# Patient Record
Sex: Female | Born: 1980 | Hispanic: Yes | Marital: Single | Smoking: Never smoker
Health system: Southern US, Community
[De-identification: ages and names within clinical notes are randomized; demographics above are authoritative.]

## PROBLEM LIST (undated history)

## (undated) DIAGNOSIS — D849 Immunodeficiency, unspecified: Secondary | ICD-10-CM

## (undated) DIAGNOSIS — K529 Noninfective gastroenteritis and colitis, unspecified: Secondary | ICD-10-CM

## (undated) DIAGNOSIS — Z800 Family history of malignant neoplasm of digestive organs: Secondary | ICD-10-CM

## (undated) DIAGNOSIS — F419 Anxiety disorder, unspecified: Secondary | ICD-10-CM

## (undated) DIAGNOSIS — R42 Dizziness and giddiness: Secondary | ICD-10-CM

## (undated) DIAGNOSIS — C801 Malignant (primary) neoplasm, unspecified: Secondary | ICD-10-CM

## (undated) DIAGNOSIS — U071 COVID-19: Secondary | ICD-10-CM

## (undated) DIAGNOSIS — C50919 Malignant neoplasm of unspecified site of unspecified female breast: Secondary | ICD-10-CM

## (undated) HISTORY — DX: Immunodeficiency, unspecified: D84.9

## (undated) HISTORY — DX: Malignant neoplasm of unspecified site of unspecified female breast: C50.919

## (undated) HISTORY — DX: Anxiety disorder, unspecified: F41.9

## (undated) HISTORY — DX: COVID-19: U07.1

## (undated) HISTORY — DX: Family history of malignant neoplasm of digestive organs: Z80.0

## (undated) HISTORY — DX: Noninfective gastroenteritis and colitis, unspecified: K52.9

---

## 2004-05-24 ENCOUNTER — Observation Stay: Payer: Self-pay

## 2004-05-24 IMAGING — US US OB US >=[ID] SNGL FETUS
1 series · 14 of 26 positions shown · non-contrast
Comparison: none

REASON FOR EXAM: Sizes Less Than  dates  Call Report [PHONE_NUMBER]
COMMENTS:

[Series 1: us ob us >=(id) sngl fetus · 0.33mm/px · 14 of 26 slices shown]
[im 1/26]
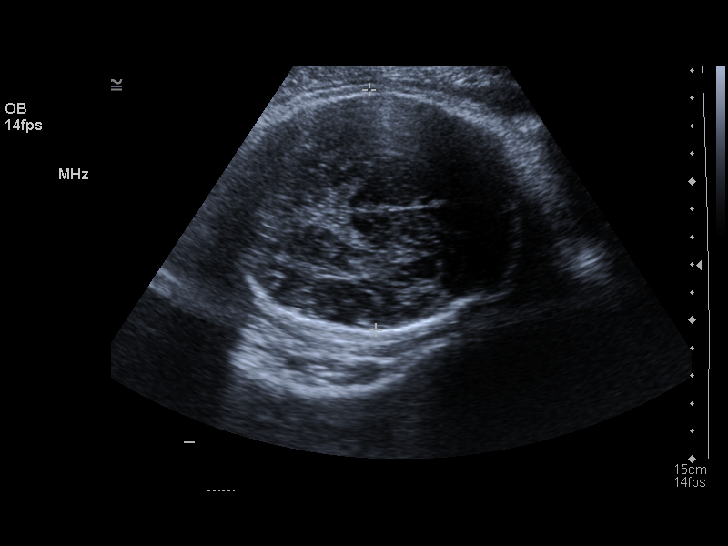
[im 3/26]
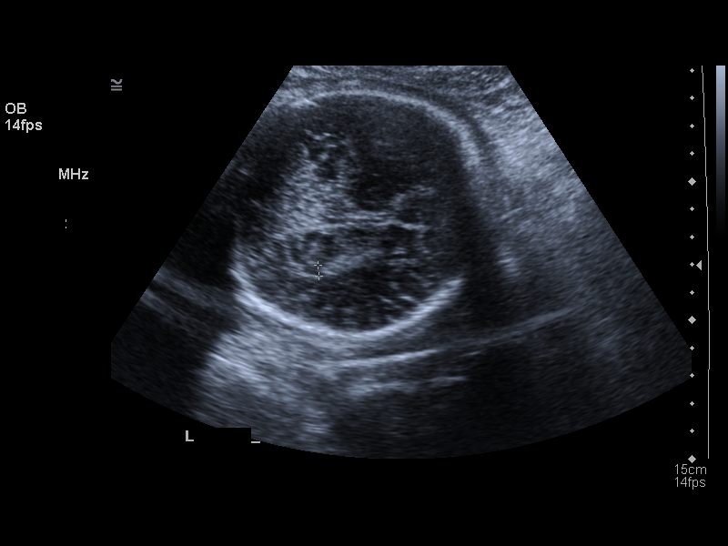
[im 5/26]
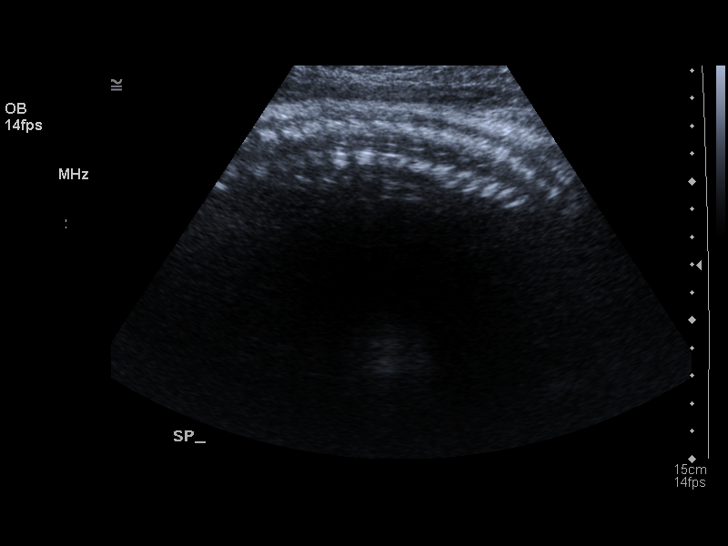
[im 7/26]
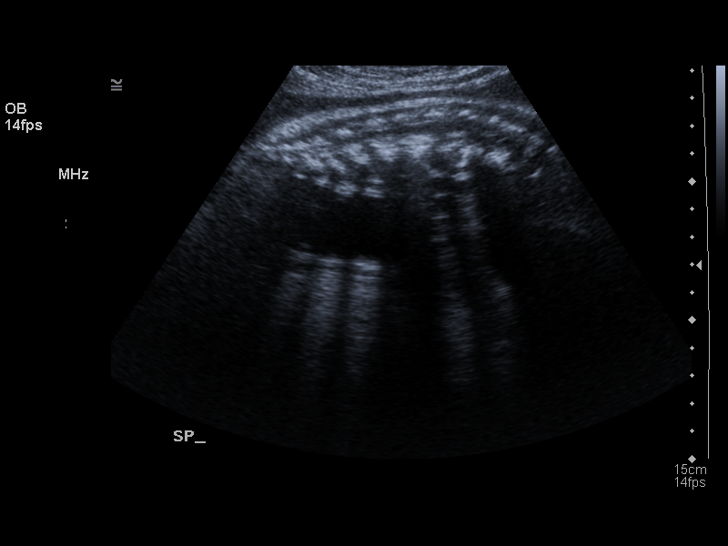
[im 9/26]
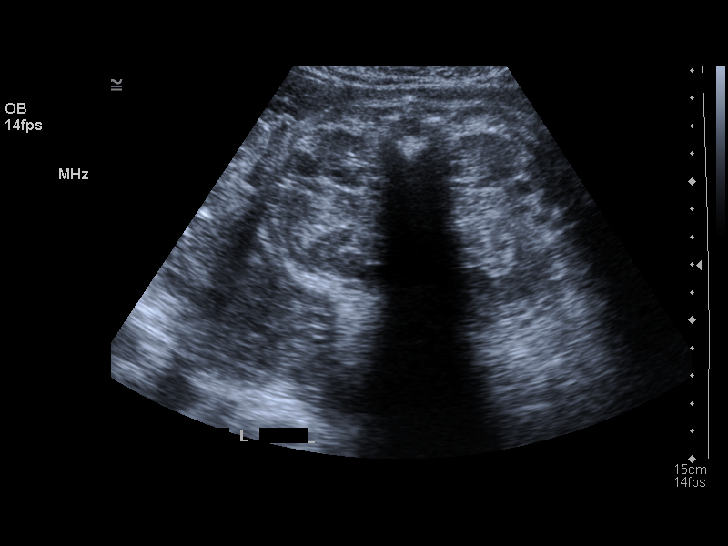
[im 11/26]
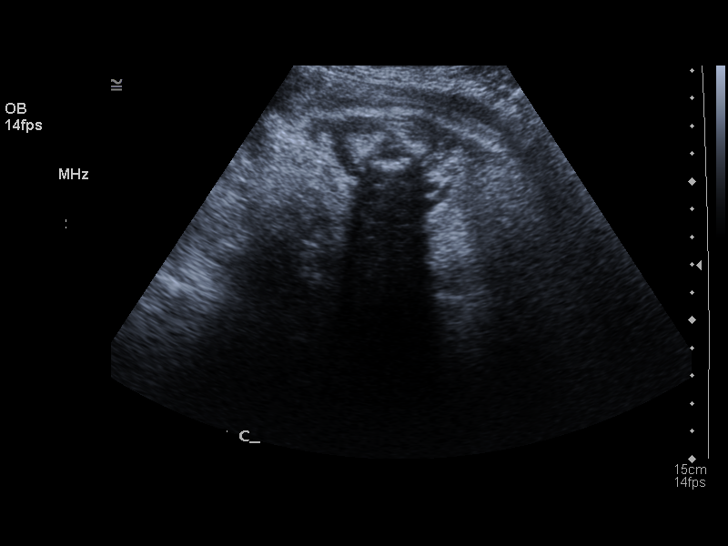
[im 13/26]
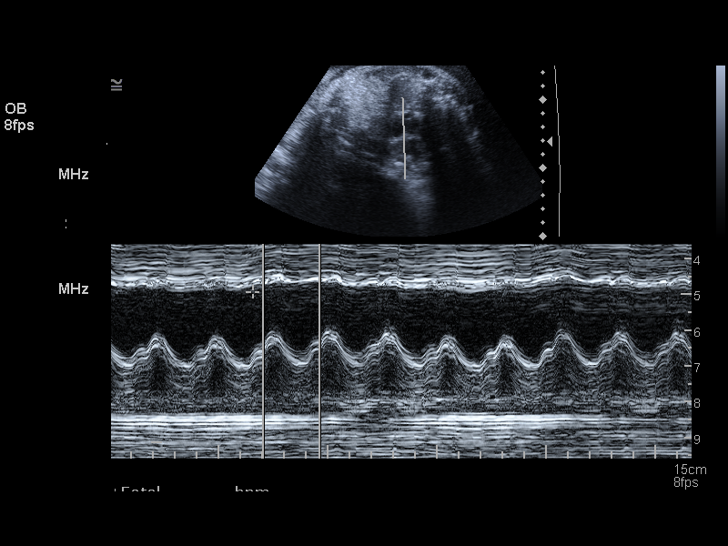
[im 14/26]
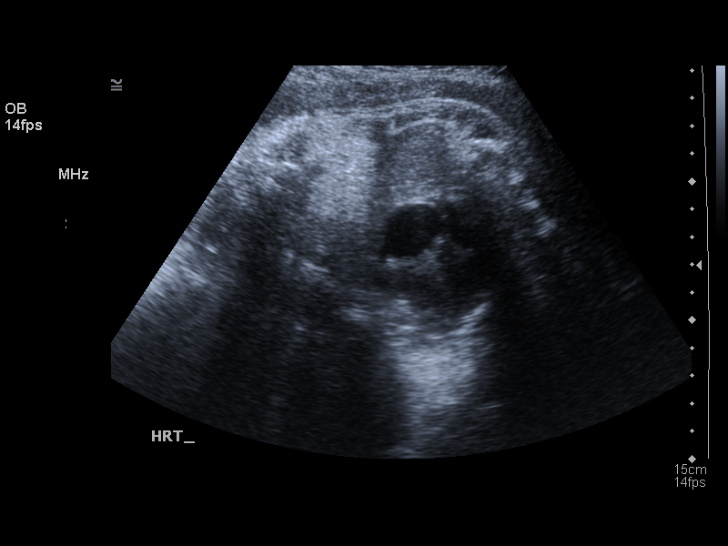
[im 16/26]
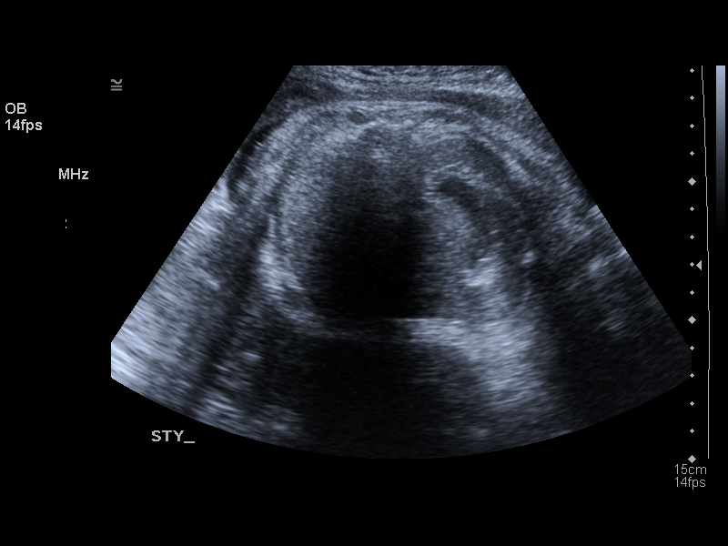
[im 18/26]
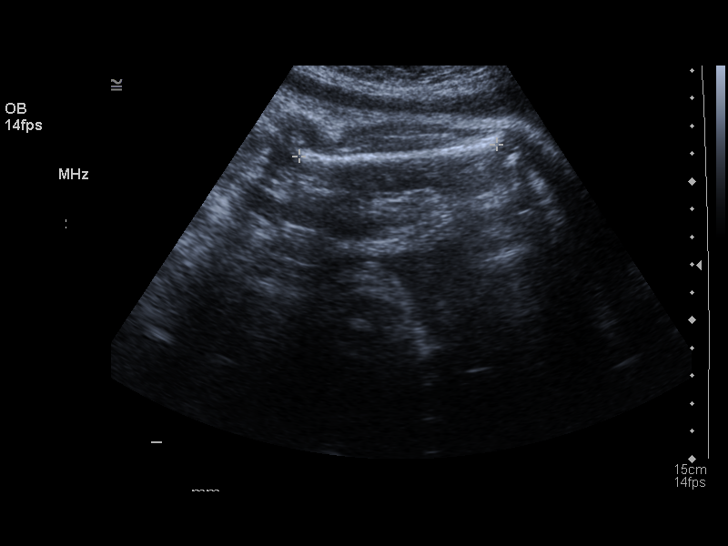
[im 20/26]
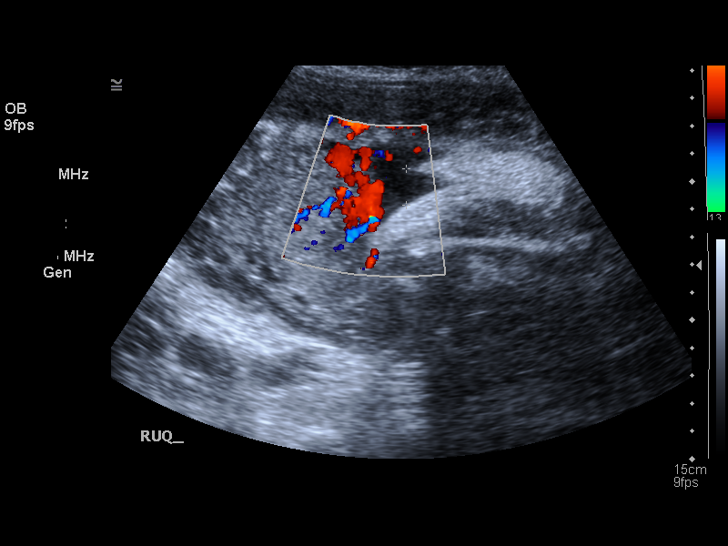
[im 22/26]
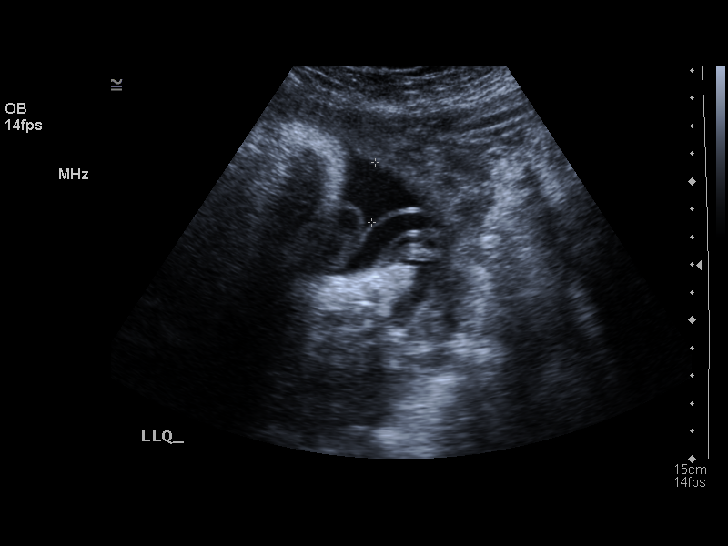
[im 24/26]
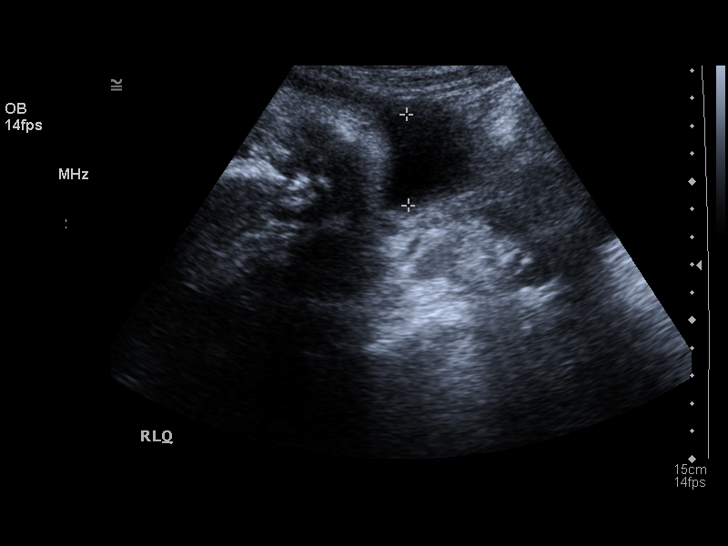
[im 26/26]
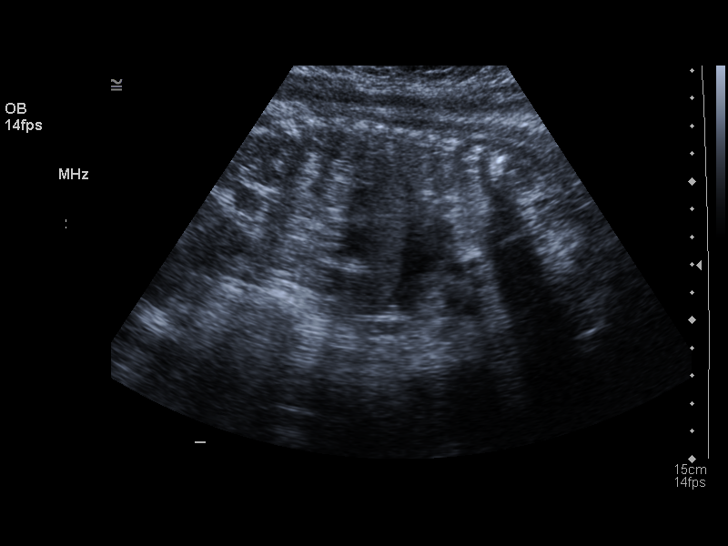

[14 of 26 positions shown; findings below may reference images not displayed]

PROCEDURE:     US  - US PREGNANCY / FETAL AGE  - [DATE]  [DATE]

RESULT:      Real-time imaging was obtained.   A single viable intrauterine
fetus in vertex presentation is identified.  Fetal cardiac motion is noted.
 Fetal heart rate of 116 beats per minute. Ultrasound age is 35 weeks 5
days.
BPD     86.7 mm     35 weeks
HC          309.5 mm     36 weeks
AC          312.6 mm     35 weeks 1 day
FL           71.3 mm     36 weeks 4 days

AFI is 10.6.  The fetal bladder, kidneys, stomach, four-chambered heart and
diaphragm are identified.  The placenta is located fundally and does not
extend to the lower uterine segment.
IMPRESSION: Single viable intrauterine fetus of 35 weeks 5 days.

## 2004-05-27 ENCOUNTER — Observation Stay: Payer: Self-pay | Admitting: Obstetrics and Gynecology

## 2004-05-28 ENCOUNTER — Inpatient Hospital Stay: Payer: Self-pay | Admitting: Obstetrics and Gynecology

## 2006-03-19 ENCOUNTER — Inpatient Hospital Stay: Payer: Self-pay | Admitting: Certified Nurse Midwife

## 2010-04-15 ENCOUNTER — Ambulatory Visit: Payer: Self-pay | Admitting: Advanced Practice Midwife

## 2010-04-15 IMAGING — US US OB US >=[ID] SNGL FETUS
1 series · 17 of 28 positions shown · non-contrast
Comparison: none

REASON FOR EXAM: dating
COMMENTS:

[Series 1: us ob us >=(id) sngl fetus · 17 of 61 slices shown]
[im 1/61]
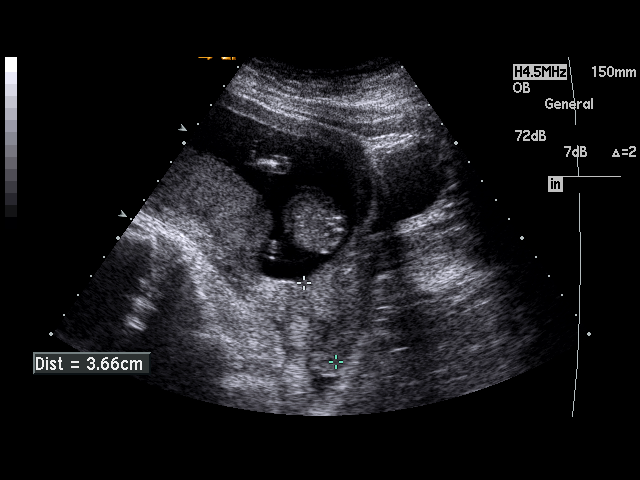
[im 5/61]
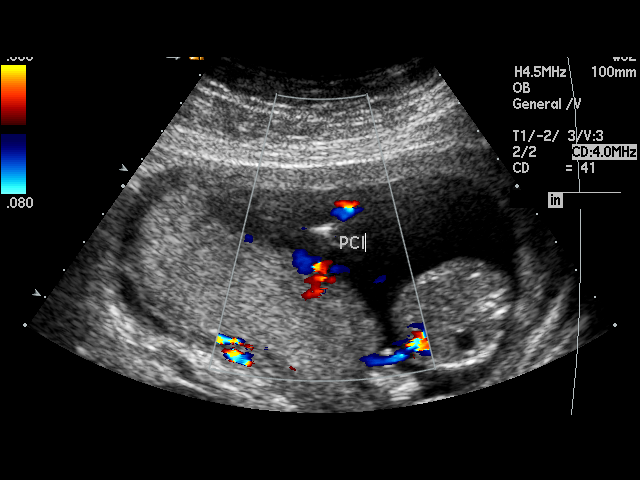
[im 9/61]
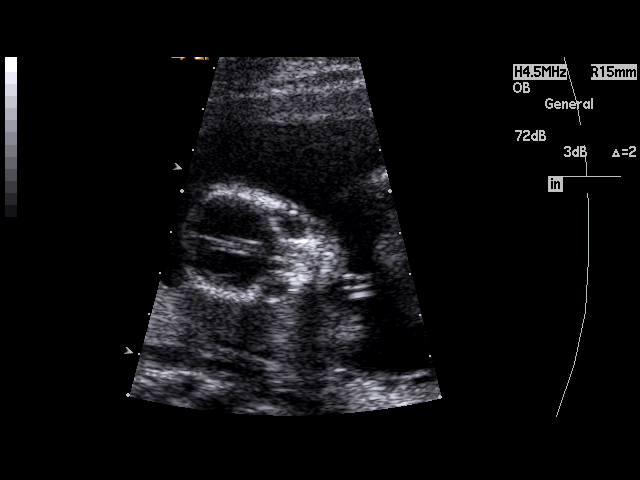
[im 12/61]
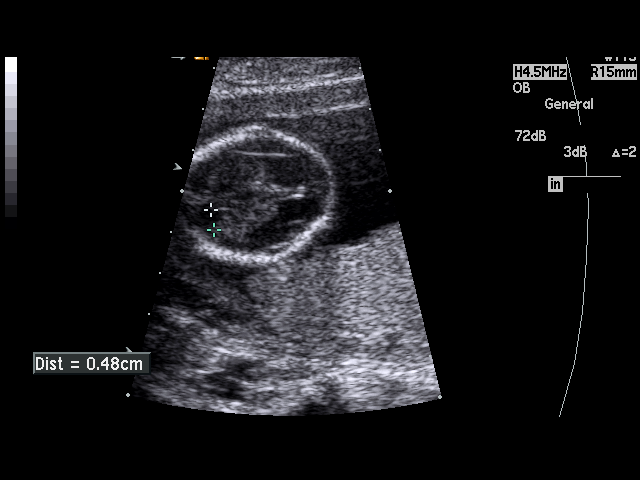
[im 16/61]
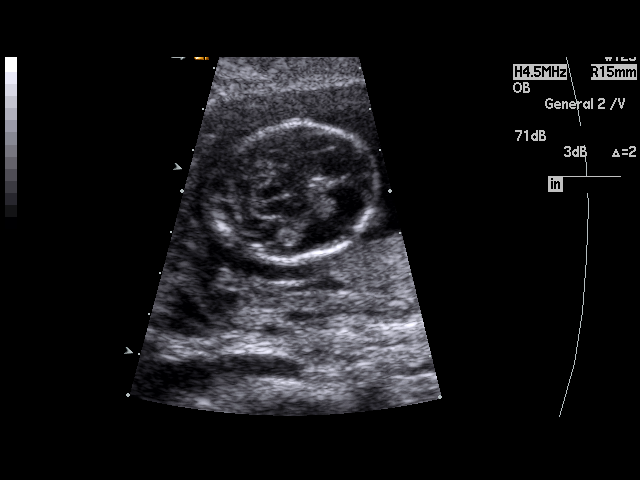
[im 21/61]
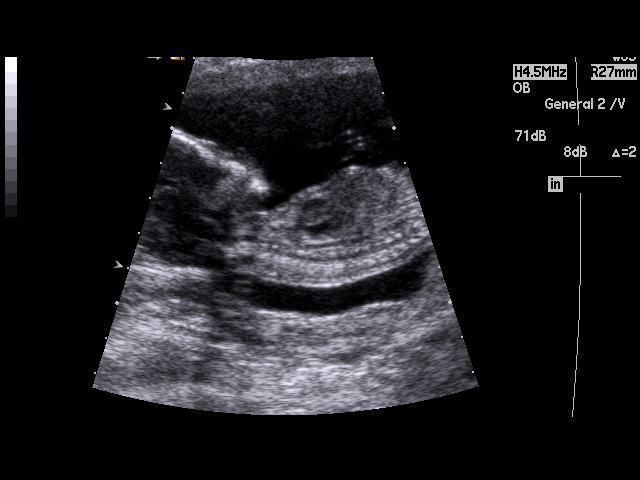
[im 23/61]
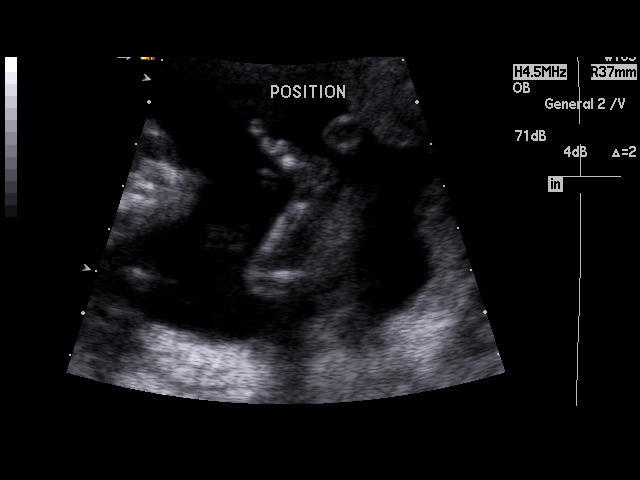
[im 27/61]
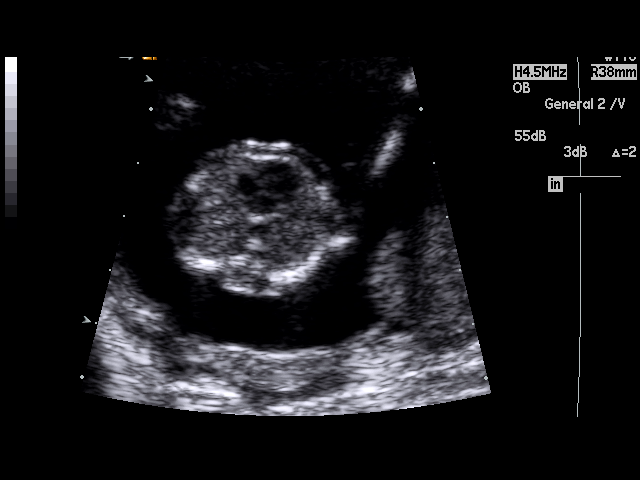
[im 32/61]
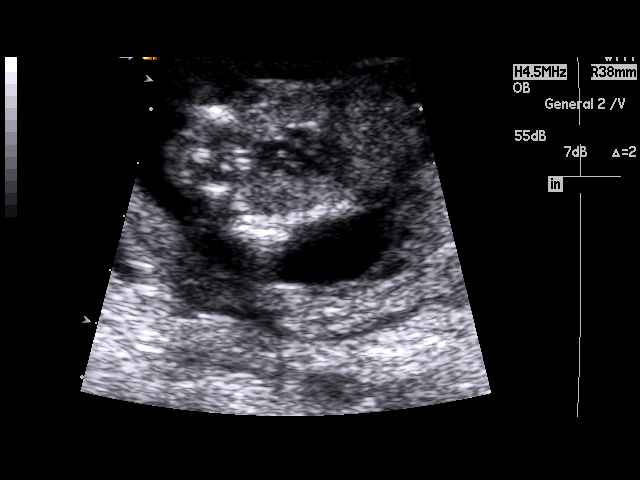
[im 34/61]
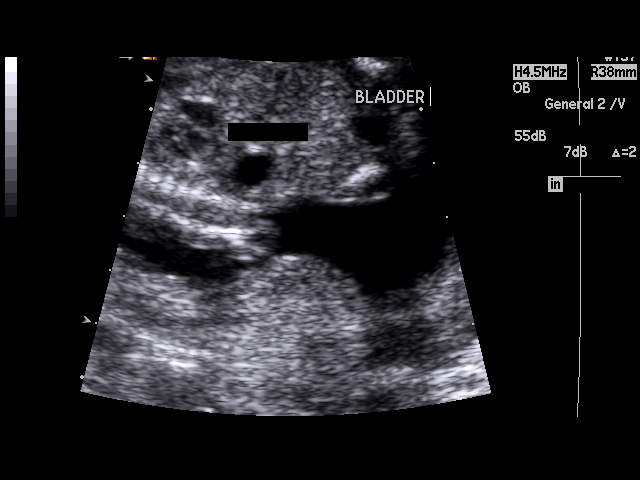
[im 38/61]
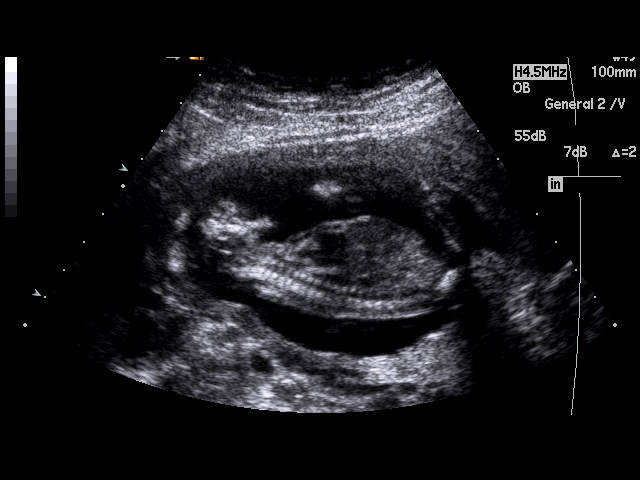
[im 41/61]
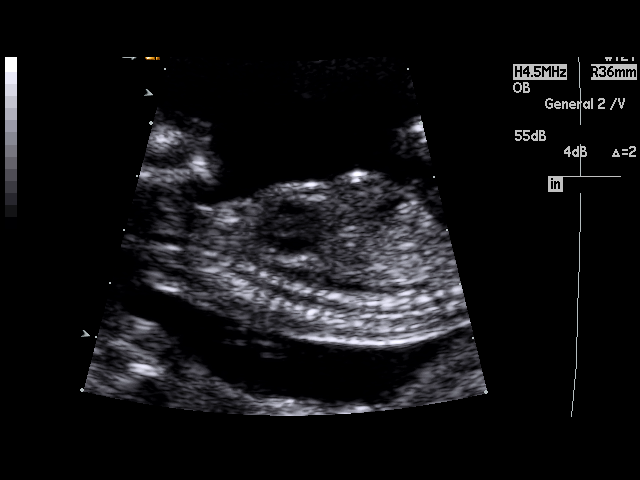
[im 45/61]
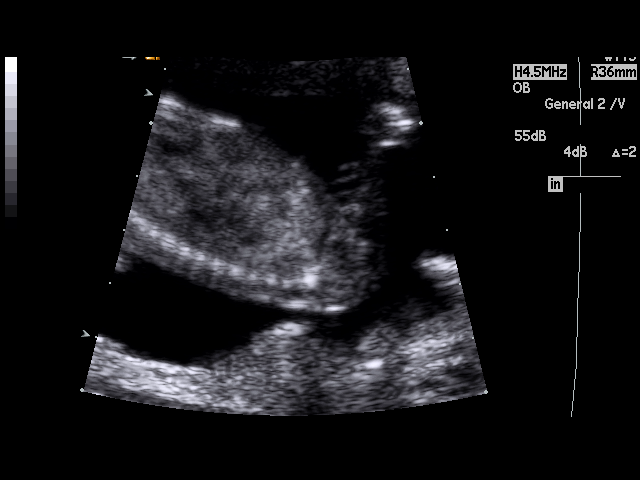
[im 49/61]
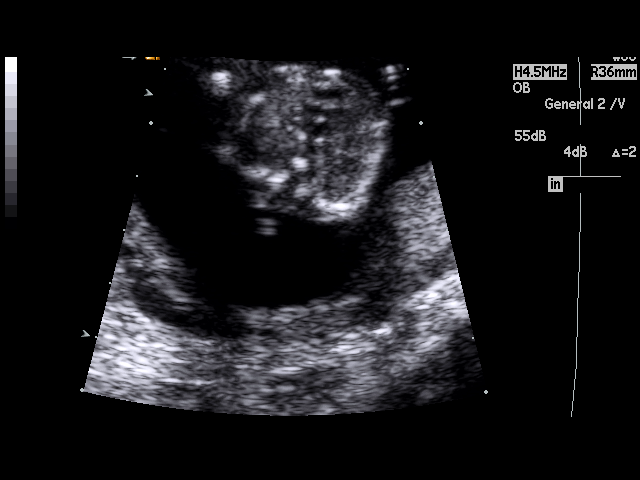
[im 52/61]
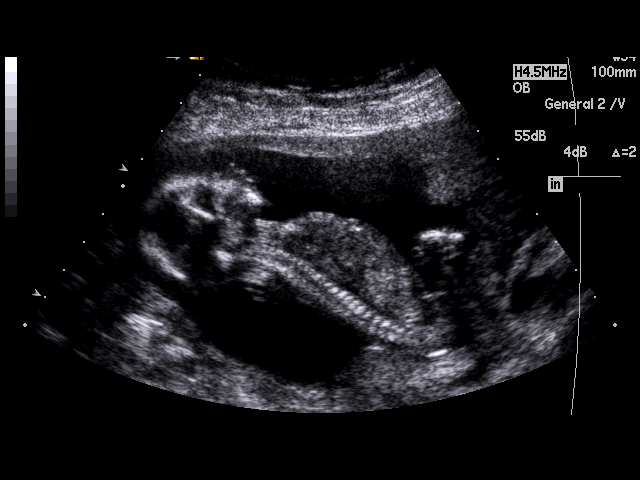
[im 56/61]
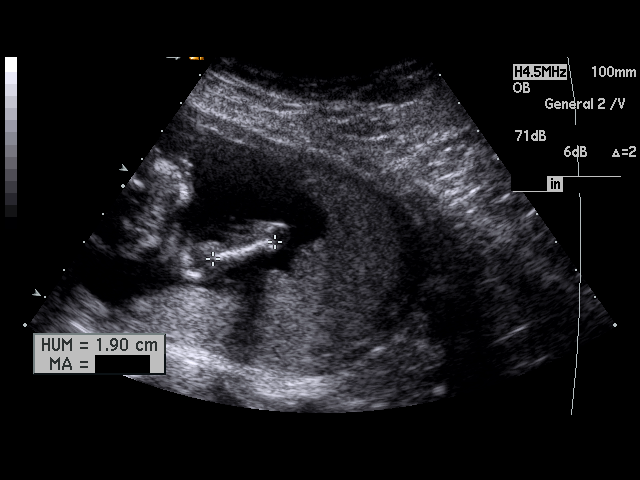
[im 61/61]
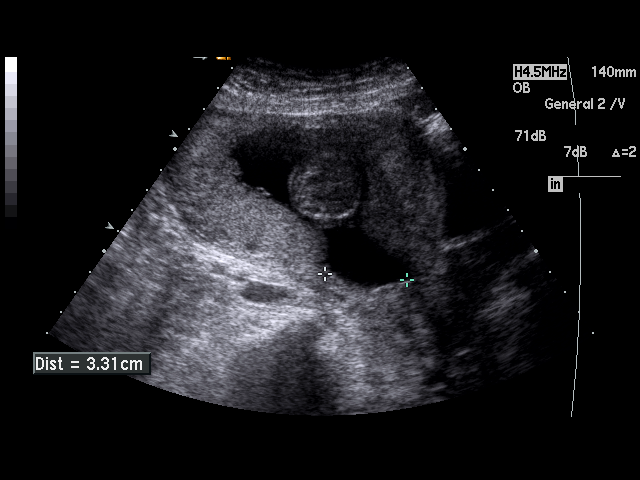

[17 of 28 positions shown; findings below may reference images not displayed]

PROCEDURE:     US  - US OB GREATER/OR EQUAL TO [RB]  - [DATE]  [DATE]

RESULT:     There is observed a single living intrauterine gestation.
Presentation is variable during the course of this exam. The placenta is
posterior. The inferior tip of the placenta is approximately 3.31 cm above
the cervix. Fetal cardiac activity was monitored at 147 beats per minute.
Amniotic fluid volume appears normal. The fetal heart, stomach and urinary
bladder are visualized. No hydrocephalus or hydronephrosis is seen. The
kidneys are too small to be evaluated at this time. Fetal measurements are
as follows:

BPD     3.24 cm      Corresponding to 16 weeks 1 day.
HC         12.34 cm      Corresponding to 16 weeks 1 day.
AC        10.17 cm      Corresponding to 16 weeks 1 day.
FL           1.8 cm      Corresponding to 15 weeks 2 days.
HL           1.9 cm      Corresponding to 15 weeks 4 days.

EFW is 257 grams + / - 21 grams. Average ultrasound age is 15 weeks 6 days.
Ultrasound EDD is [DATE].
IMPRESSION: Please see above.

## 2010-05-05 ENCOUNTER — Encounter: Payer: Self-pay | Admitting: Obstetrics & Gynecology

## 2010-05-05 IMAGING — US US OB DETAIL+14 WK - NRPT [facility]
1 series · 14 of 28 positions shown · non-contrast
Comparison: none

[Series 1: us ob detail+14 wk - nrpt [facility] · 14 of 91 slices shown]
[im 4/91]
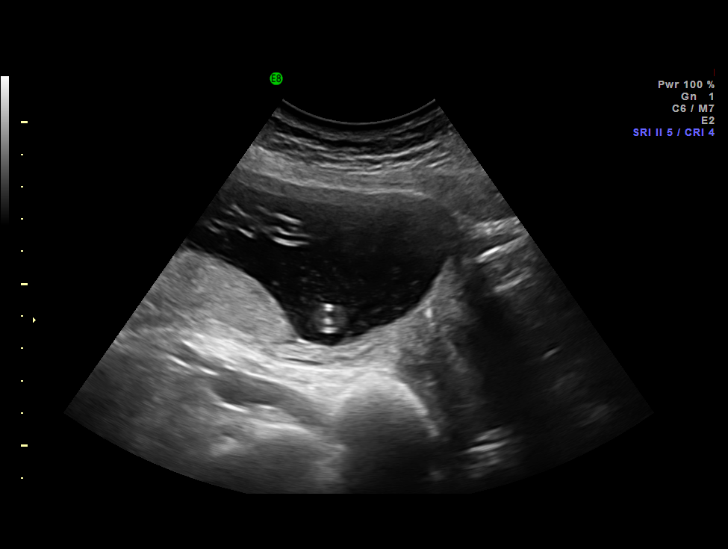
[im 11/91]
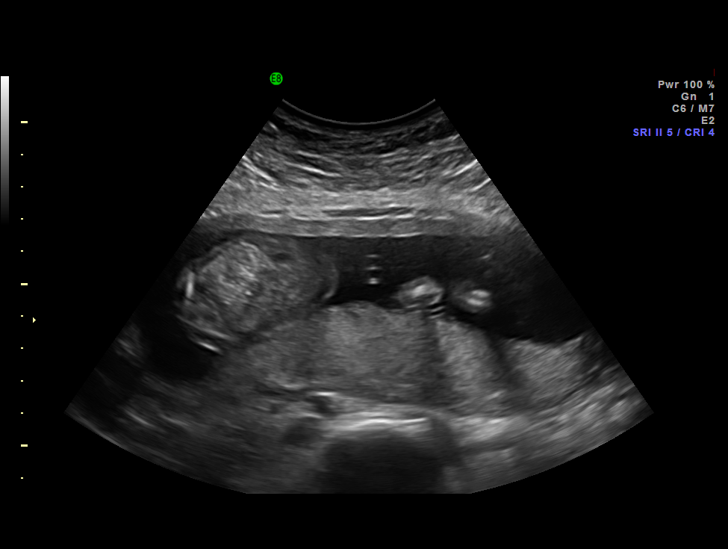
[im 17/91]
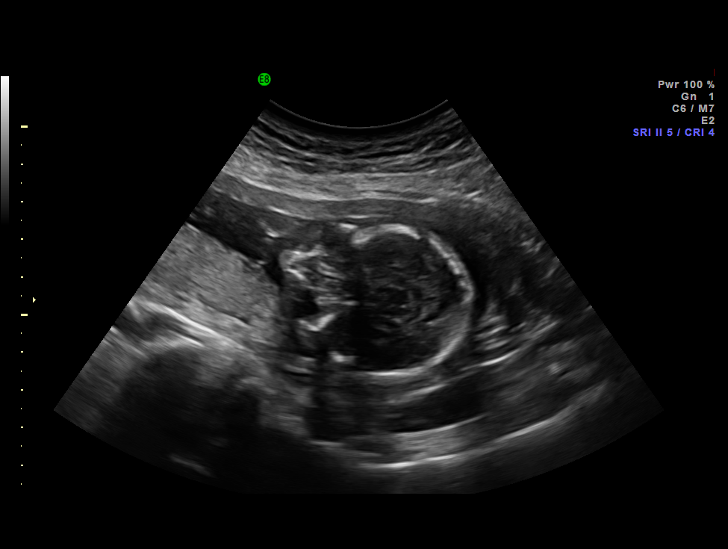
[im 24/91]
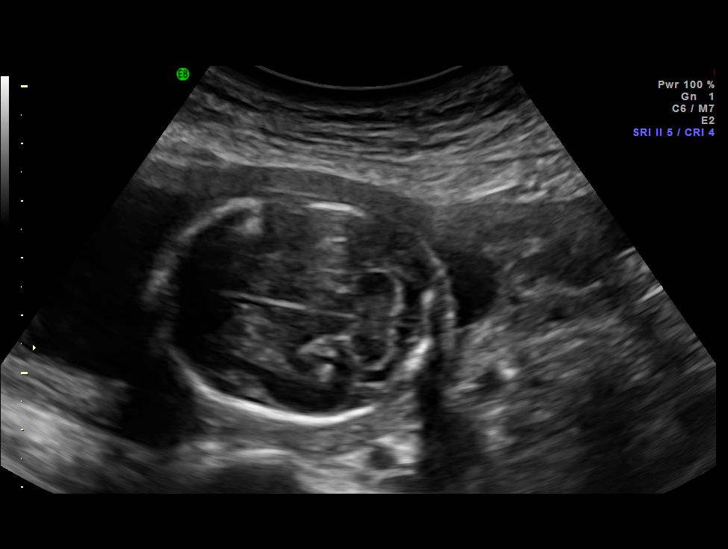
[im 31/91]
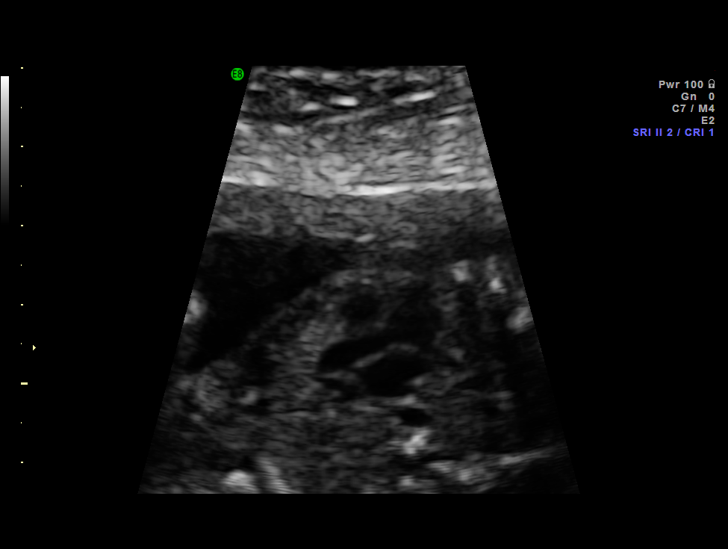
[im 37/91]
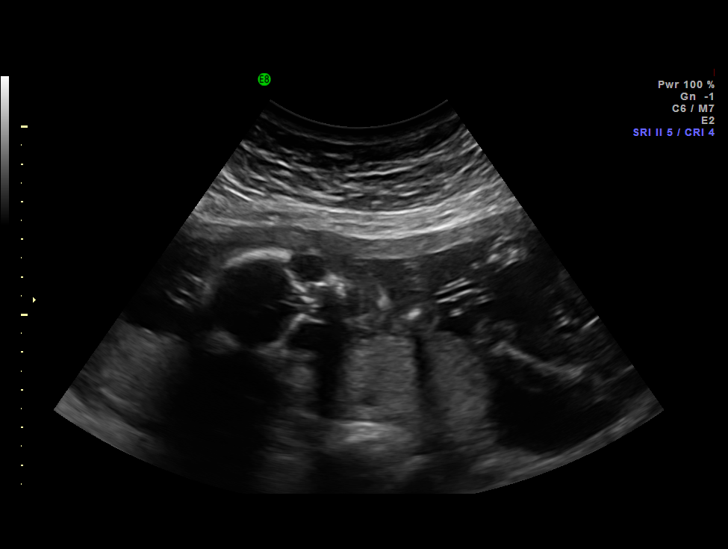
[im 44/91]
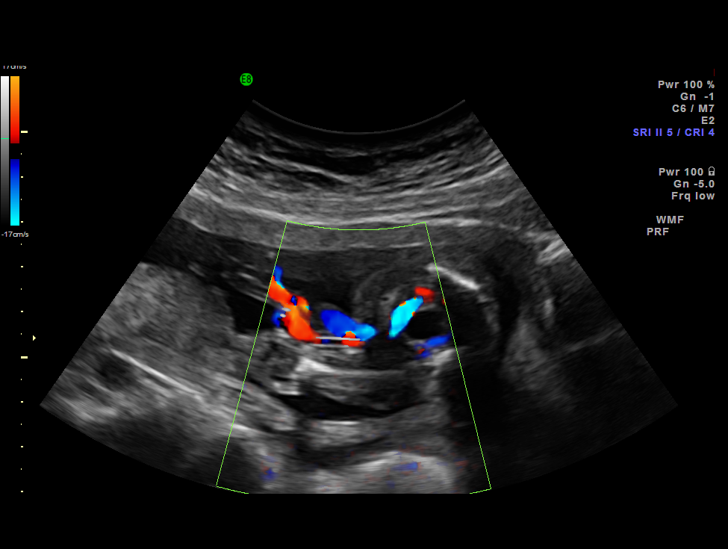
[im 51/91]
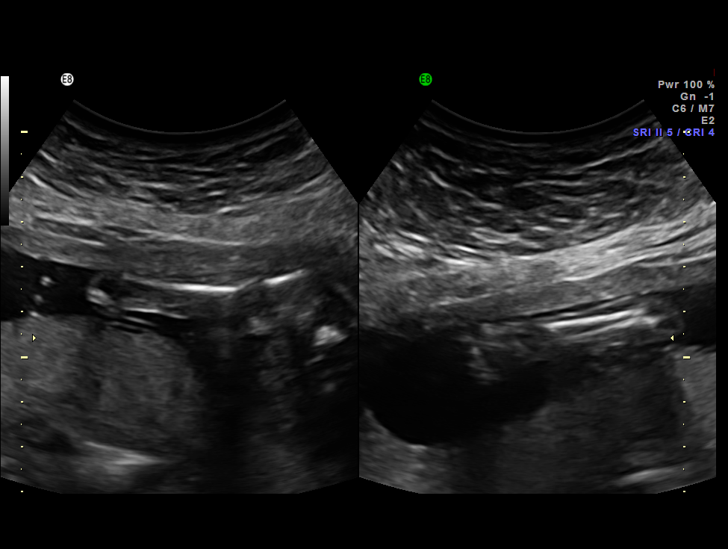
[im 57/91]
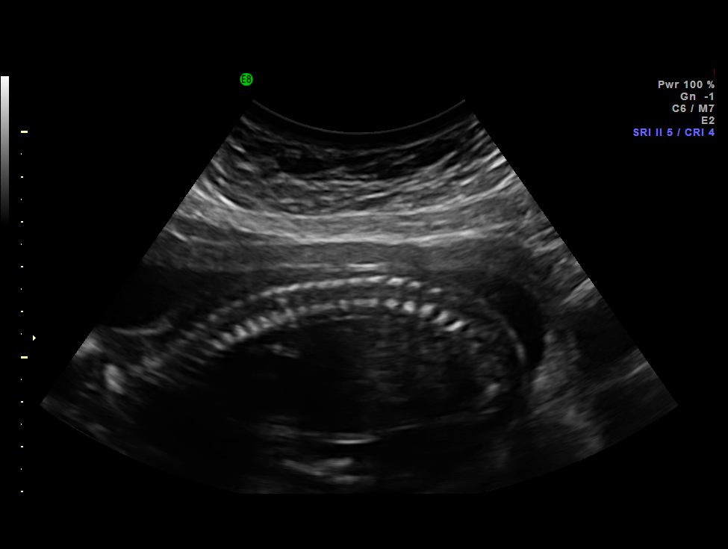
[im 64/91]
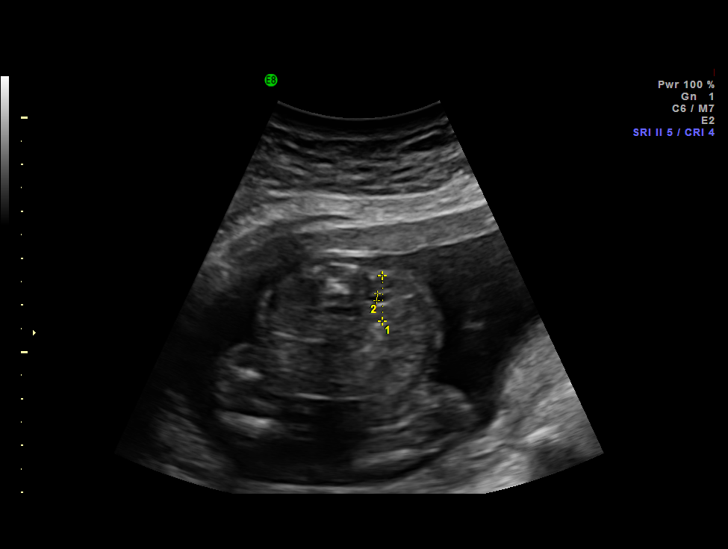
[im 71/91]
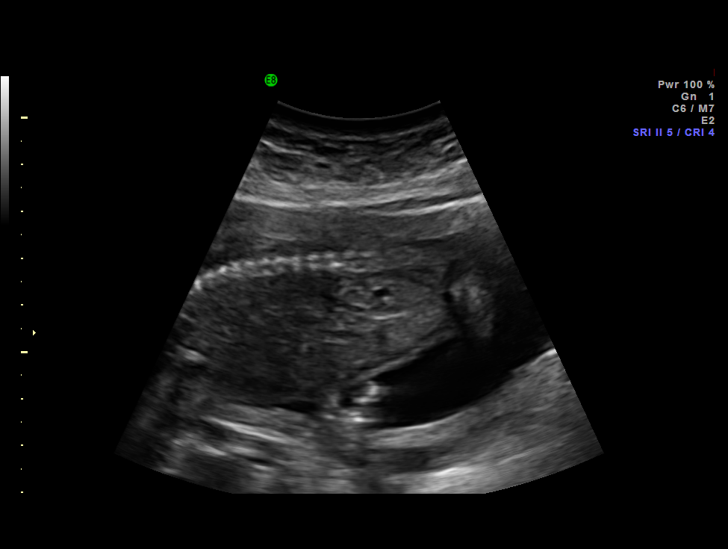
[im 77/91]
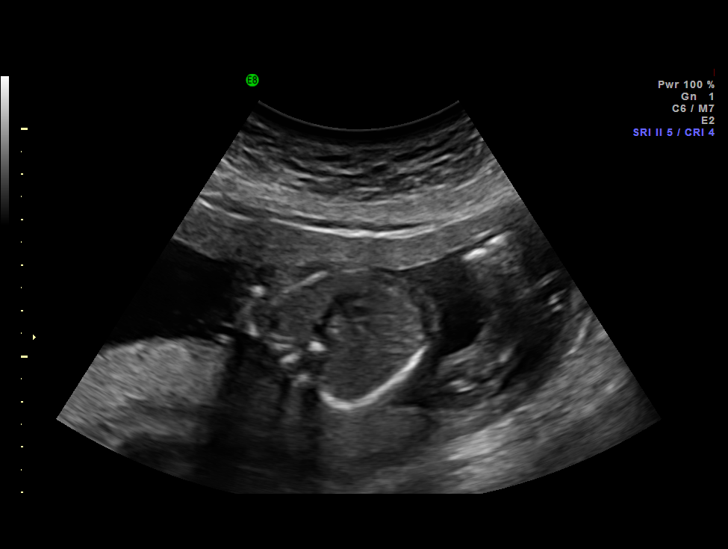
[im 84/91]
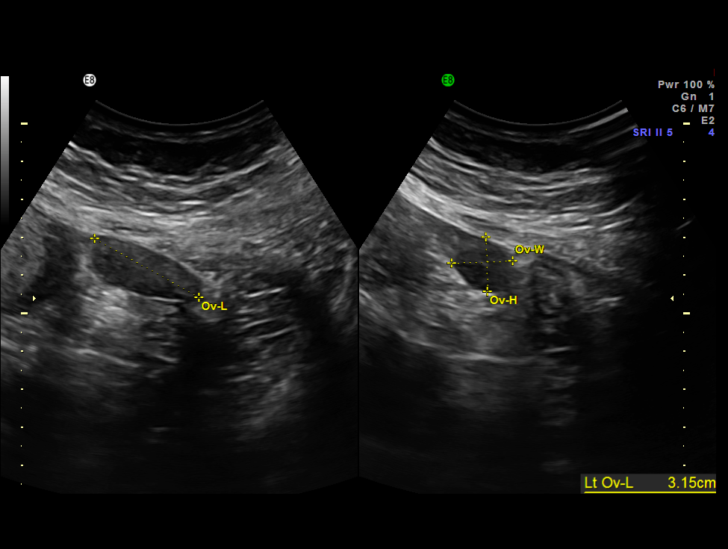
[im 91/91]
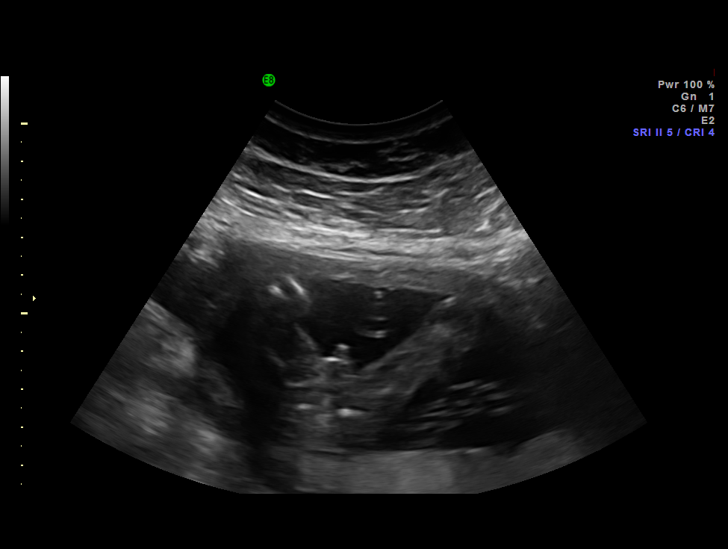

[14 of 28 positions shown; findings below may reference images not displayed]

IMAGES IMPORTED FROM THE [employer] WORKFLOW SYSTEM
NO DICTATION FOR STUDY

## 2010-09-14 ENCOUNTER — Observation Stay: Payer: Self-pay

## 2010-10-06 ENCOUNTER — Inpatient Hospital Stay: Payer: Self-pay | Admitting: Obstetrics and Gynecology

## 2015-01-04 ENCOUNTER — Other Ambulatory Visit: Payer: Self-pay

## 2015-01-04 ENCOUNTER — Emergency Department: Payer: Self-pay

## 2015-01-04 ENCOUNTER — Emergency Department
Admission: EM | Admit: 2015-01-04 | Discharge: 2015-01-04 | Disposition: A | Discharge status: Home / Self Care | Payer: Self-pay | Attending: Emergency Medicine | Admitting: Emergency Medicine

## 2015-01-04 DIAGNOSIS — K2970 Gastritis, unspecified, without bleeding: Secondary | ICD-10-CM | POA: Insufficient documentation

## 2015-01-04 DIAGNOSIS — R109 Unspecified abdominal pain: Secondary | ICD-10-CM

## 2015-01-04 DIAGNOSIS — Z3202 Encounter for pregnancy test, result negative: Secondary | ICD-10-CM | POA: Insufficient documentation

## 2015-01-04 LAB — CBC
HCT: 40.1 % (ref 35.0–47.0)
Hemoglobin: 13.7 g/dL (ref 12.0–16.0)
MCH: 30.4 pg (ref 26.0–34.0)
MCHC: 34.1 g/dL (ref 32.0–36.0)
MCV: 89.1 fL (ref 80.0–100.0)
Platelets: 288 10*3/uL (ref 150–440)
RBC: 4.5 MIL/uL (ref 3.80–5.20)
RDW: 12.9 % (ref 11.5–14.5)
WBC: 13.7 10*3/uL — ABNORMAL HIGH (ref 3.6–11.0)

## 2015-01-04 LAB — LIPASE, BLOOD: Lipase: 24 U/L (ref 22–51)

## 2015-01-04 LAB — COMPREHENSIVE METABOLIC PANEL
ALT: 18 U/L (ref 14–54)
AST: 22 U/L (ref 15–41)
Albumin: 4.1 g/dL (ref 3.5–5.0)
Alkaline Phosphatase: 74 U/L (ref 38–126)
Anion gap: 5 (ref 5–15)
BUN: 6 mg/dL (ref 6–20)
CO2: 28 mmol/L (ref 22–32)
Calcium: 9 mg/dL (ref 8.9–10.3)
Chloride: 106 mmol/L (ref 101–111)
Creatinine, Ser: 0.49 mg/dL (ref 0.44–1.00)
GFR calc Af Amer: >60 mL/min (ref >60)
GFR calc non Af Amer: >60 mL/min (ref >60)
Glucose, Bld: 116 mg/dL — ABNORMAL HIGH (ref 65–99)
Potassium: 3.6 mmol/L (ref 3.5–5.1)
Sodium: 139 mmol/L (ref 135–145)
Total Bilirubin: 0.9 mg/dL (ref 0.3–1.2)
Total Protein: 7.6 g/dL (ref 6.5–8.1)

## 2015-01-04 LAB — URINALYSIS COMPLETE WITH MICROSCOPIC (ARMC ONLY)
Bacteria, UA: NONE SEEN
Bilirubin Urine: NEGATIVE
Glucose, UA: NEGATIVE mg/dL
Hgb urine dipstick: NEGATIVE
Leukocytes, UA: NEGATIVE
Nitrite: NEGATIVE
Protein, ur: NEGATIVE mg/dL
RBC / HPF: NONE SEEN RBC/hpf (ref 0–5)
Specific Gravity, Urine: 1.012 (ref 1.005–1.030)
pH: 8 (ref 5.0–8.0)

## 2015-01-04 LAB — POCT PREGNANCY, URINE: Preg Test, Ur: NEGATIVE

## 2015-01-04 LAB — TROPONIN I: Troponin I: <0.03 ng/mL (ref <0.031)

## 2015-01-04 MED ORDER — ONDANSETRON HCL 4 MG/2ML IJ SOLN
INTRAMUSCULAR | Status: AC
  Administered 2015-01-04: 4 mg via INTRAVENOUS
  Filled 2015-01-04: qty 2

## 2015-01-04 MED ORDER — PANTOPRAZOLE SODIUM 20 MG PO TBEC: 20.0000 mg | DELAYED_RELEASE_TABLET | Freq: Every day | ORAL | Status: DC

## 2015-01-04 MED ORDER — GI COCKTAIL ~~LOC~~
30.0000 mL | Freq: Once | ORAL | Status: AC
  Administered 2015-01-04: 30 mL via ORAL
  Filled 2015-01-04: qty 30

## 2015-01-04 MED ORDER — SODIUM CHLORIDE 0.9 % IV BOLUS (SEPSIS)
1000.0000 mL | Freq: Once | INTRAVENOUS | Status: AC
  Administered 2015-01-04: 1000 mL via INTRAVENOUS

## 2015-01-04 MED ORDER — HYDROCODONE-ACETAMINOPHEN 5-325 MG PO TABS: 1.0000 | ORAL_TABLET | ORAL | Status: DC | PRN

## 2015-01-04 MED ORDER — ONDANSETRON HCL 4 MG/2ML IJ SOLN
4.0000 mg | Freq: Once | INTRAMUSCULAR | Status: AC | PRN
  Administered 2015-01-04: 4 mg via INTRAVENOUS

## 2015-01-04 NOTE — ED Provider Notes (Signed)
[facility] Emergency Department Provider Note  ____________________________________________  Time seen:  7 AM  I have reviewed the triage vital signs and the nursing notes.   HISTORY  Chief Complaint Abdominal Pain    HPI Sophia [name] is a 34 y.o. female who complains of epigastric discomfortwhich started yesterday and has been constant. She describes it as a burning cramping sensation. She does report some nausea and vomiting. She reports the pain as mild to moderate. She denies fevers chills. No diarrhea. No history of the same. No history of abdominal surgeries. No sick contacts. No history of stomach ulcers     No past medical history on file.  There are no active problems to display for this patient.   No past surgical history on file.  No current outpatient prescriptions on file.  Allergies Review of patient's allergies indicates no known allergies.  No family history on file.  Social History Social History  Substance Use Topics  . Smoking status: Not on file  . Smokeless tobacco: Not on file  . Alcohol Use: Not on file   denies alcohol use, denies smoking  Review of Systems  Constitutional: Negative for fever. Eyes: Negative for visual changes. ENT: Negative for sore throat Cardiovascular: Negative for chest pain. Respiratory: Negative for shortness of breath. Gastrointestinal: Positive for abdominal pain and nausea Genitourinary: Negative for dysuria. Musculoskeletal: Negative for back pain. Skin: Negative for rash. Neurological: Negative for headaches or focal weakness Psychiatric: No anxiety    ____________________________________________   PHYSICAL EXAM:  VITAL SIGNS: ED Triage Vitals  Enc Vitals Group     BP 01/04/15 0453 122/70 mmHg     Pulse Rate 01/04/15 0453 68     Resp 01/04/15 0453 20     Temp 01/04/15 0453 98.2 F (36.8 C)     Temp Source 01/04/15 0453 Oral     SpO2 01/04/15 0453 100 %   Weight 01/04/15 0453 125 lb (56.7 kg)     Height 01/04/15 0453 5\' 4"  (1.626 m)     Head Cir --      Peak Flow --      Pain Score 01/04/15 0454 10     Pain Loc --      Pain Edu? --      Excl. in [place]? --      Constitutional: Alert and oriented. Well appearing and in no distress. Eyes: Conjunctivae are normal.  ENT   Head: Normocephalic and atraumatic.   Mouth/Throat: Mucous membranes are moist. Cardiovascular: Normal rate, regular rhythm. Normal and symmetric distal pulses are present in all extremities. No murmurs, rubs, or gallops. Respiratory: Normal respiratory effort without tachypnea nor retractions. Breath sounds are clear and equal bilaterally.  Gastrointestinal: Mild tenderness to palpation over the epigastrium. No distention. There is no CVA tenderness. Genitourinary: deferred Musculoskeletal: Nontender with normal range of motion in all extremities. No lower extremity tenderness nor edema. Neurologic:  Normal speech and language. No gross focal neurologic deficits are appreciated. Skin:  Skin is warm, dry and intact. No rash noted. Psychiatric: Mood and affect are normal. Patient exhibits appropriate insight and judgment.  ____________________________________________    LABS (pertinent positives/negatives)  Labs Reviewed  COMPREHENSIVE METABOLIC PANEL - Abnormal; Notable for the following:    Glucose, Bld 116 (*)    All other components within normal limits  CBC - Abnormal; Notable for the following:    WBC 13.7 (*)    All other components within normal limits  URINALYSIS COMPLETEWITH MICROSCOPIC (  [facility] ONLY) - Abnormal; Notable for the following:    Color, Urine YELLOW (*)    APPearance CLEAR (*)    Ketones, ur TRACE (*)    Squamous Epithelial / LPF 0-5 (*)    All other components within normal limits  LIPASE, BLOOD  TROPONIN I  POC URINE PREG, ED  POCT PREGNANCY, URINE    ____________________________________________   EKG ED ECG REPORT I, Lavonia Drafts, the attending physician, personally viewed and interpreted this ECG.  Date: 01/04/2015 EKG Time: 7:44 AM Rate: 54 Rhythm: Sinus bradycardia QRS Axis: normal Intervals: normal ST/T Wave abnormalities: normal Conduction Disutrbances: none Narrative Interpretation: unremarkable   ____________________________________________    RADIOLOGY I have personally reviewed any xrays that were ordered on this patient: Ultrasound unremarkable  ____________________________________________   PROCEDURES  Procedure(s) performed: none  Critical Care performed: none  ____________________________________________   INITIAL IMPRESSION / ASSESSMENT AND PLAN / ED COURSE  Pertinent labs & imaging results that were available during my care of the patient were reviewed by me and considered in my medical decision making (see chart for details).  Patient with mild mid epigastric discomfort. Her ultrasound of the gallbladder is normal area she did improve significantly with the GI cocktail. I suspect gastritis or peptic ulcer disease. Her labs are unremarkable besides a mild nonspecific elevation in her white blood cell count. She has no tenderness to palpation of the abdomen on reexam . I discussed with her need for follow-up with her PCP or to return to the emergency department if her pain returns or worsens. I'll prescribe her a PPI.  ____________________________________________   FINAL CLINICAL IMPRESSION(S) / ED DIAGNOSES  Final diagnoses:  Abdominal pain     Lavonia Drafts, MD 01/04/15 [phone]

## 2015-01-04 NOTE — Discharge Instructions (Signed)
Dolor abdominal en adultos °(Abdominal Pain, Adult) °El dolor puede tener muchas causas. Normalmente la causa del dolor abdominal no es una enfermedad y mejorará sin tratamiento. Frecuentemente puede controlarse y tratarse en casa. Su médico le realizará un examen físico y posiblemente solicite análisis de sangre y radiografías para ayudar a determinar la gravedad de su dolor. Sin embargo, en muchos casos, debe transcurrir más tiempo antes de que se pueda encontrar una causa evidente del dolor. Antes de llegar a ese punto, es posible que su médico no sepa si necesita más pruebas o un tratamiento más profundo. °INSTRUCCIONES PARA EL CUIDADO EN EL HOGAR  °Esté atento al dolor para ver si hay cambios. Las siguientes indicaciones ayudarán a aliviar cualquier molestia que pueda sentir: °· Tome solo medicamentos de venta libre o recetados, según las indicaciones del médico. °· No tome laxantes a menos que se lo haya indicado su médico. °· Pruebe con una dieta líquida absoluta (caldo, té o agua) según se lo indique su médico. Introduzca gradualmente una dieta normal, según su tolerancia. °SOLICITE ATENCIÓN MÉDICA SI: °· Tiene dolor abdominal sin explicación. °· Tiene dolor abdominal relacionado con náuseas o diarrea. °· Tiene dolor cuando orina o defeca. °· Experimenta dolor abdominal que lo despierta de noche. °· Tiene dolor abdominal que empeora o mejora cuando come alimentos. °· Tiene dolor abdominal que empeora cuando come alimentos grasosos. °· Tiene fiebre. °SOLICITE ATENCIÓN MÉDICA DE INMEDIATO SI:  °· El dolor no desaparece en un plazo máximo de 2 horas. °· No deja de (vomitar). °· El dolor se siente solo en partes del abdomen, como el lado derecho o la parte inferior izquierda del abdomen. °· Evacúa materia fecal sanguinolenta o negra, de aspecto alquitranado. °ASEGÚRESE DE QUE: °· Comprende estas instrucciones. °· Controlará su afección. °· Recibirá ayuda de inmediato si no mejora o si empeora. °  °Esta  información no tiene como fin reemplazar el consejo del médico. Asegúrese de hacerle al médico cualquier pregunta que tenga. °  °Document Released: 03/06/2005 Document Revised: 03/27/2014 °Elsevier Interactive Patient Education ©2016 [employer] ° °

## 2015-01-04 NOTE — ED Notes (Signed)
Patient being discharged to home. Patient received discharge instructions and she has no questions or concerns at this time. She asks to walk out to car, will accompany her to exit.

## 2015-01-04 NOTE — ED Notes (Signed)
Patient reports mid abdominal pain (points just above umbilicus) since yesterday afternoon with nausea and vomiting (last approximately 4 hours pta).

## 2016-01-14 IMAGING — US US ABDOMEN LIMITED
1 series · 14 of 25 positions shown · non-contrast
Comparison: None.

CLINICAL DATA: Epigastric pain.

EXAM:
US ABDOMEN LIMITED - RIGHT UPPER QUADRANT

[Series 1: us abdomen limited · 0.19mm/px · 14 of 38 slices shown]
[im 1/38]
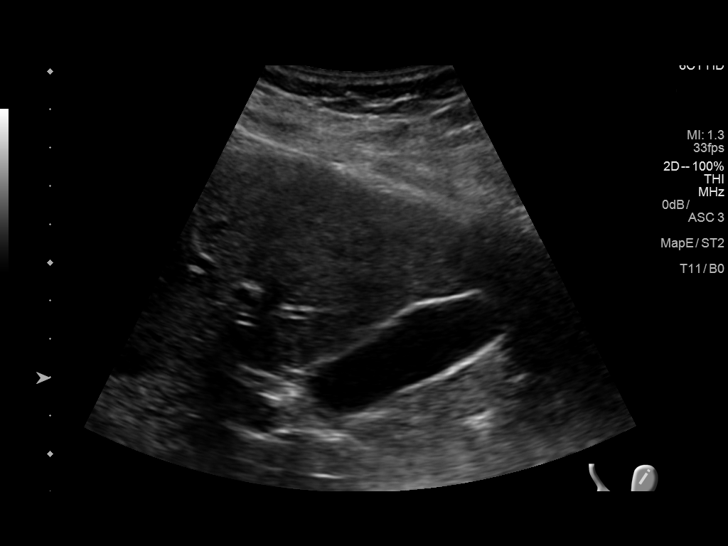
[im 4/38]
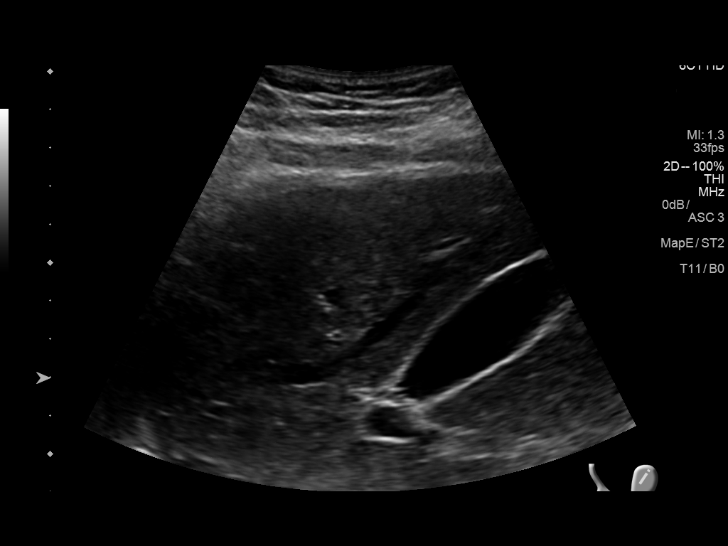
[im 7/38]
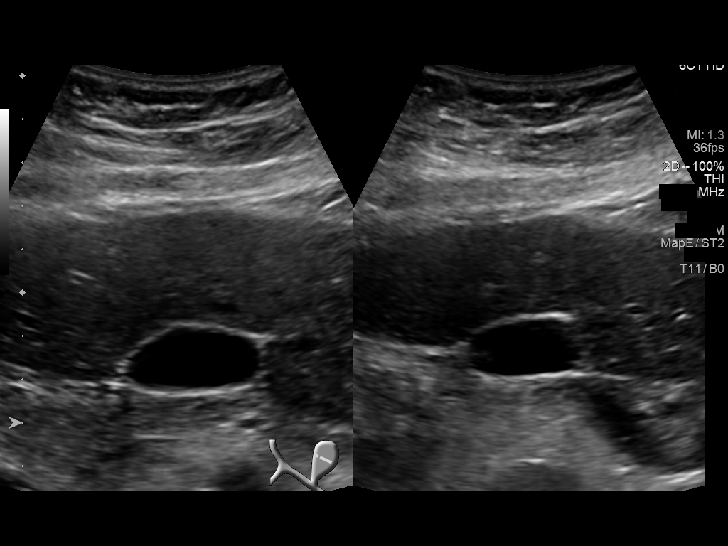
[im 10/38]
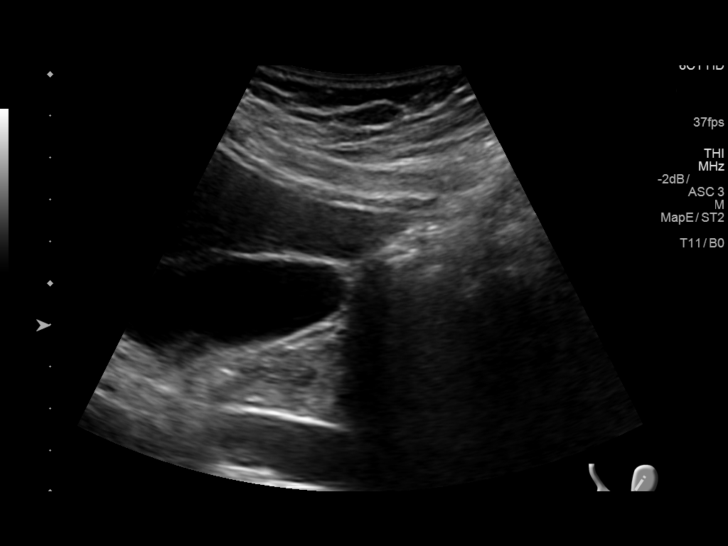
[im 13/38]
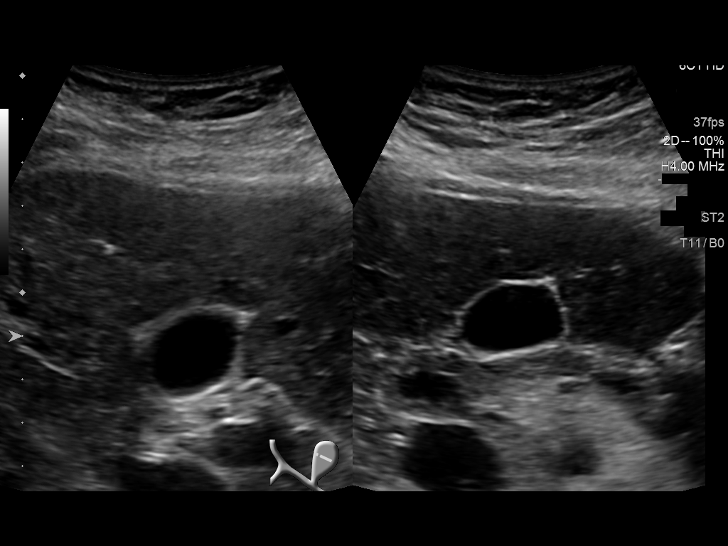
[im 14/38]
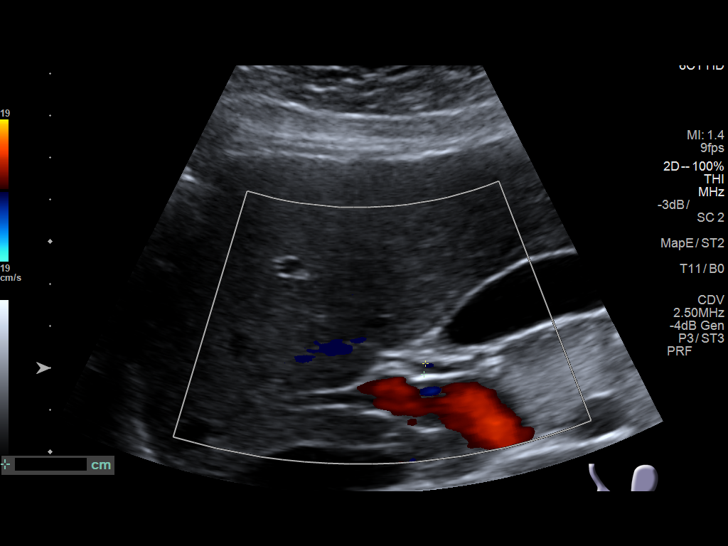
[im 17/38]
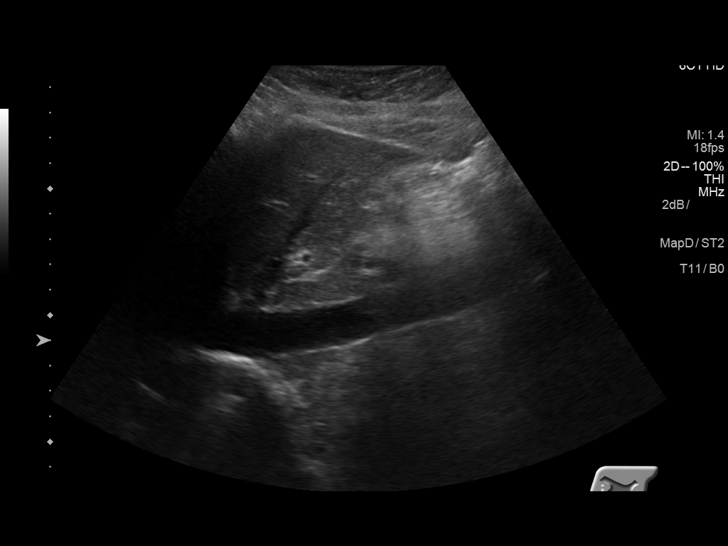
[im 21/38]
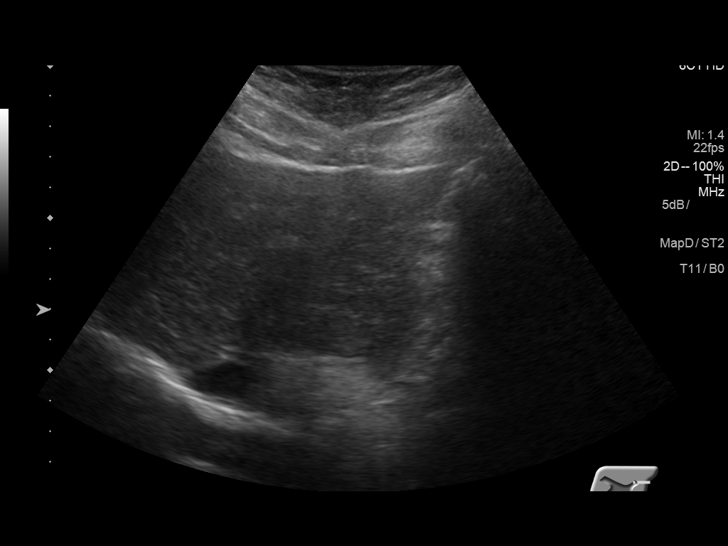
[im 24/38]
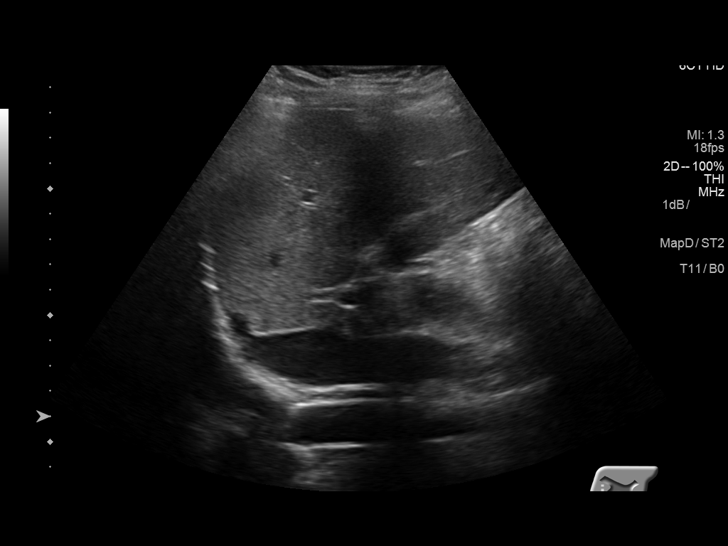
[im 25/38]
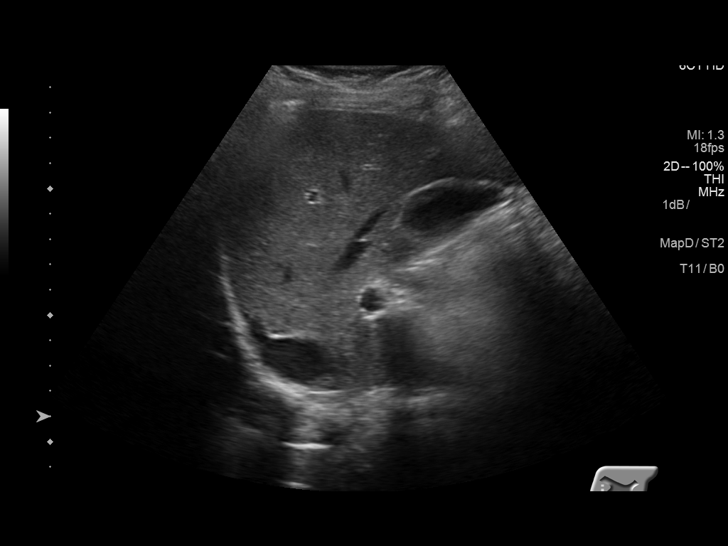
[im 28/38]
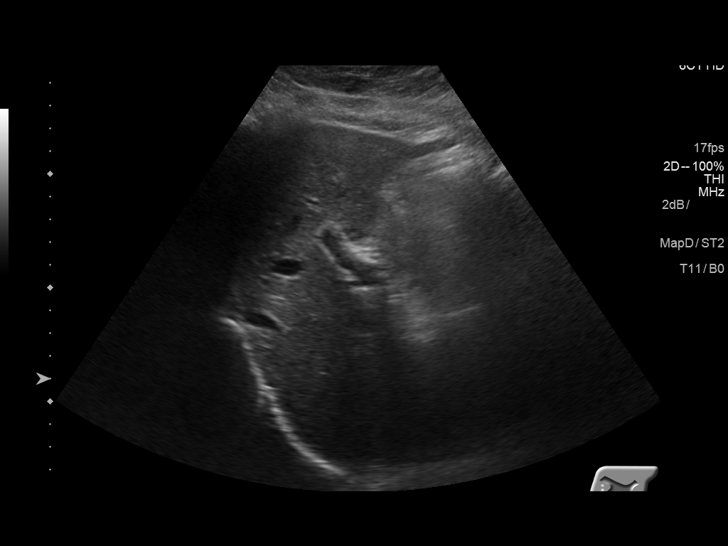
[im 31/38]
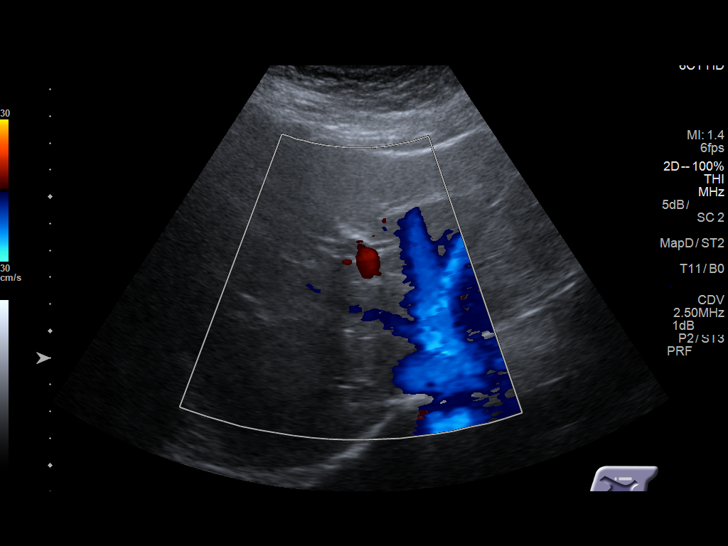
[im 34/38]
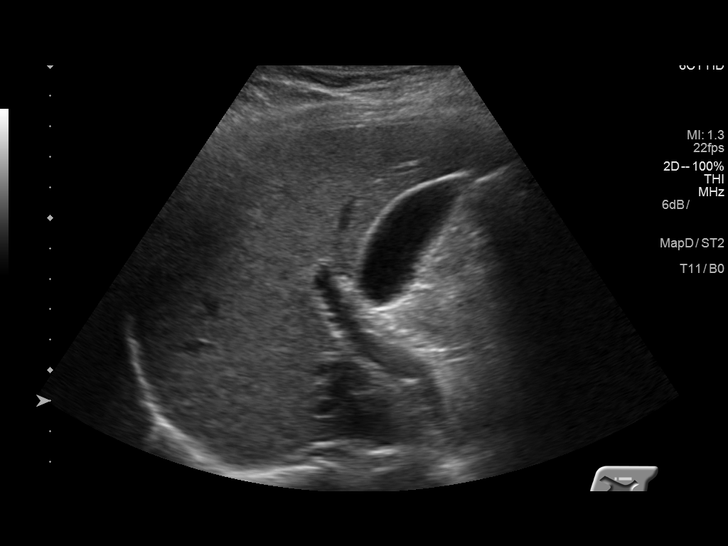
[im 38/38]
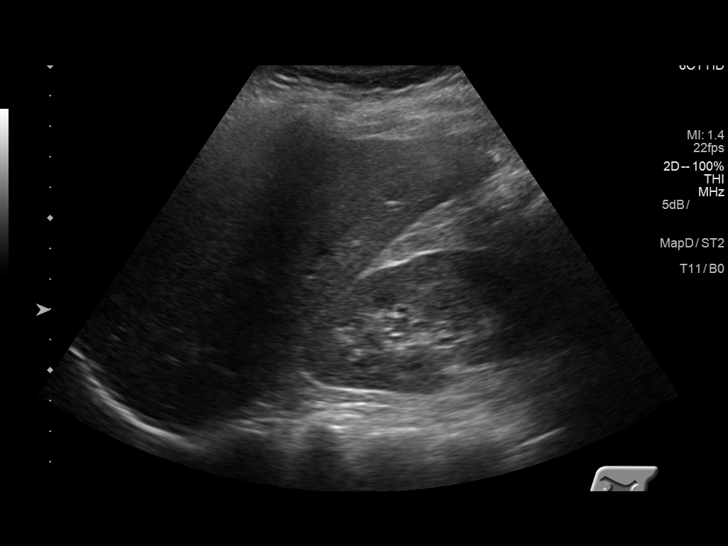

[14 of 25 positions shown; findings below may reference images not displayed]

FINDINGS: Gallbladder:

No gallstones or wall thickening visualized. No sonographic Murphy
sign noted.

Common bile duct:

Diameter: 3.0 mm

Liver:

No focal lesion identified. Within normal limits in parenchymal
echogenicity.
IMPRESSION: Negative exam.

## 2018-07-23 ENCOUNTER — Other Ambulatory Visit: Payer: Self-pay

## 2018-07-23 ENCOUNTER — Emergency Department
Admission: EM | Admit: 2018-07-23 | Discharge: 2018-07-23 | Disposition: A | Discharge status: Home / Self Care | Payer: Self-pay | Attending: Emergency Medicine | Admitting: Emergency Medicine

## 2018-07-23 ENCOUNTER — Emergency Department: Payer: Self-pay

## 2018-07-23 DIAGNOSIS — Y990 Civilian activity done for income or pay: Secondary | ICD-10-CM | POA: Insufficient documentation

## 2018-07-23 DIAGNOSIS — S62663A Nondisplaced fracture of distal phalanx of left middle finger, initial encounter for closed fracture: Secondary | ICD-10-CM | POA: Insufficient documentation

## 2018-07-23 DIAGNOSIS — Y929 Unspecified place or not applicable: Secondary | ICD-10-CM | POA: Insufficient documentation

## 2018-07-23 DIAGNOSIS — W228XXA Striking against or struck by other objects, initial encounter: Secondary | ICD-10-CM | POA: Insufficient documentation

## 2018-07-23 DIAGNOSIS — Y9389 Activity, other specified: Secondary | ICD-10-CM | POA: Insufficient documentation

## 2018-07-23 IMAGING — DX LEFT HAND - COMPLETE 3+ VIEW
3 series · 3 of 3 positions shown · non-contrast
Comparison: None.

CLINICAL DATA: Left hand injury by WARKEN. Swelling and bruising
to the long finger. Remote wrist injury.

EXAM:
LEFT HAND - COMPLETE 3+ VIEW

[hand ap]
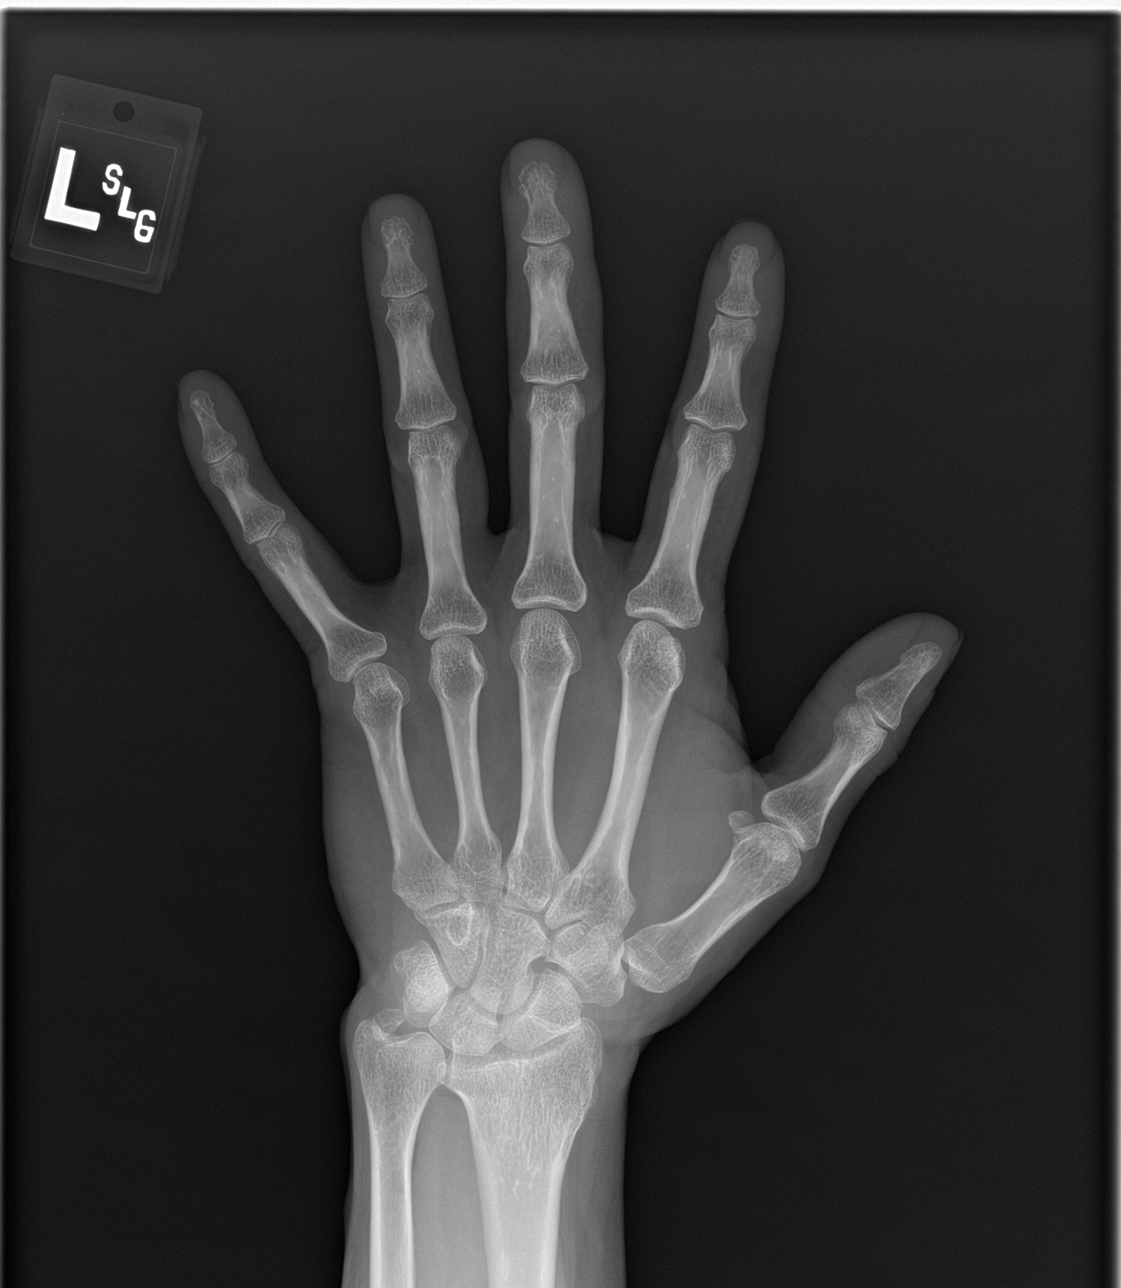

[hand obl]
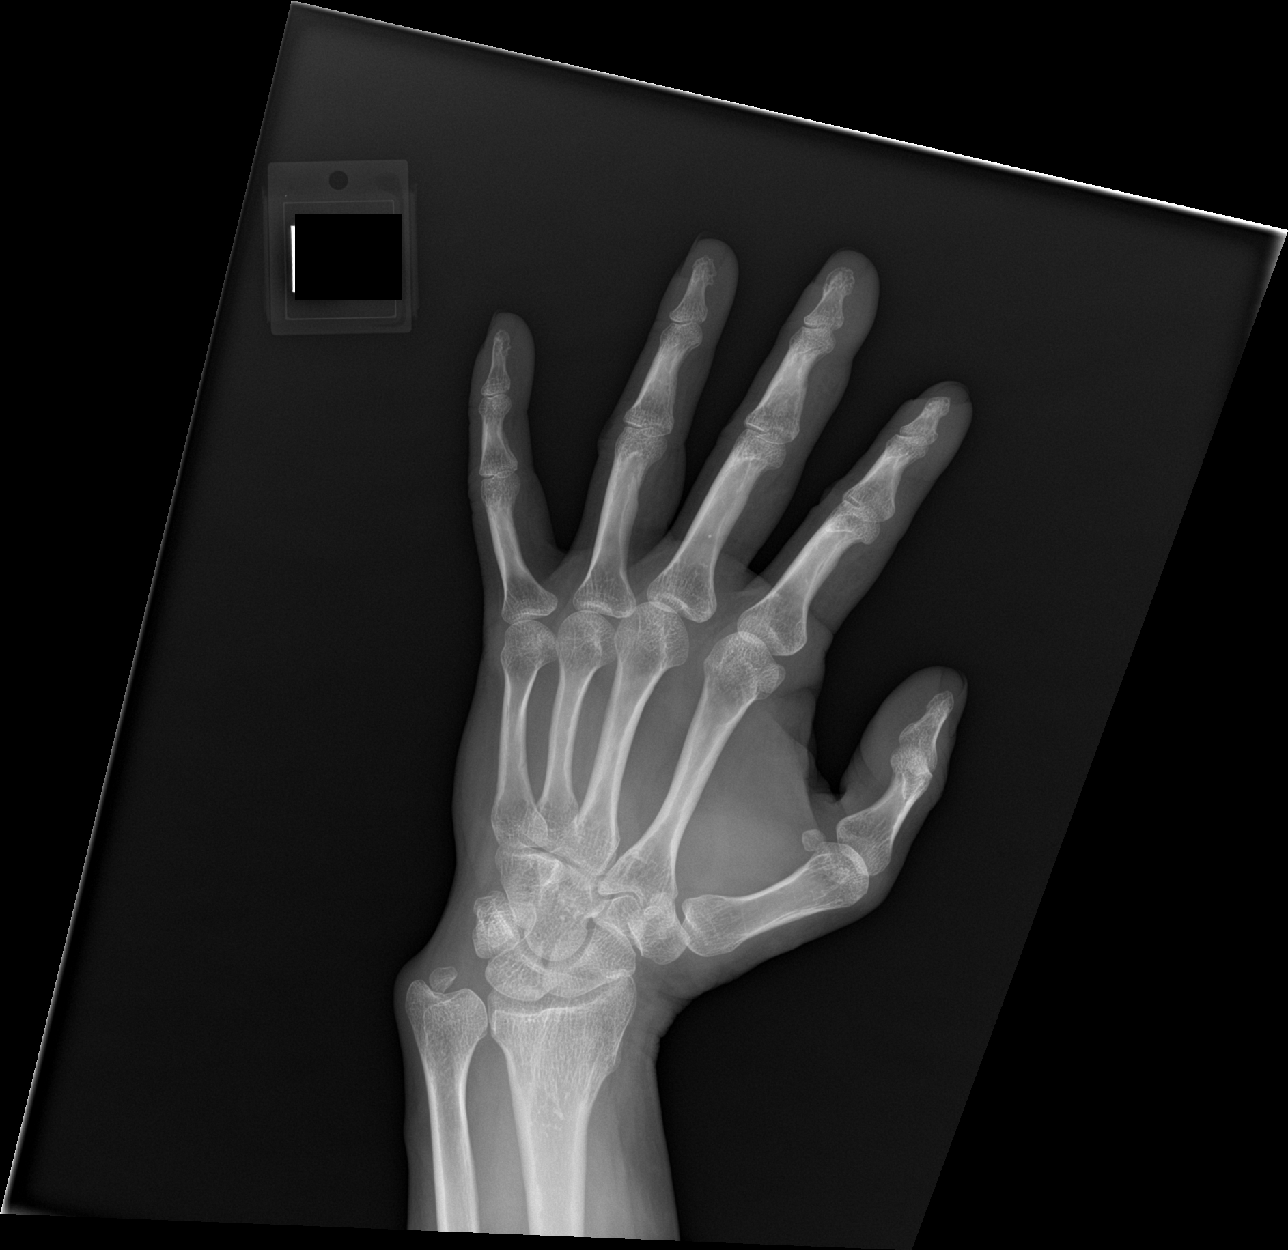

[hand lat]
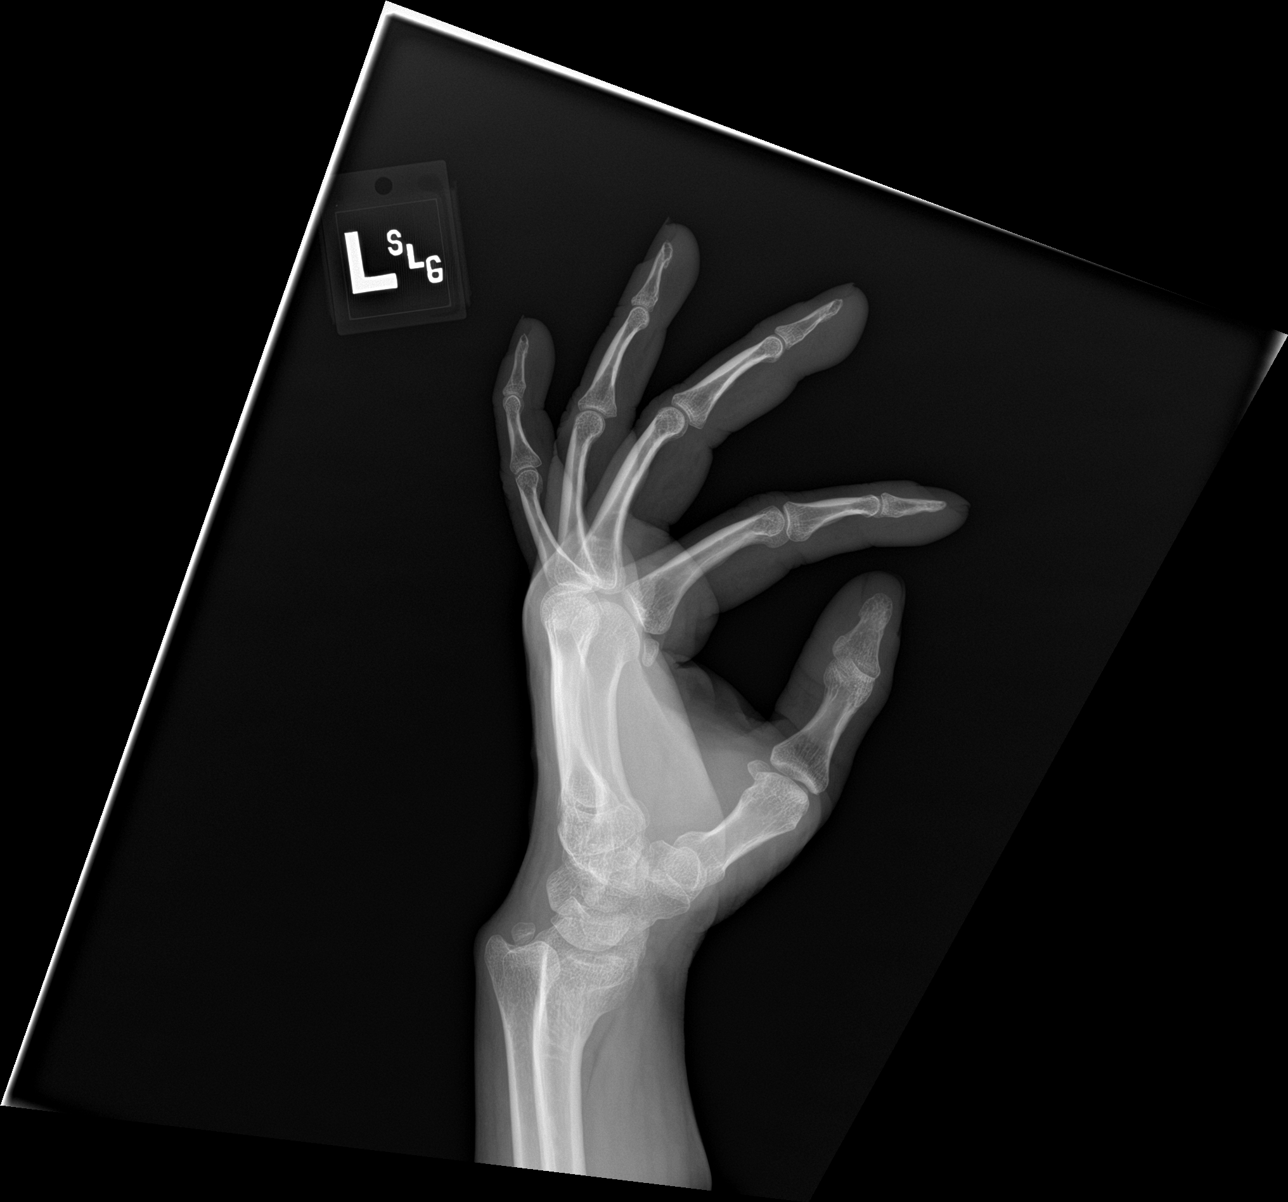

[3 of 3 positions shown; findings below may reference images not displayed]

FINDINGS: There is a fracture of the tuft of the distal phalanx of the long
finger with at most minimal displacement on the frontal projection.
There is no dislocation. There is a chronic deformity of the distal
radius as well as evidence of a remote ulnar styloid fracture. There
is positive ulnar variance, and the ulna may be dorsally subluxed or
dislocated.
IMPRESSION: 1. Acute distal tuft fracture of the long finger.
2. Remote distal radius and ulna injuries.

## 2018-07-23 MED ORDER — MELOXICAM 15 MG PO TABS: 15.0000 mg | ORAL_TABLET | Freq: Every day | ORAL | 0 refills | Status: DC

## 2018-07-23 MED ORDER — HYDROCODONE-ACETAMINOPHEN 5-325 MG PO TABS: 1.0000 | ORAL_TABLET | ORAL | 0 refills | Status: DC | PRN

## 2018-07-23 NOTE — ED Triage Notes (Signed)
Pt states she smashed her left middle finger with a hammer this morning . Pt has noted swelling and brusing

## 2018-07-23 NOTE — ED Provider Notes (Signed)
[facility] Emergency Department Provider Note  ____________________________________________  Time seen: Approximately 7:58 PM  I have reviewed the triage vital signs and the nursing notes.   HISTORY  Chief Complaint Finger Injury    HPI Sophia [name] is a 38 y.o. female who presents the emergency department complaining of pain, ecchymosis to the distal aspect of the middle finger left hand.  Patient reports that she was using a hammer at her job when she accidentally hit the end of her finger.  Patient reports pain, mild swelling, bruising to the distal aspect of the middle finger.  No other injury or complaint.  No medications prior to arrival         History reviewed. No pertinent past medical history.  There are no active problems to display for this patient.   History reviewed. No pertinent surgical history.  Prior to Admission medications   Medication Sig Start Date End Date Taking? Authorizing Provider  HYDROcodone-acetaminophen (NORCO/VICODIN) 5-325 MG tablet Take 1 tablet by mouth every 4 (four) hours as needed for moderate pain. 07/23/18   Timiko Offutt, Charline Bills, PA-C  meloxicam (MOBIC) 15 MG tablet Take 1 tablet (15 mg total) by mouth daily. 07/23/18   Assunta Pupo, Charline Bills, PA-C    Allergies Patient has no known allergies.  No family history on file.  Social History Social History   Tobacco Use  . Smoking status: Never Smoker  . Smokeless tobacco: Never Used  Substance Use Topics  . Alcohol use: Not Currently  . Drug use: Not Currently     Review of Systems  Constitutional: No fever/chills Eyes: No visual changes.  Cardiovascular: no chest pain. Respiratory: no cough. No SOB. Gastrointestinal: No abdominal pain.  No nausea, no vomiting.   Musculoskeletal: Positive for injury, pain, ecchymosis to the distal aspect of the third digit left hand Skin: Negative for rash, abrasions, lacerations, ecchymosis. Neurological:  Negative for headaches, focal weakness or numbness. 10-point ROS otherwise negative.  ____________________________________________   PHYSICAL EXAM:  VITAL SIGNS: ED Triage Vitals  Enc Vitals Group     BP 07/23/18 1842 121/73     Pulse Rate 07/23/18 1842 78     Resp 07/23/18 1842 16     Temp 07/23/18 1842 97.9 F (36.6 C)     Temp Source 07/23/18 1842 Oral     SpO2 07/23/18 1842 98 %     Weight 07/23/18 1844 120 lb (54.4 kg)     Height 07/23/18 1844 5\' 2"  (1.575 m)     Head Circumference --      Peak Flow --      Pain Score 07/23/18 1843 6     Pain Loc --      Pain Edu? --      Excl. in [place]? --      Constitutional: Alert and oriented. Well appearing and in no acute distress. Eyes: Conjunctivae are normal. PERRL. EOMI. Head: Atraumatic. Neck: No stridor.    Cardiovascular: Normal rate, regular rhythm. Normal S1 and S2.  Good peripheral circulation. Respiratory: Normal respiratory effort without tachypnea or retractions. Lungs CTAB. Good air entry to the bases with no decreased or absent breath sounds. Musculoskeletal: Full range of motion to all extremities. No gross deformities appreciated.  Examination of the left hand reveals ecchymosis to the finger pad of the third digit.  No open lacerations or abrasions.  Patient has good range of motion to the digit.  Very tender to palpation over the distal phalanx.  No  palpable abnormality.  Sensation and cap refill intact.  No subungual hematoma identified. Neurologic:  Normal speech and language. No gross focal neurologic deficits are appreciated.  Skin:  Skin is warm, dry and intact. No rash noted. Psychiatric: Mood and affect are normal. Speech and behavior are normal. Patient exhibits appropriate insight and judgement.   ____________________________________________   LABS (all labs ordered are listed, but only abnormal results are displayed)  Labs Reviewed - No data to  display ____________________________________________  EKG   ____________________________________________  RADIOLOGY I personally viewed and evaluated these images as part of my medical decision making, as well as reviewing the written report by the radiologist.  Dg Hand Complete Left  Result Date: 07/23/2018 CLINICAL DATA:  Left hand injury by a hammer. Swelling and bruising to the long finger. Remote wrist injury. EXAM: LEFT HAND - COMPLETE 3+ VIEW COMPARISON:  None. FINDINGS: There is a fracture of the tuft of the distal phalanx of the long finger with at most minimal displacement on the frontal projection. There is no dislocation. There is a chronic deformity of the distal radius as well as evidence of a remote ulnar styloid fracture. There is positive ulnar variance, and the ulna may be dorsally subluxed or dislocated. IMPRESSION: 1. Acute distal tuft fracture of the long finger. 2. Remote distal radius and ulna injuries. Electronically Signed   By: Logan Bores M.D.   On: 07/23/2018 19:37    ____________________________________________    PROCEDURES  Procedure(s) performed:    Procedures    Medications - No data to display   ____________________________________________   INITIAL IMPRESSION / ASSESSMENT AND PLAN / ED COURSE  Pertinent labs & imaging results that were available during my care of the patient were reviewed by me and considered in my medical decision making (see chart for details).  Review of the [place] CSRS was performed in accordance of the [place] prior to dispensing any controlled drugs.           Patient's diagnosis is consistent with distal phalanx fracture of the third digit left hand.  Patient presented to the emergency department after accidentally striking the middle finger with a hammer.  X-ray reveals distal tuft fracture.  Full range of motion intact.  Fingers are buddy taped together for stabilization.  Patient will prescribe limited amount of pain  medication as well as meloxicam.  Follow-up with orthopedics if needed..  Patient is given ED precautions to return to the ED for any worsening or new symptoms.     ____________________________________________  FINAL CLINICAL IMPRESSION(S) / ED DIAGNOSES  Final diagnoses:  Closed nondisplaced fracture of distal phalanx of left middle finger, initial encounter      NEW MEDICATIONS STARTED DURING THIS VISIT:  ED Discharge Orders         Ordered    HYDROcodone-acetaminophen (NORCO/VICODIN) 5-325 MG tablet  Every 4 hours PRN     07/23/18 2002    meloxicam (MOBIC) 15 MG tablet  Daily     07/23/18 2002              This chart was dictated using voice recognition software/Dragon. Despite best efforts to proofread, errors can occur which can change the meaning. Any change was purely unintentional.    Brynda Peon 07/23/18 Andreas Blower, MD 07/23/18 2213

## 2018-07-23 NOTE — ED Notes (Signed)
See triage note    Stats she hit her left middle finger with a hammer this am  Bruising and swelling noted

## 2019-04-09 ENCOUNTER — Ambulatory Visit
Admission: EM | Admit: 2019-04-09 | Discharge: 2019-04-09 | Disposition: A | Discharge status: Home / Self Care | Payer: Self-pay | Attending: Family Medicine | Admitting: Family Medicine

## 2019-04-09 ENCOUNTER — Other Ambulatory Visit: Payer: Self-pay

## 2019-04-09 DIAGNOSIS — R110 Nausea: Secondary | ICD-10-CM

## 2019-04-09 DIAGNOSIS — R6883 Chills (without fever): Secondary | ICD-10-CM

## 2019-04-09 DIAGNOSIS — R42 Dizziness and giddiness: Secondary | ICD-10-CM

## 2019-04-09 MED ORDER — MECLIZINE HCL 25 MG PO TABS: 25.0000 mg | ORAL_TABLET | Freq: Three times a day (TID) | ORAL | 0 refills | Status: DC | PRN

## 2019-04-09 NOTE — Discharge Instructions (Signed)
Medication as prescribed.  No need to worry at this time.  Take care  Dr. Lacinda Axon

## 2019-04-09 NOTE — ED Triage Notes (Signed)
Pt presents with c/o nighttime chills while sleeping, some dizziness when waking up in the mornings, some nausea and some neck pain. She denies any fever, cough or other symptoms. She also reports anytime she lays down and tried to get up she feels very dizzy. She denies any LOC, injury or any known medical conditions. She has not checked her blood pressure but does wonder if that may be causing her symptoms.

## 2019-04-09 NOTE — ED Provider Notes (Signed)
[facility]    CSN: [id number] Arrival date & time: 04/09/19  [id number]      History   Chief Complaint Chief Complaint  Patient presents with  . Dizziness    HPI  39 year old female presents with dizziness.  Patient reports that she has had ongoing dizziness for the past few days.  She reports that it is worse when she wakes up in the morning.  Associated nausea.  Worse with abrupt movements and worse when getting up from a lying down position.  She has had some times where she felt like the room was spinning.  Also reports that she has been experiencing chills particularly at night.  No documented fever.  No respiratory symptoms.  No known relieving factors.  No other complaints.  Home Medications    Prior to Admission medications   Medication Sig Start Date End Date Taking? Authorizing Provider  meclizine (ANTIVERT) 25 MG tablet Take 1 tablet (25 mg total) by mouth 3 (three) times daily as needed for dizziness. 04/09/19   Coral Spikes, DO   Social History Social History   Tobacco Use  . Smoking status: Never Smoker  . Smokeless tobacco: Never Used  Substance Use Topics  . Alcohol use: Not Currently  . Drug use: Not Currently    Allergies   Patient has no known allergies.   Review of Systems Review of Systems  Constitutional: Positive for chills. Negative for fever.  HENT: Negative.   Respiratory: Negative.   Gastrointestinal: Positive for nausea.  Neurological: Positive for dizziness.   Physical Exam Triage Vital Signs ED Triage Vitals  Enc Vitals Group     BP 04/09/19 0952 118/80     Pulse Rate 04/09/19 0952 (!) 58     Resp --      Temp 04/09/19 0952 98.2 F (36.8 C)     Temp Source 04/09/19 0952 Oral     SpO2 04/09/19 0952 100 %     Weight 04/09/19 0949 140 lb (63.5 kg)     Height 04/09/19 0949 4' 11.5" (1.511 m)     Head Circumference --      Peak Flow --      Pain Score 04/09/19 0949 7     Pain Loc --      Pain Edu? --      Excl. in  [place]? --    Updated Vital Signs BP 118/80 (BP Location: Left Arm)   Pulse (!) 58   Temp 98.2 F (36.8 C) (Oral)   Ht 4' 11.5" (1.511 m)   Wt 63.5 kg   LMP 03/12/2019   SpO2 100%   BMI 27.80 kg/m   Visual Acuity Right Eye Distance:   Left Eye Distance:   Bilateral Distance:    Right Eye Near:   Left Eye Near:    Bilateral Near:     Physical Exam Vitals and nursing note reviewed.  Constitutional:      General: She is not in acute distress.    Appearance: Normal appearance. She is not ill-appearing.  HENT:     Head: Normocephalic and atraumatic.  Eyes:     General:        Right eye: No discharge.        Left eye: No discharge.     Conjunctiva/sclera: Conjunctivae normal.     Pupils: Pupils are equal, round, and reactive to light.  Cardiovascular:     Rate and Rhythm: Regular rhythm. Bradycardia present.  Heart sounds: No murmur.  Pulmonary:     Effort: Pulmonary effort is normal.     Breath sounds: Normal breath sounds. No wheezing, rhonchi or rales.  Neurological:     Mental Status: She is alert.  Psychiatric:        Mood and Affect: Mood normal.        Behavior: Behavior normal.    UC Treatments / Results  Labs (all labs ordered are listed, but only abnormal results are displayed) Labs Reviewed - No data to display  EKG   Radiology No results found.  Procedures Procedures (including critical care time)  Medications Ordered in UC Medications - No data to display  Initial Impression / Assessment and Plan / UC Course  I have reviewed the triage vital signs and the nursing notes.  Pertinent labs & imaging results that were available during my care of the patient were reviewed by me and considered in my medical decision making (see chart for details).    39 year old female presents with dizziness.  Suspect benign paroxysmal positional vertigo.  Meclizine as directed.  Supportive care.  Final Clinical Impressions(s) / UC Diagnoses   Final  diagnoses:  Dizziness     Discharge Instructions     Medication as prescribed.  No need to worry at this time.  Take care  Dr. Lacinda Axon    ED Prescriptions    Medication Sig Dispense Auth. Provider   meclizine (ANTIVERT) 25 MG tablet Take 1 tablet (25 mg total) by mouth 3 (three) times daily as needed for dizziness. 30 tablet Coral Spikes, DO     PDMP not reviewed this encounter.   Coral Spikes, [place] 04/09/19 1035

## 2019-07-22 ENCOUNTER — Other Ambulatory Visit: Payer: Self-pay

## 2019-07-22 ENCOUNTER — Ambulatory Visit: Payer: Self-pay | Attending: Oncology | Admitting: *Deleted

## 2019-07-22 ENCOUNTER — Encounter: Payer: Self-pay | Admitting: Emergency Medicine

## 2019-07-22 ENCOUNTER — Emergency Department
Admission: EM | Admit: 2019-07-22 | Discharge: 2019-07-22 | Disposition: A | Discharge status: Home / Self Care | Payer: Self-pay | Attending: Emergency Medicine | Admitting: Emergency Medicine

## 2019-07-22 VITALS — BP 118/94 | HR 83 | Temp 97.0°F | Ht 61.0 in | Wt 138.0 lb

## 2019-07-22 DIAGNOSIS — M545 Low back pain, unspecified: Secondary | ICD-10-CM

## 2019-07-22 DIAGNOSIS — N63 Unspecified lump in unspecified breast: Secondary | ICD-10-CM

## 2019-07-22 DIAGNOSIS — Z79899 Other long term (current) drug therapy: Secondary | ICD-10-CM | POA: Insufficient documentation

## 2019-07-22 HISTORY — DX: Dizziness and giddiness: R42

## 2019-07-22 MED ORDER — KETOROLAC TROMETHAMINE 30 MG/ML IJ SOLN
30.0000 mg | Freq: Once | INTRAMUSCULAR | Status: AC
  Administered 2019-07-22: 21:00:00 30 mg via INTRAMUSCULAR
  Filled 2019-07-22: qty 1

## 2019-07-22 MED ORDER — CYCLOBENZAPRINE HCL 10 MG PO TABS: 10.0000 mg | ORAL_TABLET | Freq: Three times a day (TID) | ORAL | 0 refills | Status: DC | PRN

## 2019-07-22 MED ORDER — MELOXICAM 15 MG PO TABS: 15.0000 mg | ORAL_TABLET | Freq: Every day | ORAL | 2 refills | Status: DC

## 2019-07-22 MED ORDER — KETOROLAC TROMETHAMINE 30 MG/ML IJ SOLN
INTRAMUSCULAR | Status: AC
  Filled 2019-07-22: qty 1

## 2019-07-22 NOTE — Discharge Instructions (Signed)
Follow-up with your regular doctor.  Please call for an appointment.  Take medication as prescribed.  Apply ice to the lower back.

## 2019-07-22 NOTE — ED Triage Notes (Signed)
Pt to ED from home c/o lower back pain with radiating pain down right leg.  States pain is intermittent but worse with movement or bending.  States saw chiropractor Friday with xray done and told muscle spasms.

## 2019-07-22 NOTE — ED Notes (Addendum)
See triage note. Placed request for interpreter. Pt alert and sitting calmly in chair.

## 2019-07-22 NOTE — ED Provider Notes (Signed)
[facility] Emergency Department Provider Note  ____________________________________________   First MD Initiated Contact with Patient 07/22/19 2007     (approximate)  I have reviewed the triage vital signs and the nursing notes.   HISTORY  Chief Complaint Back Pain    HPI Sophia [name] is a 39 y.o. female presents emergency department complaining of low back pain with spasms.  States she has had back pain and was seen by chiropractor.  Chiropractor told her her spine was okay as he had x-rayed it.  She states she is having a lot of spasms.  She denies any numbness or tingling.  The pain does radiate to the upper thigh.  No known injury.  No dysuria or hematuria    Past Medical History:  Diagnosis Date  . Vertigo     There are no problems to display for this patient.   History reviewed. No pertinent surgical history.  Prior to Admission medications   Medication Sig Start Date End Date Taking? Authorizing Provider  cyclobenzaprine (FLEXERIL) 10 MG tablet Take 1 tablet (10 mg total) by mouth 3 (three) times daily as needed. 07/22/19   Fisher, Linden Dolin, PA-C  meclizine (ANTIVERT) 25 MG tablet Take 1 tablet (25 mg total) by mouth 3 (three) times daily as needed for dizziness. 04/09/19   Coral Spikes, DO  meloxicam (MOBIC) 15 MG tablet Take 1 tablet (15 mg total) by mouth daily. 07/22/19 07/21/20  Versie Starks, PA-C    Allergies Patient has no known allergies.  History reviewed. No pertinent family history.  Social History Social History   Tobacco Use  . Smoking status: Never Smoker  . Smokeless tobacco: Never Used  Substance Use Topics  . Alcohol use: Not Currently  . Drug use: Not Currently    Review of Systems  Constitutional: No fever/chills Eyes: No visual changes. ENT: No sore throat. Respiratory: Denies cough Cardiovascular: Denies chest pain Gastrointestinal: Denies abdominal pain Genitourinary: Negative for dysuria.  Musculoskeletal: Positive for back pain. Skin: Negative for rash. Psychiatric: no mood changes,     ____________________________________________   PHYSICAL EXAM:  VITAL SIGNS: ED Triage Vitals  Enc Vitals Group     BP 07/22/19 1950 (!) 125/49     Pulse Rate 07/22/19 1950 84     Resp 07/22/19 1950 16     Temp 07/22/19 1950 98.4 F (36.9 C)     Temp Source 07/22/19 1950 Oral     SpO2 07/22/19 1950 100 %     Weight 07/22/19 1951 140 lb (63.5 kg)     Height 07/22/19 1951 5\' 1"  (1.549 m)     Head Circumference --      Peak Flow --      Pain Score 07/22/19 1951 8     Pain Loc --      Pain Edu? --      Excl. in [place]? --     Constitutional: Alert and oriented. Well appearing and in no acute distress. Eyes: Conjunctivae are normal.  Head: Atraumatic. Nose: No congestion/rhinnorhea. Mouth/Throat: Mucous membranes are moist.   Neck:  supple no lymphadenopathy noted Cardiovascular: Normal rate, regular rhythm. Respiratory: Normal respiratory effort.  No retractions, GU: deferred Musculoskeletal: FROM all extremities, warm and well perfused decreased range of motion of the spine, patient has difficulty with forward flexion, no difficulty with hyperextension and twisting.  She is able to stand on her toes and walk on her heels.  Neurovascular appears to be intact Neurologic:  Normal speech and language.  Skin:  Skin is warm, dry and intact. No rash noted. Psychiatric: Mood and affect are normal. Speech and behavior are normal.  ____________________________________________   LABS (all labs ordered are listed, but only abnormal results are displayed)  Labs Reviewed - No data to display ____________________________________________   ____________________________________________  RADIOLOGY    ____________________________________________   PROCEDURES  Procedure(s) performed: Toradol 30 mg IM   Procedures    ____________________________________________   INITIAL  IMPRESSION / ASSESSMENT AND PLAN / ED COURSE  Pertinent labs & imaging results that were available during my care of the patient were reviewed by me and considered in my medical decision making (see chart for details).   The patient is a 39 year old female presents emergency department with low back pain.  She has been seen by chiropractor and had x-rays which were normal.  He told her she has muscle spasms.  Physical exam shows patient to appear well.  She has difficulty with rising from a sitting position.  Difficulty with forward flexion.  Remainder of exam is unremarkable  Did explain the findings to the patient.  She was given a prescription for meloxicam and Flexeril.  She was given Toradol 30 mg IM prior to discharge.  She is to follow-up with chiropractor or orthopedics.  Return emergency department worsening.  States she understands will comply.  She is discharged stable condition.    Sophia [name] was evaluated in Emergency Department on 07/22/2019 for the symptoms described in the history of present illness. She was evaluated in the context of the global COVID-19 pandemic, which necessitated consideration that the patient might be at risk for infection with the SARS-CoV-2 virus that causes COVID-19. Institutional protocols and algorithms that pertain to the evaluation of patients at risk for COVID-19 are in a state of rapid change based on information released by regulatory bodies including the CDC and federal and state organizations. These policies and algorithms were followed during the patient's care in the ED.   As part of my medical decision making, I reviewed the following data within the [facility] notes reviewed and incorporated, Old chart reviewed, Notes from prior ED visits and [place] Controlled Substance Database  ____________________________________________   FINAL CLINICAL IMPRESSION(S) / ED DIAGNOSES  Final diagnoses:  Acute midline low back  pain without sciatica      NEW MEDICATIONS STARTED DURING THIS VISIT:  New Prescriptions   CYCLOBENZAPRINE (FLEXERIL) 10 MG TABLET    Take 1 tablet (10 mg total) by mouth 3 (three) times daily as needed.   MELOXICAM (MOBIC) 15 MG TABLET    Take 1 tablet (15 mg total) by mouth daily.     Note:  This document was prepared using Dragon voice recognition software and may include unintentional dictation errors.    Versie Starks, PA-C 07/22/19 2050    Arta Silence, MD 07/22/19 [phone]

## 2019-07-22 NOTE — Progress Notes (Signed)
  Subjective:     Patient ID: Sophia [name], female   DOB: 20-Feb-1981, 39 y.o.   MRN: [id number]  HPI   Review of Systems     Objective:   Physical Exam Chest:          Assessment:     39 year old Hispanic female presents to [facility] with complaints of a left breast mass for approximately 3 weeks.  Loyda, the interpreter present during the interview and exam.  She also states having low back pain for about 6 weeks.  States she is going to the chiropractor for her back, and was told that it was "muscle spasms".  On clinical breast exam I can visualize an approximate dime sized red spot on the left breast at 12:00.  I can also visually see that there is fullness at the 12:00 position of the left breast.  On physical exam I can palpate an asymmetrical 3 cm thickening at 12:00 left breast.  Patient is in significant pain and had difficulty lying supine for her breast exam.  Since there is no imaging available for review of the patient's back, I am uncertain if there could be any correlation between her back pain and breast mass.  Taught self breast awareness.  Patient has been screened for eligibility.  She does not have any insurance, Medicare or Medicaid.  She also meets financial eligibility.   Risk Assessment    Risk Scores      07/22/2019   Last edited by: Theodore Demark, RN   5-year risk: 0.3 %   Lifetime risk: 6.4 %            Plan:      Since patient has had a previous mammogram at an outside facility, [facility] will not schedule the patient for her mammogram without those prior images.  Jeanella Anton called patient's pcp and they requested the priors be sent to [facility].  Patient is aware that she will be scheduled as soon as those images arrive.  She is to call me if she has not heard anything in the next week.  Number given to my office.  Will follow up per [facility] protocol.

## 2019-07-23 ENCOUNTER — Encounter: Payer: Self-pay | Admitting: *Deleted

## 2019-07-28 ENCOUNTER — Inpatient Hospital Stay
Admission: RE | Admit: 2019-07-28 | Discharge: 2019-07-28 | Disposition: A | Discharge status: Home / Self Care | Payer: Self-pay | Source: Ambulatory Visit | Attending: *Deleted | Admitting: *Deleted

## 2019-07-28 ENCOUNTER — Other Ambulatory Visit: Payer: Self-pay | Admitting: *Deleted

## 2019-07-28 DIAGNOSIS — Z1231 Encounter for screening mammogram for malignant neoplasm of breast: Secondary | ICD-10-CM

## 2019-07-30 ENCOUNTER — Other Ambulatory Visit: Payer: Self-pay | Admitting: *Deleted

## 2019-07-30 DIAGNOSIS — N63 Unspecified lump in unspecified breast: Secondary | ICD-10-CM

## 2019-08-07 ENCOUNTER — Ambulatory Visit
Admission: RE | Admit: 2019-08-07 | Discharge: 2019-08-07 | Disposition: A | Discharge status: Home / Self Care | Payer: Self-pay | Source: Ambulatory Visit | Attending: Oncology | Admitting: Oncology

## 2019-08-07 ENCOUNTER — Other Ambulatory Visit: Payer: Self-pay | Admitting: *Deleted

## 2019-08-07 DIAGNOSIS — R920 Mammographic microcalcification found on diagnostic imaging of breast: Secondary | ICD-10-CM

## 2019-08-07 DIAGNOSIS — N63 Unspecified lump in unspecified breast: Secondary | ICD-10-CM

## 2019-08-07 IMAGING — MG DIGITAL DIAGNOSTIC BILAT W/ TOMO W/ CAD
8 of 16 series · 8 of 40 positions shown · non-contrast
Comparison: Previous exam(s).

CLINICAL DATA: Patient presents for evaluation of palpable
abnormality within the left breast.

EXAM:
DIGITAL DIAGNOSTIC BILATERAL MAMMOGRAM WITH CAD AND TOMO
ULTRASOUND BILATERAL BREAST

[L ML]
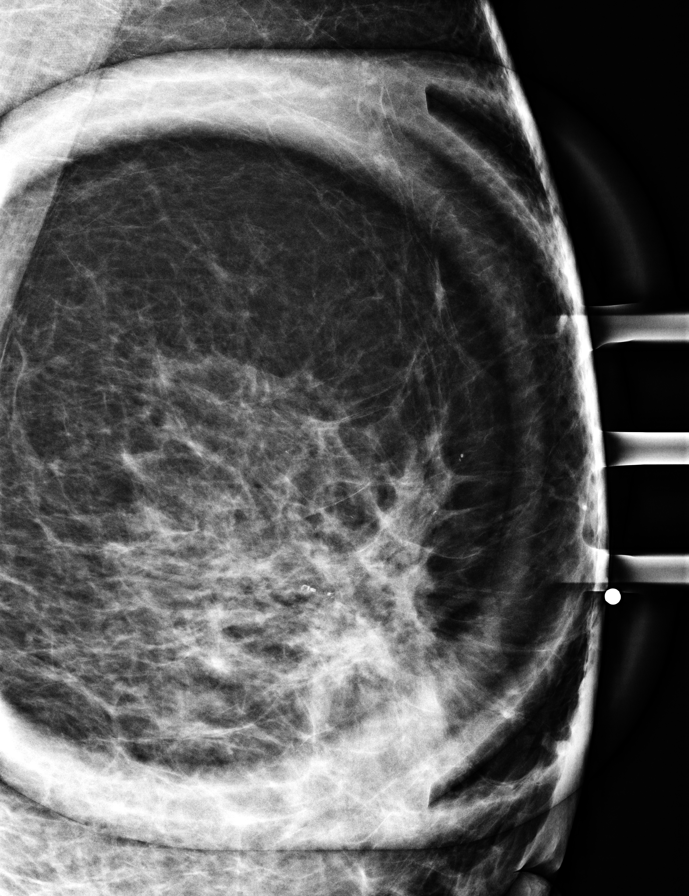

[L CC]
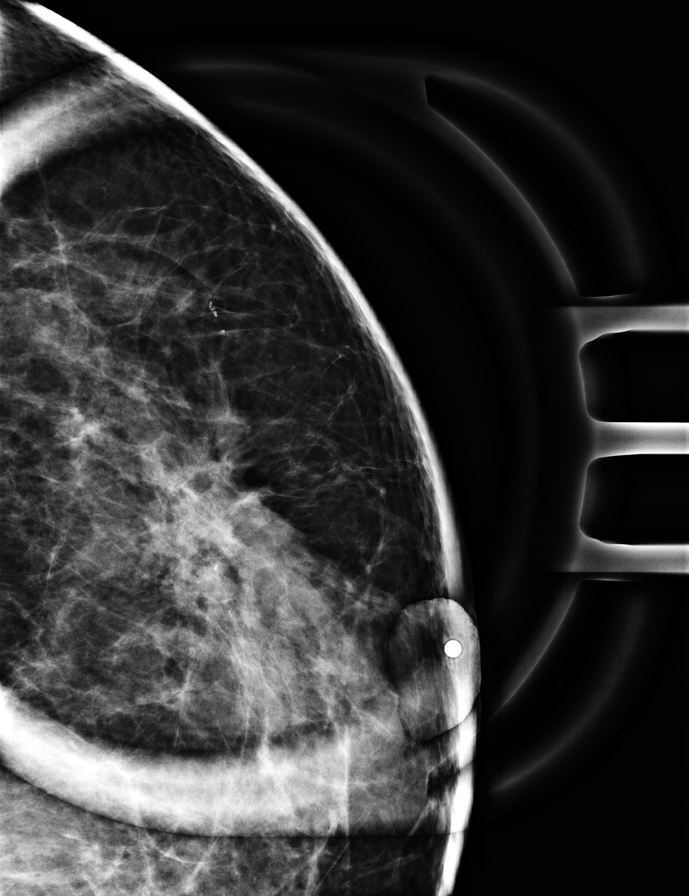

[L CC synth-2D (1 of 2)]
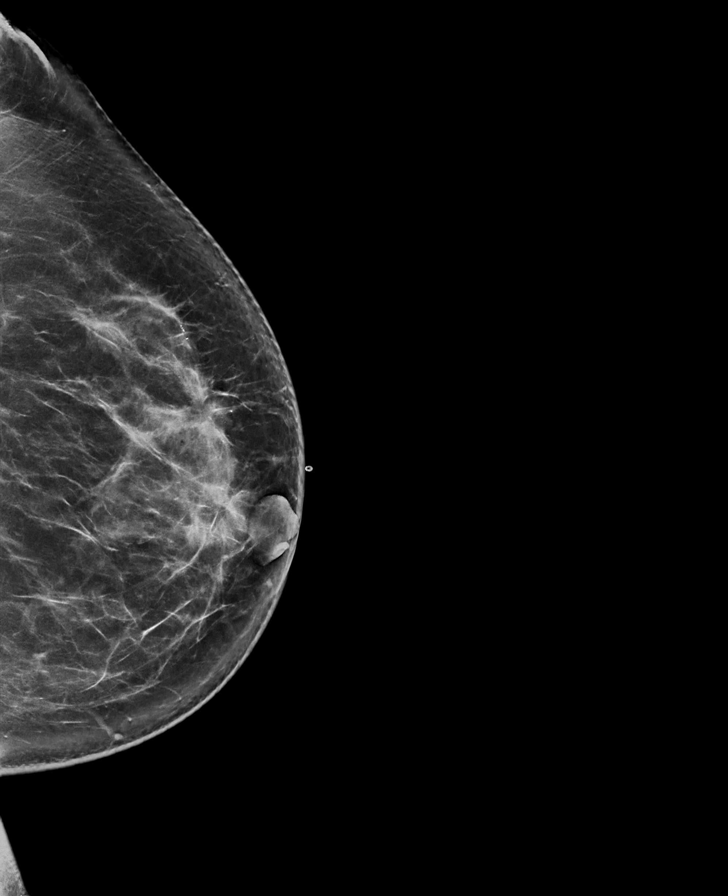

[R ML synth-2D]
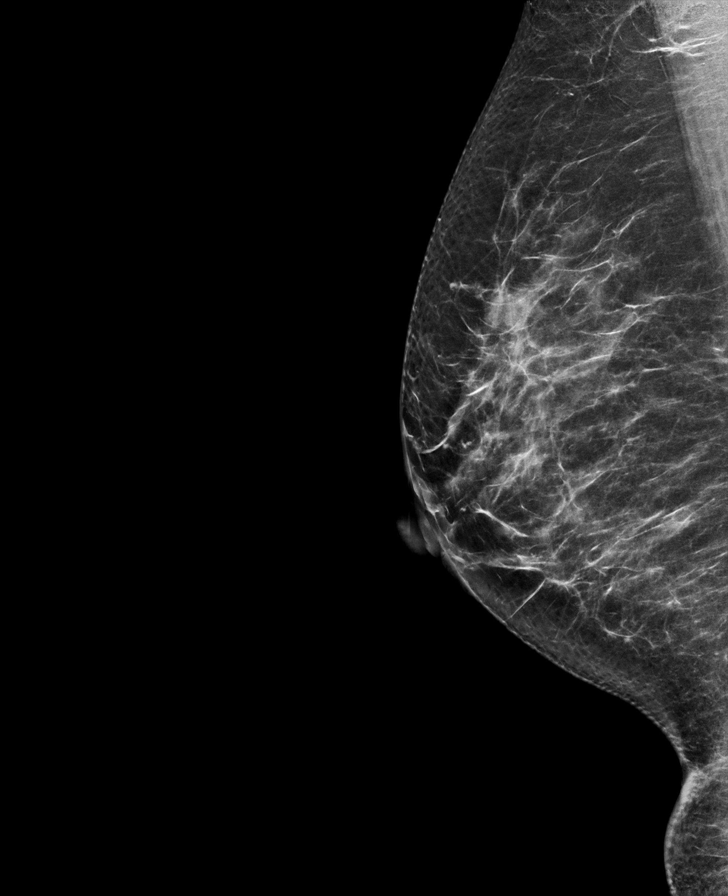

[L CC synth-2D (2 of 2)]
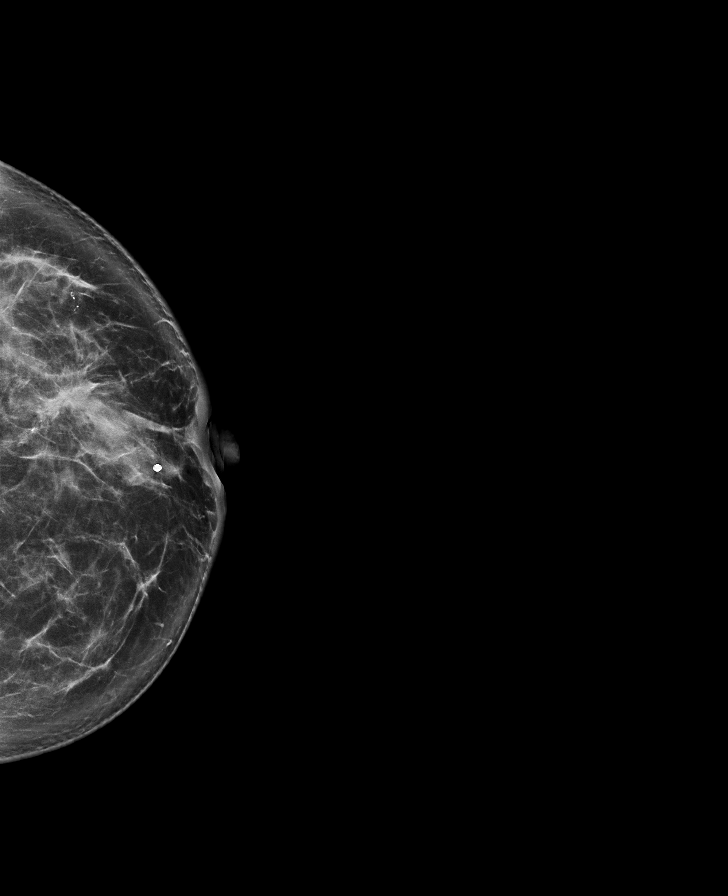

[L MLO synth-2D]
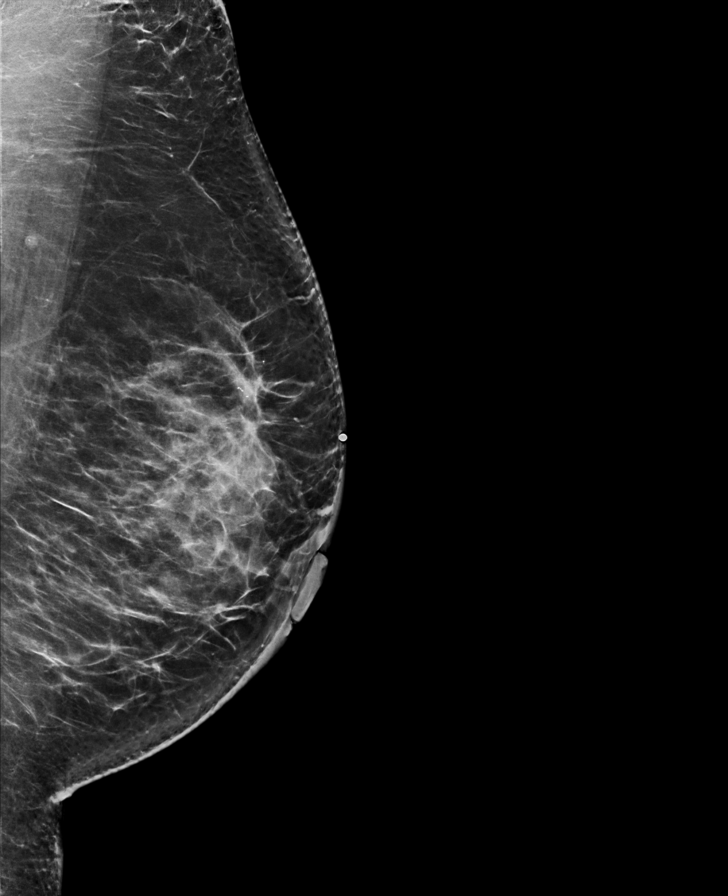

[L ML synth-2D]
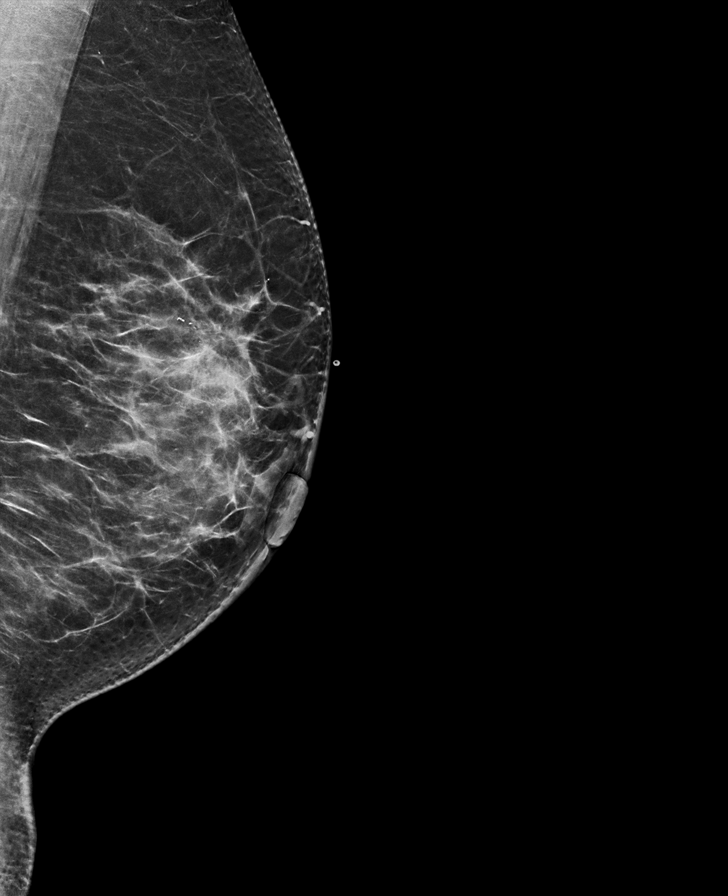

[R CC synth-2D]
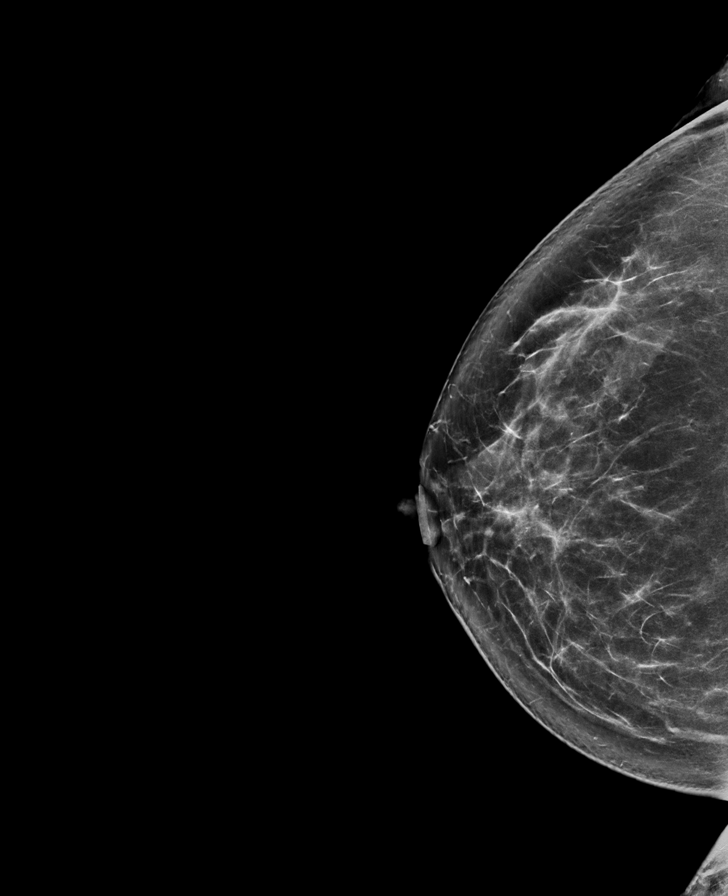

[8 of 40 positions shown; findings below may reference images not displayed]

ACR Breast Density Category c: The breast tissue is heterogeneously
dense, which may obscure small masses.
FINDINGS: Underlying the palpable marker within the upper outer left breast is
a focal area of masslike distortion. There are a few coarse
calcifications associated with this mass.

Within the lateral left breast anterior to middle depth there is a 5
mm group of coarse heterogeneous calcifications.

Questioned asymmetry within the superior right breast resolved with
additional imaging.

Mammographic images were processed with CAD.

On physical exam, there is a firm mass within the upper-outer left
breast.

Targeted ultrasound is performed, showing a 3.1 x 1.9 x 2.9 cm
lobular hypoechoic mass left breast 1 o'clock position 4 cm from
nipple corresponding with mass on mammogram.

At least 4 abnormal cortically thickened left axillary lymph nodes
are demonstrated measuring up to 5 mm.

Dense tissue is demonstrated within the superior right breast. No
suspicious mass is identified.
IMPRESSION: 1. Suspicious palpable left breast mass 1 o'clock position.
2. Multiple (at least 4) cortically thickened left axillary lymph
nodes.
3. Suspicious calcifications outer left breast middle depth.

RECOMMENDATION:
1. Ultrasound-guided core needle biopsy left breast mass 1 o'clock
position.
2. Ultrasound-guided core needle biopsy of 1 of the cortically
thickened left axillary lymph nodes.
3. Stereotactic guided core needle biopsy of the coarse
heterogeneous calcifications within the outer left breast.

I have discussed the findings and recommendations with the patient.
If applicable, a reminder letter will be sent to the patient
regarding the next appointment.

BI-RADS CATEGORY  5: Highly suggestive of malignancy.

## 2019-08-07 IMAGING — US US BREAST*L* LIMITED INC AXILLA
1 series · 13 of 14 positions shown · non-contrast
Comparison: Previous exam(s).

CLINICAL DATA: Patient presents for evaluation of palpable
abnormality within the left breast.

EXAM:
DIGITAL DIAGNOSTIC BILATERAL MAMMOGRAM WITH CAD AND TOMO
ULTRASOUND BILATERAL BREAST

[Series 1: us breast*left* limited inc axilla · 0.07mm/px · 13 of 14 slices shown]
[im 1/14]
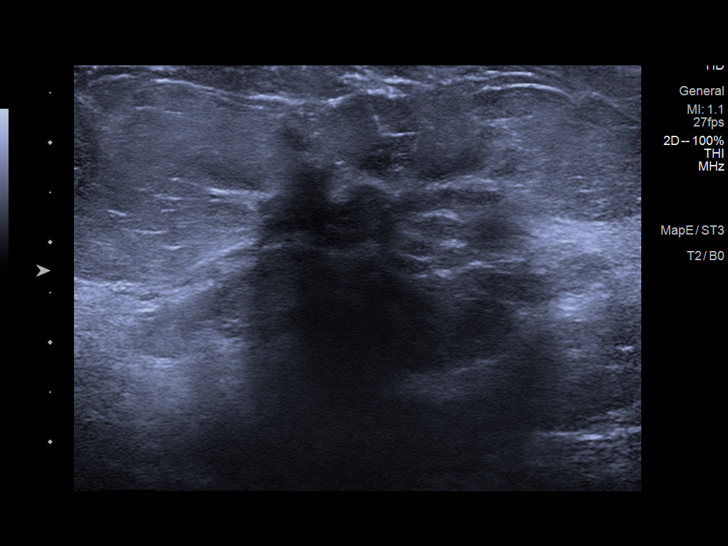
[im 2/14]
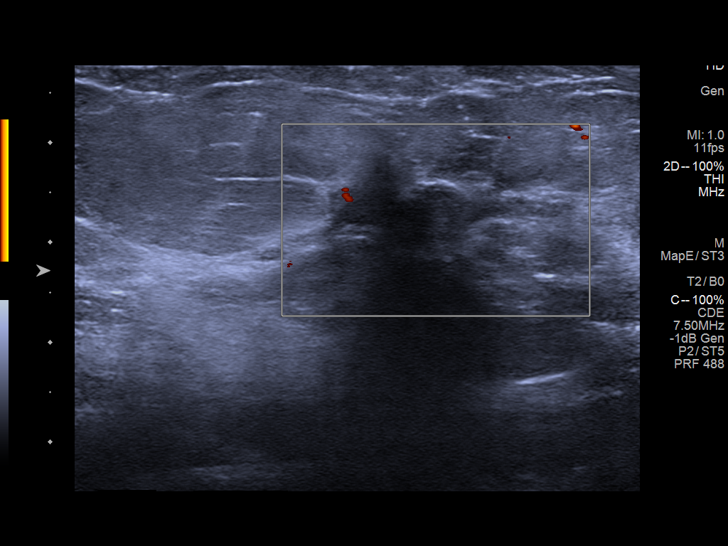
[im 3/14]
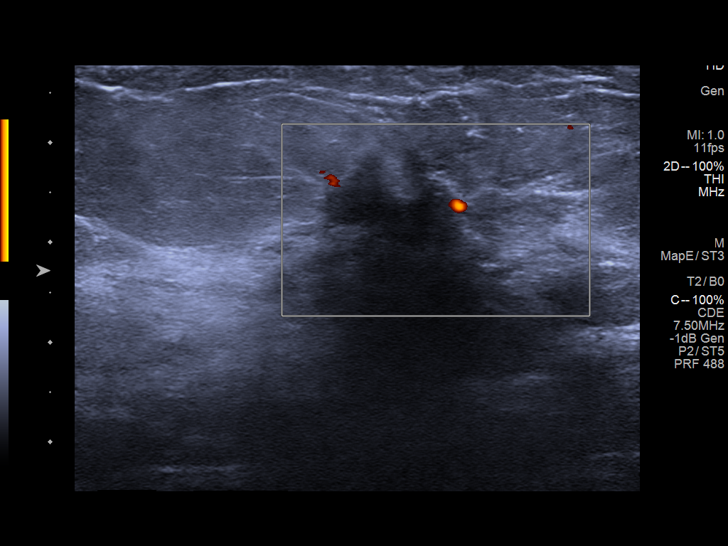
[im 4/14]
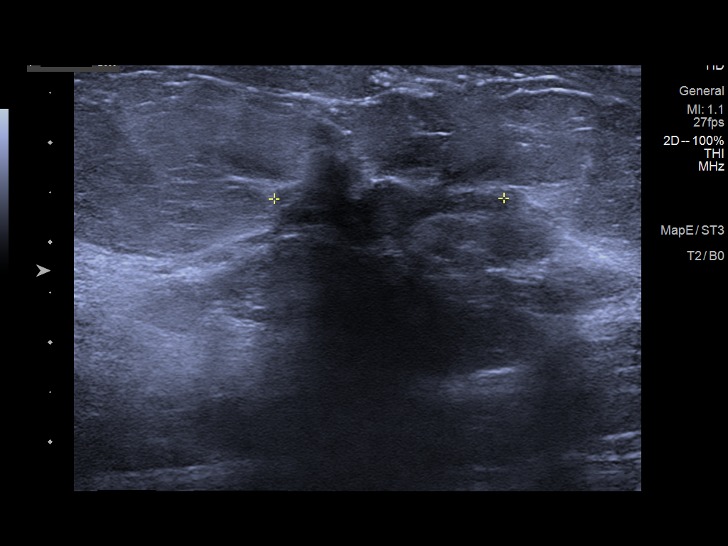
[im 5/14]
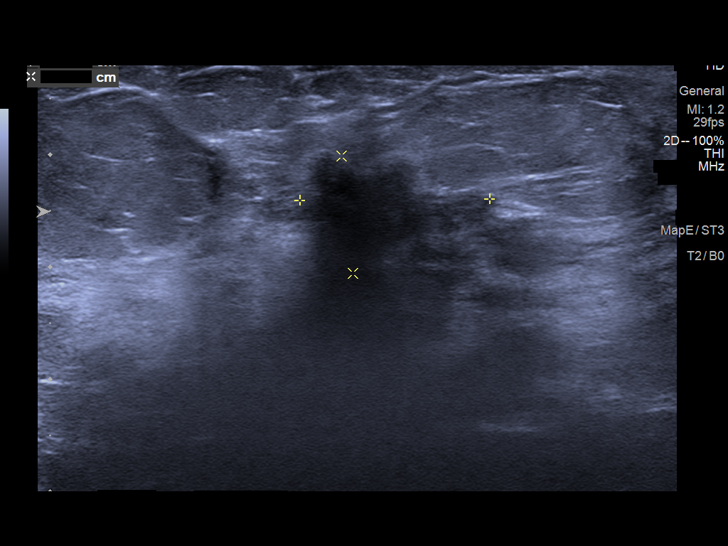
[im 6/14]
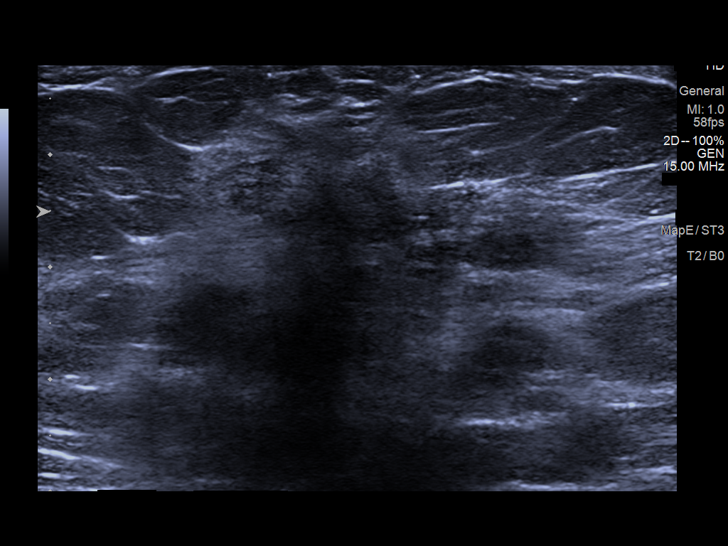
[im 8/14]
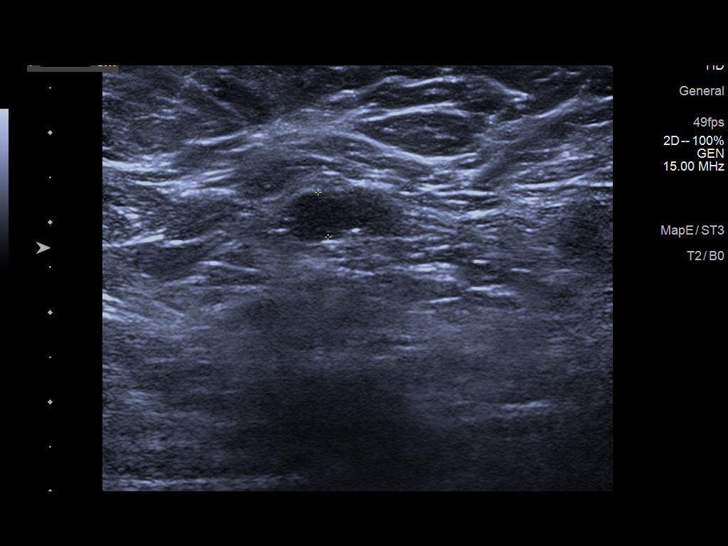
[im 9/14]
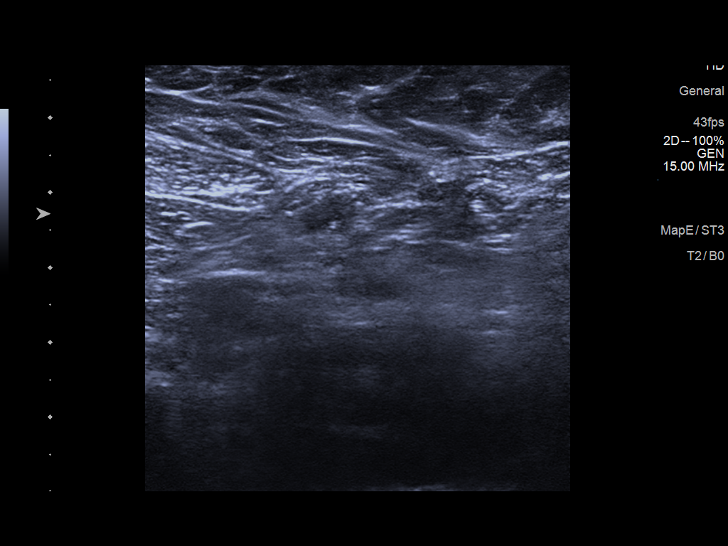
[im 10/14]
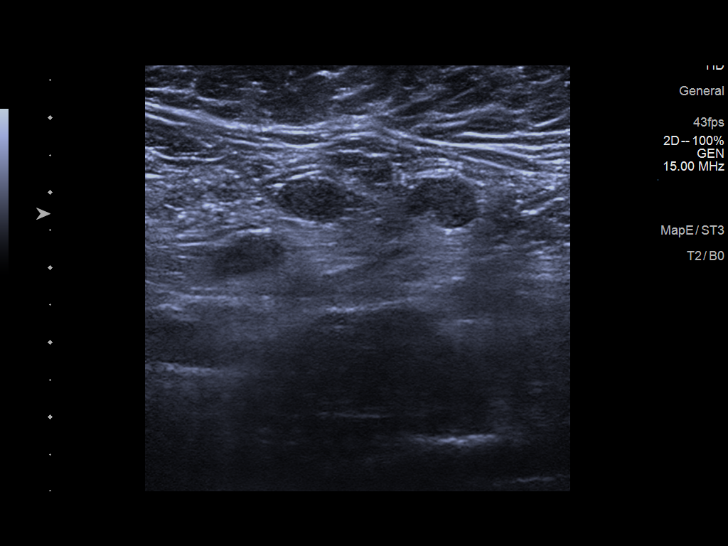
[im 11/14]
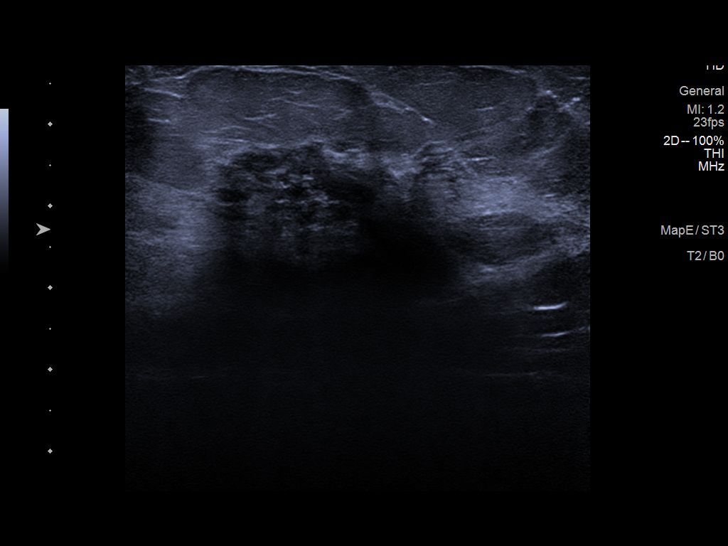
[im 12/14]
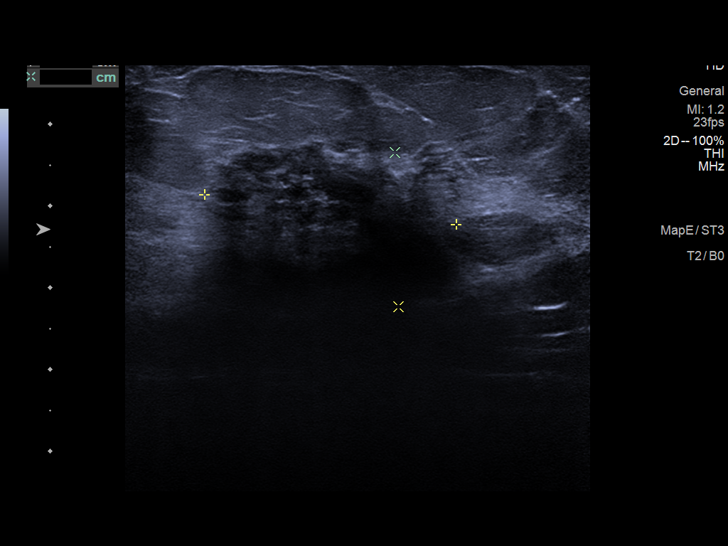
[im 13/14]
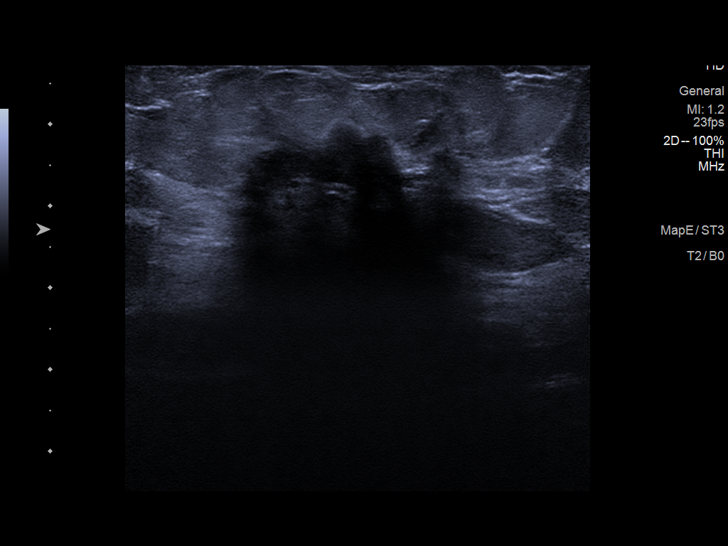
[im 14/14]
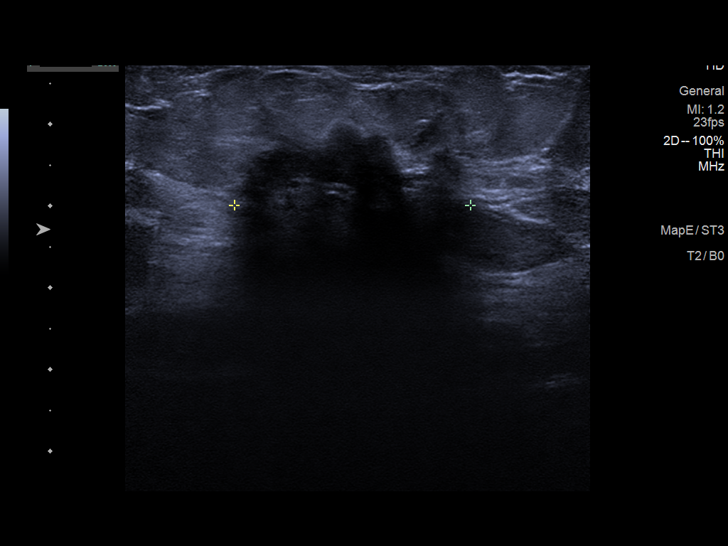

[13 of 14 positions shown; findings below may reference images not displayed]

ACR Breast Density Category c: The breast tissue is heterogeneously
dense, which may obscure small masses.
FINDINGS: Underlying the palpable marker within the upper outer left breast is
a focal area of masslike distortion. There are a few coarse
calcifications associated with this mass.

Within the lateral left breast anterior to middle depth there is a 5
mm group of coarse heterogeneous calcifications.

Questioned asymmetry within the superior right breast resolved with
additional imaging.

Mammographic images were processed with CAD.

On physical exam, there is a firm mass within the upper-outer left
breast.

Targeted ultrasound is performed, showing a 3.1 x 1.9 x 2.9 cm
lobular hypoechoic mass left breast 1 o'clock position 4 cm from
nipple corresponding with mass on mammogram.

At least 4 abnormal cortically thickened left axillary lymph nodes
are demonstrated measuring up to 5 mm.

Dense tissue is demonstrated within the superior right breast. No
suspicious mass is identified.
IMPRESSION: 1. Suspicious palpable left breast mass 1 o'clock position.
2. Multiple (at least 4) cortically thickened left axillary lymph
nodes.
3. Suspicious calcifications outer left breast middle depth.

RECOMMENDATION:
1. Ultrasound-guided core needle biopsy left breast mass 1 o'clock
position.
2. Ultrasound-guided core needle biopsy of 1 of the cortically
thickened left axillary lymph nodes.
3. Stereotactic guided core needle biopsy of the coarse
heterogeneous calcifications within the outer left breast.

I have discussed the findings and recommendations with the patient.
If applicable, a reminder letter will be sent to the patient
regarding the next appointment.

BI-RADS CATEGORY  5: Highly suggestive of malignancy.

## 2019-08-14 ENCOUNTER — Ambulatory Visit
Admission: RE | Admit: 2019-08-14 | Discharge: 2019-08-14 | Disposition: A | Discharge status: Home / Self Care | Payer: Self-pay | Source: Ambulatory Visit | Attending: Oncology | Admitting: Oncology

## 2019-08-14 DIAGNOSIS — N63 Unspecified lump in unspecified breast: Secondary | ICD-10-CM

## 2019-08-14 HISTORY — PX: BREAST BIOPSY: SHX20

## 2019-08-14 IMAGING — MG DIGITAL DIAGNOSTIC UNILAT LEFT W/ CAD
6 series · 7 of 18 positions shown · non-contrast
Comparison: Previous exam(s).
COMPARISON: Previous exam(s).

Addendum:
CLINICAL DATA: 38-year-old female presenting for biopsy of a left
breast mass, left breast calcifications and a left axillary lymph
node.

EXAM:
DIAGNOSTIC LEFT MAMMOGRAM POST ULTRASOUND AND STEREOTACTIC BIOPSIES

[L ML synth-2D]
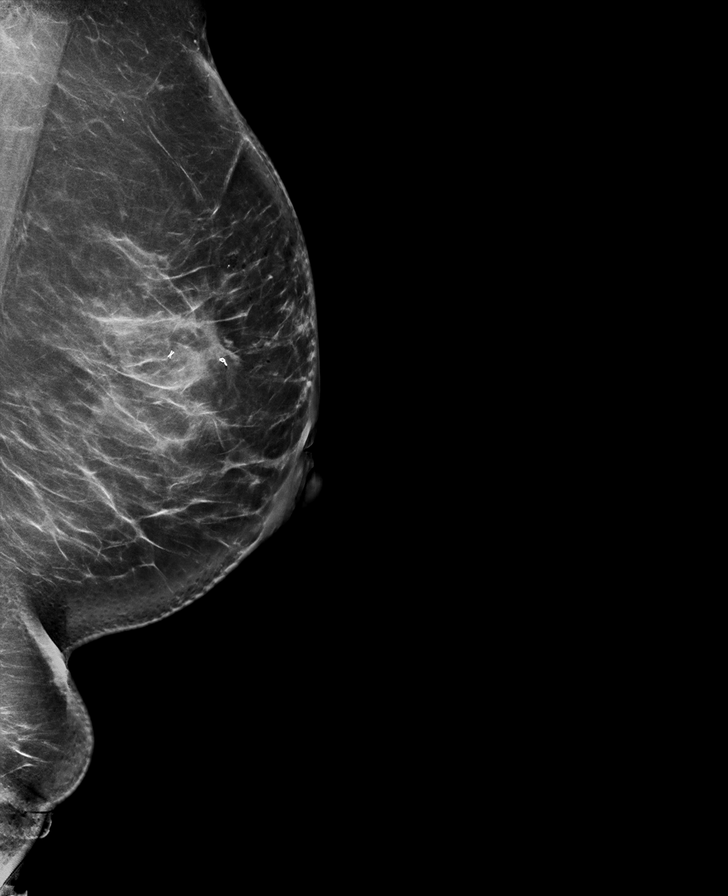

[L CC synth-2D]
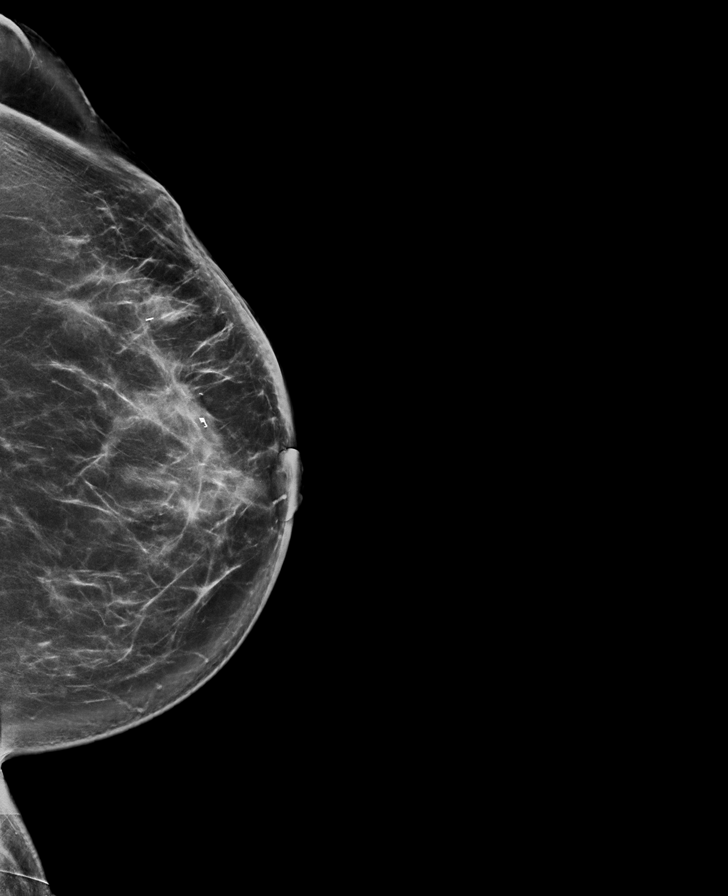

[L MLO synth-2D]
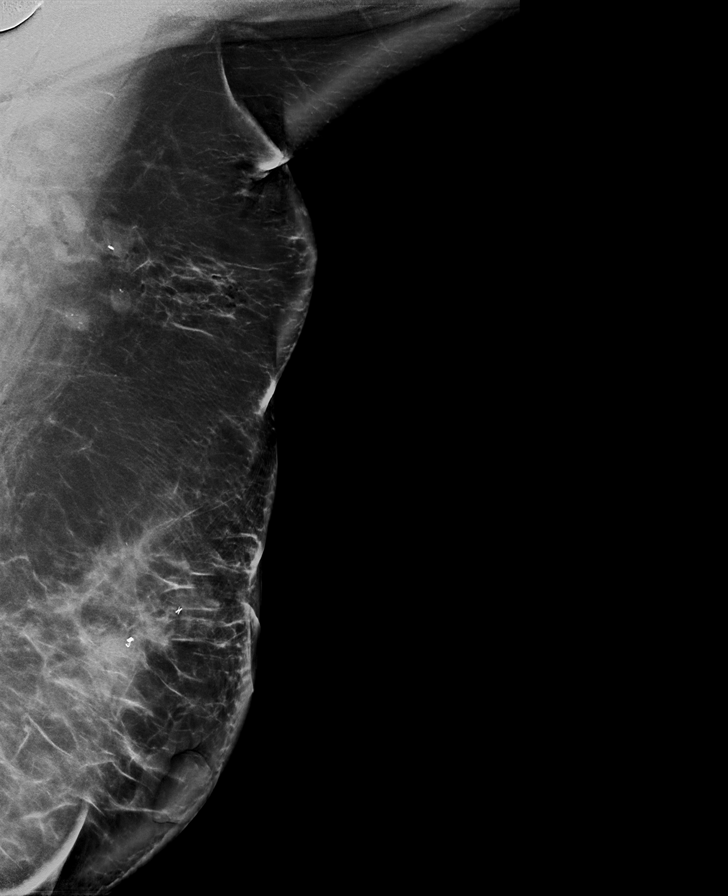

[L MLO tomo · 2 of 108 frames shown]
[frame 35/108]
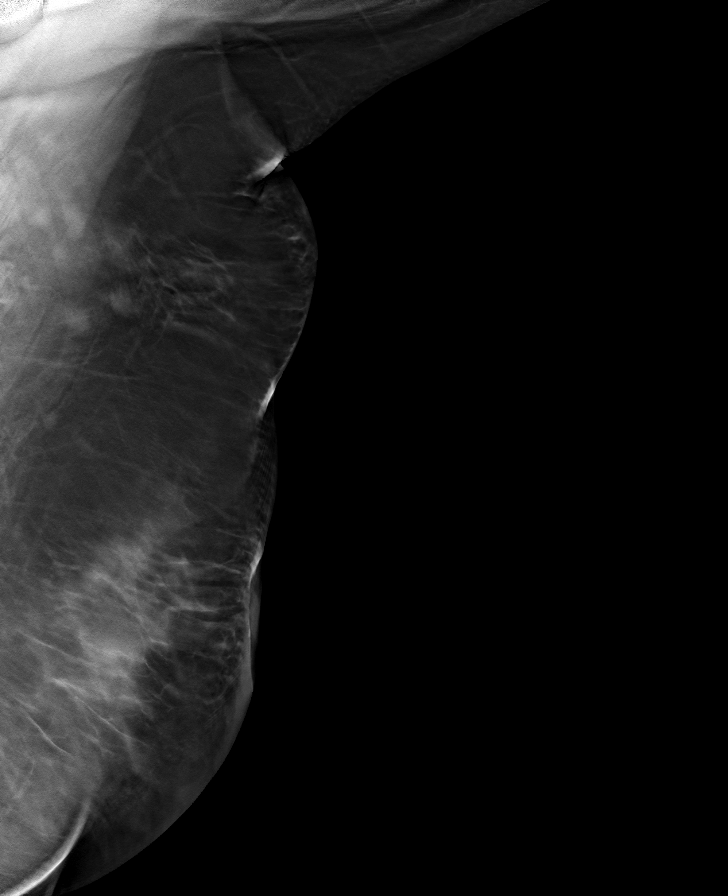
[frame 55/108]
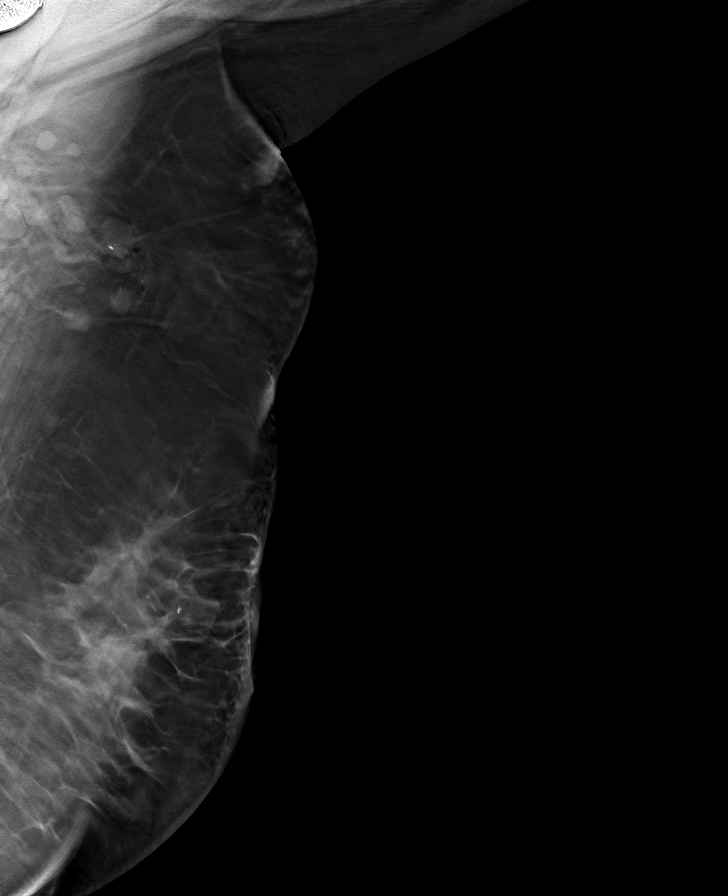

[L CC tomo · tomo slice 50/99.0]
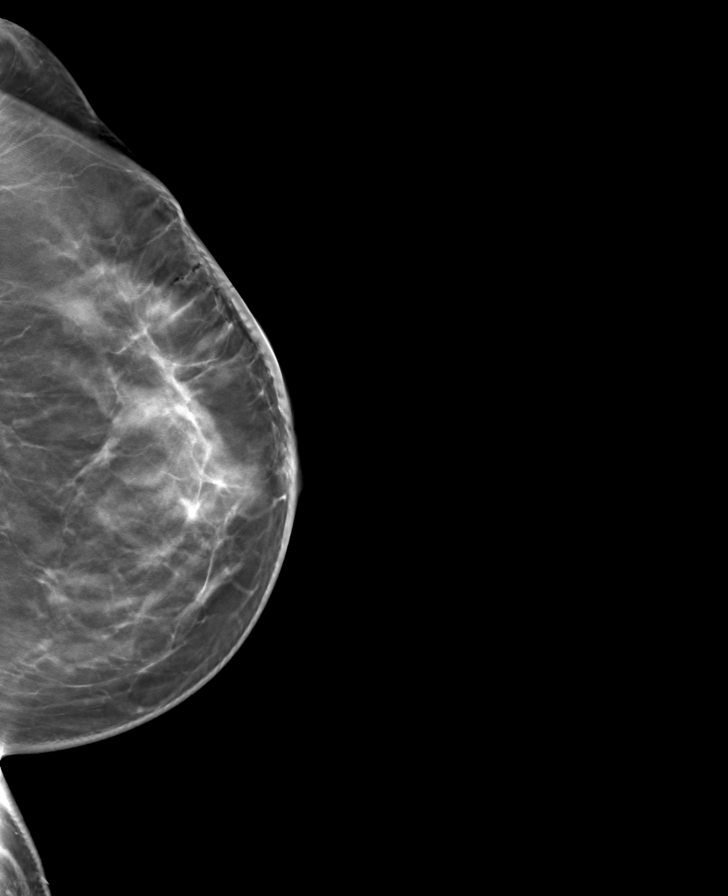

[L ML tomo · tomo slice 53/105.0]
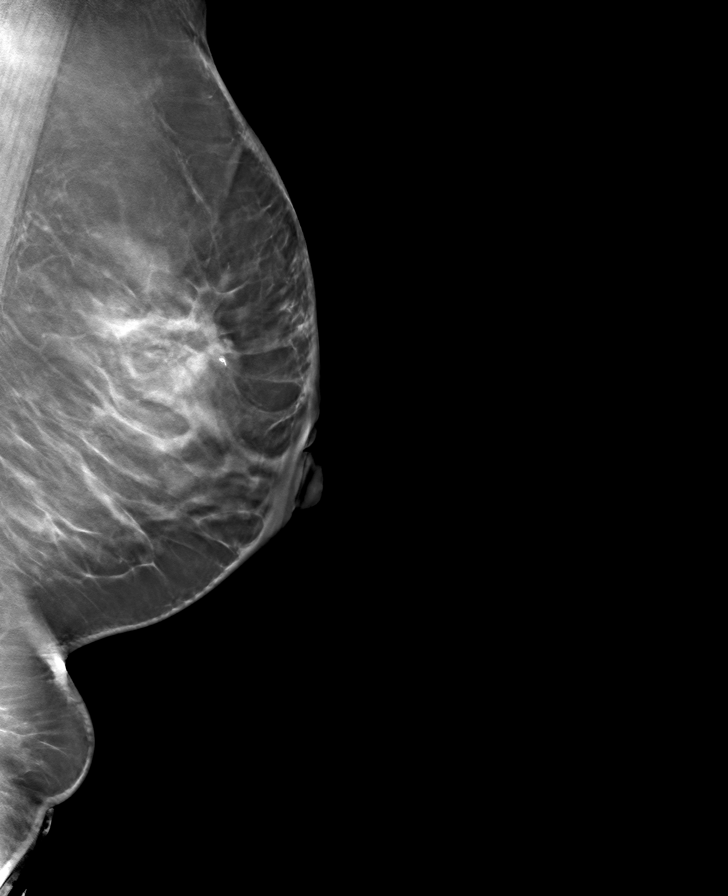

[7 of 18 positions shown; findings below may reference images not displayed]

FINDINGS: Mammographic images were obtained following ultrasound guided biopsy
of a left breast mass at 1 o'clock. The coil biopsy marking clip is
in expected position at the site of biopsy.

Mammographic images were obtained following ultrasound guided biopsy
of a left axillary lymph node. The HydroMARK biopsy marking clip is
in expected position at the site of biopsy.

Mammographic images were obtained following stereotactic guided
biopsy of calcifications. The X biopsy marking clip is in expected
position at the site of biopsy.
IMPRESSION: Appropriate positioning of the coil shaped biopsy marking clip at
the site of biopsy in the left breast 1 o'clock

Appropriate positioning of the X shaped biopsy marking clip at the
site of biopsy in the upper outer left breast.

Appropriate positioning of the HydroMARK shaped biopsy marking clip
at the site of biopsy in the left axilla.

Final Assessment: Post Procedure Mammograms for Marker Placement

ADDENDUM:
PATHOLOGY revealed:

A. BREAST, LEFT AT [DATE]; ULTRASOUND-GUIDED CORE NEEDLE BIOPSY: -
INVASIVE MAMMARY CARCINOMA, NO SPECIAL TYPE. At least 9 mm in this
sample. Grade 2. Ductal carcinoma in situ: Not identified.
Lymphovascular invasion: Not identified.

B. AXILLA, LEFT; ULTRASOUND-GUIDED CORE NEEDLE BIOPSY: - INVASIVE
MAMMARY CARCINOMA, NO SPECIAL TYPE.

Comment: Invasive carcinoma within this specimen measures
approximately 1 mm in

greatest extent, and is histologically similar to tumor identified
in part A. No definitive lymph node tissue is identified.

C. BREAST, LEFT UPPER OUTER QUADRANT; STEREOTACTIC CORE NEEDLE
BIOPSY: - INVASIVE MAMMARY CARCINOMA, NO SPECIAL TYPE. - DUCTAL
CARCINOMA IN SITU, HIGH GRADE WITH COMEDONECROSIS, WITH ASSOCIATED
CALCIFICATIONS.

Comment: The specimen consists predominantly of ductal carcinoma in
situ, present in 7 of 8 tissue blocks, and measuring up to 2 mm in
greatest linear extent. DCIS is associated with calcifications.
Invasive carcinoma is identified within a single block (C7),
measuring 1 mm in greatest extent, and is histologically similar to
tumor seen in parts A and B. Hormone receptor testing will be
limited to specimen A.

Pathology results are CONCORDANT with imaging findings, per Dr.
CALEV.

Breast MRI is recommended for extent of disease.

Multiple attempts to contact patient, using [employer],
were unsuccessful. Notified CALEV RN at [HOSPITAL]
[HOSPITAL] (who is patient's [REDACTED] coordinator) and she will
contact patient with biopsy results and arrange surgical referral.

Addendum by CALEV RN on [DATE].

*** End of Addendum ***
FINDINGS: Mammographic images were obtained following ultrasound guided biopsy
of a left breast mass at 1 o'clock. The coil biopsy marking clip is
in expected position at the site of biopsy.

Mammographic images were obtained following ultrasound guided biopsy
of a left axillary lymph node. The HydroMARK biopsy marking clip is
in expected position at the site of biopsy.

Mammographic images were obtained following stereotactic guided
biopsy of calcifications. The X biopsy marking clip is in expected
position at the site of biopsy.
IMPRESSION: Appropriate positioning of the coil shaped biopsy marking clip at
the site of biopsy in the left breast 1 o'clock

Appropriate positioning of the X shaped biopsy marking clip at the
site of biopsy in the upper outer left breast.

Appropriate positioning of the HydroMARK shaped biopsy marking clip
at the site of biopsy in the left axilla.

Final Assessment: Post Procedure Mammograms for Marker Placement

## 2019-08-14 IMAGING — MG US BREAST BX W LOC DEV 1ST LESION IMG BX SPEC US GUIDE*L*
1 series · 8 of 8 positions shown · non-contrast
Comparison: Previous exam(s).
COMPARISON: Previous exam(s).

Addendum:
CLINICAL DATA: 38-year-old female presenting for biopsy of a left
breast mass, left axillary lymph node and left breast
calcifications.

EXAM:
ULTRASOUND GUIDED LEFT BREAST CORE NEEDLE BIOPSY
US AXILLARY NODE CORE BIOPSY LEFT

[Series 1: MG view · 0.07mm/px · 8 of 15 slices shown]
[im 1/15]
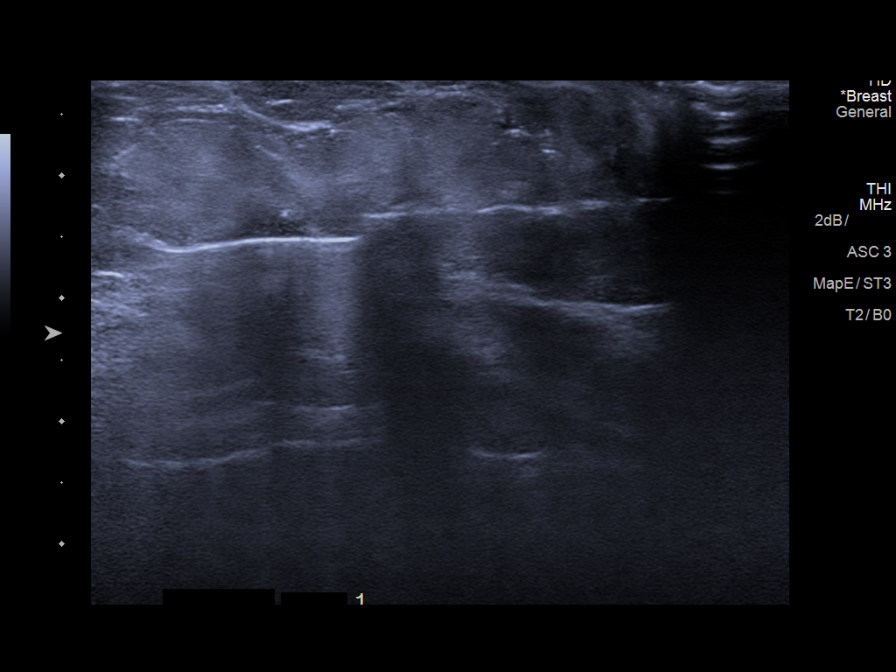
[im 3/15]
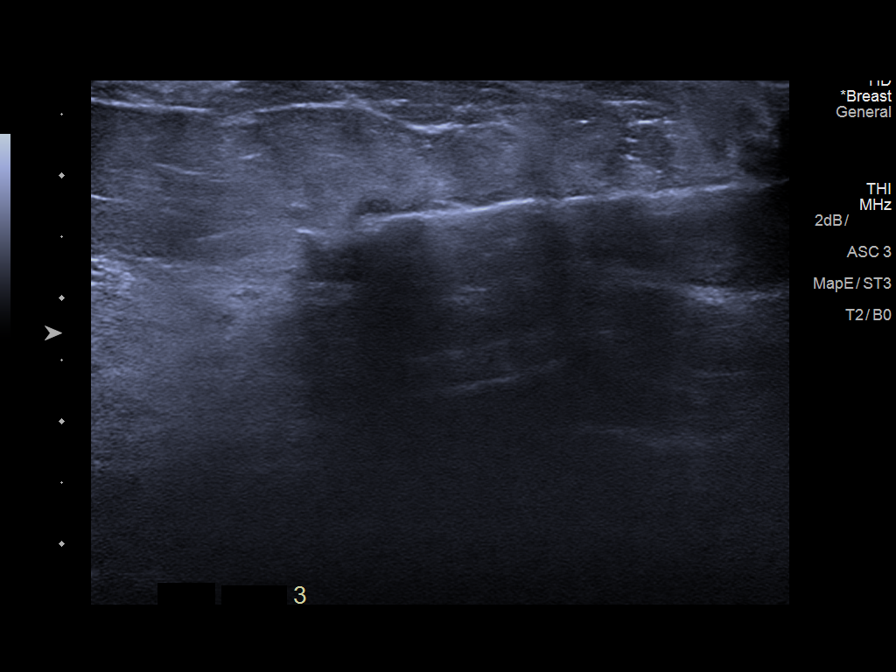
[im 5/15]
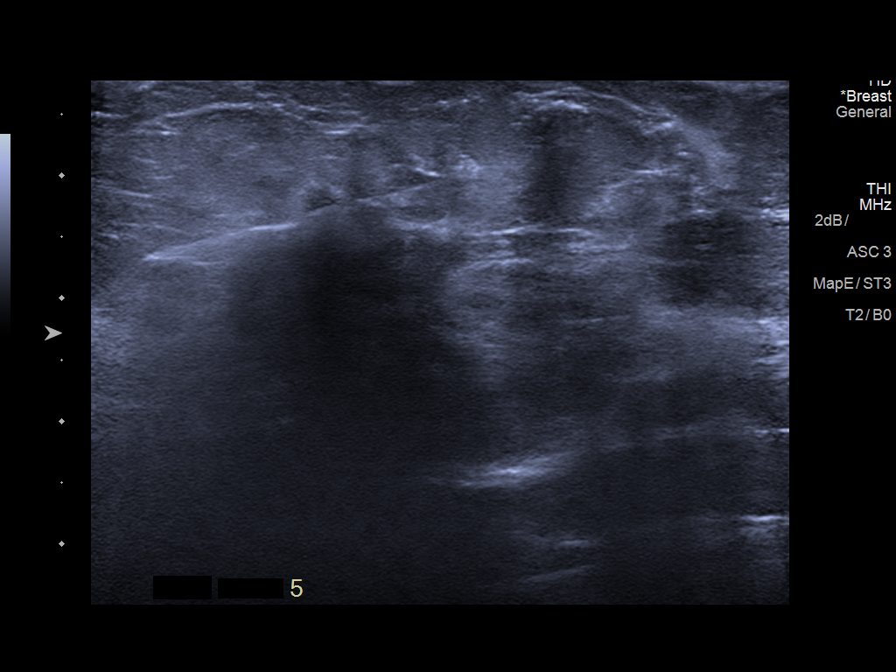
[im 7/15]
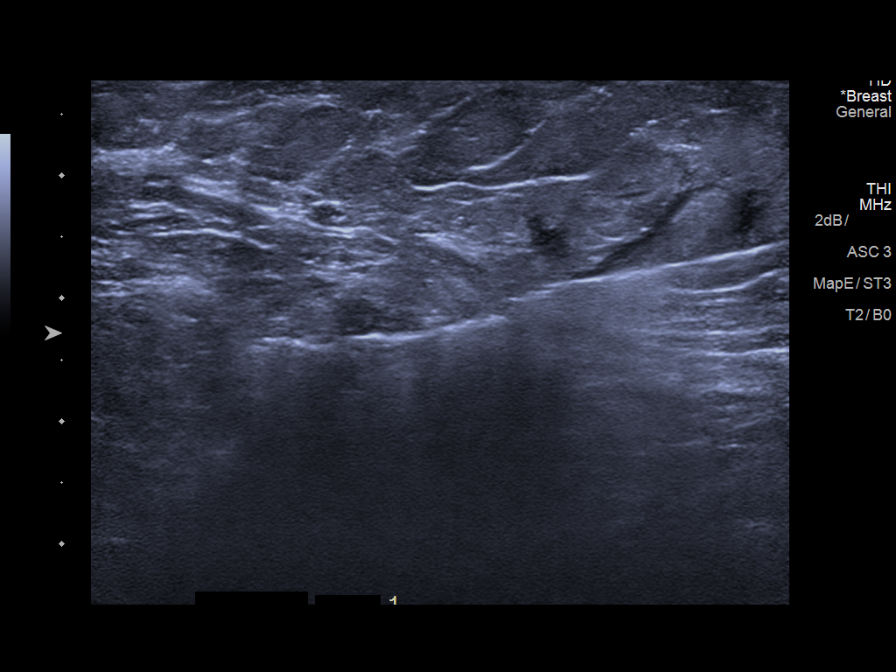
[im 9/15]
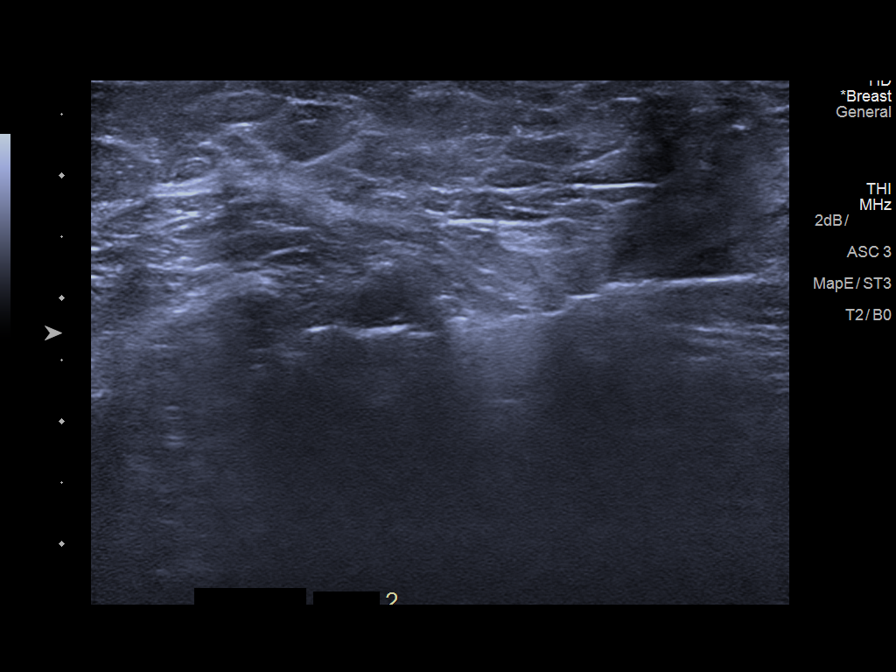
[im 11/15]
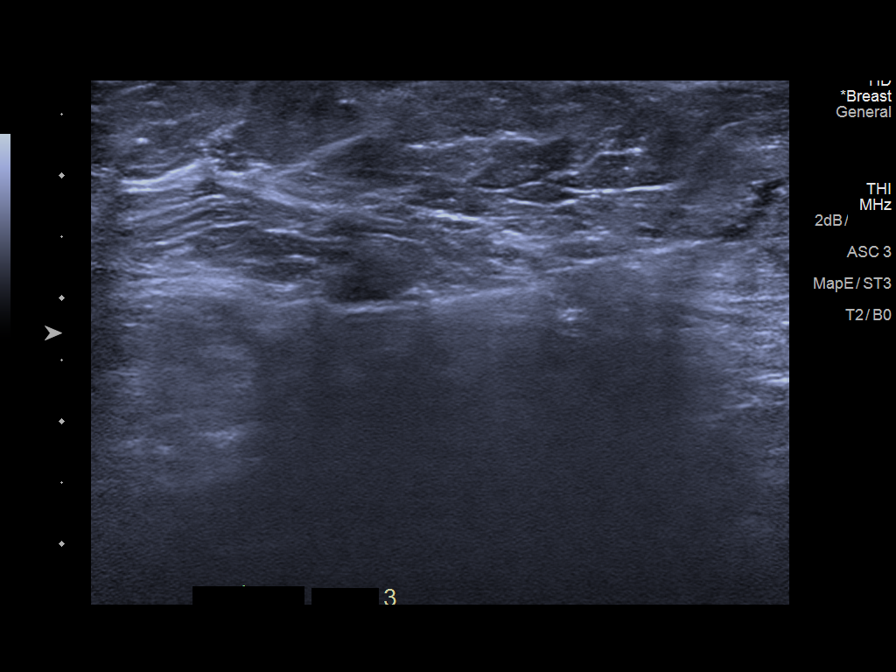
[im 13/15]
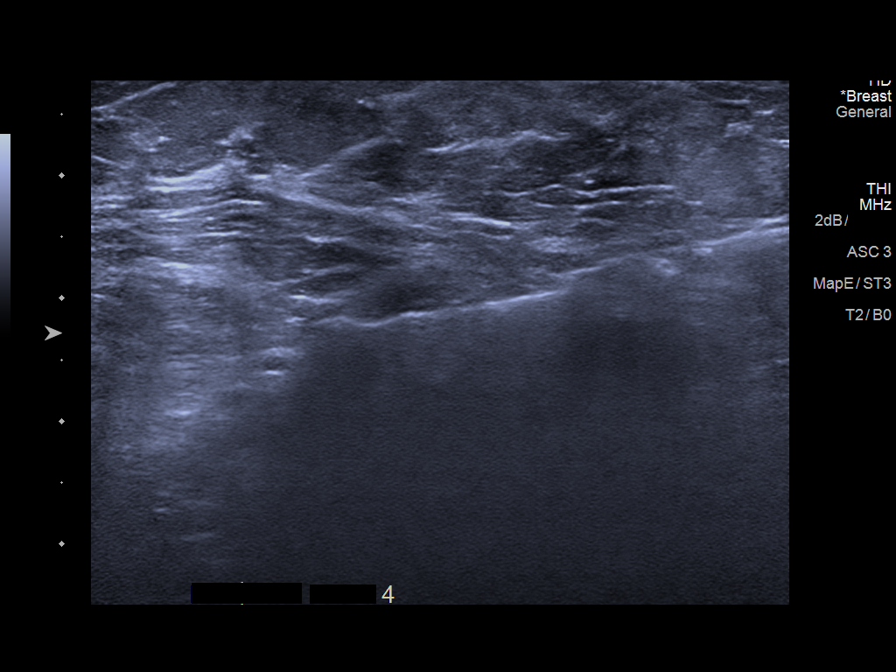
[im 15/15]
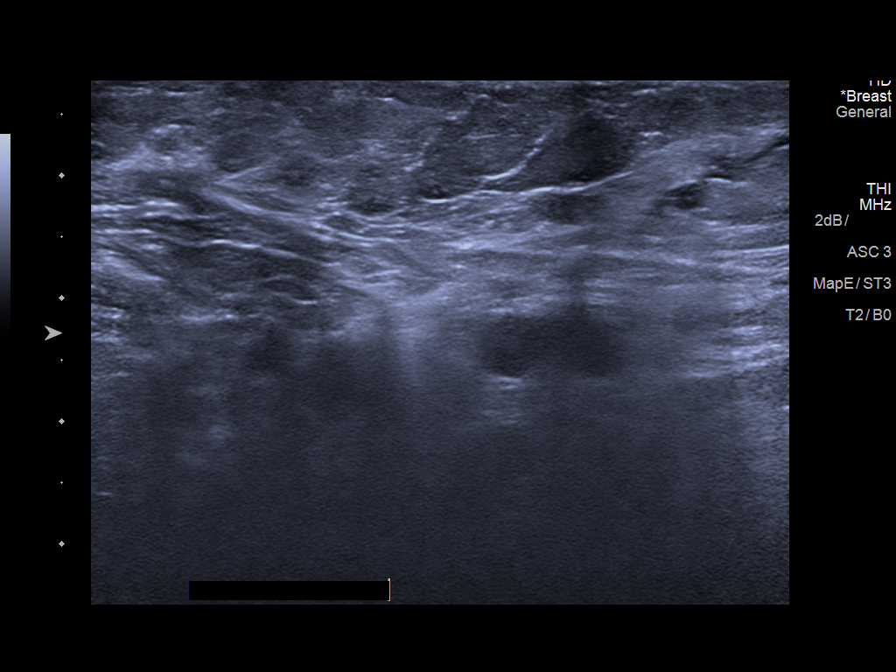

[8 of 8 positions shown; findings below may reference images not displayed]



1.  Lesion quadrant: Upper outer quadrant

Using sterile technique and 1% Lidocaine as local anesthetic, under
direct ultrasound visualization, a 12 gauge JOSELITO device was
used to perform biopsy of a mass in the left breast at 1 o'clock
using a lateral approach. At the conclusion of the procedure a coil
tissue marker clip was deployed into the biopsy cavity. Follow up 2
view mammogram was performed and dictated separately.

2.  Axilla

Using sterile technique and 1% Lidocaine as local anesthetic, under
direct ultrasound visualization, a 14 gauge JOSELITO device was
used to perform biopsy of a left axillary lymph node using a lateral
approach. At the conclusion of the procedure a HydroMARK tissue
marker clip was deployed into the biopsy cavity. Follow up 2 view
mammogram was performed and dictated separately.
IMPRESSION: Ultrasound guided biopsy of a left breast mass at 1 o'clock and a
left axillary lymph node. No apparent complications.

ADDENDUM:
PATHOLOGY revealed:

A. BREAST, LEFT AT [DATE]; ULTRASOUND-GUIDED CORE NEEDLE BIOPSY: -
INVASIVE MAMMARY CARCINOMA, NO SPECIAL TYPE. At least 9 mm in this
sample. Grade 2. Ductal carcinoma in situ: Not identified.
Lymphovascular invasion: Not identified.

B. AXILLA, LEFT; ULTRASOUND-GUIDED CORE NEEDLE BIOPSY: - INVASIVE
MAMMARY CARCINOMA, NO SPECIAL TYPE.

Comment: Invasive carcinoma within this specimen measures
approximately 1 mm in

greatest extent, and is histologically similar to tumor identified
in part A. No definitive lymph node tissue is identified.

C. BREAST, LEFT UPPER OUTER QUADRANT; STEREOTACTIC CORE NEEDLE
BIOPSY: - INVASIVE MAMMARY CARCINOMA, NO SPECIAL TYPE. - DUCTAL
CARCINOMA IN SITU, HIGH GRADE WITH COMEDONECROSIS, WITH ASSOCIATED
CALCIFICATIONS.

Comment: The specimen consists predominantly of ductal carcinoma in
situ, present in 7 of 8 tissue blocks, and measuring up to 2 mm in
greatest linear extent. DCIS is associated with calcifications.
Invasive carcinoma is identified within a single block (C7),
measuring 1 mm in greatest extent, and is histologically similar to
tumor seen in parts A and B. Hormone receptor testing will be
limited to specimen A.

Pathology results are CONCORDANT with imaging findings, per Dr.
JOSELITO.

Breast MRI is recommended for extent of disease.

Multiple attempts to contact patient, using [employer],
were unsuccessful. Notified JOSELITO RN at [HOSPITAL]
[HOSPITAL] (who is patient's [REDACTED] coordinator) and she will
contact patient with biopsy results and arrange surgical referral.

Addendum by JOSELITO RN on [DATE].



1.  Lesion quadrant: Upper outer quadrant

Using sterile technique and 1% Lidocaine as local anesthetic, under
direct ultrasound visualization, a 12 gauge JOSELITO device was
used to perform biopsy of a mass in the left breast at 1 o'clock
using a lateral approach. At the conclusion of the procedure a coil
tissue marker clip was deployed into the biopsy cavity. Follow up 2
view mammogram was performed and dictated separately.

2.  Axilla

Using sterile technique and 1% Lidocaine as local anesthetic, under
direct ultrasound visualization, a 14 gauge JOSELITO device was
used to perform biopsy of a left axillary lymph node using a lateral
approach. At the conclusion of the procedure a HydroMARK tissue
marker clip was deployed into the biopsy cavity. Follow up 2 view
mammogram was performed and dictated separately.
IMPRESSION: Ultrasound guided biopsy of a left breast mass at 1 o'clock and a
left axillary lymph node. No apparent complications.

## 2019-08-14 IMAGING — MG MM BREAST BX W LOC DEV 1ST LESION IMAGE BX SPEC STEREO GUIDE*L*
7 of 15 series · 7 of 23 positions shown · non-contrast
Comparison: Previous exams.
COMPARISON: Previous exams.

Addendum:
CLINICAL DATA: 38-year-old female presenting for biopsy of a left
breast mass, left axillary lymph node and left breast
calcifications.

EXAM:
LEFT BREAST STEREOTACTIC CORE NEEDLE BIOPSY

[L (1 of 7)]
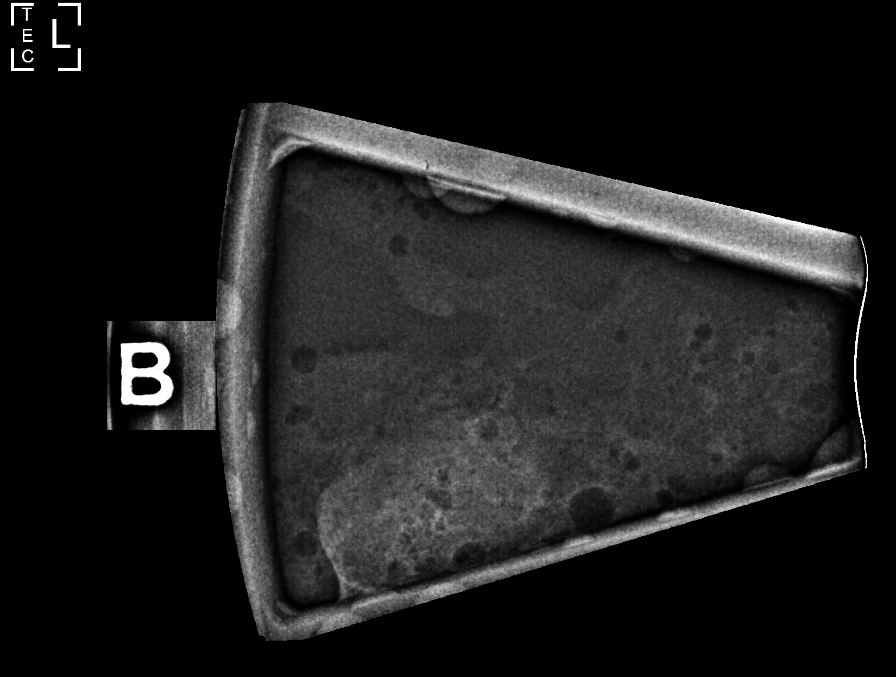

[L (2 of 7)]
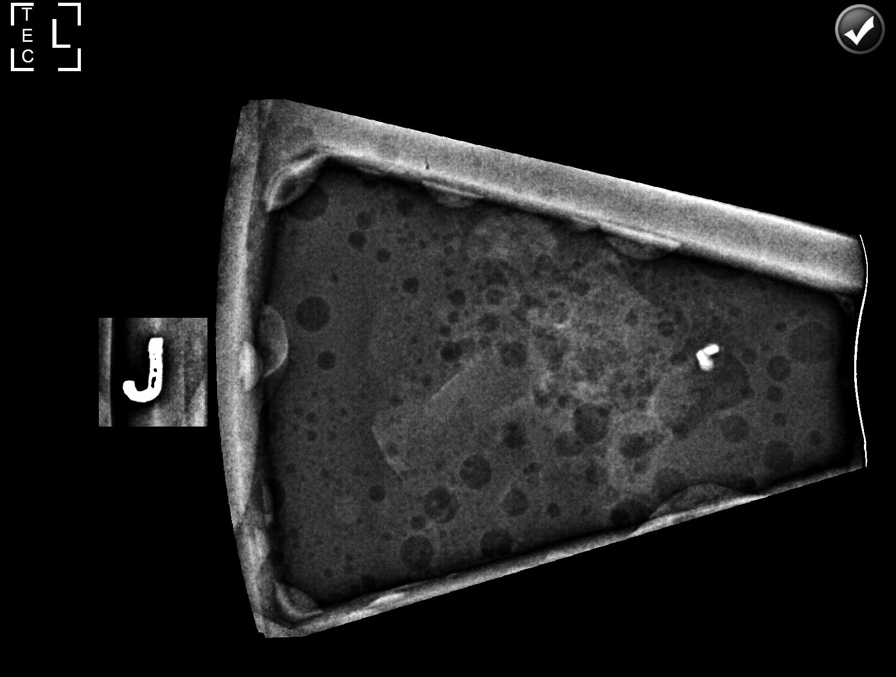

[L (3 of 7)]
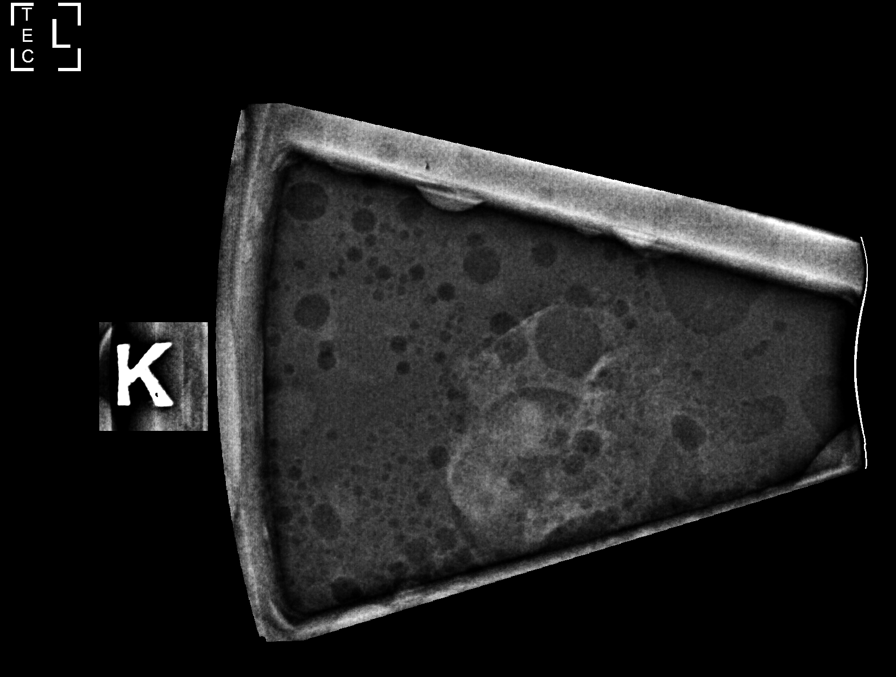

[L (4 of 7)]
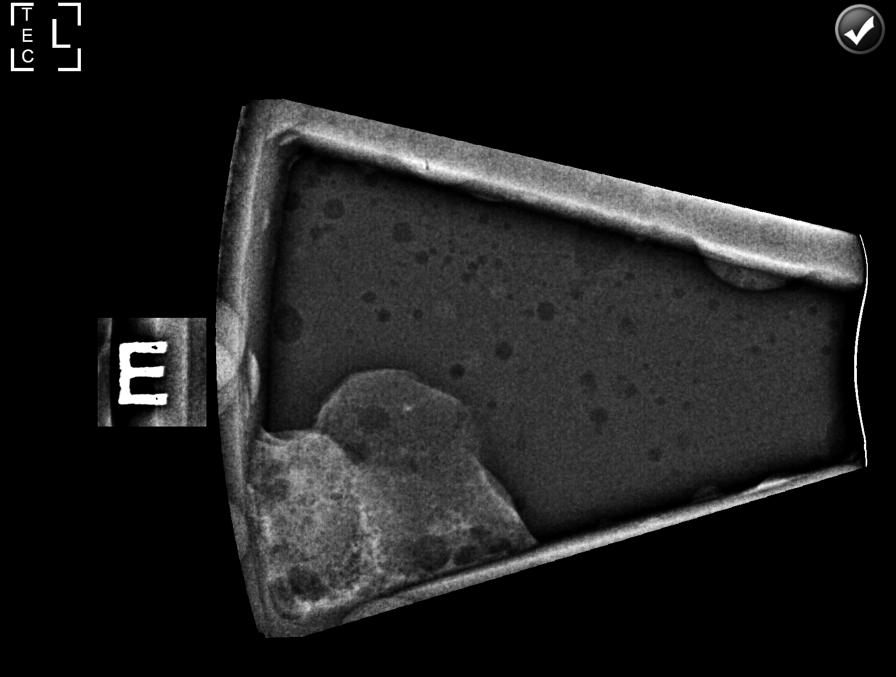

[L (5 of 7)]
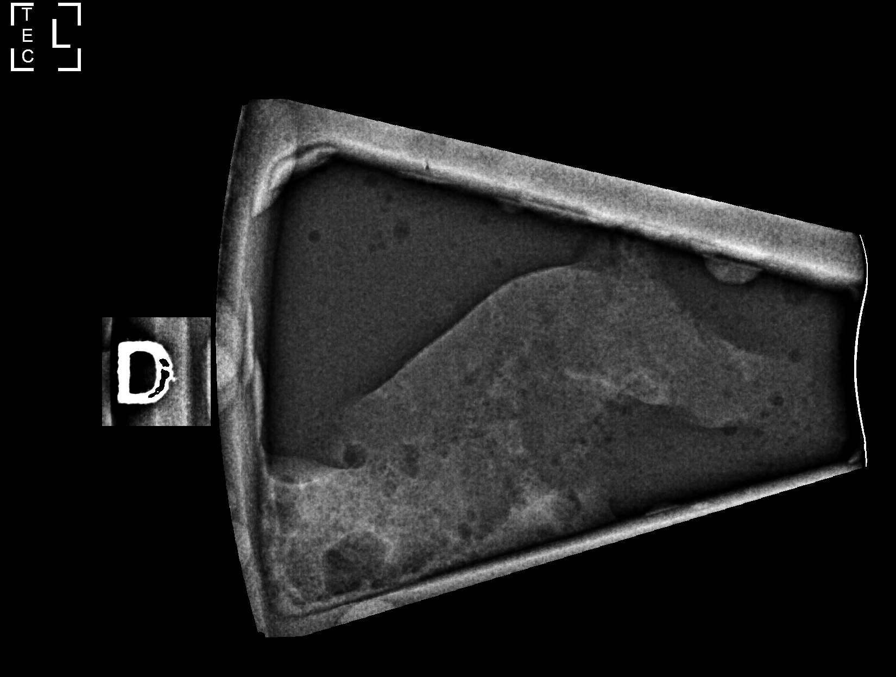

[L (6 of 7)]
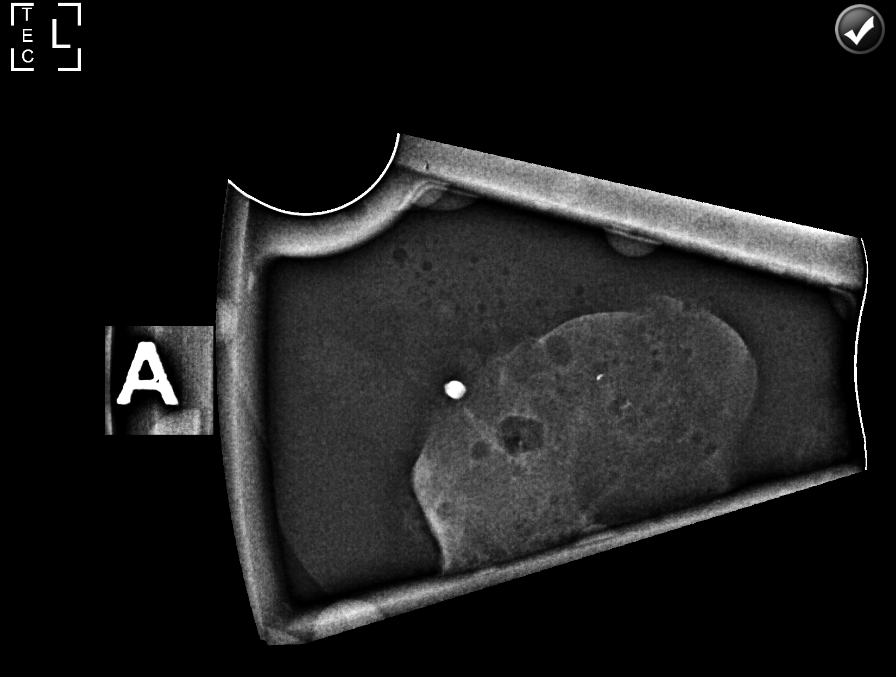

[L (7 of 7)]
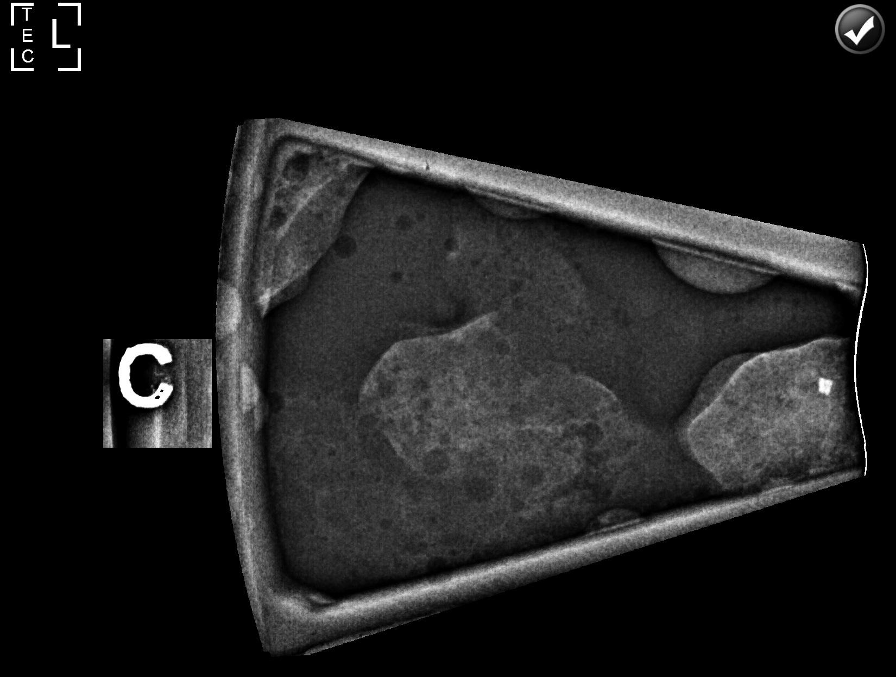

[7 of 23 positions shown; findings below may reference images not displayed]



Using sterile technique and 1% Lidocaine as local anesthetic, under
stereotactic guidance, a 9 gauge vacuum assisted device was used to
perform core needle biopsy of calcifications in the upper outer left
breast using a superior approach. Specimen radiograph was performed
showing at least 4 specimens with calcifications. Specimens with
calcifications are identified for pathology.

Lesion quadrant: Upper outer quadrant

At the conclusion of the procedure, an X tissue marker clip was
deployed into the biopsy cavity. Follow-up 2-view mammogram was
performed and dictated separately.
IMPRESSION: Stereotactic-guided biopsy of calcifications in the upper outer left
breast. No apparent complications.

ADDENDUM:
PATHOLOGY revealed:

A. BREAST, LEFT AT [DATE]; ULTRASOUND-GUIDED CORE NEEDLE BIOPSY: -
INVASIVE MAMMARY CARCINOMA, NO SPECIAL TYPE. At least 9 mm in this
sample. Grade 2. Ductal carcinoma in situ: Not identified.
Lymphovascular invasion: Not identified.

B. AXILLA, LEFT; ULTRASOUND-GUIDED CORE NEEDLE BIOPSY: - INVASIVE
MAMMARY CARCINOMA, NO SPECIAL TYPE.

Comment: Invasive carcinoma within this specimen measures
approximately 1 mm in

greatest extent, and is histologically similar to tumor identified
in part A. No definitive lymph node tissue is identified.

C. BREAST, LEFT UPPER OUTER QUADRANT; STEREOTACTIC CORE NEEDLE
BIOPSY: - INVASIVE MAMMARY CARCINOMA, NO SPECIAL TYPE. - DUCTAL
CARCINOMA IN SITU, HIGH GRADE WITH COMEDONECROSIS, WITH ASSOCIATED
CALCIFICATIONS.

Comment: The specimen consists predominantly of ductal carcinoma in
situ, present in 7 of 8 tissue blocks, and measuring up to 2 mm in
greatest linear extent. DCIS is associated with calcifications.
Invasive carcinoma is identified within a single block (C7),
measuring 1 mm in greatest extent, and is histologically similar to
tumor seen in parts A and B. Hormone receptor testing will be
limited to specimen A.

Pathology results are CONCORDANT with imaging findings, per Dr.
WAITS.

Breast MRI is recommended for extent of disease.

Multiple attempts to contact patient, using [employer],
were unsuccessful. Notified WAITS RN at [HOSPITAL]
[HOSPITAL] (who is patient's [REDACTED] coordinator) and she will
contact patient with biopsy results and arrange surgical referral.

Addendum by WAITS RN on [DATE].



Using sterile technique and 1% Lidocaine as local anesthetic, under
stereotactic guidance, a 9 gauge vacuum assisted device was used to
perform core needle biopsy of calcifications in the upper outer left
breast using a superior approach. Specimen radiograph was performed
showing at least 4 specimens with calcifications. Specimens with
calcifications are identified for pathology.

Lesion quadrant: Upper outer quadrant

At the conclusion of the procedure, an X tissue marker clip was
deployed into the biopsy cavity. Follow-up 2-view mammogram was
performed and dictated separately.
IMPRESSION: Stereotactic-guided biopsy of calcifications in the upper outer left
breast. No apparent complications.

## 2019-08-19 ENCOUNTER — Encounter: Payer: Self-pay | Admitting: *Deleted

## 2019-08-19 LAB — SURGICAL PATHOLOGY

## 2019-08-19 NOTE — Progress Notes (Signed)
Called patient today via [name], the interpreter.  Patient has a positive breast biopsy.  States she was given her results today at the [facility]  States she is waiting on a appointment at [facility].  She is not a [place] citizen or have permament residency, therefore I cannot apply for [employer] Medicaid for her.  States she has applied for charity care at [facility] when she got her MRI.  Offered her an appointment at [facility] for surgical and medical oncology consult.  Explained she could apply for charity care here also.  I can see an appointment at [facility] with Dr. Queen Slough on 6/18 and medical oncology on 09/18/19.  Patient states she is not aware of any appointments, but wants to be seen as soon as possible.  She asked that I call her PCP Dr. Georgette Dover at [facility] and asked her what she should do.  Patient states "I can't think".  I did call and speak to Dr. Fuller Song.  Dr. Fuller Song would like to get patient in as soon as possible, and is agreeable to [facility].  I tried to call patient back to go over my discussion with Dr. Georgette Dover and get her scheduled to see medical oncology here at [facility].  Left message for her to return my call.  I will try again tomorrow.

## 2019-08-20 ENCOUNTER — Encounter: Payer: Self-pay | Admitting: *Deleted

## 2019-08-20 NOTE — Progress Notes (Signed)
Spoke to patient today via [name], the interpreter.  Reviewed discussion with Dr. Georgette Dover and need to schedule her as soon as possible with medical oncology.  I have her scheduled to see Dr. Janese Banks on 08/26/19 at 1:30.  Patient is agreeable.  We discussed need to apply for patient financial assistance through [facility]  [name] is going to meet patient today to give her the application.  Will give educational material at that appointment.

## 2019-08-21 ENCOUNTER — Other Ambulatory Visit: Payer: Self-pay | Admitting: *Deleted

## 2019-08-21 ENCOUNTER — Encounter: Payer: Self-pay | Admitting: *Deleted

## 2019-08-21 DIAGNOSIS — R937 Abnormal findings on diagnostic imaging of other parts of musculoskeletal system: Secondary | ICD-10-CM

## 2019-08-21 DIAGNOSIS — C50912 Malignant neoplasm of unspecified site of left female breast: Secondary | ICD-10-CM

## 2019-08-21 NOTE — Progress Notes (Signed)
Discussed case with Dr. Janese Banks.  She would like to see patient tomorrow instead of Tuesday.  I have left her a message via Ronnald Collum, the interpreter to call me back.

## 2019-08-22 ENCOUNTER — Inpatient Hospital Stay: Payer: Self-pay

## 2019-08-22 ENCOUNTER — Encounter: Payer: Self-pay | Admitting: Oncology

## 2019-08-22 ENCOUNTER — Other Ambulatory Visit: Payer: Self-pay

## 2019-08-22 ENCOUNTER — Other Ambulatory Visit: Payer: Self-pay | Admitting: *Deleted

## 2019-08-22 ENCOUNTER — Inpatient Hospital Stay: Payer: Self-pay | Attending: Oncology | Admitting: Oncology

## 2019-08-22 VITALS — BP 112/54 | HR 138 | Temp 98.2°F | Resp 18 | Wt 139.2 lb

## 2019-08-22 DIAGNOSIS — Z7189 Other specified counseling: Secondary | ICD-10-CM

## 2019-08-22 DIAGNOSIS — Z791 Long term (current) use of non-steroidal anti-inflammatories (NSAID): Secondary | ICD-10-CM | POA: Insufficient documentation

## 2019-08-22 DIAGNOSIS — C50412 Malignant neoplasm of upper-outer quadrant of left female breast: Secondary | ICD-10-CM | POA: Insufficient documentation

## 2019-08-22 DIAGNOSIS — Z7952 Long term (current) use of systemic steroids: Secondary | ICD-10-CM | POA: Insufficient documentation

## 2019-08-22 DIAGNOSIS — C7951 Secondary malignant neoplasm of bone: Secondary | ICD-10-CM | POA: Insufficient documentation

## 2019-08-22 DIAGNOSIS — Z5111 Encounter for antineoplastic chemotherapy: Secondary | ICD-10-CM | POA: Insufficient documentation

## 2019-08-22 DIAGNOSIS — C50912 Malignant neoplasm of unspecified site of left female breast: Secondary | ICD-10-CM

## 2019-08-22 DIAGNOSIS — Z5112 Encounter for antineoplastic immunotherapy: Secondary | ICD-10-CM | POA: Insufficient documentation

## 2019-08-22 DIAGNOSIS — M549 Dorsalgia, unspecified: Secondary | ICD-10-CM | POA: Insufficient documentation

## 2019-08-22 DIAGNOSIS — Z79899 Other long term (current) drug therapy: Secondary | ICD-10-CM | POA: Insufficient documentation

## 2019-08-22 DIAGNOSIS — Z171 Estrogen receptor negative status [ER-]: Secondary | ICD-10-CM | POA: Insufficient documentation

## 2019-08-22 DIAGNOSIS — G893 Neoplasm related pain (acute) (chronic): Secondary | ICD-10-CM | POA: Insufficient documentation

## 2019-08-22 DIAGNOSIS — R945 Abnormal results of liver function studies: Secondary | ICD-10-CM | POA: Insufficient documentation

## 2019-08-22 DIAGNOSIS — R930 Abnormal findings on diagnostic imaging of skull and head, not elsewhere classified: Secondary | ICD-10-CM | POA: Insufficient documentation

## 2019-08-22 DIAGNOSIS — R937 Abnormal findings on diagnostic imaging of other parts of musculoskeletal system: Secondary | ICD-10-CM

## 2019-08-22 DIAGNOSIS — F419 Anxiety disorder, unspecified: Secondary | ICD-10-CM | POA: Insufficient documentation

## 2019-08-22 LAB — COMPREHENSIVE METABOLIC PANEL
ALT: 32 U/L (ref 0–44)
AST: 28 U/L (ref 15–41)
Albumin: 3.9 g/dL (ref 3.5–5.0)
Alkaline Phosphatase: 96 U/L (ref 38–126)
Anion gap: 9 (ref 5–15)
BUN: 13 mg/dL (ref 6–20)
CO2: 27 mmol/L (ref 22–32)
Calcium: 8.9 mg/dL (ref 8.9–10.3)
Chloride: 103 mmol/L (ref 98–111)
Creatinine, Ser: 0.78 mg/dL (ref 0.44–1.00)
GFR calc Af Amer: >60 mL/min (ref >60)
GFR calc non Af Amer: >60 mL/min (ref >60)
Glucose, Bld: 111 mg/dL — ABNORMAL HIGH (ref 70–99)
Potassium: 4.1 mmol/L (ref 3.5–5.1)
Sodium: 139 mmol/L (ref 135–145)
Total Bilirubin: 0.6 mg/dL (ref 0.3–1.2)
Total Protein: 7.6 g/dL (ref 6.5–8.1)

## 2019-08-22 LAB — CBC WITH DIFFERENTIAL/PLATELET
Abs Immature Granulocytes: 0.04 10*3/uL (ref 0.00–0.07)
Basophils Absolute: 0 10*3/uL (ref 0.0–0.1)
Basophils Relative: 0 %
Eosinophils Absolute: 0.1 10*3/uL (ref 0.0–0.5)
Eosinophils Relative: 1 %
HCT: 39.5 % (ref 36.0–46.0)
Hemoglobin: 13.3 g/dL (ref 12.0–15.0)
Immature Granulocytes: 1 %
Lymphocytes Relative: 12 %
Lymphs Abs: 1.1 10*3/uL (ref 0.7–4.0)
MCH: 29.6 pg (ref 26.0–34.0)
MCHC: 33.7 g/dL (ref 30.0–36.0)
MCV: 87.8 fL (ref 80.0–100.0)
Monocytes Absolute: 0.5 10*3/uL (ref 0.1–1.0)
Monocytes Relative: 6 %
Neutro Abs: 7 10*3/uL (ref 1.7–7.7)
Neutrophils Relative %: 80 %
Platelets: 305 10*3/uL (ref 150–400)
RBC: 4.5 MIL/uL (ref 3.87–5.11)
RDW: 12.9 % (ref 11.5–15.5)
WBC: 8.8 10*3/uL (ref 4.0–10.5)
nRBC: 0 % (ref 0.0–0.2)

## 2019-08-22 MED ORDER — DEXAMETHASONE 4 MG PO TABS
8.0000 mg | ORAL_TABLET | Freq: Two times a day (BID) | ORAL | 0 refills | Status: DC
Start: 2019-08-22 — End: 2019-09-04

## 2019-08-22 MED ORDER — LORAZEPAM 0.5 MG PO TABS
0.5000 mg | ORAL_TABLET | Freq: Four times a day (QID) | ORAL | 0 refills | Status: DC | PRN
Start: 2019-08-22 — End: 2019-09-11

## 2019-08-22 MED ORDER — LORAZEPAM 0.5 MG PO TABS
0.5000 mg | ORAL_TABLET | Freq: Once | ORAL | Status: AC
  Administered 2019-08-22: 0.5 mg via ORAL
  Filled 2019-08-22: qty 1

## 2019-08-22 MED ORDER — OXYCODONE HCL 5 MG PO TABS: 5.0000 mg | ORAL_TABLET | ORAL | 0 refills | Status: DC | PRN

## 2019-08-22 NOTE — Progress Notes (Signed)
Patient here for initial oncology appointment,very nervous. Complains of chest pain, shortness of breath, muscle spasms and weakness. MD aware.

## 2019-08-23 LAB — CANCER ANTIGEN 27.29: CA 27.29: 170.2 U/mL — ABNORMAL HIGH (ref 0.0–38.6)

## 2019-08-23 LAB — CANCER ANTIGEN 15-3: CA 15-3: 155 U/mL — ABNORMAL HIGH (ref 0.0–25.0)

## 2019-08-25 ENCOUNTER — Telehealth (INDEPENDENT_AMBULATORY_CARE_PROVIDER_SITE_OTHER): Payer: Self-pay

## 2019-08-25 ENCOUNTER — Ambulatory Visit
Admission: RE | Admit: 2019-08-25 | Discharge: 2019-08-25 | Disposition: A | Discharge status: Home / Self Care | Payer: Self-pay | Source: Ambulatory Visit | Attending: Oncology | Admitting: Oncology

## 2019-08-25 ENCOUNTER — Other Ambulatory Visit: Payer: Self-pay

## 2019-08-25 ENCOUNTER — Telehealth: Payer: Self-pay | Admitting: *Deleted

## 2019-08-25 DIAGNOSIS — C50912 Malignant neoplasm of unspecified site of left female breast: Secondary | ICD-10-CM

## 2019-08-25 DIAGNOSIS — R937 Abnormal findings on diagnostic imaging of other parts of musculoskeletal system: Secondary | ICD-10-CM

## 2019-08-25 IMAGING — MR MR CERVICAL SPINE WO/W CM
5 of 8 series · 26 of 48 positions shown · IV contrast (6ml Gadavist)
Comparison: No comparison studies are available at the time of
dictation

CLINICAL DATA: Back pain with bilateral lower extremity
radiculopathy. Breast cancer. Reported history of abnormal lumbar
spine MRI

EXAM:
MRI CERVICAL, THORACIC AND LUMBAR SPINE WITHOUT AND WITH CONTRAST
TECHNIQUE: Multiplanar and multiecho pulse sequences of the cervical spine, to
include the craniocervical junction and cervicothoracic junction,
and thoracic and lumbar spine, were obtained without and with
intravenous contrast.
CONTRAST:  6 mL Gadavist IV contrast

[Series 5: T2 · sagittal · 3.0mm · 0.62mm/px · 4 of 15 slices shown (1 of 2)]
[im 1/15]
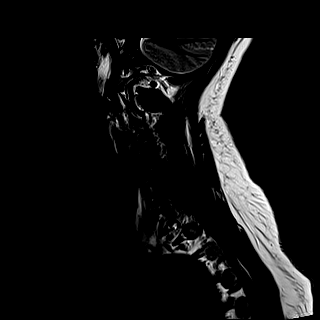
[im 5/15]
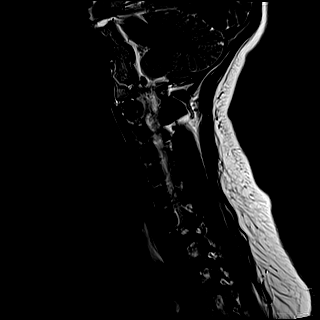
[im 10/15]
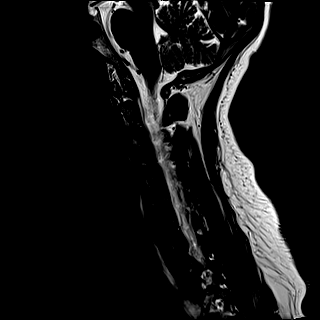
[im 15/15]
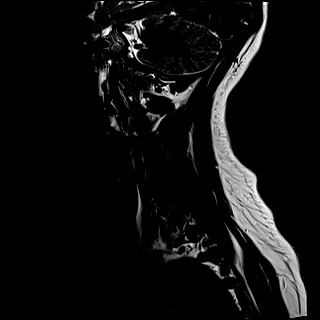

[Series 7: STIR · sagittal · 3.0mm · 0.62mm/px · 4 of 15 slices shown]
[im 1/15]
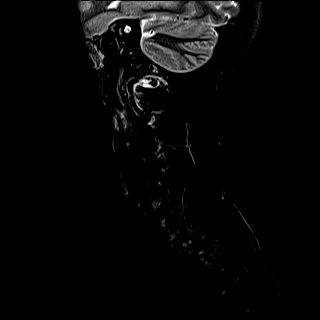
[im 5/15]
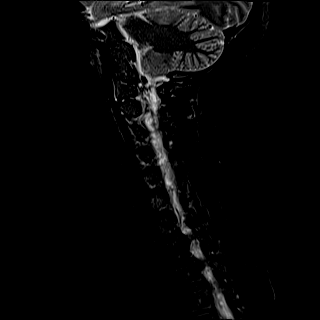
[im 10/15]
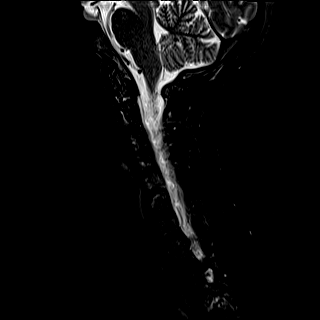
[im 15/15]
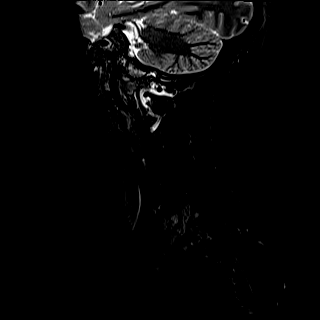

[Series 9: T2 · axial · 3.0mm · 0.70mm/px · z∈[-131,-27]mm · 8 of 31 slices shown (2 of 2)]
[im 1/31]
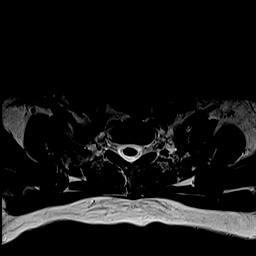
[im 5/31]
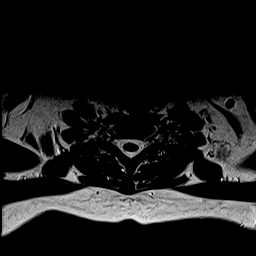
[im 9/31]
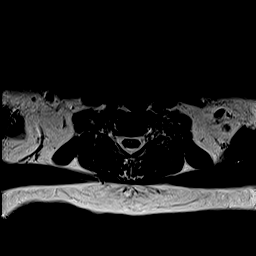
[im 13/31]
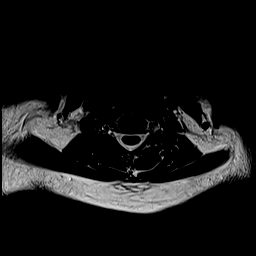
[im 18/31]
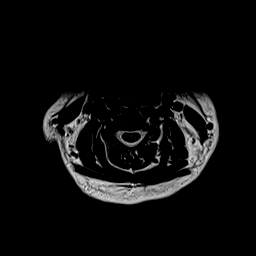
[im 22/31]
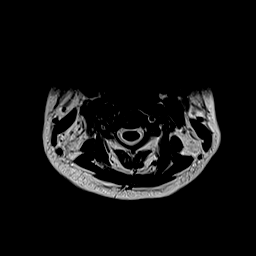
[im 26/31]
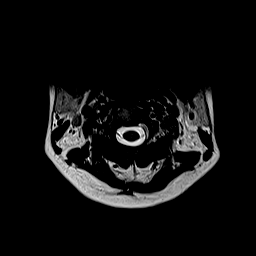
[im 31/31]
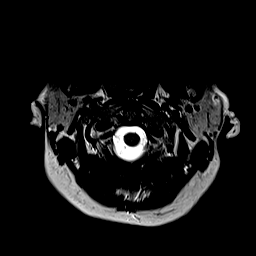

[Series 10: T1 · axial · non-contrast · 3.0mm · 0.35mm/px · z∈[-131,-27]mm · 8 of 31 slices shown]
[im 1/31]
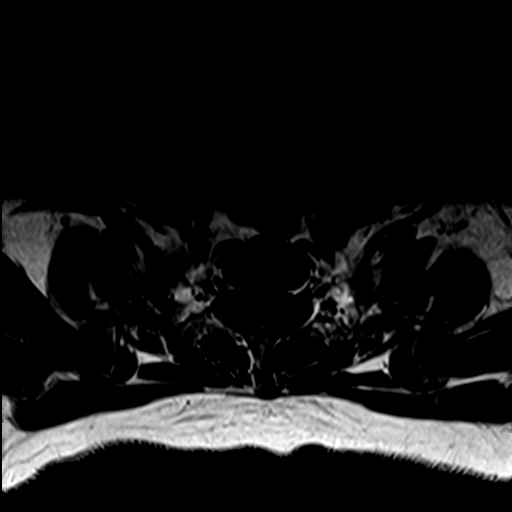
[im 5/31]
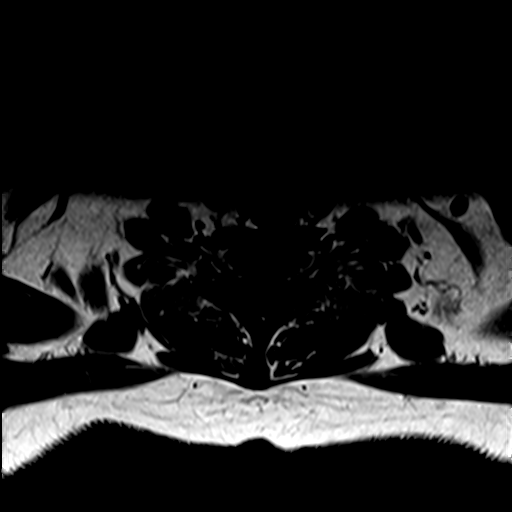
[im 9/31]
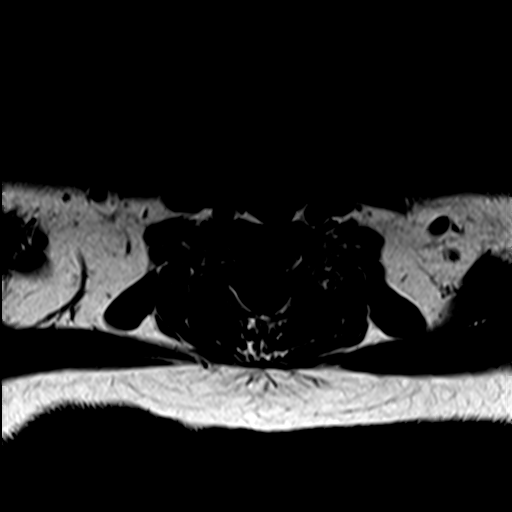
[im 13/31]
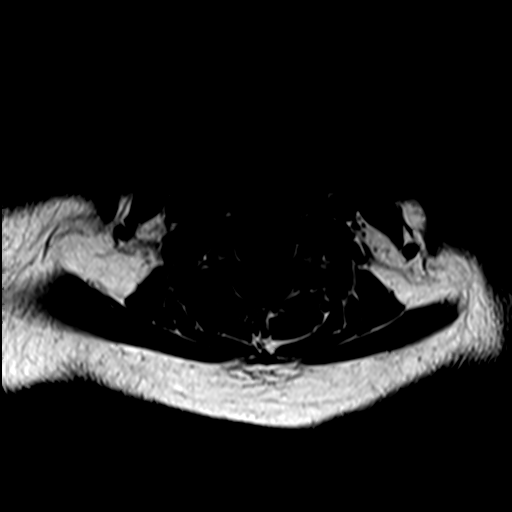
[im 18/31]
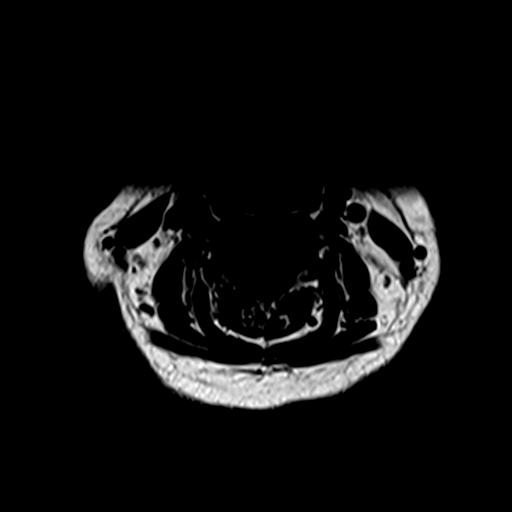
[im 22/31]
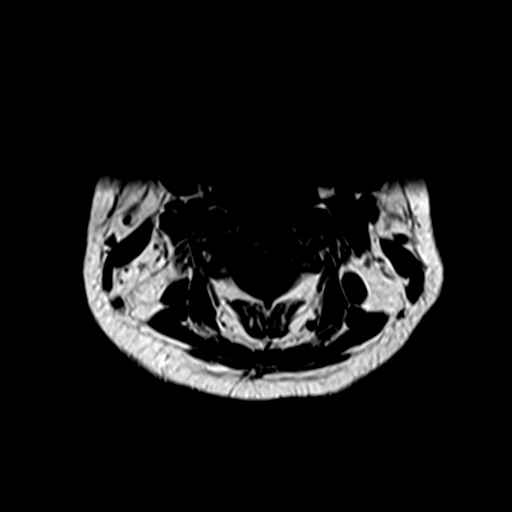
[im 26/31]
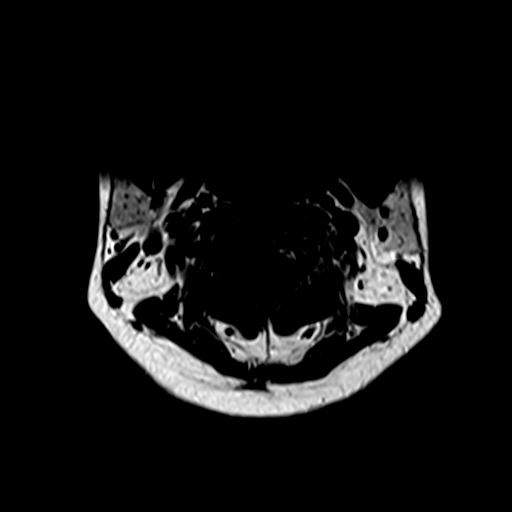
[im 31/31]
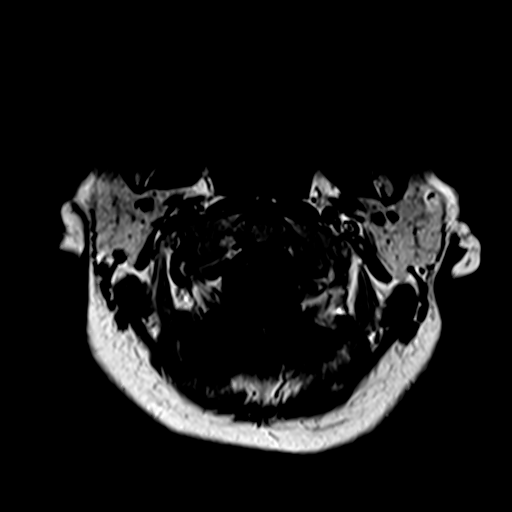

[Series 12: T1 post-contrast · axial · 3.0mm · 0.35mm/px · z∈[-131,-117]mm · 2 of 31 slices shown]
[im 1/31]
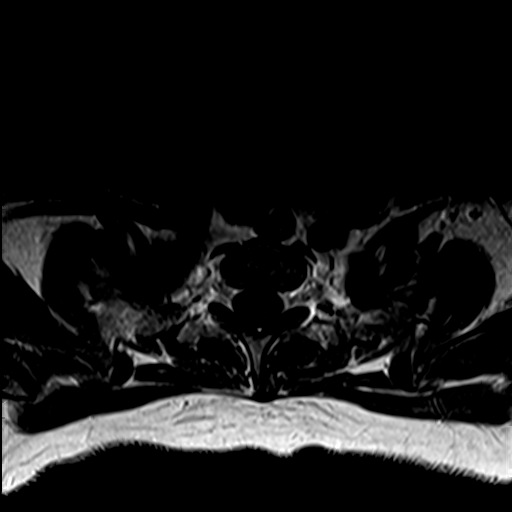
[im 5/31]
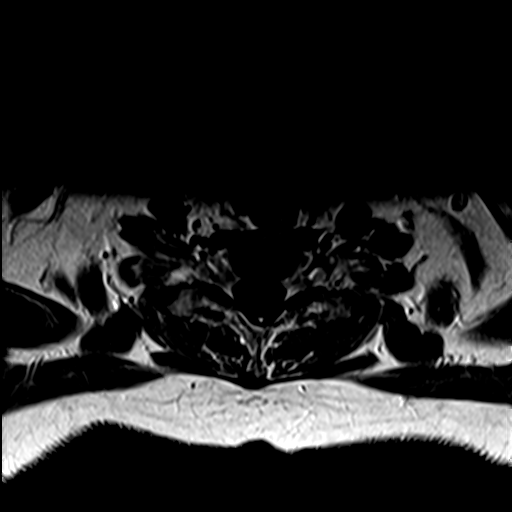

[26 of 48 positions shown; findings below may reference images not displayed]

FINDINGS: MRI CERVICAL SPINE FINDINGS

Alignment: No static listhesis. Straightening of the cervical
lordosis.

Vertebrae: Rounded enhancing 9 mm lesion within the mid C2 vertebral
body. Additional enhancing lesion centered within the left posterior
aspect of the C5 vertebral body measuring up to 10 mm. No enhancing
soft tissue mass. No evidence of epidural involvement or extension.

Cord: Normal signal and morphology. No cord expansion or focal cord
lesion.

Posterior Fossa, vertebral arteries, paraspinal tissues: Enhancing
marrow replacing lesion involving the left occipital condyle
measuring 1.8 cm (series 6, image 14). No abnormality within the
visualized posterior fossa. Paraspinal soft tissues are within
normal limits.

Disc levels: No significant disc protrusion, foraminal stenosis, or
canal stenosis at any level. Cervical intervertebral discs remain
well hydrated with preservation of disc height. Normal cervical
facet joints.

MRI THORACIC SPINE FINDINGS

Alignment: No static listhesis. 4 mm of bony retropulsion related to
pathologic fracture of the T6 vertebrae.

Vertebrae: Rounded 7 mm enhancing lesion within the anterior aspect
of the T4 vertebral body. Small 2-3 mm lesion within the T5
vertebral body. Diffuse metastatic involvement of the T6 level with
acute pathologic fracture where there is approximately 50% vertebral
body height loss and 4 mm of bony retropulsion. Small 2-3 mm
enhancing lesion near the right T8 pedicle. 9 mm enhancing lesion
near the left T9 pedicle. 7 mm enhancing metastatic lesion in the
anterior aspect of the T10 vertebral body. Multiple enhancing foci
within the T12 vertebral body where there is a chronic appearing
mild superior endplate compression fracture without residual marrow
edema. No enhancing soft tissue mass. No evidence of epidural
involvement or extension.

There is also a enhancing expansile rib lesion involving the
posterior right third rib (series 25, image 7. Rib lesion measures
approximately 2.9 x 1.3 cm. Suspect metastatic lesion of the
manubrium (series 26, image 12).

Cord: Normal signal and morphology. No cord expansion or focal cord
lesion.

Paraspinal and other soft tissues: No acute findings.

Disc levels:

Thoracic vertebral body heights are maintained with well-hydrated
discs. There is no degenerative disc or facet arthropathy of the
lumbar spine. No disc protrusion. Bony retropulsion at T6 contacts
the ventral cord without canal stenosis.

MRI LUMBAR SPINE FINDINGS

Segmentation:  Standard.

Alignment: No static listhesis. 4 mm of bony retropulsion related to
pathologic fracture of the L1 vertebral body.

Vertebrae: Diffuse metastatic lesion throughout the L1 vertebral
body with acute pathologic compression fracture with approximately
75% vertebral body height loss centrally and 4 mm of bony
retropulsion. Enhancing 9 mm lesion at the superior endplate and
right pedicle of L2. A few tiny enhancing foci are evident within
the L3 and L5 vertebral bodies. Large enhancing lesion throughout
the L4 vertebral body with acute pathologic fracture and
approximately 60% vertebral body height loss centrally.

Additional areas of enhancing metastatic disease are seen throughout
the sacrum, most predominantly involving the lateral right sacral
ala (series 6, image 3) rounded enhancing metastatic lesion within
the right ilium adjacent to the right SI joint (series 7, image 35).

Conus medullaris and cauda equina: Conus extends to the L1 level.
Conus and cauda equina appear unremarkable without abnormal
enhancement.

Paraspinal and other soft tissues: Negative.

Disc levels:

T12-L1: No significant disc protrusion, foraminal stenosis, or canal
stenosis.

Bony retropulsion of the L1 vertebral body results in mild canal
stenosis.

L1-L2: No significant disc protrusion, foraminal stenosis, or canal
stenosis.

L2-L3: No significant disc protrusion, foraminal stenosis, or canal
stenosis.

L3-L4: Minimal disc bulge without foraminal or canal stenosis.

L4-L5: Mild disc bulge, eccentric to the left without evidence of
foraminal or canal stenosis.

L5-S1: Diffuse disc bulge with a superimposed small central disc
protrusion contacting the ventral thecal sac without canal stenosis.
Bilateral foramina remain patent.
IMPRESSION: 1. Multiple enhancing metastatic lesions throughout the cervical,
thoracic, and lumbar spine. There is acute pathologic compression
fractures of T6, L1, and L4, as described above. Mild canal stenosis
related to retropulsion of the L1 vertebral body. There is no
high-grade foraminal or canal stenosis at any spinal level.
2. No evidence of extraosseous spinal soft tissue mass or epidural
involvement.
3. Expansile posterior right third rib metastatic lesion.
4. Additional metastatic lesions are seen within the left occipital
condyle, manubrium, sacrum, and posterior right ilium.

These results will be called to the ordering clinician or
representative by the Radiologist Assistant, and communication
documented in the PACS or [REDACTED].

## 2019-08-25 IMAGING — MR MR THORACIC SPINE WO/W CM
6 of 11 series · 26 of 48 positions shown · IV contrast (gadavist)
Comparison: No comparison studies are available at the time of
dictation

CLINICAL DATA: Back pain with bilateral lower extremity
radiculopathy. Breast cancer. Reported history of abnormal lumbar
spine MRI

EXAM:
MRI CERVICAL, THORACIC AND LUMBAR SPINE WITHOUT AND WITH CONTRAST
TECHNIQUE: Multiplanar and multiecho pulse sequences of the cervical spine, to
include the craniocervical junction and cervicothoracic junction,
and thoracic and lumbar spine, were obtained without and with
intravenous contrast.
CONTRAST:  6 mL Gadavist IV contrast

[Series 16: T1 · sagittal · 5.0mm · 1.88mm/px · 2 of 9 slices shown (1 of 3)]
[im 1/9]
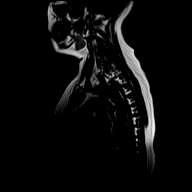
[im 9/9]
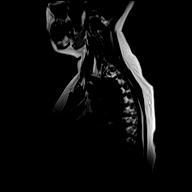

[Series 17: T2 · sagittal · 3.0mm · 1.06mm/px · 3 of 17 slices shown (1 of 3)]
[im 1/17]
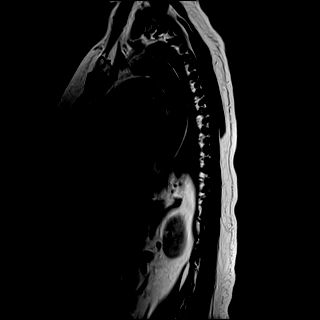
[im 9/17]
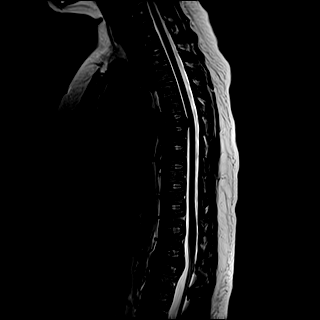
[im 17/17]
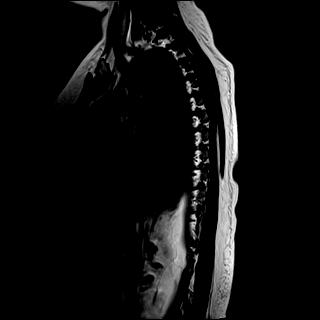

[Series 18: T1 · sagittal · 3.0mm · 1.06mm/px · 3 of 17 slices shown (2 of 3)]
[im 1/17]
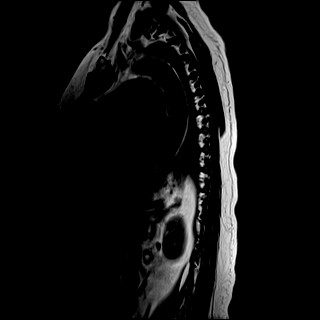
[im 9/17]
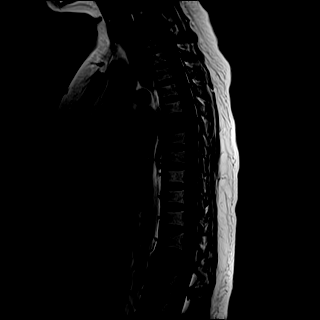
[im 17/17]
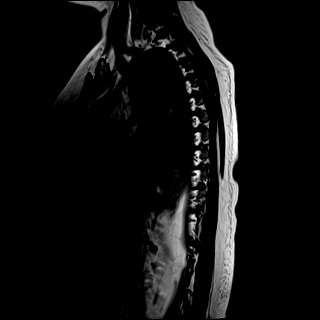

[Series 20: T2 · axial · 4.0mm · 0.59mm/px · z∈[-350,-138]mm · 6 of 42 slices shown (2 of 3)]
[im 1/42]
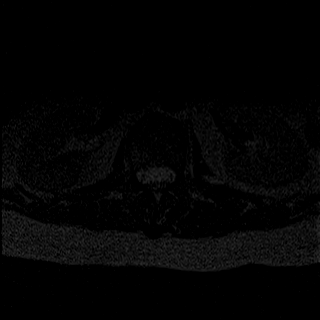
[im 9/42]
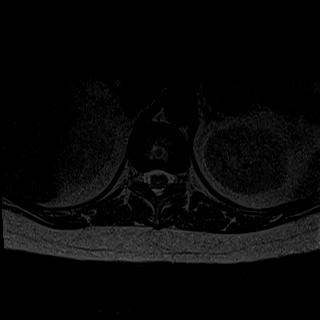
[im 17/42]
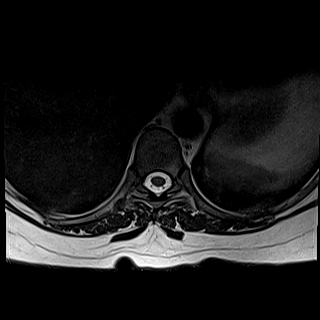
[im 25/42]
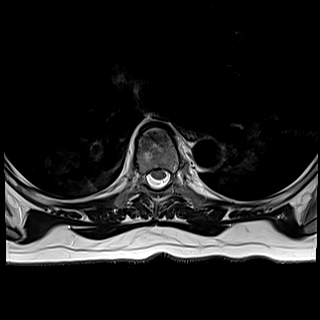
[im 33/42]
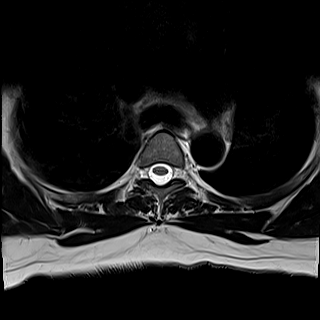
[im 42/42]
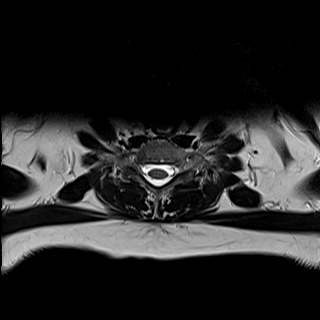

[Series 22: T1 · axial · non-contrast · 4.0mm · 0.31mm/px · z∈[-350,-138]mm · 6 of 42 slices shown (3 of 3)]
[im 1/42]
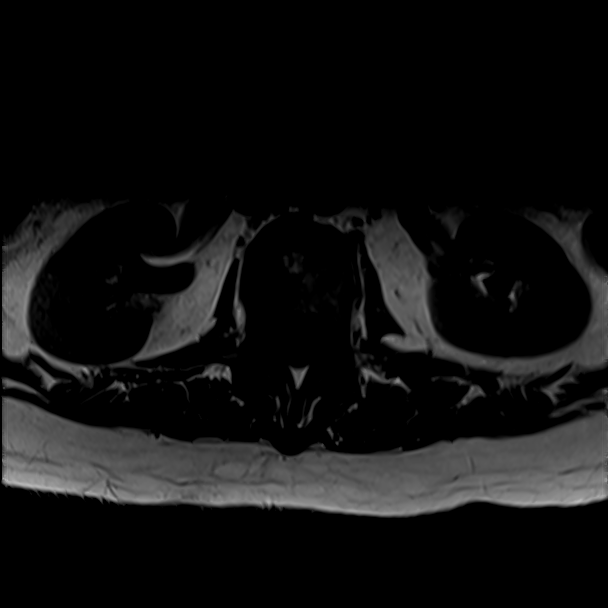
[im 9/42]
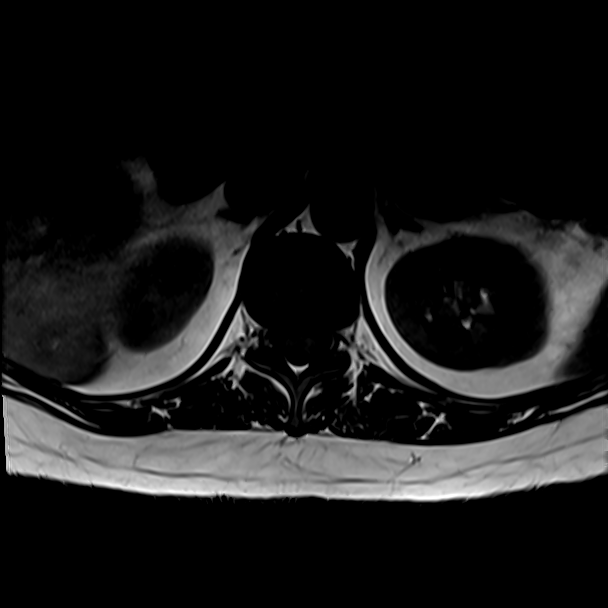
[im 17/42]
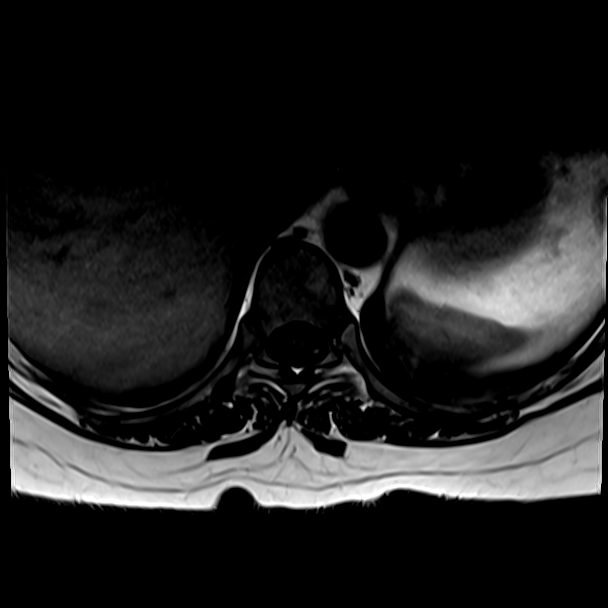
[im 25/42]
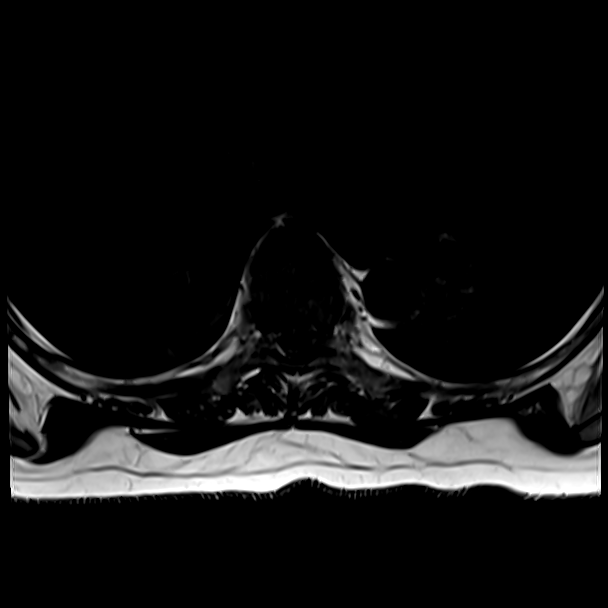
[im 33/42]
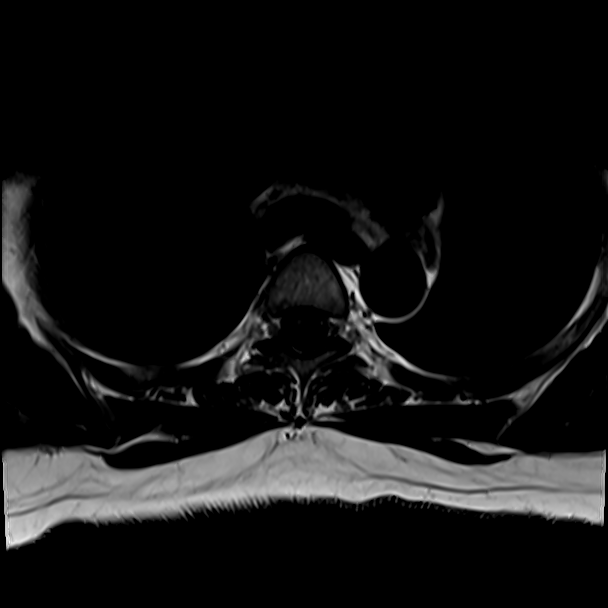
[im 42/42]
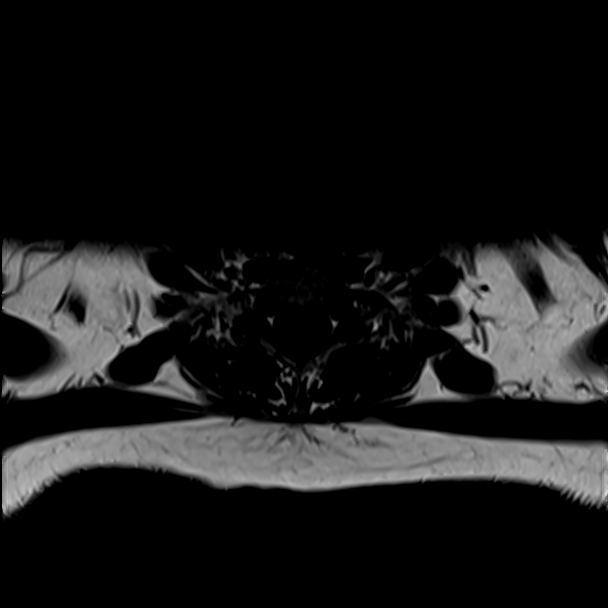

[Series 24: T2 · axial · 4.0mm · 0.59mm/px · z∈[-350,-135]mm · 6 of 42 slices shown (3 of 3)]
[im 1/42]
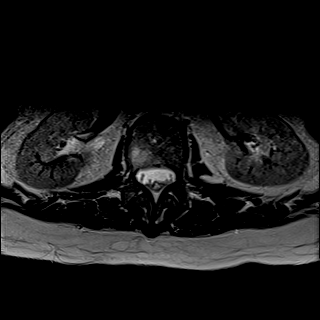
[im 9/42]
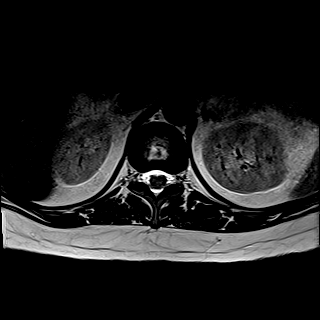
[im 17/42]
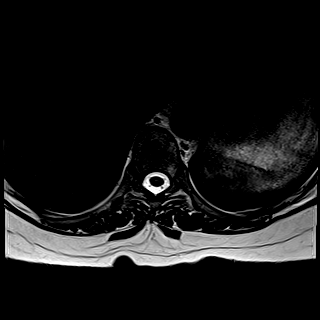
[im 25/42]
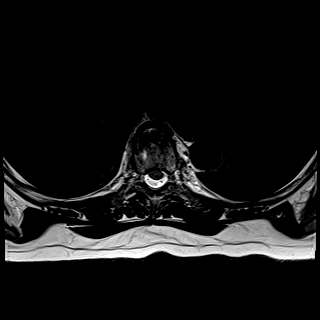
[im 33/42]
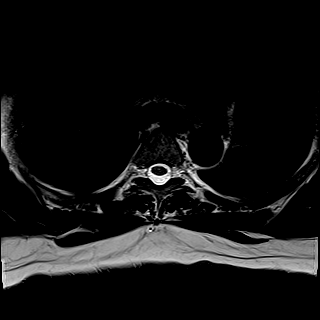
[im 42/42]
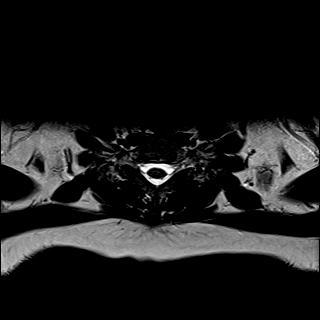

[26 of 48 positions shown; findings below may reference images not displayed]

FINDINGS: MRI CERVICAL SPINE FINDINGS

Alignment: No static listhesis. Straightening of the cervical
lordosis.

Vertebrae: Rounded enhancing 9 mm lesion within the mid C2 vertebral
body. Additional enhancing lesion centered within the left posterior
aspect of the C5 vertebral body measuring up to 10 mm. No enhancing
soft tissue mass. No evidence of epidural involvement or extension.

Cord: Normal signal and morphology. No cord expansion or focal cord
lesion.

Posterior Fossa, vertebral arteries, paraspinal tissues: Enhancing
marrow replacing lesion involving the left occipital condyle
measuring 1.8 cm (series 6, image 14). No abnormality within the
visualized posterior fossa. Paraspinal soft tissues are within
normal limits.

Disc levels: No significant disc protrusion, foraminal stenosis, or
canal stenosis at any level. Cervical intervertebral discs remain
well hydrated with preservation of disc height. Normal cervical
facet joints.

MRI THORACIC SPINE FINDINGS

Alignment: No static listhesis. 4 mm of bony retropulsion related to
pathologic fracture of the T6 vertebrae.

Vertebrae: Rounded 7 mm enhancing lesion within the anterior aspect
of the T4 vertebral body. Small 2-3 mm lesion within the T5
vertebral body. Diffuse metastatic involvement of the T6 level with
acute pathologic fracture where there is approximately 50% vertebral
body height loss and 4 mm of bony retropulsion. Small 2-3 mm
enhancing lesion near the right T8 pedicle. 9 mm enhancing lesion
near the left T9 pedicle. 7 mm enhancing metastatic lesion in the
anterior aspect of the T10 vertebral body. Multiple enhancing foci
within the T12 vertebral body where there is a chronic appearing
mild superior endplate compression fracture without residual marrow
edema. No enhancing soft tissue mass. No evidence of epidural
involvement or extension.

There is also a enhancing expansile rib lesion involving the
posterior right third rib (series 25, image 7. Rib lesion measures
approximately 2.9 x 1.3 cm. Suspect metastatic lesion of the
manubrium (series 26, image 12).

Cord: Normal signal and morphology. No cord expansion or focal cord
lesion.

Paraspinal and other soft tissues: No acute findings.

Disc levels:

Thoracic vertebral body heights are maintained with well-hydrated
discs. There is no degenerative disc or facet arthropathy of the
lumbar spine. No disc protrusion. Bony retropulsion at T6 contacts
the ventral cord without canal stenosis.

MRI LUMBAR SPINE FINDINGS

Segmentation:  Standard.

Alignment: No static listhesis. 4 mm of bony retropulsion related to
pathologic fracture of the L1 vertebral body.

Vertebrae: Diffuse metastatic lesion throughout the L1 vertebral
body with acute pathologic compression fracture with approximately
75% vertebral body height loss centrally and 4 mm of bony
retropulsion. Enhancing 9 mm lesion at the superior endplate and
right pedicle of L2. A few tiny enhancing foci are evident within
the L3 and L5 vertebral bodies. Large enhancing lesion throughout
the L4 vertebral body with acute pathologic fracture and
approximately 60% vertebral body height loss centrally.

Additional areas of enhancing metastatic disease are seen throughout
the sacrum, most predominantly involving the lateral right sacral
ala (series 6, image 3) rounded enhancing metastatic lesion within
the right ilium adjacent to the right SI joint (series 7, image 35).

Conus medullaris and cauda equina: Conus extends to the L1 level.
Conus and cauda equina appear unremarkable without abnormal
enhancement.

Paraspinal and other soft tissues: Negative.

Disc levels:

T12-L1: No significant disc protrusion, foraminal stenosis, or canal
stenosis.

Bony retropulsion of the L1 vertebral body results in mild canal
stenosis.

L1-L2: No significant disc protrusion, foraminal stenosis, or canal
stenosis.

L2-L3: No significant disc protrusion, foraminal stenosis, or canal
stenosis.

L3-L4: Minimal disc bulge without foraminal or canal stenosis.

L4-L5: Mild disc bulge, eccentric to the left without evidence of
foraminal or canal stenosis.

L5-S1: Diffuse disc bulge with a superimposed small central disc
protrusion contacting the ventral thecal sac without canal stenosis.
Bilateral foramina remain patent.
IMPRESSION: 1. Multiple enhancing metastatic lesions throughout the cervical,
thoracic, and lumbar spine. There is acute pathologic compression
fractures of T6, L1, and L4, as described above. Mild canal stenosis
related to retropulsion of the L1 vertebral body. There is no
high-grade foraminal or canal stenosis at any spinal level.
2. No evidence of extraosseous spinal soft tissue mass or epidural
involvement.
3. Expansile posterior right third rib metastatic lesion.
4. Additional metastatic lesions are seen within the left occipital
condyle, manubrium, sacrum, and posterior right ilium.

These results will be called to the ordering clinician or
representative by the Radiologist Assistant, and communication
documented in the PACS or [REDACTED].

## 2019-08-25 IMAGING — MR MR LUMBAR SPINE WO/W CM
5 of 7 series · 28 of 48 positions shown · IV contrast (gadavist)
Comparison: No comparison studies are available at the time of
dictation

CLINICAL DATA: Back pain with bilateral lower extremity
radiculopathy. Breast cancer. Reported history of abnormal lumbar
spine MRI

EXAM:
MRI CERVICAL, THORACIC AND LUMBAR SPINE WITHOUT AND WITH CONTRAST
TECHNIQUE: Multiplanar and multiecho pulse sequences of the cervical spine, to
include the craniocervical junction and cervicothoracic junction,
and thoracic and lumbar spine, were obtained without and with
intravenous contrast.
CONTRAST:  6 mL Gadavist IV contrast

[Series 1: T2 · sagittal · 4.0mm · 0.81mm/px · 4 of 17 slices shown (1 of 2)]
[im 1/17]
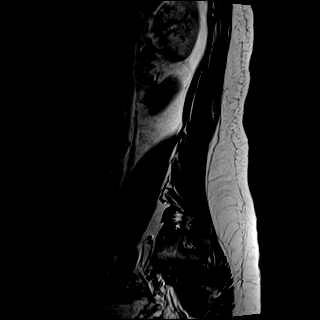
[im 6/17]
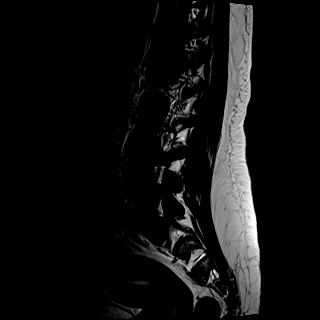
[im 11/17]
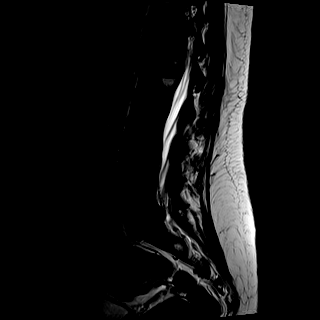
[im 17/17]
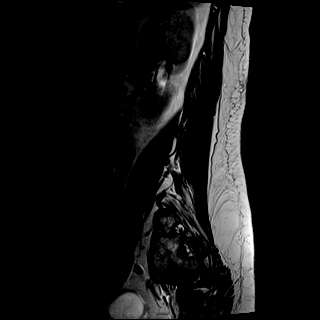

[Series 2: T1 · sagittal · 4.0mm · 0.81mm/px · 4 of 17 slices shown (1 of 2)]
[im 1/17]
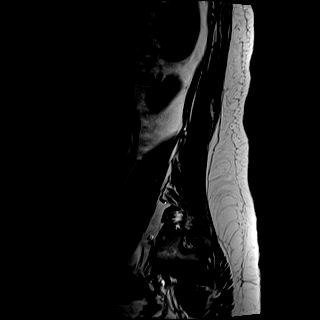
[im 6/17]
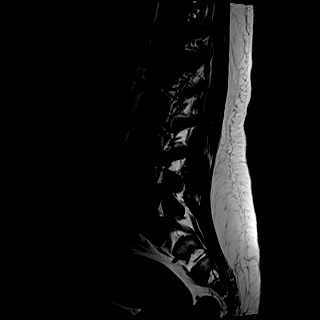
[im 11/17]
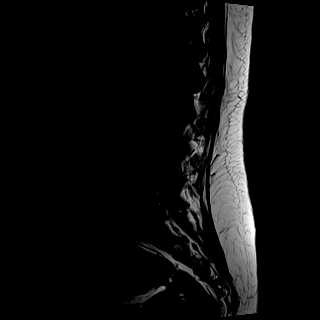
[im 17/17]
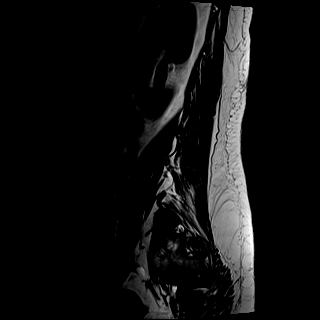

[Series 4: T2 · axial · 4.0mm · 0.78mm/px · z∈[-486,-289]mm · 8 of 35 slices shown (2 of 2)]
[im 1/35]
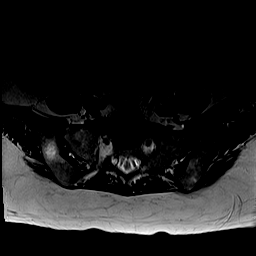
[im 4/35]
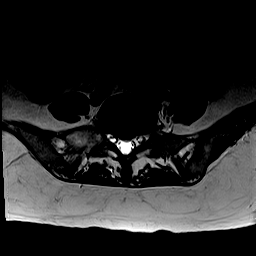
[im 12/35]
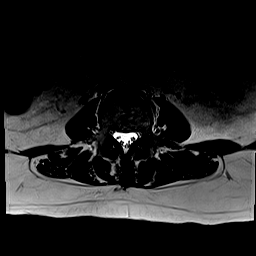
[im 16/35]
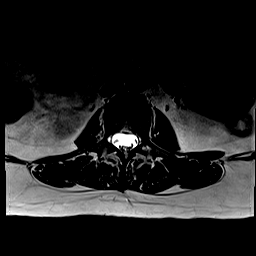
[im 19/35]
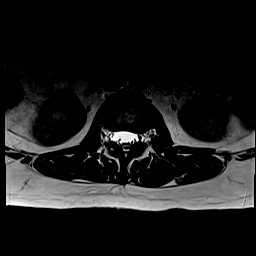
[im 23/35]
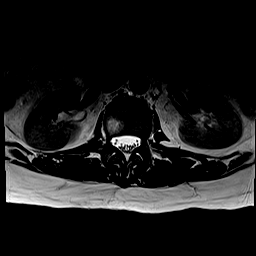
[im 31/35]
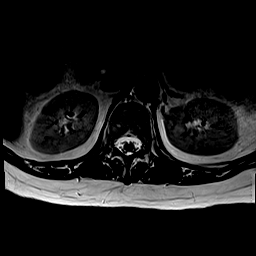
[im 35/35]
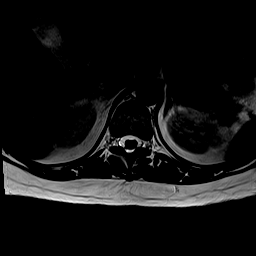

[Series 5: T1 · axial · 4.0mm · 0.39mm/px · z∈[-486,-289]mm · 8 of 35 slices shown (2 of 2)]
[im 1/35]
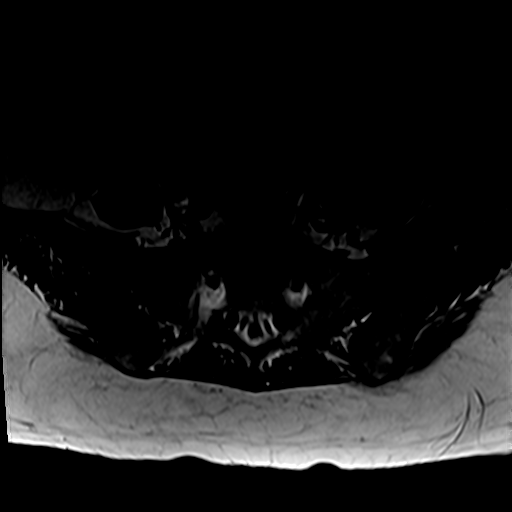
[im 4/35]
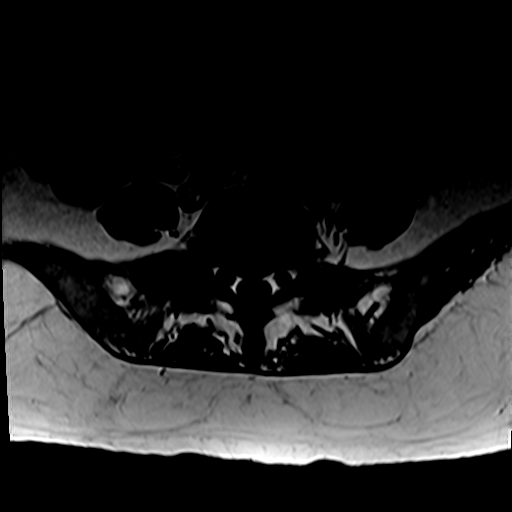
[im 12/35]
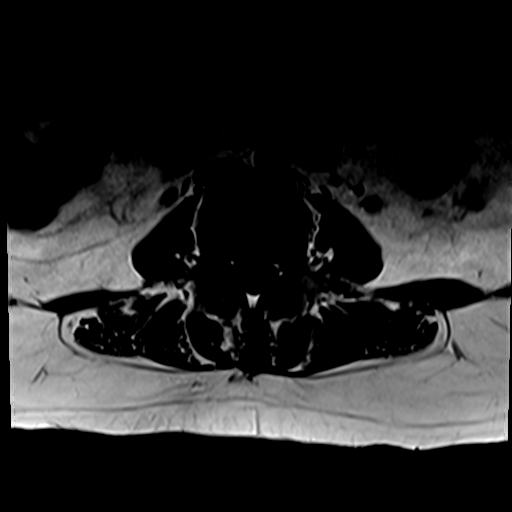
[im 16/35]
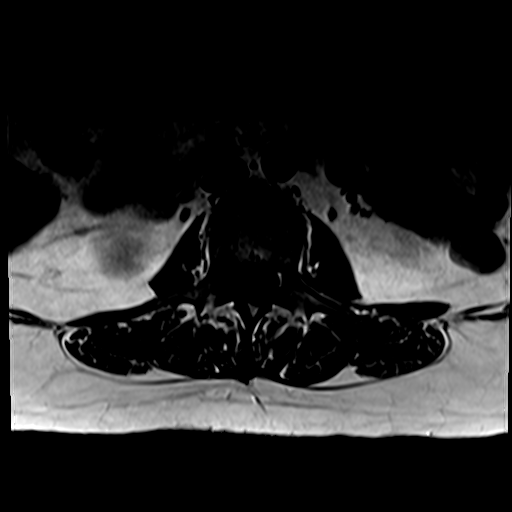
[im 19/35]
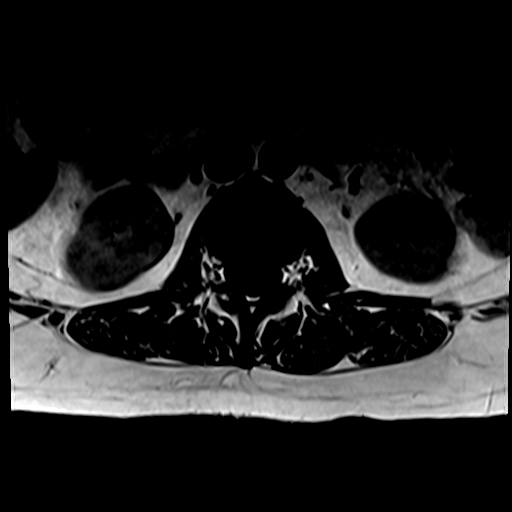
[im 23/35]
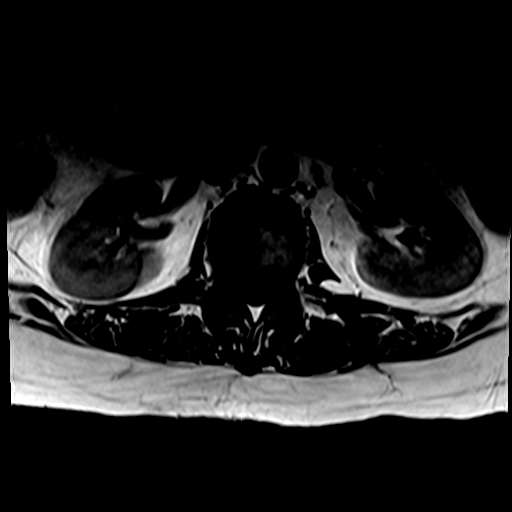
[im 31/35]
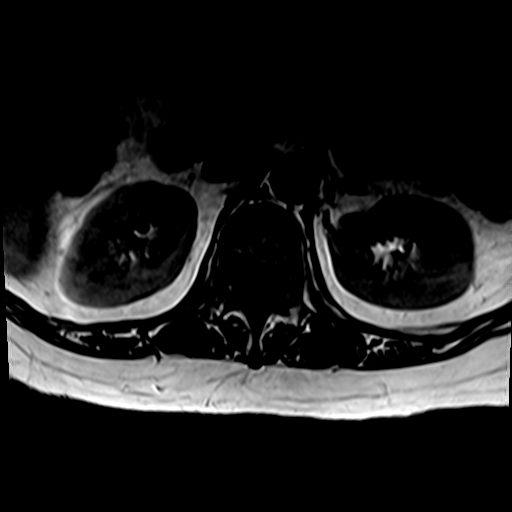
[im 35/35]
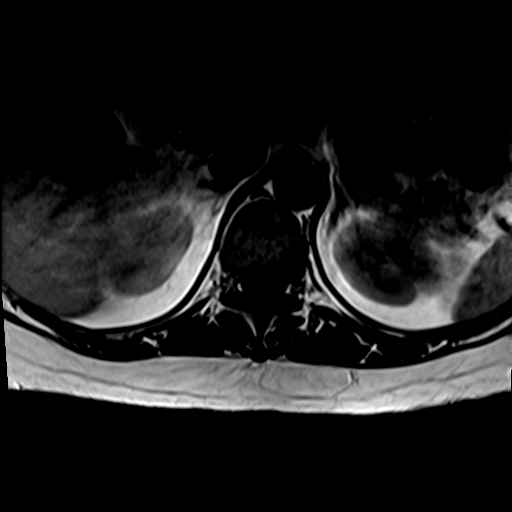

[Series 6: T1 fat-sat post-contrast · sagittal · 4.0mm · 0.81mm/px · 4 of 17 slices shown]
[im 1/17]
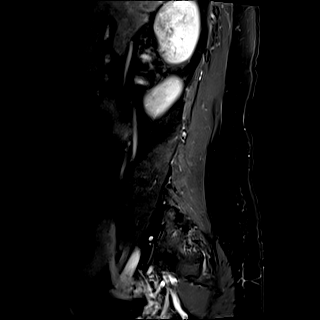
[im 5/17]
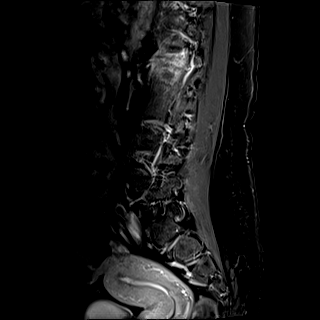
[im 9/17]
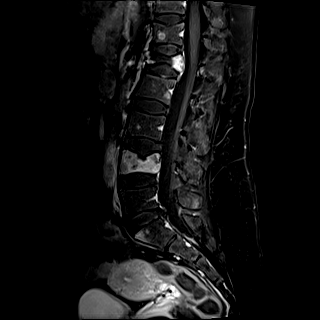
[im 13/17]
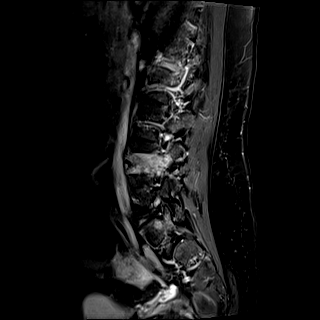

[28 of 48 positions shown; findings below may reference images not displayed]

FINDINGS: MRI CERVICAL SPINE FINDINGS

Alignment: No static listhesis. Straightening of the cervical
lordosis.

Vertebrae: Rounded enhancing 9 mm lesion within the mid C2 vertebral
body. Additional enhancing lesion centered within the left posterior
aspect of the C5 vertebral body measuring up to 10 mm. No enhancing
soft tissue mass. No evidence of epidural involvement or extension.

Cord: Normal signal and morphology. No cord expansion or focal cord
lesion.

Posterior Fossa, vertebral arteries, paraspinal tissues: Enhancing
marrow replacing lesion involving the left occipital condyle
measuring 1.8 cm (series 6, image 14). No abnormality within the
visualized posterior fossa. Paraspinal soft tissues are within
normal limits.

Disc levels: No significant disc protrusion, foraminal stenosis, or
canal stenosis at any level. Cervical intervertebral discs remain
well hydrated with preservation of disc height. Normal cervical
facet joints.

MRI THORACIC SPINE FINDINGS

Alignment: No static listhesis. 4 mm of bony retropulsion related to
pathologic fracture of the T6 vertebrae.

Vertebrae: Rounded 7 mm enhancing lesion within the anterior aspect
of the T4 vertebral body. Small 2-3 mm lesion within the T5
vertebral body. Diffuse metastatic involvement of the T6 level with
acute pathologic fracture where there is approximately 50% vertebral
body height loss and 4 mm of bony retropulsion. Small 2-3 mm
enhancing lesion near the right T8 pedicle. 9 mm enhancing lesion
near the left T9 pedicle. 7 mm enhancing metastatic lesion in the
anterior aspect of the T10 vertebral body. Multiple enhancing foci
within the T12 vertebral body where there is a chronic appearing
mild superior endplate compression fracture without residual marrow
edema. No enhancing soft tissue mass. No evidence of epidural
involvement or extension.

There is also a enhancing expansile rib lesion involving the
posterior right third rib (series 25, image 7. Rib lesion measures
approximately 2.9 x 1.3 cm. Suspect metastatic lesion of the
manubrium (series 26, image 12).

Cord: Normal signal and morphology. No cord expansion or focal cord
lesion.

Paraspinal and other soft tissues: No acute findings.

Disc levels:

Thoracic vertebral body heights are maintained with well-hydrated
discs. There is no degenerative disc or facet arthropathy of the
lumbar spine. No disc protrusion. Bony retropulsion at T6 contacts
the ventral cord without canal stenosis.

MRI LUMBAR SPINE FINDINGS

Segmentation:  Standard.

Alignment: No static listhesis. 4 mm of bony retropulsion related to
pathologic fracture of the L1 vertebral body.

Vertebrae: Diffuse metastatic lesion throughout the L1 vertebral
body with acute pathologic compression fracture with approximately
75% vertebral body height loss centrally and 4 mm of bony
retropulsion. Enhancing 9 mm lesion at the superior endplate and
right pedicle of L2. A few tiny enhancing foci are evident within
the L3 and L5 vertebral bodies. Large enhancing lesion throughout
the L4 vertebral body with acute pathologic fracture and
approximately 60% vertebral body height loss centrally.

Additional areas of enhancing metastatic disease are seen throughout
the sacrum, most predominantly involving the lateral right sacral
ala (series 6, image 3) rounded enhancing metastatic lesion within
the right ilium adjacent to the right SI joint (series 7, image 35).

Conus medullaris and cauda equina: Conus extends to the L1 level.
Conus and cauda equina appear unremarkable without abnormal
enhancement.

Paraspinal and other soft tissues: Negative.

Disc levels:

T12-L1: No significant disc protrusion, foraminal stenosis, or canal
stenosis.

Bony retropulsion of the L1 vertebral body results in mild canal
stenosis.

L1-L2: No significant disc protrusion, foraminal stenosis, or canal
stenosis.

L2-L3: No significant disc protrusion, foraminal stenosis, or canal
stenosis.

L3-L4: Minimal disc bulge without foraminal or canal stenosis.

L4-L5: Mild disc bulge, eccentric to the left without evidence of
foraminal or canal stenosis.

L5-S1: Diffuse disc bulge with a superimposed small central disc
protrusion contacting the ventral thecal sac without canal stenosis.
Bilateral foramina remain patent.
IMPRESSION: 1. Multiple enhancing metastatic lesions throughout the cervical,
thoracic, and lumbar spine. There is acute pathologic compression
fractures of T6, L1, and L4, as described above. Mild canal stenosis
related to retropulsion of the L1 vertebral body. There is no
high-grade foraminal or canal stenosis at any spinal level.
2. No evidence of extraosseous spinal soft tissue mass or epidural
involvement.
3. Expansile posterior right third rib metastatic lesion.
4. Additional metastatic lesions are seen within the left occipital
condyle, manubrium, sacrum, and posterior right ilium.

These results will be called to the ordering clinician or
representative by the Radiologist Assistant, and communication
documented in the PACS or [REDACTED].

## 2019-08-25 MED ORDER — GADOBUTROL 1 MMOL/ML IV SOLN
6.0000 mL | Freq: Once | INTRAVENOUS | Status: AC | PRN
  Administered 2019-08-25: 6 mL via INTRAVENOUS

## 2019-08-25 NOTE — Telephone Encounter (Signed)
Dr Janese Banks wanted me to call pt's friend on list to talk about that she has 3 fractures per MRI and dr ray has been speaking dr Doyle Askew about this and she is not a Psychologist, sport and exercise and will give dr Janese Banks and name and number to help in this matter with orthopredic surgeon. We did not get a call today so called the office of Doyle Askew and got rn voice mail and left message for a call back with any progress of an appt. For her . When reviewing care everywhere there is an appt for oncology and radiation MD at [facility].  I spoke to [place] about the results that we are trying to get ortho surgeon appt and that she has appts at [facility] and has been seen here. Just wanted to know if pt wants to stay at [facility] or would like to stay here. She would get chemo and radiation no matter where she goes and we are happy to be part of care but pt will need to tell us which place she wants to go. Izola Price thought that she would go to the place that got her the appt the quickest. She did not know about it at [facility] appt. She will talk to pt and let us know which way pt wants to go

## 2019-08-25 NOTE — Telephone Encounter (Signed)
I did get in touch with her emergency contact who speaks english and she was given all information and stated she would make sure the patient was given this information as well.

## 2019-08-25 NOTE — Telephone Encounter (Signed)
Sophia Cook, RN  Sophia [name], [place] ,  I have a young 39 year old pt with no insurance and new dx of breast cancer and probably mets to spine and she needs port by end of next week which is 6/11. She is hispanic and will need interpreter   Thanks  Sophia [name]        I have attempted through the interpreter services to contact this patient through her work, cell and home number to no avail. I am unable to leave a message for a return call due  to communicate difficulties. I have contacted Sophia [name] at the [facility] to let her know as well. Patient is scheduled with Dr. Lucky Cowboy for a port placement on 08/28/19 with a 9:00 am arrival time to the [facility]. Covid testing on 08/26/19 between 8-1 pm at the [place]. Pre-procedure instructions will be mailed to the patient.

## 2019-08-25 NOTE — Telephone Encounter (Signed)
Called report  IMPRESSION: 1. 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 There is acute pathologic compression fractures of T6, L1, and L4, as described above. Mild canal stenosis related to retropulsion of the L1 vertebral body. There is no high-grade foraminal or canal stenosis at any spinal level. 2. No evidence of extraosseous spinal soft tissue mass or epidural involvement. 3. Expansile posterior right third rib metastatic lesion. 4. Additional metastatic lesions are seen within the left occipital condyle, manubrium, sacrum, and posterior right ilium.  These results will be called to the ordering clinician or representative by the Radiologist Assistant, and communication documented in the PACS or [employer].   Electronically Signed   By: Davina Poke D.O.   On: 08/25/2019 15:25

## 2019-08-25 NOTE — Progress Notes (Signed)
Hematology/Oncology Consult note [facility] Telephone:([phone] [phone]  Patient Care Team: [facility], [place], NP as PCP - General Rico Junker, RN as Registered Nurse Theodore Demark, RN as Registered Nurse   Name of the patient: Sophia [name]  [id number]  08-Feb-1981    Reason for referral-new diagnosis of breast cancer Referring physician- bcep program   Date of visit: 08/25/19   History of presenting illness-patient is a 39 year old Hispanic female who is here with her friend.  History obtained with the help of an interpreter.Patient self palpated left breast mass which was followed by a diagnostic bilateral mammogram.  Mammogram showed 3.1 x 2.9 x 1.9 cm hypoechoic mass at the 1 o'clock position of the left breast.  For abnormal cortically thickened left axillary lymph nodes measuring up to 5 mm.  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  Patient was also having ongoing back pain and was seen by [facility] orthopedics Dr. Doyle Askew who ordered MRI lumbar spine without contrast which showed possible pathologic fractures of L1 and L4 vertebral bodies with greater than 50% height loss at L1 and abnormal signal involving L2-L3 S1 as well as right iliac bone concerning for metastatic disease.  Patient is a single mother of 3 adult children and is very anxious today.  She reports significant back pain which radiates to her bilateral thighs.  Denies any focal tingling numbness or weakness.  Denies any bowel bladder incontinence.  Pain has been uncontrolled despite taking Tylenol.  No prior history of abnormal breast biopsies.  No family history of breast cancer.  ECOG PS- 1  Pain scale- 3   Review of systems- Review of Systems  Constitutional: Negative for chills, fever, malaise/fatigue and weight loss.  HENT: Negative  for congestion, ear discharge and nosebleeds.   Eyes: Negative for blurred vision.  Respiratory: Negative for cough, hemoptysis, sputum production, shortness of breath and wheezing.   Cardiovascular: Negative for chest pain, palpitations, orthopnea and claudication.  Gastrointestinal: Negative for abdominal pain, blood in stool, constipation, diarrhea, heartburn, melena, nausea and vomiting.  Genitourinary: Negative for dysuria, flank pain, frequency, hematuria and urgency.  Musculoskeletal: Positive for back pain. Negative for joint pain and myalgias.  Skin: Negative for rash.  Neurological: Negative for dizziness, tingling, focal weakness, seizures, weakness and headaches.  Endo/Heme/Allergies: Does not bruise/bleed easily.  Psychiatric/Behavioral: Negative for depression and suicidal ideas. The patient is nervous/anxious. The patient does not have insomnia.     No Known Allergies  There are no problems to display for this patient.    Past Medical History:  Diagnosis Date  . Anxiety   . Vertigo      Past Surgical History:  Procedure Laterality Date  . BREAST BIOPSY Left 08/14/2019   [place] bx of mass, coil marker, path pending  . BREAST BIOPSY Left 08/14/2019   [place] bx of LN, hydromarker, path pending  . BREAST BIOPSY Left 08/14/2019   affirm bx of calcs, x marker, path pending    Social History   Socioeconomic History  . Marital status: Single    Spouse name: Not on file  . Number of children: Not on file  . Years of education: Not on file  . Highest education level: Not on file  Occupational History  . Not on file  Tobacco Use  . Smoking status: Never Smoker  .  Smokeless tobacco: Never Used  Substance and Sexual Activity  . Alcohol use: Yes    Comment: occassionaly  . Drug use: Not Currently  . Sexual activity: Not on file  Other Topics Concern  . Not on file  Social History Narrative  . Not on file   Social Determinants of Health   Financial Resource  Strain:   . Difficulty of Paying Living Expenses:   Food Insecurity:   . Worried About Charity fundraiser in the Last Year:   . Arboriculturist in the Last Year:   Transportation Needs:   . Film/video editor (Medical):   Marland Kitchen Lack of Transportation (Non-Medical):   Physical Activity:   . Days of Exercise per Week:   . Minutes of Exercise per Session:   Stress:   . Feeling of Stress :   Social Connections:   . Frequency of Communication with Friends and Family:   . Frequency of Social Gatherings with Friends and Family:   . Attends Religious Services:   . Active Member of Clubs or Organizations:   . Attends Archivist Meetings:   Marland Kitchen Marital Status:   Intimate Partner Violence:   . Fear of Current or Ex-Partner:   . Emotionally Abused:   Marland Kitchen Physically Abused:   . Sexually Abused:      Family History  Problem Relation Age of Onset  . Colon cancer Maternal Uncle      Current Outpatient Medications:  .  cyclobenzaprine (FLEXERIL) 10 MG tablet, Take 1 tablet (10 mg total) by mouth 3 (three) times daily as needed., Disp: 30 tablet, Rfl: 0 .  cyclobenzaprine (FLEXERIL) 10 MG tablet, Take by mouth., Disp: , Rfl:  .  naphazoline-pheniramine (NAPHCON-A) 0.025-0.3 % ophthalmic solution, 1 drop Four (4) times a day., Disp: , Rfl:  .  predniSONE (DELTASONE) 10 MG tablet, #48 tablets, take as directed, Disp: , Rfl:  .  dexamethasone (DECADRON) 4 MG tablet, Take 2 tablets (8 mg total) by mouth 2 (two) times daily with a meal., Disp: 40 tablet, Rfl: 0 .  LORazepam (ATIVAN) 0.5 MG tablet, Take 1 tablet (0.5 mg total) by mouth every 6 (six) hours as needed for anxiety., Disp: 30 tablet, Rfl: 0 .  meclizine (ANTIVERT) 25 MG tablet, Take 1 tablet (25 mg total) by mouth 3 (three) times daily as needed for dizziness. (Patient not taking: Reported on 08/22/2019), Disp: 30 tablet, Rfl: 0 .  meloxicam (MOBIC) 15 MG tablet, Take 1 tablet (15 mg total) by mouth daily. (Patient not taking:  Reported on 08/22/2019), Disp: 30 tablet, Rfl: 2 .  oxyCODONE (OXY IR/ROXICODONE) 5 MG immediate release tablet, Take 1 tablet (5 mg total) by mouth every 4 (four) hours as needed for severe pain., Disp: 84 tablet, Rfl: 0   Physical exam:  Vitals:   08/22/19 1422  BP: (!) 112/54  Pulse: (!) 138  Resp: 18  Temp: 98.2 F (36.8 C)  TempSrc: Tympanic  SpO2: 99%  Weight: 139 lb 3.2 oz (63.1 kg)   Physical Exam Constitutional:      Comments: Appears in mild distress from back pain.  Appears anxious  Cardiovascular:     Rate and Rhythm: Regular rhythm. Tachycardia present.     Heart sounds: Normal heart sounds.  Pulmonary:     Effort: Pulmonary effort is normal.     Breath sounds: Normal breath sounds.  Abdominal:     General: Bowel sounds are normal.     Palpations: Abdomen  is soft.  Skin:    General: Skin is warm and dry.  Neurological:     Mental Status: She is alert and oriented to person, place, and time.   Breast exam: Palpable breast mass about 3 cm in size in the left breast at the 1 o'clock position.  No palpable bilateral axillary adenopathy.  No other palpable breast masses.    CMP Latest Ref Rng & Units 08/22/2019  Glucose 70 - 99 mg/dL 111(H)  BUN 6 - 20 mg/dL 13  Creatinine 0.44 - 1.00 mg/dL 0.78  Sodium 135 - 145 mmol/L 139  Potassium 3.5 - 5.1 mmol/L 4.1  Chloride 98 - 111 mmol/L 103  CO2 22 - 32 mmol/L 27  Calcium 8.9 - 10.3 mg/dL 8.9  Total Protein 6.5 - 8.1 g/dL 7.6  Total Bilirubin 0.3 - 1.2 mg/dL 0.6  Alkaline Phos 38 - 126 U/L 96  AST 15 - 41 U/L 28  ALT 0 - 44 U/L 32   CBC Latest Ref Rng & Units 08/22/2019  WBC 4.0 - 10.5 K/uL 8.8  Hemoglobin 12.0 - 15.0 g/dL 13.3  Hematocrit 36.0 - 46.0 % 39.5  Platelets 150 - 400 K/uL 305    No images are attached to the encounter.  MR Cervical Spine W Wo Contrast  Result Date: 08/25/2019 CLINICAL DATA:  Back pain with bilateral lower extremity radiculopathy. Breast cancer. Reported history of abnormal lumbar  spine MRI EXAM: MRI CERVICAL, THORACIC AND LUMBAR SPINE WITHOUT AND WITH CONTRAST TECHNIQUE: Multiplanar and multiecho pulse sequences of the cervical spine, to include the craniocervical junction and cervicothoracic junction, and thoracic and lumbar spine, were obtained without and with intravenous contrast. CONTRAST:  6 mL Gadavist IV contrast COMPARISON:  No comparison studies are available at the time of dictation FINDINGS: MRI CERVICAL SPINE FINDINGS Alignment: No static listhesis. Straightening of the cervical lordosis. Vertebrae: Rounded enhancing 9 mm lesion within the mid C2 vertebral body. Additional enhancing lesion centered within the left posterior aspect of the C5 vertebral body measuring up to 10 mm. No enhancing soft tissue mass. No evidence of epidural involvement or extension. Cord: Normal signal and morphology. No cord expansion or focal cord lesion. Posterior Fossa, vertebral arteries, paraspinal tissues: Enhancing marrow replacing lesion involving the left occipital condyle measuring 1.8 cm (series 6, image 14). No abnormality within the visualized posterior fossa. Paraspinal soft tissues are within normal limits. Disc levels: No significant disc protrusion, foraminal stenosis, or canal stenosis at any level. Cervical intervertebral discs remain well hydrated with preservation of disc height. Normal cervical facet joints. MRI THORACIC SPINE FINDINGS Alignment: No static listhesis. 4 mm of bony retropulsion related to pathologic fracture of the T6 vertebrae. Vertebrae: Rounded 7 mm enhancing lesion within the anterior aspect of the T4 vertebral body. Small 2-3 mm lesion within the T5 vertebral body. Diffuse metastatic involvement of the T6 level with acute pathologic fracture where there is approximately 50% vertebral body height loss and 4 mm of bony retropulsion. Small 2-3 mm enhancing lesion near the right T8 pedicle. 9 mm enhancing lesion near the left T9 pedicle. 7 mm enhancing metastatic  lesion in the anterior aspect of the T10 vertebral body. Multiple enhancing foci within the T12 vertebral body where there is a chronic appearing mild superior endplate compression fracture without residual marrow edema. No enhancing soft tissue mass. No evidence of epidural involvement or extension. There is also a enhancing expansile rib lesion involving the posterior right third rib (series 25, image 7. Rib lesion  measures approximately 2.9 x 1.3 cm. Suspect metastatic lesion of the manubrium (series 26, image 12). Cord: Normal signal and morphology. No cord expansion or focal cord lesion. Paraspinal and other soft tissues: No acute findings. Disc levels: Thoracic vertebral body heights are maintained with well-hydrated discs. There is no degenerative disc or facet arthropathy of the lumbar spine. No disc protrusion. Bony retropulsion at T6 contacts the ventral cord without canal stenosis. MRI LUMBAR SPINE FINDINGS Segmentation:  Standard. Alignment: No static listhesis. 4 mm of bony retropulsion related to pathologic fracture of the L1 vertebral body. Vertebrae: Diffuse metastatic lesion throughout the L1 vertebral body with acute pathologic compression fracture with approximately 75% vertebral body height loss centrally and 4 mm of bony retropulsion. Enhancing 9 mm lesion at the superior endplate and right pedicle of L2. A few tiny enhancing foci are evident within the L3 and L5 vertebral bodies. Large enhancing lesion throughout the L4 vertebral body with acute pathologic fracture and approximately 60% vertebral body height loss centrally. Additional areas of enhancing metastatic disease are seen throughout the sacrum, most predominantly involving the lateral right sacral ala (series 6, image 3) rounded enhancing metastatic lesion within the right ilium adjacent to the right SI joint (series 7, image 35). Conus medullaris and cauda equina: Conus extends to the L1 level. Conus and cauda equina appear  unremarkable without abnormal enhancement. Paraspinal and other soft tissues: Negative. Disc levels: T12-L1: No significant disc protrusion, foraminal stenosis, or canal stenosis. Bony retropulsion of the L1 vertebral body results in mild canal stenosis. L1-L2: No significant disc protrusion, foraminal stenosis, or canal stenosis. L2-L3: No significant disc protrusion, foraminal stenosis, or canal stenosis. L3-L4: Minimal disc bulge without foraminal or canal stenosis. L4-L5: Mild disc bulge, eccentric to the left without evidence of foraminal or canal stenosis. L5-S1: Diffuse disc bulge with a superimposed small central disc protrusion contacting the ventral thecal sac without canal stenosis. Bilateral foramina remain patent. IMPRESSION: 1. 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 There is acute pathologic compression fractures of T6, L1, and L4, as described above. Mild canal stenosis related to retropulsion of the L1 vertebral body. There is no high-grade foraminal or canal stenosis at any spinal level. 2. No evidence of extraosseous spinal soft tissue mass or epidural involvement. 3. Expansile posterior right third rib metastatic lesion. 4. Additional metastatic lesions are seen within the left occipital condyle, manubrium, sacrum, and posterior right ilium. These results will be called to the ordering clinician or representative by the Radiologist Assistant, and communication documented in the PACS or [employer]. Electronically Signed   By: Davina Poke D.O.   On: 08/25/2019 15:25   MR Thoracic Spine W Wo Contrast  Result Date: 08/25/2019 CLINICAL DATA:  Back pain with bilateral lower extremity radiculopathy. Breast cancer. Reported history of abnormal lumbar spine MRI EXAM: MRI CERVICAL, THORACIC AND LUMBAR SPINE WITHOUT AND WITH CONTRAST TECHNIQUE: Multiplanar and multiecho pulse sequences of the cervical spine, to include the craniocervical  junction and cervicothoracic junction, and thoracic and lumbar spine, were obtained without and with intravenous contrast. CONTRAST:  6 mL Gadavist IV contrast COMPARISON:  No comparison studies are available at the time of dictation FINDINGS: MRI CERVICAL SPINE FINDINGS Alignment: No static listhesis. Straightening of the cervical lordosis. Vertebrae: Rounded enhancing 9 mm lesion within the mid C2 vertebral body. Additional enhancing lesion centered within the left posterior aspect of the C5 vertebral body measuring up to 10 mm. No enhancing soft tissue mass. No  evidence of epidural involvement or extension. Cord: Normal signal and morphology. No cord expansion or focal cord lesion. Posterior Fossa, vertebral arteries, paraspinal tissues: Enhancing marrow replacing lesion involving the left occipital condyle measuring 1.8 cm (series 6, image 14). No abnormality within the visualized posterior fossa. Paraspinal soft tissues are within normal limits. Disc levels: No significant disc protrusion, foraminal stenosis, or canal stenosis at any level. Cervical intervertebral discs remain well hydrated with preservation of disc height. Normal cervical facet joints. MRI THORACIC SPINE FINDINGS Alignment: No static listhesis. 4 mm of bony retropulsion related to pathologic fracture of the T6 vertebrae. Vertebrae: Rounded 7 mm enhancing lesion within the anterior aspect of the T4 vertebral body. Small 2-3 mm lesion within the T5 vertebral body. Diffuse metastatic involvement of the T6 level with acute pathologic fracture where there is approximately 50% vertebral body height loss and 4 mm of bony retropulsion. Small 2-3 mm enhancing lesion near the right T8 pedicle. 9 mm enhancing lesion near the left T9 pedicle. 7 mm enhancing metastatic lesion in the anterior aspect of the T10 vertebral body. Multiple enhancing foci within the T12 vertebral body where there is a chronic appearing mild superior endplate compression fracture  without residual marrow edema. No enhancing soft tissue mass. No evidence of epidural involvement or extension. There is also a enhancing expansile rib lesion involving the posterior right third rib (series 25, image 7. Rib lesion measures approximately 2.9 x 1.3 cm. Suspect metastatic lesion of the manubrium (series 26, image 12). Cord: Normal signal and morphology. No cord expansion or focal cord lesion. Paraspinal and other soft tissues: No acute findings. Disc levels: Thoracic vertebral body heights are maintained with well-hydrated discs. There is no degenerative disc or facet arthropathy of the lumbar spine. No disc protrusion. Bony retropulsion at T6 contacts the ventral cord without canal stenosis. MRI LUMBAR SPINE FINDINGS Segmentation:  Standard. Alignment: No static listhesis. 4 mm of bony retropulsion related to pathologic fracture of the L1 vertebral body. Vertebrae: Diffuse metastatic lesion throughout the L1 vertebral body with acute pathologic compression fracture with approximately 75% vertebral body height loss centrally and 4 mm of bony retropulsion. Enhancing 9 mm lesion at the superior endplate and right pedicle of L2. A few tiny enhancing foci are evident within the L3 and L5 vertebral bodies. Large enhancing lesion throughout the L4 vertebral body with acute pathologic fracture and approximately 60% vertebral body height loss centrally. Additional areas of enhancing metastatic disease are seen throughout the sacrum, most predominantly involving the lateral right sacral ala (series 6, image 3) rounded enhancing metastatic lesion within the right ilium adjacent to the right SI joint (series 7, image 35). Conus medullaris and cauda equina: Conus extends to the L1 level. Conus and cauda equina appear unremarkable without abnormal enhancement. Paraspinal and other soft tissues: Negative. Disc levels: T12-L1: No significant disc protrusion, foraminal stenosis, or canal stenosis. Bony retropulsion of  the L1 vertebral body results in mild canal stenosis. L1-L2: No significant disc protrusion, foraminal stenosis, or canal stenosis. L2-L3: No significant disc protrusion, foraminal stenosis, or canal stenosis. L3-L4: Minimal disc bulge without foraminal or canal stenosis. L4-L5: Mild disc bulge, eccentric to the left without evidence of foraminal or canal stenosis. L5-S1: Diffuse disc bulge with a superimposed small central disc protrusion contacting the ventral thecal sac without canal stenosis. Bilateral foramina remain patent. IMPRESSION: 1. 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 There is acute pathologic compression fractures of T6, L1, and L4, as  described above. Mild canal stenosis related to retropulsion of the L1 vertebral body. There is no high-grade foraminal or canal stenosis at any spinal level. 2. No evidence of extraosseous spinal soft tissue mass or epidural involvement. 3. Expansile posterior right third rib metastatic lesion. 4. Additional metastatic lesions are seen within the left occipital condyle, manubrium, sacrum, and posterior right ilium. These results will be called to the ordering clinician or representative by the Radiologist Assistant, and communication documented in the PACS or [employer]. Electronically Signed   By: Davina Poke D.O.   On: 08/25/2019 15:25   MR Lumbar Spine W Wo Contrast  Result Date: 08/25/2019 CLINICAL DATA:  Back pain with bilateral lower extremity radiculopathy. Breast cancer. Reported history of abnormal lumbar spine MRI EXAM: MRI CERVICAL, THORACIC AND LUMBAR SPINE WITHOUT AND WITH CONTRAST TECHNIQUE: Multiplanar and multiecho pulse sequences of the cervical spine, to include the craniocervical junction and cervicothoracic junction, and thoracic and lumbar spine, were obtained without and with intravenous contrast. CONTRAST:  6 mL Gadavist IV contrast COMPARISON:  No comparison studies are available  at the time of dictation FINDINGS: MRI CERVICAL SPINE FINDINGS Alignment: No static listhesis. Straightening of the cervical lordosis. Vertebrae: Rounded enhancing 9 mm lesion within the mid C2 vertebral body. Additional enhancing lesion centered within the left posterior aspect of the C5 vertebral body measuring up to 10 mm. No enhancing soft tissue mass. No evidence of epidural involvement or extension. Cord: Normal signal and morphology. No cord expansion or focal cord lesion. Posterior Fossa, vertebral arteries, paraspinal tissues: Enhancing marrow replacing lesion involving the left occipital condyle measuring 1.8 cm (series 6, image 14). No abnormality within the visualized posterior fossa. Paraspinal soft tissues are within normal limits. Disc levels: No significant disc protrusion, foraminal stenosis, or canal stenosis at any level. Cervical intervertebral discs remain well hydrated with preservation of disc height. Normal cervical facet joints. MRI THORACIC SPINE FINDINGS Alignment: No static listhesis. 4 mm of bony retropulsion related to pathologic fracture of the T6 vertebrae. Vertebrae: Rounded 7 mm enhancing lesion within the anterior aspect of the T4 vertebral body. Small 2-3 mm lesion within the T5 vertebral body. Diffuse metastatic involvement of the T6 level with acute pathologic fracture where there is approximately 50% vertebral body height loss and 4 mm of bony retropulsion. Small 2-3 mm enhancing lesion near the right T8 pedicle. 9 mm enhancing lesion near the left T9 pedicle. 7 mm enhancing metastatic lesion in the anterior aspect of the T10 vertebral body. Multiple enhancing foci within the T12 vertebral body where there is a chronic appearing mild superior endplate compression fracture without residual marrow edema. No enhancing soft tissue mass. No evidence of epidural involvement or extension. There is also a enhancing expansile rib lesion involving the posterior right third rib (series  25, image 7. Rib lesion measures approximately 2.9 x 1.3 cm. Suspect metastatic lesion of the manubrium (series 26, image 12). Cord: Normal signal and morphology. No cord expansion or focal cord lesion. Paraspinal and other soft tissues: No acute findings. Disc levels: Thoracic vertebral body heights are maintained with well-hydrated discs. There is no degenerative disc or facet arthropathy of the lumbar spine. No disc protrusion. Bony retropulsion at T6 contacts the ventral cord without canal stenosis. MRI LUMBAR SPINE FINDINGS Segmentation:  Standard. Alignment: No static listhesis. 4 mm of bony retropulsion related to pathologic fracture of the L1 vertebral body. Vertebrae: Diffuse metastatic lesion throughout the L1 vertebral body with acute pathologic compression fracture with approximately  75% vertebral body height loss centrally and 4 mm of bony retropulsion. Enhancing 9 mm lesion at the superior endplate and right pedicle of L2. A few tiny enhancing foci are evident within the L3 and L5 vertebral bodies. Large enhancing lesion throughout the L4 vertebral body with acute pathologic fracture and approximately 60% vertebral body height loss centrally. Additional areas of enhancing metastatic disease are seen throughout the sacrum, most predominantly involving the lateral right sacral ala (series 6, image 3) rounded enhancing metastatic lesion within the right ilium adjacent to the right SI joint (series 7, image 35). Conus medullaris and cauda equina: Conus extends to the L1 level. Conus and cauda equina appear unremarkable without abnormal enhancement. Paraspinal and other soft tissues: Negative. Disc levels: T12-L1: No significant disc protrusion, foraminal stenosis, or canal stenosis. Bony retropulsion of the L1 vertebral body results in mild canal stenosis. L1-L2: No significant disc protrusion, foraminal stenosis, or canal stenosis. L2-L3: No significant disc protrusion, foraminal stenosis, or canal  stenosis. L3-L4: Minimal disc bulge without foraminal or canal stenosis. L4-L5: Mild disc bulge, eccentric to the left without evidence of foraminal or canal stenosis. L5-S1: Diffuse disc bulge with a superimposed small central disc protrusion contacting the ventral thecal sac without canal stenosis. Bilateral foramina remain patent. IMPRESSION: 1. 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 There is acute pathologic compression fractures of T6, L1, and L4, as described above. Mild canal stenosis related to retropulsion of the L1 vertebral body. There is no high-grade foraminal or canal stenosis at any spinal level. 2. No evidence of extraosseous spinal soft tissue mass or epidural involvement. 3. Expansile posterior right third rib metastatic lesion. 4. Additional metastatic lesions are seen within the left occipital condyle, manubrium, sacrum, and posterior right ilium. These results will be called to the ordering clinician or representative by the Radiologist Assistant, and communication documented in the PACS or [employer]. Electronically Signed   By: Davina Poke D.O.   On: 08/25/2019 15:25   US BREAST LTD UNI LEFT INC AXILLA  Result Date: 08/07/2019 CLINICAL DATA:  Patient presents for evaluation of palpable abnormality within the left breast. EXAM: DIGITAL DIAGNOSTIC BILATERAL MAMMOGRAM WITH CAD AND TOMO ULTRASOUND BILATERAL BREAST COMPARISON:  Previous exam(s). ACR Breast Density Category c: The breast tissue is heterogeneously dense, which may obscure small masses. FINDINGS: Underlying the palpable marker within the upper outer left breast is a focal area of masslike distortion. There are a few coarse calcifications associated with this mass. Within the lateral left breast anterior to middle depth there is a 5 mm group of coarse heterogeneous calcifications. Questioned asymmetry within the superior right breast resolved with additional imaging.  Mammographic images were processed with CAD. On physical exam, there is a firm mass within the upper-outer left breast. Targeted ultrasound is performed, showing a 3.1 x 1.9 x 2.9 cm lobular hypoechoic mass left breast 1 o'clock position 4 cm from nipple corresponding with mass on mammogram. At least 4 abnormal cortically thickened left axillary lymph nodes are demonstrated measuring up to 5 mm. Dense tissue is demonstrated within the superior right breast. No suspicious mass is identified. IMPRESSION: 1. Suspicious palpable left breast mass 1 o'clock position. 2. Multiple (at least 4) cortically thickened left axillary lymph nodes. 3. Suspicious calcifications outer left breast middle depth. RECOMMENDATION: 1. Ultrasound-guided core needle biopsy left breast mass 1 o'clock position. 2. Ultrasound-guided core needle biopsy of 1 of the cortically thickened left axillary lymph nodes. 3. Stereotactic guided core  needle biopsy of the coarse heterogeneous calcifications within the outer left breast. I have discussed the findings and recommendations with the patient. If applicable, a reminder letter will be sent to the patient regarding the next appointment. BI-RADS CATEGORY  5: Highly suggestive of malignancy. Electronically Signed   By: Lovey Newcomer M.D.   On: 08/07/2019 12:06   US BREAST LTD UNI RIGHT INC AXILLA  Result Date: 08/07/2019 CLINICAL DATA:  Patient presents for evaluation of palpable abnormality within the left breast. EXAM: DIGITAL DIAGNOSTIC BILATERAL MAMMOGRAM WITH CAD AND TOMO ULTRASOUND BILATERAL BREAST COMPARISON:  Previous exam(s). ACR Breast Density Category c: The breast tissue is heterogeneously dense, which may obscure small masses. FINDINGS: Underlying the palpable marker within the upper outer left breast is a focal area of masslike distortion. There are a few coarse calcifications associated with this mass. Within the lateral left breast anterior to middle depth there is a 5 mm group of  coarse heterogeneous calcifications. Questioned asymmetry within the superior right breast resolved with additional imaging. Mammographic images were processed with CAD. On physical exam, there is a firm mass within the upper-outer left breast. Targeted ultrasound is performed, showing a 3.1 x 1.9 x 2.9 cm lobular hypoechoic mass left breast 1 o'clock position 4 cm from nipple corresponding with mass on mammogram. At least 4 abnormal cortically thickened left axillary lymph nodes are demonstrated measuring up to 5 mm. Dense tissue is demonstrated within the superior right breast. No suspicious mass is identified. IMPRESSION: 1. Suspicious palpable left breast mass 1 o'clock position. 2. Multiple (at least 4) cortically thickened left axillary lymph nodes. 3. Suspicious calcifications outer left breast middle depth. RECOMMENDATION: 1. Ultrasound-guided core needle biopsy left breast mass 1 o'clock position. 2. Ultrasound-guided core needle biopsy of 1 of the cortically thickened left axillary lymph nodes. 3. Stereotactic guided core needle biopsy of the coarse heterogeneous calcifications within the outer left breast. I have discussed the findings and recommendations with the patient. If applicable, a reminder letter will be sent to the patient regarding the next appointment. BI-RADS CATEGORY  5: Highly suggestive of malignancy. Electronically Signed   By: Lovey Newcomer M.D.   On: 08/07/2019 12:06   MS DIGITAL DIAG TOMO BILAT  Result Date: 08/07/2019 CLINICAL DATA:  Patient presents for evaluation of palpable abnormality within the left breast. EXAM: DIGITAL DIAGNOSTIC BILATERAL MAMMOGRAM WITH CAD AND TOMO ULTRASOUND BILATERAL BREAST COMPARISON:  Previous exam(s). ACR Breast Density Category c: The breast tissue is heterogeneously dense, which may obscure small masses. FINDINGS: Underlying the palpable marker within the upper outer left breast is a focal area of masslike distortion. There are a few coarse  calcifications associated with this mass. Within the lateral left breast anterior to middle depth there is a 5 mm group of coarse heterogeneous calcifications. Questioned asymmetry within the superior right breast resolved with additional imaging. Mammographic images were processed with CAD. On physical exam, there is a firm mass within the upper-outer left breast. Targeted ultrasound is performed, showing a 3.1 x 1.9 x 2.9 cm lobular hypoechoic mass left breast 1 o'clock position 4 cm from nipple corresponding with mass on mammogram. At least 4 abnormal cortically thickened left axillary lymph nodes are demonstrated measuring up to 5 mm. Dense tissue is demonstrated within the superior right breast. No suspicious mass is identified. IMPRESSION: 1. Suspicious palpable left breast mass 1 o'clock position. 2. Multiple (at least 4) cortically thickened left axillary lymph nodes. 3. Suspicious calcifications outer left breast middle depth. RECOMMENDATION:  1. Ultrasound-guided core needle biopsy left breast mass 1 o'clock position. 2. Ultrasound-guided core needle biopsy of 1 of the cortically thickened left axillary lymph nodes. 3. Stereotactic guided core needle biopsy of the coarse heterogeneous calcifications within the outer left breast. I have discussed the findings and recommendations with the patient. If applicable, a reminder letter will be sent to the patient regarding the next appointment. BI-RADS CATEGORY  5: Highly suggestive of malignancy. Electronically Signed   By: Lovey Newcomer M.D.   On: 08/07/2019 12:06   MS DIGITAL DIAG UNI LEFT  Addendum Date: 08/20/2019   ADDENDUM REPORT: 08/20/2019 09:06 ADDENDUM: PATHOLOGY revealed: A. BREAST, LEFT AT 1:00; ULTRASOUND-GUIDED CORE NEEDLE BIOPSY: - INVASIVE MAMMARY CARCINOMA, NO SPECIAL TYPE. At least 9 mm in this sample. Grade 2. Ductal carcinoma in situ: Not identified. Lymphovascular invasion: Not identified. B. AXILLA, LEFT; ULTRASOUND-GUIDED CORE NEEDLE  BIOPSY: - INVASIVE MAMMARY CARCINOMA, NO SPECIAL TYPE. Comment: Invasive carcinoma within this specimen measures approximately 1 mm in greatest extent, and is histologically similar to tumor identified in part A. No definitive lymph node tissue is identified. C. BREAST, LEFT UPPER OUTER QUADRANT; STEREOTACTIC CORE NEEDLE 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 CARCINOMA IN SITU, HIGH GRADE WITH COMEDONECROSIS, WITH ASSOCIATED CALCIFICATIONS. Comment: The specimen consists predominantly of ductal carcinoma in situ, present in 7 of 8 tissue blocks, and measuring up to 2 mm in greatest linear extent. DCIS is associated with calcifications. Invasive carcinoma is identified within a single block (C7), measuring 1 mm in greatest extent, and is histologically similar to tumor seen in parts A and B. Hormone receptor testing will be limited to specimen A. Pathology results are CONCORDANT with imaging findings, per Dr. Audie Pinto. Breast MRI is recommended for extent of disease. Multiple attempts to contact patient, using [employer], were unsuccessful. Notified Tanya Nones RN at [facility] (who is patient's Furniture conservator/restorer) and she will contact patient with biopsy results and arrange surgical referral. Addendum by Electa Sniff RN on 08/20/2019. Electronically Signed   By: Audie Pinto M.D.   On: 08/20/2019 09:06   Result Date: 08/20/2019 CLINICAL DATA:  39 year old female presenting for biopsy of a left breast mass, left breast calcifications and a left axillary lymph node. EXAM: DIAGNOSTIC LEFT MAMMOGRAM POST ULTRASOUND AND STEREOTACTIC BIOPSIES COMPARISON:  Previous exam(s). FINDINGS: Mammographic images were obtained following ultrasound guided biopsy of a left breast mass at 1 o'clock. The coil biopsy marking clip is in expected position at the site of biopsy. Mammographic images were obtained following ultrasound guided biopsy of a left axillary lymph  node. The [facility] biopsy marking clip is in expected position at the site of biopsy. Mammographic images were obtained following stereotactic guided biopsy of calcifications. The X biopsy marking clip is in expected position at the site of biopsy. IMPRESSION: Appropriate positioning of the coil shaped biopsy marking clip at the site of biopsy in the left breast 1 o'clock Appropriate positioning of the X shaped biopsy marking clip at the site of biopsy in the upper outer left breast. Appropriate positioning of the [facility] shaped biopsy marking clip at the site of biopsy in the left axilla. Final Assessment: Post Procedure Mammograms for Marker Placement Electronically Signed: By: Audie Pinto M.D. On: 08/14/2019 09:36   MM LT BREAST BX W LOC DEV 1ST LESION IMAGE BX SPEC STEREO GUIDE  Addendum Date: 08/20/2019   ADDENDUM REPORT: 08/20/2019 09:06 ADDENDUM: PATHOLOGY revealed: A. BREAST, LEFT AT 1:00; ULTRASOUND-GUIDED CORE NEEDLE  BIOPSY: - INVASIVE MAMMARY CARCINOMA, NO SPECIAL TYPE. At least 9 mm in this sample. Grade 2. Ductal carcinoma in situ: Not identified. Lymphovascular invasion: Not identified. B. AXILLA, LEFT; ULTRASOUND-GUIDED CORE NEEDLE BIOPSY: - INVASIVE MAMMARY CARCINOMA, NO SPECIAL TYPE. Comment: Invasive carcinoma within this specimen measures approximately 1 mm in greatest extent, and is histologically similar to tumor identified in part A. No definitive lymph node tissue is identified. C. BREAST, LEFT UPPER OUTER QUADRANT; STEREOTACTIC CORE NEEDLE 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 CARCINOMA IN SITU, HIGH GRADE WITH COMEDONECROSIS, WITH ASSOCIATED CALCIFICATIONS. Comment: The specimen consists predominantly of ductal carcinoma in situ, present in 7 of 8 tissue blocks, and measuring up to 2 mm in greatest linear extent. DCIS is associated with calcifications. Invasive carcinoma is identified within a single block (C7), measuring 1 mm in greatest extent, and is  histologically similar to tumor seen in parts A and B. Hormone receptor testing will be limited to specimen A. Pathology results are CONCORDANT with imaging findings, per Dr. Audie Pinto. Breast MRI is recommended for extent of disease. Multiple attempts to contact patient, using [employer], were unsuccessful. Notified Tanya Nones RN at [facility] (who is patient's Furniture conservator/restorer) and she will contact patient with biopsy results and arrange surgical referral. Addendum by Electa Sniff RN on 08/20/2019. Electronically Signed   By: Audie Pinto M.D.   On: 08/20/2019 09:06   Result Date: 08/20/2019 CLINICAL DATA:  39 year old female presenting for biopsy of a left breast mass, left axillary lymph node and left breast calcifications. EXAM: LEFT BREAST STEREOTACTIC CORE NEEDLE BIOPSY COMPARISON:  Previous exams. FINDINGS: The patient and I discussed the procedure of stereotactic-guided biopsy including benefits and alternatives. We discussed the high likelihood of a successful procedure. We discussed the risks of the procedure including infection, bleeding, tissue injury, clip migration, and inadequate sampling. Informed written consent was given. The usual time out protocol was performed immediately prior to the procedure. Using sterile technique and 1% Lidocaine as local anesthetic, under stereotactic guidance, a 9 gauge vacuum assisted device was used to perform core needle biopsy of calcifications in the upper outer left breast using a superior approach. Specimen radiograph was performed showing at least 4 specimens with calcifications. Specimens with calcifications are identified for pathology. Lesion quadrant: Upper outer quadrant At the conclusion of the procedure, an X tissue marker clip was deployed into the biopsy cavity. Follow-up 2-view mammogram was performed and dictated separately. IMPRESSION: Stereotactic-guided biopsy of calcifications in the upper outer left  breast. No apparent complications. Electronically Signed: By: Audie Pinto M.D. On: 08/14/2019 09:43   [place] LT BREAST BX W LOC DEV 1ST LESION IMG BX SPEC US GUIDE  Addendum Date: 08/20/2019   ADDENDUM REPORT: 08/20/2019 09:04 ADDENDUM: PATHOLOGY revealed: A. BREAST, LEFT AT 1:00; ULTRASOUND-GUIDED CORE NEEDLE BIOPSY: - INVASIVE MAMMARY CARCINOMA, NO SPECIAL TYPE. At least 9 mm in this sample. Grade 2. Ductal carcinoma in situ: Not identified. Lymphovascular invasion: Not identified. B. AXILLA, LEFT; ULTRASOUND-GUIDED CORE NEEDLE BIOPSY: - INVASIVE MAMMARY CARCINOMA, NO SPECIAL TYPE. Comment: Invasive carcinoma within this specimen measures approximately 1 mm in greatest extent, and is histologically similar to tumor identified in part A. No definitive lymph node tissue is identified. C. BREAST, LEFT UPPER OUTER QUADRANT; STEREOTACTIC CORE NEEDLE 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 CARCINOMA IN SITU, HIGH GRADE WITH COMEDONECROSIS, WITH ASSOCIATED CALCIFICATIONS. Comment: The specimen consists predominantly of ductal carcinoma in situ, present in 7  of 8 tissue blocks, and measuring up to 2 mm in greatest linear extent. DCIS is associated with calcifications. Invasive carcinoma is identified within a single block (C7), measuring 1 mm in greatest extent, and is histologically similar to tumor seen in parts A and B. Hormone receptor testing will be limited to specimen A. Pathology results are CONCORDANT with imaging findings, per Dr. Audie Pinto. Breast MRI is recommended for extent of disease. Multiple attempts to contact patient, using [employer], were unsuccessful. Notified Tanya Nones RN at [facility] (who is patient's Furniture conservator/restorer) and she will contact patient with biopsy results and arrange surgical referral. Addendum by Electa Sniff RN on 08/20/2019. Electronically Signed   By: Audie Pinto M.D.   On: 08/20/2019 09:04   Result Date:  08/20/2019 CLINICAL DATA:  39 year old female presenting for biopsy of a left breast mass, left axillary lymph node and left breast calcifications. EXAM: ULTRASOUND GUIDED LEFT BREAST CORE NEEDLE BIOPSY [place] AXILLARY NODE CORE BIOPSY LEFT COMPARISON:  Previous exam(s). PROCEDURE: I met with the patient and we discussed the procedure of ultrasound-guided biopsy, including benefits and alternatives. We discussed the high likelihood of a successful procedure. We discussed the risks of the procedure, including infection, bleeding, tissue injury, clip migration, and inadequate sampling. Informed written consent was given. The usual time-out protocol was performed immediately prior to the procedure. 1.  Lesion quadrant: Upper outer quadrant Using sterile technique and 1% Lidocaine as local anesthetic, under direct ultrasound visualization, a 12 gauge spring-loaded device was used to perform biopsy of a mass in the left breast at 1 o'clock using a lateral approach. At the conclusion of the procedure a coil tissue marker clip was deployed into the biopsy cavity. Follow up 2 view mammogram was performed and dictated separately. 2.  Axilla Using sterile technique and 1% Lidocaine as local anesthetic, under direct ultrasound visualization, a 14 gauge spring-loaded device was used to perform biopsy of a left axillary lymph node using a lateral approach. At the conclusion of the procedure a HydroMARK tissue marker clip was deployed into the biopsy cavity. Follow up 2 view mammogram was performed and dictated separately. IMPRESSION: Ultrasound guided biopsy of a left breast mass at 1 o'clock and a left axillary lymph node. No apparent complications. Electronically Signed: By: Audie Pinto M.D. On: 08/14/2019 09:42   [place] LT BREAST BX W LOC DEV EA ADD LESION IMG BX SPEC US GUIDE  Addendum Date: 08/20/2019   ADDENDUM REPORT: 08/20/2019 09:04 ADDENDUM: PATHOLOGY revealed: A. BREAST, LEFT AT 1:00; ULTRASOUND-GUIDED CORE NEEDLE  BIOPSY: - INVASIVE MAMMARY CARCINOMA, NO SPECIAL TYPE. At least 9 mm in this sample. Grade 2. Ductal carcinoma in situ: Not identified. Lymphovascular invasion: Not identified. B. AXILLA, LEFT; ULTRASOUND-GUIDED CORE NEEDLE BIOPSY: - INVASIVE MAMMARY CARCINOMA, NO SPECIAL TYPE. Comment: Invasive carcinoma within this specimen measures approximately 1 mm in greatest extent, and is histologically similar to tumor identified in part A. No definitive lymph node tissue is identified. C. BREAST, LEFT UPPER OUTER QUADRANT; STEREOTACTIC CORE NEEDLE 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 CARCINOMA IN SITU, HIGH GRADE WITH COMEDONECROSIS, WITH ASSOCIATED CALCIFICATIONS. Comment: The specimen consists predominantly of ductal carcinoma in situ, present in 7 of 8 tissue blocks, and measuring up to 2 mm in greatest linear extent. DCIS is associated with calcifications. Invasive carcinoma is identified within a single block (C7), measuring 1 mm in greatest extent, and is histologically similar to tumor seen in parts A and B. Hormone  receptor testing will be limited to specimen A. Pathology results are CONCORDANT with imaging findings, per Dr. Audie Pinto. Breast MRI is recommended for extent of disease. Multiple attempts to contact patient, using [employer], were unsuccessful. Notified Tanya Nones RN at [facility] (who is patient's Furniture conservator/restorer) and she will contact patient with biopsy results and arrange surgical referral. Addendum by Electa Sniff RN on 08/20/2019. Electronically Signed   By: Audie Pinto M.D.   On: 08/20/2019 09:04   Result Date: 08/20/2019 CLINICAL DATA:  39 year old female presenting for biopsy of a left breast mass, left axillary lymph node and left breast calcifications. EXAM: ULTRASOUND GUIDED LEFT BREAST CORE NEEDLE BIOPSY [place] AXILLARY NODE CORE BIOPSY LEFT COMPARISON:  Previous exam(s). PROCEDURE: I met with the patient and we discussed  the procedure of ultrasound-guided biopsy, including benefits and alternatives. We discussed the high likelihood of a successful procedure. We discussed the risks of the procedure, including infection, bleeding, tissue injury, clip migration, and inadequate sampling. Informed written consent was given. The usual time-out protocol was performed immediately prior to the procedure. 1.  Lesion quadrant: Upper outer quadrant Using sterile technique and 1% Lidocaine as local anesthetic, under direct ultrasound visualization, a 12 gauge spring-loaded device was used to perform biopsy of a mass in the left breast at 1 o'clock using a lateral approach. At the conclusion of the procedure a coil tissue marker clip was deployed into the biopsy cavity. Follow up 2 view mammogram was performed and dictated separately. 2.  Axilla Using sterile technique and 1% Lidocaine as local anesthetic, under direct ultrasound visualization, a 14 gauge spring-loaded device was used to perform biopsy of a left axillary lymph node using a lateral approach. At the conclusion of the procedure a HydroMARK tissue marker clip was deployed into the biopsy cavity. Follow up 2 view mammogram was performed and dictated separately. IMPRESSION: Ultrasound guided biopsy of a left breast mass at 1 o'clock and a left axillary lymph node. No apparent complications. Electronically Signed: By: Audie Pinto M.D. On: 08/14/2019 09:42   MM Outside Films Mammo  Result Date: 08/08/2019 This examination belongs to an outside facility and is stored here for comparison purposes only.  Contact the originating outside institution for any associated report or interpretation.   Assessment and plan- Patient is a 39 y.o. 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  I do not have  the MRI images to review today.  Discussed the results of pathology with the patient in detail as well.  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  At this time I would recommend MRI cervical thoracic and lumbar spine with and without contrast to take a complete look at her spine.  She will also need a surgical opinion to see if the compression fracture noted on MRI would be amenable for kyphoplasty given her pain.  I did speak to Dr. Doyle Askew from [facility] and she has agreed to get the patient an appointment with orthopedic oncology next week. If that doesn't work, I will get in touch with Dr. Cari Caraway since [place] [facility] sees patients in [place].  2.  PET CT scan and MRI brain to complete staging work-up 3.  Baseline labs today including CBC with differential, CMP, CA 15-3 and CA 27-29 4.  Neoplasm  related pain: I have started her on oxycodone 5 mg every 4 hours as needed.  I have also started her on Decadron 8 mg twice daily for 10 days 5.  Anxiety: I have started her on Ativan 0.5 mg every 6 as needed 6.  Given that she has ER negative HER-2 positive disease she will need HER-2 directed treatment depending on her staging.  If she has stage IV disease I would recommend weekly Taxol along with Herceptin and Perjeta every 3 weeks.  I will plan to get a port placed as well as echocardiogram in anticipation of start of chemotherapy 7.  She will need to be referred for genetic counseling and I will plan to send her tumor for NGS testing as well  Depending on the extent of her disease and the plan for kyphoplasty I would at least like to give her one cycle of Herceptin and Perjeta alone without chemotherapy to achieve stump stabilization of her disease while waiting for recovery from kyphoplasty and subsequent radiation treatment  Patient and her friend understand that with stage IV disease treatment would be palliative and not curative however long-term control can be achieved with Herceptin  based chemotherapy.   Total face to face encounter time for this patient visit was 45 min. . Time spent in reviewing outside records as well as discussing patients case with Dr. Doyle Askew    Thank you for this kind referral and the opportunity to participate in the care of this patient   Visit Diagnosis 1. Malignant neoplasm of left female breast, unspecified estrogen receptor status, unspecified site of breast ([place])   2. Abnormal MRI, lumbar spine   3. Abnormal MRI, spine   4. Neoplasm related pain   5. Goals of care, counseling/discussion     Dr. Randa Evens, MD, MPH [facility] at [facility] [phone] 08/25/2019 1:00 PM

## 2019-08-26 ENCOUNTER — Ambulatory Visit
Admission: RE | Admit: 2019-08-26 | Discharge: 2019-08-26 | Disposition: A | Discharge status: Home / Self Care | Payer: Self-pay | Source: Ambulatory Visit | Attending: Oncology | Admitting: Oncology

## 2019-08-26 ENCOUNTER — Encounter: Payer: Self-pay | Admitting: *Deleted

## 2019-08-26 ENCOUNTER — Other Ambulatory Visit
Admission: RE | Admit: 2019-08-26 | Discharge: 2019-08-26 | Disposition: A | Discharge status: Home / Self Care | Payer: Self-pay | Source: Ambulatory Visit | Attending: Vascular Surgery | Admitting: Vascular Surgery

## 2019-08-26 ENCOUNTER — Inpatient Hospital Stay: Payer: Self-pay | Admitting: Oncology

## 2019-08-26 ENCOUNTER — Other Ambulatory Visit: Payer: Self-pay | Admitting: *Deleted

## 2019-08-26 DIAGNOSIS — C50912 Malignant neoplasm of unspecified site of left female breast: Secondary | ICD-10-CM

## 2019-08-26 DIAGNOSIS — R42 Dizziness and giddiness: Secondary | ICD-10-CM | POA: Insufficient documentation

## 2019-08-26 DIAGNOSIS — Z01818 Encounter for other preprocedural examination: Secondary | ICD-10-CM | POA: Insufficient documentation

## 2019-08-26 DIAGNOSIS — C7951 Secondary malignant neoplasm of bone: Secondary | ICD-10-CM

## 2019-08-26 DIAGNOSIS — R937 Abnormal findings on diagnostic imaging of other parts of musculoskeletal system: Secondary | ICD-10-CM

## 2019-08-26 DIAGNOSIS — Z20822 Contact with and (suspected) exposure to covid-19: Secondary | ICD-10-CM | POA: Insufficient documentation

## 2019-08-26 NOTE — Progress Notes (Signed)
*  PRELIMINARY RESULTS* Echocardiogram 2D Echocardiogram has been performed.  Sophia [name] 08/26/2019, 12:31 PM

## 2019-08-26 NOTE — Progress Notes (Signed)
Called patient via [name], the interpreter.  Clarified again if she intends to keep her care at [facility] or go to [facility].  Patient wants to keep her care her.  Discussed that Dr. Janese Banks is working on getting her scheduled to see a orthopedic surgeon at [facility] for her back.  Patient is agreeable to the plan.  Appointment given to see Dr. Baruch Gouty on 09/10/19 @ 1:30.  Patient is a single mom of 3 ages 60,13 and 61, and has not been able to work.  Reviewed what the [employer] helps with and offered to assist with food, utilities and rent.  States she is fine right now that friends are helping, but may need assistance in the future.  One of her biggest concerns is what will happen to her children if she passes away.  She wants to make the appropriate arrangements for them.  States a friend may take them.  Recommended legal council for a will or what to do about her children.  I will also discuss with our social worker and get back with her.  She was encouraged to call with any questions or needs.

## 2019-08-27 ENCOUNTER — Institutional Professional Consult (permissible substitution): Payer: Self-pay | Admitting: Radiation Oncology

## 2019-08-27 ENCOUNTER — Other Ambulatory Visit: Payer: Self-pay | Admitting: *Deleted

## 2019-08-27 ENCOUNTER — Other Ambulatory Visit: Payer: Self-pay

## 2019-08-27 ENCOUNTER — Other Ambulatory Visit (INDEPENDENT_AMBULATORY_CARE_PROVIDER_SITE_OTHER): Payer: Self-pay | Admitting: Nurse Practitioner

## 2019-08-27 ENCOUNTER — Encounter
Admission: RE | Admit: 2019-08-27 | Discharge: 2019-08-27 | Disposition: A | Discharge status: Home / Self Care | Payer: Self-pay | Source: Ambulatory Visit | Attending: Oncology | Admitting: Oncology

## 2019-08-27 DIAGNOSIS — C50912 Malignant neoplasm of unspecified site of left female breast: Secondary | ICD-10-CM | POA: Insufficient documentation

## 2019-08-27 DIAGNOSIS — R937 Abnormal findings on diagnostic imaging of other parts of musculoskeletal system: Secondary | ICD-10-CM | POA: Insufficient documentation

## 2019-08-27 LAB — GLUCOSE, CAPILLARY: Glucose-Capillary: 78 mg/dL (ref 70–99)

## 2019-08-27 LAB — SARS CORONAVIRUS 2 (TAT 6-24 HRS): SARS Coronavirus 2: NEGATIVE

## 2019-08-27 IMAGING — CT NM PET TUM IMG INITIAL (PI) SKULL BASE T - THIGH
1 of 10 series · 1 of 25 positions shown · non-contrast
Comparison: Spinal evaluations of [DATE]
COMPARISON: Spinal evaluations of [DATE]

Addendum:
CLINICAL DATA: Initial treatment strategy for breast cancer.

EXAM:
NUCLEAR MEDICINE PET SKULL BASE TO THIGH
TECHNIQUE: 7.05 mCi F-18 FDG was injected intravenously. Full-ring PET imaging
was performed from the skull base to thigh after the radiotracer. CT
data was obtained and used for attenuation correction and anatomic
localization.
Fasting blood glucose: 78 mg/dl

[Series 4: ct wb 5.0 b30f · axial · 5.0mm · 0.98mm/px · 1 of 251 slices shown]
[im 251/251  brain]
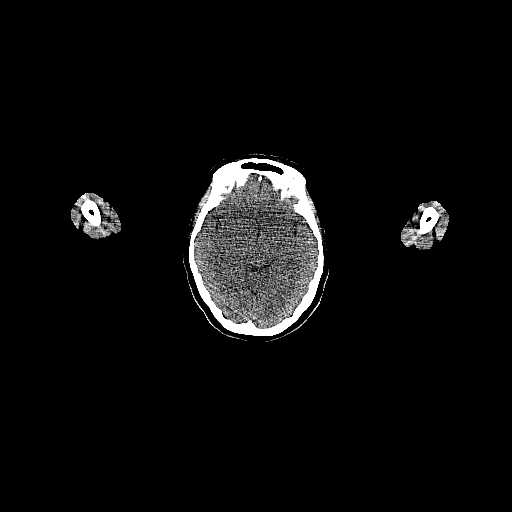

[1 of 25 positions shown; findings below may reference images not displayed]

FINDINGS: Mediastinal blood pool activity: SUV max

Liver activity: SUV max not applicable

NECK: Asymmetric tonsillar activity RIGHT palatine tonsils
approximately select approximately SUV 10.4, on the LEFT 4.5. No
hypermetabolic lymph nodes in the neck, see below for thoracic inlet
lymph nodes.

Incidental CT findings: none

CHEST: LEFT supraclavicular lymph node (image 55 of series 4)
(SUVmax =

LEFT axillary lymph node (image 60, series 4) 7 mm (SUVmax =
with similar size and metabolically active lymph nodes in the area
at least 7 additional lymph nodes. Smaller mildly hypermetabolic
lymph nodes tracking towards the LEFT supraclavicular region under
LEFT subpectoral musculature with a tiny lymph node with the maximum
SUV of 2.5 seen on image 57 of series 4 measuring 4 mm. See below
for rib lesions.) Asymmetric soft tissue in the LEFT breast and
marker for biopsy in an area with a maximum SUV of 7.9 that measures
approximately 2.7 cm maximal dimension (image 80, series 4)

Contralateral axillary lymph node (SUVmax = 2.6 (image 65, series 4)
6 mm.)

Incidental CT findings: Mild calcified atheromatous plaque in the
thoracic aorta. Heart size normal without pericardial effusion.
Central pulmonary vasculature is normal caliber. Signs of basilar
atelectasis question of background ground-glass but no
consolidation, no pleural effusion or suspicious pulmonary nodule.

ABDOMEN/PELVIS: No abnormal hypermetabolic activity within the
liver, pancreas, adrenal glands, or spleen. No hypermetabolic lymph
nodes in the abdomen or pelvis. Bilateral uptake in the adnexa along
the expected course of the LEFT and RIGHT ureter is slightly more
diffuse than expected for ureteral related activity but given
bilateral appearance this is suspected

Incidental CT findings: Cystic structures in the RIGHT adnexa
without FDG uptake likely ovarian cysts largest measuring
approximately 2.7 x 2.4 cm. RIGHT intrarenal calculus. Interpolar
RIGHT kidney 4 mm. No acute gastrointestinal process. The appendix
is normal.

SKELETON: Diffuse skeletal metastatic disease.

LEFT C1/skull base lesion with soft tissue at the foramen magnum
(image 17, series 4) (SUVmax = 9.2 soft tissue in this area
measuring approximately 12 x 12 mm and extending into the occipital
condyle on the LEFT likely and also involving C1.

C2 metastatic focus markedly hypermetabolic (SUVmax = 9.1 associated
with vertebral body destruction measuring approximately 9 x 6 mm.

Destructive changes at the C5 level also with marked hypermetabolic
activity.

Lesion associated with vertebral body destruction at T5-T6 showing
marked hypermetabolic activity better demonstrated on the thoracic
spine MRI.

Pathologic fracture also at the L1 level and with adjacent
destruction of L2 is associated with hypermetabolic activity as are
multiple other foci in the lumbar spine.

Lesion without CT correlate in the RIGHT proximal humerus.
Destructive lesion involving the RIGHT second rib (SUVmax =
signs of metastasis to the sternal body, LEFT anterior third rib,
RIGHT hemi sacrum, bilateral iliac bones, LEFT ischium and RIGHT
femoral neck with smaller focus in the LEFT femoral shaft.

Of particular concern are lesions with bone destruction at the first
sacral level within the sacral ala measuring 2.8 x 1.9 cm (SUVmax =
11.5 similar activity with some bone destruction along the
sacroiliac joint measuring 1.4 by 0.8 cm adjacent to the sacral
lesion. Another area of considerable concern is the cortical
destruction and soft tissue associated with RIGHT femoral neck
(image 216, series 4) (SUVmax = 12.9)

This is followed by the diffuse activity and cortical destruction
noted in the LEFT iliac bone which is associated with extensive
periosteal reaction extending from the ischium through the iliac and
associated with a small fracture in the nonweightbearing portion of
the iliac bone but just above the LEFT acetabulum. (SUVmax = 13.7.)
)

Incidental CT findings: none
IMPRESSION: 1. Widespread metastatic disease to the axial and appendicular
skeleton as described. Redemonstration of metastatic, pathologic
fractures associated with multiple sites of metastatic disease
throughout the spine.
2. Sites of concern for future pathologic fracture of the RIGHT
sacral ala and RIGHT proximal femur.
3. Pathologic fracture along the anterior aspect of the LEFT iliac
bone just above the acetabulum with diffuse iliac and ischial
4. Asymmetric tonsillar/base of tongue activity in the neck is of
uncertain significance. Direct clinical inspection or close
attention on follow-up is suggested.
5. LEFT breast activity and adenopathy as discussed.
6. Area of activity in the bilateral adnexa favored to represent
distal ureters. Attention on follow-up. Correlation could be made
with contrasted imaging of the chest, abdomen and pelvis.

ADDENDUM:
These results will be called to the ordering clinician or
representative by the Radiologist Assistant, and communication
documented in the PACS or [REDACTED].

*** End of Addendum ***
FINDINGS: Mediastinal blood pool activity: SUV max

Liver activity: SUV max not applicable

NECK: Asymmetric tonsillar activity RIGHT palatine tonsils
approximately select approximately SUV 10.4, on the LEFT 4.5. No
hypermetabolic lymph nodes in the neck, see below for thoracic inlet
lymph nodes.

Incidental CT findings: none

CHEST: LEFT supraclavicular lymph node (image 55 of series 4)
(SUVmax =

LEFT axillary lymph node (image 60, series 4) 7 mm (SUVmax =
with similar size and metabolically active lymph nodes in the area
at least 7 additional lymph nodes. Smaller mildly hypermetabolic
lymph nodes tracking towards the LEFT supraclavicular region under
LEFT subpectoral musculature with a tiny lymph node with the maximum
SUV of 2.5 seen on image 57 of series 4 measuring 4 mm. See below
for rib lesions.) Asymmetric soft tissue in the LEFT breast and
marker for biopsy in an area with a maximum SUV of 7.9 that measures
approximately 2.7 cm maximal dimension (image 80, series 4)

Contralateral axillary lymph node (SUVmax = 2.6 (image 65, series 4)
6 mm.)

Incidental CT findings: Mild calcified atheromatous plaque in the
thoracic aorta. Heart size normal without pericardial effusion.
Central pulmonary vasculature is normal caliber. Signs of basilar
atelectasis question of background ground-glass but no
consolidation, no pleural effusion or suspicious pulmonary nodule.

ABDOMEN/PELVIS: No abnormal hypermetabolic activity within the
liver, pancreas, adrenal glands, or spleen. No hypermetabolic lymph
nodes in the abdomen or pelvis. Bilateral uptake in the adnexa along
the expected course of the LEFT and RIGHT ureter is slightly more
diffuse than expected for ureteral related activity but given
bilateral appearance this is suspected

Incidental CT findings: Cystic structures in the RIGHT adnexa
without FDG uptake likely ovarian cysts largest measuring
approximately 2.7 x 2.4 cm. RIGHT intrarenal calculus. Interpolar
RIGHT kidney 4 mm. No acute gastrointestinal process. The appendix
is normal.

SKELETON: Diffuse skeletal metastatic disease.

LEFT C1/skull base lesion with soft tissue at the foramen magnum
(image 17, series 4) (SUVmax = 9.2 soft tissue in this area
measuring approximately 12 x 12 mm and extending into the occipital
condyle on the LEFT likely and also involving C1.

C2 metastatic focus markedly hypermetabolic (SUVmax = 9.1 associated
with vertebral body destruction measuring approximately 9 x 6 mm.

Destructive changes at the C5 level also with marked hypermetabolic
activity.

Lesion associated with vertebral body destruction at T5-T6 showing
marked hypermetabolic activity better demonstrated on the thoracic
spine MRI.

Pathologic fracture also at the L1 level and with adjacent
destruction of L2 is associated with hypermetabolic activity as are
multiple other foci in the lumbar spine.

Lesion without CT correlate in the RIGHT proximal humerus.
Destructive lesion involving the RIGHT second rib (SUVmax =
signs of metastasis to the sternal body, LEFT anterior third rib,
RIGHT hemi sacrum, bilateral iliac bones, LEFT ischium and RIGHT
femoral neck with smaller focus in the LEFT femoral shaft.

Of particular concern are lesions with bone destruction at the first
sacral level within the sacral ala measuring 2.8 x 1.9 cm (SUVmax =
11.5 similar activity with some bone destruction along the
sacroiliac joint measuring 1.4 by 0.8 cm adjacent to the sacral
lesion. Another area of considerable concern is the cortical
destruction and soft tissue associated with RIGHT femoral neck
(image 216, series 4) (SUVmax = 12.9)

This is followed by the diffuse activity and cortical destruction
noted in the LEFT iliac bone which is associated with extensive
periosteal reaction extending from the ischium through the iliac and
associated with a small fracture in the nonweightbearing portion of
the iliac bone but just above the LEFT acetabulum. (SUVmax = 13.7.)
)

Incidental CT findings: none
IMPRESSION: 1. Widespread metastatic disease to the axial and appendicular
skeleton as described. Redemonstration of metastatic, pathologic
fractures associated with multiple sites of metastatic disease
throughout the spine.
2. Sites of concern for future pathologic fracture of the RIGHT
sacral ala and RIGHT proximal femur.
3. Pathologic fracture along the anterior aspect of the LEFT iliac
bone just above the acetabulum with diffuse iliac and ischial
4. Asymmetric tonsillar/base of tongue activity in the neck is of
uncertain significance. Direct clinical inspection or close
attention on follow-up is suggested.
5. LEFT breast activity and adenopathy as discussed.
6. Area of activity in the bilateral adnexa favored to represent
distal ureters. Attention on follow-up. Correlation could be made
with contrasted imaging of the chest, abdomen and pelvis.

## 2019-08-27 MED ORDER — FLUDEOXYGLUCOSE F - 18 (FDG) INJECTION
6.9000 | Freq: Once | INTRAVENOUS | Status: AC | PRN
  Administered 2019-08-27: 7.05 via INTRAVENOUS

## 2019-08-28 ENCOUNTER — Emergency Department: Payer: Self-pay

## 2019-08-28 ENCOUNTER — Encounter
Admission: RE | Disposition: A | Discharge status: Home / Self Care | Payer: Self-pay | Source: Home / Self Care | Attending: Vascular Surgery

## 2019-08-28 ENCOUNTER — Encounter: Payer: Self-pay | Admitting: Emergency Medicine

## 2019-08-28 ENCOUNTER — Inpatient Hospital Stay
Admission: EM | Admit: 2019-08-28 | Discharge: 2019-09-04 | DRG: 478 | Disposition: A | Discharge status: Home Health | Payer: Self-pay | Attending: Internal Medicine | Admitting: Internal Medicine

## 2019-08-28 ENCOUNTER — Encounter: Payer: Self-pay | Admitting: Vascular Surgery

## 2019-08-28 ENCOUNTER — Ambulatory Visit
Admission: RE | Admit: 2019-08-28 | Discharge: 2019-08-28 | Disposition: A | Discharge status: Home / Self Care | Payer: Self-pay | Attending: Vascular Surgery | Admitting: Vascular Surgery

## 2019-08-28 ENCOUNTER — Other Ambulatory Visit: Payer: Self-pay

## 2019-08-28 ENCOUNTER — Telehealth: Payer: Self-pay | Admitting: *Deleted

## 2019-08-28 DIAGNOSIS — Z7289 Other problems related to lifestyle: Secondary | ICD-10-CM

## 2019-08-28 DIAGNOSIS — Z7189 Other specified counseling: Secondary | ICD-10-CM

## 2019-08-28 DIAGNOSIS — M4856XA Collapsed vertebra, not elsewhere classified, lumbar region, initial encounter for fracture: Secondary | ICD-10-CM | POA: Diagnosis present

## 2019-08-28 DIAGNOSIS — F419 Anxiety disorder, unspecified: Secondary | ICD-10-CM

## 2019-08-28 DIAGNOSIS — C50919 Malignant neoplasm of unspecified site of unspecified female breast: Secondary | ICD-10-CM | POA: Diagnosis present

## 2019-08-28 DIAGNOSIS — Z01818 Encounter for other preprocedural examination: Secondary | ICD-10-CM

## 2019-08-28 DIAGNOSIS — R262 Difficulty in walking, not elsewhere classified: Secondary | ICD-10-CM | POA: Diagnosis not present

## 2019-08-28 DIAGNOSIS — R7401 Elevation of levels of liver transaminase levels: Secondary | ICD-10-CM | POA: Diagnosis present

## 2019-08-28 DIAGNOSIS — Z419 Encounter for procedure for purposes other than remedying health state, unspecified: Secondary | ICD-10-CM

## 2019-08-28 DIAGNOSIS — C799 Secondary malignant neoplasm of unspecified site: Secondary | ICD-10-CM

## 2019-08-28 DIAGNOSIS — Z1501 Genetic susceptibility to malignant neoplasm of breast: Secondary | ICD-10-CM

## 2019-08-28 DIAGNOSIS — Z20822 Contact with and (suspected) exposure to covid-19: Secondary | ICD-10-CM | POA: Diagnosis present

## 2019-08-28 DIAGNOSIS — M8440XA Pathological fracture, unspecified site, initial encounter for fracture: Secondary | ICD-10-CM

## 2019-08-28 DIAGNOSIS — M4854XA Collapsed vertebra, not elsewhere classified, thoracic region, initial encounter for fracture: Secondary | ICD-10-CM | POA: Diagnosis present

## 2019-08-28 DIAGNOSIS — Z171 Estrogen receptor negative status [ER-]: Secondary | ICD-10-CM

## 2019-08-28 DIAGNOSIS — C7951 Secondary malignant neoplasm of bone: Principal | ICD-10-CM | POA: Diagnosis present

## 2019-08-28 DIAGNOSIS — Z5112 Encounter for antineoplastic immunotherapy: Secondary | ICD-10-CM

## 2019-08-28 DIAGNOSIS — C50912 Malignant neoplasm of unspecified site of left female breast: Secondary | ICD-10-CM | POA: Insufficient documentation

## 2019-08-28 DIAGNOSIS — T380X5A Adverse effect of glucocorticoids and synthetic analogues, initial encounter: Secondary | ICD-10-CM | POA: Diagnosis present

## 2019-08-28 DIAGNOSIS — D72829 Elevated white blood cell count, unspecified: Secondary | ICD-10-CM

## 2019-08-28 DIAGNOSIS — G893 Neoplasm related pain (acute) (chronic): Secondary | ICD-10-CM

## 2019-08-28 DIAGNOSIS — Z79899 Other long term (current) drug therapy: Secondary | ICD-10-CM

## 2019-08-28 HISTORY — PX: PORTA CATH INSERTION: CATH118285

## 2019-08-28 HISTORY — DX: Malignant (primary) neoplasm, unspecified: C80.1

## 2019-08-28 LAB — CBC WITH DIFFERENTIAL/PLATELET
Abs Immature Granulocytes: 0.37 10*3/uL — ABNORMAL HIGH (ref 0.00–0.07)
Basophils Absolute: 0.1 10*3/uL (ref 0.0–0.1)
Basophils Relative: 0 %
Eosinophils Absolute: 0 10*3/uL (ref 0.0–0.5)
Eosinophils Relative: 0 %
HCT: 41.2 % (ref 36.0–46.0)
Hemoglobin: 14.3 g/dL (ref 12.0–15.0)
Immature Granulocytes: 2 %
Lymphocytes Relative: 5 %
Lymphs Abs: 0.8 10*3/uL (ref 0.7–4.0)
MCH: 29.3 pg (ref 26.0–34.0)
MCHC: 34.7 g/dL (ref 30.0–36.0)
MCV: 84.4 fL (ref 80.0–100.0)
Monocytes Absolute: 0.5 10*3/uL (ref 0.1–1.0)
Monocytes Relative: 3 %
Neutro Abs: 15 10*3/uL — ABNORMAL HIGH (ref 1.7–7.7)
Neutrophils Relative %: 90 %
Platelets: 446 10*3/uL — ABNORMAL HIGH (ref 150–400)
RBC: 4.88 MIL/uL (ref 3.87–5.11)
RDW: 12.8 % (ref 11.5–15.5)
WBC: 16.7 10*3/uL — ABNORMAL HIGH (ref 4.0–10.5)
nRBC: 0 % (ref 0.0–0.2)

## 2019-08-28 LAB — COMPREHENSIVE METABOLIC PANEL
ALT: 68 U/L — ABNORMAL HIGH (ref 0–44)
AST: 46 U/L — ABNORMAL HIGH (ref 15–41)
Albumin: 3.4 g/dL — ABNORMAL LOW (ref 3.5–5.0)
Alkaline Phosphatase: 99 U/L (ref 38–126)
Anion gap: 8 (ref 5–15)
BUN: 21 mg/dL — ABNORMAL HIGH (ref 6–20)
CO2: 24 mmol/L (ref 22–32)
Calcium: 8.8 mg/dL — ABNORMAL LOW (ref 8.9–10.3)
Chloride: 101 mmol/L (ref 98–111)
Creatinine, Ser: 0.61 mg/dL (ref 0.44–1.00)
GFR calc Af Amer: >60 mL/min (ref >60)
GFR calc non Af Amer: >60 mL/min (ref >60)
Glucose, Bld: 130 mg/dL — ABNORMAL HIGH (ref 70–99)
Potassium: 3.8 mmol/L (ref 3.5–5.1)
Sodium: 133 mmol/L — ABNORMAL LOW (ref 135–145)
Total Bilirubin: 0.6 mg/dL (ref 0.3–1.2)
Total Protein: 7.1 g/dL (ref 6.5–8.1)

## 2019-08-28 LAB — URINALYSIS, COMPLETE (UACMP) WITH MICROSCOPIC
Bacteria, UA: NONE SEEN
Bilirubin Urine: NEGATIVE
Glucose, UA: NEGATIVE mg/dL
Hgb urine dipstick: NEGATIVE
Ketones, ur: NEGATIVE mg/dL
Nitrite: NEGATIVE
Protein, ur: NEGATIVE mg/dL
Specific Gravity, Urine: 1.008 (ref 1.005–1.030)
pH: 6 (ref 5.0–8.0)

## 2019-08-28 LAB — POCT PREGNANCY, URINE: Preg Test, Ur: NEGATIVE

## 2019-08-28 IMAGING — CR DG HIP (WITH OR WITHOUT PELVIS) 2-3V*R*
3 series · 3 of 3 positions shown · non-contrast
Comparison: None.

CLINICAL DATA: Right hip pain. History of metastatic breast cancer.

EXAM:
DG HIP (WITH OR WITHOUT PELVIS) 2-3V RIGHT

[pelvis ap]
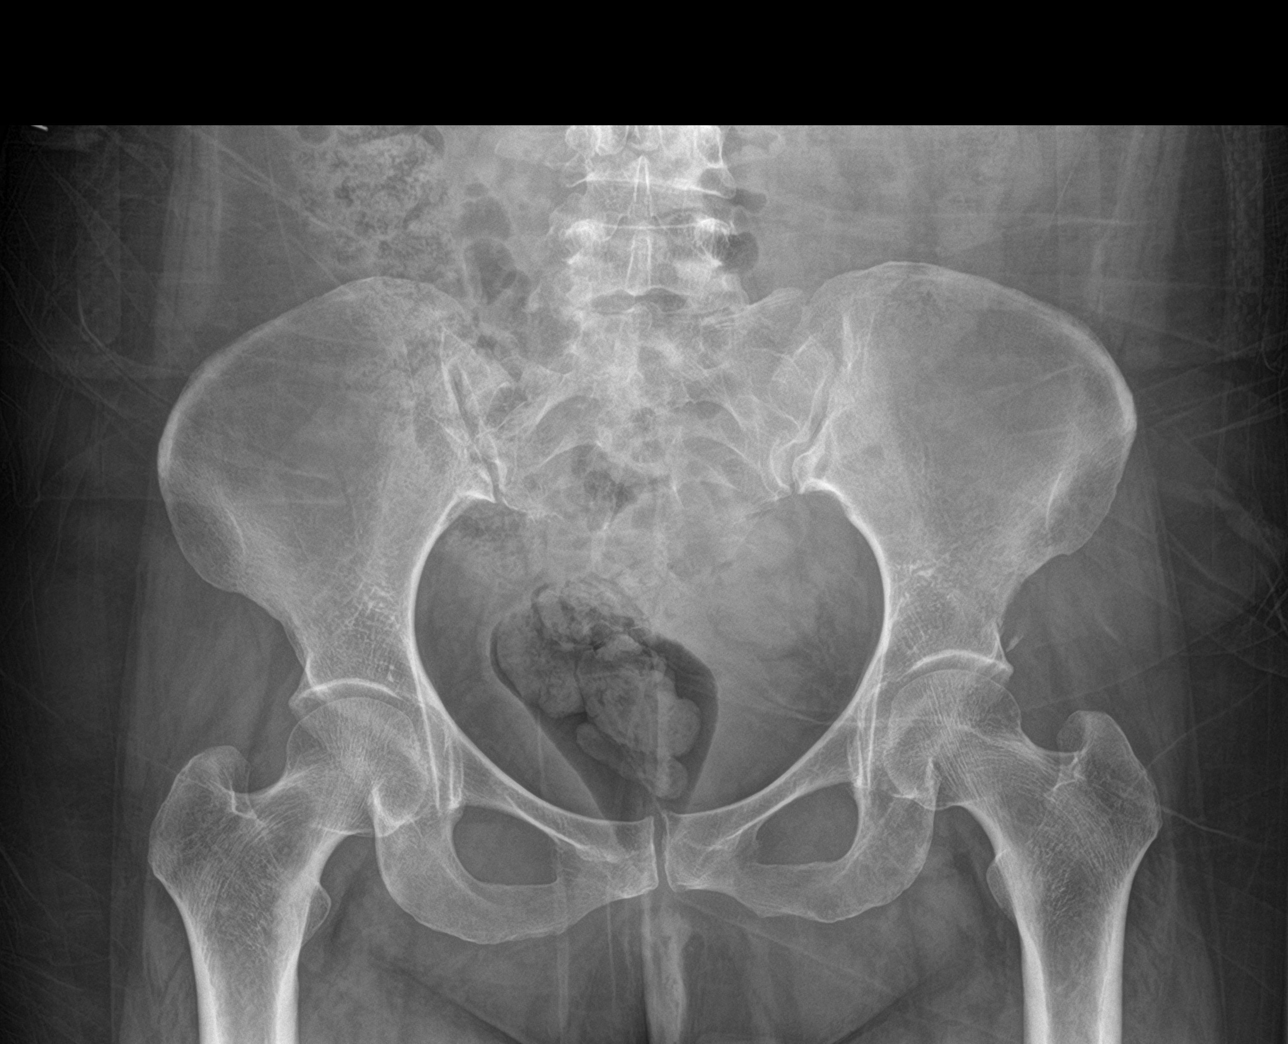

[hip ap]
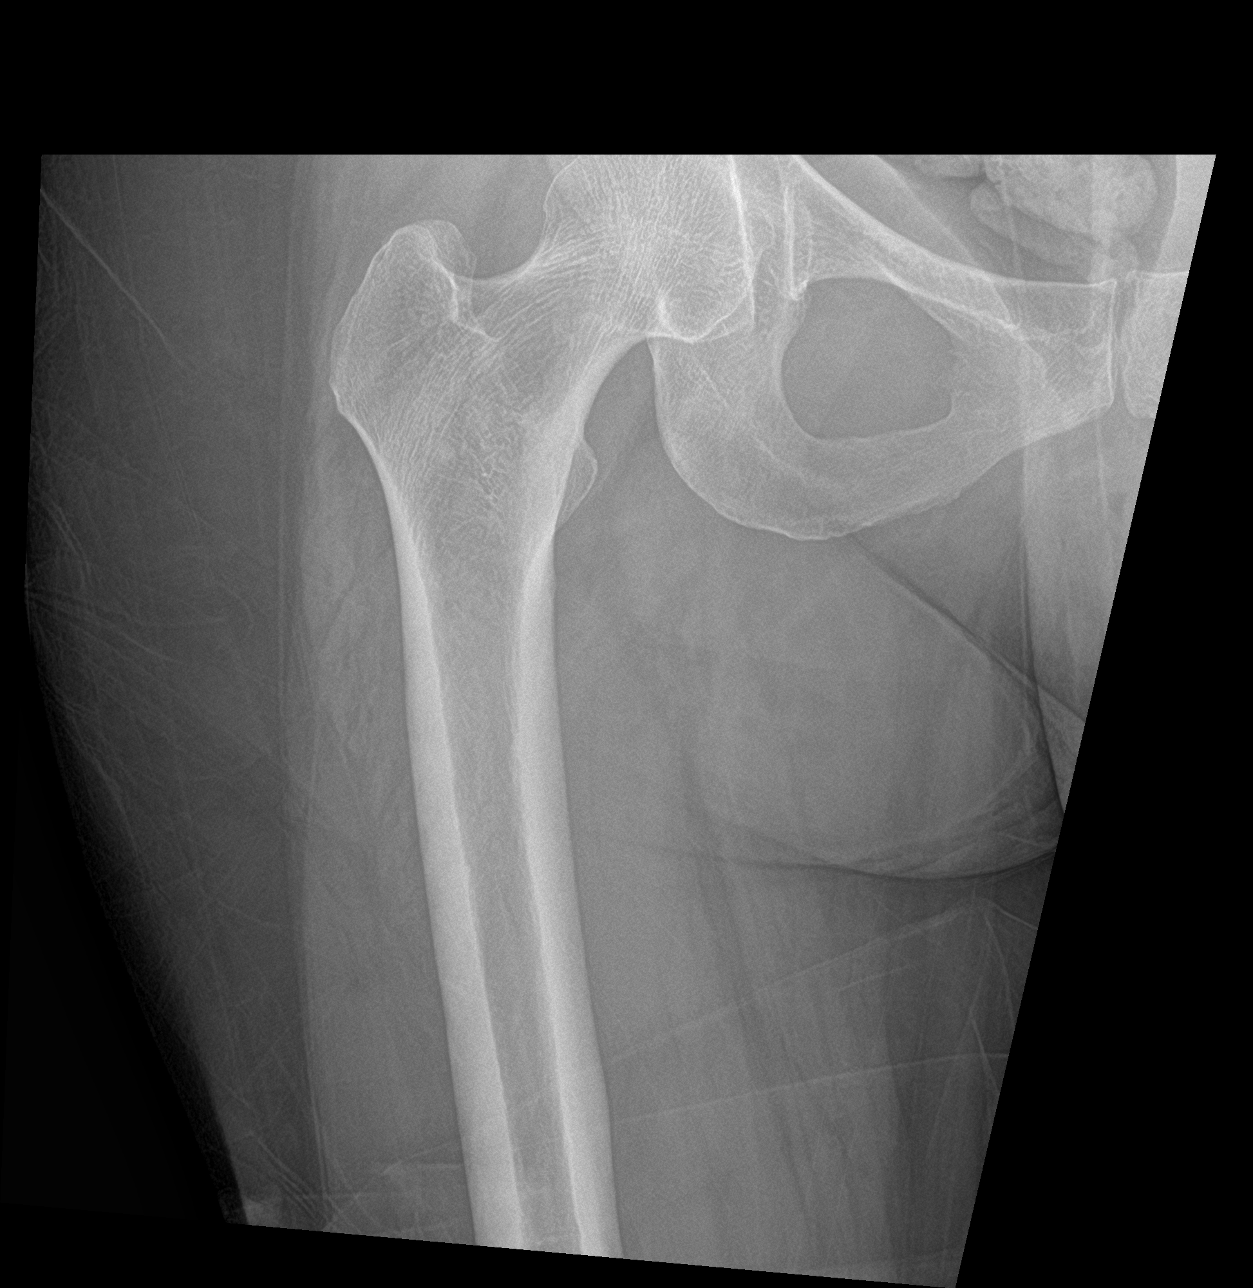

[hip lat]
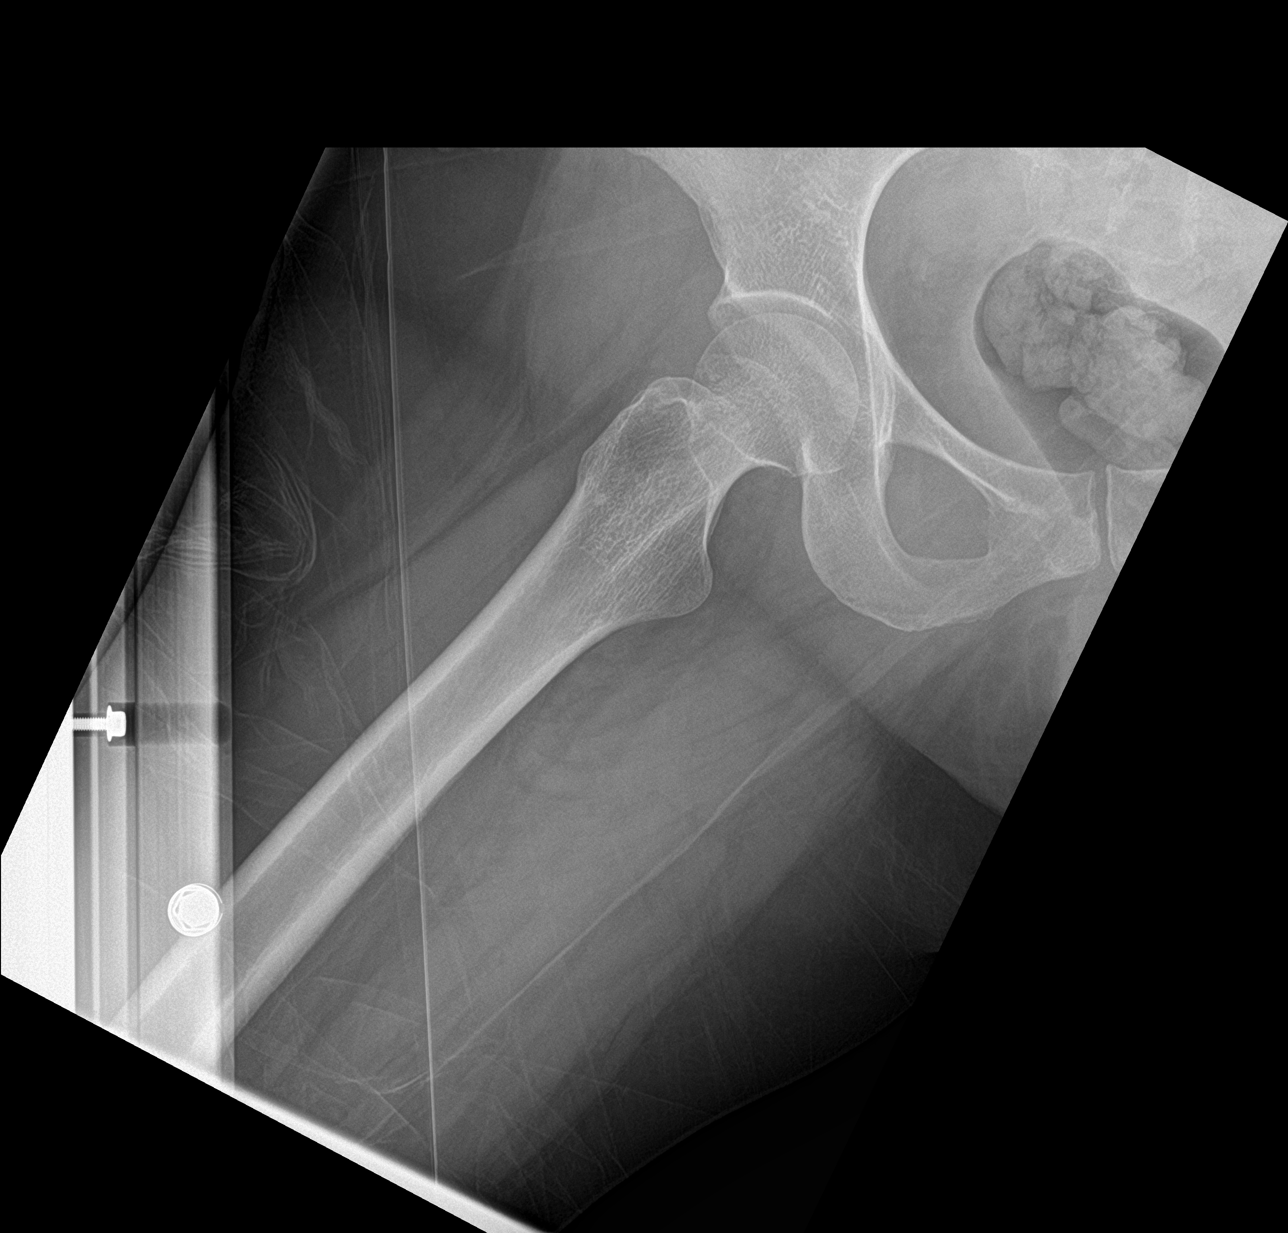

[3 of 3 positions shown; findings below may reference images not displayed]

FINDINGS: There is no evidence of hip fracture or dislocation. There is no
evidence of arthropathy. A 4 mm round sclerotic focus is seen within
the inter trochanteric region of the proximal right femur.
IMPRESSION: Small sclerotic focus within the proximal right femur which may
represent a small bone island. Given the patient's history of
metastatic breast cancer, a small metastatic focus cannot be
excluded.

## 2019-08-28 SURGERY — PORTA CATH INSERTION
Anesthesia: Moderate Sedation

## 2019-08-28 MED ORDER — LORAZEPAM 1 MG PO TABS
1.0000 mg | ORAL_TABLET | Freq: Once | ORAL | Status: AC
  Administered 2019-08-28: 1 mg via ORAL
  Filled 2019-08-28: qty 1

## 2019-08-28 MED ORDER — LORAZEPAM 2 MG/ML IJ SOLN: 0.5000 mg | Freq: Four times a day (QID) | INTRAMUSCULAR | Status: DC | PRN

## 2019-08-28 MED ORDER — MIDAZOLAM HCL 2 MG/ML PO SYRP: 8.0000 mg | ORAL_SOLUTION | Freq: Once | ORAL | Status: DC | PRN

## 2019-08-28 MED ORDER — CEFAZOLIN SODIUM-DEXTROSE 2-4 GM/100ML-% IV SOLN: 2.0000 g | Freq: Once | INTRAVENOUS | Status: AC

## 2019-08-28 MED ORDER — MIDAZOLAM HCL 2 MG/2ML IJ SOLN
INTRAMUSCULAR | Status: DC | PRN
  Administered 2019-08-28: 1 mg via INTRAVENOUS
  Administered 2019-08-28: 2 mg via INTRAVENOUS

## 2019-08-28 MED ORDER — MORPHINE SULFATE (PF) 2 MG/ML IV SOLN
2.0000 mg | INTRAVENOUS | Status: DC | PRN
  Administered 2019-08-28 – 2019-09-02 (×8): 2 mg via INTRAVENOUS
  Filled 2019-08-28 (×8): qty 1

## 2019-08-28 MED ORDER — MIDAZOLAM HCL 5 MG/5ML IJ SOLN
INTRAMUSCULAR | Status: AC
  Filled 2019-08-28: qty 5

## 2019-08-28 MED ORDER — KETOROLAC TROMETHAMINE 30 MG/ML IJ SOLN: 30.0000 mg | Freq: Four times a day (QID) | INTRAMUSCULAR | Status: DC | PRN

## 2019-08-28 MED ORDER — ONDANSETRON HCL 4 MG/2ML IJ SOLN: 4.0000 mg | Freq: Four times a day (QID) | INTRAMUSCULAR | Status: DC | PRN

## 2019-08-28 MED ORDER — SODIUM CHLORIDE 0.9 % IV SOLN: INTRAVENOUS | Status: DC

## 2019-08-28 MED ORDER — METHYLPREDNISOLONE SODIUM SUCC 125 MG IJ SOLR: 125.0000 mg | Freq: Once | INTRAMUSCULAR | Status: DC | PRN

## 2019-08-28 MED ORDER — SODIUM CHLORIDE 0.9 % IV SOLN
Freq: Once | INTRAVENOUS | Status: AC
  Filled 2019-08-28: qty 80

## 2019-08-28 MED ORDER — ONDANSETRON HCL 4 MG PO TABS: 4.0000 mg | ORAL_TABLET | Freq: Four times a day (QID) | ORAL | Status: DC | PRN

## 2019-08-28 MED ORDER — DEXAMETHASONE SODIUM PHOSPHATE 10 MG/ML IJ SOLN
8.0000 mg | Freq: Two times a day (BID) | INTRAMUSCULAR | Status: DC
  Administered 2019-08-28 – 2019-09-02 (×10): 8 mg via INTRAVENOUS
  Filled 2019-08-28 (×10): qty 1

## 2019-08-28 MED ORDER — FENTANYL CITRATE (PF) 100 MCG/2ML IJ SOLN
INTRAMUSCULAR | Status: DC | PRN
  Administered 2019-08-28: 50 ug via INTRAVENOUS
  Administered 2019-08-28: 25 ug via INTRAVENOUS

## 2019-08-28 MED ORDER — DIPHENHYDRAMINE HCL 50 MG/ML IJ SOLN: 50.0000 mg | Freq: Once | INTRAMUSCULAR | Status: DC | PRN

## 2019-08-28 MED ORDER — FAMOTIDINE 20 MG PO TABS: 40.0000 mg | ORAL_TABLET | Freq: Once | ORAL | Status: DC | PRN

## 2019-08-28 MED ORDER — FENTANYL CITRATE (PF) 100 MCG/2ML IJ SOLN
INTRAMUSCULAR | Status: AC
  Filled 2019-08-28: qty 2

## 2019-08-28 MED ORDER — CHLORHEXIDINE GLUCONATE CLOTH 2 % EX PADS
6.0000 | MEDICATED_PAD | Freq: Every day | CUTANEOUS | Status: DC
  Administered 2019-08-28: 6 via TOPICAL

## 2019-08-28 MED ORDER — HYDROMORPHONE HCL 1 MG/ML IJ SOLN: 1.0000 mg | Freq: Once | INTRAMUSCULAR | Status: DC | PRN

## 2019-08-28 MED ORDER — CEFAZOLIN SODIUM-DEXTROSE 2-4 GM/100ML-% IV SOLN
INTRAVENOUS | Status: AC
  Administered 2019-08-28: 2 g via INTRAVENOUS
  Filled 2019-08-28: qty 100

## 2019-08-28 SURGICAL SUPPLY — 13 items
DERMABOND ADVANCED (GAUZE/BANDAGES/DRESSINGS) ×1
DERMABOND ADVANCED .7 DNX12 (GAUZE/BANDAGES/DRESSINGS) ×1 IMPLANT
ELECT REM PT RETURN 9FT ADLT (ELECTROSURGICAL) ×2
ELECTRODE REM PT RTRN 9FT ADLT (ELECTROSURGICAL) ×1 IMPLANT
KIT PORT POWER 8FR ISP CVUE (Port) ×2 IMPLANT
PACK ANGIOGRAPHY (CUSTOM PROCEDURE TRAY) ×2 IMPLANT
PENCIL ELECTRO HAND CTR (MISCELLANEOUS) ×2 IMPLANT
SUT MNCRL 4-0 (SUTURE) ×1
SUT MNCRL 4-0 27XMFL (SUTURE) ×1
SUT PROLENE 0 CT 1 30 (SUTURE) ×2 IMPLANT
SUT VIC AB 3-0 CT1 27 (SUTURE) ×1
SUT VIC AB 3-0 CT1 TAPERPNT 27 (SUTURE) ×1 IMPLANT
SUTURE MNCRL 4-0 27XMF (SUTURE) ×1 IMPLANT

## 2019-08-28 NOTE — ED Provider Notes (Signed)
ER Provider Note       Time seen: 7:50 PM    I have reviewed the vital signs and the nursing notes.  HISTORY   Chief Complaint Back Pain    HPI Carlisle Torgeson is a 39 y.o. female with a history of anxiety, widespread breast cancer, vertigo who presents today for concerns about outpatient test that was done today.  She had a Port-A-Cath placed today, was called by oncology to return to the ER regarding her outpatient MRI and PET scan.  Patient states her right leg gives out on occasion, denies loss of bowel or bladder function.  Past Medical History:  Diagnosis Date  . Anxiety   . Cancer ([place])   . Vertigo     Past Surgical History:  Procedure Laterality Date  . BREAST BIOPSY Left 08/14/2019   [place] bx of mass, coil marker, path pending  . BREAST BIOPSY Left 08/14/2019   [place] bx of LN, hydromarker, path pending  . BREAST BIOPSY Left 08/14/2019   affirm bx of calcs, x marker, path pending  . PORTA CATH INSERTION N/A 08/28/2019   Procedure: PORTA CATH INSERTION;  Surgeon: Algernon Huxley, MD;  Location: [facility];  Service: Cardiovascular;  Laterality: N/A;    Allergies Patient has no known allergies.  Review of Systems Constitutional: Negative for fever. Cardiovascular: Negative for chest pain. Respiratory: Negative for shortness of breath. Gastrointestinal: Negative for abdominal pain, vomiting and diarrhea. Musculoskeletal: Positive for back pain Skin: Negative for rash. Neurological: Positive for right leg weakness  All systems negative/normal/unremarkable except as stated in the HPI  ____________________________________________   PHYSICAL EXAM:  VITAL SIGNS: Vitals:   08/28/19 1936  BP: (!) 140/92  Pulse: 91  Resp: 16  Temp: 98.5 F (36.9 C)  SpO2: 99%    Constitutional: Alert and oriented.  No acute distress Eyes: Conjunctivae are normal. Normal extraocular movements. Cardiovascular: Normal rate, regular rhythm. No murmurs, rubs, or  gallops. Respiratory: Normal respiratory effort without tachypnea nor retractions. Breath sounds are clear and equal bilaterally. No wheezes/rales/rhonchi. Gastrointestinal: Soft and nontender. Normal bowel sounds Musculoskeletal: Limited range of motion of the right hip, right leg weakness is noted.. Neurologic:  Normal speech and language. No gross focal neurologic deficits are appreciated.  Right leg weakness is noted, difficulty walking due to weakness in the right leg Skin:  Skin is warm, dry and intact. No rash noted. Psychiatric: Speech and behavior are normal.  ____________________________________________   LABS (pertinent positives/negatives)  Labs Reviewed - No data to display  RADIOLOGY  Images were viewed by me PET scan IMPRESSION: 1. 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 Redemonstration of metastatic, pathologic fractures associated with multiple sites of metastatic disease throughout the spine. 2. Sites of concern for future pathologic fracture of the RIGHT sacral ala and RIGHT proximal femur. 3. Pathologic fracture along the anterior aspect of the LEFT iliac bone just above the acetabulum with diffuse iliac and ischial 4. Asymmetric tonsillar/base of tongue activity in the neck is of uncertain significance. Direct clinical inspection or close attention on follow-up is suggested. 5. LEFT breast activity and adenopathy as discussed. 6. Area of activity in the bilateral adnexa favored to represent distal ureters. Attention on follow-up. Correlation could be made with contrasted imaging of the chest, abdomen and pelvis.  DIFFERENTIAL DIAGNOSIS  Metastatic cancer, cauda equina syndrome, compression fracture  ASSESSMENT AND PLAN  Metastatic cancer   Plan: The patient had presented for metastatic cancer  and worsening pain.  The concern is that she needs kyphoplasty and potential fixation of her right femur.  There have  been ongoing discussions between oncology, orthopedics and neurosurgery about this.  There is also been a request to get MRI brain with and without contrast to expedite her work-up.  Lenise Arena MD    Note: This note was generated in part or whole with voice recognition software. Voice recognition is usually quite accurate but there are transcription errors that can and very often do occur. I apologize for any typographical errors that were not detected and corrected.     Earleen Newport, MD 08/28/19 2023

## 2019-08-28 NOTE — H&P (Signed)
History and Physical    Sophia [name] ZOX:[id number] DOB: 09-30-1980 DOA: 08/28/2019  PCP: [facility], [place], NP   Patient coming from: Home  I have personally briefly reviewed patient's old medical records in [place]  Chief Complaint: Pathological fractures, sent in by oncology  HPI: Sophia [name] is a 39 y.o. 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  Patient was seen by Dr. Janese Banks on 6 4:20 AM was placed on oxycodone and dexamethasone.  Patient had port placement by Dr. Lucky Cowboy early today.  The emergency room provider, Dr. Jimmye Norman spoke with oncologist, Dr. Janese Banks, neurosurgeon Dr. Cari Caraway and orthopedist Dr. Rudene Christians with decision for kyphoplasty.  Work-up in the ER otherwise notable for WBC of 16,000,. hospitalist consulted for admission.  Review of Systems: As per HPI otherwise 10 point review of systems negative.    Past Medical History:  Diagnosis Date  . Anxiety   . Cancer ([place])   . Vertigo     Past Surgical History:  Procedure Laterality Date  . BREAST BIOPSY Left 08/14/2019   [place] bx of mass, coil marker, path pending  . BREAST BIOPSY Left 08/14/2019   [place] bx of LN, hydromarker, path pending  . BREAST BIOPSY Left 08/14/2019   affirm bx of calcs, x marker, path pending  . PORTA CATH INSERTION N/A 08/28/2019   Procedure: PORTA CATH INSERTION;  Surgeon: Algernon Huxley, MD;  Location: [facility];  Service: Cardiovascular;  Laterality: N/A;     reports that she has never smoked. She has never used smokeless tobacco. She reports current alcohol use. She reports previous drug use.  No Known  Allergies  Family History  Problem Relation Age of Onset  . Colon cancer Maternal Uncle      Prior to Admission medications   Medication Sig Start Date End Date Taking? Authorizing Provider  cyclobenzaprine (FLEXERIL) 10 MG tablet Take 1 tablet (10 mg total) by mouth 3 (three) times daily as needed. 07/22/19   Fisher, Linden Dolin, PA-C  cyclobenzaprine (FLEXERIL) 10 MG tablet Take by mouth. 08/06/19   [provider]  dexamethasone (DECADRON) 4 MG tablet Take 2 tablets (8 mg total) by mouth 2 (two) times daily with a meal. 08/22/19   Sindy Guadeloupe, MD  LORazepam (ATIVAN) 0.5 MG tablet Take 1 tablet (0.5 mg total) by mouth every 6 (six) hours as needed for anxiety. 08/22/19   Sindy Guadeloupe, MD  meclizine (ANTIVERT) 25 MG tablet Take 1 tablet (25 mg total) by mouth 3 (three) times daily as needed for dizziness. Patient not taking: Reported on 08/22/2019 04/09/19   Coral Spikes, DO  meloxicam (MOBIC) 15 MG tablet Take 1 tablet (15 mg total) by mouth daily. Patient not taking: Reported on 08/22/2019 07/22/19 07/21/20  Caryn Section Linden Dolin, PA-C  naphazoline-pheniramine (NAPHCON-A) 0.025-0.3 % ophthalmic solution 1 drop Four (4) times a day.    [provider]  oxyCODONE (OXY IR/ROXICODONE) 5 MG immediate release tablet Take 1 tablet (5 mg total) by mouth every 4 (four) hours as needed for severe pain. 08/22/19   Sindy Guadeloupe, MD  predniSONE (DELTASONE) 10 MG tablet #48 tablets, take as directed Patient not taking: Reported  on 08/28/2019 07/31/19   [provider]    Physical Exam: Vitals:   08/28/19 1936 08/28/19 1938  BP: (!) 140/92   Pulse: 91   Resp: 16   Temp: 98.5 F (36.9 C)   TempSrc: Oral   SpO2: 99%   Weight:  60.8 kg  Height:  5\' 1"  (1.549 m)     Vitals:   08/28/19 1936 08/28/19 1938  BP: (!) 140/92   Pulse: 91   Resp: 16   Temp: 98.5 F (36.9 C)   TempSrc: Oral   SpO2: 99%   Weight:  60.8 kg  Height:  5\' 1"  (1.549 m)      Constitutional: Alert and  oriented x 3 . Not in any apparent distress HEENT:      Head: Normocephalic and atraumatic.         Eyes: PERLA, EOMI, Conjunctivae are normal. Sclera is non-icteric.       Mouth/Throat: Mucous membranes are moist.       Neck: Supple with no signs of meningismus. Cardiovascular: Regular rate and rhythm. No murmurs, gallops, or rubs. 2+ symmetrical distal pulses are present . No JVD. No LE edema Respiratory: Respiratory effort normal .Lungs sounds clear bilaterally. No wheezes, crackles, or rhonchi.  Gastrointestinal: Soft, non tender, and non distended with positive bowel sounds. No rebound or guarding. Genitourinary: No CVA tenderness. Musculoskeletal:  No edema, cyanosis, or erythema of extremities.  Range of motion not examined due to known pathologic fractures Neurologic: Normal speech and language. Face is symmetric. Moving all extremities. No gross focal neurologic deficits . Skin: Skin is warm, dry.  No rash or ulcers Psychiatric: Mood and affect are normal Speech and behavior are normal   Labs on Admission: I have personally reviewed following labs and imaging studies  CBC: Recent Labs  Lab 08/22/19 1603 08/28/19 2044  WBC 8.8 16.7*  NEUTROABS 7.0 15.0*  HGB 13.3 14.3  HCT 39.5 41.2  MCV 87.8 84.4  PLT 305 546*   Basic Metabolic Panel: Recent Labs  Lab 08/22/19 1603 08/28/19 2044  NA 139 133*  K 4.1 3.8  CL 103 101  CO2 27 24  GLUCOSE 111* 130*  BUN 13 21*  CREATININE 0.78 0.61  CALCIUM 8.9 8.8*   GFR: Estimated Creatinine Clearance: 78.2 mL/min (by C-G formula based on SCr of 0.61 mg/dL). Liver Function Tests: Recent Labs  Lab 08/22/19 1603 08/28/19 2044  AST 28 46*  ALT 32 68*  ALKPHOS 96 99  BILITOT 0.6 0.6  PROT 7.6 7.1  ALBUMIN 3.9 3.4*   No results for input(s): LIPASE, AMYLASE in the last 168 hours. No results for input(s): AMMONIA in the last 168 hours. Coagulation Profile: No results for input(s): INR, PROTIME in the last 168  hours. Cardiac Enzymes: No results for input(s): CKTOTAL, CKMB, CKMBINDEX, TROPONINI in the last 168 hours. BNP (last 3 results) No results for input(s): PROBNP in the last 8760 hours. HbA1C: No results for input(s): HGBA1C in the last 72 hours. CBG: Recent Labs  Lab 08/27/19 1224  GLUCAP 78   Lipid Profile: No results for input(s): CHOL, HDL, LDLCALC, TRIG, CHOLHDL, LDLDIRECT in the last 72 hours. Thyroid Function Tests: No results for input(s): TSH, T4TOTAL, FREET4, T3FREE, THYROIDAB in the last 72 hours. Anemia Panel: No results for input(s): VITAMINB12, FOLATE, FERRITIN, TIBC, IRON, RETICCTPCT in the last 72 hours. Urine analysis:    Component Value Date/Time   COLORURINE STRAW (A) 08/28/2019 2044   APPEARANCEUR CLEAR (A) 08/28/2019 2044  LABSPEC 1.008 08/28/2019 2044   PHURINE 6.0 08/28/2019 2044   GLUCOSEU NEGATIVE 08/28/2019 2044   HGBUR NEGATIVE 08/28/2019 2044   BILIRUBINUR NEGATIVE 08/28/2019 2044   KETONESUR NEGATIVE 08/28/2019 2044   PROTEINUR NEGATIVE 08/28/2019 2044   NITRITE NEGATIVE 08/28/2019 2044   LEUKOCYTESUR TRACE (A) 08/28/2019 2044    Radiological Exams on Admission: PERIPHERAL VASCULAR CATHETERIZATION  Result Date: 08/28/2019 See op note  NM PET Image Initial (PI) Skull Base To Thigh  Addendum Date: 08/27/2019   ADDENDUM REPORT: 08/27/2019 17:27 ADDENDUM: These results will be called to the ordering clinician or representative by the Radiologist Assistant, and communication documented in the PACS or [employer]. Electronically Signed   By: Zetta Bills M.D.   On: 08/27/2019 17:27   Result Date: 08/27/2019 CLINICAL DATA:  Initial treatment strategy for breast cancer. EXAM: NUCLEAR MEDICINE PET SKULL BASE TO THIGH TECHNIQUE: 7.05 mCi F-18 FDG was injected intravenously. Full-ring PET imaging was performed from the skull base to thigh after the radiotracer. CT data was obtained and used for attenuation correction and anatomic localization.  Fasting blood glucose: 78 mg/dl COMPARISON:  Spinal evaluations of 08/25/2019 FINDINGS: Mediastinal blood pool activity: SUV max 2.72 Liver activity: SUV max not applicable NECK: Asymmetric tonsillar activity RIGHT palatine tonsils approximately select approximately SUV 10.4, on the LEFT 4.5. No hypermetabolic lymph nodes in the neck, see below for thoracic inlet lymph nodes. Incidental CT findings: none CHEST: LEFT supraclavicular lymph node (image 55 of series 4) (SUVmax = 3.7 LEFT axillary lymph node (image 60, series 4) 7 mm (SUVmax = 5.1 with similar size and metabolically active lymph nodes in the area at least 7 additional lymph nodes. Smaller mildly hypermetabolic lymph nodes tracking towards the LEFT supraclavicular region under LEFT subpectoral musculature with a tiny lymph node with the maximum SUV of 2.5 seen on image 57 of series 4 measuring 4 mm. See below for rib lesions.) Asymmetric soft tissue in the LEFT breast and marker for biopsy in an area with a maximum SUV of 7.9 that measures approximately 2.7 cm maximal dimension (image 80, series 4) Contralateral axillary lymph node (SUVmax = 2.6 (image 65, series 4) 6 mm.) Incidental CT findings: Mild calcified atheromatous plaque in the thoracic aorta. Heart size normal without pericardial effusion. Central pulmonary vasculature is normal caliber. Signs of basilar atelectasis question of background ground-glass but no consolidation, no pleural effusion or suspicious pulmonary nodule. ABDOMEN/PELVIS: No abnormal hypermetabolic activity within the liver, pancreas, adrenal glands, or spleen. No hypermetabolic lymph nodes in the abdomen or pelvis. Bilateral uptake in the adnexa along the expected course of the LEFT and RIGHT ureter is slightly more diffuse than expected for ureteral related activity but given bilateral appearance this is suspected Incidental CT findings: Cystic structures in the RIGHT adnexa without FDG uptake likely ovarian cysts largest  measuring approximately 2.7 x 2.4 cm. RIGHT intrarenal calculus. Interpolar RIGHT kidney 4 mm. No acute gastrointestinal process. The appendix is normal. SKELETON: Diffuse skeletal metastatic disease. LEFT C1/skull base lesion with soft tissue at the foramen magnum (image 17, series 4) (SUVmax = 9.2 soft tissue in this area measuring approximately 12 x 12 mm and extending into the occipital condyle on the LEFT likely and also involving C1. C2 metastatic focus markedly hypermetabolic (SUVmax = 9.1 associated with vertebral body destruction measuring approximately 9 x 6 mm. Destructive changes at the C5 level also with marked hypermetabolic activity. Lesion associated with vertebral body destruction at T5-T6 showing marked hypermetabolic activity better demonstrated on  the thoracic spine MRI. Pathologic fracture also at the L1 level and with adjacent destruction of L2 is associated with hypermetabolic activity as are multiple other foci in the lumbar spine. Lesion without CT correlate in the RIGHT proximal humerus. Destructive lesion involving the RIGHT second rib (SUVmax = 13.6 signs of metastasis to the sternal body, LEFT anterior third rib, RIGHT hemi sacrum, bilateral iliac bones, LEFT ischium and RIGHT femoral neck with smaller focus in the LEFT femoral shaft. Of particular concern are lesions with bone destruction at the first sacral level within the sacral ala measuring 2.8 x 1.9 cm (SUVmax = 11.5 similar activity with some bone destruction along the sacroiliac joint measuring 1.4 by 0.8 cm adjacent to the sacral lesion. Another area of considerable concern is the cortical destruction and soft tissue associated with RIGHT femoral neck (image 216, series 4) (SUVmax = 12.9) This is followed by the diffuse activity and cortical destruction noted in the LEFT iliac bone which is associated with extensive periosteal reaction extending from the ischium through the iliac and associated with a small fracture in the  nonweightbearing portion of the iliac bone but just above the LEFT acetabulum. (SUVmax = 13.7.) ) Incidental CT findings: none IMPRESSION: 1. 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 Redemonstration of metastatic, pathologic fractures associated with multiple sites of metastatic disease throughout the spine. 2. Sites of concern for future pathologic fracture of the RIGHT sacral ala and RIGHT proximal femur. 3. Pathologic fracture along the anterior aspect of the LEFT iliac bone just above the acetabulum with diffuse iliac and ischial 4. Asymmetric tonsillar/base of tongue activity in the neck is of uncertain significance. Direct clinical inspection or close attention on follow-up is suggested. 5. LEFT breast activity and adenopathy as discussed. 6. Area of activity in the bilateral adnexa favored to represent distal ureters. Attention on follow-up. Correlation could be made with contrasted imaging of the chest, abdomen and pelvis. Electronically Signed: By: Zetta Bills M.D. On: 08/27/2019 17:23   DG Hip Unilat W or Wo Pelvis 2-3 Views Right  Result Date: 08/28/2019 CLINICAL DATA:  Right hip pain. History of metastatic breast cancer. EXAM: DG HIP (WITH OR WITHOUT PELVIS) 2-3V RIGHT COMPARISON:  None. FINDINGS: There is no evidence of hip fracture or dislocation. There is no evidence of arthropathy. A 4 mm round sclerotic focus is seen within the inter trochanteric region of the proximal right femur. IMPRESSION: Small sclerotic focus within the proximal right femur which may represent a small bone island. Given the patient's history of metastatic breast cancer, a small metastatic focus cannot be excluded. Electronically Signed   By: Virgina Norfolk M.D.   On: 08/28/2019 21:01    EKG: Independently reviewed.   Assessment/Plan Principal Problem:   Multiple pathological fractures   Primary cancer of left breast with metastasis to other site [facility]) -Recently  diagnosed with metastatic breast cancer after initially presenting to [facility] on 07/31/2019 with a 1 month history of low back and bilateral leg pain -PET scan with widespread metastatic disease to the axial and appendicular skeleton with pathologic fractures of multiple sites of metastatic disease throughout the spine with concern for impending fracture of the right sacral ala and right proximal femur -Keep n.p.o. for kyphoplasty in the a.m. -Pain control with as needed morphine -Per Dr. Janese Banks on 08/22/2019: Plan to give 1 cycle of Herceptin and Perjeta without chemotherapy to achieve stump stabilization while awaiting recovery from kyphoplasty and subsequent radiation treatment -The Dr.  Brownell: Decadron 8 mg twice daily for 10 days from 6/4 to 6/14 and Ativan for anxiety -As needed morphine for pain control, as needed IV Ativan for anxiety, IV Decadron to replace home oral Decadron while n.p.o. -Dr. Algis Downs consulted for kyphoplasty per prior conversation with ER provider -Consult oncology in the a.m. for continued coordination of care    Preoperative clearance -No contraindications.  Patient with no prior medical history and benefits  outweigh risks    Anxiety -Xanax as needed  Leukocytosis -Likely steroid-induced.  Continue to monitor    DVT prophylaxis: SCDs only for procedure Code Status: full code  Family Communication:  none  Disposition Plan: Back to previous home environment Consults called: dr. Rudene Christians  Status:obs    Athena Masse MD [facility]     08/28/2019, 10:04 PM

## 2019-08-28 NOTE — Progress Notes (Signed)
MEDICATION RELATED CONSULT NOTE -   Pharmacy Consult for Decadron PO >> IV Indication: plan for surgery, pt NPO  No Known Allergies  Patient Measurements: Height: 5\' 1"  (154.9 cm) Weight: 60.8 kg (134 lb) IBW/kg (Calculated) : 47.8  Labs: Recent Labs    08/28/19 2044  WBC 16.7*  HGB 14.3  HCT 41.2  PLT 446*  CREATININE 0.61  ALBUMIN 3.4*  PROT 7.1  AST 46*  ALT 68*  ALKPHOS 99  BILITOT 0.6   Medications:  (Not in a hospital admission)  Plan:  Change Decadron to 8mg  IV q12hrs (1:1 conversion) F/U post op to switch back to PO as appropriate  Hart Robinsons A 08/28/2019,10:37 PM

## 2019-08-28 NOTE — ED Triage Notes (Signed)
Pt to triage via w/c with no distress noted; pt reports had portacath placed today; was called by Dr Janese Banks to return to ED regarding MRI results

## 2019-08-28 NOTE — ED Notes (Signed)
Pt given warm blanket at this time 

## 2019-08-28 NOTE — ED Notes (Signed)
Pt given meal tray and drink at this time 

## 2019-08-28 NOTE — Discharge Instructions (Signed)
Moderate Conscious Sedation, Adult, Care After These instructions provide you with information about caring for yourself after your procedure. Your health care provider may also give you more specific instructions. Your treatment has been planned according to current medical practices, but problems sometimes occur. Call your health care provider if you have any problems or questions after your procedure. What can I expect after the procedure? After your procedure, it is common:  To feel sleepy for several hours.  To feel clumsy and have poor balance for several hours.  To have poor judgment for several hours.  To vomit if you eat too soon. Follow these instructions at home: For at least 24 hours after the procedure:   Do not: ? Participate in activities where you could fall or become injured. ? Drive. ? Use heavy machinery. ? Drink alcohol. ? Take sleeping pills or medicines that cause drowsiness. ? Make important decisions or sign legal documents. ? Take care of children on your own.  Rest. Eating and drinking  Follow the diet recommended by your health care provider.  If you vomit: ? Drink water, juice, or soup when you can drink without vomiting. ? Make sure you have little or no nausea before eating solid foods. General instructions  Have a responsible adult stay with you until you are awake and alert.  Take over-the-counter and prescription medicines only as told by your health care provider.  If you smoke, do not smoke without supervision.  Keep all follow-up visits as told by your health care provider. This is important. Contact a health care provider if:  You keep feeling nauseous or you keep vomiting.  You feel light-headed.  You develop a rash.  You have a fever. Get help right away if:  You have trouble breathing. This information is not intended to replace advice given to you by your health care provider. Make sure you discuss any questions you have  with your health care provider. Document Revised: 02/16/2017 Document Reviewed: 06/26/2015 Elsevier Patient Education  2020 [place] dispositivo de perfusin implantable, cuidados posteriores Implanted Sadorus Insertion, Care After [employer] brinda informacin sobre cmo cuidarse despus del procedimiento. El mdico tambin podr darle indicaciones ms especficas. Comunquese con el mdico si tiene problemas o preguntas. Qu puedo esperar despus del procedimiento? Despus del procedimiento, es comn tener los siguientes sntomas:  Chemical engineer de la insercin del dispositivo.  Moretones en la piel alrededor del dispositivo. Esto debera mejorar luego de 3 o 4 das. Siga estas indicaciones en su casa: Cuidado del dispositivo  Luego de que le coloquen el dispositivo, le darn una tarjeta de informacin del fabricante. La tarjeta contiene informacin acerca del dispositivo. Llvela siempre con usted.  Cuide el dispositivo como se lo haya indicado el mdico. Pregntele al mdico si usted o un familiar puede recibir capacitacin para cuidar del dispositivo en casa. Un enfermero a domicilio tambin puede cuidar del dispositivo.  Asegrese de recordar el tipo de dispositivo que tiene. Cuidados de las incisiones      Siga las indicaciones del mdico acerca de los cuidados del lugar de la insercin del dispositivo. Asegrese de hacer lo siguiente: ? Lvese las manos con agua y [place] antes y despus de cambiar la venda (vendaje). Use desinfectante para manos si no dispone de [place] y [place]. ? Cambie el vendaje como se lo haya indicado el mdico. ? No retire los puntos (suturas), la goma para cerrar la piel o las tiras [place]. Es posible  que estos cierres cutneos Animal nutritionist en la piel durante 2semanas o ms tiempo. Si los bordes de las tiras adhesivas empiezan a despegarse y Therapist, sports, puede recortar los que estn sueltos. No retire las tiras [facility] por  completo a menos que el mdico se lo indique.  Controle TEFL teacher de insercin del dispositivo todos los das para detectar signos de infeccin. Est atento a los siguientes signos: ? Enrojecimiento, hinchazn o dolor. ? Lquido o sangre. ? Calor. ? Pus o mal olor. Actividad  Retome sus actividades normales como se lo haya indicado el mdico. Pregntele al mdico qu actividades son seguras para usted.  No levante ningn objeto que pese ms de 10libras (4.5kg) o que supere el lmite de peso que le hayan indicado, hasta que el mdico le diga que puede [place]. Indicaciones generales  [employer] de venta libre y los recetados solamente como se lo haya indicado el mdico.  No tome baos de inmersin, no nade ni use el jacuzzi hasta que el mdico lo autorice. Pregntele al mdico si puede ducharse. Thurston Pounds solo le permitan darse baos de [place].  No conduzca durante 24horas si le administraron un sedante durante el procedimiento.  Use un brazalete de alerta mdico en caso de emergencia. Esto permitir que cualquier mdico que lo atienda sepa que tiene un dispositivo.  Concurra a todas las visitas de seguimiento como se lo haya indicado el mdico. Esto es importante. Comunquese con un mdico si:  No puede purgar el dispositivo con solucin salina como se le indic, o no puede extraer sangre del dispositivo.  Tiene fiebre o escalofros.  Tiene enrojecimiento, hinchazn o dolor alrededor del lugar de la insercin del dispositivo.  Le sale lquido o sangre del Environmental consultant de la insercin del dispositivo.  El lugar de la insercin del dispositivo est caliente al tacto.  Tiene pus o percibe mal olor que proviene del lugar de la insercin del dispositivo. Solicite ayuda inmediatamente si:  Siente falta de aire o dolor en el pecho.  Tiene hemorragia proveniente del lugar donde tiene el dispositivo y no puede controlarla. Resumen  Cuide el dispositivo como se lo haya indicado  el mdico. Tenga la tarjeta informacin del fabricante con usted en todo momento.  Cambie el vendaje como se lo haya indicado el mdico.  Comunquese con un mdico si tiene fiebre o escalofros o si tiene enrojecimiento, hinchazn o dolor alrededor del Environmental consultant de insercin del dispositivo.  Concurra a todas las visitas de seguimiento como se lo haya indicado el mdico. Esta informacin no tiene Marine scientist el consejo del mdico. Asegrese de hacerle al mdico cualquier pregunta que tenga. Document Revised: 11/08/2017 Document Reviewed: 11/08/2017 Elsevier Patient Education  2020 [employer].

## 2019-08-28 NOTE — H&P (Signed)
[facility] History & Physical Update  The patient was interviewed and re-examined.  The patient's previous History and Physical has been reviewed and is unchanged.  There is no change in the plan of care. We plan to proceed with the scheduled procedure.  Leotis Pain, MD  08/28/2019, 9:10 AM

## 2019-08-28 NOTE — Op Note (Signed)
      [facility]       Operative Note  Date: 08/28/2019  Preoperative diagnosis:  1. Breast cancer  Postoperative diagnosis:  Same as above  Procedures: #1. Ultrasound guidance for vascular access to the right internal jugular vein. #2. Fluoroscopic guidance for placement of catheter. #3. Placement of CT compatible Port-A-Cath, right internal jugular vein.  Surgeon: Leotis Pain, MD.   Anesthesia: Local with moderate conscious sedation for approximately 15  minutes using 3 mg of Versed and 75 mcg of Fentanyl  Fluoroscopy time: less than 1 minute  Contrast used: 0  Estimated blood loss: 3 cc  Indication for the procedure:  The patient is a 39 y.o.female with left breast cancer.  The patient needs a Port-A-Cath for durable venous access, chemotherapy, lab draws, and CT scans. We are asked to place this. Risks and benefits were discussed and informed consent was obtained.  Description of procedure: The patient was brought to the vascular and interventional radiology suite.  Moderate conscious sedation was administered throughout the procedure during a face to face encounter with the patient with my supervision of the RN administering medicines and monitoring the patient's vital signs, pulse oximetry, telemetry and mental status throughout from the start of the procedure until the patient was taken to the recovery room. The right neck chest and shoulder were sterilely prepped and draped, and a sterile surgical field was created. Ultrasound was used to help visualize a patent right internal jugular vein. This was then accessed under direct ultrasound guidance without difficulty with the Seldinger needle and a permanent image was recorded. A J-wire was placed. After skin nick and dilatation, the peel-away sheath was then placed over the wire. I then anesthetized an area under the clavicle approximately 1-2 fingerbreadths. A transverse incision was created and an inferior  pocket was created with electrocautery and blunt dissection. The port was then brought onto the field, placed into the pocket and secured to the chest wall with 2 Prolene sutures. The catheter was connected to the port and tunneled from the subclavicular incision to the access site. Fluoroscopic guidance was then used to cut the catheter to an appropriate length. The catheter was then placed through the peel-away sheath and the peel-away sheath was removed. The catheter tip was parked in excellent location under fluorocoscopic guidance in the cavoatrial junction. The pocket was then irrigated with antibiotic impregnated saline and the wound was closed with a running 3-0 Vicryl and a 4-0 Monocryl. The access incision was closed with a single 4-0 Monocryl. The Huber needle was used to withdraw blood and flush the port with heparinized saline. Dermabond was then placed as a dressing. The patient tolerated the procedure well and was taken to the recovery room in stable condition.   Leotis Pain 08/28/2019 10:56 AM   This note was created with Dragon Medical transcription system. Any errors in dictation are purely unintentional.

## 2019-08-28 NOTE — Telephone Encounter (Signed)
Called Addended report   IMPRESSION: 1. 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 Redemonstration of metastatic, pathologic fractures associated with multiple sites of metastatic disease throughout the spine. 2. Sites of concern for future pathologic fracture of the RIGHT sacral ala and RIGHT proximal femur. 3. Pathologic fracture along the anterior aspect of the LEFT iliac bone just above the acetabulum with diffuse iliac and ischial 4. Asymmetric tonsillar/base of tongue activity in the neck is of uncertain significance. Direct clinical inspection or close attention on follow-up is suggested. 5. LEFT breast activity and adenopathy as discussed. 6. Area of activity in the bilateral adnexa favored to represent distal ureters. Attention on follow-up. Correlation could be made with contrasted imaging of the chest, abdomen and pelvis.  Electronically Signed: By: Zetta Bills M.D. On: 08/27/2019 17:23

## 2019-08-28 NOTE — ED Notes (Signed)
First nurse note, pt is a pt of Dr Janese Banks. Advised to come for admit. MRI done on 6/7, pt has fractures and masses to her back. Cancer hx.

## 2019-08-29 ENCOUNTER — Encounter
Admission: EM | Disposition: A | Discharge status: Home Health | Payer: Self-pay | Source: Home / Self Care | Attending: Internal Medicine

## 2019-08-29 ENCOUNTER — Observation Stay: Payer: Self-pay

## 2019-08-29 ENCOUNTER — Observation Stay: Payer: Self-pay | Admitting: Anesthesiology

## 2019-08-29 ENCOUNTER — Encounter: Payer: Self-pay | Admitting: Internal Medicine

## 2019-08-29 ENCOUNTER — Inpatient Hospital Stay: Payer: Self-pay | Admitting: Oncology

## 2019-08-29 DIAGNOSIS — R7401 Elevation of levels of liver transaminase levels: Secondary | ICD-10-CM

## 2019-08-29 DIAGNOSIS — C50919 Malignant neoplasm of unspecified site of unspecified female breast: Secondary | ICD-10-CM | POA: Diagnosis present

## 2019-08-29 DIAGNOSIS — C50912 Malignant neoplasm of unspecified site of left female breast: Secondary | ICD-10-CM

## 2019-08-29 HISTORY — PX: KYPHOPLASTY: SHX5884

## 2019-08-29 LAB — CBC WITH DIFFERENTIAL/PLATELET
Abs Immature Granulocytes: 1.06 10*3/uL — ABNORMAL HIGH (ref 0.00–0.07)
Basophils Absolute: 0.1 10*3/uL (ref 0.0–0.1)
Basophils Relative: 0 %
Eosinophils Absolute: 0 10*3/uL (ref 0.0–0.5)
Eosinophils Relative: 0 %
HCT: 42.7 % (ref 36.0–46.0)
Hemoglobin: 14.6 g/dL (ref 12.0–15.0)
Immature Granulocytes: 4 %
Lymphocytes Relative: 5 %
Lymphs Abs: 1.4 10*3/uL (ref 0.7–4.0)
MCH: 30.1 pg (ref 26.0–34.0)
MCHC: 34.2 g/dL (ref 30.0–36.0)
MCV: 88 fL (ref 80.0–100.0)
Monocytes Absolute: 1.5 10*3/uL — ABNORMAL HIGH (ref 0.1–1.0)
Monocytes Relative: 5 %
Neutro Abs: 23.6 10*3/uL — ABNORMAL HIGH (ref 1.7–7.7)
Neutrophils Relative %: 86 %
Platelets: 420 10*3/uL — ABNORMAL HIGH (ref 150–400)
RBC: 4.85 MIL/uL (ref 3.87–5.11)
RDW: 12.7 % (ref 11.5–15.5)
Smear Review: NORMAL
WBC: 27.6 10*3/uL — ABNORMAL HIGH (ref 4.0–10.5)
nRBC: 0 % (ref 0.0–0.2)

## 2019-08-29 LAB — HIV ANTIBODY (ROUTINE TESTING W REFLEX): HIV Screen 4th Generation wRfx: NONREACTIVE

## 2019-08-29 LAB — SARS CORONAVIRUS 2 BY RT PCR (HOSPITAL ORDER, PERFORMED IN ~~LOC~~ HOSPITAL LAB): SARS Coronavirus 2: NEGATIVE

## 2019-08-29 IMAGING — XA DG C-ARM 1-60 MIN
4 series · 6 of 6 positions shown · non-contrast
Comparison: MRI [DATE]

CLINICAL DATA: Vertebral augmentation.

EXAM:
THORACIC SPINE 2 VIEWS; DG C-ARM 1-60 MIN

[Series 1: dg x-ray · 0.14mm/px · 3 of 3 slices shown]
[im 1/3]
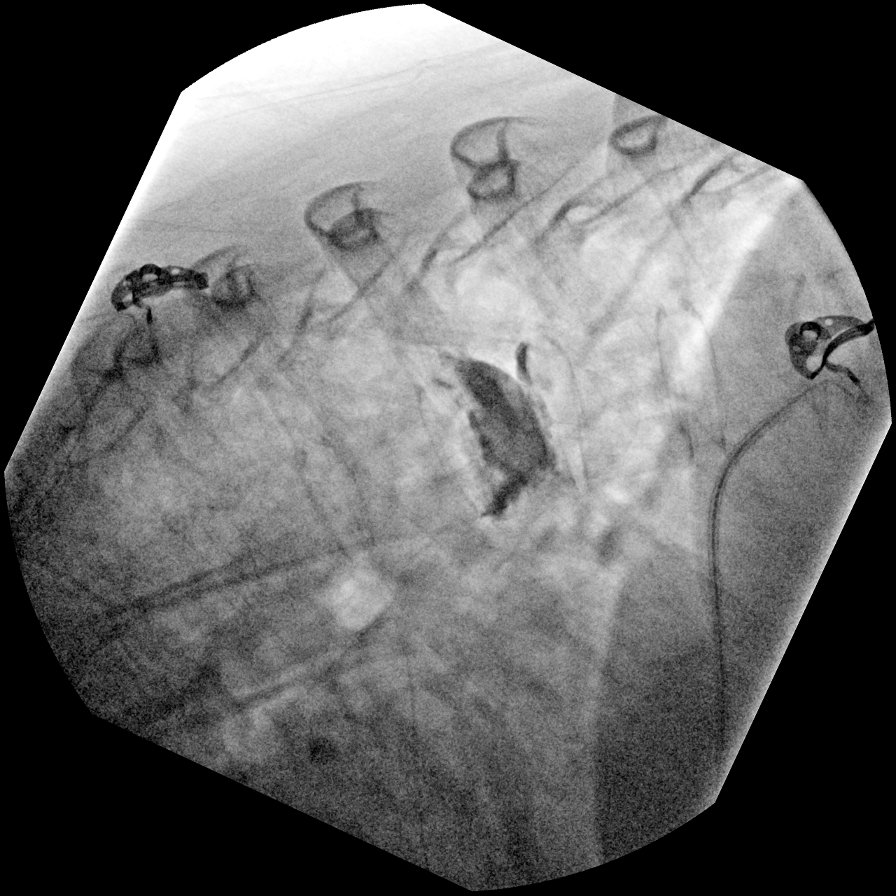
[im 2/3]
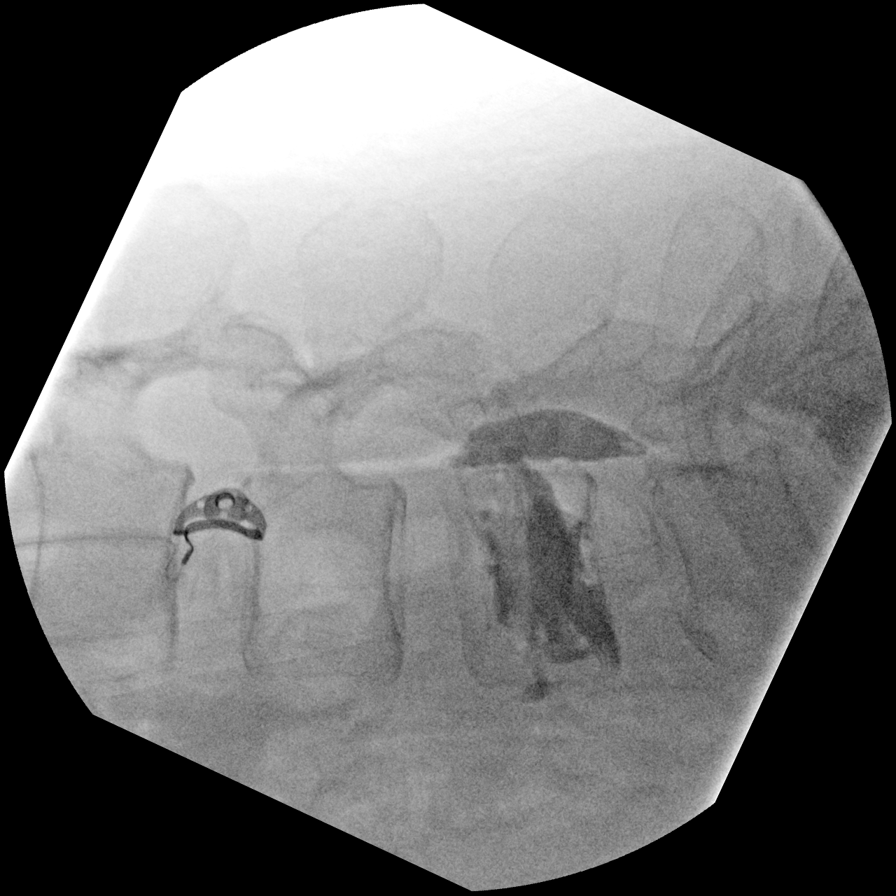
[im 3/3]
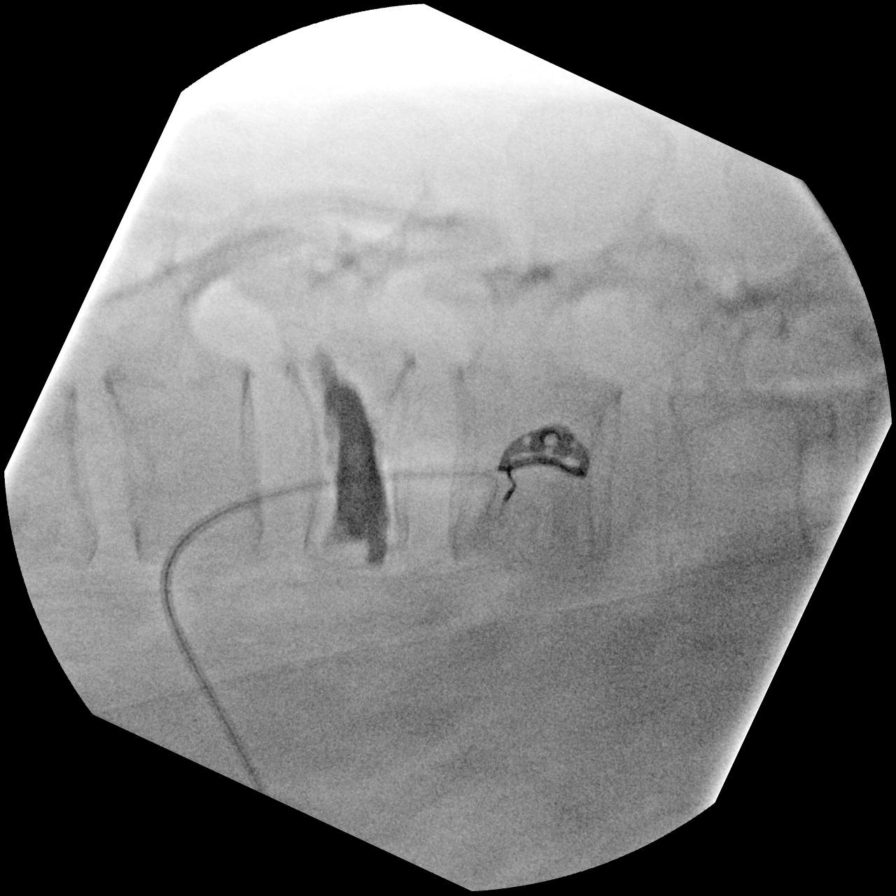

[Series 1: ortho standard · 1 of 1 slices shown (1 of 3)]
[im 1/1]
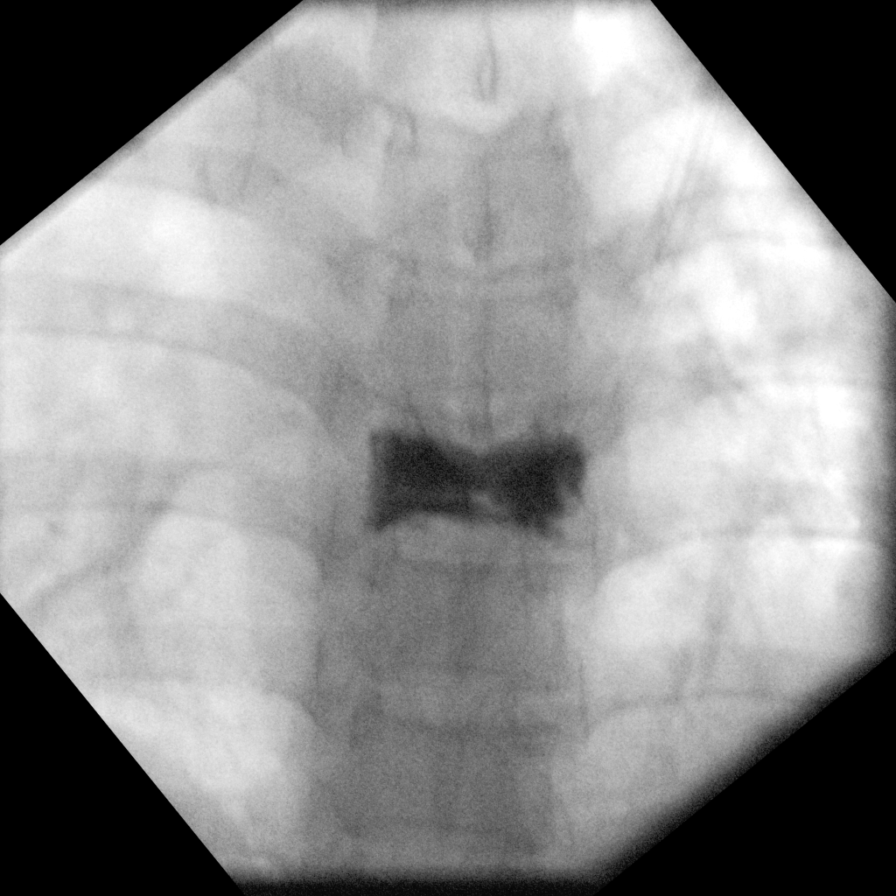

[Series 2: ortho standard · 1 of 1 slices shown (2 of 3)]
[im 1/1]
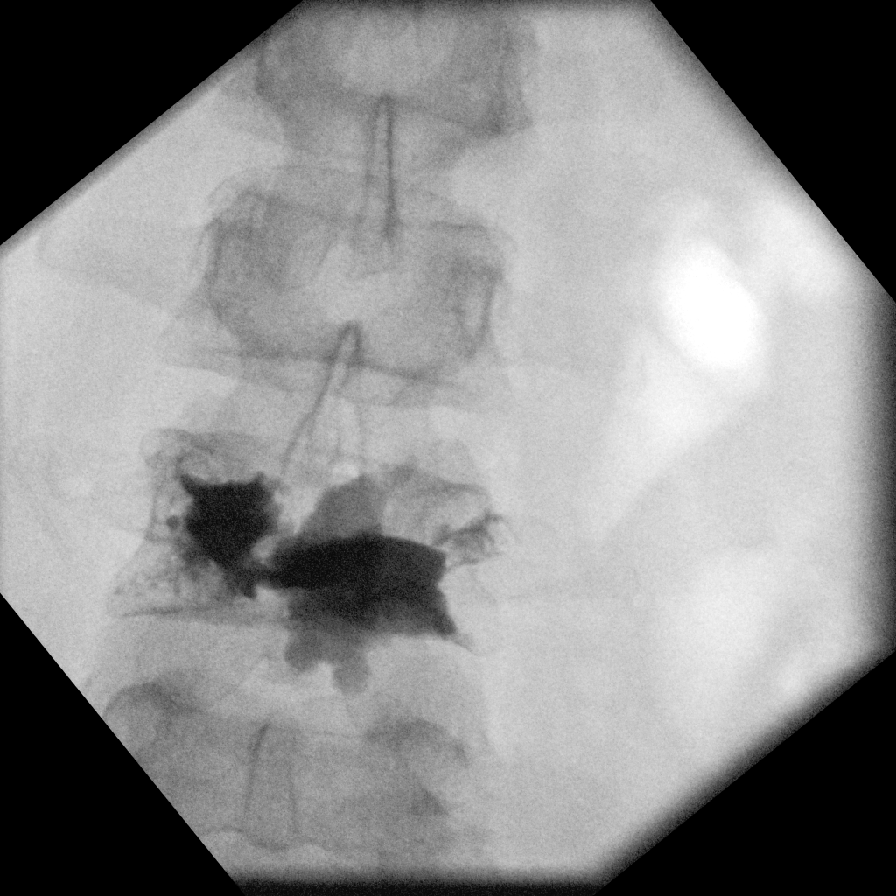

[Series 3: ortho standard · 1 of 1 slices shown (3 of 3)]
[im 1/1]
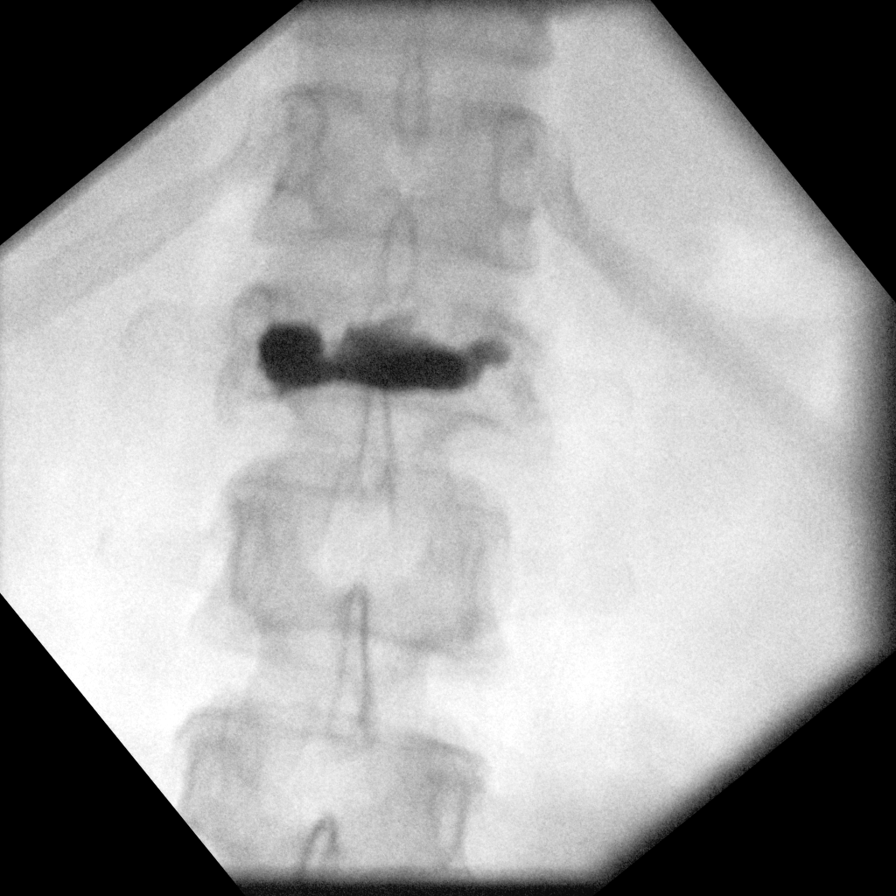

[6 of 6 positions shown; findings below may reference images not displayed]

FINDINGS: Multiple C-arm images are provided. In the lumbar region, vertebral
augmentation at L1 has a good appearance. Vertebral augmentation at
the L4 level shows methylmethacrylate throughout the vertebral body,
and also apparently based on this 1 projection a large amount within
the spinal canal, measuring up to 9 mm in thickness and extending
from the superior aspect of L4 down to the superior aspect of L5.
This could result in compressive stenosis. Thoracic region imaging
shows methylmethacrylate well distributed within the T6 vertebral
body. There is probably some extension into the disc space.
IMPRESSION: Vertebral augmentation at T6, L1 and L4. At L4, there appears to be
ventral epidural cement measuring up to 9 mm in thickness. See
above.

## 2019-08-29 IMAGING — RF DG THORACIC SPINE 2V
4 series · 6 of 6 positions shown · non-contrast
Comparison: MRI [DATE]

CLINICAL DATA: Vertebral augmentation.

EXAM:
THORACIC SPINE 2 VIEWS; DG C-ARM 1-60 MIN

[Series 1: ortho standard · 1 of 1 slices shown (1 of 3)]
[im 1/1]
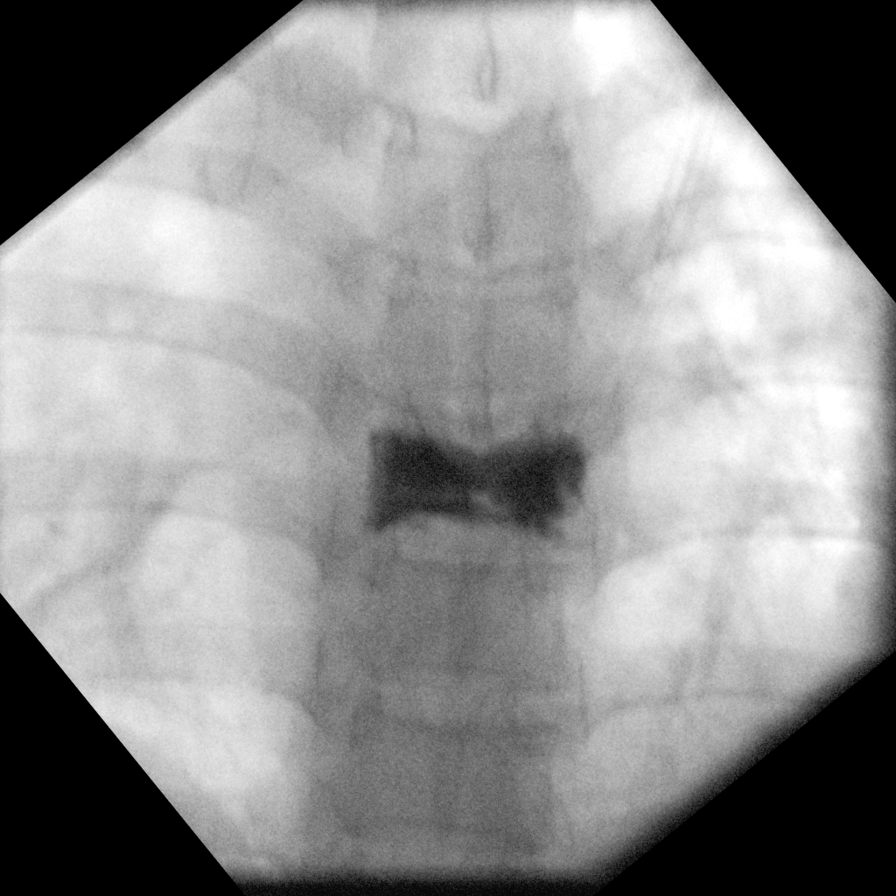

[Series 1: dg x-ray · 0.14mm/px · 3 of 3 slices shown]
[im 1/3]
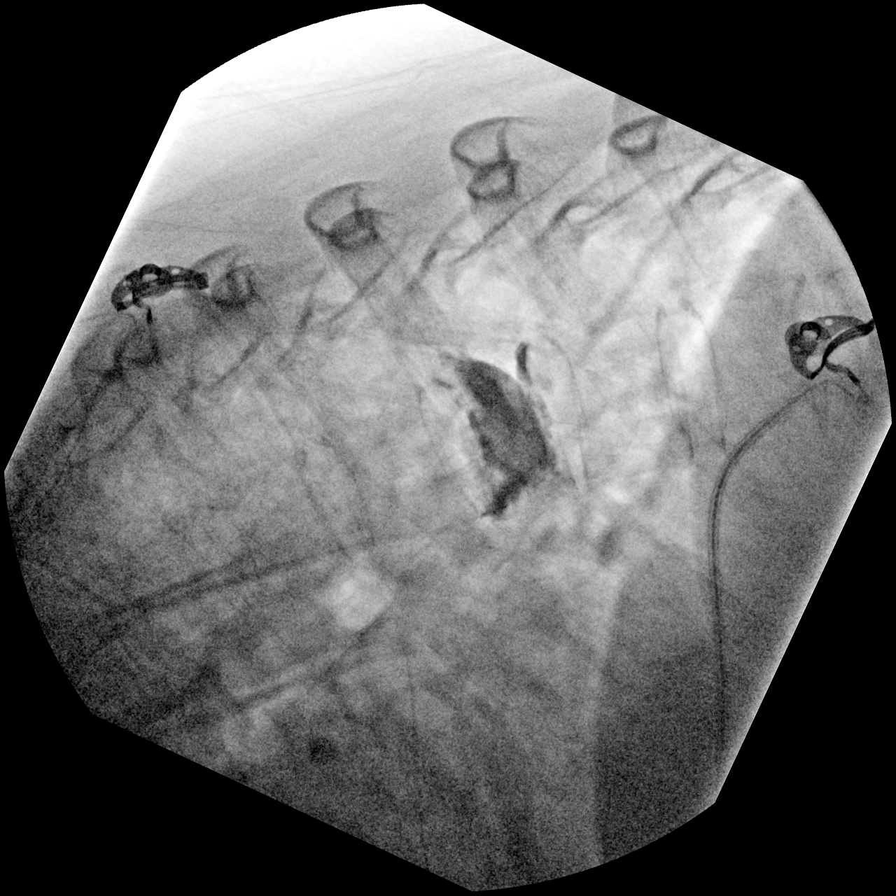
[im 2/3]
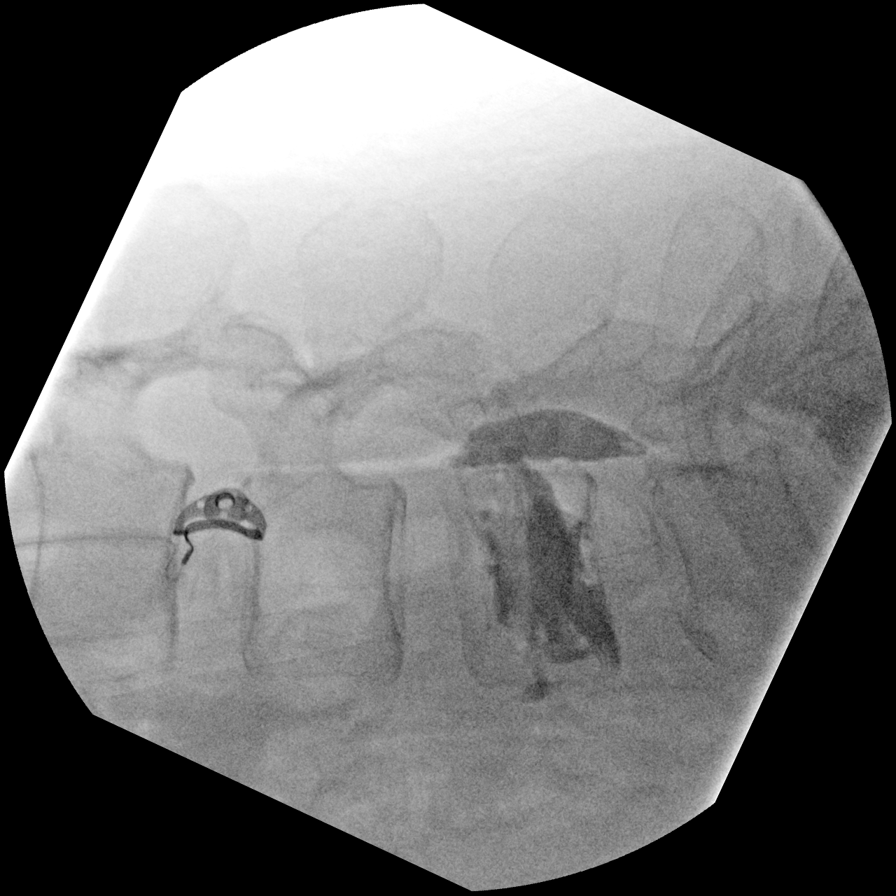
[im 3/3]
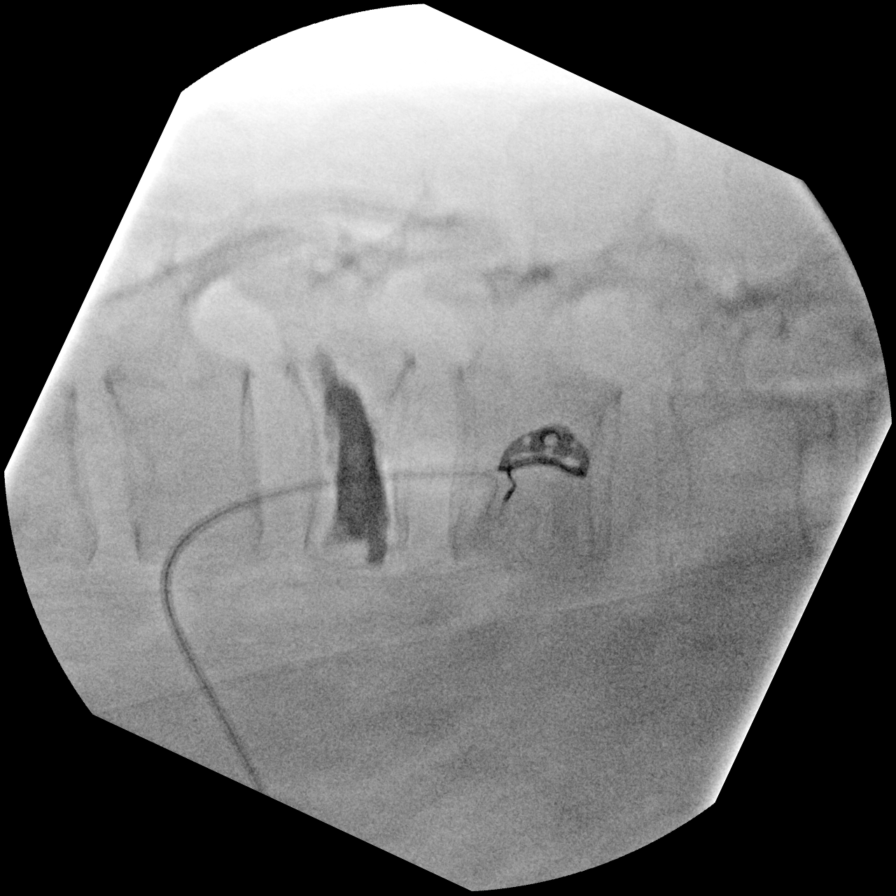

[Series 2: ortho standard · 1 of 1 slices shown (2 of 3)]
[im 1/1]
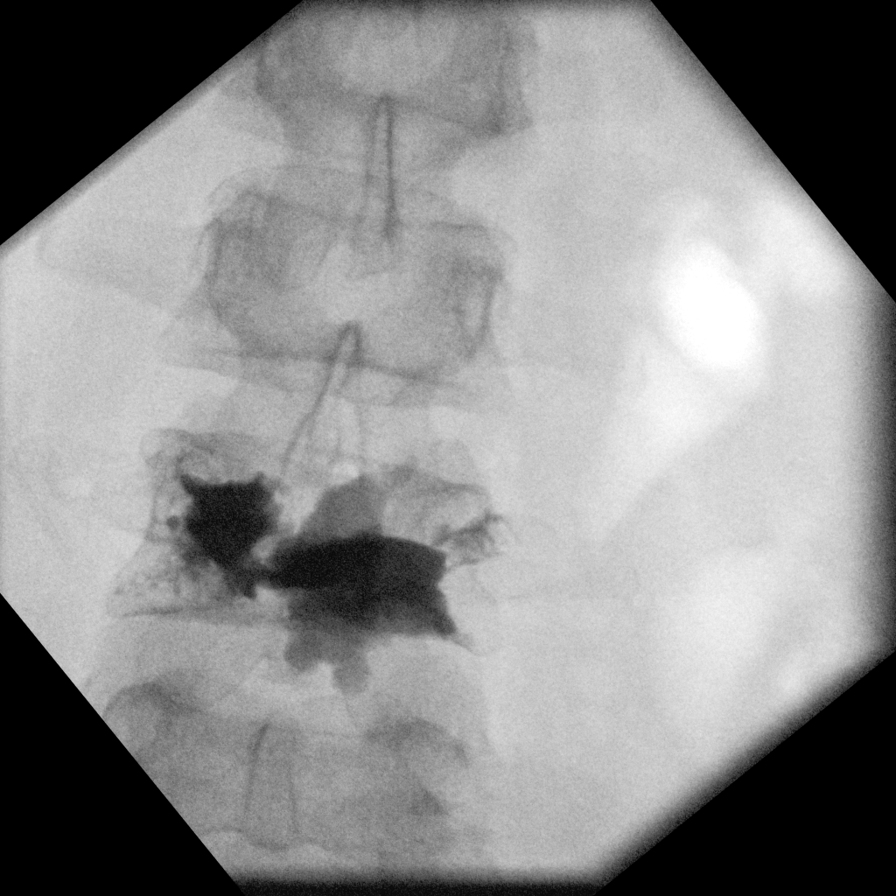

[Series 3: ortho standard · 1 of 1 slices shown (3 of 3)]
[im 1/1]
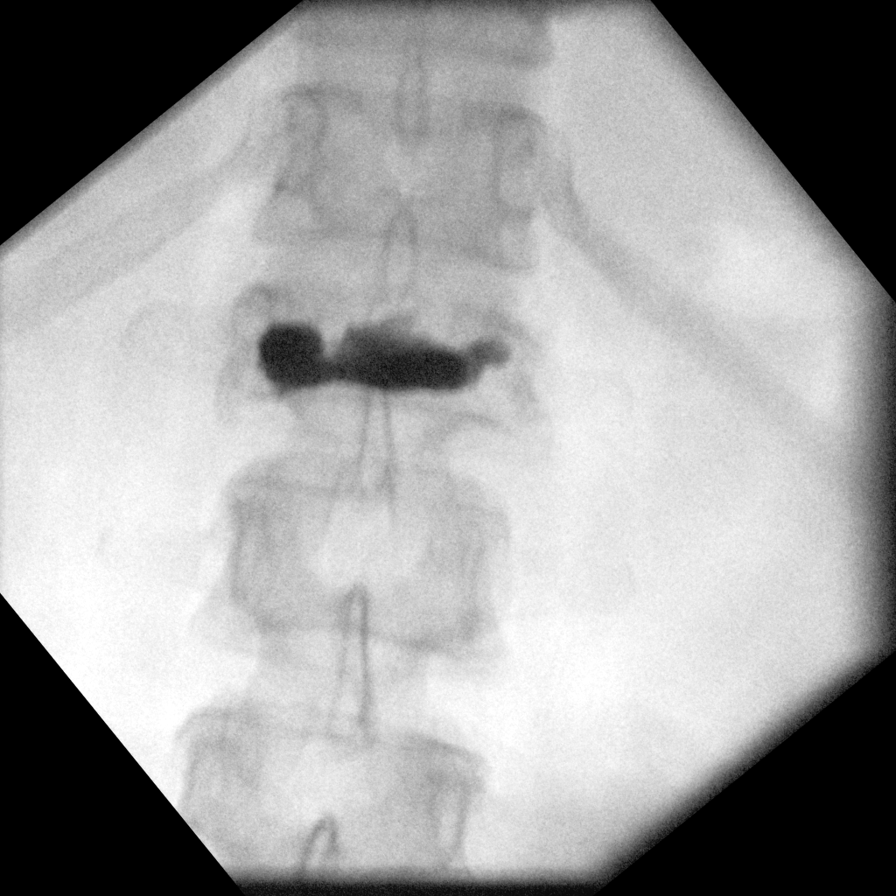

[6 of 6 positions shown; findings below may reference images not displayed]

FINDINGS: Multiple C-arm images are provided. In the lumbar region, vertebral
augmentation at L1 has a good appearance. Vertebral augmentation at
the L4 level shows methylmethacrylate throughout the vertebral body,
and also apparently based on this 1 projection a large amount within
the spinal canal, measuring up to 9 mm in thickness and extending
from the superior aspect of L4 down to the superior aspect of L5.
This could result in compressive stenosis. Thoracic region imaging
shows methylmethacrylate well distributed within the T6 vertebral
body. There is probably some extension into the disc space.
IMPRESSION: Vertebral augmentation at T6, L1 and L4. At L4, there appears to be
ventral epidural cement measuring up to 9 mm in thickness. See
above.

## 2019-08-29 IMAGING — MR MR HEAD WO/W CM
15 series · 46 of 48 positions shown · IV contrast (gadavist)
Comparison: None.

CLINICAL DATA: Metastatic breast cancer.

EXAM:
MRI HEAD WITHOUT AND WITH CONTRAST
TECHNIQUE: Multiplanar, multiecho pulse sequences of the brain and surrounding
structures were obtained without and with intravenous contrast.
CONTRAST:  6mL GADAVIST GADOBUTROL 1 MMOL/ML IV SOLN

[Series 5: ax dwi_tracew · axial · 3.0mm · 0.60mm/px · z∈[-98,+55]mm · 6 of 96 slices shown]
[im 1/96]
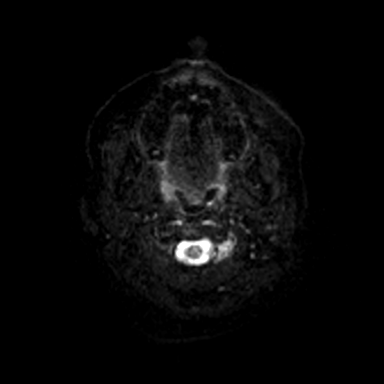
[im 20/96]
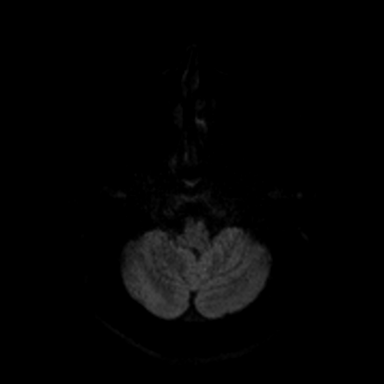
[im 39/96]
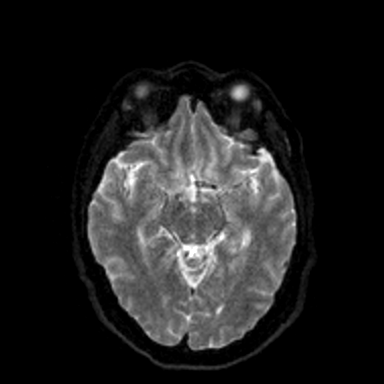
[im 58/96]
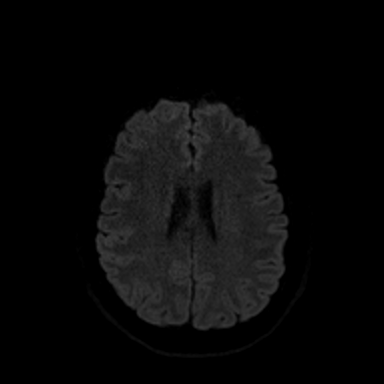
[im 77/96]
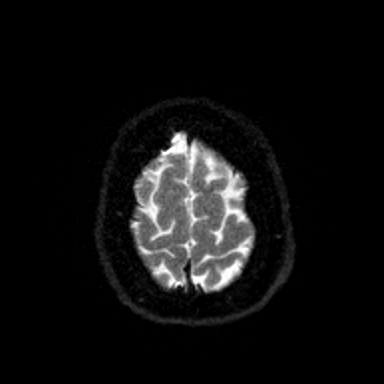
[im 96/96]
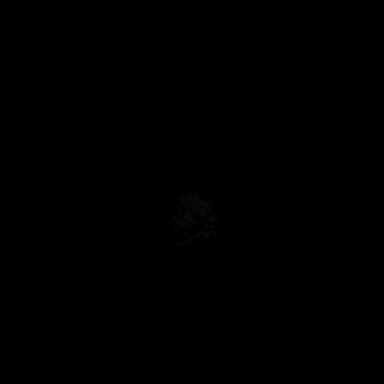

[Series 6: ax dwi_adc · axial · 3.0mm · 0.60mm/px · z∈[-98,+55]mm · 3 of 48 slices shown]
[im 1/48]
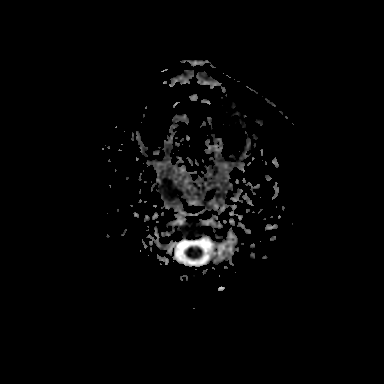
[im 24/48]
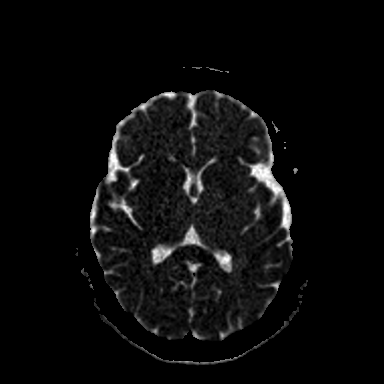
[im 48/48]
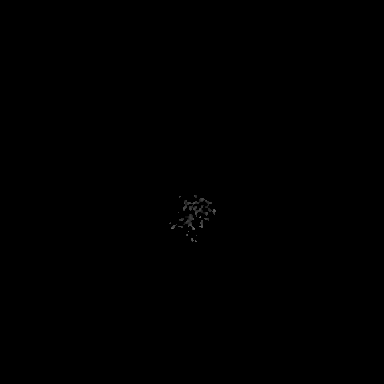

[Series 7: cor dwi_tracew · coronal · 5.0mm · 0.60mm/px · 4 of 80 slices shown]
[im 1/80]
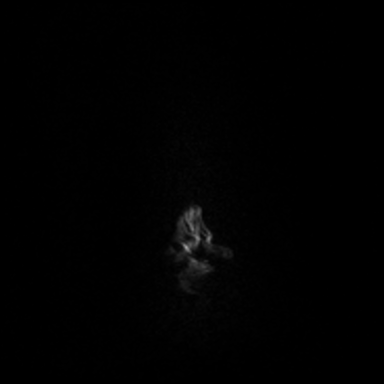
[im 27/80]
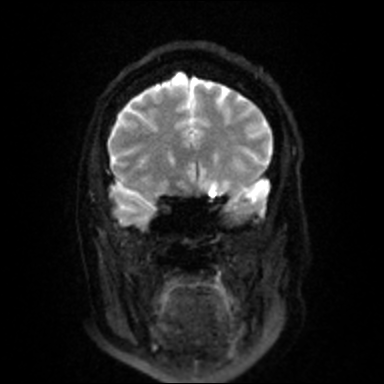
[im 53/80]
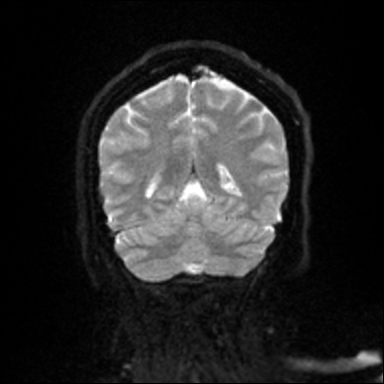
[im 80/80]
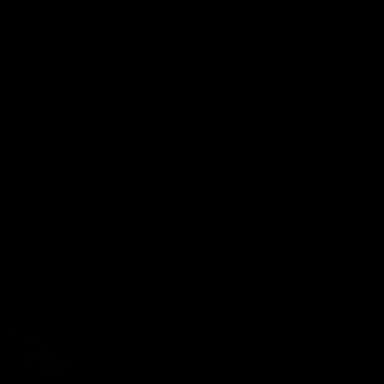

[Series 8: cor dwi_adc · coronal · 5.0mm · 0.60mm/px · 2 of 35 slices shown]
[im 1/35]
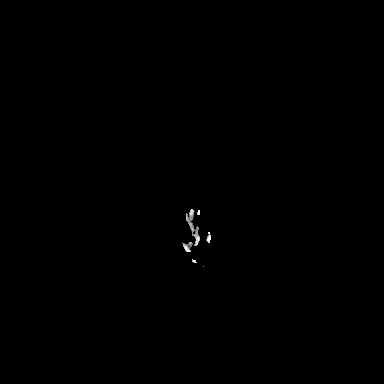
[im 35/35]
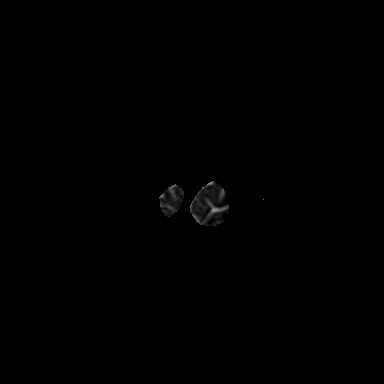

[Series 9: T1 · sagittal · 5.0mm · 0.62mm/px · 1 of 23 slices shown (1 of 2)]
[im 1/23]
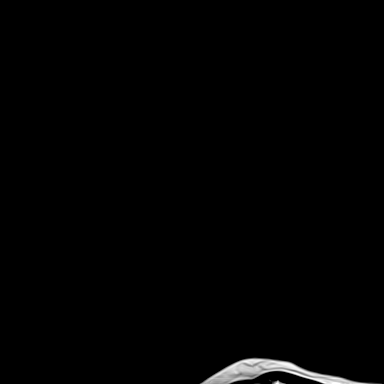

[Series 10: T2 · axial · 5.0mm · 0.53mm/px · 1 of 25 slices shown]
[im 1/25]
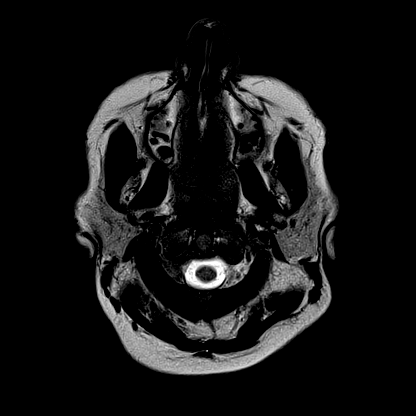

[Series 12: pha_images · axial · 3.0mm · 0.90mm/px · z∈[-106,+67]mm · 3 of 59 slices shown]
[im 1/59]
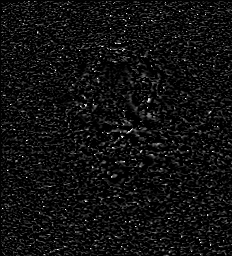
[im 30/59]
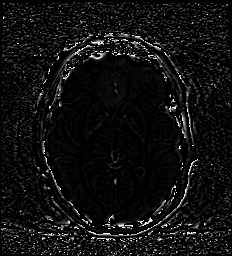
[im 59/59]
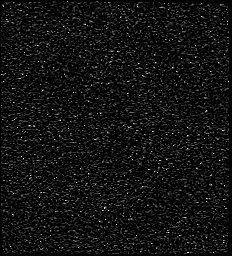

[Series 13: swi_images · axial · 3.0mm · 0.90mm/px · z∈[-106,-21]mm · 2 of 60 slices shown]
[im 1/60]
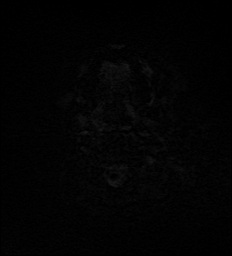
[im 30/60]
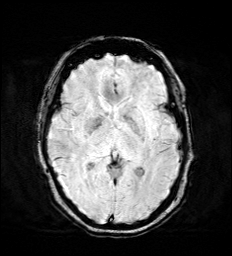

[Series 15: FLAIR · axial · 3.0mm · 0.53mm/px · z∈[-98,+60]mm · 3 of 55 slices shown]
[im 1/55]
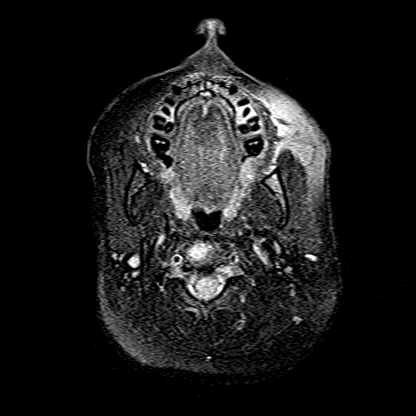
[im 28/55]
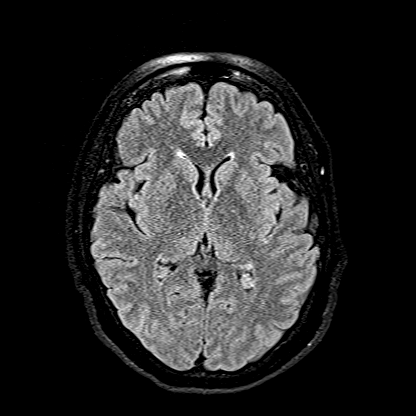
[im 55/55]
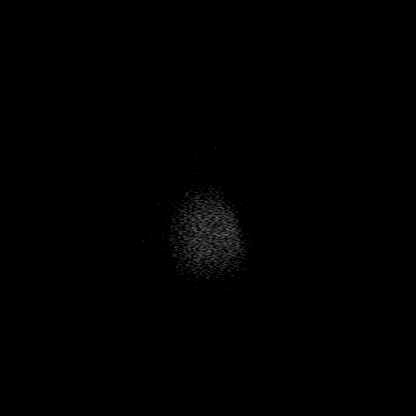

[Series 16: T1 · axial · 1.0mm · 0.98mm/px · z∈[-102,+69]mm · 8 of 176 slices shown (2 of 2)]
[im 1/176]
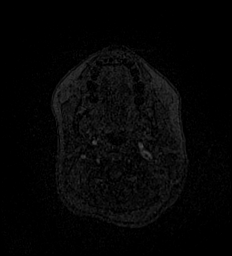
[im 22/176]
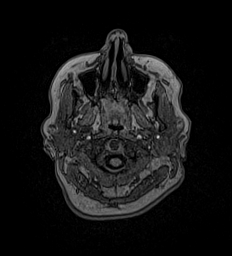
[im 44/176]
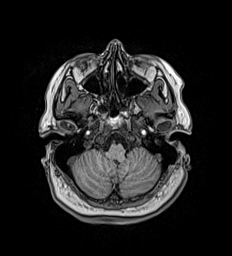
[im 66/176]
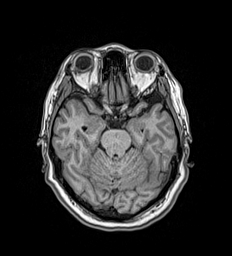
[im 110/176]
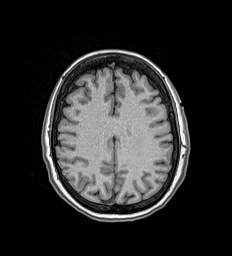
[im 132/176]
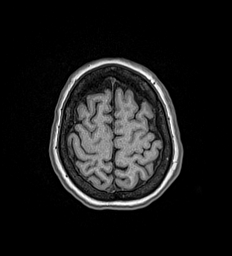
[im 154/176]
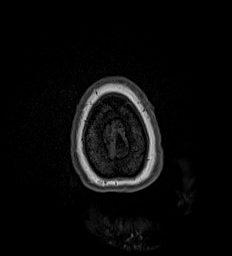
[im 176/176]
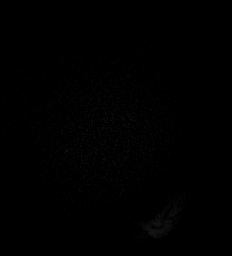

[Series 17: T2 post-contrast · coronal · 5.0mm · 0.57mm/px · 1 of 29 slices shown (1 of 2)]
[im 1/29]
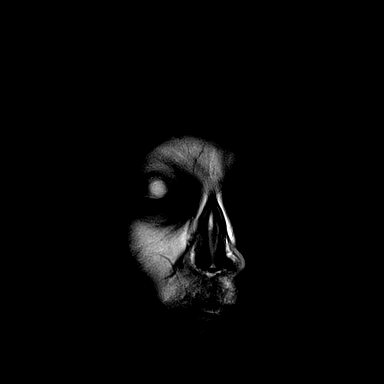

[Series 18: T1 post-contrast · axial · 1.0mm · 0.98mm/px · z∈[-102,+69]mm · 9 of 176 slices shown (1 of 3)]
[im 1/176]
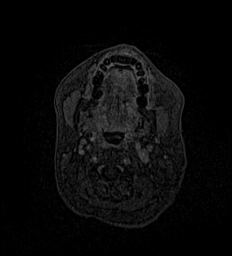
[im 22/176]
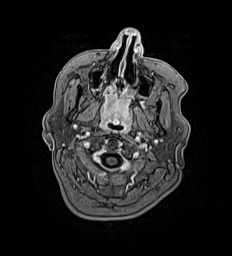
[im 44/176]
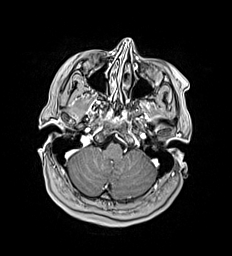
[im 66/176]
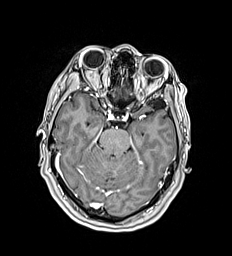
[im 88/176]
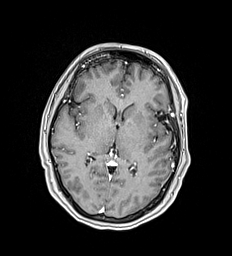
[im 110/176]
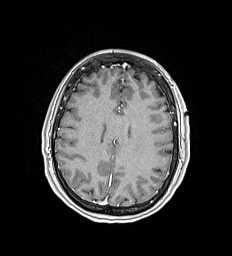
[im 132/176]
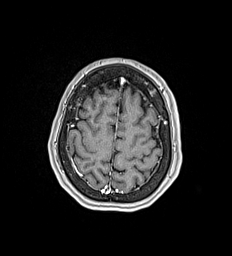
[im 154/176]
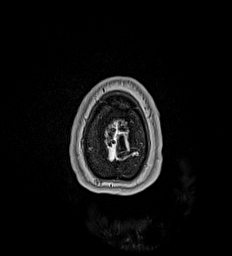
[im 176/176]
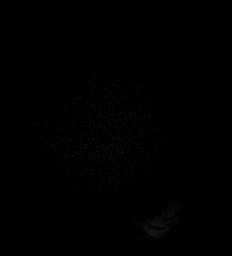

[Series 19: T1 post-contrast · coronal · 5.0mm · 0.57mm/px · 1 of 29 slices shown (2 of 3)]
[im 1/29]
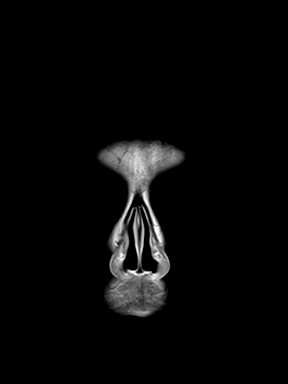

[Series 20: T1 post-contrast · sagittal · 5.0mm · 0.62mm/px · 1 of 23 slices shown (3 of 3)]
[im 1/23]
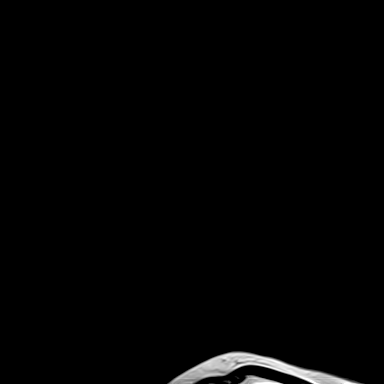

[Series 21: T2 post-contrast · coronal · 5.0mm · 0.57mm/px · 1 of 29 slices shown (2 of 2)]
[im 1/29]
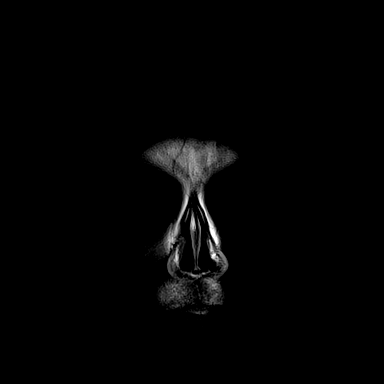

[46 of 48 positions shown; findings below may reference images not displayed]

FINDINGS: Brain: There is no acute infarct, acute hemorrhage or extra-axial
collection. Normal white matter signal. CSF spaces are normal. No
contrast-enhancing lesions. No chronic microhemorrhage. Midline
structures are normal.

Vascular: Normal flow voids

Skull and upper cervical spine: Normal bone marrow signal.

Sinuses/Orbits: Paranasal sinuses and mastoids are clear. Normal
orbits.

Other: None
IMPRESSION: Normal brain MRI. No evidence of intracranial metastatic disease.

## 2019-08-29 IMAGING — XA DG C-ARM 1-60 MIN
4 series · 6 of 6 positions shown · non-contrast
Comparison: MRI [DATE]

CLINICAL DATA: Vertebral augmentation.

EXAM:
THORACIC SPINE 2 VIEWS; DG C-ARM 1-60 MIN

[Series 1: ortho standard · 1 of 1 slices shown (1 of 3)]
[im 1/1]
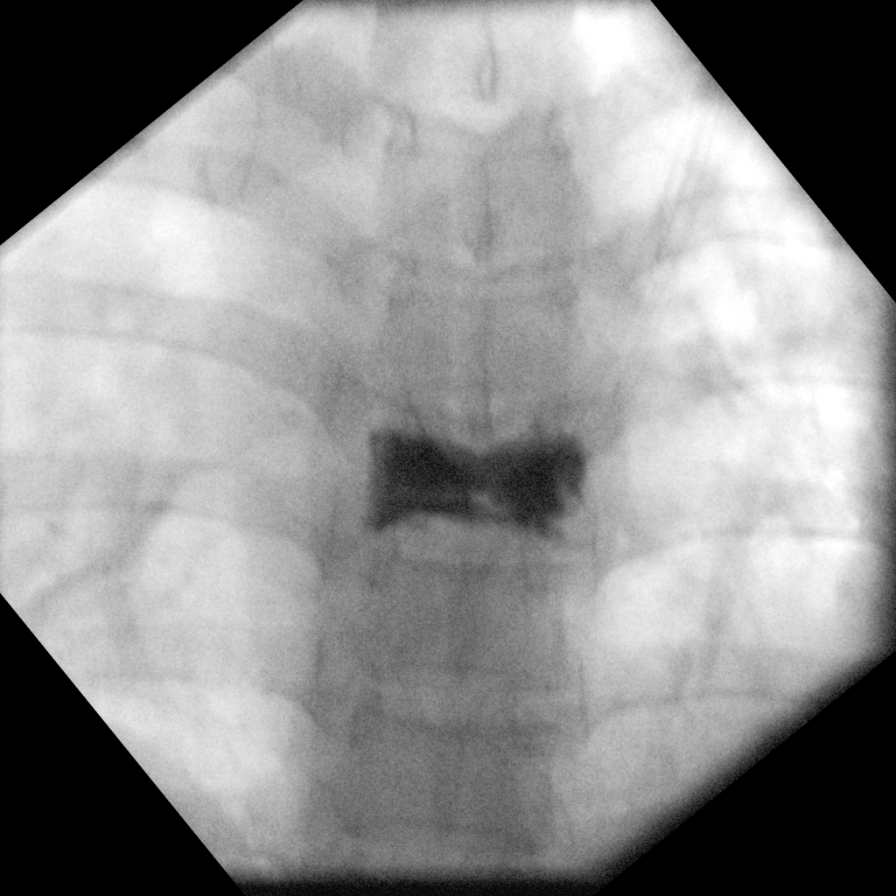

[Series 1: dg x-ray · 0.14mm/px · 3 of 3 slices shown]
[im 1/3]
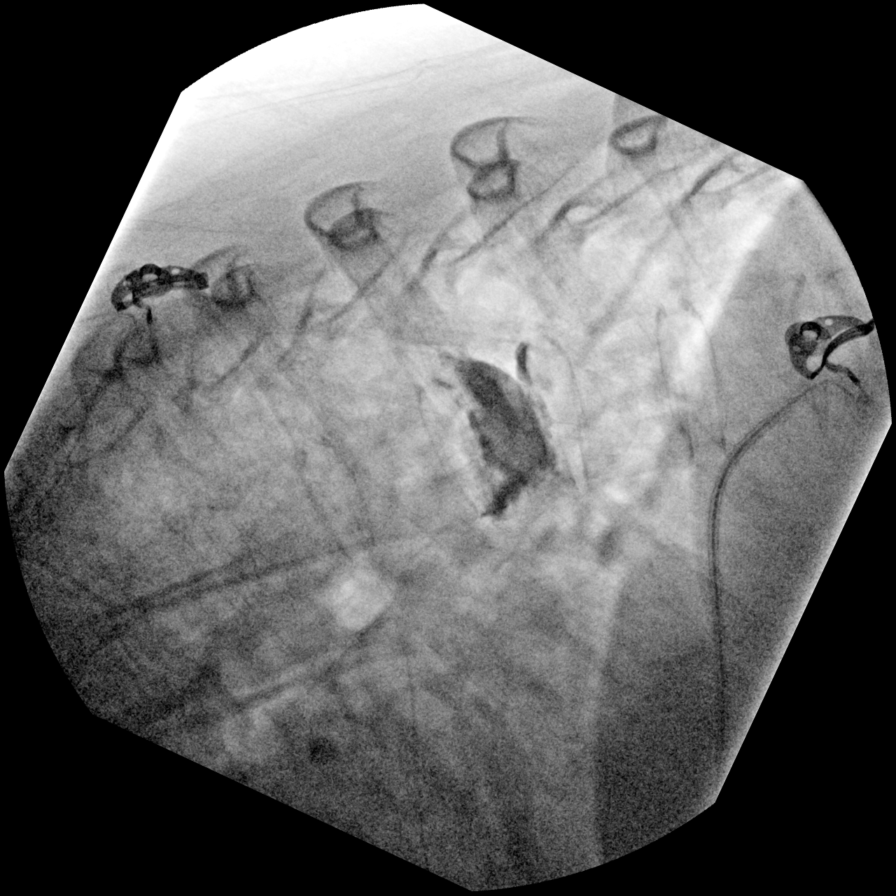
[im 2/3]
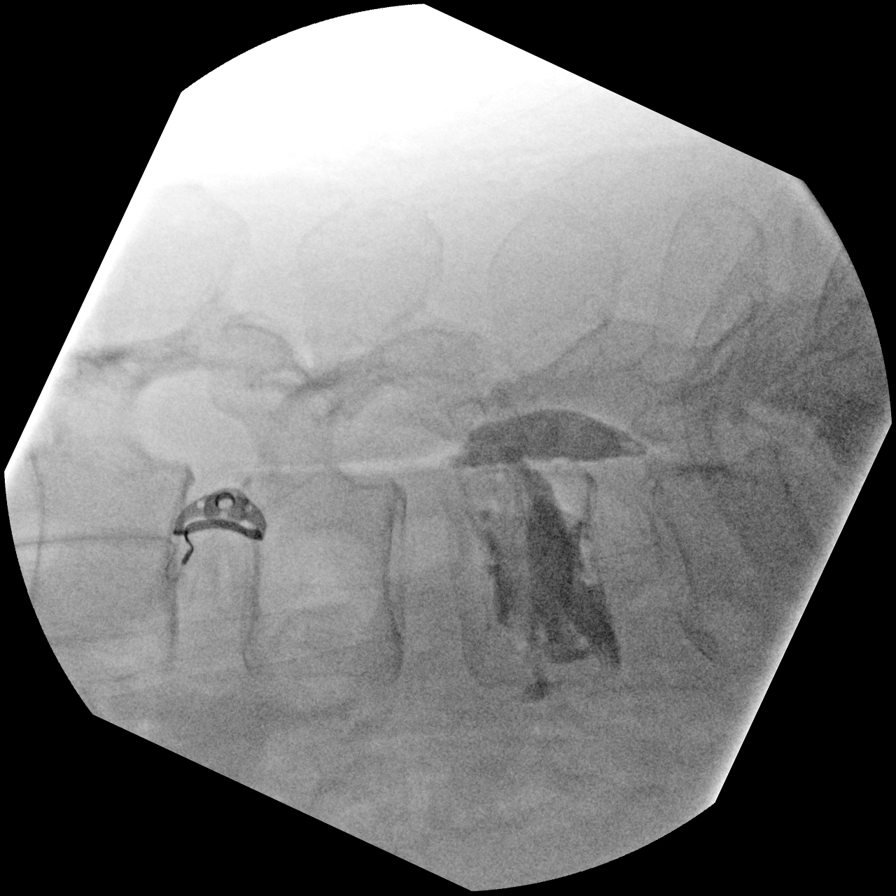
[im 3/3]
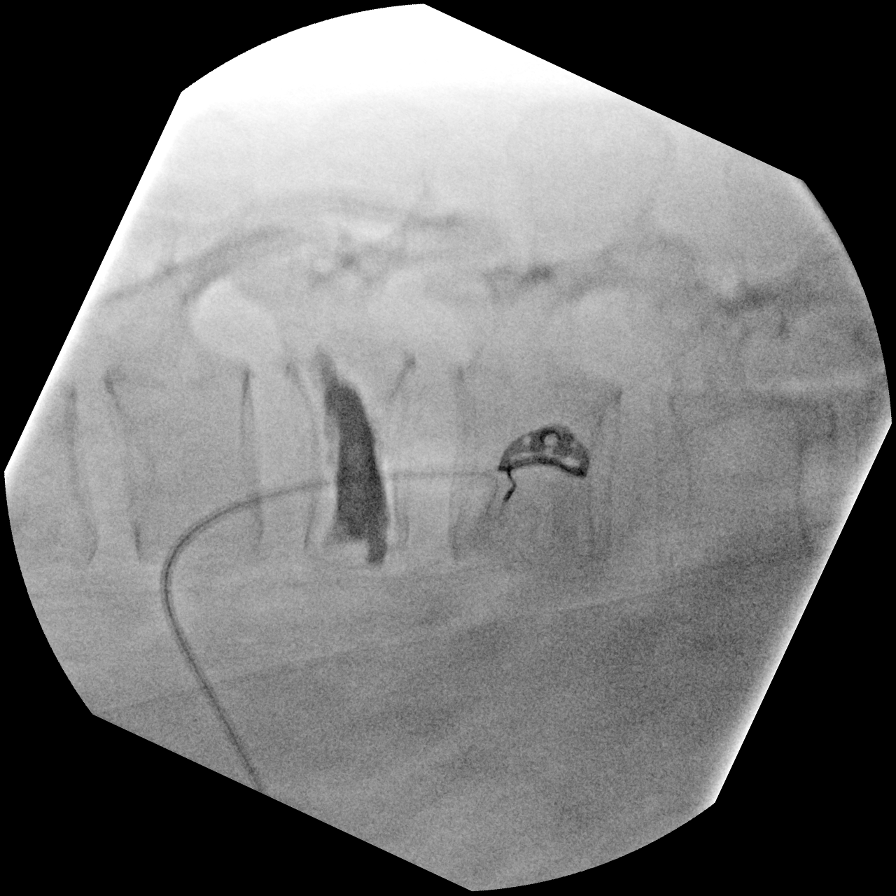

[Series 2: ortho standard · 1 of 1 slices shown (2 of 3)]
[im 1/1]
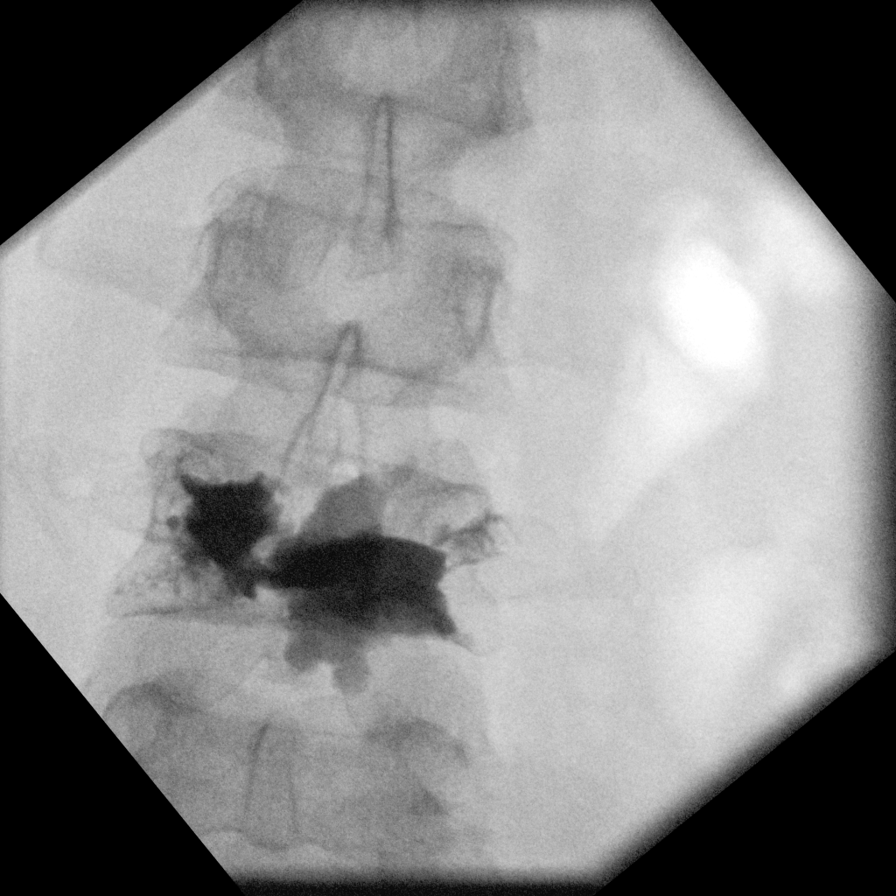

[Series 3: ortho standard · 1 of 1 slices shown (3 of 3)]
[im 1/1]
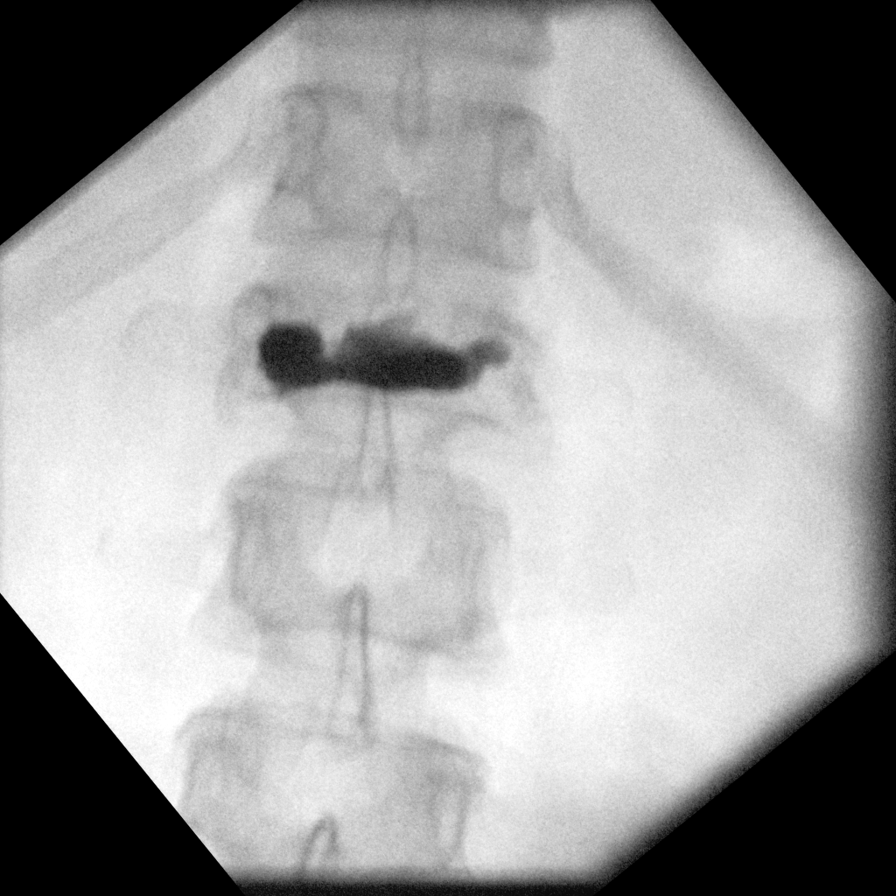

[6 of 6 positions shown; findings below may reference images not displayed]

FINDINGS: Multiple C-arm images are provided. In the lumbar region, vertebral
augmentation at L1 has a good appearance. Vertebral augmentation at
the L4 level shows methylmethacrylate throughout the vertebral body,
and also apparently based on this 1 projection a large amount within
the spinal canal, measuring up to 9 mm in thickness and extending
from the superior aspect of L4 down to the superior aspect of L5.
This could result in compressive stenosis. Thoracic region imaging
shows methylmethacrylate well distributed within the T6 vertebral
body. There is probably some extension into the disc space.
IMPRESSION: Vertebral augmentation at T6, L1 and L4. At L4, there appears to be
ventral epidural cement measuring up to 9 mm in thickness. See
above.

## 2019-08-29 SURGERY — KYPHOPLASTY
Anesthesia: General

## 2019-08-29 MED ORDER — NAPHAZOLINE-PHENIRAMINE 0.025-0.3 % OP SOLN
1.0000 [drp] | Freq: Four times a day (QID) | OPHTHALMIC | Status: DC | PRN
  Filled 2019-08-29: qty 5

## 2019-08-29 MED ORDER — OXYCODONE HCL 5 MG PO TABS: 5.0000 mg | ORAL_TABLET | Freq: Once | ORAL | Status: DC | PRN

## 2019-08-29 MED ORDER — LIDOCAINE HCL 1 % IJ SOLN
INTRAMUSCULAR | Status: DC | PRN
  Administered 2019-08-29: 65 mL

## 2019-08-29 MED ORDER — DIPHENHYDRAMINE HCL 50 MG/ML IJ SOLN
25.0000 mg | Freq: Four times a day (QID) | INTRAMUSCULAR | Status: DC | PRN
  Administered 2019-08-29: 25 mg via INTRAVENOUS

## 2019-08-29 MED ORDER — MIDAZOLAM HCL 2 MG/2ML IJ SOLN
INTRAMUSCULAR | Status: DC | PRN
  Administered 2019-08-29: 2 mg via INTRAVENOUS

## 2019-08-29 MED ORDER — FENTANYL CITRATE (PF) 100 MCG/2ML IJ SOLN
INTRAMUSCULAR | Status: AC
  Filled 2019-08-29: qty 2

## 2019-08-29 MED ORDER — LIDOCAINE HCL URETHRAL/MUCOSAL 2 % EX GEL
CUTANEOUS | Status: AC
  Filled 2019-08-29: qty 5

## 2019-08-29 MED ORDER — PROPOFOL 500 MG/50ML IV EMUL
INTRAVENOUS | Status: AC
  Filled 2019-08-29: qty 50

## 2019-08-29 MED ORDER — LORAZEPAM 0.5 MG PO TABS
0.5000 mg | ORAL_TABLET | Freq: Four times a day (QID) | ORAL | Status: DC | PRN
  Administered 2019-09-03: 22:00:00 0.5 mg via ORAL
  Filled 2019-08-29: qty 1

## 2019-08-29 MED ORDER — OXYCODONE HCL 5 MG/5ML PO SOLN: 5.0000 mg | Freq: Once | ORAL | Status: DC | PRN

## 2019-08-29 MED ORDER — FENTANYL CITRATE (PF) 100 MCG/2ML IJ SOLN
INTRAMUSCULAR | Status: AC
  Administered 2019-08-29: 25 ug via INTRAVENOUS
  Filled 2019-08-29: qty 2

## 2019-08-29 MED ORDER — MIDAZOLAM HCL 2 MG/2ML IJ SOLN
INTRAMUSCULAR | Status: AC
  Filled 2019-08-29: qty 2

## 2019-08-29 MED ORDER — IOHEXOL 180 MG/ML  SOLN
INTRAMUSCULAR | Status: DC | PRN
  Administered 2019-08-29: 80 mL

## 2019-08-29 MED ORDER — PROPOFOL 10 MG/ML IV BOLUS
INTRAVENOUS | Status: DC | PRN
  Administered 2019-08-29: 50 mg via INTRAVENOUS

## 2019-08-29 MED ORDER — LIDOCAINE HCL (PF) 2 % IJ SOLN
INTRAMUSCULAR | Status: AC
  Filled 2019-08-29: qty 5

## 2019-08-29 MED ORDER — GADOBUTROL 1 MMOL/ML IV SOLN
6.0000 mL | Freq: Once | INTRAVENOUS | Status: AC | PRN
  Administered 2019-08-29: 6 mL via INTRAVENOUS

## 2019-08-29 MED ORDER — CEFAZOLIN SODIUM-DEXTROSE 1-4 GM/50ML-% IV SOLN
INTRAVENOUS | Status: AC
  Filled 2019-08-29: qty 50

## 2019-08-29 MED ORDER — LIDOCAINE HCL (PF) 1 % IJ SOLN
INTRAMUSCULAR | Status: AC
  Filled 2019-08-29: qty 30

## 2019-08-29 MED ORDER — PROPOFOL 10 MG/ML IV BOLUS
INTRAVENOUS | Status: AC
  Filled 2019-08-29: qty 20

## 2019-08-29 MED ORDER — ONDANSETRON HCL 4 MG/2ML IJ SOLN
INTRAMUSCULAR | Status: DC | PRN
  Administered 2019-08-29: 4 mg via INTRAVENOUS

## 2019-08-29 MED ORDER — DIPHENHYDRAMINE HCL 50 MG/ML IJ SOLN
INTRAMUSCULAR | Status: AC
  Filled 2019-08-29: qty 1

## 2019-08-29 MED ORDER — PROPOFOL 500 MG/50ML IV EMUL
INTRAVENOUS | Status: DC | PRN
  Administered 2019-08-29: 100 ug/kg/min via INTRAVENOUS

## 2019-08-29 MED ORDER — DEXMEDETOMIDINE HCL 200 MCG/2ML IV SOLN
INTRAVENOUS | Status: DC | PRN
  Administered 2019-08-29 (×5): 4 ug via INTRAVENOUS

## 2019-08-29 MED ORDER — CHLORHEXIDINE GLUCONATE CLOTH 2 % EX PADS
6.0000 | MEDICATED_PAD | Freq: Every day | CUTANEOUS | Status: DC
  Administered 2019-08-29 – 2019-09-03 (×5): 6 via TOPICAL

## 2019-08-29 MED ORDER — FENTANYL CITRATE (PF) 100 MCG/2ML IJ SOLN
25.0000 ug | INTRAMUSCULAR | Status: AC | PRN
  Administered 2019-08-29 (×4): 25 ug via INTRAVENOUS

## 2019-08-29 MED ORDER — LIDOCAINE HCL (CARDIAC) PF 100 MG/5ML IV SOSY
PREFILLED_SYRINGE | INTRAVENOUS | Status: DC | PRN
  Administered 2019-08-29: 50 mg via INTRATRACHEAL

## 2019-08-29 MED ORDER — DEXMEDETOMIDINE HCL IN NACL 80 MCG/20ML IV SOLN
INTRAVENOUS | Status: AC
  Filled 2019-08-29: qty 20

## 2019-08-29 MED ORDER — BUPIVACAINE-EPINEPHRINE (PF) 0.5% -1:200000 IJ SOLN
INTRAMUSCULAR | Status: AC
  Filled 2019-08-29: qty 30

## 2019-08-29 MED ORDER — BUPIVACAINE HCL (PF) 0.5 % IJ SOLN
INTRAMUSCULAR | Status: AC
  Filled 2019-08-29: qty 30

## 2019-08-29 MED ORDER — OXYCODONE HCL 5 MG PO TABS
5.0000 mg | ORAL_TABLET | ORAL | Status: DC | PRN
  Administered 2019-08-29 – 2019-09-02 (×6): 5 mg via ORAL
  Filled 2019-08-29 (×7): qty 1

## 2019-08-29 MED ORDER — CEFAZOLIN SODIUM-DEXTROSE 1-4 GM/50ML-% IV SOLN
1.0000 g | Freq: Once | INTRAVENOUS | Status: AC
  Administered 2019-08-29: 1 g via INTRAVENOUS
  Filled 2019-08-29: qty 50

## 2019-08-29 MED ORDER — BUPIVACAINE-EPINEPHRINE (PF) 0.5% -1:200000 IJ SOLN
INTRAMUSCULAR | Status: DC | PRN
  Administered 2019-08-29: 55 mL

## 2019-08-29 MED ORDER — CYCLOBENZAPRINE HCL 10 MG PO TABS
10.0000 mg | ORAL_TABLET | Freq: Three times a day (TID) | ORAL | Status: DC | PRN
  Administered 2019-08-29 (×2): 10 mg via ORAL
  Filled 2019-08-29 (×3): qty 1

## 2019-08-29 MED ORDER — LIDOCAINE HCL (PF) 1 % IJ SOLN
INTRAMUSCULAR | Status: AC
  Filled 2019-08-29: qty 60

## 2019-08-29 MED ORDER — FENTANYL CITRATE (PF) 100 MCG/2ML IJ SOLN
INTRAMUSCULAR | Status: DC | PRN
  Administered 2019-08-29 (×4): 25 ug via INTRAVENOUS

## 2019-08-29 MED ORDER — MECLIZINE HCL 25 MG PO TABS
25.0000 mg | ORAL_TABLET | Freq: Three times a day (TID) | ORAL | Status: DC | PRN
  Filled 2019-08-29: qty 1

## 2019-08-29 MED ORDER — ENOXAPARIN SODIUM 40 MG/0.4ML ~~LOC~~ SOLN
40.0000 mg | SUBCUTANEOUS | Status: DC
  Administered 2019-08-30 – 2019-08-31 (×2): 40 mg via SUBCUTANEOUS
  Filled 2019-08-29 (×2): qty 0.4

## 2019-08-29 MED ORDER — ONDANSETRON HCL 4 MG/2ML IJ SOLN
INTRAMUSCULAR | Status: AC
  Filled 2019-08-29: qty 2

## 2019-08-29 SURGICAL SUPPLY — 27 items
CEMENT KYPHON CX01A KIT/MIXER (Cement) ×4 IMPLANT
COVER WAND RF STERILE (DRAPES) ×2 IMPLANT
DERMABOND ADVANCED (GAUZE/BANDAGES/DRESSINGS) ×1
DERMABOND ADVANCED .7 DNX12 (GAUZE/BANDAGES/DRESSINGS) ×1 IMPLANT
DEVICE BIOPSY BONE KYPHX (INSTRUMENTS) ×2 IMPLANT
DRAPE C-ARM XRAY 36X54 (DRAPES) ×2 IMPLANT
DURAPREP 26ML APPLICATOR (WOUND CARE) ×2 IMPLANT
GLOVE SURG SYN 9.0  PF PI (GLOVE) ×1
GLOVE SURG SYN 9.0 PF PI (GLOVE) ×1 IMPLANT
GOWN SRG 2XL LVL 4 RGLN SLV (GOWNS) ×1 IMPLANT
GOWN STRL NON-REIN 2XL LVL4 (GOWNS) ×1
GOWN STRL REUS W/ TWL LRG LVL3 (GOWN DISPOSABLE) ×1 IMPLANT
GOWN STRL REUS W/TWL LRG LVL3 (GOWN DISPOSABLE) ×1
KIT OSTEOCOOL BONE ACCESS 10G (MISCELLANEOUS) ×4 IMPLANT
KIT OSTEOCOOL BONE ACCESS 8G (MISCELLANEOUS) ×8 IMPLANT
KIT OSTEOCOOL DUAL PROBE 10MM (MISCELLANEOUS) ×2 IMPLANT
KIT OSTEOCOOL DUAL PROBE 15 (MISCELLANEOUS) ×2 IMPLANT
PACK KYPHOPLASTY (MISCELLANEOUS) ×2 IMPLANT
RENTAL RFA GENERATOR (MISCELLANEOUS) ×2 IMPLANT
STRAP SAFETY 5IN WIDE (MISCELLANEOUS) ×2 IMPLANT
SWABSTK COMLB BENZOIN TINCTURE (MISCELLANEOUS) ×2 IMPLANT
SYS CARTRIDGE BONE CEMENT 8ML (SYSTAGENIX WOUND MANAGEMENT) ×2
SYSTEM CARTRIDG BONE CEMNT 8ML (SYSTAGENIX WOUND MANAGEMENT) ×1 IMPLANT
SYSTEM GUN BONE FILLER SZ2 (MISCELLANEOUS) ×2 IMPLANT
TRAY KYPHOPAK 15/2 EXPRESS (KITS) ×2 IMPLANT
TRAY KYPHOPAK 15/3 EXPRESS 1ST (MISCELLANEOUS) IMPLANT
TRAY KYPHOPAK 20/3 EXPRESS 1ST (MISCELLANEOUS) ×2 IMPLANT

## 2019-08-29 NOTE — Progress Notes (Signed)
I am unable to see her today as she is still in PACU Will see her tomorrow I will arrange for rad onc consult as an outpatient She will likely receive herceptin/perjeta next week as an outpatient Pain control/OOB/PT/OT discharge planning D  Dr. Randa Evens, MD, MPH [facility] [phone] 08/29/2019 4:37 PM

## 2019-08-29 NOTE — Progress Notes (Signed)
PROGRESS NOTE    Sophia [name]  YIF:[id number] DOB: 01/12/1981 DOA: 08/28/2019 PCP: [facility], NP    Chief Complaint  Patient presents with  . Back Pain    Brief Narrative:  Chief Complaint: Pathological fractures, sent in by oncology  HPI: Sophia [name] is a 39 y.o. 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  Patient was seen by Dr. Janese Banks on 6 4:20 AM was placed on oxycodone and dexamethasone.  Patient had port placement by Dr. Lucky Cowboy early today.  The emergency room provider, Dr. Jimmye Norman spoke with oncologist, Dr. Janese Banks, neurosurgeon Dr. Cari Caraway and orthopedist Dr. Rudene Christians with decision for kyphoplasty.  Work-up in the ER otherwise notable for WBC of 16,000,. hospitalist consulted for admission.   Subjective:  C/o hip pain,  friends at bedside help translating   Assessment & Plan:   Principal Problem:   Multiple pathological fractures Active Problems:   Primary cancer of left breast with metastasis to other site [facility])   Anxiety   Preoperative clearance   Leukocytosis   Metastatic breast cancer [facility])  Multiple pathologic compression fractures -Neurosurgery consulted, recommend kyphoplasty with ablation as she is not a good candidate for instrumented fusion -Decadron 8 mg twice daily for 10 days from 6/4 to 6/14 and Ativan for anxiety -Plan for radiofrequency ablation of tumor with kyphoplasty at the current fractured levels of T6 L1 and L4. -Pain control  Right proximal femur lesion, may need to be treated with radiofrequency ablation with prophylactic fixation, will follow Ortho recommendation  Right  sacrum and ilium tumor, Ortho recommend radiofrequency ablation with cement augmentation  Left acetabular lesion , does not appear symptomatic  Leukocytosis, steroid-induced  Transaminitis; unclear etiology , PET scan no abnormal hypermetabolic activity within the  Liver Check hepatitis panel   DVT prophylaxis: SCDs for now, Lovenox if no more procedure planned Code Status: Full Family Communication: Patient is a single mother with 3 children no immediate family in the [place], friends updated at bedside with permission Disposition:   Status is: Inpatient     Dispo: The patient is from: Home              Anticipated d/c is to: Home              Anticipated d/c date is: TBD, Pending on pain control, Ortho and oncology clearance                Consultants:  Orthopedic Oncology Neurosurgery  Procedures:   Radiofrequency ablation with kyphoplasty  Antimicrobials:   none    Objective: Vitals:   08/29/19 1535 08/29/19 1550 08/29/19 1605 08/29/19 1620  BP: 120/76 123/81 118/82 127/89  Pulse: 72 65 73 76  Resp: 12 14 14 11   Temp:    98.5 F (36.9 C)  TempSrc:      SpO2: 98% 96% 94% 97%  Weight:      Height:        Intake/Output Summary (Last 24 hours) at 08/29/2019 1733 Last data filed at 08/29/2019 1632 Gross per 24 hour  Intake 1626.5 ml  Output --  Net 1626.5 ml   Filed Weights   08/28/19 1938  Weight: 60.8 kg  Examination:  General exam: calm, NAD Respiratory system: Clear to auscultation. Respiratory effort normal. Cardiovascular system: S1 & S2 heard, RRR. No JVD, no murmur, No pedal edema. Gastrointestinal system: Abdomen is nondistended, soft and nontender. No organomegaly or masses felt. Normal bowel sounds heard. Central nervous system: Alert and oriented. No focal neurological deficits. Extremities: Symmetric 5 x 5 power. Skin: No rashes, lesions or ulcers Psychiatry: Judgement and insight appear normal. Mood & affect appropriate.     Data  Reviewed: I have personally reviewed following labs and imaging studies  CBC: Recent Labs  Lab 08/28/19 2044  WBC 16.7*  NEUTROABS 15.0*  HGB 14.3  HCT 41.2  MCV 84.4  PLT 446*    Basic Metabolic Panel: Recent Labs  Lab 08/28/19 2044  NA 133*  K 3.8  CL 101  CO2 24  GLUCOSE 130*  BUN 21*  CREATININE 0.61  CALCIUM 8.8*    GFR: Estimated Creatinine Clearance: 78.2 mL/min (by C-G formula based on SCr of 0.61 mg/dL).  Liver Function Tests: Recent Labs  Lab 08/28/19 2044  AST 46*  ALT 68*  ALKPHOS 99  BILITOT 0.6  PROT 7.1  ALBUMIN 3.4*    CBG: Recent Labs  Lab 08/27/19 1224  GLUCAP 78     Recent Results (from the past 240 hour(s))  SARS CORONAVIRUS 2 (TAT 6-24 HRS) Nasopharyngeal Nasopharyngeal Swab     Status: None   Collection Time: 08/26/19  1:27 PM   Specimen: Nasopharyngeal Swab  Result Value Ref Range Status   SARS Coronavirus 2 NEGATIVE NEGATIVE Final    Comment: (NOTE) SARS-CoV-2 target nucleic acids are NOT DETECTED. The SARS-CoV-2 RNA is generally detectable in upper and lower respiratory specimens during the acute phase of infection. Negative results do not preclude SARS-CoV-2 infection, do not rule out co-infections with other pathogens, and should not be used as the sole basis for treatment or other patient management decisions. Negative results must be combined with clinical observations, patient history, and epidemiological information. The expected result is Negative. Fact Sheet for Patients: SugarRoll.be Fact Sheet for Healthcare Providers: https://www.woods-mathews.com/ This test is not yet approved or cleared by the [place] FDA and  has been authorized for detection and/or diagnosis of SARS-CoV-2 by FDA under an Emergency Use Authorization (EUA). This EUA will remain  in effect (meaning this test can be used) for the duration of the COVID-19 declaration under Section 56 4(b)(1) of  the Act, 21 U.S.C. section 360bbb-3(b)(1), unless the authorization is terminated or revoked sooner. Performed at [facility], [place] [address] [place], [place] [address]   SARS Coronavirus 2 by RT PCR (hospital order, performed in [facility] hospital lab) Nasopharyngeal Nasopharyngeal Swab     Status: None   Collection Time: 08/29/19  4:12 AM   Specimen: Nasopharyngeal Swab  Result Value Ref Range Status   SARS Coronavirus 2 NEGATIVE NEGATIVE Final    Comment: (NOTE) SARS-CoV-2 target nucleic acids are NOT DETECTED.  The SARS-CoV-2 RNA is generally detectable in upper and lower respiratory specimens during the acute phase of infection. The lowest concentration of SARS-CoV-2 viral copies this assay can detect is 250 copies / mL. A negative result does not preclude SARS-CoV-2 infection and should not be used as the sole basis for treatment or other patient management decisions.  A negative result may occur with improper specimen collection / handling, submission of specimen other than nasopharyngeal swab, presence of viral mutation(s) within the areas targeted by this assay, and inadequate number of  viral copies (<250 copies / mL). A negative result must be combined with clinical observations, patient history, and epidemiological information.  Fact Sheet for Patients:   StrictlyIdeas.no  Fact Sheet for Healthcare Providers: BankingDealers.co.za  This test is not yet approved or  cleared by the [place] FDA and has been authorized for detection and/or diagnosis of SARS-CoV-2 by FDA under an Emergency Use Authorization (EUA).  This EUA will remain in effect (meaning this test can be used) for the duration of the COVID-19 declaration under Section 564(b)(1) of the Act, 21 U.S.C. section 360bbb-3(b)(1), unless the authorization is terminated or revoked sooner.  Performed at [facility], [address]  [place], [place] [address]          Radiology Studies: DG Thoracic Spine 2 View  Result Date: 08/29/2019 CLINICAL DATA:  Vertebral augmentation. EXAM: THORACIC SPINE 2 VIEWS; DG C-ARM 1-60 MIN COMPARISON:  MRI 08/25/2019 FINDINGS: Multiple C-arm images are provided. In the lumbar region, vertebral augmentation at L1 has a good appearance. Vertebral augmentation at the L4 level shows methylmethacrylate throughout the vertebral body, and also apparently based on this 1 projection a large amount within the spinal canal, measuring up to 9 mm in thickness and extending from the superior aspect of L4 down to the superior aspect of L5. This could result in compressive stenosis. Thoracic region imaging shows methylmethacrylate well distributed within the T6 vertebral body. There is probably some extension into the disc space. IMPRESSION: Vertebral augmentation at T6, L1 and L4. At L4, there appears to be ventral epidural cement measuring up to 9 mm in thickness. See above. Electronically Signed   By: Nelson Chimes M.D.   On: 08/29/2019 15:08   MR Brain W and Wo Contrast  Result Date: 08/29/2019 CLINICAL DATA:  Metastatic breast cancer. EXAM: MRI HEAD WITHOUT AND WITH CONTRAST TECHNIQUE: Multiplanar, multiecho pulse sequences of the brain and surrounding structures were obtained without and with intravenous contrast. CONTRAST:  21mL GADAVIST GADOBUTROL 1 MMOL/ML IV SOLN COMPARISON:  None. FINDINGS: Brain: There is no acute infarct, acute hemorrhage or extra-axial collection. Normal white matter signal. CSF spaces are normal. No contrast-enhancing lesions. No chronic microhemorrhage. Midline structures are normal. Vascular: Normal flow voids Skull and upper cervical spine: Normal bone marrow signal. Sinuses/Orbits: Paranasal sinuses and mastoids are clear. Normal orbits. Other: None IMPRESSION: Normal brain MRI. No evidence of intracranial metastatic disease. Electronically Signed   By: Ulyses Jarred M.D.   On:  08/29/2019 02:07   PERIPHERAL VASCULAR CATHETERIZATION  Result Date: 08/28/2019 See op note  DG C-Arm 1-60 Min  Result Date: 08/29/2019 CLINICAL DATA:  Vertebral augmentation. EXAM: THORACIC SPINE 2 VIEWS; DG C-ARM 1-60 MIN COMPARISON:  MRI 08/25/2019 FINDINGS: Multiple C-arm images are provided. In the lumbar region, vertebral augmentation at L1 has a good appearance. Vertebral augmentation at the L4 level shows methylmethacrylate throughout the vertebral body, and also apparently based on this 1 projection a large amount within the spinal canal, measuring up to 9 mm in thickness and extending from the superior aspect of L4 down to the superior aspect of L5. This could result in compressive stenosis. Thoracic region imaging shows methylmethacrylate well distributed within the T6 vertebral body. There is probably some extension into the disc space. IMPRESSION: Vertebral augmentation at T6, L1 and L4. At L4, there appears to be ventral epidural cement measuring up to 9 mm in thickness. See above. Electronically Signed   By: Nelson Chimes M.D.   On: 08/29/2019 15:08  DG C-Arm 1-60 Min  Result Date: 08/29/2019 CLINICAL DATA:  Vertebral augmentation. EXAM: THORACIC SPINE 2 VIEWS; DG C-ARM 1-60 MIN COMPARISON:  MRI 08/25/2019 FINDINGS: Multiple C-arm images are provided. In the lumbar region, vertebral augmentation at L1 has a good appearance. Vertebral augmentation at the L4 level shows methylmethacrylate throughout the vertebral body, and also apparently based on this 1 projection a large amount within the spinal canal, measuring up to 9 mm in thickness and extending from the superior aspect of L4 down to the superior aspect of L5. This could result in compressive stenosis. Thoracic region imaging shows methylmethacrylate well distributed within the T6 vertebral body. There is probably some extension into the disc space. IMPRESSION: Vertebral augmentation at T6, L1 and L4. At L4, there appears to be ventral  epidural cement measuring up to 9 mm in thickness. See above. Electronically Signed   By: Nelson Chimes M.D.   On: 08/29/2019 15:08   DG Hip Unilat W or Wo Pelvis 2-3 Views Right  Result Date: 08/28/2019 CLINICAL DATA:  Right hip pain. History of metastatic breast cancer. EXAM: DG HIP (WITH OR WITHOUT PELVIS) 2-3V RIGHT COMPARISON:  None. FINDINGS: There is no evidence of hip fracture or dislocation. There is no evidence of arthropathy. A 4 mm round sclerotic focus is seen within the inter trochanteric region of the proximal right femur. IMPRESSION: Small sclerotic focus within the proximal right femur which may represent a small bone island. Given the patient's history of metastatic breast cancer, a small metastatic focus cannot be excluded. Electronically Signed   By: Virgina Norfolk M.D.   On: 08/28/2019 21:01        Scheduled Meds: . Chlorhexidine Gluconate Cloth  6 each Topical Daily  . dexamethasone (DECADRON) injection  8 mg Intravenous Q12H  . diphenhydrAMINE      . enoxaparin (LOVENOX) injection  40 mg Subcutaneous Q24H   Continuous Infusions: . sodium chloride 75 mL/hr at 08/29/19 1220     LOS: 0 days     Time spent: 57mins I have personally reviewed and interpreted on  08/29/2019 daily labs, tele strips, imagings as discussed above under date review session and assessment and plans.  I reviewed all nursing notes, pharmacy notes, consultant notes,  vitals, pertinent old records  I have discussed plan of care as described above with RN , patient and friends on 08/29/2019  Voice Recognition /Dragon dictation system was used to create this note, attempts have been made to correct errors. Please contact the author with questions and/or clarifications.   Florencia Reasons, MD PhD FACP [facility]  Available via Epic secure chat 7am-7pm for nonurgent issues Please page for urgent issues To page the attending provider between 7A-7P or the covering provider during after hours  7P-7A, please log into the web site [email or web address] and access using universal [place] password for that web site. If you do not have the password, please call the hospital operator.    08/29/2019, 5:33 PM

## 2019-08-29 NOTE — Consult Note (Signed)
Patient discussed with Drs.Jimmye Norman and Rudene Christians.  Patient unfortunately has been diagnosed with metastatic breast cancer with involvement of 3 different vertebral levels - T6, L1, L4.  She is not a good candidate for instrumented fusion, but may be a good candidate for kyphoplasty with ablation.  Dr. Rudene Christians is able to perform this service, so as agreed to see [name] to discuss treatment.    She will need XRT and chemotherapy after this treatment per her oncologist Dr. Janese Banks.  Dr. Rudene Christians has agreed to see Ms. [name] on 6/11.

## 2019-08-29 NOTE — Consult Note (Signed)
Reason for Consult: Multiple pathologic compression fractures thoracic and lumbar spine, multiple bone lesions Referring Physician: Dr. Aldona Bar [name] is an 39 y.o. female.  HPI: Patient is a 39 year old who suffers from metastatic breast cancer recently diagnosed.  It is diffusely metastatic on her recent PET scan she has multiple compression fractures present that are pathologic as well as lesions in her right hip sacrum and ilium as well as left posterior acetabular area.  There are other diffuse lesions in the spine cervical thoracic and lumbar that do not have current fractures.  She has pain in her back at the levels of the fractures as well as in the right hip.  Atraumatic injuries causing the pathologic fractures.  She has been able to walk with pain.  Past Medical History:  Diagnosis Date  . Anxiety   . Cancer ([place])   . Vertigo     Past Surgical History:  Procedure Laterality Date  . BREAST BIOPSY Left 08/14/2019   [place] bx of mass, coil marker, path pending  . BREAST BIOPSY Left 08/14/2019   [place] bx of LN, hydromarker, path pending  . BREAST BIOPSY Left 08/14/2019   affirm bx of calcs, x marker, path pending  . PORTA CATH INSERTION N/A 08/28/2019   Procedure: PORTA CATH INSERTION;  Surgeon: Algernon Huxley, MD;  Location: [facility];  Service: Cardiovascular;  Laterality: N/A;    Family History  Problem Relation Age of Onset  . Colon cancer Maternal Uncle     Social History:  reports that she has never smoked. She has never used smokeless tobacco. She reports current alcohol use. She reports previous drug use.  Allergies: No Known Allergies  Medications: I have reviewed the patient's current medications.  Results for orders placed or performed during the hospital encounter of 08/28/19 (from the past 48 hour(s))  Pregnancy, urine POC     Status: None   Collection Time: 08/28/19  8:34 PM  Result Value Ref Range   Preg Test, Ur NEGATIVE NEGATIVE     Comment:        THE SENSITIVITY OF THIS METHODOLOGY IS >24 mIU/mL   CBC with Differential     Status: Abnormal   Collection Time: 08/28/19  8:44 PM  Result Value Ref Range   WBC 16.7 (H) 4.0 - 10.5 K/uL   RBC 4.88 3.87 - 5.11 MIL/uL   Hemoglobin 14.3 12.0 - 15.0 g/dL   HCT 41.2 36 - 46 %   MCV 84.4 80.0 - 100.0 fL   MCH 29.3 26.0 - 34.0 pg   MCHC 34.7 30.0 - 36.0 g/dL   RDW 12.8 11.5 - 15.5 %   Platelets 446 (H) 150 - 400 K/uL   nRBC 0.0 0.0 - 0.2 %   Neutrophils Relative % 90 %   Neutro Abs 15.0 (H) 1.7 - 7.7 K/uL   Lymphocytes Relative 5 %   Lymphs Abs 0.8 0.7 - 4.0 K/uL   Monocytes Relative 3 %   Monocytes Absolute 0.5 0 - 1 K/uL   Eosinophils Relative 0 %   Eosinophils Absolute 0.0 0 - 0 K/uL   Basophils Relative 0 %   Basophils Absolute 0.1 0 - 0 K/uL   Immature Granulocytes 2 %   Abs Immature Granulocytes 0.37 (H) 0.00 - 0.07 K/uL    Comment: Performed at [facility], [address], [place], [place] [address]  Comprehensive metabolic panel     Status: Abnormal   Collection Time: 08/28/19  8:44 PM  Result Value Ref Range   Sodium 133 (L) 135 - 145 mmol/L   Potassium 3.8 3.5 - 5.1 mmol/L   Chloride 101 98 - 111 mmol/L   CO2 24 22 - 32 mmol/L   Glucose, Bld 130 (H) 70 - 99 mg/dL    Comment: Glucose reference range applies only to samples taken after fasting for at least 8 hours.   BUN 21 (H) 6 - 20 mg/dL   Creatinine, Ser 0.61 0.44 - 1.00 mg/dL   Calcium 8.8 (L) 8.9 - 10.3 mg/dL   Total Protein 7.1 6.5 - 8.1 g/dL   Albumin 3.4 (L) 3.5 - 5.0 g/dL   AST 46 (H) 15 - 41 U/L   ALT 68 (H) 0 - 44 U/L   Alkaline Phosphatase 99 38 - 126 U/L   Total Bilirubin 0.6 0.3 - 1.2 mg/dL   GFR calc non Af Amer >60 >60 mL/min   GFR calc Af Amer >60 >60 mL/min   Anion gap 8 5 - 15    Comment: Performed at [facility], [address] [place], [place] [address]  Urinalysis, Complete w Microscopic     Status: Abnormal   Collection Time: 08/28/19  8:44 PM   Result Value Ref Range   Color, Urine STRAW (A) YELLOW   APPearance CLEAR (A) CLEAR   Specific Gravity, Urine 1.008 1.005 - 1.030   pH 6.0 5.0 - 8.0   Glucose, UA NEGATIVE NEGATIVE mg/dL   Hgb urine dipstick NEGATIVE NEGATIVE   Bilirubin Urine NEGATIVE NEGATIVE   Ketones, ur NEGATIVE NEGATIVE mg/dL   Protein, ur NEGATIVE NEGATIVE mg/dL   Nitrite NEGATIVE NEGATIVE   Leukocytes,Ua TRACE (A) NEGATIVE   RBC / HPF 0-5 0 - 5 RBC/hpf   WBC, UA 0-5 0 - 5 WBC/hpf   Bacteria, UA NONE SEEN NONE SEEN   Squamous Epithelial / LPF 0-5 0 - 5    Comment: Performed at [facility], [address] [place], [place] [address]  SARS Coronavirus 2 by RT PCR (hospital order, performed in [place] hospital lab) Nasopharyngeal Nasopharyngeal Swab     Status: None   Collection Time: 08/29/19  4:12 AM   Specimen: Nasopharyngeal Swab  Result Value Ref Range   SARS Coronavirus 2 NEGATIVE NEGATIVE    Comment: (NOTE) SARS-CoV-2 target nucleic acids are NOT DETECTED.  The SARS-CoV-2 RNA is generally detectable in upper and lower respiratory specimens during the acute phase of infection. The lowest concentration of SARS-CoV-2 viral copies this assay can detect is 250 copies / mL. A negative result does not preclude SARS-CoV-2 infection and should not be used as the sole basis for treatment or other patient management decisions.  A negative result may occur with improper specimen collection / handling, submission of specimen other than nasopharyngeal swab, presence of viral mutation(s) within the areas targeted by this assay, and inadequate number of viral copies (<250 copies / mL). A negative result must be combined with clinical observations, patient history, and epidemiological information.  Fact Sheet for Patients:   StrictlyIdeas.no  Fact Sheet for Healthcare Providers: BankingDealers.co.za  This test is not yet approved or  cleared by the  [place] FDA and has been authorized for detection and/or diagnosis of SARS-CoV-2 by FDA under an Emergency Use Authorization (EUA).  This EUA will remain in effect (meaning this test can be used) for the duration of the COVID-19 declaration under Section 564(b)(1) of the Act, 21 U.S.C. section 360bbb-3(b)(1), unless the  authorization is terminated or revoked sooner.  Performed at [facility], [address], [place], [place] [address]     MR Brain W and [place] Contrast  Result Date: 08/29/2019 CLINICAL DATA:  Metastatic breast cancer. EXAM: MRI HEAD WITHOUT AND WITH CONTRAST TECHNIQUE: Multiplanar, multiecho pulse sequences of the brain and surrounding structures were obtained without and with intravenous contrast. CONTRAST:  22mL GADAVIST GADOBUTROL 1 MMOL/ML IV SOLN COMPARISON:  None. FINDINGS: Brain: There is no acute infarct, acute hemorrhage or extra-axial collection. Normal white matter signal. CSF spaces are normal. No contrast-enhancing lesions. No chronic microhemorrhage. Midline structures are normal. Vascular: Normal flow voids Skull and upper cervical spine: Normal bone marrow signal. Sinuses/Orbits: Paranasal sinuses and mastoids are clear. Normal orbits. Other: None IMPRESSION: Normal brain MRI. No evidence of intracranial metastatic disease. Electronically Signed   By: Ulyses Jarred M.D.   On: 08/29/2019 02:07   PERIPHERAL VASCULAR CATHETERIZATION  Result Date: 08/28/2019 See op note  NM PET Image Initial (PI) Skull Base To Thigh  Addendum Date: 08/27/2019   ADDENDUM REPORT: 08/27/2019 17:27 ADDENDUM: These results will be called to the ordering clinician or representative by the Radiologist Assistant, and communication documented in the PACS or [employer]. Electronically Signed   By: Zetta Bills M.D.   On: 08/27/2019 17:27   Result Date: 08/27/2019 CLINICAL DATA:  Initial treatment strategy for breast cancer. EXAM: NUCLEAR MEDICINE PET SKULL BASE TO THIGH  TECHNIQUE: 7.05 mCi F-18 FDG was injected intravenously. Full-ring PET imaging was performed from the skull base to thigh after the radiotracer. CT data was obtained and used for attenuation correction and anatomic localization. Fasting blood glucose: 78 mg/dl COMPARISON:  Spinal evaluations of 08/25/2019 FINDINGS: Mediastinal blood pool activity: SUV max 2.72 Liver activity: SUV max not applicable NECK: Asymmetric tonsillar activity RIGHT palatine tonsils approximately select approximately SUV 10.4, on the LEFT 4.5. No hypermetabolic lymph nodes in the neck, see below for thoracic inlet lymph nodes. Incidental CT findings: none CHEST: LEFT supraclavicular lymph node (image 55 of series 4) (SUVmax = 3.7 LEFT axillary lymph node (image 60, series 4) 7 mm (SUVmax = 5.1 with similar size and metabolically active lymph nodes in the area at least 7 additional lymph nodes. Smaller mildly hypermetabolic lymph nodes tracking towards the LEFT supraclavicular region under LEFT subpectoral musculature with a tiny lymph node with the maximum SUV of 2.5 seen on image 57 of series 4 measuring 4 mm. See below for rib lesions.) Asymmetric soft tissue in the LEFT breast and marker for biopsy in an area with a maximum SUV of 7.9 that measures approximately 2.7 cm maximal dimension (image 80, series 4) Contralateral axillary lymph node (SUVmax = 2.6 (image 65, series 4) 6 mm.) Incidental CT findings: Mild calcified atheromatous plaque in the thoracic aorta. Heart size normal without pericardial effusion. Central pulmonary vasculature is normal caliber. Signs of basilar atelectasis question of background ground-glass but no consolidation, no pleural effusion or suspicious pulmonary nodule. ABDOMEN/PELVIS: No abnormal hypermetabolic activity within the liver, pancreas, adrenal glands, or spleen. No hypermetabolic lymph nodes in the abdomen or pelvis. Bilateral uptake in the adnexa along the expected course of the LEFT and RIGHT ureter  is slightly more diffuse than expected for ureteral related activity but given bilateral appearance this is suspected Incidental CT findings: Cystic structures in the RIGHT adnexa without FDG uptake likely ovarian cysts largest measuring approximately 2.7 x 2.4 cm. RIGHT intrarenal calculus. Interpolar RIGHT kidney 4 mm. No acute gastrointestinal process. The  appendix is normal. SKELETON: Diffuse skeletal metastatic disease. LEFT C1/skull base lesion with soft tissue at the foramen magnum (image 17, series 4) (SUVmax = 9.2 soft tissue in this area measuring approximately 12 x 12 mm and extending into the occipital condyle on the LEFT likely and also involving C1. C2 metastatic focus markedly hypermetabolic (SUVmax = 9.1 associated with vertebral body destruction measuring approximately 9 x 6 mm. Destructive changes at the C5 level also with marked hypermetabolic activity. Lesion associated with vertebral body destruction at T5-T6 showing marked hypermetabolic activity better demonstrated on the thoracic spine MRI. Pathologic fracture also at the L1 level and with adjacent destruction of L2 is associated with hypermetabolic activity as are multiple other foci in the lumbar spine. Lesion without CT correlate in the RIGHT proximal humerus. Destructive lesion involving the RIGHT second rib (SUVmax = 13.6 signs of metastasis to the sternal body, LEFT anterior third rib, RIGHT hemi sacrum, bilateral iliac bones, LEFT ischium and RIGHT femoral neck with smaller focus in the LEFT femoral shaft. Of particular concern are lesions with bone destruction at the first sacral level within the sacral ala measuring 2.8 x 1.9 cm (SUVmax = 11.5 similar activity with some bone destruction along the sacroiliac joint measuring 1.4 by 0.8 cm adjacent to the sacral lesion. Another area of considerable concern is the cortical destruction and soft tissue associated with RIGHT femoral neck (image 216, series 4) (SUVmax = 12.9) This is  followed by the diffuse activity and cortical destruction noted in the LEFT iliac bone which is associated with extensive periosteal reaction extending from the ischium through the iliac and associated with a small fracture in the nonweightbearing portion of the iliac bone but just above the LEFT acetabulum. (SUVmax = 13.7.) ) Incidental CT findings: none IMPRESSION: 1. 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 Redemonstration of metastatic, pathologic fractures associated with multiple sites of metastatic disease throughout the spine. 2. Sites of concern for future pathologic fracture of the RIGHT sacral ala and RIGHT proximal femur. 3. Pathologic fracture along the anterior aspect of the LEFT iliac bone just above the acetabulum with diffuse iliac and ischial 4. Asymmetric tonsillar/base of tongue activity in the neck is of uncertain significance. Direct clinical inspection or close attention on follow-up is suggested. 5. LEFT breast activity and adenopathy as discussed. 6. Area of activity in the bilateral adnexa favored to represent distal ureters. Attention on follow-up. Correlation could be made with contrasted imaging of the chest, abdomen and pelvis. Electronically Signed: By: Zetta Bills M.D. On: 08/27/2019 17:23   DG Hip Unilat W or Wo Pelvis 2-3 Views Right  Result Date: 08/28/2019 CLINICAL DATA:  Right hip pain. History of metastatic breast cancer. EXAM: DG HIP (WITH OR WITHOUT PELVIS) 2-3V RIGHT COMPARISON:  None. FINDINGS: There is no evidence of hip fracture or dislocation. There is no evidence of arthropathy. A 4 mm round sclerotic focus is seen within the inter trochanteric region of the proximal right femur. IMPRESSION: Small sclerotic focus within the proximal right femur which may represent a small bone island. Given the patient's history of metastatic breast cancer, a small metastatic focus cannot be excluded. Electronically Signed   By: Virgina Norfolk M.D.   On: 08/28/2019 21:01    Review of Systems Blood pressure 121/85, pulse 83, temperature 97.9 F (36.6 C), temperature source Oral, resp. rate 19, height 5\' 1"  (1.549 m), weight 60.8 kg, last menstrual period 08/13/2019, SpO2 98 %. Physical Exam She  is able to flex extend the toes hips and knees.  Sensation is intact in the lower extremities with no clonus present.  She has tenderness to percussion in the area of T6 L1 and L4 with kyphotic deformity at T6.  She has some pain with logrolling of the right hip and is tender at the right SI area. Radiology review reveals the prior noted compression fractures as well as on PET scan and MRI of the lumbar spine showing the pelvis involvement of the right sacrum and ilium.  There is also posterior acetabular lesion on the left.  This is minimally symptomatic.  AP pelvis shows lesion in the femoral neck that is very concerning for potential impending pathologic fracture.  Cystic  Assessment/Plan: 1.  Multiple compression fractures, pathologic.  These are symptomatic and quite painful.  Plan on radiofrequency ablation of tumor with kyphoplasty at the current fractured levels of T6 L1 and L4. 2.  Right proximal femur lesion.  This could potentially be treated with radiofrequency ablation with prophylactic fixation. 3.  Right sacrum and ilium tumor.  This could be treated potentially with radiofrequency ablation and cement augmentation. 4. Left acetabular lesion is currently not very symptomatic and may not require treatment at this time, other adjunct of treatment may be of benefit.  Juluis Pitch 08/29/2019, 7:52 AM

## 2019-08-29 NOTE — Anesthesia Preprocedure Evaluation (Signed)
Anesthesia Evaluation  Patient identified by MRN, date of birth, ID band Patient awake    Reviewed: Allergy & Precautions, H&P , NPO status , Patient's Chart, lab work & pertinent test results  History of Anesthesia Complications Negative for: history of anesthetic complications  Airway Mallampati: III  TM Distance: >3 FB Neck ROM: full    Dental  (+) Chipped   Pulmonary neg pulmonary ROS, neg shortness of breath,    Pulmonary exam normal        Cardiovascular Exercise Tolerance: Good negative cardio ROS Normal cardiovascular exam     Neuro/Psych negative neurological ROS  negative psych ROS   GI/Hepatic negative GI ROS, Neg liver ROS, neg GERD  ,  Endo/Other  negative endocrine ROS  Renal/GU negative Renal ROS  negative genitourinary   Musculoskeletal   Abdominal   Peds  Hematology negative hematology ROS (+)   Anesthesia Other Findings Past Medical History: No date: Anxiety No date: Cancer [facility]) No date: Vertigo  Past Surgical History: 08/14/2019: BREAST BIOPSY; Left     Comment:  [place] bx of mass, coil marker, path pending 08/14/2019: BREAST BIOPSY; Left     Comment:  [place] bx of LN, hydromarker, path pending 08/14/2019: BREAST BIOPSY; Left     Comment:  affirm bx of calcs, x marker, path pending 08/28/2019: PORTA CATH INSERTION; N/A     Comment:  Procedure: PORTA CATH INSERTION;  Surgeon: Algernon Huxley,              MD;  Location: [place] [facility];  Service:               Cardiovascular;  Laterality: N/A;  BMI    Body Mass Index: 25.32 kg/m      Reproductive/Obstetrics negative OB ROS                             Anesthesia Physical Anesthesia Plan  ASA: II  Anesthesia Plan: General   Post-op Pain Management:    Induction: Intravenous  PONV Risk Score and Plan: Propofol infusion and TIVA  Airway Management Planned: Natural Airway and Nasal Cannula  Additional  Equipment:   Intra-op Plan:   Post-operative Plan:   Informed Consent: I have reviewed the patients History and Physical, chart, labs and discussed the procedure including the risks, benefits and alternatives for the proposed anesthesia with the patient or authorized representative who has indicated his/her understanding and acceptance.     Dental Advisory Given  Plan Discussed with: Anesthesiologist, CRNA and Surgeon  Anesthesia Plan Comments: (Consent via interpreter   Patient consented for risks of anesthesia including but not limited to:  - adverse reactions to medications - risk of intubation if required - damage to eyes, teeth, lips or other oral mucosa - nerve damage due to positioning  - sore throat or hoarseness - Damage to heart, brain, nerves, lungs, other parts of body or loss of life  Patient voiced understanding.)        Anesthesia Quick Evaluation

## 2019-08-29 NOTE — Op Note (Signed)
Date 08/29/19  Time 14:32   PATIENT: Sophia [name]   PRE-OPERATIVE DIAGNOSIS:  closed wedge compression fracture of T6, L1,L4 , pathologic secondary to metastatic breast CA   POST-OPERATIVE DIAGNOSIS:   Same PROCEDURE:  Procedure(s): KYPHOPLASTY T6 L1 and L4 with radiofrequency ablation at each level  SURGEON: Laurene Footman, MD   ASSISTANTS: None   ANESTHESIA:   local and MAC   EBL:  No intake/output data recorded.   BLOOD ADMINISTERED:none   DRAINS: none    LOCAL MEDICATIONS USED:  MARCAINE    and XYLOCAINE    SPECIMEN:  NONE   DISPOSITION OF SPECIMEN: N/A   COUNTS:  YES   TOURNIQUET:  * No tourniquets in log *   IMPLANTS: Bone cement   DICTATION: .Dragon Dictation  patient was brought to the operating room and after adequate anesthesia was obtained the patient was placed prone.  C arm was brought in in good visualization of the affected level obtained on both AP and lateral projections.  After patient identification and timeout procedures were completed, local anesthetic was infiltrated with 10 cc 1% Xylocaine infiltrated subcutaneously.  This was done the area on the each side of the planned approaches.  The back was then prepped and draped in the usual sterile manner and repeat timeout procedure carried out.  A spinal needle was brought down to the pedicle on the each side of  T6 L1 and L4 and a 50-50 mix of 1% Xylocaine half percent Sensorcaine with epinephrine total of 20 cc injected on each side.  After allowing this to set a small incision was made and the trocar was advanced into the vertebral body in an extrapedicular fashion.  Biopsy was attempted but not obtained in a level.  Drilling was carried out first at T6 with a 10 mm RFA probe placed on each side radiofrequency ablation carried out followed by placing balloons about 2 cc in each antibiotic 4 cc of cement into the body without extravasation.  Drilling was then done at L1 after radiofrequency ablation at  that level with a 15 mm probes balloons inflated with partial correction of the deformity with about 4 and half cc of balloon inflation on total. Drilling was carried out at L4 again with 15 mm probes inserted radius lobation performed with balloon inserted with inflation to  2 cc on the right and 2 cc on the left.  When the cement was appropriate consistency about 4-1/2 cc total and 2L1 and then going down to L4 approximately 5 cc unfortunately with some extravasation into the posterior epidural space, good fill superior to inferior endplates and from right to left sides along the inferior endplate.  After the cement had set the trochar was removed and permanent C-arm views obtained.  The wounds closed with Dermabond followed by Band-Aids   PLAN OF CARE:  Continue as inpatient   PATIENT DISPOSITION:  PACU - hemodynamically stable.

## 2019-08-29 NOTE — Transfer of Care (Signed)
Immediate Anesthesia Transfer of Care Note  Patient: Sophia [name]  Procedure(s) Performed: KYPHOPLASTY T6, L1,L4 ,  RADIOFREQUENCY ABLATION (N/A )  Patient Location: PACU  Anesthesia Type:General  Level of Consciousness: drowsy and patient cooperative  Airway & Oxygen Therapy: Patient Spontanous Breathing  Post-op Assessment: Report given to RN and Post -op Vital signs reviewed and stable  Post vital signs: Reviewed and stable  Last Vitals:  Vitals Value Taken Time  BP 118/69 08/29/19 1437  Temp    Pulse 80 08/29/19 1438  Resp 15 08/29/19 1438  SpO2 98 % 08/29/19 1438  Vitals shown include unvalidated device data.  Last Pain:  Vitals:   08/29/19 1127  TempSrc: Temporal  PainSc: 0-No pain      Patients Stated Pain Goal: 0 (83/35/82 5189)  Complications: No complications documented.

## 2019-08-30 DIAGNOSIS — G893 Neoplasm related pain (acute) (chronic): Secondary | ICD-10-CM

## 2019-08-30 DIAGNOSIS — Z7189 Other specified counseling: Secondary | ICD-10-CM

## 2019-08-30 DIAGNOSIS — M8440XA Pathological fracture, unspecified site, initial encounter for fracture: Secondary | ICD-10-CM

## 2019-08-30 DIAGNOSIS — C50919 Malignant neoplasm of unspecified site of unspecified female breast: Secondary | ICD-10-CM

## 2019-08-30 LAB — COMPREHENSIVE METABOLIC PANEL
ALT: 39 U/L (ref 0–44)
AST: 21 U/L (ref 15–41)
Albumin: 3 g/dL — ABNORMAL LOW (ref 3.5–5.0)
Alkaline Phosphatase: 100 U/L (ref 38–126)
Anion gap: 10 (ref 5–15)
BUN: 12 mg/dL (ref 6–20)
CO2: 24 mmol/L (ref 22–32)
Calcium: 8.6 mg/dL — ABNORMAL LOW (ref 8.9–10.3)
Chloride: 102 mmol/L (ref 98–111)
Creatinine, Ser: 0.53 mg/dL (ref 0.44–1.00)
GFR calc Af Amer: >60 mL/min (ref >60)
GFR calc non Af Amer: >60 mL/min (ref >60)
Glucose, Bld: 123 mg/dL — ABNORMAL HIGH (ref 70–99)
Potassium: 4 mmol/L (ref 3.5–5.1)
Sodium: 136 mmol/L (ref 135–145)
Total Bilirubin: 0.5 mg/dL (ref 0.3–1.2)
Total Protein: 6.1 g/dL — ABNORMAL LOW (ref 6.5–8.1)

## 2019-08-30 LAB — HEPATITIS PANEL, ACUTE
HCV Ab: NONREACTIVE
Hep A IgM: NONREACTIVE
Hep B C IgM: NONREACTIVE
Hepatitis B Surface Ag: NONREACTIVE

## 2019-08-30 MED ORDER — GABAPENTIN 300 MG PO CAPS
300.0000 mg | ORAL_CAPSULE | Freq: Two times a day (BID) | ORAL | Status: DC
  Administered 2019-08-30 – 2019-09-04 (×9): 300 mg via ORAL
  Filled 2019-08-30 (×9): qty 1

## 2019-08-30 MED ORDER — SENNOSIDES-DOCUSATE SODIUM 8.6-50 MG PO TABS
1.0000 | ORAL_TABLET | Freq: Two times a day (BID) | ORAL | Status: DC
  Administered 2019-08-30 – 2019-09-04 (×10): 1 via ORAL
  Filled 2019-08-30 (×10): qty 1

## 2019-08-30 NOTE — Progress Notes (Signed)
Physical Therapy Treatment Patient Details Name: Sophia [name] MRN: [id number] DOB: 1980-04-29 Today's Date: 08/30/2019    History of Present Illness 39 yo female with onset of severe back pain was admitted, found mult pathological spinal fractures after recent dx with primary breast CA.  Had kyphoplasty on 08/29/19, for T6, L1 and L4.  Pain not relieved by procedure, circumferential on abdomen and back.  Had leukocytosis, anxiety and not also R femoral site of mets.      PT Comments    Pt was seen for mobility with instruction of the back precautions in both written and verbal format.  Review of exercises with pt and her family member in attendance.  Follow up with mobiltiy tomorrow with review of precautions as needed.   Follow Up Recommendations  SNF;Home health PT;Supervision/Assistance - 24 hour;Supervision for mobility/OOB     Equipment Recommendations  Rolling walker with 5" wheels    Recommendations for Other Services       Precautions / Restrictions Precautions Precautions: Fall;Back Precaution Booklet Issued: Yes (comment) Precaution Comments: reviewed back precautions with pt Restrictions Weight Bearing Restrictions: No    Mobility  Bed Mobility Overal bed mobility: Needs Assistance             General bed mobility comments: pt was up when PT arrived but nsg reports assisting out due to pain.  Transfers Overall transfer level: Needs assistance               General transfer comment: declined to stand up  Ambulation/Gait                 Stairs             Wheelchair Mobility    Modified Rankin (Stroke Patients Only)       Balance Overall balance assessment: Needs assistance                                          Cognition Arousal/Alertness: Awake/alert Behavior During Therapy: WFL for tasks assessed/performed Overall Cognitive Status: Within Functional Limits for tasks assessed                                  General Comments: pt was able to give history of her prior level      Exercises General Exercises - Lower Extremity Ankle Circles/Pumps: AROM;5 reps Quad Sets: AROM;10 reps Gluteal Sets: AROM;10 reps Hip ABduction/ADduction: AROM;10 reps    General Comments General comments (skin integrity, edema, etc.): reviewed exercises and precautions for spine, voiced understanding      Pertinent Vitals/Pain Pain Assessment: Faces Faces Pain Scale: Hurts even more Pain Location: back to abdomen circumferentially Pain Descriptors / Indicators: Grimacing;Guarding    Home Living                      Prior Function            PT Goals (current goals can now be found in the care plan section) Acute Rehab PT Goals Patient Stated Goal: to hurt less PT Goal Formulation: With patient Time For Goal Achievement: 09/13/19 Potential to Achieve Goals: Good    Frequency    7X/week      PT Plan      Co-evaluation  AM-PAC PT "6 Clicks" Mobility   Outcome Measure  Help needed turning from your back to your side while in a flat bed without using bedrails?: A Little Help needed moving from lying on your back to sitting on the side of a flat bed without using bedrails?: A Lot Help needed moving to and from a bed to a chair (including a wheelchair)?: A Lot Help needed standing up from a chair using your arms (e.g., wheelchair or bedside chair)?: A Lot Help needed to walk in hospital room?: A Little Help needed climbing 3-5 steps with a railing? : Total 6 Click Score: 13    End of Session   Activity Tolerance: Patient limited by pain;Treatment limited secondary to medical complications (Comment) Patient left: in chair;with call bell/phone within reach;with nursing/sitter in room Nurse Communication: Mobility status;Other (comment) (plan for rehab request) PT Visit Diagnosis: Unsteadiness on feet (R26.81);Pain;Muscle weakness  (generalized) (M62.81);Other abnormalities of gait and mobility (R26.89);Difficulty in walking, not elsewhere classified (R26.2) Pain - Right/Left:  (abdomen and spine) Pain - part of body:  (back and abdomen)     Time: 8478-4128 PT Time Calculation (min) (ACUTE ONLY): 17 min  Charges:  $Therapeutic Exercise: 8-22 mins                    Ramond Dial 08/30/2019, 8:17 PM  Mee Hives, PT MS Acute Rehab Dept. Number: [place] [facility] [place]

## 2019-08-30 NOTE — Progress Notes (Signed)
PROGRESS NOTE    Sophia [name]  ZJQ:[id number] DOB: Nov 05, 1980 DOA: 08/28/2019 PCP: [facility], NP    Chief Complaint  Patient presents with  . Back Pain    Brief Narrative:  Chief Complaint: Pathological fractures, sent in by oncology  HPI: Sophia [name] is a 39 y.o. 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  Patient was seen by Dr. Janese Banks on 6 4:20 AM was placed on oxycodone and dexamethasone.  Patient had port placement by Dr. Lucky Cowboy early today.  The emergency room provider, Dr. Jimmye Norman spoke with oncologist, Dr. Janese Banks, neurosurgeon Dr. Cari Caraway and orthopedist Dr. Rudene Christians with decision for kyphoplasty.  Work-up in the ER otherwise notable for WBC of 16,000,. hospitalist consulted for admission.   Subjective:  C/o hip pain, pelvic pain ,bandlike , worse with activity Denies back pain at rest  Assessment & Plan:   Principal Problem:   Multiple pathological fractures Active Problems:   Primary cancer of left breast with metastasis to other site [facility])   Anxiety   Preoperative clearance   Leukocytosis   Metastatic breast cancer [facility])  Multiple pathologic compression fractures -Neurosurgery consulted, recommend kyphoplasty with ablation as she is not a good candidate for instrumented fusion -Decadron 8 mg twice daily for 10 days from 6/4 to 6/14 and Ativan for anxiety -Plan for radiofrequency ablation of tumor with kyphoplasty at the current fractured levels of T6 L1 and L4. -Pain control  Right proximal femur lesion, may need to be treated with radiofrequency ablation with prophylactic fixation, will follow  Ortho recommendation  Right sacrum and ilium tumor, Ortho recommend radiofrequency ablation with cement augmentation  Left acetabular lesion , does not appear symptomatic  Leukocytosis, steroid-induced  Transaminitis; unclear etiology , PET scan no abnormal hypermetabolic activity within the  Liver Repeat lft back down to normal range  hepatitis panel in process  Tele strips personally reviewed unremarkable, will DC telemetry  DVT prophylaxis: Lovenox  Code Status: Full Family Communication: Patient is a single mother with 3 children no immediate family in the [place], friends updated at bedside with permission on 6/11 Disposition:   Status is: Inpatient    Dispo: The patient is from: Home              Anticipated d/c is to: Home              Anticipated d/c date is: TBD, Pending on pain control, Ortho and oncology clearance                Consultants:  Orthopedic Oncology Neurosurgery  Procedures:   Radiofrequency ablation with kyphoplasty  Antimicrobials:   none    Objective: Vitals:   08/29/19 1620 08/29/19 1709 08/29/19 1940 08/29/19 2338  BP: 127/89 129/79 125/66 (!) 87/36  Pulse: 76 70 84 80  Resp: 11 18 20 20   Temp: 98.5 F (36.9 C) 98.6 F (37 C) 99 F (37.2 C) 98.7 F (37.1 C)  TempSrc:  Oral Axillary Oral  SpO2: 97% 97% 99% 98%  Weight:      Height:        Intake/Output Summary (Last 24 hours) at 08/30/2019 0810 Last data filed at 08/29/2019 2105 Gross per 24 hour  Intake 1150 ml  Output 350 ml  Net 800 ml   Filed Weights   08/28/19 1938  Weight: 60.8 kg    Examination:  General exam: calm, NAD Respiratory system: Clear to auscultation. Respiratory effort normal. Cardiovascular system: S1 & S2 heard, RRR. No JVD, no murmur, No pedal edema. Gastrointestinal system: Abdomen is nondistended, soft and nontender. No organomegaly or masses felt. Normal bowel sounds heard. Central nervous system: Alert and oriented. No focal neurological  deficits. Extremities: Symmetric 5 x 5 power. Skin: No rashes, lesions or ulcers Psychiatry: Judgement and insight appear normal. Mood & affect appropriate.     Data Reviewed: I have personally reviewed following labs and imaging studies  CBC: Recent Labs  Lab 08/28/19 2044 08/29/19 1857  WBC 16.7* 27.6*  NEUTROABS 15.0* 23.6*  HGB 14.3 14.6  HCT 41.2 42.7  MCV 84.4 88.0  PLT 446* 420*    Basic Metabolic Panel: Recent Labs  Lab 08/28/19 2044 08/30/19 0614  NA 133* 136  K 3.8 4.0  CL 101 102  CO2 24 24  GLUCOSE 130* 123*  BUN 21* 12  CREATININE 0.61 0.53  CALCIUM 8.8* 8.6*    GFR: Estimated Creatinine Clearance: 78.2 mL/min (by C-G formula based on SCr of 0.53 mg/dL).  Liver Function Tests: Recent Labs  Lab 08/28/19 2044 08/30/19 0614  AST 46* 21  ALT 68* 39  ALKPHOS 99 100  BILITOT 0.6 0.5  PROT 7.1 6.1*  ALBUMIN 3.4* 3.0*    CBG: Recent Labs  Lab 08/27/19 1224  GLUCAP 78     Recent Results (from the past 240 hour(s))  SARS CORONAVIRUS 2 (TAT 6-24 HRS) Nasopharyngeal Nasopharyngeal Swab     Status: None   Collection Time: 08/26/19  1:27 PM   Specimen: Nasopharyngeal Swab  Result Value Ref Range Status   SARS Coronavirus 2 NEGATIVE NEGATIVE Final    Comment: (NOTE) SARS-CoV-2 target nucleic acids are NOT DETECTED. The SARS-CoV-2 RNA is generally detectable in upper and lower respiratory specimens during the acute phase of infection. Negative results do not preclude SARS-CoV-2 infection, do not rule out co-infections with other pathogens, and should not be used as the sole basis for treatment or other patient management decisions. Negative results must be combined with clinical observations, patient history, and epidemiological information. The expected result is Negative. Fact Sheet for Patients: SugarRoll.be Fact Sheet for Healthcare Providers: https://www.woods-mathews.com/ This test is not yet  approved or cleared by the [place] FDA and  has been authorized for detection and/or diagnosis of SARS-CoV-2 by FDA under an Emergency Use Authorization (EUA). This EUA will remain  in effect (meaning this test can be used) for the duration of the COVID-19 declaration under Section 56 4(b)(1) of the Act, 21 U.S.C. section 360bbb-3(b)(1), unless the authorization is terminated or revoked sooner. Performed at [facility], [place] [address], [place], [place] [address]   SARS Coronavirus 2 by RT PCR (hospital order, performed in [facility] hospital lab) Nasopharyngeal Nasopharyngeal Swab     Status: None   Collection Time: 08/29/19  4:12 AM   Specimen: Nasopharyngeal Swab  Result Value Ref Range Status   SARS Coronavirus 2 NEGATIVE NEGATIVE Final    Comment: (NOTE) SARS-CoV-2 target nucleic acids are NOT DETECTED.  The SARS-CoV-2 RNA is generally detectable in upper and lower respiratory specimens during the acute phase of infection. The lowest concentration of SARS-CoV-2 viral copies this assay can detect is 250 copies / mL. A negative result does not preclude  SARS-CoV-2 infection and should not be used as the sole basis for treatment or other patient management decisions.  A negative result may occur with improper specimen collection / handling, submission of specimen other than nasopharyngeal swab, presence of viral mutation(s) within the areas targeted by this assay, and inadequate number of viral copies (<250 copies / mL). A negative result must be combined with clinical observations, patient history, and epidemiological information.  Fact Sheet for Patients:   StrictlyIdeas.no  Fact Sheet for Healthcare Providers: BankingDealers.co.za  This test is not yet approved or  cleared by the [place] FDA and has been authorized for detection and/or diagnosis of SARS-CoV-2 by FDA under an Emergency Use Authorization (EUA).   This EUA will remain in effect (meaning this test can be used) for the duration of the COVID-19 declaration under Section 564(b)(1) of the Act, 21 U.S.C. section 360bbb-3(b)(1), unless the authorization is terminated or revoked sooner.  Performed at [facility], [address] [place], [place] [address]          Radiology Studies: DG Thoracic Spine 2 View  Result Date: 08/29/2019 CLINICAL DATA:  Vertebral augmentation. EXAM: THORACIC SPINE 2 VIEWS; DG C-ARM 1-60 MIN COMPARISON:  MRI 08/25/2019 FINDINGS: Multiple C-arm images are provided. In the lumbar region, vertebral augmentation at L1 has a good appearance. Vertebral augmentation at the L4 level shows methylmethacrylate throughout the vertebral body, and also apparently based on this 1 projection a large amount within the spinal canal, measuring up to 9 mm in thickness and extending from the superior aspect of L4 down to the superior aspect of L5. This could result in compressive stenosis. Thoracic region imaging shows methylmethacrylate well distributed within the T6 vertebral body. There is probably some extension into the disc space. IMPRESSION: Vertebral augmentation at T6, L1 and L4. At L4, there appears to be ventral epidural cement measuring up to 9 mm in thickness. See above. Electronically Signed   By: Nelson Chimes M.D.   On: 08/29/2019 15:08   MR Brain W and Wo Contrast  Result Date: 08/29/2019 CLINICAL DATA:  Metastatic breast cancer. EXAM: MRI HEAD WITHOUT AND WITH CONTRAST TECHNIQUE: Multiplanar, multiecho pulse sequences of the brain and surrounding structures were obtained without and with intravenous contrast. CONTRAST:  55mL GADAVIST GADOBUTROL 1 MMOL/ML IV SOLN COMPARISON:  None. FINDINGS: Brain: There is no acute infarct, acute hemorrhage or extra-axial collection. Normal white matter signal. CSF spaces are normal. No contrast-enhancing lesions. No chronic microhemorrhage. Midline structures are normal.  Vascular: Normal flow voids Skull and upper cervical spine: Normal bone marrow signal. Sinuses/Orbits: Paranasal sinuses and mastoids are clear. Normal orbits. Other: None IMPRESSION: Normal brain MRI. No evidence of intracranial metastatic disease. Electronically Signed   By: Ulyses Jarred M.D.   On: 08/29/2019 02:07   PERIPHERAL VASCULAR CATHETERIZATION  Result Date: 08/28/2019 See op note  DG C-Arm 1-60 Min  Result Date: 08/29/2019 CLINICAL DATA:  Vertebral augmentation. EXAM: THORACIC SPINE 2 VIEWS; DG C-ARM 1-60 MIN COMPARISON:  MRI 08/25/2019 FINDINGS: Multiple C-arm images are provided. In the lumbar region, vertebral augmentation at L1 has a good appearance. Vertebral augmentation at the L4 level shows methylmethacrylate throughout the vertebral body, and also apparently based on this 1 projection a large amount within the spinal canal, measuring up to 9 mm in thickness and extending from the superior aspect of L4 down to the superior aspect of L5. This could result in compressive stenosis. Thoracic region imaging shows methylmethacrylate well distributed within the T6  vertebral body. There is probably some extension into the disc space. IMPRESSION: Vertebral augmentation at T6, L1 and L4. At L4, there appears to be ventral epidural cement measuring up to 9 mm in thickness. See above. Electronically Signed   By: Nelson Chimes M.D.   On: 08/29/2019 15:08   DG C-Arm 1-60 Min  Result Date: 08/29/2019 CLINICAL DATA:  Vertebral augmentation. EXAM: THORACIC SPINE 2 VIEWS; DG C-ARM 1-60 MIN COMPARISON:  MRI 08/25/2019 FINDINGS: Multiple C-arm images are provided. In the lumbar region, vertebral augmentation at L1 has a good appearance. Vertebral augmentation at the L4 level shows methylmethacrylate throughout the vertebral body, and also apparently based on this 1 projection a large amount within the spinal canal, measuring up to 9 mm in thickness and extending from the superior aspect of L4 down to the  superior aspect of L5. This could result in compressive stenosis. Thoracic region imaging shows methylmethacrylate well distributed within the T6 vertebral body. There is probably some extension into the disc space. IMPRESSION: Vertebral augmentation at T6, L1 and L4. At L4, there appears to be ventral epidural cement measuring up to 9 mm in thickness. See above. Electronically Signed   By: Nelson Chimes M.D.   On: 08/29/2019 15:08   DG Hip Unilat W or Wo Pelvis 2-3 Views Right  Result Date: 08/28/2019 CLINICAL DATA:  Right hip pain. History of metastatic breast cancer. EXAM: DG HIP (WITH OR WITHOUT PELVIS) 2-3V RIGHT COMPARISON:  None. FINDINGS: There is no evidence of hip fracture or dislocation. There is no evidence of arthropathy. A 4 mm round sclerotic focus is seen within the inter trochanteric region of the proximal right femur. IMPRESSION: Small sclerotic focus within the proximal right femur which may represent a small bone island. Given the patient's history of metastatic breast cancer, a small metastatic focus cannot be excluded. Electronically Signed   By: Virgina Norfolk M.D.   On: 08/28/2019 21:01        Scheduled Meds: . Chlorhexidine Gluconate Cloth  6 each Topical Daily  . dexamethasone (DECADRON) injection  8 mg Intravenous Q12H  . enoxaparin (LOVENOX) injection  40 mg Subcutaneous Q24H  . senna-docusate  1 tablet Oral BID   Continuous Infusions: . sodium chloride 75 mL/hr at 08/29/19 1220     LOS: 1 day     Time spent: 77mins I have personally reviewed and interpreted on  08/30/2019 daily labs, tele strips, imagings as discussed above under date review session and assessment and plans.  I reviewed all nursing notes, pharmacy notes, consultant notes,  vitals, pertinent old records  I have discussed plan of care as described above with RN , patient on 08/30/2019  Voice Recognition /Dragon dictation system was used to create this note, attempts have been made to  correct errors. Please contact the author with questions and/or clarifications.   Florencia Reasons, MD PhD FACP [facility]  Available via Epic secure chat 7am-7pm for nonurgent issues Please page for urgent issues To page the attending provider between 7A-7P or the covering provider during after hours 7P-7A, please log into the web site [email or web address] and access using universal [place] password for that web site. If you do not have the password, please call the hospital operator.    08/30/2019, 8:10 AM

## 2019-08-30 NOTE — Consult Note (Signed)
Hematology/Oncology Consult note [facility] [phone] [phone]  Patient Care Team: [place], [place], NP as PCP - General Rico Junker, RN as Registered Nurse Theodore Demark, RN as Registered Nurse   Name of the patient: Sophia [name]  [id number]  Feb 14, 1981    Reason for consult: Newly diagnosed metastatic breast cancer   Requesting physician Dr. 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  She was undergoing outpatient work-up and underwent MRI cervical thoracic and lumbar spine as well as PET CT scan.  Scans showed 3 areas of pathologic fracture of her spine as well as widespread bony metastatic disease and concern for impending fracture of the right hip.  Given her worsening pain she was asked to come to the ER.  She has been evaluated by Dr. Rudene Christians from orthopedic surgery and underwent kyphoplasty at 3 different levels. T6 L1 and L4 along with radiofrequency ablation.  There is a plan for potential prophylactic fixation of the right hip as well as RFA of the sacral region tomorrow.  Patient was seen at the bedside today and endorses significant pain especially on ambulation.  However she does not wish to make any changes to her pain medicines yet.  She had a bowel movement yesterday.   Pain scale- 7   Review of systems- Review of Systems  Constitutional: Positive for malaise/fatigue. Negative for chills, fever and weight loss.  HENT: Negative for congestion, ear discharge and nosebleeds.   Eyes: Negative for blurred vision.  Respiratory: Negative for cough, hemoptysis, sputum production, shortness of breath and wheezing.   Cardiovascular: Negative for chest pain, palpitations, orthopnea and claudication.  Gastrointestinal: Negative for abdominal  pain, blood in stool, constipation, diarrhea, heartburn, melena, nausea and vomiting.  Genitourinary: Negative for dysuria, flank pain, frequency, hematuria and urgency.  Musculoskeletal: Positive for back pain. Negative for joint pain and myalgias.  Skin: Negative for rash.  Neurological: Negative for dizziness, tingling, focal weakness, seizures, weakness and headaches.  Endo/Heme/Allergies: Does not bruise/bleed easily.  Psychiatric/Behavioral: Negative for depression and suicidal ideas. The patient does not have insomnia.     No Known Allergies  Patient Active Problem List   Diagnosis Date Noted  . Metastatic breast cancer ([employer]) 08/29/2019  . Primary cancer of left breast with metastasis to other site ([place]) 08/28/2019  . Multiple pathological fractures 08/28/2019  . Anxiety 08/28/2019  . Preoperative clearance 08/28/2019  . Leukocytosis 08/28/2019     Past Medical History:  Diagnosis Date  . Anxiety   . Cancer ([place])   . Vertigo      Past Surgical History:  Procedure Laterality Date  . BREAST BIOPSY Left 08/14/2019   [place] bx of mass, coil marker, path pending  . BREAST BIOPSY Left 08/14/2019   [place] bx of LN, hydromarker, path pending  . BREAST BIOPSY Left 08/14/2019   affirm bx of calcs, x marker, path pending  . PORTA CATH INSERTION N/A 08/28/2019   Procedure: PORTA CATH INSERTION;  Surgeon: Algernon Huxley, MD;  Location: [facility];  Service: Cardiovascular;  Laterality: N/A;    Social History   Socioeconomic History  . Marital status: Single    Spouse name: Not on file  . Number of children: Not on file  . Years of education: Not on file  . Highest education level: Not  on file  Occupational History  . Not on file  Tobacco Use  . Smoking status: Never Smoker  . Smokeless tobacco: Never Used  Vaping Use  . Vaping Use: Never used  Substance and Sexual Activity  . Alcohol use: Yes    Comment: occassionaly  . Drug use: Not Currently  . Sexual activity:  Not Currently    Birth control/protection: None  Other Topics Concern  . Not on file  Social History Narrative   Lives at home with children   Social Determinants of Health   Financial Resource Strain:   . Difficulty of Paying Living Expenses:   Food Insecurity:   . Worried About Charity fundraiser in the Last Year:   . Arboriculturist in the Last Year:   Transportation Needs:   . Film/video editor (Medical):   Marland Kitchen Lack of Transportation (Non-Medical):   Physical Activity:   . Days of Exercise per Week:   . Minutes of Exercise per Session:   Stress:   . Feeling of Stress :   Social Connections:   . Frequency of Communication with Friends and Family:   . Frequency of Social Gatherings with Friends and Family:   . Attends Religious Services:   . Active Member of Clubs or Organizations:   . Attends Archivist Meetings:   Marland Kitchen Marital Status:   Intimate Partner Violence:   . Fear of Current or Ex-Partner:   . Emotionally Abused:   Marland Kitchen Physically Abused:   . Sexually Abused:      Family History  Problem Relation Age of Onset  . Colon cancer Maternal Uncle      Current Facility-Administered Medications:  .  0.9 %  sodium chloride infusion, , Intravenous, Continuous, Hessie Knows, MD, Last Rate: 75 mL/hr at 08/29/19 1220, New Bag at 08/29/19 1358 .  Chlorhexidine Gluconate Cloth 2 % PADS 6 each, 6 each, Topical, Daily, Hessie Knows, MD, 6 each at 08/30/19 1504 .  cyclobenzaprine (FLEXERIL) tablet 10 mg, 10 mg, Oral, TID PRN, Hessie Knows, MD, 10 mg at 08/29/19 2218 .  dexamethasone (DECADRON) injection 8 mg, 8 mg, Intravenous, Q12H, Florencia Reasons, MD, 8 mg at 08/30/19 0912 .  enoxaparin (LOVENOX) injection 40 mg, 40 mg, Subcutaneous, Q24H, Hessie Knows, MD, 40 mg at 08/30/19 0912 .  gabapentin (NEURONTIN) capsule 300 mg, 300 mg, Oral, BID, Hessie Knows, MD .  LORazepam (ATIVAN) injection 0.5 mg, 0.5 mg, Intravenous, Q6H PRN, Hessie Knows, MD .  LORazepam (ATIVAN)  tablet 0.5 mg, 0.5 mg, Oral, Q6H PRN, Hessie Knows, MD .  meclizine (ANTIVERT) tablet 25 mg, 25 mg, Oral, TID PRN, Hessie Knows, MD .  morphine 2 MG/ML injection 2 mg, 2 mg, Intravenous, Q2H PRN, Hessie Knows, MD, 2 mg at 08/30/19 0925 .  naphazoline-pheniramine (NAPHCON-A) 0.025-0.3 % ophthalmic solution 1 drop, 1 drop, Both Eyes, QID PRN, Hessie Knows, MD .  ondansetron (ZOFRAN) tablet 4 mg, 4 mg, Oral, Q6H PRN **OR** ondansetron (ZOFRAN) injection 4 mg, 4 mg, Intravenous, Q6H PRN, Hessie Knows, MD .  oxyCODONE (Oxy IR/ROXICODONE) immediate release tablet 5 mg, 5 mg, Oral, Q4H PRN, Hessie Knows, MD, 5 mg at 08/30/19 1455 .  senna-docusate (Senokot-S) tablet 1 tablet, 1 tablet, Oral, BID, Florencia Reasons, MD, 1 tablet at 08/30/19 1000   Physical exam:  Vitals:   08/29/19 1709 08/29/19 1940 08/29/19 2338 08/30/19 0820  BP: 129/79 125/66 (!) 87/36 124/89  Pulse: 70 84 80 75  Resp: 18 20 20  18  Temp: 98.6 F (37 C) 99 F (37.2 C) 98.7 F (37.1 C) 97.9 F (36.6 C)  TempSrc: Oral Axillary Oral Oral  SpO2: 97% 99% 98% 97%  Weight:      Height:       Physical Exam Constitutional:      Comments: Patient is lying on a recliner and appears in distress due to ongoing back pain  Cardiovascular:     Rate and Rhythm: Normal rate and regular rhythm.     Heart sounds: Normal heart sounds.  Pulmonary:     Effort: Pulmonary effort is normal.     Breath sounds: Normal breath sounds.  Abdominal:     General: Bowel sounds are normal.     Palpations: Abdomen is soft.  Skin:    General: Skin is warm and dry.  Neurological:     Mental Status: She is alert and oriented to person, place, and time.        CMP Latest Ref Rng & Units 08/30/2019  Glucose 70 - 99 mg/dL 123(H)  BUN 6 - 20 mg/dL 12  Creatinine 0.44 - 1.00 mg/dL 0.53  Sodium 135 - 145 mmol/L 136  Potassium 3.5 - 5.1 mmol/L 4.0  Chloride 98 - 111 mmol/L 102  CO2 22 - 32 mmol/L 24  Calcium 8.9 - 10.3 mg/dL 8.6(L)  Total Protein 6.5  - 8.1 g/dL 6.1(L)  Total Bilirubin 0.3 - 1.2 mg/dL 0.5  Alkaline Phos 38 - 126 U/L 100  AST 15 - 41 U/L 21  ALT 0 - 44 U/L 39   CBC Latest Ref Rng & Units 08/29/2019  WBC 4.0 - 10.5 K/uL 27.6(H)  Hemoglobin 12.0 - 15.0 g/dL 14.6  Hematocrit 36 - 46 % 42.7  Platelets 150 - 400 K/uL 420(H)    '@IMAGES'$ @  DG Thoracic Spine 2 View  Result Date: 08/29/2019 CLINICAL DATA:  Vertebral augmentation. EXAM: THORACIC SPINE 2 VIEWS; DG C-ARM 1-60 MIN COMPARISON:  MRI 08/25/2019 FINDINGS: Multiple C-arm images are provided. In the lumbar region, vertebral augmentation at L1 has a good appearance. Vertebral augmentation at the L4 level shows methylmethacrylate throughout the vertebral body, and also apparently based on this 1 projection a large amount within the spinal canal, measuring up to 9 mm in thickness and extending from the superior aspect of L4 down to the superior aspect of L5. This could result in compressive stenosis. Thoracic region imaging shows methylmethacrylate well distributed within the T6 vertebral body. There is probably some extension into the disc space. IMPRESSION: Vertebral augmentation at T6, L1 and L4. At L4, there appears to be ventral epidural cement measuring up to 9 mm in thickness. See above. Electronically Signed   By: Nelson Chimes M.D.   On: 08/29/2019 15:08   MR Brain W and Wo Contrast  Result Date: 08/29/2019 CLINICAL DATA:  Metastatic breast cancer. EXAM: MRI HEAD WITHOUT AND WITH CONTRAST TECHNIQUE: Multiplanar, multiecho pulse sequences of the brain and surrounding structures were obtained without and with intravenous contrast. CONTRAST:  39m GADAVIST GADOBUTROL 1 MMOL/ML IV SOLN COMPARISON:  None. FINDINGS: Brain: There is no acute infarct, acute hemorrhage or extra-axial collection. Normal white matter signal. CSF spaces are normal. No contrast-enhancing lesions. No chronic microhemorrhage. Midline structures are normal. Vascular: Normal flow voids Skull and upper cervical  spine: Normal bone marrow signal. Sinuses/Orbits: Paranasal sinuses and mastoids are clear. Normal orbits. Other: None IMPRESSION: Normal brain MRI. No evidence of intracranial metastatic disease. Electronically Signed   By: KUlyses Jarred  M.D.   On: 08/29/2019 02:07   MR Cervical Spine W Wo Contrast  Result Date: 08/25/2019 CLINICAL DATA:  Back pain with bilateral lower extremity radiculopathy. Breast cancer. Reported history of abnormal lumbar spine MRI EXAM: MRI CERVICAL, THORACIC AND LUMBAR SPINE WITHOUT AND WITH CONTRAST TECHNIQUE: Multiplanar and multiecho pulse sequences of the cervical spine, to include the craniocervical junction and cervicothoracic junction, and thoracic and lumbar spine, were obtained without and with intravenous contrast. CONTRAST:  6 mL Gadavist IV contrast COMPARISON:  No comparison studies are available at the time of dictation FINDINGS: MRI CERVICAL SPINE FINDINGS Alignment: No static listhesis. Straightening of the cervical lordosis. Vertebrae: Rounded enhancing 9 mm lesion within the mid C2 vertebral body. Additional enhancing lesion centered within the left posterior aspect of the C5 vertebral body measuring up to 10 mm. No enhancing soft tissue mass. No evidence of epidural involvement or extension. Cord: Normal signal and morphology. No cord expansion or focal cord lesion. Posterior Fossa, vertebral arteries, paraspinal tissues: Enhancing marrow replacing lesion involving the left occipital condyle measuring 1.8 cm (series 6, image 14). No abnormality within the visualized posterior fossa. Paraspinal soft tissues are within normal limits. Disc levels: No significant disc protrusion, foraminal stenosis, or canal stenosis at any level. Cervical intervertebral discs remain well hydrated with preservation of disc height. Normal cervical facet joints. MRI THORACIC SPINE FINDINGS Alignment: No static listhesis. 4 mm of bony retropulsion related to pathologic fracture of the T6  vertebrae. Vertebrae: Rounded 7 mm enhancing lesion within the anterior aspect of the T4 vertebral body. Small 2-3 mm lesion within the T5 vertebral body. Diffuse metastatic involvement of the T6 level with acute pathologic fracture where there is approximately 50% vertebral body height loss and 4 mm of bony retropulsion. Small 2-3 mm enhancing lesion near the right T8 pedicle. 9 mm enhancing lesion near the left T9 pedicle. 7 mm enhancing metastatic lesion in the anterior aspect of the T10 vertebral body. Multiple enhancing foci within the T12 vertebral body where there is a chronic appearing mild superior endplate compression fracture without residual marrow edema. No enhancing soft tissue mass. No evidence of epidural involvement or extension. There is also a enhancing expansile rib lesion involving the posterior right third rib (series 25, image 7. Rib lesion measures approximately 2.9 x 1.3 cm. Suspect metastatic lesion of the manubrium (series 26, image 12). Cord: Normal signal and morphology. No cord expansion or focal cord lesion. Paraspinal and other soft tissues: No acute findings. Disc levels: Thoracic vertebral body heights are maintained with well-hydrated discs. There is no degenerative disc or facet arthropathy of the lumbar spine. No disc protrusion. Bony retropulsion at T6 contacts the ventral cord without canal stenosis. MRI LUMBAR SPINE FINDINGS Segmentation:  Standard. Alignment: No static listhesis. 4 mm of bony retropulsion related to pathologic fracture of the L1 vertebral body. Vertebrae: Diffuse metastatic lesion throughout the L1 vertebral body with acute pathologic compression fracture with approximately 75% vertebral body height loss centrally and 4 mm of bony retropulsion. Enhancing 9 mm lesion at the superior endplate and right pedicle of L2. A few tiny enhancing foci are evident within the L3 and L5 vertebral bodies. Large enhancing lesion throughout the L4 vertebral body with acute  pathologic fracture and approximately 60% vertebral body height loss centrally. Additional areas of enhancing metastatic disease are seen throughout the sacrum, most predominantly involving the lateral right sacral ala (series 6, image 3) rounded enhancing metastatic lesion within the right ilium adjacent to the right  SI joint (series 7, image 35). Conus medullaris and cauda equina: Conus extends to the L1 level. Conus and cauda equina appear unremarkable without abnormal enhancement. Paraspinal and other soft tissues: Negative. Disc levels: T12-L1: No significant disc protrusion, foraminal stenosis, or canal stenosis. Bony retropulsion of the L1 vertebral body results in mild canal stenosis. L1-L2: No significant disc protrusion, foraminal stenosis, or canal stenosis. L2-L3: No significant disc protrusion, foraminal stenosis, or canal stenosis. L3-L4: Minimal disc bulge without foraminal or canal stenosis. L4-L5: Mild disc bulge, eccentric to the left without evidence of foraminal or canal stenosis. L5-S1: Diffuse disc bulge with a superimposed small central disc protrusion contacting the ventral thecal sac without canal stenosis. Bilateral foramina remain patent. IMPRESSION: 1. 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 There is acute pathologic compression fractures of T6, L1, and L4, as described above. Mild canal stenosis related to retropulsion of the L1 vertebral body. There is no high-grade foraminal or canal stenosis at any spinal level. 2. No evidence of extraosseous spinal soft tissue mass or epidural involvement. 3. Expansile posterior right third rib metastatic lesion. 4. Additional metastatic lesions are seen within the left occipital condyle, manubrium, sacrum, and posterior right ilium. These results will be called to the ordering clinician or representative by the Radiologist Assistant, and communication documented in the PACS or [employer].  Electronically Signed   By: Davina Poke D.O.   On: 08/25/2019 15:25   MR Thoracic Spine W Wo Contrast  Result Date: 08/25/2019 CLINICAL DATA:  Back pain with bilateral lower extremity radiculopathy. Breast cancer. Reported history of abnormal lumbar spine MRI EXAM: MRI CERVICAL, THORACIC AND LUMBAR SPINE WITHOUT AND WITH CONTRAST TECHNIQUE: Multiplanar and multiecho pulse sequences of the cervical spine, to include the craniocervical junction and cervicothoracic junction, and thoracic and lumbar spine, were obtained without and with intravenous contrast. CONTRAST:  6 mL Gadavist IV contrast COMPARISON:  No comparison studies are available at the time of dictation FINDINGS: MRI CERVICAL SPINE FINDINGS Alignment: No static listhesis. Straightening of the cervical lordosis. Vertebrae: Rounded enhancing 9 mm lesion within the mid C2 vertebral body. Additional enhancing lesion centered within the left posterior aspect of the C5 vertebral body measuring up to 10 mm. No enhancing soft tissue mass. No evidence of epidural involvement or extension. Cord: Normal signal and morphology. No cord expansion or focal cord lesion. Posterior Fossa, vertebral arteries, paraspinal tissues: Enhancing marrow replacing lesion involving the left occipital condyle measuring 1.8 cm (series 6, image 14). No abnormality within the visualized posterior fossa. Paraspinal soft tissues are within normal limits. Disc levels: No significant disc protrusion, foraminal stenosis, or canal stenosis at any level. Cervical intervertebral discs remain well hydrated with preservation of disc height. Normal cervical facet joints. MRI THORACIC SPINE FINDINGS Alignment: No static listhesis. 4 mm of bony retropulsion related to pathologic fracture of the T6 vertebrae. Vertebrae: Rounded 7 mm enhancing lesion within the anterior aspect of the T4 vertebral body. Small 2-3 mm lesion within the T5 vertebral body. Diffuse metastatic involvement of the T6  level with acute pathologic fracture where there is approximately 50% vertebral body height loss and 4 mm of bony retropulsion. Small 2-3 mm enhancing lesion near the right T8 pedicle. 9 mm enhancing lesion near the left T9 pedicle. 7 mm enhancing metastatic lesion in the anterior aspect of the T10 vertebral body. Multiple enhancing foci within the T12 vertebral body where there is a chronic appearing mild superior endplate compression fracture without residual  marrow edema. No enhancing soft tissue mass. No evidence of epidural involvement or extension. There is also a enhancing expansile rib lesion involving the posterior right third rib (series 25, image 7. Rib lesion measures approximately 2.9 x 1.3 cm. Suspect metastatic lesion of the manubrium (series 26, image 12). Cord: Normal signal and morphology. No cord expansion or focal cord lesion. Paraspinal and other soft tissues: No acute findings. Disc levels: Thoracic vertebral body heights are maintained with well-hydrated discs. There is no degenerative disc or facet arthropathy of the lumbar spine. No disc protrusion. Bony retropulsion at T6 contacts the ventral cord without canal stenosis. MRI LUMBAR SPINE FINDINGS Segmentation:  Standard. Alignment: No static listhesis. 4 mm of bony retropulsion related to pathologic fracture of the L1 vertebral body. Vertebrae: Diffuse metastatic lesion throughout the L1 vertebral body with acute pathologic compression fracture with approximately 75% vertebral body height loss centrally and 4 mm of bony retropulsion. Enhancing 9 mm lesion at the superior endplate and right pedicle of L2. A few tiny enhancing foci are evident within the L3 and L5 vertebral bodies. Large enhancing lesion throughout the L4 vertebral body with acute pathologic fracture and approximately 60% vertebral body height loss centrally. Additional areas of enhancing metastatic disease are seen throughout the sacrum, most predominantly involving the  lateral right sacral ala (series 6, image 3) rounded enhancing metastatic lesion within the right ilium adjacent to the right SI joint (series 7, image 35). Conus medullaris and cauda equina: Conus extends to the L1 level. Conus and cauda equina appear unremarkable without abnormal enhancement. Paraspinal and other soft tissues: Negative. Disc levels: T12-L1: No significant disc protrusion, foraminal stenosis, or canal stenosis. Bony retropulsion of the L1 vertebral body results in mild canal stenosis. L1-L2: No significant disc protrusion, foraminal stenosis, or canal stenosis. L2-L3: No significant disc protrusion, foraminal stenosis, or canal stenosis. L3-L4: Minimal disc bulge without foraminal or canal stenosis. L4-L5: Mild disc bulge, eccentric to the left without evidence of foraminal or canal stenosis. L5-S1: Diffuse disc bulge with a superimposed small central disc protrusion contacting the ventral thecal sac without canal stenosis. Bilateral foramina remain patent. IMPRESSION: 1. 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 There is acute pathologic compression fractures of T6, L1, and L4, as described above. Mild canal stenosis related to retropulsion of the L1 vertebral body. There is no high-grade foraminal or canal stenosis at any spinal level. 2. No evidence of extraosseous spinal soft tissue mass or epidural involvement. 3. Expansile posterior right third rib metastatic lesion. 4. Additional metastatic lesions are seen within the left occipital condyle, manubrium, sacrum, and posterior right ilium. These results will be called to the ordering clinician or representative by the Radiologist Assistant, and communication documented in the PACS or [employer]. Electronically Signed   By: Duanne Guess D.O.   On: 08/25/2019 15:25   MR Lumbar Spine W Wo Contrast  Result Date: 08/25/2019 CLINICAL DATA:  Back pain with bilateral lower extremity  radiculopathy. Breast cancer. Reported history of abnormal lumbar spine MRI EXAM: MRI CERVICAL, THORACIC AND LUMBAR SPINE WITHOUT AND WITH CONTRAST TECHNIQUE: Multiplanar and multiecho pulse sequences of the cervical spine, to include the craniocervical junction and cervicothoracic junction, and thoracic and lumbar spine, were obtained without and with intravenous contrast. CONTRAST:  6 mL Gadavist IV contrast COMPARISON:  No comparison studies are available at the time of dictation FINDINGS: MRI CERVICAL SPINE FINDINGS Alignment: No static listhesis. Straightening of the cervical lordosis. Vertebrae: Rounded enhancing  9 mm lesion within the mid C2 vertebral body. Additional enhancing lesion centered within the left posterior aspect of the C5 vertebral body measuring up to 10 mm. No enhancing soft tissue mass. No evidence of epidural involvement or extension. Cord: Normal signal and morphology. No cord expansion or focal cord lesion. Posterior Fossa, vertebral arteries, paraspinal tissues: Enhancing marrow replacing lesion involving the left occipital condyle measuring 1.8 cm (series 6, image 14). No abnormality within the visualized posterior fossa. Paraspinal soft tissues are within normal limits. Disc levels: No significant disc protrusion, foraminal stenosis, or canal stenosis at any level. Cervical intervertebral discs remain well hydrated with preservation of disc height. Normal cervical facet joints. MRI THORACIC SPINE FINDINGS Alignment: No static listhesis. 4 mm of bony retropulsion related to pathologic fracture of the T6 vertebrae. Vertebrae: Rounded 7 mm enhancing lesion within the anterior aspect of the T4 vertebral body. Small 2-3 mm lesion within the T5 vertebral body. Diffuse metastatic involvement of the T6 level with acute pathologic fracture where there is approximately 50% vertebral body height loss and 4 mm of bony retropulsion. Small 2-3 mm enhancing lesion near the right T8 pedicle. 9 mm  enhancing lesion near the left T9 pedicle. 7 mm enhancing metastatic lesion in the anterior aspect of the T10 vertebral body. Multiple enhancing foci within the T12 vertebral body where there is a chronic appearing mild superior endplate compression fracture without residual marrow edema. No enhancing soft tissue mass. No evidence of epidural involvement or extension. There is also a enhancing expansile rib lesion involving the posterior right third rib (series 25, image 7. Rib lesion measures approximately 2.9 x 1.3 cm. Suspect metastatic lesion of the manubrium (series 26, image 12). Cord: Normal signal and morphology. No cord expansion or focal cord lesion. Paraspinal and other soft tissues: No acute findings. Disc levels: Thoracic vertebral body heights are maintained with well-hydrated discs. There is no degenerative disc or facet arthropathy of the lumbar spine. No disc protrusion. Bony retropulsion at T6 contacts the ventral cord without canal stenosis. MRI LUMBAR SPINE FINDINGS Segmentation:  Standard. Alignment: No static listhesis. 4 mm of bony retropulsion related to pathologic fracture of the L1 vertebral body. Vertebrae: Diffuse metastatic lesion throughout the L1 vertebral body with acute pathologic compression fracture with approximately 75% vertebral body height loss centrally and 4 mm of bony retropulsion. Enhancing 9 mm lesion at the superior endplate and right pedicle of L2. A few tiny enhancing foci are evident within the L3 and L5 vertebral bodies. Large enhancing lesion throughout the L4 vertebral body with acute pathologic fracture and approximately 60% vertebral body height loss centrally. Additional areas of enhancing metastatic disease are seen throughout the sacrum, most predominantly involving the lateral right sacral ala (series 6, image 3) rounded enhancing metastatic lesion within the right ilium adjacent to the right SI joint (series 7, image 35). Conus medullaris and cauda equina:  Conus extends to the L1 level. Conus and cauda equina appear unremarkable without abnormal enhancement. Paraspinal and other soft tissues: Negative. Disc levels: T12-L1: No significant disc protrusion, foraminal stenosis, or canal stenosis. Bony retropulsion of the L1 vertebral body results in mild canal stenosis. L1-L2: No significant disc protrusion, foraminal stenosis, or canal stenosis. L2-L3: No significant disc protrusion, foraminal stenosis, or canal stenosis. L3-L4: Minimal disc bulge without foraminal or canal stenosis. L4-L5: Mild disc bulge, eccentric to the left without evidence of foraminal or canal stenosis. L5-S1: Diffuse disc bulge with a superimposed small central disc protrusion contacting the ventral  thecal sac without canal stenosis. Bilateral foramina remain patent. IMPRESSION: 1. 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 There is acute pathologic compression fractures of T6, L1, and L4, as described above. Mild canal stenosis related to retropulsion of the L1 vertebral body. There is no high-grade foraminal or canal stenosis at any spinal level. 2. No evidence of extraosseous spinal soft tissue mass or epidural involvement. 3. Expansile posterior right third rib metastatic lesion. 4. Additional metastatic lesions are seen within the left occipital condyle, manubrium, sacrum, and posterior right ilium. These results will be called to the ordering clinician or representative by the Radiologist Assistant, and communication documented in the PACS or [employer]. Electronically Signed   By: Davina Poke D.O.   On: 08/25/2019 15:25   PERIPHERAL VASCULAR CATHETERIZATION  Result Date: 08/28/2019 See op note  NM PET Image Initial (PI) Skull Base To Thigh  Addendum Date: 08/27/2019   ADDENDUM REPORT: 08/27/2019 17:27 ADDENDUM: These results will be called to the ordering clinician or representative by the Radiologist Assistant, and  communication documented in the PACS or [employer]. Electronically Signed   By: Zetta Bills M.D.   On: 08/27/2019 17:27   Result Date: 08/27/2019 CLINICAL DATA:  Initial treatment strategy for breast cancer. EXAM: NUCLEAR MEDICINE PET SKULL BASE TO THIGH TECHNIQUE: 7.05 mCi F-18 FDG was injected intravenously. Full-ring PET imaging was performed from the skull base to thigh after the radiotracer. CT data was obtained and used for attenuation correction and anatomic localization. Fasting blood glucose: 78 mg/dl COMPARISON:  Spinal evaluations of 08/25/2019 FINDINGS: Mediastinal blood pool activity: SUV max 2.72 Liver activity: SUV max not applicable NECK: Asymmetric tonsillar activity RIGHT palatine tonsils approximately select approximately SUV 10.4, on the LEFT 4.5. No hypermetabolic lymph nodes in the neck, see below for thoracic inlet lymph nodes. Incidental CT findings: none CHEST: LEFT supraclavicular lymph node (image 55 of series 4) (SUVmax = 3.7 LEFT axillary lymph node (image 60, series 4) 7 mm (SUVmax = 5.1 with similar size and metabolically active lymph nodes in the area at least 7 additional lymph nodes. Smaller mildly hypermetabolic lymph nodes tracking towards the LEFT supraclavicular region under LEFT subpectoral musculature with a tiny lymph node with the maximum SUV of 2.5 seen on image 57 of series 4 measuring 4 mm. See below for rib lesions.) Asymmetric soft tissue in the LEFT breast and marker for biopsy in an area with a maximum SUV of 7.9 that measures approximately 2.7 cm maximal dimension (image 80, series 4) Contralateral axillary lymph node (SUVmax = 2.6 (image 65, series 4) 6 mm.) Incidental CT findings: Mild calcified atheromatous plaque in the thoracic aorta. Heart size normal without pericardial effusion. Central pulmonary vasculature is normal caliber. Signs of basilar atelectasis question of background ground-glass but no consolidation, no pleural effusion or suspicious  pulmonary nodule. ABDOMEN/PELVIS: No abnormal hypermetabolic activity within the liver, pancreas, adrenal glands, or spleen. No hypermetabolic lymph nodes in the abdomen or pelvis. Bilateral uptake in the adnexa along the expected course of the LEFT and RIGHT ureter is slightly more diffuse than expected for ureteral related activity but given bilateral appearance this is suspected Incidental CT findings: Cystic structures in the RIGHT adnexa without FDG uptake likely ovarian cysts largest measuring approximately 2.7 x 2.4 cm. RIGHT intrarenal calculus. Interpolar RIGHT kidney 4 mm. No acute gastrointestinal process. The appendix is normal. SKELETON: Diffuse skeletal metastatic disease. LEFT C1/skull base lesion with soft tissue at the foramen magnum (image  17, series 4) (SUVmax = 9.2 soft tissue in this area measuring approximately 12 x 12 mm and extending into the occipital condyle on the LEFT likely and also involving C1. C2 metastatic focus markedly hypermetabolic (SUVmax = 9.1 associated with vertebral body destruction measuring approximately 9 x 6 mm. Destructive changes at the C5 level also with marked hypermetabolic activity. Lesion associated with vertebral body destruction at T5-T6 showing marked hypermetabolic activity better demonstrated on the thoracic spine MRI. Pathologic fracture also at the L1 level and with adjacent destruction of L2 is associated with hypermetabolic activity as are multiple other foci in the lumbar spine. Lesion without CT correlate in the RIGHT proximal humerus. Destructive lesion involving the RIGHT second rib (SUVmax = 13.6 signs of metastasis to the sternal body, LEFT anterior third rib, RIGHT hemi sacrum, bilateral iliac bones, LEFT ischium and RIGHT femoral neck with smaller focus in the LEFT femoral shaft. Of particular concern are lesions with bone destruction at the first sacral level within the sacral ala measuring 2.8 x 1.9 cm (SUVmax = 11.5 similar activity with some  bone destruction along the sacroiliac joint measuring 1.4 by 0.8 cm adjacent to the sacral lesion. Another area of considerable concern is the cortical destruction and soft tissue associated with RIGHT femoral neck (image 216, series 4) (SUVmax = 12.9) This is followed by the diffuse activity and cortical destruction noted in the LEFT iliac bone which is associated with extensive periosteal reaction extending from the ischium through the iliac and associated with a small fracture in the nonweightbearing portion of the iliac bone but just above the LEFT acetabulum. (SUVmax = 13.7.) ) Incidental CT findings: none IMPRESSION: 1. 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 Redemonstration of metastatic, pathologic fractures associated with multiple sites of metastatic disease throughout the spine. 2. Sites of concern for future pathologic fracture of the RIGHT sacral ala and RIGHT proximal femur. 3. Pathologic fracture along the anterior aspect of the LEFT iliac bone just above the acetabulum with diffuse iliac and ischial 4. Asymmetric tonsillar/base of tongue activity in the neck is of uncertain significance. Direct clinical inspection or close attention on follow-up is suggested. 5. LEFT breast activity and adenopathy as discussed. 6. Area of activity in the bilateral adnexa favored to represent distal ureters. Attention on follow-up. Correlation could be made with contrasted imaging of the chest, abdomen and pelvis. Electronically Signed: By: Zetta Bills M.D. On: 08/27/2019 17:23   DG C-Arm 1-60 Min  Result Date: 08/29/2019 CLINICAL DATA:  Vertebral augmentation. EXAM: THORACIC SPINE 2 VIEWS; DG C-ARM 1-60 MIN COMPARISON:  MRI 08/25/2019 FINDINGS: Multiple C-arm images are provided. In the lumbar region, vertebral augmentation at L1 has a good appearance. Vertebral augmentation at the L4 level shows methylmethacrylate throughout the vertebral body, and also apparently  based on this 1 projection a large amount within the spinal canal, measuring up to 9 mm in thickness and extending from the superior aspect of L4 down to the superior aspect of L5. This could result in compressive stenosis. Thoracic region imaging shows methylmethacrylate well distributed within the T6 vertebral body. There is probably some extension into the disc space. IMPRESSION: Vertebral augmentation at T6, L1 and L4. At L4, there appears to be ventral epidural cement measuring up to 9 mm in thickness. See above. Electronically Signed   By: Nelson Chimes M.D.   On: 08/29/2019 15:08   DG C-Arm 1-60 Min  Result Date: 08/29/2019 CLINICAL DATA:  Vertebral augmentation. EXAM:  THORACIC SPINE 2 VIEWS; DG C-ARM 1-60 MIN COMPARISON:  MRI 08/25/2019 FINDINGS: Multiple C-arm images are provided. In the lumbar region, vertebral augmentation at L1 has a good appearance. Vertebral augmentation at the L4 level shows methylmethacrylate throughout the vertebral body, and also apparently based on this 1 projection a large amount within the spinal canal, measuring up to 9 mm in thickness and extending from the superior aspect of L4 down to the superior aspect of L5. This could result in compressive stenosis. Thoracic region imaging shows methylmethacrylate well distributed within the T6 vertebral body. There is probably some extension into the disc space. IMPRESSION: Vertebral augmentation at T6, L1 and L4. At L4, there appears to be ventral epidural cement measuring up to 9 mm in thickness. See above. Electronically Signed   By: Nelson Chimes M.D.   On: 08/29/2019 15:08   US BREAST LTD UNI LEFT INC AXILLA  Result Date: 08/07/2019 CLINICAL DATA:  Patient presents for evaluation of palpable abnormality within the left breast. EXAM: DIGITAL DIAGNOSTIC BILATERAL MAMMOGRAM WITH CAD AND TOMO ULTRASOUND BILATERAL BREAST COMPARISON:  Previous exam(s). ACR Breast Density Category c: The breast tissue is heterogeneously dense, which  may obscure small masses. FINDINGS: Underlying the palpable marker within the upper outer left breast is a focal area of masslike distortion. There are a few coarse calcifications associated with this mass. Within the lateral left breast anterior to middle depth there is a 5 mm group of coarse heterogeneous calcifications. Questioned asymmetry within the superior right breast resolved with additional imaging. Mammographic images were processed with CAD. On physical exam, there is a firm mass within the upper-outer left breast. Targeted ultrasound is performed, showing a 3.1 x 1.9 x 2.9 cm lobular hypoechoic mass left breast 1 o'clock position 4 cm from nipple corresponding with mass on mammogram. At least 4 abnormal cortically thickened left axillary lymph nodes are demonstrated measuring up to 5 mm. Dense tissue is demonstrated within the superior right breast. No suspicious mass is identified. IMPRESSION: 1. Suspicious palpable left breast mass 1 o'clock position. 2. Multiple (at least 4) cortically thickened left axillary lymph nodes. 3. Suspicious calcifications outer left breast middle depth. RECOMMENDATION: 1. Ultrasound-guided core needle biopsy left breast mass 1 o'clock position. 2. Ultrasound-guided core needle biopsy of 1 of the cortically thickened left axillary lymph nodes. 3. Stereotactic guided core needle biopsy of the coarse heterogeneous calcifications within the outer left breast. I have discussed the findings and recommendations with the patient. If applicable, a reminder letter will be sent to the patient regarding the next appointment. BI-RADS CATEGORY  5: Highly suggestive of malignancy. Electronically Signed   By: Lovey Newcomer M.D.   On: 08/07/2019 12:06   US BREAST LTD UNI RIGHT INC AXILLA  Result Date: 08/07/2019 CLINICAL DATA:  Patient presents for evaluation of palpable abnormality within the left breast. EXAM: DIGITAL DIAGNOSTIC BILATERAL MAMMOGRAM WITH CAD AND TOMO ULTRASOUND  BILATERAL BREAST COMPARISON:  Previous exam(s). ACR Breast Density Category c: The breast tissue is heterogeneously dense, which may obscure small masses. FINDINGS: Underlying the palpable marker within the upper outer left breast is a focal area of masslike distortion. There are a few coarse calcifications associated with this mass. Within the lateral left breast anterior to middle depth there is a 5 mm group of coarse heterogeneous calcifications. Questioned asymmetry within the superior right breast resolved with additional imaging. Mammographic images were processed with CAD. On physical exam, there is a firm mass within the upper-outer left breast. Targeted  ultrasound is performed, showing a 3.1 x 1.9 x 2.9 cm lobular hypoechoic mass left breast 1 o'clock position 4 cm from nipple corresponding with mass on mammogram. At least 4 abnormal cortically thickened left axillary lymph nodes are demonstrated measuring up to 5 mm. Dense tissue is demonstrated within the superior right breast. No suspicious mass is identified. IMPRESSION: 1. Suspicious palpable left breast mass 1 o'clock position. 2. Multiple (at least 4) cortically thickened left axillary lymph nodes. 3. Suspicious calcifications outer left breast middle depth. RECOMMENDATION: 1. Ultrasound-guided core needle biopsy left breast mass 1 o'clock position. 2. Ultrasound-guided core needle biopsy of 1 of the cortically thickened left axillary lymph nodes. 3. Stereotactic guided core needle biopsy of the coarse heterogeneous calcifications within the outer left breast. I have discussed the findings and recommendations with the patient. If applicable, a reminder letter will be sent to the patient regarding the next appointment. BI-RADS CATEGORY  5: Highly suggestive of malignancy. Electronically Signed   By: Annia Belt M.D.   On: 08/07/2019 12:06   ECHOCARDIOGRAM COMPLETE  Result Date: 08/26/2019    ECHOCARDIOGRAM REPORT   Patient Name:   Sophia [name]  [facility] Date of Exam: 08/26/2019 Medical Rec #:  [id number]             Height:       61.0 in Accession #:    [id number]            Weight:       139.2 lb Date of Birth:  July 06, 1980             BSA:          1.619 m Patient Age:    38 years              BP:           112/54 mmHg Patient Gender: F                     HR:           94 bpm. Exam Location:  [facility] Procedure: 2D Echo, Color Doppler and Cardiac Doppler Indications:     v58.11 Chemotherapy evaluation  History:         Patient has no prior history of Echocardiogram examinations.                  Vertigo.  Sonographer:     Humphrey Rolls RDCS (AE) Referring Phys:  [id number] Pete Glatter Fouad Taul Diagnosing Phys: Cristal Deer End MD  Sonographer Comments: Suboptimal subcostal window. IMPRESSIONS  1. Left ventricular ejection fraction, by estimation, is 65 to 70%. The left ventricle has normal function. The left ventricle has no regional wall motion abnormalities. Left ventricular diastolic parameters are indeterminate.  2. Right ventricular systolic function is normal. The right ventricular size is normal.  3. The mitral valve is normal in structure. No evidence of mitral valve regurgitation. No evidence of mitral stenosis.  4. The aortic valve is grossly normal. Aortic valve regurgitation is not visualized. No aortic stenosis is present.  5. The inferior vena cava is normal in size with greater than 50% respiratory variability, suggesting right atrial pressure of 3 mmHg. FINDINGS  Left Ventricle: Left ventricular ejection fraction, by estimation, is 65 to 70%. The left ventricle has normal function. The left ventricle has no regional wall motion abnormalities. The left ventricular internal cavity size was normal in size. There is  no left ventricular hypertrophy. Left ventricular diastolic parameters are  indeterminate. Right Ventricle: The right ventricular size is normal. No increase in right ventricular wall thickness. Right ventricular systolic function is normal. Left  Atrium: Left atrial size was normal in size. Right Atrium: Right atrial size was not well visualized. Pericardium: The pericardium was not well visualized. Mitral Valve: The mitral valve is normal in structure. No evidence of mitral valve regurgitation. No evidence of mitral valve stenosis. MV peak gradient, 3.2 mmHg. The mean mitral valve gradient is 1.0 mmHg. Tricuspid Valve: The tricuspid valve is not well visualized. Tricuspid valve regurgitation is not demonstrated. Aortic Valve: The aortic valve is grossly normal. Aortic valve regurgitation is not visualized. No aortic stenosis is present. Aortic valve mean gradient measures 3.0 mmHg. Aortic valve peak gradient measures 5.3 mmHg. Aortic valve area, by VTI measures 1.60 cm. Pulmonic Valve: The pulmonic valve was not well visualized. Pulmonic valve regurgitation is not visualized. No evidence of pulmonic stenosis. Aorta: The aortic root is normal in size and structure. Pulmonary Artery: The pulmonary artery is of normal size. Venous: The inferior vena cava is normal in size with greater than 50% respiratory variability, suggesting right atrial pressure of 3 mmHg. IAS/Shunts: The interatrial septum was not well visualized.  LEFT VENTRICLE PLAX 2D LVIDd:         4.21 cm  Diastology LVIDs:         2.58 cm  LV e' lateral:   9.25 cm/s LV PW:         0.82 cm  LV E/e' lateral: 6.7 LV IVS:        0.69 cm  LV e' medial:    7.51 cm/s LVOT diam:     1.60 cm  LV E/e' medial:  8.2 LV SV:         29 LV SV Index:   18 LVOT Area:     2.01 cm  LEFT ATRIUM             Index LA diam:        2.60 cm 1.61 cm/m LA Vol (A2C):   20.5 ml 12.66 ml/m LA Vol (A4C):   29.3 ml 18.10 ml/m LA Biplane Vol: 26.1 ml 16.12 ml/m  AORTIC VALVE                   PULMONIC VALVE AV Area (Vmax):    1.74 cm    PV Vmax:       1.08 m/s AV Area (Vmean):   1.69 cm    PV Vmean:      76.800 cm/s AV Area (VTI):     1.60 cm    PV VTI:        0.202 m AV Vmax:           115.00 cm/s PV Peak grad:  4.7 mmHg  AV Vmean:          75.100 cm/s PV Mean grad:  3.0 mmHg AV VTI:            0.179 m AV Peak Grad:      5.3 mmHg AV Mean Grad:      3.0 mmHg LVOT Vmax:         99.80 cm/s LVOT Vmean:        63.000 cm/s LVOT VTI:          0.142 m LVOT/AV VTI ratio: 0.79  AORTA Ao Root diam: 2.60 cm MITRAL VALVE MV Area (PHT): 6.17 cm    SHUNTS MV Peak grad:  3.2 mmHg  Systemic VTI:  0.14 m MV Mean grad:  1.0 mmHg    Systemic Diam: 1.60 cm MV Vmax:       0.89 m/s MV Vmean:      58.1 cm/s MV Decel Time: 123 msec MV E velocity: 61.60 cm/s MV A velocity: 77.60 cm/s MV E/A ratio:  0.79 Harrell Gave End MD Electronically signed by Nelva Bush MD Signature Date/Time: 08/26/2019/1:28:27 PM    Final    MS DIGITAL DIAG TOMO BILAT  Result Date: 08/07/2019 CLINICAL DATA:  Patient presents for evaluation of palpable abnormality within the left breast. EXAM: DIGITAL DIAGNOSTIC BILATERAL MAMMOGRAM WITH CAD AND TOMO ULTRASOUND BILATERAL BREAST COMPARISON:  Previous exam(s). ACR Breast Density Category c: The breast tissue is heterogeneously dense, which may obscure small masses. FINDINGS: Underlying the palpable marker within the upper outer left breast is a focal area of masslike distortion. There are a few coarse calcifications associated with this mass. Within the lateral left breast anterior to middle depth there is a 5 mm group of coarse heterogeneous calcifications. Questioned asymmetry within the superior right breast resolved with additional imaging. Mammographic images were processed with CAD. On physical exam, there is a firm mass within the upper-outer left breast. Targeted ultrasound is performed, showing a 3.1 x 1.9 x 2.9 cm lobular hypoechoic mass left breast 1 o'clock position 4 cm from nipple corresponding with mass on mammogram. At least 4 abnormal cortically thickened left axillary lymph nodes are demonstrated measuring up to 5 mm. Dense tissue is demonstrated within the superior right breast. No suspicious mass is identified.  IMPRESSION: 1. Suspicious palpable left breast mass 1 o'clock position. 2. Multiple (at least 4) cortically thickened left axillary lymph nodes. 3. Suspicious calcifications outer left breast middle depth. RECOMMENDATION: 1. Ultrasound-guided core needle biopsy left breast mass 1 o'clock position. 2. Ultrasound-guided core needle biopsy of 1 of the cortically thickened left axillary lymph nodes. 3. Stereotactic guided core needle biopsy of the coarse heterogeneous calcifications within the outer left breast. I have discussed the findings and recommendations with the patient. If applicable, a reminder letter will be sent to the patient regarding the next appointment. BI-RADS CATEGORY  5: Highly suggestive of malignancy. Electronically Signed   By: Lovey Newcomer M.D.   On: 08/07/2019 12:06   MS DIGITAL DIAG UNI LEFT  Addendum Date: 08/20/2019   ADDENDUM REPORT: 08/20/2019 09:06 ADDENDUM: PATHOLOGY revealed: A. BREAST, LEFT AT 1:00; ULTRASOUND-GUIDED CORE NEEDLE BIOPSY: - INVASIVE MAMMARY CARCINOMA, NO SPECIAL TYPE. At least 9 mm in this sample. Grade 2. Ductal carcinoma in situ: Not identified. Lymphovascular invasion: Not identified. B. AXILLA, LEFT; ULTRASOUND-GUIDED CORE NEEDLE BIOPSY: - INVASIVE MAMMARY CARCINOMA, NO SPECIAL TYPE. Comment: Invasive carcinoma within this specimen measures approximately 1 mm in greatest extent, and is histologically similar to tumor identified in part A. No definitive lymph node tissue is identified. C. BREAST, LEFT UPPER OUTER QUADRANT; STEREOTACTIC CORE NEEDLE 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 CARCINOMA IN SITU, HIGH GRADE WITH COMEDONECROSIS, WITH ASSOCIATED CALCIFICATIONS. Comment: The specimen consists predominantly of ductal carcinoma in situ, present in 7 of 8 tissue blocks, and measuring up to 2 mm in greatest linear extent. DCIS is associated with calcifications. Invasive carcinoma is identified within a single block (C7), measuring 1 mm in  greatest extent, and is histologically similar to tumor seen in parts A and B. Hormone receptor testing will be limited to specimen A. Pathology results are CONCORDANT with imaging findings, per Dr. Audie Pinto. Breast MRI is  recommended for extent of disease. Multiple attempts to contact patient, using [employer], were unsuccessful. Notified Tanya Nones RN at [facility] (who is patient's Furniture conservator/restorer) and she will contact patient with biopsy results and arrange surgical referral. Addendum by Electa Sniff RN on 08/20/2019. Electronically Signed   By: Audie Pinto M.D.   On: 08/20/2019 09:06   Result Date: 08/20/2019 CLINICAL DATA:  39 year old female presenting for biopsy of a left breast mass, left breast calcifications and a left axillary lymph node. EXAM: DIAGNOSTIC LEFT MAMMOGRAM POST ULTRASOUND AND STEREOTACTIC BIOPSIES COMPARISON:  Previous exam(s). FINDINGS: Mammographic images were obtained following ultrasound guided biopsy of a left breast mass at 1 o'clock. The coil biopsy marking clip is in expected position at the site of biopsy. Mammographic images were obtained following ultrasound guided biopsy of a left axillary lymph node. The [facility] biopsy marking clip is in expected position at the site of biopsy. Mammographic images were obtained following stereotactic guided biopsy of calcifications. The X biopsy marking clip is in expected position at the site of biopsy. IMPRESSION: Appropriate positioning of the coil shaped biopsy marking clip at the site of biopsy in the left breast 1 o'clock Appropriate positioning of the X shaped biopsy marking clip at the site of biopsy in the upper outer left breast. Appropriate positioning of the [facility] shaped biopsy marking clip at the site of biopsy in the left axilla. Final Assessment: Post Procedure Mammograms for Marker Placement Electronically Signed: By: Audie Pinto M.D. On: 08/14/2019 09:36   DG Hip  Unilat W or Wo Pelvis 2-3 Views Right  Result Date: 08/28/2019 CLINICAL DATA:  Right hip pain. History of metastatic breast cancer. EXAM: DG HIP (WITH OR WITHOUT PELVIS) 2-3V RIGHT COMPARISON:  None. FINDINGS: There is no evidence of hip fracture or dislocation. There is no evidence of arthropathy. A 4 mm round sclerotic focus is seen within the inter trochanteric region of the proximal right femur. IMPRESSION: Small sclerotic focus within the proximal right femur which may represent a small bone island. Given the patient's history of metastatic breast cancer, a small metastatic focus cannot be excluded. Electronically Signed   By: Virgina Norfolk M.D.   On: 08/28/2019 21:01   MM LT BREAST BX W LOC DEV 1ST LESION IMAGE BX SPEC STEREO GUIDE  Addendum Date: 08/20/2019   ADDENDUM REPORT: 08/20/2019 09:06 ADDENDUM: PATHOLOGY revealed: A. BREAST, LEFT AT 1:00; ULTRASOUND-GUIDED CORE NEEDLE BIOPSY: - INVASIVE MAMMARY CARCINOMA, NO SPECIAL TYPE. At least 9 mm in this sample. Grade 2. Ductal carcinoma in situ: Not identified. Lymphovascular invasion: Not identified. B. AXILLA, LEFT; ULTRASOUND-GUIDED CORE NEEDLE BIOPSY: - INVASIVE MAMMARY CARCINOMA, NO SPECIAL TYPE. Comment: Invasive carcinoma within this specimen measures approximately 1 mm in greatest extent, and is histologically similar to tumor identified in part A. No definitive lymph node tissue is identified. C. BREAST, LEFT UPPER OUTER QUADRANT; STEREOTACTIC CORE NEEDLE 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 CARCINOMA IN SITU, HIGH GRADE WITH COMEDONECROSIS, WITH ASSOCIATED CALCIFICATIONS. Comment: The specimen consists predominantly of ductal carcinoma in situ, present in 7 of 8 tissue blocks, and measuring up to 2 mm in greatest linear extent. DCIS is associated with calcifications. Invasive carcinoma is identified within a single block (C7), measuring 1 mm in greatest extent, and is histologically similar to tumor seen in parts A  and B. Hormone receptor testing will be limited to specimen A. Pathology results are CONCORDANT with imaging findings, per Dr. Audie Pinto. Breast MRI  is recommended for extent of disease. Multiple attempts to contact patient, using [employer], were unsuccessful. Notified Tanya Nones RN at [facility] (who is patient's Furniture conservator/restorer) and she will contact patient with biopsy results and arrange surgical referral. Addendum by Electa Sniff RN on 08/20/2019. Electronically Signed   By: Audie Pinto M.D.   On: 08/20/2019 09:06   Result Date: 08/20/2019 CLINICAL DATA:  38 year old female presenting for biopsy of a left breast mass, left axillary lymph node and left breast calcifications. EXAM: LEFT BREAST STEREOTACTIC CORE NEEDLE BIOPSY COMPARISON:  Previous exams. FINDINGS: The patient and I discussed the procedure of stereotactic-guided biopsy including benefits and alternatives. We discussed the high likelihood of a successful procedure. We discussed the risks of the procedure including infection, bleeding, tissue injury, clip migration, and inadequate sampling. Informed written consent was given. The usual time out protocol was performed immediately prior to the procedure. Using sterile technique and 1% Lidocaine as local anesthetic, under stereotactic guidance, a 9 gauge vacuum assisted device was used to perform core needle biopsy of calcifications in the upper outer left breast using a superior approach. Specimen radiograph was performed showing at least 4 specimens with calcifications. Specimens with calcifications are identified for pathology. Lesion quadrant: Upper outer quadrant At the conclusion of the procedure, an X tissue marker clip was deployed into the biopsy cavity. Follow-up 2-view mammogram was performed and dictated separately. IMPRESSION: Stereotactic-guided biopsy of calcifications in the upper outer left breast. No apparent complications. Electronically  Signed: By: Audie Pinto M.D. On: 08/14/2019 09:43   [place] LT BREAST BX W LOC DEV 1ST LESION IMG BX SPEC US GUIDE  Addendum Date: 08/20/2019   ADDENDUM REPORT: 08/20/2019 09:04 ADDENDUM: PATHOLOGY revealed: A. BREAST, LEFT AT 1:00; ULTRASOUND-GUIDED CORE NEEDLE BIOPSY: - INVASIVE MAMMARY CARCINOMA, NO SPECIAL TYPE. At least 9 mm in this sample. Grade 2. Ductal carcinoma in situ: Not identified. Lymphovascular invasion: Not identified. B. AXILLA, LEFT; ULTRASOUND-GUIDED CORE NEEDLE BIOPSY: - INVASIVE MAMMARY CARCINOMA, NO SPECIAL TYPE. Comment: Invasive carcinoma within this specimen measures approximately 1 mm in greatest extent, and is histologically similar to tumor identified in part A. No definitive lymph node tissue is identified. C. BREAST, LEFT UPPER OUTER QUADRANT; STEREOTACTIC CORE NEEDLE 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 CARCINOMA IN SITU, HIGH GRADE WITH COMEDONECROSIS, WITH ASSOCIATED CALCIFICATIONS. Comment: The specimen consists predominantly of ductal carcinoma in situ, present in 7 of 8 tissue blocks, and measuring up to 2 mm in greatest linear extent. DCIS is associated with calcifications. Invasive carcinoma is identified within a single block (C7), measuring 1 mm in greatest extent, and is histologically similar to tumor seen in parts A and B. Hormone receptor testing will be limited to specimen A. Pathology results are CONCORDANT with imaging findings, per Dr. Audie Pinto. Breast MRI is recommended for extent of disease. Multiple attempts to contact patient, using [employer], were unsuccessful. Notified Tanya Nones RN at [facility] (who is patient's Furniture conservator/restorer) and she will contact patient with biopsy results and arrange surgical referral. Addendum by Electa Sniff RN on 08/20/2019. Electronically Signed   By: Audie Pinto M.D.   On: 08/20/2019 09:04   Result Date: 08/20/2019 CLINICAL DATA:  39 year old female  presenting for biopsy of a left breast mass, left axillary lymph node and left breast calcifications. EXAM: ULTRASOUND GUIDED LEFT BREAST CORE NEEDLE BIOPSY [place] AXILLARY NODE CORE BIOPSY LEFT COMPARISON:  Previous exam(s). PROCEDURE: I met with the patient  and we discussed the procedure of ultrasound-guided biopsy, including benefits and alternatives. We discussed the high likelihood of a successful procedure. We discussed the risks of the procedure, including infection, bleeding, tissue injury, clip migration, and inadequate sampling. Informed written consent was given. The usual time-out protocol was performed immediately prior to the procedure. 1.  Lesion quadrant: Upper outer quadrant Using sterile technique and 1% Lidocaine as local anesthetic, under direct ultrasound visualization, a 12 gauge spring-loaded device was used to perform biopsy of a mass in the left breast at 1 o'clock using a lateral approach. At the conclusion of the procedure a coil tissue marker clip was deployed into the biopsy cavity. Follow up 2 view mammogram was performed and dictated separately. 2.  Axilla Using sterile technique and 1% Lidocaine as local anesthetic, under direct ultrasound visualization, a 14 gauge spring-loaded device was used to perform biopsy of a left axillary lymph node using a lateral approach. At the conclusion of the procedure a HydroMARK tissue marker clip was deployed into the biopsy cavity. Follow up 2 view mammogram was performed and dictated separately. IMPRESSION: Ultrasound guided biopsy of a left breast mass at 1 o'clock and a left axillary lymph node. No apparent complications. Electronically Signed: By: Audie Pinto M.D. On: 08/14/2019 09:42   [place] LT BREAST BX W LOC DEV EA ADD LESION IMG BX SPEC US GUIDE  Addendum Date: 08/20/2019   ADDENDUM REPORT: 08/20/2019 09:04 ADDENDUM: PATHOLOGY revealed: A. BREAST, LEFT AT 1:00; ULTRASOUND-GUIDED CORE NEEDLE BIOPSY: - INVASIVE MAMMARY CARCINOMA, NO SPECIAL  TYPE. At least 9 mm in this sample. Grade 2. Ductal carcinoma in situ: Not identified. Lymphovascular invasion: Not identified. B. AXILLA, LEFT; ULTRASOUND-GUIDED CORE NEEDLE BIOPSY: - INVASIVE MAMMARY CARCINOMA, NO SPECIAL TYPE. Comment: Invasive carcinoma within this specimen measures approximately 1 mm in greatest extent, and is histologically similar to tumor identified in part A. No definitive lymph node tissue is identified. C. BREAST, LEFT UPPER OUTER QUADRANT; STEREOTACTIC CORE NEEDLE 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 CARCINOMA IN SITU, HIGH GRADE WITH COMEDONECROSIS, WITH ASSOCIATED CALCIFICATIONS. Comment: The specimen consists predominantly of ductal carcinoma in situ, present in 7 of 8 tissue blocks, and measuring up to 2 mm in greatest linear extent. DCIS is associated with calcifications. Invasive carcinoma is identified within a single block (C7), measuring 1 mm in greatest extent, and is histologically similar to tumor seen in parts A and B. Hormone receptor testing will be limited to specimen A. Pathology results are CONCORDANT with imaging findings, per Dr. Audie Pinto. Breast MRI is recommended for extent of disease. Multiple attempts to contact patient, using [employer], were unsuccessful. Notified Tanya Nones RN at [facility] (who is patient's Furniture conservator/restorer) and she will contact patient with biopsy results and arrange surgical referral. Addendum by Electa Sniff RN on 08/20/2019. Electronically Signed   By: Audie Pinto M.D.   On: 08/20/2019 09:04   Result Date: 08/20/2019 CLINICAL DATA:  39 year old female presenting for biopsy of a left breast mass, left axillary lymph node and left breast calcifications. EXAM: ULTRASOUND GUIDED LEFT BREAST CORE NEEDLE BIOPSY [place] AXILLARY NODE CORE BIOPSY LEFT COMPARISON:  Previous exam(s). PROCEDURE: I met with the patient and we discussed the procedure of ultrasound-guided biopsy,  including benefits and alternatives. We discussed the high likelihood of a successful procedure. We discussed the risks of the procedure, including infection, bleeding, tissue injury, clip migration, and inadequate sampling. Informed written consent was given. The usual time-out protocol  was performed immediately prior to the procedure. 1.  Lesion quadrant: Upper outer quadrant Using sterile technique and 1% Lidocaine as local anesthetic, under direct ultrasound visualization, a 12 gauge spring-loaded device was used to perform biopsy of a mass in the left breast at 1 o'clock using a lateral approach. At the conclusion of the procedure a coil tissue marker clip was deployed into the biopsy cavity. Follow up 2 view mammogram was performed and dictated separately. 2.  Axilla Using sterile technique and 1% Lidocaine as local anesthetic, under direct ultrasound visualization, a 14 gauge spring-loaded device was used to perform biopsy of a left axillary lymph node using a lateral approach. At the conclusion of the procedure a HydroMARK tissue marker clip was deployed into the biopsy cavity. Follow up 2 view mammogram was performed and dictated separately. IMPRESSION: Ultrasound guided biopsy of a left breast mass at 1 o'clock and a left axillary lymph node. No apparent complications. Electronically Signed: By: Audie Pinto M.D. On: 08/14/2019 09:42   MM Outside Films Mammo  Result Date: 08/08/2019 This examination belongs to an outside facility and is stored here for comparison purposes only.  Contact the originating outside institution for any associated report or interpretation.   Assessment and plan- Patient is a 39 y.o. female with newly diagnosed metastatic breast cancer ER/PR negative HER-2 positive with widespread bone metastases  1.  Patient is s/p kyphoplasty at T6 L1 and L4 level.  Appreciate Dr. Rudene Christians seeing her and getting the kyphoplasty done in a timely fashion.  Hopefully this will help her  with her pain and improve ambulation.  Plan is for possible prophylactic fixation of the right hip as well as RFA to the sacral lesion tomorrow.  Continue Decadron 8 mg every 12.  She is also on gabapentin and oxycodone 5 mg every 4 as needed with the backup of as needed IV fentanyl.  Patient does not wish to make any changes to her pain medication yet.  2.  From a breast cancer standpoint patient already has a port in place and I will tentatively plan to give her first cycle of Herceptin and Perjeta in 5 days time either as an inpatient or outpatient.  I will hold off on giving her chemotherapy for another 2 to 3 weeks while she heals from her kyphoplasty.  I have explained to the patient the results of the MRI as well as PET scan and MRI brain.  She comprehends it well and is agreeable to start Herceptin and Perjeta next week  3.  I will get in touch with Dr. Donella Stade as she would also benefit from palliative radiation treatment as an outpatient.  4.  Disposition: Out of bed, PT OT and hopefully home with home physical therapy when medically stable We will continue to follow    Visit Diagnosis 1.  Metastatic breast cancer 2.  Goals of care/counseling discussion 3.  Neoplasm related pain  Dr. Randa Evens, MD, MPH [facility] [phone] 08/30/2019 3:50 PM

## 2019-08-30 NOTE — Progress Notes (Signed)
Subjective: Patient reporting pain around the front of her abdomen probably L1 distribution.  She had a great deal of difficulty walking and wants to lean backwards.   Objective: Vital signs in last 24 hours: Temp:  [97.9 F (36.6 C)-99 F (37.2 C)] 97.9 F (36.6 C) (06/12 0820) Pulse Rate:  [65-84] 75 (06/12 0820) Resp:  [11-23] 18 (06/12 0820) BP: (87-129)/(36-89) 124/89 (06/12 0820) SpO2:  [94 %-99 %] 97 % (06/12 0820)  Intake/Output from previous day: 06/11 0701 - 06/12 0700 In: 1150 [I.V.:1100; IV Piggyback:50] Out: 350 [Urine:350] Intake/Output this shift: No intake/output data recorded.  Recent Labs    08/28/19 2044 08/29/19 1857  HGB 14.3 14.6   Recent Labs    08/28/19 2044 08/29/19 1857  WBC 16.7* 27.6*  RBC 4.88 4.85  HCT 41.2 42.7  PLT 446* 420*   Recent Labs    08/28/19 2044 08/30/19 0614  NA 133* 136  K 3.8 4.0  CL 101 102  CO2 24 24  BUN 21* 12  CREATININE 0.61 0.53  GLUCOSE 130* 123*  CALCIUM 8.8* 8.6*   No results for input(s): LABPT, INR in the last 72 hours.  Neurologically intact ABD soft She is tender at the right SI area on exam today   Assessment/Plan: Status post multiple level RFA and kyphoplasty's T6, L1 and L4, with residual pain.  Will recheck tomorrow and if she still is having a great deal of difficulty walking would plan on sacral and iliac bone lesion radiofrequency ablation and cement augmentation As well as prophylactic fixation of right hip with RFA of lesion.  Hessie Knows 08/30/2019, 1:22 PM

## 2019-08-30 NOTE — Evaluation (Addendum)
Physical Therapy Evaluation Patient Details Name: Sophia Sophia [name] MRN: [id number] DOB: 1980-07-19 Today's Date: 08/30/2019   History of Present Illness  39 yo female with onset of severe back pain was admitted, found mult pathological spinal fractures after recent dx with primary breast CA.  Had kyphoplasty on 08/29/19, for T6, L1 and L4.  Pain not relieved by procedure, circumferential on abdomen and back.  Had leukocytosis, anxiety and not also R femoral site of mets.      Clinical Impression  Pt was seen for mobility on RW and noted her struggle with upright posture.  Her tendency is to list backward, and cannot sit in a chair due to pain with flexion.  Her chair posture required 4 pillows to support in mainly reclined position.  Follow up with her to work on LE strength and increase control of standing balance to lend support to gait.  Rehab is requested but given younger age of pt, Sophia [name] work toward dc to home.    Follow Up Recommendations SNF;Home health PT;Supervision/Assistance - 24 hour;Supervision for mobility/OOB    Equipment Recommendations  Rolling walker with 5" wheels    Recommendations for Other Services       Precautions / Restrictions Precautions Precautions: Fall;Back Precaution Booklet Issued: No Precaution Comments: verbal instructions Restrictions Weight Bearing Restrictions: No      Mobility  Bed Mobility Overal bed mobility: Needs Assistance             General bed mobility comments: pt was up when PT arrived but nsg reports assisting out due to pain.  Transfers Overall transfer level: Needs assistance Equipment used: Rolling walker (2 wheeled);1 person hand held assist Transfers: Sit to/from Stand Sit to Stand: Mod assist         General transfer comment: mod to control descent but hips are weak  Ambulation/Gait Ambulation/Gait assistance: Min assist Gait Distance (Feet): 30 Feet Assistive device: Rolling walker (2 wheeled);1 person  hand held assist Gait Pattern/deviations: Step-through pattern;Wide base of support;Shuffle;Decreased stride length;Decreased stance time - right Gait velocity: reduced   General Gait Details: pt is unsteady and leaning posteriorly in standing  Stairs Stairs:  (deferred)          Wheelchair Mobility    Modified Rankin (Stroke Patients Only)       Balance Overall balance assessment: Needs assistance Sitting-balance support: Feet supported;Single extremity supported Sitting balance-Leahy Scale: Fair     Standing balance support: Bilateral upper extremity supported;During functional activity Standing balance-Leahy Scale: Poor Standing balance comment: poor with assist to avoid listing backward on walker                             Pertinent Vitals/Pain Pain Assessment: Faces Faces Pain Scale: Hurts whole lot Pain Location: back to abdomen circumferentially Pain Descriptors / Indicators: Grimacing;Guarding Pain Intervention(s): Limited activity within patient's tolerance;Monitored during session;Repositioned;RN gave pain meds during session    [place] expects to be discharged to:: Private residence Living Arrangements: Spouse/significant other Available Help at Discharge: Family;Friend(s);Available 24 hours/day Type of Home: House Home Access: Level entry     Home Layout: One level Home Equipment: None Additional Comments: fully independent and drove before pain started    Prior Function Level of Independence: Independent               Hand Dominance   Dominant Hand: Right    Extremity/Trunk Assessment   Upper Extremity Assessment Upper Extremity Assessment:  Overall WFL for tasks assessed    Lower Extremity Assessment Lower Extremity Assessment: Generalized weakness    Cervical / Trunk Assessment Cervical / Trunk Assessment: Other exceptions (new kyphoplastiesT6, L1, L4)  Communication   Communication: Prefers  language other than [place];No difficulties (pt spoke English to PT but translate medical information)  Cognition Arousal/Alertness: Awake/alert Behavior During Therapy: WFL for tasks assessed/performed Overall Cognitive Status: Within Functional Limits for tasks assessed                                 General Comments: pt was able to give history of her prior level      General Comments General comments (skin integrity, edema, etc.): pt is in pain and struggling to manage with standing and walking.  Her efforts are assisted by PT and nursing but pt is not good with UE support to control balance.      Exercises     Assessment/Plan    PT Assessment Patient needs continued PT services  PT Problem List Decreased strength;Decreased range of motion;Decreased activity tolerance;Decreased balance;Decreased mobility;Decreased coordination;Decreased knowledge of use of DME;Pain       PT Treatment Interventions DME instruction;Gait training;Functional mobility training;Therapeutic activities;Balance training;Therapeutic exercise;Neuromuscular re-education;Patient/family education    PT Goals (Current goals can be found in the Care Plan section)  Acute Rehab PT Goals Patient Stated Goal: to hurt less PT Goal Formulation: With patient Time For Goal Achievement: 09/13/19 Potential to Achieve Goals: Good    Frequency 7X/week   Barriers to discharge   Sophia [name] need assistance 24/7 to manage at home    Co-evaluation               AM-PAC PT "6 Clicks" Mobility  Outcome Measure Help needed turning from your back to your side while in a flat bed without using bedrails?: A Little Help needed moving from lying on your back to sitting on the side of a flat bed without using bedrails?: A Lot Help needed moving to and from a bed to a chair (including a wheelchair)?: A Lot Help needed standing up from a chair using your arms (e.g., wheelchair or bedside chair)?: A Lot Help needed  to walk in hospital room?: A Little Help needed climbing 3-5 steps with a railing? : Total 6 Click Score: 13    End of Session   Activity Tolerance: Patient limited by pain;Treatment limited secondary to medical complications (Comment) Patient left: in chair;with call bell/phone within reach;with nursing/sitter in room Nurse Communication: Mobility status;Other (comment) (plan for rehab request) PT Visit Diagnosis: Unsteadiness on feet (R26.81);Pain;Muscle weakness (generalized) (M62.81);Other abnormalities of gait and mobility (R26.89);Difficulty in walking, not elsewhere classified (R26.2) Pain - Right/Left:  (abdomen and spine) Pain - part of body:  (back and abdomen)    Time: 3810-1751 PT Time Calculation (min) (ACUTE ONLY): 16 min   Charges:   PT Evaluation $PT Eval Moderate Complexity: 1 Mod         Ramond Dial 08/30/2019, 11:16 AM  Mee Hives, PT MS Acute Rehab Dept. Number: [place] and [place]

## 2019-08-31 LAB — BASIC METABOLIC PANEL
Anion gap: 8 (ref 5–15)
BUN: 11 mg/dL (ref 6–20)
CO2: 25 mmol/L (ref 22–32)
Calcium: 8.3 mg/dL — ABNORMAL LOW (ref 8.9–10.3)
Chloride: 103 mmol/L (ref 98–111)
Creatinine, Ser: 0.4 mg/dL — ABNORMAL LOW (ref 0.44–1.00)
GFR calc Af Amer: >60 mL/min (ref >60)
GFR calc non Af Amer: >60 mL/min (ref >60)
Glucose, Bld: 92 mg/dL (ref 70–99)
Potassium: 3.5 mmol/L (ref 3.5–5.1)
Sodium: 136 mmol/L (ref 135–145)

## 2019-08-31 LAB — CBC
HCT: 29.5 % — ABNORMAL LOW (ref 36.0–46.0)
Hemoglobin: 9.2 g/dL — ABNORMAL LOW (ref 12.0–15.0)
MCH: 28.6 pg (ref 26.0–34.0)
MCHC: 31.2 g/dL (ref 30.0–36.0)
MCV: 91.6 fL (ref 80.0–100.0)
Platelets: 175 10*3/uL (ref 150–400)
RBC: 3.22 MIL/uL — ABNORMAL LOW (ref 3.87–5.11)
RDW: 17.2 % — ABNORMAL HIGH (ref 11.5–15.5)
WBC: 10.3 10*3/uL (ref 4.0–10.5)
nRBC: 0 % (ref 0.0–0.2)

## 2019-08-31 LAB — TSH: TSH: 0.085 u[IU]/mL — ABNORMAL LOW (ref 0.350–4.500)

## 2019-08-31 LAB — MAGNESIUM: Magnesium: 2 mg/dL (ref 1.7–2.4)

## 2019-08-31 MED ORDER — POLYETHYLENE GLYCOL 3350 17 G PO PACK
17.0000 g | PACK | Freq: Every day | ORAL | Status: AC
  Administered 2019-08-31 – 2019-09-02 (×2): 17 g via ORAL
  Filled 2019-08-31 (×2): qty 1

## 2019-08-31 MED ORDER — CEFAZOLIN SODIUM-DEXTROSE 1-4 GM/50ML-% IV SOLN
1.0000 g | Freq: Once | INTRAVENOUS | Status: AC
  Administered 2019-09-01: 2 g via INTRAVENOUS
  Filled 2019-08-31: qty 50

## 2019-08-31 NOTE — Progress Notes (Signed)
Physical Therapy Treatment Patient Details Name: Sophia [name] MRN: [id number] DOB: 11-10-1980 Today's Date: 08/31/2019    History of Present Illness 38 yo female with onset of severe back pain was admitted, found mult pathological spinal fractures from mets after recent dx with primary breast CA.  Had kyphoplasty on 08/29/19, for T6, L1 and L4.  Pain now relieved by procedure, but residual pain on R hip from mets.  Resolved leukocytosis, but ongoing issues of weakness and discomfort from mets in R hip and leg.    PT Comments    Pt continues to have R hip and minor spinal pain, with walking distances improving.  Her plan is not to consider HHPT currently but to have a rehab stay, given her frequent rests during walking, the need for assistance with all mobility and ongoing discomfort.  Her attitude is excellent and should make good progress in rehab to get home with safer gait.  Pt is struggling with mobility to manage clothing and bathroom trips, will benefit from OT referral either in SNF or in hosp to assist with dressing tasks.  Continue to work on strength and balance as tolerated and as her bone discomfort permits.  Follow Up Recommendations  SNF     Equipment Recommendations  Rolling walker with 5" wheels    Recommendations for Other Services       Precautions / Restrictions Precautions Precautions: Fall;Back Precaution Booklet Issued: Yes (comment) Precaution Comments: reviewed back precautions with pt Restrictions Weight Bearing Restrictions: No    Mobility  Bed Mobility Overal bed mobility: Needs Assistance Bed Mobility: Sidelying to Sit;Rolling Rolling: Min assist Sidelying to sit: Min assist       General bed mobility comments: min assist to control her trunk and lift up  Transfers Overall transfer level: Needs assistance Equipment used: Rolling walker (2 wheeled);1 person hand held assist Transfers: Sit to/from Stand Sit to Stand: Min assist          General transfer comment: min to power up and control balance  Ambulation/Gait Ambulation/Gait assistance: Min guard Gait Distance (Feet): 140 Feet Assistive device: Rolling walker (2 wheeled);1 person hand held assist Gait Pattern/deviations: Step-through pattern;Wide base of support;Decreased weight shift to right Gait velocity: reduced Gait velocity interpretation: <1.31 ft/sec, indicative of household ambulator General Gait Details: better able to control balance, has reduced tolerance for RLE WB due to hip feeling weak but is able to walk without LOB   Stairs             Wheelchair Mobility    Modified Rankin (Stroke Patients Only)       Balance     Sitting balance-Leahy Scale: Fair     Standing balance support: Bilateral upper extremity supported;During functional activity Standing balance-Leahy Scale: Poor Standing balance comment: no backward listing today but definitely feels better with spine extended                            Cognition Arousal/Alertness: Awake/alert Behavior During Therapy: WFL for tasks assessed/performed Overall Cognitive Status: Within Functional Limits for tasks assessed                                        Exercises      General Comments General comments (skin integrity, edema, etc.): postural support reviewed, pt is understanding how to prop in sitting to sit  without pain      Pertinent Vitals/Pain Pain Assessment: Faces Faces Pain Scale: Hurts a little bit Pain Location: minor soreness in back and R hip Pain Descriptors / Indicators: Tender Pain Intervention(s): Monitored during session;Repositioned    Home Living                      Prior Function            PT Goals (current goals can now be found in the care plan section) Acute Rehab PT Goals Patient Stated Goal: to hurt less Progress towards PT goals: Progressing toward goals    Frequency    7X/week      PT  Plan Current plan remains appropriate    Co-evaluation              AM-PAC PT "6 Clicks" Mobility   Outcome Measure  Help needed turning from your back to your side while in a flat bed without using bedrails?: A Little Help needed moving from lying on your back to sitting on the side of a flat bed without using bedrails?: A Little Help needed moving to and from a bed to a chair (including a wheelchair)?: A Little Help needed standing up from a chair using your arms (e.g., wheelchair or bedside chair)?: A Little Help needed to walk in hospital room?: A Little Help needed climbing 3-5 steps with a railing? : A Lot 6 Click Score: 17    End of Session Equipment Utilized During Treatment: Gait belt Activity Tolerance: Patient limited by fatigue;Treatment limited secondary to medical complications (Comment) Patient left: in chair;with call bell/phone within reach;with chair alarm set Nurse Communication: Mobility status PT Visit Diagnosis: Unsteadiness on feet (R26.81);Pain;Muscle weakness (generalized) (M62.81);Other abnormalities of gait and mobility (R26.89);Difficulty in walking, not elsewhere classified (R26.2)     Time: 1055-1140 PT Time Calculation (min) (ACUTE ONLY): 45 min  Charges:  $Gait Training: 8-22 mins $Therapeutic Activity: 23-37 mins               Ramond Dial 08/31/2019, 2:16 PM  Mee Hives, PT MS Acute Rehab Dept. Number: [place]

## 2019-08-31 NOTE — Progress Notes (Signed)
PROGRESS NOTE    Sophia [name]  BHA:[id number] DOB: Oct 29, 1980 DOA: 08/28/2019 PCP: [facility] Pat[facility], NP    Chief Complaint  Patient presents with  . Back Pain    Brief Narrative:  Chief Complaint: Pathological fractures, sent in by oncology  HPI: Sophia [name] is a 39 y.o. 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  Patient was seen by Dr. Janese Banks on 6 4:20 AM was placed on oxycodone and dexamethasone.  Patient had port placement by Dr. Lucky Cowboy early today.  The emergency room provider, Dr. Jimmye Norman spoke with oncologist, Dr. Janese Banks, neurosurgeon Dr. Cari Caraway and orthopedist Dr. Rudene Christians with decision for kyphoplasty.  Work-up in the ER otherwise notable for WBC of 16,000,. hospitalist consulted for admission.   Subjective:  Sitting up in chair, reports pain is fairly controlled currently C/o hip pain, pelvic pain ,bandlike , worse with activity, but she did walk with PT this morning Denies back pain at rest  Report good appetite  Assessment & Plan:   Principal Problem:   Multiple pathological fractures Active Problems:   Primary cancer of left breast with metastasis to other site [facility])   Anxiety   Preoperative clearance   Leukocytosis   Metastatic breast cancer (HCC)   Goals of care, counseling/discussion   Neoplasm related pain  Multiple pathologic compression fractures -Neurosurgery consulted, recommend kyphoplasty with ablation as she is not a good candidate for instrumented fusion -Decadron 8 mg twice daily for 10 days from 6/4 to 6/14 and Ativan for anxiety -Plan for radiofrequency ablation of tumor with  kyphoplasty at the current fractured levels of T6 L1 and L4. -Pain control  Right proximal femur lesion, may need to be treated with radiofrequency ablation with prophylactic fixation, will follow Ortho recommendation  Right sacrum and ilium tumor, Ortho recommend radiofrequency ablation with cement augmentation  Left acetabular lesion , does not appear symptomatic  Leukocytosis, steroid-induced  Transaminitis; unclear etiology , PET scan no abnormal hypermetabolic activity within the  Liver Repeat lft back down to normal range  hepatitis panel negative    DVT prophylaxis: Lovenox  Code Status: Full Family Communication: Patient is a single mother with 3 children no immediate family in the [place], friends updated at bedside with permission on 6/11 Disposition:   Status is: Inpatient    Dispo: The patient is from: Home              Anticipated d/c is to: Home vs SNF              Anticipated d/c date is: TBD, Pending on pain control, Ortho and oncology clearance                Consultants:  Orthopedic Oncology Neurosurgery  Procedures:   Radiofrequency ablation with kyphoplasty  Antimicrobials:   none    Objective: Vitals:   08/30/19 1941 08/30/19 2327 08/31/19 0827 08/31/19 1143  BP: 122/89 123/74 (!) 132/91 128/84  Pulse: 97 64 65 95  Resp: 20 19 18 20   Temp: 98.1 F (36.7 C) 98.1 F (36.7 C) 98 F (36.7 C) 98.2 F (36.8 C)  TempSrc:   Oral Oral  SpO2: 99% 97% 99% 99%  Weight:  Height:       No intake or output data in the 24 hours ending 08/31/19 1201 Filed Weights   08/28/19 1938  Weight: 60.8 kg    Examination:  General exam: calm, NAD Respiratory system: Clear to auscultation. Respiratory effort normal. Cardiovascular system: S1 & S2 heard, RRR. No JVD, no murmur, No pedal edema. Gastrointestinal system: Abdomen is nondistended, soft and nontender. No organomegaly or masses felt. Normal bowel sounds heard. Central nervous system: Alert and  oriented. No focal neurological deficits. Extremities: Symmetric 5 x 5 power. Skin: No rashes, lesions or ulcers Psychiatry: Judgement and insight appear normal. Mood & affect appropriate.     Data Reviewed: I have personally reviewed following labs and imaging studies  CBC: Recent Labs  Lab 08/28/19 2044 08/29/19 1857 08/31/19 0443  WBC 16.7* 27.6* 10.3  NEUTROABS 15.0* 23.6*  --   HGB 14.3 14.6 9.2*  HCT 41.2 42.7 29.5*  MCV 84.4 88.0 91.6  PLT 446* 420* 229    Basic Metabolic Panel: Recent Labs  Lab 08/28/19 2044 08/30/19 0614 08/31/19 0730  NA 133* 136 136  K 3.8 4.0 3.5  CL 101 102 103  CO2 24 24 25   GLUCOSE 130* 123* 92  BUN 21* 12 11  CREATININE 0.61 0.53 0.40*  CALCIUM 8.8* 8.6* 8.3*  MG  --   --  2.0    GFR: Estimated Creatinine Clearance: 78.2 mL/min (A) (by C-G formula based on SCr of 0.4 mg/dL (L)).  Liver Function Tests: Recent Labs  Lab 08/28/19 2044 08/30/19 0614  AST 46* 21  ALT 68* 39  ALKPHOS 99 100  BILITOT 0.6 0.5  PROT 7.1 6.1*  ALBUMIN 3.4* 3.0*    CBG: Recent Labs  Lab 08/27/19 1224  GLUCAP 78     Recent Results (from the past 240 hour(s))  SARS CORONAVIRUS 2 (TAT 6-24 HRS) Nasopharyngeal Nasopharyngeal Swab     Status: None   Collection Time: 08/26/19  1:27 PM   Specimen: Nasopharyngeal Swab  Result Value Ref Range Status   SARS Coronavirus 2 NEGATIVE NEGATIVE Final    Comment: (NOTE) SARS-CoV-2 target nucleic acids are NOT DETECTED. The SARS-CoV-2 RNA is generally detectable in upper and lower respiratory specimens during the acute phase of infection. Negative results do not preclude SARS-CoV-2 infection, do not rule out co-infections with other pathogens, and should not be used as the sole basis for treatment or other patient management decisions. Negative results must be combined with clinical observations, patient history, and epidemiological information. The expected result is Negative. Fact Sheet for  Patients: SugarRoll.be Fact Sheet for Healthcare Providers: https://www.woods-mathews.com/ This test is not yet approved or cleared by the [place] FDA and  has been authorized for detection and/or diagnosis of SARS-CoV-2 by FDA under an Emergency Use Authorization (EUA). This EUA will remain  in effect (meaning this test can be used) for the duration of the COVID-19 declaration under Section 56 4(b)(1) of the Act, 21 U.S.C. section 360bbb-3(b)(1), unless the authorization is terminated or revoked sooner. Performed at [facility], [place] [address] [place], [place] [address]   SARS Coronavirus 2 by RT PCR (hospital order, performed in [facility] hospital lab) Nasopharyngeal Nasopharyngeal Swab     Status: None   Collection Time: 08/29/19  4:12 AM   Specimen: Nasopharyngeal Swab  Result Value Ref Range Status   SARS Coronavirus 2 NEGATIVE NEGATIVE Final    Comment: (NOTE) SARS-CoV-2 target nucleic acids are NOT DETECTED.  The SARS-CoV-2 RNA  is generally detectable in upper and lower respiratory specimens during the acute phase of infection. The lowest concentration of SARS-CoV-2 viral copies this assay can detect is 250 copies / mL. A negative result does not preclude SARS-CoV-2 infection and should not be used as the sole basis for treatment or other patient management decisions.  A negative result may occur with improper specimen collection / handling, submission of specimen other than nasopharyngeal swab, presence of viral mutation(s) within the areas targeted by this assay, and inadequate number of viral copies (<250 copies / mL). A negative result must be combined with clinical observations, patient history, and epidemiological information.  Fact Sheet for Patients:   StrictlyIdeas.no  Fact Sheet for Healthcare Providers: BankingDealers.co.za  This test is not yet approved or   cleared by the [place] FDA and has been authorized for detection and/or diagnosis of SARS-CoV-2 by FDA under an Emergency Use Authorization (EUA).  This EUA will remain in effect (meaning this test can be used) for the duration of the COVID-19 declaration under Section 564(b)(1) of the Act, 21 U.S.C. section 360bbb-3(b)(1), unless the authorization is terminated or revoked sooner.  Performed at [facility], [address], [place], [place] [address]          Radiology Studies: DG Thoracic Spine 2 View  Result Date: 08/29/2019 CLINICAL DATA:  Vertebral augmentation. EXAM: THORACIC SPINE 2 VIEWS; DG C-ARM 1-60 MIN COMPARISON:  MRI 08/25/2019 FINDINGS: Multiple C-arm images are provided. In the lumbar region, vertebral augmentation at L1 has a good appearance. Vertebral augmentation at the L4 level shows methylmethacrylate throughout the vertebral body, and also apparently based on this 1 projection a large amount within the spinal canal, measuring up to 9 mm in thickness and extending from the superior aspect of L4 down to the superior aspect of L5. This could result in compressive stenosis. Thoracic region imaging shows methylmethacrylate well distributed within the T6 vertebral body. There is probably some extension into the disc space. IMPRESSION: Vertebral augmentation at T6, L1 and L4. At L4, there appears to be ventral epidural cement measuring up to 9 mm in thickness. See above. Electronically Signed   By: Nelson Chimes M.D.   On: 08/29/2019 15:08   DG C-Arm 1-60 Min  Result Date: 08/29/2019 CLINICAL DATA:  Vertebral augmentation. EXAM: THORACIC SPINE 2 VIEWS; DG C-ARM 1-60 MIN COMPARISON:  MRI 08/25/2019 FINDINGS: Multiple C-arm images are provided. In the lumbar region, vertebral augmentation at L1 has a good appearance. Vertebral augmentation at the L4 level shows methylmethacrylate throughout the vertebral body, and also apparently based on this 1 projection a large  amount within the spinal canal, measuring up to 9 mm in thickness and extending from the superior aspect of L4 down to the superior aspect of L5. This could result in compressive stenosis. Thoracic region imaging shows methylmethacrylate well distributed within the T6 vertebral body. There is probably some extension into the disc space. IMPRESSION: Vertebral augmentation at T6, L1 and L4. At L4, there appears to be ventral epidural cement measuring up to 9 mm in thickness. See above. Electronically Signed   By: Nelson Chimes M.D.   On: 08/29/2019 15:08   DG C-Arm 1-60 Min  Result Date: 08/29/2019 CLINICAL DATA:  Vertebral augmentation. EXAM: THORACIC SPINE 2 VIEWS; DG C-ARM 1-60 MIN COMPARISON:  MRI 08/25/2019 FINDINGS: Multiple C-arm images are provided. In the lumbar region, vertebral augmentation at L1 has a good appearance. Vertebral augmentation at the L4 level shows methylmethacrylate throughout the  vertebral body, and also apparently based on this 1 projection a large amount within the spinal canal, measuring up to 9 mm in thickness and extending from the superior aspect of L4 down to the superior aspect of L5. This could result in compressive stenosis. Thoracic region imaging shows methylmethacrylate well distributed within the T6 vertebral body. There is probably some extension into the disc space. IMPRESSION: Vertebral augmentation at T6, L1 and L4. At L4, there appears to be ventral epidural cement measuring up to 9 mm in thickness. See above. Electronically Signed   By: Nelson Chimes M.D.   On: 08/29/2019 15:08        Scheduled Meds: . Chlorhexidine Gluconate Cloth  6 each Topical Daily  . dexamethasone (DECADRON) injection  8 mg Intravenous Q12H  . enoxaparin (LOVENOX) injection  40 mg Subcutaneous Q24H  . gabapentin  300 mg Oral BID  . polyethylene glycol  17 g Oral Daily  . senna-docusate  1 tablet Oral BID   Continuous Infusions: . sodium chloride 75 mL/hr at 08/31/19 0929      LOS: 2 days     Time spent: 32mins I have personally reviewed and interpreted on  08/31/2019 daily labs, tele strips, imagings as discussed above under date review session and assessment and plans.  I reviewed all nursing notes, pharmacy notes, consultant notes,  vitals, pertinent old records  I have discussed plan of care as described above with RN , patient on 08/31/2019  Voice Recognition /Dragon dictation system was used to create this note, attempts have been made to correct errors. Please contact the author with questions and/or clarifications.   Florencia Reasons, MD PhD FACP [facility]  Available via Epic secure chat 7am-7pm for nonurgent issues Please page for urgent issues To page the attending provider between 7A-7P or the covering provider during after hours 7P-7A, please log into the web site [email or web address] and access using universal [facility] password for that web site. If you do not have the password, please call the hospital operator.    08/31/2019, 12:01 PM

## 2019-08-31 NOTE — Progress Notes (Signed)
Subjective: Continues to have pain in the right buttock area consistent with her sacral iliac lesions.  Additionally she has the proximal femur lesion that is worrisome for impending fracture.  She was able to move somewhat better today with physical therapy.   Objective: Vital signs in last 24 hours: Temp:  [98 F (36.7 C)-98.2 F (36.8 C)] 98.2 F (36.8 C) (06/13 1143) Pulse Rate:  [64-97] 95 (06/13 1143) Resp:  [18-20] 20 (06/13 1143) BP: (122-132)/(74-91) 128/84 (06/13 1143) SpO2:  [97 %-99 %] 99 % (06/13 1143)  Intake/Output from previous day: No intake/output data recorded. Intake/Output this shift: No intake/output data recorded.  Recent Labs    08/28/19 2044 08/29/19 1857 08/31/19 0443  HGB 14.3 14.6 9.2*   Recent Labs    08/29/19 1857 08/31/19 0443  WBC 27.6* 10.3  RBC 4.85 3.22*  HCT 42.7 29.5*  PLT 420* 175   Recent Labs    08/30/19 0614 08/31/19 0730  NA 136 136  K 4.0 3.5  CL 102 103  CO2 24 25  BUN 12 11  CREATININE 0.53 0.40*  GLUCOSE 123* 92  CALCIUM 8.6* 8.3*   No results for input(s): LABPT, INR in the last 72 hours.  Neurologically intact She has slight pain with hip rotation on the right.Tender in the right SI area  Assessment/Plan: Multiple site metastatic disease.  Discussed treatment options and with her back pain I think it is reasonable to proceed with radiofrequency ablation and cement augmentation of sacrum and iliac crest lesions.  Additionally would fix right hip with prophylactic rodding with radiofrequency ablation to the tumor prior to placement of metal rod and screws. Plan on doing that tomorrow.   Hessie Knows 08/31/2019, 12:36 PM

## 2019-09-01 ENCOUNTER — Inpatient Hospital Stay: Payer: Self-pay

## 2019-09-01 ENCOUNTER — Encounter: Payer: Self-pay | Admitting: Orthopedic Surgery

## 2019-09-01 ENCOUNTER — Inpatient Hospital Stay: Payer: Self-pay | Admitting: Registered Nurse

## 2019-09-01 ENCOUNTER — Encounter
Admission: EM | Disposition: A | Discharge status: Home Health | Payer: Self-pay | Source: Home / Self Care | Attending: Internal Medicine

## 2019-09-01 ENCOUNTER — Ambulatory Visit: Admission: RE | Admit: 2019-09-01 | Payer: Self-pay | Source: Ambulatory Visit

## 2019-09-01 DIAGNOSIS — D72829 Elevated white blood cell count, unspecified: Secondary | ICD-10-CM

## 2019-09-01 DIAGNOSIS — F419 Anxiety disorder, unspecified: Secondary | ICD-10-CM

## 2019-09-01 HISTORY — PX: INTRAMEDULLARY (IM) NAIL INTERTROCHANTERIC: SHX5875

## 2019-09-01 HISTORY — PX: KYPHOPLASTY: SHX5884

## 2019-09-01 LAB — CBC
HCT: 39.9 % (ref 36.0–46.0)
Hemoglobin: 13.4 g/dL (ref 12.0–15.0)
MCH: 29.5 pg (ref 26.0–34.0)
MCHC: 33.6 g/dL (ref 30.0–36.0)
MCV: 87.7 fL (ref 80.0–100.0)
Platelets: 341 10*3/uL (ref 150–400)
RBC: 4.55 MIL/uL (ref 3.87–5.11)
RDW: 13.2 % (ref 11.5–15.5)
WBC: 37.6 10*3/uL — ABNORMAL HIGH (ref 4.0–10.5)
nRBC: 0 % (ref 0.0–0.2)

## 2019-09-01 LAB — CBC WITH DIFFERENTIAL/PLATELET
Abs Immature Granulocytes: 1.58 10*3/uL — ABNORMAL HIGH (ref 0.00–0.07)
Basophils Absolute: 0.1 10*3/uL (ref 0.0–0.1)
Basophils Relative: 1 %
Eosinophils Absolute: 0 10*3/uL (ref 0.0–0.5)
Eosinophils Relative: 0 %
HCT: 39.2 % (ref 36.0–46.0)
Hemoglobin: 13.4 g/dL (ref 12.0–15.0)
Immature Granulocytes: 7 %
Lymphocytes Relative: 10 %
Lymphs Abs: 2.1 10*3/uL (ref 0.7–4.0)
MCH: 29.6 pg (ref 26.0–34.0)
MCHC: 34.2 g/dL (ref 30.0–36.0)
MCV: 86.7 fL (ref 80.0–100.0)
Monocytes Absolute: 1.1 10*3/uL — ABNORMAL HIGH (ref 0.1–1.0)
Monocytes Relative: 5 %
Neutro Abs: 16.4 10*3/uL — ABNORMAL HIGH (ref 1.7–7.7)
Neutrophils Relative %: 77 %
Platelets: 389 10*3/uL (ref 150–400)
RBC: 4.52 MIL/uL (ref 3.87–5.11)
RDW: 13.1 % (ref 11.5–15.5)
Smear Review: NORMAL
WBC: 21.3 10*3/uL — ABNORMAL HIGH (ref 4.0–10.5)
nRBC: 0 % (ref 0.0–0.2)

## 2019-09-01 LAB — BASIC METABOLIC PANEL
Anion gap: 8 (ref 5–15)
BUN: 12 mg/dL (ref 6–20)
CO2: 26 mmol/L (ref 22–32)
Calcium: 8.6 mg/dL — ABNORMAL LOW (ref 8.9–10.3)
Chloride: 103 mmol/L (ref 98–111)
Creatinine, Ser: 0.38 mg/dL — ABNORMAL LOW (ref 0.44–1.00)
GFR calc Af Amer: >60 mL/min (ref >60)
GFR calc non Af Amer: >60 mL/min (ref >60)
Glucose, Bld: 101 mg/dL — ABNORMAL HIGH (ref 70–99)
Potassium: 3.8 mmol/L (ref 3.5–5.1)
Sodium: 137 mmol/L (ref 135–145)

## 2019-09-01 LAB — CREATININE, SERUM
Creatinine, Ser: 0.71 mg/dL (ref 0.44–1.00)
GFR calc Af Amer: >60 mL/min (ref >60)
GFR calc non Af Amer: >60 mL/min (ref >60)

## 2019-09-01 IMAGING — XA DG FEMUR 2+V*R*
15 of 24 series · 15 of 24 positions shown · non-contrast
Comparison: CT dated [DATE]

CLINICAL DATA: Intramedullary nail insertion right femur secondary
to metastatic disease and pinning fracture.

EXAM:
DG C-ARM 1-60 MIN; RIGHT FEMUR 2 VIEWS
FLUOROSCOPY TIME:  Fluoroscopy Time:  3 minutes 45 seconds

[Series 1: cont. · 1 of 1 slices shown (1 of 15)]
[im 1/1]
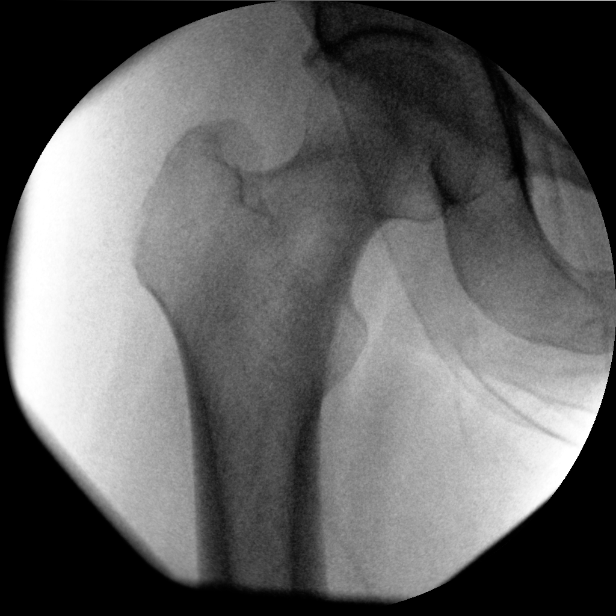

[Series 3: cont. · 1 of 1 slices shown (2 of 15)]
[im 1/1]
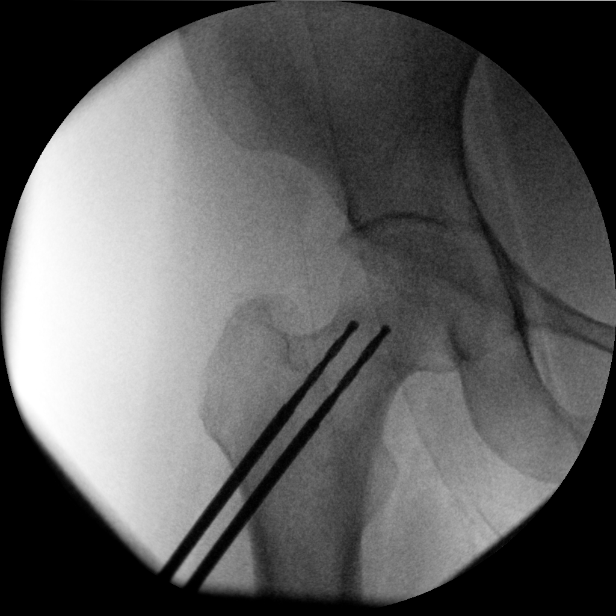

[Series 5: cont. · 1 of 1 slices shown (3 of 15)]
[im 1/1]
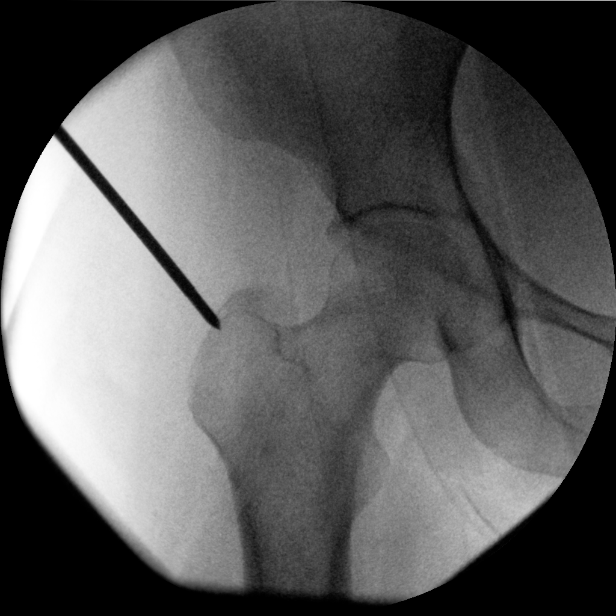

[Series 6: cont. · 1 of 1 slices shown (4 of 15)]
[im 1/1]
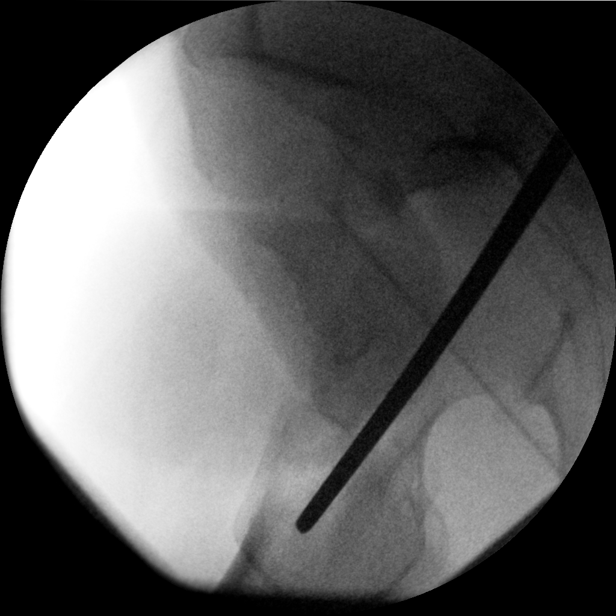

[Series 8: cont. · 1 of 1 slices shown (5 of 15)]
[im 1/1]
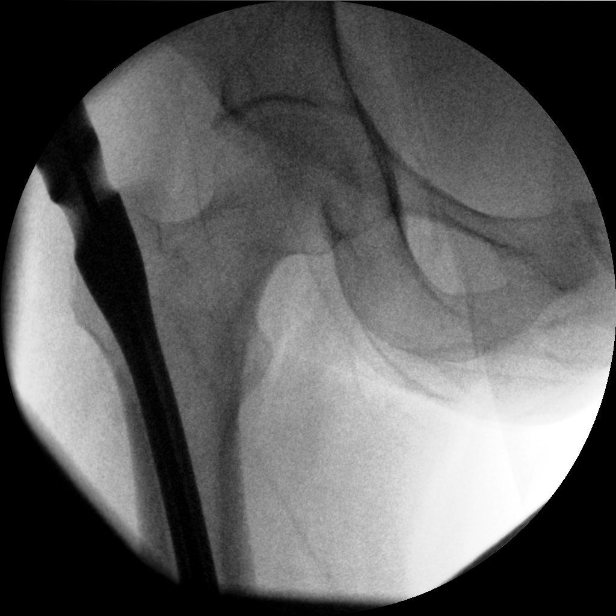

[Series 9: cont. · 1 of 1 slices shown (6 of 15)]
[im 1/1]
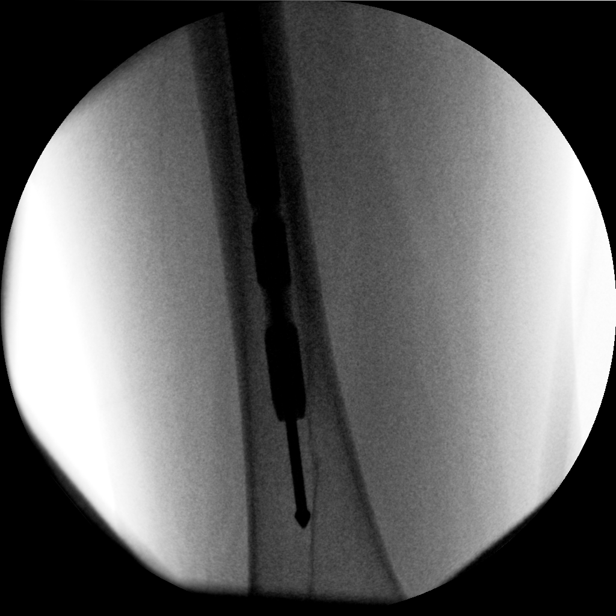

[Series 11: cont. · 1 of 1 slices shown (7 of 15)]
[im 1/1]
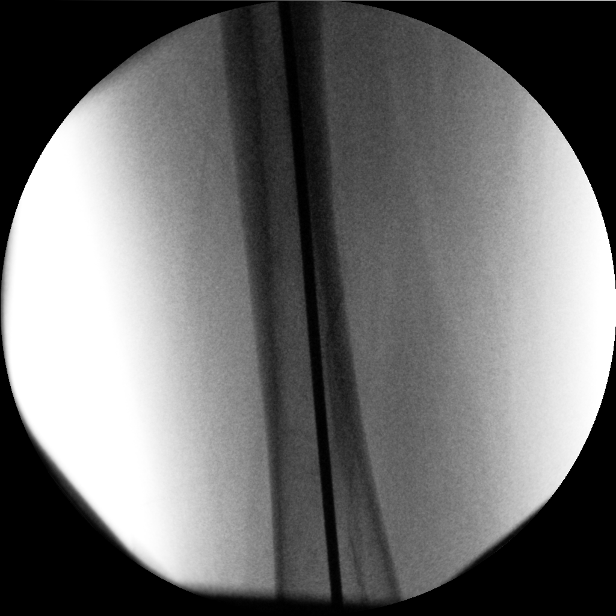

[Series 13: cont. · 1 of 1 slices shown (8 of 15)]
[im 1/1]
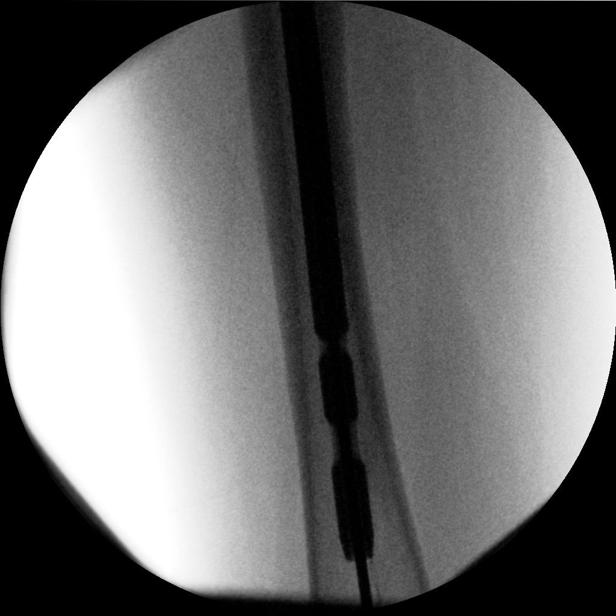

[Series 14: cont. · 1 of 1 slices shown (9 of 15)]
[im 1/1]
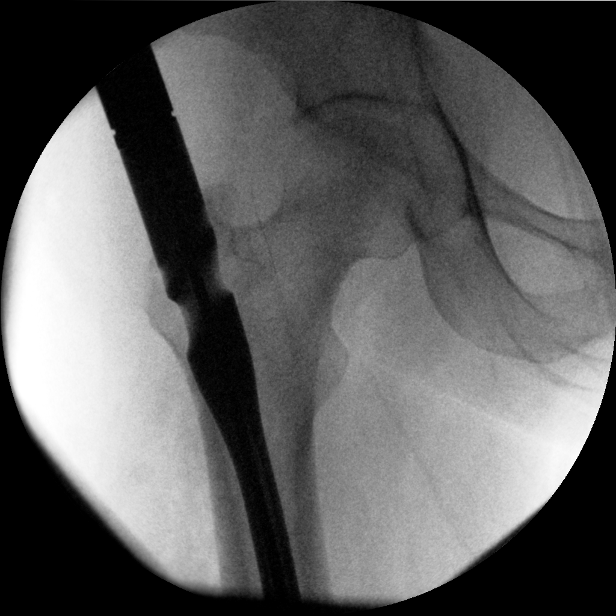

[Series 16: cont. · 1 of 1 slices shown (10 of 15)]
[im 1/1]
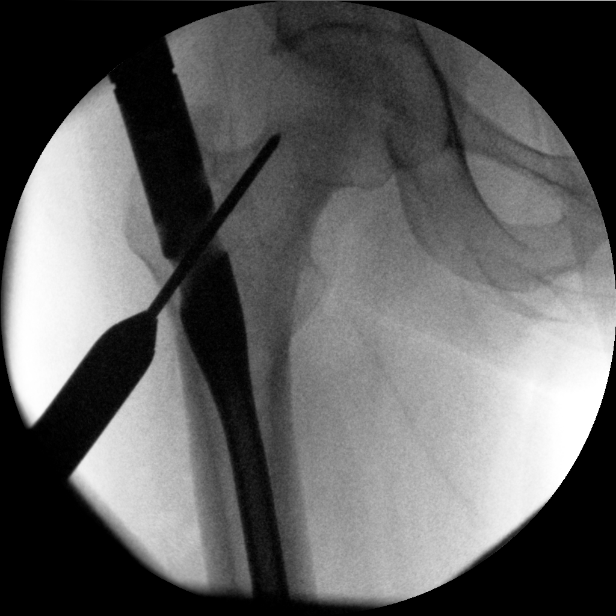

[Series 17: cont. · 1 of 1 slices shown (11 of 15)]
[im 1/1]
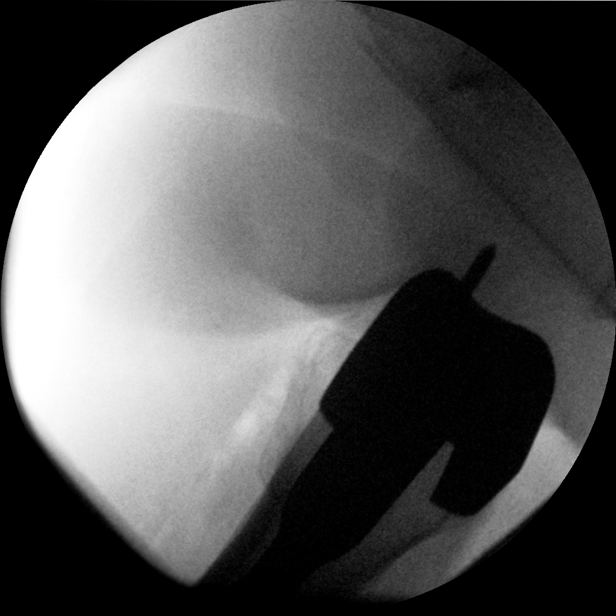

[Series 19: cont. · 1 of 1 slices shown (12 of 15)]
[im 1/1]
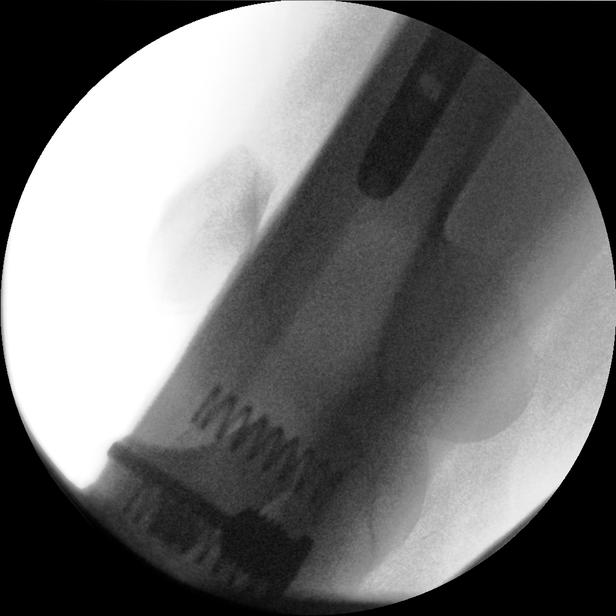

[Series 21: cont. · 1 of 1 slices shown (13 of 15)]
[im 1/1]
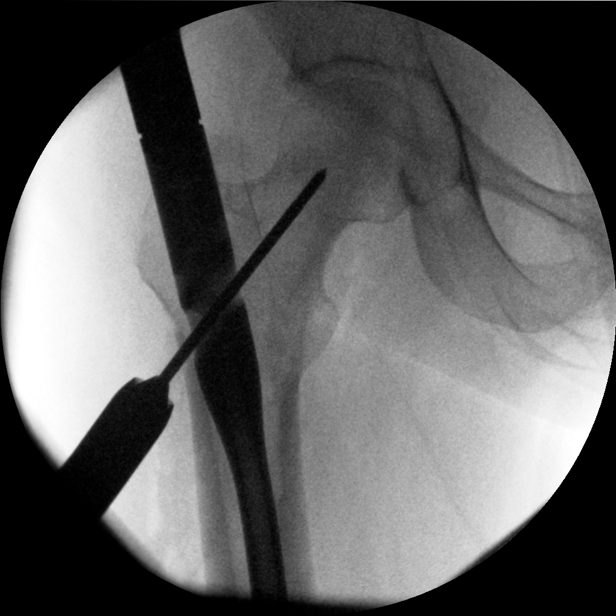

[Series 22: cont. · 1 of 1 slices shown (14 of 15)]
[im 1/1]
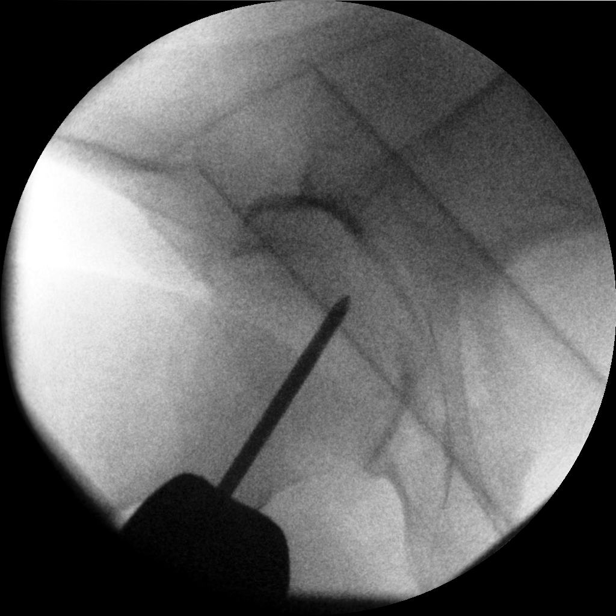

[Series 24: cont. · 1 of 1 slices shown (15 of 15)]
[im 1/1]
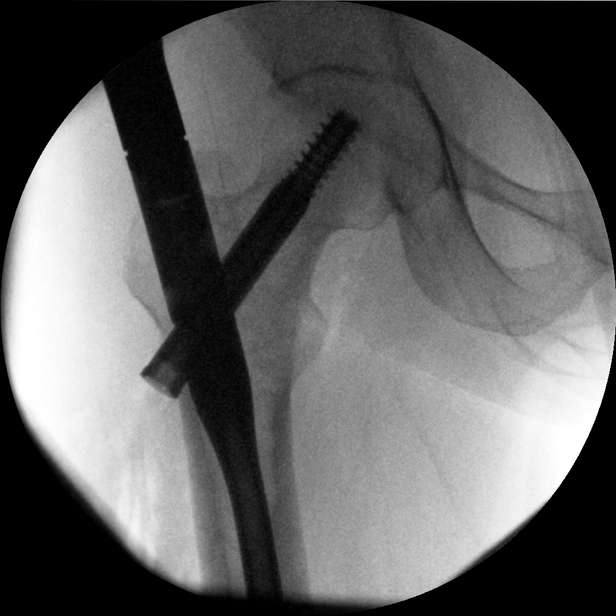

[15 of 24 positions shown; findings below may reference images not displayed]

FINDINGS: C-arm images demonstrate insertion of intramedullary nail and
proximal and distal screws in the right femur. The hardware appears
in good position. Fracture.
IMPRESSION: Satisfactory appearance of the right femur after intramedullary nail
insertion.

C-arm fluoroscopic images were obtained intraoperatively and
submitted for post operative interpretation.

## 2019-09-01 IMAGING — XA DG C-ARM 1-60 MIN
4 series · 4 of 4 positions shown · non-contrast
Comparison: CT dated [DATE]

CLINICAL DATA: Intramedullary nail insertion right femur secondary
to metastatic disease and pinning fracture.

EXAM:
DG C-ARM 1-60 MIN; RIGHT FEMUR 2 VIEWS
FLUOROSCOPY TIME:  Fluoroscopy Time:  3 minutes 45 seconds

[Series 18: cont. · 1 of 1 slices shown (1 of 4)]
[im 1/1]
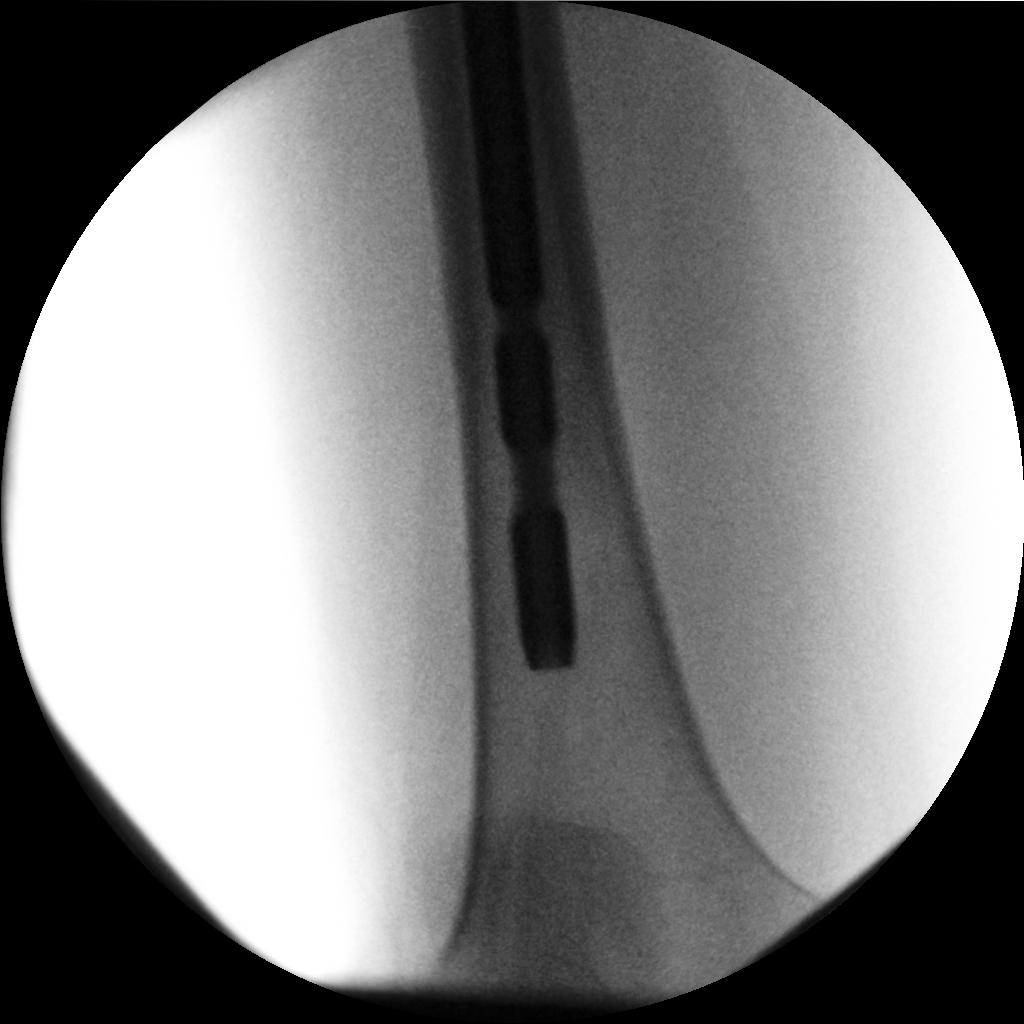

[Series 24: cont. · 1 of 1 slices shown (2 of 4)]
[im 1/1]
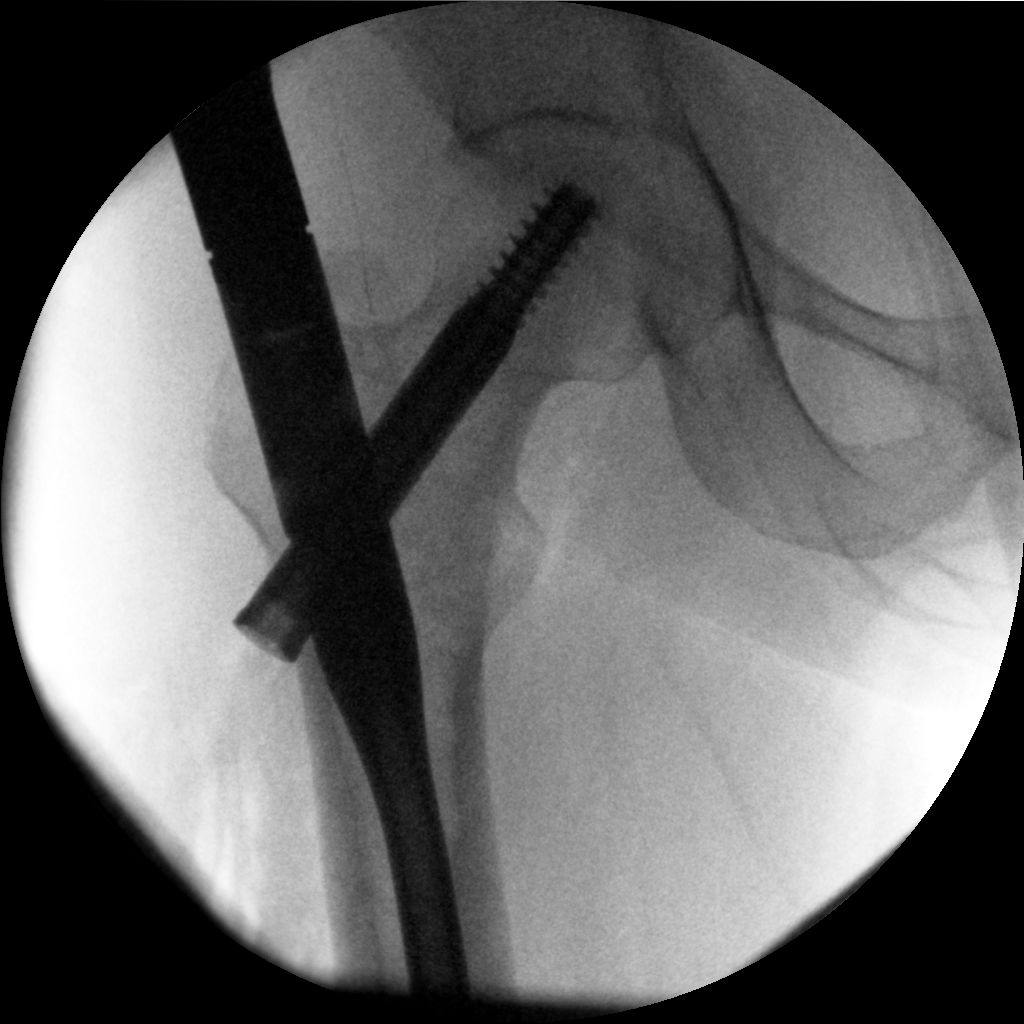

[Series 25: cont. · 1 of 1 slices shown (3 of 4)]
[im 1/1]
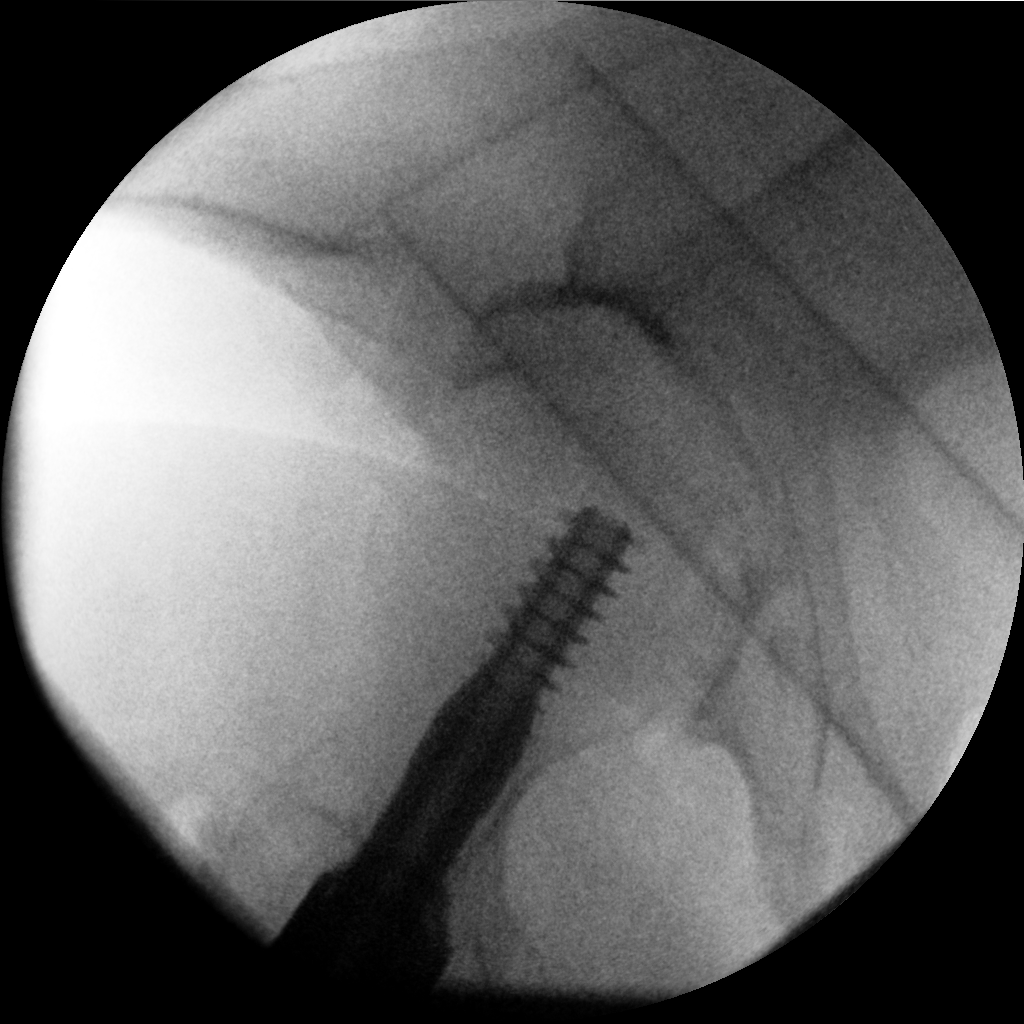

[Series 26: cont. · 1 of 1 slices shown (4 of 4)]
[im 1/1]
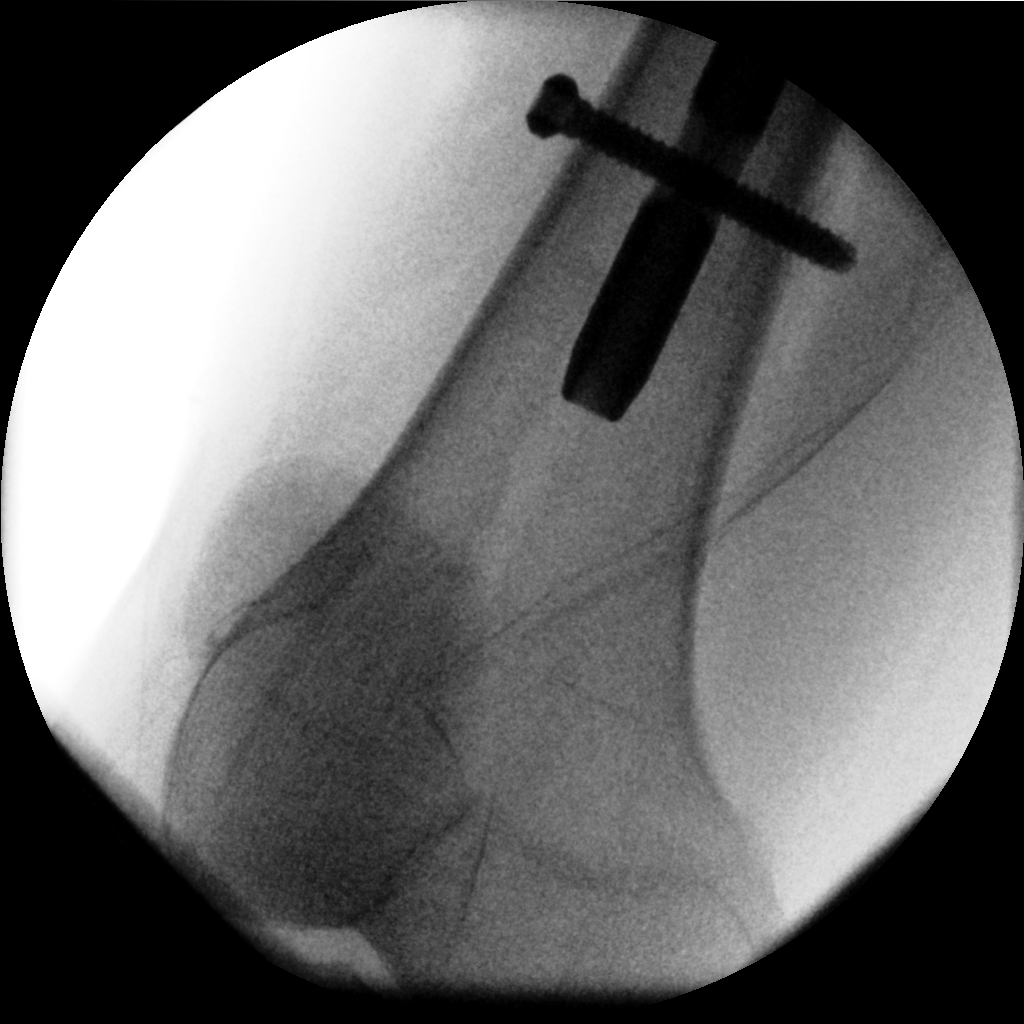

[4 of 4 positions shown; findings below may reference images not displayed]

FINDINGS: C-arm images demonstrate insertion of intramedullary nail and
proximal and distal screws in the right femur. The hardware appears
in good position. Fracture.
IMPRESSION: Satisfactory appearance of the right femur after intramedullary nail
insertion.

C-arm fluoroscopic images were obtained intraoperatively and
submitted for post operative interpretation.

## 2019-09-01 IMAGING — XA DG C-ARM 1-60 MIN
2 series · 2 of 2 positions shown · non-contrast
Comparison: PET-CT dated [DATE]

CLINICAL DATA: Metastatic disease to the sacrum and pelvic bones.

EXAM:
DG C-ARM 1-60 MIN; SACRUM AND COCCYX - 2+ VIEW
FLUOROSCOPY TIME:  Fluoroscopy Time:  3 minutes 45 seconds

[Series 1: cont. · 1 of 1 slices shown]
[im 1/1]
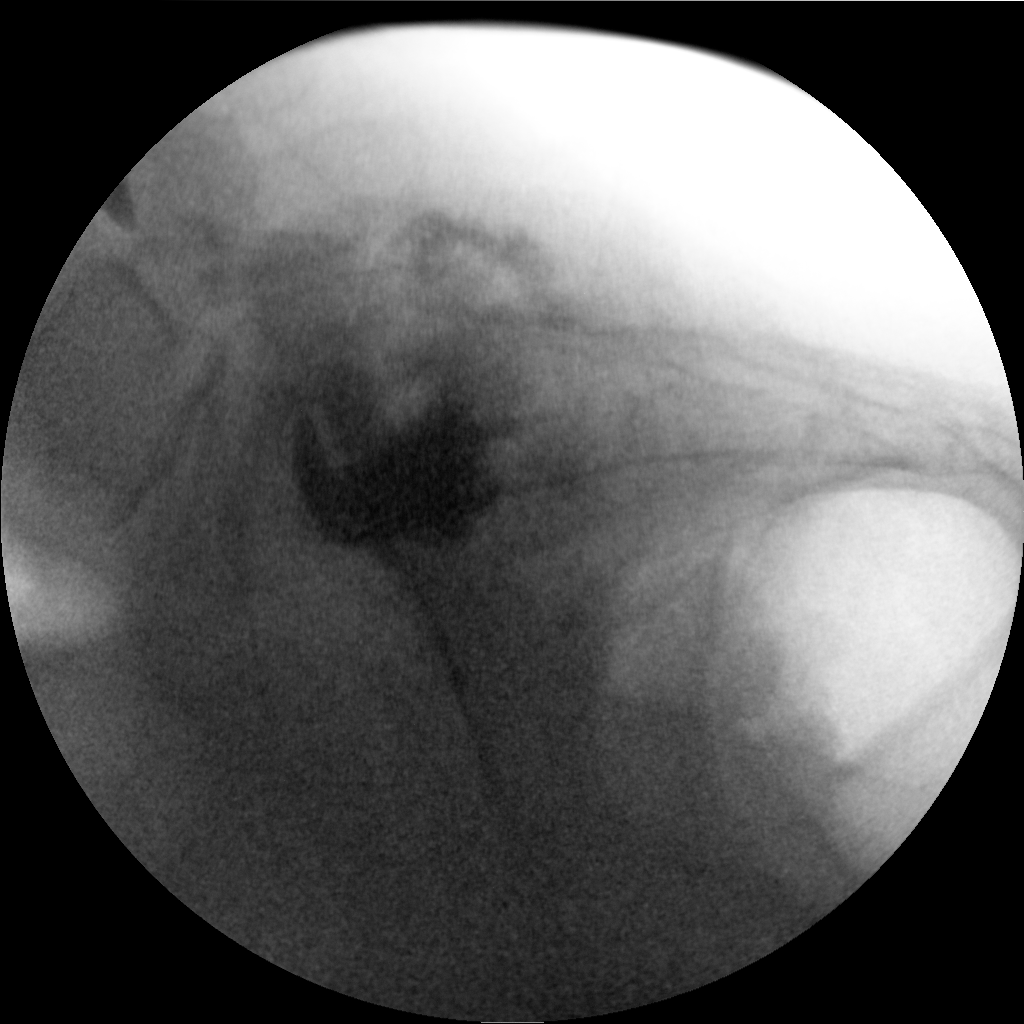

[Series 2: ortho standard · 1 of 1 slices shown]
[im 1/1]
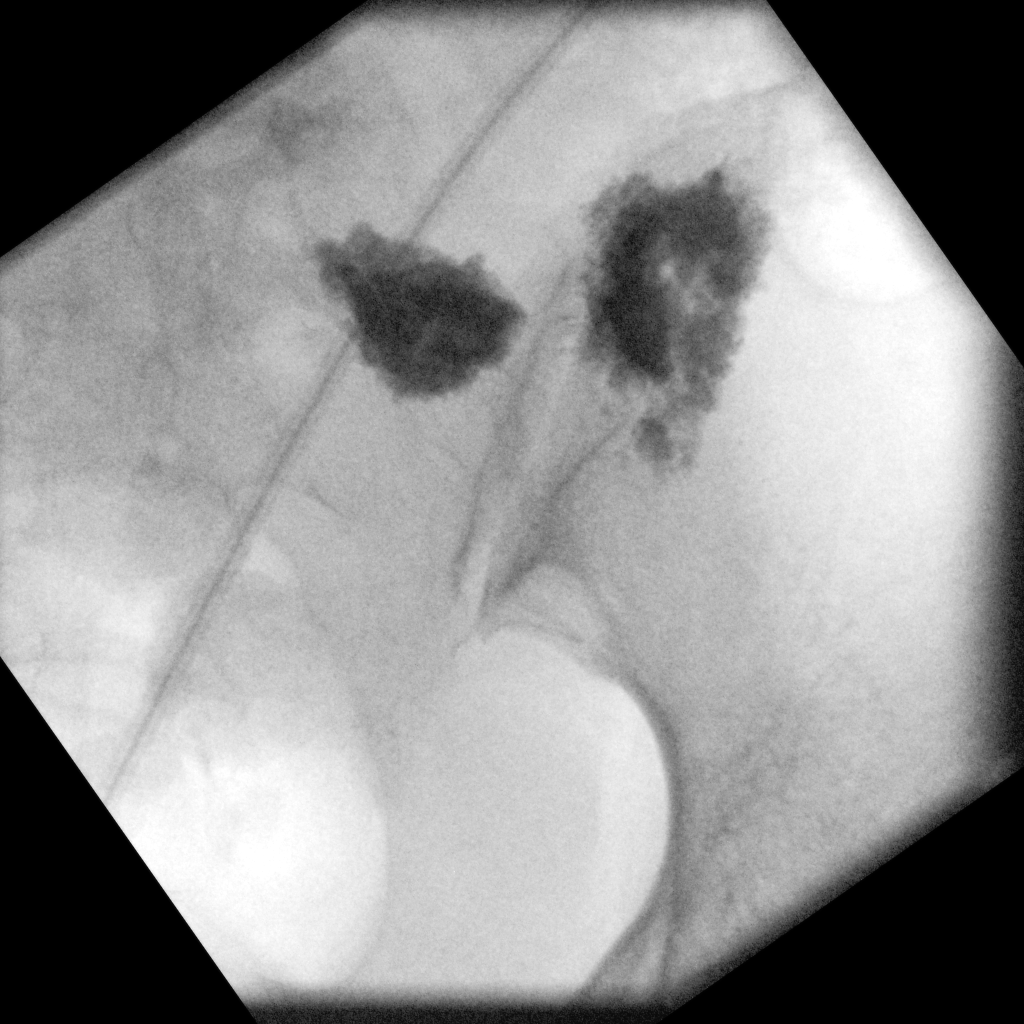

[2 of 2 positions shown; findings below may reference images not displayed]

FINDINGS: AP and lateral C-arm images demonstrate contrast in the right sacral
ala and adjacent right iliac bone which correlates with metastatic
lesions visible on PET-CT.
IMPRESSION: Cement augmentation of the right ilium and right sacral ala after
radiofrequency ablation of metastatic disease.

C-arm fluoroscopic images were obtained intraoperatively and
submitted for post operative interpretation.

## 2019-09-01 IMAGING — XA DG SACRUM/COCCYX 2+V
2 series · 2 of 2 positions shown · non-contrast
Comparison: PET-CT dated [DATE]

CLINICAL DATA: Metastatic disease to the sacrum and pelvic bones.

EXAM:
DG C-ARM 1-60 MIN; SACRUM AND COCCYX - 2+ VIEW
FLUOROSCOPY TIME:  Fluoroscopy Time:  3 minutes 45 seconds

[Series 1: cont. · 1 of 1 slices shown]
[im 1/1]
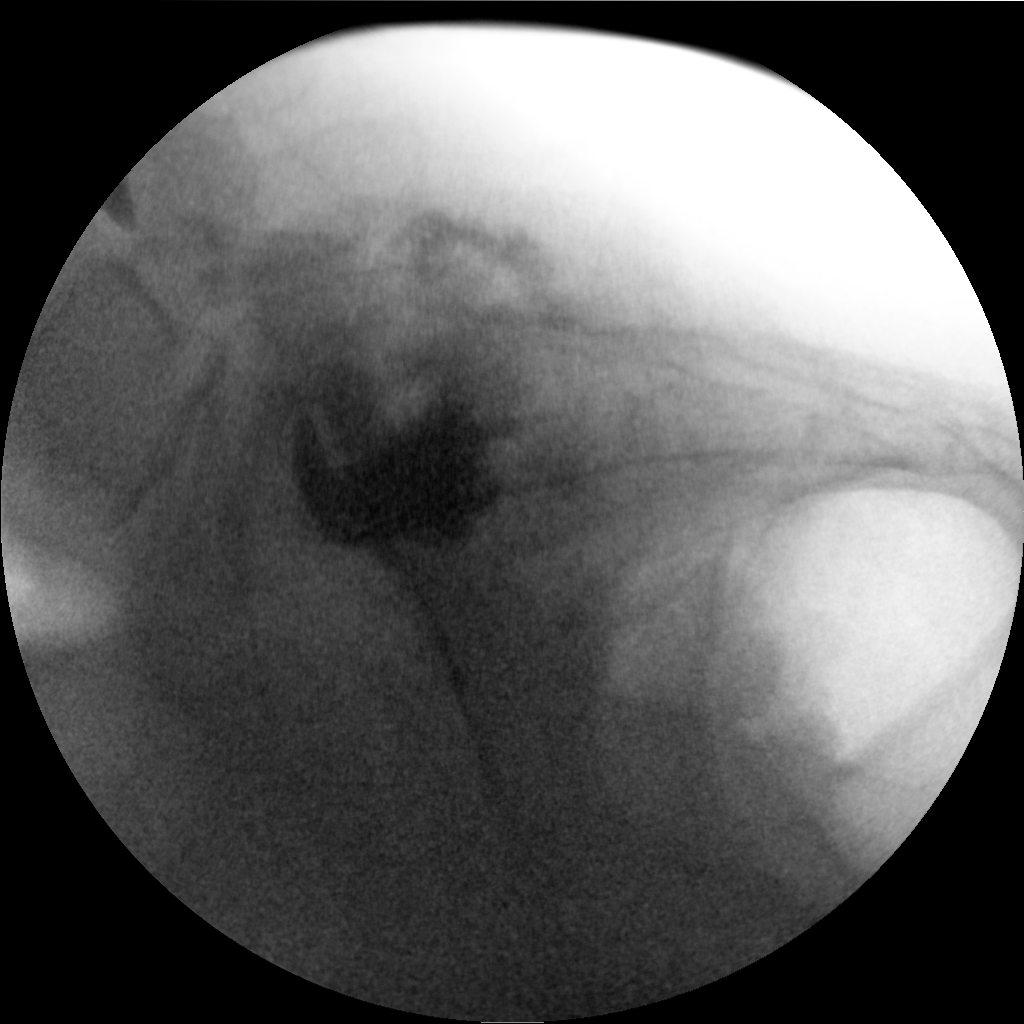

[Series 2: ortho standard · 1 of 1 slices shown]
[im 1/1]
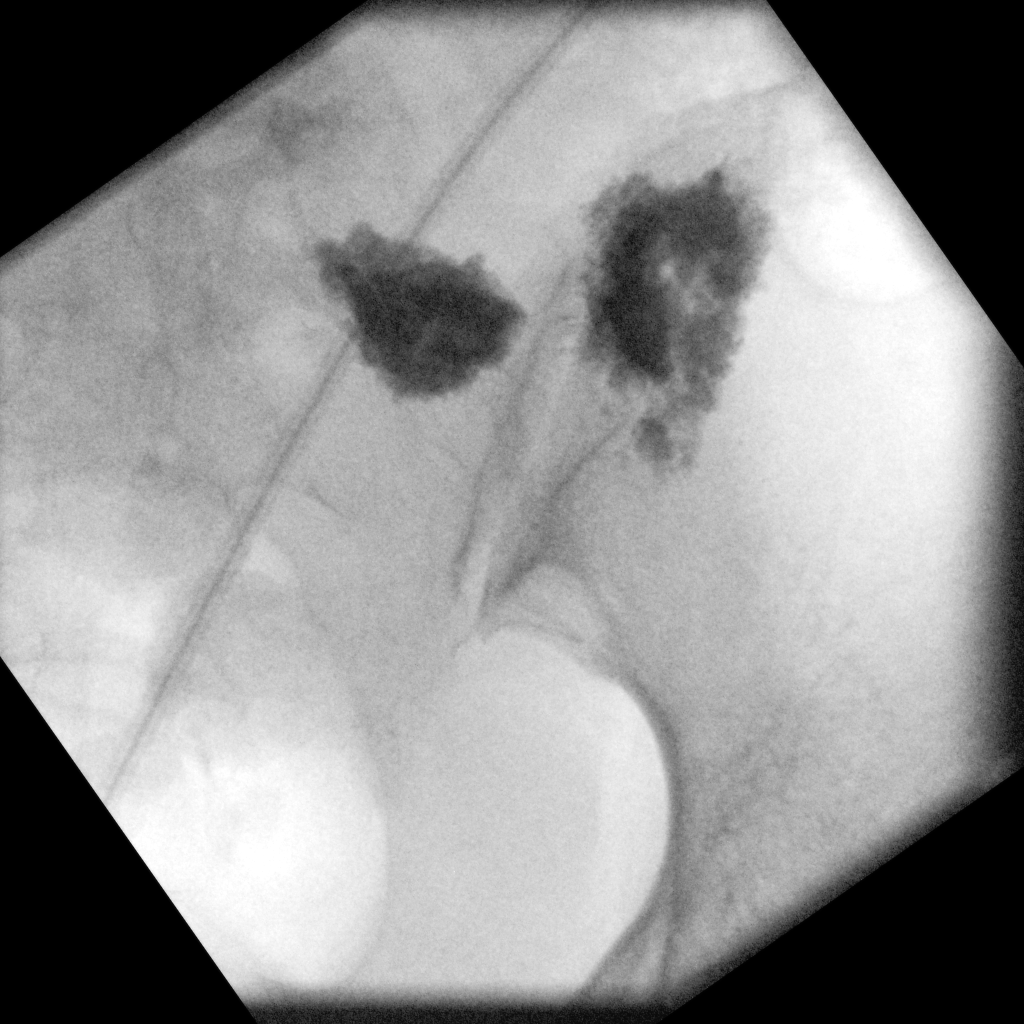

[2 of 2 positions shown; findings below may reference images not displayed]

FINDINGS: AP and lateral C-arm images demonstrate contrast in the right sacral
ala and adjacent right iliac bone which correlates with metastatic
lesions visible on PET-CT.
IMPRESSION: Cement augmentation of the right ilium and right sacral ala after
radiofrequency ablation of metastatic disease.

C-arm fluoroscopic images were obtained intraoperatively and
submitted for post operative interpretation.

## 2019-09-01 SURGERY — KYPHOPLASTY
Anesthesia: General | Site: Leg Upper | Laterality: Right

## 2019-09-01 MED ORDER — ONDANSETRON HCL 4 MG PO TABS: 4.0000 mg | ORAL_TABLET | Freq: Four times a day (QID) | ORAL | Status: DC | PRN

## 2019-09-01 MED ORDER — OXYCODONE HCL 5 MG/5ML PO SOLN: 5.0000 mg | Freq: Once | ORAL | Status: AC | PRN

## 2019-09-01 MED ORDER — PHENOL 1.4 % MT LIQD
1.0000 | OROMUCOSAL | Status: DC | PRN
  Filled 2019-09-01 (×2): qty 177

## 2019-09-01 MED ORDER — OXYCODONE HCL 5 MG PO TABS: 5.0000 mg | ORAL_TABLET | Freq: Once | ORAL | Status: AC | PRN

## 2019-09-01 MED ORDER — FAMOTIDINE 20 MG PO TABS: 20.0000 mg | ORAL_TABLET | Freq: Once | ORAL | Status: AC

## 2019-09-01 MED ORDER — FAMOTIDINE 20 MG PO TABS
ORAL_TABLET | ORAL | Status: AC
  Administered 2019-09-01: 20 mg via ORAL
  Filled 2019-09-01: qty 1

## 2019-09-01 MED ORDER — LIDOCAINE HCL (PF) 1 % IJ SOLN
INTRAMUSCULAR | Status: AC
  Filled 2019-09-01: qty 60

## 2019-09-01 MED ORDER — MAGNESIUM CITRATE PO SOLN
1.0000 | Freq: Once | ORAL | Status: DC | PRN
  Filled 2019-09-01: qty 296

## 2019-09-01 MED ORDER — ONDANSETRON HCL 4 MG/2ML IJ SOLN: 4.0000 mg | Freq: Four times a day (QID) | INTRAMUSCULAR | Status: DC | PRN

## 2019-09-01 MED ORDER — BUPIVACAINE-EPINEPHRINE (PF) 0.5% -1:200000 IJ SOLN
INTRAMUSCULAR | Status: DC | PRN
  Administered 2019-09-01: 10 mL
  Administered 2019-09-01: 20 mL

## 2019-09-01 MED ORDER — MIDAZOLAM HCL 2 MG/2ML IJ SOLN
INTRAMUSCULAR | Status: AC
  Filled 2019-09-01: qty 2

## 2019-09-01 MED ORDER — FENTANYL CITRATE (PF) 100 MCG/2ML IJ SOLN
INTRAMUSCULAR | Status: DC | PRN
  Administered 2019-09-01: 100 ug via INTRAVENOUS
  Administered 2019-09-01: 25 ug via INTRAVENOUS
  Administered 2019-09-01 (×2): 50 ug via INTRAVENOUS
  Administered 2019-09-01: 25 ug via INTRAVENOUS

## 2019-09-01 MED ORDER — NEOMYCIN-POLYMYXIN B GU 40-200000 IR SOLN
Status: DC | PRN
  Administered 2019-09-01: 2 mL

## 2019-09-01 MED ORDER — PROPOFOL 10 MG/ML IV BOLUS
INTRAVENOUS | Status: DC | PRN
  Administered 2019-09-01: 120 mg via INTRAVENOUS

## 2019-09-01 MED ORDER — PROPOFOL 10 MG/ML IV BOLUS
INTRAVENOUS | Status: AC
  Filled 2019-09-01: qty 20

## 2019-09-01 MED ORDER — WHITE PETROLATUM EX OINT
TOPICAL_OINTMENT | CUTANEOUS | Status: AC
  Filled 2019-09-01: qty 5

## 2019-09-01 MED ORDER — FENTANYL CITRATE (PF) 100 MCG/2ML IJ SOLN
INTRAMUSCULAR | Status: AC
  Filled 2019-09-01: qty 2

## 2019-09-01 MED ORDER — NEOMYCIN-POLYMYXIN B GU 40-200000 IR SOLN
Status: AC
  Filled 2019-09-01: qty 2

## 2019-09-01 MED ORDER — ACETAMINOPHEN 10 MG/ML IV SOLN
INTRAVENOUS | Status: AC
  Filled 2019-09-01: qty 100

## 2019-09-01 MED ORDER — ACETAMINOPHEN 10 MG/ML IV SOLN
INTRAVENOUS | Status: DC | PRN
  Administered 2019-09-01: 1000 mg via INTRAVENOUS

## 2019-09-01 MED ORDER — ONDANSETRON HCL 4 MG/2ML IJ SOLN
INTRAMUSCULAR | Status: DC | PRN
  Administered 2019-09-01: 4 mg via INTRAVENOUS

## 2019-09-01 MED ORDER — ENOXAPARIN SODIUM 40 MG/0.4ML ~~LOC~~ SOLN
40.0000 mg | SUBCUTANEOUS | Status: DC
  Administered 2019-09-02 – 2019-09-04 (×3): 40 mg via SUBCUTANEOUS
  Filled 2019-09-01 (×3): qty 0.4

## 2019-09-01 MED ORDER — BUPIVACAINE HCL (PF) 0.5 % IJ SOLN
INTRAMUSCULAR | Status: AC
  Filled 2019-09-01: qty 30

## 2019-09-01 MED ORDER — OXYCODONE HCL 5 MG PO TABS
ORAL_TABLET | ORAL | Status: AC
  Administered 2019-09-01: 5 mg via ORAL
  Filled 2019-09-01: qty 1

## 2019-09-01 MED ORDER — LIDOCAINE HCL (PF) 2 % IJ SOLN
INTRAMUSCULAR | Status: AC
  Filled 2019-09-01: qty 5

## 2019-09-01 MED ORDER — DOCUSATE SODIUM 100 MG PO CAPS
100.0000 mg | ORAL_CAPSULE | Freq: Two times a day (BID) | ORAL | Status: DC
  Administered 2019-09-01 – 2019-09-04 (×6): 100 mg via ORAL
  Filled 2019-09-01 (×6): qty 1

## 2019-09-01 MED ORDER — PHENYLEPHRINE HCL (PRESSORS) 10 MG/ML IV SOLN
INTRAVENOUS | Status: AC
  Filled 2019-09-01: qty 1

## 2019-09-01 MED ORDER — MENTHOL 3 MG MT LOZG
1.0000 | LOZENGE | OROMUCOSAL | Status: DC | PRN
  Filled 2019-09-01: qty 9

## 2019-09-01 MED ORDER — CHLORHEXIDINE GLUCONATE 0.12 % MT SOLN: 15.0000 mL | Freq: Once | OROMUCOSAL | Status: AC

## 2019-09-01 MED ORDER — ROCURONIUM BROMIDE 100 MG/10ML IV SOLN
INTRAVENOUS | Status: DC | PRN
  Administered 2019-09-01: 10 mg via INTRAVENOUS
  Administered 2019-09-01: 40 mg via INTRAVENOUS
  Administered 2019-09-01 (×2): 10 mg via INTRAVENOUS

## 2019-09-01 MED ORDER — DEXAMETHASONE SODIUM PHOSPHATE 10 MG/ML IJ SOLN
INTRAMUSCULAR | Status: AC
  Filled 2019-09-01: qty 1

## 2019-09-01 MED ORDER — FENTANYL CITRATE (PF) 100 MCG/2ML IJ SOLN
INTRAMUSCULAR | Status: AC
  Administered 2019-09-01: 50 ug via INTRAVENOUS
  Filled 2019-09-01: qty 2

## 2019-09-01 MED ORDER — METOCLOPRAMIDE HCL 5 MG PO TABS: 5.0000 mg | ORAL_TABLET | Freq: Three times a day (TID) | ORAL | Status: DC | PRN

## 2019-09-01 MED ORDER — METOCLOPRAMIDE HCL 5 MG/ML IJ SOLN: 5.0000 mg | Freq: Three times a day (TID) | INTRAMUSCULAR | Status: DC | PRN

## 2019-09-01 MED ORDER — LIDOCAINE HCL (CARDIAC) PF 100 MG/5ML IV SOSY
PREFILLED_SYRINGE | INTRAVENOUS | Status: DC | PRN
  Administered 2019-09-01: 40 mg via INTRAVENOUS

## 2019-09-01 MED ORDER — SODIUM CHLORIDE 0.9 % IV SOLN: INTRAVENOUS | Status: DC

## 2019-09-01 MED ORDER — BISACODYL 10 MG RE SUPP: 10.0000 mg | Freq: Every day | RECTAL | Status: DC | PRN

## 2019-09-01 MED ORDER — DEXAMETHASONE SODIUM PHOSPHATE 10 MG/ML IJ SOLN
INTRAMUSCULAR | Status: DC | PRN
  Administered 2019-09-01: 5 mg via INTRAVENOUS

## 2019-09-01 MED ORDER — CHLORHEXIDINE GLUCONATE 0.12 % MT SOLN
OROMUCOSAL | Status: AC
  Administered 2019-09-01: 15 mL via OROMUCOSAL
  Filled 2019-09-01: qty 15

## 2019-09-01 MED ORDER — LIDOCAINE HCL 1 % IJ SOLN
INTRAMUSCULAR | Status: DC | PRN
  Administered 2019-09-01: 10 mL
  Administered 2019-09-01: 20 mL

## 2019-09-01 MED ORDER — BUPIVACAINE-EPINEPHRINE (PF) 0.5% -1:200000 IJ SOLN
INTRAMUSCULAR | Status: AC
  Filled 2019-09-01: qty 30

## 2019-09-01 MED ORDER — EPINEPHRINE PF 1 MG/ML IJ SOLN
INTRAMUSCULAR | Status: AC
  Filled 2019-09-01: qty 1

## 2019-09-01 MED ORDER — SUGAMMADEX SODIUM 200 MG/2ML IV SOLN
INTRAVENOUS | Status: DC | PRN
  Administered 2019-09-01: 120 mg via INTRAVENOUS

## 2019-09-01 MED ORDER — MAGNESIUM HYDROXIDE 400 MG/5ML PO SUSP
30.0000 mL | Freq: Every day | ORAL | Status: DC | PRN
  Filled 2019-09-01: qty 30

## 2019-09-01 MED ORDER — ROCURONIUM BROMIDE 10 MG/ML (PF) SYRINGE
PREFILLED_SYRINGE | INTRAVENOUS | Status: AC
  Filled 2019-09-01: qty 10

## 2019-09-01 MED ORDER — ALUM & MAG HYDROXIDE-SIMETH 200-200-20 MG/5ML PO SUSP: 30.0000 mL | ORAL | Status: DC | PRN

## 2019-09-01 MED ORDER — ONDANSETRON HCL 4 MG/2ML IJ SOLN: 4.0000 mg | Freq: Once | INTRAMUSCULAR | Status: DC | PRN

## 2019-09-01 MED ORDER — LACTATED RINGERS IV SOLN: INTRAVENOUS | Status: DC

## 2019-09-01 MED ORDER — FENTANYL CITRATE (PF) 100 MCG/2ML IJ SOLN
25.0000 ug | INTRAMUSCULAR | Status: DC | PRN
  Administered 2019-09-01: 50 ug via INTRAVENOUS

## 2019-09-01 MED ORDER — CEFAZOLIN SODIUM-DEXTROSE 2-4 GM/100ML-% IV SOLN
2.0000 g | Freq: Four times a day (QID) | INTRAVENOUS | Status: AC
  Administered 2019-09-01 – 2019-09-02 (×3): 2 g via INTRAVENOUS
  Filled 2019-09-01 (×3): qty 100

## 2019-09-01 MED ORDER — PHENYLEPHRINE HCL (PRESSORS) 10 MG/ML IV SOLN
INTRAVENOUS | Status: DC | PRN
  Administered 2019-09-01: 100 ug via INTRAVENOUS

## 2019-09-01 MED ORDER — MIDAZOLAM HCL 2 MG/2ML IJ SOLN
INTRAMUSCULAR | Status: DC | PRN
  Administered 2019-09-01: 2 mg via INTRAVENOUS

## 2019-09-01 MED ORDER — CEFAZOLIN SODIUM 1 G IJ SOLR
INTRAMUSCULAR | Status: AC
  Filled 2019-09-01: qty 20

## 2019-09-01 MED ORDER — ONDANSETRON HCL 4 MG/2ML IJ SOLN
INTRAMUSCULAR | Status: AC
  Filled 2019-09-01: qty 2

## 2019-09-01 SURGICAL SUPPLY — 53 items
BIT DRILL 4.3MMS DISTAL GRDTED (BIT) ×2 IMPLANT
CANISTER SUCT 1200ML W/VALVE (MISCELLANEOUS) ×3 IMPLANT
CEMENT KYPHON CX01A KIT/MIXER (Cement) ×3 IMPLANT
CHLORAPREP W/TINT 26 (MISCELLANEOUS) ×3 IMPLANT
COVER WAND RF STERILE (DRAPES) ×6 IMPLANT
DERMABOND ADVANCED (GAUZE/BANDAGES/DRESSINGS) ×1
DERMABOND ADVANCED .7 DNX12 (GAUZE/BANDAGES/DRESSINGS) ×2 IMPLANT
DEVICE BIOPSY BONE KYPHX (INSTRUMENTS) ×3 IMPLANT
DRAPE 3/4 80X56 (DRAPES) ×3 IMPLANT
DRAPE C-ARM XRAY 36X54 (DRAPES) ×3 IMPLANT
DRAPE U-SHAPE 47X51 STRL (DRAPES) ×3 IMPLANT
DRILL 4.3MMS DISTAL GRADUATED (BIT) ×3
DRSG OPSITE POSTOP 3X4 (GAUZE/BANDAGES/DRESSINGS) ×12 IMPLANT
DURAPREP 26ML APPLICATOR (WOUND CARE) ×3 IMPLANT
GAUZE SPONGE 4X4 12PLY STRL (GAUZE/BANDAGES/DRESSINGS) ×3 IMPLANT
GLOVE BIOGEL PI IND STRL 9 (GLOVE) ×2 IMPLANT
GLOVE BIOGEL PI INDICATOR 9 (GLOVE) ×1
GLOVE SURG SYN 9.0  PF PI (GLOVE) ×2
GLOVE SURG SYN 9.0 PF PI (GLOVE) ×4 IMPLANT
GOWN SRG 2XL LVL 4 RGLN SLV (GOWNS) ×4 IMPLANT
GOWN STRL NON-REIN 2XL LVL4 (GOWNS) ×2
GOWN STRL REUS W/ TWL LRG LVL3 (GOWN DISPOSABLE) ×4 IMPLANT
GOWN STRL REUS W/TWL LRG LVL3 (GOWN DISPOSABLE) ×2
GUIDEPIN VERSANAIL DSP 3.2X444 (ORTHOPEDIC DISPOSABLE SUPPLIES) ×3 IMPLANT
GUIDEWIRE BALL NOSE 80CM (WIRE) ×3 IMPLANT
HFN LAG SCREW 10.5MM X 85MM (Screw) ×3 IMPLANT
HFN RH 130 DEG 9MM X 320MM (Nail) ×3 IMPLANT
KIT OSTEOCOOL BONE ACCESS 10G (MISCELLANEOUS) ×6 IMPLANT
KIT OSTEOCOOL DUAL PROBE 20MM (MISCELLANEOUS) ×3 IMPLANT
KIT TURNOVER KIT A (KITS) ×3 IMPLANT
MAT ABSORB  FLUID 56X50 GRAY (MISCELLANEOUS) ×1
MAT ABSORB FLUID 56X50 GRAY (MISCELLANEOUS) ×2 IMPLANT
NEEDLE FILTER BLUNT 18X 1/2SAF (NEEDLE) ×1
NEEDLE FILTER BLUNT 18X1 1/2 (NEEDLE) ×2 IMPLANT
NEEDLE SPNL 18GX3.5 QUINCKE PK (NEEDLE) ×3 IMPLANT
NS IRRIG 500ML POUR BTL (IV SOLUTION) ×3 IMPLANT
PACK HIP COMPR (MISCELLANEOUS) ×3 IMPLANT
PACK KYPHOPLASTY (MISCELLANEOUS) ×3 IMPLANT
PAD ABD DERMACEA PRESS 5X9 (GAUZE/BANDAGES/DRESSINGS) ×6 IMPLANT
RENTAL RFA GENERATOR (MISCELLANEOUS) ×3 IMPLANT
SCALPEL PROTECTED #15 DISP (BLADE) ×6 IMPLANT
SCREW BONE CORTICAL 5.0X40 (Screw) ×3 IMPLANT
STAPLER SKIN PROX 35W (STAPLE) ×3 IMPLANT
STRAP SAFETY 5IN WIDE (MISCELLANEOUS) ×3 IMPLANT
SUT VIC AB 1 CT1 36 (SUTURE) ×3 IMPLANT
SUT VIC AB 2-0 CT1 (SUTURE) ×3 IMPLANT
SWABSTK COMLB BENZOIN TINCTURE (MISCELLANEOUS) ×3 IMPLANT
SYR 10ML LL (SYRINGE) ×3 IMPLANT
SYS CARTRIDGE BONE CEMENT 8ML (SYSTAGENIX WOUND MANAGEMENT) ×3
SYSTEM CARTRIDG BONE CEMNT 8ML (SYSTAGENIX WOUND MANAGEMENT) ×2 IMPLANT
SYSTEM GUN BONE FILLER SZ2 (MISCELLANEOUS) ×3 IMPLANT
TRAY KYPHOPAK 15/3 EXPRESS 1ST (MISCELLANEOUS) IMPLANT
TRAY KYPHOPAK 20/3 EXPRESS 1ST (MISCELLANEOUS) IMPLANT

## 2019-09-01 NOTE — Op Note (Signed)
08/28/2019 - 09/01/2019  3:47 PM  PATIENT:  Sophia [name]  39 y.o. female  PRE-OPERATIVE DIAGNOSIS:  Right Sacral metastatic disease, iliac tumor and impending proximal femur fracture secondary to metastatic cancer  POST-OPERATIVE DIAGNOSIS: Same  PROCEDURE:  Procedure(s): Right Sacral Radiofrequency Ablation (Right) INTRAMEDULLARY (IM) NAIL INTERTROCHANTRIC (Right) right ilium radiofrequency ablation with cement augmentation ilium and sacrum on the right  SURGEON: Laurene Footman, MD  ASSISTANTS: None  ANESTHESIA:   general  EBL:  No intake/output data recorded.  BLOOD ADMINISTERED:none  DRAINS: none   LOCAL MEDICATIONS USED:  MARCAINE    and LIDOCAINE   SPECIMEN:  Source of Specimen:  Right ilium and reamings from proximal femur  DISPOSITION OF SPECIMEN:  PATHOLOGY  COUNTS:  YES  TOURNIQUET:  * No tourniquets in log *  IMPLANTS: Right long affixes 9 x 320 with 85 mm leg screw, 40 mm distal interlocking screw, bone cement into the ilium and sacrum  DICTATION: .Dragon Dictation patient was brought to the operating room and after adequate general anesthesia was obtained she was placed prone on a radiolucent table.  C arm was brought in and good visualization of the right SI joint was obtained with tumor visible in the right ilium.  After prepping and draping in the usual sterile fashion and infiltration of 10 cc 1% Xylocaine, additional half percent Sensorcaine with lidocaine was infiltrated down to the bone both the insertion plan insertion sites.  Trochars were advanced for radiofrequency ablation into the S1 on the right and proximal ileum AP and lateral imaging showing good position of the trochars then drilling biopsy obtained on the right ilium and then placement of the radiofrequency ablation for approximately 15 minutes after this was complete cement augmentation to both sites was performed with good fill around S1 and in the ileum of the area of the tumor based on  PET scan.  When the cemented set trochars removed and Dermabond applied.  When this was set a Band-Aid was applied and the patient was transferred to the fracture table.  C arm was brought in and good visualization of AP and lateral of the right proximal femur with lesion in the femoral neck.  After prepping and draping using a barrier drape method small incision was made laterally and drilling was made with 2 holes made with trocar was placed into the lesion with radiofrequency ablation between the 2 probes to get to [facility] in the femoral neck.  Following this a guidewire was inserted down the canal after proximal reaming subsequent additionally was required to get the rod down the long guidewire was placed measurement made off of this and reaming to 12 mm was required to get the rod down as well as proximal reamed to 16 mm.  Once the rod was placed in the appropriate position a guidewire was inset inserted up into the center of the head drilling carried out and a 5 mm leg screw placed with a proximal set screw tightened with a quarter turn loosening for compression if needed if pathologic fracture occurs.  Distal interlocking screw was then placed using standard technique drilling measuring and placing the 5.0 screw through the slotted hole.  This point the insertion handle been removed and permanent AP and lateral images obtained.  The wounds were irrigated and closed with #1 Vicryl deep 2-0 Vicryl subcutaneously and skin staples followed by Xeroform 4 x 4's ABD and tape at the proximal 2 incisions and honeycomb dressing distally.  PLAN OF CARE: Continue  as inpatient  PATIENT DISPOSITION:  PACU - hemodynamically stable.

## 2019-09-01 NOTE — Anesthesia Preprocedure Evaluation (Signed)
Anesthesia Evaluation  Patient identified by MRN, date of birth, ID band Patient awake    Reviewed: Allergy & Precautions, H&P , NPO status , Patient's Chart, lab work & pertinent test results  History of Anesthesia Complications Negative for: history of anesthetic complications  Airway Mallampati: II  TM Distance: >3 FB Neck ROM: full    Dental no notable dental hx. (+) Teeth Intact   Pulmonary neg pulmonary ROS, neg shortness of breath, neg sleep apnea, neg COPD, Patient abstained from smoking.Not current smoker,    Pulmonary exam normal breath sounds clear to auscultation       Cardiovascular Exercise Tolerance: Good METS(-) hypertension(-) CAD and (-) Past MI negative cardio ROS Normal cardiovascular exam(-) dysrhythmias  Rhythm:Regular Rate:Normal - Systolic murmurs    Neuro/Psych PSYCHIATRIC DISORDERS Anxiety negative neurological ROS     GI/Hepatic negative GI ROS, Neg liver ROS, neg GERD  ,  Endo/Other  negative endocrine ROSneg diabetes  Renal/GU negative Renal ROS  negative genitourinary   Musculoskeletal Pathologic spine fractures, metastatic breast cancer to  bone   Abdominal   Peds  Hematology negative hematology ROS (+)   Anesthesia Other Findings Past Medical History: No date: Anxiety No date: Cancer [facility]) No date: Vertigo  Past Surgical History: 08/14/2019: BREAST BIOPSY; Left     Comment:  [place] bx of mass, coil marker, path pending 08/14/2019: BREAST BIOPSY; Left     Comment:  [place] bx of LN, hydromarker, path pending 08/14/2019: BREAST BIOPSY; Left     Comment:  affirm bx of calcs, x marker, path pending 08/28/2019: PORTA CATH INSERTION; N/A     Comment:  Procedure: PORTA CATH INSERTION;  Surgeon: Algernon Huxley,              MD;  Location: [facility];  Service:               Cardiovascular;  Laterality: N/A;  BMI    Body Mass Index: 25.32 kg/m       Reproductive/Obstetrics negative OB ROS                             Anesthesia Physical  Anesthesia Plan  ASA: III  Anesthesia Plan: General   Post-op Pain Management:    Induction: Intravenous  PONV Risk Score and Plan: 4 or greater and Midazolam, Ondansetron and Dexamethasone  Airway Management Planned: Oral ETT  Additional Equipment: None  Intra-op Plan:   Post-operative Plan: Extubation in OR  Informed Consent: I have reviewed the patients History and Physical, chart, labs and discussed the procedure including the risks, benefits and alternatives for the proposed anesthesia with the patient or authorized representative who has indicated his/her understanding and acceptance.     Dental Advisory Given  Plan Discussed with: CRNA and Surgeon  Anesthesia Plan Comments: (Discussed risks of anesthesia with patient, including PONV, sore throat, lip/dental damage. Rare risks discussed as well, such as cardiorespiratory and neurological sequelae. Patient understands.)        Anesthesia Quick Evaluation

## 2019-09-01 NOTE — Anesthesia Procedure Notes (Signed)
Procedure Name: Intubation Date/Time: 09/01/2019 1:00 PM Performed by: Arita Miss, MD Pre-anesthesia Checklist: Patient identified, Patient being monitored, Timeout performed, Emergency Drugs available and Suction available Patient Re-evaluated:Patient Re-evaluated prior to induction Oxygen Delivery Method: Circle system utilized Preoxygenation: Pre-oxygenation with 100% oxygen Induction Type: IV induction Ventilation: Mask ventilation without difficulty Laryngoscope Size: 3 and McGraph Grade View: Grade I Tube type: Oral Tube size: 7.0 mm Number of attempts: 1 Airway Equipment and Method: Stylet and LTA kit utilized Placement Confirmation: ETT inserted through vocal cords under direct vision,  positive ETCO2 and breath sounds checked- equal and bilateral Secured at: 20 cm Tube secured with: Tape Dental Injury: Teeth and Oropharynx as per pre-operative assessment

## 2019-09-01 NOTE — Progress Notes (Addendum)
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  Patient also had port placement by Dr.Dew earlier that day.  ED Course: Afebrile, WBC 16K Hospital course: Patient admitted to [facility] for further evaluation and management. Kyphoplasty with ablation scheduled after EDP discussed with oncologist, Dr. Janese Banks, neurosurgeon Dr. Cari Caraway and orthopedist Dr. Rudene Christians as patient not felt to be a good candidate for instrumented fusion.   Patient also planned for prophylactic fixation of the right hip as well as radiofrequency ablation of the sacral metastatic region as inpatient.   Subjective:  Patient was in the OR most of the day.  Seen in the room this evening.  She is reporting pain along the right hip at 8/10.  Requesting eyedrops for dry eyes.  Friend at bedside.  Objective: Vitals:   09/01/19 1610 09/01/19 1616 09/01/19 1629 09/01/19 1709  BP:  (!) 131/94  (!) 144/90  Pulse: 81 65  65  Resp: '17 10  16  '$ Temp:   97.7 F (36.5 C) 97.9 F (36.6 C)  TempSrc:    Oral  SpO2: 97% 92%  94%  Weight:      Height:       No intake or output data in the 24 hours ending 09/01/19 1813 Filed Weights   08/28/19 1938  Weight: 60.8 kg    Physical Examination:  General exam: Appears calm and no acute distress.   Respiratory system: Clear to auscultation. Respiratory effort normal. Cardiovascular system: S1 & S2 heard, RRR. No JVD, murmurs, rubs, gallops or clicks. No pedal edema. Gastrointestinal system: Abdomen  is nondistended, soft and nontender. Normal bowel sounds heard. Central nervous system: Alert and oriented. No new focal neurological deficits. Extremities: Postop dressing along right hip/thigh.  Sutures along Port-A-Cath entry site/neck Skin: Healing surgical scar from port placement along right subclavicular area. Psychiatry: Judgement and insight appear normal. Mood & affect appropriate.   Data Reviewed: I have personally reviewed following labs and imaging studies  CBC: Recent Labs  Lab 08/28/19 2044 08/29/19 1857 08/31/19 0443 09/01/19 0653  WBC 16.7* 27.6* 10.3 21.3*  NEUTROABS 15.0* 23.6*  --  16.4*  HGB 14.3 14.6 9.2* 13.4  HCT 41.2 42.7 29.5* 39.2  MCV 84.4 88.0 91.6 86.7  PLT 446* 420* 175 956   Basic Metabolic Panel: Recent Labs  Lab 08/28/19 2044 08/30/19 0614 08/31/19 0730 09/01/19 0653  NA 133* 136 136 137  K 3.8 4.0 3.5 3.8  CL 101 102 103 103  CO2 '24 24 25 26  '$ GLUCOSE 130* 123* 92 101*  BUN 21* '12 11 12  '$ CREATININE 0.61 0.53 0.40* 0.38*  CALCIUM 8.8* 8.6* 8.3* 8.6*  MG  --   --  2.0  --    GFR: Estimated Creatinine Clearance: 78.2 mL/min (A) (by C-G formula based on SCr of 0.38 mg/dL (L)). Liver Function Tests: Recent Labs  Lab 08/28/19 2044 08/30/19 0614  AST 46* 21  ALT 68* 39  ALKPHOS 99 100  BILITOT 0.6 0.5  PROT 7.1 6.1*  ALBUMIN 3.4* 3.0*   No results for  input(s): LIPASE, AMYLASE in the last 168 hours. No results for input(s): AMMONIA in the last 168 hours. Coagulation Profile: No results for input(s): INR, PROTIME in the last 168 hours. Cardiac Enzymes: No results for input(s): CKTOTAL, CKMB, CKMBINDEX, TROPONINI in the last 168 hours. BNP (last 3 results) No results for input(s): PROBNP in the last 8760 hours. HbA1C: No results for input(s): HGBA1C in the last 72 hours. CBG: Recent Labs  Lab 08/27/19 1224  GLUCAP 78   Lipid Profile: No results for input(s): CHOL, HDL, LDLCALC, TRIG, CHOLHDL, LDLDIRECT in the last 72  hours. Thyroid Function Tests: Recent Labs    08/31/19 0730  TSH 0.085*   Anemia Panel: No results for input(s): VITAMINB12, FOLATE, FERRITIN, TIBC, IRON, RETICCTPCT in the last 72 hours. Sepsis Labs: No results for input(s): PROCALCITON, LATICACIDVEN in the last 168 hours.  Recent Results (from the past 240 hour(s))  SARS CORONAVIRUS 2 (TAT 6-24 HRS) Nasopharyngeal Nasopharyngeal Swab     Status: None   Collection Time: 08/26/19  1:27 PM   Specimen: Nasopharyngeal Swab  Result Value Ref Range Status   SARS Coronavirus 2 NEGATIVE NEGATIVE Final    Comment: (NOTE) SARS-CoV-2 target nucleic acids are NOT DETECTED. The SARS-CoV-2 RNA is generally detectable in upper and lower respiratory specimens during the acute phase of infection. Negative results do not preclude SARS-CoV-2 infection, do not rule out co-infections with other pathogens, and should not be used as the sole basis for treatment or other patient management decisions. Negative results must be combined with clinical observations, patient history, and epidemiological information. The expected result is Negative. Fact Sheet for Patients: SugarRoll.be Fact Sheet for Healthcare Providers: https://www.woods-mathews.com/ This test is not yet approved or cleared by the [place] FDA and  has been authorized for detection and/or diagnosis of SARS-CoV-2 by FDA under an Emergency Use Authorization (EUA). This EUA will remain  in effect (meaning this test can be used) for the duration of the COVID-19 declaration under Section 56 4(b)(1) of the Act, 21 U.S.C. section 360bbb-3(b)(1), unless the authorization is terminated or revoked sooner. Performed at [facility], [address] [place], [place] [address]   SARS Coronavirus 2 by RT PCR (hospital order, performed in [facility] hospital lab) Nasopharyngeal Nasopharyngeal Swab     Status: None   Collection Time: 08/29/19  4:12  AM   Specimen: Nasopharyngeal Swab  Result Value Ref Range Status   SARS Coronavirus 2 NEGATIVE NEGATIVE Final    Comment: (NOTE) SARS-CoV-2 target nucleic acids are NOT DETECTED.  The SARS-CoV-2 RNA is generally detectable in upper and lower respiratory specimens during the acute phase of infection. The lowest concentration of SARS-CoV-2 viral copies this assay can detect is 250 copies / mL. A negative result does not preclude SARS-CoV-2 infection and should not be used as the sole basis for treatment or other patient management decisions.  A negative result may occur with improper specimen collection / handling, submission of specimen other than nasopharyngeal swab, presence of viral mutation(s) within the areas targeted by this assay, and inadequate number of viral copies (<250 copies / mL). A negative result must be combined with clinical observations, patient history, and epidemiological information.  Fact Sheet for Patients:   StrictlyIdeas.no  Fact Sheet for Healthcare Providers: BankingDealers.co.za  This test is not yet approved or  cleared by the [place] FDA and has been authorized for detection and/or diagnosis of SARS-CoV-2 by FDA under an Emergency Use Authorization (EUA).  This EUA  will remain in effect (meaning this test can be used) for the duration of the COVID-19 declaration under Section 564(b)(1) of the Act, 21 U.S.C. section 360bbb-3(b)(1), unless the authorization is terminated or revoked sooner.  Performed at [facility], [address] [place], [place] [address]       Radiology Studies: DG Sacrum/Coccyx  Result Date: 09/01/2019 CLINICAL DATA:  Metastatic disease to the sacrum and pelvic bones. EXAM: DG C-ARM 1-60 MIN; SACRUM AND COCCYX - 2+ VIEW FLUOROSCOPY TIME:  Fluoroscopy Time:  3 minutes 45 seconds COMPARISON:  PET-CT dated 08/27/2019 FINDINGS: AP and lateral C-arm images demonstrate  contrast in the right sacral ala and adjacent right iliac bone which correlates with metastatic lesions visible on PET-CT. IMPRESSION: Cement augmentation of the right ilium and right sacral ala after radiofrequency ablation of metastatic disease. C-arm fluoroscopic images were obtained intraoperatively and submitted for post operative interpretation. Electronically Signed   By: Francene Boyers M.D.   On: 09/01/2019 16:02   DG C-Arm 1-60 Min  Result Date: 09/01/2019 CLINICAL DATA:  Intramedullary nail insertion right femur secondary to metastatic disease and pinning fracture. EXAM: DG C-ARM 1-60 MIN; RIGHT FEMUR 2 VIEWS FLUOROSCOPY TIME:  Fluoroscopy Time:  3 minutes 45 seconds COMPARISON:  CT dated 08/27/2019 FINDINGS: C-arm images demonstrate insertion of intramedullary nail and proximal and distal screws in the right femur. The hardware appears in good position. Fracture. IMPRESSION: Satisfactory appearance of the right femur after intramedullary nail insertion. C-arm fluoroscopic images were obtained intraoperatively and submitted for post operative interpretation. Electronically Signed   By: Francene Boyers M.D.   On: 09/01/2019 16:05   DG C-Arm 1-60 Min  Result Date: 09/01/2019 CLINICAL DATA:  Metastatic disease to the sacrum and pelvic bones. EXAM: DG C-ARM 1-60 MIN; SACRUM AND COCCYX - 2+ VIEW FLUOROSCOPY TIME:  Fluoroscopy Time:  3 minutes 45 seconds COMPARISON:  PET-CT dated 08/27/2019 FINDINGS: AP and lateral C-arm images demonstrate contrast in the right sacral ala and adjacent right iliac bone which correlates with metastatic lesions visible on PET-CT. IMPRESSION: Cement augmentation of the right ilium and right sacral ala after radiofrequency ablation of metastatic disease. C-arm fluoroscopic images were obtained intraoperatively and submitted for post operative interpretation. Electronically Signed   By: Francene Boyers M.D.   On: 09/01/2019 16:02   DG FEMUR, MIN 2 VIEWS RIGHT  Result Date:  09/01/2019 CLINICAL DATA:  Intramedullary nail insertion right femur secondary to metastatic disease and pinning fracture. EXAM: DG C-ARM 1-60 MIN; RIGHT FEMUR 2 VIEWS FLUOROSCOPY TIME:  Fluoroscopy Time:  3 minutes 45 seconds COMPARISON:  CT dated 08/27/2019 FINDINGS: C-arm images demonstrate insertion of intramedullary nail and proximal and distal screws in the right femur. The hardware appears in good position. Fracture. IMPRESSION: Satisfactory appearance of the right femur after intramedullary nail insertion. C-arm fluoroscopic images were obtained intraoperatively and submitted for post operative interpretation. Electronically Signed   By: Francene Boyers M.D.   On: 09/01/2019 16:05        Scheduled Meds: . Chlorhexidine Gluconate Cloth  6 each Topical Daily  . dexamethasone (DECADRON) injection  8 mg Intravenous Q12H  . docusate sodium  100 mg Oral BID  . [START ON 09/02/2019] enoxaparin (LOVENOX) injection  40 mg Subcutaneous Q24H  . gabapentin  300 mg Oral BID  . polyethylene glycol  17 g Oral Daily  . senna-docusate  1 tablet Oral BID  . white petrolatum       Continuous Infusions: . sodium chloride 75 mL/hr  at 09/01/19 1749  .  ceFAZolin (ANCEF) IV    . lactated ringers 800 mL/hr at 09/01/19 1443     Assessment/Plan:  Back pain/pathological vertebral compression fractures:  Underwent radiofrequency ablation of tumor with kyphoplasty at fractured levels of T6, L1 and L4. Recieveing supportive management with pain meds, decadron (8 mg twice daily for 10 days from 6/4 to 6/14), analgesics (IV morphine/oxycodone) and anxiolytics.  On IV fluids at 75 mL/h.  Right sacrum and ilium tumor:Seen by Orthopedics and went to OR today for radiofrequency ablation and cement augmentation of sacrum/ iliac crest lesions.   Right proximal femur lesion : Underwent surgery on the right hip with prophylactic rodding with radiofrequency ablation to the tumor prior to placement of metal rod and  screws  Metastatic breast cancer:ER/PR negative HER-2 positive.  Appreciate oncology input and follow-up.  May benefit from palliative care consult for symptom management  Left acetabular lesion , does not appear symptomatic  Leukocytosis, steroid-induced  Transaminitis : Unclear etiology. now resolved, hepatitis screen negative. Liver okay on PET scan      DVT prophylaxis: lovenox Code Status: Full code Family / Patient Communication: Patient is a single mother with 3 children no immediate family in the [place], friends updated at bedside with permission on 6/14 Disposition Plan:   Status is: Inpatient  Remains inpatient appropriate because:Ongoing active pain requiring inpatient pain management   Dispo: The patient is from: Home  Anticipated d/c is to: Home vs SNF rehab  Anticipated d/c date is: 2 days  Patient currently is not medically stable to d/c.   LOS: 3 days    Time spent: 25 minutes    Guilford Shi, MD [facility] Pager in [place]  If 7PM-7AM, please contact night-coverage [email or web address] 09/01/2019, 6:13 PM

## 2019-09-01 NOTE — Progress Notes (Signed)
Surgery scheduled for noon today.  CBC pending.  Site marked anterior hip and lower abdomen for sacrum and ilium

## 2019-09-01 NOTE — Progress Notes (Signed)
PT Cancellation Note  Patient Details Name: Sophia [name] MRN: [id number] DOB: January 07, 1981   Cancelled Treatment:    Reason Eval/Treat Not Completed: Other (comment)   Pt awaiting surgery today.  Will hold session and continue as appropriate.     Chesley Noon 09/01/2019, 11:11 AM

## 2019-09-01 NOTE — Anesthesia Postprocedure Evaluation (Signed)
Anesthesia Post Note  Patient: Sophia [name]  Procedure(s) Performed: Right Sacral Radiofrequency Ablation and Cement Augmentation, Right sacrum and iliac crest (Right Back) INTRAMEDULLARY (IM) NAIL INTERTROCHANTRIC AND RADIOFREQUENCY ABLATION (Right Leg Upper)  Patient location during evaluation: PACU Anesthesia Type: General Level of consciousness: awake and alert Pain management: pain level controlled Vital Signs Assessment: post-procedure vital signs reviewed and stable Respiratory status: spontaneous breathing, nonlabored ventilation, respiratory function stable and patient connected to nasal cannula oxygen Cardiovascular status: blood pressure returned to baseline and stable Postop Assessment: no apparent nausea or vomiting Anesthetic complications: no   No complications documented.   Last Vitals:  Vitals:   09/01/19 1616 09/01/19 1629  BP: (!) 131/94   Pulse: 65   Resp: 10   Temp:  36.5 C  SpO2: 92%     Last Pain:  Vitals:   09/01/19 1616  TempSrc:   PainSc: 4                  Arita Miss

## 2019-09-01 NOTE — Transfer of Care (Signed)
Immediate Anesthesia Transfer of Care Note  Patient: Sophia [name]  Procedure(s) Performed: Right Sacral Radiofrequency Ablation (Right Back) INTRAMEDULLARY (IM) NAIL INTERTROCHANTRIC (Right Leg Upper)  Patient Location: PACU  Anesthesia Type:General  Level of Consciousness: sedated  Airway & Oxygen Therapy: Patient connected to face mask oxygen  Post-op Assessment: Post -op Vital signs reviewed and stable  Post vital signs: stable  Last Vitals:  Vitals Value Taken Time  BP 123/89 09/01/19 1543  Temp    Pulse 66 09/01/19 1544  Resp 15 09/01/19 1544  SpO2 100 % 09/01/19 1544  Vitals shown include unvalidated device data.  Last Pain:  Vitals:   09/01/19 1140  TempSrc: Tympanic  PainSc: 3       Patients Stated Pain Goal: 0 (15/94/58 5929)  Complications: No complications documented.

## 2019-09-02 ENCOUNTER — Encounter: Payer: Self-pay | Admitting: Orthopedic Surgery

## 2019-09-02 LAB — BASIC METABOLIC PANEL
Anion gap: 8 (ref 5–15)
BUN: 13 mg/dL (ref 6–20)
CO2: 25 mmol/L (ref 22–32)
Calcium: 8 mg/dL — ABNORMAL LOW (ref 8.9–10.3)
Chloride: 103 mmol/L (ref 98–111)
Creatinine, Ser: 0.47 mg/dL (ref 0.44–1.00)
GFR calc Af Amer: >60 mL/min (ref >60)
GFR calc non Af Amer: >60 mL/min (ref >60)
Glucose, Bld: 141 mg/dL — ABNORMAL HIGH (ref 70–99)
Potassium: 3.9 mmol/L (ref 3.5–5.1)
Sodium: 136 mmol/L (ref 135–145)

## 2019-09-02 LAB — CBC
HCT: 33.4 % — ABNORMAL LOW (ref 36.0–46.0)
Hemoglobin: 11.4 g/dL — ABNORMAL LOW (ref 12.0–15.0)
MCH: 30 pg (ref 26.0–34.0)
MCHC: 34.1 g/dL (ref 30.0–36.0)
MCV: 87.9 fL (ref 80.0–100.0)
Platelets: 290 10*3/uL (ref 150–400)
RBC: 3.8 MIL/uL — ABNORMAL LOW (ref 3.87–5.11)
RDW: 13.5 % (ref 11.5–15.5)
WBC: 23.5 10*3/uL — ABNORMAL HIGH (ref 4.0–10.5)
nRBC: 0 % (ref 0.0–0.2)

## 2019-09-02 MED ORDER — FENTANYL 12 MCG/HR TD PT72
1.0000 | MEDICATED_PATCH | TRANSDERMAL | Status: DC
  Administered 2019-09-02: 1 via TRANSDERMAL
  Filled 2019-09-02: qty 1

## 2019-09-02 MED ORDER — MORPHINE SULFATE (PF) 2 MG/ML IV SOLN
2.0000 mg | INTRAVENOUS | Status: DC | PRN
  Administered 2019-09-02 – 2019-09-03 (×3): 2 mg via INTRAVENOUS
  Filled 2019-09-02 (×3): qty 1

## 2019-09-02 MED ORDER — OXYCODONE HCL 5 MG PO TABS
5.0000 mg | ORAL_TABLET | ORAL | Status: DC | PRN
  Administered 2019-09-02 – 2019-09-04 (×4): 10 mg via ORAL
  Filled 2019-09-02 (×4): qty 2

## 2019-09-02 MED ORDER — LIDOCAINE-PRILOCAINE 2.5-2.5 % EX CREA
TOPICAL_CREAM | Freq: Once | CUTANEOUS | Status: AC
  Filled 2019-09-02: qty 5

## 2019-09-02 NOTE — TOC Initial Note (Addendum)
Transition of Care [facility]) - Initial/Assessment Note    Patient Details  Name: Sophia [name] MRN: [id number] Date of Birth: [birth year]  Transition of Care [facility]) CM/SW Contact:    Elease Hashimoto, LCSW Phone Number: 09/02/2019, 1:27 PM  Clinical Narrative:  Met with pt to discuss discharge needs. She plans to go home with her three son's-15,13 & 41 yo. Friend-muriel is assisting with them while pt is in the hospital and will assist once discharged home. She can not be there 24 hr per day but can assist with appointments and transportation. Pt feels she is doing better each day and moving better now has had surgery on her back. She has a PCP-Charles [employer] and was driving prior to admission. She is aware of her numerous appointments once discharged to follow up with her cancer. She hopes to fight and see what happens, she is still trying to process all that has happened in the last month. Will work on discharge needs and will get her a rw for home since she is using this in her room. Referral made to [employer]. Will await any other needs and provide support while here.               3:11 PM Made referral to [facility] for HHPT and have added SW.     Expected Discharge Plan: [place] Barriers to Discharge: Inadequate or no insurance, Continued Medical Work up   Patient Goals and CMS Choice Patient states their goals for this hospitalization and ongoing recovery are:: I plan to go home I am getting better with my walking now I've had surgery      Expected Discharge Plan and Services Expected Discharge Plan: [place] In-house Referral: Clinical Social Work   Post Acute Care Choice: Home Health, Durable Medical Equipment Living arrangements for the past 2 months: Mobile Home Expected Discharge Date: 09/01/19               DME Arranged: Gilford Rile rolling DME Agency: [employer] Date DME Agency Contacted: 09/02/19 Time DME Agency Contacted:  79 Representative spoke with at DME Agency: Thedore Mins            Prior Living Arrangements/Services Living arrangements for the past 2 months: Mobile Home Lives with:: Minor Children (15,13 & 49 yo son's) Patient language and need for interpreter reviewed:: Yes (spanish for medical information) Do you feel safe going back to the place where you live?: Yes      Need for Family Participation in Patient Care: No (Comment) Care giver support system in place?: No (comment) (friend intermittently)   Criminal Activity/Legal Involvement Pertinent to Current Situation/Hospitalization: No - Comment as needed  Activities of Daily Living Home Assistive Devices/Equipment: None ADL Screening (condition at time of admission) Patient's cognitive ability adequate to safely complete daily activities?: Yes Is the patient deaf or have difficulty hearing?: No Does the patient have difficulty seeing, even when wearing glasses/contacts?: No Does the patient have difficulty concentrating, remembering, or making decisions?: No Patient able to express need for assistance with ADLs?: Yes Does the patient have difficulty dressing or bathing?: No Independently performs ADLs?: Yes (appropriate for developmental age) Does the patient have difficulty walking or climbing stairs?: Yes Weakness of Legs: Right Weakness of Arms/Hands: None  Permission Sought/Granted Permission sought to share information with : Other (comment) (friend) Permission granted to share information with : Yes, Verbal Permission Granted  Share Information with NAME: Izola Price  Permission granted to share info w Relationship: friend     Emotional Assessment Appearance:: Appears stated age Attitude/Demeanor/Rapport: Engaged, Gracious Affect (typically observed): Adaptable, Accepting Orientation: : Oriented to Self, Oriented to Place, Oriented to  Time, Oriented to Situation   Psych Involvement: No (comment)  Admission diagnosis:   Multiple pathological fractures [M84.40XA] Metastatic malignant neoplasm, unspecified site [facility]) [C79.9] Metastatic breast cancer ([place]) [C50.919] Patient Active Problem List   Diagnosis Date Noted  . Goals of care, counseling/discussion   . Neoplasm related pain   . Metastatic breast cancer ([place]) 08/29/2019  . 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 Multiple pathological fractures 08/28/2019  . Anxiety 08/28/2019  . Preoperative clearance 08/28/2019  . Leukocytosis 08/28/2019   PCP:  Center, Rise Patience, NP Pharmacy:   [id number] [place] [address] [address]Phone: [phone]: [phone] [place]Phone: [phone]: [phone]    Social Determinants of Health (SDOH) Interventions    Readmission Risk Interventions No flowsheet data found.

## 2019-09-02 NOTE — Progress Notes (Signed)
VAST consulted to obtain IV access for fluids and meds. Pt has a newly placed port (08/28/19) which has not been accessed. Assessed pt's right chest port site which has healed nicely although surgical glue still on skin. Explained procedure to patient and she was apprehensive. Provided education regarding use of port for fluids & meds, as well as lab draws. Pt verbalized permission to use port instead of multiple sticks for IV and lab draw. Unit RN, Caryl Pina to order and place numbing cream on port area for 1 hour before VAST RN will return to access.

## 2019-09-02 NOTE — Patient Instructions (Signed)
Trastuzumab injection for infusion Qu es este medicamento? El TRASTUZUMAB es un anticuerpo monoclonal. Genene Churn para tratar el cncer de mama y de [place]. Este medicamento puede ser utilizado para otros usos; si tiene alguna pregunta consulte con su proveedor de atencin mdica o con su farmacutico. MARCAS COMUNES: Herceptin, Belenda Cruise, Og[place]ri, Ontruzant, Trazimera Qu le debo informar a mi profesional de la salud antes de tomar este medicamento? Necesitan saber si usted presenta alguno de los siguientes problemas o situaciones: enfermedad cardiaca insuficiencia cardiaca enfermedad pulmonar o respiratoria, como asma una reaccin alrgica o inusual al trastuzumab, al alcohol benclico, a otros medicamentos, alimentos, colorantes o conservantes si est embarazada o buscando quedar embarazada si est amamantando a un beb Cmo debo utilizar este medicamento? Este medicamento se administra mediante infusin por va intravenosa. Lo administra un profesional de la salud calificado en un hospital o en un entorno clnico. Hable con su pediatra para informarse acerca del uso de este medicamento en nios. Este medicamento no est aprobado para uso en nios. Sobredosis: Pngase en contacto inmediatamente con un centro toxicolgico o una sala de urgencia si usted cree que haya tomado demasiado medicamento. ATENCIN: [employer] es solo para usted. No comparta este medicamento con nadie. Qu sucede si me olvido de una dosis? Es importante no olvidar ninguna dosis. Informe a su mdico o a su profesional de la salud si no puede asistir a Photographer. Qu puede interactuar con este medicamento? Este medicamento podra interactuar con los siguientes frmacos: ciertos tipos de quimioterapia, tales como daunorubicina, [place], [place], e idarubicina Puede ser que esta lista no menciona todas las posibles interacciones. Informe a su profesional de KB Home	[place] de [employer] productos a base  de hierbas, medicamentos de [place] o suplementos nutritivos que est tomando. Si usted fuma, consume bebidas alcohlicas o si utiliza drogas ilegales, indqueselo tambin a su profesional de KB Home	[place]. Algunas sustancias pueden interactuar con su medicamento. A qu debo estar atento al usar Coca[employer]Cola? Visite a su mdico para chequear su evolucin peridicamente. Si presenta algn efecto secundario, infrmelo. Sin embargo, contine con el tratamiento aun si se siente enfermo, a menos que su mdico le indique que lo suspenda. Consulte a su mdico o a su profesional de la salud si tiene fiebre, escalofros, dolor de garganta, o cualquier otro sntoma de resfro o gripe. No se trate usted mismo. Trate de no acercarse a personas que estn enfermas. Es posible que tenga [place], escalofros y temblores durante su primera infusin. Estos efectos generalmente son leves y pueden tratarse con otros medicamentos. Informe cualquier efecto secundario durante la infusin a su profesional de [employer] Home	[place]. La fiebre y los escalofros por lo general no suceden con las infusiones posteriores. No debe quedar embarazada mientras est tomando este medicamento o por 7 meses despus de dejar de usarlo. Las mujeres deben informar a su mdico si estn buscando quedar embarazadas o si creen que estn embarazadas. Las mujeres con la posibilidad de Best boy nios deben tener una prueba de embarazo negativa antes de Art gallery manager a tomar este medicamento. Existe la posibilidad de que ocurran efectos secundarios graves a un beb sin nacer. Para ms informacin hable con su profesional de la salud o su farmacutico. No debe amamantar a un beb mientras est tomando este medicamento o por 7 meses despus de dejar de usarlo. Las [employer] deben usar un mtodo anticonceptivo eficaz con este medicamento. Qu efectos secundarios puedo tener al [employer] este medicamento? Efectos secundarios que debe informar a su mdico o  a su profesional de la  salud tan pronto como sea posible: Chief of Staff, como erupcin cutnea, comezn/picazn o urticaria, e hinchazn de la cara, los labios o la lengua dolor en el pecho o palpitaciones tos mareos sensacin de Youth worker o aturdimiento, cadas fiebre sensacin general de estar enfermo o sntomas gripales signos de empeoramiento de la insuficiencia cardiaca, tales como problemas respiratorios; hinchazn en las piernas y los pies cansancio o debilidad inusual Efectos secundarios que generalmente no requieren atencin mdica (infrmelos a su mdico o a Barrister's clerk de la salud si persisten o si son molestos): dolor de Psychiatric nurse sentido del gusto diarrea dolor en las articulaciones nuseas, vmito prdida de peso Puede ser que esta lista no menciona todos los posibles efectos secundarios. Comunquese a su mdico por asesoramiento mdico [employer] Usted puede informar los efectos secundarios a la FDA por telfono al 1-800-FDA-1088. Dnde debo guardar mi medicina? Este medicamento se administra en hospitales o clnicas y no necesitar guardarlo en su domicilio. ATENCIN: Este folleto es un resumen. Puede ser que no cubra toda la posible informacin. Si usted tiene preguntas acerca de esta medicina, consulte con su mdico, su farmacutico o su profesional de Technical sales engineer.  2020 Elsevier/Gold Standard (2016-05-18 00:00:00) Pertuzumab injection Qu es este medicamento? El PERTUZUMAB es un anticuerpo monoclonal. Genene Churn para tratar el cncer de mama. Este medicamento puede ser utilizado para otros usos; si tiene alguna pregunta consulte con su proveedor de atencin mdica o con su farmacutico. MARCAS COMUNES: PERJETA Qu le debo informar a mi profesional de la salud antes de tomar este medicamento? Necesita saber si usted presenta alguno de los siguientes problemas o situaciones: enfermedad cardiaca insuficiencia cardiaca alta presin sangunea antecedentes de pulso  cardiaca irregular radioterapia reciente o continuada una reaccin alrgica o inusual al pertuzumab, a otros medicamentos, alimentos, colorantes o conservantes si est embarazada o buscando quedar embarazada si est amamantando a un beb Cmo debo utilizar este medicamento? Este medicamento se administra mediante infusin por va intravenosa. Lo administra un profesional de Technical sales engineer en un hospital o en un entorno clnico. Hable con su pediatra para informarse acerca del uso de este medicamento en nios. Puede requerir atencin especial. Sobredosis: Pngase en contacto inmediatamente con un centro toxicolgico o una sala de urgencia si usted cree que haya tomado demasiado medicamento. ATENCIN: [employer] es solo para usted. No comparta este medicamento con nadie. Qu sucede si me olvido de una dosis? Es importante no olvidar ninguna dosis. Informe a su mdico o a su profesional de la salud si no puede asistir a Photographer. Qu puede interactuar con este medicamento? No se esperan interacciones. Puede ser que esta lista no menciona todas las posibles interacciones. Informe a su profesional de KB Home	[place] de [employer] productos a base de hierbas, medicamentos de [place] o suplementos nutritivos que est tomando. Si usted fuma, consume bebidas alcohlicas o si utiliza drogas ilegales, indqueselo tambin a su profesional de KB Home	[place]. Algunas sustancias pueden interactuar con su medicamento. A qu debo estar atento al usar Coca-Cola? Se supervisar su estado de salud atentamente mientras reciba este medicamento. Si presenta algn efecto secundario, infrmelo. Sin embargo, contine con el tratamiento aun si se siente enfermo, a menos que su mdico le indique que lo suspenda. No debe quedar embarazada mientras est tomando este medicamento o por 7 meses despus de dejar de usarlo. Las mujeres deben informar a su mdico si estn buscando quedar embarazadas o si creen que  estn embarazadas. Las  mujeres con la posibilidad de Best boy nios deben tener una prueba de embarazo negativa antes de Art gallery manager a tomar este medicamento. Existe la posibilidad de que ocurran efectos secundarios graves a un beb sin nacer. Para ms informacin hable con su profesional de la salud o su farmacutico. No debe amamantar a un beb mientras est tomando este medicamento o por 7 meses despus de dejar de usarlo. Las [employer] deben usar un mtodo anticonceptivo eficaz con este medicamento. Consulte a su mdico o a su profesional de la salud si tiene fiebre, escalofros, dolor de garganta, o cualquier otro sntoma de resfro o gripe. No se trate usted mismo. Trate de no acercarse a personas que estn enfermas. Es posible que tenga [place], escalofros y dolor de cabeza durante la infusin. Informe cualquier efecto secundario durante la infusin a su profesional de KB Home	[place]. Qu efectos secundarios puedo tener al [employer] este medicamento? Efectos secundarios que debe informar a su mdico o a Barrister's clerk de la salud tan pronto como sea posible: Arboriculturist o palpitaciones mareos sensacin de Youth worker o aturdimiento fiebre o escalofros erupcin cutnea, picazn o urticaria dolor de garganta hinchazn de la cara, labios o lengua hinchazn de piernas o tobillos cansancio o debilidad inusual Efectos secundarios que generalmente no requieren atencin mdica (infrmelos a su mdico o a su profesional de la salud si persisten o si son molestos): diarrea cada del cabello nuseas, vmito cansancio Puede ser que esta lista no menciona todos los posibles efectos secundarios. Comunquese a su mdico por asesoramiento mdico [employer] Usted puede informar los efectos secundarios a la FDA por telfono al 1-800-FDA-1088. Dnde debo guardar mi medicina? Este medicamento se administra en hospitales o clnicas y no necesitar guardarlo en su domicilio. ATENCIN: Este folleto es un  resumen. Puede ser que no cubra toda la posible informacin. Si usted tiene preguntas acerca de esta medicina, consulte con su mdico, su farmacutico o su profesional de Technical sales engineer.  2020 Elsevier/Gold Standard (2016-04-06 00:00:00) Paclitaxel injection Qu es este medicamento? El PACLITAXEL es un agente quimioteraputico. Este medicamento acta sobre las clulas que se dividen rpidamente, como las clulas cancerosas, y finalmente provoca la muerte de estas clulas. Se utiliza en el tratamiento del cncer de ovario, mama, pulmn, sarcoma de [place] y otros tipos de cncer. Este medicamento puede ser utilizado para otros usos; si tiene alguna pregunta consulte con su proveedor de atencin mdica o con su farmacutico. MARCAS COMUNES: Onxol, Taxol Qu le debo informar a mi profesional de la salud antes de tomar este medicamento? Necesitan saber si usted presenta alguno de los siguientes problemas o situaciones: antecedentes de ritmo cardiaco irregular enfermedad heptica recuentos sanguneos bajos, como baja cantidad de glbulos blancos, plaquetas o glbulos rojos enfermedad pulmonar o respiratoria, como asma hormigueo en las manos o los pies, u otro trastorno del sistema nervioso una reaccin alrgica o inusual al paclitaxel, al alcohol, al aceite de ricino polioxietilado, a otros medicamentos quimioteraputicos, a otros medicamentos, alimentos, colorantes o conservantes si est embarazada o buscando quedar embarazada si est amamantando a un beb Cmo debo [employer]? Este medicamento se administra como infusin en una vena. Un profesional de la salud especialmente capacitado lo administra en un hospital o clnica. Hable con su pediatra para informarse acerca del uso de este medicamento en nios. Puede requerir atencin especial. Sobredosis: Pngase en contacto inmediatamente con un centro toxicolgico o una sala de urgencia si usted cree que haya tomado demasiado  medicamento. ATENCIN: [employer] es solo para usted. No comparta este medicamento con nadie. Qu sucede si me olvido de una dosis? Es importante no olvidar ninguna dosis. Informe a su mdico o a su profesional de la salud si no puede asistir a Photographer. Qu puede interactuar con este medicamento? No tome este medicamento con ninguno de los siguientes frmacos: disulfiram metronidazol Esta medicina tambin puede interactuar con los siguientes medicamentos: medicamentos antivirales para la hepatitis, VIH o SIDA ciertos antibiticos, tales como eritromicina y Ambulance person ciertos medicamentos para infecciones micticas, tales como itraconazol y ketoconazol ciertos medicamentos para convulsiones, tales como [place], fenobarbital y fenitona gemfibrozil nefazodona rifampicina hierba de [place] Puede ser que esta lista no menciona todas las posibles interacciones. Informe a su profesional de KB Home	[place] de [employer] productos a base de hierbas, medicamentos de [place] o suplementos nutritivos que est tomando. Si usted fuma, consume bebidas alcohlicas o si utiliza drogas ilegales, indqueselo tambin a su profesional de KB Home	[place]. Algunas sustancias pueden interactuar con su medicamento. A qu debo estar atento al usar Coca[employer]Cola? Se supervisar su estado de salud atentamente mientras reciba este medicamento. Tendr que hacerse anlisis de sangre importantes mientras est tomando este medicamento. Este medicamento puede causar Chief of Staff graves. Para reducir su riesgo, necesitar tomar otro(s) medicamento(s) antes del tratamiento con este medicamento. Si tiene [employer] erupcin cutnea, comezn/picazn o urticaria, hinchazn del rostro, los labios, o la [place], informe de inmediato a su mdico o profesional de Technical sales engineer. En algunos casos, podra recibir [employer] para ayudarlo con los efectos secundarios. Siga todas las instrucciones para  usarlos. Este medicamento podra hacerle sentir un Nurse, mental health. Esto es normal ya que la quimioterapia afecta tanto a las clulas sanas como a las clulas cancerosas. Si presenta algn efecto secundario, infrmelo. Sin embargo, contine con el tratamiento aun si se siente enfermo, a menos que su mdico le indique que lo suspenda. Consulte a su mdico o a su profesional de la salud si tiene fiebre, escalofros o dolor de garganta, o cualquier otro sntoma de resfro o gripe. No se trate usted mismo. Este medicamento reduce la capacidad del cuerpo para combatir infecciones. Trate de no acercarse a personas que estn enfermas. Este medicamento podra aumentar el riesgo de moretones o sangrado. Consulte a su mdico o a su profesional de la salud si observa sangrados inusuales. Proceda con cuidado al cepillar sus dientes, usar hilo dental o Risk manager palillos para los dientes, ya que podra contraer una infeccin o Therapist, art con mayor facilidad. Si se somete a algn tratamiento dental, informe a su dentista que est [employer]. Evite tomar productos que contienen aspirina, acetaminofeno, ibuprofeno, naproxeno o ketoprofeno, a menos que as lo indique su mdico. Estos productos pueden ocultar la fiebre. No debe quedar embarazada mientras reciba este medicamento. Las mujeres deben informar a su mdico si estn buscando quedar embarazadas o si creen que estn embarazadas. Existe la posibilidad de efectos secundarios graves en un beb sin nacer. Para obtener ms informacin, hable con su profesional de la salud o su farmacutico. No debe amamantar a un beb mientras est tomando este medicamento. Para los hombres, se desaconseja tener nios mientras reciben Coca[employer]Cola. Este producto podra contener alcohol. Pregunte a Midwife o a su proveedor de atencin de la salud si este medicamento contiene alcohol. Asegrese de decirles a todos los proveedores de atencin de la salud que usted est  tomando [place]. Ciertos medicamentos, como metronidazol y disulfiram, pueden causar  una reaccin desagradable cuando se toman con alcohol. Esta reaccin incluye enrojecimiento, dolor de cabeza, nuseas, vmitos, sudoracin y aumento de la sed. La reaccin puede durar de 30 minutos a varias horas. Qu efectos secundarios puedo tener al [employer] este medicamento? Efectos secundarios que debe informar a su mdico o a Barrister's clerk de la salud tan pronto como sea posible: Chief of Staff, como erupcin cutnea, comezn/picazn o urticaria, e hinchazn de la cara, los labios o la lengua problemas respiratorios cambios en la visin ritmo cardiaco rpido, irregular presin sangunea alta o baja llagas en la boca dolor, hormigueo o entumecimiento de las manos o los pies signos de disminucin en la cantidad de plaquetas o sangrado: moretones, puntos rojos en la piel, heces de color negro y aspecto alquitranado, sangre en la orina signos de disminucin en la cantidad de glbulos rojos: cansancio o debilidad inusual, sensacin de desmayo o aturdimiento, cadas signos de infeccin: fiebre o escalofros, tos, dolor de garganta, dolor o dificultad para orinar signos y sntomas de lesin al hgado, como orina amarilla oscura o [place]; sensacin general de estar enfermo o sntomas gripales; heces claras; prdida del apetito; nuseas; dolor en la regin abdominal superior derecha; cansancio o debilidad inusual; color amarillento de los ojos o la piel hinchazn de tobillos, pies, manos ritmo cardiaco inusualmente lento Efectos secundarios que generalmente no requieren atencin mdica (infrmelos a su mdico o a su profesional de la salud si persisten o si son molestos): diarrea cada del cabello prdida del apetito dolores musculares o articulares nuseas, vmito dolor, enrojecimiento o Actor de la inyeccin cansancio Puede ser que esta lista no menciona todos los posibles efectos secundarios.  Comunquese a su mdico por asesoramiento mdico [employer] Usted puede informar los efectos secundarios a la FDA por telfono al 1-800-FDA-1088. Dnde debo guardar mi medicina? Este medicamento se administra en hospitales o clnicas y no necesitar guardarlo en su domicilio. ATENCIN: Este folleto es un resumen. Puede ser que no cubra toda la posible informacin. Si usted tiene preguntas acerca de esta medicina, consulte con su mdico, su farmacutico o su profesional de Technical sales engineer.  2020 Elsevier/Gold Standard (2016-12-28 00:00:00) Trastuzumab injection for infusion What is this medicine? TRASTUZUMAB (tras TOO zoo mab) is a monoclonal antibody. It is used to treat breast cancer and stomach cancer. This medicine may be used for other purposes; ask your health care provider or pharmacist if you have questions. COMMON BRAND NAME(S): Herceptin, Galvin Proffer, Trazimera What should I tell my health care provider before I take this medicine? They need to know if you have any of these conditions:  heart disease  heart failure  lung or breathing disease, like asthma  an unusual or allergic reaction to trastuzumab, benzyl alcohol, or other medications, foods, dyes, or preservatives  pregnant or trying to get pregnant  breast-feeding How should I use this medicine? This drug is given as an infusion into a vein. It is administered in a hospital or clinic by a specially trained health care professional. Talk to your pediatrician regarding the use of this medicine in children. This medicine is not approved for use in children. Overdosage: If you think you have taken too much of this medicine contact a poison control center or emergency room at once. NOTE: This medicine is only for you. Do not share this medicine with others. What if I miss a dose? It is important not to miss a dose. Call your doctor or health care professional if  you are unable to keep an  appointment. What may interact with this medicine? This medicine may interact with the following medications:  certain types of chemotherapy, such as daunorubicin, doxorubicin, epirubicin, and idarubicin This list may not describe all possible interactions. Give your health care provider a list of all the medicines, herbs, non-prescription drugs, or dietary supplements you use. Also tell them if you smoke, drink alcohol, or use illegal drugs. Some items may interact with your medicine. What should I watch for while using this medicine? Visit your doctor for checks on your progress. Report any side effects. Continue your course of treatment even though you feel ill unless your doctor tells you to stop. Call your doctor or health care professional for advice if you get a fever, chills or sore throat, or other symptoms of a cold or flu. Do not treat yourself. Try to avoid being around people who are sick. You may experience fever, chills and shaking during your first infusion. These effects are usually mild and can be treated with other medicines. Report any side effects during the infusion to your health care professional. Fever and chills usually do not happen with later infusions. Do not become pregnant while taking this medicine or for 7 months after stopping it. Women should inform their doctor if they wish to become pregnant or think they might be pregnant. Women of child-bearing potential will need to have a negative pregnancy test before starting this medicine. There is a potential for serious side effects to an unborn child. Talk to your health care professional or pharmacist for more information. Do not breast-feed an infant while taking this medicine or for 7 months after stopping it. Women must use effective birth control with this medicine. What side effects may I notice from receiving this medicine? Side effects that you should report to your doctor or health care professional as soon as  possible:  allergic reactions like skin rash, itching or hives, swelling of the face, lips, or tongue  chest pain or palpitations  cough  dizziness  feeling faint or lightheaded, falls  fever  general ill feeling or flu-like symptoms  signs of worsening heart failure like breathing problems; swelling in your legs and feet  unusually weak or tired Side effects that usually do not require medical attention (report to your doctor or health care professional if they continue or are bothersome):  bone pain  changes in taste  diarrhea  joint pain  nausea/vomiting  weight loss This list may not describe all possible side effects. Call your doctor for medical advice about side effects. You may report side effects to FDA at 1-800-FDA-1088. Where should I keep my medicine? This drug is given in a hospital or clinic and will not be stored at home. NOTE: This sheet is a summary. It may not cover all possible information. If you have questions about this medicine, talk to your doctor, pharmacist, or health care provider.  2020 Elsevier/Gold Standard (2016-02-29 14:37:52) Pertuzumab injection What is this medicine? PERTUZUMAB (per TOOZ ue mab) is a monoclonal antibody. It is used to treat breast cancer. This medicine may be used for other purposes; ask your health care provider or pharmacist if you have questions. COMMON BRAND NAME(S): PERJETA What should I tell my health care provider before I take this medicine? They need to know if you have any of these conditions:  heart disease  heart failure  high blood pressure  history of irregular heart beat  recent or ongoing radiation  therapy  an unusual or allergic reaction to pertuzumab, other medicines, foods, dyes, or preservatives  pregnant or trying to get pregnant  breast-feeding How should I use this medicine? This medicine is for infusion into a vein. It is given by a health care professional in a hospital or clinic  setting. Talk to your pediatrician regarding the use of this medicine in children. Special care may be needed. Overdosage: If you think you have taken too much of this medicine contact a poison control center or emergency room at once. NOTE: This medicine is only for you. Do not share this medicine with others. What if I miss a dose? It is important not to miss your dose. Call your doctor or health care professional if you are unable to keep an appointment. What may interact with this medicine? Interactions are not expected. Give your health care provider a list of all the medicines, herbs, non-prescription drugs, or dietary supplements you use. Also tell them if you smoke, drink alcohol, or use illegal drugs. Some items may interact with your medicine. This list may not describe all possible interactions. Give your health care provider a list of all the medicines, herbs, non-prescription drugs, or dietary supplements you use. Also tell them if you smoke, drink alcohol, or use illegal drugs. Some items may interact with your medicine. What should I watch for while using this medicine? Your condition will be monitored carefully while you are receiving this medicine. Report any side effects. Continue your course of treatment even though you feel ill unless your doctor tells you to stop. Do not become pregnant while taking this medicine or for 7 months after stopping it. Women should inform their doctor if they wish to become pregnant or think they might be pregnant. Women of child-bearing potential will need to have a negative pregnancy test before starting this medicine. There is a potential for serious side effects to an unborn child. Talk to your health care professional or pharmacist for more information. Do not breast-feed an infant while taking this medicine or for 7 months after stopping it. Women must use effective birth control with this medicine. Call your doctor or health care professional for  advice if you get a fever, chills or sore throat, or other symptoms of a cold or flu. Do not treat yourself. Try to avoid being around people who are sick. You may experience fever, chills, and headache during the infusion. Report any side effects during the infusion to your health care professional. What side effects may I notice from receiving this medicine? Side effects that you should report to your doctor or health care professional as soon as possible:  breathing problems  chest pain or palpitations  dizziness  feeling faint or lightheaded  fever or chills  skin rash, itching or hives  sore throat  swelling of the face, lips, or tongue  swelling of the legs or ankles  unusually weak or tired Side effects that usually do not require medical attention (report to your doctor or health care professional if they continue or are bothersome):  diarrhea  hair loss  nausea, vomiting  tiredness This list may not describe all possible side effects. Call your doctor for medical advice about side effects. You may report side effects to FDA at 1-800-FDA-1088. Where should I keep my medicine? This drug is given in a hospital or clinic and will not be stored at home. NOTE: This sheet is a summary. It may not cover all possible information. If  you have questions about this medicine, talk to your doctor, pharmacist, or health care provider.  2020 Elsevier/Gold Standard (2015-04-08 12:08:50) Paclitaxel injection What is this medicine? PACLITAXEL (PAK li TAX el) is a chemotherapy drug. It targets fast dividing cells, like cancer cells, and causes these cells to die. This medicine is used to treat ovarian cancer, breast cancer, lung cancer, Kaposi's sarcoma, and other cancers. This medicine may be used for other purposes; ask your health care provider or pharmacist if you have questions. COMMON BRAND NAME(S): Onxol, Taxol What should I tell my health care provider before I take this  medicine? They need to know if you have any of these conditions:  history of irregular heartbeat  liver disease  low blood counts, like low white cell, platelet, or red cell counts  lung or breathing disease, like asthma  tingling of the fingers or toes, or other nerve disorder  an unusual or allergic reaction to paclitaxel, alcohol, polyoxyethylated castor oil, other chemotherapy, other medicines, foods, dyes, or preservatives  pregnant or trying to get pregnant  breast-feeding How should I use this medicine? This drug is given as an infusion into a vein. It is administered in a hospital or clinic by a specially trained health care professional. Talk to your pediatrician regarding the use of this medicine in children. Special care may be needed. Overdosage: If you think you have taken too much of this medicine contact a poison control center or emergency room at once. NOTE: This medicine is only for you. Do not share this medicine with others. What if I miss a dose? It is important not to miss your dose. Call your doctor or health care professional if you are unable to keep an appointment. What may interact with this medicine? Do not take this medicine with any of the following medications:  disulfiram  metronidazole This medicine may also interact with the following medications:  antiviral medicines for hepatitis, HIV or AIDS  certain antibiotics like erythromycin and clarithromycin  certain medicines for fungal infections like ketoconazole and itraconazole  certain medicines for seizures like carbamazepine, phenobarbital, phenytoin  gemfibrozil  nefazodone  rifampin  St. John's wort This list may not describe all possible interactions. Give your health care provider a list of all the medicines, herbs, non-prescription drugs, or dietary supplements you use. Also tell them if you smoke, drink alcohol, or use illegal drugs. Some items may interact with your  medicine. What should I watch for while using this medicine? Your condition will be monitored carefully while you are receiving this medicine. You will need important blood work done while you are taking this medicine. This medicine can cause serious allergic reactions. To reduce your risk you will need to take other medicine(s) before treatment with this medicine. If you experience allergic reactions like skin rash, itching or hives, swelling of the face, lips, or tongue, tell your doctor or health care professional right away. In some cases, you may be given additional medicines to help with side effects. Follow all directions for their use. This drug may make you feel generally unwell. This is not uncommon, as chemotherapy can affect healthy cells as well as cancer cells. Report any side effects. Continue your course of treatment even though you feel ill unless your doctor tells you to stop. Call your doctor or health care professional for advice if you get a fever, chills or sore throat, or other symptoms of a cold or flu. Do not treat yourself. This drug decreases your  body's ability to fight infections. Try to avoid being around people who are sick. This medicine may increase your risk to bruise or bleed. Call your doctor or health care professional if you notice any unusual bleeding. Be careful brushing and flossing your teeth or using a toothpick because you may get an infection or bleed more easily. If you have any dental work done, tell your dentist you are receiving this medicine. Avoid taking products that contain aspirin, acetaminophen, ibuprofen, naproxen, or ketoprofen unless instructed by your doctor. These medicines may hide a fever. Do not become pregnant while taking this medicine. Women should inform their doctor if they wish to become pregnant or think they might be pregnant. There is a potential for serious side effects to an unborn child. Talk to your health care professional or  pharmacist for more information. Do not breast-feed an infant while taking this medicine. Men are advised not to father a child while receiving this medicine. This product may contain alcohol. Ask your pharmacist or healthcare provider if this medicine contains alcohol. Be sure to tell all healthcare providers you are taking this medicine. Certain medicines, like metronidazole and disulfiram, can cause an unpleasant reaction when taken with alcohol. The reaction includes flushing, headache, nausea, vomiting, sweating, and increased thirst. The reaction can last from 30 minutes to several hours. What side effects may I notice from receiving this medicine? Side effects that you should report to your doctor or health care professional as soon as possible:  allergic reactions like skin rash, itching or hives, swelling of the face, lips, or tongue  breathing problems  changes in vision  fast, irregular heartbeat  high or low blood pressure  mouth sores  pain, tingling, numbness in the hands or feet  signs of decreased platelets or bleeding - bruising, pinpoint red spots on the skin, black, tarry stools, blood in the urine  signs of decreased red blood cells - unusually weak or tired, feeling faint or lightheaded, falls  signs of infection - fever or chills, cough, sore throat, pain or difficulty passing urine  signs and symptoms of liver injury like dark yellow or brown urine; general ill feeling or flu-like symptoms; light-colored stools; loss of appetite; nausea; right upper belly pain; unusually weak or tired; yellowing of the eyes or skin  swelling of the ankles, feet, hands  unusually slow heartbeat Side effects that usually do not require medical attention (report to your doctor or health care professional if they continue or are bothersome):  diarrhea  hair loss  loss of appetite  muscle or joint pain  nausea, vomiting  pain, redness, or irritation at site where  injected  tiredness This list may not describe all possible side effects. Call your doctor for medical advice about side effects. You may report side effects to FDA at 1-800-FDA-1088. Where should I keep my medicine? This drug is given in a hospital or clinic and will not be stored at home. NOTE: This sheet is a summary. It may not cover all possible information. If you have questions about this medicine, talk to your doctor, pharmacist, or health care provider.  2020 Elsevier/Gold Standard (2016-11-07 13:14:55)

## 2019-09-02 NOTE — Evaluation (Signed)
Physical Therapy Re-Evaluation Patient Details Name: Sophia [name] MRN: [id number] DOB: Mar 05, 1981 Today's Date: 09/02/2019   History of Present Illness  39 yo female with onset of severe back pain was admitted, found mult pathological spinal fractures from mets after recent dx with primary breast CA.  Had kyphoplasty on 08/29/19, for T6, L1 and L4.  Pain now relieved by procedure, but residual pain on R hip from mets.  Resolved leukocytosis, but ongoing issues of weakness and discomfort from mets in R hip and leg.  She is now also s/p R sacroplasty and R hip ORIF IM nail 6/14.  Clinical Impression  Pt shows great effort with exercises and with ambulation despite some pain and significant weakness.  She was able to ambulate ~60 ft with slow, but every improving cadence.  Pt with significant weakness in R LE along with some numbness and associated tibial area pain.      Follow Up Recommendations Home health PT    Equipment Recommendations  Rolling walker with 5" wheels;3in1 (PT)    Recommendations for Other Services       Precautions / Restrictions Precautions Precautions: Fall;Back Restrictions Weight Bearing Restrictions: Yes RLE Weight Bearing: Weight bearing as tolerated      Mobility  Bed Mobility Overal bed mobility: Needs Assistance Bed Mobility: Supine to Sit Rolling: Min assist Sidelying to sit: Min assist       General bed mobility comments: Pt showed good effort in trying to get herself to EOB, but ultimately needed direct assist  Transfers Overall transfer level: Needs assistance Equipment used: Rolling walker (2 wheeled);1 person hand held assist Transfers: Sit to/from Stand Sit to Stand: Min assist         General transfer comment: Pt needed heavy UE use, but was able to rise w/o direct assist  Ambulation/Gait Ambulation/Gait assistance: Min guard Gait Distance (Feet): 65 Feet Assistive device: Rolling walker (2 wheeled)       General Gait  Details: Pt initially with L LE trailing behind and considerable hesitancy with R WBing, but with cuing and increased practice she was able to utilize UEs/walker well and showed increased WBing tolerance on R and therefore step through on L.    Stairs            Wheelchair Mobility    Modified Rankin (Stroke Patients Only)       Balance Overall balance assessment: Needs assistance Sitting-balance support: Feet supported;Single extremity supported Sitting balance-Leahy Scale: Fair Sitting balance - Comments: pt leaning away from R hip while sitting at EOB, uses UEs to assist with unweighting   Standing balance support: Bilateral upper extremity supported Standing balance-Leahy Scale: Fair Standing balance comment: With walker pt had no LOBs, though she was highly reliant on it especially with R WBing                             Pertinent Vitals/Pain Pain Assessment: 0-10 Pain Score: 3  Pain Location: Pt reports relatively low pain (did time session post pain meds) but with sitting on R hip pt clearly leaning away secondary to pain    Home Living Family/patient expects to be discharged to:: Private residence Living Arrangements: Spouse/significant other Available Help at Discharge: Family;Friend(s);Available 24 hours/day (sisters have been helping out during the day) Type of Home: House Home Access: Level entry     Home Layout: One level Home Equipment: None Additional Comments: fully independent and drove before pain started  Prior Function Level of Independence: Independent               Hand Dominance        Extremity/Trunk Assessment   Upper Extremity Assessment Upper Extremity Assessment: Generalized weakness    Lower Extremity Assessment Lower Extremity Assessment: RLE deficits/detail RLE Deficits / Details: No AROM SLRs, 3/5 Abd, grossly 3+/5 hip flex, Ad, 4/5 at knee and ankle RLE Sensation:  (numb/pain along tib anterior) LLE  Deficits / Details: WFL t/o, does report tib anterior numbness on L       Communication   Communication: Prefers language other than English (speaks English well, does not request interpreter)  Cognition Arousal/Alertness: Awake/alert Behavior During Therapy: WFL for tasks assessed/performed Overall Cognitive Status: Within Functional Limits for tasks assessed                                        General Comments      Exercises General Exercises - Lower Extremity Ankle Circles/Pumps: AROM;10 reps Quad Sets: Strengthening;10 reps Short Arc Quad: AROM;Strengthening;10 reps Heel Slides: AROM;10 reps;Strengthening (resisted leg extensions) Hip ABduction/ADduction: AROM;Strengthening;10 reps Straight Leg Raises: PROM;10 reps   Assessment/Plan    PT Assessment Patient needs continued PT services  PT Problem List Decreased strength;Decreased range of motion;Decreased activity tolerance;Decreased balance;Decreased mobility;Decreased coordination;Decreased knowledge of use of DME;Pain       PT Treatment Interventions DME instruction;Gait training;Functional mobility training;Therapeutic activities;Balance training;Therapeutic exercise;Neuromuscular re-education;Patient/family education    PT Goals (Current goals can be found in the Care Plan section)  Acute Rehab PT Goals Patient Stated Goal: to get back to walking well, go home PT Goal Formulation: With patient Time For Goal Achievement: 09/16/19 Potential to Achieve Goals: Good    Frequency 7X/week   Barriers to discharge        Co-evaluation               AM-PAC PT "6 Clicks" Mobility  Outcome Measure Help needed turning from your back to your side while in a flat bed without using bedrails?: A Little Help needed moving from lying on your back to sitting on the side of a flat bed without using bedrails?: A Little Help needed moving to and from a bed to a chair (including a wheelchair)?: A  Little Help needed standing up from a chair using your arms (e.g., wheelchair or bedside chair)?: A Little Help needed to walk in hospital room?: A Little Help needed climbing 3-5 steps with a railing? : A Lot 6 Click Score: 17    End of Session Equipment Utilized During Treatment: Gait belt Activity Tolerance: Patient limited by fatigue Patient left: with call bell/phone within reach;with chair alarm set Nurse Communication: Mobility status PT Visit Diagnosis: Unsteadiness on feet (R26.81);Pain;Muscle weakness (generalized) (M62.81);Other abnormalities of gait and mobility (R26.89);Difficulty in walking, not elsewhere classified (R26.2) Pain - Right/Left: Right Pain - part of body: Hip    Time: 1100-1145 PT Time Calculation (min) (ACUTE ONLY): 45 min   Charges:   PT Evaluation $PT Re-evaluation: 1 Re-eval PT Treatments $Gait Training: 8-22 mins $Therapeutic Exercise: 8-22 mins        Kreg Shropshire, DPT 09/02/2019, 1:36 PM

## 2019-09-02 NOTE — Progress Notes (Signed)
   Subjective: 1 Day Post-Op Procedure(s) (LRB): Right Sacral Radiofrequency Ablation and Cement Augmentation, Right sacrum and iliac crest (Right) INTRAMEDULLARY (IM) NAIL INTERTROCHANTRIC AND RADIOFREQUENCY ABLATION (Right) Patient reports pain as mild.   Patient is well, and has had no acute complaints or problems Denies any CP, SOB, ABD pain. We will continue therapy today.   Objective: Vital signs in last 24 hours: Temp:  [97.3 F (36.3 C)-98.3 F (36.8 C)] 98.3 F (36.8 C) (06/15 0412) Pulse Rate:  [64-99] 78 (06/15 0412) Resp:  [10-20] 18 (06/15 0412) BP: (121-144)/(78-94) 121/81 (06/15 0412) SpO2:  [92 %-100 %] 98 % (06/15 0412)  Intake/Output from previous day: 06/14 0701 - 06/15 0700 In: 1854.2 [I.V.:1638.9; IV Piggyback:215.3] Out: 800 [Urine:800] Intake/Output this shift: No intake/output data recorded.  Recent Labs    08/31/19 0443 09/01/19 0653 09/01/19 1754 09/02/19 0556  HGB 9.2* 13.4 13.4 11.4*   Recent Labs    09/01/19 1754 09/02/19 0556  WBC 37.6* 23.5*  RBC 4.55 3.80*  HCT 39.9 33.4*  PLT 341 290   Recent Labs    09/01/19 0653 09/01/19 0653 09/01/19 1754 09/02/19 0556  NA 137  --   --  136  K 3.8  --   --  3.9  CL 103  --   --  103  CO2 26  --   --  25  BUN 12  --   --  13  CREATININE 0.38*   < > 0.71 0.47  GLUCOSE 101*  --   --  141*  CALCIUM 8.6*  --   --  8.0*   < > = values in this interval not displayed.   No results for input(s): LABPT, INR in the last 72 hours.  EXAM General - Patient is Alert, Appropriate and Oriented Extremity - Neurovascular intact Sensation intact distally Intact pulses distally Dorsiflexion/Plantar flexion intact No cellulitis present Compartment soft Dressing - dressing C/D/I and no drainage Motor Function - intact, moving foot and toes well on exam.   Past Medical History:  Diagnosis Date  . Anxiety   . Cancer ([place])   . 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   Weight as of this encounter: 60.8 kg. Advance diet Up with therapy  Labs and VSS Pain well controlled   Patient will need follow up with [place] in 2 weeks Keep incision site clean and dry Lovenox 40 mg sq daily x 14 days at discharge TED hose BLE x 6 weeks   DVT Prophylaxis - Lovenox, Foot Pumps and TED hose Weight-Bearing as tolerated to right leg   T. Rachelle Hora, PA-C [place] 09/02/2019, 8:19 AM

## 2019-09-02 NOTE — Progress Notes (Signed)
Physical Therapy Treatment Patient Details Name: Sophia [name] MRN: [id number] DOB: 1981-02-01 Today's Date: 09/02/2019    History of Present Illness 39 yo female with onset of severe back pain was admitted, found mult pathological spinal fractures from mets after recent dx with primary breast CA.  Had kyphoplasty on 08/29/19, for T6, L1 and L4.  Pain now relieved by procedure, but residual pain on R hip from mets.  Resolved leukocytosis, but ongoing issues of weakness and discomfort from mets in R hip and leg.  She is now also s/p R sacroplasty and R hip ORIF IM nail 6/14.    PT Comments    Pt continues to be very motivated and was eager to try and walk 100 ft this afternoon, which she did with great effort and a good bit of fatigue.  O2 remained in the high 90s t/o the effort on room air but HR did reach ~120 with sweating and considerable subjective fatigue.  She has very weak R hip flexion and is unable to actively do a lot with this (both with exercises and more functionally limiting during ambulation leading to short, labored cadence).  Overall pt is highly motivated and given multiple sx and overall diagnosis is doing quite well.  Pt with medial thigh and leg pain that is not necessarily typical with this surgery but again highly motivated and eager to work with PT as much as she can.    Follow Up Recommendations  Home health PT     Equipment Recommendations  Rolling walker with 5" wheels;3in1 (PT)    Recommendations for Other Services       Precautions / Restrictions Precautions Precautions: Fall;Back Restrictions Weight Bearing Restrictions: Yes RLE Weight Bearing: Weight bearing as tolerated    Mobility  Bed Mobility Overal bed mobility: Needs Assistance Bed Mobility: Supine to Sit Rolling: Min assist Sidelying to sit: Min assist       General bed mobility comments: in recliner pre and post session  Transfers Overall transfer level: Needs  assistance Equipment used: Rolling walker (2 wheeled);1 person hand held assist Transfers: Sit to/from Stand Sit to Stand: Min assist;Min guard         General transfer comment: Pt unable to get to standing independently with UEs pushing from arm rest, able to "appropriately" get b/l UEs pushing straight down on walker and rising with slow and labored effort w/o direct assist  Ambulation/Gait Ambulation/Gait assistance: Min guard Gait Distance (Feet): 100 Feet Assistive device: Rolling walker (2 wheeled)       General Gait Details: Pt did well with ambulation, but does continues to have difficulty lifting R LE to confidently take more than a small, shuffling step.  She showed great motivation and overall effort but was inconsistent with reciprocal vs step-to on L.  Pt increasingly more fatigued in UEs with increased distance.  O2 in the high 90s t/o the effort, HR increases quickly to ~100 and then gradually increases to nearly 120 and she has considerably subjective fatigue/sweating.   Stairs             Wheelchair Mobility    Modified Rankin (Stroke Patients Only)       Balance Overall balance assessment: Needs assistance Sitting-balance support: Feet supported;Single extremity supported Sitting balance-Leahy Scale: Fair Sitting balance - Comments: Pt continues to lean away from R in sacral WBing - able to balance per ability to tolerate pain   Standing balance support: Bilateral upper extremity supported Standing balance-Leahy Scale: [place] Standing  balance comment: With walker pt had no LOBs, though she was highly reliant on it especially with R WBing                            Cognition Arousal/Alertness: Awake/alert Behavior During Therapy: WFL for tasks assessed/performed Overall Cognitive Status: Within Functional Limits for tasks assessed                                        Exercises General Exercises - Lower Extremity Ankle  Circles/Pumps: AROM;10 reps Quad Sets: Strengthening;10 reps Short Arc Quad: Strengthening;10 reps Heel Slides: AROM;10 reps;Strengthening (resisted leg extensions) Hip ABduction/ADduction: AROM;Strengthening;10 reps Straight Leg Raises: PROM;10 reps    General Comments        Pertinent Vitals/Pain Pain Assessment: 0-10 Pain Score: 5  Pain Location: reports more pain t/o the thigh and even more so in medial thigh and leg    Home Living Family/patient expects to be discharged to:: Private residence Living Arrangements: Spouse/significant other Available Help at Discharge: Family;Friend(s);Available 24 hours/day (sisters have been helping out during the day) Type of Home: House Home Access: Level entry   Home Layout: One level Home Equipment: None Additional Comments: fully independent and drove before pain started    Prior Function Level of Independence: Independent          PT Goals (current goals can now be found in the care plan section) Acute Rehab PT Goals Patient Stated Goal: to get back to walking well, go home PT Goal Formulation: With patient Time For Goal Achievement: 09/16/19 Potential to Achieve Goals: Good Progress towards PT goals: Progressing toward goals    Frequency    7X/week      PT Plan Current plan remains appropriate    Co-evaluation              AM-PAC PT "6 Clicks" Mobility   Outcome Measure  Help needed turning from your back to your side while in a flat bed without using bedrails?: A Little Help needed moving from lying on your back to sitting on the side of a flat bed without using bedrails?: A Little Help needed moving to and from a bed to a chair (including a wheelchair)?: A Little Help needed standing up from a chair using your arms (e.g., wheelchair or bedside chair)?: A Little Help needed to walk in hospital room?: A Little Help needed climbing 3-5 steps with a railing? : A Lot 6 Click Score: 17    End of Session  Equipment Utilized During Treatment: Gait belt Activity Tolerance: Patient limited by fatigue Patient left: with call bell/phone within reach;with chair alarm set Nurse Communication: Mobility status PT Visit Diagnosis: Unsteadiness on feet (R26.81);Pain;Muscle weakness (generalized) (M62.81);Other abnormalities of gait and mobility (R26.89);Difficulty in walking, not elsewhere classified (R26.2) Pain - Right/Left: Right Pain - part of body: Hip     Time: 8469-6295 PT Time Calculation (min) (ACUTE ONLY): 32 min  Charges:  $Gait Training: 8-22 mins $Therapeutic Exercise: 8-22 mins                     Kreg Shropshire, DPT 09/02/2019, 3:55 PM

## 2019-09-02 NOTE — Progress Notes (Signed)
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  Patient also had port placement by Dr.Dew earlier that day.  ED Course: Afebrile, WBC 16K Hospital course: Patient admitted to [facility] for further evaluation and management. Kyphoplasty with ablation scheduled after EDP discussed with oncologist, Dr. Janese Banks, neurosurgeon Dr. Cari Caraway and orthopedist Dr. Rudene Christians as patient not felt to be a good candidate for instrumented fusion.   Patient also planned for prophylactic fixation of the right hip as well as radiofrequency ablation of the sacral metastatic region as inpatient.   Subjective:  Patient out of bed to chair when seen in rounds this afternoon, states pain controlled with p.o./IV medications but lost her IV access earlier today.  Eating well..  She is reporting pain along the right hip at 5/10.   Objective: Vitals:   09/01/19 2354 09/02/19 0412 09/02/19 1219 09/02/19 1539  BP: 123/83 121/81 138/82 123/86  Pulse: 79 78 98 95  Resp: '18 18 20 18  '$ Temp: 98.2 F (36.8 C) 98.3 F (36.8 C) 98.3 F (36.8 C) 99 F (37.2 C)  TempSrc: Oral Oral Oral Oral  SpO2: 98% 98% 97% 98%  Weight:      Height:        Intake/Output Summary (Last 24 hours) at 09/02/2019 1722 Last data filed at 09/02/2019 1330 Gross per 24 hour  Intake 2094.19 ml  Output 800 ml  Net 1294.19 ml   Filed Weights   08/28/19 1938  Weight: 60.8 kg    Physical Examination:  General exam: Appears calm and no acute distress.     Respiratory system: Clear to auscultation. Respiratory effort normal. Cardiovascular system: S1 & S2 heard, RRR. No JVD, murmurs, rubs, gallops or clicks. No pedal edema. Gastrointestinal system: Abdomen is nondistended, soft and nontender. Normal bowel sounds heard. Central nervous system: Alert and oriented. No new focal neurological deficits. Extremities: Postop dressing along right hip/thigh.  Sutures along Port-A-Cath entry site/neck Skin: Healing surgical scar from port placement along right subclavicular area. Psychiatry: Judgement and insight appear normal. Mood & affect appropriate.   Data Reviewed: I have personally reviewed following labs and imaging studies  CBC: Recent Labs  Lab 08/28/19 2044 08/28/19 2044 08/29/19 1857 08/31/19 0443 09/01/19 0653 09/01/19 1754 09/02/19 0556  WBC 16.7*   < > 27.6* 10.3 21.3* 37.6* 23.5*  NEUTROABS 15.0*  --  23.6*  --  16.4*  --   --   HGB 14.3   < > 14.6 9.2* 13.4 13.4 11.4*  HCT 41.2   < > 42.7 29.5* 39.2 39.9 33.4*  MCV 84.4   < > 88.0 91.6 86.7 87.7 87.9  PLT 446*   < > 420* 175 389 341 290   < > = values in this interval not displayed.   Basic Metabolic Panel: Recent Labs  Lab 08/28/19 2044 08/28/19 2044 08/30/19 0614 08/31/19 0730 09/01/19 0653 09/01/19 1754 09/02/19 0556  NA 133*  --  136 136 137  --  136  K 3.8  --  4.0 3.5 3.8  --  3.9  CL  101  --  102 103 103  --  103  CO2 24  --  '24 25 26  '$ --  25  GLUCOSE 130*  --  123* 92 101*  --  141*  BUN 21*  --  '12 11 12  '$ --  13  CREATININE 0.61   < > 0.53 0.40* 0.38* 0.71 0.47  CALCIUM 8.8*  --  8.6* 8.3* 8.6*  --  8.0*  MG  --   --   --  2.0  --   --   --    < > = values in this interval not displayed.   GFR: Estimated Creatinine Clearance: 78.2 mL/min (by C-G formula based on SCr of 0.47 mg/dL). Liver Function Tests: Recent Labs  Lab 08/28/19 2044 08/30/19 0614  AST 46* 21  ALT 68* 39  ALKPHOS 99 100  BILITOT 0.6 0.5  PROT 7.1 6.1*  ALBUMIN 3.4* 3.0*    No results for input(s): LIPASE, AMYLASE in the last 168 hours. No results for input(s): AMMONIA in the last 168 hours. Coagulation Profile: No results for input(s): INR, PROTIME in the last 168 hours. Cardiac Enzymes: No results for input(s): CKTOTAL, CKMB, CKMBINDEX, TROPONINI in the last 168 hours. BNP (last 3 results) No results for input(s): PROBNP in the last 8760 hours. HbA1C: No results for input(s): HGBA1C in the last 72 hours. CBG: Recent Labs  Lab 08/27/19 1224  GLUCAP 78   Lipid Profile: No results for input(s): CHOL, HDL, LDLCALC, TRIG, CHOLHDL, LDLDIRECT in the last 72 hours. Thyroid Function Tests: Recent Labs    08/31/19 0730  TSH 0.085*   Anemia Panel: No results for input(s): VITAMINB12, FOLATE, FERRITIN, TIBC, IRON, RETICCTPCT in the last 72 hours. Sepsis Labs: No results for input(s): PROCALCITON, LATICACIDVEN in the last 168 hours.  Recent Results (from the past 240 hour(s))  SARS CORONAVIRUS 2 (TAT 6-24 HRS) Nasopharyngeal Nasopharyngeal Swab     Status: None   Collection Time: 08/26/19  1:27 PM   Specimen: Nasopharyngeal Swab  Result Value Ref Range Status   SARS Coronavirus 2 NEGATIVE NEGATIVE Final    Comment: (NOTE) SARS-CoV-2 target nucleic acids are NOT DETECTED. The SARS-CoV-2 RNA is generally detectable in upper and lower respiratory specimens during the acute phase of infection. Negative results do not preclude SARS-CoV-2 infection, do not rule out co-infections with other pathogens, and should not be used as the sole basis for treatment or other patient management decisions. Negative results must be combined with clinical observations, patient history, and epidemiological information. The expected result is Negative. Fact Sheet for Patients: SugarRoll.be Fact Sheet for Healthcare Providers: https://www.woods-mathews.com/ This test is not yet approved or cleared by the [place] FDA and   has been authorized for detection and/or diagnosis of SARS-CoV-2 by FDA under an Emergency Use Authorization (EUA). This EUA will remain  in effect (meaning this test can be used) for the duration of the COVID-19 declaration under Section 56 4(b)(1) of the Act, 21 U.S.C. section 360bbb-3(b)(1), unless the authorization is terminated or revoked sooner. Performed at [facility], [place] [address] [place], [place] [address]   SARS Coronavirus 2 by RT PCR (hospital order, performed in [facility] hospital lab) Nasopharyngeal Nasopharyngeal Swab     Status: None   Collection Time: 08/29/19  4:12 AM   Specimen: Nasopharyngeal Swab  Result Value Ref Range Status   SARS Coronavirus 2 NEGATIVE NEGATIVE Final    Comment: (NOTE) SARS-CoV-2 target nucleic acids are NOT DETECTED.  The SARS-CoV-2 RNA is generally detectable in upper and lower respiratory specimens during the acute phase of infection. The lowest concentration of SARS-CoV-2 viral copies this assay can detect is 250 copies / mL. A negative result does not preclude SARS-CoV-2 infection and should not be used as the sole basis for treatment or other patient management decisions.  A negative result may occur with improper specimen collection / handling, submission of specimen other than nasopharyngeal swab, presence of viral mutation(s) within the areas targeted by this assay, and inadequate number of viral copies (<250 copies / mL). A negative result must be combined with clinical observations, patient history, and epidemiological information.  Fact Sheet for Patients:   StrictlyIdeas.no  Fact Sheet for Healthcare Providers: BankingDealers.co.za  This test is not yet approved or  cleared by the [place] FDA and has been authorized for detection and/or diagnosis of SARS-CoV-2 by FDA under an Emergency Use Authorization (EUA).  This EUA will remain in effect (meaning this  test can be used) for the duration of the COVID-19 declaration under Section 564(b)(1) of the Act, 21 U.S.C. section 360bbb-3(b)(1), unless the authorization is terminated or revoked sooner.  Performed at [facility], [address], [place], [place] [address]       Radiology Studies: DG Sacrum/Coccyx  Result Date: 09/01/2019 CLINICAL DATA:  Metastatic disease to the sacrum and pelvic bones. EXAM: DG C-ARM 1-60 MIN; SACRUM AND COCCYX - 2+ VIEW FLUOROSCOPY TIME:  Fluoroscopy Time:  3 minutes 45 seconds COMPARISON:  PET-CT dated 08/27/2019 FINDINGS: AP and lateral C-arm images demonstrate contrast in the right sacral ala and adjacent right iliac bone which correlates with metastatic lesions visible on PET-CT. IMPRESSION: Cement augmentation of the right ilium and right sacral ala after radiofrequency ablation of metastatic disease. C-arm fluoroscopic images were obtained intraoperatively and submitted for post operative interpretation. Electronically Signed   By: Lorriane Shire M.D.   On: 09/01/2019 16:02   DG C-Arm 1-60 Min  Result Date: 09/01/2019 CLINICAL DATA:  Intramedullary nail insertion right femur secondary to metastatic disease and pinning fracture. EXAM: DG C-ARM 1-60 MIN; RIGHT FEMUR 2 VIEWS FLUOROSCOPY TIME:  Fluoroscopy Time:  3 minutes 45 seconds COMPARISON:  CT dated 08/27/2019 FINDINGS: C-arm images demonstrate insertion of intramedullary nail and proximal and distal screws in the right femur. The hardware appears in good position. Fracture. IMPRESSION: Satisfactory appearance of the right femur after intramedullary nail insertion. C-arm fluoroscopic images were obtained intraoperatively and submitted for post operative interpretation. Electronically Signed   By: Lorriane Shire M.D.   On: 09/01/2019 16:05   DG C-Arm 1-60 Min  Result Date: 09/01/2019 CLINICAL DATA:  Metastatic disease to the sacrum and pelvic bones. EXAM: DG C-ARM 1-60 MIN; SACRUM AND COCCYX - 2+ VIEW  FLUOROSCOPY TIME:  Fluoroscopy Time:  3 minutes 45 seconds COMPARISON:  PET-CT dated 08/27/2019 FINDINGS: AP and lateral C-arm images demonstrate contrast in the right sacral ala and adjacent right iliac bone which correlates with metastatic lesions visible on PET-CT. IMPRESSION: Cement augmentation of the right ilium and right sacral ala after radiofrequency ablation of metastatic disease. C-arm fluoroscopic images were obtained intraoperatively and submitted for post operative interpretation. Electronically Signed   By: Lorriane Shire M.D.   On: 09/01/2019 16:02   DG FEMUR, MIN 2 VIEWS RIGHT  Result Date: 09/01/2019 CLINICAL DATA:  Intramedullary nail insertion right femur secondary to metastatic disease and pinning fracture. EXAM: DG C-ARM 1-60 MIN; RIGHT FEMUR 2 VIEWS FLUOROSCOPY TIME:  Fluoroscopy Time:  3 minutes 45 seconds COMPARISON:  CT dated 08/27/2019 FINDINGS: C-arm images demonstrate insertion of intramedullary nail and proximal and distal screws in the right femur. The hardware appears in good position. Fracture. IMPRESSION: Satisfactory appearance of the right femur after intramedullary nail insertion. C-arm fluoroscopic images were obtained intraoperatively and submitted for post operative interpretation. Electronically Signed   By: Lorriane Shire M.D.   On: 09/01/2019 16:05        Scheduled Meds: . Chlorhexidine Gluconate Cloth  6 each Topical Daily  . dexamethasone (DECADRON) injection  8 mg Intravenous Q12H  . docusate sodium  100 mg Oral BID  . enoxaparin (LOVENOX) injection  40 mg Subcutaneous Q24H  . gabapentin  300 mg Oral BID  . polyethylene glycol  17 g Oral Daily  . senna-docusate  1 tablet Oral BID   Continuous Infusions: . lactated ringers 800 mL/hr at 09/01/19 1443     Assessment/Plan:  Back pain/pathological vertebral compression fractures:  Underwent radiofrequency ablation of tumor with kyphoplasty at fractured levels of T6, L1 and L4. Recieveing supportive  management with pain meds, decadron (8 mg twice daily for 10 days from 6/4 to 6/14-discontinued today, analgesics (IV morphine/oxycodone) and anxiolytics.  DC IV fluids as eating well.  Patient encouraged to utilize oral pain medications to determine home regimen.  Right sacrum and ilium tumor:Seen by Orthopedics and went to OR 6/14 for radiofrequency ablation and cement augmentation of sacrum/ iliac crest lesions.  Seen by PT and was able to walk with Rollator walker.  Right proximal femur lesion : Underwent surgery on the right hip with prophylactic rodding with radiofrequency ablation to the tumor prior to placement of metal rod and screws  Metastatic breast cancer:ER/PR negative HER-2 positive.  Appreciate oncology input and follow-up.  They plan to see in their clinic on Thursday -Dr. Janese Banks will adjust pain medications for symptom management  Left acetabular lesion , does not appear symptomatic  Leukocytosis, steroid-induced.  DC steroids today  Transaminitis : Unclear etiology. now resolved, hepatitis screen negative. Liver okay on PET scan      DVT prophylaxis: lovenox Code Status: Full code Family / Patient Communication: Patient is a single mother with 3 children no immediate family in the [place], friend updated at bedside with permission on 6/14 Disposition Plan:   Status is: Inpatient  Remains inpatient appropriate because:Ongoing active pain requiring inpatient pain management   Dispo: The patient is from: Home  Anticipated d/c is to: Home  with home PT/DME               Anticipated d/c date is: 1 days if does not require IV pain medications.  Patient currently is not medically stable to d/c.   LOS: 4 days    Time spent: 25 minutes    Guilford Shi, MD [facility] Pager in [place]  If 7PM-7AM, please contact night-coverage [email or web address] 09/02/2019, 5:22 PM

## 2019-09-02 NOTE — Evaluation (Signed)
Occupational Therapy Evaluation Patient Details Name: Sophia [name] MRN: [id number] DOB: 08/20/1980 Today's Date: 09/02/2019    History of Present Illness 39 yo female with onset of severe back pain was admitted, found multiple pathological spinal fractures from mets after recent dx with primary breast CA.  Had kyphoplasty on 08/29/19, for T6, L1 and L4.  Pain now relieved by procedure, but residual pain on R hip from mets.  Resolved leukocytosis, but ongoing issues of weakness and discomfort from mets in R hip and leg.  She is now also s/p R sacroplasty and R hip ORIF IM nail 6/14.   Clinical Impression   Pt was seen for OT evaluation this date. Prior to hospital admission, pt was Indep with mobility, ADLs and IADLs. Pt lives with spouse and son in [facility] with level entry. Currently pt demonstrates impairments as described below (See OT problem list) which functionally limit her ability to perform ADL/self-care tasks. Pt currently requires setup for UB ADLs, MOD A for LB ADLs and cannot tolerate transfers at time of OT assessment 2/2 pain (requested pain meds from RN who presented with them during session).  Pt would benefit from skilled OT to address noted impairments and functional limitations (see below for any additional details) in order to maximize safety and independence while minimizing falls risk and caregiver burden. Upon hospital discharge, recommend HHOT to maximize pt safety and return to functional independence during meaningful occupations of daily life.     Follow Up Recommendations  Home health OT    Equipment Recommendations  3 in 1 bedside commode;Tub/shower seat    Recommendations for Other Services       Precautions / Restrictions Precautions Precautions: Fall;Back Restrictions Weight Bearing Restrictions: Yes RLE Weight Bearing: Weight bearing as tolerated      Mobility Bed Mobility               General bed mobility comments: cannot tolerate at this  time d/t pain. in recliner pre/post session  Transfers  Sit to Stand: Min assist;Min guard per PT eval         General transfer comment: NT on OT assessment d/t pain    Balance  Sitting balance - Comments: NT   Standing balance comment: NT                           ADL either performed or assessed with clinical judgement   ADL                                         General ADL Comments: able to perform UB ADLs with setup including washign hands (cannot walk to obtain items herself at this time d/t increase R LE pain). Requires MOD/MAX for seated LB ADLs.     Vision Patient Visual Report: No change from baseline       Perception     Praxis      Pertinent Vitals/Pain Pain Assessment: 0-10 Pain Score: 9  Pain Location: reports severe pain to R thigh, up to 9/10 with attempts to mobilize, states she has asked for pain medication Pain Descriptors / Indicators: Aching;Grimacing;Guarding;Tender Pain Intervention(s): Limited activity within patient's tolerance;Monitored during session;Patient requesting pain meds-RN notified;RN gave pain meds during session     Hand Dominance Right   Extremity/Trunk Assessment Upper Extremity Assessment Upper Extremity Assessment: Generalized weakness  Lower Extremity Assessment Lower Extremity Assessment: Defer to PT evaluation;RLE deficits/detail RLE Deficits / Details: unable to tolerate lowering R LE for assessment (seated up in chair with feet elevated), unable to complete Indep SLR LLE Deficits / Details: [facility]       Communication Communication Communication: Prefers language other than English (speaks english well, asked if she would like interpreter, and pt declines.)   Cognition Arousal/Alertness: Awake/alert Behavior During Therapy: WFL for tasks assessed/performed Overall Cognitive Status: Within Functional Limits for tasks assessed                                      General Comments       Exercises Other Exercises Other Exercises: OT educates re: role of OT in acute setting as well as considerations for home. Pt with good reception   Shoulder Instructions      Home Living Family/patient expects to be discharged to:: Private residence Living Arrangements: Spouse/significant other Available Help at Discharge: Family;Friend(s);Available 24 hours/day Type of Home: House Home Access: Level entry     Home Layout: One level         Bathroom Toilet: Standard     Home Equipment: None   Additional Comments: fully independent and drove before pain started      Prior Functioning/Environment Level of Independence: Independent                 OT Problem List: Decreased strength;Decreased range of motion;Decreased activity tolerance;Impaired balance (sitting and/or standing);Pain      OT Treatment/Interventions: Self-care/ADL training;Therapeutic exercise;Energy conservation;DME and/or AE instruction;Therapeutic activities    OT Goals(Current goals can be found in the care plan section) Acute Rehab OT Goals Patient Stated Goal: to get back to walking well, go home OT Goal Formulation: With patient Time For Goal Achievement: 09/16/19 Potential to Achieve Goals: Good  OT Frequency: Min 2X/week   Barriers to D/C:            Co-evaluation              AM-PAC OT "6 Clicks" Daily Activity     Outcome Measure Help from another person eating meals?: None Help from another person taking care of personal grooming?: A Little Help from another person toileting, which includes using toliet, bedpan, or urinal?: A Lot Help from another person bathing (including washing, rinsing, drying)?: A Lot Help from another person to put on and taking off regular upper body clothing?: A Little Help from another person to put on and taking off regular lower body clothing?: A Lot 6 Click Score: 16   End of Session Nurse Communication: Mobility  status;Patient requests pain meds  Activity Tolerance: Patient limited by pain Patient left: in chair;with call bell/phone within reach  OT Visit Diagnosis: Muscle weakness (generalized) (M62.81);Pain Pain - Right/Left: Right Pain - part of body: Leg                Time: 5697-9480 OT Time Calculation (min): 23 min Charges:  OT General Charges $OT Visit: 1 Visit OT Evaluation $OT Eval Moderate Complexity: 1 Mod OT Treatments $Self Care/Home Management : 8-22 mins  Gerrianne Scale, MS, OTR/L ascom [phone] 09/02/19, 5:05 PM

## 2019-09-03 ENCOUNTER — Inpatient Hospital Stay: Payer: Self-pay

## 2019-09-03 ENCOUNTER — Other Ambulatory Visit: Payer: Self-pay

## 2019-09-03 MED ORDER — OXYCODONE HCL ER 10 MG PO T12A
10.0000 mg | EXTENDED_RELEASE_TABLET | Freq: Two times a day (BID) | ORAL | Status: DC
  Administered 2019-09-03 (×2): 10 mg via ORAL
  Filled 2019-09-03 (×2): qty 1

## 2019-09-03 NOTE — Progress Notes (Signed)
   Subjective: 2 Days Post-Op Procedure(s) (LRB): Right Sacral Radiofrequency Ablation and Cement Augmentation, Right sacrum and iliac crest (Right) INTRAMEDULLARY (IM) NAIL INTERTROCHANTRIC AND RADIOFREQUENCY ABLATION (Right) Patient reports pain as mild. improving Patient is well, and has had no acute complaints or problems Denies any CP, SOB, ABD pain. We will continue therapy today.   Objective: Vital signs in last 24 hours: Temp:  [97.9 F (36.6 C)-99 F (37.2 C)] 98.5 F (36.9 C) (06/16 0309) Pulse Rate:  [75-98] 78 (06/16 0309) Resp:  [14-20] 16 (06/16 0309) BP: (110-141)/(69-99) 110/69 (06/16 0309) SpO2:  [97 %-98 %] 97 % (06/16 0309)  Intake/Output from previous day: 06/15 0701 - 06/16 0700 In: 1440 [P.O.:1440] Out: -  Intake/Output this shift: No intake/output data recorded.  Recent Labs    09/01/19 0653 09/01/19 1754 09/02/19 0556  HGB 13.4 13.4 11.4*   Recent Labs    09/01/19 1754 09/02/19 0556  WBC 37.6* 23.5*  RBC 4.55 3.80*  HCT 39.9 33.4*  PLT 341 290   Recent Labs    09/01/19 0653 09/01/19 0653 09/01/19 1754 09/02/19 0556  NA 137  --   --  136  K 3.8  --   --  3.9  CL 103  --   --  103  CO2 26  --   --  25  BUN 12  --   --  13  CREATININE 0.38*   < > 0.71 0.47  GLUCOSE 101*  --   --  141*  CALCIUM 8.6*  --   --  8.0*   < > = values in this interval not displayed.   No results for input(s): LABPT, INR in the last 72 hours.  EXAM General - Patient is Alert, Appropriate and Oriented Extremity - Neurovascular intact Sensation intact distally Intact pulses distally Dorsiflexion/Plantar flexion intact No cellulitis present Compartment soft Dressing - dressing C/D/I and no drainage Motor Function - intact, moving foot and toes well on exam.   Past Medical History:  Diagnosis Date  . Anxiety   . Cancer ([place])   . Vertigo     Assessment/Plan:   2 Days Post-Op Procedure(s) (LRB): Right Sacral Radiofrequency Ablation and Cement  Augmentation, Right sacrum and iliac crest (Right) INTRAMEDULLARY (IM) NAIL INTERTROCHANTRIC AND RADIOFREQUENCY ABLATION (Right) Principal Problem:   Multiple pathological fractures Active Problems:   Primary cancer of left breast with metastasis to other site [facility])   Anxiety   Preoperative clearance   Leukocytosis   Metastatic breast cancer (HCC)   Goals of care, counseling/discussion   Neoplasm related pain  Estimated body mass index is 25.32 kg/m as calculated from the following:   Height as of this encounter: 5\' 1"  (1.549 m).   Weight as of this encounter: 60.8 kg. Advance diet Up with therapy  VSS Pain well controlled   Patient will need follow up with [place] in 2 weeks Keep incision site clean and dry Lovenox 40 mg sq daily x 14 days at discharge TED hose BLE x 6 weeks   DVT Prophylaxis - Lovenox, Foot Pumps and TED hose Weight-Bearing as tolerated to right leg   T. Rachelle Hora, PA-C [place] 09/03/2019, 8:22 AM

## 2019-09-03 NOTE — Progress Notes (Addendum)
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  Patient also had port placement by Dr.Dew earlier that day.  ED Course: Afebrile, WBC 16K Hospital course: Patient admitted to [facility] for further evaluation and management. Kyphoplasty with ablation scheduled after EDP discussed with oncologist, Dr. Janese Banks, neurosurgeon Dr. Cari Caraway and orthopedist Dr. Rudene Christians as patient not felt to be a good candidate for instrumented fusion.   Patient also planned for prophylactic fixation of the right hip as well as radiofrequency ablation of the sacral metastatic region as inpatient.   Subjective:  Patient reports better pain control but has felt lightheaded since she she received IV morphine through Port today Objective: Vitals:   09/02/19 2331 09/03/19 0309 09/03/19 0838 09/03/19 1136  BP: 112/79 110/69 111/69 105/61  Pulse: 75 78 81 89  Resp: _0 Temp: 98.2 F (36.8 C) 98.5 F (36.9 C) 98.6 F (37 C) 98.9 F (37.2 C)  TempSrc: Oral Oral Oral Oral  SpO2: 97% 97% 99% 97%  Weight:      Height:        Intake/Output Summary (Last 24 hours) at 09/03/2019 1351 Last data filed at 09/02/2019 1915 Gross per 24 hour  Intake 1200 ml  Output --  Net 1200 ml   Filed Weights   08/28/19 1938  Weight: 60.8 kg    Physical Examination:  General exam: Appears calm and no acute distress.   Respiratory system: Clear to auscultation. Respiratory effort normal. Cardiovascular system: S1 & S2 heard, RRR. No  JVD, murmurs, rubs, gallops or clicks. No pedal edema. Gastrointestinal system: Abdomen is nondistended, soft and nontender. Normal bowel sounds heard. Central nervous system: Alert and oriented. No new focal neurological deficits. Extremities: Postop dressing along right hip/thigh.  Sutures along Port-A-Cath entry site/neck Skin: Healing surgical scar from port placement along right subclavicular area. Psychiatry: Judgement and insight appear normal. Mood & affect appropriate.   Data Reviewed: I have personally reviewed following labs and imaging studies  CBC: Recent Labs  Lab 08/28/19 2044 08/28/19 2044 08/29/19 1857 08/31/19 0443 09/01/19 0653 09/01/19 1754 09/02/19 0556  WBC 16.7*   < > 27.6* 10.3 21.3* 37.6* 23.5*  NEUTROABS 15.0*  --  23.6*  --  16.4*  --   --   HGB 14.3   < > 14.6 9.2* 13.4 13.4 11.4*  HCT 41.2   < > 42.7 29.5* 39.2 39.9 33.4*  MCV 84.4   < > 88.0 91.6 86.7 87.7 87.9  PLT 446*   < > 420* 175 389 341 290   < > = values in this interval not displayed.   Basic Metabolic Panel: Recent Labs  Lab 08/28/19 2044 08/28/19 2044 08/30/19 0614 08/31/19 0730 09/01/19 0653 09/01/19 1754 09/02/19 0556  NA 133*  --  136 136 137  --  136  K 3.8  --  4.0 3.5 3.8  --  3.9  CL 101  --  102 103 103  --  103  CO2 24  --  _1 --  25  GLUCOSE 130*  --  123* 92 101*  --  141*  BUN 21*  --  _0 --  13  CREATININE 0.61   < > 0.53 0.40* 0.38* 0.71 0.47  CALCIUM 8.8*  --  8.6* 8.3* 8.6*  --  8.0*  MG  --   --   --  2.0  --   --   --    < > = values in this interval not displayed.   GFR: Estimated Creatinine Clearance: 78.2 mL/min (by C-G formula based on SCr of 0.47 mg/dL). Liver Function Tests: Recent Labs  Lab 08/28/19 2044 08/30/19 0614  AST 46* 21  ALT 68* 39  ALKPHOS 99 100  BILITOT 0.6 0.5  PROT 7.1 6.1*  ALBUMIN 3.4* 3.0*   No results for input(s): LIPASE, AMYLASE in the last 168 hours. No results for input(s): AMMONIA in the last 168  hours. Coagulation Profile: No results for input(s): INR, PROTIME in the last 168 hours. Cardiac Enzymes: No results for input(s): CKTOTAL, CKMB, CKMBINDEX, TROPONINI in the last 168 hours. BNP (last 3 results) No results for input(s): PROBNP in the last 8760 hours. HbA1C: No results for input(s): HGBA1C in the last 72 hours. CBG: No results for input(s): GLUCAP in the last 168 hours. Lipid Profile: No results for input(s): CHOL, HDL, LDLCALC, TRIG, CHOLHDL, LDLDIRECT in the last 72 hours. Thyroid Function Tests: No results for input(s): TSH, T4TOTAL, FREET4, T3FREE, THYROIDAB in the last 72 hours. Anemia Panel: No results for input(s): VITAMINB12, FOLATE, FERRITIN, TIBC, IRON, RETICCTPCT in the last 72 hours. Sepsis Labs: No results for input(s): PROCALCITON, LATICACIDVEN in the last 168 hours.  Recent Results (from the past 240 hour(s))  SARS CORONAVIRUS 2 (TAT 6-24 HRS) Nasopharyngeal Nasopharyngeal Swab     Status: None   Collection Time: 08/26/19  1:27 PM   Specimen: Nasopharyngeal Swab  Result Value Ref Range Status   SARS Coronavirus 2 NEGATIVE NEGATIVE Final    Comment: (NOTE) SARS-CoV-2 target nucleic acids are NOT DETECTED. The SARS-CoV-2 RNA is generally detectable in upper and lower respiratory specimens during the acute phase of infection. Negative results do not preclude SARS-CoV-2 infection, do not rule out co-infections with other pathogens, and should not be used as the sole basis for treatment or other patient management decisions. Negative results must be combined with clinical observations, patient history, and epidemiological information. The expected result is Negative. Fact Sheet for Patients: SugarRoll.be Fact Sheet for Healthcare Providers: https://www.woods-mathews.com/ This test is not yet approved or cleared by the [place] FDA and  has been authorized for detection and/or diagnosis of SARS-CoV-2 by FDA  under an Emergency Use Authorization (EUA). This EUA will remain  in effect (meaning this test can be used) for the duration of the COVID-19 declaration under Section 56 4(b)(1) of the Act, 21 U.S.C. section 360bbb-3(b)(1), unless the authorization is terminated or revoked sooner. Performed at [facility], [place] [address] [place],  [address]   SARS Coronavirus 2 by RT PCR (hospital order, performed in [facility] hospital lab) Nasopharyngeal Nasopharyngeal Swab     Status: None   Collection Time: 08/29/19  4:12 AM   Specimen: Nasopharyngeal Swab  Result Value Ref Range Status   SARS Coronavirus 2 NEGATIVE NEGATIVE Final    Comment: (NOTE) SARS-CoV-2 target nucleic acids are NOT DETECTED.  The SARS-CoV-2 RNA is generally detectable in upper and lower respiratory specimens during the acute phase of infection. The lowest concentration of SARS-CoV-2  viral copies this assay can detect is 250 copies / mL. A negative result does not preclude SARS-CoV-2 infection and should not be used as the sole basis for treatment or other patient management decisions.  A negative result may occur with improper specimen collection / handling, submission of specimen other than nasopharyngeal swab, presence of viral mutation(s) within the areas targeted by this assay, and inadequate number of viral copies (<250 copies / mL). A negative result must be combined with clinical observations, patient history, and epidemiological information.  Fact Sheet for Patients:   StrictlyIdeas.no  Fact Sheet for Healthcare Providers: BankingDealers.co.za  This test is not yet approved or  cleared by the [place] FDA and has been authorized for detection and/or diagnosis of SARS-CoV-2 by FDA under an Emergency Use Authorization (EUA).  This EUA will remain in effect (meaning this test can be used) for the duration of the COVID-19 declaration under Section  564(b)(1) of the Act, 21 U.S.C. section 360bbb-3(b)(1), unless the authorization is terminated or revoked sooner.  Performed at [facility], [address] [place], [place] [address]       Radiology Studies: DG Sacrum/Coccyx  Result Date: 09/01/2019 CLINICAL DATA:  Metastatic disease to the sacrum and pelvic bones. EXAM: DG C-ARM 1-60 MIN; SACRUM AND COCCYX - 2+ VIEW FLUOROSCOPY TIME:  Fluoroscopy Time:  3 minutes 45 seconds COMPARISON:  PET-CT dated 08/27/2019 FINDINGS: AP and lateral C-arm images demonstrate contrast in the right sacral ala and adjacent right iliac bone which correlates with metastatic lesions visible on PET-CT. IMPRESSION: Cement augmentation of the right ilium and right sacral ala after radiofrequency ablation of metastatic disease. C-arm fluoroscopic images were obtained intraoperatively and submitted for post operative interpretation. Electronically Signed   By: Lorriane Shire M.D.   On: 09/01/2019 16:02   DG C-Arm 1-60 Min  Result Date: 09/01/2019 CLINICAL DATA:  Intramedullary nail insertion right femur secondary to metastatic disease and pinning fracture. EXAM: DG C-ARM 1-60 MIN; RIGHT FEMUR 2 VIEWS FLUOROSCOPY TIME:  Fluoroscopy Time:  3 minutes 45 seconds COMPARISON:  CT dated 08/27/2019 FINDINGS: C-arm images demonstrate insertion of intramedullary nail and proximal and distal screws in the right femur. The hardware appears in good position. Fracture. IMPRESSION: Satisfactory appearance of the right femur after intramedullary nail insertion. C-arm fluoroscopic images were obtained intraoperatively and submitted for post operative interpretation. Electronically Signed   By: Lorriane Shire M.D.   On: 09/01/2019 16:05   DG C-Arm 1-60 Min  Result Date: 09/01/2019 CLINICAL DATA:  Metastatic disease to the sacrum and pelvic bones. EXAM: DG C-ARM 1-60 MIN; SACRUM AND COCCYX - 2+ VIEW FLUOROSCOPY TIME:  Fluoroscopy Time:  3 minutes 45 seconds COMPARISON:  PET-CT  dated 08/27/2019 FINDINGS: AP and lateral C-arm images demonstrate contrast in the right sacral ala and adjacent right iliac bone which correlates with metastatic lesions visible on PET-CT. IMPRESSION: Cement augmentation of the right ilium and right sacral ala after radiofrequency ablation of metastatic disease. C-arm fluoroscopic images were obtained intraoperatively and submitted for post operative interpretation. Electronically Signed   By: Lorriane Shire M.D.   On: 09/01/2019 16:02   DG FEMUR, MIN 2 VIEWS RIGHT  Result Date: 09/01/2019 CLINICAL DATA:  Intramedullary nail insertion right femur secondary to metastatic disease and pinning fracture. EXAM: DG C-ARM 1-60 MIN; RIGHT FEMUR 2 VIEWS FLUOROSCOPY TIME:  Fluoroscopy Time:  3 minutes 45 seconds COMPARISON:  CT dated 08/27/2019 FINDINGS: C-arm images demonstrate insertion of intramedullary nail and proximal and distal screws  in the right femur. The hardware appears in good position. Fracture. IMPRESSION: Satisfactory appearance of the right femur after intramedullary nail insertion. C-arm fluoroscopic images were obtained intraoperatively and submitted for post operative interpretation. Electronically Signed   By: Lorriane Shire M.D.   On: 09/01/2019 16:05        Scheduled Meds: . Chlorhexidine Gluconate Cloth  6 each Topical Daily  . docusate sodium  100 mg Oral BID  . enoxaparin (LOVENOX) injection  40 mg Subcutaneous Q24H  . fentaNYL  1 patch Transdermal Q72H  . gabapentin  300 mg Oral BID  . senna-docusate  1 tablet Oral BID   Continuous Infusions:    Assessment/Plan:  Back pain/pathological vertebral compression fractures:  Underwent radiofrequency ablation of tumor with kyphoplasty at fractured levels of T6, L1 and L4. Recieveing supportive management with pain meds, decadron (8 mg twice daily for 10 days from 6/4 to 6/14-discontinued today, analgesics (IV morphine/oxycodone) and anxiolytics.  Off IV fluids as eating well.   Patient encouraged to utilize oral pain medications to determine home regimen. Low dose fentanyl patch ordered yesterday --not sure if causing lightheadedness. D/W nurse who feels patients symptoms started after IV morphine. Will minimize morphine use today, check orthostatics. Will d/c fentanyl patch and add long acting Oxycontin for better pain control. Also on oxycodone PRN.  Lovenox for DVT prophylaxis recommended by ortho for atleast 2 weeks--requested nurse to educate/train patient regarding self administration.  Right sacrum and ilium tumor:Seen by Orthopedics and went to OR 6/14 for radiofrequency ablation and cement augmentation of sacrum/ iliac crest lesions.  Seen by PT and was able to walk with Rollator walker.  Right proximal femur lesion : Underwent surgery on the right hip with prophylactic rodding with radiofrequency ablation to the tumor prior to placement of metal rod and screws  Metastatic breast cancer:ER/PR negative HER-2 positive.  Appreciate oncology input and follow-up.  They plan to see patient tomorrow and give chemotherapy before discharge-Dr. Janese Banks will adjust pain medications for symptom management upon discharge  Left acetabular lesion , does not appear symptomatic  Leukocytosis, steroid-induced.  DCed steroids 6/15 per Oncology  Transaminitis : Unclear etiology. now resolved, hepatitis screen negative. Liver okay on PET scan      DVT prophylaxis: lovenox Code Status: Full code Family / Patient Communication: Patient is a single mother with 3 children no immediate family in the [place], friend updated at bedside with permission on 6/14.  Disposition Plan:   Status is: Inpatient  Remains inpatient appropriate because:Ongoing active pain requiring inpatient pain management   Dispo: The patient is from: Home  Anticipated d/c is to: Home  with home PT/DME-youth rollator/3 in1 ordered               Anticipated d/c date is: 1 days if  does not require IV pain medications.  Patient currently is not medically stable to d/c.   LOS: 5 days    Time spent: 25 minutes    Guilford Shi, MD [facility] Pager in [place]  If 7PM-7AM, please contact night-coverage [email or web address] 09/03/2019, 1:51 PM

## 2019-09-03 NOTE — Progress Notes (Signed)
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  Had kyphoplasty on 08/29/19, for T6, L1 and L4.  Pain now relieved by procedure, but residual pain on R hip from mets.  Resolved leukocytosis, but ongoing issues of weakness and discomfort from mets in R hip and leg.  She is now also s/p R sacroplasty and R hip ORIF IM nail 6/14.    PT Comments    Pt noted dizziness upon arrival for treatment today.  Orthostatic BP was taken with supine BP 107/73 and sitting 121/82. Pt motivated for therapy this session but ultimately was limited by increased pain and nausea.   Log roll training was implemented for today's session for back precaution compliance and pt educated on importance to adhere to precautions for safety reasons.  Pt did have difficulty with hand placement initially, however was able to navigate through with proper cuing.  Upon standing, pt communicated that she would like to walk despite above symptoms but again was limited with a max amb distance of 15 feet.  Nursing notified of pt's nausea.  Pt was given cold washcloth to apply to forehead upon leaving room.  Pt will benefit from HHPT services upon discharge to safely address deficits listed in patient problem list for decreased caregiver assistance and eventual return to PLOF.     Follow Up Recommendations  Home health PT     Equipment Recommendations  Rolling walker with 5" wheels;3in1 (PT)    Recommendations for Other Services       Precautions / Restrictions Precautions Precautions: Fall;Back Precaution Comments: per chart review, Dr. Rudene Christians, does want pt to abide by back precautions at this time (no BLT) Restrictions Weight Bearing Restrictions: Yes RLE Weight Bearing:  Weight bearing as tolerated    Mobility  Bed Mobility Overal bed mobility: Needs Assistance Bed Mobility: Rolling;Sidelying to Sit;Sit to Sidelying Rolling: Min assist Sidelying to sit: Min assist     Sit to sidelying: Min assist General bed mobility comments: Log roll training provided during session, assist for BLE, and trunk control.  Transfers Overall transfer level: Needs assistance Equipment used: Rolling walker (2 wheeled) Transfers: Sit to/from Stand Sit to Stand: Min assist         General transfer comment: Pt required verbal cuing for back precaution compliance.  Ambulation/Gait Ambulation/Gait assistance: Min guard Gait Distance (Feet): 15 Feet Assistive device: Rolling walker (2 wheeled) Gait Pattern/deviations: Step-to pattern;Antalgic;Decreased step length - right;Decreased step length - left     General Gait Details: Pt required min cuing necessary for turning in room with RW.   Stairs             Wheelchair Mobility    Modified Rankin (Stroke Patients Only)       Balance Overall balance assessment: Needs assistance Sitting-balance support: Feet supported Sitting balance-Leahy Scale: Good     Standing balance support: Bilateral upper extremity supported Standing balance-Leahy Scale: Fair                              Cognition Arousal/Alertness: Awake/alert Behavior During Therapy: WFL for tasks assessed/performed Overall Cognitive Status: Within Functional Limits for tasks assessed  Exercises Other Exercises Other Exercises: Log roll training provided Other Exercises: Training provided with transfers and gait for back precaution compliance Other Exercises: Orthostatic BP taken secondary to mild c/o dizziness at onset of session    General Comments        Pertinent Vitals/Pain Pain Assessment: 0-10 Pain Score: 8  Pain Location: R thigh Pain Descriptors /  Indicators: Aching;Guarding Pain Intervention(s): Limited activity within patient's tolerance;RN gave pain meds during session;Patient requesting pain meds-RN notified;Repositioned    Home Living                      Prior Function            PT Goals (current goals can now be found in the care plan section) Progress towards PT goals: Progressing toward goals    Frequency    7X/week      PT Plan Current plan remains appropriate    Co-evaluation              AM-PAC PT "6 Clicks" Mobility   Outcome Measure  Help needed turning from your back to your side while in a flat bed without using bedrails?: A Little Help needed moving from lying on your back to sitting on the side of a flat bed without using bedrails?: A Little Help needed moving to and from a bed to a chair (including a wheelchair)?: A Little Help needed standing up from a chair using your arms (e.g., wheelchair or bedside chair)?: A Little Help needed to walk in hospital room?: A Little Help needed climbing 3-5 steps with a railing? : A Lot 6 Click Score: 17    End of Session Equipment Utilized During Treatment: Gait belt Activity Tolerance: Patient limited by fatigue;Patient limited by pain Patient left: with call bell/phone within reach;in bed;with bed alarm set;with SCD's reapplied Nurse Communication: Mobility status PT Visit Diagnosis: Unsteadiness on feet (R26.81);Pain;Muscle weakness (generalized) (M62.81);Other abnormalities of gait and mobility (R26.89);Difficulty in walking, not elsewhere classified (R26.2) Pain - Right/Left: Right Pain - part of body: Hip;Leg     Time: 0630-1601 PT Time Calculation (min) (ACUTE ONLY): 38 min  Charges:  $Therapeutic Activity: 23-37 mins                     D. Scott Pennelope Basque PT, DPT 09/03/19, 5:45 PM

## 2019-09-03 NOTE — Progress Notes (Signed)
Occupational Therapy Treatment Patient Details Name: Sophia [name] MRN: [id number] DOB: 08-Mar-1981 Today's Date: 09/03/2019    History of present illness 39 yo female with onset of severe back pain was admitted, found multiple pathological spinal fractures from mets after recent dx with primary breast CA.  Had kyphoplasty on 08/29/19, for T6, L1 and L4.  Pain now relieved by procedure, but residual pain on R hip from mets.  Resolved leukocytosis, but ongoing issues of weakness and discomfort from mets in R hip and leg.  She is now also s/p R sacroplasty and R hip ORIF IM nail 6/14.   OT comments  Pt seen for OT tx this date to f/u re: safety with self care ADLs and fxl mobility. Pt with improved fxl activity tolerance this date (recived pain medication just prior to session per RN). Pt performs fxl mobility ~49ft on unit to simulate fxl home distances (to/from kitchen and to/from restroom). Pt requires CGA with RW and 2 standing rest breaks during fxl mobility. During sit to stand, requires CGA and one verbal cue for safe hand placement relative to RW. Pt with good reception. OT facilitates education re: introduces topic of AE for LB ADLs to reduce need for assist at this time. Pt with good reception and will require f/u with demo and use of equipment to optimize carryover. Anticipate that HHOT is still most reasonable d/c recommendation at this time.    Follow Up Recommendations  Home health OT    Equipment Recommendations  3 in 1 bedside commode;Tub/shower seat    Recommendations for Other Services      Precautions / Restrictions Precautions Precautions: Fall;Back Precaution Comments: per verbal discussion with Dr. Rudene Christians, does want pt to abide by back precautions at this time (no BLT) Restrictions Weight Bearing Restrictions: Yes RLE Weight Bearing: Weight bearing as tolerated       Mobility Bed Mobility               General bed mobility comments: pt up to restroom  before session and to recliner post session  Transfers Overall transfer level: Needs assistance Equipment used: Rolling walker (2 wheeled) Transfers: Sit to/from Stand Sit to Stand: Min assist;Min guard              Balance Overall balance assessment: Needs assistance Sitting-balance support: Feet supported Sitting balance-Leahy Scale: Good     Standing balance support: Bilateral upper extremity supported Standing balance-Leahy Scale: Fair                             ADL either performed or assessed with clinical judgement   ADL                                         General ADL Comments: stands to wash hands sink side with RW with CGA, requires MOD/MAX A with seated LB ADLs d/t adherance to back precautions and pain, requires AE training.     Vision Patient Visual Report: No change from baseline     Perception     Praxis      Cognition Arousal/Alertness: Awake/alert Behavior During Therapy: WFL for tasks assessed/performed Overall Cognitive Status: Within Functional Limits for tasks assessed  Exercises Other Exercises Other Exercises: OT facilitates education re: AE for LB ADLs (topin introduced and equipment discussed with good reception, but needs demo and teach back method for best carryover)   Shoulder Instructions       General Comments      Pertinent Vitals/ Pain       Pain Assessment: 0-10 Pain Score: 4  Pain Location: R thigh Pain Descriptors / Indicators: Aching;Guarding Pain Intervention(s): Limited activity within patient's tolerance;Monitored during session;Premedicated before session  Home Living                                          Prior Functioning/Environment              Frequency  Min 2X/week        Progress Toward Goals  OT Goals(current goals can now be found in the care plan section)  Progress towards OT  goals: Progressing toward goals  Acute Rehab OT Goals Patient Stated Goal: to get back to walking well, go home OT Goal Formulation: With patient Time For Goal Achievement: 09/16/19 Potential to Achieve Goals: Good  Plan Discharge plan remains appropriate    Co-evaluation                 AM-PAC OT "6 Clicks" Daily Activity     Outcome Measure   Help from another person eating meals?: None Help from another person taking care of personal grooming?: A Little Help from another person toileting, which includes using toliet, bedpan, or urinal?: A Little Help from another person bathing (including washing, rinsing, drying)?: A Lot Help from another person to put on and taking off regular upper body clothing?: A Little Help from another person to put on and taking off regular lower body clothing?: A Lot 6 Click Score: 17    End of Session Equipment Utilized During Treatment: Gait belt;Rolling walker  OT Visit Diagnosis: Muscle weakness (generalized) (M62.81);Pain Pain - Right/Left: Right Pain - part of body: Leg   Activity Tolerance Patient tolerated treatment well   Patient Left in chair;with call bell/phone within reach   Nurse Communication Mobility status        Time: 1941-7408 OT Time Calculation (min): 23 min  Charges: OT General Charges $OT Visit: 1 Visit OT Treatments $Self Care/Home Management : 8-22 mins $Therapeutic Activity: 8-22 mins  Gerrianne Scale, [place], OTR/L ascom [phone] 09/03/19, 12:31 PM

## 2019-09-04 ENCOUNTER — Inpatient Hospital Stay: Payer: Self-pay | Admitting: Oncology

## 2019-09-04 ENCOUNTER — Other Ambulatory Visit: Payer: Self-pay | Admitting: Oncology

## 2019-09-04 ENCOUNTER — Other Ambulatory Visit: Payer: Self-pay | Admitting: *Deleted

## 2019-09-04 ENCOUNTER — Inpatient Hospital Stay: Payer: Self-pay

## 2019-09-04 VITALS — BP 120/74 | HR 99 | Resp 18

## 2019-09-04 DIAGNOSIS — C50919 Malignant neoplasm of unspecified site of unspecified female breast: Secondary | ICD-10-CM

## 2019-09-04 DIAGNOSIS — Z5112 Encounter for antineoplastic immunotherapy: Secondary | ICD-10-CM

## 2019-09-04 DIAGNOSIS — M8440XA Pathological fracture, unspecified site, initial encounter for fracture: Secondary | ICD-10-CM

## 2019-09-04 DIAGNOSIS — Z01818 Encounter for other preprocedural examination: Secondary | ICD-10-CM

## 2019-09-04 LAB — GLUCOSE, CAPILLARY: Glucose-Capillary: 83 mg/dL (ref 70–99)

## 2019-09-04 MED ORDER — ALUM & MAG HYDROXIDE-SIMETH 200-200-20 MG/5ML PO SUSP: 30.0000 mL | ORAL | 0 refills | Status: DC | PRN

## 2019-09-04 MED ORDER — DIPHENHYDRAMINE HCL 50 MG/ML IJ SOLN
50.0000 mg | Freq: Once | INTRAMUSCULAR | Status: AC
  Administered 2019-09-04: 50 mg via INTRAVENOUS
  Filled 2019-09-04: qty 1

## 2019-09-04 MED ORDER — SODIUM CHLORIDE 0.9 % IV SOLN
Freq: Once | INTRAVENOUS | Status: AC
  Filled 2019-09-04: qty 250

## 2019-09-04 MED ORDER — ACETAMINOPHEN 325 MG PO TABS
650.0000 mg | ORAL_TABLET | Freq: Once | ORAL | Status: AC
  Administered 2019-09-04: 650 mg via ORAL
  Filled 2019-09-04: qty 2

## 2019-09-04 MED ORDER — ENOXAPARIN SODIUM 40 MG/0.4ML ~~LOC~~ SOLN: 40.0000 mg | SUBCUTANEOUS | 0 refills | Status: DC

## 2019-09-04 MED ORDER — HEPARIN SOD (PORK) LOCK FLUSH 100 UNIT/ML IV SOLN
500.0000 [IU] | Freq: Once | INTRAVENOUS | Status: AC | PRN
  Administered 2019-09-04: 500 [IU]
  Filled 2019-09-04: qty 5

## 2019-09-04 MED ORDER — HYDROXYZINE HCL 10 MG PO TABS
10.0000 mg | ORAL_TABLET | Freq: Three times a day (TID) | ORAL | 0 refills | Status: DC | PRN
Start: 2019-09-04 — End: 2019-12-19

## 2019-09-04 MED ORDER — FAMOTIDINE IN NACL 20-0.9 MG/50ML-% IV SOLN: 20.0000 mg | Freq: Once | INTRAVENOUS | Status: DC

## 2019-09-04 MED ORDER — HEPARIN SOD (PORK) LOCK FLUSH 100 UNIT/ML IV SOLN
INTRAVENOUS | Status: AC
  Filled 2019-09-04: qty 5

## 2019-09-04 MED ORDER — SODIUM CHLORIDE 0.9 % IV SOLN
840.0000 mg | Freq: Once | INTRAVENOUS | Status: AC
  Administered 2019-09-04: 840 mg via INTRAVENOUS
  Filled 2019-09-04: qty 28

## 2019-09-04 MED ORDER — OXYCODONE HCL 5 MG PO TABS: 5.0000 mg | ORAL_TABLET | ORAL | 0 refills | Status: DC | PRN

## 2019-09-04 MED ORDER — ONDANSETRON HCL 4 MG PO TABS: 4.0000 mg | ORAL_TABLET | Freq: Four times a day (QID) | ORAL | 0 refills | Status: DC | PRN

## 2019-09-04 MED ORDER — DOCUSATE SODIUM 100 MG PO CAPS: 100.0000 mg | ORAL_CAPSULE | Freq: Two times a day (BID) | ORAL | 0 refills | Status: DC

## 2019-09-04 MED ORDER — HEPARIN SOD (PORK) LOCK FLUSH 100 UNIT/ML IV SOLN
INTRAVENOUS | Status: AC
  Filled 2019-09-04: qty 10

## 2019-09-04 MED ORDER — SODIUM CHLORIDE 0.9 % IV SOLN: 20.0000 mg | Freq: Once | INTRAVENOUS | Status: DC

## 2019-09-04 MED ORDER — BISACODYL 10 MG RE SUPP: 10.0000 mg | Freq: Every day | RECTAL | 0 refills | Status: DC | PRN

## 2019-09-04 MED ORDER — GABAPENTIN 300 MG PO CAPS: 300.0000 mg | ORAL_CAPSULE | Freq: Two times a day (BID) | ORAL | 1 refills | Status: DC

## 2019-09-04 MED ORDER — FAMOTIDINE IN NACL 20-0.9 MG/50ML-% IV SOLN
20.0000 mg | Freq: Once | INTRAVENOUS | Status: AC | PRN
  Administered 2019-09-04: 20 mg via INTRAVENOUS

## 2019-09-04 MED ORDER — TRASTUZUMAB-DKST CHEMO 150 MG IV SOLR: 8.0000 mg/kg | Freq: Once | INTRAVENOUS | Status: DC

## 2019-09-04 MED ORDER — MEPERIDINE HCL 25 MG/ML IJ SOLN
25.0000 mg | Freq: Once | INTRAMUSCULAR | Status: AC
  Administered 2019-09-04: 25 mg via INTRAVENOUS

## 2019-09-04 MED ORDER — TRASTUZUMAB CHEMO 150 MG IV SOLR
450.0000 mg | Freq: Once | INTRAVENOUS | Status: AC
  Administered 2019-09-04: 450 mg via INTRAVENOUS
  Filled 2019-09-04: qty 21.43

## 2019-09-04 MED ORDER — SODIUM CHLORIDE 0.9 % IV SOLN
Freq: Once | INTRAVENOUS | Status: DC | PRN
  Filled 2019-09-04: qty 250

## 2019-09-04 MED ORDER — METHYLPREDNISOLONE SODIUM SUCC 125 MG IJ SOLR
125.0000 mg | Freq: Once | INTRAMUSCULAR | Status: AC | PRN
  Administered 2019-09-04: 125 mg via INTRAVENOUS

## 2019-09-04 MED ORDER — SENNOSIDES-DOCUSATE SODIUM 8.6-50 MG PO TABS: 1.0000 | ORAL_TABLET | Freq: Two times a day (BID) | ORAL | 1 refills | Status: DC

## 2019-09-04 MED ORDER — DIPHENHYDRAMINE HCL 50 MG/ML IJ SOLN
25.0000 mg | Freq: Once | INTRAMUSCULAR | Status: AC
  Administered 2019-09-04: 25 mg via INTRAVENOUS

## 2019-09-04 NOTE — Discharge Instructions (Signed)
Diet: As you were doing prior to hospitalization   Dressing:  Keep dressing clean and dry at all times  Activity:  Increase activity slowly as tolerated, but follow the weight bearing instructions below.  No lifting or driving for 6 weeks.  Weight Bearing:   Weight bearing as tolerated to right lower extremity  To prevent constipation: you may use a stool softener such as -  Colace (over the counter) 100 mg by mouth twice a day  Drink plenty of fluids (prune juice may be helpful) and high fiber foods Miralax (over the counter) for constipation as needed.    Itching:  If you experience itching with your medications, try taking only a single pain pill, or even half a pain pill at a time.  You may take up to 10 pain pills per day, and you can also use benadryl over the counter for itching or also to help with sleep.   Precautions:  If you experience chest pain or shortness of breath - call 911 immediately for transfer to the hospital emergency department!!  If you develop a fever greater that 101 F, purulent drainage from wound, increased redness or drainage from wound, or calf pain-Call [place]                                              Follow- Up Appointment:  Please call for an appointment to be seen in 2 weeks at [facility]

## 2019-09-04 NOTE — Progress Notes (Signed)
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 

## 2019-09-04 NOTE — Progress Notes (Unsigned)
Per Dr. Janese Banks and Debbora Lacrosse in pharmacy, ok to give Perjta prior to Herceptin today (dose change and need additional med coming from [place]).  1346 Patient is receiving Herceptin and complains of feeling cold, rigors noted, BP and HR elevated (see VS flowsheet).  Herceptin stopped, Rulon Abide, NP notified.  NS at 932ml/hr started  1348  Benadryl 25 mg IV and Solu-Medrol 125 mg IV given.   Flippin, NP at chairside, Demerol 25 mg IV given.  1352 Pepcid 20 mg IV given.  1405 Rigors have resolved, pt reports feeling better, "just sleepy."  1420 Herceptin restarted per Dr. Janese Banks  1500 Herceptin complete, pt tolerated well.  1505 Report called to [facility] on 1C, pt to be transported back to room 110.

## 2019-09-04 NOTE — TOC Transition Note (Signed)
Transition of Care [facility]) - CM/SW Discharge Note   Patient Details  Name: Sophia [name] MRN: [id number] Date of Birth: [birth year]  Transition of [facility]) CM/SW Contact:  Shelbie Hutching, RN Phone Number: 09/04/2019, 3:16 PM   Clinical Narrative:     Discharge order has been placed.  Patient is currently in the Cancer hospital for her 1st chemotherapy treatment.  Plan is for discharge this afternoon.  Patient will have home health services with [facility] for PT and OT, orders are in.  3 in 1 and RW have been delivered to the patient's room.  This RNCM provided patient with information on [employer] and [facility].  Patient's friends will provide her transportation home.    Final next level of care: Home w Home Health Services Barriers to Discharge: Barriers Resolved   Patient Goals and CMS Choice Patient states their goals for this hospitalization and ongoing recovery are:: I plan to go home I am getting better with my walking now I've had surgery      Discharge Placement                       Discharge Plan and Services In-house Referral: Clinical Social Work   Post Acute Care Choice: Home Health, Durable Medical Equipment          DME Arranged: 3-N-1, [place] rolling DME Agency: [employer] Date DME Agency Contacted: 09/04/19 Time DME Agency Contacted: [phone] Representative spoke with at DME Agency: Thedore Mins HH Arranged: PT, OT [place] Agency: [place] Date [place]: 09/04/19 Time [place]: [place] Representative spoke with at [place]: [place] ([place]) Interventions     Readmission Risk Interventions No flowsheet data found.

## 2019-09-04 NOTE — Anesthesia Postprocedure Evaluation (Signed)
Anesthesia Post Note  Patient: Chrishawna Farina  Procedure(s) Performed: KYPHOPLASTY T6, L1,L4 ,  RADIOFREQUENCY ABLATION (N/A )  Patient location during evaluation: PACU Anesthesia Type: General Level of consciousness: awake and alert Pain management: pain level controlled Vital Signs Assessment: post-procedure vital signs reviewed and stable Respiratory status: spontaneous breathing, nonlabored ventilation, respiratory function stable and patient connected to nasal cannula oxygen Cardiovascular status: blood pressure returned to baseline and stable Postop Assessment: no apparent nausea or vomiting Anesthetic complications: no   No complications documented.   Last Vitals:  Vitals:   09/04/19 0514 09/04/19 0748  BP: 120/68 118/75  Pulse: 88 (!) 109  Resp: 14 18  Temp: 37.1 C 37.2 C  SpO2:  95%    Last Pain:  Vitals:   09/04/19 0748  TempSrc: Oral  PainSc:                  Precious Haws Jerrika Ledlow

## 2019-09-04 NOTE — Progress Notes (Signed)
Hematology/Oncology Consult note [facility]  Telephone:([phone] [phone]  Patient Care Team: [place], [place], NP as PCP - General Rico Junker, RN as Registered Nurse Theodore Demark, RN as Registered Nurse   Name of the patient: Sophia [name]  [id number]  Nov 01, 1980   Date of visit: 09/04/2019  Interval history-reports back pain still persists but tolerable with present pain medications.  Plan is to discharge her home today with home physical therapy.  Last bowel movement was day before yesterday  ECOG PS- 2 Pain scale- 4 Opioid associated constipation- no  Review of systems- Review of Systems  Constitutional: Positive for malaise/fatigue. Negative for chills, fever and weight loss.  HENT: Negative for congestion, ear discharge and nosebleeds.   Eyes: Negative for blurred vision.  Respiratory: Negative for cough, hemoptysis, sputum production, shortness of breath and wheezing.   Cardiovascular: Negative for chest pain, palpitations, orthopnea and claudication.  Gastrointestinal: Negative for abdominal pain, blood in stool, constipation, diarrhea, heartburn, melena, nausea and vomiting.  Genitourinary: Negative for dysuria, flank pain, frequency, hematuria and urgency.  Musculoskeletal: Positive for back pain. Negative for joint pain and myalgias.  Skin: Negative for rash.  Neurological: Negative for dizziness, tingling, focal weakness, seizures, weakness and headaches.  Endo/Heme/Allergies: Does not bruise/bleed easily.  Psychiatric/Behavioral: Negative for depression and suicidal ideas. The patient does not have insomnia.      No Known Allergies   Past Medical History:  Diagnosis Date  . Anxiety   . Cancer ([place])   . Vertigo      Past Surgical History:  Procedure Laterality Date  . BREAST BIOPSY Left 08/14/2019   [place] bx of mass, coil marker, path pending  . BREAST BIOPSY Left 08/14/2019   [place] bx of  LN, hydromarker, path pending  . BREAST BIOPSY Left 08/14/2019   affirm bx of calcs, x marker, path pending  . INTRAMEDULLARY (IM) NAIL INTERTROCHANTERIC Right 09/01/2019   Procedure: INTRAMEDULLARY (IM) NAIL INTERTROCHANTRIC AND RADIOFREQUENCY ABLATION;  Surgeon: Hessie Knows, MD;  Location: [facility];  Service: Orthopedics;  Laterality: Right;  . KYPHOPLASTY N/A 08/29/2019   Procedure: KYPHOPLASTY T6, L1,L4 ,  RADIOFREQUENCY ABLATION;  Surgeon: Hessie Knows, MD;  Location: [facility];  Service: Orthopedics;  Laterality: N/A;  . KYPHOPLASTY Right 09/01/2019   Procedure: Right Sacral Radiofrequency Ablation and Cement Augmentation, Right sacrum and iliac crest;  Surgeon: Hessie Knows, MD;  Location: [facility];  Service: Orthopedics;  Laterality: Right;  . PORTA CATH INSERTION N/A 08/28/2019   Procedure: PORTA CATH INSERTION;  Surgeon: Algernon Huxley, MD;  Location: [facility];  Service: Cardiovascular;  Laterality: N/A;    Social History   Socioeconomic History  . Marital status: Single    Spouse name: Not on file  . Number of children: Not on file  . Years of education: Not on file  . Highest education level: Not on file  Occupational History  . Not on file  Tobacco Use  . Smoking status: Never Smoker  . Smokeless tobacco: Never Used  Vaping Use  . Vaping Use: Never used  Substance and Sexual Activity  . Alcohol use: Yes    Comment: occassionaly  . Drug use: Not Currently  . Sexual activity: Not Currently    Birth control/protection: None  Other Topics Concern  . Not on file  Social History Narrative   Lives at home with children   Social Determinants of Health   Financial Resource Strain:   .  Difficulty of Paying Living Expenses:   Food Insecurity:   . Worried About Charity fundraiser in the Last Year:   . Arboriculturist in the Last Year:   Transportation Needs:   . Film/video editor (Medical):   Marland Kitchen Lack of Transportation (Non-Medical):   Physical Activity:    . Days of Exercise per Week:   . Minutes of Exercise per Session:   Stress:   . Feeling of Stress :   Social Connections:   . Frequency of Communication with Friends and Family:   . Frequency of Social Gatherings with Friends and Family:   . Attends Religious Services:   . Active Member of Clubs or Organizations:   . Attends Archivist Meetings:   Marland Kitchen Marital Status:   Intimate Partner Violence:   . Fear of Current or Ex-Partner:   . Emotionally Abused:   Marland Kitchen Physically Abused:   . Sexually Abused:     Family History  Problem Relation Age of Onset  . Colon cancer Maternal Uncle      Current Facility-Administered Medications:  .  alum & mag hydroxide-simeth (MAALOX/MYLANTA) 200-200-20 MG/5ML suspension 30 mL, 30 mL, Oral, Q4H PRN, Hessie Knows, MD .  bisacodyl (DULCOLAX) suppository 10 mg, 10 mg, Rectal, Daily PRN, Hessie Knows, MD .  Chlorhexidine Gluconate Cloth 2 % PADS 6 each, 6 each, Topical, Daily, Hessie Knows, MD, 6 each at 09/03/19 0930 .  cyclobenzaprine (FLEXERIL) tablet 10 mg, 10 mg, Oral, TID PRN, Hessie Knows, MD, 10 mg at 08/29/19 2218 .  docusate sodium (COLACE) capsule 100 mg, 100 mg, Oral, BID, Hessie Knows, MD, 100 mg at 09/04/19 0847 .  enoxaparin (LOVENOX) injection 40 mg, 40 mg, Subcutaneous, Q24H, Hessie Knows, MD, 40 mg at 09/04/19 0847 .  gabapentin (NEURONTIN) capsule 300 mg, 300 mg, Oral, BID, Hessie Knows, MD, 300 mg at 09/04/19 0848 .  LORazepam (ATIVAN) injection 0.5 mg, 0.5 mg, Intravenous, Q6H PRN, Hessie Knows, MD .  LORazepam (ATIVAN) tablet 0.5 mg, 0.5 mg, Oral, Q6H PRN, Hessie Knows, MD, 0.5 mg at 09/03/19 2212 .  magnesium citrate solution 1 Bottle, 1 Bottle, Oral, Once PRN, Hessie Knows, MD .  magnesium hydroxide (MILK OF MAGNESIA) suspension 30 mL, 30 mL, Oral, Daily PRN, Hessie Knows, MD .  meclizine (ANTIVERT) tablet 25 mg, 25 mg, Oral, TID PRN, Hessie Knows, MD .  menthol-cetylpyridinium (CEPACOL) lozenge 3 mg, 1  lozenge, Oral, PRN **OR** phenol (CHLORASEPTIC) mouth spray 1 spray, 1 spray, Mouth/Throat, PRN, Hessie Knows, MD .  metoCLOPramide (REGLAN) tablet 5-10 mg, 5-10 mg, Oral, Q8H PRN **OR** metoCLOPramide (REGLAN) injection 5-10 mg, 5-10 mg, Intravenous, Q8H PRN, Hessie Knows, MD .  naphazoline-pheniramine (NAPHCON-A) 0.025-0.3 % ophthalmic solution 1 drop, 1 drop, Both Eyes, QID PRN, Hessie Knows, MD .  ondansetron (ZOFRAN) tablet 4 mg, 4 mg, Oral, Q6H PRN **OR** ondansetron (ZOFRAN) injection 4 mg, 4 mg, Intravenous, Q6H PRN, Hessie Knows, MD .  oxyCODONE (Oxy IR/ROXICODONE) immediate release tablet 5-10 mg, 5-10 mg, Oral, Q4H PRN, Guilford Shi, MD, 10 mg at 09/04/19 0848 .  oxyCODONE (OXYCONTIN) 12 hr tablet 10 mg, 10 mg, Oral, Q12H, Kamineni, Neelima, MD, 10 mg at 09/03/19 2212 .  senna-docusate (Senokot-S) tablet 1 tablet, 1 tablet, Oral, BID, Hessie Knows, MD, 1 tablet at 09/04/19 0848  Facility-Administered Medications Ordered in Other Encounters:  .  0.9 %  sodium chloride infusion, , Intravenous, Once PRN, Sindy Guadeloupe, MD, Stopped at 09/04/19 1425  Physical exam:  Vitals:   09/03/19 2007 09/04/19 0042 09/04/19 0514 09/04/19 0748  BP: 109/68 117/70 120/68 118/75  Pulse: (!) 108 (!) 102 88 (!) 109  Resp: '14 14 14 18  '$ Temp: 99 F (37.2 C) 98.8 F (37.1 C) 98.7 F (37.1 C) 99 F (37.2 C)  TempSrc: Oral  Oral Oral  SpO2: 98% 96%  95%  Weight:      Height:       Physical Exam Constitutional:      General: She is not in acute distress. Cardiovascular:     Rate and Rhythm: Regular rhythm. Tachycardia present.     Heart sounds: Normal heart sounds.  Pulmonary:     Effort: Pulmonary effort is normal.     Breath sounds: Normal breath sounds.  Abdominal:     General: Bowel sounds are normal.     Palpations: Abdomen is soft.  Skin:    General: Skin is warm and dry.  Neurological:     Mental Status: She is alert and oriented to person, place, and time.      CMP  Latest Ref Rng & Units 09/02/2019  Glucose 70 - 99 mg/dL 141(H)  BUN 6 - 20 mg/dL 13  Creatinine 0.44 - 1.00 mg/dL 0.47  Sodium 135 - 145 mmol/L 136  Potassium 3.5 - 5.1 mmol/L 3.9  Chloride 98 - 111 mmol/L 103  CO2 22 - 32 mmol/L 25  Calcium 8.9 - 10.3 mg/dL 8.0(L)  Total Protein 6.5 - 8.1 g/dL -  Total Bilirubin 0.3 - 1.2 mg/dL -  Alkaline Phos 38 - 126 U/L -  AST 15 - 41 U/L -  ALT 0 - 44 U/L -   CBC Latest Ref Rng & Units 09/02/2019  WBC 4.0 - 10.5 K/uL 23.5(H)  Hemoglobin 12.0 - 15.0 g/dL 11.4(L)  Hematocrit 36 - 46 % 33.4(L)  Platelets 150 - 400 K/uL 290    '@IMAGES'$ @  DG Thoracic Spine 2 View  Result Date: 08/29/2019 CLINICAL DATA:  Vertebral augmentation. EXAM: THORACIC SPINE 2 VIEWS; DG C-ARM 1-60 MIN COMPARISON:  MRI 08/25/2019 FINDINGS: Multiple C-arm images are provided. In the lumbar region, vertebral augmentation at L1 has a good appearance. Vertebral augmentation at the L4 level shows methylmethacrylate throughout the vertebral body, and also apparently based on this 1 projection a large amount within the spinal canal, measuring up to 9 mm in thickness and extending from the superior aspect of L4 down to the superior aspect of L5. This could result in compressive stenosis. Thoracic region imaging shows methylmethacrylate well distributed within the T6 vertebral body. There is probably some extension into the disc space. IMPRESSION: Vertebral augmentation at T6, L1 and L4. At L4, there appears to be ventral epidural cement measuring up to 9 mm in thickness. See above. Electronically Signed   By: Nelson Chimes M.D.   On: 08/29/2019 15:08   DG Sacrum/Coccyx  Result Date: 09/01/2019 CLINICAL DATA:  Metastatic disease to the sacrum and pelvic bones. EXAM: DG C-ARM 1-60 MIN; SACRUM AND COCCYX - 2+ VIEW FLUOROSCOPY TIME:  Fluoroscopy Time:  3 minutes 45 seconds COMPARISON:  PET-CT dated 08/27/2019 FINDINGS: AP and lateral C-arm images demonstrate contrast in the right sacral ala and  adjacent right iliac bone which correlates with metastatic lesions visible on PET-CT. IMPRESSION: Cement augmentation of the right ilium and right sacral ala after radiofrequency ablation of metastatic disease. C-arm fluoroscopic images were obtained intraoperatively and submitted for post operative interpretation. Electronically Signed   By: Lorriane Shire M.D.  On: 09/01/2019 16:02   MR Brain W and Wo Contrast  Result Date: 08/29/2019 CLINICAL DATA:  Metastatic breast cancer. EXAM: MRI HEAD WITHOUT AND WITH CONTRAST TECHNIQUE: Multiplanar, multiecho pulse sequences of the brain and surrounding structures were obtained without and with intravenous contrast. CONTRAST:  76m GADAVIST GADOBUTROL 1 MMOL/ML IV SOLN COMPARISON:  None. FINDINGS: Brain: There is no acute infarct, acute hemorrhage or extra-axial collection. Normal white matter signal. CSF spaces are normal. No contrast-enhancing lesions. No chronic microhemorrhage. Midline structures are normal. Vascular: Normal flow voids Skull and upper cervical spine: Normal bone marrow signal. Sinuses/Orbits: Paranasal sinuses and mastoids are clear. Normal orbits. Other: None IMPRESSION: Normal brain MRI. No evidence of intracranial metastatic disease. Electronically Signed   By: KUlyses JarredM.D.   On: 08/29/2019 02:07   MR Cervical Spine W Wo Contrast  Result Date: 08/25/2019 CLINICAL DATA:  Back pain with bilateral lower extremity radiculopathy. Breast cancer. Reported history of abnormal lumbar spine MRI EXAM: MRI CERVICAL, THORACIC AND LUMBAR SPINE WITHOUT AND WITH CONTRAST TECHNIQUE: Multiplanar and multiecho pulse sequences of the cervical spine, to include the craniocervical junction and cervicothoracic junction, and thoracic and lumbar spine, were obtained without and with intravenous contrast. CONTRAST:  6 mL Gadavist IV contrast COMPARISON:  No comparison studies are available at the time of dictation FINDINGS: MRI CERVICAL SPINE FINDINGS Alignment:  No static listhesis. Straightening of the cervical lordosis. Vertebrae: Rounded enhancing 9 mm lesion within the mid C2 vertebral body. Additional enhancing lesion centered within the left posterior aspect of the C5 vertebral body measuring up to 10 mm. No enhancing soft tissue mass. No evidence of epidural involvement or extension. Cord: Normal signal and morphology. No cord expansion or focal cord lesion. Posterior Fossa, vertebral arteries, paraspinal tissues: Enhancing marrow replacing lesion involving the left occipital condyle measuring 1.8 cm (series 6, image 14). No abnormality within the visualized posterior fossa. Paraspinal soft tissues are within normal limits. Disc levels: No significant disc protrusion, foraminal stenosis, or canal stenosis at any level. Cervical intervertebral discs remain well hydrated with preservation of disc height. Normal cervical facet joints. MRI THORACIC SPINE FINDINGS Alignment: No static listhesis. 4 mm of bony retropulsion related to pathologic fracture of the T6 vertebrae. Vertebrae: Rounded 7 mm enhancing lesion within the anterior aspect of the T4 vertebral body. Small 2-3 mm lesion within the T5 vertebral body. Diffuse metastatic involvement of the T6 level with acute pathologic fracture where there is approximately 50% vertebral body height loss and 4 mm of bony retropulsion. Small 2-3 mm enhancing lesion near the right T8 pedicle. 9 mm enhancing lesion near the left T9 pedicle. 7 mm enhancing metastatic lesion in the anterior aspect of the T10 vertebral body. Multiple enhancing foci within the T12 vertebral body where there is a chronic appearing mild superior endplate compression fracture without residual marrow edema. No enhancing soft tissue mass. No evidence of epidural involvement or extension. There is also a enhancing expansile rib lesion involving the posterior right third rib (series 25, image 7. Rib lesion measures approximately 2.9 x 1.3 cm. Suspect  metastatic lesion of the manubrium (series 26, image 12). Cord: Normal signal and morphology. No cord expansion or focal cord lesion. Paraspinal and other soft tissues: No acute findings. Disc levels: Thoracic vertebral body heights are maintained with well-hydrated discs. There is no degenerative disc or facet arthropathy of the lumbar spine. No disc protrusion. Bony retropulsion at T6 contacts the ventral cord without canal stenosis. MRI LUMBAR SPINE FINDINGS Segmentation:  Standard. Alignment: No static listhesis. 4 mm of bony retropulsion related to pathologic fracture of the L1 vertebral body. Vertebrae: Diffuse metastatic lesion throughout the L1 vertebral body with acute pathologic compression fracture with approximately 75% vertebral body height loss centrally and 4 mm of bony retropulsion. Enhancing 9 mm lesion at the superior endplate and right pedicle of L2. A few tiny enhancing foci are evident within the L3 and L5 vertebral bodies. Large enhancing lesion throughout the L4 vertebral body with acute pathologic fracture and approximately 60% vertebral body height loss centrally. Additional areas of enhancing metastatic disease are seen throughout the sacrum, most predominantly involving the lateral right sacral ala (series 6, image 3) rounded enhancing metastatic lesion within the right ilium adjacent to the right SI joint (series 7, image 35). Conus medullaris and cauda equina: Conus extends to the L1 level. Conus and cauda equina appear unremarkable without abnormal enhancement. Paraspinal and other soft tissues: Negative. Disc levels: T12-L1: No significant disc protrusion, foraminal stenosis, or canal stenosis. Bony retropulsion of the L1 vertebral body results in mild canal stenosis. L1-L2: No significant disc protrusion, foraminal stenosis, or canal stenosis. L2-L3: No significant disc protrusion, foraminal stenosis, or canal stenosis. L3-L4: Minimal disc bulge without foraminal or canal stenosis.  L4-L5: Mild disc bulge, eccentric to the left without evidence of foraminal or canal stenosis. L5-S1: Diffuse disc bulge with a superimposed small central disc protrusion contacting the ventral thecal sac without canal stenosis. Bilateral foramina remain patent. IMPRESSION: 1. 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 There is acute pathologic compression fractures of T6, L1, and L4, as described above. Mild canal stenosis related to retropulsion of the L1 vertebral body. There is no high-grade foraminal or canal stenosis at any spinal level. 2. No evidence of extraosseous spinal soft tissue mass or epidural involvement. 3. Expansile posterior right third rib metastatic lesion. 4. Additional metastatic lesions are seen within the left occipital condyle, manubrium, sacrum, and posterior right ilium. These results will be called to the ordering clinician or representative by the Radiologist Assistant, and communication documented in the PACS or [employer]. Electronically Signed   By: Davina Poke D.O.   On: 08/25/2019 15:25   MR Thoracic Spine W Wo Contrast  Result Date: 08/25/2019 CLINICAL DATA:  Back pain with bilateral lower extremity radiculopathy. Breast cancer. Reported history of abnormal lumbar spine MRI EXAM: MRI CERVICAL, THORACIC AND LUMBAR SPINE WITHOUT AND WITH CONTRAST TECHNIQUE: Multiplanar and multiecho pulse sequences of the cervical spine, to include the craniocervical junction and cervicothoracic junction, and thoracic and lumbar spine, were obtained without and with intravenous contrast. CONTRAST:  6 mL Gadavist IV contrast COMPARISON:  No comparison studies are available at the time of dictation FINDINGS: MRI CERVICAL SPINE FINDINGS Alignment: No static listhesis. Straightening of the cervical lordosis. Vertebrae: Rounded enhancing 9 mm lesion within the mid C2 vertebral body. Additional enhancing lesion centered within the left  posterior aspect of the C5 vertebral body measuring up to 10 mm. No enhancing soft tissue mass. No evidence of epidural involvement or extension. Cord: Normal signal and morphology. No cord expansion or focal cord lesion. Posterior Fossa, vertebral arteries, paraspinal tissues: Enhancing marrow replacing lesion involving the left occipital condyle measuring 1.8 cm (series 6, image 14). No abnormality within the visualized posterior fossa. Paraspinal soft tissues are within normal limits. Disc levels: No significant disc protrusion, foraminal stenosis, or canal stenosis at any level. Cervical intervertebral discs remain well hydrated with preservation of disc height.  Normal cervical facet joints. MRI THORACIC SPINE FINDINGS Alignment: No static listhesis. 4 mm of bony retropulsion related to pathologic fracture of the T6 vertebrae. Vertebrae: Rounded 7 mm enhancing lesion within the anterior aspect of the T4 vertebral body. Small 2-3 mm lesion within the T5 vertebral body. Diffuse metastatic involvement of the T6 level with acute pathologic fracture where there is approximately 50% vertebral body height loss and 4 mm of bony retropulsion. Small 2-3 mm enhancing lesion near the right T8 pedicle. 9 mm enhancing lesion near the left T9 pedicle. 7 mm enhancing metastatic lesion in the anterior aspect of the T10 vertebral body. Multiple enhancing foci within the T12 vertebral body where there is a chronic appearing mild superior endplate compression fracture without residual marrow edema. No enhancing soft tissue mass. No evidence of epidural involvement or extension. There is also a enhancing expansile rib lesion involving the posterior right third rib (series 25, image 7. Rib lesion measures approximately 2.9 x 1.3 cm. Suspect metastatic lesion of the manubrium (series 26, image 12). Cord: Normal signal and morphology. No cord expansion or focal cord lesion. Paraspinal and other soft tissues: No acute findings. Disc  levels: Thoracic vertebral body heights are maintained with well-hydrated discs. There is no degenerative disc or facet arthropathy of the lumbar spine. No disc protrusion. Bony retropulsion at T6 contacts the ventral cord without canal stenosis. MRI LUMBAR SPINE FINDINGS Segmentation:  Standard. Alignment: No static listhesis. 4 mm of bony retropulsion related to pathologic fracture of the L1 vertebral body. Vertebrae: Diffuse metastatic lesion throughout the L1 vertebral body with acute pathologic compression fracture with approximately 75% vertebral body height loss centrally and 4 mm of bony retropulsion. Enhancing 9 mm lesion at the superior endplate and right pedicle of L2. A few tiny enhancing foci are evident within the L3 and L5 vertebral bodies. Large enhancing lesion throughout the L4 vertebral body with acute pathologic fracture and approximately 60% vertebral body height loss centrally. Additional areas of enhancing metastatic disease are seen throughout the sacrum, most predominantly involving the lateral right sacral ala (series 6, image 3) rounded enhancing metastatic lesion within the right ilium adjacent to the right SI joint (series 7, image 35). Conus medullaris and cauda equina: Conus extends to the L1 level. Conus and cauda equina appear unremarkable without abnormal enhancement. Paraspinal and other soft tissues: Negative. Disc levels: T12-L1: No significant disc protrusion, foraminal stenosis, or canal stenosis. Bony retropulsion of the L1 vertebral body results in mild canal stenosis. L1-L2: No significant disc protrusion, foraminal stenosis, or canal stenosis. L2-L3: No significant disc protrusion, foraminal stenosis, or canal stenosis. L3-L4: Minimal disc bulge without foraminal or canal stenosis. L4-L5: Mild disc bulge, eccentric to the left without evidence of foraminal or canal stenosis. L5-S1: Diffuse disc bulge with a superimposed small central disc protrusion contacting the ventral  thecal sac without canal stenosis. Bilateral foramina remain patent. IMPRESSION: 1. 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 There is acute pathologic compression fractures of T6, L1, and L4, as described above. Mild canal stenosis related to retropulsion of the L1 vertebral body. There is no high-grade foraminal or canal stenosis at any spinal level. 2. No evidence of extraosseous spinal soft tissue mass or epidural involvement. 3. Expansile posterior right third rib metastatic lesion. 4. Additional metastatic lesions are seen within the left occipital condyle, manubrium, sacrum, and posterior right ilium. These results will be called to the ordering clinician or representative by the Radiologist Assistant, and communication  documented in the PACS or [employer]. Electronically Signed   By: Duanne Guess D.O.   On: 08/25/2019 15:25   MR Lumbar Spine W Wo Contrast  Result Date: 08/25/2019 CLINICAL DATA:  Back pain with bilateral lower extremity radiculopathy. Breast cancer. Reported history of abnormal lumbar spine MRI EXAM: MRI CERVICAL, THORACIC AND LUMBAR SPINE WITHOUT AND WITH CONTRAST TECHNIQUE: Multiplanar and multiecho pulse sequences of the cervical spine, to include the craniocervical junction and cervicothoracic junction, and thoracic and lumbar spine, were obtained without and with intravenous contrast. CONTRAST:  6 mL Gadavist IV contrast COMPARISON:  No comparison studies are available at the time of dictation FINDINGS: MRI CERVICAL SPINE FINDINGS Alignment: No static listhesis. Straightening of the cervical lordosis. Vertebrae: Rounded enhancing 9 mm lesion within the mid C2 vertebral body. Additional enhancing lesion centered within the left posterior aspect of the C5 vertebral body measuring up to 10 mm. No enhancing soft tissue mass. No evidence of epidural involvement or extension. Cord: Normal signal and morphology. No cord expansion or  focal cord lesion. Posterior Fossa, vertebral arteries, paraspinal tissues: Enhancing marrow replacing lesion involving the left occipital condyle measuring 1.8 cm (series 6, image 14). No abnormality within the visualized posterior fossa. Paraspinal soft tissues are within normal limits. Disc levels: No significant disc protrusion, foraminal stenosis, or canal stenosis at any level. Cervical intervertebral discs remain well hydrated with preservation of disc height. Normal cervical facet joints. MRI THORACIC SPINE FINDINGS Alignment: No static listhesis. 4 mm of bony retropulsion related to pathologic fracture of the T6 vertebrae. Vertebrae: Rounded 7 mm enhancing lesion within the anterior aspect of the T4 vertebral body. Small 2-3 mm lesion within the T5 vertebral body. Diffuse metastatic involvement of the T6 level with acute pathologic fracture where there is approximately 50% vertebral body height loss and 4 mm of bony retropulsion. Small 2-3 mm enhancing lesion near the right T8 pedicle. 9 mm enhancing lesion near the left T9 pedicle. 7 mm enhancing metastatic lesion in the anterior aspect of the T10 vertebral body. Multiple enhancing foci within the T12 vertebral body where there is a chronic appearing mild superior endplate compression fracture without residual marrow edema. No enhancing soft tissue mass. No evidence of epidural involvement or extension. There is also a enhancing expansile rib lesion involving the posterior right third rib (series 25, image 7. Rib lesion measures approximately 2.9 x 1.3 cm. Suspect metastatic lesion of the manubrium (series 26, image 12). Cord: Normal signal and morphology. No cord expansion or focal cord lesion. Paraspinal and other soft tissues: No acute findings. Disc levels: Thoracic vertebral body heights are maintained with well-hydrated discs. There is no degenerative disc or facet arthropathy of the lumbar spine. No disc protrusion. Bony retropulsion at T6 contacts  the ventral cord without canal stenosis. MRI LUMBAR SPINE FINDINGS Segmentation:  Standard. Alignment: No static listhesis. 4 mm of bony retropulsion related to pathologic fracture of the L1 vertebral body. Vertebrae: Diffuse metastatic lesion throughout the L1 vertebral body with acute pathologic compression fracture with approximately 75% vertebral body height loss centrally and 4 mm of bony retropulsion. Enhancing 9 mm lesion at the superior endplate and right pedicle of L2. A few tiny enhancing foci are evident within the L3 and L5 vertebral bodies. Large enhancing lesion throughout the L4 vertebral body with acute pathologic fracture and approximately 60% vertebral body height loss centrally. Additional areas of enhancing metastatic disease are seen throughout the sacrum, most predominantly involving the lateral right sacral ala (series  6, image 3) rounded enhancing metastatic lesion within the right ilium adjacent to the right SI joint (series 7, image 35). Conus medullaris and cauda equina: Conus extends to the L1 level. Conus and cauda equina appear unremarkable without abnormal enhancement. Paraspinal and other soft tissues: Negative. Disc levels: T12-L1: No significant disc protrusion, foraminal stenosis, or canal stenosis. Bony retropulsion of the L1 vertebral body results in mild canal stenosis. L1-L2: No significant disc protrusion, foraminal stenosis, or canal stenosis. L2-L3: No significant disc protrusion, foraminal stenosis, or canal stenosis. L3-L4: Minimal disc bulge without foraminal or canal stenosis. L4-L5: Mild disc bulge, eccentric to the left without evidence of foraminal or canal stenosis. L5-S1: Diffuse disc bulge with a superimposed small central disc protrusion contacting the ventral thecal sac without canal stenosis. Bilateral foramina remain patent. IMPRESSION: 1. 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 There is acute pathologic  compression fractures of T6, L1, and L4, as described above. Mild canal stenosis related to retropulsion of the L1 vertebral body. There is no high-grade foraminal or canal stenosis at any spinal level. 2. No evidence of extraosseous spinal soft tissue mass or epidural involvement. 3. Expansile posterior right third rib metastatic lesion. 4. Additional metastatic lesions are seen within the left occipital condyle, manubrium, sacrum, and posterior right ilium. These results will be called to the ordering clinician or representative by the Radiologist Assistant, and communication documented in the PACS or [employer]. Electronically Signed   By: Davina Poke D.O.   On: 08/25/2019 15:25   PERIPHERAL VASCULAR CATHETERIZATION  Result Date: 08/28/2019 See op note  NM PET Image Initial (PI) Skull Base To Thigh  Addendum Date: 08/27/2019   ADDENDUM REPORT: 08/27/2019 17:27 ADDENDUM: These results will be called to the ordering clinician or representative by the Radiologist Assistant, and communication documented in the PACS or [employer]. Electronically Signed   By: Zetta Bills M.D.   On: 08/27/2019 17:27   Result Date: 08/27/2019 CLINICAL DATA:  Initial treatment strategy for breast cancer. EXAM: NUCLEAR MEDICINE PET SKULL BASE TO THIGH TECHNIQUE: 7.05 mCi F-18 FDG was injected intravenously. Full-ring PET imaging was performed from the skull base to thigh after the radiotracer. CT data was obtained and used for attenuation correction and anatomic localization. Fasting blood glucose: 78 mg/dl COMPARISON:  Spinal evaluations of 08/25/2019 FINDINGS: Mediastinal blood pool activity: SUV max 2.72 Liver activity: SUV max not applicable NECK: Asymmetric tonsillar activity RIGHT palatine tonsils approximately select approximately SUV 10.4, on the LEFT 4.5. No hypermetabolic lymph nodes in the neck, see below for thoracic inlet lymph nodes. Incidental CT findings: none CHEST: LEFT supraclavicular lymph  node (image 55 of series 4) (SUVmax = 3.7 LEFT axillary lymph node (image 60, series 4) 7 mm (SUVmax = 5.1 with similar size and metabolically active lymph nodes in the area at least 7 additional lymph nodes. Smaller mildly hypermetabolic lymph nodes tracking towards the LEFT supraclavicular region under LEFT subpectoral musculature with a tiny lymph node with the maximum SUV of 2.5 seen on image 57 of series 4 measuring 4 mm. See below for rib lesions.) Asymmetric soft tissue in the LEFT breast and marker for biopsy in an area with a maximum SUV of 7.9 that measures approximately 2.7 cm maximal dimension (image 80, series 4) Contralateral axillary lymph node (SUVmax = 2.6 (image 65, series 4) 6 mm.) Incidental CT findings: Mild calcified atheromatous plaque in the thoracic aorta. Heart size normal without pericardial effusion. Central pulmonary vasculature is  normal caliber. Signs of basilar atelectasis question of background ground-glass but no consolidation, no pleural effusion or suspicious pulmonary nodule. ABDOMEN/PELVIS: No abnormal hypermetabolic activity within the liver, pancreas, adrenal glands, or spleen. No hypermetabolic lymph nodes in the abdomen or pelvis. Bilateral uptake in the adnexa along the expected course of the LEFT and RIGHT ureter is slightly more diffuse than expected for ureteral related activity but given bilateral appearance this is suspected Incidental CT findings: Cystic structures in the RIGHT adnexa without FDG uptake likely ovarian cysts largest measuring approximately 2.7 x 2.4 cm. RIGHT intrarenal calculus. Interpolar RIGHT kidney 4 mm. No acute gastrointestinal process. The appendix is normal. SKELETON: Diffuse skeletal metastatic disease. LEFT C1/skull base lesion with soft tissue at the foramen magnum (image 17, series 4) (SUVmax = 9.2 soft tissue in this area measuring approximately 12 x 12 mm and extending into the occipital condyle on the LEFT likely and also involving C1.  C2 metastatic focus markedly hypermetabolic (SUVmax = 9.1 associated with vertebral body destruction measuring approximately 9 x 6 mm. Destructive changes at the C5 level also with marked hypermetabolic activity. Lesion associated with vertebral body destruction at T5-T6 showing marked hypermetabolic activity better demonstrated on the thoracic spine MRI. Pathologic fracture also at the L1 level and with adjacent destruction of L2 is associated with hypermetabolic activity as are multiple other foci in the lumbar spine. Lesion without CT correlate in the RIGHT proximal humerus. Destructive lesion involving the RIGHT second rib (SUVmax = 13.6 signs of metastasis to the sternal body, LEFT anterior third rib, RIGHT hemi sacrum, bilateral iliac bones, LEFT ischium and RIGHT femoral neck with smaller focus in the LEFT femoral shaft. Of particular concern are lesions with bone destruction at the first sacral level within the sacral ala measuring 2.8 x 1.9 cm (SUVmax = 11.5 similar activity with some bone destruction along the sacroiliac joint measuring 1.4 by 0.8 cm adjacent to the sacral lesion. Another area of considerable concern is the cortical destruction and soft tissue associated with RIGHT femoral neck (image 216, series 4) (SUVmax = 12.9) This is followed by the diffuse activity and cortical destruction noted in the LEFT iliac bone which is associated with extensive periosteal reaction extending from the ischium through the iliac and associated with a small fracture in the nonweightbearing portion of the iliac bone but just above the LEFT acetabulum. (SUVmax = 13.7.) ) Incidental CT findings: none IMPRESSION: 1. 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 Redemonstration of metastatic, pathologic fractures associated with multiple sites of metastatic disease throughout the spine. 2. Sites of concern for future pathologic fracture of the RIGHT sacral ala and RIGHT proximal  femur. 3. Pathologic fracture along the anterior aspect of the LEFT iliac bone just above the acetabulum with diffuse iliac and ischial 4. Asymmetric tonsillar/base of tongue activity in the neck is of uncertain significance. Direct clinical inspection or close attention on follow-up is suggested. 5. LEFT breast activity and adenopathy as discussed. 6. Area of activity in the bilateral adnexa favored to represent distal ureters. Attention on follow-up. Correlation could be made with contrasted imaging of the chest, abdomen and pelvis. Electronically Signed: By: Zetta Bills M.D. On: 08/27/2019 17:23   DG C-Arm 1-60 Min  Result Date: 09/01/2019 CLINICAL DATA:  Intramedullary nail insertion right femur secondary to metastatic disease and pinning fracture. EXAM: DG C-ARM 1-60 MIN; RIGHT FEMUR 2 VIEWS FLUOROSCOPY TIME:  Fluoroscopy Time:  3 minutes 45 seconds COMPARISON:  CT dated 08/27/2019  FINDINGS: C-arm images demonstrate insertion of intramedullary nail and proximal and distal screws in the right femur. The hardware appears in good position. Fracture. IMPRESSION: Satisfactory appearance of the right femur after intramedullary nail insertion. C-arm fluoroscopic images were obtained intraoperatively and submitted for post operative interpretation. Electronically Signed   By: Lorriane Shire M.D.   On: 09/01/2019 16:05   DG C-Arm 1-60 Min  Result Date: 09/01/2019 CLINICAL DATA:  Metastatic disease to the sacrum and pelvic bones. EXAM: DG C-ARM 1-60 MIN; SACRUM AND COCCYX - 2+ VIEW FLUOROSCOPY TIME:  Fluoroscopy Time:  3 minutes 45 seconds COMPARISON:  PET-CT dated 08/27/2019 FINDINGS: AP and lateral C-arm images demonstrate contrast in the right sacral ala and adjacent right iliac bone which correlates with metastatic lesions visible on PET-CT. IMPRESSION: Cement augmentation of the right ilium and right sacral ala after radiofrequency ablation of metastatic disease. C-arm fluoroscopic images were obtained  intraoperatively and submitted for post operative interpretation. Electronically Signed   By: Lorriane Shire M.D.   On: 09/01/2019 16:02   DG C-Arm 1-60 Min  Result Date: 08/29/2019 CLINICAL DATA:  Vertebral augmentation. EXAM: THORACIC SPINE 2 VIEWS; DG C-ARM 1-60 MIN COMPARISON:  MRI 08/25/2019 FINDINGS: Multiple C-arm images are provided. In the lumbar region, vertebral augmentation at L1 has a good appearance. Vertebral augmentation at the L4 level shows methylmethacrylate throughout the vertebral body, and also apparently based on this 1 projection a large amount within the spinal canal, measuring up to 9 mm in thickness and extending from the superior aspect of L4 down to the superior aspect of L5. This could result in compressive stenosis. Thoracic region imaging shows methylmethacrylate well distributed within the T6 vertebral body. There is probably some extension into the disc space. IMPRESSION: Vertebral augmentation at T6, L1 and L4. At L4, there appears to be ventral epidural cement measuring up to 9 mm in thickness. See above. Electronically Signed   By: Nelson Chimes M.D.   On: 08/29/2019 15:08   DG C-Arm 1-60 Min  Result Date: 08/29/2019 CLINICAL DATA:  Vertebral augmentation. EXAM: THORACIC SPINE 2 VIEWS; DG C-ARM 1-60 MIN COMPARISON:  MRI 08/25/2019 FINDINGS: Multiple C-arm images are provided. In the lumbar region, vertebral augmentation at L1 has a good appearance. Vertebral augmentation at the L4 level shows methylmethacrylate throughout the vertebral body, and also apparently based on this 1 projection a large amount within the spinal canal, measuring up to 9 mm in thickness and extending from the superior aspect of L4 down to the superior aspect of L5. This could result in compressive stenosis. Thoracic region imaging shows methylmethacrylate well distributed within the T6 vertebral body. There is probably some extension into the disc space. IMPRESSION: Vertebral augmentation at T6, L1  and L4. At L4, there appears to be ventral epidural cement measuring up to 9 mm in thickness. See above. Electronically Signed   By: Nelson Chimes M.D.   On: 08/29/2019 15:08   US BREAST LTD UNI LEFT INC AXILLA  Result Date: 08/07/2019 CLINICAL DATA:  Patient presents for evaluation of palpable abnormality within the left breast. EXAM: DIGITAL DIAGNOSTIC BILATERAL MAMMOGRAM WITH CAD AND TOMO ULTRASOUND BILATERAL BREAST COMPARISON:  Previous exam(s). ACR Breast Density Category c: The breast tissue is heterogeneously dense, which may obscure small masses. FINDINGS: Underlying the palpable marker within the upper outer left breast is a focal area of masslike distortion. There are a few coarse calcifications associated with this mass. Within the lateral left breast anterior to middle depth there  is a 5 mm group of coarse heterogeneous calcifications. Questioned asymmetry within the superior right breast resolved with additional imaging. Mammographic images were processed with CAD. On physical exam, there is a firm mass within the upper-outer left breast. Targeted ultrasound is performed, showing a 3.1 x 1.9 x 2.9 cm lobular hypoechoic mass left breast 1 o'clock position 4 cm from nipple corresponding with mass on mammogram. At least 4 abnormal cortically thickened left axillary lymph nodes are demonstrated measuring up to 5 mm. Dense tissue is demonstrated within the superior right breast. No suspicious mass is identified. IMPRESSION: 1. Suspicious palpable left breast mass 1 o'clock position. 2. Multiple (at least 4) cortically thickened left axillary lymph nodes. 3. Suspicious calcifications outer left breast middle depth. RECOMMENDATION: 1. Ultrasound-guided core needle biopsy left breast mass 1 o'clock position. 2. Ultrasound-guided core needle biopsy of 1 of the cortically thickened left axillary lymph nodes. 3. Stereotactic guided core needle biopsy of the coarse heterogeneous calcifications within the outer  left breast. I have discussed the findings and recommendations with the patient. If applicable, a reminder letter will be sent to the patient regarding the next appointment. BI-RADS CATEGORY  5: Highly suggestive of malignancy. Electronically Signed   By: Lovey Newcomer M.D.   On: 08/07/2019 12:06   US BREAST LTD UNI RIGHT INC AXILLA  Result Date: 08/07/2019 CLINICAL DATA:  Patient presents for evaluation of palpable abnormality within the left breast. EXAM: DIGITAL DIAGNOSTIC BILATERAL MAMMOGRAM WITH CAD AND TOMO ULTRASOUND BILATERAL BREAST COMPARISON:  Previous exam(s). ACR Breast Density Category c: The breast tissue is heterogeneously dense, which may obscure small masses. FINDINGS: Underlying the palpable marker within the upper outer left breast is a focal area of masslike distortion. There are a few coarse calcifications associated with this mass. Within the lateral left breast anterior to middle depth there is a 5 mm group of coarse heterogeneous calcifications. Questioned asymmetry within the superior right breast resolved with additional imaging. Mammographic images were processed with CAD. On physical exam, there is a firm mass within the upper-outer left breast. Targeted ultrasound is performed, showing a 3.1 x 1.9 x 2.9 cm lobular hypoechoic mass left breast 1 o'clock position 4 cm from nipple corresponding with mass on mammogram. At least 4 abnormal cortically thickened left axillary lymph nodes are demonstrated measuring up to 5 mm. Dense tissue is demonstrated within the superior right breast. No suspicious mass is identified. IMPRESSION: 1. Suspicious palpable left breast mass 1 o'clock position. 2. Multiple (at least 4) cortically thickened left axillary lymph nodes. 3. Suspicious calcifications outer left breast middle depth. RECOMMENDATION: 1. Ultrasound-guided core needle biopsy left breast mass 1 o'clock position. 2. Ultrasound-guided core needle biopsy of 1 of the cortically thickened left  axillary lymph nodes. 3. Stereotactic guided core needle biopsy of the coarse heterogeneous calcifications within the outer left breast. I have discussed the findings and recommendations with the patient. If applicable, a reminder letter will be sent to the patient regarding the next appointment. BI-RADS CATEGORY  5: Highly suggestive of malignancy. Electronically Signed   By: Lovey Newcomer M.D.   On: 08/07/2019 12:06   ECHOCARDIOGRAM COMPLETE  Result Date: 08/26/2019    ECHOCARDIOGRAM REPORT   Patient Name:   ZENAB GRONEWOLD [facility] Date of Exam: 08/26/2019 Medical Rec #:  [id number]             Height:       61.0 in Accession #:    [id number]  Weight:       139.2 lb Date of Birth:  1980-08-13             BSA:          1.619 m Patient Age:    38 years              BP:           112/54 mmHg Patient Gender: F                     HR:           94 bpm. Exam Location:  [facility] Procedure: 2D Echo, Color Doppler and Cardiac Doppler Indications:     v58.11 Chemotherapy evaluation  History:         Patient has no prior history of Echocardiogram examinations.                  Vertigo.  Sonographer:     Charmayne Sheer RDCS (AE) Referring Phys:  [id number] Weston Anna Maeghan Canny Diagnosing Phys: Harrell Gave End MD  Sonographer Comments: Suboptimal subcostal window. IMPRESSIONS  1. Left ventricular ejection fraction, by estimation, is 65 to 70%. The left ventricle has normal function. The left ventricle has no regional wall motion abnormalities. Left ventricular diastolic parameters are indeterminate.  2. Right ventricular systolic function is normal. The right ventricular size is normal.  3. The mitral valve is normal in structure. No evidence of mitral valve regurgitation. No evidence of mitral stenosis.  4. The aortic valve is grossly normal. Aortic valve regurgitation is not visualized. No aortic stenosis is present.  5. The inferior vena cava is normal in size with greater than 50% respiratory variability, suggesting right atrial  pressure of 3 mmHg. FINDINGS  Left Ventricle: Left ventricular ejection fraction, by estimation, is 65 to 70%. The left ventricle has normal function. The left ventricle has no regional wall motion abnormalities. The left ventricular internal cavity size was normal in size. There is  no left ventricular hypertrophy. Left ventricular diastolic parameters are indeterminate. Right Ventricle: The right ventricular size is normal. No increase in right ventricular wall thickness. Right ventricular systolic function is normal. Left Atrium: Left atrial size was normal in size. Right Atrium: Right atrial size was not well visualized. Pericardium: The pericardium was not well visualized. Mitral Valve: The mitral valve is normal in structure. No evidence of mitral valve regurgitation. No evidence of mitral valve stenosis. MV peak gradient, 3.2 mmHg. The mean mitral valve gradient is 1.0 mmHg. Tricuspid Valve: The tricuspid valve is not well visualized. Tricuspid valve regurgitation is not demonstrated. Aortic Valve: The aortic valve is grossly normal. Aortic valve regurgitation is not visualized. No aortic stenosis is present. Aortic valve mean gradient measures 3.0 mmHg. Aortic valve peak gradient measures 5.3 mmHg. Aortic valve area, by VTI measures 1.60 cm. Pulmonic Valve: The pulmonic valve was not well visualized. Pulmonic valve regurgitation is not visualized. No evidence of pulmonic stenosis. Aorta: The aortic root is normal in size and structure. Pulmonary Artery: The pulmonary artery is of normal size. Venous: The inferior vena cava is normal in size with greater than 50% respiratory variability, suggesting right atrial pressure of 3 mmHg. IAS/Shunts: The interatrial septum was not well visualized.  LEFT VENTRICLE PLAX 2D LVIDd:         4.21 cm  Diastology LVIDs:         2.58 cm  LV e' lateral:   9.25 cm/s LV PW:  0.82 cm  LV E/e' lateral: 6.7 LV IVS:        0.69 cm  LV e' medial:    7.51 cm/s LVOT diam:      1.60 cm  LV E/e' medial:  8.2 LV SV:         29 LV SV Index:   18 LVOT Area:     2.01 cm  LEFT ATRIUM             Index LA diam:        2.60 cm 1.61 cm/m LA Vol (A2C):   20.5 ml 12.66 ml/m LA Vol (A4C):   29.3 ml 18.10 ml/m LA Biplane Vol: 26.1 ml 16.12 ml/m  AORTIC VALVE                   PULMONIC VALVE AV Area (Vmax):    1.74 cm    PV Vmax:       1.08 m/s AV Area (Vmean):   1.69 cm    PV Vmean:      76.800 cm/s AV Area (VTI):     1.60 cm    PV VTI:        0.202 m AV Vmax:           115.00 cm/s PV Peak grad:  4.7 mmHg AV Vmean:          75.100 cm/s PV Mean grad:  3.0 mmHg AV VTI:            0.179 m AV Peak Grad:      5.3 mmHg AV Mean Grad:      3.0 mmHg LVOT Vmax:         99.80 cm/s LVOT Vmean:        63.000 cm/s LVOT VTI:          0.142 m LVOT/AV VTI ratio: 0.79  AORTA Ao Root diam: 2.60 cm MITRAL VALVE MV Area (PHT): 6.17 cm    SHUNTS MV Peak grad:  3.2 mmHg    Systemic VTI:  0.14 m MV Mean grad:  1.0 mmHg    Systemic Diam: 1.60 cm MV Vmax:       0.89 m/s MV Vmean:      58.1 cm/s MV Decel Time: 123 msec MV E velocity: 61.60 cm/s MV A velocity: 77.60 cm/s MV E/A ratio:  0.79 Harrell Gave End MD Electronically signed by Nelva Bush MD Signature Date/Time: 08/26/2019/1:28:27 PM    Final    MS DIGITAL DIAG TOMO BILAT  Result Date: 08/07/2019 CLINICAL DATA:  Patient presents for evaluation of palpable abnormality within the left breast. EXAM: DIGITAL DIAGNOSTIC BILATERAL MAMMOGRAM WITH CAD AND TOMO ULTRASOUND BILATERAL BREAST COMPARISON:  Previous exam(s). ACR Breast Density Category c: The breast tissue is heterogeneously dense, which may obscure small masses. FINDINGS: Underlying the palpable marker within the upper outer left breast is a focal area of masslike distortion. There are a few coarse calcifications associated with this mass. Within the lateral left breast anterior to middle depth there is a 5 mm group of coarse heterogeneous calcifications. Questioned asymmetry within the superior right  breast resolved with additional imaging. Mammographic images were processed with CAD. On physical exam, there is a firm mass within the upper-outer left breast. Targeted ultrasound is performed, showing a 3.1 x 1.9 x 2.9 cm lobular hypoechoic mass left breast 1 o'clock position 4 cm from nipple corresponding with mass on mammogram. At least 4 abnormal cortically thickened left axillary lymph nodes are demonstrated measuring  up to 5 mm. Dense tissue is demonstrated within the superior right breast. No suspicious mass is identified. IMPRESSION: 1. Suspicious palpable left breast mass 1 o'clock position. 2. Multiple (at least 4) cortically thickened left axillary lymph nodes. 3. Suspicious calcifications outer left breast middle depth. RECOMMENDATION: 1. Ultrasound-guided core needle biopsy left breast mass 1 o'clock position. 2. Ultrasound-guided core needle biopsy of 1 of the cortically thickened left axillary lymph nodes. 3. Stereotactic guided core needle biopsy of the coarse heterogeneous calcifications within the outer left breast. I have discussed the findings and recommendations with the patient. If applicable, a reminder letter will be sent to the patient regarding the next appointment. BI-RADS CATEGORY  5: Highly suggestive of malignancy. Electronically Signed   By: Lovey Newcomer M.D.   On: 08/07/2019 12:06   MS DIGITAL DIAG UNI LEFT  Addendum Date: 08/20/2019   ADDENDUM REPORT: 08/20/2019 09:06 ADDENDUM: PATHOLOGY revealed: A. BREAST, LEFT AT 1:00; ULTRASOUND-GUIDED CORE NEEDLE BIOPSY: - INVASIVE MAMMARY CARCINOMA, NO SPECIAL TYPE. At least 9 mm in this sample. Grade 2. Ductal carcinoma in situ: Not identified. Lymphovascular invasion: Not identified. B. AXILLA, LEFT; ULTRASOUND-GUIDED CORE NEEDLE BIOPSY: - INVASIVE MAMMARY CARCINOMA, NO SPECIAL TYPE. Comment: Invasive carcinoma within this specimen measures approximately 1 mm in greatest extent, and is histologically similar to tumor identified in part A.  No definitive lymph node tissue is identified. C. BREAST, LEFT UPPER OUTER QUADRANT; STEREOTACTIC CORE NEEDLE 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 CARCINOMA IN SITU, HIGH GRADE WITH COMEDONECROSIS, WITH ASSOCIATED CALCIFICATIONS. Comment: The specimen consists predominantly of ductal carcinoma in situ, present in 7 of 8 tissue blocks, and measuring up to 2 mm in greatest linear extent. DCIS is associated with calcifications. Invasive carcinoma is identified within a single block (C7), measuring 1 mm in greatest extent, and is histologically similar to tumor seen in parts A and B. Hormone receptor testing will be limited to specimen A. Pathology results are CONCORDANT with imaging findings, per Dr. Audie Pinto. Breast MRI is recommended for extent of disease. Multiple attempts to contact patient, using [employer], were unsuccessful. Notified Tanya Nones RN at [facility] (who is patient's Furniture conservator/restorer) and she will contact patient with biopsy results and arrange surgical referral. Addendum by Electa Sniff RN on 08/20/2019. Electronically Signed   By: Audie Pinto M.D.   On: 08/20/2019 09:06   Result Date: 08/20/2019 CLINICAL DATA:  39 year old female presenting for biopsy of a left breast mass, left breast calcifications and a left axillary lymph node. EXAM: DIAGNOSTIC LEFT MAMMOGRAM POST ULTRASOUND AND STEREOTACTIC BIOPSIES COMPARISON:  Previous exam(s). FINDINGS: Mammographic images were obtained following ultrasound guided biopsy of a left breast mass at 1 o'clock. The coil biopsy marking clip is in expected position at the site of biopsy. Mammographic images were obtained following ultrasound guided biopsy of a left axillary lymph node. The [facility] biopsy marking clip is in expected position at the site of biopsy. Mammographic images were obtained following stereotactic guided biopsy of calcifications. The X biopsy marking clip is in  expected position at the site of biopsy. IMPRESSION: Appropriate positioning of the coil shaped biopsy marking clip at the site of biopsy in the left breast 1 o'clock Appropriate positioning of the X shaped biopsy marking clip at the site of biopsy in the upper outer left breast. Appropriate positioning of the [facility] shaped biopsy marking clip at the site of biopsy in the left axilla. Final Assessment: Post Procedure Mammograms for Marker  Placement Electronically Signed: By: Audie Pinto M.D. On: 08/14/2019 09:36   DG Hip Unilat W or Wo Pelvis 2-3 Views Right  Result Date: 08/28/2019 CLINICAL DATA:  Right hip pain. History of metastatic breast cancer. EXAM: DG HIP (WITH OR WITHOUT PELVIS) 2-3V RIGHT COMPARISON:  None. FINDINGS: There is no evidence of hip fracture or dislocation. There is no evidence of arthropathy. A 4 mm round sclerotic focus is seen within the inter trochanteric region of the proximal right femur. IMPRESSION: Small sclerotic focus within the proximal right femur which may represent a small bone island. Given the patient's history of metastatic breast cancer, a small metastatic focus cannot be excluded. Electronically Signed   By: Virgina Norfolk M.D.   On: 08/28/2019 21:01   DG FEMUR, MIN 2 VIEWS RIGHT  Result Date: 09/01/2019 CLINICAL DATA:  Intramedullary nail insertion right femur secondary to metastatic disease and pinning fracture. EXAM: DG C-ARM 1-60 MIN; RIGHT FEMUR 2 VIEWS FLUOROSCOPY TIME:  Fluoroscopy Time:  3 minutes 45 seconds COMPARISON:  CT dated 08/27/2019 FINDINGS: C-arm images demonstrate insertion of intramedullary nail and proximal and distal screws in the right femur. The hardware appears in good position. Fracture. IMPRESSION: Satisfactory appearance of the right femur after intramedullary nail insertion. C-arm fluoroscopic images were obtained intraoperatively and submitted for post operative interpretation. Electronically Signed   By: Lorriane Shire M.D.    On: 09/01/2019 16:05   MM LT BREAST BX W LOC DEV 1ST LESION IMAGE BX SPEC STEREO GUIDE  Addendum Date: 08/20/2019   ADDENDUM REPORT: 08/20/2019 09:06 ADDENDUM: PATHOLOGY revealed: A. BREAST, LEFT AT 1:00; ULTRASOUND-GUIDED CORE NEEDLE BIOPSY: - INVASIVE MAMMARY CARCINOMA, NO SPECIAL TYPE. At least 9 mm in this sample. Grade 2. Ductal carcinoma in situ: Not identified. Lymphovascular invasion: Not identified. B. AXILLA, LEFT; ULTRASOUND-GUIDED CORE NEEDLE BIOPSY: - INVASIVE MAMMARY CARCINOMA, NO SPECIAL TYPE. Comment: Invasive carcinoma within this specimen measures approximately 1 mm in greatest extent, and is histologically similar to tumor identified in part A. No definitive lymph node tissue is identified. C. BREAST, LEFT UPPER OUTER QUADRANT; STEREOTACTIC CORE NEEDLE 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 CARCINOMA IN SITU, HIGH GRADE WITH COMEDONECROSIS, WITH ASSOCIATED CALCIFICATIONS. Comment: The specimen consists predominantly of ductal carcinoma in situ, present in 7 of 8 tissue blocks, and measuring up to 2 mm in greatest linear extent. DCIS is associated with calcifications. Invasive carcinoma is identified within a single block (C7), measuring 1 mm in greatest extent, and is histologically similar to tumor seen in parts A and B. Hormone receptor testing will be limited to specimen A. Pathology results are CONCORDANT with imaging findings, per Dr. Audie Pinto. Breast MRI is recommended for extent of disease. Multiple attempts to contact patient, using [employer], were unsuccessful. Notified Tanya Nones RN at [facility] (who is patient's Furniture conservator/restorer) and she will contact patient with biopsy results and arrange surgical referral. Addendum by Electa Sniff RN on 08/20/2019. Electronically Signed   By: Audie Pinto M.D.   On: 08/20/2019 09:06   Result Date: 08/20/2019 CLINICAL DATA:  39 year old female presenting for biopsy of a left  breast mass, left axillary lymph node and left breast calcifications. EXAM: LEFT BREAST STEREOTACTIC CORE NEEDLE BIOPSY COMPARISON:  Previous exams. FINDINGS: The patient and I discussed the procedure of stereotactic-guided biopsy including benefits and alternatives. We discussed the high likelihood of a successful procedure. We discussed the risks of the procedure including infection, bleeding, tissue injury, clip migration, and  inadequate sampling. Informed written consent was given. The usual time out protocol was performed immediately prior to the procedure. Using sterile technique and 1% Lidocaine as local anesthetic, under stereotactic guidance, a 9 gauge vacuum assisted device was used to perform core needle biopsy of calcifications in the upper outer left breast using a superior approach. Specimen radiograph was performed showing at least 4 specimens with calcifications. Specimens with calcifications are identified for pathology. Lesion quadrant: Upper outer quadrant At the conclusion of the procedure, an X tissue marker clip was deployed into the biopsy cavity. Follow-up 2-view mammogram was performed and dictated separately. IMPRESSION: Stereotactic-guided biopsy of calcifications in the upper outer left breast. No apparent complications. Electronically Signed: By: Audie Pinto M.D. On: 08/14/2019 09:43   [place] LT BREAST BX W LOC DEV 1ST LESION IMG BX SPEC US GUIDE  Addendum Date: 08/20/2019   ADDENDUM REPORT: 08/20/2019 09:04 ADDENDUM: PATHOLOGY revealed: A. BREAST, LEFT AT 1:00; ULTRASOUND-GUIDED CORE NEEDLE BIOPSY: - INVASIVE MAMMARY CARCINOMA, NO SPECIAL TYPE. At least 9 mm in this sample. Grade 2. Ductal carcinoma in situ: Not identified. Lymphovascular invasion: Not identified. B. AXILLA, LEFT; ULTRASOUND-GUIDED CORE NEEDLE BIOPSY: - INVASIVE MAMMARY CARCINOMA, NO SPECIAL TYPE. Comment: Invasive carcinoma within this specimen measures approximately 1 mm in greatest extent, and is histologically  similar to tumor identified in part A. No definitive lymph node tissue is identified. C. BREAST, LEFT UPPER OUTER QUADRANT; STEREOTACTIC CORE NEEDLE 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 CARCINOMA IN SITU, HIGH GRADE WITH COMEDONECROSIS, WITH ASSOCIATED CALCIFICATIONS. Comment: The specimen consists predominantly of ductal carcinoma in situ, present in 7 of 8 tissue blocks, and measuring up to 2 mm in greatest linear extent. DCIS is associated with calcifications. Invasive carcinoma is identified within a single block (C7), measuring 1 mm in greatest extent, and is histologically similar to tumor seen in parts A and B. Hormone receptor testing will be limited to specimen A. Pathology results are CONCORDANT with imaging findings, per Dr. Audie Pinto. Breast MRI is recommended for extent of disease. Multiple attempts to contact patient, using [employer], were unsuccessful. Notified Tanya Nones RN at [facility] (who is patient's Furniture conservator/restorer) and she will contact patient with biopsy results and arrange surgical referral. Addendum by Electa Sniff RN on 08/20/2019. Electronically Signed   By: Audie Pinto M.D.   On: 08/20/2019 09:04   Result Date: 08/20/2019 CLINICAL DATA:  39 year old female presenting for biopsy of a left breast mass, left axillary lymph node and left breast calcifications. EXAM: ULTRASOUND GUIDED LEFT BREAST CORE NEEDLE BIOPSY [place] AXILLARY NODE CORE BIOPSY LEFT COMPARISON:  Previous exam(s). PROCEDURE: I met with the patient and we discussed the procedure of ultrasound-guided biopsy, including benefits and alternatives. We discussed the high likelihood of a successful procedure. We discussed the risks of the procedure, including infection, bleeding, tissue injury, clip migration, and inadequate sampling. Informed written consent was given. The usual time-out protocol was performed immediately prior to the procedure. 1.  Lesion  quadrant: Upper outer quadrant Using sterile technique and 1% Lidocaine as local anesthetic, under direct ultrasound visualization, a 12 gauge spring-loaded device was used to perform biopsy of a mass in the left breast at 1 o'clock using a lateral approach. At the conclusion of the procedure a coil tissue marker clip was deployed into the biopsy cavity. Follow up 2 view mammogram was performed and dictated separately. 2.  Axilla Using sterile technique and 1% Lidocaine as local anesthetic, under direct  ultrasound visualization, a 14 gauge spring-loaded device was used to perform biopsy of a left axillary lymph node using a lateral approach. At the conclusion of the procedure a HydroMARK tissue marker clip was deployed into the biopsy cavity. Follow up 2 view mammogram was performed and dictated separately. IMPRESSION: Ultrasound guided biopsy of a left breast mass at 1 o'clock and a left axillary lymph node. No apparent complications. Electronically Signed: By: Audie Pinto M.D. On: 08/14/2019 09:42   [place] LT BREAST BX W LOC DEV EA ADD LESION IMG BX SPEC US GUIDE  Addendum Date: 08/20/2019   ADDENDUM REPORT: 08/20/2019 09:04 ADDENDUM: PATHOLOGY revealed: A. BREAST, LEFT AT 1:00; ULTRASOUND-GUIDED CORE NEEDLE BIOPSY: - INVASIVE MAMMARY CARCINOMA, NO SPECIAL TYPE. At least 9 mm in this sample. Grade 2. Ductal carcinoma in situ: Not identified. Lymphovascular invasion: Not identified. B. AXILLA, LEFT; ULTRASOUND-GUIDED CORE NEEDLE BIOPSY: - INVASIVE MAMMARY CARCINOMA, NO SPECIAL TYPE. Comment: Invasive carcinoma within this specimen measures approximately 1 mm in greatest extent, and is histologically similar to tumor identified in part A. No definitive lymph node tissue is identified. C. BREAST, LEFT UPPER OUTER QUADRANT; STEREOTACTIC CORE NEEDLE 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 CARCINOMA IN SITU, HIGH GRADE WITH COMEDONECROSIS, WITH ASSOCIATED CALCIFICATIONS. Comment: The  specimen consists predominantly of ductal carcinoma in situ, present in 7 of 8 tissue blocks, and measuring up to 2 mm in greatest linear extent. DCIS is associated with calcifications. Invasive carcinoma is identified within a single block (C7), measuring 1 mm in greatest extent, and is histologically similar to tumor seen in parts A and B. Hormone receptor testing will be limited to specimen A. Pathology results are CONCORDANT with imaging findings, per Dr. Audie Pinto. Breast MRI is recommended for extent of disease. Multiple attempts to contact patient, using [employer], were unsuccessful. Notified Tanya Nones RN at [facility] (who is patient's Furniture conservator/restorer) and she will contact patient with biopsy results and arrange surgical referral. Addendum by Electa Sniff RN on 08/20/2019. Electronically Signed   By: Audie Pinto M.D.   On: 08/20/2019 09:04   Result Date: 08/20/2019 CLINICAL DATA:  39 year old female presenting for biopsy of a left breast mass, left axillary lymph node and left breast calcifications. EXAM: ULTRASOUND GUIDED LEFT BREAST CORE NEEDLE BIOPSY [place] AXILLARY NODE CORE BIOPSY LEFT COMPARISON:  Previous exam(s). PROCEDURE: I met with the patient and we discussed the procedure of ultrasound-guided biopsy, including benefits and alternatives. We discussed the high likelihood of a successful procedure. We discussed the risks of the procedure, including infection, bleeding, tissue injury, clip migration, and inadequate sampling. Informed written consent was given. The usual time-out protocol was performed immediately prior to the procedure. 1.  Lesion quadrant: Upper outer quadrant Using sterile technique and 1% Lidocaine as local anesthetic, under direct ultrasound visualization, a 12 gauge spring-loaded device was used to perform biopsy of a mass in the left breast at 1 o'clock using a lateral approach. At the conclusion of the procedure a coil tissue  marker clip was deployed into the biopsy cavity. Follow up 2 view mammogram was performed and dictated separately. 2.  Axilla Using sterile technique and 1% Lidocaine as local anesthetic, under direct ultrasound visualization, a 14 gauge spring-loaded device was used to perform biopsy of a left axillary lymph node using a lateral approach. At the conclusion of the procedure a HydroMARK tissue marker clip was deployed into the biopsy cavity. Follow up 2 view mammogram was performed and  dictated separately. IMPRESSION: Ultrasound guided biopsy of a left breast mass at 1 o'clock and a left axillary lymph node. No apparent complications. Electronically Signed: By: Audie Pinto M.D. On: 08/14/2019 09:42   MM Outside Films Mammo  Result Date: 08/08/2019 This examination belongs to an outside facility and is stored here for comparison purposes only.  Contact the originating outside institution for any associated report or interpretation.    Assessment and plan- Patient is a 39 y.o. 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  Bone metastases: S/p kyphoplasty at 3 different spinal levels and prophylactic fixation of right hip.  She is going to be seeing radiation oncology as an outpatient.  Please discontinue Decadron and discharge her on oxycodone and OxyContin for pain control and I will continue to adjust her pain medications as an outpatient.  She has an appointment with me as an outpatient next week.  We will also consider starting her on bisphosphonates at that time  2.  Metastatic breast cancer: She will get first dose of Herceptin and Perjeta today and she will continue to get that every 3 weeks.  She will be receiving Taxol weekly but I will plan to hold chemotherapy during radiation   Visit Diagnosis 1. Metastatic malignant neoplasm, unspecified site [facility])   2. Surgery, elective   3. Multiple pathological fractures      Dr.  Randa Evens, MD, MPH [facility] [phone] 09/04/2019 4:12 PM

## 2019-09-04 NOTE — Discharge Summary (Signed)
Physician Discharge Summary  Sophia [name] VPX:[id number] DOB: 06-05-1980 DOA: 08/28/2019  PCP: [facility], [place], NP  Admit date: 08/28/2019 Discharge date: 09/04/2019 Consultations: Orthopedics, Oncology Admitted From: home Disposition: home  Discharge Diagnoses:  Principal Problem:   Multiple pathological fractures Active Problems:   Primary cancer of left breast with metastasis to other site [facility])   Anxiety   Preoperative clearance   Leukocytosis   Metastatic breast cancer ([place])   Goals of care, counseling/discussion   Neoplasm related pain   Hospital Course Summary: Admit date:08/28/2019 39 y.o.femalerecently diagnosed with 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  Patientalsohad port placement by Dr.Dewearlier that day. ED Course: Afebrile, WBC 16K Hospital course: [facility] and [facility]. Kyphoplasty with ablation scheduled after EDP discussed withoncologist, Dr. Janese Banks, neurosurgeon Dr. Cari Caraway and orthopedist Dr. Hardie Pulley patient not felt to be agood candidate for instrumented fusion.  Patient also planned for prophylactic fixation of the right hip as well as radiofrequency ablation of the sacral metastatic region as inpatient.  Back pain/pathological vertebral compression fractures: Underwent radiofrequency ablation of tumor with kyphoplasty at fractured levels of T6,L1 and L4. Patient recieved supportive management with IV fluids, pain medications, decadron and anxiolytics. Patient encouraged to utilize oral pain medications to determine home regimen-she did not tolerate fentanyl  patch well and had dizziness.  Transition now to OxyContin twice daily with oxycodone IR for as needed use for breakthrough pain.  She has not required IV morphine since yesterday morning.  Seen by orthopedics who recommended TED hose and Lovenox for DVT prophylaxis  for atleast 2 weeks--requested nurse to educate/train patient regarding self administration.  Right sacrum and ilium tumor:Seen by Orthopedics and went to OR 6/14 for radiofrequency ablation and cement augmentation of sacrum/ iliac crest lesions.  Seen by PT and was able to walk with Rollator walker.  Right proximal femur lesion : Underwent surgery on the right hip with prophylactic rodding with radiofrequency ablation to the tumor prior to placement of metal rod and screws.  Weightbearing as tolerated to right leg advised, patient seen by PT and home health, DME including rolling walker, 3 in 1 ordered.  Pain medication regimen as discussed above.  Surgical incision site to be kept clean and dry as advised by orthopedics.  Metastatic breast cancer:ER/PR negative HER-2 positive.  Appreciate oncology input and follow-up.  They plan to give chemotherapy before discharge-Dr. Janese Banks will adjust pain medications for symptom management upon discharge  Left acetabular lesion, does not appear symptomatic  Leukocytosis, steroid-induced.  DCed steroids 6/15 per Oncology and white count downtrending  Transaminitis: Unclear etiology. now resolved, hepatitis screen negative. Liver okay on PET scan   Discharge Exam:   Vitals:   09/03/19 2007 09/04/19 0042 09/04/19 0514 09/04/19 0748  BP: 109/68 117/70 120/68 118/75  Pulse: (!) 108 (!) 102 88 (!) 109  Resp: '14 14 14 18  '$ Temp: 99 F (37.2 C) 98.8 F (37.1 C) 98.7 F (37.1 C) 99 F (37.2 C)  TempSrc: Oral  Oral Oral  SpO2: 98% 96%  95%  Weight:      Height:        General: Pt is alert, awake, not in  acute distress Cardiovascular: RRR, S1/S2 +, no rubs, no gallops Respiratory: CTA  bilaterally, no wheezing, no rhonchi Abdominal: Soft, NT, ND, bowel sounds + Extremities: no edema, no cyanosis  Discharge Condition:Stable CODE STATUS: Diet recommendation: Recommendations for Outpatient Follow-up:  1. Follow up with PCP:  2. Follow up with consultants:  3. Please obtain follow up labs including:   Home Health services upon discharge:  Equipment/Devices upon discharge:   Discharge Instructions:  Discharge Instructions    Call MD for:  difficulty breathing, headache or visual disturbances   Complete by: As directed    Call MD for:  extreme fatigue   Complete by: As directed    Call MD for:  persistant dizziness or light-headedness   Complete by: As directed    Call MD for:  persistant nausea and vomiting   Complete by: As directed    Call MD for:  severe uncontrolled pain   Complete by: As directed    Call MD for:  temperature >100.4   Complete by: As directed    Diet - low sodium heart healthy   Complete by: As directed    Discharge wound care:   Complete by: As directed    Reinforce dressing   Increase activity slowly   Complete by: As directed    Leave dressing on - Keep it clean, dry, and intact until clinic visit   Complete by: As directed      Allergies as of 09/04/2019   No Known Allergies     Medication List    STOP taking these medications   dexamethasone 4 MG tablet Commonly known as: DECADRON     TAKE these medications   alum & mag hydroxide-simeth 200-200-20 MG/5ML suspension Commonly known as: MAALOX/MYLANTA Take 30 mLs by mouth every 4 (four) hours as needed for indigestion.   bisacodyl 10 MG suppository Commonly known as: DULCOLAX Place 1 suppository (10 mg total) rectally daily as needed for moderate constipation.   cyclobenzaprine 10 MG tablet Commonly known as: FLEXERIL Take 1 tablet (10 mg total) by mouth 3 (three) times daily as needed.   docusate sodium 100 MG capsule Commonly known as: COLACE Take 1 capsule (100  mg total) by mouth 2 (two) times daily.   enoxaparin 40 MG/0.4ML injection Commonly known as: LOVENOX Inject 0.4 mLs (40 mg total) into the skin daily for 14 days. Start taking on: September 05, 2019   gabapentin 300 MG capsule Commonly known as: NEURONTIN Take 1 capsule (300 mg total) by mouth 2 (two) times daily.   hydrOXYzine 10 MG tablet Commonly known as: ATARAX/VISTARIL Take 1 tablet (10 mg total) by mouth 3 (three) times daily as needed for anxiety.   LORazepam 0.5 MG tablet Commonly known as: ATIVAN Take 1 tablet (0.5 mg total) by mouth every 6 (six) hours as needed for anxiety.   meclizine 25 MG tablet Commonly known as: ANTIVERT Take 1 tablet (25 mg total) by mouth 3 (three) times daily as needed for dizziness.   naphazoline-pheniramine 0.025-0.3 % ophthalmic solution Commonly known as: NAPHCON-A 1 drop Four (4) times a day.   ondansetron 4 MG tablet Commonly known as: Zofran Take 1 tablet (4 mg total) by mouth every 6 (six) hours as needed for nausea or vomiting.   oxyCODONE 5 MG immediate release tablet Commonly known as: Oxy IR/ROXICODONE Take 1-2 tablets (5-10 mg total) by mouth every 4 (four) hours as needed for moderate pain. What changed:   how much to take  reasons to  take this   senna-docusate 8.6-50 MG tablet Commonly known as: Senokot-S Take 1 tablet by mouth 2 (two) times daily.            Durable Medical Equipment  (From admission, onward)         Start     Ordered   09/03/19 1213  For home use only DME 3 n 1  Once        09/03/19 1212   09/02/19 1414  For home use only DME Walker youth  Once       Question:  Patient needs a walker to treat with the following condition  Answer:  Hip fracture ([place])   09/02/19 1413           Discharge Care Instructions  (From admission, onward)         Start     Ordered   09/04/19 0000  Discharge wound care:       Comments: Reinforce dressing   09/04/19 1231   09/04/19 0000  Leave dressing on -  Keep it clean, dry, and intact until clinic visit        09/04/19 [place], [place], NP Follow up.   Contact information: [place] [address] [phone]        Duanne Guess, PA-C Follow up in 2 week(s).   Specialties: Orthopedic Surgery, Emergency Medicine Contact information: [place] [address] [phone]              No Known Allergies    The results of significant diagnostics from this hospitalization (including imaging, microbiology, ancillary and laboratory) are listed below for reference.    Labs: BNP (last 3 results) No results for input(s): BNP in the last 8760 hours. Basic Metabolic Panel: Recent Labs  Lab 08/28/19 2044 08/28/19 2044 08/30/19 0614 08/31/19 0730 09/01/19 0653 09/01/19 1754 09/02/19 0556  NA 133*  --  136 136 137  --  136  K 3.8  --  4.0 3.5 3.8  --  3.9  CL 101  --  102 103 103  --  103  CO2 24  --  '24 25 26  '$ --  25  GLUCOSE 130*  --  123* 92 101*  --  141*  BUN 21*  --  '12 11 12  '$ --  13  CREATININE 0.61   < > 0.53 0.40* 0.38* 0.71 0.47  CALCIUM 8.8*  --  8.6* 8.3* 8.6*  --  8.0*  MG  --   --   --  2.0  --   --   --    < > = values in this interval not displayed.   Liver Function Tests: Recent Labs  Lab 08/28/19 2044 08/30/19 0614  AST 46* 21  ALT 68* 39  ALKPHOS 99 100  BILITOT 0.6 0.5  PROT 7.1 6.1*  ALBUMIN 3.4* 3.0*   No results for input(s): LIPASE, AMYLASE in the last 168 hours. No results for input(s): AMMONIA in the last 168 hours. CBC: Recent Labs  Lab 08/28/19 2044 08/28/19 2044 08/29/19 1857 08/31/19 0443 09/01/19 0653 09/01/19 1754 09/02/19 0556  WBC 16.7*   < > 27.6* 10.3 21.3* 37.6* 23.5*  NEUTROABS 15.0*  --  23.6*  --  16.4*  --   --   HGB 14.3   < > 14.6 9.2* 13.4 13.4 11.4*  HCT 41.2   < >  42.7 29.5* 39.2 39.9 33.4*  MCV 84.4   < > 88.0 91.6 86.7 87.7 87.9  PLT 446*   < > 420* 175 389  341 290   < > = values in this interval not displayed.   Cardiac Enzymes: No results for input(s): CKTOTAL, CKMB, CKMBINDEX, TROPONINI in the last 168 hours. BNP: Invalid input(s): POCBNP CBG: Recent Labs  Lab 09/04/19 0750  GLUCAP 83   D-Dimer No results for input(s): DDIMER in the last 72 hours. Hgb A1c No results for input(s): HGBA1C in the last 72 hours. Lipid Profile No results for input(s): CHOL, HDL, LDLCALC, TRIG, CHOLHDL, LDLDIRECT in the last 72 hours. Thyroid function studies No results for input(s): TSH, T4TOTAL, T3FREE, THYROIDAB in the last 72 hours.  Invalid input(s): FREET3 Anemia work up No results for input(s): VITAMINB12, FOLATE, FERRITIN, TIBC, IRON, RETICCTPCT in the last 72 hours. Urinalysis    Component Value Date/Time   COLORURINE STRAW (A) 08/28/2019 2044   APPEARANCEUR CLEAR (A) 08/28/2019 2044   LABSPEC 1.008 08/28/2019 2044   PHURINE 6.0 08/28/2019 2044   GLUCOSEU NEGATIVE 08/28/2019 2044   HGBUR NEGATIVE 08/28/2019 2044   BILIRUBINUR NEGATIVE 08/28/2019 2044   KETONESUR NEGATIVE 08/28/2019 2044   PROTEINUR NEGATIVE 08/28/2019 2044   NITRITE NEGATIVE 08/28/2019 2044   LEUKOCYTESUR TRACE (A) 08/28/2019 2044   Sepsis Labs Invalid input(s): PROCALCITONIN,  WBC,  LACTICIDVEN Microbiology Recent Results (from the past 240 hour(s))  SARS CORONAVIRUS 2 (TAT 6-24 HRS) Nasopharyngeal Nasopharyngeal Swab     Status: None   Collection Time: 08/26/19  1:27 PM   Specimen: Nasopharyngeal Swab  Result Value Ref Range Status   SARS Coronavirus 2 NEGATIVE NEGATIVE Final    Comment: (NOTE) SARS-CoV-2 target nucleic acids are NOT DETECTED. The SARS-CoV-2 RNA is generally detectable in upper and lower respiratory specimens during the acute phase of infection. Negative results do not preclude SARS-CoV-2 infection, do not rule out co-infections with other pathogens, and should not be used as the sole basis for treatment or other patient management  decisions. Negative results must be combined with clinical observations, patient history, and epidemiological information. The expected result is Negative. Fact Sheet for Patients: SugarRoll.be Fact Sheet for Healthcare Providers: https://www.woods-mathews.com/ This test is not yet approved or cleared by the [place] FDA and  has been authorized for detection and/or diagnosis of SARS-CoV-2 by FDA under an Emergency Use Authorization (EUA). This EUA will remain  in effect (meaning this test can be used) for the duration of the COVID-19 declaration under Section 56 4(b)(1) of the Act, 21 U.S.C. section 360bbb-3(b)(1), unless the authorization is terminated or revoked sooner. Performed at [facility], [place] [address] [place], [place] [address]   SARS Coronavirus 2 by RT PCR (hospital order, performed in [facility] hospital lab) Nasopharyngeal Nasopharyngeal Swab     Status: None   Collection Time: 08/29/19  4:12 AM   Specimen: Nasopharyngeal Swab  Result Value Ref Range Status   SARS Coronavirus 2 NEGATIVE NEGATIVE Final    Comment: (NOTE) SARS-CoV-2 target nucleic acids are NOT DETECTED.  The SARS-CoV-2 RNA is generally detectable in upper and lower respiratory specimens during the acute phase of infection. The lowest concentration of SARS-CoV-2 viral copies this assay can detect is 250 copies / mL. A negative result does not preclude SARS-CoV-2 infection and should not be used as the sole basis for treatment or other patient management decisions.  A negative result may occur with improper specimen collection / handling,  submission of specimen other than nasopharyngeal swab, presence of viral mutation(s) within the areas targeted by this assay, and inadequate number of viral copies (<250 copies / mL). A negative result must be combined with clinical observations, patient history, and epidemiological information.  Fact Sheet for  Patients:   StrictlyIdeas.no  Fact Sheet for Healthcare Providers: BankingDealers.co.za  This test is not yet approved or  cleared by the [place] FDA and has been authorized for detection and/or diagnosis of SARS-CoV-2 by FDA under an Emergency Use Authorization (EUA).  This EUA will remain in effect (meaning this test can be used) for the duration of the COVID-19 declaration under Section 564(b)(1) of the Act, 21 U.S.C. section 360bbb-3(b)(1), unless the authorization is terminated or revoked sooner.  Performed at [facility], [address] [place], [place] [address]     Procedures/Studies: DG Thoracic Spine 2 View  Result Date: 08/29/2019 CLINICAL DATA:  Vertebral augmentation. EXAM: THORACIC SPINE 2 VIEWS; DG C-ARM 1-60 MIN COMPARISON:  MRI 08/25/2019 FINDINGS: Multiple C-arm images are provided. In the lumbar region, vertebral augmentation at L1 has a good appearance. Vertebral augmentation at the L4 level shows methylmethacrylate throughout the vertebral body, and also apparently based on this 1 projection a large amount within the spinal canal, measuring up to 9 mm in thickness and extending from the superior aspect of L4 down to the superior aspect of L5. This could result in compressive stenosis. Thoracic region imaging shows methylmethacrylate well distributed within the T6 vertebral body. There is probably some extension into the disc space. IMPRESSION: Vertebral augmentation at T6, L1 and L4. At L4, there appears to be ventral epidural cement measuring up to 9 mm in thickness. See above. Electronically Signed   By: Nelson Chimes M.D.   On: 08/29/2019 15:08   DG Sacrum/Coccyx  Result Date: 09/01/2019 CLINICAL DATA:  Metastatic disease to the sacrum and pelvic bones. EXAM: DG C-ARM 1-60 MIN; SACRUM AND COCCYX - 2+ VIEW FLUOROSCOPY TIME:  Fluoroscopy Time:  3 minutes 45 seconds COMPARISON:  PET-CT dated 08/27/2019  FINDINGS: AP and lateral C-arm images demonstrate contrast in the right sacral ala and adjacent right iliac bone which correlates with metastatic lesions visible on PET-CT. IMPRESSION: Cement augmentation of the right ilium and right sacral ala after radiofrequency ablation of metastatic disease. C-arm fluoroscopic images were obtained intraoperatively and submitted for post operative interpretation. Electronically Signed   By: Lorriane Shire M.D.   On: 09/01/2019 16:02   MR Brain W and Wo Contrast  Result Date: 08/29/2019 CLINICAL DATA:  Metastatic breast cancer. EXAM: MRI HEAD WITHOUT AND WITH CONTRAST TECHNIQUE: Multiplanar, multiecho pulse sequences of the brain and surrounding structures were obtained without and with intravenous contrast. CONTRAST:  16m GADAVIST GADOBUTROL 1 MMOL/ML IV SOLN COMPARISON:  None. FINDINGS: Brain: There is no acute infarct, acute hemorrhage or extra-axial collection. Normal white matter signal. CSF spaces are normal. No contrast-enhancing lesions. No chronic microhemorrhage. Midline structures are normal. Vascular: Normal flow voids Skull and upper cervical spine: Normal bone marrow signal. Sinuses/Orbits: Paranasal sinuses and mastoids are clear. Normal orbits. Other: None IMPRESSION: Normal brain MRI. No evidence of intracranial metastatic disease. Electronically Signed   By: KUlyses JarredM.D.   On: 08/29/2019 02:07   MR Cervical Spine W Wo Contrast  Result Date: 08/25/2019 CLINICAL DATA:  Back pain with bilateral lower extremity radiculopathy. Breast cancer. Reported history of abnormal lumbar spine MRI EXAM: MRI CERVICAL, THORACIC AND LUMBAR SPINE WITHOUT AND WITH CONTRAST TECHNIQUE: Multiplanar  and multiecho pulse sequences of the cervical spine, to include the craniocervical junction and cervicothoracic junction, and thoracic and lumbar spine, were obtained without and with intravenous contrast. CONTRAST:  6 mL Gadavist IV contrast COMPARISON:  No comparison studies  are available at the time of dictation FINDINGS: MRI CERVICAL SPINE FINDINGS Alignment: No static listhesis. Straightening of the cervical lordosis. Vertebrae: Rounded enhancing 9 mm lesion within the mid C2 vertebral body. Additional enhancing lesion centered within the left posterior aspect of the C5 vertebral body measuring up to 10 mm. No enhancing soft tissue mass. No evidence of epidural involvement or extension. Cord: Normal signal and morphology. No cord expansion or focal cord lesion. Posterior Fossa, vertebral arteries, paraspinal tissues: Enhancing marrow replacing lesion involving the left occipital condyle measuring 1.8 cm (series 6, image 14). No abnormality within the visualized posterior fossa. Paraspinal soft tissues are within normal limits. Disc levels: No significant disc protrusion, foraminal stenosis, or canal stenosis at any level. Cervical intervertebral discs remain well hydrated with preservation of disc height. Normal cervical facet joints. MRI THORACIC SPINE FINDINGS Alignment: No static listhesis. 4 mm of bony retropulsion related to pathologic fracture of the T6 vertebrae. Vertebrae: Rounded 7 mm enhancing lesion within the anterior aspect of the T4 vertebral body. Small 2-3 mm lesion within the T5 vertebral body. Diffuse metastatic involvement of the T6 level with acute pathologic fracture where there is approximately 50% vertebral body height loss and 4 mm of bony retropulsion. Small 2-3 mm enhancing lesion near the right T8 pedicle. 9 mm enhancing lesion near the left T9 pedicle. 7 mm enhancing metastatic lesion in the anterior aspect of the T10 vertebral body. Multiple enhancing foci within the T12 vertebral body where there is a chronic appearing mild superior endplate compression fracture without residual marrow edema. No enhancing soft tissue mass. No evidence of epidural involvement or extension. There is also a enhancing expansile rib lesion involving the posterior right third  rib (series 25, image 7. Rib lesion measures approximately 2.9 x 1.3 cm. Suspect metastatic lesion of the manubrium (series 26, image 12). Cord: Normal signal and morphology. No cord expansion or focal cord lesion. Paraspinal and other soft tissues: No acute findings. Disc levels: Thoracic vertebral body heights are maintained with well-hydrated discs. There is no degenerative disc or facet arthropathy of the lumbar spine. No disc protrusion. Bony retropulsion at T6 contacts the ventral cord without canal stenosis. MRI LUMBAR SPINE FINDINGS Segmentation:  Standard. Alignment: No static listhesis. 4 mm of bony retropulsion related to pathologic fracture of the L1 vertebral body. Vertebrae: Diffuse metastatic lesion throughout the L1 vertebral body with acute pathologic compression fracture with approximately 75% vertebral body height loss centrally and 4 mm of bony retropulsion. Enhancing 9 mm lesion at the superior endplate and right pedicle of L2. A few tiny enhancing foci are evident within the L3 and L5 vertebral bodies. Large enhancing lesion throughout the L4 vertebral body with acute pathologic fracture and approximately 60% vertebral body height loss centrally. Additional areas of enhancing metastatic disease are seen throughout the sacrum, most predominantly involving the lateral right sacral ala (series 6, image 3) rounded enhancing metastatic lesion within the right ilium adjacent to the right SI joint (series 7, image 35). Conus medullaris and cauda equina: Conus extends to the L1 level. Conus and cauda equina appear unremarkable without abnormal enhancement. Paraspinal and other soft tissues: Negative. Disc levels: T12-L1: No significant disc protrusion, foraminal stenosis, or canal stenosis. Bony retropulsion of the L1 vertebral  body results in mild canal stenosis. L1-L2: No significant disc protrusion, foraminal stenosis, or canal stenosis. L2-L3: No significant disc protrusion, foraminal stenosis, or  canal stenosis. L3-L4: Minimal disc bulge without foraminal or canal stenosis. L4-L5: Mild disc bulge, eccentric to the left without evidence of foraminal or canal stenosis. L5-S1: Diffuse disc bulge with a superimposed small central disc protrusion contacting the ventral thecal sac without canal stenosis. Bilateral foramina remain patent. IMPRESSION: 1. 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 There is acute pathologic compression fractures of T6, L1, and L4, as described above. Mild canal stenosis related to retropulsion of the L1 vertebral body. There is no high-grade foraminal or canal stenosis at any spinal level. 2. No evidence of extraosseous spinal soft tissue mass or epidural involvement. 3. Expansile posterior right third rib metastatic lesion. 4. Additional metastatic lesions are seen within the left occipital condyle, manubrium, sacrum, and posterior right ilium. These results will be called to the ordering clinician or representative by the Radiologist Assistant, and communication documented in the PACS or [employer]. Electronically Signed   By: Davina Poke D.O.   On: 08/25/2019 15:25   MR Thoracic Spine W Wo Contrast  Result Date: 08/25/2019 CLINICAL DATA:  Back pain with bilateral lower extremity radiculopathy. Breast cancer. Reported history of abnormal lumbar spine MRI EXAM: MRI CERVICAL, THORACIC AND LUMBAR SPINE WITHOUT AND WITH CONTRAST TECHNIQUE: Multiplanar and multiecho pulse sequences of the cervical spine, to include the craniocervical junction and cervicothoracic junction, and thoracic and lumbar spine, were obtained without and with intravenous contrast. CONTRAST:  6 mL Gadavist IV contrast COMPARISON:  No comparison studies are available at the time of dictation FINDINGS: MRI CERVICAL SPINE FINDINGS Alignment: No static listhesis. Straightening of the cervical lordosis. Vertebrae: Rounded enhancing 9 mm lesion within the mid  C2 vertebral body. Additional enhancing lesion centered within the left posterior aspect of the C5 vertebral body measuring up to 10 mm. No enhancing soft tissue mass. No evidence of epidural involvement or extension. Cord: Normal signal and morphology. No cord expansion or focal cord lesion. Posterior Fossa, vertebral arteries, paraspinal tissues: Enhancing marrow replacing lesion involving the left occipital condyle measuring 1.8 cm (series 6, image 14). No abnormality within the visualized posterior fossa. Paraspinal soft tissues are within normal limits. Disc levels: No significant disc protrusion, foraminal stenosis, or canal stenosis at any level. Cervical intervertebral discs remain well hydrated with preservation of disc height. Normal cervical facet joints. MRI THORACIC SPINE FINDINGS Alignment: No static listhesis. 4 mm of bony retropulsion related to pathologic fracture of the T6 vertebrae. Vertebrae: Rounded 7 mm enhancing lesion within the anterior aspect of the T4 vertebral body. Small 2-3 mm lesion within the T5 vertebral body. Diffuse metastatic involvement of the T6 level with acute pathologic fracture where there is approximately 50% vertebral body height loss and 4 mm of bony retropulsion. Small 2-3 mm enhancing lesion near the right T8 pedicle. 9 mm enhancing lesion near the left T9 pedicle. 7 mm enhancing metastatic lesion in the anterior aspect of the T10 vertebral body. Multiple enhancing foci within the T12 vertebral body where there is a chronic appearing mild superior endplate compression fracture without residual marrow edema. No enhancing soft tissue mass. No evidence of epidural involvement or extension. There is also a enhancing expansile rib lesion involving the posterior right third rib (series 25, image 7. Rib lesion measures approximately 2.9 x 1.3 cm. Suspect metastatic lesion of the manubrium (series 26, image 12).  Cord: Normal signal and morphology. No cord expansion or focal  cord lesion. Paraspinal and other soft tissues: No acute findings. Disc levels: Thoracic vertebral body heights are maintained with well-hydrated discs. There is no degenerative disc or facet arthropathy of the lumbar spine. No disc protrusion. Bony retropulsion at T6 contacts the ventral cord without canal stenosis. MRI LUMBAR SPINE FINDINGS Segmentation:  Standard. Alignment: No static listhesis. 4 mm of bony retropulsion related to pathologic fracture of the L1 vertebral body. Vertebrae: Diffuse metastatic lesion throughout the L1 vertebral body with acute pathologic compression fracture with approximately 75% vertebral body height loss centrally and 4 mm of bony retropulsion. Enhancing 9 mm lesion at the superior endplate and right pedicle of L2. A few tiny enhancing foci are evident within the L3 and L5 vertebral bodies. Large enhancing lesion throughout the L4 vertebral body with acute pathologic fracture and approximately 60% vertebral body height loss centrally. Additional areas of enhancing metastatic disease are seen throughout the sacrum, most predominantly involving the lateral right sacral ala (series 6, image 3) rounded enhancing metastatic lesion within the right ilium adjacent to the right SI joint (series 7, image 35). Conus medullaris and cauda equina: Conus extends to the L1 level. Conus and cauda equina appear unremarkable without abnormal enhancement. Paraspinal and other soft tissues: Negative. Disc levels: T12-L1: No significant disc protrusion, foraminal stenosis, or canal stenosis. Bony retropulsion of the L1 vertebral body results in mild canal stenosis. L1-L2: No significant disc protrusion, foraminal stenosis, or canal stenosis. L2-L3: No significant disc protrusion, foraminal stenosis, or canal stenosis. L3-L4: Minimal disc bulge without foraminal or canal stenosis. L4-L5: Mild disc bulge, eccentric to the left without evidence of foraminal or canal stenosis. L5-S1: Diffuse disc bulge  with a superimposed small central disc protrusion contacting the ventral thecal sac without canal stenosis. Bilateral foramina remain patent. IMPRESSION: 1. 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 There is acute pathologic compression fractures of T6, L1, and L4, as described above. Mild canal stenosis related to retropulsion of the L1 vertebral body. There is no high-grade foraminal or canal stenosis at any spinal level. 2. No evidence of extraosseous spinal soft tissue mass or epidural involvement. 3. Expansile posterior right third rib metastatic lesion. 4. Additional metastatic lesions are seen within the left occipital condyle, manubrium, sacrum, and posterior right ilium. These results will be called to the ordering clinician or representative by the Radiologist Assistant, and communication documented in the PACS or [employer]. Electronically Signed   By: Davina Poke D.O.   On: 08/25/2019 15:25   MR Lumbar Spine W Wo Contrast  Result Date: 08/25/2019 CLINICAL DATA:  Back pain with bilateral lower extremity radiculopathy. Breast cancer. Reported history of abnormal lumbar spine MRI EXAM: MRI CERVICAL, THORACIC AND LUMBAR SPINE WITHOUT AND WITH CONTRAST TECHNIQUE: Multiplanar and multiecho pulse sequences of the cervical spine, to include the craniocervical junction and cervicothoracic junction, and thoracic and lumbar spine, were obtained without and with intravenous contrast. CONTRAST:  6 mL Gadavist IV contrast COMPARISON:  No comparison studies are available at the time of dictation FINDINGS: MRI CERVICAL SPINE FINDINGS Alignment: No static listhesis. Straightening of the cervical lordosis. Vertebrae: Rounded enhancing 9 mm lesion within the mid C2 vertebral body. Additional enhancing lesion centered within the left posterior aspect of the C5 vertebral body measuring up to 10 mm. No enhancing soft tissue mass. No evidence of epidural  involvement or extension. Cord: Normal signal and morphology. No cord expansion or  focal cord lesion. Posterior Fossa, vertebral arteries, paraspinal tissues: Enhancing marrow replacing lesion involving the left occipital condyle measuring 1.8 cm (series 6, image 14). No abnormality within the visualized posterior fossa. Paraspinal soft tissues are within normal limits. Disc levels: No significant disc protrusion, foraminal stenosis, or canal stenosis at any level. Cervical intervertebral discs remain well hydrated with preservation of disc height. Normal cervical facet joints. MRI THORACIC SPINE FINDINGS Alignment: No static listhesis. 4 mm of bony retropulsion related to pathologic fracture of the T6 vertebrae. Vertebrae: Rounded 7 mm enhancing lesion within the anterior aspect of the T4 vertebral body. Small 2-3 mm lesion within the T5 vertebral body. Diffuse metastatic involvement of the T6 level with acute pathologic fracture where there is approximately 50% vertebral body height loss and 4 mm of bony retropulsion. Small 2-3 mm enhancing lesion near the right T8 pedicle. 9 mm enhancing lesion near the left T9 pedicle. 7 mm enhancing metastatic lesion in the anterior aspect of the T10 vertebral body. Multiple enhancing foci within the T12 vertebral body where there is a chronic appearing mild superior endplate compression fracture without residual marrow edema. No enhancing soft tissue mass. No evidence of epidural involvement or extension. There is also a enhancing expansile rib lesion involving the posterior right third rib (series 25, image 7. Rib lesion measures approximately 2.9 x 1.3 cm. Suspect metastatic lesion of the manubrium (series 26, image 12). Cord: Normal signal and morphology. No cord expansion or focal cord lesion. Paraspinal and other soft tissues: No acute findings. Disc levels: Thoracic vertebral body heights are maintained with well-hydrated discs. There is no degenerative disc or facet  arthropathy of the lumbar spine. No disc protrusion. Bony retropulsion at T6 contacts the ventral cord without canal stenosis. MRI LUMBAR SPINE FINDINGS Segmentation:  Standard. Alignment: No static listhesis. 4 mm of bony retropulsion related to pathologic fracture of the L1 vertebral body. Vertebrae: Diffuse metastatic lesion throughout the L1 vertebral body with acute pathologic compression fracture with approximately 75% vertebral body height loss centrally and 4 mm of bony retropulsion. Enhancing 9 mm lesion at the superior endplate and right pedicle of L2. A few tiny enhancing foci are evident within the L3 and L5 vertebral bodies. Large enhancing lesion throughout the L4 vertebral body with acute pathologic fracture and approximately 60% vertebral body height loss centrally. Additional areas of enhancing metastatic disease are seen throughout the sacrum, most predominantly involving the lateral right sacral ala (series 6, image 3) rounded enhancing metastatic lesion within the right ilium adjacent to the right SI joint (series 7, image 35). Conus medullaris and cauda equina: Conus extends to the L1 level. Conus and cauda equina appear unremarkable without abnormal enhancement. Paraspinal and other soft tissues: Negative. Disc levels: T12-L1: No significant disc protrusion, foraminal stenosis, or canal stenosis. Bony retropulsion of the L1 vertebral body results in mild canal stenosis. L1-L2: No significant disc protrusion, foraminal stenosis, or canal stenosis. L2-L3: No significant disc protrusion, foraminal stenosis, or canal stenosis. L3-L4: Minimal disc bulge without foraminal or canal stenosis. L4-L5: Mild disc bulge, eccentric to the left without evidence of foraminal or canal stenosis. L5-S1: Diffuse disc bulge with a superimposed small central disc protrusion contacting the ventral thecal sac without canal stenosis. Bilateral foramina remain patent. IMPRESSION: 1. Multiple enhancing metastatic lesions  throughout the cervical, thoracic, and lumbar spine. There is acute pathologic compression fractures of T6, L1, and L4, as described above. Mild canal stenosis related to retropulsion of the L1 vertebral body. There is  no high-grade foraminal or canal stenosis at any spinal level. 2. No evidence of extraosseous spinal soft tissue mass or epidural involvement. 3. Expansile posterior right third rib metastatic lesion. 4. Additional metastatic lesions are seen within the left occipital condyle, manubrium, sacrum, and posterior right ilium. These results will be called to the ordering clinician or representative by the Radiologist Assistant, and communication documented in the PACS or [employer]. Electronically Signed   By: Duanne Guess D.O.   On: 08/25/2019 15:25   PERIPHERAL VASCULAR CATHETERIZATION  Result Date: 08/28/2019 See op note  NM PET Image Initial (PI) Skull Base To Thigh  Addendum Date: 08/27/2019   ADDENDUM REPORT: 08/27/2019 17:27 ADDENDUM: These results will be called to the ordering clinician or representative by the Radiologist Assistant, and communication documented in the PACS or [employer]. Electronically Signed   By: Donzetta Kohut M.D.   On: 08/27/2019 17:27   Result Date: 08/27/2019 CLINICAL DATA:  Initial treatment strategy for breast cancer. EXAM: NUCLEAR MEDICINE PET SKULL BASE TO THIGH TECHNIQUE: 7.05 mCi F-18 FDG was injected intravenously. Full-ring PET imaging was performed from the skull base to thigh after the radiotracer. CT data was obtained and used for attenuation correction and anatomic localization. Fasting blood glucose: 78 mg/dl COMPARISON:  Spinal evaluations of 08/25/2019 FINDINGS: Mediastinal blood pool activity: SUV max 2.72 Liver activity: SUV max not applicable NECK: Asymmetric tonsillar activity RIGHT palatine tonsils approximately select approximately SUV 10.4, on the LEFT 4.5. No hypermetabolic lymph nodes in the neck, see below for thoracic inlet  lymph nodes. Incidental CT findings: none CHEST: LEFT supraclavicular lymph node (image 55 of series 4) (SUVmax = 3.7 LEFT axillary lymph node (image 60, series 4) 7 mm (SUVmax = 5.1 with similar size and metabolically active lymph nodes in the area at least 7 additional lymph nodes. Smaller mildly hypermetabolic lymph nodes tracking towards the LEFT supraclavicular region under LEFT subpectoral musculature with a tiny lymph node with the maximum SUV of 2.5 seen on image 57 of series 4 measuring 4 mm. See below for rib lesions.) Asymmetric soft tissue in the LEFT breast and marker for biopsy in an area with a maximum SUV of 7.9 that measures approximately 2.7 cm maximal dimension (image 80, series 4) Contralateral axillary lymph node (SUVmax = 2.6 (image 65, series 4) 6 mm.) Incidental CT findings: Mild calcified atheromatous plaque in the thoracic aorta. Heart size normal without pericardial effusion. Central pulmonary vasculature is normal caliber. Signs of basilar atelectasis question of background ground-glass but no consolidation, no pleural effusion or suspicious pulmonary nodule. ABDOMEN/PELVIS: No abnormal hypermetabolic activity within the liver, pancreas, adrenal glands, or spleen. No hypermetabolic lymph nodes in the abdomen or pelvis. Bilateral uptake in the adnexa along the expected course of the LEFT and RIGHT ureter is slightly more diffuse than expected for ureteral related activity but given bilateral appearance this is suspected Incidental CT findings: Cystic structures in the RIGHT adnexa without FDG uptake likely ovarian cysts largest measuring approximately 2.7 x 2.4 cm. RIGHT intrarenal calculus. Interpolar RIGHT kidney 4 mm. No acute gastrointestinal process. The appendix is normal. SKELETON: Diffuse skeletal metastatic disease. LEFT C1/skull base lesion with soft tissue at the foramen magnum (image 17, series 4) (SUVmax = 9.2 soft tissue in this area measuring approximately 12 x 12 mm and  extending into the occipital condyle on the LEFT likely and also involving C1. C2 metastatic focus markedly hypermetabolic (SUVmax = 9.1 associated with vertebral body destruction measuring approximately 9 x 6  mm. Destructive changes at the C5 level also with marked hypermetabolic activity. Lesion associated with vertebral body destruction at T5-T6 showing marked hypermetabolic activity better demonstrated on the thoracic spine MRI. Pathologic fracture also at the L1 level and with adjacent destruction of L2 is associated with hypermetabolic activity as are multiple other foci in the lumbar spine. Lesion without CT correlate in the RIGHT proximal humerus. Destructive lesion involving the RIGHT second rib (SUVmax = 13.6 signs of metastasis to the sternal body, LEFT anterior third rib, RIGHT hemi sacrum, bilateral iliac bones, LEFT ischium and RIGHT femoral neck with smaller focus in the LEFT femoral shaft. Of particular concern are lesions with bone destruction at the first sacral level within the sacral ala measuring 2.8 x 1.9 cm (SUVmax = 11.5 similar activity with some bone destruction along the sacroiliac joint measuring 1.4 by 0.8 cm adjacent to the sacral lesion. Another area of considerable concern is the cortical destruction and soft tissue associated with RIGHT femoral neck (image 216, series 4) (SUVmax = 12.9) This is followed by the diffuse activity and cortical destruction noted in the LEFT iliac bone which is associated with extensive periosteal reaction extending from the ischium through the iliac and associated with a small fracture in the nonweightbearing portion of the iliac bone but just above the LEFT acetabulum. (SUVmax = 13.7.) ) Incidental CT findings: none IMPRESSION: 1. 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 Redemonstration of metastatic, pathologic fractures associated with multiple sites of metastatic disease throughout the spine. 2. Sites of  concern for future pathologic fracture of the RIGHT sacral ala and RIGHT proximal femur. 3. Pathologic fracture along the anterior aspect of the LEFT iliac bone just above the acetabulum with diffuse iliac and ischial 4. Asymmetric tonsillar/base of tongue activity in the neck is of uncertain significance. Direct clinical inspection or close attention on follow-up is suggested. 5. LEFT breast activity and adenopathy as discussed. 6. Area of activity in the bilateral adnexa favored to represent distal ureters. Attention on follow-up. Correlation could be made with contrasted imaging of the chest, abdomen and pelvis. Electronically Signed: By: Zetta Bills M.D. On: 08/27/2019 17:23   DG C-Arm 1-60 Min  Result Date: 09/01/2019 CLINICAL DATA:  Intramedullary nail insertion right femur secondary to metastatic disease and pinning fracture. EXAM: DG C-ARM 1-60 MIN; RIGHT FEMUR 2 VIEWS FLUOROSCOPY TIME:  Fluoroscopy Time:  3 minutes 45 seconds COMPARISON:  CT dated 08/27/2019 FINDINGS: C-arm images demonstrate insertion of intramedullary nail and proximal and distal screws in the right femur. The hardware appears in good position. Fracture. IMPRESSION: Satisfactory appearance of the right femur after intramedullary nail insertion. C-arm fluoroscopic images were obtained intraoperatively and submitted for post operative interpretation. Electronically Signed   By: Lorriane Shire M.D.   On: 09/01/2019 16:05   DG C-Arm 1-60 Min  Result Date: 09/01/2019 CLINICAL DATA:  Metastatic disease to the sacrum and pelvic bones. EXAM: DG C-ARM 1-60 MIN; SACRUM AND COCCYX - 2+ VIEW FLUOROSCOPY TIME:  Fluoroscopy Time:  3 minutes 45 seconds COMPARISON:  PET-CT dated 08/27/2019 FINDINGS: AP and lateral C-arm images demonstrate contrast in the right sacral ala and adjacent right iliac bone which correlates with metastatic lesions visible on PET-CT. IMPRESSION: Cement augmentation of the right ilium and right sacral ala after  radiofrequency ablation of metastatic disease. C-arm fluoroscopic images were obtained intraoperatively and submitted for post operative interpretation. Electronically Signed   By: Lorriane Shire M.D.   On: 09/01/2019 16:02  DG C-Arm 1-60 Min  Result Date: 08/29/2019 CLINICAL DATA:  Vertebral augmentation. EXAM: THORACIC SPINE 2 VIEWS; DG C-ARM 1-60 MIN COMPARISON:  MRI 08/25/2019 FINDINGS: Multiple C-arm images are provided. In the lumbar region, vertebral augmentation at L1 has a good appearance. Vertebral augmentation at the L4 level shows methylmethacrylate throughout the vertebral body, and also apparently based on this 1 projection a large amount within the spinal canal, measuring up to 9 mm in thickness and extending from the superior aspect of L4 down to the superior aspect of L5. This could result in compressive stenosis. Thoracic region imaging shows methylmethacrylate well distributed within the T6 vertebral body. There is probably some extension into the disc space. IMPRESSION: Vertebral augmentation at T6, L1 and L4. At L4, there appears to be ventral epidural cement measuring up to 9 mm in thickness. See above. Electronically Signed   By: Nelson Chimes M.D.   On: 08/29/2019 15:08   DG C-Arm 1-60 Min  Result Date: 08/29/2019 CLINICAL DATA:  Vertebral augmentation. EXAM: THORACIC SPINE 2 VIEWS; DG C-ARM 1-60 MIN COMPARISON:  MRI 08/25/2019 FINDINGS: Multiple C-arm images are provided. In the lumbar region, vertebral augmentation at L1 has a good appearance. Vertebral augmentation at the L4 level shows methylmethacrylate throughout the vertebral body, and also apparently based on this 1 projection a large amount within the spinal canal, measuring up to 9 mm in thickness and extending from the superior aspect of L4 down to the superior aspect of L5. This could result in compressive stenosis. Thoracic region imaging shows methylmethacrylate well distributed within the T6 vertebral body. There is  probably some extension into the disc space. IMPRESSION: Vertebral augmentation at T6, L1 and L4. At L4, there appears to be ventral epidural cement measuring up to 9 mm in thickness. See above. Electronically Signed   By: Nelson Chimes M.D.   On: 08/29/2019 15:08   US BREAST LTD UNI LEFT INC AXILLA  Result Date: 08/07/2019 CLINICAL DATA:  Patient presents for evaluation of palpable abnormality within the left breast. EXAM: DIGITAL DIAGNOSTIC BILATERAL MAMMOGRAM WITH CAD AND TOMO ULTRASOUND BILATERAL BREAST COMPARISON:  Previous exam(s). ACR Breast Density Category c: The breast tissue is heterogeneously dense, which may obscure small masses. FINDINGS: Underlying the palpable marker within the upper outer left breast is a focal area of masslike distortion. There are a few coarse calcifications associated with this mass. Within the lateral left breast anterior to middle depth there is a 5 mm group of coarse heterogeneous calcifications. Questioned asymmetry within the superior right breast resolved with additional imaging. Mammographic images were processed with CAD. On physical exam, there is a firm mass within the upper-outer left breast. Targeted ultrasound is performed, showing a 3.1 x 1.9 x 2.9 cm lobular hypoechoic mass left breast 1 o'clock position 4 cm from nipple corresponding with mass on mammogram. At least 4 abnormal cortically thickened left axillary lymph nodes are demonstrated measuring up to 5 mm. Dense tissue is demonstrated within the superior right breast. No suspicious mass is identified. IMPRESSION: 1. Suspicious palpable left breast mass 1 o'clock position. 2. Multiple (at least 4) cortically thickened left axillary lymph nodes. 3. Suspicious calcifications outer left breast middle depth. RECOMMENDATION: 1. Ultrasound-guided core needle biopsy left breast mass 1 o'clock position. 2. Ultrasound-guided core needle biopsy of 1 of the cortically thickened left axillary lymph nodes. 3.  Stereotactic guided core needle biopsy of the coarse heterogeneous calcifications within the outer left breast. I have discussed the findings and recommendations with  the patient. If applicable, a reminder letter will be sent to the patient regarding the next appointment. BI-RADS CATEGORY  5: Highly suggestive of malignancy. Electronically Signed   By: Lovey Newcomer M.D.   On: 08/07/2019 12:06   US BREAST LTD UNI RIGHT INC AXILLA  Result Date: 08/07/2019 CLINICAL DATA:  Patient presents for evaluation of palpable abnormality within the left breast. EXAM: DIGITAL DIAGNOSTIC BILATERAL MAMMOGRAM WITH CAD AND TOMO ULTRASOUND BILATERAL BREAST COMPARISON:  Previous exam(s). ACR Breast Density Category c: The breast tissue is heterogeneously dense, which may obscure small masses. FINDINGS: Underlying the palpable marker within the upper outer left breast is a focal area of masslike distortion. There are a few coarse calcifications associated with this mass. Within the lateral left breast anterior to middle depth there is a 5 mm group of coarse heterogeneous calcifications. Questioned asymmetry within the superior right breast resolved with additional imaging. Mammographic images were processed with CAD. On physical exam, there is a firm mass within the upper-outer left breast. Targeted ultrasound is performed, showing a 3.1 x 1.9 x 2.9 cm lobular hypoechoic mass left breast 1 o'clock position 4 cm from nipple corresponding with mass on mammogram. At least 4 abnormal cortically thickened left axillary lymph nodes are demonstrated measuring up to 5 mm. Dense tissue is demonstrated within the superior right breast. No suspicious mass is identified. IMPRESSION: 1. Suspicious palpable left breast mass 1 o'clock position. 2. Multiple (at least 4) cortically thickened left axillary lymph nodes. 3. Suspicious calcifications outer left breast middle depth. RECOMMENDATION: 1. Ultrasound-guided core needle biopsy left breast mass 1  o'clock position. 2. Ultrasound-guided core needle biopsy of 1 of the cortically thickened left axillary lymph nodes. 3. Stereotactic guided core needle biopsy of the coarse heterogeneous calcifications within the outer left breast. I have discussed the findings and recommendations with the patient. If applicable, a reminder letter will be sent to the patient regarding the next appointment. BI-RADS CATEGORY  5: Highly suggestive of malignancy. Electronically Signed   By: Lovey Newcomer M.D.   On: 08/07/2019 12:06   ECHOCARDIOGRAM COMPLETE  Result Date: 08/26/2019    ECHOCARDIOGRAM REPORT   Patient Name:   AVEAH CASTELL [facility] Date of Exam: 08/26/2019 Medical Rec #:  [id number]             Height:       61.0 in Accession #:    [id number]            Weight:       139.2 lb Date of Birth:  Jul 31, 1980             BSA:          1.619 m Patient Age:    55 years              BP:           112/54 mmHg Patient Gender: F                     HR:           94 bpm. Exam Location:  [facility] Procedure: 2D Echo, Color Doppler and Cardiac Doppler Indications:     v58.11 Chemotherapy evaluation  History:         Patient has no prior history of Echocardiogram examinations.                  Vertigo.  Sonographer:     Charmayne Sheer RDCS (AE) Referring Phys:  [id number] [place] C RAO Diagnosing Phys: Harrell Gave End MD  Sonographer Comments: Suboptimal subcostal window. IMPRESSIONS  1. Left ventricular ejection fraction, by estimation, is 65 to 70%. The left ventricle has normal function. The left ventricle has no regional wall motion abnormalities. Left ventricular diastolic parameters are indeterminate.  2. Right ventricular systolic function is normal. The right ventricular size is normal.  3. The mitral valve is normal in structure. No evidence of mitral valve regurgitation. No evidence of mitral stenosis.  4. The aortic valve is grossly normal. Aortic valve regurgitation is not visualized. No aortic stenosis is present.  5. The inferior vena  cava is normal in size with greater than 50% respiratory variability, suggesting right atrial pressure of 3 mmHg. FINDINGS  Left Ventricle: Left ventricular ejection fraction, by estimation, is 65 to 70%. The left ventricle has normal function. The left ventricle has no regional wall motion abnormalities. The left ventricular internal cavity size was normal in size. There is  no left ventricular hypertrophy. Left ventricular diastolic parameters are indeterminate. Right Ventricle: The right ventricular size is normal. No increase in right ventricular wall thickness. Right ventricular systolic function is normal. Left Atrium: Left atrial size was normal in size. Right Atrium: Right atrial size was not well visualized. Pericardium: The pericardium was not well visualized. Mitral Valve: The mitral valve is normal in structure. No evidence of mitral valve regurgitation. No evidence of mitral valve stenosis. MV peak gradient, 3.2 mmHg. The mean mitral valve gradient is 1.0 mmHg. Tricuspid Valve: The tricuspid valve is not well visualized. Tricuspid valve regurgitation is not demonstrated. Aortic Valve: The aortic valve is grossly normal. Aortic valve regurgitation is not visualized. No aortic stenosis is present. Aortic valve mean gradient measures 3.0 mmHg. Aortic valve peak gradient measures 5.3 mmHg. Aortic valve area, by VTI measures 1.60 cm. Pulmonic Valve: The pulmonic valve was not well visualized. Pulmonic valve regurgitation is not visualized. No evidence of pulmonic stenosis. Aorta: The aortic root is normal in size and structure. Pulmonary Artery: The pulmonary artery is of normal size. Venous: The inferior vena cava is normal in size with greater than 50% respiratory variability, suggesting right atrial pressure of 3 mmHg. IAS/Shunts: The interatrial septum was not well visualized.  LEFT VENTRICLE PLAX 2D LVIDd:         4.21 cm  Diastology LVIDs:         2.58 cm  LV e' lateral:   9.25 cm/s LV PW:          0.82 cm  LV E/e' lateral: 6.7 LV IVS:        0.69 cm  LV e' medial:    7.51 cm/s LVOT diam:     1.60 cm  LV E/e' medial:  8.2 LV SV:         29 LV SV Index:   18 LVOT Area:     2.01 cm  LEFT ATRIUM             Index LA diam:        2.60 cm 1.61 cm/m LA Vol (A2C):   20.5 ml 12.66 ml/m LA Vol (A4C):   29.3 ml 18.10 ml/m LA Biplane Vol: 26.1 ml 16.12 ml/m  AORTIC VALVE                   PULMONIC VALVE AV Area (Vmax):    1.74 cm    PV Vmax:       1.08 m/s AV Area (Vmean):  1.69 cm    PV Vmean:      76.800 cm/s AV Area (VTI):     1.60 cm    PV VTI:        0.202 m AV Vmax:           115.00 cm/s PV Peak grad:  4.7 mmHg AV Vmean:          75.100 cm/s PV Mean grad:  3.0 mmHg AV VTI:            0.179 m AV Peak Grad:      5.3 mmHg AV Mean Grad:      3.0 mmHg LVOT Vmax:         99.80 cm/s LVOT Vmean:        63.000 cm/s LVOT VTI:          0.142 m LVOT/AV VTI ratio: 0.79  AORTA Ao Root diam: 2.60 cm MITRAL VALVE MV Area (PHT): 6.17 cm    SHUNTS MV Peak grad:  3.2 mmHg    Systemic VTI:  0.14 m MV Mean grad:  1.0 mmHg    Systemic Diam: 1.60 cm MV Vmax:       0.89 m/s MV Vmean:      58.1 cm/s MV Decel Time: 123 msec MV E velocity: 61.60 cm/s MV A velocity: 77.60 cm/s MV E/A ratio:  0.79 Harrell Gave End MD Electronically signed by Nelva Bush MD Signature Date/Time: 08/26/2019/1:28:27 PM    Final    MS DIGITAL DIAG TOMO BILAT  Result Date: 08/07/2019 CLINICAL DATA:  Patient presents for evaluation of palpable abnormality within the left breast. EXAM: DIGITAL DIAGNOSTIC BILATERAL MAMMOGRAM WITH CAD AND TOMO ULTRASOUND BILATERAL BREAST COMPARISON:  Previous exam(s). ACR Breast Density Category c: The breast tissue is heterogeneously dense, which may obscure small masses. FINDINGS: Underlying the palpable marker within the upper outer left breast is a focal area of masslike distortion. There are a few coarse calcifications associated with this mass. Within the lateral left breast anterior to middle depth there is a 5 mm  group of coarse heterogeneous calcifications. Questioned asymmetry within the superior right breast resolved with additional imaging. Mammographic images were processed with CAD. On physical exam, there is a firm mass within the upper-outer left breast. Targeted ultrasound is performed, showing a 3.1 x 1.9 x 2.9 cm lobular hypoechoic mass left breast 1 o'clock position 4 cm from nipple corresponding with mass on mammogram. At least 4 abnormal cortically thickened left axillary lymph nodes are demonstrated measuring up to 5 mm. Dense tissue is demonstrated within the superior right breast. No suspicious mass is identified. IMPRESSION: 1. Suspicious palpable left breast mass 1 o'clock position. 2. Multiple (at least 4) cortically thickened left axillary lymph nodes. 3. Suspicious calcifications outer left breast middle depth. RECOMMENDATION: 1. Ultrasound-guided core needle biopsy left breast mass 1 o'clock position. 2. Ultrasound-guided core needle biopsy of 1 of the cortically thickened left axillary lymph nodes. 3. Stereotactic guided core needle biopsy of the coarse heterogeneous calcifications within the outer left breast. I have discussed the findings and recommendations with the patient. If applicable, a reminder letter will be sent to the patient regarding the next appointment. BI-RADS CATEGORY  5: Highly suggestive of malignancy. Electronically Signed   By: Lovey Newcomer M.D.   On: 08/07/2019 12:06   MS DIGITAL DIAG UNI LEFT  Addendum Date: 08/20/2019   ADDENDUM REPORT: 08/20/2019 09:06 ADDENDUM: PATHOLOGY revealed: A. BREAST, LEFT AT 1:00; ULTRASOUND-GUIDED CORE NEEDLE BIOPSY: - INVASIVE MAMMARY CARCINOMA,  NO SPECIAL TYPE. At least 9 mm in this sample. Grade 2. Ductal carcinoma in situ: Not identified. Lymphovascular invasion: Not identified. B. AXILLA, LEFT; ULTRASOUND-GUIDED CORE NEEDLE BIOPSY: - INVASIVE MAMMARY CARCINOMA, NO SPECIAL TYPE. Comment: Invasive carcinoma within this specimen measures  approximately 1 mm in greatest extent, and is histologically similar to tumor identified in part A. No definitive lymph node tissue is identified. C. BREAST, LEFT UPPER OUTER QUADRANT; STEREOTACTIC CORE NEEDLE 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 CARCINOMA IN SITU, HIGH GRADE WITH COMEDONECROSIS, WITH ASSOCIATED CALCIFICATIONS. Comment: The specimen consists predominantly of ductal carcinoma in situ, present in 7 of 8 tissue blocks, and measuring up to 2 mm in greatest linear extent. DCIS is associated with calcifications. Invasive carcinoma is identified within a single block (C7), measuring 1 mm in greatest extent, and is histologically similar to tumor seen in parts A and B. Hormone receptor testing will be limited to specimen A. Pathology results are CONCORDANT with imaging findings, per Dr. Audie Pinto. Breast MRI is recommended for extent of disease. Multiple attempts to contact patient, using [employer], were unsuccessful. Notified Tanya Nones RN at [facility] (who is patient's Furniture conservator/restorer) and she will contact patient with biopsy results and arrange surgical referral. Addendum by Electa Sniff RN on 08/20/2019. Electronically Signed   By: Audie Pinto M.D.   On: 08/20/2019 09:06   Result Date: 08/20/2019 CLINICAL DATA:  39 year old female presenting for biopsy of a left breast mass, left breast calcifications and a left axillary lymph node. EXAM: DIAGNOSTIC LEFT MAMMOGRAM POST ULTRASOUND AND STEREOTACTIC BIOPSIES COMPARISON:  Previous exam(s). FINDINGS: Mammographic images were obtained following ultrasound guided biopsy of a left breast mass at 1 o'clock. The coil biopsy marking clip is in expected position at the site of biopsy. Mammographic images were obtained following ultrasound guided biopsy of a left axillary lymph node. The [facility] biopsy marking clip is in expected position at the site of biopsy. Mammographic images were  obtained following stereotactic guided biopsy of calcifications. The X biopsy marking clip is in expected position at the site of biopsy. IMPRESSION: Appropriate positioning of the coil shaped biopsy marking clip at the site of biopsy in the left breast 1 o'clock Appropriate positioning of the X shaped biopsy marking clip at the site of biopsy in the upper outer left breast. Appropriate positioning of the [facility] shaped biopsy marking clip at the site of biopsy in the left axilla. Final Assessment: Post Procedure Mammograms for Marker Placement Electronically Signed: By: Audie Pinto M.D. On: 08/14/2019 09:36   DG Hip Unilat W or Wo Pelvis 2-3 Views Right  Result Date: 08/28/2019 CLINICAL DATA:  Right hip pain. History of metastatic breast cancer. EXAM: DG HIP (WITH OR WITHOUT PELVIS) 2-3V RIGHT COMPARISON:  None. FINDINGS: There is no evidence of hip fracture or dislocation. There is no evidence of arthropathy. A 4 mm round sclerotic focus is seen within the inter trochanteric region of the proximal right femur. IMPRESSION: Small sclerotic focus within the proximal right femur which may represent a small bone island. Given the patient's history of metastatic breast cancer, a small metastatic focus cannot be excluded. Electronically Signed   By: Virgina Norfolk M.D.   On: 08/28/2019 21:01   DG FEMUR, MIN 2 VIEWS RIGHT  Result Date: 09/01/2019 CLINICAL DATA:  Intramedullary nail insertion right femur secondary to metastatic disease and pinning fracture. EXAM: DG C-ARM 1-60 MIN; RIGHT FEMUR 2 VIEWS FLUOROSCOPY TIME:  Fluoroscopy Time:  3 minutes 45 seconds COMPARISON:  CT dated 08/27/2019 FINDINGS: C-arm images demonstrate insertion of intramedullary nail and proximal and distal screws in the right femur. The hardware appears in good position. Fracture. IMPRESSION: Satisfactory appearance of the right femur after intramedullary nail insertion. C-arm fluoroscopic images were obtained intraoperatively  and submitted for post operative interpretation. Electronically Signed   By: Lorriane Shire M.D.   On: 09/01/2019 16:05   MM LT BREAST BX W LOC DEV 1ST LESION IMAGE BX SPEC STEREO GUIDE  Addendum Date: 08/20/2019   ADDENDUM REPORT: 08/20/2019 09:06 ADDENDUM: PATHOLOGY revealed: A. BREAST, LEFT AT 1:00; ULTRASOUND-GUIDED CORE NEEDLE BIOPSY: - INVASIVE MAMMARY CARCINOMA, NO SPECIAL TYPE. At least 9 mm in this sample. Grade 2. Ductal carcinoma in situ: Not identified. Lymphovascular invasion: Not identified. B. AXILLA, LEFT; ULTRASOUND-GUIDED CORE NEEDLE BIOPSY: - INVASIVE MAMMARY CARCINOMA, NO SPECIAL TYPE. Comment: Invasive carcinoma within this specimen measures approximately 1 mm in greatest extent, and is histologically similar to tumor identified in part A. No definitive lymph node tissue is identified. C. BREAST, LEFT UPPER OUTER QUADRANT; STEREOTACTIC CORE NEEDLE 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 CARCINOMA IN SITU, HIGH GRADE WITH COMEDONECROSIS, WITH ASSOCIATED CALCIFICATIONS. Comment: The specimen consists predominantly of ductal carcinoma in situ, present in 7 of 8 tissue blocks, and measuring up to 2 mm in greatest linear extent. DCIS is associated with calcifications. Invasive carcinoma is identified within a single block (C7), measuring 1 mm in greatest extent, and is histologically similar to tumor seen in parts A and B. Hormone receptor testing will be limited to specimen A. Pathology results are CONCORDANT with imaging findings, per Dr. Audie Pinto. Breast MRI is recommended for extent of disease. Multiple attempts to contact patient, using [employer], were unsuccessful. Notified Tanya Nones RN at [facility] (who is patient's Furniture conservator/restorer) and she will contact patient with biopsy results and arrange surgical referral. Addendum by Electa Sniff RN on 08/20/2019. Electronically Signed   By: Audie Pinto M.D.   On: 08/20/2019  09:06   Result Date: 08/20/2019 CLINICAL DATA:  39 year old female presenting for biopsy of a left breast mass, left axillary lymph node and left breast calcifications. EXAM: LEFT BREAST STEREOTACTIC CORE NEEDLE BIOPSY COMPARISON:  Previous exams. FINDINGS: The patient and I discussed the procedure of stereotactic-guided biopsy including benefits and alternatives. We discussed the high likelihood of a successful procedure. We discussed the risks of the procedure including infection, bleeding, tissue injury, clip migration, and inadequate sampling. Informed written consent was given. The usual time out protocol was performed immediately prior to the procedure. Using sterile technique and 1% Lidocaine as local anesthetic, under stereotactic guidance, a 9 gauge vacuum assisted device was used to perform core needle biopsy of calcifications in the upper outer left breast using a superior approach. Specimen radiograph was performed showing at least 4 specimens with calcifications. Specimens with calcifications are identified for pathology. Lesion quadrant: Upper outer quadrant At the conclusion of the procedure, an X tissue marker clip was deployed into the biopsy cavity. Follow-up 2-view mammogram was performed and dictated separately. IMPRESSION: Stereotactic-guided biopsy of calcifications in the upper outer left breast. No apparent complications. Electronically Signed: By: Audie Pinto M.D. On: 08/14/2019 09:43   [place] LT BREAST BX W LOC DEV 1ST LESION IMG BX SPEC US GUIDE  Addendum Date: 08/20/2019   ADDENDUM REPORT: 08/20/2019 09:04 ADDENDUM: PATHOLOGY revealed: A. BREAST, LEFT AT 1:00; ULTRASOUND-GUIDED CORE NEEDLE BIOPSY: - INVASIVE MAMMARY CARCINOMA, NO  SPECIAL TYPE. At least 9 mm in this sample. Grade 2. Ductal carcinoma in situ: Not identified. Lymphovascular invasion: Not identified. B. AXILLA, LEFT; ULTRASOUND-GUIDED CORE NEEDLE BIOPSY: - INVASIVE MAMMARY CARCINOMA, NO SPECIAL TYPE. Comment: Invasive  carcinoma within this specimen measures approximately 1 mm in greatest extent, and is histologically similar to tumor identified in part A. No definitive lymph node tissue is identified. C. BREAST, LEFT UPPER OUTER QUADRANT; STEREOTACTIC CORE NEEDLE 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 CARCINOMA IN SITU, HIGH GRADE WITH COMEDONECROSIS, WITH ASSOCIATED CALCIFICATIONS. Comment: The specimen consists predominantly of ductal carcinoma in situ, present in 7 of 8 tissue blocks, and measuring up to 2 mm in greatest linear extent. DCIS is associated with calcifications. Invasive carcinoma is identified within a single block (C7), measuring 1 mm in greatest extent, and is histologically similar to tumor seen in parts A and B. Hormone receptor testing will be limited to specimen A. Pathology results are CONCORDANT with imaging findings, per Dr. Audie Pinto. Breast MRI is recommended for extent of disease. Multiple attempts to contact patient, using [employer], were unsuccessful. Notified Tanya Nones RN at [facility] (who is patient's Furniture conservator/restorer) and she will contact patient with biopsy results and arrange surgical referral. Addendum by Electa Sniff RN on 08/20/2019. Electronically Signed   By: Audie Pinto M.D.   On: 08/20/2019 09:04   Result Date: 08/20/2019 CLINICAL DATA:  39 year old female presenting for biopsy of a left breast mass, left axillary lymph node and left breast calcifications. EXAM: ULTRASOUND GUIDED LEFT BREAST CORE NEEDLE BIOPSY [place] AXILLARY NODE CORE BIOPSY LEFT COMPARISON:  Previous exam(s). PROCEDURE: I met with the patient and we discussed the procedure of ultrasound-guided biopsy, including benefits and alternatives. We discussed the high likelihood of a successful procedure. We discussed the risks of the procedure, including infection, bleeding, tissue injury, clip migration, and inadequate sampling. Informed written consent was  given. The usual time-out protocol was performed immediately prior to the procedure. 1.  Lesion quadrant: Upper outer quadrant Using sterile technique and 1% Lidocaine as local anesthetic, under direct ultrasound visualization, a 12 gauge spring-loaded device was used to perform biopsy of a mass in the left breast at 1 o'clock using a lateral approach. At the conclusion of the procedure a coil tissue marker clip was deployed into the biopsy cavity. Follow up 2 view mammogram was performed and dictated separately. 2.  Axilla Using sterile technique and 1% Lidocaine as local anesthetic, under direct ultrasound visualization, a 14 gauge spring-loaded device was used to perform biopsy of a left axillary lymph node using a lateral approach. At the conclusion of the procedure a HydroMARK tissue marker clip was deployed into the biopsy cavity. Follow up 2 view mammogram was performed and dictated separately. IMPRESSION: Ultrasound guided biopsy of a left breast mass at 1 o'clock and a left axillary lymph node. No apparent complications. Electronically Signed: By: Audie Pinto M.D. On: 08/14/2019 09:42   [place] LT BREAST BX W LOC DEV EA ADD LESION IMG BX SPEC US GUIDE  Addendum Date: 08/20/2019   ADDENDUM REPORT: 08/20/2019 09:04 ADDENDUM: PATHOLOGY revealed: A. BREAST, LEFT AT 1:00; ULTRASOUND-GUIDED CORE NEEDLE BIOPSY: - INVASIVE MAMMARY CARCINOMA, NO SPECIAL TYPE. At least 9 mm in this sample. Grade 2. Ductal carcinoma in situ: Not identified. Lymphovascular invasion: Not identified. B. AXILLA, LEFT; ULTRASOUND-GUIDED CORE NEEDLE BIOPSY: - INVASIVE MAMMARY CARCINOMA, NO SPECIAL TYPE. Comment: Invasive carcinoma within this specimen measures approximately 1 mm in greatest  extent, and is histologically similar to tumor identified in part A. No definitive lymph node tissue is identified. C. BREAST, LEFT UPPER OUTER QUADRANT; STEREOTACTIC CORE NEEDLE 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  CARCINOMA IN SITU, HIGH GRADE WITH COMEDONECROSIS, WITH ASSOCIATED CALCIFICATIONS. Comment: The specimen consists predominantly of ductal carcinoma in situ, present in 7 of 8 tissue blocks, and measuring up to 2 mm in greatest linear extent. DCIS is associated with calcifications. Invasive carcinoma is identified within a single block (C7), measuring 1 mm in greatest extent, and is histologically similar to tumor seen in parts A and B. Hormone receptor testing will be limited to specimen A. Pathology results are CONCORDANT with imaging findings, per Dr. Audie Pinto. Breast MRI is recommended for extent of disease. Multiple attempts to contact patient, using [employer], were unsuccessful. Notified Tanya Nones RN at [facility] (who is patient's Furniture conservator/restorer) and she will contact patient with biopsy results and arrange surgical referral. Addendum by Electa Sniff RN on 08/20/2019. Electronically Signed   By: Audie Pinto M.D.   On: 08/20/2019 09:04   Result Date: 08/20/2019 CLINICAL DATA:  39 year old female presenting for biopsy of a left breast mass, left axillary lymph node and left breast calcifications. EXAM: ULTRASOUND GUIDED LEFT BREAST CORE NEEDLE BIOPSY [place] AXILLARY NODE CORE BIOPSY LEFT COMPARISON:  Previous exam(s). PROCEDURE: I met with the patient and we discussed the procedure of ultrasound-guided biopsy, including benefits and alternatives. We discussed the high likelihood of a successful procedure. We discussed the risks of the procedure, including infection, bleeding, tissue injury, clip migration, and inadequate sampling. Informed written consent was given. The usual time-out protocol was performed immediately prior to the procedure. 1.  Lesion quadrant: Upper outer quadrant Using sterile technique and 1% Lidocaine as local anesthetic, under direct ultrasound visualization, a 12 gauge spring-loaded device was used to perform biopsy of a mass in the left  breast at 1 o'clock using a lateral approach. At the conclusion of the procedure a coil tissue marker clip was deployed into the biopsy cavity. Follow up 2 view mammogram was performed and dictated separately. 2.  Axilla Using sterile technique and 1% Lidocaine as local anesthetic, under direct ultrasound visualization, a 14 gauge spring-loaded device was used to perform biopsy of a left axillary lymph node using a lateral approach. At the conclusion of the procedure a HydroMARK tissue marker clip was deployed into the biopsy cavity. Follow up 2 view mammogram was performed and dictated separately. IMPRESSION: Ultrasound guided biopsy of a left breast mass at 1 o'clock and a left axillary lymph node. No apparent complications. Electronically Signed: By: Audie Pinto M.D. On: 08/14/2019 09:42   MM Outside Films Mammo  Result Date: 08/08/2019 This examination belongs to an outside facility and is stored here for comparison purposes only.  Contact the originating outside institution for any associated report or interpretation.   Time coordinating discharge: Over 30 minutes  SIGNED:   Guilford Shi, MD  [facility] 09/04/2019, 12:31 PM

## 2019-09-04 NOTE — Progress Notes (Unsigned)
Re: Infusion Reaction  Received phone call from infusion that patient having reaction to new Herceptin.  Symptoms included feeling cold, rigors blood pressure and heart rate elevation (140's).   She was given IV Benadryl, Solu-Medrol and Pepcid.  She was also given IV Demerol for rigors.  Symptoms improved less than 1 hour later.  Restarted Herceptin.  Patient tolerated well.  Faythe Casa, NP 09/04/2019 3:52 PM

## 2019-09-04 NOTE — Progress Notes (Addendum)
OT Cancellation Note  Patient Details Name: Sophia [name] MRN: [id number] DOB: [birth year]   Cancelled Treatment:    Reason Eval/Treat Not Completed: Patient at procedure or test/ unavailable  Pt leaving floor to radiation department at this time. Will f/u as able for OT treatment. Thank you.   Gerrianne Scale, [place], OTR/L ascom [phone] 09/04/19, 9:00 AM

## 2019-09-04 NOTE — Progress Notes (Signed)
OT Cancellation Note  Patient Details Name: Sophia [name] MRN: [id number] DOB: [birth year]   Cancelled Treatment:    Reason Eval/Treat Not Completed: Patient at procedure or test/ unavailable  OT presents second time and pt still off floor for chemo at this time. OT goes over use of AE for LB ADLs and provides handout to pt's two friends that are present and awaiting her return. Demonstration provided and both parties confirm understanding. Will attempt to f/u for treatment with pt if/when she becomes available after treatment/prior to discharge. Thank you.  Gerrianne Scale, [place], OTR/L ascom [phone] 09/04/19, 2:48 PM

## 2019-09-08 ENCOUNTER — Telehealth: Payer: Self-pay | Admitting: *Deleted

## 2019-09-08 ENCOUNTER — Telehealth: Payer: Self-pay

## 2019-09-08 NOTE — Telephone Encounter (Signed)
Can she OTC imodium 2 mg with every episode of diarrhea. Not to exceed 16 mg a day. If it doesn't help, call us

## 2019-09-08 NOTE — Telephone Encounter (Signed)
Call from patient requesting an interpreter, I called and got [name] on phone and conferenced patient into call with interpreter. Patient reports that she has had diarrhea since Saturday and when it hits, she has a stomach pain and then it is with urgency and she has not made it to the bathroom on several occasions. She denies n/v, chills, fever, or anorexia. She has not tried anything over the counter for it either. Please advise

## 2019-09-08 NOTE — Telephone Encounter (Signed)
T/C to pt for follow up after receiving first infusion using our interpreter services [place].  Pt states infusion went well except of "pins pricking" in her leg during the infusion.   Pt apparently had a reaction and was treated accordingly.   Pt states already talk to someone today about her diarrhea and it is improving.  Encourage pt to call for any questions or concerns.

## 2019-09-08 NOTE — Telephone Encounter (Signed)
Message conved to patient using interpreter services, Adin Hector and patient questions answered

## 2019-09-09 ENCOUNTER — Encounter: Payer: Self-pay | Admitting: *Deleted

## 2019-09-09 ENCOUNTER — Telehealth: Payer: Self-pay | Admitting: *Deleted

## 2019-09-09 ENCOUNTER — Encounter: Payer: Self-pay | Admitting: Pharmacy Technician

## 2019-09-09 LAB — SURGICAL PATHOLOGY

## 2019-09-09 NOTE — Telephone Encounter (Signed)
Called pt's phone and friend Izola Price. Both got voicemail and left message that next treatment is 6/24 arrive 8 am to have labs , see md and then treatment. I had told pt when she was in infusion last week that we would make a new appt for this week. I was not sure if staff member brought it back there to her while in office so this was reminder about the next appt

## 2019-09-09 NOTE — Progress Notes (Signed)
Patient has been approved for drug assistance by [place] for Herceptin and Perjeta. The enrollment period is from 09/04/19-indefinitely based on self pay. First DOS covered is 09/04/19.

## 2019-09-10 ENCOUNTER — Encounter: Payer: Self-pay | Admitting: *Deleted

## 2019-09-10 ENCOUNTER — Other Ambulatory Visit: Payer: Self-pay

## 2019-09-10 ENCOUNTER — Encounter: Payer: Self-pay | Admitting: Radiation Oncology

## 2019-09-10 ENCOUNTER — Other Ambulatory Visit: Payer: Self-pay | Admitting: *Deleted

## 2019-09-10 ENCOUNTER — Ambulatory Visit
Admission: RE | Admit: 2019-09-10 | Discharge: 2019-09-10 | Disposition: A | Discharge status: Home / Self Care | Payer: Self-pay | Source: Ambulatory Visit | Attending: Radiation Oncology | Admitting: Radiation Oncology

## 2019-09-10 VITALS — BP 117/84 | HR 107 | Temp 98.0°F | Resp 16

## 2019-09-10 DIAGNOSIS — C50919 Malignant neoplasm of unspecified site of unspecified female breast: Secondary | ICD-10-CM

## 2019-09-10 DIAGNOSIS — Z171 Estrogen receptor negative status [ER-]: Secondary | ICD-10-CM | POA: Insufficient documentation

## 2019-09-10 DIAGNOSIS — Z800 Family history of malignant neoplasm of digestive organs: Secondary | ICD-10-CM | POA: Insufficient documentation

## 2019-09-10 DIAGNOSIS — Z79899 Other long term (current) drug therapy: Secondary | ICD-10-CM | POA: Insufficient documentation

## 2019-09-10 DIAGNOSIS — C50911 Malignant neoplasm of unspecified site of right female breast: Secondary | ICD-10-CM | POA: Insufficient documentation

## 2019-09-10 DIAGNOSIS — C7951 Secondary malignant neoplasm of bone: Secondary | ICD-10-CM | POA: Insufficient documentation

## 2019-09-10 DIAGNOSIS — F419 Anxiety disorder, unspecified: Secondary | ICD-10-CM | POA: Insufficient documentation

## 2019-09-10 MED ORDER — LIDOCAINE-PRILOCAINE 2.5-2.5 % EX CREA
1.0000 | TOPICAL_CREAM | CUTANEOUS | 1 refills | Status: DC | PRN
Start: 2019-09-10 — End: 2020-02-20

## 2019-09-10 NOTE — Consult Note (Signed)
NEW PATIENT EVALUATION  Name: Sophia [name]  MRN: [id number]  Date:   09/10/2019     DOB: 1981/01/21   This 39 y.o. female patient presents to the clinic for initial evaluation of widespread metastatic disease from known stage IV breast cancer.  REFERRING PHYSICIAN: [facility]  CHIEF COMPLAINT:  Chief Complaint  Patient presents with  . Cancer    Initial consultation    DIAGNOSIS: The encounter diagnosis was Bone metastasis ([place]).   PREVIOUS INVESTIGATIONS:  PET CT scan reviewed Pathology report reviewed Clinical notes reviewed  HPI: Patient is a 39 year old Spanish-speaking female with known widespread metastatic breast cancer.  She was noted on PET CT scan to have widespread metastatic disease of the axilla and appendical skeleton.  She had pathologic fractures of multiple vertebral sites underwent instrumentation and kyphoplasty with radiofrequency ablation at T6 L1 and L4.  She also had radiofrequency ablation and cement augmentation of right sacral iliac crest lesion.  She had prophylactic rodding and radiofrequency ablation of her right proximal femur.  Her breast cancer is ER/PR negative HER-2/neu positive.  She is currently on narcotic analgesics for pain most especially in the right hip radiating down to the right lower extremity.  She is scheduled to start Herceptin perse plus Perjeta.  She also will be receiving weekly weekly Taxol.  She is seen today accompanied by interpreter.  Her main pain as described above is right hip right lower extremity.   PLANNED TREATMENT REGIMEN: Palliative radiation therapy to right sacrum right acetabular region  PAST MEDICAL HISTORY:  has a past medical history of Anxiety, Cancer ([place]), and Vertigo.    PAST SURGICAL HISTORY:  Past Surgical History:  Procedure Laterality Date  . BREAST BIOPSY Left 08/14/2019   [place] bx of mass, coil marker, path pending  . BREAST BIOPSY Left 08/14/2019   [place] bx of LN, hydromarker, path  pending  . BREAST BIOPSY Left 08/14/2019   affirm bx of calcs, x marker, path pending  . INTRAMEDULLARY (IM) NAIL INTERTROCHANTERIC Right 09/01/2019   Procedure: INTRAMEDULLARY (IM) NAIL INTERTROCHANTRIC AND RADIOFREQUENCY ABLATION;  Surgeon: Hessie Knows, MD;  Location: [facility];  Service: Orthopedics;  Laterality: Right;  . KYPHOPLASTY N/A 08/29/2019   Procedure: KYPHOPLASTY T6, L1,L4 ,  RADIOFREQUENCY ABLATION;  Surgeon: Hessie Knows, MD;  Location: [facility];  Service: Orthopedics;  Laterality: N/A;  . KYPHOPLASTY Right 09/01/2019   Procedure: Right Sacral Radiofrequency Ablation and Cement Augmentation, Right sacrum and iliac crest;  Surgeon: Hessie Knows, MD;  Location: [facility];  Service: Orthopedics;  Laterality: Right;  . PORTA CATH INSERTION N/A 08/28/2019   Procedure: PORTA CATH INSERTION;  Surgeon: Algernon Huxley, MD;  Location: [facility];  Service: Cardiovascular;  Laterality: N/A;    FAMILY HISTORY: family history includes Colon cancer in her maternal uncle.  SOCIAL HISTORY:  reports that she has never smoked. She has never used smokeless tobacco. She reports current alcohol use. She reports previous drug use.  ALLERGIES: Patient has no known allergies.  MEDICATIONS:  Current Outpatient Medications  Medication Sig Dispense Refill  . enoxaparin (LOVENOX) 40 MG/0.4ML injection Inject 0.4 mLs (40 mg total) into the skin daily for 14 days. 5.6 mL 0  . gabapentin (NEURONTIN) 300 MG capsule Take 1 capsule (300 mg total) by mouth 2 (two) times daily. 60 capsule 1  . hydrOXYzine (ATARAX/VISTARIL) 10 MG tablet Take 1 tablet (10 mg total) by mouth 3 (three) times daily as needed for anxiety. 30 tablet 0  .  LORazepam (ATIVAN) 0.5 MG tablet Take 1 tablet (0.5 mg total) by mouth every 6 (six) hours as needed for anxiety. 30 tablet 0  . oxyCODONE (OXY IR/ROXICODONE) 5 MG immediate release tablet Take 1-2 tablets (5-10 mg total) by mouth every 4 (four) hours as needed for moderate  pain. 30 tablet 0  . ondansetron (ZOFRAN) 4 MG tablet Take 1 tablet (4 mg total) by mouth every 6 (six) hours as needed for nausea or vomiting. (Patient not taking: Reported on 09/10/2019) 60 tablet 0  . senna-docusate (SENOKOT-S) 8.6-50 MG tablet Take 1 tablet by mouth 2 (two) times daily. (Patient not taking: Reported on 09/10/2019) 30 tablet 1   No current facility-administered medications for this encounter.   Facility-Administered Medications Ordered in Other Encounters  Medication Dose Route Frequency Provider Last Rate Last Admin  . 0.9 %  sodium chloride infusion   Intravenous Once PRN Sindy Guadeloupe, MD   Stopped at 09/04/19 1425    ECOG PERFORMANCE STATUS:  2 - Symptomatic, <50% confined to bed  REVIEW OF SYSTEMS: Patient denies any weight loss, fatigue, weakness, fever, chills or night sweats. Patient denies any loss of vision, blurred vision. Patient denies any ringing  of the ears or hearing loss. No irregular heartbeat. Patient denies heart murmur or history of fainting. Patient denies any chest pain or pain radiating to her upper extremities. Patient denies any shortness of breath, difficulty breathing at night, cough or hemoptysis. Patient denies any swelling in the lower legs. Patient denies any nausea vomiting, vomiting of blood, or coffee ground material in the vomitus. Patient denies any stomach pain. Patient states has had normal bowel movements no significant constipation or diarrhea. Patient denies any dysuria, hematuria or significant nocturia. Patient denies any problems walking, swelling in the joints or loss of balance. Patient denies any skin changes, loss of hair or loss of weight. Patient denies any excessive worrying or anxiety or significant depression. Patient denies any problems with insomnia. Patient denies excessive thirst, polyuria, polydipsia. Patient denies any swollen glands, patient denies easy bruising or easy bleeding. Patient denies any recent infections,  allergies or URI. Patient "s visual fields have not changed significantly in recent time.   PHYSICAL EXAM: BP 117/84   Pulse (!) 107   Temp 98 F (36.7 C) (Tympanic)   Resp 16   LMP 08/13/2019 (Approximate)  Motor and sensory levels are equal and symmetric in the lower extremities.  Pain is elicited on range of motion of her right lower extremity.  No pain is elicited on deep palpation of her spine.  Well-developed well-nourished patient in NAD. HEENT reveals PERLA, EOMI, discs not visualized.  Oral cavity is clear. No oral mucosal lesions are identified. Neck is clear without evidence of cervical or supraclavicular adenopathy. Lungs are clear to A&P. Cardiac examination is essentially unremarkable with regular rate and rhythm without murmur rub or thrill. Abdomen is benign with no organomegaly or masses noted. Motor sensory and DTR levels are equal and symmetric in the upper and lower extremities. Cranial nerves II through XII are grossly intact. Proprioception is intact. No peripheral adenopathy or edema is identified. No motor or sensory levels are noted. Crude visual fields are within normal range.  LABORATORY DATA: Pathology report reviewed    RADIOLOGY RESULTS: PET scan in MRI scans all reviewed compatible with above-stated findings   IMPRESSION: Widespread metastatic breast cancer in 39 year old female ER/PR negative HER-2/neu positive to receive chemotherapy plus palliative radiation therapy and patient already receiving kyphoplasty and  multiple spinal levels as well as radiofrequency ablation.  PLAN: At this time we will try encompasses much tumor as possible in the right side including her right sacral lesion her right acetabulum and proximal femur.  Risks and benefits of treatment including possible skin reaction possible diarrhea possible increased lower urinary tract symptoms all were described in detail to the patient.  She seems to comprehend my treatment plan well.  Interpreter was  present throughout my examination.  I personally set up and ordered CT simulation.  Patient comprehends my treatment plan well.  I would like to take this opportunity to thank you for allowing me to participate in the care of your patient.Noreene Filbert, MD

## 2019-09-11 ENCOUNTER — Encounter: Payer: Self-pay | Admitting: *Deleted

## 2019-09-11 ENCOUNTER — Other Ambulatory Visit: Payer: Self-pay | Admitting: *Deleted

## 2019-09-11 ENCOUNTER — Inpatient Hospital Stay: Payer: Self-pay

## 2019-09-11 ENCOUNTER — Inpatient Hospital Stay (HOSPITAL_BASED_OUTPATIENT_CLINIC_OR_DEPARTMENT_OTHER): Payer: Self-pay | Admitting: Oncology

## 2019-09-11 ENCOUNTER — Encounter: Payer: Self-pay | Admitting: Oncology

## 2019-09-11 VITALS — BP 108/58 | HR 102 | Temp 99.3°F | Resp 18 | Wt 139.0 lb

## 2019-09-11 VITALS — BP 107/73 | HR 94 | Temp 97.5°F | Resp 18

## 2019-09-11 DIAGNOSIS — M8440XA Pathological fracture, unspecified site, initial encounter for fracture: Secondary | ICD-10-CM

## 2019-09-11 DIAGNOSIS — R945 Abnormal results of liver function studies: Secondary | ICD-10-CM

## 2019-09-11 DIAGNOSIS — C50919 Malignant neoplasm of unspecified site of unspecified female breast: Secondary | ICD-10-CM

## 2019-09-11 DIAGNOSIS — C7951 Secondary malignant neoplasm of bone: Secondary | ICD-10-CM

## 2019-09-11 DIAGNOSIS — C50912 Malignant neoplasm of unspecified site of left female breast: Secondary | ICD-10-CM

## 2019-09-11 DIAGNOSIS — R7989 Other specified abnormal findings of blood chemistry: Secondary | ICD-10-CM

## 2019-09-11 DIAGNOSIS — Z7189 Other specified counseling: Secondary | ICD-10-CM

## 2019-09-11 DIAGNOSIS — G893 Neoplasm related pain (acute) (chronic): Secondary | ICD-10-CM

## 2019-09-11 DIAGNOSIS — Z5111 Encounter for antineoplastic chemotherapy: Secondary | ICD-10-CM

## 2019-09-11 LAB — CBC WITH DIFFERENTIAL/PLATELET
Abs Immature Granulocytes: 0.26 10*3/uL — ABNORMAL HIGH (ref 0.00–0.07)
Basophils Absolute: 0 10*3/uL (ref 0.0–0.1)
Basophils Relative: 1 %
Eosinophils Absolute: 0.1 10*3/uL (ref 0.0–0.5)
Eosinophils Relative: 2 %
HCT: 30.4 % — ABNORMAL LOW (ref 36.0–46.0)
Hemoglobin: 9.9 g/dL — ABNORMAL LOW (ref 12.0–15.0)
Immature Granulocytes: 4 %
Lymphocytes Relative: 25 %
Lymphs Abs: 1.7 10*3/uL (ref 0.7–4.0)
MCH: 29.1 pg (ref 26.0–34.0)
MCHC: 32.6 g/dL (ref 30.0–36.0)
MCV: 89.4 fL (ref 80.0–100.0)
Monocytes Absolute: 0.4 10*3/uL (ref 0.1–1.0)
Monocytes Relative: 6 %
Neutro Abs: 4.5 10*3/uL (ref 1.7–7.7)
Neutrophils Relative %: 62 %
Platelets: 305 10*3/uL (ref 150–400)
RBC: 3.4 MIL/uL — ABNORMAL LOW (ref 3.87–5.11)
RDW: 14.2 % (ref 11.5–15.5)
WBC: 7 10*3/uL (ref 4.0–10.5)
nRBC: 0 % (ref 0.0–0.2)

## 2019-09-11 LAB — COMPREHENSIVE METABOLIC PANEL
ALT: 88 U/L — ABNORMAL HIGH (ref 0–44)
AST: 42 U/L — ABNORMAL HIGH (ref 15–41)
Albumin: 2.8 g/dL — ABNORMAL LOW (ref 3.5–5.0)
Alkaline Phosphatase: 171 U/L — ABNORMAL HIGH (ref 38–126)
Anion gap: 9 (ref 5–15)
BUN: 8 mg/dL (ref 6–20)
CO2: 25 mmol/L (ref 22–32)
Calcium: 8.3 mg/dL — ABNORMAL LOW (ref 8.9–10.3)
Chloride: 102 mmol/L (ref 98–111)
Creatinine, Ser: 0.45 mg/dL (ref 0.44–1.00)
GFR calc Af Amer: >60 mL/min (ref >60)
GFR calc non Af Amer: >60 mL/min (ref >60)
Glucose, Bld: 97 mg/dL (ref 70–99)
Potassium: 3.5 mmol/L (ref 3.5–5.1)
Sodium: 136 mmol/L (ref 135–145)
Total Bilirubin: 0.8 mg/dL (ref 0.3–1.2)
Total Protein: 6.5 g/dL (ref 6.5–8.1)

## 2019-09-11 MED ORDER — LORAZEPAM 0.5 MG PO TABS: 0.5000 mg | ORAL_TABLET | Freq: Four times a day (QID) | ORAL | 0 refills | Status: DC | PRN

## 2019-09-11 MED ORDER — SODIUM CHLORIDE 0.9 % IV SOLN
80.0000 mg/m2 | Freq: Once | INTRAVENOUS | Status: AC
  Administered 2019-09-11: 132 mg via INTRAVENOUS
  Filled 2019-09-11: qty 22

## 2019-09-11 MED ORDER — SODIUM CHLORIDE 0.9 % IV SOLN
Freq: Once | INTRAVENOUS | Status: AC
  Filled 2019-09-11: qty 250

## 2019-09-11 MED ORDER — SODIUM CHLORIDE 0.9 % IV SOLN
20.0000 mg | Freq: Once | INTRAVENOUS | Status: AC
  Administered 2019-09-11: 20 mg via INTRAVENOUS
  Filled 2019-09-11: qty 20

## 2019-09-11 MED ORDER — HEPARIN SOD (PORK) LOCK FLUSH 100 UNIT/ML IV SOLN
500.0000 [IU] | Freq: Once | INTRAVENOUS | Status: AC
  Administered 2019-09-11: 500 [IU] via INTRAVENOUS
  Filled 2019-09-11: qty 5

## 2019-09-11 MED ORDER — OXYCODONE HCL ER 10 MG PO T12A: 10.0000 mg | EXTENDED_RELEASE_TABLET | Freq: Two times a day (BID) | ORAL | 0 refills | Status: DC

## 2019-09-11 MED ORDER — OXYCODONE HCL 5 MG PO TABS: 5.0000 mg | ORAL_TABLET | ORAL | 0 refills | Status: DC | PRN

## 2019-09-11 MED ORDER — HEPARIN SOD (PORK) LOCK FLUSH 100 UNIT/ML IV SOLN
INTRAVENOUS | Status: AC
  Filled 2019-09-11: qty 5

## 2019-09-11 MED ORDER — FAMOTIDINE IN NACL 20-0.9 MG/50ML-% IV SOLN
20.0000 mg | Freq: Once | INTRAVENOUS | Status: AC
  Administered 2019-09-11: 20 mg via INTRAVENOUS
  Filled 2019-09-11: qty 50

## 2019-09-11 MED ORDER — DIPHENHYDRAMINE HCL 50 MG/ML IJ SOLN
50.0000 mg | Freq: Once | INTRAMUSCULAR | Status: AC
  Administered 2019-09-11: 50 mg via INTRAVENOUS
  Filled 2019-09-11: qty 1

## 2019-09-11 MED ORDER — SODIUM CHLORIDE 0.9% FLUSH
10.0000 mL | Freq: Once | INTRAVENOUS | Status: AC
  Administered 2019-09-11: 10 mL via INTRAVENOUS
  Filled 2019-09-11: qty 10

## 2019-09-11 NOTE — Progress Notes (Signed)
Talked to patient via [name], the interpreter.  Offered assistance through the [employer].  Patient tearful.  Offered support.

## 2019-09-11 NOTE — Progress Notes (Signed)
Met patient today with Sophia [name], the interpreter and Sophia Patricia, RN.  Patient with many psychosocial needs.  Offered support.  Discussed need to inform her children of her diagnosis.  Rosa trying to get counseling through [place].  Some bills to be paid through [employer].

## 2019-09-11 NOTE — Progress Notes (Signed)
Met patient today with [name], the interpreter.  We now have the patient on the [facility] for transportation.  Patient signed consent for assistance through the [employer] for possible catering, housekeeping and lawn care assistance.

## 2019-09-11 NOTE — Progress Notes (Signed)
ALT: 88. MD, Dr. Janese Banks, notified and aware. Per MD order: proceed with scheduled Taxol treatment today.

## 2019-09-13 NOTE — Progress Notes (Signed)
Hematology/Oncology Consult note [facility]  [phone] [phone]  Patient Care Team: [facility], [place], NP as PCP - General Rico Junker, RN as Registered Nurse Theodore Demark, RN as Registered Nurse   Name of the patient: Sophia [name]  [id number]  [birth year]   Date of visit: 09/13/19  Diagnosis-stage IV metastatic breast cancer ER/PR negative HER-2/neu positive with bone metastases  Chief complaint/ Reason for visit-on treatment assessment prior to cycle 1 day 1 of Taxol  Heme/Onc history: patient is a 39 year old Hispanic female who is here with her friend.  History obtained with the help of an interpreter.Patient self palpated left breast mass which was followed by a diagnostic bilateral mammogram.  Mammogram showed 3.1 x 2.9 x 1.9 cm hypoechoic mass at the 1 o'clock position of the left breast.  For abnormal cortically thickened left axillary lymph nodes measuring up to 5 mm.  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  Patient was also having ongoing back pain and was seen by [facility] orthopedics Dr. Doyle Askew who ordered MRI lumbar spine without contrast which showed possible pathologic fractures of L1 and L4 vertebral bodies with greater than 50% height loss at L1 and abnormal signal involving L2-L3 S1 as well as right iliac bone concerning for metastatic disease.  Patient is a single mother of 3 adult children and is very anxious today.  She reports significant back pain which radiates to her bilateral thighs.  Denies any focal tingling numbness or weakness.  Denies any bowel bladder incontinence.  Pain has been uncontrolled despite taking Tylenol.  No prior history of abnormal breast biopsies.  No family history of breast cancer  PET and MRI showed 3 areas of pathologic fracture of her spine as well as  widespread bony metastatic disease and concern for impending fracture of the right hip.  Given her worsening pain she was asked to come to the ER.  She has been evaluated by Dr. Rudene Christians from orthopedic surgery and underwent kyphoplasty at 3 different levels. T6 L1 and L4 along with radiofrequency ablation.    She also underwent prophylactic fixation of the right hip and not affected the sacral region.  Patient received first dose of Herceptin and Perjeta on 09/04/2019.  Baseline echocardiogram normal.  She will be starting palliative radiation as well.  Interval history-patient reports still having ongoing back pain and hip pain which is better since she went to the hospital and after surgery.  She is using oxycodone 1-2 times at night but reports pain is not still well controlled.Bowel movements have been regular.  ECOG PS- 1 Pain scale- 6 Opioid associated constipation- no  Review of systems- Review of Systems  Constitutional: Positive for malaise/fatigue. Negative for chills, fever and weight loss.  HENT: Negative for congestion, ear discharge and nosebleeds.   Eyes: Negative for blurred vision.  Respiratory: Negative for cough, hemoptysis, sputum production, shortness of breath and wheezing.   Cardiovascular: Negative for chest pain, palpitations, orthopnea and claudication.  Gastrointestinal: Negative for abdominal pain, blood in stool, constipation, diarrhea, heartburn, melena, nausea and vomiting.  Genitourinary: Negative for dysuria, flank pain, frequency, hematuria and urgency.  Musculoskeletal: Positive for back pain and joint pain. Negative for myalgias.  Skin: Negative for rash.  Neurological: Negative for dizziness, tingling, focal weakness, seizures, weakness and headaches.  Endo/Heme/Allergies: Does not bruise/bleed easily.  Psychiatric/Behavioral: Negative for depression and suicidal ideas. The patient is nervous/anxious. The patient does not have insomnia.     No Known  Allergies   Past Medical History:  Diagnosis Date  . Anxiety   . Cancer ([place])   . Vertigo      Past Surgical History:  Procedure Laterality Date  . BREAST BIOPSY Left 08/14/2019   [place] bx of mass, coil marker, path pending  . BREAST BIOPSY Left 08/14/2019   [place] bx of LN, hydromarker, path pending  . BREAST BIOPSY Left 08/14/2019   affirm bx of calcs, x marker, path pending  . INTRAMEDULLARY (IM) NAIL INTERTROCHANTERIC Right 09/01/2019   Procedure: INTRAMEDULLARY (IM) NAIL INTERTROCHANTRIC AND RADIOFREQUENCY ABLATION;  Surgeon: Hessie Knows, MD;  Location: [facility] ORS;  Service: Orthopedics;  Laterality: Right;  . KYPHOPLASTY N/A 08/29/2019   Procedure: KYPHOPLASTY T6, L1,L4 ,  RADIOFREQUENCY ABLATION;  Surgeon: Hessie Knows, MD;  Location: [facility] ORS;  Service: Orthopedics;  Laterality: N/A;  . KYPHOPLASTY Right 09/01/2019   Procedure: Right Sacral Radiofrequency Ablation and Cement Augmentation, Right sacrum and iliac crest;  Surgeon: Hessie Knows, MD;  Location: [facility] ORS;  Service: Orthopedics;  Laterality: Right;  . PORTA CATH INSERTION N/A 08/28/2019   Procedure: PORTA CATH INSERTION;  Surgeon: Algernon Huxley, MD;  Location: [facility];  Service: Cardiovascular;  Laterality: N/A;    Social History   Socioeconomic History  . Marital status: Single    Spouse name: Not on file  . Number of children: Not on file  . Years of education: Not on file  . Highest education level: Not on file  Occupational History  . Not on file  Tobacco Use  . Smoking status: Never Smoker  . Smokeless tobacco: Never Used  Vaping Use  . Vaping Use: Never used  Substance and Sexual Activity  . Alcohol use: Yes    Comment: occassionaly  . Drug use: Not Currently  . Sexual activity: Not Currently    Birth control/protection: None  Other Topics Concern  . Not on file  Social History Narrative   Lives at home with children   Social Determinants of Health   Financial Resource Strain:   .  Difficulty of Paying Living Expenses:   Food Insecurity:   . Worried About Charity fundraiser in the Last Year:   . Arboriculturist in the Last Year:   Transportation Needs:   . Film/video editor (Medical):   Marland Kitchen Lack of Transportation (Non-Medical):   Physical Activity:   . Days of Exercise per Week:   . Minutes of Exercise per Session:   Stress:   . Feeling of Stress :   Social Connections:   . Frequency of Communication with Friends and Family:   . Frequency of Social Gatherings with Friends and Family:   . Attends Religious Services:   . Active Member of Clubs or Organizations:   . Attends Archivist Meetings:   Marland Kitchen Marital Status:   Intimate Partner Violence:   . Fear of Current or Ex-Partner:   . Emotionally Abused:   Marland Kitchen Physically Abused:   . Sexually Abused:     Family History  Problem Relation Age of Onset  . Colon cancer Maternal Uncle      Current Outpatient Medications:  .  enoxaparin (LOVENOX) 40 MG/0.4ML injection, Inject 0.4 mLs (40 mg total) into the skin daily for 14 days., Disp: 5.6 mL, Rfl: 0 .  gabapentin (NEURONTIN) 300 MG capsule,  Take 1 capsule (300 mg total) by mouth 2 (two) times daily., Disp: 60 capsule, Rfl: 1 .  hydrOXYzine (ATARAX/VISTARIL) 10 MG tablet, Take 1 tablet (10 mg total) by mouth 3 (three) times daily as needed for anxiety., Disp: 30 tablet, Rfl: 0 .  lidocaine-prilocaine (EMLA) cream, Apply 1 application topically as needed. Apply small amount to port site at least 1 hour prior to it being accessed, cover with plastic wrap, Disp: 30 g, Rfl: 1 .  LORazepam (ATIVAN) 0.5 MG tablet, Take 1 tablet (0.5 mg total) by mouth every 6 (six) hours as needed for anxiety., Disp: 30 tablet, Rfl: 0 .  ondansetron (ZOFRAN) 4 MG tablet, Take 1 tablet (4 mg total) by mouth every 6 (six) hours as needed for nausea or vomiting. (Patient not taking: Reported on 09/10/2019), Disp: 60 tablet, Rfl: 0 .  oxyCODONE (OXY IR/ROXICODONE) 5 MG immediate  release tablet, Take 1-2 tablets (5-10 mg total) by mouth every 4 (four) hours as needed for moderate pain., Disp: 120 tablet, Rfl: 0 .  oxyCODONE (OXYCONTIN) 10 mg 12 hr tablet, Take 1 tablet (10 mg total) by mouth every 12 (twelve) hours., Disp: 60 tablet, Rfl: 0 .  senna-docusate (SENOKOT-S) 8.6-50 MG tablet, Take 1 tablet by mouth 2 (two) times daily. (Patient not taking: Reported on 09/10/2019), Disp: 30 tablet, Rfl: 1 No current facility-administered medications for this visit.  Facility-Administered Medications Ordered in Other Visits:  .  0.9 %  sodium chloride infusion, , Intravenous, Once PRN, Sindy Guadeloupe, MD, Stopped at 09/04/19 1425  Physical exam:  Vitals:   09/11/19 0835  BP: (!) 108/58  Pulse: (!) 102  Resp: 18  Temp: 99.3 F (37.4 C)  TempSrc: Tympanic  SpO2: 99%  Weight: 139 lb (63 kg)   Physical Exam Constitutional:      Comments: Sitting in a wheelchair appears in mild distress and nervous  Cardiovascular:     Rate and Rhythm: Regular rhythm. Tachycardia present.     Heart sounds: Normal heart sounds.  Pulmonary:     Effort: Pulmonary effort is normal.     Breath sounds: Normal breath sounds.  Abdominal:     General: Bowel sounds are normal.     Palpations: Abdomen is soft.  Skin:    General: Skin is warm and dry.  Neurological:     Mental Status: She is alert and oriented to person, place, and time.      CMP Latest Ref Rng & Units 09/11/2019  Glucose 70 - 99 mg/dL 97  BUN 6 - 20 mg/dL 8  Creatinine 0.44 - 1.00 mg/dL 0.45  Sodium 135 - 145 mmol/L 136  Potassium 3.5 - 5.1 mmol/L 3.5  Chloride 98 - 111 mmol/L 102  CO2 22 - 32 mmol/L 25  Calcium 8.9 - 10.3 mg/dL 8.3(L)  Total Protein 6.5 - 8.1 g/dL 6.5  Total Bilirubin 0.3 - 1.2 mg/dL 0.8  Alkaline Phos 38 - 126 U/L 171(H)  AST 15 - 41 U/L 42(H)  ALT 0 - 44 U/L 88(H)   CBC Latest Ref Rng & Units 09/11/2019  WBC 4.0 - 10.5 K/uL 7.0  Hemoglobin 12.0 - 15.0 g/dL 9.9(L)  Hematocrit 36 - 46 %  30.4(L)  Platelets 150 - 400 K/uL 305    No images are attached to the encounter.  DG Thoracic Spine 2 View  Result Date: 08/29/2019 CLINICAL DATA:  Vertebral augmentation. EXAM: THORACIC SPINE 2 VIEWS; DG C-ARM 1-60 MIN COMPARISON:  MRI 08/25/2019 FINDINGS: Multiple C-arm images  are provided. In the lumbar region, vertebral augmentation at L1 has a good appearance. Vertebral augmentation at the L4 level shows methylmethacrylate throughout the vertebral body, and also apparently based on this 1 projection a large amount within the spinal canal, measuring up to 9 mm in thickness and extending from the superior aspect of L4 down to the superior aspect of L5. This could result in compressive stenosis. Thoracic region imaging shows methylmethacrylate well distributed within the T6 vertebral body. There is probably some extension into the disc space. IMPRESSION: Vertebral augmentation at T6, L1 and L4. At L4, there appears to be ventral epidural cement measuring up to 9 mm in thickness. See above. Electronically Signed   By: Paulina Fusi M.D.   On: 08/29/2019 15:08   DG Sacrum/Coccyx  Result Date: 09/01/2019 CLINICAL DATA:  Metastatic disease to the sacrum and pelvic bones. EXAM: DG C-ARM 1-60 MIN; SACRUM AND COCCYX - 2+ VIEW FLUOROSCOPY TIME:  Fluoroscopy Time:  3 minutes 45 seconds COMPARISON:  PET-CT dated 08/27/2019 FINDINGS: AP and lateral C-arm images demonstrate contrast in the right sacral ala and adjacent right iliac bone which correlates with metastatic lesions visible on PET-CT. IMPRESSION: Cement augmentation of the right ilium and right sacral ala after radiofrequency ablation of metastatic disease. C-arm fluoroscopic images were obtained intraoperatively and submitted for post operative interpretation. Electronically Signed   By: Francene Boyers M.D.   On: 09/01/2019 16:02   MR Brain W and Wo Contrast  Result Date: 08/29/2019 CLINICAL DATA:  Metastatic breast cancer. EXAM: MRI HEAD WITHOUT  AND WITH CONTRAST TECHNIQUE: Multiplanar, multiecho pulse sequences of the brain and surrounding structures were obtained without and with intravenous contrast. CONTRAST:  71mL GADAVIST GADOBUTROL 1 MMOL/ML IV SOLN COMPARISON:  None. FINDINGS: Brain: There is no acute infarct, acute hemorrhage or extra-axial collection. Normal white matter signal. CSF spaces are normal. No contrast-enhancing lesions. No chronic microhemorrhage. Midline structures are normal. Vascular: Normal flow voids Skull and upper cervical spine: Normal bone marrow signal. Sinuses/Orbits: Paranasal sinuses and mastoids are clear. Normal orbits. Other: None IMPRESSION: Normal brain MRI. No evidence of intracranial metastatic disease. Electronically Signed   By: Deatra Robinson M.D.   On: 08/29/2019 02:07   MR Cervical Spine W Wo Contrast  Result Date: 08/25/2019 CLINICAL DATA:  Back pain with bilateral lower extremity radiculopathy. Breast cancer. Reported history of abnormal lumbar spine MRI EXAM: MRI CERVICAL, THORACIC AND LUMBAR SPINE WITHOUT AND WITH CONTRAST TECHNIQUE: Multiplanar and multiecho pulse sequences of the cervical spine, to include the craniocervical junction and cervicothoracic junction, and thoracic and lumbar spine, were obtained without and with intravenous contrast. CONTRAST:  6 mL Gadavist IV contrast COMPARISON:  No comparison studies are available at the time of dictation FINDINGS: MRI CERVICAL SPINE FINDINGS Alignment: No static listhesis. Straightening of the cervical lordosis. Vertebrae: Rounded enhancing 9 mm lesion within the mid C2 vertebral body. Additional enhancing lesion centered within the left posterior aspect of the C5 vertebral body measuring up to 10 mm. No enhancing soft tissue mass. No evidence of epidural involvement or extension. Cord: Normal signal and morphology. No cord expansion or focal cord lesion. Posterior Fossa, vertebral arteries, paraspinal tissues: Enhancing marrow replacing lesion involving  the left occipital condyle measuring 1.8 cm (series 6, image 14). No abnormality within the visualized posterior fossa. Paraspinal soft tissues are within normal limits. Disc levels: No significant disc protrusion, foraminal stenosis, or canal stenosis at any level. Cervical intervertebral discs remain well hydrated with preservation of disc height.  Normal cervical facet joints. MRI THORACIC SPINE FINDINGS Alignment: No static listhesis. 4 mm of bony retropulsion related to pathologic fracture of the T6 vertebrae. Vertebrae: Rounded 7 mm enhancing lesion within the anterior aspect of the T4 vertebral body. Small 2-3 mm lesion within the T5 vertebral body. Diffuse metastatic involvement of the T6 level with acute pathologic fracture where there is approximately 50% vertebral body height loss and 4 mm of bony retropulsion. Small 2-3 mm enhancing lesion near the right T8 pedicle. 9 mm enhancing lesion near the left T9 pedicle. 7 mm enhancing metastatic lesion in the anterior aspect of the T10 vertebral body. Multiple enhancing foci within the T12 vertebral body where there is a chronic appearing mild superior endplate compression fracture without residual marrow edema. No enhancing soft tissue mass. No evidence of epidural involvement or extension. There is also a enhancing expansile rib lesion involving the posterior right third rib (series 25, image 7. Rib lesion measures approximately 2.9 x 1.3 cm. Suspect metastatic lesion of the manubrium (series 26, image 12). Cord: Normal signal and morphology. No cord expansion or focal cord lesion. Paraspinal and other soft tissues: No acute findings. Disc levels: Thoracic vertebral body heights are maintained with well-hydrated discs. There is no degenerative disc or facet arthropathy of the lumbar spine. No disc protrusion. Bony retropulsion at T6 contacts the ventral cord without canal stenosis. MRI LUMBAR SPINE FINDINGS Segmentation:  Standard. Alignment: No static  listhesis. 4 mm of bony retropulsion related to pathologic fracture of the L1 vertebral body. Vertebrae: Diffuse metastatic lesion throughout the L1 vertebral body with acute pathologic compression fracture with approximately 75% vertebral body height loss centrally and 4 mm of bony retropulsion. Enhancing 9 mm lesion at the superior endplate and right pedicle of L2. A few tiny enhancing foci are evident within the L3 and L5 vertebral bodies. Large enhancing lesion throughout the L4 vertebral body with acute pathologic fracture and approximately 60% vertebral body height loss centrally. Additional areas of enhancing metastatic disease are seen throughout the sacrum, most predominantly involving the lateral right sacral ala (series 6, image 3) rounded enhancing metastatic lesion within the right ilium adjacent to the right SI joint (series 7, image 35). Conus medullaris and cauda equina: Conus extends to the L1 level. Conus and cauda equina appear unremarkable without abnormal enhancement. Paraspinal and other soft tissues: Negative. Disc levels: T12-L1: No significant disc protrusion, foraminal stenosis, or canal stenosis. Bony retropulsion of the L1 vertebral body results in mild canal stenosis. L1-L2: No significant disc protrusion, foraminal stenosis, or canal stenosis. L2-L3: No significant disc protrusion, foraminal stenosis, or canal stenosis. L3-L4: Minimal disc bulge without foraminal or canal stenosis. L4-L5: Mild disc bulge, eccentric to the left without evidence of foraminal or canal stenosis. L5-S1: Diffuse disc bulge with a superimposed small central disc protrusion contacting the ventral thecal sac without canal stenosis. Bilateral foramina remain patent. IMPRESSION: 1. 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 There is acute pathologic compression fractures of T6, L1, and L4, as described above. Mild canal stenosis related to retropulsion of the L1  vertebral body. There is no high-grade foraminal or canal stenosis at any spinal level. 2. No evidence of extraosseous spinal soft tissue mass or epidural involvement. 3. Expansile posterior right third rib metastatic lesion. 4. Additional metastatic lesions are seen within the left occipital condyle, manubrium, sacrum, and posterior right ilium. These results will be called to the ordering clinician or representative by the Radiologist Assistant, and communication  documented in the PACS or [employer]. Electronically Signed   By: Davina Poke D.O.   On: 08/25/2019 15:25   MR Thoracic Spine W Wo Contrast  Result Date: 08/25/2019 CLINICAL DATA:  Back pain with bilateral lower extremity radiculopathy. Breast cancer. Reported history of abnormal lumbar spine MRI EXAM: MRI CERVICAL, THORACIC AND LUMBAR SPINE WITHOUT AND WITH CONTRAST TECHNIQUE: Multiplanar and multiecho pulse sequences of the cervical spine, to include the craniocervical junction and cervicothoracic junction, and thoracic and lumbar spine, were obtained without and with intravenous contrast. CONTRAST:  6 mL Gadavist IV contrast COMPARISON:  No comparison studies are available at the time of dictation FINDINGS: MRI CERVICAL SPINE FINDINGS Alignment: No static listhesis. Straightening of the cervical lordosis. Vertebrae: Rounded enhancing 9 mm lesion within the mid C2 vertebral body. Additional enhancing lesion centered within the left posterior aspect of the C5 vertebral body measuring up to 10 mm. No enhancing soft tissue mass. No evidence of epidural involvement or extension. Cord: Normal signal and morphology. No cord expansion or focal cord lesion. Posterior Fossa, vertebral arteries, paraspinal tissues: Enhancing marrow replacing lesion involving the left occipital condyle measuring 1.8 cm (series 6, image 14). No abnormality within the visualized posterior fossa. Paraspinal soft tissues are within normal limits. Disc levels: No  significant disc protrusion, foraminal stenosis, or canal stenosis at any level. Cervical intervertebral discs remain well hydrated with preservation of disc height. Normal cervical facet joints. MRI THORACIC SPINE FINDINGS Alignment: No static listhesis. 4 mm of bony retropulsion related to pathologic fracture of the T6 vertebrae. Vertebrae: Rounded 7 mm enhancing lesion within the anterior aspect of the T4 vertebral body. Small 2-3 mm lesion within the T5 vertebral body. Diffuse metastatic involvement of the T6 level with acute pathologic fracture where there is approximately 50% vertebral body height loss and 4 mm of bony retropulsion. Small 2-3 mm enhancing lesion near the right T8 pedicle. 9 mm enhancing lesion near the left T9 pedicle. 7 mm enhancing metastatic lesion in the anterior aspect of the T10 vertebral body. Multiple enhancing foci within the T12 vertebral body where there is a chronic appearing mild superior endplate compression fracture without residual marrow edema. No enhancing soft tissue mass. No evidence of epidural involvement or extension. There is also a enhancing expansile rib lesion involving the posterior right third rib (series 25, image 7. Rib lesion measures approximately 2.9 x 1.3 cm. Suspect metastatic lesion of the manubrium (series 26, image 12). Cord: Normal signal and morphology. No cord expansion or focal cord lesion. Paraspinal and other soft tissues: No acute findings. Disc levels: Thoracic vertebral body heights are maintained with well-hydrated discs. There is no degenerative disc or facet arthropathy of the lumbar spine. No disc protrusion. Bony retropulsion at T6 contacts the ventral cord without canal stenosis. MRI LUMBAR SPINE FINDINGS Segmentation:  Standard. Alignment: No static listhesis. 4 mm of bony retropulsion related to pathologic fracture of the L1 vertebral body. Vertebrae: Diffuse metastatic lesion throughout the L1 vertebral body with acute pathologic  compression fracture with approximately 75% vertebral body height loss centrally and 4 mm of bony retropulsion. Enhancing 9 mm lesion at the superior endplate and right pedicle of L2. A few tiny enhancing foci are evident within the L3 and L5 vertebral bodies. Large enhancing lesion throughout the L4 vertebral body with acute pathologic fracture and approximately 60% vertebral body height loss centrally. Additional areas of enhancing metastatic disease are seen throughout the sacrum, most predominantly involving the lateral right sacral ala (series  6, image 3) rounded enhancing metastatic lesion within the right ilium adjacent to the right SI joint (series 7, image 35). Conus medullaris and cauda equina: Conus extends to the L1 level. Conus and cauda equina appear unremarkable without abnormal enhancement. Paraspinal and other soft tissues: Negative. Disc levels: T12-L1: No significant disc protrusion, foraminal stenosis, or canal stenosis. Bony retropulsion of the L1 vertebral body results in mild canal stenosis. L1-L2: No significant disc protrusion, foraminal stenosis, or canal stenosis. L2-L3: No significant disc protrusion, foraminal stenosis, or canal stenosis. L3-L4: Minimal disc bulge without foraminal or canal stenosis. L4-L5: Mild disc bulge, eccentric to the left without evidence of foraminal or canal stenosis. L5-S1: Diffuse disc bulge with a superimposed small central disc protrusion contacting the ventral thecal sac without canal stenosis. Bilateral foramina remain patent. IMPRESSION: 1. 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 There is acute pathologic compression fractures of T6, L1, and L4, as described above. Mild canal stenosis related to retropulsion of the L1 vertebral body. There is no high-grade foraminal or canal stenosis at any spinal level. 2. No evidence of extraosseous spinal soft tissue mass or epidural involvement. 3. Expansile posterior  right third rib metastatic lesion. 4. Additional metastatic lesions are seen within the left occipital condyle, manubrium, sacrum, and posterior right ilium. These results will be called to the ordering clinician or representative by the Radiologist Assistant, and communication documented in the PACS or [employer]. Electronically Signed   By: Davina Poke D.O.   On: 08/25/2019 15:25   MR Lumbar Spine W Wo Contrast  Result Date: 08/25/2019 CLINICAL DATA:  Back pain with bilateral lower extremity radiculopathy. Breast cancer. Reported history of abnormal lumbar spine MRI EXAM: MRI CERVICAL, THORACIC AND LUMBAR SPINE WITHOUT AND WITH CONTRAST TECHNIQUE: Multiplanar and multiecho pulse sequences of the cervical spine, to include the craniocervical junction and cervicothoracic junction, and thoracic and lumbar spine, were obtained without and with intravenous contrast. CONTRAST:  6 mL Gadavist IV contrast COMPARISON:  No comparison studies are available at the time of dictation FINDINGS: MRI CERVICAL SPINE FINDINGS Alignment: No static listhesis. Straightening of the cervical lordosis. Vertebrae: Rounded enhancing 9 mm lesion within the mid C2 vertebral body. Additional enhancing lesion centered within the left posterior aspect of the C5 vertebral body measuring up to 10 mm. No enhancing soft tissue mass. No evidence of epidural involvement or extension. Cord: Normal signal and morphology. No cord expansion or focal cord lesion. Posterior Fossa, vertebral arteries, paraspinal tissues: Enhancing marrow replacing lesion involving the left occipital condyle measuring 1.8 cm (series 6, image 14). No abnormality within the visualized posterior fossa. Paraspinal soft tissues are within normal limits. Disc levels: No significant disc protrusion, foraminal stenosis, or canal stenosis at any level. Cervical intervertebral discs remain well hydrated with preservation of disc height. Normal cervical facet joints. MRI  THORACIC SPINE FINDINGS Alignment: No static listhesis. 4 mm of bony retropulsion related to pathologic fracture of the T6 vertebrae. Vertebrae: Rounded 7 mm enhancing lesion within the anterior aspect of the T4 vertebral body. Small 2-3 mm lesion within the T5 vertebral body. Diffuse metastatic involvement of the T6 level with acute pathologic fracture where there is approximately 50% vertebral body height loss and 4 mm of bony retropulsion. Small 2-3 mm enhancing lesion near the right T8 pedicle. 9 mm enhancing lesion near the left T9 pedicle. 7 mm enhancing metastatic lesion in the anterior aspect of the T10 vertebral body. Multiple enhancing foci within the T12  vertebral body where there is a chronic appearing mild superior endplate compression fracture without residual marrow edema. No enhancing soft tissue mass. No evidence of epidural involvement or extension. There is also a enhancing expansile rib lesion involving the posterior right third rib (series 25, image 7. Rib lesion measures approximately 2.9 x 1.3 cm. Suspect metastatic lesion of the manubrium (series 26, image 12). Cord: Normal signal and morphology. No cord expansion or focal cord lesion. Paraspinal and other soft tissues: No acute findings. Disc levels: Thoracic vertebral body heights are maintained with well-hydrated discs. There is no degenerative disc or facet arthropathy of the lumbar spine. No disc protrusion. Bony retropulsion at T6 contacts the ventral cord without canal stenosis. MRI LUMBAR SPINE FINDINGS Segmentation:  Standard. Alignment: No static listhesis. 4 mm of bony retropulsion related to pathologic fracture of the L1 vertebral body. Vertebrae: Diffuse metastatic lesion throughout the L1 vertebral body with acute pathologic compression fracture with approximately 75% vertebral body height loss centrally and 4 mm of bony retropulsion. Enhancing 9 mm lesion at the superior endplate and right pedicle of L2. A few tiny enhancing  foci are evident within the L3 and L5 vertebral bodies. Large enhancing lesion throughout the L4 vertebral body with acute pathologic fracture and approximately 60% vertebral body height loss centrally. Additional areas of enhancing metastatic disease are seen throughout the sacrum, most predominantly involving the lateral right sacral ala (series 6, image 3) rounded enhancing metastatic lesion within the right ilium adjacent to the right SI joint (series 7, image 35). Conus medullaris and cauda equina: Conus extends to the L1 level. Conus and cauda equina appear unremarkable without abnormal enhancement. Paraspinal and other soft tissues: Negative. Disc levels: T12-L1: No significant disc protrusion, foraminal stenosis, or canal stenosis. Bony retropulsion of the L1 vertebral body results in mild canal stenosis. L1-L2: No significant disc protrusion, foraminal stenosis, or canal stenosis. L2-L3: No significant disc protrusion, foraminal stenosis, or canal stenosis. L3-L4: Minimal disc bulge without foraminal or canal stenosis. L4-L5: Mild disc bulge, eccentric to the left without evidence of foraminal or canal stenosis. L5-S1: Diffuse disc bulge with a superimposed small central disc protrusion contacting the ventral thecal sac without canal stenosis. Bilateral foramina remain patent. IMPRESSION: 1. 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 There is acute pathologic compression fractures of T6, L1, and L4, as described above. Mild canal stenosis related to retropulsion of the L1 vertebral body. There is no high-grade foraminal or canal stenosis at any spinal level. 2. No evidence of extraosseous spinal soft tissue mass or epidural involvement. 3. Expansile posterior right third rib metastatic lesion. 4. Additional metastatic lesions are seen within the left occipital condyle, manubrium, sacrum, and posterior right ilium. These results will be called to the ordering  clinician or representative by the Radiologist Assistant, and communication documented in the PACS or [employer]. Electronically Signed   By: Davina Poke D.O.   On: 08/25/2019 15:25   PERIPHERAL VASCULAR CATHETERIZATION  Result Date: 08/28/2019 See op note  NM PET Image Initial (PI) Skull Base To Thigh  Addendum Date: 08/27/2019   ADDENDUM REPORT: 08/27/2019 17:27 ADDENDUM: These results will be called to the ordering clinician or representative by the Radiologist Assistant, and communication documented in the PACS or [employer]. Electronically Signed   By: Zetta Bills M.D.   On: 08/27/2019 17:27   Result Date: 08/27/2019 CLINICAL DATA:  Initial treatment strategy for breast cancer. EXAM: NUCLEAR MEDICINE PET SKULL BASE TO THIGH  TECHNIQUE: 7.05 mCi F-18 FDG was injected intravenously. Full-ring PET imaging was performed from the skull base to thigh after the radiotracer. CT data was obtained and used for attenuation correction and anatomic localization. Fasting blood glucose: 78 mg/dl COMPARISON:  Spinal evaluations of 08/25/2019 FINDINGS: Mediastinal blood pool activity: SUV max 2.72 Liver activity: SUV max not applicable NECK: Asymmetric tonsillar activity RIGHT palatine tonsils approximately select approximately SUV 10.4, on the LEFT 4.5. No hypermetabolic lymph nodes in the neck, see below for thoracic inlet lymph nodes. Incidental CT findings: none CHEST: LEFT supraclavicular lymph node (image 55 of series 4) (SUVmax = 3.7 LEFT axillary lymph node (image 60, series 4) 7 mm (SUVmax = 5.1 with similar size and metabolically active lymph nodes in the area at least 7 additional lymph nodes. Smaller mildly hypermetabolic lymph nodes tracking towards the LEFT supraclavicular region under LEFT subpectoral musculature with a tiny lymph node with the maximum SUV of 2.5 seen on image 57 of series 4 measuring 4 mm. See below for rib lesions.) Asymmetric soft tissue in the LEFT breast and marker  for biopsy in an area with a maximum SUV of 7.9 that measures approximately 2.7 cm maximal dimension (image 80, series 4) Contralateral axillary lymph node (SUVmax = 2.6 (image 65, series 4) 6 mm.) Incidental CT findings: Mild calcified atheromatous plaque in the thoracic aorta. Heart size normal without pericardial effusion. Central pulmonary vasculature is normal caliber. Signs of basilar atelectasis question of background ground-glass but no consolidation, no pleural effusion or suspicious pulmonary nodule. ABDOMEN/PELVIS: No abnormal hypermetabolic activity within the liver, pancreas, adrenal glands, or spleen. No hypermetabolic lymph nodes in the abdomen or pelvis. Bilateral uptake in the adnexa along the expected course of the LEFT and RIGHT ureter is slightly more diffuse than expected for ureteral related activity but given bilateral appearance this is suspected Incidental CT findings: Cystic structures in the RIGHT adnexa without FDG uptake likely ovarian cysts largest measuring approximately 2.7 x 2.4 cm. RIGHT intrarenal calculus. Interpolar RIGHT kidney 4 mm. No acute gastrointestinal process. The appendix is normal. SKELETON: Diffuse skeletal metastatic disease. LEFT C1/skull base lesion with soft tissue at the foramen magnum (image 17, series 4) (SUVmax = 9.2 soft tissue in this area measuring approximately 12 x 12 mm and extending into the occipital condyle on the LEFT likely and also involving C1. C2 metastatic focus markedly hypermetabolic (SUVmax = 9.1 associated with vertebral body destruction measuring approximately 9 x 6 mm. Destructive changes at the C5 level also with marked hypermetabolic activity. Lesion associated with vertebral body destruction at T5-T6 showing marked hypermetabolic activity better demonstrated on the thoracic spine MRI. Pathologic fracture also at the L1 level and with adjacent destruction of L2 is associated with hypermetabolic activity as are multiple other foci in the  lumbar spine. Lesion without CT correlate in the RIGHT proximal humerus. Destructive lesion involving the RIGHT second rib (SUVmax = 13.6 signs of metastasis to the sternal body, LEFT anterior third rib, RIGHT hemi sacrum, bilateral iliac bones, LEFT ischium and RIGHT femoral neck with smaller focus in the LEFT femoral shaft. Of particular concern are lesions with bone destruction at the first sacral level within the sacral ala measuring 2.8 x 1.9 cm (SUVmax = 11.5 similar activity with some bone destruction along the sacroiliac joint measuring 1.4 by 0.8 cm adjacent to the sacral lesion. Another area of considerable concern is the cortical destruction and soft tissue associated with RIGHT femoral neck (image 216, series 4) (SUVmax = 12.9) This  is followed by the diffuse activity and cortical destruction noted in the LEFT iliac bone which is associated with extensive periosteal reaction extending from the ischium through the iliac and associated with a small fracture in the nonweightbearing portion of the iliac bone but just above the LEFT acetabulum. (SUVmax = 13.7.) ) Incidental CT findings: none IMPRESSION: 1. 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 Redemonstration of metastatic, pathologic fractures associated with multiple sites of metastatic disease throughout the spine. 2. Sites of concern for future pathologic fracture of the RIGHT sacral ala and RIGHT proximal femur. 3. Pathologic fracture along the anterior aspect of the LEFT iliac bone just above the acetabulum with diffuse iliac and ischial 4. Asymmetric tonsillar/base of tongue activity in the neck is of uncertain significance. Direct clinical inspection or close attention on follow-up is suggested. 5. LEFT breast activity and adenopathy as discussed. 6. Area of activity in the bilateral adnexa favored to represent distal ureters. Attention on follow-up. Correlation could be made with contrasted imaging of the  chest, abdomen and pelvis. Electronically Signed: By: Zetta Bills M.D. On: 08/27/2019 17:23   DG C-Arm 1-60 Min  Result Date: 09/01/2019 CLINICAL DATA:  Intramedullary nail insertion right femur secondary to metastatic disease and pinning fracture. EXAM: DG C-ARM 1-60 MIN; RIGHT FEMUR 2 VIEWS FLUOROSCOPY TIME:  Fluoroscopy Time:  3 minutes 45 seconds COMPARISON:  CT dated 08/27/2019 FINDINGS: C-arm images demonstrate insertion of intramedullary nail and proximal and distal screws in the right femur. The hardware appears in good position. Fracture. IMPRESSION: Satisfactory appearance of the right femur after intramedullary nail insertion. C-arm fluoroscopic images were obtained intraoperatively and submitted for post operative interpretation. Electronically Signed   By: Lorriane Shire M.D.   On: 09/01/2019 16:05   DG C-Arm 1-60 Min  Result Date: 09/01/2019 CLINICAL DATA:  Metastatic disease to the sacrum and pelvic bones. EXAM: DG C-ARM 1-60 MIN; SACRUM AND COCCYX - 2+ VIEW FLUOROSCOPY TIME:  Fluoroscopy Time:  3 minutes 45 seconds COMPARISON:  PET-CT dated 08/27/2019 FINDINGS: AP and lateral C-arm images demonstrate contrast in the right sacral ala and adjacent right iliac bone which correlates with metastatic lesions visible on PET-CT. IMPRESSION: Cement augmentation of the right ilium and right sacral ala after radiofrequency ablation of metastatic disease. C-arm fluoroscopic images were obtained intraoperatively and submitted for post operative interpretation. Electronically Signed   By: Lorriane Shire M.D.   On: 09/01/2019 16:02   DG C-Arm 1-60 Min  Result Date: 08/29/2019 CLINICAL DATA:  Vertebral augmentation. EXAM: THORACIC SPINE 2 VIEWS; DG C-ARM 1-60 MIN COMPARISON:  MRI 08/25/2019 FINDINGS: Multiple C-arm images are provided. In the lumbar region, vertebral augmentation at L1 has a good appearance. Vertebral augmentation at the L4 level shows methylmethacrylate throughout the vertebral body,  and also apparently based on this 1 projection a large amount within the spinal canal, measuring up to 9 mm in thickness and extending from the superior aspect of L4 down to the superior aspect of L5. This could result in compressive stenosis. Thoracic region imaging shows methylmethacrylate well distributed within the T6 vertebral body. There is probably some extension into the disc space. IMPRESSION: Vertebral augmentation at T6, L1 and L4. At L4, there appears to be ventral epidural cement measuring up to 9 mm in thickness. See above. Electronically Signed   By: Nelson Chimes M.D.   On: 08/29/2019 15:08   DG C-Arm 1-60 Min  Result Date: 08/29/2019 CLINICAL DATA:  Vertebral augmentation. EXAM: THORACIC  SPINE 2 VIEWS; DG C-ARM 1-60 MIN COMPARISON:  MRI 08/25/2019 FINDINGS: Multiple C-arm images are provided. In the lumbar region, vertebral augmentation at L1 has a good appearance. Vertebral augmentation at the L4 level shows methylmethacrylate throughout the vertebral body, and also apparently based on this 1 projection a large amount within the spinal canal, measuring up to 9 mm in thickness and extending from the superior aspect of L4 down to the superior aspect of L5. This could result in compressive stenosis. Thoracic region imaging shows methylmethacrylate well distributed within the T6 vertebral body. There is probably some extension into the disc space. IMPRESSION: Vertebral augmentation at T6, L1 and L4. At L4, there appears to be ventral epidural cement measuring up to 9 mm in thickness. See above. Electronically Signed   By: Nelson Chimes M.D.   On: 08/29/2019 15:08   ECHOCARDIOGRAM COMPLETE  Result Date: 08/26/2019    ECHOCARDIOGRAM REPORT   Patient Name:   Sophia [name] [facility] Date of Exam: 08/26/2019 Medical Rec #:  [id number]             Height:       61.0 in Accession #:    [id number]            Weight:       139.2 lb Date of Birth:  Oct 02, 1980             BSA:          1.619 m Patient Age:    69  years              BP:           112/54 mmHg Patient Gender: F                     HR:           94 bpm. Exam Location:  [facility] Procedure: 2D Echo, Color Doppler and Cardiac Doppler Indications:     v58.11 Chemotherapy evaluation  History:         Patient has no prior history of Echocardiogram examinations.                  Vertigo.  Sonographer:     Charmayne Sheer RDCS (AE) Referring Phys:  [id number] Weston Anna Breniyah Romm Diagnosing Phys: Harrell Gave End MD  Sonographer Comments: Suboptimal subcostal window. IMPRESSIONS  1. Left ventricular ejection fraction, by estimation, is 65 to 70%. The left ventricle has normal function. The left ventricle has no regional wall motion abnormalities. Left ventricular diastolic parameters are indeterminate.  2. Right ventricular systolic function is normal. The right ventricular size is normal.  3. The mitral valve is normal in structure. No evidence of mitral valve regurgitation. No evidence of mitral stenosis.  4. The aortic valve is grossly normal. Aortic valve regurgitation is not visualized. No aortic stenosis is present.  5. The inferior vena cava is normal in size with greater than 50% respiratory variability, suggesting right atrial pressure of 3 mmHg. FINDINGS  Left Ventricle: Left ventricular ejection fraction, by estimation, is 65 to 70%. The left ventricle has normal function. The left ventricle has no regional wall motion abnormalities. The left ventricular internal cavity size was normal in size. There is  no left ventricular hypertrophy. Left ventricular diastolic parameters are indeterminate. Right Ventricle: The right ventricular size is normal. No increase in right ventricular wall thickness. Right ventricular systolic function is normal. Left Atrium: Left atrial size was normal in size. Right Atrium:  Right atrial size was not well visualized. Pericardium: The pericardium was not well visualized. Mitral Valve: The mitral valve is normal in structure. No evidence of mitral  valve regurgitation. No evidence of mitral valve stenosis. MV peak gradient, 3.2 mmHg. The mean mitral valve gradient is 1.0 mmHg. Tricuspid Valve: The tricuspid valve is not well visualized. Tricuspid valve regurgitation is not demonstrated. Aortic Valve: The aortic valve is grossly normal. Aortic valve regurgitation is not visualized. No aortic stenosis is present. Aortic valve mean gradient measures 3.0 mmHg. Aortic valve peak gradient measures 5.3 mmHg. Aortic valve area, by VTI measures 1.60 cm. Pulmonic Valve: The pulmonic valve was not well visualized. Pulmonic valve regurgitation is not visualized. No evidence of pulmonic stenosis. Aorta: The aortic root is normal in size and structure. Pulmonary Artery: The pulmonary artery is of normal size. Venous: The inferior vena cava is normal in size with greater than 50% respiratory variability, suggesting right atrial pressure of 3 mmHg. IAS/Shunts: The interatrial septum was not well visualized.  LEFT VENTRICLE PLAX 2D LVIDd:         4.21 cm  Diastology LVIDs:         2.58 cm  LV e' lateral:   9.25 cm/s LV PW:         0.82 cm  LV E/e' lateral: 6.7 LV IVS:        0.69 cm  LV e' medial:    7.51 cm/s LVOT diam:     1.60 cm  LV E/e' medial:  8.2 LV SV:         29 LV SV Index:   18 LVOT Area:     2.01 cm  LEFT ATRIUM             Index LA diam:        2.60 cm 1.61 cm/m LA Vol (A2C):   20.5 ml 12.66 ml/m LA Vol (A4C):   29.3 ml 18.10 ml/m LA Biplane Vol: 26.1 ml 16.12 ml/m  AORTIC VALVE                   PULMONIC VALVE AV Area (Vmax):    1.74 cm    PV Vmax:       1.08 m/s AV Area (Vmean):   1.69 cm    PV Vmean:      76.800 cm/s AV Area (VTI):     1.60 cm    PV VTI:        0.202 m AV Vmax:           115.00 cm/s PV Peak grad:  4.7 mmHg AV Vmean:          75.100 cm/s PV Mean grad:  3.0 mmHg AV VTI:            0.179 m AV Peak Grad:      5.3 mmHg AV Mean Grad:      3.0 mmHg LVOT Vmax:         99.80 cm/s LVOT Vmean:        63.000 cm/s LVOT VTI:          0.142 m  LVOT/AV VTI ratio: 0.79  AORTA Ao Root diam: 2.60 cm MITRAL VALVE MV Area (PHT): 6.17 cm    SHUNTS MV Peak grad:  3.2 mmHg    Systemic VTI:  0.14 m MV Mean grad:  1.0 mmHg    Systemic Diam: 1.60 cm MV Vmax:       0.89 m/s MV Vmean:  58.1 cm/s MV Decel Time: 123 msec MV E velocity: 61.60 cm/s MV A velocity: 77.60 cm/s MV E/A ratio:  0.79 Nelva Bush MD Electronically signed by Nelva Bush MD Signature Date/Time: 08/26/2019/1:28:27 PM    Final    MS DIGITAL DIAG UNI LEFT  Addendum Date: 08/20/2019   ADDENDUM REPORT: 08/20/2019 09:06 ADDENDUM: PATHOLOGY revealed: A. BREAST, LEFT AT 1:00; ULTRASOUND-GUIDED CORE NEEDLE BIOPSY: - INVASIVE MAMMARY CARCINOMA, NO SPECIAL TYPE. At least 9 mm in this sample. Grade 2. Ductal carcinoma in situ: Not identified. Lymphovascular invasion: Not identified. B. AXILLA, LEFT; ULTRASOUND-GUIDED CORE NEEDLE BIOPSY: - INVASIVE MAMMARY CARCINOMA, NO SPECIAL TYPE. Comment: Invasive carcinoma within this specimen measures approximately 1 mm in greatest extent, and is histologically similar to tumor identified in part A. No definitive lymph node tissue is identified. C. BREAST, LEFT UPPER OUTER QUADRANT; STEREOTACTIC CORE NEEDLE 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 CARCINOMA IN SITU, HIGH GRADE WITH COMEDONECROSIS, WITH ASSOCIATED CALCIFICATIONS. Comment: The specimen consists predominantly of ductal carcinoma in situ, present in 7 of 8 tissue blocks, and measuring up to 2 mm in greatest linear extent. DCIS is associated with calcifications. Invasive carcinoma is identified within a single block (C7), measuring 1 mm in greatest extent, and is histologically similar to tumor seen in parts A and B. Hormone receptor testing will be limited to specimen A. Pathology results are CONCORDANT with imaging findings, per Dr. Audie Pinto. Breast MRI is recommended for extent of disease. Multiple attempts to contact patient, using [employer], were  unsuccessful. Notified Tanya Nones RN at [facility] (who is patient's Furniture conservator/restorer) and she will contact patient with biopsy results and arrange surgical referral. Addendum by Electa Sniff RN on 08/20/2019. Electronically Signed   By: Audie Pinto M.D.   On: 08/20/2019 09:06   Result Date: 08/20/2019 CLINICAL DATA:  39 year old female presenting for biopsy of a left breast mass, left breast calcifications and a left axillary lymph node. EXAM: DIAGNOSTIC LEFT MAMMOGRAM POST ULTRASOUND AND STEREOTACTIC BIOPSIES COMPARISON:  Previous exam(s). FINDINGS: Mammographic images were obtained following ultrasound guided biopsy of a left breast mass at 1 o'clock. The coil biopsy marking clip is in expected position at the site of biopsy. Mammographic images were obtained following ultrasound guided biopsy of a left axillary lymph node. The [facility] biopsy marking clip is in expected position at the site of biopsy. Mammographic images were obtained following stereotactic guided biopsy of calcifications. The X biopsy marking clip is in expected position at the site of biopsy. IMPRESSION: Appropriate positioning of the coil shaped biopsy marking clip at the site of biopsy in the left breast 1 o'clock Appropriate positioning of the X shaped biopsy marking clip at the site of biopsy in the upper outer left breast. Appropriate positioning of the [facility] shaped biopsy marking clip at the site of biopsy in the left axilla. Final Assessment: Post Procedure Mammograms for Marker Placement Electronically Signed: By: Audie Pinto M.D. On: 08/14/2019 09:36   DG Hip Unilat W or Wo Pelvis 2-3 Views Right  Result Date: 08/28/2019 CLINICAL DATA:  Right hip pain. History of metastatic breast cancer. EXAM: DG HIP (WITH OR WITHOUT PELVIS) 2-3V RIGHT COMPARISON:  None. FINDINGS: There is no evidence of hip fracture or dislocation. There is no evidence of arthropathy. A 4 mm round sclerotic focus is seen  within the inter trochanteric region of the proximal right femur. IMPRESSION: Small sclerotic focus within the proximal right femur which may represent a  small bone island. Given the patient's history of metastatic breast cancer, a small metastatic focus cannot be excluded. Electronically Signed   By: Virgina Norfolk M.D.   On: 08/28/2019 21:01   DG FEMUR, MIN 2 VIEWS RIGHT  Result Date: 09/01/2019 CLINICAL DATA:  Intramedullary nail insertion right femur secondary to metastatic disease and pinning fracture. EXAM: DG C-ARM 1-60 MIN; RIGHT FEMUR 2 VIEWS FLUOROSCOPY TIME:  Fluoroscopy Time:  3 minutes 45 seconds COMPARISON:  CT dated 08/27/2019 FINDINGS: C-arm images demonstrate insertion of intramedullary nail and proximal and distal screws in the right femur. The hardware appears in good position. Fracture. IMPRESSION: Satisfactory appearance of the right femur after intramedullary nail insertion. C-arm fluoroscopic images were obtained intraoperatively and submitted for post operative interpretation. Electronically Signed   By: Lorriane Shire M.D.   On: 09/01/2019 16:05   MM LT BREAST BX W LOC DEV 1ST LESION IMAGE BX SPEC STEREO GUIDE  Addendum Date: 08/20/2019   ADDENDUM REPORT: 08/20/2019 09:06 ADDENDUM: PATHOLOGY revealed: A. BREAST, LEFT AT 1:00; ULTRASOUND-GUIDED CORE NEEDLE BIOPSY: - INVASIVE MAMMARY CARCINOMA, NO SPECIAL TYPE. At least 9 mm in this sample. Grade 2. Ductal carcinoma in situ: Not identified. Lymphovascular invasion: Not identified. B. AXILLA, LEFT; ULTRASOUND-GUIDED CORE NEEDLE BIOPSY: - INVASIVE MAMMARY CARCINOMA, NO SPECIAL TYPE. Comment: Invasive carcinoma within this specimen measures approximately 1 mm in greatest extent, and is histologically similar to tumor identified in part A. No definitive lymph node tissue is identified. C. BREAST, LEFT UPPER OUTER QUADRANT; STEREOTACTIC CORE NEEDLE 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 CARCINOMA IN SITU, HIGH  GRADE WITH COMEDONECROSIS, WITH ASSOCIATED CALCIFICATIONS. Comment: The specimen consists predominantly of ductal carcinoma in situ, present in 7 of 8 tissue blocks, and measuring up to 2 mm in greatest linear extent. DCIS is associated with calcifications. Invasive carcinoma is identified within a single block (C7), measuring 1 mm in greatest extent, and is histologically similar to tumor seen in parts A and B. Hormone receptor testing will be limited to specimen A. Pathology results are CONCORDANT with imaging findings, per Dr. Audie Pinto. Breast MRI is recommended for extent of disease. Multiple attempts to contact patient, using [employer], were unsuccessful. Notified Tanya Nones RN at [facility] (who is patient's Furniture conservator/restorer) and she will contact patient with biopsy results and arrange surgical referral. Addendum by Electa Sniff RN on 08/20/2019. Electronically Signed   By: Audie Pinto M.D.   On: 08/20/2019 09:06   Result Date: 08/20/2019 CLINICAL DATA:  39 year old female presenting for biopsy of a left breast mass, left axillary lymph node and left breast calcifications. EXAM: LEFT BREAST STEREOTACTIC CORE NEEDLE BIOPSY COMPARISON:  Previous exams. FINDINGS: The patient and I discussed the procedure of stereotactic-guided biopsy including benefits and alternatives. We discussed the high likelihood of a successful procedure. We discussed the risks of the procedure including infection, bleeding, tissue injury, clip migration, and inadequate sampling. Informed written consent was given. The usual time out protocol was performed immediately prior to the procedure. Using sterile technique and 1% Lidocaine as local anesthetic, under stereotactic guidance, a 9 gauge vacuum assisted device was used to perform core needle biopsy of calcifications in the upper outer left breast using a superior approach. Specimen radiograph was performed showing at least 4 specimens with  calcifications. Specimens with calcifications are identified for pathology. Lesion quadrant: Upper outer quadrant At the conclusion of the procedure, an X tissue marker clip was deployed into the biopsy cavity. Follow-up  2-view mammogram was performed and dictated separately. IMPRESSION: Stereotactic-guided biopsy of calcifications in the upper outer left breast. No apparent complications. Electronically Signed: By: Audie Pinto M.D. On: 08/14/2019 09:43   [place] LT BREAST BX W LOC DEV 1ST LESION IMG BX SPEC US GUIDE  Addendum Date: 08/20/2019   ADDENDUM REPORT: 08/20/2019 09:04 ADDENDUM: PATHOLOGY revealed: A. BREAST, LEFT AT 1:00; ULTRASOUND-GUIDED CORE NEEDLE BIOPSY: - INVASIVE MAMMARY CARCINOMA, NO SPECIAL TYPE. At least 9 mm in this sample. Grade 2. Ductal carcinoma in situ: Not identified. Lymphovascular invasion: Not identified. B. AXILLA, LEFT; ULTRASOUND-GUIDED CORE NEEDLE BIOPSY: - INVASIVE MAMMARY CARCINOMA, NO SPECIAL TYPE. Comment: Invasive carcinoma within this specimen measures approximately 1 mm in greatest extent, and is histologically similar to tumor identified in part A. No definitive lymph node tissue is identified. C. BREAST, LEFT UPPER OUTER QUADRANT; STEREOTACTIC CORE NEEDLE 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 CARCINOMA IN SITU, HIGH GRADE WITH COMEDONECROSIS, WITH ASSOCIATED CALCIFICATIONS. Comment: The specimen consists predominantly of ductal carcinoma in situ, present in 7 of 8 tissue blocks, and measuring up to 2 mm in greatest linear extent. DCIS is associated with calcifications. Invasive carcinoma is identified within a single block (C7), measuring 1 mm in greatest extent, and is histologically similar to tumor seen in parts A and B. Hormone receptor testing will be limited to specimen A. Pathology results are CONCORDANT with imaging findings, per Dr. Audie Pinto. Breast MRI is recommended for extent of disease. Multiple attempts to contact  patient, using [employer], were unsuccessful. Notified Tanya Nones RN at [facility] (who is patient's Furniture conservator/restorer) and she will contact patient with biopsy results and arrange surgical referral. Addendum by Electa Sniff RN on 08/20/2019. Electronically Signed   By: Audie Pinto M.D.   On: 08/20/2019 09:04   Result Date: 08/20/2019 CLINICAL DATA:  39 year old female presenting for biopsy of a left breast mass, left axillary lymph node and left breast calcifications. EXAM: ULTRASOUND GUIDED LEFT BREAST CORE NEEDLE BIOPSY [place] AXILLARY NODE CORE BIOPSY LEFT COMPARISON:  Previous exam(s). PROCEDURE: I met with the patient and we discussed the procedure of ultrasound-guided biopsy, including benefits and alternatives. We discussed the high likelihood of a successful procedure. We discussed the risks of the procedure, including infection, bleeding, tissue injury, clip migration, and inadequate sampling. Informed written consent was given. The usual time-out protocol was performed immediately prior to the procedure. 1.  Lesion quadrant: Upper outer quadrant Using sterile technique and 1% Lidocaine as local anesthetic, under direct ultrasound visualization, a 12 gauge spring-loaded device was used to perform biopsy of a mass in the left breast at 1 o'clock using a lateral approach. At the conclusion of the procedure a coil tissue marker clip was deployed into the biopsy cavity. Follow up 2 view mammogram was performed and dictated separately. 2.  Axilla Using sterile technique and 1% Lidocaine as local anesthetic, under direct ultrasound visualization, a 14 gauge spring-loaded device was used to perform biopsy of a left axillary lymph node using a lateral approach. At the conclusion of the procedure a HydroMARK tissue marker clip was deployed into the biopsy cavity. Follow up 2 view mammogram was performed and dictated separately. IMPRESSION: Ultrasound guided biopsy of a left breast  mass at 1 o'clock and a left axillary lymph node. No apparent complications. Electronically Signed: By: Audie Pinto M.D. On: 08/14/2019 09:42   [place] LT BREAST BX W LOC DEV EA ADD LESION IMG BX SPEC US GUIDE  Addendum Date: 08/20/2019   ADDENDUM REPORT: 08/20/2019 09:04 ADDENDUM: PATHOLOGY revealed: A. BREAST, LEFT AT 1:00; ULTRASOUND-GUIDED CORE NEEDLE BIOPSY: - INVASIVE MAMMARY CARCINOMA, NO SPECIAL TYPE. At least 9 mm in this sample. Grade 2. Ductal carcinoma in situ: Not identified. Lymphovascular invasion: Not identified. B. AXILLA, LEFT; ULTRASOUND-GUIDED CORE NEEDLE BIOPSY: - INVASIVE MAMMARY CARCINOMA, NO SPECIAL TYPE. Comment: Invasive carcinoma within this specimen measures approximately 1 mm in greatest extent, and is histologically similar to tumor identified in part A. No definitive lymph node tissue is identified. C. BREAST, LEFT UPPER OUTER QUADRANT; STEREOTACTIC CORE NEEDLE 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 CARCINOMA IN SITU, HIGH GRADE WITH COMEDONECROSIS, WITH ASSOCIATED CALCIFICATIONS. Comment: The specimen consists predominantly of ductal carcinoma in situ, present in 7 of 8 tissue blocks, and measuring up to 2 mm in greatest linear extent. DCIS is associated with calcifications. Invasive carcinoma is identified within a single block (C7), measuring 1 mm in greatest extent, and is histologically similar to tumor seen in parts A and B. Hormone receptor testing will be limited to specimen A. Pathology results are CONCORDANT with imaging findings, per Dr. Audie Pinto. Breast MRI is recommended for extent of disease. Multiple attempts to contact patient, using [employer], were unsuccessful. Notified Tanya Nones RN at [facility] (who is patient's Furniture conservator/restorer) and she will contact patient with biopsy results and arrange surgical referral. Addendum by Electa Sniff RN on 08/20/2019. Electronically Signed   By: Audie Pinto  M.D.   On: 08/20/2019 09:04   Result Date: 08/20/2019 CLINICAL DATA:  39 year old female presenting for biopsy of a left breast mass, left axillary lymph node and left breast calcifications. EXAM: ULTRASOUND GUIDED LEFT BREAST CORE NEEDLE BIOPSY [place] AXILLARY NODE CORE BIOPSY LEFT COMPARISON:  Previous exam(s). PROCEDURE: I met with the patient and we discussed the procedure of ultrasound-guided biopsy, including benefits and alternatives. We discussed the high likelihood of a successful procedure. We discussed the risks of the procedure, including infection, bleeding, tissue injury, clip migration, and inadequate sampling. Informed written consent was given. The usual time-out protocol was performed immediately prior to the procedure. 1.  Lesion quadrant: Upper outer quadrant Using sterile technique and 1% Lidocaine as local anesthetic, under direct ultrasound visualization, a 12 gauge spring-loaded device was used to perform biopsy of a mass in the left breast at 1 o'clock using a lateral approach. At the conclusion of the procedure a coil tissue marker clip was deployed into the biopsy cavity. Follow up 2 view mammogram was performed and dictated separately. 2.  Axilla Using sterile technique and 1% Lidocaine as local anesthetic, under direct ultrasound visualization, a 14 gauge spring-loaded device was used to perform biopsy of a left axillary lymph node using a lateral approach. At the conclusion of the procedure a HydroMARK tissue marker clip was deployed into the biopsy cavity. Follow up 2 view mammogram was performed and dictated separately. IMPRESSION: Ultrasound guided biopsy of a left breast mass at 1 o'clock and a left axillary lymph node. No apparent complications. Electronically Signed: By: Audie Pinto M.D. On: 08/14/2019 09:42     Assessment and plan- Patient is a 39 y.o. 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  She is here for on  treatment assessment prior to cycle 1 of Taxol  Patient received cycle 1 of Herceptin and Perjeta on 09/04/2019.  This was given to her as an inpatient.  She will receive her  first dose of weekly Taxol today.  She has mildly abnormal  LFTs which we will continue to monitor. Also noted to have mild anemia.  Continue to monitor suspect this is secondary to recent surgery continue to give her treatments during her palliative radiation if her counts permit.  She is young and has aggressive disease with widespread bone metastases.  Neoplasm related pain: I will add OxyContin 10 mg twice a day and continue oxycodone 5 mg 1 to 2 tablets every 4 hours as needed  Anxiety: Continue as needed Ativan  Bone metastases: S/p kyphoplasty and prophylactic fixation of the right hip.  She would also be undergoing palliative radiation treatment I will discuss bisphosphonates with her after completion of radiation  I will see her next week for dose 2 of Taxol.  She will be receiving Herceptin and Perjeta every 3 weeks until progression or toxicity  NGS testing has also been sent out and is currently pending   Visit Diagnosis 1. Pain from bone metastases (HCC)   2. Encounter for antineoplastic chemotherapy   3. Abnormal LFTs   4. Neoplasm related pain   5. Goals of care, counseling/discussion   6. Metastatic breast cancer [facility])      Dr. Randa Evens, MD, MPH [facility] at [facility] [phone] 09/13/2019 7:19 AM

## 2019-09-15 ENCOUNTER — Ambulatory Visit
Admission: RE | Admit: 2019-09-15 | Discharge: 2019-09-15 | Disposition: A | Discharge status: Home / Self Care | Payer: Self-pay | Source: Ambulatory Visit | Attending: Radiation Oncology | Admitting: Radiation Oncology

## 2019-09-15 DIAGNOSIS — C7951 Secondary malignant neoplasm of bone: Secondary | ICD-10-CM | POA: Insufficient documentation

## 2019-09-15 DIAGNOSIS — C50919 Malignant neoplasm of unspecified site of unspecified female breast: Secondary | ICD-10-CM | POA: Insufficient documentation

## 2019-09-15 DIAGNOSIS — Z171 Estrogen receptor negative status [ER-]: Secondary | ICD-10-CM | POA: Insufficient documentation

## 2019-09-17 ENCOUNTER — Encounter: Payer: Self-pay | Admitting: *Deleted

## 2019-09-17 ENCOUNTER — Ambulatory Visit
Admission: RE | Admit: 2019-09-17 | Discharge: 2019-09-17 | Disposition: A | Discharge status: Home / Self Care | Payer: Self-pay | Source: Ambulatory Visit | Attending: Radiation Oncology | Admitting: Radiation Oncology

## 2019-09-17 ENCOUNTER — Inpatient Hospital Stay: Payer: Self-pay

## 2019-09-18 ENCOUNTER — Encounter: Payer: Self-pay | Admitting: *Deleted

## 2019-09-18 ENCOUNTER — Other Ambulatory Visit: Payer: Self-pay

## 2019-09-18 ENCOUNTER — Inpatient Hospital Stay: Payer: Self-pay

## 2019-09-18 ENCOUNTER — Ambulatory Visit
Admission: RE | Admit: 2019-09-18 | Discharge: 2019-09-18 | Disposition: A | Discharge status: Home / Self Care | Payer: Self-pay | Source: Ambulatory Visit | Attending: Radiation Oncology | Admitting: Radiation Oncology

## 2019-09-18 ENCOUNTER — Ambulatory Visit: Payer: Self-pay

## 2019-09-18 ENCOUNTER — Ambulatory Visit: Payer: Self-pay | Admitting: Oncology

## 2019-09-18 DIAGNOSIS — G893 Neoplasm related pain (acute) (chronic): Secondary | ICD-10-CM | POA: Insufficient documentation

## 2019-09-18 DIAGNOSIS — Z171 Estrogen receptor negative status [ER-]: Secondary | ICD-10-CM | POA: Insufficient documentation

## 2019-09-18 DIAGNOSIS — R945 Abnormal results of liver function studies: Secondary | ICD-10-CM | POA: Insufficient documentation

## 2019-09-18 DIAGNOSIS — F419 Anxiety disorder, unspecified: Secondary | ICD-10-CM | POA: Insufficient documentation

## 2019-09-18 DIAGNOSIS — C7951 Secondary malignant neoplasm of bone: Secondary | ICD-10-CM | POA: Insufficient documentation

## 2019-09-18 DIAGNOSIS — K529 Noninfective gastroenteritis and colitis, unspecified: Secondary | ICD-10-CM | POA: Insufficient documentation

## 2019-09-18 DIAGNOSIS — C50912 Malignant neoplasm of unspecified site of left female breast: Secondary | ICD-10-CM | POA: Insufficient documentation

## 2019-09-18 DIAGNOSIS — Z7901 Long term (current) use of anticoagulants: Secondary | ICD-10-CM | POA: Insufficient documentation

## 2019-09-18 DIAGNOSIS — Z5112 Encounter for antineoplastic immunotherapy: Secondary | ICD-10-CM | POA: Insufficient documentation

## 2019-09-18 DIAGNOSIS — C50919 Malignant neoplasm of unspecified site of unspecified female breast: Secondary | ICD-10-CM | POA: Insufficient documentation

## 2019-09-18 DIAGNOSIS — R5381 Other malaise: Secondary | ICD-10-CM | POA: Insufficient documentation

## 2019-09-18 DIAGNOSIS — E876 Hypokalemia: Secondary | ICD-10-CM | POA: Insufficient documentation

## 2019-09-18 DIAGNOSIS — Z5111 Encounter for antineoplastic chemotherapy: Secondary | ICD-10-CM | POA: Insufficient documentation

## 2019-09-18 DIAGNOSIS — R5383 Other fatigue: Secondary | ICD-10-CM | POA: Insufficient documentation

## 2019-09-18 DIAGNOSIS — T451X5A Adverse effect of antineoplastic and immunosuppressive drugs, initial encounter: Secondary | ICD-10-CM | POA: Insufficient documentation

## 2019-09-18 DIAGNOSIS — Z79899 Other long term (current) drug therapy: Secondary | ICD-10-CM | POA: Insufficient documentation

## 2019-09-18 DIAGNOSIS — D6481 Anemia due to antineoplastic chemotherapy: Secondary | ICD-10-CM | POA: Insufficient documentation

## 2019-09-18 DIAGNOSIS — R42 Dizziness and giddiness: Secondary | ICD-10-CM | POA: Insufficient documentation

## 2019-09-18 DIAGNOSIS — M549 Dorsalgia, unspecified: Secondary | ICD-10-CM | POA: Insufficient documentation

## 2019-09-19 ENCOUNTER — Inpatient Hospital Stay: Payer: Self-pay

## 2019-09-19 ENCOUNTER — Other Ambulatory Visit: Payer: Self-pay

## 2019-09-19 ENCOUNTER — Ambulatory Visit
Admission: RE | Admit: 2019-09-19 | Discharge: 2019-09-19 | Disposition: A | Discharge status: Home / Self Care | Payer: Self-pay | Source: Ambulatory Visit | Attending: Radiation Oncology | Admitting: Radiation Oncology

## 2019-09-19 ENCOUNTER — Inpatient Hospital Stay (HOSPITAL_BASED_OUTPATIENT_CLINIC_OR_DEPARTMENT_OTHER): Payer: Self-pay | Admitting: Oncology

## 2019-09-19 ENCOUNTER — Encounter: Payer: Self-pay | Admitting: Oncology

## 2019-09-19 VITALS — BP 109/72 | HR 98 | Resp 16

## 2019-09-19 VITALS — BP 104/76 | HR 100 | Temp 98.8°F | Resp 16 | Ht 61.0 in | Wt 137.0 lb

## 2019-09-19 DIAGNOSIS — Z5111 Encounter for antineoplastic chemotherapy: Secondary | ICD-10-CM

## 2019-09-19 DIAGNOSIS — R945 Abnormal results of liver function studies: Secondary | ICD-10-CM

## 2019-09-19 DIAGNOSIS — M8440XA Pathological fracture, unspecified site, initial encounter for fracture: Secondary | ICD-10-CM

## 2019-09-19 DIAGNOSIS — C50919 Malignant neoplasm of unspecified site of unspecified female breast: Secondary | ICD-10-CM

## 2019-09-19 DIAGNOSIS — C7951 Secondary malignant neoplasm of bone: Secondary | ICD-10-CM

## 2019-09-19 DIAGNOSIS — G893 Neoplasm related pain (acute) (chronic): Secondary | ICD-10-CM

## 2019-09-19 DIAGNOSIS — R7989 Other specified abnormal findings of blood chemistry: Secondary | ICD-10-CM

## 2019-09-19 DIAGNOSIS — C50912 Malignant neoplasm of unspecified site of left female breast: Secondary | ICD-10-CM

## 2019-09-19 LAB — CBC WITH DIFFERENTIAL/PLATELET
Abs Immature Granulocytes: 0.2 10*3/uL — ABNORMAL HIGH (ref 0.00–0.07)
Basophils Absolute: 0 10*3/uL (ref 0.0–0.1)
Basophils Relative: 1 %
Eosinophils Absolute: 0.1 10*3/uL (ref 0.0–0.5)
Eosinophils Relative: 2 %
HCT: 30.2 % — ABNORMAL LOW (ref 36.0–46.0)
Hemoglobin: 9.8 g/dL — ABNORMAL LOW (ref 12.0–15.0)
Immature Granulocytes: 5 %
Lymphocytes Relative: 26 %
Lymphs Abs: 1.1 10*3/uL (ref 0.7–4.0)
MCH: 29 pg (ref 26.0–34.0)
MCHC: 32.5 g/dL (ref 30.0–36.0)
MCV: 89.3 fL (ref 80.0–100.0)
Monocytes Absolute: 0.4 10*3/uL (ref 0.1–1.0)
Monocytes Relative: 9 %
Neutro Abs: 2.4 10*3/uL (ref 1.7–7.7)
Neutrophils Relative %: 57 %
Platelets: 469 10*3/uL — ABNORMAL HIGH (ref 150–400)
RBC: 3.38 MIL/uL — ABNORMAL LOW (ref 3.87–5.11)
RDW: 14.5 % (ref 11.5–15.5)
WBC: 4.1 10*3/uL (ref 4.0–10.5)
nRBC: 0 % (ref 0.0–0.2)

## 2019-09-19 LAB — COMPREHENSIVE METABOLIC PANEL
ALT: 85 U/L — ABNORMAL HIGH (ref 0–44)
AST: 46 U/L — ABNORMAL HIGH (ref 15–41)
Albumin: 3.1 g/dL — ABNORMAL LOW (ref 3.5–5.0)
Alkaline Phosphatase: 291 U/L — ABNORMAL HIGH (ref 38–126)
Anion gap: 8 (ref 5–15)
BUN: 6 mg/dL (ref 6–20)
CO2: 26 mmol/L (ref 22–32)
Calcium: 8.5 mg/dL — ABNORMAL LOW (ref 8.9–10.3)
Chloride: 102 mmol/L (ref 98–111)
Creatinine, Ser: 0.46 mg/dL (ref 0.44–1.00)
GFR calc Af Amer: >60 mL/min (ref >60)
GFR calc non Af Amer: >60 mL/min (ref >60)
Glucose, Bld: 106 mg/dL — ABNORMAL HIGH (ref 70–99)
Potassium: 3.5 mmol/L (ref 3.5–5.1)
Sodium: 136 mmol/L (ref 135–145)
Total Bilirubin: 0.7 mg/dL (ref 0.3–1.2)
Total Protein: 7.1 g/dL (ref 6.5–8.1)

## 2019-09-19 MED ORDER — SODIUM CHLORIDE 0.9 % IV SOLN: INTRAVENOUS | Status: DC

## 2019-09-19 MED ORDER — FAMOTIDINE IN NACL 20-0.9 MG/50ML-% IV SOLN
20.0000 mg | Freq: Once | INTRAVENOUS | Status: AC
  Administered 2019-09-19: 20 mg via INTRAVENOUS
  Filled 2019-09-19: qty 50

## 2019-09-19 MED ORDER — HEPARIN SOD (PORK) LOCK FLUSH 100 UNIT/ML IV SOLN
INTRAVENOUS | Status: AC
  Filled 2019-09-19: qty 5

## 2019-09-19 MED ORDER — SODIUM CHLORIDE 0.9% FLUSH
10.0000 mL | Freq: Once | INTRAVENOUS | Status: AC
  Administered 2019-09-19: 10 mL via INTRAVENOUS
  Filled 2019-09-19: qty 10

## 2019-09-19 MED ORDER — SODIUM CHLORIDE 0.9 % IV SOLN
80.0000 mg/m2 | Freq: Once | INTRAVENOUS | Status: AC
  Administered 2019-09-19: 132 mg via INTRAVENOUS
  Filled 2019-09-19: qty 22

## 2019-09-19 MED ORDER — HEPARIN SOD (PORK) LOCK FLUSH 100 UNIT/ML IV SOLN
500.0000 [IU] | Freq: Once | INTRAVENOUS | Status: AC
  Administered 2019-09-19: 500 [IU] via INTRAVENOUS
  Filled 2019-09-19: qty 5

## 2019-09-19 MED ORDER — SODIUM CHLORIDE 0.9 % IV SOLN
Freq: Once | INTRAVENOUS | Status: AC
  Filled 2019-09-19: qty 250

## 2019-09-19 MED ORDER — SODIUM CHLORIDE 0.9 % IV SOLN
20.0000 mg | Freq: Once | INTRAVENOUS | Status: AC
  Administered 2019-09-19: 20 mg via INTRAVENOUS
  Filled 2019-09-19: qty 20

## 2019-09-19 MED ORDER — DIPHENHYDRAMINE HCL 50 MG/ML IJ SOLN
50.0000 mg | Freq: Once | INTRAMUSCULAR | Status: AC
  Administered 2019-09-19: 50 mg via INTRAVENOUS
  Filled 2019-09-19: qty 1

## 2019-09-19 NOTE — Progress Notes (Signed)
Hematology/Oncology Consult note [facility]  [phone] [phone]  Patient Care Team: [place], [place], NP as PCP - General Rico Junker, RN as Registered Nurse Theodore Demark, RN as Registered Nurse   Name of the patient: Sophia [name]  [id number]  04-02-1980   Date of visit: 09/19/19  Diagnosis- stage IV metastatic breast cancer ER/PR negative HER-2/neu positive with bone metastases   Chief complaint/ Reason for visit-on treatment assessment prior to dose 2 of Taxol  Heme/Onc history: patient is a 39 year old Hispanic female who is here with her friend. History obtained with the help of an interpreter.Patient self palpated left breast mass which was followed by a diagnostic bilateral mammogram. Mammogram showed 3.1 x 2.9 x 1.9 cm hypoechoic mass at the 1 o'clock position of the left breast. For abnormal cortically thickened left axillary lymph nodes measuring up to 5 mm. 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 Patient was also having ongoing back pain and was seen by [facility] orthopedics Dr. Doyle Askew who ordered MRI lumbar spine without contrast which showed possible pathologic fractures of L1 and L4 vertebral bodies with greater than 50% height loss at L1 and abnormal signal involving L2-L3 S1 as well as right iliac bone concerning for metastatic disease.Patient is a single mother of 3 adult children and is very anxious today.She reports significant back pain which radiates to her bilateral thighs. Denies any focal tingling numbness or weakness. Denies any bowel bladder incontinence. Pain has been uncontrolled despite taking Tylenol. No prior history of abnormal breast biopsies. No family history of breast cancer  PET and MRI showed 3 areas of pathologic fracture of her spine as well as  widespread bony metastatic disease and concern for impending fracture of the right hip. Given her worsening pain she was asked to come to the ER. She has been evaluated by Dr. Rudene Christians from orthopedic surgery and underwent kyphoplasty at 3 different levels.T6 L1 and L4 along with radiofrequency ablation.   She also underwent prophylactic fixation of the right hip and not affected the sacral region.  Patient received first dose of Herceptin and Perjeta on 09/04/2019.  Baseline echocardiogram normal.   Interval history-history obtained with the help of interpreter.  Reports that she still has back pain but it is relatively well controlled with OxyContin and oxycodone.  She is also on gabapentin 300 mg twice a day.  She started her palliative radiation this week and will continue through next week.  She is able to carry on her ADLs with some assistance.  She was living with her friend for 2 weeks and now is back to living in her own house with her 3 children.  Does not report constipation.  In fact has intermittent diarrhea for which she takes as needed Imodium  ECOG PS- 2 Pain scale- 0 Opioid associated constipation- no  Review of systems- Review of Systems  Constitutional: Positive for malaise/fatigue. Negative for chills, fever and weight loss.  HENT: Negative for congestion, ear discharge and nosebleeds.   Eyes: Negative for blurred vision.  Respiratory: Negative for cough, hemoptysis, sputum production, shortness of breath and wheezing.   Cardiovascular: Negative for chest pain, palpitations, orthopnea and claudication.  Gastrointestinal: Negative for abdominal pain, blood in stool, constipation, diarrhea, heartburn, melena, nausea and vomiting.  Genitourinary: Negative for dysuria, flank pain, frequency, hematuria and urgency.  Musculoskeletal: Positive for back pain. Negative for joint pain and myalgias.  Skin: Negative for rash.  Neurological: Negative for dizziness, tingling, focal  weakness, seizures, weakness and headaches.  Endo/Heme/Allergies: Does not bruise/bleed easily.  Psychiatric/Behavioral: Negative for depression and suicidal ideas. The patient does not have insomnia.       No Known Allergies   Past Medical History:  Diagnosis Date  . Anxiety   . Cancer ([place])   . Vertigo      Past Surgical History:  Procedure Laterality Date  . BREAST BIOPSY Left 08/14/2019   [place] bx of mass, coil marker, path pending  . BREAST BIOPSY Left 08/14/2019   [place] bx of LN, hydromarker, path pending  . BREAST BIOPSY Left 08/14/2019   affirm bx of calcs, x marker, path pending  . INTRAMEDULLARY (IM) NAIL INTERTROCHANTERIC Right 09/01/2019   Procedure: INTRAMEDULLARY (IM) NAIL INTERTROCHANTRIC AND RADIOFREQUENCY ABLATION;  Surgeon: Hessie Knows, MD;  Location: [facility] ORS;  Service: Orthopedics;  Laterality: Right;  . KYPHOPLASTY N/A 08/29/2019   Procedure: KYPHOPLASTY T6, L1,L4 ,  RADIOFREQUENCY ABLATION;  Surgeon: Hessie Knows, MD;  Location: [facility] ORS;  Service: Orthopedics;  Laterality: N/A;  . KYPHOPLASTY Right 09/01/2019   Procedure: Right Sacral Radiofrequency Ablation and Cement Augmentation, Right sacrum and iliac crest;  Surgeon: Hessie Knows, MD;  Location: [facility] ORS;  Service: Orthopedics;  Laterality: Right;  . PORTA CATH INSERTION N/A 08/28/2019   Procedure: PORTA CATH INSERTION;  Surgeon: Algernon Huxley, MD;  Location: [place] [facility];  Service: Cardiovascular;  Laterality: N/A;    Social History   Socioeconomic History  . Marital status: Single    Spouse name: Not on file  . Number of children: Not on file  . Years of education: Not on file  . Highest education level: Not on file  Occupational History  . Not on file  Tobacco Use  . Smoking status: Never Smoker  . Smokeless tobacco: Never Used  Vaping Use  . Vaping Use: Never used  Substance and Sexual Activity  . Alcohol use: Yes    Comment: occassionaly  . Drug use: Not Currently  . Sexual  activity: Not Currently    Birth control/protection: None  Other Topics Concern  . Not on file  Social History Narrative   Lives at home with children   Social Determinants of Health   Financial Resource Strain:   . Difficulty of Paying Living Expenses:   Food Insecurity:   . Worried About Charity fundraiser in the Last Year:   . Arboriculturist in the Last Year:   Transportation Needs:   . Film/video editor (Medical):   Marland Kitchen Lack of Transportation (Non-Medical):   Physical Activity:   . Days of Exercise per Week:   . Minutes of Exercise per Session:   Stress:   . Feeling of Stress :   Social Connections:   . Frequency of Communication with Friends and Family:   . Frequency of Social Gatherings with Friends and Family:   . Attends Religious Services:   . Active Member of Clubs or Organizations:   . Attends Archivist Meetings:   Marland Kitchen Marital Status:   Intimate Partner Violence:   . Fear of Current or Ex-Partner:   . Emotionally Abused:   Marland Kitchen Physically Abused:   . Sexually Abused:     Family History  Problem Relation Age of Onset  . Colon cancer Maternal Uncle      Current Outpatient Medications:  .  enoxaparin (LOVENOX) 40 MG/0.4ML injection, Inject 0.4 mLs (40 mg total) into the skin daily for 14 days., Disp: 5.6 mL, Rfl: 0 .  gabapentin (NEURONTIN) 300 MG capsule, Take 1 capsule (300 mg total) by mouth 2 (two) times daily., Disp: 60 capsule, Rfl: 1 .  hydrOXYzine (ATARAX/VISTARIL) 10 MG tablet, Take 1 tablet (10 mg total) by mouth 3 (three) times daily as needed for anxiety., Disp: 30 tablet, Rfl: 0 .  lidocaine-prilocaine (EMLA) cream, Apply 1 application topically as needed. Apply small amount to port site at least 1 hour prior to it being accessed, cover with plastic wrap, Disp: 30 g, Rfl: 1 .  LORazepam (ATIVAN) 0.5 MG tablet, Take 1 tablet (0.5 mg total) by mouth every 6 (six) hours as needed for anxiety., Disp: 30 tablet, Rfl: 0 .  ondansetron (ZOFRAN)  4 MG tablet, Take 1 tablet (4 mg total) by mouth every 6 (six) hours as needed for nausea or vomiting. (Patient not taking: Reported on 09/10/2019), Disp: 60 tablet, Rfl: 0 .  oxyCODONE (OXY IR/ROXICODONE) 5 MG immediate release tablet, Take 1-2 tablets (5-10 mg total) by mouth every 4 (four) hours as needed for moderate pain., Disp: 120 tablet, Rfl: 0 .  oxyCODONE (OXYCONTIN) 10 mg 12 hr tablet, Take 1 tablet (10 mg total) by mouth every 12 (twelve) hours., Disp: 60 tablet, Rfl: 0 .  senna-docusate (SENOKOT-S) 8.6-50 MG tablet, Take 1 tablet by mouth 2 (two) times daily. (Patient not taking: Reported on 09/10/2019), Disp: 30 tablet, Rfl: 1 No current facility-administered medications for this visit.  Facility-Administered Medications Ordered in Other Visits:  .  0.9 %  sodium chloride infusion, , Intravenous, Once PRN, Sindy Guadeloupe, MD, Stopped at 09/04/19 1425  Physical exam:  Vitals:   09/19/19 0917  BP: 104/76  Pulse: 100  Resp: 16  Temp: 98.8 F (37.1 C)  TempSrc: Oral  SpO2: 99%  Weight: 137 lb (62.1 kg)  Height: '5\' 1"'$  (1.549 m)   Physical Exam Cardiovascular:     Rate and Rhythm: Normal rate and regular rhythm.     Heart sounds: Normal heart sounds.  Pulmonary:     Effort: Pulmonary effort is normal.     Breath sounds: Normal breath sounds.  Abdominal:     General: Bowel sounds are normal.     Palpations: Abdomen is soft.  Skin:    General: Skin is warm and dry.  Neurological:     Mental Status: She is alert and oriented to person, place, and time.      CMP Latest Ref Rng & Units 09/19/2019  Glucose 70 - 99 mg/dL 106(H)  BUN 6 - 20 mg/dL 6  Creatinine 0.44 - 1.00 mg/dL 0.46  Sodium 135 - 145 mmol/L 136  Potassium 3.5 - 5.1 mmol/L 3.5  Chloride 98 - 111 mmol/L 102  CO2 22 - 32 mmol/L 26  Calcium 8.9 - 10.3 mg/dL 8.5(L)  Total Protein 6.5 - 8.1 g/dL 7.1  Total Bilirubin 0.3 - 1.2 mg/dL 0.7  Alkaline Phos 38 - 126 U/L 291(H)  AST 15 - 41 U/L 46(H)  ALT 0 - 44 U/L  85(H)   CBC Latest Ref Rng & Units 09/19/2019  WBC 4.0 - 10.5 K/uL 4.1  Hemoglobin 12.0 - 15.0 g/dL 9.8(L)  Hematocrit 36 - 46 % 30.2(L)  Platelets 150 - 400 K/uL 469(H)    No images are attached to the encounter.  DG Thoracic Spine 2 View  Result Date: 08/29/2019 CLINICAL DATA:  Vertebral augmentation.  EXAM: THORACIC SPINE 2 VIEWS; DG C-ARM 1-60 MIN COMPARISON:  MRI 08/25/2019 FINDINGS: Multiple C-arm images are provided. In the lumbar region, vertebral augmentation at L1 has a good appearance. Vertebral augmentation at the L4 level shows methylmethacrylate throughout the vertebral body, and also apparently based on this 1 projection a large amount within the spinal canal, measuring up to 9 mm in thickness and extending from the superior aspect of L4 down to the superior aspect of L5. This could result in compressive stenosis. Thoracic region imaging shows methylmethacrylate well distributed within the T6 vertebral body. There is probably some extension into the disc space. IMPRESSION: Vertebral augmentation at T6, L1 and L4. At L4, there appears to be ventral epidural cement measuring up to 9 mm in thickness. See above. Electronically Signed   By: Nelson Chimes M.D.   On: 08/29/2019 15:08   DG Sacrum/Coccyx  Result Date: 09/01/2019 CLINICAL DATA:  Metastatic disease to the sacrum and pelvic bones. EXAM: DG C-ARM 1-60 MIN; SACRUM AND COCCYX - 2+ VIEW FLUOROSCOPY TIME:  Fluoroscopy Time:  3 minutes 45 seconds COMPARISON:  PET-CT dated 08/27/2019 FINDINGS: AP and lateral C-arm images demonstrate contrast in the right sacral ala and adjacent right iliac bone which correlates with metastatic lesions visible on PET-CT. IMPRESSION: Cement augmentation of the right ilium and right sacral ala after radiofrequency ablation of metastatic disease. C-arm fluoroscopic images were obtained intraoperatively and submitted for post operative interpretation. Electronically Signed   By: Lorriane Shire M.D.   On:  09/01/2019 16:02   MR Brain W and Wo Contrast  Result Date: 08/29/2019 CLINICAL DATA:  Metastatic breast cancer. EXAM: MRI HEAD WITHOUT AND WITH CONTRAST TECHNIQUE: Multiplanar, multiecho pulse sequences of the brain and surrounding structures were obtained without and with intravenous contrast. CONTRAST:  49m GADAVIST GADOBUTROL 1 MMOL/ML IV SOLN COMPARISON:  None. FINDINGS: Brain: There is no acute infarct, acute hemorrhage or extra-axial collection. Normal white matter signal. CSF spaces are normal. No contrast-enhancing lesions. No chronic microhemorrhage. Midline structures are normal. Vascular: Normal flow voids Skull and upper cervical spine: Normal bone marrow signal. Sinuses/Orbits: Paranasal sinuses and mastoids are clear. Normal orbits. Other: None IMPRESSION: Normal brain MRI. No evidence of intracranial metastatic disease. Electronically Signed   By: KUlyses JarredM.D.   On: 08/29/2019 02:07   MR Cervical Spine W Wo Contrast  Result Date: 08/25/2019 CLINICAL DATA:  Back pain with bilateral lower extremity radiculopathy. Breast cancer. Reported history of abnormal lumbar spine MRI EXAM: MRI CERVICAL, THORACIC AND LUMBAR SPINE WITHOUT AND WITH CONTRAST TECHNIQUE: Multiplanar and multiecho pulse sequences of the cervical spine, to include the craniocervical junction and cervicothoracic junction, and thoracic and lumbar spine, were obtained without and with intravenous contrast. CONTRAST:  6 mL Gadavist IV contrast COMPARISON:  No comparison studies are available at the time of dictation FINDINGS: MRI CERVICAL SPINE FINDINGS Alignment: No static listhesis. Straightening of the cervical lordosis. Vertebrae: Rounded enhancing 9 mm lesion within the mid C2 vertebral body. Additional enhancing lesion centered within the left posterior aspect of the C5 vertebral body measuring up to 10 mm. No enhancing soft tissue mass. No evidence of epidural involvement or extension. Cord: Normal signal and morphology.  No cord expansion or focal cord lesion. Posterior Fossa, vertebral arteries, paraspinal tissues: Enhancing marrow replacing lesion involving the left occipital condyle measuring 1.8 cm (series 6, image 14). No abnormality within the visualized posterior fossa. Paraspinal soft tissues are within normal limits. Disc levels: No significant disc protrusion, foraminal stenosis,  or canal stenosis at any level. Cervical intervertebral discs remain well hydrated with preservation of disc height. Normal cervical facet joints. MRI THORACIC SPINE FINDINGS Alignment: No static listhesis. 4 mm of bony retropulsion related to pathologic fracture of the T6 vertebrae. Vertebrae: Rounded 7 mm enhancing lesion within the anterior aspect of the T4 vertebral body. Small 2-3 mm lesion within the T5 vertebral body. Diffuse metastatic involvement of the T6 level with acute pathologic fracture where there is approximately 50% vertebral body height loss and 4 mm of bony retropulsion. Small 2-3 mm enhancing lesion near the right T8 pedicle. 9 mm enhancing lesion near the left T9 pedicle. 7 mm enhancing metastatic lesion in the anterior aspect of the T10 vertebral body. Multiple enhancing foci within the T12 vertebral body where there is a chronic appearing mild superior endplate compression fracture without residual marrow edema. No enhancing soft tissue mass. No evidence of epidural involvement or extension. There is also a enhancing expansile rib lesion involving the posterior right third rib (series 25, image 7. Rib lesion measures approximately 2.9 x 1.3 cm. Suspect metastatic lesion of the manubrium (series 26, image 12). Cord: Normal signal and morphology. No cord expansion or focal cord lesion. Paraspinal and other soft tissues: No acute findings. Disc levels: Thoracic vertebral body heights are maintained with well-hydrated discs. There is no degenerative disc or facet arthropathy of the lumbar spine. No disc protrusion. Bony  retropulsion at T6 contacts the ventral cord without canal stenosis. MRI LUMBAR SPINE FINDINGS Segmentation:  Standard. Alignment: No static listhesis. 4 mm of bony retropulsion related to pathologic fracture of the L1 vertebral body. Vertebrae: Diffuse metastatic lesion throughout the L1 vertebral body with acute pathologic compression fracture with approximately 75% vertebral body height loss centrally and 4 mm of bony retropulsion. Enhancing 9 mm lesion at the superior endplate and right pedicle of L2. A few tiny enhancing foci are evident within the L3 and L5 vertebral bodies. Large enhancing lesion throughout the L4 vertebral body with acute pathologic fracture and approximately 60% vertebral body height loss centrally. Additional areas of enhancing metastatic disease are seen throughout the sacrum, most predominantly involving the lateral right sacral ala (series 6, image 3) rounded enhancing metastatic lesion within the right ilium adjacent to the right SI joint (series 7, image 35). Conus medullaris and cauda equina: Conus extends to the L1 level. Conus and cauda equina appear unremarkable without abnormal enhancement. Paraspinal and other soft tissues: Negative. Disc levels: T12-L1: No significant disc protrusion, foraminal stenosis, or canal stenosis. Bony retropulsion of the L1 vertebral body results in mild canal stenosis. L1-L2: No significant disc protrusion, foraminal stenosis, or canal stenosis. L2-L3: No significant disc protrusion, foraminal stenosis, or canal stenosis. L3-L4: Minimal disc bulge without foraminal or canal stenosis. L4-L5: Mild disc bulge, eccentric to the left without evidence of foraminal or canal stenosis. L5-S1: Diffuse disc bulge with a superimposed small central disc protrusion contacting the ventral thecal sac without canal stenosis. Bilateral foramina remain patent. IMPRESSION: 1. 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  There is acute pathologic compression fractures of T6, L1, and L4, as described above. Mild canal stenosis related to retropulsion of the L1 vertebral body. There is no high-grade foraminal or canal stenosis at any spinal level. 2. No evidence of extraosseous spinal soft tissue mass or epidural involvement. 3. Expansile posterior right third rib metastatic lesion. 4. Additional metastatic lesions are seen within the left occipital condyle, manubrium, sacrum, and posterior right ilium.  These results will be called to the ordering clinician or representative by the Radiologist Assistant, and communication documented in the PACS or [employer]. Electronically Signed   By: Davina Poke D.O.   On: 08/25/2019 15:25   MR Thoracic Spine W Wo Contrast  Result Date: 08/25/2019 CLINICAL DATA:  Back pain with bilateral lower extremity radiculopathy. Breast cancer. Reported history of abnormal lumbar spine MRI EXAM: MRI CERVICAL, THORACIC AND LUMBAR SPINE WITHOUT AND WITH CONTRAST TECHNIQUE: Multiplanar and multiecho pulse sequences of the cervical spine, to include the craniocervical junction and cervicothoracic junction, and thoracic and lumbar spine, were obtained without and with intravenous contrast. CONTRAST:  6 mL Gadavist IV contrast COMPARISON:  No comparison studies are available at the time of dictation FINDINGS: MRI CERVICAL SPINE FINDINGS Alignment: No static listhesis. Straightening of the cervical lordosis. Vertebrae: Rounded enhancing 9 mm lesion within the mid C2 vertebral body. Additional enhancing lesion centered within the left posterior aspect of the C5 vertebral body measuring up to 10 mm. No enhancing soft tissue mass. No evidence of epidural involvement or extension. Cord: Normal signal and morphology. No cord expansion or focal cord lesion. Posterior Fossa, vertebral arteries, paraspinal tissues: Enhancing marrow replacing lesion involving the left occipital condyle measuring 1.8 cm (series  6, image 14). No abnormality within the visualized posterior fossa. Paraspinal soft tissues are within normal limits. Disc levels: No significant disc protrusion, foraminal stenosis, or canal stenosis at any level. Cervical intervertebral discs remain well hydrated with preservation of disc height. Normal cervical facet joints. MRI THORACIC SPINE FINDINGS Alignment: No static listhesis. 4 mm of bony retropulsion related to pathologic fracture of the T6 vertebrae. Vertebrae: Rounded 7 mm enhancing lesion within the anterior aspect of the T4 vertebral body. Small 2-3 mm lesion within the T5 vertebral body. Diffuse metastatic involvement of the T6 level with acute pathologic fracture where there is approximately 50% vertebral body height loss and 4 mm of bony retropulsion. Small 2-3 mm enhancing lesion near the right T8 pedicle. 9 mm enhancing lesion near the left T9 pedicle. 7 mm enhancing metastatic lesion in the anterior aspect of the T10 vertebral body. Multiple enhancing foci within the T12 vertebral body where there is a chronic appearing mild superior endplate compression fracture without residual marrow edema. No enhancing soft tissue mass. No evidence of epidural involvement or extension. There is also a enhancing expansile rib lesion involving the posterior right third rib (series 25, image 7. Rib lesion measures approximately 2.9 x 1.3 cm. Suspect metastatic lesion of the manubrium (series 26, image 12). Cord: Normal signal and morphology. No cord expansion or focal cord lesion. Paraspinal and other soft tissues: No acute findings. Disc levels: Thoracic vertebral body heights are maintained with well-hydrated discs. There is no degenerative disc or facet arthropathy of the lumbar spine. No disc protrusion. Bony retropulsion at T6 contacts the ventral cord without canal stenosis. MRI LUMBAR SPINE FINDINGS Segmentation:  Standard. Alignment: No static listhesis. 4 mm of bony retropulsion related to pathologic  fracture of the L1 vertebral body. Vertebrae: Diffuse metastatic lesion throughout the L1 vertebral body with acute pathologic compression fracture with approximately 75% vertebral body height loss centrally and 4 mm of bony retropulsion. Enhancing 9 mm lesion at the superior endplate and right pedicle of L2. A few tiny enhancing foci are evident within the L3 and L5 vertebral bodies. Large enhancing lesion throughout the L4 vertebral body with acute pathologic fracture and approximately 60% vertebral body height loss centrally. Additional areas of  enhancing metastatic disease are seen throughout the sacrum, most predominantly involving the lateral right sacral ala (series 6, image 3) rounded enhancing metastatic lesion within the right ilium adjacent to the right SI joint (series 7, image 35). Conus medullaris and cauda equina: Conus extends to the L1 level. Conus and cauda equina appear unremarkable without abnormal enhancement. Paraspinal and other soft tissues: Negative. Disc levels: T12-L1: No significant disc protrusion, foraminal stenosis, or canal stenosis. Bony retropulsion of the L1 vertebral body results in mild canal stenosis. L1-L2: No significant disc protrusion, foraminal stenosis, or canal stenosis. L2-L3: No significant disc protrusion, foraminal stenosis, or canal stenosis. L3-L4: Minimal disc bulge without foraminal or canal stenosis. L4-L5: Mild disc bulge, eccentric to the left without evidence of foraminal or canal stenosis. L5-S1: Diffuse disc bulge with a superimposed small central disc protrusion contacting the ventral thecal sac without canal stenosis. Bilateral foramina remain patent. IMPRESSION: 1. 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 There is acute pathologic compression fractures of T6, L1, and L4, as described above. Mild canal stenosis related to retropulsion of the L1 vertebral body. There is no high-grade foraminal or canal  stenosis at any spinal level. 2. No evidence of extraosseous spinal soft tissue mass or epidural involvement. 3. Expansile posterior right third rib metastatic lesion. 4. Additional metastatic lesions are seen within the left occipital condyle, manubrium, sacrum, and posterior right ilium. These results will be called to the ordering clinician or representative by the Radiologist Assistant, and communication documented in the PACS or [employer]. Electronically Signed   By: Davina Poke D.O.   On: 08/25/2019 15:25   MR Lumbar Spine W Wo Contrast  Result Date: 08/25/2019 CLINICAL DATA:  Back pain with bilateral lower extremity radiculopathy. Breast cancer. Reported history of abnormal lumbar spine MRI EXAM: MRI CERVICAL, THORACIC AND LUMBAR SPINE WITHOUT AND WITH CONTRAST TECHNIQUE: Multiplanar and multiecho pulse sequences of the cervical spine, to include the craniocervical junction and cervicothoracic junction, and thoracic and lumbar spine, were obtained without and with intravenous contrast. CONTRAST:  6 mL Gadavist IV contrast COMPARISON:  No comparison studies are available at the time of dictation FINDINGS: MRI CERVICAL SPINE FINDINGS Alignment: No static listhesis. Straightening of the cervical lordosis. Vertebrae: Rounded enhancing 9 mm lesion within the mid C2 vertebral body. Additional enhancing lesion centered within the left posterior aspect of the C5 vertebral body measuring up to 10 mm. No enhancing soft tissue mass. No evidence of epidural involvement or extension. Cord: Normal signal and morphology. No cord expansion or focal cord lesion. Posterior Fossa, vertebral arteries, paraspinal tissues: Enhancing marrow replacing lesion involving the left occipital condyle measuring 1.8 cm (series 6, image 14). No abnormality within the visualized posterior fossa. Paraspinal soft tissues are within normal limits. Disc levels: No significant disc protrusion, foraminal stenosis, or canal stenosis  at any level. Cervical intervertebral discs remain well hydrated with preservation of disc height. Normal cervical facet joints. MRI THORACIC SPINE FINDINGS Alignment: No static listhesis. 4 mm of bony retropulsion related to pathologic fracture of the T6 vertebrae. Vertebrae: Rounded 7 mm enhancing lesion within the anterior aspect of the T4 vertebral body. Small 2-3 mm lesion within the T5 vertebral body. Diffuse metastatic involvement of the T6 level with acute pathologic fracture where there is approximately 50% vertebral body height loss and 4 mm of bony retropulsion. Small 2-3 mm enhancing lesion near the right T8 pedicle. 9 mm enhancing lesion near the left T9 pedicle. 7 mm enhancing  metastatic lesion in the anterior aspect of the T10 vertebral body. Multiple enhancing foci within the T12 vertebral body where there is a chronic appearing mild superior endplate compression fracture without residual marrow edema. No enhancing soft tissue mass. No evidence of epidural involvement or extension. There is also a enhancing expansile rib lesion involving the posterior right third rib (series 25, image 7. Rib lesion measures approximately 2.9 x 1.3 cm. Suspect metastatic lesion of the manubrium (series 26, image 12). Cord: Normal signal and morphology. No cord expansion or focal cord lesion. Paraspinal and other soft tissues: No acute findings. Disc levels: Thoracic vertebral body heights are maintained with well-hydrated discs. There is no degenerative disc or facet arthropathy of the lumbar spine. No disc protrusion. Bony retropulsion at T6 contacts the ventral cord without canal stenosis. MRI LUMBAR SPINE FINDINGS Segmentation:  Standard. Alignment: No static listhesis. 4 mm of bony retropulsion related to pathologic fracture of the L1 vertebral body. Vertebrae: Diffuse metastatic lesion throughout the L1 vertebral body with acute pathologic compression fracture with approximately 75% vertebral body height loss  centrally and 4 mm of bony retropulsion. Enhancing 9 mm lesion at the superior endplate and right pedicle of L2. A few tiny enhancing foci are evident within the L3 and L5 vertebral bodies. Large enhancing lesion throughout the L4 vertebral body with acute pathologic fracture and approximately 60% vertebral body height loss centrally. Additional areas of enhancing metastatic disease are seen throughout the sacrum, most predominantly involving the lateral right sacral ala (series 6, image 3) rounded enhancing metastatic lesion within the right ilium adjacent to the right SI joint (series 7, image 35). Conus medullaris and cauda equina: Conus extends to the L1 level. Conus and cauda equina appear unremarkable without abnormal enhancement. Paraspinal and other soft tissues: Negative. Disc levels: T12-L1: No significant disc protrusion, foraminal stenosis, or canal stenosis. Bony retropulsion of the L1 vertebral body results in mild canal stenosis. L1-L2: No significant disc protrusion, foraminal stenosis, or canal stenosis. L2-L3: No significant disc protrusion, foraminal stenosis, or canal stenosis. L3-L4: Minimal disc bulge without foraminal or canal stenosis. L4-L5: Mild disc bulge, eccentric to the left without evidence of foraminal or canal stenosis. L5-S1: Diffuse disc bulge with a superimposed small central disc protrusion contacting the ventral thecal sac without canal stenosis. Bilateral foramina remain patent. IMPRESSION: 1. 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 There is acute pathologic compression fractures of T6, L1, and L4, as described above. Mild canal stenosis related to retropulsion of the L1 vertebral body. There is no high-grade foraminal or canal stenosis at any spinal level. 2. No evidence of extraosseous spinal soft tissue mass or epidural involvement. 3. Expansile posterior right third rib metastatic lesion. 4. Additional metastatic lesions  are seen within the left occipital condyle, manubrium, sacrum, and posterior right ilium. These results will be called to the ordering clinician or representative by the Radiologist Assistant, and communication documented in the PACS or [employer]. Electronically Signed   By: Davina Poke D.O.   On: 08/25/2019 15:25   PERIPHERAL VASCULAR CATHETERIZATION  Result Date: 08/28/2019 See op note  NM PET Image Initial (PI) Skull Base To Thigh  Addendum Date: 08/27/2019   ADDENDUM REPORT: 08/27/2019 17:27 ADDENDUM: These results will be called to the ordering clinician or representative by the Radiologist Assistant, and communication documented in the PACS or [employer]. Electronically Signed   By: Zetta Bills M.D.   On: 08/27/2019 17:27   Result Date: 08/27/2019  CLINICAL DATA:  Initial treatment strategy for breast cancer. EXAM: NUCLEAR MEDICINE PET SKULL BASE TO THIGH TECHNIQUE: 7.05 mCi F-18 FDG was injected intravenously. Full-ring PET imaging was performed from the skull base to thigh after the radiotracer. CT data was obtained and used for attenuation correction and anatomic localization. Fasting blood glucose: 78 mg/dl COMPARISON:  Spinal evaluations of 08/25/2019 FINDINGS: Mediastinal blood pool activity: SUV max 2.72 Liver activity: SUV max not applicable NECK: Asymmetric tonsillar activity RIGHT palatine tonsils approximately select approximately SUV 10.4, on the LEFT 4.5. No hypermetabolic lymph nodes in the neck, see below for thoracic inlet lymph nodes. Incidental CT findings: none CHEST: LEFT supraclavicular lymph node (image 55 of series 4) (SUVmax = 3.7 LEFT axillary lymph node (image 60, series 4) 7 mm (SUVmax = 5.1 with similar size and metabolically active lymph nodes in the area at least 7 additional lymph nodes. Smaller mildly hypermetabolic lymph nodes tracking towards the LEFT supraclavicular region under LEFT subpectoral musculature with a tiny lymph node with the maximum  SUV of 2.5 seen on image 57 of series 4 measuring 4 mm. See below for rib lesions.) Asymmetric soft tissue in the LEFT breast and marker for biopsy in an area with a maximum SUV of 7.9 that measures approximately 2.7 cm maximal dimension (image 80, series 4) Contralateral axillary lymph node (SUVmax = 2.6 (image 65, series 4) 6 mm.) Incidental CT findings: Mild calcified atheromatous plaque in the thoracic aorta. Heart size normal without pericardial effusion. Central pulmonary vasculature is normal caliber. Signs of basilar atelectasis question of background ground-glass but no consolidation, no pleural effusion or suspicious pulmonary nodule. ABDOMEN/PELVIS: No abnormal hypermetabolic activity within the liver, pancreas, adrenal glands, or spleen. No hypermetabolic lymph nodes in the abdomen or pelvis. Bilateral uptake in the adnexa along the expected course of the LEFT and RIGHT ureter is slightly more diffuse than expected for ureteral related activity but given bilateral appearance this is suspected Incidental CT findings: Cystic structures in the RIGHT adnexa without FDG uptake likely ovarian cysts largest measuring approximately 2.7 x 2.4 cm. RIGHT intrarenal calculus. Interpolar RIGHT kidney 4 mm. No acute gastrointestinal process. The appendix is normal. SKELETON: Diffuse skeletal metastatic disease. LEFT C1/skull base lesion with soft tissue at the foramen magnum (image 17, series 4) (SUVmax = 9.2 soft tissue in this area measuring approximately 12 x 12 mm and extending into the occipital condyle on the LEFT likely and also involving C1. C2 metastatic focus markedly hypermetabolic (SUVmax = 9.1 associated with vertebral body destruction measuring approximately 9 x 6 mm. Destructive changes at the C5 level also with marked hypermetabolic activity. Lesion associated with vertebral body destruction at T5-T6 showing marked hypermetabolic activity better demonstrated on the thoracic spine MRI. Pathologic  fracture also at the L1 level and with adjacent destruction of L2 is associated with hypermetabolic activity as are multiple other foci in the lumbar spine. Lesion without CT correlate in the RIGHT proximal humerus. Destructive lesion involving the RIGHT second rib (SUVmax = 13.6 signs of metastasis to the sternal body, LEFT anterior third rib, RIGHT hemi sacrum, bilateral iliac bones, LEFT ischium and RIGHT femoral neck with smaller focus in the LEFT femoral shaft. Of particular concern are lesions with bone destruction at the first sacral level within the sacral ala measuring 2.8 x 1.9 cm (SUVmax = 11.5 similar activity with some bone destruction along the sacroiliac joint measuring 1.4 by 0.8 cm adjacent to the sacral lesion. Another area of considerable concern is the cortical  destruction and soft tissue associated with RIGHT femoral neck (image 216, series 4) (SUVmax = 12.9) This is followed by the diffuse activity and cortical destruction noted in the LEFT iliac bone which is associated with extensive periosteal reaction extending from the ischium through the iliac and associated with a small fracture in the nonweightbearing portion of the iliac bone but just above the LEFT acetabulum. (SUVmax = 13.7.) ) Incidental CT findings: none IMPRESSION: 1. 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 Redemonstration of metastatic, pathologic fractures associated with multiple sites of metastatic disease throughout the spine. 2. Sites of concern for future pathologic fracture of the RIGHT sacral ala and RIGHT proximal femur. 3. Pathologic fracture along the anterior aspect of the LEFT iliac bone just above the acetabulum with diffuse iliac and ischial 4. Asymmetric tonsillar/base of tongue activity in the neck is of uncertain significance. Direct clinical inspection or close attention on follow-up is suggested. 5. LEFT breast activity and adenopathy as discussed. 6. Area of activity  in the bilateral adnexa favored to represent distal ureters. Attention on follow-up. Correlation could be made with contrasted imaging of the chest, abdomen and pelvis. Electronically Signed: By: Donzetta Kohut M.D. On: 08/27/2019 17:23   DG C-Arm 1-60 Min  Result Date: 09/01/2019 CLINICAL DATA:  Intramedullary nail insertion right femur secondary to metastatic disease and pinning fracture. EXAM: DG C-ARM 1-60 MIN; RIGHT FEMUR 2 VIEWS FLUOROSCOPY TIME:  Fluoroscopy Time:  3 minutes 45 seconds COMPARISON:  CT dated 08/27/2019 FINDINGS: C-arm images demonstrate insertion of intramedullary nail and proximal and distal screws in the right femur. The hardware appears in good position. Fracture. IMPRESSION: Satisfactory appearance of the right femur after intramedullary nail insertion. C-arm fluoroscopic images were obtained intraoperatively and submitted for post operative interpretation. Electronically Signed   By: Francene Boyers M.D.   On: 09/01/2019 16:05   DG C-Arm 1-60 Min  Result Date: 09/01/2019 CLINICAL DATA:  Metastatic disease to the sacrum and pelvic bones. EXAM: DG C-ARM 1-60 MIN; SACRUM AND COCCYX - 2+ VIEW FLUOROSCOPY TIME:  Fluoroscopy Time:  3 minutes 45 seconds COMPARISON:  PET-CT dated 08/27/2019 FINDINGS: AP and lateral C-arm images demonstrate contrast in the right sacral ala and adjacent right iliac bone which correlates with metastatic lesions visible on PET-CT. IMPRESSION: Cement augmentation of the right ilium and right sacral ala after radiofrequency ablation of metastatic disease. C-arm fluoroscopic images were obtained intraoperatively and submitted for post operative interpretation. Electronically Signed   By: Francene Boyers M.D.   On: 09/01/2019 16:02   DG C-Arm 1-60 Min  Result Date: 08/29/2019 CLINICAL DATA:  Vertebral augmentation. EXAM: THORACIC SPINE 2 VIEWS; DG C-ARM 1-60 MIN COMPARISON:  MRI 08/25/2019 FINDINGS: Multiple C-arm images are provided. In the lumbar region,  vertebral augmentation at L1 has a good appearance. Vertebral augmentation at the L4 level shows methylmethacrylate throughout the vertebral body, and also apparently based on this 1 projection a large amount within the spinal canal, measuring up to 9 mm in thickness and extending from the superior aspect of L4 down to the superior aspect of L5. This could result in compressive stenosis. Thoracic region imaging shows methylmethacrylate well distributed within the T6 vertebral body. There is probably some extension into the disc space. IMPRESSION: Vertebral augmentation at T6, L1 and L4. At L4, there appears to be ventral epidural cement measuring up to 9 mm in thickness. See above. Electronically Signed   By: Paulina Fusi M.D.   On: 08/29/2019 15:08  DG C-Arm 1-60 Min  Result Date: 08/29/2019 CLINICAL DATA:  Vertebral augmentation. EXAM: THORACIC SPINE 2 VIEWS; DG C-ARM 1-60 MIN COMPARISON:  MRI 08/25/2019 FINDINGS: Multiple C-arm images are provided. In the lumbar region, vertebral augmentation at L1 has a good appearance. Vertebral augmentation at the L4 level shows methylmethacrylate throughout the vertebral body, and also apparently based on this 1 projection a large amount within the spinal canal, measuring up to 9 mm in thickness and extending from the superior aspect of L4 down to the superior aspect of L5. This could result in compressive stenosis. Thoracic region imaging shows methylmethacrylate well distributed within the T6 vertebral body. There is probably some extension into the disc space. IMPRESSION: Vertebral augmentation at T6, L1 and L4. At L4, there appears to be ventral epidural cement measuring up to 9 mm in thickness. See above. Electronically Signed   By: Nelson Chimes M.D.   On: 08/29/2019 15:08   ECHOCARDIOGRAM COMPLETE  Result Date: 08/26/2019    ECHOCARDIOGRAM REPORT   Patient Name:   AYRA HODGDON [facility] Date of Exam: 08/26/2019 Medical Rec #:  [id number]             Height:        61.0 in Accession #:    [id number]            Weight:       139.2 lb Date of Birth:  17-Nov-1980             BSA:          1.619 m Patient Age:    8 years              BP:           112/54 mmHg Patient Gender: F                     HR:           94 bpm. Exam Location:  [facility] Procedure: 2D Echo, Color Doppler and Cardiac Doppler Indications:     v58.11 Chemotherapy evaluation  History:         Patient has no prior history of Echocardiogram examinations.                  Vertigo.  Sonographer:     Charmayne Sheer RDCS (AE) Referring Phys:  [id number] Weston Anna Angele Wiemann Diagnosing Phys: Harrell Gave End MD  Sonographer Comments: Suboptimal subcostal window. IMPRESSIONS  1. Left ventricular ejection fraction, by estimation, is 65 to 70%. The left ventricle has normal function. The left ventricle has no regional wall motion abnormalities. Left ventricular diastolic parameters are indeterminate.  2. Right ventricular systolic function is normal. The right ventricular size is normal.  3. The mitral valve is normal in structure. No evidence of mitral valve regurgitation. No evidence of mitral stenosis.  4. The aortic valve is grossly normal. Aortic valve regurgitation is not visualized. No aortic stenosis is present.  5. The inferior vena cava is normal in size with greater than 50% respiratory variability, suggesting right atrial pressure of 3 mmHg. FINDINGS  Left Ventricle: Left ventricular ejection fraction, by estimation, is 65 to 70%. The left ventricle has normal function. The left ventricle has no regional wall motion abnormalities. The left ventricular internal cavity size was normal in size. There is  no left ventricular hypertrophy. Left ventricular diastolic parameters are indeterminate. Right Ventricle: The right ventricular size is normal. No increase in right ventricular wall thickness. Right ventricular  systolic function is normal. Left Atrium: Left atrial size was normal in size. Right Atrium: Right atrial size was not  well visualized. Pericardium: The pericardium was not well visualized. Mitral Valve: The mitral valve is normal in structure. No evidence of mitral valve regurgitation. No evidence of mitral valve stenosis. MV peak gradient, 3.2 mmHg. The mean mitral valve gradient is 1.0 mmHg. Tricuspid Valve: The tricuspid valve is not well visualized. Tricuspid valve regurgitation is not demonstrated. Aortic Valve: The aortic valve is grossly normal. Aortic valve regurgitation is not visualized. No aortic stenosis is present. Aortic valve mean gradient measures 3.0 mmHg. Aortic valve peak gradient measures 5.3 mmHg. Aortic valve area, by VTI measures 1.60 cm. Pulmonic Valve: The pulmonic valve was not well visualized. Pulmonic valve regurgitation is not visualized. No evidence of pulmonic stenosis. Aorta: The aortic root is normal in size and structure. Pulmonary Artery: The pulmonary artery is of normal size. Venous: The inferior vena cava is normal in size with greater than 50% respiratory variability, suggesting right atrial pressure of 3 mmHg. IAS/Shunts: The interatrial septum was not well visualized.  LEFT VENTRICLE PLAX 2D LVIDd:         4.21 cm  Diastology LVIDs:         2.58 cm  LV e' lateral:   9.25 cm/s LV PW:         0.82 cm  LV E/e' lateral: 6.7 LV IVS:        0.69 cm  LV e' medial:    7.51 cm/s LVOT diam:     1.60 cm  LV E/e' medial:  8.2 LV SV:         29 LV SV Index:   18 LVOT Area:     2.01 cm  LEFT ATRIUM             Index LA diam:        2.60 cm 1.61 cm/m LA Vol (A2C):   20.5 ml 12.66 ml/m LA Vol (A4C):   29.3 ml 18.10 ml/m LA Biplane Vol: 26.1 ml 16.12 ml/m  AORTIC VALVE                   PULMONIC VALVE AV Area (Vmax):    1.74 cm    PV Vmax:       1.08 m/s AV Area (Vmean):   1.69 cm    PV Vmean:      76.800 cm/s AV Area (VTI):     1.60 cm    PV VTI:        0.202 m AV Vmax:           115.00 cm/s PV Peak grad:  4.7 mmHg AV Vmean:          75.100 cm/s PV Mean grad:  3.0 mmHg AV VTI:            0.179 m AV  Peak Grad:      5.3 mmHg AV Mean Grad:      3.0 mmHg LVOT Vmax:         99.80 cm/s LVOT Vmean:        63.000 cm/s LVOT VTI:          0.142 m LVOT/AV VTI ratio: 0.79  AORTA Ao Root diam: 2.60 cm MITRAL VALVE MV Area (PHT): 6.17 cm    SHUNTS MV Peak grad:  3.2 mmHg    Systemic VTI:  0.14 m MV Mean grad:  1.0 mmHg    Systemic Diam:  1.60 cm MV Vmax:       0.89 m/s MV Vmean:      58.1 cm/s MV Decel Time: 123 msec MV E velocity: 61.60 cm/s MV A velocity: 77.60 cm/s MV E/A ratio:  0.79 Nelva Bush MD Electronically signed by Nelva Bush MD Signature Date/Time: 08/26/2019/1:28:27 PM    Final    DG Hip Unilat W or Wo Pelvis 2-3 Views Right  Result Date: 08/28/2019 CLINICAL DATA:  Right hip pain. History of metastatic breast cancer. EXAM: DG HIP (WITH OR WITHOUT PELVIS) 2-3V RIGHT COMPARISON:  None. FINDINGS: There is no evidence of hip fracture or dislocation. There is no evidence of arthropathy. A 4 mm round sclerotic focus is seen within the inter trochanteric region of the proximal right femur. IMPRESSION: Small sclerotic focus within the proximal right femur which may represent a small bone island. Given the patient's history of metastatic breast cancer, a small metastatic focus cannot be excluded. Electronically Signed   By: Virgina Norfolk M.D.   On: 08/28/2019 21:01   DG FEMUR, MIN 2 VIEWS RIGHT  Result Date: 09/01/2019 CLINICAL DATA:  Intramedullary nail insertion right femur secondary to metastatic disease and pinning fracture. EXAM: DG C-ARM 1-60 MIN; RIGHT FEMUR 2 VIEWS FLUOROSCOPY TIME:  Fluoroscopy Time:  3 minutes 45 seconds COMPARISON:  CT dated 08/27/2019 FINDINGS: C-arm images demonstrate insertion of intramedullary nail and proximal and distal screws in the right femur. The hardware appears in good position. Fracture. IMPRESSION: Satisfactory appearance of the right femur after intramedullary nail insertion. C-arm fluoroscopic images were obtained intraoperatively and submitted for post  operative interpretation. Electronically Signed   By: Lorriane Shire M.D.   On: 09/01/2019 16:05     Assessment and plan- Patient is a 39 y.o. 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    Patient is here for dose 2 of Taxol  Counts okay to proceed with dose 2 of Taxol today.  I will see her back in 1 week with CBC with differential, CMP and tumor marker CA 27-29 and 15 3 for cycle 2-day 1 of Herceptin Perjeta and Taxol  Neoplasm related pain: Continue OxyContin and as needed oxycodone.  She is also undergoing palliative radiation treatment.  Pain is currently stable  Bone metastases: Upon completion of palliative radiation treatment I will discuss starting bisphosphonates with her  Abnormal LFTs: They are still abnormal but overall stable.  Continue to monitor  Anemia: Possibly secondary to recent surgeries.  Continue to monitor.  We will add iron studies B12 and folate with next set of labs   Visit Diagnosis 1. Encounter for antineoplastic chemotherapy   2. Neoplasm related pain   3. Abnormal LFTs   4. Bone metastases ([place])   5. Primary cancer of left breast with metastasis to other site [facility])      Dr. Randa Evens, MD, MPH [facility] at [facility] [phone] 09/19/2019 9:57 AM

## 2019-09-23 ENCOUNTER — Ambulatory Visit
Admission: RE | Admit: 2019-09-23 | Discharge: 2019-09-23 | Disposition: A | Discharge status: Home / Self Care | Payer: Self-pay | Source: Ambulatory Visit | Attending: Radiation Oncology | Admitting: Radiation Oncology

## 2019-09-23 ENCOUNTER — Inpatient Hospital Stay: Payer: Self-pay

## 2019-09-24 ENCOUNTER — Inpatient Hospital Stay: Payer: Self-pay

## 2019-09-24 ENCOUNTER — Ambulatory Visit
Admission: RE | Admit: 2019-09-24 | Discharge: 2019-09-24 | Disposition: A | Discharge status: Home / Self Care | Payer: Self-pay | Source: Ambulatory Visit | Attending: Radiation Oncology | Admitting: Radiation Oncology

## 2019-09-25 ENCOUNTER — Inpatient Hospital Stay: Payer: Self-pay

## 2019-09-25 ENCOUNTER — Ambulatory Visit
Admission: RE | Admit: 2019-09-25 | Discharge: 2019-09-25 | Disposition: A | Discharge status: Home / Self Care | Payer: Self-pay | Source: Ambulatory Visit | Attending: Radiation Oncology | Admitting: Radiation Oncology

## 2019-09-26 ENCOUNTER — Inpatient Hospital Stay: Payer: Self-pay

## 2019-09-26 ENCOUNTER — Inpatient Hospital Stay (HOSPITAL_BASED_OUTPATIENT_CLINIC_OR_DEPARTMENT_OTHER): Payer: Self-pay | Admitting: Oncology

## 2019-09-26 ENCOUNTER — Ambulatory Visit
Admission: RE | Admit: 2019-09-26 | Discharge: 2019-09-26 | Disposition: A | Discharge status: Home / Self Care | Payer: Self-pay | Source: Ambulatory Visit | Attending: Radiation Oncology | Admitting: Radiation Oncology

## 2019-09-26 ENCOUNTER — Other Ambulatory Visit: Payer: Self-pay

## 2019-09-26 ENCOUNTER — Encounter: Payer: Self-pay | Admitting: Oncology

## 2019-09-26 VITALS — BP 102/67 | HR 91 | Resp 16

## 2019-09-26 VITALS — BP 114/73 | HR 97 | Temp 96.8°F | Resp 16 | Wt 134.6 lb

## 2019-09-26 DIAGNOSIS — R945 Abnormal results of liver function studies: Secondary | ICD-10-CM

## 2019-09-26 DIAGNOSIS — C50912 Malignant neoplasm of unspecified site of left female breast: Secondary | ICD-10-CM

## 2019-09-26 DIAGNOSIS — Z5111 Encounter for antineoplastic chemotherapy: Secondary | ICD-10-CM

## 2019-09-26 DIAGNOSIS — G893 Neoplasm related pain (acute) (chronic): Secondary | ICD-10-CM

## 2019-09-26 DIAGNOSIS — C7951 Secondary malignant neoplasm of bone: Secondary | ICD-10-CM

## 2019-09-26 DIAGNOSIS — C50919 Malignant neoplasm of unspecified site of unspecified female breast: Secondary | ICD-10-CM

## 2019-09-26 DIAGNOSIS — R7989 Other specified abnormal findings of blood chemistry: Secondary | ICD-10-CM

## 2019-09-26 DIAGNOSIS — M8440XA Pathological fracture, unspecified site, initial encounter for fracture: Secondary | ICD-10-CM

## 2019-09-26 LAB — COMPREHENSIVE METABOLIC PANEL
ALT: 45 U/L — ABNORMAL HIGH (ref 0–44)
AST: 31 U/L (ref 15–41)
Albumin: 3.2 g/dL — ABNORMAL LOW (ref 3.5–5.0)
Alkaline Phosphatase: 216 U/L — ABNORMAL HIGH (ref 38–126)
Anion gap: 9 (ref 5–15)
BUN: 7 mg/dL (ref 6–20)
CO2: 25 mmol/L (ref 22–32)
Calcium: 8.8 mg/dL — ABNORMAL LOW (ref 8.9–10.3)
Chloride: 104 mmol/L (ref 98–111)
Creatinine, Ser: 0.46 mg/dL (ref 0.44–1.00)
GFR calc Af Amer: >60 mL/min (ref >60)
GFR calc non Af Amer: >60 mL/min (ref >60)
Glucose, Bld: 104 mg/dL — ABNORMAL HIGH (ref 70–99)
Potassium: 3.5 mmol/L (ref 3.5–5.1)
Sodium: 138 mmol/L (ref 135–145)
Total Bilirubin: 0.5 mg/dL (ref 0.3–1.2)
Total Protein: 6.9 g/dL (ref 6.5–8.1)

## 2019-09-26 LAB — CBC WITH DIFFERENTIAL/PLATELET
Abs Immature Granulocytes: 0.25 10*3/uL — ABNORMAL HIGH (ref 0.00–0.07)
Basophils Absolute: 0 10*3/uL (ref 0.0–0.1)
Basophils Relative: 1 %
Eosinophils Absolute: 0.1 10*3/uL (ref 0.0–0.5)
Eosinophils Relative: 2 %
HCT: 31.5 % — ABNORMAL LOW (ref 36.0–46.0)
Hemoglobin: 10.5 g/dL — ABNORMAL LOW (ref 12.0–15.0)
Immature Granulocytes: 6 %
Lymphocytes Relative: 30 %
Lymphs Abs: 1.2 10*3/uL (ref 0.7–4.0)
MCH: 29.6 pg (ref 26.0–34.0)
MCHC: 33.3 g/dL (ref 30.0–36.0)
MCV: 88.7 fL (ref 80.0–100.0)
Monocytes Absolute: 0.4 10*3/uL (ref 0.1–1.0)
Monocytes Relative: 10 %
Neutro Abs: 2.1 10*3/uL (ref 1.7–7.7)
Neutrophils Relative %: 51 %
Platelets: 527 10*3/uL — ABNORMAL HIGH (ref 150–400)
RBC: 3.55 MIL/uL — ABNORMAL LOW (ref 3.87–5.11)
RDW: 15.3 % (ref 11.5–15.5)
Smear Review: INCREASED
WBC: 4.2 10*3/uL (ref 4.0–10.5)
nRBC: 0 % (ref 0.0–0.2)

## 2019-09-26 LAB — IRON AND TIBC
Iron: 31 ug/dL (ref 28–170)
Saturation Ratios: 10 % — ABNORMAL LOW (ref 10.4–31.8)
TIBC: 328 ug/dL (ref 250–450)
UIBC: 297 ug/dL

## 2019-09-26 LAB — VITAMIN B12: Vitamin B-12: 427 pg/mL (ref 180–914)

## 2019-09-26 LAB — FERRITIN: Ferritin: 100 ng/mL (ref 11–307)

## 2019-09-26 LAB — FOLATE: Folate: 14.3 ng/mL (ref >5.9)

## 2019-09-26 MED ORDER — FAMOTIDINE IN NACL 20-0.9 MG/50ML-% IV SOLN
20.0000 mg | Freq: Once | INTRAVENOUS | Status: AC
  Administered 2019-09-26: 20 mg via INTRAVENOUS
  Filled 2019-09-26: qty 50

## 2019-09-26 MED ORDER — SODIUM CHLORIDE 0.9 % IV SOLN
80.0000 mg/m2 | Freq: Once | INTRAVENOUS | Status: AC
  Administered 2019-09-26: 132 mg via INTRAVENOUS
  Filled 2019-09-26: qty 22

## 2019-09-26 MED ORDER — SODIUM CHLORIDE 0.9 % IV SOLN
420.0000 mg | Freq: Once | INTRAVENOUS | Status: AC
  Administered 2019-09-26: 420 mg via INTRAVENOUS
  Filled 2019-09-26: qty 14

## 2019-09-26 MED ORDER — DIPHENHYDRAMINE HCL 50 MG/ML IJ SOLN
50.0000 mg | Freq: Once | INTRAMUSCULAR | Status: AC
  Administered 2019-09-26: 50 mg via INTRAVENOUS
  Filled 2019-09-26: qty 1

## 2019-09-26 MED ORDER — SODIUM CHLORIDE 0.9 % IV SOLN
20.0000 mg | Freq: Once | INTRAVENOUS | Status: AC
  Administered 2019-09-26: 20 mg via INTRAVENOUS
  Filled 2019-09-26: qty 20

## 2019-09-26 MED ORDER — GABAPENTIN 300 MG PO CAPS: 300.0000 mg | ORAL_CAPSULE | Freq: Two times a day (BID) | ORAL | 1 refills | Status: DC

## 2019-09-26 MED ORDER — ACETAMINOPHEN 325 MG PO TABS
650.0000 mg | ORAL_TABLET | Freq: Once | ORAL | Status: AC
  Administered 2019-09-26: 650 mg via ORAL
  Filled 2019-09-26: qty 2

## 2019-09-26 MED ORDER — SODIUM CHLORIDE 0.9 % IV SOLN
Freq: Once | INTRAVENOUS | Status: AC
  Filled 2019-09-26: qty 250

## 2019-09-26 MED ORDER — LORAZEPAM 0.5 MG PO TABS: 0.5000 mg | ORAL_TABLET | Freq: Four times a day (QID) | ORAL | 0 refills | Status: DC | PRN

## 2019-09-26 MED ORDER — SODIUM CHLORIDE 0.9% FLUSH
10.0000 mL | INTRAVENOUS | Status: DC | PRN
  Administered 2019-09-26: 10 mL via INTRAVENOUS
  Filled 2019-09-26: qty 10

## 2019-09-26 MED ORDER — TRASTUZUMAB CHEMO 150 MG IV SOLR
6.0000 mg/kg | Freq: Once | INTRAVENOUS | Status: AC
  Administered 2019-09-26: 357 mg via INTRAVENOUS
  Filled 2019-09-26: qty 17

## 2019-09-26 MED ORDER — HEPARIN SOD (PORK) LOCK FLUSH 100 UNIT/ML IV SOLN
INTRAVENOUS | Status: AC
  Filled 2019-09-26: qty 5

## 2019-09-26 MED ORDER — HEPARIN SOD (PORK) LOCK FLUSH 100 UNIT/ML IV SOLN
500.0000 [IU] | Freq: Once | INTRAVENOUS | Status: AC
  Administered 2019-09-26: 500 [IU] via INTRAVENOUS
  Filled 2019-09-26: qty 5

## 2019-09-26 NOTE — Progress Notes (Signed)
Patient here for oncology follow-up appointment, expresses no complaints or concerns of small blood when blowing nose and weird smell from fingers.

## 2019-09-26 NOTE — Progress Notes (Signed)
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 History obtained with the help of an interpreter.Patient self palpated left breast mass which was followed by a diagnostic bilateral mammogram. Mammogram showed 3.1 x 2.9 x 1.9 cm hypoechoic mass at the 1 o'clock position of the left breast. For abnormal cortically thickened left axillary lymph nodes measuring up to 5 mm. 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 Patient was also having ongoing back pain and was seen by [facility] orthopedics Dr. Doyle Askew who ordered MRI lumbar spine without contrast which showed possible pathologic fractures of L1 and L4 vertebral bodies with greater than 50% height loss at L1 and abnormal signal involving L2-L3 S1 as well as right iliac bone concerning for metastatic disease.Patient is a single mother of 3 adult children and is very anxious today.She reports significant back pain which radiates to her bilateral thighs. Denies any focal tingling numbness or weakness. Denies any bowel bladder incontinence. Pain has been uncontrolled despite taking Tylenol. No prior history of abnormal breast biopsies. No family history of breast cancer  PET and MRIshowed 3 areas of pathologic  fracture of her spine as well as widespread bony metastatic disease and concern for impending fracture of the right hip. Given her worsening pain she was asked to come to the ER. She has been evaluated by Dr. Rudene Christians from orthopedic surgery and underwent kyphoplasty at 3 different levels.T6 L1 and L4 along with radiofrequency ablation.She also underwent prophylactic fixation of the right hip and not affected the sacral region.  Patient received first dose of Herceptin and Perjeta on 09/04/2019. Baseline echocardiogram normal.  Interval history-history obtained with the help of Spanish interpreter.  No significant changes since last visit.  Back pain is slowly getting better.  She is taking OxyContin every 12 hours and uses about 3 oxycodones during the day.  Bowel movements are regular.  Reports occasional nosebleeds.  Denies any tingling numbness in her hands or feet.  ECOG PS- 1 Pain scale- 4 Opioid associated constipation- no  Review of systems- Review of Systems  Constitutional: Positive for malaise/fatigue. Negative for chills, fever and weight loss.  HENT: Negative for congestion, ear discharge and nosebleeds.   Eyes: Negative for blurred vision.  Respiratory: Negative for cough, hemoptysis, sputum production, shortness of breath and wheezing.   Cardiovascular: Negative for chest pain, palpitations, orthopnea and claudication.  Gastrointestinal: Negative for abdominal pain, blood in stool, constipation, diarrhea, heartburn, melena, nausea and vomiting.  Genitourinary: Negative for dysuria, flank pain, frequency, hematuria and urgency.  Musculoskeletal: Positive for back pain. Negative for joint pain and myalgias.  Skin: Negative for rash.  Neurological: Negative for dizziness, tingling, focal weakness, seizures, weakness and headaches.  Endo/Heme/Allergies: Does not bruise/bleed easily.  Psychiatric/Behavioral: Negative for depression and suicidal ideas. The patient is  nervous/anxious.  The patient does not have insomnia.       No Known Allergies   Past Medical History:  Diagnosis Date  . Anxiety   . Cancer ([place])   . Vertigo      Past Surgical History:  Procedure Laterality Date  . BREAST BIOPSY Left 08/14/2019   [place] bx of mass, coil marker, path pending  . BREAST BIOPSY Left 08/14/2019   [place] bx of LN, hydromarker, path pending  . BREAST BIOPSY Left 08/14/2019   affirm bx of calcs, x marker, path pending  . INTRAMEDULLARY (IM) NAIL INTERTROCHANTERIC Right 09/01/2019   Procedure: INTRAMEDULLARY (IM) NAIL INTERTROCHANTRIC AND RADIOFREQUENCY ABLATION;  Surgeon: Hessie Knows, MD;  Location: [facility] ORS;  Service: Orthopedics;  Laterality: Right;  . KYPHOPLASTY N/A 08/29/2019   Procedure: KYPHOPLASTY T6, L1,L4 ,  RADIOFREQUENCY ABLATION;  Surgeon: Hessie Knows, MD;  Location: [facility] ORS;  Service: Orthopedics;  Laterality: N/A;  . KYPHOPLASTY Right 09/01/2019   Procedure: Right Sacral Radiofrequency Ablation and Cement Augmentation, Right sacrum and iliac crest;  Surgeon: Hessie Knows, MD;  Location: [facility] ORS;  Service: Orthopedics;  Laterality: Right;  . PORTA CATH INSERTION N/A 08/28/2019   Procedure: PORTA CATH INSERTION;  Surgeon: Algernon Huxley, MD;  Location: [place] [facility];  Service: Cardiovascular;  Laterality: N/A;    Social History   Socioeconomic History  . Marital status: Single    Spouse name: Not on file  . Number of children: Not on file  . Years of education: Not on file  . Highest education level: Not on file  Occupational History  . Not on file  Tobacco Use  . Smoking status: Never Smoker  . Smokeless tobacco: Never Used  Vaping Use  . Vaping Use: Never used  Substance and Sexual Activity  . Alcohol use: Yes    Comment: occassionaly  . Drug use: Not Currently  . Sexual activity: Not Currently    Birth control/protection: None  Other Topics Concern  . Not on file  Social History Narrative   Lives at home with children    Social Determinants of Health   Financial Resource Strain:   . Difficulty of Paying Living Expenses:   Food Insecurity:   . Worried About Charity fundraiser in the Last Year:   . Arboriculturist in the Last Year:   Transportation Needs:   . Film/video editor (Medical):   Marland Kitchen Lack of Transportation (Non-Medical):   Physical Activity:   . Days of Exercise per Week:   . Minutes of Exercise per Session:   Stress:   . Feeling of Stress :   Social Connections:   . Frequency of Communication with Friends and Family:   . Frequency of Social Gatherings with Friends and Family:   . Attends Religious Services:   . Active Member of Clubs or Organizations:   . Attends Archivist Meetings:   Marland Kitchen Marital Status:   Intimate Partner Violence:   . Fear of Current or Ex-Partner:   . Emotionally Abused:   Marland Kitchen Physically Abused:   . Sexually Abused:     Family History  Problem Relation Age of Onset  . Colon cancer Maternal Uncle      Current Outpatient Medications:  .  enoxaparin (LOVENOX) 40 MG/0.4ML injection, Inject 0.4 mLs (40 mg total) into the skin daily for 14 days., Disp: 5.6 mL, Rfl: 0 .  gabapentin (NEURONTIN) 300 MG capsule, Take 1 capsule (300 mg total) by mouth 2 (two)  times daily., Disp: 60 capsule, Rfl: 1 .  hydrOXYzine (ATARAX/VISTARIL) 10 MG tablet, Take 1 tablet (10 mg total) by mouth 3 (three) times daily as needed for anxiety., Disp: 30 tablet, Rfl: 0 .  lidocaine-prilocaine (EMLA) cream, Apply 1 application topically as needed. Apply small amount to port site at least 1 hour prior to it being accessed, cover with plastic wrap, Disp: 30 g, Rfl: 1 .  LORazepam (ATIVAN) 0.5 MG tablet, Take 1 tablet (0.5 mg total) by mouth every 6 (six) hours as needed for anxiety., Disp: 30 tablet, Rfl: 0 .  ondansetron (ZOFRAN) 4 MG tablet, Take 1 tablet (4 mg total) by mouth every 6 (six) hours as needed for nausea or vomiting. (Patient not taking: Reported on 09/10/2019), Disp:  60 tablet, Rfl: 0 .  oxyCODONE (OXY IR/ROXICODONE) 5 MG immediate release tablet, Take 1-2 tablets (5-10 mg total) by mouth every 4 (four) hours as needed for moderate pain., Disp: 120 tablet, Rfl: 0 .  oxyCODONE (OXYCONTIN) 10 mg 12 hr tablet, Take 1 tablet (10 mg total) by mouth every 12 (twelve) hours., Disp: 60 tablet, Rfl: 0 .  senna-docusate (SENOKOT-S) 8.6-50 MG tablet, Take 1 tablet by mouth 2 (two) times daily. (Patient not taking: Reported on 09/19/2019), Disp: 30 tablet, Rfl: 1 No current facility-administered medications for this visit.  Facility-Administered Medications Ordered in Other Visits:  .  0.9 %  sodium chloride infusion, , Intravenous, Once PRN, Sindy Guadeloupe, MD, Stopped at 09/04/19 1425  Physical exam:  Vitals:   09/26/19 0912  BP: 114/73  Pulse: 97  Resp: 16  Temp: (!) 96.8 F (36 C)  TempSrc: Tympanic  SpO2: 100%  Weight: 134 lb 9.6 oz (61.1 kg)   Physical Exam Constitutional:      Comments: Sitting in a wheelchair.  Appears in no acute distress  Cardiovascular:     Rate and Rhythm: Normal rate and regular rhythm.     Heart sounds: Normal heart sounds.  Pulmonary:     Effort: Pulmonary effort is normal.     Breath sounds: Normal breath sounds.  Abdominal:     General: Bowel sounds are normal.     Palpations: Abdomen is soft.  Skin:    General: Skin is warm and dry.  Neurological:     Mental Status: She is alert and oriented to person, place, and time.      CMP Latest Ref Rng & Units 09/19/2019  Glucose 70 - 99 mg/dL 106(H)  BUN 6 - 20 mg/dL 6  Creatinine 0.44 - 1.00 mg/dL 0.46  Sodium 135 - 145 mmol/L 136  Potassium 3.5 - 5.1 mmol/L 3.5  Chloride 98 - 111 mmol/L 102  CO2 22 - 32 mmol/L 26  Calcium 8.9 - 10.3 mg/dL 8.5(L)  Total Protein 6.5 - 8.1 g/dL 7.1  Total Bilirubin 0.3 - 1.2 mg/dL 0.7  Alkaline Phos 38 - 126 U/L 291(H)  AST 15 - 41 U/L 46(H)  ALT 0 - 44 U/L 85(H)   CBC Latest Ref Rng & Units 09/19/2019  WBC 4.0 - 10.5 K/uL 4.1    Hemoglobin 12.0 - 15.0 g/dL 9.8(L)  Hematocrit 36 - 46 % 30.2(L)  Platelets 150 - 400 K/uL 469(H)    No images are attached to the encounter.  DG Thoracic Spine 2 View  Result Date: 08/29/2019 CLINICAL DATA:  Vertebral augmentation. EXAM: THORACIC SPINE 2 VIEWS; DG C-ARM 1-60 MIN COMPARISON:  MRI 08/25/2019 FINDINGS: Multiple C-arm images are provided. In the lumbar region, vertebral  augmentation at L1 has a good appearance. Vertebral augmentation at the L4 level shows methylmethacrylate throughout the vertebral body, and also apparently based on this 1 projection a large amount within the spinal canal, measuring up to 9 mm in thickness and extending from the superior aspect of L4 down to the superior aspect of L5. This could result in compressive stenosis. Thoracic region imaging shows methylmethacrylate well distributed within the T6 vertebral body. There is probably some extension into the disc space. IMPRESSION: Vertebral augmentation at T6, L1 and L4. At L4, there appears to be ventral epidural cement measuring up to 9 mm in thickness. See above. Electronically Signed   By: Paulina Fusi M.D.   On: 08/29/2019 15:08   DG Sacrum/Coccyx  Result Date: 09/01/2019 CLINICAL DATA:  Metastatic disease to the sacrum and pelvic bones. EXAM: DG C-ARM 1-60 MIN; SACRUM AND COCCYX - 2+ VIEW FLUOROSCOPY TIME:  Fluoroscopy Time:  3 minutes 45 seconds COMPARISON:  PET-CT dated 08/27/2019 FINDINGS: AP and lateral C-arm images demonstrate contrast in the right sacral ala and adjacent right iliac bone which correlates with metastatic lesions visible on PET-CT. IMPRESSION: Cement augmentation of the right ilium and right sacral ala after radiofrequency ablation of metastatic disease. C-arm fluoroscopic images were obtained intraoperatively and submitted for post operative interpretation. Electronically Signed   By: Francene Boyers M.D.   On: 09/01/2019 16:02   MR Brain W and Wo Contrast  Result Date:  08/29/2019 CLINICAL DATA:  Metastatic breast cancer. EXAM: MRI HEAD WITHOUT AND WITH CONTRAST TECHNIQUE: Multiplanar, multiecho pulse sequences of the brain and surrounding structures were obtained without and with intravenous contrast. CONTRAST:  6mL GADAVIST GADOBUTROL 1 MMOL/ML IV SOLN COMPARISON:  None. FINDINGS: Brain: There is no acute infarct, acute hemorrhage or extra-axial collection. Normal white matter signal. CSF spaces are normal. No contrast-enhancing lesions. No chronic microhemorrhage. Midline structures are normal. Vascular: Normal flow voids Skull and upper cervical spine: Normal bone marrow signal. Sinuses/Orbits: Paranasal sinuses and mastoids are clear. Normal orbits. Other: None IMPRESSION: Normal brain MRI. No evidence of intracranial metastatic disease. Electronically Signed   By: Deatra Robinson M.D.   On: 08/29/2019 02:07   PERIPHERAL VASCULAR CATHETERIZATION  Result Date: 08/28/2019 See op note  NM PET Image Initial (PI) Skull Base To Thigh  Addendum Date: 08/27/2019   ADDENDUM REPORT: 08/27/2019 17:27 ADDENDUM: These results will be called to the ordering clinician or representative by the Radiologist Assistant, and communication documented in the PACS or [employer]. Electronically Signed   By: Donzetta Kohut M.D.   On: 08/27/2019 17:27   Result Date: 08/27/2019 CLINICAL DATA:  Initial treatment strategy for breast cancer. EXAM: NUCLEAR MEDICINE PET SKULL BASE TO THIGH TECHNIQUE: 7.05 mCi F-18 FDG was injected intravenously. Full-ring PET imaging was performed from the skull base to thigh after the radiotracer. CT data was obtained and used for attenuation correction and anatomic localization. Fasting blood glucose: 78 mg/dl COMPARISON:  Spinal evaluations of 08/25/2019 FINDINGS: Mediastinal blood pool activity: SUV max 2.72 Liver activity: SUV max not applicable NECK: Asymmetric tonsillar activity RIGHT palatine tonsils approximately select approximately SUV 10.4, on the LEFT  4.5. No hypermetabolic lymph nodes in the neck, see below for thoracic inlet lymph nodes. Incidental CT findings: none CHEST: LEFT supraclavicular lymph node (image 55 of series 4) (SUVmax = 3.7 LEFT axillary lymph node (image 60, series 4) 7 mm (SUVmax = 5.1 with similar size and metabolically active lymph nodes in the area at least 7 additional lymph  nodes. Smaller mildly hypermetabolic lymph nodes tracking towards the LEFT supraclavicular region under LEFT subpectoral musculature with a tiny lymph node with the maximum SUV of 2.5 seen on image 57 of series 4 measuring 4 mm. See below for rib lesions.) Asymmetric soft tissue in the LEFT breast and marker for biopsy in an area with a maximum SUV of 7.9 that measures approximately 2.7 cm maximal dimension (image 80, series 4) Contralateral axillary lymph node (SUVmax = 2.6 (image 65, series 4) 6 mm.) Incidental CT findings: Mild calcified atheromatous plaque in the thoracic aorta. Heart size normal without pericardial effusion. Central pulmonary vasculature is normal caliber. Signs of basilar atelectasis question of background ground-glass but no consolidation, no pleural effusion or suspicious pulmonary nodule. ABDOMEN/PELVIS: No abnormal hypermetabolic activity within the liver, pancreas, adrenal glands, or spleen. No hypermetabolic lymph nodes in the abdomen or pelvis. Bilateral uptake in the adnexa along the expected course of the LEFT and RIGHT ureter is slightly more diffuse than expected for ureteral related activity but given bilateral appearance this is suspected Incidental CT findings: Cystic structures in the RIGHT adnexa without FDG uptake likely ovarian cysts largest measuring approximately 2.7 x 2.4 cm. RIGHT intrarenal calculus. Interpolar RIGHT kidney 4 mm. No acute gastrointestinal process. The appendix is normal. SKELETON: Diffuse skeletal metastatic disease. LEFT C1/skull base lesion with soft tissue at the foramen magnum (image 17, series 4)  (SUVmax = 9.2 soft tissue in this area measuring approximately 12 x 12 mm and extending into the occipital condyle on the LEFT likely and also involving C1. C2 metastatic focus markedly hypermetabolic (SUVmax = 9.1 associated with vertebral body destruction measuring approximately 9 x 6 mm. Destructive changes at the C5 level also with marked hypermetabolic activity. Lesion associated with vertebral body destruction at T5-T6 showing marked hypermetabolic activity better demonstrated on the thoracic spine MRI. Pathologic fracture also at the L1 level and with adjacent destruction of L2 is associated with hypermetabolic activity as are multiple other foci in the lumbar spine. Lesion without CT correlate in the RIGHT proximal humerus. Destructive lesion involving the RIGHT second rib (SUVmax = 13.6 signs of metastasis to the sternal body, LEFT anterior third rib, RIGHT hemi sacrum, bilateral iliac bones, LEFT ischium and RIGHT femoral neck with smaller focus in the LEFT femoral shaft. Of particular concern are lesions with bone destruction at the first sacral level within the sacral ala measuring 2.8 x 1.9 cm (SUVmax = 11.5 similar activity with some bone destruction along the sacroiliac joint measuring 1.4 by 0.8 cm adjacent to the sacral lesion. Another area of considerable concern is the cortical destruction and soft tissue associated with RIGHT femoral neck (image 216, series 4) (SUVmax = 12.9) This is followed by the diffuse activity and cortical destruction noted in the LEFT iliac bone which is associated with extensive periosteal reaction extending from the ischium through the iliac and associated with a small fracture in the nonweightbearing portion of the iliac bone but just above the LEFT acetabulum. (SUVmax = 13.7.) ) Incidental CT findings: none IMPRESSION: 1. 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 Redemonstration of metastatic, pathologic fractures associated  with multiple sites of metastatic disease throughout the spine. 2. Sites of concern for future pathologic fracture of the RIGHT sacral ala and RIGHT proximal femur. 3. Pathologic fracture along the anterior aspect of the LEFT iliac bone just above the acetabulum with diffuse iliac and ischial 4. Asymmetric tonsillar/base of tongue activity in the neck is of uncertain  significance. Direct clinical inspection or close attention on follow-up is suggested. 5. LEFT breast activity and adenopathy as discussed. 6. Area of activity in the bilateral adnexa favored to represent distal ureters. Attention on follow-up. Correlation could be made with contrasted imaging of the chest, abdomen and pelvis. Electronically Signed: By: Zetta Bills M.D. On: 08/27/2019 17:23   DG C-Arm 1-60 Min  Result Date: 09/01/2019 CLINICAL DATA:  Intramedullary nail insertion right femur secondary to metastatic disease and pinning fracture. EXAM: DG C-ARM 1-60 MIN; RIGHT FEMUR 2 VIEWS FLUOROSCOPY TIME:  Fluoroscopy Time:  3 minutes 45 seconds COMPARISON:  CT dated 08/27/2019 FINDINGS: C-arm images demonstrate insertion of intramedullary nail and proximal and distal screws in the right femur. The hardware appears in good position. Fracture. IMPRESSION: Satisfactory appearance of the right femur after intramedullary nail insertion. C-arm fluoroscopic images were obtained intraoperatively and submitted for post operative interpretation. Electronically Signed   By: Lorriane Shire M.D.   On: 09/01/2019 16:05   DG C-Arm 1-60 Min  Result Date: 09/01/2019 CLINICAL DATA:  Metastatic disease to the sacrum and pelvic bones. EXAM: DG C-ARM 1-60 MIN; SACRUM AND COCCYX - 2+ VIEW FLUOROSCOPY TIME:  Fluoroscopy Time:  3 minutes 45 seconds COMPARISON:  PET-CT dated 08/27/2019 FINDINGS: AP and lateral C-arm images demonstrate contrast in the right sacral ala and adjacent right iliac bone which correlates with metastatic lesions visible on PET-CT.  IMPRESSION: Cement augmentation of the right ilium and right sacral ala after radiofrequency ablation of metastatic disease. C-arm fluoroscopic images were obtained intraoperatively and submitted for post operative interpretation. Electronically Signed   By: Lorriane Shire M.D.   On: 09/01/2019 16:02   DG C-Arm 1-60 Min  Result Date: 08/29/2019 CLINICAL DATA:  Vertebral augmentation. EXAM: THORACIC SPINE 2 VIEWS; DG C-ARM 1-60 MIN COMPARISON:  MRI 08/25/2019 FINDINGS: Multiple C-arm images are provided. In the lumbar region, vertebral augmentation at L1 has a good appearance. Vertebral augmentation at the L4 level shows methylmethacrylate throughout the vertebral body, and also apparently based on this 1 projection a large amount within the spinal canal, measuring up to 9 mm in thickness and extending from the superior aspect of L4 down to the superior aspect of L5. This could result in compressive stenosis. Thoracic region imaging shows methylmethacrylate well distributed within the T6 vertebral body. There is probably some extension into the disc space. IMPRESSION: Vertebral augmentation at T6, L1 and L4. At L4, there appears to be ventral epidural cement measuring up to 9 mm in thickness. See above. Electronically Signed   By: Nelson Chimes M.D.   On: 08/29/2019 15:08   DG C-Arm 1-60 Min  Result Date: 08/29/2019 CLINICAL DATA:  Vertebral augmentation. EXAM: THORACIC SPINE 2 VIEWS; DG C-ARM 1-60 MIN COMPARISON:  MRI 08/25/2019 FINDINGS: Multiple C-arm images are provided. In the lumbar region, vertebral augmentation at L1 has a good appearance. Vertebral augmentation at the L4 level shows methylmethacrylate throughout the vertebral body, and also apparently based on this 1 projection a large amount within the spinal canal, measuring up to 9 mm in thickness and extending from the superior aspect of L4 down to the superior aspect of L5. This could result in compressive stenosis. Thoracic region imaging shows  methylmethacrylate well distributed within the T6 vertebral body. There is probably some extension into the disc space. IMPRESSION: Vertebral augmentation at T6, L1 and L4. At L4, there appears to be ventral epidural cement measuring up to 9 mm in thickness. See above. Electronically Signed  By: Nelson Chimes M.D.   On: 08/29/2019 15:08   DG Hip Unilat W or Wo Pelvis 2-3 Views Right  Result Date: 08/28/2019 CLINICAL DATA:  Right hip pain. History of metastatic breast cancer. EXAM: DG HIP (WITH OR WITHOUT PELVIS) 2-3V RIGHT COMPARISON:  None. FINDINGS: There is no evidence of hip fracture or dislocation. There is no evidence of arthropathy. A 4 mm round sclerotic focus is seen within the inter trochanteric region of the proximal right femur. IMPRESSION: Small sclerotic focus within the proximal right femur which may represent a small bone island. Given the patient's history of metastatic breast cancer, a small metastatic focus cannot be excluded. Electronically Signed   By: Virgina Norfolk M.D.   On: 08/28/2019 21:01   DG FEMUR, MIN 2 VIEWS RIGHT  Result Date: 09/01/2019 CLINICAL DATA:  Intramedullary nail insertion right femur secondary to metastatic disease and pinning fracture. EXAM: DG C-ARM 1-60 MIN; RIGHT FEMUR 2 VIEWS FLUOROSCOPY TIME:  Fluoroscopy Time:  3 minutes 45 seconds COMPARISON:  CT dated 08/27/2019 FINDINGS: C-arm images demonstrate insertion of intramedullary nail and proximal and distal screws in the right femur. The hardware appears in good position. Fracture. IMPRESSION: Satisfactory appearance of the right femur after intramedullary nail insertion. C-arm fluoroscopic images were obtained intraoperatively and submitted for post operative interpretation. Electronically Signed   By: Lorriane Shire M.D.   On: 09/01/2019 16:05     Assessment and plan- Patient is a 39 y.o. 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   She is here for  on treatment assessment prior to cycle 2-day 1 of Herceptin Perjeta and Taxol  Counts okay to proceed with cycle 2-day 1 of Herceptin Perjeta and Taxol today.  Overall patient is tolerating chemotherapy well.  She will proceed for weekly Taxol next week and I will see her back in 2 weeks for cycle 2-day 15 of Taxol.  Bone metastases: Patient is receiving palliative radiation to the right sacral region and proximal femur and will complete radiation on 10/01/2019.  I will plan to start Xgeva when I see her back in 2 weeks for her bone metastases  Neoplasm related pain: Continue OxyContin 10 mg every 12 and oxycodone every 4 hours as needed  Anxiety: Continue as needed Ativan  Abnormal LFTs: Stable to improving.  Continue to monitor.  Elevated alkaline phosphatase likely secondary to bone metastases   Visit Diagnosis 1. Encounter for antineoplastic chemotherapy   2. Neoplasm related pain   3. Bone metastases ([place])      Dr. Randa Evens, MD, MPH [facility] at [facility] [phone] 09/26/2019 10:04 AM

## 2019-09-27 LAB — CANCER ANTIGEN 27.29: CA 27.29: 90.5 U/mL — ABNORMAL HIGH (ref 0.0–38.6)

## 2019-09-27 LAB — CANCER ANTIGEN 15-3: CA 15-3: 81.7 U/mL — ABNORMAL HIGH (ref 0.0–25.0)

## 2019-09-29 ENCOUNTER — Inpatient Hospital Stay: Payer: Self-pay

## 2019-09-29 ENCOUNTER — Ambulatory Visit
Admission: RE | Admit: 2019-09-29 | Discharge: 2019-09-29 | Disposition: A | Discharge status: Home / Self Care | Payer: Self-pay | Source: Ambulatory Visit | Attending: Radiation Oncology | Admitting: Radiation Oncology

## 2019-09-30 ENCOUNTER — Encounter: Payer: Self-pay | Admitting: *Deleted

## 2019-09-30 ENCOUNTER — Other Ambulatory Visit: Payer: Self-pay

## 2019-09-30 ENCOUNTER — Ambulatory Visit
Admission: RE | Admit: 2019-09-30 | Discharge: 2019-09-30 | Disposition: A | Discharge status: Home / Self Care | Payer: Self-pay | Source: Ambulatory Visit | Attending: Radiation Oncology | Admitting: Radiation Oncology

## 2019-09-30 ENCOUNTER — Inpatient Hospital Stay: Payer: Self-pay

## 2019-09-30 ENCOUNTER — Encounter: Payer: Self-pay | Admitting: Oncology

## 2019-09-30 ENCOUNTER — Inpatient Hospital Stay (HOSPITAL_BASED_OUTPATIENT_CLINIC_OR_DEPARTMENT_OTHER): Payer: Self-pay | Admitting: Oncology

## 2019-09-30 ENCOUNTER — Telehealth: Payer: Self-pay | Admitting: *Deleted

## 2019-09-30 ENCOUNTER — Encounter: Payer: Self-pay | Admitting: Pharmacy Technician

## 2019-09-30 VITALS — BP 122/87 | HR 101 | Temp 98.6°F | Resp 16

## 2019-09-30 DIAGNOSIS — Z923 Personal history of irradiation: Secondary | ICD-10-CM

## 2019-09-30 DIAGNOSIS — K529 Noninfective gastroenteritis and colitis, unspecified: Principal | ICD-10-CM | POA: Diagnosis present

## 2019-09-30 DIAGNOSIS — Z9221 Personal history of antineoplastic chemotherapy: Secondary | ICD-10-CM

## 2019-09-30 DIAGNOSIS — C50919 Malignant neoplasm of unspecified site of unspecified female breast: Secondary | ICD-10-CM

## 2019-09-30 DIAGNOSIS — C7951 Secondary malignant neoplasm of bone: Secondary | ICD-10-CM | POA: Diagnosis present

## 2019-09-30 DIAGNOSIS — Z79891 Long term (current) use of opiate analgesic: Secondary | ICD-10-CM

## 2019-09-30 DIAGNOSIS — C50912 Malignant neoplasm of unspecified site of left female breast: Secondary | ICD-10-CM | POA: Diagnosis present

## 2019-09-30 DIAGNOSIS — G893 Neoplasm related pain (acute) (chronic): Secondary | ICD-10-CM | POA: Diagnosis present

## 2019-09-30 DIAGNOSIS — E876 Hypokalemia: Secondary | ICD-10-CM | POA: Diagnosis present

## 2019-09-30 DIAGNOSIS — R109 Unspecified abdominal pain: Secondary | ICD-10-CM

## 2019-09-30 DIAGNOSIS — D509 Iron deficiency anemia, unspecified: Secondary | ICD-10-CM | POA: Diagnosis present

## 2019-09-30 DIAGNOSIS — Z79899 Other long term (current) drug therapy: Secondary | ICD-10-CM

## 2019-09-30 DIAGNOSIS — F419 Anxiety disorder, unspecified: Secondary | ICD-10-CM | POA: Diagnosis present

## 2019-09-30 DIAGNOSIS — Z20822 Contact with and (suspected) exposure to covid-19: Secondary | ICD-10-CM | POA: Diagnosis present

## 2019-09-30 DIAGNOSIS — Z800 Family history of malignant neoplasm of digestive organs: Secondary | ICD-10-CM

## 2019-09-30 DIAGNOSIS — K449 Diaphragmatic hernia without obstruction or gangrene: Secondary | ICD-10-CM | POA: Diagnosis present

## 2019-09-30 DIAGNOSIS — K219 Gastro-esophageal reflux disease without esophagitis: Secondary | ICD-10-CM | POA: Diagnosis present

## 2019-09-30 DIAGNOSIS — K2951 Unspecified chronic gastritis with bleeding: Secondary | ICD-10-CM | POA: Diagnosis not present

## 2019-09-30 DIAGNOSIS — R945 Abnormal results of liver function studies: Secondary | ICD-10-CM | POA: Diagnosis present

## 2019-09-30 MED ORDER — PANTOPRAZOLE SODIUM 40 MG PO TBEC
40.0000 mg | DELAYED_RELEASE_TABLET | Freq: Every day | ORAL | 1 refills | Status: DC
Start: 2019-09-30 — End: 2019-12-19

## 2019-09-30 NOTE — Telephone Encounter (Signed)
Patient left message on triage phone line to please call her, no other information. Writer returned call and left message on answering machine.

## 2019-09-30 NOTE — Progress Notes (Signed)
Patient has been approved for drug assistance by [employer] for [employer]. The enrollment period is from 09/30/19-09/29/20 based on self pay. First DOS covered is 10/10/19.

## 2019-09-30 NOTE — Progress Notes (Signed)
Pt has cold spells and then has sweating and it goes back and forth. No fever. everytime pt eats and with drinking- she hurts severely in upper left and right quadrant. She had diarrhea over the weekend but it stopped mon. Not drinking eating hardly at all today due to pain. No nausea

## 2019-09-30 NOTE — Telephone Encounter (Signed)
Patient called back reporting that she is having mid abdominal stomach pain. She denies fever, N/V, but does report that she had diarrhea Saturday for which she took Imodium to control. She is asking what to do for her pain. Please Arnoldo Hooker

## 2019-10-01 ENCOUNTER — Emergency Department: Payer: Self-pay

## 2019-10-01 ENCOUNTER — Ambulatory Visit: Payer: Self-pay

## 2019-10-01 ENCOUNTER — Other Ambulatory Visit: Payer: Self-pay

## 2019-10-01 ENCOUNTER — Inpatient Hospital Stay
Admission: EM | Admit: 2019-10-01 | Discharge: 2019-10-05 | DRG: 391 | Disposition: A | Discharge status: Home / Self Care | Payer: Self-pay | Attending: Internal Medicine | Admitting: Internal Medicine

## 2019-10-01 DIAGNOSIS — C50919 Malignant neoplasm of unspecified site of unspecified female breast: Secondary | ICD-10-CM | POA: Diagnosis present

## 2019-10-01 DIAGNOSIS — R7989 Other specified abnormal findings of blood chemistry: Secondary | ICD-10-CM | POA: Diagnosis present

## 2019-10-01 DIAGNOSIS — R1084 Generalized abdominal pain: Secondary | ICD-10-CM

## 2019-10-01 DIAGNOSIS — K529 Noninfective gastroenteritis and colitis, unspecified: Secondary | ICD-10-CM | POA: Insufficient documentation

## 2019-10-01 DIAGNOSIS — R109 Unspecified abdominal pain: Secondary | ICD-10-CM

## 2019-10-01 DIAGNOSIS — R945 Abnormal results of liver function studies: Secondary | ICD-10-CM | POA: Diagnosis present

## 2019-10-01 DIAGNOSIS — F419 Anxiety disorder, unspecified: Secondary | ICD-10-CM

## 2019-10-01 LAB — CBC
HCT: 35.4 % — ABNORMAL LOW (ref 36.0–46.0)
Hemoglobin: 11.3 g/dL — ABNORMAL LOW (ref 12.0–15.0)
MCH: 29 pg (ref 26.0–34.0)
MCHC: 31.9 g/dL (ref 30.0–36.0)
MCV: 90.8 fL (ref 80.0–100.0)
Platelets: 416 10*3/uL — ABNORMAL HIGH (ref 150–400)
RBC: 3.9 MIL/uL (ref 3.87–5.11)
RDW: 15.5 % (ref 11.5–15.5)
WBC: 5.7 10*3/uL (ref 4.0–10.5)
nRBC: 0 % (ref 0.0–0.2)

## 2019-10-01 LAB — COMPREHENSIVE METABOLIC PANEL
ALT: 69 U/L — ABNORMAL HIGH (ref 0–44)
AST: 48 U/L — ABNORMAL HIGH (ref 15–41)
Albumin: 3.7 g/dL (ref 3.5–5.0)
Alkaline Phosphatase: 167 U/L — ABNORMAL HIGH (ref 38–126)
Anion gap: 7 (ref 5–15)
BUN: 11 mg/dL (ref 6–20)
CO2: 29 mmol/L (ref 22–32)
Calcium: 9 mg/dL (ref 8.9–10.3)
Chloride: 101 mmol/L (ref 98–111)
Creatinine, Ser: 0.56 mg/dL (ref 0.44–1.00)
GFR calc Af Amer: >60 mL/min (ref >60)
GFR calc non Af Amer: >60 mL/min (ref >60)
Glucose, Bld: 126 mg/dL — ABNORMAL HIGH (ref 70–99)
Potassium: 3.9 mmol/L (ref 3.5–5.1)
Sodium: 137 mmol/L (ref 135–145)
Total Bilirubin: 0.7 mg/dL (ref 0.3–1.2)
Total Protein: 7.4 g/dL (ref 6.5–8.1)

## 2019-10-01 LAB — GASTROINTESTINAL PANEL BY PCR, STOOL (REPLACES STOOL CULTURE)

## 2019-10-01 LAB — C DIFFICILE QUICK SCREEN W PCR REFLEX
C Diff antigen: NEGATIVE
C Diff interpretation: NOT DETECTED
C Diff toxin: NEGATIVE

## 2019-10-01 LAB — HEPATITIS PANEL, ACUTE
HCV Ab: NONREACTIVE
Hep A IgM: NONREACTIVE
Hep B C IgM: NONREACTIVE
Hepatitis B Surface Ag: NONREACTIVE

## 2019-10-01 LAB — URINALYSIS, COMPLETE (UACMP) WITH MICROSCOPIC
Bilirubin Urine: NEGATIVE
Glucose, UA: NEGATIVE mg/dL
Hgb urine dipstick: NEGATIVE
Ketones, ur: 5 mg/dL — AB
Nitrite: NEGATIVE
Protein, ur: NEGATIVE mg/dL
Specific Gravity, Urine: 1.018 (ref 1.005–1.030)
WBC, UA: >50 WBC/hpf — ABNORMAL HIGH (ref 0–5)
pH: 6 (ref 5.0–8.0)

## 2019-10-01 LAB — POCT PREGNANCY, URINE: Preg Test, Ur: NEGATIVE

## 2019-10-01 LAB — LIPASE, BLOOD: Lipase: 40 U/L (ref 11–51)

## 2019-10-01 LAB — SARS CORONAVIRUS 2 BY RT PCR (HOSPITAL ORDER, PERFORMED IN ~~LOC~~ HOSPITAL LAB): SARS Coronavirus 2: NEGATIVE

## 2019-10-01 IMAGING — CT CT ABD-PELV W/ CM
2 of 4 series · 15 of 46 positions shown, 17 images · IV contrast (APPLIED)
Comparison: None.

CLINICAL DATA: History of metastatic breast cancer. Abdominal pain
for 4 days.

EXAM:
CT ABDOMEN AND PELVIS WITH CONTRAST
TECHNIQUE: Multidetector CT imaging of the abdomen and pelvis was performed
using the standard protocol following bolus administration of
intravenous contrast.
CONTRAST:  75mL OMNIPAQUE IOHEXOL 300 MG/ML  SOLN

[Series 2: routine abd/pel with · axial · 0.80mm/px · z∈[-806,-366]mm · 12 of 96 slices shown, 14 images]
[im 4/96  soft-tissue]
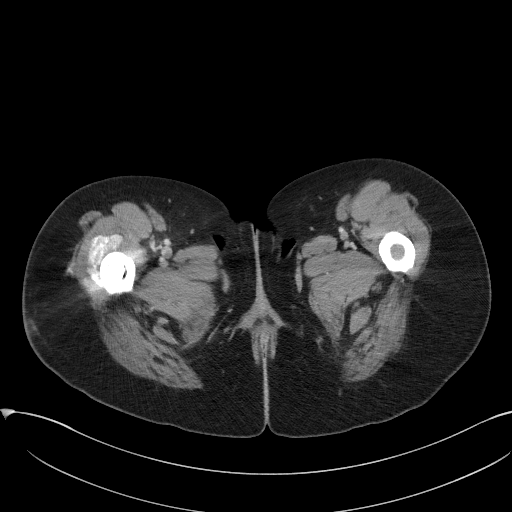
[im 4/96  bone]
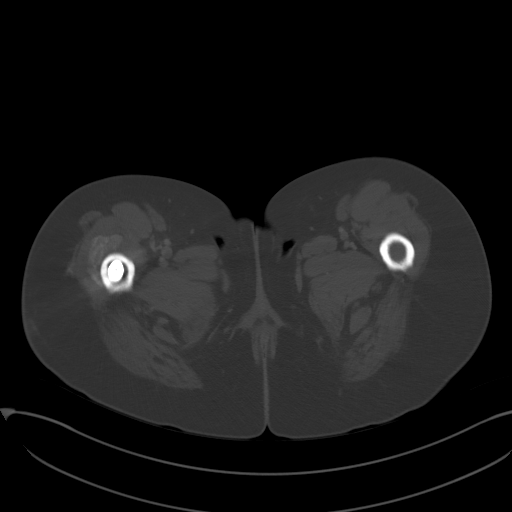
[im 12/96  soft-tissue]
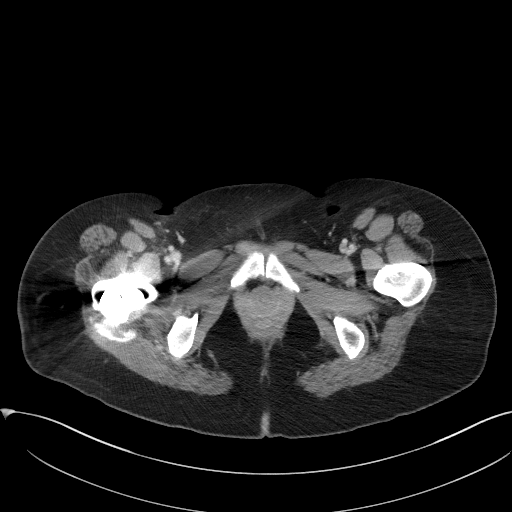
[im 20/96  soft-tissue]
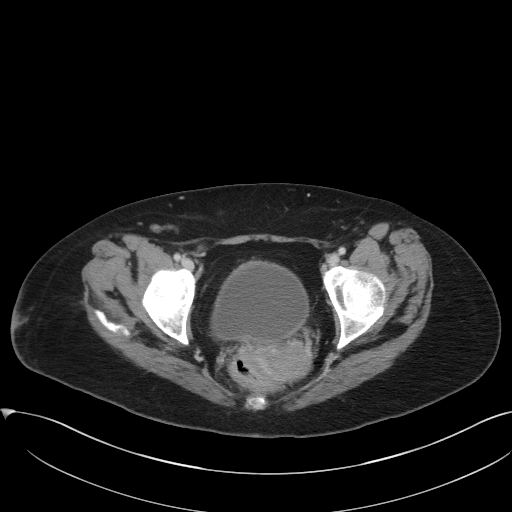
[im 28/96  soft-tissue]
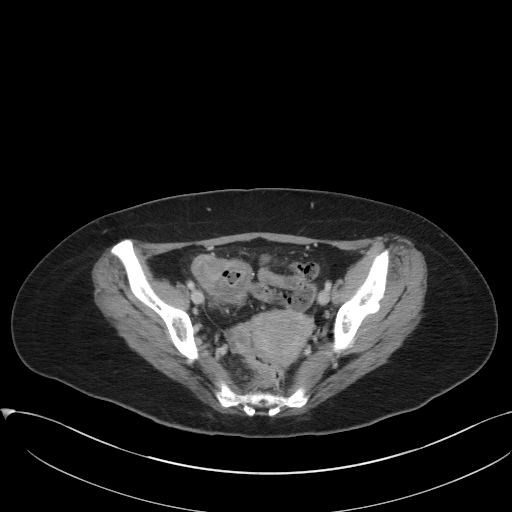
[im 36/96  soft-tissue]
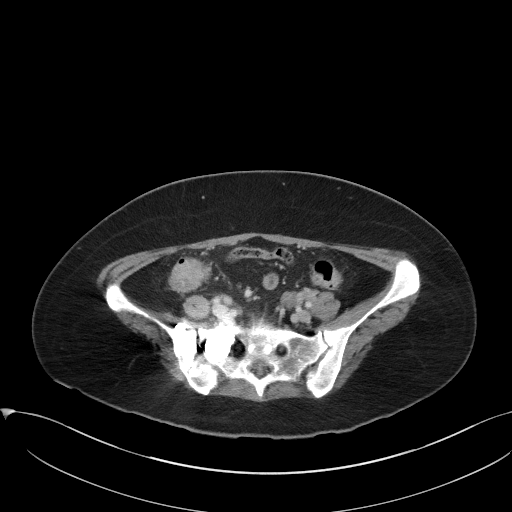
[im 44/96  soft-tissue]
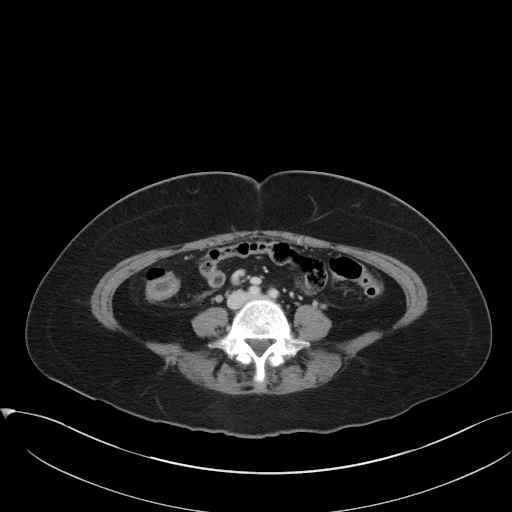
[im 52/96  soft-tissue]
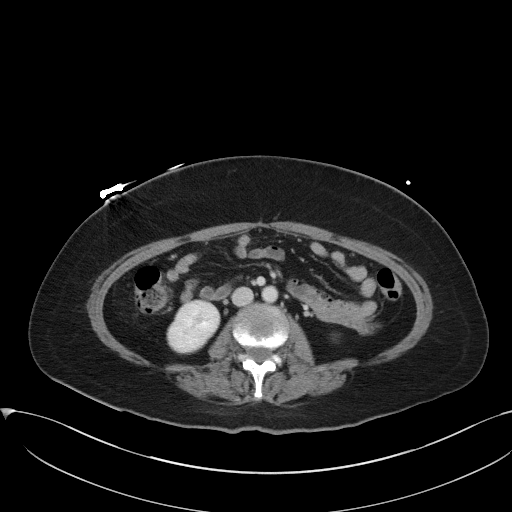
[im 60/96  soft-tissue]
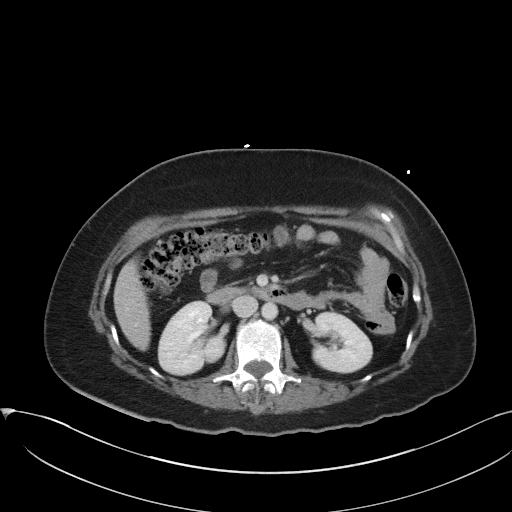
[im 68/96  soft-tissue]
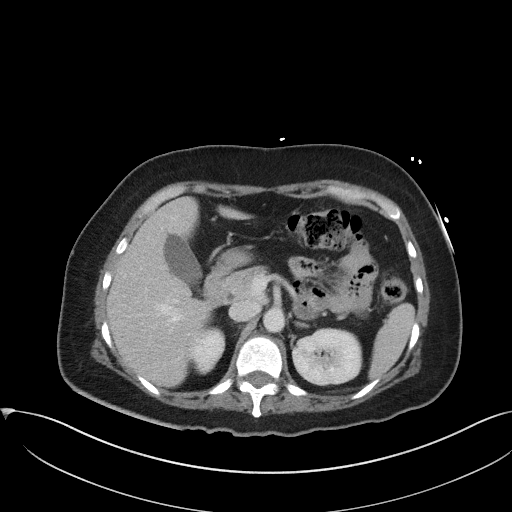
[im 68/96  bone]
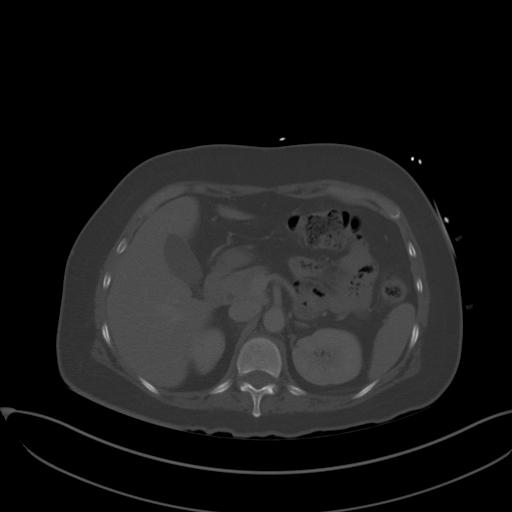
[im 76/96  soft-tissue]
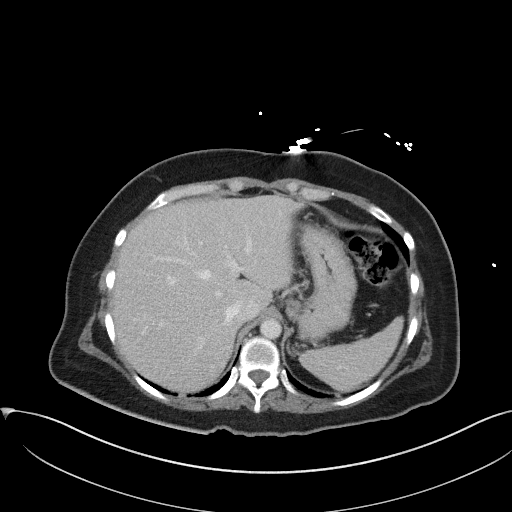
[im 84/96  soft-tissue]
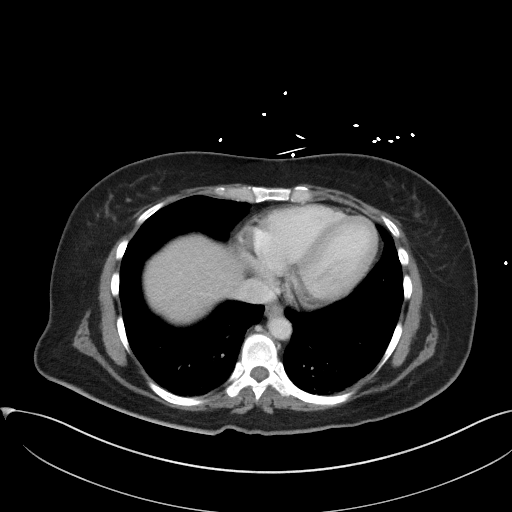
[im 92/96  soft-tissue]
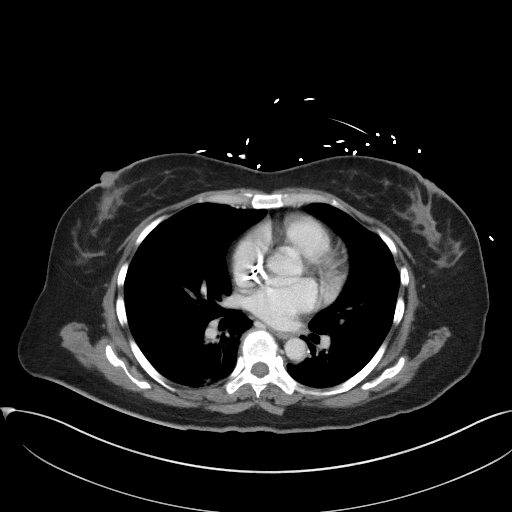

[Series 5: coronal st · coronal · 0.78mm/px · 3 of 80 slices shown]
[im 27/80  soft-tissue]
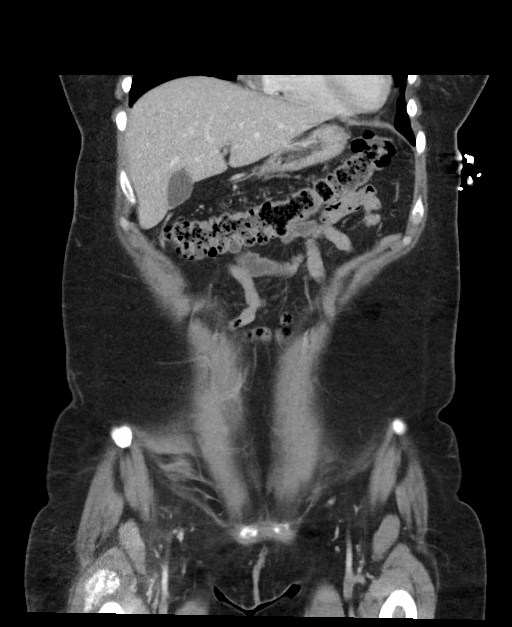
[im 36/80  soft-tissue]
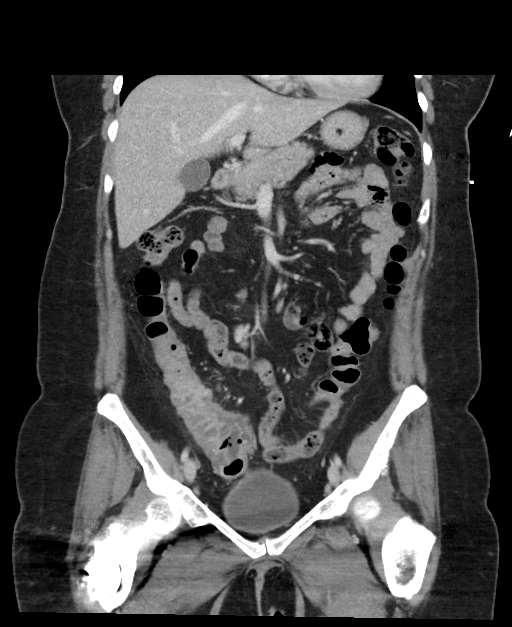
[im 44/80  soft-tissue]
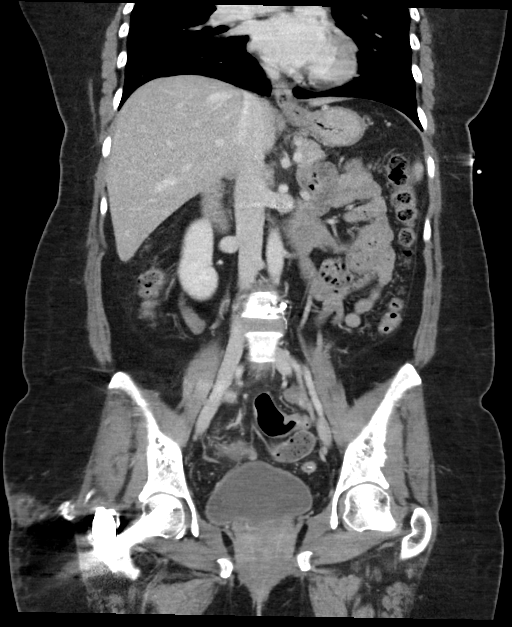

[15 of 46 positions shown; findings below may reference images not displayed]

FINDINGS: Lower chest: Streaky basilar atelectasis but no infiltrates or
effusions. The heart is normal in size. No pericardial effusion. No
worrisome pulmonary nodules.

Hepatobiliary: No worrisome hepatic lesions to suggest metastatic
disease. The gallbladder is normal. No common bile duct dilatation.

Pancreas: No mass, inflammation or ductal dilatation.

Spleen: Normal size. No focal lesions.

Adrenals/Urinary Tract: The adrenal glands and kidneys are
unremarkable. No renal lesions or hydronephrosis. The bladder is
unremarkable.

Stomach/Bowel: The stomach is unremarkable. The duodenum and small
bowel are unremarkable without oral contrast. No acute inflammatory
changes, mass lesions or obstructive findings.

Moderate inflammatory or infectious process involving the cecum and
part of the ascending colon. There may be some mild associated
secondary inflammation of the terminal ileum. The appendix is also
secondarily inflamed with mild mucosal enhancement. There is also a
small appendicoliths but I do not see any findings suspicious for
acute appendicitis. The remainder of the colon is unremarkable.
Scattered diverticulosis is noted.

Vascular/Lymphatic: The aorta and branch vessels are patent. The
major venous structures are patent. No mesenteric or retroperitoneal
mass or lymphadenopathy.

Reproductive: The uterus and ovaries are unremarkable.

Other: No significant free abdominal or free pelvic fluid
collections. No abdominal wall hernia or subcutaneous lesions.

Musculoskeletal: Findings of diffuse osseous metastatic disease.
Vertebral augmentation changes are noted at T6, L1 and L4.
Right-sided sacral plasty also noted along with bone cement in the
right iliac bone. Right hip hardware is noted.

Lytic lesion noted in the L2 vertebral body. No spinal canal
encroachment. There is a large amount of bone cement noted in the
spinal canal at L4.
IMPRESSION: 1. Moderate inflammatory or infectious process involving the cecum
and part of the ascending colon with mild associated secondary
inflammation of the terminal ileum.
2. The appendix is also secondarily inflamed with mild mucosal
enhancement. There is also a small appendicoliths but no findings
suspicious for acute appendicitis.
3. Findings of diffuse osseous metastatic disease as detailed above.

## 2019-10-01 IMAGING — DX DG CHEST 1V PORT
1 series · 1 of 1 positions shown · non-contrast
Comparison: None.

CLINICAL DATA: Port-A-Cath placement.

EXAM:
PORTABLE CHEST 1 VIEW

[chest ap]
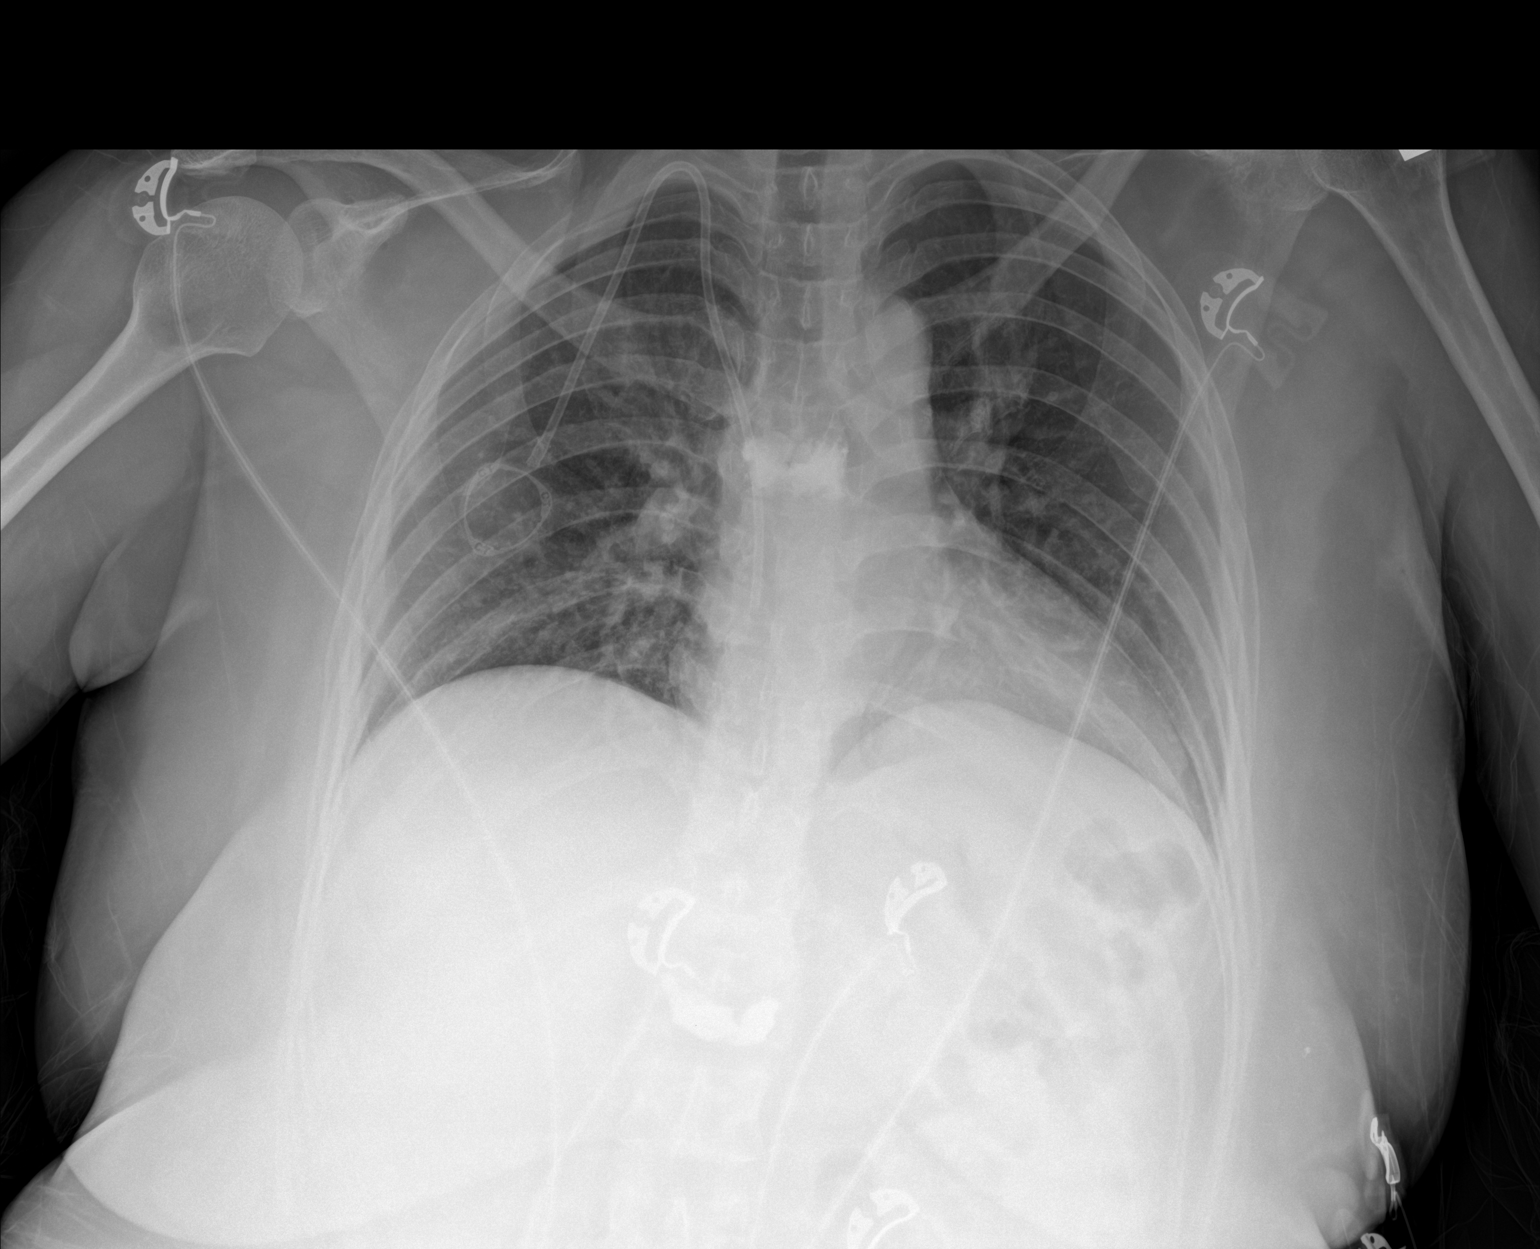

[1 of 1 positions shown; findings below may reference images not displayed]

FINDINGS: Right IJ power port is in good position with its tip in the distal
SVC near the cavoatrial junction. No complicating features are
identified.

The cardiac silhouette, mediastinal and hilar contours are within
normal limits given the AP projection and portable technique. Low
lung volumes with vascular crowding and streaky basilar atelectasis
but no infiltrates or effusions. No pneumothorax. The bony thorax is
intact. Vertebral augmentation changes are noted.
IMPRESSION: Right IJ power port in good position without complicating features.

## 2019-10-01 MED ORDER — SODIUM CHLORIDE 0.9 % IV BOLUS
500.0000 mL | Freq: Once | INTRAVENOUS | Status: AC
  Administered 2019-10-01: 500 mL via INTRAVENOUS

## 2019-10-01 MED ORDER — ONDANSETRON HCL 4 MG/2ML IJ SOLN
4.0000 mg | Freq: Three times a day (TID) | INTRAMUSCULAR | Status: DC | PRN
  Administered 2019-10-04: 22:00:00 4 mg via INTRAVENOUS
  Filled 2019-10-01: qty 2

## 2019-10-01 MED ORDER — HYDROXYZINE HCL 10 MG PO TABS
10.0000 mg | ORAL_TABLET | Freq: Three times a day (TID) | ORAL | Status: DC | PRN
  Administered 2019-10-03 – 2019-10-05 (×3): 10 mg via ORAL
  Filled 2019-10-01 (×4): qty 1

## 2019-10-01 MED ORDER — HYDROMORPHONE HCL 1 MG/ML IJ SOLN
0.5000 mg | Freq: Once | INTRAMUSCULAR | Status: AC
  Administered 2019-10-01: 0.5 mg via INTRAVENOUS
  Filled 2019-10-01: qty 1

## 2019-10-01 MED ORDER — ONDANSETRON HCL 4 MG/2ML IJ SOLN
4.0000 mg | Freq: Once | INTRAMUSCULAR | Status: AC
  Administered 2019-10-01: 4 mg via INTRAVENOUS
  Filled 2019-10-01: qty 2

## 2019-10-01 MED ORDER — SODIUM CHLORIDE 0.9 % IV BOLUS
1000.0000 mL | Freq: Once | INTRAVENOUS | Status: AC
  Administered 2019-10-01: 1000 mL via INTRAVENOUS

## 2019-10-01 MED ORDER — HYDROMORPHONE HCL 1 MG/ML IJ SOLN
0.5000 mg | INTRAMUSCULAR | Status: DC | PRN
  Administered 2019-10-01 – 2019-10-02 (×3): 0.5 mg via INTRAVENOUS
  Filled 2019-10-01 (×3): qty 1

## 2019-10-01 MED ORDER — PIPERACILLIN-TAZOBACTAM 3.375 G IVPB 30 MIN
3.3750 g | Freq: Once | INTRAVENOUS | Status: AC
  Administered 2019-10-01: 3.375 g via INTRAVENOUS
  Filled 2019-10-01: qty 50

## 2019-10-01 MED ORDER — MORPHINE SULFATE (PF) 2 MG/ML IV SOLN
2.0000 mg | INTRAVENOUS | Status: DC | PRN
  Administered 2019-10-01 – 2019-10-02 (×3): 2 mg via INTRAVENOUS
  Filled 2019-10-01 (×3): qty 1

## 2019-10-01 MED ORDER — GABAPENTIN 300 MG PO CAPS
300.0000 mg | ORAL_CAPSULE | Freq: Two times a day (BID) | ORAL | Status: DC
  Administered 2019-10-01 – 2019-10-04 (×7): 300 mg via ORAL
  Filled 2019-10-01 (×7): qty 1

## 2019-10-01 MED ORDER — PANTOPRAZOLE SODIUM 40 MG IV SOLR
40.0000 mg | Freq: Once | INTRAVENOUS | Status: AC
  Administered 2019-10-01: 40 mg via INTRAVENOUS
  Filled 2019-10-01: qty 40

## 2019-10-01 MED ORDER — SODIUM CHLORIDE 0.9 % IV SOLN: INTRAVENOUS | Status: DC

## 2019-10-01 MED ORDER — CHLORHEXIDINE GLUCONATE CLOTH 2 % EX PADS
6.0000 | MEDICATED_PAD | Freq: Every day | CUTANEOUS | Status: DC
  Administered 2019-10-02 – 2019-10-05 (×4): 6 via TOPICAL

## 2019-10-01 MED ORDER — PANTOPRAZOLE SODIUM 40 MG PO TBEC
40.0000 mg | DELAYED_RELEASE_TABLET | Freq: Every day | ORAL | Status: DC
  Administered 2019-10-02: 40 mg via ORAL
  Filled 2019-10-01: qty 1

## 2019-10-01 MED ORDER — HYDROMORPHONE HCL 1 MG/ML IJ SOLN: 1.0000 mg | Freq: Once | INTRAMUSCULAR | Status: DC

## 2019-10-01 MED ORDER — OXYCODONE HCL 5 MG PO TABS
5.0000 mg | ORAL_TABLET | ORAL | Status: DC | PRN
  Administered 2019-10-02: 5 mg via ORAL
  Filled 2019-10-01: qty 1

## 2019-10-01 MED ORDER — LORAZEPAM 0.5 MG PO TABS
0.5000 mg | ORAL_TABLET | Freq: Four times a day (QID) | ORAL | Status: DC | PRN
  Administered 2019-10-03 – 2019-10-04 (×3): 0.5 mg via ORAL
  Filled 2019-10-01 (×3): qty 1

## 2019-10-01 MED ORDER — PIPERACILLIN-TAZOBACTAM 3.375 G IVPB
3.3750 g | Freq: Three times a day (TID) | INTRAVENOUS | Status: DC
  Administered 2019-10-01 – 2019-10-02 (×3): 3.375 g via INTRAVENOUS
  Filled 2019-10-01 (×5): qty 50

## 2019-10-01 MED ORDER — IOHEXOL 300 MG/ML  SOLN
75.0000 mL | Freq: Once | INTRAMUSCULAR | Status: AC | PRN
  Administered 2019-10-01: 75 mL via INTRAVENOUS

## 2019-10-01 MED ORDER — ENOXAPARIN SODIUM 40 MG/0.4ML ~~LOC~~ SOLN
40.0000 mg | SUBCUTANEOUS | Status: DC
  Administered 2019-10-01 – 2019-10-05 (×5): 40 mg via SUBCUTANEOUS
  Filled 2019-10-01 (×5): qty 0.4

## 2019-10-01 NOTE — Progress Notes (Signed)
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 History obtained with the help of an interpreter.Patient self palpated left breast mass which was followed by a diagnostic bilateral mammogram. Mammogram showed 3.1 x 2.9 x 1.9 cm hypoechoic mass at the 1 o'clock position of the left breast. For abnormal cortically thickened left axillary lymph nodes measuring up to 5 mm. 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 Patient was also having ongoing back pain and was seen by [facility] orthopedics Dr. Doyle Askew who ordered MRI lumbar spine without contrast which showed possible pathologic fractures of L1 and L4 vertebral bodies with greater than 50% height loss at L1 and abnormal signal involving L2-L3 S1 as well as right iliac bone concerning for metastatic disease.Patient is a single mother of 3 adult children and is very anxious today.She reports significant back pain which radiates to her bilateral thighs. Denies any focal tingling numbness or weakness. Denies any bowel bladder incontinence. Pain has been uncontrolled despite taking Tylenol. No prior history of abnormal breast biopsies. No family history of breast cancer  PET and MRIshowed 3 areas of pathologic fracture of her spine as well as widespread bony  metastatic disease and concern for impending fracture of the right hip. Given her worsening pain she was asked to come to the ER. She has been evaluated by Dr. Rudene Christians from orthopedic surgery and underwent kyphoplasty at 3 different levels.T6 L1 and L4 along with radiofrequency ablation.She also underwent prophylactic fixation of the right hip and not affected the sacral region.  Patient received first dose of Herceptin and Perjeta on 09/04/2019. Baseline echocardiogram normal.she is currently getting taxol/ herceptin/perjeta  Interval history- History obtained with the help of spanish interpretor. Patient has been having mild intermittent diarrhea since she started taking herceptin/perjeta. She has been using prn imodium for that. Last episode of diarrhea was 2 days ago.  However patient has been having acute onset abdominal pain since last 3 days. Pain is epigastric and she reports pain when she eats. Denies any blood in stools. Denies any fever. Pain medications have not helped her with pain  ECOG PS-1 Pain scale- 8 Opioid associated constipation- no  Review of systems- Review of Systems  Constitutional: Positive for malaise/fatigue. Negative for chills, fever and weight loss.  HENT: Negative for congestion, ear discharge and nosebleeds.   Eyes: Negative for blurred vision.  Respiratory: Negative for cough, hemoptysis, sputum production, shortness of breath and wheezing.   Cardiovascular: Negative for chest pain, palpitations, orthopnea and claudication.  Gastrointestinal: Positive for abdominal pain and diarrhea. Negative for blood in stool, constipation, heartburn, melena, nausea and vomiting.  Genitourinary: Negative for dysuria, flank pain, frequency, hematuria and urgency.  Musculoskeletal: Negative for back pain, joint pain and myalgias.  Skin: Negative for rash.  Neurological: Negative for dizziness, tingling, focal weakness, seizures, weakness and headaches.  Endo/Heme/Allergies: Does not bruise/bleed easily.  Psychiatric/Behavioral: Negative for depression and suicidal ideas. The patient does not have insomnia.        No Known Allergies   Past Medical History:  Diagnosis Date  . Anxiety   . Cancer ([place])   . Vertigo      Past Surgical History:  Procedure Laterality Date  . BREAST BIOPSY Left 08/14/2019   [place] bx of mass, coil marker, path pending  . BREAST BIOPSY Left 08/14/2019   [place] bx of LN, hydromarker, path pending  . BREAST BIOPSY Left 08/14/2019   affirm bx of calcs, x marker, path pending  . INTRAMEDULLARY (IM) NAIL INTERTROCHANTERIC Right 09/01/2019   Procedure: INTRAMEDULLARY (IM) NAIL INTERTROCHANTRIC AND RADIOFREQUENCY ABLATION;  Surgeon: Hessie Knows, MD;  Location: [facility] ORS;  Service: Orthopedics;  Laterality: Right;  . KYPHOPLASTY N/A 08/29/2019   Procedure: KYPHOPLASTY T6, L1,L4 ,  RADIOFREQUENCY ABLATION;  Surgeon: Hessie Knows, MD;  Location: [facility] ORS;  Service: Orthopedics;  Laterality: N/A;  . KYPHOPLASTY Right 09/01/2019   Procedure: Right Sacral Radiofrequency Ablation and Cement Augmentation, Right sacrum and iliac crest;  Surgeon: Hessie Knows, MD;  Location: [facility] ORS;  Service: Orthopedics;  Laterality: Right;  . PORTA CATH INSERTION N/A 08/28/2019   Procedure: PORTA CATH INSERTION;  Surgeon: Algernon Huxley, MD;  Location: [facility] [facility];  Service: Cardiovascular;  Laterality: N/A;    Social History   Socioeconomic History  . Marital status: Single    Spouse name: Not on file  . Number of children: Not on file  . Years of education: Not on file  . Highest education level: Not on file  Occupational History  . Not on file  Tobacco Use  . Smoking status: Never Smoker  . Smokeless tobacco: Never Used  Vaping Use  . Vaping Use: Never used  Substance and Sexual Activity  . Alcohol use: Yes    Comment: occassionaly  . Drug use: Not Currently  . Sexual activity: Not Currently    Birth  control/protection: None  Other Topics Concern  . Not on file  Social History Narrative   Lives at home with children   Social Determinants of Health   Financial Resource Strain:   . Difficulty of Paying Living Expenses:   Food Insecurity:   . Worried About Charity fundraiser in the Last Year:   . Arboriculturist in the Last Year:   Transportation Needs:   . Film/video editor (Medical):   Marland Kitchen Lack of Transportation (Non-Medical):   Physical Activity:   . Days of Exercise per Week:   . Minutes of Exercise per Session:   Stress:   . Feeling of Stress :   Social Connections:   . Frequency of Communication with Friends and Family:   . Frequency of Social Gatherings with Friends and Family:   . Attends Religious Services:   . Active Member of Clubs or Organizations:   . Attends Archivist Meetings:   Marland Kitchen Marital Status:   Intimate Partner Violence:   . Fear of Current or Ex-Partner:   . Emotionally Abused:   Marland Kitchen Physically Abused:   . Sexually Abused:     Family History  Problem Relation Age of Onset  . Colon cancer Maternal Uncle     No current facility-administered medications for this visit.  Current Outpatient Medications:  .  gabapentin (NEURONTIN) 300 MG capsule, Take 1 capsule (300 mg total) by mouth 2 (two) times daily., Disp: 60  capsule, Rfl: 1 .  gabapentin (NEURONTIN) 300 MG capsule, Take 1 capsule (300 mg total) by mouth 2 (two) times daily., Disp: 60 capsule, Rfl: 1 .  hydrOXYzine (ATARAX/VISTARIL) 10 MG tablet, Take 1 tablet (10 mg total) by mouth 3 (three) times daily as needed for anxiety., Disp: 30 tablet, Rfl: 0 .  lidocaine-prilocaine (EMLA) cream, Apply 1 application topically as needed. Apply small amount to port site at least 1 hour prior to it being accessed, cover with plastic wrap, Disp: 30 g, Rfl: 1 .  LORazepam (ATIVAN) 0.5 MG tablet, Take 1 tablet (0.5 mg total) by mouth every 6 (six) hours as needed for anxiety., Disp: 30 tablet, Rfl: 0  .  oxyCODONE (OXY IR/ROXICODONE) 5 MG immediate release tablet, Take 1-2 tablets (5-10 mg total) by mouth every 4 (four) hours as needed for moderate pain., Disp: 120 tablet, Rfl: 0 .  oxyCODONE (OXYCONTIN) 10 mg 12 hr tablet, Take 1 tablet (10 mg total) by mouth every 12 (twelve) hours., Disp: 60 tablet, Rfl: 0 .  senna-docusate (SENOKOT-S) 8.6-50 MG tablet, Take 1 tablet by mouth 2 (two) times daily., Disp: 30 tablet, Rfl: 1 .  enoxaparin (LOVENOX) 40 MG/0.4ML injection, Inject 0.4 mLs (40 mg total) into the skin daily for 14 days., Disp: 5.6 mL, Rfl: 0 .  ondansetron (ZOFRAN) 4 MG tablet, Take 1 tablet (4 mg total) by mouth every 6 (six) hours as needed for nausea or vomiting. (Patient not taking: Reported on 09/10/2019), Disp: 60 tablet, Rfl: 0 .  pantoprazole (PROTONIX) 40 MG tablet, Take 1 tablet (40 mg total) by mouth daily., Disp: 30 tablet, Rfl: 1  Facility-Administered Medications Ordered in Other Visits:  .  0.9 %  sodium chloride infusion, , Intravenous, Once PRN, Sindy Guadeloupe, MD, Stopped at 09/04/19 1425 .  0.9 %  sodium chloride infusion, , Intravenous, Continuous, Ivor Costa, MD .  HYDROmorphone (DILAUDID) injection 0.5 mg, 0.5 mg, Intravenous, Q2H PRN, Merlyn Lot, MD .  morphine 2 MG/ML injection 2 mg, 2 mg, Intravenous, Q4H PRN, Ivor Costa, MD .  ondansetron [facility]) injection 4 mg, 4 mg, Intravenous, Q8H PRN, Ivor Costa, MD .  piperacillin-tazobactam (ZOSYN) IVPB 3.375 g, 3.375 g, Intravenous, Once, Merlyn Lot, MD .  piperacillin-tazobactam (ZOSYN) IVPB 3.375 g, 3.375 g, Intravenous, Q8H, Niu, Xilin, MD .  sodium chloride 0.9 % bolus 500 mL, 500 mL, Intravenous, Once, Merlyn Lot, MD  Physical exam:  Vitals:   09/30/19 1600  BP: 122/87  Pulse: (!) 101  Resp: 16  Temp: 98.6 F (37 C)  TempSrc: Tympanic   Physical Exam Constitutional:      Comments: Appears in acute distress from abdominal pain  Cardiovascular:     Rate and Rhythm: Normal rate and  regular rhythm.     Heart sounds: Normal heart sounds.  Pulmonary:     Effort: Pulmonary effort is normal.     Breath sounds: Normal breath sounds.  Abdominal:     General: Bowel sounds are normal.     Palpations: Abdomen is soft.  Musculoskeletal:     Cervical back: Normal range of motion.  Skin:    General: Skin is warm and dry.  Neurological:     Mental Status: She is alert and oriented to person, place, and time.      CMP Latest Ref Rng & Units 10/01/2019  Glucose 70 - 99 mg/dL 126(H)  BUN 6 - 20 mg/dL 11  Creatinine 0.44 - 1.00 mg/dL 0.56  Sodium 135 - 145  mmol/L 137  Potassium 3.5 - 5.1 mmol/L 3.9  Chloride 98 - 111 mmol/L 101  CO2 22 - 32 mmol/L 29  Calcium 8.9 - 10.3 mg/dL 9.0  Total Protein 6.5 - 8.1 g/dL 7.4  Total Bilirubin 0.3 - 1.2 mg/dL 0.7  Alkaline Phos 38 - 126 U/L 167(H)  AST 15 - 41 U/L 48(H)  ALT 0 - 44 U/L 69(H)   CBC Latest Ref Rng & Units 10/01/2019  WBC 4.0 - 10.5 K/uL 5.7  Hemoglobin 12.0 - 15.0 g/dL 11.3(L)  Hematocrit 36 - 46 % 35.4(L)  Platelets 150 - 400 K/uL 416(H)    No images are attached to the encounter.  DG Sacrum/Coccyx  Result Date: 09/01/2019 CLINICAL DATA:  Metastatic disease to the sacrum and pelvic bones. EXAM: DG C-ARM 1-60 MIN; SACRUM AND COCCYX - 2+ VIEW FLUOROSCOPY TIME:  Fluoroscopy Time:  3 minutes 45 seconds COMPARISON:  PET-CT dated 08/27/2019 FINDINGS: AP and lateral C-arm images demonstrate contrast in the right sacral ala and adjacent right iliac bone which correlates with metastatic lesions visible on PET-CT. IMPRESSION: Cement augmentation of the right ilium and right sacral ala after radiofrequency ablation of metastatic disease. C-arm fluoroscopic images were obtained intraoperatively and submitted for post operative interpretation. Electronically Signed   By: Lorriane Shire M.D.   On: 09/01/2019 16:02   CT ABDOMEN PELVIS W CONTRAST  Result Date: 10/01/2019 CLINICAL DATA:  History of metastatic breast cancer.  Abdominal pain for 4 days. EXAM: CT ABDOMEN AND PELVIS WITH CONTRAST TECHNIQUE: Multidetector CT imaging of the abdomen and pelvis was performed using the standard protocol following bolus administration of intravenous contrast. CONTRAST:  43m OMNIPAQUE IOHEXOL 300 MG/ML  SOLN COMPARISON:  None. FINDINGS: Lower chest: Streaky basilar atelectasis but no infiltrates or effusions. The heart is normal in size. No pericardial effusion. No worrisome pulmonary nodules. Hepatobiliary: No worrisome hepatic lesions to suggest metastatic disease. The gallbladder is normal. No common bile duct dilatation. Pancreas: No mass, inflammation or ductal dilatation. Spleen: Normal size. No focal lesions. Adrenals/Urinary Tract: The adrenal glands and kidneys are unremarkable. No renal lesions or hydronephrosis. The bladder is unremarkable. Stomach/Bowel: The stomach is unremarkable. The duodenum and small bowel are unremarkable without oral contrast. No acute inflammatory changes, mass lesions or obstructive findings. Moderate inflammatory or infectious process involving the cecum and part of the ascending colon. There may be some mild associated secondary inflammation of the terminal ileum. The appendix is also secondarily inflamed with mild mucosal enhancement. There is also a small appendicoliths but I do not see any findings suspicious for acute appendicitis. The remainder of the colon is unremarkable. Scattered diverticulosis is noted. Vascular/Lymphatic: The aorta and branch vessels are patent. The major venous structures are patent. No mesenteric or retroperitoneal mass or lymphadenopathy. Reproductive: The uterus and ovaries are unremarkable. Other: No significant free abdominal or free pelvic fluid collections. No abdominal wall hernia or subcutaneous lesions. Musculoskeletal: Findings of diffuse osseous metastatic disease. Vertebral augmentation changes are noted at T6, L1 and L4. Right-sided sacral plasty also noted along  with bone cement in the right iliac bone. Right hip hardware is noted. Lytic lesion noted in the L2 vertebral body. No spinal canal encroachment. There is a large amount of bone cement noted in the spinal canal at L4. IMPRESSION: 1. Moderate inflammatory or infectious process involving the cecum and part of the ascending colon with mild associated secondary inflammation of the terminal ileum. 2. The appendix is also secondarily inflamed with mild mucosal enhancement. There  is also a small appendicoliths but no findings suspicious for acute appendicitis. 3. Findings of diffuse osseous metastatic disease as detailed above. Electronically Signed   By: Marijo Sanes M.D.   On: 10/01/2019 08:19   DG Chest Portable 1 View  Result Date: 10/01/2019 CLINICAL DATA:  Port-A-Cath placement. EXAM: PORTABLE CHEST 1 VIEW COMPARISON:  None. FINDINGS: Right IJ power port is in good position with its tip in the distal SVC near the cavoatrial junction. No complicating features are identified. The cardiac silhouette, mediastinal and hilar contours are within normal limits given the AP projection and portable technique. Low lung volumes with vascular crowding and streaky basilar atelectasis but no infiltrates or effusions. No pneumothorax. The bony thorax is intact. Vertebral augmentation changes are noted. IMPRESSION: Right IJ power port in good position without complicating features. Electronically Signed   By: Marijo Sanes M.D.   On: 10/01/2019 07:12   DG C-Arm 1-60 Min  Result Date: 09/01/2019 CLINICAL DATA:  Intramedullary nail insertion right femur secondary to metastatic disease and pinning fracture. EXAM: DG C-ARM 1-60 MIN; RIGHT FEMUR 2 VIEWS FLUOROSCOPY TIME:  Fluoroscopy Time:  3 minutes 45 seconds COMPARISON:  CT dated 08/27/2019 FINDINGS: C-arm images demonstrate insertion of intramedullary nail and proximal and distal screws in the right femur. The hardware appears in good position. Fracture. IMPRESSION:  Satisfactory appearance of the right femur after intramedullary nail insertion. C-arm fluoroscopic images were obtained intraoperatively and submitted for post operative interpretation. Electronically Signed   By: Lorriane Shire M.D.   On: 09/01/2019 16:05   DG C-Arm 1-60 Min  Result Date: 09/01/2019 CLINICAL DATA:  Metastatic disease to the sacrum and pelvic bones. EXAM: DG C-ARM 1-60 MIN; SACRUM AND COCCYX - 2+ VIEW FLUOROSCOPY TIME:  Fluoroscopy Time:  3 minutes 45 seconds COMPARISON:  PET-CT dated 08/27/2019 FINDINGS: AP and lateral C-arm images demonstrate contrast in the right sacral ala and adjacent right iliac bone which correlates with metastatic lesions visible on PET-CT. IMPRESSION: Cement augmentation of the right ilium and right sacral ala after radiofrequency ablation of metastatic disease. C-arm fluoroscopic images were obtained intraoperatively and submitted for post operative interpretation. Electronically Signed   By: Lorriane Shire M.D.   On: 09/01/2019 16:02   DG FEMUR, MIN 2 VIEWS RIGHT  Result Date: 09/01/2019 CLINICAL DATA:  Intramedullary nail insertion right femur secondary to metastatic disease and pinning fracture. EXAM: DG C-ARM 1-60 MIN; RIGHT FEMUR 2 VIEWS FLUOROSCOPY TIME:  Fluoroscopy Time:  3 minutes 45 seconds COMPARISON:  CT dated 08/27/2019 FINDINGS: C-arm images demonstrate insertion of intramedullary nail and proximal and distal screws in the right femur. The hardware appears in good position. Fracture. IMPRESSION: Satisfactory appearance of the right femur after intramedullary nail insertion. C-arm fluoroscopic images were obtained intraoperatively and submitted for post operative interpretation. Electronically Signed   By: Lorriane Shire M.D.   On: 09/01/2019 16:05     Assessment and plan- Patient is a 39 y.o. female with metastatic ER negative her 2 positive breast cancer on taxol/herceptin and perjeta here for acute visit for abdominal pain  Abdominal pain-  pain is epigastric and worse with eating. No TTP in LLQ or RLQ. No blood in stool. Patient is hemodynamically stable. Last diarrhea was 2 days back. But she has had diarrhea as she is on herceptin/perjeta.  I am unable to get CT scan today and I will get it tomorrow. I have started her on protonix in the meanwhile. Further management based on what CT scan shows.  Visit Diagnosis 1. Metastatic breast cancer ([place])   2. Stomach pain      Dr. Randa Evens, MD, MPH [facility] at [facility] [phone] 10/01/2019 9:16 AM

## 2019-10-01 NOTE — ED Notes (Signed)
Pt ambulatory to toilet with this rn assistance. Pt st using walker at home.

## 2019-10-01 NOTE — ED Provider Notes (Signed)
Patient received in signout from Dr. Nickolas Madrid pending CT imaging.  CT imaging does show evidence of colitis and ileitis.  No signs of acute appendicitis.  Her pain is primarily right-sided.  No peritonitis.  Having trouble keeping food down.  Will treat for infectious colitis will send stool cultures.  Given her persistent pain will discuss with hospitalist for admission.   Sophia Lot, MD 10/01/19 [phone]

## 2019-10-01 NOTE — ED Triage Notes (Addendum)
Pt arrives to ED via POV from home with c/o abdominal pain since Saturday. Pt reports seeing her PCP and has a procedure scheduled for tomorrow, but couldn't wait d/t pain. Pt reports epigastric area pain without radiation. Pt denies N/V, but (+) diarrhea. Pt is A&O, in NAD; RR even, regular, and unlabored.

## 2019-10-01 NOTE — Progress Notes (Signed)
Pharmacy Antibiotic Note  Sophia [name] is a 39 y.o. female admitted on 10/01/2019 with Infectious Colitis.  Pharmacy has been consulted for Zosyn dosing.  Plan: Zosyn 3.375g IV q8h (4 hour infusion).  Height: 5' (152.4 cm) Weight: 61.1 kg (134 lb 11.2 oz) IBW/kg (Calculated) : 45.5  Temp (24hrs), Avg:98.4 F (36.9 C), Min:98.2 F (36.8 C), Max:98.6 F (37 C)  Recent Labs  Lab 09/26/19 0851 10/01/19 0030  WBC 4.2 5.7  CREATININE 0.46 0.56    Estimated Creatinine Clearance: 76.3 mL/min (by C-G formula based on SCr of 0.56 mg/dL).    No Known Allergies  Antimicrobials this admission: Zosyn 7/14 >>     Dose adjustments this admission:  Microbiology results:  BCx:   UCx:    Sputum:    MRSA PCR:   Thank you for allowing pharmacy to be a part of this patient's care.  Vira Blanco 10/01/2019 9:30 AM

## 2019-10-01 NOTE — ED Provider Notes (Signed)
[facility] Emergency Department Provider Note  ____________________________________________   First MD Initiated Contact with Patient 10/01/19 [phone]     (approximate)  I have reviewed the triage vital signs and the nursing notes.   HISTORY  Chief Complaint Abdominal Pain    HPI Sophia [name] is a 39 y.o. female with metastatic breast cancer who comes in for abdominal pain.  Patient is currently on radiation.  Followed by Dr. Janese Banks.  Patient reports having upper abdominal pain for the past 4 days but progressively getting worse, now more severe, constant, nothing makes it better, worse after eating.  States that she is had 4 episodes of diarrhea over the past 4 days everything that she eats just runs out.  Denies any nausea or vomiting.  Denies any chest pain, shortness of breath.  The pain is epigastric in nature little bit on either upper abdomen.  No lower abdominal pain.          Past Medical History:  Diagnosis Date  . Anxiety   . Cancer ([place])   . Vertigo     Patient Active Problem List   Diagnosis Date Noted  . Bone metastases ([place]) 09/26/2019  . Encounter for monoclonal antibody treatment for malignancy   . Goals of care, counseling/discussion   . Neoplasm related pain   . Metastatic breast cancer ([place]) 08/29/2019  . Primary cancer of left breast with metastasis to other site ([place]) 08/28/2019  . Multiple pathological fractures 08/28/2019  . Anxiety 08/28/2019  . Preoperative clearance 08/28/2019  . Leukocytosis 08/28/2019    Past Surgical History:  Procedure Laterality Date  . BREAST BIOPSY Left 08/14/2019   [place] bx of mass, coil marker, path pending  . BREAST BIOPSY Left 08/14/2019   [place] bx of LN, hydromarker, path pending  . BREAST BIOPSY Left 08/14/2019   affirm bx of calcs, x marker, path pending  . INTRAMEDULLARY (IM) NAIL INTERTROCHANTERIC Right 09/01/2019   Procedure: INTRAMEDULLARY (IM) NAIL INTERTROCHANTRIC AND  RADIOFREQUENCY ABLATION;  Surgeon: Hessie Knows, MD;  Location: [facility] ORS;  Service: Orthopedics;  Laterality: Right;  . KYPHOPLASTY N/A 08/29/2019   Procedure: KYPHOPLASTY T6, L1,L4 ,  RADIOFREQUENCY ABLATION;  Surgeon: Hessie Knows, MD;  Location: [facility] ORS;  Service: Orthopedics;  Laterality: N/A;  . KYPHOPLASTY Right 09/01/2019   Procedure: Right Sacral Radiofrequency Ablation and Cement Augmentation, Right sacrum and iliac crest;  Surgeon: Hessie Knows, MD;  Location: [facility] ORS;  Service: Orthopedics;  Laterality: Right;  . PORTA CATH INSERTION N/A 08/28/2019   Procedure: PORTA CATH INSERTION;  Surgeon: Algernon Huxley, MD;  Location: [facility];  Service: Cardiovascular;  Laterality: N/A;    Prior to Admission medications   Medication Sig Start Date End Date Taking? Authorizing Provider  enoxaparin (LOVENOX) 40 MG/0.4ML injection Inject 0.4 mLs (40 mg total) into the skin daily for 14 days. 09/05/19 09/19/19  Duanne Guess, PA-C  gabapentin (NEURONTIN) 300 MG capsule Take 1 capsule (300 mg total) by mouth 2 (two) times daily. 09/26/19   Sindy Guadeloupe, MD  gabapentin (NEURONTIN) 300 MG capsule Take 1 capsule (300 mg total) by mouth 2 (two) times daily. 09/26/19   Sindy Guadeloupe, MD  hydrOXYzine (ATARAX/VISTARIL) 10 MG tablet Take 1 tablet (10 mg total) by mouth 3 (three) times daily as needed for anxiety. 09/04/19   Guilford Shi, MD  lidocaine-prilocaine (EMLA) cream Apply 1 application topically as needed. Apply small amount to port site at least 1 hour prior to  it being accessed, cover with plastic wrap 09/10/19   Sindy Guadeloupe, MD  LORazepam (ATIVAN) 0.5 MG tablet Take 1 tablet (0.5 mg total) by mouth every 6 (six) hours as needed for anxiety. 09/26/19   Sindy Guadeloupe, MD  LORazepam (ATIVAN) 0.5 MG tablet Take 1 tablet (0.5 mg total) by mouth every 6 (six) hours as needed for anxiety. 09/26/19   Sindy Guadeloupe, MD  ondansetron (ZOFRAN) 4 MG tablet Take 1 tablet (4 mg total) by mouth every  6 (six) hours as needed for nausea or vomiting. Patient not taking: Reported on 09/10/2019 09/04/19   Sindy Guadeloupe, MD  oxyCODONE (OXY IR/ROXICODONE) 5 MG immediate release tablet Take 1-2 tablets (5-10 mg total) by mouth every 4 (four) hours as needed for moderate pain. 09/11/19   Sindy Guadeloupe, MD  oxyCODONE (OXYCONTIN) 10 mg 12 hr tablet Take 1 tablet (10 mg total) by mouth every 12 (twelve) hours. 09/11/19   Sindy Guadeloupe, MD  pantoprazole (PROTONIX) 40 MG tablet Take 1 tablet (40 mg total) by mouth daily. 09/30/19   Sindy Guadeloupe, MD  senna-docusate (SENOKOT-S) 8.6-50 MG tablet Take 1 tablet by mouth 2 (two) times daily. 09/04/19   Guilford Shi, MD    Allergies Patient has no known allergies.  Family History  Problem Relation Age of Onset  . Colon cancer Maternal Uncle     Social History Social History   Tobacco Use  . Smoking status: Never Smoker  . Smokeless tobacco: Never Used  Vaping Use  . Vaping Use: Never used  Substance Use Topics  . Alcohol use: Yes    Comment: occassionaly  . Drug use: Not Currently      Review of Systems Constitutional: No fever/chills Eyes: No visual changes. ENT: No sore throat. Cardiovascular: Denies chest pain. Respiratory: Denies shortness of breath. Gastrointestinal: Abdominal pain, decreased p.o. intake, diarrhea Genitourinary: Negative for dysuria. Musculoskeletal: Negative for back pain. Skin: Negative for rash. Neurological: Negative for headaches, focal weakness or numbness. All other ROS negative ____________________________________________   PHYSICAL EXAM:  VITAL SIGNS: ED Triage Vitals [10/01/19 0028]  Enc Vitals Group     BP (!) 109/97     Pulse Rate (!) 105     Resp 15     Temp 98.2 F (36.8 C)     Temp Source Oral     SpO2 98 %     Weight 134 lb 11.2 oz (61.1 kg)     Height 5' (1.524 m)     Head Circumference      Peak Flow      Pain Score 10     Pain Loc      Pain Edu?      Excl. in [place]?      Constitutional: Alert and oriented. Well appearing and in no acute distress. Eyes: Conjunctivae are normal. EOMI. Head: Atraumatic. Nose: No congestion/rhinnorhea. Mouth/Throat: Mucous membranes are moist.   Neck: No stridor. Trachea Midline. FROM Cardiovascular: Normal rate, regular rhythm. Grossly normal heart sounds.  Good peripheral circulation. Respiratory: Normal respiratory effort.  No retractions. Lungs CTAB. Gastrointestinal: Tender in the middle of the abdomen.  No distention. No abdominal bruits.  Musculoskeletal: No lower extremity tenderness nor edema.  No joint effusions. Neurologic:  Normal speech and language. No gross focal neurologic deficits are appreciated.  Skin:  Skin is warm, dry and intact. No rash noted. Psychiatric: Mood and affect are normal. Speech and behavior are normal. GU: Deferred   ____________________________________________  LABS (all labs ordered are listed, but only abnormal results are displayed)  Labs Reviewed  COMPREHENSIVE METABOLIC PANEL - Abnormal; Notable for the following components:      Result Value   Glucose, Bld 126 (*)    AST 48 (*)    ALT 69 (*)    Alkaline Phosphatase 167 (*)    All other components within normal limits  CBC - Abnormal; Notable for the following components:   Hemoglobin 11.3 (*)    HCT 35.4 (*)    Platelets 416 (*)    All other components within normal limits  URINALYSIS, COMPLETE (UACMP) WITH MICROSCOPIC - Abnormal; Notable for the following components:   Color, Urine YELLOW (*)    APPearance HAZY (*)    Ketones, ur 5 (*)    Leukocytes,Ua MODERATE (*)    WBC, UA >50 (*)    Bacteria, UA RARE (*)    All other components within normal limits  LIPASE, BLOOD  POC URINE PREG, ED  POCT PREGNANCY, URINE   ____________________________________________   ED ECG REPORT I, Vanessa [place], the attending physician, personally viewed and interpreted this ECG.  EKG is normal sinus rate of 94, no ST  elevation, T wave inversion in lead III, normal intervals.  She is had similar T wave inversion in prior EKGs ____________________________________________  RADIOLOGY I, Vanessa [place], personally viewed and evaluated these images (plain radiographs) as part of my medical decision making, as well as reviewing the written report by the radiologist.  ED MD interpretation:  Pending   Official radiology report(s): No results found.  ____________________________________________   PROCEDURES  Procedure(s) performed (including Critical Care):  Procedures   ____________________________________________   INITIAL IMPRESSION / ASSESSMENT AND PLAN / ED COURSE  Sophia [name] was evaluated in Emergency Department on 10/01/2019 for the symptoms described in the history of present illness. She was evaluated in the context of the global COVID-19 pandemic, which necessitated consideration that the patient might be at risk for infection with the SARS-CoV-2 virus that causes COVID-19. Institutional protocols and algorithms that pertain to the evaluation of patients at risk for COVID-19 are in a state of rapid change based on information released by regulatory bodies including the CDC and federal and state organizations. These policies and algorithms were followed during the patient's care in the ED.    Patient is a 39 year old who comes in with epigastric abdominal pain.  Given patient's history of cancer will get CT scan to evaluate for metastatic disease, perforation, obstruction, gallbladder pathology.  Will get labs to evaluate for Electra abnormalities, AKI.  We will give patient dose of IV Dilaudid, Zofran, fluids and reevaluate patient.  Do not think she has C. difficile or GI bug given she only had 4 episodes of loose stool over the past 4 days does not sound to be like true diarrhea.  Pregnancy test was negative UA with greater than 50 WBCs and moderate leuks she denies symptoms of UTI but  will add on culture. No white count elevation to suggest severe infection or platelets are downtrending from 5 days ago but slightly elevated at 416 Hemoglobin is stable LFTs are slightly elevated but they have been previously and she is a normal total bilirubin Lipase is normal  Patient had off to oncoming team pending CT scan and repeat evaluation     ____________________________________________   FINAL CLINICAL IMPRESSION(S) / ED DIAGNOSES   Final diagnoses:  None      MEDICATIONS GIVEN DURING THIS  VISIT:  Medications  sodium chloride 0.9 % bolus 1,000 mL (has no administration in time range)  ondansetron (ZOFRAN) injection 4 mg (has no administration in time range)  HYDROmorphone (DILAUDID) injection 0.5 mg (has no administration in time range)  pantoprazole (PROTONIX) injection 40 mg (has no administration in time range)     ED Discharge Orders    None       Note:  This document was prepared using Dragon voice recognition software and may include unintentional dictation errors.   Vanessa , MD 10/01/19 [phone]

## 2019-10-01 NOTE — Progress Notes (Signed)
Patient called triage with complaints of severe abdominal pain.  Rates pain a 9/10.  Worse when eating.  No relief with her pain meds.  Had diarrhea over the weekend, but it has resolved.  Message sent to Dr. Janese Banks and team.  She will see patient today.

## 2019-10-01 NOTE — ED Notes (Signed)
Patient up to bedside commode with bowel movement and voiding.  Unable to send sample of stool because stool is too formed.

## 2019-10-01 NOTE — H&P (Signed)
History and Physical    Sophia [name] SEG:[id number] DOB: [birth year] DOA: 10/01/2019  Referring MD/NP/PA:   PCP: [facility], [place], NP   Patient coming from:  The patient is coming from home.  At baseline, pt is independent for most of ADL.        Chief Complaint: Abdominal pain and diarrhea  HPI: Sophia [name] is a 39 y.o. female with medical history significant of metastasized to breast cancer (monoclonal antibody treatment), GERD, anxiety, vertigo, who presents with abdominal pain, diarrhea.  Patient has been having abdominal pain and diarrhea for 5 days.  No nausea or vomiting.  Denies fever or chills.  Patient has had 4 times of watery diarrhea today.  Her abdominal pain is located in central abdomen, moderate, aching, nonradiating.  Denies chest pain, shortness breath, cough.  Denies symptoms of UTI.  No unilateral weakness.  ED Course: pt was found to have WBC 5.7, lipase of 20, negative pregnancy test, negative COVID-19 PCR, negative C. difficile, negative GI pathogen panel, urinalysis (hazy appearance, moderate amount of leukocyte, rare bacteria, WBC> 50), electrolyte renal function okay, liver function (ALP 167, ALT 69, AST 48, total bilirubin 0.7), temperature normal, blood pressure 109/97, tachycardia, RR 16, oxygen saturation 98% on room air.  Patient is placed on MedSurg bed of observation  CT abdomen/pelvis that showed:  1. Moderate inflammatory or infectious process involving the cecum and part of the ascending colon with mild associated secondary inflammation of the terminal ileum. 2. The appendix is also secondarily inflamed with mild mucosal enhancement. There is also a small appendicoliths but no findings suspicious for acute appendicitis. 3. Findings of diffuse osseous metastatic disease as detailed above.   Review of Systems:   General: no fevers, chills, no body weight gain, has poor appetite, has fatigue HEENT: no blurry vision,  hearing changes or sore throat Respiratory: no dyspnea, coughing, wheezing CV: no chest pain, no palpitations GI: has nausea, vomiting, abdominal pain, diarrhea, no constipation GU: no dysuria, burning on urination, increased urinary frequency, hematuria  Ext: no leg edema Neuro: no unilateral weakness, numbness, or tingling, no vision change or hearing loss Skin: no rash, no skin tear. MSK: No muscle spasm, no deformity, no limitation of range of movement in spin Heme: No easy bruising.  Travel history: No recent long distant travel.  Allergy: No Known Allergies  Past Medical History:  Diagnosis Date  . Anxiety   . Cancer ([place])   . Vertigo     Past Surgical History:  Procedure Laterality Date  . BREAST BIOPSY Left 08/14/2019   [place] bx of mass, coil marker, path pending  . BREAST BIOPSY Left 08/14/2019   [place] bx of LN, hydromarker, path pending  . BREAST BIOPSY Left 08/14/2019   affirm bx of calcs, x marker, path pending  . INTRAMEDULLARY (IM) NAIL INTERTROCHANTERIC Right 09/01/2019   Procedure: INTRAMEDULLARY (IM) NAIL INTERTROCHANTRIC AND RADIOFREQUENCY ABLATION;  Surgeon: Hessie Knows, MD;  Location: [facility];  Service: Orthopedics;  Laterality: Right;  . KYPHOPLASTY N/A 08/29/2019   Procedure: KYPHOPLASTY T6, L1,L4 ,  RADIOFREQUENCY ABLATION;  Surgeon: Hessie Knows, MD;  Location: [facility];  Service: Orthopedics;  Laterality: N/A;  . KYPHOPLASTY Right 09/01/2019   Procedure: Right Sacral Radiofrequency Ablation and Cement Augmentation, Right sacrum and iliac crest;  Surgeon: Hessie Knows, MD;  Location: [facility];  Service: Orthopedics;  Laterality: Right;  . PORTA CATH INSERTION N/A 08/28/2019   Procedure: PORTA CATH INSERTION;  Surgeon: Algernon Huxley, MD;  Location: [facility];  Service: Cardiovascular;  Laterality: N/A;    Social History:  reports that she has never smoked. She has never used smokeless tobacco. She reports current alcohol use. She reports previous  drug use.  Family History:  Family History  Problem Relation Age of Onset  . Colon cancer Maternal Uncle      Prior to Admission medications   Medication Sig Start Date End Date Taking? Authorizing Provider  enoxaparin (LOVENOX) 40 MG/0.4ML injection Inject 0.4 mLs (40 mg total) into the skin daily for 14 days. 09/05/19 09/19/19  Duanne Guess, PA-C  gabapentin (NEURONTIN) 300 MG capsule Take 1 capsule (300 mg total) by mouth 2 (two) times daily. 09/26/19   Sindy Guadeloupe, MD  gabapentin (NEURONTIN) 300 MG capsule Take 1 capsule (300 mg total) by mouth 2 (two) times daily. 09/26/19   Sindy Guadeloupe, MD  hydrOXYzine (ATARAX/VISTARIL) 10 MG tablet Take 1 tablet (10 mg total) by mouth 3 (three) times daily as needed for anxiety. 09/04/19   Guilford Shi, MD  lidocaine-prilocaine (EMLA) cream Apply 1 application topically as needed. Apply small amount to port site at least 1 hour prior to it being accessed, cover with plastic wrap 09/10/19   Sindy Guadeloupe, MD  LORazepam (ATIVAN) 0.5 MG tablet Take 1 tablet (0.5 mg total) by mouth every 6 (six) hours as needed for anxiety. 09/26/19   Sindy Guadeloupe, MD  LORazepam (ATIVAN) 0.5 MG tablet Take 1 tablet (0.5 mg total) by mouth every 6 (six) hours as needed for anxiety. 09/26/19   Sindy Guadeloupe, MD  ondansetron (ZOFRAN) 4 MG tablet Take 1 tablet (4 mg total) by mouth every 6 (six) hours as needed for nausea or vomiting. Patient not taking: Reported on 09/10/2019 09/04/19   Sindy Guadeloupe, MD  oxyCODONE (OXY IR/ROXICODONE) 5 MG immediate release tablet Take 1-2 tablets (5-10 mg total) by mouth every 4 (four) hours as needed for moderate pain. 09/11/19   Sindy Guadeloupe, MD  oxyCODONE (OXYCONTIN) 10 mg 12 hr tablet Take 1 tablet (10 mg total) by mouth every 12 (twelve) hours. 09/11/19   Sindy Guadeloupe, MD  pantoprazole (PROTONIX) 40 MG tablet Take 1 tablet (40 mg total) by mouth daily. 09/30/19   Sindy Guadeloupe, MD  senna-docusate (SENOKOT-S) 8.6-50 MG tablet Take 1  tablet by mouth 2 (two) times daily. 09/04/19   Guilford Shi, MD    Physical Exam: Vitals:   10/01/19 1334 10/01/19 1500 10/01/19 1630 10/01/19 1703  BP: 116/73 109/69 106/73 109/75  Pulse: 98 87 83 86  Resp: 12 16 13 16   Temp:    98.2 F (36.8 C)  TempSrc:    Oral  SpO2: 100% 99% 100% 100%  Weight:      Height:       General: Not in acute distress HEENT:       Eyes: PERRL, EOMI, no scleral icterus.       ENT: No discharge from the ears and nose, no pharynx injection, no tonsillar enlargement.        Neck: No JVD, no bruit, no mass felt. Heme: No neck lymph node enlargement. Cardiac: S1/S2, RRR, No murmurs, No gallops or rubs. Respiratory: No rales, wheezing, rhonchi or rubs. GI: Soft, nondistended, has mild tenderness in central abdomen, no rebound pain, no organomegaly, BS present. GU: No hematuria Ext: No pitting leg edema bilaterally. 2+DP/PT pulse bilaterally. Musculoskeletal: No joint deformities, No joint redness or warmth, no limitation  of ROM in spin. Skin: No rashes.  Neuro: Alert, oriented X3, cranial nerves II-XII grossly intact, moves all extremities normally.  Psych: Patient is not psychotic, no suicidal or hemocidal ideation.  Labs on Admission: I have personally reviewed following labs and imaging studies  CBC: Recent Labs  Lab 09/26/19 0851 10/01/19 0030  WBC 4.2 5.7  NEUTROABS 2.1  --   HGB 10.5* 11.3*  HCT 31.5* 35.4*  MCV 88.7 90.8  PLT 527* 856*   Basic Metabolic Panel: Recent Labs  Lab 09/26/19 0851 10/01/19 0030  NA 138 137  K 3.5 3.9  CL 104 101  CO2 25 29  GLUCOSE 104* 126*  BUN 7 11  CREATININE 0.46 0.56  CALCIUM 8.8* 9.0   GFR: Estimated Creatinine Clearance: 76.3 mL/min (by C-G formula based on SCr of 0.56 mg/dL). Liver Function Tests: Recent Labs  Lab 09/26/19 0851 10/01/19 0030  AST 31 48*  ALT 45* 69*  ALKPHOS 216* 167*  BILITOT 0.5 0.7  PROT 6.9 7.4  ALBUMIN 3.2* 3.7   Recent Labs  Lab 10/01/19 0030    LIPASE 40   No results for input(s): AMMONIA in the last 168 hours. Coagulation Profile: No results for input(s): INR, PROTIME in the last 168 hours. Cardiac Enzymes: No results for input(s): CKTOTAL, CKMB, CKMBINDEX, TROPONINI in the last 168 hours. BNP (last 3 results) No results for input(s): PROBNP in the last 8760 hours. HbA1C: No results for input(s): HGBA1C in the last 72 hours. CBG: No results for input(s): GLUCAP in the last 168 hours. Lipid Profile: No results for input(s): CHOL, HDL, LDLCALC, TRIG, CHOLHDL, LDLDIRECT in the last 72 hours. Thyroid Function Tests: No results for input(s): TSH, T4TOTAL, FREET4, T3FREE, THYROIDAB in the last 72 hours. Anemia Panel: No results for input(s): VITAMINB12, FOLATE, FERRITIN, TIBC, IRON, RETICCTPCT in the last 72 hours. Urine analysis:    Component Value Date/Time   COLORURINE YELLOW (A) 10/01/2019 0030   APPEARANCEUR HAZY (A) 10/01/2019 0030   LABSPEC 1.018 10/01/2019 0030   PHURINE 6.0 10/01/2019 0030   GLUCOSEU NEGATIVE 10/01/2019 0030   HGBUR NEGATIVE 10/01/2019 0030   BILIRUBINUR NEGATIVE 10/01/2019 0030   KETONESUR 5 (A) 10/01/2019 0030   PROTEINUR NEGATIVE 10/01/2019 0030   NITRITE NEGATIVE 10/01/2019 0030   LEUKOCYTESUR MODERATE (A) 10/01/2019 0030   Sepsis Labs: @LABRCNTIP (procalcitonin:4,lacticidven:4) ) Recent Results (from the past 240 hour(s))  SARS Coronavirus 2 by RT PCR (hospital order, performed in [facility] lab) Nasopharyngeal Nasopharyngeal Swab     Status: None   Collection Time: 10/01/19  9:39 AM   Specimen: Nasopharyngeal Swab  Result Value Ref Range Status   SARS Coronavirus 2 NEGATIVE NEGATIVE Final    Comment: (NOTE) SARS-CoV-2 target nucleic acids are NOT DETECTED.  The SARS-CoV-2 RNA is generally detectable in upper and lower respiratory specimens during the acute phase of infection. The lowest concentration of SARS-CoV-2 viral copies this assay can detect is 250 copies / mL. A  negative result does not preclude SARS-CoV-2 infection and should not be used as the sole basis for treatment or other patient management decisions.  A negative result may occur with improper specimen collection / handling, submission of specimen other than nasopharyngeal swab, presence of viral mutation(s) within the areas targeted by this assay, and inadequate number of viral copies (<250 copies / mL). A negative result must be combined with clinical observations, patient history, and epidemiological information.  Fact Sheet for Patients:   StrictlyIdeas.no  Fact Sheet for Healthcare Providers:  BankingDealers.co.za  This test is not yet approved or  cleared by the [place] and has been authorized for detection and/or diagnosis of SARS-CoV-2 by FDA under an Emergency Use Authorization (EUA).  This EUA will remain in effect (meaning this test can be used) for the duration of the COVID-19 declaration under Section 564(b)(1) of the Act, 21 U.S.C. section 360bbb-3(b)(1), unless the authorization is terminated or revoked sooner.  Performed at [facility], [address] [place], [place] [address]   Gastrointestinal Panel by PCR , Stool     Status: None   Collection Time: 10/01/19  3:43 PM   Specimen: Stool  Result Value Ref Range Status   Campylobacter species NOT DETECTED NOT DETECTED Final   Plesimonas shigelloides NOT DETECTED NOT DETECTED Final   Salmonella species NOT DETECTED NOT DETECTED Final   Yersinia enterocolitica NOT DETECTED NOT DETECTED Final   Vibrio species NOT DETECTED NOT DETECTED Final   Vibrio cholerae NOT DETECTED NOT DETECTED Final   Enteroaggregative E coli (EAEC) NOT DETECTED NOT DETECTED Final   Enteropathogenic E coli (EPEC) NOT DETECTED NOT DETECTED Final   Enterotoxigenic E coli (ETEC) NOT DETECTED NOT DETECTED Final   Shiga like toxin producing E coli (STEC) NOT DETECTED NOT DETECTED  Final   Shigella/Enteroinvasive E coli (EIEC) NOT DETECTED NOT DETECTED Final   Cryptosporidium NOT DETECTED NOT DETECTED Final   Cyclospora cayetanensis NOT DETECTED NOT DETECTED Final   Entamoeba histolytica NOT DETECTED NOT DETECTED Final   Giardia lamblia NOT DETECTED NOT DETECTED Final   Adenovirus F40/41 NOT DETECTED NOT DETECTED Final   Astrovirus NOT DETECTED NOT DETECTED Final   Norovirus GI/GII NOT DETECTED NOT DETECTED Final   Rotavirus A NOT DETECTED NOT DETECTED Final   Sapovirus (I, II, IV, and V) NOT DETECTED NOT DETECTED Final    Comment: Performed at [facility], [address] [place], [place] [address]  C Difficile Quick Screen w PCR reflex     Status: None   Collection Time: 10/01/19  3:43 PM   Specimen: Stool  Result Value Ref Range Status   C Diff antigen NEGATIVE NEGATIVE Final   C Diff toxin NEGATIVE NEGATIVE Final   C Diff interpretation No C. difficile detected.  Final    Comment: Performed at [facility], [address] [place], [place] [address]     Radiological Exams on Admission: CT ABDOMEN PELVIS W CONTRAST  Result Date: 10/01/2019 CLINICAL DATA:  History of metastatic breast cancer. Abdominal pain for 4 days. EXAM: CT ABDOMEN AND PELVIS WITH CONTRAST TECHNIQUE: Multidetector CT imaging of the abdomen and pelvis was performed using the standard protocol following bolus administration of intravenous contrast. CONTRAST:  31mL OMNIPAQUE IOHEXOL 300 MG/ML  SOLN COMPARISON:  None. FINDINGS: Lower chest: Streaky basilar atelectasis but no infiltrates or effusions. The heart is normal in size. No pericardial effusion. No worrisome pulmonary nodules. Hepatobiliary: No worrisome hepatic lesions to suggest metastatic disease. The gallbladder is normal. No common bile duct dilatation. Pancreas: No mass, inflammation or ductal dilatation. Spleen: Normal size. No focal lesions. Adrenals/Urinary Tract: The adrenal glands and kidneys are unremarkable.  No renal lesions or hydronephrosis. The bladder is unremarkable. Stomach/Bowel: The stomach is unremarkable. The duodenum and small bowel are unremarkable without oral contrast. No acute inflammatory changes, mass lesions or obstructive findings. Moderate inflammatory or infectious process involving the cecum and part of the ascending colon. There may be some mild associated secondary inflammation of the terminal ileum. The appendix  is also secondarily inflamed with mild mucosal enhancement. There is also a small appendicoliths but I do not see any findings suspicious for acute appendicitis. The remainder of the colon is unremarkable. Scattered diverticulosis is noted. Vascular/Lymphatic: The aorta and branch vessels are patent. The major venous structures are patent. No mesenteric or retroperitoneal mass or lymphadenopathy. Reproductive: The uterus and ovaries are unremarkable. Other: No significant free abdominal or free pelvic fluid collections. No abdominal wall hernia or subcutaneous lesions. Musculoskeletal: Findings of diffuse osseous metastatic disease. Vertebral augmentation changes are noted at T6, L1 and L4. Right-sided sacral plasty also noted along with bone cement in the right iliac bone. Right hip hardware is noted. Lytic lesion noted in the L2 vertebral body. No spinal canal encroachment. There is a large amount of bone cement noted in the spinal canal at L4. IMPRESSION: 1. Moderate inflammatory or infectious process involving the cecum and part of the ascending colon with mild associated secondary inflammation of the terminal ileum. 2. The appendix is also secondarily inflamed with mild mucosal enhancement. There is also a small appendicoliths but no findings suspicious for acute appendicitis. 3. Findings of diffuse osseous metastatic disease as detailed above. Electronically Signed   By: Marijo Sanes M.D.   On: 10/01/2019 08:19   DG Chest Portable 1 View  Result Date: 10/01/2019 CLINICAL DATA:   Port-A-Cath placement. EXAM: PORTABLE CHEST 1 VIEW COMPARISON:  None. FINDINGS: Right IJ power port is in good position with its tip in the distal SVC near the cavoatrial junction. No complicating features are identified. The cardiac silhouette, mediastinal and hilar contours are within normal limits given the AP projection and portable technique. Low lung volumes with vascular crowding and streaky basilar atelectasis but no infiltrates or effusions. No pneumothorax. The bony thorax is intact. Vertebral augmentation changes are noted. IMPRESSION: Right IJ power port in good position without complicating features. Electronically Signed   By: Marijo Sanes M.D.   On: 10/01/2019 07:12     EKG: Independently reviewed.  Sinus rhythm, QTC 427, early R wave progression, nonspecific T wave change   Assessment/Plan Principal Problem:   Acute colitis Active Problems:   Anxiety   Metastatic breast cancer ([facility])   Abdominal pain   Abnormal LFTs  Acute colitis: CT showed moderate inflammatory or infectious process involving the cecum and part of the ascending colon with mild associated secondary inflammation of the terminal ileum.  Pain is negative for C diff and GI pathogen panel.  -Placed on MedSurg bed for patient -As needed morphine for pain-Zofran for nausea -IV fluid: 1.5 cc normal saline, followed by 75 cc/h -IV Zosyn started in ED, will continue -Follow-up blood culture  Anxiety -continue home hydroxyzine and Ativan  Metastatic breast cancer [facility]): -Follow-up with oncology, Dr. Janese Banks  Abnormal LFTs -Avoid using Tylenol -Get hepatitis panel and HIV antibody   Positive UA: Patient has positive urinalysis, but she denies symptoms of UTI. -f/u urine culture -pt is on zosyn     DVT ppx: SQ Lovenox Code Status: Full code Family Communication: not done, no family member is at bed side.  Disposition Plan:  Anticipate discharge back to previous environment Consults called:   None Admission status: Med-surg bed for obs    Status is: Observation  The patient remains OBS appropriate and will d/c before 2 midnights.  Dispo: The patient is from: Home              Anticipated d/c is to: Home  Anticipated d/c date is: 1 day              Patient currently is not medically stable to d/c.            Date of Service 10/01/2019    Ivor Costa [facility]   If 7PM-7AM, please contact night-coverage [email or web address] 10/01/2019, 6:03 PM

## 2019-10-02 ENCOUNTER — Ambulatory Visit: Payer: Self-pay

## 2019-10-02 DIAGNOSIS — K529 Noninfective gastroenteritis and colitis, unspecified: Secondary | ICD-10-CM | POA: Diagnosis present

## 2019-10-02 LAB — BLOOD CULTURE ID PANEL (REFLEXED)

## 2019-10-02 LAB — VITAMIN B12: Vitamin B-12: 326 pg/mL (ref 180–914)

## 2019-10-02 LAB — CBC
HCT: 28.4 % — ABNORMAL LOW (ref 36.0–46.0)
Hemoglobin: 9.3 g/dL — ABNORMAL LOW (ref 12.0–15.0)
MCH: 29.4 pg (ref 26.0–34.0)
MCHC: 32.7 g/dL (ref 30.0–36.0)
MCV: 89.9 fL (ref 80.0–100.0)
Platelets: 337 10*3/uL (ref 150–400)
RBC: 3.16 MIL/uL — ABNORMAL LOW (ref 3.87–5.11)
RDW: 15.3 % (ref 11.5–15.5)
WBC: 4 10*3/uL (ref 4.0–10.5)
nRBC: 0 % (ref 0.0–0.2)

## 2019-10-02 LAB — BASIC METABOLIC PANEL
Anion gap: 10 (ref 5–15)
BUN: <5 mg/dL — ABNORMAL LOW (ref 6–20)
CO2: 22 mmol/L (ref 22–32)
Calcium: 8.6 mg/dL — ABNORMAL LOW (ref 8.9–10.3)
Chloride: 104 mmol/L (ref 98–111)
Creatinine, Ser: 0.45 mg/dL (ref 0.44–1.00)
GFR calc Af Amer: >60 mL/min (ref >60)
GFR calc non Af Amer: >60 mL/min (ref >60)
Glucose, Bld: 91 mg/dL (ref 70–99)
Potassium: 3.4 mmol/L — ABNORMAL LOW (ref 3.5–5.1)
Sodium: 136 mmol/L (ref 135–145)

## 2019-10-02 LAB — IRON AND TIBC
Iron: 23 ug/dL — ABNORMAL LOW (ref 28–170)
Saturation Ratios: 8 % — ABNORMAL LOW (ref 10.4–31.8)
TIBC: 286 ug/dL (ref 250–450)
UIBC: 263 ug/dL

## 2019-10-02 LAB — URINE CULTURE

## 2019-10-02 LAB — MAGNESIUM: Magnesium: 1.9 mg/dL (ref 1.7–2.4)

## 2019-10-02 MED ORDER — OXYCODONE HCL 5 MG PO TABS
10.0000 mg | ORAL_TABLET | ORAL | Status: DC | PRN
  Administered 2019-10-02 – 2019-10-04 (×5): 10 mg via ORAL
  Filled 2019-10-02 (×6): qty 2

## 2019-10-02 MED ORDER — METRONIDAZOLE 500 MG PO TABS
500.0000 mg | ORAL_TABLET | Freq: Three times a day (TID) | ORAL | Status: DC
  Administered 2019-10-02 – 2019-10-05 (×9): 500 mg via ORAL
  Filled 2019-10-02 (×9): qty 1

## 2019-10-02 MED ORDER — POTASSIUM CHLORIDE 20 MEQ PO PACK
40.0000 meq | PACK | Freq: Once | ORAL | Status: AC
  Administered 2019-10-02: 11:00:00 40 meq via ORAL
  Filled 2019-10-02: qty 2

## 2019-10-02 MED ORDER — KCL-LACTATED RINGERS 20 MEQ/L IV SOLN
INTRAVENOUS | Status: DC
  Filled 2019-10-02 (×2): qty 1000

## 2019-10-02 MED ORDER — CIPROFLOXACIN HCL 500 MG PO TABS
500.0000 mg | ORAL_TABLET | Freq: Two times a day (BID) | ORAL | Status: DC
  Administered 2019-10-02 – 2019-10-04 (×5): 500 mg via ORAL
  Filled 2019-10-02 (×5): qty 1

## 2019-10-02 MED ORDER — POTASSIUM CHLORIDE 10 MEQ/5ML IV SOSY
PREFILLED_SYRINGE | INTRAVENOUS | Status: DC
  Filled 2019-10-02 (×5): qty 1000

## 2019-10-02 MED ORDER — SUCRALFATE 1 G PO TABS
1.0000 g | ORAL_TABLET | Freq: Three times a day (TID) | ORAL | Status: DC
  Administered 2019-10-02 – 2019-10-04 (×12): 1 g via ORAL
  Filled 2019-10-02 (×11): qty 1

## 2019-10-02 MED ORDER — MORPHINE SULFATE (PF) 2 MG/ML IV SOLN
1.0000 mg | INTRAVENOUS | Status: DC | PRN
  Administered 2019-10-02 – 2019-10-04 (×4): 1 mg via INTRAVENOUS
  Filled 2019-10-02 (×4): qty 1

## 2019-10-02 MED ORDER — ADULT MULTIVITAMIN W/MINERALS CH
1.0000 | ORAL_TABLET | Freq: Every day | ORAL | Status: DC
  Administered 2019-10-03 – 2019-10-04 (×2): 1 via ORAL
  Filled 2019-10-02 (×2): qty 1

## 2019-10-02 MED ORDER — ENSURE ENLIVE PO LIQD
237.0000 mL | Freq: Two times a day (BID) | ORAL | Status: DC
  Administered 2019-10-02 – 2019-10-04 (×5): 237 mL via ORAL

## 2019-10-02 NOTE — Progress Notes (Signed)
PROGRESS NOTE    Sophia [name]  XYI:[id number] DOB: 12/24/1980 DOA: 10/01/2019 PCP: [facility] [place], NP    Chief Complaint: Diarrhea and abdominal pain.  Brief Narrative:  Sophia [name] is a 39 y.o. female with medical history significant of metastasized to breast cancer (monoclonal antibody treatment), GERD, anxiety, vertigo, who presents with abdominal pain, diarrhea. Patient had a radiation therapy on her right hip last Wednesday, chemotherapy was on last Friday.  She started have abdominal pain and nausea vomiting diarrhea on Sunday. CT scan showed evidence of multiple colitis.  C. difficile toxin so far has been negative.  She is started on zosyn.  She was also given symptomatic treatment and IV fluids.  7/15.  Still has some diarrhea and upper abdominal pain.  No nausea vomiting, tolerating diet.  I will continue some IV fluids.  Blood culture was positive for coag negative staph, appears to be contaminant.  Assessment & Plan:   Principal Problem:   Acute colitis Active Problems:   Anxiety   Metastatic breast cancer (HCC)   Abnormal LFTs   Colitis  #1.  Acute colitis. C. difficile toxin was negative.  I will continue antibiotics, but change to Cipro and Flagyl orally.  Patient also has some gastric pain, continue Protonix.  Also added the sucralfate.  2.  Metastatic breast cancer. Outpatient follow-up with oncology.  3.  Hypokalemia. Supplement.  4.  Anemia. Check iron B12 level.   DVT prophylaxis: Lovenox Code Status: Full Family Communication: None Disposition Plan:  . Patient came from: Home            . Anticipated d/c place: Home . Barriers to d/c OR conditions which need to be met to effect a safe d/c:   Consultants:   none  Procedures: None Antimicrobials: Flagyl and Cipro  Subjective: Patient still complaining some diarrhea, abdominal cramping pain.  Mainly upper gastric pain.  No fever or chills. No dysuria  hematuria.  Objective: Vitals:   10/02/19 0037 10/02/19 0419 10/02/19 0846 10/02/19 1248  BP: (!) 95/57 100/71 102/76 107/68  Pulse: 83 82 80 84  Resp: _0 Temp: 99.6 F (37.6 C) 98.7 F (37.1 C) 98.5 F (36.9 C) 98.8 F (37.1 C)  TempSrc: Oral Oral Oral Oral  SpO2: 100% 100% 99% 100%  Weight:      Height:        Intake/Output Summary (Last 24 hours) at 10/02/2019 1257 Last data filed at 10/02/2019 0300 Gross per 24 hour  Intake 903.1 ml  Output --  Net 903.1 ml   Filed Weights   10/01/19 0028  Weight: 61.1 kg    Examination:  General exam: Appears calm and comfortable  Respiratory system: Clear to auscultation. Respiratory effort normal. Cardiovascular system: S1 & S2 heard, RRR. No JVD, murmurs, rubs, gallops or clicks. No pedal edema. Gastrointestinal system: Abdomen is nondistended, soft and nontender. No organomegaly or masses felt. Normal bowel sounds heard. Central nervous system: Alert and oriented. No focal neurological deficits. Extremities: Symmetric 5 x 5 power. Skin: No rashes, lesions or ulcers Psychiatry: Judgement and insight appear normal. Mood & affect appropriate.     Data Reviewed: I have personally reviewed following labs and imaging studies  CBC: Recent Labs  Lab 09/26/19 0851 10/01/19 0030 10/02/19 0554  WBC 4.2 5.7 4.0  NEUTROABS 2.1  --   --   HGB 10.5* 11.3* 9.3*  HCT 31.5* 35.4* 28.4*  MCV 88.7 90.8 89.9  PLT 527* 416*  175   Basic Metabolic Panel: Recent Labs  Lab 09/26/19 0851 10/01/19 0030 10/02/19 0518  NA 138 137 136  K 3.5 3.9 3.4*  CL 104 101 104  CO2 _0 GLUCOSE 104* 126* 91  BUN 7 11 <5*  CREATININE 0.46 0.56 0.45  CALCIUM 8.8* 9.0 8.6*  MG  --   --  1.9   GFR: Estimated Creatinine Clearance: 76.3 mL/min (by C-G formula based on SCr of 0.45 mg/dL). Liver Function Tests: Recent Labs  Lab 09/26/19 0851 10/01/19 0030  AST 31 48*  ALT 45* 69*  ALKPHOS 216* 167*  BILITOT 0.5 0.7  PROT 6.9  7.4  ALBUMIN 3.2* 3.7   Recent Labs  Lab 10/01/19 0030  LIPASE 40   No results for input(s): AMMONIA in the last 168 hours. Coagulation Profile: No results for input(s): INR, PROTIME in the last 168 hours. Cardiac Enzymes: No results for input(s): CKTOTAL, CKMB, CKMBINDEX, TROPONINI in the last 168 hours. BNP (last 3 results) No results for input(s): PROBNP in the last 8760 hours. HbA1C: No results for input(s): HGBA1C in the last 72 hours. CBG: No results for input(s): GLUCAP in the last 168 hours. Lipid Profile: No results for input(s): CHOL, HDL, LDLCALC, TRIG, CHOLHDL, LDLDIRECT in the last 72 hours. Thyroid Function Tests: No results for input(s): TSH, T4TOTAL, FREET4, T3FREE, THYROIDAB in the last 72 hours. Anemia Panel: No results for input(s): VITAMINB12, FOLATE, FERRITIN, TIBC, IRON, RETICCTPCT in the last 72 hours. Sepsis Labs: No results for input(s): PROCALCITON, LATICACIDVEN in the last 168 hours.  Recent Results (from the past 240 hour(s))  Urine culture     Status: Abnormal   Collection Time: 10/01/19 12:31 AM   Specimen: Urine, Random  Result Value Ref Range Status   Specimen Description   Final    URINE, RANDOM Performed at [facility], [address] [place], [place] [address]    Special Requests   Final    NONE Performed at [facility], [address] [place], [place] [address]    Culture MULTIPLE SPECIES PRESENT, SUGGEST RECOLLECTION (A)  Final   Report Status 10/02/2019 FINAL  Final  SARS Coronavirus 2 by RT PCR (hospital order, performed in [facility] hospital lab) Nasopharyngeal Nasopharyngeal Swab     Status: None   Collection Time: 10/01/19  9:39 AM   Specimen: Nasopharyngeal Swab  Result Value Ref Range Status   SARS Coronavirus 2 NEGATIVE NEGATIVE Final    Comment: (NOTE) SARS-CoV-2 target nucleic acids are NOT DETECTED.  The SARS-CoV-2 RNA is generally detectable in upper and lower respiratory specimens during  the acute phase of infection. The lowest concentration of SARS-CoV-2 viral copies this assay can detect is 250 copies / mL. A negative result does not preclude SARS-CoV-2 infection and should not be used as the sole basis for treatment or other patient management decisions.  A negative result may occur with improper specimen collection / handling, submission of specimen other than nasopharyngeal swab, presence of viral mutation(s) within the areas targeted by this assay, and inadequate number of viral copies (<250 copies / mL). A negative result must be combined with clinical observations, patient history, and epidemiological information.  Fact Sheet for Patients:   StrictlyIdeas.no  Fact Sheet for Healthcare Providers: BankingDealers.co.za  This test is not yet approved or  cleared by the [place] FDA and has been authorized for detection and/or diagnosis of SARS-CoV-2 by FDA under an Emergency Use Authorization (EUA).  This EUA  will remain in effect (meaning this test can be used) for the duration of the COVID-19 declaration under Section 564(b)(1) of the Act, 21 U.S.C. section 360bbb-3(b)(1), unless the authorization is terminated or revoked sooner.  Performed at [facility], [address] [place], [place] [address]   CULTURE, BLOOD (ROUTINE X 2) w Reflex to ID Panel     Status: None (Preliminary result)   Collection Time: 10/01/19  9:39 AM   Specimen: BLOOD  Result Value Ref Range Status   Specimen Description BLOOD RIGHT [facility]  Final   Special Requests   Final    BOTTLES DRAWN AEROBIC AND ANAEROBIC Blood Culture adequate volume   Culture  Setup Time   Final    Organism ID to follow GRAM POSITIVE COCCI ANAEROBIC BOTTLE ONLY CRITICAL RESULT CALLED TO, READ BACK BY AND VERIFIED WITH: LISA KLUTTZ AT 1104 ON 10/02/19 SNG Performed at [facility], [address], [place], [place] [address]    Culture GRAM  POSITIVE COCCI  Final   Report Status PENDING  Incomplete  CULTURE, BLOOD (ROUTINE X 2) w Reflex to ID Panel     Status: None (Preliminary result)   Collection Time: 10/01/19  9:39 AM   Specimen: BLOOD  Result Value Ref Range Status   Specimen Description BLOOD LEFT AC  Final   Special Requests   Final    BOTTLES DRAWN AEROBIC AND ANAEROBIC Blood Culture adequate volume   Culture   Final    NO GROWTH < 24 HOURS Performed at [facility], [address], [place], [place] [address]    Report Status PENDING  Incomplete  Blood Culture ID Panel (Reflexed)     Status: Abnormal   Collection Time: 10/01/19  9:39 AM  Result Value Ref Range Status   Enterococcus species NOT DETECTED NOT DETECTED Final   Listeria monocytogenes NOT DETECTED NOT DETECTED Final   Staphylococcus species DETECTED (A) NOT DETECTED Final    Comment: Methicillin (oxacillin) susceptible coagulase negative staphylococcus. Possible blood culture contaminant (unless isolated from more than one blood culture draw or clinical case suggests pathogenicity). No antibiotic treatment is indicated for blood  culture contaminants. CRITICAL RESULT CALLED TO, READ BACK BY AND VERIFIED WITH: LISA KLUTTZ AT 1104 ON 10/02/19 SNG    Staphylococcus aureus (BCID) NOT DETECTED NOT DETECTED Final   Methicillin resistance NOT DETECTED NOT DETECTED Final   Streptococcus species NOT DETECTED NOT DETECTED Final   Streptococcus agalactiae NOT DETECTED NOT DETECTED Final   Streptococcus pneumoniae NOT DETECTED NOT DETECTED Final   Streptococcus pyogenes NOT DETECTED NOT DETECTED Final   Acinetobacter baumannii NOT DETECTED NOT DETECTED Final   Enterobacteriaceae species NOT DETECTED NOT DETECTED Final   Enterobacter cloacae complex NOT DETECTED NOT DETECTED Final   Escherichia coli NOT DETECTED NOT DETECTED Final   Klebsiella oxytoca NOT DETECTED NOT DETECTED Final   Klebsiella pneumoniae NOT DETECTED NOT DETECTED Final   Proteus  species NOT DETECTED NOT DETECTED Final   Serratia marcescens NOT DETECTED NOT DETECTED Final   Haemophilus influenzae NOT DETECTED NOT DETECTED Final   Neisseria meningitidis NOT DETECTED NOT DETECTED Final   Pseudomonas aeruginosa NOT DETECTED NOT DETECTED Final   Candida albicans NOT DETECTED NOT DETECTED Final   Candida glabrata NOT DETECTED NOT DETECTED Final   Candida krusei NOT DETECTED NOT DETECTED Final   Candida parapsilosis NOT DETECTED NOT DETECTED Final   Candida tropicalis NOT DETECTED NOT DETECTED Final    Comment: Performed at [facility],  [place], [place] [address]  Gastrointestinal Panel by PCR , Stool     Status: None   Collection Time: 10/01/19  3:43 PM   Specimen: Stool  Result Value Ref Range Status   Campylobacter species NOT DETECTED NOT DETECTED Final   Plesimonas shigelloides NOT DETECTED NOT DETECTED Final   Salmonella species NOT DETECTED NOT DETECTED Final   Yersinia enterocolitica NOT DETECTED NOT DETECTED Final   Vibrio species NOT DETECTED NOT DETECTED Final   Vibrio cholerae NOT DETECTED NOT DETECTED Final   Enteroaggregative E coli (EAEC) NOT DETECTED NOT DETECTED Final   Enteropathogenic E coli (EPEC) NOT DETECTED NOT DETECTED Final   Enterotoxigenic E coli (ETEC) NOT DETECTED NOT DETECTED Final   Shiga like toxin producing E coli (STEC) NOT DETECTED NOT DETECTED Final   Shigella/Enteroinvasive E coli (EIEC) NOT DETECTED NOT DETECTED Final   Cryptosporidium NOT DETECTED NOT DETECTED Final   Cyclospora cayetanensis NOT DETECTED NOT DETECTED Final   Entamoeba histolytica NOT DETECTED NOT DETECTED Final   Giardia lamblia NOT DETECTED NOT DETECTED Final   Adenovirus F40/41 NOT DETECTED NOT DETECTED Final   Astrovirus NOT DETECTED NOT DETECTED Final   Norovirus GI/GII NOT DETECTED NOT DETECTED Final   Rotavirus A NOT DETECTED NOT DETECTED Final   Sapovirus (I, II, IV, and V) NOT DETECTED NOT DETECTED Final    Comment:  Performed at [facility], [address] [place], [place] [address]  C Difficile Quick Screen w PCR reflex     Status: None   Collection Time: 10/01/19  3:43 PM   Specimen: Stool  Result Value Ref Range Status   C Diff antigen NEGATIVE NEGATIVE Final   C Diff toxin NEGATIVE NEGATIVE Final   C Diff interpretation No C. difficile detected.  Final    Comment: Performed at [facility], [address], [place],  [address]         Radiology Studies: CT ABDOMEN PELVIS W CONTRAST  Result Date: 10/01/2019 CLINICAL DATA:  History of metastatic breast cancer. Abdominal pain for 4 days. EXAM: CT ABDOMEN AND PELVIS WITH CONTRAST TECHNIQUE: Multidetector CT imaging of the abdomen and pelvis was performed using the standard protocol following bolus administration of intravenous contrast. CONTRAST:  81m OMNIPAQUE IOHEXOL 300 MG/ML  SOLN COMPARISON:  None. FINDINGS: Lower chest: Streaky basilar atelectasis but no infiltrates or effusions. The heart is normal in size. No pericardial effusion. No worrisome pulmonary nodules. Hepatobiliary: No worrisome hepatic lesions to suggest metastatic disease. The gallbladder is normal. No common bile duct dilatation. Pancreas: No mass, inflammation or ductal dilatation. Spleen: Normal size. No focal lesions. Adrenals/Urinary Tract: The adrenal glands and kidneys are unremarkable. No renal lesions or hydronephrosis. The bladder is unremarkable. Stomach/Bowel: The stomach is unremarkable. The duodenum and small bowel are unremarkable without oral contrast. No acute inflammatory changes, mass lesions or obstructive findings. Moderate inflammatory or infectious process involving the cecum and part of the ascending colon. There may be some mild associated secondary inflammation of the terminal ileum. The appendix is also secondarily inflamed with mild mucosal enhancement. There is also a small appendicoliths but I do not see any findings suspicious for  acute appendicitis. The remainder of the colon is unremarkable. Scattered diverticulosis is noted. Vascular/Lymphatic: The aorta and branch vessels are patent. The major venous structures are patent. No mesenteric or retroperitoneal mass or lymphadenopathy. Reproductive: The uterus and ovaries are unremarkable. Other: No significant free abdominal or free pelvic fluid collections. No abdominal wall  hernia or subcutaneous lesions. Musculoskeletal: Findings of diffuse osseous metastatic disease. Vertebral augmentation changes are noted at T6, L1 and L4. Right-sided sacral plasty also noted along with bone cement in the right iliac bone. Right hip hardware is noted. Lytic lesion noted in the L2 vertebral body. No spinal canal encroachment. There is a large amount of bone cement noted in the spinal canal at L4. IMPRESSION: 1. Moderate inflammatory or infectious process involving the cecum and part of the ascending colon with mild associated secondary inflammation of the terminal ileum. 2. The appendix is also secondarily inflamed with mild mucosal enhancement. There is also a small appendicoliths but no findings suspicious for acute appendicitis. 3. Findings of diffuse osseous metastatic disease as detailed above. Electronically Signed   By: Marijo Sanes M.D.   On: 10/01/2019 08:19   DG Chest Portable 1 View  Result Date: 10/01/2019 CLINICAL DATA:  Port-A-Cath placement. EXAM: PORTABLE CHEST 1 VIEW COMPARISON:  None. FINDINGS: Right IJ power port is in good position with its tip in the distal SVC near the cavoatrial junction. No complicating features are identified. The cardiac silhouette, mediastinal and hilar contours are within normal limits given the AP projection and portable technique. Low lung volumes with vascular crowding and streaky basilar atelectasis but no infiltrates or effusions. No pneumothorax. The bony thorax is intact. Vertebral augmentation changes are noted. IMPRESSION: Right IJ power port in  good position without complicating features. Electronically Signed   By: Marijo Sanes M.D.   On: 10/01/2019 07:12        Scheduled Meds: . Chlorhexidine Gluconate Cloth  6 each Topical Daily  . enoxaparin (LOVENOX) injection  40 mg Subcutaneous Q24H  . gabapentin  300 mg Oral BID  . pantoprazole  40 mg Oral Daily  . sucralfate  1 g Oral TID WC & HS   Continuous Infusions: . lactated ringers 1,000 mL with potassium chloride 20 mEq 75 mL/hr at 10/02/19 1103  . piperacillin-tazobactam (ZOSYN)  IV 3.375 g (10/02/19 0514)     LOS: 0 days    Time spent: 28 minutes    Sharen Hones, MD [facility]   To contact the attending provider between 7A-7P or the covering provider during after hours 7P-7A, please log into the web site [email or web address] and access using universal [place] password for that web site. If you do not have the password, please call the hospital operator.  10/02/2019, 12:57 PM

## 2019-10-02 NOTE — Progress Notes (Signed)
Initial Nutrition Assessment  DOCUMENTATION CODES:   Not applicable  INTERVENTION:   Ensure Enlive po BID, each supplement provides 350 kcal and 20 grams of protein  MVI daily  NUTRITION DIAGNOSIS:   Increased nutrient needs related to cancer and cancer related treatments as evidenced by increased estimated needs.  GOAL:   Patient will meet greater than or equal to 90% of their needs  MONITOR:   Supplement acceptance, Labs, PO intake, Weight trends, Skin, I & O's  REASON FOR ASSESSMENT:   Malnutrition Screening Tool    ASSESSMENT:   39 y.o. female with medical history significant of metastasized to breast cancer (monoclonal antibody treatment), GERD, anxiety, vertigo, who presents with abdominal pain, diarrhea   RD working remotely.  Unable to speak with pt over the phone. Pt with increased estimated needs r/t breast cancer. RD will add supplements and MVI to help pt meet her estimated needs. Per chart, pt down 4lbs(3%) in a month; this is not significant. RD will obtain nutrition related exam and history at follow up.   Medications reviewed and include: lovenox, protonix, carafate, LRS w/ KCl @75ml /hr, zosyn   Labs reviewed: K 3.4(L), BUN <5(L) Hgb 9.3(L), Hct 28.4(L)  NUTRITION - FOCUSED PHYSICAL EXAM: Unable to perform at this time   Diet Order:   Diet Order            Diet regular Room service appropriate? Yes; Fluid consistency: Thin  Diet effective now                EDUCATION NEEDS:   Not appropriate for education at this time  Skin:  Skin Assessment: Reviewed RN Assessment (ecchymosis)  Last BM:  7/14- type 3  Height:   Ht Readings from Last 1 Encounters:  10/01/19 5' (1.524 m)    Weight:   Wt Readings from Last 1 Encounters:  10/01/19 61.1 kg    Ideal Body Weight:  45.4 kg  BMI:  Body mass index is 26.31 kg/m.  Estimated Nutritional Needs:   Kcal:  1700-2000kcal/day  Protein:  85-95g/day  Fluid:  1.4-1.6L/day  Koleen Distance MS, RD, LDN Please refer to [facility] for RD and/or RD on-call/weekend/after hours pager

## 2019-10-02 NOTE — Progress Notes (Signed)
PHARMACY - PHYSICIAN COMMUNICATION CRITICAL VALUE ALERT - BLOOD CULTURE IDENTIFICATION (BCID)  Sophia [name] is an 39 y.o. female who presented to [facility] on 10/01/2019 with a chief complaint of abdominal pain and diarrhea.   Assessment:  Patient admitted with acute colitis.  BCx: 1 of 4 GPC. BCID: Staph species, Mec A (-).  Patient is currently on Zosyn for abdominal infection.   Name of physician (or Provider) Contacted: Dr. Juleen China   Current antibiotics: Zosyn   Changes to prescribed antibiotics recommended:  No changes recommended at this time.   Results for orders placed or performed during the hospital encounter of 10/01/19  Blood Culture ID Panel (Reflexed) (Collected: 10/01/2019  9:39 AM)  Result Value Ref Range   Enterococcus species NOT DETECTED NOT DETECTED   Listeria monocytogenes NOT DETECTED NOT DETECTED   Staphylococcus species DETECTED (A) NOT DETECTED   Staphylococcus aureus (BCID) NOT DETECTED NOT DETECTED   Methicillin resistance NOT DETECTED NOT DETECTED   Streptococcus species NOT DETECTED NOT DETECTED   Streptococcus agalactiae NOT DETECTED NOT DETECTED   Streptococcus pneumoniae NOT DETECTED NOT DETECTED   Streptococcus pyogenes NOT DETECTED NOT DETECTED   Acinetobacter baumannii NOT DETECTED NOT DETECTED   Enterobacteriaceae species NOT DETECTED NOT DETECTED   Enterobacter cloacae complex NOT DETECTED NOT DETECTED   Escherichia coli NOT DETECTED NOT DETECTED   Klebsiella oxytoca NOT DETECTED NOT DETECTED   Klebsiella pneumoniae NOT DETECTED NOT DETECTED   Proteus species NOT DETECTED NOT DETECTED   Serratia marcescens NOT DETECTED NOT DETECTED   Haemophilus influenzae NOT DETECTED NOT DETECTED   Neisseria meningitidis NOT DETECTED NOT DETECTED   Pseudomonas aeruginosa NOT DETECTED NOT DETECTED   Candida albicans NOT DETECTED NOT DETECTED   Candida glabrata NOT DETECTED NOT DETECTED   Candida krusei NOT DETECTED NOT DETECTED   Candida  parapsilosis NOT DETECTED NOT DETECTED   Candida tropicalis NOT DETECTED NOT DETECTED    Pernell Dupre, PharmD, BCPS Clinical Pharmacist 10/02/2019 11:12 AM

## 2019-10-02 NOTE — Progress Notes (Signed)
Cross Cover Brief Note Pain uncontrolled with oxycodone Low dose morphine ordered for breakthrough pain

## 2019-10-03 ENCOUNTER — Inpatient Hospital Stay: Payer: Self-pay

## 2019-10-03 ENCOUNTER — Encounter: Payer: Self-pay | Admitting: Orthopedic Surgery

## 2019-10-03 LAB — CBC WITH DIFFERENTIAL/PLATELET
Abs Immature Granulocytes: 0.16 10*3/uL — ABNORMAL HIGH (ref 0.00–0.07)
Basophils Absolute: 0 10*3/uL (ref 0.0–0.1)
Basophils Relative: 1 %
Eosinophils Absolute: 0.1 10*3/uL (ref 0.0–0.5)
Eosinophils Relative: 2 %
HCT: 27.2 % — ABNORMAL LOW (ref 36.0–46.0)
Hemoglobin: 9.1 g/dL — ABNORMAL LOW (ref 12.0–15.0)
Immature Granulocytes: 5 %
Lymphocytes Relative: 29 %
Lymphs Abs: 1 10*3/uL (ref 0.7–4.0)
MCH: 29.4 pg (ref 26.0–34.0)
MCHC: 33.5 g/dL (ref 30.0–36.0)
MCV: 88 fL (ref 80.0–100.0)
Monocytes Absolute: 0.3 10*3/uL (ref 0.1–1.0)
Monocytes Relative: 9 %
Neutro Abs: 2 10*3/uL (ref 1.7–7.7)
Neutrophils Relative %: 54 %
Platelets: 321 10*3/uL (ref 150–400)
RBC: 3.09 MIL/uL — ABNORMAL LOW (ref 3.87–5.11)
RDW: 15.7 % — ABNORMAL HIGH (ref 11.5–15.5)
Smear Review: NORMAL
WBC: 3.6 10*3/uL — ABNORMAL LOW (ref 4.0–10.5)
nRBC: 0 % (ref 0.0–0.2)

## 2019-10-03 LAB — BASIC METABOLIC PANEL
Anion gap: 7 (ref 5–15)
BUN: <5 mg/dL — ABNORMAL LOW (ref 6–20)
CO2: 24 mmol/L (ref 22–32)
Calcium: 8.4 mg/dL — ABNORMAL LOW (ref 8.9–10.3)
Chloride: 107 mmol/L (ref 98–111)
Creatinine, Ser: 0.53 mg/dL (ref 0.44–1.00)
GFR calc Af Amer: >60 mL/min (ref >60)
GFR calc non Af Amer: >60 mL/min (ref >60)
Glucose, Bld: 94 mg/dL (ref 70–99)
Potassium: 3.6 mmol/L (ref 3.5–5.1)
Sodium: 138 mmol/L (ref 135–145)

## 2019-10-03 LAB — MAGNESIUM: Magnesium: 1.8 mg/dL (ref 1.7–2.4)

## 2019-10-03 MED ORDER — FERROUS SULFATE 325 (65 FE) MG PO TABS
325.0000 mg | ORAL_TABLET | Freq: Every day | ORAL | Status: DC
  Administered 2019-10-03 – 2019-10-04 (×2): 325 mg via ORAL
  Filled 2019-10-03 (×2): qty 1

## 2019-10-03 MED ORDER — PANTOPRAZOLE SODIUM 40 MG PO TBEC
40.0000 mg | DELAYED_RELEASE_TABLET | Freq: Two times a day (BID) | ORAL | Status: DC
  Administered 2019-10-03 – 2019-10-04 (×3): 40 mg via ORAL
  Filled 2019-10-03 (×3): qty 1

## 2019-10-03 NOTE — Plan of Care (Signed)
  Problem: Education: Goal: Knowledge of General Education information will improve Description: Including pain rating scale, medication(s)/side effects and non-pharmacologic comfort measures 10/03/2019 1858 by Lorn Junes, RN Outcome: Progressing 10/03/2019 0803 by Lorn Junes, RN Outcome: Progressing   Problem: Health Behavior/Discharge Planning: Goal: Ability to manage health-related needs will improve 10/03/2019 1858 by Lorn Junes, RN Outcome: Progressing 10/03/2019 0803 by Lorn Junes, RN Outcome: Progressing   Problem: Clinical Measurements: Goal: Ability to maintain clinical measurements within normal limits will improve 10/03/2019 1858 by Lorn Junes, RN Outcome: Progressing 10/03/2019 0803 by Lorn Junes, RN Outcome: Progressing Goal: Will remain free from infection 10/03/2019 1858 by Lorn Junes, RN Outcome: Progressing 10/03/2019 0803 by Lorn Junes, RN Outcome: Progressing Goal: Diagnostic test results will improve 10/03/2019 1858 by Lorn Junes, RN Outcome: Progressing 10/03/2019 0803 by Lorn Junes, RN Outcome: Progressing Goal: Respiratory complications will improve 10/03/2019 1858 by Lorn Junes, RN Outcome: Progressing 10/03/2019 0803 by Lorn Junes, RN Outcome: Progressing   Problem: Nutrition: Goal: Adequate nutrition will be maintained 10/03/2019 1858 by Lorn Junes, RN Outcome: Progressing 10/03/2019 0803 by Lorn Junes, RN Outcome: Progressing   Problem: Coping: Goal: Level of anxiety will decrease 10/03/2019 1858 by Lorn Junes, RN Outcome: Progressing 10/03/2019 0803 by Lorn Junes, RN Outcome: Progressing   Problem: Pain Managment: Goal: General experience of comfort will improve 10/03/2019 1858 by Lorn Junes, RN Outcome: Progressing 10/03/2019 0803 by Lorn Junes, RN Outcome: Progressing   Problem:  Safety: Goal: Ability to remain free from injury will improve 10/03/2019 1858 by Lorn Junes, RN Outcome: Progressing 10/03/2019 0803 by Lorn Junes, RN Outcome: Progressing   Problem: Skin Integrity: Goal: Risk for impaired skin integrity will decrease 10/03/2019 1858 by Lorn Junes, RN Outcome: Progressing 10/03/2019 0803 by Lorn Junes, RN Outcome: Progressing

## 2019-10-03 NOTE — Plan of Care (Signed)
  Problem: Education: Goal: Knowledge of General Education information will improve Description: Including pain rating scale, medication(s)/side effects and non-pharmacologic comfort measures Outcome: Progressing   Problem: Health Behavior/Discharge Planning: Goal: Ability to manage health-related needs will improve Outcome: Progressing   Problem: Clinical Measurements: Goal: Ability to maintain clinical measurements within normal limits will improve Outcome: Progressing Goal: Will remain free from infection Outcome: Progressing Goal: Diagnostic test results will improve Outcome: Progressing Goal: Respiratory complications will improve Outcome: Progressing   Problem: Nutrition: Goal: Adequate nutrition will be maintained Outcome: Progressing   Problem: Coping: Goal: Level of anxiety will decrease Outcome: Progressing   Problem: Pain Managment: Goal: General experience of comfort will improve Outcome: Progressing   Problem: Safety: Goal: Ability to remain free from injury will improve Outcome: Progressing   Problem: Skin Integrity: Goal: Risk for impaired skin integrity will decrease Outcome: Progressing

## 2019-10-03 NOTE — Progress Notes (Signed)
PROGRESS NOTE    Sophia [name]  FGH:[id number] DOB: [birth year] DOA: 10/01/2019 PCP: [facility], NP   Chief complaint.  Abdominal pain or nausea vomiting.   Brief Narrative: Sophia Tech Marquezis a 39 y.o.femalewith medical history significant ofmetastasized to breast cancer (monoclonal antibody treatment), GERD, anxiety, vertigo, who presents with abdominal pain, diarrhea. Patient had a radiation therapy on her right hip last Wednesday, chemotherapy was on last Friday.  She started have abdominal pain and nausea vomiting diarrhea on Sunday. CT scan showed evidence of multiple colitis.  C. difficile toxin so far has been negative.  She is started on zosyn.  She was also given symptomatic treatment and IV fluids.  7/15.  Still has some diarrhea and upper abdominal pain.  No nausea vomiting, tolerating diet.  I will continue some IV fluids.  Blood culture was positive for coag negative staph, appears to be contaminant.  Antibiotic changed to oral Cipro and Flagyl.  7/16.  Still complaining of upper abdominal discomfort and pain.  Had 4 loose stools, but a small amount.  Increase Protonix to 40 mg twice a day, continue sucralfate.    Assessment & Plan:   Principal Problem:   Acute colitis Active Problems:   Anxiety   Metastatic breast cancer (HCC)   Abnormal LFTs   Colitis  #1.  Acute colitis.   Patient also has presentation of acute gastroenteritis.  C. difficile toxin was negative. She still has some loose stools, but amount is small.  She has not been able to eat much due to stomach discomfort.  We will continue sucralfate, increase the Protonix to twice a day.  Continue some IV fluids until patient p.o. intake is adequate. Continue Cipro and Flagyl for colitis.  2.  Iron deficient anemia. Oral supplement.  3.  Metastatic breast cancer. Follow-up with oncology as outpatient.  4.  Hypokalemia. Improved.  DVT prophylaxis: Lovenox Code Status:  Full Family Communication: None Disposition Plan:   Patient came from: Home                                                                                                                           Anticipated d/c place: Home  Barriers to d/c OR conditions which need to be met to effect a safe d/c:   Consultants:   none  Procedures: None Antimicrobials: Flagyl and Cipro   Subjective: Patient still complaining of upper stomach discomfort and pain.  Worse after taking medications.  Less nauseous, still has a poor appetite, but able to tolerate some diet. She also had 4 loose stools over the last 24 hours, the amount is small. No fever or chills. No dysuria hematuria. Denies any short of breath or cough.  Objective: Vitals:   10/02/19 2016 10/03/19 0008 10/03/19 0513 10/03/19 0746  BP: 110/74 101/61 103/78 (!) 100/58  Pulse: 88 86 87 74  Resp: _0 Temp: 98.5 F (36.9 C) 98.6 F (37 C) 98.5  F (36.9 C) 98.6 F (37 C)  TempSrc: Oral Oral Oral Oral  SpO2: 99% 96% 98% 99%  Weight:      Height:       No intake or output data in the 24 hours ending 10/03/19 0832 Filed Weights   10/01/19 0028  Weight: 61.1 kg    Examination:  General exam: Appears calm and comfortable  Respiratory system: Clear to auscultation. Respiratory effort normal. Cardiovascular system: S1 & S2 heard, RRR. No JVD, murmurs, rubs, gallops or clicks. No pedal edema. Gastrointestinal system: Abdomen is nondistended, soft and upper gastric tender. No organomegaly or masses felt. Normal bowel sounds heard. Central nervous system: Alert and oriented. No focal neurological deficits. Extremities: Symmetric 5 x 5 power. Skin: No rashes, lesions or ulcers Psychiatry: Judgement and insight appear normal. Mood & affect appropriate.     Data Reviewed: I have personally reviewed following labs and imaging studies  CBC: Recent Labs  Lab 09/26/19 0851 10/01/19 0030 10/02/19 0554  10/03/19 0610  WBC 4.2 5.7 4.0 3.6*  NEUTROABS 2.1  --   --  2.0  HGB 10.5* 11.3* 9.3* 9.1*  HCT 31.5* 35.4* 28.4* 27.2*  MCV 88.7 90.8 89.9 88.0  PLT 527* 416* 337 263   Basic Metabolic Panel: Recent Labs  Lab 09/26/19 0851 10/01/19 0030 10/02/19 0518 10/03/19 0610  NA 138 137 136 138  K 3.5 3.9 3.4* 3.6  CL 104 101 104 107  CO2 _0 GLUCOSE 104* 126* 91 94  BUN 7 11 <5* <5*  CREATININE 0.46 0.56 0.45 0.53  CALCIUM 8.8* 9.0 8.6* 8.4*  MG  --   --  1.9 1.8   GFR: Estimated Creatinine Clearance: 76.3 mL/min (by C-G formula based on SCr of 0.53 mg/dL). Liver Function Tests: Recent Labs  Lab 09/26/19 0851 10/01/19 0030  AST 31 48*  ALT 45* 69*  ALKPHOS 216* 167*  BILITOT 0.5 0.7  PROT 6.9 7.4  ALBUMIN 3.2* 3.7   Recent Labs  Lab 10/01/19 0030  LIPASE 40   No results for input(s): AMMONIA in the last 168 hours. Coagulation Profile: No results for input(s): INR, PROTIME in the last 168 hours. Cardiac Enzymes: No results for input(s): CKTOTAL, CKMB, CKMBINDEX, TROPONINI in the last 168 hours. BNP (last 3 results) No results for input(s): PROBNP in the last 8760 hours. HbA1C: No results for input(s): HGBA1C in the last 72 hours. CBG: No results for input(s): GLUCAP in the last 168 hours. Lipid Profile: No results for input(s): CHOL, HDL, LDLCALC, TRIG, CHOLHDL, LDLDIRECT in the last 72 hours. Thyroid Function Tests: No results for input(s): TSH, T4TOTAL, FREET4, T3FREE, THYROIDAB in the last 72 hours. Anemia Panel: Recent Labs    10/02/19 0518 10/02/19 0554  VITAMINB12  --  326  TIBC 286  --   IRON 23*  --    Sepsis Labs: No results for input(s): PROCALCITON, LATICACIDVEN in the last 168 hours.  Recent Results (from the past 240 hour(s))  Urine culture     Status: Abnormal   Collection Time: 10/01/19 12:31 AM   Specimen: Urine, Random  Result Value Ref Range Status   Specimen Description   Final    URINE, RANDOM Performed at [facility], [address] [place], [place] [address]    Special Requests   Final    NONE Performed at [facility], [address] [place], [place] [address]    Culture MULTIPLE SPECIES PRESENT, SUGGEST RECOLLECTION (A)  Final   Report Status 10/02/2019 FINAL  Final  SARS Coronavirus 2 by RT PCR (hospital order, performed in [facility] hospital lab) Nasopharyngeal Nasopharyngeal Swab     Status: None   Collection Time: 10/01/19  9:39 AM   Specimen: Nasopharyngeal Swab  Result Value Ref Range Status   SARS Coronavirus 2 NEGATIVE NEGATIVE Final    Comment: (NOTE) SARS-CoV-2 target nucleic acids are NOT DETECTED.  The SARS-CoV-2 RNA is generally detectable in upper and lower respiratory specimens during the acute phase of infection. The lowest concentration of SARS-CoV-2 viral copies this assay can detect is 250 copies / mL. A negative result does not preclude SARS-CoV-2 infection and should not be used as the sole basis for treatment or other patient management decisions.  A negative result may occur with improper specimen collection / handling, submission of specimen other than nasopharyngeal swab, presence of viral mutation(s) within the areas targeted by this assay, and inadequate number of viral copies (<250 copies / mL). A negative result must be combined with clinical observations, patient history, and epidemiological information.  Fact Sheet for Patients:   StrictlyIdeas.no  Fact Sheet for Healthcare Providers: BankingDealers.co.za  This test is not yet approved or  cleared by the [place] FDA and has been authorized for detection and/or diagnosis of SARS-CoV-2 by FDA under an Emergency Use Authorization (EUA).  This EUA will remain in effect (meaning this test can be used) for the duration of the COVID-19 declaration under Section 564(b)(1) of the Act, 21 U.S.C. section 360bbb-3(b)(1), unless the  authorization is terminated or revoked sooner.  Performed at [facility], [address] [place], [place] [address]   CULTURE, BLOOD (ROUTINE X 2) w Reflex to ID Panel     Status: None (Preliminary result)   Collection Time: 10/01/19  9:39 AM   Specimen: BLOOD  Result Value Ref Range Status   Specimen Description BLOOD RIGHT [facility]  Final   Special Requests   Final    BOTTLES DRAWN AEROBIC AND ANAEROBIC Blood Culture adequate volume   Culture  Setup Time   Final    Organism ID to follow GRAM POSITIVE COCCI ANAEROBIC BOTTLE ONLY CRITICAL RESULT CALLED TO, READ BACK BY AND VERIFIED WITH: LISA KLUTTZ AT 1104 ON 10/02/19 SNG Performed at [facility], [address] [place], [place] [address]    Culture GRAM POSITIVE COCCI  Final   Report Status PENDING  Incomplete  CULTURE, BLOOD (ROUTINE X 2) w Reflex to ID Panel     Status: None (Preliminary result)   Collection Time: 10/01/19  9:39 AM   Specimen: BLOOD  Result Value Ref Range Status   Specimen Description BLOOD LEFT [facility]  Final   Special Requests   Final    BOTTLES DRAWN AEROBIC AND ANAEROBIC Blood Culture adequate volume   Culture   Final    NO GROWTH 2 DAYS Performed at [facility], [address], [place], [place] [address]    Report Status PENDING  Incomplete  Blood Culture ID Panel (Reflexed)     Status: Abnormal   Collection Time: 10/01/19  9:39 AM  Result Value Ref Range Status   Enterococcus species NOT DETECTED NOT DETECTED Final   Listeria monocytogenes NOT DETECTED NOT DETECTED Final   Staphylococcus species DETECTED (A) NOT DETECTED Final    Comment: Methicillin (oxacillin) susceptible coagulase negative staphylococcus. Possible blood culture contaminant (unless isolated from more than one blood culture draw or clinical case suggests pathogenicity). No antibiotic treatment  is indicated for blood  culture contaminants. CRITICAL RESULT CALLED TO, READ BACK BY AND VERIFIED WITH: LISA  KLUTTZ AT 1104 ON 10/02/19 SNG    Staphylococcus aureus (BCID) NOT DETECTED NOT DETECTED Final   Methicillin resistance NOT DETECTED NOT DETECTED Final   Streptococcus species NOT DETECTED NOT DETECTED Final   Streptococcus agalactiae NOT DETECTED NOT DETECTED Final   Streptococcus pneumoniae NOT DETECTED NOT DETECTED Final   Streptococcus pyogenes NOT DETECTED NOT DETECTED Final   Acinetobacter baumannii NOT DETECTED NOT DETECTED Final   Enterobacteriaceae species NOT DETECTED NOT DETECTED Final   Enterobacter cloacae complex NOT DETECTED NOT DETECTED Final   Escherichia coli NOT DETECTED NOT DETECTED Final   Klebsiella oxytoca NOT DETECTED NOT DETECTED Final   Klebsiella pneumoniae NOT DETECTED NOT DETECTED Final   Proteus species NOT DETECTED NOT DETECTED Final   Serratia marcescens NOT DETECTED NOT DETECTED Final   Haemophilus influenzae NOT DETECTED NOT DETECTED Final   Neisseria meningitidis NOT DETECTED NOT DETECTED Final   Pseudomonas aeruginosa NOT DETECTED NOT DETECTED Final   Candida albicans NOT DETECTED NOT DETECTED Final   Candida glabrata NOT DETECTED NOT DETECTED Final   Candida krusei NOT DETECTED NOT DETECTED Final   Candida parapsilosis NOT DETECTED NOT DETECTED Final   Candida tropicalis NOT DETECTED NOT DETECTED Final    Comment: Performed at [facility], [address] [place], [place] [address]  Gastrointestinal Panel by PCR , Stool     Status: None   Collection Time: 10/01/19  3:43 PM   Specimen: Stool  Result Value Ref Range Status   Campylobacter species NOT DETECTED NOT DETECTED Final   Plesimonas shigelloides NOT DETECTED NOT DETECTED Final   Salmonella species NOT DETECTED NOT DETECTED Final   Yersinia enterocolitica NOT DETECTED NOT DETECTED Final   Vibrio species NOT DETECTED NOT DETECTED Final   Vibrio cholerae NOT DETECTED NOT DETECTED Final   Enteroaggregative E coli (EAEC) NOT DETECTED NOT DETECTED Final   Enteropathogenic E coli  (EPEC) NOT DETECTED NOT DETECTED Final   Enterotoxigenic E coli (ETEC) NOT DETECTED NOT DETECTED Final   Shiga like toxin producing E coli (STEC) NOT DETECTED NOT DETECTED Final   Shigella/Enteroinvasive E coli (EIEC) NOT DETECTED NOT DETECTED Final   Cryptosporidium NOT DETECTED NOT DETECTED Final   Cyclospora cayetanensis NOT DETECTED NOT DETECTED Final   Entamoeba histolytica NOT DETECTED NOT DETECTED Final   Giardia lamblia NOT DETECTED NOT DETECTED Final   Adenovirus F40/41 NOT DETECTED NOT DETECTED Final   Astrovirus NOT DETECTED NOT DETECTED Final   Norovirus GI/GII NOT DETECTED NOT DETECTED Final   Rotavirus A NOT DETECTED NOT DETECTED Final   Sapovirus (I, II, IV, and V) NOT DETECTED NOT DETECTED Final    Comment: Performed at [facility], [address] [place], [place] [address]  C Difficile Quick Screen w PCR reflex     Status: None   Collection Time: 10/01/19  3:43 PM   Specimen: Stool  Result Value Ref Range Status   C Diff antigen NEGATIVE NEGATIVE Final   C Diff toxin NEGATIVE NEGATIVE Final   C Diff interpretation No C. difficile detected.  Final    Comment: Performed at [facility], [address], [place], [place] [address]         Radiology Studies: No results found.      Scheduled Meds: . Chlorhexidine Gluconate Cloth  6 each Topical Daily  . ciprofloxacin  500 mg Oral BID  . enoxaparin (  LOVENOX) injection  40 mg Subcutaneous Q24H  . feeding supplement (ENSURE ENLIVE)  237 mL Oral BID BM  . ferrous sulfate  325 mg Oral Q breakfast  . gabapentin  300 mg Oral BID  . metroNIDAZOLE  500 mg Oral Q8H  . multivitamin with minerals  1 tablet Oral Daily  . pantoprazole  40 mg Oral BID AC  . sucralfate  1 g Oral TID WC & HS   Continuous Infusions: . lactated ringers 1,000 mL with potassium chloride 20 mEq 75 mL/hr at 10/02/19 2358     LOS: 1 day    Time spent: 28 minutes    Sharen Hones, MD [facility]   To contact  the attending provider between 7A-7P or the covering provider during after hours 7P-7A, please log into the web site [email or web address] and access using universal [place] password for that web site. If you do not have the password, please call the hospital operator.  10/03/2019, 8:32 AM

## 2019-10-04 DIAGNOSIS — R1084 Generalized abdominal pain: Secondary | ICD-10-CM

## 2019-10-04 DIAGNOSIS — K625 Hemorrhage of anus and rectum: Secondary | ICD-10-CM

## 2019-10-04 DIAGNOSIS — K529 Noninfective gastroenteritis and colitis, unspecified: Principal | ICD-10-CM

## 2019-10-04 DIAGNOSIS — R1013 Epigastric pain: Secondary | ICD-10-CM

## 2019-10-04 DIAGNOSIS — G8929 Other chronic pain: Secondary | ICD-10-CM

## 2019-10-04 LAB — BASIC METABOLIC PANEL
Anion gap: 8 (ref 5–15)
BUN: <5 mg/dL — ABNORMAL LOW (ref 6–20)
CO2: 25 mmol/L (ref 22–32)
Calcium: 9 mg/dL (ref 8.9–10.3)
Chloride: 106 mmol/L (ref 98–111)
Creatinine, Ser: 0.44 mg/dL (ref 0.44–1.00)
GFR calc Af Amer: >60 mL/min (ref >60)
GFR calc non Af Amer: >60 mL/min (ref >60)
Glucose, Bld: 108 mg/dL — ABNORMAL HIGH (ref 70–99)
Potassium: 3.8 mmol/L (ref 3.5–5.1)
Sodium: 139 mmol/L (ref 135–145)

## 2019-10-04 LAB — CULTURE, BLOOD (ROUTINE X 2): Special Requests: ADEQUATE

## 2019-10-04 LAB — MAGNESIUM: Magnesium: 1.8 mg/dL (ref 1.7–2.4)

## 2019-10-04 MED ORDER — MAGNESIUM CITRATE PO SOLN
1.0000 | Freq: Once | ORAL | Status: AC
  Administered 2019-10-04: 1 via ORAL
  Filled 2019-10-04 (×2): qty 296

## 2019-10-04 MED ORDER — SODIUM CHLORIDE 0.9% FLUSH: 10.0000 mL | INTRAVENOUS | Status: DC | PRN

## 2019-10-04 MED ORDER — SODIUM CHLORIDE 0.9% FLUSH
10.0000 mL | Freq: Two times a day (BID) | INTRAVENOUS | Status: DC
  Administered 2019-10-04 – 2019-10-05 (×2): 10 mL

## 2019-10-04 MED ORDER — TRAZODONE HCL 50 MG PO TABS
50.0000 mg | ORAL_TABLET | Freq: Every day | ORAL | Status: DC
  Administered 2019-10-04: 21:00:00 50 mg via ORAL
  Filled 2019-10-04: qty 1

## 2019-10-04 NOTE — Progress Notes (Signed)
PROGRESS NOTE    Sophia [name]  VQQ:[id number] DOB: 08/28/1980 DOA: 10/01/2019 PCP: [facility], NP   Chief complaint.  Abdominal pain and diarrhea.  Brief Narrative:   Sophia [name] a 39 y.o.femalewith medical history significant ofmetastasized to breast cancer (monoclonal antibody treatment), GERD, anxiety, vertigo, who presents with abdominal pain, diarrhea. Patient had a radiation therapy on her right hip last Wednesday, chemotherapy was on last Friday.  She started have abdominal pain and nausea vomiting diarrhea on Sunday. CT scan showed evidence of multiple colitis.  C. difficile toxin so far has been negative.  She is started on zosyn.  She was also given symptomatic treatment and IV fluids.  7/15.  Still has some diarrhea and upper abdominal pain.  No nausea vomiting, tolerating diet.  I will continue some IV fluids.  Blood culture was positive for coag negative staph, appears to be contaminant.  7/16.  Still complaining of upper abdominal discomfort and pain.  Had 4 loose stools, but a small amount.  Increase Protonix to 40 mg twice a day, continue sucralfate.  7/17.  Patient nausea vomiting abdominal pain improving.  Tolerating diet.  However, she started have rectal bleeding today.  Sigmoidoscopy scheduled for tomorrow.  Assessment & Plan:   Principal Problem:   Acute colitis Active Problems:   Anxiety   Metastatic breast cancer (HCC)   Abnormal LFTs   Colitis  #1.  Acute colitis. Symptomatically improved.  However, patient started have rectal bleeding.  Concerning for radiation colitis.  Seen by GI, scheduled for sigmoidoscopy tomorrow.  2.  Metastatic breast cancer. Outpatient follow-up.  3.  Hypokalemia. Resolved  4.  Iron deficiency anemia. Supplement orally.  DVT prophylaxis:Lovenox Code Status:Full Family Communication:None Disposition Plan:  Patient came  from:Home  Anticipated d/c place:Home  Barriers to d/c OR conditions which need to be met to effect a safe d/c:   Consultants:  none  Procedures: None Antimicrobials: Flagyl and Cipro    Subjective: Patient abdominal pain and nausea vomiting essentially resolved.  Tolerating diet.  Diarrhea is also getting better.  However, patient had a 2 episode of rectal bleeding.  Objective: Vitals:   10/04/19 0032 10/04/19 0623 10/04/19 0841 10/04/19 1142  BP: 106/76 111/84 118/65 110/74  Pulse: 85 88 92 96  Resp: '16 16 20 20  '$ Temp: 98.9 F (37.2 C) 98.8 F (37.1 C) 97.9 F (36.6 C) 98.7 F (37.1 C)  TempSrc: Oral Oral Oral Oral  SpO2: 99% 100% 100% 96%  Weight:      Height:       No intake or output data in the 24 hours ending 10/04/19 1418 Filed Weights   10/01/19 0028  Weight: 61.1 kg    Examination:  General exam: Appears calm and comfortable  Respiratory system: Clear to auscultation. Respiratory effort normal. Cardiovascular system: S1 & S2 heard, RRR. No JVD, murmurs, rubs, gallops or clicks. No pedal edema. Gastrointestinal system: Abdomen is nondistended, soft and nontender. No organomegaly or masses felt. Normal bowel sounds heard. Central nervous system: Alert and oriented. No focal neurological deficits. Extremities: Symmetric 5 x 5 power. Skin: No rashes, lesions or ulcers Psychiatry: Judgement and insight appear normal. Mood & affect appropriate.     Data Reviewed: I have personally reviewed following labs and imaging studies  CBC: Recent Labs  Lab 10/01/19 0030 10/02/19 0554 10/03/19 0610  WBC 5.7 4.0 3.6*  NEUTROABS  --   --  2.0  HGB 11.3* 9.3* 9.1*  HCT 35.4* 28.4*  27.2*  MCV 90.8 89.9 88.0  PLT 416* 337 383   Basic Metabolic Panel: Recent Labs  Lab 10/01/19 0030 10/02/19 0518 10/03/19 0610 10/04/19 0602  NA  137 136 138 139  K 3.9 3.4* 3.6 3.8  CL 101 104 107 106  CO2 '29 22 24 25  '$ GLUCOSE 126* 91 94 108*  BUN 11 <5* <5* <5*  CREATININE 0.56 0.45 0.53 0.44  CALCIUM 9.0 8.6* 8.4* 9.0  MG  --  1.9 1.8 1.8   GFR: Estimated Creatinine Clearance: 76.3 mL/min (by C-G formula based on SCr of 0.44 mg/dL). Liver Function Tests: Recent Labs  Lab 10/01/19 0030  AST 48*  ALT 69*  ALKPHOS 167*  BILITOT 0.7  PROT 7.4  ALBUMIN 3.7   Recent Labs  Lab 10/01/19 0030  LIPASE 40   No results for input(s): AMMONIA in the last 168 hours. Coagulation Profile: No results for input(s): INR, PROTIME in the last 168 hours. Cardiac Enzymes: No results for input(s): CKTOTAL, CKMB, CKMBINDEX, TROPONINI in the last 168 hours. BNP (last 3 results) No results for input(s): PROBNP in the last 8760 hours. HbA1C: No results for input(s): HGBA1C in the last 72 hours. CBG: No results for input(s): GLUCAP in the last 168 hours. Lipid Profile: No results for input(s): CHOL, HDL, LDLCALC, TRIG, CHOLHDL, LDLDIRECT in the last 72 hours. Thyroid Function Tests: No results for input(s): TSH, T4TOTAL, FREET4, T3FREE, THYROIDAB in the last 72 hours. Anemia Panel: Recent Labs    10/02/19 0518 10/02/19 0554  VITAMINB12  --  326  TIBC 286  --   IRON 23*  --    Sepsis Labs: No results for input(s): PROCALCITON, LATICACIDVEN in the last 168 hours.  Recent Results (from the past 240 hour(s))  Urine culture     Status: Abnormal   Collection Time: 10/01/19 12:31 AM   Specimen: Urine, Random  Result Value Ref Range Status   Specimen Description   Final    URINE, RANDOM Performed at [facility], [address] [place], [place] [address]    Special Requests   Final    NONE Performed at [facility], [address] [place], [place] [address]    Culture MULTIPLE SPECIES PRESENT, SUGGEST RECOLLECTION (A)  Final   Report Status 10/02/2019 FINAL  Final  SARS Coronavirus 2 by RT PCR (hospital  order, performed in [facility] hospital lab) Nasopharyngeal Nasopharyngeal Swab     Status: None   Collection Time: 10/01/19  9:39 AM   Specimen: Nasopharyngeal Swab  Result Value Ref Range Status   SARS Coronavirus 2 NEGATIVE NEGATIVE Final    Comment: (NOTE) SARS-CoV-2 target nucleic acids are NOT DETECTED.  The SARS-CoV-2 RNA is generally detectable in upper and lower respiratory specimens during the acute phase of infection. The lowest concentration of SARS-CoV-2 viral copies this assay can detect is 250 copies / mL. A negative result does not preclude SARS-CoV-2 infection and should not be used as the sole basis for treatment or other patient management decisions.  A negative result may occur with improper specimen collection / handling, submission of specimen other than nasopharyngeal swab, presence of viral mutation(s) within the areas targeted by this assay, and inadequate number of viral copies (<250 copies / mL). A negative result must be combined with clinical observations, patient history, and epidemiological information.  Fact Sheet for Patients:   StrictlyIdeas.no  Fact Sheet for Healthcare Providers: BankingDealers.co.za  This test is not yet approved or  cleared by  the [employer] and has been authorized for detection and/or diagnosis of SARS-CoV-2 by FDA under an Emergency Use Authorization (EUA).  This EUA will remain in effect (meaning this test can be used) for the duration of the COVID-19 declaration under Section 564(b)(1) of the Act, 21 U.S.C. section 360bbb-3(b)(1), unless the authorization is terminated or revoked sooner.  Performed at [facility], [address] [place], [place] [address]   CULTURE, BLOOD (ROUTINE X 2) w Reflex to ID Panel     Status: Abnormal   Collection Time: 10/01/19  9:39 AM   Specimen: BLOOD  Result Value Ref Range Status   Specimen Description   Final    BLOOD  RIGHT [facility] Performed at [facility], [address], [place], [place] [address]    Special Requests   Final    BOTTLES DRAWN AEROBIC AND ANAEROBIC Blood Culture adequate volume Performed at [facility], [address] [place], [place] [address]    Culture  Setup Time   Final    GRAM POSITIVE COCCI ANAEROBIC BOTTLE ONLY CRITICAL RESULT CALLED TO, READ BACK BY AND VERIFIED WITH: LISA KLUTTZ AT 1104 ON 10/02/19 SNG    Culture (A)  Final    STAPHYLOCOCCUS CAPRAE THE SIGNIFICANCE OF ISOLATING THIS ORGANISM FROM A SINGLE SET OF BLOOD CULTURES WHEN MULTIPLE SETS ARE DRAWN IS UNCERTAIN. PLEASE NOTIFY THE MICROBIOLOGY DEPARTMENT WITHIN ONE WEEK IF SPECIATION AND SENSITIVITIES ARE REQUIRED. Performed at [facility], [place] [address] [place], [place] [address]    Report Status 10/04/2019 FINAL  Final  CULTURE, BLOOD (ROUTINE X 2) w Reflex to ID Panel     Status: None (Preliminary result)   Collection Time: 10/01/19  9:39 AM   Specimen: BLOOD  Result Value Ref Range Status   Specimen Description BLOOD LEFT [facility]  Final   Special Requests   Final    BOTTLES DRAWN AEROBIC AND ANAEROBIC Blood Culture adequate volume   Culture   Final    NO GROWTH 3 DAYS Performed at [facility], [address] [place], [place] [address]    Report Status PENDING  Incomplete  Blood Culture ID Panel (Reflexed)     Status: Abnormal   Collection Time: 10/01/19  9:39 AM  Result Value Ref Range Status   Enterococcus species NOT DETECTED NOT DETECTED Final   Listeria monocytogenes NOT DETECTED NOT DETECTED Final   Staphylococcus species DETECTED (A) NOT DETECTED Final    Comment: Methicillin (oxacillin) susceptible coagulase negative staphylococcus. Possible blood culture contaminant (unless isolated from more than one blood culture draw or clinical case suggests pathogenicity). No antibiotic treatment is indicated for blood  culture contaminants. CRITICAL RESULT CALLED TO, READ BACK  BY AND VERIFIED WITH: LISA KLUTTZ AT 1104 ON 10/02/19 SNG    Staphylococcus aureus (BCID) NOT DETECTED NOT DETECTED Final   Methicillin resistance NOT DETECTED NOT DETECTED Final   Streptococcus species NOT DETECTED NOT DETECTED Final   Streptococcus agalactiae NOT DETECTED NOT DETECTED Final   Streptococcus pneumoniae NOT DETECTED NOT DETECTED Final   Streptococcus pyogenes NOT DETECTED NOT DETECTED Final   Acinetobacter baumannii NOT DETECTED NOT DETECTED Final   Enterobacteriaceae species NOT DETECTED NOT DETECTED Final   Enterobacter cloacae complex NOT DETECTED NOT DETECTED Final   Escherichia coli NOT DETECTED NOT DETECTED Final   Klebsiella oxytoca NOT DETECTED NOT DETECTED Final   Klebsiella pneumoniae NOT DETECTED NOT DETECTED Final   Proteus species NOT DETECTED NOT DETECTED Final  Serratia marcescens NOT DETECTED NOT DETECTED Final   Haemophilus influenzae NOT DETECTED NOT DETECTED Final   Neisseria meningitidis NOT DETECTED NOT DETECTED Final   Pseudomonas aeruginosa NOT DETECTED NOT DETECTED Final   Candida albicans NOT DETECTED NOT DETECTED Final   Candida glabrata NOT DETECTED NOT DETECTED Final   Candida krusei NOT DETECTED NOT DETECTED Final   Candida parapsilosis NOT DETECTED NOT DETECTED Final   Candida tropicalis NOT DETECTED NOT DETECTED Final    Comment: Performed at [facility], [address] [place], [place] [address]  Gastrointestinal Panel by PCR , Stool     Status: None   Collection Time: 10/01/19  3:43 PM   Specimen: Stool  Result Value Ref Range Status   Campylobacter species NOT DETECTED NOT DETECTED Final   Plesimonas shigelloides NOT DETECTED NOT DETECTED Final   Salmonella species NOT DETECTED NOT DETECTED Final   Yersinia enterocolitica NOT DETECTED NOT DETECTED Final   Vibrio species NOT DETECTED NOT DETECTED Final   Vibrio cholerae NOT DETECTED NOT DETECTED Final   Enteroaggregative E coli (EAEC) NOT DETECTED NOT DETECTED Final    Enteropathogenic E coli (EPEC) NOT DETECTED NOT DETECTED Final   Enterotoxigenic E coli (ETEC) NOT DETECTED NOT DETECTED Final   Shiga like toxin producing E coli (STEC) NOT DETECTED NOT DETECTED Final   Shigella/Enteroinvasive E coli (EIEC) NOT DETECTED NOT DETECTED Final   Cryptosporidium NOT DETECTED NOT DETECTED Final   Cyclospora cayetanensis NOT DETECTED NOT DETECTED Final   Entamoeba histolytica NOT DETECTED NOT DETECTED Final   Giardia lamblia NOT DETECTED NOT DETECTED Final   Adenovirus F40/41 NOT DETECTED NOT DETECTED Final   Astrovirus NOT DETECTED NOT DETECTED Final   Norovirus GI/GII NOT DETECTED NOT DETECTED Final   Rotavirus A NOT DETECTED NOT DETECTED Final   Sapovirus (I, II, IV, and V) NOT DETECTED NOT DETECTED Final    Comment: Performed at [facility], [address] [place], [place] [address]  C Difficile Quick Screen w PCR reflex     Status: None   Collection Time: 10/01/19  3:43 PM   Specimen: Stool  Result Value Ref Range Status   C Diff antigen NEGATIVE NEGATIVE Final   C Diff toxin NEGATIVE NEGATIVE Final   C Diff interpretation No C. difficile detected.  Final    Comment: Performed at [facility], [address] [place], [place] [address]         Radiology Studies: No results found.      Scheduled Meds: . Chlorhexidine Gluconate Cloth  6 each Topical Daily  . ciprofloxacin  500 mg Oral BID  . enoxaparin (LOVENOX) injection  40 mg Subcutaneous Q24H  . feeding supplement (ENSURE ENLIVE)  237 mL Oral BID BM  . ferrous sulfate  325 mg Oral Q breakfast  . gabapentin  300 mg Oral BID  . magnesium citrate  1 Bottle Oral Once  . metroNIDAZOLE  500 mg Oral Q8H  . multivitamin with minerals  1 tablet Oral Daily  . pantoprazole  40 mg Oral BID AC  . sodium chloride flush  10-40 mL Intracatheter Q12H  . sucralfate  1 g Oral TID WC & HS   Continuous Infusions: . lactated ringers 1,000 mL with potassium chloride 20 mEq 75 mL/hr at  10/04/19 1231     LOS: 2 days    Time spent: 25 minutes    Sharen Hones, MD [facility]   To contact the attending provider between 7A-7P or the  covering provider during after hours 7P-7A, please log into the web site [email or web address] and access using universal [place] password for that web site. If you do not have the password, please call the hospital operator.  10/04/2019, 2:18 PM

## 2019-10-04 NOTE — Consult Note (Signed)
Cephas Darby, MD [address]  [place]  [place], [place] [address]  Main: [phone]  Fax: [phone] Pager: [phone]   Consultation  Referring Provider:     No ref. provider found Primary Care Physician:  [facility], [place], NP Primary Gastroenterologist: None         Reason for Consultation:     Epigastric pain, rectal bleeding, colitis  Date of Admission:  10/01/2019 Date of Consultation:  10/04/2019         HPI:   Sophia [name] is a 39 y.o. female with history of stage IV metastatic breast cancer ER/PR negative, HER-2/neu positive with bone metastasis, s/p chemotherapy.  Patient's last dose of chemotherapy was on 09/26/2019.  Patient started experiencing epigastric abdominal pain which is worse with eating, new onset of nonbloody diarrhea since 7/13.  She went to see Dr. Janese Banks on 7/13.  She started on Protonix 40 mg p.o. daily.  However, due to worsening of symptoms, she came to ER on 7/14.  She underwent CT abdomen pelvis with contrast which revealed moderate inflammatory/infectious process involving the cecum and part of the ascending colon, mild associated secondary inflammation of the terminal ileum.  Patient is admitted for further management.  She underwent stool studies which were negative for infectious etiology.  She is empirically started on Cipro and Flagyl.  She reports that her abdominal pain has significantly improved along with diarrhea.  This morning, she had one episode of bright red blood per rectum in the toilet bowl and on wiping.  Her hemoglobin is at baseline.  No evidence of AKI she is mostly concerned about epigastric pain which is worse after eating and has been persistent.  She is on Protonix 40 mg p.o. twice daily.  She denies nausea or vomiting.  Patient is tearful that she would like to go home to celebrate her son's [age] birthday which is on Tuesday next week  NSAIDs: None  Antiplts/Anticoagulants/Anti thrombotics:  None  GI Procedures: None  Past Medical History:  Diagnosis Date  . Anxiety   . Cancer ([place])   . Vertigo     Past Surgical History:  Procedure Laterality Date  . BREAST BIOPSY Left 08/14/2019   [place] bx of mass, coil marker, path pending  . BREAST BIOPSY Left 08/14/2019   [place] bx of LN, hydromarker, path pending  . BREAST BIOPSY Left 08/14/2019   affirm bx of calcs, x marker, path pending  . INTRAMEDULLARY (IM) NAIL INTERTROCHANTERIC Right 09/01/2019   Procedure: INTRAMEDULLARY (IM) NAIL INTERTROCHANTRIC AND RADIOFREQUENCY ABLATION;  Surgeon: Hessie Knows, MD;  Location: [facility];  Service: Orthopedics;  Laterality: Right;  . KYPHOPLASTY N/A 08/29/2019   Procedure: KYPHOPLASTY T6, L1,L4 ,  RADIOFREQUENCY ABLATION;  Surgeon: Hessie Knows, MD;  Location: [facility];  Service: Orthopedics;  Laterality: N/A;  . KYPHOPLASTY Right 09/01/2019   Procedure: Right Sacral Radiofrequency Ablation and Cement Augmentation, Right sacrum and iliac crest;  Surgeon: Hessie Knows, MD;  Location: [facility];  Service: Orthopedics;  Laterality: Right;  . PORTA CATH INSERTION N/A 08/28/2019   Procedure: PORTA CATH INSERTION;  Surgeon: Algernon Huxley, MD;  Location: [facility];  Service: Cardiovascular;  Laterality: N/A;    Prior to Admission medications   Medication Sig Start Date End Date Taking? Authorizing Provider  gabapentin (NEURONTIN) 300 MG capsule Take 1 capsule (300 mg total) by mouth 2 (two) times daily. 09/26/19  Yes Sindy Guadeloupe, MD  gabapentin (NEURONTIN) 300 MG  capsule Take 1 capsule (300 mg total) by mouth 2 (two) times daily. 09/26/19  Yes Sindy Guadeloupe, MD  hydrOXYzine (ATARAX/VISTARIL) 10 MG tablet Take 1 tablet (10 mg total) by mouth 3 (three) times daily as needed for anxiety. 09/04/19  Yes Guilford Shi, MD  lidocaine-prilocaine (EMLA) cream Apply 1 application topically as needed. Apply small amount to port site at least 1 hour prior to it being accessed, cover with plastic wrap  09/10/19  Yes Sindy Guadeloupe, MD  LORazepam (ATIVAN) 0.5 MG tablet Take 1 tablet (0.5 mg total) by mouth every 6 (six) hours as needed for anxiety. 09/26/19  Yes Sindy Guadeloupe, MD  oxyCODONE (OXY IR/ROXICODONE) 5 MG immediate release tablet Take 1-2 tablets (5-10 mg total) by mouth every 4 (four) hours as needed for moderate pain. 09/11/19  Yes Sindy Guadeloupe, MD  oxyCODONE (OXYCONTIN) 10 mg 12 hr tablet Take 1 tablet (10 mg total) by mouth every 12 (twelve) hours. 09/11/19  Yes Sindy Guadeloupe, MD  pantoprazole (PROTONIX) 40 MG tablet Take 1 tablet (40 mg total) by mouth daily. 09/30/19  Yes Sindy Guadeloupe, MD  senna-docusate (SENOKOT-S) 8.6-50 MG tablet Take 1 tablet by mouth 2 (two) times daily. 09/04/19  Yes Guilford Shi, MD  enoxaparin (LOVENOX) 40 MG/0.4ML injection Inject 0.4 mLs (40 mg total) into the skin daily for 14 days. 09/05/19 09/19/19  Duanne Guess, PA-C  ondansetron (ZOFRAN) 4 MG tablet Take 1 tablet (4 mg total) by mouth every 6 (six) hours as needed for nausea or vomiting. Patient not taking: Reported on 09/10/2019 09/04/19   Sindy Guadeloupe, MD    Current Facility-Administered Medications:  .  Chlorhexidine Gluconate Cloth 2 % PADS 6 each, 6 each, Topical, Daily, Ivor Costa, MD, 6 each at 10/04/19 0857 .  ciprofloxacin (CIPRO) tablet 500 mg, 500 mg, Oral, BID, Sharen Hones, MD, 500 mg at 10/04/19 0855 .  enoxaparin (LOVENOX) injection 40 mg, 40 mg, Subcutaneous, Q24H, Ivor Costa, MD, 40 mg at 10/04/19 1229 .  feeding supplement (ENSURE ENLIVE) (ENSURE ENLIVE) liquid 237 mL, 237 mL, Oral, BID BM, Sharen Hones, MD, 237 mL at 10/04/19 1233 .  ferrous sulfate tablet 325 mg, 325 mg, Oral, Q breakfast, Sharen Hones, MD, 325 mg at 10/04/19 0855 .  gabapentin (NEURONTIN) capsule 300 mg, 300 mg, Oral, BID, Ivor Costa, MD, 300 mg at 10/04/19 0855 .  hydrOXYzine (ATARAX/VISTARIL) tablet 10 mg, 10 mg, Oral, TID PRN, Ivor Costa, MD, 10 mg at 10/03/19 2153 .  lactated ringers 1,000 mL with  potassium chloride 20 mEq, , Intravenous, Continuous, Sharen Hones, MD, Last Rate: 75 mL/hr at 10/04/19 1231, New Bag at 10/04/19 1231 .  LORazepam (ATIVAN) tablet 0.5 mg, 0.5 mg, Oral, Q6H PRN, Ivor Costa, MD, 0.5 mg at 10/04/19 1229 .  magnesium citrate solution 1 Bottle, 1 Bottle, Oral, Once, Enoc Getter, Tally Due, MD .  metroNIDAZOLE (FLAGYL) tablet 500 mg, 500 mg, Oral, Q8H, Sharen Hones, MD, 500 mg at 10/04/19 1229 .  multivitamin with minerals tablet 1 tablet, 1 tablet, Oral, Daily, Sharen Hones, MD, 1 tablet at 10/04/19 0854 .  ondansetron (ZOFRAN) injection 4 mg, 4 mg, Intravenous, Q8H PRN, Ivor Costa, MD .  oxyCODONE (Oxy IR/ROXICODONE) immediate release tablet 10 mg, 10 mg, Oral, Q4H PRN, Sharen Hones, MD, 10 mg at 10/04/19 1316 .  pantoprazole (PROTONIX) EC tablet 40 mg, 40 mg, Oral, BID AC, Sharen Hones, MD, 40 mg at 10/04/19 0855 .  sodium chloride flush (NS) 0.9 %  injection 10-40 mL, 10-40 mL, Intracatheter, Q12H, Sharen Hones, MD .  sodium chloride flush (NS) 0.9 % injection 10-40 mL, 10-40 mL, Intracatheter, PRN, Sharen Hones, MD .  sucralfate (CARAFATE) tablet 1 g, 1 g, Oral, TID WC & HS, Sharen Hones, MD, 1 g at 10/04/19 1229  Facility-Administered Medications Ordered in Other Encounters:  .  0.9 %  sodium chloride infusion, , Intravenous, Once PRN, Sindy Guadeloupe, MD, Stopped at 09/04/19 1425  Family History  Problem Relation Age of Onset  . Colon cancer Maternal Uncle      Social History   Tobacco Use  . Smoking status: Never Smoker  . Smokeless tobacco: Never Used  Vaping Use  . Vaping Use: Never used  Substance Use Topics  . Alcohol use: Yes    Comment: occassionaly  . Drug use: Not Currently    Allergies as of 09/30/2019  . (No Known Allergies)    Review of Systems:    All systems reviewed and negative except where noted in HPI.   Physical Exam:  Vital signs in last 24 hours: Temp:  [97.9 F (36.6 C)-99.1 F (37.3 C)] 98.7 F (37.1 C) (07/17  1142) Pulse Rate:  [84-97] 96 (07/17 1142) Resp:  [16-20] 20 (07/17 1142) BP: (97-118)/(64-84) 110/74 (07/17 1142) SpO2:  [96 %-100 %] 96 % (07/17 1142) Last BM Date: 10/03/19 General:   Pleasant, cooperative in NAD, ill-appearing Head:  Normocephalic and atraumatic. Eyes:   No icterus.   Conjunctiva pale. PERRLA. Ears:  Normal auditory acuity. Neck:  Supple; no masses or thyroidomegaly Lungs: Respirations even and unlabored. Lungs clear to auscultation bilaterally.   No wheezes, crackles, or rhonchi.  Heart:  Regular rate and rhythm;  Without murmur, clicks, rubs or gallops Abdomen:  Soft, nondistended, epigastric tenderness . Normal bowel sounds. No appreciable masses or hepatomegaly.  No rebound or guarding.  Rectal:  Not performed. Msk:  Symmetrical without gross deformities.  Strength generalized weakness Extremities:  Without edema, cyanosis or clubbing. Neurologic:  Alert and oriented x3;  grossly normal neurologically. Skin:  Intact without significant lesions or rashes. Psych:  Alert and cooperative. Normal affect.  LAB RESULTS: CBC Latest Ref Rng & Units 10/03/2019 10/02/2019 10/01/2019  WBC 4.0 - 10.5 K/uL 3.6(L) 4.0 5.7  Hemoglobin 12.0 - 15.0 g/dL 9.1(L) 9.3(L) 11.3(L)  Hematocrit 36 - 46 % 27.2(L) 28.4(L) 35.4(L)  Platelets 150 - 400 K/uL 321 337 416(H)    BMET BMP Latest Ref Rng & Units 10/04/2019 10/03/2019 10/02/2019  Glucose 70 - 99 mg/dL 108(H) 94 91  BUN 6 - 20 mg/dL <5(L) <5(L) <5(L)  Creatinine 0.44 - 1.00 mg/dL 0.44 0.53 0.45  Sodium 135 - 145 mmol/L 139 138 136  Potassium 3.5 - 5.1 mmol/L 3.8 3.6 3.4(L)  Chloride 98 - 111 mmol/L 106 107 104  CO2 22 - 32 mmol/L _0 Calcium 8.9 - 10.3 mg/dL 9.0 8.4(L) 8.6(L)    LFT Hepatic Function Latest Ref Rng & Units 10/01/2019 09/26/2019 09/19/2019  Total Protein 6.5 - 8.1 g/dL 7.4 6.9 7.1  Albumin 3.5 - 5.0 g/dL 3.7 3.2(L) 3.1(L)  AST 15 - 41 U/L 48(H) 31 46(H)  ALT 0 - 44 U/L 69(H) 45(H) 85(H)  Alk Phosphatase 38  - 126 U/L 167(H) 216(H) 291(H)  Total Bilirubin 0.3 - 1.2 mg/dL 0.7 0.5 0.7     STUDIES: No results found.    Impression / Plan:   Tereka Thorley is a 39 y.o. Hispanic female with stage IV  metastatic breast cancer, on chemo and s/p RFA of T6 L1-L4  Patient's last chemo was on 7/9, cycle 2 of Herceptin Perjeta and weekly Taxol Patient's GI symptoms started 2 days after her last chemo, including epigastric pain, diarrhea.  CT revealed right-sided inflammation, stool studies negative for infectious etiology.  Currently empirically treated with Cipro and Flagyl.  Her main concern is epigastric pain and an episode of rectal bleeding that occurred today  Recommend EGD and flexible sigmoidoscopy for further evaluation Agree with pantoprazole 40 mg p.o. twice daily before meals Clear liquid diet Magnesium citrate x1 today Tapwater enema tomorrow a.m. N.p.o. past midnight Likely discharge home tomorrow after procedures  Thank you for involving me in the care of this patient.      LOS: 2 days   Sherri Sear, MD  10/04/2019, 3:15 PM   Note: This dictation was prepared with Dragon dictation along with smaller phrase technology. Any transcriptional errors that result from this process are unintentional.

## 2019-10-05 ENCOUNTER — Encounter
Admission: EM | Disposition: A | Discharge status: Home / Self Care | Payer: Self-pay | Source: Home / Self Care | Attending: Internal Medicine

## 2019-10-05 ENCOUNTER — Inpatient Hospital Stay: Payer: Self-pay | Admitting: Certified Registered Nurse Anesthetist

## 2019-10-05 ENCOUNTER — Encounter: Payer: Self-pay | Admitting: Internal Medicine

## 2019-10-05 DIAGNOSIS — K6389 Other specified diseases of intestine: Secondary | ICD-10-CM

## 2019-10-05 DIAGNOSIS — R197 Diarrhea, unspecified: Secondary | ICD-10-CM

## 2019-10-05 DIAGNOSIS — R1084 Generalized abdominal pain: Secondary | ICD-10-CM

## 2019-10-05 DIAGNOSIS — K6289 Other specified diseases of anus and rectum: Secondary | ICD-10-CM

## 2019-10-05 DIAGNOSIS — R933 Abnormal findings on diagnostic imaging of other parts of digestive tract: Secondary | ICD-10-CM

## 2019-10-05 DIAGNOSIS — K449 Diaphragmatic hernia without obstruction or gangrene: Secondary | ICD-10-CM

## 2019-10-05 DIAGNOSIS — K3189 Other diseases of stomach and duodenum: Secondary | ICD-10-CM

## 2019-10-05 HISTORY — PX: ESOPHAGOGASTRODUODENOSCOPY (EGD) WITH PROPOFOL: SHX5813

## 2019-10-05 HISTORY — PX: FLEXIBLE SIGMOIDOSCOPY: SHX5431

## 2019-10-05 LAB — CBC WITH DIFFERENTIAL/PLATELET
Abs Immature Granulocytes: 0.3 10*3/uL — ABNORMAL HIGH (ref 0.00–0.07)
Basophils Absolute: 0 10*3/uL (ref 0.0–0.1)
Basophils Relative: 1 %
Eosinophils Absolute: 0 10*3/uL (ref 0.0–0.5)
Eosinophils Relative: 1 %
HCT: 30.6 % — ABNORMAL LOW (ref 36.0–46.0)
Hemoglobin: 10.3 g/dL — ABNORMAL LOW (ref 12.0–15.0)
Immature Granulocytes: 5 %
Lymphocytes Relative: 28 %
Lymphs Abs: 1.5 10*3/uL (ref 0.7–4.0)
MCH: 29.6 pg (ref 26.0–34.0)
MCHC: 33.7 g/dL (ref 30.0–36.0)
MCV: 87.9 fL (ref 80.0–100.0)
Monocytes Absolute: 0.6 10*3/uL (ref 0.1–1.0)
Monocytes Relative: 11 %
Neutro Abs: 3 10*3/uL (ref 1.7–7.7)
Neutrophils Relative %: 54 %
Platelets: 328 10*3/uL (ref 150–400)
RBC: 3.48 MIL/uL — ABNORMAL LOW (ref 3.87–5.11)
RDW: 15.8 % — ABNORMAL HIGH (ref 11.5–15.5)
Smear Review: UNDETERMINED
WBC: 5.5 10*3/uL (ref 4.0–10.5)
nRBC: 0 % (ref 0.0–0.2)

## 2019-10-05 LAB — BASIC METABOLIC PANEL
Anion gap: 3 — ABNORMAL LOW (ref 5–15)
BUN: 6 mg/dL (ref 6–20)
CO2: 26 mmol/L (ref 22–32)
Calcium: 8.7 mg/dL — ABNORMAL LOW (ref 8.9–10.3)
Chloride: 109 mmol/L (ref 98–111)
Creatinine, Ser: 0.53 mg/dL (ref 0.44–1.00)
GFR calc Af Amer: >60 mL/min (ref >60)
GFR calc non Af Amer: >60 mL/min (ref >60)
Glucose, Bld: 102 mg/dL — ABNORMAL HIGH (ref 70–99)
Potassium: 3.7 mmol/L (ref 3.5–5.1)
Sodium: 138 mmol/L (ref 135–145)

## 2019-10-05 SURGERY — ESOPHAGOGASTRODUODENOSCOPY (EGD) WITH PROPOFOL
Anesthesia: General

## 2019-10-05 MED ORDER — LIDOCAINE HCL (PF) 2 % IJ SOLN
INTRAMUSCULAR | Status: AC
  Filled 2019-10-05: qty 5

## 2019-10-05 MED ORDER — CIPROFLOXACIN HCL 500 MG PO TABS
500.0000 mg | ORAL_TABLET | Freq: Two times a day (BID) | ORAL | 0 refills | Status: AC
Start: 2019-10-05 — End: 2019-10-11

## 2019-10-05 MED ORDER — SUCRALFATE 1 G PO TABS: 1.0000 g | ORAL_TABLET | Freq: Three times a day (TID) | ORAL | 0 refills | Status: DC

## 2019-10-05 MED ORDER — FERROUS SULFATE 325 (65 FE) MG PO TABS: 325.0000 mg | ORAL_TABLET | Freq: Every day | ORAL | 0 refills | Status: DC

## 2019-10-05 MED ORDER — SODIUM CHLORIDE 0.9 % IV SOLN
INTRAVENOUS | Status: DC
  Administered 2019-10-05: 1000 mL via INTRAVENOUS

## 2019-10-05 MED ORDER — PROPOFOL 500 MG/50ML IV EMUL
INTRAVENOUS | Status: AC
  Filled 2019-10-05: qty 50

## 2019-10-05 MED ORDER — PROPOFOL 10 MG/ML IV BOLUS
INTRAVENOUS | Status: DC | PRN
  Administered 2019-10-05 (×2): 50 mg via INTRAVENOUS

## 2019-10-05 MED ORDER — METRONIDAZOLE 500 MG PO TABS: 500.0000 mg | ORAL_TABLET | Freq: Three times a day (TID) | ORAL | 0 refills | Status: AC

## 2019-10-05 MED ORDER — PROPOFOL 500 MG/50ML IV EMUL
INTRAVENOUS | Status: DC | PRN
  Administered 2019-10-05: 150 ug/kg/min via INTRAVENOUS

## 2019-10-05 MED ORDER — PHENYLEPHRINE HCL (PRESSORS) 10 MG/ML IV SOLN
INTRAVENOUS | Status: DC | PRN
  Administered 2019-10-05: 100 ug via INTRAVENOUS

## 2019-10-05 NOTE — Transfer of Care (Signed)
Immediate Anesthesia Transfer of Care Note  Patient: Sophia [name]  Procedure(s) Performed: ESOPHAGOGASTRODUODENOSCOPY (EGD) WITH PROPOFOL (N/A ) FLEXIBLE SIGMOIDOSCOPY (N/A )  Patient Location: PACU  Anesthesia Type:General  Level of Consciousness: awake and drowsy  Airway & Oxygen Therapy: Patient Spontanous Breathing and Patient connected to nasal cannula oxygen  Post-op Assessment: Report given to RN and Post -op Vital signs reviewed and stable  Post vital signs: Reviewed and stable  Last Vitals:  Vitals Value Taken Time  BP 99/56 10/05/19 1156  Temp    Pulse 58 10/05/19 1157  Resp 27 10/05/19 1156  SpO2 100 % 10/05/19 1157  Vitals shown include unvalidated device data.  Last Pain:  Vitals:   10/05/19 1055  TempSrc: Tympanic  PainSc:       Patients Stated Pain Goal: 2 (34/75/83 0746)  Complications: No complications documented.

## 2019-10-05 NOTE — Anesthesia Procedure Notes (Signed)
Date/Time: 10/05/2019 11:10 AM Performed by: Johnna Acosta, CRNA Pre-anesthesia Checklist: Patient identified, Emergency Drugs available, Suction available, Patient being monitored and Timeout performed Patient Re-evaluated:Patient Re-evaluated prior to induction Oxygen Delivery Method: Nasal cannula Preoxygenation: Pre-oxygenation with 100% oxygen Induction Type: IV induction

## 2019-10-05 NOTE — Anesthesia Preprocedure Evaluation (Signed)
Anesthesia Evaluation  Patient identified by MRN, date of birth, ID band Patient awake    Reviewed: Allergy & Precautions, H&P , NPO status , Patient's Chart, lab work & pertinent test results  History of Anesthesia Complications Negative for: history of anesthetic complications  Airway Mallampati: II  TM Distance: >3 FB Neck ROM: full    Dental no notable dental hx. (+) Teeth Intact   Pulmonary neg pulmonary ROS, neg shortness of breath, neg sleep apnea, neg COPD, Patient abstained from smoking.Not current smoker,    Pulmonary exam normal breath sounds clear to auscultation       Cardiovascular Exercise Tolerance: Good METS(-) hypertension(-) CAD and (-) Past MI negative cardio ROS Normal cardiovascular exam(-) dysrhythmias  Rhythm:Regular Rate:Normal - Systolic murmurs    Neuro/Psych PSYCHIATRIC DISORDERS Anxiety negative neurological ROS     GI/Hepatic negative GI ROS, Neg liver ROS, neg GERD  ,  Endo/Other  negative endocrine ROSneg diabetes  Renal/GU negative Renal ROS  negative genitourinary   Musculoskeletal Pathologic spine fractures, metastatic breast cancer to  bone   Abdominal   Peds  Hematology negative hematology ROS (+)   Anesthesia Other Findings Past Medical History: No date: Anxiety No date: Cancer [facility]) No date: Vertigo  Past Surgical History: 08/14/2019: BREAST BIOPSY; Left     Comment:  [place] bx of mass, coil marker, path pending 08/14/2019: BREAST BIOPSY; Left     Comment:  [place] bx of LN, hydromarker, path pending 08/14/2019: BREAST BIOPSY; Left     Comment:  affirm bx of calcs, x marker, path pending 08/28/2019: PORTA CATH INSERTION; N/A     Comment:  Procedure: PORTA CATH INSERTION;  Surgeon: Algernon Huxley,              MD;  Location: [facility];  Service:               Cardiovascular;  Laterality: N/A;  BMI    Body Mass Index: 25.32 kg/m       Reproductive/Obstetrics negative OB ROS                             Anesthesia Physical  Anesthesia Plan  ASA: II  Anesthesia Plan: General   Post-op Pain Management:    Induction: Intravenous  PONV Risk Score and Plan: 3 and Propofol infusion and TIVA  Airway Management Planned: Natural Airway and Nasal Cannula  Additional Equipment: None  Intra-op Plan:   Post-operative Plan:   Informed Consent: I have reviewed the patients History and Physical, chart, labs and discussed the procedure including the risks, benefits and alternatives for the proposed anesthesia with the patient or authorized representative who has indicated his/her understanding and acceptance.     Dental Advisory Given  Plan Discussed with: CRNA and Surgeon  Anesthesia Plan Comments: (Discussed risks of anesthesia with patient, including PONV, sore throat, lip/dental damage. Rare risks discussed as well, such as cardiorespiratory and neurological sequelae. Patient understands.)        Anesthesia Quick Evaluation

## 2019-10-05 NOTE — Op Note (Signed)
[facility] Gastroenterology Patient Name: Sophia [name] Procedure Date: 10/05/2019 10:56 AM MRN: [id number] Account #: [id number] Date of Birth: Jul 16, 1980 Admit Type: Inpatient Age: 39 Room: [facility] ENDO ROOM 4 Gender: Female Note Status: Finalized Procedure:             Upper GI endoscopy Indications:           Epigastric abdominal pain Providers:             Lin Landsman MD, MD Referring MD:          [facility] Medicines:             Monitored Anesthesia Care Complications:         No immediate complications. Estimated blood loss: None. Procedure:             Pre-Anesthesia Assessment:                        - Prior to the procedure, a History and Physical was                         performed, and patient medications and allergies were                         reviewed. The patient is competent. The risks and                         benefits of the procedure and the sedation options and                         risks were discussed with the patient. All questions                         were answered and informed consent was obtained.                         Patient identification and proposed procedure were                         verified by the physician, the nurse, the                         anesthesiologist, the anesthetist and the technician                         in the pre-procedure area in the procedure room in the                         endoscopy suite. Mental Status Examination: alert and                         oriented. Airway Examination: normal oropharyngeal                         airway and neck mobility. Respiratory Examination:                         clear to auscultation. CV Examination: normal.  Prophylactic Antibiotics: The patient does not require                         prophylactic antibiotics. Prior Anticoagulants: The                         patient has taken no previous anticoagulant or                          antiplatelet agents. ASA Grade Assessment: III - A                         patient with severe systemic disease. After reviewing                         the risks and benefits, the patient was deemed in                         satisfactory condition to undergo the procedure. The                         anesthesia plan was to use monitored anesthesia care                         (MAC). Immediately prior to administration of                         medications, the patient was re-assessed for adequacy                         to receive sedatives. The heart rate, respiratory                         rate, oxygen saturations, blood pressure, adequacy of                         pulmonary ventilation, and response to care were                         monitored throughout the procedure. The physical                         status of the patient was re-assessed after the                         procedure.                        After obtaining informed consent, the endoscope was                         passed under direct vision. Throughout the procedure,                         the patient's blood pressure, pulse, and oxygen                         saturations were monitored continuously. The Endoscope  was introduced through the mouth, and advanced to the                         second part of duodenum. The upper GI endoscopy was                         accomplished without difficulty. The patient tolerated                         the procedure well. Findings:      The duodenal bulb and second portion of the duodenum were normal.      Diffuse mildly erythematous mucosa without bleeding was found in the       gastric body. Biopsies were taken with a cold forceps for Helicobacter       pylori testing.      The incisura and gastric antrum were normal. Biopsies were taken with a       cold forceps for Helicobacter pylori testing.      The cardia and  gastric fundus were normal on retroflexion.      A medium-sized hiatal hernia was present.      The gastroesophageal junction and examined esophagus were normal. Impression:            - Normal duodenal bulb and second portion of the                         duodenum.                        - Erythematous mucosa in the gastric body. Biopsied.                        - Normal incisura and antrum. Biopsied.                        - Medium-sized hiatal hernia.                        - Normal gastroesophageal junction and esophagus. Recommendation:        - Await pathology results.                        - Resume regular diet today.                        - Follow an antireflux regimen.                        - Use Prilosec (omeprazole) 40 mg PO daily                         indefinitely. Procedure Code(s):     --- Professional ---                        [id number], Esophagogastroduodenoscopy, flexible,                         transoral; with biopsy, single or multiple Diagnosis Code(s):     --- Professional ---  K31.89, Other diseases of stomach and duodenum                        K44.9, Diaphragmatic hernia without obstruction or                         gangrene                        R10.13, Epigastric pain CPT copyright 2019 American Medical Association. All rights reserved. The codes documented in this report are preliminary and upon coder review may  be revised to meet current compliance requirements. Dr. Ulyess Mort Lin Landsman MD, MD 10/05/2019 11:25:32 AM This report has been signed electronically. Number of Addenda: 0 Note Initiated On: 10/05/2019 10:56 AM Estimated Blood Loss:  Estimated blood loss: none.      [facility]

## 2019-10-05 NOTE — Anesthesia Postprocedure Evaluation (Signed)
Anesthesia Post Note  Patient: Sophia [name]  Procedure(s) Performed: ESOPHAGOGASTRODUODENOSCOPY (EGD) WITH PROPOFOL (N/A ) FLEXIBLE SIGMOIDOSCOPY (N/A )  Patient location during evaluation: PACU Anesthesia Type: General Level of consciousness: awake and alert Pain management: pain level controlled Vital Signs Assessment: post-procedure vital signs reviewed and stable Respiratory status: spontaneous breathing, nonlabored ventilation, respiratory function stable and patient connected to nasal cannula oxygen Cardiovascular status: blood pressure returned to baseline and stable Postop Assessment: no apparent nausea or vomiting Anesthetic complications: no   No complications documented.   Last Vitals:  Vitals:   10/05/19 1217 10/05/19 1232  BP: 110/66 108/62  Pulse: 73 76  Resp: (!) 23 16  Temp:  36.5 C  SpO2: 100% 98%    Last Pain:  Vitals:   10/05/19 1232  TempSrc: Oral  PainSc:                  Martha Clan

## 2019-10-05 NOTE — Discharge Summary (Signed)
Physician Discharge Summary  Patient ID: Sophia [name] MRN: [id number] DOB/AGE: 1980-10-02 39 y.o.  Admit date: 10/01/2019 Discharge date: 10/05/2019  Admission Diagnoses:  Discharge Diagnoses:  Principal Problem:   Acute colitis Active Problems:   Anxiety   Metastatic breast cancer ([place])   Abnormal LFTs   Colitis   Generalized abdominal pain   Discharged Condition: good  Hospital Course:  Cindee Mclester Marquezis a 39 y.o.femalewith medical history significant ofmetastasized to breast cancer (monoclonal antibody treatment), GERD, anxiety, vertigo, who presents with abdominal pain, diarrhea. Patient had a radiation therapy on her right hip last Wednesday, chemotherapy was on last Friday. She started have abdominal pain and nausea vomiting diarrhea on Sunday. CT scan showed evidence of multiple colitis. C. difficile toxin so far has been negative. She is started on zosyn. She was also given symptomatic treatment and IV fluids.  7/15.Still has some diarrhea and upper abdominal pain. No nausea vomiting, tolerating diet. I will continue some IV fluids.Blood culture was positive for coag negative staph, appears to be contaminant.  7/16.Still complaining of upper abdominal discomfort and pain. Had 4 loose stools, but a small amount. Increase Protonix to 40 mg twice a day, continue sucralfate.  7/17.  Patient nausea vomiting abdominal pain improving.  Tolerating diet.  However, she started have rectal bleeding today.  Sigmoidoscopy scheduled for tomorrow.  7/18.  Patient symptom improved.  Rectal bleeding has resolved.  EGD and colonoscopy were performed, results are shown below.  We will continue antibiotics.    #1.  Acute colitis. Symptomatically improved.    Colonoscopy showed diffuse colitis.  Continue antibiotics.  2.  Metastatic breast cancer. Outpatient follow-up.  3.  Hypokalemia. Resolved  4.  Iron deficiency anemia. Supplement  orally.   Consults: ID and GI  Significant Diagnostic Studies:  Colonoscopy  The perianal and digital rectal examinations were normal. Pertinent negatives include normal sphincter tone and no palpable rectal lesions. Findings: The terminal ileum appeared normal. Biopsies were taken with a cold forceps for histology. Diffuse severe inflammation characterized by congestion (edema), erythema, friability, loss of vascularity and shallow ulcerations was found on the IC valve, in the ascending colon and in the cecum. Biopsies were taken with a cold forceps for histology. A diffuse area of mildly friable mucosa with no bleeding was found in the rectum, in the sigmoid colon, in the descending colon and in the transverse colon. Biopsies were taken with a cold forceps for histology. Estimated blood loss was minimal. The retroflexed view of the distal rectum and anal verge was normal and showed no anal or rectal abnormalities. - The examined portion of the ileum was normal. Biopsied. - Diffuse severe inflammation was found in the ascending colon and in the cecum secondary to colitis. Biopsied. - Friability with no bleeding in the rectum, in the sigmoid colon, in the descending colon and in the transverse colon. Biopsied. - The distal rectum and anal verge are normal on retroflexion view.  Recommendations: - Return patient to hospital ward for possible discharge same day. - Resume regular diet today. - Continue present medications. - Await pathology results. - Return to my office in 4 weeks.   EGD: The duodenal bulb and second portion of the duodenum were normal. Findings: Diffuse mildly erythematous mucosa without bleeding was found in the gastric body. Biopsies were taken with a cold forceps for Helicobacter pylori testing. The incisura and gastric antrum were normal. Biopsies were taken with a cold forceps for Helicobacter pylori testing. The cardia and gastric  fundus were normal on  retroflexion. A medium-sized hiatal hernia was present. The gastroesophageal junction and examined esophagus were normal. - Normal duodenal bulb and second portion of the duodenum. - Erythematous mucosa in the gastric body. Biopsied. - Normal incisura and antrum. Biopsied. - Medium-sized hiatal hernia. - Normal gastroesophageal junction and esophagus.  Recomendation: - Await pathology results. - Resume regular diet today. - Follow an antireflux regimen. - Use Prilosec (omeprazole) 40 mg PO daily indefinitely.   Treatments:  IV fluids, antibiotics.  Discharge Exam: Blood pressure 110/66, pulse 73, temperature 97.8 F (36.6 C), resp. rate (!) 23, height 5' (1.524 m), weight 60.8 kg, SpO2 100 %. General appearance: alert and cooperative Resp: clear to auscultation bilaterally Cardio: regular rate and rhythm, S1, S2 normal, no murmur, click, rub or gallop GI: soft, non-tender; bowel sounds normal; no masses,  no organomegaly Extremities: extremities normal, atraumatic, no cyanosis or edema  Disposition: Discharge disposition: 01-Home or Self Care       Discharge Instructions    Diet - low sodium heart healthy   Complete by: As directed    Increase activity slowly   Complete by: As directed      Allergies as of 10/05/2019   No Known Allergies     Medication List    TAKE these medications   ciprofloxacin 500 MG tablet Commonly known as: Cipro Take 1 tablet (500 mg total) by mouth 2 (two) times daily for 6 days.   enoxaparin 40 MG/0.4ML injection Commonly known as: LOVENOX Inject 0.4 mLs (40 mg total) into the skin daily for 14 days.   ferrous sulfate 325 (65 FE) MG tablet Take 1 tablet (325 mg total) by mouth daily with breakfast.   gabapentin 300 MG capsule Commonly known as: NEURONTIN Take 1 capsule (300 mg total) by mouth 2 (two) times daily.   gabapentin 300 MG capsule Commonly known as: NEURONTIN Take 1 capsule (300 mg total) by mouth 2 (two) times  daily.   hydrOXYzine 10 MG tablet Commonly known as: ATARAX/VISTARIL Take 1 tablet (10 mg total) by mouth 3 (three) times daily as needed for anxiety.   lidocaine-prilocaine cream Commonly known as: EMLA Apply 1 application topically as needed. Apply small amount to port site at least 1 hour prior to it being accessed, cover with plastic wrap   LORazepam 0.5 MG tablet Commonly known as: ATIVAN Take 1 tablet (0.5 mg total) by mouth every 6 (six) hours as needed for anxiety.   metroNIDAZOLE 500 MG tablet Commonly known as: FLAGYL Take 1 tablet (500 mg total) by mouth 3 (three) times daily for 10 days.   ondansetron 4 MG tablet Commonly known as: Zofran Take 1 tablet (4 mg total) by mouth every 6 (six) hours as needed for nausea or vomiting.   oxyCODONE 10 mg 12 hr tablet Commonly known as: OXYCONTIN Take 1 tablet (10 mg total) by mouth every 12 (twelve) hours.   oxyCODONE 5 MG immediate release tablet Commonly known as: Oxy IR/ROXICODONE Take 1-2 tablets (5-10 mg total) by mouth every 4 (four) hours as needed for moderate pain.   pantoprazole 40 MG tablet Commonly known as: Protonix Take 1 tablet (40 mg total) by mouth daily.   senna-docusate 8.6-50 MG tablet Commonly known as: Senokot-S Take 1 tablet by mouth 2 (two) times daily.   sucralfate 1 g tablet Commonly known as: CARAFATE Take 1 tablet (1 g total) by mouth 4 (four) times daily -  with meals and at bedtime for 14 days.  Follow-up [place], [facility], NP Follow up in 1 week(s).   Contact information: [place] [address] [phone]        Lin Landsman, MD Follow up in 4 week(s).   Specialty: Gastroenterology Contact information: [place] [address] [phone]              35 minutes Signed: Sharen Hones 10/05/2019, 12:29 PM

## 2019-10-05 NOTE — OR Nursing (Signed)
Dr Marius Ditch started with the EGD scope doing a flex-sig but on noticing the patients colon was clean, she switched to a colonascope and completed a colonoscopy

## 2019-10-05 NOTE — Op Note (Signed)
[facility] Gastroenterology Patient Name: Sophia [name] Procedure Date: 10/05/2019 10:59 AM MRN: [id number] Account #: [id number] Date of Birth: 1981/02/13 Admit Type: Inpatient Age: 39 Room: [facility] ENDO ROOM 4 Gender: Female Note Status: Finalized Procedure:             Colonoscopy Indications:           This is the patient's first colonoscopy, Generalized                         abdominal pain, Clinically significant diarrhea of                         unexplained origin, Rectal bleeding, Abnormal CT of                         the GI tract Providers:             Lin Landsman MD, MD Referring MD:          [facility] Medicines:             Monitored Anesthesia Care Complications:         No immediate complications. Estimated blood loss:                         Minimal. Procedure:             Pre-Anesthesia Assessment:                        - Prior to the procedure, a History and Physical was                         performed, and patient medications and allergies were                         reviewed. The patient is competent. The risks and                         benefits of the procedure and the sedation options and                         risks were discussed with the patient. All questions                         were answered and informed consent was obtained.                         Patient identification and proposed procedure were                         verified by the physician, the nurse, the                         anesthesiologist, the anesthetist and the technician                         in the pre-procedure area in the procedure room in the  endoscopy suite. Mental Status Examination: alert and                         oriented. Airway Examination: normal oropharyngeal                         airway and neck mobility. Respiratory Examination:                         clear to auscultation. CV Examination:  normal.                         Prophylactic Antibiotics: The patient does not require                         prophylactic antibiotics. Prior Anticoagulants: The                         patient has taken no previous anticoagulant or                         antiplatelet agents. ASA Grade Assessment: III - A                         patient with severe systemic disease. After reviewing                         the risks and benefits, the patient was deemed in                         satisfactory condition to undergo the procedure. The                         anesthesia plan was to use monitored anesthesia care                         (MAC). Immediately prior to administration of                         medications, the patient was re-assessed for adequacy                         to receive sedatives. The heart rate, respiratory                         rate, oxygen saturations, blood pressure, adequacy of                         pulmonary ventilation, and response to care were                         monitored throughout the procedure. The physical                         status of the patient was re-assessed after the                         procedure.  The Colonoscope was introduced through the anus and                         advanced to the the terminal ileum, with                         identification of the appendiceal orifice and IC                         valve. The colonoscopy was performed without                         difficulty. The patient tolerated the procedure well.                         The quality of the bowel preparation was good. Findings:      The perianal and digital rectal examinations were normal. Pertinent       negatives include normal sphincter tone and no palpable rectal lesions.      The terminal ileum appeared normal. Biopsies were taken with a cold       forceps for histology.      Diffuse severe inflammation characterized by  congestion (edema),       erythema, friability, loss of vascularity and shallow ulcerations was       found on the IC valve, in the ascending colon and in the cecum. Biopsies       were taken with a cold forceps for histology.      A diffuse area of mildly friable mucosa with no bleeding was found in       the rectum, in the sigmoid colon, in the descending colon and in the       transverse colon. Biopsies were taken with a cold forceps for histology.       Estimated blood loss was minimal.      The retroflexed view of the distal rectum and anal verge was normal and       showed no anal or rectal abnormalities. Impression:            - The examined portion of the ileum was normal.                         Biopsied.                        - Diffuse severe inflammation was found in the                         ascending colon and in the cecum secondary to colitis.                         Biopsied.                        - Friability with no bleeding in the rectum, in the                         sigmoid colon, in the descending colon and in the  transverse colon. Biopsied.                        - The distal rectum and anal verge are normal on                         retroflexion view. Recommendation:        - Return patient to hospital ward for possible                         discharge same day.                        - Resume regular diet today.                        - Continue present medications.                        - Await pathology results.                        - Return to my office in 4 weeks. Procedure Code(s):     --- Professional ---                        [phone], Colonoscopy, flexible; with biopsy, single or                         multiple Diagnosis Code(s):     --- Professional ---                        K52.9, Noninfective gastroenteritis and colitis,                         unspecified                        K62.89, Other specified diseases of  anus and rectum                        K63.89, Other specified diseases of intestine                        R10.84, Generalized abdominal pain                        R19.7, Diarrhea, unspecified                        K62.5, Hemorrhage of anus and rectum                        R93.3, Abnormal findings on diagnostic imaging of                         other parts of digestive tract CPT copyright 2019 [employer]. All rights reserved. The codes documented in this report are preliminary and upon coder review may  be revised to meet current compliance requirements. Dr. Ulyess Mort Lin Landsman MD, MD 10/05/2019 11:51:52 AM This report has been signed electronically. Number of Addenda: 0 Note Initiated On:  10/05/2019 10:59 AM Scope Withdrawal Time: 0 hours 9 minutes 25 seconds  Total Procedure Duration: 0 hours 19 minutes 34 seconds  Estimated Blood Loss:  Estimated blood loss was minimal.      [facility]

## 2019-10-06 ENCOUNTER — Encounter: Payer: Self-pay | Admitting: Gastroenterology

## 2019-10-06 LAB — CULTURE, BLOOD (ROUTINE X 2)
Culture: NO GROWTH
Special Requests: ADEQUATE

## 2019-10-10 ENCOUNTER — Inpatient Hospital Stay: Payer: Self-pay

## 2019-10-10 ENCOUNTER — Ambulatory Visit: Payer: Self-pay

## 2019-10-10 ENCOUNTER — Inpatient Hospital Stay (HOSPITAL_BASED_OUTPATIENT_CLINIC_OR_DEPARTMENT_OTHER): Payer: Self-pay | Admitting: Oncology

## 2019-10-10 ENCOUNTER — Other Ambulatory Visit: Payer: Self-pay | Admitting: *Deleted

## 2019-10-10 ENCOUNTER — Encounter: Payer: Self-pay | Admitting: Oncology

## 2019-10-10 ENCOUNTER — Encounter: Payer: Self-pay | Admitting: *Deleted

## 2019-10-10 ENCOUNTER — Other Ambulatory Visit: Payer: Self-pay

## 2019-10-10 VITALS — BP 102/70 | HR 70 | Resp 16

## 2019-10-10 VITALS — BP 114/74 | HR 85 | Temp 98.7°F | Resp 16 | Ht 60.0 in | Wt 128.2 lb

## 2019-10-10 DIAGNOSIS — G893 Neoplasm related pain (acute) (chronic): Secondary | ICD-10-CM

## 2019-10-10 DIAGNOSIS — Z95828 Presence of other vascular implants and grafts: Secondary | ICD-10-CM

## 2019-10-10 DIAGNOSIS — K529 Noninfective gastroenteritis and colitis, unspecified: Secondary | ICD-10-CM

## 2019-10-10 DIAGNOSIS — M8440XA Pathological fracture, unspecified site, initial encounter for fracture: Secondary | ICD-10-CM

## 2019-10-10 DIAGNOSIS — F419 Anxiety disorder, unspecified: Secondary | ICD-10-CM

## 2019-10-10 DIAGNOSIS — T451X5A Adverse effect of antineoplastic and immunosuppressive drugs, initial encounter: Secondary | ICD-10-CM

## 2019-10-10 DIAGNOSIS — E876 Hypokalemia: Secondary | ICD-10-CM

## 2019-10-10 DIAGNOSIS — C50912 Malignant neoplasm of unspecified site of left female breast: Secondary | ICD-10-CM

## 2019-10-10 DIAGNOSIS — D6481 Anemia due to antineoplastic chemotherapy: Secondary | ICD-10-CM

## 2019-10-10 DIAGNOSIS — C50919 Malignant neoplasm of unspecified site of unspecified female breast: Secondary | ICD-10-CM

## 2019-10-10 LAB — CBC WITH DIFFERENTIAL/PLATELET
Abs Immature Granulocytes: 0.06 10*3/uL (ref 0.00–0.07)
Basophils Absolute: 0 10*3/uL (ref 0.0–0.1)
Basophils Relative: 1 %
Eosinophils Absolute: 0.1 10*3/uL (ref 0.0–0.5)
Eosinophils Relative: 2 %
HCT: 32.8 % — ABNORMAL LOW (ref 36.0–46.0)
Hemoglobin: 10.5 g/dL — ABNORMAL LOW (ref 12.0–15.0)
Immature Granulocytes: 1 %
Lymphocytes Relative: 33 %
Lymphs Abs: 1.6 10*3/uL (ref 0.7–4.0)
MCH: 28.6 pg (ref 26.0–34.0)
MCHC: 32 g/dL (ref 30.0–36.0)
MCV: 89.4 fL (ref 80.0–100.0)
Monocytes Absolute: 0.4 10*3/uL (ref 0.1–1.0)
Monocytes Relative: 8 %
Neutro Abs: 2.8 10*3/uL (ref 1.7–7.7)
Neutrophils Relative %: 55 %
Platelets: 275 10*3/uL (ref 150–400)
RBC: 3.67 MIL/uL — ABNORMAL LOW (ref 3.87–5.11)
RDW: 15.6 % — ABNORMAL HIGH (ref 11.5–15.5)
WBC: 5 10*3/uL (ref 4.0–10.5)
nRBC: 0 % (ref 0.0–0.2)

## 2019-10-10 LAB — COMPREHENSIVE METABOLIC PANEL
ALT: 46 U/L — ABNORMAL HIGH (ref 0–44)
AST: 40 U/L (ref 15–41)
Albumin: 3.4 g/dL — ABNORMAL LOW (ref 3.5–5.0)
Alkaline Phosphatase: 116 U/L (ref 38–126)
Anion gap: 9 (ref 5–15)
BUN: <5 mg/dL — ABNORMAL LOW (ref 6–20)
CO2: 23 mmol/L (ref 22–32)
Calcium: 8.6 mg/dL — ABNORMAL LOW (ref 8.9–10.3)
Chloride: 105 mmol/L (ref 98–111)
Creatinine, Ser: 0.35 mg/dL — ABNORMAL LOW (ref 0.44–1.00)
GFR calc Af Amer: >60 mL/min (ref >60)
GFR calc non Af Amer: >60 mL/min (ref >60)
Glucose, Bld: 120 mg/dL — ABNORMAL HIGH (ref 70–99)
Potassium: 3 mmol/L — ABNORMAL LOW (ref 3.5–5.1)
Sodium: 137 mmol/L (ref 135–145)
Total Bilirubin: 0.7 mg/dL (ref 0.3–1.2)
Total Protein: 7 g/dL (ref 6.5–8.1)

## 2019-10-10 MED ORDER — HEPARIN SOD (PORK) LOCK FLUSH 100 UNIT/ML IV SOLN
500.0000 [IU] | Freq: Once | INTRAVENOUS | Status: AC
  Administered 2019-10-10: 500 [IU] via INTRAVENOUS
  Filled 2019-10-10: qty 5

## 2019-10-10 MED ORDER — OXYCODONE HCL 5 MG PO TABS: 5.0000 mg | ORAL_TABLET | ORAL | 0 refills | Status: DC | PRN

## 2019-10-10 MED ORDER — SODIUM CHLORIDE 0.9 % IV SOLN
Freq: Once | INTRAVENOUS | Status: AC
  Filled 2019-10-10: qty 250

## 2019-10-10 MED ORDER — SODIUM CHLORIDE 0.9% FLUSH
10.0000 mL | INTRAVENOUS | Status: DC | PRN
  Administered 2019-10-10: 10 mL via INTRAVENOUS
  Filled 2019-10-10: qty 10

## 2019-10-10 MED ORDER — HEPARIN SOD (PORK) LOCK FLUSH 100 UNIT/ML IV SOLN
INTRAVENOUS | Status: AC
  Filled 2019-10-10: qty 5

## 2019-10-10 MED ORDER — POTASSIUM CHLORIDE CRYS ER 20 MEQ PO TBCR
20.0000 meq | EXTENDED_RELEASE_TABLET | Freq: Every day | ORAL | 0 refills | Status: DC
Start: 2019-10-10 — End: 2019-10-31

## 2019-10-10 MED ORDER — DIPHENHYDRAMINE HCL 50 MG/ML IJ SOLN: 50.0000 mg | Freq: Once | INTRAMUSCULAR | Status: DC

## 2019-10-10 MED ORDER — SODIUM CHLORIDE 0.9 % IV SOLN
20.0000 mg | Freq: Once | INTRAVENOUS | Status: DC
  Filled 2019-10-10: qty 2

## 2019-10-10 MED ORDER — SODIUM CHLORIDE 0.9 % IV SOLN
Freq: Once | INTRAVENOUS | Status: AC
  Filled 2019-10-10: qty 1000

## 2019-10-10 MED ORDER — SODIUM CHLORIDE 0.9 % IV SOLN: 80.0000 mg/m2 | Freq: Once | INTRAVENOUS | Status: DC

## 2019-10-10 MED ORDER — FAMOTIDINE IN NACL 20-0.9 MG/50ML-% IV SOLN: 20.0000 mg | Freq: Once | INTRAVENOUS | Status: DC

## 2019-10-10 MED ORDER — HEPARIN SOD (PORK) LOCK FLUSH 100 UNIT/ML IV SOLN
500.0000 [IU] | Freq: Once | INTRAVENOUS | Status: DC | PRN
  Filled 2019-10-10: qty 5

## 2019-10-10 NOTE — Progress Notes (Signed)
Pt wants to know about foods to avoid because of her cancer. She is concerned about like eating kidney beans or lima beans. I told her it is fine to eat them but from colitis standpoint sometimes beans gives people gas and could make her stomach hurt. She should eat bland food- like boiled or broled chicken, veg. Cooked, rice, pasta plain or with butter on it, eggs, crackers. Stay away from fried food. She wants to drink milk and I said it Is fine but not sure if bother her stomach right now-jsut drink some and see if her stomach ok. I will put in for dietician ref. She has some diarrhea on and off. She also wants another med for anxiety because it did not help with anxiety and panic attack she had

## 2019-10-10 NOTE — Progress Notes (Signed)
No chemo and no xgeva per MD

## 2019-10-10 NOTE — Progress Notes (Signed)
Per Judeen Hammans RN per Dr. Janese Banks no Taxol treatment or Delton See at this time, pt to receive 20 MEQs of IV Potassium in 1 Liter NS over one hour only.

## 2019-10-10 NOTE — Progress Notes (Signed)
Hematology/Oncology Consult note [facility]  Telephone:([phone] [phone]  Patient Care Team: Center, Watson, NP as PCP - General Rico Junker, RN as Registered Nurse Theodore Demark, RN as Registered Nurse   Name of the patient: Sophia [name]  [id number]  1980-11-03   Date of visit: 10/10/19  Diagnosis- stage IV metastatic breast cancer ER/PR negative HER-2/neu positive with bone metastases  Chief complaint/ Reason for visit-post hospital discharge follow-up after acute colitis  Heme/Onc history: patient is a 39 year old Hispanic female who is here with her friend. History obtained with the help of an interpreter.Patient self palpated left breast mass which was followed by a diagnostic bilateral mammogram. Mammogram showed 3.1 x 2.9 x 1.9 cm hypoechoic mass at the 1 o'clock position of the left breast. For abnormal cortically thickened left axillary lymph nodes measuring up to 5 mm. 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 Patient was also having ongoing back pain and was seen by [facility] orthopedics Dr. Doyle Askew who ordered MRI lumbar spine without contrast which showed possible pathologic fractures of L1 and L4 vertebral bodies with greater than 50% height loss at L1 and abnormal signal involving L2-L3 S1 as well as right iliac bone concerning for metastatic disease.Patient is a single mother of 3 adult children and is very anxious today.She reports significant back pain which radiates to her bilateral thighs. Denies any focal tingling numbness or weakness. Denies any bowel bladder incontinence. Pain has been uncontrolled despite taking Tylenol. No prior history of abnormal breast biopsies. No family history of breast cancer  PET and MRIshowed 3 areas of pathologic fracture of her spine as well as  widespread bony metastatic disease and concern for impending fracture of the right hip. Given her worsening pain she was asked to come to the ER. She has been evaluated by Dr. Rudene Christians from orthopedic surgery and underwent kyphoplasty at 3 different levels.T6 L1 and L4 along with radiofrequency ablation.She also underwent prophylactic fixation of the right hip and not affected the sacral region.   Patient received first dose of Herceptin and Perjeta on 09/04/2019. Baseline echocardiogram normal.she is currently getting taxol/ herceptin/perjeta  Patient admitted to hospital for acute abdominal pain with CT findings concerning for acute colitis.  Colonoscopy showed diffuse severe inflammation with erythema friability and loss of vascularity and shallow ulcerations in the IC valve, ascending colon and cecum.   Interval history-reports that she feels much better.  Denies any abdominal pain.  No blood in stools.  She gets occasional diarrhea when she gets Herceptin and Perjeta but not recently in the last 1 week.  She says she is scared to eat because she feels certain foods can increase her risk of breast cancer spread.  ECOG PS- 1 Pain scale- 0 Opioid associated constipation- no  Review of systems- Review of Systems  Constitutional: Positive for malaise/fatigue. Negative for chills, fever and weight loss.  HENT: Negative for congestion, ear discharge and nosebleeds.   Eyes: Negative for blurred vision.  Respiratory: Negative for cough, hemoptysis, sputum production, shortness of breath and wheezing.   Cardiovascular: Negative for chest pain, palpitations, orthopnea and claudication.  Gastrointestinal: Negative for abdominal pain, blood in stool, constipation, diarrhea, heartburn, melena, nausea and vomiting.  Genitourinary: Negative for dysuria, flank pain, frequency, hematuria and urgency.  Musculoskeletal: Negative for back pain, joint pain and myalgias.  Skin: Negative for rash.    Neurological: Negative for dizziness, tingling, focal weakness, seizures, weakness and headaches.  Endo/Heme/Allergies: Does not bruise/bleed easily.  Psychiatric/Behavioral: Negative for depression and suicidal ideas. The patient does not have insomnia.       No Known Allergies   Past Medical History:  Diagnosis Date  . Anxiety   . Cancer ([employer])   . Vertigo      Past Surgical History:  Procedure Laterality Date  . BREAST BIOPSY Left 08/14/2019   [place] bx of mass, coil marker, path pending  . BREAST BIOPSY Left 08/14/2019   [place] bx of LN, hydromarker, path pending  . BREAST BIOPSY Left 08/14/2019   affirm bx of calcs, x marker, path pending  . ESOPHAGOGASTRODUODENOSCOPY (EGD) WITH PROPOFOL N/A 10/05/2019   Procedure: ESOPHAGOGASTRODUODENOSCOPY (EGD) WITH PROPOFOL;  Surgeon: Lin Landsman, MD;  Location: [place];  Service: Gastroenterology;  Laterality: N/A;  . FLEXIBLE SIGMOIDOSCOPY N/A 10/05/2019   Procedure: FLEXIBLE SIGMOIDOSCOPY;  Surgeon: Lin Landsman, MD;  Location: [facility];  Service: Gastroenterology;  Laterality: N/A;  . INTRAMEDULLARY (IM) NAIL INTERTROCHANTERIC Right 09/01/2019   Procedure: INTRAMEDULLARY (IM) NAIL INTERTROCHANTRIC AND RADIOFREQUENCY ABLATION;  Surgeon: Hessie Knows, MD;  Location: [facility] ORS;  Service: Orthopedics;  Laterality: Right;  . KYPHOPLASTY N/A 08/29/2019   Procedure: KYPHOPLASTY T6, L1,L4 ,  RADIOFREQUENCY ABLATION;  Surgeon: Hessie Knows, MD;  Location: [facility] ORS;  Service: Orthopedics;  Laterality: N/A;  . KYPHOPLASTY Right 09/01/2019   Procedure: Right Sacral Radiofrequency Ablation and Cement Augmentation, Right sacrum and iliac crest;  Surgeon: Hessie Knows, MD;  Location: [facility] OR[facility];  Service: Orthopedics;  Laterality: Right;  . PORTA CATH INSERTION N/A 08/28/2019   Procedure: PORTA CATH INSERTION;  Surgeon: Algernon Huxley, MD;  Location: [place] CV LAB;  Service: Cardiovascular;  Laterality: N/A;    Social History    Socioeconomic History  . Marital status: Single    Spouse name: Not on file  . Number of children: Not on file  . Years of education: Not on file  . Highest education level: Not on file  Occupational History  . Not on file  Tobacco Use  . Smoking status: Never Smoker  . Smokeless tobacco: Never Used  Vaping Use  . Vaping Use: Never used  Substance and Sexual Activity  . Alcohol use: Yes    Comment: occassionaly  . Drug use: Not Currently  . Sexual activity: Not Currently    Birth control/protection: None  Other Topics Concern  . Not on file  Social History Narrative   Lives at home with children   Social Determinants of Health   Financial Resource Strain:   . Difficulty of Paying Living Expenses:   Food Insecurity:   . Worried About Charity fundraiser in the Last Year:   . Arboriculturist in the Last Year:   Transportation Needs:   . Film/video editor (Medical):   Marland Kitchen Lack of Transportation (Non-Medical):   Physical Activity:   . Days of Exercise per Week:   . Minutes of Exercise per Session:   Stress:   . Feeling of Stress :   Social Connections:   . Frequency of Communication with Friends and Family:   . Frequency of Social Gatherings with Friends and Family:   . Attends Religious Services:   . Active Member of Clubs or Organizations:   . Attends Archivist Meetings:   Marland Kitchen Marital Status:   Intimate Partner Violence:   .  Fear of Current or Ex-Partner:   . Emotionally Abused:   Marland Kitchen Physically Abused:   . Sexually Abused:     Family History  Problem Relation Age of Onset  . Colon cancer Maternal Uncle      Current Outpatient Medications:  .  ciprofloxacin (CIPRO) 500 MG tablet, Take 1 tablet (500 mg total) by mouth 2 (two) times daily for 6 days., Disp: 12 tablet, Rfl: 0 .  enoxaparin (LOVENOX) 40 MG/0.4ML injection, Inject 0.4 mLs (40 mg total) into the skin daily for 14 days., Disp: 5.6 mL, Rfl: 0 .  ferrous sulfate 325 (65 FE) MG tablet,  Take 1 tablet (325 mg total) by mouth daily with breakfast., Disp: 30 tablet, Rfl: 0 .  gabapentin (NEURONTIN) 300 MG capsule, Take 1 capsule (300 mg total) by mouth 2 (two) times daily., Disp: 60 capsule, Rfl: 1 .  gabapentin (NEURONTIN) 300 MG capsule, Take 1 capsule (300 mg total) by mouth 2 (two) times daily., Disp: 60 capsule, Rfl: 1 .  hydrOXYzine (ATARAX/VISTARIL) 10 MG tablet, Take 1 tablet (10 mg total) by mouth 3 (three) times daily as needed for anxiety., Disp: 30 tablet, Rfl: 0 .  lidocaine-prilocaine (EMLA) cream, Apply 1 application topically as needed. Apply small amount to port site at least 1 hour prior to it being accessed, cover with plastic wrap, Disp: 30 g, Rfl: 1 .  LORazepam (ATIVAN) 0.5 MG tablet, Take 1 tablet (0.5 mg total) by mouth every 6 (six) hours as needed for anxiety., Disp: 30 tablet, Rfl: 0 .  metroNIDAZOLE (FLAGYL) 500 MG tablet, Take 1 tablet (500 mg total) by mouth 3 (three) times daily for 10 days., Disp: 30 tablet, Rfl: 0 .  ondansetron (ZOFRAN) 4 MG tablet, Take 1 tablet (4 mg total) by mouth every 6 (six) hours as needed for nausea or vomiting. (Patient not taking: Reported on 09/10/2019), Disp: 60 tablet, Rfl: 0 .  oxyCODONE (OXY IR/ROXICODONE) 5 MG immediate release tablet, Take 1-2 tablets (5-10 mg total) by mouth every 4 (four) hours as needed for moderate pain., Disp: 120 tablet, Rfl: 0 .  oxyCODONE (OXYCONTIN) 10 mg 12 hr tablet, Take 1 tablet (10 mg total) by mouth every 12 (twelve) hours., Disp: 60 tablet, Rfl: 0 .  pantoprazole (PROTONIX) 40 MG tablet, Take 1 tablet (40 mg total) by mouth daily., Disp: 30 tablet, Rfl: 1 .  senna-docusate (SENOKOT-S) 8.6-50 MG tablet, Take 1 tablet by mouth 2 (two) times daily., Disp: 30 tablet, Rfl: 1 .  sucralfate (CARAFATE) 1 g tablet, Take 1 tablet (1 g total) by mouth 4 (four) times daily -  with meals and at bedtime for 14 days., Disp: 56 tablet, Rfl: 0 No current facility-administered medications for this  visit.  Facility-Administered Medications Ordered in Other Visits:  .  0.9 %  sodium chloride infusion, , Intravenous, Once PRN, Sindy Guadeloupe, MD, Stopped at 09/04/19 1425 .  sodium chloride flush (NS) 0.9 % injection 10 mL, 10 mL, Intravenous, PRN, Sindy Guadeloupe, MD, 10 mL at 10/10/19 0755  Physical exam:  Vitals:   10/10/19 0906  BP: 114/74  Pulse: 85  Resp: 16  Temp: 98.7 F (37.1 C)  TempSrc: Oral  Weight: 128 lb 3.2 oz (58.2 kg)  Height: 5' (1.524 m)   Physical Exam Cardiovascular:     Rate and Rhythm: Normal rate and regular rhythm.     Heart sounds: Normal heart sounds.  Pulmonary:     Effort: Pulmonary effort is normal.  Breath sounds: Normal breath sounds.  Abdominal:     General: Bowel sounds are normal. There is no distension.     Palpations: Abdomen is soft.     Tenderness: There is no abdominal tenderness.  Skin:    General: Skin is warm and dry.  Neurological:     Mental Status: She is alert and oriented to person, place, and time.      CMP Latest Ref Rng & Units 10/10/2019  Glucose 70 - 99 mg/dL 120(H)  BUN 6 - 20 mg/dL <5(L)  Creatinine 0.44 - 1.00 mg/dL 0.35(L)  Sodium 135 - 145 mmol/L 137  Potassium 3.5 - 5.1 mmol/L 3.0(L)  Chloride 98 - 111 mmol/L 105  CO2 22 - 32 mmol/L 23  Calcium 8.9 - 10.3 mg/dL 8.6(L)  Total Protein 6.5 - 8.1 g/dL 7.0  Total Bilirubin 0.3 - 1.2 mg/dL 0.7  Alkaline Phos 38 - 126 U/L 116  AST 15 - 41 U/L 40  ALT 0 - 44 U/L 46(H)   CBC Latest Ref Rng & Units 10/10/2019  WBC 4.0 - 10.5 K/uL 5.0  Hemoglobin 12.0 - 15.0 g/dL 10.5(L)  Hematocrit 36 - 46 % 32.8(L)  Platelets 150 - 400 K/uL 275    No images are attached to the encounter.  CT ABDOMEN PELVIS W CONTRAST  Result Date: 10/01/2019 CLINICAL DATA:  History of metastatic breast cancer. Abdominal pain for 4 days. EXAM: CT ABDOMEN AND PELVIS WITH CONTRAST TECHNIQUE: Multidetector CT imaging of the abdomen and pelvis was performed using the standard protocol  following bolus administration of intravenous contrast. CONTRAST:  76m OMNIPAQUE IOHEXOL 300 MG/ML  SOLN COMPARISON:  None. FINDINGS: Lower chest: Streaky basilar atelectasis but no infiltrates or effusions. The heart is normal in size. No pericardial effusion. No worrisome pulmonary nodules. Hepatobiliary: No worrisome hepatic lesions to suggest metastatic disease. The gallbladder is normal. No common bile duct dilatation. Pancreas: No mass, inflammation or ductal dilatation. Spleen: Normal size. No focal lesions. Adrenals/Urinary Tract: The adrenal glands and kidneys are unremarkable. No renal lesions or hydronephrosis. The bladder is unremarkable. Stomach/Bowel: The stomach is unremarkable. The duodenum and small bowel are unremarkable without oral contrast. No acute inflammatory changes, mass lesions or obstructive findings. Moderate inflammatory or infectious process involving the cecum and part of the ascending colon. There may be some mild associated secondary inflammation of the terminal ileum. The appendix is also secondarily inflamed with mild mucosal enhancement. There is also a small appendicoliths but I do not see any findings suspicious for acute appendicitis. The remainder of the colon is unremarkable. Scattered diverticulosis is noted. Vascular/Lymphatic: The aorta and branch vessels are patent. The major venous structures are patent. No mesenteric or retroperitoneal mass or lymphadenopathy. Reproductive: The uterus and ovaries are unremarkable. Other: No significant free abdominal or free pelvic fluid collections. No abdominal wall hernia or subcutaneous lesions. Musculoskeletal: Findings of diffuse osseous metastatic disease. Vertebral augmentation changes are noted at T6, L1 and L4. Right-sided sacral plasty also noted along with bone cement in the right iliac bone. Right hip hardware is noted. Lytic lesion noted in the L2 vertebral body. No spinal canal encroachment. There is a large amount of  bone cement noted in the spinal canal at L4. IMPRESSION: 1. Moderate inflammatory or infectious process involving the cecum and part of the ascending colon with mild associated secondary inflammation of the terminal ileum. 2. The appendix is also secondarily inflamed with mild mucosal enhancement. There is also a small appendicoliths but no findings suspicious  for acute appendicitis. 3. Findings of diffuse osseous metastatic disease as detailed above. Electronically Signed   By: Marijo Sanes M.D.   On: 10/01/2019 08:19   DG Chest Portable 1 View  Result Date: 10/01/2019 CLINICAL DATA:  Port-A-Cath placement. EXAM: PORTABLE CHEST 1 VIEW COMPARISON:  None. FINDINGS: Right IJ power port is in good position with its tip in the distal SVC near the cavoatrial junction. No complicating features are identified. The cardiac silhouette, mediastinal and hilar contours are within normal limits given the AP projection and portable technique. Low lung volumes with vascular crowding and streaky basilar atelectasis but no infiltrates or effusions. No pneumothorax. The bony thorax is intact. Vertebral augmentation changes are noted. IMPRESSION: Right IJ power port in good position without complicating features. Electronically Signed   By: Marijo Sanes M.D.   On: 10/01/2019 07:12     Assessment and plan- Patient is a 39 y.o. female with metastatic ER negative her 2 positive breast cancer on taxol/herceptin and perjeta.  This is a post hospital discharge follow-up following admission for acute colitis   Patient was recently admitted to the hospital for episode of acute abdominal pain.  Was found to have Evidence of acute colitis in the portion of her descending colon.  She also underwent colonoscopy and pathology in a part of the ascending colon showed marked active colitis but negative for any architectural changes of chronic colitis.  In terms of acute colitis, this does not appear to be thromboembolic phenomena  causing ischemic colitis as only a part of the ascending colon was involved but other areas including the Transverse colon which also falls in the watershed area was not involved.  Infectious colitis remains the most likely possibility.  Usually these cases resolve in 7 to 10 days.  Patient already reports feeling better at this time and does not have any abdominal pain.     Although chemotherapy can rarely cause colitis, Taxol is not among the common drugs to cause such a side effect although there are case reports suggestive of that.  At this time I would like to hold off her chemotherapy for 1 more week.  She will be seen by covering MD in 1 week to assess her ongoing symptoms.  Last Taxol chemotherapy was given on 09/26/2019.  If she continues to remain asymptomatic, I would like to rechallenge her with Taxol at a lower dose of 65 mg per metered square.  Patient has ER negative HER-2 positive metastatic breast cancer and Taxol forms an important component of her treatment.  When she comes back in 1 week she will receive Taxol Herceptin and Perjeta.  We will add as needed Lomotil for her diarrhea  Bone metastases: We will discuss start of Xgeva when I see her back in 2 weeks time  Neoplasm related pain: Continue OxyContin, oxycodone and gabapentin.  Anxiety: Continue as needed Ativan  Hypokalemia: We will send prescription for oral potassium and give IV 20 mEq of potassium today with 1 L of normal saline   Visit Diagnosis 1. Antineoplastic chemotherapy induced anemia   2. Acute colitis   3. Primary cancer of left breast with metastasis to other site ([place])   4. Neoplasm related pain   5. Anxiety      Dr. Randa Evens, MD, MPH [facility] at [facility] [phone] 10/10/2019 8:33 AM

## 2019-10-10 NOTE — Progress Notes (Signed)
Saw patient today.  She is currently staying with her friend Alyson Locket, since she got out of the hospital.  States she is very sad when she goes home.  Offered support.  Asked if the [employer] and [employer] gift cards were helping since she is not able to cook at this time.  She is appreciative of our assistance.  Will give more gift cards this week.

## 2019-10-13 ENCOUNTER — Telehealth: Payer: Self-pay

## 2019-10-13 ENCOUNTER — Other Ambulatory Visit: Payer: Self-pay | Admitting: *Deleted

## 2019-10-13 DIAGNOSIS — C50919 Malignant neoplasm of unspecified site of unspecified female breast: Secondary | ICD-10-CM

## 2019-10-13 DIAGNOSIS — K529 Noninfective gastroenteritis and colitis, unspecified: Secondary | ICD-10-CM

## 2019-10-13 LAB — SURGICAL PATHOLOGY

## 2019-10-13 NOTE — Telephone Encounter (Signed)
Used the Interpreter services the ID number was [phone] to call patient to scheduled a hospital follow up with Dr. Marius Ditch. They called and left a message for call back for patient.

## 2019-10-13 NOTE — Telephone Encounter (Signed)
Faxed the [place] application to [place] with [employer].

## 2019-10-13 NOTE — Telephone Encounter (Signed)
-----   Message from Lin Landsman, MD sent at 10/10/2019 12:16 PM EDT ----- Regarding: Re: Sophia [name] Please apply for entyvio, chemo induced colitis New start H/o metastatic breast cancer  RV

## 2019-10-17 ENCOUNTER — Inpatient Hospital Stay: Payer: Self-pay

## 2019-10-17 ENCOUNTER — Inpatient Hospital Stay (HOSPITAL_BASED_OUTPATIENT_CLINIC_OR_DEPARTMENT_OTHER): Payer: Self-pay | Admitting: Internal Medicine

## 2019-10-17 ENCOUNTER — Encounter: Payer: Self-pay | Admitting: Internal Medicine

## 2019-10-17 ENCOUNTER — Other Ambulatory Visit: Payer: Self-pay

## 2019-10-17 DIAGNOSIS — T451X5A Adverse effect of antineoplastic and immunosuppressive drugs, initial encounter: Secondary | ICD-10-CM

## 2019-10-17 DIAGNOSIS — K529 Noninfective gastroenteritis and colitis, unspecified: Secondary | ICD-10-CM

## 2019-10-17 DIAGNOSIS — C50919 Malignant neoplasm of unspecified site of unspecified female breast: Secondary | ICD-10-CM

## 2019-10-17 DIAGNOSIS — F419 Anxiety disorder, unspecified: Secondary | ICD-10-CM

## 2019-10-17 DIAGNOSIS — C50912 Malignant neoplasm of unspecified site of left female breast: Secondary | ICD-10-CM

## 2019-10-17 DIAGNOSIS — D6481 Anemia due to antineoplastic chemotherapy: Secondary | ICD-10-CM

## 2019-10-17 DIAGNOSIS — M8440XA Pathological fracture, unspecified site, initial encounter for fracture: Secondary | ICD-10-CM

## 2019-10-17 DIAGNOSIS — Z95828 Presence of other vascular implants and grafts: Secondary | ICD-10-CM

## 2019-10-17 DIAGNOSIS — G893 Neoplasm related pain (acute) (chronic): Secondary | ICD-10-CM

## 2019-10-17 LAB — COMPREHENSIVE METABOLIC PANEL
ALT: 40 U/L (ref 0–44)
AST: 35 U/L (ref 15–41)
Albumin: 3.5 g/dL (ref 3.5–5.0)
Alkaline Phosphatase: 109 U/L (ref 38–126)
Anion gap: 8 (ref 5–15)
BUN: 8 mg/dL (ref 6–20)
CO2: 25 mmol/L (ref 22–32)
Calcium: 8.9 mg/dL (ref 8.9–10.3)
Chloride: 102 mmol/L (ref 98–111)
Creatinine, Ser: 0.56 mg/dL (ref 0.44–1.00)
GFR calc Af Amer: >60 mL/min (ref >60)
GFR calc non Af Amer: >60 mL/min (ref >60)
Glucose, Bld: 153 mg/dL — ABNORMAL HIGH (ref 70–99)
Potassium: 4 mmol/L (ref 3.5–5.1)
Sodium: 135 mmol/L (ref 135–145)
Total Bilirubin: 1 mg/dL (ref 0.3–1.2)
Total Protein: 7.8 g/dL (ref 6.5–8.1)

## 2019-10-17 LAB — CBC WITH DIFFERENTIAL/PLATELET
Abs Immature Granulocytes: 0.04 10*3/uL (ref 0.00–0.07)
Basophils Absolute: 0 10*3/uL (ref 0.0–0.1)
Basophils Relative: 1 %
Eosinophils Absolute: 0.3 10*3/uL (ref 0.0–0.5)
Eosinophils Relative: 4 %
HCT: 33.4 % — ABNORMAL LOW (ref 36.0–46.0)
Hemoglobin: 10.7 g/dL — ABNORMAL LOW (ref 12.0–15.0)
Immature Granulocytes: 1 %
Lymphocytes Relative: 21 %
Lymphs Abs: 1.6 10*3/uL (ref 0.7–4.0)
MCH: 28.1 pg (ref 26.0–34.0)
MCHC: 32 g/dL (ref 30.0–36.0)
MCV: 87.7 fL (ref 80.0–100.0)
Monocytes Absolute: 0.6 10*3/uL (ref 0.1–1.0)
Monocytes Relative: 7 %
Neutro Abs: 5.1 10*3/uL (ref 1.7–7.7)
Neutrophils Relative %: 66 %
Platelets: 340 10*3/uL (ref 150–400)
RBC: 3.81 MIL/uL — ABNORMAL LOW (ref 3.87–5.11)
RDW: 14.9 % (ref 11.5–15.5)
WBC: 7.7 10*3/uL (ref 4.0–10.5)
nRBC: 0 % (ref 0.0–0.2)

## 2019-10-17 MED ORDER — HEPARIN SOD (PORK) LOCK FLUSH 100 UNIT/ML IV SOLN
500.0000 [IU] | Freq: Once | INTRAVENOUS | Status: AC | PRN
  Administered 2019-10-17: 500 [IU]
  Filled 2019-10-17: qty 5

## 2019-10-17 MED ORDER — DIPHENHYDRAMINE HCL 50 MG/ML IJ SOLN
50.0000 mg | Freq: Once | INTRAMUSCULAR | Status: AC
  Administered 2019-10-17: 50 mg via INTRAVENOUS
  Filled 2019-10-17: qty 1

## 2019-10-17 MED ORDER — TRASTUZUMAB CHEMO 150 MG IV SOLR
6.0000 mg/kg | Freq: Once | INTRAVENOUS | Status: AC
  Administered 2019-10-17: 357 mg via INTRAVENOUS
  Filled 2019-10-17: qty 17

## 2019-10-17 MED ORDER — SODIUM CHLORIDE 0.9 % IV SOLN
420.0000 mg | Freq: Once | INTRAVENOUS | Status: AC
  Administered 2019-10-17: 420 mg via INTRAVENOUS
  Filled 2019-10-17: qty 14

## 2019-10-17 MED ORDER — SODIUM CHLORIDE 0.9 % IV SOLN
20.0000 mg | Freq: Once | INTRAVENOUS | Status: AC
  Administered 2019-10-17: 20 mg via INTRAVENOUS
  Filled 2019-10-17: qty 20

## 2019-10-17 MED ORDER — FAMOTIDINE IN NACL 20-0.9 MG/50ML-% IV SOLN
20.0000 mg | Freq: Once | INTRAVENOUS | Status: AC
  Administered 2019-10-17: 20 mg via INTRAVENOUS
  Filled 2019-10-17: qty 50

## 2019-10-17 MED ORDER — HEPARIN SOD (PORK) LOCK FLUSH 100 UNIT/ML IV SOLN
INTRAVENOUS | Status: AC
  Filled 2019-10-17: qty 5

## 2019-10-17 MED ORDER — SODIUM CHLORIDE 0.9 % IV SOLN
65.0000 mg/m2 | Freq: Once | INTRAVENOUS | Status: AC
  Administered 2019-10-17: 108 mg via INTRAVENOUS
  Filled 2019-10-17: qty 18

## 2019-10-17 MED ORDER — DIPHENOXYLATE-ATROPINE 2.5-0.025 MG PO TABS
1.0000 | ORAL_TABLET | Freq: Four times a day (QID) | ORAL | 0 refills | Status: DC | PRN
Start: 2019-10-17 — End: 2019-11-21

## 2019-10-17 MED ORDER — SODIUM CHLORIDE 0.9% FLUSH
10.0000 mL | INTRAVENOUS | Status: DC | PRN
  Administered 2019-10-17: 10 mL via INTRAVENOUS
  Filled 2019-10-17: qty 10

## 2019-10-17 MED ORDER — SODIUM CHLORIDE 0.9 % IV SOLN
Freq: Once | INTRAVENOUS | Status: AC
  Filled 2019-10-17: qty 250

## 2019-10-17 MED ORDER — ACETAMINOPHEN 325 MG PO TABS
650.0000 mg | ORAL_TABLET | Freq: Once | ORAL | Status: AC
  Administered 2019-10-17: 650 mg via ORAL
  Filled 2019-10-17: qty 2

## 2019-10-17 NOTE — Assessment & Plan Note (Addendum)
#  Breast cancer-metastatic HER-2/neu positive-currently on Taxol Herceptin Perjeta first-line.  Tolerating chemotherapy with moderate to severe side effects-see below [acute colitis]  #Proceed with Taxol Herceptin Perjeta today.  Taxol by 20%-given recent acute colitis.  Today. Labs today reviewed;  acceptable for treatment today.   # acute desceding colitis--question etiology.;  Currently improved.  Given prescription for Lomotil.  # bone pain- on nacrotics stable. Needs zometa;   DISPOSITION: # chemo today # keep appts as planned-ADD- in 1 week- MD appt; labs/chemo- taxol-herpceptin; ADD ZOMETA- Dr.B

## 2019-10-17 NOTE — Progress Notes (Signed)
[place] NOTE  Patient Care Team: [facility] [place], NP as PCP - General Rico Junker, RN as Registered Nurse Theodore Demark, RN as Registered Nurse  CHIEF COMPLAINTS/PURPOSE OF CONSULTATION:  Breast cancer  #  Oncology History  Metastatic breast cancer ([place])  08/29/2019 Initial Diagnosis   Metastatic breast cancer ([place])   09/04/2019 -  Chemotherapy   The patient had trastuzumab (HERCEPTIN) 450 mg in sodium chloride 0.9 % 250 mL chemo infusion, 483 mg (100 % of original dose 8 mg/kg), Intravenous,  Once, 3 of 6 cycles Dose modification: 8 mg/kg (original dose 8 mg/kg, Cycle 1, Reason: Other (see comments), Comment: loading dose), 6 mg/kg (original dose 6 mg/kg, Cycle 2, Reason: Other (see comments), Comment: per md) Administration: 450 mg (09/04/2019), 357 mg (09/26/2019), 357 mg (10/17/2019) PACLitaxel (TAXOL) 132 mg in sodium chloride 0.9 % 250 mL chemo infusion (</= '80mg'$ /m2), 80 mg/m2 = 132 mg, Intravenous,  Once, 3 of 6 cycles Dose modification: 65 mg/m2 (original dose 80 mg/m2, Cycle 3, Reason: Dose not tolerated) Administration: 132 mg (09/11/2019), 132 mg (09/26/2019), 108 mg (10/17/2019), 132 mg (09/19/2019) pertuzumab (PERJETA) 840 mg in sodium chloride 0.9 % 250 mL chemo infusion, 840 mg, Intravenous, Once, 3 of 6 cycles Administration: 840 mg (09/04/2019), 420 mg (09/26/2019), 420 mg (10/17/2019) trastuzumab-dkst (OGIVRI) 483 mg in sodium chloride 0.9 % 250 mL chemo infusion, 8 mg/kg = 483 mg, Intravenous,  Once, 1 of 1 cycle  for chemotherapy treatment.       HISTORY OF PRESENTING ILLNESS:  Sophia [name] 39 y.o.  female history of metastatic ER negative HER-2/neu positive breast cancer is currently here for follow-up.  Patient was recently admitted to hospital for acute ischemic colitis-etiology unclear.  Currently abdominal pain diarrhea as well.  Continues to have mild bone pain; continues OxyContin.  Oxycodone only as  needed.  Review of Systems  Constitutional: Positive for weight loss. Negative for chills, diaphoresis, fever and malaise/fatigue.  HENT: Negative for nosebleeds and sore throat.   Eyes: Negative for double vision.  Respiratory: Negative for cough, hemoptysis, sputum production, shortness of breath and wheezing.   Cardiovascular: Negative for chest pain, palpitations, orthopnea and leg swelling.  Gastrointestinal: Positive for diarrhea. Negative for abdominal pain, blood in stool, constipation, heartburn, melena, nausea and vomiting.  Genitourinary: Negative for dysuria, frequency and urgency.  Musculoskeletal: Positive for back pain and joint pain.  Skin: Negative.  Negative for itching and rash.  Neurological: Negative for dizziness, tingling, focal weakness, weakness and headaches.  Endo/Heme/Allergies: Does not bruise/bleed easily.  Psychiatric/Behavioral: Negative for depression. The patient is not nervous/anxious and does not have insomnia.      MEDICAL HISTORY:  Past Medical History:  Diagnosis Date  . Anxiety   . Breast cancer ([place])    with mets  . Cancer ([place])   . Colitis   . Vertigo     SURGICAL HISTORY: Past Surgical History:  Procedure Laterality Date  . BREAST BIOPSY Left 08/14/2019   [place] bx of mass, coil marker, path pending  . BREAST BIOPSY Left 08/14/2019   [place] bx of LN, hydromarker, path pending  . BREAST BIOPSY Left 08/14/2019   affirm bx of calcs, x marker, path pending  . ESOPHAGOGASTRODUODENOSCOPY (EGD) WITH PROPOFOL N/A 10/05/2019   Procedure: ESOPHAGOGASTRODUODENOSCOPY (EGD) WITH PROPOFOL;  Surgeon: Lin Landsman, MD;  Location: [facility];  Service: Gastroenterology;  Laterality: N/A;  . FLEXIBLE SIGMOIDOSCOPY N/A 10/05/2019   Procedure: FLEXIBLE SIGMOIDOSCOPY;  Surgeon:  Lin Landsman, MD;  Location: [facility];  Service: Gastroenterology;  Laterality: N/A;  . INTRAMEDULLARY (IM) NAIL INTERTROCHANTERIC Right 09/01/2019   Procedure:  INTRAMEDULLARY (IM) NAIL INTERTROCHANTRIC AND RADIOFREQUENCY ABLATION;  Surgeon: Hessie Knows, MD;  Location: [facility];  Service: Orthopedics;  Laterality: Right;  . KYPHOPLASTY N/A 08/29/2019   Procedure: KYPHOPLASTY T6, L1,L4 ,  RADIOFREQUENCY ABLATION;  Surgeon: Hessie Knows, MD;  Location: [facility];  Service: Orthopedics;  Laterality: N/A;  . KYPHOPLASTY Right 09/01/2019   Procedure: Right Sacral Radiofrequency Ablation and Cement Augmentation, Right sacrum and iliac crest;  Surgeon: Hessie Knows, MD;  Location: [facility];  Service: Orthopedics;  Laterality: Right;  . PORTA CATH INSERTION N/A 08/28/2019   Procedure: PORTA CATH INSERTION;  Surgeon: Algernon Huxley, MD;  Location: [facility];  Service: Cardiovascular;  Laterality: N/A;    SOCIAL HISTORY: Social History   Socioeconomic History  . Marital status: Single    Spouse name: Not on file  . Number of children: Not on file  . Years of education: Not on file  . Highest education level: Not on file  Occupational History  . Not on file  Tobacco Use  . Smoking status: Never Smoker  . Smokeless tobacco: Never Used  Vaping Use  . Vaping Use: Never used  Substance and Sexual Activity  . Alcohol use: Not Currently  . Drug use: Not Currently  . Sexual activity: Not Currently    Birth control/protection: None  Other Topics Concern  . Not on file  Social History Narrative   Lives at home with children   Social Determinants of Health   Financial Resource Strain:   . Difficulty of Paying Living Expenses:   Food Insecurity:   . Worried About Charity fundraiser in the Last Year:   . Arboriculturist in the Last Year:   Transportation Needs:   . Film/video editor (Medical):   Marland Kitchen Lack of Transportation (Non-Medical):   Physical Activity:   . Days of Exercise per Week:   . Minutes of Exercise per Session:   Stress:   . Feeling of Stress :   Social Connections:   . Frequency of Communication with Friends and Family:    . Frequency of Social Gatherings with Friends and Family:   . Attends Religious Services:   . Active Member of Clubs or Organizations:   . Attends Archivist Meetings:   Marland Kitchen Marital Status:   Intimate Partner Violence:   . Fear of Current or Ex-Partner:   . Emotionally Abused:   Marland Kitchen Physically Abused:   . Sexually Abused:     FAMILY HISTORY: Family History  Problem Relation Age of Onset  . Colon cancer Maternal Uncle     ALLERGIES:  has No Known Allergies.  MEDICATIONS:  Current Outpatient Medications  Medication Sig Dispense Refill  . gabapentin (NEURONTIN) 300 MG capsule Take 1 capsule (300 mg total) by mouth 2 (two) times daily. 60 capsule 1  . lidocaine-prilocaine (EMLA) cream Apply 1 application topically as needed. Apply small amount to port site at least 1 hour prior to it being accessed, cover with plastic wrap 30 g 1  . LORazepam (ATIVAN) 0.5 MG tablet Take 1 tablet (0.5 mg total) by mouth every 6 (six) hours as needed for anxiety. 30 tablet 0  . oxyCODONE (OXY IR/ROXICODONE) 5 MG immediate release tablet Take 1-2 tablets (5-10 mg total) by mouth every 4 (four) hours as needed for moderate pain. [place]  tablet 0  . oxyCODONE (OXYCONTIN) 10 mg 12 hr tablet Take 1 tablet (10 mg total) by mouth every 12 (twelve) hours. 60 tablet 0  . potassium chloride SA (KLOR-CON) 20 MEQ tablet Take 1 tablet (20 mEq total) by mouth daily. 7 tablet 0  . diphenoxylate-atropine (LOMOTIL) 2.5-0.025 MG tablet Take 1 tablet by mouth 4 (four) times daily as needed for diarrhea or loose stools. 30 tablet 0  . ferrous sulfate 325 (65 FE) MG tablet Take 1 tablet (325 mg total) by mouth daily with breakfast. (Patient not taking: Reported on 10/17/2019) 30 tablet 0  . hydrOXYzine (ATARAX/VISTARIL) 10 MG tablet Take 1 tablet (10 mg total) by mouth 3 (three) times daily as needed for anxiety. (Patient not taking: Reported on 10/10/2019) 30 tablet 0  . ondansetron (ZOFRAN) 4 MG tablet Take 1 tablet (4 mg  total) by mouth every 6 (six) hours as needed for nausea or vomiting. (Patient not taking: Reported on 09/10/2019) 60 tablet 0  . pantoprazole (PROTONIX) 40 MG tablet Take 1 tablet (40 mg total) by mouth daily. (Patient not taking: Reported on 10/10/2019) 30 tablet 1  . senna-docusate (SENOKOT-S) 8.6-50 MG tablet Take 1 tablet by mouth 2 (two) times daily. (Patient not taking: Reported on 10/10/2019) 30 tablet 1  . sucralfate (CARAFATE) 1 g tablet Take 1 tablet (1 g total) by mouth 4 (four) times daily -  with meals and at bedtime for 14 days. (Patient not taking: Reported on 10/17/2019) 56 tablet 0   No current facility-administered medications for this visit.      Marland Kitchen  PHYSICAL EXAMINATION: ECOG PERFORMANCE STATUS: 1 - Symptomatic but completely ambulatory  Vitals:   10/17/19 0915  BP: 117/74  Pulse: (!) 107  Resp: 18  Temp: (!) 96.7 F (35.9 C)  SpO2: 100%   Filed Weights   10/17/19 0915  Weight: 125 lb 12.8 oz (57.1 kg)    Physical Exam Constitutional:      Comments: Patient ambulating with a walker.   HENT:     Head: Normocephalic and atraumatic.     Mouth/Throat:     Pharynx: No oropharyngeal exudate.  Eyes:     Pupils: Pupils are equal, round, and reactive to light.  Cardiovascular:     Rate and Rhythm: Normal rate and regular rhythm.  Pulmonary:     Effort: Pulmonary effort is normal. No respiratory distress.     Breath sounds: Normal breath sounds. No wheezing.  Abdominal:     General: Bowel sounds are normal. There is no distension.     Palpations: Abdomen is soft. There is no mass.     Tenderness: There is no abdominal tenderness. There is no guarding or rebound.  Musculoskeletal:        General: No tenderness. Normal range of motion.     Cervical back: Normal range of motion and neck supple.  Skin:    General: Skin is warm.  Neurological:     Mental Status: She is alert and oriented to person, place, and time.  Psychiatric:        Mood and Affect: Affect  normal.      LABORATORY DATA:  I have reviewed the data as listed Lab Results  Component Value Date   WBC 7.7 10/17/2019   HGB 10.7 (L) 10/17/2019   HCT 33.4 (L) 10/17/2019   MCV 87.7 10/17/2019   PLT 340 10/17/2019   Recent Labs    10/01/19 0030 10/02/19 0518 10/05/19 0408 10/10/19 0748 10/17/19 0900  NA 137   < > 138 137 135  K 3.9   < > 3.7 3.0* 4.0  CL 101   < > 109 105 102  CO2 29   < > '26 23 25  '$ GLUCOSE 126*   < > 102* 120* 153*  BUN 11   < > 6 <5* 8  CREATININE 0.56   < > 0.53 0.35* 0.56  CALCIUM 9.0   < > 8.7* 8.6* 8.9  GFRNONAA >60   < > >60 >60 >60  GFRAA >60   < > >60 >60 >60  PROT 7.4  --   --  7.0 7.8  ALBUMIN 3.7  --   --  3.4* 3.5  AST 48*  --   --  40 35  ALT 69*  --   --  46* 40  ALKPHOS 167*  --   --  116 109  BILITOT 0.7  --   --  0.7 1.0   < > = values in this interval not displayed.    RADIOGRAPHIC STUDIES: I have personally reviewed the radiological images as listed and agreed with the findings in the report. CT ABDOMEN PELVIS W CONTRAST  Result Date: 10/01/2019 CLINICAL DATA:  History of metastatic breast cancer. Abdominal pain for 4 days. EXAM: CT ABDOMEN AND PELVIS WITH CONTRAST TECHNIQUE: Multidetector CT imaging of the abdomen and pelvis was performed using the standard protocol following bolus administration of intravenous contrast. CONTRAST:  65m OMNIPAQUE IOHEXOL 300 MG/ML  SOLN COMPARISON:  None. FINDINGS: Lower chest: Streaky basilar atelectasis but no infiltrates or effusions. The heart is normal in size. No pericardial effusion. No worrisome pulmonary nodules. Hepatobiliary: No worrisome hepatic lesions to suggest metastatic disease. The gallbladder is normal. No common bile duct dilatation. Pancreas: No mass, inflammation or ductal dilatation. Spleen: Normal size. No focal lesions. Adrenals/Urinary Tract: The adrenal glands and kidneys are unremarkable. No renal lesions or hydronephrosis. The bladder is unremarkable. Stomach/Bowel: The  stomach is unremarkable. The duodenum and small bowel are unremarkable without oral contrast. No acute inflammatory changes, mass lesions or obstructive findings. Moderate inflammatory or infectious process involving the cecum and part of the ascending colon. There may be some mild associated secondary inflammation of the terminal ileum. The appendix is also secondarily inflamed with mild mucosal enhancement. There is also a small appendicoliths but I do not see any findings suspicious for acute appendicitis. The remainder of the colon is unremarkable. Scattered diverticulosis is noted. Vascular/Lymphatic: The aorta and branch vessels are patent. The major venous structures are patent. No mesenteric or retroperitoneal mass or lymphadenopathy. Reproductive: The uterus and ovaries are unremarkable. Other: No significant free abdominal or free pelvic fluid collections. No abdominal wall hernia or subcutaneous lesions. Musculoskeletal: Findings of diffuse osseous metastatic disease. Vertebral augmentation changes are noted at T6, L1 and L4. Right-sided sacral plasty also noted along with bone cement in the right iliac bone. Right hip hardware is noted. Lytic lesion noted in the L2 vertebral body. No spinal canal encroachment. There is a large amount of bone cement noted in the spinal canal at L4. IMPRESSION: 1. Moderate inflammatory or infectious process involving the cecum and part of the ascending colon with mild associated secondary inflammation of the terminal ileum. 2. The appendix is also secondarily inflamed with mild mucosal enhancement. There is also a small appendicoliths but no findings suspicious for acute appendicitis. 3. Findings of diffuse osseous metastatic disease as detailed above. Electronically Signed   By: PRicky StabsD.  On: 10/01/2019 08:19   DG Chest Portable 1 View  Result Date: 10/01/2019 CLINICAL DATA:  Port-A-Cath placement. EXAM: PORTABLE CHEST 1 VIEW COMPARISON:  None. FINDINGS:  Right IJ power port is in good position with its tip in the distal SVC near the cavoatrial junction. No complicating features are identified. The cardiac silhouette, mediastinal and hilar contours are within normal limits given the AP projection and portable technique. Low lung volumes with vascular crowding and streaky basilar atelectasis but no infiltrates or effusions. No pneumothorax. The bony thorax is intact. Vertebral augmentation changes are noted. IMPRESSION: Right IJ power port in good position without complicating features. Electronically Signed   By: Marijo Sanes M.D.   On: 10/01/2019 07:12    ASSESSMENT & PLAN:   Primary cancer of left breast with metastasis to other site [facility]) # Breast cancer-metastatic HER-2/neu positive-currently on Taxol Herceptin Perjeta first-line.  Tolerating chemotherapy with moderate to severe side effects-see below [acute colitis]  #Proceed with Taxol Herceptin Perjeta today.  Taxol by 20%-given recent acute colitis.  Today. Labs today reviewed;  acceptable for treatment today.   # acute desceding colitis--question etiology.;  Currently improved.  Given prescription for Lomotil.  # bone pain- on nacrotics stable. Needs zometa;   DISPOSITION: # chemo today # keep appts as planned-ADD- in 1 week- MD appt; labs/chemo- taxol-herpceptin; ADD ZOMETA- Dr.B  All questions were answered. The patient knows to call the clinic with any problems, questions or concerns.    Cammie Sickle, MD 10/19/2019 9:33 PM

## 2019-10-17 NOTE — Progress Notes (Signed)
HR 107, ok to proceed with tx per MD

## 2019-10-18 LAB — CANCER ANTIGEN 27.29: CA 27.29: 28.2 U/mL (ref 0.0–38.6)

## 2019-10-18 LAB — CANCER ANTIGEN 15-3: CA 15-3: 32.5 U/mL — ABNORMAL HIGH (ref 0.0–25.0)

## 2019-10-20 ENCOUNTER — Inpatient Hospital Stay: Payer: Self-pay

## 2019-10-20 ENCOUNTER — Inpatient Hospital Stay: Payer: Self-pay | Attending: Oncology

## 2019-10-20 ENCOUNTER — Other Ambulatory Visit: Payer: Self-pay

## 2019-10-20 DIAGNOSIS — Z5112 Encounter for antineoplastic immunotherapy: Secondary | ICD-10-CM | POA: Insufficient documentation

## 2019-10-20 DIAGNOSIS — Z79899 Other long term (current) drug therapy: Secondary | ICD-10-CM | POA: Insufficient documentation

## 2019-10-20 DIAGNOSIS — R5381 Other malaise: Secondary | ICD-10-CM | POA: Insufficient documentation

## 2019-10-20 DIAGNOSIS — Z171 Estrogen receptor negative status [ER-]: Secondary | ICD-10-CM | POA: Insufficient documentation

## 2019-10-20 DIAGNOSIS — Z5111 Encounter for antineoplastic chemotherapy: Secondary | ICD-10-CM | POA: Insufficient documentation

## 2019-10-20 DIAGNOSIS — G4700 Insomnia, unspecified: Secondary | ICD-10-CM | POA: Insufficient documentation

## 2019-10-20 DIAGNOSIS — R197 Diarrhea, unspecified: Secondary | ICD-10-CM | POA: Insufficient documentation

## 2019-10-20 DIAGNOSIS — R5383 Other fatigue: Secondary | ICD-10-CM | POA: Insufficient documentation

## 2019-10-20 DIAGNOSIS — C7951 Secondary malignant neoplasm of bone: Secondary | ICD-10-CM | POA: Insufficient documentation

## 2019-10-20 DIAGNOSIS — G893 Neoplasm related pain (acute) (chronic): Secondary | ICD-10-CM | POA: Insufficient documentation

## 2019-10-20 DIAGNOSIS — C50912 Malignant neoplasm of unspecified site of left female breast: Secondary | ICD-10-CM | POA: Insufficient documentation

## 2019-10-20 DIAGNOSIS — R21 Rash and other nonspecific skin eruption: Secondary | ICD-10-CM | POA: Insufficient documentation

## 2019-10-20 DIAGNOSIS — M549 Dorsalgia, unspecified: Secondary | ICD-10-CM | POA: Insufficient documentation

## 2019-10-20 NOTE — Progress Notes (Signed)
Nutrition Assessment   Reason for Assessment:  Fear of eating foods that may increase risk of breast cancer.    ASSESSMENT:  39 year old female with metastatic breast cancer.  Past medical history of colitis, kyphoplasty.  Patient receiving taxol, herceptin and perjeta.    Met with patient and interpreter Jacqui.  Patient reports that she has a fair appetite.  Has a fear of eating foods that may have caused her breast cancer.  Has received advice from friend.  Typically has oatmeal, milk for breakfast and juice.  Has egg and ham for lunch and tortilla and beans for supper.  Friend mixed her a smoothie with ginger, celery and turmeric and seaweed and she vomited.  Reports some taste alterations.  Patient has stopped drinking soda and started drinking more water.    Medications: zofran, carafate, senokot, ativan, KCL, iron.     Labs: glucose 153   Anthropometrics:    Height: 60 inches Weight: 125 lb 12.8 oz on 7/30 7/9 134 lb BMI: 24  7% weight loss in the last month, significant   Estimated Energy Needs  Kcals: 1425-1700 Protein: 71-85 g Fluid: > 1.4 L   NUTRITION DIAGNOSIS: Inadequate oral intake related to fear of eating due to cancer diagnosis as evidenced by weight loss    INTERVENTION:  Reviewed current recommendations from [place] and [place] with patient regarding nutrition.  Nutrition for the Person with Cancer during treatment booklet given to patient (spanish version).   Questions answered to patient's satisfaction. Contact information provided to patient   MONITORING, EVALUATION, GOAL: weight trends, intake   Next Visit: August 30 face to face, interpreter requested.   Talli Kimmer B. Zenia Resides, [place], [place] Registered Dietitian [phone] (mobile)

## 2019-10-24 ENCOUNTER — Inpatient Hospital Stay: Payer: Self-pay

## 2019-10-24 ENCOUNTER — Inpatient Hospital Stay (HOSPITAL_BASED_OUTPATIENT_CLINIC_OR_DEPARTMENT_OTHER): Payer: Self-pay | Admitting: Internal Medicine

## 2019-10-24 ENCOUNTER — Other Ambulatory Visit: Payer: Self-pay

## 2019-10-24 ENCOUNTER — Encounter: Payer: Self-pay | Admitting: Internal Medicine

## 2019-10-24 DIAGNOSIS — C50919 Malignant neoplasm of unspecified site of unspecified female breast: Secondary | ICD-10-CM

## 2019-10-24 DIAGNOSIS — C50912 Malignant neoplasm of unspecified site of left female breast: Secondary | ICD-10-CM

## 2019-10-24 DIAGNOSIS — M8440XA Pathological fracture, unspecified site, initial encounter for fracture: Secondary | ICD-10-CM

## 2019-10-24 LAB — CBC WITH DIFFERENTIAL/PLATELET
Abs Immature Granulocytes: 0.05 10*3/uL (ref 0.00–0.07)
Basophils Absolute: 0.1 10*3/uL (ref 0.0–0.1)
Basophils Relative: 1 %
Eosinophils Absolute: 0.4 10*3/uL (ref 0.0–0.5)
Eosinophils Relative: 6 %
HCT: 32.7 % — ABNORMAL LOW (ref 36.0–46.0)
Hemoglobin: 10.7 g/dL — ABNORMAL LOW (ref 12.0–15.0)
Immature Granulocytes: 1 %
Lymphocytes Relative: 21 %
Lymphs Abs: 1.4 10*3/uL (ref 0.7–4.0)
MCH: 28.2 pg (ref 26.0–34.0)
MCHC: 32.7 g/dL (ref 30.0–36.0)
MCV: 86.3 fL (ref 80.0–100.0)
Monocytes Absolute: 0.3 10*3/uL (ref 0.1–1.0)
Monocytes Relative: 4 %
Neutro Abs: 4.4 10*3/uL (ref 1.7–7.7)
Neutrophils Relative %: 67 %
Platelets: 451 10*3/uL — ABNORMAL HIGH (ref 150–400)
RBC: 3.79 MIL/uL — ABNORMAL LOW (ref 3.87–5.11)
RDW: 14.5 % (ref 11.5–15.5)
WBC: 6.6 10*3/uL (ref 4.0–10.5)
nRBC: 0 % (ref 0.0–0.2)

## 2019-10-24 LAB — COMPREHENSIVE METABOLIC PANEL
ALT: 46 U/L — ABNORMAL HIGH (ref 0–44)
AST: 30 U/L (ref 15–41)
Albumin: 3.6 g/dL (ref 3.5–5.0)
Alkaline Phosphatase: 100 U/L (ref 38–126)
Anion gap: 10 (ref 5–15)
BUN: 8 mg/dL (ref 6–20)
CO2: 24 mmol/L (ref 22–32)
Calcium: 9.1 mg/dL (ref 8.9–10.3)
Chloride: 103 mmol/L (ref 98–111)
Creatinine, Ser: 0.53 mg/dL (ref 0.44–1.00)
GFR calc Af Amer: >60 mL/min (ref >60)
GFR calc non Af Amer: >60 mL/min (ref >60)
Glucose, Bld: 122 mg/dL — ABNORMAL HIGH (ref 70–99)
Potassium: 3.4 mmol/L — ABNORMAL LOW (ref 3.5–5.1)
Sodium: 137 mmol/L (ref 135–145)
Total Bilirubin: 0.5 mg/dL (ref 0.3–1.2)
Total Protein: 7.9 g/dL (ref 6.5–8.1)

## 2019-10-24 MED ORDER — OXYCODONE HCL ER 10 MG PO T12A: 10.0000 mg | EXTENDED_RELEASE_TABLET | Freq: Two times a day (BID) | ORAL | 0 refills | Status: DC

## 2019-10-24 MED ORDER — SODIUM CHLORIDE 0.9% FLUSH
10.0000 mL | Freq: Once | INTRAVENOUS | Status: AC
  Administered 2019-10-24: 10 mL via INTRAVENOUS
  Filled 2019-10-24: qty 10

## 2019-10-24 MED ORDER — HEPARIN SOD (PORK) LOCK FLUSH 100 UNIT/ML IV SOLN
500.0000 [IU] | Freq: Once | INTRAVENOUS | Status: AC
  Administered 2019-10-24: 500 [IU] via INTRAVENOUS
  Filled 2019-10-24: qty 5

## 2019-10-24 MED ORDER — SODIUM CHLORIDE 0.9 % IV SOLN
65.0000 mg/m2 | Freq: Once | INTRAVENOUS | Status: AC
  Administered 2019-10-24: 108 mg via INTRAVENOUS
  Filled 2019-10-24: qty 18

## 2019-10-24 MED ORDER — DIPHENHYDRAMINE HCL 50 MG/ML IJ SOLN
50.0000 mg | Freq: Once | INTRAMUSCULAR | Status: AC
  Administered 2019-10-24: 50 mg via INTRAVENOUS
  Filled 2019-10-24: qty 1

## 2019-10-24 MED ORDER — CLINDAMYCIN PHOSPHATE 1 % EX GEL
Freq: Two times a day (BID) | CUTANEOUS | 0 refills | Status: DC
Start: 2019-10-24 — End: 2020-02-10

## 2019-10-24 MED ORDER — FAMOTIDINE IN NACL 20-0.9 MG/50ML-% IV SOLN
20.0000 mg | Freq: Once | INTRAVENOUS | Status: AC
  Administered 2019-10-24: 20 mg via INTRAVENOUS
  Filled 2019-10-24: qty 50

## 2019-10-24 MED ORDER — HEPARIN SOD (PORK) LOCK FLUSH 100 UNIT/ML IV SOLN
INTRAVENOUS | Status: AC
  Filled 2019-10-24: qty 5

## 2019-10-24 MED ORDER — SODIUM CHLORIDE 0.9 % IV SOLN
Freq: Once | INTRAVENOUS | Status: AC
  Filled 2019-10-24: qty 250

## 2019-10-24 MED ORDER — SODIUM CHLORIDE 0.9 % IV SOLN
20.0000 mg | Freq: Once | INTRAVENOUS | Status: AC
  Administered 2019-10-24: 20 mg via INTRAVENOUS
  Filled 2019-10-24: qty 20

## 2019-10-24 NOTE — Progress Notes (Signed)
[place] NOTE  Patient Care Team: [facility], [place], NP as PCP - General Rico Junker, RN as Registered Nurse Theodore Demark, RN as Registered Nurse  CHIEF COMPLAINTS/PURPOSE OF CONSULTATION:  Breast cancer  #  Oncology History  Metastatic breast cancer ([place])  08/29/2019 Initial Diagnosis   Metastatic breast cancer ([place])   09/04/2019 -  Chemotherapy   The patient had trastuzumab (HERCEPTIN) 450 mg in sodium chloride 0.9 % 250 mL chemo infusion, 483 mg (100 % of original dose 8 mg/kg), Intravenous,  Once, 3 of 6 cycles Dose modification: 8 mg/kg (original dose 8 mg/kg, Cycle 1, Reason: Other (see comments), Comment: loading dose), 6 mg/kg (original dose 6 mg/kg, Cycle 2, Reason: Other (see comments), Comment: per md) Administration: 450 mg (09/04/2019), 357 mg (09/26/2019), 357 mg (10/17/2019) PACLitaxel (TAXOL) 132 mg in sodium chloride 0.9 % 250 mL chemo infusion (</= '80mg'$ /m2), 80 mg/m2 = 132 mg, Intravenous,  Once, 3 of 6 cycles Dose modification: 65 mg/m2 (original dose 80 mg/m2, Cycle 3, Reason: Dose not tolerated) Administration: 132 mg (09/11/2019), 132 mg (09/26/2019), 108 mg (10/17/2019), 132 mg (09/19/2019) pertuzumab (PERJETA) 840 mg in sodium chloride 0.9 % 250 mL chemo infusion, 840 mg, Intravenous, Once, 3 of 6 cycles Administration: 840 mg (09/04/2019), 420 mg (09/26/2019), 420 mg (10/17/2019) trastuzumab-dkst (OGIVRI) 483 mg in sodium chloride 0.9 % 250 mL chemo infusion, 8 mg/kg = 483 mg, Intravenous,  Once, 1 of 1 cycle  for chemotherapy treatment.       HISTORY OF PRESENTING ILLNESS:  Sophia [name] 39 y.o.  female history of metastatic ER negative HER-2/neu positive breast cancer is currently here for follow-up.  In the interim patient noted to have acneform rash on her chin.  Mildly itchy.  No rash anywhere else.   No diarrhea.  No abdominal pain.  Continues to have mild hip pain; continues to take OxyContin.  Oxycodone only as  needed.  She is today ambulating independently.  No tingling or numbness.   Review of Systems  Constitutional: Positive for weight loss. Negative for chills, diaphoresis, fever and malaise/fatigue.  HENT: Negative for nosebleeds and sore throat.   Eyes: Negative for double vision.  Respiratory: Negative for cough, hemoptysis, sputum production, shortness of breath and wheezing.   Cardiovascular: Negative for chest pain, palpitations, orthopnea and leg swelling.  Gastrointestinal: Positive for diarrhea. Negative for abdominal pain, blood in stool, constipation, heartburn, melena, nausea and vomiting.  Genitourinary: Negative for dysuria, frequency and urgency.  Musculoskeletal: Positive for back pain and joint pain.  Skin: Negative.  Negative for itching and rash.  Neurological: Negative for dizziness, tingling, focal weakness, weakness and headaches.  Endo/Heme/Allergies: Does not bruise/bleed easily.  Psychiatric/Behavioral: Negative for depression. The patient is not nervous/anxious and does not have insomnia.      MEDICAL HISTORY:  Past Medical History:  Diagnosis Date  . Anxiety   . Breast cancer ([place])    with mets  . Cancer ([place])   . Colitis   . Vertigo     SURGICAL HISTORY: Past Surgical History:  Procedure Laterality Date  . BREAST BIOPSY Left 08/14/2019   [place] bx of mass, coil marker, path pending  . BREAST BIOPSY Left 08/14/2019   [place] bx of LN, hydromarker, path pending  . BREAST BIOPSY Left 08/14/2019   affirm bx of calcs, x marker, path pending  . ESOPHAGOGASTRODUODENOSCOPY (EGD) WITH PROPOFOL N/A 10/05/2019   Procedure: ESOPHAGOGASTRODUODENOSCOPY (EGD) WITH PROPOFOL;  Surgeon: Lin Landsman,  MD;  Location: [facility] ENDOSCOPY;  Service: Gastroenterology;  Laterality: N/A;  . FLEXIBLE SIGMOIDOSCOPY N/A 10/05/2019   Procedure: FLEXIBLE SIGMOIDOSCOPY;  Surgeon: Lin Landsman, MD;  Location: [facility] ENDOSCOPY;  Service: Gastroenterology;  Laterality: N/A;  .  INTRAMEDULLARY (IM) NAIL INTERTROCHANTERIC Right 09/01/2019   Procedure: INTRAMEDULLARY (IM) NAIL INTERTROCHANTRIC AND RADIOFREQUENCY ABLATION;  Surgeon: Hessie Knows, MD;  Location: [facility] ORS;  Service: Orthopedics;  Laterality: Right;  . KYPHOPLASTY N/A 08/29/2019   Procedure: KYPHOPLASTY T6, L1,L4 ,  RADIOFREQUENCY ABLATION;  Surgeon: Hessie Knows, MD;  Location: [facility] ORS;  Service: Orthopedics;  Laterality: N/A;  . KYPHOPLASTY Right 09/01/2019   Procedure: Right Sacral Radiofrequency Ablation and Cement Augmentation, Right sacrum and iliac crest;  Surgeon: Hessie Knows, MD;  Location: [facility] ORS;  Service: Orthopedics;  Laterality: Right;  . PORTA CATH INSERTION N/A 08/28/2019   Procedure: PORTA CATH INSERTION;  Surgeon: Algernon Huxley, MD;  Location: [place] CV LAB;  Service: Cardiovascular;  Laterality: N/A;    SOCIAL HISTORY: Social History   Socioeconomic History  . Marital status: Single    Spouse name: Not on file  . Number of children: Not on file  . Years of education: Not on file  . Highest education level: Not on file  Occupational History  . Not on file  Tobacco Use  . Smoking status: Never Smoker  . Smokeless tobacco: Never Used  Vaping Use  . Vaping Use: Never used  Substance and Sexual Activity  . Alcohol use: Not Currently  . Drug use: Not Currently  . Sexual activity: Not Currently    Birth control/protection: None  Other Topics Concern  . Not on file  Social History Narrative   Lives at home with children   Social Determinants of Health   Financial Resource Strain:   . Difficulty of Paying Living Expenses:   Food Insecurity:   . Worried About Charity fundraiser in the Last Year:   . Arboriculturist in the Last Year:   Transportation Needs:   . Film/video editor (Medical):   Marland Kitchen Lack of Transportation (Non-Medical):   Physical Activity:   . Days of Exercise per Week:   . Minutes of Exercise per Session:   Stress:   . Feeling of Stress :    Social Connections:   . Frequency of Communication with Friends and Family:   . Frequency of Social Gatherings with Friends and Family:   . Attends Religious Services:   . Active Member of Clubs or Organizations:   . Attends Archivist Meetings:   Marland Kitchen Marital Status:   Intimate Partner Violence:   . Fear of Current or Ex-Partner:   . Emotionally Abused:   Marland Kitchen Physically Abused:   . Sexually Abused:     FAMILY HISTORY: Family History  Problem Relation Age of Onset  . Colon cancer Maternal Uncle     ALLERGIES:  has No Known Allergies.  MEDICATIONS:  Current Outpatient Medications  Medication Sig Dispense Refill  . diphenoxylate-atropine (LOMOTIL) 2.5-0.025 MG tablet Take 1 tablet by mouth 4 (four) times daily as needed for diarrhea or loose stools. 30 tablet 0  . gabapentin (NEURONTIN) 300 MG capsule Take 1 capsule (300 mg total) by mouth 2 (two) times daily. 60 capsule 1  . lidocaine-prilocaine (EMLA) cream Apply 1 application topically as needed. Apply small amount to port site at least 1 hour prior to it being accessed, cover with plastic wrap 30 g 1  . LORazepam (ATIVAN)  0.5 MG tablet Take 1 tablet (0.5 mg total) by mouth every 6 (six) hours as needed for anxiety. 30 tablet 0  . oxyCODONE (OXY IR/ROXICODONE) 5 MG immediate release tablet Take 1-2 tablets (5-10 mg total) by mouth every 4 (four) hours as needed for moderate pain. 120 tablet 0  . oxyCODONE (OXYCONTIN) 10 mg 12 hr tablet Take 1 tablet (10 mg total) by mouth every 12 (twelve) hours. 60 tablet 0  . potassium chloride SA (KLOR-CON) 20 MEQ tablet Take 1 tablet (20 mEq total) by mouth daily. 7 tablet 0  . clindamycin (CLINDAGEL) 1 % gel Apply topically 2 (two) times daily. X 7 days. 30 g 0  . ferrous sulfate 325 (65 FE) MG tablet Take 1 tablet (325 mg total) by mouth daily with breakfast. (Patient not taking: Reported on 10/17/2019) 30 tablet 0  . hydrOXYzine (ATARAX/VISTARIL) 10 MG tablet Take 1 tablet (10 mg  total) by mouth 3 (three) times daily as needed for anxiety. (Patient not taking: Reported on 10/10/2019) 30 tablet 0  . ondansetron (ZOFRAN) 4 MG tablet Take 1 tablet (4 mg total) by mouth every 6 (six) hours as needed for nausea or vomiting. (Patient not taking: Reported on 09/10/2019) 60 tablet 0  . pantoprazole (PROTONIX) 40 MG tablet Take 1 tablet (40 mg total) by mouth daily. (Patient not taking: Reported on 10/10/2019) 30 tablet 1  . senna-docusate (SENOKOT-S) 8.6-50 MG tablet Take 1 tablet by mouth 2 (two) times daily. (Patient not taking: Reported on 10/10/2019) 30 tablet 1  . sucralfate (CARAFATE) 1 g tablet Take 1 tablet (1 g total) by mouth 4 (four) times daily -  with meals and at bedtime for 14 days. (Patient not taking: Reported on 10/17/2019) 56 tablet 0   No current facility-administered medications for this visit.   Facility-Administered Medications Ordered in Other Visits  Medication Dose Route Frequency Provider Last Rate Last Admin  . heparin lock flush 100 unit/mL  500 Units Intravenous Once Sindy Guadeloupe, MD      . PACLitaxel (TAXOL) 108 mg in sodium chloride 0.9 % 250 mL chemo infusion (</= '80mg'$ /m2)  65 mg/m2 (Treatment Plan Recorded) Intravenous Once Sindy Guadeloupe, MD 268 mL/hr at 10/24/19 1125 108 mg at 10/24/19 1125      .  PHYSICAL EXAMINATION: ECOG PERFORMANCE STATUS: 1 - Symptomatic but completely ambulatory  Vitals:   10/24/19 0953  BP: 113/83  Pulse: 92  Resp: 16  Temp: 98.8 F (37.1 C)  SpO2: 99%   Filed Weights   10/24/19 0953  Weight: 126 lb (57.2 kg)    Physical Exam Constitutional:      Comments: Patient ambulating independently.   HENT:     Head: Normocephalic and atraumatic.     Mouth/Throat:     Pharynx: No oropharyngeal exudate.  Eyes:     Pupils: Pupils are equal, round, and reactive to light.  Cardiovascular:     Rate and Rhythm: Normal rate and regular rhythm.  Pulmonary:     Effort: Pulmonary effort is normal. No respiratory  distress.     Breath sounds: Normal breath sounds. No wheezing.  Abdominal:     General: Bowel sounds are normal. There is no distension.     Palpations: Abdomen is soft. There is no mass.     Tenderness: There is no abdominal tenderness. There is no guarding or rebound.  Musculoskeletal:        General: No tenderness. Normal range of motion.     Cervical  back: Normal range of motion and neck supple.  Skin:    General: Skin is warm.     Comments: Acneiform rash noted on the chin.   Neurological:     Mental Status: She is alert and oriented to person, place, and time.  Psychiatric:        Mood and Affect: Affect normal.      LABORATORY DATA:  I have reviewed the data as listed Lab Results  Component Value Date   WBC 6.6 10/24/2019   HGB 10.7 (L) 10/24/2019   HCT 32.7 (L) 10/24/2019   MCV 86.3 10/24/2019   PLT 451 (H) 10/24/2019   Recent Labs    10/10/19 0748 10/17/19 0900 10/24/19 0921  NA 137 135 137  K 3.0* 4.0 3.4*  CL 105 102 103  CO2 '23 25 24  '$ GLUCOSE 120* 153* 122*  BUN <5* 8 8  CREATININE 0.35* 0.56 0.53  CALCIUM 8.6* 8.9 9.1  GFRNONAA >60 >60 >60  GFRAA >60 >60 >60  PROT 7.0 7.8 7.9  ALBUMIN 3.4* 3.5 3.6  AST 40 35 30  ALT 46* 40 46*  ALKPHOS 116 109 100  BILITOT 0.7 1.0 0.5    RADIOGRAPHIC STUDIES: I have personally reviewed the radiological images as listed and agreed with the findings in the report. CT ABDOMEN PELVIS W CONTRAST  Result Date: 10/01/2019 CLINICAL DATA:  History of metastatic breast cancer. Abdominal pain for 4 days. EXAM: CT ABDOMEN AND PELVIS WITH CONTRAST TECHNIQUE: Multidetector CT imaging of the abdomen and pelvis was performed using the standard protocol following bolus administration of intravenous contrast. CONTRAST:  55m OMNIPAQUE IOHEXOL 300 MG/ML  SOLN COMPARISON:  None. FINDINGS: Lower chest: Streaky basilar atelectasis but no infiltrates or effusions. The heart is normal in size. No pericardial effusion. No worrisome  pulmonary nodules. Hepatobiliary: No worrisome hepatic lesions to suggest metastatic disease. The gallbladder is normal. No common bile duct dilatation. Pancreas: No mass, inflammation or ductal dilatation. Spleen: Normal size. No focal lesions. Adrenals/Urinary Tract: The adrenal glands and kidneys are unremarkable. No renal lesions or hydronephrosis. The bladder is unremarkable. Stomach/Bowel: The stomach is unremarkable. The duodenum and small bowel are unremarkable without oral contrast. No acute inflammatory changes, mass lesions or obstructive findings. Moderate inflammatory or infectious process involving the cecum and part of the ascending colon. There may be some mild associated secondary inflammation of the terminal ileum. The appendix is also secondarily inflamed with mild mucosal enhancement. There is also a small appendicoliths but I do not see any findings suspicious for acute appendicitis. The remainder of the colon is unremarkable. Scattered diverticulosis is noted. Vascular/Lymphatic: The aorta and branch vessels are patent. The major venous structures are patent. No mesenteric or retroperitoneal mass or lymphadenopathy. Reproductive: The uterus and ovaries are unremarkable. Other: No significant free abdominal or free pelvic fluid collections. No abdominal wall hernia or subcutaneous lesions. Musculoskeletal: Findings of diffuse osseous metastatic disease. Vertebral augmentation changes are noted at T6, L1 and L4. Right-sided sacral plasty also noted along with bone cement in the right iliac bone. Right hip hardware is noted. Lytic lesion noted in the L2 vertebral body. No spinal canal encroachment. There is a large amount of bone cement noted in the spinal canal at L4. IMPRESSION: 1. Moderate inflammatory or infectious process involving the cecum and part of the ascending colon with mild associated secondary inflammation of the terminal ileum. 2. The appendix is also secondarily inflamed with mild  mucosal enhancement. There is also a small appendicoliths  but no findings suspicious for acute appendicitis. 3. Findings of diffuse osseous metastatic disease as detailed above. Electronically Signed   By: Marijo Sanes M.D.   On: 10/01/2019 08:19   DG Chest Portable 1 View  Result Date: 10/01/2019 CLINICAL DATA:  Port-A-Cath placement. EXAM: PORTABLE CHEST 1 VIEW COMPARISON:  None. FINDINGS: Right IJ power port is in good position with its tip in the distal SVC near the cavoatrial junction. No complicating features are identified. The cardiac silhouette, mediastinal and hilar contours are within normal limits given the AP projection and portable technique. Low lung volumes with vascular crowding and streaky basilar atelectasis but no infiltrates or effusions. No pneumothorax. The bony thorax is intact. Vertebral augmentation changes are noted. IMPRESSION: Right IJ power port in good position without complicating features. Electronically Signed   By: Marijo Sanes M.D.   On: 10/01/2019 07:12    ASSESSMENT & PLAN:   Primary cancer of left breast with metastasis to other site [facility]) # Breast cancer-metastatic HER-2/neu positive-currently on Taxol Herceptin Perjeta first-line.  Tolerating chemotherapy with moderate to severe side effects-see below [recent acute colitis]  #Proceed with Taxol alone today.  Taxol by 20%-given recent acute colitis.  Today. Labs today reviewed;  acceptable for treatment today.   # acute desceding colitis--question etiology; resolved; not needing any anti-diarrheals.   #Bone mets-s/p radiation [finished July 15] bone pain- on nacrotics-STABLE; awaiting X-geva next week.   # Acne like rash- on the chin- recommend clindamycin  Topical.   DISPOSITION: # chemo today; HOLD ZOMETA # with Dr.Rao-as planned-[ 1 week- MD appt; labs/chemo-Taxol] ADD Jayme Cloud; - Dr.B  All questions were answered. The patient knows to call the clinic with any problems, questions or concerns.     Cammie Sickle, MD 10/24/2019 12:20 PM

## 2019-10-24 NOTE — Assessment & Plan Note (Addendum)
#  Breast cancer-metastatic HER-2/neu positive-currently on Taxol Herceptin Perjeta first-line.  Tolerating chemotherapy with moderate to severe side effects-see below [recent acute colitis]  #Proceed with Taxol alone today.  Taxol by 20%-given recent acute colitis.  Today. Labs today reviewed;  acceptable for treatment today.   # acute desceding colitis--question etiology; resolved; not needing any anti-diarrheals.   #Bone mets-s/p radiation [finished July 15] bone pain- on nacrotics-STABLE; awaiting X-geva next week.   # Acne like rash- on the chin- recommend clindamycin  Topical.   DISPOSITION: # chemo today; HOLD ZOMETA # with Dr.Rao-as planned-[ 1 week- MD appt; labs/chemo-Taxol] ADD Jayme Cloud; - Dr.B

## 2019-10-31 ENCOUNTER — Inpatient Hospital Stay: Payer: Self-pay

## 2019-10-31 ENCOUNTER — Other Ambulatory Visit: Payer: Self-pay | Admitting: *Deleted

## 2019-10-31 ENCOUNTER — Other Ambulatory Visit: Payer: Self-pay

## 2019-10-31 ENCOUNTER — Encounter: Payer: Self-pay | Admitting: Oncology

## 2019-10-31 ENCOUNTER — Inpatient Hospital Stay (HOSPITAL_BASED_OUTPATIENT_CLINIC_OR_DEPARTMENT_OTHER): Payer: Self-pay | Admitting: Oncology

## 2019-10-31 VITALS — BP 110/81 | HR 83 | Temp 96.0°F | Resp 18 | Wt 124.6 lb

## 2019-10-31 VITALS — BP 110/75 | HR 80 | Resp 16

## 2019-10-31 DIAGNOSIS — C7951 Secondary malignant neoplasm of bone: Secondary | ICD-10-CM

## 2019-10-31 DIAGNOSIS — G893 Neoplasm related pain (acute) (chronic): Secondary | ICD-10-CM

## 2019-10-31 DIAGNOSIS — M8440XA Pathological fracture, unspecified site, initial encounter for fracture: Secondary | ICD-10-CM

## 2019-10-31 DIAGNOSIS — Z5111 Encounter for antineoplastic chemotherapy: Secondary | ICD-10-CM

## 2019-10-31 DIAGNOSIS — C50919 Malignant neoplasm of unspecified site of unspecified female breast: Secondary | ICD-10-CM

## 2019-10-31 DIAGNOSIS — C50912 Malignant neoplasm of unspecified site of left female breast: Secondary | ICD-10-CM

## 2019-10-31 LAB — CBC WITH DIFFERENTIAL/PLATELET
Abs Immature Granulocytes: 0.02 10*3/uL (ref 0.00–0.07)
Basophils Absolute: 0 10*3/uL (ref 0.0–0.1)
Basophils Relative: 1 %
Eosinophils Absolute: 0.2 10*3/uL (ref 0.0–0.5)
Eosinophils Relative: 5 %
HCT: 32.8 % — ABNORMAL LOW (ref 36.0–46.0)
Hemoglobin: 10.6 g/dL — ABNORMAL LOW (ref 12.0–15.0)
Immature Granulocytes: 0 %
Lymphocytes Relative: 25 %
Lymphs Abs: 1.3 10*3/uL (ref 0.7–4.0)
MCH: 28.2 pg (ref 26.0–34.0)
MCHC: 32.3 g/dL (ref 30.0–36.0)
MCV: 87.2 fL (ref 80.0–100.0)
Monocytes Absolute: 0.3 10*3/uL (ref 0.1–1.0)
Monocytes Relative: 6 %
Neutro Abs: 3.3 10*3/uL (ref 1.7–7.7)
Neutrophils Relative %: 63 %
Platelets: 439 10*3/uL — ABNORMAL HIGH (ref 150–400)
RBC: 3.76 MIL/uL — ABNORMAL LOW (ref 3.87–5.11)
RDW: 15 % (ref 11.5–15.5)
WBC: 5.3 10*3/uL (ref 4.0–10.5)
nRBC: 0 % (ref 0.0–0.2)

## 2019-10-31 LAB — COMPREHENSIVE METABOLIC PANEL
ALT: 27 U/L (ref 0–44)
AST: 32 U/L (ref 15–41)
Albumin: 3.4 g/dL — ABNORMAL LOW (ref 3.5–5.0)
Alkaline Phosphatase: 101 U/L (ref 38–126)
Anion gap: 12 (ref 5–15)
BUN: 8 mg/dL (ref 6–20)
CO2: 23 mmol/L (ref 22–32)
Calcium: 9 mg/dL (ref 8.9–10.3)
Chloride: 102 mmol/L (ref 98–111)
Creatinine, Ser: 0.61 mg/dL (ref 0.44–1.00)
GFR calc Af Amer: >60 mL/min (ref >60)
GFR calc non Af Amer: >60 mL/min (ref >60)
Glucose, Bld: 146 mg/dL — ABNORMAL HIGH (ref 70–99)
Potassium: 3.1 mmol/L — ABNORMAL LOW (ref 3.5–5.1)
Sodium: 137 mmol/L (ref 135–145)
Total Bilirubin: 0.5 mg/dL (ref 0.3–1.2)
Total Protein: 7.3 g/dL (ref 6.5–8.1)

## 2019-10-31 MED ORDER — SODIUM CHLORIDE 0.9 % IV SOLN
65.0000 mg/m2 | Freq: Once | INTRAVENOUS | Status: AC
  Administered 2019-10-31: 108 mg via INTRAVENOUS
  Filled 2019-10-31: qty 18

## 2019-10-31 MED ORDER — HEPARIN SOD (PORK) LOCK FLUSH 100 UNIT/ML IV SOLN
INTRAVENOUS | Status: AC
  Filled 2019-10-31: qty 5

## 2019-10-31 MED ORDER — HEPARIN SOD (PORK) LOCK FLUSH 100 UNIT/ML IV SOLN
500.0000 [IU] | Freq: Once | INTRAVENOUS | Status: AC
  Administered 2019-10-31: 500 [IU] via INTRAVENOUS
  Filled 2019-10-31: qty 5

## 2019-10-31 MED ORDER — FAMOTIDINE IN NACL 20-0.9 MG/50ML-% IV SOLN
20.0000 mg | Freq: Once | INTRAVENOUS | Status: AC
  Administered 2019-10-31: 20 mg via INTRAVENOUS
  Filled 2019-10-31: qty 50

## 2019-10-31 MED ORDER — SODIUM CHLORIDE 0.9 % IV SOLN
20.0000 mg | Freq: Once | INTRAVENOUS | Status: AC
  Administered 2019-10-31: 20 mg via INTRAVENOUS
  Filled 2019-10-31: qty 20

## 2019-10-31 MED ORDER — DIPHENHYDRAMINE HCL 50 MG/ML IJ SOLN
50.0000 mg | Freq: Once | INTRAMUSCULAR | Status: AC
  Administered 2019-10-31: 50 mg via INTRAVENOUS
  Filled 2019-10-31: qty 1

## 2019-10-31 MED ORDER — SODIUM CHLORIDE 0.9% FLUSH
10.0000 mL | INTRAVENOUS | Status: DC | PRN
  Administered 2019-10-31: 10 mL via INTRAVENOUS
  Filled 2019-10-31: qty 10

## 2019-10-31 MED ORDER — SODIUM CHLORIDE 0.9 % IV SOLN
Freq: Once | INTRAVENOUS | Status: AC
  Filled 2019-10-31: qty 250

## 2019-10-31 MED ORDER — DENOSUMAB 120 MG/1.7ML ~~LOC~~ SOLN
120.0000 mg | Freq: Once | SUBCUTANEOUS | Status: AC
  Administered 2019-10-31: 120 mg via SUBCUTANEOUS
  Filled 2019-10-31: qty 1.7

## 2019-10-31 MED ORDER — POTASSIUM CHLORIDE CRYS ER 20 MEQ PO TBCR: 20.0000 meq | EXTENDED_RELEASE_TABLET | Freq: Every day | ORAL | 0 refills | Status: DC

## 2019-10-31 NOTE — Progress Notes (Signed)
Hematology/Oncology Consult note [facility]  [phone] [phone]  Patient Care Team: Center, [place], NP as PCP - General Rico Junker, RN as Registered Nurse Theodore Demark, RN as Registered Nurse   Name of the patient: Sophia [name]  [id number]  [birth year]   Date of visit: 10/31/19  Diagnosis- stage IV metastatic breast cancer ER/PR negative HER-2/neu positive with bone metastases  Chief complaint/ Reason for visit-on treatment assessment prior to cycle 3-day 15 of weekly Taxol chemotherapy  Heme/Onc history: patient is a 39 year old Hispanic female who is here with her friend. History obtained with the help of an interpreter.Patient self palpated left breast mass which was followed by a diagnostic bilateral mammogram. Mammogram showed 3.1 x 2.9 x 1.9 cm hypoechoic mass at the 1 o'clock position of the left breast. For abnormal cortically thickened left axillary lymph nodes measuring up to 5 mm. 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 Patient was also having ongoing back pain and was seen by [facility] orthopedics Dr. Doyle Askew who ordered MRI lumbar spine without contrast which showed possible pathologic fractures of L1 and L4 vertebral bodies with greater than 50% height loss at L1 and abnormal signal involving L2-L3 S1 as well as right iliac bone concerning for metastatic disease.Patient is a single mother of 3 adult children and is very anxious today.She reports significant back pain which radiates to her bilateral thighs. Denies any focal tingling numbness or weakness. Denies any bowel bladder incontinence. Pain has been uncontrolled despite taking Tylenol. No prior history of abnormal breast biopsies. No family history of breast cancer  PET and MRIshowed 3 areas of pathologic fracture of  her spine as well as widespread bony metastatic disease and concern for impending fracture of the right hip. Given her worsening pain she was asked to come to the ER. She has been evaluated by Dr. Rudene Christians from orthopedic surgery and underwent kyphoplasty at 3 different levels.T6 L1 and L4 along with radiofrequency ablation.She also underwent prophylactic fixation of the right hip and not affected the sacral region.   Patient received first dose of Herceptin and Perjeta on 09/04/2019. Baseline echocardiogram normal.she is currently getting taxol/ herceptin/perjeta  Patient admitted to hospital for acute abdominal pain with CT findings concerning for acute colitis.  Colonoscopy showed diffuse severe inflammation with erythema friability and loss of vascularity and shallow ulcerations in the IC valve, ascending colon and cecum.    Interval history-history obtained with the help of Spanish interpreter.  Reports no abdominal pain or diarrhea.  Bowel movements are regular.  Appetite and weight have remained stable.  She has occasional sharp shooting pain in her back which lasts for a few seconds and goes away.  She is using OxyContin twice a day but has not required any as needed oxycodone.  ECOG PS- 1 Pain scale- 0 Opioid associated constipation- no  Review of systems- Review of Systems  Constitutional: Positive for malaise/fatigue. Negative for chills, fever and weight loss.  HENT: Negative for congestion, ear discharge and nosebleeds.   Eyes: Negative for blurred vision.  Respiratory: Negative for cough, hemoptysis, sputum production, shortness of breath and wheezing.   Cardiovascular: Negative for chest pain, palpitations, orthopnea and claudication.  Gastrointestinal: Negative for abdominal pain, blood in stool, constipation, diarrhea, heartburn, melena, nausea and vomiting.  Genitourinary: Negative for dysuria, flank pain, frequency, hematuria  and urgency.  Musculoskeletal: Negative  for back pain, joint pain and myalgias.  Skin: Negative for rash.  Neurological: Negative for dizziness, tingling, focal weakness, seizures, weakness and headaches.  Endo/Heme/Allergies: Does not bruise/bleed easily.  Psychiatric/Behavioral: Negative for depression and suicidal ideas. The patient does not have insomnia.       No Known Allergies   Past Medical History:  Diagnosis Date   Anxiety    Breast cancer (HCC)    with mets   Cancer (HCC)    Colitis    Vertigo      Past Surgical History:  Procedure Laterality Date   BREAST BIOPSY Left 08/14/2019   [place] bx of mass, coil marker, path pending   BREAST BIOPSY Left 08/14/2019   [place] bx of LN, hydromarker, path pending   BREAST BIOPSY Left 08/14/2019   affirm bx of calcs, x marker, path pending   ESOPHAGOGASTRODUODENOSCOPY (EGD) WITH PROPOFOL N/A 10/05/2019   Procedure: ESOPHAGOGASTRODUODENOSCOPY (EGD) WITH PROPOFOL;  Surgeon: Toney Reil, MD;  Location: [facility] ENDOSCOPY;  Service: Gastroenterology;  Laterality: N/A;   FLEXIBLE SIGMOIDOSCOPY N/A 10/05/2019   Procedure: FLEXIBLE SIGMOIDOSCOPY;  Surgeon: Toney Reil, MD;  Location: [facility] ENDOSCOPY;  Service: Gastroenterology;  Laterality: N/A;   INTRAMEDULLARY (IM) NAIL INTERTROCHANTERIC Right 09/01/2019   Procedure: INTRAMEDULLARY (IM) NAIL INTERTROCHANTRIC AND RADIOFREQUENCY ABLATION;  Surgeon: Kennedy Bucker, MD;  Location: [facility] ORS;  Service: Orthopedics;  Laterality: Right;   KYPHOPLASTY N/A 08/29/2019   Procedure: KYPHOPLASTY T6, L1,L4 ,  RADIOFREQUENCY ABLATION;  Surgeon: Kennedy Bucker, MD;  Location: [facility] ORS;  Service: Orthopedics;  Laterality: N/A;   KYPHOPLASTY Right 09/01/2019   Procedure: Right Sacral Radiofrequency Ablation and Cement Augmentation, Right sacrum and iliac crest;  Surgeon: Kennedy Bucker, MD;  Location: [facility] ORS;  Service: Orthopedics;  Laterality: Right;   PORTA CATH INSERTION N/A 08/28/2019   Procedure: PORTA CATH INSERTION;   Surgeon: Annice Needy, MD;  Location: [facility] INVASIVE CV LAB;  Service: Cardiovascular;  Laterality: N/A;    Social History   Socioeconomic History   Marital status: Single    Spouse name: Not on file   Number of children: Not on file   Years of education: Not on file   Highest education level: Not on file  Occupational History   Not on file  Tobacco Use   Smoking status: Never Smoker   Smokeless tobacco: Never Used  Vaping Use   Vaping Use: Never used  Substance and Sexual Activity   Alcohol use: Not Currently   Drug use: Not Currently   Sexual activity: Not Currently    Birth control/protection: None  Other Topics Concern   Not on file  Social History Narrative   Lives at home with children   Social Determinants of Health   Financial Resource Strain:    Difficulty of Paying Living Expenses:   Food Insecurity:    Worried About Programme researcher, broadcasting/film/video in the Last Year:    Barista in the Last Year:   Transportation Needs:    Freight forwarder (Medical):    Lack of Transportation (Non-Medical):   Physical Activity:    Days of Exercise per Week:    Minutes of Exercise per Session:   Stress:    Feeling of Stress :   Social Connections:    Frequency of Communication with Friends and Family:    Frequency of Social Gatherings with Friends and Family:    Attends Religious Services:    Active Member of Clubs  or Organizations:    Attends Music therapist:    Marital Status:   Intimate Partner Violence:    Fear of Current or Ex-Partner:    Emotionally Abused:    Physically Abused:    Sexually Abused:     Family History  Problem Relation Age of Onset   Colon cancer Maternal Uncle      Current Outpatient Medications:    clindamycin (CLINDAGEL) 1 % gel, Apply topically 2 (two) times daily. X 7 days., Disp: 30 g, Rfl: 0   diphenoxylate-atropine (LOMOTIL) 2.5-0.025 MG tablet, Take 1 tablet by mouth 4 (four) times  daily as needed for diarrhea or loose stools., Disp: 30 tablet, Rfl: 0   ferrous sulfate 325 (65 FE) MG tablet, Take 1 tablet (325 mg total) by mouth daily with breakfast. (Patient not taking: Reported on 10/17/2019), Disp: 30 tablet, Rfl: 0   gabapentin (NEURONTIN) 300 MG capsule, Take 1 capsule (300 mg total) by mouth 2 (two) times daily., Disp: 60 capsule, Rfl: 1   hydrOXYzine (ATARAX/VISTARIL) 10 MG tablet, Take 1 tablet (10 mg total) by mouth 3 (three) times daily as needed for anxiety. (Patient not taking: Reported on 10/10/2019), Disp: 30 tablet, Rfl: 0   lidocaine-prilocaine (EMLA) cream, Apply 1 application topically as needed. Apply small amount to port site at least 1 hour prior to it being accessed, cover with plastic wrap, Disp: 30 g, Rfl: 1   LORazepam (ATIVAN) 0.5 MG tablet, Take 1 tablet (0.5 mg total) by mouth every 6 (six) hours as needed for anxiety., Disp: 30 tablet, Rfl: 0   ondansetron (ZOFRAN) 4 MG tablet, Take 1 tablet (4 mg total) by mouth every 6 (six) hours as needed for nausea or vomiting. (Patient not taking: Reported on 09/10/2019), Disp: 60 tablet, Rfl: 0   oxyCODONE (OXY IR/ROXICODONE) 5 MG immediate release tablet, Take 1-2 tablets (5-10 mg total) by mouth every 4 (four) hours as needed for moderate pain., Disp: 120 tablet, Rfl: 0   oxyCODONE (OXYCONTIN) 10 mg 12 hr tablet, Take 1 tablet (10 mg total) by mouth every 12 (twelve) hours., Disp: 60 tablet, Rfl: 0   pantoprazole (PROTONIX) 40 MG tablet, Take 1 tablet (40 mg total) by mouth daily. (Patient not taking: Reported on 10/10/2019), Disp: 30 tablet, Rfl: 1   potassium chloride SA (KLOR-CON) 20 MEQ tablet, Take 1 tablet (20 mEq total) by mouth daily., Disp: 7 tablet, Rfl: 0   senna-docusate (SENOKOT-S) 8.6-50 MG tablet, Take 1 tablet by mouth 2 (two) times daily. (Patient not taking: Reported on 10/10/2019), Disp: 30 tablet, Rfl: 1   sucralfate (CARAFATE) 1 g tablet, Take 1 tablet (1 g total) by mouth 4 (four)  times daily -  with meals and at bedtime for 14 days. (Patient not taking: Reported on 10/17/2019), Disp: 56 tablet, Rfl: 0 No current facility-administered medications for this visit.  Facility-Administered Medications Ordered in Other Visits:    heparin lock flush 100 unit/mL, 500 Units, Intravenous, Once, Sindy Guadeloupe, MD   sodium chloride flush (NS) 0.9 % injection 10 mL, 10 mL, Intravenous, PRN, Sindy Guadeloupe, MD  Physical exam:  Vitals:   10/31/19 0926  BP: 110/81  Pulse: 83  Resp: 18  Temp: (!) 96 F (35.6 C)  TempSrc: Tympanic  SpO2: 100%  Weight: 124 lb 9.6 oz (56.5 kg)   Physical Exam Pulmonary:     Effort: Pulmonary effort is normal.  Abdominal:     General: Bowel sounds are normal.  Palpations: Abdomen is soft.  Skin:    General: Skin is warm and dry.  Neurological:     Mental Status: She is alert and oriented to person, place, and time.      CMP Latest Ref Rng & Units 10/24/2019  Glucose 70 - 99 mg/dL 122(H)  BUN 6 - 20 mg/dL 8  Creatinine 0.44 - 1.00 mg/dL 0.53  Sodium 135 - 145 mmol/L 137  Potassium 3.5 - 5.1 mmol/L 3.4(L)  Chloride 98 - 111 mmol/L 103  CO2 22 - 32 mmol/L 24  Calcium 8.9 - 10.3 mg/dL 9.1  Total Protein 6.5 - 8.1 g/dL 7.9  Total Bilirubin 0.3 - 1.2 mg/dL 0.5  Alkaline Phos 38 - 126 U/L 100  AST 15 - 41 U/L 30  ALT 0 - 44 U/L 46(H)   CBC Latest Ref Rng & Units 10/24/2019  WBC 4.0 - 10.5 K/uL 6.6  Hemoglobin 12.0 - 15.0 g/dL 10.7(L)  Hematocrit 36 - 46 % 32.7(L)  Platelets 150 - 400 K/uL 451(H)     Assessment and plan- Patient is a 39 y.o. female with metastatic ER negative her 2 positive breast cancer on taxol/herceptin and perjeta.    She is here for on treatment assessment prior to cycle 3-day fifteen of Taxol  Patient has been receiving Taxol at reduced dose of 65 mg per metered squared after she had an episode of acute colitis of uncertain etiology.  Counts are okay to proceed with cycle 3-day 15 of Taxol today.  Patient  will directly proceed for cycle 4-day one of Taxol Herceptin and Perjeta in 1 week and I will see her back in 2 weeks for cycle 4-day eight of Taxol.  Plan to obtain repeat echocardiogram and scans after four cycles of treatment  Neoplasm related pain: Continue as needed oxycodone and OxyContin  Bone metastases: She will receive first dose of xgeva today.  Again discussed risks and benefits of xgeva including all but not limited to hypocalcemia and possible risk of osteonecrosis of the jaw.  We have not been able to obtain dental clearance because of her lack of insurance.  However given her extensive bone metastases the benefits of xgeva outweigh the possible risks.     Visit Diagnosis 1. Encounter for antineoplastic chemotherapy   2. Bone metastases ([place])   3. Primary cancer of left breast with metastasis to other site ([place])   4. Neoplasm related pain      Dr. Randa Evens, MD, MPH [facility] [phone] 10/31/2019 12:57 PM

## 2019-11-03 ENCOUNTER — Encounter: Payer: Self-pay | Admitting: Radiation Oncology

## 2019-11-03 ENCOUNTER — Inpatient Hospital Stay: Payer: Self-pay

## 2019-11-03 ENCOUNTER — Other Ambulatory Visit: Payer: Self-pay

## 2019-11-03 ENCOUNTER — Ambulatory Visit
Admission: RE | Admit: 2019-11-03 | Discharge: 2019-11-03 | Disposition: A | Discharge status: Home / Self Care | Payer: Self-pay | Source: Ambulatory Visit | Attending: Radiation Oncology | Admitting: Radiation Oncology

## 2019-11-03 DIAGNOSIS — Z171 Estrogen receptor negative status [ER-]: Secondary | ICD-10-CM | POA: Insufficient documentation

## 2019-11-03 DIAGNOSIS — Z923 Personal history of irradiation: Secondary | ICD-10-CM | POA: Insufficient documentation

## 2019-11-03 DIAGNOSIS — C50911 Malignant neoplasm of unspecified site of right female breast: Secondary | ICD-10-CM | POA: Insufficient documentation

## 2019-11-03 DIAGNOSIS — C7951 Secondary malignant neoplasm of bone: Secondary | ICD-10-CM | POA: Insufficient documentation

## 2019-11-03 NOTE — Progress Notes (Signed)
Radiation Oncology Follow up Note  Name: Sophia [name]   Date:   11/03/2019 MRN:  [id number] DOB: Nov 04, 1980    This 39 y.o. female presents to the clinic today for 1 month follow-up status post palliative radiation therapy to her pelvis for widely metastatic stage IV breast cancer.  REFERRING PROVIDER: Center, [facility]*  HPI: Patient is a 39 year old Spanish-speaking female now at 1 month having completed palliative radiation therapy to her pelvis. She did have surgical repair of her right hip. Her radiation centered over her right pelvis and right proximal femur. She states she still has some radicular type pain in the right lower extremity not sure of the origin of that may be spinal or from her prior surgery. She otherwise state her pain is under excellent control. She is currently on weekly Taxol chemotherapy for palliation.  COMPLICATIONS OF TREATMENT: none  FOLLOW UP COMPLIANCE: keeps appointments   PHYSICAL EXAM:  BP (P) 123/85 (BP Location: Left Arm, Patient Position: Sitting)   Pulse (P) 91   Temp (P) 97.6 F (36.4 C) (Tympanic)   Resp (P) 16   Wt (P) 125 lb 4.8 oz (56.8 kg)   BMI (P) 24.47 kg/m  Range of motion of her lower extremities does not elicit pain. Motor or sensory and DTR levels are equal symmetric in the lower extremities. Palpation of her spine does not elicit pain. Well-developed well-nourished patient in NAD. HEENT reveals PERLA, EOMI, discs not visualized.  Oral cavity is clear. No oral mucosal lesions are identified. Neck is clear without evidence of cervical or supraclavicular adenopathy. Lungs are clear to A&P. Cardiac examination is essentially unremarkable with regular rate and rhythm without murmur rub or thrill. Abdomen is benign with no organomegaly or masses noted. Motor sensory and DTR levels are equal and symmetric in the upper and lower extremities. Cranial nerves II through XII are grossly intact. Proprioception is intact. No peripheral  adenopathy or edema is identified. No motor or sensory levels are noted. Crude visual fields are within normal range.  RADIOLOGY RESULTS: Prior treatment fields are reviewed  PLAN: Present time patient is under excellent palliative control of her widespread metastatic disease from breast cancer. I am turning follow-up care over to medical oncology. I be happy to reevaluate the patient anytime should further treatment be indicated.  I would like to take this opportunity to thank you for allowing me to participate in the care of your patient.Noreene Filbert, MD

## 2019-11-07 ENCOUNTER — Other Ambulatory Visit: Payer: Self-pay

## 2019-11-07 ENCOUNTER — Inpatient Hospital Stay: Payer: Self-pay

## 2019-11-07 ENCOUNTER — Telehealth: Payer: Self-pay | Admitting: *Deleted

## 2019-11-07 VITALS — BP 113/75 | HR 91 | Temp 96.9°F | Wt 123.2 lb

## 2019-11-07 DIAGNOSIS — Z5111 Encounter for antineoplastic chemotherapy: Secondary | ICD-10-CM

## 2019-11-07 DIAGNOSIS — E876 Hypokalemia: Secondary | ICD-10-CM

## 2019-11-07 DIAGNOSIS — C50919 Malignant neoplasm of unspecified site of unspecified female breast: Secondary | ICD-10-CM

## 2019-11-07 DIAGNOSIS — M8440XA Pathological fracture, unspecified site, initial encounter for fracture: Secondary | ICD-10-CM

## 2019-11-07 DIAGNOSIS — C50912 Malignant neoplasm of unspecified site of left female breast: Secondary | ICD-10-CM

## 2019-11-07 DIAGNOSIS — C7951 Secondary malignant neoplasm of bone: Secondary | ICD-10-CM

## 2019-11-07 DIAGNOSIS — G893 Neoplasm related pain (acute) (chronic): Secondary | ICD-10-CM

## 2019-11-07 LAB — CBC WITH DIFFERENTIAL/PLATELET
Abs Immature Granulocytes: 0.03 10*3/uL (ref 0.00–0.07)
Basophils Absolute: 0 10*3/uL (ref 0.0–0.1)
Basophils Relative: 1 %
Eosinophils Absolute: 0.1 10*3/uL (ref 0.0–0.5)
Eosinophils Relative: 2 %
HCT: 34.7 % — ABNORMAL LOW (ref 36.0–46.0)
Hemoglobin: 11.6 g/dL — ABNORMAL LOW (ref 12.0–15.0)
Immature Granulocytes: 1 %
Lymphocytes Relative: 24 %
Lymphs Abs: 1.4 10*3/uL (ref 0.7–4.0)
MCH: 28.9 pg (ref 26.0–34.0)
MCHC: 33.4 g/dL (ref 30.0–36.0)
MCV: 86.3 fL (ref 80.0–100.0)
Monocytes Absolute: 0.4 10*3/uL (ref 0.1–1.0)
Monocytes Relative: 6 %
Neutro Abs: 3.8 10*3/uL (ref 1.7–7.7)
Neutrophils Relative %: 66 %
Platelets: 450 10*3/uL — ABNORMAL HIGH (ref 150–400)
RBC: 4.02 MIL/uL (ref 3.87–5.11)
RDW: 15.2 % (ref 11.5–15.5)
WBC: 5.7 10*3/uL (ref 4.0–10.5)
nRBC: 0 % (ref 0.0–0.2)

## 2019-11-07 LAB — COMPREHENSIVE METABOLIC PANEL
ALT: 42 U/L (ref 0–44)
AST: 45 U/L — ABNORMAL HIGH (ref 15–41)
Albumin: 3.6 g/dL (ref 3.5–5.0)
Alkaline Phosphatase: 101 U/L (ref 38–126)
Anion gap: 10 (ref 5–15)
BUN: 10 mg/dL (ref 6–20)
CO2: 20 mmol/L — ABNORMAL LOW (ref 22–32)
Calcium: 7.9 mg/dL — ABNORMAL LOW (ref 8.9–10.3)
Chloride: 105 mmol/L (ref 98–111)
Creatinine, Ser: 0.54 mg/dL (ref 0.44–1.00)
GFR calc Af Amer: >60 mL/min (ref >60)
GFR calc non Af Amer: >60 mL/min (ref >60)
Glucose, Bld: 154 mg/dL — ABNORMAL HIGH (ref 70–99)
Potassium: 2.9 mmol/L — ABNORMAL LOW (ref 3.5–5.1)
Sodium: 135 mmol/L (ref 135–145)
Total Bilirubin: 0.4 mg/dL (ref 0.3–1.2)
Total Protein: 7.8 g/dL (ref 6.5–8.1)

## 2019-11-07 MED ORDER — POTASSIUM CHLORIDE CRYS ER 20 MEQ PO TBCR: 20.0000 meq | EXTENDED_RELEASE_TABLET | Freq: Two times a day (BID) | ORAL | 0 refills | Status: DC

## 2019-11-07 MED ORDER — HEPARIN SOD (PORK) LOCK FLUSH 100 UNIT/ML IV SOLN
500.0000 [IU] | Freq: Once | INTRAVENOUS | Status: AC
  Administered 2019-11-07: 500 [IU] via INTRAVENOUS
  Filled 2019-11-07: qty 5

## 2019-11-07 MED ORDER — TRASTUZUMAB CHEMO 150 MG IV SOLR
6.0000 mg/kg | Freq: Once | INTRAVENOUS | Status: AC
  Administered 2019-11-07: 357 mg via INTRAVENOUS
  Filled 2019-11-07: qty 17

## 2019-11-07 MED ORDER — SODIUM CHLORIDE 0.9% FLUSH
10.0000 mL | Freq: Once | INTRAVENOUS | Status: AC
  Administered 2019-11-07: 10 mL via INTRAVENOUS
  Filled 2019-11-07: qty 10

## 2019-11-07 MED ORDER — HEPARIN SOD (PORK) LOCK FLUSH 100 UNIT/ML IV SOLN
INTRAVENOUS | Status: AC
  Filled 2019-11-07: qty 5

## 2019-11-07 MED ORDER — SODIUM CHLORIDE 0.9 % IV SOLN
65.0000 mg/m2 | Freq: Once | INTRAVENOUS | Status: AC
  Administered 2019-11-07: 108 mg via INTRAVENOUS
  Filled 2019-11-07: qty 18

## 2019-11-07 MED ORDER — SODIUM CHLORIDE 0.9 % IV SOLN
20.0000 mg | Freq: Once | INTRAVENOUS | Status: AC
  Administered 2019-11-07: 20 mg via INTRAVENOUS
  Filled 2019-11-07: qty 20

## 2019-11-07 MED ORDER — SODIUM CHLORIDE 0.9 % IV SOLN
420.0000 mg | Freq: Once | INTRAVENOUS | Status: AC
  Administered 2019-11-07: 420 mg via INTRAVENOUS
  Filled 2019-11-07: qty 14

## 2019-11-07 MED ORDER — FAMOTIDINE IN NACL 20-0.9 MG/50ML-% IV SOLN
20.0000 mg | Freq: Once | INTRAVENOUS | Status: AC
  Administered 2019-11-07: 20 mg via INTRAVENOUS
  Filled 2019-11-07: qty 50

## 2019-11-07 MED ORDER — TRAZODONE HCL 50 MG PO TABS: 50.0000 mg | ORAL_TABLET | Freq: Every day | ORAL | 1 refills | Status: DC

## 2019-11-07 MED ORDER — ACETAMINOPHEN 325 MG PO TABS
650.0000 mg | ORAL_TABLET | Freq: Once | ORAL | Status: AC
  Administered 2019-11-07: 650 mg via ORAL
  Filled 2019-11-07: qty 2

## 2019-11-07 MED ORDER — DIPHENHYDRAMINE HCL 50 MG/ML IJ SOLN
50.0000 mg | Freq: Once | INTRAMUSCULAR | Status: AC
  Administered 2019-11-07: 50 mg via INTRAVENOUS
  Filled 2019-11-07: qty 1

## 2019-11-07 MED ORDER — SODIUM CHLORIDE 0.9 % IV SOLN
Freq: Once | INTRAVENOUS | Status: AC
  Filled 2019-11-07: qty 250

## 2019-11-07 MED ORDER — SODIUM CHLORIDE 0.9 % IV SOLN
Freq: Once | INTRAVENOUS | Status: AC
  Filled 2019-11-07: qty 1000

## 2019-11-07 NOTE — Telephone Encounter (Signed)
Pt said to today in infusion that she was using ativan for sleep and it is not working. She wanted to see if anything else for sleep. I spoke to Janese Banks and she said trazodone 50 mg at night and [facility]. Pt agreeable to this plan

## 2019-11-08 LAB — CANCER ANTIGEN 27.29: CA 27.29: 23.8 U/mL (ref 0.0–38.6)

## 2019-11-08 LAB — CANCER ANTIGEN 15-3: CA 15-3: 22.2 U/mL (ref 0.0–25.0)

## 2019-11-10 ENCOUNTER — Telehealth: Payer: Self-pay | Admitting: *Deleted

## 2019-11-10 NOTE — Telephone Encounter (Signed)
I also called pt and she is ok getting labs done tom. Then she will not need them later in the week for chemo per Dr. Janese Banks. Pt is agreeable and will come 9:15 tom. am

## 2019-11-10 NOTE — Telephone Encounter (Signed)
I can see her tomorrow at 9.30 or 10.45 am

## 2019-11-10 NOTE — Telephone Encounter (Signed)
Call from interpreter stating that patient called them and reports that the medicine given to help her sleep is causing her to be hyoer instead and she has stopped taking it. Also reports diarrhea Sunday and she took medicine for it and the diarrhea has stopped, but she continues to have abdominal cramping. Reports dizziness and headache and the pain medicine she has is not controlling her pain. She also reports that she is having a little pain in the front of her leg and is asking what Gabapentin is used for. Please advise.

## 2019-11-10 NOTE — Telephone Encounter (Signed)
The interpreter left message that the medicine that pt got for sleep- she took it Friday and Saturday and she did the opposite of sleeping and stayed up most of night. She did not take any more since then- the pills was trazodone. Then Saturday she had diarrhea and took imodium and it got better . She has been taking her pain pill and she had HA and dizziness. Dizziness got better but not HA- what to take for HA.left leg hurts a  little in the front portion of her leg. All info was sent to rao and she was added on to be seen tomorrow. Maritza called and let pt know about appt. Date and time

## 2019-11-10 NOTE — Telephone Encounter (Signed)
Via interpreter, patient accepts appointment for tomorrow morning at 930. Appointment added and Charlean Merl requested interpreter for this appointment

## 2019-11-11 ENCOUNTER — Other Ambulatory Visit: Payer: Self-pay

## 2019-11-11 ENCOUNTER — Inpatient Hospital Stay: Payer: Self-pay

## 2019-11-11 ENCOUNTER — Inpatient Hospital Stay (HOSPITAL_BASED_OUTPATIENT_CLINIC_OR_DEPARTMENT_OTHER): Payer: Self-pay | Admitting: Oncology

## 2019-11-11 VITALS — BP 111/81 | HR 79 | Wt 123.6 lb

## 2019-11-11 DIAGNOSIS — C50919 Malignant neoplasm of unspecified site of unspecified female breast: Secondary | ICD-10-CM

## 2019-11-11 DIAGNOSIS — C50912 Malignant neoplasm of unspecified site of left female breast: Secondary | ICD-10-CM

## 2019-11-11 DIAGNOSIS — C7951 Secondary malignant neoplasm of bone: Secondary | ICD-10-CM

## 2019-11-11 DIAGNOSIS — Z5111 Encounter for antineoplastic chemotherapy: Secondary | ICD-10-CM

## 2019-11-11 DIAGNOSIS — G893 Neoplasm related pain (acute) (chronic): Secondary | ICD-10-CM

## 2019-11-11 LAB — COMPREHENSIVE METABOLIC PANEL
ALT: 36 U/L (ref 0–44)
AST: 35 U/L (ref 15–41)
Albumin: 3.7 g/dL (ref 3.5–5.0)
Alkaline Phosphatase: 94 U/L (ref 38–126)
Anion gap: 8 (ref 5–15)
BUN: 8 mg/dL (ref 6–20)
CO2: 23 mmol/L (ref 22–32)
Calcium: 8.4 mg/dL — ABNORMAL LOW (ref 8.9–10.3)
Chloride: 105 mmol/L (ref 98–111)
Creatinine, Ser: 0.51 mg/dL (ref 0.44–1.00)
GFR calc Af Amer: >60 mL/min (ref >60)
GFR calc non Af Amer: >60 mL/min (ref >60)
Glucose, Bld: 122 mg/dL — ABNORMAL HIGH (ref 70–99)
Potassium: 3.2 mmol/L — ABNORMAL LOW (ref 3.5–5.1)
Sodium: 136 mmol/L (ref 135–145)
Total Bilirubin: 0.6 mg/dL (ref 0.3–1.2)
Total Protein: 7.6 g/dL (ref 6.5–8.1)

## 2019-11-11 LAB — CBC WITH DIFFERENTIAL/PLATELET
Abs Immature Granulocytes: 0.02 10*3/uL (ref 0.00–0.07)
Basophils Absolute: 0 10*3/uL (ref 0.0–0.1)
Basophils Relative: 0 %
Eosinophils Absolute: 0.1 10*3/uL (ref 0.0–0.5)
Eosinophils Relative: 1 %
HCT: 36.6 % (ref 36.0–46.0)
Hemoglobin: 12.1 g/dL (ref 12.0–15.0)
Immature Granulocytes: 0 %
Lymphocytes Relative: 14 %
Lymphs Abs: 0.8 10*3/uL (ref 0.7–4.0)
MCH: 28.3 pg (ref 26.0–34.0)
MCHC: 33.1 g/dL (ref 30.0–36.0)
MCV: 85.5 fL (ref 80.0–100.0)
Monocytes Absolute: 0.2 10*3/uL (ref 0.1–1.0)
Monocytes Relative: 3 %
Neutro Abs: 5 10*3/uL (ref 1.7–7.7)
Neutrophils Relative %: 82 %
Platelets: 425 10*3/uL — ABNORMAL HIGH (ref 150–400)
RBC: 4.28 MIL/uL (ref 3.87–5.11)
RDW: 15.4 % (ref 11.5–15.5)
WBC: 6.1 10*3/uL (ref 4.0–10.5)
nRBC: 0 % (ref 0.0–0.2)

## 2019-11-11 MED ORDER — TRAZODONE HCL 100 MG PO TABS: 100.0000 mg | ORAL_TABLET | Freq: Every day | ORAL | 2 refills | Status: DC

## 2019-11-11 NOTE — Progress Notes (Signed)
Hematology/Oncology Consult note [facility]  [phone] [phone]  Patient Care Team: [place], [place], NP as PCP - General Rico Junker, RN as Registered Nurse Theodore Demark, RN as Registered Nurse   Name of the patient: Sophia [name]  [id number]  1981-01-09   Date of visit: 11/11/19  Diagnosis-  stage IV metastatic breast cancer ER/PR negative HER-2/neu positive with bone metastases  Chief complaint/ Reason for visit-on treatment assessment prior to cycle 4-day 8 of weekly Taxol chemotherapy  Heme/Onc history: patient is a 39 year old Hispanic female who is here with her friend. History obtained with the help of an interpreter.Patient self palpated left breast mass which was followed by a diagnostic bilateral mammogram. Mammogram showed 3.1 x 2.9 x 1.9 cm hypoechoic mass at the 1 o'clock position of the left breast. For abnormal cortically thickened left axillary lymph nodes measuring up to 5 mm. 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 Patient was also having ongoing back pain and was seen by [facility] orthopedics Dr. Doyle Askew who ordered MRI lumbar spine without contrast which showed possible pathologic fractures of L1 and L4 vertebral bodies with greater than 50% height loss at L1 and abnormal signal involving L2-L3 S1 as well as right iliac bone concerning for metastatic disease.Patient is a single mother of 3 adult children and is very anxious today.She reports significant back pain which radiates to her bilateral thighs. Denies any focal tingling numbness or weakness. Denies any bowel bladder incontinence. Pain has been uncontrolled despite taking Tylenol. No prior history of abnormal breast biopsies. No family history of breast cancer  PET and MRIshowed 3 areas of pathologic fracture of  her spine as well as widespread bony metastatic disease and concern for impending fracture of the right hip. Given her worsening pain she was asked to come to the ER. She has been evaluated by Dr. Rudene Christians from orthopedic surgery and underwent kyphoplasty at 3 different levels.T6 L1 and L4 along with radiofrequency ablation.She also underwent prophylactic fixation of the right hip and not affected the sacral region.   Patient received first dose of Herceptin and Perjeta on 09/04/2019. Baseline echocardiogram normal.she is currently getting taxol/ herceptin/perjeta  Patient admitted to hospital for acute abdominal pain with CT findings concerning for acute colitis. Colonoscopy showed diffuse severe inflammation with erythema friability and loss of vascularity and shallow ulcerations in the IC valve, ascending colon and cecum.    Interval history-history obtained with the help of Spanish interpreter Maritza.  Patient reports following issues:  1.  Trouble sleeping: Patient was started on trazodone 50 mg at night but reports it was not been helping her.  She is unable to tell exactly what is keeping her awake at night but does report some component of anxiety.  She used to take as needed Ativan during the day but has stopped taking it recently. 2.  Reports occasional pain and tenderness at the port site 3.  Reports pain which is more superficial in her bilateral thighs.  Back pain is relatively well controlled 4.  She gets occasional episodes of abdominal cramping and diarrhea for which she is taking Imodium.  Feels that her appetite is not so good.  She has lost 3 pounds in the last 3 weeks  ECOG PS- 1 Pain scale- 0 Opioid associated constipation- no  Review of systems- Review  of Systems  Constitutional: Positive for malaise/fatigue. Negative for chills, fever and weight loss.       Lack of appetite  HENT: Negative for congestion, ear discharge and nosebleeds.   Eyes: Negative for  blurred vision.  Respiratory: Negative for cough, hemoptysis, sputum production, shortness of breath and wheezing.   Cardiovascular: Negative for chest pain, palpitations, orthopnea and claudication.  Gastrointestinal: Positive for diarrhea. Negative for abdominal pain, blood in stool, constipation, heartburn, melena, nausea and vomiting.  Genitourinary: Negative for dysuria, flank pain, frequency, hematuria and urgency.  Musculoskeletal: Negative for back pain, joint pain and myalgias.       Bilateral leg pain  Skin: Negative for rash.  Neurological: Negative for dizziness, tingling, focal weakness, seizures, weakness and headaches.  Endo/Heme/Allergies: Does not bruise/bleed easily.  Psychiatric/Behavioral: Negative for depression and suicidal ideas. The patient has insomnia.        No Known Allergies   Past Medical History:  Diagnosis Date  . Anxiety   . Breast cancer ([place])    with mets  . Cancer ([place])   . Colitis   . Vertigo      Past Surgical History:  Procedure Laterality Date  . BREAST BIOPSY Left 08/14/2019   [place] bx of mass, coil marker, path pending  . BREAST BIOPSY Left 08/14/2019   [place] bx of LN, hydromarker, path pending  . BREAST BIOPSY Left 08/14/2019   affirm bx of calcs, x marker, path pending  . ESOPHAGOGASTRODUODENOSCOPY (EGD) WITH PROPOFOL N/A 10/05/2019   Procedure: ESOPHAGOGASTRODUODENOSCOPY (EGD) WITH PROPOFOL;  Surgeon: Lin Landsman, MD;  Location: [place];  Service: Gastroenterology;  Laterality: N/A;  . FLEXIBLE SIGMOIDOSCOPY N/A 10/05/2019   Procedure: FLEXIBLE SIGMOIDOSCOPY;  Surgeon: Lin Landsman, MD;  Location[facility] ENDOSCOPY;  Service: Gastroenterology;  Laterality: N/A;  . INTRAMEDULLARY (IM) NAIL INTERTROCHANTERIC Right 09/01/2019   Procedure: INTRAMEDULLARY (IM) NAIL INTERTROCHANTRIC AND RADIOFREQUENCY ABLATION;  Surgeon: Hessie Knows, MD;  Location: [facility] ORS;  Service: Orthopedics;  Laterality: Right;  . KYPHOPLASTY N/A  08/29/2019   Procedure: KYPHOPLASTY T6, L1,L4 ,  RADIOFREQUENCY ABLATION;  Surgeon: Hessie Knows, MD;  Location: [facility] ORS;  Service: Orthopedics;  Laterality: N/A;  . KYPHOPLASTY Right 09/01/2019   Procedure: Right Sacral Radiofrequency Ablation and Cement Augmentation, Right sacrum and iliac crest;  Surgeon: Hessie Knows, MD;  Location: [facility] ORS;  Service: Orthopedics;  Laterality: Right;  . PORTA CATH INSERTION N/A 08/28/2019   Procedure: PORTA CATH INSERTION;  Surgeon: Algernon Huxley, MD;  Location: [place] [facility];  Service: Cardiovascular;  Laterality: N/A;    Social History   Socioeconomic History  . Marital status: Single    Spouse name: Not on file  . Number of children: Not on file  . Years of education: Not on file  . Highest education level: Not on file  Occupational History  . Not on file  Tobacco Use  . Smoking status: Never Smoker  . Smokeless tobacco: Never Used  Vaping Use  . Vaping Use: Never used  Substance and Sexual Activity  . Alcohol use: Not Currently  . Drug use: Not Currently  . Sexual activity: Not Currently    Birth control/protection: None  Other Topics Concern  . Not on file  Social History Narrative   Lives at home with children   Social Determinants of Health   Financial Resource Strain:   . Difficulty of Paying Living Expenses: Not on file  Food Insecurity:   . Worried About Charity fundraiser in  the Last Year: Not on file  . Ran Out of Food in the Last Year: Not on file  Transportation Needs:   . Lack of Transportation (Medical): Not on file  . Lack of Transportation (Non-Medical): Not on file  Physical Activity:   . Days of Exercise per Week: Not on file  . Minutes of Exercise per Session: Not on file  Stress:   . Feeling of Stress : Not on file  Social Connections:   . Frequency of Communication with Friends and Family: Not on file  . Frequency of Social Gatherings with Friends and Family: Not on file  . Attends Religious  Services: Not on file  . Active Member of Clubs or Organizations: Not on file  . Attends Archivist Meetings: Not on file  . Marital Status: Not on file  Intimate Partner Violence:   . Fear of Current or Ex-Partner: Not on file  . Emotionally Abused: Not on file  . Physically Abused: Not on file  . Sexually Abused: Not on file    Family History  Problem Relation Age of Onset  . Colon cancer Maternal Uncle      Current Outpatient Medications:  .  ondansetron (ZOFRAN) 4 MG tablet, Take 1 tablet (4 mg total) by mouth every 6 (six) hours as needed for nausea or vomiting., Disp: 60 tablet, Rfl: 0 .  oxyCODONE (OXY IR/ROXICODONE) 5 MG immediate release tablet, Take 1-2 tablets (5-10 mg total) by mouth every 4 (four) hours as needed for moderate pain., Disp: 120 tablet, Rfl: 0 .  oxyCODONE (OXYCONTIN) 10 mg 12 hr tablet, Take 1 tablet (10 mg total) by mouth every 12 (twelve) hours., Disp: 60 tablet, Rfl: 0 .  potassium chloride SA (KLOR-CON) 20 MEQ tablet, Take 1 tablet (20 mEq total) by mouth 2 (two) times daily., Disp: 28 tablet, Rfl: 0 .  senna-docusate (SENOKOT-S) 8.6-50 MG tablet, Take 1 tablet by mouth 2 (two) times daily., Disp: 30 tablet, Rfl: 1 .  clindamycin (CLINDAGEL) 1 % gel, Apply topically 2 (two) times daily. X 7 days. (Patient not taking: Reported on 10/31/2019), Disp: 30 g, Rfl: 0 .  diphenoxylate-atropine (LOMOTIL) 2.5-0.025 MG tablet, Take 1 tablet by mouth 4 (four) times daily as needed for diarrhea or loose stools. (Patient not taking: Reported on 10/31/2019), Disp: 30 tablet, Rfl: 0 .  ferrous sulfate 325 (65 FE) MG tablet, Take 1 tablet (325 mg total) by mouth daily with breakfast. (Patient not taking: Reported on 10/17/2019), Disp: 30 tablet, Rfl: 0 .  gabapentin (NEURONTIN) 300 MG capsule, Take 1 capsule (300 mg total) by mouth 2 (two) times daily. (Patient not taking: Reported on 11/11/2019), Disp: 60 capsule, Rfl: 1 .  hydrOXYzine (ATARAX/VISTARIL) 10 MG tablet,  Take 1 tablet (10 mg total) by mouth 3 (three) times daily as needed for anxiety. (Patient not taking: Reported on 10/10/2019), Disp: 30 tablet, Rfl: 0 .  lidocaine-prilocaine (EMLA) cream, Apply 1 application topically as needed. Apply small amount to port site at least 1 hour prior to it being accessed, cover with plastic wrap (Patient not taking: Reported on 10/31/2019), Disp: 30 g, Rfl: 1 .  LORazepam (ATIVAN) 0.5 MG tablet, Take 1 tablet (0.5 mg total) by mouth every 6 (six) hours as needed for anxiety. (Patient not taking: Reported on 10/31/2019), Disp: 30 tablet, Rfl: 0 .  pantoprazole (PROTONIX) 40 MG tablet, Take 1 tablet (40 mg total) by mouth daily. (Patient not taking: Reported on 10/10/2019), Disp: 30 tablet, Rfl: 1 .  sucralfate (CARAFATE) 1 g tablet, Take 1 tablet (1 g total) by mouth 4 (four) times daily -  with meals and at bedtime for 14 days. (Patient not taking: Reported on 10/17/2019), Disp: 56 tablet, Rfl: 0 .  traZODone (DESYREL) 50 MG tablet, Take 1 tablet (50 mg total) by mouth at bedtime. (Patient not taking: Reported on 11/11/2019), Disp: 30 tablet, Rfl: 1  Physical exam:  Vitals:   11/11/19 1051  BP: 111/81  Pulse: 79  SpO2: 100%  Weight: 123 lb 9.6 oz (56.1 kg)   Physical Exam Constitutional:      General: She is not in acute distress. Cardiovascular:     Rate and Rhythm: Normal rate and regular rhythm.     Heart sounds: Normal heart sounds.  Pulmonary:     Effort: Pulmonary effort is normal.     Breath sounds: Normal breath sounds.  Abdominal:     General: Bowel sounds are normal.     Palpations: Abdomen is soft.  Skin:    General: Skin is warm and dry.  Neurological:     Mental Status: She is alert and oriented to person, place, and time.      CMP Latest Ref Rng & Units 11/11/2019  Glucose 70 - 99 mg/dL 902(X)  BUN 6 - 20 mg/dL 8  Creatinine 1.15 - 5.20 mg/dL 8.02  Sodium 233 - 612 mmol/L 136  Potassium 3.5 - 5.1 mmol/L 3.2(L)  Chloride 98 - 111 mmol/L  105  CO2 22 - 32 mmol/L 23  Calcium 8.9 - 10.3 mg/dL 2.4(S)  Total Protein 6.5 - 8.1 g/dL 7.6  Total Bilirubin 0.3 - 1.2 mg/dL 0.6  Alkaline Phos 38 - 126 U/L 94  AST 15 - 41 U/L 35  ALT 0 - 44 U/L 36   CBC Latest Ref Rng & Units 11/11/2019  WBC 4.0 - 10.5 K/uL 6.1  Hemoglobin 12.0 - 15.0 g/dL 97.5  Hematocrit 36 - 46 % 36.6  Platelets 150 - 400 K/uL 425(H)     Assessment and plan- Patient is a 39 y.o. female with metastatic ER negative her 2 positive breast cancer on taxol/herceptin and perjeta.She is here for on treatment assessment prior to cycle 4-day 8 of Taxol and for following issues:  1.  Insomnia: I will increase her trazodone to 100 mg at night and reassess her symptoms in 1 week.  If symptoms are no better I would try switching her to Remeron which can help with sleep and appetite 2.  Counts otherwise okay to proceed with cycle 4-day 8 of Taxol in 3 days time.  Her tumor markers have normalized indicating response to treatment.  I will plan to get scans next month.  I will see her in 1 week for cycle 4-day 15 of Taxol 3.  Patient will need Xgeva as well as echocardiogram at next visit 4.  Diarrhea: Likely secondary to Herceptin and Perjeta.  Continue as needed Imodium 5.  Neoplasm related pain: Back pain is currently well controlled.  Patient reports occasional pain in her bilateral legs which is more superficial.  No difficulty in ambulation.  Continue OxyContin as needed oxycodone and gabapentin 6.  Patient reports occasional tenderness at the port site.  The site appears normal with no signs of infection.  She has not had any problems with blood draw or flushing so far.  Continue to monitor   Visit Diagnosis 1. Encounter for antineoplastic chemotherapy   2. Neoplasm related pain   3. Bone metastases (HCC)  4. 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 Randa Evens, MD, MPH [facility] at [place] [facility] [phone] 11/11/2019 12:09 PM

## 2019-11-14 ENCOUNTER — Other Ambulatory Visit: Payer: Self-pay

## 2019-11-14 ENCOUNTER — Inpatient Hospital Stay: Payer: Self-pay | Admitting: Oncology

## 2019-11-14 ENCOUNTER — Inpatient Hospital Stay: Payer: Self-pay

## 2019-11-14 VITALS — BP 117/79 | HR 88 | Temp 99.3°F | Resp 18

## 2019-11-14 DIAGNOSIS — C50919 Malignant neoplasm of unspecified site of unspecified female breast: Secondary | ICD-10-CM

## 2019-11-14 DIAGNOSIS — M8440XA Pathological fracture, unspecified site, initial encounter for fracture: Secondary | ICD-10-CM

## 2019-11-14 MED ORDER — DIPHENHYDRAMINE HCL 50 MG/ML IJ SOLN
50.0000 mg | Freq: Once | INTRAMUSCULAR | Status: AC
  Administered 2019-11-14: 50 mg via INTRAVENOUS
  Filled 2019-11-14: qty 1

## 2019-11-14 MED ORDER — FAMOTIDINE IN NACL 20-0.9 MG/50ML-% IV SOLN
20.0000 mg | Freq: Once | INTRAVENOUS | Status: AC
  Administered 2019-11-14: 20 mg via INTRAVENOUS
  Filled 2019-11-14: qty 50

## 2019-11-14 MED ORDER — SODIUM CHLORIDE 0.9 % IV SOLN
Freq: Once | INTRAVENOUS | Status: AC
  Filled 2019-11-14: qty 250

## 2019-11-14 MED ORDER — SODIUM CHLORIDE 0.9% FLUSH
10.0000 mL | INTRAVENOUS | Status: DC | PRN
  Administered 2019-11-14: 10 mL
  Filled 2019-11-14: qty 10

## 2019-11-14 MED ORDER — HEPARIN SOD (PORK) LOCK FLUSH 100 UNIT/ML IV SOLN
500.0000 [IU] | Freq: Once | INTRAVENOUS | Status: AC | PRN
  Administered 2019-11-14: 500 [IU]
  Filled 2019-11-14: qty 5

## 2019-11-14 MED ORDER — SODIUM CHLORIDE 0.9 % IV SOLN
20.0000 mg | Freq: Once | INTRAVENOUS | Status: AC
  Administered 2019-11-14: 20 mg via INTRAVENOUS
  Filled 2019-11-14: qty 20

## 2019-11-14 MED ORDER — SODIUM CHLORIDE 0.9 % IV SOLN
65.0000 mg/m2 | Freq: Once | INTRAVENOUS | Status: AC
  Administered 2019-11-14: 108 mg via INTRAVENOUS
  Filled 2019-11-14: qty 18

## 2019-11-17 ENCOUNTER — Inpatient Hospital Stay: Payer: Self-pay

## 2019-11-17 ENCOUNTER — Encounter: Payer: Self-pay | Admitting: Oncology

## 2019-11-17 ENCOUNTER — Other Ambulatory Visit: Payer: Self-pay

## 2019-11-17 MED FILL — Metronidazole Tab 500 MG: ORAL | Qty: 500 | Status: AC

## 2019-11-17 MED FILL — Oxycodone HCl Tab 5 MG: ORAL | Qty: 10 | Status: AC

## 2019-11-17 NOTE — Progress Notes (Signed)
Nutrition Follow-up:  Patient with metastatic breast cancer.  Patient receiving taxol, perjeta, herceptin.  Met with patient and interpreter Norberta Keens in clinic this am.  Patient reports that she is not fearful of eating certain foods.  Reports that she is eating more foods, although appetite is decreased.  Reports usually has cereal with milk and fruit or oatmeal with milk.  Lunch is beans, tortillas and eggs.  Drinks juice and water.  Dinner is soup (chicken, pasta, vegetables).  Has tried an oral nutrition supplement shake recently as was able to drink it.  Reports taste alterations.    Reports diarrhea is controlled with lomotil.  Medications: lomotil, ferrous sulfate  Labs: reviewed  Anthropometrics:   Weight 125 lb 9 oz today in clinic stable from 125 lb 12.8 oz on 7/30   NUTRITION DIAGNOSIS: Inadequate oral intake stable   INTERVENTION:  Encouraged oral nutrition supplements for added calories and protein. More samples given today (ensure enlive, boost plus, orgain) along with coupons Encouraged protein with every meal.   Patient has contact information    MONITORING, EVALUATION, GOAL: weight trends, intake   NEXT VISIT: October 4, face to face  Kameron Blethen B. Zenia Resides, [place], [place] Registered Dietitian [phone] (mobile)

## 2019-11-21 ENCOUNTER — Inpatient Hospital Stay (HOSPITAL_BASED_OUTPATIENT_CLINIC_OR_DEPARTMENT_OTHER): Payer: Self-pay | Admitting: Oncology

## 2019-11-21 ENCOUNTER — Other Ambulatory Visit: Payer: Self-pay

## 2019-11-21 ENCOUNTER — Encounter: Payer: Self-pay | Admitting: Oncology

## 2019-11-21 ENCOUNTER — Inpatient Hospital Stay: Payer: Self-pay | Attending: Oncology

## 2019-11-21 ENCOUNTER — Inpatient Hospital Stay: Payer: Self-pay

## 2019-11-21 ENCOUNTER — Telehealth: Payer: Self-pay

## 2019-11-21 VITALS — BP 129/83 | HR 75

## 2019-11-21 VITALS — BP 114/77 | HR 76 | Temp 96.7°F | Resp 16 | Wt 126.9 lb

## 2019-11-21 DIAGNOSIS — Z7983 Long term (current) use of bisphosphonates: Secondary | ICD-10-CM

## 2019-11-21 DIAGNOSIS — C50919 Malignant neoplasm of unspecified site of unspecified female breast: Secondary | ICD-10-CM

## 2019-11-21 DIAGNOSIS — Z515 Encounter for palliative care: Secondary | ICD-10-CM | POA: Insufficient documentation

## 2019-11-21 DIAGNOSIS — Z79899 Other long term (current) drug therapy: Secondary | ICD-10-CM | POA: Insufficient documentation

## 2019-11-21 DIAGNOSIS — C50912 Malignant neoplasm of unspecified site of left female breast: Secondary | ICD-10-CM | POA: Insufficient documentation

## 2019-11-21 DIAGNOSIS — M8440XA Pathological fracture, unspecified site, initial encounter for fracture: Secondary | ICD-10-CM

## 2019-11-21 DIAGNOSIS — R5383 Other fatigue: Secondary | ICD-10-CM | POA: Insufficient documentation

## 2019-11-21 DIAGNOSIS — F418 Other specified anxiety disorders: Secondary | ICD-10-CM | POA: Insufficient documentation

## 2019-11-21 DIAGNOSIS — Z5111 Encounter for antineoplastic chemotherapy: Secondary | ICD-10-CM

## 2019-11-21 DIAGNOSIS — C7951 Secondary malignant neoplasm of bone: Secondary | ICD-10-CM | POA: Insufficient documentation

## 2019-11-21 DIAGNOSIS — Z5112 Encounter for antineoplastic immunotherapy: Secondary | ICD-10-CM | POA: Insufficient documentation

## 2019-11-21 DIAGNOSIS — G893 Neoplasm related pain (acute) (chronic): Secondary | ICD-10-CM

## 2019-11-21 DIAGNOSIS — Z87311 Personal history of (healed) other pathological fracture: Secondary | ICD-10-CM | POA: Insufficient documentation

## 2019-11-21 DIAGNOSIS — F329 Major depressive disorder, single episode, unspecified: Secondary | ICD-10-CM

## 2019-11-21 DIAGNOSIS — R309 Painful micturition, unspecified: Secondary | ICD-10-CM

## 2019-11-21 DIAGNOSIS — R5381 Other malaise: Secondary | ICD-10-CM | POA: Insufficient documentation

## 2019-11-21 DIAGNOSIS — R531 Weakness: Secondary | ICD-10-CM | POA: Insufficient documentation

## 2019-11-21 DIAGNOSIS — F32A Depression, unspecified: Secondary | ICD-10-CM

## 2019-11-21 LAB — CBC WITH DIFFERENTIAL/PLATELET
Abs Immature Granulocytes: 0.04 10*3/uL (ref 0.00–0.07)
Basophils Absolute: 0 10*3/uL (ref 0.0–0.1)
Basophils Relative: 1 %
Eosinophils Absolute: 0.1 10*3/uL (ref 0.0–0.5)
Eosinophils Relative: 2 %
HCT: 32.5 % — ABNORMAL LOW (ref 36.0–46.0)
Hemoglobin: 10.9 g/dL — ABNORMAL LOW (ref 12.0–15.0)
Immature Granulocytes: 1 %
Lymphocytes Relative: 16 %
Lymphs Abs: 0.9 10*3/uL (ref 0.7–4.0)
MCH: 28.5 pg (ref 26.0–34.0)
MCHC: 33.5 g/dL (ref 30.0–36.0)
MCV: 85.1 fL (ref 80.0–100.0)
Monocytes Absolute: 0.5 10*3/uL (ref 0.1–1.0)
Monocytes Relative: 8 %
Neutro Abs: 4.3 10*3/uL (ref 1.7–7.7)
Neutrophils Relative %: 72 %
Platelets: 403 10*3/uL — ABNORMAL HIGH (ref 150–400)
RBC: 3.82 MIL/uL — ABNORMAL LOW (ref 3.87–5.11)
RDW: 15.4 % (ref 11.5–15.5)
WBC: 5.9 10*3/uL (ref 4.0–10.5)
nRBC: 0 % (ref 0.0–0.2)

## 2019-11-21 LAB — COMPREHENSIVE METABOLIC PANEL
ALT: 30 U/L (ref 0–44)
AST: 29 U/L (ref 15–41)
Albumin: 3.4 g/dL — ABNORMAL LOW (ref 3.5–5.0)
Alkaline Phosphatase: 100 U/L (ref 38–126)
Anion gap: 8 (ref 5–15)
BUN: 8 mg/dL (ref 6–20)
CO2: 24 mmol/L (ref 22–32)
Calcium: 8.1 mg/dL — ABNORMAL LOW (ref 8.9–10.3)
Chloride: 104 mmol/L (ref 98–111)
Creatinine, Ser: 0.5 mg/dL (ref 0.44–1.00)
GFR calc Af Amer: >60 mL/min (ref >60)
GFR calc non Af Amer: >60 mL/min (ref >60)
Glucose, Bld: 98 mg/dL (ref 70–99)
Potassium: 3.6 mmol/L (ref 3.5–5.1)
Sodium: 136 mmol/L (ref 135–145)
Total Bilirubin: 0.3 mg/dL (ref 0.3–1.2)
Total Protein: 6.8 g/dL (ref 6.5–8.1)

## 2019-11-21 LAB — URINALYSIS, COMPLETE (UACMP) WITH MICROSCOPIC
Bilirubin Urine: NEGATIVE
Glucose, UA: NEGATIVE mg/dL
Hgb urine dipstick: NEGATIVE
Ketones, ur: NEGATIVE mg/dL
Nitrite: NEGATIVE
Protein, ur: 30 mg/dL — AB
Specific Gravity, Urine: 1.021 (ref 1.005–1.030)
Squamous Epithelial / HPF: NONE SEEN (ref 0–5)
WBC, UA: >50 WBC/hpf — ABNORMAL HIGH (ref 0–5)
pH: 6 (ref 5.0–8.0)

## 2019-11-21 MED ORDER — FAMOTIDINE IN NACL 20-0.9 MG/50ML-% IV SOLN
20.0000 mg | Freq: Once | INTRAVENOUS | Status: AC
  Administered 2019-11-21: 20 mg via INTRAVENOUS
  Filled 2019-11-21: qty 50

## 2019-11-21 MED ORDER — DIPHENOXYLATE-ATROPINE 2.5-0.025 MG PO TABS: 1.0000 | ORAL_TABLET | Freq: Four times a day (QID) | ORAL | 0 refills | Status: DC | PRN

## 2019-11-21 MED ORDER — SERTRALINE HCL 25 MG PO TABS: 25.0000 mg | ORAL_TABLET | Freq: Every day | ORAL | 0 refills | Status: DC

## 2019-11-21 MED ORDER — DIPHENHYDRAMINE HCL 50 MG/ML IJ SOLN
50.0000 mg | Freq: Once | INTRAMUSCULAR | Status: AC
  Administered 2019-11-21: 50 mg via INTRAVENOUS
  Filled 2019-11-21: qty 1

## 2019-11-21 MED ORDER — TRAZODONE HCL 100 MG PO TABS: 100.0000 mg | ORAL_TABLET | Freq: Every day | ORAL | 2 refills | Status: DC

## 2019-11-21 MED ORDER — SODIUM CHLORIDE 0.9 % IV SOLN
Freq: Once | INTRAVENOUS | Status: AC
  Filled 2019-11-21: qty 250

## 2019-11-21 MED ORDER — SODIUM CHLORIDE 0.9 % IV SOLN
65.0000 mg/m2 | Freq: Once | INTRAVENOUS | Status: AC
  Administered 2019-11-21: 108 mg via INTRAVENOUS
  Filled 2019-11-21: qty 18

## 2019-11-21 MED ORDER — SODIUM CHLORIDE 0.9% FLUSH
10.0000 mL | INTRAVENOUS | Status: DC | PRN
  Administered 2019-11-21: 10 mL via INTRAVENOUS
  Filled 2019-11-21: qty 10

## 2019-11-21 MED ORDER — TRAZODONE HCL 100 MG PO TABS
100.0000 mg | ORAL_TABLET | Freq: Every day | ORAL | 2 refills | Status: DC
Start: 2019-11-21 — End: 2019-11-21

## 2019-11-21 MED ORDER — SODIUM CHLORIDE 0.9 % IV SOLN
20.0000 mg | Freq: Once | INTRAVENOUS | Status: AC
  Administered 2019-11-21: 20 mg via INTRAVENOUS
  Filled 2019-11-21: qty 20

## 2019-11-21 MED ORDER — SULFAMETHOXAZOLE-TRIMETHOPRIM 800-160 MG PO TABS: 1.0000 | ORAL_TABLET | Freq: Two times a day (BID) | ORAL | 0 refills | Status: DC

## 2019-11-21 MED ORDER — HEPARIN SOD (PORK) LOCK FLUSH 100 UNIT/ML IV SOLN
500.0000 [IU] | Freq: Once | INTRAVENOUS | Status: AC
  Administered 2019-11-21: 500 [IU] via INTRAVENOUS
  Filled 2019-11-21: qty 5

## 2019-11-21 MED ORDER — HEPARIN SOD (PORK) LOCK FLUSH 100 UNIT/ML IV SOLN
500.0000 [IU] | Freq: Once | INTRAVENOUS | Status: DC | PRN
  Filled 2019-11-21: qty 5

## 2019-11-21 NOTE — Progress Notes (Signed)
Hematology/Oncology Consult note [facility]  [phone] [phone]  Patient Care Team: [place], [place], NP as PCP - General Rico Junker, RN as Registered Nurse Theodore Demark, RN as Registered Nurse   Name of the patient: Sophia [name]  [id number]  04-19-1980   Date of visit: 11/21/19  Diagnosis- stage IV metastatic breast cancer ER/PR negative HER-2/neu positive with bone metastases  Chief complaint/ Reason for visit-on treatment assessment prior to cycle 4-day 15 of weekly Taxol chemotherapy  Heme/Onc history: patient is a 39 year old Hispanic female who is here with her friend. History obtained with the help of an interpreter.Patient self palpated left breast mass which was followed by a diagnostic bilateral mammogram. Mammogram showed 3.1 x 2.9 x 1.9 cm hypoechoic mass at the 1 o'clock position of the left breast. For abnormal cortically thickened left axillary lymph nodes measuring up to 5 mm. 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 Patient was also having ongoing back pain and was seen by [facility] Dr. Doyle Askew who ordered MRI lumbar spine without contrast which showed possible pathologic fractures of L1 and L4 vertebral bodies with greater than 50% height loss at L1 and abnormal signal involving L2-L3 S1 as well as right iliac bone concerning for metastatic disease.Patient is a single mother of 3 adult children and is very anxious today.She reports significant back pain which radiates to her bilateral thighs. Denies any focal tingling numbness or weakness. Denies any bowel bladder incontinence. Pain has been uncontrolled despite taking Tylenol. No prior history of abnormal breast biopsies. No family history of breast cancer  PET and MRIshowed 3 areas of pathologic fracture of  her spine as well as widespread bony metastatic disease and concern for impending fracture of the right hip. Given her worsening pain she was asked to come to the ER. She has been evaluated by Dr. Rudene Christians from orthopedic surgery and underwent kyphoplasty at 3 different levels.T6 L1 and L4 along with radiofrequency ablation.She also underwent prophylactic fixation of the right hip and not affected the sacral region.   Patient received first dose of Herceptin and Perjeta on 09/04/2019. Baseline echocardiogram normal.she is currently getting taxol/ herceptin/perjeta  Patient admitted to hospital for acute abdominal pain with CT findings concerning for acute colitis. Colonoscopy showed diffuse severe inflammation with erythema friability and loss of vascularity and shallow ulcerations in the IC valve, ascending colon and cecum.  Interval history-continues to have multiple complaints at this time: 1.  Insomnia: Reports that the increase in trazodone is helping.  however she still wakes up after 4 to 5 hours and finds it difficult to go back to sleep. 2.  Reports mild dull aching pain in her left flank which has been ongoing for the last 2 weeks. 3.  Reports occasional nausea for which she is taking her nausea medications. 4.  Reports intermittent diarrhea for a week or two after receiving Herceptin and Perjeta. 5.  Reports new onset headache and occasional lightheadedness over the last 1 week. 6.  Reports having feelings of anxiety and depression.  Denies any suicidal or homicidal ideations. 7.  Reports new onset burning urination  ECOG PS- 1 Pain scale- 6 Opioid associated constipation- no  Review of systems- Review of Systems  Constitutional: Negative for chills, fever, malaise/fatigue and weight loss.  HENT: Negative for congestion, ear discharge and  nosebleeds.   Eyes: Negative for blurred vision.  Respiratory: Negative for cough, hemoptysis, sputum production, shortness of breath  and wheezing.   Cardiovascular: Negative for chest pain, palpitations, orthopnea and claudication.  Gastrointestinal: Positive for nausea. Negative for abdominal pain, blood in stool, constipation, diarrhea, heartburn, melena and vomiting.  Genitourinary: Negative for dysuria, flank pain, frequency, hematuria and urgency.  Musculoskeletal: Positive for back pain. Negative for joint pain and myalgias.  Skin: Negative for rash.  Neurological: Positive for dizziness and headaches. Negative for tingling, focal weakness, seizures and weakness.  Endo/Heme/Allergies: Does not bruise/bleed easily.  Psychiatric/Behavioral: Positive for depression. Negative for suicidal ideas. The patient is nervous/anxious and has insomnia.       No Known Allergies   Past Medical History:  Diagnosis Date  . Anxiety   . Breast cancer ([place])    with mets  . Cancer ([place])   . Colitis   . Vertigo      Past Surgical History:  Procedure Laterality Date  . BREAST BIOPSY Left 08/14/2019   [place] bx of mass, coil marker, path pending  . BREAST BIOPSY Left 08/14/2019   [place] bx of LN, hydromarker, path pending  . BREAST BIOPSY Left 08/14/2019   affirm bx of calcs, x marker, path pending  . ESOPHAGOGASTRODUODENOSCOPY (EGD) WITH PROPOFOL N/A 10/05/2019   Procedure: ESOPHAGOGASTRODUODENOSCOPY (EGD) WITH PROPOFOL;  Surgeon: Lin Landsman, MD;  Location: [facility];  Service: Gastroenterology;  Laterality: N/A;  . FLEXIBLE SIGMOIDOSCOPY N/A 10/05/2019   Procedure: FLEXIBLE SIGMOIDOSCOPY;  Surgeon: Lin Landsman, MD;  Location: [facility] ENDOSCOPY;  Service: Gastroenterology;  Laterality: N/A;  . INTRAMEDULLARY (IM) NAIL INTERTROCHANTERIC Right 09/01/2019   Procedure: INTRAMEDULLARY (IM) NAIL INTERTROCHANTRIC AND RADIOFREQUENCY ABLATION;  Surgeon: Hessie Knows, MD;  Location: [facility] ORS;  Service: Orthopedics;  Laterality: Right;  . KYPHOPLASTY N/A 08/29/2019   Procedure: KYPHOPLASTY T6, L1,L4 ,  RADIOFREQUENCY  ABLATION;  Surgeon: Hessie Knows, MD;  Location: [facility] OR[facility];  Service: Orthopedics;  Laterality: N/A;  . KYPHOPLASTY Right 09/01/2019   Procedure: Right Sacral Radiofrequency Ablation and Cement Augmentation, Right sacrum and iliac crest;  Surgeon: Hessie Knows, MD;  Location: [facility];  Service: Orthopedics;  Laterality: Right;  . PORTA CATH INSERTION N/A 08/28/2019   Procedure: PORTA CATH INSERTION;  Surgeon: Algernon Huxley, MD;  Location: [facility];  Service: Cardiovascular;  Laterality: N/A;    Social History   Socioeconomic History  . Marital status: Single    Spouse name: Not on file  . Number of children: Not on file  . Years of education: Not on file  . Highest education level: Not on file  Occupational History  . Not on file  Tobacco Use  . Smoking status: Never Smoker  . Smokeless tobacco: Never Used  Vaping Use  . Vaping Use: Never used  Substance and Sexual Activity  . Alcohol use: Not Currently  . Drug use: Not Currently  . Sexual activity: Not Currently    Birth control/protection: None  Other Topics Concern  . Not on file  Social History Narrative   Lives at home with children   Social Determinants of Health   Financial Resource Strain:   . Difficulty of Paying Living Expenses: Not on file  Food Insecurity:   . Worried About Charity fundraiser in the Last Year: Not on file  . Ran Out of Food in the Last Year: Not on file  Transportation Needs:   . Lack of Transportation (Medical): Not on file  .  Lack of Transportation (Non-Medical): Not on file  Physical Activity:   . Days of Exercise per Week: Not on file  . Minutes of Exercise per Session: Not on file  Stress:   . Feeling of Stress : Not on file  Social Connections:   . Frequency of Communication with Friends and Family: Not on file  . Frequency of Social Gatherings with Friends and Family: Not on file  . Attends Religious Services: Not on file  . Active Member of Clubs or Organizations: Not  on file  . Attends Archivist Meetings: Not on file  . Marital Status: Not on file  Intimate Partner Violence:   . Fear of Current or Ex-Partner: Not on file  . Emotionally Abused: Not on file  . Physically Abused: Not on file  . Sexually Abused: Not on file    Family History  Problem Relation Age of Onset  . Colon cancer Maternal Uncle      Current Outpatient Medications:  .  clindamycin (CLINDAGEL) 1 % gel, Apply topically 2 (two) times daily. X 7 days. (Patient not taking: Reported on 10/31/2019), Disp: 30 g, Rfl: 0 .  diphenoxylate-atropine (LOMOTIL) 2.5-0.025 MG tablet, Take 1 tablet by mouth 4 (four) times daily as needed for diarrhea or loose stools. (Patient not taking: Reported on 10/31/2019), Disp: 30 tablet, Rfl: 0 .  ferrous sulfate 325 (65 FE) MG tablet, Take 1 tablet (325 mg total) by mouth daily with breakfast. (Patient not taking: Reported on 10/17/2019), Disp: 30 tablet, Rfl: 0 .  gabapentin (NEURONTIN) 300 MG capsule, Take 1 capsule (300 mg total) by mouth 2 (two) times daily. (Patient not taking: Reported on 11/11/2019), Disp: 60 capsule, Rfl: 1 .  hydrOXYzine (ATARAX/VISTARIL) 10 MG tablet, Take 1 tablet (10 mg total) by mouth 3 (three) times daily as needed for anxiety. (Patient not taking: Reported on 10/10/2019), Disp: 30 tablet, Rfl: 0 .  lidocaine-prilocaine (EMLA) cream, Apply 1 application topically as needed. Apply small amount to port site at least 1 hour prior to it being accessed, cover with plastic wrap (Patient not taking: Reported on 10/31/2019), Disp: 30 g, Rfl: 1 .  LORazepam (ATIVAN) 0.5 MG tablet, Take 1 tablet (0.5 mg total) by mouth every 6 (six) hours as needed for anxiety. (Patient not taking: Reported on 10/31/2019), Disp: 30 tablet, Rfl: 0 .  ondansetron (ZOFRAN) 4 MG tablet, Take 1 tablet (4 mg total) by mouth every 6 (six) hours as needed for nausea or vomiting., Disp: 60 tablet, Rfl: 0 .  oxyCODONE (OXY IR/ROXICODONE) 5 MG immediate release  tablet, Take 1-2 tablets (5-10 mg total) by mouth every 4 (four) hours as needed for moderate pain., Disp: 120 tablet, Rfl: 0 .  oxyCODONE (OXYCONTIN) 10 mg 12 hr tablet, Take 1 tablet (10 mg total) by mouth every 12 (twelve) hours., Disp: 60 tablet, Rfl: 0 .  pantoprazole (PROTONIX) 40 MG tablet, Take 1 tablet (40 mg total) by mouth daily. (Patient not taking: Reported on 10/10/2019), Disp: 30 tablet, Rfl: 1 .  potassium chloride SA (KLOR-CON) 20 MEQ tablet, Take 1 tablet (20 mEq total) by mouth 2 (two) times daily., Disp: 28 tablet, Rfl: 0 .  senna-docusate (SENOKOT-S) 8.6-50 MG tablet, Take 1 tablet by mouth 2 (two) times daily., Disp: 30 tablet, Rfl: 1 .  sucralfate (CARAFATE) 1 g tablet, Take 1 tablet (1 g total) by mouth 4 (four) times daily -  with meals and at bedtime for 14 days. (Patient not taking: Reported on 10/17/2019),  Disp: 56 tablet, Rfl: 0 .  traZODone (DESYREL) 100 MG tablet, Take 1 tablet (100 mg total) by mouth at bedtime., Disp: 30 tablet, Rfl: 2  Physical exam:  Vitals:   11/21/19 0938  BP: 114/77  Pulse: 76  Resp: 16  Temp: (!) 96.7 F (35.9 C)  TempSrc: Tympanic  SpO2: 98%  Weight: 126 lb 14.4 oz (57.6 kg)   Physical Exam Cardiovascular:     Rate and Rhythm: Normal rate and regular rhythm.     Heart sounds: Normal heart sounds.  Pulmonary:     Effort: Pulmonary effort is normal.     Breath sounds: Normal breath sounds.  Abdominal:     General: Bowel sounds are normal.     Palpations: Abdomen is soft.  Musculoskeletal:     Comments: Focal tenderness to palpation over the left ninth and then ribs  Skin:    General: Skin is warm and dry.  Neurological:     Mental Status: She is alert and oriented to person, place, and time.      CMP Latest Ref Rng & Units 11/11/2019  Glucose 70 - 99 mg/dL 122(H)  BUN 6 - 20 mg/dL 8  Creatinine 0.44 - 1.00 mg/dL 0.51  Sodium 135 - 145 mmol/L 136  Potassium 3.5 - 5.1 mmol/L 3.2(L)  Chloride 98 - 111 mmol/L 105  CO2 22 -  32 mmol/L 23  Calcium 8.9 - 10.3 mg/dL 8.4(L)  Total Protein 6.5 - 8.1 g/dL 7.6  Total Bilirubin 0.3 - 1.2 mg/dL 0.6  Alkaline Phos 38 - 126 U/L 94  AST 15 - 41 U/L 35  ALT 0 - 44 U/L 36   CBC Latest Ref Rng & Units 11/11/2019  WBC 4.0 - 10.5 K/uL 6.1  Hemoglobin 12.0 - 15.0 g/dL 12.1  Hematocrit 36 - 46 % 36.6  Platelets 150 - 400 K/uL 425(H)     Assessment and plan- Patient is a 39 y.o. female with metastatic ER negative her 2 positive breast cancer on taxol/herceptin and perjeta. She is here for on treatment assessment prior to cycle 4-day fifteen of Taxol and for follow-up of following issues:  1.  Insomnia: She will continue to take trazodone 100 mg daily and okay for her to take one additional dose of 50 mg at night 2.  Depression: We will start her on low-dose Zoloft at 25 mg daily.  If she has any worsening symptoms of CNS depression I will stop her Zoloft.  Patient continues to have multiple physical complaints at this time 3.  Urinary frequency: We will check urinalysis and urine culture today 4.  Headache/dizziness: We will check MRI brain with and without contrast to rule out metastatic disease. 5.  Counts okay to proceed with cycle 4-day fifteen of Taxol today.  She will return in 1 week to get cycle 5-day one of Taxol Herceptin and Perjeta along with Xgeva for bone metastases I will see her in 2 weeks for Taxol alone.  Plan to repeat scans after completing five cycles 6.  Diarrhea: Likely secondary to Herceptin Perjeta: Continue as needed Lomotil.  We will get echocardiogram prior to her next visit with me. 7.  Neoplasm related pain: Continue OxyContin and as needed oxycodone   Visit Diagnosis  1. Painful urination   2. Bone metastases ([place])   3. Primary cancer of left breast with metastasis to other site ([place])   4. Encounter for antineoplastic chemotherapy   5. Neoplasm related pain   6. Depression, unspecified  depression type   7. Long term current use of  bisphosphonates      Dr. Randa Evens, MD, MPH [facility] [phone] 11/21/2019 8:42 AM

## 2019-11-21 NOTE — Telephone Encounter (Signed)
Spoke with patient and let her know her results, let her know that she is being prescribed bactrim ds for 7 days. Confirmed pharmacy with patient

## 2019-11-21 NOTE — Telephone Encounter (Signed)
-----   Message from Sindy Guadeloupe, MD sent at 11/21/2019 11:37 AM EDT ----- Please give her bactrim ds for 7days

## 2019-11-28 ENCOUNTER — Inpatient Hospital Stay: Payer: Self-pay

## 2019-11-28 ENCOUNTER — Other Ambulatory Visit: Payer: Self-pay | Admitting: *Deleted

## 2019-11-28 ENCOUNTER — Other Ambulatory Visit: Payer: Self-pay

## 2019-11-28 VITALS — BP 121/76 | HR 95 | Temp 98.5°F | Resp 17 | Wt 126.5 lb

## 2019-11-28 DIAGNOSIS — M8440XA Pathological fracture, unspecified site, initial encounter for fracture: Secondary | ICD-10-CM

## 2019-11-28 DIAGNOSIS — C50919 Malignant neoplasm of unspecified site of unspecified female breast: Secondary | ICD-10-CM

## 2019-11-28 LAB — CBC WITH DIFFERENTIAL/PLATELET
Abs Immature Granulocytes: 0.07 10*3/uL (ref 0.00–0.07)
Basophils Absolute: 0 10*3/uL (ref 0.0–0.1)
Basophils Relative: 1 %
Eosinophils Absolute: 0.2 10*3/uL (ref 0.0–0.5)
Eosinophils Relative: 4 %
HCT: 33.3 % — ABNORMAL LOW (ref 36.0–46.0)
Hemoglobin: 11 g/dL — ABNORMAL LOW (ref 12.0–15.0)
Immature Granulocytes: 1 %
Lymphocytes Relative: 19 %
Lymphs Abs: 1 10*3/uL (ref 0.7–4.0)
MCH: 28.3 pg (ref 26.0–34.0)
MCHC: 33 g/dL (ref 30.0–36.0)
MCV: 85.6 fL (ref 80.0–100.0)
Monocytes Absolute: 0.3 10*3/uL (ref 0.1–1.0)
Monocytes Relative: 6 %
Neutro Abs: 3.8 10*3/uL (ref 1.7–7.7)
Neutrophils Relative %: 69 %
Platelets: 399 10*3/uL (ref 150–400)
RBC: 3.89 MIL/uL (ref 3.87–5.11)
RDW: 15.8 % — ABNORMAL HIGH (ref 11.5–15.5)
WBC: 5.4 10*3/uL (ref 4.0–10.5)
nRBC: 0 % (ref 0.0–0.2)

## 2019-11-28 LAB — COMPREHENSIVE METABOLIC PANEL
ALT: 28 U/L (ref 0–44)
AST: 32 U/L (ref 15–41)
Albumin: 3.5 g/dL (ref 3.5–5.0)
Alkaline Phosphatase: 81 U/L (ref 38–126)
Anion gap: 10 (ref 5–15)
BUN: 8 mg/dL (ref 6–20)
CO2: 23 mmol/L (ref 22–32)
Calcium: 8.2 mg/dL — ABNORMAL LOW (ref 8.9–10.3)
Chloride: 105 mmol/L (ref 98–111)
Creatinine, Ser: 0.4 mg/dL — ABNORMAL LOW (ref 0.44–1.00)
GFR calc Af Amer: >60 mL/min (ref >60)
GFR calc non Af Amer: >60 mL/min (ref >60)
Glucose, Bld: 143 mg/dL — ABNORMAL HIGH (ref 70–99)
Potassium: 3.6 mmol/L (ref 3.5–5.1)
Sodium: 138 mmol/L (ref 135–145)
Total Bilirubin: 0.4 mg/dL (ref 0.3–1.2)
Total Protein: 6.9 g/dL (ref 6.5–8.1)

## 2019-11-28 MED ORDER — FAMOTIDINE IN NACL 20-0.9 MG/50ML-% IV SOLN
20.0000 mg | Freq: Once | INTRAVENOUS | Status: AC
  Administered 2019-11-28: 20 mg via INTRAVENOUS
  Filled 2019-11-28: qty 50

## 2019-11-28 MED ORDER — SODIUM CHLORIDE 0.9 % IV SOLN
Freq: Once | INTRAVENOUS | Status: AC
  Filled 2019-11-28: qty 250

## 2019-11-28 MED ORDER — DIPHENHYDRAMINE HCL 50 MG/ML IJ SOLN
50.0000 mg | Freq: Once | INTRAMUSCULAR | Status: AC
  Administered 2019-11-28: 50 mg via INTRAVENOUS
  Filled 2019-11-28: qty 1

## 2019-11-28 MED ORDER — HEPARIN SOD (PORK) LOCK FLUSH 100 UNIT/ML IV SOLN
500.0000 [IU] | Freq: Once | INTRAVENOUS | Status: AC | PRN
  Administered 2019-11-28: 500 [IU]
  Filled 2019-11-28: qty 5

## 2019-11-28 MED ORDER — DIPHENOXYLATE-ATROPINE 2.5-0.025 MG PO TABS: 1.0000 | ORAL_TABLET | Freq: Four times a day (QID) | ORAL | 0 refills | Status: DC | PRN

## 2019-11-28 MED ORDER — SODIUM CHLORIDE 0.9 % IV SOLN
65.0000 mg/m2 | Freq: Once | INTRAVENOUS | Status: AC
  Administered 2019-11-28: 108 mg via INTRAVENOUS
  Filled 2019-11-28: qty 18

## 2019-11-28 MED ORDER — SODIUM CHLORIDE 0.9 % IV SOLN
20.0000 mg | Freq: Once | INTRAVENOUS | Status: AC
  Administered 2019-11-28: 20 mg via INTRAVENOUS
  Filled 2019-11-28: qty 20

## 2019-11-28 MED ORDER — OXYCODONE HCL ER 10 MG PO T12A
10.0000 mg | EXTENDED_RELEASE_TABLET | Freq: Two times a day (BID) | ORAL | 0 refills | Status: DC
Start: 2019-11-28 — End: 2019-12-29

## 2019-11-28 MED ORDER — ACETAMINOPHEN 325 MG PO TABS
650.0000 mg | ORAL_TABLET | Freq: Once | ORAL | Status: AC
  Administered 2019-11-28: 650 mg via ORAL
  Filled 2019-11-28: qty 2

## 2019-11-28 MED ORDER — GABAPENTIN 300 MG PO CAPS
300.0000 mg | ORAL_CAPSULE | Freq: Two times a day (BID) | ORAL | 1 refills | Status: DC
Start: 2019-11-28 — End: 2020-02-20

## 2019-11-28 MED ORDER — SODIUM CHLORIDE 0.9 % IV SOLN
420.0000 mg | Freq: Once | INTRAVENOUS | Status: AC
  Administered 2019-11-28: 420 mg via INTRAVENOUS
  Filled 2019-11-28: qty 14

## 2019-11-28 MED ORDER — TRASTUZUMAB CHEMO 150 MG IV SOLR
6.0000 mg/kg | Freq: Once | INTRAVENOUS | Status: AC
  Administered 2019-11-28: 357 mg via INTRAVENOUS
  Filled 2019-11-28: qty 17

## 2019-12-02 ENCOUNTER — Other Ambulatory Visit: Payer: Self-pay

## 2019-12-02 ENCOUNTER — Ambulatory Visit
Admission: RE | Admit: 2019-12-02 | Discharge: 2019-12-02 | Disposition: A | Discharge status: Home / Self Care | Payer: Self-pay | Source: Ambulatory Visit | Attending: Oncology | Admitting: Oncology

## 2019-12-02 DIAGNOSIS — I517 Cardiomegaly: Secondary | ICD-10-CM | POA: Insufficient documentation

## 2019-12-02 DIAGNOSIS — Z5181 Encounter for therapeutic drug level monitoring: Secondary | ICD-10-CM | POA: Insufficient documentation

## 2019-12-02 DIAGNOSIS — Z5111 Encounter for antineoplastic chemotherapy: Secondary | ICD-10-CM

## 2019-12-02 DIAGNOSIS — F419 Anxiety disorder, unspecified: Secondary | ICD-10-CM | POA: Insufficient documentation

## 2019-12-02 DIAGNOSIS — C7951 Secondary malignant neoplasm of bone: Secondary | ICD-10-CM | POA: Insufficient documentation

## 2019-12-02 DIAGNOSIS — Z79899 Other long term (current) drug therapy: Secondary | ICD-10-CM | POA: Insufficient documentation

## 2019-12-02 DIAGNOSIS — C50912 Malignant neoplasm of unspecified site of left female breast: Secondary | ICD-10-CM | POA: Insufficient documentation

## 2019-12-02 LAB — ECHOCARDIOGRAM COMPLETE BUBBLE STUDY
AR max vel: 2.02 cm2
AV Area VTI: 2.26 cm2
AV Area mean vel: 1.79 cm2
AV Mean grad: 3 mmHg
AV Peak grad: 6 mmHg
Ao pk vel: 1.22 m/s
Area-P 1/2: 2.87 cm2
S' Lateral: 2.32 cm

## 2019-12-02 NOTE — Progress Notes (Signed)
*  PRELIMINARY RESULTS* Echocardiogram 2D Echocardiogram has been performed.  Sherrie Sport 12/02/2019, 11:09 AM

## 2019-12-05 ENCOUNTER — Inpatient Hospital Stay: Payer: Self-pay

## 2019-12-05 ENCOUNTER — Encounter: Payer: Self-pay | Admitting: Oncology

## 2019-12-05 ENCOUNTER — Inpatient Hospital Stay (HOSPITAL_BASED_OUTPATIENT_CLINIC_OR_DEPARTMENT_OTHER): Payer: Self-pay | Admitting: Oncology

## 2019-12-05 ENCOUNTER — Other Ambulatory Visit: Payer: Self-pay

## 2019-12-05 VITALS — BP 109/75 | HR 83 | Temp 96.8°F | Resp 16 | Wt 127.5 lb

## 2019-12-05 DIAGNOSIS — C7951 Secondary malignant neoplasm of bone: Secondary | ICD-10-CM

## 2019-12-05 DIAGNOSIS — Z5111 Encounter for antineoplastic chemotherapy: Secondary | ICD-10-CM

## 2019-12-05 DIAGNOSIS — C50919 Malignant neoplasm of unspecified site of unspecified female breast: Secondary | ICD-10-CM

## 2019-12-05 DIAGNOSIS — F32A Depression, unspecified: Secondary | ICD-10-CM

## 2019-12-05 DIAGNOSIS — M8440XA Pathological fracture, unspecified site, initial encounter for fracture: Secondary | ICD-10-CM

## 2019-12-05 DIAGNOSIS — Z7983 Long term (current) use of bisphosphonates: Secondary | ICD-10-CM

## 2019-12-05 DIAGNOSIS — G893 Neoplasm related pain (acute) (chronic): Secondary | ICD-10-CM

## 2019-12-05 DIAGNOSIS — F329 Major depressive disorder, single episode, unspecified: Secondary | ICD-10-CM

## 2019-12-05 LAB — COMPREHENSIVE METABOLIC PANEL
ALT: 35 U/L (ref 0–44)
AST: 38 U/L (ref 15–41)
Albumin: 3.5 g/dL (ref 3.5–5.0)
Alkaline Phosphatase: 74 U/L (ref 38–126)
Anion gap: 8 (ref 5–15)
BUN: 8 mg/dL (ref 6–20)
CO2: 24 mmol/L (ref 22–32)
Calcium: 8.7 mg/dL — ABNORMAL LOW (ref 8.9–10.3)
Chloride: 105 mmol/L (ref 98–111)
Creatinine, Ser: 0.51 mg/dL (ref 0.44–1.00)
GFR calc Af Amer: >60 mL/min (ref >60)
GFR calc non Af Amer: >60 mL/min (ref >60)
Glucose, Bld: 127 mg/dL — ABNORMAL HIGH (ref 70–99)
Potassium: 3.8 mmol/L (ref 3.5–5.1)
Sodium: 137 mmol/L (ref 135–145)
Total Bilirubin: 0.4 mg/dL (ref 0.3–1.2)
Total Protein: 6.7 g/dL (ref 6.5–8.1)

## 2019-12-05 LAB — CBC WITH DIFFERENTIAL/PLATELET
Abs Immature Granulocytes: 0.06 10*3/uL (ref 0.00–0.07)
Basophils Absolute: 0 10*3/uL (ref 0.0–0.1)
Basophils Relative: 1 %
Eosinophils Absolute: 0.1 10*3/uL (ref 0.0–0.5)
Eosinophils Relative: 2 %
HCT: 33.9 % — ABNORMAL LOW (ref 36.0–46.0)
Hemoglobin: 11.1 g/dL — ABNORMAL LOW (ref 12.0–15.0)
Immature Granulocytes: 1 %
Lymphocytes Relative: 16 %
Lymphs Abs: 1 10*3/uL (ref 0.7–4.0)
MCH: 28 pg (ref 26.0–34.0)
MCHC: 32.7 g/dL (ref 30.0–36.0)
MCV: 85.6 fL (ref 80.0–100.0)
Monocytes Absolute: 0.3 10*3/uL (ref 0.1–1.0)
Monocytes Relative: 5 %
Neutro Abs: 4.7 10*3/uL (ref 1.7–7.7)
Neutrophils Relative %: 75 %
Platelets: 392 10*3/uL (ref 150–400)
RBC: 3.96 MIL/uL (ref 3.87–5.11)
RDW: 15.9 % — ABNORMAL HIGH (ref 11.5–15.5)
WBC: 6.2 10*3/uL (ref 4.0–10.5)
nRBC: 0 % (ref 0.0–0.2)

## 2019-12-05 MED ORDER — SODIUM CHLORIDE 0.9 % IV SOLN
65.0000 mg/m2 | Freq: Once | INTRAVENOUS | Status: AC
  Administered 2019-12-05: 108 mg via INTRAVENOUS
  Filled 2019-12-05: qty 18

## 2019-12-05 MED ORDER — DENOSUMAB 120 MG/1.7ML ~~LOC~~ SOLN
120.0000 mg | Freq: Once | SUBCUTANEOUS | Status: AC
  Administered 2019-12-05: 120 mg via SUBCUTANEOUS
  Filled 2019-12-05: qty 1.7

## 2019-12-05 MED ORDER — DIPHENHYDRAMINE HCL 50 MG/ML IJ SOLN
50.0000 mg | Freq: Once | INTRAMUSCULAR | Status: AC
  Administered 2019-12-05: 50 mg via INTRAVENOUS
  Filled 2019-12-05: qty 1

## 2019-12-05 MED ORDER — FAMOTIDINE IN NACL 20-0.9 MG/50ML-% IV SOLN
20.0000 mg | Freq: Once | INTRAVENOUS | Status: AC
  Administered 2019-12-05: 20 mg via INTRAVENOUS
  Filled 2019-12-05: qty 50

## 2019-12-05 MED ORDER — HEPARIN SOD (PORK) LOCK FLUSH 100 UNIT/ML IV SOLN
500.0000 [IU] | Freq: Once | INTRAVENOUS | Status: AC | PRN
  Administered 2019-12-05: 500 [IU]
  Filled 2019-12-05: qty 5

## 2019-12-05 MED ORDER — HEPARIN SOD (PORK) LOCK FLUSH 100 UNIT/ML IV SOLN
INTRAVENOUS | Status: AC
  Filled 2019-12-05: qty 5

## 2019-12-05 MED ORDER — SODIUM CHLORIDE 0.9 % IV SOLN
Freq: Once | INTRAVENOUS | Status: AC
  Filled 2019-12-05: qty 250

## 2019-12-05 MED ORDER — SODIUM CHLORIDE 0.9 % IV SOLN
20.0000 mg | Freq: Once | INTRAVENOUS | Status: AC
  Administered 2019-12-05: 20 mg via INTRAVENOUS
  Filled 2019-12-05: qty 20

## 2019-12-05 MED ORDER — FLUOXETINE HCL 10 MG PO CAPS: 10.0000 mg | ORAL_CAPSULE | Freq: Every day | ORAL | 0 refills | Status: DC

## 2019-12-09 NOTE — Progress Notes (Signed)
.     Hematology/Oncology Consult note Sophia [name]  [phone] [phone]  Patient Care Team: [name], [place], NP as PCP - General Rico Junker, RN as Registered Nurse Theodore Demark, RN as Registered Nurse   Name of the patient: Sophia [name]  [id number]  03/05/1981   Date of visit: 12/09/19  Diagnosis- stage IV metastatic breast cancer ER/PR negative HER-2/neu positive with bone metastases  Chief complaint/ Reason for visit-on treatment assessment prior to cycle 5-day 8 of weekly Taxol chemotherapy  Heme/Onc history: patient is a 39 year old Hispanic female who is here with her friend. History obtained with the help of an interpreter.Patient self palpated left breast mass which was followed by a diagnostic bilateral mammogram. Mammogram showed 3.1 x 2.9 x 1.9 cm hypoechoic mass at the 1 o'clock position of the left breast. For abnormal cortically thickened left axillary lymph nodes measuring up to 5 mm. 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 Patient was also having ongoing back pain and was seen by [name] orthopedics Dr. Doyle Askew who ordered MRI lumbar spine without contrast which showed possible pathologic fractures of L1 and L4 vertebral bodies with greater than 50% height loss at L1 and abnormal signal involving L2-L3 S1 as well as right iliac bone concerning for metastatic disease.Patient is a single mother of 3 adult children and is very anxious today.She reports significant back pain which radiates to her bilateral thighs. Denies any focal tingling numbness or weakness. Denies any bowel bladder incontinence. Pain has been uncontrolled despite taking Tylenol. No prior history of abnormal breast biopsies. No family history of breast cancer  PET and MRIshowed 3 areas of pathologic fracture  of her spine as well as widespread bony metastatic disease and concern for impending fracture of the right hip. Given her worsening pain she was asked to come to the ER. She has been evaluated by Dr. Rudene Christians from orthopedic surgery and underwent kyphoplasty at 3 different levels.T6 L1 and L4 along with radiofrequency ablation.She also underwent prophylactic fixation of the right hip and not affected the sacral region.   Patient received first dose of Herceptin and Perjeta on 09/04/2019. Baseline echocardiogram normal.she is currently getting taxol/ herceptin/perjeta  Patient admitted to hospital for acute abdominal pain with CT findings concerning for acute colitis. Colonoscopy showed diffuse severe inflammation with erythema friability and loss of vascularity and shallow ulcerations in the IC valve, ascending colon and cecum.   Interval history- 1.  Patient was started on Zoloft for depression but states that it does not go well with her trazodone and she is unable to sleep at night if she takes Zoloft.  Because of that she has not taken it. 2.  Has occasional left flank pain for which she is using OxyContin and oxycodone and pain is relatively stable at this time 3.  Insomnia: Currently using trazodone 100 mg daily which is helping her.  ECOG PS- 1 Pain scale- 3 Opioid associated constipation- no  Review of systems- Review of Systems  Constitutional: Positive for malaise/fatigue. Negative for chills, fever and weight loss.  HENT: Negative for congestion, ear discharge and nosebleeds.   Eyes: Negative for blurred vision.  Respiratory: Negative for cough, hemoptysis, sputum production, shortness of breath and wheezing.   Cardiovascular: Negative for chest pain, palpitations, orthopnea and claudication.  Gastrointestinal: Negative for abdominal pain, blood in stool,  constipation, diarrhea, heartburn, melena, nausea and vomiting.  Genitourinary: Negative for dysuria, flank pain,  frequency, hematuria and urgency.  Musculoskeletal: Negative for back pain, joint pain and myalgias.  Skin: Negative for rash.  Neurological: Negative for dizziness, tingling, focal weakness, seizures, weakness and headaches.  Endo/Heme/Allergies: Does not bruise/bleed easily.  Psychiatric/Behavioral: Positive for depression. Negative for suicidal ideas. The patient is nervous/anxious and has insomnia.       No Known Allergies   Past Medical History:  Diagnosis Date  . Anxiety   . Breast cancer ([place])    with mets  . Cancer ([place])   . Colitis   . Vertigo      Past Surgical History:  Procedure Laterality Date  . BREAST BIOPSY Left 08/14/2019   [place] bx of mass, coil marker, path pending  . BREAST BIOPSY Left 08/14/2019   [place] bx of LN, hydromarker, path pending  . BREAST BIOPSY Left 08/14/2019   affirm bx of calcs, x marker, path pending  . ESOPHAGOGASTRODUODENOSCOPY (EGD) WITH PROPOFOL N/A 10/05/2019   Procedure: ESOPHAGOGASTRODUODENOSCOPY (EGD) WITH PROPOFOL;  Surgeon: Lin Landsman, MD;  Location: [facility];  Service: Gastroenterology;  Laterality: N/A;  . FLEXIBLE SIGMOIDOSCOPY N/A 10/05/2019   Procedure: FLEXIBLE SIGMOIDOSCOPY;  Surgeon: Lin Landsman, MD;  Location: [facility] END[facility];  Service: Gastroenterology;  Laterality: N/A;  . INTRAMEDULLARY (IM) NAIL INTERTROCHANTERIC Right 09/01/2019   Procedure: INTRAMEDULLARY (IM) NAIL INTERTROCHANTRIC AND RADIOFREQUENCY ABLATION;  Surgeon: Hessie Knows, MD;  Location: [facility] ORS;  Service: Orthopedics;  Laterality: Right;  . KYPHOPLASTY N/A 08/29/2019   Procedure: KYPHOPLASTY T6, L1,L4 ,  RADIOFREQUENCY ABLATION;  Surgeon: Hessie Knows, MD;  Location: [facility] ORS;  Service: Orthopedics;  Laterality: N/A;  . KYPHOPLASTY Right 09/01/2019   Procedure: Right Sacral Radiofrequency Ablation and Cement Augmentation, Right sacrum and iliac crest;  Surgeon: Hessie Knows, MD;  Location: [facility] OR[facility];  Service: Orthopedics;  Laterality:  Right;  . PORTA CATH INSERTION N/A 08/28/2019   Procedure: PORTA CATH INSERTION;  Surgeon: Algernon Huxley, MD;  Location: [facility];  Service: Cardiovascular;  Laterality: N/A;    Social History   Socioeconomic History  . Marital status: Single    Spouse name: Not on file  . Number of children: Not on file  . Years of education: Not on file  . Highest education level: Not on file  Occupational History  . Not on file  Tobacco Use  . Smoking status: Never Smoker  . Smokeless tobacco: Never Used  Vaping Use  . Vaping Use: Never used  Substance and Sexual Activity  . Alcohol use: Not Currently  . Drug use: Not Currently  . Sexual activity: Not Currently    Birth control/protection: None  Other Topics Concern  . Not on file  Social History Narrative   Lives at home with children   Social Determinants of Health   Financial Resource Strain:   . Difficulty of Paying Living Expenses: Not on file  Food Insecurity:   . Worried About Charity fundraiser in the Last Year: Not on file  . Ran Out of Food in the Last Year: Not on file  Transportation Needs:   . Lack of Transportation (Medical): Not on file  . Lack of Transportation (Non-Medical): Not on file  Physical Activity:   . Days of Exercise per Week: Not on file  . Minutes of Exercise per Session: Not on file  Stress:   . Feeling of Stress : Not on file  Social Connections:   .  Frequency of Communication with Friends and Family: Not on file  . Frequency of Social Gatherings with Friends and Family: Not on file  . Attends Religious Services: Not on file  . Active Member of Clubs or Organizations: Not on file  . Attends Archivist Meetings: Not on file  . Marital Status: Not on file  Intimate Partner Violence:   . Fear of Current or Ex-Partner: Not on file  . Emotionally Abused: Not on file  . Physically Abused: Not on file  . Sexually Abused: Not on file    Family History  Problem Relation Age of  Onset  . Colon cancer Maternal Uncle      Current Outpatient Medications:  .  gabapentin (NEURONTIN) 300 MG capsule, Take 1 capsule (300 mg total) by mouth 2 (two) times daily., Disp: 60 capsule, Rfl: 1 .  oxyCODONE (OXY IR/ROXICODONE) 5 MG immediate release tablet, Take 1-2 tablets (5-10 mg total) by mouth every 4 (four) hours as needed for moderate pain., Disp: 120 tablet, Rfl: 0 .  oxyCODONE (OXYCONTIN) 10 mg 12 hr tablet, Take 1 tablet (10 mg total) by mouth every 12 (twelve) hours., Disp: 60 tablet, Rfl: 0 .  traZODone (DESYREL) 100 MG tablet, Take 1 tablet (100 mg total) by mouth at bedtime., Disp: 30 tablet, Rfl: 2 .  clindamycin (CLINDAGEL) 1 % gel, Apply topically 2 (two) times daily. X 7 days. (Patient not taking: Reported on 10/31/2019), Disp: 30 g, Rfl: 0 .  diphenoxylate-atropine (LOMOTIL) 2.5-0.025 MG tablet, Take 1 tablet by mouth 4 (four) times daily as needed for diarrhea or loose stools. (Patient not taking: Reported on 12/05/2019), Disp: 30 tablet, Rfl: 0 .  ferrous sulfate 325 (65 FE) MG tablet, Take 1 tablet (325 mg total) by mouth daily with breakfast. (Patient not taking: Reported on 10/17/2019), Disp: 30 tablet, Rfl: 0 .  FLUoxetine (PROZAC) 10 MG capsule, Take 1 capsule (10 mg total) by mouth daily., Disp: 30 capsule, Rfl: 0 .  hydrOXYzine (ATARAX/VISTARIL) 10 MG tablet, Take 1 tablet (10 mg total) by mouth 3 (three) times daily as needed for anxiety. (Patient not taking: Reported on 10/10/2019), Disp: 30 tablet, Rfl: 0 .  lidocaine-prilocaine (EMLA) cream, Apply 1 application topically as needed. Apply small amount to port site at least 1 hour prior to it being accessed, cover with plastic wrap (Patient not taking: Reported on 10/31/2019), Disp: 30 g, Rfl: 1 .  LORazepam (ATIVAN) 0.5 MG tablet, Take 1 tablet (0.5 mg total) by mouth every 6 (six) hours as needed for anxiety. (Patient not taking: Reported on 10/31/2019), Disp: 30 tablet, Rfl: 0 .  ondansetron (ZOFRAN) 4 MG tablet,  Take 1 tablet (4 mg total) by mouth every 6 (six) hours as needed for nausea or vomiting. (Patient not taking: Reported on 12/05/2019), Disp: 60 tablet, Rfl: 0 .  pantoprazole (PROTONIX) 40 MG tablet, Take 1 tablet (40 mg total) by mouth daily. (Patient not taking: Reported on 10/10/2019), Disp: 30 tablet, Rfl: 1 .  potassium chloride SA (KLOR-CON) 20 MEQ tablet, Take 1 tablet (20 mEq total) by mouth 2 (two) times daily., Disp: 28 tablet, Rfl: 0 .  senna-docusate (SENOKOT-S) 8.6-50 MG tablet, Take 1 tablet by mouth 2 (two) times daily. (Patient not taking: Reported on 12/05/2019), Disp: 30 tablet, Rfl: 1 .  sucralfate (CARAFATE) 1 g tablet, Take 1 tablet (1 g total) by mouth 4 (four) times daily -  with meals and at bedtime for 14 days. (Patient not taking: Reported on 10/17/2019),  Disp: 56 tablet, Rfl: 0 .  sulfamethoxazole-trimethoprim (BACTRIM DS) 800-160 MG tablet, Take 1 tablet by mouth 2 (two) times daily. (Patient not taking: Reported on 12/05/2019), Disp: 14 tablet, Rfl: 0  Physical exam:  Vitals:   12/05/19 0903  BP: 109/75  Pulse: 83  Resp: 16  Temp: (!) 96.8 F (36 C)  TempSrc: Tympanic  SpO2: 100%  Weight: 127 lb 8 oz (57.8 kg)   Physical Exam Constitutional:      General: She is not in acute distress.    Comments: Appears anxious  Cardiovascular:     Rate and Rhythm: Normal rate and regular rhythm.     Heart sounds: Normal heart sounds.  Pulmonary:     Effort: Pulmonary effort is normal.     Breath sounds: Normal breath sounds.  Abdominal:     General: Bowel sounds are normal.     Palpations: Abdomen is soft.  Skin:    General: Skin is warm and dry.  Neurological:     Mental Status: She is alert and oriented to person, place, and time.      CMP Latest Ref Rng & Units 12/05/2019  Glucose 70 - 99 mg/dL 127(H)  BUN 6 - 20 mg/dL 8  Creatinine 0.44 - 1.00 mg/dL 0.51  Sodium 135 - 145 mmol/L 137  Potassium 3.5 - 5.1 mmol/L 3.8  Chloride 98 - 111 mmol/L 105  CO2 22 - 32  mmol/L 24  Calcium 8.9 - 10.3 mg/dL 8.7(L)  Total Protein 6.5 - 8.1 g/dL 6.7  Total Bilirubin 0.3 - 1.2 mg/dL 0.4  Alkaline Phos 38 - 126 U/L 74  AST 15 - 41 U/L 38  ALT 0 - 44 U/L 35   CBC Latest Ref Rng & Units 12/05/2019  WBC 4.0 - 10.5 K/uL 6.2  Hemoglobin 12.0 - 15.0 g/dL 11.1(L)  Hematocrit 36 - 46 % 33.9(L)  Platelets 150 - 400 K/uL 392    No images are attached to the encounter.  ECHOCARDIOGRAM COMPLETE BUBBLE STUDY  Result Date: 12/02/2019    ECHOCARDIOGRAM REPORT   Patient Name:   ELIANNE GUBSER [id number] Date of Exam: 12/02/2019 Medical Rec #:  [id number]             Height:       60.0 in Accession #:    [id number]            Weight:       126.5 lb Date of Birth:  09/14/1980             BSA:          1.536 m Patient Age:    36 years              BP:           121/76 mmHg Patient Gender: F                     HR:           96 bpm. Exam Location:  [facility] Procedure: 2D Echo, Cardiac Doppler, Color Doppler and Saline Contrast Bubble            Study Indications:     C 79.51 Bone metastases, Z51.11 Encounter for Antineoplastic                  chemotherapy  History:         Patient has prior history of Echocardiogram examinations, most  recent 08/26/2019. Anxiety, breast cancer.  Sonographer:     Sherrie Sport RDCS (AE) Referring Phys:  [id number] Weston Anna  Diagnosing Phys: [place] Gave End MD IMPRESSIONS  1. Left ventricular ejection fraction, by estimation, is 55 to 60%. The left ventricle has normal function. The left ventricle has no regional wall motion abnormalities. There is mild left ventricular hypertrophy of the septal and basal-septal segments.  Left ventricular diastolic parameters are consistent with Grade I diastolic dysfunction (impaired relaxation).  2. Right ventricular systolic function is normal. The right ventricular size is normal. Tricuspid regurgitation signal is inadequate for assessing PA pressure.  3. The mitral valve is normal in structure. Trivial mitral valve  regurgitation. No evidence of mitral stenosis.  4. The aortic valve is tricuspid. Aortic valve regurgitation is not visualized. No aortic stenosis is present.  5. Bubble study was non-diagnostic. FINDINGS  Left Ventricle: Left ventricular ejection fraction, by estimation, is 55 to 60%. The left ventricle has normal function. The left ventricle has no regional wall motion abnormalities. The left ventricular internal cavity size was normal in size. There is  mild left ventricular hypertrophy of the septal and basal-septal segments. Left ventricular diastolic parameters are consistent with Grade I diastolic dysfunction (impaired relaxation). Right Ventricle: The right ventricular size is normal. No increase in right ventricular wall thickness. Right ventricular systolic function is normal. Tricuspid regurgitation signal is inadequate for assessing PA pressure. Left Atrium: Left atrial size was normal in size. Right Atrium: Right atrial size was normal in size. Pericardium: The pericardium was not well visualized. Mitral Valve: The mitral valve is normal in structure. Trivial mitral valve regurgitation. No evidence of mitral valve stenosis. Tricuspid Valve: The tricuspid valve is normal in structure. Tricuspid valve regurgitation is trivial. Aortic Valve: The aortic valve is tricuspid. Aortic valve regurgitation is not visualized. No aortic stenosis is present. Aortic valve mean gradient measures 3.0 mmHg. Aortic valve peak gradient measures 6.0 mmHg. Aortic valve area, by VTI measures 2.26 cm. Pulmonic Valve: The pulmonic valve was normal in structure. Pulmonic valve regurgitation is trivial. No evidence of pulmonic stenosis. Aorta: The aortic root is normal in size and structure. Pulmonary Artery: The pulmonary artery is of normal size. Venous: The inferior vena cava was not well visualized. IAS/Shunts: The interatrial septum was not well visualized. Agitated saline contrast was given intravenously to evaluate for  intracardiac shunting. Bubble study was non-diagnostic.  LEFT VENTRICLE PLAX 2D LVIDd:         3.88 cm  Diastology LVIDs:         2.32 cm  LV e' medial:    5.87 cm/s LV PW:         0.83 cm  LV E/e' medial:  11.1 LV IVS:        1.11 cm  LV e' lateral:   4.24 cm/s LVOT diam:     2.00 cm  LV E/e' lateral: 15.3 LV SV:         52 LV SV Index:   34 LVOT Area:     3.14 cm  RIGHT VENTRICLE RV Basal diam:  3.05 cm RV S prime:     11.70 cm/s TAPSE (M-mode): 2.8 cm LEFT ATRIUM           Index       RIGHT ATRIUM           Index LA diam:      3.20 cm 2.08 cm/m  RA Area:     10.80 cm LA Vol (  A4C): 34.6 ml 22.52 ml/m RA Volume:   21.10 ml  13.73 ml/m  AORTIC VALVE                   PULMONIC VALVE AV Area (Vmax):    2.02 cm    PV Vmax:        0.79 m/s AV Area (Vmean):   1.79 cm    PV Peak grad:   2.5 mmHg AV Area (VTI):     2.26 cm    RVOT Peak grad: 4 mmHg AV Vmax:           122.00 cm/s AV Vmean:          84.000 cm/s AV VTI:            0.229 m AV Peak Grad:      6.0 mmHg AV Mean Grad:      3.0 mmHg LVOT Vmax:         78.30 cm/s LVOT Vmean:        47.900 cm/s LVOT VTI:          0.165 m LVOT/AV VTI ratio: 0.72  AORTA Ao Root diam: 2.80 cm MITRAL VALVE MV Area (PHT): 2.87 cm    SHUNTS MV Decel Time: 264 msec    Systemic VTI:  0.16 m MV E velocity: 65.00 cm/s  Systemic Diam: 2.00 cm MV A velocity: 84.90 cm/s MV E/A ratio:  0.77 Harrell Gave End MD Electronically signed by Nelva Bush MD Signature Date/Time: 12/02/2019/4:16:39 PM    Final      Assessment and plan- Patient is a 39 y.o. female with metastatic ER negative her 2 positive breast cancer on taxol/herceptin and perjeta.   She is here for on treatment assessment prior to cycle 5-day 8 of weekly Taxol  1.  Counts okay to proceed with cycle 5-day 8 of weekly Taxol chemotherapy today.  She will directly proceed with Taxol next week day 15 and I will see her back in 2 weeks for cycle 6-day 1 of Taxol Herceptin and Perjeta.  Tumor markers have now normalized and I  will plan to get PET CT scan after completing 5 cycles.  Recent echocardiogram was normal  2.  Insomnia: Continue trazodone 100 mg daily  3.  Depression: Patient did not want to take Zoloft as she felt it did not go away with trazodone and she was unable to sleep because of that.  I will switch her to Prozac instead  4.  Bone metastases: Patient will receive her Delton See today  I will also have her follow-up with palliative care NP Altha Harm given her overall symptom burden mainly attributed to her depression and anxiety  Visit Diagnosis 1. Metastatic breast cancer ([place])   2. Bone metastases ([place])   3. Encounter for antineoplastic chemotherapy   4. Neoplasm related pain   5. Depression, unspecified depression type   6. Long term (current) use of bisphosphonates      Dr. Randa Evens, MD, MPH [name] [id number] 12/09/2019 8:19 AM

## 2019-12-10 ENCOUNTER — Ambulatory Visit
Admission: RE | Admit: 2019-12-10 | Discharge: 2019-12-10 | Disposition: A | Discharge status: Home / Self Care | Payer: Self-pay | Source: Ambulatory Visit | Attending: Oncology | Admitting: Oncology

## 2019-12-10 ENCOUNTER — Other Ambulatory Visit: Payer: Self-pay

## 2019-12-10 DIAGNOSIS — R309 Painful micturition, unspecified: Secondary | ICD-10-CM | POA: Insufficient documentation

## 2019-12-10 IMAGING — MR MR HEAD WO/W CM
14 series · 43 of 48 positions shown · IV contrast (gadavist)
Comparison: Comparison made with prior brain MRI from [DATE] as
well as previous cervical spine MRI from [DATE].

CLINICAL DATA: Follow-up evaluation for metastatic breast cancer,
surveillance.

EXAM:
MRI HEAD WITHOUT AND WITH CONTRAST
TECHNIQUE: Multiplanar, multiecho pulse sequences of the brain and surrounding
structures were obtained without and with intravenous contrast.
CONTRAST:  5mL GADAVIST GADOBUTROL 1 MMOL/ML IV SOLN

[Series 5: ax dwi_tracew · axial · 3.0mm · 0.60mm/px · z∈[-125,+29]mm · 4 of 48 slices shown]
[im 1/48]
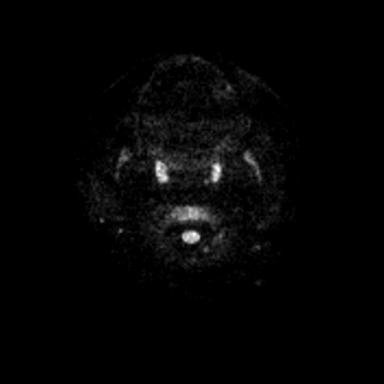
[im 16/48]
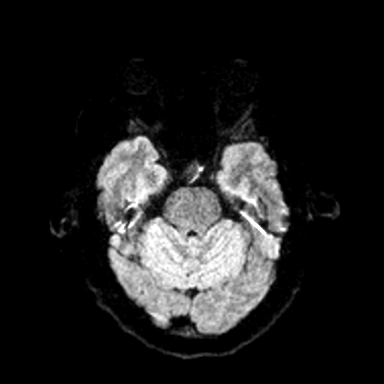
[im 32/48]
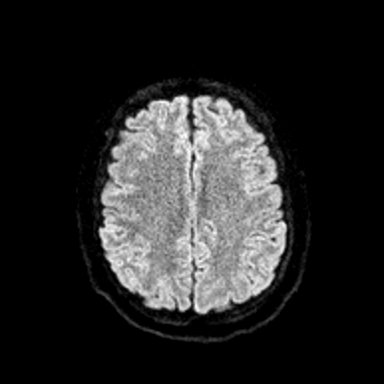
[im 48/48]
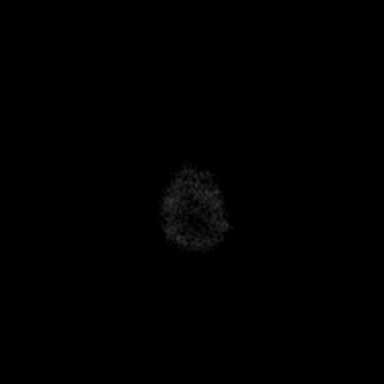

[Series 6: ax dwi_adc · axial · 3.0mm · 0.60mm/px · z∈[-125,+29]mm · 4 of 48 slices shown]
[im 1/48]
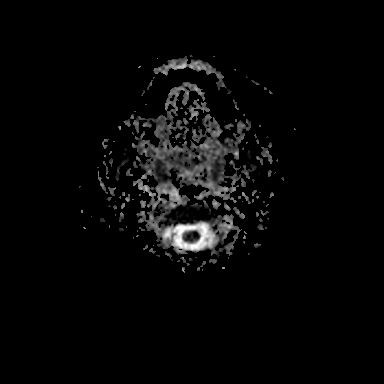
[im 16/48]
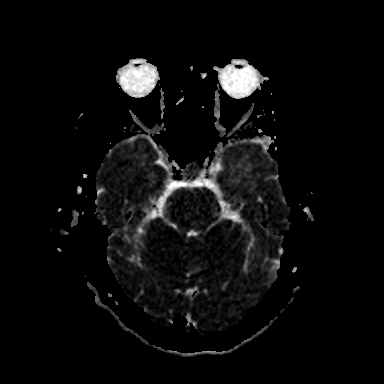
[im 32/48]
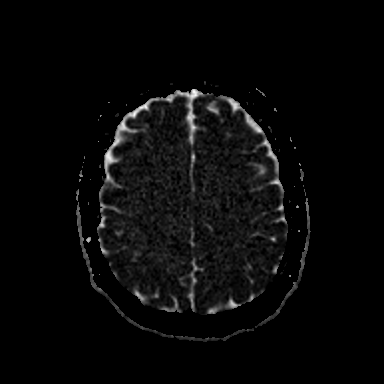
[im 48/48]
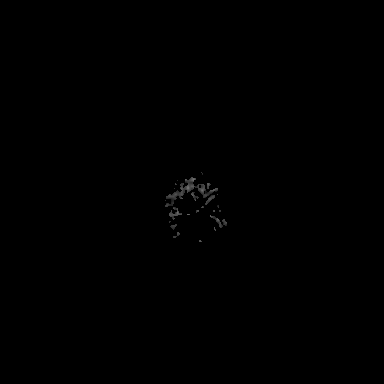

[Series 7: cor dwi_tracew · coronal · 5.0mm · 0.60mm/px · 2 of 40 slices shown]
[im 1/40]
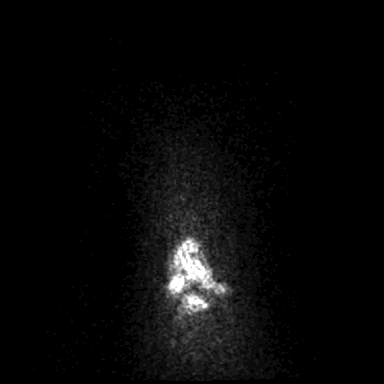
[im 40/40]
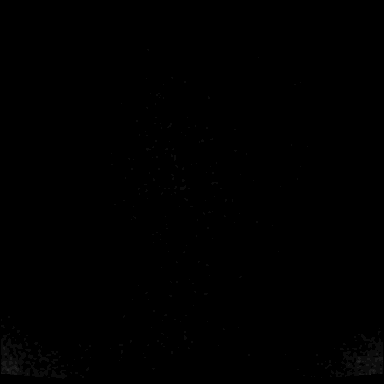

[Series 8: cor dwi_adc · coronal · 5.0mm · 0.60mm/px · 2 of 38 slices shown]
[im 1/38]
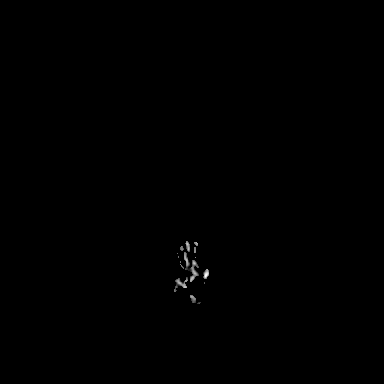
[im 38/38]
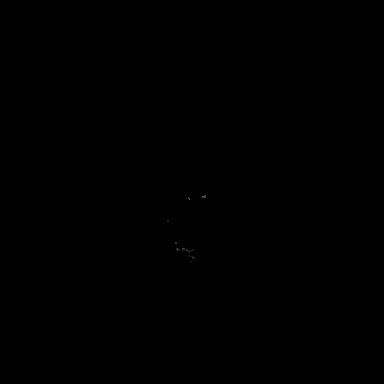

[Series 9: T1 · sagittal · 5.0mm · 0.62mm/px · 1 of 25 slices shown (1 of 2)]
[im 1/25]
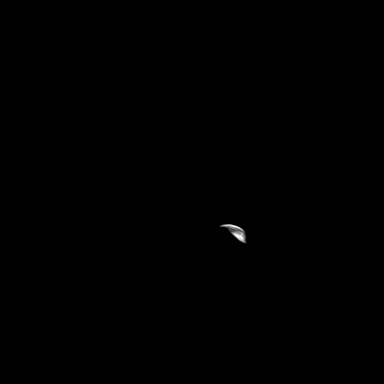

[Series 10: T2 · axial · 5.0mm · 0.53mm/px · 1 of 25 slices shown]
[im 1/25]
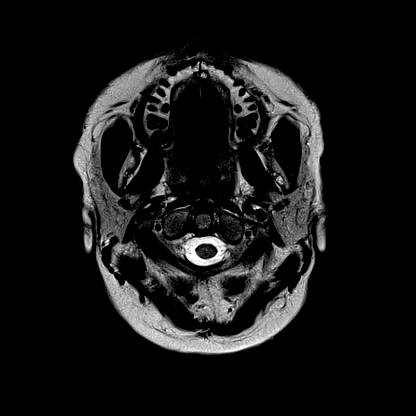

[Series 12: pha_images · axial · 3.0mm · 0.90mm/px · z∈[-133,+39]mm · 3 of 58 slices shown]
[im 1/58]
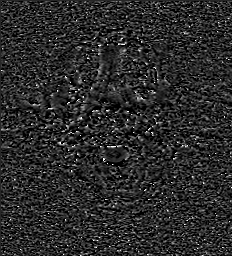
[im 29/58]
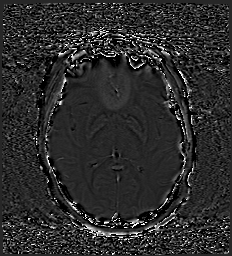
[im 58/58]
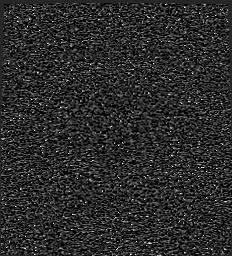

[Series 13: swi_images · axial · 3.0mm · 0.90mm/px · z∈[-136,-50]mm · 2 of 60 slices shown]
[im 1/60]
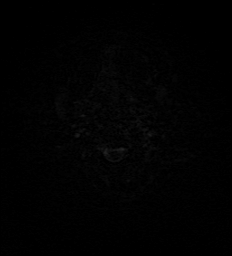
[im 30/60]
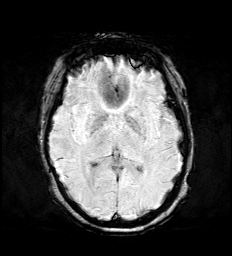

[Series 15: FLAIR · axial · 3.0mm · 0.53mm/px · z∈[-128,+33]mm · 3 of 55 slices shown]
[im 1/55]
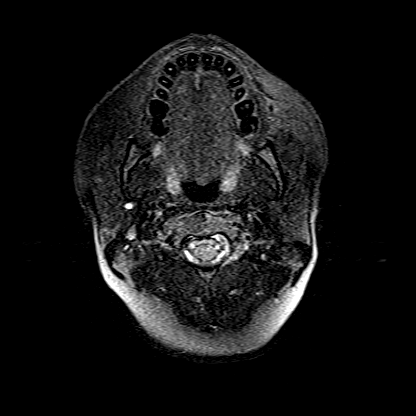
[im 28/55]
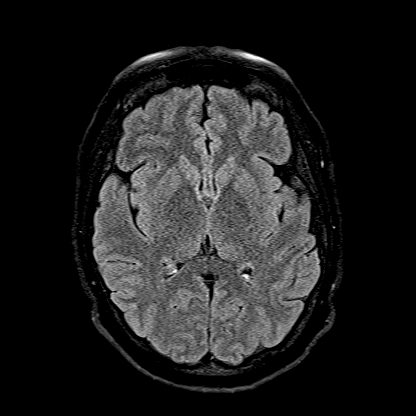
[im 55/55]
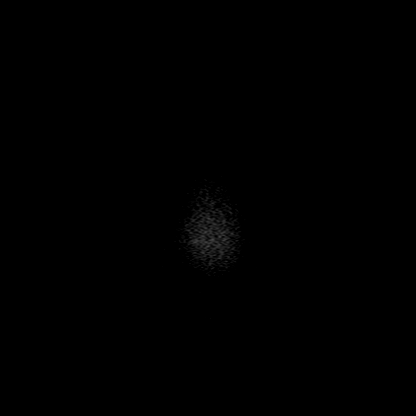

[Series 16: T1 · axial · 1.0mm · 0.98mm/px · z∈[-136,+37]mm · 8 of 176 slices shown (2 of 2)]
[im 1/176]
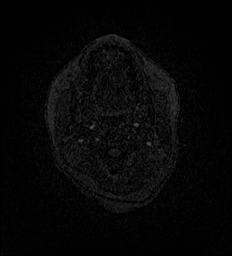
[im 20/176]
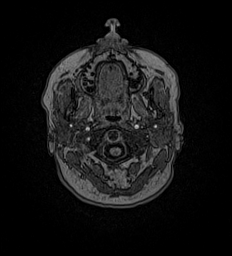
[im 59/176]
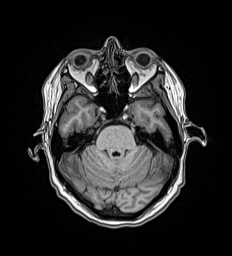
[im 78/176]
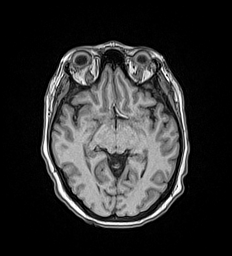
[im 98/176]
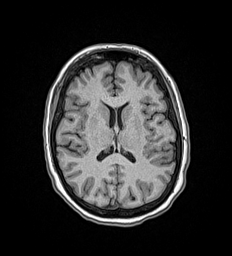
[im 117/176]
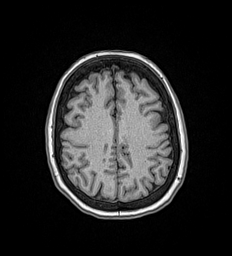
[im 156/176]
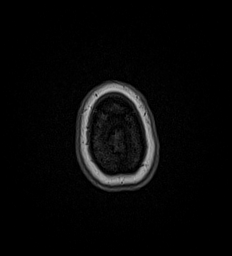
[im 176/176]
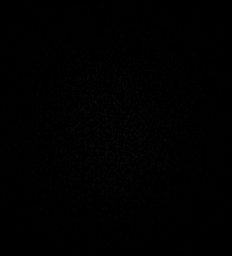

[Series 17: T2 post-contrast · coronal · 5.0mm · 0.57mm/px · 2 of 29 slices shown]
[im 1/29]
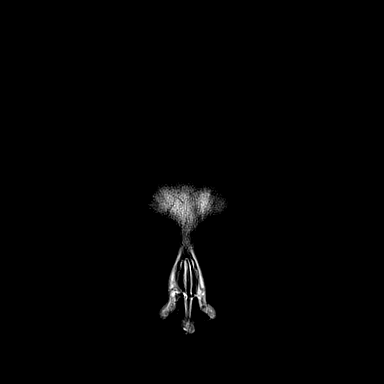
[im 29/29]
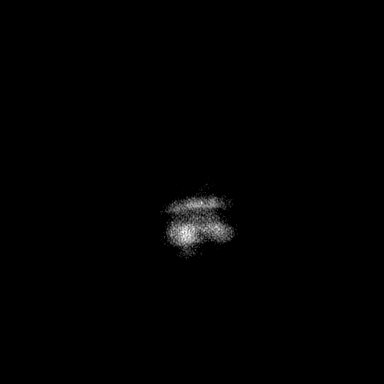

[Series 18: T1 post-contrast · axial · 1.0mm · 0.98mm/px · z∈[-136,+37]mm · 8 of 176 slices shown (1 of 3)]
[im 1/176]
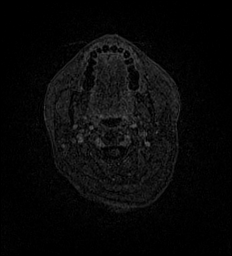
[im 20/176]
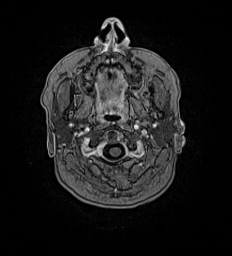
[im 59/176]
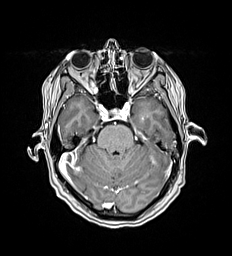
[im 78/176]
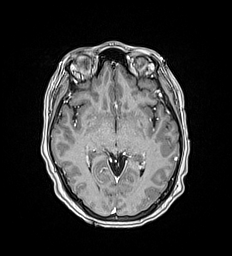
[im 98/176]
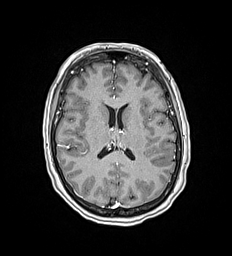
[im 117/176]
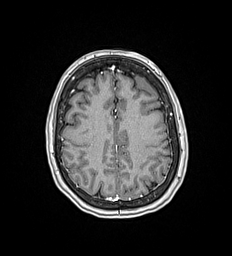
[im 156/176]
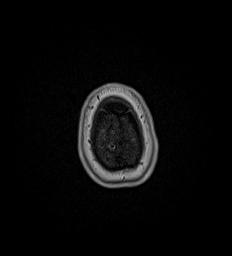
[im 176/176]
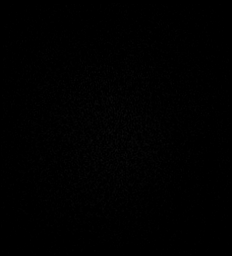

[Series 19: T1 post-contrast · coronal · 5.0mm · 0.57mm/px · 2 of 29 slices shown (2 of 3)]
[im 1/29]
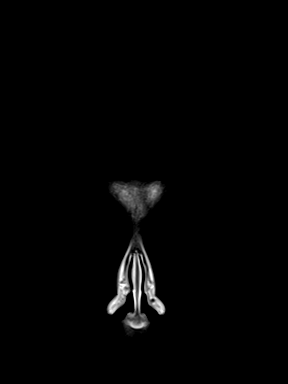
[im 29/29]
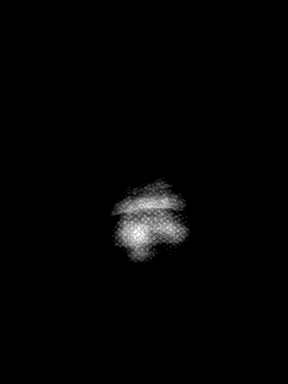

[Series 20: T1 post-contrast · sagittal · 5.0mm · 0.62mm/px · 1 of 25 slices shown (3 of 3)]
[im 1/25]
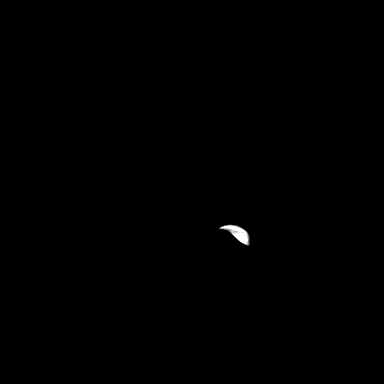

[43 of 48 positions shown; findings below may reference images not displayed]

FINDINGS: Brain: Cerebral volume within normal limits for patient age. No
focal parenchymal signal abnormality identified.

No abnormal foci of restricted diffusion to suggest acute or
subacute ischemia. Gray-white matter differentiation well
maintained. No encephalomalacia to suggest chronic infarction. No
foci of susceptibility artifact to suggest acute or chronic
intracranial hemorrhage.

No mass lesion, midline shift or mass effect. No hydrocephalus.
Incidental note made of a 12 mm arachnoid cyst at the anterior left
middle cranial fossa (series 10, image 10), of doubtful
significance. No other extra-axial fluid collection. Major dural
sinuses are grossly patent.

Pituitary gland and suprasellar region are normal. Midline
structures intact and normal.

No abnormal enhancement within the brain itself.

Vascular: Major intracranial vascular flow voids well maintained and
normal in appearance.

Skull and upper cervical spine: Craniocervical junction within
normal limits. Known metastatic lesion involving the dens again
seen, measuring 12 mm (series 9, image 12). Previously seen
metastasis involving the left occipital condyle not well seen on
today's exam, but grossly similar. There is a 1 cm enhancing lesion
at the left frontal calvarium, most likely reflecting an osseous
metastasis (series 18, image 131). No extra osseous extension. No
other definite focal area of placing lesions.

Sinuses/Orbits: Globes and orbital soft tissues within normal
limits.

Mild scattered mucosal thickening noted within the ethmoidal air
cells. Paranasal sinuses are otherwise clear. No mastoid effusion.
Inner ear structures grossly normal.

Other: None.
IMPRESSION: 1. 1 cm enhancing lesion at the left frontal calvarium, consistent
with an osseous metastasis. In retrospect, finding was likely
present on prior MRI from [DATE], although is slightly more
prominent on today's study.
2. Additional osseous metastases involving the dens and left
occipital condyle, similar to previous cervical spine MRI.
3. Otherwise normal brain MRI, with no other intracranial metastatic
disease identified.

## 2019-12-10 MED ORDER — GADOBUTROL 1 MMOL/ML IV SOLN
5.0000 mL | Freq: Once | INTRAVENOUS | Status: AC | PRN
  Administered 2019-12-10: 5 mL via INTRAVENOUS

## 2019-12-12 ENCOUNTER — Inpatient Hospital Stay: Payer: Self-pay

## 2019-12-12 ENCOUNTER — Inpatient Hospital Stay (HOSPITAL_BASED_OUTPATIENT_CLINIC_OR_DEPARTMENT_OTHER): Payer: Self-pay | Admitting: Hospice and Palliative Medicine

## 2019-12-12 ENCOUNTER — Other Ambulatory Visit: Payer: Self-pay

## 2019-12-12 ENCOUNTER — Ambulatory Visit: Payer: Self-pay

## 2019-12-12 VITALS — BP 105/72 | HR 82 | Temp 98.0°F | Resp 17 | Wt 127.4 lb

## 2019-12-12 DIAGNOSIS — C50919 Malignant neoplasm of unspecified site of unspecified female breast: Secondary | ICD-10-CM

## 2019-12-12 DIAGNOSIS — F339 Major depressive disorder, recurrent, unspecified: Secondary | ICD-10-CM

## 2019-12-12 DIAGNOSIS — Z95828 Presence of other vascular implants and grafts: Secondary | ICD-10-CM

## 2019-12-12 DIAGNOSIS — Z515 Encounter for palliative care: Secondary | ICD-10-CM

## 2019-12-12 DIAGNOSIS — M8440XA Pathological fracture, unspecified site, initial encounter for fracture: Secondary | ICD-10-CM

## 2019-12-12 LAB — CBC WITH DIFFERENTIAL/PLATELET
Abs Immature Granulocytes: 0.05 10*3/uL (ref 0.00–0.07)
Basophils Absolute: 0 10*3/uL (ref 0.0–0.1)
Basophils Relative: 1 %
Eosinophils Absolute: 0.1 10*3/uL (ref 0.0–0.5)
Eosinophils Relative: 2 %
HCT: 33.8 % — ABNORMAL LOW (ref 36.0–46.0)
Hemoglobin: 11.2 g/dL — ABNORMAL LOW (ref 12.0–15.0)
Immature Granulocytes: 1 %
Lymphocytes Relative: 17 %
Lymphs Abs: 1 10*3/uL (ref 0.7–4.0)
MCH: 28.2 pg (ref 26.0–34.0)
MCHC: 33.1 g/dL (ref 30.0–36.0)
MCV: 85.1 fL (ref 80.0–100.0)
Monocytes Absolute: 0.3 10*3/uL (ref 0.1–1.0)
Monocytes Relative: 6 %
Neutro Abs: 4.3 10*3/uL (ref 1.7–7.7)
Neutrophils Relative %: 73 %
Platelets: 395 10*3/uL (ref 150–400)
RBC: 3.97 MIL/uL (ref 3.87–5.11)
RDW: 16.1 % — ABNORMAL HIGH (ref 11.5–15.5)
WBC: 5.8 10*3/uL (ref 4.0–10.5)
nRBC: 0 % (ref 0.0–0.2)

## 2019-12-12 LAB — COMPREHENSIVE METABOLIC PANEL
ALT: 48 U/L — ABNORMAL HIGH (ref 0–44)
AST: 47 U/L — ABNORMAL HIGH (ref 15–41)
Albumin: 3.7 g/dL (ref 3.5–5.0)
Alkaline Phosphatase: 74 U/L (ref 38–126)
Anion gap: 6 (ref 5–15)
BUN: 9 mg/dL (ref 6–20)
CO2: 24 mmol/L (ref 22–32)
Calcium: 8.6 mg/dL — ABNORMAL LOW (ref 8.9–10.3)
Chloride: 107 mmol/L (ref 98–111)
Creatinine, Ser: 0.58 mg/dL (ref 0.44–1.00)
GFR calc Af Amer: >60 mL/min (ref >60)
GFR calc non Af Amer: >60 mL/min (ref >60)
Glucose, Bld: 130 mg/dL — ABNORMAL HIGH (ref 70–99)
Potassium: 3.6 mmol/L (ref 3.5–5.1)
Sodium: 137 mmol/L (ref 135–145)
Total Bilirubin: 0.4 mg/dL (ref 0.3–1.2)
Total Protein: 6.7 g/dL (ref 6.5–8.1)

## 2019-12-12 MED ORDER — SODIUM CHLORIDE 0.9 % IV SOLN
Freq: Once | INTRAVENOUS | Status: AC
  Filled 2019-12-12: qty 250

## 2019-12-12 MED ORDER — HEPARIN SOD (PORK) LOCK FLUSH 100 UNIT/ML IV SOLN
500.0000 [IU] | Freq: Once | INTRAVENOUS | Status: DC | PRN
  Filled 2019-12-12: qty 5

## 2019-12-12 MED ORDER — HEPARIN SOD (PORK) LOCK FLUSH 100 UNIT/ML IV SOLN
500.0000 [IU] | Freq: Once | INTRAVENOUS | Status: AC
  Administered 2019-12-12: 500 [IU] via INTRAVENOUS
  Filled 2019-12-12: qty 5

## 2019-12-12 MED ORDER — HEPARIN SOD (PORK) LOCK FLUSH 100 UNIT/ML IV SOLN
INTRAVENOUS | Status: AC
  Filled 2019-12-12: qty 5

## 2019-12-12 MED ORDER — SODIUM CHLORIDE 0.9% FLUSH
10.0000 mL | Freq: Once | INTRAVENOUS | Status: AC
  Administered 2019-12-12: 10 mL via INTRAVENOUS
  Filled 2019-12-12: qty 10

## 2019-12-12 MED ORDER — SODIUM CHLORIDE 0.9 % IV SOLN
20.0000 mg | Freq: Once | INTRAVENOUS | Status: AC
  Administered 2019-12-12: 20 mg via INTRAVENOUS
  Filled 2019-12-12: qty 20

## 2019-12-12 MED ORDER — DIPHENHYDRAMINE HCL 50 MG/ML IJ SOLN
INTRAMUSCULAR | Status: AC
  Filled 2019-12-12: qty 1

## 2019-12-12 MED ORDER — SODIUM CHLORIDE 0.9 % IV SOLN
65.0000 mg/m2 | Freq: Once | INTRAVENOUS | Status: AC
  Administered 2019-12-12: 108 mg via INTRAVENOUS
  Filled 2019-12-12: qty 18

## 2019-12-12 MED ORDER — DIPHENHYDRAMINE HCL 50 MG/ML IJ SOLN
50.0000 mg | Freq: Once | INTRAMUSCULAR | Status: AC
  Administered 2019-12-12: 50 mg via INTRAVENOUS

## 2019-12-12 MED ORDER — FAMOTIDINE IN NACL 20-0.9 MG/50ML-% IV SOLN
20.0000 mg | Freq: Once | INTRAVENOUS | Status: AC
  Administered 2019-12-12: 20 mg via INTRAVENOUS

## 2019-12-12 NOTE — Addendum Note (Signed)
Addended by: Altha Harm R on: 12/12/2019 12:53 PM   Modules accepted: Level of Service

## 2019-12-12 NOTE — Progress Notes (Signed)
[place]  [phone] [phone]   Name: Sophia [name] Date: 12/12/2019 MRN: [id number]  DOB: June 18, 1980  Patient Care Team: [facility], [place], NP as PCP - General Rico Junker, RN as Registered Nurse Theodore Demark, RN as Registered Nurse    REASON FOR CONSULTATION: Sophia [name] is a 39 y.o. 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  Patient has history of pathologic fracture to spine status post kyphoplasty.  She also underwent prophylactic fixation of the right hip due to impending fracture.  She has had significant depression and anxiety.  She was referred to palliative care to help address goals and manage ongoing symptoms.  SOCIAL HISTORY:     reports that she has never smoked. She has never used smokeless tobacco. She reports previous alcohol use. She reports previous drug use.  Patient is from [place].  She speaks [place].  She lives here with her 3 children ages 52-15.  She has no family or friend/social support locally.  She is unable to work.  ADVANCE DIRECTIVES:  Does not have  CODE STATUS:   PAST MEDICAL HISTORY: Past Medical History:  Diagnosis Date  . Anxiety   . Breast cancer ([place])    with mets  . Cancer ([place])   . Colitis   . Vertigo     PAST SURGICAL HISTORY:  Past Surgical History:  Procedure Laterality Date  . BREAST BIOPSY Left 08/14/2019   [place] bx of mass, coil marker, path pending  . BREAST BIOPSY Left 08/14/2019   [place] bx of LN, hydromarker, path pending  . BREAST BIOPSY Left 08/14/2019   affirm bx of calcs, x marker, path pending  . ESOPHAGOGASTRODUODENOSCOPY (EGD) WITH PROPOFOL N/A 10/05/2019   Procedure: ESOPHAGOGASTRODUODENOSCOPY (EGD) WITH PROPOFOL;  Surgeon: Lin Landsman, MD;  Location: [facility];  Service: Gastroenterology;  Laterality: N/A;  .  FLEXIBLE SIGMOIDOSCOPY N/A 10/05/2019   Procedure: FLEXIBLE SIGMOIDOSCOPY;  Surgeon: Lin Landsman, MD;  Location: [facility] ENDOSCOPY;  Service: Gastroenterology;  Laterality: N/A;  . INTRAMEDULLARY (IM) NAIL INTERTROCHANTERIC Right 09/01/2019   Procedure: INTRAMEDULLARY (IM) NAIL INTERTROCHANTRIC AND RADIOFREQUENCY ABLATION;  Surgeon: Hessie Knows, MD;  Location: [facility] ORS;  Service: Orthopedics;  Laterality: Right;  . KYPHOPLASTY N/A 08/29/2019   Procedure: KYPHOPLASTY T6, L1,L4 ,  RADIOFREQUENCY ABLATION;  Surgeon: Hessie Knows, MD;  Location: [facility] ORS;  Service: Orthopedics;  Laterality: N/A;  . KYPHOPLASTY Right 09/01/2019   Procedure: Right Sacral Radiofrequency Ablation and Cement Augmentation, Right sacrum and iliac crest;  Surgeon: Hessie Knows, MD;  Location: [facility] ORS;  Service: Orthopedics;  Laterality: Right;  . PORTA CATH INSERTION N/A 08/28/2019   Procedure: PORTA CATH INSERTION;  Surgeon: Algernon Huxley, MD;  Location: [place] [facility];  Service: Cardiovascular;  Laterality: N/A;    HEMATOLOGY/ONCOLOGY HISTORY:  Oncology History  Metastatic breast cancer ([place])  08/29/2019 Initial Diagnosis   Metastatic breast cancer ([place])   09/04/2019 -  Chemotherapy   The patient had trastuzumab (HERCEPTIN) 450 mg in sodium chloride 0.9 % 250 mL chemo infusion, 483 mg (100 % of original dose 8 mg/kg), Intravenous,  Once, 5 of 6 cycles Dose modification: 8 mg/kg (original dose 8 mg/kg, Cycle 1, Reason: Other (see comments), Comment: loading dose), 6 mg/kg (original dose 6 mg/kg, Cycle 2, Reason: Other (see comments), Comment: per md) Administration: 450 mg (09/04/2019), 357 mg (09/26/2019), 357 mg (10/17/2019), 357 mg (  11/28/2019), 357 mg (11/07/2019) PACLitaxel (TAXOL) 132 mg in sodium chloride 0.9 % 250 mL chemo infusion (</= 22m/m2), 80 mg/m2 = 132 mg, Intravenous,  Once, 5 of 6 cycles Dose modification: 65 mg/m2 (original dose 80 mg/m2, Cycle 3, Reason: Dose not tolerated) Administration: 132 mg  (09/11/2019), 132 mg (09/26/2019), 108 mg (10/17/2019), 108 mg (10/24/2019), 108 mg (11/28/2019), 108 mg (12/05/2019), 132 mg (09/19/2019), 108 mg (11/07/2019), 108 mg (10/31/2019), 108 mg (11/14/2019), 108 mg (11/21/2019), 108 mg (12/12/2019) pertuzumab (PERJETA) 840 mg in sodium chloride 0.9 % 250 mL chemo infusion, 840 mg, Intravenous, Once, 5 of 6 cycles Administration: 840 mg (09/04/2019), 420 mg (09/26/2019), 420 mg (10/17/2019), 420 mg (11/28/2019), 420 mg (11/07/2019) trastuzumab-dkst (OGIVRI) 483 mg in sodium chloride 0.9 % 250 mL chemo infusion, 8 mg/kg = 483 mg, Intravenous,  Once, 1 of 1 cycle  for chemotherapy treatment.      ALLERGIES:  has No Known Allergies.  MEDICATIONS:  Current Outpatient Medications  Medication Sig Dispense Refill  . clindamycin (CLINDAGEL) 1 % gel Apply topically 2 (two) times daily. X 7 days. (Patient not taking: Reported on 10/31/2019) 30 g 0  . diphenoxylate-atropine (LOMOTIL) 2.5-0.025 MG tablet Take 1 tablet by mouth 4 (four) times daily as needed for diarrhea or loose stools. (Patient not taking: Reported on 12/05/2019) 30 tablet 0  . ferrous sulfate 325 (65 FE) MG tablet Take 1 tablet (325 mg total) by mouth daily with breakfast. (Patient not taking: Reported on 10/17/2019) 30 tablet 0  . FLUoxetine (PROZAC) 10 MG capsule Take 1 capsule (10 mg total) by mouth daily. 30 capsule 0  . gabapentin (NEURONTIN) 300 MG capsule Take 1 capsule (300 mg total) by mouth 2 (two) times daily. 60 capsule 1  . hydrOXYzine (ATARAX/VISTARIL) 10 MG tablet Take 1 tablet (10 mg total) by mouth 3 (three) times daily as needed for anxiety. (Patient not taking: Reported on 10/10/2019) 30 tablet 0  . lidocaine-prilocaine (EMLA) cream Apply 1 application topically as needed. Apply small amount to port site at least 1 hour prior to it being accessed, cover with plastic wrap (Patient not taking: Reported on 10/31/2019) 30 g 1  . LORazepam (ATIVAN) 0.5 MG tablet Take 1 tablet (0.5 mg total) by mouth every 6  (six) hours as needed for anxiety. (Patient not taking: Reported on 10/31/2019) 30 tablet 0  . ondansetron (ZOFRAN) 4 MG tablet Take 1 tablet (4 mg total) by mouth every 6 (six) hours as needed for nausea or vomiting. (Patient not taking: Reported on 12/05/2019) 60 tablet 0  . oxyCODONE (OXY IR/ROXICODONE) 5 MG immediate release tablet Take 1-2 tablets (5-10 mg total) by mouth every 4 (four) hours as needed for moderate pain. 120 tablet 0  . oxyCODONE (OXYCONTIN) 10 mg 12 hr tablet Take 1 tablet (10 mg total) by mouth every 12 (twelve) hours. 60 tablet 0  . pantoprazole (PROTONIX) 40 MG tablet Take 1 tablet (40 mg total) by mouth daily. (Patient not taking: Reported on 10/10/2019) 30 tablet 1  . potassium chloride SA (KLOR-CON) 20 MEQ tablet Take 1 tablet (20 mEq total) by mouth 2 (two) times daily. 28 tablet 0  . senna-docusate (SENOKOT-S) 8.6-50 MG tablet Take 1 tablet by mouth 2 (two) times daily. (Patient not taking: Reported on 12/05/2019) 30 tablet 1  . sucralfate (CARAFATE) 1 g tablet Take 1 tablet (1 g total) by mouth 4 (four) times daily -  with meals and at bedtime for 14 days. (Patient not taking: Reported on 10/17/2019) 56  tablet 0  . sulfamethoxazole-trimethoprim (BACTRIM DS) 800-160 MG tablet Take 1 tablet by mouth 2 (two) times daily. (Patient not taking: Reported on 12/05/2019) 14 tablet 0  . traZODone (DESYREL) 100 MG tablet Take 1 tablet (100 mg total) by mouth at bedtime. 30 tablet 2   Current Facility-Administered Medications  Medication Dose Route Frequency Provider Last Rate Last Admin  . heparin lock flush 100 unit/mL  500 Units Intracatheter Once PRN Sindy Guadeloupe, MD        VITAL SIGNS: BP 105/72 (Patient Position: Sitting)   Pulse 82   Temp 98 F (36.7 C) (Tympanic)   Resp 17   Wt 127 lb 6 oz (57.8 kg)   BMI 24.88 kg/m  Filed Weights   12/12/19 0915  Weight: 127 lb 6 oz (57.8 kg)    Estimated body mass index is 24.88 kg/m as calculated from the following:   Height  as of 10/24/19: 5' (1.524 m).   Weight as of this encounter: 127 lb 6 oz (57.8 kg).  LABS: CBC:    Component Value Date/Time   WBC 5.8 12/12/2019 0904   HGB 11.2 (L) 12/12/2019 0904   HCT 33.8 (L) 12/12/2019 0904   PLT 395 12/12/2019 0904   MCV 85.1 12/12/2019 0904   NEUTROABS 4.3 12/12/2019 0904   LYMPHSABS 1.0 12/12/2019 0904   MONOABS 0.3 12/12/2019 0904   EOSABS 0.1 12/12/2019 0904   BASOSABS 0.0 12/12/2019 0904   Comprehensive Metabolic Panel:    Component Value Date/Time   NA 137 12/12/2019 0904   K 3.6 12/12/2019 0904   CL 107 12/12/2019 0904   CO2 24 12/12/2019 0904   BUN 9 12/12/2019 0904   CREATININE 0.58 12/12/2019 0904   GLUCOSE 130 (H) 12/12/2019 0904   CALCIUM 8.6 (L) 12/12/2019 0904   AST 47 (H) 12/12/2019 0904   ALT 48 (H) 12/12/2019 0904   ALKPHOS 74 12/12/2019 0904   BILITOT 0.4 12/12/2019 0904   PROT 6.7 12/12/2019 0904   ALBUMIN 3.7 12/12/2019 0904    RADIOGRAPHIC STUDIES: MR Brain W Wo Contrast  Result Date: 12/11/2019 CLINICAL DATA:  Follow-up evaluation for metastatic breast cancer, surveillance. EXAM: MRI HEAD WITHOUT AND WITH CONTRAST TECHNIQUE: Multiplanar, multiecho pulse sequences of the brain and surrounding structures were obtained without and with intravenous contrast. CONTRAST:  34m GADAVIST GADOBUTROL 1 MMOL/ML IV SOLN COMPARISON:  Comparison made with prior brain MRI from 08/19/2019 as well as previous cervical spine MRI from 08/25/2019. FINDINGS: Brain: Cerebral volume within normal limits for patient age. No focal parenchymal signal abnormality identified. No abnormal foci of restricted diffusion to suggest acute or subacute ischemia. Gray-white matter differentiation well maintained. No encephalomalacia to suggest chronic infarction. No foci of susceptibility artifact to suggest acute or chronic intracranial hemorrhage. No mass lesion, midline shift or mass effect. No hydrocephalus. Incidental note made of a 12 mm arachnoid cyst at the  anterior left middle cranial fossa (series 10, image 10), of doubtful significance. No other extra-axial fluid collection. Major dural sinuses are grossly patent. Pituitary gland and suprasellar region are normal. Midline structures intact and normal. No abnormal enhancement within the brain itself. Vascular: Major intracranial vascular flow voids well maintained and normal in appearance. Skull and upper cervical spine: Craniocervical junction within normal limits. Known metastatic lesion involving the dens again seen, measuring 12 mm (series 9, image 12). Previously seen metastasis involving the left occipital condyle not well seen on today's exam, but grossly similar. There is a 1 cm enhancing lesion  at the left frontal calvarium, most likely reflecting an osseous metastasis (series 18, image 131). No extra osseous extension. No other definite focal area of placing lesions. Sinuses/Orbits: Globes and orbital soft tissues within normal limits. Mild scattered mucosal thickening noted within the ethmoidal air cells. Paranasal sinuses are otherwise clear. No mastoid effusion. Inner ear structures grossly normal. Other: None. IMPRESSION: 1. 1 cm enhancing lesion at the left frontal calvarium, consistent with an osseous metastasis. In retrospect, finding was likely present on prior MRI from 08/29/2019, although is slightly more prominent on today's study. 2. Additional osseous metastases involving the dens and left occipital condyle, similar to previous cervical spine MRI. 3. Otherwise normal brain MRI, with no other intracranial metastatic disease identified. Electronically Signed   By: Jeannine Boga M.D.   On: 12/11/2019 03:40   ECHOCARDIOGRAM COMPLETE BUBBLE STUDY  Result Date: 12/02/2019    ECHOCARDIOGRAM REPORT   Patient Name:   JANDA CARGO FFMBWGY Date of Exam: 12/02/2019 Medical Rec #:  [id number]             Height:       60.0 in Accession #:    [id number]            Weight:       126.5 lb Date of  Birth:  07/12/1980             BSA:          1.536 m Patient Age:    53 years              BP:           121/76 mmHg Patient Gender: F                     HR:           96 bpm. Exam Location:  [facility] Procedure: 2D Echo, Cardiac Doppler, Color Doppler and Saline Contrast Bubble            Study Indications:     C 79.51 Bone metastases, Z51.11 Encounter for Antineoplastic                  chemotherapy  History:         Patient has prior history of Echocardiogram examinations, most                  recent 08/26/2019. Anxiety, breast cancer.  Sonographer:     Sherrie Sport RDCS (AE) Referring Phys:  [id number] Weston Anna RAO Diagnosing Phys: Harrell Gave End MD IMPRESSIONS  1. Left ventricular ejection fraction, by estimation, is 55 to 60%. The left ventricle has normal function. The left ventricle has no regional wall motion abnormalities. There is mild left ventricular hypertrophy of the septal and basal-septal segments.  Left ventricular diastolic parameters are consistent with Grade I diastolic dysfunction (impaired relaxation).  2. Right ventricular systolic function is normal. The right ventricular size is normal. Tricuspid regurgitation signal is inadequate for assessing PA pressure.  3. The mitral valve is normal in structure. Trivial mitral valve regurgitation. No evidence of mitral stenosis.  4. The aortic valve is tricuspid. Aortic valve regurgitation is not visualized. No aortic stenosis is present.  5. Bubble study was non-diagnostic. FINDINGS  Left Ventricle: Left ventricular ejection fraction, by estimation, is 55 to 60%. The left ventricle has normal function. The left ventricle has no regional wall motion abnormalities. The left ventricular internal cavity size was normal in size. There is  mild  left ventricular hypertrophy of the septal and basal-septal segments. Left ventricular diastolic parameters are consistent with Grade I diastolic dysfunction (impaired relaxation). Right Ventricle: The right ventricular  size is normal. No increase in right ventricular wall thickness. Right ventricular systolic function is normal. Tricuspid regurgitation signal is inadequate for assessing PA pressure. Left Atrium: Left atrial size was normal in size. Right Atrium: Right atrial size was normal in size. Pericardium: The pericardium was not well visualized. Mitral Valve: The mitral valve is normal in structure. Trivial mitral valve regurgitation. No evidence of mitral valve stenosis. Tricuspid Valve: The tricuspid valve is normal in structure. Tricuspid valve regurgitation is trivial. Aortic Valve: The aortic valve is tricuspid. Aortic valve regurgitation is not visualized. No aortic stenosis is present. Aortic valve mean gradient measures 3.0 mmHg. Aortic valve peak gradient measures 6.0 mmHg. Aortic valve area, by VTI measures 2.26 cm. Pulmonic Valve: The pulmonic valve was normal in structure. Pulmonic valve regurgitation is trivial. No evidence of pulmonic stenosis. Aorta: The aortic root is normal in size and structure. Pulmonary Artery: The pulmonary artery is of normal size. Venous: The inferior vena cava was not well visualized. IAS/Shunts: The interatrial septum was not well visualized. Agitated saline contrast was given intravenously to evaluate for intracardiac shunting. Bubble study was non-diagnostic.  LEFT VENTRICLE PLAX 2D LVIDd:         3.88 cm  Diastology LVIDs:         2.32 cm  LV e' medial:    5.87 cm/s LV PW:         0.83 cm  LV E/e' medial:  11.1 LV IVS:        1.11 cm  LV e' lateral:   4.24 cm/s LVOT diam:     2.00 cm  LV E/e' lateral: 15.3 LV SV:         52 LV SV Index:   34 LVOT Area:     3.14 cm  RIGHT VENTRICLE RV Basal diam:  3.05 cm RV S prime:     11.70 cm/s TAPSE (M-mode): 2.8 cm LEFT ATRIUM           Index       RIGHT ATRIUM           Index LA diam:      3.20 cm 2.08 cm/m  RA Area:     10.80 cm LA Vol (A4C): 34.6 ml 22.52 ml/m RA Volume:   21.10 ml  13.73 ml/m  AORTIC VALVE                    PULMONIC VALVE AV Area (Vmax):    2.02 cm    PV Vmax:        0.79 m/s AV Area (Vmean):   1.79 cm    PV Peak grad:   2.5 mmHg AV Area (VTI):     2.26 cm    RVOT Peak grad: 4 mmHg AV Vmax:           122.00 cm/s AV Vmean:          84.000 cm/s AV VTI:            0.229 m AV Peak Grad:      6.0 mmHg AV Mean Grad:      3.0 mmHg LVOT Vmax:         78.30 cm/s LVOT Vmean:        47.900 cm/s LVOT VTI:  0.165 m LVOT/AV VTI ratio: 0.72  AORTA Ao Root diam: 2.80 cm MITRAL VALVE MV Area (PHT): 2.87 cm    SHUNTS MV Decel Time: 264 msec    Systemic VTI:  0.16 m MV E velocity: 65.00 cm/s  Systemic Diam: 2.00 cm MV A velocity: 84.90 cm/s MV E/A ratio:  0.77 Harrell Gave End MD Electronically signed by Nelva Bush MD Signature Date/Time: 12/02/2019/4:16:39 PM    Final     PERFORMANCE STATUS (ECOG) : 1 - Symptomatic but completely ambulatory  Review of Systems Unless otherwise noted, a complete review of systems is negative.  Physical Exam General: NAD Pulmonary: Unlabored Extremities: no edema, no joint deformities Skin: no rashes Neurological: Weakness but otherwise nonfocal  IMPRESSION: I met with patient in the infusion area with assistance of an interpreter.  Patient says that her primary issue is her anxiety and depression.  She feels like she is having frequent "panic attacks."  She also says that she frequently cries throughout the day.  She denies SI/HI.  She has history of chronic depression and was previously seeing a therapist at [facility] in [place] but did not feel like she made a connection with her therapist and stopped going.  She would be interested in being referred to a new therapist.  Patient was recently switched from Zoloft to Prozac due to exacerbation of her insomnia.  However, patient says that she has not yet started the Prozac and has not picked it up from the pharmacy.  Discussed the recommendation for initiation of SSRI and explained that it may take weeks for  improvement in her symptoms.  She continues to take trazodone nightly for sleep.  Additionally, she is on gabapentin, which can also help anxiety.  Patient also takes lorazepam as needed.  Would recommend maximizing dosing of Prozac and could consider augmenting with olanzapine if needed.  Unfortunately, referral to psychiatry is not an option as patient is self-pay and does not have the financial resources.  This will also likely limit her options for counseling.  I called and spoke with Otelia Limes [phone] at [facility] who is a therapist that speaks [place]. Patient is on a sliding scale at [facility] and cannot afford to come in to the clinic but [name] agreed to call patient on the phone.   I spoke with patient about resources for [facility]. I also have called [facility] to see if they have a Spanish speaking grief counselor.   Also spoke with Barnabas Lister, SW about financial resources/support  PLAN: -Continue current scope of treatment -Start Prozac 10 mg daily -Continue trazodone, lorazepam, and gabapentin -Referral to counseling -Referral to SW -RTC in 2 weeks  Case and plan discussed with Dr. Janese Banks  Patient expressed understanding and was in agreement with this plan. She also understands that She can call the clinic at any time with any questions, concerns, or complaints.     Time Total: 45 minutes  Visit consisted of counseling and education dealing with the complex and emotionally intense issues of symptom management and palliative care in the setting of serious and potentially life-threatening illness.Greater than 50%  of this time was spent counseling and coordinating care related to the above assessment and plan.  Signed by: Altha Harm, PhD, NP-C

## 2019-12-15 ENCOUNTER — Encounter: Payer: Self-pay | Admitting: Radiology

## 2019-12-16 ENCOUNTER — Encounter
Admission: RE | Admit: 2019-12-16 | Discharge: 2019-12-16 | Disposition: A | Discharge status: Home / Self Care | Payer: Self-pay | Source: Ambulatory Visit | Attending: Oncology | Admitting: Oncology

## 2019-12-16 ENCOUNTER — Other Ambulatory Visit: Payer: Self-pay

## 2019-12-16 DIAGNOSIS — C7951 Secondary malignant neoplasm of bone: Secondary | ICD-10-CM | POA: Insufficient documentation

## 2019-12-16 DIAGNOSIS — C50919 Malignant neoplasm of unspecified site of unspecified female breast: Secondary | ICD-10-CM | POA: Insufficient documentation

## 2019-12-16 LAB — GLUCOSE, CAPILLARY: Glucose-Capillary: 86 mg/dL (ref 70–99)

## 2019-12-16 IMAGING — CT NM PET TUM IMG RESTAG (PS) SKULL BASE T - THIGH
9 series · 24 of 25 positions shown · non-contrast
Comparison: Abdomen/pelvis CT [DATE].  PET-CT [DATE].

CLINICAL DATA: Subsequent treatment strategy for breast cancer.

EXAM:
NUCLEAR MEDICINE PET SKULL BASE TO THIGH
TECHNIQUE: 6.8 mCi F-18 FDG was injected intravenously. Full-ring PET imaging
was performed from the skull base to thigh after the radiotracer. CT
data was obtained and used for attenuation correction and anatomic
localization.
Fasting blood glucose: 86 mg/dl

[Series 3: ct wb 5.0 b30f · axial · 5.0mm · 0.98mm/px · z∈[-138,+612]mm · 3 of 251 slices shown]
[im 1/251]
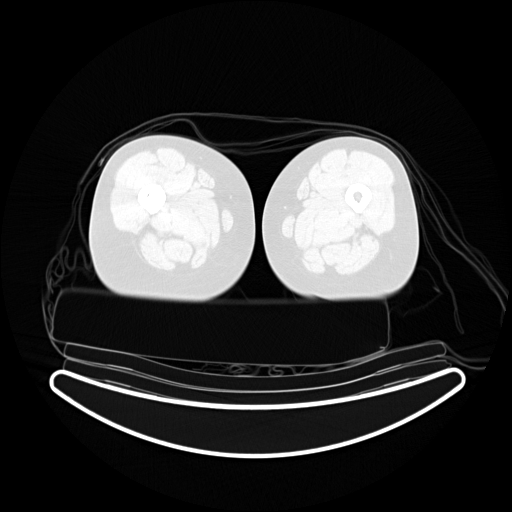
[im 126/251]
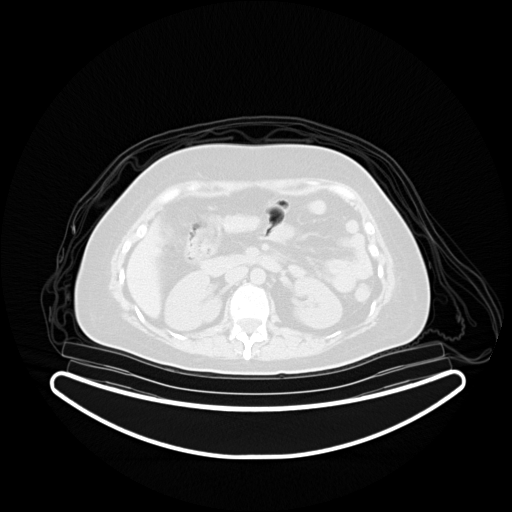
[im 251/251  brain]
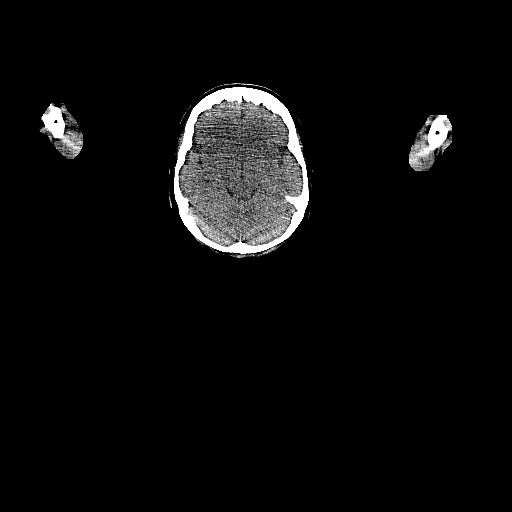

[Series 5: pet wb uncorrected (nac) · axial · 5.0mm · 4.07mm/px · z∈[-138,+612]mm · 4 of 251 slices shown]
[im 1/251]
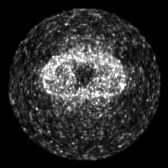
[im 84/251]
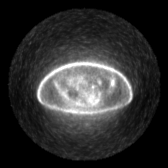
[im 167/251]
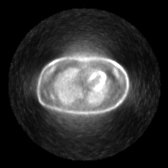
[im 251/251]
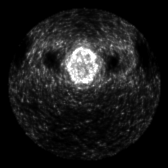

[Series 6: pet wb (ac) · axial · 5.0mm · 2.72mm/px · z∈[-138,+612]mm · 4 of 251 slices shown]
[im 1/251]
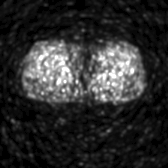
[im 84/251]
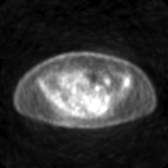
[im 167/251]
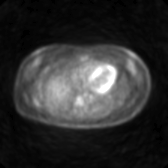
[im 251/251]
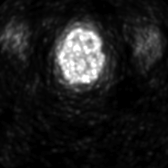

[Series 603: fused axial · 3 of 249 slices shown]
[im 1/249]
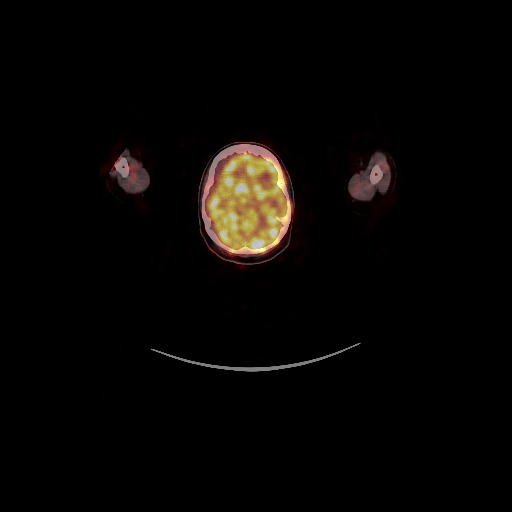
[im 166/249]
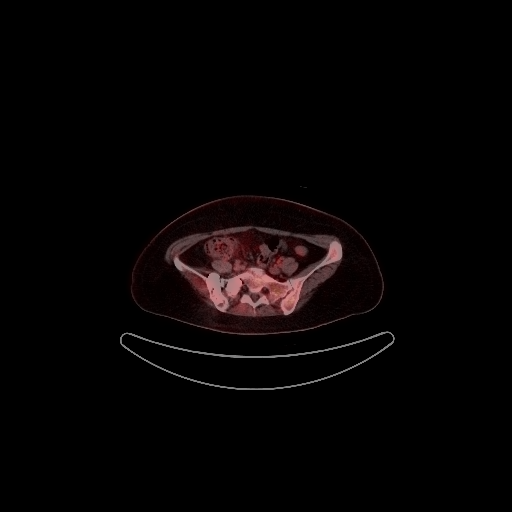
[im 249/249]
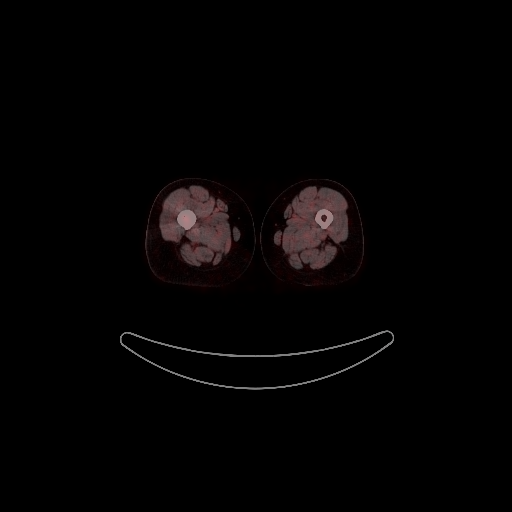

[Series 605: fused coronal · 1 of 83 slices shown]
[im 1/83]
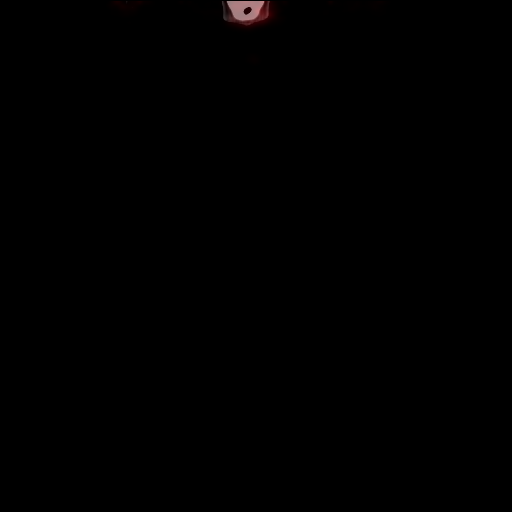

[Series 606: fused sagittal · 2 of 126 slices shown]
[im 1/126]
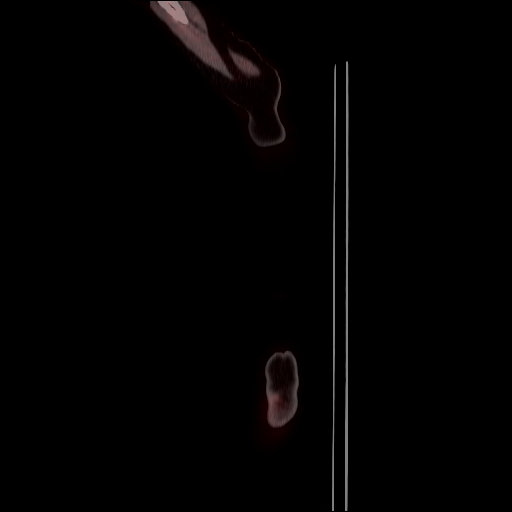
[im 126/126]
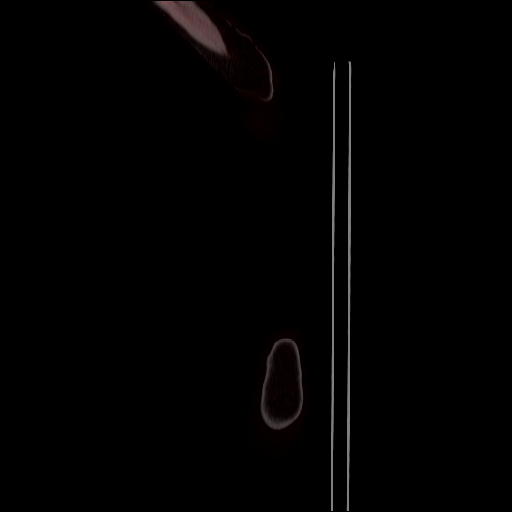

[Series 607: pet axial · 4 of 249 slices shown]
[im 1/249]
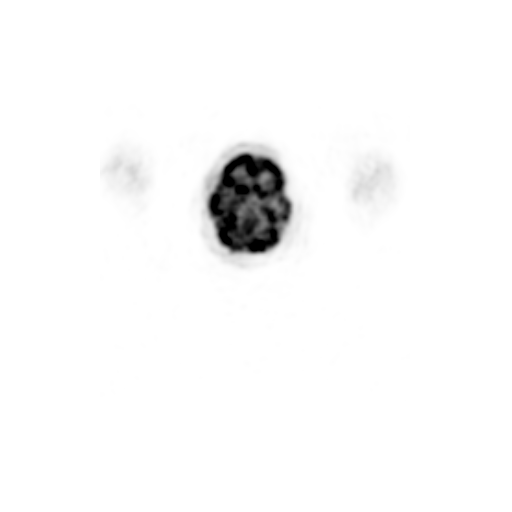
[im 83/249]
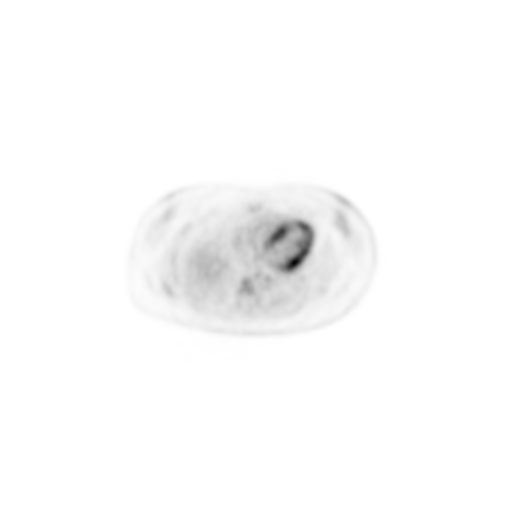
[im 166/249]
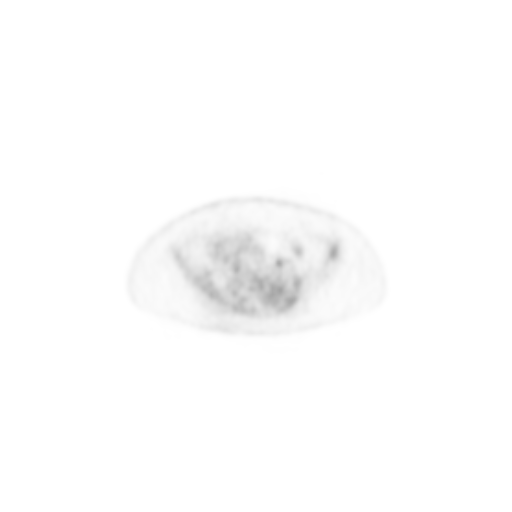
[im 249/249]
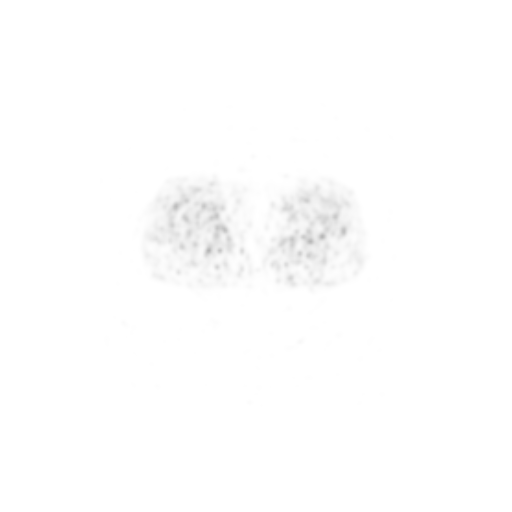

[Series 608: pet coronal · 1 of 96 slices shown]
[im 1/96]
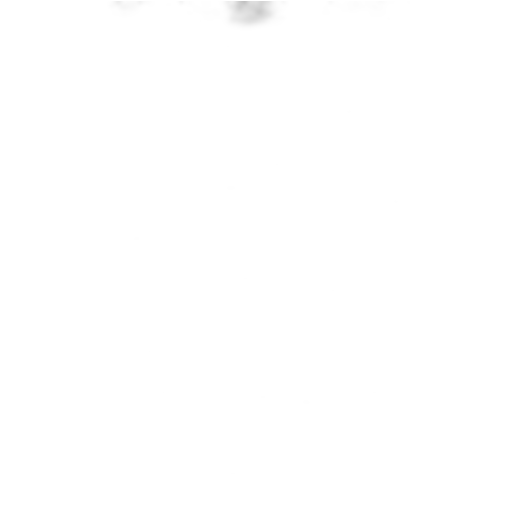

[Series 609: pet sagittal · 2 of 132 slices shown]
[im 1/132]
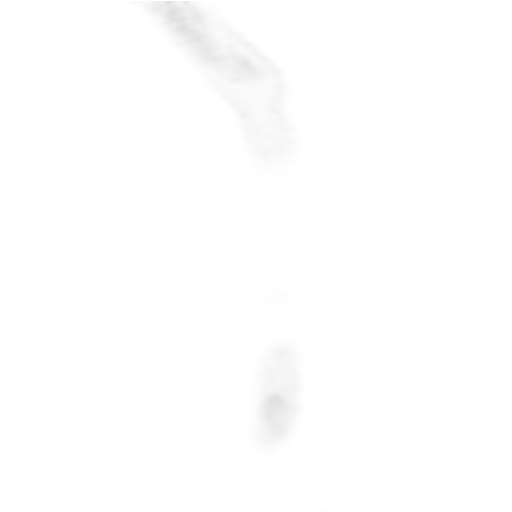
[im 132/132]
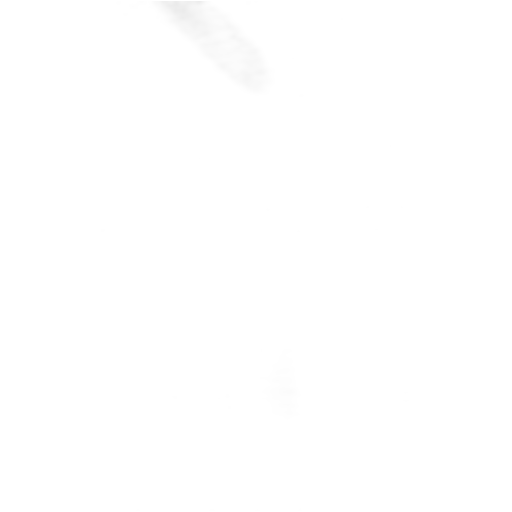

[24 of 25 positions shown; findings below may reference images not displayed]

FINDINGS: Mediastinal blood pool activity: SUV max

Liver activity: SUV max NA

NECK: No hypermetabolic lymph nodes in the neck.

Incidental CT findings: none

CHEST: Interval resolution of left supraclavicular hypermetabolic
node identified previously.

Similar marked interval response to therapy of the left subpectoral
and axillary lymphadenopathy. No residual hypermetabolic
lymphadenopathy in this region today. Index 7 mm short axis left
axillary node described on the previous study has no measurable
lymph node at this location today.

6 mm short axis right axillary node shows low level FDG uptake with
SUV max = 2.2. Low level activity is seen and additional small right
axillary nodes. Given the reported history of right upper extremity
COVID vaccination on [DATE], this probably reflects reactive
change given the generalized marked interval improvement in known
metastatic disease elsewhere.

Incidental CT findings: Right Port-A-Cath tip is positioned in the
right atrium near the tricuspid valve. Dependent atelectasis noted
in the lung bases.

ABDOMEN/PELVIS: No abnormal hypermetabolic activity within the
liver, pancreas, adrenal glands, or spleen. No hypermetabolic lymph
nodes in the abdomen or pelvis.

Incidental CT findings: none

SKELETON: Marked interval decrease in hypermetabolic skeletal
metastases. Interval vertebral augmentation at T6, L1, and L4. No
residual hypermetabolism at this level.

Index right sacral lesion with SUV max = 11.5 previously now shows
SUV max = 1.9. Evidence of sacroplasty associated.

Index left iliac lesion, involving the roof of the left acetabulum
has similarly improved. Previously, SUV max = 13.7, now SUV max =
2.0 (at blood pool level). Lesion is now sclerotic suggesting
interval healing.

The diffuse hypermetabolic spinal metastases seen previously are
similarly improved with only background marrow activity visible on
today's exam.

Orthopedic hardware now noted right femoral neck.

Incidental CT findings: none
IMPRESSION: 1. Marked interval response to therapy.
2. Hypermetabolic left supraclavicular, subpectoral, and axillary
lymphadenopathy has resolved with no residual hypermetabolism or
lymphadenopathy by CT size criteria.
3. Tiny lymph nodes in the right axilla show low level FDG uptake
(at or just above blood pool) likely secondary to recent COVID
vaccination in the right upper extremity. Close attention on
follow-up recommended.
4. Widespread hypermetabolic bony metastases show no residual
hypermetabolism on today's exam consistent with marked interval
response. Lytic bone lesion seen previously have become sclerotic in
the interval suggesting associated healing.
5. No new or residual findings to suggest hypermetabolic metastases
on today's study.

## 2019-12-16 MED ORDER — FLUDEOXYGLUCOSE F - 18 (FDG) INJECTION
6.7700 | Freq: Once | INTRAVENOUS | Status: AC | PRN
  Administered 2019-12-16: 6.77 via INTRAVENOUS

## 2019-12-19 ENCOUNTER — Encounter: Payer: Self-pay | Admitting: Pharmacy Technician

## 2019-12-19 ENCOUNTER — Telehealth: Payer: Self-pay | Admitting: Gastroenterology

## 2019-12-19 ENCOUNTER — Inpatient Hospital Stay: Payer: Self-pay | Attending: Oncology

## 2019-12-19 ENCOUNTER — Inpatient Hospital Stay: Payer: Self-pay

## 2019-12-19 ENCOUNTER — Other Ambulatory Visit: Payer: Self-pay

## 2019-12-19 ENCOUNTER — Ambulatory Visit (INDEPENDENT_AMBULATORY_CARE_PROVIDER_SITE_OTHER): Payer: Self-pay | Admitting: Gastroenterology

## 2019-12-19 ENCOUNTER — Encounter: Payer: Self-pay | Admitting: Gastroenterology

## 2019-12-19 ENCOUNTER — Inpatient Hospital Stay (HOSPITAL_BASED_OUTPATIENT_CLINIC_OR_DEPARTMENT_OTHER): Payer: Self-pay | Admitting: Oncology

## 2019-12-19 ENCOUNTER — Ambulatory Visit: Payer: Self-pay

## 2019-12-19 ENCOUNTER — Encounter: Payer: Self-pay | Admitting: Oncology

## 2019-12-19 VITALS — BP 110/73 | HR 75 | Temp 98.6°F | Resp 18 | Ht 60.0 in | Wt 127.1 lb

## 2019-12-19 VITALS — BP 99/67 | HR 81 | Temp 98.2°F | Wt 127.0 lb

## 2019-12-19 DIAGNOSIS — Z5111 Encounter for antineoplastic chemotherapy: Secondary | ICD-10-CM | POA: Insufficient documentation

## 2019-12-19 DIAGNOSIS — G893 Neoplasm related pain (acute) (chronic): Secondary | ICD-10-CM

## 2019-12-19 DIAGNOSIS — C50912 Malignant neoplasm of unspecified site of left female breast: Secondary | ICD-10-CM

## 2019-12-19 DIAGNOSIS — F418 Other specified anxiety disorders: Secondary | ICD-10-CM | POA: Insufficient documentation

## 2019-12-19 DIAGNOSIS — Z5112 Encounter for antineoplastic immunotherapy: Secondary | ICD-10-CM

## 2019-12-19 DIAGNOSIS — K5289 Other specified noninfective gastroenteritis and colitis: Secondary | ICD-10-CM

## 2019-12-19 DIAGNOSIS — G4700 Insomnia, unspecified: Secondary | ICD-10-CM | POA: Insufficient documentation

## 2019-12-19 DIAGNOSIS — Z23 Encounter for immunization: Secondary | ICD-10-CM | POA: Insufficient documentation

## 2019-12-19 DIAGNOSIS — R5381 Other malaise: Secondary | ICD-10-CM | POA: Insufficient documentation

## 2019-12-19 DIAGNOSIS — K625 Hemorrhage of anus and rectum: Secondary | ICD-10-CM

## 2019-12-19 DIAGNOSIS — Z7983 Long term (current) use of bisphosphonates: Secondary | ICD-10-CM | POA: Insufficient documentation

## 2019-12-19 DIAGNOSIS — Z95828 Presence of other vascular implants and grafts: Secondary | ICD-10-CM

## 2019-12-19 DIAGNOSIS — C50919 Malignant neoplasm of unspecified site of unspecified female breast: Secondary | ICD-10-CM

## 2019-12-19 DIAGNOSIS — Z515 Encounter for palliative care: Secondary | ICD-10-CM | POA: Insufficient documentation

## 2019-12-19 DIAGNOSIS — R5383 Other fatigue: Secondary | ICD-10-CM | POA: Insufficient documentation

## 2019-12-19 DIAGNOSIS — C7951 Secondary malignant neoplasm of bone: Secondary | ICD-10-CM | POA: Insufficient documentation

## 2019-12-19 DIAGNOSIS — Z171 Estrogen receptor negative status [ER-]: Secondary | ICD-10-CM | POA: Insufficient documentation

## 2019-12-19 DIAGNOSIS — M8440XA Pathological fracture, unspecified site, initial encounter for fracture: Secondary | ICD-10-CM

## 2019-12-19 DIAGNOSIS — Z79899 Other long term (current) drug therapy: Secondary | ICD-10-CM | POA: Insufficient documentation

## 2019-12-19 LAB — CBC WITH DIFFERENTIAL/PLATELET
Abs Immature Granulocytes: 0.05 10*3/uL (ref 0.00–0.07)
Basophils Absolute: 0 10*3/uL (ref 0.0–0.1)
Basophils Relative: 1 %
Eosinophils Absolute: 0.1 10*3/uL (ref 0.0–0.5)
Eosinophils Relative: 2 %
HCT: 32.4 % — ABNORMAL LOW (ref 36.0–46.0)
Hemoglobin: 11 g/dL — ABNORMAL LOW (ref 12.0–15.0)
Immature Granulocytes: 1 %
Lymphocytes Relative: 18 %
Lymphs Abs: 1 10*3/uL (ref 0.7–4.0)
MCH: 28.4 pg (ref 26.0–34.0)
MCHC: 34 g/dL (ref 30.0–36.0)
MCV: 83.7 fL (ref 80.0–100.0)
Monocytes Absolute: 0.3 10*3/uL (ref 0.1–1.0)
Monocytes Relative: 5 %
Neutro Abs: 4.1 10*3/uL (ref 1.7–7.7)
Neutrophils Relative %: 73 %
Platelets: 362 10*3/uL (ref 150–400)
RBC: 3.87 MIL/uL (ref 3.87–5.11)
RDW: 16.1 % — ABNORMAL HIGH (ref 11.5–15.5)
WBC: 5.5 10*3/uL (ref 4.0–10.5)
nRBC: 0 % (ref 0.0–0.2)

## 2019-12-19 LAB — COMPREHENSIVE METABOLIC PANEL
ALT: 33 U/L (ref 0–44)
AST: 28 U/L (ref 15–41)
Albumin: 3.6 g/dL (ref 3.5–5.0)
Alkaline Phosphatase: 67 U/L (ref 38–126)
Anion gap: 9 (ref 5–15)
BUN: 10 mg/dL (ref 6–20)
CO2: 23 mmol/L (ref 22–32)
Calcium: 8.5 mg/dL — ABNORMAL LOW (ref 8.9–10.3)
Chloride: 106 mmol/L (ref 98–111)
Creatinine, Ser: 0.58 mg/dL (ref 0.44–1.00)
GFR calc Af Amer: >60 mL/min (ref >60)
GFR calc non Af Amer: >60 mL/min (ref >60)
Glucose, Bld: 112 mg/dL — ABNORMAL HIGH (ref 70–99)
Potassium: 3.5 mmol/L (ref 3.5–5.1)
Sodium: 138 mmol/L (ref 135–145)
Total Bilirubin: 0.4 mg/dL (ref 0.3–1.2)
Total Protein: 6.6 g/dL (ref 6.5–8.1)

## 2019-12-19 MED ORDER — HEPARIN SOD (PORK) LOCK FLUSH 100 UNIT/ML IV SOLN
500.0000 [IU] | Freq: Once | INTRAVENOUS | Status: AC | PRN
  Administered 2019-12-19: 500 [IU]
  Filled 2019-12-19: qty 5

## 2019-12-19 MED ORDER — SODIUM CHLORIDE 0.9 % IV SOLN
Freq: Once | INTRAVENOUS | Status: AC
  Filled 2019-12-19: qty 250

## 2019-12-19 MED ORDER — FAMOTIDINE IN NACL 20-0.9 MG/50ML-% IV SOLN
20.0000 mg | Freq: Once | INTRAVENOUS | Status: AC
  Administered 2019-12-19: 20 mg via INTRAVENOUS
  Filled 2019-12-19: qty 50

## 2019-12-19 MED ORDER — SODIUM CHLORIDE 0.9 % IV SOLN
420.0000 mg | Freq: Once | INTRAVENOUS | Status: AC
  Administered 2019-12-19: 420 mg via INTRAVENOUS
  Filled 2019-12-19: qty 14

## 2019-12-19 MED ORDER — TRASTUZUMAB CHEMO 150 MG IV SOLR
6.0000 mg/kg | Freq: Once | INTRAVENOUS | Status: AC
  Administered 2019-12-19: 357 mg via INTRAVENOUS
  Filled 2019-12-19: qty 17

## 2019-12-19 MED ORDER — ACETAMINOPHEN 325 MG PO TABS
650.0000 mg | ORAL_TABLET | Freq: Once | ORAL | Status: AC
  Administered 2019-12-19: 650 mg via ORAL
  Filled 2019-12-19: qty 2

## 2019-12-19 MED ORDER — DIPHENHYDRAMINE HCL 50 MG/ML IJ SOLN
50.0000 mg | Freq: Once | INTRAMUSCULAR | Status: AC
  Administered 2019-12-19: 50 mg via INTRAVENOUS
  Filled 2019-12-19: qty 1

## 2019-12-19 MED ORDER — SODIUM CHLORIDE 0.9 % IV SOLN
65.0000 mg/m2 | Freq: Once | INTRAVENOUS | Status: AC
  Administered 2019-12-19: 108 mg via INTRAVENOUS
  Filled 2019-12-19: qty 18

## 2019-12-19 MED ORDER — SODIUM CHLORIDE 0.9% FLUSH
10.0000 mL | Freq: Once | INTRAVENOUS | Status: AC
  Administered 2019-12-19: 10 mL via INTRAVENOUS
  Filled 2019-12-19: qty 10

## 2019-12-19 MED ORDER — SODIUM CHLORIDE 0.9 % IV SOLN
20.0000 mg | Freq: Once | INTRAVENOUS | Status: AC
  Administered 2019-12-19: 20 mg via INTRAVENOUS
  Filled 2019-12-19: qty 20

## 2019-12-19 NOTE — Progress Notes (Signed)
Patient receiving help with her monthly expenses through the [employer].  PSN will assist with monthly gas vouchers and food cards to supplement what the [employer] is doing.

## 2019-12-19 NOTE — Progress Notes (Unsigned)
Patient has been approved for drug assistance by [employer] for [place]. The enrollment period is from 12/18/19-10/08/20 based on self pay. First DOS covered is TBA.

## 2019-12-19 NOTE — Progress Notes (Signed)
Cephas Darby, MD [address]  [place]  [place], [place] [address]  Main: [phone]  Fax: [phone]    Gastroenterology Consultation  Referring Provider:     [facility], [facility] Primary Care Physician:  [facility], [place], NP Primary Gastroenterologist:  Dr. Cephas Darby Reason for Consultation: Hospital follow-up, colitis        HPI:   Sophia [name] is a 39 y.o. female referred by Dr. Domingo Madeira, [facility], NP  for consultation & management of recent diagnosis of acute noninfectious colitis.  Patient has history of stage IV metastatic breast cancer ER/PR negative, HER-2/neu positive with bone mets, s/p chemo who was admitted to [facility] in July 2021 secondary to epigastric pain, poor p.o. intake, rectal bleeding, abdominal pain.  Cross-sectional imaging revealed right-sided colitis.  Stool studies were negative for infection.  She was empirically started on antibiotics and her symptoms responded.  Due to presence of rectal bleeding, patient underwent colonoscopy which revealed right-sided colitis and pathology confirmed mild active colitis with ulceration with evidence of chronicity.  Her colitis was thought to be chemo induced particularly Herceptin and Perjeta.  After discussing with Dr. Janese Banks, decision was made to treat symptomatically with antidiarrheal medication before initiation of Entyvio.  Patient has done well and her diarrhea has overall improved.  She does have mild episodes whenever she receives chemo and responds well to Imodium.  Patient also reports that her appetite has improved.  She is no longer experiencing epigastric pain.  Upper endoscopy was unremarkable including biopsies.  She does not have any GI concerns today  NSAIDs: None  Antiplts/Anticoagulants/Anti thrombotics: None  GI Procedures: EGD and colonoscopy 10/05/2019 - Normal duodenal bulb and second portion of the duodenum. - Erythematous mucosa in the gastric body.  Biopsied. - Normal incisura and antrum. Biopsied. - Medium-sized hiatal hernia. - Normal gastroesophageal junction and esophagus.  - The examined portion of the ileum was normal. Biopsied. - Diffuse severe inflammation was found in the ascending colon and in the cecum secondary to colitis. Biopsied. - Friability with no bleeding in the rectum, in the sigmoid colon, in the descending colon and in the transverse colon. Biopsied. - The distal rectum and anal verge are normal on retroflexion view.   Past Medical History:  Diagnosis Date  . Anxiety   . Breast cancer ([place])    with mets  . Cancer ([place])   . Colitis   . Vertigo     Past Surgical History:  Procedure Laterality Date  . BREAST BIOPSY Left 08/14/2019   [place] bx of mass, coil marker, path pending  . BREAST BIOPSY Left 08/14/2019   [place] bx of LN, hydromarker, path pending  . BREAST BIOPSY Left 08/14/2019   affirm bx of calcs, x marker, path pending  . ESOPHAGOGASTRODUODENOSCOPY (EGD) WITH PROPOFOL N/A 10/05/2019   Procedure: ESOPHAGOGASTRODUODENOSCOPY (EGD) WITH PROPOFOL;  Surgeon: Lin Landsman, MD;  Location: [facility];  Service: Gastroenterology;  Laterality: N/A;  . FLEXIBLE SIGMOIDOSCOPY N/A 10/05/2019   Procedure: FLEXIBLE SIGMOIDOSCOPY;  Surgeon: Lin Landsman, MD;  Location: [facility];  Service: Gastroenterology;  Laterality: N/A;  . INTRAMEDULLARY (IM) NAIL INTERTROCHANTERIC Right 09/01/2019   Procedure: INTRAMEDULLARY (IM) NAIL INTERTROCHANTRIC AND RADIOFREQUENCY ABLATION;  Surgeon: Hessie Knows, MD;  Location: [facility];  Service: Orthopedics;  Laterality: Right;  . KYPHOPLASTY N/A 08/29/2019   Procedure: KYPHOPLASTY T6, L1,L4 ,  RADIOFREQUENCY ABLATION;  Surgeon: Hessie Knows, MD;  Location: [facility];  Service: Orthopedics;  Laterality: N/A;  . KYPHOPLASTY Right 09/01/2019   Procedure: Right Sacral Radiofrequency Ablation and Cement Augmentation, Right sacrum and iliac crest;  Surgeon: Hessie Knows,  MD;  Location: [facility];  Service: Orthopedics;  Laterality: Right;  . PORTA CATH INSERTION N/A 08/28/2019   Procedure: PORTA CATH INSERTION;  Surgeon: Algernon Huxley, MD;  Location: [facility];  Service: Cardiovascular;  Laterality: N/A;    Current Outpatient Medications:  .  clindamycin (CLINDAGEL) 1 % gel, Apply topically 2 (two) times daily. X 7 days., Disp: 30 g, Rfl: 0 .  diphenoxylate-atropine (LOMOTIL) 2.5-0.025 MG tablet, Take 1 tablet by mouth 4 (four) times daily as needed for diarrhea or loose stools., Disp: 30 tablet, Rfl: 0 .  gabapentin (NEURONTIN) 300 MG capsule, Take 1 capsule (300 mg total) by mouth 2 (two) times daily., Disp: 60 capsule, Rfl: 1 .  lidocaine-prilocaine (EMLA) cream, Apply 1 application topically as needed. Apply small amount to port site at least 1 hour prior to it being accessed, cover with plastic wrap, Disp: 30 g, Rfl: 1 .  oxyCODONE (OXYCONTIN) 10 mg 12 hr tablet, Take 1 tablet (10 mg total) by mouth every 12 (twelve) hours., Disp: 60 tablet, Rfl: 0 .  traZODone (DESYREL) 100 MG tablet, Take 1 tablet (100 mg total) by mouth at bedtime., Disp: 30 tablet, Rfl: 2   Family History  Problem Relation Age of Onset  . Colon cancer Maternal Uncle      Social History   Tobacco Use  . Smoking status: Never Smoker  . Smokeless tobacco: Never Used  Vaping Use  . Vaping Use: Never used  Substance Use Topics  . Alcohol use: Not Currently  . Drug use: Not Currently    Allergies as of 12/19/2019  . (No Known Allergies)    Review of Systems:    All systems reviewed and negative except where noted in HPI.   Physical Exam:  BP 99/67 (BP Location: Left Arm, Patient Position: Sitting, Cuff Size: Normal)   Pulse 81   Temp 98.2 F (36.8 C) (Oral)   Wt 127 lb (57.6 kg)   BMI 24.80 kg/m  No LMP recorded. (Menstrual status: Chemotherapy).  General:   Alert,  Well-developed, well-nourished, pleasant and cooperative in NAD Head:  Normocephalic and  atraumatic. Eyes:  Sclera clear, no icterus.   Conjunctiva pink. Ears:  Normal auditory acuity. Nose:  No deformity, discharge, or lesions. Mouth:  No deformity or lesions,oropharynx pink & moist. Neck:  Supple; no masses or thyromegaly. Lungs:  Respirations even and unlabored.  Clear throughout to auscultation.   No wheezes, crackles, or rhonchi. No acute distress. Heart:  Regular rate and rhythm; no murmurs, clicks, rubs, or gallops. Abdomen:  Normal bowel sounds. Soft, non-tender and non-distended without masses, hepatosplenomegaly or hernias noted.  No guarding or rebound tenderness.   Rectal: Not performed Msk:  Symmetrical without gross deformities. Good, equal movement & strength bilaterally. Pulses:  Normal pulses noted. Extremities:  No clubbing or edema.  No cyanosis. Neurologic:  Alert and oriented x3;  grossly normal neurologically. Skin:  Intact without significant lesions or rashes. No jaundice. Lymph Nodes:  No significant cervical adenopathy. Psych:  Alert and cooperative. Normal mood and affect.  Imaging Studies: Reviewed  Assessment and Plan:   Sophia [name] is a 39 y.o. pleasant Hispanic female with stage IV metastatic breast cancer, currently on chemo is seen in consultation for acute noninfectious right-sided colitis which is secondary to chemotherapy.  Patient is currently  asymptomatic.  Continue conservative management at this time.  Deferred treatment with Biologics for chemo-induced colitis unless it deteriorates  Follow up as needed   Cephas Darby, MD

## 2019-12-19 NOTE — Progress Notes (Signed)
No new changes noted today 

## 2019-12-19 NOTE — Telephone Encounter (Signed)
LVM for patient to let her know of her 9am appt scheduled for today and to call our office if she has any questions or need to reschedule. A letter will be mailed out to patient.

## 2019-12-22 ENCOUNTER — Other Ambulatory Visit: Payer: Self-pay

## 2019-12-22 ENCOUNTER — Inpatient Hospital Stay: Payer: Self-pay

## 2019-12-22 NOTE — Progress Notes (Signed)
Nutrition Follow-up:  Patient with metastatic breast cancer.  Patient receiving taxol, perjeta and herceptin.    Met with patient in clinic with interpreter [name] present.  Patient 16 minutes late to appointment.  Patient reports that her taste is better and diarrhea is better.  Yesterday she ate oatmeal mixed with milk for breakfast.  Lunch was out to eat (chicken, boiled potatoes, corn, rice and beans).  Also had apple, water and crackers in the evening.  Drinks oral nutrition supplements at times but sometimes they upset stomach.    Medications: reviewed Labs: reviewed  Anthropometrics:   Weight 127 lb 1.6 oz on 10/1 increased from 125 lb 9 oz on 8/30   NUTRITION DIAGNOSIS: Inadequate oral intake improved   INTERVENTION:  Encouraged well balanced diet including good sources of protein.  Discussed ways to try supplements to decrease risk of stomach upset.   Contact information provided to patient   NEXT VISIT: no follow-up.  Patient will notify RD if changes in nutritional status occur.  Sophia [name] B. Zenia Resides, [place], [place] Registered Dietitian [phone] (mobile)

## 2019-12-24 NOTE — Progress Notes (Signed)
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 History obtained with the help of an interpreter.Patient self palpated left breast mass which was followed by a diagnostic bilateral mammogram. Mammogram showed 3.1 x 2.9 x 1.9 cm hypoechoic mass at the 1 o'clock position of the left breast. For abnormal cortically thickened left axillary lymph nodes measuring up to 5 mm. 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 Patient was also having ongoing back pain and was seen by [facility] orthopedics Dr. Doyle Askew who ordered MRI lumbar spine without contrast which showed possible pathologic fractures of L1 and L4 vertebral bodies with greater than 50% height loss at L1 and abnormal signal involving L2-L3 S1 as well as right iliac bone concerning for metastatic disease.Patient is a single mother of 3 adult children and is very anxious today.She reports significant back pain which radiates to her bilateral thighs. Denies any focal tingling numbness or weakness. Denies any bowel bladder incontinence. Pain has been uncontrolled despite taking Tylenol. No prior history of abnormal breast biopsies. No family history of breast cancer  PET and MRIshowed 3 areas of pathologic fracture of  her spine as well as widespread bony metastatic disease and concern for impending fracture of the right hip. Given her worsening pain she was asked to come to the ER. She has been evaluated by Dr. Rudene Christians from orthopedic surgery and underwent kyphoplasty at 3 different levels.T6 L1 and L4 along with radiofrequency ablation.She also underwent prophylactic fixation of the right hip and not affected the sacral region.   Patient received first dose of Herceptin and Perjeta on 09/04/2019. Baseline echocardiogram normal.she is currently getting taxol/ herceptin/perjeta  Patient admitted to hospital for acute abdominal pain with CT findings concerning for acute colitis. Colonoscopy showed diffuse severe inflammation with erythema friability and loss of vascularity and shallow ulcerations in the IC valve, ascending colon and cecum.   Interval history-patient has started taking Prozac for her depression which is helping her to some extent.  She is also taking trazodone for sleep.  She is anxious to hear the results of her PET scan  ECOG PS- 1 Pain scale- 0 Opioid associated constipation- no  Review of systems- Review of Systems  Constitutional: Positive for malaise/fatigue. Negative for chills, fever and weight loss.  HENT: Negative for congestion, ear discharge and nosebleeds.   Eyes: Negative for blurred vision.  Respiratory: Negative for cough, hemoptysis, sputum production, shortness of breath and wheezing.   Cardiovascular: Negative for chest pain, palpitations, orthopnea and claudication.  Gastrointestinal: Negative for abdominal pain, blood in stool, constipation, diarrhea, heartburn, melena, nausea and vomiting.  Genitourinary: Negative for dysuria, flank pain, frequency, hematuria and urgency.  Musculoskeletal: Positive for back pain. Negative for joint pain and myalgias.  Skin: Negative for rash.  Neurological: Negative for dizziness, tingling, focal weakness, seizures,  weakness and  headaches.  Endo/Heme/Allergies: Does not bruise/bleed easily.  Psychiatric/Behavioral: Negative for depression and suicidal ideas. The patient is nervous/anxious. The patient does not have insomnia.       No Known Allergies   Past Medical History:  Diagnosis Date  . Anxiety   . Breast cancer ([place])    with mets  . Cancer ([place])   . Colitis   . Vertigo      Past Surgical History:  Procedure Laterality Date  . BREAST BIOPSY Left 08/14/2019   [place] bx of mass, coil marker, path pending  . BREAST BIOPSY Left 08/14/2019   [place] bx of LN, hydromarker, path pending  . BREAST BIOPSY Left 08/14/2019   affirm bx of calcs, x marker, path pending  . ESOPHAGOGASTRODUODENOSCOPY (EGD) WITH PROPOFOL N/A 10/05/2019   Procedure: ESOPHAGOGASTRODUODENOSCOPY (EGD) WITH PROPOFOL;  Surgeon: Lin Landsman, MD;  Location: [place];  Service: Gastroenterology;  Laterality: N/A;  . FLEXIBLE SIGMOIDOSCOPY N/A 10/05/2019   Procedure: FLEXIBLE SIGMOIDOSCOPY;  Surgeon: Lin Landsman, MD;  Location: [facility] ENDOSCOPY;  Service: Gastroenterology;  Laterality: N/A;  . INTRAMEDULLARY (IM) NAIL INTERTROCHANTERIC Right 09/01/2019   Procedure: INTRAMEDULLARY (IM) NAIL INTERTROCHANTRIC AND RADIOFREQUENCY ABLATION;  Surgeon: Hessie Knows, MD;  Location: [facility] ORS;  Service: Orthopedics;  Laterality: Right;  . KYPHOPLASTY N/A 08/29/2019   Procedure: KYPHOPLASTY T6, L1,L4 ,  RADIOFREQUENCY ABLATION;  Surgeon: Hessie Knows, MD;  Location: [facility] ORS;  Service: Orthopedics;  Laterality: N/A;  . KYPHOPLASTY Right 09/01/2019   Procedure: Right Sacral Radiofrequency Ablation and Cement Augmentation, Right sacrum and iliac crest;  Surgeon: Hessie Knows, MD;  Location: [facility] ORS;  Service: Orthopedics;  Laterality: Right;  . PORTA CATH INSERTION N/A 08/28/2019   Procedure: PORTA CATH INSERTION;  Surgeon: Algernon Huxley, MD;  Location: [place] CV LAB;  Service: Cardiovascular;  Laterality: N/A;    Social History    Socioeconomic History  . Marital status: Single    Spouse name: Not on file  . Number of children: Not on file  . Years of education: Not on file  . Highest education level: Not on file  Occupational History  . Not on file  Tobacco Use  . Smoking status: Never Smoker  . Smokeless tobacco: Never Used  Vaping Use  . Vaping Use: Never used  Substance and Sexual Activity  . Alcohol use: Not Currently  . Drug use: Not Currently  . Sexual activity: Not Currently    Birth control/protection: None  Other Topics Concern  . Not on file  Social History Narrative   Lives at home with children   Social Determinants of Health   Financial Resource Strain:   . Difficulty of Paying Living Expenses: Not on file  Food Insecurity:   . Worried About Charity fundraiser in the Last Year: Not on file  . Ran Out of Food in the Last Year: Not on file  Transportation Needs:   . Lack of Transportation (Medical): Not on file  . Lack of Transportation (Non-Medical): Not on file  Physical Activity:   . Days of Exercise per Week: Not on file  . Minutes of Exercise per Session: Not on file  Stress:   . Feeling of Stress : Not on file  Social Connections:   . Frequency of Communication with Friends and Family: Not on file  . Frequency of Social Gatherings with Friends and Family: Not on file  . Attends Religious Services: Not on file  . Active Member of Clubs  or Organizations: Not on file  . Attends Archivist Meetings: Not on file  . Marital Status: Not on file  Intimate Partner Violence:   . Fear of Current or Ex-Partner: Not on file  . Emotionally Abused: Not on file  . Physically Abused: Not on file  . Sexually Abused: Not on file    Family History  Problem Relation Age of Onset  . Colon cancer Maternal Uncle      Current Outpatient Medications:  .  clindamycin (CLINDAGEL) 1 % gel, Apply topically 2 (two) times daily. X 7 days., Disp: 30 g, Rfl: 0 .   diphenoxylate-atropine (LOMOTIL) 2.5-0.025 MG tablet, Take 1 tablet by mouth 4 (four) times daily as needed for diarrhea or loose stools., Disp: 30 tablet, Rfl: 0 .  gabapentin (NEURONTIN) 300 MG capsule, Take 1 capsule (300 mg total) by mouth 2 (two) times daily., Disp: 60 capsule, Rfl: 1 .  lidocaine-prilocaine (EMLA) cream, Apply 1 application topically as needed. Apply small amount to port site at least 1 hour prior to it being accessed, cover with plastic wrap, Disp: 30 g, Rfl: 1 .  oxyCODONE (OXYCONTIN) 10 mg 12 hr tablet, Take 1 tablet (10 mg total) by mouth every 12 (twelve) hours., Disp: 60 tablet, Rfl: 0 .  traZODone (DESYREL) 100 MG tablet, Take 1 tablet (100 mg total) by mouth at bedtime., Disp: 30 tablet, Rfl: 2  Physical exam:  Vitals:   12/19/19 1110  BP: 110/73  Pulse: 75  Resp: 18  Temp: 98.6 F (37 C)  TempSrc: Tympanic  SpO2: 100%  Weight: 127 lb 1.6 oz (57.7 kg)  Height: 5' (1.524 m)   Physical Exam Cardiovascular:     Rate and Rhythm: Normal rate and regular rhythm.     Heart sounds: Normal heart sounds.  Pulmonary:     Effort: Pulmonary effort is normal.     Breath sounds: Normal breath sounds.  Abdominal:     General: Bowel sounds are normal.     Palpations: Abdomen is soft.  Skin:    General: Skin is warm and dry.  Neurological:     Mental Status: She is alert and oriented to person, place, and time.      CMP Latest Ref Rng & Units 12/19/2019  Glucose 70 - 99 mg/dL 112(H)  BUN 6 - 20 mg/dL 10  Creatinine 0.44 - 1.00 mg/dL 0.58  Sodium 135 - 145 mmol/L 138  Potassium 3.5 - 5.1 mmol/L 3.5  Chloride 98 - 111 mmol/L 106  CO2 22 - 32 mmol/L 23  Calcium 8.9 - 10.3 mg/dL 8.5(L)  Total Protein 6.5 - 8.1 g/dL 6.6  Total Bilirubin 0.3 - 1.2 mg/dL 0.4  Alkaline Phos 38 - 126 U/L 67  AST 15 - 41 U/L 28  ALT 0 - 44 U/L 33   CBC Latest Ref Rng & Units 12/19/2019  WBC 4.0 - 10.5 K/uL 5.5  Hemoglobin 12.0 - 15.0 g/dL 11.0(L)  Hematocrit 36 - 46 % 32.4(L)   Platelets 150 - 400 K/uL 362    No images are attached to the encounter.  MR Brain W Wo Contrast  Result Date: 12/11/2019 CLINICAL DATA:  Follow-up evaluation for metastatic breast cancer, surveillance. EXAM: MRI HEAD WITHOUT AND WITH CONTRAST TECHNIQUE: Multiplanar, multiecho pulse sequences of the brain and surrounding structures were obtained without and with intravenous contrast. CONTRAST:  89m GADAVIST GADOBUTROL 1 MMOL/ML IV SOLN COMPARISON:  Comparison made with prior brain MRI from 08/19/2019 as well as previous  cervical spine MRI from 08/25/2019. FINDINGS: Brain: Cerebral volume within normal limits for patient age. No focal parenchymal signal abnormality identified. No abnormal foci of restricted diffusion to suggest acute or subacute ischemia. Gray-white matter differentiation well maintained. No encephalomalacia to suggest chronic infarction. No foci of susceptibility artifact to suggest acute or chronic intracranial hemorrhage. No mass lesion, midline shift or mass effect. No hydrocephalus. Incidental note made of a 12 mm arachnoid cyst at the anterior left middle cranial fossa (series 10, image 10), of doubtful significance. No other extra-axial fluid collection. Major dural sinuses are grossly patent. Pituitary gland and suprasellar region are normal. Midline structures intact and normal. No abnormal enhancement within the brain itself. Vascular: Major intracranial vascular flow voids well maintained and normal in appearance. Skull and upper cervical spine: Craniocervical junction within normal limits. Known metastatic lesion involving the dens again seen, measuring 12 mm (series 9, image 12). Previously seen metastasis involving the left occipital condyle not well seen on today's exam, but grossly similar. There is a 1 cm enhancing lesion at the left frontal calvarium, most likely reflecting an osseous metastasis (series 18, image 131). No extra osseous extension. No other definite focal  area of placing lesions. Sinuses/Orbits: Globes and orbital soft tissues within normal limits. Mild scattered mucosal thickening noted within the ethmoidal air cells. Paranasal sinuses are otherwise clear. No mastoid effusion. Inner ear structures grossly normal. Other: None. IMPRESSION: 1. 1 cm enhancing lesion at the left frontal calvarium, consistent with an osseous metastasis. In retrospect, finding was likely present on prior MRI from 08/29/2019, although is slightly more prominent on today's study. 2. Additional osseous metastases involving the dens and left occipital condyle, similar to previous cervical spine MRI. 3. Otherwise normal brain MRI, with no other intracranial metastatic disease identified. Electronically Signed   By: Jeannine Boga M.D.   On: 12/11/2019 03:40   NM PET Image Restag (PS) Skull Base To Thigh  Result Date: 12/17/2019 CLINICAL DATA:  Subsequent treatment strategy for breast cancer. EXAM: NUCLEAR MEDICINE PET SKULL BASE TO THIGH TECHNIQUE: 6.8 mCi F-18 FDG was injected intravenously. Full-ring PET imaging was performed from the skull base to thigh after the radiotracer. CT data was obtained and used for attenuation correction and anatomic localization. Fasting blood glucose: 86 mg/dl COMPARISON:  Abdomen/pelvis CT 10/01/2019.  PET-CT 08/27/2019. FINDINGS: Mediastinal blood pool activity: SUV max 2.1 Liver activity: SUV max NA NECK: No hypermetabolic lymph nodes in the neck. Incidental CT findings: none CHEST: Interval resolution of left supraclavicular hypermetabolic node identified previously. Similar marked interval response to therapy of the left subpectoral and axillary lymphadenopathy. No residual hypermetabolic lymphadenopathy in this region today. Index 7 mm short axis left axillary node described on the previous study has no measurable lymph node at this location today. 6 mm short axis right axillary node shows low level FDG uptake with SUV max = 2.2. Low level  activity is seen and additional small right axillary nodes. Given the reported history of right upper extremity COVID vaccination on 11/24/2019, this probably reflects reactive change given the generalized marked interval improvement in known metastatic disease elsewhere. Incidental CT findings: Right Port-A-Cath tip is positioned in the right atrium near the tricuspid valve. Dependent atelectasis noted in the lung bases. ABDOMEN/PELVIS: No abnormal hypermetabolic activity within the liver, pancreas, adrenal glands, or spleen. No hypermetabolic lymph nodes in the abdomen or pelvis. Incidental CT findings: none SKELETON: Marked interval decrease in hypermetabolic skeletal metastases. Interval vertebral augmentation at T6, L1, and L4. No  residual hypermetabolism at this level. Index right sacral lesion with SUV max = 11.5 previously now shows SUV max = 1.9. Evidence of sacroplasty associated. Index left iliac lesion, involving the roof of the left acetabulum has similarly improved. Previously, SUV max = 13.7, now SUV max = 2.0 (at blood pool level). Lesion is now sclerotic suggesting interval healing. The diffuse hypermetabolic spinal metastases seen previously are similarly improved with only background marrow activity visible on today's exam. Orthopedic hardware now noted right femoral neck. Incidental CT findings: none IMPRESSION: 1. Marked interval response to therapy. 2. Hypermetabolic left supraclavicular, subpectoral, and axillary lymphadenopathy has resolved with no residual hypermetabolism or lymphadenopathy by CT size criteria. 3. Tiny lymph nodes in the right axilla show low level FDG uptake (at or just above blood pool) likely secondary to recent COVID vaccination in the right upper extremity. Close attention on follow-up recommended. 4. Widespread hypermetabolic bony metastases show no residual hypermetabolism on today's exam consistent with marked interval response. Lytic bone lesion seen previously have  become sclerotic in the interval suggesting associated healing. 5. No new or residual findings to suggest hypermetabolic metastases on today's study. Electronically Signed   By: Misty Stanley M.D.   On: 12/17/2019 09:24   ECHOCARDIOGRAM COMPLETE BUBBLE STUDY  Result Date: 12/02/2019    ECHOCARDIOGRAM REPORT   Patient Name:   PHUNG KOTAS [id number] Date of Exam: 12/02/2019 Medical Rec #:  [id number]             Height:       60.0 in Accession #:    [id number]            Weight:       126.5 lb Date of Birth:  1980-05-25             BSA:          1.536 m Patient Age:    28 years              BP:           121/76 mmHg Patient Gender: F                     HR:           96 bpm. Exam Location:  [facility] Procedure: 2D Echo, Cardiac Doppler, Color Doppler and Saline Contrast Bubble            Study Indications:     C 79.51 Bone metastases, Z51.11 Encounter for Antineoplastic                  chemotherapy  History:         Patient has prior history of Echocardiogram examinations, most                  recent 08/26/2019. Anxiety, breast cancer.  Sonographer:     Sherrie Sport RDCS (AE) Referring Phys:  [id number] Weston Anna Amit Meloy Diagnosing Phys: Harrell Gave End MD IMPRESSIONS  1. Left ventricular ejection fraction, by estimation, is 55 to 60%. The left ventricle has normal function. The left ventricle has no regional wall motion abnormalities. There is mild left ventricular hypertrophy of the septal and basal-septal segments.  Left ventricular diastolic parameters are consistent with Grade I diastolic dysfunction (impaired relaxation).  2. Right ventricular systolic function is normal. The right ventricular size is normal. Tricuspid regurgitation signal is inadequate for assessing PA pressure.  3. The mitral valve is normal in structure. Trivial mitral valve regurgitation. No  evidence of mitral stenosis.  4. The aortic valve is tricuspid. Aortic valve regurgitation is not visualized. No aortic stenosis is present.  5. Bubble study was  non-diagnostic. FINDINGS  Left Ventricle: Left ventricular ejection fraction, by estimation, is 55 to 60%. The left ventricle has normal function. The left ventricle has no regional wall motion abnormalities. The left ventricular internal cavity size was normal in size. There is  mild left ventricular hypertrophy of the septal and basal-septal segments. Left ventricular diastolic parameters are consistent with Grade I diastolic dysfunction (impaired relaxation). Right Ventricle: The right ventricular size is normal. No increase in right ventricular wall thickness. Right ventricular systolic function is normal. Tricuspid regurgitation signal is inadequate for assessing PA pressure. Left Atrium: Left atrial size was normal in size. Right Atrium: Right atrial size was normal in size. Pericardium: The pericardium was not well visualized. Mitral Valve: The mitral valve is normal in structure. Trivial mitral valve regurgitation. No evidence of mitral valve stenosis. Tricuspid Valve: The tricuspid valve is normal in structure. Tricuspid valve regurgitation is trivial. Aortic Valve: The aortic valve is tricuspid. Aortic valve regurgitation is not visualized. No aortic stenosis is present. Aortic valve mean gradient measures 3.0 mmHg. Aortic valve peak gradient measures 6.0 mmHg. Aortic valve area, by VTI measures 2.26 cm. Pulmonic Valve: The pulmonic valve was normal in structure. Pulmonic valve regurgitation is trivial. No evidence of pulmonic stenosis. Aorta: The aortic root is normal in size and structure. Pulmonary Artery: The pulmonary artery is of normal size. Venous: The inferior vena cava was not well visualized. IAS/Shunts: The interatrial septum was not well visualized. Agitated saline contrast was given intravenously to evaluate for intracardiac shunting. Bubble study was non-diagnostic.  LEFT VENTRICLE PLAX 2D LVIDd:         3.88 cm  Diastology LVIDs:         2.32 cm  LV e' medial:    5.87 cm/s LV PW:          0.83 cm  LV E/e' medial:  11.1 LV IVS:        1.11 cm  LV e' lateral:   4.24 cm/s LVOT diam:     2.00 cm  LV E/e' lateral: 15.3 LV SV:         52 LV SV Index:   34 LVOT Area:     3.14 cm  RIGHT VENTRICLE RV Basal diam:  3.05 cm RV S prime:     11.70 cm/s TAPSE (M-mode): 2.8 cm LEFT ATRIUM           Index       RIGHT ATRIUM           Index LA diam:      3.20 cm 2.08 cm/m  RA Area:     10.80 cm LA Vol (A4C): 34.6 ml 22.52 ml/m RA Volume:   21.10 ml  13.73 ml/m  AORTIC VALVE                   PULMONIC VALVE AV Area (Vmax):    2.02 cm    PV Vmax:        0.79 m/s AV Area (Vmean):   1.79 cm    PV Peak grad:   2.5 mmHg AV Area (VTI):     2.26 cm    RVOT Peak grad: 4 mmHg AV Vmax:           122.00 cm/s AV Vmean:  84.000 cm/s AV VTI:            0.229 m AV Peak Grad:      6.0 mmHg AV Mean Grad:      3.0 mmHg LVOT Vmax:         78.30 cm/s LVOT Vmean:        47.900 cm/s LVOT VTI:          0.165 m LVOT/AV VTI ratio: 0.72  AORTA Ao Root diam: 2.80 cm MITRAL VALVE MV Area (PHT): 2.87 cm    SHUNTS MV Decel Time: 264 msec    Systemic VTI:  0.16 m MV E velocity: 65.00 cm/s  Systemic Diam: 2.00 cm MV A velocity: 84.90 cm/s MV E/A ratio:  0.77 Harrell Gave End MD Electronically signed by Nelva Bush MD Signature Date/Time: 12/02/2019/4:16:39 PM    Final      Assessment and plan- Patient is a 39 y.o. female with metastatic ER negative her 2 positive breast cancer on taxol/herceptin and perjeta.    She is here for on treatment assessment prior to cycle 6-day 1 of Taxol Herceptin and Perjeta  I have reviewed PET/CT scan images independently and discussed findings with the patient.  Overall she has had an excellent response to treatment. Clinically I do not palpate her left breast mass anymore.  There has been a marked interval decrease in the hypermetabolic skeletal metastases left supraclavicular subpectoral and axillary lymphadenopathy has resolved.  MRI brain was negative for intracranial metastatic disease  as well  I plan to continue Taxol Herceptin and Perjeta until progression or toxicity.  She will proceed with Taxol Herceptin Perjeta cycle6-day 1 today.  She will receive Taxol next week and I will see her back in 2 weeks when she gets Xgeva as well.   Neoplasm related pain: Continue as needed oxycodone   Anxiety and depression: Continue Prozac   Visit Diagnosis 1. Encounter for antineoplastic chemotherapy   2. Encounter for monoclonal antibody treatment for malignancy   3. Primary cancer of left breast with metastasis to other site [facility])      Dr. Randa Evens, MD, MPH [facility] [phone] 12/24/2019 9:06 PM

## 2019-12-26 ENCOUNTER — Inpatient Hospital Stay: Payer: Self-pay

## 2019-12-26 ENCOUNTER — Other Ambulatory Visit: Payer: Self-pay

## 2019-12-26 ENCOUNTER — Inpatient Hospital Stay (HOSPITAL_BASED_OUTPATIENT_CLINIC_OR_DEPARTMENT_OTHER): Payer: Self-pay | Admitting: Hospice and Palliative Medicine

## 2019-12-26 VITALS — BP 111/74 | HR 71 | Temp 99.4°F | Resp 16 | Wt 129.0 lb

## 2019-12-26 DIAGNOSIS — C50919 Malignant neoplasm of unspecified site of unspecified female breast: Secondary | ICD-10-CM

## 2019-12-26 DIAGNOSIS — F32A Depression, unspecified: Secondary | ICD-10-CM

## 2019-12-26 DIAGNOSIS — M8440XA Pathological fracture, unspecified site, initial encounter for fracture: Secondary | ICD-10-CM

## 2019-12-26 DIAGNOSIS — Z515 Encounter for palliative care: Secondary | ICD-10-CM

## 2019-12-26 DIAGNOSIS — F419 Anxiety disorder, unspecified: Secondary | ICD-10-CM

## 2019-12-26 LAB — COMPREHENSIVE METABOLIC PANEL
ALT: 21 U/L (ref 0–44)
AST: 27 U/L (ref 15–41)
Albumin: 3.5 g/dL (ref 3.5–5.0)
Alkaline Phosphatase: 60 U/L (ref 38–126)
Anion gap: 7 (ref 5–15)
BUN: 8 mg/dL (ref 6–20)
CO2: 24 mmol/L (ref 22–32)
Calcium: 8.3 mg/dL — ABNORMAL LOW (ref 8.9–10.3)
Chloride: 106 mmol/L (ref 98–111)
Creatinine, Ser: 0.4 mg/dL — ABNORMAL LOW (ref 0.44–1.00)
GFR calc non Af Amer: >60 mL/min (ref >60)
Glucose, Bld: 103 mg/dL — ABNORMAL HIGH (ref 70–99)
Potassium: 3.7 mmol/L (ref 3.5–5.1)
Sodium: 137 mmol/L (ref 135–145)
Total Bilirubin: 0.5 mg/dL (ref 0.3–1.2)
Total Protein: 6.4 g/dL — ABNORMAL LOW (ref 6.5–8.1)

## 2019-12-26 LAB — CBC WITH DIFFERENTIAL/PLATELET
Abs Immature Granulocytes: 0.04 10*3/uL (ref 0.00–0.07)
Basophils Absolute: 0 10*3/uL (ref 0.0–0.1)
Basophils Relative: 0 %
Eosinophils Absolute: 0.1 10*3/uL (ref 0.0–0.5)
Eosinophils Relative: 2 %
HCT: 35.1 % — ABNORMAL LOW (ref 36.0–46.0)
Hemoglobin: 11.5 g/dL — ABNORMAL LOW (ref 12.0–15.0)
Immature Granulocytes: 1 %
Lymphocytes Relative: 16 %
Lymphs Abs: 0.8 10*3/uL (ref 0.7–4.0)
MCH: 28.1 pg (ref 26.0–34.0)
MCHC: 32.8 g/dL (ref 30.0–36.0)
MCV: 85.8 fL (ref 80.0–100.0)
Monocytes Absolute: 0.2 10*3/uL (ref 0.1–1.0)
Monocytes Relative: 5 %
Neutro Abs: 3.5 10*3/uL (ref 1.7–7.7)
Neutrophils Relative %: 76 %
Platelets: 374 10*3/uL (ref 150–400)
RBC: 4.09 MIL/uL (ref 3.87–5.11)
RDW: 16.5 % — ABNORMAL HIGH (ref 11.5–15.5)
WBC: 4.6 10*3/uL (ref 4.0–10.5)
nRBC: 0 % (ref 0.0–0.2)

## 2019-12-26 MED ORDER — SODIUM CHLORIDE 0.9 % IV SOLN
65.0000 mg/m2 | Freq: Once | INTRAVENOUS | Status: AC
  Administered 2019-12-26: 108 mg via INTRAVENOUS
  Filled 2019-12-26: qty 18

## 2019-12-26 MED ORDER — HEPARIN SOD (PORK) LOCK FLUSH 100 UNIT/ML IV SOLN
INTRAVENOUS | Status: AC
  Filled 2019-12-26: qty 5

## 2019-12-26 MED ORDER — SODIUM CHLORIDE 0.9 % IV SOLN
20.0000 mg | Freq: Once | INTRAVENOUS | Status: AC
  Administered 2019-12-26: 20 mg via INTRAVENOUS
  Filled 2019-12-26: qty 20

## 2019-12-26 MED ORDER — SODIUM CHLORIDE 0.9 % IV SOLN
Freq: Once | INTRAVENOUS | Status: AC
  Filled 2019-12-26: qty 250

## 2019-12-26 MED ORDER — SODIUM CHLORIDE 0.9% FLUSH
10.0000 mL | Freq: Once | INTRAVENOUS | Status: DC
  Filled 2019-12-26: qty 10

## 2019-12-26 MED ORDER — DIPHENHYDRAMINE HCL 50 MG/ML IJ SOLN
50.0000 mg | Freq: Once | INTRAMUSCULAR | Status: AC
  Administered 2019-12-26: 50 mg via INTRAVENOUS
  Filled 2019-12-26: qty 1

## 2019-12-26 MED ORDER — LORAZEPAM 0.5 MG PO TABS: 0.5000 mg | ORAL_TABLET | Freq: Three times a day (TID) | ORAL | 0 refills | Status: DC

## 2019-12-26 MED ORDER — HEPARIN SOD (PORK) LOCK FLUSH 100 UNIT/ML IV SOLN
500.0000 [IU] | Freq: Once | INTRAVENOUS | Status: AC
  Administered 2019-12-26: 500 [IU] via INTRAVENOUS
  Filled 2019-12-26: qty 5

## 2019-12-26 MED ORDER — FAMOTIDINE IN NACL 20-0.9 MG/50ML-% IV SOLN
20.0000 mg | Freq: Once | INTRAVENOUS | Status: AC
  Administered 2019-12-26: 20 mg via INTRAVENOUS
  Filled 2019-12-26: qty 50

## 2019-12-26 MED ORDER — TRAZODONE HCL 100 MG PO TABS
100.0000 mg | ORAL_TABLET | Freq: Every day | ORAL | 2 refills | Status: DC
Start: 2019-12-26 — End: 2020-04-09

## 2019-12-26 NOTE — Progress Notes (Signed)
[place]  Telephone:([phone] [phone]   Name: Sophia [name] Date: 12/26/2019 MRN: [id number]  DOB: [birth year]  Patient Care Team: [facility], [place], NP as PCP - General Rico Junker, RN as Registered Nurse Theodore Demark, RN as Registered Nurse    REASON FOR CONSULTATION: Sophia [name] is a 39 y.o. 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  Patient has history of pathologic fracture to spine status post kyphoplasty.  She also underwent prophylactic fixation of the right hip due to impending fracture.  She has had significant depression and anxiety.  She was referred to palliative care to help address goals and manage ongoing symptoms.  SOCIAL HISTORY:     reports that she has never smoked. She has never used smokeless tobacco. She reports previous alcohol use. She reports previous drug use.  Patient is from [place].  She speaks [place].  She lives here with her 3 children ages 27-15.  She has no family or friend/social support locally.  She is unable to work.  ADVANCE DIRECTIVES:  Does not have  CODE STATUS:   PAST MEDICAL HISTORY: Past Medical History:  Diagnosis Date   Anxiety    Breast cancer ([place])    with mets   Cancer ([place])    Colitis    Vertigo     PAST SURGICAL HISTORY:  Past Surgical History:  Procedure Laterality Date   BREAST BIOPSY Left 08/14/2019   [place] bx of mass, coil marker, path pending   BREAST BIOPSY Left 08/14/2019   [place] bx of LN, hydromarker, path pending   BREAST BIOPSY Left 08/14/2019   affirm bx of calcs, x marker, path pending   ESOPHAGOGASTRODUODENOSCOPY (EGD) WITH PROPOFOL N/A 10/05/2019   Procedure: ESOPHAGOGASTRODUODENOSCOPY (EGD) WITH PROPOFOL;  Surgeon: Lin Landsman, MD;  Location: [facility];  Service: Gastroenterology;  Laterality: N/A;    FLEXIBLE SIGMOIDOSCOPY N/A 10/05/2019   Procedure: FLEXIBLE SIGMOIDOSCOPY;  Surgeon: Lin Landsman, MD;  Location: [facility];  Service: Gastroenterology;  Laterality: N/A;   INTRAMEDULLARY (IM) NAIL INTERTROCHANTERIC Right 09/01/2019   Procedure: INTRAMEDULLARY (IM) NAIL INTERTROCHANTRIC AND RADIOFREQUENCY ABLATION;  Surgeon: Hessie Knows, MD;  Location: [facility] OR[facility];  Service: Orthopedics;  Laterality: Right;   KYPHOPLASTY N/A 08/29/2019   Procedure: KYPHOPLASTY T6, L1,L4 ,  RADIOFREQUENCY ABLATION;  Surgeon: Hessie Knows, MD;  Location: [facility] OR[facility];  Service: Orthopedics;  Laterality: N/A;   KYPHOPLASTY Right 09/01/2019   Procedure: Right Sacral Radiofrequency Ablation and Cement Augmentation, Right sacrum and iliac crest;  Surgeon: Hessie Knows, MD;  Location: [facility];  Service: Orthopedics;  Laterality: Right;   PORTA CATH INSERTION N/A 08/28/2019   Procedure: PORTA CATH INSERTION;  Surgeon: Algernon Huxley, MD;  Location: [facility];  Service: Cardiovascular;  Laterality: N/A;    HEMATOLOGY/ONCOLOGY HISTORY:  Oncology History  Metastatic breast cancer ([place])  08/29/2019 Initial Diagnosis   Metastatic breast cancer ([place])   09/04/2019 -  Chemotherapy   The patient had trastuzumab (HERCEPTIN) 450 mg in sodium chloride 0.9 % 250 mL chemo infusion, 483 mg (100 % of original dose 8 mg/kg), Intravenous,  Once, 6 of 6 cycles Dose modification: 8 mg/kg (original dose 8 mg/kg, Cycle 1, Reason: Other (see comments), Comment: loading dose), 6 mg/kg (original dose 6 mg/kg, Cycle 2, Reason: Other (see comments), Comment: per md) Administration: 450 mg (09/04/2019), 357 mg (09/26/2019), 357 mg (10/17/2019), 357 mg (  11/28/2019), 357 mg (12/19/2019), 357 mg (11/07/2019) PACLitaxel (TAXOL) 132 mg in sodium chloride 0.9 % 250 mL chemo infusion (</= $RemoveBefor'80mg'SvoJfYsIsXXN$ /m2), 80 mg/m2 = 132 mg, Intravenous,  Once, 6 of 6 cycles Dose modification: 65 mg/m2 (original dose 80 mg/m2, Cycle 3, Reason: Dose not  tolerated) Administration: 132 mg (09/11/2019), 132 mg (09/26/2019), 108 mg (10/17/2019), 108 mg (10/24/2019), 108 mg (11/28/2019), 108 mg (12/05/2019), 108 mg (12/19/2019), 132 mg (09/19/2019), 108 mg (11/07/2019), 108 mg (10/31/2019), 108 mg (11/14/2019), 108 mg (11/21/2019), 108 mg (12/12/2019) pertuzumab (PERJETA) 840 mg in sodium chloride 0.9 % 250 mL chemo infusion, 840 mg, Intravenous, Once, 6 of 6 cycles Administration: 840 mg (09/04/2019), 420 mg (09/26/2019), 420 mg (10/17/2019), 420 mg (11/28/2019), 420 mg (12/19/2019), 420 mg (11/07/2019) trastuzumab-dkst (OGIVRI) 483 mg in sodium chloride 0.9 % 250 mL chemo infusion, 8 mg/kg = 483 mg, Intravenous,  Once, 1 of 1 cycle  for chemotherapy treatment.      ALLERGIES:  has No Known Allergies.  MEDICATIONS:  Current Outpatient Medications  Medication Sig Dispense Refill   clindamycin (CLINDAGEL) 1 % gel Apply topically 2 (two) times daily. X 7 days. 30 g 0   diphenoxylate-atropine (LOMOTIL) 2.5-0.025 MG tablet Take 1 tablet by mouth 4 (four) times daily as needed for diarrhea or loose stools. 30 tablet 0   gabapentin (NEURONTIN) 300 MG capsule Take 1 capsule (300 mg total) by mouth 2 (two) times daily. 60 capsule 1   lidocaine-prilocaine (EMLA) cream Apply 1 application topically as needed. Apply small amount to port site at least 1 hour prior to it being accessed, cover with plastic wrap 30 g 1   oxyCODONE (OXYCONTIN) 10 mg 12 hr tablet Take 1 tablet (10 mg total) by mouth every 12 (twelve) hours. 60 tablet 0   traZODone (DESYREL) 100 MG tablet Take 1 tablet (100 mg total) by mouth at bedtime. 30 tablet 2   No current facility-administered medications for this visit.   Facility-Administered Medications Ordered in Other Visits  Medication Dose Route Frequency Provider Last Rate Last Admin   heparin lock flush 100 unit/mL  500 Units Intravenous Once Sindy Guadeloupe, MD       PACLitaxel (TAXOL) 108 mg in sodium chloride 0.9 % 250 mL chemo infusion (</=  $Rem'80mg'hQAO$ /m2)  65 mg/m2 (Treatment Plan Recorded) Intravenous Once Sindy Guadeloupe, MD 268 mL/hr at 12/26/19 1137 108 mg at 12/26/19 1137   sodium chloride flush (NS) 0.9 % injection 10 mL  10 mL Intravenous Once Sindy Guadeloupe, MD        VITAL SIGNS: There were no vitals taken for this visit. There were no vitals filed for this visit.  Estimated body mass index is 25.19 kg/m as calculated from the following:   Height as of 12/19/19: 5' (1.524 m).   Weight as of an earlier encounter on 12/26/19: 129 lb (58.5 kg).  LABS: CBC:    Component Value Date/Time   WBC 4.6 12/26/2019 1002   HGB 11.5 (L) 12/26/2019 1002   HCT 35.1 (L) 12/26/2019 1002   PLT 374 12/26/2019 1002   MCV 85.8 12/26/2019 1002   NEUTROABS 3.5 12/26/2019 1002   LYMPHSABS 0.8 12/26/2019 1002   MONOABS 0.2 12/26/2019 1002   EOSABS 0.1 12/26/2019 1002   BASOSABS 0.0 12/26/2019 1002   Comprehensive Metabolic Panel:    Component Value Date/Time   NA 137 12/26/2019 1002   K 3.7 12/26/2019 1002   CL 106 12/26/2019 1002   CO2 24 12/26/2019 1002  BUN 8 12/26/2019 1002   CREATININE 0.40 (L) 12/26/2019 1002   GLUCOSE 103 (H) 12/26/2019 1002   CALCIUM 8.3 (L) 12/26/2019 1002   AST 27 12/26/2019 1002   ALT 21 12/26/2019 1002   ALKPHOS 60 12/26/2019 1002   BILITOT 0.5 12/26/2019 1002   PROT 6.4 (L) 12/26/2019 1002   ALBUMIN 3.5 12/26/2019 1002    RADIOGRAPHIC STUDIES: MR Brain W Wo Contrast  Result Date: 12/11/2019 CLINICAL DATA:  Follow-up evaluation for metastatic breast cancer, surveillance. EXAM: MRI HEAD WITHOUT AND WITH CONTRAST TECHNIQUE: Multiplanar, multiecho pulse sequences of the brain and surrounding structures were obtained without and with intravenous contrast. CONTRAST:  39mL GADAVIST GADOBUTROL 1 MMOL/ML IV SOLN COMPARISON:  Comparison made with prior brain MRI from 08/19/2019 as well as previous cervical spine MRI from 08/25/2019. FINDINGS: Brain: Cerebral volume within normal limits for patient age. No  focal parenchymal signal abnormality identified. No abnormal foci of restricted diffusion to suggest acute or subacute ischemia. Gray-white matter differentiation well maintained. No encephalomalacia to suggest chronic infarction. No foci of susceptibility artifact to suggest acute or chronic intracranial hemorrhage. No mass lesion, midline shift or mass effect. No hydrocephalus. Incidental note made of a 12 mm arachnoid cyst at the anterior left middle cranial fossa (series 10, image 10), of doubtful significance. No other extra-axial fluid collection. Major dural sinuses are grossly patent. Pituitary gland and suprasellar region are normal. Midline structures intact and normal. No abnormal enhancement within the brain itself. Vascular: Major intracranial vascular flow voids well maintained and normal in appearance. Skull and upper cervical spine: Craniocervical junction within normal limits. Known metastatic lesion involving the dens again seen, measuring 12 mm (series 9, image 12). Previously seen metastasis involving the left occipital condyle not well seen on today's exam, but grossly similar. There is a 1 cm enhancing lesion at the left frontal calvarium, most likely reflecting an osseous metastasis (series 18, image 131). No extra osseous extension. No other definite focal area of placing lesions. Sinuses/Orbits: Globes and orbital soft tissues within normal limits. Mild scattered mucosal thickening noted within the ethmoidal air cells. Paranasal sinuses are otherwise clear. No mastoid effusion. Inner ear structures grossly normal. Other: None. IMPRESSION: 1. 1 cm enhancing lesion at the left frontal calvarium, consistent with an osseous metastasis. In retrospect, finding was likely present on prior MRI from 08/29/2019, although is slightly more prominent on today's study. 2. Additional osseous metastases involving the dens and left occipital condyle, similar to previous cervical spine MRI. 3. Otherwise  normal brain MRI, with no other intracranial metastatic disease identified. Electronically Signed   By: Rise Mu M.D.   On: 12/11/2019 03:40   NM PET Image Restag (PS) Skull Base To Thigh  Result Date: 12/17/2019 CLINICAL DATA:  Subsequent treatment strategy for breast cancer. EXAM: NUCLEAR MEDICINE PET SKULL BASE TO THIGH TECHNIQUE: 6.8 mCi F-18 FDG was injected intravenously. Full-ring PET imaging was performed from the skull base to thigh after the radiotracer. CT data was obtained and used for attenuation correction and anatomic localization. Fasting blood glucose: 86 mg/dl COMPARISON:  Abdomen/pelvis CT 10/01/2019.  PET-CT 08/27/2019. FINDINGS: Mediastinal blood pool activity: SUV max 2.1 Liver activity: SUV max NA NECK: No hypermetabolic lymph nodes in the neck. Incidental CT findings: none CHEST: Interval resolution of left supraclavicular hypermetabolic node identified previously. Similar marked interval response to therapy of the left subpectoral and axillary lymphadenopathy. No residual hypermetabolic lymphadenopathy in this region today. Index 7 mm short axis left axillary node described on  the previous study has no measurable lymph node at this location today. 6 mm short axis right axillary node shows low level FDG uptake with SUV max = 2.2. Low level activity is seen and additional small right axillary nodes. Given the reported history of right upper extremity COVID vaccination on 11/24/2019, this probably reflects reactive change given the generalized marked interval improvement in known metastatic disease elsewhere. Incidental CT findings: Right Port-A-Cath tip is positioned in the right atrium near the tricuspid valve. Dependent atelectasis noted in the lung bases. ABDOMEN/PELVIS: No abnormal hypermetabolic activity within the liver, pancreas, adrenal glands, or spleen. No hypermetabolic lymph nodes in the abdomen or pelvis. Incidental CT findings: none SKELETON: Marked interval  decrease in hypermetabolic skeletal metastases. Interval vertebral augmentation at T6, L1, and L4. No residual hypermetabolism at this level. Index right sacral lesion with SUV max = 11.5 previously now shows SUV max = 1.9. Evidence of sacroplasty associated. Index left iliac lesion, involving the roof of the left acetabulum has similarly improved. Previously, SUV max = 13.7, now SUV max = 2.0 (at blood pool level). Lesion is now sclerotic suggesting interval healing. The diffuse hypermetabolic spinal metastases seen previously are similarly improved with only background marrow activity visible on today's exam. Orthopedic hardware now noted right femoral neck. Incidental CT findings: none IMPRESSION: 1. Marked interval response to therapy. 2. Hypermetabolic left supraclavicular, subpectoral, and axillary lymphadenopathy has resolved with no residual hypermetabolism or lymphadenopathy by CT size criteria. 3. Tiny lymph nodes in the right axilla show low level FDG uptake (at or just above blood pool) likely secondary to recent COVID vaccination in the right upper extremity. Close attention on follow-up recommended. 4. Widespread hypermetabolic bony metastases show no residual hypermetabolism on today's exam consistent with marked interval response. Lytic bone lesion seen previously have become sclerotic in the interval suggesting associated healing. 5. No new or residual findings to suggest hypermetabolic metastases on today's study. Electronically Signed   By: Misty Stanley M.D.   On: 12/17/2019 09:24   ECHOCARDIOGRAM COMPLETE BUBBLE STUDY  Result Date: 12/02/2019    ECHOCARDIOGRAM REPORT   Patient Name:   SHIRLY BARTOSIEWICZ JIRCVEL Date of Exam: 12/02/2019 Medical Rec #:  [id number]             Height:       60.0 in Accession #:    [id number]            Weight:       126.5 lb Date of Birth:  [birth year]             BSA:          1.536 m Patient Age:    21 years              BP:           121/76 mmHg Patient Gender: F                      HR:           96 bpm. Exam Location:  [facility] Procedure: 2D Echo, Cardiac Doppler, Color Doppler and Saline Contrast Bubble            Study Indications:     C 79.51 Bone metastases, Z51.11 Encounter for Antineoplastic                  chemotherapy  History:         Patient has prior history of Echocardiogram examinations, most  recent 08/26/2019. Anxiety, breast cancer.  Sonographer:     Sherrie Sport RDCS (AE) Referring Phys:  [id number] Weston Anna RAO Diagnosing Phys: Harrell Gave End MD IMPRESSIONS  1. Left ventricular ejection fraction, by estimation, is 55 to 60%. The left ventricle has normal function. The left ventricle has no regional wall motion abnormalities. There is mild left ventricular hypertrophy of the septal and basal-septal segments.  Left ventricular diastolic parameters are consistent with Grade I diastolic dysfunction (impaired relaxation).  2. Right ventricular systolic function is normal. The right ventricular size is normal. Tricuspid regurgitation signal is inadequate for assessing PA pressure.  3. The mitral valve is normal in structure. Trivial mitral valve regurgitation. No evidence of mitral stenosis.  4. The aortic valve is tricuspid. Aortic valve regurgitation is not visualized. No aortic stenosis is present.  5. Bubble study was non-diagnostic. FINDINGS  Left Ventricle: Left ventricular ejection fraction, by estimation, is 55 to 60%. The left ventricle has normal function. The left ventricle has no regional wall motion abnormalities. The left ventricular internal cavity size was normal in size. There is  mild left ventricular hypertrophy of the septal and basal-septal segments. Left ventricular diastolic parameters are consistent with Grade I diastolic dysfunction (impaired relaxation). Right Ventricle: The right ventricular size is normal. No increase in right ventricular wall thickness. Right ventricular systolic function is normal. Tricuspid regurgitation  signal is inadequate for assessing PA pressure. Left Atrium: Left atrial size was normal in size. Right Atrium: Right atrial size was normal in size. Pericardium: The pericardium was not well visualized. Mitral Valve: The mitral valve is normal in structure. Trivial mitral valve regurgitation. No evidence of mitral valve stenosis. Tricuspid Valve: The tricuspid valve is normal in structure. Tricuspid valve regurgitation is trivial. Aortic Valve: The aortic valve is tricuspid. Aortic valve regurgitation is not visualized. No aortic stenosis is present. Aortic valve mean gradient measures 3.0 mmHg. Aortic valve peak gradient measures 6.0 mmHg. Aortic valve area, by VTI measures 2.26 cm. Pulmonic Valve: The pulmonic valve was normal in structure. Pulmonic valve regurgitation is trivial. No evidence of pulmonic stenosis. Aorta: The aortic root is normal in size and structure. Pulmonary Artery: The pulmonary artery is of normal size. Venous: The inferior vena cava was not well visualized. IAS/Shunts: The interatrial septum was not well visualized. Agitated saline contrast was given intravenously to evaluate for intracardiac shunting. Bubble study was non-diagnostic.  LEFT VENTRICLE PLAX 2D LVIDd:         3.88 cm  Diastology LVIDs:         2.32 cm  LV e' medial:    5.87 cm/s LV PW:         0.83 cm  LV E/e' medial:  11.1 LV IVS:        1.11 cm  LV e' lateral:   4.24 cm/s LVOT diam:     2.00 cm  LV E/e' lateral: 15.3 LV SV:         52 LV SV Index:   34 LVOT Area:     3.14 cm  RIGHT VENTRICLE RV Basal diam:  3.05 cm RV S prime:     11.70 cm/s TAPSE (M-mode): 2.8 cm LEFT ATRIUM           Index       RIGHT ATRIUM           Index LA diam:      3.20 cm 2.08 cm/m  RA Area:     10.80 cm LA Vol (A4C):  34.6 ml 22.52 ml/m RA Volume:   21.10 ml  13.73 ml/m  AORTIC VALVE                   PULMONIC VALVE AV Area (Vmax):    2.02 cm    PV Vmax:        0.79 m/s AV Area (Vmean):   1.79 cm    PV Peak grad:   2.5 mmHg AV Area (VTI):      2.26 cm    RVOT Peak grad: 4 mmHg AV Vmax:           122.00 cm/s AV Vmean:          84.000 cm/s AV VTI:            0.229 m AV Peak Grad:      6.0 mmHg AV Mean Grad:      3.0 mmHg LVOT Vmax:         78.30 cm/s LVOT Vmean:        47.900 cm/s LVOT VTI:          0.165 m LVOT/AV VTI ratio: 0.72  AORTA Ao Root diam: 2.80 cm MITRAL VALVE MV Area (PHT): 2.87 cm    SHUNTS MV Decel Time: 264 msec    Systemic VTI:  0.16 m MV E velocity: 65.00 cm/s  Systemic Diam: 2.00 cm MV A velocity: 84.90 cm/s MV E/A ratio:  0.77 Harrell Gave End MD Electronically signed by Nelva Bush MD Signature Date/Time: 12/02/2019/4:16:39 PM    Final     PERFORMANCE STATUS (ECOG) : 1 - Symptomatic but completely ambulatory  Review of Systems Unless otherwise noted, a complete review of systems is negative.  Physical Exam General: NAD Pulmonary: Unlabored Extremities: no edema, no joint deformities Skin: no rashes Neurological: Weakness but otherwise nonfocal  IMPRESSION: I met with patient in the infusion area with assistance of an interpreter.  PET scan revealed significant improvement in her overall cancer.   Patient says that she has been taking Prozac without issue.  She says that her moods are improved on it.  She does appear to have a brighter affect today.  She does still endorse periods of "panic."  We will refill her lorazepam.  Also encouraged her to reach out to Otelia Limes at PCP office for counseling.   We also discussed [facility] for support of her children.   PLAN: -Continue current scope of treatment -Continue Prozac 10 mg daily -Continue trazodone and gabapentin -Refill lorazepam 0.5mg  TID PRN, #30 -Follow-up MyChart visit in about a month  Case and plan discussed with Dr. Janese Banks  Patient expressed understanding and was in agreement with this plan. She also understands that She can call the clinic at any time with any questions, concerns, or complaints.     Time Total: 20 minutes  Visit  consisted of counseling and education dealing with the complex and emotionally intense issues of symptom management and palliative care in the setting of serious and potentially life-threatening illness.Greater than 50%  of this time was spent counseling and coordinating care related to the above assessment and plan.  Signed by: Altha Harm, PhD, NP-C

## 2019-12-29 ENCOUNTER — Other Ambulatory Visit: Payer: Self-pay | Admitting: *Deleted

## 2019-12-29 MED ORDER — OXYCODONE HCL ER 10 MG PO T12A
10.0000 mg | EXTENDED_RELEASE_TABLET | Freq: Two times a day (BID) | ORAL | 0 refills | Status: DC
Start: 2019-12-29 — End: 2020-01-30

## 2020-01-02 ENCOUNTER — Other Ambulatory Visit: Payer: Self-pay

## 2020-01-02 ENCOUNTER — Inpatient Hospital Stay: Payer: Self-pay

## 2020-01-02 ENCOUNTER — Encounter: Payer: Self-pay | Admitting: Oncology

## 2020-01-02 ENCOUNTER — Inpatient Hospital Stay (HOSPITAL_BASED_OUTPATIENT_CLINIC_OR_DEPARTMENT_OTHER): Payer: Self-pay | Admitting: Oncology

## 2020-01-02 VITALS — Temp 98.9°F

## 2020-01-02 VITALS — BP 109/74 | HR 79 | Resp 16 | Wt 129.5 lb

## 2020-01-02 DIAGNOSIS — Z23 Encounter for immunization: Secondary | ICD-10-CM

## 2020-01-02 DIAGNOSIS — Z5111 Encounter for antineoplastic chemotherapy: Secondary | ICD-10-CM

## 2020-01-02 DIAGNOSIS — Z7983 Long term (current) use of bisphosphonates: Secondary | ICD-10-CM

## 2020-01-02 DIAGNOSIS — C50919 Malignant neoplasm of unspecified site of unspecified female breast: Secondary | ICD-10-CM

## 2020-01-02 DIAGNOSIS — C7951 Secondary malignant neoplasm of bone: Secondary | ICD-10-CM

## 2020-01-02 DIAGNOSIS — F419 Anxiety disorder, unspecified: Secondary | ICD-10-CM

## 2020-01-02 DIAGNOSIS — M8440XA Pathological fracture, unspecified site, initial encounter for fracture: Secondary | ICD-10-CM

## 2020-01-02 DIAGNOSIS — G893 Neoplasm related pain (acute) (chronic): Secondary | ICD-10-CM

## 2020-01-02 LAB — COMPREHENSIVE METABOLIC PANEL
ALT: 32 U/L (ref 0–44)
AST: 40 U/L (ref 15–41)
Albumin: 3.5 g/dL (ref 3.5–5.0)
Alkaline Phosphatase: 62 U/L (ref 38–126)
Anion gap: 7 (ref 5–15)
BUN: 10 mg/dL (ref 6–20)
CO2: 24 mmol/L (ref 22–32)
Calcium: 8.5 mg/dL — ABNORMAL LOW (ref 8.9–10.3)
Chloride: 106 mmol/L (ref 98–111)
Creatinine, Ser: 0.47 mg/dL (ref 0.44–1.00)
GFR, Estimated: >60 mL/min (ref >60)
Glucose, Bld: 127 mg/dL — ABNORMAL HIGH (ref 70–99)
Potassium: 3.6 mmol/L (ref 3.5–5.1)
Sodium: 137 mmol/L (ref 135–145)
Total Bilirubin: 0.5 mg/dL (ref 0.3–1.2)
Total Protein: 6.4 g/dL — ABNORMAL LOW (ref 6.5–8.1)

## 2020-01-02 LAB — CBC WITH DIFFERENTIAL/PLATELET
Abs Immature Granulocytes: 0.06 10*3/uL (ref 0.00–0.07)
Basophils Absolute: 0 10*3/uL (ref 0.0–0.1)
Basophils Relative: 0 %
Eosinophils Absolute: 0.1 10*3/uL (ref 0.0–0.5)
Eosinophils Relative: 2 %
HCT: 33.2 % — ABNORMAL LOW (ref 36.0–46.0)
Hemoglobin: 11.1 g/dL — ABNORMAL LOW (ref 12.0–15.0)
Immature Granulocytes: 1 %
Lymphocytes Relative: 16 %
Lymphs Abs: 0.9 10*3/uL (ref 0.7–4.0)
MCH: 28.4 pg (ref 26.0–34.0)
MCHC: 33.4 g/dL (ref 30.0–36.0)
MCV: 84.9 fL (ref 80.0–100.0)
Monocytes Absolute: 0.3 10*3/uL (ref 0.1–1.0)
Monocytes Relative: 5 %
Neutro Abs: 4 10*3/uL (ref 1.7–7.7)
Neutrophils Relative %: 76 %
Platelets: 339 10*3/uL (ref 150–400)
RBC: 3.91 MIL/uL (ref 3.87–5.11)
RDW: 16.5 % — ABNORMAL HIGH (ref 11.5–15.5)
WBC: 5.3 10*3/uL (ref 4.0–10.5)
nRBC: 0 % (ref 0.0–0.2)

## 2020-01-02 MED ORDER — HEPARIN SOD (PORK) LOCK FLUSH 100 UNIT/ML IV SOLN
500.0000 [IU] | Freq: Once | INTRAVENOUS | Status: AC
  Administered 2020-01-02: 500 [IU] via INTRAVENOUS
  Filled 2020-01-02: qty 5

## 2020-01-02 MED ORDER — SODIUM CHLORIDE 0.9 % IV SOLN
20.0000 mg | Freq: Once | INTRAVENOUS | Status: AC
  Administered 2020-01-02: 20 mg via INTRAVENOUS
  Filled 2020-01-02: qty 20

## 2020-01-02 MED ORDER — HEPARIN SOD (PORK) LOCK FLUSH 100 UNIT/ML IV SOLN
500.0000 [IU] | Freq: Once | INTRAVENOUS | Status: DC | PRN
  Filled 2020-01-02: qty 5

## 2020-01-02 MED ORDER — DIPHENHYDRAMINE HCL 50 MG/ML IJ SOLN
50.0000 mg | Freq: Once | INTRAMUSCULAR | Status: AC
  Administered 2020-01-02: 50 mg via INTRAVENOUS
  Filled 2020-01-02: qty 1

## 2020-01-02 MED ORDER — INFLUENZA VAC SPLIT QUAD 0.5 ML IM SUSY
0.5000 mL | PREFILLED_SYRINGE | Freq: Once | INTRAMUSCULAR | Status: AC
  Administered 2020-01-02: 0.5 mL via INTRAMUSCULAR
  Filled 2020-01-02: qty 0.5

## 2020-01-02 MED ORDER — SODIUM CHLORIDE 0.9 % IV SOLN
Freq: Once | INTRAVENOUS | Status: AC
  Filled 2020-01-02: qty 250

## 2020-01-02 MED ORDER — SODIUM CHLORIDE 0.9 % IV SOLN
65.0000 mg/m2 | Freq: Once | INTRAVENOUS | Status: AC
  Administered 2020-01-02: 108 mg via INTRAVENOUS
  Filled 2020-01-02: qty 18

## 2020-01-02 MED ORDER — INFLUENZA VAC SPLIT QUAD 0.5 ML IM SUSY: 0.5000 mL | PREFILLED_SYRINGE | Freq: Once | INTRAMUSCULAR | Status: DC

## 2020-01-02 MED ORDER — HEPARIN SOD (PORK) LOCK FLUSH 100 UNIT/ML IV SOLN
INTRAVENOUS | Status: AC
  Filled 2020-01-02: qty 5

## 2020-01-02 MED ORDER — FAMOTIDINE IN NACL 20-0.9 MG/50ML-% IV SOLN
20.0000 mg | Freq: Once | INTRAVENOUS | Status: AC
  Administered 2020-01-02: 20 mg via INTRAVENOUS
  Filled 2020-01-02: qty 50

## 2020-01-02 MED ORDER — DENOSUMAB 120 MG/1.7ML ~~LOC~~ SOLN
120.0000 mg | Freq: Once | SUBCUTANEOUS | Status: AC
  Administered 2020-01-02: 120 mg via SUBCUTANEOUS
  Filled 2020-01-02: qty 1.7

## 2020-01-02 MED ORDER — SODIUM CHLORIDE 0.9% FLUSH
10.0000 mL | INTRAVENOUS | Status: DC | PRN
  Administered 2020-01-02: 10 mL via INTRAVENOUS
  Filled 2020-01-02: qty 10

## 2020-01-07 NOTE — Progress Notes (Signed)
Hematology/Oncology Consult note [facility]  Telephone:([phone] [phone]  Patient Care Team: [place], [place], NP as PCP - General Rico Junker, RN as Registered Nurse Theodore Demark, RN as Registered Nurse   Name of the patient: Sophia [name]  [id number]  19-Nov-1980   Date of visit: 01/07/20  Diagnosis- stage IV metastatic breast cancer ER/PR negative HER-2/neu positive with bone metastases  Chief complaint/ Reason for visit-on treatment assessment prior to cycle 6-day 15 of Taxol   Heme/Onc history: patient is a 39 year old Hispanic female who is here with her friend. History obtained with the help of an interpreter.Patient self palpated left breast mass which was followed by a diagnostic bilateral mammogram. Mammogram showed 3.1 x 2.9 x 1.9 cm hypoechoic mass at the 1 o'clock position of the left breast. For abnormal cortically thickened left axillary lymph nodes measuring up to 5 mm. 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 Patient was also having ongoing back pain and was seen by [facility] orthopedics Dr. Doyle Askew who ordered MRI lumbar spine without contrast which showed possible pathologic fractures of L1 and L4 vertebral bodies with greater than 50% height loss at L1 and abnormal signal involving L2-L3 S1 as well as right iliac bone concerning for metastatic disease.Patient is a single mother of 3 adult children and is very anxious today.She reports significant back pain which radiates to her bilateral thighs. Denies any focal tingling numbness or weakness. Denies any bowel bladder incontinence. Pain has been uncontrolled despite taking Tylenol. No prior history of abnormal breast biopsies. No family history of breast cancer  PET and MRIshowed 3 areas of pathologic fracture of her spine as well  as widespread bony metastatic disease and concern for impending fracture of the right hip. Given her worsening pain she was asked to come to the ER. She has been evaluated by Dr. Rudene Christians from orthopedic surgery and underwent kyphoplasty at 3 different levels.T6 L1 and L4 along with radiofrequency ablation.She also underwent prophylactic fixation of the right hip and not affected the sacral region.   Patient received first dose of Herceptin and Perjeta on 09/04/2019. Baseline echocardiogram normal.she is currently getting taxol/ herceptin/perjeta  Patient admitted to hospital for acute abdominal pain with CT findings concerning for acute colitis. Colonoscopy showed diffuse severe inflammation with erythema friability and loss of vascularity and shallow ulcerations in the IC valve, ascending colon and cecum.   Interval history-patient reports that her mood is better controlled after starting Prozac.  Has chronic low back pain and right lower extremity pain for which she takes as needed oxycodone and is currently well controlled.  ECOG PS- 1 Pain scale- 2 Opioid associated constipation- no  Review of systems- Review of Systems  Constitutional: Positive for malaise/fatigue. Negative for chills, fever and weight loss.  HENT: Negative for congestion, ear discharge and nosebleeds.   Eyes: Negative for blurred vision.  Respiratory: Negative for cough, hemoptysis, sputum production, shortness of breath and wheezing.   Cardiovascular: Negative for chest pain, palpitations, orthopnea and claudication.  Gastrointestinal: Negative for abdominal pain, blood in stool, constipation, diarrhea, heartburn, melena, nausea and vomiting.  Genitourinary: Negative for dysuria, flank pain, frequency, hematuria and urgency.  Musculoskeletal: Positive for back pain. Negative for joint pain and myalgias.  Skin: Negative for rash.  Neurological: Negative for dizziness, tingling, focal weakness, seizures,  weakness and  headaches.  Endo/Heme/Allergies: Does not bruise/bleed easily.  Psychiatric/Behavioral: Negative for depression and suicidal ideas. The patient does not have insomnia.       No Known Allergies   Past Medical History:  Diagnosis Date  . Anxiety   . Breast cancer ([place])    with mets  . Cancer ([place])   . Colitis   . Vertigo      Past Surgical History:  Procedure Laterality Date  . BREAST BIOPSY Left 08/14/2019   [place] bx of mass, coil marker, path pending  . BREAST BIOPSY Left 08/14/2019   [place] bx of LN, hydromarker, path pending  . BREAST BIOPSY Left 08/14/2019   affirm bx of calcs, x marker, path pending  . ESOPHAGOGASTRODUODENOSCOPY (EGD) WITH PROPOFOL N/A 10/05/2019   Procedure: ESOPHAGOGASTRODUODENOSCOPY (EGD) WITH PROPOFOL;  Surgeon: Lin Landsman, MD;  Location: [place];  Service: Gastroenterology;  Laterality: N/A;  . FLEXIBLE SIGMOIDOSCOPY N/A 10/05/2019   Procedure: FLEXIBLE SIGMOIDOSCOPY;  Surgeon: Lin Landsman, MD;  Location: [facility] ENDOSCOPY;  Service: Gastroenterology;  Laterality: N/A;  . INTRAMEDULLARY (IM) NAIL INTERTROCHANTERIC Right 09/01/2019   Procedure: INTRAMEDULLARY (IM) NAIL INTERTROCHANTRIC AND RADIOFREQUENCY ABLATION;  Surgeon: Hessie Knows, MD;  Location: [facility] ORS;  Service: Orthopedics;  Laterality: Right;  . KYPHOPLASTY N/A 08/29/2019   Procedure: KYPHOPLASTY T6, L1,L4 ,  RADIOFREQUENCY ABLATION;  Surgeon: Hessie Knows, MD;  Location: [facility] ORS;  Service: Orthopedics;  Laterality: N/A;  . KYPHOPLASTY Right 09/01/2019   Procedure: Right Sacral Radiofrequency Ablation and Cement Augmentation, Right sacrum and iliac crest;  Surgeon: Hessie Knows, MD;  Location: [facility] ORS;  Service: Orthopedics;  Laterality: Right;  . PORTA CATH INSERTION N/A 08/28/2019   Procedure: PORTA CATH INSERTION;  Surgeon: Algernon Huxley, MD;  Location: [place] [facility];  Service: Cardiovascular;  Laterality: N/A;    Social History   Socioeconomic History   . Marital status: Single    Spouse name: Not on file  . Number of children: Not on file  . Years of education: Not on file  . Highest education level: Not on file  Occupational History  . Not on file  Tobacco Use  . Smoking status: Never Smoker  . Smokeless tobacco: Never Used  Vaping Use  . Vaping Use: Never used  Substance and Sexual Activity  . Alcohol use: Not Currently  . Drug use: Not Currently  . Sexual activity: Not Currently    Birth control/protection: None  Other Topics Concern  . Not on file  Social History Narrative   Lives at home with children   Social Determinants of Health   Financial Resource Strain:   . Difficulty of Paying Living Expenses: Not on file  Food Insecurity:   . Worried About Charity fundraiser in the Last Year: Not on file  . Ran Out of Food in the Last Year: Not on file  Transportation Needs:   . Lack of Transportation (Medical): Not on file  . Lack of Transportation (Non-Medical): Not on file  Physical Activity:   . Days of Exercise per Week: Not on file  . Minutes of Exercise per Session: Not on file  Stress:   . Feeling of Stress : Not on file  Social Connections:   . Frequency of Communication with Friends and Family: Not on file  . Frequency of Social Gatherings with Friends and Family: Not on file  . Attends Religious Services: Not on file  . Active Member of Clubs or Organizations: Not on file  .  Attends Banker Meetings: Not on file  . Marital Status: Not on file  Intimate Partner Violence:   . Fear of Current or Ex-Partner: Not on file  . Emotionally Abused: Not on file  . Physically Abused: Not on file  . Sexually Abused: Not on file    Family History  Problem Relation Age of Onset  . Colon cancer Maternal Uncle      Current Outpatient Medications:  .  gabapentin (NEURONTIN) 300 MG capsule, Take 1 capsule (300 mg total) by mouth 2 (two) times daily., Disp: 60 capsule, Rfl: 1 .  lidocaine-prilocaine  (EMLA) cream, Apply 1 application topically as needed. Apply small amount to port site at least 1 hour prior to it being accessed, cover with plastic wrap, Disp: 30 g, Rfl: 1 .  oxyCODONE (OXYCONTIN) 10 mg 12 hr tablet, Take 1 tablet (10 mg total) by mouth every 12 (twelve) hours., Disp: 60 tablet, Rfl: 0 .  traZODone (DESYREL) 100 MG tablet, Take 1 tablet (100 mg total) by mouth at bedtime., Disp: 30 tablet, Rfl: 2 .  clindamycin (CLINDAGEL) 1 % gel, Apply topically 2 (two) times daily. X 7 days. (Patient not taking: Reported on 01/02/2020), Disp: 30 g, Rfl: 0 .  diphenoxylate-atropine (LOMOTIL) 2.5-0.025 MG tablet, Take 1 tablet by mouth 4 (four) times daily as needed for diarrhea or loose stools. (Patient not taking: Reported on 01/02/2020), Disp: 30 tablet, Rfl: 0 .  LORazepam (ATIVAN) 0.5 MG tablet, Take 1 tablet (0.5 mg total) by mouth every 8 (eight) hours. (Patient not taking: Reported on 01/02/2020), Disp: 30 tablet, Rfl: 0  Current Facility-Administered Medications:  .  influenza vac split quadrivalent PF (FLUARIX) injection 0.5 mL, 0.5 mL, Intramuscular, Once, Creig Hines, MD  Physical exam:  Vitals:   01/02/20 0933  BP: 109/74  Pulse: 79  Resp: 16  SpO2: 100%  Weight: 129 lb 8 oz (58.7 kg)   Physical Exam Constitutional:      General: She is not in acute distress. Eyes:     Pupils: Pupils are equal, round, and reactive to light.  Cardiovascular:     Rate and Rhythm: Normal rate and regular rhythm.     Heart sounds: Normal heart sounds.  Pulmonary:     Effort: Pulmonary effort is normal.     Breath sounds: Normal breath sounds.  Abdominal:     General: Bowel sounds are normal.     Palpations: Abdomen is soft.  Skin:    General: Skin is warm and dry.  Neurological:     Mental Status: She is alert and oriented to person, place, and time.      CMP Latest Ref Rng & Units 01/02/2020  Glucose 70 - 99 mg/dL 276(D)  BUN 6 - 20 mg/dL 10  Creatinine 4.70 - 9.29 mg/dL  5.74  Sodium 734 - 037 mmol/L 137  Potassium 3.5 - 5.1 mmol/L 3.6  Chloride 98 - 111 mmol/L 106  CO2 22 - 32 mmol/L 24  Calcium 8.9 - 10.3 mg/dL 0.9(U)  Total Protein 6.5 - 8.1 g/dL 6.4(L)  Total Bilirubin 0.3 - 1.2 mg/dL 0.5  Alkaline Phos 38 - 126 U/L 62  AST 15 - 41 U/L 40  ALT 0 - 44 U/L 32   CBC Latest Ref Rng & Units 01/02/2020  WBC 4.0 - 10.5 K/uL 5.3  Hemoglobin 12.0 - 15.0 g/dL 11.1(L)  Hematocrit 36 - 46 % 33.2(L)  Platelets 150 - 400 K/uL 339    No images  are attached to the encounter.  MR Brain W Wo Contrast  Result Date: 12/11/2019 CLINICAL DATA:  Follow-up evaluation for metastatic breast cancer, surveillance. EXAM: MRI HEAD WITHOUT AND WITH CONTRAST TECHNIQUE: Multiplanar, multiecho pulse sequences of the brain and surrounding structures were obtained without and with intravenous contrast. CONTRAST:  70mL GADAVIST GADOBUTROL 1 MMOL/ML IV SOLN COMPARISON:  Comparison made with prior brain MRI from 08/19/2019 as well as previous cervical spine MRI from 08/25/2019. FINDINGS: Brain: Cerebral volume within normal limits for patient age. No focal parenchymal signal abnormality identified. No abnormal foci of restricted diffusion to suggest acute or subacute ischemia. Gray-white matter differentiation well maintained. No encephalomalacia to suggest chronic infarction. No foci of susceptibility artifact to suggest acute or chronic intracranial hemorrhage. No mass lesion, midline shift or mass effect. No hydrocephalus. Incidental note made of a 12 mm arachnoid cyst at the anterior left middle cranial fossa (series 10, image 10), of doubtful significance. No other extra-axial fluid collection. Major dural sinuses are grossly patent. Pituitary gland and suprasellar region are normal. Midline structures intact and normal. No abnormal enhancement within the brain itself. Vascular: Major intracranial vascular flow voids well maintained and normal in appearance. Skull and upper cervical spine:  Craniocervical junction within normal limits. Known metastatic lesion involving the dens again seen, measuring 12 mm (series 9, image 12). Previously seen metastasis involving the left occipital condyle not well seen on today's exam, but grossly similar. There is a 1 cm enhancing lesion at the left frontal calvarium, most likely reflecting an osseous metastasis (series 18, image 131). No extra osseous extension. No other definite focal area of placing lesions. Sinuses/Orbits: Globes and orbital soft tissues within normal limits. Mild scattered mucosal thickening noted within the ethmoidal air cells. Paranasal sinuses are otherwise clear. No mastoid effusion. Inner ear structures grossly normal. Other: None. IMPRESSION: 1. 1 cm enhancing lesion at the left frontal calvarium, consistent with an osseous metastasis. In retrospect, finding was likely present on prior MRI from 08/29/2019, although is slightly more prominent on today's study. 2. Additional osseous metastases involving the dens and left occipital condyle, similar to previous cervical spine MRI. 3. Otherwise normal brain MRI, with no other intracranial metastatic disease identified. Electronically Signed   By: Jeannine Boga M.D.   On: 12/11/2019 03:40   NM PET Image Restag (PS) Skull Base To Thigh  Result Date: 12/17/2019 CLINICAL DATA:  Subsequent treatment strategy for breast cancer. EXAM: NUCLEAR MEDICINE PET SKULL BASE TO THIGH TECHNIQUE: 6.8 mCi F-18 FDG was injected intravenously. Full-ring PET imaging was performed from the skull base to thigh after the radiotracer. CT data was obtained and used for attenuation correction and anatomic localization. Fasting blood glucose: 86 mg/dl COMPARISON:  Abdomen/pelvis CT 10/01/2019.  PET-CT 08/27/2019. FINDINGS: Mediastinal blood pool activity: SUV max 2.1 Liver activity: SUV max NA NECK: No hypermetabolic lymph nodes in the neck. Incidental CT findings: none CHEST: Interval resolution of left  supraclavicular hypermetabolic node identified previously. Similar marked interval response to therapy of the left subpectoral and axillary lymphadenopathy. No residual hypermetabolic lymphadenopathy in this region today. Index 7 mm short axis left axillary node described on the previous study has no measurable lymph node at this location today. 6 mm short axis right axillary node shows low level FDG uptake with SUV max = 2.2. Low level activity is seen and additional small right axillary nodes. Given the reported history of right upper extremity COVID vaccination on 11/24/2019, this probably reflects reactive change given the generalized marked  interval improvement in known metastatic disease elsewhere. Incidental CT findings: Right Port-A-Cath tip is positioned in the right atrium near the tricuspid valve. Dependent atelectasis noted in the lung bases. ABDOMEN/PELVIS: No abnormal hypermetabolic activity within the liver, pancreas, adrenal glands, or spleen. No hypermetabolic lymph nodes in the abdomen or pelvis. Incidental CT findings: none SKELETON: Marked interval decrease in hypermetabolic skeletal metastases. Interval vertebral augmentation at T6, L1, and L4. No residual hypermetabolism at this level. Index right sacral lesion with SUV max = 11.5 previously now shows SUV max = 1.9. Evidence of sacroplasty associated. Index left iliac lesion, involving the roof of the left acetabulum has similarly improved. Previously, SUV max = 13.7, now SUV max = 2.0 (at blood pool level). Lesion is now sclerotic suggesting interval healing. The diffuse hypermetabolic spinal metastases seen previously are similarly improved with only background marrow activity visible on today's exam. Orthopedic hardware now noted right femoral neck. Incidental CT findings: none IMPRESSION: 1. Marked interval response to therapy. 2. Hypermetabolic left supraclavicular, subpectoral, and axillary lymphadenopathy has resolved with no residual  hypermetabolism or lymphadenopathy by CT size criteria. 3. Tiny lymph nodes in the right axilla show low level FDG uptake (at or just above blood pool) likely secondary to recent COVID vaccination in the right upper extremity. Close attention on follow-up recommended. 4. Widespread hypermetabolic bony metastases show no residual hypermetabolism on today's exam consistent with marked interval response. Lytic bone lesion seen previously have become sclerotic in the interval suggesting associated healing. 5. No new or residual findings to suggest hypermetabolic metastases on today's study. Electronically Signed   By: Misty Stanley M.D.   On: 12/17/2019 09:24     Assessment and plan- Patient is a 39 y.o. female with metastatic ER negative her 2 positive breast cancer on taxol/herceptin and perjeta.  So far tolerating treatment well without significant side effects.  Denies any neuropathy.  Counts are otherwise okay to proceed with cycle 6-day 15 of Taxol today.  She will directly proceed for cycle 7-day 1 of Taxol Herceptin and Perjeta next week and I will see her back in 2 weeks for cycle 7-day 8 of Taxol alone.  Patient gets occasional diarrhea with Herceptin and Perjeta which is well controlled with as needed Imodium  Continue as needed oxycodone for neoplasm related pain  She is on Prozac for depression and trazodone for insomnia  Bone metastases: We will get Delton See today Visit Diagnosis 1. Need for prophylactic vaccination and inoculation against influenza   2. Metastatic breast cancer ([place])   3. Anxiety   4. Encounter for antineoplastic chemotherapy   5. Neoplasm related pain   6. Bone metastases ([place])   7. Long term current use of bisphosphonates      Dr. Randa Evens, MD, MPH [facility] [phone] 01/07/2020 11:11 AM

## 2020-01-09 ENCOUNTER — Other Ambulatory Visit: Payer: Self-pay

## 2020-01-09 ENCOUNTER — Ambulatory Visit: Payer: Self-pay | Attending: Internal Medicine

## 2020-01-09 ENCOUNTER — Inpatient Hospital Stay: Payer: Self-pay

## 2020-01-09 ENCOUNTER — Other Ambulatory Visit: Payer: Self-pay | Admitting: *Deleted

## 2020-01-09 VITALS — BP 103/58 | HR 84 | Temp 98.0°F | Resp 16 | Wt 132.4 lb

## 2020-01-09 DIAGNOSIS — Z95828 Presence of other vascular implants and grafts: Secondary | ICD-10-CM

## 2020-01-09 DIAGNOSIS — Z23 Encounter for immunization: Secondary | ICD-10-CM

## 2020-01-09 DIAGNOSIS — M8440XA Pathological fracture, unspecified site, initial encounter for fracture: Secondary | ICD-10-CM

## 2020-01-09 DIAGNOSIS — C50919 Malignant neoplasm of unspecified site of unspecified female breast: Secondary | ICD-10-CM

## 2020-01-09 LAB — COMPREHENSIVE METABOLIC PANEL
ALT: 35 U/L (ref 0–44)
AST: 40 U/L (ref 15–41)
Albumin: 3.4 g/dL — ABNORMAL LOW (ref 3.5–5.0)
Alkaline Phosphatase: 63 U/L (ref 38–126)
Anion gap: 6 (ref 5–15)
BUN: 9 mg/dL (ref 6–20)
CO2: 25 mmol/L (ref 22–32)
Calcium: 8.4 mg/dL — ABNORMAL LOW (ref 8.9–10.3)
Chloride: 106 mmol/L (ref 98–111)
Creatinine, Ser: 0.47 mg/dL (ref 0.44–1.00)
GFR, Estimated: >60 mL/min (ref >60)
Glucose, Bld: 126 mg/dL — ABNORMAL HIGH (ref 70–99)
Potassium: 3.5 mmol/L (ref 3.5–5.1)
Sodium: 137 mmol/L (ref 135–145)
Total Bilirubin: 0.4 mg/dL (ref 0.3–1.2)
Total Protein: 6.5 g/dL (ref 6.5–8.1)

## 2020-01-09 LAB — CBC WITH DIFFERENTIAL/PLATELET
Abs Immature Granulocytes: 0.06 10*3/uL (ref 0.00–0.07)
Basophils Absolute: 0 10*3/uL (ref 0.0–0.1)
Basophils Relative: 1 %
Eosinophils Absolute: 0.2 10*3/uL (ref 0.0–0.5)
Eosinophils Relative: 3 %
HCT: 33.9 % — ABNORMAL LOW (ref 36.0–46.0)
Hemoglobin: 11.2 g/dL — ABNORMAL LOW (ref 12.0–15.0)
Immature Granulocytes: 1 %
Lymphocytes Relative: 30 %
Lymphs Abs: 1.4 10*3/uL (ref 0.7–4.0)
MCH: 28.2 pg (ref 26.0–34.0)
MCHC: 33 g/dL (ref 30.0–36.0)
MCV: 85.4 fL (ref 80.0–100.0)
Monocytes Absolute: 0.3 10*3/uL (ref 0.1–1.0)
Monocytes Relative: 6 %
Neutro Abs: 2.9 10*3/uL (ref 1.7–7.7)
Neutrophils Relative %: 59 %
Platelets: 323 10*3/uL (ref 150–400)
RBC: 3.97 MIL/uL (ref 3.87–5.11)
RDW: 16.7 % — ABNORMAL HIGH (ref 11.5–15.5)
WBC: 4.8 10*3/uL (ref 4.0–10.5)
nRBC: 0 % (ref 0.0–0.2)

## 2020-01-09 MED ORDER — ACETAMINOPHEN 325 MG PO TABS
650.0000 mg | ORAL_TABLET | Freq: Once | ORAL | Status: AC
  Administered 2020-01-09: 650 mg via ORAL
  Filled 2020-01-09: qty 2

## 2020-01-09 MED ORDER — SODIUM CHLORIDE 0.9% FLUSH
10.0000 mL | INTRAVENOUS | Status: DC | PRN
  Administered 2020-01-09: 10 mL via INTRAVENOUS
  Filled 2020-01-09: qty 10

## 2020-01-09 MED ORDER — HEPARIN SOD (PORK) LOCK FLUSH 100 UNIT/ML IV SOLN
INTRAVENOUS | Status: AC
  Filled 2020-01-09: qty 5

## 2020-01-09 MED ORDER — TRASTUZUMAB CHEMO 150 MG IV SOLR
6.0000 mg/kg | Freq: Once | INTRAVENOUS | Status: AC
  Administered 2020-01-09: 357 mg via INTRAVENOUS
  Filled 2020-01-09: qty 17

## 2020-01-09 MED ORDER — SODIUM CHLORIDE 0.9 % IV SOLN
20.0000 mg | Freq: Once | INTRAVENOUS | Status: AC
  Administered 2020-01-09: 20 mg via INTRAVENOUS
  Filled 2020-01-09: qty 20

## 2020-01-09 MED ORDER — SODIUM CHLORIDE 0.9 % IV SOLN
65.0000 mg/m2 | Freq: Once | INTRAVENOUS | Status: AC
  Administered 2020-01-09: 108 mg via INTRAVENOUS
  Filled 2020-01-09: qty 18

## 2020-01-09 MED ORDER — SODIUM CHLORIDE 0.9 % IV SOLN
Freq: Once | INTRAVENOUS | Status: AC
  Filled 2020-01-09: qty 250

## 2020-01-09 MED ORDER — DIPHENHYDRAMINE HCL 50 MG/ML IJ SOLN
50.0000 mg | Freq: Once | INTRAMUSCULAR | Status: AC
  Administered 2020-01-09: 50 mg via INTRAVENOUS
  Filled 2020-01-09: qty 1

## 2020-01-09 MED ORDER — SODIUM CHLORIDE 0.9% FLUSH
10.0000 mL | INTRAVENOUS | Status: DC | PRN
  Administered 2020-01-09 (×2): 10 mL
  Filled 2020-01-09: qty 10

## 2020-01-09 MED ORDER — FAMOTIDINE IN NACL 20-0.9 MG/50ML-% IV SOLN
20.0000 mg | Freq: Once | INTRAVENOUS | Status: AC
  Administered 2020-01-09: 20 mg via INTRAVENOUS
  Filled 2020-01-09: qty 50

## 2020-01-09 MED ORDER — FLUOXETINE HCL 10 MG PO CAPS
10.0000 mg | ORAL_CAPSULE | Freq: Every day | ORAL | 1 refills | Status: DC
Start: 2020-01-09 — End: 2020-03-16

## 2020-01-09 MED ORDER — SODIUM CHLORIDE 0.9 % IV SOLN
420.0000 mg | Freq: Once | INTRAVENOUS | Status: AC
  Administered 2020-01-09: 420 mg via INTRAVENOUS
  Filled 2020-01-09: qty 14

## 2020-01-09 MED ORDER — HEPARIN SOD (PORK) LOCK FLUSH 100 UNIT/ML IV SOLN
500.0000 [IU] | Freq: Once | INTRAVENOUS | Status: AC | PRN
  Administered 2020-01-09: 500 [IU]
  Filled 2020-01-09: qty 5

## 2020-01-09 NOTE — Progress Notes (Signed)
   Covid-19 Vaccination Clinic  Name:  [name]    MRN: [id number] DOB: 1980/04/16  01/09/2020  Ms. [name] was observed post Covid-19 immunization for 15 minutes without incident. She was provided with Vaccine Information Sheet and instruction to access the V-Safe system.   Ms. [name] was instructed to call 911 with any severe reactions post vaccine: Marland Kitchen Difficulty breathing  . Swelling of face and throat  . A fast heartbeat  . A bad rash all over body  . Dizziness and weakness

## 2020-01-10 LAB — CANCER ANTIGEN 15-3: CA 15-3: 11.5 U/mL (ref 0.0–25.0)

## 2020-01-10 LAB — CANCER ANTIGEN 27.29: CA 27.29: 9.9 U/mL (ref 0.0–38.6)

## 2020-01-16 ENCOUNTER — Inpatient Hospital Stay (HOSPITAL_BASED_OUTPATIENT_CLINIC_OR_DEPARTMENT_OTHER): Payer: Self-pay | Admitting: Oncology

## 2020-01-16 ENCOUNTER — Inpatient Hospital Stay: Payer: Self-pay

## 2020-01-16 ENCOUNTER — Encounter: Payer: Self-pay | Admitting: Oncology

## 2020-01-16 ENCOUNTER — Other Ambulatory Visit: Payer: Self-pay

## 2020-01-16 VITALS — BP 108/68 | HR 77 | Temp 95.5°F | Resp 16 | Wt 130.3 lb

## 2020-01-16 DIAGNOSIS — C50919 Malignant neoplasm of unspecified site of unspecified female breast: Secondary | ICD-10-CM

## 2020-01-16 DIAGNOSIS — G893 Neoplasm related pain (acute) (chronic): Secondary | ICD-10-CM

## 2020-01-16 DIAGNOSIS — M8440XA Pathological fracture, unspecified site, initial encounter for fracture: Secondary | ICD-10-CM

## 2020-01-16 DIAGNOSIS — F419 Anxiety disorder, unspecified: Secondary | ICD-10-CM

## 2020-01-16 DIAGNOSIS — F32A Depression, unspecified: Secondary | ICD-10-CM

## 2020-01-16 DIAGNOSIS — Z5111 Encounter for antineoplastic chemotherapy: Secondary | ICD-10-CM

## 2020-01-16 LAB — CBC WITH DIFFERENTIAL/PLATELET
Abs Immature Granulocytes: 0.06 10*3/uL (ref 0.00–0.07)
Basophils Absolute: 0 10*3/uL (ref 0.0–0.1)
Basophils Relative: 1 %
Eosinophils Absolute: 0.1 10*3/uL (ref 0.0–0.5)
Eosinophils Relative: 3 %
HCT: 33.1 % — ABNORMAL LOW (ref 36.0–46.0)
Hemoglobin: 10.8 g/dL — ABNORMAL LOW (ref 12.0–15.0)
Immature Granulocytes: 1 %
Lymphocytes Relative: 17 %
Lymphs Abs: 0.9 10*3/uL (ref 0.7–4.0)
MCH: 28.1 pg (ref 26.0–34.0)
MCHC: 32.6 g/dL (ref 30.0–36.0)
MCV: 86.2 fL (ref 80.0–100.0)
Monocytes Absolute: 0.4 10*3/uL (ref 0.1–1.0)
Monocytes Relative: 6 %
Neutro Abs: 4 10*3/uL (ref 1.7–7.7)
Neutrophils Relative %: 72 %
Platelets: 351 10*3/uL (ref 150–400)
RBC: 3.84 MIL/uL — ABNORMAL LOW (ref 3.87–5.11)
RDW: 16.3 % — ABNORMAL HIGH (ref 11.5–15.5)
WBC: 5.4 10*3/uL (ref 4.0–10.5)
nRBC: 0 % (ref 0.0–0.2)

## 2020-01-16 LAB — COMPREHENSIVE METABOLIC PANEL
ALT: 36 U/L (ref 0–44)
AST: 36 U/L (ref 15–41)
Albumin: 3.6 g/dL (ref 3.5–5.0)
Alkaline Phosphatase: 59 U/L (ref 38–126)
Anion gap: 6 (ref 5–15)
BUN: 12 mg/dL (ref 6–20)
CO2: 25 mmol/L (ref 22–32)
Calcium: 9 mg/dL (ref 8.9–10.3)
Chloride: 104 mmol/L (ref 98–111)
Creatinine, Ser: 0.53 mg/dL (ref 0.44–1.00)
GFR, Estimated: >60 mL/min (ref >60)
Glucose, Bld: 96 mg/dL (ref 70–99)
Potassium: 3.9 mmol/L (ref 3.5–5.1)
Sodium: 135 mmol/L (ref 135–145)
Total Bilirubin: 0.5 mg/dL (ref 0.3–1.2)
Total Protein: 6.5 g/dL (ref 6.5–8.1)

## 2020-01-16 MED ORDER — SODIUM CHLORIDE 0.9% FLUSH
10.0000 mL | INTRAVENOUS | Status: DC | PRN
  Administered 2020-01-16: 10 mL via INTRAVENOUS
  Filled 2020-01-16: qty 10

## 2020-01-16 MED ORDER — HEPARIN SOD (PORK) LOCK FLUSH 100 UNIT/ML IV SOLN
500.0000 [IU] | Freq: Once | INTRAVENOUS | Status: AC
  Administered 2020-01-16: 500 [IU] via INTRAVENOUS
  Filled 2020-01-16: qty 5

## 2020-01-16 MED ORDER — SODIUM CHLORIDE 0.9 % IV SOLN
Freq: Once | INTRAVENOUS | Status: AC
  Filled 2020-01-16: qty 250

## 2020-01-16 MED ORDER — SODIUM CHLORIDE 0.9 % IV SOLN
65.0000 mg/m2 | Freq: Once | INTRAVENOUS | Status: AC
  Administered 2020-01-16: 108 mg via INTRAVENOUS
  Filled 2020-01-16: qty 18

## 2020-01-16 MED ORDER — FAMOTIDINE IN NACL 20-0.9 MG/50ML-% IV SOLN
20.0000 mg | Freq: Once | INTRAVENOUS | Status: AC
  Administered 2020-01-16: 20 mg via INTRAVENOUS
  Filled 2020-01-16: qty 50

## 2020-01-16 MED ORDER — DIPHENHYDRAMINE HCL 50 MG/ML IJ SOLN
50.0000 mg | Freq: Once | INTRAMUSCULAR | Status: AC
  Administered 2020-01-16: 50 mg via INTRAVENOUS
  Filled 2020-01-16: qty 1

## 2020-01-16 MED ORDER — HEPARIN SOD (PORK) LOCK FLUSH 100 UNIT/ML IV SOLN
500.0000 [IU] | Freq: Once | INTRAVENOUS | Status: DC | PRN
  Filled 2020-01-16: qty 5

## 2020-01-16 MED ORDER — SODIUM CHLORIDE 0.9 % IV SOLN
20.0000 mg | Freq: Once | INTRAVENOUS | Status: AC
  Administered 2020-01-16: 20 mg via INTRAVENOUS
  Filled 2020-01-16: qty 20

## 2020-01-16 NOTE — Progress Notes (Signed)
1235: Pt tolerated infusion well. No s/s of distress or reaction noted. Pt stable at discharge.

## 2020-01-18 NOTE — Progress Notes (Signed)
Hematology/Oncology Consult note [facility]  Telephone:([phone] [phone]  Patient Care Team: [place], [place], NP as PCP - General Rico Junker, RN as Registered Nurse Theodore Demark, RN as Registered Nurse   Name of the patient: Sophia [name]  [id number]  1980-10-17   Date of visit: 01/18/20  Diagnosis- stage IV metastatic breast cancer ER/PR negative HER-2/neu positive with bone metastases  Chief complaint/ Reason for visit-on treatment assessment prior to cycle 7-day 8 of Taxol  Heme/Onc history: patient is a 39 year old Hispanic female who is here with her friend. History obtained with the help of an interpreter.Patient self palpated left breast mass which was followed by a diagnostic bilateral mammogram. Mammogram showed 3.1 x 2.9 x 1.9 cm hypoechoic mass at the 1 o'clock position of the left breast. For abnormal cortically thickened left axillary lymph nodes measuring up to 5 mm. 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 Patient was also having ongoing back pain and was seen by [facility] orthopedics Dr. Doyle Askew who ordered MRI lumbar spine without contrast which showed possible pathologic fractures of L1 and L4 vertebral bodies with greater than 50% height loss at L1 and abnormal signal involving L2-L3 S1 as well as right iliac bone concerning for metastatic disease.Patient is a single mother of 3 adult children and is very anxious today.She reports significant back pain which radiates to her bilateral thighs. Denies any focal tingling numbness or weakness. Denies any bowel bladder incontinence. Pain has been uncontrolled despite taking Tylenol. No prior history of abnormal breast biopsies. No family history of breast cancer  PET and MRIshowed 3 areas of pathologic fracture of her spine as well as  widespread bony metastatic disease and concern for impending fracture of the right hip. Given her worsening pain she was asked to come to the ER. She has been evaluated by Dr. Rudene Christians from orthopedic surgery and underwent kyphoplasty at 3 different levels.T6 L1 and L4 along with radiofrequency ablation.She also underwent prophylactic fixation of the right hip and not affected the sacral region.   Patient received first dose of Herceptin and Perjeta on 09/04/2019. Baseline echocardiogram normal.she is currently getting taxol/ herceptin/perjeta  Patient admitted to hospital for acute abdominal pain with CT findings concerning for acute colitis. Colonoscopy showed diffuse severe inflammation with erythema friability and loss of vascularity and shallow ulcerations in the IC valve, ascending colon and cecum.  Interval history-symptoms of depression currently well controlled with Prozac.  She is using as needed Ativan for anxiety.  Denies any significant nausea.  Appetite and weight have been stable.  Back pain is currently well controlled with as needed oxycodone which she has been using sparingly.  ECOG PS- 1 Pain scale- 3   Review of systems- Review of Systems  Constitutional: Positive for malaise/fatigue. Negative for chills, fever and weight loss.  HENT: Negative for congestion, ear discharge and nosebleeds.   Eyes: Negative for blurred vision.  Respiratory: Negative for cough, hemoptysis, sputum production, shortness of breath and wheezing.   Cardiovascular: Negative for chest pain, palpitations, orthopnea and claudication.  Gastrointestinal: Negative for abdominal pain, blood in stool, constipation, diarrhea, heartburn, melena, nausea and vomiting.  Genitourinary: Negative for dysuria, flank pain, frequency, hematuria and urgency.  Musculoskeletal: Positive for back pain. Negative for joint pain and myalgias.  Skin: Negative for rash.  Neurological: Negative for dizziness,  tingling,  focal weakness, seizures, weakness and headaches.  Endo/Heme/Allergies: Does not bruise/bleed easily.  Psychiatric/Behavioral: Negative for depression and suicidal ideas. The patient is nervous/anxious. The patient does not have insomnia.        No Known Allergies   Past Medical History:  Diagnosis Date  . Anxiety   . Breast cancer ([place])    with mets  . Cancer ([place])   . Colitis   . Vertigo      Past Surgical History:  Procedure Laterality Date  . BREAST BIOPSY Left 08/14/2019   [place] bx of mass, coil marker, path pending  . BREAST BIOPSY Left 08/14/2019   [place] bx of LN, hydromarker, path pending  . BREAST BIOPSY Left 08/14/2019   affirm bx of calcs, x marker, path pending  . ESOPHAGOGASTRODUODENOSCOPY (EGD) WITH PROPOFOL N/A 10/05/2019   Procedure: ESOPHAGOGASTRODUODENOSCOPY (EGD) WITH PROPOFOL;  Surgeon: Lin Landsman, MD;  Location: [place];  Service: Gastroenterology;  Laterality: N/A;  . FLEXIBLE SIGMOIDOSCOPY N/A 10/05/2019   Procedure: FLEXIBLE SIGMOIDOSCOPY;  Surgeon: Lin Landsman, MD;  Location: [facility] ENDOSCOPY;  Service: Gastroenterology;  Laterality: N/A;  . INTRAMEDULLARY (IM) NAIL INTERTROCHANTERIC Right 09/01/2019   Procedure: INTRAMEDULLARY (IM) NAIL INTERTROCHANTRIC AND RADIOFREQUENCY ABLATION;  Surgeon: Hessie Knows, MD;  Location: [facility] ORS;  Service: Orthopedics;  Laterality: Right;  . KYPHOPLASTY N/A 08/29/2019   Procedure: KYPHOPLASTY T6, L1,L4 ,  RADIOFREQUENCY ABLATION;  Surgeon: Hessie Knows, MD;  Location: [facility] ORS;  Service: Orthopedics;  Laterality: N/A;  . KYPHOPLASTY Right 09/01/2019   Procedure: Right Sacral Radiofrequency Ablation and Cement Augmentation, Right sacrum and iliac crest;  Surgeon: Hessie Knows, MD;  Location: [facility] ORS;  Service: Orthopedics;  Laterality: Right;  . PORTA CATH INSERTION N/A 08/28/2019   Procedure: PORTA CATH INSERTION;  Surgeon: Algernon Huxley, MD;  Location: [place] CV LAB;  Service: Cardiovascular;   Laterality: N/A;    Social History   Socioeconomic History  . Marital status: Single    Spouse name: Not on file  . Number of children: Not on file  . Years of education: Not on file  . Highest education level: Not on file  Occupational History  . Not on file  Tobacco Use  . Smoking status: Never Smoker  . Smokeless tobacco: Never Used  Vaping Use  . Vaping Use: Never used  Substance and Sexual Activity  . Alcohol use: Not Currently  . Drug use: Not Currently  . Sexual activity: Not Currently    Birth control/protection: None  Other Topics Concern  . Not on file  Social History Narrative   Lives at home with children   Social Determinants of Health   Financial Resource Strain:   . Difficulty of Paying Living Expenses: Not on file  Food Insecurity:   . Worried About Charity fundraiser in the Last Year: Not on file  . Ran Out of Food in the Last Year: Not on file  Transportation Needs:   . Lack of Transportation (Medical): Not on file  . Lack of Transportation (Non-Medical): Not on file  Physical Activity:   . Days of Exercise per Week: Not on file  . Minutes of Exercise per Session: Not on file  Stress:   . Feeling of Stress : Not on file  Social Connections:   . Frequency of Communication with Friends and Family: Not on file  . Frequency of Social Gatherings with Friends and Family: Not on file  . Attends Religious Services: Not on file  .  Active Member of Clubs or Organizations: Not on file  . Attends Archivist Meetings: Not on file  . Marital Status: Not on file  Intimate Partner Violence:   . Fear of Current or Ex-Partner: Not on file  . Emotionally Abused: Not on file  . Physically Abused: Not on file  . Sexually Abused: Not on file    Family History  Problem Relation Age of Onset  . Colon cancer Maternal Uncle      Current Outpatient Medications:  .  FLUoxetine (PROZAC) 10 MG capsule, Take 1 capsule (10 mg total) by mouth daily., Disp:  30 capsule, Rfl: 1 .  gabapentin (NEURONTIN) 300 MG capsule, Take 1 capsule (300 mg total) by mouth 2 (two) times daily., Disp: 60 capsule, Rfl: 1 .  lidocaine-prilocaine (EMLA) cream, Apply 1 application topically as needed. Apply small amount to port site at least 1 hour prior to it being accessed, cover with plastic wrap, Disp: 30 g, Rfl: 1 .  oxyCODONE (OXYCONTIN) 10 mg 12 hr tablet, Take 1 tablet (10 mg total) by mouth every 12 (twelve) hours., Disp: 60 tablet, Rfl: 0 .  traZODone (DESYREL) 100 MG tablet, Take 1 tablet (100 mg total) by mouth at bedtime., Disp: 30 tablet, Rfl: 2 .  clindamycin (CLINDAGEL) 1 % gel, Apply topically 2 (two) times daily. X 7 days. (Patient not taking: Reported on 01/02/2020), Disp: 30 g, Rfl: 0 .  diphenoxylate-atropine (LOMOTIL) 2.5-0.025 MG tablet, Take 1 tablet by mouth 4 (four) times daily as needed for diarrhea or loose stools. (Patient not taking: Reported on 01/02/2020), Disp: 30 tablet, Rfl: 0 .  LORazepam (ATIVAN) 0.5 MG tablet, Take 1 tablet (0.5 mg total) by mouth every 8 (eight) hours. (Patient not taking: Reported on 01/02/2020), Disp: 30 tablet, Rfl: 0  Physical exam:  Vitals:   01/16/20 0920  BP: 108/68  Pulse: 77  Resp: 16  Temp: (!) 95.5 F (35.3 C)  TempSrc: Tympanic  SpO2: 99%  Weight: 130 lb 4.8 oz (59.1 kg)   Physical Exam Constitutional:      General: She is not in acute distress. HENT:     Head: Normocephalic and atraumatic.  Eyes:     Pupils: Pupils are equal, round, and reactive to light.  Cardiovascular:     Rate and Rhythm: Normal rate and regular rhythm.     Heart sounds: Normal heart sounds.  Pulmonary:     Effort: Pulmonary effort is normal.     Breath sounds: Normal breath sounds.  Abdominal:     General: Bowel sounds are normal.     Palpations: Abdomen is soft.  Musculoskeletal:     Cervical back: Normal range of motion.  Skin:    General: Skin is warm and dry.  Neurological:     Mental Status: She is alert  and oriented to person, place, and time.      CMP Latest Ref Rng & Units 01/16/2020  Glucose 70 - 99 mg/dL 96  BUN 6 - 20 mg/dL 12  Creatinine 0.44 - 1.00 mg/dL 0.53  Sodium 135 - 145 mmol/L 135  Potassium 3.5 - 5.1 mmol/L 3.9  Chloride 98 - 111 mmol/L 104  CO2 22 - 32 mmol/L 25  Calcium 8.9 - 10.3 mg/dL 9.0  Total Protein 6.5 - 8.1 g/dL 6.5  Total Bilirubin 0.3 - 1.2 mg/dL 0.5  Alkaline Phos 38 - 126 U/L 59  AST 15 - 41 U/L 36  ALT 0 - 44 U/L 36  CBC Latest Ref Rng & Units 01/16/2020  WBC 4.0 - 10.5 K/uL 5.4  Hemoglobin 12.0 - 15.0 g/dL 10.8(L)  Hematocrit 36 - 46 % 33.1(L)  Platelets 150 - 400 K/uL 351    Assessment and plan- Patient is a 39 y.o. female with metastatic ER negative her 2 positive breast cancer on taxol/herceptin and perjeta. She is here for on treatment assessment prior to cycle 7-day 8 of weekly Taxol  Counts okay to proceed with weekly Taxol today.  She will receive cycle 7-day 15 of Taxol next week and I will see her back in 2 weeks for cycle 8-day 1 of Taxol Herceptin and Perjeta.  She will also get Xgeva on that day.  Neoplasm related pain: Currently stable on as needed oxycodone  Anxiety/depression on as needed Ativan and Prozac  Insomnia: Currently on trazodone Visit Diagnosis 1. Metastatic breast cancer ([place])   2. Encounter for antineoplastic chemotherapy   3. Anxiety and depression   4. Neoplasm related pain      Dr. Randa Evens, MD, MPH [facility] [phone] 01/18/2020 4:57 PM

## 2020-01-23 ENCOUNTER — Inpatient Hospital Stay: Payer: Self-pay | Attending: Hospice and Palliative Medicine | Admitting: Hospice and Palliative Medicine

## 2020-01-23 ENCOUNTER — Inpatient Hospital Stay: Payer: Self-pay

## 2020-01-23 ENCOUNTER — Other Ambulatory Visit: Payer: Self-pay

## 2020-01-23 VITALS — BP 106/68 | HR 77 | Temp 98.3°F | Resp 18 | Wt 130.1 lb

## 2020-01-23 DIAGNOSIS — C50919 Malignant neoplasm of unspecified site of unspecified female breast: Secondary | ICD-10-CM

## 2020-01-23 DIAGNOSIS — Z5111 Encounter for antineoplastic chemotherapy: Secondary | ICD-10-CM | POA: Insufficient documentation

## 2020-01-23 DIAGNOSIS — R5383 Other fatigue: Secondary | ICD-10-CM | POA: Insufficient documentation

## 2020-01-23 DIAGNOSIS — Z79899 Other long term (current) drug therapy: Secondary | ICD-10-CM | POA: Insufficient documentation

## 2020-01-23 DIAGNOSIS — M8440XA Pathological fracture, unspecified site, initial encounter for fracture: Secondary | ICD-10-CM

## 2020-01-23 DIAGNOSIS — Z515 Encounter for palliative care: Secondary | ICD-10-CM | POA: Insufficient documentation

## 2020-01-23 DIAGNOSIS — Z5112 Encounter for antineoplastic immunotherapy: Secondary | ICD-10-CM | POA: Insufficient documentation

## 2020-01-23 DIAGNOSIS — Z171 Estrogen receptor negative status [ER-]: Secondary | ICD-10-CM | POA: Insufficient documentation

## 2020-01-23 DIAGNOSIS — G893 Neoplasm related pain (acute) (chronic): Secondary | ICD-10-CM | POA: Insufficient documentation

## 2020-01-23 DIAGNOSIS — C50912 Malignant neoplasm of unspecified site of left female breast: Secondary | ICD-10-CM | POA: Insufficient documentation

## 2020-01-23 DIAGNOSIS — C7951 Secondary malignant neoplasm of bone: Secondary | ICD-10-CM | POA: Insufficient documentation

## 2020-01-23 DIAGNOSIS — R5381 Other malaise: Secondary | ICD-10-CM | POA: Insufficient documentation

## 2020-01-23 LAB — COMPREHENSIVE METABOLIC PANEL
ALT: 27 U/L (ref 0–44)
AST: 29 U/L (ref 15–41)
Albumin: 3.5 g/dL (ref 3.5–5.0)
Alkaline Phosphatase: 53 U/L (ref 38–126)
Anion gap: 8 (ref 5–15)
BUN: 9 mg/dL (ref 6–20)
CO2: 24 mmol/L (ref 22–32)
Calcium: 8.5 mg/dL — ABNORMAL LOW (ref 8.9–10.3)
Chloride: 105 mmol/L (ref 98–111)
Creatinine, Ser: 0.54 mg/dL (ref 0.44–1.00)
GFR, Estimated: >60 mL/min (ref >60)
Glucose, Bld: 108 mg/dL — ABNORMAL HIGH (ref 70–99)
Potassium: 3.5 mmol/L (ref 3.5–5.1)
Sodium: 137 mmol/L (ref 135–145)
Total Bilirubin: 0.5 mg/dL (ref 0.3–1.2)
Total Protein: 6.6 g/dL (ref 6.5–8.1)

## 2020-01-23 LAB — CBC WITH DIFFERENTIAL/PLATELET
Abs Immature Granulocytes: 0.05 10*3/uL (ref 0.00–0.07)
Basophils Absolute: 0 10*3/uL (ref 0.0–0.1)
Basophils Relative: 1 %
Eosinophils Absolute: 0.1 10*3/uL (ref 0.0–0.5)
Eosinophils Relative: 3 %
HCT: 33.1 % — ABNORMAL LOW (ref 36.0–46.0)
Hemoglobin: 10.9 g/dL — ABNORMAL LOW (ref 12.0–15.0)
Immature Granulocytes: 1 %
Lymphocytes Relative: 23 %
Lymphs Abs: 1.1 10*3/uL (ref 0.7–4.0)
MCH: 28.2 pg (ref 26.0–34.0)
MCHC: 32.9 g/dL (ref 30.0–36.0)
MCV: 85.8 fL (ref 80.0–100.0)
Monocytes Absolute: 0.3 10*3/uL (ref 0.1–1.0)
Monocytes Relative: 7 %
Neutro Abs: 3.1 10*3/uL (ref 1.7–7.7)
Neutrophils Relative %: 65 %
Platelets: 353 10*3/uL (ref 150–400)
RBC: 3.86 MIL/uL — ABNORMAL LOW (ref 3.87–5.11)
RDW: 16.3 % — ABNORMAL HIGH (ref 11.5–15.5)
WBC: 4.7 10*3/uL (ref 4.0–10.5)
nRBC: 0 % (ref 0.0–0.2)

## 2020-01-23 MED ORDER — FAMOTIDINE IN NACL 20-0.9 MG/50ML-% IV SOLN
20.0000 mg | Freq: Once | INTRAVENOUS | Status: AC
  Administered 2020-01-23: 20 mg via INTRAVENOUS
  Filled 2020-01-23: qty 50

## 2020-01-23 MED ORDER — SODIUM CHLORIDE 0.9 % IV SOLN
20.0000 mg | Freq: Once | INTRAVENOUS | Status: AC
  Administered 2020-01-23: 20 mg via INTRAVENOUS
  Filled 2020-01-23: qty 20

## 2020-01-23 MED ORDER — SODIUM CHLORIDE 0.9 % IV SOLN
Freq: Once | INTRAVENOUS | Status: AC
  Filled 2020-01-23: qty 250

## 2020-01-23 MED ORDER — HEPARIN SOD (PORK) LOCK FLUSH 100 UNIT/ML IV SOLN
INTRAVENOUS | Status: AC
  Filled 2020-01-23: qty 5

## 2020-01-23 MED ORDER — HEPARIN SOD (PORK) LOCK FLUSH 100 UNIT/ML IV SOLN
500.0000 [IU] | Freq: Once | INTRAVENOUS | Status: AC | PRN
  Administered 2020-01-23: 500 [IU]
  Filled 2020-01-23: qty 5

## 2020-01-23 MED ORDER — SODIUM CHLORIDE 0.9 % IV SOLN
65.0000 mg/m2 | Freq: Once | INTRAVENOUS | Status: AC
  Administered 2020-01-23: 108 mg via INTRAVENOUS
  Filled 2020-01-23: qty 18

## 2020-01-23 MED ORDER — DIPHENHYDRAMINE HCL 50 MG/ML IJ SOLN
50.0000 mg | Freq: Once | INTRAMUSCULAR | Status: AC
  Administered 2020-01-23: 50 mg via INTRAVENOUS
  Filled 2020-01-23: qty 1

## 2020-01-23 NOTE — Progress Notes (Signed)
[place]  Telephone:([phone] [phone]   Name: Sophia [name] Date: 01/23/2020 MRN: [id number]  DOB: [birth year]  Patient Care Team: [facility], [place], NP as PCP - General Rico Junker, RN as Registered Nurse Theodore Demark, RN as Registered Nurse    REASON FOR CONSULTATION: Sophia [name] is a 39 y.o. 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  Patient has history of pathologic fracture to spine status post kyphoplasty.  She also underwent prophylactic fixation of the right hip due to impending fracture.  She has had significant depression and anxiety.  She was referred to palliative care to help address goals and manage ongoing symptoms.  SOCIAL HISTORY:     reports that she has never smoked. She has never used smokeless tobacco. She reports previous alcohol use. She reports previous drug use.  Patient is from [place].  She speaks [place].  She lives here with her 3 children ages 62-15.  She has no family or friend/social support locally.  She is unable to work.  ADVANCE DIRECTIVES:  Does not have  CODE STATUS:   PAST MEDICAL HISTORY: Past Medical History:  Diagnosis Date   Anxiety    Breast cancer ([place])    with mets   Cancer ([place])    Colitis    Vertigo     PAST SURGICAL HISTORY:  Past Surgical History:  Procedure Laterality Date   BREAST BIOPSY Left 08/14/2019   [place] bx of mass, coil marker, path pending   BREAST BIOPSY Left 08/14/2019   [place] bx of LN, hydromarker, path pending   BREAST BIOPSY Left 08/14/2019   affirm bx of calcs, x marker, path pending   ESOPHAGOGASTRODUODENOSCOPY (EGD) WITH PROPOFOL N/A 10/05/2019   Procedure: ESOPHAGOGASTRODUODENOSCOPY (EGD) WITH PROPOFOL;  Surgeon: Lin Landsman, MD;  Location: [place];  Service: Gastroenterology;  Laterality: N/A;    FLEXIBLE SIGMOIDOSCOPY N/A 10/05/2019   Procedure: FLEXIBLE SIGMOIDOSCOPY;  Surgeon: Lin Landsman, MD;  Location: [facility] ENDOSCOPY;  Service: Gastroenterology;  Laterality: N/A;   INTRAMEDULLARY (IM) NAIL INTERTROCHANTERIC Right 09/01/2019   Procedure: INTRAMEDULLARY (IM) NAIL INTERTROCHANTRIC AND RADIOFREQUENCY ABLATION;  Surgeon: Hessie Knows, MD;  Location: [facility] ORS;  Service: Orthopedics;  Laterality: Right;   KYPHOPLASTY N/A 08/29/2019   Procedure: KYPHOPLASTY T6, L1,L4 ,  RADIOFREQUENCY ABLATION;  Surgeon: Hessie Knows, MD;  Location: [facility] ORS;  Service: Orthopedics;  Laterality: N/A;   KYPHOPLASTY Right 09/01/2019   Procedure: Right Sacral Radiofrequency Ablation and Cement Augmentation, Right sacrum and iliac crest;  Surgeon: Hessie Knows, MD;  Location: [facility] ORS;  Service: Orthopedics;  Laterality: Right;   PORTA CATH INSERTION N/A 08/28/2019   Procedure: PORTA CATH INSERTION;  Surgeon: Algernon Huxley, MD;  Location: [place] CV LAB;  Service: Cardiovascular;  Laterality: N/A;    HEMATOLOGY/ONCOLOGY HISTORY:  Oncology History  Metastatic breast cancer ([place])  08/29/2019 Initial Diagnosis   Metastatic breast cancer ([place])   09/04/2019 -  Chemotherapy   The patient had trastuzumab (HERCEPTIN) 450 mg in sodium chloride 0.9 % 250 mL chemo infusion, 483 mg (100 % of original dose 8 mg/kg), Intravenous,  Once, 7 of 8 cycles Dose modification: 8 mg/kg (original dose 8 mg/kg, Cycle 1, Reason: Other (see comments), Comment: loading dose), 6 mg/kg (original dose 6 mg/kg, Cycle 2, Reason: Other (see comments), Comment: per md) Administration: 450 mg (09/04/2019), 357 mg (09/26/2019), 357 mg (10/17/2019), 357 mg (  11/28/2019), 357 mg (12/19/2019), 357 mg (11/07/2019), 357 mg (01/09/2020) PACLitaxel (TAXOL) 132 mg in sodium chloride 0.9 % 250 mL chemo infusion (</= 16m/m2), 80 mg/m2 = 132 mg, Intravenous,  Once, 7 of 8 cycles Dose modification: 65 mg/m2 (original dose 80 mg/m2, Cycle 3, Reason:  Dose not tolerated) Administration: 132 mg (09/11/2019), 132 mg (09/26/2019), 108 mg (10/17/2019), 108 mg (10/24/2019), 108 mg (11/28/2019), 108 mg (12/05/2019), 108 mg (12/19/2019), 108 mg (12/26/2019), 132 mg (09/19/2019), 108 mg (11/07/2019), 108 mg (10/31/2019), 108 mg (11/14/2019), 108 mg (11/21/2019), 108 mg (12/12/2019), 108 mg (01/02/2020), 108 mg (01/09/2020), 108 mg (01/16/2020), 108 mg (01/23/2020) pertuzumab (PERJETA) 840 mg in sodium chloride 0.9 % 250 mL chemo infusion, 840 mg, Intravenous, Once, 7 of 8 cycles Administration: 840 mg (09/04/2019), 420 mg (09/26/2019), 420 mg (10/17/2019), 420 mg (11/28/2019), 420 mg (12/19/2019), 420 mg (11/07/2019), 420 mg (01/09/2020) trastuzumab-dkst (OGIVRI) 483 mg in sodium chloride 0.9 % 250 mL chemo infusion, 8 mg/kg = 483 mg, Intravenous,  Once, 1 of 1 cycle  for chemotherapy treatment.      ALLERGIES:  has No Known Allergies.  MEDICATIONS:  Current Outpatient Medications  Medication Sig Dispense Refill   clindamycin (CLINDAGEL) 1 % gel Apply topically 2 (two) times daily. X 7 days. (Patient not taking: Reported on 01/02/2020) 30 g 0   diphenoxylate-atropine (LOMOTIL) 2.5-0.025 MG tablet Take 1 tablet by mouth 4 (four) times daily as needed for diarrhea or loose stools. (Patient not taking: Reported on 01/02/2020) 30 tablet 0   FLUoxetine (PROZAC) 10 MG capsule Take 1 capsule (10 mg total) by mouth daily. 30 capsule 1   gabapentin (NEURONTIN) 300 MG capsule Take 1 capsule (300 mg total) by mouth 2 (two) times daily. 60 capsule 1   lidocaine-prilocaine (EMLA) cream Apply 1 application topically as needed. Apply small amount to port site at least 1 hour prior to it being accessed, cover with plastic wrap 30 g 1   LORazepam (ATIVAN) 0.5 MG tablet Take 1 tablet (0.5 mg total) by mouth every 8 (eight) hours. (Patient not taking: Reported on 01/02/2020) 30 tablet 0   oxyCODONE (OXYCONTIN) 10 mg 12 hr tablet Take 1 tablet (10 mg total) by mouth every 12 (twelve)  hours. 60 tablet 0   traZODone (DESYREL) 100 MG tablet Take 1 tablet (100 mg total) by mouth at bedtime. 30 tablet 2   No current facility-administered medications for this visit.   Facility-Administered Medications Ordered in Other Visits  Medication Dose Route Frequency Provider Last Rate Last Admin   heparin lock flush 100 unit/mL  500 Units Intracatheter Once PRN RSindy Guadeloupe MD       PACLitaxel (TAXOL) 108 mg in sodium chloride 0.9 % 250 mL chemo infusion (</= 836mm2)  65 mg/m2 (Treatment Plan Recorded) Intravenous Once RaSindy GuadeloupeMD 268 mL/hr at 01/23/20 1056 108 mg at 01/23/20 1056    VITAL SIGNS: There were no vitals taken for this visit. There were no vitals filed for this visit.  Estimated body mass index is 25.41 kg/m as calculated from the following:   Height as of 12/19/19: 5' (1.524 m).   Weight as of an earlier encounter on 01/23/20: 130 lb 2 oz (59 kg).  LABS: CBC:    Component Value Date/Time   WBC 4.7 01/23/2020 0904   HGB 10.9 (L) 01/23/2020 0904   HCT 33.1 (L) 01/23/2020 0904   PLT 353 01/23/2020 0904   MCV 85.8 01/23/2020 0904   NEUTROABS 3.1 01/23/2020 0904  LYMPHSABS 1.1 01/23/2020 0904   MONOABS 0.3 01/23/2020 0904   EOSABS 0.1 01/23/2020 0904   BASOSABS 0.0 01/23/2020 0904   Comprehensive Metabolic Panel:    Component Value Date/Time   NA 137 01/23/2020 0904   K 3.5 01/23/2020 0904   CL 105 01/23/2020 0904   CO2 24 01/23/2020 0904   BUN 9 01/23/2020 0904   CREATININE 0.54 01/23/2020 0904   GLUCOSE 108 (H) 01/23/2020 0904   CALCIUM 8.5 (L) 01/23/2020 0904   AST 29 01/23/2020 0904   ALT 27 01/23/2020 0904   ALKPHOS 53 01/23/2020 0904   BILITOT 0.5 01/23/2020 0904   PROT 6.6 01/23/2020 0904   ALBUMIN 3.5 01/23/2020 0904    RADIOGRAPHIC STUDIES: No results found.  PERFORMANCE STATUS (ECOG) : 1 - Symptomatic but completely ambulatory  Review of Systems Unless otherwise noted, a complete review of systems is  negative.  Physical Exam General: NAD Pulmonary: Unlabored Extremities: no edema, no joint deformities Skin: no rashes Neurological: Weakness but otherwise nonfocal  IMPRESSION: I met with patient in the infusion area with assistance of an interpreter.  Patient reports doing reasonably well.  She denies any significant changes or concerns today.  She continues to endorse periods of crying and depression. However, she feels her moods are overall improved on Prozac.  I would generally recommend dose increase of Prozac to 20 mg daily.  However, it appears that she was not previously taking this when it was started in September and has only been taking it about 2 weeks.  Would recommend monitoring for effect and increasing dose as needed.  I suggested that she reach out to the counselor at [facility] [employer].  We discussed nutritional supplements.  She says the milk-based supplements have upset her stomach.  I suggested Ensure Clear and provided her with some samples today.  PLAN: -Continue current scope of treatment -Continue Prozac 10 mg daily. Recommend increasing to 51m daily in 2-3 weeks if needed -Continue trazodone and gabapentin -Continue lorazepam -Follow-up MyChart visit in 2-3 months  Patient expressed understanding and was in agreement with this plan. She also understands that She can call the clinic at any time with any questions, concerns, or complaints.     Time Total: 20 minutes  Visit consisted of counseling and education dealing with the complex and emotionally intense issues of symptom management and palliative care in the setting of serious and potentially life-threatening illness.Greater than 50%  of this time was spent counseling and coordinating care related to the above assessment and plan.  Signed by: JAltha Harm PhD, NP-C

## 2020-01-24 LAB — CANCER ANTIGEN 27.29: CA 27.29: 11.3 U/mL (ref 0.0–38.6)

## 2020-01-30 ENCOUNTER — Encounter: Payer: Self-pay | Admitting: Oncology

## 2020-01-30 ENCOUNTER — Other Ambulatory Visit: Payer: Self-pay

## 2020-01-30 ENCOUNTER — Inpatient Hospital Stay: Payer: Self-pay

## 2020-01-30 ENCOUNTER — Inpatient Hospital Stay (HOSPITAL_BASED_OUTPATIENT_CLINIC_OR_DEPARTMENT_OTHER): Payer: Self-pay | Admitting: Oncology

## 2020-01-30 VITALS — BP 106/72 | HR 74 | Temp 98.6°F | Resp 16 | Wt 129.8 lb

## 2020-01-30 DIAGNOSIS — M8440XA Pathological fracture, unspecified site, initial encounter for fracture: Secondary | ICD-10-CM

## 2020-01-30 DIAGNOSIS — C7951 Secondary malignant neoplasm of bone: Secondary | ICD-10-CM

## 2020-01-30 DIAGNOSIS — G893 Neoplasm related pain (acute) (chronic): Secondary | ICD-10-CM

## 2020-01-30 DIAGNOSIS — C50919 Malignant neoplasm of unspecified site of unspecified female breast: Secondary | ICD-10-CM

## 2020-01-30 DIAGNOSIS — Z5111 Encounter for antineoplastic chemotherapy: Secondary | ICD-10-CM

## 2020-01-30 LAB — CBC WITH DIFFERENTIAL/PLATELET
Abs Immature Granulocytes: 0.05 10*3/uL (ref 0.00–0.07)
Basophils Absolute: 0 10*3/uL (ref 0.0–0.1)
Basophils Relative: 1 %
Eosinophils Absolute: 0.1 10*3/uL (ref 0.0–0.5)
Eosinophils Relative: 2 %
HCT: 33.4 % — ABNORMAL LOW (ref 36.0–46.0)
Hemoglobin: 11 g/dL — ABNORMAL LOW (ref 12.0–15.0)
Immature Granulocytes: 1 %
Lymphocytes Relative: 20 %
Lymphs Abs: 0.9 10*3/uL (ref 0.7–4.0)
MCH: 28.5 pg (ref 26.0–34.0)
MCHC: 32.9 g/dL (ref 30.0–36.0)
MCV: 86.5 fL (ref 80.0–100.0)
Monocytes Absolute: 0.3 10*3/uL (ref 0.1–1.0)
Monocytes Relative: 7 %
Neutro Abs: 3.4 10*3/uL (ref 1.7–7.7)
Neutrophils Relative %: 69 %
Platelets: 352 10*3/uL (ref 150–400)
RBC: 3.86 MIL/uL — ABNORMAL LOW (ref 3.87–5.11)
RDW: 16.4 % — ABNORMAL HIGH (ref 11.5–15.5)
WBC: 4.8 10*3/uL (ref 4.0–10.5)
nRBC: 0 % (ref 0.0–0.2)

## 2020-01-30 LAB — COMPREHENSIVE METABOLIC PANEL
ALT: 23 U/L (ref 0–44)
AST: 30 U/L (ref 15–41)
Albumin: 3.6 g/dL (ref 3.5–5.0)
Alkaline Phosphatase: 48 U/L (ref 38–126)
Anion gap: 7 (ref 5–15)
BUN: 9 mg/dL (ref 6–20)
CO2: 23 mmol/L (ref 22–32)
Calcium: 8.5 mg/dL — ABNORMAL LOW (ref 8.9–10.3)
Chloride: 107 mmol/L (ref 98–111)
Creatinine, Ser: 0.43 mg/dL — ABNORMAL LOW (ref 0.44–1.00)
GFR, Estimated: >60 mL/min (ref >60)
Glucose, Bld: 117 mg/dL — ABNORMAL HIGH (ref 70–99)
Potassium: 3.5 mmol/L (ref 3.5–5.1)
Sodium: 137 mmol/L (ref 135–145)
Total Bilirubin: 0.5 mg/dL (ref 0.3–1.2)
Total Protein: 6.5 g/dL (ref 6.5–8.1)

## 2020-01-30 MED ORDER — SODIUM CHLORIDE 0.9 % IV SOLN
65.0000 mg/m2 | Freq: Once | INTRAVENOUS | Status: AC
  Administered 2020-01-30: 108 mg via INTRAVENOUS
  Filled 2020-01-30: qty 18

## 2020-01-30 MED ORDER — TRASTUZUMAB-ANNS CHEMO 150 MG IV SOLR
6.0000 mg/kg | Freq: Once | INTRAVENOUS | Status: AC
  Administered 2020-01-30: 357 mg via INTRAVENOUS
  Filled 2020-01-30: qty 17

## 2020-01-30 MED ORDER — HEPARIN SOD (PORK) LOCK FLUSH 100 UNIT/ML IV SOLN
500.0000 [IU] | Freq: Once | INTRAVENOUS | Status: AC | PRN
  Administered 2020-01-30: 500 [IU]
  Filled 2020-01-30: qty 5

## 2020-01-30 MED ORDER — SODIUM CHLORIDE 0.9 % IV SOLN
Freq: Once | INTRAVENOUS | Status: AC
  Filled 2020-01-30: qty 250

## 2020-01-30 MED ORDER — HEPARIN SOD (PORK) LOCK FLUSH 100 UNIT/ML IV SOLN
INTRAVENOUS | Status: AC
  Filled 2020-01-30: qty 5

## 2020-01-30 MED ORDER — TRASTUZUMAB CHEMO 150 MG IV SOLR: 6.0000 mg/kg | Freq: Once | INTRAVENOUS | Status: DC

## 2020-01-30 MED ORDER — OXYCODONE HCL ER 10 MG PO T12A
10.0000 mg | EXTENDED_RELEASE_TABLET | Freq: Two times a day (BID) | ORAL | 0 refills | Status: DC
Start: 2020-01-30 — End: 2020-03-05

## 2020-01-30 MED ORDER — SODIUM CHLORIDE 0.9 % IV SOLN
20.0000 mg | Freq: Once | INTRAVENOUS | Status: AC
  Administered 2020-01-30: 20 mg via INTRAVENOUS
  Filled 2020-01-30: qty 20

## 2020-01-30 MED ORDER — DENOSUMAB 120 MG/1.7ML ~~LOC~~ SOLN
120.0000 mg | Freq: Once | SUBCUTANEOUS | Status: AC
  Administered 2020-01-30: 120 mg via SUBCUTANEOUS
  Filled 2020-01-30: qty 1.7

## 2020-01-30 MED ORDER — FAMOTIDINE IN NACL 20-0.9 MG/50ML-% IV SOLN
20.0000 mg | Freq: Once | INTRAVENOUS | Status: AC
  Administered 2020-01-30: 20 mg via INTRAVENOUS
  Filled 2020-01-30: qty 50

## 2020-01-30 MED ORDER — SODIUM CHLORIDE 0.9 % IV SOLN
420.0000 mg | Freq: Once | INTRAVENOUS | Status: AC
  Administered 2020-01-30: 420 mg via INTRAVENOUS
  Filled 2020-01-30: qty 14

## 2020-01-30 MED ORDER — DIPHENHYDRAMINE HCL 50 MG/ML IJ SOLN
50.0000 mg | Freq: Once | INTRAMUSCULAR | Status: AC
  Administered 2020-01-30: 50 mg via INTRAVENOUS
  Filled 2020-01-30: qty 1

## 2020-01-30 MED ORDER — ACETAMINOPHEN 325 MG PO TABS
650.0000 mg | ORAL_TABLET | Freq: Once | ORAL | Status: AC
  Administered 2020-01-30: 650 mg via ORAL
  Filled 2020-01-30: qty 2

## 2020-01-30 NOTE — Progress Notes (Signed)
Ca =8.5 ok to give xgeva per MD

## 2020-02-01 NOTE — Progress Notes (Signed)
Hematology/Oncology Consult note [facility]  Telephone:([phone] [phone]  Patient Care Team: [place], [place], NP as PCP - General Rico Junker, RN as Registered Nurse Theodore Demark, RN as Registered Nurse   Name of the patient: Sophia [name]  [id number]  19-Jan-1981   Date of visit: 02/01/20  Diagnosis- stage IV metastatic breast cancer ER/PR negative HER-2/neu positive with bone metastases  Chief complaint/ Reason for visit-on treatment assessment prior to cycle 8 day 1 of Taxol Herceptin and Perjeta  Heme/Onc history: patient is a 39 year old Hispanic female who is here with her friend. History obtained with the help of an interpreter.Patient self palpated left breast mass which was followed by a diagnostic bilateral mammogram. Mammogram showed 3.1 x 2.9 x 1.9 cm hypoechoic mass at the 1 o'clock position of the left breast. For abnormal cortically thickened left axillary lymph nodes measuring up to 5 mm. 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 Patient was also having ongoing back pain and was seen by [facility] orthopedics Dr. Doyle Askew who ordered MRI lumbar spine without contrast which showed possible pathologic fractures of L1 and L4 vertebral bodies with greater than 50% height loss at L1 and abnormal signal involving L2-L3 S1 as well as right iliac bone concerning for metastatic disease.Patient is a single mother of 3 adult children and is very anxious today.She reports significant back pain which radiates to her bilateral thighs. Denies any focal tingling numbness or weakness. Denies any bowel bladder incontinence. Pain has been uncontrolled despite taking Tylenol. No prior history of abnormal breast biopsies. No family history of breast cancer  PET and MRIshowed 3 areas of pathologic fracture of  her spine as well as widespread bony metastatic disease and concern for impending fracture of the right hip. Given her worsening pain she was asked to come to the ER. She has been evaluated by Dr. Rudene Christians from orthopedic surgery and underwent kyphoplasty at 3 different levels.T6 L1 and L4 along with radiofrequency ablation.She also underwent prophylactic fixation of the right hip and not affected the sacral region.   Patient received first dose of Herceptin and Perjeta on 09/04/2019. Baseline echocardiogram normal.she is currently getting taxol/ herceptin/perjeta  Patient admitted to hospital for acute abdominal pain with CT findings concerning for acute colitis. Colonoscopy showed diffuse severe inflammation with erythema friability and loss of vascularity and shallow ulcerations in the IC valve, ascending colon and cecum.   Interval history-patient has chronic right thigh pain.  She is using trazodone 100 mg at night for insomnia which is helping.Also takes Prozac for depression.  Reports having few days of epigastric discomfort following last chemotherapy which resolved on its own.  She is eating and drinking normally.  Bowel movements have been regular  ECOG PS- 1 Pain scale- 3 Opioid associated constipation- no  Review of systems- Review of Systems  Constitutional: Positive for malaise/fatigue. Negative for chills, fever and weight loss.  HENT: Negative for congestion, ear discharge and nosebleeds.   Eyes: Negative for blurred vision.  Respiratory: Negative for cough, hemoptysis, sputum production, shortness of breath and wheezing.   Cardiovascular: Negative for chest pain, palpitations, orthopnea and claudication.  Gastrointestinal: Negative for abdominal pain, blood in stool, constipation, diarrhea, heartburn, melena, nausea and vomiting.  Genitourinary: Negative for dysuria, flank pain, frequency, hematuria and urgency.  Musculoskeletal: Negative for back pain, joint  pain and  myalgias.       Right thigh pain  Skin: Negative for rash.  Neurological: Negative for dizziness, tingling, focal weakness, seizures, weakness and headaches.  Endo/Heme/Allergies: Does not bruise/bleed easily.  Psychiatric/Behavioral: Negative for depression and suicidal ideas. The patient does not have insomnia.        No Known Allergies   Past Medical History:  Diagnosis Date  . Anxiety   . Breast cancer ([place])    with mets  . Cancer ([place])   . Colitis   . Vertigo      Past Surgical History:  Procedure Laterality Date  . BREAST BIOPSY Left 08/14/2019   [place] bx of mass, coil marker, path pending  . BREAST BIOPSY Left 08/14/2019   [place] bx of LN, hydromarker, path pending  . BREAST BIOPSY Left 08/14/2019   affirm bx of calcs, x marker, path pending  . ESOPHAGOGASTRODUODENOSCOPY (EGD) WITH PROPOFOL N/A 10/05/2019   Procedure: ESOPHAGOGASTRODUODENOSCOPY (EGD) WITH PROPOFOL;  Surgeon: Lin Landsman, MD;  Location: [place];  Service: Gastroenterology;  Laterality: N/A;  . FLEXIBLE SIGMOIDOSCOPY N/A 10/05/2019   Procedure: FLEXIBLE SIGMOIDOSCOPY;  Surgeon: Lin Landsman, MD;  Location: [facility] ENDOSCOPY;  Service: Gastroenterology;  Laterality: N/A;  . INTRAMEDULLARY (IM) NAIL INTERTROCHANTERIC Right 09/01/2019   Procedure: INTRAMEDULLARY (IM) NAIL INTERTROCHANTRIC AND RADIOFREQUENCY ABLATION;  Surgeon: Hessie Knows, MD;  Location: [facility] ORS;  Service: Orthopedics;  Laterality: Right;  . KYPHOPLASTY N/A 08/29/2019   Procedure: KYPHOPLASTY T6, L1,L4 ,  RADIOFREQUENCY ABLATION;  Surgeon: Hessie Knows, MD;  Location: [facility] ORS;  Service: Orthopedics;  Laterality: N/A;  . KYPHOPLASTY Right 09/01/2019   Procedure: Right Sacral Radiofrequency Ablation and Cement Augmentation, Right sacrum and iliac crest;  Surgeon: Hessie Knows, MD;  Location: [facility]S;  Service: Orthopedics;  Laterality: Right;  . PORTA CATH INSERTION N/A 08/28/2019   Procedure: PORTA CATH INSERTION;  Surgeon:  Algernon Huxley, MD;  Location: [place] CV LAB;  Service: Cardiovascular;  Laterality: N/A;    Social History   Socioeconomic History  . Marital status: Single    Spouse name: Not on file  . Number of children: Not on file  . Years of education: Not on file  . Highest education level: Not on file  Occupational History  . Not on file  Tobacco Use  . Smoking status: Never Smoker  . Smokeless tobacco: Never Used  Vaping Use  . Vaping Use: Never used  Substance and Sexual Activity  . Alcohol use: Not Currently  . Drug use: Not Currently  . Sexual activity: Not Currently    Birth control/protection: None  Other Topics Concern  . Not on file  Social History Narrative   Lives at home with children   Social Determinants of Health   Financial Resource Strain:   . Difficulty of Paying Living Expenses: Not on file  Food Insecurity:   . Worried About Charity fundraiser in the Last Year: Not on file  . Ran Out of Food in the Last Year: Not on file  Transportation Needs:   . Lack of Transportation (Medical): Not on file  . Lack of Transportation (Non-Medical): Not on file  Physical Activity:   . Days of Exercise per Week: Not on file  . Minutes of Exercise per Session: Not on file  Stress:   . Feeling of Stress : Not on file  Social Connections:   . Frequency of Communication with Friends and Family: Not on file  . Frequency of  Social Gatherings with Friends and Family: Not on file  . Attends Religious Services: Not on file  . Active Member of Clubs or Organizations: Not on file  . Attends Archivist Meetings: Not on file  . Marital Status: Not on file  Intimate Partner Violence:   . Fear of Current or Ex-Partner: Not on file  . Emotionally Abused: Not on file  . Physically Abused: Not on file  . Sexually Abused: Not on file    Family History  Problem Relation Age of Onset  . Colon cancer Maternal Uncle      Current Outpatient Medications:  .   diphenoxylate-atropine (LOMOTIL) 2.5-0.025 MG tablet, Take 1 tablet by mouth 4 (four) times daily as needed for diarrhea or loose stools., Disp: 30 tablet, Rfl: 0 .  FLUoxetine (PROZAC) 10 MG capsule, Take 1 capsule (10 mg total) by mouth daily., Disp: 30 capsule, Rfl: 1 .  gabapentin (NEURONTIN) 300 MG capsule, Take 1 capsule (300 mg total) by mouth 2 (two) times daily., Disp: 60 capsule, Rfl: 1 .  lidocaine-prilocaine (EMLA) cream, Apply 1 application topically as needed. Apply small amount to port site at least 1 hour prior to it being accessed, cover with plastic wrap, Disp: 30 g, Rfl: 1 .  traZODone (DESYREL) 100 MG tablet, Take 1 tablet (100 mg total) by mouth at bedtime., Disp: 30 tablet, Rfl: 2 .  clindamycin (CLINDAGEL) 1 % gel, Apply topically 2 (two) times daily. X 7 days. (Patient not taking: Reported on 01/02/2020), Disp: 30 g, Rfl: 0 .  LORazepam (ATIVAN) 0.5 MG tablet, Take 1 tablet (0.5 mg total) by mouth every 8 (eight) hours. (Patient not taking: Reported on 01/02/2020), Disp: 30 tablet, Rfl: 0 .  oxyCODONE (OXYCONTIN) 10 mg 12 hr tablet, Take 1 tablet (10 mg total) by mouth every 12 (twelve) hours., Disp: 60 tablet, Rfl: 0  Physical exam:  Vitals:   01/30/20 0947  BP: 106/72  Pulse: 74  Resp: 16  Temp: 98.6 F (37 C)  TempSrc: Tympanic  SpO2: 100%  Weight: 129 lb 12.8 oz (58.9 kg)   Physical Exam Constitutional:      General: She is not in acute distress. Cardiovascular:     Rate and Rhythm: Normal rate and regular rhythm.     Heart sounds: Normal heart sounds.  Pulmonary:     Effort: Pulmonary effort is normal.     Breath sounds: Normal breath sounds.  Abdominal:     General: Bowel sounds are normal.     Palpations: Abdomen is soft.  Skin:    General: Skin is warm and dry.  Neurological:     Mental Status: She is alert and oriented to person, place, and time.      CMP Latest Ref Rng & Units 01/30/2020  Glucose 70 - 99 mg/dL 117(H)  BUN 6 - 20 mg/dL 9    Creatinine 0.44 - 1.00 mg/dL 0.43(L)  Sodium 135 - 145 mmol/L 137  Potassium 3.5 - 5.1 mmol/L 3.5  Chloride 98 - 111 mmol/L 107  CO2 22 - 32 mmol/L 23  Calcium 8.9 - 10.3 mg/dL 8.5(L)  Total Protein 6.5 - 8.1 g/dL 6.5  Total Bilirubin 0.3 - 1.2 mg/dL 0.5  Alkaline Phos 38 - 126 U/L 48  AST 15 - 41 U/L 30  ALT 0 - 44 U/L 23   CBC Latest Ref Rng & Units 01/30/2020  WBC 4.0 - 10.5 K/uL 4.8  Hemoglobin 12.0 - 15.0 g/dL 11.0(L)  Hematocrit 36 -  46 % 33.4(L)  Platelets 150 - 400 K/uL 352      Assessment and plan- Patient is a 39 y.o. female with metastatic ER negative her 2 positive breast cancer on taxol/herceptin and perjeta.   She is here for on treatment assessment prior to cycle 8-day 1 of Taxol Herceptin and Perjeta  Counts okay to proceed with cycle 8 day 1 of Taxol Herceptin and Perjeta today.  She will directly proceed for Taxol in 1 week's time.  2 weeks from now is Thanksgiving and she will not be receiving any chemotherapy on that day.  I will see her back in 3 weeks for cycle 9-day 1 of Taxol Herceptin and Perjeta   Patient is tolerating treatment well without any significant peripheral neuropathy.  She does have intermittent diarrhea which is currently well controlled with as needed Imodium.  Neoplasm related back pain and right leg pain: Continue as needed oxycodone.  Insomnia on trazodone  Anxiety and depression: Continue Prozac and as needed Ativan Visit Diagnosis 1. Metastatic breast cancer ([place])   2. Encounter for antineoplastic chemotherapy   3. Neoplasm related pain      Dr. Randa Evens, MD, MPH [facility] [phone] 02/01/2020 9:26 PM

## 2020-02-06 ENCOUNTER — Other Ambulatory Visit: Payer: Self-pay

## 2020-02-06 ENCOUNTER — Inpatient Hospital Stay: Payer: Self-pay

## 2020-02-06 ENCOUNTER — Other Ambulatory Visit: Payer: Self-pay | Admitting: Oncology

## 2020-02-06 VITALS — BP 101/56 | HR 69 | Temp 98.4°F | Resp 18

## 2020-02-06 DIAGNOSIS — M8440XA Pathological fracture, unspecified site, initial encounter for fracture: Secondary | ICD-10-CM

## 2020-02-06 DIAGNOSIS — C50919 Malignant neoplasm of unspecified site of unspecified female breast: Secondary | ICD-10-CM

## 2020-02-06 LAB — COMPREHENSIVE METABOLIC PANEL
ALT: 24 U/L (ref 0–44)
AST: 26 U/L (ref 15–41)
Albumin: 3.8 g/dL (ref 3.5–5.0)
Alkaline Phosphatase: 50 U/L (ref 38–126)
Anion gap: 10 (ref 5–15)
BUN: 12 mg/dL (ref 6–20)
CO2: 24 mmol/L (ref 22–32)
Calcium: 8.6 mg/dL — ABNORMAL LOW (ref 8.9–10.3)
Chloride: 103 mmol/L (ref 98–111)
Creatinine, Ser: 0.52 mg/dL (ref 0.44–1.00)
GFR, Estimated: >60 mL/min (ref >60)
Glucose, Bld: 70 mg/dL (ref 70–99)
Potassium: 3.9 mmol/L (ref 3.5–5.1)
Sodium: 137 mmol/L (ref 135–145)
Total Bilirubin: 0.7 mg/dL (ref 0.3–1.2)
Total Protein: 6.8 g/dL (ref 6.5–8.1)

## 2020-02-06 LAB — CBC WITH DIFFERENTIAL/PLATELET
Abs Immature Granulocytes: 0.06 10*3/uL (ref 0.00–0.07)
Basophils Absolute: 0 10*3/uL (ref 0.0–0.1)
Basophils Relative: 1 %
Eosinophils Absolute: 0.1 10*3/uL (ref 0.0–0.5)
Eosinophils Relative: 1 %
HCT: 35.1 % — ABNORMAL LOW (ref 36.0–46.0)
Hemoglobin: 11.5 g/dL — ABNORMAL LOW (ref 12.0–15.0)
Immature Granulocytes: 1 %
Lymphocytes Relative: 19 %
Lymphs Abs: 0.9 10*3/uL (ref 0.7–4.0)
MCH: 28.5 pg (ref 26.0–34.0)
MCHC: 32.8 g/dL (ref 30.0–36.0)
MCV: 87.1 fL (ref 80.0–100.0)
Monocytes Absolute: 0.3 10*3/uL (ref 0.1–1.0)
Monocytes Relative: 6 %
Neutro Abs: 3.2 10*3/uL (ref 1.7–7.7)
Neutrophils Relative %: 72 %
Platelets: 349 10*3/uL (ref 150–400)
RBC: 4.03 MIL/uL (ref 3.87–5.11)
RDW: 15.9 % — ABNORMAL HIGH (ref 11.5–15.5)
WBC: 4.5 10*3/uL (ref 4.0–10.5)
nRBC: 0 % (ref 0.0–0.2)

## 2020-02-06 MED ORDER — DIPHENHYDRAMINE HCL 50 MG/ML IJ SOLN
50.0000 mg | Freq: Once | INTRAMUSCULAR | Status: AC
  Administered 2020-02-06: 50 mg via INTRAVENOUS
  Filled 2020-02-06: qty 1

## 2020-02-06 MED ORDER — SODIUM CHLORIDE 0.9 % IV SOLN
Freq: Once | INTRAVENOUS | Status: AC
  Filled 2020-02-06: qty 250

## 2020-02-06 MED ORDER — SODIUM CHLORIDE 0.9 % IV SOLN
20.0000 mg | Freq: Once | INTRAVENOUS | Status: AC
  Administered 2020-02-06: 20 mg via INTRAVENOUS
  Filled 2020-02-06: qty 20

## 2020-02-06 MED ORDER — HEPARIN SOD (PORK) LOCK FLUSH 100 UNIT/ML IV SOLN
INTRAVENOUS | Status: AC
  Filled 2020-02-06: qty 5

## 2020-02-06 MED ORDER — FAMOTIDINE IN NACL 20-0.9 MG/50ML-% IV SOLN
20.0000 mg | Freq: Once | INTRAVENOUS | Status: AC
  Administered 2020-02-06: 20 mg via INTRAVENOUS
  Filled 2020-02-06: qty 50

## 2020-02-06 MED ORDER — HEPARIN SOD (PORK) LOCK FLUSH 100 UNIT/ML IV SOLN
500.0000 [IU] | Freq: Once | INTRAVENOUS | Status: AC | PRN
  Administered 2020-02-06: 500 [IU]
  Filled 2020-02-06: qty 5

## 2020-02-06 MED ORDER — SODIUM CHLORIDE 0.9 % IV SOLN
65.0000 mg/m2 | Freq: Once | INTRAVENOUS | Status: AC
  Administered 2020-02-06: 108 mg via INTRAVENOUS
  Filled 2020-02-06: qty 18

## 2020-02-06 MED ORDER — SODIUM CHLORIDE 0.9% FLUSH
10.0000 mL | Freq: Once | INTRAVENOUS | Status: AC
  Administered 2020-02-06: 10 mL via INTRAVENOUS
  Filled 2020-02-06: qty 10

## 2020-02-06 NOTE — Progress Notes (Signed)
Pt received taxol infusion today per order. Tolerated well. No blood return from port today, per Dr Janese Banks port ok to use for chemo - no s/s of infiltration or resistance while flushing

## 2020-02-10 ENCOUNTER — Inpatient Hospital Stay: Payer: Self-pay

## 2020-02-10 ENCOUNTER — Telehealth: Payer: Self-pay | Admitting: *Deleted

## 2020-02-10 ENCOUNTER — Ambulatory Visit
Admission: RE | Admit: 2020-02-10 | Discharge: 2020-02-10 | Disposition: A | Discharge status: Home / Self Care | Payer: Self-pay | Attending: Nurse Practitioner | Admitting: Nurse Practitioner

## 2020-02-10 ENCOUNTER — Ambulatory Visit
Admission: RE | Admit: 2020-02-10 | Discharge: 2020-02-10 | Disposition: A | Discharge status: Home / Self Care | Payer: Self-pay | Source: Ambulatory Visit | Attending: Nurse Practitioner | Admitting: Nurse Practitioner

## 2020-02-10 ENCOUNTER — Inpatient Hospital Stay (HOSPITAL_BASED_OUTPATIENT_CLINIC_OR_DEPARTMENT_OTHER): Payer: Self-pay | Admitting: Nurse Practitioner

## 2020-02-10 ENCOUNTER — Other Ambulatory Visit: Payer: Self-pay

## 2020-02-10 VITALS — BP 113/84 | HR 73 | Temp 98.5°F | Resp 16 | Ht 60.0 in | Wt 127.0 lb

## 2020-02-10 DIAGNOSIS — M546 Pain in thoracic spine: Secondary | ICD-10-CM

## 2020-02-10 DIAGNOSIS — G893 Neoplasm related pain (acute) (chronic): Secondary | ICD-10-CM

## 2020-02-10 DIAGNOSIS — C7951 Secondary malignant neoplasm of bone: Secondary | ICD-10-CM

## 2020-02-10 DIAGNOSIS — M549 Dorsalgia, unspecified: Secondary | ICD-10-CM

## 2020-02-10 LAB — CANCER ANTIGEN 27.29: CA 27.29: 10.9 U/mL (ref 0.0–38.6)

## 2020-02-10 IMAGING — CR DG THORACIC SPINE 2V
1 series · 4 of 4 positions shown · non-contrast
Comparison: None.

CLINICAL DATA: Increased back pain

EXAM:
THORACIC SPINE 2 VIEWS

[Series 1: dg thoracic spine 2 view · 0.14mm/px · 4 of 4 slices shown]
[im 1/4]
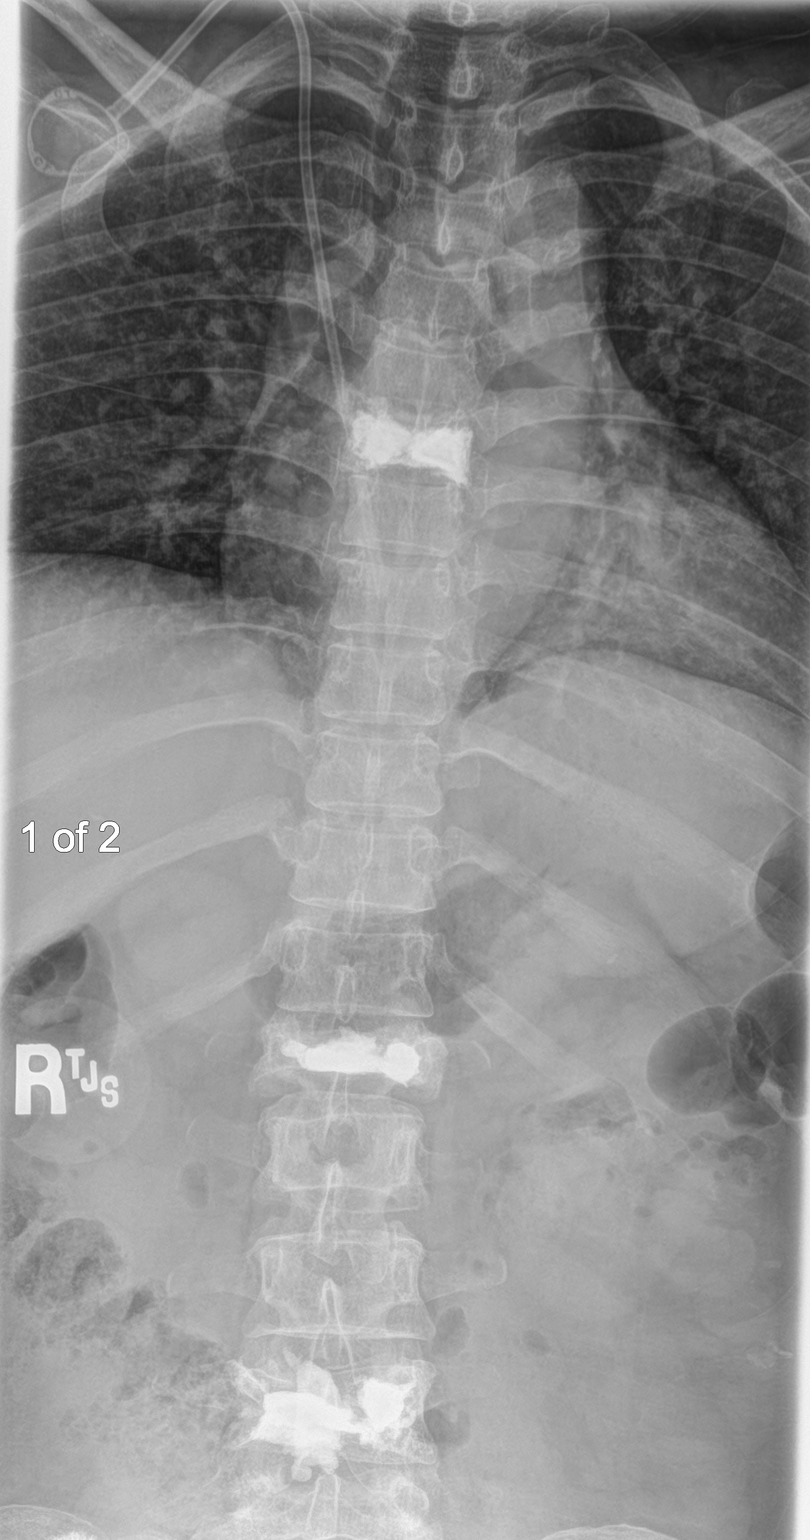
[im 2/4]
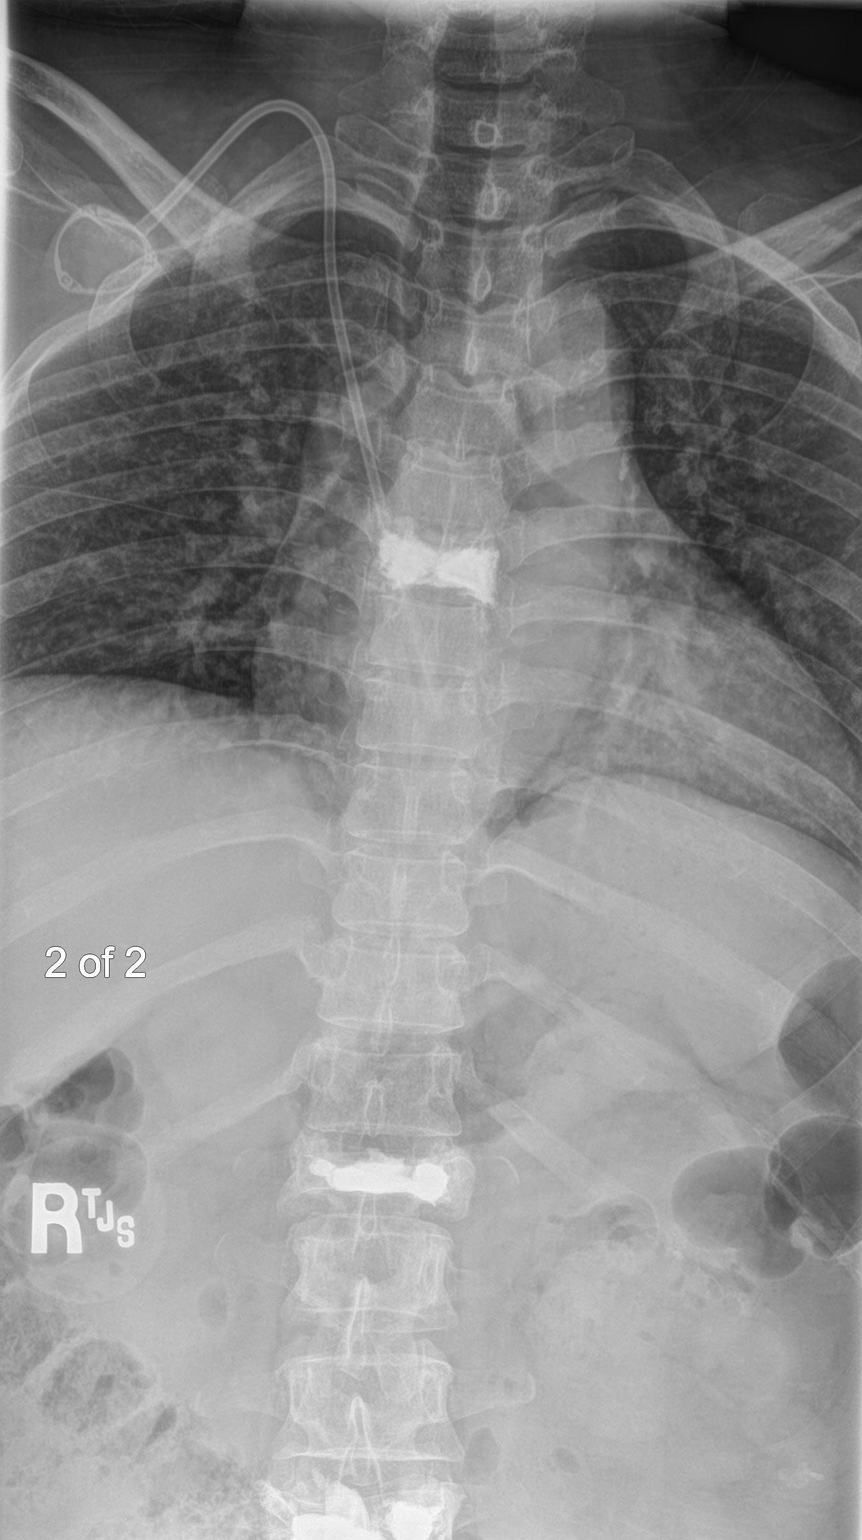
[im 3/4]
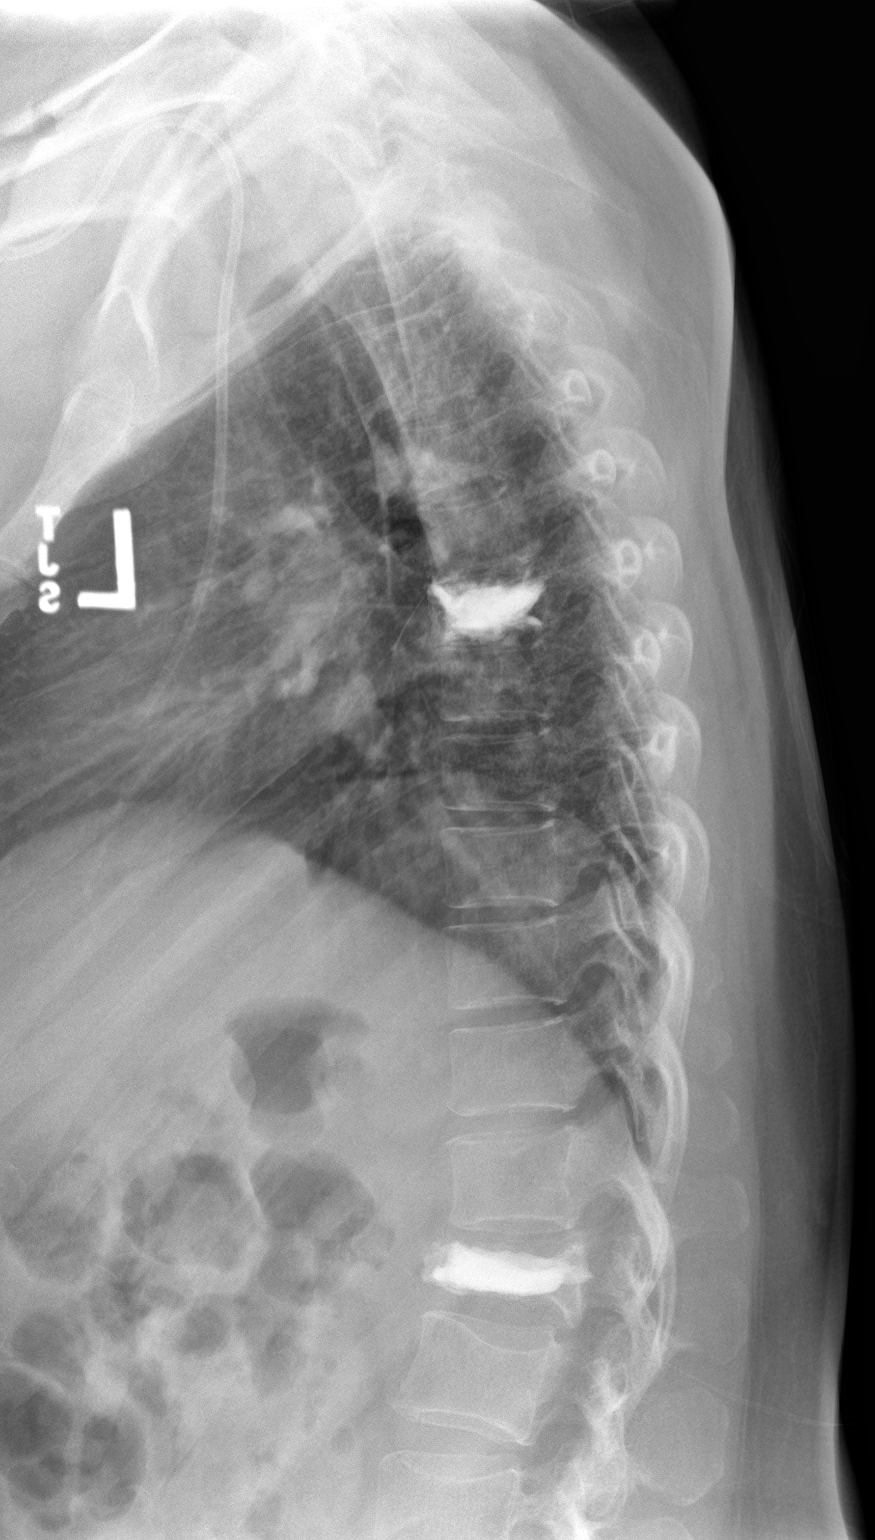
[im 4/4]
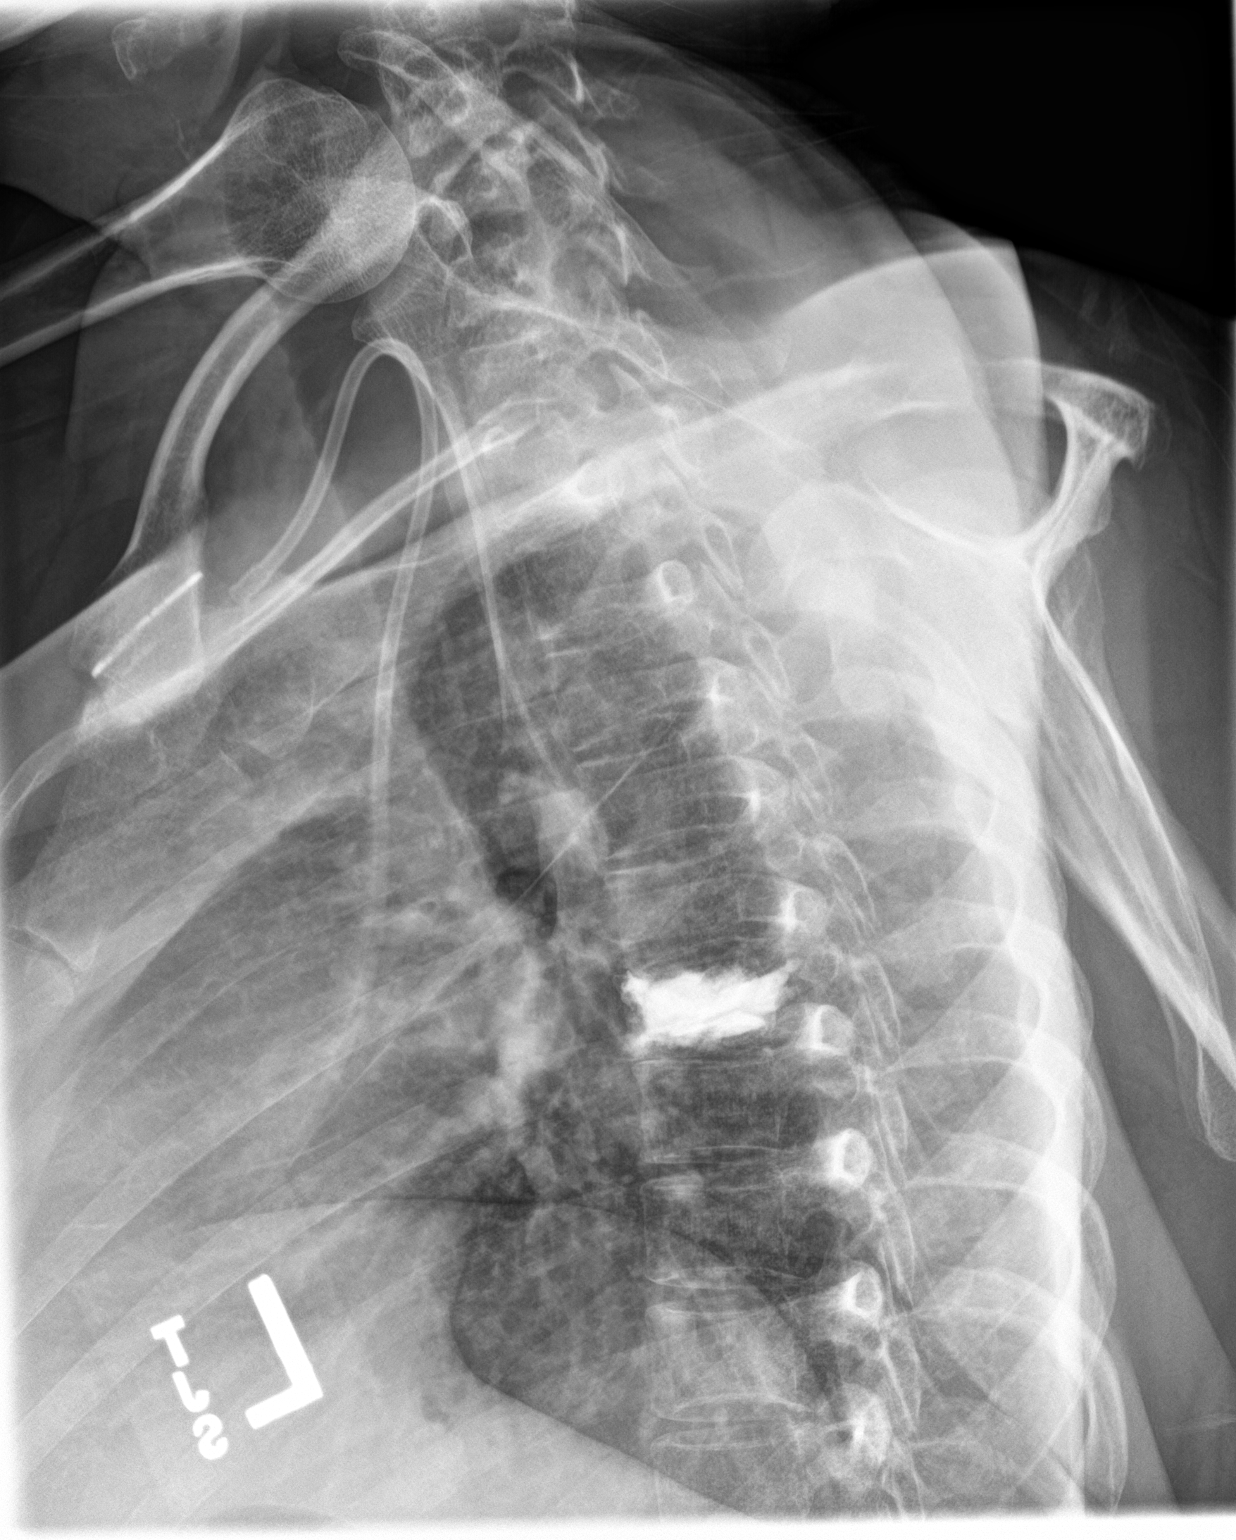

[4 of 4 positions shown; findings below may reference images not displayed]

FINDINGS: Prior vertebroplasty noted at L1, L4 and T6. No acute fracture. No
focal bone lesion or subluxation. Right Port-A-Cath in place with
the tip in the right atrium. Visualized lungs clear.
IMPRESSION: No acute bony abnormality.

## 2020-02-10 MED ORDER — MORPHINE SULFATE (PF) 2 MG/ML IV SOLN
2.0000 mg | Freq: Once | INTRAVENOUS | Status: AC
  Administered 2020-02-10: 2 mg via INTRAMUSCULAR
  Filled 2020-02-10: qty 1

## 2020-02-10 NOTE — Progress Notes (Signed)
Symptom Management [place]  [phone] [phone]  Patient Care Team: Center, [place], NP as PCP - General Rico Junker, RN as Registered Nurse Theodore Demark, RN as Registered Nurse   Name of the patient: Sophia [name]  [id number]  Jan 12, 1981   Date of visit: 02/10/20  Diagnosis-metastatic breast cancer  Chief complaint/ Reason for visit-pain  Heme/Onc history:  Oncology History  Metastatic breast cancer ([place])  08/29/2019 Initial Diagnosis   Metastatic breast cancer ([place])   09/04/2019 -  Chemotherapy   The patient had trastuzumab (HERCEPTIN) 450 mg in sodium chloride 0.9 % 250 mL chemo infusion, 483 mg (100 % of original dose 8 mg/kg), Intravenous,  Once, 8 of 9 cycles Dose modification: 8 mg/kg (original dose 8 mg/kg, Cycle 1, Reason: Other (see comments), Comment: loading dose), 6 mg/kg (original dose 6 mg/kg, Cycle 2, Reason: Other (see comments), Comment: per md) Administration: 450 mg (09/04/2019), 357 mg (09/26/2019), 357 mg (10/17/2019), 357 mg (11/28/2019), 357 mg (12/19/2019), 357 mg (11/07/2019), 357 mg (01/09/2020) PACLitaxel (TAXOL) 132 mg in sodium chloride 0.9 % 250 mL chemo infusion (</= 80mg /m2), 80 mg/m2 = 132 mg, Intravenous,  Once, 8 of 9 cycles Dose modification: 65 mg/m2 (original dose 80 mg/m2, Cycle 3, Reason: Dose not tolerated) Administration: 132 mg (09/11/2019), 132 mg (09/26/2019), 108 mg (10/17/2019), 108 mg (10/24/2019), 108 mg (11/28/2019), 108 mg (12/05/2019), 108 mg (12/19/2019), 108 mg (12/26/2019), 132 mg (09/19/2019), 108 mg (11/07/2019), 108 mg (10/31/2019), 108 mg (11/14/2019), 108 mg (11/21/2019), 108 mg (12/12/2019), 108 mg (01/02/2020), 108 mg (01/09/2020), 108 mg (01/16/2020), 108 mg (01/23/2020), 108 mg (01/30/2020), 108 mg (02/06/2020) pertuzumab (PERJETA) 840 mg in sodium chloride 0.9 % 250 mL chemo infusion, 840 mg, Intravenous, Once, 8 of 9 cycles Administration: 840 mg (09/04/2019),  420 mg (09/26/2019), 420 mg (10/17/2019), 420 mg (11/28/2019), 420 mg (12/19/2019), 420 mg (11/07/2019), 420 mg (01/09/2020), 420 mg (01/30/2020) trastuzumab-anns (KANJINTI) 357 mg in sodium chloride 0.9 % 250 mL chemo infusion, 6 mg/kg = 357 mg (100 % of original dose 6 mg/kg), Intravenous,  Once, 1 of 2 cycles Dose modification: 6 mg/kg (original dose 6 mg/kg, Cycle 8, Reason: Other (see comments), Comment: patient getting replacement drug) Administration: 357 mg (01/30/2020) trastuzumab-dkst (OGIVRI) 483 mg in sodium chloride 0.9 % 250 mL chemo infusion, 8 mg/kg = 483 mg, Intravenous,  Once, 1 of 1 cycle  for chemotherapy treatment.      Interval history- Sophia [name], 39 year old female diagnosed with stage IV metastatic breast cancer with bone metastases who presents to symptom management clinic for complaints of back pain.  Says that the pain started over the weekend and his persisted since that time. 9/10 severity. Unrelieved by current medication.  Localizes to mid upper back.  Denies injury.  Says she has had a history of back pain in the past but not in this location.  No focal tingling, numbness, or weakness.  Prior history of kyphoplasty at T6, L1, and L4 along with radiofrequency ablation.   Review of systems- Review of Systems  Constitutional: Positive for malaise/fatigue. Negative for chills, fever and weight loss.  HENT: Negative for hearing loss, nosebleeds, sore throat and tinnitus.   Eyes: Negative for blurred vision and double vision.  Respiratory: Negative for cough, hemoptysis, shortness of breath and wheezing.   Cardiovascular: Negative for chest pain, palpitations and leg swelling.  Gastrointestinal: Negative for abdominal pain, blood in stool, constipation, diarrhea, melena, nausea and vomiting.  Genitourinary: Negative for dysuria and urgency.  Musculoskeletal: Positive for back pain. Negative for falls, joint pain and myalgias.  Skin: Negative for itching and rash.   Neurological: Negative for dizziness, tingling, sensory change, loss of consciousness, weakness and headaches.  Endo/Heme/Allergies: Negative for environmental allergies. Does not bruise/bleed easily.  Psychiatric/Behavioral: Negative for depression. The patient has insomnia (Unable to sleep d/t pain). The patient is not nervous/anxious.      No Known Allergies  Past Medical History:  Diagnosis Date  . Anxiety   . Breast cancer ([place])    with mets  . Cancer ([place])   . Colitis   . Vertigo     Past Surgical History:  Procedure Laterality Date  . BREAST BIOPSY Left 08/14/2019   [place] bx of mass, coil marker, path pending  . BREAST BIOPSY Left 08/14/2019   [place] bx of LN, hydromarker, path pending  . BREAST BIOPSY Left 08/14/2019   affirm bx of calcs, x marker, path pending  . ESOPHAGOGASTRODUODENOSCOPY (EGD) WITH PROPOFOL N/A 10/05/2019   Procedure: ESOPHAGOGASTRODUODENOSCOPY (EGD) WITH PROPOFOL;  Surgeon: Lin Landsman, MD;  Location: [place];  Service: Gastroenterology;  Laterality: N/A;  . FLEXIBLE SIGMOIDOSCOPY N/A 10/05/2019   Procedure: FLEXIBLE SIGMOIDOSCOPY;  Surgeon: Lin Landsman, MD;  Location: [facility] ENDOSCOPY;  Service: Gastroenterology;  Laterality: N/A;  . INTRAMEDULLARY (IM) NAIL INTERTROCHANTERIC Right 09/01/2019   Procedure: INTRAMEDULLARY (IM) NAIL INTERTROCHANTRIC AND RADIOFREQUENCY ABLATION;  Surgeon: Hessie Knows, MD;  Location: [facility] OR[facility];  Service: Orthopedics;  Laterality: Right;  . KYPHOPLASTY N/A 08/29/2019   Procedure: KYPHOPLASTY T6, L1,L4 ,  RADIOFREQUENCY ABLATION;  Surgeon: Hessie Knows, MD;  Location: [facility];  Service: Orthopedics;  Laterality: N/A;  . KYPHOPLASTY Right 09/01/2019   Procedure: Right Sacral Radiofrequency Ablation and Cement Augmentation, Right sacrum and iliac crest;  Surgeon: Hessie Knows, MD;  Location: [facility];  Service: Orthopedics;  Laterality: Right;  . PORTA CATH INSERTION N/A 08/28/2019   Procedure: PORTA CATH  INSERTION;  Surgeon: Algernon Huxley, MD;  Location: [place] CV LAB;  Service: Cardiovascular;  Laterality: N/A;    Social History   Socioeconomic History  . Marital status: Single    Spouse name: Not on file  . Number of children: Not on file  . Years of education: Not on file  . Highest education level: Not on file  Occupational History  . Not on file  Tobacco Use  . Smoking status: Never Smoker  . Smokeless tobacco: Never Used  Vaping Use  . Vaping Use: Never used  Substance and Sexual Activity  . Alcohol use: Not Currently  . Drug use: Not Currently  . Sexual activity: Not Currently    Birth control/protection: None  Other Topics Concern  . Not on file  Social History Narrative   Lives at home with children   Social Determinants of Health   Financial Resource Strain:   . Difficulty of Paying Living Expenses: Not on file  Food Insecurity:   . Worried About Charity fundraiser in the Last Year: Not on file  . Ran Out of Food in the Last Year: Not on file  Transportation Needs:   . Lack of Transportation (Medical): Not on file  . Lack of Transportation (Non-Medical): Not on file  Physical Activity:   . Days of Exercise per Week: Not on file  . Minutes of Exercise per Session: Not on file  Stress:   . Feeling of Stress : Not on file  Social Connections:   .  Frequency of Communication with Friends and Family: Not on file  . Frequency of Social Gatherings with Friends and Family: Not on file  . Attends Religious Services: Not on file  . Active Member of Clubs or Organizations: Not on file  . Attends Archivist Meetings: Not on file  . Marital Status: Not on file  Intimate Partner Violence:   . Fear of Current or Ex-Partner: Not on file  . Emotionally Abused: Not on file  . Physically Abused: Not on file  . Sexually Abused: Not on file   Immunization History  Administered Date(s) Administered  . Influenza,inj,Quad PF,6+ Mos 01/02/2020  . PFIZER  SARS-COV-2 Vaccination 01/09/2020     Family History  Problem Relation Age of Onset  . Colon cancer Maternal Uncle      Current Outpatient Medications:  .  diphenoxylate-atropine (LOMOTIL) 2.5-0.025 MG tablet, Take 1 tablet by mouth 4 (four) times daily as needed for diarrhea or loose stools., Disp: 30 tablet, Rfl: 0 .  FLUoxetine (PROZAC) 10 MG capsule, Take 1 capsule (10 mg total) by mouth daily., Disp: 30 capsule, Rfl: 1 .  gabapentin (NEURONTIN) 300 MG capsule, Take 1 capsule (300 mg total) by mouth 2 (two) times daily., Disp: 60 capsule, Rfl: 1 .  lidocaine-prilocaine (EMLA) cream, Apply 1 application topically as needed. Apply small amount to port site at least 1 hour prior to it being accessed, cover with plastic wrap, Disp: 30 g, Rfl: 1 .  oxycodone (OXY-IR) 5 MG capsule, Take 5 mg by mouth every 4 (four) hours as needed., Disp: , Rfl:  .  oxyCODONE (OXYCONTIN) 10 mg 12 hr tablet, Take 1 tablet (10 mg total) by mouth every 12 (twelve) hours., Disp: 60 tablet, Rfl: 0 .  traZODone (DESYREL) 100 MG tablet, Take 1 tablet (100 mg total) by mouth at bedtime., Disp: 30 tablet, Rfl: 2 .  LORazepam (ATIVAN) 0.5 MG tablet, Take 1 tablet (0.5 mg total) by mouth every 8 (eight) hours. (Patient not taking: Reported on 01/02/2020), Disp: 30 tablet, Rfl: 0  Physical exam:  Vitals:   02/10/20 1431  BP: 113/84  Pulse: 73  Resp: 16  Temp: 98.5 F (36.9 C)  TempSrc: Oral  SpO2: 100%  Weight: 127 lb (57.6 kg)  Height: 5' (1.524 m)   Physical Exam Constitutional:      Appearance: She is ill-appearing.  HENT:     Head: Normocephalic.  Eyes:     Conjunctiva/sclera: Conjunctivae normal.  Abdominal:     General: There is no distension.     Palpations: Abdomen is soft.     Tenderness: There is no abdominal tenderness.  Neurological:     Mental Status: She is alert.  Psychiatric:        Mood and Affect: Mood normal.        Behavior: Behavior normal.      CMP Latest Ref Rng & Units  02/06/2020  Glucose 70 - 99 mg/dL 70  BUN 6 - 20 mg/dL 12  Creatinine 0.44 - 1.00 mg/dL 0.52  Sodium 135 - 145 mmol/L 137  Potassium 3.5 - 5.1 mmol/L 3.9  Chloride 98 - 111 mmol/L 103  CO2 22 - 32 mmol/L 24  Calcium 8.9 - 10.3 mg/dL 8.6(L)  Total Protein 6.5 - 8.1 g/dL 6.8  Total Bilirubin 0.3 - 1.2 mg/dL 0.7  Alkaline Phos 38 - 126 U/L 50  AST 15 - 41 U/L 26  ALT 0 - 44 U/L 24   CBC Latest Ref Rng &  Units 02/06/2020  WBC 4.0 - 10.5 K/uL 4.5  Hemoglobin 12.0 - 15.0 g/dL 11.5(L)  Hematocrit 36 - 46 % 35.1(L)  Platelets 150 - 400 K/uL 349    No images are attached to the encounter.  No results found.  Assessment and plan- Patient is a 39 y.o. female diagnosed with metastatic breast cancer to presents with symptom management clinic for complaints of acute midline thoracic back pain. Pain reproducible with exam. Morphine IM in clinic. No acute fracture on xrays. Hold off on additional scans at this time. Defer to Dr. Janese Banks. Continue long and short acting pain medication as prescribed. Continue xgeva for bone metastases. Advised that if pain continues to worsen or does not improve to return to clinic, consider additional imaging to evaluate for pathologic fracture vs malignancy (ct & bone scan). Patient agreeable. Otherwise follow up w/ Dr. Janese Banks as scheduled.    Visit Diagnosis 1. Acute midline thoracic back pain   2. Pain due to malignant neoplasm metastatic to bone [facility])     Patient expressed understanding and was in agreement with this plan. She also understands that She can call clinic at any time with any questions, concerns, or complaints.   Due to language barrier, an interpreter was present during the history-taking and subsequent discussion (and for part of the physical exam) with this patient.  Thank you for allowing me to participate in the care of this very pleasant patient.   Beckey Rutter, DNP, AGNP-C [place] [facility] [place]

## 2020-02-10 NOTE — Telephone Encounter (Signed)
Based on history- start off with xray  but may need mri spine based on what you see

## 2020-02-10 NOTE — Telephone Encounter (Signed)
Patient called reporting that she was not feeling well. I had interpreter to call while I was on phone for more information. Patient reports that she has had pain in upper middle back for 2 days and that her pain medicine is not helping, in fact she states that she is just before tears with the pain and was not able to sleep last night due to the severity. She denies injury or fall, No shortness of breath or difficulty breathing. She is asking for something to help with the pain. Please advise

## 2020-02-10 NOTE — Progress Notes (Signed)
Pt having increased pain in upper back area. Morphine IM injection given for pain. Pt had another driver to come pick her up and take her to [facility] for lumbar xrays. NP Lauren will call pt with results of xray. Monitored pt after injection for 30 minutes. Pt states she feels more relaxed and pain is less. Denies any dizziness at time of discharge. Feels fine.

## 2020-02-10 NOTE — Progress Notes (Signed)
Pt has had pain between her shoulders for several days. The long acting and short acting pain med is not working with this specific pain. She says pain is worse when lying down. Eating ok and sometimes not as hungry. She feels like the pain is like what happens when you rake leaves and use muscles that you don't usually use . It is tight feeling and also tried heating pad and it did not work

## 2020-02-11 ENCOUNTER — Ambulatory Visit: Payer: Self-pay

## 2020-02-16 MED ORDER — OXYCODONE HCL 5 MG PO CAPS
5.0000 mg | ORAL_CAPSULE | ORAL | 0 refills | Status: DC | PRN
Start: 2020-02-16 — End: 2020-04-02

## 2020-02-17 NOTE — Progress Notes (Signed)
I will talk to her on Friday when I see her

## 2020-02-20 ENCOUNTER — Inpatient Hospital Stay: Payer: Self-pay | Attending: Oncology

## 2020-02-20 ENCOUNTER — Encounter: Payer: Self-pay | Admitting: Oncology

## 2020-02-20 ENCOUNTER — Other Ambulatory Visit: Payer: Self-pay

## 2020-02-20 ENCOUNTER — Inpatient Hospital Stay (HOSPITAL_BASED_OUTPATIENT_CLINIC_OR_DEPARTMENT_OTHER): Payer: Self-pay | Admitting: Oncology

## 2020-02-20 ENCOUNTER — Other Ambulatory Visit: Payer: Self-pay | Admitting: *Deleted

## 2020-02-20 ENCOUNTER — Inpatient Hospital Stay: Payer: Self-pay

## 2020-02-20 VITALS — BP 107/59 | HR 78 | Temp 99.1°F | Resp 16 | Wt 132.6 lb

## 2020-02-20 VITALS — BP 133/76 | HR 63 | Resp 16

## 2020-02-20 DIAGNOSIS — C50919 Malignant neoplasm of unspecified site of unspecified female breast: Secondary | ICD-10-CM | POA: Insufficient documentation

## 2020-02-20 DIAGNOSIS — R5383 Other fatigue: Secondary | ICD-10-CM | POA: Insufficient documentation

## 2020-02-20 DIAGNOSIS — G893 Neoplasm related pain (acute) (chronic): Secondary | ICD-10-CM | POA: Insufficient documentation

## 2020-02-20 DIAGNOSIS — C7951 Secondary malignant neoplasm of bone: Secondary | ICD-10-CM | POA: Insufficient documentation

## 2020-02-20 DIAGNOSIS — Z7983 Long term (current) use of bisphosphonates: Secondary | ICD-10-CM | POA: Insufficient documentation

## 2020-02-20 DIAGNOSIS — F32A Depression, unspecified: Secondary | ICD-10-CM

## 2020-02-20 DIAGNOSIS — Z5111 Encounter for antineoplastic chemotherapy: Secondary | ICD-10-CM | POA: Insufficient documentation

## 2020-02-20 DIAGNOSIS — F419 Anxiety disorder, unspecified: Secondary | ICD-10-CM

## 2020-02-20 DIAGNOSIS — M8440XA Pathological fracture, unspecified site, initial encounter for fracture: Secondary | ICD-10-CM

## 2020-02-20 DIAGNOSIS — G4700 Insomnia, unspecified: Secondary | ICD-10-CM | POA: Insufficient documentation

## 2020-02-20 DIAGNOSIS — R5381 Other malaise: Secondary | ICD-10-CM | POA: Insufficient documentation

## 2020-02-20 DIAGNOSIS — Z79899 Other long term (current) drug therapy: Secondary | ICD-10-CM | POA: Insufficient documentation

## 2020-02-20 DIAGNOSIS — F418 Other specified anxiety disorders: Secondary | ICD-10-CM | POA: Insufficient documentation

## 2020-02-20 DIAGNOSIS — Z5112 Encounter for antineoplastic immunotherapy: Secondary | ICD-10-CM | POA: Insufficient documentation

## 2020-02-20 DIAGNOSIS — Z171 Estrogen receptor negative status [ER-]: Secondary | ICD-10-CM | POA: Insufficient documentation

## 2020-02-20 LAB — COMPREHENSIVE METABOLIC PANEL
ALT: 61 U/L — ABNORMAL HIGH (ref 0–44)
AST: 57 U/L — ABNORMAL HIGH (ref 15–41)
Albumin: 3.6 g/dL (ref 3.5–5.0)
Alkaline Phosphatase: 59 U/L (ref 38–126)
Anion gap: 9 (ref 5–15)
BUN: 10 mg/dL (ref 6–20)
CO2: 26 mmol/L (ref 22–32)
Calcium: 8.9 mg/dL (ref 8.9–10.3)
Chloride: 104 mmol/L (ref 98–111)
Creatinine, Ser: 0.61 mg/dL (ref 0.44–1.00)
GFR, Estimated: >60 mL/min (ref >60)
Glucose, Bld: 141 mg/dL — ABNORMAL HIGH (ref 70–99)
Potassium: 3.4 mmol/L — ABNORMAL LOW (ref 3.5–5.1)
Sodium: 139 mmol/L (ref 135–145)
Total Bilirubin: 0.5 mg/dL (ref 0.3–1.2)
Total Protein: 6.6 g/dL (ref 6.5–8.1)

## 2020-02-20 LAB — CBC WITH DIFFERENTIAL/PLATELET
Abs Immature Granulocytes: 0.03 10*3/uL (ref 0.00–0.07)
Basophils Absolute: 0 10*3/uL (ref 0.0–0.1)
Basophils Relative: 0 %
Eosinophils Absolute: 0.1 10*3/uL (ref 0.0–0.5)
Eosinophils Relative: 2 %
HCT: 34.9 % — ABNORMAL LOW (ref 36.0–46.0)
Hemoglobin: 11.3 g/dL — ABNORMAL LOW (ref 12.0–15.0)
Immature Granulocytes: 1 %
Lymphocytes Relative: 21 %
Lymphs Abs: 1.1 10*3/uL (ref 0.7–4.0)
MCH: 28.4 pg (ref 26.0–34.0)
MCHC: 32.4 g/dL (ref 30.0–36.0)
MCV: 87.7 fL (ref 80.0–100.0)
Monocytes Absolute: 0.4 10*3/uL (ref 0.1–1.0)
Monocytes Relative: 8 %
Neutro Abs: 3.6 10*3/uL (ref 1.7–7.7)
Neutrophils Relative %: 68 %
Platelets: 337 10*3/uL (ref 150–400)
RBC: 3.98 MIL/uL (ref 3.87–5.11)
RDW: 15.2 % (ref 11.5–15.5)
WBC: 5.2 10*3/uL (ref 4.0–10.5)
nRBC: 0 % (ref 0.0–0.2)

## 2020-02-20 MED ORDER — SODIUM CHLORIDE 0.9 % IV SOLN
65.0000 mg/m2 | Freq: Once | INTRAVENOUS | Status: AC
  Administered 2020-02-20: 108 mg via INTRAVENOUS
  Filled 2020-02-20: qty 18

## 2020-02-20 MED ORDER — SODIUM CHLORIDE 0.9 % IV SOLN
20.0000 mg | Freq: Once | INTRAVENOUS | Status: AC
  Administered 2020-02-20: 20 mg via INTRAVENOUS
  Filled 2020-02-20: qty 20

## 2020-02-20 MED ORDER — FAMOTIDINE IN NACL 20-0.9 MG/50ML-% IV SOLN
20.0000 mg | Freq: Once | INTRAVENOUS | Status: AC
  Administered 2020-02-20: 20 mg via INTRAVENOUS
  Filled 2020-02-20: qty 50

## 2020-02-20 MED ORDER — HEPARIN SOD (PORK) LOCK FLUSH 100 UNIT/ML IV SOLN
INTRAVENOUS | Status: AC
  Filled 2020-02-20: qty 5

## 2020-02-20 MED ORDER — HEPARIN SOD (PORK) LOCK FLUSH 100 UNIT/ML IV SOLN
500.0000 [IU] | Freq: Once | INTRAVENOUS | Status: AC | PRN
  Administered 2020-02-20: 500 [IU]
  Filled 2020-02-20: qty 5

## 2020-02-20 MED ORDER — ACETAMINOPHEN 325 MG PO TABS
650.0000 mg | ORAL_TABLET | Freq: Once | ORAL | Status: AC
  Administered 2020-02-20: 650 mg via ORAL
  Filled 2020-02-20: qty 2

## 2020-02-20 MED ORDER — SODIUM CHLORIDE 0.9 % IV SOLN
Freq: Once | INTRAVENOUS | Status: AC
  Filled 2020-02-20: qty 250

## 2020-02-20 MED ORDER — DIPHENHYDRAMINE HCL 50 MG/ML IJ SOLN
50.0000 mg | Freq: Once | INTRAMUSCULAR | Status: AC
  Administered 2020-02-20: 50 mg via INTRAVENOUS
  Filled 2020-02-20: qty 1

## 2020-02-20 MED ORDER — TRASTUZUMAB-ANNS CHEMO 150 MG IV SOLR
6.0000 mg/kg | Freq: Once | INTRAVENOUS | Status: AC
  Administered 2020-02-20: 357 mg via INTRAVENOUS
  Filled 2020-02-20: qty 17

## 2020-02-20 MED ORDER — SODIUM CHLORIDE 0.9 % IV SOLN
420.0000 mg | Freq: Once | INTRAVENOUS | Status: AC
  Administered 2020-02-20: 420 mg via INTRAVENOUS
  Filled 2020-02-20: qty 14

## 2020-02-20 MED ORDER — GABAPENTIN 300 MG PO CAPS
300.0000 mg | ORAL_CAPSULE | Freq: Two times a day (BID) | ORAL | 1 refills | Status: DC
Start: 2020-02-20 — End: 2020-04-16

## 2020-02-20 MED ORDER — LIDOCAINE-PRILOCAINE 2.5-2.5 % EX CREA
1.0000 | TOPICAL_CREAM | CUTANEOUS | 1 refills | Status: DC | PRN
Start: 2020-02-20 — End: 2021-04-22

## 2020-02-20 NOTE — Progress Notes (Signed)
Hematology/Oncology Consult note [facility]  [phone] [phone]  Patient Care Team: Center, [place], NP as PCP - General Rico Junker, RN as Registered Nurse Theodore Demark, RN as Registered Nurse   Name of the patient: Sophia [name]  [id number]  06-Apr-1980   Date of visit: 02/20/20  Diagnosis- stage IV metastatic breast cancer ER/PR negative HER-2/neu positive with bone metastases  Chief complaint/ Reason for visit-on treatment assessment prior to cycle 9-day 1 of Taxol Herceptin and Perjeta  Heme/Onc history: patient is a 39 year old Hispanic female who is here with her friend. History obtained with the help of an interpreter.Patient self palpated left breast mass which was followed by a diagnostic bilateral mammogram. Mammogram showed 3.1 x 2.9 x 1.9 cm hypoechoic mass at the 1 o'clock position of the left breast. For abnormal cortically thickened left axillary lymph nodes measuring up to 5 mm. 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 Patient was also having ongoing back pain and was seen by [facility] orthopedics Dr. Doyle Askew who ordered MRI lumbar spine without contrast which showed possible pathologic fractures of L1 and L4 vertebral bodies with greater than 50% height loss at L1 and abnormal signal involving L2-L3 S1 as well as right iliac bone concerning for metastatic disease.Patient is a single mother of 3 adult children and is very anxious today.She reports significant back pain which radiates to her bilateral thighs. Denies any focal tingling numbness or weakness. Denies any bowel bladder incontinence. Pain has been uncontrolled despite taking Tylenol. No prior history of abnormal breast biopsies. No family history of breast cancer  PET and MRIshowed 3 areas of pathologic fracture of  her spine as well as widespread bony metastatic disease and concern for impending fracture of the right hip. Given her worsening pain she was asked to come to the ER. She has been evaluated by Dr. Rudene Christians from orthopedic surgery and underwent kyphoplasty at 3 different levels.T6 L1 and L4 along with radiofrequency ablation.She also underwent prophylactic fixation of the right hip and not affected the sacral region.   Patient received first dose of Herceptin and Perjeta on 09/04/2019. Baseline echocardiogram normal.she is currently getting taxol/ herceptin/perjeta  Patient admitted to hospital for acute abdominal pain with CT findings concerning for acute colitis. Colonoscopy showed diffuse severe inflammation with erythema friability and loss of vascularity and shallow ulcerations in the IC valve, ascending colon and cecum.   Interval history-patient reports that her upper mid back pain which she had a week ago is getting better.  She now reports some pain in her left breast and along the left chest wall for the last few days.  Denies any exertional shortness of breath.  Has ongoing fatigue.  Denies any tingling numbness in her hands and feet.  ECOG PS- 1 Pain scale- 0 Opioid associated constipation- no  Review of systems- Review of Systems  Constitutional: Positive for malaise/fatigue. Negative for chills, fever and weight loss.  HENT: Negative for congestion, ear discharge and nosebleeds.   Eyes: Negative for blurred vision.  Respiratory: Negative for cough, hemoptysis, sputum production, shortness of breath and wheezing.   Cardiovascular: Negative for chest pain, palpitations, orthopnea and claudication.  Gastrointestinal: Negative for abdominal pain, blood in stool, constipation, diarrhea, heartburn, melena, nausea and vomiting.  Genitourinary: Negative for dysuria, flank pain, frequency, hematuria and urgency.  Musculoskeletal:  Negative for back pain, joint pain and myalgias.        Left chest wall pain  Skin: Negative for rash.  Neurological: Negative for dizziness, tingling, focal weakness, seizures, weakness and headaches.  Endo/Heme/Allergies: Does not bruise/bleed easily.  Psychiatric/Behavioral: Negative for depression and suicidal ideas. The patient does not have insomnia.       No Known Allergies   Past Medical History:  Diagnosis Date  . Anxiety   . Breast cancer ([place])    with mets  . Cancer ([place])   . Colitis   . Vertigo      Past Surgical History:  Procedure Laterality Date  . BREAST BIOPSY Left 08/14/2019   [place] bx of mass, coil marker, path pending  . BREAST BIOPSY Left 08/14/2019   [place] bx of LN, hydromarker, path pending  . BREAST BIOPSY Left 08/14/2019   affirm bx of calcs, x marker, path pending  . ESOPHAGOGASTRODUODENOSCOPY (EGD) WITH PROPOFOL N/A 10/05/2019   Procedure: ESOPHAGOGASTRODUODENOSCOPY (EGD) WITH PROPOFOL;  Surgeon: Lin Landsman, MD;  Location: [facility];  Service: Gastroenterology;  Laterality: N/A;  . FLEXIBLE SIGMOIDOSCOPY N/A 10/05/2019   Procedure: FLEXIBLE SIGMOIDOSCOPY;  Surgeon: Lin Landsman, MD;  Location: [facility] ENDOSCOPY;  Service: Gastroenterology;  Laterality: N/A;  . INTRAMEDULLARY (IM) NAIL INTERTROCHANTERIC Right 09/01/2019   Procedure: INTRAMEDULLARY (IM) NAIL INTERTROCHANTRIC AND RADIOFREQUENCY ABLATION;  Surgeon: Hessie Knows, MD;  Location: [facility] ORS;  Service: Orthopedics;  Laterality: Right;  . KYPHOPLASTY N/A 08/29/2019   Procedure: KYPHOPLASTY T6, L1,L4 ,  RADIOFREQUENCY ABLATION;  Surgeon: Hessie Knows, MD;  Location: [facility] ORS;  Service: Orthopedics;  Laterality: N/A;  . KYPHOPLASTY Right 09/01/2019   Procedure: Right Sacral Radiofrequency Ablation and Cement Augmentation, Right sacrum and iliac crest;  Surgeon: Hessie Knows, MD;  Location: [facility] ORS;  Service: Orthopedics;  Laterality: Right;  . PORTA CATH INSERTION N/A 08/28/2019   Procedure: PORTA CATH INSERTION;  Surgeon: Algernon Huxley,  MD;  Location: [facility];  Service: Cardiovascular;  Laterality: N/A;    Social History   Socioeconomic History  . Marital status: Single    Spouse name: Not on file  . Number of children: Not on file  . Years of education: Not on file  . Highest education level: Not on file  Occupational History  . Not on file  Tobacco Use  . Smoking status: Never Smoker  . Smokeless tobacco: Never Used  Vaping Use  . Vaping Use: Never used  Substance and Sexual Activity  . Alcohol use: Not Currently  . Drug use: Not Currently  . Sexual activity: Not Currently    Birth control/protection: None  Other Topics Concern  . Not on file  Social History Narrative   Lives at home with children   Social Determinants of Health   Financial Resource Strain:   . Difficulty of Paying Living Expenses: Not on file  Food Insecurity:   . Worried About Charity fundraiser in the Last Year: Not on file  . Ran Out of Food in the Last Year: Not on file  Transportation Needs:   . Lack of Transportation (Medical): Not on file  . Lack of Transportation (Non-Medical): Not on file  Physical Activity:   . Days of Exercise per Week: Not on file  . Minutes of Exercise per Session: Not on file  Stress:   . Feeling of Stress : Not on file  Social Connections:   . Frequency of Communication with Friends and Family: Not on  file  . Frequency of Social Gatherings with Friends and Family: Not on file  . Attends Religious Services: Not on file  . Active Member of Clubs or Organizations: Not on file  . Attends Archivist Meetings: Not on file  . Marital Status: Not on file  Intimate Partner Violence:   . Fear of Current or Ex-Partner: Not on file  . Emotionally Abused: Not on file  . Physically Abused: Not on file  . Sexually Abused: Not on file    Family History  Problem Relation Age of Onset  . Colon cancer Maternal Uncle      Current Outpatient Medications:  .  FLUoxetine (PROZAC) 10  MG capsule, Take 1 capsule (10 mg total) by mouth daily., Disp: 30 capsule, Rfl: 1 .  oxyCODONE (OXYCONTIN) 10 mg 12 hr tablet, Take 1 tablet (10 mg total) by mouth every 12 (twelve) hours., Disp: 60 tablet, Rfl: 0 .  traZODone (DESYREL) 100 MG tablet, Take 1 tablet (100 mg total) by mouth at bedtime., Disp: 30 tablet, Rfl: 2 .  diphenoxylate-atropine (LOMOTIL) 2.5-0.025 MG tablet, Take 1 tablet by mouth 4 (four) times daily as needed for diarrhea or loose stools. (Patient not taking: Reported on 02/20/2020), Disp: 30 tablet, Rfl: 0 .  gabapentin (NEURONTIN) 300 MG capsule, Take 1 capsule (300 mg total) by mouth 2 (two) times daily., Disp: 60 capsule, Rfl: 1 .  lidocaine-prilocaine (EMLA) cream, Apply 1 application topically as needed. Apply small amount to port site at least 1 hour prior to it being accessed, cover with plastic wrap, Disp: 30 g, Rfl: 1 .  LORazepam (ATIVAN) 0.5 MG tablet, Take 1 tablet (0.5 mg total) by mouth every 8 (eight) hours. (Patient not taking: Reported on 01/02/2020), Disp: 30 tablet, Rfl: 0 .  oxycodone (OXY-IR) 5 MG capsule, Take 1-2 capsules (5-10 mg total) by mouth every 4 (four) hours as needed. (Patient not taking: Reported on 02/20/2020), Disp: 60 capsule, Rfl: 0 No current facility-administered medications for this visit.  Facility-Administered Medications Ordered in Other Visits:  .  heparin lock flush 100 unit/mL, 500 Units, Intracatheter, Once PRN, Sindy Guadeloupe, MD .  PACLitaxel (TAXOL) 108 mg in sodium chloride 0.9 % 250 mL chemo infusion (</= $RemoveBefor'80mg'GSzePHDCtgys$ /m2), 65 mg/m2 (Treatment Plan Recorded), Intravenous, Once, Sindy Guadeloupe, MD  Physical exam:  Vitals:   02/20/20 0945  BP: (!) 107/59  Pulse: 78  Resp: 16  Temp: 99.1 F (37.3 C)  TempSrc: Tympanic  SpO2: 100%  Weight: 132 lb 9.6 oz (60.1 kg)   Physical Exam Constitutional:      General: She is not in acute distress. Cardiovascular:     Rate and Rhythm: Normal rate and regular rhythm.     Heart  sounds: Normal heart sounds.  Pulmonary:     Effort: Pulmonary effort is normal.     Breath sounds: Normal breath sounds.  Abdominal:     General: Bowel sounds are normal.     Palpations: Abdomen is soft.  Skin:    General: Skin is warm and dry.  Neurological:     Mental Status: She is alert and oriented to person, place, and time.   Breast exam: No palpable masses in the left breast.  No palpable left axillary adenopathy.  There is mild tenderness to palpation along the left chest wall in the region of seventh and eighth rib but no palpable masses seen in that region.  CMP Latest Ref Rng & Units 02/20/2020  Glucose 70 -  99 mg/dL 141(H)  BUN 6 - 20 mg/dL 10  Creatinine 0.44 - 1.00 mg/dL 0.61  Sodium 135 - 145 mmol/L 139  Potassium 3.5 - 5.1 mmol/L 3.4(L)  Chloride 98 - 111 mmol/L 104  CO2 22 - 32 mmol/L 26  Calcium 8.9 - 10.3 mg/dL 8.9  Total Protein 6.5 - 8.1 g/dL 6.6  Total Bilirubin 0.3 - 1.2 mg/dL 0.5  Alkaline Phos 38 - 126 U/L 59  AST 15 - 41 U/L 57(H)  ALT 0 - 44 U/L 61(H)   CBC Latest Ref Rng & Units 02/20/2020  WBC 4.0 - 10.5 K/uL 5.2  Hemoglobin 12.0 - 15.0 g/dL 11.3(L)  Hematocrit 36 - 46 % 34.9(L)  Platelets 150 - 400 K/uL 337    No images are attached to the encounter.  DG Thoracic Spine 2 View  Result Date: 02/10/2020 CLINICAL DATA:  Increased back pain EXAM: THORACIC SPINE 2 VIEWS COMPARISON:  None. FINDINGS: Prior vertebroplasty noted at L1, L4 and T6. No acute fracture. No focal bone lesion or subluxation. Right Port-A-Cath in place with the tip in the right atrium. Visualized lungs clear. IMPRESSION: No acute bony abnormality. Electronically Signed   By: Rolm Baptise M.D.   On: 02/10/2020 16:45     Assessment and plan- Patient is a 39 y.o. female with metastatic ER negative her 2 positive breast cancer on taxol/herceptin and perjeta.She is here for on treatment assessment prior to cycle 9-day 1 of Taxol Herceptin and Perjeta  Counts okay to proceed  with cycle 9-day 1 of Taxol Herceptin and Perjeta today.  She will proceed with Taxol and 1 week's time and I will see her back in 2 weeks for cycle 9-day 15.  Recent scans in September 2021 showed excellent response to treatment.  I will plan on repeating imaging in January 2022.   Left chest wall pain: Appears musculoskeletal based on history and physical exam.  Hold off on any additional scans at this time.  Back pain which she had on 02/10/2020 is getting better.  Thoracic spine x-ray did not show any acute fracture.  She will continue taking as needed oxycodone and gabapentin for her pain.  Anxiety and depression: Continue Prozac and as needed Ativan.  She will receive Xgeva in 1 week for her bone metastases.   Visit Diagnosis 1. Neoplasm related pain   2. Anxiety and depression   3. Bone metastases ([place])   4. Metastatic breast cancer ([place])   5. Long term (current) use of bisphosphonates      Dr. Randa Evens, MD, MPH [facility] [phone] 02/20/2020 12:54 PM

## 2020-02-20 NOTE — Progress Notes (Signed)
Patient tolerated infusion well. Patient and VSS. Discharged home  

## 2020-02-20 NOTE — Progress Notes (Signed)
Pt states that er appetite was not good after last chemo and taste was a problem but as time went by the eating got back to normal. She has gained wt. Not sure how it happened. She felt something on her left breast and feels like it is inflamed also.

## 2020-02-27 ENCOUNTER — Ambulatory Visit: Payer: Self-pay

## 2020-02-27 ENCOUNTER — Other Ambulatory Visit: Payer: Self-pay

## 2020-02-27 ENCOUNTER — Inpatient Hospital Stay: Payer: Self-pay

## 2020-02-27 VITALS — BP 117/67 | HR 72 | Temp 97.6°F | Resp 16 | Wt 132.0 lb

## 2020-02-27 DIAGNOSIS — C7951 Secondary malignant neoplasm of bone: Secondary | ICD-10-CM

## 2020-02-27 DIAGNOSIS — M8440XA Pathological fracture, unspecified site, initial encounter for fracture: Secondary | ICD-10-CM

## 2020-02-27 DIAGNOSIS — C50919 Malignant neoplasm of unspecified site of unspecified female breast: Secondary | ICD-10-CM

## 2020-02-27 LAB — CBC WITH DIFFERENTIAL/PLATELET
Abs Immature Granulocytes: 0.05 10*3/uL (ref 0.00–0.07)
Basophils Absolute: 0 10*3/uL (ref 0.0–0.1)
Basophils Relative: 0 %
Eosinophils Absolute: 0.2 10*3/uL (ref 0.0–0.5)
Eosinophils Relative: 3 %
HCT: 34.4 % — ABNORMAL LOW (ref 36.0–46.0)
Hemoglobin: 11.1 g/dL — ABNORMAL LOW (ref 12.0–15.0)
Immature Granulocytes: 1 %
Lymphocytes Relative: 23 %
Lymphs Abs: 1.2 10*3/uL (ref 0.7–4.0)
MCH: 28 pg (ref 26.0–34.0)
MCHC: 32.3 g/dL (ref 30.0–36.0)
MCV: 86.6 fL (ref 80.0–100.0)
Monocytes Absolute: 0.3 10*3/uL (ref 0.1–1.0)
Monocytes Relative: 5 %
Neutro Abs: 3.5 10*3/uL (ref 1.7–7.7)
Neutrophils Relative %: 68 %
Platelets: 346 10*3/uL (ref 150–400)
RBC: 3.97 MIL/uL (ref 3.87–5.11)
RDW: 15.1 % (ref 11.5–15.5)
WBC: 5.1 10*3/uL (ref 4.0–10.5)
nRBC: 0 % (ref 0.0–0.2)

## 2020-02-27 LAB — COMPREHENSIVE METABOLIC PANEL
ALT: 55 U/L — ABNORMAL HIGH (ref 0–44)
AST: 54 U/L — ABNORMAL HIGH (ref 15–41)
Albumin: 3.7 g/dL (ref 3.5–5.0)
Alkaline Phosphatase: 63 U/L (ref 38–126)
Anion gap: 6 (ref 5–15)
BUN: 11 mg/dL (ref 6–20)
CO2: 28 mmol/L (ref 22–32)
Calcium: 8.8 mg/dL — ABNORMAL LOW (ref 8.9–10.3)
Chloride: 102 mmol/L (ref 98–111)
Creatinine, Ser: 0.54 mg/dL (ref 0.44–1.00)
GFR, Estimated: >60 mL/min (ref >60)
Glucose, Bld: 140 mg/dL — ABNORMAL HIGH (ref 70–99)
Potassium: 3.4 mmol/L — ABNORMAL LOW (ref 3.5–5.1)
Sodium: 136 mmol/L (ref 135–145)
Total Bilirubin: 0.5 mg/dL (ref 0.3–1.2)
Total Protein: 6.5 g/dL (ref 6.5–8.1)

## 2020-02-27 MED ORDER — SODIUM CHLORIDE 0.9 % IV SOLN
20.0000 mg | Freq: Once | INTRAVENOUS | Status: AC
  Administered 2020-02-27: 20 mg via INTRAVENOUS
  Filled 2020-02-27: qty 20

## 2020-02-27 MED ORDER — SODIUM CHLORIDE 0.9 % IV SOLN
Freq: Once | INTRAVENOUS | Status: AC
  Filled 2020-02-27: qty 250

## 2020-02-27 MED ORDER — SODIUM CHLORIDE 0.9 % IV SOLN
65.0000 mg/m2 | Freq: Once | INTRAVENOUS | Status: AC
  Administered 2020-02-27: 108 mg via INTRAVENOUS
  Filled 2020-02-27: qty 18

## 2020-02-27 MED ORDER — HEPARIN SOD (PORK) LOCK FLUSH 100 UNIT/ML IV SOLN
500.0000 [IU] | Freq: Once | INTRAVENOUS | Status: AC | PRN
  Administered 2020-02-27: 500 [IU]
  Filled 2020-02-27: qty 5

## 2020-02-27 MED ORDER — SODIUM CHLORIDE 0.9% FLUSH
10.0000 mL | INTRAVENOUS | Status: DC | PRN
Start: 2020-02-27 — End: 2020-02-27
  Administered 2020-02-27: 10 mL
  Filled 2020-02-27: qty 10

## 2020-02-27 MED ORDER — FAMOTIDINE IN NACL 20-0.9 MG/50ML-% IV SOLN
20.0000 mg | Freq: Once | INTRAVENOUS | Status: AC
  Administered 2020-02-27: 20 mg via INTRAVENOUS
  Filled 2020-02-27: qty 50

## 2020-02-27 MED ORDER — DENOSUMAB 120 MG/1.7ML ~~LOC~~ SOLN
120.0000 mg | Freq: Once | SUBCUTANEOUS | Status: AC
  Administered 2020-02-27: 120 mg via SUBCUTANEOUS
  Filled 2020-02-27: qty 1.7

## 2020-02-27 MED ORDER — KETOROLAC TROMETHAMINE 15 MG/ML IJ SOLN
30.0000 mg | Freq: Once | INTRAMUSCULAR | Status: AC
  Administered 2020-02-27: 30 mg via INTRAVENOUS
  Filled 2020-02-27: qty 2

## 2020-02-27 MED ORDER — DIPHENHYDRAMINE HCL 50 MG/ML IJ SOLN
50.0000 mg | Freq: Once | INTRAMUSCULAR | Status: AC
  Administered 2020-02-27: 50 mg via INTRAVENOUS
  Filled 2020-02-27: qty 1

## 2020-02-27 NOTE — Progress Notes (Signed)
Patient received prescribed treatment in clinic. Tolerated well. Patient stable at discharge. 

## 2020-03-04 ENCOUNTER — Other Ambulatory Visit: Payer: Self-pay

## 2020-03-04 DIAGNOSIS — C50919 Malignant neoplasm of unspecified site of unspecified female breast: Secondary | ICD-10-CM

## 2020-03-05 ENCOUNTER — Other Ambulatory Visit: Payer: Self-pay

## 2020-03-05 ENCOUNTER — Inpatient Hospital Stay: Payer: Self-pay

## 2020-03-05 ENCOUNTER — Encounter: Payer: Self-pay | Admitting: Oncology

## 2020-03-05 ENCOUNTER — Ambulatory Visit: Payer: Self-pay

## 2020-03-05 ENCOUNTER — Inpatient Hospital Stay (HOSPITAL_BASED_OUTPATIENT_CLINIC_OR_DEPARTMENT_OTHER): Payer: Self-pay | Admitting: Oncology

## 2020-03-05 VITALS — BP 114/82 | HR 75 | Temp 98.3°F | Resp 18 | Wt 129.7 lb

## 2020-03-05 VITALS — BP 112/81 | HR 63 | Resp 18

## 2020-03-05 DIAGNOSIS — M8440XA Pathological fracture, unspecified site, initial encounter for fracture: Secondary | ICD-10-CM

## 2020-03-05 DIAGNOSIS — C50919 Malignant neoplasm of unspecified site of unspecified female breast: Secondary | ICD-10-CM

## 2020-03-05 DIAGNOSIS — Z5111 Encounter for antineoplastic chemotherapy: Secondary | ICD-10-CM

## 2020-03-05 LAB — COMPREHENSIVE METABOLIC PANEL
ALT: 33 U/L (ref 0–44)
AST: 36 U/L (ref 15–41)
Albumin: 3.7 g/dL (ref 3.5–5.0)
Alkaline Phosphatase: 56 U/L (ref 38–126)
Anion gap: 7 (ref 5–15)
BUN: 8 mg/dL (ref 6–20)
CO2: 26 mmol/L (ref 22–32)
Calcium: 8.5 mg/dL — ABNORMAL LOW (ref 8.9–10.3)
Chloride: 105 mmol/L (ref 98–111)
Creatinine, Ser: 0.39 mg/dL — ABNORMAL LOW (ref 0.44–1.00)
GFR, Estimated: >60 mL/min (ref >60)
Glucose, Bld: 115 mg/dL — ABNORMAL HIGH (ref 70–99)
Potassium: 3.4 mmol/L — ABNORMAL LOW (ref 3.5–5.1)
Sodium: 138 mmol/L (ref 135–145)
Total Bilirubin: 0.6 mg/dL (ref 0.3–1.2)
Total Protein: 6.9 g/dL (ref 6.5–8.1)

## 2020-03-05 LAB — CBC WITH DIFFERENTIAL/PLATELET
Abs Immature Granulocytes: 0.06 10*3/uL (ref 0.00–0.07)
Basophils Absolute: 0 10*3/uL (ref 0.0–0.1)
Basophils Relative: 1 %
Eosinophils Absolute: 0.1 10*3/uL (ref 0.0–0.5)
Eosinophils Relative: 1 %
HCT: 34.1 % — ABNORMAL LOW (ref 36.0–46.0)
Hemoglobin: 11.3 g/dL — ABNORMAL LOW (ref 12.0–15.0)
Immature Granulocytes: 1 %
Lymphocytes Relative: 18 %
Lymphs Abs: 1.1 10*3/uL (ref 0.7–4.0)
MCH: 28.3 pg (ref 26.0–34.0)
MCHC: 33.1 g/dL (ref 30.0–36.0)
MCV: 85.5 fL (ref 80.0–100.0)
Monocytes Absolute: 0.3 10*3/uL (ref 0.1–1.0)
Monocytes Relative: 4 %
Neutro Abs: 4.4 10*3/uL (ref 1.7–7.7)
Neutrophils Relative %: 75 %
Platelets: 374 10*3/uL (ref 150–400)
RBC: 3.99 MIL/uL (ref 3.87–5.11)
RDW: 15.4 % (ref 11.5–15.5)
WBC: 5.9 10*3/uL (ref 4.0–10.5)
nRBC: 0 % (ref 0.0–0.2)

## 2020-03-05 MED ORDER — SODIUM CHLORIDE 0.9 % IV SOLN
Freq: Once | INTRAVENOUS | Status: AC
  Filled 2020-03-05: qty 250

## 2020-03-05 MED ORDER — HEPARIN SOD (PORK) LOCK FLUSH 100 UNIT/ML IV SOLN
500.0000 [IU] | Freq: Once | INTRAVENOUS | Status: AC
  Administered 2020-03-05: 500 [IU] via INTRAVENOUS
  Filled 2020-03-05: qty 5

## 2020-03-05 MED ORDER — DIPHENHYDRAMINE HCL 50 MG/ML IJ SOLN
50.0000 mg | Freq: Once | INTRAMUSCULAR | Status: AC
  Administered 2020-03-05: 50 mg via INTRAVENOUS
  Filled 2020-03-05: qty 1

## 2020-03-05 MED ORDER — SODIUM CHLORIDE 0.9% FLUSH
10.0000 mL | Freq: Once | INTRAVENOUS | Status: AC
  Administered 2020-03-05: 10 mL via INTRAVENOUS
  Filled 2020-03-05: qty 10

## 2020-03-05 MED ORDER — FAMOTIDINE IN NACL 20-0.9 MG/50ML-% IV SOLN
20.0000 mg | Freq: Once | INTRAVENOUS | Status: AC
  Administered 2020-03-05: 20 mg via INTRAVENOUS
  Filled 2020-03-05: qty 50

## 2020-03-05 MED ORDER — SODIUM CHLORIDE 0.9 % IV SOLN
65.0000 mg/m2 | Freq: Once | INTRAVENOUS | Status: AC
  Administered 2020-03-05: 108 mg via INTRAVENOUS
  Filled 2020-03-05: qty 18

## 2020-03-05 MED ORDER — OXYCODONE HCL ER 10 MG PO T12A: 10.0000 mg | EXTENDED_RELEASE_TABLET | Freq: Two times a day (BID) | ORAL | 0 refills | Status: DC

## 2020-03-05 MED ORDER — DEXAMETHASONE SODIUM PHOSPHATE 100 MG/10ML IJ SOLN
20.0000 mg | Freq: Once | INTRAMUSCULAR | Status: AC
  Administered 2020-03-05: 20 mg via INTRAVENOUS
  Filled 2020-03-05: qty 20

## 2020-03-05 MED ORDER — HEPARIN SOD (PORK) LOCK FLUSH 100 UNIT/ML IV SOLN
INTRAVENOUS | Status: AC
  Filled 2020-03-05: qty 5

## 2020-03-05 MED ORDER — HEPARIN SOD (PORK) LOCK FLUSH 100 UNIT/ML IV SOLN
500.0000 [IU] | Freq: Once | INTRAVENOUS | Status: DC | PRN
  Filled 2020-03-05: qty 5

## 2020-03-05 NOTE — Progress Notes (Signed)
Hematology/Oncology Consult note [facility]  Telephone:([phone] [phone]  Patient Care Team: Center, Philip, NP as PCP - General Rico Junker, RN as Registered Nurse Theodore Demark, RN as Registered Nurse   Name of the patient: Sophia [name]  [id number]  1980/11/10   Date of visit: 03/05/20  Diagnosis- stage IV metastatic breast cancer ER/PR negative HER-2/neu positive with bone metastases  Chief complaint/ Reason for visit-on treatment assessment prior to cycle 9-day 15 of Taxol  Heme/Onc history: patient is a 39 year old Hispanic female who is here with her friend. History obtained with the help of an interpreter.Patient self palpated left breast mass which was followed by a diagnostic bilateral mammogram. Mammogram showed 3.1 x 2.9 x 1.9 cm hypoechoic mass at the 1 o'clock position of the left breast. For abnormal cortically thickened left axillary lymph nodes measuring up to 5 mm. 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 Patient was also having ongoing back pain and was seen by [facility] orthopedics Dr. Doyle Askew who ordered MRI lumbar spine without contrast which showed possible pathologic fractures of L1 and L4 vertebral bodies with greater than 50% height loss at L1 and abnormal signal involving L2-L3 S1 as well as right iliac bone concerning for metastatic disease.Patient is a single mother of 3 adult children and is very anxious today.She reports significant back pain which radiates to her bilateral thighs. Denies any focal tingling numbness or weakness. Denies any bowel bladder incontinence. Pain has been uncontrolled despite taking Tylenol. No prior history of abnormal breast biopsies. No family history of breast cancer  PET and MRIshowed 3 areas of pathologic fracture of her spine as well as  widespread bony metastatic disease and concern for impending fracture of the right hip. Given her worsening pain she was asked to come to the ER. She has been evaluated by Dr. Rudene Christians from orthopedic surgery and underwent kyphoplasty at 3 different levels.T6 L1 and L4 along with radiofrequency ablation.She also underwent prophylactic fixation of the right hip and not affected the sacral region.   Patient received first dose of Herceptin and Perjeta on 09/04/2019. Baseline echocardiogram normal.she is currently getting taxol/ herceptin/perjeta  Patient admitted to hospital for acute abdominal pain with CT findings concerning for acute colitis. Colonoscopy showed diffuse severe inflammation with erythema friability and loss of vascularity and shallow ulcerations in the IC valve, ascending colon and cecum.    Interval history-patient reports no significant tingling numbness with Taxol.  She is tolerating chemotherapy well without any significant nausea or vomiting.  Does have baseline fatigue and gets tearful often.  She is on Prozac for depression.  She takes trazodone for sleep.  Reports no significant pain in her back at this time.  She does have some chronic left-sided flank pain for which he uses OxyContin and as needed oxycodone.  Bowel movements have been regular  ECOG PS- 1 Pain scale- 3 Opioid associated constipation- no  Review of systems- Review of Systems  Constitutional: Positive for malaise/fatigue. Negative for chills, fever and weight loss.  HENT: Negative for congestion, ear discharge and nosebleeds.   Eyes: Negative for blurred vision.  Respiratory: Negative for cough, hemoptysis, sputum production, shortness of breath and wheezing.   Cardiovascular: Negative for chest pain, palpitations, orthopnea and claudication.  Gastrointestinal: Negative for abdominal pain, blood in stool, constipation, diarrhea, heartburn, melena,  nausea and vomiting.  Genitourinary: Negative for  dysuria, flank pain, frequency, hematuria and urgency.  Musculoskeletal: Negative for back pain, joint pain and myalgias.       Left flank pain  Skin: Negative for rash.  Neurological: Negative for dizziness, tingling, focal weakness, seizures, weakness and headaches.  Endo/Heme/Allergies: Does not bruise/bleed easily.  Psychiatric/Behavioral: Negative for depression and suicidal ideas. The patient does not have insomnia.       No Known Allergies   Past Medical History:  Diagnosis Date  . Anxiety   . Breast cancer ([place])    with mets  . Cancer ([place])   . Colitis   . Vertigo      Past Surgical History:  Procedure Laterality Date  . BREAST BIOPSY Left 08/14/2019   [place] bx of mass, coil marker, path pending  . BREAST BIOPSY Left 08/14/2019   [place] bx of LN, hydromarker, path pending  . BREAST BIOPSY Left 08/14/2019   affirm bx of calcs, x marker, path pending  . ESOPHAGOGASTRODUODENOSCOPY (EGD) WITH PROPOFOL N/A 10/05/2019   Procedure: ESOPHAGOGASTRODUODENOSCOPY (EGD) WITH PROPOFOL;  Surgeon: Lin Landsman, MD;  Location: [facility];  Service: Gastroenterology;  Laterality: N/A;  . FLEXIBLE SIGMOIDOSCOPY N/A 10/05/2019   Procedure: FLEXIBLE SIGMOIDOSCOPY;  Surgeon: Lin Landsman, MD;  Location: [facility] ENDOSCOPY;  Service: Gastroenterology;  Laterality: N/A;  . INTRAMEDULLARY (IM) NAIL INTERTROCHANTERIC Right 09/01/2019   Procedure: INTRAMEDULLARY (IM) NAIL INTERTROCHANTRIC AND RADIOFREQUENCY ABLATION;  Surgeon: Hessie Knows, MD;  Location: [facility] ORS;  Service: Orthopedics;  Laterality: Right;  . KYPHOPLASTY N/A 08/29/2019   Procedure: KYPHOPLASTY T6, L1,L4 ,  RADIOFREQUENCY ABLATION;  Surgeon: Hessie Knows, MD;  Location: [facility] ORS;  Service: Orthopedics;  Laterality: N/A;  . KYPHOPLASTY Right 09/01/2019   Procedure: Right Sacral Radiofrequency Ablation and Cement Augmentation, Right sacrum and iliac crest;  Surgeon: Hessie Knows, MD;  Location: [facility] ORS;  Service:  Orthopedics;  Laterality: Right;  . PORTA CATH INSERTION N/A 08/28/2019   Procedure: PORTA CATH INSERTION;  Surgeon: Algernon Huxley, MD;  Location: [facility];  Service: Cardiovascular;  Laterality: N/A;    Social History   Socioeconomic History  . Marital status: Single    Spouse name: Not on file  . Number of children: Not on file  . Years of education: Not on file  . Highest education level: Not on file  Occupational History  . Not on file  Tobacco Use  . Smoking status: Never Smoker  . Smokeless tobacco: Never Used  Vaping Use  . Vaping Use: Never used  Substance and Sexual Activity  . Alcohol use: Not Currently  . Drug use: Not Currently  . Sexual activity: Not Currently    Birth control/protection: None  Other Topics Concern  . Not on file  Social History Narrative   Lives at home with children   Social Determinants of Health   Financial Resource Strain: Not on file  Food Insecurity: Not on file  Transportation Needs: Not on file  Physical Activity: Not on file  Stress: Not on file  Social Connections: Not on file  Intimate Partner Violence: Not on file    Family History  Problem Relation Age of Onset  . Colon cancer Maternal Uncle      Current Outpatient Medications:  .  FLUoxetine (PROZAC) 10 MG capsule, Take 1 capsule (10 mg total) by mouth daily., Disp: 30 capsule, Rfl: 1 .  gabapentin (NEURONTIN) 300 MG capsule, Take 1 capsule (300 mg total) by mouth  2 (two) times daily., Disp: 60 capsule, Rfl: 1 .  lidocaine-prilocaine (EMLA) cream, Apply 1 application topically as needed. Apply small amount to port site at least 1 hour prior to it being accessed, cover with plastic wrap, Disp: 30 g, Rfl: 1 .  traZODone (DESYREL) 100 MG tablet, Take 1 tablet (100 mg total) by mouth at bedtime., Disp: 30 tablet, Rfl: 2 .  diphenoxylate-atropine (LOMOTIL) 2.5-0.025 MG tablet, Take 1 tablet by mouth 4 (four) times daily as needed for diarrhea or loose stools.  (Patient not taking: No sig reported), Disp: 30 tablet, Rfl: 0 .  LORazepam (ATIVAN) 0.5 MG tablet, Take 1 tablet (0.5 mg total) by mouth every 8 (eight) hours. (Patient not taking: No sig reported), Disp: 30 tablet, Rfl: 0 .  oxycodone (OXY-IR) 5 MG capsule, Take 1-2 capsules (5-10 mg total) by mouth every 4 (four) hours as needed. (Patient not taking: No sig reported), Disp: 60 capsule, Rfl: 0 .  oxyCODONE (OXYCONTIN) 10 mg 12 hr tablet, Take 1 tablet (10 mg total) by mouth every 12 (twelve) hours., Disp: 60 tablet, Rfl: 0 No current facility-administered medications for this visit.  Facility-Administered Medications Ordered in Other Visits:  .  heparin lock flush 100 unit/mL, 500 Units, Intracatheter, Once PRN, Sindy Guadeloupe, MD  Physical exam:  Vitals:   03/05/20 0947  BP: 114/82  Pulse: 75  Resp: 18  Temp: 98.3 F (36.8 C)  TempSrc: Tympanic  SpO2: 100%  Weight: 129 lb 11.2 oz (58.8 kg)   Physical Exam Constitutional:      General: She is not in acute distress. Eyes:     Extraocular Movements: EOM normal.  Cardiovascular:     Rate and Rhythm: Normal rate and regular rhythm.     Heart sounds: Normal heart sounds.  Pulmonary:     Effort: Pulmonary effort is normal.     Breath sounds: Normal breath sounds.  Abdominal:     General: Bowel sounds are normal.     Palpations: Abdomen is soft.  Skin:    General: Skin is warm and dry.  Neurological:     Mental Status: She is alert and oriented to person, place, and time.      CMP Latest Ref Rng & Units 03/05/2020  Glucose 70 - 99 mg/dL 115(H)  BUN 6 - 20 mg/dL 8  Creatinine 0.44 - 1.00 mg/dL 0.39(L)  Sodium 135 - 145 mmol/L 138  Potassium 3.5 - 5.1 mmol/L 3.4(L)  Chloride 98 - 111 mmol/L 105  CO2 22 - 32 mmol/L 26  Calcium 8.9 - 10.3 mg/dL 8.5(L)  Total Protein 6.5 - 8.1 g/dL 6.9  Total Bilirubin 0.3 - 1.2 mg/dL 0.6  Alkaline Phos 38 - 126 U/L 56  AST 15 - 41 U/L 36  ALT 0 - 44 U/L 33   CBC Latest Ref Rng & Units  03/05/2020  WBC 4.0 - 10.5 K/uL 5.9  Hemoglobin 12.0 - 15.0 g/dL 11.3(L)  Hematocrit 36.0 - 46.0 % 34.1(L)  Platelets 150 - 400 K/uL 374    No images are attached to the encounter.  DG Thoracic Spine 2 View  Result Date: 02/10/2020 CLINICAL DATA:  Increased back pain EXAM: THORACIC SPINE 2 VIEWS COMPARISON:  None. FINDINGS: Prior vertebroplasty noted at L1, L4 and T6. No acute fracture. No focal bone lesion or subluxation. Right Port-A-Cath in place with the tip in the right atrium. Visualized lungs clear. IMPRESSION: No acute bony abnormality. Electronically Signed   By: Rolm Baptise M.D.  On: 02/10/2020 16:45     Assessment and plan- Patient is a 39 y.o. female  with metastatic ER negative her 2 positive breast cancer on taxol/herceptin and perjeta. She is here for on treatment assessment prior to cycle 9-day 15 of Taxol  Counts okay to proceed with cycle 9-day 15 of Taxol today.  She will directly proceed for cycle 10-day 1 of Taxol Herceptin and Perjeta next week and I will see her back in 2 weeks for cycle 10-day 8 of Taxol.  She will get Xgeva with cycle 10-day 15.  Plan to repeat scans after 10 cycles  Neoplasm related pain: Continue as needed OxyContin and oxycodone  On depression for Prozac  On trazodone for insomnia  We will obtain an echocardiogram in the next 2 weeks.  I will also refer her to genetics  visit Diagnosis 1. Metastatic breast cancer [facility])      Dr. Randa Evens, MD, MPH [facility] at [facility] [phone] 03/05/2020 12:52 PM

## 2020-03-05 NOTE — Progress Notes (Signed)
Pt tolerated infusion well. Pt stable at discharge. 

## 2020-03-11 ENCOUNTER — Other Ambulatory Visit: Payer: Self-pay

## 2020-03-11 ENCOUNTER — Inpatient Hospital Stay: Payer: Self-pay

## 2020-03-11 VITALS — BP 103/65 | HR 80 | Temp 96.8°F | Resp 18 | Wt 133.4 lb

## 2020-03-11 DIAGNOSIS — M8440XA Pathological fracture, unspecified site, initial encounter for fracture: Secondary | ICD-10-CM

## 2020-03-11 DIAGNOSIS — C50919 Malignant neoplasm of unspecified site of unspecified female breast: Secondary | ICD-10-CM

## 2020-03-11 LAB — CBC WITH DIFFERENTIAL/PLATELET
Abs Immature Granulocytes: 0.05 10*3/uL (ref 0.00–0.07)
Basophils Absolute: 0 10*3/uL (ref 0.0–0.1)
Basophils Relative: 1 %
Eosinophils Absolute: 0.1 10*3/uL (ref 0.0–0.5)
Eosinophils Relative: 2 %
HCT: 33.5 % — ABNORMAL LOW (ref 36.0–46.0)
Hemoglobin: 10.9 g/dL — ABNORMAL LOW (ref 12.0–15.0)
Immature Granulocytes: 1 %
Lymphocytes Relative: 25 %
Lymphs Abs: 1.1 10*3/uL (ref 0.7–4.0)
MCH: 28.4 pg (ref 26.0–34.0)
MCHC: 32.5 g/dL (ref 30.0–36.0)
MCV: 87.2 fL (ref 80.0–100.0)
Monocytes Absolute: 0.3 10*3/uL (ref 0.1–1.0)
Monocytes Relative: 6 %
Neutro Abs: 2.8 10*3/uL (ref 1.7–7.7)
Neutrophils Relative %: 65 %
Platelets: 396 10*3/uL (ref 150–400)
RBC: 3.84 MIL/uL — ABNORMAL LOW (ref 3.87–5.11)
RDW: 15.7 % — ABNORMAL HIGH (ref 11.5–15.5)
WBC: 4.3 10*3/uL (ref 4.0–10.5)
nRBC: 0 % (ref 0.0–0.2)

## 2020-03-11 LAB — COMPREHENSIVE METABOLIC PANEL
ALT: 23 U/L (ref 0–44)
AST: 34 U/L (ref 15–41)
Albumin: 3.6 g/dL (ref 3.5–5.0)
Alkaline Phosphatase: 55 U/L (ref 38–126)
Anion gap: 9 (ref 5–15)
BUN: 10 mg/dL (ref 6–20)
CO2: 24 mmol/L (ref 22–32)
Calcium: 8.6 mg/dL — ABNORMAL LOW (ref 8.9–10.3)
Chloride: 103 mmol/L (ref 98–111)
Creatinine, Ser: 0.51 mg/dL (ref 0.44–1.00)
GFR, Estimated: >60 mL/min (ref >60)
Glucose, Bld: 136 mg/dL — ABNORMAL HIGH (ref 70–99)
Potassium: 3.3 mmol/L — ABNORMAL LOW (ref 3.5–5.1)
Sodium: 136 mmol/L (ref 135–145)
Total Bilirubin: 0.6 mg/dL (ref 0.3–1.2)
Total Protein: 6.4 g/dL — ABNORMAL LOW (ref 6.5–8.1)

## 2020-03-11 MED ORDER — SODIUM CHLORIDE 0.9 % IV SOLN
20.0000 mg | Freq: Once | INTRAVENOUS | Status: AC
  Administered 2020-03-11: 20 mg via INTRAVENOUS
  Filled 2020-03-11: qty 20

## 2020-03-11 MED ORDER — SODIUM CHLORIDE 0.9 % IV SOLN
Freq: Once | INTRAVENOUS | Status: AC
  Filled 2020-03-11: qty 250

## 2020-03-11 MED ORDER — ACETAMINOPHEN 325 MG PO TABS
650.0000 mg | ORAL_TABLET | Freq: Once | ORAL | Status: AC
  Administered 2020-03-11: 650 mg via ORAL
  Filled 2020-03-11: qty 2

## 2020-03-11 MED ORDER — DIPHENHYDRAMINE HCL 50 MG/ML IJ SOLN
50.0000 mg | Freq: Once | INTRAMUSCULAR | Status: AC
  Administered 2020-03-11: 50 mg via INTRAVENOUS
  Filled 2020-03-11: qty 1

## 2020-03-11 MED ORDER — SODIUM CHLORIDE 0.9 % IV SOLN
420.0000 mg | Freq: Once | INTRAVENOUS | Status: AC
  Administered 2020-03-11: 420 mg via INTRAVENOUS
  Filled 2020-03-11: qty 14

## 2020-03-11 MED ORDER — HEPARIN SOD (PORK) LOCK FLUSH 100 UNIT/ML IV SOLN
500.0000 [IU] | Freq: Once | INTRAVENOUS | Status: AC
  Administered 2020-03-11: 500 [IU] via INTRAVENOUS
  Filled 2020-03-11: qty 5

## 2020-03-11 MED ORDER — SODIUM CHLORIDE 0.9 % IV SOLN
65.0000 mg/m2 | Freq: Once | INTRAVENOUS | Status: AC
  Administered 2020-03-11: 108 mg via INTRAVENOUS
  Filled 2020-03-11: qty 18

## 2020-03-11 MED ORDER — SODIUM CHLORIDE 0.9% FLUSH
10.0000 mL | Freq: Once | INTRAVENOUS | Status: AC
  Administered 2020-03-11: 10 mL via INTRAVENOUS
  Filled 2020-03-11: qty 10

## 2020-03-11 MED ORDER — FAMOTIDINE IN NACL 20-0.9 MG/50ML-% IV SOLN
20.0000 mg | Freq: Once | INTRAVENOUS | Status: AC
  Administered 2020-03-11: 20 mg via INTRAVENOUS
  Filled 2020-03-11: qty 50

## 2020-03-11 MED ORDER — HEPARIN SOD (PORK) LOCK FLUSH 100 UNIT/ML IV SOLN
INTRAVENOUS | Status: AC
  Filled 2020-03-11: qty 5

## 2020-03-11 MED ORDER — TRASTUZUMAB-ANNS CHEMO 150 MG IV SOLR
6.0000 mg/kg | Freq: Once | INTRAVENOUS | Status: AC
  Administered 2020-03-11: 357 mg via INTRAVENOUS
  Filled 2020-03-11: qty 17

## 2020-03-16 ENCOUNTER — Other Ambulatory Visit: Payer: Self-pay

## 2020-03-16 MED ORDER — FLUOXETINE HCL 10 MG PO CAPS: 10.0000 mg | ORAL_CAPSULE | Freq: Every day | ORAL | 1 refills | Status: DC

## 2020-03-23 ENCOUNTER — Other Ambulatory Visit: Payer: Self-pay

## 2020-03-23 ENCOUNTER — Other Ambulatory Visit: Payer: Self-pay | Admitting: *Deleted

## 2020-03-23 ENCOUNTER — Ambulatory Visit
Admission: RE | Admit: 2020-03-23 | Discharge: 2020-03-23 | Disposition: A | Discharge status: Home / Self Care | Payer: Self-pay | Source: Ambulatory Visit | Attending: Oncology | Admitting: Oncology

## 2020-03-23 DIAGNOSIS — F419 Anxiety disorder, unspecified: Secondary | ICD-10-CM | POA: Insufficient documentation

## 2020-03-23 DIAGNOSIS — C50919 Malignant neoplasm of unspecified site of unspecified female breast: Secondary | ICD-10-CM | POA: Insufficient documentation

## 2020-03-23 LAB — ECHOCARDIOGRAM COMPLETE
AR max vel: 2.22 cm2
AV Area VTI: 2.41 cm2
AV Area mean vel: 2.28 cm2
AV Mean grad: 5 mmHg
AV Peak grad: 9.7 mmHg
Ao pk vel: 1.56 m/s
Area-P 1/2: 3.56 cm2
S' Lateral: 2.71 cm

## 2020-03-23 NOTE — Progress Notes (Signed)
*  PRELIMINARY RESULTS* Echocardiogram 2D Echocardiogram has been performed.  Cristela Blue 03/23/2020, 11:11 AM

## 2020-03-23 NOTE — Progress Notes (Signed)
Pt called wanting refill, one was place 12/31 and there is a refill for it. Called pharmacy and it is ready to fill. Called pt and got her voicemail and left message that it has been refilled and has 1 renewal after she picks it up today

## 2020-03-24 ENCOUNTER — Inpatient Hospital Stay: Payer: Self-pay | Admitting: Hospice and Palliative Medicine

## 2020-03-26 ENCOUNTER — Inpatient Hospital Stay: Payer: Self-pay | Attending: Oncology | Admitting: Oncology

## 2020-03-26 ENCOUNTER — Other Ambulatory Visit: Payer: Self-pay

## 2020-03-26 ENCOUNTER — Encounter: Payer: Self-pay | Admitting: Oncology

## 2020-03-26 ENCOUNTER — Inpatient Hospital Stay: Payer: Self-pay

## 2020-03-26 VITALS — Resp 18

## 2020-03-26 VITALS — BP 114/61 | HR 82 | Temp 98.2°F | Ht 60.0 in | Wt 136.5 lb

## 2020-03-26 DIAGNOSIS — F419 Anxiety disorder, unspecified: Secondary | ICD-10-CM | POA: Insufficient documentation

## 2020-03-26 DIAGNOSIS — Z79899 Other long term (current) drug therapy: Secondary | ICD-10-CM | POA: Insufficient documentation

## 2020-03-26 DIAGNOSIS — Z5112 Encounter for antineoplastic immunotherapy: Secondary | ICD-10-CM | POA: Insufficient documentation

## 2020-03-26 DIAGNOSIS — C7951 Secondary malignant neoplasm of bone: Secondary | ICD-10-CM

## 2020-03-26 DIAGNOSIS — M25551 Pain in right hip: Secondary | ICD-10-CM | POA: Insufficient documentation

## 2020-03-26 DIAGNOSIS — G893 Neoplasm related pain (acute) (chronic): Secondary | ICD-10-CM | POA: Insufficient documentation

## 2020-03-26 DIAGNOSIS — C50919 Malignant neoplasm of unspecified site of unspecified female breast: Secondary | ICD-10-CM

## 2020-03-26 DIAGNOSIS — M8440XA Pathological fracture, unspecified site, initial encounter for fracture: Secondary | ICD-10-CM

## 2020-03-26 DIAGNOSIS — R5383 Other fatigue: Secondary | ICD-10-CM | POA: Insufficient documentation

## 2020-03-26 DIAGNOSIS — Z5111 Encounter for antineoplastic chemotherapy: Secondary | ICD-10-CM | POA: Insufficient documentation

## 2020-03-26 DIAGNOSIS — Z171 Estrogen receptor negative status [ER-]: Secondary | ICD-10-CM | POA: Insufficient documentation

## 2020-03-26 DIAGNOSIS — R5381 Other malaise: Secondary | ICD-10-CM | POA: Insufficient documentation

## 2020-03-26 DIAGNOSIS — C50912 Malignant neoplasm of unspecified site of left female breast: Secondary | ICD-10-CM | POA: Insufficient documentation

## 2020-03-26 LAB — COMPREHENSIVE METABOLIC PANEL
ALT: 36 U/L (ref 0–44)
AST: 38 U/L (ref 15–41)
Albumin: 3.5 g/dL (ref 3.5–5.0)
Alkaline Phosphatase: 68 U/L (ref 38–126)
Anion gap: 6 (ref 5–15)
BUN: 11 mg/dL (ref 6–20)
CO2: 26 mmol/L (ref 22–32)
Calcium: 8.7 mg/dL — ABNORMAL LOW (ref 8.9–10.3)
Chloride: 104 mmol/L (ref 98–111)
Creatinine, Ser: 0.48 mg/dL (ref 0.44–1.00)
GFR, Estimated: >60 mL/min (ref >60)
Glucose, Bld: 106 mg/dL — ABNORMAL HIGH (ref 70–99)
Potassium: 3.5 mmol/L (ref 3.5–5.1)
Sodium: 136 mmol/L (ref 135–145)
Total Bilirubin: 0.3 mg/dL (ref 0.3–1.2)
Total Protein: 6.7 g/dL (ref 6.5–8.1)

## 2020-03-26 LAB — CBC WITH DIFFERENTIAL/PLATELET
Abs Immature Granulocytes: 0.04 10*3/uL (ref 0.00–0.07)
Basophils Absolute: 0 10*3/uL (ref 0.0–0.1)
Basophils Relative: 1 %
Eosinophils Absolute: 0.1 10*3/uL (ref 0.0–0.5)
Eosinophils Relative: 2 %
HCT: 33.8 % — ABNORMAL LOW (ref 36.0–46.0)
Hemoglobin: 11.1 g/dL — ABNORMAL LOW (ref 12.0–15.0)
Immature Granulocytes: 1 %
Lymphocytes Relative: 22 %
Lymphs Abs: 1.3 10*3/uL (ref 0.7–4.0)
MCH: 28 pg (ref 26.0–34.0)
MCHC: 32.8 g/dL (ref 30.0–36.0)
MCV: 85.1 fL (ref 80.0–100.0)
Monocytes Absolute: 0.6 10*3/uL (ref 0.1–1.0)
Monocytes Relative: 10 %
Neutro Abs: 3.7 10*3/uL (ref 1.7–7.7)
Neutrophils Relative %: 64 %
Platelets: 351 10*3/uL (ref 150–400)
RBC: 3.97 MIL/uL (ref 3.87–5.11)
RDW: 14.9 % (ref 11.5–15.5)
WBC: 5.7 10*3/uL (ref 4.0–10.5)
nRBC: 0 % (ref 0.0–0.2)

## 2020-03-26 MED ORDER — HEPARIN SOD (PORK) LOCK FLUSH 100 UNIT/ML IV SOLN
500.0000 [IU] | Freq: Once | INTRAVENOUS | Status: AC
  Administered 2020-03-26: 500 [IU] via INTRAVENOUS
  Filled 2020-03-26: qty 5

## 2020-03-26 MED ORDER — HEPARIN SOD (PORK) LOCK FLUSH 100 UNIT/ML IV SOLN
INTRAVENOUS | Status: AC
  Filled 2020-03-26: qty 5

## 2020-03-26 MED ORDER — SODIUM CHLORIDE 0.9 % IV SOLN
Freq: Once | INTRAVENOUS | Status: AC
  Filled 2020-03-26: qty 250

## 2020-03-26 MED ORDER — DIPHENHYDRAMINE HCL 50 MG/ML IJ SOLN
50.0000 mg | Freq: Once | INTRAMUSCULAR | Status: AC
  Administered 2020-03-26: 50 mg via INTRAVENOUS
  Filled 2020-03-26: qty 1

## 2020-03-26 MED ORDER — DENOSUMAB 120 MG/1.7ML ~~LOC~~ SOLN
120.0000 mg | Freq: Once | SUBCUTANEOUS | Status: AC
  Administered 2020-03-26: 120 mg via SUBCUTANEOUS
  Filled 2020-03-26: qty 1.7

## 2020-03-26 MED ORDER — SODIUM CHLORIDE 0.9 % IV SOLN
65.0000 mg/m2 | Freq: Once | INTRAVENOUS | Status: AC
  Administered 2020-03-26: 108 mg via INTRAVENOUS
  Filled 2020-03-26: qty 18

## 2020-03-26 MED ORDER — SODIUM CHLORIDE 0.9 % IV SOLN
20.0000 mg | Freq: Once | INTRAVENOUS | Status: AC
  Administered 2020-03-26: 20 mg via INTRAVENOUS
  Filled 2020-03-26: qty 20

## 2020-03-26 MED ORDER — FAMOTIDINE IN NACL 20-0.9 MG/50ML-% IV SOLN
20.0000 mg | Freq: Once | INTRAVENOUS | Status: AC
  Administered 2020-03-26: 20 mg via INTRAVENOUS
  Filled 2020-03-26: qty 50

## 2020-03-26 MED ORDER — DOXYCYCLINE HYCLATE 100 MG PO TABS: 100.0000 mg | ORAL_TABLET | Freq: Two times a day (BID) | ORAL | 0 refills | Status: AC

## 2020-03-26 MED ORDER — SODIUM CHLORIDE 0.9% FLUSH
10.0000 mL | INTRAVENOUS | Status: DC | PRN
  Administered 2020-03-26: 10 mL via INTRAVENOUS
  Filled 2020-03-26: qty 10

## 2020-03-26 NOTE — Progress Notes (Signed)
Patient is complain of right hip and leg pain. She stated that both eye lids are swollen feels like something is in them

## 2020-03-26 NOTE — Progress Notes (Signed)
1130- Per MD, Dr. Janese Banks, order: administer Xgeva 120mg  subcutaneous once today; order is present in supportive therapy plan.  1327- Patient tolerated treatment well. Patient stable and discharged to home at this time.

## 2020-03-26 NOTE — Progress Notes (Signed)
Hematology/Oncology Consult note [facility]  Telephone:([phone] [phone]  Patient Care Team: [place], [place], NP as PCP - General Rico Junker, RN as Registered Nurse Theodore Demark, RN as Registered Nurse   Name of the patient: Sophia [name]  [id number]  12/11/1980   Date of visit: 03/26/20  Diagnosis- stage IV metastatic breast cancer ER/PR negative HER-2/neu positive with bone metastases  Chief complaint/ Reason for visit-on treatment assessment prior to cycle 10 day 15 of Taxol  Heme/Onc history: patient is a 40 year old Hispanic female who is here with her friend. History obtained with the help of an interpreter.Patient self palpated left breast mass which was followed by a diagnostic bilateral mammogram. Mammogram showed 3.1 x 2.9 x 1.9 cm hypoechoic mass at the 1 o'clock position of the left breast. For abnormal cortically thickened left axillary lymph nodes measuring up to 5 mm. 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 Patient was also having ongoing back pain and was seen by [facility] orthopedics Dr. Doyle Askew who ordered MRI lumbar spine without contrast which showed possible pathologic fractures of L1 and L4 vertebral bodies with greater than 50% height loss at L1 and abnormal signal involving L2-L3 S1 as well as right iliac bone concerning for metastatic disease.Patient is a single mother of 3 adult children and is very anxious today.She reports significant back pain which radiates to her bilateral thighs. Denies any focal tingling numbness or weakness. Denies any bowel bladder incontinence. Pain has been uncontrolled despite taking Tylenol. No prior history of abnormal breast biopsies. No family history of breast cancer  PET and MRIshowed 3 areas of pathologic fracture of her spine as well  as widespread bony metastatic disease and concern for impending fracture of the right hip. Given her worsening pain she was asked to come to the ER. She has been evaluated by Dr. Rudene Christians from orthopedic surgery and underwent kyphoplasty at 3 different levels.T6 L1 and L4 along with radiofrequency ablation.She also underwent prophylactic fixation of the right hip and not affected the sacral region.   Patient received first dose of Herceptin and Perjeta on 09/04/2019. Baseline echocardiogram normal.she is currently getting taxol/ herceptin/perjeta  Patient admitted to hospital for acute abdominal pain with CT findings concerning for acute colitis. Colonoscopy showed diffuse severe inflammation with erythema friability and loss of vascularity and shallow ulcerations in the IC valve, ascending colon and cecum.   Interval history-reports ongoing right hip pain which radiates to her right leg.  Reports swelling around the lateral aspect of her right hip.  ECOG PS- 1 Pain scale- 4 Opioid associated constipation- no  Review of systems- Review of Systems  Constitutional: Positive for malaise/fatigue. Negative for chills, fever and weight loss.  HENT: Negative for congestion, ear discharge and nosebleeds.   Eyes: Negative for blurred vision.  Respiratory: Negative for cough, hemoptysis, sputum production, shortness of breath and wheezing.   Cardiovascular: Negative for chest pain, palpitations, orthopnea and claudication.  Gastrointestinal: Negative for abdominal pain, blood in stool, constipation, diarrhea, heartburn, melena, nausea and vomiting.  Genitourinary: Negative for dysuria, flank pain, frequency, hematuria and urgency.  Musculoskeletal: Negative for back pain, joint pain and myalgias.       Right hip pain  Skin: Negative for rash.  Neurological: Negative for dizziness, tingling, focal weakness, seizures, weakness and headaches.  Endo/Heme/Allergies: Does not bruise/bleed  easily.   Psychiatric/Behavioral: Negative for depression and suicidal ideas. The patient does not have insomnia.        No Known Allergies   Past Medical History:  Diagnosis Date  . Anxiety   . Breast cancer (HCC)    with mets  . Cancer (HCC)   . Colitis   . Vertigo      Past Surgical History:  Procedure Laterality Date  . BREAST BIOPSY Left 08/14/2019   [place] bx of mass, coil marker, path pending  . BREAST BIOPSY Left 08/14/2019   [place] bx of LN, hydromarker, path pending  . BREAST BIOPSY Left 08/14/2019   affirm bx of calcs, x marker, path pending  . ESOPHAGOGASTRODUODENOSCOPY (EGD) WITH PROPOFOL N/A 10/05/2019   Procedure: ESOPHAGOGASTRODUODENOSCOPY (EGD) WITH PROPOFOL;  Surgeon: Toney Reil, MD;  Location: [facility] ENDOSCOPY;  Service: Gastroenterology;  Laterality: N/A;  . FLEXIBLE SIGMOIDOSCOPY N/A 10/05/2019   Procedure: FLEXIBLE SIGMOIDOSCOPY;  Surgeon: Toney Reil, MD;  Location: [facility] ENDOSCOPY;  Service: Gastroenterology;  Laterality: N/A;  . INTRAMEDULLARY (IM) NAIL INTERTROCHANTERIC Right 09/01/2019   Procedure: INTRAMEDULLARY (IM) NAIL INTERTROCHANTRIC AND RADIOFREQUENCY ABLATION;  Surgeon: Kennedy Bucker, MD;  Location: [facility] ORS;  Service: Orthopedics;  Laterality: Right;  . KYPHOPLASTY N/A 08/29/2019   Procedure: KYPHOPLASTY T6, L1,L4 ,  RADIOFREQUENCY ABLATION;  Surgeon: Kennedy Bucker, MD;  Location: [facility] ORS;  Service: Orthopedics;  Laterality: N/A;  . KYPHOPLASTY Right 09/01/2019   Procedure: Right Sacral Radiofrequency Ablation and Cement Augmentation, Right sacrum and iliac crest;  Surgeon: Kennedy Bucker, MD;  Location: [facility] ORS;  Service: Orthopedics;  Laterality: Right;  . PORTA CATH INSERTION N/A 08/28/2019   Procedure: PORTA CATH INSERTION;  Surgeon: Annice Needy, MD;  Location: [facility] INVASIVE CV LAB;  Service: Cardiovascular;  Laterality: N/A;    Social History   Socioeconomic History  . Marital status: Single    Spouse name: Not on file  . Number of  children: Not on file  . Years of education: Not on file  . Highest education level: Not on file  Occupational History  . Not on file  Tobacco Use  . Smoking status: Never Smoker  . Smokeless tobacco: Never Used  Vaping Use  . Vaping Use: Never used  Substance and Sexual Activity  . Alcohol use: Not Currently  . Drug use: Not Currently  . Sexual activity: Not Currently    Birth control/protection: None  Other Topics Concern  . Not on file  Social History Narrative   Lives at home with children   Social Determinants of Health   Financial Resource Strain: Not on file  Food Insecurity: Not on file  Transportation Needs: Not on file  Physical Activity: Not on file  Stress: Not on file  Social Connections: Not on file  Intimate Partner Violence: Not on file    Family History  Problem Relation Age of Onset  . Colon cancer Maternal Uncle      Current Outpatient Medications:  .  diphenoxylate-atropine (LOMOTIL) 2.5-0.025 MG tablet, Take 1 tablet by mouth 4 (four) times daily as needed for diarrhea or loose stools., Disp: 30 tablet, Rfl: 0 .  doxycycline (VIBRA-TABS) 100 MG tablet, Take 1 tablet (100 mg total) by mouth 2 (two) times daily for 7 days., Disp: 14 tablet, Rfl: 0 .  FLUoxetine (PROZAC) 10 MG capsule, Take 1 capsule (10 mg total) by mouth daily., Disp: 30 capsule, Rfl: 1 .  gabapentin (NEURONTIN) 300 MG capsule, Take 1 capsule (300 mg total) by  mouth 2 (two) times daily., Disp: 60 capsule, Rfl: 1 .  lidocaine-prilocaine (EMLA) cream, Apply 1 application topically as needed. Apply small amount to port site at least 1 hour prior to it being accessed, cover with plastic wrap, Disp: 30 g, Rfl: 1 .  LORazepam (ATIVAN) 0.5 MG tablet, Take 1 tablet (0.5 mg total) by mouth every 8 (eight) hours., Disp: 30 tablet, Rfl: 0 .  oxycodone (OXY-IR) 5 MG capsule, Take 1-2 capsules (5-10 mg total) by mouth every 4 (four) hours as needed., Disp: 60 capsule, Rfl: 0 .  oxyCODONE  (OXYCONTIN) 10 mg 12 hr tablet, Take 1 tablet (10 mg total) by mouth every 12 (twelve) hours., Disp: 60 tablet, Rfl: 0 .  traZODone (DESYREL) 100 MG tablet, Take 1 tablet (100 mg total) by mouth at bedtime., Disp: 30 tablet, Rfl: 2 No current facility-administered medications for this visit.  Facility-Administered Medications Ordered in Other Visits:  .  heparin lock flush 100 unit/mL, 500 Units, Intravenous, Once, Sindy Guadeloupe, MD .  PACLitaxel (TAXOL) 108 mg in sodium chloride 0.9 % 250 mL chemo infusion (</= $RemoveBefor'80mg'xnClKvoXHvac$ /m2), 65 mg/m2 (Treatment Plan Recorded), Intravenous, Once, Sindy Guadeloupe, MD .  sodium chloride flush (NS) 0.9 % injection 10 mL, 10 mL, Intravenous, PRN, Sindy Guadeloupe, MD, 10 mL at 03/26/20 1517  Physical exam:  Vitals:   03/26/20 0913  BP: 114/61  Pulse: 82  Temp: 98.2 F (36.8 C)  Weight: 136 lb 8 oz (61.9 kg)  Height: 5' (1.524 m)   Physical Exam Constitutional:      General: She is not in acute distress. Eyes:     Extraocular Movements: EOM normal.  Cardiovascular:     Rate and Rhythm: Normal rate and regular rhythm.  Pulmonary:     Effort: Pulmonary effort is normal.  Musculoskeletal:     Comments: Right thigh is more swollen than the left but this has been chronic since her right hip surgery  Skin:    General: Skin is warm and dry.  Neurological:     Mental Status: She is alert and oriented to person, place, and time.      CMP Latest Ref Rng & Units 03/26/2020  Glucose 70 - 99 mg/dL 106(H)  BUN 6 - 20 mg/dL 11  Creatinine 0.44 - 1.00 mg/dL 0.48  Sodium 135 - 145 mmol/L 136  Potassium 3.5 - 5.1 mmol/L 3.5  Chloride 98 - 111 mmol/L 104  CO2 22 - 32 mmol/L 26  Calcium 8.9 - 10.3 mg/dL 8.7(L)  Total Protein 6.5 - 8.1 g/dL 6.7  Total Bilirubin 0.3 - 1.2 mg/dL 0.3  Alkaline Phos 38 - 126 U/L 68  AST 15 - 41 U/L 38  ALT 0 - 44 U/L 36   CBC Latest Ref Rng & Units 03/26/2020  WBC 4.0 - 10.5 K/uL 5.7  Hemoglobin 12.0 - 15.0 g/dL 11.1(L)  Hematocrit  36.0 - 46.0 % 33.8(L)  Platelets 150 - 400 K/uL 351    No images are attached to the encounter.  ECHOCARDIOGRAM COMPLETE  Result Date: 03/23/2020    ECHOCARDIOGRAM REPORT   Patient Name:   ALONNIE BIEKER [facility] Date of Exam: 03/23/2020 Medical Rec #:  [id number]             Height:       60.0 in Accession #:    [id number]            Weight:       133.4 lb Date of Birth:  January 29, 1981             BSA:          1.571 m Patient Age:    39 years              BP:           103/65 mmHg Patient Gender: F                     HR:           80 bpm. Exam Location:  [facility] Procedure: 2D Echo, Cardiac Doppler, Color Doppler, Strain Analysis and 3D Echo Indications:     Chemo Z09  History:         Patient has prior history of Echocardiogram examinations, most                  recent 12/02/2019. Anxiety, breast cancer,, chemo patient.  Sonographer:     Sherrie Sport RDCS (AE) Referring Phys:  [id number] Weston Anna Markesia Crilly Diagnosing Phys: Serafina Royals MD  Sonographer Comments: Global longitudinal strain was attempted. IMPRESSIONS  1. Left ventricular ejection fraction, by estimation, is 60 to 65%. The left ventricle has normal function. The left ventricle has no regional wall motion abnormalities. Left ventricular diastolic parameters were normal.  2. Right ventricular systolic function is normal. The right ventricular size is normal.  3. The mitral valve is normal in structure. No evidence of mitral valve regurgitation.  4. The aortic valve is normal in structure. Aortic valve regurgitation is not visualized. FINDINGS  Left Ventricle: Left ventricular ejection fraction, by estimation, is 60 to 65%. The left ventricle has normal function. The left ventricle has no regional wall motion abnormalities. The left ventricular internal cavity size was normal in size. There is  no left ventricular hypertrophy. Left ventricular diastolic parameters were normal. Right Ventricle: The right ventricular size is normal. No increase in right ventricular  wall thickness. Right ventricular systolic function is normal. Left Atrium: Left atrial size was normal in size. Right Atrium: Right atrial size was normal in size. Pericardium: There is no evidence of pericardial effusion. Mitral Valve: The mitral valve is normal in structure. No evidence of mitral valve regurgitation. Tricuspid Valve: The tricuspid valve is normal in structure. Tricuspid valve regurgitation is trivial. Aortic Valve: The aortic valve is normal in structure. Aortic valve regurgitation is not visualized. Aortic valve mean gradient measures 5.0 mmHg. Aortic valve peak gradient measures 9.7 mmHg. Aortic valve area, by VTI measures 2.41 cm. Pulmonic Valve: The pulmonic valve was normal in structure. Pulmonic valve regurgitation is not visualized. Aorta: The aortic root and ascending aorta are structurally normal, with no evidence of dilitation. IAS/Shunts: No atrial level shunt detected by color flow Doppler.  LEFT VENTRICLE PLAX 2D LVIDd:         4.50 cm  Diastology LVIDs:         2.71 cm  LV e' medial:    7.94 cm/s LV PW:         0.63 cm  LV E/e' medial:  11.6 LV IVS:        0.72 cm  LV e' lateral:   14.70 cm/s LVOT diam:     2.00 cm  LV E/e' lateral: 6.3 LV SV:         68 LV SV Index:   43 LVOT Area:     3.14 cm  3D Volume EF:                         3D EF:        67 %                         LV EDV:       151 ml                         LV ESV:       49 ml                         LV SV:        102 ml RIGHT VENTRICLE RV Basal diam:  2.73 cm RV S prime:     13.60 cm/s TAPSE (M-mode): 4.3 cm LEFT ATRIUM             Index       RIGHT ATRIUM           Index LA diam:        3.40 cm 2.16 cm/m  RA Area:     17.50 cm LA Vol (A2C):   58.4 ml 37.17 ml/m RA Volume:   45.60 ml  29.02 ml/m LA Vol (A4C):   43.1 ml 27.43 ml/m LA Biplane Vol: 51.9 ml 33.03 ml/m  AORTIC VALVE                    PULMONIC VALVE AV Area (Vmax):    2.22 cm     PV Vmax:        1.05 m/s AV Area (Vmean):    2.28 cm     PV Peak grad:   4.4 mmHg AV Area (VTI):     2.41 cm     RVOT Peak grad: 8 mmHg AV Vmax:           156.00 cm/s AV Vmean:          108.000 cm/s AV VTI:            0.283 m AV Peak Grad:      9.7 mmHg AV Mean Grad:      5.0 mmHg LVOT Vmax:         110.00 cm/s LVOT Vmean:        78.300 cm/s LVOT VTI:          0.217 m LVOT/AV VTI ratio: 0.77  AORTA Ao Root diam: 2.60 cm MITRAL VALVE               TRICUSPID VALVE MV Area (PHT): 3.56 cm    TR Peak grad:   12.7 mmHg MV Decel Time: 213 msec    TR Vmax:        178.00 cm/s MV E velocity: 92.50 cm/s MV A velocity: 81.80 cm/s  SHUNTS MV E/A ratio:  1.13        Systemic VTI:  0.22 m                            Systemic Diam: 2.00 cm Serafina Royals MD Electronically signed by Serafina Royals MD Signature Date/Time: 03/23/2020/5:00:55 PM    Final      Assessment and plan- Patient is a 40 y.o. female   with metastatic ER negative her 2 positive breast cancer on  taxol/herceptin and perjeta.  She is here for on treatment assessment prior to cycle 10-day 15 of Taxol  Patient had to skip 1 week of treatment due to holidays and therefore she will be getting cycle 10-day 15 of Taxol today.  She will proceed with cycle 11-day 1 of Taxol Herceptin and Perjeta in 1 week and I will see her back in 2 weeks for cycle 11-day 8 of Taxol  Plan to get repeat CT chest abdomen and pelvis with contrast and bone scan in 1 week.  I will assess her ongoing right hip pain associated with those scans.  I suspect this is chronic secondary to underlying malignancy followed by surgery and radiation  Referral to genetics  Continue trazodone for insomnia  Neoplasm related pain: Continue as needed OxyContin and oxycodone   Visit Diagnosis 1. Bone metastases ([place])   2. Metastatic breast cancer ([place])   3. Encounter for antineoplastic chemotherapy   4. Encounter for monoclonal antibody treatment for malignancy   5. Neoplasm related pain      Dr. Randa Evens, MD, MPH [facility] at  [facility] [phone] 03/26/2020 11:16 AM

## 2020-03-27 LAB — CANCER ANTIGEN 27.29: CA 27.29: 6.8 U/mL (ref 0.0–38.6)

## 2020-03-30 ENCOUNTER — Telehealth: Payer: Self-pay | Admitting: *Deleted

## 2020-03-30 ENCOUNTER — Other Ambulatory Visit: Payer: Self-pay | Admitting: Nurse Practitioner

## 2020-03-30 ENCOUNTER — Ambulatory Visit: Payer: Self-pay

## 2020-03-30 MED ORDER — NYSTATIN 100000 UNIT/GM EX POWD: 1.0000 "application " | Freq: Four times a day (QID) | CUTANEOUS | 0 refills | Status: DC

## 2020-03-30 NOTE — Telephone Encounter (Signed)
Maritza called back and I explained to her that patient is to stop the Neosporin and that prescription for a powder has been sent to pharmacy. She states she will call patient and advise her of this

## 2020-03-30 NOTE — Telephone Encounter (Signed)
Sounds fungal. Can you send prescription for clotimazole powder?

## 2020-03-30 NOTE — Telephone Encounter (Signed)
Call from interpreter stating taht patient called reporting that she has a rash between her legs for which she is using neosporin on and is asking if that is alright or if she needs something else for it. Please advise

## 2020-03-30 NOTE — Telephone Encounter (Signed)
Prescription sent to pharmacy. Please discontinue neosporin. Keep area clean, dry, and apply topical powder as directed.

## 2020-03-30 NOTE — Telephone Encounter (Signed)
Call returned to [facility] as requested , but had to leave message on voice mail to call me back

## 2020-03-31 ENCOUNTER — Encounter: Payer: Self-pay | Admitting: Licensed Clinical Social Worker

## 2020-03-31 ENCOUNTER — Inpatient Hospital Stay (HOSPITAL_BASED_OUTPATIENT_CLINIC_OR_DEPARTMENT_OTHER): Payer: Self-pay | Admitting: Licensed Clinical Social Worker

## 2020-03-31 ENCOUNTER — Inpatient Hospital Stay: Payer: Self-pay

## 2020-03-31 DIAGNOSIS — Z800 Family history of malignant neoplasm of digestive organs: Secondary | ICD-10-CM

## 2020-03-31 DIAGNOSIS — C50919 Malignant neoplasm of unspecified site of unspecified female breast: Secondary | ICD-10-CM

## 2020-03-31 NOTE — Progress Notes (Signed)
REFERRING PROVIDER: Sindy Guadeloupe, MD [place],  [place] [address]  PRIMARY PROVIDER:  [facility] [place], NP  PRIMARY REASON FOR VISIT:  1. Metastatic breast cancer ([place])   2. Family history of colon cancer      HISTORY OF PRESENT ILLNESS:   Ms. Makeya Hilgert, a 40 y.o. female, was seen for a [facility] cancer genetics consultation at the request of Dr. Janese Banks due to a personal and family history of cancer.  Ms. Chelbi Herber presents to clinic today to discuss the possibility of a hereditary predisposition to cancer, genetic testing, and to further clarify her future cancer risks, as well as potential cancer risks for family members.   In 2021, at the age of 54, Ms. Sigrid Schwebach was diagnosed with metastatic breast cancer. This is being treated with chemotherapy.     CANCER HISTORY:  Oncology History  Metastatic breast cancer ([place])  08/29/2019 Initial Diagnosis   Metastatic breast cancer ([place])   09/04/2019 -  Chemotherapy    Patient is on Treatment Plan: BREAST WEEKLY PACLITAXEL + TRASTUZUMAB + PERTUZUMAB Q21D         RISK FACTORS:  Menarche was at age 15.  First live birth at age 38.  OCP use for approximately 0 years.  Ovaries intact: yes.  Hysterectomy: yes.  Colonoscopy: yes; normal. Mammogram within the last year: yes. Number of breast biopsies: 1.   Past Medical History:  Diagnosis Date  . Anxiety   . Breast cancer ([place])    with mets  . Cancer ([place])   . Colitis   . Family history of colon cancer   . Vertigo     Past Surgical History:  Procedure Laterality Date  . BREAST BIOPSY Left 08/14/2019   [place] bx of mass, coil marker, path pending  . BREAST BIOPSY Left 08/14/2019   [place] bx of LN, hydromarker, path pending  . BREAST BIOPSY Left 08/14/2019   affirm bx of calcs, x marker, path pending  . ESOPHAGOGASTRODUODENOSCOPY (EGD) WITH PROPOFOL N/A 10/05/2019   Procedure: ESOPHAGOGASTRODUODENOSCOPY (EGD) WITH PROPOFOL;  Surgeon:  Lin Landsman, MD;  Location: [facility];  Service: Gastroenterology;  Laterality: N/A;  . FLEXIBLE SIGMOIDOSCOPY N/A 10/05/2019   Procedure: FLEXIBLE SIGMOIDOSCOPY;  Surgeon: Lin Landsman, MD;  Location: [facility] ENDOSCOPY;  Service: Gastroenterology;  Laterality: N/A;  . INTRAMEDULLARY (IM) NAIL INTERTROCHANTERIC Right 09/01/2019   Procedure: INTRAMEDULLARY (IM) NAIL INTERTROCHANTRIC AND RADIOFREQUENCY ABLATION;  Surgeon: Hessie Knows, MD;  Location: [facility] ORS;  Service: Orthopedics;  Laterality: Right;  . KYPHOPLASTY N/A 08/29/2019   Procedure: KYPHOPLASTY T6, L1,L4 ,  RADIOFREQUENCY ABLATION;  Surgeon: Hessie Knows, MD;  Location: [facility] ORS;  Service: Orthopedics;  Laterality: N/A;  . KYPHOPLASTY Right 09/01/2019   Procedure: Right Sacral Radiofrequency Ablation and Cement Augmentation, Right sacrum and iliac crest;  Surgeon: Hessie Knows, MD;  Location: [facility];  Service: Orthopedics;  Laterality: Right;  . PORTA CATH INSERTION N/A 08/28/2019   Procedure: PORTA CATH INSERTION;  Surgeon: Algernon Huxley, MD;  Location: [facility];  Service: Cardiovascular;  Laterality: N/A;    Social History   Socioeconomic History  . Marital status: Single    Spouse name: Not on file  . Number of children: Not on file  . Years of education: Not on file  . Highest education level: Not on file  Occupational History  . Not on file  Tobacco Use  . Smoking status: Never Smoker  . Smokeless tobacco: Never Used  Vaping Use  . Vaping Use: Never used  Substance and Sexual Activity  . Alcohol use: Not Currently  . Drug use: Not Currently  . Sexual activity: Not Currently    Birth control/protection: None  Other Topics Concern  . Not on file  Social History Narrative   Lives at home with children   Social Determinants of Health   Financial Resource Strain: Not on file  Food Insecurity: Not on file  Transportation Needs: Not on file  Physical Activity: Not on file  Stress: Not on  file  Social Connections: Not on file     FAMILY HISTORY:  We obtained a detailed, 4-generation family history.  Significant diagnoses are listed below: Family History  Problem Relation Age of Onset  . Colon cancer Maternal Uncle    Ms. Birdena Kingma has 3 sons, ages 56, 60 and 32. She has 2 maternal half sisters and 1 paternal half brother, no cancers. The only known cancer in the family is her maternal uncle who was diagnosed with colon cancer in his 53s. She does not have information about her paternal family history.   Ms. Gicela Schwarting is unaware of previous family history of genetic testing for hereditary cancer risks.  There is no reported Ashkenazi Jewish ancestry. There is no known consanguinity.    GENETIC COUNSELING ASSESSMENT: Ms. Donnalyn Juran is a 40 y.o. female with a personal history of breast cancer which is somewhat suggestive of a hereditary cancer syndrome and predisposition to cancer. We, therefore, discussed and recommended the following at today's visit.   DISCUSSION: We discussed that approximately 5-10% of breast cancer is hereditary  Most cases of hereditary breast cancer are associated with BRCA1/BRCA2 genes, although there are other genes associated with hereditary cancer as well. We discussed that testing is beneficial for several reasons including  knowing about other cancer risks, identifying potential screening and risk-reduction options that may be appropriate, and to understand if other family members could be at risk for cancer and allow them to undergo genetic testing.   We reviewed the characteristics, features and inheritance patterns of hereditary cancer syndromes. We also discussed genetic testing, including the appropriate family members to test, the process of testing, insurance coverage and turn-around-time for results. We discussed the implications of a negative, positive and/or variant of uncertain significant result. We recommended Ms. Townsend Roger pursue genetic testing for the [facility] CancerNext-Expanded+RNA gene panel.   The CancerNext-Expanded + RNAinsight gene panel offered by [employer] and includes sequencing and rearrangement analysis for the following 77 genes: IP, ALK, APC*, ATM*, AXIN2, BAP1, BARD1, BLM, BMPR1A, BRCA1*, BRCA2*, BRIP1*, CDC73, CDH1*,CDK4, CDKN1B, CDKN2A, CHEK2*, CTNNA1, DICER1, FANCC, FH, FLCN, GALNT12, KIF1B, LZTR1, MAX, MEN1, MET, MLH1*, MSH2*, MSH3, MSH6*, MUTYH*, NBN, NF1*, NF2, NTHL1, PALB2*, PHOX2B, PMS2*, POT1, PRKAR1A, PTCH1, PTEN*, RAD51C*, RAD51D*,RB1, RECQL, RET, SDHA, SDHAF2, SDHB, SDHC, SDHD, SMAD4, SMARCA4, SMARCB1, SMARCE1, STK11, SUFU, TMEM127, TP53*,TSC1, TSC2, VHL and XRCC2 (sequencing and deletion/duplication); EGFR, EGLN1, HOXB13, KIT, MITF, PDGFRA, POLD1 and POLE (sequencing only); EPCAM and GREM1 (deletion/duplication only). DNA and RNA analyses performed for * genes.  Based on Ms. Romilda Garret [name]'s personal history of cancer, she meets medical criteria for genetic testing.  PLAN: After considering the risks, benefits, and limitations, Ms. Maylene Crocker provided informed consent to pursue genetic testing and the blood sample was sent to [facility] for analysis of the CancerNext-Expanded+RNA panel. Results should be available within approximately 2-3 weeks' time, at which point they will be disclosed by telephone to Ms.  Townsend Roger, as will any additional recommendations warranted by these results. Ms. Dauna Ziska will receive a summary of her genetic counseling visit and a copy of her results once available. This information will also be available in Epic.   Ms. Deeanne Deininger questions were answered to her satisfaction today. Our contact information was provided should additional questions or concerns arise. Thank you for the referral and allowing [place] to share in the care of your patient.   Faith Rogue, MS, [facility] Genetic Counselor [email or web address] Phone:  [phone]  The patient was seen for a total of 40 minutes in face-to-face genetic counseling. Spanish Interpreter was present and assisted with this session. Dr. Grayland Ormond was available for discussion regarding this case.   _______________________________________________________________________ For Office Staff:  Number of people involved in session: 2 Was an Intern/ student involved with case: no

## 2020-04-01 ENCOUNTER — Encounter
Admission: RE | Admit: 2020-04-01 | Discharge: 2020-04-01 | Disposition: A | Discharge status: Home / Self Care | Payer: Self-pay | Source: Ambulatory Visit | Attending: Oncology | Admitting: Oncology

## 2020-04-01 ENCOUNTER — Other Ambulatory Visit: Payer: Self-pay

## 2020-04-01 ENCOUNTER — Ambulatory Visit
Admission: RE | Admit: 2020-04-01 | Discharge: 2020-04-01 | Disposition: A | Discharge status: Home / Self Care | Payer: Self-pay | Source: Ambulatory Visit | Attending: Oncology | Admitting: Oncology

## 2020-04-01 DIAGNOSIS — C50919 Malignant neoplasm of unspecified site of unspecified female breast: Secondary | ICD-10-CM

## 2020-04-01 DIAGNOSIS — C7951 Secondary malignant neoplasm of bone: Secondary | ICD-10-CM

## 2020-04-01 IMAGING — CT CT CHEST-ABD-PELV W/ CM
3 of 5 series · 14 of 36 positions shown, 16 images · IV contrast (omnipaque)
Comparison: PET-CT [DATE]

CLINICAL DATA: Metastatic breast cancer.  Restaging.

EXAM:
CT CHEST, ABDOMEN, AND PELVIS WITH CONTRAST
TECHNIQUE: Multidetector CT imaging of the chest, abdomen and pelvis was
performed following the standard protocol during bolus
administration of intravenous contrast.
CONTRAST:  100mL OMNIPAQUE IOHEXOL 300 MG/ML  SOLN

[Series 2: cap with · axial · 0.75mm/px · z∈[-532,-92]mm · 9 of 112 slices shown, 11 images]
[im 12/112  mediastinal]
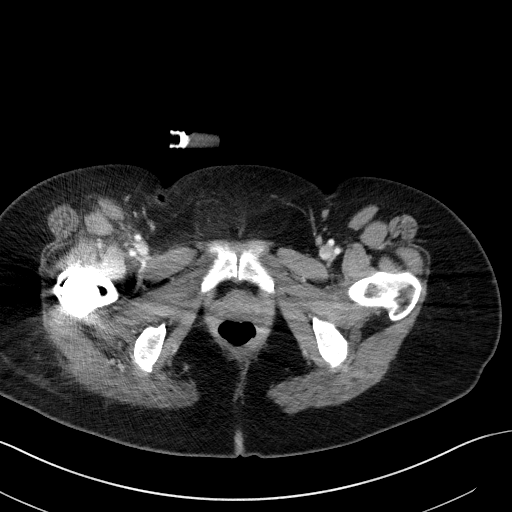
[im 12/112  bone]
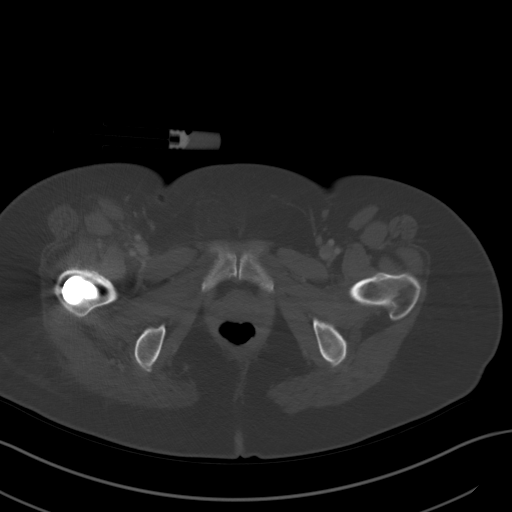
[im 23/112  mediastinal]
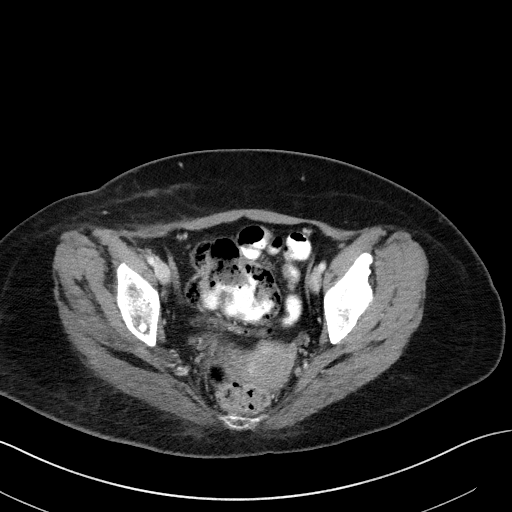
[im 34/112  mediastinal]
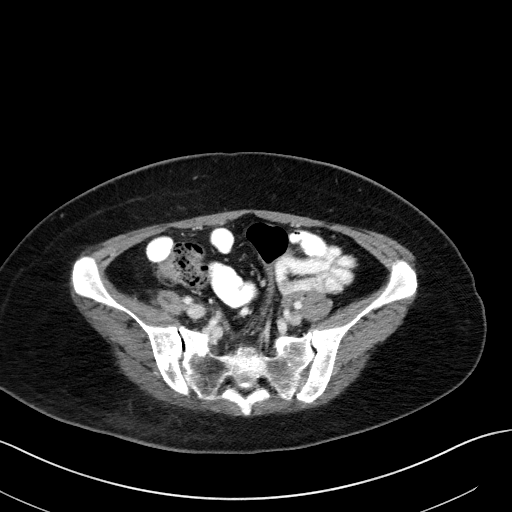
[im 45/112  mediastinal]
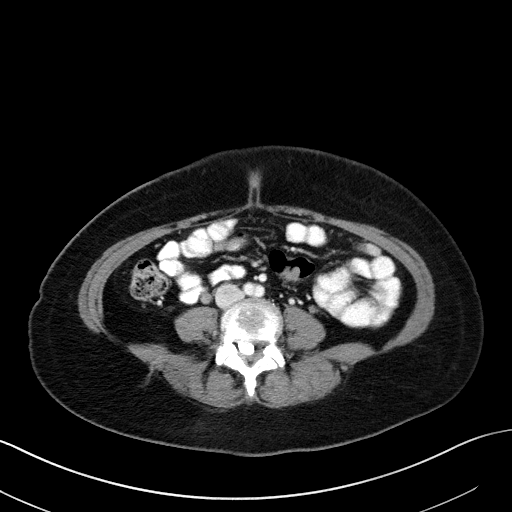
[im 56/112  mediastinal]
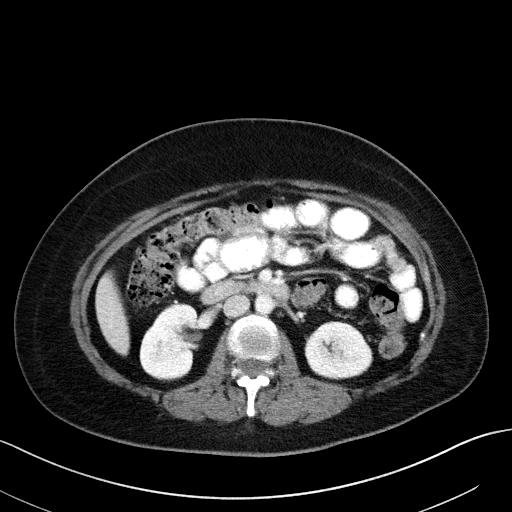
[im 67/112  mediastinal]
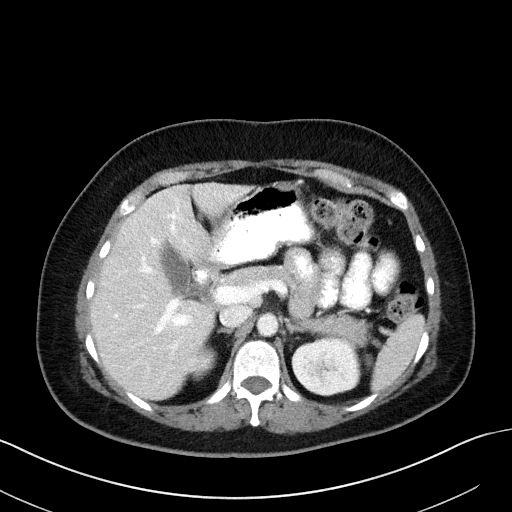
[im 78/112  mediastinal]
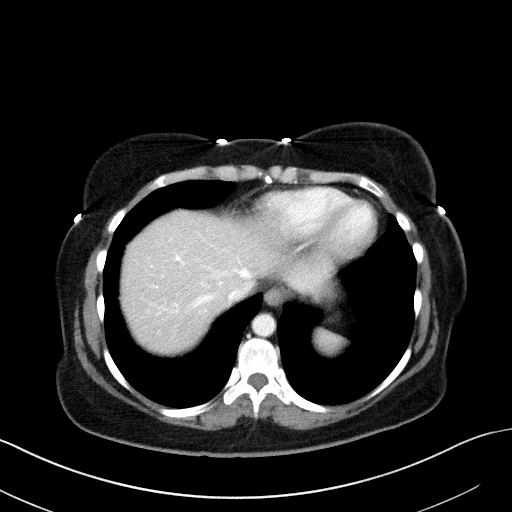
[im 89/112  mediastinal]
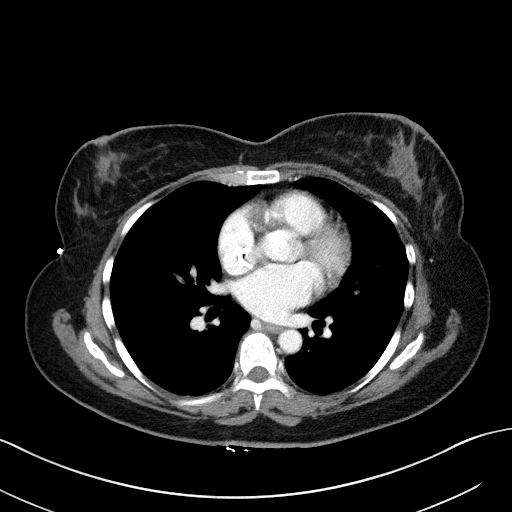
[im 100/112  mediastinal]
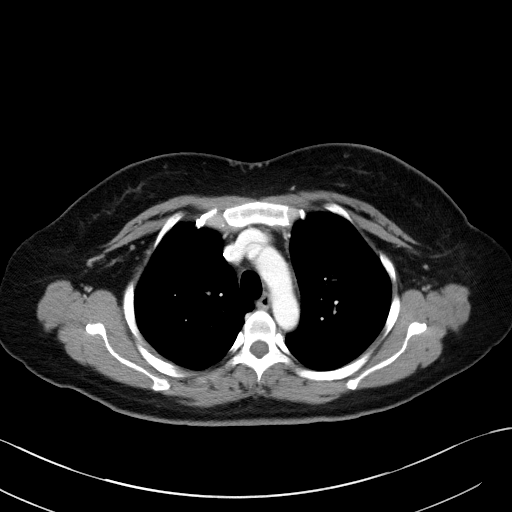
[im 100/112  bone]
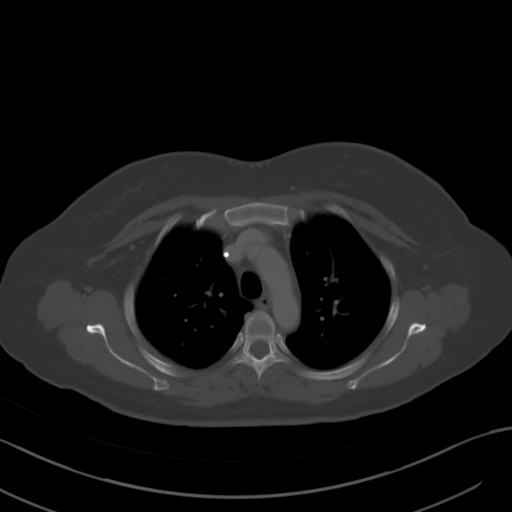

[Series 4: lung · axial · 0.75mm/px · z∈[-258,-218]mm · 2 of 124 slices shown]
[im 11/124  bone]
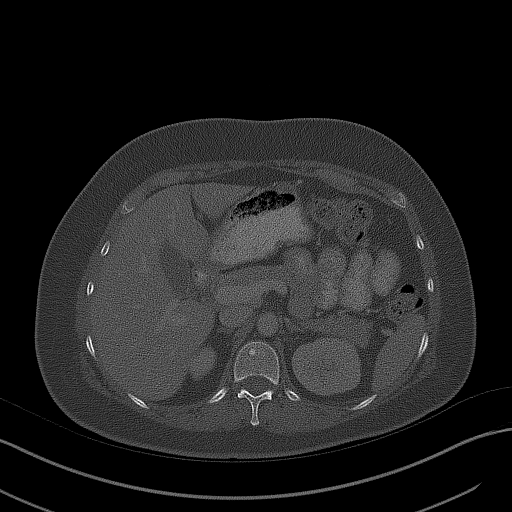
[im 31/124  bone]
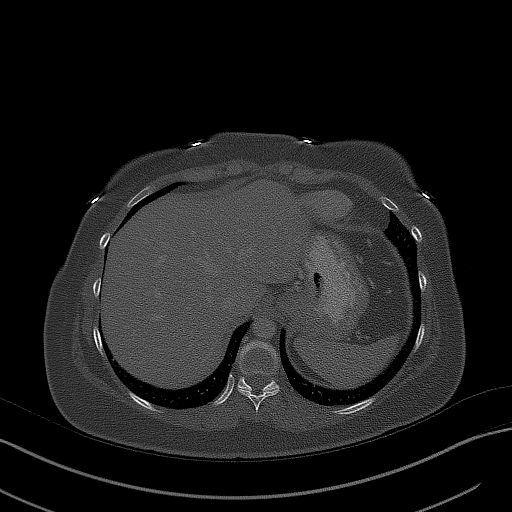

[Series 5: coronals · coronal · 0.77mm/px · 3 of 130 slices shown]
[im 26/130  mediastinal]
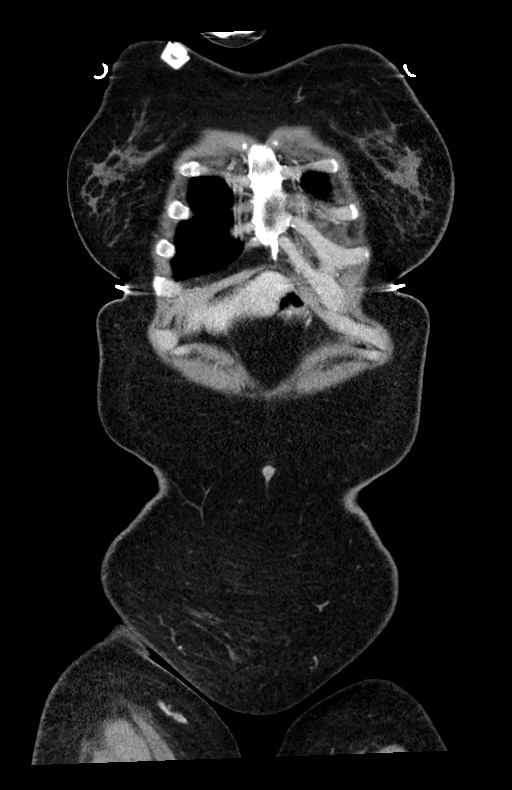
[im 52/130  mediastinal]
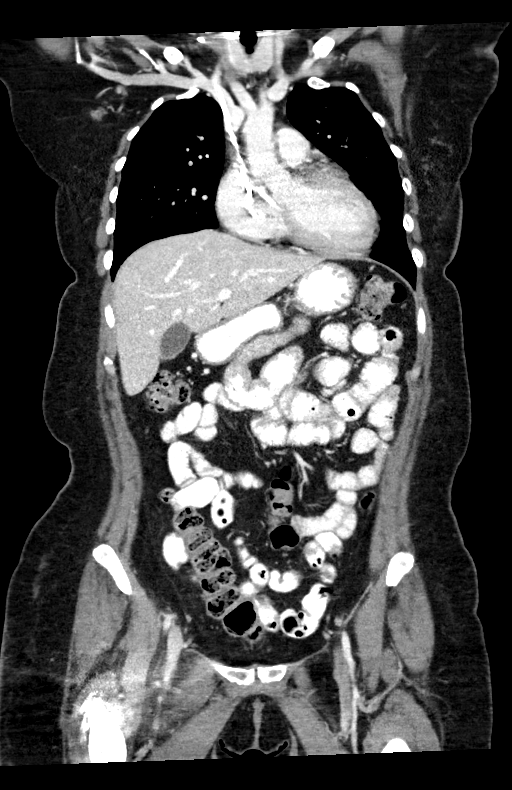
[im 78/130  mediastinal]
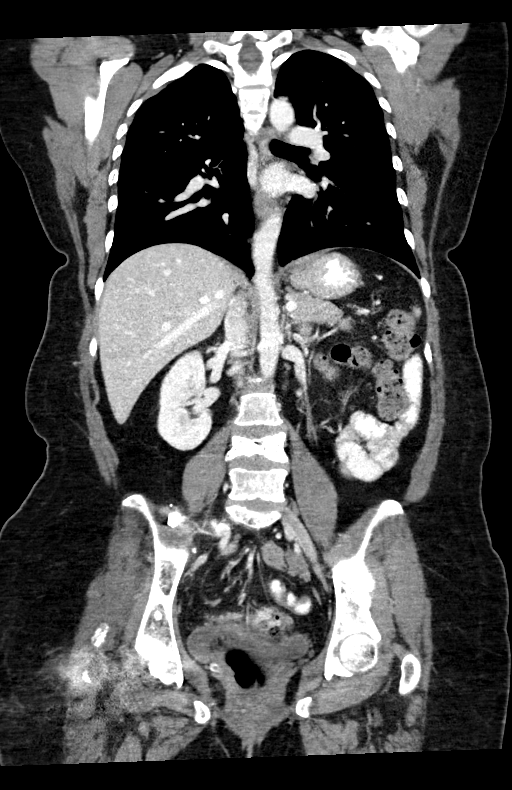

[14 of 36 positions shown; findings below may reference images not displayed]

FINDINGS: CT CHEST FINDINGS

Cardiovascular: The heart size is normal. No substantial pericardial
effusion. Right Port-A-Cath tip is positioned in the right atrium
near the tricuspid valve.

Mediastinum/Nodes: No mediastinal lymphadenopathy. There is no hilar
lymphadenopathy. The esophagus has normal imaging features. There is
no axillary lymphadenopathy.

Lungs/Pleura: Unremarkable. 4 mm posterior left lower lobe pulmonary
nodule on 92/4 is stable since prior PET-CT and also abdomen CT of
[DATE]. 4 mm left lower lobe nodule on 68/4 stable since prior
PET-CT. No suspicious pulmonary nodule or mass. No focal airspace
consolidation. No pleural effusion.

Musculoskeletal: Scattered osseous lesions have become more
sclerotic in the interval, presumably related to interval healing.
(See manubrium on 37/4 as index.

CT ABDOMEN PELVIS FINDINGS

Hepatobiliary: Small area of low attenuation in the anterior liver,
adjacent to the falciform ligament, is in a characteristic location
for focal fatty deposition. No suspicious focal abnormality within
the liver parenchyma. There is no evidence for gallstones,
gallbladder wall thickening, or pericholecystic fluid. No
intrahepatic or extrahepatic biliary dilation.

Pancreas: No focal mass lesion. No dilatation of the main duct. No
intraparenchymal cyst. No peripancreatic edema.

Spleen: No splenomegaly. No focal mass lesion.

Adrenals/Urinary Tract: No adrenal nodule or mass. Kidneys
unremarkable. No evidence for hydroureter. The urinary bladder
appears normal for the degree of distention.

Stomach/Bowel: Stomach is unremarkable. No gastric wall thickening.
No evidence of outlet obstruction. Duodenum is normally positioned
as is the ligament of Treitz. No small bowel wall thickening. No
small bowel dilatation. The terminal ileum is normal. The appendix
is normal. No gross colonic mass. No colonic wall thickening.

Vascular/Lymphatic: No abdominal aortic aneurysm. There is no
gastrohepatic or hepatoduodenal ligament lymphadenopathy. No
retroperitoneal or mesenteric lymphadenopathy. No pelvic sidewall
lymphadenopathy.

Reproductive: The uterus is unremarkable.  There is no adnexal mass.

Other: No intraperitoneal free fluid.

Musculoskeletal: Orthopedic hardware noted proximal right femur.
Stable appearance left acetabular lesion. Post treatment changes
again noted around the right SI joint, and in the T6 L1, and L4
vertebral bodies. Scattered sclerotic bone lesions are similar to
prior.
IMPRESSION: 1. No substantial interval change in exam. No new or progressive
interval findings to suggest recurrent or progressive disease.
2. Tiny left lower lobe pulmonary nodules, stable since prior
PET-CT. Likely benign, attention on follow-up recommended.
3. Scattered sclerotic bone lesions similar to prior, compatible
with treated disease.
4. Right Port-A-Cath tip is positioned in the right atrium near the
tricuspid valve.

## 2020-04-01 IMAGING — NM NM BONE WHOLE BODY
2 series · 12 of 12 positions shown · non-contrast
Comparison: None

CLINICAL DATA: Endometrial cancer, metastatic breast cancer,
assessment for metastatic disease. Pain in upper and lower back
since last week, low back pain radiating to both thighs since
[DATE], RIGHT hip pain, history of RIGHT hip surgery, multiple
kyphoplasties

EXAM:
NUCLEAR MEDICINE WHOLE BODY BONE SCAN
TECHNIQUE: Whole body anterior and posterior images were obtained approximately
3 hours after intravenous injection of radiopharmaceutical.
RADIOPHARMACEUTICALS:  22.014 mCi [TW] MDP IV

[Series 1000: 3 hr wholebody · 2.40mm/px · 2 of 2 frames shown]
[frame 1/2]
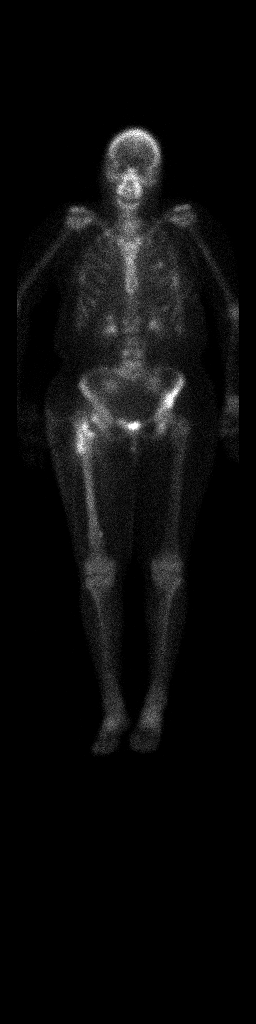
[frame 2/2]
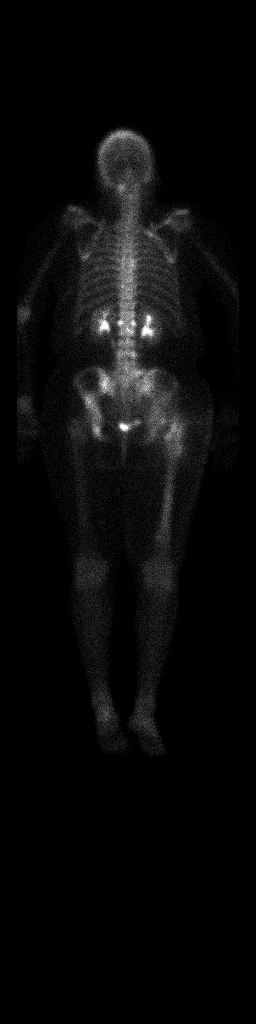

[Series 1000: statics · 2.40mm/px · 5 acquisitions, 10 frames shown]
[im 1/5]
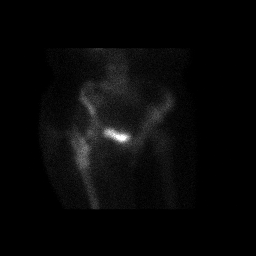
[im 1/5]
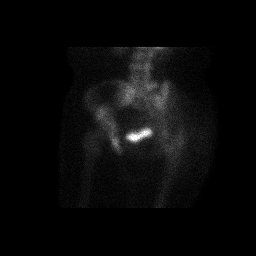
[im 2/5]
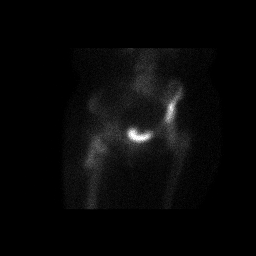
[im 2/5]
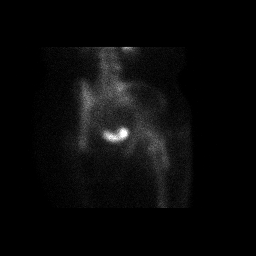
[im 3/5]
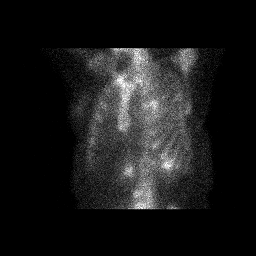
[im 3/5]
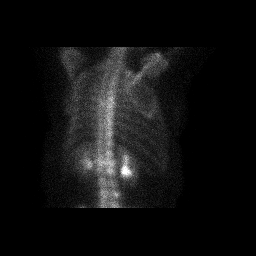
[im 4/5]
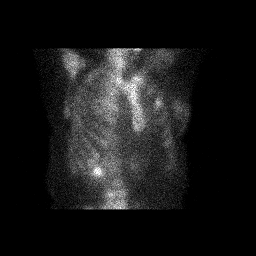
[im 4/5]
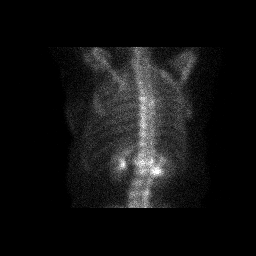
[im 5/5]
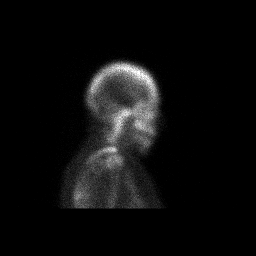
[im 5/5]
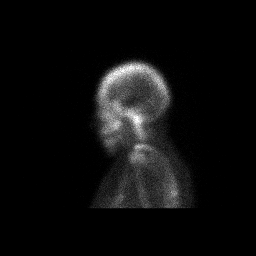

[12 of 12 positions shown; findings below may reference images not displayed]

Correlation: CT chest abdomen pelvis [DATE], PET-CT 9 in
[DATE], fluoroscopic images for during ORIF RIGHT femur
[DATE]
FINDINGS: Uptake at LEFT occipital bone, LEFT iliac bone, posterior RIGHT
second rib, and questionably RIGHT first rib suspicious for osseous
metastases.

Uptake in thoracic and lumbar spine which may be related to prior
kyphoplasties.

Uptake at proximal RIGHT femur corresponding to IM nail and
compression screw on CT.

Small focus of tracer uptake at the medial distal RIGHT femoral
metadiaphysis, corresponding to site of locking screw on prior
operative radiographs.

No additional sites of abnormal osseous tracer accumulation.

Expected urinary tract and soft tissue distribution of tracer.
IMPRESSION: Scattered sites of osseous uptake suspicious for osseous metastases
as above.

Uptake in thoracic spine and lumbar spine likely related to prior
compression fractures and kyphoplasties.

Postsurgical uptake RIGHT femur.

## 2020-04-01 MED ORDER — TECHNETIUM TC 99M MEDRONATE IV KIT
20.0000 | PACK | Freq: Once | INTRAVENOUS | Status: AC | PRN
  Administered 2020-04-01: 22.014 via INTRAVENOUS

## 2020-04-01 MED ORDER — IOHEXOL 300 MG/ML  SOLN
100.0000 mL | Freq: Once | INTRAMUSCULAR | Status: AC | PRN
  Administered 2020-04-01: 100 mL via INTRAVENOUS

## 2020-04-02 ENCOUNTER — Inpatient Hospital Stay: Payer: Self-pay

## 2020-04-02 ENCOUNTER — Ambulatory Visit: Payer: Self-pay

## 2020-04-02 ENCOUNTER — Other Ambulatory Visit: Payer: Self-pay | Admitting: *Deleted

## 2020-04-02 VITALS — BP 99/62 | HR 86 | Temp 98.2°F | Resp 18

## 2020-04-02 DIAGNOSIS — C50919 Malignant neoplasm of unspecified site of unspecified female breast: Secondary | ICD-10-CM

## 2020-04-02 DIAGNOSIS — G893 Neoplasm related pain (acute) (chronic): Secondary | ICD-10-CM

## 2020-04-02 DIAGNOSIS — Z5111 Encounter for antineoplastic chemotherapy: Secondary | ICD-10-CM

## 2020-04-02 DIAGNOSIS — M8440XA Pathological fracture, unspecified site, initial encounter for fracture: Secondary | ICD-10-CM

## 2020-04-02 LAB — CBC WITH DIFFERENTIAL/PLATELET
Abs Immature Granulocytes: 0.03 10*3/uL (ref 0.00–0.07)
Basophils Absolute: 0 10*3/uL (ref 0.0–0.1)
Basophils Relative: 0 %
Eosinophils Absolute: 0.2 10*3/uL (ref 0.0–0.5)
Eosinophils Relative: 4 %
HCT: 33.9 % — ABNORMAL LOW (ref 36.0–46.0)
Hemoglobin: 11.1 g/dL — ABNORMAL LOW (ref 12.0–15.0)
Immature Granulocytes: 1 %
Lymphocytes Relative: 25 %
Lymphs Abs: 1.1 10*3/uL (ref 0.7–4.0)
MCH: 27.7 pg (ref 26.0–34.0)
MCHC: 32.7 g/dL (ref 30.0–36.0)
MCV: 84.5 fL (ref 80.0–100.0)
Monocytes Absolute: 0.3 10*3/uL (ref 0.1–1.0)
Monocytes Relative: 8 %
Neutro Abs: 2.8 10*3/uL (ref 1.7–7.7)
Neutrophils Relative %: 62 %
Platelets: 372 10*3/uL (ref 150–400)
RBC: 4.01 MIL/uL (ref 3.87–5.11)
RDW: 15.2 % (ref 11.5–15.5)
WBC: 4.5 10*3/uL (ref 4.0–10.5)
nRBC: 0 % (ref 0.0–0.2)

## 2020-04-02 LAB — COMPREHENSIVE METABOLIC PANEL
ALT: 25 U/L (ref 0–44)
AST: 31 U/L (ref 15–41)
Albumin: 3.6 g/dL (ref 3.5–5.0)
Alkaline Phosphatase: 62 U/L (ref 38–126)
Anion gap: 6 (ref 5–15)
BUN: 8 mg/dL (ref 6–20)
CO2: 27 mmol/L (ref 22–32)
Calcium: 8.7 mg/dL — ABNORMAL LOW (ref 8.9–10.3)
Chloride: 104 mmol/L (ref 98–111)
Creatinine, Ser: 0.54 mg/dL (ref 0.44–1.00)
GFR, Estimated: >60 mL/min (ref >60)
Glucose, Bld: 157 mg/dL — ABNORMAL HIGH (ref 70–99)
Potassium: 3.3 mmol/L — ABNORMAL LOW (ref 3.5–5.1)
Sodium: 137 mmol/L (ref 135–145)
Total Bilirubin: 0.4 mg/dL (ref 0.3–1.2)
Total Protein: 6.6 g/dL (ref 6.5–8.1)

## 2020-04-02 MED ORDER — SODIUM CHLORIDE 0.9 % IV SOLN
420.0000 mg | Freq: Once | INTRAVENOUS | Status: AC
  Administered 2020-04-02: 420 mg via INTRAVENOUS
  Filled 2020-04-02: qty 14

## 2020-04-02 MED ORDER — SODIUM CHLORIDE 0.9% FLUSH
10.0000 mL | INTRAVENOUS | Status: DC | PRN
  Filled 2020-04-02: qty 10

## 2020-04-02 MED ORDER — OXYCODONE HCL ER 10 MG PO T12A: 10.0000 mg | EXTENDED_RELEASE_TABLET | Freq: Two times a day (BID) | ORAL | 0 refills | Status: DC

## 2020-04-02 MED ORDER — DIPHENHYDRAMINE HCL 50 MG/ML IJ SOLN
50.0000 mg | Freq: Once | INTRAMUSCULAR | Status: AC
  Administered 2020-04-02: 50 mg via INTRAVENOUS
  Filled 2020-04-02: qty 1

## 2020-04-02 MED ORDER — ACETAMINOPHEN 325 MG PO TABS
650.0000 mg | ORAL_TABLET | Freq: Once | ORAL | Status: AC
  Administered 2020-04-02: 650 mg via ORAL
  Filled 2020-04-02: qty 2

## 2020-04-02 MED ORDER — MORPHINE SULFATE (PF) 2 MG/ML IV SOLN
INTRAVENOUS | Status: AC
  Filled 2020-04-02: qty 2

## 2020-04-02 MED ORDER — TRASTUZUMAB-ANNS CHEMO 150 MG IV SOLR
6.0000 mg/kg | Freq: Once | INTRAVENOUS | Status: AC
  Administered 2020-04-02: 357 mg via INTRAVENOUS
  Filled 2020-04-02: qty 17

## 2020-04-02 MED ORDER — SODIUM CHLORIDE 0.9% FLUSH
10.0000 mL | Freq: Once | INTRAVENOUS | Status: AC
  Administered 2020-04-02: 10 mL via INTRAVENOUS
  Filled 2020-04-02: qty 10

## 2020-04-02 MED ORDER — SODIUM CHLORIDE 0.9 % IV SOLN
Freq: Once | INTRAVENOUS | Status: AC
  Filled 2020-04-02: qty 250

## 2020-04-02 MED ORDER — SODIUM CHLORIDE 0.9 % IV SOLN
20.0000 mg | Freq: Once | INTRAVENOUS | Status: AC
  Administered 2020-04-02: 20 mg via INTRAVENOUS
  Filled 2020-04-02: qty 20

## 2020-04-02 MED ORDER — HEPARIN SOD (PORK) LOCK FLUSH 100 UNIT/ML IV SOLN
500.0000 [IU] | Freq: Once | INTRAVENOUS | Status: AC
  Administered 2020-04-02: 500 [IU] via INTRAVENOUS
  Filled 2020-04-02: qty 5

## 2020-04-02 MED ORDER — OXYCODONE HCL 5 MG PO CAPS: 5.0000 mg | ORAL_CAPSULE | ORAL | 0 refills | Status: DC | PRN

## 2020-04-02 MED ORDER — MORPHINE SULFATE (PF) 2 MG/ML IV SOLN
4.0000 mg | Freq: Once | INTRAVENOUS | Status: AC
Start: 2020-04-02 — End: 2020-04-02
  Administered 2020-04-02: 4 mg via INTRAVENOUS

## 2020-04-02 MED ORDER — SODIUM CHLORIDE 0.9 % IV SOLN
65.0000 mg/m2 | Freq: Once | INTRAVENOUS | Status: AC
  Administered 2020-04-02: 108 mg via INTRAVENOUS
  Filled 2020-04-02: qty 18

## 2020-04-02 MED ORDER — HEPARIN SOD (PORK) LOCK FLUSH 100 UNIT/ML IV SOLN
500.0000 [IU] | Freq: Once | INTRAVENOUS | Status: DC | PRN
  Filled 2020-04-02: qty 5

## 2020-04-02 MED ORDER — FAMOTIDINE IN NACL 20-0.9 MG/50ML-% IV SOLN
20.0000 mg | Freq: Once | INTRAVENOUS | Status: AC
  Administered 2020-04-02: 20 mg via INTRAVENOUS
  Filled 2020-04-02: qty 50

## 2020-04-02 NOTE — Progress Notes (Signed)
Patient received prescribed treatment in clinic. Tolerated well. Patient stable at discharge. 

## 2020-04-09 ENCOUNTER — Inpatient Hospital Stay: Payer: Self-pay

## 2020-04-09 ENCOUNTER — Other Ambulatory Visit: Payer: Self-pay

## 2020-04-09 ENCOUNTER — Ambulatory Visit: Payer: Self-pay | Admitting: Oncology

## 2020-04-09 ENCOUNTER — Inpatient Hospital Stay (HOSPITAL_BASED_OUTPATIENT_CLINIC_OR_DEPARTMENT_OTHER): Payer: Self-pay | Admitting: Oncology

## 2020-04-09 ENCOUNTER — Encounter: Payer: Self-pay | Admitting: Oncology

## 2020-04-09 VITALS — BP 98/62 | HR 85 | Temp 97.1°F | Resp 16 | Wt 134.0 lb

## 2020-04-09 DIAGNOSIS — C50919 Malignant neoplasm of unspecified site of unspecified female breast: Secondary | ICD-10-CM

## 2020-04-09 DIAGNOSIS — M8440XA Pathological fracture, unspecified site, initial encounter for fracture: Secondary | ICD-10-CM

## 2020-04-09 DIAGNOSIS — Z5111 Encounter for antineoplastic chemotherapy: Secondary | ICD-10-CM

## 2020-04-09 DIAGNOSIS — C7951 Secondary malignant neoplasm of bone: Secondary | ICD-10-CM

## 2020-04-09 LAB — CBC WITH DIFFERENTIAL/PLATELET
Abs Immature Granulocytes: 0.03 10*3/uL (ref 0.00–0.07)
Basophils Absolute: 0 10*3/uL (ref 0.0–0.1)
Basophils Relative: 1 %
Eosinophils Absolute: 0.1 10*3/uL (ref 0.0–0.5)
Eosinophils Relative: 3 %
HCT: 33.1 % — ABNORMAL LOW (ref 36.0–46.0)
Hemoglobin: 11.1 g/dL — ABNORMAL LOW (ref 12.0–15.0)
Immature Granulocytes: 1 %
Lymphocytes Relative: 25 %
Lymphs Abs: 1.1 10*3/uL (ref 0.7–4.0)
MCH: 28.4 pg (ref 26.0–34.0)
MCHC: 33.5 g/dL (ref 30.0–36.0)
MCV: 84.7 fL (ref 80.0–100.0)
Monocytes Absolute: 0.3 10*3/uL (ref 0.1–1.0)
Monocytes Relative: 6 %
Neutro Abs: 2.8 10*3/uL (ref 1.7–7.7)
Neutrophils Relative %: 64 %
Platelets: 342 10*3/uL (ref 150–400)
RBC: 3.91 MIL/uL (ref 3.87–5.11)
RDW: 15.8 % — ABNORMAL HIGH (ref 11.5–15.5)
WBC: 4.3 10*3/uL (ref 4.0–10.5)
nRBC: 0 % (ref 0.0–0.2)

## 2020-04-09 LAB — COMPREHENSIVE METABOLIC PANEL
ALT: 16 U/L (ref 0–44)
AST: 24 U/L (ref 15–41)
Albumin: 3.6 g/dL (ref 3.5–5.0)
Alkaline Phosphatase: 51 U/L (ref 38–126)
Anion gap: 3 — ABNORMAL LOW (ref 5–15)
BUN: 11 mg/dL (ref 6–20)
CO2: 28 mmol/L (ref 22–32)
Calcium: 8.4 mg/dL — ABNORMAL LOW (ref 8.9–10.3)
Chloride: 104 mmol/L (ref 98–111)
Creatinine, Ser: 0.51 mg/dL (ref 0.44–1.00)
GFR, Estimated: >60 mL/min (ref >60)
Glucose, Bld: 124 mg/dL — ABNORMAL HIGH (ref 70–99)
Potassium: 3.5 mmol/L (ref 3.5–5.1)
Sodium: 135 mmol/L (ref 135–145)
Total Bilirubin: 0.4 mg/dL (ref 0.3–1.2)
Total Protein: 6.4 g/dL — ABNORMAL LOW (ref 6.5–8.1)

## 2020-04-09 MED ORDER — SODIUM CHLORIDE 0.9 % IV SOLN
65.0000 mg/m2 | Freq: Once | INTRAVENOUS | Status: AC
  Administered 2020-04-09: 108 mg via INTRAVENOUS
  Filled 2020-04-09: qty 18

## 2020-04-09 MED ORDER — HEPARIN SOD (PORK) LOCK FLUSH 100 UNIT/ML IV SOLN
INTRAVENOUS | Status: AC
  Filled 2020-04-09: qty 5

## 2020-04-09 MED ORDER — FAMOTIDINE IN NACL 20-0.9 MG/50ML-% IV SOLN
20.0000 mg | Freq: Once | INTRAVENOUS | Status: AC
  Administered 2020-04-09: 20 mg via INTRAVENOUS
  Filled 2020-04-09: qty 50

## 2020-04-09 MED ORDER — TRAZODONE HCL 100 MG PO TABS: 100.0000 mg | ORAL_TABLET | Freq: Every day | ORAL | 2 refills | Status: DC

## 2020-04-09 MED ORDER — DIPHENHYDRAMINE HCL 50 MG/ML IJ SOLN
50.0000 mg | Freq: Once | INTRAMUSCULAR | Status: AC
  Administered 2020-04-09: 50 mg via INTRAVENOUS
  Filled 2020-04-09: qty 1

## 2020-04-09 MED ORDER — SODIUM CHLORIDE 0.9 % IV SOLN
Freq: Once | INTRAVENOUS | Status: AC
  Filled 2020-04-09: qty 250

## 2020-04-09 MED ORDER — HEPARIN SOD (PORK) LOCK FLUSH 100 UNIT/ML IV SOLN
500.0000 [IU] | Freq: Once | INTRAVENOUS | Status: AC | PRN
  Administered 2020-04-09: 500 [IU]
  Filled 2020-04-09: qty 5

## 2020-04-09 MED ORDER — SODIUM CHLORIDE 0.9 % IV SOLN
20.0000 mg | Freq: Once | INTRAVENOUS | Status: AC
  Administered 2020-04-09: 20 mg via INTRAVENOUS
  Filled 2020-04-09: qty 20

## 2020-04-09 NOTE — Progress Notes (Signed)
Hematology/Oncology Consult note [facility]  [phone] [phone]  Patient Care Team: Center, Bee, NP as PCP - General Rico Junker, RN as Registered Nurse Theodore Demark, RN as Registered Nurse   Name of the patient: Sophia [name]  [id number]  1980-08-31   Date of visit: 04/09/20  Diagnosis- stage IV metastatic breast cancer ER/PR negative HER-2/neu positive with bone metastases  Chief complaint/ Reason for visit-on treatment assessment prior to cycle 11-day 8 of Taxol  Heme/Onc history: patient is a 40 year old Hispanic female who is here with her friend. History obtained with the help of an interpreter.Patient self palpated left breast mass which was followed by a diagnostic bilateral mammogram. Mammogram showed 3.1 x 2.9 x 1.9 cm hypoechoic mass at the 1 o'clock position of the left breast. For abnormal cortically thickened left axillary lymph nodes measuring up to 5 mm. 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 Patient was also having ongoing back pain and was seen by [facility] orthopedics Dr. Doyle Askew who ordered MRI lumbar spine without contrast which showed possible pathologic fractures of L1 and L4 vertebral bodies with greater than 50% height loss at L1 and abnormal signal involving L2-L3 S1 as well as right iliac bone concerning for metastatic disease.Patient is a single mother of 3 adult children and is very anxious today.She reports significant back pain which radiates to her bilateral thighs. Denies any focal tingling numbness or weakness. Denies any bowel bladder incontinence. Pain has been uncontrolled despite taking Tylenol. No prior history of abnormal breast biopsies. No family history of breast cancer  PET and MRIshowed 3 areas of pathologic fracture of her spine as well as  widespread bony metastatic disease and concern for impending fracture of the right hip. Given her worsening pain she was asked to come to the ER. She has been evaluated by Dr. Rudene Christians from orthopedic surgery and underwent kyphoplasty at 3 different levels.T6 L1 and L4 along with radiofrequency ablation.She also underwent prophylactic fixation of the right hip and not affected the sacral region.   Patient received first dose of Herceptin and Perjeta on 09/04/2019. Baseline echocardiogram normal.she is currently getting taxol/ herceptin/perjeta  Patient admitted to hospital for acute abdominal pain with CT findings concerning for acute colitis. Colonoscopy showed diffuse severe inflammation with erythema friability and loss of vascularity and shallow ulcerations in the IC valve, ascending colon and cecum.   Interval history-patient reports baseline right hip pain as well as chronic left flank pain which has remained unchanged.  Denies any tingling numbness in her hands and feet  ECOG PS- 1 Pain scale- 4 Opioid associated constipation- no  Review of systems- Review of Systems  Constitutional: Positive for malaise/fatigue. Negative for chills, fever and weight loss.  HENT: Negative for congestion, ear discharge and nosebleeds.   Eyes: Negative for blurred vision.  Respiratory: Negative for cough, hemoptysis, sputum production, shortness of breath and wheezing.   Cardiovascular: Negative for chest pain, palpitations, orthopnea and claudication.  Gastrointestinal: Negative for abdominal pain, blood in stool, constipation, diarrhea, heartburn, melena, nausea and vomiting.  Genitourinary: Negative for dysuria, flank pain, frequency, hematuria and urgency.  Musculoskeletal: Positive for joint pain (Right hip pain). Negative for back pain and myalgias.  Skin: Negative for rash.  Neurological: Negative for dizziness, tingling, focal weakness, seizures, weakness and headaches.   Endo/Heme/Allergies: Does not  bruise/bleed easily.  Psychiatric/Behavioral: Negative for depression and suicidal ideas. The patient does not have insomnia.       No Known Allergies   Past Medical History:  Diagnosis Date   Anxiety    Breast cancer ([place])    with mets   Cancer ([place])    Colitis    Family history of colon cancer    Vertigo      Past Surgical History:  Procedure Laterality Date   BREAST BIOPSY Left 08/14/2019   [place] bx of mass, coil marker, path pending   BREAST BIOPSY Left 08/14/2019   [place] bx of LN, hydromarker, path pending   BREAST BIOPSY Left 08/14/2019   affirm bx of calcs, x marker, path pending   ESOPHAGOGASTRODUODENOSCOPY (EGD) WITH PROPOFOL N/A 10/05/2019   Procedure: ESOPHAGOGASTRODUODENOSCOPY (EGD) WITH PROPOFOL;  Surgeon: Lin Landsman, MD;  Location: [place];  Service: Gastroenterology;  Laterality: N/A;   FLEXIBLE SIGMOIDOSCOPY N/A 10/05/2019   Procedure: FLEXIBLE SIGMOIDOSCOPY;  Surgeon: Lin Landsman, MD;  Location: [facility] ENDOSCOPY;  Service: Gastroenterology;  Laterality: N/A;   INTRAMEDULLARY (IM) NAIL INTERTROCHANTERIC Right 09/01/2019   Procedure: INTRAMEDULLARY (IM) NAIL INTERTROCHANTRIC AND RADIOFREQUENCY ABLATION;  Surgeon: Hessie Knows, MD;  Location: [facility] ORS;  Service: Orthopedics;  Laterality: Right;   KYPHOPLASTY N/A 08/29/2019   Procedure: KYPHOPLASTY T6, L1,L4 ,  RADIOFREQUENCY ABLATION;  Surgeon: Hessie Knows, MD;  Location: [facility] ORS;  Service: Orthopedics;  Laterality: N/A;   KYPHOPLASTY Right 09/01/2019   Procedure: Right Sacral Radiofrequency Ablation and Cement Augmentation, Right sacrum and iliac crest;  Surgeon: Hessie Knows, MD;  Location: [facility] ORS;  Service: Orthopedics;  Laterality: Right;   PORTA CATH INSERTION N/A 08/28/2019   Procedure: PORTA CATH INSERTION;  Surgeon: Algernon Huxley, MD;  Location: [employer] [facility] CV LAB;  Service: Cardiovascular;  Laterality: N/A;    Social History    Socioeconomic History   Marital status: Single    Spouse name: Not on file   Number of children: Not on file   Years of education: Not on file   Highest education level: Not on file  Occupational History   Not on file  Tobacco Use   Smoking status: Never Smoker   Smokeless tobacco: Never Used  Vaping Use   Vaping Use: Never used  Substance and Sexual Activity   Alcohol use: Not Currently   Drug use: Not Currently   Sexual activity: Not Currently    Birth control/protection: None  Other Topics Concern   Not on file  Social History Narrative   Lives at home with children   Social Determinants of Health   Financial Resource Strain: Not on file  Food Insecurity: Not on file  Transportation Needs: Not on file  Physical Activity: Not on file  Stress: Not on file  Social Connections: Not on file  Intimate Partner Violence: Not on file    Family History  Problem Relation Age of Onset   Colon cancer Maternal Uncle      Current Outpatient Medications:    diphenoxylate-atropine (LOMOTIL) 2.5-0.025 MG tablet, Take 1 tablet by mouth 4 (four) times daily as needed for diarrhea or loose stools., Disp: 30 tablet, Rfl: 0   FLUoxetine (PROZAC) 10 MG capsule, Take 1 capsule (10 mg total) by mouth daily., Disp: 30 capsule, Rfl: 1   gabapentin (NEURONTIN) 300 MG capsule, Take 1 capsule (300 mg total) by mouth 2 (two) times daily., Disp: 60 capsule, Rfl: 1   lidocaine-prilocaine (EMLA) cream, Apply 1 application topically as  needed. Apply small amount to port site at least 1 hour prior to it being accessed, cover with plastic wrap, Disp: 30 g, Rfl: 1   nystatin (MYCOSTATIN/NYSTOP) powder, Apply 1 application topically 4 (four) times daily., Disp: 60 g, Rfl: 0   oxycodone (OXY-IR) 5 MG capsule, Take 1-2 capsules (5-10 mg total) by mouth every 4 (four) hours as needed., Disp: 60 capsule, Rfl: 0   oxyCODONE (OXYCONTIN) 10 mg 12 hr tablet, Take 1 tablet (10 mg total) by  mouth every 12 (twelve) hours., Disp: 60 tablet, Rfl: 0   traZODone (DESYREL) 100 MG tablet, Take 1 tablet (100 mg total) by mouth at bedtime., Disp: 30 tablet, Rfl: 2  Physical exam:  Vitals:   04/09/20 0948  BP: 98/62  Pulse: 85  Resp: 16  Temp: (!) 97.1 F (36.2 C)  TempSrc: Tympanic  SpO2: 98%  Weight: 134 lb (60.8 kg)   Physical Exam Constitutional:      General: She is not in acute distress. Eyes:     Extraocular Movements: EOM normal.  Cardiovascular:     Rate and Rhythm: Normal rate and regular rhythm.  Pulmonary:     Effort: Pulmonary effort is normal.  Skin:    General: Skin is warm and dry.  Neurological:     Mental Status: She is alert and oriented to person, place, and time.      CMP Latest Ref Rng & Units 04/09/2020  Glucose 70 - 99 mg/dL 124(H)  BUN 6 - 20 mg/dL 11  Creatinine 0.44 - 1.00 mg/dL 0.51  Sodium 135 - 145 mmol/L 135  Potassium 3.5 - 5.1 mmol/L 3.5  Chloride 98 - 111 mmol/L 104  CO2 22 - 32 mmol/L 28  Calcium 8.9 - 10.3 mg/dL 8.4(L)  Total Protein 6.5 - 8.1 g/dL 6.4(L)  Total Bilirubin 0.3 - 1.2 mg/dL 0.4  Alkaline Phos 38 - 126 U/L 51  AST 15 - 41 U/L 24  ALT 0 - 44 U/L 16   CBC Latest Ref Rng & Units 04/09/2020  WBC 4.0 - 10.5 K/uL 4.3  Hemoglobin 12.0 - 15.0 g/dL 11.1(L)  Hematocrit 36.0 - 46.0 % 33.1(L)  Platelets 150 - 400 K/uL 342    No images are attached to the encounter.  NM Bone Scan Whole Body  Result Date: 04/01/2020 CLINICAL DATA:  Endometrial cancer, metastatic breast cancer, assessment for metastatic disease. Pain in upper and lower back since last week, low back pain radiating to both thighs since 03/26/2020, RIGHT hip pain, history of RIGHT hip surgery, multiple kyphoplasties EXAM: NUCLEAR MEDICINE WHOLE BODY BONE SCAN TECHNIQUE: Whole body anterior and posterior images were obtained approximately 3 hours after intravenous injection of radiopharmaceutical. RADIOPHARMACEUTICALS:  22.014 mCi Technetium-15mMDP IV  COMPARISON:  None Correlation: CT chest abdomen pelvis 04/01/2020, PET-CT 9 in 11/07/2019, fluoroscopic images for during ORIF RIGHT femur 09/01/2019 FINDINGS: Uptake at LEFT occipital bone, LEFT iliac bone, posterior RIGHT second rib, and questionably RIGHT first rib suspicious for osseous metastases. Uptake in thoracic and lumbar spine which may be related to prior kyphoplasties. Uptake at proximal RIGHT femur corresponding to IM nail and compression screw on CT. Small focus of tracer uptake at the medial distal RIGHT femoral metadiaphysis, corresponding to site of locking screw on prior operative radiographs. No additional sites of abnormal osseous tracer accumulation. Expected urinary tract and soft tissue distribution of tracer. IMPRESSION: Scattered sites of osseous uptake suspicious for osseous metastases as above. Uptake in thoracic spine and lumbar spine likely related  to prior compression fractures and kyphoplasties. Postsurgical uptake RIGHT femur. Electronically Signed   By: Lavonia Dana M.D.   On: 04/01/2020 15:36   CT CHEST ABDOMEN PELVIS W CONTRAST  Result Date: 04/01/2020 CLINICAL DATA:  Metastatic breast cancer.  Restaging. EXAM: CT CHEST, ABDOMEN, AND PELVIS WITH CONTRAST TECHNIQUE: Multidetector CT imaging of the chest, abdomen and pelvis was performed following the standard protocol during bolus administration of intravenous contrast. CONTRAST:  154m OMNIPAQUE IOHEXOL 300 MG/ML  SOLN COMPARISON:  PET-CT 12/16/2019 FINDINGS: CT CHEST FINDINGS Cardiovascular: The heart size is normal. No substantial pericardial effusion. Right Port-A-Cath tip is positioned in the right atrium near the tricuspid valve. Mediastinum/Nodes: No mediastinal lymphadenopathy. There is no hilar lymphadenopathy. The esophagus has normal imaging features. There is no axillary lymphadenopathy. Lungs/Pleura: Unremarkable. 4 mm posterior left lower lobe pulmonary nodule on 92/4 is stable since prior PET-CT and also abdomen  CT of 10/01/2019. 4 mm left lower lobe nodule on 68/4 stable since prior PET-CT. No suspicious pulmonary nodule or mass. No focal airspace consolidation. No pleural effusion. Musculoskeletal: Scattered osseous lesions have become more sclerotic in the interval, presumably related to interval healing. (See manubrium on 37/4 as index. CT ABDOMEN PELVIS FINDINGS Hepatobiliary: Small area of low attenuation in the anterior liver, adjacent to the falciform ligament, is in a characteristic location for focal fatty deposition. No suspicious focal abnormality within the liver parenchyma. There is no evidence for gallstones, gallbladder wall thickening, or pericholecystic fluid. No intrahepatic or extrahepatic biliary dilation. Pancreas: No focal mass lesion. No dilatation of the main duct. No intraparenchymal cyst. No peripancreatic edema. Spleen: No splenomegaly. No focal mass lesion. Adrenals/Urinary Tract: No adrenal nodule or mass. Kidneys unremarkable. No evidence for hydroureter. The urinary bladder appears normal for the degree of distention. Stomach/Bowel: Stomach is unremarkable. No gastric wall thickening. No evidence of outlet obstruction. Duodenum is normally positioned as is the ligament of Treitz. No small bowel wall thickening. No small bowel dilatation. The terminal ileum is normal. The appendix is normal. No gross colonic mass. No colonic wall thickening. Vascular/Lymphatic: No abdominal aortic aneurysm. There is no gastrohepatic or hepatoduodenal ligament lymphadenopathy. No retroperitoneal or mesenteric lymphadenopathy. No pelvic sidewall lymphadenopathy. Reproductive: The uterus is unremarkable.  There is no adnexal mass. Other: No intraperitoneal free fluid. Musculoskeletal: Orthopedic hardware noted proximal right femur. Stable appearance left acetabular lesion. Post treatment changes again noted around the right SI joint, and in the T6 L1, and L4 vertebral bodies. Scattered sclerotic bone lesions are  similar to prior. IMPRESSION: 1. No substantial interval change in exam. No new or progressive interval findings to suggest recurrent or progressive disease. 2. Tiny left lower lobe pulmonary nodules, stable since prior PET-CT. Likely benign, attention on follow-up recommended. 3. Scattered sclerotic bone lesions similar to prior, compatible with treated disease. 4. Right Port-A-Cath tip is positioned in the right atrium near the tricuspid valve. Electronically Signed   By: EMisty StanleyM.D.   On: 04/01/2020 10:26   ECHOCARDIOGRAM COMPLETE  Result Date: 03/23/2020    ECHOCARDIOGRAM REPORT   [facility] Medical Rec #:  [id number]            Height:       60.0 in Accession #:    [id number]           Weight:       133.4 lb Date of Birth:  9August 01, 1982  BSA:          1.571 m Patient Age:    39 years              BP:           103/65 mmHg Patient Gender: F                     HR:           80 bpm. Exam Location:  [facility] Procedure: 2D Echo, Cardiac Doppler, Color Doppler, Strain Analysis and 3D Echo Indications:     Chemo Z09  History:         Patient has prior history of Echocardiogram examinations, most                  recent 12/02/2019. Anxiety, breast cancer,, chemo patient.  Sonographer:     Sherrie Sport RDCS (AE) Referring Phys:  [id number] Weston Anna Joeangel Jeanpaul Diagnosing Phys: Serafina Royals MD  Sonographer Comments: Global longitudinal strain was attempted. IMPRESSIONS  1. Left ventricular ejection fraction, by estimation, is 60 to 65%. The left ventricle has normal function. The left ventricle has no regional wall motion abnormalities. Left ventricular diastolic parameters were normal.  2. Right ventricular systolic function is normal. The right ventricular size is normal.  3. The mitral valve is normal in structure. No evidence of mitral valve regurgitation.  4. The aortic valve is normal in structure. Aortic valve regurgitation is not visualized. FINDINGS  Left  Ventricle: Left ventricular ejection fraction, by estimation, is 60 to 65%. The left ventricle has normal function. The left ventricle has no regional wall motion abnormalities. The left ventricular internal cavity size was normal in size. There is  no left ventricular hypertrophy. Left ventricular diastolic parameters were normal. Right Ventricle: The right ventricular size is normal. No increase in right ventricular wall thickness. Right ventricular systolic function is normal. Left Atrium: Left atrial size was normal in size. Right Atrium: Right atrial size was normal in size. Pericardium: There is no evidence of pericardial effusion. Mitral Valve: The mitral valve is normal in structure. No evidence of mitral valve regurgitation. Tricuspid Valve: The tricuspid valve is normal in structure. Tricuspid valve regurgitation is trivial. Aortic Valve: The aortic valve is normal in structure. Aortic valve regurgitation is not visualized. Aortic valve mean gradient measures 5.0 mmHg. Aortic valve peak gradient measures 9.7 mmHg. Aortic valve area, by VTI measures 2.41 cm. Pulmonic Valve: The pulmonic valve was normal in structure. Pulmonic valve regurgitation is not visualized. Aorta: The aortic root and ascending aorta are structurally normal, with no evidence of dilitation. IAS/Shunts: No atrial level shunt detected by color flow Doppler.  LEFT VENTRICLE PLAX 2D LVIDd:         4.50 cm  Diastology LVIDs:         2.71 cm  LV e' medial:    7.94 cm/s LV PW:         0.63 cm  LV E/e' medial:  11.6 LV IVS:        0.72 cm  LV e' lateral:   14.70 cm/s LVOT diam:     2.00 cm  LV E/e' lateral: 6.3 LV SV:         68 LV SV Index:   43 LVOT Area:     3.14 cm                          3D Volume  EF:                         3D EF:        67 %                         LV EDV:       151 ml                         LV ESV:       49 ml                         LV SV:        102 ml RIGHT VENTRICLE RV Basal diam:  2.73 cm RV S prime:     13.60  cm/s TAPSE (M-mode): 4.3 cm LEFT ATRIUM             Index       RIGHT ATRIUM           Index LA diam:        3.40 cm 2.16 cm/m  RA Area:     17.50 cm LA Vol (A2C):   58.4 ml 37.17 ml/m RA Volume:   45.60 ml  29.02 ml/m LA Vol (A4C):   43.1 ml 27.43 ml/m LA Biplane Vol: 51.9 ml 33.03 ml/m  AORTIC VALVE                    PULMONIC VALVE AV Area (Vmax):    2.22 cm     PV Vmax:        1.05 m/s AV Area (Vmean):   2.28 cm     PV Peak grad:   4.4 mmHg AV Area (VTI):     2.41 cm     RVOT Peak grad: 8 mmHg AV Vmax:           156.00 cm/s AV Vmean:          108.000 cm/s AV VTI:            0.283 m AV Peak Grad:      9.7 mmHg AV Mean Grad:      5.0 mmHg LVOT Vmax:         110.00 cm/s LVOT Vmean:        78.300 cm/s LVOT VTI:          0.217 m LVOT/AV VTI ratio: 0.77  AORTA Ao Root diam: 2.60 cm MITRAL VALVE               TRICUSPID VALVE MV Area (PHT): 3.56 cm    TR Peak grad:   12.7 mmHg MV Decel Time: 213 msec    TR Vmax:        178.00 cm/s MV E velocity: 92.50 cm/s MV A velocity: 81.80 cm/s  SHUNTS MV E/A ratio:  1.13        Systemic VTI:  0.22 m                            Systemic Diam: 2.00 cm Serafina Royals MD Electronically signed by Serafina Royals MD Signature Date/Time: 03/23/2020/5:00:55 PM    Final      Assessment and plan- Patient is a 40 y.o. female metastatic ER negative her 2 positive breast cancer on taxol/herceptin and perjeta.  She is here for on treatment assessment prior to cycle 11-day 8 of Taxol  Patient received Taxol Herceptin and Perjeta on last week.  She will receive Taxol this week and next week and I will see her back in 2 weeks for cycle 12-day 1 of Taxol Herceptin and Perjeta  Repeat CT chest abdomen pelvis as well as bone scan overall shows stable evidence of bone metastases disease.  She has a 4 mm left lower lobe lung nodule which has remained stable in the last 6 months And could be possibly benign.  No other new areas of progressive disease.  Tumor markers are trending down from  170 down to 6.8.  Plan is to continue Taxol Herceptin and Perjeta until progression or toxicity.  Right hip pain: No evidence of worsening disease on the bone scan.  Continue as needed oxycodone and OxyContin  Insomnia: She is on trazodone and we will refill her medication today   Visit Diagnosis 1. Bone metastases ([place])   2. Metastatic breast cancer ([place])   3. Encounter for antineoplastic chemotherapy      Dr. Randa Evens, MD, MPH [facility] at [facility] [phone] 04/09/2020 12:30 PM

## 2020-04-10 LAB — CANCER ANTIGEN 27.29: CA 27.29: 13.4 U/mL (ref 0.0–38.6)

## 2020-04-14 ENCOUNTER — Telehealth: Payer: Self-pay | Admitting: Licensed Clinical Social Worker

## 2020-04-16 ENCOUNTER — Other Ambulatory Visit: Payer: Self-pay

## 2020-04-16 ENCOUNTER — Encounter: Payer: Self-pay | Admitting: Licensed Clinical Social Worker

## 2020-04-16 ENCOUNTER — Inpatient Hospital Stay: Payer: Self-pay

## 2020-04-16 ENCOUNTER — Telehealth: Payer: Self-pay | Admitting: *Deleted

## 2020-04-16 ENCOUNTER — Ambulatory Visit: Payer: Self-pay | Admitting: Licensed Clinical Social Worker

## 2020-04-16 ENCOUNTER — Other Ambulatory Visit: Payer: Self-pay | Admitting: *Deleted

## 2020-04-16 VITALS — BP 123/81 | HR 63 | Temp 99.1°F | Resp 16 | Wt 131.2 lb

## 2020-04-16 DIAGNOSIS — Z800 Family history of malignant neoplasm of digestive organs: Secondary | ICD-10-CM

## 2020-04-16 DIAGNOSIS — C50919 Malignant neoplasm of unspecified site of unspecified female breast: Secondary | ICD-10-CM

## 2020-04-16 DIAGNOSIS — M8440XA Pathological fracture, unspecified site, initial encounter for fracture: Secondary | ICD-10-CM

## 2020-04-16 DIAGNOSIS — Z1379 Encounter for other screening for genetic and chromosomal anomalies: Secondary | ICD-10-CM

## 2020-04-16 LAB — CBC WITH DIFFERENTIAL/PLATELET
Abs Immature Granulocytes: 0.02 10*3/uL (ref 0.00–0.07)
Basophils Absolute: 0 10*3/uL (ref 0.0–0.1)
Basophils Relative: 1 %
Eosinophils Absolute: 0.1 10*3/uL (ref 0.0–0.5)
Eosinophils Relative: 2 %
HCT: 35 % — ABNORMAL LOW (ref 36.0–46.0)
Hemoglobin: 11.4 g/dL — ABNORMAL LOW (ref 12.0–15.0)
Immature Granulocytes: 0 %
Lymphocytes Relative: 27 %
Lymphs Abs: 1.3 10*3/uL (ref 0.7–4.0)
MCH: 27.3 pg (ref 26.0–34.0)
MCHC: 32.6 g/dL (ref 30.0–36.0)
MCV: 83.7 fL (ref 80.0–100.0)
Monocytes Absolute: 0.3 10*3/uL (ref 0.1–1.0)
Monocytes Relative: 7 %
Neutro Abs: 2.9 10*3/uL (ref 1.7–7.7)
Neutrophils Relative %: 63 %
Platelets: 408 10*3/uL — ABNORMAL HIGH (ref 150–400)
RBC: 4.18 MIL/uL (ref 3.87–5.11)
RDW: 16.1 % — ABNORMAL HIGH (ref 11.5–15.5)
WBC: 4.6 10*3/uL (ref 4.0–10.5)
nRBC: 0 % (ref 0.0–0.2)

## 2020-04-16 LAB — COMPREHENSIVE METABOLIC PANEL
ALT: 16 U/L (ref 0–44)
AST: 25 U/L (ref 15–41)
Albumin: 3.7 g/dL (ref 3.5–5.0)
Alkaline Phosphatase: 50 U/L (ref 38–126)
Anion gap: 8 (ref 5–15)
BUN: 14 mg/dL (ref 6–20)
CO2: 23 mmol/L (ref 22–32)
Calcium: 8.6 mg/dL — ABNORMAL LOW (ref 8.9–10.3)
Chloride: 103 mmol/L (ref 98–111)
Creatinine, Ser: 0.43 mg/dL — ABNORMAL LOW (ref 0.44–1.00)
GFR, Estimated: >60 mL/min (ref >60)
Glucose, Bld: 117 mg/dL — ABNORMAL HIGH (ref 70–99)
Potassium: 3.2 mmol/L — ABNORMAL LOW (ref 3.5–5.1)
Sodium: 134 mmol/L — ABNORMAL LOW (ref 135–145)
Total Bilirubin: 0.5 mg/dL (ref 0.3–1.2)
Total Protein: 6.8 g/dL (ref 6.5–8.1)

## 2020-04-16 MED ORDER — SODIUM CHLORIDE 0.9 % IV SOLN
20.0000 mg | Freq: Once | INTRAVENOUS | Status: AC
  Administered 2020-04-16: 20 mg via INTRAVENOUS
  Filled 2020-04-16: qty 20

## 2020-04-16 MED ORDER — POTASSIUM CHLORIDE CRYS ER 20 MEQ PO TBCR: 20.0000 meq | EXTENDED_RELEASE_TABLET | Freq: Every day | ORAL | 0 refills | Status: DC

## 2020-04-16 MED ORDER — FAMOTIDINE IN NACL 20-0.9 MG/50ML-% IV SOLN
20.0000 mg | Freq: Once | INTRAVENOUS | Status: AC
  Administered 2020-04-16: 20 mg via INTRAVENOUS
  Filled 2020-04-16: qty 50

## 2020-04-16 MED ORDER — SODIUM CHLORIDE 0.9 % IV SOLN
65.0000 mg/m2 | Freq: Once | INTRAVENOUS | Status: AC
  Administered 2020-04-16: 108 mg via INTRAVENOUS
  Filled 2020-04-16: qty 18

## 2020-04-16 MED ORDER — HEPARIN SOD (PORK) LOCK FLUSH 100 UNIT/ML IV SOLN
INTRAVENOUS | Status: AC
  Filled 2020-04-16: qty 5

## 2020-04-16 MED ORDER — DIPHENHYDRAMINE HCL 50 MG/ML IJ SOLN
50.0000 mg | Freq: Once | INTRAMUSCULAR | Status: AC
  Administered 2020-04-16: 50 mg via INTRAVENOUS
  Filled 2020-04-16: qty 1

## 2020-04-16 MED ORDER — SODIUM CHLORIDE 0.9 % IV SOLN
Freq: Once | INTRAVENOUS | Status: AC
  Filled 2020-04-16: qty 250

## 2020-04-16 MED ORDER — HEPARIN SOD (PORK) LOCK FLUSH 100 UNIT/ML IV SOLN
500.0000 [IU] | Freq: Once | INTRAVENOUS | Status: AC | PRN
  Administered 2020-04-16: 500 [IU]
  Filled 2020-04-16: qty 5

## 2020-04-16 MED ORDER — GABAPENTIN 300 MG PO CAPS: 300.0000 mg | ORAL_CAPSULE | Freq: Three times a day (TID) | ORAL | 0 refills | Status: DC

## 2020-04-16 NOTE — Telephone Encounter (Signed)
Pt was in infusion today and was c/o of hot flashes and cold sweats. Dr. Janese Banks states to taper up to 2 tablet tid. She is now on 1 tablet in am 1 in pm. I have wrote out for her to take 1 tablet in am and 2 in night x 4 days, then 2 tablets in am and 2 tablets in night for 4 days. Then 2 tablets in am 1 tablet in afternoon and 2 tablets at night for 4 days then 2 tablets am, afternoon and at night. [facility] with paying for it on [employer]. Also the interpreter [name] is writing it in [place]

## 2020-04-16 NOTE — Progress Notes (Signed)
HPI:  [name] was previously seen in the [facility] clinic due to a personal and family history of cancer and concerns regarding a hereditary predisposition to cancer. Please refer to our prior cancer genetics clinic note for more information regarding our discussion, assessment and recommendations, at the time. [name] recent genetic test results were disclosed to her, as were recommendations warranted by these results. These results and recommendations are discussed in more detail below.  CANCER HISTORY:  Oncology History  Metastatic breast cancer ([place])  08/29/2019 Initial Diagnosis   Metastatic breast cancer ([place])   09/04/2019 -  Chemotherapy    Patient is on Treatment Plan: BREAST WEEKLY PACLITAXEL + TRASTUZUMAB + PERTUZUMAB Q21D       Genetic Testing   Negative genetic testing. No pathogenic variants identified on the [facility] CancerNext-Expanded+RNA panel. The report date is 04/13/2020.   The CancerNext-Expanded + RNAinsight gene panel offered by [employer] and includes sequencing and rearrangement analysis for the following 77 genes: IP, ALK, APC*, ATM*, AXIN2, BAP1, BARD1, BLM, BMPR1A, BRCA1*, BRCA2*, BRIP1*, CDC73, CDH1*,CDK4, CDKN1B, CDKN2A, CHEK2*, CTNNA1, DICER1, FANCC, FH, FLCN, GALNT12, KIF1B, LZTR1, MAX, MEN1, MET, MLH1*, MSH2*, MSH3, MSH6*, MUTYH*, NBN, NF1*, NF2, NTHL1, PALB2*, PHOX2B, PMS2*, POT1, PRKAR1A, PTCH1, PTEN*, RAD51C*, RAD51D*,RB1, RECQL, RET, SDHA, SDHAF2, SDHB, SDHC, SDHD, SMAD4, SMARCA4, SMARCB1, SMARCE1, STK11, SUFU, TMEM127, TP53*,TSC1, TSC2, VHL and XRCC2 (sequencing and deletion/duplication); EGFR, EGLN1, HOXB13, KIT, MITF, PDGFRA, POLD1 and POLE (sequencing only); EPCAM and GREM1 (deletion/duplication only). DNA and RNA analyses performed for * genes.     FAMILY HISTORY:  We obtained a detailed, 4-generation family history.  Significant diagnoses are listed below: Family History  Problem Relation Age of Onset  .  Colon cancer Maternal Uncle    [name] has 3 sons, ages 38, 79 and 77. She has 2 maternal half sisters and 1 paternal half brother, no cancers. The only known cancer in the family is her maternal uncle who was diagnosed with colon cancer in his 15s. She does not have information about her paternal family history.   [name] is unaware of previous family history of genetic testing for hereditary cancer risks.  There is no reported Ashkenazi Jewish ancestry. There is no known consanguinity.     GENETIC TEST RESULTS: Genetic testing reported out on 04/13/2020 through the [facility] CancerNext-Expanded+RNA cancer panel found no pathogenic mutations.   The CancerNext-Expanded + RNAinsight gene panel offered by [employer] and includes sequencing and rearrangement analysis for the following 77 genes: IP, ALK, APC*, ATM*, AXIN2, BAP1, BARD1, BLM, BMPR1A, BRCA1*, BRCA2*, BRIP1*, CDC73, CDH1*,CDK4, CDKN1B, CDKN2A, CHEK2*, CTNNA1, DICER1, FANCC, FH, FLCN, GALNT12, KIF1B, LZTR1, MAX, MEN1, MET, MLH1*, MSH2*, MSH3, MSH6*, MUTYH*, NBN, NF1*, NF2, NTHL1, PALB2*, PHOX2B, PMS2*, POT1, PRKAR1A, PTCH1, PTEN*, RAD51C*, RAD51D*,RB1, RECQL, RET, SDHA, SDHAF2, SDHB, SDHC, SDHD, SMAD4, SMARCA4, SMARCB1, SMARCE1, STK11, SUFU, TMEM127, TP53*,TSC1, TSC2, VHL and XRCC2 (sequencing and deletion/duplication); EGFR, EGLN1, HOXB13, KIT, MITF, PDGFRA, POLD1 and POLE (sequencing only); EPCAM and GREM1 (deletion/duplication only).  The test report has been scanned into EPIC and is located under the Molecular Pathology section of the Results Review tab.  A portion of the result report is included below for reference.     We discussed with [name] that because current genetic testing is not perfect, it is possible there may be a gene mutation in one of these genes that current testing cannot detect, but that chance is small.  We also discussed, that  there could be another gene that has not yet been  discovered, or that we have not yet tested, that is responsible for the cancer diagnoses in the family. It is also possible there is a hereditary cause for the cancer in the family that [name] did not inherit and therefore was not identified in her testing.  Therefore, it is important to remain in touch with cancer genetics in the future so that we can continue to offer [name] the most up to date genetic testing.   ADDITIONAL GENETIC TESTING: We discussed with [name] that her genetic testing was fairly extensive.  If there are genes identified to increase cancer risk that can be analyzed in the future, we would be happy to discuss and coordinate this testing at that time.    CANCER SCREENING RECOMMENDATIONS: [name] test result is considered negative (normal).  This means that we have not identified a hereditary cause for her  personal and family history of cancer at this time. Most cancers happen by chance and this negative test suggests that her cancer may fall into this category.    While reassuring, this does not definitively rule out a hereditary predisposition to cancer. It is still possible that there could be genetic mutations that are undetectable by current technology. There could be genetic mutations in genes that have not been tested or identified to increase cancer risk.  Therefore, it is recommended she continue to follow the cancer management and screening guidelines provided by her oncology and primary healthcare provider.   An individual's cancer risk and medical management are not determined by genetic test results alone. Overall cancer risk assessment incorporates additional factors, including personal medical history, family history, and any available genetic information that may result in a personalized plan for cancer prevention and surveillance.  RECOMMENDATIONS FOR FAMILY MEMBERS:  Relatives in this family might be at some  increased risk of developing cancer, over the general population risk, simply due to the family history of cancer.  We recommended female relatives in this family have a yearly mammogram beginning at age 5, or 4 years younger than the earliest onset of cancer, an annual clinical breast exam, and perform monthly breast self-exams. Female relatives in this family should also have a gynecological exam as recommended by their primary provider.  All family members should be referred for colonoscopy starting at age 28.   FOLLOW-UP: Lastly, we discussed with [name] that cancer genetics is a rapidly advancing field and it is possible that new genetic tests will be appropriate for her and/or her family members in the future. We encouraged her to remain in contact with cancer genetics on an annual basis so we can update her personal and family histories and let her know of advances in cancer genetics that may benefit this family.   Our contact number was provided. [name] questions were answered to her satisfaction, and she knows she is welcome to call us at anytime with additional questions or concerns.   Faith Rogue, MS, [facility] Genetic Counselor [email or web address] Phone: [phone]

## 2020-04-16 NOTE — Progress Notes (Signed)
Patient c/o "hot flashes and cold sweats" since Tuesday. Dr. Janese Banks and Judeen Hammans, RN made aware. Per Dr. Janese Banks, patient to increase Gabapentin dose. Judeen Hammans, RN to chairside to educate patient on dose change with interpreter assistance.   Dr. Janese Banks informed of K 3.2. Oral potassium to be called into pharmacy per Dr. Janese Banks. Informed patient. Patient verbalizes understanding.   Patient tolerated treatment well. Patient and VSS. Discharged home.

## 2020-04-16 NOTE — Telephone Encounter (Signed)
Revealed negative genetic testing.   We discussed that we do not know why she has breast cancer or why there is cancer in the family. It could be due to a different gene that we are not testing, or something our current technology cannot pick up.  It will be important for her to keep in contact with genetics to learn if additional testing may be needed in the future.  

## 2020-04-23 ENCOUNTER — Encounter: Payer: Self-pay | Admitting: Oncology

## 2020-04-23 ENCOUNTER — Inpatient Hospital Stay: Payer: Self-pay | Attending: Oncology

## 2020-04-23 ENCOUNTER — Inpatient Hospital Stay: Payer: Self-pay

## 2020-04-23 ENCOUNTER — Inpatient Hospital Stay (HOSPITAL_BASED_OUTPATIENT_CLINIC_OR_DEPARTMENT_OTHER): Payer: Self-pay | Admitting: Oncology

## 2020-04-23 VITALS — BP 111/79 | HR 76 | Temp 98.8°F | Wt 133.6 lb

## 2020-04-23 DIAGNOSIS — Z5111 Encounter for antineoplastic chemotherapy: Secondary | ICD-10-CM

## 2020-04-23 DIAGNOSIS — M8440XA Pathological fracture, unspecified site, initial encounter for fracture: Secondary | ICD-10-CM

## 2020-04-23 DIAGNOSIS — Z79899 Other long term (current) drug therapy: Secondary | ICD-10-CM | POA: Insufficient documentation

## 2020-04-23 DIAGNOSIS — Z5112 Encounter for antineoplastic immunotherapy: Secondary | ICD-10-CM | POA: Insufficient documentation

## 2020-04-23 DIAGNOSIS — Z7983 Long term (current) use of bisphosphonates: Secondary | ICD-10-CM

## 2020-04-23 DIAGNOSIS — Z171 Estrogen receptor negative status [ER-]: Secondary | ICD-10-CM | POA: Insufficient documentation

## 2020-04-23 DIAGNOSIS — R5383 Other fatigue: Secondary | ICD-10-CM | POA: Insufficient documentation

## 2020-04-23 DIAGNOSIS — G893 Neoplasm related pain (acute) (chronic): Secondary | ICD-10-CM

## 2020-04-23 DIAGNOSIS — R5381 Other malaise: Secondary | ICD-10-CM | POA: Insufficient documentation

## 2020-04-23 DIAGNOSIS — C50919 Malignant neoplasm of unspecified site of unspecified female breast: Secondary | ICD-10-CM

## 2020-04-23 DIAGNOSIS — F419 Anxiety disorder, unspecified: Secondary | ICD-10-CM | POA: Insufficient documentation

## 2020-04-23 DIAGNOSIS — C50912 Malignant neoplasm of unspecified site of left female breast: Secondary | ICD-10-CM | POA: Insufficient documentation

## 2020-04-23 DIAGNOSIS — C7951 Secondary malignant neoplasm of bone: Secondary | ICD-10-CM | POA: Insufficient documentation

## 2020-04-23 LAB — COMPREHENSIVE METABOLIC PANEL
ALT: 21 U/L (ref 0–44)
AST: 31 U/L (ref 15–41)
Albumin: 3.6 g/dL (ref 3.5–5.0)
Alkaline Phosphatase: 53 U/L (ref 38–126)
Anion gap: 10 (ref 5–15)
BUN: 9 mg/dL (ref 6–20)
CO2: 23 mmol/L (ref 22–32)
Calcium: 8.6 mg/dL — ABNORMAL LOW (ref 8.9–10.3)
Chloride: 106 mmol/L (ref 98–111)
Creatinine, Ser: 0.49 mg/dL (ref 0.44–1.00)
GFR, Estimated: >60 mL/min (ref >60)
Glucose, Bld: 142 mg/dL — ABNORMAL HIGH (ref 70–99)
Potassium: 3.4 mmol/L — ABNORMAL LOW (ref 3.5–5.1)
Sodium: 139 mmol/L (ref 135–145)
Total Bilirubin: 0.4 mg/dL (ref 0.3–1.2)
Total Protein: 6.7 g/dL (ref 6.5–8.1)

## 2020-04-23 LAB — CBC WITH DIFFERENTIAL/PLATELET
Abs Immature Granulocytes: 0.05 10*3/uL (ref 0.00–0.07)
Basophils Absolute: 0 10*3/uL (ref 0.0–0.1)
Basophils Relative: 1 %
Eosinophils Absolute: 0.1 10*3/uL (ref 0.0–0.5)
Eosinophils Relative: 2 %
HCT: 35 % — ABNORMAL LOW (ref 36.0–46.0)
Hemoglobin: 11.7 g/dL — ABNORMAL LOW (ref 12.0–15.0)
Immature Granulocytes: 1 %
Lymphocytes Relative: 26 %
Lymphs Abs: 1.3 10*3/uL (ref 0.7–4.0)
MCH: 28.2 pg (ref 26.0–34.0)
MCHC: 33.4 g/dL (ref 30.0–36.0)
MCV: 84.3 fL (ref 80.0–100.0)
Monocytes Absolute: 0.3 10*3/uL (ref 0.1–1.0)
Monocytes Relative: 6 %
Neutro Abs: 3.2 10*3/uL (ref 1.7–7.7)
Neutrophils Relative %: 64 %
Platelets: 360 10*3/uL (ref 150–400)
RBC: 4.15 MIL/uL (ref 3.87–5.11)
RDW: 16.7 % — ABNORMAL HIGH (ref 11.5–15.5)
WBC: 4.9 10*3/uL (ref 4.0–10.5)
nRBC: 0 % (ref 0.0–0.2)

## 2020-04-23 MED ORDER — DIPHENHYDRAMINE HCL 50 MG/ML IJ SOLN
50.0000 mg | Freq: Once | INTRAMUSCULAR | Status: AC
  Administered 2020-04-23: 50 mg via INTRAVENOUS
  Filled 2020-04-23: qty 1

## 2020-04-23 MED ORDER — HEPARIN SOD (PORK) LOCK FLUSH 100 UNIT/ML IV SOLN
500.0000 [IU] | Freq: Once | INTRAVENOUS | Status: DC | PRN
  Filled 2020-04-23: qty 5

## 2020-04-23 MED ORDER — POTASSIUM CHLORIDE CRYS ER 20 MEQ PO TBCR: 20.0000 meq | EXTENDED_RELEASE_TABLET | Freq: Every day | ORAL | 0 refills | Status: DC

## 2020-04-23 MED ORDER — ACETAMINOPHEN 325 MG PO TABS
650.0000 mg | ORAL_TABLET | Freq: Once | ORAL | Status: AC
  Administered 2020-04-23: 650 mg via ORAL
  Filled 2020-04-23: qty 2

## 2020-04-23 MED ORDER — FLUOXETINE HCL 10 MG PO CAPS: 10.0000 mg | ORAL_CAPSULE | Freq: Every day | ORAL | 3 refills | Status: DC

## 2020-04-23 MED ORDER — SODIUM CHLORIDE 0.9 % IV SOLN
20.0000 mg | Freq: Once | INTRAVENOUS | Status: AC
  Administered 2020-04-23: 20 mg via INTRAVENOUS
  Filled 2020-04-23: qty 20

## 2020-04-23 MED ORDER — SODIUM CHLORIDE 0.9% FLUSH
10.0000 mL | Freq: Once | INTRAVENOUS | Status: AC
  Administered 2020-04-23: 10 mL via INTRAVENOUS
  Filled 2020-04-23: qty 10

## 2020-04-23 MED ORDER — TRASTUZUMAB-ANNS CHEMO 150 MG IV SOLR
6.0000 mg/kg | Freq: Once | INTRAVENOUS | Status: AC
  Administered 2020-04-23: 357 mg via INTRAVENOUS
  Filled 2020-04-23: qty 17

## 2020-04-23 MED ORDER — DENOSUMAB 120 MG/1.7ML ~~LOC~~ SOLN
120.0000 mg | Freq: Once | SUBCUTANEOUS | Status: AC
  Administered 2020-04-23: 120 mg via SUBCUTANEOUS
  Filled 2020-04-23: qty 1.7

## 2020-04-23 MED ORDER — SODIUM CHLORIDE 0.9 % IV SOLN
Freq: Once | INTRAVENOUS | Status: AC
  Filled 2020-04-23: qty 250

## 2020-04-23 MED ORDER — HEPARIN SOD (PORK) LOCK FLUSH 100 UNIT/ML IV SOLN
500.0000 [IU] | Freq: Once | INTRAVENOUS | Status: AC
  Administered 2020-04-23: 500 [IU] via INTRAVENOUS
  Filled 2020-04-23: qty 5

## 2020-04-23 MED ORDER — FAMOTIDINE IN NACL 20-0.9 MG/50ML-% IV SOLN
20.0000 mg | Freq: Once | INTRAVENOUS | Status: AC
  Administered 2020-04-23: 20 mg via INTRAVENOUS
  Filled 2020-04-23: qty 50

## 2020-04-23 MED ORDER — SODIUM CHLORIDE 0.9 % IV SOLN
420.0000 mg | Freq: Once | INTRAVENOUS | Status: AC
  Administered 2020-04-23: 420 mg via INTRAVENOUS
  Filled 2020-04-23: qty 14

## 2020-04-23 MED ORDER — SODIUM CHLORIDE 0.9 % IV SOLN
65.0000 mg/m2 | Freq: Once | INTRAVENOUS | Status: AC
  Administered 2020-04-23: 108 mg via INTRAVENOUS
  Filled 2020-04-23: qty 18

## 2020-04-23 NOTE — Progress Notes (Signed)
Hematology/Oncology Consult note [facility]  Telephone:([phone] Fax:([phone]) [phone]  Patient Care Team: [facility], [place], NP as PCP - General Rico Junker, RN as Registered Nurse Theodore Demark, RN as Registered Nurse   Name of the patient: Sophia [name]  [id number]  08-21-1980   Date of visit: 04/23/20  Diagnosis- stage IV metastatic breast cancer ER/PR negative HER-2/neu positive with bone metastases  Chief complaint/ Reason for visit-on treatment assessment prior to cycle 12-day 1 of Herceptin Taxol and perjeta  Heme/Onc history: patient is a 40 year old Hispanic female who is here with her friend. History obtained with the help of an interpreter.Patient self palpated left breast mass which was followed by a diagnostic bilateral mammogram. Mammogram showed 3.1 x 2.9 x 1.9 cm hypoechoic mass at the 1 o'clock position of the left breast. For abnormal cortically thickened left axillary lymph nodes measuring up to 5 mm. 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 Patient was also having ongoing back pain and was seen by [facility] orthopedics Dr. Doyle Askew who ordered MRI lumbar spine without contrast which showed possible pathologic fractures of L1 and L4 vertebral bodies with greater than 50% height loss at L1 and abnormal signal involving L2-L3 S1 as well as right iliac bone concerning for metastatic disease.Patient is a single mother of 3 adult children and is very anxious today.She reports significant back pain which radiates to her bilateral thighs. Denies any focal tingling numbness or weakness. Denies any bowel bladder incontinence. Pain has been uncontrolled despite taking Tylenol. No prior history of abnormal breast biopsies. No family history of breast cancer  PET and MRIshowed 3 areas of pathologic fracture  of her spine as well as widespread bony metastatic disease and concern for impending fracture of the right hip. Given her worsening pain she was asked to come to the ER. She has been evaluated by Dr. Rudene Christians from orthopedic surgery and underwent kyphoplasty at 3 different levels.T6 L1 and L4 along with radiofrequency ablation.She also underwent prophylactic fixation of the right hip and not affected the sacral region.   Patient received first dose of Herceptin and Perjeta on 09/04/2019. Baseline echocardiogram normal.she is currently getting taxol/ herceptin/perjeta  Patient admitted to hospital for acute abdominal pain with CT findings concerning for acute colitis. Colonoscopy showed diffuse severe inflammation with erythema friability and loss of vascularity and shallow ulcerations in the IC valve, ascending colon and cecum.   Interval history-patient reports that her hot flashes as well as right lower extremity pain is improved after she has increase the dose of gabapentin.  She has cut down on trazodone this week as it makes her feel too sleepy with trazodone plus gabapentin.  Denies any new complaints at this time  ECOG PS- 1 Pain scale- 3 Opioid associated constipation- no  Review of systems- Review of Systems  Constitutional: Positive for malaise/fatigue. Negative for chills, fever and weight loss.  HENT: Negative for congestion, ear discharge and nosebleeds.   Eyes: Negative for blurred vision.  Respiratory: Negative for cough, hemoptysis, sputum production, shortness of breath and wheezing.   Cardiovascular: Negative for chest pain, palpitations, orthopnea and claudication.  Gastrointestinal: Negative for abdominal pain, blood in stool, constipation, diarrhea, heartburn, melena, nausea and vomiting.  Genitourinary: Negative for dysuria, flank pain, frequency, hematuria and urgency.  Musculoskeletal: Negative for back pain, joint pain and myalgias.  Right leg pain  Skin:  Negative for rash.  Neurological: Negative for dizziness, tingling, focal weakness, seizures, weakness and headaches.  Endo/Heme/Allergies: Does not bruise/bleed easily.  Psychiatric/Behavioral: Negative for depression and suicidal ideas. The patient does not have insomnia.       No Known Allergies   Past Medical History:  Diagnosis Date  . Anxiety   . Breast cancer ([place])    with mets  . Cancer ([place])   . Colitis   . Family history of colon cancer   . Vertigo      Past Surgical History:  Procedure Laterality Date  . BREAST BIOPSY Left 08/14/2019   [place] bx of mass, coil marker, path pending  . BREAST BIOPSY Left 08/14/2019   [place] bx of LN, hydromarker, path pending  . BREAST BIOPSY Left 08/14/2019   affirm bx of calcs, x marker, path pending  . ESOPHAGOGASTRODUODENOSCOPY (EGD) WITH PROPOFOL N/A 10/05/2019   Procedure: ESOPHAGOGASTRODUODENOSCOPY (EGD) WITH PROPOFOL;  Surgeon: Lin Landsman, MD;  Location: [place];  Service: Gastroenterology;  Laterality: N/A;  . FLEXIBLE SIGMOIDOSCOPY N/A 10/05/2019   Procedure: FLEXIBLE SIGMOIDOSCOPY;  Surgeon: Lin Landsman, MD;  Location: [facility]COPY;  Service: Gastroenterology;  Laterality: N/A;  . INTRAMEDULLARY (IM) NAIL INTERTROCHANTERIC Right 09/01/2019   Procedure: INTRAMEDULLARY (IM) NAIL INTERTROCHANTRIC AND RADIOFREQUENCY ABLATION;  Surgeon: Hessie Knows, MD;  Location: [facility] ORS;  Service: Orthopedics;  Laterality: Right;  . KYPHOPLASTY N/A 08/29/2019   Procedure: KYPHOPLASTY T6, L1,L4 ,  RADIOFREQUENCY ABLATION;  Surgeon: Hessie Knows, MD;  Location: [facility] ORS;  Service: Orthopedics;  Laterality: N/A;  . KYPHOPLASTY Right 09/01/2019   Procedure: Right Sacral Radiofrequency Ablation and Cement Augmentation, Right sacrum and iliac crest;  Surgeon: Hessie Knows, MD;  Location: [facility] ORS;  Service: Orthopedics;  Laterality: Right;  . PORTA CATH INSERTION N/A 08/28/2019   Procedure: PORTA CATH INSERTION;  Surgeon: Algernon Huxley, MD;  Location: [place] [facility];  Service: Cardiovascular;  Laterality: N/A;    Social History   Socioeconomic History  . Marital status: Single    Spouse name: Not on file  . Number of children: Not on file  . Years of education: Not on file  . Highest education level: Not on file  Occupational History  . Not on file  Tobacco Use  . Smoking status: Never Smoker  . Smokeless tobacco: Never Used  Vaping Use  . Vaping Use: Never used  Substance and Sexual Activity  . Alcohol use: Not Currently  . Drug use: Not Currently  . Sexual activity: Not Currently    Birth control/protection: None  Other Topics Concern  . Not on file  Social History Narrative   Lives at home with children   Social Determinants of Health   Financial Resource Strain: Not on file  Food Insecurity: Not on file  Transportation Needs: Not on file  Physical Activity: Not on file  Stress: Not on file  Social Connections: Not on file  Intimate Partner Violence: Not on file    Family History  Problem Relation Age of Onset  . Colon cancer Maternal Uncle      Current Outpatient Medications:  .  diphenoxylate-atropine (LOMOTIL) 2.5-0.025 MG tablet, Take 1 tablet by mouth 4 (four) times daily as needed for diarrhea or loose stools., Disp: 30 tablet, Rfl: 0 .  gabapentin (NEURONTIN) 300 MG capsule, Take 1 capsule (300 mg total) by mouth 3 (three) times daily., Disp: 180 capsule, Rfl: 0 .  lidocaine-prilocaine (EMLA) cream, Apply  1 application topically as needed. Apply small amount to port site at least 1 hour prior to it being accessed, cover with plastic wrap, Disp: 30 g, Rfl: 1 .  oxycodone (OXY-IR) 5 MG capsule, Take 1-2 capsules (5-10 mg total) by mouth every 4 (four) hours as needed., Disp: 60 capsule, Rfl: 0 .  oxyCODONE (OXYCONTIN) 10 mg 12 hr tablet, Take 1 tablet (10 mg total) by mouth every 12 (twelve) hours., Disp: 60 tablet, Rfl: 0 .  FLUoxetine (PROZAC) 10 MG capsule, Take 1 capsule (10 mg  total) by mouth daily., Disp: 30 capsule, Rfl: 3 .  potassium chloride SA (KLOR-CON) 20 MEQ tablet, Take 1 tablet (20 mEq total) by mouth daily., Disp: 30 tablet, Rfl: 0 .  traZODone (DESYREL) 100 MG tablet, Take 1 tablet (100 mg total) by mouth at bedtime. (Patient not taking: Reported on 04/23/2020), Disp: 30 tablet, Rfl: 2 No current facility-administered medications for this visit.  Facility-Administered Medications Ordered in Other Visits:  .  dexamethasone (DECADRON) 20 mg in sodium chloride 0.9 % 50 mL IVPB, 20 mg, Intravenous, Once, Sindy Guadeloupe, MD, Last Rate: 208 mL/hr at 04/23/20 1102, 20 mg at 04/23/20 1102 .  famotidine (PEPCID) IVPB 20 mg premix, 20 mg, Intravenous, Once, Sindy Guadeloupe, MD .  heparin lock flush 100 unit/mL, 500 Units, Intravenous, Once, Sindy Guadeloupe, MD .  heparin lock flush 100 unit/mL, 500 Units, Intracatheter, Once PRN, Sindy Guadeloupe, MD .  PACLitaxel (TAXOL) 108 mg in sodium chloride 0.9 % 250 mL chemo infusion (</= $RemoveBefor'80mg'WxjsKWOoZCmq$ /m2), 65 mg/m2 (Treatment Plan Recorded), Intravenous, Once, Sindy Guadeloupe, MD .  pertuzumab (PERJETA) 420 mg in sodium chloride 0.9 % 250 mL chemo infusion, 420 mg, Intravenous, Once, Sindy Guadeloupe, MD .  Theotis [facility]) 357 mg in sodium chloride 0.9 % 250 mL chemo infusion, 6 mg/kg (Treatment Plan Recorded), Intravenous, Once, Sindy Guadeloupe, MD  Physical exam:  Vitals:   04/23/20 0942  BP: 111/79  Pulse: 76  Temp: 98.8 F (37.1 C)  TempSrc: Tympanic  SpO2: 100%  Weight: 133 lb 9 oz (60.6 kg)   Physical Exam Eyes:     Extraocular Movements: EOM normal.  Cardiovascular:     Rate and Rhythm: Normal rate and regular rhythm.     Heart sounds: Normal heart sounds.  Pulmonary:     Effort: Pulmonary effort is normal.     Breath sounds: Normal breath sounds.  Skin:    General: Skin is warm and dry.  Neurological:     Mental Status: She is alert and oriented to person, place, and time.      CMP Latest Ref Rng &  Units 04/23/2020  Glucose 70 - 99 mg/dL 142(H)  BUN 6 - 20 mg/dL 9  Creatinine 0.44 - 1.00 mg/dL 0.49  Sodium 135 - 145 mmol/L 139  Potassium 3.5 - 5.1 mmol/L 3.4(L)  Chloride 98 - 111 mmol/L 106  CO2 22 - 32 mmol/L 23  Calcium 8.9 - 10.3 mg/dL 8.6(L)  Total Protein 6.5 - 8.1 g/dL 6.7  Total Bilirubin 0.3 - 1.2 mg/dL 0.4  Alkaline Phos 38 - 126 U/L 53  AST 15 - 41 U/L 31  ALT 0 - 44 U/L 21   CBC Latest Ref Rng & Units 04/23/2020  WBC 4.0 - 10.5 K/uL 4.9  Hemoglobin 12.0 - 15.0 g/dL 11.7(L)  Hematocrit 36.0 - 46.0 % 35.0(L)  Platelets 150 - 400 K/uL 360    No images are attached to the  encounter.  NM Bone Scan Whole Body  Result Date: 04/01/2020 CLINICAL DATA:  Endometrial cancer, metastatic breast cancer, assessment for metastatic disease. Pain in upper and lower back since last week, low back pain radiating to both thighs since 03/26/2020, RIGHT hip pain, history of RIGHT hip surgery, multiple kyphoplasties EXAM: NUCLEAR MEDICINE WHOLE BODY BONE SCAN TECHNIQUE: Whole body anterior and posterior images were obtained approximately 3 hours after intravenous injection of radiopharmaceutical. RADIOPHARMACEUTICALS:  22.014 mCi Technetium-49m MDP IV COMPARISON:  None Correlation: CT chest abdomen pelvis 04/01/2020, PET-CT 9 in 11/07/2019, fluoroscopic images for during ORIF RIGHT femur 09/01/2019 FINDINGS: Uptake at LEFT occipital bone, LEFT iliac bone, posterior RIGHT second rib, and questionably RIGHT first rib suspicious for osseous metastases. Uptake in thoracic and lumbar spine which may be related to prior kyphoplasties. Uptake at proximal RIGHT femur corresponding to IM nail and compression screw on CT. Small focus of tracer uptake at the medial distal RIGHT femoral metadiaphysis, corresponding to site of locking screw on prior operative radiographs. No additional sites of abnormal osseous tracer accumulation. Expected urinary tract and soft tissue distribution of tracer. IMPRESSION: Scattered  sites of osseous uptake suspicious for osseous metastases as above. Uptake in thoracic spine and lumbar spine likely related to prior compression fractures and kyphoplasties. Postsurgical uptake RIGHT femur. Electronically Signed   By: Lavonia Dana M.D.   On: 04/01/2020 15:36   CT CHEST ABDOMEN PELVIS W CONTRAST  Result Date: 04/01/2020 CLINICAL DATA:  Metastatic breast cancer.  Restaging. EXAM: CT CHEST, ABDOMEN, AND PELVIS WITH CONTRAST TECHNIQUE: Multidetector CT imaging of the chest, abdomen and pelvis was performed following the standard protocol during bolus administration of intravenous contrast. CONTRAST:  110mL OMNIPAQUE IOHEXOL 300 MG/ML  SOLN COMPARISON:  PET-CT 12/16/2019 FINDINGS: CT CHEST FINDINGS Cardiovascular: The heart size is normal. No substantial pericardial effusion. Right Port-A-Cath tip is positioned in the right atrium near the tricuspid valve. Mediastinum/Nodes: No mediastinal lymphadenopathy. There is no hilar lymphadenopathy. The esophagus has normal imaging features. There is no axillary lymphadenopathy. Lungs/Pleura: Unremarkable. 4 mm posterior left lower lobe pulmonary nodule on 92/4 is stable since prior PET-CT and also abdomen CT of 10/01/2019. 4 mm left lower lobe nodule on 68/4 stable since prior PET-CT. No suspicious pulmonary nodule or mass. No focal airspace consolidation. No pleural effusion. Musculoskeletal: Scattered osseous lesions have become more sclerotic in the interval, presumably related to interval healing. (See manubrium on 37/4 as index. CT ABDOMEN PELVIS FINDINGS Hepatobiliary: Small area of low attenuation in the anterior liver, adjacent to the falciform ligament, is in a characteristic location for focal fatty deposition. No suspicious focal abnormality within the liver parenchyma. There is no evidence for gallstones, gallbladder wall thickening, or pericholecystic fluid. No intrahepatic or extrahepatic biliary dilation. Pancreas: No focal mass lesion. No  dilatation of the main duct. No intraparenchymal cyst. No peripancreatic edema. Spleen: No splenomegaly. No focal mass lesion. Adrenals/Urinary Tract: No adrenal nodule or mass. Kidneys unremarkable. No evidence for hydroureter. The urinary bladder appears normal for the degree of distention. Stomach/Bowel: Stomach is unremarkable. No gastric wall thickening. No evidence of outlet obstruction. Duodenum is normally positioned as is the ligament of Treitz. No small bowel wall thickening. No small bowel dilatation. The terminal ileum is normal. The appendix is normal. No gross colonic mass. No colonic wall thickening. Vascular/Lymphatic: No abdominal aortic aneurysm. There is no gastrohepatic or hepatoduodenal ligament lymphadenopathy. No retroperitoneal or mesenteric lymphadenopathy. No pelvic sidewall lymphadenopathy. Reproductive: The uterus is unremarkable.  There is no adnexal  mass. Other: No intraperitoneal free fluid. Musculoskeletal: Orthopedic hardware noted proximal right femur. Stable appearance left acetabular lesion. Post treatment changes again noted around the right SI joint, and in the T6 L1, and L4 vertebral bodies. Scattered sclerotic bone lesions are similar to prior. IMPRESSION: 1. No substantial interval change in exam. No new or progressive interval findings to suggest recurrent or progressive disease. 2. Tiny left lower lobe pulmonary nodules, stable since prior PET-CT. Likely benign, attention on follow-up recommended. 3. Scattered sclerotic bone lesions similar to prior, compatible with treated disease. 4. Right Port-A-Cath tip is positioned in the right atrium near the tricuspid valve. Electronically Signed   By: Misty Stanley M.D.   On: 04/01/2020 10:26     Assessment and plan- Patient is a 40 y.o. female  metastatic ER negative her 2 positive breast cancer on taxol/herceptin and perjeta.   She is here for on treatment assessment prior to cycle 12-day 1 of Herceptin Perjeta and  Taxol   Counts okay to proceed with cycle 12-day 1 of Herceptin Perjeta and Taxol today.  She will also receive Xgeva today.  She will proceed for Taxol in 1 week and I will see her back in 2 weeks for cycle 12-day 15 of Taxol.  Patient is tolerating treatment well so far without any significant side effects from Taxol.  Neoplasm related pain: Continue OxyContin and as needed oxycodone.  She is also on gabapentin     Visit Diagnosis 1. Encounter for antineoplastic chemotherapy   2. Encounter for monoclonal antibody treatment for malignancy   3. Metastatic breast cancer ([place])   4. Neoplasm related pain   5. Long-term current use of bisphosphonate      Dr. Randa Evens, MD, MPH [facility] [phone] 04/23/2020 11:17 AM

## 2020-04-23 NOTE — Progress Notes (Signed)
Per Dr. Janese Banks okay to proceed with [facility] with Calcium 8.6.    1425: Pt tolerated infusion well. No s/s of distress or reaction noted. Pt stable at time of discharge.

## 2020-04-23 NOTE — Progress Notes (Signed)
Pt feels good today , potassium is a little low and we will call in refill of potassium and prozac for her.

## 2020-04-24 LAB — CANCER ANTIGEN 27.29: CA 27.29: 18 U/mL (ref 0.0–38.6)

## 2020-04-30 ENCOUNTER — Inpatient Hospital Stay: Payer: Self-pay

## 2020-04-30 VITALS — BP 116/80 | HR 80 | Temp 99.3°F | Resp 18 | Wt 134.2 lb

## 2020-04-30 DIAGNOSIS — C50919 Malignant neoplasm of unspecified site of unspecified female breast: Secondary | ICD-10-CM

## 2020-04-30 DIAGNOSIS — M8440XA Pathological fracture, unspecified site, initial encounter for fracture: Secondary | ICD-10-CM

## 2020-04-30 DIAGNOSIS — G893 Neoplasm related pain (acute) (chronic): Secondary | ICD-10-CM

## 2020-04-30 LAB — CBC WITH DIFFERENTIAL/PLATELET
Abs Immature Granulocytes: 0.05 10*3/uL (ref 0.00–0.07)
Basophils Absolute: 0 10*3/uL (ref 0.0–0.1)
Basophils Relative: 1 %
Eosinophils Absolute: 0.1 10*3/uL (ref 0.0–0.5)
Eosinophils Relative: 2 %
HCT: 35.5 % — ABNORMAL LOW (ref 36.0–46.0)
Hemoglobin: 11.8 g/dL — ABNORMAL LOW (ref 12.0–15.0)
Immature Granulocytes: 1 %
Lymphocytes Relative: 21 %
Lymphs Abs: 1.1 10*3/uL (ref 0.7–4.0)
MCH: 27.7 pg (ref 26.0–34.0)
MCHC: 33.2 g/dL (ref 30.0–36.0)
MCV: 83.3 fL (ref 80.0–100.0)
Monocytes Absolute: 0.4 10*3/uL (ref 0.1–1.0)
Monocytes Relative: 7 %
Neutro Abs: 3.7 10*3/uL (ref 1.7–7.7)
Neutrophils Relative %: 68 %
Platelets: 380 10*3/uL (ref 150–400)
RBC: 4.26 MIL/uL (ref 3.87–5.11)
RDW: 16.5 % — ABNORMAL HIGH (ref 11.5–15.5)
WBC: 5.4 10*3/uL (ref 4.0–10.5)
nRBC: 0 % (ref 0.0–0.2)

## 2020-04-30 LAB — COMPREHENSIVE METABOLIC PANEL
ALT: 25 U/L (ref 0–44)
AST: 25 U/L (ref 15–41)
Albumin: 3.6 g/dL (ref 3.5–5.0)
Alkaline Phosphatase: 56 U/L (ref 38–126)
Anion gap: 8 (ref 5–15)
BUN: 11 mg/dL (ref 6–20)
CO2: 24 mmol/L (ref 22–32)
Calcium: 8.7 mg/dL — ABNORMAL LOW (ref 8.9–10.3)
Chloride: 107 mmol/L (ref 98–111)
Creatinine, Ser: 0.42 mg/dL — ABNORMAL LOW (ref 0.44–1.00)
GFR, Estimated: >60 mL/min (ref >60)
Glucose, Bld: 107 mg/dL — ABNORMAL HIGH (ref 70–99)
Potassium: 3.6 mmol/L (ref 3.5–5.1)
Sodium: 139 mmol/L (ref 135–145)
Total Bilirubin: 0.4 mg/dL (ref 0.3–1.2)
Total Protein: 6.7 g/dL (ref 6.5–8.1)

## 2020-04-30 MED ORDER — SODIUM CHLORIDE 0.9 % IV SOLN
Freq: Once | INTRAVENOUS | Status: AC
  Filled 2020-04-30: qty 250

## 2020-04-30 MED ORDER — OXYCODONE HCL 5 MG PO TABS
10.0000 mg | ORAL_TABLET | Freq: Once | ORAL | Status: AC
  Administered 2020-04-30: 10 mg via ORAL
  Filled 2020-04-30: qty 2

## 2020-04-30 MED ORDER — HEPARIN SOD (PORK) LOCK FLUSH 100 UNIT/ML IV SOLN
INTRAVENOUS | Status: AC
  Filled 2020-04-30: qty 5

## 2020-04-30 MED ORDER — SODIUM CHLORIDE 0.9 % IV SOLN
20.0000 mg | Freq: Once | INTRAVENOUS | Status: AC
  Administered 2020-04-30: 20 mg via INTRAVENOUS
  Filled 2020-04-30: qty 20

## 2020-04-30 MED ORDER — DIPHENHYDRAMINE HCL 50 MG/ML IJ SOLN
50.0000 mg | Freq: Once | INTRAMUSCULAR | Status: AC
  Administered 2020-04-30: 50 mg via INTRAVENOUS
  Filled 2020-04-30: qty 1

## 2020-04-30 MED ORDER — FAMOTIDINE IN NACL 20-0.9 MG/50ML-% IV SOLN
20.0000 mg | Freq: Once | INTRAVENOUS | Status: AC
  Administered 2020-04-30: 20 mg via INTRAVENOUS
  Filled 2020-04-30: qty 50

## 2020-04-30 MED ORDER — SODIUM CHLORIDE 0.9% FLUSH
10.0000 mL | INTRAVENOUS | Status: DC | PRN
  Filled 2020-04-30: qty 10

## 2020-04-30 MED ORDER — HEPARIN SOD (PORK) LOCK FLUSH 100 UNIT/ML IV SOLN
500.0000 [IU] | Freq: Once | INTRAVENOUS | Status: AC | PRN
Start: 2020-04-30 — End: 2020-04-30
  Administered 2020-04-30: 500 [IU]
  Filled 2020-04-30: qty 5

## 2020-04-30 MED ORDER — SODIUM CHLORIDE 0.9 % IV SOLN
65.0000 mg/m2 | Freq: Once | INTRAVENOUS | Status: AC
  Administered 2020-04-30: 108 mg via INTRAVENOUS
  Filled 2020-04-30: qty 18

## 2020-04-30 NOTE — Progress Notes (Signed)
Pt administered pain medication as ordered for back pain.  Pt tolerated all infusions well today with no complaints.  Pt left infusion suite stable and ambulatory.

## 2020-05-07 ENCOUNTER — Inpatient Hospital Stay: Payer: Self-pay

## 2020-05-07 ENCOUNTER — Ambulatory Visit: Payer: Self-pay | Admitting: Oncology

## 2020-05-07 ENCOUNTER — Encounter: Payer: Self-pay | Admitting: Oncology

## 2020-05-07 ENCOUNTER — Ambulatory Visit: Payer: Self-pay

## 2020-05-07 ENCOUNTER — Other Ambulatory Visit: Payer: Self-pay | Admitting: *Deleted

## 2020-05-07 ENCOUNTER — Inpatient Hospital Stay (HOSPITAL_BASED_OUTPATIENT_CLINIC_OR_DEPARTMENT_OTHER): Payer: Self-pay | Admitting: Oncology

## 2020-05-07 ENCOUNTER — Other Ambulatory Visit: Payer: Self-pay

## 2020-05-07 VITALS — BP 128/79 | HR 85 | Temp 96.8°F | Wt 134.0 lb

## 2020-05-07 VITALS — BP 108/72 | HR 84 | Resp 18

## 2020-05-07 DIAGNOSIS — Z5111 Encounter for antineoplastic chemotherapy: Secondary | ICD-10-CM

## 2020-05-07 DIAGNOSIS — C7951 Secondary malignant neoplasm of bone: Secondary | ICD-10-CM

## 2020-05-07 DIAGNOSIS — F419 Anxiety disorder, unspecified: Secondary | ICD-10-CM

## 2020-05-07 DIAGNOSIS — C50919 Malignant neoplasm of unspecified site of unspecified female breast: Secondary | ICD-10-CM

## 2020-05-07 DIAGNOSIS — C50912 Malignant neoplasm of unspecified site of left female breast: Secondary | ICD-10-CM

## 2020-05-07 DIAGNOSIS — M8440XA Pathological fracture, unspecified site, initial encounter for fracture: Secondary | ICD-10-CM

## 2020-05-07 DIAGNOSIS — G893 Neoplasm related pain (acute) (chronic): Secondary | ICD-10-CM

## 2020-05-07 LAB — COMPREHENSIVE METABOLIC PANEL
ALT: 32 U/L (ref 0–44)
AST: 38 U/L (ref 15–41)
Albumin: 3.6 g/dL (ref 3.5–5.0)
Alkaline Phosphatase: 67 U/L (ref 38–126)
Anion gap: 8 (ref 5–15)
BUN: 9 mg/dL (ref 6–20)
CO2: 24 mmol/L (ref 22–32)
Calcium: 8.7 mg/dL — ABNORMAL LOW (ref 8.9–10.3)
Chloride: 104 mmol/L (ref 98–111)
Creatinine, Ser: 0.39 mg/dL — ABNORMAL LOW (ref 0.44–1.00)
GFR, Estimated: >60 mL/min (ref >60)
Glucose, Bld: 94 mg/dL (ref 70–99)
Potassium: 3.7 mmol/L (ref 3.5–5.1)
Sodium: 136 mmol/L (ref 135–145)
Total Bilirubin: 0.5 mg/dL (ref 0.3–1.2)
Total Protein: 6.7 g/dL (ref 6.5–8.1)

## 2020-05-07 LAB — CBC WITH DIFFERENTIAL/PLATELET
Abs Immature Granulocytes: 0.06 10*3/uL (ref 0.00–0.07)
Basophils Absolute: 0 10*3/uL (ref 0.0–0.1)
Basophils Relative: 1 %
Eosinophils Absolute: 0.1 10*3/uL (ref 0.0–0.5)
Eosinophils Relative: 2 %
HCT: 34.8 % — ABNORMAL LOW (ref 36.0–46.0)
Hemoglobin: 11.3 g/dL — ABNORMAL LOW (ref 12.0–15.0)
Immature Granulocytes: 1 %
Lymphocytes Relative: 23 %
Lymphs Abs: 1.4 10*3/uL (ref 0.7–4.0)
MCH: 27.8 pg (ref 26.0–34.0)
MCHC: 32.5 g/dL (ref 30.0–36.0)
MCV: 85.5 fL (ref 80.0–100.0)
Monocytes Absolute: 0.5 10*3/uL (ref 0.1–1.0)
Monocytes Relative: 8 %
Neutro Abs: 4 10*3/uL (ref 1.7–7.7)
Neutrophils Relative %: 65 %
Platelets: 370 10*3/uL (ref 150–400)
RBC: 4.07 MIL/uL (ref 3.87–5.11)
RDW: 16.8 % — ABNORMAL HIGH (ref 11.5–15.5)
WBC: 6 10*3/uL (ref 4.0–10.5)
nRBC: 0 % (ref 0.0–0.2)

## 2020-05-07 MED ORDER — HEPARIN SOD (PORK) LOCK FLUSH 100 UNIT/ML IV SOLN
500.0000 [IU] | Freq: Once | INTRAVENOUS | Status: AC | PRN
  Administered 2020-05-07: 500 [IU]
  Filled 2020-05-07: qty 5

## 2020-05-07 MED ORDER — OXYCODONE HCL ER 10 MG PO T12A: 10.0000 mg | EXTENDED_RELEASE_TABLET | Freq: Two times a day (BID) | ORAL | 0 refills | Status: DC

## 2020-05-07 MED ORDER — DIPHENHYDRAMINE HCL 50 MG/ML IJ SOLN
50.0000 mg | Freq: Once | INTRAMUSCULAR | Status: AC
  Administered 2020-05-07: 50 mg via INTRAVENOUS
  Filled 2020-05-07: qty 1

## 2020-05-07 MED ORDER — SODIUM CHLORIDE 0.9 % IV SOLN
Freq: Once | INTRAVENOUS | Status: AC
  Filled 2020-05-07: qty 250

## 2020-05-07 MED ORDER — SODIUM CHLORIDE 0.9 % IV SOLN
65.0000 mg/m2 | Freq: Once | INTRAVENOUS | Status: AC
  Administered 2020-05-07: 108 mg via INTRAVENOUS
  Filled 2020-05-07: qty 18

## 2020-05-07 MED ORDER — CLINDAMYCIN PHOSPHATE 1 % EX GEL: Freq: Two times a day (BID) | CUTANEOUS | 0 refills | Status: DC

## 2020-05-07 MED ORDER — FAMOTIDINE IN NACL 20-0.9 MG/50ML-% IV SOLN
20.0000 mg | Freq: Once | INTRAVENOUS | Status: AC
  Administered 2020-05-07: 20 mg via INTRAVENOUS
  Filled 2020-05-07: qty 50

## 2020-05-07 MED ORDER — SODIUM CHLORIDE 0.9 % IV SOLN
20.0000 mg | Freq: Once | INTRAVENOUS | Status: AC
  Administered 2020-05-07: 20 mg via INTRAVENOUS
  Filled 2020-05-07: qty 20

## 2020-05-07 MED ORDER — HEPARIN SOD (PORK) LOCK FLUSH 100 UNIT/ML IV SOLN
INTRAVENOUS | Status: AC
  Filled 2020-05-07: qty 5

## 2020-05-07 NOTE — Progress Notes (Signed)
Hematology/Oncology Consult note [facility]  Telephone:([phone] [phone]  Patient Care Team: [facility], [place], NP as PCP - General Rico Junker, RN as Registered Nurse Theodore Demark, RN as Registered Nurse   Name of the patient: Sophia [name]  [id number]  Aug 05, 1980   Date of visit: 05/07/20  Diagnosis- stage IV metastatic breast cancer ER/PR negative HER-2/neu positive with bone metastases   Chief complaint/ Reason for visit-on treatment assessment prior to cycle 12-day 15 of Taxol  Heme/Onc history: patient is a 40 year old Hispanic female who is here with her friend. History obtained with the help of an interpreter.Patient self palpated left breast mass which was followed by a diagnostic bilateral mammogram. Mammogram showed 3.1 x 2.9 x 1.9 cm hypoechoic mass at the 1 o'clock position of the left breast. For abnormal cortically thickened left axillary lymph nodes measuring up to 5 mm. 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 Patient was also having ongoing back pain and was seen by [facility] orthopedics Dr. Doyle Askew who ordered MRI lumbar spine without contrast which showed possible pathologic fractures of L1 and L4 vertebral bodies with greater than 50% height loss at L1 and abnormal signal involving L2-L3 S1 as well as right iliac bone concerning for metastatic disease.Patient is a single mother of 3 adult children and is very anxious today.She reports significant back pain which radiates to her bilateral thighs. Denies any focal tingling numbness or weakness. Denies any bowel bladder incontinence. Pain has been uncontrolled despite taking Tylenol. No prior history of abnormal breast biopsies. No family history of breast cancer  PET and MRIshowed 3 areas of pathologic fracture of her spine as  well as widespread bony metastatic disease and concern for impending fracture of the right hip. Given her worsening pain she was asked to come to the ER. She has been evaluated by Dr. Rudene Christians from orthopedic surgery and underwent kyphoplasty at 3 different levels.T6 L1 and L4 along with radiofrequency ablation.She also underwent prophylactic fixation of the right hip and not affected the sacral region.   Patient received first dose of Herceptin and Perjeta on 09/04/2019. Baseline echocardiogram normal.she is currently getting taxol/ herceptin/perjeta  Patient admitted to hospital for acute abdominal pain with CT findings concerning for acute colitis. Colonoscopy showed diffuse severe inflammation with erythema friability and loss of vascularity and shallow ulcerations in the IC valve, ascending colon and cecum.  Interval history-patient reports that her left flank pain as well as right hip pain is better.  She reports intermittent upper back pain in the midline as well as sternal pain.  Pain is overall well controlled with oxycodone and OxyContin.  Denies any tingling numbness in her hands and feet.  Does report fatigue after chemotherapy  ECOG PS- 1 Pain scale- 3 Opioid associated constipation- no  Review of systems- Review of Systems  Constitutional: Positive for malaise/fatigue. Negative for chills, fever and weight loss.  HENT: Negative for congestion, ear discharge and nosebleeds.   Eyes: Negative for blurred vision.  Respiratory: Negative for cough, hemoptysis, sputum production, shortness of breath and wheezing.   Cardiovascular: Negative for chest pain, palpitations, orthopnea and claudication.  Gastrointestinal: Negative for abdominal pain, blood in stool, constipation, diarrhea, heartburn, melena, nausea and vomiting.  Genitourinary: Negative for dysuria, flank pain, frequency, hematuria and urgency.  Musculoskeletal: Positive for back pain. Negative for  joint pain and  myalgias.  Skin: Negative for rash.  Neurological: Negative for dizziness, tingling, focal weakness, seizures, weakness and headaches.  Endo/Heme/Allergies: Does not bruise/bleed easily.  Psychiatric/Behavioral: Negative for depression and suicidal ideas. The patient does not have insomnia.        No Known Allergies   Past Medical History:  Diagnosis Date  . Anxiety   . Breast cancer ([place])    with mets  . Cancer ([place])   . Colitis   . Family history of colon cancer   . Vertigo      Past Surgical History:  Procedure Laterality Date  . BREAST BIOPSY Left 08/14/2019   [place] bx of mass, coil marker, path pending  . BREAST BIOPSY Left 08/14/2019   [place] bx of LN, hydromarker, path pending  . BREAST BIOPSY Left 08/14/2019   affirm bx of calcs, x marker, path pending  . ESOPHAGOGASTRODUODENOSCOPY (EGD) WITH PROPOFOL N/A 10/05/2019   Procedure: ESOPHAGOGASTRODUODENOSCOPY (EGD) WITH PROPOFOL;  Surgeon: Lin Landsman, MD;  Location: [place];  Service: Gastroenterology;  Laterality: N/A;  . FLEXIBLE SIGMOIDOSCOPY N/A 10/05/2019   Procedure: FLEXIBLE SIGMOIDOSCOPY;  Surgeon: Lin Landsman, MD;  Location[facility] ENDOSCOPY;  Service: Gastroenterology;  Laterality: N/A;  . INTRAMEDULLARY (IM) NAIL INTERTROCHANTERIC Right 09/01/2019   Procedure: INTRAMEDULLARY (IM) NAIL INTERTROCHANTRIC AND RADIOFREQUENCY ABLATION;  Surgeon: Hessie Knows, MD;  Location: [facility] ORS;  Service: Orthopedics;  Laterality: Right;  . KYPHOPLASTY N/A 08/29/2019   Procedure: KYPHOPLASTY T6, L1,L4 ,  RADIOFREQUENCY ABLATION;  Surgeon: Hessie Knows, MD;  Location: [facility] ORS;  Service: Orthopedics;  Laterality: N/A;  . KYPHOPLASTY Right 09/01/2019   Procedure: Right Sacral Radiofrequency Ablation and Cement Augmentation, Right sacrum and iliac crest;  Surgeon: Hessie Knows, MD;  Location: [facility] ORS;  Service: Orthopedics;  Laterality: Right;  . PORTA CATH INSERTION N/A 08/28/2019   Procedure: PORTA CATH INSERTION;   Surgeon: Algernon Huxley, MD;  Location: [place] [place] [facility];  Service: Cardiovascular;  Laterality: N/A;    Social History   Socioeconomic History  . Marital status: Single    Spouse name: Not on file  . Number of children: Not on file  . Years of education: Not on file  . Highest education level: Not on file  Occupational History  . Not on file  Tobacco Use  . Smoking status: Never Smoker  . Smokeless tobacco: Never Used  Vaping Use  . Vaping Use: Never used  Substance and Sexual Activity  . Alcohol use: Not Currently  . Drug use: Not Currently  . Sexual activity: Not Currently    Birth control/protection: None  Other Topics Concern  . Not on file  Social History Narrative   Lives at home with children   Social Determinants of Health   Financial Resource Strain: Not on file  Food Insecurity: Not on file  Transportation Needs: Not on file  Physical Activity: Not on file  Stress: Not on file  Social Connections: Not on file  Intimate Partner Violence: Not on file    Family History  Problem Relation Age of Onset  . Colon cancer Maternal Uncle      Current Outpatient Medications:  .  diphenoxylate-atropine (LOMOTIL) 2.5-0.025 MG tablet, Take 1 tablet by mouth 4 (four) times daily as needed for diarrhea or loose stools., Disp: 30 tablet, Rfl: 0 .  FLUoxetine (PROZAC) 10 MG capsule, Take 1 capsule (10 mg total) by mouth daily., Disp: 30 capsule, Rfl: 3 .  gabapentin (NEURONTIN) 300 MG capsule,  Take 1 capsule (300 mg total) by mouth 3 (three) times daily., Disp: 180 capsule, Rfl: 0 .  lidocaine-prilocaine (EMLA) cream, Apply 1 application topically as needed. Apply small amount to port site at least 1 hour prior to it being accessed, cover with plastic wrap, Disp: 30 g, Rfl: 1 .  oxycodone (OXY-IR) 5 MG capsule, Take 1-2 capsules (5-10 mg total) by mouth every 4 (four) hours as needed., Disp: 60 capsule, Rfl: 0 .  oxyCODONE (OXYCONTIN) 10 mg 12 hr tablet, Take 1 tablet (10  mg total) by mouth every 12 (twelve) hours., Disp: 60 tablet, Rfl: 0 .  potassium chloride SA (KLOR-CON) 20 MEQ tablet, Take 1 tablet (20 mEq total) by mouth daily., Disp: 30 tablet, Rfl: 0 .  traZODone (DESYREL) 100 MG tablet, Take 1 tablet (100 mg total) by mouth at bedtime. (Patient not taking: Reported on 04/23/2020), Disp: 30 tablet, Rfl: 2  Physical exam:  Vitals:   05/07/20 1001  BP: 128/79  Pulse: 85  Temp: (!) 96.8 F (36 C)  TempSrc: Tympanic  SpO2: 100%  Weight: 134 lb (60.8 kg)   Physical Exam Eyes:     Extraocular Movements: EOM normal.  Pulmonary:     Effort: Pulmonary effort is normal.  Skin:    General: Skin is warm and dry.  Neurological:     Mental Status: She is alert and oriented to person, place, and time.      CMP Latest Ref Rng & Units 04/30/2020  Glucose 70 - 99 mg/dL 107(H)  BUN 6 - 20 mg/dL 11  Creatinine 0.44 - 1.00 mg/dL 0.42(L)  Sodium 135 - 145 mmol/L 139  Potassium 3.5 - 5.1 mmol/L 3.6  Chloride 98 - 111 mmol/L 107  CO2 22 - 32 mmol/L 24  Calcium 8.9 - 10.3 mg/dL 8.7(L)  Total Protein 6.5 - 8.1 g/dL 6.7  Total Bilirubin 0.3 - 1.2 mg/dL 0.4  Alkaline Phos 38 - 126 U/L 56  AST 15 - 41 U/L 25  ALT 0 - 44 U/L 25   CBC Latest Ref Rng & Units 05/07/2020  WBC 4.0 - 10.5 K/uL 6.0  Hemoglobin 12.0 - 15.0 g/dL 11.3(L)  Hematocrit 36.0 - 46.0 % 34.8(L)  Platelets 150 - 400 K/uL 370     Assessment and plan- Patient is a 40 y.o. female with metastatic ER/PR negative HER-2 positive breast cancer with bone metastases here for on treatment assessment prior to cycle 12-day 15 of Taxol  Counts okay to proceed with cycle 12-day 15 of Taxol today.  She will directly proceed for cycle 13-day 1 of Taxol Herceptin and Perjeta in 1 week and Taxol alone in 2 weeks and I will see her back in 3 weeks for cycle 13-day 15 of Taxol alone.  If patient continues to have significant fatigue with chemotherapy I will consider stopping Taxol and continuing with Herceptin  and Perjeta alone.  Neoplasm related pain: Continue OxyContin and oxycodone.  I have renewed her OxyContin prescription today.  She is also on gabapentin  Depression: On Prozac  On trazodone for insomnia   Visit Diagnosis 1. Encounter for antineoplastic chemotherapy   2. Bone metastases ([place])   3. Primary cancer of left breast with metastasis to other site ([place])   4. Neoplasm related pain   5. Anxiety      Dr. Randa Evens, MD, MPH [facility] [phone] 05/07/2020 10:07 AM

## 2020-05-07 NOTE — Progress Notes (Signed)
Pt fells tired on and off for this week, her appetite is not as good, she goes from moments to next moment of how she feels. She points to her back really right ib between shoulder blades and it hurts more at night and some time in am but not right now- she does not rate it as pain , just sore right now no number she said.

## 2020-05-07 NOTE — Progress Notes (Signed)
Pt received prescribed treatment in clinic, pt stable at d/c. 

## 2020-05-14 ENCOUNTER — Other Ambulatory Visit: Payer: Self-pay

## 2020-05-14 ENCOUNTER — Ambulatory Visit: Payer: Self-pay

## 2020-05-14 ENCOUNTER — Inpatient Hospital Stay (HOSPITAL_BASED_OUTPATIENT_CLINIC_OR_DEPARTMENT_OTHER): Payer: Self-pay | Admitting: Hospice and Palliative Medicine

## 2020-05-14 ENCOUNTER — Inpatient Hospital Stay: Payer: Self-pay

## 2020-05-14 VITALS — BP 109/64 | HR 82 | Temp 97.4°F | Wt 137.0 lb

## 2020-05-14 DIAGNOSIS — C50919 Malignant neoplasm of unspecified site of unspecified female breast: Secondary | ICD-10-CM

## 2020-05-14 DIAGNOSIS — M8440XA Pathological fracture, unspecified site, initial encounter for fracture: Secondary | ICD-10-CM

## 2020-05-14 DIAGNOSIS — Z5111 Encounter for antineoplastic chemotherapy: Secondary | ICD-10-CM

## 2020-05-14 LAB — COMPREHENSIVE METABOLIC PANEL
ALT: 38 U/L (ref 0–44)
AST: 47 U/L — ABNORMAL HIGH (ref 15–41)
Albumin: 3.5 g/dL (ref 3.5–5.0)
Alkaline Phosphatase: 66 U/L (ref 38–126)
Anion gap: 10 (ref 5–15)
BUN: 13 mg/dL (ref 6–20)
CO2: 22 mmol/L (ref 22–32)
Calcium: 8.6 mg/dL — ABNORMAL LOW (ref 8.9–10.3)
Chloride: 104 mmol/L (ref 98–111)
Creatinine, Ser: 0.59 mg/dL (ref 0.44–1.00)
GFR, Estimated: >60 mL/min (ref >60)
Glucose, Bld: 124 mg/dL — ABNORMAL HIGH (ref 70–99)
Potassium: 3.3 mmol/L — ABNORMAL LOW (ref 3.5–5.1)
Sodium: 136 mmol/L (ref 135–145)
Total Bilirubin: 0.3 mg/dL (ref 0.3–1.2)
Total Protein: 6.6 g/dL (ref 6.5–8.1)

## 2020-05-14 LAB — CBC WITH DIFFERENTIAL/PLATELET
Abs Immature Granulocytes: 0.08 10*3/uL — ABNORMAL HIGH (ref 0.00–0.07)
Basophils Absolute: 0 10*3/uL (ref 0.0–0.1)
Basophils Relative: 1 %
Eosinophils Absolute: 0.1 10*3/uL (ref 0.0–0.5)
Eosinophils Relative: 2 %
HCT: 34.1 % — ABNORMAL LOW (ref 36.0–46.0)
Hemoglobin: 11.1 g/dL — ABNORMAL LOW (ref 12.0–15.0)
Immature Granulocytes: 1 %
Lymphocytes Relative: 19 %
Lymphs Abs: 1.1 10*3/uL (ref 0.7–4.0)
MCH: 27.9 pg (ref 26.0–34.0)
MCHC: 32.6 g/dL (ref 30.0–36.0)
MCV: 85.7 fL (ref 80.0–100.0)
Monocytes Absolute: 0.4 10*3/uL (ref 0.1–1.0)
Monocytes Relative: 7 %
Neutro Abs: 4 10*3/uL (ref 1.7–7.7)
Neutrophils Relative %: 70 %
Platelets: 402 10*3/uL — ABNORMAL HIGH (ref 150–400)
RBC: 3.98 MIL/uL (ref 3.87–5.11)
RDW: 17 % — ABNORMAL HIGH (ref 11.5–15.5)
WBC: 5.7 10*3/uL (ref 4.0–10.5)
nRBC: 0 % (ref 0.0–0.2)

## 2020-05-14 LAB — URINALYSIS, COMPLETE (UACMP) WITH MICROSCOPIC
Bilirubin Urine: NEGATIVE
Glucose, UA: NEGATIVE mg/dL
Hgb urine dipstick: NEGATIVE
Ketones, ur: NEGATIVE mg/dL
Nitrite: NEGATIVE
Protein, ur: NEGATIVE mg/dL
Specific Gravity, Urine: 1.009 (ref 1.005–1.030)
Squamous Epithelial / HPF: NONE SEEN (ref 0–5)
pH: 5 (ref 5.0–8.0)

## 2020-05-14 MED ORDER — HEPARIN SOD (PORK) LOCK FLUSH 100 UNIT/ML IV SOLN
500.0000 [IU] | Freq: Once | INTRAVENOUS | Status: AC
  Administered 2020-05-14: 500 [IU] via INTRAVENOUS
  Filled 2020-05-14: qty 5

## 2020-05-14 MED ORDER — SODIUM CHLORIDE 0.9 % IV SOLN
65.0000 mg/m2 | Freq: Once | INTRAVENOUS | Status: AC
  Administered 2020-05-14: 108 mg via INTRAVENOUS
  Filled 2020-05-14: qty 18

## 2020-05-14 MED ORDER — ACETAMINOPHEN 325 MG PO TABS
650.0000 mg | ORAL_TABLET | Freq: Once | ORAL | Status: AC
  Administered 2020-05-14: 650 mg via ORAL
  Filled 2020-05-14: qty 2

## 2020-05-14 MED ORDER — TRASTUZUMAB-ANNS CHEMO 150 MG IV SOLR
6.0000 mg/kg | Freq: Once | INTRAVENOUS | Status: AC
  Administered 2020-05-14: 357 mg via INTRAVENOUS
  Filled 2020-05-14: qty 17

## 2020-05-14 MED ORDER — DIPHENHYDRAMINE HCL 50 MG/ML IJ SOLN
50.0000 mg | Freq: Once | INTRAMUSCULAR | Status: AC
  Administered 2020-05-14: 50 mg via INTRAVENOUS
  Filled 2020-05-14: qty 1

## 2020-05-14 MED ORDER — SODIUM CHLORIDE 0.9 % IV SOLN
20.0000 mg | Freq: Once | INTRAVENOUS | Status: AC
  Administered 2020-05-14: 20 mg via INTRAVENOUS
  Filled 2020-05-14: qty 20

## 2020-05-14 MED ORDER — SODIUM CHLORIDE 0.9 % IV SOLN
Freq: Once | INTRAVENOUS | Status: AC
  Filled 2020-05-14: qty 250

## 2020-05-14 MED ORDER — FAMOTIDINE IN NACL 20-0.9 MG/50ML-% IV SOLN
20.0000 mg | Freq: Once | INTRAVENOUS | Status: AC
  Administered 2020-05-14: 20 mg via INTRAVENOUS
  Filled 2020-05-14: qty 50

## 2020-05-14 MED ORDER — SODIUM CHLORIDE 0.9 % IV SOLN
420.0000 mg | Freq: Once | INTRAVENOUS | Status: AC
  Administered 2020-05-14: 420 mg via INTRAVENOUS
  Filled 2020-05-14: qty 14

## 2020-05-14 MED ORDER — SODIUM CHLORIDE 0.9% FLUSH
10.0000 mL | Freq: Once | INTRAVENOUS | Status: DC
  Filled 2020-05-14: qty 10

## 2020-05-14 NOTE — Progress Notes (Signed)
Attempted virtual visit via interpreter but no answer. Will see reschedule for when patient returns to clinic.

## 2020-05-16 ENCOUNTER — Other Ambulatory Visit: Payer: Self-pay | Admitting: *Deleted

## 2020-05-16 LAB — URINE CULTURE: Culture: >=100000 — AB

## 2020-05-16 MED ORDER — AMOXICILLIN-POT CLAVULANATE 875-125 MG PO TABS: 1.0000 | ORAL_TABLET | Freq: Two times a day (BID) | ORAL | 0 refills | Status: DC

## 2020-05-17 ENCOUNTER — Other Ambulatory Visit: Payer: Self-pay

## 2020-05-17 ENCOUNTER — Telehealth: Payer: Self-pay

## 2020-05-17 MED ORDER — DIPHENOXYLATE-ATROPINE 2.5-0.025 MG PO TABS: 1.0000 | ORAL_TABLET | Freq: Four times a day (QID) | ORAL | 0 refills | Status: DC | PRN

## 2020-05-17 NOTE — Telephone Encounter (Signed)
Patient informed to take Imodium and to call back tomorrow if it is not working. She also mentioned being very tired. Advised her to drink fluids and Gatorade as she may be dehydrated due to diarrhea.

## 2020-05-17 NOTE — Telephone Encounter (Signed)
Patient notified of Augmentin being sent to pharmacy for UTI.  Adminstration instructions given and pt voiced understanding. Pt also states that she called to request Lomitil earlier today, but that it has not really been working. She would like to know if there is anyting else she can try for diarrhea. Diarrhea started Friday night and she has to use the bathroom every time she eats. Please advise.

## 2020-05-17 NOTE — Telephone Encounter (Signed)
Sophia [name] is going to call her back and tell her about imodium in [place]. I sent her a secure chat asking for elizabeth to call.

## 2020-05-17 NOTE — Telephone Encounter (Signed)
Try prn imodium 2 mg q2 prn with each diarrhea. Max 16 mg. Call back tomorrow if not helping

## 2020-05-17 NOTE — Telephone Encounter (Signed)
Refill sent in for the patient 

## 2020-05-19 ENCOUNTER — Telehealth: Payer: Self-pay | Admitting: *Deleted

## 2020-05-19 NOTE — Telephone Encounter (Signed)
Pt had left message in spanish and I got Sophia [name] to tell me what it said. Pt having burning from uto and she got an atb and then got a cream to help with pain and it did not help. She wanted to know if there is anything else to give her. I spoke to Sophia [name] and she pt could use AZO over the counter. Take 2 tablets three times a day for 2 days and then stop. It will make urine turn orange and it will stain any clothing that urine comes into contact with. Sophia [name] told her all of this and pt said she will try it, she states diarrhea is gone. She feels tired , a little more than usual.

## 2020-05-21 ENCOUNTER — Inpatient Hospital Stay: Payer: Self-pay | Attending: Oncology

## 2020-05-21 ENCOUNTER — Other Ambulatory Visit: Payer: Self-pay

## 2020-05-21 ENCOUNTER — Other Ambulatory Visit: Payer: Self-pay | Admitting: *Deleted

## 2020-05-21 ENCOUNTER — Telehealth: Payer: Self-pay | Admitting: *Deleted

## 2020-05-21 ENCOUNTER — Other Ambulatory Visit: Payer: Self-pay | Admitting: Nurse Practitioner

## 2020-05-21 ENCOUNTER — Inpatient Hospital Stay: Payer: Self-pay

## 2020-05-21 VITALS — BP 113/77 | HR 78 | Temp 98.7°F | Resp 20 | Wt 137.0 lb

## 2020-05-21 DIAGNOSIS — F419 Anxiety disorder, unspecified: Secondary | ICD-10-CM | POA: Insufficient documentation

## 2020-05-21 DIAGNOSIS — Z171 Estrogen receptor negative status [ER-]: Secondary | ICD-10-CM | POA: Insufficient documentation

## 2020-05-21 DIAGNOSIS — G620 Drug-induced polyneuropathy: Secondary | ICD-10-CM | POA: Insufficient documentation

## 2020-05-21 DIAGNOSIS — C7951 Secondary malignant neoplasm of bone: Secondary | ICD-10-CM

## 2020-05-21 DIAGNOSIS — R197 Diarrhea, unspecified: Secondary | ICD-10-CM | POA: Insufficient documentation

## 2020-05-21 DIAGNOSIS — Z5111 Encounter for antineoplastic chemotherapy: Secondary | ICD-10-CM | POA: Insufficient documentation

## 2020-05-21 DIAGNOSIS — R5383 Other fatigue: Secondary | ICD-10-CM | POA: Insufficient documentation

## 2020-05-21 DIAGNOSIS — C50919 Malignant neoplasm of unspecified site of unspecified female breast: Secondary | ICD-10-CM

## 2020-05-21 DIAGNOSIS — Z5112 Encounter for antineoplastic immunotherapy: Secondary | ICD-10-CM | POA: Insufficient documentation

## 2020-05-21 DIAGNOSIS — M8440XA Pathological fracture, unspecified site, initial encounter for fracture: Secondary | ICD-10-CM

## 2020-05-21 DIAGNOSIS — C50912 Malignant neoplasm of unspecified site of left female breast: Secondary | ICD-10-CM | POA: Insufficient documentation

## 2020-05-21 DIAGNOSIS — T451X5A Adverse effect of antineoplastic and immunosuppressive drugs, initial encounter: Secondary | ICD-10-CM | POA: Insufficient documentation

## 2020-05-21 DIAGNOSIS — Z79899 Other long term (current) drug therapy: Secondary | ICD-10-CM | POA: Insufficient documentation

## 2020-05-21 DIAGNOSIS — E559 Vitamin D deficiency, unspecified: Secondary | ICD-10-CM

## 2020-05-21 DIAGNOSIS — M549 Dorsalgia, unspecified: Secondary | ICD-10-CM | POA: Insufficient documentation

## 2020-05-21 DIAGNOSIS — R5381 Other malaise: Secondary | ICD-10-CM | POA: Insufficient documentation

## 2020-05-21 DIAGNOSIS — Z515 Encounter for palliative care: Secondary | ICD-10-CM | POA: Insufficient documentation

## 2020-05-21 DIAGNOSIS — E876 Hypokalemia: Secondary | ICD-10-CM | POA: Insufficient documentation

## 2020-05-21 DIAGNOSIS — G893 Neoplasm related pain (acute) (chronic): Secondary | ICD-10-CM | POA: Insufficient documentation

## 2020-05-21 LAB — CBC WITH DIFFERENTIAL/PLATELET
Abs Immature Granulocytes: 0.08 10*3/uL — ABNORMAL HIGH (ref 0.00–0.07)
Basophils Absolute: 0.1 10*3/uL (ref 0.0–0.1)
Basophils Relative: 1 %
Eosinophils Absolute: 0.1 10*3/uL (ref 0.0–0.5)
Eosinophils Relative: 2 %
HCT: 34.9 % — ABNORMAL LOW (ref 36.0–46.0)
Hemoglobin: 11.4 g/dL — ABNORMAL LOW (ref 12.0–15.0)
Immature Granulocytes: 1 %
Lymphocytes Relative: 20 %
Lymphs Abs: 1.2 10*3/uL (ref 0.7–4.0)
MCH: 27.7 pg (ref 26.0–34.0)
MCHC: 32.7 g/dL (ref 30.0–36.0)
MCV: 84.7 fL (ref 80.0–100.0)
Monocytes Absolute: 0.4 10*3/uL (ref 0.1–1.0)
Monocytes Relative: 6 %
Neutro Abs: 4.2 10*3/uL (ref 1.7–7.7)
Neutrophils Relative %: 70 %
Platelets: 408 10*3/uL — ABNORMAL HIGH (ref 150–400)
RBC: 4.12 MIL/uL (ref 3.87–5.11)
RDW: 17.2 % — ABNORMAL HIGH (ref 11.5–15.5)
WBC: 6.1 10*3/uL (ref 4.0–10.5)
nRBC: 0 % (ref 0.0–0.2)

## 2020-05-21 LAB — COMPREHENSIVE METABOLIC PANEL
ALT: 33 U/L (ref 0–44)
AST: 45 U/L — ABNORMAL HIGH (ref 15–41)
Albumin: 3.5 g/dL (ref 3.5–5.0)
Alkaline Phosphatase: 69 U/L (ref 38–126)
Anion gap: 11 (ref 5–15)
BUN: 11 mg/dL (ref 6–20)
CO2: 22 mmol/L (ref 22–32)
Calcium: 8.3 mg/dL — ABNORMAL LOW (ref 8.9–10.3)
Chloride: 104 mmol/L (ref 98–111)
Creatinine, Ser: 0.44 mg/dL (ref 0.44–1.00)
GFR, Estimated: >60 mL/min (ref >60)
Glucose, Bld: 109 mg/dL — ABNORMAL HIGH (ref 70–99)
Potassium: 3.4 mmol/L — ABNORMAL LOW (ref 3.5–5.1)
Sodium: 137 mmol/L (ref 135–145)
Total Bilirubin: 0.5 mg/dL (ref 0.3–1.2)
Total Protein: 6.4 g/dL — ABNORMAL LOW (ref 6.5–8.1)

## 2020-05-21 MED ORDER — HEPARIN SOD (PORK) LOCK FLUSH 100 UNIT/ML IV SOLN
INTRAVENOUS | Status: AC
  Filled 2020-05-21: qty 5

## 2020-05-21 MED ORDER — DENOSUMAB 120 MG/1.7ML ~~LOC~~ SOLN
120.0000 mg | Freq: Once | SUBCUTANEOUS | Status: AC
  Administered 2020-05-21: 120 mg via SUBCUTANEOUS
  Filled 2020-05-21: qty 1.7

## 2020-05-21 MED ORDER — SODIUM CHLORIDE 0.9 % IV SOLN
Freq: Once | INTRAVENOUS | Status: AC
  Filled 2020-05-21: qty 250

## 2020-05-21 MED ORDER — DIPHENOXYLATE-ATROPINE 2.5-0.025 MG PO TABS: 1.0000 | ORAL_TABLET | Freq: Four times a day (QID) | ORAL | 0 refills | Status: DC | PRN

## 2020-05-21 MED ORDER — SODIUM CHLORIDE 0.9% FLUSH
10.0000 mL | INTRAVENOUS | Status: DC | PRN
  Administered 2020-05-21: 10 mL via INTRAVENOUS
  Filled 2020-05-21: qty 10

## 2020-05-21 MED ORDER — SODIUM CHLORIDE 0.9 % IV SOLN
20.0000 mg | Freq: Once | INTRAVENOUS | Status: AC
  Administered 2020-05-21: 20 mg via INTRAVENOUS
  Filled 2020-05-21: qty 20

## 2020-05-21 MED ORDER — POTASSIUM CHLORIDE CRYS ER 20 MEQ PO TBCR: 20.0000 meq | EXTENDED_RELEASE_TABLET | Freq: Every day | ORAL | 0 refills | Status: DC

## 2020-05-21 MED ORDER — HEPARIN SOD (PORK) LOCK FLUSH 100 UNIT/ML IV SOLN
500.0000 [IU] | Freq: Once | INTRAVENOUS | Status: AC
  Administered 2020-05-21: 500 [IU] via INTRAVENOUS
  Filled 2020-05-21: qty 5

## 2020-05-21 MED ORDER — DIPHENHYDRAMINE HCL 50 MG/ML IJ SOLN
50.0000 mg | Freq: Once | INTRAMUSCULAR | Status: AC
  Administered 2020-05-21: 50 mg via INTRAVENOUS
  Filled 2020-05-21: qty 1

## 2020-05-21 MED ORDER — OXYCODONE HCL 5 MG PO TABS
5.0000 mg | ORAL_TABLET | Freq: Once | ORAL | Status: AC
  Administered 2020-05-21: 5 mg via ORAL
  Filled 2020-05-21: qty 1

## 2020-05-21 MED ORDER — FAMOTIDINE IN NACL 20-0.9 MG/50ML-% IV SOLN
20.0000 mg | Freq: Once | INTRAVENOUS | Status: AC
  Administered 2020-05-21: 20 mg via INTRAVENOUS
  Filled 2020-05-21: qty 50

## 2020-05-21 MED ORDER — PACLITAXEL CHEMO INJECTION 300 MG/50ML
65.0000 mg/m2 | Freq: Once | INTRAVENOUS | Status: AC
  Administered 2020-05-21: 108 mg via INTRAVENOUS
  Filled 2020-05-21: qty 18

## 2020-05-21 NOTE — Progress Notes (Signed)
1032- Potassium: 3.4, Calcium: 8.3. NP, Beckey Rutter, notified. Per NP order: proceed with Xgeva injection today; NP wants patient to start on Calcium and Vitamin D supplements daily at home. Novella Olive, RN, came to chair side and educated patient verbally and provided written information on dosage amount and options that patient can get over the counter for both Calcium and Vitamin D supplements. Patient verbalized understanding. Also, per NP, Beckey Rutter, order: instruct patient to continue taking Potassium as prescribed at home- new prescription is being sent to patient's pharmacy. Patient informed by both Novella Olive, RN and myself, Nemiah Commander, RN. Patient verbalized understanding.

## 2020-05-21 NOTE — Telephone Encounter (Signed)
Pt wanted refill of lomotil and it was sent but it printer rather than went electronically and I sent it today and it did go through. The pt potassium was 3.4 today - I have sent refill of potassium for pt. Also pt calcium was low. Lauren suggested 1200 mg daily with vitamin d 800 IU a day, options 2  Is 4 extra strength tums daily. Or option 3 is 6 regular strength tums. She asked which one was best and I told her any of them will get her calcium in. Probably cheapest is extra strength and the less amount of pills. She also spoke to me about HA and some pain in between scapular. Oxycodone 5mg  will be given to her in infusion area today.i told her that she should bring her oxycodone with her so if she needed it she could let us know so we know that she is taking a pain pill

## 2020-05-21 NOTE — Progress Notes (Signed)
Ca 8.3, proceed with xgeva per NP.  Ordering ca/VitD supplement for patient to start

## 2020-05-28 ENCOUNTER — Inpatient Hospital Stay: Payer: Self-pay

## 2020-05-28 ENCOUNTER — Inpatient Hospital Stay (HOSPITAL_BASED_OUTPATIENT_CLINIC_OR_DEPARTMENT_OTHER): Payer: Self-pay | Admitting: Oncology

## 2020-05-28 ENCOUNTER — Inpatient Hospital Stay: Payer: Self-pay | Admitting: Hospice and Palliative Medicine

## 2020-05-28 ENCOUNTER — Other Ambulatory Visit: Payer: Self-pay

## 2020-05-28 ENCOUNTER — Encounter: Payer: Self-pay | Admitting: Oncology

## 2020-05-28 VITALS — BP 110/75 | HR 101 | Temp 100.1°F | Resp 16 | Ht 60.0 in | Wt 136.8 lb

## 2020-05-28 VITALS — Temp 97.6°F

## 2020-05-28 DIAGNOSIS — C50919 Malignant neoplasm of unspecified site of unspecified female breast: Secondary | ICD-10-CM

## 2020-05-28 DIAGNOSIS — M8440XA Pathological fracture, unspecified site, initial encounter for fracture: Secondary | ICD-10-CM

## 2020-05-28 DIAGNOSIS — Z5111 Encounter for antineoplastic chemotherapy: Secondary | ICD-10-CM

## 2020-05-28 DIAGNOSIS — G893 Neoplasm related pain (acute) (chronic): Secondary | ICD-10-CM

## 2020-05-28 DIAGNOSIS — C50912 Malignant neoplasm of unspecified site of left female breast: Secondary | ICD-10-CM

## 2020-05-28 LAB — CBC WITH DIFFERENTIAL/PLATELET
Abs Immature Granulocytes: 0.06 10*3/uL (ref 0.00–0.07)
Basophils Absolute: 0 10*3/uL (ref 0.0–0.1)
Basophils Relative: 1 %
Eosinophils Absolute: 0.1 10*3/uL (ref 0.0–0.5)
Eosinophils Relative: 1 %
HCT: 35 % — ABNORMAL LOW (ref 36.0–46.0)
Hemoglobin: 11.5 g/dL — ABNORMAL LOW (ref 12.0–15.0)
Immature Granulocytes: 1 %
Lymphocytes Relative: 20 %
Lymphs Abs: 1.2 10*3/uL (ref 0.7–4.0)
MCH: 27.7 pg (ref 26.0–34.0)
MCHC: 32.9 g/dL (ref 30.0–36.0)
MCV: 84.3 fL (ref 80.0–100.0)
Monocytes Absolute: 0.4 10*3/uL (ref 0.1–1.0)
Monocytes Relative: 6 %
Neutro Abs: 4.3 10*3/uL (ref 1.7–7.7)
Neutrophils Relative %: 71 %
Platelets: 408 10*3/uL — ABNORMAL HIGH (ref 150–400)
RBC: 4.15 MIL/uL (ref 3.87–5.11)
RDW: 17.4 % — ABNORMAL HIGH (ref 11.5–15.5)
WBC: 6.1 10*3/uL (ref 4.0–10.5)
nRBC: 0 % (ref 0.0–0.2)

## 2020-05-28 LAB — COMPREHENSIVE METABOLIC PANEL
ALT: 24 U/L (ref 0–44)
AST: 44 U/L — ABNORMAL HIGH (ref 15–41)
Albumin: 3.5 g/dL (ref 3.5–5.0)
Alkaline Phosphatase: 69 U/L (ref 38–126)
Anion gap: 9 (ref 5–15)
BUN: 10 mg/dL (ref 6–20)
CO2: 23 mmol/L (ref 22–32)
Calcium: 8.6 mg/dL — ABNORMAL LOW (ref 8.9–10.3)
Chloride: 104 mmol/L (ref 98–111)
Creatinine, Ser: 0.66 mg/dL (ref 0.44–1.00)
GFR, Estimated: >60 mL/min (ref >60)
Glucose, Bld: 136 mg/dL — ABNORMAL HIGH (ref 70–99)
Potassium: 3.5 mmol/L (ref 3.5–5.1)
Sodium: 136 mmol/L (ref 135–145)
Total Bilirubin: 0.4 mg/dL (ref 0.3–1.2)
Total Protein: 6.6 g/dL (ref 6.5–8.1)

## 2020-05-28 MED ORDER — SODIUM CHLORIDE 0.9% FLUSH
10.0000 mL | Freq: Once | INTRAVENOUS | Status: AC
  Administered 2020-05-28: 10 mL via INTRAVENOUS
  Filled 2020-05-28: qty 10

## 2020-05-28 MED ORDER — HEPARIN SOD (PORK) LOCK FLUSH 100 UNIT/ML IV SOLN
500.0000 [IU] | Freq: Once | INTRAVENOUS | Status: AC | PRN
  Administered 2020-05-28: 500 [IU]
  Filled 2020-05-28: qty 5

## 2020-05-28 MED ORDER — DIPHENHYDRAMINE HCL 50 MG/ML IJ SOLN
50.0000 mg | Freq: Once | INTRAMUSCULAR | Status: AC
  Administered 2020-05-28: 50 mg via INTRAVENOUS
  Filled 2020-05-28: qty 1

## 2020-05-28 MED ORDER — SODIUM CHLORIDE 0.9 % IV SOLN
20.0000 mg | Freq: Once | INTRAVENOUS | Status: AC
  Administered 2020-05-28: 20 mg via INTRAVENOUS
  Filled 2020-05-28: qty 20

## 2020-05-28 MED ORDER — FAMOTIDINE IN NACL 20-0.9 MG/50ML-% IV SOLN
20.0000 mg | Freq: Once | INTRAVENOUS | Status: AC
  Administered 2020-05-28: 20 mg via INTRAVENOUS
  Filled 2020-05-28: qty 50

## 2020-05-28 MED ORDER — SODIUM CHLORIDE 0.9 % IV SOLN
65.0000 mg/m2 | Freq: Once | INTRAVENOUS | Status: AC
  Administered 2020-05-28: 108 mg via INTRAVENOUS
  Filled 2020-05-28: qty 18

## 2020-05-28 MED ORDER — SODIUM CHLORIDE 0.9 % IV SOLN
Freq: Once | INTRAVENOUS | Status: AC
  Filled 2020-05-28: qty 250

## 2020-05-28 MED ORDER — HEPARIN SOD (PORK) LOCK FLUSH 100 UNIT/ML IV SOLN
INTRAVENOUS | Status: AC
  Filled 2020-05-28: qty 5

## 2020-05-28 NOTE — Progress Notes (Signed)
Pt worries about her head hurting. She points to back of her head, it is not all the time but she worries when it comes. She states that her 3 fingers on left hand starting with pinky finger like it fall asleep but she is not sleeping. She says she had goo and bad days and when it is bad she eats and gets sick and vomits and feels better. Has low grade fever today, no other symptoms

## 2020-05-28 NOTE — Progress Notes (Signed)
Pt received IV taxol in clinic today. Tolerated well. No complaints at d/c.

## 2020-05-28 NOTE — Progress Notes (Signed)
HR 101, ok to proceed per MD 

## 2020-05-29 LAB — CANCER ANTIGEN 27.29: CA 27.29: 13.8 U/mL (ref 0.0–38.6)

## 2020-05-29 NOTE — Progress Notes (Signed)
Hematology/Oncology Consult note [facility]  Telephone:([phone] [phone]  Patient Care Team: [place], [place], NP as PCP - General Rico Junker, RN as Registered Nurse Theodore Demark, RN as Registered Nurse Sindy Guadeloupe, MD as Consulting Physician (Hematology and Oncology)   Name of the patient: Sophia [name]  [id number]  10/14/1980   Date of visit: 05/29/20  Diagnosis- stage IV metastatic breast cancer ER/PR negative HER-2/neu positive with bone metastases   Chief complaint/ Reason for visit-on treatment assessment prior to cycle 13-day 15  of Taxol  Heme/Onc history: patient is a 40 year old Hispanic female who is here with her friend. History obtained with the help of an interpreter.Patient self palpated left breast mass which was followed by a diagnostic bilateral mammogram. Mammogram showed 3.1 x 2.9 x 1.9 cm hypoechoic mass at the 1 o'clock position of the left breast. For abnormal cortically thickened left axillary lymph nodes measuring up to 5 mm. 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 Patient was also having ongoing back pain and was seen by [facility] orthopedics Dr. Doyle Askew who ordered MRI lumbar spine without contrast which showed possible pathologic fractures of L1 and L4 vertebral bodies with greater than 50% height loss at L1 and abnormal signal involving L2-L3 S1 as well as right iliac bone concerning for metastatic disease.Patient is a single mother of 3 adult children and is very anxious today.She reports significant back pain which radiates to her bilateral thighs. Denies any focal tingling numbness or weakness. Denies any bowel bladder incontinence. Pain has been uncontrolled despite taking Tylenol. No prior history of abnormal breast biopsies. No family history of breast  cancer  PET and MRIshowed 3 areas of pathologic fracture of her spine as well as widespread bony metastatic disease and concern for impending fracture of the right hip. Given her worsening pain she was asked to come to the ER. She has been evaluated by Dr. Rudene Christians from orthopedic surgery and underwent kyphoplasty at 3 different levels.T6 L1 and L4 along with radiofrequency ablation.She also underwent prophylactic fixation of the right hip and not affected the sacral region.   Patient received first dose of Herceptin and Perjeta on 09/04/2019. Baseline echocardiogram normal.she is currently getting taxol/ herceptin/perjeta  Patient admitted to hospital for acute abdominal pain with CT findings concerning for acute colitis. Colonoscopy showed diffuse severe inflammation with erythema friability and loss of vascularity and shallow ulcerations in the IC valve, ascending colon and cecum.  Interval history-history obtained with the help of a Spanish interpreter.  Patient reports occasional pain in the left part of her head.  Has baseline anxiety.  Has chronic back pain.  Reports tingling numbness in her hands and feet.  Has random episodes of self-limited nausea followed by vomiting about once a week.  ECOG PS- 1 Pain scale- 3 Opioid associated constipation- no  Review of systems- Review of Systems  Constitutional: Positive for malaise/fatigue. Negative for chills, fever and weight loss.  HENT: Negative for congestion, ear discharge and nosebleeds.   Eyes: Negative for blurred vision.  Respiratory: Negative for cough, hemoptysis, sputum production, shortness of breath and wheezing.   Cardiovascular: Negative for chest pain, palpitations, orthopnea and claudication.  Gastrointestinal: Negative for abdominal pain, blood in stool, constipation, diarrhea, heartburn, melena, nausea and vomiting.  Genitourinary: Negative for dysuria, flank pain, frequency, hematuria and urgency.  Musculoskeletal: Positive for back pain. Negative for joint pain and myalgias.  Skin: Negative for rash.  Neurological: Positive for sensory change (Peripheral neuropathy). Negative for dizziness, tingling, focal weakness, seizures, weakness and headaches.  Endo/Heme/Allergies: Does not bruise/bleed easily.  Psychiatric/Behavioral: Negative for depression and suicidal ideas. The patient is nervous/anxious. The patient does not have insomnia.        No Known Allergies   Past Medical History:  Diagnosis Date  . Anxiety   . Breast cancer ([place])    with mets  . Cancer ([place])   . Colitis   . Family history of colon cancer   . Vertigo      Past Surgical History:  Procedure Laterality Date  . BREAST BIOPSY Left 08/14/2019   [place] bx of mass, coil marker, path pending  . BREAST BIOPSY Left 08/14/2019   [place] bx of LN, hydromarker, path pending  . BREAST BIOPSY Left 08/14/2019   affirm bx of calcs, x marker, path pending  . ESOPHAGOGASTRODUODENOSCOPY (EGD) WITH PROPOFOL N/A 10/05/2019   Procedure: ESOPHAGOGASTRODUODENOSCOPY (EGD) WITH PROPOFOL;  Surgeon: Lin Landsman, MD;  Location: [place];  Service: Gastroenterology;  Laterality: N/A;  . FLEXIBLE SIGMOIDOSCOPY N/A 10/05/2019   Procedure: FLEXIBLE SIGMOIDOSCOPY;  Surgeon: Lin Landsman, MD;  Location: [facility]COPY;  Service: Gastroenterology;  Laterality: N/A;  . INTRAMEDULLARY (IM) NAIL INTERTROCHANTERIC Right 09/01/2019   Procedure: INTRAMEDULLARY (IM) NAIL INTERTROCHANTRIC AND RADIOFREQUENCY ABLATION;  Surgeon: Hessie Knows, MD;  Location: [facility]S;  Service: Orthopedics;  Laterality: Right;  . KYPHOPLASTY N/A 08/29/2019   Procedure: KYPHOPLASTY T6, L1,L4 ,  RADIOFREQUENCY ABLATION;  Surgeon: Hessie Knows, MD;  Location: [facility] OR[facility];  Service: Orthopedics;  Laterality: N/A;  . KYPHOPLASTY Right 09/01/2019   Procedure: Right Sacral Radiofrequency Ablation and Cement Augmentation, Right sacrum and iliac crest;  Surgeon:  Hessie Knows, MD;  Location: [facility];  Service: Orthopedics;  Laterality: Right;  . PORTA CATH INSERTION N/A 08/28/2019   Procedure: PORTA CATH INSERTION;  Surgeon: Algernon Huxley, MD;  Location: [place] [facility];  Service: Cardiovascular;  Laterality: N/A;    Social History   Socioeconomic History  . Marital status: Single    Spouse name: Not on file  . Number of children: Not on file  . Years of education: Not on file  . Highest education level: Not on file  Occupational History  . Not on file  Tobacco Use  . Smoking status: Never Smoker  . Smokeless tobacco: Never Used  Vaping Use  . Vaping Use: Never used  Substance and Sexual Activity  . Alcohol use: Not Currently  . Drug use: Not Currently  . Sexual activity: Not Currently    Birth control/protection: None  Other Topics Concern  . Not on file  Social History Narrative   Lives at home with children   Social Determinants of Health   Financial Resource Strain: Not on file  Food Insecurity: Not on file  Transportation Needs: Not on file  Physical Activity: Not on file  Stress: Not on file  Social Connections: Not on file  Intimate Partner Violence: Not on file    Family History  Problem Relation Age of Onset  . Colon cancer Maternal Uncle      Current Outpatient Medications:  .  clindamycin (CLINDAGEL) 1 % gel, Apply topically 2 (two) times daily. X 7 days., Disp: 30 g, Rfl: 0 .  diphenoxylate-atropine (LOMOTIL) 2.5-0.025 MG tablet, Take 1 tablet by mouth 4 (four) times daily as needed for  diarrhea or loose stools., Disp: 40 tablet, Rfl: 0 .  FLUoxetine (PROZAC) 10 MG capsule, Take 1 capsule (10 mg total) by mouth daily., Disp: 30 capsule, Rfl: 3 .  gabapentin (NEURONTIN) 300 MG capsule, Take 1 capsule (300 mg total) by mouth 3 (three) times daily., Disp: 180 capsule, Rfl: 0 .  lidocaine-prilocaine (EMLA) cream, Apply 1 application topically as needed. Apply small amount to port site at least 1 hour prior to  it being accessed, cover with plastic wrap, Disp: 30 g, Rfl: 1 .  oxycodone (OXY-IR) 5 MG capsule, Take 1-2 capsules (5-10 mg total) by mouth every 4 (four) hours as needed., Disp: 60 capsule, Rfl: 0 .  oxyCODONE (OXYCONTIN) 10 mg 12 hr tablet, Take 1 tablet (10 mg total) by mouth every 12 (twelve) hours., Disp: 60 tablet, Rfl: 0 .  potassium chloride SA (KLOR-CON) 20 MEQ tablet, Take 1 tablet (20 mEq total) by mouth daily., Disp: 30 tablet, Rfl: 0  Physical exam:  Vitals:   05/28/20 1103  BP: 110/75  Pulse: (!) 101  Resp: 16  Temp: 100.1 F (37.8 C)  TempSrc: Tympanic  Weight: 136 lb 12.8 oz (62.1 kg)  Height: 5' (1.524 m)   Physical Exam Constitutional:      General: She is not in acute distress. Eyes:     Pupils: Pupils are equal, round, and reactive to light.  Cardiovascular:     Rate and Rhythm: Regular rhythm. Tachycardia present.     Heart sounds: Normal heart sounds.  Pulmonary:     Effort: Pulmonary effort is normal.     Breath sounds: Normal breath sounds.  Skin:    General: Skin is warm and dry.  Neurological:     Mental Status: She is alert and oriented to person, place, and time.      CMP Latest Ref Rng & Units 05/28/2020  Glucose 70 - 99 mg/dL 136(H)  BUN 6 - 20 mg/dL 10  Creatinine 0.44 - 1.00 mg/dL 0.66  Sodium 135 - 145 mmol/L 136  Potassium 3.5 - 5.1 mmol/L 3.5  Chloride 98 - 111 mmol/L 104  CO2 22 - 32 mmol/L 23  Calcium 8.9 - 10.3 mg/dL 8.6(L)  Total Protein 6.5 - 8.1 g/dL 6.6  Total Bilirubin 0.3 - 1.2 mg/dL 0.4  Alkaline Phos 38 - 126 U/L 69  AST 15 - 41 U/L 44(H)  ALT 0 - 44 U/L 24   CBC Latest Ref Rng & Units 05/28/2020  WBC 4.0 - 10.5 K/uL 6.1  Hemoglobin 12.0 - 15.0 g/dL 11.5(L)  Hematocrit 36.0 - 46.0 % 35.0(L)  Platelets 150 - 400 K/uL 408(H)      Assessment and plan- Patient is a 40 y.o. female with metastatic ER/PR negative HER-2 positive breast cancer with bone metastases.  She is here for on treatment assessment prior to cycle  13-day 15 of Taxol  Patient has been on Taxol for close to 1 year now and is gradually developing peripheral neuropathy.  I will therefore plan to give her Taxol 2 weeks on 1 week off.  She is already receiving a reduced dose of 65 mg per metered square.  She will proceed with cycle 13-day 15 of Taxol today.  Next week she will receive Taxol Herceptin and Perjeta and I will see her back in 2 weeks for cycle 14-day 8 of Taxol alone.  Overall she has responded to treatment well and her tumor markers have normalized  Neoplasm related pain: Continue OxyContin and as needed oxycodone.  Suspect left-sided pain over the skull may be related to bone mets in the occipital bone.  Continue to monitor  Chemo-induced peripheral neuropathy: She is already on gabapentin for hot flashes which should also help her neuropathy.  We are also cutting down on the dose of Taxol as above.  Bone metastases: Last received denosumab on 05/21/2020.   Visit Diagnosis 1. Metastatic breast cancer ([place])   2. Encounter for antineoplastic chemotherapy      Dr. Randa Evens, MD, MPH [facility] [phone] 05/29/2020 6:49 PM

## 2020-06-02 ENCOUNTER — Other Ambulatory Visit: Payer: Self-pay

## 2020-06-02 ENCOUNTER — Telehealth: Payer: Self-pay

## 2020-06-02 ENCOUNTER — Inpatient Hospital Stay (HOSPITAL_BASED_OUTPATIENT_CLINIC_OR_DEPARTMENT_OTHER): Payer: Self-pay | Admitting: Oncology

## 2020-06-02 ENCOUNTER — Inpatient Hospital Stay: Payer: Self-pay

## 2020-06-02 ENCOUNTER — Ambulatory Visit
Admission: RE | Admit: 2020-06-02 | Discharge: 2020-06-02 | Disposition: A | Discharge status: Home / Self Care | Payer: Self-pay | Source: Ambulatory Visit | Attending: Oncology | Admitting: Oncology

## 2020-06-02 VITALS — BP 114/78 | HR 113 | Temp 103.1°F | Resp 18

## 2020-06-02 VITALS — BP 104/63 | HR 107 | Temp 99.8°F | Resp 18

## 2020-06-02 DIAGNOSIS — R509 Fever, unspecified: Secondary | ICD-10-CM

## 2020-06-02 DIAGNOSIS — R197 Diarrhea, unspecified: Secondary | ICD-10-CM

## 2020-06-02 DIAGNOSIS — R519 Headache, unspecified: Secondary | ICD-10-CM

## 2020-06-02 DIAGNOSIS — C50919 Malignant neoplasm of unspecified site of unspecified female breast: Secondary | ICD-10-CM

## 2020-06-02 DIAGNOSIS — Z7189 Other specified counseling: Secondary | ICD-10-CM

## 2020-06-02 LAB — CBC WITH DIFFERENTIAL/PLATELET
Abs Immature Granulocytes: 0.16 10*3/uL — ABNORMAL HIGH (ref 0.00–0.07)
Basophils Absolute: 0.1 10*3/uL (ref 0.0–0.1)
Basophils Relative: 0 %
Eosinophils Absolute: 0 10*3/uL (ref 0.0–0.5)
Eosinophils Relative: 0 %
HCT: 36.1 % (ref 36.0–46.0)
Hemoglobin: 11.9 g/dL — ABNORMAL LOW (ref 12.0–15.0)
Immature Granulocytes: 1 %
Lymphocytes Relative: 7 %
Lymphs Abs: 1.4 10*3/uL (ref 0.7–4.0)
MCH: 27.7 pg (ref 26.0–34.0)
MCHC: 33 g/dL (ref 30.0–36.0)
MCV: 84 fL (ref 80.0–100.0)
Monocytes Absolute: 1.1 10*3/uL — ABNORMAL HIGH (ref 0.1–1.0)
Monocytes Relative: 6 %
Neutro Abs: 16.4 10*3/uL — ABNORMAL HIGH (ref 1.7–7.7)
Neutrophils Relative %: 86 %
Platelets: 453 10*3/uL — ABNORMAL HIGH (ref 150–400)
RBC: 4.3 MIL/uL (ref 3.87–5.11)
RDW: 18.1 % — ABNORMAL HIGH (ref 11.5–15.5)
WBC: 19.1 10*3/uL — ABNORMAL HIGH (ref 4.0–10.5)
nRBC: 0 % (ref 0.0–0.2)

## 2020-06-02 LAB — COMPREHENSIVE METABOLIC PANEL
ALT: 28 U/L (ref 0–44)
AST: 30 U/L (ref 15–41)
Albumin: 4 g/dL (ref 3.5–5.0)
Alkaline Phosphatase: 74 U/L (ref 38–126)
Anion gap: 10 (ref 5–15)
BUN: 8 mg/dL (ref 6–20)
CO2: 26 mmol/L (ref 22–32)
Calcium: 8.7 mg/dL — ABNORMAL LOW (ref 8.9–10.3)
Chloride: 99 mmol/L (ref 98–111)
Creatinine, Ser: 0.66 mg/dL (ref 0.44–1.00)
GFR, Estimated: >60 mL/min (ref >60)
Glucose, Bld: 97 mg/dL (ref 70–99)
Potassium: 3.5 mmol/L (ref 3.5–5.1)
Sodium: 135 mmol/L (ref 135–145)
Total Bilirubin: 1.3 mg/dL — ABNORMAL HIGH (ref 0.3–1.2)
Total Protein: 7.2 g/dL (ref 6.5–8.1)

## 2020-06-02 LAB — URINALYSIS, COMPLETE (UACMP) WITH MICROSCOPIC
Bilirubin Urine: NEGATIVE
Glucose, UA: NEGATIVE mg/dL
Leukocytes,Ua: NEGATIVE
Nitrite: NEGATIVE
Protein, ur: NEGATIVE mg/dL
Specific Gravity, Urine: 1.02 (ref 1.005–1.030)
Squamous Epithelial / HPF: NONE SEEN (ref 0–5)
pH: 5.5 (ref 5.0–8.0)

## 2020-06-02 IMAGING — CR DG CHEST 2V
2 series · 2 of 2 positions shown · non-contrast
Comparison: [DATE] chest radiograph; chest CT [DATE]

CLINICAL DATA: Fever.  History of metastatic breast carcinoma

EXAM:
CHEST - 2 VIEW

[chest pa]
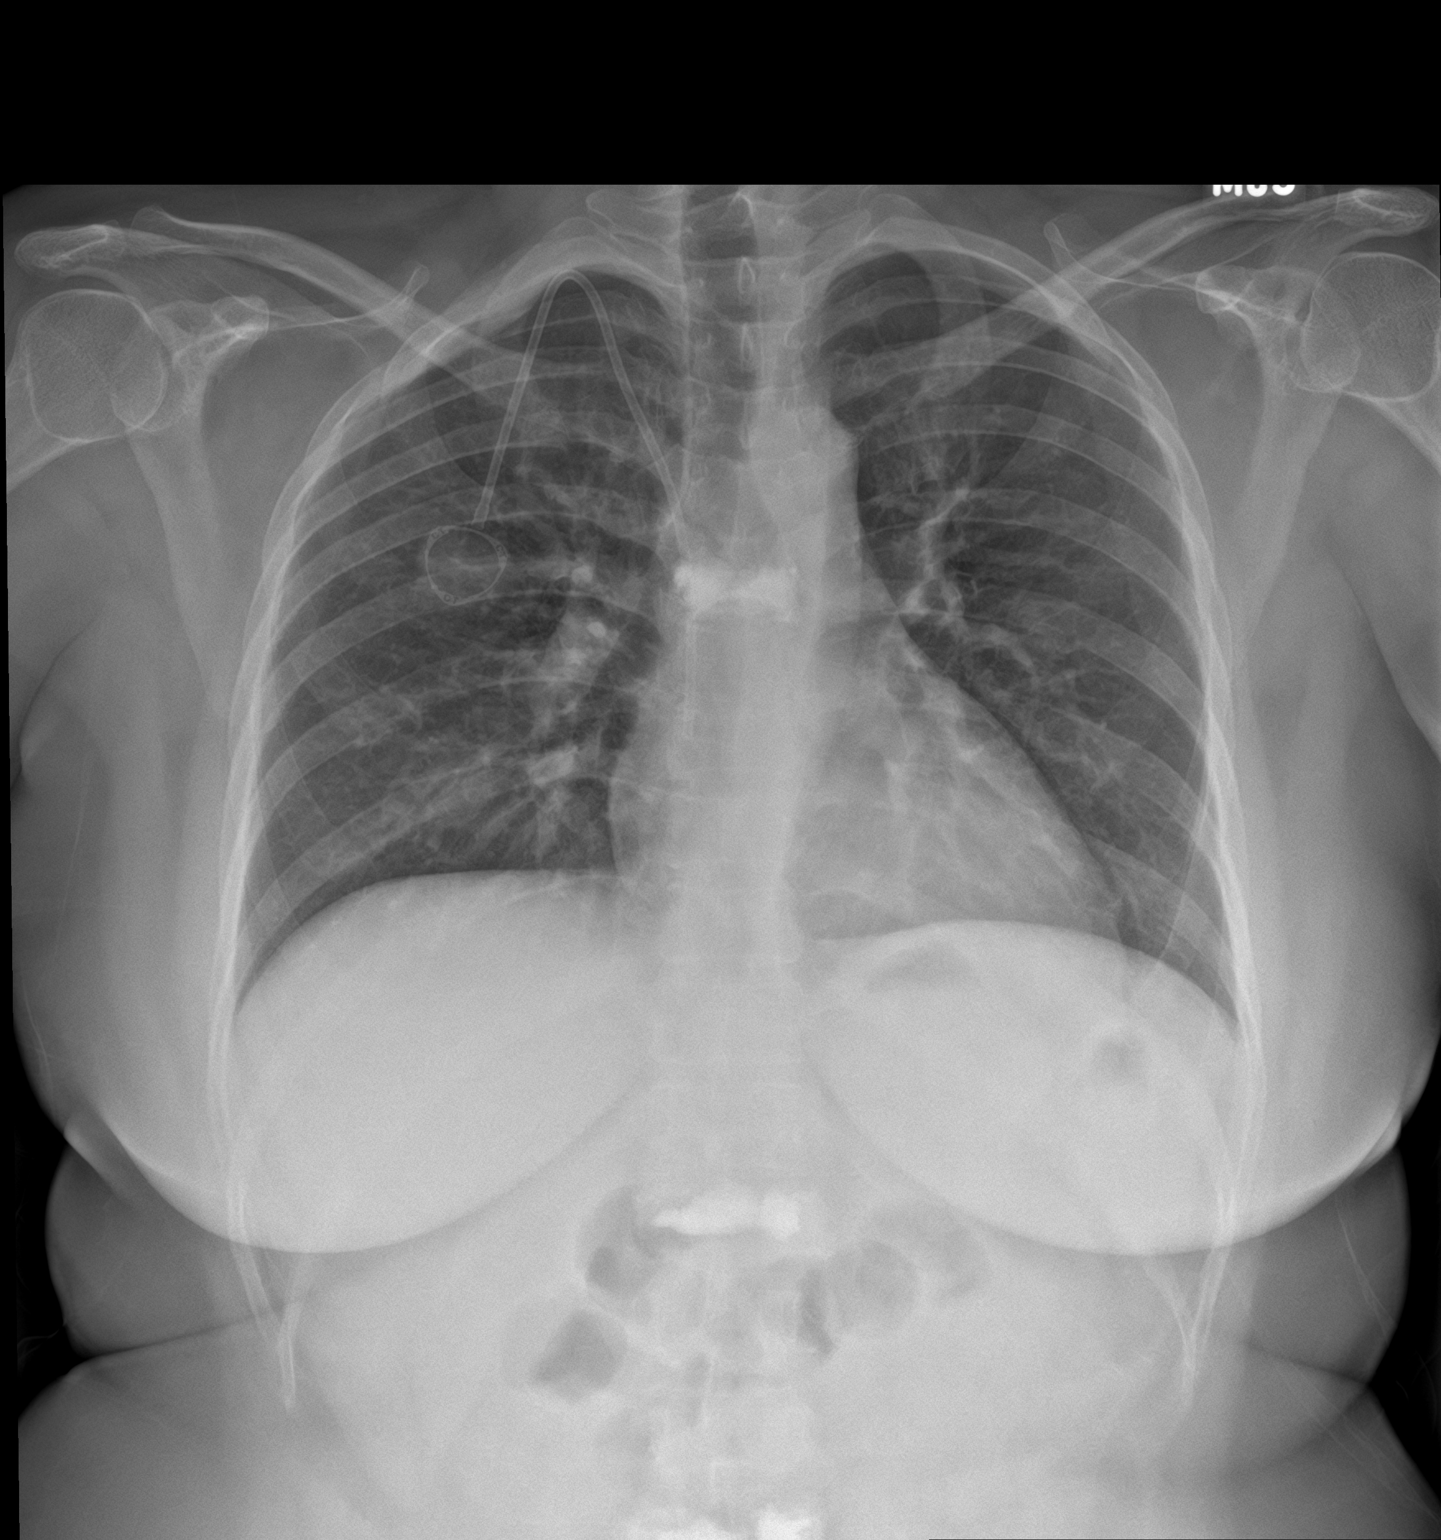

[chest lat]
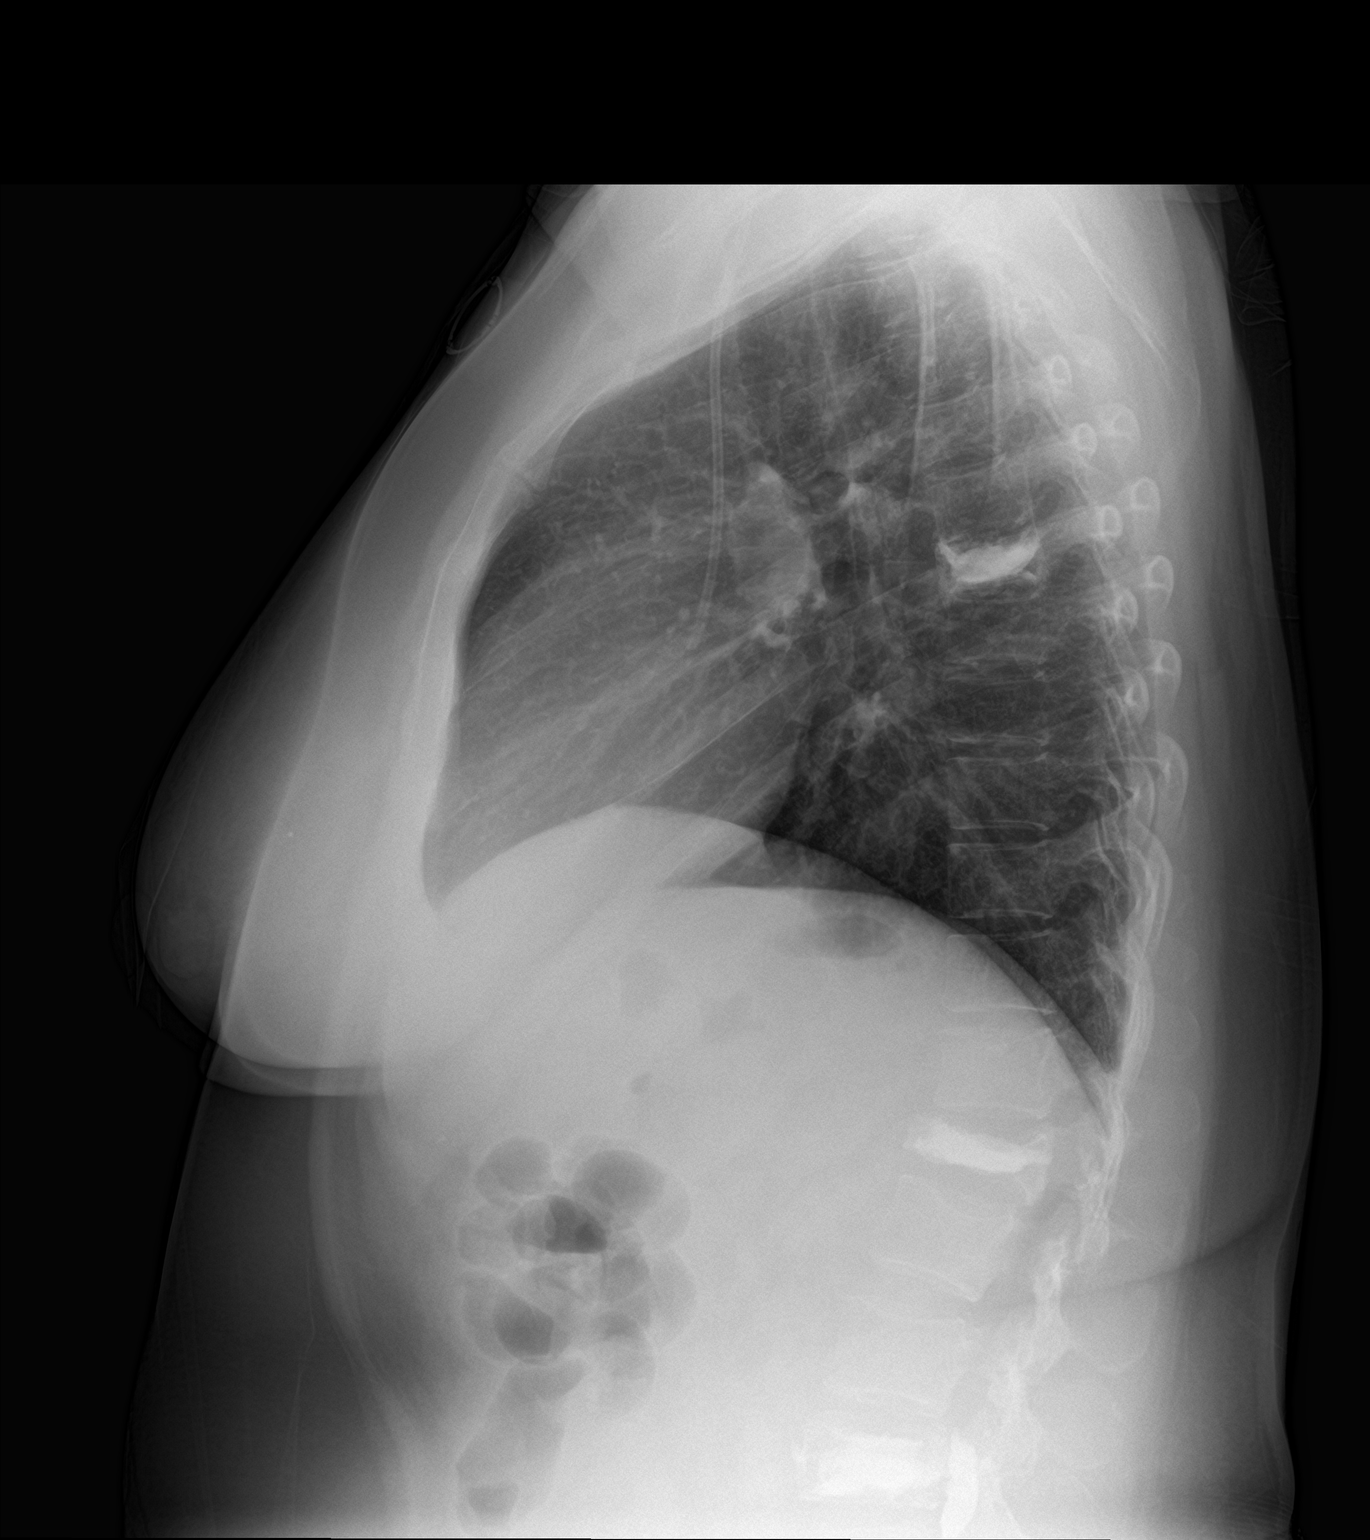

[2 of 2 positions shown; findings below may reference images not displayed]

FINDINGS: There is slight scarring in the medial left upper lobe. The lungs
elsewhere are clear. Heart size and pulmonary vascular normal. No
adenopathy. Port-A-Cath tip at cavoatrial junction. No pneumothorax.
Patient has had previous kyphoplasty procedures at T7, L2, and L5.
No blastic or lytic bone lesions evident by radiography.
IMPRESSION: Mild scarring left upper lobe. Lungs otherwise clear. Heart size
normal. No adenopathy. Previous kyphoplasty procedures at several
levels.

## 2020-06-02 MED ORDER — LEVOFLOXACIN 500 MG PO TABS: 500.0000 mg | ORAL_TABLET | Freq: Every day | ORAL | 0 refills | Status: DC

## 2020-06-02 MED ORDER — SODIUM CHLORIDE 0.9% FLUSH
10.0000 mL | Freq: Once | INTRAVENOUS | Status: AC
  Administered 2020-06-02: 10 mL via INTRAVENOUS
  Filled 2020-06-02: qty 10

## 2020-06-02 MED ORDER — ACETAMINOPHEN 500 MG PO TABS
1000.0000 mg | ORAL_TABLET | Freq: Once | ORAL | Status: AC
  Administered 2020-06-02: 1000 mg via ORAL
  Filled 2020-06-02: qty 2

## 2020-06-02 MED ORDER — IBUPROFEN 200 MG PO TABS
600.0000 mg | ORAL_TABLET | Freq: Once | ORAL | Status: AC
  Administered 2020-06-02: 600 mg via ORAL
  Filled 2020-06-02: qty 3

## 2020-06-02 MED ORDER — SODIUM CHLORIDE 0.9 % IV SOLN
INTRAVENOUS | Status: DC
  Filled 2020-06-02 (×2): qty 250

## 2020-06-02 MED ORDER — SODIUM CHLORIDE 0.9 % IV SOLN
INTRAVENOUS | Status: AC
  Filled 2020-06-02: qty 250

## 2020-06-02 MED ORDER — ONDANSETRON HCL 4 MG/2ML IJ SOLN
8.0000 mg | Freq: Once | INTRAMUSCULAR | Status: AC
  Administered 2020-06-02: 8 mg via INTRAVENOUS
  Filled 2020-06-02: qty 4

## 2020-06-02 MED ORDER — HEPARIN SOD (PORK) LOCK FLUSH 100 UNIT/ML IV SOLN
500.0000 [IU] | Freq: Once | INTRAVENOUS | Status: AC
  Administered 2020-06-02: 500 [IU] via INTRAVENOUS
  Filled 2020-06-02: qty 5

## 2020-06-02 MED ORDER — ONDANSETRON HCL 4 MG/2ML IJ SOLN: 8.0000 mg | Freq: Once | INTRAMUSCULAR | Status: DC

## 2020-06-02 MED ORDER — ACETAMINOPHEN 500 MG PO TABS: 1000.0000 mg | ORAL_TABLET | Freq: Once | ORAL | Status: DC

## 2020-06-02 NOTE — Progress Notes (Signed)
Symptom Management Consult note [facility]  Telephone:([phone] [phone]  Patient Care Team: [facility], [place], NP as PCP - General Rico Junker, RN as Registered Nurse Theodore Demark, RN as Registered Nurse Sindy Guadeloupe, MD as Consulting Physician (Hematology and Oncology)   Name of the patient: Sophia [name]  [id number]  Feb 21, 1981   Date of visit: 06/02/2020   Diagnosis- Breast cancer with mets to bone  Chief complaint/ Reason for visit- Fever, abdominal pain, diarrhea, headache  Heme/Onc history: Sophia [name] is a 40 year old female with past medical history significant for anxiety and leukocytosis who is followed by Dr. Janese Banks for metastatic breast cancer.  She is currently status post 13 cycles of Taxol plus Perjeta and Kanjinti.  She received Taxol on 05/28/2020.  PET and MRI showed 3 areas of pathological fracture of her spine as well as widespread bony metastasis disease and concern for impending fracture of her right hip.  She had kyphoplasty at 3 different levels and prophylactic fixation of right hip.  She started her first dose of Herceptin and Perjeta on 09/04/2019.  She has history of acute colitis in July 2021.  Colonoscopy showed few severe inflammation with erythema friability and loss of vascularity and shallow ulcerations in the ascending colon and cecum.  She was treated conservatively and symptoms resolved on their own.  Interval history-today, Sophia [name] presents with a fever, generalized body aches, intermittent abdominal pain and diarrhea.  She has had intermittent diarrhea secondary to chemo treatments for which she takes Imodium.  Typically, this helps her diarrhea.  She has noticed over the past week or so more diarrhea associated with food intake.  Has some mild epigastric pain prior to bowel movement.  Endorses a fever that started last night.  Associated symptoms include headache and intermittent  nausea without vomiting.  She denies any urinary symptoms.  Denies any cough or wheezing.  Denies chest pain.  She has taken Imodium and OxyContin without relief.   ECOG FS:2 - Symptomatic, <50% confined to bed  Review of systems- Review of Systems  Constitutional: Positive for fever and malaise/fatigue. Negative for chills and weight loss.  HENT: Negative for congestion, ear pain and tinnitus.   Eyes: Negative.  Negative for blurred vision and double vision.  Respiratory: Negative for cough, sputum production, shortness of breath and wheezing.   Cardiovascular: Negative.  Negative for chest pain, palpitations and leg swelling.  Gastrointestinal: Positive for diarrhea. Negative for abdominal pain, constipation, nausea and vomiting.  Genitourinary: Negative for dysuria, frequency and urgency.  Musculoskeletal: Negative for back pain and falls.  Skin: Negative.  Negative for rash.  Neurological: Positive for weakness and headaches.  Endo/Heme/Allergies: Negative.  Does not bruise/bleed easily.  Psychiatric/Behavioral: Negative for depression. The patient is nervous/anxious. The patient does not have insomnia.      Current treatment-status post 13 cycles of Herceptin Perjeta and Taxol.  Last given on 05/28/2020.  No Known Allergies   Past Medical History:  Diagnosis Date  . Anxiety   . Breast cancer ([place])    with mets  . Cancer ([place])   . Colitis   . Family history of colon cancer   . Vertigo      Past Surgical History:  Procedure Laterality Date  . BREAST BIOPSY Left 08/14/2019   [place] bx of mass, coil marker, path pending  . BREAST BIOPSY Left 08/14/2019   [place] bx of LN, hydromarker, path pending  . BREAST  BIOPSY Left 08/14/2019   affirm bx of calcs, x marker, path pending  . ESOPHAGOGASTRODUODENOSCOPY (EGD) WITH PROPOFOL N/A 10/05/2019   Procedure: ESOPHAGOGASTRODUODENOSCOPY (EGD) WITH PROPOFOL;  Surgeon: Lin Landsman, MD;  Location: [facility];  Service:  Gastroenterology;  Laterality: N/A;  . FLEXIBLE SIGMOIDOSCOPY N/A 10/05/2019   Procedure: FLEXIBLE SIGMOIDOSCOPY;  Surgeon: Lin Landsman, MD;  Location: [facility];  Service: Gastroenterology;  Laterality: N/A;  . INTRAMEDULLARY (IM) NAIL INTERTROCHANTERIC Right 09/01/2019   Procedure: INTRAMEDULLARY (IM) NAIL INTERTROCHANTRIC AND RADIOFREQUENCY ABLATION;  Surgeon: Hessie Knows, MD;  Location: [facility];  Service: Orthopedics;  Laterality: Right;  . KYPHOPLASTY N/A 08/29/2019   Procedure: KYPHOPLASTY T6, L1,L4 ,  RADIOFREQUENCY ABLATION;  Surgeon: Hessie Knows, MD;  Location: [facility];  Service: Orthopedics;  Laterality: N/A;  . KYPHOPLASTY Right 09/01/2019   Procedure: Right Sacral Radiofrequency Ablation and Cement Augmentation, Right sacrum and iliac crest;  Surgeon: Hessie Knows, MD;  Location: [facility];  Service: Orthopedics;  Laterality: Right;  . PORTA CATH INSERTION N/A 08/28/2019   Procedure: PORTA CATH INSERTION;  Surgeon: Algernon Huxley, MD;  Location: [facility];  Service: Cardiovascular;  Laterality: N/A;    Social History   Socioeconomic History  . Marital status: Single    Spouse name: Not on file  . Number of children: Not on file  . Years of education: Not on file  . Highest education level: Not on file  Occupational History  . Not on file  Tobacco Use  . Smoking status: Never Smoker  . Smokeless tobacco: Never Used  Vaping Use  . Vaping Use: Never used  Substance and Sexual Activity  . Alcohol use: Not Currently  . Drug use: Not Currently  . Sexual activity: Not Currently    Birth control/protection: None  Other Topics Concern  . Not on file  Social History Narrative   Lives at home with children   Social Determinants of Health   Financial Resource Strain: Not on file  Food Insecurity: Not on file  Transportation Needs: Not on file  Physical Activity: Not on file  Stress: Not on file  Social Connections: Not on file  Intimate Partner  Violence: Not on file    Family History  Problem Relation Age of Onset  . Colon cancer Maternal Uncle      Current Outpatient Medications:  .  levofloxacin (LEVAQUIN) 500 MG tablet, Take 1 tablet (500 mg total) by mouth daily., Disp: 7 tablet, Rfl: 0 .  clindamycin (CLINDAGEL) 1 % gel, Apply topically 2 (two) times daily. X 7 days., Disp: 30 g, Rfl: 0 .  diphenoxylate-atropine (LOMOTIL) 2.5-0.025 MG tablet, Take 1 tablet by mouth 4 (four) times daily as needed for diarrhea or loose stools., Disp: 40 tablet, Rfl: 0 .  FLUoxetine (PROZAC) 10 MG capsule, Take 1 capsule (10 mg total) by mouth daily., Disp: 30 capsule, Rfl: 3 .  gabapentin (NEURONTIN) 300 MG capsule, Take 1 capsule (300 mg total) by mouth 3 (three) times daily., Disp: 180 capsule, Rfl: 0 .  lidocaine-prilocaine (EMLA) cream, Apply 1 application topically as needed. Apply small amount to port site at least 1 hour prior to it being accessed, cover with plastic wrap, Disp: 30 g, Rfl: 1 .  oxycodone (OXY-IR) 5 MG capsule, Take 1-2 capsules (5-10 mg total) by mouth every 4 (four) hours as needed., Disp: 60 capsule, Rfl: 0 .  oxyCODONE (OXYCONTIN) 10 mg 12 hr tablet, Take 1 tablet (10 mg total) by mouth every 12 (  twelve) hours., Disp: 60 tablet, Rfl: 0 .  potassium chloride SA (KLOR-CON) 20 MEQ tablet, Take 1 tablet (20 mEq total) by mouth daily., Disp: 30 tablet, Rfl: 0 .  vancomycin (VANCOCIN) 125 MG capsule, Take 1 capsule (125 mg total) by mouth 4 (four) times daily for 10 days. For C Diff infection, Disp: 40 capsule, Rfl: 0  Current Facility-Administered Medications:  .  0.9 %  sodium chloride infusion, , Intravenous, Continuous, Trust Crago, Wandra Feinstein, NP, Stopped at 06/02/20 1421  Facility-Administered Medications Ordered in Other Visits:  .  0.9 %  sodium chloride infusion, , Intravenous, Continuous, Kenry Daubert, Wandra Feinstein, NP, Stopped at 06/02/20 1453 .  0.9 %  sodium chloride infusion, , Intravenous, Continuous, Verlon Au, NP,  Last Rate: 999 mL/hr at 06/03/20 1159, New Bag at 06/03/20 1159 .  Vancomycin HCl SOLR 125 mg, 125 mg, Oral, Once, Verlon Au, NP  Physical exam:  Vitals:   06/02/20 1231  BP: 114/78  Pulse: (!) 113  Resp: 18  Temp: (!) 103.1 F (39.5 C)  TempSrc: Tympanic  SpO2: 100%   Physical Exam Constitutional:      Appearance: Normal appearance.  HENT:     Head: Normocephalic and atraumatic.  Eyes:     Pupils: Pupils are equal, round, and reactive to light.  Cardiovascular:     Rate and Rhythm: Normal rate and regular rhythm.     Heart sounds: Normal heart sounds. No murmur heard.   Pulmonary:     Effort: Pulmonary effort is normal.     Breath sounds: Normal breath sounds. No wheezing.  Abdominal:     General: Bowel sounds are normal. There is no distension.     Palpations: Abdomen is soft.     Tenderness: There is abdominal tenderness in the epigastric area.  Musculoskeletal:        General: Normal range of motion.     Cervical back: Normal range of motion.  Skin:    General: Skin is warm and dry.     Findings: No rash.  Neurological:     Mental Status: She is alert and oriented to person, place, and time.  Psychiatric:        Judgment: Judgment normal.      CMP Latest Ref Rng & Units 06/03/2020  Glucose 70 - 99 mg/dL 98  BUN 6 - 20 mg/dL 7  Creatinine 0.44 - 1.00 mg/dL 0.52  Sodium 135 - 145 mmol/L 135  Potassium 3.5 - 5.1 mmol/L 3.3(L)  Chloride 98 - 111 mmol/L 105  CO2 22 - 32 mmol/L 21(L)  Calcium 8.9 - 10.3 mg/dL 8.1(L)  Total Protein 6.5 - 8.1 g/dL 6.6  Total Bilirubin 0.3 - 1.2 mg/dL 1.4(H)  Alkaline Phos 38 - 126 U/L 67  AST 15 - 41 U/L 23  ALT 0 - 44 U/L 23   CBC Latest Ref Rng & Units 06/03/2020  WBC 4.0 - 10.5 K/uL 12.6(H)  Hemoglobin 12.0 - 15.0 g/dL 10.7(L)  Hematocrit 36.0 - 46.0 % 33.5(L)  Platelets 150 - 400 K/uL 426(H)    No images are attached to the encounter.  DG Chest 2 View  Result Date: 06/02/2020 CLINICAL DATA:  Fever.  History  of metastatic breast carcinoma EXAM: CHEST - 2 VIEW COMPARISON:  October 01, 2019 chest radiograph; chest CT April 01, 2020 FINDINGS: There is slight scarring in the medial left upper lobe. The lungs elsewhere are clear. Heart size and pulmonary vascular normal. No adenopathy. Port-A-Cath tip at cavoatrial  junction. No pneumothorax. Patient has had previous kyphoplasty procedures at T7, L2, and L5. No blastic or lytic bone lesions evident by radiography. IMPRESSION: Mild scarring left upper lobe. Lungs otherwise clear. Heart size normal. No adenopathy. Previous kyphoplasty procedures at several levels. Electronically Signed   By: Lowella Grip III M.D.   On: 06/02/2020 11:23   CT Abdomen Pelvis W Contrast  Result Date: 06/03/2020 CLINICAL DATA:  Concern for gastroenteritis. Abdominal pain. History of breast cancer. Chemotherapy ongoing. EXAM: CT ABDOMEN AND PELVIS WITH CONTRAST TECHNIQUE: Multidetector CT imaging of the abdomen and pelvis was performed using the standard protocol following bolus administration of intravenous contrast. CONTRAST:  167mL OMNIPAQUE IOHEXOL 300 MG/ML  SOLN COMPARISON:  CT 04/01/2020 FINDINGS: Lower chest: Lung bases are clear. Hepatobiliary: No focal hepatic lesion. No biliary duct dilatation. Common bile duct is normal. Pancreas: Pancreas is normal. No ductal dilatation. No pancreatic inflammation. Spleen: Normal spleen Adrenals/urinary tract: Adrenal glands and kidneys are normal. The ureters and bladder normal. Stomach/Bowel: Stomach, duodenum small-bowel normal. No evidence of small bowel inflammation or obstruction. Terminal ileum is normal. Appendix is normal. The entire colon is collapsed. Sigmoid colon and rectum are collapsed. No clear evidence of colonic inflammation. No mesenteric inflammation. Vascular/Lymphatic: Abdominal aorta is normal caliber. No periportal or retroperitoneal adenopathy. No pelvic adenopathy. Reproductive: Uterus and adnexa unremarkable. Other: No  free fluid.  No peritoneal disease Musculoskeletal: No aggressive osseous lesion. Chronic compression fracture of the lumbar spine with augmentation. Round sclerotic lesions at T12 and T10 are not changed from comparison CT. Additional smaller lesions in the lumbar spine. The lesions are not hypermetabolic on comparison PET-CT scan 12/16/2019. Sacral plasty in the RIGHT sacral ala. IMPRESSION: 1. No evidence of inflammation or obstruction the small bowel or colon. 2. Entire colon is collapsed without stool. 3. Gallbladder appendix are normal. 4. Round sclerotic skeletal lesions again noted. No interval change. Electronically Signed   By: Suzy Bouchard M.D.   On: 06/03/2020 11:30     Assessment and plan- Patient is a 40 y.o. female who presents to symptom management for concerns of several days of fever, diarrhea and intermittent abdominal pain.  Metastatic breast cancer: -She is status post 13 cycles of Herceptin, Perjeta and Taxol.  Last treatment given on 05/28/2020 (Taxol only). -PET scan and MRI showed 3 areas of pathological fracture of her spine as well as widespread bony metastasis disease concerning for impending fracture of right hip.  She received a kyphoplasty at 3 different levels and underwent prophylactic fixation of right hip. -She has done fairly well with her treatments except for some gradual peripheral neuropathy. -She is scheduled for treatment on Friday, 06/04/2020.  Fever: -Unclear etiology but will check labs including urinalysis and chest x-ray. -Chest x-ray was negative for acute process. -Urinalysis shows trace blood and ketones.  Rare bacteria. -Lab work consistent with infection-leukocytosis WBC 19.1 -We will give Tylenol 650 mg while in clinic for fever.   -Fever resolved with Tylenol -Spoke with Dr. Janese Banks who recommends Levaquin 500 mg daily x7 days.  Diarrhea-intermittent epigastric pain: -Appears to be chronic but will check stool sample to rule out GI  origin. -Has history of ischemic colitis. -Patient unable to produce in clinic but will bring sample back once collected at home.  Nausea: -Also appears to be chronic but worsened over the past 24 to 48 hours. -We will give 8 mg Zofran while in clinic. -Continue antiemetics at home.  Headache:  -Secondary to fever and dehydration. -We will  rehydrate with 1 L normal saline.   Disposition: -RTC on Friday, 06/04/2020 for repeat labs and possible IV fluids. -Keep all other scheduled appointments   Visit Diagnosis 1. Fever, unspecified fever cause   2. Diarrhea, unspecified type   3. Nonintractable headache, unspecified chronicity pattern, unspecified headache type     Patient expressed understanding and was in agreement with this plan. She also understands that She can call clinic at any time with any questions, concerns, or complaints.   Greater than 50% was spent in counseling and coordination of care with this patient including but not limited to discussion of the relevant topics above (See A&P) including, but not limited to diagnosis and management of acute and chronic medical conditions.   Thank you for allowing me to participate in the care of this very pleasant patient.    Jacquelin Hawking, NP [place] [facility] Cell - [phone] Pager- [phone] 06/03/2020 2:57 PM

## 2020-06-02 NOTE — Telephone Encounter (Signed)
F/U with pt per Dr. Elroy Channel request to be seen in [facility] for fever. Pt reports that she has had fever and diarrhea. Patient scheduled for appointment in [facility] at 11am. She verbalized understanding.

## 2020-06-02 NOTE — Patient Instructions (Signed)
I am sorry you are not feeling well today.  You are scheduled for treatment on Friday, 06/04/2020.  We will keep this appointment and at least check your labs to see how you are doing.  I have sent a prescription for an antibiotic to your pharmacy.  You are to take 1 tablet daily starting today.  Please take Tylenol as needed for fever.  When you are able, please bring back a stool sample so that we may check for infection.  If any of your symptoms worsen, please call clinic for [place] to see you sooner.

## 2020-06-03 ENCOUNTER — Inpatient Hospital Stay: Payer: Self-pay

## 2020-06-03 ENCOUNTER — Other Ambulatory Visit: Payer: Self-pay | Admitting: Nurse Practitioner

## 2020-06-03 ENCOUNTER — Telehealth: Payer: Self-pay | Admitting: *Deleted

## 2020-06-03 ENCOUNTER — Other Ambulatory Visit: Payer: Self-pay

## 2020-06-03 ENCOUNTER — Ambulatory Visit
Admission: RE | Admit: 2020-06-03 | Discharge: 2020-06-03 | Disposition: A | Discharge status: Home / Self Care | Payer: Self-pay | Source: Ambulatory Visit | Attending: Nurse Practitioner | Admitting: Nurse Practitioner

## 2020-06-03 ENCOUNTER — Inpatient Hospital Stay (HOSPITAL_BASED_OUTPATIENT_CLINIC_OR_DEPARTMENT_OTHER): Payer: Self-pay | Admitting: Nurse Practitioner

## 2020-06-03 VITALS — Temp 99.7°F

## 2020-06-03 VITALS — BP 111/81 | HR 103 | Temp 100.5°F | Resp 17

## 2020-06-03 DIAGNOSIS — A498 Other bacterial infections of unspecified site: Secondary | ICD-10-CM

## 2020-06-03 DIAGNOSIS — R509 Fever, unspecified: Secondary | ICD-10-CM

## 2020-06-03 DIAGNOSIS — C50919 Malignant neoplasm of unspecified site of unspecified female breast: Secondary | ICD-10-CM

## 2020-06-03 DIAGNOSIS — A09 Infectious gastroenteritis and colitis, unspecified: Secondary | ICD-10-CM

## 2020-06-03 DIAGNOSIS — K529 Noninfective gastroenteritis and colitis, unspecified: Secondary | ICD-10-CM

## 2020-06-03 DIAGNOSIS — A0472 Enterocolitis due to Clostridium difficile, not specified as recurrent: Secondary | ICD-10-CM

## 2020-06-03 LAB — CBC WITH DIFFERENTIAL/PLATELET
Abs Immature Granulocytes: 0.15 10*3/uL — ABNORMAL HIGH (ref 0.00–0.07)
Basophils Absolute: 0.1 10*3/uL (ref 0.0–0.1)
Basophils Relative: 1 %
Eosinophils Absolute: 0 10*3/uL (ref 0.0–0.5)
Eosinophils Relative: 0 %
HCT: 33.5 % — ABNORMAL LOW (ref 36.0–46.0)
Hemoglobin: 10.7 g/dL — ABNORMAL LOW (ref 12.0–15.0)
Immature Granulocytes: 1 %
Lymphocytes Relative: 7 %
Lymphs Abs: 0.9 10*3/uL (ref 0.7–4.0)
MCH: 27.1 pg (ref 26.0–34.0)
MCHC: 31.9 g/dL (ref 30.0–36.0)
MCV: 84.8 fL (ref 80.0–100.0)
Monocytes Absolute: 0.8 10*3/uL (ref 0.1–1.0)
Monocytes Relative: 6 %
Neutro Abs: 10.6 10*3/uL — ABNORMAL HIGH (ref 1.7–7.7)
Neutrophils Relative %: 85 %
Platelets: 426 10*3/uL — ABNORMAL HIGH (ref 150–400)
RBC: 3.95 MIL/uL (ref 3.87–5.11)
RDW: 17.8 % — ABNORMAL HIGH (ref 11.5–15.5)
Smear Review: NORMAL
WBC: 12.6 10*3/uL — ABNORMAL HIGH (ref 4.0–10.5)
nRBC: 0 % (ref 0.0–0.2)

## 2020-06-03 LAB — GASTROINTESTINAL PANEL BY PCR, STOOL (REPLACES STOOL CULTURE)

## 2020-06-03 LAB — COMPREHENSIVE METABOLIC PANEL
ALT: 23 U/L (ref 0–44)
AST: 23 U/L (ref 15–41)
Albumin: 3.3 g/dL — ABNORMAL LOW (ref 3.5–5.0)
Alkaline Phosphatase: 67 U/L (ref 38–126)
Anion gap: 9 (ref 5–15)
BUN: 7 mg/dL (ref 6–20)
CO2: 21 mmol/L — ABNORMAL LOW (ref 22–32)
Calcium: 8.1 mg/dL — ABNORMAL LOW (ref 8.9–10.3)
Chloride: 105 mmol/L (ref 98–111)
Creatinine, Ser: 0.52 mg/dL (ref 0.44–1.00)
GFR, Estimated: >60 mL/min (ref >60)
Glucose, Bld: 98 mg/dL (ref 70–99)
Potassium: 3.3 mmol/L — ABNORMAL LOW (ref 3.5–5.1)
Sodium: 135 mmol/L (ref 135–145)
Total Bilirubin: 1.4 mg/dL — ABNORMAL HIGH (ref 0.3–1.2)
Total Protein: 6.6 g/dL (ref 6.5–8.1)

## 2020-06-03 LAB — URINE CULTURE: Culture: NO GROWTH

## 2020-06-03 LAB — C DIFFICILE QUICK SCREEN W PCR REFLEX
C Diff antigen: POSITIVE — AB
C Diff interpretation: DETECTED
C Diff toxin: POSITIVE — AB

## 2020-06-03 IMAGING — CT CT ABD-PELV W/ CM
2 of 5 series · 15 of 46 positions shown, 17 images · IV contrast (APPLIED)
Comparison: CT [DATE]

CLINICAL DATA: Concern for gastroenteritis. Abdominal pain. History
of breast cancer. Chemotherapy ongoing.

EXAM:
CT ABDOMEN AND PELVIS WITH CONTRAST
TECHNIQUE: Multidetector CT imaging of the abdomen and pelvis was performed
using the standard protocol following bolus administration of
intravenous contrast.
CONTRAST:  100mL OMNIPAQUE IOHEXOL 300 MG/ML  SOLN

[Series 2: axial st · axial · 0.69mm/px · z∈[-412,-32]mm · 12 of 86 slices shown, 14 images]
[im 5/86  soft-tissue]
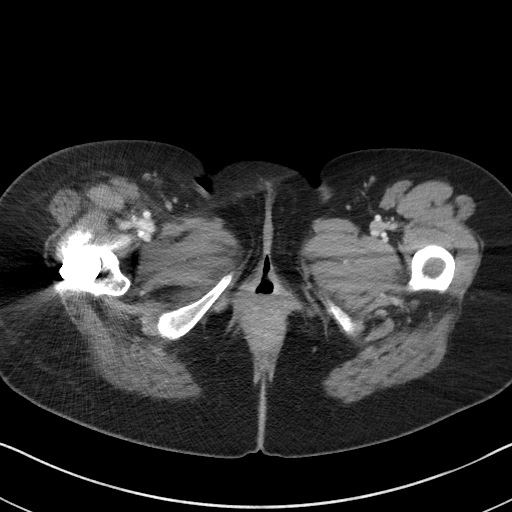
[im 5/86  bone]
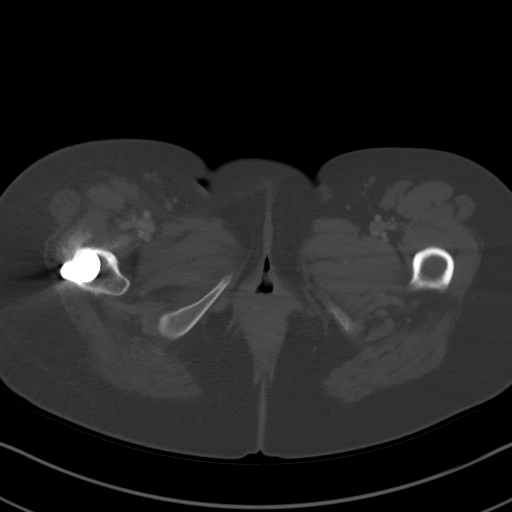
[im 15/86  soft-tissue]
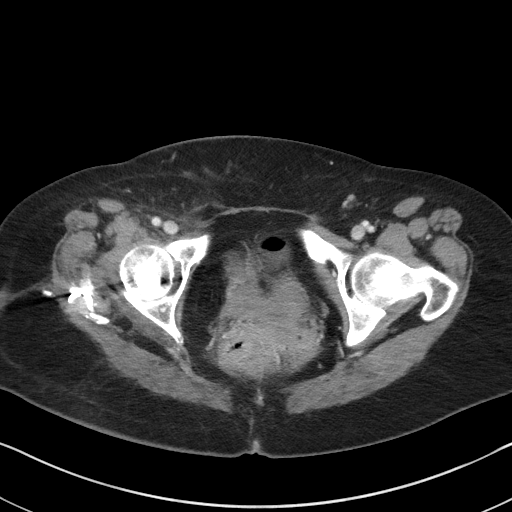
[im 19/86  soft-tissue]
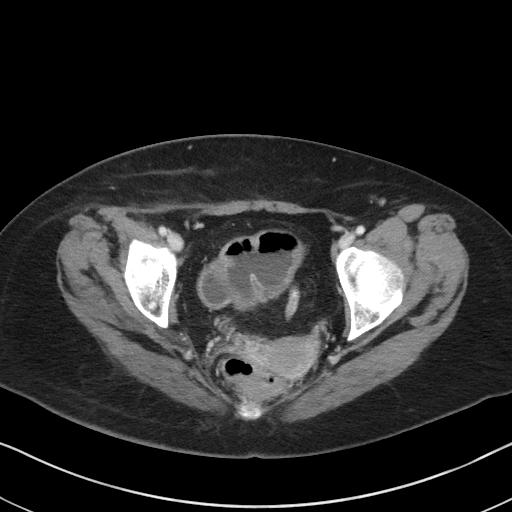
[im 24/86  soft-tissue]
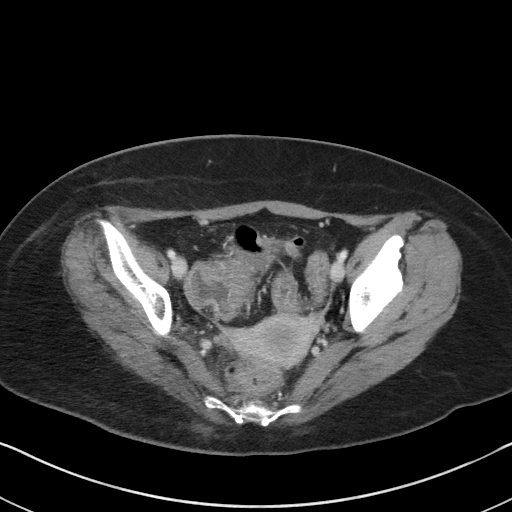
[im 34/86  soft-tissue]
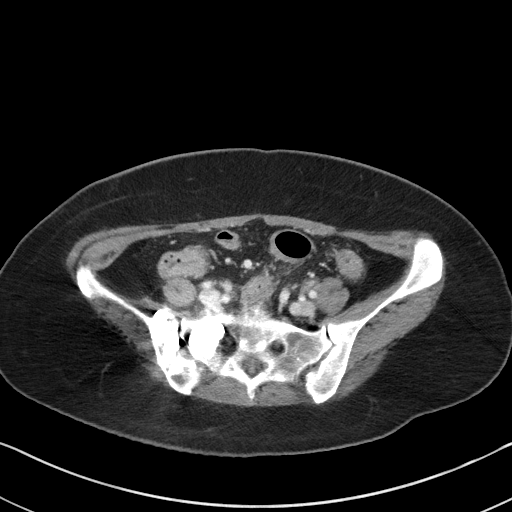
[im 38/86  soft-tissue]
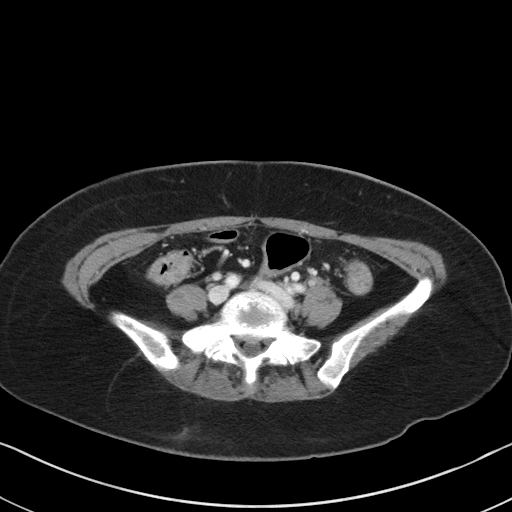
[im 48/86  soft-tissue]
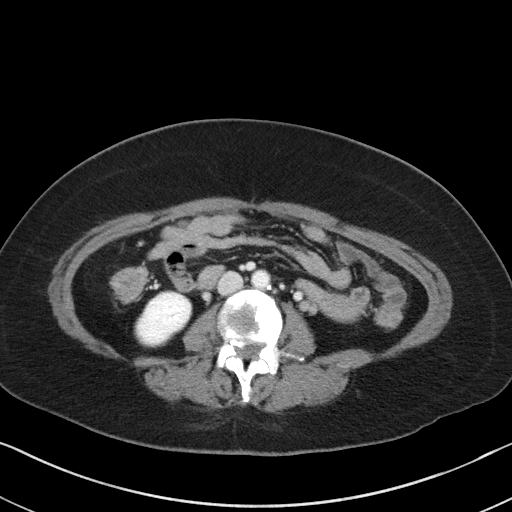
[im 52/86  soft-tissue]
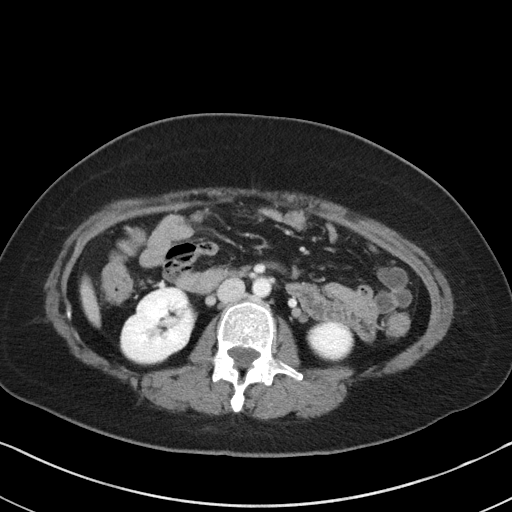
[im 62/86  soft-tissue]
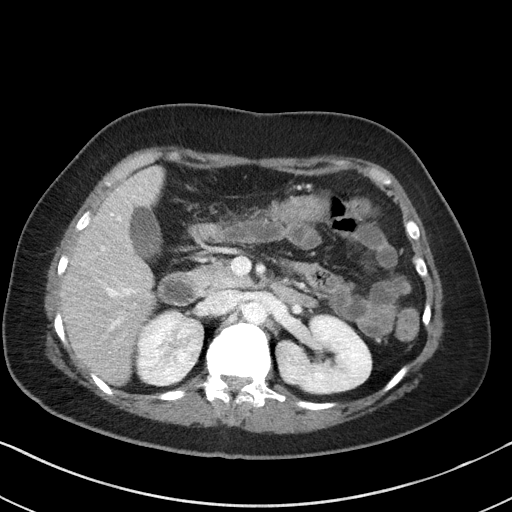
[im 62/86  bone]
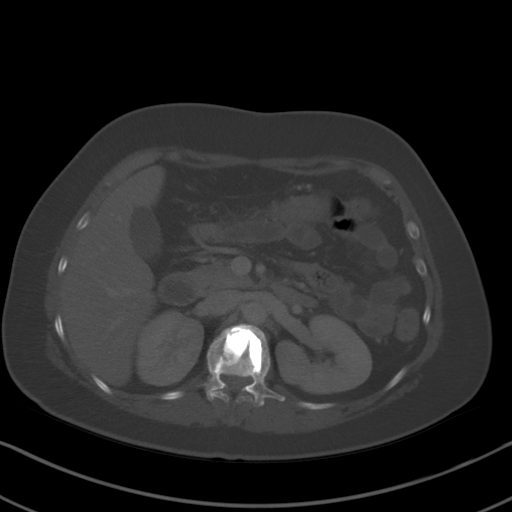
[im 67/86  soft-tissue]
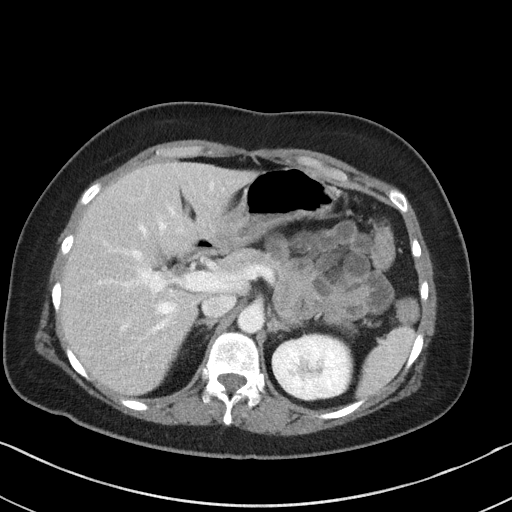
[im 71/86  soft-tissue]
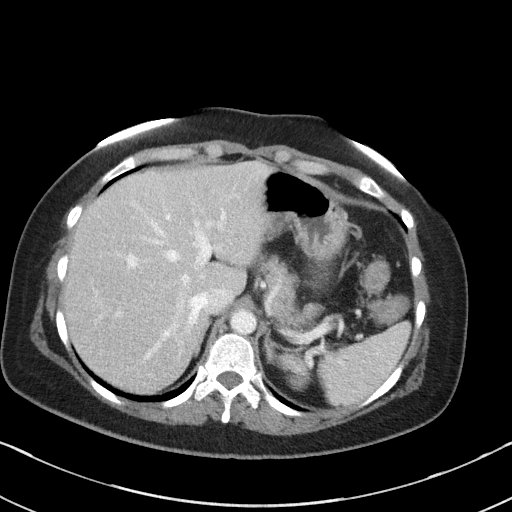
[im 81/86  soft-tissue]
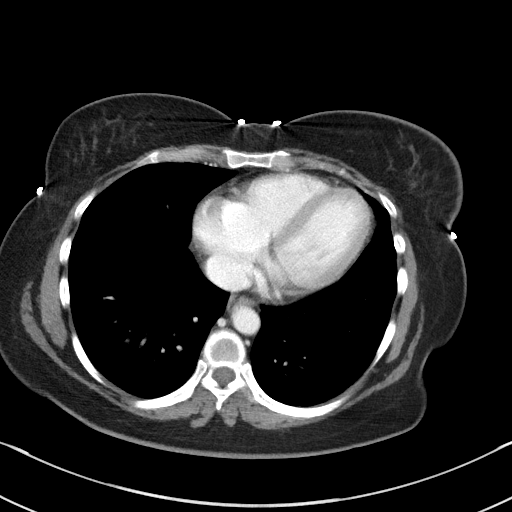

[Series 5: coronal st · coronal · 0.63mm/px · 3 of 81 slices shown]
[im 27/81  soft-tissue]
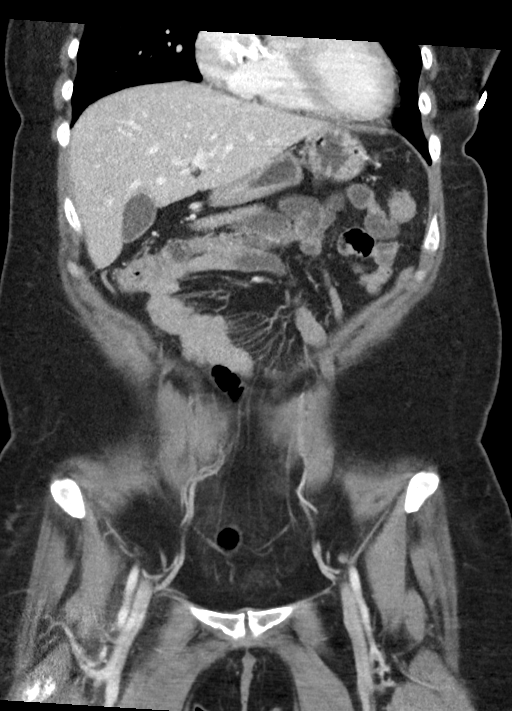
[im 36/81  soft-tissue]
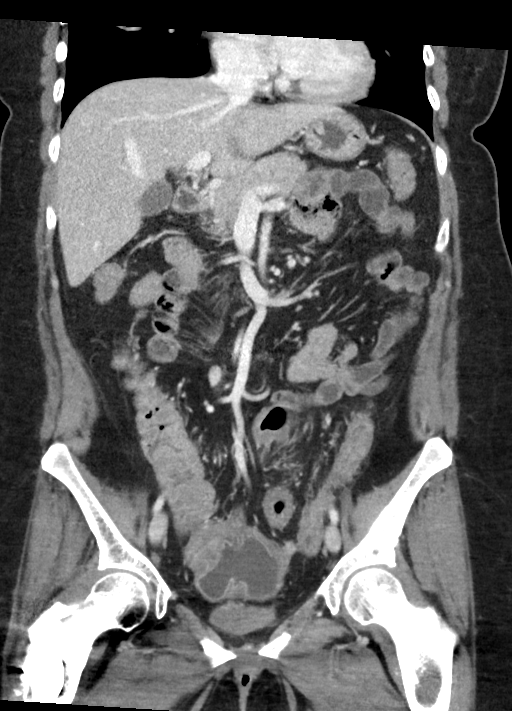
[im 45/81  soft-tissue]
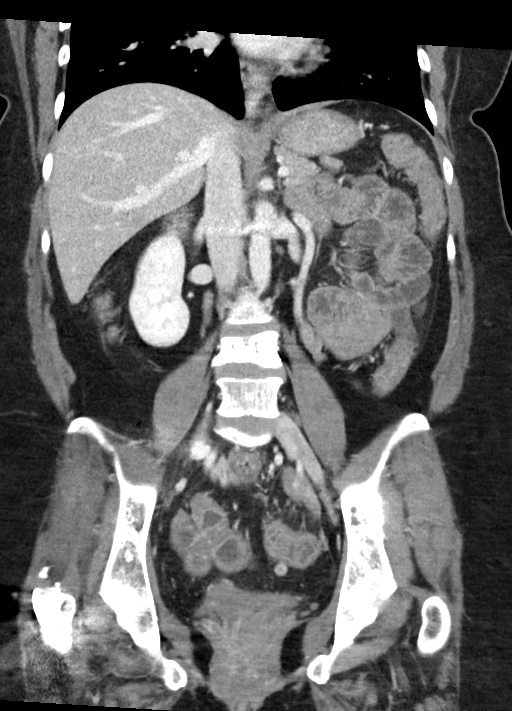

[15 of 46 positions shown; findings below may reference images not displayed]

FINDINGS: Lower chest: Lung bases are clear.

Hepatobiliary: No focal hepatic lesion. No biliary duct dilatation.
Common bile duct is normal.

Pancreas: Pancreas is normal. No ductal dilatation. No pancreatic
inflammation.

Spleen: Normal spleen

Adrenals/urinary tract: Adrenal glands and kidneys are normal. The
ureters and bladder normal.

Stomach/Bowel: Stomach, duodenum small-bowel normal. No evidence of
small bowel inflammation or obstruction. Terminal ileum is normal.
Appendix is normal. The entire colon is collapsed. Sigmoid colon and
rectum are collapsed. No clear evidence of colonic inflammation. No
mesenteric inflammation.

Vascular/Lymphatic: Abdominal aorta is normal caliber. No periportal
or retroperitoneal adenopathy. No pelvic adenopathy.

Reproductive: Uterus and adnexa unremarkable.

Other: No free fluid.  No peritoneal disease

Musculoskeletal: No aggressive osseous lesion. Chronic compression
fracture of the lumbar spine with augmentation. Round sclerotic
lesions at T12 and T10 are not changed from comparison CT.
Additional smaller lesions in the lumbar spine. The lesions are not
hypermetabolic on comparison PET-CT scan [DATE]. Sacral plasty
in the RIGHT sacral ala.
IMPRESSION: 1. No evidence of inflammation or obstruction the small bowel or
colon.
2. Entire colon is collapsed without stool.
3. Gallbladder appendix are normal.
4. Round sclerotic skeletal lesions again noted. No interval change.

## 2020-06-03 MED ORDER — VANCOMYCIN HCL 125 MG PO CAPS: 125.0000 mg | ORAL_CAPSULE | Freq: Four times a day (QID) | ORAL | 0 refills | Status: AC

## 2020-06-03 MED ORDER — SODIUM CHLORIDE 0.9% FLUSH
10.0000 mL | Freq: Once | INTRAVENOUS | Status: DC
  Filled 2020-06-03: qty 10

## 2020-06-03 MED ORDER — ONDANSETRON HCL 4 MG/2ML IJ SOLN
8.0000 mg | Freq: Once | INTRAMUSCULAR | Status: AC
  Administered 2020-06-03: 8 mg via INTRAVENOUS
  Filled 2020-06-03: qty 4

## 2020-06-03 MED ORDER — SODIUM CHLORIDE 0.9 % IV SOLN
INTRAVENOUS | Status: DC
  Filled 2020-06-03 (×2): qty 250

## 2020-06-03 MED ORDER — VANCOMYCIN HCL 50 MG/ML PO SOLR
125.0000 mg | Freq: Once | ORAL | Status: AC
  Administered 2020-06-03: 125 mg via ORAL
  Filled 2020-06-03: qty 2.5

## 2020-06-03 MED ORDER — MORPHINE SULFATE (PF) 2 MG/ML IV SOLN
4.0000 mg | Freq: Once | INTRAVENOUS | Status: AC
  Administered 2020-06-03: 4 mg via INTRAVENOUS
  Filled 2020-06-03: qty 2

## 2020-06-03 MED ORDER — IOHEXOL 300 MG/ML  SOLN
100.0000 mL | Freq: Once | INTRAMUSCULAR | Status: AC | PRN
  Administered 2020-06-03: 100 mL via INTRAVENOUS

## 2020-06-03 MED ORDER — HEPARIN SOD (PORK) LOCK FLUSH 100 UNIT/ML IV SOLN
500.0000 [IU] | Freq: Once | INTRAVENOUS | Status: DC
  Filled 2020-06-03: qty 5

## 2020-06-03 NOTE — Patient Instructions (Signed)
Infeccin por Clostridioides difficile Clostridioides Difficile Infection La infeccin por Clostridioides difficile, o C. diff., es una infeccin causada por microbios C. diff. (bacterias). Esta infeccin puede ocurrir despus de tomar antibiticos que matan otros microbios y permiten que crezcan los microbios C. diff. El C. diff. puede diseminarse de Ardelia Mems persona a otra (es contagioso). Cules son las causas?  Tomar ciertos antibiticos.  Entrar en contacto con personas, alimentos o cosas que tengan C. diff. Qu incrementa el riesgo?  Tomar ciertos antibiticos durante un [employer].  Estar hospitalizado o estar por un tiempo prolongado en un centro de cuidados a Barrister's clerk.  Ser mayor de [id number].  Haber tenido C. diff. antes o haber estado expuesto a C. diff.  Tener el sistema que combate las enfermedades (sistema inmunitario) debilitado.  Tomar medicamentos que tratan el cido del [place].  Tener problemas graves de [place], como: ? Cncer de colon. ? Enfermedad inflamatoria del intestino (EII).  Haberse sometido a un procedimiento o ciruga del aparato digestivo. Cules son los signos o sntomas?  Materia fecal lquida (diarrea).  Cristy Hilts.  Falta de apetito.  Ganas de vomitar.  Hinchazn, dolor, clicos o dolor de barriga. Cmo se trata? El tratamiento puede incluir:  Dejar de tomar los antibiticos que causaron la infeccin por C. diff.  Tomar antibiticos que matan al C. diff.  Ponerle en el colon materia fecal donada de una persona sana (trasplante fecal).  Realizarse una ciruga para retirar la seccin infectada del colon. Siga estas instrucciones en su casa: Medicamentos  Use los medicamentos de venta libre y los recetados solamente como se lo haya indicado el mdico.  Tome el medicamento antibitico segn le haya indicado el mdico. No deje de tomarlo aunque comience a sentirse mejor.  No tome medicamentos para tratar la materia fecal acuosa a  menos que el mdico se lo indique. Comida y bebida  Siga las instrucciones del mdico sobre qu comer y beber. Esto puede incluir comer alimentos suaves en pequeas cantidades, como: ? Bananas. ? Pur de [employer]. ? Arroz. ? [employer]. ? Tostadas. ? Galletas.  Para evitar la prdida de lquidos del organismo (deshidratacin): ? Beber suficientes lquidos como para mantener el pis de color amarillo plido. Estos incluyen agua, cubos de hielo, jugo de fruta transparente con agua agregada y bebidas deportivas bajas caloras. ? Tomar una SRO (solucin de rehidratacin oral). Esta bebida se vende en farmacias y tiendas minoristas.  [place], la cafena y el alcohol.   Instrucciones generales  Lvese las manos frecuentemente con agua y [place]. Hgalo durante 20 segundos como mnimo.  Tome un bao o una ducha [employer].  Retome sus actividades normales cuando el mdico le diga que es seguro.  Cumpla con todas las visitas de seguimiento. Cmo se previene? Higiene personal  [place] frecuentemente con agua y [place]. Hgalo durante 20 segundos como mnimo.  Lvese las manos antes de preparar comida y despus de ir al bao.  Otras personas tambin deben [employer], especialmente: ? Las personas que viven con usted. ? Las [employer] lo visitan en un hospital o clnica.   Precauciones para el contagio por contacto  Si hace materia fecal acuosa mientras se encuentra en el hospital o en un centro de atencin a largo plazo, dgaselo al mdico de inmediato.  Cuando visite a alguien [facility] o un centro de atencin a largo plazo, use bata, guantes u otros medios de proteccin.  Si es posible: ? Mantngase alejado  de las personas que tienen diarrea. ? Si est enfermo y vive con otras personas, use un bao aparte. Ambiente limpio  Mantenga su casa limpia. ? Limpie su casa todos los das durante al menos una semana despus de salir del  hospital.  Limpie todos los das las superficies que toca. Use un producto que contenga blanqueador con cloro al 10%. Asegrese de lo siguiente: ? Lea la etiqueta del producto para asegurarse de que mate los microbios de las superficies. ? Limpie los inodoros y las manijas de descarga, [place], [place], pomos y manijas de [place], encimeras y [place].  Si se encuentra en el hospital, asegrese de que le limpien las superficies de su habitacin todos [place]. Informe de inmediato a alguien si se derraman o salpican lquidos corporales. [place] y [place] ropa y la ropa de cama con jabn para lavar la ropa que contenga blanqueador con Financial controller. Asegrese de lo siguiente: ? Use jabn en polvo en lugar de lquido. ? Limpie su lavadora una vez al mes. Para ello, ponga el ajuste de calor y encienda el aparato solo con [place]. Comunquese con un mdico si:  Los sntomas no mejoran o empeoran.  Los sntomas desaparecen y Teacher, adult education.  Tiene fiebre.  Aparecen nuevos sntomas. Solicite ayuda de inmediato si:  Tiene un aumento de la sensibilidad o del dolor en la barriga.  Hace heces que en su mayora son sanguinolentas.  Su materia fecal se ve negra.  Vomita despus de comer o beber.  Presenta signos de no tener suficiente lquido en el cuerpo. Estos incluyen: ? Pis amarillo oscuro, pis muy escaso o nada de pis. ? Labios agrietados o Web designer. ? Ausencia de lgrimas cuando llora. ? Ojos hundidos. ? Sensacin de somnolencia. ? Sensacin de estar dbil o mareado. Resumen  La infeccin por C. diff. es una infeccin que puede ocurrir despus de tomar antibiticos.  Los sntomas incluyen materia fecal acuosa, fiebre, falta de [place] o ganas de vomitar.  El tratamiento incluye dejar de tomar los antibiticos que lo enfermaron y tomar antibiticos que matan a los microbios C. diff.  Tambin Tour manager materia fecal de una persona sana en el  colon.  Para evitar la propagacin de la infeccin por C. diff.,lvese las manos con frecuencia con agua y [place]. Hgalo durante 20 segundos como mnimo. Mantenga su casa limpia. Esta informacin no tiene Marine scientist el consejo del mdico. Asegrese de hacerle al mdico cualquier pregunta que tenga. Document Revised: 08/21/2019 Document Reviewed: 08/21/2019 Elsevier Patient Education  [place].

## 2020-06-03 NOTE — Telephone Encounter (Signed)
Lb called results from stool: positive for C diff and E coli

## 2020-06-03 NOTE — Progress Notes (Signed)
FYI

## 2020-06-03 NOTE — Progress Notes (Signed)
Symptom Management [place]  [phone] [phone]  Patient Care Team: Center, Martin, NP as PCP - General Rico Junker, RN as Registered Nurse Theodore Demark, RN as Registered Nurse Sindy Guadeloupe, MD as Consulting Physician (Hematology and Oncology)   Name of the patient: Sophia [name]  [id number]  10/10/1980   Date of visit: 06/03/20  Diagnosis-metastatic breast cancer  Chief complaint/ Reason for visit-diarrhea  Heme/Onc history:  Oncology History  Metastatic breast cancer ([place])  08/29/2019 Initial Diagnosis   Metastatic breast cancer ([place])   09/04/2019 -  Chemotherapy    Patient is on Treatment Plan: BREAST WEEKLY PACLITAXEL + TRASTUZUMAB + PERTUZUMAB Q21D       Genetic Testing   Negative genetic testing. No pathogenic variants identified on the [facility] CancerNext-Expanded+RNA panel. The report date is 04/13/2020.   The CancerNext-Expanded + RNAinsight gene panel offered by [employer] and includes sequencing and rearrangement analysis for the following 77 genes: IP, ALK, APC*, ATM*, AXIN2, BAP1, BARD1, BLM, BMPR1A, BRCA1*, BRCA2*, BRIP1*, CDC73, CDH1*,CDK4, CDKN1B, CDKN2A, CHEK2*, CTNNA1, DICER1, FANCC, FH, FLCN, GALNT12, KIF1B, LZTR1, MAX, MEN1, MET, MLH1*, MSH2*, MSH3, MSH6*, MUTYH*, NBN, NF1*, NF2, NTHL1, PALB2*, PHOX2B, PMS2*, POT1, PRKAR1A, PTCH1, PTEN*, RAD51C*, RAD51D*,RB1, RECQL, RET, SDHA, SDHAF2, SDHB, SDHC, SDHD, SMAD4, SMARCA4, SMARCB1, SMARCE1, STK11, SUFU, TMEM127, TP53*,TSC1, TSC2, VHL and XRCC2 (sequencing and deletion/duplication); EGFR, EGLN1, HOXB13, KIT, MITF, PDGFRA, POLD1 and POLE (sequencing only); EPCAM and GREM1 (deletion/duplication only). DNA and RNA analyses performed for * genes.     Interval history-patient is 40 year old female diagnosed with metastatic breast cancer currently undergoing palliative chemotherapy who presents to symptom management clinic for  complaints of diarrhea and abdominal pain.  Symptoms started on Monday and have progressively worsened since that time.  Reports watery diarrhea multiple times a day.  No longer able to tolerate food or fluids due to feelings that it exacerbates her diarrhea.  Is weak and tired.  No fevers or chills.  Denies urinary symptoms.  No dizziness, weakness, or falls.  Denies sick contacts.  Rates abdominal pain 10 of 10.  Has not been taking her oral pain medication due to diarrhea.  Review of systems- Review of Systems  Constitutional: Positive for malaise/fatigue and weight loss. Negative for chills and fever.  HENT: Negative for hearing loss, nosebleeds, sore throat and tinnitus.   Eyes: Negative for blurred vision and double vision.  Respiratory: Negative for cough, hemoptysis, shortness of breath and wheezing.   Cardiovascular: Negative for chest pain, palpitations and leg swelling.  Gastrointestinal: Positive for abdominal pain, diarrhea, nausea and vomiting. Negative for blood in stool, constipation and melena.  Genitourinary: Negative for dysuria and urgency.  Musculoskeletal: Negative for back pain, falls, joint pain and myalgias.  Skin: Negative for itching and rash.  Neurological: Positive for weakness. Negative for dizziness, tingling, sensory change, loss of consciousness and headaches.  Endo/Heme/Allergies: Negative for environmental allergies. Does not bruise/bleed easily.  Psychiatric/Behavioral: Negative for depression. The patient is not nervous/anxious and does not have insomnia.     No Known Allergies  Past Medical History:  Diagnosis Date  . Anxiety   . Breast cancer ([place])    with mets  . Cancer ([place])   . Colitis   . Family history of colon cancer   . Vertigo     Past Surgical History:  Procedure Laterality Date  . BREAST BIOPSY Left 08/14/2019   [place] bx of mass, coil marker, path pending  .  BREAST BIOPSY Left 08/14/2019   [place] bx of LN, hydromarker, path pending  .  BREAST BIOPSY Left 08/14/2019   affirm bx of calcs, x marker, path pending  . ESOPHAGOGASTRODUODENOSCOPY (EGD) WITH PROPOFOL N/A 10/05/2019   Procedure: ESOPHAGOGASTRODUODENOSCOPY (EGD) WITH PROPOFOL;  Surgeon: Lin Landsman, MD;  Location: [place] [facility];  Service: Gastroenterology;  Laterality: N/A;  . FLEXIBLE SIGMOIDOSCOPY N/A 10/05/2019   Procedure: FLEXIBLE SIGMOIDOSCOPY;  Surgeon: Lin Landsman, MD;  Location: [facility];  Service: Gastroenterology;  Laterality: N/A;  . INTRAMEDULLARY (IM) NAIL INTERTROCHANTERIC Right 09/01/2019   Procedure: INTRAMEDULLARY (IM) NAIL INTERTROCHANTRIC AND RADIOFREQUENCY ABLATION;  Surgeon: Hessie Knows, MD;  Location: [facility] ORS;  Service: Orthopedics;  Laterality: Right;  . KYPHOPLASTY N/A 08/29/2019   Procedure: KYPHOPLASTY T6, L1,L4 ,  RADIOFREQUENCY ABLATION;  Surgeon: Hessie Knows, MD;  Location: [facility] OR[facility];  Service: Orthopedics;  Laterality: N/A;  . KYPHOPLASTY Right 09/01/2019   Procedure: Right Sacral Radiofrequency Ablation and Cement Augmentation, Right sacrum and iliac crest;  Surgeon: Hessie Knows, MD;  Location: [facility];  Service: Orthopedics;  Laterality: Right;  . PORTA CATH INSERTION N/A 08/28/2019   Procedure: PORTA CATH INSERTION;  Surgeon: Algernon Huxley, MD;  Location: [place] [facility];  Service: Cardiovascular;  Laterality: N/A;    Social History   Socioeconomic History  . Marital status: Single    Spouse name: Not on file  . Number of children: Not on file  . Years of education: Not on file  . Highest education level: Not on file  Occupational History  . Not on file  Tobacco Use  . Smoking status: Never Smoker  . Smokeless tobacco: Never Used  Vaping Use  . Vaping Use: Never used  Substance and Sexual Activity  . Alcohol use: Not Currently  . Drug use: Not Currently  . Sexual activity: Not Currently    Birth control/protection: None  Other Topics Concern  . Not on file  Social History Narrative   Lives at  home with children   Social Determinants of Health   Financial Resource Strain: Not on file  Food Insecurity: Not on file  Transportation Needs: Not on file  Physical Activity: Not on file  Stress: Not on file  Social Connections: Not on file  Intimate Partner Violence: Not on file    Family History  Problem Relation Age of Onset  . Colon cancer Maternal Uncle      Current Outpatient Medications:  .  clindamycin (CLINDAGEL) 1 % gel, Apply topically 2 (two) times daily. X 7 days., Disp: 30 g, Rfl: 0 .  diphenoxylate-atropine (LOMOTIL) 2.5-0.025 MG tablet, Take 1 tablet by mouth 4 (four) times daily as needed for diarrhea or loose stools., Disp: 40 tablet, Rfl: 0 .  FLUoxetine (PROZAC) 10 MG capsule, Take 1 capsule (10 mg total) by mouth daily., Disp: 30 capsule, Rfl: 3 .  gabapentin (NEURONTIN) 300 MG capsule, Take 1 capsule (300 mg total) by mouth 3 (three) times daily., Disp: 180 capsule, Rfl: 0 .  levofloxacin (LEVAQUIN) 500 MG tablet, Take 1 tablet (500 mg total) by mouth daily., Disp: 7 tablet, Rfl: 0 .  lidocaine-prilocaine (EMLA) cream, Apply 1 application topically as needed. Apply small amount to port site at least 1 hour prior to it being accessed, cover with plastic wrap, Disp: 30 g, Rfl: 1 .  oxycodone (OXY-IR) 5 MG capsule, Take 1-2 capsules (5-10 mg total) by mouth every 4 (four) hours as needed., Disp: 60 capsule, Rfl: 0 .  oxyCODONE (  OXYCONTIN) 10 mg 12 hr tablet, Take 1 tablet (10 mg total) by mouth every 12 (twelve) hours., Disp: 60 tablet, Rfl: 0 .  potassium chloride SA (KLOR-CON) 20 MEQ tablet, Take 1 tablet (20 mEq total) by mouth daily., Disp: 30 tablet, Rfl: 0 No current facility-administered medications for this visit.  Facility-Administered Medications Ordered in Other Visits:  .  0.9 %  sodium chloride infusion, , Intravenous, Continuous, Burns, Wandra Feinstein, NP, Stopped at 06/02/20 1421 .  0.9 %  sodium chloride infusion, , Intravenous, Continuous, Burns,  Wandra Feinstein, NP, Stopped at 06/02/20 1453 .  0.9 %  sodium chloride infusion, , Intravenous, Continuous, Verlon Au, NP, Last Rate: 999 mL/hr at 06/03/20 1159, New Bag at 06/03/20 1159  Physical exam:  Vitals:   06/03/20 1153  BP: 111/81  Pulse: (!) 103  Resp: 17  Temp: (!) 100.5 F (38.1 C)  TempSrc: Tympanic  SpO2: 100%   Physical Exam Constitutional:      General: She is not in acute distress.    Appearance: She is well-developed. She is ill-appearing.  HENT:     Head: Atraumatic.     Mouth/Throat:     Mouth: Mucous membranes are dry.     Pharynx: No oropharyngeal exudate.  Eyes:     General: No scleral icterus.    Conjunctiva/sclera: Conjunctivae normal.  Cardiovascular:     Rate and Rhythm: Normal rate and regular rhythm.  Pulmonary:     Effort: Pulmonary effort is normal.     Breath sounds: Normal breath sounds.  Abdominal:     General: Bowel sounds are normal. There is no distension.     Palpations: Abdomen is soft.     Tenderness: There is abdominal tenderness (Diffuse tenderness across the quadrants). There is no right CVA tenderness, left CVA tenderness, guarding or rebound.  Musculoskeletal:        General: No deformity. Normal range of motion.     Cervical back: Normal range of motion and neck supple.  Skin:    General: Skin is warm and dry.  Neurological:     General: No focal deficit present.     Mental Status: She is oriented to person, place, and time.  Psychiatric:        Mood and Affect: Mood normal.        Behavior: Behavior normal.      CMP Latest Ref Rng & Units 06/03/2020  Glucose 70 - 99 mg/dL 98  BUN 6 - 20 mg/dL 7  Creatinine 0.44 - 1.00 mg/dL 0.52  Sodium 135 - 145 mmol/L 135  Potassium 3.5 - 5.1 mmol/L 3.3(L)  Chloride 98 - 111 mmol/L 105  CO2 22 - 32 mmol/L 21(L)  Calcium 8.9 - 10.3 mg/dL 8.1(L)  Total Protein 6.5 - 8.1 g/dL 6.6  Total Bilirubin 0.3 - 1.2 mg/dL 1.4(H)  Alkaline Phos 38 - 126 U/L 67  AST 15 - 41 U/L 23  ALT 0  - 44 U/L 23   CBC Latest Ref Rng & Units 06/03/2020  WBC 4.0 - 10.5 K/uL 12.6(H)  Hemoglobin 12.0 - 15.0 g/dL 10.7(L)  Hematocrit 36.0 - 46.0 % 33.5(L)  Platelets 150 - 400 K/uL 426(H)    No images are attached to the encounter.  DG Chest 2 View  Result Date: 06/02/2020 CLINICAL DATA:  Fever.  History of metastatic breast carcinoma EXAM: CHEST - 2 VIEW COMPARISON:  October 01, 2019 chest radiograph; chest CT April 01, 2020 FINDINGS: There is slight scarring in  the medial left upper lobe. The lungs elsewhere are clear. Heart size and pulmonary vascular normal. No adenopathy. Port-A-Cath tip at cavoatrial junction. No pneumothorax. Patient has had previous kyphoplasty procedures at T7, L2, and L5. No blastic or lytic bone lesions evident by radiography. IMPRESSION: Mild scarring left upper lobe. Lungs otherwise clear. Heart size normal. No adenopathy. Previous kyphoplasty procedures at several levels. Electronically Signed   By: Lowella Grip III M.D.   On: 06/02/2020 11:23   CT Abdomen Pelvis W Contrast  Result Date: 06/03/2020 CLINICAL DATA:  Concern for gastroenteritis. Abdominal pain. History of breast cancer. Chemotherapy ongoing. EXAM: CT ABDOMEN AND PELVIS WITH CONTRAST TECHNIQUE: Multidetector CT imaging of the abdomen and pelvis was performed using the standard protocol following bolus administration of intravenous contrast. CONTRAST:  120m OMNIPAQUE IOHEXOL 300 MG/ML  SOLN COMPARISON:  CT 04/01/2020 FINDINGS: Lower chest: Lung bases are clear. Hepatobiliary: No focal hepatic lesion. No biliary duct dilatation. Common bile duct is normal. Pancreas: Pancreas is normal. No ductal dilatation. No pancreatic inflammation. Spleen: Normal spleen Adrenals/urinary tract: Adrenal glands and kidneys are normal. The ureters and bladder normal. Stomach/Bowel: Stomach, duodenum small-bowel normal. No evidence of small bowel inflammation or obstruction. Terminal ileum is normal. Appendix is normal. The  entire colon is collapsed. Sigmoid colon and rectum are collapsed. No clear evidence of colonic inflammation. No mesenteric inflammation. Vascular/Lymphatic: Abdominal aorta is normal caliber. No periportal or retroperitoneal adenopathy. No pelvic adenopathy. Reproductive: Uterus and adnexa unremarkable. Other: No free fluid.  No peritoneal disease Musculoskeletal: No aggressive osseous lesion. Chronic compression fracture of the lumbar spine with augmentation. Round sclerotic lesions at T12 and T10 are not changed from comparison CT. Additional smaller lesions in the lumbar spine. The lesions are not hypermetabolic on comparison PET-CT scan 12/16/2019. Sacral plasty in the RIGHT sacral ala. IMPRESSION: 1. No evidence of inflammation or obstruction the small bowel or colon. 2. Entire colon is collapsed without stool. 3. Gallbladder appendix are normal. 4. Round sclerotic skeletal lesions again noted. No interval change. Electronically Signed   By: SSuzy BouchardM.D.   On: 06/03/2020 11:30    Assessment and plan- Patient is a 40y.o. female diagnosed with metastatic breast cancer currently undergoing palliative chemotherapy who presents to symptom management clinic for complaints of diarrhea.  History of colitis. 1.  Diarrhea-CT abdomen pelvis shows no evidence of inflammation in the bowel or colitis.  No acute etiology.  Recommend stool studies to evaluate for infectious etiology.  2.  C. difficile infection- c. Diff positive for acute infection.  Initial episode.  Absence of colitis on CT however WBC > 15000 yesterday. Improving today at 12.6.  Discussed with Dr. RJanese Bankswho consulted GI to discuss treatment options including fidaxomicin vs oral vancomycin but due to financial barriers recommend vancomycin. If symptoms do not improve and wbc doesn't trend down then would consider hospitalization. Recommend oral vancomycin 125 mg q6h. Hold antidiarrheal agents for risk of megacolon. Plan to treat for 10 days  though may need t extend given secondary E coli infection. No recommendation for follow up stool studies.   3. E. Coli- EPEC. No colitis on imaging so hold antibiotics at this time.   Plan for supportive care in clinic, IV pain medication, nausea medication, and IV fluids throughout the day. Social work consulted to assist patient with medication and transportation. Return to clinic tomorrow for continuation of treatment and supportive care.    Visit Diagnosis 1. C. difficile diarrhea   2. E coli infection  Patient expressed understanding and was in agreement with this plan. She also understands that She can call clinic at any time with any questions, concerns, or complaints.   Thank you for allowing me to participate in the care of this very pleasant patient.   Due to language barrier, a spanish language interpreter was utilized for all patient interactions.   I spent a total of 60 minutes reviewing chart data, face-to-face evaluation with the patient, counseling and coordination of care as detailed above.  Beckey Rutter, DNP, AGNP-C [facility] [place]

## 2020-06-03 NOTE — Progress Notes (Signed)
Rapid covid test performed at [facility] was negative.

## 2020-06-04 ENCOUNTER — Inpatient Hospital Stay: Payer: Self-pay

## 2020-06-04 ENCOUNTER — Other Ambulatory Visit: Payer: Self-pay

## 2020-06-04 ENCOUNTER — Inpatient Hospital Stay (HOSPITAL_BASED_OUTPATIENT_CLINIC_OR_DEPARTMENT_OTHER): Payer: Self-pay | Admitting: Nurse Practitioner

## 2020-06-04 VITALS — BP 109/73 | HR 85 | Temp 98.8°F | Resp 16

## 2020-06-04 VITALS — BP 103/66 | HR 78 | Temp 98.6°F | Resp 18 | Wt 134.0 lb

## 2020-06-04 DIAGNOSIS — E876 Hypokalemia: Secondary | ICD-10-CM

## 2020-06-04 DIAGNOSIS — A0472 Enterocolitis due to Clostridium difficile, not specified as recurrent: Secondary | ICD-10-CM

## 2020-06-04 DIAGNOSIS — A498 Other bacterial infections of unspecified site: Secondary | ICD-10-CM

## 2020-06-04 DIAGNOSIS — C50919 Malignant neoplasm of unspecified site of unspecified female breast: Secondary | ICD-10-CM

## 2020-06-04 DIAGNOSIS — R1030 Lower abdominal pain, unspecified: Secondary | ICD-10-CM

## 2020-06-04 LAB — CBC WITH DIFFERENTIAL/PLATELET
Abs Immature Granulocytes: 0.46 10*3/uL — ABNORMAL HIGH (ref 0.00–0.07)
Basophils Absolute: 0.1 10*3/uL (ref 0.0–0.1)
Basophils Relative: 1 %
Eosinophils Absolute: 0.2 10*3/uL (ref 0.0–0.5)
Eosinophils Relative: 2 %
HCT: 30.4 % — ABNORMAL LOW (ref 36.0–46.0)
Hemoglobin: 9.8 g/dL — ABNORMAL LOW (ref 12.0–15.0)
Immature Granulocytes: 5 %
Lymphocytes Relative: 12 %
Lymphs Abs: 1.1 10*3/uL (ref 0.7–4.0)
MCH: 27.4 pg (ref 26.0–34.0)
MCHC: 32.2 g/dL (ref 30.0–36.0)
MCV: 84.9 fL (ref 80.0–100.0)
Monocytes Absolute: 0.7 10*3/uL (ref 0.1–1.0)
Monocytes Relative: 7 %
Neutro Abs: 6.6 10*3/uL (ref 1.7–7.7)
Neutrophils Relative %: 73 %
Platelets: 415 10*3/uL — ABNORMAL HIGH (ref 150–400)
RBC: 3.58 MIL/uL — ABNORMAL LOW (ref 3.87–5.11)
RDW: 17.2 % — ABNORMAL HIGH (ref 11.5–15.5)
Smear Review: NORMAL
WBC: 9.1 10*3/uL (ref 4.0–10.5)
nRBC: 0 % (ref 0.0–0.2)

## 2020-06-04 LAB — COMPREHENSIVE METABOLIC PANEL
ALT: 19 U/L (ref 0–44)
AST: 21 U/L (ref 15–41)
Albumin: 3.1 g/dL — ABNORMAL LOW (ref 3.5–5.0)
Alkaline Phosphatase: 56 U/L (ref 38–126)
Anion gap: 7 (ref 5–15)
BUN: 10 mg/dL (ref 6–20)
CO2: 22 mmol/L (ref 22–32)
Calcium: 7.8 mg/dL — ABNORMAL LOW (ref 8.9–10.3)
Chloride: 105 mmol/L (ref 98–111)
Creatinine, Ser: 0.58 mg/dL (ref 0.44–1.00)
GFR, Estimated: >60 mL/min (ref >60)
Glucose, Bld: 131 mg/dL — ABNORMAL HIGH (ref 70–99)
Potassium: 2.7 mmol/L — CL (ref 3.5–5.1)
Sodium: 134 mmol/L — ABNORMAL LOW (ref 135–145)
Total Bilirubin: 0.7 mg/dL (ref 0.3–1.2)
Total Protein: 6.3 g/dL — ABNORMAL LOW (ref 6.5–8.1)

## 2020-06-04 MED ORDER — SODIUM CHLORIDE 0.9 % IV SOLN
INTRAVENOUS | Status: AC
  Filled 2020-06-04 (×2): qty 250

## 2020-06-04 MED ORDER — POTASSIUM CHLORIDE IN NACL 40-0.9 MEQ/L-% IV SOLN
INTRAVENOUS | Status: DC
  Filled 2020-06-04 (×2): qty 1000

## 2020-06-04 MED ORDER — MORPHINE SULFATE (PF) 2 MG/ML IV SOLN: 4.0000 mg | Freq: Once | INTRAVENOUS | Status: DC

## 2020-06-04 MED ORDER — SODIUM CHLORIDE 0.9% FLUSH
10.0000 mL | Freq: Once | INTRAVENOUS | Status: AC
  Administered 2020-06-04: 10 mL via INTRAVENOUS
  Filled 2020-06-04: qty 10

## 2020-06-04 MED ORDER — ONDANSETRON HCL 4 MG/2ML IJ SOLN: 8.0000 mg | Freq: Once | INTRAMUSCULAR | Status: DC

## 2020-06-04 MED ORDER — ONDANSETRON HCL 4 MG/2ML IJ SOLN
8.0000 mg | Freq: Once | INTRAMUSCULAR | Status: AC
  Administered 2020-06-04: 8 mg via INTRAVENOUS
  Filled 2020-06-04: qty 4

## 2020-06-04 MED ORDER — MORPHINE SULFATE (PF) 2 MG/ML IV SOLN
4.0000 mg | Freq: Once | INTRAVENOUS | Status: AC
  Administered 2020-06-04: 4 mg via INTRAVENOUS
  Filled 2020-06-04: qty 2

## 2020-06-04 MED ORDER — HEPARIN SOD (PORK) LOCK FLUSH 100 UNIT/ML IV SOLN
500.0000 [IU] | Freq: Once | INTRAVENOUS | Status: AC
  Administered 2020-06-04: 500 [IU] via INTRAVENOUS
  Filled 2020-06-04: qty 5

## 2020-06-04 NOTE — Progress Notes (Signed)
[facility]  [phone] [phone]  Patient Care Team: [facility], [place], NP as PCP - General Rico Junker, RN as Registered Nurse Theodore Demark, RN as Registered Nurse Sindy Guadeloupe, MD as Consulting Physician (Hematology and Oncology)   Name of the patient: Sophia [name]  [id number]  April 07, 1980   Date of visit: 06/04/20  Diagnosis- Metastatic breast cancer  Chief complaint/ Reason for visit- c diff and e coli diarrhea  Heme/Onc history:  Oncology History  Metastatic breast cancer ([place])  08/29/2019 Initial Diagnosis   Metastatic breast cancer ([place])   09/04/2019 -  Chemotherapy    Patient is on Treatment Plan: BREAST WEEKLY PACLITAXEL + TRASTUZUMAB + PERTUZUMAB Q21D       Genetic Testing   Negative genetic testing. No pathogenic variants identified on the [facility] CancerNext-Expanded+RNA panel. The report date is 04/13/2020.   The CancerNext-Expanded + RNAinsight gene panel offered by [employer] and includes sequencing and rearrangement analysis for the following 77 genes: IP, ALK, APC*, ATM*, AXIN2, BAP1, BARD1, BLM, BMPR1A, BRCA1*, BRCA2*, BRIP1*, CDC73, CDH1*,CDK4, CDKN1B, CDKN2A, CHEK2*, CTNNA1, DICER1, FANCC, FH, FLCN, GALNT12, KIF1B, LZTR1, MAX, MEN1, MET, MLH1*, MSH2*, MSH3, MSH6*, MUTYH*, NBN, NF1*, NF2, NTHL1, PALB2*, PHOX2B, PMS2*, POT1, PRKAR1A, PTCH1, PTEN*, RAD51C*, RAD51D*,RB1, RECQL, RET, SDHA, SDHAF2, SDHB, SDHC, SDHD, SMAD4, SMARCA4, SMARCB1, SMARCE1, STK11, SUFU, TMEM127, TP53*,TSC1, TSC2, VHL and XRCC2 (sequencing and deletion/duplication); EGFR, EGLN1, HOXB13, KIT, MITF, PDGFRA, POLD1 and POLE (sequencing only); EPCAM and GREM1 (deletion/duplication only). DNA and RNA analyses performed for * genes.     Interval history-patient is 40 year old female with above history of metastatic breast cancer who presents to symptom management clinic for follow-up for recently  diagnosed C. difficile and E. coli diarrhea.  She was able to tolerate oral vancomycin overnight but continues to have diarrhea and abdominal cramping.  [place] [address] when trying to stand when rising from toilet due to numbness of her legs. Now complains of pain in left leg where she has history of fracture and hardware placement. She believes numbness was from sitting on toilet and it has since resolved. She continues to have abdominal pain but slightly improved. Rates 9/10. One episode of dairrhea overnight, lasted 1+ hour. Appetite is minimal. Drinking sips at most. Fearful of pain and diarrhea. No fevers, chills. No nausea, vomiting. No urinary complaints.   Review of systems- Review of Systems  Constitutional: Positive for malaise/fatigue and weight loss. Negative for chills and fever.  HENT: Negative for hearing loss, nosebleeds, sore throat and tinnitus.   Eyes: Negative for blurred vision and double vision.  Respiratory: Negative for cough, hemoptysis, shortness of breath and wheezing.   Cardiovascular: Negative for chest pain, palpitations and leg swelling.  Gastrointestinal: Positive for abdominal pain, diarrhea and nausea. Negative for blood in stool, constipation, melena and vomiting.  Genitourinary: Negative for dysuria and urgency.  Musculoskeletal: Positive for falls (per hpi) and joint pain (right upper leg pain- aching. ). Negative for back pain and myalgias.  Skin: Negative for itching and rash.  Neurological: Positive for weakness. Negative for dizziness, tingling, sensory change, loss of consciousness and headaches.  Endo/Heme/Allergies: Negative for environmental allergies. Does not bruise/bleed easily.  Psychiatric/Behavioral: Negative for depression. The patient is not nervous/anxious and does not have insomnia.      No Known Allergies  Past Medical History:  Diagnosis Date  . Anxiety   . Breast cancer ([place])    with mets  . Cancer ([place])   .  Colitis   . Family history of colon  cancer   . Vertigo     Past Surgical History:  Procedure Laterality Date  . BREAST BIOPSY Left 08/14/2019   [place] bx of mass, coil marker, path pending  . BREAST BIOPSY Left 08/14/2019   [place] bx of LN, hydromarker, path pending  . BREAST BIOPSY Left 08/14/2019   affirm bx of calcs, x marker, path pending  . ESOPHAGOGASTRODUODENOSCOPY (EGD) WITH PROPOFOL N/A 10/05/2019   Procedure: ESOPHAGOGASTRODUODENOSCOPY (EGD) WITH PROPOFOL;  Surgeon: Lin Landsman, MD;  Location: [place];  Service: Gastroenterology;  Laterality: N/A;  . FLEXIBLE SIGMOIDOSCOPY N/A 10/05/2019   Procedure: FLEXIBLE SIGMOIDOSCOPY;  Surgeon: Lin Landsman, MD;  [facility] ENDOSCOPY;  Service: Gastroenterology;  Laterality: N/A;  . INTRAMEDULLARY (IM) NAIL INTERTROCHANTERIC Right 09/01/2019   Procedure: INTRAMEDULLARY (IM) NAIL INTERTROCHANTRIC AND RADIOFREQUENCY ABLATION;  Surgeon: Hessie Knows, MD;  Location: [facility] ORS;  Service: Orthopedics;  Laterality: Right;  . KYPHOPLASTY N/A 08/29/2019   Procedure: KYPHOPLASTY T6, L1,L4 ,  RADIOFREQUENCY ABLATION;  Surgeon: Hessie Knows, MD;  Location: [facility] ORS;  Service: Orthopedics;  Laterality: N/A;  . KYPHOPLASTY Right 09/01/2019   Procedure: Right Sacral Radiofrequency Ablation and Cement Augmentation, Right sacrum and iliac crest;  Surgeon: Hessie Knows, MD;  Location: [facility] OR[facility];  Service: Orthopedics;  Laterality: Right;  . PORTA CATH INSERTION N/A 08/28/2019   Procedure: PORTA CATH INSERTION;  Surgeon: Algernon Huxley, MD;  Location: [place] [facility];  Service: Cardiovascular;  Laterality: N/A;    Social History   Socioeconomic History  . Marital status: Single    Spouse name: Not on file  . Number of children: Not on file  . Years of education: Not on file  . Highest education level: Not on file  Occupational History  . Not on file  Tobacco Use  . Smoking status: Never Smoker  . Smokeless tobacco: Never Used  Vaping Use  . Vaping Use: Never used   Substance and Sexual Activity  . Alcohol use: Not Currently  . Drug use: Not Currently  . Sexual activity: Not Currently    Birth control/protection: None  Other Topics Concern  . Not on file  Social History Narrative   Lives at home with children   Social Determinants of Health   Financial Resource Strain: Not on file  Food Insecurity: Not on file  Transportation Needs: Not on file  Physical Activity: Not on file  Stress: Not on file  Social Connections: Not on file  Intimate Partner Violence: Not on file    Family History  Problem Relation Age of Onset  . Colon cancer Maternal Uncle      Current Outpatient Medications:  .  clindamycin (CLINDAGEL) 1 % gel, Apply topically 2 (two) times daily. X 7 days., Disp: 30 g, Rfl: 0 .  diphenoxylate-atropine (LOMOTIL) 2.5-0.025 MG tablet, Take 1 tablet by mouth 4 (four) times daily as needed for diarrhea or loose stools., Disp: 40 tablet, Rfl: 0 .  FLUoxetine (PROZAC) 10 MG capsule, Take 1 capsule (10 mg total) by mouth daily., Disp: 30 capsule, Rfl: 3 .  gabapentin (NEURONTIN) 300 MG capsule, Take 1 capsule (300 mg total) by mouth 3 (three) times daily., Disp: 180 capsule, Rfl: 0 .  levofloxacin (LEVAQUIN) 500 MG tablet, Take 1 tablet (500 mg total) by mouth daily., Disp: 7 tablet, Rfl: 0 .  lidocaine-prilocaine (EMLA) cream, Apply 1 application topically as needed. Apply small amount to port site at least 1 hour prior  to it being accessed, cover with plastic wrap, Disp: 30 g, Rfl: 1 .  oxycodone (OXY-IR) 5 MG capsule, Take 1-2 capsules (5-10 mg total) by mouth every 4 (four) hours as needed., Disp: 60 capsule, Rfl: 0 .  oxyCODONE (OXYCONTIN) 10 mg 12 hr tablet, Take 1 tablet (10 mg total) by mouth every 12 (twelve) hours., Disp: 60 tablet, Rfl: 0 .  potassium chloride SA (KLOR-CON) 20 MEQ tablet, Take 1 tablet (20 mEq total) by mouth daily., Disp: 30 tablet, Rfl: 0 .  vancomycin (VANCOCIN) 125 MG capsule, Take 1 capsule (125 mg total)  by mouth 4 (four) times daily for 10 days. For C Diff infection, Disp: 40 capsule, Rfl: 0 No current facility-administered medications for this visit.  Facility-Administered Medications Ordered in Other Visits:  .  0.9 %  sodium chloride infusion, , Intravenous, Continuous, Burns, Wandra Feinstein, NP, Stopped at 06/02/20 1453  Physical exam:  Vitals:   06/04/20 0958  BP: 103/66  Pulse: 78  Resp: 18  Temp: 98.6 F (37 C)  TempSrc: Tympanic  SpO2: 100%  Weight: 134 lb (60.8 kg)   Physical Exam Constitutional:      General: She is not in acute distress.    Appearance: She is well-developed. She is not toxic-appearing.     Comments: Alertness improved compared to yesterday  HENT:     Head: Atraumatic.     Nose: Nose normal.     Mouth/Throat:     Pharynx: No oropharyngeal exudate.  Eyes:     General: No scleral icterus.    Conjunctiva/sclera: Conjunctivae normal.  Cardiovascular:     Rate and Rhythm: Normal rate and regular rhythm.  Pulmonary:     Effort: Pulmonary effort is normal.     Breath sounds: Normal breath sounds.  Abdominal:     General: Bowel sounds are normal. There is no distension.     Palpations: Abdomen is soft.     Tenderness: There is abdominal tenderness (lower abdomen diffusely tender). There is no right CVA tenderness, left CVA tenderness or guarding.  Musculoskeletal:        General: No swelling, tenderness (RUL nontender to palpation) or signs of injury. Normal range of motion.     Cervical back: Normal range of motion and neck supple.     Right upper leg: No swelling, edema, deformity, tenderness or bony tenderness.     Left upper leg: No swelling, edema, deformity, tenderness or bony tenderness.     Right knee: Normal.     Left knee: Normal.  Skin:    General: Skin is warm and dry.  Neurological:     General: No focal deficit present.     Mental Status: She is oriented to person, place, and time.  Psychiatric:        Mood and Affect: Mood normal.         Behavior: Behavior normal.      CMP Latest Ref Rng & Units 06/04/2020  Glucose 70 - 99 mg/dL 131(H)  BUN 6 - 20 mg/dL 10  Creatinine 0.44 - 1.00 mg/dL 0.58  Sodium 135 - 145 mmol/L 134(L)  Potassium 3.5 - 5.1 mmol/L 2.7(LL)  Chloride 98 - 111 mmol/L 105  CO2 22 - 32 mmol/L 22  Calcium 8.9 - 10.3 mg/dL 7.8(L)  Total Protein 6.5 - 8.1 g/dL 6.3(L)  Total Bilirubin 0.3 - 1.2 mg/dL 0.7  Alkaline Phos 38 - 126 U/L 56  AST 15 - 41 U/L 21  ALT 0 - 44  U/L 19   CBC Latest Ref Rng & Units 06/04/2020  WBC 4.0 - 10.5 K/uL 9.1  Hemoglobin 12.0 - 15.0 g/dL 9.8(L)  Hematocrit 36.0 - 46.0 % 30.4(L)  Platelets 150 - 400 K/uL 415(H)    No images are attached to the encounter.  DG Chest 2 View  Result Date: 06/02/2020 CLINICAL DATA:  Fever.  History of metastatic breast carcinoma EXAM: CHEST - 2 VIEW COMPARISON:  October 01, 2019 chest radiograph; chest CT April 01, 2020 FINDINGS: There is slight scarring in the medial left upper lobe. The lungs elsewhere are clear. Heart size and pulmonary vascular normal. No adenopathy. Port-A-Cath tip at cavoatrial junction. No pneumothorax. Patient has had previous kyphoplasty procedures at T7, L2, and L5. No blastic or lytic bone lesions evident by radiography. IMPRESSION: Mild scarring left upper lobe. Lungs otherwise clear. Heart size normal. No adenopathy. Previous kyphoplasty procedures at several levels. Electronically Signed   By: William  Woodruff III M.D.   On: 06/02/2020 11:23   CT Abdomen Pelvis W Contrast  Result Date: 06/03/2020 CLINICAL DATA:  Concern for gastroenteritis. Abdominal pain. History of breast cancer. Chemotherapy ongoing. EXAM: CT ABDOMEN AND PELVIS WITH CONTRAST TECHNIQUE: Multidetector CT imaging of the abdomen and pelvis was performed using the standard protocol following bolus administration of intravenous contrast. CONTRAST:  100mL OMNIPAQUE IOHEXOL 300 MG/ML  SOLN COMPARISON:  CT 04/01/2020 FINDINGS: Lower chest: Lung bases are  clear. Hepatobiliary: No focal hepatic lesion. No biliary duct dilatation. Common bile duct is normal. Pancreas: Pancreas is normal. No ductal dilatation. No pancreatic inflammation. Spleen: Normal spleen Adrenals/urinary tract: Adrenal glands and kidneys are normal. The ureters and bladder normal. Stomach/Bowel: Stomach, duodenum small-bowel normal. No evidence of small bowel inflammation or obstruction. Terminal ileum is normal. Appendix is normal. The entire colon is collapsed. Sigmoid colon and rectum are collapsed. No clear evidence of colonic inflammation. No mesenteric inflammation. Vascular/Lymphatic: Abdominal aorta is normal caliber. No periportal or retroperitoneal adenopathy. No pelvic adenopathy. Reproductive: Uterus and adnexa unremarkable. Other: No free fluid.  No peritoneal disease Musculoskeletal: No aggressive osseous lesion. Chronic compression fracture of the lumbar spine with augmentation. Round sclerotic lesions at T12 and T10 are not changed from comparison CT. Additional smaller lesions in the lumbar spine. The lesions are not hypermetabolic on comparison PET-CT scan 12/16/2019. Sacral plasty in the RIGHT sacral ala. IMPRESSION: 1. No evidence of inflammation or obstruction the small bowel or colon. 2. Entire colon is collapsed without stool. 3. Gallbladder appendix are normal. 4. Round sclerotic skeletal lesions again noted. No interval change. Electronically Signed   By: Stewart  Edmunds M.D.   On: 06/03/2020 11:30    Assessment and plan- Patient is a 39 y.o. female with metastatic breast cancer currently on chemotherapy who presents to [facility] for follow up for   1. C Difficile Infection- antigen and toxin positive consistent with active acute infection. Currently s/p 2 doses of oral vancomycin. Unable to tolerate orals consistently due to pain and concern for diarrhea. Will give IV pain medication, nausea medication, and have her take her oral vancomycin in clinic  along with fluids. Will plan to keep her in clinic today for supportive care then repeat pre-medication so that she can take next dose of oral vancomycin prior to discharge.   2. Enteropathogenic E. Coli (EPEC)- no antibiotics indicated at this time. Continue treatment of c. Diff co infection.   3. Abdominal pain- secondary to acute infection. Not tolerating oral pain medication well. Discussed that antibiotics   will treat underlying infection that causes her pain. Plan for IV morphine prior to dosing with oral antibiotics. Patient agreeable. Bland diet as tolerated.   4. Hypokalemia- likely secondary to GI losses. K 2.7 today. Will give 40 meq KCL in clinic. Would recommend oral replenishment following but don't believe she'll be able to tolerate. Will plan to recheck labs early next week and if improving oral intake, re-evaluate then.   5. Hypocalcemia- corrected calcium 8.5. Monitor.   Patient kept in clinic for 5+ hours. She was reevaluated multiple times and improved consistently throughout the day. Received 2 doses of oral vancomycin in clinic. Comfortable with discharge and home care over the weekend. ER precautions reviewed in detail if she worsens over the weekend or is unable to tolerate her medications by mouth.   Return to clinic on 06/07/20 for labs and re-evaluation with possible infusion. Patient scheduled to follow up with Dr. Janese Banks on 06/11/20. May need to hold treatment.    Visit Diagnosis 1. C. difficile diarrhea   2. E coli infection   3. Lower abdominal pain   4. Hypokalemia due to excessive gastrointestinal loss of potassium    Patient expressed understanding and was in agreement with this plan. She also understands that She can call clinic at any time with any questions, concerns, or complaints.   Thank you for allowing me to participate in the care of this very pleasant patient.   Due to language barrier, a spanish interpreter was present for and participated in all patient  interactions.   I spent a total of 60 minutes reviewing chart data, face-to-face evaluation with the patient, counseling and coordination of care as detailed above.  Beckey Rutter, DNP, AGNP-C [place] [facility] [place]

## 2020-06-04 NOTE — Progress Notes (Signed)
Critical potassium 2.7 reported to Beckey Rutter, NP at 1130 AM. New order for 25meq KCL in 1L NS. Patient stable at discharge. No fever. Reports small amount of improvement in abdominal pain after morphine given. Took several sips of soup and several sips of ginger ale today and ate 2 saltine crackers. Had one liquid BM while in clinic. Instructed how to take antibiotic 4 times a day and provided with schedule. Verbalized understanding. Will return Monday for f/u. Will be using [facility] for transport due to IV pain medication given.

## 2020-06-07 ENCOUNTER — Inpatient Hospital Stay: Payer: Self-pay

## 2020-06-07 ENCOUNTER — Inpatient Hospital Stay (HOSPITAL_BASED_OUTPATIENT_CLINIC_OR_DEPARTMENT_OTHER): Payer: Self-pay | Admitting: Oncology

## 2020-06-07 ENCOUNTER — Ambulatory Visit
Admission: RE | Admit: 2020-06-07 | Discharge: 2020-06-07 | Disposition: A | Discharge status: Home / Self Care | Payer: Self-pay | Source: Ambulatory Visit | Attending: Oncology | Admitting: Oncology

## 2020-06-07 ENCOUNTER — Encounter: Payer: Self-pay | Admitting: Nurse Practitioner

## 2020-06-07 ENCOUNTER — Other Ambulatory Visit: Payer: Self-pay

## 2020-06-07 VITALS — BP 123/73 | HR 74 | Temp 99.4°F | Resp 17 | Wt 138.0 lb

## 2020-06-07 DIAGNOSIS — A0472 Enterocolitis due to Clostridium difficile, not specified as recurrent: Secondary | ICD-10-CM

## 2020-06-07 DIAGNOSIS — E876 Hypokalemia: Secondary | ICD-10-CM

## 2020-06-07 DIAGNOSIS — C7951 Secondary malignant neoplasm of bone: Secondary | ICD-10-CM

## 2020-06-07 DIAGNOSIS — C50919 Malignant neoplasm of unspecified site of unspecified female breast: Secondary | ICD-10-CM | POA: Insufficient documentation

## 2020-06-07 DIAGNOSIS — A498 Other bacterial infections of unspecified site: Secondary | ICD-10-CM | POA: Insufficient documentation

## 2020-06-07 LAB — COMPREHENSIVE METABOLIC PANEL
ALT: 14 U/L (ref 0–44)
AST: 16 U/L (ref 15–41)
Albumin: 2.8 g/dL — ABNORMAL LOW (ref 3.5–5.0)
Alkaline Phosphatase: 48 U/L (ref 38–126)
Anion gap: 7 (ref 5–15)
BUN: <5 mg/dL — ABNORMAL LOW (ref 6–20)
CO2: 24 mmol/L (ref 22–32)
Calcium: 7.3 mg/dL — ABNORMAL LOW (ref 8.9–10.3)
Chloride: 108 mmol/L (ref 98–111)
Creatinine, Ser: 0.39 mg/dL — ABNORMAL LOW (ref 0.44–1.00)
GFR, Estimated: >60 mL/min (ref >60)
Glucose, Bld: 104 mg/dL — ABNORMAL HIGH (ref 70–99)
Potassium: 2.8 mmol/L — ABNORMAL LOW (ref 3.5–5.1)
Sodium: 139 mmol/L (ref 135–145)
Total Bilirubin: 0.2 mg/dL — ABNORMAL LOW (ref 0.3–1.2)
Total Protein: 5.4 g/dL — ABNORMAL LOW (ref 6.5–8.1)

## 2020-06-07 LAB — CBC WITH DIFFERENTIAL/PLATELET
Abs Immature Granulocytes: 0.7 10*3/uL — ABNORMAL HIGH (ref 0.00–0.07)
Basophils Absolute: 0 10*3/uL (ref 0.0–0.1)
Basophils Relative: 0 %
Eosinophils Absolute: 0.1 10*3/uL (ref 0.0–0.5)
Eosinophils Relative: 1 %
HCT: 28.1 % — ABNORMAL LOW (ref 36.0–46.0)
Hemoglobin: 9.2 g/dL — ABNORMAL LOW (ref 12.0–15.0)
Lymphocytes Relative: 24 %
Lymphs Abs: 1.5 10*3/uL (ref 0.7–4.0)
MCH: 27.5 pg (ref 26.0–34.0)
MCHC: 32.7 g/dL (ref 30.0–36.0)
MCV: 83.9 fL (ref 80.0–100.0)
Metamyelocytes Relative: 4 %
Monocytes Absolute: 0.6 10*3/uL (ref 0.1–1.0)
Monocytes Relative: 9 %
Myelocytes: 7 %
Neutro Abs: 3.5 10*3/uL (ref 1.7–7.7)
Neutrophils Relative %: 55 %
Platelets: 398 10*3/uL (ref 150–400)
RBC: 3.35 MIL/uL — ABNORMAL LOW (ref 3.87–5.11)
RDW: 17.6 % — ABNORMAL HIGH (ref 11.5–15.5)
Smear Review: ADEQUATE
WBC: 6.3 10*3/uL (ref 4.0–10.5)
nRBC: 0 % (ref 0.0–0.2)

## 2020-06-07 LAB — CULTURE, BLOOD (ROUTINE X 2)
Culture: NO GROWTH
Culture: NO GROWTH

## 2020-06-07 IMAGING — CR DG HIP (WITH OR WITHOUT PELVIS) 2-3V*R*
1 series · 5 of 5 positions shown · non-contrast
Comparison: [DATE] right hip radiographs

CLINICAL DATA: Fall [REDACTED] with posterolateral right hip
pain. Metastatic breast cancer.

EXAM:
DG HIP (WITH OR WITHOUT PELVIS) 2-3V RIGHT

[Series 1: dg hip unilat w or w/o pelvis 2-3 views  · non-contrast · 0.14mm/px · 5 of 5 slices shown]
[im 1/5]
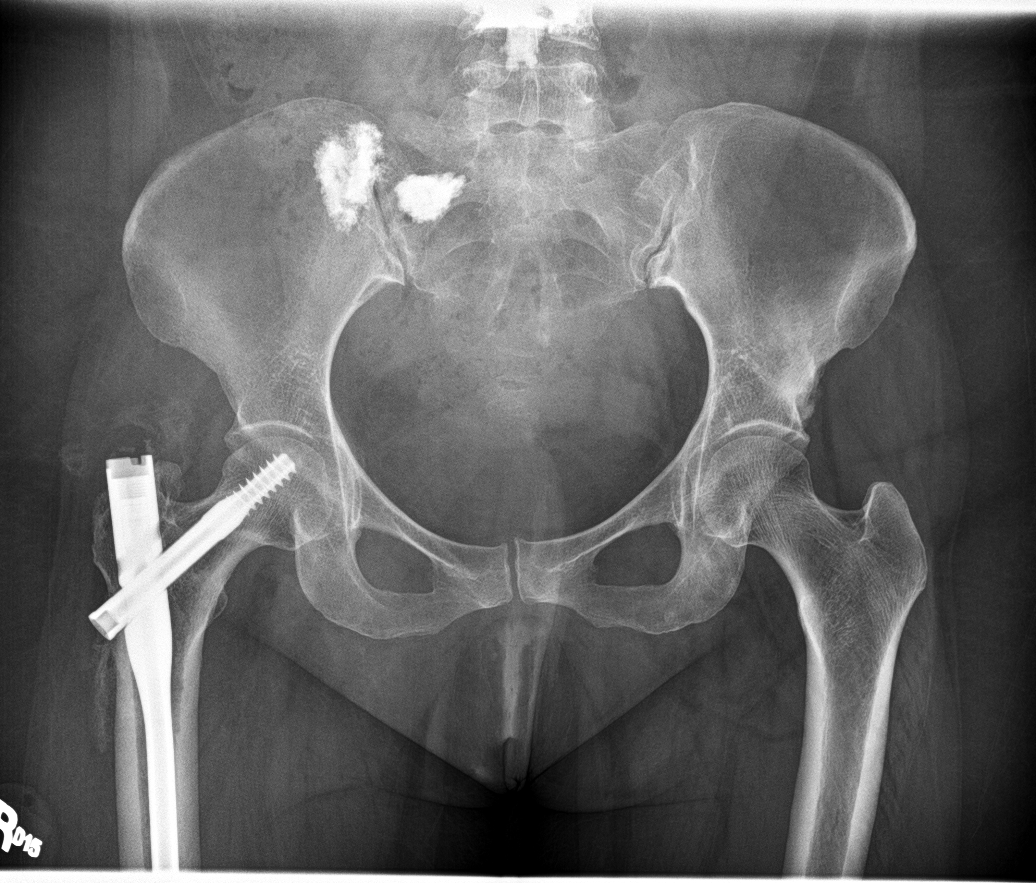
[im 2/5]
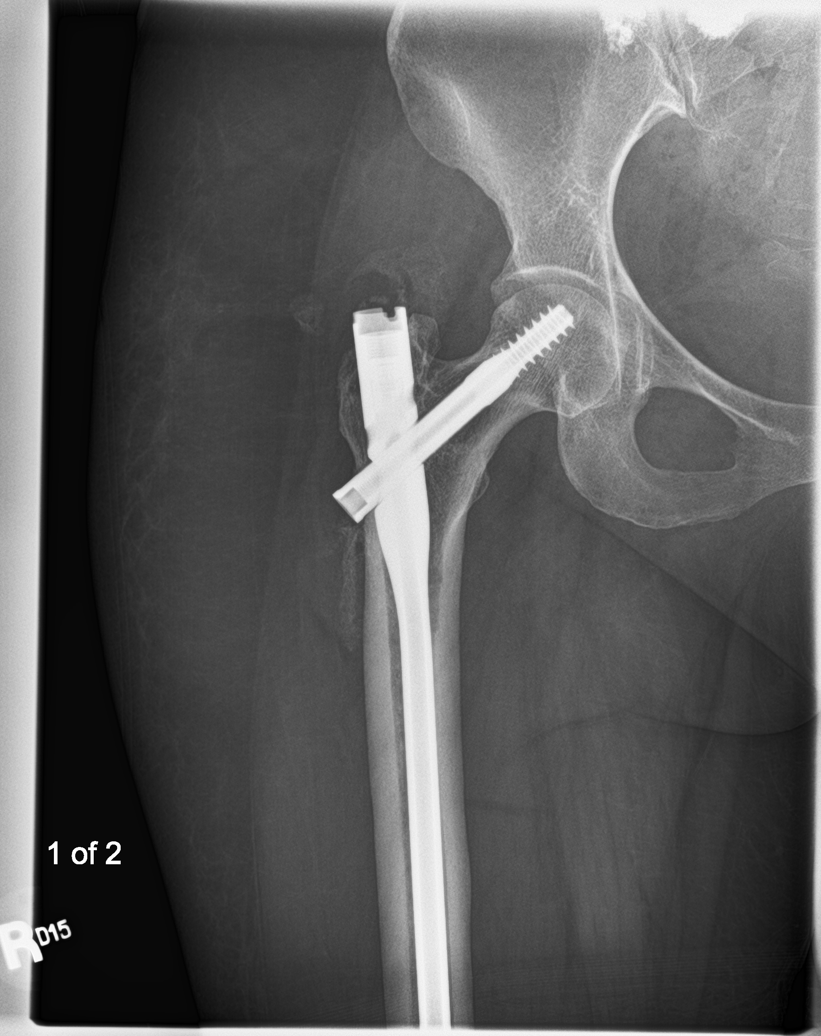
[im 3/5]
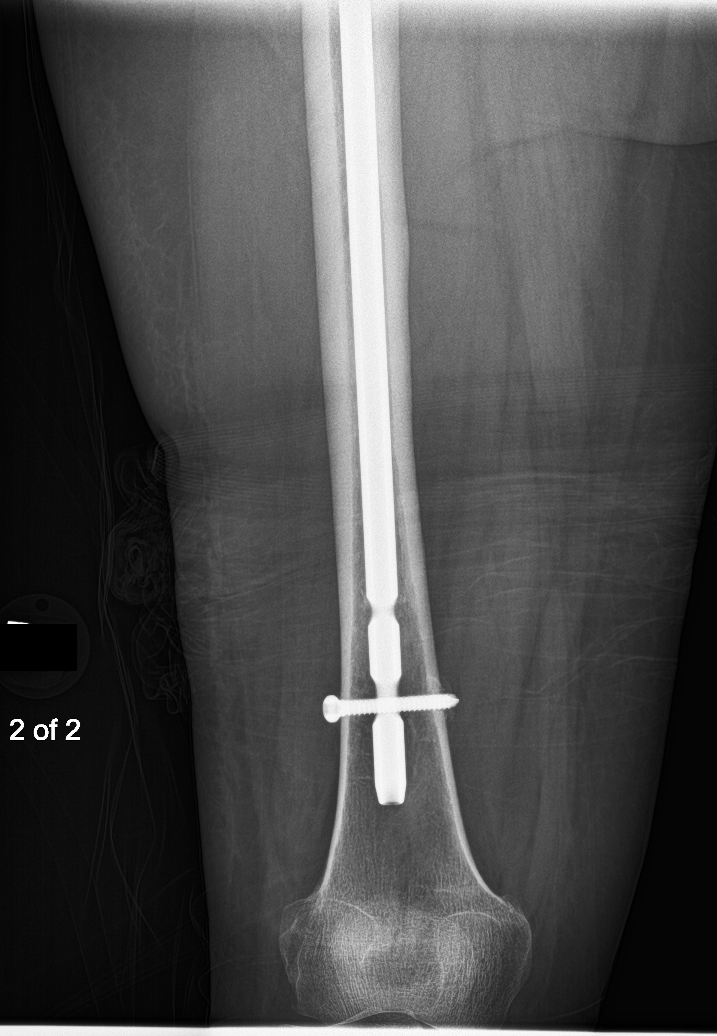
[im 4/5]
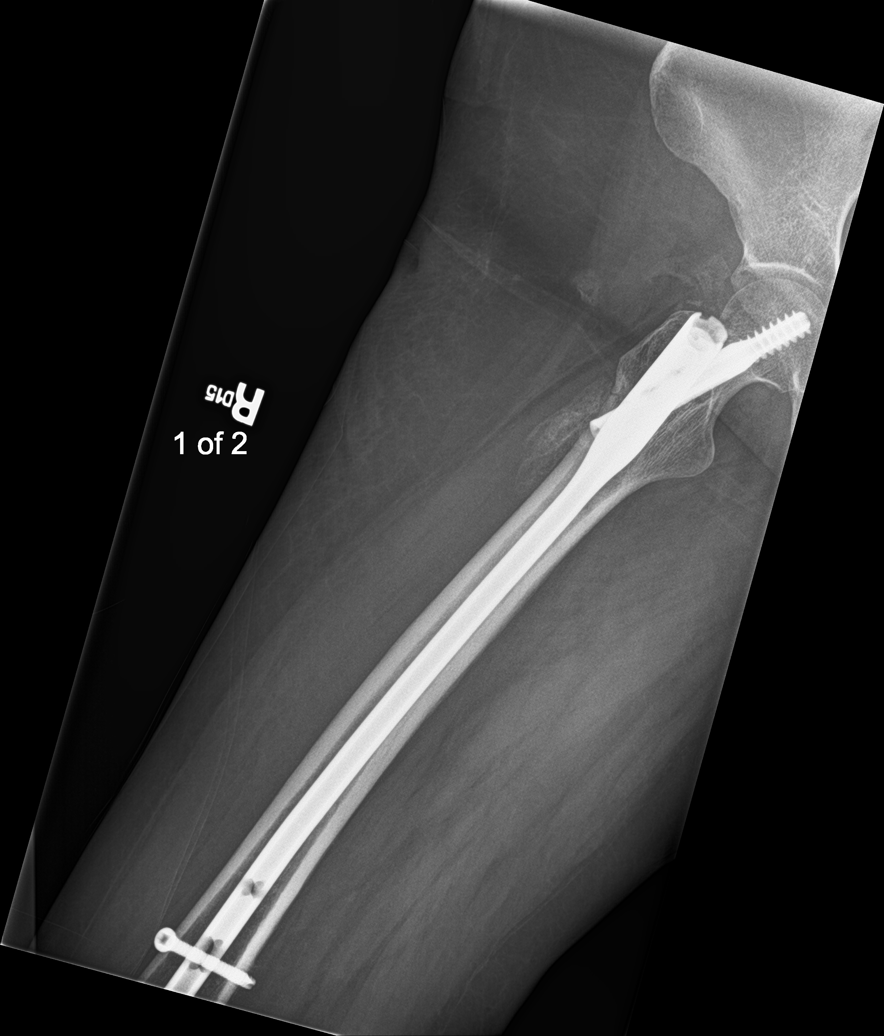
[im 5/5]
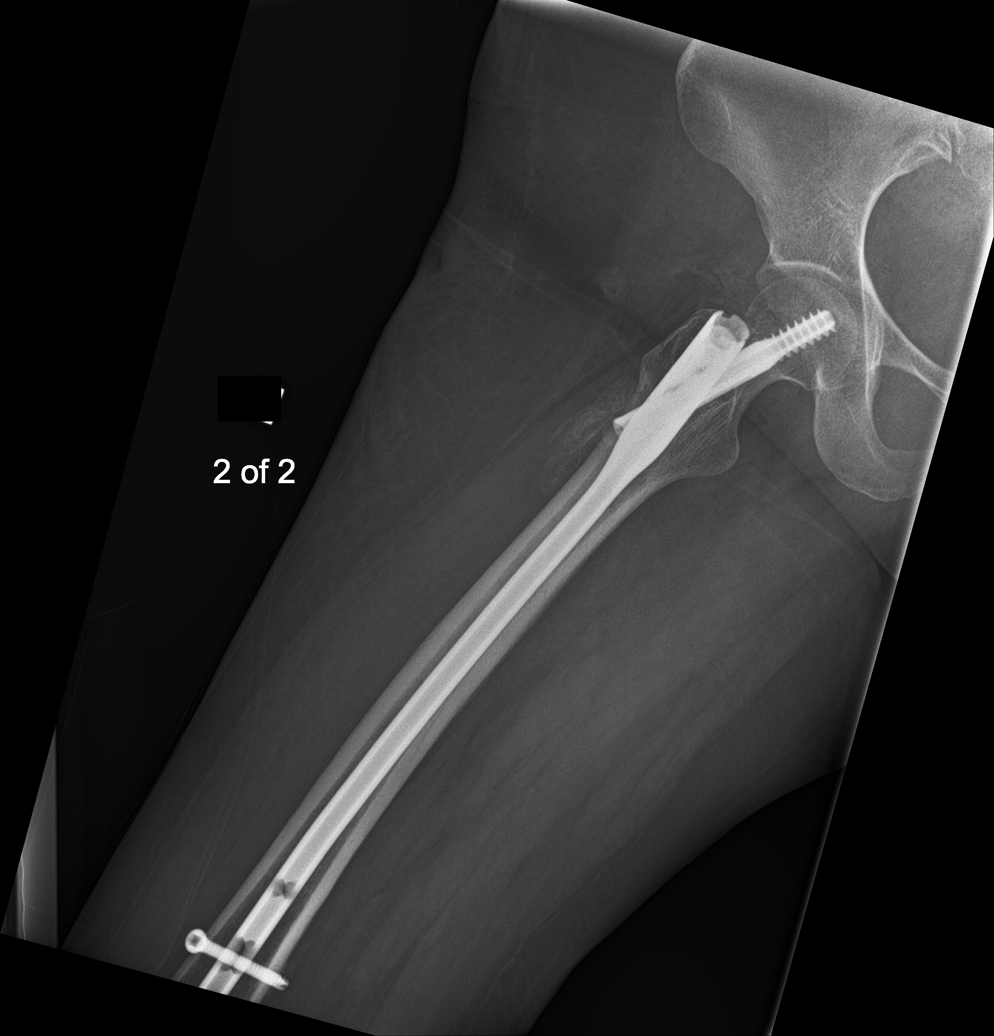

[5 of 5 positions shown; findings below may reference images not displayed]

FINDINGS: Intramedullary rod in the right femoral shaft with interlocking
right femoral neck pin and distal interlocking screw with no
evidence of hardware fracture or loosening. Linear cortical
discontinuity in the anterior right proximal femoral metaphysis on
the lateral view, cannot exclude a small nondisplaced periprosthetic
fracture. No right hip dislocation. Otherwise no potential acute
pelvic fracture. Dense material in the upper right sacrum and medial
right iliac bone. Sclerotic lesion in the supra-acetabular
peripheral left iliac bone as seen on recent CT abdomen/pelvis
study.
IMPRESSION: 1. Linear cortical discontinuity in the anterior right proximal
femoral metaphysis on the lateral view, cannot exclude a small
nondisplaced periprosthetic fracture. Right hip CT may be obtained
for further evaluation as clinically warranted.
2. Sclerotic lesion in supra-acetabular left iliac bone as seen on
recent CT abdomen study, compatible with metastasis.

## 2020-06-07 MED ORDER — POTASSIUM CHLORIDE CRYS ER 10 MEQ PO TBCR
40.0000 meq | EXTENDED_RELEASE_TABLET | Freq: Once | ORAL | Status: AC
  Administered 2020-06-07: 40 meq via ORAL

## 2020-06-07 MED ORDER — POTASSIUM CHLORIDE CRYS ER 20 MEQ PO TBCR: 20.0000 meq | EXTENDED_RELEASE_TABLET | Freq: Two times a day (BID) | ORAL | 0 refills | Status: DC

## 2020-06-07 MED ORDER — SODIUM CHLORIDE 0.9% FLUSH
10.0000 mL | Freq: Once | INTRAVENOUS | Status: AC
  Administered 2020-06-07: 10 mL via INTRAVENOUS
  Filled 2020-06-07: qty 10

## 2020-06-07 MED ORDER — MORPHINE SULFATE (PF) 2 MG/ML IV SOLN: 2.0000 mg | Freq: Once | INTRAVENOUS | Status: DC

## 2020-06-07 MED ORDER — HEPARIN SOD (PORK) LOCK FLUSH 100 UNIT/ML IV SOLN
500.0000 [IU] | Freq: Once | INTRAVENOUS | Status: AC
  Administered 2020-06-07: 500 [IU] via INTRAVENOUS
  Filled 2020-06-07: qty 5

## 2020-06-07 MED ORDER — POTASSIUM CHLORIDE 20 MEQ/100ML IV SOLN
20.0000 meq | Freq: Once | INTRAVENOUS | Status: AC
  Administered 2020-06-07: 20 meq via INTRAVENOUS

## 2020-06-07 MED ORDER — MORPHINE SULFATE (PF) 2 MG/ML IV SOLN
2.0000 mg | Freq: Once | INTRAVENOUS | Status: AC
  Administered 2020-06-07: 2 mg via INTRAVENOUS
  Filled 2020-06-07: qty 1

## 2020-06-07 MED ORDER — SODIUM CHLORIDE 0.9 % IV SOLN
INTRAVENOUS | Status: DC
  Filled 2020-06-07 (×2): qty 250

## 2020-06-07 MED ORDER — POTASSIUM CHLORIDE 20 MEQ/100ML IV SOLN: 20.0000 meq | Freq: Once | INTRAVENOUS | Status: DC

## 2020-06-07 MED ORDER — POTASSIUM CHLORIDE CRYS ER 10 MEQ PO TBCR: 40.0000 meq | EXTENDED_RELEASE_TABLET | Freq: Once | ORAL | Status: DC

## 2020-06-07 NOTE — Progress Notes (Signed)
Symptom Management Consult note [facility]  Telephone:([phone] [phone]  Patient Care Team: [facility], [place], NP as PCP - General Rico Junker, RN as Registered Nurse Theodore Demark, RN as Registered Nurse Sindy Guadeloupe, MD as Consulting Physician (Hematology and Oncology)   Name of the patient: Sophia [name]  [id number]  Apr 27, 1980   Date of visit: 06/07/2020   Diagnosis- Breast cancer with mets to bone  Chief complaint/ Reason for visit- C-Diff follow-up  Heme/Onc history: Sophia [name] is a 40 year old female with past medical history significant for anxiety and leukocytosis who is followed by Dr. Janese Banks for metastatic breast cancer.  She is currently status post 13 cycles of Taxol plus Perjeta and Kanjinti.  She received Taxol on 05/28/2020.  PET and MRI showed 3 areas of pathological fracture of her spine as well as widespread bony metastasis disease and concern for impending fracture of her right hip.  She had kyphoplasty at 3 different levels and prophylactic fixation of right hip.  She started her first dose of Herceptin and Perjeta on 09/04/2019.  She has history of acute colitis in July 2021.  Colonoscopy showed few severe inflammation with erythema friability and loss of vascularity and shallow ulcerations in the ascending colon and cecum.  She was treated conservatively and symptoms resolved on their own.  Interval history-today Sophia [name] presents for follow-up after recent diagnosis of C. Difficile.  She was seen in clinic on 06/02/2020 for fever, body aches, intermittent abdominal pain and diarrhea.  Work-up included labs which revealed leukocytosis with a white count of 19.1, negative urinalysis and negative chest x-ray. She was given IV fluids, Tylenol and Zofran.  She was unable to produce a bowel movement while in clinic so she was instructed to return sample once collected at home.  She was started on  Levaquin x7 days.    She presented back on 06/03/2020 with worsening abdominal pain and diarrhea.  Stool sample was positive for C. Difficile/ EPEC. Labs showed hypokalemia with a potassium of 2.7.  She was given IV potassium and started on vancomycin.  Levaquin was discontinued.  Today she presents for follow-up.  Reports she fell on to her right hip early Friday morning in the bathroom after having a bowel movement.  Endorses pain starting in right hip that extends down left thigh.  She is able to bear weight but has noticed some swelling.  She is taking her narcotics as prescribed.  Reports diarrhea and abdominal pain has improved since starting vancomycin.  She denies any additional fevers.    ECOG FS:2 - Symptomatic, <50% confined to bed  Review of systems- Review of Systems  Constitutional: Positive for malaise/fatigue. Negative for chills, fever and weight loss.  HENT: Negative for congestion, ear pain and tinnitus.   Eyes: Negative.  Negative for blurred vision and double vision.  Respiratory: Negative.  Negative for cough, sputum production and shortness of breath.   Cardiovascular: Negative.  Negative for chest pain, palpitations and leg swelling.  Gastrointestinal: Positive for abdominal pain (With bowel movements) and diarrhea. Negative for constipation, nausea and vomiting.  Genitourinary: Negative for dysuria, frequency and urgency.  Musculoskeletal: Positive for falls. Negative for back pain.  Skin: Negative.  Negative for rash.  Neurological: Negative.  Negative for weakness and headaches.  Endo/Heme/Allergies: Negative.  Does not bruise/bleed easily.  Psychiatric/Behavioral: Negative.  Negative for depression. The patient is not nervous/anxious and does not have insomnia.  Current treatment-status post 13 cycles of Herceptin Perjeta and Taxol.  Last given on 05/28/2020.  No Known Allergies   Past Medical History:  Diagnosis Date  . Anxiety   . Breast cancer ([place])     with mets  . Cancer ([place])   . Colitis   . Family history of colon cancer   . Vertigo      Past Surgical History:  Procedure Laterality Date  . BREAST BIOPSY Left 08/14/2019   [place] bx of mass, coil marker, path pending  . BREAST BIOPSY Left 08/14/2019   [place] bx of LN, hydromarker, path pending  . BREAST BIOPSY Left 08/14/2019   affirm bx of calcs, x marker, path pending  . ESOPHAGOGASTRODUODENOSCOPY (EGD) WITH PROPOFOL N/A 10/05/2019   Procedure: ESOPHAGOGASTRODUODENOSCOPY (EGD) WITH PROPOFOL;  Surgeon: Lin Landsman, MD;  Location: [place];  Service: Gastroenterology;  Laterality: N/A;  . FLEXIBLE SIGMOIDOSCOPY N/A 10/05/2019   Procedure: FLEXIBLE SIGMOIDOSCOPY;  Surgeon: Lin Landsman, MD;  Location: [facility] ENDOSCOPY;  Service: Gastroenterology;  Laterality: N/A;  . INTRAMEDULLARY (IM) NAIL INTERTROCHANTERIC Right 09/01/2019   Procedure: INTRAMEDULLARY (IM) NAIL INTERTROCHANTRIC AND RADIOFREQUENCY ABLATION;  Surgeon: Hessie Knows, MD;  Location: [facility] ORS;  Service: Orthopedics;  Laterality: Right;  . KYPHOPLASTY N/A 08/29/2019   Procedure: KYPHOPLASTY T6, L1,L4 ,  RADIOFREQUENCY ABLATION;  Surgeon: Hessie Knows, MD;  Location: [facility] ORS;  Service: Orthopedics;  Laterality: N/A;  . KYPHOPLASTY Right 09/01/2019   Procedure: Right Sacral Radiofrequency Ablation and Cement Augmentation, Right sacrum and iliac crest;  Surgeon: Hessie Knows, MD;  Location: [facility] OR[facility];  Service: Orthopedics;  Laterality: Right;  . PORTA CATH INSERTION N/A 08/28/2019   Procedure: PORTA CATH INSERTION;  Surgeon: Algernon Huxley, MD;  Location: [place] CV LAB;  Service: Cardiovascular;  Laterality: N/A;    Social History   Socioeconomic History  . Marital status: Single    Spouse name: Not on file  . Number of children: Not on file  . Years of education: Not on file  . Highest education level: Not on file  Occupational History  . Not on file  Tobacco Use  . Smoking status: Never Smoker   . Smokeless tobacco: Never Used  Vaping Use  . Vaping Use: Never used  Substance and Sexual Activity  . Alcohol use: Not Currently  . Drug use: Not Currently  . Sexual activity: Not Currently    Birth control/protection: None  Other Topics Concern  . Not on file  Social History Narrative   Lives at home with children   Social Determinants of Health   Financial Resource Strain: Not on file  Food Insecurity: Not on file  Transportation Needs: Not on file  Physical Activity: Not on file  Stress: Not on file  Social Connections: Not on file  Intimate Partner Violence: Not on file    Family History  Problem Relation Age of Onset  . Colon cancer Maternal Uncle      Current Outpatient Medications:  .  potassium chloride SA (KLOR-CON) 20 MEQ tablet, Take 1 tablet (20 mEq total) by mouth 2 (two) times daily., Disp: 14 tablet, Rfl: 0 .  clindamycin (CLINDAGEL) 1 % gel, Apply topically 2 (two) times daily. X 7 days., Disp: 30 g, Rfl: 0 .  diphenoxylate-atropine (LOMOTIL) 2.5-0.025 MG tablet, Take 1 tablet by mouth 4 (four) times daily as needed for diarrhea or loose stools., Disp: 40 tablet, Rfl: 0 .  FLUoxetine (PROZAC) 10 MG capsule, Take 1 capsule (10 mg total)  by mouth daily., Disp: 30 capsule, Rfl: 3 .  gabapentin (NEURONTIN) 300 MG capsule, Take 1 capsule (300 mg total) by mouth 3 (three) times daily., Disp: 180 capsule, Rfl: 0 .  lidocaine-prilocaine (EMLA) cream, Apply 1 application topically as needed. Apply small amount to port site at least 1 hour prior to it being accessed, cover with plastic wrap, Disp: 30 g, Rfl: 1 .  oxycodone (OXY-IR) 5 MG capsule, Take 1-2 capsules (5-10 mg total) by mouth every 4 (four) hours as needed., Disp: 60 capsule, Rfl: 0 .  oxyCODONE (OXYCONTIN) 10 mg 12 hr tablet, Take 1 tablet (10 mg total) by mouth every 12 (twelve) hours., Disp: 60 tablet, Rfl: 0 .  potassium chloride SA (KLOR-CON) 20 MEQ tablet, Take 1 tablet (20 mEq total) by mouth  daily., Disp: 30 tablet, Rfl: 0 .  vancomycin (VANCOCIN) 125 MG capsule, Take 1 capsule (125 mg total) by mouth 4 (four) times daily for 10 days. For C Diff infection, Disp: 40 capsule, Rfl: 0  Current Facility-Administered Medications:  .  0.9 %  sodium chloride infusion, , Intravenous, Continuous, Navie Lamoreaux, Wandra Feinstein, NP, Last Rate: 999 mL/hr at 06/07/20 1009, New Bag at 06/07/20 1009 .  morphine 2 MG/ML injection 2 mg, 2 mg, Intravenous, Once, Randie Bloodgood, Wandra Feinstein, NP  Facility-Administered Medications Ordered in Other Visits:  .  0.9 %  sodium chloride infusion, , Intravenous, Continuous, Chad Donoghue, Wandra Feinstein, NP, Stopped at 06/02/20 1453 .  morphine 2 MG/ML injection 4 mg, 4 mg, Intravenous, Once, Verlon Au, NP .  ondansetron [facility]) injection 8 mg, 8 mg, Intravenous, Once, Verlon Au, NP  Physical exam:  Vitals:   06/07/20 0938  BP: 123/73  Pulse: 74  Resp: 17  Temp: 99.4 F (37.4 C)  TempSrc: Tympanic  SpO2: 100%  Weight: 138 lb (62.6 kg)   Physical Exam Constitutional:      Appearance: Normal appearance.  HENT:     Head: Normocephalic and atraumatic.  Eyes:     Pupils: Pupils are equal, round, and reactive to light.  Cardiovascular:     Rate and Rhythm: Normal rate and regular rhythm.     Heart sounds: Normal heart sounds. No murmur heard.   Pulmonary:     Effort: Pulmonary effort is normal.     Breath sounds: Normal breath sounds. No wheezing.  Abdominal:     General: Bowel sounds are normal. There is no distension.     Palpations: Abdomen is soft.     Tenderness: There is abdominal tenderness in the epigastric area.  Musculoskeletal:        General: Normal range of motion.     Cervical back: Normal range of motion.     Right hip: Tenderness present.     Right upper leg: Swelling and tenderness present.  Skin:    General: Skin is warm and dry.     Findings: No rash.  Neurological:     Mental Status: She is alert and oriented to person, place, and time.   Psychiatric:        Judgment: Judgment normal.      CMP Latest Ref Rng & Units 06/07/2020  Glucose 70 - 99 mg/dL 104(H)  BUN 6 - 20 mg/dL <5(L)  Creatinine 0.44 - 1.00 mg/dL 0.39(L)  Sodium 135 - 145 mmol/L 139  Potassium 3.5 - 5.1 mmol/L 2.8(L)  Chloride 98 - 111 mmol/L 108  CO2 22 - 32 mmol/L 24  Calcium 8.9 - 10.3 mg/dL 7.3(L)  Total Protein 6.5 - 8.1 g/dL 5.4(L)  Total Bilirubin 0.3 - 1.2 mg/dL 0.2(L)  Alkaline Phos 38 - 126 U/L 48  AST 15 - 41 U/L 16  ALT 0 - 44 U/L 14   CBC Latest Ref Rng & Units 06/07/2020  WBC 4.0 - 10.5 K/uL 6.3  Hemoglobin 12.0 - 15.0 g/dL 9.2(L)  Hematocrit 36.0 - 46.0 % 28.1(L)  Platelets 150 - 400 K/uL 398    No images are attached to the encounter.  DG Chest 2 View  Result Date: 06/02/2020 CLINICAL DATA:  Fever.  History of metastatic breast carcinoma EXAM: CHEST - 2 VIEW COMPARISON:  October 01, 2019 chest radiograph; chest CT April 01, 2020 FINDINGS: There is slight scarring in the medial left upper lobe. The lungs elsewhere are clear. Heart size and pulmonary vascular normal. No adenopathy. Port-A-Cath tip at cavoatrial junction. No pneumothorax. Patient has had previous kyphoplasty procedures at T7, L2, and L5. No blastic or lytic bone lesions evident by radiography. IMPRESSION: Mild scarring left upper lobe. Lungs otherwise clear. Heart size normal. No adenopathy. Previous kyphoplasty procedures at several levels. Electronically Signed   By: Lowella Grip III M.D.   On: 06/02/2020 11:23   CT Abdomen Pelvis W Contrast  Result Date: 06/03/2020 CLINICAL DATA:  Concern for gastroenteritis. Abdominal pain. History of breast cancer. Chemotherapy ongoing. EXAM: CT ABDOMEN AND PELVIS WITH CONTRAST TECHNIQUE: Multidetector CT imaging of the abdomen and pelvis was performed using the standard protocol following bolus administration of intravenous contrast. CONTRAST:  174mL OMNIPAQUE IOHEXOL 300 MG/ML  SOLN COMPARISON:  CT 04/01/2020 FINDINGS: Lower  chest: Lung bases are clear. Hepatobiliary: No focal hepatic lesion. No biliary duct dilatation. Common bile duct is normal. Pancreas: Pancreas is normal. No ductal dilatation. No pancreatic inflammation. Spleen: Normal spleen Adrenals/urinary tract: Adrenal glands and kidneys are normal. The ureters and bladder normal. Stomach/Bowel: Stomach, duodenum small-bowel normal. No evidence of small bowel inflammation or obstruction. Terminal ileum is normal. Appendix is normal. The entire colon is collapsed. Sigmoid colon and rectum are collapsed. No clear evidence of colonic inflammation. No mesenteric inflammation. Vascular/Lymphatic: Abdominal aorta is normal caliber. No periportal or retroperitoneal adenopathy. No pelvic adenopathy. Reproductive: Uterus and adnexa unremarkable. Other: No free fluid.  No peritoneal disease Musculoskeletal: No aggressive osseous lesion. Chronic compression fracture of the lumbar spine with augmentation. Round sclerotic lesions at T12 and T10 are not changed from comparison CT. Additional smaller lesions in the lumbar spine. The lesions are not hypermetabolic on comparison PET-CT scan 12/16/2019. Sacral plasty in the RIGHT sacral ala. IMPRESSION: 1. No evidence of inflammation or obstruction the small bowel or colon. 2. Entire colon is collapsed without stool. 3. Gallbladder appendix are normal. 4. Round sclerotic skeletal lesions again noted. No interval change. Electronically Signed   By: Suzy Bouchard M.D.   On: 06/03/2020 11:30     Assessment and plan- Patient is a 40 y.o. female who presents to symptom management for follow-up for recent diagnosis of C. Difficile/EPEC infection.  Metastatic breast cancer: -She is status post 13 cycles of Herceptin, Perjeta and Taxol.  Last treatment given on 05/28/2020 (Taxol only). -PET scan and MRI showed 3 areas of pathological fracture of her spine as well as widespread bony metastasis disease concerning for impending fracture of right  hip.  She received a kyphoplasty at 3 different levels and underwent prophylactic fixation of right hip. -She has done fairly well with her treatments except for some gradual peripheral neuropathy. -Treatment was held on 06/04/2020  secondary to C. Difficile/EPEC infection.  Diarrhea-intermittent epigastric pain: -Stool positive for C. Difficile/EPEC -She was started on vancomycin 4 times daily. -Symptoms appear to be improving.  Hypokalemia: -Labs from 06/07/2020 show potassium level of 2.8. -We will give 20 mEq of IV potassium while in clinic. -Increase potassium from 20 mEq to 40 mEq for the next 5 days.  Hypocalcemia: -Calcium level from 06/07/2020 is 7.3 (corrected 8.26 mg/dL).  -Monitor.  Right hip pain: -Secondary to fall last week. -Has history of right hip fixation back in December d/t bony mets. -We will get x-ray of right hip given persistent pain and swelling. -Continue pain medicine as prescribed. -We will give 2 mg of morphine while in clinic. -We will call with results.  Disposition: -X-ray of right hip-Stat -RTC on Wednesday, 06/09/2020 for follow-up with lab work and possible IV fluids. -RTC on 06/11/2020 for next cycle of chemotherapy.   Visit Diagnosis 1. C. difficile diarrhea   2. Metastatic breast cancer ([place])   3. Bone metastases ([place])   4. Hypokalemia     Patient expressed understanding and was in agreement with this plan. She also understands that She can call clinic at any time with any questions, concerns, or complaints.   Greater than 50% was spent in counseling and coordination of care with this patient including but not limited to discussion of the relevant topics above (See A&P) including, but not limited to diagnosis and management of acute and chronic medical conditions.   Thank you for allowing me to participate in the care of this very pleasant patient.    Jacquelin Hawking, NP [place] [facility] Cell - [phone] Pager-  [phone] 06/07/2020 1:32 PM

## 2020-06-08 ENCOUNTER — Other Ambulatory Visit: Payer: Self-pay | Admitting: Oncology

## 2020-06-08 DIAGNOSIS — Y92009 Unspecified place in unspecified non-institutional (private) residence as the place of occurrence of the external cause: Secondary | ICD-10-CM

## 2020-06-08 DIAGNOSIS — W19XXXA Unspecified fall, initial encounter: Secondary | ICD-10-CM

## 2020-06-08 NOTE — Progress Notes (Signed)
Re: Fall   Reviewed imaging of right hip with Dr. Janese Banks.  Findings show linear cortical discontinuity in the anterior right proximal femoral metaphysis on the lateral view cannot exclude a small nondisplaced periprosthetic fracture.  Right hip CT may be obtained for further evaluation as clinically warranted.  Discussed with Dr. Rudene Christians (ortho) who recommends follow-up xray in approximately 4 weeks and evaluation in orthopedics.  He does not believe additional surgery is needed at this time.  Imaging orders placed.    Message sent to arrange for follow-up in orthopedics in approximately 4 weeks.   Faythe Casa, NP 06/08/2020 9:46 AM

## 2020-06-09 ENCOUNTER — Inpatient Hospital Stay: Payer: Self-pay

## 2020-06-09 ENCOUNTER — Other Ambulatory Visit: Payer: Self-pay

## 2020-06-09 VITALS — BP 113/62 | HR 72 | Temp 98.8°F | Resp 16

## 2020-06-09 DIAGNOSIS — E876 Hypokalemia: Secondary | ICD-10-CM

## 2020-06-09 LAB — CBC WITH DIFFERENTIAL/PLATELET
Abs Immature Granulocytes: 0.66 10*3/uL — ABNORMAL HIGH (ref 0.00–0.07)
Basophils Absolute: 0.1 10*3/uL (ref 0.0–0.1)
Basophils Relative: 1 %
Eosinophils Absolute: 0.3 10*3/uL (ref 0.0–0.5)
Eosinophils Relative: 3 %
HCT: 31.4 % — ABNORMAL LOW (ref 36.0–46.0)
Hemoglobin: 10.2 g/dL — ABNORMAL LOW (ref 12.0–15.0)
Immature Granulocytes: 8 %
Lymphocytes Relative: 15 %
Lymphs Abs: 1.2 10*3/uL (ref 0.7–4.0)
MCH: 27.1 pg (ref 26.0–34.0)
MCHC: 32.5 g/dL (ref 30.0–36.0)
MCV: 83.3 fL (ref 80.0–100.0)
Monocytes Absolute: 0.8 10*3/uL (ref 0.1–1.0)
Monocytes Relative: 9 %
Neutro Abs: 5.5 10*3/uL (ref 1.7–7.7)
Neutrophils Relative %: 64 %
Platelets: 440 10*3/uL — ABNORMAL HIGH (ref 150–400)
RBC: 3.77 MIL/uL — ABNORMAL LOW (ref 3.87–5.11)
RDW: 17.7 % — ABNORMAL HIGH (ref 11.5–15.5)
Smear Review: NORMAL
WBC: 8.4 10*3/uL (ref 4.0–10.5)
nRBC: 0 % (ref 0.0–0.2)

## 2020-06-09 LAB — COMPREHENSIVE METABOLIC PANEL
ALT: 17 U/L (ref 0–44)
AST: 24 U/L (ref 15–41)
Albumin: 3.3 g/dL — ABNORMAL LOW (ref 3.5–5.0)
Alkaline Phosphatase: 55 U/L (ref 38–126)
Anion gap: 7 (ref 5–15)
BUN: 8 mg/dL (ref 6–20)
CO2: 25 mmol/L (ref 22–32)
Calcium: 8.4 mg/dL — ABNORMAL LOW (ref 8.9–10.3)
Chloride: 105 mmol/L (ref 98–111)
Creatinine, Ser: 0.32 mg/dL — ABNORMAL LOW (ref 0.44–1.00)
GFR, Estimated: >60 mL/min (ref >60)
Glucose, Bld: 88 mg/dL (ref 70–99)
Potassium: 3.5 mmol/L (ref 3.5–5.1)
Sodium: 137 mmol/L (ref 135–145)
Total Bilirubin: 0.6 mg/dL (ref 0.3–1.2)
Total Protein: 6.2 g/dL — ABNORMAL LOW (ref 6.5–8.1)

## 2020-06-09 MED ORDER — HEPARIN SOD (PORK) LOCK FLUSH 100 UNIT/ML IV SOLN
500.0000 [IU] | Freq: Once | INTRAVENOUS | Status: AC
  Administered 2020-06-09: 500 [IU] via INTRAVENOUS
  Filled 2020-06-09: qty 5

## 2020-06-09 MED ORDER — OXYCODONE HCL 5 MG PO CAPS: 5.0000 mg | ORAL_CAPSULE | ORAL | 0 refills | Status: DC | PRN

## 2020-06-09 MED ORDER — SODIUM CHLORIDE 0.9% FLUSH
10.0000 mL | Freq: Once | INTRAVENOUS | Status: AC
  Administered 2020-06-09: 10 mL via INTRAVENOUS
  Filled 2020-06-09: qty 10

## 2020-06-09 NOTE — Progress Notes (Signed)
Pt reports that she is feeling much better, has very little diarrhea, and is tolerating PO's. Electrolytes and WBC's WNL. No fever. No fluids needed today, per Beckey Rutter, NP.

## 2020-06-10 ENCOUNTER — Other Ambulatory Visit: Payer: Self-pay | Admitting: *Deleted

## 2020-06-10 MED FILL — Firvanq - vanc oral solution 50mg/mL: ORAL | Qty: 2.5 | Status: AC

## 2020-06-11 ENCOUNTER — Inpatient Hospital Stay (HOSPITAL_BASED_OUTPATIENT_CLINIC_OR_DEPARTMENT_OTHER): Payer: Self-pay | Admitting: Hospice and Palliative Medicine

## 2020-06-11 ENCOUNTER — Inpatient Hospital Stay (HOSPITAL_BASED_OUTPATIENT_CLINIC_OR_DEPARTMENT_OTHER): Payer: Self-pay | Admitting: Oncology

## 2020-06-11 ENCOUNTER — Encounter: Payer: Self-pay | Admitting: Oncology

## 2020-06-11 ENCOUNTER — Inpatient Hospital Stay: Payer: Self-pay

## 2020-06-11 ENCOUNTER — Other Ambulatory Visit: Payer: Self-pay

## 2020-06-11 VITALS — BP 112/87 | HR 72 | Temp 99.9°F | Wt 135.5 lb

## 2020-06-11 DIAGNOSIS — M8440XA Pathological fracture, unspecified site, initial encounter for fracture: Secondary | ICD-10-CM

## 2020-06-11 DIAGNOSIS — C50919 Malignant neoplasm of unspecified site of unspecified female breast: Secondary | ICD-10-CM

## 2020-06-11 DIAGNOSIS — Z515 Encounter for palliative care: Secondary | ICD-10-CM

## 2020-06-11 DIAGNOSIS — R52 Pain, unspecified: Secondary | ICD-10-CM

## 2020-06-11 DIAGNOSIS — C7951 Secondary malignant neoplasm of bone: Secondary | ICD-10-CM

## 2020-06-11 LAB — CBC WITH DIFFERENTIAL/PLATELET
Abs Immature Granulocytes: 0.27 10*3/uL — ABNORMAL HIGH (ref 0.00–0.07)
Basophils Absolute: 0 10*3/uL (ref 0.0–0.1)
Basophils Relative: 1 %
Eosinophils Absolute: 0.1 10*3/uL (ref 0.0–0.5)
Eosinophils Relative: 2 %
HCT: 31.4 % — ABNORMAL LOW (ref 36.0–46.0)
Hemoglobin: 10.2 g/dL — ABNORMAL LOW (ref 12.0–15.0)
Immature Granulocytes: 4 %
Lymphocytes Relative: 18 %
Lymphs Abs: 1.1 10*3/uL (ref 0.7–4.0)
MCH: 27.1 pg (ref 26.0–34.0)
MCHC: 32.5 g/dL (ref 30.0–36.0)
MCV: 83.3 fL (ref 80.0–100.0)
Monocytes Absolute: 0.5 10*3/uL (ref 0.1–1.0)
Monocytes Relative: 8 %
Neutro Abs: 4.3 10*3/uL (ref 1.7–7.7)
Neutrophils Relative %: 67 %
Platelets: 431 10*3/uL — ABNORMAL HIGH (ref 150–400)
RBC: 3.77 MIL/uL — ABNORMAL LOW (ref 3.87–5.11)
RDW: 17.7 % — ABNORMAL HIGH (ref 11.5–15.5)
WBC: 6.3 10*3/uL (ref 4.0–10.5)
nRBC: 0 % (ref 0.0–0.2)

## 2020-06-11 LAB — COMPREHENSIVE METABOLIC PANEL
ALT: 17 U/L (ref 0–44)
AST: 22 U/L (ref 15–41)
Albumin: 3.4 g/dL — ABNORMAL LOW (ref 3.5–5.0)
Alkaline Phosphatase: 54 U/L (ref 38–126)
Anion gap: 8 (ref 5–15)
BUN: 9 mg/dL (ref 6–20)
CO2: 24 mmol/L (ref 22–32)
Calcium: 8.4 mg/dL — ABNORMAL LOW (ref 8.9–10.3)
Chloride: 106 mmol/L (ref 98–111)
Creatinine, Ser: 0.4 mg/dL — ABNORMAL LOW (ref 0.44–1.00)
GFR, Estimated: >60 mL/min (ref >60)
Glucose, Bld: 94 mg/dL (ref 70–99)
Potassium: 3.7 mmol/L (ref 3.5–5.1)
Sodium: 138 mmol/L (ref 135–145)
Total Bilirubin: 0.5 mg/dL (ref 0.3–1.2)
Total Protein: 6.5 g/dL (ref 6.5–8.1)

## 2020-06-11 MED ORDER — SODIUM CHLORIDE 0.9 % IV SOLN
Freq: Once | INTRAVENOUS | Status: AC
  Filled 2020-06-11: qty 250

## 2020-06-11 MED ORDER — ACETAMINOPHEN 325 MG PO TABS
650.0000 mg | ORAL_TABLET | Freq: Once | ORAL | Status: AC
  Administered 2020-06-11: 650 mg via ORAL
  Filled 2020-06-11: qty 2

## 2020-06-11 MED ORDER — SODIUM CHLORIDE 0.9 % IV SOLN
65.0000 mg/m2 | Freq: Once | INTRAVENOUS | Status: AC
  Administered 2020-06-11: 108 mg via INTRAVENOUS
  Filled 2020-06-11: qty 18

## 2020-06-11 MED ORDER — DIPHENHYDRAMINE HCL 50 MG/ML IJ SOLN
50.0000 mg | Freq: Once | INTRAMUSCULAR | Status: AC
  Administered 2020-06-11: 50 mg via INTRAVENOUS
  Filled 2020-06-11: qty 1

## 2020-06-11 MED ORDER — HEPARIN SOD (PORK) LOCK FLUSH 100 UNIT/ML IV SOLN
500.0000 [IU] | Freq: Once | INTRAVENOUS | Status: AC
  Administered 2020-06-11: 500 [IU] via INTRAVENOUS
  Filled 2020-06-11: qty 5

## 2020-06-11 MED ORDER — TRASTUZUMAB-ANNS CHEMO 150 MG IV SOLR
6.0000 mg/kg | Freq: Once | INTRAVENOUS | Status: AC
  Administered 2020-06-11: 357 mg via INTRAVENOUS
  Filled 2020-06-11: qty 17

## 2020-06-11 MED ORDER — SODIUM CHLORIDE 0.9 % IV SOLN
20.0000 mg | Freq: Once | INTRAVENOUS | Status: AC
  Administered 2020-06-11: 20 mg via INTRAVENOUS
  Filled 2020-06-11: qty 20

## 2020-06-11 MED ORDER — OXYCODONE HCL 5 MG PO TABS
10.0000 mg | ORAL_TABLET | Freq: Once | ORAL | Status: AC
  Administered 2020-06-11: 10 mg via ORAL
  Filled 2020-06-11: qty 2

## 2020-06-11 MED ORDER — FAMOTIDINE 20 MG IN NS 100 ML IVPB
20.0000 mg | Freq: Once | INTRAVENOUS | Status: AC
  Administered 2020-06-11: 20 mg via INTRAVENOUS
  Filled 2020-06-11: qty 20

## 2020-06-11 MED ORDER — SODIUM CHLORIDE 0.9% FLUSH
10.0000 mL | Freq: Once | INTRAVENOUS | Status: AC
  Administered 2020-06-11: 10 mL via INTRAVENOUS
  Filled 2020-06-11: qty 10

## 2020-06-11 MED ORDER — SODIUM CHLORIDE 0.9 % IV SOLN
420.0000 mg | Freq: Once | INTRAVENOUS | Status: AC
  Administered 2020-06-11: 420 mg via INTRAVENOUS
  Filled 2020-06-11: qty 14

## 2020-06-11 NOTE — Progress Notes (Signed)
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 History obtained with the help of an interpreter.Patient self palpated left breast mass which was followed by a diagnostic bilateral mammogram. Mammogram showed 3.1 x 2.9 x 1.9 cm hypoechoic mass at the 1 o'clock position of the left breast. For abnormal cortically thickened left axillary lymph nodes measuring up to 5 mm. 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 Patient was also having ongoing back pain and was seen by [facility] orthopedics Dr. Doyle Askew who ordered MRI lumbar spine without contrast which showed possible pathologic fractures of L1 and L4 vertebral bodies with greater than 50% height loss at L1 and abnormal signal involving L2-L3 S1 as well as right iliac bone concerning for metastatic disease.Patient is a single mother of 3 adult children and is very anxious today.She reports significant back pain which radiates to her bilateral thighs. Denies any focal tingling numbness or weakness. Denies any bowel bladder incontinence. Pain has been uncontrolled despite taking Tylenol. No prior history of abnormal breast biopsies. No family  history of breast cancer  PET and MRIshowed 3 areas of pathologic fracture of her spine as well as widespread bony metastatic disease and concern for impending fracture of the right hip. Given her worsening pain she was asked to come to the ER. She has been evaluated by Dr. Rudene Christians from orthopedic surgery and underwent kyphoplasty at 3 different levels.T6 L1 and L4 along with radiofrequency ablation.She also underwent prophylactic fixation of the right hip and not affected the sacral region.   Patient received first dose of Herceptin and Perjeta on 09/04/2019. Baseline echocardiogram normal.she is currently getting taxol/ herceptin/perjeta  Patient admitted to hospital for acute abdominal pain with CT findings concerning for acute colitis. Colonoscopy showed diffuse severe inflammation with erythema friability and loss of vascularity and shallow ulcerations in the IC valve, ascending colon and cecum.   Interval history-patient reports pain in her right hip is getting better but she feels that her right leg is still swollen.  He reports pain in her mid back.  Diarrhea has resolved and bowel movements are now regular.  She will be completing her vancomycin course today.  ECOG PS- 1 Pain scale- 3 Opioid associated constipation- no  Review of systems- Review of Systems  Constitutional: Positive for malaise/fatigue. Negative for chills, fever and weight loss.  HENT: Negative for congestion, ear discharge and nosebleeds.   Eyes: Negative for blurred vision.  Respiratory: Negative for cough, hemoptysis, sputum production, shortness of breath and wheezing.   Cardiovascular: Negative for chest pain, palpitations, orthopnea and claudication.  Gastrointestinal: Negative for abdominal pain, blood in stool, constipation, diarrhea, heartburn, melena, nausea and vomiting.  Genitourinary: Negative for dysuria, flank pain, frequency, hematuria and urgency.  Musculoskeletal:  Positive for back pain.  Negative for joint pain and myalgias.       Right leg pain  Skin: Negative for rash.  Neurological: Negative for dizziness, tingling, focal weakness, seizures, weakness and headaches.  Endo/Heme/Allergies: Does not bruise/bleed easily.  Psychiatric/Behavioral: Negative for depression and suicidal ideas. The patient does not have insomnia.       No Known Allergies   Past Medical History:  Diagnosis Date  . Anxiety   . Breast cancer ([place])    with mets  . Cancer ([place])   . Colitis   . Family history of colon cancer   . Vertigo      Past Surgical History:  Procedure Laterality Date  . BREAST BIOPSY Left 08/14/2019   [place] bx of mass, coil marker, path pending  . BREAST BIOPSY Left 08/14/2019   [place] bx of LN, hydromarker, path pending  . BREAST BIOPSY Left 08/14/2019   affirm bx of calcs, x marker, path pending  . ESOPHAGOGASTRODUODENOSCOPY (EGD) WITH PROPOFOL N/A 10/05/2019   Procedure: ESOPHAGOGASTRODUODENOSCOPY (EGD) WITH PROPOFOL;  Surgeon: Lin Landsman, MD;  Location: [facility];  Service: Gastroenterology;  Laterality: N/A;  . FLEXIBLE SIGMOIDOSCOPY N/A 10/05/2019   Procedure: FLEXIBLE SIGMOIDOSCOPY;  Surgeon: Lin Landsman, MD;  Location[facility] ENDOSCOPY;  Service: Gastroenterology;  Laterality: N/A;  . INTRAMEDULLARY (IM) NAIL INTERTROCHANTERIC Right 09/01/2019   Procedure: INTRAMEDULLARY (IM) NAIL INTERTROCHANTRIC AND RADIOFREQUENCY ABLATION;  Surgeon: Hessie Knows, MD;  Location: [facility] ORS;  Service: Orthopedics;  Laterality: Right;  . KYPHOPLASTY N/A 08/29/2019   Procedure: KYPHOPLASTY T6, L1,L4 ,  RADIOFREQUENCY ABLATION;  Surgeon: Hessie Knows, MD;  Location: [facility] ORS;  Service: Orthopedics;  Laterality: N/A;  . KYPHOPLASTY Right 09/01/2019   Procedure: Right Sacral Radiofrequency Ablation and Cement Augmentation, Right sacrum and iliac crest;  Surgeon: Hessie Knows, MD;  Location: [facility] ORS;  Service: Orthopedics;  Laterality: Right;  . PORTA CATH INSERTION  N/A 08/28/2019   Procedure: PORTA CATH INSERTION;  Surgeon: Algernon Huxley, MD;  Location: [facility];  Service: Cardiovascular;  Laterality: N/A;    Social History   Socioeconomic History  . Marital status: Single    Spouse name: Not on file  . Number of children: Not on file  . Years of education: Not on file  . Highest education level: Not on file  Occupational History  . Not on file  Tobacco Use  . Smoking status: Never Smoker  . Smokeless tobacco: Never Used  Vaping Use  . Vaping Use: Never used  Substance and Sexual Activity  . Alcohol use: Not Currently  . Drug use: Not Currently  . Sexual activity: Not Currently    Birth control/protection: None  Other Topics Concern  . Not on file  Social History Narrative   Lives at home with children   Social Determinants of Health   Financial Resource Strain: Not on file  Food Insecurity: Not on file  Transportation Needs: Not on file  Physical Activity: Not on file  Stress: Not on file  Social Connections: Not on file  Intimate Partner Violence: Not on file    Family History  Problem Relation Age of Onset  . Colon cancer Maternal Uncle      Current Outpatient Medications:  .  clindamycin (CLINDAGEL) 1 % gel, Apply topically 2 (two) times daily. X 7 days., Disp: 30 g, Rfl: 0 .  FLUoxetine (PROZAC) 10 MG capsule, Take 1 capsule (10 mg total) by mouth daily., Disp: 30 capsule, Rfl: 3 .  gabapentin (NEURONTIN) 300 MG capsule, Take 1 capsule (300 mg total) by mouth 3 (three) times daily., Disp: 180 capsule, Rfl: 0 .  lidocaine-prilocaine (EMLA) cream, Apply 1 application topically as needed. Apply small amount to port site at least 1 hour prior to it being accessed, cover with plastic wrap, Disp: 30 g, Rfl: 1 .  oxycodone (OXY-IR) 5 MG capsule, Take 1-2 capsules (5-10 mg total) by mouth every 4 (four) hours as needed., Disp: 90 capsule, Rfl: 0 .  oxyCODONE (OXYCONTIN) 10 mg 12 hr tablet, Take 1 tablet (10 mg total)  by mouth every 12 (twelve) hours., Disp: 60 tablet, Rfl: 0 .  potassium chloride SA (KLOR-CON) 20 MEQ tablet, Take 1 tablet (20 mEq total) by mouth 2 (two) times daily., Disp: 14 tablet, Rfl: 0 .  vancomycin (VANCOCIN) 125 MG capsule, Take 1 capsule (125 mg total) by mouth 4 (four) times daily for 10 days. For C Diff infection, Disp: 40 capsule, Rfl: 0 .  diphenoxylate-atropine (LOMOTIL) 2.5-0.025 MG tablet, Take 1 tablet by mouth 4 (four) times daily as needed for diarrhea or loose stools. (Patient not taking: Reported on 06/11/2020), Disp: 40 tablet, Rfl: 0 No current facility-administered medications for this visit.  Facility-Administered Medications Ordered in Other Visits:  .  0.9 %  sodium chloride infusion, , Intravenous, Continuous, Burns, Wandra Feinstein, NP, Stopped at 06/02/20 1453 .  heparin lock flush 100 unit/mL, 500 Units, Intravenous, Once, Sindy Guadeloupe, MD .  PACLitaxel (TAXOL) 108 mg in sodium chloride 0.9 % 250 mL chemo infusion (</= $RemoveBefor'80mg'seYzGqyysQTN$ /m2), 65 mg/m2 (Treatment Plan Recorded), Intravenous, Once, Sindy Guadeloupe, MD, Last Rate: 268 mL/hr at 06/11/20 1246, 108 mg at 06/11/20 1246  Physical exam:  Vitals:   06/11/20 0950 06/11/20 1009  BP:  112/87  Pulse:  72  Temp:  99.9 F (37.7 C)  TempSrc:  Tympanic  Weight: 135 lb (61.2 kg) 135 lb 8 oz (61.5 kg)   Physical Exam Constitutional:      General: She is not in acute distress. Cardiovascular:     Rate and Rhythm: Normal rate.  Pulmonary:     Effort: Pulmonary effort is normal.  Skin:    General: Skin is warm and dry.  Neurological:     Mental Status: She is alert and oriented to person, place, and time.      CMP Latest Ref Rng & Units 06/11/2020  Glucose 70 - 99 mg/dL 94  BUN 6 - 20 mg/dL 9  Creatinine 0.44 - 1.00 mg/dL 0.40(L)  Sodium 135 - 145 mmol/L 138  Potassium 3.5 - 5.1 mmol/L 3.7  Chloride 98 - 111 mmol/L 106  CO2 22 - 32 mmol/L 24  Calcium 8.9 - 10.3 mg/dL 8.4(L)  Total Protein 6.5 - 8.1 g/dL 6.5  Total  Bilirubin 0.3 - 1.2 mg/dL 0.5  Alkaline Phos 38 - 126 U/L 54  AST 15 - 41 U/L 22  ALT 0 - 44 U/L 17   CBC Latest Ref Rng & Units 06/11/2020  WBC 4.0 - 10.5 K/uL 6.3  Hemoglobin 12.0 - 15.0 g/dL 10.2(L)  Hematocrit 36.0 - 46.0 % 31.4(L)  Platelets 150 - 400 K/uL 431(H)    No images are attached to the encounter.  DG Chest 2 View  Result Date: 06/02/2020 CLINICAL DATA:  Fever.  History of metastatic breast carcinoma EXAM: CHEST - 2 VIEW COMPARISON:  October 01, 2019 chest radiograph; chest CT April 01, 2020 FINDINGS: There is slight scarring in the medial left upper lobe.  The lungs elsewhere are clear. Heart size and pulmonary vascular normal. No adenopathy. Port-A-Cath tip at cavoatrial junction. No pneumothorax. Patient has had previous kyphoplasty procedures at T7, L2, and L5. No blastic or lytic bone lesions evident by radiography. IMPRESSION: Mild scarring left upper lobe. Lungs otherwise clear. Heart size normal. No adenopathy. Previous kyphoplasty procedures at several levels. Electronically Signed   By: Lowella Grip III M.D.   On: 06/02/2020 11:23   CT Abdomen Pelvis W Contrast  Result Date: 06/03/2020 CLINICAL DATA:  Concern for gastroenteritis. Abdominal pain. History of breast cancer. Chemotherapy ongoing. EXAM: CT ABDOMEN AND PELVIS WITH CONTRAST TECHNIQUE: Multidetector CT imaging of the abdomen and pelvis was performed using the standard protocol following bolus administration of intravenous contrast. CONTRAST:  125mL OMNIPAQUE IOHEXOL 300 MG/ML  SOLN COMPARISON:  CT 04/01/2020 FINDINGS: Lower chest: Lung bases are clear. Hepatobiliary: No focal hepatic lesion. No biliary duct dilatation. Common bile duct is normal. Pancreas: Pancreas is normal. No ductal dilatation. No pancreatic inflammation. Spleen: Normal spleen Adrenals/urinary tract: Adrenal glands and kidneys are normal. The ureters and bladder normal. Stomach/Bowel: Stomach, duodenum small-bowel normal. No evidence of small  bowel inflammation or obstruction. Terminal ileum is normal. Appendix is normal. The entire colon is collapsed. Sigmoid colon and rectum are collapsed. No clear evidence of colonic inflammation. No mesenteric inflammation. Vascular/Lymphatic: Abdominal aorta is normal caliber. No periportal or retroperitoneal adenopathy. No pelvic adenopathy. Reproductive: Uterus and adnexa unremarkable. Other: No free fluid.  No peritoneal disease Musculoskeletal: No aggressive osseous lesion. Chronic compression fracture of the lumbar spine with augmentation. Round sclerotic lesions at T12 and T10 are not changed from comparison CT. Additional smaller lesions in the lumbar spine. The lesions are not hypermetabolic on comparison PET-CT scan 12/16/2019. Sacral plasty in the RIGHT sacral ala. IMPRESSION: 1. No evidence of inflammation or obstruction the small bowel or colon. 2. Entire colon is collapsed without stool. 3. Gallbladder appendix are normal. 4. Round sclerotic skeletal lesions again noted. No interval change. Electronically Signed   By: Suzy Bouchard M.D.   On: 06/03/2020 11:30   DG HIP UNILAT WITH PELVIS 2-3 VIEWS RIGHT  Result Date: 06/07/2020 CLINICAL DATA:  Fall last Thursday with posterolateral right hip pain. Metastatic breast cancer. EXAM: DG HIP (WITH OR WITHOUT PELVIS) 2-3V RIGHT COMPARISON:  08/28/2019 right hip radiographs FINDINGS: Intramedullary rod in the right femoral shaft with interlocking right femoral neck pin and distal interlocking screw with no evidence of hardware fracture or loosening. Linear cortical discontinuity in the anterior right proximal femoral metaphysis on the lateral view, cannot exclude a small nondisplaced periprosthetic fracture. No right hip dislocation. Otherwise no potential acute pelvic fracture. Dense material in the upper right sacrum and medial right iliac bone. Sclerotic lesion in the supra-acetabular peripheral left iliac bone as seen on recent CT abdomen/pelvis  study. IMPRESSION: 1. Linear cortical discontinuity in the anterior right proximal femoral metaphysis on the lateral view, cannot exclude a small nondisplaced periprosthetic fracture. Right hip CT may be obtained for further evaluation as clinically warranted. 2. Sclerotic lesion in supra-acetabular left iliac bone as seen on recent CT abdomen study, compatible with metastasis. Electronically Signed   By: Ilona Sorrel M.D.   On: 06/07/2020 15:09     Assessment and plan- Patient is a 40 y.o. female with metastatic ER/PR negative HER-2 positive breast cancer with bone metastases.   She is here for on treatment assessment prior to cycle 14-day 1 of Taxol Herceptin and Perjeta  Counts okay to proceed  with cycle 14-day 1 of Taxol Herceptin and Perjeta today.  She will directly proceed for single agent Taxol next week and I will see her back in 3 weeks for cycle 15.  She will be getting Taxol 2 weeks on and 1 week off.  If there is persistent worsening of her neuropathy I will discontinue Taxol altogether.  Patient had a fall a few days ago.X-ray of the right hip shows bleeding near discontinue it in the anterior right proximal meta physis.  Small nondisplaced fracture was not excluded.  I have discussed these findings with Dr. Rudene Christians who has previously operated on her right hip.  He recommends conservative management at this time with repeat x-rays in 4 to 6 weeks which we will schedule.  Based on my exam today she has trace bilateral edema but no concerning clinical findings of DVT.  We will hold off on further imaging at this time  Patient recently had a CT abdomen and pelvis with contrast which overall showed stable diseaseI will also get a bone scan at this time given her ongoing back pain.  C. difficile colitis: Resolved after p.o. vancomycin     Visit Diagnosis 1. Metastatic breast cancer ([place])   2. Bone metastases ([place])      Dr. Randa Evens, MD, MPH [facility] [phone] 06/11/2020 1:17 PM

## 2020-06-11 NOTE — Progress Notes (Signed)
Pt has bene sick and diarrhea and had a fall  And right leg swelling could not take meds until Saturday. She started back on pain. She ran out of pain med and did not know if pain med was sent to pharmacy.  She says that her right leg is still swollen. No diarrhea now. Low grade fever

## 2020-06-11 NOTE — Progress Notes (Signed)
[place]  [phone] [phone]   Name: Sophia [name] Date: 06/11/2020 MRN: [id number]  DOB: 25-Jan-1981  Patient Care Team: [facility], [place], NP as PCP - General Rico Junker, RN as Registered Nurse Theodore Demark, RN as Registered Nurse Sindy Guadeloupe, MD as Consulting Physician (Hematology and Oncology)    REASON FOR CONSULTATION: Sophia [name] is a 40 y.o. 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  Patient has history of pathologic fracture to spine status post kyphoplasty.  She also underwent prophylactic fixation of the right hip due to impending fracture.  She has had significant depression and anxiety.  She was referred to palliative care to help address goals and manage ongoing symptoms.  SOCIAL HISTORY:     reports that she has never smoked. She has never used smokeless tobacco. She reports previous alcohol use. She reports previous drug use.  Patient is from [place].  She speaks [place].  She lives here with her 3 children ages 47-15.  She has no family or friend/social support locally.  She is unable to work.  ADVANCE DIRECTIVES:  Does not have  CODE STATUS:   PAST MEDICAL HISTORY: Past Medical History:  Diagnosis Date  . Anxiety   . Breast cancer ([place])    with mets  . Cancer ([employer])   . Colitis   . Family history of colon cancer   . Vertigo     PAST SURGICAL HISTORY:  Past Surgical History:  Procedure Laterality Date  . BREAST BIOPSY Left 08/14/2019   [place] bx of mass, coil marker, path pending  . BREAST BIOPSY Left 08/14/2019   [place] bx of LN, hydromarker, path pending  . BREAST BIOPSY Left 08/14/2019   affirm bx of calcs, x marker, path pending  . ESOPHAGOGASTRODUODENOSCOPY (EGD) WITH PROPOFOL N/A 10/05/2019   Procedure: ESOPHAGOGASTRODUODENOSCOPY (EGD) WITH PROPOFOL;  Surgeon:  Lin Landsman, MD;  Location: [place];  Service: Gastroenterology;  Laterality: N/A;  . FLEXIBLE SIGMOIDOSCOPY N/A 10/05/2019   Procedure: FLEXIBLE SIGMOIDOSCOPY;  Surgeon: Lin Landsman, MD;  Location[facility] ENDOSCOPY;  Service: Gastroenterology;  Laterality: N/A;  . INTRAMEDULLARY (IM) NAIL INTERTROCHANTERIC Right 09/01/2019   Procedure: INTRAMEDULLARY (IM) NAIL INTERTROCHANTRIC AND RADIOFREQUENCY ABLATION;  Surgeon: Hessie Knows, MD;  Location: [facility] ORS;  Service: Orthopedics;  Laterality: Right;  . KYPHOPLASTY N/A 08/29/2019   Procedure: KYPHOPLASTY T6, L1,L4 ,  RADIOFREQUENCY ABLATION;  Surgeon: Hessie Knows, MD;  Location: [facility] ORS;  Service: Orthopedics;  Laterality: N/A;  . KYPHOPLASTY Right 09/01/2019   Procedure: Right Sacral Radiofrequency Ablation and Cement Augmentation, Right sacrum and iliac crest;  Surgeon: Hessie Knows, MD;  Location: [facility] ORS;  Service: Orthopedics;  Laterality: Right;  . PORTA CATH INSERTION N/A 08/28/2019   Procedure: PORTA CATH INSERTION;  Surgeon: Algernon Huxley, MD;  Location: [facility];  Service: Cardiovascular;  Laterality: N/A;    HEMATOLOGY/ONCOLOGY HISTORY:  Oncology History  Metastatic breast cancer ([place])  08/29/2019 Initial Diagnosis   Metastatic breast cancer ([place])   09/04/2019 -  Chemotherapy    Patient is on Treatment Plan: BREAST WEEKLY PACLITAXEL + TRASTUZUMAB + PERTUZUMAB Q21D       Genetic Testing   Negative genetic testing. No pathogenic variants identified on the [facility] CancerNext-Expanded+RNA panel. The report date is 04/13/2020.   The CancerNext-Expanded + RNAinsight gene panel offered by [facility] and includes sequencing and rearrangement  analysis for the following 77 genes: IP, ALK, APC*, ATM*, AXIN2, BAP1, BARD1, BLM, BMPR1A, BRCA1*, BRCA2*, BRIP1*, CDC73, CDH1*,CDK4, CDKN1B, CDKN2A, CHEK2*, CTNNA1, DICER1, FANCC, FH, FLCN, GALNT12, KIF1B, LZTR1, MAX, MEN1, MET, MLH1*, MSH2*, MSH3, MSH6*, MUTYH*, NBN,  NF1*, NF2, NTHL1, PALB2*, PHOX2B, PMS2*, POT1, PRKAR1A, PTCH1, PTEN*, RAD51C*, RAD51D*,RB1, RECQL, RET, SDHA, SDHAF2, SDHB, SDHC, SDHD, SMAD4, SMARCA4, SMARCB1, SMARCE1, STK11, SUFU, TMEM127, TP53*,TSC1, TSC2, VHL and XRCC2 (sequencing and deletion/duplication); EGFR, EGLN1, HOXB13, KIT, MITF, PDGFRA, POLD1 and POLE (sequencing only); EPCAM and GREM1 (deletion/duplication only). DNA and RNA analyses performed for * genes.     ALLERGIES:  has No Known Allergies.  MEDICATIONS:  Current Outpatient Medications  Medication Sig Dispense Refill  . clindamycin (CLINDAGEL) 1 % gel Apply topically 2 (two) times daily. X 7 days. 30 g 0  . diphenoxylate-atropine (LOMOTIL) 2.5-0.025 MG tablet Take 1 tablet by mouth 4 (four) times daily as needed for diarrhea or loose stools. (Patient not taking: Reported on 06/11/2020) 40 tablet 0  . FLUoxetine (PROZAC) 10 MG capsule Take 1 capsule (10 mg total) by mouth daily. 30 capsule 3  . gabapentin (NEURONTIN) 300 MG capsule Take 1 capsule (300 mg total) by mouth 3 (three) times daily. 180 capsule 0  . lidocaine-prilocaine (EMLA) cream Apply 1 application topically as needed. Apply small amount to port site at least 1 hour prior to it being accessed, cover with plastic wrap 30 g 1  . oxycodone (OXY-IR) 5 MG capsule Take 1-2 capsules (5-10 mg total) by mouth every 4 (four) hours as needed. 90 capsule 0  . oxyCODONE (OXYCONTIN) 10 mg 12 hr tablet Take 1 tablet (10 mg total) by mouth every 12 (twelve) hours. 60 tablet 0  . potassium chloride SA (KLOR-CON) 20 MEQ tablet Take 1 tablet (20 mEq total) by mouth 2 (two) times daily. 14 tablet 0  . vancomycin (VANCOCIN) 125 MG capsule Take 1 capsule (125 mg total) by mouth 4 (four) times daily for 10 days. For C Diff infection 40 capsule 0   No current facility-administered medications for this visit.   Facility-Administered Medications Ordered in Other Visits  Medication Dose Route Frequency Provider Last Rate Last Admin  .  0.9 %  sodium chloride infusion   Intravenous Continuous Jacquelin Hawking, NP   Stopped at 06/02/20 1453  . heparin lock flush 100 unit/mL  500 Units Intravenous Once Sindy Guadeloupe, MD      . PACLitaxel (TAXOL) 108 mg in sodium chloride 0.9 % 250 mL chemo infusion (</= $RemoveBefor'80mg'OXxofsZsiBYJ$ /m2)  65 mg/m2 (Treatment Plan Recorded) Intravenous Once Sindy Guadeloupe, MD 268 mL/hr at 06/11/20 1246 108 mg at 06/11/20 1246    VITAL SIGNS: There were no vitals taken for this visit. There were no vitals filed for this visit.  Estimated body mass index is 26.46 kg/m as calculated from the following:   Height as of 05/28/20: 5' (1.524 m).   Weight as of an earlier encounter on 06/11/20: 135 lb 8 oz (61.5 kg).  LABS: CBC:    Component Value Date/Time   WBC 6.3 06/11/2020 0920   HGB 10.2 (L) 06/11/2020 0920   HCT 31.4 (L) 06/11/2020 0920   PLT 431 (H) 06/11/2020 0920   MCV 83.3 06/11/2020 0920   NEUTROABS 4.3 06/11/2020 0920   LYMPHSABS 1.1 06/11/2020 0920   MONOABS 0.5 06/11/2020 0920   EOSABS 0.1 06/11/2020 0920   BASOSABS 0.0 06/11/2020 0920   Comprehensive Metabolic Panel:    Component Value Date/Time   NA  138 06/11/2020 0920   K 3.7 06/11/2020 0920   CL 106 06/11/2020 0920   CO2 24 06/11/2020 0920   BUN 9 06/11/2020 0920   CREATININE 0.40 (L) 06/11/2020 0920   GLUCOSE 94 06/11/2020 0920   CALCIUM 8.4 (L) 06/11/2020 0920   AST 22 06/11/2020 0920   ALT 17 06/11/2020 0920   ALKPHOS 54 06/11/2020 0920   BILITOT 0.5 06/11/2020 0920   PROT 6.5 06/11/2020 0920   ALBUMIN 3.4 (L) 06/11/2020 0920    RADIOGRAPHIC STUDIES: DG Chest 2 View  Result Date: 06/02/2020 CLINICAL DATA:  Fever.  History of metastatic breast carcinoma EXAM: CHEST - 2 VIEW COMPARISON:  October 01, 2019 chest radiograph; chest CT April 01, 2020 FINDINGS: There is slight scarring in the medial left upper lobe. The lungs elsewhere are clear. Heart size and pulmonary vascular normal. No adenopathy. Port-A-Cath tip at cavoatrial  junction. No pneumothorax. Patient has had previous kyphoplasty procedures at T7, L2, and L5. No blastic or lytic bone lesions evident by radiography. IMPRESSION: Mild scarring left upper lobe. Lungs otherwise clear. Heart size normal. No adenopathy. Previous kyphoplasty procedures at several levels. Electronically Signed   By: Bretta Bang III M.D.   On: 06/02/2020 11:23   CT Abdomen Pelvis W Contrast  Result Date: 06/03/2020 CLINICAL DATA:  Concern for gastroenteritis. Abdominal pain. History of breast cancer. Chemotherapy ongoing. EXAM: CT ABDOMEN AND PELVIS WITH CONTRAST TECHNIQUE: Multidetector CT imaging of the abdomen and pelvis was performed using the standard protocol following bolus administration of intravenous contrast. CONTRAST:  OMNIPAQUE IOHEXOL 300 MG/ML  SOLN COMPARISON:  CT 04/01/2020 FINDINGS: Lower chest: Lung bases are clear. Hepatobiliary: No focal hepatic lesion. No biliary duct dilatation. Common bile duct is normal. Pancreas: Pancreas is normal. No ductal dilatation. No pancreatic inflammation. Spleen: Normal spleen Adrenals/urinary tract: Adrenal glands and kidneys are normal. The ureters and bladder normal. Stomach/Bowel: Stomach, duodenum small-bowel normal. No evidence of small bowel inflammation or obstruction. Terminal ileum is normal. Appendix is normal. The entire colon is collapsed. Sigmoid colon and rectum are collapsed. No clear evidence of colonic inflammation. No mesenteric inflammation. Vascular/Lymphatic: Abdominal aorta is normal caliber. No periportal or retroperitoneal adenopathy. No pelvic adenopathy. Reproductive: Uterus and adnexa unremarkable. Other: No free fluid.  No peritoneal disease Musculoskeletal: No aggressive osseous lesion. Chronic compression fracture of the lumbar spine with augmentation. Round sclerotic lesions at T12 and T10 are not changed from comparison CT. Additional smaller lesions in the lumbar spine. The lesions are not hypermetabolic  on comparison PET-CT scan 12/16/2019. Sacral plasty in the RIGHT sacral ala. IMPRESSION: 1. No evidence of inflammation or obstruction the small bowel or colon. 2. Entire colon is collapsed without stool. 3. Gallbladder appendix are normal. 4. Round sclerotic skeletal lesions again noted. No interval change. Electronically Signed   By: Genevive Bi M.D.   On: 06/03/2020 11:30   DG HIP UNILAT WITH PELVIS 2-3 VIEWS RIGHT  Result Date: 06/07/2020 CLINICAL DATA:  Fall last Thursday with posterolateral right hip pain. Metastatic breast cancer. EXAM: DG HIP (WITH OR WITHOUT PELVIS) 2-3V RIGHT COMPARISON:  08/28/2019 right hip radiographs FINDINGS: Intramedullary rod in the right femoral shaft with interlocking right femoral neck pin and distal interlocking screw with no evidence of hardware fracture or loosening. Linear cortical discontinuity in the anterior right proximal femoral metaphysis on the lateral view, cannot exclude a small nondisplaced periprosthetic fracture. No right hip dislocation. Otherwise no potential acute pelvic fracture. Dense material in the upper right sacrum and  medial right iliac bone. Sclerotic lesion in the supra-acetabular peripheral left iliac bone as seen on recent CT abdomen/pelvis study. IMPRESSION: 1. Linear cortical discontinuity in the anterior right proximal femoral metaphysis on the lateral view, cannot exclude a small nondisplaced periprosthetic fracture. Right hip CT may be obtained for further evaluation as clinically warranted. 2. Sclerotic lesion in supra-acetabular left iliac bone as seen on recent CT abdomen study, compatible with metastasis. Electronically Signed   By: Ilona Sorrel M.D.   On: 06/07/2020 15:09    PERFORMANCE STATUS (ECOG) : 1 - Symptomatic but completely ambulatory  Review of Systems Unless otherwise noted, a complete review of systems is negative.  Physical Exam General: NAD Pulmonary: Unlabored Extremities: no edema, no joint  deformities Skin: no rashes Neurological: Weakness but otherwise nonfocal  IMPRESSION: I met with patient in the infusion area.  Patient had recent C. difficile colitis now resolved.  She reports worsened moods recently with increased episodes of tearfulness and depression.  However, patient states that she had discontinued her fluoxetine some weeks back.  I suggested that she restart that and she was in agreement.  She understands that it can take some weeks to regain effectiveness.  Patient reports that one of her sons is having emotional difficulty coping with her illness.  I gave her information about self-referral to [facility].   PLAN: -Continue current scope of treatment -Restart Prozac 10 mg daily -Continue trazodone and gabapentin -Referral to [place] visit in 1-2 months   Patient expressed understanding and was in agreement with this plan. She also understands that She can call the clinic at any time with any questions, concerns, or complaints.     Time Total: 15 minutes  Visit consisted of counseling and education dealing with the complex and emotionally intense issues of symptom management and palliative care in the setting of serious and potentially life-threatening illness.Greater than 50%  of this time was spent counseling and coordinating care related to the above assessment and plan.  Signed by: Altha Harm, PhD, NP-C

## 2020-06-17 ENCOUNTER — Other Ambulatory Visit: Payer: Self-pay | Admitting: *Deleted

## 2020-06-17 MED ORDER — OXYCODONE HCL ER 10 MG PO T12A
10.0000 mg | EXTENDED_RELEASE_TABLET | Freq: Two times a day (BID) | ORAL | 0 refills | Status: DC
Start: 2020-06-17 — End: 2020-07-19

## 2020-06-17 MED ORDER — GABAPENTIN 300 MG PO CAPS: 300.0000 mg | ORAL_CAPSULE | Freq: Three times a day (TID) | ORAL | 3 refills | Status: DC

## 2020-06-18 ENCOUNTER — Inpatient Hospital Stay: Payer: Self-pay | Attending: Hospice and Palliative Medicine

## 2020-06-18 ENCOUNTER — Inpatient Hospital Stay (HOSPITAL_BASED_OUTPATIENT_CLINIC_OR_DEPARTMENT_OTHER): Payer: Self-pay | Admitting: Nurse Practitioner

## 2020-06-18 ENCOUNTER — Inpatient Hospital Stay: Payer: Self-pay

## 2020-06-18 ENCOUNTER — Other Ambulatory Visit: Payer: Self-pay

## 2020-06-18 VITALS — BP 96/61 | HR 90 | Temp 100.9°F

## 2020-06-18 DIAGNOSIS — Z171 Estrogen receptor negative status [ER-]: Secondary | ICD-10-CM | POA: Insufficient documentation

## 2020-06-18 DIAGNOSIS — A498 Other bacterial infections of unspecified site: Secondary | ICD-10-CM

## 2020-06-18 DIAGNOSIS — Z95828 Presence of other vascular implants and grafts: Secondary | ICD-10-CM

## 2020-06-18 DIAGNOSIS — Z79899 Other long term (current) drug therapy: Secondary | ICD-10-CM | POA: Insufficient documentation

## 2020-06-18 DIAGNOSIS — R1110 Vomiting, unspecified: Secondary | ICD-10-CM | POA: Insufficient documentation

## 2020-06-18 DIAGNOSIS — C7951 Secondary malignant neoplasm of bone: Secondary | ICD-10-CM | POA: Insufficient documentation

## 2020-06-18 DIAGNOSIS — A0472 Enterocolitis due to Clostridium difficile, not specified as recurrent: Secondary | ICD-10-CM

## 2020-06-18 DIAGNOSIS — Z5112 Encounter for antineoplastic immunotherapy: Secondary | ICD-10-CM | POA: Insufficient documentation

## 2020-06-18 DIAGNOSIS — Z800 Family history of malignant neoplasm of digestive organs: Secondary | ICD-10-CM | POA: Insufficient documentation

## 2020-06-18 DIAGNOSIS — Z5111 Encounter for antineoplastic chemotherapy: Secondary | ICD-10-CM | POA: Insufficient documentation

## 2020-06-18 DIAGNOSIS — C50919 Malignant neoplasm of unspecified site of unspecified female breast: Secondary | ICD-10-CM

## 2020-06-18 DIAGNOSIS — C50912 Malignant neoplasm of unspecified site of left female breast: Secondary | ICD-10-CM | POA: Insufficient documentation

## 2020-06-18 DIAGNOSIS — G893 Neoplasm related pain (acute) (chronic): Secondary | ICD-10-CM | POA: Insufficient documentation

## 2020-06-18 LAB — COMPREHENSIVE METABOLIC PANEL
ALT: 14 U/L (ref 0–44)
AST: 28 U/L (ref 15–41)
Albumin: 3.5 g/dL (ref 3.5–5.0)
Alkaline Phosphatase: 60 U/L (ref 38–126)
Anion gap: 8 (ref 5–15)
BUN: 6 mg/dL (ref 6–20)
CO2: 25 mmol/L (ref 22–32)
Calcium: 8.4 mg/dL — ABNORMAL LOW (ref 8.9–10.3)
Chloride: 103 mmol/L (ref 98–111)
Creatinine, Ser: 0.49 mg/dL (ref 0.44–1.00)
GFR, Estimated: >60 mL/min (ref >60)
Glucose, Bld: 114 mg/dL — ABNORMAL HIGH (ref 70–99)
Potassium: 3.5 mmol/L (ref 3.5–5.1)
Sodium: 136 mmol/L (ref 135–145)
Total Bilirubin: 1 mg/dL (ref 0.3–1.2)
Total Protein: 6.7 g/dL (ref 6.5–8.1)

## 2020-06-18 LAB — CBC WITH DIFFERENTIAL/PLATELET
Abs Immature Granulocytes: 0.07 10*3/uL (ref 0.00–0.07)
Basophils Absolute: 0.1 10*3/uL (ref 0.0–0.1)
Basophils Relative: 0 %
Eosinophils Absolute: 0.1 10*3/uL (ref 0.0–0.5)
Eosinophils Relative: 1 %
HCT: 32.2 % — ABNORMAL LOW (ref 36.0–46.0)
Hemoglobin: 10.5 g/dL — ABNORMAL LOW (ref 12.0–15.0)
Immature Granulocytes: 1 %
Lymphocytes Relative: 10 %
Lymphs Abs: 1.3 10*3/uL (ref 0.7–4.0)
MCH: 27.2 pg (ref 26.0–34.0)
MCHC: 32.6 g/dL (ref 30.0–36.0)
MCV: 83.4 fL (ref 80.0–100.0)
Monocytes Absolute: 0.8 10*3/uL (ref 0.1–1.0)
Monocytes Relative: 6 %
Neutro Abs: 9.7 10*3/uL — ABNORMAL HIGH (ref 1.7–7.7)
Neutrophils Relative %: 82 %
Platelets: 455 10*3/uL — ABNORMAL HIGH (ref 150–400)
RBC: 3.86 MIL/uL — ABNORMAL LOW (ref 3.87–5.11)
RDW: 17.6 % — ABNORMAL HIGH (ref 11.5–15.5)
WBC: 12 10*3/uL — ABNORMAL HIGH (ref 4.0–10.5)
nRBC: 0 % (ref 0.0–0.2)

## 2020-06-18 MED ORDER — HEPARIN SOD (PORK) LOCK FLUSH 100 UNIT/ML IV SOLN
500.0000 [IU] | Freq: Once | INTRAVENOUS | Status: AC
  Administered 2020-06-18: 500 [IU] via INTRAVENOUS
  Filled 2020-06-18: qty 5

## 2020-06-18 MED ORDER — ACETAMINOPHEN 500 MG PO TABS
ORAL_TABLET | ORAL | Status: AC
  Filled 2020-06-18: qty 2

## 2020-06-18 MED ORDER — VANCOMYCIN HCL 125 MG PO CAPS
125.0000 mg | ORAL_CAPSULE | Freq: Four times a day (QID) | ORAL | 0 refills | Status: DC
Start: 2020-06-18 — End: 2020-06-28

## 2020-06-18 MED ORDER — HEPARIN SOD (PORK) LOCK FLUSH 100 UNIT/ML IV SOLN
INTRAVENOUS | Status: AC
  Filled 2020-06-18: qty 5

## 2020-06-18 MED ORDER — SODIUM CHLORIDE 0.9 % IV SOLN
Freq: Once | INTRAVENOUS | Status: AC
  Filled 2020-06-18: qty 250

## 2020-06-18 MED ORDER — SODIUM CHLORIDE 0.9% FLUSH
10.0000 mL | Freq: Once | INTRAVENOUS | Status: AC
  Administered 2020-06-18: 10 mL via INTRAVENOUS
  Filled 2020-06-18: qty 10

## 2020-06-18 MED ORDER — ACETAMINOPHEN 500 MG PO TABS
1000.0000 mg | ORAL_TABLET | Freq: Once | ORAL | Status: AC
  Administered 2020-06-18: 1000 mg via ORAL

## 2020-06-18 NOTE — Progress Notes (Signed)
See [facility] note

## 2020-06-18 NOTE — Progress Notes (Signed)
Symptom Management [place]  [phone] [phone]  Patient Care Team: [place], [place], NP as PCP - General Rico Junker, RN as Registered Nurse Theodore Demark, RN as Registered Nurse Sindy Guadeloupe, MD as Consulting Physician (Hematology and Oncology)   Name of the patient: Sophia [name]  [id number]  10/09/1980   Date of visit: 06/18/20  Diagnosis- Breast Cancer  Chief complaint/ Reason for visit- diarrhea  Heme/Onc history:  Oncology History  Metastatic breast cancer ([place])  08/29/2019 Initial Diagnosis   Metastatic breast cancer ([place])   09/04/2019 -  Chemotherapy    Patient is on Treatment Plan: BREAST WEEKLY PACLITAXEL + TRASTUZUMAB + PERTUZUMAB Q21D       Genetic Testing   Negative genetic testing. No pathogenic variants identified on the [facility] CancerNext-Expanded+RNA panel. The report date is 04/13/2020.   The CancerNext-Expanded + RNAinsight gene panel offered by [employer] and includes sequencing and rearrangement analysis for the following 77 genes: IP, ALK, APC*, ATM*, AXIN2, BAP1, BARD1, BLM, BMPR1A, BRCA1*, BRCA2*, BRIP1*, CDC73, CDH1*,CDK4, CDKN1B, CDKN2A, CHEK2*, CTNNA1, DICER1, FANCC, FH, FLCN, GALNT12, KIF1B, LZTR1, MAX, MEN1, MET, MLH1*, MSH2*, MSH3, MSH6*, MUTYH*, NBN, NF1*, NF2, NTHL1, PALB2*, PHOX2B, PMS2*, POT1, PRKAR1A, PTCH1, PTEN*, RAD51C*, RAD51D*,RB1, RECQL, RET, SDHA, SDHAF2, SDHB, SDHC, SDHD, SMAD4, SMARCA4, SMARCB1, SMARCE1, STK11, SUFU, TMEM127, TP53*,TSC1, TSC2, VHL and XRCC2 (sequencing and deletion/duplication); EGFR, EGLN1, HOXB13, KIT, MITF, PDGFRA, POLD1 and POLE (sequencing only); EPCAM and GREM1 (deletion/duplication only). DNA and RNA analyses performed for * genes.     Interval history- Patient is 40 year old female currently receiving palliative chemotherapy for metastatic breast cancer, who presents to symptom management clinic for complaints of diarrhea.   Previously, she was found to be positive for C. difficile infection and E. coli.  Imaging was negative for colitis.  She was treated with oral vancomycin for 10 days.  Last dose on 06/12/2020.  Diarrhea had resolved at that time.  Abdominal pain had resolved.  Nausea and vomiting had resolved.  Today, she says diarrhea returned yesterday.  Less frequent than previous.  Abdominal pain described as mild on the left side.   Review of systems- Review of Systems  Constitutional: Negative for chills, fever, malaise/fatigue and weight loss.  HENT: Negative for hearing loss, nosebleeds, sore throat and tinnitus.   Eyes: Negative for blurred vision and double vision.  Respiratory: Negative for cough, hemoptysis, shortness of breath and wheezing.   Cardiovascular: Negative for chest pain, palpitations and leg swelling.  Gastrointestinal: Positive for abdominal pain, diarrhea, nausea and vomiting. Negative for blood in stool, constipation and melena.  Genitourinary: Negative for dysuria and urgency.  Musculoskeletal: Negative for back pain, falls, joint pain and myalgias.  Skin: Negative for itching and rash.  Neurological: Negative for dizziness, tingling, sensory change, loss of consciousness, weakness and headaches.  Endo/Heme/Allergies: Negative for environmental allergies. Does not bruise/bleed easily.  Psychiatric/Behavioral: Negative for depression. The patient is not nervous/anxious and does not have insomnia.      No Known Allergies  Past Medical History:  Diagnosis Date  . Anxiety   . Breast cancer ([place])    with mets  . Cancer ([place])   . Colitis   . Family history of colon cancer   . Vertigo     Past Surgical History:  Procedure Laterality Date  . BREAST BIOPSY Left 08/14/2019   [place] bx of mass, coil marker, path pending  . BREAST BIOPSY Left 08/14/2019   [place] bx of  LN, hydromarker, path pending  . BREAST BIOPSY Left 08/14/2019   affirm bx of calcs, x marker, path pending  .  ESOPHAGOGASTRODUODENOSCOPY (EGD) WITH PROPOFOL N/A 10/05/2019   Procedure: ESOPHAGOGASTRODUODENOSCOPY (EGD) WITH PROPOFOL;  Surgeon: Toney Reil, MD;  Location: [facility] ENDOSCOPY;  Service: Gastroenterology;  Laterality: N/A;  . FLEXIBLE SIGMOIDOSCOPY N/A 10/05/2019   Procedure: FLEXIBLE SIGMOIDOSCOPY;  Surgeon: Toney Reil, MD;  Location: [facility] ENDOSCOPY;  Service: Gastroenterology;  Laterality: N/A;  . INTRAMEDULLARY (IM) NAIL INTERTROCHANTERIC Right 09/01/2019   Procedure: INTRAMEDULLARY (IM) NAIL INTERTROCHANTRIC AND RADIOFREQUENCY ABLATION;  Surgeon: Kennedy Bucker, MD;  Location: [facility] ORS;  Service: Orthopedics;  Laterality: Right;  . KYPHOPLASTY N/A 08/29/2019   Procedure: KYPHOPLASTY T6, L1,L4 ,  RADIOFREQUENCY ABLATION;  Surgeon: Kennedy Bucker, MD;  Location: [facility] ORS;  Service: Orthopedics;  Laterality: N/A;  . KYPHOPLASTY Right 09/01/2019   Procedure: Right Sacral Radiofrequency Ablation and Cement Augmentation, Right sacrum and iliac crest;  Surgeon: Kennedy Bucker, MD;  Location: [facility] ORS;  Service: Orthopedics;  Laterality: Right;  . PORTA CATH INSERTION N/A 08/28/2019   Procedure: PORTA CATH INSERTION;  Surgeon: Annice Needy, MD;  Location: [facility] INVASIVE CV LAB;  Service: Cardiovascular;  Laterality: N/A;    Social History   Socioeconomic History  . Marital status: Single    Spouse name: Not on file  . Number of children: Not on file  . Years of education: Not on file  . Highest education level: Not on file  Occupational History  . Not on file  Tobacco Use  . Smoking status: Never Smoker  . Smokeless tobacco: Never Used  Vaping Use  . Vaping Use: Never used  Substance and Sexual Activity  . Alcohol use: Not Currently  . Drug use: Not Currently  . Sexual activity: Not Currently    Birth control/protection: None  Other Topics Concern  . Not on file  Social History Narrative   Lives at home with children   Social Determinants of Health   Financial Resource  Strain: Not on file  Food Insecurity: Not on file  Transportation Needs: Not on file  Physical Activity: Not on file  Stress: Not on file  Social Connections: Not on file  Intimate Partner Violence: Not on file    Family History  Problem Relation Age of Onset  . Colon cancer Maternal Uncle      Current Outpatient Medications:  .  clindamycin (CLINDAGEL) 1 % gel, Apply topically 2 (two) times daily. X 7 days., Disp: 30 g, Rfl: 0 .  diphenoxylate-atropine (LOMOTIL) 2.5-0.025 MG tablet, Take 1 tablet by mouth 4 (four) times daily as needed for diarrhea or loose stools. (Patient not taking: Reported on 06/11/2020), Disp: 40 tablet, Rfl: 0 .  FLUoxetine (PROZAC) 10 MG capsule, Take 1 capsule (10 mg total) by mouth daily., Disp: 30 capsule, Rfl: 3 .  gabapentin (NEURONTIN) 300 MG capsule, Take 1 capsule (300 mg total) by mouth 3 (three) times daily., Disp: 180 capsule, Rfl: 3 .  lidocaine-prilocaine (EMLA) cream, Apply 1 application topically as needed. Apply small amount to port site at least 1 hour prior to it being accessed, cover with plastic wrap, Disp: 30 g, Rfl: 1 .  oxycodone (OXY-IR) 5 MG capsule, Take 1-2 capsules (5-10 mg total) by mouth every 4 (four) hours as needed., Disp: 90 capsule, Rfl: 0 .  oxyCODONE (OXYCONTIN) 10 mg 12 hr tablet, Take 1 tablet (10 mg total) by mouth every 12 (twelve) hours., Disp: 60 tablet, Rfl: 0 .  potassium chloride SA (KLOR-CON) 20 MEQ tablet, Take 1 tablet (20 mEq total) by mouth 2 (two) times daily., Disp: 14 tablet, Rfl: 0 .  vancomycin (VANCOCIN HCL) 125 MG capsule, Take 1 capsule (125 mg total) by mouth every 6 (six) hours for 10 days., Disp: 40 capsule, Rfl: 0 No current facility-administered medications for this visit.  Facility-Administered Medications Ordered in Other Visits:  .  0.9 %  sodium chloride infusion, , Intravenous, Continuous, Burns, Wandra Feinstein, NP, Stopped at 06/02/20 1453  Physical exam: There were no vitals filed for this  visit. Physical Exam Constitutional:      General: She is not in acute distress.    Appearance: She is well-developed.  HENT:     Head: Normocephalic and atraumatic.     Mouth/Throat:     Pharynx: No oropharyngeal exudate.  Eyes:     General: No scleral icterus. Cardiovascular:     Rate and Rhythm: Normal rate and regular rhythm.  Pulmonary:     Effort: Pulmonary effort is normal. No respiratory distress.     Breath sounds: Normal breath sounds.  Abdominal:     General: Bowel sounds are normal. There is no distension.     Palpations: Abdomen is soft.     Tenderness: There is abdominal tenderness (diffusely tender to palpation on left). There is no guarding or rebound.  Musculoskeletal:        General: Normal range of motion.  Skin:    General: Skin is warm and dry.  Neurological:     Mental Status: She is alert and oriented to person, place, and time.  Psychiatric:        Mood and Affect: Mood normal.        Behavior: Behavior normal.      CMP Latest Ref Rng & Units 06/18/2020  Glucose 70 - 99 mg/dL 114(H)  BUN 6 - 20 mg/dL 6  Creatinine 0.44 - 1.00 mg/dL 0.49  Sodium 135 - 145 mmol/L 136  Potassium 3.5 - 5.1 mmol/L 3.5  Chloride 98 - 111 mmol/L 103  CO2 22 - 32 mmol/L 25  Calcium 8.9 - 10.3 mg/dL 8.4(L)  Total Protein 6.5 - 8.1 g/dL 6.7  Total Bilirubin 0.3 - 1.2 mg/dL 1.0  Alkaline Phos 38 - 126 U/L 60  AST 15 - 41 U/L 28  ALT 0 - 44 U/L 14   CBC Latest Ref Rng & Units 06/18/2020  WBC 4.0 - 10.5 K/uL 12.0(H)  Hemoglobin 12.0 - 15.0 g/dL 10.5(L)  Hematocrit 36.0 - 46.0 % 32.2(L)  Platelets 150 - 400 K/uL 455(H)    No images are attached to the encounter.  DG Chest 2 View  Result Date: 06/02/2020 CLINICAL DATA:  Fever.  History of metastatic breast carcinoma EXAM: CHEST - 2 VIEW COMPARISON:  October 01, 2019 chest radiograph; chest CT April 01, 2020 FINDINGS: There is slight scarring in the medial left upper lobe. The lungs elsewhere are clear. Heart size and  pulmonary vascular normal. No adenopathy. Port-A-Cath tip at cavoatrial junction. No pneumothorax. Patient has had previous kyphoplasty procedures at T7, L2, and L5. No blastic or lytic bone lesions evident by radiography. IMPRESSION: Mild scarring left upper lobe. Lungs otherwise clear. Heart size normal. No adenopathy. Previous kyphoplasty procedures at several levels. Electronically Signed   By: Lowella Grip III M.D.   On: 06/02/2020 11:23   CT Abdomen Pelvis W Contrast  Result Date: 06/03/2020 CLINICAL DATA:  Concern for gastroenteritis. Abdominal pain. History of breast cancer. Chemotherapy  ongoing. EXAM: CT ABDOMEN AND PELVIS WITH CONTRAST TECHNIQUE: Multidetector CT imaging of the abdomen and pelvis was performed using the standard protocol following bolus administration of intravenous contrast. CONTRAST:  170mL OMNIPAQUE IOHEXOL 300 MG/ML  SOLN COMPARISON:  CT 04/01/2020 FINDINGS: Lower chest: Lung bases are clear. Hepatobiliary: No focal hepatic lesion. No biliary duct dilatation. Common bile duct is normal. Pancreas: Pancreas is normal. No ductal dilatation. No pancreatic inflammation. Spleen: Normal spleen Adrenals/urinary tract: Adrenal glands and kidneys are normal. The ureters and bladder normal. Stomach/Bowel: Stomach, duodenum small-bowel normal. No evidence of small bowel inflammation or obstruction. Terminal ileum is normal. Appendix is normal. The entire colon is collapsed. Sigmoid colon and rectum are collapsed. No clear evidence of colonic inflammation. No mesenteric inflammation. Vascular/Lymphatic: Abdominal aorta is normal caliber. No periportal or retroperitoneal adenopathy. No pelvic adenopathy. Reproductive: Uterus and adnexa unremarkable. Other: No free fluid.  No peritoneal disease Musculoskeletal: No aggressive osseous lesion. Chronic compression fracture of the lumbar spine with augmentation. Round sclerotic lesions at T12 and T10 are not changed from comparison CT. Additional  smaller lesions in the lumbar spine. The lesions are not hypermetabolic on comparison PET-CT scan 12/16/2019. Sacral plasty in the RIGHT sacral ala. IMPRESSION: 1. No evidence of inflammation or obstruction the small bowel or colon. 2. Entire colon is collapsed without stool. 3. Gallbladder appendix are normal. 4. Round sclerotic skeletal lesions again noted. No interval change. Electronically Signed   By: Suzy Bouchard M.D.   On: 06/03/2020 11:30   DG HIP UNILAT WITH PELVIS 2-3 VIEWS RIGHT  Result Date: 06/07/2020 CLINICAL DATA:  Fall last Thursday with posterolateral right hip pain. Metastatic breast cancer. EXAM: DG HIP (WITH OR WITHOUT PELVIS) 2-3V RIGHT COMPARISON:  08/28/2019 right hip radiographs FINDINGS: Intramedullary rod in the right femoral shaft with interlocking right femoral neck pin and distal interlocking screw with no evidence of hardware fracture or loosening. Linear cortical discontinuity in the anterior right proximal femoral metaphysis on the lateral view, cannot exclude a small nondisplaced periprosthetic fracture. No right hip dislocation. Otherwise no potential acute pelvic fracture. Dense material in the upper right sacrum and medial right iliac bone. Sclerotic lesion in the supra-acetabular peripheral left iliac bone as seen on recent CT abdomen/pelvis study. IMPRESSION: 1. Linear cortical discontinuity in the anterior right proximal femoral metaphysis on the lateral view, cannot exclude a small nondisplaced periprosthetic fracture. Right hip CT may be obtained for further evaluation as clinically warranted. 2. Sclerotic lesion in supra-acetabular left iliac bone as seen on recent CT abdomen study, compatible with metastasis. Electronically Signed   By: Ilona Sorrel M.D.   On: 06/07/2020 15:09    Assessment and plan- Patient is a 40 y.o. female with metastatic breast cancer currently receiving palliative chemotherapy who presents to [facility] for diarrhea. Hold chemotherapy today. Hx  of C diff and EPEC s/p oral vancomycin on 3/26. No colitis. Diarrhea has returned. Low grade fever, mild abdominal pain, vomiting. Plan to repeat stool studies, c diff and gi panel, obtain blood cultures and supportive care with iv fluids, 1 L NaCl and tylenol 1000 mg in clinic today. Advised her to hold antidiarrheals to reduce risk of megacolon. Antiemetics as prescribed. Labs today reviewed: cultures and GI panel pending. WBC elevated similarly to when she was initially diagnosed with c diff & epec. Dr. Janese Banks discussed with GI, Dr. Vicente Males who recommends empiracally restarting vancomycin 125 q6h while awaiting repeat stool studies. If stool positive for EPEC, plan to add azithromycin. If no improvement  by Monday, plan for patient to see covering physician, Dr. Vicente Males, otherwise, follow up with Dr. Marius Ditch next week.    Visit Diagnosis 1. C. difficile diarrhea     Patient expressed understanding and was in agreement with this plan. She also understands that She can call clinic at any time with any questions, concerns, or complaints.   Thank you for allowing me to participate in the care of this very pleasant patient.   Beckey Rutter, DNP, AGNP-[facility] [phone]  Due to language barrier, a spanish language interpreter was utilized for all patient interactions.

## 2020-06-21 ENCOUNTER — Telehealth: Payer: Self-pay | Admitting: *Deleted

## 2020-06-21 ENCOUNTER — Other Ambulatory Visit: Payer: Self-pay

## 2020-06-21 DIAGNOSIS — A498 Other bacterial infections of unspecified site: Secondary | ICD-10-CM

## 2020-06-21 DIAGNOSIS — A0472 Enterocolitis due to Clostridium difficile, not specified as recurrent: Secondary | ICD-10-CM

## 2020-06-21 DIAGNOSIS — C50919 Malignant neoplasm of unspecified site of unspecified female breast: Secondary | ICD-10-CM

## 2020-06-21 LAB — GASTROINTESTINAL PANEL BY PCR, STOOL (REPLACES STOOL CULTURE)

## 2020-06-21 LAB — CLOSTRIDIUM DIFFICILE BY PCR, REFLEXED: Toxigenic C. Difficile by PCR: POSITIVE — AB

## 2020-06-21 LAB — C DIFFICILE QUICK SCREEN W PCR REFLEX
C Diff antigen: POSITIVE — AB
C Diff toxin: NEGATIVE

## 2020-06-21 NOTE — Telephone Encounter (Signed)
Called patient asked how she is doing today.  She says that she is not having diarrhea stools anymore.  3 times she eats she does have a stool but it is not runny.  He has checked her fever with a ear thermometer as well as oral and there is no fever.  The only thing that is hurting her more now is the fact that every time she stands on her right leg or walks on her right leg it hurts more so she is taking her breakthrough pain medicine now.i told her that I would pass on the message about leg

## 2020-06-22 ENCOUNTER — Other Ambulatory Visit
Admission: RE | Admit: 2020-06-22 | Discharge: 2020-06-22 | Disposition: A | Discharge status: Home / Self Care | Payer: Self-pay | Source: Ambulatory Visit | Attending: Oncology | Admitting: Oncology

## 2020-06-22 ENCOUNTER — Ambulatory Visit
Admission: RE | Admit: 2020-06-22 | Discharge: 2020-06-22 | Disposition: A | Discharge status: Home / Self Care | Payer: Self-pay | Source: Ambulatory Visit | Attending: Oncology | Admitting: Oncology

## 2020-06-22 ENCOUNTER — Other Ambulatory Visit: Payer: Self-pay

## 2020-06-22 DIAGNOSIS — Z20822 Contact with and (suspected) exposure to covid-19: Secondary | ICD-10-CM | POA: Insufficient documentation

## 2020-06-22 DIAGNOSIS — Y92009 Unspecified place in unspecified non-institutional (private) residence as the place of occurrence of the external cause: Secondary | ICD-10-CM

## 2020-06-22 DIAGNOSIS — Y939 Activity, unspecified: Secondary | ICD-10-CM | POA: Insufficient documentation

## 2020-06-22 DIAGNOSIS — Y929 Unspecified place or not applicable: Secondary | ICD-10-CM | POA: Insufficient documentation

## 2020-06-22 DIAGNOSIS — M25552 Pain in left hip: Secondary | ICD-10-CM | POA: Insufficient documentation

## 2020-06-22 DIAGNOSIS — Z01812 Encounter for preprocedural laboratory examination: Secondary | ICD-10-CM | POA: Insufficient documentation

## 2020-06-22 DIAGNOSIS — W19XXXA Unspecified fall, initial encounter: Secondary | ICD-10-CM

## 2020-06-22 LAB — SARS CORONAVIRUS 2 (TAT 6-24 HRS): SARS Coronavirus 2: NEGATIVE

## 2020-06-22 IMAGING — CR DG HIP (WITH OR WITHOUT PELVIS) 2-3V*L*
3 series · 3 of 3 positions shown · non-contrast
Comparison: [DATE].  CT [DATE].

CLINICAL DATA: Fall.  Hip pain.

EXAM:
DG HIP (WITH OR WITHOUT PELVIS) 2-3V LEFT

[pelvis ap]
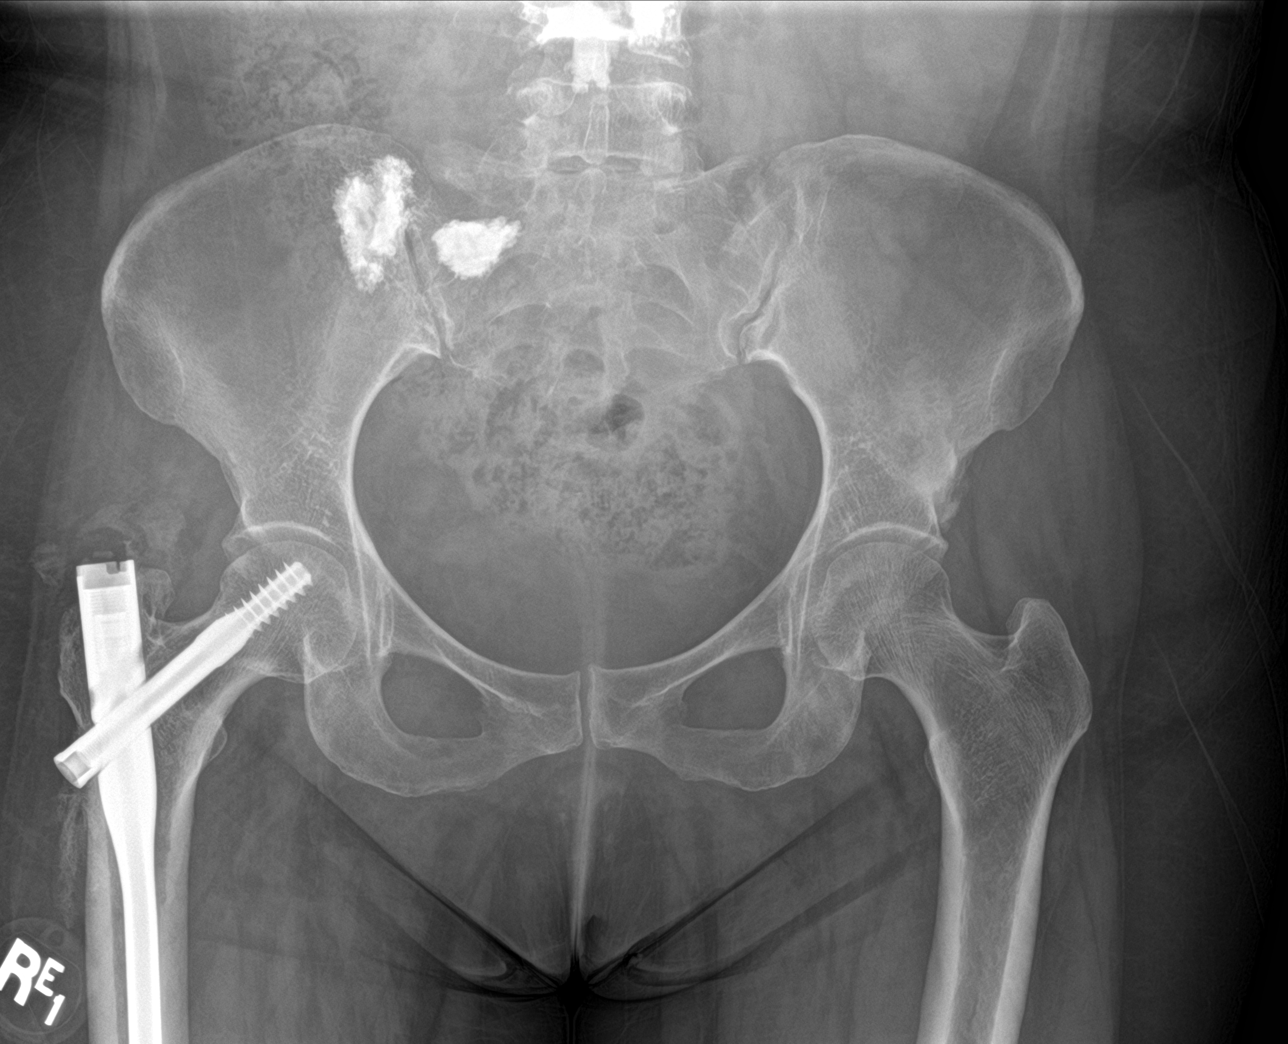

[hip ap]
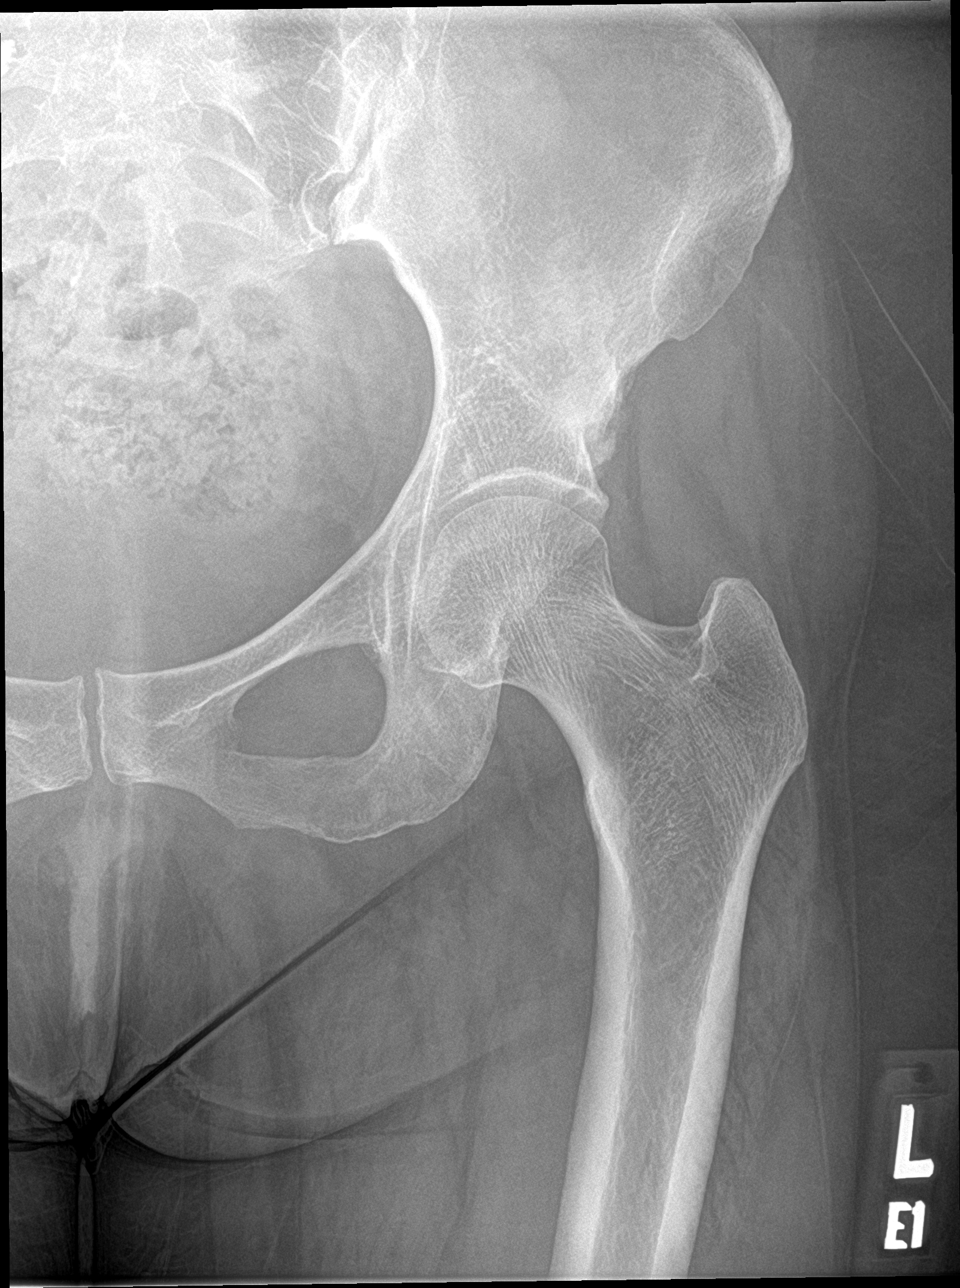

[hip lat]
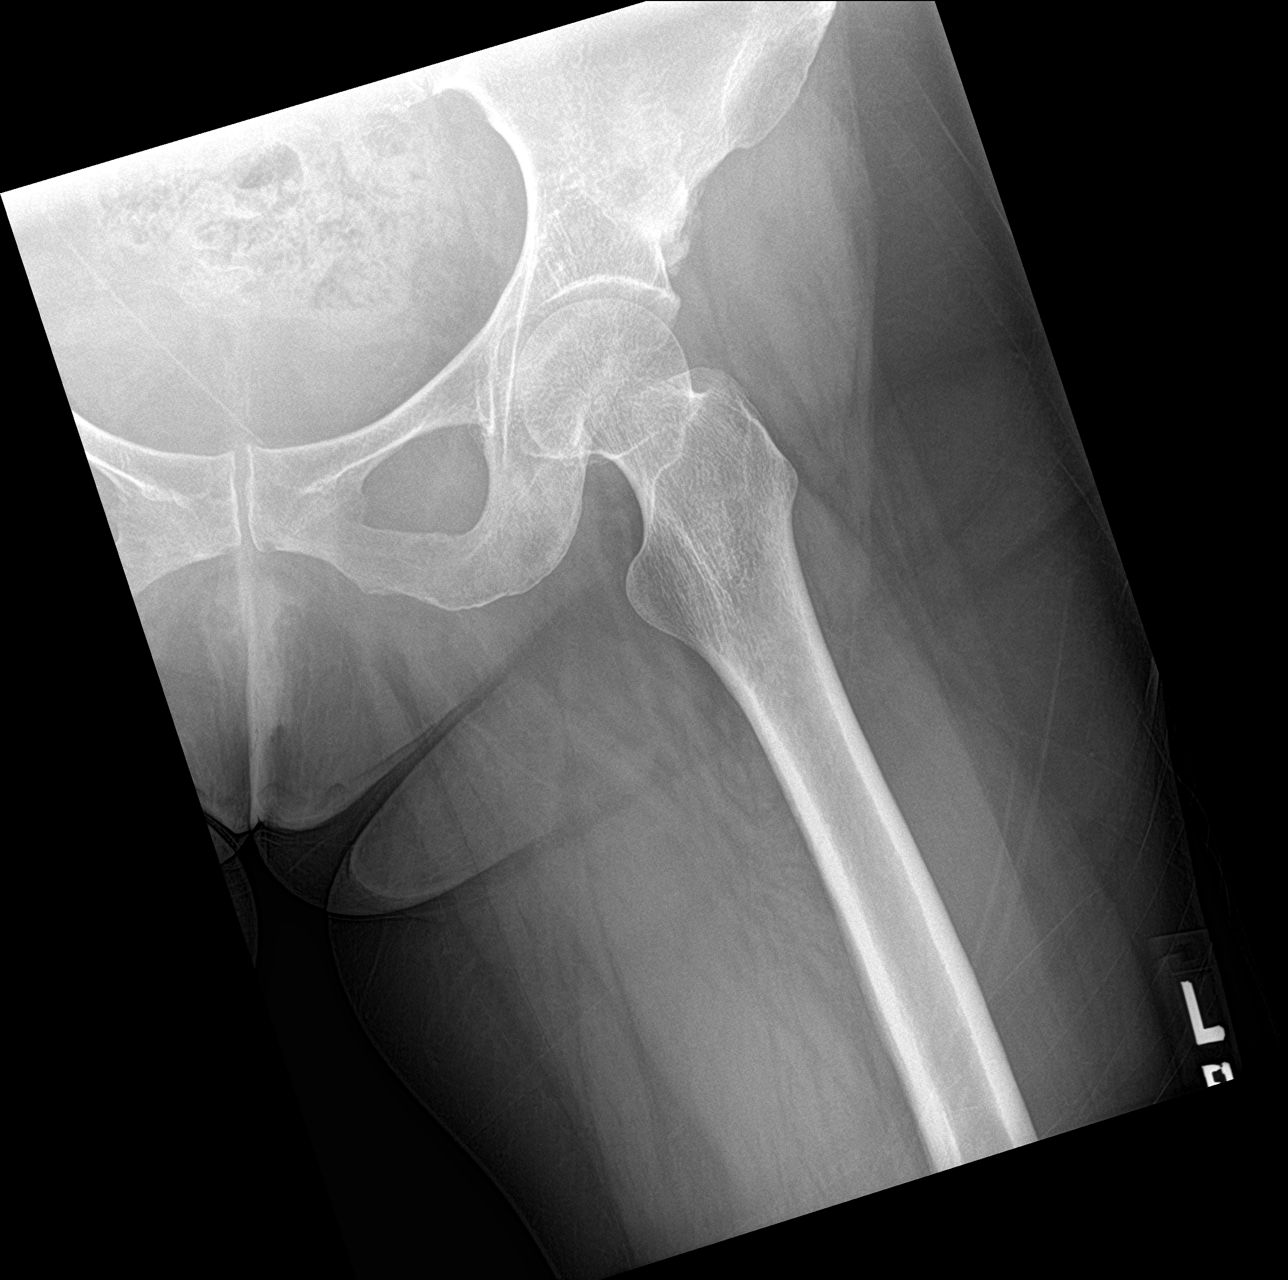

[3 of 3 positions shown; findings below may reference images not displayed]

FINDINGS: Postsurgical changes right hip. Postsurgical changes noted about the
lower lumbar spine, right sacrum, right ilium. Stable sclerotic
focus left iliac bone just above the acetabulum. Again metastatic
disease could present in this fashion. Left hip is intact. No
evidence of left hip fracture or dislocation.
IMPRESSION: 1. Postsurgical changes right hip. Postsurgical changes about the
lower lumbar spine, right sacrum, right ilium.

2. Stable sclerotic focus left iliac bone just above the acetabulum.
Again metastatic disease could present this fashion.

3.  Left hip is unremarkable.  No acute abnormality noted.

## 2020-06-23 LAB — CULTURE, BLOOD (ROUTINE X 2)
Culture: NO GROWTH
Culture: NO GROWTH
Special Requests: ADEQUATE
Special Requests: ADEQUATE

## 2020-06-24 ENCOUNTER — Telehealth: Payer: Self-pay | Admitting: Gastroenterology

## 2020-06-24 ENCOUNTER — Ambulatory Visit
Admission: RE | Admit: 2020-06-24 | Discharge: 2020-06-24 | Disposition: A | Discharge status: Home / Self Care | Payer: Self-pay | Source: Ambulatory Visit | Attending: Oncology | Admitting: Oncology

## 2020-06-24 ENCOUNTER — Telehealth: Payer: Self-pay | Admitting: *Deleted

## 2020-06-24 ENCOUNTER — Other Ambulatory Visit: Payer: Self-pay

## 2020-06-24 DIAGNOSIS — F419 Anxiety disorder, unspecified: Secondary | ICD-10-CM | POA: Insufficient documentation

## 2020-06-24 DIAGNOSIS — Z01818 Encounter for other preprocedural examination: Secondary | ICD-10-CM | POA: Insufficient documentation

## 2020-06-24 DIAGNOSIS — Z0189 Encounter for other specified special examinations: Secondary | ICD-10-CM

## 2020-06-24 DIAGNOSIS — Z5111 Encounter for antineoplastic chemotherapy: Secondary | ICD-10-CM

## 2020-06-24 DIAGNOSIS — C50919 Malignant neoplasm of unspecified site of unspecified female breast: Secondary | ICD-10-CM | POA: Insufficient documentation

## 2020-06-24 LAB — ECHOCARDIOGRAM COMPLETE
AR max vel: 1.85 cm2
AV Area VTI: 2.1 cm2
AV Area mean vel: 1.77 cm2
AV Mean grad: 4.5 mmHg
AV Peak grad: 7 mmHg
Ao pk vel: 1.32 m/s
Area-P 1/2: 4.06 cm2
S' Lateral: 2.27 cm

## 2020-06-24 NOTE — Telephone Encounter (Signed)
Dr. Elroy Channel office called wanting Sophia [name] in asap due to positive lab results.  LVM for patient to call our office for an appointment with Dr. Marius Ditch.

## 2020-06-24 NOTE — Progress Notes (Signed)
*  PRELIMINARY RESULTS* Echocardiogram 2D Echocardiogram has been performed.  Sophia [name] 06/24/2020, 11:07 AM

## 2020-06-24 NOTE — Telephone Encounter (Signed)
Called the pt to check on her. She is not having any more loose stools at all. She is taking vancomycin. She is taking pain meds to keep her pain down from the right leg. She has appt with Menz-[facility] Vanga on 4/11. One is am and one in pm. She is doing good today

## 2020-06-28 ENCOUNTER — Ambulatory Visit (INDEPENDENT_AMBULATORY_CARE_PROVIDER_SITE_OTHER): Payer: Self-pay | Admitting: Gastroenterology

## 2020-06-28 ENCOUNTER — Ambulatory Visit: Payer: Self-pay

## 2020-06-28 ENCOUNTER — Other Ambulatory Visit: Payer: Self-pay

## 2020-06-28 ENCOUNTER — Encounter: Payer: Self-pay | Admitting: Gastroenterology

## 2020-06-28 VITALS — BP 100/66 | HR 84 | Temp 98.0°F | Ht 60.0 in | Wt 135.2 lb

## 2020-06-28 DIAGNOSIS — A0471 Enterocolitis due to Clostridium difficile, recurrent: Secondary | ICD-10-CM

## 2020-06-28 MED ORDER — VANCOMYCIN HCL 125 MG PO CAPS: 125.0000 mg | ORAL_CAPSULE | Freq: Four times a day (QID) | ORAL | 0 refills | Status: DC

## 2020-06-28 NOTE — Progress Notes (Signed)
Cephas Darby, MD [address]  [place]  [place], [place] [address]  Main: [phone]  Fax: [phone]    Gastroenterology Consultation  Referring Provider:     [facility], [facility] Primary Care Physician:  [facility], [place], NP Primary Gastroenterologist:  Dr. Cephas Darby Reason for Consultation: Recurrent C. difficile infection        HPI:   Sophia [name] is a 40 y.o. female referred by Dr. Domingo Madeira, [facility], NP  for consultation & management of recent diagnosis of acute noninfectious colitis.  Patient has history of stage IV metastatic breast cancer ER/PR negative, HER-2/neu positive with bone mets, s/p chemo who was admitted to [facility] in July 2021 secondary to epigastric pain, poor p.o. intake, rectal bleeding, abdominal pain.  Cross-sectional imaging revealed right-sided colitis.  Stool studies were negative for infection.  She was empirically started on antibiotics and her symptoms responded.  Due to presence of rectal bleeding, patient underwent colonoscopy which revealed right-sided colitis and pathology confirmed mild active colitis with ulceration with evidence of chronicity.  Her colitis was thought to be chemo induced particularly Herceptin and Perjeta.  After discussing with Dr. Janese Banks, decision was made to treat symptomatically with antidiarrheal medication before initiation of Entyvio.  Patient has done well and her diarrhea has overall improved.  She does have mild episodes whenever she receives chemo and responds well to Imodium.  Patient also reports that her appetite has improved.  She is no longer experiencing epigastric pain.  Upper endoscopy was unremarkable including biopsies.  Follow-up visit 06/28/20 Patient is referred by Dr. Janese Banks for recurrent C. difficile infection.  She had first episode of C. difficile infection on 06/03/2020 treated with 10 days course of oral vancomycin, last dose 06/12/2020, second episode on 06/21/2020,  treated with 10 days course of oral vancomycin, last dose 06/27/2020.  Patient reports that her diarrhea resolved 1 week ago.  Reports having formed bowel movement daily.  She denies any GI symptoms today.  Apparently, she had infection secondary to E. coli as well as adenovirus.  She is on chemotherapy for stage IV breast cancer   She does not have any GI concerns today  NSAIDs: None  Antiplts/Anticoagulants/Anti thrombotics: None  GI Procedures: EGD and colonoscopy 10/05/2019 - Normal duodenal bulb and second portion of the duodenum. - Erythematous mucosa in the gastric body. Biopsied. - Normal incisura and antrum. Biopsied. - Medium-sized hiatal hernia. - Normal gastroesophageal junction and esophagus.  - The examined portion of the ileum was normal. Biopsied. - Diffuse severe inflammation was found in the ascending colon and in the cecum secondary to colitis. Biopsied. - Friability with no bleeding in the rectum, in the sigmoid colon, in the descending colon and in the transverse colon. Biopsied. - The distal rectum and anal verge are normal on retroflexion view.   Past Medical History:  Diagnosis Date  . Anxiety   . Breast cancer ([place])    with mets  . Cancer ([place])   . Colitis   . Family history of colon cancer   . Vertigo     Past Surgical History:  Procedure Laterality Date  . BREAST BIOPSY Left 08/14/2019   [place] bx of mass, coil marker, path pending  . BREAST BIOPSY Left 08/14/2019   [place] bx of LN, hydromarker, path pending  . BREAST BIOPSY Left 08/14/2019   affirm bx of calcs, x marker, path pending  . ESOPHAGOGASTRODUODENOSCOPY (EGD) WITH PROPOFOL N/A 10/05/2019  Procedure: ESOPHAGOGASTRODUODENOSCOPY (EGD) WITH PROPOFOL;  Surgeon: Toney Reil, MD;  Location[facility] ENDOSCOPY;  Service: Gastroenterology;  Laterality: N/A;  . FLEXIBLE SIGMOIDOSCOPY N/A 10/05/2019   Procedure: FLEXIBLE SIGMOIDOSCOPY;  Surgeon: Toney Reil, MD;  Location: [facility] ENDOSCOPY;   Service: Gastroenterology;  Laterality: N/A;  . INTRAMEDULLARY (IM) NAIL INTERTROCHANTERIC Right 09/01/2019   Procedure: INTRAMEDULLARY (IM) NAIL INTERTROCHANTRIC AND RADIOFREQUENCY ABLATION;  Surgeon: Kennedy Bucker, MD;  Location: [facility] ORS;  Service: Orthopedics;  Laterality: Right;  . KYPHOPLASTY N/A 08/29/2019   Procedure: KYPHOPLASTY T6, L1,L4 ,  RADIOFREQUENCY ABLATION;  Surgeon: Kennedy Bucker, MD;  Location: [facility] ORS;  Service: Orthopedics;  Laterality: N/A;  . KYPHOPLASTY Right 09/01/2019   Procedure: Right Sacral Radiofrequency Ablation and Cement Augmentation, Right sacrum and iliac crest;  Surgeon: Kennedy Bucker, MD;  Location: [facility] ORS;  Service: Orthopedics;  Laterality: Right;  . PORTA CATH INSERTION N/A 08/28/2019   Procedure: PORTA CATH INSERTION;  Surgeon: Annice Needy, MD;  Location: [facility] INVASIVE CV LAB;  Service: Cardiovascular;  Laterality: N/A;    Current Outpatient Medications:  .  clindamycin (CLINDAGEL) 1 % gel, Apply topically 2 (two) times daily. X 7 days., Disp: 30 g, Rfl: 0 .  diphenoxylate-atropine (LOMOTIL) 2.5-0.025 MG tablet, Take 1 tablet by mouth 4 (four) times daily as needed for diarrhea or loose stools., Disp: 40 tablet, Rfl: 0 .  FLUoxetine (PROZAC) 10 MG capsule, Take 1 capsule (10 mg total) by mouth daily., Disp: 30 capsule, Rfl: 3 .  gabapentin (NEURONTIN) 300 MG capsule, Take 1 capsule (300 mg total) by mouth 3 (three) times daily., Disp: 180 capsule, Rfl: 3 .  lidocaine-prilocaine (EMLA) cream, Apply 1 application topically as needed. Apply small amount to port site at least 1 hour prior to it being accessed, cover with plastic wrap, Disp: 30 g, Rfl: 1 .  oxycodone (OXY-IR) 5 MG capsule, Take 1-2 capsules (5-10 mg total) by mouth every 4 (four) hours as needed., Disp: 90 capsule, Rfl: 0 .  oxyCODONE (OXYCONTIN) 10 mg 12 hr tablet, Take 1 tablet (10 mg total) by mouth every 12 (twelve) hours., Disp: 60 tablet, Rfl: 0 .  vancomycin (VANCOCIN) 125 MG capsule,  Take 1 capsule (125 mg total) by mouth 4 (four) times daily for 28 days., Disp: 112 capsule, Rfl: 0 No current facility-administered medications for this visit.  Facility-Administered Medications Ordered in Other Visits:  .  0.9 %  sodium chloride infusion, , Intravenous, Continuous, Burns, Renda Rolls, NP, Stopped at 06/02/20 1453   Family History  Problem Relation Age of Onset  . Colon cancer Maternal Uncle      Social History   Tobacco Use  . Smoking status: Never Smoker  . Smokeless tobacco: Never Used  Vaping Use  . Vaping Use: Never used  Substance Use Topics  . Alcohol use: Not Currently  . Drug use: Not Currently    Allergies as of 06/28/2020  . (No Known Allergies)    Review of Systems:    All systems reviewed and negative except where noted in HPI.   Physical Exam:  BP 100/66 (BP Location: Left Arm, Patient Position: Sitting, Cuff Size: Normal)   Pulse 84   Temp 98 F (36.7 C) (Oral)   Ht 5' (1.524 m)   Wt 135 lb 4 oz (61.3 kg)   BMI 26.41 kg/m  No LMP recorded. (Menstrual status: Chemotherapy).  General:   Alert,  Well-developed, well-nourished, pleasant and cooperative in NAD Head:  Normocephalic and atraumatic. Eyes:  Sclera  clear, no icterus.   Conjunctiva pink. Ears:  Normal auditory acuity. Nose:  No deformity, discharge, or lesions. Mouth:  No deformity or lesions,oropharynx pink & moist. Neck:  Supple; no masses or thyromegaly. Lungs:  Respirations even and unlabored.  Clear throughout to auscultation.   No wheezes, crackles, or rhonchi. No acute distress. Heart:  Regular rate and rhythm; no murmurs, clicks, rubs, or gallops. Abdomen:  Normal bowel sounds. Soft, non-tender and non-distended without masses, hepatosplenomegaly or hernias noted.  No guarding or rebound tenderness.   Rectal: Not performed Msk:  Symmetrical without gross deformities. Good, equal movement & strength bilaterally. Pulses:  Normal pulses noted. Extremities:  No clubbing  or edema.  No cyanosis. Neurologic:  Alert and oriented x3;  grossly normal neurologically. Skin:  Intact without significant lesions or rashes. No jaundice. Psych:  Alert and cooperative. Normal mood and affect.  Imaging Studies: Reviewed  Assessment and Plan:   Karsten Vaughn is a 40 y.o. pleasant Hispanic female with stage IV metastatic breast cancer, currently on chemo, right-sided colitis secondary to chemotherapy induced, confirmed based on the colonoscopy.  Patient is seen here for recurrent C. difficile infection, s/p treatment with oral vancomycin x10 days for 2 courses.  Patient's diarrhea has currently resolved, last episode of diarrhea was about a week ago.  Currently having formed bowel movements.  Her last dose of vancomycin was on 4/10.  If she developed recurrence of diarrhea, will recheck for recurrence of C. difficile infection, we can try Dificid and/or Zinplava.  Patient will not be a candidate for FMT secondary to chemotherapy  Advised patient to call my office if she develops recurrence of diarrhea  Follow up as needed   Cephas Darby, MD

## 2020-06-30 ENCOUNTER — Encounter
Admission: RE | Admit: 2020-06-30 | Discharge: 2020-06-30 | Disposition: A | Discharge status: Home / Self Care | Payer: Self-pay | Source: Ambulatory Visit | Attending: Oncology | Admitting: Oncology

## 2020-06-30 ENCOUNTER — Other Ambulatory Visit: Payer: Self-pay

## 2020-06-30 DIAGNOSIS — C50919 Malignant neoplasm of unspecified site of unspecified female breast: Secondary | ICD-10-CM

## 2020-06-30 DIAGNOSIS — C7951 Secondary malignant neoplasm of bone: Secondary | ICD-10-CM

## 2020-06-30 IMAGING — NM NM BONE WHOLE BODY
2 series · 12 of 12 positions shown · non-contrast
Comparison: [DATE]

CLINICAL DATA: Metastatic breast cancer with bone metastases,
history and deficiency anemia, RIGHT hip fracture with surgery [DATE], kyphoplasties of T6, L1 and L4 in [DATE], fell 4 weeks ago
striking RIGHT hip

EXAM:
NUCLEAR MEDICINE WHOLE BODY BONE SCAN
TECHNIQUE: Whole body anterior and posterior images were obtained approximately
3 hours after intravenous injection of radiopharmaceutical.
RADIOPHARMACEUTICALS:  21.785 mCi [3R] MDP IV

[Series 1000: 3 hr wholebody · 2.40mm/px · 2 of 2 frames shown]
[frame 1/2]
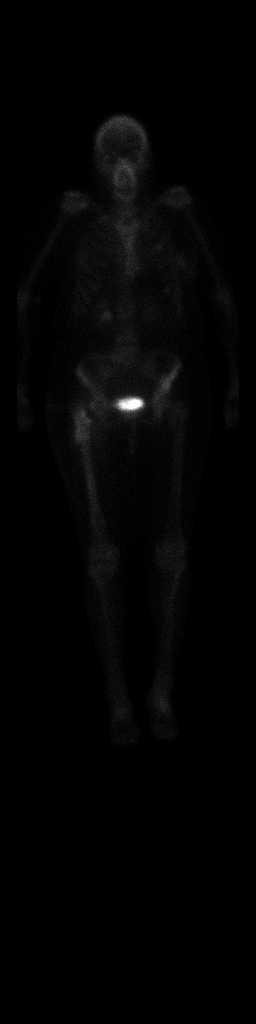
[frame 2/2]
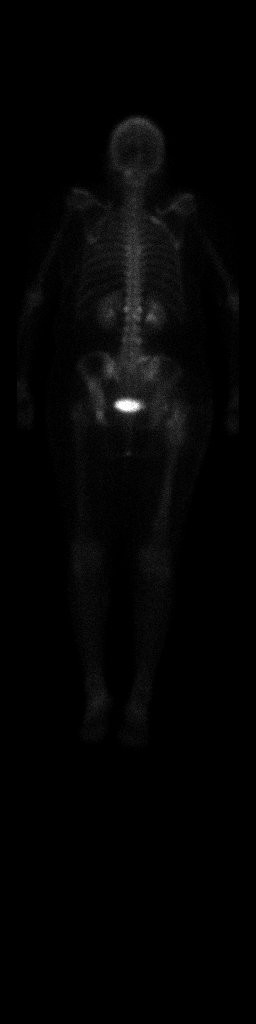

[Series 1000: statics · 2.40mm/px · 5 acquisitions, 10 frames shown]
[im 1/5]
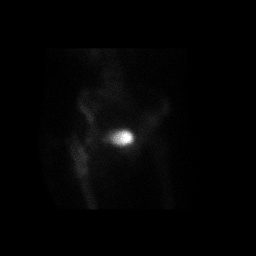
[im 1/5]
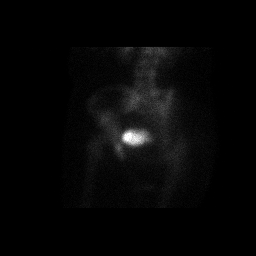
[im 2/5]
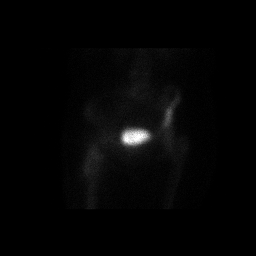
[im 2/5]
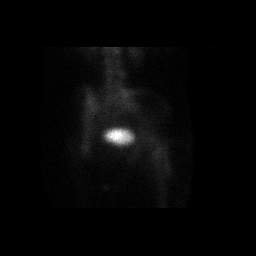
[im 3/5]
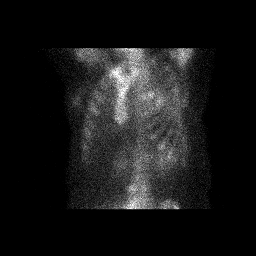
[im 3/5]
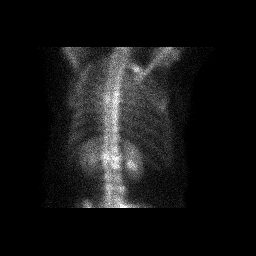
[im 4/5]
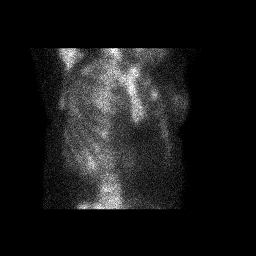
[im 4/5]
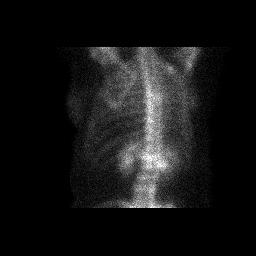
[im 5/5]
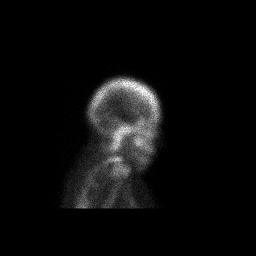
[im 5/5]
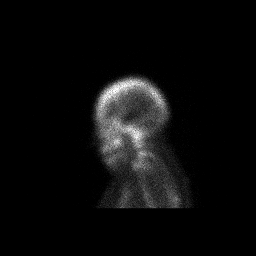

[12 of 12 positions shown; findings below may reference images not displayed]

Correlation: CT abdomen and pelvis [DATE], RIGHT hip radiographs
[DATE], LEFT hip radiographs [DATE]
FINDINGS: Abnormal sites of tracer uptake in the LEFT skull base, posterior
RIGHT second rib, anterior LEFT third rib, and BILATERAL pelvis
consistent with osseous metastatic disease.

Uptake at the posterior RIGHT second rib appears progressive since
the previous exam.

Uptake in the upper lumbar spine at L1 corresponding to kyphoplasty.

Scattered uptake at costovertebral junction of thoracic spine and at
RIGHT L5-S1 likely degenerative.

Uptake within RIGHT femur corresponding to healing fracture and
postsurgical changes.

Uptake in the posterior RIGHT second rib appears slightly increased.

No new sites of osseous metastasis identified.

Expected urinary tract and soft tissue distribution of tracer.
IMPRESSION: Stable distribution of osseous metastases though the uptake at the
posterior RIGHT second rib has slightly increased.

No new sites of osseous metastasis identified.

Uptake in RIGHT femur consistent with healing fracture and
postsurgical changes.

## 2020-06-30 MED ORDER — TECHNETIUM TC 99M MEDRONATE IV KIT
20.0000 | PACK | Freq: Once | INTRAVENOUS | Status: AC | PRN
  Administered 2020-06-30: 21.785 via INTRAVENOUS

## 2020-07-02 ENCOUNTER — Inpatient Hospital Stay: Payer: Self-pay

## 2020-07-02 ENCOUNTER — Inpatient Hospital Stay (HOSPITAL_BASED_OUTPATIENT_CLINIC_OR_DEPARTMENT_OTHER): Payer: Self-pay | Admitting: Oncology

## 2020-07-02 ENCOUNTER — Encounter: Payer: Self-pay | Admitting: Oncology

## 2020-07-02 ENCOUNTER — Other Ambulatory Visit: Payer: Self-pay

## 2020-07-02 VITALS — BP 120/81 | HR 78 | Temp 98.1°F | Resp 16 | Ht 60.0 in | Wt 135.7 lb

## 2020-07-02 DIAGNOSIS — C50919 Malignant neoplasm of unspecified site of unspecified female breast: Secondary | ICD-10-CM

## 2020-07-02 DIAGNOSIS — Z5112 Encounter for antineoplastic immunotherapy: Secondary | ICD-10-CM

## 2020-07-02 DIAGNOSIS — M8440XA Pathological fracture, unspecified site, initial encounter for fracture: Secondary | ICD-10-CM

## 2020-07-02 DIAGNOSIS — C7951 Secondary malignant neoplasm of bone: Secondary | ICD-10-CM

## 2020-07-02 DIAGNOSIS — Z79899 Other long term (current) drug therapy: Secondary | ICD-10-CM

## 2020-07-02 DIAGNOSIS — G893 Neoplasm related pain (acute) (chronic): Secondary | ICD-10-CM

## 2020-07-02 DIAGNOSIS — Z5181 Encounter for therapeutic drug level monitoring: Secondary | ICD-10-CM

## 2020-07-02 DIAGNOSIS — Z5111 Encounter for antineoplastic chemotherapy: Secondary | ICD-10-CM

## 2020-07-02 LAB — CBC WITH DIFFERENTIAL/PLATELET
Abs Immature Granulocytes: 0.02 10*3/uL (ref 0.00–0.07)
Basophils Absolute: 0 10*3/uL (ref 0.0–0.1)
Basophils Relative: 1 %
Eosinophils Absolute: 0.2 10*3/uL (ref 0.0–0.5)
Eosinophils Relative: 4 %
HCT: 32.8 % — ABNORMAL LOW (ref 36.0–46.0)
Hemoglobin: 10.5 g/dL — ABNORMAL LOW (ref 12.0–15.0)
Immature Granulocytes: 0 %
Lymphocytes Relative: 25 %
Lymphs Abs: 1.4 10*3/uL (ref 0.7–4.0)
MCH: 26.1 pg (ref 26.0–34.0)
MCHC: 32 g/dL (ref 30.0–36.0)
MCV: 81.4 fL (ref 80.0–100.0)
Monocytes Absolute: 0.5 10*3/uL (ref 0.1–1.0)
Monocytes Relative: 9 %
Neutro Abs: 3.5 10*3/uL (ref 1.7–7.7)
Neutrophils Relative %: 61 %
Platelets: 401 10*3/uL — ABNORMAL HIGH (ref 150–400)
RBC: 4.03 MIL/uL (ref 3.87–5.11)
RDW: 17.1 % — ABNORMAL HIGH (ref 11.5–15.5)
WBC: 5.7 10*3/uL (ref 4.0–10.5)
nRBC: 0 % (ref 0.0–0.2)

## 2020-07-02 LAB — COMPREHENSIVE METABOLIC PANEL
ALT: 20 U/L (ref 0–44)
AST: 36 U/L (ref 15–41)
Albumin: 3.7 g/dL (ref 3.5–5.0)
Alkaline Phosphatase: 59 U/L (ref 38–126)
Anion gap: 10 (ref 5–15)
BUN: 15 mg/dL (ref 6–20)
CO2: 24 mmol/L (ref 22–32)
Calcium: 8.6 mg/dL — ABNORMAL LOW (ref 8.9–10.3)
Chloride: 105 mmol/L (ref 98–111)
Creatinine, Ser: 0.48 mg/dL (ref 0.44–1.00)
GFR, Estimated: >60 mL/min (ref >60)
Glucose, Bld: 102 mg/dL — ABNORMAL HIGH (ref 70–99)
Potassium: 3.4 mmol/L — ABNORMAL LOW (ref 3.5–5.1)
Sodium: 139 mmol/L (ref 135–145)
Total Bilirubin: 0.6 mg/dL (ref 0.3–1.2)
Total Protein: 6.9 g/dL (ref 6.5–8.1)

## 2020-07-02 MED ORDER — SODIUM CHLORIDE 0.9 % IV SOLN
65.0000 mg/m2 | Freq: Once | INTRAVENOUS | Status: AC
Start: 2020-07-02 — End: 2020-07-02
  Administered 2020-07-02: 108 mg via INTRAVENOUS
  Filled 2020-07-02: qty 18

## 2020-07-02 MED ORDER — HEPARIN SOD (PORK) LOCK FLUSH 100 UNIT/ML IV SOLN
500.0000 [IU] | Freq: Once | INTRAVENOUS | Status: AC
Start: 2020-07-02 — End: 2020-07-02
  Administered 2020-07-02: 500 [IU] via INTRAVENOUS
  Filled 2020-07-02: qty 5

## 2020-07-02 MED ORDER — SODIUM CHLORIDE 0.9 % IV SOLN
Freq: Once | INTRAVENOUS | Status: AC
  Filled 2020-07-02: qty 250

## 2020-07-02 MED ORDER — SODIUM CHLORIDE 0.9 % IV SOLN
6.0000 mg/kg | Freq: Once | INTRAVENOUS | Status: AC
Start: 2020-07-02 — End: 2020-07-02
  Administered 2020-07-02: 357 mg via INTRAVENOUS
  Filled 2020-07-02: qty 17

## 2020-07-02 MED ORDER — DENOSUMAB 120 MG/1.7ML ~~LOC~~ SOLN
120.0000 mg | Freq: Once | SUBCUTANEOUS | Status: AC
Start: 2020-07-02 — End: 2020-07-02
  Administered 2020-07-02: 120 mg via SUBCUTANEOUS
  Filled 2020-07-02: qty 1.7

## 2020-07-02 MED ORDER — DEXAMETHASONE SODIUM PHOSPHATE 100 MG/10ML IJ SOLN
20.0000 mg | Freq: Once | INTRAMUSCULAR | Status: AC
  Administered 2020-07-02: 20 mg via INTRAVENOUS
  Filled 2020-07-02: qty 20

## 2020-07-02 MED ORDER — SODIUM CHLORIDE 0.9% FLUSH
10.0000 mL | Freq: Once | INTRAVENOUS | Status: AC
  Administered 2020-07-02: 10 mL via INTRAVENOUS
  Filled 2020-07-02: qty 10

## 2020-07-02 MED ORDER — DIPHENHYDRAMINE HCL 50 MG/ML IJ SOLN
50.0000 mg | Freq: Once | INTRAMUSCULAR | Status: AC
  Administered 2020-07-02: 50 mg via INTRAVENOUS
  Filled 2020-07-02: qty 1

## 2020-07-02 MED ORDER — ACETAMINOPHEN 325 MG PO TABS
650.0000 mg | ORAL_TABLET | Freq: Once | ORAL | Status: AC
  Administered 2020-07-02: 650 mg via ORAL
  Filled 2020-07-02: qty 2

## 2020-07-02 MED ORDER — SODIUM CHLORIDE 0.9 % IV SOLN
420.0000 mg | Freq: Once | INTRAVENOUS | Status: AC
  Administered 2020-07-02: 420 mg via INTRAVENOUS
  Filled 2020-07-02: qty 14

## 2020-07-02 MED ORDER — FAMOTIDINE 20 MG IN NS 100 ML IVPB
20.0000 mg | Freq: Once | INTRAVENOUS | Status: AC
  Administered 2020-07-02: 20 mg via INTRAVENOUS
  Filled 2020-07-02: qty 100
  Filled 2020-07-02: qty 20

## 2020-07-02 NOTE — Progress Notes (Signed)
Calcium 8.6; ok to give xgeva per MD.

## 2020-07-02 NOTE — Progress Notes (Signed)
Pt doing good. She is eating and drinking good. She did have some left charley horse left foot and it is on right side of foot. She also was feeling bottom of right foot-tingling.

## 2020-07-02 NOTE — Progress Notes (Signed)
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 History obtained with the help of an interpreter.Patient self palpated left breast mass which was followed by a diagnostic bilateral mammogram. Mammogram showed 3.1 x 2.9 x 1.9 cm hypoechoic mass at the 1 o'clock position of the left breast. For abnormal cortically thickened left axillary lymph nodes measuring up to 5 mm. 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 Patient was also having ongoing back pain and was seen by [facility] orthopedics Dr. Doyle Askew who ordered MRI lumbar spine without contrast which showed possible pathologic fractures of L1 and L4 vertebral bodies with greater than 50% height loss at L1 and abnormal signal involving L2-L3 S1 as well as right iliac bone concerning for metastatic disease.Patient is a single mother of 3 adult children and is very anxious today.She reports significant back pain which radiates to her bilateral thighs. Denies any focal tingling numbness or weakness. Denies any bowel bladder incontinence. Pain has been uncontrolled despite taking Tylenol. No prior history of abnormal breast biopsies. No family history  of breast cancer  PET and MRIshowed 3 areas of pathologic fracture of her spine as well as widespread bony metastatic disease and concern for impending fracture of the right hip. Given her worsening pain she was asked to come to the ER. She has been evaluated by Dr. Rudene Christians from orthopedic surgery and underwent kyphoplasty at 3 different levels.T6 L1 and L4 along with radiofrequency ablation.She also underwent prophylactic fixation of the right hip and not affected the sacral region.   Patient received first dose of Herceptin and Perjeta on 09/04/2019. Baseline echocardiogram normal.she is currently getting taxol/ herceptin/perjeta  Patient admitted to hospital for acute abdominal pain with CT findings concerning for acute colitis. Colonoscopy showed diffuse severe inflammation with erythema friability and loss of vascularity and shallow ulcerations in the IC valve, ascending colon and cecum.  Interval history-patient reports that her hip pain is presently stable.  She is not having any abdominal pain or diarrhea presently.  Denies any tingling numbness in her hands and feet  ECOG PS- 1 Pain scale- 3 Opioid associated constipation- no  Review of systems- Review of Systems  Constitutional: Positive for malaise/fatigue. Negative for chills, fever and weight loss.  HENT: Negative for congestion, ear discharge and nosebleeds.   Eyes: Negative for blurred vision.  Respiratory: Negative for cough, hemoptysis, sputum production, shortness of breath and wheezing.   Cardiovascular: Negative for chest pain, palpitations, orthopnea and claudication.  Gastrointestinal: Negative for abdominal pain, blood in stool, constipation, diarrhea, heartburn, melena, nausea and vomiting.  Genitourinary: Negative for dysuria, flank pain, frequency, hematuria and urgency.  Musculoskeletal: Negative for back pain, joint pain and myalgias.       Right hip pain  Skin: Negative  for rash.  Neurological:  Negative for dizziness, tingling, focal weakness, seizures, weakness and headaches.  Endo/Heme/Allergies: Does not bruise/bleed easily.  Psychiatric/Behavioral: Negative for depression and suicidal ideas. The patient does not have insomnia.       No Known Allergies   Past Medical History:  Diagnosis Date  . Anxiety   . Breast cancer ([place])    with mets  . Cancer ([place])   . Colitis   . Family history of colon cancer   . Vertigo      Past Surgical History:  Procedure Laterality Date  . BREAST BIOPSY Left 08/14/2019   [place] bx of mass, coil marker, path pending  . BREAST BIOPSY Left 08/14/2019   [place] bx of LN, hydromarker, path pending  . BREAST BIOPSY Left 08/14/2019   affirm bx of calcs, x marker, path pending  . ESOPHAGOGASTRODUODENOSCOPY (EGD) WITH PROPOFOL N/A 10/05/2019   Procedure: ESOPHAGOGASTRODUODENOSCOPY (EGD) WITH PROPOFOL;  Surgeon: Lin Landsman, MD;  Location: [place];  Service: Gastroenterology;  Laterality: N/A;  . FLEXIBLE SIGMOIDOSCOPY N/A 10/05/2019   Procedure: FLEXIBLE SIGMOIDOSCOPY;  Surgeon: Lin Landsman, MD;  Location: [facility] ENDOSCOPY;  Service: Gastroenterology;  Laterality: N/A;  . INTRAMEDULLARY (IM) NAIL INTERTROCHANTERIC Right 09/01/2019   Procedure: INTRAMEDULLARY (IM) NAIL INTERTROCHANTRIC AND RADIOFREQUENCY ABLATION;  Surgeon: Hessie Knows, MD;  Location: [facility] ORS;  Service: Orthopedics;  Laterality: Right;  . KYPHOPLASTY N/A 08/29/2019   Procedure: KYPHOPLASTY T6, L1,L4 ,  RADIOFREQUENCY ABLATION;  Surgeon: Hessie Knows, MD;  Location: [facility] ORS;  Service: Orthopedics;  Laterality: N/A;  . KYPHOPLASTY Right 09/01/2019   Procedure: Right Sacral Radiofrequency Ablation and Cement Augmentation, Right sacrum and iliac crest;  Surgeon: Hessie Knows, MD;  Location: [facility] ORS;  Service: Orthopedics;  Laterality: Right;  . PORTA CATH INSERTION N/A 08/28/2019   Procedure: PORTA CATH INSERTION;  Surgeon: Algernon Huxley, MD;  Location: [place] CV  LAB;  Service: Cardiovascular;  Laterality: N/A;    Social History   Socioeconomic History  . Marital status: Single    Spouse name: Not on file  . Number of children: Not on file  . Years of education: Not on file  . Highest education level: Not on file  Occupational History  . Not on file  Tobacco Use  . Smoking status: Never Smoker  . Smokeless tobacco: Never Used  Vaping Use  . Vaping Use: Never used  Substance and Sexual Activity  . Alcohol use: Not Currently  . Drug use: Not Currently  . Sexual activity: Not Currently    Birth control/protection: None  Other Topics Concern  . Not on file  Social History Narrative   Lives at home with children   Social Determinants of Health   Financial Resource Strain: Not on file  Food Insecurity: Not on file  Transportation Needs: Not on file  Physical Activity: Not on file  Stress: Not on file  Social Connections: Not on file  Intimate Partner Violence: Not on file    Family History  Problem Relation Age of Onset  . Colon cancer Maternal Uncle      Current Outpatient Medications:  .  clindamycin (CLINDAGEL) 1 % gel, Apply topically 2 (two) times daily. X 7 days., Disp: 30 g, Rfl: 0 .  FLUoxetine (PROZAC) 10 MG capsule, Take 1 capsule (10 mg total) by mouth daily., Disp: 30 capsule, Rfl: 3 .  gabapentin (NEURONTIN) 300 MG capsule, Take 1 capsule (300 mg total) by mouth 3 (three) times daily., Disp: 180 capsule,  Rfl: 3 .  lidocaine-prilocaine (EMLA) cream, Apply 1 application topically as needed. Apply small amount to port site at least 1 hour prior to it being accessed, cover with plastic wrap, Disp: 30 g, Rfl: 1 .  oxycodone (OXY-IR) 5 MG capsule, Take 1-2 capsules (5-10 mg total) by mouth every 4 (four) hours as needed., Disp: 90 capsule, Rfl: 0 .  oxyCODONE (OXYCONTIN) 10 mg 12 hr tablet, Take 1 tablet (10 mg total) by mouth every 12 (twelve) hours., Disp: 60 tablet, Rfl: 0 .  diphenoxylate-atropine (LOMOTIL) 2.5-0.025  MG tablet, Take 1 tablet by mouth 4 (four) times daily as needed for diarrhea or loose stools. (Patient not taking: Reported on 07/02/2020), Disp: 40 tablet, Rfl: 0 No current facility-administered medications for this visit.  Facility-Administered Medications Ordered in Other Visits:  .  0.9 %  sodium chloride infusion, , Intravenous, Continuous, Burns, Wandra Feinstein, NP, Stopped at 06/02/20 1453 .  heparin lock flush 100 unit/mL, 500 Units, Intravenous, Once, Sindy Guadeloupe, MD .  PACLitaxel (TAXOL) 108 mg in sodium chloride 0.9 % 250 mL chemo infusion (</= 40m/m2), 65 mg/m2 (Treatment Plan Recorded), Intravenous, Once, RSindy Guadeloupe MD .  pertuzumab (PERJETA) 420 mg in sodium chloride 0.9 % 250 mL chemo infusion, 420 mg, Intravenous, Once, RSindy Guadeloupe MD .  tTheotis Burrow([facility] 357 mg in sodium chloride 0.9 % 250 mL chemo infusion, 6 mg/kg (Treatment Plan Recorded), Intravenous, Once, RSindy Guadeloupe MD, Last Rate: 534 mL/hr at 07/02/20 1232, 357 mg at 07/02/20 1232  Physical exam:  Vitals:   07/02/20 1014  BP: 120/81  Pulse: 78  Resp: 16  Temp: 98.1 F (36.7 C)  TempSrc: Tympanic  Weight: 135 lb 11.2 oz (61.6 kg)  Height: 5' (1.524 m)   Physical Exam Cardiovascular:     Rate and Rhythm: Normal rate.  Pulmonary:     Effort: Pulmonary effort is normal.     Breath sounds: Normal breath sounds.  Abdominal:     General: Bowel sounds are normal.     Palpations: Abdomen is soft.  Skin:    General: Skin is warm and dry.  Neurological:     Mental Status: She is alert and oriented to person, place, and time.      CMP Latest Ref Rng & Units 07/02/2020  Glucose 70 - 99 mg/dL 102(H)  BUN 6 - 20 mg/dL 15  Creatinine 0.44 - 1.00 mg/dL 0.48  Sodium 135 - 145 mmol/L 139  Potassium 3.5 - 5.1 mmol/L 3.4(L)  Chloride 98 - 111 mmol/L 105  CO2 22 - 32 mmol/L 24  Calcium 8.9 - 10.3 mg/dL 8.6(L)  Total Protein 6.5 - 8.1 g/dL 6.9  Total Bilirubin 0.3 - 1.2 mg/dL 0.6  Alkaline  Phos 38 - 126 U/L 59  AST 15 - 41 U/L 36  ALT 0 - 44 U/L 20   CBC Latest Ref Rng & Units 07/02/2020  WBC 4.0 - 10.5 K/uL 5.7  Hemoglobin 12.0 - 15.0 g/dL 10.5(L)  Hematocrit 36.0 - 46.0 % 32.8(L)  Platelets 150 - 400 K/uL 401(H)    No images are attached to the encounter.  NM Bone Scan Whole Body  Result Date: 07/01/2020 CLINICAL DATA:  Metastatic breast cancer with bone metastases, history and deficiency anemia, RIGHT hip fracture with surgery June 2021, kyphoplasties of T6, L1 and L4 in June 2021, fell 4 weeks ago striking RIGHT hip EXAM: NUCLEAR MEDICINE WHOLE BODY BONE SCAN TECHNIQUE: Whole body anterior and posterior images were obtained  approximately 3 hours after intravenous injection of radiopharmaceutical. RADIOPHARMACEUTICALS:  21.785 mCi Technetium-10mMDP IV COMPARISON:  04/01/2020 Correlation: CT abdomen and pelvis 06/03/2020, RIGHT hip radiographs 06/07/2020, LEFT hip radiographs 06/22/2020 FINDINGS: Abnormal sites of tracer uptake in the LEFT skull base, posterior RIGHT second rib, anterior LEFT third rib, and BILATERAL pelvis consistent with osseous metastatic disease. Uptake at the posterior RIGHT second rib appears progressive since the previous exam. Uptake in the upper lumbar spine at L1 corresponding to kyphoplasty. Scattered uptake at costovertebral junction of thoracic spine and at RIGHT L5-S1 likely degenerative. Uptake within RIGHT femur corresponding to healing fracture and postsurgical changes. Uptake in the posterior RIGHT second rib appears slightly increased. No new sites of osseous metastasis identified. Expected urinary tract and soft tissue distribution of tracer. IMPRESSION: Stable distribution of osseous metastases though the uptake at the posterior RIGHT second rib has slightly increased. No new sites of osseous metastasis identified. Uptake in RIGHT femur consistent with healing fracture and postsurgical changes. Electronically Signed   By: MLavonia DanaM.D.   On:  07/01/2020 16:29   CT Abdomen Pelvis W Contrast  Result Date: 06/03/2020 CLINICAL DATA:  Concern for gastroenteritis. Abdominal pain. History of breast cancer. Chemotherapy ongoing. EXAM: CT ABDOMEN AND PELVIS WITH CONTRAST TECHNIQUE: Multidetector CT imaging of the abdomen and pelvis was performed using the standard protocol following bolus administration of intravenous contrast. CONTRAST:  1011mOMNIPAQUE IOHEXOL 300 MG/ML  SOLN COMPARISON:  CT 04/01/2020 FINDINGS: Lower chest: Lung bases are clear. Hepatobiliary: No focal hepatic lesion. No biliary duct dilatation. Common bile duct is normal. Pancreas: Pancreas is normal. No ductal dilatation. No pancreatic inflammation. Spleen: Normal spleen Adrenals/urinary tract: Adrenal glands and kidneys are normal. The ureters and bladder normal. Stomach/Bowel: Stomach, duodenum small-bowel normal. No evidence of small bowel inflammation or obstruction. Terminal ileum is normal. Appendix is normal. The entire colon is collapsed. Sigmoid colon and rectum are collapsed. No clear evidence of colonic inflammation. No mesenteric inflammation. Vascular/Lymphatic: Abdominal aorta is normal caliber. No periportal or retroperitoneal adenopathy. No pelvic adenopathy. Reproductive: Uterus and adnexa unremarkable. Other: No free fluid.  No peritoneal disease Musculoskeletal: No aggressive osseous lesion. Chronic compression fracture of the lumbar spine with augmentation. Round sclerotic lesions at T12 and T10 are not changed from comparison CT. Additional smaller lesions in the lumbar spine. The lesions are not hypermetabolic on comparison PET-CT scan 12/16/2019. Sacral plasty in the RIGHT sacral ala. IMPRESSION: 1. No evidence of inflammation or obstruction the small bowel or colon. 2. Entire colon is collapsed without stool. 3. Gallbladder appendix are normal. 4. Round sclerotic skeletal lesions again noted. No interval change. Electronically Signed   By: StSuzy Bouchard.D.    On: 06/03/2020 11:30   ECHOCARDIOGRAM COMPLETE  Result Date: 06/24/2020    ECHOCARDIOGRAM REPORT   [facility] Name[facility]   [facility]2020 Medical Rec #:  [id number]           Height:       60.0 in Accession #:    [id number]          Weight:       135.5 lb Date of Birth:  11/1980/11/11           BSA:          1.582 m Patient Age:    39 years              BP:  96/61 mmHg Patient Gender: F                     HR:           90 bpm. Exam Location:  [facility] Procedure: 2D Echo, Strain Analysis, Color Doppler and Cardiac Doppler Indications:     Chemo Z09  History:         Patient has prior history of Echocardiogram examinations, most                  recent 03/23/2020. Anxiety, breast cancer.  Sonographer:     Sherrie Sport RDCS (AE) Referring Phys:  [id number] Weston Anna Corrisa Gibby Diagnosing Phys: Ida Rogue MD  Sonographer Comments: Suboptimal apical window. Global longitudinal strain was attempted. IMPRESSIONS  1. Left ventricular ejection fraction, by estimation, is 60 to 65%. The left ventricle has normal function. The left ventricle has no regional wall motion abnormalities. Left ventricular diastolic parameters were normal.  2. Right ventricular systolic function is normal. The right ventricular size is normal. There is normal pulmonary artery systolic pressure. The estimated right ventricular systolic pressure is 53.6 mmHg. FINDINGS  Left Ventricle: Left ventricular ejection fraction, by estimation, is 60 to 65%. The left ventricle has normal function. The left ventricle has no regional wall motion abnormalities. Global longitudinal strain performed but not reported based on interpreter judgement due to suboptimal tracking. The left ventricular internal cavity size was normal in size. There is no left ventricular hypertrophy. Left ventricular diastolic parameters were normal. Right Ventricle: The right ventricular size is normal. No increase in right ventricular wall thickness. Right  ventricular systolic function is normal. There is normal pulmonary artery systolic pressure. The tricuspid regurgitant velocity is 2.55 m/s, and  with an assumed right atrial pressure of 3 mmHg, the estimated right ventricular systolic pressure is 64.4 mmHg. Left Atrium: Left atrial size was normal in size. Right Atrium: Right atrial size was normal in size. Pericardium: There is no evidence of pericardial effusion. Mitral Valve: The mitral valve is normal in structure. Trivial mitral valve regurgitation. No evidence of mitral valve stenosis. Tricuspid Valve: The tricuspid valve is normal in structure. Tricuspid valve regurgitation is mild . No evidence of tricuspid stenosis. Aortic Valve: The aortic valve was not well visualized. Aortic valve regurgitation is not visualized. No aortic stenosis is present. Aortic valve mean gradient measures 4.5 mmHg. Aortic valve peak gradient measures 7.0 mmHg. Aortic valve area, by VTI measures 2.10 cm. Pulmonic Valve: The pulmonic valve was normal in structure. Pulmonic valve regurgitation is not visualized. No evidence of pulmonic stenosis. Aorta: The aortic root is normal in size and structure. Venous: The inferior vena cava is normal in size with greater than 50% respiratory variability, suggesting right atrial pressure of 3 mmHg. IAS/Shunts: No atrial level shunt detected by color flow Doppler.  LEFT VENTRICLE PLAX 2D LVIDd:         3.80 cm  Diastology LVIDs:         2.27 cm  LV e' medial:    8.38 cm/s LV PW:         0.72 cm  LV E/e' medial:  9.0 LV IVS:        0.93 cm  LV e' lateral:   14.30 cm/s LVOT diam:     2.00 cm  LV E/e' lateral: 5.3 LV SV:         52 LV SV Index:   33 LVOT Area:     3.14 cm  RIGHT VENTRICLE RV Basal diam:  3.34 cm RV S prime:     14.10 cm/s TAPSE (M-mode): 3.5 cm LEFT ATRIUM             Index       RIGHT ATRIUM           Index LA diam:        3.20 cm 2.02 cm/m  RA Area:     17.45 cm LA Vol (A2C):   63.2 ml 39.95 ml/m RA Volume:   50.10 ml   31.67 ml/m LA Vol (A4C):   38.5 ml 24.34 ml/m LA Biplane Vol: 49.3 ml 31.16 ml/m  AORTIC VALVE                    PULMONIC VALVE AV Area (Vmax):    1.85 cm     PV Vmax:        0.86 m/s AV Area (Vmean):   1.77 cm     PV Peak grad:   3.0 mmHg AV Area (VTI):     2.10 cm     RVOT Peak grad: 2 mmHg AV Vmax:           132.00 cm/s AV Vmean:          100.250 cm/s AV VTI:            0.250 m AV Peak Grad:      7.0 mmHg AV Mean Grad:      4.5 mmHg LVOT Vmax:         77.70 cm/s LVOT Vmean:        56.500 cm/s LVOT VTI:          0.167 m LVOT/AV VTI ratio: 0.67  AORTA Ao Root diam: 2.60 cm MITRAL VALVE               TRICUSPID VALVE MV Area (PHT): 4.06 cm    TR Peak grad:   26.0 mmHg MV Decel Time: 187 msec    TR Vmax:        255.00 cm/s MV E velocity: 75.80 cm/s MV A velocity: 69.00 cm/s  SHUNTS MV E/A ratio:  1.10        Systemic VTI:  0.17 m                            Systemic Diam: 2.00 cm Ida Rogue MD Electronically signed by Ida Rogue MD Signature Date/Time: 06/24/2020/5:45:42 PM    Final    DG HIP UNILAT WITH PELVIS 2-3 VIEWS LEFT  Result Date: 06/22/2020 CLINICAL DATA:  Fall.  Hip pain. EXAM: DG HIP (WITH OR WITHOUT PELVIS) 2-3V LEFT COMPARISON:  06/07/2020.  CT 06/07/2020. FINDINGS: Postsurgical changes right hip. Postsurgical changes noted about the lower lumbar spine, right sacrum, right ilium. Stable sclerotic focus left iliac bone just above the acetabulum. Again metastatic disease could present in this fashion. Left hip is intact. No evidence of left hip fracture or dislocation. IMPRESSION: 1. Postsurgical changes right hip. Postsurgical changes about the lower lumbar spine, right sacrum, right ilium. 2. Stable sclerotic focus left iliac bone just above the acetabulum. Again metastatic disease could present this fashion. 3.  Left hip is unremarkable.  No acute abnormality noted. Electronically Signed   By: Marcello Moores  Register   On: 06/22/2020 12:35   DG HIP UNILAT WITH PELVIS 2-3 VIEWS RIGHT  Result  Date: 06/07/2020 CLINICAL DATA:  Fall last Thursday with posterolateral right  hip pain. Metastatic breast cancer. EXAM: DG HIP (WITH OR WITHOUT PELVIS) 2-3V RIGHT COMPARISON:  08/28/2019 right hip radiographs FINDINGS: Intramedullary rod in the right femoral shaft with interlocking right femoral neck pin and distal interlocking screw with no evidence of hardware fracture or loosening. Linear cortical discontinuity in the anterior right proximal femoral metaphysis on the lateral view, cannot exclude a small nondisplaced periprosthetic fracture. No right hip dislocation. Otherwise no potential acute pelvic fracture. Dense material in the upper right sacrum and medial right iliac bone. Sclerotic lesion in the supra-acetabular peripheral left iliac bone as seen on recent CT abdomen/pelvis study. IMPRESSION: 1. Linear cortical discontinuity in the anterior right proximal femoral metaphysis on the lateral view, cannot exclude a small nondisplaced periprosthetic fracture. Right hip CT may be obtained for further evaluation as clinically warranted. 2. Sclerotic lesion in supra-acetabular left iliac bone as seen on recent CT abdomen study, compatible with metastasis. Electronically Signed   By: Ilona Sorrel M.D.   On: 06/07/2020 15:09     Assessment and plan- Patient is a 40 y.o. female  with metastatic ER/PR negative HER-2 positive breast cancer with bone metastases. She is here for on treatment assessment prior to cycle 15-day 1 of Taxol Herceptin and Perjeta  Patient's diarrhea has resolved.  She is not on oral vancomycin anymore.  She was also seen by GI Dr. Marius Ditch and plan is to hold off any continued treatment for C. difficile for the moment.  If diarrhea recurs I will send her back to GI.  Counts are otherwise okay to proceed with cycle 15-day 1 of Taxol Herceptin and Perjeta today.  She will proceed for Taxol alone next week and I will see her back in 3 weeks for cycle 16-day 1.  Recent CT scans and bone  scans have showed stable disease so far.  Insomnia: Continue trazodone  Neoplasm related pain: Continue oxycodone  Bone metastases: Will receive Xgeva today   Visit Diagnosis 1. Encounter for monoclonal antibody treatment for malignancy   2. Encounter for antineoplastic chemotherapy   3. Bone metastases ([place])   4. Metastatic breast cancer ([place])   5. Encounter for monitoring denosumab therapy   6. Neoplasm related pain      Dr. Randa Evens, MD, MPH [facility] at [facility] [phone] 07/02/2020 12:54 PM

## 2020-07-09 ENCOUNTER — Inpatient Hospital Stay: Payer: Self-pay

## 2020-07-09 VITALS — BP 101/63 | HR 74 | Temp 97.2°F | Resp 16 | Wt 133.5 lb

## 2020-07-09 DIAGNOSIS — M8440XA Pathological fracture, unspecified site, initial encounter for fracture: Secondary | ICD-10-CM

## 2020-07-09 DIAGNOSIS — E559 Vitamin D deficiency, unspecified: Secondary | ICD-10-CM

## 2020-07-09 DIAGNOSIS — C50919 Malignant neoplasm of unspecified site of unspecified female breast: Secondary | ICD-10-CM

## 2020-07-09 LAB — CBC WITH DIFFERENTIAL/PLATELET
Abs Immature Granulocytes: 0.04 10*3/uL (ref 0.00–0.07)
Basophils Absolute: 0 10*3/uL (ref 0.0–0.1)
Basophils Relative: 1 %
Eosinophils Absolute: 0.2 10*3/uL (ref 0.0–0.5)
Eosinophils Relative: 3 %
HCT: 31.6 % — ABNORMAL LOW (ref 36.0–46.0)
Hemoglobin: 10.1 g/dL — ABNORMAL LOW (ref 12.0–15.0)
Immature Granulocytes: 1 %
Lymphocytes Relative: 25 %
Lymphs Abs: 1.3 10*3/uL (ref 0.7–4.0)
MCH: 26.2 pg (ref 26.0–34.0)
MCHC: 32 g/dL (ref 30.0–36.0)
MCV: 81.9 fL (ref 80.0–100.0)
Monocytes Absolute: 0.2 10*3/uL (ref 0.1–1.0)
Monocytes Relative: 4 %
Neutro Abs: 3.6 10*3/uL (ref 1.7–7.7)
Neutrophils Relative %: 66 %
Platelets: 386 10*3/uL (ref 150–400)
RBC: 3.86 MIL/uL — ABNORMAL LOW (ref 3.87–5.11)
RDW: 17.2 % — ABNORMAL HIGH (ref 11.5–15.5)
WBC: 5.4 10*3/uL (ref 4.0–10.5)
nRBC: 0 % (ref 0.0–0.2)

## 2020-07-09 LAB — COMPREHENSIVE METABOLIC PANEL
ALT: 16 U/L (ref 0–44)
AST: 27 U/L (ref 15–41)
Albumin: 3.5 g/dL (ref 3.5–5.0)
Alkaline Phosphatase: 56 U/L (ref 38–126)
Anion gap: 8 (ref 5–15)
BUN: 10 mg/dL (ref 6–20)
CO2: 23 mmol/L (ref 22–32)
Calcium: 8.7 mg/dL — ABNORMAL LOW (ref 8.9–10.3)
Chloride: 106 mmol/L (ref 98–111)
Creatinine, Ser: 0.45 mg/dL (ref 0.44–1.00)
GFR, Estimated: >60 mL/min (ref >60)
Glucose, Bld: 158 mg/dL — ABNORMAL HIGH (ref 70–99)
Potassium: 3.5 mmol/L (ref 3.5–5.1)
Sodium: 137 mmol/L (ref 135–145)
Total Bilirubin: 0.5 mg/dL (ref 0.3–1.2)
Total Protein: 6.6 g/dL (ref 6.5–8.1)

## 2020-07-09 LAB — VITAMIN D 25 HYDROXY (VIT D DEFICIENCY, FRACTURES): Vit D, 25-Hydroxy: 14.16 ng/mL — ABNORMAL LOW (ref 30–100)

## 2020-07-09 MED ORDER — DIPHENHYDRAMINE HCL 50 MG/ML IJ SOLN
50.0000 mg | Freq: Once | INTRAMUSCULAR | Status: AC
Start: 2020-07-09 — End: 2020-07-09
  Administered 2020-07-09: 50 mg via INTRAVENOUS
  Filled 2020-07-09: qty 1

## 2020-07-09 MED ORDER — FAMOTIDINE 20 MG IN NS 100 ML IVPB
20.0000 mg | Freq: Once | INTRAVENOUS | Status: AC
  Administered 2020-07-09: 20 mg via INTRAVENOUS
  Filled 2020-07-09: qty 100
  Filled 2020-07-09: qty 20

## 2020-07-09 MED ORDER — SODIUM CHLORIDE 0.9 % IV SOLN
20.0000 mg | Freq: Once | INTRAVENOUS | Status: AC
  Administered 2020-07-09: 20 mg via INTRAVENOUS
  Filled 2020-07-09: qty 20

## 2020-07-09 MED ORDER — SODIUM CHLORIDE 0.9 % IV SOLN
Freq: Once | INTRAVENOUS | Status: AC
  Filled 2020-07-09: qty 250

## 2020-07-09 MED ORDER — HEPARIN SOD (PORK) LOCK FLUSH 100 UNIT/ML IV SOLN
500.0000 [IU] | Freq: Once | INTRAVENOUS | Status: AC | PRN
  Administered 2020-07-09: 500 [IU]
  Filled 2020-07-09: qty 5

## 2020-07-09 MED ORDER — SODIUM CHLORIDE 0.9 % IV SOLN
65.0000 mg/m2 | Freq: Once | INTRAVENOUS | Status: AC
  Administered 2020-07-09: 108 mg via INTRAVENOUS
  Filled 2020-07-09: qty 18

## 2020-07-12 ENCOUNTER — Other Ambulatory Visit: Payer: Self-pay

## 2020-07-12 ENCOUNTER — Telehealth: Payer: Self-pay

## 2020-07-12 DIAGNOSIS — C50919 Malignant neoplasm of unspecified site of unspecified female breast: Secondary | ICD-10-CM

## 2020-07-12 MED ORDER — ERGOCALCIFEROL 1.25 MG (50000 UT) PO CAPS: 50000.0000 [IU] | ORAL_CAPSULE | ORAL | 0 refills | Status: DC

## 2020-07-12 NOTE — Telephone Encounter (Signed)
Phoned patient to f/u with symptoms reported to [place]. Pt states that she feels lightheaded and feels like her heart is beating fast. States that is not constant, only "sometimes". States she had nausea and vomiting yesterday. Offered [facility] visit this afternoon, but pt not able to get here. She did agree to [facility] visit in the AM on 4/26. Pt scheduled for 1015 port labs and [facility] with [place]. Pt verbalized understanding.

## 2020-07-12 NOTE — Progress Notes (Signed)
Rx sent to pharmacy pt notified,also states she has been very lighted head and having a fast heartbeat.

## 2020-07-13 ENCOUNTER — Inpatient Hospital Stay (HOSPITAL_BASED_OUTPATIENT_CLINIC_OR_DEPARTMENT_OTHER): Payer: Self-pay | Admitting: Hospice and Palliative Medicine

## 2020-07-13 ENCOUNTER — Inpatient Hospital Stay: Payer: Self-pay

## 2020-07-13 ENCOUNTER — Other Ambulatory Visit: Payer: Self-pay

## 2020-07-13 VITALS — BP 106/77 | HR 71 | Temp 95.1°F | Resp 16

## 2020-07-13 VITALS — BP 118/80 | HR 76 | Temp 96.7°F | Resp 16

## 2020-07-13 DIAGNOSIS — E860 Dehydration: Secondary | ICD-10-CM

## 2020-07-13 DIAGNOSIS — Z515 Encounter for palliative care: Secondary | ICD-10-CM

## 2020-07-13 DIAGNOSIS — C50919 Malignant neoplasm of unspecified site of unspecified female breast: Secondary | ICD-10-CM

## 2020-07-13 DIAGNOSIS — R519 Headache, unspecified: Secondary | ICD-10-CM

## 2020-07-13 DIAGNOSIS — R110 Nausea: Secondary | ICD-10-CM

## 2020-07-13 LAB — COMPREHENSIVE METABOLIC PANEL
ALT: 15 U/L (ref 0–44)
AST: 19 U/L (ref 15–41)
Albumin: 3.5 g/dL (ref 3.5–5.0)
Alkaline Phosphatase: 51 U/L (ref 38–126)
Anion gap: 7 (ref 5–15)
BUN: 10 mg/dL (ref 6–20)
CO2: 24 mmol/L (ref 22–32)
Calcium: 8.8 mg/dL — ABNORMAL LOW (ref 8.9–10.3)
Chloride: 106 mmol/L (ref 98–111)
Creatinine, Ser: 0.38 mg/dL — ABNORMAL LOW (ref 0.44–1.00)
GFR, Estimated: >60 mL/min (ref >60)
Glucose, Bld: 102 mg/dL — ABNORMAL HIGH (ref 70–99)
Potassium: 3.7 mmol/L (ref 3.5–5.1)
Sodium: 137 mmol/L (ref 135–145)
Total Bilirubin: 0.6 mg/dL (ref 0.3–1.2)
Total Protein: 6.7 g/dL (ref 6.5–8.1)

## 2020-07-13 LAB — CBC WITH DIFFERENTIAL/PLATELET
Abs Immature Granulocytes: 0.01 10*3/uL (ref 0.00–0.07)
Basophils Absolute: 0 10*3/uL (ref 0.0–0.1)
Basophils Relative: 0 %
Eosinophils Absolute: 0.1 10*3/uL (ref 0.0–0.5)
Eosinophils Relative: 3 %
HCT: 31.8 % — ABNORMAL LOW (ref 36.0–46.0)
Hemoglobin: 10.1 g/dL — ABNORMAL LOW (ref 12.0–15.0)
Immature Granulocytes: 0 %
Lymphocytes Relative: 24 %
Lymphs Abs: 1 10*3/uL (ref 0.7–4.0)
MCH: 25.7 pg — ABNORMAL LOW (ref 26.0–34.0)
MCHC: 31.8 g/dL (ref 30.0–36.0)
MCV: 80.9 fL (ref 80.0–100.0)
Monocytes Absolute: 0.2 10*3/uL (ref 0.1–1.0)
Monocytes Relative: 5 %
Neutro Abs: 2.9 10*3/uL (ref 1.7–7.7)
Neutrophils Relative %: 68 %
Platelets: 381 10*3/uL (ref 150–400)
RBC: 3.93 MIL/uL (ref 3.87–5.11)
RDW: 17.5 % — ABNORMAL HIGH (ref 11.5–15.5)
WBC: 4.3 10*3/uL (ref 4.0–10.5)
nRBC: 0 % (ref 0.0–0.2)

## 2020-07-13 MED ORDER — ONDANSETRON HCL 8 MG PO TABS: 8.0000 mg | ORAL_TABLET | Freq: Three times a day (TID) | ORAL | 2 refills | Status: DC | PRN

## 2020-07-13 MED ORDER — ACETAMINOPHEN 325 MG PO TABS
650.0000 mg | ORAL_TABLET | Freq: Four times a day (QID) | ORAL | Status: DC | PRN
  Administered 2020-07-13: 650 mg via ORAL
  Filled 2020-07-13: qty 2

## 2020-07-13 MED ORDER — TRAZODONE HCL 50 MG PO TABS: 50.0000 mg | ORAL_TABLET | Freq: Every evening | ORAL | 2 refills | Status: DC | PRN

## 2020-07-13 MED ORDER — ONDANSETRON HCL 4 MG/2ML IJ SOLN
4.0000 mg | Freq: Once | INTRAMUSCULAR | Status: AC
  Administered 2020-07-13: 4 mg via INTRAVENOUS
  Filled 2020-07-13: qty 2

## 2020-07-13 MED ORDER — SODIUM CHLORIDE 0.9% FLUSH
10.0000 mL | Freq: Once | INTRAVENOUS | Status: AC
Start: 2020-07-13 — End: 2020-07-13
  Administered 2020-07-13: 10 mL via INTRAVENOUS
  Filled 2020-07-13: qty 10

## 2020-07-13 MED ORDER — SODIUM CHLORIDE 0.9 % IV SOLN
INTRAVENOUS | Status: DC
  Filled 2020-07-13 (×2): qty 250

## 2020-07-13 MED ORDER — HEPARIN SOD (PORK) LOCK FLUSH 100 UNIT/ML IV SOLN
500.0000 [IU] | Freq: Once | INTRAVENOUS | Status: AC
  Administered 2020-07-13: 500 [IU] via INTRAVENOUS
  Filled 2020-07-13: qty 5

## 2020-07-13 MED ORDER — PROCHLORPERAZINE MALEATE 10 MG PO TABS: 10.0000 mg | ORAL_TABLET | Freq: Four times a day (QID) | ORAL | 2 refills | Status: DC | PRN

## 2020-07-13 NOTE — Progress Notes (Signed)
[place]  Telephone:([phone] [phone]   Name: Sophia [name]: 07/13/2020 MRN: [id number]  DOB: 25-Mar-1980  Patient Care Team: [facility], [place], NP as PCP - General Rico Junker, RN as Registered Nurse Theodore Demark, RN as Registered Nurse Sindy Guadeloupe, MD as Consulting Physician (Hematology and Oncology)    REASON FOR CONSULTATION: Sophia [name] is a 40 y.o. 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  Patient has history of pathologic fracture to spine status post kyphoplasty.  She also underwent prophylactic fixation of the right hip due to impending fracture.  She has had significant depression and anxiety.  She was referred to palliative care to help address goals and manage ongoing symptoms.  SOCIAL HISTORY:     reports that she has never smoked. She has never used smokeless tobacco. She reports previous alcohol use. She reports previous drug use.  Patient is from [place].  She speaks [place].  She lives here with her 3 children ages 29-15.  She has no family or friend/social support locally.  She is unable to work.  ADVANCE DIRECTIVES:  Does not have  CODE STATUS:   PAST MEDICAL HISTORY: Past Medical History:  Diagnosis [name]  . Anxiety   . Breast cancer ([place])    with mets  . Cancer ([place])   . Colitis   . Family history of colon cancer   . Vertigo     PAST SURGICAL HISTORY:  Past Surgical History:  Procedure Laterality [name]  . BREAST BIOPSY Left 08/14/2019   [place] bx of mass, coil marker, path pending  . BREAST BIOPSY Left 08/14/2019   [place] bx of LN, hydromarker, path pending  . BREAST BIOPSY Left 08/14/2019   affirm bx of calcs, x marker, path pending  . ESOPHAGOGASTRODUODENOSCOPY (EGD) WITH PROPOFOL N/A 10/05/2019   Procedure: ESOPHAGOGASTRODUODENOSCOPY (EGD) WITH PROPOFOL;  Surgeon:  Lin Landsman, MD;  Location: [facility];  Service: Gastroenterology;  Laterality: N/A;  . FLEXIBLE SIGMOIDOSCOPY N/A 10/05/2019   Procedure: FLEXIBLE SIGMOIDOSCOPY;  Surgeon: Lin Landsman, MD;  Location: [facility] ENDOSCOPY;  Service: Gastroenterology;  Laterality: N/A;  . INTRAMEDULLARY (IM) NAIL INTERTROCHANTERIC Right 09/01/2019   Procedure: INTRAMEDULLARY (IM) NAIL INTERTROCHANTRIC AND RADIOFREQUENCY ABLATION;  Surgeon: Hessie Knows, MD;  Location: [facility] ORS;  Service: Orthopedics;  Laterality: Right;  . KYPHOPLASTY N/A 08/29/2019   Procedure: KYPHOPLASTY T6, L1,L4 ,  RADIOFREQUENCY ABLATION;  Surgeon: Hessie Knows, MD;  Location: [facility] ORS;  Service: Orthopedics;  Laterality: N/A;  . KYPHOPLASTY Right 09/01/2019   Procedure: Right Sacral Radiofrequency Ablation and Cement Augmentation, Right sacrum and iliac crest;  Surgeon: Hessie Knows, MD;  Location: [facility] ORS;  Service: Orthopedics;  Laterality: Right;  . PORTA CATH INSERTION N/A 08/28/2019   Procedure: PORTA CATH INSERTION;  Surgeon: Algernon Huxley, MD;  Location: [facility];  Service: Cardiovascular;  Laterality: N/A;    HEMATOLOGY/ONCOLOGY HISTORY:  Oncology History  Metastatic breast cancer ([place])  08/29/2019 Initial Diagnosis   Metastatic breast cancer ([place])   09/04/2019 -  Chemotherapy    Patient is on Treatment Plan: BREAST WEEKLY PACLITAXEL + TRASTUZUMAB + PERTUZUMAB Q21D       Genetic Testing   Negative genetic testing. No pathogenic variants identified on the [facility] CancerNext-Expanded+RNA panel. The report [name] is 04/13/2020.   The CancerNext-Expanded + RNAinsight gene panel offered by [facility] and includes sequencing and rearrangement  analysis for the following 77 genes: IP, ALK, APC*, ATM*, AXIN2, BAP1, BARD1, BLM, BMPR1A, BRCA1*, BRCA2*, BRIP1*, CDC73, CDH1*,CDK4, CDKN1B, CDKN2A, CHEK2*, CTNNA1, DICER1, FANCC, FH, FLCN, GALNT12, KIF1B, LZTR1, MAX, MEN1, MET, MLH1*, MSH2*, MSH3, MSH6*, MUTYH*, NBN,  NF1*, NF2, NTHL1, PALB2*, PHOX2B, PMS2*, POT1, PRKAR1A, PTCH1, PTEN*, RAD51C*, RAD51D*,RB1, RECQL, RET, SDHA, SDHAF2, SDHB, SDHC, SDHD, SMAD4, SMARCA4, SMARCB1, SMARCE1, STK11, SUFU, TMEM127, TP53*,TSC1, TSC2, VHL and XRCC2 (sequencing and deletion/duplication); EGFR, EGLN1, HOXB13, KIT, MITF, PDGFRA, POLD1 and POLE (sequencing only); EPCAM and GREM1 (deletion/duplication only). DNA and RNA analyses performed for * genes.     ALLERGIES:  has No Known Allergies.  MEDICATIONS:  Current Outpatient Medications  Medication Sig Dispense Refill  . clindamycin (CLINDAGEL) 1 % gel Apply topically 2 (two) times daily. X 7 days. 30 g 0  . ergocalciferol (VITAMIN D2) 1.25 MG (50000 UT) capsule Take 1 capsule (50,000 Units total) by mouth once a week. For 4 weeks. Then 1 capsule once monthly for 4 months 8 capsule 0  . FLUoxetine (PROZAC) 10 MG capsule Take 1 capsule (10 mg total) by mouth daily. 30 capsule 3  . gabapentin (NEURONTIN) 300 MG capsule Take 1 capsule (300 mg total) by mouth 3 (three) times daily. 180 capsule 3  . lidocaine-prilocaine (EMLA) cream Apply 1 application topically as needed. Apply small amount to port site at least 1 hour prior to it being accessed, cover with plastic wrap 30 g 1  . oxycodone (OXY-IR) 5 MG capsule Take 1-2 capsules (5-10 mg total) by mouth every 4 (four) hours as needed. 90 capsule 0  . oxyCODONE (OXYCONTIN) 10 mg 12 hr tablet Take 1 tablet (10 mg total) by mouth every 12 (twelve) hours. 60 tablet 0  . diphenoxylate-atropine (LOMOTIL) 2.5-0.025 MG tablet Take 1 tablet by mouth 4 (four) times daily as needed for diarrhea or loose stools. (Patient not taking: No sig reported) 40 tablet 0  . gabapentin (NEURONTIN) 300 MG capsule Take by mouth. (Patient not taking: Reported on 07/13/2020)    . potassium chloride SA (KLOR-CON) 20 MEQ tablet Take by mouth. (Patient not taking: Reported on 07/13/2020)     No current facility-administered medications for this visit.    Facility-Administered Medications Ordered in Other Visits  Medication Dose Route Frequency Provider Last Rate Last Admin  . 0.9 %  sodium chloride infusion   Intravenous Continuous Jacquelin Hawking, NP   Stopped at 06/02/20 1453  . 0.9 %  sodium chloride infusion   Intravenous Continuous Harmony Sandell, Kirt Boys, NP 999 mL/hr at 07/13/20 1141 New Bag at 07/13/20 1141  . acetaminophen (TYLENOL) tablet 650 mg  650 mg Oral Q6H PRN Takiesha Mcdevitt, Kirt Boys, NP   650 mg at 07/13/20 1206  . heparin lock flush 100 unit/mL  500 Units Intravenous Once Yahya Boldman, Kirt Boys, NP        VITAL SIGNS: BP 106/77 (BP Location: Left Arm, Patient Position: Sitting) Comment: vitals obtained by infusion  Pulse 71   Temp (!) 95.1 F (35.1 C) (Tympanic)   Resp 16  There were no vitals filed for this visit.  Estimated body mass index is 26.07 kg/m as calculated from the following:   Height as of 07/02/20: 5' (1.524 m).   Weight as of 07/09/20: 133 lb 8 oz (60.6 kg).  LABS: CBC:    Component Value [name]/Time   WBC 4.3 07/13/2020 1047   HGB 10.1 (L) 07/13/2020 1047   HCT 31.8 (L) 07/13/2020 1047   PLT 381 07/13/2020 1047  MCV 80.9 07/13/2020 1047   NEUTROABS 2.9 07/13/2020 1047   LYMPHSABS 1.0 07/13/2020 1047   MONOABS 0.2 07/13/2020 1047   EOSABS 0.1 07/13/2020 1047   BASOSABS 0.0 07/13/2020 1047   Comprehensive Metabolic Panel:    Component Value [name]/Time   NA 137 07/13/2020 1047   K 3.7 07/13/2020 1047   CL 106 07/13/2020 1047   CO2 24 07/13/2020 1047   BUN 10 07/13/2020 1047   CREATININE 0.38 (L) 07/13/2020 1047   GLUCOSE 102 (H) 07/13/2020 1047   CALCIUM 8.8 (L) 07/13/2020 1047   AST 19 07/13/2020 1047   ALT 15 07/13/2020 1047   ALKPHOS 51 07/13/2020 1047   BILITOT 0.6 07/13/2020 1047   PROT 6.7 07/13/2020 1047   ALBUMIN 3.5 07/13/2020 1047    RADIOGRAPHIC STUDIES: NM Bone Scan Whole Body  Result [name]: 07/01/2020 CLINICAL DATA:  Metastatic breast cancer with bone metastases, history and  deficiency anemia, RIGHT hip fracture with surgery June 2021, kyphoplasties of T6, L1 and L4 in June 2021, fell 4 weeks ago striking RIGHT hip EXAM: NUCLEAR MEDICINE WHOLE BODY BONE SCAN TECHNIQUE: Whole body anterior and posterior images were obtained approximately 3 hours after intravenous injection of radiopharmaceutical. RADIOPHARMACEUTICALS:  21.785 mCi Technetium-36mMDP IV COMPARISON:  04/01/2020 Correlation: CT abdomen and pelvis 06/03/2020, RIGHT hip radiographs 06/07/2020, LEFT hip radiographs 06/22/2020 FINDINGS: Abnormal sites of tracer uptake in the LEFT skull base, posterior RIGHT second rib, anterior LEFT third rib, and BILATERAL pelvis consistent with osseous metastatic disease. Uptake at the posterior RIGHT second rib appears progressive since the previous exam. Uptake in the upper lumbar spine at L1 corresponding to kyphoplasty. Scattered uptake at costovertebral junction of thoracic spine and at RIGHT L5-S1 likely degenerative. Uptake within RIGHT femur corresponding to healing fracture and postsurgical changes. Uptake in the posterior RIGHT second rib appears slightly increased. No new sites of osseous metastasis identified. Expected urinary tract and soft tissue distribution of tracer. IMPRESSION: Stable distribution of osseous metastases though the uptake at the posterior RIGHT second rib has slightly increased. No new sites of osseous metastasis identified. Uptake in RIGHT femur consistent with healing fracture and postsurgical changes. Electronically Signed   By: MLavonia DanaM.D.   On: 07/01/2020 16:29   ECHOCARDIOGRAM COMPLETE  Result [name]: 06/24/2020    ECHOCARDIOGRAM REPORT   [facility] Name[facility]   [facility]: [facility] Medical Rec #:  [id number]            Height:       60.0 in Accession #:    [id number]           Weight:       135.5 lb [name] of Birth:  902/03/1980            BSA:          1.582 m Patient Age:    89years              BP:           96/61 mmHg Patient  Gender: F                     HR:           90 bpm. Exam Location:  [facility] Procedure: 2D Echo, Strain Analysis, Color Doppler and Cardiac Doppler Indications:     Chemo Z09  History:         Patient has prior history of Echocardiogram examinations, most  recent 03/23/2020. Anxiety, breast cancer.  Sonographer:     Sherrie Sport RDCS (AE) Referring Phys:  [id number] Weston Anna RAO Diagnosing Phys: Ida Rogue MD  Sonographer Comments: Suboptimal apical window. Global longitudinal strain was attempted. IMPRESSIONS  1. Left ventricular ejection fraction, by estimation, is 60 to 65%. The left ventricle has normal function. The left ventricle has no regional wall motion abnormalities. Left ventricular diastolic parameters were normal.  2. Right ventricular systolic function is normal. The right ventricular size is normal. There is normal pulmonary artery systolic pressure. The estimated right ventricular systolic pressure is 09.4 mmHg. FINDINGS  Left Ventricle: Left ventricular ejection fraction, by estimation, is 60 to 65%. The left ventricle has normal function. The left ventricle has no regional wall motion abnormalities. Global longitudinal strain performed but not reported based on interpreter judgement due to suboptimal tracking. The left ventricular internal cavity size was normal in size. There is no left ventricular hypertrophy. Left ventricular diastolic parameters were normal. Right Ventricle: The right ventricular size is normal. No increase in right ventricular wall thickness. Right ventricular systolic function is normal. There is normal pulmonary artery systolic pressure. The tricuspid regurgitant velocity is 2.55 m/s, and  with an assumed right atrial pressure of 3 mmHg, the estimated right ventricular systolic pressure is 07.6 mmHg. Left Atrium: Left atrial size was normal in size. Right Atrium: Right atrial size was normal in size. Pericardium: There is no evidence of pericardial effusion.  Mitral Valve: The mitral valve is normal in structure. Trivial mitral valve regurgitation. No evidence of mitral valve stenosis. Tricuspid Valve: The tricuspid valve is normal in structure. Tricuspid valve regurgitation is mild . No evidence of tricuspid stenosis. Aortic Valve: The aortic valve was not well visualized. Aortic valve regurgitation is not visualized. No aortic stenosis is present. Aortic valve mean gradient measures 4.5 mmHg. Aortic valve peak gradient measures 7.0 mmHg. Aortic valve area, by VTI measures 2.10 cm. Pulmonic Valve: The pulmonic valve was normal in structure. Pulmonic valve regurgitation is not visualized. No evidence of pulmonic stenosis. Aorta: The aortic root is normal in size and structure. Venous: The inferior vena cava is normal in size with greater than 50% respiratory variability, suggesting right atrial pressure of 3 mmHg. IAS/Shunts: No atrial level shunt detected by color flow Doppler.  LEFT VENTRICLE PLAX 2D LVIDd:         3.80 cm  Diastology LVIDs:         2.27 cm  LV e' medial:    8.38 cm/s LV PW:         0.72 cm  LV E/e' medial:  9.0 LV IVS:        0.93 cm  LV e' lateral:   14.30 cm/s LVOT diam:     2.00 cm  LV E/e' lateral: 5.3 LV SV:         52 LV SV Index:   33 LVOT Area:     3.14 cm  RIGHT VENTRICLE RV Basal diam:  3.34 cm RV S prime:     14.10 cm/s TAPSE (M-mode): 3.5 cm LEFT ATRIUM             Index       RIGHT ATRIUM           Index LA diam:        3.20 cm 2.02 cm/m  RA Area:     17.45 cm LA Vol (A2C):   63.2 ml 39.95 ml/m RA Volume:   50.10 ml  31.67 ml/m LA Vol (A4C):   38.5 ml 24.34 ml/m LA Biplane Vol: 49.3 ml 31.16 ml/m  AORTIC VALVE                    PULMONIC VALVE AV Area (Vmax):    1.85 cm     PV Vmax:        0.86 m/s AV Area (Vmean):   1.77 cm     PV Peak grad:   3.0 mmHg AV Area (VTI):     2.10 cm     RVOT Peak grad: 2 mmHg AV Vmax:           132.00 cm/s AV Vmean:          100.250 cm/s AV VTI:            0.250 m AV Peak Grad:      7.0 mmHg AV  Mean Grad:      4.5 mmHg LVOT Vmax:         77.70 cm/s LVOT Vmean:        56.500 cm/s LVOT VTI:          0.167 m LVOT/AV VTI ratio: 0.67  AORTA Ao Root diam: 2.60 cm MITRAL VALVE               TRICUSPID VALVE MV Area (PHT): 4.06 cm    TR Peak grad:   26.0 mmHg MV Decel Time: 187 msec    TR Vmax:        255.00 cm/s MV E velocity: 75.80 cm/s MV A velocity: 69.00 cm/s  SHUNTS MV E/A ratio:  1.10        Systemic VTI:  0.17 m                            Systemic Diam: 2.00 cm Ida Rogue MD Electronically signed by Ida Rogue MD Signature [name]/Time: 06/24/2020/5:45:42 PM    Final    DG HIP UNILAT WITH PELVIS 2-3 VIEWS LEFT  Result [name]: 06/22/2020 CLINICAL DATA:  Fall.  Hip pain. EXAM: DG HIP (WITH OR WITHOUT PELVIS) 2-3V LEFT COMPARISON:  06/07/2020.  CT 06/07/2020. FINDINGS: Postsurgical changes right hip. Postsurgical changes noted about the lower lumbar spine, right sacrum, right ilium. Stable sclerotic focus left iliac bone just above the acetabulum. Again metastatic disease could present in this fashion. Left hip is intact. No evidence of left hip fracture or dislocation. IMPRESSION: 1. Postsurgical changes right hip. Postsurgical changes about the lower lumbar spine, right sacrum, right ilium. 2. Stable sclerotic focus left iliac bone just above the acetabulum. Again metastatic disease could present this fashion. 3.  Left hip is unremarkable.  No acute abnormality noted. Electronically Signed   By: Marcello Moores  Register   On: 06/22/2020 12:35    PERFORMANCE STATUS (ECOG) : 1 - Symptomatic but completely ambulatory  Review of Systems Unless otherwise noted, a complete review of systems is negative.  Physical Exam General: NAD Cardiac: RRR Pulmonary: Clear to auscultation anterior/posterior fields Abdomen: Soft, nontender to palp, bowel sounds present Extremities: no edema, no joint deformities Skin: no rashes Neurological: Weakness but otherwise nonfocal  IMPRESSION: Patient with an add-on to  my clinic schedule today for evaluation of nausea and vomiting.  Patient received chemo 4 days ago and started having nausea 2 days ago.  She has vomited twice.  Oral intake has been poor.  She is having some dizziness with standing.  No fever or  chills.  No diarrhea or constipation.  No urinary symptoms.  Patient does endorse occasional headache but denies other distressing symptoms.  Nausea is most likely secondary to chemotherapy.  Labs are unremarkable.  She no longer has antiemetics to take at home.  We will send refills for ondansetron and prochlorperazine.  We will plan to give her 1 L normal saline and Zofran 4 mg IV today in the clinic.  Patient also asked about her trazodone.  She says that initially was effective at 50 mg nightly but she is recently had more difficulty sleeping.  Okay to increase to 100 mg nightly.  Anxiety and depression are reportedly improved.  She was smiling today and her affect seemed brighter.  PLAN: -Continue current scope of treatment -Continue Prozac 10 mg daily -Increase trazodone to 50-100 milligrams nightly -Refill ondansetron and prochlorperazine as needed for nausea -1 L normal saline and ondansetron 4 mg IV x1 -Follow-up MyChart visit in 1-2 months  Case and plan discussed with Dr. Janese Banks  Patient expressed understanding and was in agreement with this plan. She also understands that She can call the clinic at any time with any questions, concerns, or complaints.     Time Total: 15 minutes  Visit consisted of counseling and education dealing with the complex and emotionally intense issues of symptom management and palliative care in the setting of serious and potentially life-threatening illness.Greater than 50%  of this time was spent counseling and coordinating care related to the above assessment and plan.  Signed by: Altha Harm, PhD, NP-C

## 2020-07-19 ENCOUNTER — Other Ambulatory Visit: Payer: Self-pay | Admitting: *Deleted

## 2020-07-19 MED ORDER — OXYCODONE HCL ER 10 MG PO T12A: 10.0000 mg | EXTENDED_RELEASE_TABLET | Freq: Two times a day (BID) | ORAL | 0 refills | Status: DC

## 2020-07-20 ENCOUNTER — Other Ambulatory Visit: Payer: Self-pay | Admitting: Oncology

## 2020-07-23 ENCOUNTER — Other Ambulatory Visit: Payer: Self-pay

## 2020-07-23 ENCOUNTER — Inpatient Hospital Stay: Payer: Self-pay

## 2020-07-23 ENCOUNTER — Inpatient Hospital Stay (HOSPITAL_BASED_OUTPATIENT_CLINIC_OR_DEPARTMENT_OTHER): Payer: Self-pay | Admitting: Oncology

## 2020-07-23 ENCOUNTER — Inpatient Hospital Stay: Payer: Self-pay | Attending: Oncology

## 2020-07-23 VITALS — BP 117/75 | HR 79 | Temp 98.1°F | Resp 16 | Wt 137.2 lb

## 2020-07-23 DIAGNOSIS — Z171 Estrogen receptor negative status [ER-]: Secondary | ICD-10-CM | POA: Insufficient documentation

## 2020-07-23 DIAGNOSIS — R5383 Other fatigue: Secondary | ICD-10-CM | POA: Insufficient documentation

## 2020-07-23 DIAGNOSIS — C50919 Malignant neoplasm of unspecified site of unspecified female breast: Secondary | ICD-10-CM

## 2020-07-23 DIAGNOSIS — C7951 Secondary malignant neoplasm of bone: Secondary | ICD-10-CM | POA: Insufficient documentation

## 2020-07-23 DIAGNOSIS — U071 COVID-19: Secondary | ICD-10-CM | POA: Insufficient documentation

## 2020-07-23 DIAGNOSIS — Z5112 Encounter for antineoplastic immunotherapy: Secondary | ICD-10-CM | POA: Insufficient documentation

## 2020-07-23 DIAGNOSIS — M8440XA Pathological fracture, unspecified site, initial encounter for fracture: Secondary | ICD-10-CM

## 2020-07-23 DIAGNOSIS — Z5111 Encounter for antineoplastic chemotherapy: Secondary | ICD-10-CM | POA: Insufficient documentation

## 2020-07-23 DIAGNOSIS — C50912 Malignant neoplasm of unspecified site of left female breast: Secondary | ICD-10-CM | POA: Insufficient documentation

## 2020-07-23 DIAGNOSIS — G893 Neoplasm related pain (acute) (chronic): Secondary | ICD-10-CM | POA: Insufficient documentation

## 2020-07-23 DIAGNOSIS — Z79899 Other long term (current) drug therapy: Secondary | ICD-10-CM

## 2020-07-23 DIAGNOSIS — R0981 Nasal congestion: Secondary | ICD-10-CM | POA: Insufficient documentation

## 2020-07-23 DIAGNOSIS — Z5181 Encounter for therapeutic drug level monitoring: Secondary | ICD-10-CM

## 2020-07-23 DIAGNOSIS — G8929 Other chronic pain: Secondary | ICD-10-CM | POA: Insufficient documentation

## 2020-07-23 DIAGNOSIS — R5381 Other malaise: Secondary | ICD-10-CM | POA: Insufficient documentation

## 2020-07-23 DIAGNOSIS — M545 Low back pain, unspecified: Secondary | ICD-10-CM | POA: Insufficient documentation

## 2020-07-23 LAB — CBC WITH DIFFERENTIAL/PLATELET
Abs Immature Granulocytes: 0.01 10*3/uL (ref 0.00–0.07)
Basophils Absolute: 0 10*3/uL (ref 0.0–0.1)
Basophils Relative: 1 %
Eosinophils Absolute: 0.1 10*3/uL (ref 0.0–0.5)
Eosinophils Relative: 3 %
HCT: 34.1 % — ABNORMAL LOW (ref 36.0–46.0)
Hemoglobin: 10.7 g/dL — ABNORMAL LOW (ref 12.0–15.0)
Immature Granulocytes: 0 %
Lymphocytes Relative: 25 %
Lymphs Abs: 1.2 10*3/uL (ref 0.7–4.0)
MCH: 25.3 pg — ABNORMAL LOW (ref 26.0–34.0)
MCHC: 31.4 g/dL (ref 30.0–36.0)
MCV: 80.6 fL (ref 80.0–100.0)
Monocytes Absolute: 0.3 10*3/uL (ref 0.1–1.0)
Monocytes Relative: 6 %
Neutro Abs: 3.1 10*3/uL (ref 1.7–7.7)
Neutrophils Relative %: 65 %
Platelets: 403 10*3/uL — ABNORMAL HIGH (ref 150–400)
RBC: 4.23 MIL/uL (ref 3.87–5.11)
RDW: 17.5 % — ABNORMAL HIGH (ref 11.5–15.5)
WBC: 4.8 10*3/uL (ref 4.0–10.5)
nRBC: 0 % (ref 0.0–0.2)

## 2020-07-23 LAB — COMPREHENSIVE METABOLIC PANEL
ALT: 27 U/L (ref 0–44)
AST: 36 U/L (ref 15–41)
Albumin: 3.6 g/dL (ref 3.5–5.0)
Alkaline Phosphatase: 65 U/L (ref 38–126)
Anion gap: 9 (ref 5–15)
BUN: 9 mg/dL (ref 6–20)
CO2: 23 mmol/L (ref 22–32)
Calcium: 9 mg/dL (ref 8.9–10.3)
Chloride: 106 mmol/L (ref 98–111)
Creatinine, Ser: 0.34 mg/dL — ABNORMAL LOW (ref 0.44–1.00)
GFR, Estimated: >60 mL/min (ref >60)
Glucose, Bld: 134 mg/dL — ABNORMAL HIGH (ref 70–99)
Potassium: 3.5 mmol/L (ref 3.5–5.1)
Sodium: 138 mmol/L (ref 135–145)
Total Bilirubin: 0.6 mg/dL (ref 0.3–1.2)
Total Protein: 7.1 g/dL (ref 6.5–8.1)

## 2020-07-23 MED ORDER — HEPARIN SOD (PORK) LOCK FLUSH 100 UNIT/ML IV SOLN
INTRAVENOUS | Status: AC
  Filled 2020-07-23: qty 5

## 2020-07-23 MED ORDER — DIPHENHYDRAMINE HCL 50 MG/ML IJ SOLN
50.0000 mg | Freq: Once | INTRAMUSCULAR | Status: AC
  Administered 2020-07-23: 50 mg via INTRAVENOUS
  Filled 2020-07-23: qty 1

## 2020-07-23 MED ORDER — SODIUM CHLORIDE 0.9 % IV SOLN
65.0000 mg/m2 | Freq: Once | INTRAVENOUS | Status: AC
  Administered 2020-07-23: 108 mg via INTRAVENOUS
  Filled 2020-07-23: qty 18

## 2020-07-23 MED ORDER — FAMOTIDINE 20 MG IN NS 100 ML IVPB
20.0000 mg | Freq: Once | INTRAVENOUS | Status: AC
  Administered 2020-07-23: 20 mg via INTRAVENOUS
  Filled 2020-07-23: qty 20

## 2020-07-23 MED ORDER — TRASTUZUMAB-ANNS CHEMO 150 MG IV SOLR
6.0000 mg/kg | Freq: Once | INTRAVENOUS | Status: AC
  Administered 2020-07-23: 357 mg via INTRAVENOUS
  Filled 2020-07-23: qty 17

## 2020-07-23 MED ORDER — SODIUM CHLORIDE 0.9 % IV SOLN
420.0000 mg | Freq: Once | INTRAVENOUS | Status: AC
  Administered 2020-07-23: 420 mg via INTRAVENOUS
  Filled 2020-07-23: qty 14

## 2020-07-23 MED ORDER — ACETAMINOPHEN 325 MG PO TABS
650.0000 mg | ORAL_TABLET | Freq: Once | ORAL | Status: AC
  Administered 2020-07-23: 650 mg via ORAL
  Filled 2020-07-23: qty 2

## 2020-07-23 MED ORDER — HEPARIN SOD (PORK) LOCK FLUSH 100 UNIT/ML IV SOLN
500.0000 [IU] | Freq: Once | INTRAVENOUS | Status: AC | PRN
  Administered 2020-07-23: 500 [IU]
  Filled 2020-07-23: qty 5

## 2020-07-23 MED ORDER — SODIUM CHLORIDE 0.9 % IV SOLN
20.0000 mg | Freq: Once | INTRAVENOUS | Status: AC
  Administered 2020-07-23: 20 mg via INTRAVENOUS
  Filled 2020-07-23: qty 20

## 2020-07-23 MED ORDER — SODIUM CHLORIDE 0.9 % IV SOLN
Freq: Once | INTRAVENOUS | Status: AC
  Filled 2020-07-23: qty 250

## 2020-07-23 NOTE — Progress Notes (Signed)
Pt states she needs a rx for gabapentin. Also, traZOdone is not very effective for her.

## 2020-07-23 NOTE — Patient Instructions (Signed)
[facility]   Discharge Instructions: Thank you for choosing [facility] to provide your oncology and hematology care.  If you have a lab appointment with the Cancer Center, please go directly to the Cancer Center and check in at the registration area.  Wear comfortable clothing and clothing appropriate for easy access to any Portacath or PICC line.   We strive to give you quality time with your provider. You may need to reschedule your appointment if you arrive late (15 or more minutes).  Arriving late affects you and other patients whose appointments are after yours.  Also, if you miss three or more appointments without notifying the office, you may be dismissed from the clinic at the provider's discretion.      For prescription refill requests, have your pharmacy contact our office and allow 72 hours for refills to be completed.    Today you received the following chemotherapy and/or immunotherapy agents: Kanjinti, Perjeta, Taxol.      To help prevent nausea and vomiting after your treatment, we encourage you to take your nausea medication as directed.  BELOW ARE SYMPTOMS THAT SHOULD BE REPORTED IMMEDIATELY: *FEVER GREATER THAN 100.4 F (38 C) OR HIGHER *CHILLS OR SWEATING *NAUSEA AND VOMITING THAT IS NOT CONTROLLED WITH YOUR NAUSEA MEDICATION *UNUSUAL SHORTNESS OF BREATH *UNUSUAL BRUISING OR BLEEDING *URINARY PROBLEMS (pain or burning when urinating, or frequent urination) *BOWEL PROBLEMS (unusual diarrhea, constipation, pain near the anus) TENDERNESS IN MOUTH AND THROAT WITH OR WITHOUT PRESENCE OF ULCERS (sore throat, sores in mouth, or a toothache) UNUSUAL RASH, SWELLING OR PAIN  UNUSUAL VAGINAL DISCHARGE OR ITCHING   Items with * indicate a potential emergency and should be followed up as soon as possible or go to the Emergency Department if any problems should occur.  Please show the CHEMOTHERAPY ALERT CARD or IMMUNOTHERAPY ALERT  CARD at check-in to the Emergency Department and triage nurse.  Should you have questions after your visit or need to cancel or reschedule your appointment, please contact [facility]  [phone] and follow the prompts.  Office hours are 8:00 a.m. to 4:30 p.m. Monday - Friday. Please note that voicemails left after 4:00 p.m. may not be returned until the following business day.  We are closed weekends and major holidays. You have access to a nurse at all times for urgent questions. Please call the main number to the clinic [phone] and follow the prompts.  For any non-urgent questions, you may also contact your provider using MyChart. We now offer e-Visits for anyone 18 and older to request care online for non-urgent symptoms. For details visit [email or web address].   Also download the MyChart app! Go to the app store, search "MyChart", open the app, select [facility], and log in with your MyChart username and password.  Due to Covid, a mask is required upon entering the hospital/clinic. If you do not have a mask, one will be given to you upon arrival. For doctor visits, patients may have 1 support person aged 18 or older with them. For treatment visits, patients cannot have anyone with them due to current Covid guidelines and our immunocompromised population.  

## 2020-07-25 NOTE — Progress Notes (Signed)
Hematology/Oncology Consult note Sophia [name]  Telephone:([phone] [phone]  Patient Care Team: [name], [place], NP as PCP - General Sophia Junker, RN as Registered Nurse Theodore Demark, RN as Registered Nurse Sindy Guadeloupe, MD as Consulting Physician (Hematology and Oncology)   Name of the patient: Sophia [name]  [id number]  September 25, 1980   Date of visit: 07/25/20  Diagnosis- stage IV metastatic breast cancer ER/PR negative HER-2/neu positive with bone metastases  Chief complaint/ Reason for visit-on treatment assessment prior to cycle 16-day 1 of Taxol Herceptin and Perjeta  Heme/Onc history: patient is a 40 year old Hispanic female who is here with her friend. History obtained with the help of an interpreter.Patient self palpated left breast mass which was followed by a diagnostic bilateral mammogram. Mammogram showed 3.1 x 2.9 x 1.9 cm hypoechoic mass at the 1 o'clock position of the left breast. For abnormal cortically thickened left axillary lymph nodes measuring up to 5 mm. 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 Patient was also having ongoing back pain and was seen by [name]oped[facility] Dr. Doyle Askew who ordered MRI lumbar spine without contrast which showed possible pathologic fractures of L1 and L4 vertebral bodies with greater than 50% height loss at L1 and abnormal signal involving L2-L3 S1 as well as right iliac bone concerning for metastatic disease.Patient is a single mother of 3 adult children and is very anxious today.She reports significant back pain which radiates to her bilateral thighs. Denies any focal tingling numbness or weakness. Denies any bowel bladder incontinence. Pain has been uncontrolled despite taking Tylenol. No prior history of abnormal breast biopsies. No family history of  breast cancer  PET and MRIshowed 3 areas of pathologic fracture of her spine as well as widespread bony metastatic disease and concern for impending fracture of the right hip. Given her worsening pain she was asked to come to the ER. She has been evaluated by Dr. Rudene Christians from orthopedic surgery and underwent kyphoplasty at 3 different levels.T6 L1 and L4 along with radiofrequency ablation.She also underwent prophylactic fixation of the right hip and not affected the sacral region.   Patient received first dose of Herceptin and Perjeta on 09/04/2019. Baseline echocardiogram normal.she is currently getting taxol/ herceptin/perjeta  Patient admitted to hospital for acute abdominal pain with CT findings concerning for acute colitis. Colonoscopy showed diffuse severe inflammation with erythema friability and loss of vascularity and shallow ulcerations in the IC valve, ascending colon and cecum.   Interval history-history obtained with the help of Spanish interpreter.  Patient reports chronic low back pain as well as right-sided hip pain.  Overall pain has been stable.  Denies any diarrhea or abdominal pain.  Denies any tingling numbness in the hands and feet.  ECOG PS- 1 Pain scale- 0 Opioid associated constipation- no  Review of systems- Review of Systems  Constitutional: Positive for malaise/fatigue. Negative for chills, fever and weight loss.  HENT: Negative for congestion, ear discharge and nosebleeds.   Eyes: Negative for blurred vision.  Respiratory: Negative for cough, hemoptysis, sputum production, shortness of breath and wheezing.   Cardiovascular: Negative for chest pain, palpitations, orthopnea and claudication.  Gastrointestinal: Negative for abdominal pain, blood in stool, constipation, diarrhea, heartburn, melena, nausea and vomiting.  Genitourinary: Negative for dysuria, flank pain, frequency, hematuria and urgency.  Musculoskeletal: Positive for back pain and  joint pain.  Negative for myalgias.  Skin: Negative for rash.  Neurological: Negative for dizziness, tingling, focal weakness, seizures, weakness and headaches.  Endo/Heme/Allergies: Does not bruise/bleed easily.  Psychiatric/Behavioral: Negative for depression and suicidal ideas. The patient does not have insomnia.       No Known Allergies   Past Medical History:  Diagnosis Date  . Anxiety   . Breast cancer ([place])    with mets  . Cancer ([place])   . Colitis   . Family history of colon cancer   . Vertigo      Past Surgical History:  Procedure Laterality Date  . BREAST BIOPSY Left 08/14/2019   [place] bx of mass, coil marker, path pending  . BREAST BIOPSY Left 08/14/2019   [place] bx of LN, hydromarker, path pending  . BREAST BIOPSY Left 08/14/2019   affirm bx of calcs, x marker, path pending  . ESOPHAGOGASTRODUODENOSCOPY (EGD) WITH PROPOFOL N/A 10/05/2019   Procedure: ESOPHAGOGASTRODUODENOSCOPY (EGD) WITH PROPOFOL;  Surgeon: Lin Landsman, MD;  Location: [place];  Service: Gastroenterology;  Laterality: N/A;  . FLEXIBLE SIGMOIDOSCOPY N/A 10/05/2019   Procedure: FLEXIBLE SIGMOIDOSCOPY;  Surgeon: Lin Landsman, MD;  [name];  Service: Gastroenterology;  Laterality: N/A;  . INTRAMEDULLARY (IM) NAIL INTERTROCHANTERIC Right 09/01/2019   Procedure: INTRAMEDULLARY (IM) NAIL INTERTROCHANTRIC AND RADIOFREQUENCY ABLATION;  Surgeon: Hessie Knows, MD;  Location: [facility];  Service: Orthopedics;  Laterality: Right;  . KYPHOPLASTY N/A 08/29/2019   Procedure: KYPHOPLASTY T6, L1,L4 ,  RADIOFREQUENCY ABLATION;  Surgeon: Hessie Knows, MD;  Location: [facility];  Service: Orthopedics;  Laterality: N/A;  . KYPHOPLASTY Right 09/01/2019   Procedure: Right Sacral Radiofrequency Ablation and Cement Augmentation, Right sacrum and iliac crest;  Surgeon: Hessie Knows, MD;  Location: [facility];  Service: Orthopedics;  Laterality: Right;  . PORTA CATH INSERTION N/A 08/28/2019   Procedure: PORTA CATH  INSERTION;  Surgeon: Algernon Huxley, MD;  Location: [place] [facility];  Service: Cardiovascular;  Laterality: N/A;    Social History   Socioeconomic History  . Marital status: Single    Spouse name: Not on file  . Number of children: Not on file  . Years of education: Not on file  . Highest education level: Not on file  Occupational History  . Not on file  Tobacco Use  . Smoking status: Never Smoker  . Smokeless tobacco: Never Used  Vaping Use  . Vaping Use: Never used  Substance and Sexual Activity  . Alcohol use: Not Currently  . Drug use: Not Currently  . Sexual activity: Not Currently    Birth control/protection: None  Other Topics Concern  . Not on file  Social History Narrative   Lives at home with children   Social Determinants of Health   Financial Resource Strain: Not on file  Food Insecurity: Not on file  Transportation Needs: Not on file  Physical Activity: Not on file  Stress: Not on file  Social Connections: Not on file  Intimate Partner Violence: Not on file    Family History  Problem Relation Age of Onset  . Colon cancer Maternal Uncle      Current Outpatient Medications:  .  clindamycin (CLINDAGEL) 1 % gel, Apply topically 2 (two) times daily. X 7 days., Disp: 30 g, Rfl: 0 .  COVID-19 mRNA vaccine, Pfizer, 30 MCG/0.3ML injection, See admin instructions., Disp: , Rfl:  .  ergocalciferol (VITAMIN D2) 1.25 MG (50000 UT) capsule, Take 1 capsule (50,000 Units total) by mouth once  a week. For 4 weeks. Then 1 capsule once monthly for 4 months, Disp: 8 capsule, Rfl: 0 .  FLUoxetine (PROZAC) 10 MG capsule, Take 1 capsule (10 mg total) by mouth daily., Disp: 30 capsule, Rfl: 3 .  gabapentin (NEURONTIN) 300 MG capsule, Take 1 capsule (300 mg total) by mouth 3 (three) times daily., Disp: 180 capsule, Rfl: 3 .  lidocaine-prilocaine (EMLA) cream, Apply 1 application topically as needed. Apply small amount to port site at least 1 hour prior to it being accessed,  cover with plastic wrap, Disp: 30 g, Rfl: 1 .  traZODone (DESYREL) 50 MG tablet, Take 1-2 tablets (50-100 mg total) by mouth at bedtime as needed for sleep., Disp: 60 tablet, Rfl: 2 .  diphenoxylate-atropine (LOMOTIL) 2.5-0.025 MG tablet, Take 1 tablet by mouth 4 (four) times daily as needed for diarrhea or loose stools. (Patient not taking: No sig reported), Disp: 40 tablet, Rfl: 0 .  gabapentin (NEURONTIN) 300 MG capsule, Take by mouth. (Patient not taking: No sig reported), Disp: , Rfl:  .  ondansetron (ZOFRAN) 8 MG tablet, Take 1 tablet (8 mg total) by mouth every 8 (eight) hours as needed for nausea or vomiting. (Patient not taking: Reported on 07/23/2020), Disp: 45 tablet, Rfl: 2 .  oxycodone (OXY-IR) 5 MG capsule, Take 1-2 capsules (5-10 mg total) by mouth every 4 (four) hours as needed. (Patient not taking: Reported on 07/23/2020), Disp: 90 capsule, Rfl: 0 .  oxyCODONE (OXYCONTIN) 10 mg 12 hr tablet, Take 1 tablet (10 mg total) by mouth every 12 (twelve) hours. (Patient not taking: Reported on 07/23/2020), Disp: 60 tablet, Rfl: 0 .  potassium chloride SA (KLOR-CON) 20 MEQ tablet, Take by mouth. (Patient not taking: No sig reported), Disp: , Rfl:  .  prochlorperazine (COMPAZINE) 10 MG tablet, Take 1 tablet (10 mg total) by mouth every 6 (six) hours as needed for nausea or vomiting. (Patient not taking: Reported on 07/23/2020), Disp: 45 tablet, Rfl: 2 No current facility-administered medications for this visit.  Facility-Administered Medications Ordered in Other Visits:  .  0.9 %  sodium chloride infusion, , Intravenous, Continuous, Burns, Wandra Feinstein, NP, Stopped at 06/02/20 1453  Physical exam:  Vitals:   07/23/20 1028  BP: 117/75  Pulse: 79  Resp: 16  Temp: 98.1 F (36.7 C)  SpO2: 100%  Weight: 137 lb 3.2 oz (62.2 kg)   Physical Exam Constitutional:      General: She is not in acute distress. Cardiovascular:     Rate and Rhythm: Normal rate and regular rhythm.     Heart sounds: Normal  heart sounds.  Pulmonary:     Effort: Pulmonary effort is normal.     Breath sounds: Normal breath sounds.  Skin:    General: Skin is warm and dry.  Neurological:     Mental Status: She is alert and oriented to person, place, and time.      CMP Latest Ref Rng & Units 07/23/2020  Glucose 70 - 99 mg/dL 134(H)  BUN 6 - 20 mg/dL 9  Creatinine 0.44 - 1.00 mg/dL 0.34(L)  Sodium 135 - 145 mmol/L 138  Potassium 3.5 - 5.1 mmol/L 3.5  Chloride 98 - 111 mmol/L 106  CO2 22 - 32 mmol/L 23  Calcium 8.9 - 10.3 mg/dL 9.0  Total Protein 6.5 - 8.1 g/dL 7.1  Total Bilirubin 0.3 - 1.2 mg/dL 0.6  Alkaline Phos 38 - 126 U/L 65  AST 15 - 41 U/L 36  ALT 0 - 44 U/L  27   CBC Latest Ref Rng & Units 07/23/2020  WBC 4.0 - 10.5 K/uL 4.8  Hemoglobin 12.0 - 15.0 g/dL 10.7(L)  Hematocrit 36.0 - 46.0 % 34.1(L)  Platelets 150 - 400 K/uL 403(H)    No images are attached to the encounter.  NM Bone Scan Whole Body  Result Date: 07/01/2020 CLINICAL DATA:  Metastatic breast cancer with bone metastases, history and deficiency anemia, RIGHT hip fracture with surgery June 2021, kyphoplasties of T6, L1 and L4 in June 2021, fell 4 weeks ago striking RIGHT hip EXAM: NUCLEAR MEDICINE WHOLE BODY BONE SCAN TECHNIQUE: Whole body anterior and posterior images were obtained approximately 3 hours after intravenous injection of radiopharmaceutical. RADIOPHARMACEUTICALS:  21.785 mCi Technetium-62m MDP IV COMPARISON:  04/01/2020 Correlation: CT abdomen and pelvis 06/03/2020, RIGHT hip radiographs 06/07/2020, LEFT hip radiographs 06/22/2020 FINDINGS: Abnormal sites of tracer uptake in the LEFT skull base, posterior RIGHT second rib, anterior LEFT third rib, and BILATERAL pelvis consistent with osseous metastatic disease. Uptake at the posterior RIGHT second rib appears progressive since the previous exam. Uptake in the upper lumbar spine at L1 corresponding to kyphoplasty. Scattered uptake at costovertebral junction of thoracic spine and  at RIGHT L5-S1 likely degenerative. Uptake within RIGHT femur corresponding to healing fracture and postsurgical changes. Uptake in the posterior RIGHT second rib appears slightly increased. No new sites of osseous metastasis identified. Expected urinary tract and soft tissue distribution of tracer. IMPRESSION: Stable distribution of osseous metastases though the uptake at the posterior RIGHT second rib has slightly increased. No new sites of osseous metastasis identified. Uptake in RIGHT femur consistent with healing fracture and postsurgical changes. Electronically Signed   By: Lavonia Dana M.D.   On: 07/01/2020 16:29     Assessment and plan- Patient is a 40 y.o. female with metastatic ER/PR negative HER-2 positive breast cancer with bone metastases.  She is here for on treatment assessment prior to cycle 16-day 1 of Taxol Herceptin and Perjeta  Counts okay to proceed with cycle 16-day 1 of Taxol Herceptin and Perjeta today.  She will receive Taxol alone next week and I will see her back in 3 weeks for cycle 17-day 1 of THP chemotherapy.  Overall tolerating treatment well without any significant Peripheral neuropathy.  Neoplasm related pain: Continue as needed oxycodone  Insomnia: Continue as needed trazodone  Bone metastases: She will get Xgeva next week   Visit Diagnosis 1. Metastatic breast cancer ([place])   2. Encounter for antineoplastic chemotherapy   3. Encounter for monoclonal antibody treatment for malignancy   4. Neoplasm related pain   5. Bone metastases ([place])   6. Encounter for monitoring denosumab therapy      Dr. Randa Evens, MD, MPH [name] [phone] 07/25/2020 5:37 PM

## 2020-07-26 ENCOUNTER — Other Ambulatory Visit: Payer: Self-pay | Admitting: *Deleted

## 2020-07-26 MED ORDER — OXYCODONE HCL 5 MG PO CAPS: 5.0000 mg | ORAL_CAPSULE | ORAL | 0 refills | Status: DC | PRN

## 2020-07-26 MED ORDER — GABAPENTIN 600 MG PO TABS: 600.0000 mg | ORAL_TABLET | Freq: Three times a day (TID) | ORAL | 2 refills | Status: DC

## 2020-07-30 ENCOUNTER — Inpatient Hospital Stay: Payer: Self-pay

## 2020-07-30 ENCOUNTER — Ambulatory Visit: Payer: Self-pay | Attending: Internal Medicine

## 2020-07-30 VITALS — BP 114/78 | HR 80 | Temp 97.5°F | Resp 18 | Wt 131.5 lb

## 2020-07-30 DIAGNOSIS — C7951 Secondary malignant neoplasm of bone: Secondary | ICD-10-CM

## 2020-07-30 DIAGNOSIS — C50919 Malignant neoplasm of unspecified site of unspecified female breast: Secondary | ICD-10-CM

## 2020-07-30 DIAGNOSIS — M8440XA Pathological fracture, unspecified site, initial encounter for fracture: Secondary | ICD-10-CM

## 2020-07-30 DIAGNOSIS — Z23 Encounter for immunization: Secondary | ICD-10-CM

## 2020-07-30 LAB — CBC WITH DIFFERENTIAL/PLATELET
Abs Immature Granulocytes: 0.05 10*3/uL (ref 0.00–0.07)
Basophils Absolute: 0 10*3/uL (ref 0.0–0.1)
Basophils Relative: 1 %
Eosinophils Absolute: 0.1 10*3/uL (ref 0.0–0.5)
Eosinophils Relative: 2 %
HCT: 33 % — ABNORMAL LOW (ref 36.0–46.0)
Hemoglobin: 10.6 g/dL — ABNORMAL LOW (ref 12.0–15.0)
Immature Granulocytes: 1 %
Lymphocytes Relative: 20 %
Lymphs Abs: 1.3 10*3/uL (ref 0.7–4.0)
MCH: 25.9 pg — ABNORMAL LOW (ref 26.0–34.0)
MCHC: 32.1 g/dL (ref 30.0–36.0)
MCV: 80.5 fL (ref 80.0–100.0)
Monocytes Absolute: 0.4 10*3/uL (ref 0.1–1.0)
Monocytes Relative: 6 %
Neutro Abs: 4.7 10*3/uL (ref 1.7–7.7)
Neutrophils Relative %: 70 %
Platelets: 417 10*3/uL — ABNORMAL HIGH (ref 150–400)
RBC: 4.1 MIL/uL (ref 3.87–5.11)
RDW: 17.3 % — ABNORMAL HIGH (ref 11.5–15.5)
WBC: 6.6 10*3/uL (ref 4.0–10.5)
nRBC: 0 % (ref 0.0–0.2)

## 2020-07-30 LAB — COMPREHENSIVE METABOLIC PANEL
ALT: 13 U/L (ref 0–44)
AST: 18 U/L (ref 15–41)
Albumin: 3.8 g/dL (ref 3.5–5.0)
Alkaline Phosphatase: 55 U/L (ref 38–126)
Anion gap: 8 (ref 5–15)
BUN: 12 mg/dL (ref 6–20)
CO2: 23 mmol/L (ref 22–32)
Calcium: 9 mg/dL (ref 8.9–10.3)
Chloride: 107 mmol/L (ref 98–111)
Creatinine, Ser: 0.41 mg/dL — ABNORMAL LOW (ref 0.44–1.00)
GFR, Estimated: >60 mL/min (ref >60)
Glucose, Bld: 96 mg/dL (ref 70–99)
Potassium: 3.6 mmol/L (ref 3.5–5.1)
Sodium: 138 mmol/L (ref 135–145)
Total Bilirubin: 0.4 mg/dL (ref 0.3–1.2)
Total Protein: 7.1 g/dL (ref 6.5–8.1)

## 2020-07-30 MED ORDER — SODIUM CHLORIDE 0.9% FLUSH
10.0000 mL | Freq: Once | INTRAVENOUS | Status: AC
  Administered 2020-07-30: 10 mL via INTRAVENOUS
  Filled 2020-07-30: qty 10

## 2020-07-30 MED ORDER — HEPARIN SOD (PORK) LOCK FLUSH 100 UNIT/ML IV SOLN
INTRAVENOUS | Status: AC
  Filled 2020-07-30: qty 5

## 2020-07-30 MED ORDER — HEPARIN SOD (PORK) LOCK FLUSH 100 UNIT/ML IV SOLN
500.0000 [IU] | Freq: Once | INTRAVENOUS | Status: AC | PRN
  Administered 2020-07-30: 500 [IU]
  Filled 2020-07-30: qty 5

## 2020-07-30 MED ORDER — SODIUM CHLORIDE 0.9 % IV SOLN
20.0000 mg | Freq: Once | INTRAVENOUS | Status: AC
  Administered 2020-07-30: 20 mg via INTRAVENOUS
  Filled 2020-07-30: qty 20

## 2020-07-30 MED ORDER — SODIUM CHLORIDE 0.9 % IV SOLN
65.0000 mg/m2 | Freq: Once | INTRAVENOUS | Status: AC
  Administered 2020-07-30: 108 mg via INTRAVENOUS
  Filled 2020-07-30: qty 18

## 2020-07-30 MED ORDER — FAMOTIDINE 20 MG IN NS 100 ML IVPB
20.0000 mg | Freq: Once | INTRAVENOUS | Status: AC
  Administered 2020-07-30: 20 mg via INTRAVENOUS
  Filled 2020-07-30: qty 20

## 2020-07-30 MED ORDER — DIPHENHYDRAMINE HCL 50 MG/ML IJ SOLN
50.0000 mg | Freq: Once | INTRAMUSCULAR | Status: AC
  Administered 2020-07-30: 50 mg via INTRAVENOUS
  Filled 2020-07-30: qty 1

## 2020-07-30 MED ORDER — SODIUM CHLORIDE 0.9 % IV SOLN
Freq: Once | INTRAVENOUS | Status: AC
Start: 2020-07-30 — End: 2020-07-30
  Filled 2020-07-30: qty 250

## 2020-07-30 MED ORDER — DENOSUMAB 120 MG/1.7ML ~~LOC~~ SOLN
120.0000 mg | Freq: Once | SUBCUTANEOUS | Status: AC
  Administered 2020-07-30: 120 mg via SUBCUTANEOUS
  Filled 2020-07-30: qty 1.7

## 2020-07-30 NOTE — Progress Notes (Signed)
   Covid-19 Vaccination Clinic  Name:  Sophia [name]    MRN: [id number] DOB: 03/05/1981  07/30/2020  [name] was observed post Covid-19 immunization for 15 minutes without incident. She was provided with Vaccine Information Sheet and instruction to access the V-Safe system.   [name] was instructed to call 911 with any severe reactions post vaccine: Marland Kitchen Difficulty breathing  . Swelling of face and throat  . A fast heartbeat  . A bad rash all over body  . Dizziness and weakness   Immunizations Administered    Name Date Dose VIS Date Route   PFIZER Comrnaty(Gray TOP) Covid-19 Vaccine 07/30/2020  2:01 PM 0.3 mL 02/26/2020 Intramuscular   Manufacturer: [employer]   Lot: [id number]   NDC: [place], PharmD, MBA Clinical Acute Care Pharmacist

## 2020-07-30 NOTE — Patient Instructions (Signed)
[facility]  Discharge Instructions: Thank you for choosing [facility] to provide your oncology and hematology care.  If you have a lab appointment with the Cancer Center, please go directly to the Cancer Center and check in at the registration area.  Wear comfortable clothing and clothing appropriate for easy access to any Portacath or PICC line.   We strive to give you quality time with your provider. You may need to reschedule your appointment if you arrive late (15 or more minutes).  Arriving late affects you and other patients whose appointments are after yours.  Also, if you miss three or more appointments without notifying the office, you may be dismissed from the clinic at the provider's discretion.      For prescription refill requests, have your pharmacy contact our office and allow 72 hours for refills to be completed.    Today you received the following chemotherapy and/or immunotherapy agents TaxolPaclitaxel injection What is this medicine? PACLITAXEL (PAK li TAX el) is a chemotherapy drug. It targets fast dividing cells, like cancer cells, and causes these cells to die. This medicine is used to treat ovarian cancer, breast cancer, lung cancer, Kaposi's sarcoma, and other cancers. This medicine may be used for other purposes; ask your health care provider or pharmacist if you have questions. COMMON BRAND NAME(S): Onxol, Taxol What should I tell my health care provider before I take this medicine? They need to know if you have any of these conditions:  history of irregular heartbeat  liver disease  low blood counts, like low white cell, platelet, or red cell counts  lung or breathing disease, like asthma  tingling of the fingers or toes, or other nerve disorder  an unusual or allergic reaction to paclitaxel, alcohol, polyoxyethylated castor oil, other chemotherapy, other medicines, foods, dyes, or preservatives  pregnant or  trying to get pregnant  breast-feeding How should I use this medicine? This drug is given as an infusion into a vein. It is administered in a hospital or clinic by a specially trained health care professional. Talk to your pediatrician regarding the use of this medicine in children. Special care may be needed. Overdosage: If you think you have taken too much of this medicine contact a poison control center or emergency room at once. NOTE: This medicine is only for you. Do not share this medicine with others. What if I miss a dose? It is important not to miss your dose. Call your doctor or health care professional if you are unable to keep an appointment. What may interact with this medicine? Do not take this medicine with any of the following medications:  live virus vaccines This medicine may also interact with the following medications:  antiviral medicines for hepatitis, HIV or AIDS  certain antibiotics like erythromycin and clarithromycin  certain medicines for fungal infections like ketoconazole and itraconazole  certain medicines for seizures like carbamazepine, phenobarbital, phenytoin  gemfibrozil  nefazodone  rifampin  St. John's wort This list may not describe all possible interactions. Give your health care provider a list of all the medicines, herbs, non-prescription drugs, or dietary supplements you use. Also tell them if you smoke, drink alcohol, or use illegal drugs. Some items may interact with your medicine. What should I watch for while using this medicine? Your condition will be monitored carefully while you are receiving this medicine. You will need important blood work done while you are taking this medicine. This medicine can cause serious   allergic reactions. To reduce your risk you will need to take other medicine(s) before treatment with this medicine. If you experience allergic reactions like skin rash, itching or hives, swelling of the face, lips, or  tongue, tell your doctor or health care professional right away. In some cases, you may be given additional medicines to help with side effects. Follow all directions for their use. This drug may make you feel generally unwell. This is not uncommon, as chemotherapy can affect healthy cells as well as cancer cells. Report any side effects. Continue your course of treatment even though you feel ill unless your doctor tells you to stop. Call your doctor or health care professional for advice if you get a fever, chills or sore throat, or other symptoms of a cold or flu. Do not treat yourself. This drug decreases your body's ability to fight infections. Try to avoid being around people who are sick. This medicine may increase your risk to bruise or bleed. Call your doctor or health care professional if you notice any unusual bleeding. Be careful brushing and flossing your teeth or using a toothpick because you may get an infection or bleed more easily. If you have any dental work done, tell your dentist you are receiving this medicine. Avoid taking products that contain aspirin, acetaminophen, ibuprofen, naproxen, or ketoprofen unless instructed by your doctor. These medicines may hide a fever. Do not become pregnant while taking this medicine. Women should inform their doctor if they wish to become pregnant or think they might be pregnant. There is a potential for serious side effects to an unborn child. Talk to your health care professional or pharmacist for more information. Do not breast-feed an infant while taking this medicine. Men are advised not to father a child while receiving this medicine. This product may contain alcohol. Ask your pharmacist or healthcare provider if this medicine contains alcohol. Be sure to tell all healthcare providers you are taking this medicine. Certain medicines, like metronidazole and disulfiram, can cause an unpleasant reaction when taken with alcohol. The reaction includes  flushing, headache, nausea, vomiting, sweating, and increased thirst. The reaction can last from 30 minutes to several hours. What side effects may I notice from receiving this medicine? Side effects that you should report to your doctor or health care professional as soon as possible:  allergic reactions like skin rash, itching or hives, swelling of the face, lips, or tongue  breathing problems  changes in vision  fast, irregular heartbeat  high or low blood pressure  mouth sores  pain, tingling, numbness in the hands or feet  signs of decreased platelets or bleeding - bruising, pinpoint red spots on the skin, black, tarry stools, blood in the urine  signs of decreased red blood cells - unusually weak or tired, feeling faint or lightheaded, falls  signs of infection - fever or chills, cough, sore throat, pain or difficulty passing urine  signs and symptoms of liver injury like dark yellow or brown urine; general ill feeling or flu-like symptoms; light-colored stools; loss of appetite; nausea; right upper belly pain; unusually weak or tired; yellowing of the eyes or skin  swelling of the ankles, feet, hands  unusually slow heartbeat Side effects that usually do not require medical attention (report to your doctor or health care professional if they continue or are bothersome):  diarrhea  hair loss  loss of appetite  muscle or joint pain  nausea, vomiting  pain, redness, or irritation at site where injected    tiredness This list may not describe all possible side effects. Call your doctor for medical advice about side effects. You may report side effects to FDA at 1-800-FDA-1088. Where should I keep my medicine? This drug is given in a hospital or clinic and will not be stored at home. NOTE: This sheet is a summary. It may not cover all possible information. If you have questions about this medicine, talk to your doctor, pharmacist, or health care provider.  2021  Elsevier/Gold Standard (2019-02-05 13:37:23)       To help prevent nausea and vomiting after your treatment, we encourage you to take your nausea medication as directed.  BELOW ARE SYMPTOMS THAT SHOULD BE REPORTED IMMEDIATELY: . *FEVER GREATER THAN 100.4 F (38 C) OR HIGHER . *CHILLS OR SWEATING . *NAUSEA AND VOMITING THAT IS NOT CONTROLLED WITH YOUR NAUSEA MEDICATION . *UNUSUAL SHORTNESS OF BREATH . *UNUSUAL BRUISING OR BLEEDING . *URINARY PROBLEMS (pain or burning when urinating, or frequent urination) . *BOWEL PROBLEMS (unusual diarrhea, constipation, pain near the anus) . TENDERNESS IN MOUTH AND THROAT WITH OR WITHOUT PRESENCE OF ULCERS (sore throat, sores in mouth, or a toothache) . UNUSUAL RASH, SWELLING OR PAIN  . UNUSUAL VAGINAL DISCHARGE OR ITCHING   Items with * indicate a potential emergency and should be followed up as soon as possible or go to the Emergency Department if any problems should occur.  Please show the CHEMOTHERAPY ALERT CARD or IMMUNOTHERAPY ALERT CARD at check-in to the Emergency Department and triage nurse.  Should you have questions after your visit or need to cancel or reschedule your appointment, please contact [facility]  [phone] and follow the prompts.  Office hours are 8:00 a.m. to 4:30 p.m. Monday - Friday. Please note that voicemails left after 4:00 p.m. may not be returned until the following business day.  We are closed weekends and major holidays. You have access to a nurse at all times for urgent questions. Please call the main number to the clinic [phone] and follow the prompts.  For any non-urgent questions, you may also contact your provider using MyChart. We now offer e-Visits for anyone 18 and older to request care online for non-urgent symptoms. For details visit [email or web address].   Also download the MyChart app! Go to the app store, search "MyChart", open the app, select [facility], and log  in with your MyChart username and password.  Due to Covid, a mask is required upon entering the hospital/clinic. If you do not have a mask, one will be given to you upon arrival. For doctor visits, patients may have 1 support person aged 18 or older with them. For treatment visits, patients cannot have anyone with them due to current Covid guidelines and our immunocompromised population.  

## 2020-07-31 LAB — CANCER ANTIGEN 27.29: CA 27.29: 15.5 U/mL (ref 0.0–38.6)

## 2020-08-03 ENCOUNTER — Other Ambulatory Visit: Payer: Self-pay | Admitting: *Deleted

## 2020-08-03 ENCOUNTER — Other Ambulatory Visit: Payer: Self-pay

## 2020-08-03 ENCOUNTER — Telehealth: Payer: Self-pay | Admitting: Oncology

## 2020-08-03 ENCOUNTER — Other Ambulatory Visit: Payer: Self-pay | Admitting: Oncology

## 2020-08-03 ENCOUNTER — Telehealth: Payer: Self-pay | Admitting: *Deleted

## 2020-08-03 MED ORDER — MOLNUPIRAVIR EUA 200MG CAPSULE
4.0000 | ORAL_CAPSULE | Freq: Two times a day (BID) | ORAL | 0 refills | Status: AC
  Filled 2020-08-03: qty 40, 5d supply, fill #0

## 2020-08-03 MED ORDER — PFIZER-BIONT COVID-19 VAC-TRIS 30 MCG/0.3ML IM SUSP
INTRAMUSCULAR | 0 refills | Status: DC
  Filled 2020-08-03: qty 0.3, 1d supply, fill #0

## 2020-08-03 MED ORDER — NIRMATRELVIR/RITONAVIR (PAXLOVID)TABLET: 3.0000 | ORAL_TABLET | Freq: Two times a day (BID) | ORAL | 0 refills | Status: DC

## 2020-08-03 NOTE — Telephone Encounter (Signed)
Fax from [facility] the they do not have the Paxlovid in stock, but they do have Molnupiravir in stock if you want that. Otherwise [facility] [address] [facility] it in [place]

## 2020-08-03 NOTE — Progress Notes (Signed)
Re: Covid  Rx changed to [facility].   Script sent to [facility].   Faythe Casa, NP 08/03/2020 3:29 PM

## 2020-08-03 NOTE — Telephone Encounter (Signed)
Re: Paxlovid prescription  Called Paxlovid Rx into pharmacy.   Patient has good kidney function so no need to dose reduce her Paxlovid.   Checked medication interactions-  1. Paxlovid can increase the effects of oxycodone-  Recommend she half her dose while on Paxlovid.   2.  Trazodone can have increased sedation while on Paxlovid  Recommend she decrease her dose of trazodone while taking Paxlovid  Will call to see if [facility] can deliver to her home.   Faythe Casa, NP 08/03/2020 12:13 PM

## 2020-08-03 NOTE — Telephone Encounter (Signed)
Re: Covid +  Called and left voicemail for patient regarding recent COVID-19 positive test result.  Patient would likely benefit from an oral antiviral such as Paxlovid or molnupiravir.   Will wait for phone call back.  Faythe Casa, NP 08/03/2020 10:22 AM

## 2020-08-05 ENCOUNTER — Inpatient Hospital Stay (HOSPITAL_BASED_OUTPATIENT_CLINIC_OR_DEPARTMENT_OTHER): Payer: Self-pay | Admitting: Hospice and Palliative Medicine

## 2020-08-05 DIAGNOSIS — C50919 Malignant neoplasm of unspecified site of unspecified female breast: Secondary | ICD-10-CM

## 2020-08-05 DIAGNOSIS — U071 COVID-19: Secondary | ICD-10-CM

## 2020-08-05 MED ORDER — HYDROCOD POLST-CPM POLST ER 10-8 MG/5ML PO SUER: 5.0000 mL | Freq: Two times a day (BID) | ORAL | 0 refills | Status: DC | PRN

## 2020-08-05 NOTE — Progress Notes (Signed)
Virtual Visit via Telephone Note  I connected with Sophia [name] on 08/05/20 at 11:45 AM EDT by telephone and verified that I am speaking with the correct person using two identifiers.  Location: Patient: Home Provider: Clinic   I discussed the limitations, risks, security and privacy concerns of performing an evaluation and management service by telephone and the availability of in person appointments. I also discussed with the patient that there may be a patient responsible charge related to this service. The patient expressed understanding and agreed to proceed.   History of Present Illness: Sophia [name] is a 40 y.o. 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    Patient and her children tested positive for COVID this week.  She was started on molnupiravir by Rulon Abide, NP on 08/03/2020.  Patient requested a virtual visit today as she complains of a persistent cough and pain from coughing that is keeping her awake at night.   Observations/Objective: With use of an interpreter, I spoke with patient by phone.  She is taking the molnupiravir but only 800 mg once daily.  I explained that the dose prescribed was 800 mg twice daily for 5 days.  She continues to endorse symptoms including cough, sore throat, and nasal congestion.  She denies shortness of breath, fever, or chills.  She says that the cough is the most bothersome symptom and is keeping her awake at night.  She is using OTC acetaminophen and Vicks vapor rub but has not found it to be particularly helpful.   Assessment and Plan: COVID -on treatment with molnupiravir.  Continue symptomatic care including increased fluids, and use of OTC nasal decongestants, Cepacol lozenges, antipyretics and analgesics.  We will start patient on an antitussive. Rx sent for Tussionex 5 mL twice daily as needed for cough #140 mL.  Discussed quarantining  procedures and instructions to seek emergency care should symptoms worsen or she becomes short of breath.  Follow Up Instructions: As needed   I discussed the assessment and treatment plan with the patient. The patient was provided an opportunity to ask questions and all were answered. The patient agreed with the plan and demonstrated an understanding of the instructions.   The patient was advised to call back or seek an in-person evaluation if the symptoms worsen or if the condition fails to improve as anticipated.  I provided 20 minutes of non-face-to-face time during this encounter.   Irean Hong, NP

## 2020-08-13 ENCOUNTER — Encounter: Payer: Self-pay | Admitting: Hospice and Palliative Medicine

## 2020-08-13 ENCOUNTER — Encounter: Payer: Self-pay | Admitting: Oncology

## 2020-08-13 ENCOUNTER — Inpatient Hospital Stay: Payer: Self-pay

## 2020-08-13 ENCOUNTER — Other Ambulatory Visit: Payer: Self-pay | Admitting: *Deleted

## 2020-08-13 ENCOUNTER — Inpatient Hospital Stay (HOSPITAL_BASED_OUTPATIENT_CLINIC_OR_DEPARTMENT_OTHER): Payer: Self-pay | Admitting: Oncology

## 2020-08-13 ENCOUNTER — Inpatient Hospital Stay (HOSPITAL_BASED_OUTPATIENT_CLINIC_OR_DEPARTMENT_OTHER): Payer: Self-pay | Admitting: Hospice and Palliative Medicine

## 2020-08-13 DIAGNOSIS — C50919 Malignant neoplasm of unspecified site of unspecified female breast: Secondary | ICD-10-CM

## 2020-08-13 DIAGNOSIS — Z8616 Personal history of COVID-19: Secondary | ICD-10-CM

## 2020-08-13 DIAGNOSIS — C50912 Malignant neoplasm of unspecified site of left female breast: Secondary | ICD-10-CM

## 2020-08-13 DIAGNOSIS — Z5111 Encounter for antineoplastic chemotherapy: Secondary | ICD-10-CM

## 2020-08-13 DIAGNOSIS — Z515 Encounter for palliative care: Secondary | ICD-10-CM

## 2020-08-13 DIAGNOSIS — Z5112 Encounter for antineoplastic immunotherapy: Secondary | ICD-10-CM

## 2020-08-13 LAB — CBC WITH DIFFERENTIAL/PLATELET
Abs Immature Granulocytes: 0.04 10*3/uL (ref 0.00–0.07)
Basophils Absolute: 0 10*3/uL (ref 0.0–0.1)
Basophils Relative: 0 %
Eosinophils Absolute: 0.2 10*3/uL (ref 0.0–0.5)
Eosinophils Relative: 2 %
HCT: 32.1 % — ABNORMAL LOW (ref 36.0–46.0)
Hemoglobin: 10.1 g/dL — ABNORMAL LOW (ref 12.0–15.0)
Immature Granulocytes: 1 %
Lymphocytes Relative: 18 %
Lymphs Abs: 1.5 10*3/uL (ref 0.7–4.0)
MCH: 25.4 pg — ABNORMAL LOW (ref 26.0–34.0)
MCHC: 31.5 g/dL (ref 30.0–36.0)
MCV: 80.7 fL (ref 80.0–100.0)
Monocytes Absolute: 0.7 10*3/uL (ref 0.1–1.0)
Monocytes Relative: 9 %
Neutro Abs: 5.8 10*3/uL (ref 1.7–7.7)
Neutrophils Relative %: 70 %
Platelets: 471 10*3/uL — ABNORMAL HIGH (ref 150–400)
RBC: 3.98 MIL/uL (ref 3.87–5.11)
RDW: 17.6 % — ABNORMAL HIGH (ref 11.5–15.5)
WBC: 8.3 10*3/uL (ref 4.0–10.5)
nRBC: 0 % (ref 0.0–0.2)

## 2020-08-13 LAB — COMPREHENSIVE METABOLIC PANEL
ALT: 15 U/L (ref 0–44)
AST: 21 U/L (ref 15–41)
Albumin: 3.6 g/dL (ref 3.5–5.0)
Alkaline Phosphatase: 52 U/L (ref 38–126)
Anion gap: 11 (ref 5–15)
BUN: 9 mg/dL (ref 6–20)
CO2: 23 mmol/L (ref 22–32)
Calcium: 8.4 mg/dL — ABNORMAL LOW (ref 8.9–10.3)
Chloride: 105 mmol/L (ref 98–111)
Creatinine, Ser: 0.45 mg/dL (ref 0.44–1.00)
GFR, Estimated: >60 mL/min (ref >60)
Glucose, Bld: 94 mg/dL (ref 70–99)
Potassium: 3.7 mmol/L (ref 3.5–5.1)
Sodium: 139 mmol/L (ref 135–145)
Total Bilirubin: 0.5 mg/dL (ref 0.3–1.2)
Total Protein: 6.5 g/dL (ref 6.5–8.1)

## 2020-08-13 MED ORDER — DIPHENOXYLATE-ATROPINE 2.5-0.025 MG PO TABS: 1.0000 | ORAL_TABLET | Freq: Four times a day (QID) | ORAL | 0 refills | Status: DC | PRN

## 2020-08-13 MED ORDER — HYDROCOD POLST-CPM POLST ER 10-8 MG/5ML PO SUER: 5.0000 mL | Freq: Two times a day (BID) | ORAL | 0 refills | Status: DC | PRN

## 2020-08-13 MED ORDER — OXYCODONE HCL ER 10 MG PO T12A: 10.0000 mg | EXTENDED_RELEASE_TABLET | Freq: Two times a day (BID) | ORAL | 0 refills | Status: DC

## 2020-08-13 MED ORDER — ALBUTEROL SULFATE HFA 108 (90 BASE) MCG/ACT IN AERS: 2.0000 | INHALATION_SPRAY | Freq: Four times a day (QID) | RESPIRATORY_TRACT | 2 refills | Status: DC | PRN

## 2020-08-13 MED ORDER — HEPARIN SOD (PORK) LOCK FLUSH 100 UNIT/ML IV SOLN
500.0000 [IU] | Freq: Once | INTRAVENOUS | Status: DC
  Filled 2020-08-13: qty 5

## 2020-08-13 MED ORDER — SODIUM CHLORIDE 0.9% FLUSH
10.0000 mL | Freq: Once | INTRAVENOUS | Status: AC
Start: 2020-08-13 — End: 2020-08-13
  Administered 2020-08-13: 10 mL via INTRAVENOUS
  Filled 2020-08-13: qty 10

## 2020-08-13 NOTE — Progress Notes (Signed)
Pt still having dry cough, taking cough med. Feels weak still. With cough she has some chest tightness.

## 2020-08-13 NOTE — Progress Notes (Signed)
[facility]  Telephone:([phone] [phone]   Name: Sophia [name] Date: 08/13/2020 MRN: [id number]  DOB: Feb 28, 1981  Patient Care Team: [facility], [place], NP as PCP - General Rico Junker, RN as Registered Nurse Theodore Demark, RN as Registered Nurse Sindy Guadeloupe, MD as Consulting Physician (Hematology and Oncology)    REASON FOR CONSULTATION: Sophia [name] is a 40 y.o. 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  Patient has history of pathologic fracture to spine status post kyphoplasty.  She also underwent prophylactic fixation of the right hip due to impending fracture.  She has had significant depression and anxiety.  She was referred to palliative care to help address goals and manage ongoing symptoms.  SOCIAL HISTORY:     reports that she has never smoked. She has never used smokeless tobacco. She reports previous alcohol use. She reports previous drug use.  Patient is from [place].  She speaks [place].  She lives here with her 3 children ages 6-15.  She has no family or friend/social support locally.  She is unable to work.  ADVANCE DIRECTIVES:  Does not have  CODE STATUS:   PAST MEDICAL HISTORY: Past Medical History:  Diagnosis Date  . Anxiety   . Breast cancer ([place])    with mets  . Cancer ([place])   . Colitis   . COVID-19 in immunocompromised patient ([place])   . Family history of colon cancer   . Vertigo     PAST SURGICAL HISTORY:  Past Surgical History:  Procedure Laterality Date  . BREAST BIOPSY Left 08/14/2019   [place] bx of mass, coil marker, path pending  . BREAST BIOPSY Left 08/14/2019   [place] bx of LN, hydromarker, path pending  . BREAST BIOPSY Left 08/14/2019   affirm bx of calcs, x marker, path pending  . ESOPHAGOGASTRODUODENOSCOPY (EGD) WITH PROPOFOL N/A 10/05/2019   Procedure:  ESOPHAGOGASTRODUODENOSCOPY (EGD) WITH PROPOFOL;  Surgeon: Lin Landsman, MD;  Location: [facility];  Service: Gastroenterology;  Laterality: N/A;  . FLEXIBLE SIGMOIDOSCOPY N/A 10/05/2019   Procedure: FLEXIBLE SIGMOIDOSCOPY;  Surgeon: Lin Landsman, MD;  [facility] END[facility];  Service: Gastroenterology;  Laterality: N/A;  . INTRAMEDULLARY (IM) NAIL INTERTROCHANTERIC Right 09/01/2019   Procedure: INTRAMEDULLARY (IM) NAIL INTERTROCHANTRIC AND RADIOFREQUENCY ABLATION;  Surgeon: Hessie Knows, MD;  Location: [facility] ORS;  Service: Orthopedics;  Laterality: Right;  . KYPHOPLASTY N/A 08/29/2019   Procedure: KYPHOPLASTY T6, L1,L4 ,  RADIOFREQUENCY ABLATION;  Surgeon: Hessie Knows, MD;  Location: [facility] ORS;  Service: Orthopedics;  Laterality: N/A;  . KYPHOPLASTY Right 09/01/2019   Procedure: Right Sacral Radiofrequency Ablation and Cement Augmentation, Right sacrum and iliac crest;  Surgeon: Hessie Knows, MD;  Location: [facility] ORS;  Service: Orthopedics;  Laterality: Right;  . PORTA CATH INSERTION N/A 08/28/2019   Procedure: PORTA CATH INSERTION;  Surgeon: Algernon Huxley, MD;  Location: [facility] LAB;  Service: Cardiovascular;  Laterality: N/A;    HEMATOLOGY/ONCOLOGY HISTORY:  Oncology History  Metastatic breast cancer ([place])  08/29/2019 Initial Diagnosis   Metastatic breast cancer ([place])   09/04/2019 -  Chemotherapy    Patient is on Treatment Plan: BREAST WEEKLY PACLITAXEL + TRASTUZUMAB + PERTUZUMAB Q21D       Genetic Testing   Negative genetic testing. No pathogenic variants identified on the [facility] CancerNext-Expanded+RNA panel. The report date is 04/13/2020.   The CancerNext-Expanded + RNAinsight gene panel offered  by Althia Forts and includes sequencing and rearrangement analysis for the following 77 genes: IP, ALK, APC*, ATM*, AXIN2, BAP1, BARD1, BLM, BMPR1A, BRCA1*, BRCA2*, BRIP1*, CDC73, CDH1*,CDK4, CDKN1B, CDKN2A, CHEK2*, CTNNA1, DICER1, FANCC, FH, FLCN, GALNT12, KIF1B, LZTR1,  MAX, MEN1, MET, MLH1*, MSH2*, MSH3, MSH6*, MUTYH*, NBN, NF1*, NF2, NTHL1, PALB2*, PHOX2B, PMS2*, POT1, PRKAR1A, PTCH1, PTEN*, RAD51C*, RAD51D*,RB1, RECQL, RET, SDHA, SDHAF2, SDHB, SDHC, SDHD, SMAD4, SMARCA4, SMARCB1, SMARCE1, STK11, SUFU, TMEM127, TP53*,TSC1, TSC2, VHL and XRCC2 (sequencing and deletion/duplication); EGFR, EGLN1, HOXB13, KIT, MITF, PDGFRA, POLD1 and POLE (sequencing only); EPCAM and GREM1 (deletion/duplication only). DNA and RNA analyses performed for * genes.     ALLERGIES:  has No Known Allergies.  MEDICATIONS:  Current Outpatient Medications  Medication Sig Dispense Refill  . chlorpheniramine-HYDROcodone (TUSSIONEX) 10-8 MG/5ML SUER Take 5 mLs by mouth every 12 (twelve) hours as needed for cough. 140 mL 0  . clindamycin (CLINDAGEL) 1 % gel Apply topically 2 (two) times daily. X 7 days. 30 g 0  . COVID-19 mRNA vaccine, Pfizer, 30 MCG/0.3ML injection See admin instructions. (Patient not taking: Reported on 08/13/2020)    . diphenoxylate-atropine (LOMOTIL) 2.5-0.025 MG tablet Take 1 tablet by mouth 4 (four) times daily as needed for diarrhea or loose stools. (Patient not taking: No sig reported) 40 tablet 0  . ergocalciferol (VITAMIN D2) 1.25 MG (50000 UT) capsule Take 1 capsule (50,000 Units total) by mouth once a week. For 4 weeks. Then 1 capsule once monthly for 4 months 8 capsule 0  . FLUoxetine (PROZAC) 10 MG capsule Take 1 capsule (10 mg total) by mouth daily. 30 capsule 3  . gabapentin (NEURONTIN) 600 MG tablet Take 1 tablet (600 mg total) by mouth 3 (three) times daily. 90 tablet 2  . lidocaine-prilocaine (EMLA) cream Apply 1 application topically as needed. Apply small amount to port site at least 1 hour prior to it being accessed, cover with plastic wrap 30 g 1  . ondansetron (ZOFRAN) 8 MG tablet Take 1 tablet (8 mg total) by mouth every 8 (eight) hours as needed for nausea or vomiting. 45 tablet 2  . oxycodone (OXY-IR) 5 MG capsule Take 1-2 capsules (5-10 mg total) by  mouth every 4 (four) hours as needed. 90 capsule 0  . oxyCODONE (OXYCONTIN) 10 mg 12 hr tablet Take 1 tablet (10 mg total) by mouth every 12 (twelve) hours. 60 tablet 0  . potassium chloride SA (KLOR-CON) 20 MEQ tablet Take by mouth. (Patient not taking: No sig reported)    . prochlorperazine (COMPAZINE) 10 MG tablet Take 1 tablet (10 mg total) by mouth every 6 (six) hours as needed for nausea or vomiting. 45 tablet 2  . traZODone (DESYREL) 50 MG tablet Take 1-2 tablets (50-100 mg total) by mouth at bedtime as needed for sleep. 60 tablet 2   No current facility-administered medications for this visit.   Facility-Administered Medications Ordered in Other Visits  Medication Dose Route Frequency Provider Last Rate Last Admin  . 0.9 %  sodium chloride infusion   Intravenous Continuous Jacquelin Hawking, NP   Stopped at 06/02/20 1453  . heparin lock flush 100 unit/mL  500 Units Intravenous Once Sindy Guadeloupe, MD        VITAL SIGNS: There were no vitals taken for this visit. There were no vitals filed for this visit.  Estimated body mass index is 25.68 kg/m as calculated from the following:   Height as of 07/02/20: 5' (1.524 m).   Weight as of 07/30/20: 131 lb  8 oz (59.6 kg).  LABS: CBC:    Component Value Date/Time   WBC 8.3 08/13/2020 1013   HGB 10.1 (L) 08/13/2020 1013   HCT 32.1 (L) 08/13/2020 1013   PLT 471 (H) 08/13/2020 1013   MCV 80.7 08/13/2020 1013   NEUTROABS 5.8 08/13/2020 1013   LYMPHSABS 1.5 08/13/2020 1013   MONOABS 0.7 08/13/2020 1013   EOSABS 0.2 08/13/2020 1013   BASOSABS 0.0 08/13/2020 1013   Comprehensive Metabolic Panel:    Component Value Date/Time   NA 139 08/13/2020 1013   K 3.7 08/13/2020 1013   CL 105 08/13/2020 1013   CO2 23 08/13/2020 1013   BUN 9 08/13/2020 1013   CREATININE 0.45 08/13/2020 1013   GLUCOSE 94 08/13/2020 1013   CALCIUM 8.4 (L) 08/13/2020 1013   AST 21 08/13/2020 1013   ALT 15 08/13/2020 1013   ALKPHOS 52 08/13/2020 1013   BILITOT  0.5 08/13/2020 1013   PROT 6.5 08/13/2020 1013   ALBUMIN 3.6 08/13/2020 1013    RADIOGRAPHIC STUDIES: No results found.  PERFORMANCE STATUS (ECOG) : 1 - Symptomatic but completely ambulatory  Review of Systems Unless otherwise noted, a complete review of systems is negative.  Physical Exam General: NAD Cardiac: RRR Pulmonary: Clear to auscultation anterior/posterior fields Abdomen: Soft, nontender to palp, bowel sounds present Extremities: no edema, no joint deformities Skin: no rashes Neurological: Weakness but otherwise nonfocal  IMPRESSION: Routine follow-up.  Patient recently had COVID and is still recovering.  She completed most of the course of Molnupiravir but had a few pills left from where she was only taking once daily instead of twice daily as ordered.   She still has some residual dry cough and fatigue but denies fever, chills, or shortness of breath.  She does endorse occasional wheezing.  Overall, she feels improved.  Moods and pain are stable.  PLAN: -Continue current scope of treatment -Continue Prozac 10 mg daily -Refill OxyContin, Lomotil, and Tussionex -Will send Rx for albuterol inhaler for PRN wheezing -Follow-up MyChart visit in 1-2 months  Case and plan discussed with Dr. Janese Banks  Patient expressed understanding and was in agreement with this plan. She also understands that She can call the clinic at any time with any questions, concerns, or complaints.     Time Total: 15 minutes  Visit consisted of counseling and education dealing with the complex and emotionally intense issues of symptom management and palliative care in the setting of serious and potentially life-threatening illness.Greater than 50%  of this time was spent counseling and coordinating care related to the above assessment and plan.  Signed by: Altha Harm, PhD, NP-C

## 2020-08-13 NOTE — Progress Notes (Signed)
Hematology/Oncology Consult note [facility]  [phone] [phone]  Patient Care Team: [facility], NP as PCP - General Jim Like, RN as Registered Nurse Scarlett Presto, RN as Registered Nurse Creig Hines, MD as Consulting Physician (Hematology and Oncology)   Name of the patient: Sophia [name]  [id number]  26-Feb-1981   Date of visit: 08/13/20  Diagnosis- stage IV metastatic breast cancer ER/PR negative HER-2/neu positive with bone metastases   Chief complaint/ Reason for visit-on treatment assessment prior to cycle 17-day 1 of Taxol Herceptin and Perjeta  Heme/Onc history: patient is a 40 year old Hispanic female who is here with her friend. History obtained with the help of an interpreter.Patient self palpated left breast mass which was followed by a diagnostic bilateral mammogram. Mammogram showed 3.1 x 2.9 x 1.9 cm hypoechoic mass at the 1 o'clock position of the left breast. For abnormal cortically thickened left axillary lymph nodes measuring up to 5 mm. 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 Patient was also having ongoing back pain and was seen by [facility] orthopedics Dr. Izola Price who ordered MRI lumbar spine without contrast which showed possible pathologic fractures of L1 and L4 vertebral bodies with greater than 50% height loss at L1 and abnormal signal involving L2-L3 S1 as well as right iliac bone concerning for metastatic disease.Patient is a single mother of 3 adult children and is very anxious today.She reports significant back pain which radiates to her bilateral thighs. Denies any focal tingling numbness or weakness. Denies any bowel bladder incontinence. Pain has been uncontrolled despite taking Tylenol. No prior history of abnormal breast biopsies. No family history  of breast cancer  PET and MRIshowed 3 areas of pathologic fracture of her spine as well as widespread bony metastatic disease and concern for impending fracture of the right hip. Given her worsening pain she was asked to come to the ER. She has been evaluated by Dr. Rosita Kea from orthopedic surgery and underwent kyphoplasty at 3 different levels.T6 L1 and L4 along with radiofrequency ablation.She also underwent prophylactic fixation of the right hip and not affected the sacral region.   Patient received first dose of Herceptin and Perjeta on 09/04/2019. Baseline echocardiogram normal.she is currently getting taxol/ herceptin/perjeta  Patient admitted to hospital for acute abdominal pain with CT findings concerning for acute colitis. Colonoscopy showed diffuse severe inflammation with erythema friability and loss of vascularity and shallow ulcerations in the IC valve, ascending colon and cecum.  Interval history-patient has some residual nasal congestion and wheezing.  She tested positive for COVID on 08/02/2020 and was started on antiviral medication the next day  ECOG PS- 1 Pain scale- 0 Opioid associated constipation- no  Review of systems- Review of Systems  HENT: Positive for congestion and sore throat.   Respiratory: Positive for cough.        No Known Allergies   Past Medical History:  Diagnosis Date  . Anxiety   . Breast cancer (HCC)    with mets  . Cancer (HCC)   . Colitis   . COVID-19 in immunocompromised patient (HCC)   . Family history of colon cancer   . Vertigo      Past Surgical History:  Procedure Laterality Date  . BREAST BIOPSY Left 08/14/2019   [place] bx of mass, coil marker, path pending  . BREAST  BIOPSY Left 08/14/2019   [place] bx of LN, hydromarker, path pending  . BREAST BIOPSY Left 08/14/2019   affirm bx of calcs, x marker, path pending  . ESOPHAGOGASTRODUODENOSCOPY (EGD) WITH PROPOFOL N/A 10/05/2019   Procedure: ESOPHAGOGASTRODUODENOSCOPY (EGD)  WITH PROPOFOL;  Surgeon: Lin Landsman, MD;  Location: [place];  Service: Gastroenterology;  Laterality: N/A;  . FLEXIBLE SIGMOIDOSCOPY N/A 10/05/2019   Procedure: FLEXIBLE SIGMOIDOSCOPY;  Surgeon: Lin Landsman, MD;  Location: [facility] ENDOSCOPY;  Service: Gastroenterology;  Laterality: N/A;  . INTRAMEDULLARY (IM) NAIL INTERTROCHANTERIC Right 09/01/2019   Procedure: INTRAMEDULLARY (IM) NAIL INTERTROCHANTRIC AND RADIOFREQUENCY ABLATION;  Surgeon: Hessie Knows, MD;  Location: [facility] ORS;  Service: Orthopedics;  Laterality: Right;  . KYPHOPLASTY N/A 08/29/2019   Procedure: KYPHOPLASTY T6, L1,L4 ,  RADIOFREQUENCY ABLATION;  Surgeon: Hessie Knows, MD;  Location: [facility] ORS;  Service: Orthopedics;  Laterality: N/A;  . KYPHOPLASTY Right 09/01/2019   Procedure: Right Sacral Radiofrequency Ablation and Cement Augmentation, Right sacrum and iliac crest;  Surgeon: Hessie Knows, MD;  Location: [facility] ORS;  Service: Orthopedics;  Laterality: Right;  . PORTA CATH INSERTION N/A 08/28/2019   Procedure: PORTA CATH INSERTION;  Surgeon: Algernon Huxley, MD;  Location: [place] CV LAB;  Service: Cardiovascular;  Laterality: N/A;    Social History   Socioeconomic History  . Marital status: Single    Spouse name: Not on file  . Number of children: Not on file  . Years of education: Not on file  . Highest education level: Not on file  Occupational History  . Not on file  Tobacco Use  . Smoking status: Never Smoker  . Smokeless tobacco: Never Used  Vaping Use  . Vaping Use: Never used  Substance and Sexual Activity  . Alcohol use: Not Currently  . Drug use: Not Currently  . Sexual activity: Not Currently    Birth control/protection: None  Other Topics Concern  . Not on file  Social History Narrative   Lives at home with children   Social Determinants of Health   Financial Resource Strain: Not on file  Food Insecurity: Not on file  Transportation Needs: Not on file  Physical Activity: Not  on file  Stress: Not on file  Social Connections: Not on file  Intimate Partner Violence: Not on file    Family History  Problem Relation Age of Onset  . Colon cancer Maternal Uncle      Current Outpatient Medications:  .  clindamycin (CLINDAGEL) 1 % gel, Apply topically 2 (two) times daily. X 7 days., Disp: 30 g, Rfl: 0 .  ergocalciferol (VITAMIN D2) 1.25 MG (50000 UT) capsule, Take 1 capsule (50,000 Units total) by mouth once a week. For 4 weeks. Then 1 capsule once monthly for 4 months, Disp: 8 capsule, Rfl: 0 .  FLUoxetine (PROZAC) 10 MG capsule, Take 1 capsule (10 mg total) by mouth daily., Disp: 30 capsule, Rfl: 3 .  gabapentin (NEURONTIN) 600 MG tablet, Take 1 tablet (600 mg total) by mouth 3 (three) times daily., Disp: 90 tablet, Rfl: 2 .  lidocaine-prilocaine (EMLA) cream, Apply 1 application topically as needed. Apply small amount to port site at least 1 hour prior to it being accessed, cover with plastic wrap, Disp: 30 g, Rfl: 1 .  ondansetron (ZOFRAN) 8 MG tablet, Take 1 tablet (8 mg total) by mouth every 8 (eight) hours as needed for nausea or vomiting., Disp: 45 tablet, Rfl: 2 .  oxycodone (OXY-IR) 5 MG capsule, Take 1-2 capsules (5-10 mg  total) by mouth every 4 (four) hours as needed., Disp: 90 capsule, Rfl: 0 .  prochlorperazine (COMPAZINE) 10 MG tablet, Take 1 tablet (10 mg total) by mouth every 6 (six) hours as needed for nausea or vomiting., Disp: 45 tablet, Rfl: 2 .  traZODone (DESYREL) 50 MG tablet, Take 1-2 tablets (50-100 mg total) by mouth at bedtime as needed for sleep., Disp: 60 tablet, Rfl: 2 .  albuterol (VENTOLIN HFA) 108 (90 Base) MCG/ACT inhaler, Inhale 2 puffs into the lungs every 6 (six) hours as needed for wheezing or shortness of breath., Disp: 8 g, Rfl: 2 .  chlorpheniramine-HYDROcodone (TUSSIONEX) 10-8 MG/5ML SUER, Take 5 mLs by mouth every 12 (twelve) hours as needed for cough., Disp: 140 mL, Rfl: 0 .  COVID-19 mRNA vaccine, Pfizer, 30 MCG/0.3ML  injection, See admin instructions. (Patient not taking: Reported on 08/13/2020), Disp: , Rfl:  .  diphenoxylate-atropine (LOMOTIL) 2.5-0.025 MG tablet, Take 1 tablet by mouth 4 (four) times daily as needed for diarrhea or loose stools., Disp: 40 tablet, Rfl: 0 .  oxyCODONE (OXYCONTIN) 10 mg 12 hr tablet, Take 1 tablet (10 mg total) by mouth every 12 (twelve) hours., Disp: 60 tablet, Rfl: 0 .  potassium chloride SA (KLOR-CON) 20 MEQ tablet, Take by mouth. (Patient not taking: No sig reported), Disp: , Rfl:  No current facility-administered medications for this visit.  Facility-Administered Medications Ordered in Other Visits:  .  0.9 %  sodium chloride infusion, , Intravenous, Continuous, Burns, Wandra Feinstein, NP, Stopped at 06/02/20 1453 .  heparin lock flush 100 unit/mL, 500 Units, Intravenous, Once, Sindy Guadeloupe, MD  Physical exam:  Physical Exam Constitutional:      General: She is not in acute distress. Cardiovascular:     Rate and Rhythm: Normal rate and regular rhythm.     Heart sounds: Normal heart sounds.  Pulmonary:     Effort: Pulmonary effort is normal.  Skin:    General: Skin is warm and dry.  Neurological:     Mental Status: She is alert and oriented to person, place, and time.      CMP Latest Ref Rng & Units 08/13/2020  Glucose 70 - 99 mg/dL 94  BUN 6 - 20 mg/dL 9  Creatinine 0.44 - 1.00 mg/dL 0.45  Sodium 135 - 145 mmol/L 139  Potassium 3.5 - 5.1 mmol/L 3.7  Chloride 98 - 111 mmol/L 105  CO2 22 - 32 mmol/L 23  Calcium 8.9 - 10.3 mg/dL 8.4(L)  Total Protein 6.5 - 8.1 g/dL 6.5  Total Bilirubin 0.3 - 1.2 mg/dL 0.5  Alkaline Phos 38 - 126 U/L 52  AST 15 - 41 U/L 21  ALT 0 - 44 U/L 15   CBC Latest Ref Rng & Units 08/13/2020  WBC 4.0 - 10.5 K/uL 8.3  Hemoglobin 12.0 - 15.0 g/dL 10.1(L)  Hematocrit 36.0 - 46.0 % 32.1(L)  Platelets 150 - 400 K/uL 471(H)      Assessment and plan- Patient is a 40 y.o. female with metastatic ER/PR negative HER-2 positive breast  cancer with bone metastases.    She is here for on treatment assessment prior to cycle 17-day 1 of Taxol Herceptin and Perjeta  I would like to wait for 3 weeks from the time she tested positive for COVID before restarting chemotherapy.  I will therefore hold off on giving her chemotherapy today  Port labs CBC with differential CMP and Taxol Herceptin Perjeta to be given on 08/27/2020.  Taxol alone on 09/03/2020.  I  will see her back on 09/17/2020 with labs for Taxol Herceptin and Perjeta.  Patient will receive Xgeva on 08/27/2020 as well.  Plan to repeat scans in July along with echocardiogram  Neoplasm related pain: Continue OxyContin and as needed oxycodone  Nasal congestion/cough: Was also seen by palliative care today and was started on Tussionex and albuterol inhaler will be sent as well.  She will continue over-the-counter nasal decongestions and Cepacol   Visit Diagnosis 1. Encounter for antineoplastic chemotherapy   2. Encounter for monoclonal antibody treatment for malignancy   3. Primary cancer of left breast with metastasis to other site ([place])   4. History of COVID-19      Dr. Randa Evens, MD, MPH [facility] at [facility] [phone] 08/13/2020 1:43 PM

## 2020-08-19 ENCOUNTER — Inpatient Hospital Stay: Payer: Self-pay

## 2020-08-24 ENCOUNTER — Other Ambulatory Visit: Payer: Self-pay

## 2020-08-24 MED ORDER — GABAPENTIN 600 MG PO TABS: 600.0000 mg | ORAL_TABLET | Freq: Three times a day (TID) | ORAL | 2 refills | Status: DC

## 2020-08-24 NOTE — Telephone Encounter (Signed)
Pt called to refill gabapentin got confirmation from Lorretta Harp and sent rx for pharmacy calll pt back to tell her it was done DC

## 2020-08-25 ENCOUNTER — Other Ambulatory Visit: Payer: Self-pay | Admitting: *Deleted

## 2020-08-27 ENCOUNTER — Other Ambulatory Visit: Payer: Self-pay

## 2020-08-27 ENCOUNTER — Inpatient Hospital Stay: Payer: Self-pay

## 2020-08-27 ENCOUNTER — Ambulatory Visit: Payer: Self-pay | Admitting: Oncology

## 2020-08-27 ENCOUNTER — Inpatient Hospital Stay (HOSPITAL_BASED_OUTPATIENT_CLINIC_OR_DEPARTMENT_OTHER): Payer: Self-pay | Admitting: Oncology

## 2020-08-27 ENCOUNTER — Ambulatory Visit: Payer: Self-pay

## 2020-08-27 ENCOUNTER — Encounter: Payer: Self-pay | Admitting: Oncology

## 2020-08-27 ENCOUNTER — Inpatient Hospital Stay: Payer: Self-pay | Attending: Hospice and Palliative Medicine

## 2020-08-27 VITALS — BP 114/77 | HR 75 | Temp 98.2°F | Resp 20 | Wt 132.1 lb

## 2020-08-27 DIAGNOSIS — G893 Neoplasm related pain (acute) (chronic): Secondary | ICD-10-CM

## 2020-08-27 DIAGNOSIS — Z800 Family history of malignant neoplasm of digestive organs: Secondary | ICD-10-CM | POA: Insufficient documentation

## 2020-08-27 DIAGNOSIS — Z79899 Other long term (current) drug therapy: Secondary | ICD-10-CM | POA: Insufficient documentation

## 2020-08-27 DIAGNOSIS — M8440XA Pathological fracture, unspecified site, initial encounter for fracture: Secondary | ICD-10-CM

## 2020-08-27 DIAGNOSIS — E876 Hypokalemia: Secondary | ICD-10-CM | POA: Insufficient documentation

## 2020-08-27 DIAGNOSIS — C50412 Malignant neoplasm of upper-outer quadrant of left female breast: Secondary | ICD-10-CM | POA: Insufficient documentation

## 2020-08-27 DIAGNOSIS — C7951 Secondary malignant neoplasm of bone: Secondary | ICD-10-CM

## 2020-08-27 DIAGNOSIS — C50919 Malignant neoplasm of unspecified site of unspecified female breast: Secondary | ICD-10-CM

## 2020-08-27 DIAGNOSIS — Z171 Estrogen receptor negative status [ER-]: Secondary | ICD-10-CM | POA: Insufficient documentation

## 2020-08-27 DIAGNOSIS — Z5111 Encounter for antineoplastic chemotherapy: Secondary | ICD-10-CM | POA: Insufficient documentation

## 2020-08-27 DIAGNOSIS — Z5112 Encounter for antineoplastic immunotherapy: Secondary | ICD-10-CM | POA: Insufficient documentation

## 2020-08-27 DIAGNOSIS — C50912 Malignant neoplasm of unspecified site of left female breast: Secondary | ICD-10-CM

## 2020-08-27 LAB — CBC WITH DIFFERENTIAL/PLATELET
Abs Immature Granulocytes: 0.01 10*3/uL (ref 0.00–0.07)
Basophils Absolute: 0 10*3/uL (ref 0.0–0.1)
Basophils Relative: 0 %
Eosinophils Absolute: 0.2 10*3/uL (ref 0.0–0.5)
Eosinophils Relative: 4 %
HCT: 33.7 % — ABNORMAL LOW (ref 36.0–46.0)
Hemoglobin: 10.7 g/dL — ABNORMAL LOW (ref 12.0–15.0)
Immature Granulocytes: 0 %
Lymphocytes Relative: 27 %
Lymphs Abs: 1.2 10*3/uL (ref 0.7–4.0)
MCH: 25.1 pg — ABNORMAL LOW (ref 26.0–34.0)
MCHC: 31.8 g/dL (ref 30.0–36.0)
MCV: 79.1 fL — ABNORMAL LOW (ref 80.0–100.0)
Monocytes Absolute: 0.4 10*3/uL (ref 0.1–1.0)
Monocytes Relative: 8 %
Neutro Abs: 2.7 10*3/uL (ref 1.7–7.7)
Neutrophils Relative %: 61 %
Platelets: 366 10*3/uL (ref 150–400)
RBC: 4.26 MIL/uL (ref 3.87–5.11)
RDW: 17.9 % — ABNORMAL HIGH (ref 11.5–15.5)
WBC: 4.5 10*3/uL (ref 4.0–10.5)
nRBC: 0 % (ref 0.0–0.2)

## 2020-08-27 LAB — COMPREHENSIVE METABOLIC PANEL
ALT: 25 U/L (ref 0–44)
AST: 36 U/L (ref 15–41)
Albumin: 3.5 g/dL (ref 3.5–5.0)
Alkaline Phosphatase: 84 U/L (ref 38–126)
Anion gap: 10 (ref 5–15)
BUN: 14 mg/dL (ref 6–20)
CO2: 21 mmol/L — ABNORMAL LOW (ref 22–32)
Calcium: 8.5 mg/dL — ABNORMAL LOW (ref 8.9–10.3)
Chloride: 105 mmol/L (ref 98–111)
Creatinine, Ser: 0.55 mg/dL (ref 0.44–1.00)
GFR, Estimated: >60 mL/min (ref >60)
Glucose, Bld: 135 mg/dL — ABNORMAL HIGH (ref 70–99)
Potassium: 3.3 mmol/L — ABNORMAL LOW (ref 3.5–5.1)
Sodium: 136 mmol/L (ref 135–145)
Total Bilirubin: 0.5 mg/dL (ref 0.3–1.2)
Total Protein: 7.3 g/dL (ref 6.5–8.1)

## 2020-08-27 MED ORDER — SODIUM CHLORIDE 0.9 % IV SOLN
Freq: Once | INTRAVENOUS | Status: AC
  Filled 2020-08-27: qty 250

## 2020-08-27 MED ORDER — SODIUM CHLORIDE 0.9 % IV SOLN
20.0000 mg | Freq: Once | INTRAVENOUS | Status: AC
  Administered 2020-08-27: 20 mg via INTRAVENOUS
  Filled 2020-08-27: qty 20

## 2020-08-27 MED ORDER — SODIUM CHLORIDE 0.9 % IV SOLN
420.0000 mg | Freq: Once | INTRAVENOUS | Status: AC
  Administered 2020-08-27: 420 mg via INTRAVENOUS
  Filled 2020-08-27: qty 14

## 2020-08-27 MED ORDER — HEPARIN SOD (PORK) LOCK FLUSH 100 UNIT/ML IV SOLN
500.0000 [IU] | Freq: Once | INTRAVENOUS | Status: AC | PRN
  Administered 2020-08-27: 500 [IU]
  Filled 2020-08-27: qty 5

## 2020-08-27 MED ORDER — HEPARIN SOD (PORK) LOCK FLUSH 100 UNIT/ML IV SOLN
INTRAVENOUS | Status: AC
  Filled 2020-08-27: qty 5

## 2020-08-27 MED ORDER — SODIUM CHLORIDE 0.9 % IV SOLN
65.0000 mg/m2 | Freq: Once | INTRAVENOUS | Status: AC
  Administered 2020-08-27: 108 mg via INTRAVENOUS
  Filled 2020-08-27: qty 18

## 2020-08-27 MED ORDER — DIPHENHYDRAMINE HCL 50 MG/ML IJ SOLN
50.0000 mg | Freq: Once | INTRAMUSCULAR | Status: AC
  Administered 2020-08-27: 50 mg via INTRAVENOUS
  Filled 2020-08-27: qty 1

## 2020-08-27 MED ORDER — SODIUM CHLORIDE 0.9% FLUSH
10.0000 mL | INTRAVENOUS | Status: DC | PRN
Start: 2020-08-27 — End: 2020-08-27
  Filled 2020-08-27: qty 10

## 2020-08-27 MED ORDER — FAMOTIDINE 20 MG IN NS 100 ML IVPB
20.0000 mg | Freq: Once | INTRAVENOUS | Status: AC
  Administered 2020-08-27: 20 mg via INTRAVENOUS
  Filled 2020-08-27: qty 20

## 2020-08-27 MED ORDER — ACETAMINOPHEN 325 MG PO TABS
650.0000 mg | ORAL_TABLET | Freq: Once | ORAL | Status: AC
  Administered 2020-08-27: 650 mg via ORAL
  Filled 2020-08-27: qty 2

## 2020-08-27 MED ORDER — TRASTUZUMAB-ANNS CHEMO 150 MG IV SOLR
6.0000 mg/kg | Freq: Once | INTRAVENOUS | Status: AC
  Administered 2020-08-27: 357 mg via INTRAVENOUS
  Filled 2020-08-27: qty 17

## 2020-08-27 MED ORDER — DENOSUMAB 120 MG/1.7ML ~~LOC~~ SOLN
120.0000 mg | Freq: Once | SUBCUTANEOUS | Status: AC
Start: 2020-08-27 — End: 2020-08-27
  Administered 2020-08-27: 120 mg via SUBCUTANEOUS
  Filled 2020-08-27: qty 1.7

## 2020-08-27 NOTE — Patient Instructions (Addendum)
[facility]  Discharge Instructions: Thank you for choosing [facility] to provide your oncology and hematology care.  If you have a lab appointment with the [facility], please go directly to the [facility] and check in at the registration area.  Wear comfortable clothing and clothing appropriate for easy access to any Portacath or PICC line.   We strive to give you quality time with your provider. You may need to reschedule your appointment if you arrive late (15 or more minutes).  Arriving late affects you and other patients whose appointments are after yours.  Also, if you miss three or more appointments without notifying the office, you may be dismissed from the clinic at the provider's discretion.      For prescription refill requests, have your pharmacy contact our office and allow 72 hours for refills to be completed.    Today you received the following chemotherapy and/or immunotherapy agents - trastuzumab, pertuzumab, paclitaxel   To help prevent nausea and vomiting after your treatment, we encourage you to take your nausea medication as directed.  BELOW ARE SYMPTOMS THAT SHOULD BE REPORTED IMMEDIATELY: *FEVER GREATER THAN 100.4 F (38 C) OR HIGHER *CHILLS OR SWEATING *NAUSEA AND VOMITING THAT IS NOT CONTROLLED WITH YOUR NAUSEA MEDICATION *UNUSUAL SHORTNESS OF BREATH *UNUSUAL BRUISING OR BLEEDING *URINARY PROBLEMS (pain or burning when urinating, or frequent urination) *BOWEL PROBLEMS (unusual diarrhea, constipation, pain near the anus) TENDERNESS IN MOUTH AND THROAT WITH OR WITHOUT PRESENCE OF ULCERS (sore throat, sores in mouth, or a toothache) UNUSUAL RASH, SWELLING OR PAIN  UNUSUAL VAGINAL DISCHARGE OR ITCHING   Items with * indicate a potential emergency and should be followed up as soon as possible or go to the Emergency Department if any problems should occur.  Please show the CHEMOTHERAPY ALERT CARD or IMMUNOTHERAPY  ALERT CARD at check-in to the Emergency Department and triage nurse.  Should you have questions after your visit or need to cancel or reschedule your appointment, please contact [facility]  [phone] and follow the prompts.  Office hours are 8:00 a.m. to 4:30 p.m. Monday - Friday. Please note that voicemails left after 4:00 p.m. may not be returned until the following business day.  We are closed weekends and major holidays. You have access to a nurse at all times for urgent questions. Please call the main number to the clinic [phone] and follow the prompts.  For any non-urgent questions, you may also contact your provider using MyChart. We now offer e-Visits for anyone 60 and older to request care online for non-urgent symptoms. For details visit [email or web address].   Also download the MyChart app! Go to the app store, search "MyChart", open the app, select [facility], and log in with your MyChart username and password.  Due to Covid, a mask is required upon entering the hospital/clinic. If you do not have a mask, one will be given to you upon arrival. For doctor visits, patients may have 1 support person aged 62 or older with them. For treatment visits, patients cannot have anyone with them due to current Covid guidelines and our immunocompromised population.   Trastuzumab injection for infusion Qu es este medicamento? El TRASTUZUMAB es un anticuerpo monoclonal. Genene Churn para tratar el cncer demama y de [place]. Este medicamento puede ser utilizado para otros usos; si tiene Engineer, petroleum preguntaconsulte con su proveedor de atencin mdica o con su farmacutico. MARCAS COMUNES: Herceptin, Margarita Mail  le debo informar a mi profesional de la salud antes de tomar estemedicamento? Necesitan saber si usted presenta alguno de los siguientes problemas osituaciones: enfermedad cardiaca insuficiencia cardiaca  enfermedad pulmonar o respiratoria, como asma una reaccin alrgica o inusual al trastuzumab, al alcohol benclico, a otros medicamentos, alimentos, colorantes o conservantes si est embarazada obuscando quedar embarazada si est amamantando a un beb Cmo debo utilizar este medicamento? Este medicamento se administra mediante infusin por va intravenosa. Lo administra un profesional de Technical sales engineer calificado en un hospital o en un entornoclnico. Hable con su pediatra para informarse acerca del uso de este medicamento ennios. Este medicamento no est aprobado para uso en nios. Sobredosis: Pngase en contacto inmediatamente con un centro toxicolgico o unasala de urgencia si usted cree que haya tomado demasiado medicamento. ATENCIN: [employer] es solo para usted. No comparta este medicamento connadie. Qu sucede si me olvido de una dosis? Es importante no olvidar ninguna dosis. Informe a su mdico o a su profesionalde la salud si no puede asistir a una cita. Qu puede interactuar con este medicamento? Este medicamento podra interactuar con los siguientes frmacos: ciertos tipos de quimioterapia, tales como daunorubicina, [place], e idarubicina Puede ser que esta lista no menciona todas las posibles interacciones. Informe a su profesional de KB Home	[place] de [employer] productos a base de hierbas, medicamentos de [place] o suplementos nutritivos que est tomando. Si usted fuma, consume bebidas alcohlicas o si utiliza drogas ilegales, indqueselo tambin a su profesional de KB Home	[place]. Algunas sustancias pueden interactuar consu medicamento. A qu debo estar atento al usar Coca[employer]Cola? Visite a su mdico para chequear su evolucin peridicamente. Si presenta algn efecto secundario, infrmelo. Sin embargo, contine con Dispensing optician aun sise siente enfermo, a menos que su mdico le indique que lo suspenda. Consulte a su mdico o a su profesional de la salud si tiene  fiebre, escalofros, dolor de garganta, o cualquier otro sntoma de resfro o gripe. Nose trate usted mismo. Trate de no acercarse a personas que estn enfermas. Es posible que tenga [place], escalofros y temblores durante su primera infusin. Estos efectos generalmente son leves y pueden tratarse con otros medicamentos. Informe cualquier efecto secundario durante la infusin a su profesional de KB Home	[place]. La fiebre y los escalofros por lo general no sucedencon las infusiones posteriores. No debe quedar embarazada mientras est tomando este medicamento o por 7 meses despus de dejar de usarlo. Las mujeres deben informar a su mdico si estn buscando quedar embarazadas o si creen que estn embarazadas. Las mujeres con la posibilidad de Best boy nios deben tener una prueba de embarazo negativa antes de Art gallery manager a tomar este medicamento. Existe la posibilidad de que ocurran efectos secundarios graves a un beb sin nacer. Para ms informacin hable con su profesional de la salud o su farmacutico. No debe amamantar a un bebmientras est tomando este medicamento o por 7 meses despus de dejar de usarlo. Las [employer] deben usar un mtodo anticonceptivo eficaz con este medicamento. Qu efectos secundarios puedo tener al [employer] este medicamento? Efectos secundarios que debe informar a su mdico o a su profesional de lasalud tan pronto como sea posible: Chief of Staff, como erupcin cutnea, comezn/picazn o urticaria, e hinchazn de la cara, los labios o la Holiday representative en el pecho o palpitaciones tos mareos sensacin de Youth worker o aturdimiento, cadas fiebre sensacin general de estar enfermo o sntomas gripales signos de empeoramiento de la insuficiencia cardiaca, tales como problemas respiratorios; hinchazn en laspiernas y los pies cansancio o  debilidad inusual Efectos secundarios que generalmente no requieren atencin mdica (infrmelos asu mdico o a su profesional de la salud si persisten o si son  molestos): dolor de Psychiatric nurse sentido del gusto Geophysicist/field seismologist en lasarticulaciones nuseas, vmito prdida de peso Puede ser que esta lista no menciona todos los posibles efectos secundarios. Comunquese a su mdico por asesoramiento mdico [employer]. Usted puede informar los efectos secundarios a la FDA por telfono al1-800-FDA-1088. Dnde debo guardar mi medicina? Este medicamento se administra en hospitales o clnicas y no necesitarguardarlo en su domicilio. ATENCIN: Este folleto es un resumen. Puede ser que no cubra toda la posible informacin. Si usted tiene preguntas acerca de esta medicina, consulte con sumdico, su farmacutico o su profesional de Technical sales engineer.  2022 Elsevier/Gold Standard (2019-09-09 00:00:00)  Pertuzumab injection Qu es este medicamento? El PERTUZUMAB es un anticuerpo monoclonal. Genene Churn para tratar el cncer demama. Este medicamento puede ser utilizado para otros usos; si tiene Engineer, petroleum preguntaconsulte con su proveedor de atencin mdica o con su farmacutico. MARCAS COMUNES: PERJETA Qu le debo informar a mi profesional de la salud antes de tomar estemedicamento? Necesita saber si usted presenta alguno de los siguientes problemas osituaciones: enfermedad cardiaca insuficiencia cardiaca alta presin sangunea antecedentes de pulso cardiaca irregular radioterapia reciente o continuada una reaccin alrgica o inusual al pertuzumab, a otros medicamentos, alimentos, colorantes o conservantes si est embarazada o buscando quedar embarazada si estamamantando a un beb Cmo debo utilizar este medicamento? Este medicamento se administra mediante infusin por va intravenosa. Loadministra un profesional de Technical sales engineer en un hospital o en un entorno clnico. Hable con su pediatra para informarse acerca del uso de este medicamento ennios. Puede requerir atencin especial. Sobredosis: Pngase en contacto inmediatamente con un centro toxicolgico o  unasala de urgencia si usted cree que haya tomado demasiado medicamento. ATENCIN: [employer] es solo para usted. No comparta este medicamento connadie. Qu sucede si me olvido de una dosis? Es importante no olvidar ninguna dosis. Informe a su mdico o a su profesionalde la salud si no puede asistir a una cita. Qu puede interactuar con este medicamento? No se esperan interacciones. Puede ser que esta lista no menciona todas las posibles interacciones. Informe a su profesional de KB Home	[place] de [employer] productos a base de hierbas, medicamentos de [place] o suplementos nutritivos que est tomando. Si usted fuma, consume bebidas alcohlicas o si utiliza drogas ilegales, indqueselo tambin a su profesional de KB Home	[place]. Algunas sustancias pueden interactuar consu medicamento. A qu debo estar atento al usar Coca-Cola? Se supervisar su estado de salud atentamente mientras reciba este medicamento. Si presenta algn efecto secundario, infrmelo. Sin embargo, contine con el tratamiento aun si se siente enfermo, a menos que su mdico le indique que losuspenda. No debe quedar embarazada mientras est tomando este medicamento o por 7 meses despus de dejar de usarlo. Las mujeres deben informar a su mdico si estn buscando quedar embarazadas o si creen que estn embarazadas. Las mujeres con la posibilidad de Best boy nios deben tener una prueba de embarazo negativa antes de Art gallery manager a tomar este medicamento. Existe la posibilidad de que ocurran efectos secundarios graves a un beb sin nacer. Para ms informacin hable con su profesional de la salud o su farmacutico. No debe amamantar a un bebmientras est tomando este medicamento o por 7 meses despus de dejar de usarlo. Las [employer] deben usar un mtodo anticonceptivo eficaz con este medicamento. Consulte a su mdico o a su  profesional de la salud si tiene fiebre, escalofros, dolor de [place], o cualquier otro sntoma de resfro o gripe. Nose  trate usted mismo. Trate de no acercarse a personas que estn enfermas. Es posible que tenga [place], escalofros y dolor de cabeza durante la infusin. Informe cualquier efecto secundario durante la infusin a su profesional de lasalud. Qu efectos secundarios puedo tener al [employer] este medicamento? Efectos secundarios que debe informar a su mdico o a su profesional de lasalud tan pronto como sea posible: Arboriculturist o palpitaciones mareos sensacin de Youth worker o aturdimiento fiebre o escalofros erupcin cutnea, picazn o urticaria dolor de garganta hinchazn de la cara, labios o lengua hinchazn depiernas o tobillos cansancio o debilidad inusual Efectos secundarios que generalmente no requieren atencin mdica (infrmelos asu mdico o a su profesional de la salud si persisten o si son molestos): diarrea cada del cabello nuseas, vmito cansancio Puede ser que esta lista no menciona todos los posibles efectos secundarios. Comunquese a su mdico por asesoramiento mdico [employer]. Usted puede informar los efectos secundarios a la FDA por telfono al1-800-FDA-1088. Dnde debo guardar mi medicina? Este medicamento se administra en hospitales o clnicas y no necesitarguardarlo en su domicilio. ATENCIN: Este folleto es un resumen. Puede ser que no cubra toda la posible informacin. Si usted tiene preguntas acerca de esta medicina, consulte con sumdico, su farmacutico o su profesional de Technical sales engineer.  2022 Elsevier/Gold Standard (2016-04-06 00:00:00)  Paclitaxel injection Qu es este medicamento? El PACLITAXEL es un agente quimioteraputico. Este medicamento acta sobre las clulas que se dividen rpidamente, como las clulas cancerosas, y finalmente provoca la muerte de estas clulas. Se utiliza en el tratamiento del cncer deovario, mama, pulmn, sarcoma de [place] y otros tipos de cncer. Este medicamento puede ser utilizado para otros usos;  si tiene [place] preguntaconsulte con su proveedor de atencin mdica o con su farmacutico. MARCAS COMUNES: Onxol, Taxol Qu le debo informar a mi profesional de la salud antes de tomar estemedicamento? Necesitan saber si usted presenta alguno de los siguientes problemas osituaciones: antecedentes de frecuencia cardiaca irregular enfermedad heptica recuentos sanguneos bajos, como baja cantidad de glbulos blancos, plaquetas o glbulos rojos enfermedad pulmonar o respiratoria, como asma hormigueo en las manos o los pies, u otro trastorno del sistema nervioso una reaccin alrgica o inusual al paclitaxel, al alcohol, al aceite de ricino polioxietilado, a otros medicamentos quimioteraputicos, a otros medicamentos, alimentos, colorantes o conservantes si est embarazada o buscando quedar embarazada si estamamantando a un beb Cmo debo [employer]? Este medicamento se administra como infusin en una vena. Un profesional de lasalud especialmente capacitado lo administra en un hospital o clnica. Hable con su pediatra para informarse acerca del uso de este medicamento ennios. Puede requerir atencin especial. Sobredosis: Pngase en contacto inmediatamente con un centro toxicolgico o unasala de urgencia si usted cree que haya tomado demasiado medicamento. ATENCIN: [employer] es solo para usted. No comparta este medicamento connadie. Qu sucede si me olvido de una dosis? Es importante no olvidar ninguna dosis. Informe a su mdico o a su profesionalde la salud si no puede asistir a una cita. Qu puede interactuar con este medicamento? No use este medicamento con ninguno de los siguientes frmacos: vacunas de virus vivos Este medicamento tambin puede interactuar con los siguientes frmacos: medicamentos antivirales para la hepatitis, VIH o SIDA ciertos antibiticos, tales como eritromicina y Ambulance person ciertos medicamentos para infecciones micticas, tales como itraconazol y  ketoconazol ciertos medicamentos para convulsiones, tales  como [place], fenobarbital y fenitona gemfibrozilnefazodona rifampicina hierba de [place] Puede ser que esta lista no menciona todas las posibles interacciones. Informe a su profesional de KB Home	[place] de [employer] productos a base de hierbas, medicamentos de [place] o suplementos nutritivos que est tomando. Si usted fuma, consume bebidas alcohlicas o si utiliza drogas ilegales, indqueselo tambin a su profesional de KB Home	[place]. Algunas sustancias pueden interactuar consu medicamento. A qu debo estar atento al usar Coca-Cola? Se supervisar su estado de salud atentamente mientras reciba este medicamento. Tendr que hacerse anlisis de sangre importantes mientras est usando estemedicamento. Este medicamento puede causar Chief of Staff graves. Para reducir su riesgo, necesitar tomar otro(s) medicamento(s) antes del tratamiento con este medicamento. Si tiene Chief of Staff como erupcin cutnea, comezn/picazn o urticaria, hinchazn del rostro, los labios, o la lengua,informe de inmediato a su mdico o profesional de Technical sales engineer. En algunos casos, podra recibir medicamentos adicionales para ayudarlo con losefectos secundarios. Siga todas las instrucciones para usarlos. Este medicamento podra hacerle sentir un Nurse, mental health. Esto es normal, ya que la quimioterapia puede afectar tanto a las clulas sanas como a las clulas cancerosas. Si presenta algn efecto secundario, infrmelo. Contine con el tratamiento aun si se siente enfermo, a menos que su mdico le indique que losuspenda. Llame a su mdico o a su profesional de la salud si tiene fiebre, escalofros o dolor de garganta, o cualquier otro sntoma de resfro o gripe. No se trate usted mismo. Este medicamento reduce la capacidad del cuerpo para combatirinfecciones. Trate de no acercarse a personas que estn enfermas. Este medicamento podra aumentar el riesgo de  moretones o sangrado. Consulte asu mdico o a su profesional de la salud si observa sangrados inusuales. Proceda con cuidado al cepillar sus dientes, usar hilo dental o Risk manager palillos para los dientes, ya que podra contraer una infeccin o Therapist, art con mayor facilidad. Si se somete a algn tratamiento dental, informe a su dentistaque est [employer]. Evite usar productos que contienen aspirina, acetaminofeno, ibuprofeno, naproxeno o ketoprofeno, a menos que as lo indique su mdico. Estos productospueden ocultar la fiebre. No debe quedar embarazada mientras est [employer]. Las mujeres deben informar a su mdico si estn buscando quedar embarazadas o si creen que podran estar embarazadas. Existe la posibilidad de efectos secundarios graves en un beb sin nacer. Para obtener ms informacin, hable con su profesional de la salud o su farmacutico. No debe amamantar a un beb mientras est usandoeste medicamento. Para los hombres, se desaconseja concebir hijos mientras reciben estemedicamento. Este producto podra contener alcohol. Pregunte a Midwife o a su proveedor de atencin de la salud si este medicamento contiene alcohol. Asegrese de decirles a todos los proveedores de atencin de la salud que usted est tomando [place]. Ciertos medicamentos, como metronidazol y disulfiram, pueden causar una reaccin desagradable cuando se usan con alcohol. Esta reaccin incluye enrojecimiento, dolor de cabeza, nuseas, vmitos, sudoracin y aumento de la sed. La reaccin puede durar de 30 minutos a variashoras. Qu efectos secundarios puedo tener al [employer] este medicamento? Efectos secundarios que debe informar a su mdico o a su profesional de lasalud tan pronto como sea posible: Chief of Staff, tales como erupcin cutnea, comezn/picazn o urticaria, e hinchazn de la cara, los labios o la lengua problemas para respirar cambios en la visin frecuencia cardiaca  rpida e irregular presin sangunea alta o baja llagas en la boca dolor, hormigueo o entumecimiento de las manos o los pies signos de disminucin en  la cantidad de plaquetas o sangrado: moretones, puntos rojos en la piel, heces de color negro y aspecto alquitranado, sangre en la orina signos de disminucin en la cantidad de glbulos rojos: debilidad o cansancio inusuales, sensacin de Secondary school teacher o aturdimiento, cadas signos de infeccin: fiebre o escalofros, tos, dolor de garganta, dolor o dificultad para orinar signos y sntomas de lesin en el hgado, tales como orina amarilla oscura o [place]; sensacin general de estar enfermo o sntomas gripales; heces claras; prdida del apetito; nuseas; dolor en la regin abdominal superior derecha; debilidad o cansancio inusuales; color amarillento de los ojos o lapiel hinchazn de tobillos, pies, manos frecuencia cardiaca inusualmente lenta Efectos secundarios que generalmente no requieren atencin mdica (infrmelos asu mdico o a su profesional de la salud si persisten o si son molestos): diarrea cada del cabello prdida del apetito dolores musculares o articulares nuseas, vmito dolor, enrojecimiento o Actor de la inyeccincansancio Puede ser que esta lista no menciona todos los posibles efectos secundarios. Comunquese a su mdico por asesoramiento mdico [employer]. Usted puede informar los efectos secundarios a la FDA por telfono al1-800-FDA-1088. Dnde debo guardar mi medicina? Este medicamento se administra en hospitales o clnicas, y no necesitarguardarlo en su domicilio. ATENCIN: Este folleto es un resumen. Puede ser que no cubra toda la posible informacin. Si usted tiene preguntas acerca de esta medicina, consulte con sumdico, su farmacutico o su profesional de Technical sales engineer.  2022 Elsevier/Gold Standard (2019-07-10 00:00:00)

## 2020-08-27 NOTE — Progress Notes (Signed)
Ca 8.5 - ok to proceed with xgeva per Beckey Rutter NP

## 2020-08-27 NOTE — Progress Notes (Signed)
Hematology/Oncology Consult note [facility]  Telephone:([phone] [phone]  Patient Care Team: [place], [place], NP as PCP - General Rico Junker, RN as Registered Nurse Theodore Demark, RN as Registered Nurse Sindy Guadeloupe, MD as Consulting Physician (Hematology and Oncology)   Name of the patient: Sophia [name]  [id number]  1980/07/09   Date of visit: 08/27/20  Diagnosis- stage IV metastatic breast cancer ER/PR negative HER-2/neu positive with bone metastases  Chief complaint/ Reason for visit-on treatment assessment prior to cycle 17-day 1 of Taxol Herceptin and Perjeta  Heme/Onc history: patient is a 40 year old Hispanic female who is here with her friend.  History obtained with the help of an interpreter.Patient self palpated left breast mass which was followed by a diagnostic bilateral mammogram.  Mammogram showed 3.1 x 2.9 x 1.9 cm hypoechoic mass at the 1 o'clock position of the left breast.  For abnormal cortically thickened left axillary lymph nodes measuring up to 5 mm.  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  Patient was also having ongoing back pain and was seen by [facility] orthopedics Dr. Doyle Askew who ordered MRI lumbar spine without contrast which showed possible pathologic fractures of L1 and L4 vertebral bodies with greater than 50% height loss at L1 and abnormal signal involving L2-L3 S1 as well as right iliac bone concerning for metastatic disease.  Patient is a single mother of 3 adult children and is very anxious today.  She reports significant back pain which radiates to her bilateral thighs.  Denies any focal tingling numbness or weakness.  Denies any bowel bladder incontinence.  Pain has been uncontrolled despite taking Tylenol.  No prior history of abnormal breast biopsies.  No family history of  breast cancer   PET and MRI showed 3 areas of pathologic fracture of her spine as well as widespread bony metastatic disease and concern for impending fracture of the right hip.  Given her worsening pain she was asked to come to the ER.  She has been evaluated by Dr. Rudene Christians from orthopedic surgery and underwent kyphoplasty at 3 different levels. T6 L1 and L4 along with radiofrequency ablation.    She also underwent prophylactic fixation of the right hip and not affected the sacral region.     Patient received first dose of Herceptin and Perjeta on 09/04/2019.  Baseline echocardiogram normal.she is currently getting taxol/ herceptin/perjeta   Patient admitted to hospital for acute abdominal pain with CT findings concerning for acute colitis.  Colonoscopy showed diffuse severe inflammation with erythema friability and loss of vascularity and shallow ulcerations in the IC valve, ascending colon and cecum.  Interval history-reports feeling well today.  Still has a lingering cough from COVID-19 but denies any additional symptoms.  Has chronic right hip and leg pain that is stable.  New onset right lower back cramping.  This is intermittent.   ECOG PS- 1 Pain scale- 0 Opioid associated constipation- no  Review of systems- Review of Systems  HENT:  Negative for congestion and sore throat.   Respiratory:  Positive for cough.   Musculoskeletal:  Positive for joint pain and myalgias.      No Known Allergies   Past Medical History:  Diagnosis Date   Anxiety    Breast cancer ([place])    with mets   Cancer ([place])  Colitis    COVID-19 in immunocompromised patient [facility])    Family history of colon cancer    Vertigo      Past Surgical History:  Procedure Laterality Date   BREAST BIOPSY Left 08/14/2019   [place] bx of mass, coil marker, path pending   BREAST BIOPSY Left 08/14/2019   [place] bx of LN, hydromarker, path pending   BREAST BIOPSY Left 08/14/2019   affirm bx of calcs, x marker, path pending    ESOPHAGOGASTRODUODENOSCOPY (EGD) WITH PROPOFOL N/A 10/05/2019   Procedure: ESOPHAGOGASTRODUODENOSCOPY (EGD) WITH PROPOFOL;  Surgeon: Lin Landsman, MD;  Location: [place];  Service: Gastroenterology;  Laterality: N/A;   FLEXIBLE SIGMOIDOSCOPY N/A 10/05/2019   Procedure: FLEXIBLE SIGMOIDOSCOPY;  Surgeon: Lin Landsman, MD;  Location: [facility] ENDOSCOPY;  Service: Gastroenterology;  Laterality: N/A;   INTRAMEDULLARY (IM) NAIL INTERTROCHANTERIC Right 09/01/2019   Procedure: INTRAMEDULLARY (IM) NAIL INTERTROCHANTRIC AND RADIOFREQUENCY ABLATION;  Surgeon: Hessie Knows, MD;  Location: [facility] ORS;  Service: Orthopedics;  Laterality: Right;   KYPHOPLASTY N/A 08/29/2019   Procedure: KYPHOPLASTY T6, L1,L4 ,  RADIOFREQUENCY ABLATION;  Surgeon: Hessie Knows, MD;  Location: [facility] ORS;  Service: Orthopedics;  Laterality: N/A;   KYPHOPLASTY Right 09/01/2019   Procedure: Right Sacral Radiofrequency Ablation and Cement Augmentation, Right sacrum and iliac crest;  Surgeon: Hessie Knows, MD;  Location: [facility] ORS;  Service: Orthopedics;  Laterality: Right;   PORTA CATH INSERTION N/A 08/28/2019   Procedure: PORTA CATH INSERTION;  Surgeon: Algernon Huxley, MD;  Location: [place] CV LAB;  Service: Cardiovascular;  Laterality: N/A;    Social History   Socioeconomic History   Marital status: Single    Spouse name: Not on file   Number of children: Not on file   Years of education: Not on file   Highest education level: Not on file  Occupational History   Not on file  Tobacco Use   Smoking status: Never   Smokeless tobacco: Never  Vaping Use   Vaping Use: Never used  Substance and Sexual Activity   Alcohol use: Not Currently   Drug use: Not Currently   Sexual activity: Not Currently    Birth control/protection: None  Other Topics Concern   Not on file  Social History Narrative   Lives at home with children   Social Determinants of Health   Financial Resource Strain: Not on file  Food  Insecurity: Not on file  Transportation Needs: Not on file  Physical Activity: Not on file  Stress: Not on file  Social Connections: Not on file  Intimate Partner Violence: Not on file    Family History  Problem Relation Age of Onset   Colon cancer Maternal Uncle      Current Outpatient Medications:    albuterol (VENTOLIN HFA) 108 (90 Base) MCG/ACT inhaler, Inhale 2 puffs into the lungs every 6 (six) hours as needed for wheezing or shortness of breath., Disp: 8 g, Rfl: 2   chlorpheniramine-HYDROcodone (TUSSIONEX) 10-8 MG/5ML SUER, Take 5 mLs by mouth every 12 (twelve) hours as needed for cough., Disp: 140 mL, Rfl: 0   clindamycin (CLINDAGEL) 1 % gel, Apply topically 2 (two) times daily. X 7 days., Disp: 30 g, Rfl: 0   diphenoxylate-atropine (LOMOTIL) 2.5-0.025 MG tablet, Take 1 tablet by mouth 4 (four) times daily as needed for diarrhea or loose stools., Disp: 40 tablet, Rfl: 0   ergocalciferol (VITAMIN D2) 1.25 MG (50000 UT) capsule, Take 1 capsule (50,000 Units total) by mouth once a week. For 4  weeks. Then 1 capsule once monthly for 4 months, Disp: 8 capsule, Rfl: 0   FLUoxetine (PROZAC) 10 MG capsule, Take 1 capsule (10 mg total) by mouth daily., Disp: 30 capsule, Rfl: 3   gabapentin (NEURONTIN) 600 MG tablet, Take 1 tablet (600 mg total) by mouth 3 (three) times daily., Disp: 90 tablet, Rfl: 2   lidocaine-prilocaine (EMLA) cream, Apply 1 application topically as needed. Apply small amount to port site at least 1 hour prior to it being accessed, cover with plastic wrap, Disp: 30 g, Rfl: 1   ondansetron (ZOFRAN) 8 MG tablet, Take 1 tablet (8 mg total) by mouth every 8 (eight) hours as needed for nausea or vomiting., Disp: 45 tablet, Rfl: 2   oxycodone (OXY-IR) 5 MG capsule, Take 1-2 capsules (5-10 mg total) by mouth every 4 (four) hours as needed., Disp: 90 capsule, Rfl: 0   oxyCODONE (OXYCONTIN) 10 mg 12 hr tablet, Take 1 tablet (10 mg total) by mouth every 12 (twelve) hours., Disp: 60  tablet, Rfl: 0   prochlorperazine (COMPAZINE) 10 MG tablet, Take 1 tablet (10 mg total) by mouth every 6 (six) hours as needed for nausea or vomiting., Disp: 45 tablet, Rfl: 2   traZODone (DESYREL) 50 MG tablet, Take 1-2 tablets (50-100 mg total) by mouth at bedtime as needed for sleep., Disp: 60 tablet, Rfl: 2   potassium chloride SA (KLOR-CON) 20 MEQ tablet, Take by mouth. (Patient not taking: No sig reported), Disp: , Rfl:  No current facility-administered medications for this visit.  Facility-Administered Medications Ordered in Other Visits:    0.9 %  sodium chloride infusion, , Intravenous, Continuous, Miraya Cudney, Wandra Feinstein, NP, Stopped at 06/02/20 1453   heparin lock flush 100 unit/mL, 500 Units, Intracatheter, Once PRN, Sindy Guadeloupe, MD   PACLitaxel (TAXOL) 108 mg in sodium chloride 0.9 % 250 mL chemo infusion (</= $RemoveBefor'80mg'gPvkxFiqKIjF$ /m2), 65 mg/m2 (Treatment Plan Recorded), Intravenous, Once, Sindy Guadeloupe, MD, Last Rate: 268 mL/hr at 08/27/20 1406, 108 mg at 08/27/20 1406   sodium chloride flush (NS) 0.9 % injection 10 mL, 10 mL, Intracatheter, PRN, Sindy Guadeloupe, MD  Physical exam:  Physical Exam Constitutional:      General: She is not in acute distress. Cardiovascular:     Rate and Rhythm: Normal rate and regular rhythm.     Heart sounds: Normal heart sounds.  Pulmonary:     Effort: Pulmonary effort is normal.  Skin:    General: Skin is warm and dry.  Neurological:     Mental Status: She is alert and oriented to person, place, and time.     CMP Latest Ref Rng & Units 08/27/2020  Glucose 70 - 99 mg/dL 135(H)  BUN 6 - 20 mg/dL 14  Creatinine 0.44 - 1.00 mg/dL 0.55  Sodium 135 - 145 mmol/L 136  Potassium 3.5 - 5.1 mmol/L 3.3(L)  Chloride 98 - 111 mmol/L 105  CO2 22 - 32 mmol/L 21(L)  Calcium 8.9 - 10.3 mg/dL 8.5(L)  Total Protein 6.5 - 8.1 g/dL 7.3  Total Bilirubin 0.3 - 1.2 mg/dL 0.5  Alkaline Phos 38 - 126 U/L 84  AST 15 - 41 U/L 36  ALT 0 - 44 U/L 25   CBC Latest Ref Rng & Units  08/27/2020  WBC 4.0 - 10.5 K/uL 4.5  Hemoglobin 12.0 - 15.0 g/dL 10.7(L)  Hematocrit 36.0 - 46.0 % 33.7(L)  Platelets 150 - 400 K/uL 366      Assessment and plan- Patient is a 40  y.o. female with metastatic ER/PR negative HER-2 positive breast cancer with bone metastases.    She is here for on treatment assessment prior to cycle 17-day 1 of Taxol Herceptin and Perjeta.  She was recently diagnosed with COVID and treated with oral antivirals.  Treatment was held during that time.  She is recovered well with only a lingering cough.  Proceed with Taxol Herceptin and Perjeta plus Xgeva.  Labs acceptable for treatment.  Mild hypokalemia-potassium 3.3 today.  Patient states she is intermittently on p.o. potassium tablets.  Recommend she restart 20 mill equivalent tablets daily x1 week.  She will return to clinic as scheduled next week for cycle 17-day 8 and on 09/17/2020 prior to cycle 18-day 1.  Hypocalcemia-calcium 8.5 today.  Secondary to Xgeva.  Monitor. Continue calcium supplements.   Neoplasm related pain: Continue OxyContin and as needed oxycodone.  Greater than 50% was spent in counseling and coordination of care with this patient including but not limited to discussion of the relevant topics above (See A&P) including, but not limited to diagnosis and management of acute and chronic medical conditions.    Visit Diagnosis 1. Metastatic breast cancer ([place])   2. Neoplasm related pain   3. Hypokalemia     Faythe Casa, NP 08/27/2020 2:07 PM

## 2020-08-27 NOTE — Progress Notes (Signed)
Patient here today for follow up regarding breast cancer, treatment consideration. Patient reports new onset of cramping to right side, improved with oxycodone.

## 2020-08-28 LAB — CANCER ANTIGEN 27.29: CA 27.29: 9.8 U/mL (ref 0.0–38.6)

## 2020-09-02 ENCOUNTER — Telehealth: Payer: Self-pay | Admitting: *Deleted

## 2020-09-02 DIAGNOSIS — R252 Cramp and spasm: Secondary | ICD-10-CM

## 2020-09-02 DIAGNOSIS — C50012 Malignant neoplasm of nipple and areola, left female breast: Secondary | ICD-10-CM

## 2020-09-02 DIAGNOSIS — Z5111 Encounter for antineoplastic chemotherapy: Secondary | ICD-10-CM

## 2020-09-02 NOTE — Telephone Encounter (Signed)
Called pt and got her voicemail. Moved the lab to +9:45 and then added [name] for [facility] at 10:15. the patient did not answer and I left her amessage about coming in 9:45 to be seen.

## 2020-09-02 NOTE — Telephone Encounter (Signed)
I have no room. One of you will need to see her
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
## 2020-09-03 ENCOUNTER — Inpatient Hospital Stay (HOSPITAL_BASED_OUTPATIENT_CLINIC_OR_DEPARTMENT_OTHER): Payer: Self-pay | Admitting: Hospice and Palliative Medicine

## 2020-09-03 ENCOUNTER — Inpatient Hospital Stay: Payer: Self-pay

## 2020-09-03 VITALS — BP 113/73 | HR 99 | Temp 97.8°F | Resp 16 | Wt 131.5 lb

## 2020-09-03 DIAGNOSIS — C50012 Malignant neoplasm of nipple and areola, left female breast: Secondary | ICD-10-CM

## 2020-09-03 DIAGNOSIS — R252 Cramp and spasm: Secondary | ICD-10-CM

## 2020-09-03 DIAGNOSIS — M8440XA Pathological fracture, unspecified site, initial encounter for fracture: Secondary | ICD-10-CM

## 2020-09-03 DIAGNOSIS — R519 Headache, unspecified: Secondary | ICD-10-CM

## 2020-09-03 DIAGNOSIS — C50919 Malignant neoplasm of unspecified site of unspecified female breast: Secondary | ICD-10-CM

## 2020-09-03 DIAGNOSIS — Z5111 Encounter for antineoplastic chemotherapy: Secondary | ICD-10-CM

## 2020-09-03 LAB — CBC WITH DIFFERENTIAL/PLATELET
Abs Immature Granulocytes: 0.03 10*3/uL (ref 0.00–0.07)
Basophils Absolute: 0 10*3/uL (ref 0.0–0.1)
Basophils Relative: 1 %
Eosinophils Absolute: 0.3 10*3/uL (ref 0.0–0.5)
Eosinophils Relative: 5 %
HCT: 33.8 % — ABNORMAL LOW (ref 36.0–46.0)
Hemoglobin: 10.9 g/dL — ABNORMAL LOW (ref 12.0–15.0)
Immature Granulocytes: 1 %
Lymphocytes Relative: 27 %
Lymphs Abs: 1.4 10*3/uL (ref 0.7–4.0)
MCH: 25.5 pg — ABNORMAL LOW (ref 26.0–34.0)
MCHC: 32.2 g/dL (ref 30.0–36.0)
MCV: 79 fL — ABNORMAL LOW (ref 80.0–100.0)
Monocytes Absolute: 0.3 10*3/uL (ref 0.1–1.0)
Monocytes Relative: 5 %
Neutro Abs: 3.2 10*3/uL (ref 1.7–7.7)
Neutrophils Relative %: 61 %
Platelets: 415 10*3/uL — ABNORMAL HIGH (ref 150–400)
RBC: 4.28 MIL/uL (ref 3.87–5.11)
RDW: 18.8 % — ABNORMAL HIGH (ref 11.5–15.5)
WBC: 5.2 10*3/uL (ref 4.0–10.5)
nRBC: 0 % (ref 0.0–0.2)

## 2020-09-03 LAB — COMPREHENSIVE METABOLIC PANEL
ALT: 15 U/L (ref 0–44)
AST: 26 U/L (ref 15–41)
Albumin: 3.5 g/dL (ref 3.5–5.0)
Alkaline Phosphatase: 69 U/L (ref 38–126)
Anion gap: 9 (ref 5–15)
BUN: 11 mg/dL (ref 6–20)
CO2: 24 mmol/L (ref 22–32)
Calcium: 8.9 mg/dL (ref 8.9–10.3)
Chloride: 101 mmol/L (ref 98–111)
Creatinine, Ser: 0.48 mg/dL (ref 0.44–1.00)
GFR, Estimated: >60 mL/min (ref >60)
Glucose, Bld: 119 mg/dL — ABNORMAL HIGH (ref 70–99)
Potassium: 3.5 mmol/L (ref 3.5–5.1)
Sodium: 134 mmol/L — ABNORMAL LOW (ref 135–145)
Total Bilirubin: 0.4 mg/dL (ref 0.3–1.2)
Total Protein: 7.2 g/dL (ref 6.5–8.1)

## 2020-09-03 LAB — MAGNESIUM: Magnesium: 2 mg/dL (ref 1.7–2.4)

## 2020-09-03 MED ORDER — SODIUM CHLORIDE 0.9 % IV SOLN
65.0000 mg/m2 | Freq: Once | INTRAVENOUS | Status: AC
  Administered 2020-09-03: 108 mg via INTRAVENOUS
  Filled 2020-09-03: qty 18

## 2020-09-03 MED ORDER — HEPARIN SOD (PORK) LOCK FLUSH 100 UNIT/ML IV SOLN
500.0000 [IU] | Freq: Once | INTRAVENOUS | Status: AC
  Administered 2020-09-03: 500 [IU] via INTRAVENOUS
  Filled 2020-09-03: qty 5

## 2020-09-03 MED ORDER — ACETAMINOPHEN 325 MG PO TABS
650.0000 mg | ORAL_TABLET | Freq: Four times a day (QID) | ORAL | Status: DC | PRN
  Administered 2020-09-03: 650 mg via ORAL
  Filled 2020-09-03: qty 2

## 2020-09-03 MED ORDER — DIPHENHYDRAMINE HCL 50 MG/ML IJ SOLN
50.0000 mg | Freq: Once | INTRAMUSCULAR | Status: AC
  Administered 2020-09-03: 50 mg via INTRAVENOUS
  Filled 2020-09-03: qty 1

## 2020-09-03 MED ORDER — FAMOTIDINE 20 MG IN NS 100 ML IVPB
20.0000 mg | Freq: Once | INTRAVENOUS | Status: AC
  Administered 2020-09-03: 20 mg via INTRAVENOUS
  Filled 2020-09-03: qty 20

## 2020-09-03 MED ORDER — HEPARIN SOD (PORK) LOCK FLUSH 100 UNIT/ML IV SOLN
INTRAVENOUS | Status: AC
  Filled 2020-09-03: qty 5

## 2020-09-03 MED ORDER — SODIUM CHLORIDE 0.9 % IV SOLN
Freq: Once | INTRAVENOUS | Status: AC
  Filled 2020-09-03: qty 250

## 2020-09-03 MED ORDER — SODIUM CHLORIDE 0.9% FLUSH
10.0000 mL | INTRAVENOUS | Status: DC | PRN
  Administered 2020-09-03: 10 mL via INTRAVENOUS
  Filled 2020-09-03: qty 10

## 2020-09-03 MED ORDER — SODIUM CHLORIDE 0.9 % IV SOLN
20.0000 mg | Freq: Once | INTRAVENOUS | Status: AC
  Administered 2020-09-03: 20 mg via INTRAVENOUS
  Filled 2020-09-03: qty 20

## 2020-09-03 MED ORDER — CYCLOBENZAPRINE HCL 5 MG PO TABS: 5.0000 mg | ORAL_TABLET | Freq: Three times a day (TID) | ORAL | 0 refills | Status: DC | PRN

## 2020-09-03 NOTE — Progress Notes (Signed)
[place]  Telephone:([phone] [phone]   Name: Sophia [name] Date: 09/03/2020 MRN: [id number]  DOB: 1980-11-03  Patient Care Team: [place], [place], NP as PCP - General Rico Junker, RN as Registered Nurse Theodore Demark, RN as Registered Nurse Sindy Guadeloupe, MD as Consulting Physician (Hematology and Oncology)    REASON FOR CONSULTATION: Sophia [name] is a 40 y.o. 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  Patient has history of pathologic fracture to spine status post kyphoplasty.  She also underwent prophylactic fixation of the right hip due to impending fracture.  She has had significant depression and anxiety.  She was referred to palliative care to help address goals and manage ongoing symptoms.  SOCIAL HISTORY:     reports that she has never smoked. She has never used smokeless tobacco. She reports previous alcohol use. She reports previous drug use.  Patient is from [place].  She speaks [place].  She lives here with her 3 children ages 28-15.  She has no family or friend/social support locally.  She is unable to work.  ADVANCE DIRECTIVES:  Does not have  CODE STATUS:   PAST MEDICAL HISTORY: Past Medical History:  Diagnosis Date   Anxiety    Breast cancer ([place])    with mets   Cancer ([place])    Colitis    COVID-19 in immunocompromised patient ([place])    Family history of colon cancer    Vertigo     PAST SURGICAL HISTORY:  Past Surgical History:  Procedure Laterality Date   BREAST BIOPSY Left 08/14/2019   [place] bx of mass, coil marker, path pending   BREAST BIOPSY Left 08/14/2019   [place] bx of LN, hydromarker, path pending   BREAST BIOPSY Left 08/14/2019   affirm bx of calcs, x marker, path pending   ESOPHAGOGASTRODUODENOSCOPY (EGD) WITH PROPOFOL N/A 10/05/2019   Procedure:  ESOPHAGOGASTRODUODENOSCOPY (EGD) WITH PROPOFOL;  Surgeon: Lin Landsman, MD;  Location: [place];  Service: Gastroenterology;  Laterality: N/A;   FLEXIBLE SIGMOIDOSCOPY N/A 10/05/2019   Procedure: FLEXIBLE SIGMOIDOSCOPY;  Surgeon: Lin Landsman, MD;  Location: [facility] ENDOSCOPY;  Service: Gastroenterology;  Laterality: N/A;   INTRAMEDULLARY (IM) NAIL INTERTROCHANTERIC Right 09/01/2019   Procedure: INTRAMEDULLARY (IM) NAIL INTERTROCHANTRIC AND RADIOFREQUENCY ABLATION;  Surgeon: Hessie Knows, MD;  Location: [facility] ORS;  Service: Orthopedics;  Laterality: Right;   KYPHOPLASTY N/A 08/29/2019   Procedure: KYPHOPLASTY T6, L1,L4 ,  RADIOFREQUENCY ABLATION;  Surgeon: Hessie Knows, MD;  Location: [facility] ORS;  Service: Orthopedics;  Laterality: N/A;   KYPHOPLASTY Right 09/01/2019   Procedure: Right Sacral Radiofrequency Ablation and Cement Augmentation, Right sacrum and iliac crest;  Surgeon: Hessie Knows, MD;  Location: [facility] ORS;  Service: Orthopedics;  Laterality: Right;   PORTA CATH INSERTION N/A 08/28/2019   Procedure: PORTA CATH INSERTION;  Surgeon: Algernon Huxley, MD;  Location: [place] CV LAB;  Service: Cardiovascular;  Laterality: N/A;    HEMATOLOGY/ONCOLOGY HISTORY:  Oncology History  Metastatic breast cancer ([place])  08/29/2019 Initial Diagnosis   Metastatic breast cancer ([place])   09/04/2019 -  Chemotherapy    Patient is on Treatment Plan: BREAST WEEKLY PACLITAXEL + TRASTUZUMAB + PERTUZUMAB Q21D       Genetic Testing   Negative genetic testing. No pathogenic variants identified on the [facility] CancerNext-Expanded+RNA panel. The report date is 04/13/2020.   The CancerNext-Expanded + RNAinsight gene panel offered  by Karna Dupes and includes sequencing and rearrangement analysis for the following 77 genes: IP, ALK, APC*, ATM*, AXIN2, BAP1, BARD1, BLM, BMPR1A, BRCA1*, BRCA2*, BRIP1*, CDC73, CDH1*,CDK4, CDKN1B, CDKN2A, CHEK2*, CTNNA1, DICER1, FANCC, FH, FLCN, GALNT12, KIF1B, LZTR1, MAX,  MEN1, MET, MLH1*, MSH2*, MSH3, MSH6*, MUTYH*, NBN, NF1*, NF2, NTHL1, PALB2*, PHOX2B, PMS2*, POT1, PRKAR1A, PTCH1, PTEN*, RAD51C*, RAD51D*,RB1, RECQL, RET, SDHA, SDHAF2, SDHB, SDHC, SDHD, SMAD4, SMARCA4, SMARCB1, SMARCE1, STK11, SUFU, TMEM127, TP53*,TSC1, TSC2, VHL and XRCC2 (sequencing and deletion/duplication); EGFR, EGLN1, HOXB13, KIT, MITF, PDGFRA, POLD1 and POLE (sequencing only); EPCAM and GREM1 (deletion/duplication only). DNA and RNA analyses performed for * genes.     ALLERGIES:  has No Known Allergies.  MEDICATIONS:  Current Outpatient Medications  Medication Sig Dispense Refill   albuterol (VENTOLIN HFA) 108 (90 Base) MCG/ACT inhaler Inhale 2 puffs into the lungs every 6 (six) hours as needed for wheezing or shortness of breath. 8 g 2   clindamycin (CLINDAGEL) 1 % gel Apply topically 2 (two) times daily. X 7 days. 30 g 0   diphenoxylate-atropine (LOMOTIL) 2.5-0.025 MG tablet Take 1 tablet by mouth 4 (four) times daily as needed for diarrhea or loose stools. 40 tablet 0   ergocalciferol (VITAMIN D2) 1.25 MG (50000 UT) capsule Take 1 capsule (50,000 Units total) by mouth once a week. For 4 weeks. Then 1 capsule once monthly for 4 months 8 capsule 0   FLUoxetine (PROZAC) 10 MG capsule Take 1 capsule (10 mg total) by mouth daily. 30 capsule 3   gabapentin (NEURONTIN) 600 MG tablet Take 1 tablet (600 mg total) by mouth 3 (three) times daily. 90 tablet 2   lidocaine-prilocaine (EMLA) cream Apply 1 application topically as needed. Apply small amount to port site at least 1 hour prior to it being accessed, cover with plastic wrap 30 g 1   ondansetron (ZOFRAN) 8 MG tablet Take 1 tablet (8 mg total) by mouth every 8 (eight) hours as needed for nausea or vomiting. 45 tablet 2   oxycodone (OXY-IR) 5 MG capsule Take 1-2 capsules (5-10 mg total) by mouth every 4 (four) hours as needed. 90 capsule 0   oxyCODONE (OXYCONTIN) 10 mg 12 hr tablet Take 1 tablet (10 mg total) by mouth every 12 (twelve) hours.  60 tablet 0   potassium chloride SA (KLOR-CON) 20 MEQ tablet Take by mouth. (Patient not taking: No sig reported)     prochlorperazine (COMPAZINE) 10 MG tablet Take 1 tablet (10 mg total) by mouth every 6 (six) hours as needed for nausea or vomiting. (Patient not taking: Reported on 09/03/2020) 45 tablet 2   No current facility-administered medications for this visit.   Facility-Administered Medications Ordered in Other Visits  Medication Dose Route Frequency Provider Last Rate Last Admin   0.9 %  sodium chloride infusion   Intravenous Continuous Mauro Kaufmann, NP   Stopped at 06/02/20 1453   heparin lock flush 100 unit/mL  500 Units Intravenous Once Malayasia Mirkin, Ivin Booty R, NP       sodium chloride flush (NS) 0.9 % injection 10 mL  10 mL Intravenous PRN Baylea Milburn, Daryl Eastern, NP   10 mL at 09/03/20 1057    VITAL SIGNS: There were no vitals taken for this visit. There were no vitals filed for this visit.  Estimated body mass index is 25.8 kg/m as calculated from the following:   Height as of 07/02/20: 5' (1.524 m).   Weight as of 08/27/20: 132 lb 1.6 oz (59.9 kg).  LABS: CBC:  Component Value Date/Time   WBC 5.2 09/03/2020 1057   HGB 10.9 (L) 09/03/2020 1057   HCT 33.8 (L) 09/03/2020 1057   PLT 415 (H) 09/03/2020 1057   MCV 79.0 (L) 09/03/2020 1057   NEUTROABS 3.2 09/03/2020 1057   LYMPHSABS 1.4 09/03/2020 1057   MONOABS 0.3 09/03/2020 1057   EOSABS 0.3 09/03/2020 1057   BASOSABS 0.0 09/03/2020 1057   Comprehensive Metabolic Panel:    Component Value Date/Time   NA 134 (L) 09/03/2020 1057   K 3.5 09/03/2020 1057   CL 101 09/03/2020 1057   CO2 24 09/03/2020 1057   BUN 11 09/03/2020 1057   CREATININE 0.48 09/03/2020 1057   GLUCOSE 119 (H) 09/03/2020 1057   CALCIUM 8.9 09/03/2020 1057   AST 26 09/03/2020 1057   ALT 15 09/03/2020 1057   ALKPHOS 69 09/03/2020 1057   BILITOT 0.4 09/03/2020 1057   PROT 7.2 09/03/2020 1057   ALBUMIN 3.5 09/03/2020 1057    RADIOGRAPHIC  STUDIES: No results found.  PERFORMANCE STATUS (ECOG) : 1 - Symptomatic but completely ambulatory  Review of Systems Unless otherwise noted, a complete review of systems is negative.  Physical Exam General: NAD Cardiac: RRR Pulmonary: Clear to auscultation anterior/posterior fields Abdomen: Soft, nontender to palp, bowel sounds present Extremities: no edema, no joint deformities Skin: no rashes Neurological: Grossly nonfocal  IMPRESSION: Patient was an acute add-on to my clinic schedule today for evaluation and management muscle spasms/cramps.  Patient reports that she has been having muscle cramping/spasms in her right thigh over the past several days.  It is happened several times, primarily at night.  She says it lasts about 2 minutes and then the muscle will relax.  She denies trauma.  No edema.  No numbness or tingling.  Labs were checked and actually unchanged from baseline.  Magnesium normal.  Discussed conservative measures including stretching and use of heat compresses.  Will refer to start stretching exercises.  Recommended B complex vitamin daily.  She is on gabapentin.  Will prescribe Flexeril for use as needed.  Patient denies any other GI symptoms or events.  Otherwise, she feels she is doing reasonably well.  PLAN: -Continue current scope of treatment -Continue Prozac 10 mg daily -Continue OxyContin, oxycodone -Flexeril 5 mg 3 times daily as needed for muscle spasms -Recommend B complex vitamin daily -Referral for rehab screening for stretching exercises -Follow-up MyChart visit in 1-2 months  Case and plan discussed with Dr. Janese Banks  Patient expressed understanding and was in agreement with this plan. She also understands that She can call the clinic at any time with any questions, concerns, or complaints.     Time Total: 15 minutes  Visit consisted of counseling and education dealing with the complex and emotionally intense issues of symptom management and  palliative care in the setting of serious and potentially life-threatening illness.Greater than 50%  of this time was spent counseling and coordinating care related to the above assessment and plan.  Signed by: Altha Harm, PhD, NP-C

## 2020-09-03 NOTE — Patient Instructions (Signed)
C[facility]ER CENTER [facility]  Discharge Instructions: Thank you for choosing [facility] to provide your oncology and hematology care.  If you have a lab appointment with the Cancer Center, please go directly to the Cancer Center and check in at the registration area.  Wear comfortable clothing and clothing appropriate for easy access to any Portacath or PICC line.   We strive to give you quality time with your provider. You may need to reschedule your appointment if you arrive late (15 or more minutes).  Arriving late affects you and other patients whose appointments are after yours.  Also, if you miss three or more appointments without notifying the office, you may be dismissed from the clinic at the provider's discretion.      For prescription refill requests, have your pharmacy contact our office and allow 72 hours for refills to be completed.    Today you received the following chemotherapy and/or immunotherapy agents Taxol        To help prevent nausea and vomiting after your treatment, we encourage you to take your nausea medication as directed.  BELOW ARE SYMPTOMS THAT SHOULD BE REPORTED IMMEDIATELY: *FEVER GREATER THAN 100.4 F (38 C) OR HIGHER *CHILLS OR SWEATING *NAUSEA AND VOMITING THAT IS NOT CONTROLLED WITH YOUR NAUSEA MEDICATION *UNUSUAL SHORTNESS OF BREATH *UNUSUAL BRUISING OR BLEEDING *URINARY PROBLEMS (pain or burning when urinating, or frequent urination) *BOWEL PROBLEMS (unusual diarrhea, constipation, pain near the anus) TENDERNESS IN MOUTH AND THROAT WITH OR WITHOUT PRESENCE OF ULCERS (sore throat, sores in mouth, or a toothache) UNUSUAL RASH, SWELLING OR PAIN  UNUSUAL VAGINAL DISCHARGE OR ITCHING   Items with * indicate a potential emergency and should be followed up as soon as possible or go to the Emergency Department if any problems should occur.  Please show the CHEMOTHERAPY ALERT CARD or IMMUNOTHERAPY ALERT CARD at check-in to  the Emergency Department and triage nurse.  Should you have questions after your visit or need to cancel or reschedule your appointment, please contact [facility]CER CENTER  [facility]  [phone] and follow the prompts.  Office hours are 8:00 a.m. to 4:30 p.m. Monday - Friday. Please note that voicemails left after 4:00 p.m. may not be returned until the following business day.  We are closed weekends and major holidays. You have access to a nurse at all times for urgent questions. Please call the main number to the clinic [phone] and follow the prompts.  For any non-urgent questions, you may also contact your provider using MyChart. We now offer e-Visits for anyone 18 and older to request care online for non-urgent symptoms. For details visit [email or web address].   Also download the MyChart app! Go to the app store, search "MyChart", open the app, select [facility], and log in with your MyChart username and password.  Due to Covid, a mask is required upon entering the hospital/clinic. If you do not have a mask, one will be given to you upon arrival. For doctor visits, patients may have 1 support person aged 18 or older with them. For treatment visits, patients cannot have anyone with them due to current Covid guidelines and our immunocompromised population.  

## 2020-09-03 NOTE — Progress Notes (Signed)
Per NP, [name] Borders, order: okay to proceed with scheduled Taxol treatment at this time.

## 2020-09-17 ENCOUNTER — Inpatient Hospital Stay: Payer: Self-pay

## 2020-09-17 ENCOUNTER — Inpatient Hospital Stay: Payer: Self-pay | Attending: Hospice and Palliative Medicine

## 2020-09-17 ENCOUNTER — Inpatient Hospital Stay (HOSPITAL_BASED_OUTPATIENT_CLINIC_OR_DEPARTMENT_OTHER): Payer: Self-pay | Admitting: Oncology

## 2020-09-17 VITALS — BP 116/57 | HR 84 | Temp 97.0°F | Resp 20 | Wt 133.7 lb

## 2020-09-17 DIAGNOSIS — C50919 Malignant neoplasm of unspecified site of unspecified female breast: Secondary | ICD-10-CM

## 2020-09-17 DIAGNOSIS — R300 Dysuria: Secondary | ICD-10-CM

## 2020-09-17 DIAGNOSIS — C50912 Malignant neoplasm of unspecified site of left female breast: Secondary | ICD-10-CM

## 2020-09-17 DIAGNOSIS — G893 Neoplasm related pain (acute) (chronic): Secondary | ICD-10-CM | POA: Insufficient documentation

## 2020-09-17 DIAGNOSIS — Z79899 Other long term (current) drug therapy: Secondary | ICD-10-CM | POA: Insufficient documentation

## 2020-09-17 DIAGNOSIS — Z5111 Encounter for antineoplastic chemotherapy: Secondary | ICD-10-CM | POA: Insufficient documentation

## 2020-09-17 DIAGNOSIS — C7951 Secondary malignant neoplasm of bone: Secondary | ICD-10-CM

## 2020-09-17 DIAGNOSIS — M8440XA Pathological fracture, unspecified site, initial encounter for fracture: Secondary | ICD-10-CM

## 2020-09-17 DIAGNOSIS — Z5112 Encounter for antineoplastic immunotherapy: Secondary | ICD-10-CM | POA: Insufficient documentation

## 2020-09-17 DIAGNOSIS — R309 Painful micturition, unspecified: Secondary | ICD-10-CM | POA: Insufficient documentation

## 2020-09-17 DIAGNOSIS — Z171 Estrogen receptor negative status [ER-]: Secondary | ICD-10-CM | POA: Insufficient documentation

## 2020-09-17 DIAGNOSIS — C50412 Malignant neoplasm of upper-outer quadrant of left female breast: Secondary | ICD-10-CM | POA: Insufficient documentation

## 2020-09-17 LAB — CBC WITH DIFFERENTIAL/PLATELET
Abs Immature Granulocytes: 0.04 10*3/uL (ref 0.00–0.07)
Basophils Absolute: 0 10*3/uL (ref 0.0–0.1)
Basophils Relative: 0 %
Eosinophils Absolute: 0.1 10*3/uL (ref 0.0–0.5)
Eosinophils Relative: 2 %
HCT: 34.8 % — ABNORMAL LOW (ref 36.0–46.0)
Hemoglobin: 10.8 g/dL — ABNORMAL LOW (ref 12.0–15.0)
Immature Granulocytes: 1 %
Lymphocytes Relative: 23 %
Lymphs Abs: 1.5 10*3/uL (ref 0.7–4.0)
MCH: 24.4 pg — ABNORMAL LOW (ref 26.0–34.0)
MCHC: 31 g/dL (ref 30.0–36.0)
MCV: 78.6 fL — ABNORMAL LOW (ref 80.0–100.0)
Monocytes Absolute: 0.5 10*3/uL (ref 0.1–1.0)
Monocytes Relative: 7 %
Neutro Abs: 4.6 10*3/uL (ref 1.7–7.7)
Neutrophils Relative %: 67 %
Platelets: 439 10*3/uL — ABNORMAL HIGH (ref 150–400)
RBC: 4.43 MIL/uL (ref 3.87–5.11)
RDW: 18.8 % — ABNORMAL HIGH (ref 11.5–15.5)
WBC: 6.8 10*3/uL (ref 4.0–10.5)
nRBC: 0 % (ref 0.0–0.2)

## 2020-09-17 LAB — COMPREHENSIVE METABOLIC PANEL WITH GFR
ALT: 31 U/L (ref 0–44)
AST: 34 U/L (ref 15–41)
Albumin: 3.6 g/dL (ref 3.5–5.0)
Alkaline Phosphatase: 85 U/L (ref 38–126)
Anion gap: 9 (ref 5–15)
BUN: 10 mg/dL (ref 6–20)
CO2: 23 mmol/L (ref 22–32)
Calcium: 8.6 mg/dL — ABNORMAL LOW (ref 8.9–10.3)
Chloride: 103 mmol/L (ref 98–111)
Creatinine, Ser: 0.56 mg/dL (ref 0.44–1.00)
GFR, Estimated: >60 mL/min
Glucose, Bld: 149 mg/dL — ABNORMAL HIGH (ref 70–99)
Potassium: 3.2 mmol/L — ABNORMAL LOW (ref 3.5–5.1)
Sodium: 135 mmol/L (ref 135–145)
Total Bilirubin: 0.8 mg/dL (ref 0.3–1.2)
Total Protein: 7.5 g/dL (ref 6.5–8.1)

## 2020-09-17 LAB — URINALYSIS, COMPLETE (UACMP) WITH MICROSCOPIC
Bacteria, UA: NONE SEEN
Bilirubin Urine: NEGATIVE
Glucose, UA: NEGATIVE mg/dL
Ketones, ur: NEGATIVE mg/dL
Nitrite: NEGATIVE
Protein, ur: NEGATIVE mg/dL
Specific Gravity, Urine: 1.006 (ref 1.005–1.030)
Squamous Epithelial / HPF: NONE SEEN (ref 0–5)
pH: 5 (ref 5.0–8.0)

## 2020-09-17 MED ORDER — FAMOTIDINE 20 MG IN NS 100 ML IVPB
20.0000 mg | Freq: Once | INTRAVENOUS | Status: AC
Start: 2020-09-17 — End: 2020-09-17
  Administered 2020-09-17: 20 mg via INTRAVENOUS
  Filled 2020-09-17: qty 20

## 2020-09-17 MED ORDER — SODIUM CHLORIDE 0.9 % IV SOLN
65.0000 mg/m2 | Freq: Once | INTRAVENOUS | Status: AC
  Administered 2020-09-17: 108 mg via INTRAVENOUS
  Filled 2020-09-17: qty 18

## 2020-09-17 MED ORDER — SODIUM CHLORIDE 0.9 % IV SOLN
420.0000 mg | Freq: Once | INTRAVENOUS | Status: AC
  Administered 2020-09-17: 420 mg via INTRAVENOUS
  Filled 2020-09-17: qty 14

## 2020-09-17 MED ORDER — SODIUM CHLORIDE 0.9% FLUSH
10.0000 mL | INTRAVENOUS | Status: DC | PRN
  Administered 2020-09-17: 10 mL via INTRAVENOUS
  Filled 2020-09-17: qty 10

## 2020-09-17 MED ORDER — DIPHENHYDRAMINE HCL 50 MG/ML IJ SOLN
50.0000 mg | Freq: Once | INTRAMUSCULAR | Status: AC
Start: 2020-09-17 — End: 2020-09-17
  Administered 2020-09-17: 50 mg via INTRAVENOUS
  Filled 2020-09-17: qty 1

## 2020-09-17 MED ORDER — SODIUM CHLORIDE 0.9 % IV SOLN
Freq: Once | INTRAVENOUS | Status: AC
  Filled 2020-09-17: qty 250

## 2020-09-17 MED ORDER — HEPARIN SOD (PORK) LOCK FLUSH 100 UNIT/ML IV SOLN
INTRAVENOUS | Status: AC
  Filled 2020-09-17: qty 5

## 2020-09-17 MED ORDER — ACETAMINOPHEN 325 MG PO TABS
650.0000 mg | ORAL_TABLET | Freq: Once | ORAL | Status: AC
Start: 2020-09-17 — End: 2020-09-17
  Administered 2020-09-17: 650 mg via ORAL
  Filled 2020-09-17: qty 2

## 2020-09-17 MED ORDER — HEPARIN SOD (PORK) LOCK FLUSH 100 UNIT/ML IV SOLN
500.0000 [IU] | Freq: Once | INTRAVENOUS | Status: AC
  Administered 2020-09-17: 500 [IU] via INTRAVENOUS
  Filled 2020-09-17: qty 5

## 2020-09-17 MED ORDER — POTASSIUM CHLORIDE CRYS ER 20 MEQ PO TBCR: 20.0000 meq | EXTENDED_RELEASE_TABLET | Freq: Every day | ORAL | 0 refills | Status: DC

## 2020-09-17 MED ORDER — SULFAMETHOXAZOLE-TRIMETHOPRIM 800-160 MG PO TABS: 1.0000 | ORAL_TABLET | Freq: Two times a day (BID) | ORAL | 0 refills | Status: AC

## 2020-09-17 MED ORDER — TRASTUZUMAB-ANNS CHEMO 150 MG IV SOLR
6.0000 mg/kg | Freq: Once | INTRAVENOUS | Status: AC
  Administered 2020-09-17: 357 mg via INTRAVENOUS
  Filled 2020-09-17: qty 17

## 2020-09-17 MED ORDER — SODIUM CHLORIDE 0.9 % IV SOLN
20.0000 mg | Freq: Once | INTRAVENOUS | Status: AC
  Administered 2020-09-17: 20 mg via INTRAVENOUS
  Filled 2020-09-17: qty 20

## 2020-09-17 NOTE — Patient Instructions (Signed)
[facility]   Discharge Instructions: Thank you for choosing [facility] to provide your oncology and hematology care.  If you have a lab appointment with the [facility], please go directly to the [facility] and check in at the registration area.  Wear comfortable clothing and clothing appropriate for easy access to any Portacath or PICC line.   We strive to give you quality time with your provider. You may need to reschedule your appointment if you arrive late (15 or more minutes).  Arriving late affects you and other patients whose appointments are after yours.  Also, if you miss three or more appointments without notifying the office, you may be dismissed from the clinic at the provider's discretion.      For prescription refill requests, have your pharmacy contact our office and allow 72 hours for refills to be completed.    Today you received the following chemotherapy and/or immunotherapy agents: Kanjinti, Perjeta, Taxol.      To help prevent nausea and vomiting after your treatment, we encourage you to take your nausea medication as directed.  BELOW ARE SYMPTOMS THAT SHOULD BE REPORTED IMMEDIATELY: *FEVER GREATER THAN 100.4 F (38 C) OR HIGHER *CHILLS OR SWEATING *NAUSEA AND VOMITING THAT IS NOT CONTROLLED WITH YOUR NAUSEA MEDICATION *UNUSUAL SHORTNESS OF BREATH *UNUSUAL BRUISING OR BLEEDING *URINARY PROBLEMS (pain or burning when urinating, or frequent urination) *BOWEL PROBLEMS (unusual diarrhea, constipation, pain near the anus) TENDERNESS IN MOUTH AND THROAT WITH OR WITHOUT PRESENCE OF ULCERS (sore throat, sores in mouth, or a toothache) UNUSUAL RASH, SWELLING OR PAIN  UNUSUAL VAGINAL DISCHARGE OR ITCHING   Items with * indicate a potential emergency and should be followed up as soon as possible or go to the Emergency Department if any problems should occur.  Please show the CHEMOTHERAPY ALERT CARD or IMMUNOTHERAPY ALERT  CARD at check-in to the Emergency Department and triage nurse.  Should you have questions after your visit or need to cancel or reschedule your appointment, please contact [facility]  [phone] and follow the prompts.  Office hours are 8:00 a.m. to 4:30 p.m. Monday - Friday. Please note that voicemails left after 4:00 p.m. may not be returned until the following business day.  We are closed weekends and major holidays. You have access to a nurse at all times for urgent questions. Please call the main number to the clinic [phone] and follow the prompts.  For any non-urgent questions, you may also contact your provider using MyChart. We now offer e-Visits for anyone 2 and older to request care online for non-urgent symptoms. For details visit [email or web address].   Also download the MyChart app! Go to the app store, search "MyChart", open the app, select [facility], and log in with your MyChart username and password.  Due to Covid, a mask is required upon entering the hospital/clinic. If you do not have a mask, one will be given to you upon arrival. For doctor visits, patients may have 1 support person aged 57 or older with them. For treatment visits, patients cannot have anyone with them due to current Covid guidelines and our immunocompromised population.

## 2020-09-17 NOTE — Progress Notes (Signed)
Potassium: 3.2. MD, Dr. Janese Banks, notified and aware. Per MD order: prescription for Potassium is being sent to patient's pharmacy for patient to pick up and start taking at home. Patient informed and verbalized understanding.

## 2020-09-19 ENCOUNTER — Encounter: Payer: Self-pay | Admitting: Oncology

## 2020-09-19 NOTE — Progress Notes (Signed)
Hematology/Oncology Consult note [facility]  Telephone:([phone] [phone]  Patient Care Team: [facility], [place], NP as PCP - General Rico Junker, RN as Registered Nurse Theodore Demark, RN as Registered Nurse Sindy Guadeloupe, MD as Consulting Physician (Hematology and Oncology)   Name of the patient: Sophia [name]  [id number]  1981/02/02   Date of visit: 09/19/20  Diagnosis- stage IV metastatic breast cancer ER/PR negative HER-2/neu positive with bone metastases      Chief complaint/ Reason for visit- on treatment assessment prior to cycle 18 day 1 of taxol herceptin and perjeta  Heme/Onc history: patient is a 40 year old Hispanic female who is here with her friend.  History obtained with the help of an interpreter.Patient self palpated left breast mass which was followed by a diagnostic bilateral mammogram.  Mammogram showed 3.1 x 2.9 x 1.9 cm hypoechoic mass at the 1 o'clock position of the left breast.  For abnormal cortically thickened left axillary lymph nodes measuring up to 5 mm.  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  Patient was also having ongoing back pain and was seen by [facility] orthopedics Dr. Doyle Askew who ordered MRI lumbar spine without contrast which showed possible pathologic fractures of L1 and L4 vertebral bodies with greater than 50% height loss at L1 and abnormal signal involving L2-L3 S1 as well as right iliac bone concerning for metastatic disease.  Patient is a single mother of 3 adult children and is very anxious today.  She reports significant back pain which radiates to her bilateral thighs.  Denies any focal tingling numbness or weakness.  Denies any bowel bladder incontinence.  Pain has been uncontrolled despite taking Tylenol.  No prior history of abnormal breast biopsies.  No family  history of breast cancer   PET and MRI showed 3 areas of pathologic fracture of her spine as well as widespread bony metastatic disease and concern for impending fracture of the right hip.  Given her worsening pain she was asked to come to the ER.  She has been evaluated by Dr. Rudene Christians from orthopedic surgery and underwent kyphoplasty at 3 different levels. T6 L1 and L4 along with radiofrequency ablation.    She also underwent prophylactic fixation of the right hip and not affected the sacral region.     Patient received first dose of Herceptin and Perjeta on 09/04/2019.  Baseline echocardiogram normal.she is currently getting taxol/ herceptin/perjeta   Patient admitted to hospital for acute abdominal pain with CT findings concerning for acute colitis.  Colonoscopy showed diffuse severe inflammation with erythema friability and loss of vascularity and shallow ulcerations in the IC valve, ascending colon and cecum.  Interval history- History obtained with the help of spanish interpretor. back pain and right lower extremity pain is currently well controlled. Denies any tingling numbness in her extremities. Occasional scalp/ pain over left parietal skull area  ECOG PS- 1 Pain scale- 0   Review of systems- Review of Systems  Constitutional:  Positive for malaise/fatigue. Negative for chills, fever and weight loss.  HENT:  Negative for congestion, ear discharge and nosebleeds.   Eyes:  Negative for blurred vision.  Respiratory:  Negative for cough, hemoptysis, sputum production, shortness of breath and wheezing.   Cardiovascular:  Negative for chest pain, palpitations, orthopnea and claudication.  Gastrointestinal:  Negative for abdominal pain, blood  in stool, constipation, diarrhea, heartburn, melena, nausea and vomiting.  Genitourinary:  Negative for dysuria, flank pain, frequency, hematuria and urgency.  Musculoskeletal:  Positive for back pain. Negative for joint pain and myalgias.  Skin:   Negative for rash.  Neurological:  Negative for dizziness, tingling, focal weakness, seizures, weakness and headaches.  Endo/Heme/Allergies:  Does not bruise/bleed easily.  Psychiatric/Behavioral:  Negative for depression and suicidal ideas. The patient does not have insomnia.      No Known Allergies   Past Medical History:  Diagnosis Date   Anxiety    Breast cancer ([place])    with mets   Cancer ([place])    Colitis    COVID-19 in immunocompromised patient ([place])    Family history of colon cancer    Vertigo      Past Surgical History:  Procedure Laterality Date   BREAST BIOPSY Left 08/14/2019   [place] bx of mass, coil marker, path pending   BREAST BIOPSY Left 08/14/2019   [place] bx of LN, hydromarker, path pending   BREAST BIOPSY Left 08/14/2019   affirm bx of calcs, x marker, path pending   ESOPHAGOGASTRODUODENOSCOPY (EGD) WITH PROPOFOL N/A 10/05/2019   Procedure: ESOPHAGOGASTRODUODENOSCOPY (EGD) WITH PROPOFOL;  Surgeon: Lin Landsman, MD;  Location: [facility];  Service: Gastroenterology;  Laterality: N/A;   FLEXIBLE SIGMOIDOSCOPY N/A 10/05/2019   Procedure: FLEXIBLE SIGMOIDOSCOPY;  Surgeon: Lin Landsman, MD;  Location: [facility] ENDOSCOPY;  Service: Gastroenterology;  Laterality: N/A;   INTRAMEDULLARY (IM) NAIL INTERTROCHANTERIC Right 09/01/2019   Procedure: INTRAMEDULLARY (IM) NAIL INTERTROCHANTRIC AND RADIOFREQUENCY ABLATION;  Surgeon: Hessie Knows, MD;  Location: [facility] ORS;  Service: Orthopedics;  Laterality: Right;   KYPHOPLASTY N/A 08/29/2019   Procedure: KYPHOPLASTY T6, L1,L4 ,  RADIOFREQUENCY ABLATION;  Surgeon: Hessie Knows, MD;  Location: [facility] ORS;  Service: Orthopedics;  Laterality: N/A;   KYPHOPLASTY Right 09/01/2019   Procedure: Right Sacral Radiofrequency Ablation and Cement Augmentation, Right sacrum and iliac crest;  Surgeon: Hessie Knows, MD;  Location: [facility] ORS;  Service: Orthopedics;  Laterality: Right;   PORTA CATH INSERTION N/A 08/28/2019   Procedure: PORTA  CATH INSERTION;  Surgeon: Algernon Huxley, MD;  Location: [facility] CV LAB;  Service: Cardiovascular;  Laterality: N/A;    Social History   Socioeconomic History   Marital status: Single    Spouse name: Not on file   Number of children: Not on file   Years of education: Not on file   Highest education level: Not on file  Occupational History   Not on file  Tobacco Use   Smoking status: Never   Smokeless tobacco: Never  Vaping Use   Vaping Use: Never used  Substance and Sexual Activity   Alcohol use: Not Currently   Drug use: Not Currently   Sexual activity: Not Currently    Birth control/protection: None  Other Topics Concern   Not on file  Social History Narrative   Lives at home with children   Social Determinants of Health   Financial Resource Strain: Not on file  Food Insecurity: Not on file  Transportation Needs: Not on file  Physical Activity: Not on file  Stress: Not on file  Social Connections: Not on file  Intimate Partner Violence: Not on file    Family History  Problem Relation Age of Onset   Colon cancer Maternal Uncle      Current Outpatient Medications:    albuterol (VENTOLIN HFA) 108 (90 Base) MCG/ACT inhaler, Inhale 2 puffs into the lungs every 6 (  six) hours as needed for wheezing or shortness of breath., Disp: 8 g, Rfl: 2   clindamycin (CLINDAGEL) 1 % gel, Apply topically 2 (two) times daily. X 7 days., Disp: 30 g, Rfl: 0   cyclobenzaprine (FLEXERIL) 5 MG tablet, Take 1 tablet (5 mg total) by mouth 3 (three) times daily as needed for muscle spasms., Disp: 30 tablet, Rfl: 0   diphenoxylate-atropine (LOMOTIL) 2.5-0.025 MG tablet, Take 1 tablet by mouth 4 (four) times daily as needed for diarrhea or loose stools., Disp: 40 tablet, Rfl: 0   ergocalciferol (VITAMIN D2) 1.25 MG (50000 UT) capsule, Take 1 capsule (50,000 Units total) by mouth once a week. For 4 weeks. Then 1 capsule once monthly for 4 months, Disp: 8 capsule, Rfl: 0   FLUoxetine (PROZAC)  10 MG capsule, Take 1 capsule (10 mg total) by mouth daily., Disp: 30 capsule, Rfl: 3   gabapentin (NEURONTIN) 600 MG tablet, Take 1 tablet (600 mg total) by mouth 3 (three) times daily., Disp: 90 tablet, Rfl: 2   lidocaine-prilocaine (EMLA) cream, Apply 1 application topically as needed. Apply small amount to port site at least 1 hour prior to it being accessed, cover with plastic wrap, Disp: 30 g, Rfl: 1   ondansetron (ZOFRAN) 8 MG tablet, Take 1 tablet (8 mg total) by mouth every 8 (eight) hours as needed for nausea or vomiting., Disp: 45 tablet, Rfl: 2   oxycodone (OXY-IR) 5 MG capsule, Take 1-2 capsules (5-10 mg total) by mouth every 4 (four) hours as needed., Disp: 90 capsule, Rfl: 0   oxyCODONE (OXYCONTIN) 10 mg 12 hr tablet, Take 1 tablet (10 mg total) by mouth every 12 (twelve) hours., Disp: 60 tablet, Rfl: 0   potassium chloride SA (KLOR-CON) 20 MEQ tablet, Take 1 tablet (20 mEq total) by mouth daily for 7 days., Disp: 7 tablet, Rfl: 0   sulfamethoxazole-trimethoprim (BACTRIM DS) 800-160 MG tablet, Take 1 tablet by mouth 2 (two) times daily for 5 days., Disp: 10 tablet, Rfl: 0   potassium chloride SA (KLOR-CON) 20 MEQ tablet, Take by mouth. (Patient not taking: No sig reported), Disp: , Rfl:    prochlorperazine (COMPAZINE) 10 MG tablet, Take 1 tablet (10 mg total) by mouth every 6 (six) hours as needed for nausea or vomiting. (Patient not taking: No sig reported), Disp: 45 tablet, Rfl: 2 No current facility-administered medications for this visit.  Facility-Administered Medications Ordered in Other Visits:    0.9 %  sodium chloride infusion, , Intravenous, Continuous, Burns, Wandra Feinstein, NP, Stopped at 06/02/20 1453  Physical exam:  Vitals:   09/17/20 1032  BP: (!) 116/57  Pulse: 84  Resp: 20  Temp: (!) 97 F (36.1 C)  SpO2: 100%  Weight: 133 lb 11.2 oz (60.6 kg)   Physical Exam Constitutional:      General: She is not in acute distress. Cardiovascular:     Rate and Rhythm:  Normal rate and regular rhythm.  Pulmonary:     Effort: Pulmonary effort is normal.  Skin:    General: Skin is warm and dry.  Neurological:     Mental Status: She is alert and oriented to person, place, and time.     CMP Latest Ref Rng & Units 09/17/2020  Glucose 70 - 99 mg/dL 149(H)  BUN 6 - 20 mg/dL 10  Creatinine 0.44 - 1.00 mg/dL 0.56  Sodium 135 - 145 mmol/L 135  Potassium 3.5 - 5.1 mmol/L 3.2(L)  Chloride 98 - 111 mmol/L 103  CO2 22 -  32 mmol/L 23  Calcium 8.9 - 10.3 mg/dL 8.6(L)  Total Protein 6.5 - 8.1 g/dL 7.5  Total Bilirubin 0.3 - 1.2 mg/dL 0.8  Alkaline Phos 38 - 126 U/L 85  AST 15 - 41 U/L 34  ALT 0 - 44 U/L 31   CBC Latest Ref Rng & Units 09/17/2020  WBC 4.0 - 10.5 K/uL 6.8  Hemoglobin 12.0 - 15.0 g/dL 10.8(L)  Hematocrit 36.0 - 46.0 % 34.8(L)  Platelets 150 - 400 K/uL 439(H)     Assessment and plan- Patient is a 40 y.o. female with metastatic ER/PR negative HER-2 positive breast cancer with bone metastases.  She is here for on treatment assessment prior to cycle 19-day 1 of Taxol Herceptin and Perjeta  Counts okay to proceed with cycle 19-day 1 of Taxol Herceptin and Perjeta today.  There is no room for chemo next week and therefore she will skip her Taxol next week and she will return in 2 weeksWhen she gets Taxol and Xgeva.  She will be seen by covering NP in 3 weeks with CBC with differential CMP CA 27-29 for Taxol Herceptin and Perjeta.  She will need CT chest abdomen and pelvis with contrast and bone scan prior.  Echocardiogram in 2 to 3 weeks.  I will see her back in 6 weeks for cycle 21 of Taxol Herceptin and Perjeta.  Plan is to continue this until progression or toxicity  Neoplasm related pain: Continue as needed oxycodone.  Insomnia: Continue as needed trazodone   Visit Diagnosis 1. Metastatic breast cancer ([place])   2. Burning with urination   3. Primary cancer of left breast with metastasis to other site ([place])   4. Bone metastases ([place])      Dr.  Randa Evens, MD, MPH [facility] [phone] 09/19/2020 9:56 AM

## 2020-09-21 ENCOUNTER — Other Ambulatory Visit: Payer: Self-pay

## 2020-09-21 ENCOUNTER — Inpatient Hospital Stay (HOSPITAL_BASED_OUTPATIENT_CLINIC_OR_DEPARTMENT_OTHER): Payer: Self-pay | Admitting: Hospice and Palliative Medicine

## 2020-09-21 DIAGNOSIS — Z515 Encounter for palliative care: Secondary | ICD-10-CM

## 2020-09-21 DIAGNOSIS — C7951 Secondary malignant neoplasm of bone: Secondary | ICD-10-CM

## 2020-09-21 DIAGNOSIS — C50919 Malignant neoplasm of unspecified site of unspecified female breast: Secondary | ICD-10-CM

## 2020-09-21 MED ORDER — OXYCODONE HCL ER 10 MG PO T12A: 10.0000 mg | EXTENDED_RELEASE_TABLET | Freq: Two times a day (BID) | ORAL | 0 refills | Status: DC

## 2020-09-21 NOTE — Progress Notes (Signed)
Virtual Visit via Telephone Note  I connected with Sophia [name] on 09/21/20 at  2:00 PM EDT by telephone and verified that I am speaking with the correct person using two identifiers.  Location: Patient: Home Provider: Clinic   I discussed the limitations, risks, security and privacy concerns of performing an evaluation and management service by telephone and the availability of in person appointments. I also discussed with the patient that there may be a patient responsible charge related to this service. The patient expressed understanding and agreed to proceed.   History of Present Illness: Sophia [name] is a 40 y.o. 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    Palliative care consulted to help address goals and manage ongoing symptoms.   Observations/Objective: I spoke with patient by phone.  She reports that she is doing reasonably well.  She denies significant changes or concerns.  No symptomatic complaints at present.  She reports stable performance status.  Oral intake is reportedly poor.  However, weight has been relatively stable.  Will refer to nutritionist.  Assessment and Plan: Stage IV breast cancer - on systemic chemotherapy.  Followed by Dr. Janese Banks.  Appears to be doing well symptomatically and tolerating chemotherapy.  Poor oral intake.  Will refer to nutritionist.  Follow Up Instructions: 3 weeks   I discussed the assessment and treatment plan with the patient. The patient was provided an opportunity to ask questions and all were answered. The patient agreed with the plan and demonstrated an understanding of the instructions.   The patient was advised to call back or seek an in-person evaluation if the symptoms worsen or if the condition fails to improve as anticipated.  I provided 10 minutes of non-face-to-face time during this encounter.   Irean Hong, NP

## 2020-09-27 ENCOUNTER — Inpatient Hospital Stay: Payer: Self-pay

## 2020-09-27 ENCOUNTER — Other Ambulatory Visit: Payer: Self-pay

## 2020-09-27 VITALS — BP 113/76 | HR 78 | Temp 96.4°F | Resp 20 | Wt 136.4 lb

## 2020-09-27 DIAGNOSIS — M8440XA Pathological fracture, unspecified site, initial encounter for fracture: Secondary | ICD-10-CM

## 2020-09-27 DIAGNOSIS — Z5111 Encounter for antineoplastic chemotherapy: Secondary | ICD-10-CM

## 2020-09-27 DIAGNOSIS — C50912 Malignant neoplasm of unspecified site of left female breast: Secondary | ICD-10-CM

## 2020-09-27 DIAGNOSIS — C50919 Malignant neoplasm of unspecified site of unspecified female breast: Secondary | ICD-10-CM

## 2020-09-27 DIAGNOSIS — C7951 Secondary malignant neoplasm of bone: Secondary | ICD-10-CM

## 2020-09-27 LAB — CBC WITH DIFFERENTIAL/PLATELET
Abs Immature Granulocytes: 0.02 10*3/uL (ref 0.00–0.07)
Basophils Absolute: 0 10*3/uL (ref 0.0–0.1)
Basophils Relative: 1 %
Eosinophils Absolute: 0.2 10*3/uL (ref 0.0–0.5)
Eosinophils Relative: 3 %
HCT: 32.6 % — ABNORMAL LOW (ref 36.0–46.0)
Hemoglobin: 10.4 g/dL — ABNORMAL LOW (ref 12.0–15.0)
Immature Granulocytes: 0 %
Lymphocytes Relative: 24 %
Lymphs Abs: 1.2 10*3/uL (ref 0.7–4.0)
MCH: 25.1 pg — ABNORMAL LOW (ref 26.0–34.0)
MCHC: 31.9 g/dL (ref 30.0–36.0)
MCV: 78.7 fL — ABNORMAL LOW (ref 80.0–100.0)
Monocytes Absolute: 0.6 10*3/uL (ref 0.1–1.0)
Monocytes Relative: 12 %
Neutro Abs: 3 10*3/uL (ref 1.7–7.7)
Neutrophils Relative %: 60 %
Platelets: 400 10*3/uL (ref 150–400)
RBC: 4.14 MIL/uL (ref 3.87–5.11)
RDW: 19 % — ABNORMAL HIGH (ref 11.5–15.5)
WBC: 4.9 10*3/uL (ref 4.0–10.5)
nRBC: 0 % (ref 0.0–0.2)

## 2020-09-27 LAB — COMPREHENSIVE METABOLIC PANEL
ALT: 26 U/L (ref 0–44)
AST: 36 U/L (ref 15–41)
Albumin: 3.5 g/dL (ref 3.5–5.0)
Alkaline Phosphatase: 72 U/L (ref 38–126)
Anion gap: 9 (ref 5–15)
BUN: 9 mg/dL (ref 6–20)
CO2: 21 mmol/L — ABNORMAL LOW (ref 22–32)
Calcium: 8.7 mg/dL — ABNORMAL LOW (ref 8.9–10.3)
Chloride: 107 mmol/L (ref 98–111)
Creatinine, Ser: 0.46 mg/dL (ref 0.44–1.00)
GFR, Estimated: >60 mL/min (ref >60)
Glucose, Bld: 127 mg/dL — ABNORMAL HIGH (ref 70–99)
Potassium: 3.3 mmol/L — ABNORMAL LOW (ref 3.5–5.1)
Sodium: 137 mmol/L (ref 135–145)
Total Bilirubin: 0.4 mg/dL (ref 0.3–1.2)
Total Protein: 6.8 g/dL (ref 6.5–8.1)

## 2020-09-27 LAB — FERRITIN: Ferritin: 8 ng/mL — ABNORMAL LOW (ref 11–307)

## 2020-09-27 LAB — IRON AND TIBC
Iron: 24 ug/dL — ABNORMAL LOW (ref 28–170)
Saturation Ratios: 5 % — ABNORMAL LOW (ref 10.4–31.8)
TIBC: 472 ug/dL — ABNORMAL HIGH (ref 250–450)
UIBC: 448 ug/dL

## 2020-09-27 LAB — FOLATE: Folate: 12.3 ng/mL (ref >5.9)

## 2020-09-27 LAB — VITAMIN B12: Vitamin B-12: 348 pg/mL (ref 180–914)

## 2020-09-27 MED ORDER — HEPARIN SOD (PORK) LOCK FLUSH 100 UNIT/ML IV SOLN
500.0000 [IU] | Freq: Once | INTRAVENOUS | Status: AC
  Administered 2020-09-27: 500 [IU] via INTRAVENOUS
  Filled 2020-09-27: qty 5

## 2020-09-27 MED ORDER — SODIUM CHLORIDE 0.9% FLUSH
10.0000 mL | Freq: Once | INTRAVENOUS | Status: AC
Start: 2020-09-27 — End: 2020-09-27
  Administered 2020-09-27: 10 mL via INTRAVENOUS
  Filled 2020-09-27: qty 10

## 2020-09-27 MED ORDER — FAMOTIDINE 20 MG IN NS 100 ML IVPB
20.0000 mg | Freq: Once | INTRAVENOUS | Status: AC
  Administered 2020-09-27: 20 mg via INTRAVENOUS
  Filled 2020-09-27: qty 20

## 2020-09-27 MED ORDER — SODIUM CHLORIDE 0.9 % IV SOLN
Freq: Once | INTRAVENOUS | Status: AC
  Filled 2020-09-27: qty 250

## 2020-09-27 MED ORDER — HEPARIN SOD (PORK) LOCK FLUSH 100 UNIT/ML IV SOLN
INTRAVENOUS | Status: AC
  Filled 2020-09-27: qty 5

## 2020-09-27 MED ORDER — SODIUM CHLORIDE 0.9 % IV SOLN
65.0000 mg/m2 | Freq: Once | INTRAVENOUS | Status: AC
  Administered 2020-09-27: 108 mg via INTRAVENOUS
  Filled 2020-09-27: qty 18

## 2020-09-27 MED ORDER — ACETAMINOPHEN 325 MG PO TABS
650.0000 mg | ORAL_TABLET | Freq: Once | ORAL | Status: AC
  Administered 2020-09-27: 650 mg via ORAL

## 2020-09-27 MED ORDER — DENOSUMAB 120 MG/1.7ML ~~LOC~~ SOLN
120.0000 mg | Freq: Once | SUBCUTANEOUS | Status: AC
Start: 2020-09-27 — End: 2020-09-27
  Administered 2020-09-27: 120 mg via SUBCUTANEOUS
  Filled 2020-09-27: qty 1.7

## 2020-09-27 MED ORDER — ACETAMINOPHEN 325 MG PO TABS
ORAL_TABLET | ORAL | Status: AC
  Filled 2020-09-27: qty 2

## 2020-09-27 MED ORDER — DIPHENHYDRAMINE HCL 50 MG/ML IJ SOLN
50.0000 mg | Freq: Once | INTRAMUSCULAR | Status: AC
Start: 2020-09-27 — End: 2020-09-27
  Administered 2020-09-27: 50 mg via INTRAVENOUS
  Filled 2020-09-27: qty 1

## 2020-09-27 MED ORDER — SODIUM CHLORIDE 0.9 % IV SOLN
20.0000 mg | Freq: Once | INTRAVENOUS | Status: AC
  Administered 2020-09-27: 20 mg via INTRAVENOUS
  Filled 2020-09-27: qty 20

## 2020-09-27 NOTE — Patient Instructions (Addendum)
[facility]  Discharge Instructions: Thank you for choosing [facility] to provide your oncology and hematology care.  If you have a lab appointment with the [facility], please go directly to the [facility] and check in at the registration area.  Wear comfortable clothing and clothing appropriate for easy access to any Portacath or PICC line.   We strive to give you quality time with your provider. You may need to reschedule your appointment if you arrive late (15 or more minutes).  Arriving late affects you and other patients whose appointments are after yours.  Also, if you miss three or more appointments without notifying the office, you may be dismissed from the clinic at the provider's discretion.      For prescription refill requests, have your pharmacy contact our office and allow 72 hours for refills to be completed.    Today you received the following chemotherapy and/or immunotherapy agents Taxol      To help prevent nausea and vomiting after your treatment, we encourage you to take your nausea medication as directed.  BELOW ARE SYMPTOMS THAT SHOULD BE REPORTED IMMEDIATELY: *FEVER GREATER THAN 100.4 F (38 C) OR HIGHER *CHILLS OR SWEATING *NAUSEA AND VOMITING THAT IS NOT CONTROLLED WITH YOUR NAUSEA MEDICATION *UNUSUAL SHORTNESS OF BREATH *UNUSUAL BRUISING OR BLEEDING *URINARY PROBLEMS (pain or burning when urinating, or frequent urination) *BOWEL PROBLEMS (unusual diarrhea, constipation, pain near the anus) TENDERNESS IN MOUTH AND THROAT WITH OR WITHOUT PRESENCE OF ULCERS (sore throat, sores in mouth, or a toothache) UNUSUAL RASH, SWELLING OR PAIN  UNUSUAL VAGINAL DISCHARGE OR ITCHING   Items with * indicate a potential emergency and should be followed up as soon as possible or go to the Emergency Department if any problems should occur.  Please show the CHEMOTHERAPY ALERT CARD or IMMUNOTHERAPY ALERT CARD at check-in to  the Emergency Department and triage nurse.  Should you have questions after your visit or need to cancel or reschedule your appointment, please contact [facility]  [phone] and follow the prompts.  Office hours are 8:00 a.m. to 4:30 p.m. Monday - Friday. Please note that voicemails left after 4:00 p.m. may not be returned until the following business day.  We are closed weekends and major holidays. You have access to a nurse at all times for urgent questions. Please call the main number to the clinic [phone] and follow the prompts.  For any non-urgent questions, you may also contact your provider using MyChart. We now offer e-Visits for anyone 53 and older to request care online for non-urgent symptoms. For details visit [email or web address].   Also download the MyChart app! Go to the app store, search "MyChart", open the app, select [facility], and log in with your MyChart username and password.  Due to Covid, a mask is required upon entering the hospital/clinic. If you do not have a mask, one will be given to you upon arrival. For doctor visits, patients may have 1 support person aged 31 or older with them. For treatment visits, patients cannot have anyone with them due to current Covid guidelines and our immunocompromised population. Paclitaxel injection Qu es este medicamento? El PACLITAXEL es un agente quimioteraputico. Este medicamento acta sobre las clulas que se dividen rpidamente, como las clulas cancerosas, y finalmente provoca la muerte de estas clulas. Se utiliza en el tratamiento del cncer deovario, mama, pulmn, sarcoma de [place] y otros tipos de cncer. [place]  para otros usos; si tiene Manufacturing systems engineer con su proveedor de atencin mdica o con su farmacutico. MARCAS COMUNES: Onxol, Taxol Qu le debo informar a mi profesional de la salud antes de tomar estemedicamento? Necesitan saber si  usted presenta alguno de los siguientes problemas osituaciones: antecedentes de frecuencia cardiaca irregular enfermedad heptica recuentos sanguneos bajos, como baja cantidad de glbulos blancos, plaquetas o glbulos rojos enfermedad pulmonar o respiratoria, como asma hormigueo en las manos o los pies, u otro trastorno del sistema nervioso una reaccin alrgica o inusual al paclitaxel, al alcohol, al aceite de ricino polioxietilado, a otros medicamentos quimioteraputicos, a otros medicamentos, alimentos, colorantes o conservantes si est embarazada o buscando quedar embarazada si estamamantando a un beb Cmo debo [employer]? Este medicamento se administra como infusin en una vena. Un profesional de lasalud especialmente capacitado lo administra en un hospital o clnica. Hable con su pediatra para informarse acerca del uso de este medicamento ennios. Puede requerir atencin especial. Sobredosis: Pngase en contacto inmediatamente con un centro toxicolgico o unasala de urgencia si usted cree que haya tomado demasiado medicamento. ATENCIN: [employer] es solo para usted. No comparta este medicamento connadie. Qu sucede si me olvido de una dosis? Es importante no olvidar ninguna dosis. Informe a su mdico o a su profesionalde la salud si no puede asistir a una cita. Qu puede interactuar con este medicamento? No use este medicamento con ninguno de los siguientes frmacos: vacunas de virus vivos Este medicamento tambin puede interactuar con los siguientes frmacos: medicamentos antivirales para la hepatitis, VIH o SIDA ciertos antibiticos, tales como eritromicina y claritromicina ciertos medicamentos para infecciones micticas, tales como itraconazol y ketoconazol ciertos medicamentos para convulsiones, tales como Knappa, fenobarbital y Museum/gallery curator gemfibrozilnefazodona rifampicina hierba de [place] Puede ser que esta lista no menciona todas las posibles  interacciones. Informe a su profesional de [employer]	[place] de [employer] productos a base de hierbas, medicamentos de [place] o suplementos nutritivos que est tomando. Si usted fuma, consume bebidas alcohlicas o si utiliza drogas ilegales, indqueselo tambin a su profesional de [employer]	[place]. Algunas sustancias pueden interactuar consu medicamento. A qu debo estar atento al usar [employer]? Se supervisar su estado de salud atentamente mientras reciba este medicamento. Tendr que hacerse anlisis de sangre importantes mientras est usando estemedicamento. Este medicamento puede causar Chief of Staff graves. Para reducir su riesgo, necesitar tomar otro(s) medicamento(s) antes del tratamiento con este medicamento. Si tiene Chief of Staff como erupcin cutnea, comezn/picazn o urticaria, hinchazn del rostro, los labios, o la lengua,informe de inmediato a su mdico o profesional de Technical sales engineer. En algunos casos, podra recibir medicamentos adicionales para ayudarlo con losefectos secundarios. Siga todas las instrucciones para usarlos. Este medicamento podra hacerle sentir un Nurse, mental health. Esto es normal, ya que la quimioterapia puede afectar tanto a las clulas sanas como a las clulas cancerosas. Si presenta algn efecto secundario, infrmelo. Contine con el tratamiento aun si se siente enfermo, a menos que su mdico le indique que losuspenda. Llame a su mdico o a su profesional de la salud si tiene fiebre, escalofros o dolor de garganta, o cualquier otro sntoma de resfro o gripe. No se trate usted mismo. Este medicamento reduce la capacidad del cuerpo para combatirinfecciones. Trate de no acercarse a personas que estn enfermas. Este medicamento podra aumentar el riesgo de moretones o sangrado. Consulte asu mdico o a su profesional de la salud si observa sangrados inusuales. Proceda con cuidado al cepillar sus dientes, usar hilo dental o utilizar palillos para  los dientes, ya que  podra contraer una infeccin o Therapist, art con mayor facilidad. Si se somete a algn tratamiento dental, informe a su dentistaque est [employer]. Evite usar productos que contienen aspirina, acetaminofeno, ibuprofeno, naproxeno o ketoprofeno, a menos que as lo indique su mdico. Estos productospueden ocultar la fiebre. No debe quedar embarazada mientras est [employer]. Las mujeres deben informar a su mdico si estn buscando quedar embarazadas o si creen que podran estar embarazadas. Existe la posibilidad de efectos secundarios graves en un beb sin nacer. Para obtener ms informacin, hable con su profesional de la salud o su farmacutico. No debe amamantar a un beb mientras est usandoeste medicamento. Para los hombres, se desaconseja concebir hijos mientras reciben estemedicamento. Este producto podra contener alcohol. Pregunte a Midwife o a su proveedor de atencin de la salud si este medicamento contiene alcohol. Asegrese de decirles a todos los proveedores de atencin de la salud que usted est tomando [place]. Ciertos medicamentos, como metronidazol y disulfiram, pueden causar una reaccin desagradable cuando se usan con alcohol. Esta reaccin incluye enrojecimiento, dolor de cabeza, nuseas, vmitos, sudoracin y aumento de la sed. La reaccin puede durar de 30 minutos a variashoras. Qu efectos secundarios puedo tener al [employer] este medicamento? Efectos secundarios que debe informar a su mdico o a su profesional de lasalud tan pronto como sea posible: Chief of Staff, tales como erupcin cutnea, comezn/picazn o urticaria, e hinchazn de la cara, los labios o la lengua problemas para respirar cambios en la visin frecuencia cardiaca rpida e irregular presin sangunea alta o baja llagas en la boca dolor, hormigueo o entumecimiento de las manos o los pies signos de disminucin en la cantidad de plaquetas o sangrado: moretones, puntos rojos en  la piel, heces de color negro y aspecto alquitranado, sangre en la orina signos de disminucin en la cantidad de glbulos rojos: debilidad o cansancio inusuales, sensacin de Secondary school teacher o aturdimiento, cadas signos de infeccin: fiebre o escalofros, tos, dolor de garganta, dolor o dificultad para orinar signos y sntomas de lesin en el hgado, tales como orina amarilla oscura o [place]; sensacin general de estar enfermo o sntomas gripales; heces claras; prdida del apetito; nuseas; dolor en la regin abdominal superior derecha; debilidad o cansancio inusuales; color amarillento de los ojos o lapiel hinchazn de tobillos, pies, manos frecuencia cardiaca inusualmente lenta Efectos secundarios que generalmente no requieren atencin mdica (infrmelos asu mdico o a su profesional de la salud si persisten o si son molestos): diarrea cada del cabello prdida del apetito dolores musculares o articulares nuseas, vmito dolor, enrojecimiento o Actor de la inyeccincansancio Puede ser que esta lista no menciona todos los posibles efectos secundarios. Comunquese a su mdico por asesoramiento mdico [employer]. Usted puede informar los efectos secundarios a la FDA por telfono al1-800-FDA-1088. Dnde debo guardar mi medicina? Este medicamento se administra en hospitales o clnicas, y no necesitarguardarlo en su domicilio. ATENCIN: Este folleto es un resumen. Puede ser que no cubra toda la posible informacin. Si usted tiene preguntas acerca de esta medicina, consulte con sumdico, su farmacutico o su profesional de Technical sales engineer.  2022 Elsevier/Gold Standard (2019-07-10 00:00:00)

## 2020-09-27 NOTE — Progress Notes (Signed)
Has she had Venofer in the past?

## 2020-09-29 NOTE — Progress Notes (Signed)
The following Medication: Sophia [name] has been approved thru [employer] as Assistance Program. Enrollment period is 09/29/2020 to 09/29/2021.  Assistance ID: [id number]. Reason for Assistance: Self First DOS: 10/08/2020  Madalyn Rob, CPhT IV Drug Replacement Specialist  [facility] Phone: [phone]

## 2020-10-04 ENCOUNTER — Other Ambulatory Visit: Payer: Self-pay

## 2020-10-04 ENCOUNTER — Ambulatory Visit
Admission: RE | Admit: 2020-10-04 | Discharge: 2020-10-04 | Disposition: A | Discharge status: Home / Self Care | Payer: Self-pay | Source: Ambulatory Visit | Attending: Oncology | Admitting: Oncology

## 2020-10-04 DIAGNOSIS — C50919 Malignant neoplasm of unspecified site of unspecified female breast: Secondary | ICD-10-CM | POA: Insufficient documentation

## 2020-10-04 DIAGNOSIS — Z01810 Encounter for preprocedural cardiovascular examination: Secondary | ICD-10-CM | POA: Insufficient documentation

## 2020-10-04 DIAGNOSIS — F419 Anxiety disorder, unspecified: Secondary | ICD-10-CM | POA: Insufficient documentation

## 2020-10-04 LAB — ECHOCARDIOGRAM COMPLETE
AR max vel: 1.62 cm2
AV Area VTI: 2.09 cm2
AV Area mean vel: 1.76 cm2
AV Mean grad: 3.5 mmHg
AV Peak grad: 6.5 mmHg
Ao pk vel: 1.27 m/s
Area-P 1/2: 8.25 cm2
S' Lateral: 2.97 cm

## 2020-10-04 NOTE — Progress Notes (Signed)
*  PRELIMINARY RESULTS* Echocardiogram 2D Echocardiogram has been performed.  Sophia [name] 10/04/2020, 12:00 PM

## 2020-10-08 ENCOUNTER — Inpatient Hospital Stay: Payer: Self-pay

## 2020-10-08 ENCOUNTER — Encounter: Payer: Self-pay | Admitting: Hospice and Palliative Medicine

## 2020-10-08 ENCOUNTER — Other Ambulatory Visit: Payer: Self-pay

## 2020-10-08 ENCOUNTER — Inpatient Hospital Stay (HOSPITAL_BASED_OUTPATIENT_CLINIC_OR_DEPARTMENT_OTHER): Payer: Self-pay | Admitting: Hospice and Palliative Medicine

## 2020-10-08 VITALS — BP 114/73 | HR 80 | Temp 97.9°F | Resp 16 | Ht 60.0 in | Wt 136.4 lb

## 2020-10-08 DIAGNOSIS — Z5111 Encounter for antineoplastic chemotherapy: Secondary | ICD-10-CM

## 2020-10-08 DIAGNOSIS — C50912 Malignant neoplasm of unspecified site of left female breast: Secondary | ICD-10-CM

## 2020-10-08 DIAGNOSIS — C50919 Malignant neoplasm of unspecified site of unspecified female breast: Secondary | ICD-10-CM

## 2020-10-08 DIAGNOSIS — M8440XA Pathological fracture, unspecified site, initial encounter for fracture: Secondary | ICD-10-CM

## 2020-10-08 LAB — COMPREHENSIVE METABOLIC PANEL
ALT: 21 U/L (ref 0–44)
AST: 36 U/L (ref 15–41)
Albumin: 3.5 g/dL (ref 3.5–5.0)
Alkaline Phosphatase: 75 U/L (ref 38–126)
Anion gap: 9 (ref 5–15)
BUN: 10 mg/dL (ref 6–20)
CO2: 22 mmol/L (ref 22–32)
Calcium: 8.4 mg/dL — ABNORMAL LOW (ref 8.9–10.3)
Chloride: 106 mmol/L (ref 98–111)
Creatinine, Ser: 0.55 mg/dL (ref 0.44–1.00)
GFR, Estimated: >60 mL/min (ref >60)
Glucose, Bld: 142 mg/dL — ABNORMAL HIGH (ref 70–99)
Potassium: 3.2 mmol/L — ABNORMAL LOW (ref 3.5–5.1)
Sodium: 137 mmol/L (ref 135–145)
Total Bilirubin: 0.3 mg/dL (ref 0.3–1.2)
Total Protein: 6.7 g/dL (ref 6.5–8.1)

## 2020-10-08 LAB — CBC WITH DIFFERENTIAL/PLATELET
Abs Immature Granulocytes: 0.02 10*3/uL (ref 0.00–0.07)
Basophils Absolute: 0 10*3/uL (ref 0.0–0.1)
Basophils Relative: 1 %
Eosinophils Absolute: 0.2 10*3/uL (ref 0.0–0.5)
Eosinophils Relative: 4 %
HCT: 33.2 % — ABNORMAL LOW (ref 36.0–46.0)
Hemoglobin: 10.4 g/dL — ABNORMAL LOW (ref 12.0–15.0)
Immature Granulocytes: 0 %
Lymphocytes Relative: 34 %
Lymphs Abs: 1.5 10*3/uL (ref 0.7–4.0)
MCH: 24.7 pg — ABNORMAL LOW (ref 26.0–34.0)
MCHC: 31.3 g/dL (ref 30.0–36.0)
MCV: 78.9 fL — ABNORMAL LOW (ref 80.0–100.0)
Monocytes Absolute: 0.4 10*3/uL (ref 0.1–1.0)
Monocytes Relative: 8 %
Neutro Abs: 2.4 10*3/uL (ref 1.7–7.7)
Neutrophils Relative %: 53 %
Platelets: 436 10*3/uL — ABNORMAL HIGH (ref 150–400)
RBC: 4.21 MIL/uL (ref 3.87–5.11)
RDW: 19.2 % — ABNORMAL HIGH (ref 11.5–15.5)
WBC: 4.5 10*3/uL (ref 4.0–10.5)
nRBC: 0 % (ref 0.0–0.2)

## 2020-10-08 MED ORDER — SODIUM CHLORIDE 0.9% FLUSH
10.0000 mL | Freq: Once | INTRAVENOUS | Status: AC
  Administered 2020-10-08: 10 mL via INTRAVENOUS
  Filled 2020-10-08: qty 10

## 2020-10-08 MED ORDER — SODIUM CHLORIDE 0.9 % IV SOLN
420.0000 mg | Freq: Once | INTRAVENOUS | Status: AC
  Administered 2020-10-08: 420 mg via INTRAVENOUS
  Filled 2020-10-08: qty 14

## 2020-10-08 MED ORDER — DIPHENHYDRAMINE HCL 50 MG/ML IJ SOLN
50.0000 mg | Freq: Once | INTRAMUSCULAR | Status: AC
  Administered 2020-10-08: 50 mg via INTRAVENOUS
  Filled 2020-10-08: qty 1

## 2020-10-08 MED ORDER — POTASSIUM CHLORIDE CRYS ER 20 MEQ PO TBCR: 20.0000 meq | EXTENDED_RELEASE_TABLET | Freq: Every day | ORAL | 0 refills | Status: DC

## 2020-10-08 MED ORDER — SODIUM CHLORIDE 0.9 % IV SOLN
65.0000 mg/m2 | Freq: Once | INTRAVENOUS | Status: AC
  Administered 2020-10-08: 108 mg via INTRAVENOUS
  Filled 2020-10-08: qty 18

## 2020-10-08 MED ORDER — HEPARIN SOD (PORK) LOCK FLUSH 100 UNIT/ML IV SOLN
INTRAVENOUS | Status: AC
  Filled 2020-10-08: qty 5

## 2020-10-08 MED ORDER — HEPARIN SOD (PORK) LOCK FLUSH 100 UNIT/ML IV SOLN
500.0000 [IU] | Freq: Once | INTRAVENOUS | Status: DC | PRN
  Filled 2020-10-08: qty 5

## 2020-10-08 MED ORDER — FAMOTIDINE 20 MG IN NS 100 ML IVPB
20.0000 mg | Freq: Once | INTRAVENOUS | Status: AC
  Administered 2020-10-08: 20 mg via INTRAVENOUS
  Filled 2020-10-08: qty 20

## 2020-10-08 MED ORDER — CALCIUM CARBONATE-VITAMIN D 500-200 MG-UNIT PO TABS
1.0000 | ORAL_TABLET | Freq: Two times a day (BID) | ORAL | 0 refills | Status: DC
Start: 2020-10-08 — End: 2021-04-05

## 2020-10-08 MED ORDER — SODIUM CHLORIDE 0.9 % IV SOLN
Freq: Once | INTRAVENOUS | Status: AC
  Filled 2020-10-08: qty 250

## 2020-10-08 MED ORDER — TRASTUZUMAB-ANNS CHEMO 150 MG IV SOLR
6.0000 mg/kg | Freq: Once | INTRAVENOUS | Status: AC
  Administered 2020-10-08: 357 mg via INTRAVENOUS
  Filled 2020-10-08: qty 17

## 2020-10-08 MED ORDER — SODIUM CHLORIDE 0.9 % IV SOLN
20.0000 mg | Freq: Once | INTRAVENOUS | Status: AC
  Administered 2020-10-08: 20 mg via INTRAVENOUS
  Filled 2020-10-08: qty 20

## 2020-10-08 MED ORDER — ACETAMINOPHEN 325 MG PO TABS
650.0000 mg | ORAL_TABLET | Freq: Once | ORAL | Status: AC
  Administered 2020-10-08: 650 mg via ORAL
  Filled 2020-10-08: qty 2

## 2020-10-08 MED ORDER — HEPARIN SOD (PORK) LOCK FLUSH 100 UNIT/ML IV SOLN
500.0000 [IU] | Freq: Once | INTRAVENOUS | Status: AC
  Administered 2020-10-08: 500 [IU] via INTRAVENOUS
  Filled 2020-10-08: qty 5

## 2020-10-08 NOTE — Progress Notes (Signed)
Nutrition Follow-up:  Referral from [facility] for poor appetite.   Patient with stage IV metastatic breast cancer.  Patient on taxol, perjeta, kanjinti.  RD last spoke with patient on 12/2019. Weight at that time was 127 lb 1.6 oz  Met with patient during infusion with interpreter services ([employer]) used, [employer]yse Amass #[id number].  Patient reports poor appetite usually 3-4 days after treatment and then appetite increases.  Also feels nauseated on these days.  Tries to eat soups during the days she does not feel good and take nausea medications.      Medications: zofran, compazine, lomotil  Labs: glucose 142, K 3.2  Anthropometrics:   Weight 136 lb 6 oz increased from October 2021 of 127 lb   NUTRITION DIAGNOSIS: Inadequate oral intake related to cancer treatment as evidenced by poor appetite 3-4 days after treatment    INTERVENTION:  Encouraged taking nausea medications Encouraged bland foods during this time frame, small frequent meals Questions answered regarding gluten free Spanish booklet given to patient regarding nutrition and cancer. Contact information given   NEXT VISIT: no follow-up with weight gain  Avaline Stillson B. Zenia Resides, [place], [place] Registered Dietitian [phone] (mobile)

## 2020-10-08 NOTE — Patient Instructions (Signed)
[facility]  Discharge Instructions: Thank you for choosing [facility] to provide your oncology and hematology care.  If you have a lab appointment with the [facility], please go directly to the [facility] and check in at the registration area.  Wear comfortable clothing and clothing appropriate for easy access to any Portacath or PICC line.   We strive to give you quality time with your provider. You may need to reschedule your appointment if you arrive late (15 or more minutes).  Arriving late affects you and other patients whose appointments are after yours.  Also, if you miss three or more appointments without notifying the office, you may be dismissed from the clinic at the provider's discretion.      For prescription refill requests, have your pharmacy contact our office and allow 72 hours for refills to be completed.    Today you received the following chemotherapy and/or immunotherapy agents Kanjinti, Perjeta, Taxol      To help prevent nausea and vomiting after your treatment, we encourage you to take your nausea medication as directed.  BELOW ARE SYMPTOMS THAT SHOULD BE REPORTED IMMEDIATELY: *FEVER GREATER THAN 100.4 F (38 C) OR HIGHER *CHILLS OR SWEATING *NAUSEA AND VOMITING THAT IS NOT CONTROLLED WITH YOUR NAUSEA MEDICATION *UNUSUAL SHORTNESS OF BREATH *UNUSUAL BRUISING OR BLEEDING *URINARY PROBLEMS (pain or burning when urinating, or frequent urination) *BOWEL PROBLEMS (unusual diarrhea, constipation, pain near the anus) TENDERNESS IN MOUTH AND THROAT WITH OR WITHOUT PRESENCE OF ULCERS (sore throat, sores in mouth, or a toothache) UNUSUAL RASH, SWELLING OR PAIN  UNUSUAL VAGINAL DISCHARGE OR ITCHING   Items with * indicate a potential emergency and should be followed up as soon as possible or go to the Emergency Department if any problems should occur.  Please show the CHEMOTHERAPY ALERT CARD or IMMUNOTHERAPY ALERT  CARD at check-in to the Emergency Department and triage nurse.  Should you have questions after your visit or need to cancel or reschedule your appointment, please contact [facility]  [phone] and follow the prompts.  Office hours are 8:00 a.m. to 4:30 p.m. Monday - Friday. Please note that voicemails left after 4:00 p.m. may not be returned until the following business day.  We are closed weekends and major holidays. You have access to a nurse at all times for urgent questions. Please call the main number to the clinic [phone] and follow the prompts.  For any non-urgent questions, you may also contact your provider using MyChart. We now offer e-Visits for anyone 31 and older to request care online for non-urgent symptoms. For details visit [email or web address].   Also download the MyChart app! Go to the app store, search "MyChart", open the app, select [facility], and log in with your MyChart username and password.  Due to Covid, a mask is required upon entering the hospital/clinic. If you do not have a mask, one will be given to you upon arrival. For doctor visits, patients may have 1 support person aged 8 or older with them. For treatment visits, patients cannot have anyone with them due to current Covid guidelines and our immunocompromised population.

## 2020-10-08 NOTE — Progress Notes (Signed)
Pt pain is worse when walking for her right hip area to right top of leg. After chemo she does not eat good for 2 days and then after that she is good. No diarrhea but can take meds if needed.

## 2020-10-08 NOTE — Progress Notes (Signed)
   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 History obtained with the help of an interpreter. Patient self palpated left breast mass which was followed by a diagnostic bilateral mammogram.  Mammogram showed 3.1 x 2.9 x 1.9 cm hypoechoic mass at the 1 o'clock position of the left breast.  For abnormal cortically thickened left axillary lymph nodes measuring up to 5 mm.  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  Patient was also having ongoing back pain and was seen by [facility] Dr. Myers who ordered MRI lumbar spine without contrast which showed possible pathologic fractures of L1 and L4 vertebral bodies with greater than 50% height loss at L1 and abnormal signal involving L2-L3 S1 as well as right iliac bone concerning for metastatic disease.  Patient is a single mother of 3 adult children and is very anxious today.  She reports significant back pain which radiates to her bilateral thighs.  Denies any focal tingling numbness or weakness.  Denies any bowel bladder incontinence.  Pain has been uncontrolled despite taking Tylenol.  No prior history of abnormal breast biopsies.  No family history of breast cancer    PET and MRI showed 3 areas of pathologic fracture of her spine as well as widespread bony metastatic disease and concern for impending fracture of the right hip.  Given her worsening pain she was asked to come to the ER.  She has been evaluated by Dr. Menz from orthopedic surgery and underwent kyphoplasty at 3 different levels. T6 L1 and L4 along with radiofrequency ablation.    She also underwent prophylactic fixation of the right hip and not affected the sacral region.     Patient received first dose of Herceptin and Perjeta on 09/04/2019.  Baseline echocardiogram normal.she is currently getting taxol/ herceptin/perjeta   Patient admitted to hospital for acute abdominal pain with CT findings concerning for acute colitis.  Colonoscopy showed diffuse severe inflammation with erythema friability and loss of vascularity and shallow ulcerations in the IC valve, ascending colon and cecum.  Interval history- History obtained with the help of spanish interpretor.  Patient reports that she is doing well.  She denies significant changes or concerns today.  She reports stable pain, appetite, and performance status.  No numbness or tingling in fingers or lower extremities.  ECOG PS- 1 Pain scale- 0   Review of systems- Review of Systems  Constitutional:  Positive for malaise/fatigue. Negative for chills, fever and weight loss.  HENT:  Negative for congestion, ear discharge and nosebleeds.   Eyes:  Negative for blurred vision.  Respiratory:  Negative for cough, hemoptysis, sputum production, shortness of breath and wheezing.   Cardiovascular:  Negative for chest pain, palpitations, orthopnea and claudication.  Gastrointestinal:  Negative for   abdominal pain, blood in stool, constipation, diarrhea, heartburn, melena, nausea and vomiting.  Genitourinary:  Negative for dysuria, flank pain, frequency, hematuria and urgency.  Musculoskeletal:  Positive for back pain. Negative for joint pain and myalgias.   Skin:  Negative for rash.  Neurological:  Negative for dizziness, tingling, focal weakness, seizures, weakness and headaches.  Endo/Heme/Allergies:  Does not bruise/bleed easily.  Psychiatric/Behavioral:  Negative for depression and suicidal ideas. The patient does not have insomnia.      No Known Allergies   Past Medical History:  Diagnosis Date   Anxiety    Breast cancer (HCC)    with mets   Cancer (HCC)    Colitis    COVID-19 in immunocompromised patient (HCC)    Family history of colon cancer    Vertigo      Past Surgical History:  Procedure Laterality Date   BREAST BIOPSY Left 08/14/2019   us bx of mass, coil marker, path pending   BREAST BIOPSY Left 08/14/2019   us bx of LN, hydromarker, path pending   BREAST BIOPSY Left 08/14/2019   affirm bx of calcs, x marker, path pending   ESOPHAGOGASTRODUODENOSCOPY (EGD) WITH PROPOFOL N/A 10/05/2019   Procedure: ESOPHAGOGASTRODUODENOSCOPY (EGD) WITH PROPOFOL;  Surgeon: Vanga, Rohini Reddy, MD;  Location: [facility] ENDOSCOPY;  Service: Gastroenterology;  Laterality: N/A;   FLEXIBLE SIGMOIDOSCOPY N/A 10/05/2019   Procedure: FLEXIBLE SIGMOIDOSCOPY;  Surgeon: Vanga, Rohini Reddy, MD;  Location: [facility] ENDOSCOPY;  Service: Gastroenterology;  Laterality: N/A;   INTRAMEDULLARY (IM) NAIL INTERTROCHANTERIC Right 09/01/2019   Procedure: INTRAMEDULLARY (IM) NAIL INTERTROCHANTRIC AND RADIOFREQUENCY ABLATION;  Surgeon: Menz, Michael, MD;  Location: [facility] ORS;  Service: Orthopedics;  Laterality: Right;   KYPHOPLASTY N/A 08/29/2019   Procedure: KYPHOPLASTY T6, L1,L4 ,  RADIOFREQUENCY ABLATION;  Surgeon: Menz, Michael, MD;  Location: [facility] ORS;  Service: Orthopedics;  Laterality: N/A;   KYPHOPLASTY Right 09/01/2019   Procedure: Right Sacral Radiofrequency Ablation and Cement Augmentation, Right sacrum and iliac crest;  Surgeon: Menz, Michael, MD;  Location: [facility] ORS;  Service: Orthopedics;  Laterality: Right;   PORTA CATH INSERTION N/A 08/28/2019   Procedure:  PORTA CATH INSERTION;  Surgeon: Dew, Jason S, MD;  Location: [facility];  Service: Cardiovascular;  Laterality: N/A;    Social History   Socioeconomic History   Marital status: Single    Spouse name: Not on file   Number of children: Not on file   Years of education: Not on file   Highest education level: Not on file  Occupational History   Not on file  Tobacco Use   Smoking status: Never   Smokeless tobacco: Never  Vaping Use   Vaping Use: Never used  Substance and Sexual Activity   Alcohol use: Not Currently   Drug use: Not Currently   Sexual activity: Not Currently    Birth control/protection: None  Other Topics Concern   Not on file  Social History Narrative   Lives at home with children   Social Determinants of Health   Financial Resource Strain: Not on file  Food Insecurity: Not on file  Transportation Needs: Not on file  Physical Activity: Not on file  Stress: Not on file  Social Connections: Not on file  Intimate Partner Violence: Not on file    Family History  Problem Relation Age of Onset   Colon cancer Maternal Uncle      Current Outpatient Medications:    clindamycin (CLINDAGEL) 1 % gel, Apply topically 2 (two) times daily. X 7   days., Disp: 30 g, Rfl: 0   ergocalciferol (VITAMIN D2) 1.25 MG (50000 UT) capsule, Take 1 capsule (50,000 Units total) by mouth once a week. For 4 weeks. Then 1 capsule once monthly for 4 months, Disp: 8 capsule, Rfl: 0   gabapentin (NEURONTIN) 600 MG tablet, Take 1 tablet (600 mg total) by mouth 3 (three) times daily., Disp: 90 tablet, Rfl: 2   lidocaine-prilocaine (EMLA) cream, Apply 1 application topically as needed. Apply small amount to port site at least 1 hour prior to it being accessed, cover with plastic wrap, Disp: 30 g, Rfl: 1   ondansetron (ZOFRAN) 8 MG tablet, Take 1 tablet (8 mg total) by mouth every 8 (eight) hours as needed for nausea or vomiting., Disp: 45 tablet, Rfl: 2   oxycodone (OXY-IR) 5 MG  capsule, Take 1-2 capsules (5-10 mg total) by mouth every 4 (four) hours as needed., Disp: 90 capsule, Rfl: 0   oxyCODONE (OXYCONTIN) 10 mg 12 hr tablet, Take 1 tablet (10 mg total) by mouth every 12 (twelve) hours., Disp: 60 tablet, Rfl: 0   potassium chloride SA (KLOR-CON) 20 MEQ tablet, Take 1 tablet (20 mEq total) by mouth daily for 7 days., Disp: 7 tablet, Rfl: 0   albuterol (VENTOLIN HFA) 108 (90 Base) MCG/ACT inhaler, Inhale 2 puffs into the lungs every 6 (six) hours as needed for wheezing or shortness of breath. (Patient not taking: Reported on 10/08/2020), Disp: 8 g, Rfl: 2   cyclobenzaprine (FLEXERIL) 5 MG tablet, Take 1 tablet (5 mg total) by mouth 3 (three) times daily as needed for muscle spasms. (Patient not taking: Reported on 10/08/2020), Disp: 30 tablet, Rfl: 0   diphenoxylate-atropine (LOMOTIL) 2.5-0.025 MG tablet, Take 1 tablet by mouth 4 (four) times daily as needed for diarrhea or loose stools. (Patient not taking: Reported on 10/08/2020), Disp: 40 tablet, Rfl: 0   potassium chloride SA (KLOR-CON) 20 MEQ tablet, Take by mouth., Disp: , Rfl:    prochlorperazine (COMPAZINE) 10 MG tablet, Take 1 tablet (10 mg total) by mouth every 6 (six) hours as needed for nausea or vomiting. (Patient not taking: No sig reported), Disp: 45 tablet, Rfl: 2 No current facility-administered medications for this visit.  Facility-Administered Medications Ordered in Other Visits:    0.9 %  sodium chloride infusion, , Intravenous, Continuous, Burns, Wandra Feinstein, NP, Stopped at 06/02/20 1453   heparin lock flush 100 unit/mL, 500 Units, Intravenous, Once, Sindy Guadeloupe, MD  Physical exam:  There were no vitals filed for this visit.  Physical Exam Constitutional:      General: She is not in acute distress. Cardiovascular:     Rate and Rhythm: Normal rate and regular rhythm.  Pulmonary:     Effort: Pulmonary effort is normal.  Skin:    General: Skin is warm and dry.  Neurological:     Mental Status:  She is alert and oriented to person, place, and time.     CMP Latest Ref Rng & Units 10/08/2020  Glucose 70 - 99 mg/dL 142(H)  BUN 6 - 20 mg/dL 10  Creatinine 0.44 - 1.00 mg/dL 0.55  Sodium 135 - 145 mmol/L 137  Potassium 3.5 - 5.1 mmol/L 3.2(L)  Chloride 98 - 111 mmol/L 106  CO2 22 - 32 mmol/L 22  Calcium 8.9 - 10.3 mg/dL 8.4(L)  Total Protein 6.5 - 8.1 g/dL 6.7  Total Bilirubin 0.3 - 1.2 mg/dL 0.3  Alkaline Phos 38 - 126 U/L 75  AST 15 - 41 U/L 36  ALT 0 - 44 U/L 21   CBC Latest Ref Rng & Units 10/08/2020  WBC 4.0 - 10.5 K/uL 4.5  Hemoglobin 12.0 - 15.0 g/dL 10.4(L)  Hematocrit 36.0 - 46.0 % 33.2(L)  Platelets 150 - 400 K/uL 436(H)     Assessment and plan- Patient is a 39 y.o. female with metastatic ER/PR negative HER-2 positive breast cancer with bone metastases.  She is here for on treatment assessment prior to cycle 19-day 1 of Taxol Herceptin and Perjeta  Stage IV breast cancer: counts okay to proceed with cycle 19-day 1 of Taxol Herceptin and Perjeta today.  Patient will return next week for Taxol and then 3 weeks to see Dr. Rao for consideration cycle 20 Taxol, Herceptin, and Perjeta. Plan is to continue this until progression or toxicity.  Patient is pending CTs next week for restaging.  Hypokalemia: we will restart oral KCl supplementation.  Repeat labs next week  Hypocalcemia: likely from recent Xgeva.  We will start oral calcium/vitamin D supplementation.  Repeat labs next week.  IDA: will speak with Dr. Rao regarding possible IV iron versus oral replacement.  Neoplasm related pain: Continue OxyContin and as needed oxycodone.  Daily bowel regimen to prevent opioid-induced constipation.  Insomnia: Continue as needed trazodone   Visit Diagnosis 1. Primary cancer of left breast with metastasis to other site (HCC)   2. Encounter for antineoplastic chemotherapy       , NP-C [facility] [phone] 10/08/2020 11:17  AM                

## 2020-10-09 LAB — CANCER ANTIGEN 27.29: CA 27.29: 15.2 U/mL (ref 0.0–38.6)

## 2020-10-13 ENCOUNTER — Encounter
Admission: RE | Admit: 2020-10-13 | Discharge: 2020-10-13 | Disposition: A | Discharge status: Home / Self Care | Payer: Self-pay | Source: Ambulatory Visit | Attending: Oncology | Admitting: Oncology

## 2020-10-13 ENCOUNTER — Other Ambulatory Visit: Payer: Self-pay

## 2020-10-13 ENCOUNTER — Ambulatory Visit
Admission: RE | Admit: 2020-10-13 | Discharge: 2020-10-13 | Disposition: A | Discharge status: Home / Self Care | Payer: Self-pay | Source: Ambulatory Visit | Attending: Oncology | Admitting: Oncology

## 2020-10-13 DIAGNOSIS — C50919 Malignant neoplasm of unspecified site of unspecified female breast: Secondary | ICD-10-CM

## 2020-10-13 IMAGING — NM NM BONE WHOLE BODY
2 series · 12 of 12 positions shown · non-contrast
Comparison: [DATE]

CLINICAL DATA: History of breast cancer. Assess treatment response.

EXAM:
NUCLEAR MEDICINE WHOLE BODY BONE SCAN
TECHNIQUE: Whole body anterior and posterior images were obtained approximately
3 hours after intravenous injection of radiopharmaceutical.
RADIOPHARMACEUTICALS:  21.98 mCi [Q9] MDP IV

[Series 1000: 3 hr wholebody · 2.40mm/px · 2 of 2 frames shown]
[frame 1/2]
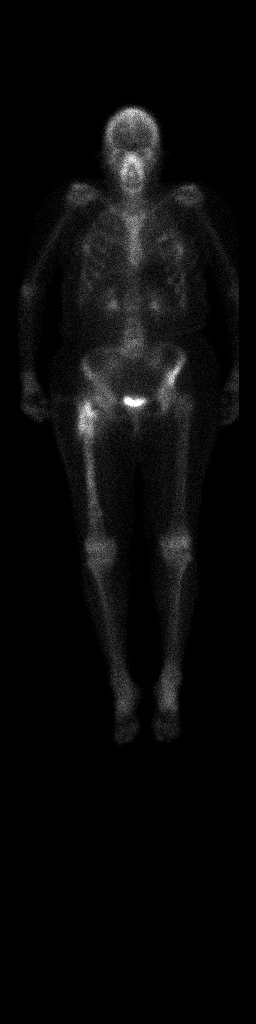
[frame 2/2]
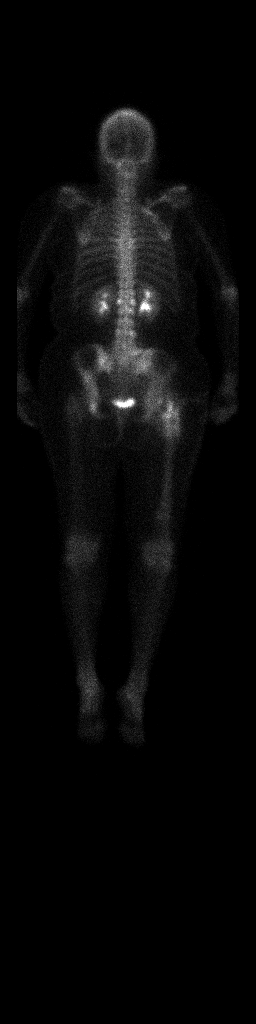

[Series 1000: statics · 2.40mm/px · 5 acquisitions, 10 frames shown]
[im 1/5]
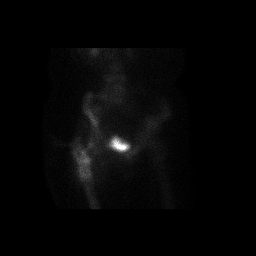
[im 1/5]
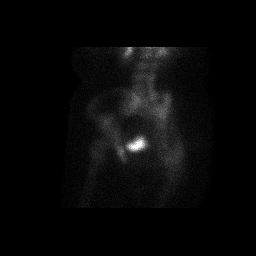
[im 2/5]
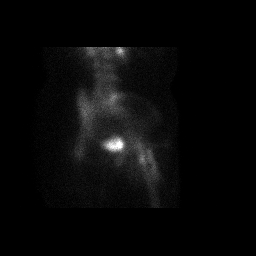
[im 2/5]
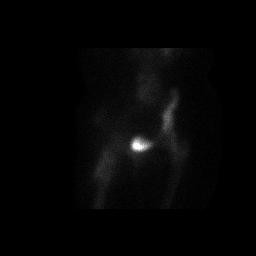
[im 3/5]
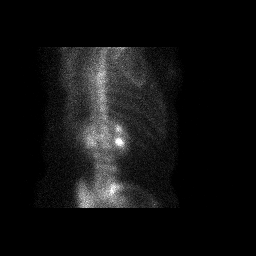
[im 3/5]
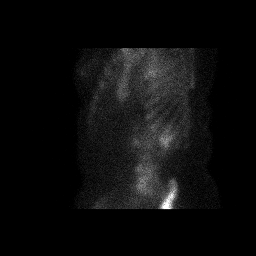
[im 4/5]
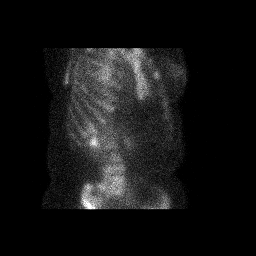
[im 4/5]
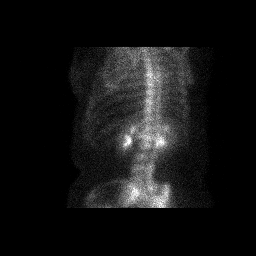
[im 5/5]
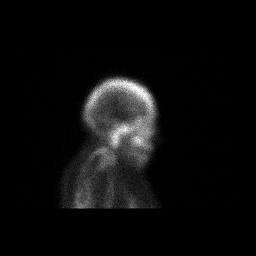
[im 5/5]
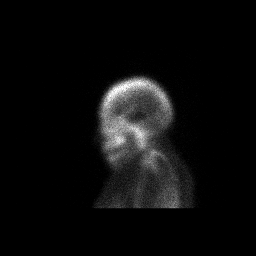

[12 of 12 positions shown; findings below may reference images not displayed]

FINDINGS: Previously noted lesion involving the left occipital bone as
decreased in FDG uptake compared with previous exam. Similar
appearance of asymmetric increased radiotracer uptake localizing to
the left iliac bone. Unchanged radiotracer activity localizing to
the right posterior second rib. Increased radiotracer uptake
localizing to the anterior aspect of the left third rib corresponds
to a focal healing fracture as demonstrated on previous imaging.

Intense radiotracer uptake surrounding patient's proximal right hip
is identified where there are signs a pathologic fracture and an IM
nail and screw device has been placed. Less intense uptake extends
along the shaft of the IM nail into the distal femur. When compared
with the previous bone scan the degree of FDG uptake appears
similar.
IMPRESSION: 1. No clear progression osseous metastatic disease.
2. Improved appearance left occipital bone metastases.
3. Persistent increased uptake within the proximal right femur and
extending along the shaft right IM nail is identified.

## 2020-10-13 IMAGING — CT CT CHEST-ABD-PELV W/ CM
3 of 5 series · 15 of 36 positions shown, 17 images · IV contrast (omnipaque)
Comparison: CT abdomen pelvis, [DATE], CT chest abdomen pelvis,
[DATE]

CLINICAL DATA: Metastatic left breast cancer

EXAM:
CT CHEST, ABDOMEN, AND PELVIS WITH CONTRAST
TECHNIQUE: Multidetector CT imaging of the chest, abdomen and pelvis was
performed following the standard protocol during bolus
administration of intravenous contrast.
CONTRAST:  75mL OMNIPAQUE IOHEXOL 350 MG/ML SOLN

[Series 2: cap with (person_name) · axial · 0.70mm/px · z∈[-817,-357]mm · 10 of 114 slices shown, 12 images]
[im 11/114  mediastinal]
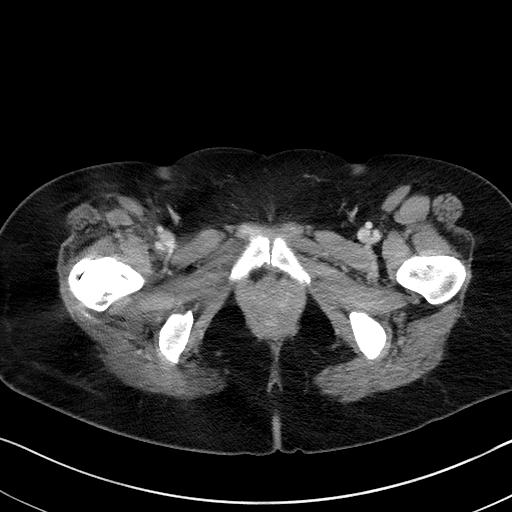
[im 11/114  bone]
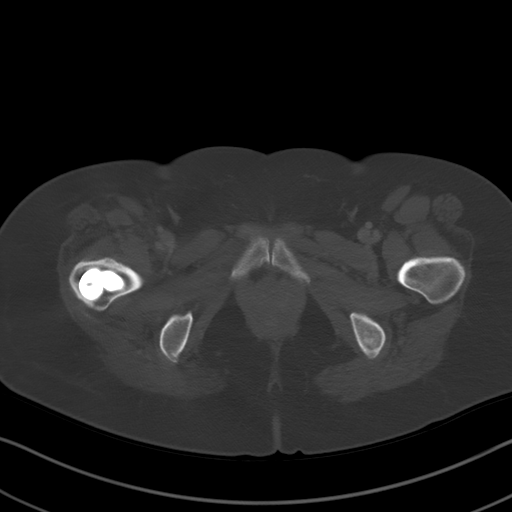
[im 21/114  mediastinal]
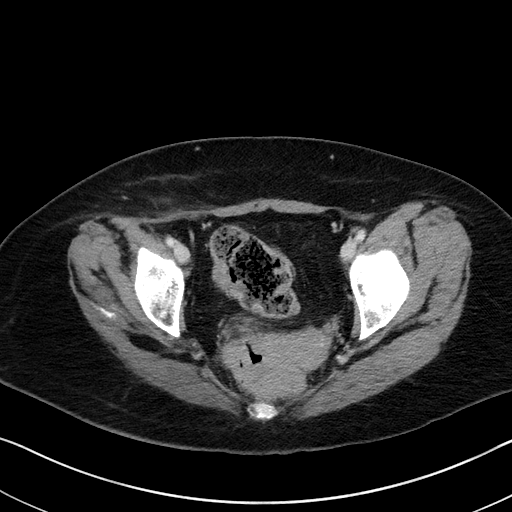
[im 31/114  mediastinal]
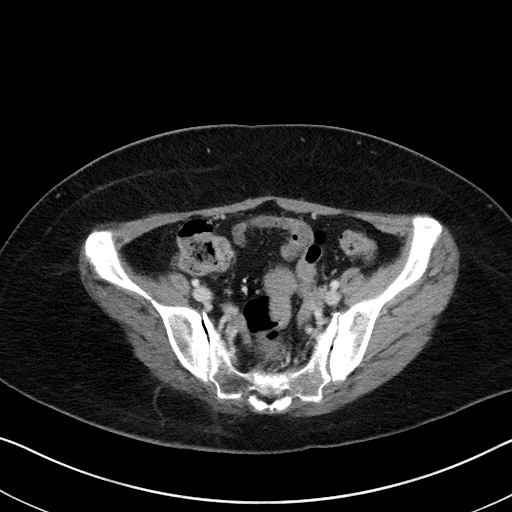
[im 42/114  mediastinal]
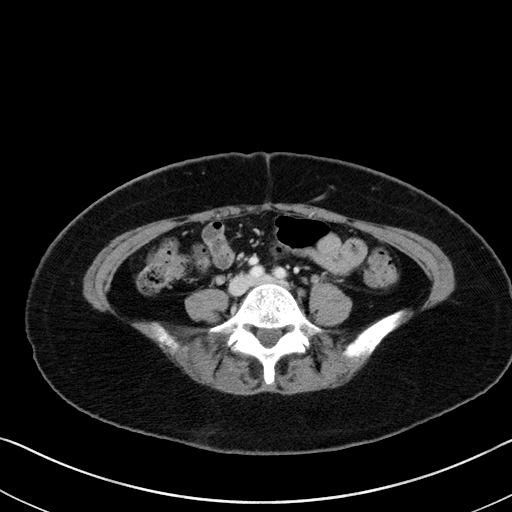
[im 52/114  mediastinal]
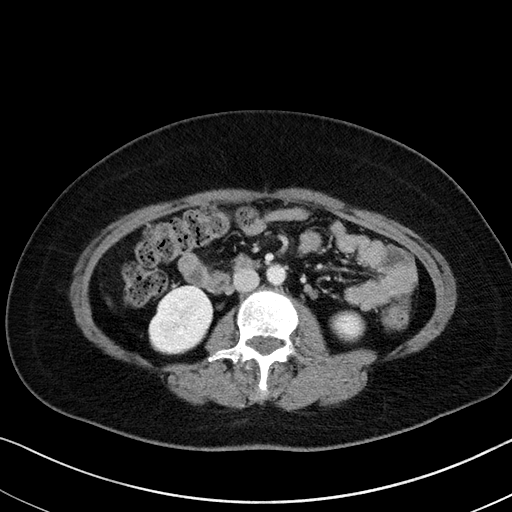
[im 62/114  mediastinal]
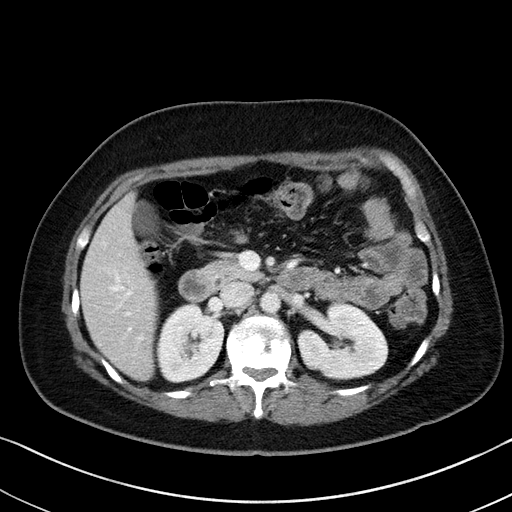
[im 72/114  mediastinal]
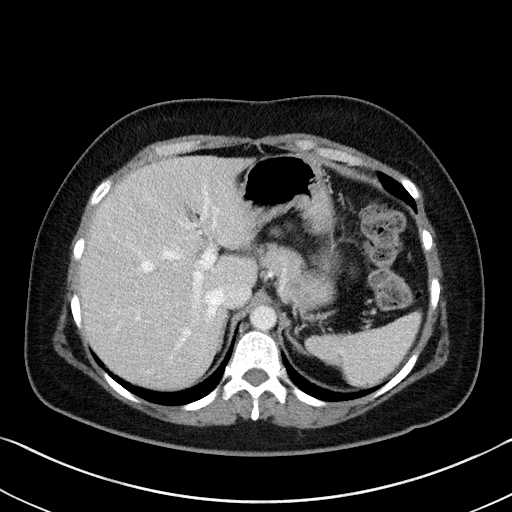
[im 83/114  mediastinal]
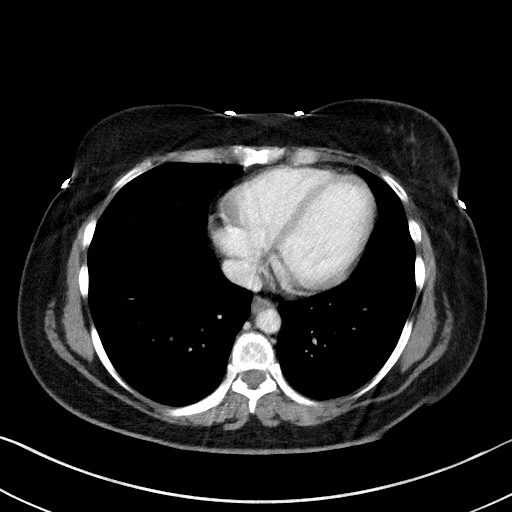
[im 93/114  mediastinal]
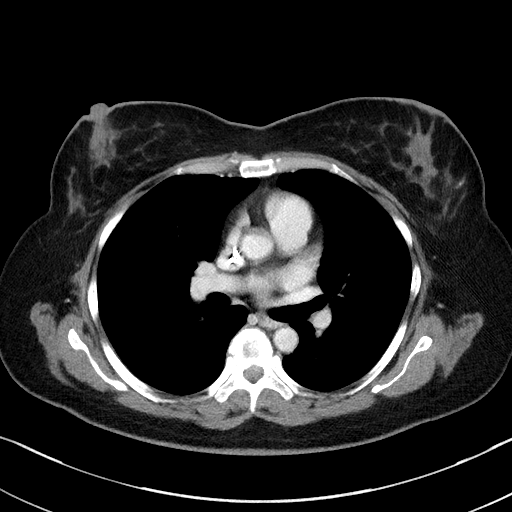
[im 93/114  bone]
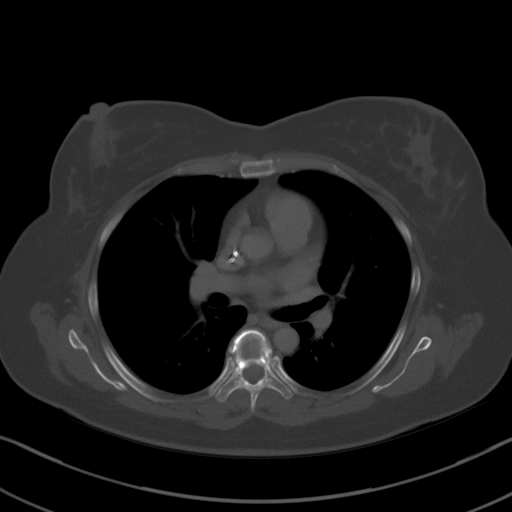
[im 103/114  mediastinal]
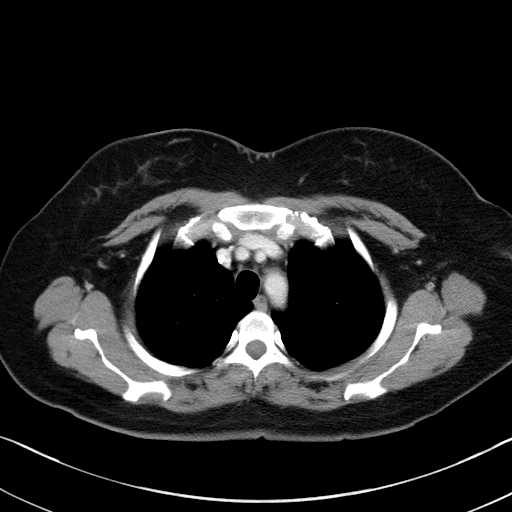

[Series 4: lung · axial · 0.70mm/px · z∈[-542,-498]mm · 2 of 131 slices shown]
[im 11/131  bone]
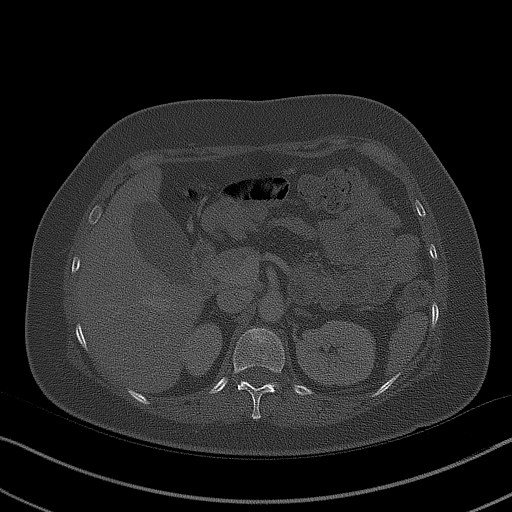
[im 33/131  bone]
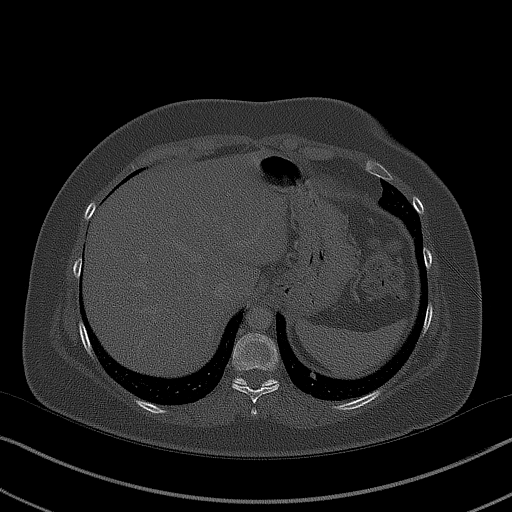

[Series 5: coronals · coronal · 0.81mm/px · 3 of 120 slices shown]
[im 24/120  mediastinal]
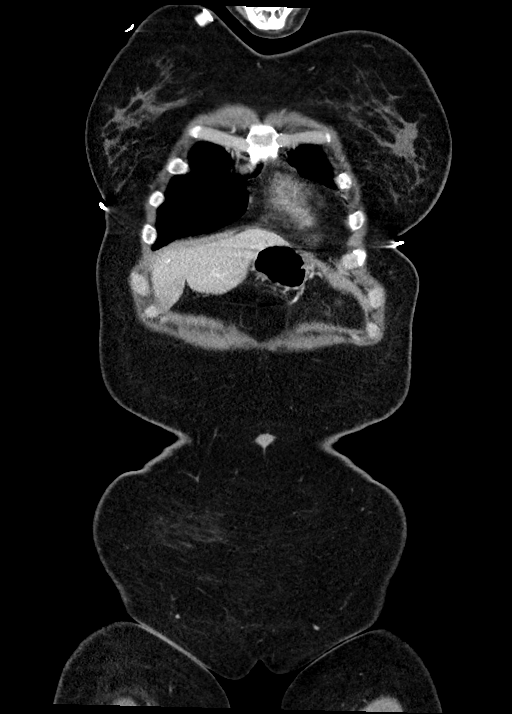
[im 48/120  mediastinal]
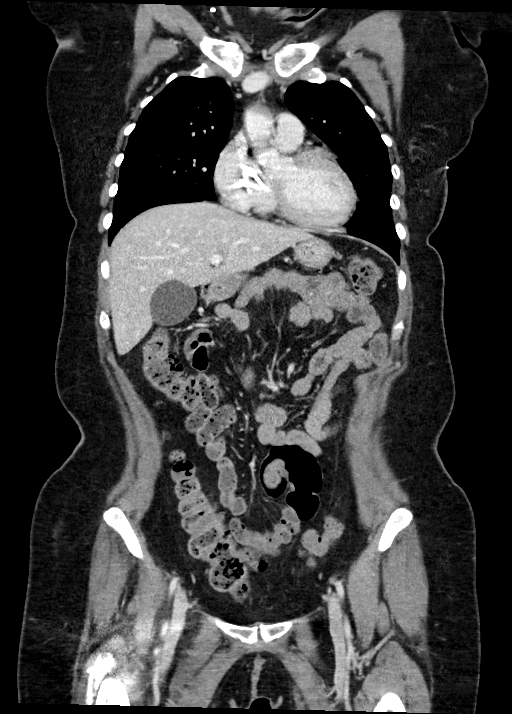
[im 72/120  mediastinal]
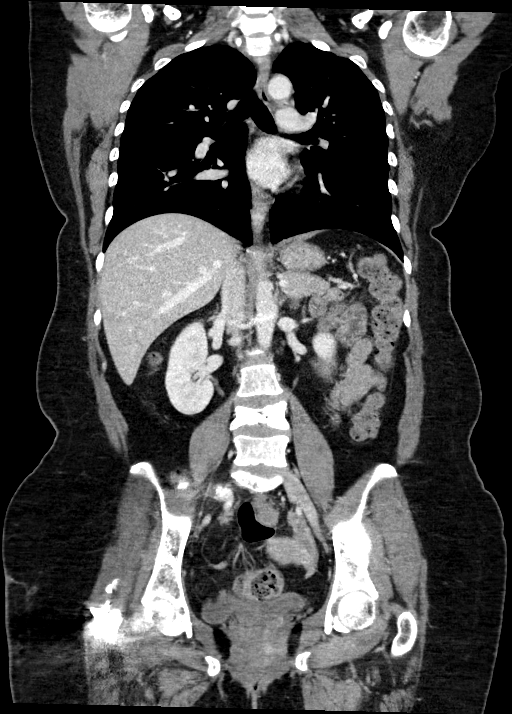

[15 of 36 positions shown; findings below may reference images not displayed]

FINDINGS: CT CHEST FINDINGS

Cardiovascular: Right chest port catheter. Normal heart size. No
pericardial effusion.

Mediastinum/Nodes: No enlarged mediastinal, hilar, or axillary lymph
nodes. Calcified small left hilar lymph nodes. Thyroid gland,
trachea, and esophagus demonstrate no significant findings.

Lungs/Pleura: Unchanged small nodule of the dependent left lower
lobe measuring 4 mm (series 4, image 74). No pleural effusion or
pneumothorax.

Musculoskeletal: No chest wall mass. Biopsy marking clip present in
the left breast.

CT ABDOMEN PELVIS FINDINGS

Hepatobiliary: No solid liver abnormality is seen. No gallstones,
gallbladder wall thickening, or biliary dilatation.

Pancreas: Unremarkable. No pancreatic ductal dilatation or
surrounding inflammatory changes.

Spleen: Normal in size without significant abnormality.

Adrenals/Urinary Tract: Adrenal glands are unremarkable. Kidneys are
normal, without renal calculi, solid lesion, or hydronephrosis.
Bladder is unremarkable.

Stomach/Bowel: Stomach is within normal limits. Appendix appears
normal. No evidence of bowel wall thickening, distention, or
inflammatory changes.

Vascular/Lymphatic: No significant vascular findings are present. No
enlarged abdominal or pelvic lymph nodes.

Reproductive: No mass or other abnormality.

Other: No abdominal wall hernia or abnormality. No abdominopelvic
ascites.

Musculoskeletal: Unchanged sclerotic osseous metastatic lesions
scattered throughout the included axial skeleton. Redemonstrated
kyphoplasty of T6, L1, and L4, with high-grade vertebral plana
deformities of L1 and L4. Additional cement plasty of the right
sacral ala and ilium (series 2, image 79).
IMPRESSION: 1. No evidence of recurrence or new metastatic disease in the chest,
abdomen or pelvis.
2. Stable small pulmonary nodule of the left lower lobe, almost
certainly benign and incidental sequelae of prior infection or
inflammation.
3. Unchanged sclerotic osseous metastatic lesions scattered
throughout the included axial skeleton.

## 2020-10-13 MED ORDER — IOHEXOL 350 MG/ML SOLN
75.0000 mL | Freq: Once | INTRAVENOUS | Status: AC | PRN
  Administered 2020-10-13: 75 mL via INTRAVENOUS

## 2020-10-13 MED ORDER — TECHNETIUM TC 99M MEDRONATE IV KIT
20.0000 | PACK | Freq: Once | INTRAVENOUS | Status: AC | PRN
  Administered 2020-10-13: 21.98 via INTRAVENOUS

## 2020-10-15 ENCOUNTER — Ambulatory Visit: Payer: Self-pay

## 2020-10-15 ENCOUNTER — Inpatient Hospital Stay: Payer: Self-pay

## 2020-10-15 VITALS — BP 119/60 | HR 93 | Temp 97.8°F | Resp 16

## 2020-10-15 DIAGNOSIS — M8440XA Pathological fracture, unspecified site, initial encounter for fracture: Secondary | ICD-10-CM

## 2020-10-15 DIAGNOSIS — Z5111 Encounter for antineoplastic chemotherapy: Secondary | ICD-10-CM

## 2020-10-15 DIAGNOSIS — C50919 Malignant neoplasm of unspecified site of unspecified female breast: Secondary | ICD-10-CM

## 2020-10-15 DIAGNOSIS — C50912 Malignant neoplasm of unspecified site of left female breast: Secondary | ICD-10-CM

## 2020-10-15 LAB — CBC WITH DIFFERENTIAL/PLATELET
Abs Immature Granulocytes: 0.04 10*3/uL (ref 0.00–0.07)
Basophils Absolute: 0 10*3/uL (ref 0.0–0.1)
Basophils Relative: 1 %
Eosinophils Absolute: 0.1 10*3/uL (ref 0.0–0.5)
Eosinophils Relative: 2 %
HCT: 35.3 % — ABNORMAL LOW (ref 36.0–46.0)
Hemoglobin: 11.2 g/dL — ABNORMAL LOW (ref 12.0–15.0)
Immature Granulocytes: 1 %
Lymphocytes Relative: 24 %
Lymphs Abs: 1.6 10*3/uL (ref 0.7–4.0)
MCH: 24.9 pg — ABNORMAL LOW (ref 26.0–34.0)
MCHC: 31.7 g/dL (ref 30.0–36.0)
MCV: 78.4 fL — ABNORMAL LOW (ref 80.0–100.0)
Monocytes Absolute: 0.3 10*3/uL (ref 0.1–1.0)
Monocytes Relative: 5 %
Neutro Abs: 4.3 10*3/uL (ref 1.7–7.7)
Neutrophils Relative %: 67 %
Platelets: 431 10*3/uL — ABNORMAL HIGH (ref 150–400)
RBC: 4.5 MIL/uL (ref 3.87–5.11)
RDW: 19.7 % — ABNORMAL HIGH (ref 11.5–15.5)
WBC: 6.4 10*3/uL (ref 4.0–10.5)
nRBC: 0 % (ref 0.0–0.2)

## 2020-10-15 LAB — COMPREHENSIVE METABOLIC PANEL
ALT: 18 U/L (ref 0–44)
AST: 33 U/L (ref 15–41)
Albumin: 3.8 g/dL (ref 3.5–5.0)
Alkaline Phosphatase: 67 U/L (ref 38–126)
Anion gap: 10 (ref 5–15)
BUN: 11 mg/dL (ref 6–20)
CO2: 22 mmol/L (ref 22–32)
Calcium: 8.6 mg/dL — ABNORMAL LOW (ref 8.9–10.3)
Chloride: 103 mmol/L (ref 98–111)
Creatinine, Ser: 0.58 mg/dL (ref 0.44–1.00)
GFR, Estimated: >60 mL/min (ref >60)
Glucose, Bld: 137 mg/dL — ABNORMAL HIGH (ref 70–99)
Potassium: 3.5 mmol/L (ref 3.5–5.1)
Sodium: 135 mmol/L (ref 135–145)
Total Bilirubin: 0.4 mg/dL (ref 0.3–1.2)
Total Protein: 7.1 g/dL (ref 6.5–8.1)

## 2020-10-15 MED ORDER — SODIUM CHLORIDE 0.9% FLUSH
10.0000 mL | Freq: Once | INTRAVENOUS | Status: AC
  Filled 2020-10-15: qty 10

## 2020-10-15 MED ORDER — FAMOTIDINE 20 MG IN NS 100 ML IVPB
20.0000 mg | Freq: Once | INTRAVENOUS | Status: AC
  Administered 2020-10-15: 20 mg via INTRAVENOUS
  Filled 2020-10-15: qty 20

## 2020-10-15 MED ORDER — SODIUM CHLORIDE 0.9% FLUSH
10.0000 mL | INTRAVENOUS | Status: DC | PRN
  Administered 2020-10-15: 10 mL
  Filled 2020-10-15: qty 10

## 2020-10-15 MED ORDER — ACETAMINOPHEN 325 MG PO TABS
650.0000 mg | ORAL_TABLET | Freq: Once | ORAL | Status: AC
  Administered 2020-10-15: 650 mg via ORAL
  Filled 2020-10-15: qty 2

## 2020-10-15 MED ORDER — HEPARIN SOD (PORK) LOCK FLUSH 100 UNIT/ML IV SOLN
500.0000 [IU] | Freq: Once | INTRAVENOUS | Status: AC | PRN
  Administered 2020-10-15: 500 [IU]
  Filled 2020-10-15: qty 5

## 2020-10-15 MED ORDER — SODIUM CHLORIDE 0.9 % IV SOLN
65.0000 mg/m2 | Freq: Once | INTRAVENOUS | Status: AC
  Administered 2020-10-15: 108 mg via INTRAVENOUS
  Filled 2020-10-15: qty 18

## 2020-10-15 MED ORDER — SODIUM CHLORIDE 0.9 % IV SOLN
20.0000 mg | Freq: Once | INTRAVENOUS | Status: AC
  Administered 2020-10-15: 20 mg via INTRAVENOUS
  Filled 2020-10-15: qty 20

## 2020-10-15 MED ORDER — HEPARIN SOD (PORK) LOCK FLUSH 100 UNIT/ML IV SOLN
500.0000 [IU] | Freq: Once | INTRAVENOUS | Status: AC
  Filled 2020-10-15: qty 5

## 2020-10-15 MED ORDER — SODIUM CHLORIDE 0.9 % IV SOLN
Freq: Once | INTRAVENOUS | Status: AC
  Filled 2020-10-15: qty 250

## 2020-10-15 MED ORDER — HEPARIN SOD (PORK) LOCK FLUSH 100 UNIT/ML IV SOLN
INTRAVENOUS | Status: AC
  Filled 2020-10-15: qty 5

## 2020-10-15 MED ORDER — DIPHENHYDRAMINE HCL 50 MG/ML IJ SOLN
50.0000 mg | Freq: Once | INTRAMUSCULAR | Status: AC
  Administered 2020-10-15: 50 mg via INTRAVENOUS
  Filled 2020-10-15: qty 1

## 2020-10-15 NOTE — Patient Instructions (Signed)
[facility]  Discharge Instructions: Thank you for choosing [facility] to provide your oncology and hematology care.  If you have a lab appointment with the [facility], please go directly to the [facility] and check in at the registration area.  Wear comfortable clothing and clothing appropriate for easy access to any Portacath or PICC line.   We strive to give you quality time with your provider. You may need to reschedule your appointment if you arrive late (15 or more minutes).  Arriving late affects you and other patients whose appointments are after yours.  Also, if you miss three or more appointments without notifying the office, you may be dismissed from the clinic at the provider's discretion.      For prescription refill requests, have your pharmacy contact our office and allow 72 hours for refills to be completed.    Today you received the following chemotherapy and/or immunotherapy agents - paclitaxel      To help prevent nausea and vomiting after your treatment, we encourage you to take your nausea medication as directed.  BELOW ARE SYMPTOMS THAT SHOULD BE REPORTED IMMEDIATELY: *FEVER GREATER THAN 100.4 F (38 C) OR HIGHER *CHILLS OR SWEATING *NAUSEA AND VOMITING THAT IS NOT CONTROLLED WITH YOUR NAUSEA MEDICATION *UNUSUAL SHORTNESS OF BREATH *UNUSUAL BRUISING OR BLEEDING *URINARY PROBLEMS (pain or burning when urinating, or frequent urination) *BOWEL PROBLEMS (unusual diarrhea, constipation, pain near the anus) TENDERNESS IN MOUTH AND THROAT WITH OR WITHOUT PRESENCE OF ULCERS (sore throat, sores in mouth, or a toothache) UNUSUAL RASH, SWELLING OR PAIN  UNUSUAL VAGINAL DISCHARGE OR ITCHING   Items with * indicate a potential emergency and should be followed up as soon as possible or go to the Emergency Department if any problems should occur.  Please show the CHEMOTHERAPY ALERT CARD or IMMUNOTHERAPY ALERT CARD at  check-in to the Emergency Department and triage nurse.  Should you have questions after your visit or need to cancel or reschedule your appointment, please contact [facility]  [phone] and follow the prompts.  Office hours are 8:00 a.m. to 4:30 p.m. Monday - Friday. Please note that voicemails left after 4:00 p.m. may not be returned until the following business day.  We are closed weekends and major holidays. You have access to a nurse at all times for urgent questions. Please call the main number to the clinic [phone] and follow the prompts.  For any non-urgent questions, you may also contact your provider using MyChart. We now offer e-Visits for anyone 36 and older to request care online for non-urgent symptoms. For details visit [email or web address].   Also download the MyChart app! Go to the app store, search "MyChart", open the app, select [facility], and log in with your MyChart username and password.  Due to Covid, a mask is required upon entering the hospital/clinic. If you do not have a mask, one will be given to you upon arrival. For doctor visits, patients may have 1 support person aged 60 or older with them. For treatment visits, patients cannot have anyone with them due to current Covid guidelines and our immunocompromised population.   Paclitaxel injection Qu es este medicamento? El PACLITAXEL es un agente quimioteraputico. Este medicamento acta sobre las clulas que se dividen rpidamente, como las clulas cancerosas, y finalmente provoca la muerte de estas clulas. Se utiliza en el tratamiento del cncer deovario, mama, pulmn, sarcoma de [place] y otros tipos de cncer. Este medicamento  puede ser utilizado para otros usos; si tiene [place] preguntaconsulte con su proveedor de atencin mdica o con su farmacutico. MARCAS COMUNES: Onxol, Taxol Qu le debo informar a mi profesional de la salud antes de tomar  estemedicamento? Necesitan saber si usted presenta alguno de los siguientes problemas osituaciones: antecedentes de frecuencia cardiaca irregular enfermedad heptica recuentos sanguneos bajos, como baja cantidad de glbulos blancos, plaquetas o glbulos rojos enfermedad pulmonar o respiratoria, como asma hormigueo en las manos o los pies, u otro trastorno del sistema nervioso una reaccin alrgica o inusual al paclitaxel, al alcohol, al aceite de ricino polioxietilado, a otros medicamentos quimioteraputicos, a otros medicamentos, alimentos, colorantes o conservantes si est embarazada o buscando quedar embarazada si estamamantando a un beb Cmo debo [employer]? Este medicamento se administra como infusin en una vena. Un profesional de lasalud especialmente capacitado lo administra en un hospital o clnica. Hable con su pediatra para informarse acerca del uso de este medicamento ennios. Puede requerir atencin especial. Sobredosis: Pngase en contacto inmediatamente con un centro toxicolgico o unasala de urgencia si usted cree que haya tomado demasiado medicamento. ATENCIN: [employer] es solo para usted. No comparta este medicamento connadie. Qu sucede si me olvido de una dosis? Es importante no olvidar ninguna dosis. Informe a su mdico o a su profesionalde la salud si no puede asistir a una cita. Qu puede interactuar con este medicamento? No use este medicamento con ninguno de los siguientes frmacos: vacunas de virus vivos Este medicamento tambin puede interactuar con los siguientes frmacos: medicamentos antivirales para la hepatitis, VIH o SIDA ciertos antibiticos, tales como eritromicina y claritromicina ciertos medicamentos para infecciones micticas, tales como itraconazol y ketoconazol ciertos medicamentos para convulsiones, tales como [place], fenobarbital y Museum/gallery curator gemfibrozilnefazodona rifampicina hierba de [place] Puede ser que esta lista no  menciona todas las posibles interacciones. Informe a su profesional de [employer]	[place] de [employer] productos a base de hierbas, medicamentos de [place] o suplementos nutritivos que est tomando. Si usted fuma, consume bebidas alcohlicas o si utiliza drogas ilegales, indqueselo tambin a su profesional de [employer]	[place]. Algunas sustancias pueden interactuar consu medicamento. A qu debo estar atento al usar [employer]? Se supervisar su estado de salud atentamente mientras reciba este medicamento. Tendr que hacerse anlisis de sangre importantes mientras est usando estemedicamento. Este medicamento puede causar Chief of Staff graves. Para reducir su riesgo, necesitar tomar otro(s) medicamento(s) antes del tratamiento con este medicamento. Si tiene Chief of Staff como erupcin cutnea, comezn/picazn o urticaria, hinchazn del rostro, los labios, o la lengua,informe de inmediato a su mdico o profesional de Technical sales engineer. En algunos casos, podra recibir medicamentos adicionales para ayudarlo con losefectos secundarios. Siga todas las instrucciones para usarlos. Este medicamento podra hacerle sentir un Nurse, mental health. Esto es normal, ya que la quimioterapia puede afectar tanto a las clulas sanas como a las clulas cancerosas. Si presenta algn efecto secundario, infrmelo. Contine con el tratamiento aun si se siente enfermo, a menos que su mdico le indique que losuspenda. Llame a su mdico o a su profesional de la salud si tiene fiebre, escalofros o dolor de garganta, o cualquier otro sntoma de resfro o gripe. No se trate usted mismo. Este medicamento reduce la capacidad del cuerpo para combatirinfecciones. Trate de no acercarse a personas que estn enfermas. Este medicamento podra aumentar el riesgo de moretones o sangrado. Consulte asu mdico o a su profesional de la salud si observa sangrados inusuales. Proceda con cuidado al cepillar sus dientes, usar hilo dental o  utilizar palillos  para los dientes, ya que podra contraer una infeccin o Therapist, art con mayor facilidad. Si se somete a algn tratamiento dental, informe a su dentistaque est [employer]. Evite usar productos que contienen aspirina, acetaminofeno, ibuprofeno, naproxeno o ketoprofeno, a menos que as lo indique su mdico. Estos productospueden ocultar la fiebre. No debe quedar embarazada mientras est [employer]. Las mujeres deben informar a su mdico si estn buscando quedar embarazadas o si creen que podran estar embarazadas. Existe la posibilidad de efectos secundarios graves en un beb sin nacer. Para obtener ms informacin, hable con su profesional de la salud o su farmacutico. No debe amamantar a un beb mientras est usandoeste medicamento. Para los hombres, se desaconseja concebir hijos mientras reciben estemedicamento. Este producto podra contener alcohol. Pregunte a Midwife o a su proveedor de atencin de la salud si este medicamento contiene alcohol. Asegrese de decirles a todos los proveedores de atencin de la salud que usted est tomando [place]. Ciertos medicamentos, como metronidazol y disulfiram, pueden causar una reaccin desagradable cuando se usan con alcohol. Esta reaccin incluye enrojecimiento, dolor de cabeza, nuseas, vmitos, sudoracin y aumento de la sed. La reaccin puede durar de 30 minutos a variashoras. Qu efectos secundarios puedo tener al [employer] este medicamento? Efectos secundarios que debe informar a su mdico o a su profesional de lasalud tan pronto como sea posible: Chief of Staff, tales como erupcin cutnea, comezn/picazn o urticaria, e hinchazn de la cara, los labios o la lengua problemas para respirar cambios en la visin frecuencia cardiaca rpida e irregular presin sangunea alta o baja llagas en la boca dolor, hormigueo o entumecimiento de las manos o los pies signos de disminucin en la cantidad de plaquetas o sangrado:  moretones, puntos rojos en la piel, heces de color negro y aspecto alquitranado, sangre en la orina signos de disminucin en la cantidad de glbulos rojos: debilidad o cansancio inusuales, sensacin de Secondary school teacher o aturdimiento, cadas signos de infeccin: fiebre o escalofros, tos, dolor de garganta, dolor o dificultad para orinar signos y sntomas de lesin en el hgado, tales como orina amarilla oscura o [place]; sensacin general de estar enfermo o sntomas gripales; heces claras; prdida del apetito; nuseas; dolor en la regin abdominal superior derecha; debilidad o cansancio inusuales; color amarillento de los ojos o lapiel hinchazn de tobillos, pies, manos frecuencia cardiaca inusualmente lenta Efectos secundarios que generalmente no requieren atencin mdica (infrmelos asu mdico o a su profesional de la salud si persisten o si son molestos): diarrea cada del cabello prdida del apetito dolores musculares o articulares nuseas, vmito dolor, enrojecimiento o Actor de la inyeccincansancio Puede ser que esta lista no menciona todos los posibles efectos secundarios. Comunquese a su mdico por asesoramiento mdico [employer] Usted puede informar los efectos secundarios a la FDA por telfono al1-800-FDA-1088. Dnde debo guardar mi medicina? Este medicamento se administra en hospitales o clnicas, y no necesitarguardarlo en su domicilio. ATENCIN: Este folleto es un resumen. Puede ser que no cubra toda la posible informacin. Si usted tiene preguntas acerca de esta medicina, consulte con sumdico, su farmacutico o su profesional de Technical sales engineer.  2022 Elsevier/Gold Standard (2019-07-10 00:00:00)

## 2020-10-22 ENCOUNTER — Other Ambulatory Visit: Payer: Self-pay

## 2020-10-22 MED ORDER — OXYCODONE HCL ER 10 MG PO T12A: 10.0000 mg | EXTENDED_RELEASE_TABLET | Freq: Two times a day (BID) | ORAL | 0 refills | Status: DC

## 2020-10-22 MED ORDER — OXYCODONE HCL 5 MG PO CAPS: 5.0000 mg | ORAL_CAPSULE | ORAL | 0 refills | Status: DC | PRN

## 2020-10-29 ENCOUNTER — Inpatient Hospital Stay (HOSPITAL_BASED_OUTPATIENT_CLINIC_OR_DEPARTMENT_OTHER): Payer: Self-pay | Admitting: Oncology

## 2020-10-29 ENCOUNTER — Ambulatory Visit: Payer: Self-pay

## 2020-10-29 ENCOUNTER — Inpatient Hospital Stay: Payer: Self-pay

## 2020-10-29 ENCOUNTER — Inpatient Hospital Stay: Payer: Self-pay | Attending: Hospice and Palliative Medicine

## 2020-10-29 ENCOUNTER — Other Ambulatory Visit: Payer: Self-pay | Admitting: *Deleted

## 2020-10-29 VITALS — BP 114/67 | HR 77 | Temp 97.8°F | Wt 136.4 lb

## 2020-10-29 VITALS — BP 100/61 | HR 57 | Resp 16

## 2020-10-29 DIAGNOSIS — C50912 Malignant neoplasm of unspecified site of left female breast: Secondary | ICD-10-CM

## 2020-10-29 DIAGNOSIS — C7951 Secondary malignant neoplasm of bone: Secondary | ICD-10-CM | POA: Insufficient documentation

## 2020-10-29 DIAGNOSIS — C50412 Malignant neoplasm of upper-outer quadrant of left female breast: Secondary | ICD-10-CM | POA: Insufficient documentation

## 2020-10-29 DIAGNOSIS — R519 Headache, unspecified: Secondary | ICD-10-CM | POA: Insufficient documentation

## 2020-10-29 DIAGNOSIS — Z5111 Encounter for antineoplastic chemotherapy: Secondary | ICD-10-CM | POA: Insufficient documentation

## 2020-10-29 DIAGNOSIS — M8440XA Pathological fracture, unspecified site, initial encounter for fracture: Secondary | ICD-10-CM

## 2020-10-29 DIAGNOSIS — G893 Neoplasm related pain (acute) (chronic): Secondary | ICD-10-CM

## 2020-10-29 DIAGNOSIS — D509 Iron deficiency anemia, unspecified: Secondary | ICD-10-CM | POA: Insufficient documentation

## 2020-10-29 DIAGNOSIS — Z5112 Encounter for antineoplastic immunotherapy: Secondary | ICD-10-CM

## 2020-10-29 DIAGNOSIS — C50919 Malignant neoplasm of unspecified site of unspecified female breast: Secondary | ICD-10-CM

## 2020-10-29 DIAGNOSIS — Z171 Estrogen receptor negative status [ER-]: Secondary | ICD-10-CM | POA: Insufficient documentation

## 2020-10-29 LAB — COMPREHENSIVE METABOLIC PANEL
ALT: 21 U/L (ref 0–44)
AST: 32 U/L (ref 15–41)
Albumin: 3.7 g/dL (ref 3.5–5.0)
Alkaline Phosphatase: 62 U/L (ref 38–126)
Anion gap: 8 (ref 5–15)
BUN: 13 mg/dL (ref 6–20)
CO2: 25 mmol/L (ref 22–32)
Calcium: 9 mg/dL (ref 8.9–10.3)
Chloride: 105 mmol/L (ref 98–111)
Creatinine, Ser: 0.49 mg/dL (ref 0.44–1.00)
GFR, Estimated: >60 mL/min (ref >60)
Glucose, Bld: 127 mg/dL — ABNORMAL HIGH (ref 70–99)
Potassium: 3.3 mmol/L — ABNORMAL LOW (ref 3.5–5.1)
Sodium: 138 mmol/L (ref 135–145)
Total Bilirubin: 0.6 mg/dL (ref 0.3–1.2)
Total Protein: 6.9 g/dL (ref 6.5–8.1)

## 2020-10-29 LAB — CBC WITH DIFFERENTIAL/PLATELET
Abs Immature Granulocytes: 0.02 10*3/uL (ref 0.00–0.07)
Basophils Absolute: 0 10*3/uL (ref 0.0–0.1)
Basophils Relative: 1 %
Eosinophils Absolute: 0.1 10*3/uL (ref 0.0–0.5)
Eosinophils Relative: 1 %
HCT: 33.2 % — ABNORMAL LOW (ref 36.0–46.0)
Hemoglobin: 10.6 g/dL — ABNORMAL LOW (ref 12.0–15.0)
Immature Granulocytes: 0 %
Lymphocytes Relative: 27 %
Lymphs Abs: 1.3 10*3/uL (ref 0.7–4.0)
MCH: 25.3 pg — ABNORMAL LOW (ref 26.0–34.0)
MCHC: 31.9 g/dL (ref 30.0–36.0)
MCV: 79.2 fL — ABNORMAL LOW (ref 80.0–100.0)
Monocytes Absolute: 0.4 10*3/uL (ref 0.1–1.0)
Monocytes Relative: 8 %
Neutro Abs: 3 10*3/uL (ref 1.7–7.7)
Neutrophils Relative %: 63 %
Platelets: 406 10*3/uL — ABNORMAL HIGH (ref 150–400)
RBC: 4.19 MIL/uL (ref 3.87–5.11)
RDW: 19 % — ABNORMAL HIGH (ref 11.5–15.5)
WBC: 4.9 10*3/uL (ref 4.0–10.5)
nRBC: 0 % (ref 0.0–0.2)

## 2020-10-29 MED ORDER — SODIUM CHLORIDE 0.9 % IV SOLN
420.0000 mg | Freq: Once | INTRAVENOUS | Status: AC
  Administered 2020-10-29: 420 mg via INTRAVENOUS
  Filled 2020-10-29: qty 14

## 2020-10-29 MED ORDER — SODIUM CHLORIDE 0.9 % IV SOLN
20.0000 mg | Freq: Once | INTRAVENOUS | Status: DC
  Filled 2020-10-29: qty 2

## 2020-10-29 MED ORDER — HEPARIN SOD (PORK) LOCK FLUSH 100 UNIT/ML IV SOLN
500.0000 [IU] | Freq: Once | INTRAVENOUS | Status: AC | PRN
  Administered 2020-10-29: 500 [IU]
  Filled 2020-10-29: qty 5

## 2020-10-29 MED ORDER — DIPHENHYDRAMINE HCL 50 MG/ML IJ SOLN: 50.0000 mg | Freq: Once | INTRAMUSCULAR | Status: DC

## 2020-10-29 MED ORDER — CLINDAMYCIN PHOSPHATE 1 % EX GEL: Freq: Two times a day (BID) | CUTANEOUS | 0 refills | Status: DC

## 2020-10-29 MED ORDER — FAMOTIDINE 20 MG IN NS 100 ML IVPB
20.0000 mg | Freq: Once | INTRAVENOUS | Status: DC
  Filled 2020-10-29: qty 100

## 2020-10-29 MED ORDER — SODIUM CHLORIDE 0.9 % IV SOLN
Freq: Once | INTRAVENOUS | Status: AC
  Filled 2020-10-29: qty 250

## 2020-10-29 MED ORDER — SODIUM CHLORIDE 0.9 % IV SOLN
510.0000 mg | INTRAVENOUS | Status: DC
  Administered 2020-10-29: 510 mg via INTRAVENOUS
  Filled 2020-10-29: qty 510

## 2020-10-29 MED ORDER — TRASTUZUMAB-ANNS CHEMO 150 MG IV SOLR
6.0000 mg/kg | Freq: Once | INTRAVENOUS | Status: AC
  Administered 2020-10-29: 357 mg via INTRAVENOUS
  Filled 2020-10-29: qty 17

## 2020-10-29 MED ORDER — ACETAMINOPHEN 325 MG PO TABS: 650.0000 mg | ORAL_TABLET | Freq: Once | ORAL | Status: AC

## 2020-10-29 MED ORDER — ACETAMINOPHEN 325 MG PO TABS
650.0000 mg | ORAL_TABLET | Freq: Once | ORAL | Status: AC
  Administered 2020-10-29: 650 mg via ORAL
  Filled 2020-10-29: qty 2

## 2020-10-29 NOTE — Patient Instructions (Signed)
[facility]  Discharge Instructions: Thank you for choosing [facility] to provide your oncology and hematology care.  If you have a lab appointment with the [facility], please go directly to the [facility] and check in at the registration area.  Wear comfortable clothing and clothing appropriate for easy access to any Portacath or PICC line.   We strive to give you quality time with your provider. You may need to reschedule your appointment if you arrive late (15 or more minutes).  Arriving late affects you and other patients whose appointments are after yours.  Also, if you miss three or more appointments without notifying the office, you may be dismissed from the clinic at the provider's discretion.      For prescription refill requests, have your pharmacy contact our office and allow 72 hours for refills to be completed.    Today you received the following chemotherapy and/or immunotherapy agents Kanjinti & Perjeta      To help prevent nausea and vomiting after your treatment, we encourage you to take your nausea medication as directed.  BELOW ARE SYMPTOMS THAT SHOULD BE REPORTED IMMEDIATELY: *FEVER GREATER THAN 100.4 F (38 C) OR HIGHER *CHILLS OR SWEATING *NAUSEA AND VOMITING THAT IS NOT CONTROLLED WITH YOUR NAUSEA MEDICATION *UNUSUAL SHORTNESS OF BREATH *UNUSUAL BRUISING OR BLEEDING *URINARY PROBLEMS (pain or burning when urinating, or frequent urination) *BOWEL PROBLEMS (unusual diarrhea, constipation, pain near the anus) TENDERNESS IN MOUTH AND THROAT WITH OR WITHOUT PRESENCE OF ULCERS (sore throat, sores in mouth, or a toothache) UNUSUAL RASH, SWELLING OR PAIN  UNUSUAL VAGINAL DISCHARGE OR ITCHING   Items with * indicate a potential emergency and should be followed up as soon as possible or go to the Emergency Department if any problems should occur.  Please show the CHEMOTHERAPY ALERT CARD or IMMUNOTHERAPY ALERT CARD at  check-in to the Emergency Department and triage nurse.  Should you have questions after your visit or need to cancel or reschedule your appointment, please contact [facility]  [phone] and follow the prompts.  Office hours are 8:00 a.m. to 4:30 p.m. Monday - Friday. Please note that voicemails left after 4:00 p.m. may not be returned until the following business day.  We are closed weekends and major holidays. You have access to a nurse at all times for urgent questions. Please call the main number to the clinic [phone] and follow the prompts.  For any non-urgent questions, you may also contact your provider using MyChart. We now offer e-Visits for anyone 57 and older to request care online for non-urgent symptoms. For details visit [email or web address].   Also download the MyChart app! Go to the app store, search "MyChart", open the app, select [facility], and log in with your MyChart username and password.  Due to Covid, a mask is required upon entering the hospital/clinic. If you do not have a mask, one will be given to you upon arrival. For doctor visits, patients may have 1 support person aged 81 or older with them. For treatment visits, patients cannot have anyone with them due to current Covid guidelines and our immunocompromised population.

## 2020-10-29 NOTE — Progress Notes (Signed)
Patient tolerated first Feraheme treatment well. Patient monitored x 30 minutes post infusion prior to starting chemo regimen.

## 2020-10-30 NOTE — Progress Notes (Signed)
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  History obtained with the help of an interpreter.Patient self palpated left breast mass which was followed by a diagnostic bilateral mammogram.  Mammogram showed 3.1 x 2.9 x 1.9 cm hypoechoic mass at the 1 o'clock position of the left breast.  For abnormal cortically thickened left axillary lymph nodes measuring up to 5 mm.  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  Patient was also having ongoing back pain and was seen by [facility] orthopedics Dr. Doyle Askew who ordered MRI lumbar spine without contrast which showed possible pathologic fractures of L1 and L4 vertebral bodies with greater than 50% height loss at L1 and abnormal signal involving L2-L3 S1 as well as right iliac bone concerning for metastatic disease.  Patient is a single mother of 3 adult children and is very anxious today.  She reports significant back pain which radiates to her bilateral thighs.  Denies any focal tingling numbness or weakness.  Denies any bowel bladder incontinence.  Pain has been uncontrolled despite taking Tylenol.  No prior history of abnormal breast biopsies.  No family history of breast  cancer   PET and MRI showed 3 areas of pathologic fracture of her spine as well as widespread bony metastatic disease and concern for impending fracture of the right hip.  Given her worsening pain she was asked to come to the ER.  She has been evaluated by Dr. Rudene Christians from orthopedic surgery and underwent kyphoplasty at 3 different levels. T6 L1 and L4 along with radiofrequency ablation.    She also underwent prophylactic fixation of the right hip and not affected the sacral region.     Patient received first dose of Herceptin and Perjeta on 09/04/2019.  Baseline echocardiogram normal.she is currently getting taxol/ herceptin/perjeta   Patient admitted to hospital for acute abdominal pain with CT findings concerning for acute colitis.  Colonoscopy showed diffuse severe inflammation with erythema friability and loss of vascularity and shallow ulcerations in the IC valve, ascending colon and cecum.    Interval history- Patient has occasional self limited headaches. Chronic right hip pain. Denies any new complaints today  ECOG PS- 1 Pain scale- 3 Opioid associated constipation- no  Review of systems- Review of Systems  Constitutional:  Negative for chills, fever, malaise/fatigue and weight loss.  HENT:  Negative for congestion, ear discharge and nosebleeds.   Eyes:  Negative for blurred vision.  Respiratory:  Negative for cough, hemoptysis, sputum production, shortness of breath and wheezing.   Cardiovascular:  Negative for chest pain, palpitations, orthopnea and claudication.  Gastrointestinal:  Negative for abdominal pain, blood in stool, constipation, diarrhea, heartburn, melena, nausea and vomiting.  Genitourinary:  Negative for dysuria, flank pain, frequency, hematuria and urgency.  Musculoskeletal:  Negative for back pain, joint pain and myalgias.  Skin:  Negative for rash.  Neurological:  Negative for dizziness, tingling, focal weakness, seizures, weakness and headaches.   Endo/Heme/Allergies:  Does not bruise/bleed easily.  Psychiatric/Behavioral:  Negative for depression and suicidal ideas. The patient does not have insomnia.       No Known Allergies   Past Medical History:  Diagnosis Date   Anxiety    Breast cancer ([place])    with mets   Cancer ([place])    Colitis    COVID-19 in immunocompromised patient ([place])    Family history of colon cancer    Vertigo      Past Surgical History:  Procedure Laterality Date   BREAST BIOPSY Left 08/14/2019   [place] bx of mass, coil marker, path pending   BREAST BIOPSY Left 08/14/2019   [place] bx of LN, hydromarker, path pending   BREAST BIOPSY Left 08/14/2019   affirm bx of calcs, x marker, path pending   ESOPHAGOGASTRODUODENOSCOPY (EGD) WITH PROPOFOL N/A 10/05/2019   Procedure: ESOPHAGOGASTRODUODENOSCOPY (EGD) WITH PROPOFOL;  Surgeon: Lin Landsman, MD;  Location: [place];  Service: Gastroenterology;  Laterality: N/A;   FLEXIBLE SIGMOIDOSCOPY N/A 10/05/2019   Procedure: FLEXIBLE SIGMOIDOSCOPY;  Surgeon: Lin Landsman, MD;  Location: [facility] ENDOSCOPY;  Service: Gastroenterology;  Laterality: N/A;   INTRAMEDULLARY (IM) NAIL INTERTROCHANTERIC Right 09/01/2019   Procedure: INTRAMEDULLARY (IM) NAIL INTERTROCHANTRIC AND RADIOFREQUENCY ABLATION;  Surgeon: Hessie Knows, MD;  Location: [facility] ORS;  Service: Orthopedics;  Laterality: Right;   KYPHOPLASTY N/A 08/29/2019   Procedure: KYPHOPLASTY T6, L1,L4 ,  RADIOFREQUENCY ABLATION;  Surgeon: Hessie Knows, MD;  Location: [facility] ORS;  Service: Orthopedics;  Laterality: N/A;   KYPHOPLASTY Right 09/01/2019   Procedure: Right Sacral Radiofrequency Ablation and Cement Augmentation, Right sacrum and iliac crest;  Surgeon: Hessie Knows, MD;  Location: [facility] ORS;  Service: Orthopedics;  Laterality: Right;   PORTA CATH INSERTION N/A 08/28/2019   Procedure: PORTA CATH INSERTION;  Surgeon: Algernon Huxley, MD;  Location: [place] CV LAB;  Service: Cardiovascular;  Laterality: N/A;     Social History   Socioeconomic History   Marital status: Single    Spouse name: Not on file   Number of children: Not on file   Years of education: Not on file   Highest education level: Not on file  Occupational History   Not on file  Tobacco Use   Smoking status: Never   Smokeless tobacco: Never  Vaping Use   Vaping Use: Never used  Substance and Sexual Activity   Alcohol use: Not Currently   Drug use: Not Currently   Sexual activity: Not Currently    Birth control/protection: None  Other Topics Concern   Not on file  Social History Narrative   Lives at home with children   Social Determinants of Health   Financial Resource Strain: Not on file  Food Insecurity: Not on file  Transportation Needs: Not on file  Physical Activity: Not on file  Stress: Not on file  Social Connections: Not on file  Intimate Partner Violence: Not on file    Family History  Problem Relation Age of Onset   Colon cancer Maternal Uncle      Current Outpatient Medications:    albuterol (VENTOLIN HFA) 108 (90 Base) MCG/ACT inhaler, Inhale 2 puffs into the lungs every 6 (six) hours as needed for wheezing or shortness of breath., Disp: 8 g, Rfl: 2   calcium-vitamin D (OSCAL WITH D) 500-200  MG-UNIT tablet, Take 1 tablet by mouth 2 (two) times daily., Disp: 30 tablet, Rfl: 0   diphenoxylate-atropine (LOMOTIL) 2.5-0.025 MG tablet, Take 1 tablet by mouth 4 (four) times daily as needed for diarrhea or loose stools., Disp: 40 tablet, Rfl: 0   ergocalciferol (VITAMIN D2) 1.25 MG (50000 UT) capsule, Take 1 capsule (50,000 Units total) by mouth once a week. For 4 weeks. Then 1 capsule once monthly for 4 months, Disp: 8 capsule, Rfl: 0   gabapentin (NEURONTIN) 600 MG tablet, Take 1 tablet (600 mg total) by mouth 3 (three) times daily., Disp: 90 tablet, Rfl: 2   lidocaine-prilocaine (EMLA) cream, Apply 1 application topically as needed. Apply small amount to port site at least 1 hour prior to it being  accessed, cover with plastic wrap, Disp: 30 g, Rfl: 1   ondansetron (ZOFRAN) 8 MG tablet, Take 1 tablet (8 mg total) by mouth every 8 (eight) hours as needed for nausea or vomiting., Disp: 45 tablet, Rfl: 2   oxycodone (OXY-IR) 5 MG capsule, Take 1-2 capsules (5-10 mg total) by mouth every 4 (four) hours as needed., Disp: 90 capsule, Rfl: 0   oxyCODONE (OXYCONTIN) 10 mg 12 hr tablet, Take 1 tablet (10 mg total) by mouth every 12 (twelve) hours., Disp: 60 tablet, Rfl: 0   potassium chloride SA (KLOR-CON) 20 MEQ tablet, Take 1 tablet (20 mEq total) by mouth daily for 7 days., Disp: 7 tablet, Rfl: 0   prochlorperazine (COMPAZINE) 10 MG tablet, Take 1 tablet (10 mg total) by mouth every 6 (six) hours as needed for nausea or vomiting., Disp: 45 tablet, Rfl: 2   clindamycin (CLINDAGEL) 1 % gel, Apply topically 2 (two) times daily. X 7 days., Disp: 30 g, Rfl: 0   cyclobenzaprine (FLEXERIL) 5 MG tablet, Take 1 tablet (5 mg total) by mouth 3 (three) times daily as needed for muscle spasms. (Patient not taking: No sig reported), Disp: 30 tablet, Rfl: 0 No current facility-administered medications for this visit.  Facility-Administered Medications Ordered in Other Visits:    0.9 %  sodium chloride infusion, , Intravenous, Continuous, Burns, Renda Rolls, NP, Stopped at 06/02/20 1453   heparin lock flush 100 unit/mL, 500 Units, Intravenous, Once, Creig Hines, MD   sodium chloride flush (NS) 0.9 % injection 10 mL, 10 mL, Intravenous, Once, Creig Hines, MD  Physical exam:  Vitals:   10/29/20 1150  BP: 114/67  Pulse: 77  Temp: 97.8 F (36.6 C)  TempSrc: Tympanic  SpO2: 100%  Weight: 136 lb 6.4 oz (61.9 kg)   Physical Exam   CMP Latest Ref Rng & Units 10/29/2020  Glucose 70 - 99 mg/dL 294(C)  BUN 6 - 20 mg/dL 13  Creatinine 4.99 - 8.48 mg/dL 9.24  Sodium 497 - 458 mmol/L 138  Potassium 3.5 - 5.1 mmol/L 3.3(L)  Chloride 98 - 111 mmol/L 105  CO2 22 - 32 mmol/L 25  Calcium 8.9 - 10.3 mg/dL 9.0   Total Protein 6.5 - 8.1 g/dL 6.9  Total Bilirubin 0.3 - 1.2 mg/dL 0.6  Alkaline Phos 38 - 126 U/L 62  AST 15 - 41 U/L 32  ALT 0 - 44 U/L 21   CBC Latest Ref Rng & Units 10/29/2020  WBC 4.0 - 10.5 K/uL 4.9  Hemoglobin 12.0 - 15.0 g/dL 10.6(L)  Hematocrit 36.0 - 46.0 % 33.2(L)  Platelets 150 - 400 K/uL 406(H)    No images are attached to the encounter.  NM Bone Scan Whole Body  Result Date: 10/15/2020 CLINICAL DATA:  History of breast cancer. Assess treatment response. EXAM: NUCLEAR MEDICINE WHOLE BODY BONE SCAN TECHNIQUE: Whole body anterior and posterior images were obtained approximately 3 hours after intravenous injection of radiopharmaceutical. RADIOPHARMACEUTICALS:  21.98 mCi Technetium-42m MDP IV COMPARISON:  06/30/2020 FINDINGS: Previously noted lesion involving the left occipital bone as decreased in FDG uptake compared with previous exam. Similar appearance of asymmetric increased radiotracer uptake localizing to the left iliac bone. Unchanged radiotracer activity localizing to the right posterior second rib. Increased radiotracer uptake localizing to the anterior aspect of the left third rib corresponds to a focal healing fracture as demonstrated on previous imaging. Intense radiotracer uptake surrounding patient's proximal right hip is identified where there are signs a pathologic fracture and an IM nail and screw device has been placed. Less intense uptake extends along the shaft of the IM nail into the distal femur. When compared with the previous bone scan the degree of FDG uptake appears similar. IMPRESSION: 1. No clear progression osseous metastatic disease. 2. Improved appearance left occipital bone metastases. 3. Persistent increased uptake within the proximal right femur and extending along the shaft right IM nail is identified. Electronically Signed   By: Kerby Moors M.D.   On: 10/15/2020 15:00   CT CHEST ABDOMEN PELVIS W CONTRAST  Result Date: 10/14/2020 CLINICAL DATA:   Metastatic left breast cancer EXAM: CT CHEST, ABDOMEN, AND PELVIS WITH CONTRAST TECHNIQUE: Multidetector CT imaging of the chest, abdomen and pelvis was performed following the standard protocol during bolus administration of intravenous contrast. CONTRAST:  5mL OMNIPAQUE IOHEXOL 350 MG/ML SOLN COMPARISON:  CT abdomen pelvis, 06/03/2020, CT chest abdomen pelvis, 04/01/2020 FINDINGS: CT CHEST FINDINGS Cardiovascular: Right chest port catheter. Normal heart size. No pericardial effusion. Mediastinum/Nodes: No enlarged mediastinal, hilar, or axillary lymph nodes. Calcified small left hilar lymph nodes. Thyroid gland, trachea, and esophagus demonstrate no significant findings. Lungs/Pleura: Unchanged small nodule of the dependent left lower lobe measuring 4 mm (series 4, image 74). No pleural effusion or pneumothorax. Musculoskeletal: No chest wall mass. Biopsy marking clip present in the left breast. CT ABDOMEN PELVIS FINDINGS Hepatobiliary: No solid liver abnormality is seen. No gallstones, gallbladder wall thickening, or biliary dilatation. Pancreas: Unremarkable. No pancreatic ductal dilatation or surrounding inflammatory changes. Spleen: Normal in size without significant abnormality. Adrenals/Urinary Tract: Adrenal glands are unremarkable. Kidneys are normal, without renal calculi, solid lesion, or hydronephrosis. Bladder is unremarkable. Stomach/Bowel: Stomach is within normal limits. Appendix appears normal. No evidence of bowel wall thickening, distention, or inflammatory changes. Vascular/Lymphatic: No significant vascular findings are present. No enlarged abdominal or pelvic lymph nodes. Reproductive: No mass or other abnormality. Other: No abdominal wall hernia or abnormality. No abdominopelvic ascites. Musculoskeletal: Unchanged sclerotic osseous metastatic lesions scattered throughout the included axial skeleton. Redemonstrated kyphoplasty of T6, L1, and L4, with high-grade vertebral plana deformities of  L1 and L4. Additional cement plasty of the right sacral ala and ilium (series 2, image 79). IMPRESSION: 1. No evidence of recurrence or new metastatic disease in the chest, abdomen or pelvis. 2. Stable small pulmonary nodule of the left lower lobe, almost certainly benign and incidental sequelae of prior infection or inflammation. 3. Unchanged sclerotic osseous metastatic lesions scattered throughout the included axial skeleton. Electronically Signed   By: Eddie Candle M.D.   On: 10/14/2020 15:10   ECHOCARDIOGRAM COMPLETE  Result Date: 10/04/2020    ECHOCARDIOGRAM REPORT   Patient Name:   LATRISA HELLUMS [facility] Date of Exam: 10/04/2020 Medical Rec #:  [id number]  Height:       60.0 in Accession #:    [id number]            Weight:       136.4 lb Date of Birth:  06/13/1980             BSA:          1.586 m Patient Age:    45 years              BP:           113/76 mmHg Patient Gender: F                     HR:           78 bpm. Exam Location:  [facility] Procedure: 2D Echo, Color Doppler, Cardiac Doppler and Strain Analysis Indications:     Chemo Z09  History:         Patient has prior history of Echocardiogram examinations, most                  recent 06/24/2020. Cancer, anxiety.  Sonographer:     Sherrie Sport RDCS (AE) Referring Phys:  [id number] Weston Anna Chasya Zenz Diagnosing Phys: Ida Rogue MD  Sonographer Comments: Global longitudinal strain was attempted. IMPRESSIONS  1. Left ventricular ejection fraction, by estimation, is 60 to 65%. The left ventricle has normal function. The left ventricle has no regional wall motion abnormalities. Indeterminate diastolic filling due to E-A fusion.Global longitudinal strain performed but not reported based on interpreter judgement due to suboptimal tracking.  2. Right ventricular systolic function is normal. The right ventricular size is normal. There is mildly elevated pulmonary artery systolic pressure. The estimated right ventricular systolic pressure is 65.7 mmHg. FINDINGS   Left Ventricle: Left ventricular ejection fraction, by estimation, is 60 to 65%. The left ventricle has normal function. The left ventricle has no regional wall motion abnormalities. Global longitudinal strain performed but not reported based on interpreter judgement due to suboptimal tracking. The left ventricular internal cavity size was normal in size. There is no left ventricular hypertrophy. Indeterminate diastolic filling due to E-A fusion. Right Ventricle: The right ventricular size is normal. No increase in right ventricular wall thickness. Right ventricular systolic function is normal. There is mildly elevated pulmonary artery systolic pressure. The tricuspid regurgitant velocity is 2.88  m/s, and with an assumed right atrial pressure of 5 mmHg, the estimated right ventricular systolic pressure is 84.6 mmHg. Left Atrium: Left atrial size was normal in size. Right Atrium: Right atrial size was normal in size. Pericardium: There is no evidence of pericardial effusion. Mitral Valve: The mitral valve is normal in structure. No evidence of mitral valve regurgitation. No evidence of mitral valve stenosis. Tricuspid Valve: The tricuspid valve is normal in structure. Tricuspid valve regurgitation is not demonstrated. No evidence of tricuspid stenosis. Aortic Valve: The aortic valve is normal in structure. Aortic valve regurgitation is not visualized. No aortic stenosis is present. Aortic valve mean gradient measures 3.5 mmHg. Aortic valve peak gradient measures 6.5 mmHg. Aortic valve area, by VTI measures 2.09 cm. Pulmonic Valve: The pulmonic valve was normal in structure. Pulmonic valve regurgitation is not visualized. No evidence of pulmonic stenosis. Aorta: The aortic root is normal in size and structure. Venous: The inferior vena cava is normal in size with greater than 50% respiratory variability, suggesting right atrial pressure of 3 mmHg. IAS/Shunts: No atrial level shunt detected by color flow Doppler.  LEFT VENTRICLE PLAX 2D LVIDd:         4.59 cm  Diastology LVIDs:         2.97 cm  LV e' medial:    7.07 cm/s LV PW:         0.70 cm  LV E/e' medial:  9.4 LV IVS:        0.84 cm  LV e' lateral:   14.90 cm/s LVOT diam:     1.80 cm  LV E/e' lateral: 4.5 LV SV:         38 LV SV Index:   24       2D Longitudinal Strain LVOT Area:     2.54 cm 2D Strain GLS Avg:     -12.5 %  RIGHT VENTRICLE RV S prime:     13.90 cm/s TAPSE (M-mode): 2.8 cm LEFT ATRIUM             Index       RIGHT ATRIUM           Index LA diam:        3.10 cm 1.95 cm/m  RA Area:     15.70 cm LA Vol (A2C):   27.0 ml 17.02 ml/m RA Volume:   40.10 ml  25.28 ml/m LA Vol (A4C):   25.6 ml 16.14 ml/m LA Biplane Vol: 27.9 ml 17.59 ml/m  AORTIC VALVE                   PULMONIC VALVE AV Area (Vmax):    1.62 cm    PV Vmax:        0.83 m/s AV Area (Vmean):   1.76 cm    PV Peak grad:   2.7 mmHg AV Area (VTI):     2.09 cm    RVOT Peak grad: 4 mmHg AV Vmax:           127.00 cm/s AV Vmean:          85.650 cm/s AV VTI:            0.182 m AV Peak Grad:      6.5 mmHg AV Mean Grad:      3.5 mmHg LVOT Vmax:         80.90 cm/s LVOT Vmean:        59.200 cm/s LVOT VTI:          0.150 m LVOT/AV VTI ratio: 0.82  AORTA Ao Root diam: 2.70 cm MITRAL VALVE               TRICUSPID VALVE MV Area (PHT): 8.25 cm    TR Peak grad:   33.2 mmHg MV Decel Time: 92 msec     TR Vmax:        288.00 cm/s MV E velocity: 66.40 cm/s MV A velocity: 89.50 cm/s  SHUNTS MV E/A ratio:  0.74        Systemic VTI:  0.15 m                            Systemic Diam: 1.80 cm Julien Nordmann MD Electronically signed by Julien Nordmann MD Signature Date/Time: 10/04/2020/3:55:05 PM    Final      Assessment and plan- Patient is a 40 y.o. female with metastatic ER/PR negative HER-2 positive breast cancer with bone metastases.   Patient has been on Taxol Herceptin and Perjeta since June 2021 which she has tolerated relatively well.  No significant neuropathy from Taxol.  Most recent CT chest abdomen and  pelvis with contrast as well as bone scan in July 2022 showed overall stable bony metastatic disease with no evidence of progression.  I will therefore plan to stop Taxol at this time and she will continue to receive Herceptin and Perjeta alone every 3 weeks until progression or toxicity.  Most recent echocardiogram in July 2022 showed normal EF of 60 to 65%.  Patient has had occasional headaches for the last 1 year.  We have obtain MRI brain in September 2021 as well which did not show any evidence of intracranial metastatic disease.  No change in her symptoms since then.  If there is any progressive worsening of her headaches or new neurological signs and symptoms I will have a low threshold to get an MRI brain.  Bone metastases: She will receive Xgeva at next visit.  We will plan to give Xgeva every 6 weeks to coincide with her Herceptin and Perjeta.    Neoplasm related pain: Continue as needed oxycodone and OxyContin.  She will also receive Feraheme today for her iron deficiency anemia.   Visit Diagnosis 1. Encounter for monoclonal antibody treatment for malignancy   2. Primary cancer of left breast with metastasis to other site [facility])      Dr. Randa Evens, MD, MPH [facility] at [facility] [id number] 10/30/2020 6:23 PM

## 2020-10-31 ENCOUNTER — Encounter: Payer: Self-pay | Admitting: Oncology

## 2020-11-19 ENCOUNTER — Encounter: Payer: Self-pay | Admitting: Oncology

## 2020-11-19 ENCOUNTER — Inpatient Hospital Stay (HOSPITAL_BASED_OUTPATIENT_CLINIC_OR_DEPARTMENT_OTHER): Payer: Self-pay | Admitting: Oncology

## 2020-11-19 ENCOUNTER — Inpatient Hospital Stay: Payer: Self-pay

## 2020-11-19 ENCOUNTER — Inpatient Hospital Stay: Payer: Self-pay | Attending: Hospice and Palliative Medicine

## 2020-11-19 VITALS — BP 107/66 | HR 82 | Temp 97.9°F | Resp 16 | Wt 137.5 lb

## 2020-11-19 VITALS — BP 113/73 | HR 85

## 2020-11-19 DIAGNOSIS — Z5111 Encounter for antineoplastic chemotherapy: Secondary | ICD-10-CM

## 2020-11-19 DIAGNOSIS — C50919 Malignant neoplasm of unspecified site of unspecified female breast: Secondary | ICD-10-CM

## 2020-11-19 DIAGNOSIS — G893 Neoplasm related pain (acute) (chronic): Secondary | ICD-10-CM | POA: Insufficient documentation

## 2020-11-19 DIAGNOSIS — M8440XA Pathological fracture, unspecified site, initial encounter for fracture: Secondary | ICD-10-CM

## 2020-11-19 DIAGNOSIS — C7951 Secondary malignant neoplasm of bone: Secondary | ICD-10-CM

## 2020-11-19 DIAGNOSIS — C50912 Malignant neoplasm of unspecified site of left female breast: Secondary | ICD-10-CM

## 2020-11-19 DIAGNOSIS — R350 Frequency of micturition: Secondary | ICD-10-CM | POA: Insufficient documentation

## 2020-11-19 DIAGNOSIS — R300 Dysuria: Secondary | ICD-10-CM | POA: Insufficient documentation

## 2020-11-19 DIAGNOSIS — R519 Headache, unspecified: Secondary | ICD-10-CM | POA: Insufficient documentation

## 2020-11-19 DIAGNOSIS — Z79899 Other long term (current) drug therapy: Secondary | ICD-10-CM

## 2020-11-19 DIAGNOSIS — Z171 Estrogen receptor negative status [ER-]: Secondary | ICD-10-CM | POA: Insufficient documentation

## 2020-11-19 DIAGNOSIS — Z5181 Encounter for therapeutic drug level monitoring: Secondary | ICD-10-CM

## 2020-11-19 DIAGNOSIS — R202 Paresthesia of skin: Secondary | ICD-10-CM | POA: Insufficient documentation

## 2020-11-19 DIAGNOSIS — Z5112 Encounter for antineoplastic immunotherapy: Secondary | ICD-10-CM | POA: Insufficient documentation

## 2020-11-19 DIAGNOSIS — Z95828 Presence of other vascular implants and grafts: Secondary | ICD-10-CM

## 2020-11-19 DIAGNOSIS — R200 Anesthesia of skin: Secondary | ICD-10-CM | POA: Insufficient documentation

## 2020-11-19 DIAGNOSIS — C50412 Malignant neoplasm of upper-outer quadrant of left female breast: Secondary | ICD-10-CM | POA: Insufficient documentation

## 2020-11-19 LAB — COMPREHENSIVE METABOLIC PANEL
ALT: 22 U/L (ref 0–44)
AST: 33 U/L (ref 15–41)
Albumin: 3.7 g/dL (ref 3.5–5.0)
Alkaline Phosphatase: 80 U/L (ref 38–126)
Anion gap: 9 (ref 5–15)
BUN: 9 mg/dL (ref 6–20)
CO2: 23 mmol/L (ref 22–32)
Calcium: 8.6 mg/dL — ABNORMAL LOW (ref 8.9–10.3)
Chloride: 104 mmol/L (ref 98–111)
Creatinine, Ser: 0.57 mg/dL (ref 0.44–1.00)
GFR, Estimated: >60 mL/min (ref >60)
Glucose, Bld: 107 mg/dL — ABNORMAL HIGH (ref 70–99)
Potassium: 3.6 mmol/L (ref 3.5–5.1)
Sodium: 136 mmol/L (ref 135–145)
Total Bilirubin: 0.6 mg/dL (ref 0.3–1.2)
Total Protein: 7.1 g/dL (ref 6.5–8.1)

## 2020-11-19 LAB — CBC WITH DIFFERENTIAL/PLATELET
Abs Immature Granulocytes: 0.02 10*3/uL (ref 0.00–0.07)
Basophils Absolute: 0 10*3/uL (ref 0.0–0.1)
Basophils Relative: 0 %
Eosinophils Absolute: 0.2 10*3/uL (ref 0.0–0.5)
Eosinophils Relative: 4 %
HCT: 36 % (ref 36.0–46.0)
Hemoglobin: 11.7 g/dL — ABNORMAL LOW (ref 12.0–15.0)
Immature Granulocytes: 0 %
Lymphocytes Relative: 29 %
Lymphs Abs: 1.4 10*3/uL (ref 0.7–4.0)
MCH: 26.8 pg (ref 26.0–34.0)
MCHC: 32.5 g/dL (ref 30.0–36.0)
MCV: 82.6 fL (ref 80.0–100.0)
Monocytes Absolute: 0.4 10*3/uL (ref 0.1–1.0)
Monocytes Relative: 8 %
Neutro Abs: 2.8 10*3/uL (ref 1.7–7.7)
Neutrophils Relative %: 59 %
Platelets: 338 10*3/uL (ref 150–400)
RBC: 4.36 MIL/uL (ref 3.87–5.11)
RDW: 20.4 % — ABNORMAL HIGH (ref 11.5–15.5)
WBC: 4.8 10*3/uL (ref 4.0–10.5)
nRBC: 0 % (ref 0.0–0.2)

## 2020-11-19 MED ORDER — TRASTUZUMAB-ANNS CHEMO 150 MG IV SOLR
6.0000 mg/kg | Freq: Once | INTRAVENOUS | Status: AC
  Administered 2020-11-19: 357 mg via INTRAVENOUS
  Filled 2020-11-19: qty 17

## 2020-11-19 MED ORDER — DENOSUMAB 120 MG/1.7ML ~~LOC~~ SOLN
120.0000 mg | Freq: Once | SUBCUTANEOUS | Status: AC
  Administered 2020-11-19: 120 mg via SUBCUTANEOUS
  Filled 2020-11-19: qty 1.7

## 2020-11-19 MED ORDER — SODIUM CHLORIDE 0.9% FLUSH
10.0000 mL | Freq: Once | INTRAVENOUS | Status: AC
  Filled 2020-11-19: qty 10

## 2020-11-19 MED ORDER — HEPARIN SOD (PORK) LOCK FLUSH 100 UNIT/ML IV SOLN
500.0000 [IU] | Freq: Once | INTRAVENOUS | Status: AC | PRN
  Administered 2020-11-19: 500 [IU]
  Filled 2020-11-19: qty 5

## 2020-11-19 MED ORDER — SODIUM CHLORIDE 0.9 % IV SOLN
20.0000 mg | Freq: Once | INTRAVENOUS | Status: AC
  Administered 2020-11-19: 20 mg via INTRAVENOUS
  Filled 2020-11-19: qty 20

## 2020-11-19 MED ORDER — SODIUM CHLORIDE 0.9 % IV SOLN
Freq: Once | INTRAVENOUS | Status: AC
  Filled 2020-11-19: qty 250

## 2020-11-19 MED ORDER — DIPHENHYDRAMINE HCL 50 MG/ML IJ SOLN
50.0000 mg | Freq: Once | INTRAMUSCULAR | Status: AC
  Administered 2020-11-19: 50 mg via INTRAVENOUS
  Filled 2020-11-19: qty 1

## 2020-11-19 MED ORDER — HEPARIN SOD (PORK) LOCK FLUSH 100 UNIT/ML IV SOLN
INTRAVENOUS | Status: AC
  Filled 2020-11-19: qty 5

## 2020-11-19 MED ORDER — HEPARIN SOD (PORK) LOCK FLUSH 100 UNIT/ML IV SOLN
500.0000 [IU] | Freq: Once | INTRAVENOUS | Status: AC
  Filled 2020-11-19: qty 5

## 2020-11-19 MED ORDER — ACETAMINOPHEN 325 MG PO TABS
650.0000 mg | ORAL_TABLET | Freq: Once | ORAL | Status: AC
  Administered 2020-11-19: 650 mg via ORAL
  Filled 2020-11-19: qty 2

## 2020-11-19 MED ORDER — FAMOTIDINE 20 MG IN NS 100 ML IVPB
20.0000 mg | Freq: Once | INTRAVENOUS | Status: AC
Start: 2020-11-19 — End: 2020-11-19
  Administered 2020-11-19: 20 mg via INTRAVENOUS
  Filled 2020-11-19: qty 20

## 2020-11-19 MED ORDER — PERTUZUMAB CHEMO INJECTION 420 MG/14ML
420.0000 mg | Freq: Once | INTRAVENOUS | Status: AC
Start: 2020-11-19 — End: 2020-11-19
  Administered 2020-11-19: 420 mg via INTRAVENOUS
  Filled 2020-11-19: qty 14

## 2020-11-19 NOTE — Patient Instructions (Signed)
[facility] C[facility] [place] [facility]  Discharge Instructions: Thank you for choosing [facility] to provide your oncology and hematology care.  If you have a lab appointment with the Cancer Center, please go directly to the Cancer Center and check in at the registration area.  Wear comfortable clothing and clothing appropriate for easy access to any Portacath or PICC line.   We strive to give you quality time with your provider. You may need to reschedule your appointment if you arrive late (15 or more minutes).  Arriving late affects you and other patients whose appointments are after yours.  Also, if you miss three or more appointments without notifying the office, you may be dismissed from the clinic at the provider's discretion.      For prescription refill requests, have your pharmacy contact our office and allow 72 hours for refills to be completed.    Today you received the following chemotherapy and/or immunotherapy agents       To help prevent nausea and vomiting after your treatment, we encourage you to take your nausea medication as directed.  BELOW ARE SYMPTOMS THAT SHOULD BE REPORTED IMMEDIATELY: *FEVER GREATER THAN 100.4 F (38 C) OR HIGHER *CHILLS OR SWEATING *NAUSEA AND VOMITING THAT IS NOT CONTROLLED WITH YOUR NAUSEA MEDICATION *UNUSUAL SHORTNESS OF BREATH *UNUSUAL BRUISING OR BLEEDING *URINARY PROBLEMS (pain or burning when urinating, or frequent urination) *BOWEL PROBLEMS (unusual diarrhea, constipation, pain near the anus) TENDERNESS IN MOUTH AND THROAT WITH OR WITHOUT PRESENCE OF ULCERS (sore throat, sores in mouth, or a toothache) UNUSUAL RASH, SWELLING OR PAIN  UNUSUAL VAGINAL DISCHARGE OR ITCHING   Items with * indicate a potential emergency and should be followed up as soon as possible or go to the Emergency Department if any problems should occur.  Please show the CHEMOTHERAPY ALERT CARD or IMMUNOTHERAPY ALERT CARD at check-in to the  Emergency Department and triage nurse.  Should you have questions after your visit or need to cancel or reschedule your appointment, please contact [facility] [place] [facility]  [phone] and follow the prompts.  Office hours are 8:00 a.m. to 4:30 p.m. Monday - Friday. Please note that voicemails left after 4:00 p.m. may not be returned until the following business day.  We are closed weekends and major holidays. You have access to a nurse at all times for urgent questions. Please call the main number to the clinic [phone] and follow the prompts.  For any non-urgent questions, you may also contact your provider using MyChart. We now offer e-Visits for anyone 18 and older to request care online for non-urgent symptoms. For details visit [email or web address].   Also download the MyChart app! Go to the app store, search "MyChart", open the app, select [place] [facility], and log in with your MyChart username and password.  Due to Covid, a mask is required upon entering the hospital/clinic. If you do not have a mask, one will be given to you upon arrival. For doctor visits, patients may have 1 support person aged 18 or older with them. For treatment visits, patients cannot have anyone with them due to current Covid guidelines and our immunocompromised population.  

## 2020-11-19 NOTE — Progress Notes (Signed)
Pt states that she has been dealing with headaches recently, they tend to come and go. Takes tylenol.

## 2020-11-19 NOTE — Progress Notes (Signed)
Hematology/Oncology Consult note [facility]  Telephone:([phone] [phone]  Patient Care Team: [facility], [place], NP as PCP - General Rico Junker, RN as Registered Nurse Theodore Demark, RN as Registered Nurse Sindy Guadeloupe, MD as Consulting Physician (Hematology and Oncology)   Name of the patient: Sophia [name]  [id number]  May 02, 1980   Date of visit: 11/19/20  Diagnosis- stage IV metastatic breast cancer ER/PR negative HER-2/neu positive with bone metastases    Chief complaint/ Reason for visit-on treatment assessment prior to cycle 20 of Herceptin and Perjeta  Heme/Onc history: patient is a 40 year old Hispanic female who is here with her friend.  History obtained with the help of an interpreter.Patient self palpated left breast mass which was followed by a diagnostic bilateral mammogram.  Mammogram showed 3.1 x 2.9 x 1.9 cm hypoechoic mass at the 1 o'clock position of the left breast.  For abnormal cortically thickened left axillary lymph nodes measuring up to 5 mm.  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  Patient was also having ongoing back pain and was seen by [facility] orthopedics Dr. Doyle Askew who ordered MRI lumbar spine without contrast which showed possible pathologic fractures of L1 and L4 vertebral bodies with greater than 50% height loss at L1 and abnormal signal involving L2-L3 S1 as well as right iliac bone concerning for metastatic disease.  Patient is a single mother of 3 adult children and is very anxious today.  She reports significant back pain which radiates to her bilateral thighs.  Denies any focal tingling numbness or weakness.  Denies any bowel bladder incontinence.  Pain has been uncontrolled despite taking Tylenol.  No prior history of abnormal breast biopsies.  No family history of breast  cancer   PET and MRI showed 3 areas of pathologic fracture of her spine as well as widespread bony metastatic disease and concern for impending fracture of the right hip.  Given her worsening pain she was asked to come to the ER.  She has been evaluated by Dr. Rudene Christians from orthopedic surgery and underwent kyphoplasty at 3 different levels. T6 L1 and L4 along with radiofrequency ablation.    She also underwent prophylactic fixation of the right hip and not affected the sacral region.     Patient received first dose of Herceptin and Perjeta on 09/04/2019.  Baseline echocardiogram normal.she is currently getting taxol/ herceptin/perjeta   Patient admitted to hospital for acute abdominal pain with CT findings concerning for acute colitis.  Colonoscopy showed diffuse severe inflammation with erythema friability and loss of vascularity and shallow ulcerations in the IC valve, ascending colon and cecum.  Interval history-patient has occasional headaches which have been chronic.  Denies any new complaints at this time.  She also has chronic right hip pain which is well controlled with pain medications.  She had an episode of conjunctivitis about 2 weeks ago which gradually resolved.  She has a small area under her right eyelid which is occasionally itchy.  ECOG PS- 1 Pain scale- 2 Opioid associated constipation- no  Review of systems- Review of Systems  Constitutional:  Positive for malaise/fatigue. Negative for chills, fever and weight loss.  HENT:  Negative for congestion, ear discharge and nosebleeds.   Eyes:  Negative for blurred vision.  Respiratory:  Negative for cough, hemoptysis, sputum production, shortness of breath and  wheezing.   Cardiovascular:  Negative for chest pain, palpitations, orthopnea and claudication.  Gastrointestinal:  Negative for abdominal pain, blood in stool, constipation, diarrhea, heartburn, melena, nausea and vomiting.  Genitourinary:  Negative for dysuria, flank pain,  frequency, hematuria and urgency.  Musculoskeletal:  Negative for back pain, joint pain and myalgias.  Skin:  Negative for rash.  Neurological:  Negative for dizziness, tingling, focal weakness, seizures, weakness and headaches.  Endo/Heme/Allergies:  Does not bruise/bleed easily.  Psychiatric/Behavioral:  Negative for depression and suicidal ideas. The patient does not have insomnia.      No Known Allergies   Past Medical History:  Diagnosis Date   Anxiety    Breast cancer ([place])    with mets   Cancer ([place])    Colitis    COVID-19 in immunocompromised patient ([place])    Family history of colon cancer    Vertigo      Past Surgical History:  Procedure Laterality Date   BREAST BIOPSY Left 08/14/2019   [place] bx of mass, coil marker, path pending   BREAST BIOPSY Left 08/14/2019   [place] bx of LN, hydromarker, path pending   BREAST BIOPSY Left 08/14/2019   affirm bx of calcs, x marker, path pending   ESOPHAGOGASTRODUODENOSCOPY (EGD) WITH PROPOFOL N/A 10/05/2019   Procedure: ESOPHAGOGASTRODUODENOSCOPY (EGD) WITH PROPOFOL;  Surgeon: Lin Landsman, MD;  Location: [facility];  Service: Gastroenterology;  Laterality: N/A;   FLEXIBLE SIGMOIDOSCOPY N/A 10/05/2019   Procedure: FLEXIBLE SIGMOIDOSCOPY;  Surgeon: Lin Landsman, MD;  Location: [facility]COPY;  Service: Gastroenterology;  Laterality: N/A;   INTRAMEDULLARY (IM) NAIL INTERTROCHANTERIC Right 09/01/2019   Procedure: INTRAMEDULLARY (IM) NAIL INTERTROCHANTRIC AND RADIOFREQUENCY ABLATION;  Surgeon: Hessie Knows, MD;  Location: [facility] ORS;  Service: Orthopedics;  Laterality: Right;   KYPHOPLASTY N/A 08/29/2019   Procedure: KYPHOPLASTY T6, L1,L4 ,  RADIOFREQUENCY ABLATION;  Surgeon: Hessie Knows, MD;  Location: [facility] ORS;  Service: Orthopedics;  Laterality: N/A;   KYPHOPLASTY Right 09/01/2019   Procedure: Right Sacral Radiofrequency Ablation and Cement Augmentation, Right sacrum and iliac crest;  Surgeon: Hessie Knows, MD;  Location:  [facility] ORS;  Service: Orthopedics;  Laterality: Right;   PORTA CATH INSERTION N/A 08/28/2019   Procedure: PORTA CATH INSERTION;  Surgeon: Algernon Huxley, MD;  Location: [facility];  Service: Cardiovascular;  Laterality: N/A;    Social History   Socioeconomic History   Marital status: Single    Spouse name: Not on file   Number of children: Not on file   Years of education: Not on file   Highest education level: Not on file  Occupational History   Not on file  Tobacco Use   Smoking status: Never   Smokeless tobacco: Never  Vaping Use   Vaping Use: Never used  Substance and Sexual Activity   Alcohol use: Not Currently   Drug use: Not Currently   Sexual activity: Not Currently    Birth control/protection: None  Other Topics Concern   Not on file  Social History Narrative   Lives at home with children   Social Determinants of Health   Financial Resource Strain: Not on file  Food Insecurity: Not on file  Transportation Needs: Not on file  Physical Activity: Not on file  Stress: Not on file  Social Connections: Not on file  Intimate Partner Violence: Not on file    Family History  Problem Relation Age of Onset   Colon cancer Maternal Uncle      Current Outpatient Medications:  calcium-vitamin D (OSCAL WITH D) 500-200 MG-UNIT tablet, Take 1 tablet by mouth 2 (two) times daily., Disp: 30 tablet, Rfl: 0   clindamycin (CLINDAGEL) 1 % gel, Apply topically 2 (two) times daily. X 7 days., Disp: 30 g, Rfl: 0   ergocalciferol (VITAMIN D2) 1.25 MG (50000 UT) capsule, Take 1 capsule (50,000 Units total) by mouth once a week. For 4 weeks. Then 1 capsule once monthly for 4 months, Disp: 8 capsule, Rfl: 0   gabapentin (NEURONTIN) 600 MG tablet, Take 1 tablet (600 mg total) by mouth 3 (three) times daily., Disp: 90 tablet, Rfl: 2   lidocaine-prilocaine (EMLA) cream, Apply 1 application topically as needed. Apply small amount to port site at least 1 hour prior to it being  accessed, cover with plastic wrap, Disp: 30 g, Rfl: 1   oxyCODONE (OXYCONTIN) 10 mg 12 hr tablet, Take 1 tablet (10 mg total) by mouth every 12 (twelve) hours., Disp: 60 tablet, Rfl: 0   albuterol (VENTOLIN HFA) 108 (90 Base) MCG/ACT inhaler, Inhale 2 puffs into the lungs every 6 (six) hours as needed for wheezing or shortness of breath. (Patient not taking: Reported on 11/19/2020), Disp: 8 g, Rfl: 2   cyclobenzaprine (FLEXERIL) 5 MG tablet, Take 1 tablet (5 mg total) by mouth 3 (three) times daily as needed for muscle spasms. (Patient not taking: No sig reported), Disp: 30 tablet, Rfl: 0   diphenoxylate-atropine (LOMOTIL) 2.5-0.025 MG tablet, Take 1 tablet by mouth 4 (four) times daily as needed for diarrhea or loose stools. (Patient not taking: Reported on 11/19/2020), Disp: 40 tablet, Rfl: 0   ondansetron (ZOFRAN) 8 MG tablet, Take 1 tablet (8 mg total) by mouth every 8 (eight) hours as needed for nausea or vomiting. (Patient not taking: Reported on 11/19/2020), Disp: 45 tablet, Rfl: 2   oxycodone (OXY-IR) 5 MG capsule, Take 1-2 capsules (5-10 mg total) by mouth every 4 (four) hours as needed. (Patient not taking: Reported on 11/19/2020), Disp: 90 capsule, Rfl: 0   potassium chloride SA (KLOR-CON) 20 MEQ tablet, Take 1 tablet (20 mEq total) by mouth daily for 7 days. (Patient not taking: Reported on 11/19/2020), Disp: 7 tablet, Rfl: 0   prochlorperazine (COMPAZINE) 10 MG tablet, Take 1 tablet (10 mg total) by mouth every 6 (six) hours as needed for nausea or vomiting. (Patient not taking: Reported on 11/19/2020), Disp: 45 tablet, Rfl: 2 No current facility-administered medications for this visit.  Facility-Administered Medications Ordered in Other Visits:    0.9 %  sodium chloride infusion, , Intravenous, Continuous, Burns, Wandra Feinstein, NP, Stopped at 06/02/20 1453   heparin lock flush 100 UNIT/ML injection, , , ,    heparin lock flush 100 unit/mL, 500 Units, Intravenous, Once, Sindy Guadeloupe, MD   heparin lock  flush 100 unit/mL, 500 Units, Intravenous, Once, Sindy Guadeloupe, MD   sodium chloride flush (NS) 0.9 % injection 10 mL, 10 mL, Intravenous, Once, Sindy Guadeloupe, MD   sodium chloride flush (NS) 0.9 % injection 10 mL, 10 mL, Intravenous, Once, Sindy Guadeloupe, MD  Physical exam:  Vitals:   11/19/20 0925  BP: 107/66  Pulse: 82  Resp: 16  Temp: 97.9 F (36.6 C)  SpO2: 100%  Weight: 137 lb 8 oz (62.4 kg)   Physical Exam Constitutional:      General: She is not in acute distress. Eyes:     General: No scleral icterus.    Conjunctiva/sclera: Conjunctivae normal.  Cardiovascular:     Rate and  Rhythm: Normal rate and regular rhythm.  Pulmonary:     Effort: Pulmonary effort is normal.  Skin:    General: Skin is warm and dry.  Neurological:     Mental Status: She is alert and oriented to person, place, and time.     CMP Latest Ref Rng & Units 11/19/2020  Glucose 70 - 99 mg/dL 107(H)  BUN 6 - 20 mg/dL 9  Creatinine 0.44 - 1.00 mg/dL 0.57  Sodium 135 - 145 mmol/L 136  Potassium 3.5 - 5.1 mmol/L 3.6  Chloride 98 - 111 mmol/L 104  CO2 22 - 32 mmol/L 23  Calcium 8.9 - 10.3 mg/dL 8.6(L)  Total Protein 6.5 - 8.1 g/dL 7.1  Total Bilirubin 0.3 - 1.2 mg/dL 0.6  Alkaline Phos 38 - 126 U/L 80  AST 15 - 41 U/L 33  ALT 0 - 44 U/L 22   CBC Latest Ref Rng & Units 11/19/2020  WBC 4.0 - 10.5 K/uL 4.8  Hemoglobin 12.0 - 15.0 g/dL 11.7(L)  Hematocrit 36.0 - 46.0 % 36.0  Platelets 150 - 400 K/uL 338     Assessment and plan- Patient is a 40 y.o. female with ER/PR negative HER2 positive metastatic breast cancer with bone metastases.  She is here for on treatment assessment prior to cycle 21 of Herceptin and Perjeta  Counts okay to proceed with Herceptin and Perjeta today.  Xgeva today. Herceptin and Perjeta in 3 weeks.  No labs Herceptin and Perjeta and Xgeva in 6 weeks with port labs CBC with differential and CMP and see Dr. Janese Banks  Neoplasm related pain: Continue as needed oxycodone  Bone  metastases: On Xgeva every 6 weeks   Visit Diagnosis 1. Encounter for antineoplastic chemotherapy   2. Primary cancer of left breast with metastasis to other site ([place])   3. Encounter for monitoring denosumab therapy   4. Neoplasm related pain      Dr. Randa Evens, MD, MPH [facility] [phone] 11/19/2020 4:00 PM

## 2020-12-06 ENCOUNTER — Other Ambulatory Visit: Payer: Self-pay

## 2020-12-06 ENCOUNTER — Other Ambulatory Visit: Payer: Self-pay | Admitting: *Deleted

## 2020-12-06 MED ORDER — OXYCODONE HCL 5 MG PO CAPS: 5.0000 mg | ORAL_CAPSULE | ORAL | 0 refills | Status: DC | PRN

## 2020-12-10 ENCOUNTER — Inpatient Hospital Stay: Payer: Self-pay

## 2020-12-10 ENCOUNTER — Inpatient Hospital Stay (HOSPITAL_BASED_OUTPATIENT_CLINIC_OR_DEPARTMENT_OTHER): Payer: Self-pay | Admitting: Oncology

## 2020-12-10 ENCOUNTER — Other Ambulatory Visit: Payer: Self-pay | Admitting: Oncology

## 2020-12-10 ENCOUNTER — Other Ambulatory Visit: Payer: Self-pay

## 2020-12-10 VITALS — BP 107/78 | HR 84 | Temp 98.1°F | Resp 18

## 2020-12-10 DIAGNOSIS — M8440XA Pathological fracture, unspecified site, initial encounter for fracture: Secondary | ICD-10-CM

## 2020-12-10 DIAGNOSIS — R300 Dysuria: Secondary | ICD-10-CM

## 2020-12-10 DIAGNOSIS — C50912 Malignant neoplasm of unspecified site of left female breast: Secondary | ICD-10-CM

## 2020-12-10 DIAGNOSIS — C50919 Malignant neoplasm of unspecified site of unspecified female breast: Secondary | ICD-10-CM

## 2020-12-10 DIAGNOSIS — Z5111 Encounter for antineoplastic chemotherapy: Secondary | ICD-10-CM

## 2020-12-10 DIAGNOSIS — G893 Neoplasm related pain (acute) (chronic): Secondary | ICD-10-CM

## 2020-12-10 LAB — COMPREHENSIVE METABOLIC PANEL
ALT: 27 U/L (ref 0–44)
AST: 33 U/L (ref 15–41)
Albumin: 3.8 g/dL (ref 3.5–5.0)
Alkaline Phosphatase: 73 U/L (ref 38–126)
Anion gap: 7 (ref 5–15)
BUN: 10 mg/dL (ref 6–20)
CO2: 26 mmol/L (ref 22–32)
Calcium: 9 mg/dL (ref 8.9–10.3)
Chloride: 103 mmol/L (ref 98–111)
Creatinine, Ser: 0.46 mg/dL (ref 0.44–1.00)
GFR, Estimated: >60 mL/min (ref >60)
Glucose, Bld: 96 mg/dL (ref 70–99)
Potassium: 3.9 mmol/L (ref 3.5–5.1)
Sodium: 136 mmol/L (ref 135–145)
Total Bilirubin: 0.3 mg/dL (ref 0.3–1.2)
Total Protein: 7.1 g/dL (ref 6.5–8.1)

## 2020-12-10 LAB — CBC WITH DIFFERENTIAL/PLATELET
Abs Immature Granulocytes: 0.01 10*3/uL (ref 0.00–0.07)
Basophils Absolute: 0 10*3/uL (ref 0.0–0.1)
Basophils Relative: 0 %
Eosinophils Absolute: 0.2 10*3/uL (ref 0.0–0.5)
Eosinophils Relative: 3 %
HCT: 37 % (ref 36.0–46.0)
Hemoglobin: 12.1 g/dL (ref 12.0–15.0)
Immature Granulocytes: 0 %
Lymphocytes Relative: 30 %
Lymphs Abs: 1.4 10*3/uL (ref 0.7–4.0)
MCH: 27.4 pg (ref 26.0–34.0)
MCHC: 32.7 g/dL (ref 30.0–36.0)
MCV: 83.9 fL (ref 80.0–100.0)
Monocytes Absolute: 0.4 10*3/uL (ref 0.1–1.0)
Monocytes Relative: 9 %
Neutro Abs: 2.7 10*3/uL (ref 1.7–7.7)
Neutrophils Relative %: 58 %
Platelets: 345 10*3/uL (ref 150–400)
RBC: 4.41 MIL/uL (ref 3.87–5.11)
RDW: 18.8 % — ABNORMAL HIGH (ref 11.5–15.5)
WBC: 4.7 10*3/uL (ref 4.0–10.5)
nRBC: 0 % (ref 0.0–0.2)

## 2020-12-10 LAB — URINALYSIS, COMPLETE (UACMP) WITH MICROSCOPIC
Bacteria, UA: NONE SEEN
Bilirubin Urine: NEGATIVE
Glucose, UA: NEGATIVE mg/dL
Hgb urine dipstick: NEGATIVE
Ketones, ur: NEGATIVE mg/dL
Leukocytes,Ua: NEGATIVE
Nitrite: NEGATIVE
Protein, ur: NEGATIVE mg/dL
Specific Gravity, Urine: 1.005 (ref 1.005–1.030)
Squamous Epithelial / HPF: NONE SEEN (ref 0–5)
pH: 6 (ref 5.0–8.0)

## 2020-12-10 MED ORDER — TRASTUZUMAB-ANNS CHEMO 150 MG IV SOLR
6.0000 mg/kg | Freq: Once | INTRAVENOUS | Status: AC
  Administered 2020-12-10: 357 mg via INTRAVENOUS
  Filled 2020-12-10: qty 17

## 2020-12-10 MED ORDER — SODIUM CHLORIDE 0.9 % IV SOLN
Freq: Once | INTRAVENOUS | Status: AC
  Filled 2020-12-10: qty 250

## 2020-12-10 MED ORDER — SODIUM CHLORIDE 0.9% FLUSH
10.0000 mL | INTRAVENOUS | Status: DC | PRN
  Administered 2020-12-10: 10 mL via INTRAVENOUS
  Filled 2020-12-10: qty 10

## 2020-12-10 MED ORDER — HEPARIN SOD (PORK) LOCK FLUSH 100 UNIT/ML IV SOLN
500.0000 [IU] | Freq: Once | INTRAVENOUS | Status: DC | PRN
Start: 2020-12-10 — End: 2020-12-10
  Filled 2020-12-10: qty 5

## 2020-12-10 MED ORDER — ACETAMINOPHEN 325 MG PO TABS
650.0000 mg | ORAL_TABLET | Freq: Once | ORAL | Status: AC
  Administered 2020-12-10: 650 mg via ORAL
  Filled 2020-12-10: qty 2

## 2020-12-10 MED ORDER — FAMOTIDINE 20 MG IN NS 100 ML IVPB: 20.0000 mg | Freq: Once | INTRAVENOUS | Status: DC

## 2020-12-10 MED ORDER — DIPHENHYDRAMINE HCL 50 MG/ML IJ SOLN
50.0000 mg | Freq: Once | INTRAMUSCULAR | Status: AC
  Administered 2020-12-10: 50 mg via INTRAVENOUS
  Filled 2020-12-10: qty 1

## 2020-12-10 MED ORDER — SODIUM CHLORIDE 0.9 % IV SOLN
20.0000 mg | Freq: Once | INTRAVENOUS | Status: DC
  Filled 2020-12-10: qty 2

## 2020-12-10 MED ORDER — HEPARIN SOD (PORK) LOCK FLUSH 100 UNIT/ML IV SOLN
500.0000 [IU] | Freq: Once | INTRAVENOUS | Status: DC
  Filled 2020-12-10: qty 5

## 2020-12-10 MED ORDER — SODIUM CHLORIDE 0.9% FLUSH
10.0000 mL | INTRAVENOUS | Status: DC | PRN
  Filled 2020-12-10: qty 10

## 2020-12-10 MED ORDER — SODIUM CHLORIDE 0.9 % IV SOLN
420.0000 mg | Freq: Once | INTRAVENOUS | Status: AC
  Administered 2020-12-10: 420 mg via INTRAVENOUS
  Filled 2020-12-10: qty 14

## 2020-12-10 NOTE — Patient Instructions (Signed)
[facility] ONCOLOGY  Discharge Instructions: Thank you for choosing [facility] to provide your oncology and hematology care.  If you have a lab appointment with the [facility], please go directly to the [facility] and check in at the registration area.  Wear comfortable clothing and clothing appropriate for easy access to any Portacath or PICC line.   We strive to give you quality time with your provider. You may need to reschedule your appointment if you arrive late (15 or more minutes).  Arriving late affects you and other patients whose appointments are after yours.  Also, if you miss three or more appointments without notifying the office, you may be dismissed from the clinic at the provider's discretion.      For prescription refill requests, have your pharmacy contact our office and allow 72 hours for refills to be completed.    Today you received the following chemotherapy and/or immunotherapy agents : Trastuzumab, pertuzumab       To help prevent nausea and vomiting after your treatment, we encourage you to take your nausea medication as directed.  BELOW ARE SYMPTOMS THAT SHOULD BE REPORTED IMMEDIATELY: *FEVER GREATER THAN 100.4 F (38 C) OR HIGHER *CHILLS OR SWEATING *NAUSEA AND VOMITING THAT IS NOT CONTROLLED WITH YOUR NAUSEA MEDICATION *UNUSUAL SHORTNESS OF BREATH *UNUSUAL BRUISING OR BLEEDING *URINARY PROBLEMS (pain or burning when urinating, or frequent urination) *BOWEL PROBLEMS (unusual diarrhea, constipation, pain near the anus) TENDERNESS IN MOUTH AND THROAT WITH OR WITHOUT PRESENCE OF ULCERS (sore throat, sores in mouth, or a toothache) UNUSUAL RASH, SWELLING OR PAIN  UNUSUAL VAGINAL DISCHARGE OR ITCHING   Items with * indicate a potential emergency and should be followed up as soon as possible or go to the Emergency Department if any problems should occur.  Please show the CHEMOTHERAPY ALERT CARD or IMMUNOTHERAPY ALERT  CARD at check-in to the Emergency Department and triage nurse.  Should you have questions after your visit or need to cancel or reschedule your appointment, please contact [facility]  [phone] and follow the prompts.  Office hours are 8:00 a.m. to 4:30 p.m. Monday - Friday. Please note that voicemails left after 4:00 p.m. may not be returned until the following business day.  We are closed weekends and major holidays. You have access to a nurse at all times for urgent questions. Please call the main number to the clinic [phone] and follow the prompts.  For any non-urgent questions, you may also contact your provider using MyChart. We now offer e-Visits for anyone 15 and older to request care online for non-urgent symptoms. For details visit [email or web address].   Also download the MyChart app! Go to the app store, search "MyChart", open the app, select [facility], and log in with your MyChart username and password.  Due to Covid, a mask is required upon entering the hospital/clinic. If you do not have a mask, one will be given to you upon arrival. For doctor visits, patients may have 1 support person aged 58 or older with them. For treatment visits, patients cannot have anyone with them due to current Covid guidelines and our immunocompromised population.

## 2020-12-10 NOTE — Progress Notes (Signed)
Hematology/Oncology Consult note [facility]  Telephone:([phone] [phone]  Patient Care Team: [place], [place], NP as PCP - General Rico Junker, RN as Registered Nurse Theodore Demark, RN as Registered Nurse Sindy Guadeloupe, MD as Consulting Physician (Hematology and Oncology)   Name of the patient: Sophia [name]  [id number]  1980-04-07   Date of visit: 12/16/20  Diagnosis- stage IV metastatic breast cancer ER/PR negative HER-2/neu positive with bone metastases   Chief complaint/ Reason for visit-on treatment assessment prior to cycle 22 of Herceptin and Perjeta  Heme/Onc history: patient is a 40 year old Hispanic female who is here with her friend.  History obtained with the help of an interpreter.Patient self palpated left breast mass which was followed by a diagnostic bilateral mammogram.  Mammogram showed 3.1 x 2.9 x 1.9 cm hypoechoic mass at the 1 o'clock position of the left breast.  For abnormal cortically thickened left axillary lymph nodes measuring up to 5 mm.  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  Patient was also having ongoing back pain and was seen by [facility] orthopedics Dr. Doyle Askew who ordered MRI lumbar spine without contrast which showed possible pathologic fractures of L1 and L4 vertebral bodies with greater than 50% height loss at L1 and abnormal signal involving L2-L3 S1 as well as right iliac bone concerning for metastatic disease.  Patient is a single mother of 3 adult children and is very anxious today.  She reports significant back pain which radiates to her bilateral thighs.  Denies any focal tingling numbness or weakness.  Denies any bowel bladder incontinence.  Pain has been uncontrolled despite taking Tylenol.  No prior history of abnormal breast biopsies.  No family history of breast  cancer   PET and MRI showed 3 areas of pathologic fracture of her spine as well as widespread bony metastatic disease and concern for impending fracture of the right hip.  Given her worsening pain she was asked to come to the ER.  She has been evaluated by Dr. Rudene Christians from orthopedic surgery and underwent kyphoplasty at 3 different levels. T6 L1 and L4 along with radiofrequency ablation.    She also underwent prophylactic fixation of the right hip and not affected the sacral region.     Patient received first dose of Herceptin and Perjeta on 09/04/2019.  Baseline echocardiogram normal.she is currently getting taxol/ herceptin/perjeta   Patient admitted to hospital for acute abdominal pain with CT findings concerning for acute colitis.  Colonoscopy showed diffuse severe inflammation with erythema friability and loss of vascularity and shallow ulcerations in the IC valve, ascending colon and cecum.  Interval history-patient reports doing well.  Headaches have improved.  Reports some stable muscle pain and worsening lower extremity numbness and tingling.  This is intermittent and comes and goes.  She has been taking pain medicines with some relief.  States is worse while ambulating. Has had some mild dysuria and has been taking an old prescription of Bactrim with improvement of her symptoms.  ECOG PS- 1 Pain scale- 2 Opioid associated constipation- no  Review of systems- Review of Systems  Constitutional:  Positive for malaise/fatigue.  Gastrointestinal:  Positive for constipation.  Genitourinary:  Positive for dysuria and urgency.  Musculoskeletal:  Positive for joint pain.  Neurological:  Positive for tingling.  Psychiatric/Behavioral:  The patient is nervous/anxious.  No Known Allergies   Past Medical History:  Diagnosis Date   Anxiety    Breast cancer ([place])    with mets   Cancer ([place])    Colitis    COVID-19 in immunocompromised patient ([place])    Family history of colon cancer     Vertigo      Past Surgical History:  Procedure Laterality Date   BREAST BIOPSY Left 08/14/2019   [place] bx of mass, coil marker, path pending   BREAST BIOPSY Left 08/14/2019   [place] bx of LN, hydromarker, path pending   BREAST BIOPSY Left 08/14/2019   affirm bx of calcs, x marker, path pending   ESOPHAGOGASTRODUODENOSCOPY (EGD) WITH PROPOFOL N/A 10/05/2019   Procedure: ESOPHAGOGASTRODUODENOSCOPY (EGD) WITH PROPOFOL;  Surgeon: Lin Landsman, MD;  Location: [place];  Service: Gastroenterology;  Laterality: N/A;   FLEXIBLE SIGMOIDOSCOPY N/A 10/05/2019   Procedure: FLEXIBLE SIGMOIDOSCOPY;  Surgeon: Lin Landsman, MD;  Location: [facility] ENDOSCOPY;  Service: Gastroenterology;  Laterality: N/A;   INTRAMEDULLARY (IM) NAIL INTERTROCHANTERIC Right 09/01/2019   Procedure: INTRAMEDULLARY (IM) NAIL INTERTROCHANTRIC AND RADIOFREQUENCY ABLATION;  Surgeon: Hessie Knows, MD;  Location: [facility] ORS;  Service: Orthopedics;  Laterality: Right;   KYPHOPLASTY N/A 08/29/2019   Procedure: KYPHOPLASTY T6, L1,L4 ,  RADIOFREQUENCY ABLATION;  Surgeon: Hessie Knows, MD;  Location: [facility] ORS;  Service: Orthopedics;  Laterality: N/A;   KYPHOPLASTY Right 09/01/2019   Procedure: Right Sacral Radiofrequency Ablation and Cement Augmentation, Right sacrum and iliac crest;  Surgeon: Hessie Knows, MD;  Location: [facility] ORS;  Service: Orthopedics;  Laterality: Right;   PORTA CATH INSERTION N/A 08/28/2019   Procedure: PORTA CATH INSERTION;  Surgeon: Algernon Huxley, MD;  Location: [place] CV LAB;  Service: Cardiovascular;  Laterality: N/A;    Social History   Socioeconomic History   Marital status: Single    Spouse name: Not on file   Number of children: Not on file   Years of education: Not on file   Highest education level: Not on file  Occupational History   Not on file  Tobacco Use   Smoking status: Never   Smokeless tobacco: Never  Vaping Use   Vaping Use: Never used  Substance and Sexual Activity    Alcohol use: Not Currently   Drug use: Not Currently   Sexual activity: Not Currently    Birth control/protection: None  Other Topics Concern   Not on file  Social History Narrative   Lives at home with children   Social Determinants of Health   Financial Resource Strain: Not on file  Food Insecurity: Not on file  Transportation Needs: Not on file  Physical Activity: Not on file  Stress: Not on file  Social Connections: Not on file  Intimate Partner Violence: Not on file    Family History  Problem Relation Age of Onset   Colon cancer Maternal Uncle      Current Outpatient Medications:    albuterol (VENTOLIN HFA) 108 (90 Base) MCG/ACT inhaler, Inhale 2 puffs into the lungs every 6 (six) hours as needed for wheezing or shortness of breath. (Patient not taking: Reported on 11/19/2020), Disp: 8 g, Rfl: 2   calcium-vitamin D (OSCAL WITH D) 500-200 MG-UNIT tablet, Take 1 tablet by mouth 2 (two) times daily., Disp: 30 tablet, Rfl: 0   clindamycin (CLINDAGEL) 1 % gel, Apply topically 2 (two) times daily. X 7 days., Disp: 30 g, Rfl: 0   cyclobenzaprine (FLEXERIL) 5 MG tablet, Take 1 tablet (5 mg total) by  mouth 3 (three) times daily as needed for muscle spasms. (Patient not taking: No sig reported), Disp: 30 tablet, Rfl: 0   diphenoxylate-atropine (LOMOTIL) 2.5-0.025 MG tablet, Take 1 tablet by mouth 4 (four) times daily as needed for diarrhea or loose stools. (Patient not taking: Reported on 11/19/2020), Disp: 40 tablet, Rfl: 0   ergocalciferol (VITAMIN D2) 1.25 MG (50000 UT) capsule, Take 1 capsule (50,000 Units total) by mouth once a week. For 4 weeks. Then 1 capsule once monthly for 4 months, Disp: 8 capsule, Rfl: 0   gabapentin (NEURONTIN) 600 MG tablet, Take 1 tablet (600 mg total) by mouth 3 (three) times daily., Disp: 90 tablet, Rfl: 2   lidocaine-prilocaine (EMLA) cream, Apply 1 application topically as needed. Apply small amount to port site at least 1 hour prior to it being accessed,  cover with plastic wrap, Disp: 30 g, Rfl: 1   ondansetron (ZOFRAN) 8 MG tablet, Take 1 tablet (8 mg total) by mouth every 8 (eight) hours as needed for nausea or vomiting. (Patient not taking: Reported on 11/19/2020), Disp: 45 tablet, Rfl: 2   oxycodone (OXY-IR) 5 MG capsule, Take 1-2 capsules (5-10 mg total) by mouth every 4 (four) hours as needed., Disp: 90 capsule, Rfl: 0   oxyCODONE (OXYCONTIN) 10 mg 12 hr tablet, Take 1 tablet (10 mg total) by mouth every 12 (twelve) hours., Disp: 60 tablet, Rfl: 0   potassium chloride SA (KLOR-CON) 20 MEQ tablet, Take 1 tablet (20 mEq total) by mouth daily for 7 days. (Patient not taking: Reported on 11/19/2020), Disp: 7 tablet, Rfl: 0   prochlorperazine (COMPAZINE) 10 MG tablet, Take 1 tablet (10 mg total) by mouth every 6 (six) hours as needed for nausea or vomiting. (Patient not taking: Reported on 11/19/2020), Disp: 45 tablet, Rfl: 2 No current facility-administered medications for this visit.  Facility-Administered Medications Ordered in Other Visits:    0.9 %  sodium chloride infusion, , Intravenous, Continuous, Crystalee Ventress, Wandra Feinstein, NP, Stopped at 06/02/20 1453   heparin lock flush 100 unit/mL, 500 Units, Intravenous, Once, Sindy Guadeloupe, MD   heparin lock flush 100 unit/mL, 500 Units, Intravenous, Once, Sindy Guadeloupe, MD   sodium chloride flush (NS) 0.9 % injection 10 mL, 10 mL, Intravenous, Once, Sindy Guadeloupe, MD   sodium chloride flush (NS) 0.9 % injection 10 mL, 10 mL, Intravenous, Once, Sindy Guadeloupe, MD  Physical exam:  Vitals:   12/10/20 1053  BP: 107/78  Pulse: 84  Resp: 18  Temp: 98.1 F (36.7 C)  TempSrc: Tympanic  SpO2: 100%   Physical Exam Constitutional:      Appearance: Normal appearance.  HENT:     Head: Normocephalic and atraumatic.  Eyes:     Pupils: Pupils are equal, round, and reactive to light.  Cardiovascular:     Rate and Rhythm: Normal rate and regular rhythm.     Heart sounds: Normal heart sounds. No murmur  heard. Pulmonary:     Effort: Pulmonary effort is normal.     Breath sounds: Normal breath sounds. No wheezing.  Abdominal:     General: Bowel sounds are normal. There is no distension.     Palpations: Abdomen is soft.     Tenderness: There is no abdominal tenderness.  Musculoskeletal:        General: Normal range of motion.     Cervical back: Normal range of motion.  Skin:    General: Skin is warm and dry.     Findings: No  rash.  Neurological:     Mental Status: She is alert and oriented to person, place, and time.  Psychiatric:        Judgment: Judgment normal.     CMP Latest Ref Rng & Units 12/10/2020  Glucose 70 - 99 mg/dL 96  BUN 6 - 20 mg/dL 10  Creatinine 0.44 - 1.00 mg/dL 0.46  Sodium 135 - 145 mmol/L 136  Potassium 3.5 - 5.1 mmol/L 3.9  Chloride 98 - 111 mmol/L 103  CO2 22 - 32 mmol/L 26  Calcium 8.9 - 10.3 mg/dL 9.0  Total Protein 6.5 - 8.1 g/dL 7.1  Total Bilirubin 0.3 - 1.2 mg/dL 0.3  Alkaline Phos 38 - 126 U/L 73  AST 15 - 41 U/L 33  ALT 0 - 44 U/L 27   CBC Latest Ref Rng & Units 12/10/2020  WBC 4.0 - 10.5 K/uL 4.7  Hemoglobin 12.0 - 15.0 g/dL 12.1  Hematocrit 36.0 - 46.0 % 37.0  Platelets 150 - 400 K/uL 345     Assessment and plan- Patient is a 40 y.o. female with ER/PR negative HER2 positive metastatic breast cancer with bone metastases.  She is here for follow-up for cycle 22 of Herceptin and Perjeta.  Labs from today are acceptable for treatment.  RTC as scheduled to see Dr. Janese Banks with lab work prior to her next cycle of Herceptin and Perjeta plus Xgeva.   New onset numbness and tingling of her lower extremities- Intermittent.  I have asked her to continue to monitor this.  She may take her oxycodone as needed.  She will let Dr. Janese Banks know if this worsens after her treatment.  Neoplasm related pain- Continue oxycodone as needed.  Headache- Appears to be improving.  Dysuria- She completed 3 to 4 days worth of Bactrim.  Continues to have dysuria and  increased urinary frequency.  We will recheck UA and culture today.  We will let her know if she needs additional antibiotics. Urinalysis and urine culture were negative.  I spent 25 minutes dedicated to the care of this patient (face-to-face and non-face-to-face) on the date of the encounter to include what is described in the assessment and plan.  Visit Diagnosis 1. Encounter for antineoplastic chemotherapy   2. Primary cancer of left breast with metastasis to other site ([place])   3. Neoplasm related pain   4. Burning with urination     Faythe Casa, NP 12/16/2020 3:46 PM

## 2020-12-12 LAB — URINE CULTURE: Culture: <10000 — AB

## 2020-12-16 ENCOUNTER — Encounter: Payer: Self-pay | Admitting: Oncology

## 2020-12-17 ENCOUNTER — Other Ambulatory Visit: Payer: Self-pay | Admitting: *Deleted

## 2020-12-17 ENCOUNTER — Telehealth: Payer: Self-pay | Admitting: *Deleted

## 2020-12-17 MED ORDER — GABAPENTIN 600 MG PO TABS: 600.0000 mg | ORAL_TABLET | Freq: Three times a day (TID) | ORAL | 3 refills | Status: DC

## 2020-12-17 NOTE — Telephone Encounter (Signed)
Pt called and needed refill of gabapentin and it was refilled and put refills on it and I called her back and left message that it was refilled

## 2020-12-20 ENCOUNTER — Telehealth: Payer: Self-pay | Admitting: *Deleted

## 2020-12-20 ENCOUNTER — Other Ambulatory Visit: Payer: Self-pay | Admitting: *Deleted

## 2020-12-20 MED ORDER — OXYCODONE HCL ER 10 MG PO T12A: 10.0000 mg | EXTENDED_RELEASE_TABLET | Freq: Two times a day (BID) | ORAL | 0 refills | Status: DC

## 2020-12-20 NOTE — Telephone Encounter (Signed)
Called the pt back and got her voicemail and let her know that the long acting pain med was [facility].

## 2020-12-23 ENCOUNTER — Inpatient Hospital Stay (HOSPITAL_BASED_OUTPATIENT_CLINIC_OR_DEPARTMENT_OTHER): Payer: Self-pay | Admitting: Hospice and Palliative Medicine

## 2020-12-23 ENCOUNTER — Inpatient Hospital Stay: Payer: Self-pay | Attending: Oncology

## 2020-12-23 ENCOUNTER — Telehealth: Payer: Self-pay | Admitting: *Deleted

## 2020-12-23 VITALS — BP 121/84 | HR 79 | Temp 98.9°F | Resp 18

## 2020-12-23 DIAGNOSIS — M25579 Pain in unspecified ankle and joints of unspecified foot: Secondary | ICD-10-CM

## 2020-12-23 DIAGNOSIS — Z95828 Presence of other vascular implants and grafts: Secondary | ICD-10-CM

## 2020-12-23 DIAGNOSIS — R252 Cramp and spasm: Secondary | ICD-10-CM

## 2020-12-23 DIAGNOSIS — G893 Neoplasm related pain (acute) (chronic): Secondary | ICD-10-CM

## 2020-12-23 DIAGNOSIS — Z171 Estrogen receptor negative status [ER-]: Secondary | ICD-10-CM | POA: Insufficient documentation

## 2020-12-23 DIAGNOSIS — C50919 Malignant neoplasm of unspecified site of unspecified female breast: Secondary | ICD-10-CM

## 2020-12-23 DIAGNOSIS — C7951 Secondary malignant neoplasm of bone: Secondary | ICD-10-CM | POA: Insufficient documentation

## 2020-12-23 DIAGNOSIS — R200 Anesthesia of skin: Secondary | ICD-10-CM | POA: Insufficient documentation

## 2020-12-23 DIAGNOSIS — Z5112 Encounter for antineoplastic immunotherapy: Secondary | ICD-10-CM | POA: Insufficient documentation

## 2020-12-23 DIAGNOSIS — C50412 Malignant neoplasm of upper-outer quadrant of left female breast: Secondary | ICD-10-CM | POA: Insufficient documentation

## 2020-12-23 LAB — CBC WITH DIFFERENTIAL/PLATELET
Abs Immature Granulocytes: 0.02 10*3/uL (ref 0.00–0.07)
Basophils Absolute: 0 10*3/uL (ref 0.0–0.1)
Basophils Relative: 1 %
Eosinophils Absolute: 0.2 10*3/uL (ref 0.0–0.5)
Eosinophils Relative: 3 %
HCT: 38.7 % (ref 36.0–46.0)
Hemoglobin: 12.7 g/dL (ref 12.0–15.0)
Immature Granulocytes: 0 %
Lymphocytes Relative: 30 %
Lymphs Abs: 1.8 10*3/uL (ref 0.7–4.0)
MCH: 27.5 pg (ref 26.0–34.0)
MCHC: 32.8 g/dL (ref 30.0–36.0)
MCV: 83.8 fL (ref 80.0–100.0)
Monocytes Absolute: 0.4 10*3/uL (ref 0.1–1.0)
Monocytes Relative: 7 %
Neutro Abs: 3.7 10*3/uL (ref 1.7–7.7)
Neutrophils Relative %: 59 %
Platelets: 355 10*3/uL (ref 150–400)
RBC: 4.62 MIL/uL (ref 3.87–5.11)
RDW: 17.8 % — ABNORMAL HIGH (ref 11.5–15.5)
WBC: 6.2 10*3/uL (ref 4.0–10.5)
nRBC: 0 % (ref 0.0–0.2)

## 2020-12-23 LAB — COMPREHENSIVE METABOLIC PANEL
ALT: 21 U/L (ref 0–44)
AST: 26 U/L (ref 15–41)
Albumin: 3.8 g/dL (ref 3.5–5.0)
Alkaline Phosphatase: 79 U/L (ref 38–126)
Anion gap: 6 (ref 5–15)
BUN: 13 mg/dL (ref 6–20)
CO2: 26 mmol/L (ref 22–32)
Calcium: 9.3 mg/dL (ref 8.9–10.3)
Chloride: 104 mmol/L (ref 98–111)
Creatinine, Ser: 0.45 mg/dL (ref 0.44–1.00)
GFR, Estimated: >60 mL/min (ref >60)
Glucose, Bld: 123 mg/dL — ABNORMAL HIGH (ref 70–99)
Potassium: 3.8 mmol/L (ref 3.5–5.1)
Sodium: 136 mmol/L (ref 135–145)
Total Bilirubin: 0.4 mg/dL (ref 0.3–1.2)
Total Protein: 7.7 g/dL (ref 6.5–8.1)

## 2020-12-23 LAB — MAGNESIUM: Magnesium: 2.1 mg/dL (ref 1.7–2.4)

## 2020-12-23 MED ORDER — SODIUM CHLORIDE 0.9% FLUSH
10.0000 mL | Freq: Once | INTRAVENOUS | Status: AC
  Administered 2020-12-23: 10 mL via INTRAVENOUS
  Filled 2020-12-23: qty 10

## 2020-12-23 MED ORDER — DULOXETINE HCL 30 MG PO CPEP: 30.0000 mg | ORAL_CAPSULE | Freq: Every day | ORAL | 3 refills | Status: DC

## 2020-12-23 MED ORDER — HEPARIN SOD (PORK) LOCK FLUSH 100 UNIT/ML IV SOLN
500.0000 [IU] | Freq: Once | INTRAVENOUS | Status: AC
  Administered 2020-12-23: 500 [IU] via INTRAVENOUS
  Filled 2020-12-23: qty 5

## 2020-12-23 NOTE — Telephone Encounter (Signed)
Pt called with having increase pain , she says that her feet her , legs cramping and ribls bilateral hurting her. Her ankles hurt also. She is already on gabapentin, and long and short acting pain med. She has had to take it a lot more and the pain is not going away. Go there [facility] appt 1:30. She is aware and will be there

## 2020-12-23 NOTE — Progress Notes (Signed)
Symptom Management [place]  [phone] [phone]  Patient Care Team: Center, [place], NP as PCP - General Rico Junker, RN as Registered Nurse Theodore Demark, RN as Registered Nurse Sindy Guadeloupe, MD as Consulting Physician (Hematology and Oncology)   Name of the patient: Sophia [name]  [id number]  [birth year]   Date of visit: 12/23/20  Reason for Consult:  Sophia [name] is a 40 y.o. 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    Patient last saw Rulon Abide, NP on 12/10/2020 for cycle 22 of Herceptin and Perjeta.  She was noted to have new onset numbness and tingling of the lower extremities.  Patient is on gabapentin 600 mg 3 times daily, OxyContin 10 mg every 12 hours, and oxycodone IR 5 to 10 mg every 4 hours as needed for breakthrough pain.  Patient presents to [facility] today with continued reports of numbness and tingling in the bilateral lower extremities and cramping in the feet and legs.  She denies swelling or injury.  No calf pain.  She reports that symptoms are worse at night when she is trying to sleep.  She does not report involuntary leg movements at bedtime.  Denies recent fevers or illnesses. Denies any easy bleeding or bruising. Reports good appetite and denies weight loss. Denies chest pain. Denies any nausea, vomiting, constipation, or diarrhea. Denies urinary complaints. Patient offers no further specific complaints today.    PAST MEDICAL HISTORY: Past Medical History:  Diagnosis Date   Anxiety    Breast cancer ([place])    with mets   Cancer ([place])    Colitis    COVID-19 in immunocompromised patient ([place])    Family history of colon cancer    Vertigo     PAST SURGICAL HISTORY:  Past Surgical History:  Procedure Laterality Date   BREAST BIOPSY Left 08/14/2019   [place] bx of mass, coil marker,  path pending   BREAST BIOPSY Left 08/14/2019   [place] bx of LN, hydromarker, path pending   BREAST BIOPSY Left 08/14/2019   affirm bx of calcs, x marker, path pending   ESOPHAGOGASTRODUODENOSCOPY (EGD) WITH PROPOFOL N/A 10/05/2019   Procedure: ESOPHAGOGASTRODUODENOSCOPY (EGD) WITH PROPOFOL;  Surgeon: Lin Landsman, MD;  Location: [place];  Service: Gastroenterology;  Laterality: N/A;   FLEXIBLE SIGMOIDOSCOPY N/A 10/05/2019   Procedure: FLEXIBLE SIGMOIDOSCOPY;  Surgeon: Lin Landsman, MD;  Location: [facility] ENDOSCOPY;  Service: Gastroenterology;  Laterality: N/A;   INTRAMEDULLARY (IM) NAIL INTERTROCHANTERIC Right 09/01/2019   Procedure: INTRAMEDULLARY (IM) NAIL INTERTROCHANTRIC AND RADIOFREQUENCY ABLATION;  Surgeon: Hessie Knows, MD;  Location: [facility] ORS;  Service: Orthopedics;  Laterality: Right;   KYPHOPLASTY N/A 08/29/2019   Procedure: KYPHOPLASTY T6, L1,L4 ,  RADIOFREQUENCY ABLATION;  Surgeon: Hessie Knows, MD;  Location: [facility] ORS;  Service: Orthopedics;  Laterality: N/A;   KYPHOPLASTY Right 09/01/2019   Procedure: Right Sacral Radiofrequency Ablation and Cement Augmentation, Right sacrum and iliac crest;  Surgeon: Hessie Knows, MD;  Location: [facility] ORS;  Service: Orthopedics;  Laterality: Right;   PORTA CATH INSERTION N/A 08/28/2019   Procedure: PORTA CATH INSERTION;  Surgeon: Algernon Huxley, MD;  Location: [place] CV LAB;  Service: Cardiovascular;  Laterality: N/A;    HEMATOLOGY/ONCOLOGY HISTORY:  Oncology History  Metastatic breast cancer ([place])  08/29/2019 Initial Diagnosis   Metastatic breast cancer ([place])   09/04/2019 -  Chemotherapy    Patient is on  Treatment Plan: BREAST WEEKLY PACLITAXEL + TRASTUZUMAB + PERTUZUMAB Q21D        Genetic Testing   Negative genetic testing. No pathogenic variants identified on the [facility] CancerNext-Expanded+RNA panel. The report date is 04/13/2020.   The CancerNext-Expanded + RNAinsight gene panel offered by [employer] and includes  sequencing and rearrangement analysis for the following 77 genes: IP, ALK, APC*, ATM*, AXIN2, BAP1, BARD1, BLM, BMPR1A, BRCA1*, BRCA2*, BRIP1*, CDC73, CDH1*,CDK4, CDKN1B, CDKN2A, CHEK2*, CTNNA1, DICER1, FANCC, FH, FLCN, GALNT12, KIF1B, LZTR1, MAX, MEN1, MET, MLH1*, MSH2*, MSH3, MSH6*, MUTYH*, NBN, NF1*, NF2, NTHL1, PALB2*, PHOX2B, PMS2*, POT1, PRKAR1A, PTCH1, PTEN*, RAD51C*, RAD51D*,RB1, RECQL, RET, SDHA, SDHAF2, SDHB, SDHC, SDHD, SMAD4, SMARCA4, SMARCB1, SMARCE1, STK11, SUFU, TMEM127, TP53*,TSC1, TSC2, VHL and XRCC2 (sequencing and deletion/duplication); EGFR, EGLN1, HOXB13, KIT, MITF, PDGFRA, POLD1 and POLE (sequencing only); EPCAM and GREM1 (deletion/duplication only). DNA and RNA analyses performed for * genes.     ALLERGIES:  has No Known Allergies.  MEDICATIONS:  Current Outpatient Medications  Medication Sig Dispense Refill   albuterol (VENTOLIN HFA) 108 (90 Base) MCG/ACT inhaler Inhale 2 puffs into the lungs every 6 (six) hours as needed for wheezing or shortness of breath. (Patient not taking: Reported on 11/19/2020) 8 g 2   calcium-vitamin D (OSCAL WITH D) 500-200 MG-UNIT tablet Take 1 tablet by mouth 2 (two) times daily. 30 tablet 0   clindamycin (CLINDAGEL) 1 % gel Apply topically 2 (two) times daily. X 7 days. 30 g 0   cyclobenzaprine (FLEXERIL) 5 MG tablet Take 1 tablet (5 mg total) by mouth 3 (three) times daily as needed for muscle spasms. (Patient not taking: No sig reported) 30 tablet 0   diphenoxylate-atropine (LOMOTIL) 2.5-0.025 MG tablet Take 1 tablet by mouth 4 (four) times daily as needed for diarrhea or loose stools. (Patient not taking: Reported on 11/19/2020) 40 tablet 0   ergocalciferol (VITAMIN D2) 1.25 MG (50000 UT) capsule Take 1 capsule (50,000 Units total) by mouth once a week. For 4 weeks. Then 1 capsule once monthly for 4 months 8 capsule 0   gabapentin (NEURONTIN) 600 MG tablet Take 1 tablet (600 mg total) by mouth 3 (three) times daily. 90 tablet 3    lidocaine-prilocaine (EMLA) cream Apply 1 application topically as needed. Apply small amount to port site at least 1 hour prior to it being accessed, cover with plastic wrap 30 g 1   ondansetron (ZOFRAN) 8 MG tablet Take 1 tablet (8 mg total) by mouth every 8 (eight) hours as needed for nausea or vomiting. (Patient not taking: Reported on 11/19/2020) 45 tablet 2   oxycodone (OXY-IR) 5 MG capsule Take 1-2 capsules (5-10 mg total) by mouth every 4 (four) hours as needed. 90 capsule 0   oxyCODONE (OXYCONTIN) 10 mg 12 hr tablet Take 1 tablet (10 mg total) by mouth every 12 (twelve) hours. 60 tablet 0   potassium chloride SA (KLOR-CON) 20 MEQ tablet Take 1 tablet (20 mEq total) by mouth daily for 7 days. (Patient not taking: Reported on 11/19/2020) 7 tablet 0   prochlorperazine (COMPAZINE) 10 MG tablet Take 1 tablet (10 mg total) by mouth every 6 (six) hours as needed for nausea or vomiting. (Patient not taking: Reported on 11/19/2020) 45 tablet 2   No current facility-administered medications for this visit.   Facility-Administered Medications Ordered in Other Visits  Medication Dose Route Frequency Provider Last Rate Last Admin   0.9 %  sodium chloride infusion   Intravenous Continuous Mauro Kaufmann, NP  Stopped at 06/02/20 1453   heparin lock flush 100 unit/mL  500 Units Intravenous Once Sindy Guadeloupe, MD       heparin lock flush 100 unit/mL  500 Units Intravenous Once Sindy Guadeloupe, MD       sodium chloride flush (NS) 0.9 % injection 10 mL  10 mL Intravenous Once Sindy Guadeloupe, MD       sodium chloride flush (NS) 0.9 % injection 10 mL  10 mL Intravenous Once Sindy Guadeloupe, MD        VITAL SIGNS: BP 121/84   Pulse 79   Temp 98.9 F (37.2 C) (Tympanic)   Resp 18   SpO2 100%  There were no vitals filed for this visit.  Estimated body mass index is 26.85 kg/m as calculated from the following:   Height as of 10/08/20: 5' (1.524 m).   Weight as of 11/19/20: 137 lb 8 oz (62.4  kg).  LABS: CBC:    Component Value Date/Time   WBC 4.7 12/10/2020 1043   HGB 12.1 12/10/2020 1043   HCT 37.0 12/10/2020 1043   PLT 345 12/10/2020 1043   MCV 83.9 12/10/2020 1043   NEUTROABS 2.7 12/10/2020 1043   LYMPHSABS 1.4 12/10/2020 1043   MONOABS 0.4 12/10/2020 1043   EOSABS 0.2 12/10/2020 1043   BASOSABS 0.0 12/10/2020 1043   Comprehensive Metabolic Panel:    Component Value Date/Time   NA 136 12/10/2020 1043   K 3.9 12/10/2020 1043   CL 103 12/10/2020 1043   CO2 26 12/10/2020 1043   BUN 10 12/10/2020 1043   CREATININE 0.46 12/10/2020 1043   GLUCOSE 96 12/10/2020 1043   CALCIUM 9.0 12/10/2020 1043   AST 33 12/10/2020 1043   ALT 27 12/10/2020 1043   ALKPHOS 73 12/10/2020 1043   BILITOT 0.3 12/10/2020 1043   PROT 7.1 12/10/2020 1043   ALBUMIN 3.8 12/10/2020 1043    RADIOGRAPHIC STUDIES: No results found.  PERFORMANCE STATUS (ECOG) : 1 - Symptomatic but completely ambulatory  Review of Systems Unless otherwise noted, a complete review of systems is negative.  Physical Exam General: NAD Cardiovascular: regular rate and rhythm Pulmonary: clear anterior/posterior fields Abdomen: soft, nontender, + bowel sounds GU: no suprapubic tenderness Extremities: no edema, no joint deformities Skin: no rashes Neurological: Weakness but otherwise nonfocal  Assessment and Plan- Patient is a 40 y.o. female stage IV metastatic breast cancer with bone metastases on systemic chemotherapy who presents to [facility] for evaluation of numbness and tingling to bilateral legs   Neuropathic pain/cramping -CBC and CMP grossly normal.  Suspect neuropathic symptoms are secondary to previous Taxol although Perjeta can also cause myalgias/arthralgias and patient is not incredibly descriptive in her characterization of this pain phenomenon.  I am not suspicious of DVT as there is no swelling and patient has negative Bevelyn Buckles' sign and symptoms are also bilateral.  She is on gabapentin 600 mg 3  times daily.  We will start her on duloxetine 30 mg daily.  Continue oxycodone/OxyContin. Recommended acupuncture and nonpharmacological strategies including massage/capsaicin.  If symptoms worsening or no improvement, could consider referral to Dr. Mickeal Skinner.  Otherwise, patient to follow-up with Dr. Janese Banks on 12/31/2020 or sooner in [facility] if needed.   Patient expressed understanding and was in agreement with this plan. She also understands that She can call clinic at any time with any questions, concerns, or complaints.   Thank you for allowing me to participate in the care of this very pleasant patient.  Time Total: 30 minutes  Visit consisted of counseling and education dealing with the complex and emotionally intense issues of symptom management and palliative care in the setting of serious and potentially life-threatening illness.Greater than 50%  of this time was spent counseling and coordinating care related to the above assessment and plan.  Signed by: Altha Harm, PhD, NP-C

## 2020-12-23 NOTE — Progress Notes (Signed)
Pt complains of bilateral lower leg pain to shin areas, which is ongoing. She describes the pain as "tingling" and states "it feels like I've been running".

## 2020-12-30 ENCOUNTER — Other Ambulatory Visit: Payer: Self-pay

## 2020-12-30 DIAGNOSIS — C50919 Malignant neoplasm of unspecified site of unspecified female breast: Secondary | ICD-10-CM

## 2020-12-31 ENCOUNTER — Other Ambulatory Visit: Payer: Self-pay

## 2020-12-31 ENCOUNTER — Encounter: Payer: Self-pay | Admitting: Oncology

## 2020-12-31 ENCOUNTER — Inpatient Hospital Stay (HOSPITAL_BASED_OUTPATIENT_CLINIC_OR_DEPARTMENT_OTHER): Payer: Self-pay | Admitting: Oncology

## 2020-12-31 ENCOUNTER — Inpatient Hospital Stay: Payer: Self-pay

## 2020-12-31 VITALS — BP 124/83 | HR 93 | Temp 98.3°F | Resp 16 | Wt 134.2 lb

## 2020-12-31 VITALS — BP 125/83 | HR 62

## 2020-12-31 DIAGNOSIS — Z5111 Encounter for antineoplastic chemotherapy: Secondary | ICD-10-CM

## 2020-12-31 DIAGNOSIS — Z5112 Encounter for antineoplastic immunotherapy: Secondary | ICD-10-CM

## 2020-12-31 DIAGNOSIS — C50919 Malignant neoplasm of unspecified site of unspecified female breast: Secondary | ICD-10-CM

## 2020-12-31 DIAGNOSIS — Z5181 Encounter for therapeutic drug level monitoring: Secondary | ICD-10-CM

## 2020-12-31 DIAGNOSIS — Z79899 Other long term (current) drug therapy: Secondary | ICD-10-CM

## 2020-12-31 DIAGNOSIS — M8440XA Pathological fracture, unspecified site, initial encounter for fracture: Secondary | ICD-10-CM

## 2020-12-31 DIAGNOSIS — C7951 Secondary malignant neoplasm of bone: Secondary | ICD-10-CM

## 2020-12-31 DIAGNOSIS — C50912 Malignant neoplasm of unspecified site of left female breast: Secondary | ICD-10-CM

## 2020-12-31 LAB — COMPREHENSIVE METABOLIC PANEL
ALT: 52 U/L — ABNORMAL HIGH (ref 0–44)
AST: 37 U/L (ref 15–41)
Albumin: 3.8 g/dL (ref 3.5–5.0)
Alkaline Phosphatase: 104 U/L (ref 38–126)
Anion gap: 9 (ref 5–15)
BUN: 11 mg/dL (ref 6–20)
CO2: 24 mmol/L (ref 22–32)
Calcium: 8.9 mg/dL (ref 8.9–10.3)
Chloride: 103 mmol/L (ref 98–111)
Creatinine, Ser: 0.53 mg/dL (ref 0.44–1.00)
GFR, Estimated: >60 mL/min (ref >60)
Glucose, Bld: 124 mg/dL — ABNORMAL HIGH (ref 70–99)
Potassium: 3.4 mmol/L — ABNORMAL LOW (ref 3.5–5.1)
Sodium: 136 mmol/L (ref 135–145)
Total Bilirubin: 0.3 mg/dL (ref 0.3–1.2)
Total Protein: 7.5 g/dL (ref 6.5–8.1)

## 2020-12-31 LAB — CBC WITH DIFFERENTIAL/PLATELET
Abs Immature Granulocytes: 0.04 10*3/uL (ref 0.00–0.07)
Basophils Absolute: 0 10*3/uL (ref 0.0–0.1)
Basophils Relative: 1 %
Eosinophils Absolute: 0.1 10*3/uL (ref 0.0–0.5)
Eosinophils Relative: 2 %
HCT: 39.2 % (ref 36.0–46.0)
Hemoglobin: 13 g/dL (ref 12.0–15.0)
Immature Granulocytes: 1 %
Lymphocytes Relative: 24 %
Lymphs Abs: 1.6 10*3/uL (ref 0.7–4.0)
MCH: 28.2 pg (ref 26.0–34.0)
MCHC: 33.2 g/dL (ref 30.0–36.0)
MCV: 85 fL (ref 80.0–100.0)
Monocytes Absolute: 0.3 10*3/uL (ref 0.1–1.0)
Monocytes Relative: 5 %
Neutro Abs: 4.4 10*3/uL (ref 1.7–7.7)
Neutrophils Relative %: 67 %
Platelets: 351 10*3/uL (ref 150–400)
RBC: 4.61 MIL/uL (ref 3.87–5.11)
RDW: 17 % — ABNORMAL HIGH (ref 11.5–15.5)
WBC: 6.5 10*3/uL (ref 4.0–10.5)
nRBC: 0 % (ref 0.0–0.2)

## 2020-12-31 MED ORDER — TRASTUZUMAB-ANNS CHEMO 150 MG IV SOLR
6.0000 mg/kg | Freq: Once | INTRAVENOUS | Status: AC
  Administered 2020-12-31: 357 mg via INTRAVENOUS
  Filled 2020-12-31: qty 17

## 2020-12-31 MED ORDER — SODIUM CHLORIDE 0.9 % IV SOLN
420.0000 mg | Freq: Once | INTRAVENOUS | Status: AC
  Administered 2020-12-31: 420 mg via INTRAVENOUS
  Filled 2020-12-31: qty 14

## 2020-12-31 MED ORDER — SODIUM CHLORIDE 0.9 % IV SOLN
Freq: Once | INTRAVENOUS | Status: AC
  Filled 2020-12-31: qty 250

## 2020-12-31 MED ORDER — HEPARIN SOD (PORK) LOCK FLUSH 100 UNIT/ML IV SOLN
500.0000 [IU] | Freq: Once | INTRAVENOUS | Status: AC | PRN
  Administered 2020-12-31: 500 [IU]
  Filled 2020-12-31: qty 5

## 2020-12-31 MED ORDER — DIPHENHYDRAMINE HCL 50 MG/ML IJ SOLN
50.0000 mg | Freq: Once | INTRAMUSCULAR | Status: AC
  Administered 2020-12-31: 50 mg via INTRAVENOUS
  Filled 2020-12-31: qty 1

## 2020-12-31 MED ORDER — ACETAMINOPHEN 325 MG PO TABS
650.0000 mg | ORAL_TABLET | Freq: Once | ORAL | Status: AC
  Administered 2020-12-31: 650 mg via ORAL
  Filled 2020-12-31: qty 2

## 2020-12-31 MED ORDER — DENOSUMAB 120 MG/1.7ML ~~LOC~~ SOLN
120.0000 mg | Freq: Once | SUBCUTANEOUS | Status: AC
  Administered 2020-12-31: 120 mg via SUBCUTANEOUS
  Filled 2020-12-31: qty 1.7

## 2020-12-31 NOTE — Patient Instructions (Signed)
[facility] [place] [facility]  Discharge Instructions: Thank you for choosing [facility] to provide your oncology and hematology care.  If you have a lab appointment with the Cancer Center, please go directly to the Cancer Center and check in at the registration area.  Wear comfortable clothing and clothing appropriate for easy access to any Portacath or PICC line.   We strive to give you quality time with your provider. You may need to reschedule your appointment if you arrive late (15 or more minutes).  Arriving late affects you and other patients whose appointments are after yours.  Also, if you miss three or more appointments without notifying the office, you may be dismissed from the clinic at the provider's discretion.      For prescription refill requests, have your pharmacy contact our office and allow 72 hours for refills to be completed.    Today you received the following chemotherapy and/or immunotherapy agents       To help prevent nausea and vomiting after your treatment, we encourage you to take your nausea medication as directed.  BELOW ARE SYMPTOMS THAT SHOULD BE REPORTED IMMEDIATELY: *FEVER GREATER THAN 100.4 F (38 C) OR HIGHER *CHILLS OR SWEATING *NAUSEA AND VOMITING THAT IS NOT CONTROLLED WITH YOUR NAUSEA MEDICATION *UNUSUAL SHORTNESS OF BREATH *UNUSUAL BRUISING OR BLEEDING *URINARY PROBLEMS (pain or burning when urinating, or frequent urination) *BOWEL PROBLEMS (unusual diarrhea, constipation, pain near the anus) TENDERNESS IN MOUTH AND THROAT WITH OR WITHOUT PRESENCE OF ULCERS (sore throat, sores in mouth, or a toothache) UNUSUAL RASH, SWELLING OR PAIN  UNUSUAL VAGINAL DISCHARGE OR ITCHING   Items with * indicate a potential emergency and should be followed up as soon as possible or go to the Emergency Department if any problems should occur.  Please show the CHEMOTHERAPY ALERT CARD or IMMUNOTHERAPY ALERT CARD at check-in to the  Emergency Department and triage nurse.  Should you have questions after your visit or need to cancel or reschedule your appointment, please contact [facility] [place] [facility]  [phone] and follow the prompts.  Office hours are 8:00 a.m. to 4:30 p.m. Monday - Friday. Please note that voicemails left after 4:00 p.m. may not be returned until the following business day.  We are closed weekends and major holidays. You have access to a nurse at all times for urgent questions. Please call the main number to the clinic [phone] and follow the prompts.  For any non-urgent questions, you may also contact your provider using MyChart. We now offer e-Visits for anyone 18 and older to request care online for non-urgent symptoms. For details visit [email or web address].   Also download the MyChart app! Go to the app store, search "MyChart", open the app, select [facility], and log in with your MyChart username and password.  Due to Covid, a mask is required upon entering the hospital/clinic. If you do not have a mask, one will be given to you upon arrival. For doctor visits, patients may have 1 support person aged 18 or older with them. For treatment visits, patients cannot have anyone with them due to current Covid guidelines and our immunocompromised population.  

## 2020-12-31 NOTE — Progress Notes (Signed)
Pt states that the rt side of her face and at times she wakes up with her face swollen, feeling like someone hit her in the face. Pain level will be 7/10.

## 2021-01-03 ENCOUNTER — Encounter: Payer: Self-pay | Admitting: Oncology

## 2021-01-03 NOTE — Progress Notes (Signed)
Hematology/Oncology Consult note [facility]  Telephone:([phone] [phone]  Patient Care Team: Center, [place], NP as PCP - General Rico Junker, RN as Registered Nurse Theodore Demark, RN as Registered Nurse Sindy Guadeloupe, MD as Consulting Physician (Hematology and Oncology)   Name of the patient: Sophia [name]  [id number]  Jul 30, 1980   Date of visit: 01/03/21  Diagnosis- stage IV metastatic breast cancer ER/PR negative HER-2/neu positive with bone metastases    Chief complaint/ Reason for visit-on treatment assessment prior to cycle 22 of Herceptin and Perjeta  Heme/Onc history: patient is a 40 year old Hispanic female who is here with her friend.  History obtained with the help of an interpreter.Patient self palpated left breast mass which was followed by a diagnostic bilateral mammogram.  Mammogram showed 3.1 x 2.9 x 1.9 cm hypoechoic mass at the 1 o'clock position of the left breast.  For abnormal cortically thickened left axillary lymph nodes measuring up to 5 mm.  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  Patient was also having ongoing back pain and was seen by [facility] orthopedics Dr. Doyle Askew who ordered MRI lumbar spine without contrast which showed possible pathologic fractures of L1 and L4 vertebral bodies with greater than 50% height loss at L1 and abnormal signal involving L2-L3 S1 as well as right iliac bone concerning for metastatic disease.  Patient is a single mother of 3 adult children and is very anxious today.  She reports significant back pain which radiates to her bilateral thighs.  Denies any focal tingling numbness or weakness.  Denies any bowel bladder incontinence.  Pain has been uncontrolled despite taking Tylenol.  No prior history of abnormal breast biopsies.  No family history of breast  cancer   PET and MRI showed 3 areas of pathologic fracture of her spine as well as widespread bony metastatic disease and concern for impending fracture of the right hip.  Given her worsening pain she was asked to come to the ER.  She has been evaluated by Dr. Rudene Christians from orthopedic surgery and underwent kyphoplasty at 3 different levels. T6 L1 and L4 along with radiofrequency ablation.    She also underwent prophylactic fixation of the right hip and not affected the sacral region.     Patient received first dose of Herceptin and Perjeta on 09/04/2019.  Baseline echocardiogram normal.she is currently getting taxol/ herceptin/perjeta   Patient admitted to hospital for acute abdominal pain with CT findings concerning for acute colitis.  Colonoscopy showed diffuse severe inflammation with erythema friability and loss of vascularity and shallow ulcerations in the IC valve, ascending colon and cecum.  Interval history-she has chronic right hip pain.  Reports heaviness in her bilateral lower extremities.  ECOG PS- 1 Pain scale- 3   Review of systems- Review of Systems  Constitutional:  Positive for malaise/fatigue. Negative for chills, fever and weight loss.  HENT:  Negative for congestion, ear discharge and nosebleeds.   Eyes:  Negative for blurred vision.  Respiratory:  Negative for cough, hemoptysis, sputum production, shortness of breath and wheezing.   Cardiovascular:  Negative for chest pain, palpitations, orthopnea and claudication.  Gastrointestinal:  Negative for abdominal pain, blood in stool, constipation, diarrhea, heartburn, melena, nausea and vomiting.  Genitourinary:  Negative for dysuria, flank pain, frequency, hematuria and urgency.  Musculoskeletal:  Negative for back  pain, joint pain and myalgias.       Right leg pain  Skin:  Negative for rash.  Neurological:  Negative for dizziness, tingling, focal weakness, seizures, weakness and headaches.  Endo/Heme/Allergies:  Does not  bruise/bleed easily.  Psychiatric/Behavioral:  Negative for depression and suicidal ideas. The patient does not have insomnia.     No Known Allergies   Past Medical History:  Diagnosis Date   Anxiety    Breast cancer ([place])    with mets   Cancer ([place])    Colitis    COVID-19 in immunocompromised patient ([place])    Family history of colon cancer    Vertigo      Past Surgical History:  Procedure Laterality Date   BREAST BIOPSY Left 08/14/2019   [place] bx of mass, coil marker, path pending   BREAST BIOPSY Left 08/14/2019   [place] bx of LN, hydromarker, path pending   BREAST BIOPSY Left 08/14/2019   affirm bx of calcs, x marker, path pending   ESOPHAGOGASTRODUODENOSCOPY (EGD) WITH PROPOFOL N/A 10/05/2019   Procedure: ESOPHAGOGASTRODUODENOSCOPY (EGD) WITH PROPOFOL;  Surgeon: Lin Landsman, MD;  Location: [facility];  Service: Gastroenterology;  Laterality: N/A;   FLEXIBLE SIGMOIDOSCOPY N/A 10/05/2019   Procedure: FLEXIBLE SIGMOIDOSCOPY;  Surgeon: Lin Landsman, MD;  Location: [facility] ENDOSCOPY;  Service: Gastroenterology;  Laterality: N/A;   INTRAMEDULLARY (IM) NAIL INTERTROCHANTERIC Right 09/01/2019   Procedure: INTRAMEDULLARY (IM) NAIL INTERTROCHANTRIC AND RADIOFREQUENCY ABLATION;  Surgeon: Hessie Knows, MD;  Location: [facility] ORS;  Service: Orthopedics;  Laterality: Right;   KYPHOPLASTY N/A 08/29/2019   Procedure: KYPHOPLASTY T6, L1,L4 ,  RADIOFREQUENCY ABLATION;  Surgeon: Hessie Knows, MD;  Location: [facility] ORS;  Service: Orthopedics;  Laterality: N/A;   KYPHOPLASTY Right 09/01/2019   Procedure: Right Sacral Radiofrequency Ablation and Cement Augmentation, Right sacrum and iliac crest;  Surgeon: Hessie Knows, MD;  Location: [facility] ORS;  Service: Orthopedics;  Laterality: Right;   PORTA CATH INSERTION N/A 08/28/2019   Procedure: PORTA CATH INSERTION;  Surgeon: Algernon Huxley, MD;  Location: [facility] LAB;  Service: Cardiovascular;  Laterality: N/A;    Social History    Socioeconomic History   Marital status: Single    Spouse name: Not on file   Number of children: Not on file   Years of education: Not on file   Highest education level: Not on file  Occupational History   Not on file  Tobacco Use   Smoking status: Never   Smokeless tobacco: Never  Vaping Use   Vaping Use: Never used  Substance and Sexual Activity   Alcohol use: Not Currently   Drug use: Not Currently   Sexual activity: Not Currently    Birth control/protection: None  Other Topics Concern   Not on file  Social History Narrative   Lives at home with children   Social Determinants of Health   Financial Resource Strain: Not on file  Food Insecurity: Not on file  Transportation Needs: Not on file  Physical Activity: Not on file  Stress: Not on file  Social Connections: Not on file  Intimate Partner Violence: Not on file    Family History  Problem Relation Age of Onset   Colon cancer Maternal Uncle      Current Outpatient Medications:    calcium-vitamin D (OSCAL WITH D) 500-200 MG-UNIT tablet, Take 1 tablet by mouth 2 (two) times daily., Disp: 30 tablet, Rfl: 0   ergocalciferol (VITAMIN D2) 1.25 MG (50000 UT) capsule, Take 1 capsule (50,000 Units  total) by mouth once a week. For 4 weeks. Then 1 capsule once monthly for 4 months, Disp: 8 capsule, Rfl: 0   gabapentin (NEURONTIN) 600 MG tablet, Take 1 tablet (600 mg total) by mouth 3 (three) times daily., Disp: 90 tablet, Rfl: 3   lidocaine-prilocaine (EMLA) cream, Apply 1 application topically as needed. Apply small amount to port site at least 1 hour prior to it being accessed, cover with plastic wrap, Disp: 30 g, Rfl: 1   oxycodone (OXY-IR) 5 MG capsule, Take 1-2 capsules (5-10 mg total) by mouth every 4 (four) hours as needed., Disp: 90 capsule, Rfl: 0   oxyCODONE (OXYCONTIN) 10 mg 12 hr tablet, Take 1 tablet (10 mg total) by mouth every 12 (twelve) hours., Disp: 60 tablet, Rfl: 0   albuterol (VENTOLIN HFA) 108 (90  Base) MCG/ACT inhaler, Inhale 2 puffs into the lungs every 6 (six) hours as needed for wheezing or shortness of breath. (Patient not taking: No sig reported), Disp: 8 g, Rfl: 2   cyclobenzaprine (FLEXERIL) 5 MG tablet, Take 1 tablet (5 mg total) by mouth 3 (three) times daily as needed for muscle spasms. (Patient not taking: No sig reported), Disp: 30 tablet, Rfl: 0   diphenoxylate-atropine (LOMOTIL) 2.5-0.025 MG tablet, Take 1 tablet by mouth 4 (four) times daily as needed for diarrhea or loose stools. (Patient not taking: No sig reported), Disp: 40 tablet, Rfl: 0   DULoxetine (CYMBALTA) 30 MG capsule, Take 1 capsule (30 mg total) by mouth daily. (Patient not taking: Reported on 12/31/2020), Disp: 30 capsule, Rfl: 3   ondansetron (ZOFRAN) 8 MG tablet, Take 1 tablet (8 mg total) by mouth every 8 (eight) hours as needed for nausea or vomiting. (Patient not taking: No sig reported), Disp: 45 tablet, Rfl: 2   potassium chloride SA (KLOR-CON) 20 MEQ tablet, Take 1 tablet (20 mEq total) by mouth daily for 7 days. (Patient not taking: No sig reported), Disp: 7 tablet, Rfl: 0   prochlorperazine (COMPAZINE) 10 MG tablet, Take 1 tablet (10 mg total) by mouth every 6 (six) hours as needed for nausea or vomiting. (Patient not taking: No sig reported), Disp: 45 tablet, Rfl: 2 No current facility-administered medications for this visit.  Facility-Administered Medications Ordered in Other Visits:    0.9 %  sodium chloride infusion, , Intravenous, Continuous, Burns, Wandra Feinstein, NP, Stopped at 06/02/20 1453   heparin lock flush 100 unit/mL, 500 Units, Intravenous, Once, Sindy Guadeloupe, MD   heparin lock flush 100 unit/mL, 500 Units, Intravenous, Once, Sindy Guadeloupe, MD   sodium chloride flush (NS) 0.9 % injection 10 mL, 10 mL, Intravenous, Once, Sindy Guadeloupe, MD   sodium chloride flush (NS) 0.9 % injection 10 mL, 10 mL, Intravenous, Once, Sindy Guadeloupe, MD  Physical exam:  Vitals:   12/31/20 1003  BP: 124/83   Pulse: 93  Resp: 16  Temp: 98.3 F (36.8 C)  SpO2: 100%  Weight: 134 lb 3.2 oz (60.9 kg)   Physical Exam Constitutional:      General: She is not in acute distress. Eyes:     Comments: Bilateral lacrimal sacs appear prominent  Cardiovascular:     Rate and Rhythm: Normal rate and regular rhythm.     Heart sounds: Normal heart sounds.  Pulmonary:     Effort: Pulmonary effort is normal.     Breath sounds: Normal breath sounds.  Abdominal:     General: Bowel sounds are normal.     Palpations: Abdomen is  soft.  Skin:    General: Skin is warm and dry.  Neurological:     Mental Status: She is alert and oriented to person, place, and time.     CMP Latest Ref Rng & Units 12/31/2020  Glucose 70 - 99 mg/dL 124(H)  BUN 6 - 20 mg/dL 11  Creatinine 0.44 - 1.00 mg/dL 0.53  Sodium 135 - 145 mmol/L 136  Potassium 3.5 - 5.1 mmol/L 3.4(L)  Chloride 98 - 111 mmol/L 103  CO2 22 - 32 mmol/L 24  Calcium 8.9 - 10.3 mg/dL 8.9  Total Protein 6.5 - 8.1 g/dL 7.5  Total Bilirubin 0.3 - 1.2 mg/dL 0.3  Alkaline Phos 38 - 126 U/L 104  AST 15 - 41 U/L 37  ALT 0 - 44 U/L 52(H)   CBC Latest Ref Rng & Units 12/31/2020  WBC 4.0 - 10.5 K/uL 6.5  Hemoglobin 12.0 - 15.0 g/dL 13.0  Hematocrit 36.0 - 46.0 % 39.2  Platelets 150 - 400 K/uL 351     Assessment and plan- Patient is a 40 y.o. female  with ER/PR negative HER2 positive metastatic breast cancer with bone metastases.  She is here for on treatment assessment prior to cycle 23 of Herceptin and Perjeta  Counts okay to proceed with cycle 23 of Herceptin and Perjeta today. I will see her back in 3 weeks for cycle 24.  Will obtain CT chest abdomen pelvis with contrast and bone scan prior.  Patient will also get Xgeva today.  Next dose again in 6 weeks.  So far patient has responded well to treatment with stable areas of bone metastasis and remains on Herceptin and Perjeta maintenance.  No role for hormone therapy as she is estrogen negative.  She will  also be getting a repeat echocardiogram this month   Visit Diagnosis 1. Metastatic breast cancer ([place])   2. Encounter for monoclonal antibody treatment for malignancy   3. Encounter for monitoring denosumab therapy      Dr. Randa Evens, MD, MPH [facility] [phone] 01/03/2021 1:02 PM

## 2021-01-05 ENCOUNTER — Other Ambulatory Visit: Payer: Self-pay

## 2021-01-05 ENCOUNTER — Ambulatory Visit
Admission: RE | Admit: 2021-01-05 | Discharge: 2021-01-05 | Disposition: A | Discharge status: Home / Self Care | Payer: Self-pay | Source: Ambulatory Visit | Attending: Oncology | Admitting: Oncology

## 2021-01-05 DIAGNOSIS — Z01810 Encounter for preprocedural cardiovascular examination: Secondary | ICD-10-CM | POA: Insufficient documentation

## 2021-01-05 DIAGNOSIS — C50919 Malignant neoplasm of unspecified site of unspecified female breast: Secondary | ICD-10-CM | POA: Insufficient documentation

## 2021-01-05 DIAGNOSIS — Z8616 Personal history of COVID-19: Secondary | ICD-10-CM | POA: Insufficient documentation

## 2021-01-05 DIAGNOSIS — Z0189 Encounter for other specified special examinations: Secondary | ICD-10-CM

## 2021-01-05 LAB — ECHOCARDIOGRAM COMPLETE
AR max vel: 2.08 cm2
AV Area VTI: 2.22 cm2
AV Area mean vel: 1.94 cm2
AV Mean grad: 3 mmHg
AV Peak grad: 5.9 mmHg
Ao pk vel: 1.21 m/s
Area-P 1/2: 5.34 cm2
S' Lateral: 2.33 cm

## 2021-01-05 NOTE — Progress Notes (Signed)
*  PRELIMINARY RESULTS* Echocardiogram 2D Echocardiogram has been performed.  Sophia [name] 01/05/2021, 11:10 AM

## 2021-01-19 ENCOUNTER — Other Ambulatory Visit: Payer: Self-pay | Admitting: *Deleted

## 2021-01-19 ENCOUNTER — Encounter
Admission: RE | Admit: 2021-01-19 | Discharge: 2021-01-19 | Disposition: A | Discharge status: Home / Self Care | Payer: Self-pay | Source: Ambulatory Visit | Attending: Oncology | Admitting: Oncology

## 2021-01-19 ENCOUNTER — Ambulatory Visit
Admission: RE | Admit: 2021-01-19 | Discharge: 2021-01-19 | Disposition: A | Discharge status: Home / Self Care | Payer: Self-pay | Source: Ambulatory Visit | Attending: Oncology | Admitting: Oncology

## 2021-01-19 ENCOUNTER — Ambulatory Visit: Admission: RE | Admit: 2021-01-19 | Payer: Self-pay | Source: Ambulatory Visit

## 2021-01-19 ENCOUNTER — Other Ambulatory Visit: Payer: Self-pay

## 2021-01-19 DIAGNOSIS — C50919 Malignant neoplasm of unspecified site of unspecified female breast: Secondary | ICD-10-CM

## 2021-01-19 IMAGING — CT CT CHEST-ABD-PELV W/ CM
3 of 5 series · 15 of 36 positions shown, 17 images · IV contrast (omnipaque)
Comparison: [DATE]

CLINICAL DATA: Metastatic breast cancer

EXAM:
CT CHEST, ABDOMEN, AND PELVIS WITH CONTRAST
TECHNIQUE: Multidetector CT imaging of the chest, abdomen and pelvis was
performed following the standard protocol during bolus
administration of intravenous contrast.
CONTRAST:  100mL OMNIPAQUE IOHEXOL 300 MG/ML  SOLN

[Series 2: cap with · axial · 0.79mm/px · z∈[-567,-87]mm · 10 of 118 slices shown, 12 images]
[im 11/118  mediastinal]
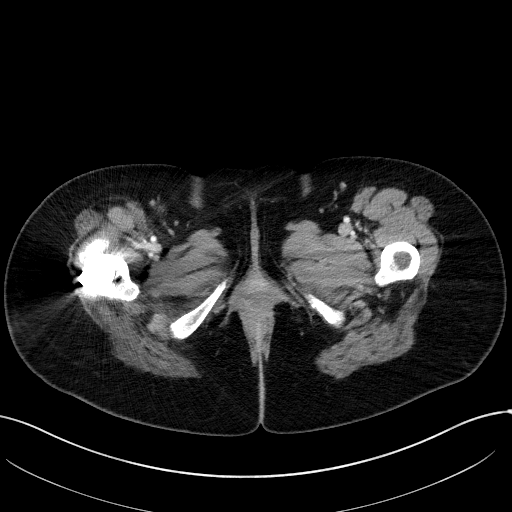
[im 11/118  bone]
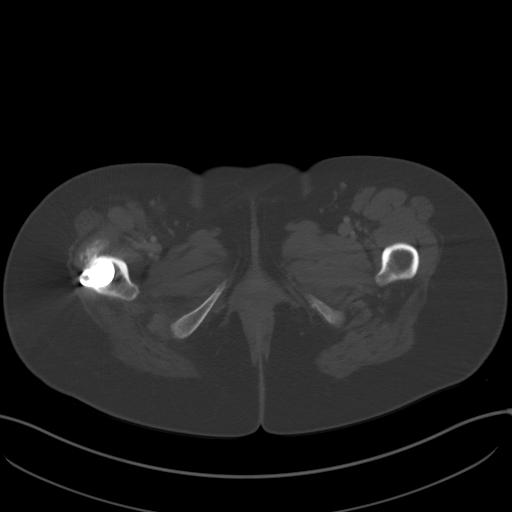
[im 22/118  mediastinal]
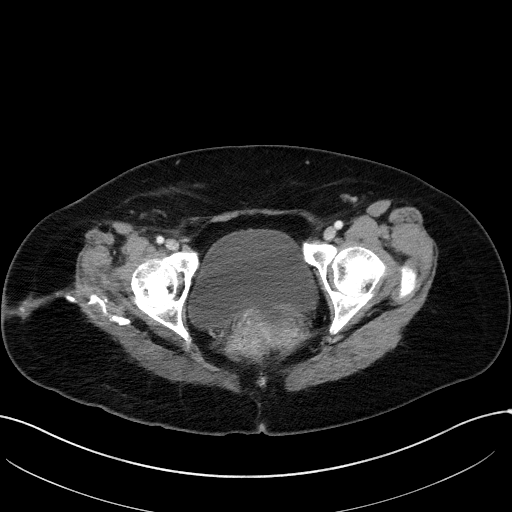
[im 32/118  mediastinal]
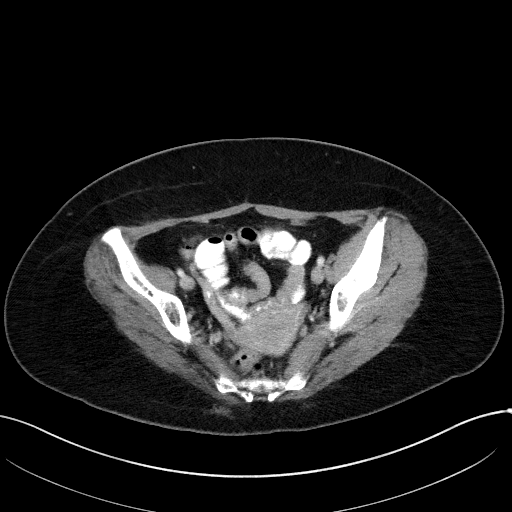
[im 43/118  mediastinal]
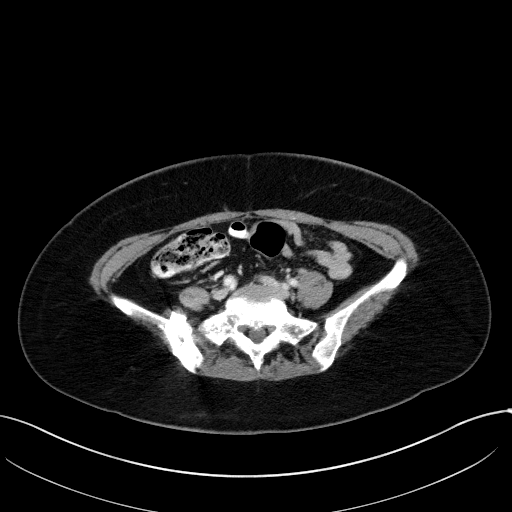
[im 54/118  mediastinal]
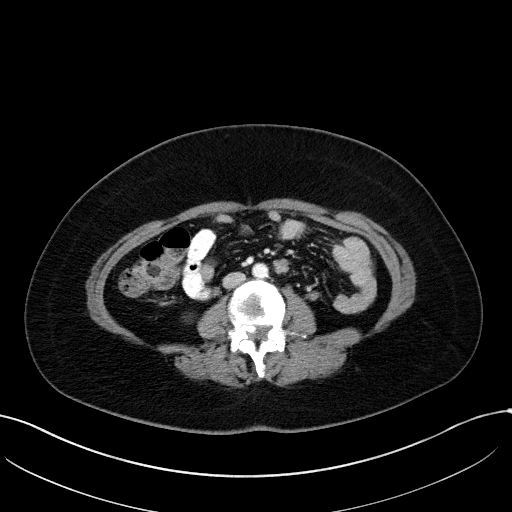
[im 64/118  mediastinal]
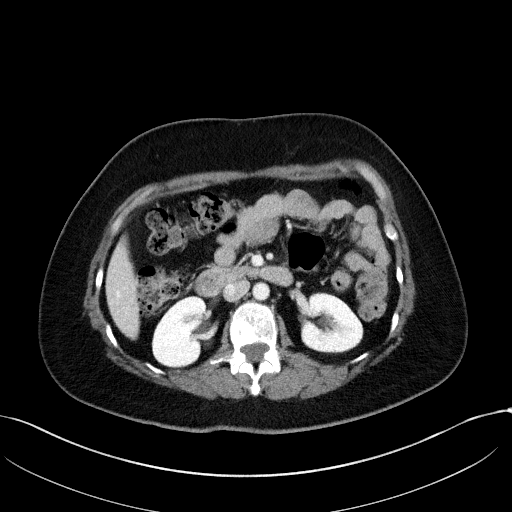
[im 75/118  mediastinal]
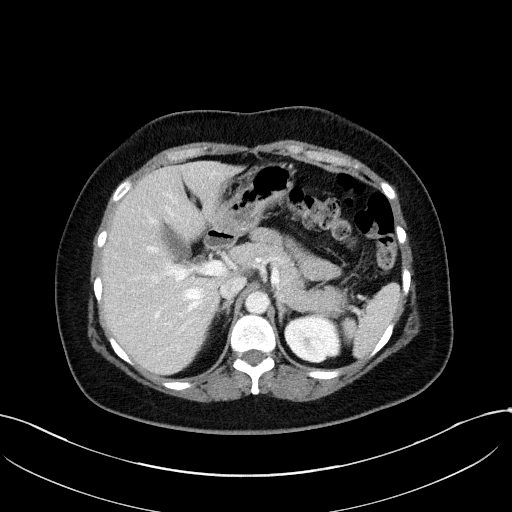
[im 86/118  mediastinal]
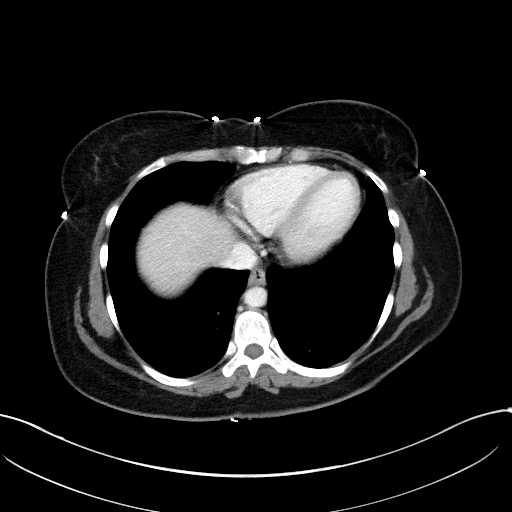
[im 96/118  mediastinal]
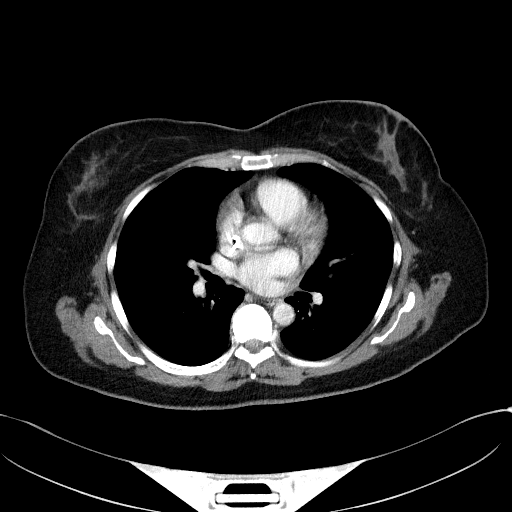
[im 96/118  bone]
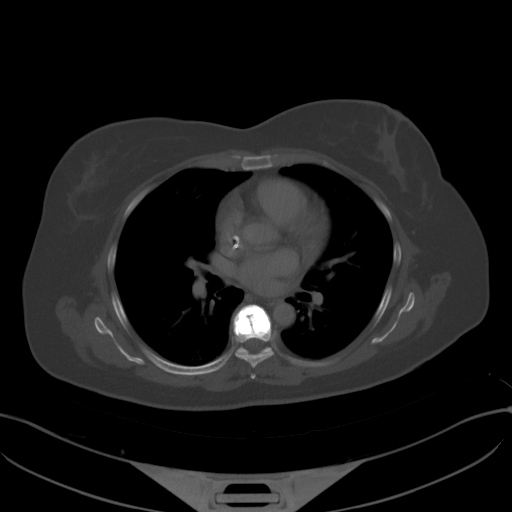
[im 107/118  mediastinal]
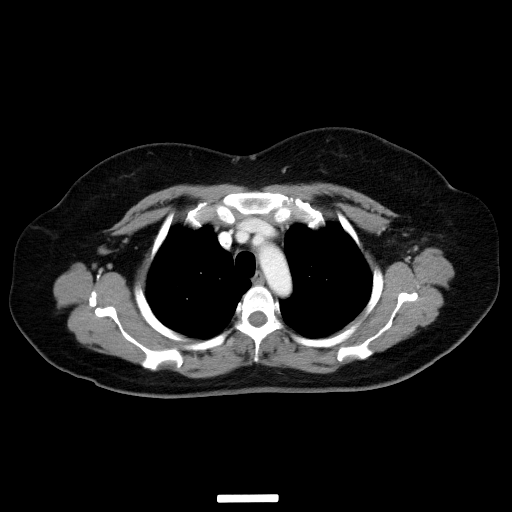

[Series 3: lung · axial · 0.79mm/px · z∈[-252,-230]mm · 2 of 122 slices shown]
[im 12/122  bone]
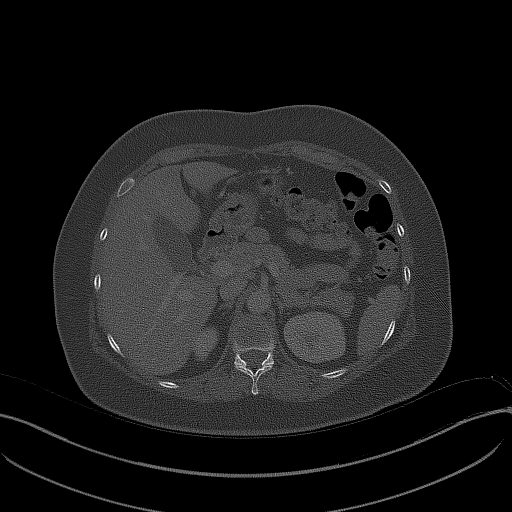
[im 23/122  bone]
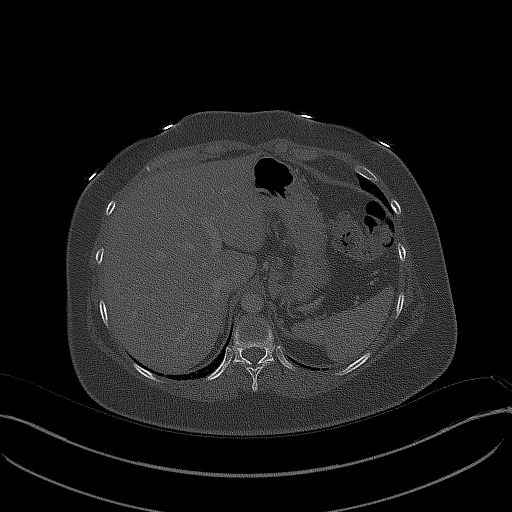

[Series 5: coronals · coronal · 0.81mm/px · 3 of 134 slices shown]
[im 27/134  mediastinal]
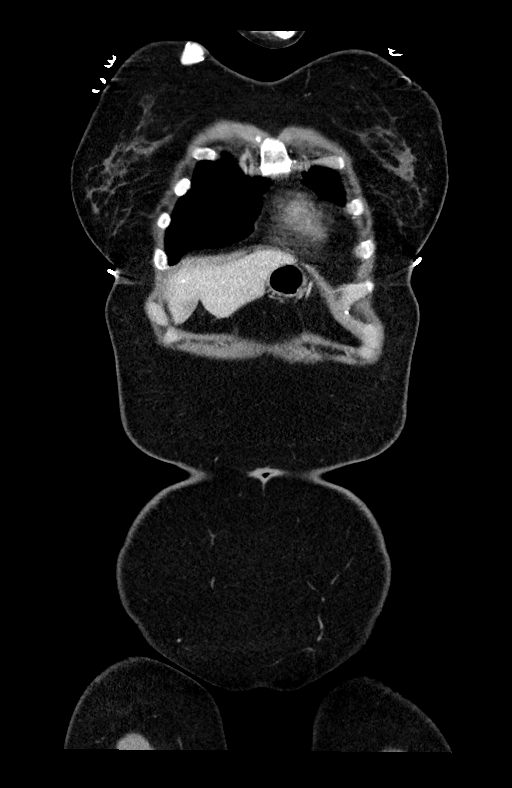
[im 54/134  mediastinal]
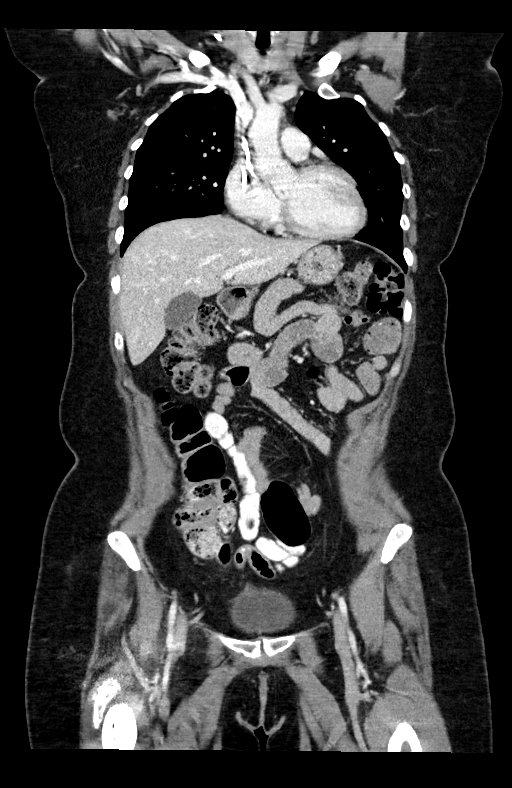
[im 80/134  mediastinal]
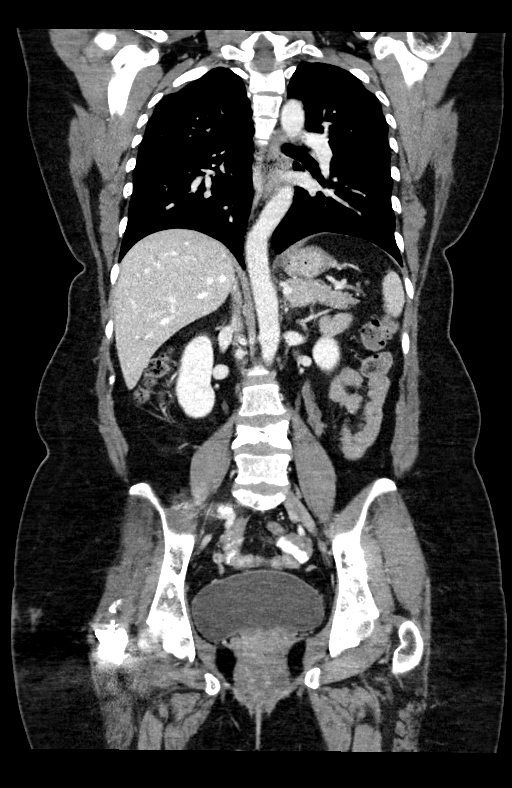

[15 of 36 positions shown; findings below may reference images not displayed]

FINDINGS: CT CHEST FINDINGS

Cardiovascular: The heart is normal in size. No pericardial
effusion.

No evidence thoracic aortic aneurysm.

Right chest port terminates at the cavoatrial junction.

Mediastinum/Nodes: No suspicious mediastinal or axillary
lymphadenopathy.

Status post left breast lumpectomy.

Visualized thyroid is unremarkable.

Lungs/Pleura: 5 mm nodule the left lung base (series 3/image 89),
unchanged.

No new/suspicious pulmonary nodules.

No focal consolidation.

No pleural effusion or pneumothorax.

Musculoskeletal: Prior vertebral augmentation at T6.

Stable scattered sclerotic osseous metastases in the visualized
axial and appendicular skeleton, grossly unchanged. Correlate
pending bone scan.

CT ABDOMEN PELVIS FINDINGS

Hepatobiliary: Liver is within normal limits. No
suspicious/enhancing hepatic lesions.

Gallbladder is unremarkable. No intrahepatic or extrahepatic ductal
dilatation.

Pancreas: Within normal limits.

Spleen: Within normal limits.

Adrenals/Urinary Tract: Adrenal glands are within normal limits.

Kidneys are within normal limits.  No hydronephrosis.

Bladder is within normal limits.

Stomach/Bowel: Stomach within normal limits.

No evidence of bowel obstruction.

Normal appendix (series 2/image 86).

No colonic wall thickening or inflammatory changes.

Vascular/Lymphatic: No evidence of abdominal aortic aneurysm.

No suspicious abdominopelvic lymphadenopathy.

Reproductive: Uterus is within normal limits.

Bilateral ovaries are within normal limits.

Other: No abdominopelvic ascites.

Musculoskeletal: Prior vertebral augmentation at L1 and L4. Prior
right sacroplasty.

Scattered sclerotic metastases in the visualized lumbar spine,
grossly unchanged. Correlate with pending bone scan.

Status post ORIF of the right proximal femur.
IMPRESSION: Status post left breast lumpectomy.

Stable sclerotic osseous metastases in the visualized axial and
appendicular skeleton, grossly unchanged. Correlate with pending
bone scan.

Otherwise, no evidence of recurrent or metastatic disease.

## 2021-01-19 IMAGING — NM NM BONE WHOLE BODY
2 series · 12 of 12 positions shown · non-contrast
Comparison: Previous studies including the examination of
[DATE]

CLINICAL DATA: Breast carcinoma

EXAM:
NUCLEAR MEDICINE WHOLE BODY BONE SCAN
TECHNIQUE: Whole body anterior and posterior images were obtained approximately
3 hours after intravenous injection of radiopharmaceutical.
RADIOPHARMACEUTICALS:  20.4 mCi [IR] MDP IV

[Series 1000: 3 hr wholebody · 2.40mm/px · 2 of 2 frames shown]
[frame 1/2]
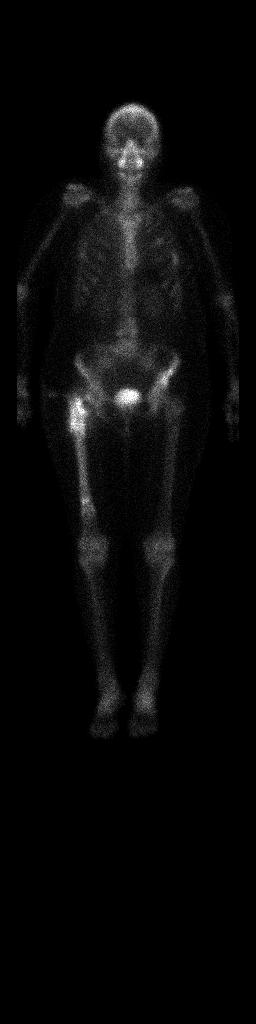
[frame 2/2]
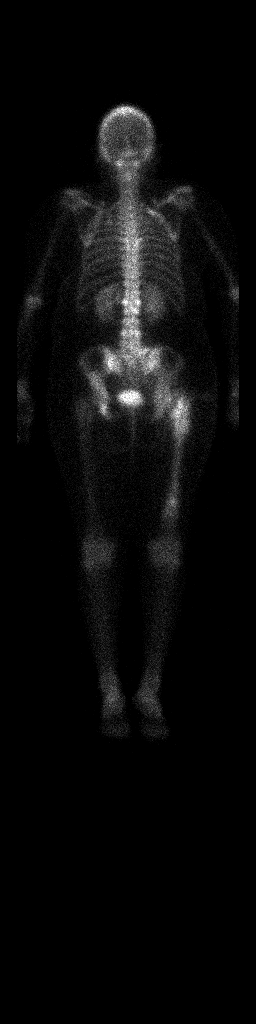

[Series 1000: statics · 2.40mm/px · 5 acquisitions, 10 frames shown]
[im 1/5]
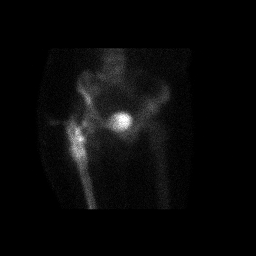
[im 1/5]
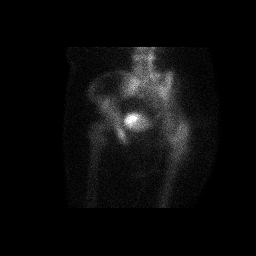
[im 2/5]
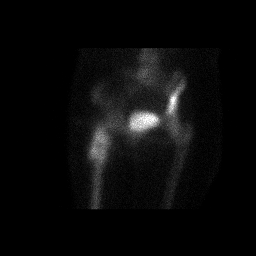
[im 2/5]
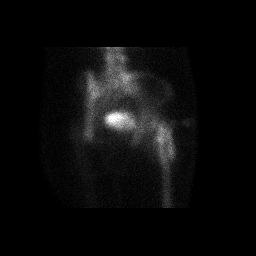
[im 3/5]
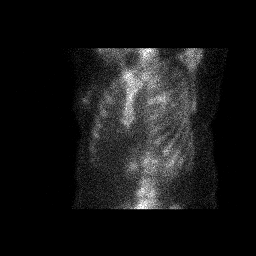
[im 3/5]
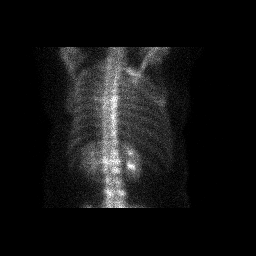
[im 4/5]
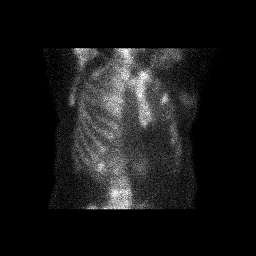
[im 4/5]
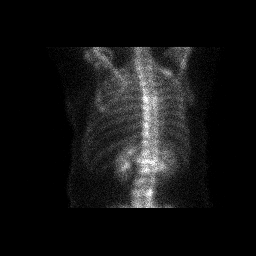
[im 5/5]
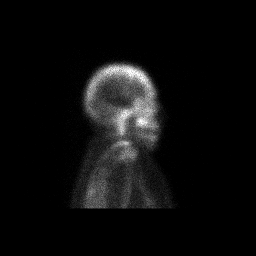
[im 5/5]
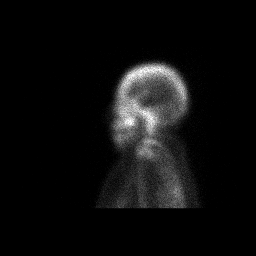

[12 of 12 positions shown; findings below may reference images not displayed]

FINDINGS: There is diffuse increased tracer uptake in the intertrochanteric
portion of right femur and proximal shaft, possibly related to to
previous internal fixation. Amount of activity along the medial
cortex of proximal shaft of right femur and in the distal shaft of
right femur since the previous examination. Focal uptake seen in
left iliac bone has not changed. There are subtle foci of uptake in
upper lumbar spine with no significant change. There is subtle
asymmetry in the left occipital condyle as not changed.
IMPRESSION: There is interval increase in amount of uptake in the medial cortex
of proximal shaft of right femur. There is subtle increased uptake
in the distal shaft of right femur. These may be related to possible
loosening of surgical hardware or metastatic disease.

No other new focal abnormal foci of uptake are noted.

## 2021-01-19 MED ORDER — TECHNETIUM TC 99M MEDRONATE IV KIT
20.0000 | PACK | Freq: Once | INTRAVENOUS | Status: AC | PRN
  Administered 2021-01-19: 20.4 via INTRAVENOUS

## 2021-01-19 MED ORDER — IOHEXOL 300 MG/ML  SOLN
100.0000 mL | Freq: Once | INTRAMUSCULAR | Status: AC | PRN
  Administered 2021-01-19: 100 mL via INTRAVENOUS

## 2021-01-20 ENCOUNTER — Encounter: Payer: Self-pay | Admitting: Oncology

## 2021-01-20 MED ORDER — OXYCODONE HCL 5 MG PO CAPS: 5.0000 mg | ORAL_CAPSULE | ORAL | 0 refills | Status: DC | PRN

## 2021-01-21 ENCOUNTER — Other Ambulatory Visit: Payer: Self-pay | Admitting: Oncology

## 2021-01-21 ENCOUNTER — Inpatient Hospital Stay: Payer: Self-pay

## 2021-01-21 ENCOUNTER — Ambulatory Visit
Admission: RE | Admit: 2021-01-21 | Discharge: 2021-01-21 | Disposition: A | Discharge status: Home / Self Care | Payer: Self-pay | Source: Ambulatory Visit | Attending: Oncology | Admitting: Oncology

## 2021-01-21 ENCOUNTER — Other Ambulatory Visit: Payer: Self-pay

## 2021-01-21 ENCOUNTER — Other Ambulatory Visit: Payer: Self-pay | Admitting: *Deleted

## 2021-01-21 ENCOUNTER — Inpatient Hospital Stay: Payer: Self-pay | Attending: Oncology

## 2021-01-21 ENCOUNTER — Inpatient Hospital Stay (HOSPITAL_BASED_OUTPATIENT_CLINIC_OR_DEPARTMENT_OTHER): Payer: Self-pay | Admitting: Oncology

## 2021-01-21 ENCOUNTER — Encounter: Payer: Self-pay | Admitting: Oncology

## 2021-01-21 VITALS — BP 112/75 | HR 72 | Resp 16

## 2021-01-21 VITALS — BP 120/83 | HR 79 | Temp 98.5°F | Resp 16 | Wt 137.8 lb

## 2021-01-21 DIAGNOSIS — Z5111 Encounter for antineoplastic chemotherapy: Secondary | ICD-10-CM

## 2021-01-21 DIAGNOSIS — Z171 Estrogen receptor negative status [ER-]: Secondary | ICD-10-CM | POA: Insufficient documentation

## 2021-01-21 DIAGNOSIS — M25579 Pain in unspecified ankle and joints of unspecified foot: Secondary | ICD-10-CM

## 2021-01-21 DIAGNOSIS — C7951 Secondary malignant neoplasm of bone: Secondary | ICD-10-CM | POA: Insufficient documentation

## 2021-01-21 DIAGNOSIS — C50912 Malignant neoplasm of unspecified site of left female breast: Secondary | ICD-10-CM

## 2021-01-21 DIAGNOSIS — C50919 Malignant neoplasm of unspecified site of unspecified female breast: Secondary | ICD-10-CM

## 2021-01-21 DIAGNOSIS — M8440XA Pathological fracture, unspecified site, initial encounter for fracture: Secondary | ICD-10-CM

## 2021-01-21 DIAGNOSIS — C50412 Malignant neoplasm of upper-outer quadrant of left female breast: Secondary | ICD-10-CM | POA: Insufficient documentation

## 2021-01-21 DIAGNOSIS — Z5112 Encounter for antineoplastic immunotherapy: Secondary | ICD-10-CM

## 2021-01-21 LAB — COMPREHENSIVE METABOLIC PANEL
ALT: 20 U/L (ref 0–44)
AST: 31 U/L (ref 15–41)
Albumin: 3.9 g/dL (ref 3.5–5.0)
Alkaline Phosphatase: 72 U/L (ref 38–126)
Anion gap: 8 (ref 5–15)
BUN: 11 mg/dL (ref 6–20)
CO2: 23 mmol/L (ref 22–32)
Calcium: 8.6 mg/dL — ABNORMAL LOW (ref 8.9–10.3)
Chloride: 104 mmol/L (ref 98–111)
Creatinine, Ser: 0.57 mg/dL (ref 0.44–1.00)
GFR, Estimated: >60 mL/min (ref >60)
Glucose, Bld: 172 mg/dL — ABNORMAL HIGH (ref 70–99)
Potassium: 3.1 mmol/L — ABNORMAL LOW (ref 3.5–5.1)
Sodium: 135 mmol/L (ref 135–145)
Total Bilirubin: 0.4 mg/dL (ref 0.3–1.2)
Total Protein: 7.5 g/dL (ref 6.5–8.1)

## 2021-01-21 LAB — CBC WITH DIFFERENTIAL/PLATELET
Abs Immature Granulocytes: 0.01 10*3/uL (ref 0.00–0.07)
Basophils Absolute: 0 10*3/uL (ref 0.0–0.1)
Basophils Relative: 1 %
Eosinophils Absolute: 0.1 10*3/uL (ref 0.0–0.5)
Eosinophils Relative: 2 %
HCT: 40.1 % (ref 36.0–46.0)
Hemoglobin: 13.3 g/dL (ref 12.0–15.0)
Immature Granulocytes: 0 %
Lymphocytes Relative: 28 %
Lymphs Abs: 1.5 10*3/uL (ref 0.7–4.0)
MCH: 28.2 pg (ref 26.0–34.0)
MCHC: 33.2 g/dL (ref 30.0–36.0)
MCV: 85.1 fL (ref 80.0–100.0)
Monocytes Absolute: 0.3 10*3/uL (ref 0.1–1.0)
Monocytes Relative: 5 %
Neutro Abs: 3.4 10*3/uL (ref 1.7–7.7)
Neutrophils Relative %: 64 %
Platelets: 311 10*3/uL (ref 150–400)
RBC: 4.71 MIL/uL (ref 3.87–5.11)
RDW: 15.1 % (ref 11.5–15.5)
WBC: 5.3 10*3/uL (ref 4.0–10.5)
nRBC: 0 % (ref 0.0–0.2)

## 2021-01-21 IMAGING — CR DG FEMUR 2+V*R*
1 series · 4 of 4 positions shown · non-contrast
Comparison: Comparison made with [DATE].

CLINICAL DATA: Pain and numbness in the RIGHT thigh. History of
breast cancer.

EXAM:
RIGHT FEMUR 2 VIEWS

[Series 1: dg femur 1v right · 0.14mm/px · 4 of 4 slices shown]
[im 1/4]
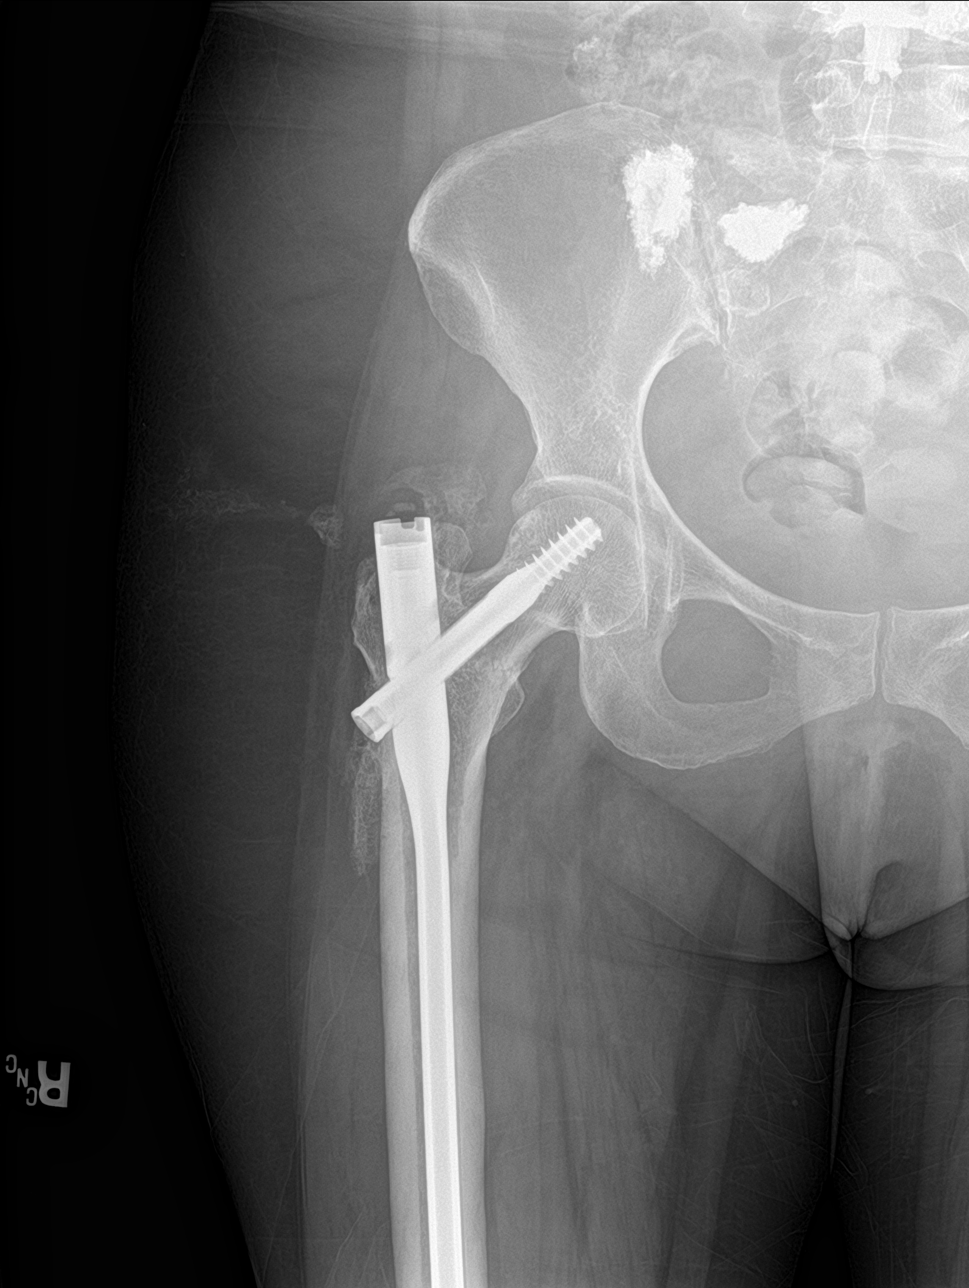
[im 2/4]
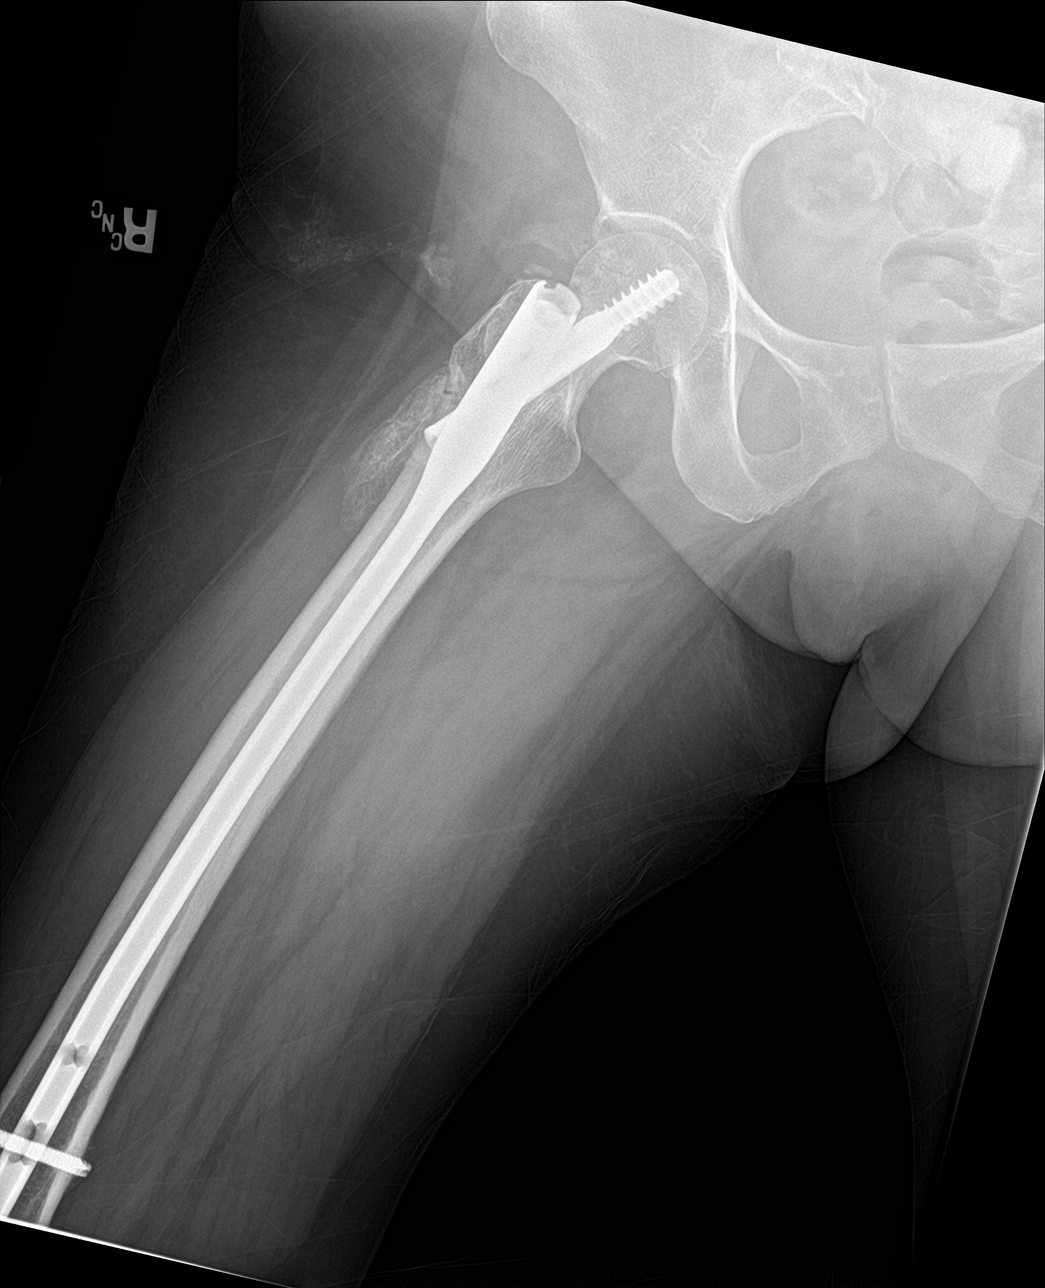
[im 3/4]
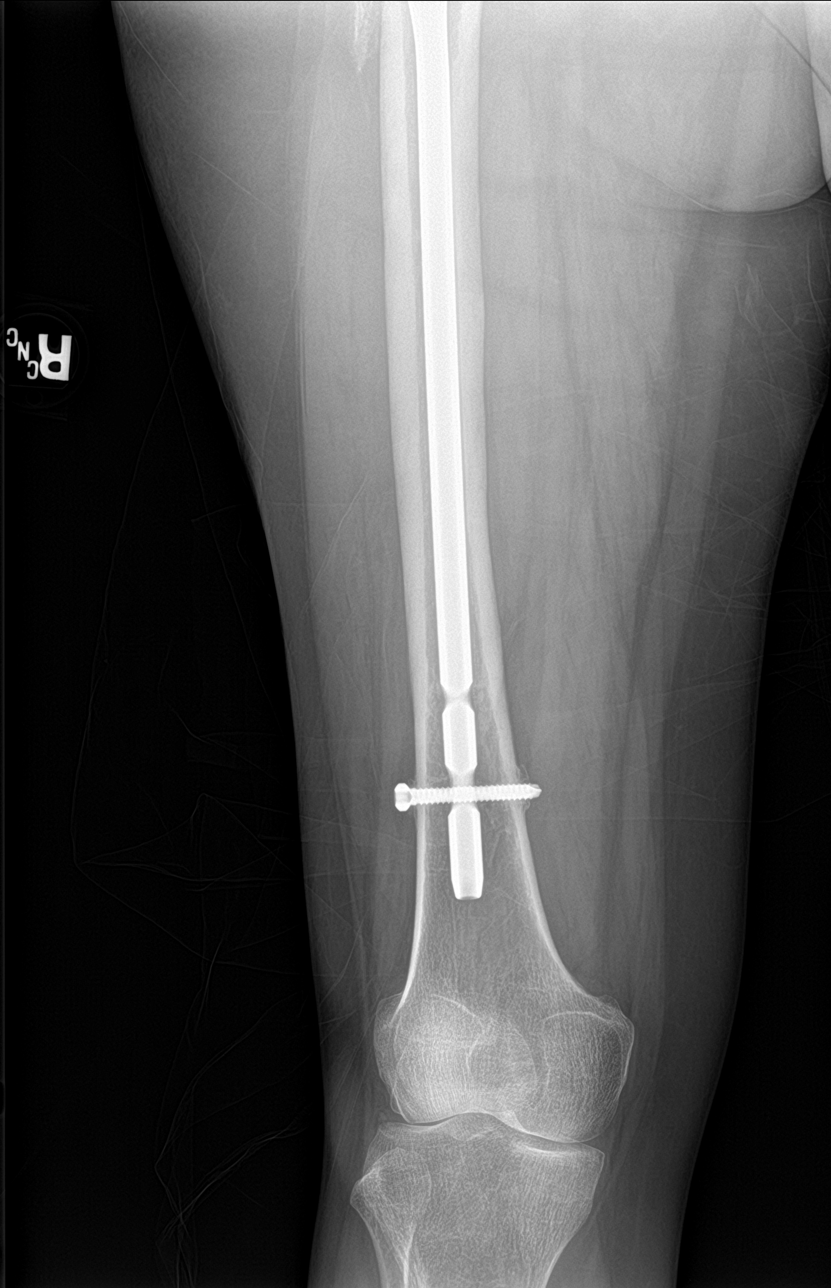
[im 4/4]
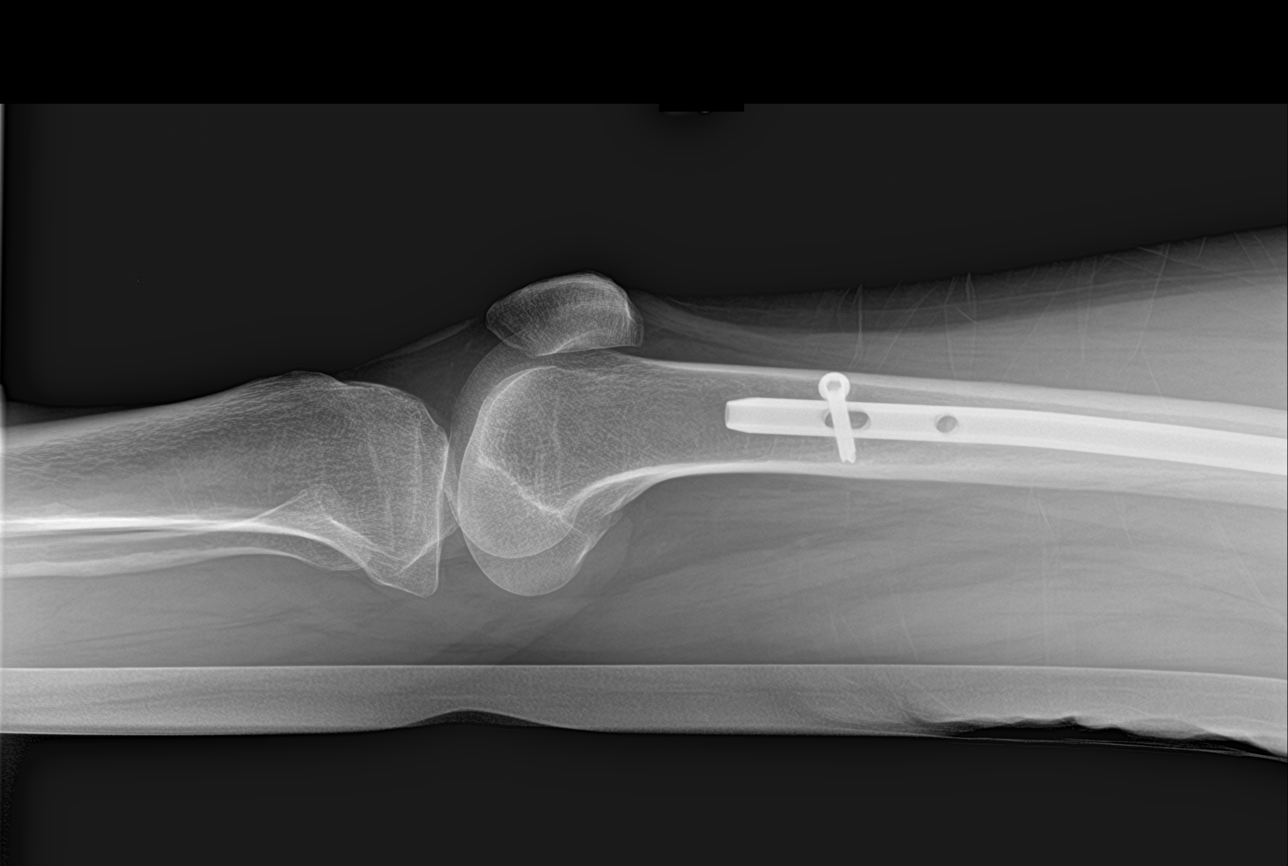

[4 of 4 positions shown; findings below may reference images not displayed]

FINDINGS: Cement augmentation related to prior sacroplasty on the RIGHT
involving iliac and sacrum.w

Post intramedullary nailing of the RIGHT femur with screw across
femoral neck. Distal interlocking screw appears well seated.

Heterotopic ossification in the soft tissues.

No sign of fracture.  No bony destruction.
IMPRESSION: Post intramedullary nailing of the RIGHT femur with screw across the
femoral neck. No complicating features.

Cement augmentation along the RIGHT iliac posteriorly and within the
sacrum.

No acute or destructive bone process.

## 2021-01-21 MED ORDER — DIPHENHYDRAMINE HCL 50 MG/ML IJ SOLN
50.0000 mg | Freq: Once | INTRAMUSCULAR | Status: AC
  Administered 2021-01-21: 50 mg via INTRAVENOUS
  Filled 2021-01-21: qty 1

## 2021-01-21 MED ORDER — SODIUM CHLORIDE 0.9 % IV SOLN
420.0000 mg | Freq: Once | INTRAVENOUS | Status: AC
  Administered 2021-01-21: 420 mg via INTRAVENOUS
  Filled 2021-01-21: qty 14

## 2021-01-21 MED ORDER — TRASTUZUMAB-ANNS CHEMO 150 MG IV SOLR
6.0000 mg/kg | Freq: Once | INTRAVENOUS | Status: AC
  Administered 2021-01-21: 357 mg via INTRAVENOUS
  Filled 2021-01-21: qty 17

## 2021-01-21 MED ORDER — SODIUM CHLORIDE 0.9 % IV SOLN
Freq: Once | INTRAVENOUS | Status: AC
  Filled 2021-01-21: qty 250

## 2021-01-21 MED ORDER — SODIUM CHLORIDE 0.9% FLUSH
10.0000 mL | INTRAVENOUS | Status: DC | PRN
  Administered 2021-01-21: 10 mL
  Filled 2021-01-21: qty 10

## 2021-01-21 MED ORDER — HEPARIN SOD (PORK) LOCK FLUSH 100 UNIT/ML IV SOLN
500.0000 [IU] | Freq: Once | INTRAVENOUS | Status: AC | PRN
  Filled 2021-01-21: qty 5

## 2021-01-21 MED ORDER — HEPARIN SOD (PORK) LOCK FLUSH 100 UNIT/ML IV SOLN
INTRAVENOUS | Status: AC
  Administered 2021-01-21: 500 [IU]
  Filled 2021-01-21: qty 5

## 2021-01-21 MED ORDER — ACETAMINOPHEN 325 MG PO TABS
650.0000 mg | ORAL_TABLET | Freq: Once | ORAL | Status: AC
  Administered 2021-01-21: 650 mg via ORAL
  Filled 2021-01-21: qty 2

## 2021-01-21 MED ORDER — POTASSIUM CHLORIDE CRYS ER 20 MEQ PO TBCR: 20.0000 meq | EXTENDED_RELEASE_TABLET | Freq: Once | ORAL | 0 refills | Status: DC

## 2021-01-21 MED ORDER — OXYCODONE HCL ER 10 MG PO T12A: 10.0000 mg | EXTENDED_RELEASE_TABLET | Freq: Two times a day (BID) | ORAL | 0 refills | Status: DC

## 2021-01-21 NOTE — Progress Notes (Signed)
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  History obtained with the help of an interpreter.Patient self palpated left breast mass which was followed by a diagnostic bilateral mammogram.  Mammogram showed 3.1 x 2.9 x 1.9 cm hypoechoic mass at the 1 o'clock position of the left breast.  For abnormal cortically thickened left axillary lymph nodes measuring up to 5 mm.  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  Patient was also having ongoing back pain and was seen by [facility] orthopedics Dr. Izola Price who ordered MRI lumbar spine without contrast which showed possible pathologic fractures of L1 and L4 vertebral bodies with greater than 50% height loss at L1 and abnormal signal involving L2-L3 S1 as well as right iliac bone concerning for metastatic disease.  Patient is a single mother of 3 adult children and is very anxious today.  She reports significant back pain which radiates to her bilateral thighs.  Denies any focal tingling numbness or weakness.  Denies any bowel bladder incontinence.  Pain has been uncontrolled despite taking Tylenol.  No prior history of abnormal breast biopsies.  No family history of breast  cancer   PET and MRI showed 3 areas of pathologic fracture of her spine as well as widespread bony metastatic disease and concern for impending fracture of the right hip.  Given her worsening pain she was asked to come to the ER.  She has been evaluated by Dr. Rosita Kea from orthopedic surgery and underwent kyphoplasty at 3 different levels. T6 L1 and L4 along with radiofrequency ablation.    She also underwent prophylactic fixation of the right hip and not affected the sacral region.     Patient received first dose of Herceptin and Perjeta on 09/04/2019.  Baseline echocardiogram normal.she is currently getting taxol/ herceptin/perjeta   Patient admitted to hospital for acute abdominal pain with CT findings concerning for acute colitis.  Colonoscopy showed diffuse severe inflammation with erythema friability and loss of vascularity and shallow ulcerations in the IC valve, ascending colon and cecum.  Interval history- Reports chronic pain in her bilateral lower legs but denies any new complaints at this time.  She has baseline right hip pain which has remained stable  ECOG PS- 1 Pain scale- 3   Review of systems- Review of Systems  Constitutional:  Negative for chills, fever, malaise/fatigue and weight loss.  HENT:  Negative for congestion, ear discharge and nosebleeds.   Eyes:  Negative for blurred vision.  Respiratory:  Negative for cough, hemoptysis, sputum production, shortness of breath and wheezing.   Cardiovascular:  Negative for chest pain, palpitations, orthopnea and claudication.  Gastrointestinal:  Negative for abdominal pain, blood in stool, constipation, diarrhea, heartburn, melena, nausea and vomiting.  Genitourinary:  Negative for  dysuria, flank pain, frequency, hematuria and urgency.  Musculoskeletal:  Negative for back pain, joint pain and myalgias.       Bilateral leg pain  Skin:  Negative for rash.  Neurological:  Negative for dizziness, tingling, focal weakness, seizures,  weakness and headaches.  Endo/Heme/Allergies:  Does not bruise/bleed easily.  Psychiatric/Behavioral:  Negative for depression and suicidal ideas. The patient does not have insomnia.     No Known Allergies   Past Medical History:  Diagnosis Date   Anxiety    Breast cancer ([place])    with mets   Cancer ([place])    Colitis    COVID-19 in immunocompromised patient ([place])    Family history of colon cancer    Vertigo      Past Surgical History:  Procedure Laterality Date   BREAST BIOPSY Left 08/14/2019   [place] bx of mass, coil marker, path pending   BREAST BIOPSY Left 08/14/2019   [place] bx of LN, hydromarker, path pending   BREAST BIOPSY Left 08/14/2019   affirm bx of calcs, x marker, path pending   ESOPHAGOGASTRODUODENOSCOPY (EGD) WITH PROPOFOL N/A 10/05/2019   Procedure: ESOPHAGOGASTRODUODENOSCOPY (EGD) WITH PROPOFOL;  Surgeon: Lin Landsman, MD;  Location: [place];  Service: Gastroenterology;  Laterality: N/A;   FLEXIBLE SIGMOIDOSCOPY N/A 10/05/2019   Procedure: FLEXIBLE SIGMOIDOSCOPY;  Surgeon: Lin Landsman, MD;  Location: [facility] ENDOSCOPY;  Service: Gastroenterology;  Laterality: N/A;   INTRAMEDULLARY (IM) NAIL INTERTROCHANTERIC Right 09/01/2019   Procedure: INTRAMEDULLARY (IM) NAIL INTERTROCHANTRIC AND RADIOFREQUENCY ABLATION;  Surgeon: Hessie Knows, MD;  Location: [facility] ORS;  Service: Orthopedics;  Laterality: Right;   KYPHOPLASTY N/A 08/29/2019   Procedure: KYPHOPLASTY T6, L1,L4 ,  RADIOFREQUENCY ABLATION;  Surgeon: Hessie Knows, MD;  Location: [facility] ORS;  Service: Orthopedics;  Laterality: N/A;   KYPHOPLASTY Right 09/01/2019   Procedure: Right Sacral Radiofrequency Ablation and Cement Augmentation, Right sacrum and iliac crest;  Surgeon: Hessie Knows, MD;  Location: [facility] ORS;  Service: Orthopedics;  Laterality: Right;   PORTA CATH INSERTION N/A 08/28/2019   Procedure: PORTA CATH INSERTION;  Surgeon: Algernon Huxley, MD;  Location: [place] CV LAB;  Service:  Cardiovascular;  Laterality: N/A;    Social History   Socioeconomic History   Marital status: Single    Spouse name: Not on file   Number of children: Not on file   Years of education: Not on file   Highest education level: Not on file  Occupational History   Not on file  Tobacco Use   Smoking status: Never   Smokeless tobacco: Never  Vaping Use   Vaping Use: Never used  Substance and Sexual Activity   Alcohol use: Not Currently   Drug use: Not Currently   Sexual activity: Not Currently    Birth control/protection: None  Other Topics Concern   Not on file  Social History Narrative   Lives at home with children   Social Determinants of Health   Financial Resource Strain: Not on file  Food Insecurity: Not on file  Transportation Needs: Not on file  Physical Activity: Not on file  Stress: Not on file  Social Connections: Not on file  Intimate Partner Violence: Not on file    Family History  Problem Relation Age of Onset   Colon cancer Maternal Uncle      Current Outpatient Medications:    gabapentin (NEURONTIN) 600 MG tablet, Take 1 tablet (600 mg total) by mouth 3 (three) times daily., Disp: 90 tablet, Rfl: 3  lidocaine-prilocaine (EMLA) cream, Apply 1 application topically as needed. Apply small amount to port site at least 1 hour prior to it being accessed, cover with plastic wrap, Disp: 30 g, Rfl: 1   oxycodone (OXY-IR) 5 MG capsule, Take 1-2 capsules (5-10 mg total) by mouth every 4 (four) hours as needed., Disp: 90 capsule, Rfl: 0   oxyCODONE (OXYCONTIN) 10 mg 12 hr tablet, Take 1 tablet (10 mg total) by mouth every 12 (twelve) hours., Disp: 60 tablet, Rfl: 0   potassium chloride SA (KLOR-CON) 20 MEQ tablet, Take 1 tablet (20 mEq total) by mouth once for 1 dose., Disp: 1 tablet, Rfl: 0   albuterol (VENTOLIN HFA) 108 (90 Base) MCG/ACT inhaler, Inhale 2 puffs into the lungs every 6 (six) hours as needed for wheezing or shortness of breath. (Patient not taking: No  sig reported), Disp: 8 g, Rfl: 2   calcium-vitamin D (OSCAL WITH D) 500-200 MG-UNIT tablet, Take 1 tablet by mouth 2 (two) times daily. (Patient not taking: Reported on 01/21/2021), Disp: 30 tablet, Rfl: 0   cyclobenzaprine (FLEXERIL) 5 MG tablet, Take 1 tablet (5 mg total) by mouth 3 (three) times daily as needed for muscle spasms. (Patient not taking: No sig reported), Disp: 30 tablet, Rfl: 0   diphenoxylate-atropine (LOMOTIL) 2.5-0.025 MG tablet, Take 1 tablet by mouth 4 (four) times daily as needed for diarrhea or loose stools. (Patient not taking: No sig reported), Disp: 40 tablet, Rfl: 0   DULoxetine (CYMBALTA) 30 MG capsule, Take 1 capsule (30 mg total) by mouth daily. (Patient not taking: Reported on 12/31/2020), Disp: 30 capsule, Rfl: 3   ergocalciferol (VITAMIN D2) 1.25 MG (50000 UT) capsule, Take 1 capsule (50,000 Units total) by mouth once a week. For 4 weeks. Then 1 capsule once monthly for 4 months (Patient not taking: Reported on 01/21/2021), Disp: 8 capsule, Rfl: 0   ondansetron (ZOFRAN) 8 MG tablet, Take 1 tablet (8 mg total) by mouth every 8 (eight) hours as needed for nausea or vomiting. (Patient not taking: No sig reported), Disp: 45 tablet, Rfl: 2   potassium chloride SA (KLOR-CON) 20 MEQ tablet, Take 1 tablet (20 mEq total) by mouth daily for 7 days. (Patient not taking: No sig reported), Disp: 7 tablet, Rfl: 0   prochlorperazine (COMPAZINE) 10 MG tablet, Take 1 tablet (10 mg total) by mouth every 6 (six) hours as needed for nausea or vomiting. (Patient not taking: No sig reported), Disp: 45 tablet, Rfl: 2 No current facility-administered medications for this visit.  Facility-Administered Medications Ordered in Other Visits:    0.9 %  sodium chloride infusion, , Intravenous, Continuous, Burns, Wandra Feinstein, NP, Stopped at 06/02/20 1453   heparin lock flush 100 UNIT/ML injection, , , ,    heparin lock flush 100 unit/mL, 500 Units, Intravenous, Once, Sindy Guadeloupe, MD   heparin lock  flush 100 unit/mL, 500 Units, Intravenous, Once, Sindy Guadeloupe, MD   heparin lock flush 100 unit/mL, 500 Units, Intracatheter, Once PRN, Sindy Guadeloupe, MD   sodium chloride flush (NS) 0.9 % injection 10 mL, 10 mL, Intravenous, Once, Sindy Guadeloupe, MD   sodium chloride flush (NS) 0.9 % injection 10 mL, 10 mL, Intravenous, Once, Sindy Guadeloupe, MD   sodium chloride flush (NS) 0.9 % injection 10 mL, 10 mL, Intracatheter, PRN, Sindy Guadeloupe, MD  Physical exam:  Vitals:   01/21/21 0952  BP: 120/83  Pulse: 79  Resp: 16  Temp: 98.5 F (36.9 C)  SpO2:  99%  Weight: 137 lb 12.8 oz (62.5 kg)   Physical Exam Cardiovascular:     Rate and Rhythm: Normal rate and regular rhythm.     Heart sounds: Normal heart sounds.  Pulmonary:     Effort: Pulmonary effort is normal.  Skin:    General: Skin is warm and dry.  Neurological:     Mental Status: She is alert and oriented to person, place, and time.     CMP Latest Ref Rng & Units 01/21/2021  Glucose 70 - 99 mg/dL 172(H)  BUN 6 - 20 mg/dL 11  Creatinine 0.44 - 1.00 mg/dL 0.57  Sodium 135 - 145 mmol/L 135  Potassium 3.5 - 5.1 mmol/L 3.1(L)  Chloride 98 - 111 mmol/L 104  CO2 22 - 32 mmol/L 23  Calcium 8.9 - 10.3 mg/dL 8.6(L)  Total Protein 6.5 - 8.1 g/dL 7.5  Total Bilirubin 0.3 - 1.2 mg/dL 0.4  Alkaline Phos 38 - 126 U/L 72  AST 15 - 41 U/L 31  ALT 0 - 44 U/L 20   CBC Latest Ref Rng & Units 01/21/2021  WBC 4.0 - 10.5 K/uL 5.3  Hemoglobin 12.0 - 15.0 g/dL 13.3  Hematocrit 36.0 - 46.0 % 40.1  Platelets 150 - 400 K/uL 311    No images are attached to the encounter.  NM Bone Scan Whole Body  Result Date: 01/20/2021 CLINICAL DATA:  Breast carcinoma EXAM: NUCLEAR MEDICINE WHOLE BODY BONE SCAN TECHNIQUE: Whole body anterior and posterior images were obtained approximately 3 hours after intravenous injection of radiopharmaceutical. RADIOPHARMACEUTICALS:  20.4 mCi Technetium-40m MDP IV COMPARISON:  Previous studies including the examination  of 04/01/2020 FINDINGS: There is diffuse increased tracer uptake in the intertrochanteric portion of right femur and proximal shaft, possibly related to to previous internal fixation. Amount of activity along the medial cortex of proximal shaft of right femur and in the distal shaft of right femur since the previous examination. Focal uptake seen in left iliac bone has not changed. There are subtle foci of uptake in upper lumbar spine with no significant change. There is subtle asymmetry in the left occipital condyle as not changed. IMPRESSION: There is interval increase in amount of uptake in the medial cortex of proximal shaft of right femur. There is subtle increased uptake in the distal shaft of right femur. These may be related to possible loosening of surgical hardware or metastatic disease. No other new focal abnormal foci of uptake are noted. Electronically Signed   By: Elmer Picker M.D.   On: 01/20/2021 15:16   CT CHEST ABDOMEN PELVIS W CONTRAST  Result Date: 01/19/2021 CLINICAL DATA:  Metastatic breast cancer EXAM: CT CHEST, ABDOMEN, AND PELVIS WITH CONTRAST TECHNIQUE: Multidetector CT imaging of the chest, abdomen and pelvis was performed following the standard protocol during bolus administration of intravenous contrast. CONTRAST:  133mL OMNIPAQUE IOHEXOL 300 MG/ML  SOLN COMPARISON:  10/13/2020 FINDINGS: CT CHEST FINDINGS Cardiovascular: The heart is normal in size. No pericardial effusion. No evidence thoracic aortic aneurysm. Right chest port terminates at the cavoatrial junction. Mediastinum/Nodes: No suspicious mediastinal or axillary lymphadenopathy. Status post left breast lumpectomy. Visualized thyroid is unremarkable. Lungs/Pleura: 5 mm nodule the left lung base (series 3/image 89), unchanged. No new/suspicious pulmonary nodules. No focal consolidation. No pleural effusion or pneumothorax. Musculoskeletal: Prior vertebral augmentation at T6. Stable scattered sclerotic osseous  metastases in the visualized axial and appendicular skeleton, grossly unchanged. Correlate pending bone scan. CT ABDOMEN PELVIS FINDINGS Hepatobiliary: Liver is within normal limits. No suspicious/enhancing  hepatic lesions. Gallbladder is unremarkable. No intrahepatic or extrahepatic ductal dilatation. Pancreas: Within normal limits. Spleen: Within normal limits. Adrenals/Urinary Tract: Adrenal glands are within normal limits. Kidneys are within normal limits.  No hydronephrosis. Bladder is within normal limits. Stomach/Bowel: Stomach within normal limits. No evidence of bowel obstruction. Normal appendix (series 2/image 86). No colonic wall thickening or inflammatory changes. Vascular/Lymphatic: No evidence of abdominal aortic aneurysm. No suspicious abdominopelvic lymphadenopathy. Reproductive: Uterus is within normal limits. Bilateral ovaries are within normal limits. Other: No abdominopelvic ascites. Musculoskeletal: Prior vertebral augmentation at L1 and L4. Prior right sacroplasty. Scattered sclerotic metastases in the visualized lumbar spine, grossly unchanged. Correlate with pending bone scan. Status post ORIF of the right proximal femur. IMPRESSION: Status post left breast lumpectomy. Stable sclerotic osseous metastases in the visualized axial and appendicular skeleton, grossly unchanged. Correlate with pending bone scan. Otherwise, no evidence of recurrent or metastatic disease. Electronically Signed   By: Julian Hy M.D.   On: 01/19/2021 23:49   ECHOCARDIOGRAM COMPLETE  Result Date: 01/05/2021    ECHOCARDIOGRAM REPORT   Patient Name:   MINDEE ROBLEDO [facility] Date of Exam: 01/05/2021 Medical Rec #:  [id number]             Height:       60.0 in Accession #:    [id number]            Weight:       134.2 lb Date of Birth:  April 23, 1980             BSA:          1.575 m Patient Age:    56 years              BP:           125/83 mmHg Patient Gender: F                     HR:           62 bpm. Exam  Location:  [facility] Procedure: 2D Echo, Cardiac Doppler, Color Doppler and Strain Analysis Indications:     C50.919 Metastatic Breast cancer.                  History of breast cancer--assess treatment response  History:         Patient has prior history of Echocardiogram examinations, most                  recent 10/04/2020. Breast cancer, Covid-19.  Sonographer:     Sherrie Sport Referring Phys:  [id number] [facility] C Chinwe Lope Diagnosing Phys: Kate Sable MD  Sonographer Comments: Suboptimal apical window and suboptimal subcostal window. IMPRESSIONS  1. Left ventricular ejection fraction, by estimation, is 55 to 60%. The left ventricle has normal function. The left ventricle has no regional wall motion abnormalities. Left ventricular diastolic parameters were normal.  2. Right ventricular systolic function is normal. The right ventricular size is normal.  3. The mitral valve is normal in structure. No evidence of mitral valve regurgitation.  4. The aortic valve is grossly normal. Aortic valve regurgitation is not visualized. FINDINGS  Left Ventricle: Left ventricular ejection fraction, by estimation, is 55 to 60%. The left ventricle has normal function. The left ventricle has no regional wall motion abnormalities. Global longitudinal strain performed but not reported based on interpreter judgement due to suboptimal tracking. 3D left ventricular ejection fraction analysis performed but not reported based on interpreter judgement due to suboptimal  quality. The left ventricular internal cavity size was normal in size. There is no left ventricular hypertrophy. Left ventricular diastolic parameters were normal. Right Ventricle: The right ventricular size is normal. No increase in right ventricular wall thickness. Right ventricular systolic function is normal. Left Atrium: Left atrial size was normal in size. Right Atrium: Right atrial size was normal in size. Pericardium: There is no evidence of pericardial effusion. Mitral  Valve: The mitral valve is normal in structure. No evidence of mitral valve regurgitation. Tricuspid Valve: The tricuspid valve is normal in structure. Tricuspid valve regurgitation is not demonstrated. Aortic Valve: The aortic valve is grossly normal. Aortic valve regurgitation is not visualized. Aortic valve mean gradient measures 3.0 mmHg. Aortic valve peak gradient measures 5.9 mmHg. Aortic valve area, by VTI measures 2.22 cm. Pulmonic Valve: The pulmonic valve was not well visualized. Pulmonic valve regurgitation is not visualized. Aorta: The aortic root is normal in size and structure. Venous: The inferior vena cava was not well visualized. IAS/Shunts: No atrial level shunt detected by color flow Doppler.  LEFT VENTRICLE PLAX 2D LVIDd:         3.55 cm   Diastology LVIDs:         2.33 cm   LV e' medial:    6.20 cm/s LV PW:         1.11 cm   LV E/e' medial:  11.2 LV IVS:        0.95 cm   LV e' lateral:   11.20 cm/s LVOT diam:     2.00 cm   LV E/e' lateral: 6.2 LV SV:         45 LV SV Index:   28 LVOT Area:     3.14 cm  RIGHT VENTRICLE RV S prime:     10.20 cm/s TAPSE (M-mode): 3.9 cm LEFT ATRIUM             Index        RIGHT ATRIUM           Index LA diam:        2.50 cm 1.59 cm/m   RA Area:     11.00 cm LA Vol (A2C):   52.1 ml 33.07 ml/m  RA Volume:   25.50 ml  16.19 ml/m LA Vol (A4C):   28.8 ml 18.28 ml/m LA Biplane Vol: 41.7 ml 26.47 ml/m  AORTIC VALVE                    PULMONIC VALVE AV Area (Vmax):    2.08 cm     PV Vmax:        0.51 m/s AV Area (Vmean):   1.94 cm     PV Peak grad:   1.0 mmHg AV Area (VTI):     2.22 cm     RVOT Peak grad: 2 mmHg AV Vmax:           121.00 cm/s AV Vmean:          76.400 cm/s AV VTI:            0.201 m AV Peak Grad:      5.9 mmHg AV Mean Grad:      3.0 mmHg LVOT Vmax:         80.30 cm/s LVOT Vmean:        47.300 cm/s LVOT VTI:          0.142 m LVOT/AV VTI ratio: 0.71  AORTA Ao Root diam: 2.70 cm  MITRAL VALVE               TRICUSPID VALVE MV Area (PHT): 5.34 cm     TR Peak grad:   18.7 mmHg MV Decel Time: 142 msec    TR Vmax:        216.00 cm/s MV E velocity: 69.40 cm/s MV A velocity: 62.60 cm/s  SHUNTS MV E/A ratio:  1.11        Systemic VTI:  0.14 m                            Systemic Diam: 2.00 cm Kate Sable MD Electronically signed by Kate Sable MD Signature Date/Time: 01/05/2021/4:44:58 PM    Final      Assessment and plan- Patient is a 40 y.o. female with stage IV ER/PR negative HER2 positive left breast cancer with bone metastases here for on treatment assessment prior to next cycle of palliative Herceptin and Perjeta  I have reviewed CT chest abdomen pelvis images as well as bone scan images independently and discussed findings with the patient.  CT scan shows no evidence ofRecently metastatic disease and bone mets appears stable on CT.  On bone scan there is increased uptake in the medial cortex of the proximal shaft of the right femur as well as subtle uptake in the distal shaft of the right femur.  This was not seen on bone scan prior in April 2022.  I will proceed with a plain x-ray of the right femur at this time.  I will reach out to Dr. Donella Stade from radiation oncology to see if there would be no role for palliative radiation to the distal aspect of the right femur.  I am not inclined to change her present treatment at this time given that there is no other evidence of distant metastatic disease.  Also based on x-ray I will see if there would be any role for surgical intervention and I will reach out to Dr. Rudene Christians for the same.  I will see her back in 3 weeks for cycle 25 of Herceptin and Perjeta.  Recent echocardiogram showed normal EF  Neoplasm related pain: Continue as needed oxycodone.    Chemo induced peripheral neuropathy:Continue gabapentin.  I suspect her bilateral leg pain is more from neuropathy since there is no evidence of metastatic disease seen in her bilateral legs.   Visit Diagnosis 1. Pain in joint involving ankle and  foot, unspecified laterality   2. Encounter for monoclonal antibody treatment for malignancy   3. Primary cancer of left breast with metastasis to other site [facility])      Dr. Randa Evens, MD, MPH [facility] [phone] 01/21/2021 12:52 PM

## 2021-01-21 NOTE — Progress Notes (Signed)
K 3.1 - per Dr. Janese Banks will send oral potassium rx, no IV supp today

## 2021-01-21 NOTE — Progress Notes (Signed)
Pt states that since yesterday the bottom of her legs have been in pain 7/10 as well as, yesterday night her rt foot was very cold and lasted for about 3 hours.

## 2021-01-21 NOTE — Patient Instructions (Signed)
[facility]  Discharge Instructions: Thank you for choosing [facility] to provide your oncology and hematology care.  If you have a lab appointment with the [facility], please go directly to the [facility] and check in at the registration area.  Wear comfortable clothing and clothing appropriate for easy access to any Portacath or PICC line.   We strive to give you quality time with your provider. You may need to reschedule your appointment if you arrive late (15 or more minutes).  Arriving late affects you and other patients whose appointments are after yours.  Also, if you miss three or more appointments without notifying the office, you may be dismissed from the clinic at the provider's discretion.      For prescription refill requests, have your pharmacy contact our office and allow 72 hours for refills to be completed.    Today you received the following chemotherapy and/or immunotherapy agents - Herceptin, Perjeta      To help prevent nausea and vomiting after your treatment, we encourage you to take your nausea medication as directed.  BELOW ARE SYMPTOMS THAT SHOULD BE REPORTED IMMEDIATELY: *FEVER GREATER THAN 100.4 F (38 C) OR HIGHER *CHILLS OR SWEATING *NAUSEA AND VOMITING THAT IS NOT CONTROLLED WITH YOUR NAUSEA MEDICATION *UNUSUAL SHORTNESS OF BREATH *UNUSUAL BRUISING OR BLEEDING *URINARY PROBLEMS (pain or burning when urinating, or frequent urination) *BOWEL PROBLEMS (unusual diarrhea, constipation, pain near the anus) TENDERNESS IN MOUTH AND THROAT WITH OR WITHOUT PRESENCE OF ULCERS (sore throat, sores in mouth, or a toothache) UNUSUAL RASH, SWELLING OR PAIN  UNUSUAL VAGINAL DISCHARGE OR ITCHING   Items with * indicate a potential emergency and should be followed up as soon as possible or go to the Emergency Department if any problems should occur.  Please show the CHEMOTHERAPY ALERT CARD or IMMUNOTHERAPY ALERT CARD at  check-in to the Emergency Department and triage nurse.  Should you have questions after your visit or need to cancel or reschedule your appointment, please contact [facility]  [phone] and follow the prompts.  Office hours are 8:00 a.m. to 4:30 p.m. Monday - Friday. Please note that voicemails left after 4:00 p.m. may not be returned until the following business day.  We are closed weekends and major holidays. You have access to a nurse at all times for urgent questions. Please call the main number to the clinic [phone] and follow the prompts.  For any non-urgent questions, you may also contact your provider using MyChart. We now offer e-Visits for anyone 57 and older to request care online for non-urgent symptoms. For details visit [email or web address].   Also download the MyChart app! Go to the app store, search "MyChart", open the app, select [facility], and log in with your MyChart username and password.  Due to Covid, a mask is required upon entering the hospital/clinic. If you do not have a mask, one will be given to you upon arrival. For doctor visits, patients may have 1 support person aged 40 or older with them. For treatment visits, patients cannot have anyone with them due to current Covid guidelines and our immunocompromised population.   Trastuzumab injection for infusion Qu es este medicamento? El TRASTUZUMAB es un anticuerpo monoclonal. Genene Churn para tratar el cncer de mama y de [place]. Este medicamento puede ser utilizado para otros usos; si tiene alguna pregunta consulte con su proveedor de atencin mdica o con su farmacutico. MARCAS COMUNES: Herceptin, Belenda Cruise,  Brooke Bonito, Trazimera Qu le debo informar a mi profesional de la salud antes de tomar este medicamento? Necesitan saber si usted presenta alguno de los siguientes problemas o situaciones: enfermedad cardiaca insuficiencia cardiaca enfermedad  pulmonar o respiratoria, como asma una reaccin alrgica o inusual al trastuzumab, al alcohol benclico, a otros medicamentos, alimentos, colorantes o conservantes si est embarazada o buscando quedar embarazada si est amamantando a un beb Cmo debo utilizar este medicamento? Este medicamento se administra mediante infusin por va intravenosa. Lo administra un profesional de la salud calificado en un hospital o en un entorno clnico. Hable con su pediatra para informarse acerca del uso de este medicamento en nios. Este medicamento no est aprobado para uso en nios. Sobredosis: Pngase en contacto inmediatamente con un centro toxicolgico o una sala de urgencia si usted cree que haya tomado demasiado medicamento. ATENCIN: [employer] es solo para usted. No comparta este medicamento con nadie. Qu sucede si me olvido de una dosis? Es importante no olvidar ninguna dosis. Informe a su mdico o a su profesional de la salud si no puede asistir a Photographer. Qu puede interactuar con este medicamento? Este medicamento podra interactuar con los siguientes frmacos: ciertos tipos de quimioterapia, tales como daunorubicina, [place], [place], e idarubicina Puede ser que esta lista no menciona todas las posibles interacciones. Informe a su profesional de [employer]	[place] de [employer] productos a base de hierbas, medicamentos de [place] o suplementos nutritivos que est tomando. Si usted fuma, consume bebidas alcohlicas o si utiliza drogas ilegales, indqueselo tambin a su profesional de [employer]	[place]. Algunas sustancias pueden interactuar con su medicamento. A qu debo estar atento al usar Coca[employer]Col[employer]? Visite a su mdico para chequear su evolucin peridicamente. Si presenta algn efecto secundario, infrmelo. Sin embargo, contine con el tratamiento aun si se siente enfermo, a menos que su mdico le indique que lo suspenda. Consulte a su mdico o a su profesional de la salud si tiene  fiebre, escalofros, dolor de garganta, o cualquier otro sntoma de resfro o gripe. No se trate usted mismo. Trate de no acercarse a personas que estn enfermas. Es posible que tenga [place], escalofros y temblores durante su primera infusin. Estos efectos generalmente son leves y pueden tratarse con otros medicamentos. Informe cualquier efecto secundario durante la infusin a su profesional de [employer]	[place]. La fiebre y los escalofros por lo general no suceden con las infusiones posteriores. No debe quedar embarazada mientras est tomando este medicamento o por 7 meses despus de dejar de usarlo. Las mujeres deben informar a su mdico si estn buscando quedar embarazadas o si creen que estn embarazadas. Las mujeres con la posibilidad de Best boy nios deben tener una prueba de embarazo negativa antes de Art gallery manager a tomar este medicamento. Existe la posibilidad de que ocurran efectos secundarios graves a un beb sin nacer. Para ms informacin hable con su profesional de la salud o su farmacutico. No debe amamantar a un beb mientras est tomando este medicamento o por 7 meses despus de dejar de usarlo. Las [employer] deben usar un mtodo anticonceptivo eficaz con este medicamento. Qu efectos secundarios puedo tener al [employer] este medicamento? Efectos secundarios que debe informar a su mdico o a Barrister's clerk de la salud tan pronto como sea posible: Chief of Staff, como erupcin cutnea, comezn/picazn o urticaria, e hinchazn de la cara, los labios o la lengua dolor en el pecho o palpitaciones tos mareos sensacin de Youth worker o aturdimiento, cadas fiebre sensacin general de estar enfermo o sntomas gripales  signos de empeoramiento de la insuficiencia cardiaca, tales como problemas respiratorios; hinchazn en las piernas y los pies cansancio o debilidad inusual Efectos secundarios que generalmente no requieren atencin mdica (infrmelos a su mdico o a Barrister's clerk de la salud si persisten  o si son molestos): dolor de Psychiatric nurse sentido del gusto diarrea dolor en las articulaciones nuseas, vmito prdida de peso Puede ser que esta lista no menciona todos los posibles efectos secundarios. Comunquese a su mdico por asesoramiento mdico [employer] Usted puede informar los efectos secundarios a la FDA por telfono al 1-800-FDA-1088. Dnde debo guardar mi medicina? Este medicamento se administra en hospitales o clnicas y no necesitar guardarlo en su domicilio. ATENCIN: Este folleto es un resumen. Puede ser que no cubra toda la posible informacin. Si usted tiene preguntas acerca de esta medicina, consulte con su mdico, su farmacutico o su profesional de Technical sales engineer.  2022 Elsevier/Gold Standard (2019-09-09 00:00:00)   Pertuzumab injection Qu es este medicamento? El PERTUZUMAB es un anticuerpo monoclonal. Genene Churn para tratar el cncer de mama. Este medicamento puede ser utilizado para otros usos; si tiene alguna pregunta consulte con su proveedor de atencin mdica o con su farmacutico. MARCAS COMUNES: PERJETA Qu le debo informar a mi profesional de la salud antes de tomar este medicamento? Necesita saber si usted presenta alguno de los siguientes problemas o situaciones: enfermedad cardiaca insuficiencia cardiaca alta presin sangunea antecedentes de pulso cardiaca irregular radioterapia reciente o continuada una reaccin alrgica o inusual al pertuzumab, a otros medicamentos, alimentos, colorantes o conservantes si est embarazada o buscando quedar embarazada si est amamantando a un beb Cmo debo utilizar este medicamento? Este medicamento se administra mediante infusin por va intravenosa. Lo administra un profesional de Technical sales engineer en un hospital o en un entorno clnico. Hable con su pediatra para informarse acerca del uso de este medicamento en nios. Puede requerir atencin especial. Sobredosis: Pngase en contacto  inmediatamente con un centro toxicolgico o una sala de urgencia si usted cree que haya tomado demasiado medicamento. ATENCIN: [employer] es solo para usted. No comparta este medicamento con nadie. Qu sucede si me olvido de una dosis? Es importante no olvidar ninguna dosis. Informe a su mdico o a su profesional de la salud si no puede asistir a Photographer. Qu puede interactuar con este medicamento? No se esperan interacciones. Puede ser que esta lista no menciona todas las posibles interacciones. Informe a su profesional de [employer]	[place] de [employer] productos a base de hierbas, medicamentos de [place] o suplementos nutritivos que est tomando. Si usted fuma, consume bebidas alcohlicas o si utiliza drogas ilegales, indqueselo tambin a su profesional de [employer]	[place]. Algunas sustancias pueden interactuar con su medicamento. A qu debo estar atento al usar Coca[employer]Cola? Se supervisar su estado de salud atentamente mientras reciba este medicamento. Si presenta algn efecto secundario, infrmelo. Sin embargo, contine con el tratamiento aun si se siente enfermo, a menos que su mdico le indique que lo suspenda. No debe quedar embarazada mientras est tomando este medicamento o por 7 meses despus de dejar de usarlo. Las mujeres deben informar a su mdico si estn buscando quedar embarazadas o si creen que estn embarazadas. Las mujeres con la posibilidad de Best boy nios deben tener una prueba de embarazo negativa antes de Art gallery manager a tomar este medicamento. Existe la posibilidad de que ocurran efectos secundarios graves a un beb sin nacer. Para ms informacin hable con su profesional de la salud o su  farmacutico. No debe amamantar a un beb mientras est tomando este medicamento o por 7 meses despus de dejar de usarlo. Las [employer] deben usar un mtodo anticonceptivo eficaz con este medicamento. Consulte a su mdico o a su profesional de la salud si tiene fiebre, escalofros, dolor de garganta,  o cualquier otro sntoma de resfro o gripe. No se trate usted mismo. Trate de no acercarse a personas que estn enfermas. Es posible que tenga [place], escalofros y dolor de cabeza durante la infusin. Informe cualquier efecto secundario durante la infusin a su profesional de [employer]	[place]. Qu efectos secundarios puedo tener al [employer] este medicamento? Efectos secundarios que debe informar a su mdico o a Barrister's clerk de la salud tan pronto como sea posible: Arboriculturist o palpitaciones mareos sensacin de Youth worker o aturdimiento fiebre o escalofros erupcin cutnea, picazn o urticaria dolor de garganta hinchazn de la cara, labios o lengua hinchazn de piernas o tobillos cansancio o debilidad inusual Efectos secundarios que generalmente no requieren atencin mdica (infrmelos a su mdico o a su profesional de la salud si persisten o si son molestos): diarrea cada del cabello nuseas, vmito cansancio Puede ser que esta lista no menciona todos los posibles efectos secundarios. Comunquese a su mdico por asesoramiento mdico [employer]. Usted puede informar los efectos secundarios a la FDA por telfono al 1-800-FDA-1088. Dnde debo guardar mi medicina? Este medicamento se administra en hospitales o clnicas y no necesitar guardarlo en su domicilio. ATENCIN: Este folleto es un resumen. Puede ser que no cubra toda la posible informacin. Si usted tiene preguntas acerca de esta medicina, consulte con su mdico, su farmacutico o su profesional de Technical sales engineer.  2022 Elsevier/Gold Standard (2016-04-06 00:00:00)

## 2021-01-28 ENCOUNTER — Other Ambulatory Visit: Payer: Self-pay

## 2021-01-28 ENCOUNTER — Encounter: Payer: Self-pay | Admitting: Radiation Oncology

## 2021-01-28 ENCOUNTER — Ambulatory Visit
Admission: RE | Admit: 2021-01-28 | Discharge: 2021-01-28 | Disposition: A | Discharge status: Home / Self Care | Payer: Self-pay | Source: Ambulatory Visit | Attending: Radiation Oncology | Admitting: Radiation Oncology

## 2021-01-28 VITALS — BP 126/91 | HR 100 | Temp 97.6°F | Resp 16 | Wt 136.4 lb

## 2021-01-28 DIAGNOSIS — Z170 Estrogen receptor positive status [ER+]: Secondary | ICD-10-CM | POA: Insufficient documentation

## 2021-01-28 DIAGNOSIS — C7951 Secondary malignant neoplasm of bone: Secondary | ICD-10-CM | POA: Insufficient documentation

## 2021-01-28 DIAGNOSIS — Z923 Personal history of irradiation: Secondary | ICD-10-CM | POA: Insufficient documentation

## 2021-01-28 DIAGNOSIS — C50911 Malignant neoplasm of unspecified site of right female breast: Secondary | ICD-10-CM | POA: Insufficient documentation

## 2021-01-28 NOTE — Progress Notes (Signed)
Radiation Oncology Follow up Note old patient new area distal right femur metastasis  Name: Sophia [name]   Date:   01/28/2021 MRN:  [id number] DOB: 1980-07-05    This 40 y.o. female presents to the clinic today for evaluation of positive bone scan of the distal right femur and patient previously treated to her right hip for stage IV breast cancer.  REFERRING PROVIDER: [facility]*  HPI: Patient is a Spanish-speaking 40 year old female well-known to apartment having completed radiation therapy to her right hip status postsurgical pinning for stage IV metastatic breast cancer..  She is undergoing kyphoplasty at T6 L1 and L4 along with radiofrequency ablation for metastatic disease.  She also again underwent prophylactic fixation of the right hip.  She is currently on Perjeta and Herceptin.  She recently had plain films of the right femur showing screws across the femoral neck no complicating features or evidence of lytic destruction.  She does have a bone scan recently showing interval increase in amount of uptake in the medial cortex of the proximal shaft of the right femur.  There is also subtle increase in uptake in the distal right femur.  Been asked to evaluate her for possible palliative radiation therapy to the distal femur.  COMPLICATIONS OF TREATMENT: none  FOLLOW UP COMPLIANCE: keeps appointments   PHYSICAL EXAM:  BP (!) 126/91   Pulse 100   Temp 97.6 F (36.4 C) (Tympanic)   Resp 16   Wt 136 lb 6.4 oz (61.9 kg)   BMI 26.64 kg/m  Range of motion of the lower extremities does not elicit pain.  Well-developed well-nourished patient in NAD. HEENT reveals PERLA, EOMI, discs not visualized.  Oral cavity is clear. No oral mucosal lesions are identified. Neck is clear without evidence of cervical or supraclavicular adenopathy. Lungs are clear to A&P. Cardiac examination is essentially unremarkable with regular rate and rhythm without murmur rub or thrill. Abdomen is  benign with no organomegaly or masses noted. Motor sensory and DTR levels are equal and symmetric in the upper and lower extremities. Cranial nerves II through XII are grossly intact. Proprioception is intact. No peripheral adenopathy or edema is identified. No motor or sensory levels are noted. Crude visual fields are within normal range.  RADIOLOGY RESULTS: Plain films bone scan and CT scans all reviewed compatible with above-stated findings  PLAN: At this time we will go ahead with single fraction palliative radiation therapy to her distal right femur.  We will plan on delivering 8 8 Gray in 1 fraction.  Risks and benefits of treatment occluding possible skin reaction no other side effects were discussed with the patient.  Interpreter was present although patient is having difficulty comprehending her state of her disease that she in fact does have stage IV disease and probably has disease elsewhere in her body.  Again I went over this member many times with the patient although she is quite anxious about her disease and prognosis.  I would like to take this opportunity to thank you for allowing me to participate in the care of your patient.Noreene Filbert, MD

## 2021-01-31 ENCOUNTER — Ambulatory Visit
Admission: RE | Admit: 2021-01-31 | Discharge: 2021-01-31 | Disposition: A | Discharge status: Home / Self Care | Payer: Self-pay | Source: Ambulatory Visit | Attending: Radiation Oncology | Admitting: Radiation Oncology

## 2021-01-31 DIAGNOSIS — C7931 Secondary malignant neoplasm of brain: Secondary | ICD-10-CM | POA: Insufficient documentation

## 2021-01-31 DIAGNOSIS — Z510 Encounter for antineoplastic radiation therapy: Secondary | ICD-10-CM | POA: Insufficient documentation

## 2021-02-03 ENCOUNTER — Ambulatory Visit: Payer: Self-pay

## 2021-02-03 ENCOUNTER — Inpatient Hospital Stay: Payer: Self-pay

## 2021-02-05 ENCOUNTER — Emergency Department: Payer: Self-pay

## 2021-02-05 ENCOUNTER — Emergency Department
Admission: EM | Admit: 2021-02-05 | Discharge: 2021-02-05 | Disposition: A | Discharge status: Home / Self Care | Payer: Self-pay | Attending: Emergency Medicine | Admitting: Emergency Medicine

## 2021-02-05 ENCOUNTER — Other Ambulatory Visit: Payer: Self-pay

## 2021-02-05 DIAGNOSIS — C50919 Malignant neoplasm of unspecified site of unspecified female breast: Secondary | ICD-10-CM | POA: Insufficient documentation

## 2021-02-05 DIAGNOSIS — G936 Cerebral edema: Secondary | ICD-10-CM | POA: Insufficient documentation

## 2021-02-05 DIAGNOSIS — R300 Dysuria: Secondary | ICD-10-CM | POA: Insufficient documentation

## 2021-02-05 DIAGNOSIS — Z8616 Personal history of COVID-19: Secondary | ICD-10-CM | POA: Insufficient documentation

## 2021-02-05 DIAGNOSIS — J111 Influenza due to unidentified influenza virus with other respiratory manifestations: Secondary | ICD-10-CM

## 2021-02-05 DIAGNOSIS — Z7951 Long term (current) use of inhaled steroids: Secondary | ICD-10-CM | POA: Insufficient documentation

## 2021-02-05 DIAGNOSIS — J101 Influenza due to other identified influenza virus with other respiratory manifestations: Secondary | ICD-10-CM | POA: Insufficient documentation

## 2021-02-05 DIAGNOSIS — R519 Headache, unspecified: Secondary | ICD-10-CM | POA: Insufficient documentation

## 2021-02-05 DIAGNOSIS — Z20822 Contact with and (suspected) exposure to covid-19: Secondary | ICD-10-CM | POA: Insufficient documentation

## 2021-02-05 LAB — COMPREHENSIVE METABOLIC PANEL
ALT: 35 U/L (ref 0–44)
AST: 33 U/L (ref 15–41)
Albumin: 3.2 g/dL — ABNORMAL LOW (ref 3.5–5.0)
Alkaline Phosphatase: 77 U/L (ref 38–126)
Anion gap: 6 (ref 5–15)
BUN: 8 mg/dL (ref 6–20)
CO2: 21 mmol/L — ABNORMAL LOW (ref 22–32)
Calcium: 7.4 mg/dL — ABNORMAL LOW (ref 8.9–10.3)
Chloride: 108 mmol/L (ref 98–111)
Creatinine, Ser: 0.34 mg/dL — ABNORMAL LOW (ref 0.44–1.00)
GFR, Estimated: >60 mL/min (ref >60)
Glucose, Bld: 84 mg/dL (ref 70–99)
Potassium: 3.3 mmol/L — ABNORMAL LOW (ref 3.5–5.1)
Sodium: 135 mmol/L (ref 135–145)
Total Bilirubin: 0.4 mg/dL (ref 0.3–1.2)
Total Protein: 6.8 g/dL (ref 6.5–8.1)

## 2021-02-05 LAB — CBC WITH DIFFERENTIAL/PLATELET
Abs Immature Granulocytes: 0.01 10*3/uL (ref 0.00–0.07)
Basophils Absolute: 0 10*3/uL (ref 0.0–0.1)
Basophils Relative: 0 %
Eosinophils Absolute: 0 10*3/uL (ref 0.0–0.5)
Eosinophils Relative: 1 %
HCT: 44 % (ref 36.0–46.0)
Hemoglobin: 14.3 g/dL (ref 12.0–15.0)
Immature Granulocytes: 0 %
Lymphocytes Relative: 40 %
Lymphs Abs: 1.6 10*3/uL (ref 0.7–4.0)
MCH: 28.3 pg (ref 26.0–34.0)
MCHC: 32.5 g/dL (ref 30.0–36.0)
MCV: 87 fL (ref 80.0–100.0)
Monocytes Absolute: 0.4 10*3/uL (ref 0.1–1.0)
Monocytes Relative: 10 %
Neutro Abs: 2 10*3/uL (ref 1.7–7.7)
Neutrophils Relative %: 49 %
Platelets: 266 10*3/uL (ref 150–400)
RBC: 5.06 MIL/uL (ref 3.87–5.11)
RDW: 14.4 % (ref 11.5–15.5)
WBC: 4.1 10*3/uL (ref 4.0–10.5)
nRBC: 0 % (ref 0.0–0.2)

## 2021-02-05 LAB — RESP PANEL BY RT-PCR (FLU A&B, COVID) ARPGX2
Influenza A by PCR: POSITIVE — AB
Influenza B by PCR: NEGATIVE
SARS Coronavirus 2 by RT PCR: NEGATIVE

## 2021-02-05 LAB — LACTIC ACID, PLASMA: Lactic Acid, Venous: 0.6 mmol/L (ref 0.5–1.9)

## 2021-02-05 IMAGING — CR DG CHEST 2V
2 series · 2 of 2 positions shown · non-contrast
Comparison: Chest x-ray [DATE]

CLINICAL DATA: Infection, fever

EXAM:
CHEST - 2 VIEW

[chest pa]
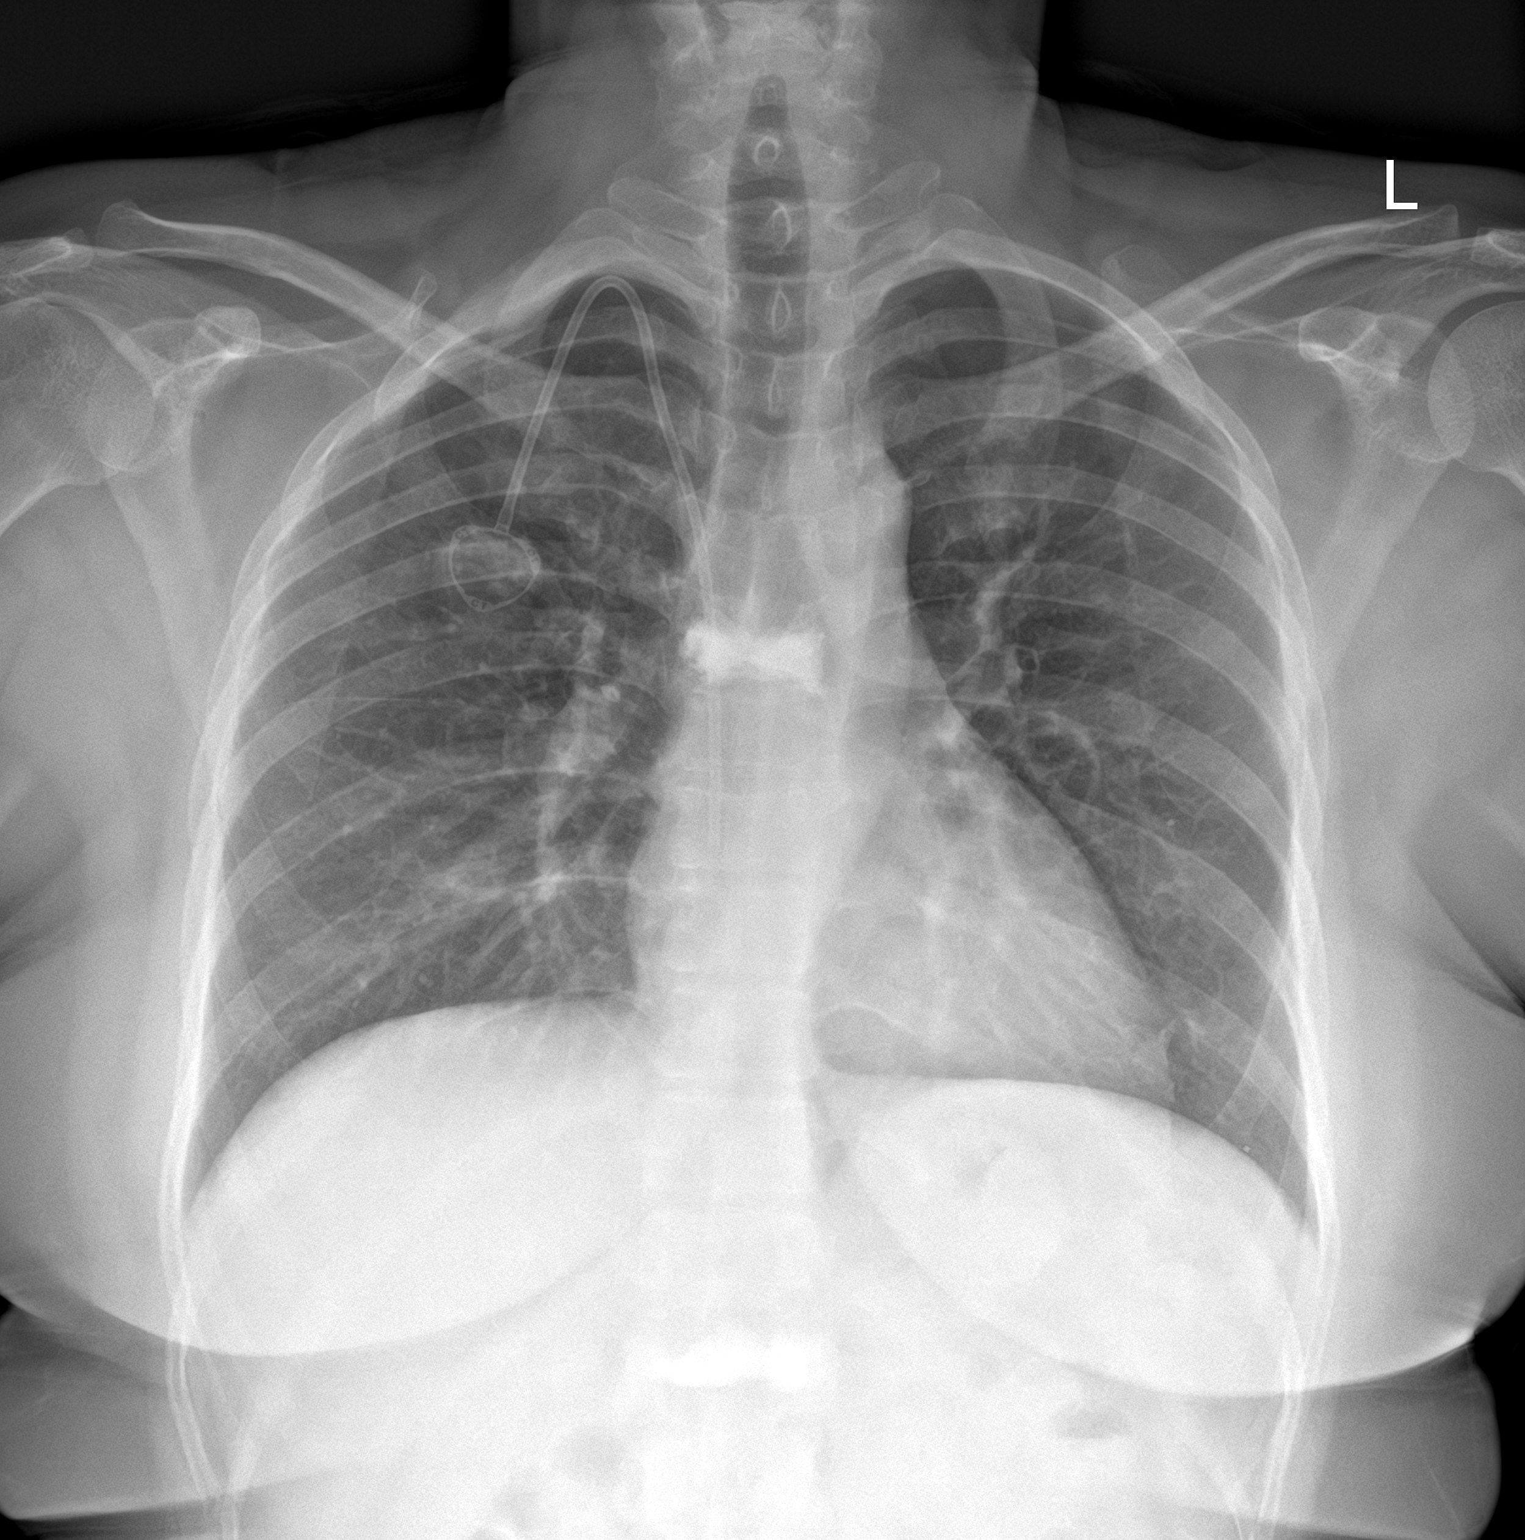

[chest lat]
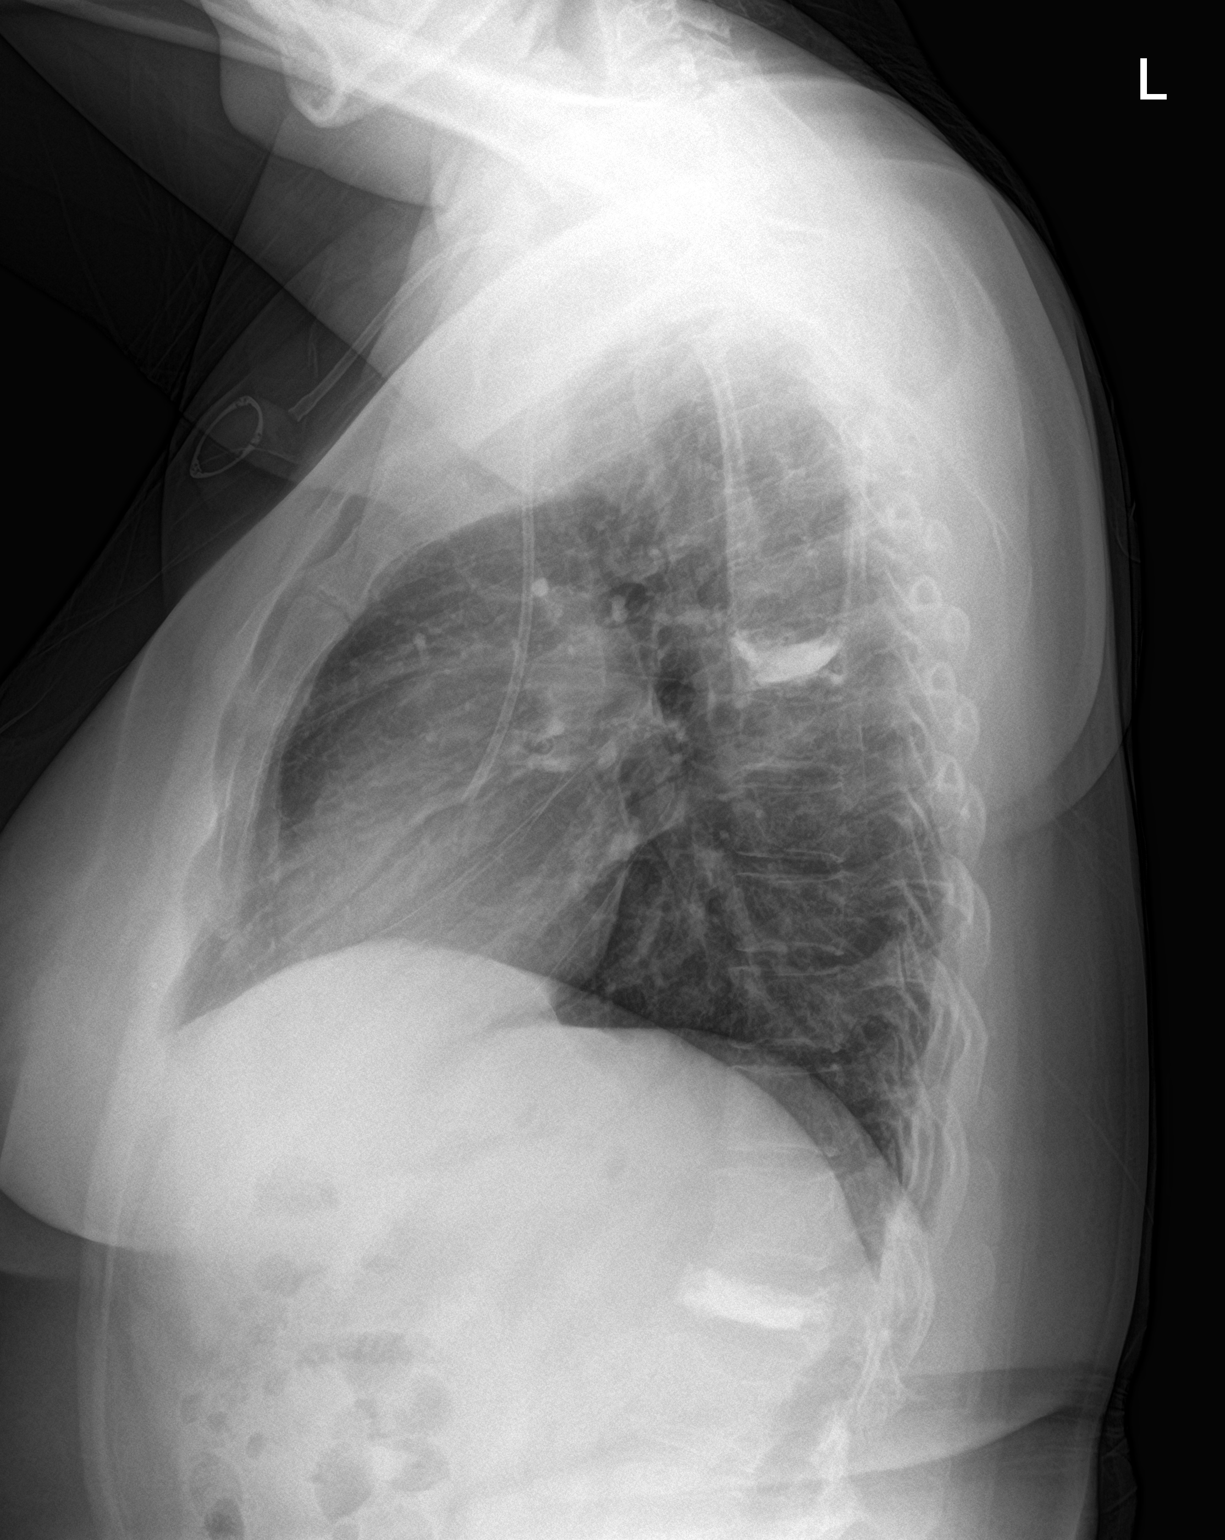

[2 of 2 positions shown; findings below may reference images not displayed]

FINDINGS: Right-sided central venous port is stable. Heart size is normal.
Mediastinum appears stable and within normal limits. Pulmonary
vasculature is normal. No focal consolidation identified. No pleural
effusion or pneumothorax. Previous vertebroplasty changes in the
thoracic and lumbar spine.
IMPRESSION: No acute intrathoracic process identified.

## 2021-02-05 IMAGING — CT CT HEAD W/O CM
4 series · 17 of 47 positions shown, 19 images · non-contrast
Comparison: MRI head [DATE]

CLINICAL DATA: Headache

EXAM:
CT HEAD WITHOUT CONTRAST
TECHNIQUE: Contiguous axial images were obtained from the base of the skull
through the vertex without intravenous contrast.

[Series 2: head wo · axial · 0.41mm/px · z∈[-91,+14]mm · 6 of 31 slices shown, 8 images]
[im 5/31  brain]
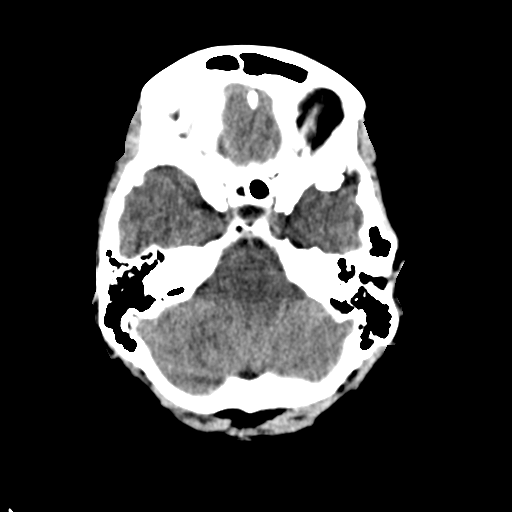
[im 5/31  bone]
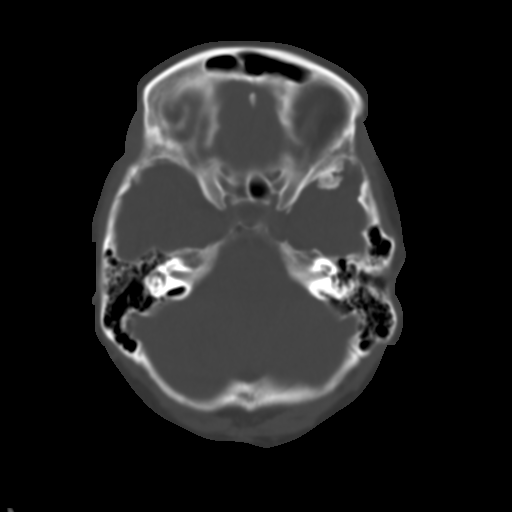
[im 9/31  brain]
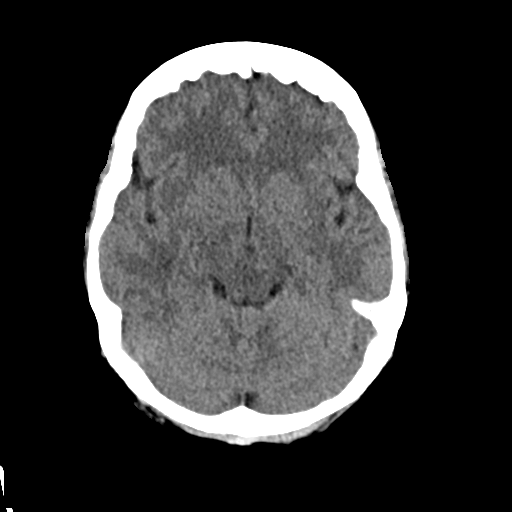
[im 13/31  brain]
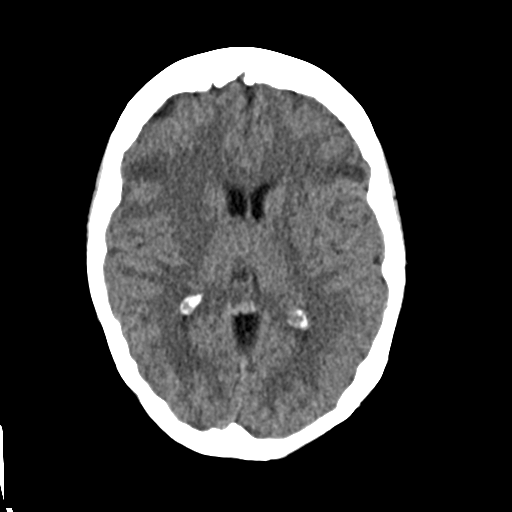
[im 18/31  brain]
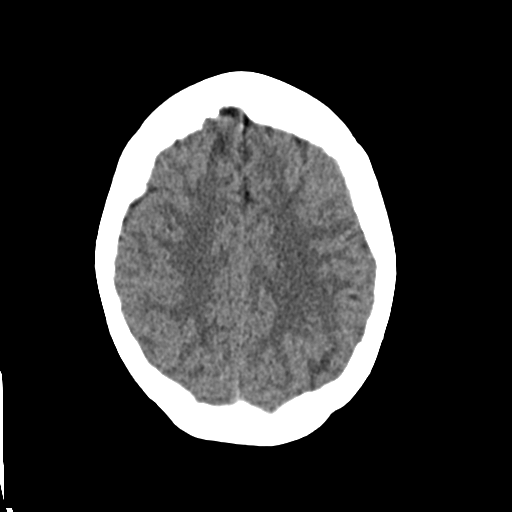
[im 22/31  brain]
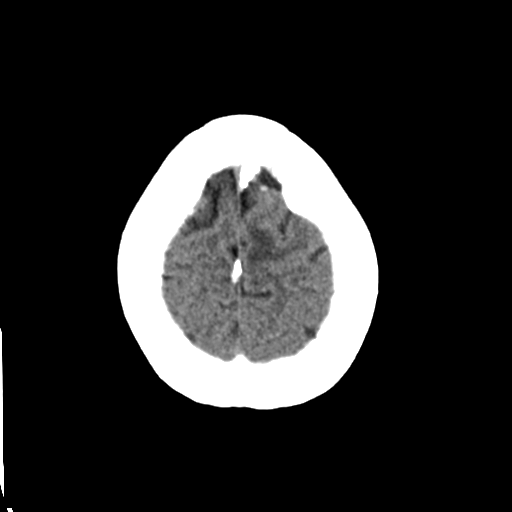
[im 22/31  bone]
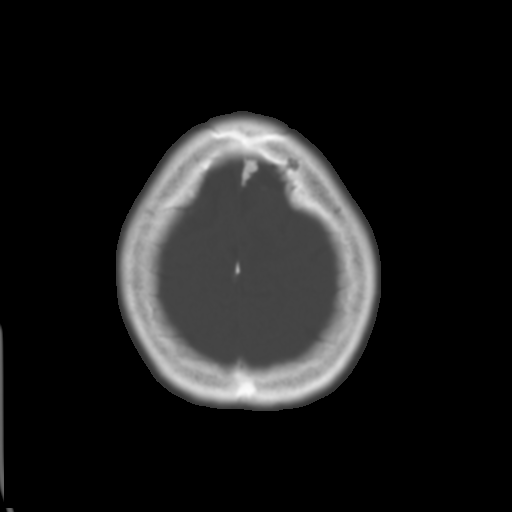
[im 26/31  brain]
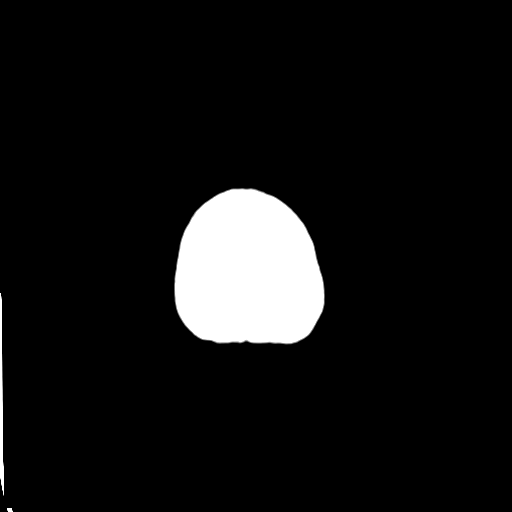

[Series 3: head bone · axial · 0.41mm/px · z∈[-107,-29]mm · 5 of 83 slices shown]
[im 8/83  bone]
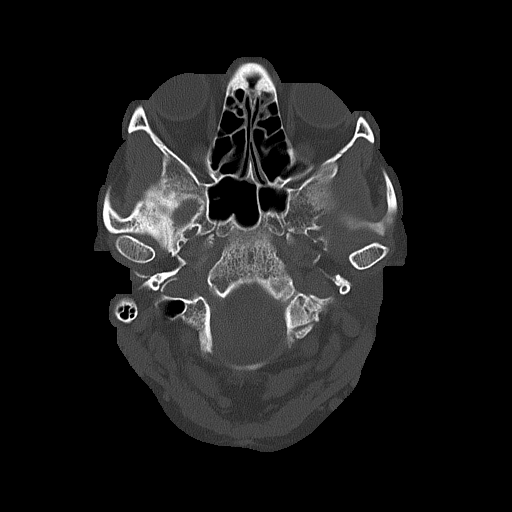
[im 16/83  bone]
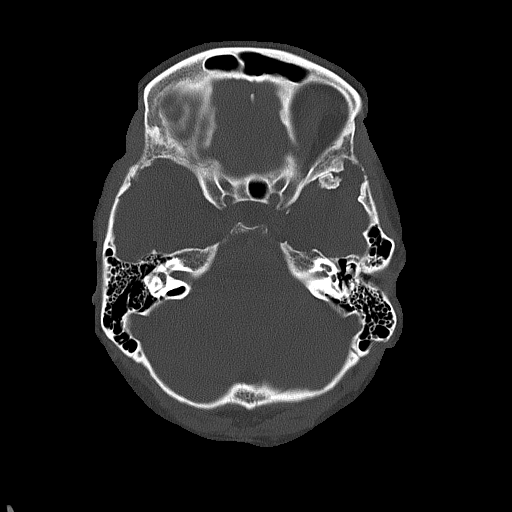
[im 28/83  bone]
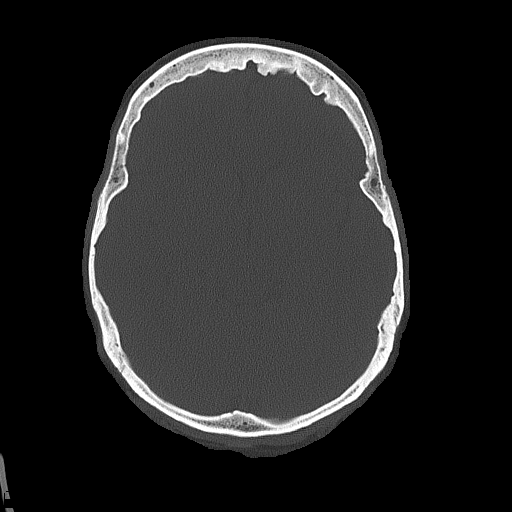
[im 36/83  bone]
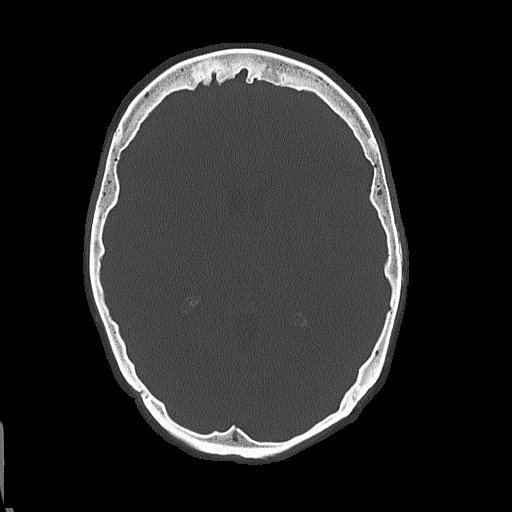
[im 47/83  bone]
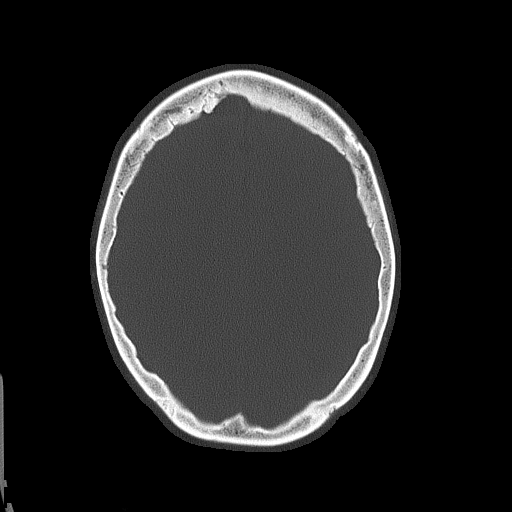

[Series 4: coronal soft tissue · coronal · 0.32mm/px · 3 of 66 slices shown]
[im 22/66  brain]
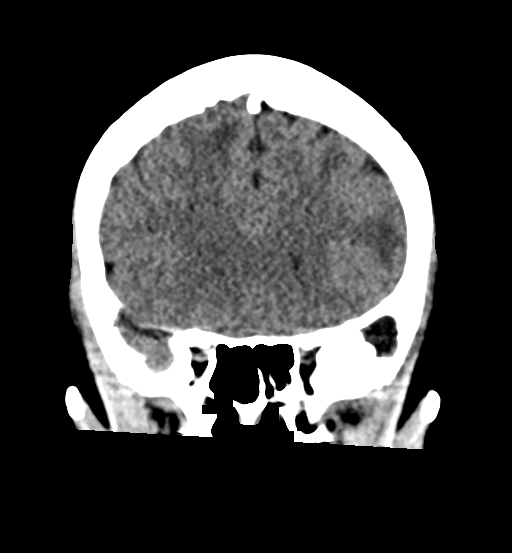
[im 29/66  brain]
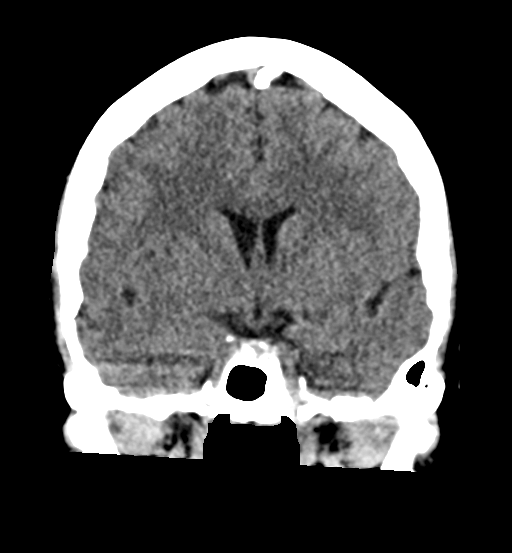
[im 37/66  brain]
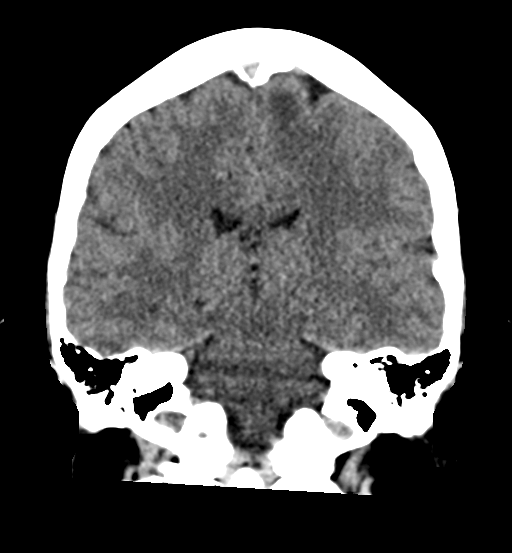

[Series 5: sagittal soft tissue · sagittal · 0.36mm/px · 3 of 53 slices shown]
[im 18/53  brain]
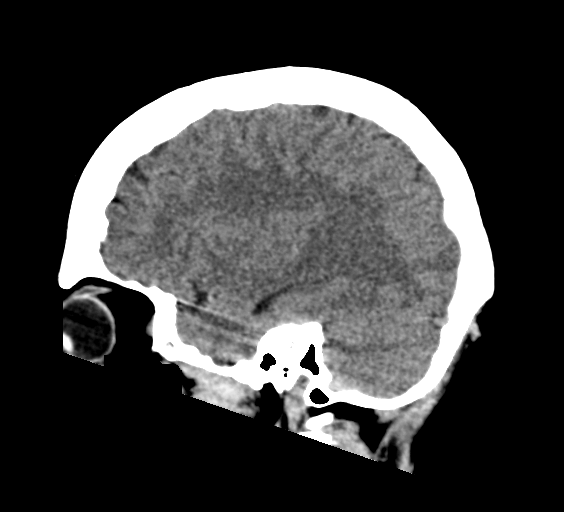
[im 27/53  brain]
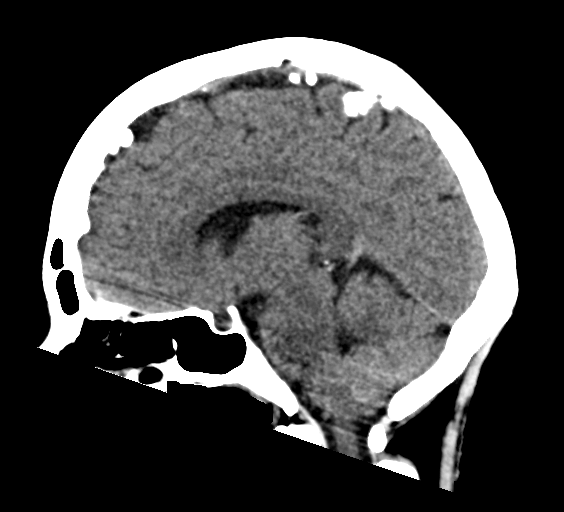
[im 35/53  brain]
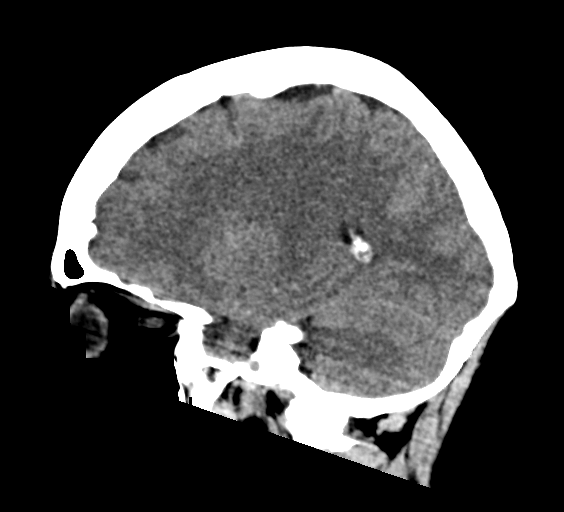

[17 of 47 positions shown; findings below may reference images not displayed]

FINDINGS: Brain: Multiple small areas of vasogenic edema identified involving
the bilateral anterior frontal lobes, high frontal lobes and left
parietal lobe. No acute intracranial hemorrhage. No herniation or
hydrocephalus.

Vascular: No hyperdense vessel or unexpected calcification.

Skull: Small lucent lesion in the left frontal calvarium consistent
with metastasis as seen on previous MRI. No definite new bony
lesions identified.

Sinuses/Orbits: No acute finding.

Other: None.
IMPRESSION: 1. Multiple (at least 5) small areas of vasogenic edema identified,
likely representing metastases given the history. No significant
mass effect or herniation. Recommend follow-up MRI brain with
contrast.
2. Stable small lucent lesion in the left frontal calvarium
consistent with metastasis as seen on previous MRI.

## 2021-02-05 MED ORDER — HEPARIN SOD (PORK) LOCK FLUSH 100 UNIT/ML IV SOLN
500.0000 [IU] | Freq: Once | INTRAVENOUS | Status: AC
  Administered 2021-02-05: 500 [IU] via INTRAVENOUS
  Filled 2021-02-05: qty 5

## 2021-02-05 MED ORDER — DEXAMETHASONE SODIUM PHOSPHATE 10 MG/ML IJ SOLN
10.0000 mg | Freq: Once | INTRAMUSCULAR | Status: AC
  Administered 2021-02-05: 10 mg via INTRAVENOUS
  Filled 2021-02-05: qty 1

## 2021-02-05 MED ORDER — PROCHLORPERAZINE EDISYLATE 10 MG/2ML IJ SOLN
10.0000 mg | Freq: Once | INTRAMUSCULAR | Status: AC
  Administered 2021-02-05: 10 mg via INTRAVENOUS
  Filled 2021-02-05: qty 2

## 2021-02-05 MED ORDER — LACTATED RINGERS IV BOLUS
1000.0000 mL | Freq: Once | INTRAVENOUS | Status: AC
  Administered 2021-02-05: 1000 mL via INTRAVENOUS

## 2021-02-05 MED ORDER — DIPHENHYDRAMINE HCL 50 MG/ML IJ SOLN
25.0000 mg | Freq: Once | INTRAMUSCULAR | Status: AC
  Administered 2021-02-05: 25 mg via INTRAVENOUS
  Filled 2021-02-05: qty 1

## 2021-02-05 MED ORDER — DEXAMETHASONE 4 MG PO TABS: 8.0000 mg | ORAL_TABLET | Freq: Two times a day (BID) | ORAL | 0 refills | Status: AC

## 2021-02-05 MED ORDER — PROCHLORPERAZINE MALEATE 10 MG PO TABS: 10.0000 mg | ORAL_TABLET | Freq: Four times a day (QID) | ORAL | 0 refills | Status: DC | PRN

## 2021-02-05 MED ORDER — OSELTAMIVIR PHOSPHATE 75 MG PO CAPS: 75.0000 mg | ORAL_CAPSULE | Freq: Two times a day (BID) | ORAL | 0 refills | Status: AC

## 2021-02-05 NOTE — ED Triage Notes (Signed)
Patient c/o fever, cough, body aches, headache, eye pain. Patient is a breast cancer patient on chemo.

## 2021-02-05 NOTE — ED Notes (Signed)
Pt transported to CT ?

## 2021-02-05 NOTE — ED Provider Notes (Signed)
Emergency Medicine Provider Triage Evaluation Note  Sophia [name] , a 40 y.o. female  was evaluated in triage.  Pt complains of fever, headache, eye pain, cough, body aches.  Patient is currently undergoing chemotherapy for breast cancer.  Review of Systems  Positive: Fever, headache, cough, body aches, eye pain Negative: N/V/D  Physical Exam  BP 118/89 (BP Location: Left Arm)   Pulse 98   Temp 98.2 F (36.8 C) (Oral)   Resp 15   Ht 5\' 2"  (1.575 m)   Wt 61.9 kg   SpO2 100%   BMI 24.96 kg/m  Gen:   Awake, no distress   Resp:  Normal effort  MSK:   Moves extremities without difficulty  Other:    Medical Decision Making  Medically screening exam initiated at 2:25 PM.  Appropriate orders placed.  Jerilyn Gillaspie was informed that the remainder of the evaluation will be completed by another provider, this initial triage assessment does not replace that evaluation, and the importance of remaining in the ED until their evaluation is complete.  Due to patient's chemotherapy status, instructed nursing staff to do septic work-up along with COVID/influenza swabs.  CT of the head due to the headache to rule out metastatic lesion   Versie Starks, PA-C 02/05/21 1426    Delman Kitten, MD 02/05/21 [phone]

## 2021-02-05 NOTE — ED Provider Notes (Signed)
[facility] Emergency Department Provider Note   ____________________________________________   Event Date/Time   First MD Initiated Contact with Patient 02/05/21 1509     (approximate)  I have reviewed the triage vital signs and the nursing notes.   HISTORY  Chief Complaint Generalized Body Aches    HPI Sophia [name] is a 40 y.o. female with past medical history of metastatic breast cancer who presents to the ED complaining of headache and body aches.  History is limited as patient is Spanish-speaking only patient reports that she has been feeling increasingly ill over the past 2 days with fevers as high as 101, diffuse headache, body aches, nausea, and vomiting.  She has had multiple family members recently tested positive for the flu and she has been taking Tylenol as needed for fever and headache with minimal relief.  She denies any associated vision changes, speech changes, numbness, or weakness.  She does report feeling nauseous and has a hard time eating or drinking anything for the past couple of days.  She endorses dysuria but denies abdominal pain, flank pain, or diarrhea.  She has known bony metastasis of her breast cancer.        Past Medical History:  Diagnosis Date   Anxiety    Breast cancer ([place])    with mets   Cancer ([place])    Colitis    COVID-19 in immunocompromised patient [facility])    Family history of colon cancer    Vertigo     Patient Active Problem List   Diagnosis Date Noted   C. difficile diarrhea 06/07/2020   E coli infection 06/07/2020   Genetic testing 04/16/2020   Family history of colon cancer    Acute colitis 10/01/2019   Abnormal LFTs 10/01/2019   Bone metastases ([place]) 09/26/2019   Encounter for monoclonal antibody treatment for malignancy    Goals of care, counseling/discussion    Neoplasm related pain    Metastatic breast cancer ([place]) 08/29/2019   Primary cancer of left breast with metastasis to other  site ([place]) 08/28/2019   Multiple pathological fractures 08/28/2019   Anxiety 08/28/2019   Preoperative clearance 08/28/2019   Leukocytosis 08/28/2019    Past Surgical History:  Procedure Laterality Date   BREAST BIOPSY Left 08/14/2019   [place] bx of mass, coil marker, path pending   BREAST BIOPSY Left 08/14/2019   [place] bx of LN, hydromarker, path pending   BREAST BIOPSY Left 08/14/2019   affirm bx of calcs, x marker, path pending   ESOPHAGOGASTRODUODENOSCOPY (EGD) WITH PROPOFOL N/A 10/05/2019   Procedure: ESOPHAGOGASTRODUODENOSCOPY (EGD) WITH PROPOFOL;  Surgeon: Lin Landsman, MD;  Location: [place];  Service: Gastroenterology;  Laterality: N/A;   FLEXIBLE SIGMOIDOSCOPY N/A 10/05/2019   Procedure: FLEXIBLE SIGMOIDOSCOPY;  Surgeon: Lin Landsman, MD;  Location: [facility] ENDOSCOPY;  Service: Gastroenterology;  Laterality: N/A;   INTRAMEDULLARY (IM) NAIL INTERTROCHANTERIC Right 09/01/2019   Procedure: INTRAMEDULLARY (IM) NAIL INTERTROCHANTRIC AND RADIOFREQUENCY ABLATION;  Surgeon: Hessie Knows, MD;  Location: [facility] ORS;  Service: Orthopedics;  Laterality: Right;   KYPHOPLASTY N/A 08/29/2019   Procedure: KYPHOPLASTY T6, L1,L4 ,  RADIOFREQUENCY ABLATION;  Surgeon: Hessie Knows, MD;  Location: [facility] ORS;  Service: Orthopedics;  Laterality: N/A;   KYPHOPLASTY Right 09/01/2019   Procedure: Right Sacral Radiofrequency Ablation and Cement Augmentation, Right sacrum and iliac crest;  Surgeon: Hessie Knows, MD;  Location: [facility] ORS;  Service: Orthopedics;  Laterality: Right;   PORTA CATH INSERTION N/A 08/28/2019   Procedure: PORTA CATH  INSERTION;  Surgeon: Algernon Huxley, MD;  Location: [place] [facility];  Service: Cardiovascular;  Laterality: N/A;    Prior to Admission medications   Medication Sig Start Date End Date Taking? Authorizing Provider  dexamethasone (DECADRON) 4 MG tablet Take 2 tablets (8 mg total) by mouth 2 (two) times daily for 7 days. 02/05/21 02/12/21 Yes Blake Divine, MD   oseltamivir (TAMIFLU) 75 MG capsule Take 1 capsule (75 mg total) by mouth 2 (two) times daily for 5 days. 02/05/21 02/10/21 Yes Blake Divine, MD  prochlorperazine (COMPAZINE) 10 MG tablet Take 1 tablet (10 mg total) by mouth every 6 (six) hours as needed for nausea or vomiting. 02/05/21  Yes Blake Divine, MD  albuterol (VENTOLIN HFA) 108 (90 Base) MCG/ACT inhaler Inhale 2 puffs into the lungs every 6 (six) hours as needed for wheezing or shortness of breath. Patient not taking: No sig reported 08/13/20   Borders, Kirt Boys, NP  calcium-vitamin D (OSCAL WITH D) 500-200 MG-UNIT tablet Take 1 tablet by mouth 2 (two) times daily. Patient not taking: No sig reported 10/08/20   Borders, Kirt Boys, NP  cyclobenzaprine (FLEXERIL) 5 MG tablet Take 1 tablet (5 mg total) by mouth 3 (three) times daily as needed for muscle spasms. Patient not taking: No sig reported 09/03/20   Borders, Kirt Boys, NP  diphenoxylate-atropine (LOMOTIL) 2.5-0.025 MG tablet Take 1 tablet by mouth 4 (four) times daily as needed for diarrhea or loose stools. Patient not taking: No sig reported 08/13/20   Borders, Kirt Boys, NP  DULoxetine (CYMBALTA) 30 MG capsule Take 1 capsule (30 mg total) by mouth daily. Patient not taking: No sig reported 12/23/20   Borders, Kirt Boys, NP  ergocalciferol (VITAMIN D2) 1.25 MG (50000 UT) capsule Take 1 capsule (50,000 Units total) by mouth once a week. For 4 weeks. Then 1 capsule once monthly for 4 months Patient not taking: No sig reported 07/12/20   Sindy Guadeloupe, MD  gabapentin (NEURONTIN) 600 MG tablet Take 1 tablet (600 mg total) by mouth 3 (three) times daily. Patient not taking: Reported on 01/28/2021 12/17/20   Sindy Guadeloupe, MD  lidocaine-prilocaine (EMLA) cream Apply 1 application topically as needed. Apply small amount to port site at least 1 hour prior to it being accessed, cover with plastic wrap Patient not taking: Reported on 01/28/2021 02/20/20   Sindy Guadeloupe, MD  ondansetron  (ZOFRAN) 8 MG tablet Take 1 tablet (8 mg total) by mouth every 8 (eight) hours as needed for nausea or vomiting. Patient not taking: No sig reported 07/13/20   Borders, Kirt Boys, NP  oxycodone (OXY-IR) 5 MG capsule Take 1-2 capsules (5-10 mg total) by mouth every 4 (four) hours as needed. Patient not taking: Reported on 01/28/2021 01/20/21   Sindy Guadeloupe, MD  oxyCODONE (OXYCONTIN) 10 mg 12 hr tablet Take 1 tablet (10 mg total) by mouth every 12 (twelve) hours. Patient not taking: Reported on 01/28/2021 01/21/21   Sindy Guadeloupe, MD  potassium chloride SA (KLOR-CON) 20 MEQ tablet Take 1 tablet (20 mEq total) by mouth daily for 7 days. Patient not taking: No sig reported 10/08/20 11/19/20  Borders, Kirt Boys, NP  potassium chloride SA (KLOR-CON) 20 MEQ tablet Take 1 tablet (20 mEq total) by mouth once for 1 dose. 01/21/21 01/21/21  Sindy Guadeloupe, MD    Allergies Patient has no known allergies.  Family History  Problem Relation Age of Onset   Colon cancer Maternal Uncle  Social History Social History   Tobacco Use   Smoking status: Never   Smokeless tobacco: Never  Vaping Use   Vaping Use: Never used  Substance Use Topics   Alcohol use: Not Currently   Drug use: Not Currently    Review of Systems  Constitutional: Positive for fever/chills, positive for generalized weakness and malaise. Eyes: No visual changes. ENT: No sore throat. Cardiovascular: Denies chest pain. Respiratory: Denies shortness of breath.  Negative for cough. Gastrointestinal: No abdominal pain.  Positive for nausea and vomiting.  No diarrhea.  No constipation. Genitourinary: Negative for dysuria. Musculoskeletal: Negative for back pain.  Positive for diffuse body aches. Skin: Negative for rash. Neurological: Positive for headache, negative for focal weakness or numbness.  ____________________________________________   PHYSICAL EXAM:  VITAL SIGNS: ED Triage Vitals  Enc Vitals Group     BP 02/05/21 1422  118/89     Pulse Rate 02/05/21 1422 98     Resp 02/05/21 1422 15     Temp 02/05/21 1422 98.2 F (36.8 C)     Temp Source 02/05/21 1422 Oral     SpO2 02/05/21 1422 100 %     Weight 02/05/21 1423 136 lb 7.4 oz (61.9 kg)     Height 02/05/21 1423 5\' 2"  (1.575 m)     Head Circumference --      Peak Flow --      Pain Score 02/05/21 1422 10     Pain Loc --      Pain Edu? --      Excl. in [place]? --     Constitutional: Alert and oriented. Eyes: Conjunctivae are normal.  Pupils equal, round, and reactive to light bilaterally. Head: Atraumatic. Nose: No congestion/rhinnorhea. Mouth/Throat: Mucous membranes are moist. Neck: Normal ROM, no meningismus. Cardiovascular: Normal rate, regular rhythm. Grossly normal heart sounds.  2+ radial pulses bilaterally.  Right chest wall Mediport noted. Respiratory: Normal respiratory effort.  No retractions. Lungs CTAB. Gastrointestinal: Soft and nontender. No distention. Genitourinary: deferred Musculoskeletal: No lower extremity tenderness nor edema. Neurologic:  Normal speech and language. No gross focal neurologic deficits are appreciated. Skin:  Skin is warm, dry and intact. No rash noted. Psychiatric: Mood and affect are normal. Speech and behavior are normal.  ____________________________________________   LABS (all labs ordered are listed, but only abnormal results are displayed)  Labs Reviewed  RESP PANEL BY RT-PCR (FLU A&B, COVID) ARPGX2 - Abnormal; Notable for the following components:      Result Value   Influenza A by PCR POSITIVE (*)    All other components within normal limits  COMPREHENSIVE METABOLIC PANEL - Abnormal; Notable for the following components:   Potassium 3.3 (*)    CO2 21 (*)    Creatinine, Ser 0.34 (*)    Calcium 7.4 (*)    Albumin 3.2 (*)    All other components within normal limits  CULTURE, BLOOD (ROUTINE X 2)  CULTURE, BLOOD (ROUTINE X 2)  LACTIC ACID, PLASMA  CBC WITH DIFFERENTIAL/PLATELET  URINALYSIS,  ROUTINE W REFLEX MICROSCOPIC    PROCEDURES  Procedure(s) performed (including Critical Care):  Procedures   ____________________________________________   INITIAL IMPRESSION / ASSESSMENT AND PLAN / ED COURSE      40 year old female with past medical history of metastatic breast cancer presents to the ED complaining of fever, headache, body aches, nausea, and vomiting for the past 2 days.  Vital signs are reassuring, patient afebrile here in the ED and is currently well-appearing, no signs of sepsis at  this time.  Viral testing is positive for influenza, which likely explains her infectious symptoms.  Chest x-ray reviewed by me and shows no infiltrate, edema, or effusion.  We will treat symptomatically with migraine cocktail.  CT head was performed and does show areas of vasogenic edema likely consistent with multiple small metastases.  She has no focal neurologic deficits on exam and there is no evidence of mass-effect or midline shift.  Findings were discussed with Dr. Grayland Ormond of oncology, who recommends giving dose of Decadron here in the ED with prescription for 8 mg of Decadron twice daily.  He will be in contact with patient's primary oncologist as well as radiation oncology for further treatment.  If remainder of work-up is unremarkable and patient is feeling better following migraine cocktail, she would be appropriate for discharge home with close outpatient follow-up.  Patient reports feeling much better following IV fluids, Compazine, Benadryl, and Decadron.  She is appropriate for close outpatient follow-up with oncology and will be prescribed 8 mg of Decadron twice daily per Dr. Grayland Ormond along with Tamiflu and Compazine for use as needed.  She was counseled to return to the ED for new or worsening symptoms, patient agrees with plan.      ____________________________________________   FINAL CLINICAL IMPRESSION(S) / ED DIAGNOSES  Final diagnoses:  Influenza  Acute  nonintractable headache, unspecified headache type  Cerebral edema (HCC)  Malignant neoplasm of female breast, unspecified estrogen receptor status, unspecified laterality, unspecified site of breast [facility])     ED Discharge Orders          Ordered    dexamethasone (DECADRON) 4 MG tablet  2 times daily        02/05/21 1753    prochlorperazine (COMPAZINE) 10 MG tablet  Every 6 hours PRN        02/05/21 1753    oseltamivir (TAMIFLU) 75 MG capsule  2 times daily        02/05/21 1753             Note:  This document was prepared using Dragon voice recognition software and may include unintentional dictation errors.    Blake Divine, MD 02/05/21 1755

## 2021-02-08 ENCOUNTER — Telehealth: Payer: Self-pay | Admitting: *Deleted

## 2021-02-08 ENCOUNTER — Other Ambulatory Visit: Payer: Self-pay | Admitting: *Deleted

## 2021-02-08 DIAGNOSIS — R9402 Abnormal brain scan: Secondary | ICD-10-CM

## 2021-02-08 DIAGNOSIS — C50912 Malignant neoplasm of unspecified site of left female breast: Secondary | ICD-10-CM

## 2021-02-08 NOTE — Telephone Encounter (Signed)
Called the patient and let her know that dr Janese Banks said that there is something abnormal on ct and mri is needed to get the best picture of what is going on. She has an appt for 11/23 at 11 am, needs to come 30 min early. She will do that. Also she needs to take the dexamethasone that ER sent in already and I [facility] the [facility] and I told them to pay for the drugs on the [employer]. She will get the meds and I told her that she needs to eat something and take it in morning and then right after lunch. She understands

## 2021-02-09 ENCOUNTER — Ambulatory Visit
Admission: RE | Admit: 2021-02-09 | Discharge: 2021-02-09 | Disposition: A | Discharge status: Home / Self Care | Payer: Self-pay | Source: Ambulatory Visit | Attending: Oncology | Admitting: Oncology

## 2021-02-09 DIAGNOSIS — R9402 Abnormal brain scan: Secondary | ICD-10-CM | POA: Insufficient documentation

## 2021-02-09 DIAGNOSIS — C50912 Malignant neoplasm of unspecified site of left female breast: Secondary | ICD-10-CM | POA: Insufficient documentation

## 2021-02-09 IMAGING — MR MR HEAD WO/W CM
14 series · 48 of 48 positions shown · IV contrast (6ml Gadavist)
Comparison: CT head [DATE].  MRI head [DATE]

CLINICAL DATA: History of metastatic breast cancer. Abnormal CT
head

EXAM:
MRI HEAD WITHOUT AND WITH CONTRAST
TECHNIQUE: Multiplanar, multiecho pulse sequences of the brain and surrounding
structures were obtained without and with intravenous contrast.
CONTRAST:  6mL GADAVIST GADOBUTROL 1 MMOL/ML IV SOLN

[Series 5: ax dwi_tracew · axial · 3.0mm · 0.65mm/px · z∈[-130,+25]mm · 3 of 48 slices shown]
[im 1/48]
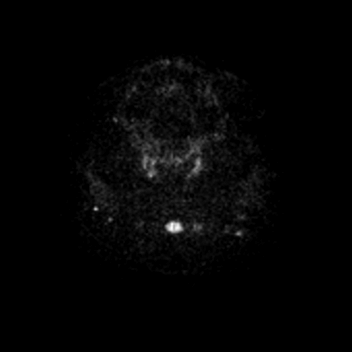
[im 24/48]
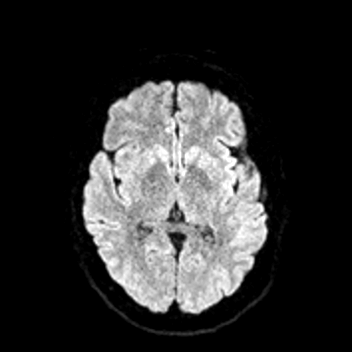
[im 48/48]
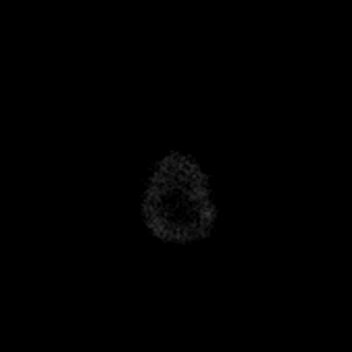

[Series 6: ax dwi_adc · axial · 3.0mm · 0.65mm/px · z∈[-130,+15]mm · 3 of 43 slices shown]
[im 1/43]
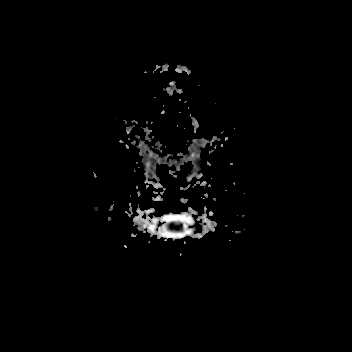
[im 22/43]
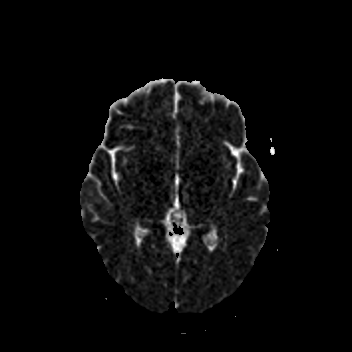
[im 43/43]
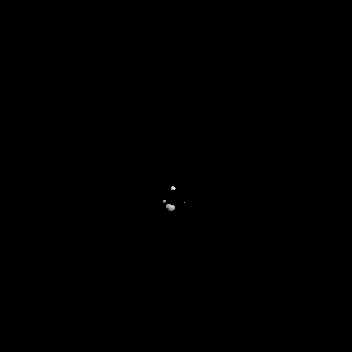

[Series 7: cor dwi_tracew · coronal · 5.0mm · 0.68mm/px · 2 of 36 slices shown]
[im 1/36]
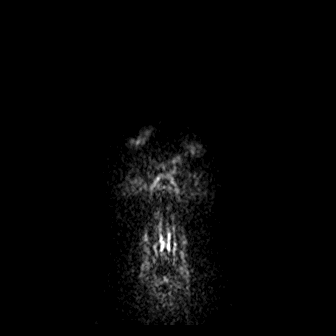
[im 36/36]
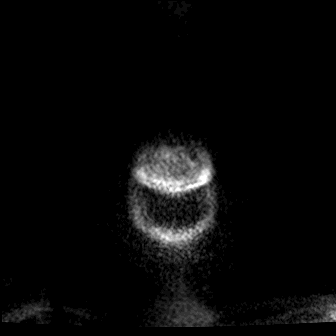

[Series 8: cor dwi_adc · coronal · 5.0mm · 0.68mm/px · 2 of 36 slices shown]
[im 1/36]
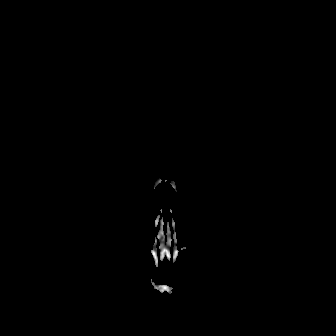
[im 36/36]
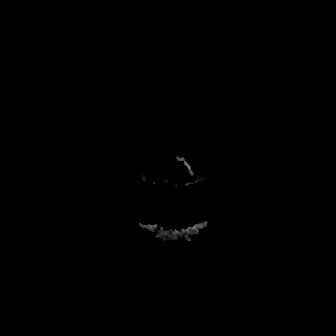

[Series 9: T1 · sagittal · 5.0mm · 0.62mm/px · 1 of 21 slices shown (1 of 2)]
[im 1/21]
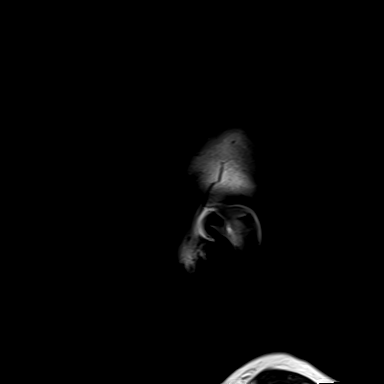

[Series 10: T2 · axial · 5.0mm · 0.53mm/px · z∈[-132,+24]mm · 2 of 27 slices shown]
[im 1/27]
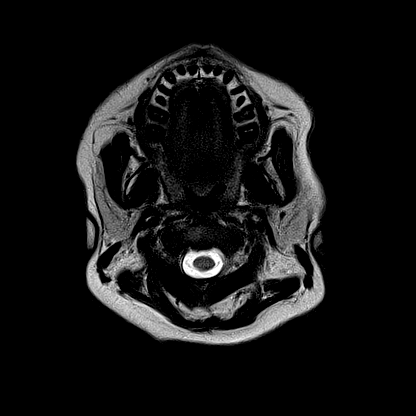
[im 27/27]
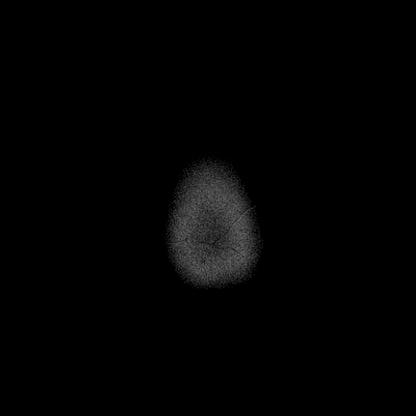

[Series 11: FLAIR · axial · 3.0mm · 0.53mm/px · z∈[-135,+27]mm · 3 of 55 slices shown]
[im 1/55]
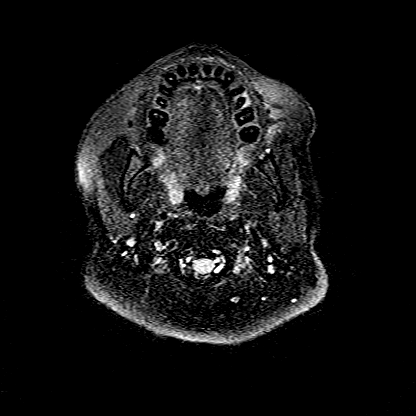
[im 28/55]
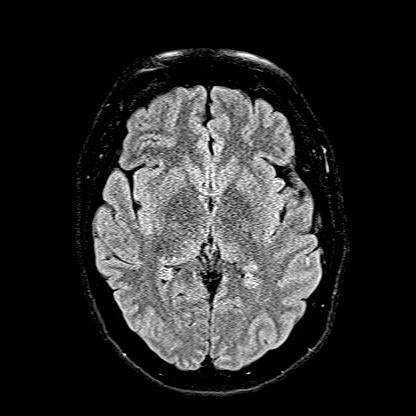
[im 55/55]
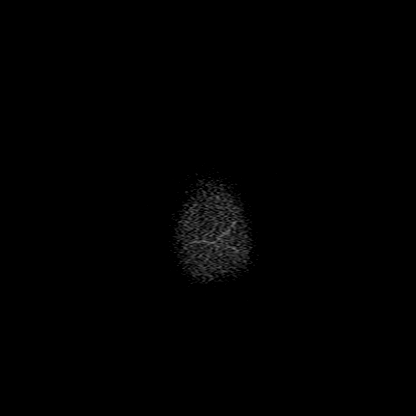

[Series 13: pha_images · axial · 3.0mm · 0.90mm/px · z∈[-130,+22]mm · 3 of 52 slices shown]
[im 1/52]
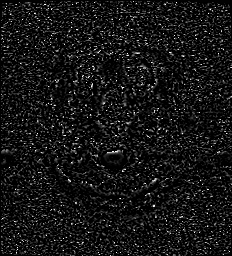
[im 26/52]
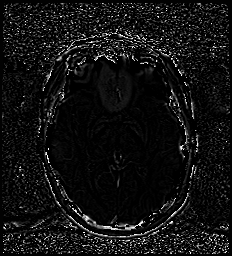
[im 52/52]
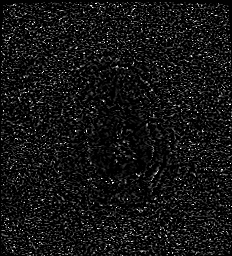

[Series 14: swi_images · axial · 3.0mm · 0.90mm/px · z∈[-130,+22]mm · 3 of 52 slices shown]
[im 1/52]
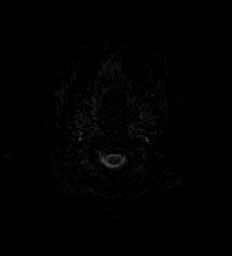
[im 26/52]
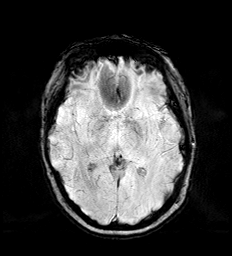
[im 52/52]
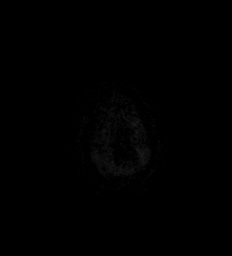

[Series 16: T1 · axial · 1.0mm · 0.98mm/px · z∈[-136,+38]mm · 11 of 175 slices shown (2 of 2)]
[im 1/175]
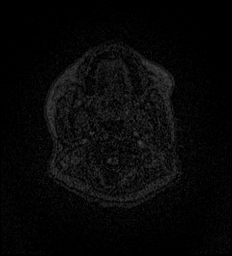
[im 18/175]
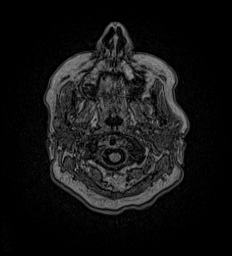
[im 35/175]
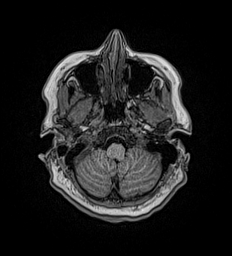
[im 53/175]
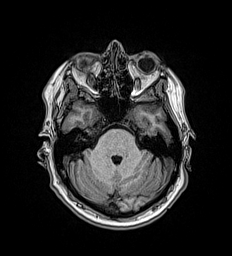
[im 70/175]
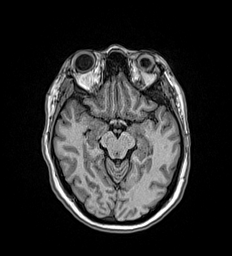
[im 88/175]
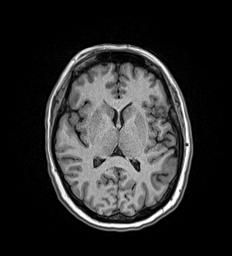
[im 105/175]
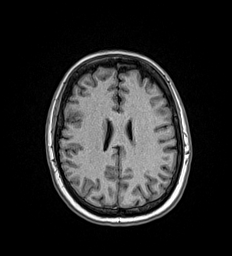
[im 122/175]
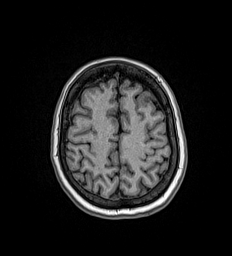
[im 140/175]
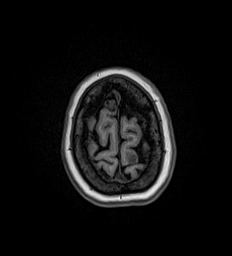
[im 157/175]
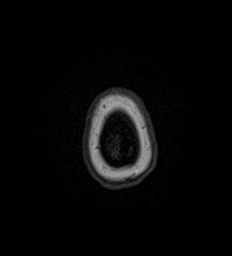
[im 175/175]
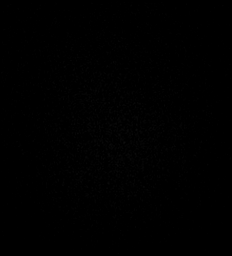

[Series 17: T2 post-contrast · coronal · 5.0mm · 0.57mm/px · 2 of 29 slices shown]
[im 1/29]
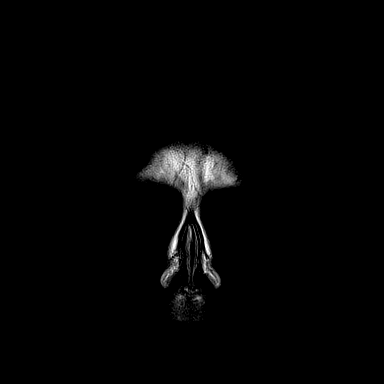
[im 29/29]
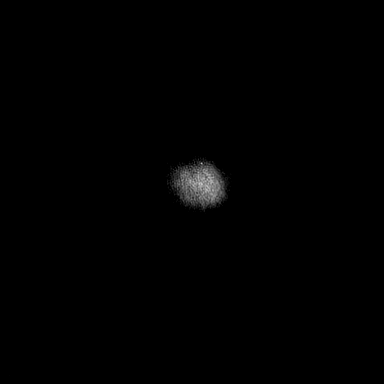

[Series 18: T1 post-contrast · axial · 1.0mm · 0.98mm/px · z∈[-136,+38]mm · 10 of 174 slices shown (1 of 3)]
[im 1/174]
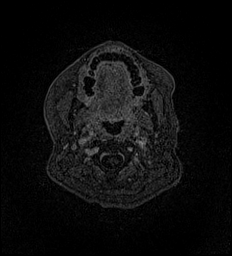
[im 20/174]
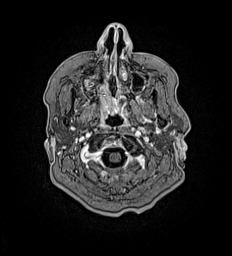
[im 39/174]
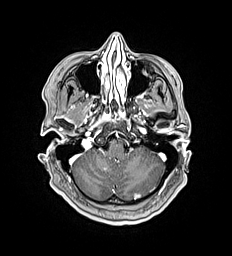
[im 58/174]
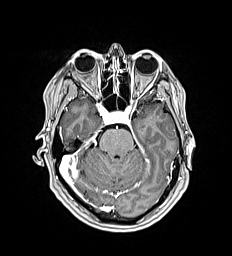
[im 77/174]
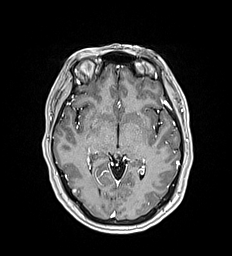
[im 97/174]
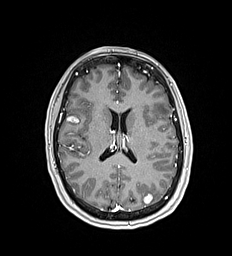
[im 116/174]
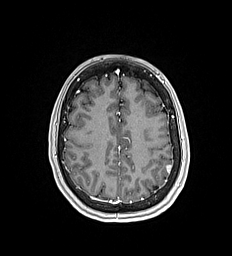
[im 135/174]
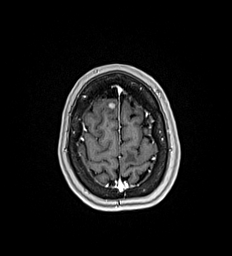
[im 154/174]
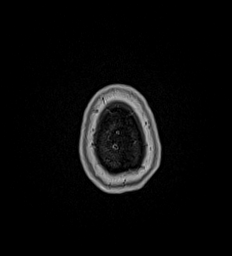
[im 174/174]
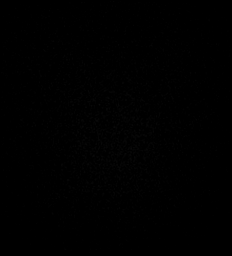

[Series 19: T1 post-contrast · coronal · 5.0mm · 0.57mm/px · 2 of 29 slices shown (2 of 3)]
[im 1/29]
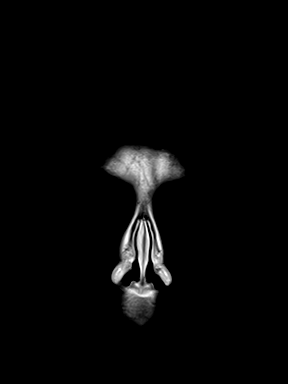
[im 29/29]
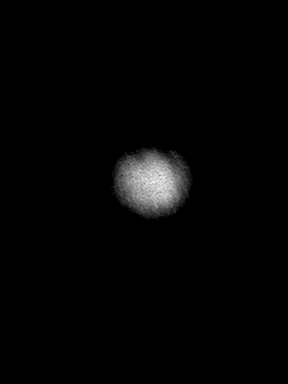

[Series 20: T1 post-contrast · sagittal · 5.0mm · 0.62mm/px · 1 of 21 slices shown (3 of 3)]
[im 1/21]
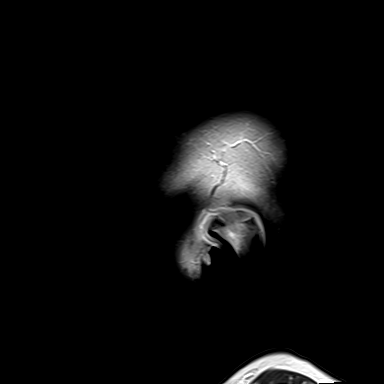

[48 of 48 positions shown; findings below may reference images not displayed]

FINDINGS: Brain: Multiple enhancing metastatic deposits in the brain. Multiple
lesions show mild edema. Small area of hemorrhage in the right
frontal lesion. No significant mass-effect or midline shift.

5 mm enhancing lesion left inferior cerebellum. Axial image number
18

5 x 7 mm enhancing left posterior cerebellum.  Axial image 38

3 mm enhancing lesion right cerebellum.  Axial image 39

8 mm lesion right posterior cerebellum.  Axial image 44

5 mm enhancing lesion right lateral cerebellum axial image 45

9 mm enhancing lesion right occipital lobe axial image 71

4 mm lesion right occipital parietal lobe.  Axial image 75

14 mm enhancing lesion with slight hemorrhage in the right mid
frontal lobe. Axial image 100

12 mm enhancing lesion right frontal convexity.  Axial image 139

12 mm enhancing lesion left posterior parietal lobe.  Axial image 99

6 mm enhancing lesion left frontal lobe.  Axial image 107

9 mm enhancing lesion left lateral parietal lobe.  Axial image 114

8 mm enhancing lesion left frontal lobe.  Axial image 120

10 mm enhancing lesion left frontal convexity.  Axial image 142

Ventricle size normal.  Negative for acute infarct.

Vascular: Normal arterial flow voids

Skull and upper cervical spine: Left frontal enhancing lesion has
improved in the interval compatible with metastatic disease. T1
hyperintense lesion in the body of C2 is unchanged and may represent
hemangioma.

Sinuses/Orbits: Mild mucosal edema paranasal sinuses. Negative orbit

Other: None
IMPRESSION: Widespread metastatic disease to the brain as above. Mild associated
edema with multiple lesions. Small area of hemorrhage right frontal
metastasis.

## 2021-02-09 MED ORDER — GADOBUTROL 1 MMOL/ML IV SOLN
6.0000 mL | Freq: Once | INTRAVENOUS | Status: AC | PRN
  Administered 2021-02-09: 6 mL via INTRAVENOUS

## 2021-02-10 LAB — CULTURE, BLOOD (ROUTINE X 2): Culture: NO GROWTH

## 2021-02-14 ENCOUNTER — Other Ambulatory Visit: Payer: Self-pay

## 2021-02-14 ENCOUNTER — Ambulatory Visit
Admission: RE | Admit: 2021-02-14 | Discharge: 2021-02-14 | Disposition: A | Discharge status: Home / Self Care | Payer: Self-pay | Source: Ambulatory Visit | Attending: Radiation Oncology | Admitting: Radiation Oncology

## 2021-02-14 VITALS — BP 123/81 | HR 96 | Temp 96.7°F | Wt 135.9 lb

## 2021-02-14 DIAGNOSIS — C50911 Malignant neoplasm of unspecified site of right female breast: Secondary | ICD-10-CM | POA: Insufficient documentation

## 2021-02-14 DIAGNOSIS — R609 Edema, unspecified: Secondary | ICD-10-CM | POA: Insufficient documentation

## 2021-02-14 DIAGNOSIS — Z171 Estrogen receptor negative status [ER-]: Secondary | ICD-10-CM | POA: Insufficient documentation

## 2021-02-14 DIAGNOSIS — C7951 Secondary malignant neoplasm of bone: Secondary | ICD-10-CM | POA: Insufficient documentation

## 2021-02-14 DIAGNOSIS — C7931 Secondary malignant neoplasm of brain: Secondary | ICD-10-CM | POA: Insufficient documentation

## 2021-02-14 MED ORDER — LANSOPRAZOLE 30 MG PO CPDR: 30.0000 mg | DELAYED_RELEASE_CAPSULE | Freq: Every day | ORAL | 1 refills | Status: DC

## 2021-02-14 MED ORDER — DEXAMETHASONE 4 MG PO TABS: 4.0000 mg | ORAL_TABLET | Freq: Two times a day (BID) | ORAL | 0 refills | Status: DC

## 2021-02-14 NOTE — Progress Notes (Signed)
Radiation Oncology Follow up Note old patient new area brain metastasis  Name: Sophia [name]   Date:   02/14/2021 MRN:  [id number] DOB: September 18, 1980    This 40 y.o. female presents to the clinic today for evaluation of brain metastasis and patient with known stage IV breast cancer with known and previously treated bone metastasis.  REFERRING PROVIDER: Sindy Guadeloupe, MD  HPI: Patient is a 40 year old Spanish-speaking female well-known to department.  She was post to begin a single fraction course of palliative radiation therapy to her distal right femur.  Recently MRI scan was performed.  Of her brain showing widespread metastatic disease with mild associated edema with multiple lesions and a small area of hemorrhagic involvement of the right frontal metastasis.  She is having some pressure and headaches at this time has not been started on steroid therapy.  She specifically denies any change in visual fields or any focal neurologic deficits.  COMPLICATIONS OF TREATMENT: none  FOLLOW UP COMPLIANCE: keeps appointments   PHYSICAL EXAM:  BP 123/81   Pulse 96   Temp (!) 96.7 F (35.9 C) (Tympanic)   Wt 135 lb 14.4 oz (61.6 kg)   BMI 24.86 kg/m  Crude visual fields were normal range motor or sensory and DTR levels are equal and symmetric in the upper lower extremities proprioception is intact.  Well-developed well-nourished patient in NAD. HEENT reveals PERLA, EOMI, discs not visualized.  Oral cavity is clear. No oral mucosal lesions are identified. Neck is clear without evidence of cervical or supraclavicular adenopathy. Lungs are clear to A&P. Cardiac examination is essentially unremarkable with regular rate and rhythm without murmur rub or thrill. Abdomen is benign with no organomegaly or masses noted. Motor sensory and DTR levels are equal and symmetric in the upper and lower extremities. Cranial nerves II through XII are grossly intact. Proprioception is intact. No peripheral  adenopathy or edema is identified. No motor or sensory levels are noted. Crude visual fields are within normal range.  RADIOLOGY RESULTS: MRI reviewed compatible with above-stated findings  PLAN: At this time I will put off her single fraction palliative treatment to her distal right femur.  I would like to go ahead and simulate her for whole brain radiation.  Risks and benefits of treatment occluding hair loss fatigue alteration of blood counts skin reaction all were reviewed in detail with the patient.  I will plan on delivering 30 Gray in 10 fractions.  I have set her up for simulation tomorrow.  Patient comprehends my recommendations well.  I have started her on 4 mg of Decadron twice a day.  I am also starting her on Prevacid to protect her stomach.  I would like to take this opportunity to thank you for allowing me to participate in the care of your patient.Noreene Filbert, MD

## 2021-02-15 ENCOUNTER — Ambulatory Visit
Admission: RE | Admit: 2021-02-15 | Discharge: 2021-02-15 | Disposition: A | Discharge status: Home / Self Care | Payer: Self-pay | Source: Ambulatory Visit | Attending: Radiation Oncology | Admitting: Radiation Oncology

## 2021-02-15 ENCOUNTER — Telehealth: Payer: Self-pay | Admitting: *Deleted

## 2021-02-15 NOTE — Telephone Encounter (Signed)
Talk to patient while she was here today for another visit.  She states that she is continuing to have headaches and her pain medicine of oxycodone and her long-acting OxyContin is not helping with headaches.  I checked with Dr. Janese Banks to see what we can do.  She says if the oxycodone is not working we will have to come up with another plan and will assess [name] to assess patient by telephone call tomorrow and see if he has anything to help.  Also she did get a visa card that she can use for door-that Webb Silversmith gave her today.  And she already has an appointment with [place] on Friday but we want to try to get some medication before then to help with her headache.  She also states that she cannot sleep at night she thinks it is because of the dexamethasone that she is on as part of treatment with radiation for her brain mets.  She was advised that she could take lorazepam 1 tablet at night to see if that can help her sleep.  Patient already has a minute at home.

## 2021-02-16 ENCOUNTER — Inpatient Hospital Stay (HOSPITAL_BASED_OUTPATIENT_CLINIC_OR_DEPARTMENT_OTHER): Payer: Self-pay | Admitting: Hospice and Palliative Medicine

## 2021-02-16 DIAGNOSIS — G893 Neoplasm related pain (acute) (chronic): Secondary | ICD-10-CM

## 2021-02-16 NOTE — Progress Notes (Signed)
Virtual Visit via Telephone Note  I connected with Sophia [name] on 02/16/21 at  9:30 AM EST by telephone and verified that I am speaking with the correct person using two identifiers.  Location: Patient: Home Provider: Clinic   I discussed the limitations, risks, security and privacy concerns of performing an evaluation and management service by telephone and the availability of in person appointments. I also discussed with the patient that there may be a patient responsible charge related to this service. The patient expressed understanding and agreed to proceed.   History of Present Illness: Sophia [name] is a 40 y.o. 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    MRI of the brain on 02/08/2021 revealed widespread metastatic disease to the brain with mild associated edema and small area of hemorrhage in the right frontal.   Observations/Objective: I spoke with patient by phone.  She has had intermittent headaches but denies other neurological changes.  She says headaches come and go.  She is taking OxyContin twice daily and oxycodone 5 mg as needed.  She has not found the oxycodone IR very effective at the 5 mg dose.  She has not tried taking two tablets.  Additionally, patient is on dexamethasone.  Plan is to begin whole brain radiation next week.  Assessment and Plan: Headaches -in setting of known brain metastases.  She is on dexamethasone with plan to begin whole brain radiation next week.  No significant neurologic changes.  We did discuss pain management in detail today and I recommended the patient liberalize her oxycodone IR to 10mg  (two 5mg  tablets) every 4 hours as needed.  Patient verbalized agreement with this plan.  Follow Up Instructions: Patient to see Dr. Janese Banks later this week   I discussed the assessment and treatment plan with the patient. The patient was provided an  opportunity to ask questions and all were answered. The patient agreed with the plan and demonstrated an understanding of the instructions.   The patient was advised to call back or seek an in-person evaluation if the symptoms worsen or if the condition fails to improve as anticipated.  I provided 5 minutes of non-face-to-face time during this encounter.   Irean Hong, NP

## 2021-02-17 DIAGNOSIS — Z510 Encounter for antineoplastic radiation therapy: Secondary | ICD-10-CM | POA: Insufficient documentation

## 2021-02-17 DIAGNOSIS — C7931 Secondary malignant neoplasm of brain: Secondary | ICD-10-CM | POA: Insufficient documentation

## 2021-02-18 ENCOUNTER — Inpatient Hospital Stay: Payer: Self-pay | Attending: Oncology | Admitting: Oncology

## 2021-02-18 ENCOUNTER — Inpatient Hospital Stay: Payer: Self-pay

## 2021-02-18 ENCOUNTER — Telehealth: Payer: Self-pay

## 2021-02-18 ENCOUNTER — Encounter: Payer: Self-pay | Admitting: Oncology

## 2021-02-18 ENCOUNTER — Other Ambulatory Visit: Payer: Self-pay

## 2021-02-18 ENCOUNTER — Other Ambulatory Visit: Payer: Self-pay | Admitting: *Deleted

## 2021-02-18 VITALS — BP 138/89 | HR 71

## 2021-02-18 VITALS — BP 122/79 | HR 90 | Temp 98.2°F | Resp 16 | Wt 136.4 lb

## 2021-02-18 DIAGNOSIS — Z7189 Other specified counseling: Secondary | ICD-10-CM

## 2021-02-18 DIAGNOSIS — C7931 Secondary malignant neoplasm of brain: Secondary | ICD-10-CM | POA: Insufficient documentation

## 2021-02-18 DIAGNOSIS — F419 Anxiety disorder, unspecified: Secondary | ICD-10-CM | POA: Insufficient documentation

## 2021-02-18 DIAGNOSIS — M8440XA Pathological fracture, unspecified site, initial encounter for fracture: Secondary | ICD-10-CM

## 2021-02-18 DIAGNOSIS — Z808 Family history of malignant neoplasm of other organs or systems: Secondary | ICD-10-CM | POA: Insufficient documentation

## 2021-02-18 DIAGNOSIS — C7951 Secondary malignant neoplasm of bone: Secondary | ICD-10-CM | POA: Insufficient documentation

## 2021-02-18 DIAGNOSIS — C50919 Malignant neoplasm of unspecified site of unspecified female breast: Secondary | ICD-10-CM

## 2021-02-18 DIAGNOSIS — Z5112 Encounter for antineoplastic immunotherapy: Secondary | ICD-10-CM | POA: Insufficient documentation

## 2021-02-18 DIAGNOSIS — Z171 Estrogen receptor negative status [ER-]: Secondary | ICD-10-CM | POA: Insufficient documentation

## 2021-02-18 DIAGNOSIS — C50912 Malignant neoplasm of unspecified site of left female breast: Secondary | ICD-10-CM | POA: Insufficient documentation

## 2021-02-18 DIAGNOSIS — G893 Neoplasm related pain (acute) (chronic): Secondary | ICD-10-CM | POA: Insufficient documentation

## 2021-02-18 MED ORDER — ACETAMINOPHEN 325 MG PO TABS
650.0000 mg | ORAL_TABLET | Freq: Once | ORAL | Status: AC
  Administered 2021-02-18: 650 mg via ORAL
  Filled 2021-02-18: qty 2

## 2021-02-18 MED ORDER — TRASTUZUMAB-ANNS CHEMO 150 MG IV SOLR
6.0000 mg/kg | Freq: Once | INTRAVENOUS | Status: AC
  Administered 2021-02-18: 357 mg via INTRAVENOUS
  Filled 2021-02-18: qty 17

## 2021-02-18 MED ORDER — SODIUM CHLORIDE 0.9 % IV SOLN
420.0000 mg | Freq: Once | INTRAVENOUS | Status: AC
  Administered 2021-02-18: 420 mg via INTRAVENOUS
  Filled 2021-02-18: qty 14

## 2021-02-18 MED ORDER — HEPARIN SOD (PORK) LOCK FLUSH 100 UNIT/ML IV SOLN
INTRAVENOUS | Status: AC
  Filled 2021-02-18: qty 5

## 2021-02-18 MED ORDER — HEPARIN SOD (PORK) LOCK FLUSH 100 UNIT/ML IV SOLN
500.0000 [IU] | Freq: Once | INTRAVENOUS | Status: AC | PRN
  Administered 2021-02-18: 500 [IU]
  Filled 2021-02-18: qty 5

## 2021-02-18 MED ORDER — DIPHENHYDRAMINE HCL 50 MG/ML IJ SOLN
50.0000 mg | Freq: Once | INTRAMUSCULAR | Status: AC
  Administered 2021-02-18: 50 mg via INTRAVENOUS
  Filled 2021-02-18: qty 1

## 2021-02-18 MED ORDER — SODIUM CHLORIDE 0.9 % IV SOLN
Freq: Once | INTRAVENOUS | Status: AC
Start: 2021-02-18 — End: 2021-02-18
  Filled 2021-02-18: qty 250

## 2021-02-18 MED ORDER — LORAZEPAM 0.5 MG PO TABS: 0.5000 mg | ORAL_TABLET | Freq: Three times a day (TID) | ORAL | 0 refills | Status: DC

## 2021-02-18 NOTE — Progress Notes (Signed)
Hematology/Oncology Consult note [facility]  [phone] [phone]  Patient Care Team: Sindy Guadeloupe, MD as PCP - General (Oncology) Rico Junker, RN as Registered Nurse Theodore Demark, RN as Registered Nurse Sindy Guadeloupe, MD as Consulting Physician (Hematology and Oncology)   Name of the patient: Sophia [name]  [id number]  Jan 04, 1981   Date of visit: 02/18/21  Diagnosis- stage IV metastatic breast cancer ER/PR negative HER-2/neu positive with bone metastases   Chief complaint/ Reason for visit-discuss CT scan results and further management  Heme/Onc history: patient is a 40 year old Hispanic female who is here with her friend.  History obtained with the help of an interpreter.Patient self palpated left breast mass which was followed by a diagnostic bilateral mammogram.  Mammogram showed 3.1 x 2.9 x 1.9 cm hypoechoic mass at the 1 o'clock position of the left breast.  For abnormal cortically thickened left axillary lymph nodes measuring up to 5 mm.  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  Patient was also having ongoing back pain and was seen by [facility] orthopedics Dr. Doyle Askew who ordered MRI lumbar spine without contrast which showed possible pathologic fractures of L1 and L4 vertebral bodies with greater than 50% height loss at L1 and abnormal signal involving L2-L3 S1 as well as right iliac bone concerning for metastatic disease.  Patient is a single mother of 3 adult children and is very anxious today.  She reports significant back pain which radiates to her bilateral thighs.  Denies any focal tingling numbness or weakness.  Denies any bowel bladder incontinence.  Pain has been uncontrolled despite taking Tylenol.  No prior history of abnormal breast biopsies.  No family history of breast cancer   PET and MRI  showed 3 areas of pathologic fracture of her spine as well as widespread bony metastatic disease and concern for impending fracture of the right hip.  Given her worsening pain she was asked to come to the ER.  She has been evaluated by Dr. Rudene Christians from orthopedic surgery and underwent kyphoplasty at 3 different levels. T6 L1 and L4 along with radiofrequency ablation.    She also underwent prophylactic fixation of the right hip and not affected the sacral region.     Patient received first dose of Herceptin and Perjeta on 09/04/2019.  Baseline echocardiogram normal.she is currently getting taxol/ herceptin/perjeta   Patient admitted to hospital for acute abdominal pain with CT findings concerning for acute colitis.  Colonoscopy showed diffuse severe inflammation with erythema friability and loss of vascularity and shallow ulcerations in the IC valve, ascending colon and cecum.    Interval history-patient is nervous about her recent MRI brain results and concerned about what her future holds for her.  Right hip/leg pain has been stable overall.  She does report intermittent dizziness.  Has not had any falls  ECOG PS- 1 Pain scale- 2 Opioid associated constipation- no  Review of systems- Review of Systems  Constitutional:  Positive for malaise/fatigue. Negative for chills, fever and weight loss.  HENT:  Negative for congestion, ear discharge and nosebleeds.   Eyes:  Negative for blurred vision.  Respiratory:  Negative for cough, hemoptysis, sputum production, shortness of breath and wheezing.   Cardiovascular:  Negative for chest pain, palpitations, orthopnea and claudication.  Gastrointestinal:  Negative for abdominal pain, blood in stool, constipation,  diarrhea, heartburn, melena, nausea and vomiting.  Genitourinary:  Negative for dysuria, flank pain, frequency, hematuria and urgency.  Musculoskeletal:  Negative for back pain, joint pain and myalgias.  Skin:  Negative for rash.  Neurological:   Negative for dizziness, tingling, focal weakness, seizures, weakness and headaches.  Endo/Heme/Allergies:  Does not bruise/bleed easily.  Psychiatric/Behavioral:  Negative for depression and suicidal ideas. The patient does not have insomnia.      No Known Allergies   Past Medical History:  Diagnosis Date   Anxiety    Breast cancer ([place])    with mets   Cancer ([place])    Colitis    COVID-19 in immunocompromised patient ([place])    Family history of colon cancer    Vertigo      Past Surgical History:  Procedure Laterality Date   BREAST BIOPSY Left 08/14/2019   [place] bx of mass, coil marker, path pending   BREAST BIOPSY Left 08/14/2019   [place] bx of LN, hydromarker, path pending   BREAST BIOPSY Left 08/14/2019   affirm bx of calcs, x marker, path pending   ESOPHAGOGASTRODUODENOSCOPY (EGD) WITH PROPOFOL N/A 10/05/2019   Procedure: ESOPHAGOGASTRODUODENOSCOPY (EGD) WITH PROPOFOL;  Surgeon: Lin Landsman, MD;  Location: [place];  Service: Gastroenterology;  Laterality: N/A;   FLEXIBLE SIGMOIDOSCOPY N/A 10/05/2019   Procedure: FLEXIBLE SIGMOIDOSCOPY;  Surgeon: Lin Landsman, MD;  Location: [facility];  Service: Gastroenterology;  Laterality: N/A;   INTRAMEDULLARY (IM) NAIL INTERTROCHANTERIC Right 09/01/2019   Procedure: INTRAMEDULLARY (IM) NAIL INTERTROCHANTRIC AND RADIOFREQUENCY ABLATION;  Surgeon: Hessie Knows, MD;  Location: [facility];  Service: Orthopedics;  Laterality: Right;   KYPHOPLASTY N/A 08/29/2019   Procedure: KYPHOPLASTY T6, L1,L4 ,  RADIOFREQUENCY ABLATION;  Surgeon: Hessie Knows, MD;  Location: [facility];  Service: Orthopedics;  Laterality: N/A;   KYPHOPLASTY Right 09/01/2019   Procedure: Right Sacral Radiofrequency Ablation and Cement Augmentation, Right sacrum and iliac crest;  Surgeon: Hessie Knows, MD;  Location: [facility];  Service: Orthopedics;  Laterality: Right;   PORTA CATH INSERTION N/A 08/28/2019   Procedure: PORTA CATH INSERTION;  Surgeon: Algernon Huxley, MD;  Location: [place] [facility];  Service: Cardiovascular;  Laterality: N/A;    Social History   Socioeconomic History   Marital status: Single    Spouse name: Not on file   Number of children: Not on file   Years of education: Not on file   Highest education level: Not on file  Occupational History   Not on file  Tobacco Use   Smoking status: Never   Smokeless tobacco: Never  Vaping Use   Vaping Use: Never used  Substance and Sexual Activity   Alcohol use: Not Currently   Drug use: Not Currently   Sexual activity: Not Currently    Birth control/protection: None  Other Topics Concern   Not on file  Social History Narrative   Lives at home with children   Social Determinants of Health   Financial Resource Strain: Not on file  Food Insecurity: Not on file  Transportation Needs: Not on file  Physical Activity: Not on file  Stress: Not on file  Social Connections: Not on file  Intimate Partner Violence: Not on file    Family History  Problem Relation Age of Onset   Colon cancer Maternal Uncle      Current Outpatient Medications:    dexamethasone (DECADRON) 4 MG tablet, Take 1 tablet (4 mg total) by mouth 2 (two) times daily with a meal., Disp:  60 tablet, Rfl: 0   lansoprazole (PREVACID) 30 MG capsule, Take 1 capsule (30 mg total) by mouth daily at 12 noon., Disp: 30 capsule, Rfl: 1   oxycodone (OXY-IR) 5 MG capsule, Take 1-2 capsules (5-10 mg total) by mouth every 4 (four) hours as needed., Disp: 90 capsule, Rfl: 0   oxyCODONE (OXYCONTIN) 10 mg 12 hr tablet, Take 1 tablet (10 mg total) by mouth every 12 (twelve) hours., Disp: 60 tablet, Rfl: 0   albuterol (VENTOLIN HFA) 108 (90 Base) MCG/ACT inhaler, Inhale 2 puffs into the lungs every 6 (six) hours as needed for wheezing or shortness of breath. (Patient not taking: Reported on 11/19/2020), Disp: 8 g, Rfl: 2   calcium-vitamin D (OSCAL WITH D) 500-200 MG-UNIT tablet, Take 1 tablet by mouth 2 (two) times daily.  (Patient not taking: Reported on 01/21/2021), Disp: 30 tablet, Rfl: 0   cyclobenzaprine (FLEXERIL) 5 MG tablet, Take 1 tablet (5 mg total) by mouth 3 (three) times daily as needed for muscle spasms. (Patient not taking: Reported on 10/08/2020), Disp: 30 tablet, Rfl: 0   diphenoxylate-atropine (LOMOTIL) 2.5-0.025 MG tablet, Take 1 tablet by mouth 4 (four) times daily as needed for diarrhea or loose stools. (Patient not taking: Reported on 11/19/2020), Disp: 40 tablet, Rfl: 0   DULoxetine (CYMBALTA) 30 MG capsule, Take 1 capsule (30 mg total) by mouth daily. (Patient not taking: Reported on 12/31/2020), Disp: 30 capsule, Rfl: 3   ergocalciferol (VITAMIN D2) 1.25 MG (50000 UT) capsule, Take 1 capsule (50,000 Units total) by mouth once a week. For 4 weeks. Then 1 capsule once monthly for 4 months (Patient not taking: Reported on 01/21/2021), Disp: 8 capsule, Rfl: 0   gabapentin (NEURONTIN) 600 MG tablet, Take 1 tablet (600 mg total) by mouth 3 (three) times daily. (Patient not taking: Reported on 01/28/2021), Disp: 90 tablet, Rfl: 3   lidocaine-prilocaine (EMLA) cream, Apply 1 application topically as needed. Apply small amount to port site at least 1 hour prior to it being accessed, cover with plastic wrap (Patient not taking: Reported on 01/28/2021), Disp: 30 g, Rfl: 1   LORazepam (ATIVAN) 0.5 MG tablet, Take 1 tablet (0.5 mg total) by mouth every 8 (eight) hours. AS NEEDED FOR NAUSEA, Disp: 30 tablet, Rfl: 0   ondansetron (ZOFRAN) 8 MG tablet, Take 1 tablet (8 mg total) by mouth every 8 (eight) hours as needed for nausea or vomiting. (Patient not taking: Reported on 11/19/2020), Disp: 45 tablet, Rfl: 2   potassium chloride SA (KLOR-CON) 20 MEQ tablet, Take 1 tablet (20 mEq total) by mouth daily for 7 days. (Patient not taking: Reported on 11/19/2020), Disp: 7 tablet, Rfl: 0   potassium chloride SA (KLOR-CON) 20 MEQ tablet, Take 1 tablet (20 mEq total) by mouth once for 1 dose. (Patient not taking: Reported on  02/18/2021), Disp: 1 tablet, Rfl: 0   prochlorperazine (COMPAZINE) 10 MG tablet, Take 1 tablet (10 mg total) by mouth every 6 (six) hours as needed for nausea or vomiting. (Patient not taking: Reported on 02/18/2021), Disp: 12 tablet, Rfl: 0 No current facility-administered medications for this visit.  Facility-Administered Medications Ordered in Other Visits:    0.9 %  sodium chloride infusion, , Intravenous, Continuous, Burns, Jennifer E, NP, Stopped at 06/02/20 1453   heparin lock flush 100 UNIT/ML injection, , , ,    heparin lock flush 100 unit/mL, 500 Units, Intravenous, Once, Sindy Guadeloupe, MD   heparin lock flush 100 unit/mL, 500 Units, Intravenous, Once, Sindy Guadeloupe,  MD   sodium chloride flush (NS) 0.9 % injection 10 mL, 10 mL, Intravenous, Once, Sindy Guadeloupe, MD   sodium chloride flush (NS) 0.9 % injection 10 mL, 10 mL, Intravenous, Once, Sindy Guadeloupe, MD  Physical exam:  Vitals:   02/18/21 1018  BP: 122/79  Pulse: 90  Resp: 16  Temp: 98.2 F (36.8 C)  SpO2: 97%  Weight: 136 lb 6.4 oz (61.9 kg)   Physical Exam Constitutional:      General: She is not in acute distress. Cardiovascular:     Rate and Rhythm: Normal rate and regular rhythm.     Heart sounds: Normal heart sounds.  Pulmonary:     Effort: Pulmonary effort is normal.     Breath sounds: Normal breath sounds.  Abdominal:     General: Bowel sounds are normal.     Palpations: Abdomen is soft.  Skin:    General: Skin is warm and dry.  Neurological:     Mental Status: She is alert and oriented to person, place, and time.     CMP Latest Ref Rng & Units 02/05/2021  Glucose 70 - 99 mg/dL 84  BUN 6 - 20 mg/dL 8  Creatinine 0.44 - 1.00 mg/dL 0.34(L)  Sodium 135 - 145 mmol/L 135  Potassium 3.5 - 5.1 mmol/L 3.3(L)  Chloride 98 - 111 mmol/L 108  CO2 22 - 32 mmol/L 21(L)  Calcium 8.9 - 10.3 mg/dL 7.4(L)  Total Protein 6.5 - 8.1 g/dL 6.8  Total Bilirubin 0.3 - 1.2 mg/dL 0.4  Alkaline Phos 38 - 126 U/L 77   AST 15 - 41 U/L 33  ALT 0 - 44 U/L 35   CBC Latest Ref Rng & Units 02/05/2021  WBC 4.0 - 10.5 K/uL 4.1  Hemoglobin 12.0 - 15.0 g/dL 14.3  Hematocrit 36.0 - 46.0 % 44.0  Platelets 150 - 400 K/uL 266    No images are attached to the encounter.  DG Chest 2 View  Result Date: 02/05/2021 CLINICAL DATA:  Infection, fever EXAM: CHEST - 2 VIEW COMPARISON:  Chest x-ray 06/02/2020 FINDINGS: Right-sided central venous port is stable. Heart size is normal. Mediastinum appears stable and within normal limits. Pulmonary vasculature is normal. No focal consolidation identified. No pleural effusion or pneumothorax. Previous vertebroplasty changes in the thoracic and lumbar spine. IMPRESSION: No acute intrathoracic process identified. Electronically Signed   By: Ofilia Neas M.D.   On: 02/05/2021 15:52   CT Head Wo Contrast  Result Date: 02/05/2021 CLINICAL DATA:  Headache EXAM: CT HEAD WITHOUT CONTRAST TECHNIQUE: Contiguous axial images were obtained from the base of the skull through the vertex without intravenous contrast. COMPARISON:  MRI head 12/10/2019 FINDINGS: Brain: Multiple small areas of vasogenic edema identified involving the bilateral anterior frontal lobes, high frontal lobes and left parietal lobe. No acute intracranial hemorrhage. No herniation or hydrocephalus. Vascular: No hyperdense vessel or unexpected calcification. Skull: Small lucent lesion in the left frontal calvarium consistent with metastasis as seen on previous MRI. No definite new bony lesions identified. Sinuses/Orbits: No acute finding. Other: None. IMPRESSION: 1. Multiple (at least 5) small areas of vasogenic edema identified, likely representing metastases given the history. No significant mass effect or herniation. Recommend follow-up MRI brain with contrast. 2. Stable small lucent lesion in the left frontal calvarium consistent with metastasis as seen on previous MRI. Electronically Signed   By: Ofilia Neas M.D.    On: 02/05/2021 16:00   MR Brain W Wo Contrast  Result Date: 02/09/2021  CLINICAL DATA:  History of metastatic breast cancer. Abnormal CT head EXAM: MRI HEAD WITHOUT AND WITH CONTRAST TECHNIQUE: Multiplanar, multiecho pulse sequences of the brain and surrounding structures were obtained without and with intravenous contrast. CONTRAST:  25m GADAVIST GADOBUTROL 1 MMOL/ML IV SOLN COMPARISON:  CT head 02/05/2021.  MRI head 12/10/2019 FINDINGS: Brain: Multiple enhancing metastatic deposits in the brain. Multiple lesions show mild edema. Small area of hemorrhage in the right frontal lesion. No significant mass-effect or midline shift. 5 mm enhancing lesion left inferior cerebellum. Axial image number 18 5 x 7 mm enhancing left posterior cerebellum.  Axial image 38 3 mm enhancing lesion right cerebellum.  Axial image 39 8 mm lesion right posterior cerebellum.  Axial image 44 5 mm enhancing lesion right lateral cerebellum axial image 45 9 mm enhancing lesion right occipital lobe axial image 71 4 mm lesion right occipital parietal lobe.  Axial image 75 14 mm enhancing lesion with slight hemorrhage in the right mid frontal lobe. Axial image 100 12 mm enhancing lesion right frontal convexity.  Axial image 139 12 mm enhancing lesion left posterior parietal lobe.  Axial image 99 6 mm enhancing lesion left frontal lobe.  Axial image 107 9 mm enhancing lesion left lateral parietal lobe.  Axial image 114 8 mm enhancing lesion left frontal lobe.  Axial image 120 10 mm enhancing lesion left frontal convexity.  Axial image 142 Ventricle size normal.  Negative for acute infarct. Vascular: Normal arterial flow voids Skull and upper cervical spine: Left frontal enhancing lesion has improved in the interval compatible with metastatic disease. T1 hyperintense lesion in the body of C2 is unchanged and may represent hemangioma. Sinuses/Orbits: Mild mucosal edema paranasal sinuses. Negative orbit Other: None IMPRESSION: Widespread  metastatic disease to the brain as above. Mild associated edema with multiple lesions. Small area of hemorrhage right frontal metastasis. Electronically Signed   By: CFranchot GalloM.D.   On: 02/09/2021 11:55   DG FEMUR, MIN 2 VIEWS RIGHT  Result Date: 01/21/2021 CLINICAL DATA:  Pain and numbness in the RIGHT thigh. History of breast cancer. EXAM: RIGHT FEMUR 2 VIEWS COMPARISON:  Comparison made with March of 2022. FINDINGS: Cement augmentation related to prior sacroplasty on the RIGHT involving iliac and sacrum.w Post intramedullary nailing of the RIGHT femur with screw across femoral neck. Distal interlocking screw appears well seated. Heterotopic ossification in the soft tissues. No sign of fracture.  No bony destruction. IMPRESSION: Post intramedullary nailing of the RIGHT femur with screw across the femoral neck. No complicating features. Cement augmentation along the RIGHT iliac posteriorly and within the sacrum. No acute or destructive bone process. Electronically Signed   By: GZetta BillsM.D.   On: 01/21/2021 14:19     Assessment and plan- Patient is a 40y.o. female with ER negative HER2 positive metastatic breast cancer with bone metastases now recently found to have brain metastases here to discuss further management  Patient recently had an MRI brain that was done for worsening headache and she was found to have multiple bowelSubcentimeter to 1.4 cm areas of brain metastases but mild edema.  She is currently on steroids 4 mg twice daily and will be starting radiation early next week.  Radiation oncology to decide steroid taper.  Patient's recent systemic scans did not show any evidence of systemic progression other than a possible new bone lesion in her right distal femur for which she received palliative radiation treatment plan is to therefore continue Herceptin and Perjeta at this  time without changing treatment and she will continue to get this during radiation treatment.  I will plan  to get a quick interval CT chest abdomen pelvis with contrast and bone scan sometime end of January.  If there are new areas of distant metastatic disease I will change her systemic management.  Neoplasm related pain: Continue as needed oxycodone and OxyContin  Patient is also on Cymbalta and Neurontin which she will continue  Anxiety: As needed Ativan represcribed.  She will see be seen by covering NP in 3 weeks to receive Herceptin and Perjeta and if her calcium levels are more than 8.5 she will receive Zometa at that time as well.  I will see her back in 6 weeks    Visit Diagnosis 1. Goals of care, counseling/discussion   2. Encounter for monoclonal antibody treatment for malignancy   3. Brain metastases ([place] [place])   4. Primary cancer of left breast with metastasis to other site ([place])   5. Bone metastases ([place])      Dr. Randa Evens, MD, MPH [facility] [phone] 02/18/2021 3:54 PM

## 2021-02-18 NOTE — Patient Instructions (Signed)
[facility]  Discharge Instructions: °Thank you for choosing [facility] to provide your oncology and hematology care.  °If you have a lab appointment with the Cancer Center, please go directly to the Cancer Center and check in at the registration area. ° °Wear comfortable clothing and clothing appropriate for easy access to any Portacath or PICC line.  ° °We strive to give you quality time with your provider. You may need to reschedule your appointment if you arrive late (15 or more minutes).  Arriving late affects you and other patients whose appointments are after yours.  Also, if you miss three or more appointments without notifying the office, you may be dismissed from the clinic at the provider’s discretion.    °  °For prescription refill requests, have your pharmacy contact our office and allow 72 hours for refills to be completed.   ° °Today you received the following chemotherapy and/or immunotherapy agents     °  °To help prevent nausea and vomiting after your treatment, we encourage you to take your nausea medication as directed. ° °BELOW ARE SYMPTOMS THAT SHOULD BE REPORTED IMMEDIATELY: °*FEVER GREATER THAN 100.4 F (38 °C) OR HIGHER °*CHILLS OR SWEATING °*NAUSEA AND VOMITING THAT IS NOT CONTROLLED WITH YOUR NAUSEA MEDICATION °*UNUSUAL SHORTNESS OF BREATH °*UNUSUAL BRUISING OR BLEEDING °*URINARY PROBLEMS (pain or burning when urinating, or frequent urination) °*BOWEL PROBLEMS (unusual diarrhea, constipation, pain near the anus) °TENDERNESS IN MOUTH AND THROAT WITH OR WITHOUT PRESENCE OF ULCERS (sore throat, sores in mouth, or a toothache) °UNUSUAL RASH, SWELLING OR PAIN  °UNUSUAL VAGINAL DISCHARGE OR ITCHING  ° °Items with * indicate a potential emergency and should be followed up as soon as possible or go to the Emergency Department if any problems should occur. ° °Please show the CHEMOTHERAPY ALERT CARD or IMMUNOTHERAPY ALERT CARD at check-in to the  Emergency Department and triage nurse. ° °Should you have questions after your visit or need to cancel or reschedule your appointment, please contact [facility]  [phone] and follow the prompts.  Office hours are 8:00 a.m. to 4:30 p.m. Monday - Friday. Please note that voicemails left after 4:00 p.m. may not be returned until the following business day.  We are closed weekends and major holidays. You have access to a nurse at all times for urgent questions. Please call the main number to the clinic [phone] and follow the prompts. ° °For any non-urgent questions, you may also contact your provider using MyChart. We now offer e-Visits for anyone 18 and older to request care online for non-urgent symptoms. For details visit [email or web address]. °  °Also download the MyChart app! Go to the app store, search "MyChart", open the app, select [facility], and log in with your MyChart username and password. ° °Due to Covid, a mask is required upon entering the hospital/clinic. If you do not have a mask, one will be given to you upon arrival. For doctor visits, patients may have 1 support person aged 18 or older with them. For treatment visits, patients cannot have anyone with them due to current Covid guidelines and our immunocompromised population.  °

## 2021-02-18 NOTE — Progress Notes (Signed)
Per Dr. Janese Banks, proceed with today's treatment based on labs from 02/05/21.  She will do labs next week and possible x-geva at that time.

## 2021-02-18 NOTE — Progress Notes (Signed)
Pt states that she has been experiencing dizziness this morning as well as headaches takes ibuprofren. Was told to increase her oxycodone dose and seems to help. Also, pt will like to know if she can have lorezapem or trazadone again to be able to sleep better.

## 2021-02-18 NOTE — Progress Notes (Signed)
Using labs from 11/19 for today.  Ok to proceed per MD.  No xgeva today.

## 2021-02-18 NOTE — Telephone Encounter (Signed)
Pt is aware of low calcium and will need to increase her calcium intake in order to receive zometa at next visit per Dr. Janese Banks. Pt is aware and understands.

## 2021-02-21 ENCOUNTER — Ambulatory Visit: Admission: RE | Admit: 2021-02-21 | Payer: Self-pay | Source: Ambulatory Visit

## 2021-02-22 ENCOUNTER — Ambulatory Visit
Admission: RE | Admit: 2021-02-22 | Discharge: 2021-02-22 | Disposition: A | Discharge status: Home / Self Care | Payer: Self-pay | Source: Ambulatory Visit | Attending: Radiation Oncology | Admitting: Radiation Oncology

## 2021-02-23 ENCOUNTER — Ambulatory Visit
Admission: RE | Admit: 2021-02-23 | Discharge: 2021-02-23 | Disposition: A | Discharge status: Home / Self Care | Payer: Self-pay | Source: Ambulatory Visit | Attending: Radiation Oncology | Admitting: Radiation Oncology

## 2021-02-24 ENCOUNTER — Ambulatory Visit
Admission: RE | Admit: 2021-02-24 | Discharge: 2021-02-24 | Disposition: A | Discharge status: Home / Self Care | Payer: Self-pay | Source: Ambulatory Visit | Attending: Radiation Oncology | Admitting: Radiation Oncology

## 2021-02-25 ENCOUNTER — Ambulatory Visit
Admission: RE | Admit: 2021-02-25 | Discharge: 2021-02-25 | Disposition: A | Discharge status: Home / Self Care | Payer: Self-pay | Source: Ambulatory Visit | Attending: Radiation Oncology | Admitting: Radiation Oncology

## 2021-02-25 ENCOUNTER — Other Ambulatory Visit: Payer: Self-pay | Admitting: *Deleted

## 2021-02-25 MED ORDER — OXYCODONE HCL ER 10 MG PO T12A: 10.0000 mg | EXTENDED_RELEASE_TABLET | Freq: Two times a day (BID) | ORAL | 0 refills | Status: DC

## 2021-02-26 ENCOUNTER — Encounter: Payer: Self-pay | Admitting: Oncology

## 2021-02-26 NOTE — Telephone Encounter (Signed)
Pt needed refill of ms contin and I called finnegan on call and he will sig for it and I called pt back and let her knwo it will be filled and she could get it on sat. Before they close at 2 pm. Pt ok with that

## 2021-02-28 ENCOUNTER — Ambulatory Visit
Admission: RE | Admit: 2021-02-28 | Discharge: 2021-02-28 | Disposition: A | Discharge status: Home / Self Care | Payer: Self-pay | Source: Ambulatory Visit | Attending: Radiation Oncology | Admitting: Radiation Oncology

## 2021-02-28 ENCOUNTER — Inpatient Hospital Stay (HOSPITAL_BASED_OUTPATIENT_CLINIC_OR_DEPARTMENT_OTHER): Payer: Self-pay | Admitting: Hospice and Palliative Medicine

## 2021-02-28 DIAGNOSIS — Z515 Encounter for palliative care: Secondary | ICD-10-CM

## 2021-02-28 DIAGNOSIS — F419 Anxiety disorder, unspecified: Secondary | ICD-10-CM

## 2021-02-28 DIAGNOSIS — C7951 Secondary malignant neoplasm of bone: Secondary | ICD-10-CM

## 2021-02-28 DIAGNOSIS — C50919 Malignant neoplasm of unspecified site of unspecified female breast: Secondary | ICD-10-CM

## 2021-02-28 MED ORDER — LORAZEPAM 0.5 MG PO TABS: 0.5000 mg | ORAL_TABLET | Freq: Four times a day (QID) | ORAL | 0 refills | Status: DC | PRN

## 2021-02-28 NOTE — Progress Notes (Signed)
Virtual Visit via Telephone Note  I connected with [name] on 02/28/21 at  2:15 PM EST by telephone and verified that I am speaking with the correct person using two identifiers.  Location: Patient: Home Provider: Clinic   I discussed the limitations, risks, security and privacy concerns of performing an evaluation and management service by telephone and the availability of in person appointments. I also discussed with the patient that there may be a patient responsible charge related to this service. The patient expressed understanding and agreed to proceed.   History of Present Illness: Sophia [name] is a 40 y.o. 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    MRI of the brain on 02/08/2021 revealed widespread metastatic disease to the brain with mild associated edema and small area of hemorrhage in the right frontal.   Observations/Objective: Patient is on daily XRT.  Patient reports that she is feeling better.  She continues to have fatigue but says that her headaches have improved.  She denies other neurologic changes or symptomatic concerns at present.  Patient does endorse periodic anxiety and requested to liberalize her lorazepam to 4 times daily as needed  Assessment and Plan: Stage IV breast cancer metastatic to bone and brain -on current XRT.  It sounds like she is doing better symptomatically.  Anxiety -okay to increase lorazepam to every 6 hours as needed.  We will refill #45  Follow Up Instructions: Follow-up telephone visit 3 weeks   I discussed the assessment and treatment plan with the patient. The patient was provided an opportunity to ask questions and all were answered. The patient agreed with the plan and demonstrated an understanding of the instructions.   The patient was advised to call back or seek an in-person evaluation if the symptoms worsen or if the  condition fails to improve as anticipated.  I provided 7 minutes of non-face-to-face time during this encounter.   Irean Hong, NP

## 2021-03-01 ENCOUNTER — Ambulatory Visit
Admission: RE | Admit: 2021-03-01 | Discharge: 2021-03-01 | Disposition: A | Discharge status: Home / Self Care | Payer: Self-pay | Source: Ambulatory Visit | Attending: Radiation Oncology | Admitting: Radiation Oncology

## 2021-03-02 ENCOUNTER — Ambulatory Visit
Admission: RE | Admit: 2021-03-02 | Discharge: 2021-03-02 | Disposition: A | Discharge status: Home / Self Care | Payer: Self-pay | Source: Ambulatory Visit | Attending: Radiation Oncology | Admitting: Radiation Oncology

## 2021-03-03 ENCOUNTER — Ambulatory Visit
Admission: RE | Admit: 2021-03-03 | Discharge: 2021-03-03 | Disposition: A | Discharge status: Home / Self Care | Payer: Self-pay | Source: Ambulatory Visit | Attending: Radiation Oncology | Admitting: Radiation Oncology

## 2021-03-03 ENCOUNTER — Other Ambulatory Visit: Payer: Self-pay | Admitting: *Deleted

## 2021-03-03 DIAGNOSIS — C50919 Malignant neoplasm of unspecified site of unspecified female breast: Secondary | ICD-10-CM

## 2021-03-04 ENCOUNTER — Ambulatory Visit
Admission: RE | Admit: 2021-03-04 | Discharge: 2021-03-04 | Disposition: A | Discharge status: Home / Self Care | Payer: Self-pay | Source: Ambulatory Visit | Attending: Radiation Oncology | Admitting: Radiation Oncology

## 2021-03-07 ENCOUNTER — Ambulatory Visit
Admission: RE | Admit: 2021-03-07 | Discharge: 2021-03-07 | Disposition: A | Discharge status: Home / Self Care | Payer: Self-pay | Source: Ambulatory Visit | Attending: Radiation Oncology | Admitting: Radiation Oncology

## 2021-03-11 ENCOUNTER — Inpatient Hospital Stay: Payer: Self-pay | Admitting: Oncology

## 2021-03-11 ENCOUNTER — Inpatient Hospital Stay: Payer: Self-pay

## 2021-03-14 ENCOUNTER — Encounter: Payer: Self-pay | Admitting: Emergency Medicine

## 2021-03-14 ENCOUNTER — Other Ambulatory Visit: Payer: Self-pay

## 2021-03-14 ENCOUNTER — Inpatient Hospital Stay
Admission: EM | Admit: 2021-03-14 | Discharge: 2021-03-16 | DRG: 534 | Disposition: A | Discharge status: Home Health | Payer: Self-pay | Attending: Internal Medicine | Admitting: Internal Medicine

## 2021-03-14 ENCOUNTER — Observation Stay: Payer: Self-pay

## 2021-03-14 ENCOUNTER — Encounter: Payer: Self-pay | Admitting: Internal Medicine

## 2021-03-14 ENCOUNTER — Emergency Department: Payer: Self-pay

## 2021-03-14 DIAGNOSIS — Z96649 Presence of unspecified artificial hip joint: Secondary | ICD-10-CM

## 2021-03-14 DIAGNOSIS — S72301A Unspecified fracture of shaft of right femur, initial encounter for closed fracture: Principal | ICD-10-CM | POA: Diagnosis present

## 2021-03-14 DIAGNOSIS — Y92009 Unspecified place in unspecified non-institutional (private) residence as the place of occurrence of the external cause: Secondary | ICD-10-CM

## 2021-03-14 DIAGNOSIS — E039 Hypothyroidism, unspecified: Secondary | ICD-10-CM | POA: Diagnosis present

## 2021-03-14 DIAGNOSIS — R609 Edema, unspecified: Secondary | ICD-10-CM

## 2021-03-14 DIAGNOSIS — W19XXXA Unspecified fall, initial encounter: Secondary | ICD-10-CM | POA: Diagnosis present

## 2021-03-14 DIAGNOSIS — Z8616 Personal history of COVID-19: Secondary | ICD-10-CM

## 2021-03-14 DIAGNOSIS — Z79899 Other long term (current) drug therapy: Secondary | ICD-10-CM

## 2021-03-14 DIAGNOSIS — Z20822 Contact with and (suspected) exposure to covid-19: Secondary | ICD-10-CM | POA: Diagnosis present

## 2021-03-14 DIAGNOSIS — C7951 Secondary malignant neoplasm of bone: Secondary | ICD-10-CM | POA: Diagnosis present

## 2021-03-14 DIAGNOSIS — D849 Immunodeficiency, unspecified: Secondary | ICD-10-CM | POA: Diagnosis present

## 2021-03-14 DIAGNOSIS — R4182 Altered mental status, unspecified: Secondary | ICD-10-CM | POA: Diagnosis present

## 2021-03-14 DIAGNOSIS — S7291XA Unspecified fracture of right femur, initial encounter for closed fracture: Secondary | ICD-10-CM | POA: Diagnosis present

## 2021-03-14 DIAGNOSIS — M79604 Pain in right leg: Secondary | ICD-10-CM

## 2021-03-14 DIAGNOSIS — C50912 Malignant neoplasm of unspecified site of left female breast: Secondary | ICD-10-CM | POA: Diagnosis present

## 2021-03-14 LAB — COMPREHENSIVE METABOLIC PANEL
ALT: 24 U/L (ref 0–44)
AST: 23 U/L (ref 15–41)
Albumin: 3.6 g/dL (ref 3.5–5.0)
Alkaline Phosphatase: 73 U/L (ref 38–126)
Anion gap: 8 (ref 5–15)
BUN: 16 mg/dL (ref 6–20)
CO2: 24 mmol/L (ref 22–32)
Calcium: 9.1 mg/dL (ref 8.9–10.3)
Chloride: 103 mmol/L (ref 98–111)
Creatinine, Ser: 0.61 mg/dL (ref 0.44–1.00)
GFR, Estimated: >60 mL/min (ref >60)
Glucose, Bld: 108 mg/dL — ABNORMAL HIGH (ref 70–99)
Potassium: 3.6 mmol/L (ref 3.5–5.1)
Sodium: 135 mmol/L (ref 135–145)
Total Bilirubin: 0.7 mg/dL (ref 0.3–1.2)
Total Protein: 7 g/dL (ref 6.5–8.1)

## 2021-03-14 LAB — CBC WITH DIFFERENTIAL/PLATELET
Abs Immature Granulocytes: 0.38 10*3/uL — ABNORMAL HIGH (ref 0.00–0.07)
Basophils Absolute: 0 10*3/uL (ref 0.0–0.1)
Basophils Relative: 0 %
Eosinophils Absolute: 0 10*3/uL (ref 0.0–0.5)
Eosinophils Relative: 0 %
HCT: 39.4 % (ref 36.0–46.0)
Hemoglobin: 13.2 g/dL (ref 12.0–15.0)
Immature Granulocytes: 4 %
Lymphocytes Relative: 7 %
Lymphs Abs: 0.7 10*3/uL (ref 0.7–4.0)
MCH: 29.9 pg (ref 26.0–34.0)
MCHC: 33.5 g/dL (ref 30.0–36.0)
MCV: 89.3 fL (ref 80.0–100.0)
Monocytes Absolute: 0.6 10*3/uL (ref 0.1–1.0)
Monocytes Relative: 6 %
Neutro Abs: 7.2 10*3/uL (ref 1.7–7.7)
Neutrophils Relative %: 83 %
Platelets: 300 10*3/uL (ref 150–400)
RBC: 4.41 MIL/uL (ref 3.87–5.11)
RDW: 15.4 % (ref 11.5–15.5)
WBC: 8.8 10*3/uL (ref 4.0–10.5)
nRBC: 0 % (ref 0.0–0.2)

## 2021-03-14 LAB — RESP PANEL BY RT-PCR (FLU A&B, COVID) ARPGX2
Influenza A by PCR: NEGATIVE
Influenza B by PCR: NEGATIVE
SARS Coronavirus 2 by RT PCR: NEGATIVE

## 2021-03-14 IMAGING — CT CT ABD-PELV W/ CM
2 of 5 series · 15 of 46 positions shown, 17 images · IV contrast (omnipaque)
Comparison: CT dated [DATE].

CLINICAL DATA: Abdominal pain.  Metastatic breast cancer.

EXAM:
CT ABDOMEN AND PELVIS WITH CONTRAST
TECHNIQUE: Multidetector CT imaging of the abdomen and pelvis was performed
using the standard protocol following bolus administration of
intravenous contrast.
CONTRAST:  100mL OMNIPAQUE IOHEXOL 300 MG/ML  SOLN

[Series 2: routine abd/pel with (person_name) · axial · 0.79mm/px · z∈[-427,-27]mm · 12 of 92 slices shown, 14 images]
[im 6/92  soft-tissue]
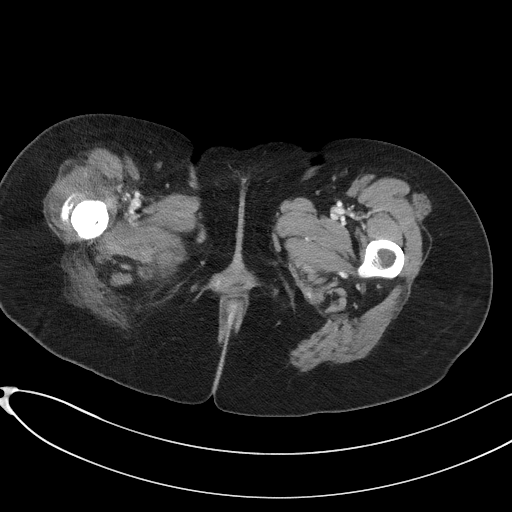
[im 6/92  bone]
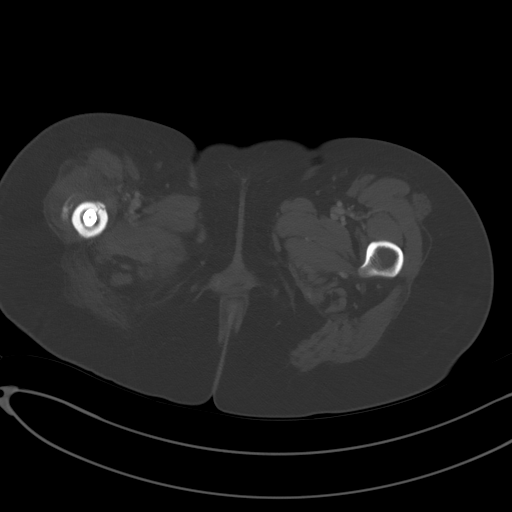
[im 17/92  soft-tissue]
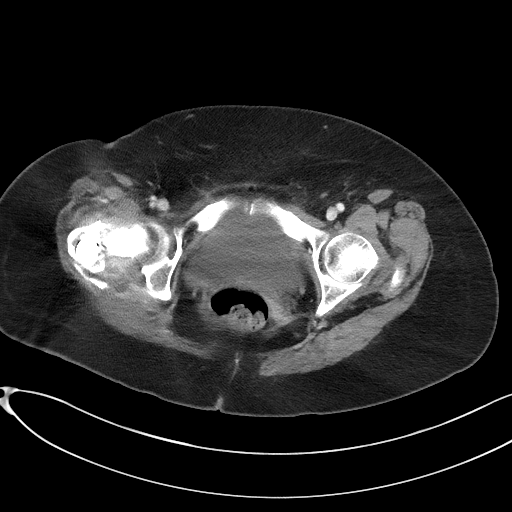
[im 22/92  soft-tissue]
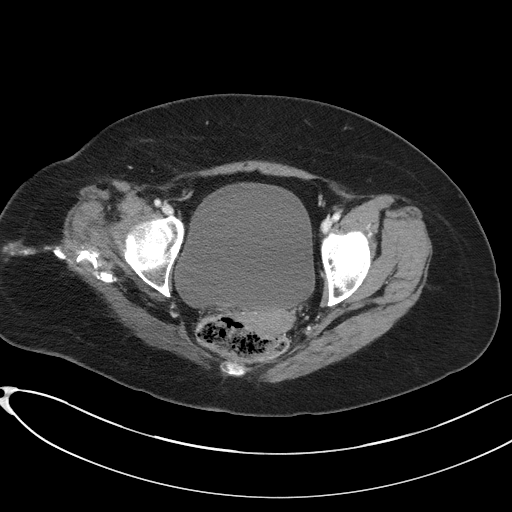
[im 27/92  soft-tissue]
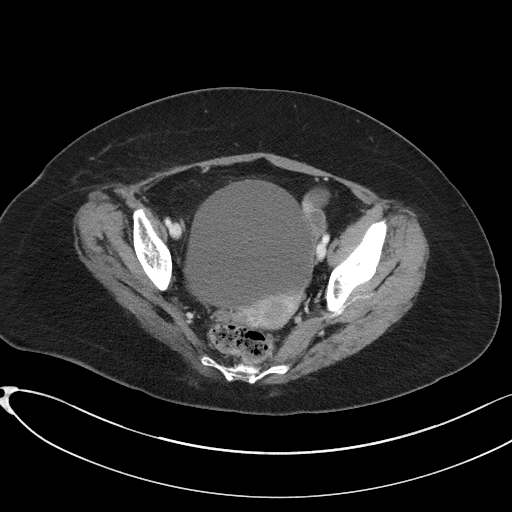
[im 38/92  soft-tissue]
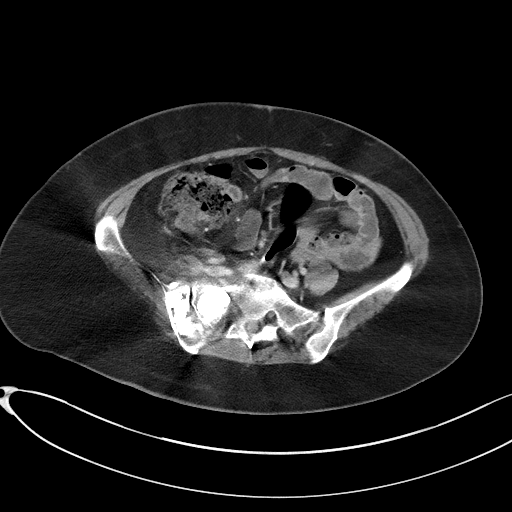
[im 43/92  soft-tissue]
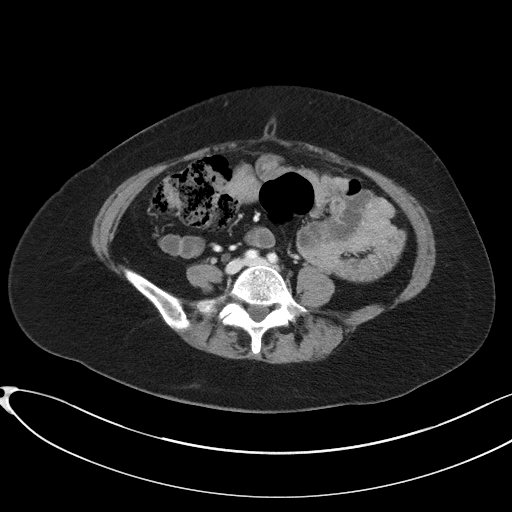
[im 49/92  soft-tissue]
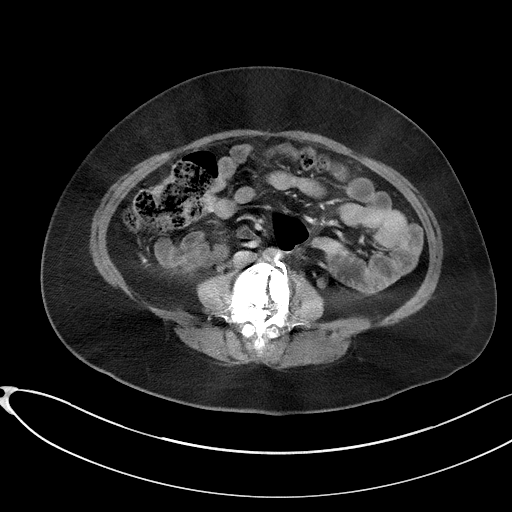
[im 59/92  soft-tissue]
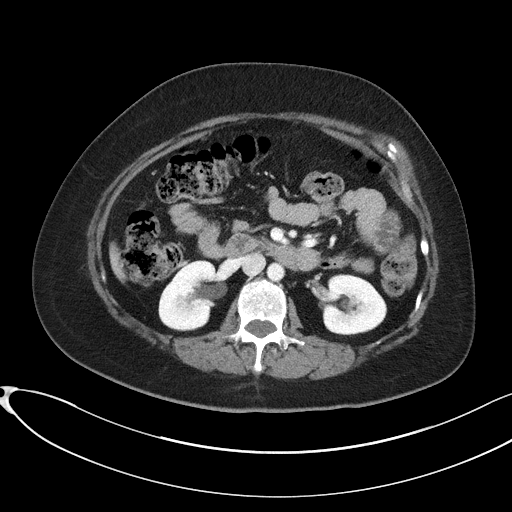
[im 65/92  soft-tissue]
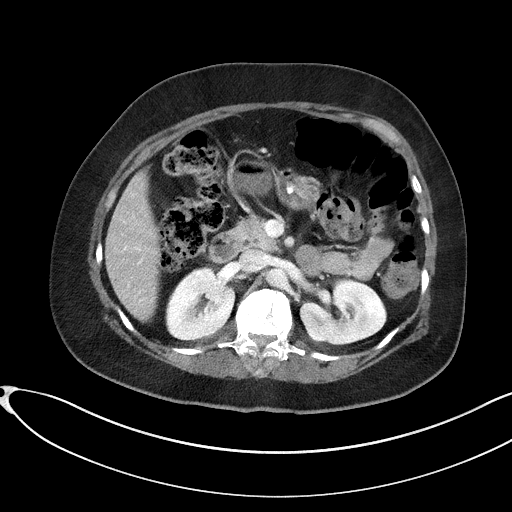
[im 65/92  bone]
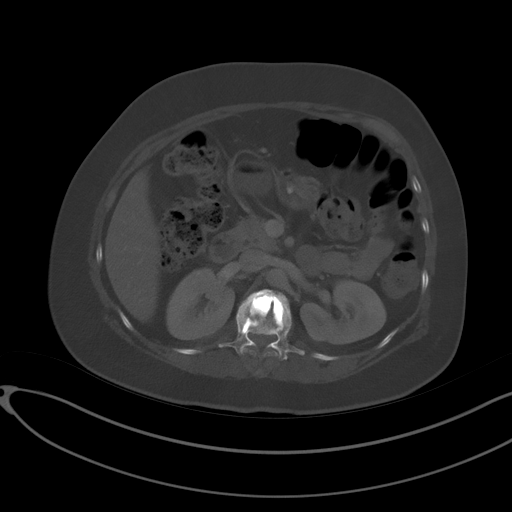
[im 70/92  soft-tissue]
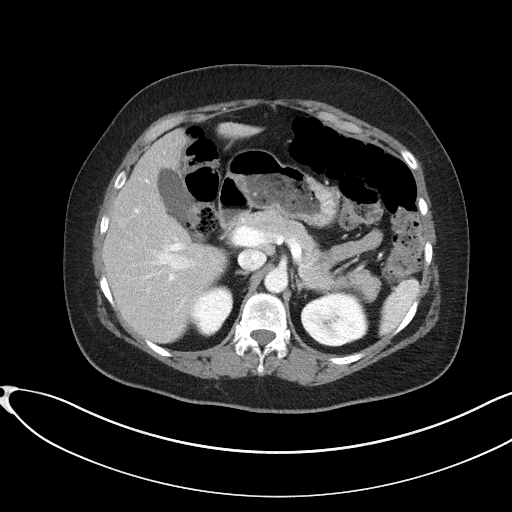
[im 81/92  soft-tissue]
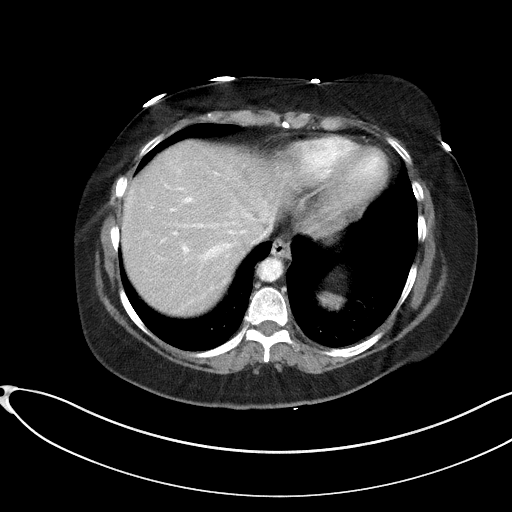
[im 86/92  soft-tissue]
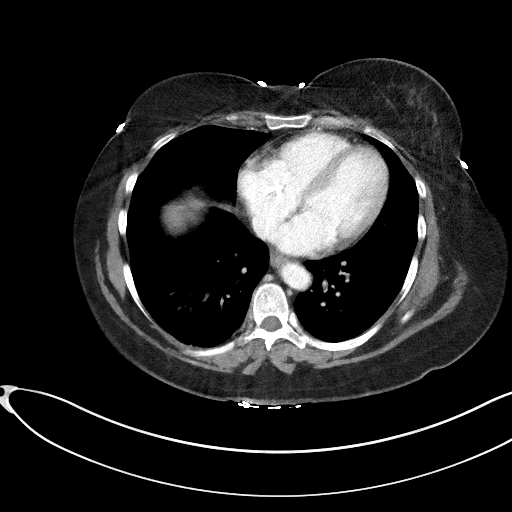

[Series 5: coronal (person_name) · coronal · 0.61mm/px · 3 of 83 slices shown]
[im 28/83  soft-tissue]
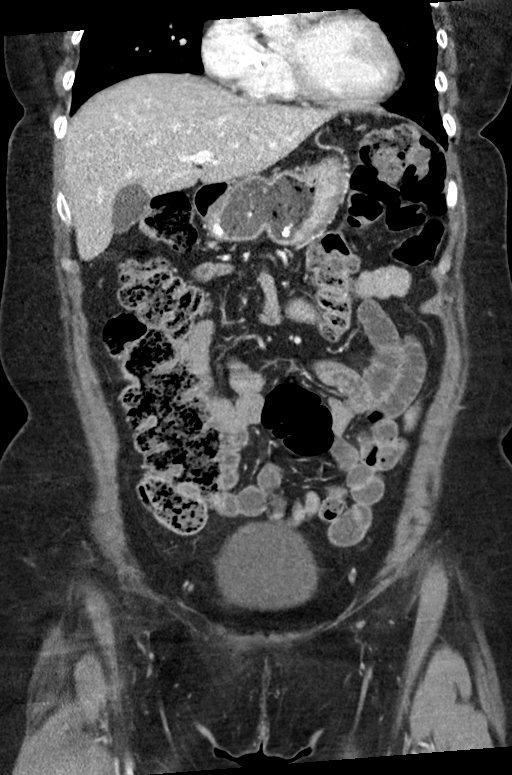
[im 37/83  soft-tissue]
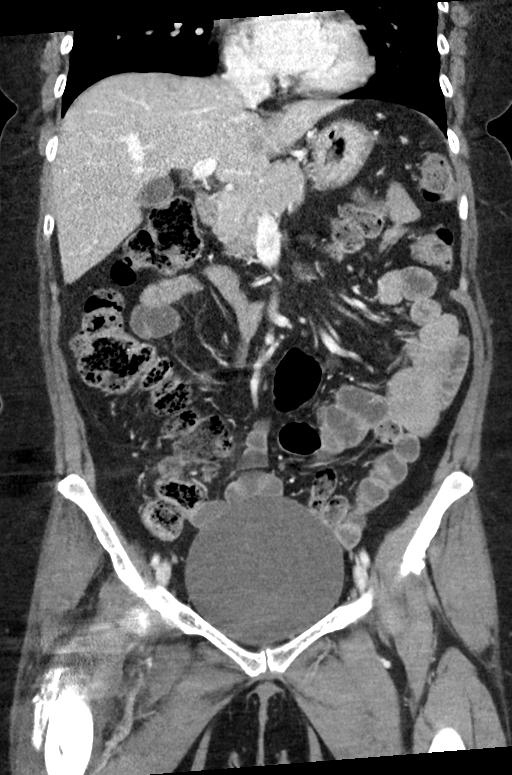
[im 46/83  soft-tissue]
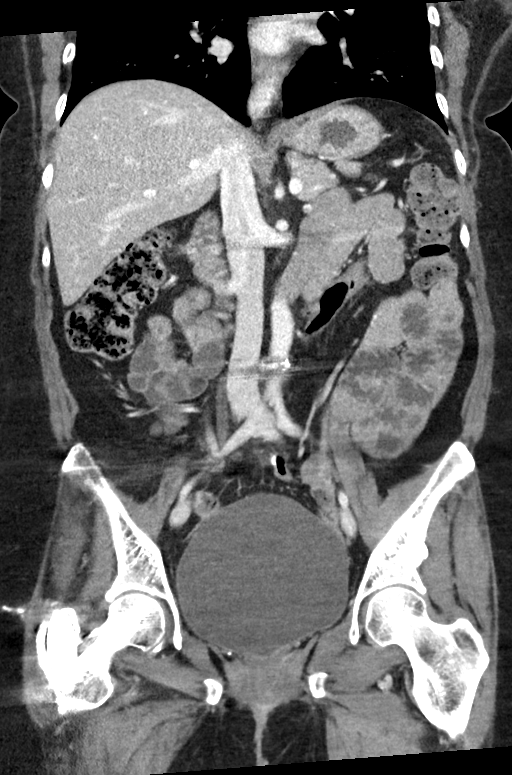

[15 of 46 positions shown; findings below may reference images not displayed]

FINDINGS: Lower chest: An area of atelectasis or scarring in the right lower
lobe medially. The visualized lung bases are otherwise clear.
Partially visualized central venous line in the right atrium.

No intra-abdominal free air or free fluid.

Hepatobiliary: No focal liver abnormality is seen. No gallstones,
gallbladder wall thickening, or biliary dilatation.

Pancreas: Unremarkable. No pancreatic ductal dilatation or
surrounding inflammatory changes.

Spleen: Normal in size without focal abnormality.

Adrenals/Urinary Tract: The adrenal glands unremarkable. The
kidneys, visualized ureters, and urinary bladder appear
unremarkable.

Stomach/Bowel: There is no bowel obstruction or active inflammation.
The appendix is normal.

Vascular/Lymphatic: The abdominal aorta and IVC are unremarkable. No
portal venous gas. There is no adenopathy.

Reproductive: The uterus and ovaries are grossly unremarkable. No
pelvic masses

Other: None

Musculoskeletal: L1 and L4 compression fractures with prior
vertebroplasty changes similar to prior CT. Several scattered
sclerotic lesions in the spine and an area of sclerotic change
involving the superior endplate of L2 similar to prior CT. There is
retropulsion of posterior cortex of L1 with moderate focal narrowing
of the central canal similar to prior CT. Status post prior internal
fixation of right femoral neck fracture. There is vertebroplasty
changes of the right sacral al a and iliac bone. No acute osseous
pathology.
IMPRESSION: 1. No acute intra-abdominal or pelvic pathology. No bowel
obstruction. Normal appendix.
2. Unchanged L1 and L4 compression fractures with prior
vertebroplasty changes.
3. Similar osseous sclerotic metastatic disease.

## 2021-03-14 MED ORDER — ONDANSETRON HCL 4 MG/2ML IJ SOLN
4.0000 mg | Freq: Once | INTRAMUSCULAR | Status: AC
  Administered 2021-03-14: 22:00:00 4 mg via INTRAVENOUS
  Filled 2021-03-14: qty 2

## 2021-03-14 MED ORDER — OXYCODONE HCL 5 MG PO TABS
5.0000 mg | ORAL_TABLET | ORAL | Status: DC | PRN
Start: 2021-03-14 — End: 2021-03-15
  Administered 2021-03-15: 04:00:00 10 mg via ORAL
  Filled 2021-03-14: qty 2

## 2021-03-14 MED ORDER — LORAZEPAM 0.5 MG PO TABS: 0.5000 mg | ORAL_TABLET | Freq: Four times a day (QID) | ORAL | Status: DC | PRN

## 2021-03-14 MED ORDER — DEXAMETHASONE 4 MG PO TABS
4.0000 mg | ORAL_TABLET | Freq: Two times a day (BID) | ORAL | Status: DC
  Administered 2021-03-15 – 2021-03-16 (×3): 4 mg via ORAL
  Filled 2021-03-14 (×6): qty 1

## 2021-03-14 MED ORDER — ALBUTEROL SULFATE (2.5 MG/3ML) 0.083% IN NEBU: 2.5000 mg | INHALATION_SOLUTION | Freq: Four times a day (QID) | RESPIRATORY_TRACT | Status: DC | PRN

## 2021-03-14 MED ORDER — PANTOPRAZOLE SODIUM 40 MG PO TBEC
40.0000 mg | DELAYED_RELEASE_TABLET | Freq: Every day | ORAL | Status: DC
  Administered 2021-03-15 – 2021-03-16 (×2): 40 mg via ORAL
  Filled 2021-03-14 (×2): qty 1

## 2021-03-14 MED ORDER — IOHEXOL 300 MG/ML  SOLN
100.0000 mL | Freq: Once | INTRAMUSCULAR | Status: AC | PRN
  Administered 2021-03-14: 22:00:00 100 mL via INTRAVENOUS
  Filled 2021-03-14: qty 100

## 2021-03-14 MED ORDER — SODIUM CHLORIDE 0.9% FLUSH: 3.0000 mL | INTRAVENOUS | Status: DC | PRN

## 2021-03-14 MED ORDER — SODIUM CHLORIDE 0.9% FLUSH
3.0000 mL | Freq: Two times a day (BID) | INTRAVENOUS | Status: DC
  Administered 2021-03-14 – 2021-03-16 (×4): 3 mL via INTRAVENOUS

## 2021-03-14 MED ORDER — HEPARIN SODIUM (PORCINE) 5000 UNIT/ML IJ SOLN
5000.0000 [IU] | Freq: Three times a day (TID) | INTRAMUSCULAR | Status: DC
  Administered 2021-03-15 – 2021-03-16 (×6): 5000 [IU] via SUBCUTANEOUS
  Filled 2021-03-14 (×6): qty 1

## 2021-03-14 MED ORDER — DULOXETINE HCL 30 MG PO CPEP
30.0000 mg | ORAL_CAPSULE | Freq: Every day | ORAL | Status: DC
  Administered 2021-03-15 – 2021-03-16 (×2): 30 mg via ORAL
  Filled 2021-03-14 (×2): qty 1

## 2021-03-14 MED ORDER — SODIUM CHLORIDE 0.9 % IV SOLN: 250.0000 mL | INTRAVENOUS | Status: DC | PRN

## 2021-03-14 MED ORDER — OXYCODONE HCL ER 10 MG PO T12A
10.0000 mg | EXTENDED_RELEASE_TABLET | Freq: Two times a day (BID) | ORAL | Status: DC
  Administered 2021-03-15 – 2021-03-16 (×3): 10 mg via ORAL
  Filled 2021-03-14 (×4): qty 1

## 2021-03-14 MED ORDER — MORPHINE SULFATE (PF) 4 MG/ML IV SOLN
4.0000 mg | Freq: Once | INTRAVENOUS | Status: AC
  Administered 2021-03-14: 22:00:00 4 mg via INTRAVENOUS
  Filled 2021-03-14: qty 1

## 2021-03-14 NOTE — ED Notes (Signed)
ED TO INPATIENT HANDOFF REPORT  ED Nurse Name and Phone #:   S Name/Age/Gender Sophia [name] 40 y.o. female Room/Bed: [phone]  Code Status   Code Status: Full Code  Home/SNF/Other Home Patient oriented to: self, place, time, and situation Is this baseline? Yes   Triage Complete: Triage complete  Chief Complaint AMS (altered mental status) [R41.82]  Triage Note Pt to ED via POV with c/o falling backwards on Saturday while getting something out of the car. She is not able to ambulate or bear weight. She thought that she would get better but it is getting worse. Pain is not bad because she is taking pai medication. She gets a radiation on that leg, she called the cancer center and they told her to come here to be evaluated.    Allergies No Known Allergies  Level of Care/Admitting Diagnosis ED Disposition     ED Disposition  Admit   Condition  --   Comment  Hospital Area: [facility] [[id number]]  Level of Care: Telemetry Medical [104]  Covid Evaluation: Asymptomatic Screening Protocol (No Symptoms)  Diagnosis: AMS (altered mental status) [[id number]]  Admitting Physician: Cherylann Ratel  Attending Physician: Cherylann Ratel          B Medical/Surgery History Past Medical History:  Diagnosis Date   Anxiety    Breast cancer ([place])    with mets   Cancer ([place])    Colitis    COVID-19 in immunocompromised patient ([place])    Family history of colon cancer    Vertigo    Past Surgical History:  Procedure Laterality Date   BREAST BIOPSY Left 08/14/2019   [place] bx of mass, coil marker, path pending   BREAST BIOPSY Left 08/14/2019   [place] bx of LN, hydromarker, path pending   BREAST BIOPSY Left 08/14/2019   affirm bx of calcs, x marker, path pending   ESOPHAGOGASTRODUODENOSCOPY (EGD) WITH PROPOFOL N/A 10/05/2019   Procedure: ESOPHAGOGASTRODUODENOSCOPY (EGD) WITH PROPOFOL;  Surgeon: Lin Landsman, MD;  Location: [facility];  Service: Gastroenterology;  Laterality: N/A;   FLEXIBLE SIGMOIDOSCOPY N/A 10/05/2019   Procedure: FLEXIBLE SIGMOIDOSCOPY;  Surgeon: Lin Landsman, MD;  Location: [facility] ENDOSCOPY;  Service: Gastroenterology;  Laterality: N/A;   INTRAMEDULLARY (IM) NAIL INTERTROCHANTERIC Right 09/01/2019   Procedure: INTRAMEDULLARY (IM) NAIL INTERTROCHANTRIC AND RADIOFREQUENCY ABLATION;  Surgeon: Hessie Knows, MD;  Location: [facility] ORS;  Service: Orthopedics;  Laterality: Right;   KYPHOPLASTY N/A 08/29/2019   Procedure: KYPHOPLASTY T6, L1,L4 ,  RADIOFREQUENCY ABLATION;  Surgeon: Hessie Knows, MD;  Location: [facility] ORS;  Service: Orthopedics;  Laterality: N/A;   KYPHOPLASTY Right 09/01/2019   Procedure: Right Sacral Radiofrequency Ablation and Cement Augmentation, Right sacrum and iliac crest;  Surgeon: Hessie Knows, MD;  Location: [facility] ORS;  Service: Orthopedics;  Laterality: Right;   PORTA CATH INSERTION N/A 08/28/2019   Procedure: PORTA CATH INSERTION;  Surgeon: Algernon Huxley, MD;  Location: [place] [facility];  Service: Cardiovascular;  Laterality: N/A;     A IV Location/Drains/Wounds Patient Lines/Drains/Airways Status     Active Line/Drains/Airways     Name Placement date Placement time Site Days   Implanted Port 08/28/19 Right Chest 08/28/19  1045  Chest  564   Peripheral IV 03/14/21 20 G Left Antecubital 03/14/21  2148  Antecubital  less than 1   Incision (Closed) 08/29/19 Back Other (Comment) 08/29/19  1311  -- 563   Incision (Closed) 09/01/19 Thigh 09/01/19  1504  -- 560  Incision (Closed) 09/01/19 Back 09/01/19  1551  -- 560            Intake/Output Last 24 hours No intake or output data in the 24 hours ending 03/14/21 2221  Labs/Imaging Results for orders placed or performed during the hospital encounter of 03/14/21 (from the past 48 hour(s))  Comprehensive metabolic panel     Status: Abnormal   Collection Time: 03/14/21  9:49 PM  Result Value Ref Range   Sodium 135 135 -  145 mmol/L   Potassium 3.6 3.5 - 5.1 mmol/L   Chloride 103 98 - 111 mmol/L   CO2 24 22 - 32 mmol/L   Glucose, Bld 108 (H) 70 - 99 mg/dL    Comment: Glucose reference range applies only to samples taken after fasting for at least 8 hours.   BUN 16 6 - 20 mg/dL   Creatinine, Ser 0.61 0.44 - 1.00 mg/dL   Calcium 9.1 8.9 - 10.3 mg/dL   Total Protein 7.0 6.5 - 8.1 g/dL   Albumin 3.6 3.5 - 5.0 g/dL   AST 23 15 - 41 U/L   ALT 24 0 - 44 U/L   Alkaline Phosphatase 73 38 - 126 U/L   Total Bilirubin 0.7 0.3 - 1.2 mg/dL   GFR, Estimated >60 >60 mL/min    Comment: (NOTE) Calculated using the CKD-EPI Creatinine Equation (2021)    Anion gap 8 5 - 15    Comment: Performed at [facility], [address] [place], [place] [address]  CBC with Differential     Status: Abnormal   Collection Time: 03/14/21  9:49 PM  Result Value Ref Range   WBC 8.8 4.0 - 10.5 K/uL   RBC 4.41 3.87 - 5.11 MIL/uL   Hemoglobin 13.2 12.0 - 15.0 g/dL   HCT 39.4 36.0 - 46.0 %   MCV 89.3 80.0 - 100.0 fL   MCH 29.9 26.0 - 34.0 pg   MCHC 33.5 30.0 - 36.0 g/dL   RDW 15.4 11.5 - 15.5 %   Platelets 300 150 - 400 K/uL   nRBC 0.0 0.0 - 0.2 %   Neutrophils Relative % 83 %   Neutro Abs 7.2 1.7 - 7.7 K/uL   Lymphocytes Relative 7 %   Lymphs Abs 0.7 0.7 - 4.0 K/uL   Monocytes Relative 6 %   Monocytes Absolute 0.6 0.1 - 1.0 K/uL   Eosinophils Relative 0 %   Eosinophils Absolute 0.0 0.0 - 0.5 K/uL   Basophils Relative 0 %   Basophils Absolute 0.0 0.0 - 0.1 K/uL   Immature Granulocytes 4 %   Abs Immature Granulocytes 0.38 (H) 0.00 - 0.07 K/uL    Comment: Performed at [facility], [address] [place], [place] [address]   DG Femur Min 2 Views Right  Result Date: 03/14/2021 CLINICAL DATA:  Fall, pain EXAM: RIGHT FEMUR 2 VIEWS COMPARISON:  01/21/2021 FINDINGS: Evidence of prior trauma and internal fixation. There is an acute nondisplaced fracture through the lateral cortex in the proximal to mid shaft of  the right femur. No displacement. No subluxation or dislocation. IMPRESSION: Lateral cortical periprosthetic acute fracture adjacent to the IM nail in the proximal to mid right femur. Electronically Signed   By: Rolm Baptise M.D.   On: 03/14/2021 18:39    Pending Labs Unresulted Labs (From admission, onward)     Start     Ordered   03/15/21 0500  CBC  Tomorrow morning,   STAT  03/14/21 2152   03/14/21 2143  HIV Antibody (routine testing w rflx)  (HIV Antibody (Routine testing w reflex) panel)  Once,   STAT        03/14/21 2152   03/14/21 2055  Resp Panel by RT-PCR (Flu A&B, Covid) Nasopharyngeal Swab  (Tier 2 - Symptomatic/asymptomatic)  Once,   STAT        03/14/21 2055            Vitals/Pain Today's Vitals   03/14/21 1808 03/14/21 1816  BP: 126/88   Pulse: 96   Resp: 18   Temp: 98.4 F (36.9 C)   TempSrc: Oral   SpO2: 95%   Weight:  140 lb (63.5 kg)  Height:  5\' 2"  (1.575 m)  PainSc:  10-Worst pain ever    Isolation Precautions No active isolations  Medications Medications  oxyCODONE (Oxy IR/ROXICODONE) immediate release tablet 5-10 mg (has no administration in time range)  oxyCODONE (OXYCONTIN) 12 hr tablet 10 mg (has no administration in time range)  DULoxetine (CYMBALTA) DR capsule 30 mg (has no administration in time range)  LORazepam (ATIVAN) tablet 0.5 mg (has no administration in time range)  dexamethasone (DECADRON) tablet 4 mg (has no administration in time range)  pantoprazole (PROTONIX) EC tablet 40 mg (has no administration in time range)  albuterol (PROVENTIL) (2.5 MG/3ML) 0.083% nebulizer solution 2.5 mg (has no administration in time range)  heparin injection 5,000 Units (has no administration in time range)  sodium chloride flush (NS) 0.9 % injection 3 mL (has no administration in time range)  sodium chloride flush (NS) 0.9 % injection 3 mL (has no administration in time range)  0.9 %  sodium chloride infusion (has no administration in time  range)  iohexol (OMNIPAQUE) 300 MG/ML solution 100 mL (has no administration in time range)  morphine 4 MG/ML injection 4 mg (4 mg Intravenous Given 03/14/21 2150)  ondansetron (ZOFRAN) injection 4 mg (4 mg Intravenous Given 03/14/21 2151)    Mobility  Low fall risk   Focused Assessments    R Recommendations: See Admitting Provider Note  Report given to:   Additional Notes:  Spanish speaking

## 2021-03-14 NOTE — ED Provider Notes (Signed)
Emergency Medicine Provider Triage Evaluation Note  Sophia [name] , a 40 y.o. female  was evaluated in triage.  Pt complains of right leg pain. She fell while trying to get things out of her car 2 days ago. Since then, she has been unable to bear weight..  Review of Systems  Positive: Right leg pain Negative: Loss of consciousness.   Physical Exam  BP 126/88 (BP Location: Left Arm)    Pulse 96    Temp 98.4 F (36.9 C) (Oral)    Resp 18    SpO2 95%  Gen:   Awake, no distress   Resp:  Normal effort  MSK:   Moves extremities without difficulty  Other:    Medical Decision Making  Medically screening exam initiated at 6:13 PM.  Appropriate orders placed.  Sophia [name] was informed that the remainder of the evaluation will be completed by another provider, this initial triage assessment does not replace that evaluation, and the importance of remaining in the ED until their evaluation is complete.    Victorino Dike, FNP 03/14/21 1817    Harvest Dark, MD 03/14/21 [phone]

## 2021-03-14 NOTE — ED Provider Notes (Signed)
[facility] Emergency Department Provider Note ____________________________________________  Time seen: 2013  I have reviewed the triage vital signs and the nursing notes.  HISTORY  Chief Complaint  Leg Pain   HPI Sophia [name] is a 40 y.o. 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  Patient apparently fell while trying to get some items out of her car 2 days ago.  She landed on her buttocks, and since that time has had increasing pain and disability to the right back.  Patient's history significant for a previous right intertrochanteric nail placement in June 2021.  Patient also had a recent chemoradiation treatment for her bone metastatic disease.  Since the fall, she has had decreased ability to bear weight through her right leg.  She may contact with her on-call oncologist, who recommended she report to the ED for further evaluation given her metastatic bone disease.  Patient notes pain not currently controlled with her daily pain medicines.  She denies any head injury or LOC.  Past Medical History:  Diagnosis Date   Anxiety    Breast cancer ([place])    with mets   Cancer ([place])    Colitis    COVID-19 in immunocompromised patient [facility])    Family history of colon cancer    Vertigo     Patient Active Problem List   Diagnosis Date Noted   C. difficile diarrhea 06/07/2020   E coli infection 06/07/2020   Genetic testing 04/16/2020   Family history of colon cancer    Acute colitis 10/01/2019   Abnormal LFTs 10/01/2019   Bone metastases ([place]) 09/26/2019   Encounter for monoclonal antibody treatment for malignancy    Goals of care, counseling/discussion    Neoplasm related pain    Metastatic breast cancer ([place]) 08/29/2019   Primary cancer of left breast with metastasis to other site ([place]) 08/28/2019   Multiple  pathological fractures 08/28/2019   Anxiety 08/28/2019   Preoperative clearance 08/28/2019   Leukocytosis 08/28/2019    Past Surgical History:  Procedure Laterality Date   BREAST BIOPSY Left 08/14/2019   [place] bx of mass, coil marker, path pending   BREAST BIOPSY Left 08/14/2019   [place] bx of LN, hydromarker, path pending   BREAST BIOPSY Left 08/14/2019   affirm bx of calcs, x marker, path pending   ESOPHAGOGASTRODUODENOSCOPY (EGD) WITH PROPOFOL N/A 10/05/2019   Procedure: ESOPHAGOGASTRODUODENOSCOPY (EGD) WITH PROPOFOL;  Surgeon: Lin Landsman, MD;  Location: [facility];  Service: Gastroenterology;  Laterality: N/A;   FLEXIBLE SIGMOIDOSCOPY N/A 10/05/2019   Procedure: FLEXIBLE SIGMOIDOSCOPY;  Surgeon: Lin Landsman, MD;  Location: [facility] ENDOSCOPY;  Service: Gastroenterology;  Laterality: N/A;   INTRAMEDULLARY (IM) NAIL INTERTROCHANTERIC Right 09/01/2019   Procedure: INTRAMEDULLARY (IM) NAIL INTERTROCHANTRIC AND RADIOFREQUENCY ABLATION;  Surgeon: Hessie Knows, MD;  Location: [facility] ORS;  Service: Orthopedics;  Laterality: Right;   KYPHOPLASTY N/A 08/29/2019   Procedure: KYPHOPLASTY T6, L1,L4 ,  RADIOFREQUENCY ABLATION;  Surgeon: Hessie Knows, MD;  Location: [facility] ORS;  Service: Orthopedics;  Laterality: N/A;   KYPHOPLASTY Right 09/01/2019   Procedure: Right Sacral Radiofrequency Ablation and Cement Augmentation, Right sacrum and iliac crest;  Surgeon: Hessie Knows, MD;  Location: [facility] ORS;  Service: Orthopedics;  Laterality: Right;   PORTA CATH INSERTION N/A 08/28/2019   Procedure: PORTA CATH INSERTION;  Surgeon: Algernon Huxley, MD;  Location: [facility] CV LAB;  Service: Cardiovascular;  Laterality: N/A;    Prior to Admission medications   Medication Sig Start Date End Date Taking? Authorizing Provider  albuterol (VENTOLIN HFA) 108 (90 Base) MCG/ACT inhaler Inhale 2 puffs into the lungs every 6 (six) hours as needed for wheezing or shortness of breath. Patient not taking: Reported  on 11/19/2020 08/13/20   Borders, Kirt Boys, NP  calcium-vitamin D (OSCAL WITH D) 500-200 MG-UNIT tablet Take 1 tablet by mouth 2 (two) times daily. Patient not taking: Reported on 01/21/2021 10/08/20   Borders, Kirt Boys, NP  cyclobenzaprine (FLEXERIL) 5 MG tablet Take 1 tablet (5 mg total) by mouth 3 (three) times daily as needed for muscle spasms. Patient not taking: Reported on 10/08/2020 09/03/20   Borders, Kirt Boys, NP  dexamethasone (DECADRON) 4 MG tablet Take 1 tablet (4 mg total) by mouth 2 (two) times daily with a meal. 02/14/21   Noreene Filbert, MD  diphenoxylate-atropine (LOMOTIL) 2.5-0.025 MG tablet Take 1 tablet by mouth 4 (four) times daily as needed for diarrhea or loose stools. Patient not taking: Reported on 11/19/2020 08/13/20   Borders, Kirt Boys, NP  DULoxetine (CYMBALTA) 30 MG capsule Take 1 capsule (30 mg total) by mouth daily. Patient not taking: Reported on 12/31/2020 12/23/20   Borders, Kirt Boys, NP  ergocalciferol (VITAMIN D2) 1.25 MG (50000 UT) capsule Take 1 capsule (50,000 Units total) by mouth once a week. For 4 weeks. Then 1 capsule once monthly for 4 months Patient not taking: Reported on 01/21/2021 07/12/20   Sindy Guadeloupe, MD  gabapentin (NEURONTIN) 600 MG tablet Take 1 tablet (600 mg total) by mouth 3 (three) times daily. Patient not taking: Reported on 01/28/2021 12/17/20   Sindy Guadeloupe, MD  lansoprazole (PREVACID) 30 MG capsule Take 1 capsule (30 mg total) by mouth daily at 12 noon. 02/14/21   Noreene Filbert, MD  lidocaine-prilocaine (EMLA) cream Apply 1 application topically as needed. Apply small amount to port site at least 1 hour prior to it being accessed, cover with plastic wrap Patient not taking: Reported on 01/28/2021 02/20/20   Sindy Guadeloupe, MD  LORazepam (ATIVAN) 0.5 MG tablet Take 1 tablet (0.5 mg total) by mouth every 6 (six) hours as needed for anxiety. AS NEEDED FOR NAUSEA 02/28/21   Borders, Kirt Boys, NP  ondansetron (ZOFRAN) 8 MG tablet Take 1 tablet (8  mg total) by mouth every 8 (eight) hours as needed for nausea or vomiting. Patient not taking: Reported on 11/19/2020 07/13/20   Borders, Kirt Boys, NP  oxycodone (OXY-IR) 5 MG capsule Take 1-2 capsules (5-10 mg total) by mouth every 4 (four) hours as needed. 01/20/21   Sindy Guadeloupe, MD  oxyCODONE (OXYCONTIN) 10 mg 12 hr tablet Take 1 tablet (10 mg total) by mouth every 12 (twelve) hours. 02/25/21   Lloyd Huger, MD  potassium chloride SA (KLOR-CON) 20 MEQ tablet Take 1 tablet (20 mEq total) by mouth daily for 7 days. Patient not taking: Reported on 11/19/2020 10/08/20 11/19/20  Borders, Kirt Boys, NP  potassium chloride SA (KLOR-CON) 20 MEQ tablet Take 1 tablet (20 mEq total) by mouth once for 1 dose. Patient not taking: Reported on 02/18/2021 01/21/21 01/21/21  Sindy Guadeloupe, MD  prochlorperazine (COMPAZINE) 10 MG tablet Take 1 tablet (10 mg total) by mouth every 6 (six) hours as needed for nausea or vomiting. Patient not taking: Reported on 02/18/2021 02/05/21   Blake Divine, MD    Allergies Patient has no known allergies.  Family  History  Problem Relation Age of Onset   Colon cancer Maternal Uncle     Social History Social History   Tobacco Use   Smoking status: Never   Smokeless tobacco: Never  Vaping Use   Vaping Use: Never used  Substance Use Topics   Alcohol use: Not Currently   Drug use: Not Currently    Review of Systems  Constitutional: Negative for fever. Cardiovascular: Negative for chest pain. Respiratory: Negative for shortness of breath. Gastrointestinal: Negative for abdominal pain, vomiting and diarrhea. Genitourinary: Negative for dysuria. Musculoskeletal: Negative for back pain.  Right thigh pain as above. Skin: Negative for rash. Neurological: Negative for headaches, focal weakness or numbness. ____________________________________________  PHYSICAL EXAM:  VITAL SIGNS: ED Triage Vitals  Enc Vitals Group     BP 03/14/21 1808 126/88     Pulse Rate  03/14/21 1808 96     Resp 03/14/21 1808 18     Temp 03/14/21 1808 98.4 F (36.9 C)     Temp Source 03/14/21 1808 Oral     SpO2 03/14/21 1808 95 %     Weight 03/14/21 1816 140 lb (63.5 kg)     Height 03/14/21 1816 5\' 2"  (1.575 m)     Head Circumference --      Peak Flow --      Pain Score 03/14/21 1816 10     Pain Loc --      Pain Edu? --      Excl. in [place]? --     Constitutional: Alert and oriented. Well appearing and in no distress. Head: Normocephalic and atraumatic. Eyes: Conjunctivae are normal. Normal extraocular movements Ears: Canals clear.  Neck: Supple. No thyromegaly. Cardiovascular: Normal rate, regular rhythm. Normal distal pulses. Respiratory: Normal respiratory effort. No wheezes/rales/rhonchi. Gastrointestinal: Soft and nontender. No distention. Musculoskeletal: Tender to palpation over the right lateral thigh.  No obvious deformity or soft tissue swelling is appreciated.  Nontender with normal range of motion in all extremities.  Neurologic:  Normal gait without ataxia. Normal speech and language. No gross focal neurologic deficits are appreciated. Skin:  Skin is warm, dry and intact. No rash noted. Psychiatric: Mood and affect are normal. Patient exhibits appropriate insight and judgment. ____________________________________________    {LABS (pertinent positives/negatives)  Labs Reviewed  RESP PANEL BY RT-PCR (FLU A&B, COVID) ARPGX2  COMPREHENSIVE METABOLIC PANEL  CBC WITH DIFFERENTIAL/PLATELET  ____________________________________________  {EKG  ____________________________________________   RADIOLOGY Official radiology report(s): DG Femur Min 2 Views Right  Result Date: 03/14/2021 CLINICAL DATA:  Fall, pain EXAM: RIGHT FEMUR 2 VIEWS COMPARISON:  01/21/2021 FINDINGS: Evidence of prior trauma and internal fixation. There is an acute nondisplaced fracture through the lateral cortex in the proximal to mid shaft of the right femur. No displacement. No  subluxation or dislocation. IMPRESSION: Lateral cortical periprosthetic acute fracture adjacent to the IM nail in the proximal to mid right femur. Electronically Signed   By: Rolm Baptise M.D.   On: 03/14/2021 18:39   ____________________________________________  PROCEDURES  Morphine 4 mg IVP Zofran 4 mg IVP  Procedures ____________________________________________   INITIAL IMPRESSION / ASSESSMENT AND PLAN / ED COURSE  As part of my medical decision making, I reviewed the following data within the [place] reviewed as noted, Discussed with admitting physician Girard Cooter. MD, A consult was requested and obtained from this/these consultant(s) Orthopedics, and Notes from prior ED visits  Patient with history of metastatic breast cancer, presenting to the ED with mechanical fall resulting in  a periprosthetic fracture to her right femur intertrochanteric nail.  Patient presents with exquisite pain and disability.  No other injuries related to the fall.  Patient will be admitted to hospital service for pain control and PT evaluation.  Orthopedics will be consulted as is appropriate.  Patient is agreeable to the plan of care at this time.   ----------------------------------------- 8:50 PM on 03/14/2021 ----------------------------------------- S/W Dr. Harlow Mares: I suggested admission for pain management, and he was agreeable. He will review the images and provider a consult note. He notes that she will not need surgical intervention.   ----------------------------------------- 9:16 PM on 03/14/2021 ----------------------------------------- Dr. Girard Cooter: she will admit the patient to her service and consult ortho and PT.   Sophia [name] was evaluated in Emergency Department on 03/14/2021 for the symptoms described in the history of present illness. She was evaluated in the context of the global COVID-19 pandemic, which necessitated consideration that the patient might  be at risk for infection with the SARS-CoV-2 virus that causes COVID-19. Institutional protocols and algorithms that pertain to the evaluation of patients at risk for COVID-19 are in a state of rapid change based on information released by regulatory bodies including the CDC and federal and state organizations. These policies and algorithms were followed during the patient's care in the ED.  ____________________________________________  FINAL CLINICAL IMPRESSION(S) / ED DIAGNOSES  Final diagnoses:  Periprosthetic fracture around internal prosthetic hip joint, initial encounter      Melvenia Needles, PA-C 03/14/21 2138    Carrie Mew, MD 03/14/21 2326

## 2021-03-14 NOTE — H&P (Signed)
History and Physical    Sophia [name] NLZ:[id number] DOB: [birth year] DOA: 03/14/2021  PCP: Sindy Guadeloupe, MD    Patient coming from:  Home    Chief Complaint:  Fall and rt femur fracture.     HPI:  Sophia [name] is a 40 y.o. female seen in ed with complaints of rt leg pain since sat after her fall on saturday, while she was reaching for something in her car. Pain is constant, 10/10, sharp, NR, worse with movements.pt thinks she is sob as she gets agitated. Since yesterday sob. ROS is otherwise negative. Stomach pain since  starting her steroid t/t plan and dose has been cut down.    Pt has past medical history of breast cancer, hypothyroidism on meds. Patient is alert awake and oriented vitals are stable ED Course:  Vitals:   03/14/21 1808 03/14/21 1816  BP: 126/88   Pulse: 96   Resp: 18   Temp: 98.4 F (36.9 C)   TempSrc: Oral   SpO2: 95%   Weight:  63.5 kg  Height:  5\' 2"  (1.575 m)  In the emergency room patient is afebrile normotensive vitals are stable. Lab work in chart  Review of Systems:  Review of Systems  Respiratory:  Positive for shortness of breath.   Musculoskeletal:  Positive for joint pain.  All other systems reviewed and are negative.   Past Medical History:  Diagnosis Date   Anxiety    Breast cancer ([place])    with mets   Cancer ([place])    Colitis    COVID-19 in immunocompromised patient [facility])    Family history of colon cancer    Vertigo     Past Surgical History:  Procedure Laterality Date   BREAST BIOPSY Left 08/14/2019   [place] bx of mass, coil marker, path pending   BREAST BIOPSY Left 08/14/2019   [place] bx of LN, hydromarker, path pending   BREAST BIOPSY Left 08/14/2019   affirm bx of calcs, x marker, path pending   ESOPHAGOGASTRODUODENOSCOPY (EGD) WITH PROPOFOL N/A 10/05/2019   Procedure: ESOPHAGOGASTRODUODENOSCOPY (EGD) WITH PROPOFOL;  Surgeon: Lin Landsman, MD;  Location: [place];  Service: Gastroenterology;   Laterality: N/A;   FLEXIBLE SIGMOIDOSCOPY N/A 10/05/2019   Procedure: FLEXIBLE SIGMOIDOSCOPY;  Surgeon: Lin Landsman, MD;  Location: [facility] ENDOSCOPY;  Service: Gastroenterology;  Laterality: N/A;   INTRAMEDULLARY (IM) NAIL INTERTROCHANTERIC Right 09/01/2019   Procedure: INTRAMEDULLARY (IM) NAIL INTERTROCHANTRIC AND RADIOFREQUENCY ABLATION;  Surgeon: Hessie Knows, MD;  Location: [facility] ORS;  Service: Orthopedics;  Laterality: Right;   KYPHOPLASTY N/A 08/29/2019   Procedure: KYPHOPLASTY T6, L1,L4 ,  RADIOFREQUENCY ABLATION;  Surgeon: Hessie Knows, MD;  Location: [facility] ORS;  Service: Orthopedics;  Laterality: N/A;   KYPHOPLASTY Right 09/01/2019   Procedure: Right Sacral Radiofrequency Ablation and Cement Augmentation, Right sacrum and iliac crest;  Surgeon: Hessie Knows, MD;  Location: [facility] ORS;  Service: Orthopedics;  Laterality: Right;   PORTA CATH INSERTION N/A 08/28/2019   Procedure: PORTA CATH INSERTION;  Surgeon: Algernon Huxley, MD;  Location: [place] CV LAB;  Service: Cardiovascular;  Laterality: N/A;     reports that she has never smoked. She has never used smokeless tobacco. She reports that she does not currently use alcohol. She reports that she does not currently use drugs.  No Known Allergies  Family History  Problem Relation Age of Onset   Colon cancer Maternal Uncle     Prior to Admission medications   Medication Sig Start Date  End Date Taking? Authorizing Provider  albuterol (VENTOLIN HFA) 108 (90 Base) MCG/ACT inhaler Inhale 2 puffs into the lungs every 6 (six) hours as needed for wheezing or shortness of breath. Patient not taking: Reported on 11/19/2020 08/13/20   Borders, Kirt Boys, NP  calcium-vitamin D (OSCAL WITH D) 500-200 MG-UNIT tablet Take 1 tablet by mouth 2 (two) times daily. Patient not taking: Reported on 01/21/2021 10/08/20   Borders, Kirt Boys, NP  cyclobenzaprine (FLEXERIL) 5 MG tablet Take 1 tablet (5 mg total) by mouth 3 (three) times daily as needed for  muscle spasms. Patient not taking: Reported on 10/08/2020 09/03/20   Borders, Kirt Boys, NP  dexamethasone (DECADRON) 4 MG tablet Take 1 tablet (4 mg total) by mouth 2 (two) times daily with a meal. 02/14/21   Noreene Filbert, MD  diphenoxylate-atropine (LOMOTIL) 2.5-0.025 MG tablet Take 1 tablet by mouth 4 (four) times daily as needed for diarrhea or loose stools. Patient not taking: Reported on 11/19/2020 08/13/20   Borders, Kirt Boys, NP  DULoxetine (CYMBALTA) 30 MG capsule Take 1 capsule (30 mg total) by mouth daily. Patient not taking: Reported on 12/31/2020 12/23/20   Borders, Kirt Boys, NP  ergocalciferol (VITAMIN D2) 1.25 MG (50000 UT) capsule Take 1 capsule (50,000 Units total) by mouth once a week. For 4 weeks. Then 1 capsule once monthly for 4 months Patient not taking: Reported on 01/21/2021 07/12/20   Sindy Guadeloupe, MD  gabapentin (NEURONTIN) 600 MG tablet Take 1 tablet (600 mg total) by mouth 3 (three) times daily. Patient not taking: Reported on 01/28/2021 12/17/20   Sindy Guadeloupe, MD  lansoprazole (PREVACID) 30 MG capsule Take 1 capsule (30 mg total) by mouth daily at 12 noon. 02/14/21   Noreene Filbert, MD  lidocaine-prilocaine (EMLA) cream Apply 1 application topically as needed. Apply small amount to port site at least 1 hour prior to it being accessed, cover with plastic wrap Patient not taking: Reported on 01/28/2021 02/20/20   Sindy Guadeloupe, MD  LORazepam (ATIVAN) 0.5 MG tablet Take 1 tablet (0.5 mg total) by mouth every 6 (six) hours as needed for anxiety. AS NEEDED FOR NAUSEA 02/28/21   Borders, Kirt Boys, NP  ondansetron (ZOFRAN) 8 MG tablet Take 1 tablet (8 mg total) by mouth every 8 (eight) hours as needed for nausea or vomiting. Patient not taking: Reported on 11/19/2020 07/13/20   Borders, Kirt Boys, NP  oxycodone (OXY-IR) 5 MG capsule Take 1-2 capsules (5-10 mg total) by mouth every 4 (four) hours as needed. 01/20/21   Sindy Guadeloupe, MD  oxyCODONE (OXYCONTIN) 10 mg 12 hr tablet Take  1 tablet (10 mg total) by mouth every 12 (twelve) hours. 02/25/21   Lloyd Huger, MD  potassium chloride SA (KLOR-CON) 20 MEQ tablet Take 1 tablet (20 mEq total) by mouth daily for 7 days. Patient not taking: Reported on 11/19/2020 10/08/20 11/19/20  Borders, Kirt Boys, NP  potassium chloride SA (KLOR-CON) 20 MEQ tablet Take 1 tablet (20 mEq total) by mouth once for 1 dose. Patient not taking: Reported on 02/18/2021 01/21/21 01/21/21  Sindy Guadeloupe, MD  prochlorperazine (COMPAZINE) 10 MG tablet Take 1 tablet (10 mg total) by mouth every 6 (six) hours as needed for nausea or vomiting. Patient not taking: Reported on 02/18/2021 02/05/21   Blake Divine, MD    Physical Exam: Vitals:   03/14/21 1808 03/14/21 1816  BP: 126/88   Pulse: 96   Resp: 18   Temp: 98.4 F (36.9  C)   TempSrc: Oral   SpO2: 95%   Weight:  63.5 kg  Height:  5\' 2"  (1.575 m)   Physical Exam Vitals and nursing note reviewed.  Constitutional:      Appearance: She is obese.  HENT:     Head: Normocephalic and atraumatic.     Right Ear: External ear normal.     Left Ear: External ear normal.     Nose: Nose normal.     Mouth/Throat:     Mouth: Mucous membranes are moist.  Eyes:     Extraocular Movements: Extraocular movements intact.     Pupils: Pupils are equal, round, and reactive to light.  Cardiovascular:     Rate and Rhythm: Normal rate and regular rhythm.     Pulses: Normal pulses.     Heart sounds: Normal heart sounds.  Pulmonary:     Effort: Pulmonary effort is normal.     Breath sounds: Normal breath sounds.  Abdominal:     General: Bowel sounds are normal. There is no distension.     Palpations: Abdomen is soft. There is no mass.     Tenderness: There is no abdominal tenderness. There is no guarding.     Hernia: No hernia is present.  Musculoskeletal:        General: Tenderness present. No swelling or deformity.     Right lower leg: No edema.     Left lower leg: No edema.  Skin:    General: Skin  is warm.     Coloration: Skin is not jaundiced.  Neurological:     General: No focal deficit present.     Mental Status: She is alert and oriented to person, place, and time.  Psychiatric:        Mood and Affect: Mood normal.        Behavior: Behavior normal.    Labs on Admission: I have personally reviewed following labs and imaging studies  No results for input(s): CKTOTAL, CKMB, TROPONINI in the last 72 hours. Lab Results  Component Value Date   WBC 4.1 02/05/2021   HGB 14.3 02/05/2021   HCT 44.0 02/05/2021   MCV 87.0 02/05/2021   PLT 266 02/05/2021   No results for input(s): NA, K, CL, CO2, BUN, CREATININE, CALCIUM, PROT, BILITOT, ALKPHOS, ALT, AST, GLUCOSE in the last 168 hours.  Invalid input(s): LABALBU No results found for: CHOL, HDL, LDLCALC, TRIG No results found for: DDIMER Invalid input(s): POCBNP   COVID-19 Labs No results for input(s): DDIMER, FERRITIN, LDH, CRP in the last 72 hours. Lab Results  Component Value Date   SARSCOV2NAA NEGATIVE 02/05/2021   [place] NEGATIVE 06/22/2020   [place] NEGATIVE 10/01/2019   [place] NEGATIVE 08/29/2019    Radiological Exams on Admission: DG Femur Min 2 Views Right  Result Date: 03/14/2021 CLINICAL DATA:  Fall, pain EXAM: RIGHT FEMUR 2 VIEWS COMPARISON:  01/21/2021 FINDINGS: Evidence of prior trauma and internal fixation. There is an acute nondisplaced fracture through the lateral cortex in the proximal to mid shaft of the right femur. No displacement. No subluxation or dislocation. IMPRESSION: Lateral cortical periprosthetic acute fracture adjacent to the IM nail in the proximal to mid right femur. Electronically Signed   By: Rolm Baptise M.D.   On: 03/14/2021 18:39    EKG: Independently reviewed.  None    Assessment/Plan: Principal Problem:   Fall at home, initial encounter Active Problems:   Femur fracture, right ([place])   Primary cancer of left breast with metastasis to  other site [facility])   Fall today  with right femur fracture near IM nail placed last year.  Pt unable to bear weight and unable to ambulate. Prn pain meds, Bed rest.  Touchdown weightbearing only per orthopedic recommendations to see patient tomorrow. Orthopedic consult dr. Harlow Mares.    Breast cancer: Left breast cancer being managed by oncology. Pain meds as necessary.    DVT prophylaxis:  Eliquis  Code Status:  Full code  Family Communication:  CANUP,MURIEL (Friend)  [phone] (Mobile)   Disposition Plan:  Home  Consults called:  Dr.Bowers.   Admission status: Observation.     Para Skeans MD [facility] [phone] How to contact the [facility] Attending or Consulting provider [place] or covering provider during after hours [place], for this patient.    Check the care team in [facility] and look for a) attending/consulting [facility] provider listed and b) the [facility] team listed Log into [email or web address] and use [place]'s universal password to access. If you do not have the password, please contact the hospital operator. Locate the [facility] provider you are looking for under [facility] and page to a number that you can be directly reached. If you still have difficulty reaching the provider, please page the [facility] (Director on Call) for the Hospitalists listed on amion for assistance. [email or web address] Password [facility] 03/14/2021, 9:55 PM

## 2021-03-14 NOTE — Progress Notes (Signed)
Received a call from the answering service and I Spoke to patient/friend regarding patients difficulty walking/fall. Given patients widespread metastatic disease recommend patient evaluation emergency room. Patient/friend are in agreement.

## 2021-03-14 NOTE — ED Triage Notes (Signed)
Pt to ED via POV with c/o falling backwards on Saturday while getting something out of the car. She is not able to ambulate or bear weight. She thought that she would get better but it is getting worse. Pain is not bad because she is taking pai medication. She gets a radiation on that leg, she called the cancer center and they told her to come here to be evaluated.

## 2021-03-14 NOTE — H&P (Incomplete)
History and Physical    Sophia [name] HUT:[id number] DOB: December 03, 1980 DOA: 03/14/2021  PCP: Sindy Guadeloupe, MD    Patient coming from:  ***   Chief Complaint:  ***   HPI:  Sophia [name] is a 40 y.o. female seen in ed with complaints of ***     Pt has past medical history of ***     ED Course:  Vitals:   03/14/21 1808 03/14/21 1816  BP: 126/88   Pulse: 96   Resp: 18   Temp: 98.4 F (36.9 C)   TempSrc: Oral   SpO2: 95%   Weight:  63.5 kg  Height:  5\' 2"  (1.575 m)    ***  Review of Systems:  ROS   Past Medical History:  Diagnosis Date   Anxiety    Breast cancer ([place])    with mets   Cancer ([place])    Colitis    COVID-19 in immunocompromised patient ([place])    Family history of colon cancer    Vertigo     Past Surgical History:  Procedure Laterality Date   BREAST BIOPSY Left 08/14/2019   [place] bx of mass, coil marker, path pending   BREAST BIOPSY Left 08/14/2019   [place] bx of LN, hydromarker, path pending   BREAST BIOPSY Left 08/14/2019   affirm bx of calcs, x marker, path pending   ESOPHAGOGASTRODUODENOSCOPY (EGD) WITH PROPOFOL N/A 10/05/2019   Procedure: ESOPHAGOGASTRODUODENOSCOPY (EGD) WITH PROPOFOL;  Surgeon: Lin Landsman, MD;  Location: [facility];  Service: Gastroenterology;  Laterality: N/A;   FLEXIBLE SIGMOIDOSCOPY N/A 10/05/2019   Procedure: FLEXIBLE SIGMOIDOSCOPY;  Surgeon: Lin Landsman, MD;  Location: [facility] ENDOSCOPY;  Service: Gastroenterology;  Laterality: N/A;   INTRAMEDULLARY (IM) NAIL INTERTROCHANTERIC Right 09/01/2019   Procedure: INTRAMEDULLARY (IM) NAIL INTERTROCHANTRIC AND RADIOFREQUENCY ABLATION;  Surgeon: Hessie Knows, MD;  Location: [facility] ORS;  Service: Orthopedics;  Laterality: Right;   KYPHOPLASTY N/A 08/29/2019   Procedure: KYPHOPLASTY T6, L1,L4 ,  RADIOFREQUENCY ABLATION;  Surgeon: Hessie Knows, MD;  Location: [facility] ORS;  Service: Orthopedics;  Laterality: N/A;   KYPHOPLASTY Right  09/01/2019   Procedure: Right Sacral Radiofrequency Ablation and Cement Augmentation, Right sacrum and iliac crest;  Surgeon: Hessie Knows, MD;  Location: [facility] ORS;  Service: Orthopedics;  Laterality: Right;   PORTA CATH INSERTION N/A 08/28/2019   Procedure: PORTA CATH INSERTION;  Surgeon: Algernon Huxley, MD;  Location: [facility];  Service: Cardiovascular;  Laterality: N/A;     reports that she has never smoked. She has never used smokeless tobacco. She reports that she does not currently use alcohol. She reports that she does not currently use drugs.  No Known Allergies  Family History  Problem Relation Age of Onset   Colon cancer Maternal Uncle     Prior to Admission medications   Medication Sig Start Date End Date Taking? Authorizing Provider  albuterol (VENTOLIN HFA) 108 (90 Base) MCG/ACT inhaler Inhale 2 puffs into the lungs every 6 (six) hours as needed for wheezing or shortness of breath. Patient not taking: Reported on 11/19/2020 08/13/20   Borders, Kirt Boys, NP  calcium-vitamin D (OSCAL WITH D) 500-200 MG-UNIT tablet Take 1 tablet by mouth 2 (two) times daily. Patient not taking: Reported on 01/21/2021 10/08/20   Borders, Kirt Boys, NP  cyclobenzaprine (FLEXERIL) 5 MG tablet Take 1 tablet (5 mg total) by mouth 3 (three) times daily as needed for muscle spasms. Patient not taking: Reported on 10/08/2020 09/03/20   Borders, Vonna Kotyk  R, NP  dexamethasone (DECADRON) 4 MG tablet Take 1 tablet (4 mg total) by mouth 2 (two) times daily with a meal. 02/14/21   Chrystal, Eulas Post, MD  diphenoxylate-atropine (LOMOTIL) 2.5-0.025 MG tablet Take 1 tablet by mouth 4 (four) times daily as needed for diarrhea or loose stools. Patient not taking: Reported on 11/19/2020 08/13/20   Borders, Kirt Boys, NP  DULoxetine (CYMBALTA) 30 MG capsule Take 1 capsule (30 mg total) by mouth daily. Patient not taking: Reported on 12/31/2020 12/23/20   Borders, Kirt Boys, NP  ergocalciferol (VITAMIN D2) 1.25 MG (50000 UT)  capsule Take 1 capsule (50,000 Units total) by mouth once a week. For 4 weeks. Then 1 capsule once monthly for 4 months Patient not taking: Reported on 01/21/2021 07/12/20   Sindy Guadeloupe, MD  gabapentin (NEURONTIN) 600 MG tablet Take 1 tablet (600 mg total) by mouth 3 (three) times daily. Patient not taking: Reported on 01/28/2021 12/17/20   Sindy Guadeloupe, MD  lansoprazole (PREVACID) 30 MG capsule Take 1 capsule (30 mg total) by mouth daily at 12 noon. 02/14/21   Noreene Filbert, MD  lidocaine-prilocaine (EMLA) cream Apply 1 application topically as needed. Apply small amount to port site at least 1 hour prior to it being accessed, cover with plastic wrap Patient not taking: Reported on 01/28/2021 02/20/20   Sindy Guadeloupe, MD  LORazepam (ATIVAN) 0.5 MG tablet Take 1 tablet (0.5 mg total) by mouth every 6 (six) hours as needed for anxiety. AS NEEDED FOR NAUSEA 02/28/21   Borders, Kirt Boys, NP  ondansetron (ZOFRAN) 8 MG tablet Take 1 tablet (8 mg total) by mouth every 8 (eight) hours as needed for nausea or vomiting. Patient not taking: Reported on 11/19/2020 07/13/20   Borders, Kirt Boys, NP  oxycodone (OXY-IR) 5 MG capsule Take 1-2 capsules (5-10 mg total) by mouth every 4 (four) hours as needed. 01/20/21   Sindy Guadeloupe, MD  oxyCODONE (OXYCONTIN) 10 mg 12 hr tablet Take 1 tablet (10 mg total) by mouth every 12 (twelve) hours. 02/25/21   Lloyd Huger, MD  potassium chloride SA (KLOR-CON) 20 MEQ tablet Take 1 tablet (20 mEq total) by mouth daily for 7 days. Patient not taking: Reported on 11/19/2020 10/08/20 11/19/20  Borders, Kirt Boys, NP  potassium chloride SA (KLOR-CON) 20 MEQ tablet Take 1 tablet (20 mEq total) by mouth once for 1 dose. Patient not taking: Reported on 02/18/2021 01/21/21 01/21/21  Sindy Guadeloupe, MD  prochlorperazine (COMPAZINE) 10 MG tablet Take 1 tablet (10 mg total) by mouth every 6 (six) hours as needed for nausea or vomiting. Patient not taking: Reported on 02/18/2021 02/05/21    Blake Divine, MD    Physical Exam: Vitals:   03/14/21 1808 03/14/21 1816  BP: 126/88   Pulse: 96   Resp: 18   Temp: 98.4 F (36.9 C)   TempSrc: Oral   SpO2: 95%   Weight:  63.5 kg  Height:  5\' 2"  (1.575 m)   Physical Exam   Labs on Admission: I have personally reviewed following labs and imaging studies  No results for input(s): CKTOTAL, CKMB, TROPONINI in the last 72 hours. Lab Results  Component Value Date   WBC 4.1 02/05/2021   HGB 14.3 02/05/2021   HCT 44.0 02/05/2021   MCV 87.0 02/05/2021   PLT 266 02/05/2021   No results for input(s): NA, K, CL, CO2, BUN, CREATININE, CALCIUM, PROT, BILITOT, ALKPHOS, ALT, AST, GLUCOSE in the last 168 hours.  Invalid  input(s): LABALBU No results found for: CHOL, HDL, LDLCALC, TRIG No results found for: DDIMER Invalid input(s): POCBNP   COVID-19 Labs No results for input(s): DDIMER, FERRITIN, LDH, CRP in the last 72 hours. Lab Results  Component Value Date   SARSCOV2NAA NEGATIVE 02/05/2021   [place] NEGATIVE 06/22/2020   [place] NEGATIVE 10/01/2019   [place] NEGATIVE 08/29/2019    Radiological Exams on Admission: DG Femur Min 2 Views Right  Result Date: 03/14/2021 CLINICAL DATA:  Fall, pain EXAM: RIGHT FEMUR 2 VIEWS COMPARISON:  01/21/2021 FINDINGS: Evidence of prior trauma and internal fixation. There is an acute nondisplaced fracture through the lateral cortex in the proximal to mid shaft of the right femur. No displacement. No subluxation or dislocation. IMPRESSION: Lateral cortical periprosthetic acute fracture adjacent to the IM nail in the proximal to mid right femur. Electronically Signed   By: Rolm Baptise M.D.   On: 03/14/2021 18:39    EKG: Independently reviewed.  ***  echocardiogram: ***    Assessment/Plan: Active Problems:   * No active hospital problems. *      DVT prophylaxis:  ***   Code Status:  ***   Family Communication:  ***   Disposition Plan:  ***   Consults called:   ***  Admission status: ***    Para Skeans MD [facility] [phone] How to contact the [facility] Attending or Consulting provider [place] or covering provider during after hours [place], for this patient.    Check the care team in [facility] and look for a) attending/consulting [facility] provider listed and b) the [facility] team listed Log into [email or web address] and use [place]'s universal password to access. If you do not have the password, please contact the hospital operator. Locate the [facility] provider you are looking for under [facility] and page to a number that you can be directly reached. If you still have difficulty reaching the provider, please page the [facility] (Director on Call) for the Hospitalists listed on amion for assistance. [email or web address] Password [email or web address] 03/14/2021, 9:05 PM

## 2021-03-15 ENCOUNTER — Observation Stay: Payer: Self-pay

## 2021-03-15 DIAGNOSIS — R4182 Altered mental status, unspecified: Secondary | ICD-10-CM | POA: Diagnosis present

## 2021-03-15 LAB — CBC
HCT: 36.4 % (ref 36.0–46.0)
Hemoglobin: 12.2 g/dL (ref 12.0–15.0)
MCH: 29.8 pg (ref 26.0–34.0)
MCHC: 33.5 g/dL (ref 30.0–36.0)
MCV: 88.8 fL (ref 80.0–100.0)
Platelets: 280 10*3/uL (ref 150–400)
RBC: 4.1 MIL/uL (ref 3.87–5.11)
RDW: 15.7 % — ABNORMAL HIGH (ref 11.5–15.5)
WBC: 6.8 10*3/uL (ref 4.0–10.5)
nRBC: 0 % (ref 0.0–0.2)

## 2021-03-15 LAB — HIV ANTIBODY (ROUTINE TESTING W REFLEX): HIV Screen 4th Generation wRfx: NONREACTIVE

## 2021-03-15 IMAGING — US US EXTREM LOW VENOUS
1 series · 13 of 24 positions shown · non-contrast
Comparison: None.



[Series 1: us venous img lower bilat (dvt) · portal-venous · 13 of 110 slices shown]
[im 1/110]
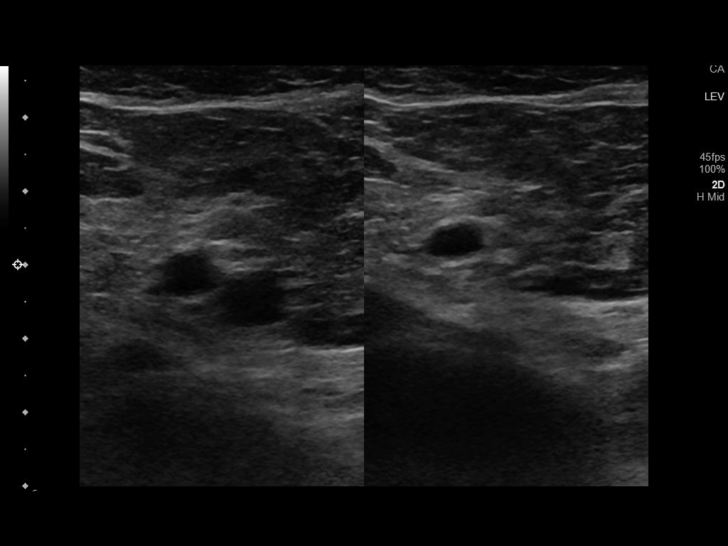
[im 10/110]
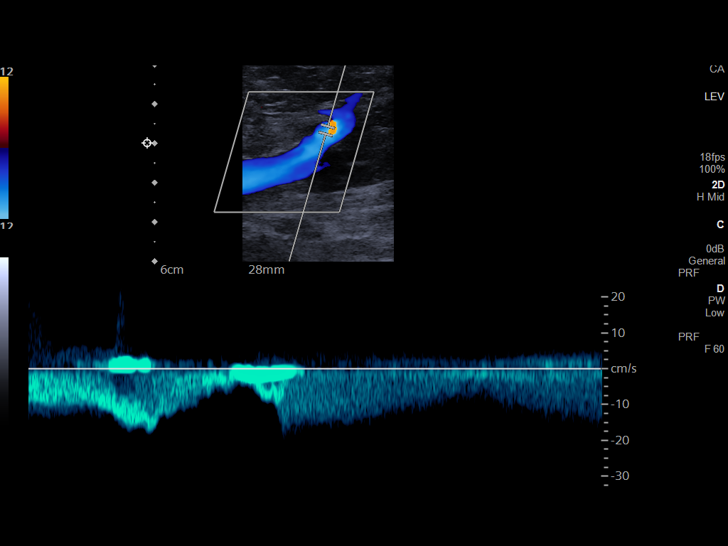
[im 19/110]
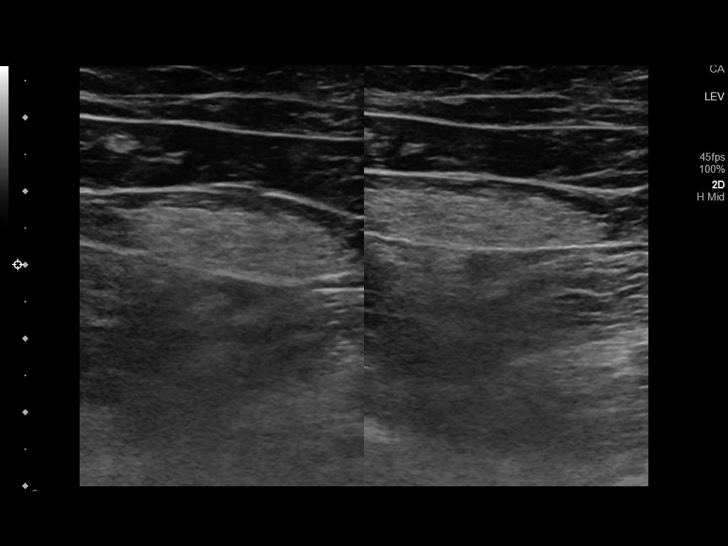
[im 29/110]
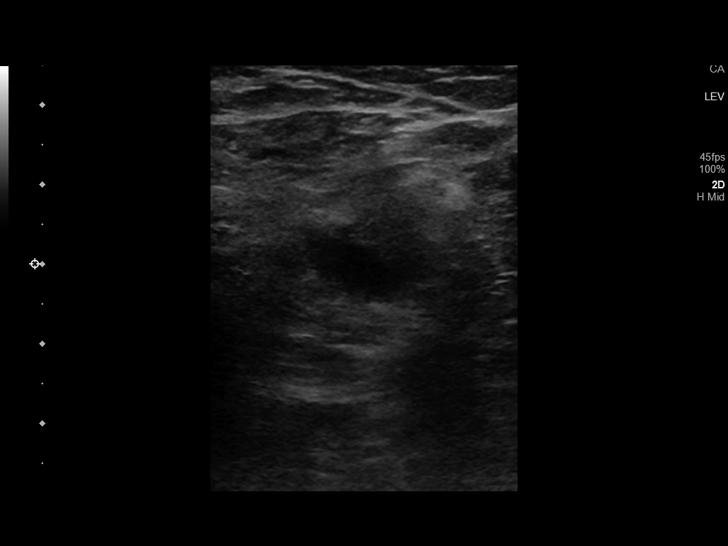
[im 38/110]
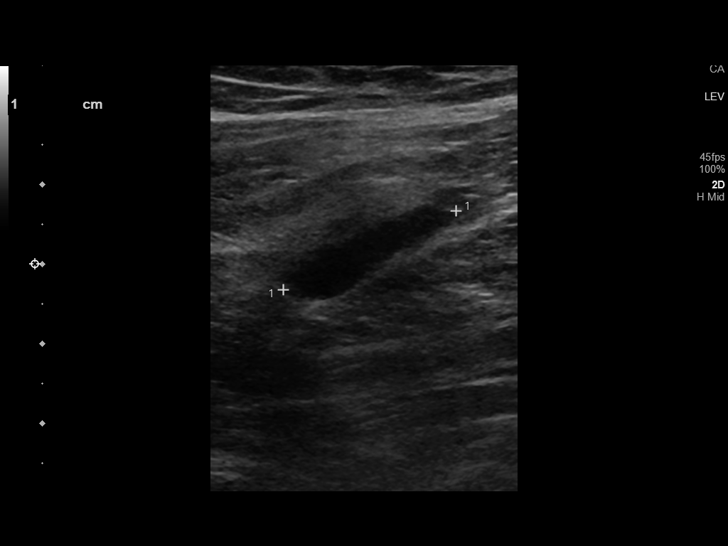
[im 48/110]
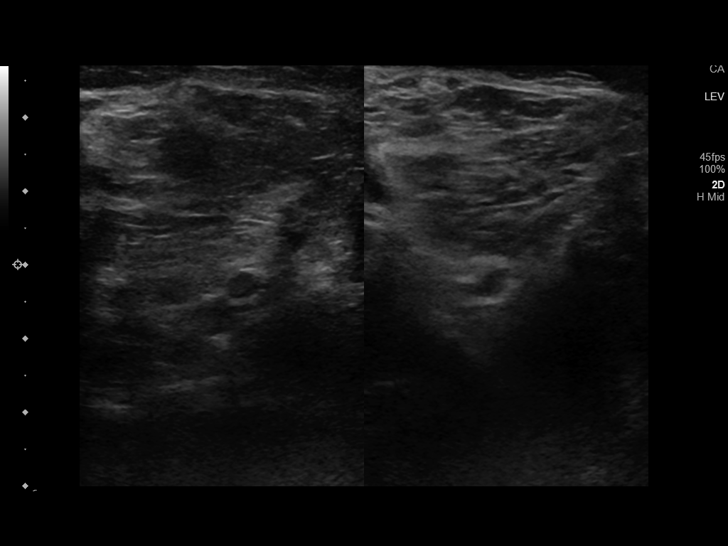
[im 57/110]
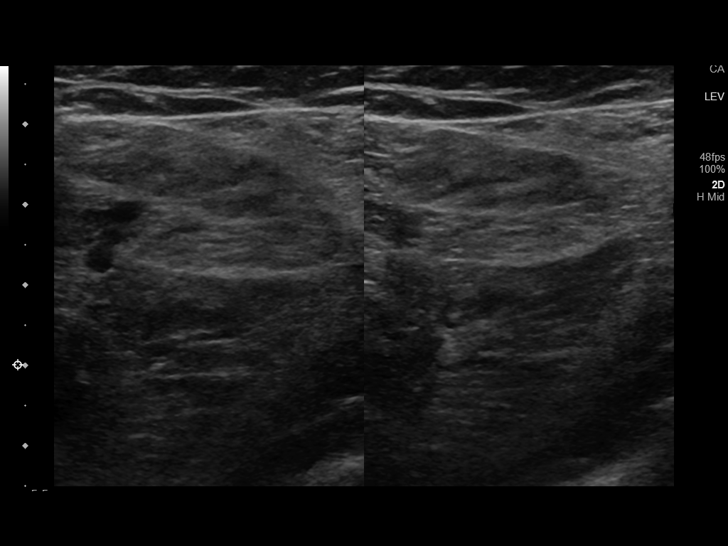
[im 62/110]
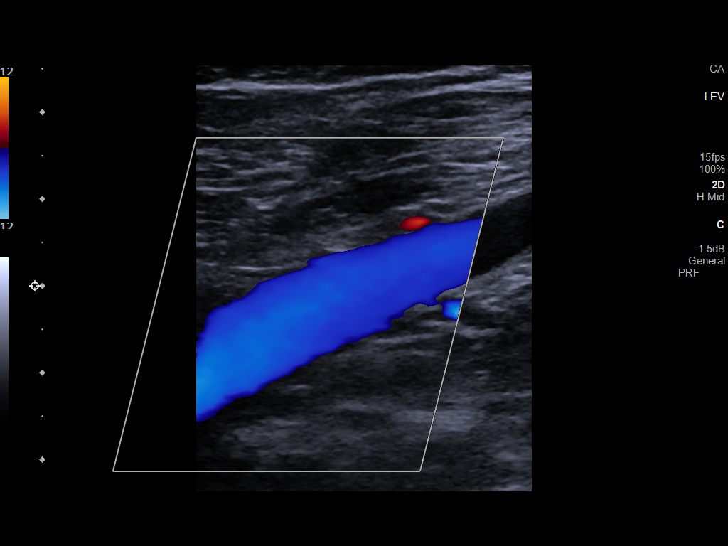
[im 72/110]
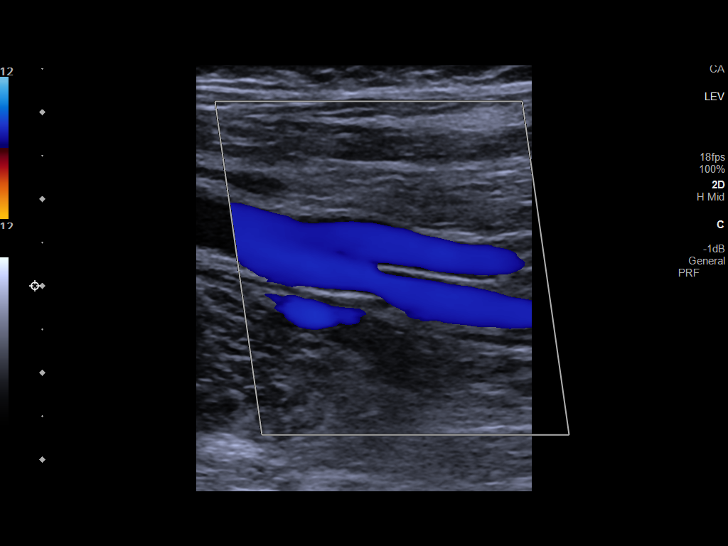
[im 81/110]
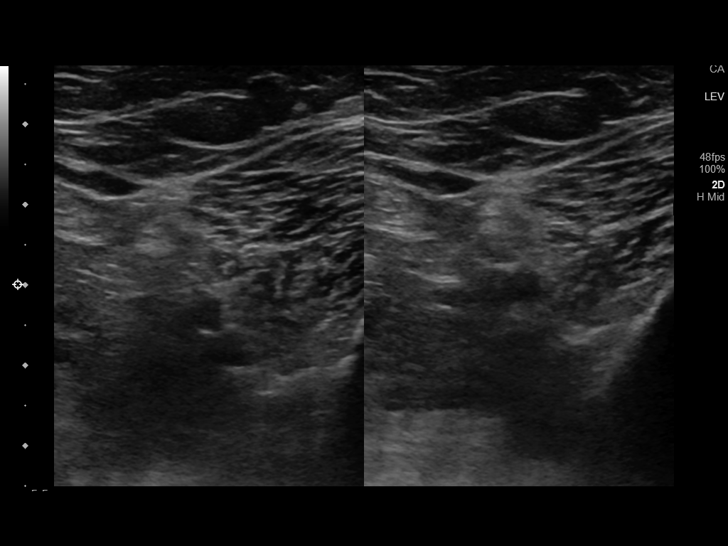
[im 91/110]
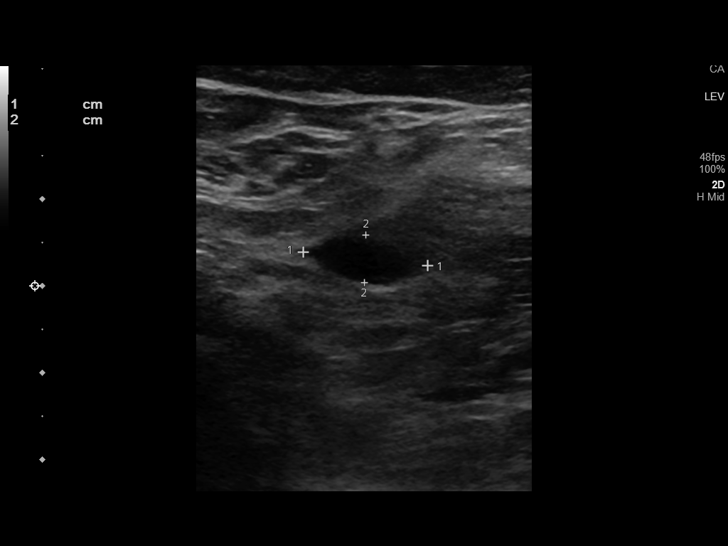
[im 100/110]
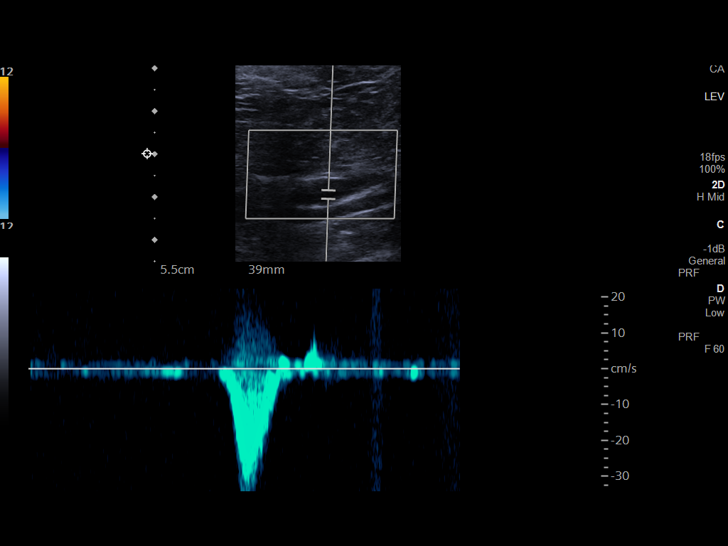
[im 110/110]
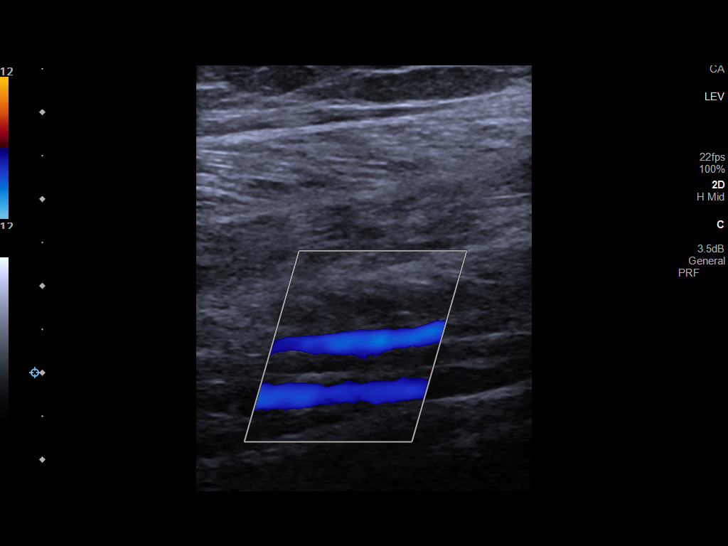

[13 of 24 positions shown; findings below may reference images not displayed]

FINDINGS: RIGHT LOWER EXTREMITY

Common Femoral Vein: No evidence of thrombus. Normal
compressibility, respiratory phasicity and response to augmentation.

Saphenofemoral Junction: No evidence of thrombus. Normal
compressibility and flow on color Doppler imaging.

Profunda Femoral Vein: No evidence of thrombus. Normal
compressibility and flow on color Doppler imaging.

Femoral Vein: No evidence of thrombus. Normal compressibility,
respiratory phasicity and response to augmentation.

Popliteal Vein: No evidence of thrombus. Normal compressibility,
respiratory phasicity and response to augmentation.

Calf Veins: No evidence of thrombus. Normal compressibility and flow
on color Doppler imaging.

LEFT LOWER EXTREMITY

Common Femoral Vein: No evidence of thrombus. Normal
compressibility, respiratory phasicity and response to augmentation.

Saphenofemoral Junction: No evidence of thrombus. Normal
compressibility and flow on color Doppler imaging.

Profunda Femoral Vein: No evidence of thrombus. Normal
compressibility and flow on color Doppler imaging.

Femoral Vein: No evidence of thrombus. Normal compressibility,
respiratory phasicity and response to augmentation.

Popliteal Vein: No evidence of thrombus. Normal compressibility,
respiratory phasicity and response to augmentation.

Calf Veins: No evidence of thrombus. Normal compressibility and flow
on color Doppler imaging.

Other Findings: Bilateral Baker's cysts identified in popliteal
fossas, measuring 2.4 x 0.9 x 1.7 cm on the right, and 2.0 x 0.6 x
1.4 cm on the left.
IMPRESSION: No evidence of deep venous thrombosis in either lower extremity.

## 2021-03-15 MED ORDER — ONDANSETRON HCL 4 MG/2ML IJ SOLN
4.0000 mg | Freq: Three times a day (TID) | INTRAMUSCULAR | Status: DC | PRN
  Administered 2021-03-15: 14:00:00 4 mg via INTRAVENOUS
  Filled 2021-03-15: qty 2

## 2021-03-15 MED ORDER — MORPHINE SULFATE (PF) 2 MG/ML IV SOLN
1.0000 mg | INTRAVENOUS | Status: DC | PRN
  Administered 2021-03-15 – 2021-03-16 (×6): 1 mg via INTRAVENOUS
  Filled 2021-03-15 (×6): qty 1

## 2021-03-15 NOTE — Progress Notes (Addendum)
PROGRESS NOTE    Sophia [name]  DUK:[id number] DOB: 1980-09-23 DOA: 03/14/2021 PCP: Sindy Guadeloupe, MD    Brief Narrative:  Sophia [name] is a 40 y.o. female seen in ed with complaints of rt leg pain since sat after her fall on saturday, while she was reaching for something in her car. Pain is constant, 10/10, sharp, NR, worse with movements.pt thinks she is sob as she gets agitated. Since yesterday sob.      Consultants:  ortho  Procedures:   Antimicrobials:     Subjective: C/o right leg pain. No sob this am. Reports oxyco not helping.   Objective: Vitals:   03/15/21 0214 03/15/21 0300 03/15/21 0400 03/15/21 0500  BP: 120/83 118/69 114/79 124/79  Pulse: 71 80 77 90  Resp:    16  Temp:      TempSrc:      SpO2: 98% 100% 98% 100%  Weight:      Height:        Intake/Output Summary (Last 24 hours) at 03/15/2021 0833 Last data filed at 03/15/2021 0700 Gross per 24 hour  Intake --  Output 100 ml  Net -100 ml   Filed Weights   03/14/21 1816  Weight: 63.5 kg    Examination:  General exam: Appears calm and comfortable  Respiratory system: Clear to auscultation. Respiratory effort normal. Cardiovascular system: S1 & S2 heard, RRR. No JVD, murmurs, rubs, gallops or clicks.  Gastrointestinal system: Abdomen is nondistended, soft and nontender. No organomegaly or masses felt. Normal bowel sounds heard. Central nervous system: Alert and oriented. Grossly intact Extremities: RLE edema. s Psychiatry: Judgement and insight appear normal. Mood & affect appropriate.     Data Reviewed: I have personally reviewed following labs and imaging studies  CBC: Recent Labs  Lab 03/14/21 2149 03/15/21 0616  WBC 8.8 6.8  NEUTROABS 7.2  --   HGB 13.2 12.2  HCT 39.4 36.4  MCV 89.3 88.8  PLT 300 376   Basic Metabolic Panel: Recent Labs  Lab 03/14/21 2149  NA 135  K 3.6  CL 103  CO2 24  GLUCOSE 108*  BUN 16  CREATININE 0.61  CALCIUM 9.1    GFR: Estimated Creatinine Clearance: 80.3 mL/min (by C-G formula based on SCr of 0.61 mg/dL). Liver Function Tests: Recent Labs  Lab 03/14/21 2149  AST 23  ALT 24  ALKPHOS 73  BILITOT 0.7  PROT 7.0  ALBUMIN 3.6   No results for input(s): LIPASE, AMYLASE in the last 168 hours. No results for input(s): AMMONIA in the last 168 hours. Coagulation Profile: No results for input(s): INR, PROTIME in the last 168 hours. Cardiac Enzymes: No results for input(s): CKTOTAL, CKMB, CKMBINDEX, TROPONINI in the last 168 hours. BNP (last 3 results) No results for input(s): PROBNP in the last 8760 hours. HbA1C: No results for input(s): HGBA1C in the last 72 hours. CBG: No results for input(s): GLUCAP in the last 168 hours. Lipid Profile: No results for input(s): CHOL, HDL, LDLCALC, TRIG, CHOLHDL, LDLDIRECT in the last 72 hours. Thyroid Function Tests: No results for input(s): TSH, T4TOTAL, FREET4, T3FREE, THYROIDAB in the last 72 hours. Anemia Panel: No results for input(s): VITAMINB12, FOLATE, FERRITIN, TIBC, IRON, RETICCTPCT in the last 72 hours. Sepsis Labs: No results for input(s): PROCALCITON, LATICACIDVEN in the last 168 hours.  Recent Results (from the past 240 hour(s))  Resp Panel by RT-PCR (Flu A&B, Covid) Nasopharyngeal Swab     Status: None   Collection Time: 03/14/21  9:49 PM   Specimen: Nasopharyngeal Swab; Nasopharyngeal(NP) swabs in vial transport medium  Result Value Ref Range Status   SARS Coronavirus 2 by RT PCR NEGATIVE NEGATIVE Final    Comment: (NOTE) SARS-CoV-2 target nucleic acids are NOT DETECTED.  The SARS-CoV-2 RNA is generally detectable in upper respiratory specimens during the acute phase of infection. The lowest concentration of SARS-CoV-2 viral copies this assay can detect is 138 copies/mL. A negative result does not preclude SARS-Cov-2 infection and should not be used as the sole basis for treatment or other patient management decisions. A negative  result may occur with  improper specimen collection/handling, submission of specimen other than nasopharyngeal swab, presence of viral mutation(s) within the areas targeted by this assay, and inadequate number of viral copies(<138 copies/mL). A negative result must be combined with clinical observations, patient history, and epidemiological information. The expected result is Negative.  Fact Sheet for Patients:  EntrepreneurPulse.com.au  Fact Sheet for Healthcare Providers:  IncredibleEmployment.be  This test is no t yet approved or cleared by the [place] FDA and  has been authorized for detection and/or diagnosis of SARS-CoV-2 by FDA under an Emergency Use Authorization (EUA). This EUA will remain  in effect (meaning this test can be used) for the duration of the COVID-19 declaration under Section 564(b)(1) of the Act, 21 U.S.C.section 360bbb-3(b)(1), unless the authorization is terminated  or revoked sooner.       Influenza A by PCR NEGATIVE NEGATIVE Final   Influenza B by PCR NEGATIVE NEGATIVE Final    Comment: (NOTE) The Xpert Xpress SARS-CoV-2/FLU/RSV plus assay is intended as an aid in the diagnosis of influenza from Nasopharyngeal swab specimens and should not be used as a sole basis for treatment. Nasal washings and aspirates are unacceptable for Xpert Xpress SARS-CoV-2/FLU/RSV testing.  Fact Sheet for Patients: EntrepreneurPulse.com.au  Fact Sheet for Healthcare Providers: IncredibleEmployment.be  This test is not yet approved or cleared by the [place] FDA and has been authorized for detection and/or diagnosis of SARS-CoV-2 by FDA under an Emergency Use Authorization (EUA). This EUA will remain in effect (meaning this test can be used) for the duration of the COVID-19 declaration under Section 564(b)(1) of the Act, 21 U.S.C. section 360bbb-3(b)(1), unless the authorization is  terminated or revoked.  Performed at [facility], [address], [place], [place] [address]          Radiology Studies: CT ABDOMEN PELVIS W CONTRAST  Result Date: 03/14/2021 CLINICAL DATA:  Abdominal pain.  Metastatic breast cancer. EXAM: CT ABDOMEN AND PELVIS WITH CONTRAST TECHNIQUE: Multidetector CT imaging of the abdomen and pelvis was performed using the standard protocol following bolus administration of intravenous contrast. CONTRAST:  140mL OMNIPAQUE IOHEXOL 300 MG/ML  SOLN COMPARISON:  CT dated 01/19/2021. FINDINGS: Lower chest: An area of atelectasis or scarring in the right lower lobe medially. The visualized lung bases are otherwise clear. Partially visualized central venous line in the right atrium. No intra-abdominal free air or free fluid. Hepatobiliary: No focal liver abnormality is seen. No gallstones, gallbladder wall thickening, or biliary dilatation. Pancreas: Unremarkable. No pancreatic ductal dilatation or surrounding inflammatory changes. Spleen: Normal in size without focal abnormality. Adrenals/Urinary Tract: The adrenal glands unremarkable. The kidneys, visualized ureters, and urinary bladder appear unremarkable. Stomach/Bowel: There is no bowel obstruction or active inflammation. The appendix is normal. Vascular/Lymphatic: The abdominal aorta and IVC are unremarkable. No portal venous gas. There is no adenopathy. Reproductive: The uterus and ovaries are grossly unremarkable. No pelvic masses Other:  None Musculoskeletal: L1 and L4 compression fractures with prior vertebroplasty changes similar to prior CT. Several scattered sclerotic lesions in the spine and an area of sclerotic change involving the superior endplate of L2 similar to prior CT. There is retropulsion of posterior cortex of L1 with moderate focal narrowing of the central canal similar to prior CT. Status post prior internal fixation of right femoral neck fracture. There is vertebroplasty changes of  the right sacral al a and iliac bone. No acute osseous pathology. IMPRESSION: 1. No acute intra-abdominal or pelvic pathology. No bowel obstruction. Normal appendix. 2. Unchanged L1 and L4 compression fractures with prior vertebroplasty changes. 3. Similar osseous sclerotic metastatic disease. Electronically Signed   By: Anner Crete M.D.   On: 03/14/2021 22:47   DG Femur Min 2 Views Right  Result Date: 03/14/2021 CLINICAL DATA:  Fall, pain EXAM: RIGHT FEMUR 2 VIEWS COMPARISON:  01/21/2021 FINDINGS: Evidence of prior trauma and internal fixation. There is an acute nondisplaced fracture through the lateral cortex in the proximal to mid shaft of the right femur. No displacement. No subluxation or dislocation. IMPRESSION: Lateral cortical periprosthetic acute fracture adjacent to the IM nail in the proximal to mid right femur. Electronically Signed   By: Rolm Baptise M.D.   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 Iv morphine added for severe pain as she is having lots of pain Venous US of RLE to r/o dvt as increase swelling on exam No surgery at this time.  Touch down wt bearing and f/u with Dr. Rudene Christians on discharge. PT consult       Breast cancer: F/u with oncology oupatient Pain mx     DVT prophylaxis: heparin Code Status:full Family Communication:  Disposition Plan:  Status is: Observation  The patient will require care spanning > 2 midnights and  should be moved to inpatient because: Needing iv treatment , pain mx, and PT consulted           LOS: 0 days   Time spent: 35 min with >50% on coc    Nolberto Hanlon, MD [facility] Pager [phone]  If 7PM-7AM, please contact night-coverage 03/15/2021, 8:33 AM

## 2021-03-15 NOTE — Evaluation (Signed)
Physical Therapy Evaluation Patient Details Name: Sophia [name] MRN: [id number] DOB: 1981/02/07 Today's Date: 03/15/2021  History of Present Illness  40 y/o female here after sudden onset R mid thigh pain while WBing/twisting (?) reaching for something in her car and a few days of inability to bear weight.  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  She had R sacroplasty and prophylactic R hip IM nail 09/01/19 having had multi-level kyphoplasty earlier that week.  She is now found to have R non-displaced femur fracture that is non-operable.  Clinical Impression  Pt very pleasant and eager to work with PT and did surprisingly well with mobility, using L LE to assist R during transitions.  She was able to do some heel-toe shuffling and small hopping steps and generally seemed to manage to maintain very limited Hiko on the R.  Pt with reasonable UE strength, but struggled to show ability to lift well enough to manage steps or comfortably go more than just an inch or 2 at a time during ambulation.  Pt typically has limited assistance at home during the day, though children are home from school at this time for winter break.  Pt has had multiple rounds of radiation to brain and R LE in the past for mets from lung cancer, apparently 1 session in the R thigh in the last week or 2.  Pt motivated and mobile, but will inherently struggle with WBing and sustaining appropriate precautions to promote healing post fracture & despite family & friend support will need equipment to manage in-home.   Patient suffers from R femur fracture which impairs her ability to perform daily activities like toileting, feeding, dressing, grooming, bathing in the home. A cane, walker, or crutch will not resolve the patient's issue with performing activities of daily living. A lightweight wheelchair and cushion is required/recommended and will allow patient to safely perform daily activities.   Patient can  safely propel the wheelchair in the home or has a caregiver who can provide assistance.       Recommendations for follow up therapy are one component of a multi-disciplinary discharge planning process, led by the attending physician.  Recommendations may be updated based on patient status, additional functional criteria and insurance authorization.  Follow Up Recommendations Home health PT    Assistance Recommended at Discharge Frequent or constant Supervision/Assistance  Functional Status Assessment Patient has had a recent decline in their functional status and demonstrates the ability to make significant improvements in function in a reasonable and predictable amount of time.  Equipment Recommendations   (wheelchair, tub bench)    Recommendations for Other Services       Precautions / Restrictions Restrictions Weight Bearing Restrictions: Yes RLE Weight Bearing: Touchdown weight bearing      Mobility  Bed Mobility Overal bed mobility: Modified Independent             General bed mobility comments: Pt able to get herself to EOB using L LE to hook and assist R    Transfers Overall transfer level: Modified independent Equipment used: Rolling walker (2 wheels)               General transfer comment: minimal cuing to insure appropriate set up and sequencing but able to rise w/o assist, maintained TWB on R with minimal cuing    Ambulation/Gait Ambulation/Gait assistance: Min guard Gait Distance (Feet): 8 Feet Assistive device: Rolling walker (2 wheels)  General Gait Details: Pt more comfortable shuffling L foot to move sideways (and forward) but was able to do very small hopping-type steps with heavy UE use.  She did at time, appear to do more than TWB on R, but generally seemed to keep weight off it well.  Stairs Stairs: Yes Stairs assistance: Min assist Stair Management: No rails;Backwards;With walker Number of Stairs: 1 General stair comments: 3  trials of backward negotiation of 2" step.  Pt able to perform but with difficutly and heavy effort.  Wheelchair Mobility    Modified Rankin (Stroke Patients Only)       Balance                                             Pertinent Vitals/Pain Pain Assessment: 0-10 Pain Score: 4  Pain Location: mid thigh    Home Living Family/patient expects to be discharged to:: Private residence Living Arrangements: Children Available Help at Discharge: Family;Friend(s) (oldest child is 93, does drive - has a friend that does help PRN)   Home Access: Stairs to enter Entrance Stairs-Rails: None Entrance Stairs-Number of Steps: 1 (4")     Home Equipment: Conservation officer, nature (2 wheels);BSC/3in1      Prior Function Prior Level of Function : Independent/Modified Independent             Mobility Comments: Pt reports that until 3 days ago she was able to manage the home, cook, clean, run errands, etc       Hand Dominance        Extremity/Trunk Assessment   Upper Extremity Assessment Upper Extremity Assessment: Overall WFL for tasks assessed;Generalized weakness    Lower Extremity Assessment Lower Extremity Assessment: Overall WFL for tasks assessed;Generalized weakness (minimal tolerance to AROM on R, good L LE strength)       Communication   Communication: Prefers language other than English Clinical research associate (interp) present and assisting t/o the session)  Cognition Arousal/Alertness: Awake/alert Behavior During Therapy: WFL for tasks assessed/performed Overall Cognitive Status: Within Functional Limits for tasks assessed                                          General Comments General comments (skin integrity, edema, etc.): Pt showed great effort and motivation but UE strength and R WBing limitations are significant limiters    Exercises     Assessment/Plan    PT Assessment Patient needs continued PT services  PT Problem List Decreased  strength;Decreased range of motion;Decreased activity tolerance;Decreased balance;Decreased mobility;Decreased knowledge of use of DME;Decreased safety awareness;Pain;Decreased knowledge of precautions       PT Treatment Interventions DME instruction;Gait training;Stair training;Functional mobility training;Therapeutic activities;Therapeutic exercise;Balance training;Neuromuscular re-education;Patient/family education    PT Goals (Current goals can be found in the Care Plan section)  Acute Rehab PT Goals Patient Stated Goal: go home PT Goal Formulation: With patient Time For Goal Achievement: 03/29/21 Potential to Achieve Goals: Fair    Frequency 7X/week   Barriers to discharge        Co-evaluation               AM-PAC PT "6 Clicks" Mobility  Outcome Measure Help needed turning from your back to your side while in a flat bed without using bedrails?: None Help needed moving  from lying on your back to sitting on the side of a flat bed without using bedrails?: A Little Help needed moving to and from a bed to a chair (including a wheelchair)?: A Little Help needed standing up from a chair using your arms (e.g., wheelchair or bedside chair)?: A Little Help needed to walk in hospital room?: A Lot Help needed climbing 3-5 steps with a railing? : Total 6 Click Score: 16    End of Session Equipment Utilized During Treatment: Gait belt Activity Tolerance: Patient limited by fatigue;Patient tolerated treatment well;Patient limited by pain Patient left: with chair alarm set;with call bell/phone within reach Nurse Communication: Mobility status PT Visit Diagnosis: Muscle weakness (generalized) (M62.81);Difficulty in walking, not elsewhere classified (R26.2);Pain Pain - Right/Left: Right Pain - part of body: Leg    Time: 1620-1710 PT Time Calculation (min) (ACUTE ONLY): 50 min   Charges:   PT Evaluation $PT Eval Moderate Complexity: 1 Mod PT Treatments $Gait Training: 8-22  mins $Therapeutic Exercise: 8-22 mins        Kreg Shropshire, DPT 03/15/2021, 5:40 PM

## 2021-03-15 NOTE — Consult Note (Signed)
ORTHOPAEDIC CONSULTATION  REQUESTING PHYSICIAN: Nolberto Hanlon, MD  Chief Complaint: right thigh pain  HPI: Sophia [name] is a 40 y.o. female who complains of right thigh pain after fall 3 days ago. The pain is sharp in character. The pain is severe and 8/10. The pain is worse with movement and better with rest. Denies any numbness, tingling or constitutional symptoms. Patient is well known to Dr. Rudene Christians and had right femur prophylactic nailing in July of 2021.  Past Medical History:  Diagnosis Date   Anxiety    Breast cancer (No Name)    with mets   Cancer ([place])    Colitis    COVID-19 in immunocompromised patient [facility])    Family history of colon cancer    Vertigo    Past Surgical History:  Procedure Laterality Date   BREAST BIOPSY Left 08/14/2019   [place] bx of mass, coil marker, path pending   BREAST BIOPSY Left 08/14/2019   [place] bx of LN, hydromarker, path pending   BREAST BIOPSY Left 08/14/2019   affirm bx of calcs, x marker, path pending   ESOPHAGOGASTRODUODENOSCOPY (EGD) WITH PROPOFOL N/A 10/05/2019   Procedure: ESOPHAGOGASTRODUODENOSCOPY (EGD) WITH PROPOFOL;  Surgeon: Lin Landsman, MD;  Location: [place];  Service: Gastroenterology;  Laterality: N/A;   FLEXIBLE SIGMOIDOSCOPY N/A 10/05/2019   Procedure: FLEXIBLE SIGMOIDOSCOPY;  Surgeon: Lin Landsman, MD;  Location: [facility] END[facility]COPY;  Service: Gastroenterology;  Laterality: N/A;   INTRAMEDULLARY (IM) NAIL INTERTROCHANTERIC Right 09/01/2019   Procedure: INTRAMEDULLARY (IM) NAIL INTERTROCHANTRIC AND RADIOFREQUENCY ABLATION;  Surgeon: Hessie Knows, MD;  Location: [facility] ORS;  Service: Orthopedics;  Laterality: Right;   KYPHOPLASTY N/A 08/29/2019   Procedure: KYPHOPLASTY T6, L1,L4 ,  RADIOFREQUENCY ABLATION;  Surgeon: Hessie Knows, MD;  Location: [facility] ORS;  Service: Orthopedics;  Laterality: N/A;   KYPHOPLASTY Right 09/01/2019   Procedure: Right Sacral Radiofrequency Ablation and Cement Augmentation, Right sacrum and  iliac crest;  Surgeon: Hessie Knows, MD;  Location: [facility] OR[facility];  Service: Orthopedics;  Laterality: Right;   PORTA CATH INSERTION N/A 08/28/2019   Procedure: PORTA CATH INSERTION;  Surgeon: Algernon Huxley, MD;  Location: [place] CV LAB;  Service: Cardiovascular;  Laterality: N/A;   Social History   Socioeconomic History   Marital status: Single    Spouse name: Not on file   Number of children: Not on file   Years of education: Not on file   Highest education level: Not on file  Occupational History   Not on file  Tobacco Use   Smoking status: Never   Smokeless tobacco: Never  Vaping Use   Vaping Use: Never used  Substance and Sexual Activity   Alcohol use: Not Currently   Drug use: Not Currently   Sexual activity: Not Currently    Birth control/protection: None  Other Topics Concern   Not on file  Social History Narrative   Lives at home with children   Social Determinants of Health   Financial Resource Strain: Not on file  Food Insecurity: Not on file  Transportation Needs: Not on file  Physical Activity: Not on file  Stress: Not on file  Social Connections: Not on file   Family History  Problem Relation Age of Onset   Colon cancer Maternal Uncle    No Known Allergies Prior to Admission medications   Medication Sig Start Date End Date Taking? Authorizing Provider  dexamethasone (DECADRON) 4 MG tablet Take 1 tablet (4 mg total) by mouth 2 (two) times daily with a meal.  02/14/21  Yes Chrystal, Eulas Post, MD  lansoprazole (PREVACID) 30 MG capsule Take 1 capsule (30 mg total) by mouth daily at 12 noon. 02/14/21  Yes Chrystal, Eulas Post, MD  LORazepam (ATIVAN) 0.5 MG tablet Take 1 tablet (0.5 mg total) by mouth every 6 (six) hours as needed for anxiety. AS NEEDED FOR NAUSEA 02/28/21  Yes Borders, Kirt Boys, NP  oxycodone (OXY-IR) 5 MG capsule Take 1-2 capsules (5-10 mg total) by mouth every 4 (four) hours as needed. 01/20/21  Yes Sindy Guadeloupe, MD  oxyCODONE (OXYCONTIN) 10 mg 12  hr tablet Take 1 tablet (10 mg total) by mouth every 12 (twelve) hours. 02/25/21  Yes Lloyd Huger, MD  albuterol (VENTOLIN HFA) 108 (90 Base) MCG/ACT inhaler Inhale 2 puffs into the lungs every 6 (six) hours as needed for wheezing or shortness of breath. Patient not taking: Reported on 11/19/2020 08/13/20   Borders, Kirt Boys, NP  calcium-vitamin D (OSCAL WITH D) 500-200 MG-UNIT tablet Take 1 tablet by mouth 2 (two) times daily. Patient not taking: Reported on 01/21/2021 10/08/20   Borders, Kirt Boys, NP  cyclobenzaprine (FLEXERIL) 5 MG tablet Take 1 tablet (5 mg total) by mouth 3 (three) times daily as needed for muscle spasms. Patient not taking: Reported on 10/08/2020 09/03/20   Borders, Kirt Boys, NP  diphenoxylate-atropine (LOMOTIL) 2.5-0.025 MG tablet Take 1 tablet by mouth 4 (four) times daily as needed for diarrhea or loose stools. Patient not taking: Reported on 11/19/2020 08/13/20   Borders, Kirt Boys, NP  DULoxetine (CYMBALTA) 30 MG capsule Take 1 capsule (30 mg total) by mouth daily. Patient not taking: Reported on 12/31/2020 12/23/20   Borders, Kirt Boys, NP  ergocalciferol (VITAMIN D2) 1.25 MG (50000 UT) capsule Take 1 capsule (50,000 Units total) by mouth once a week. For 4 weeks. Then 1 capsule once monthly for 4 months Patient not taking: Reported on 01/21/2021 07/12/20   Sindy Guadeloupe, MD  gabapentin (NEURONTIN) 600 MG tablet Take 1 tablet (600 mg total) by mouth 3 (three) times daily. Patient not taking: Reported on 01/28/2021 12/17/20   Sindy Guadeloupe, MD  lidocaine-prilocaine (EMLA) cream Apply 1 application topically as needed. Apply small amount to port site at least 1 hour prior to it being accessed, cover with plastic wrap Patient not taking: Reported on 01/28/2021 02/20/20   Sindy Guadeloupe, MD  ondansetron (ZOFRAN) 8 MG tablet Take 1 tablet (8 mg total) by mouth every 8 (eight) hours as needed for nausea or vomiting. Patient not taking: Reported on 11/19/2020 07/13/20   Borders, Kirt Boys, NP  potassium chloride SA (KLOR-CON) 20 MEQ tablet Take 1 tablet (20 mEq total) by mouth daily for 7 days. Patient not taking: Reported on 11/19/2020 10/08/20 11/19/20  Borders, Kirt Boys, NP  potassium chloride SA (KLOR-CON) 20 MEQ tablet Take 1 tablet (20 mEq total) by mouth once for 1 dose. Patient not taking: Reported on 02/18/2021 01/21/21 01/21/21  Sindy Guadeloupe, MD  prochlorperazine (COMPAZINE) 10 MG tablet Take 1 tablet (10 mg total) by mouth every 6 (six) hours as needed for nausea or vomiting. Patient not taking: Reported on 02/18/2021 02/05/21   Blake Divine, MD   CT ABDOMEN PELVIS W CONTRAST  Result Date: 03/14/2021 CLINICAL DATA:  Abdominal pain.  Metastatic breast cancer. EXAM: CT ABDOMEN AND PELVIS WITH CONTRAST TECHNIQUE: Multidetector CT imaging of the abdomen and pelvis was performed using the standard protocol following bolus administration of intravenous contrast. CONTRAST:  147mL OMNIPAQUE IOHEXOL 300 MG/ML  SOLN COMPARISON:  CT dated 01/19/2021. FINDINGS: Lower chest: An area of atelectasis or scarring in the right lower lobe medially. The visualized lung bases are otherwise clear. Partially visualized central venous line in the right atrium. No intra-abdominal free air or free fluid. Hepatobiliary: No focal liver abnormality is seen. No gallstones, gallbladder wall thickening, or biliary dilatation. Pancreas: Unremarkable. No pancreatic ductal dilatation or surrounding inflammatory changes. Spleen: Normal in size without focal abnormality. Adrenals/Urinary Tract: The adrenal glands unremarkable. The kidneys, visualized ureters, and urinary bladder appear unremarkable. Stomach/Bowel: There is no bowel obstruction or active inflammation. The appendix is normal. Vascular/Lymphatic: The abdominal aorta and IVC are unremarkable. No portal venous gas. There is no adenopathy. Reproductive: The uterus and ovaries are grossly unremarkable. No pelvic masses Other: None Musculoskeletal: L1 and L4  compression fractures with prior vertebroplasty changes similar to prior CT. Several scattered sclerotic lesions in the spine and an area of sclerotic change involving the superior endplate of L2 similar to prior CT. There is retropulsion of posterior cortex of L1 with moderate focal narrowing of the central canal similar to prior CT. Status post prior internal fixation of right femoral neck fracture. There is vertebroplasty changes of the right sacral al a and iliac bone. No acute osseous pathology. IMPRESSION: 1. No acute intra-abdominal or pelvic pathology. No bowel obstruction. Normal appendix. 2. Unchanged L1 and L4 compression fractures with prior vertebroplasty changes. 3. Similar osseous sclerotic metastatic disease. Electronically Signed   By: Anner Crete M.D.   On: 03/14/2021 22:47   DG Femur Min 2 Views Right  Result Date: 03/14/2021 CLINICAL DATA:  Fall, pain EXAM: RIGHT FEMUR 2 VIEWS COMPARISON:  01/21/2021 FINDINGS: Evidence of prior trauma and internal fixation. There is an acute nondisplaced fracture through the lateral cortex in the proximal to mid shaft of the right femur. No displacement. No subluxation or dislocation. IMPRESSION: Lateral cortical periprosthetic acute fracture adjacent to the IM nail in the proximal to mid right femur. Electronically Signed   By: Rolm Baptise M.D.   On: 03/14/2021 18:39    Positive ROS: All other systems have been reviewed and were otherwise negative with the exception of those mentioned in the HPI and as above.  Physical Exam: General: Alert, no acute distress Cardiovascular: No pedal edema Respiratory: No cyanosis, no use of accessory musculature GI: No organomegaly, abdomen is soft and non-tender Skin: No lesions in the area of chief complaint Neurologic: Sensation intact distally Psychiatric: Patient is competent for consent with normal mood and affect Lymphatic: No axillary or cervical lymphadenopathy  MUSCULOSKELETAL: Right thigh pain  with IR/ER of hip. Compartments soft. Good cap refill. Motor and sensory intact distally.  Assessment: Non-displaced, closed fracture of right femur  Plan: The long nail is in good position and I would not recommend any further surgery at this time. She should be touch down weight bearing and follow-up with Dr. Rudene Christians upon discharge. Please call with questions. Also recommend physical therapy.    Lovell Sheehan, MD    03/15/2021 8:19 AM

## 2021-03-16 ENCOUNTER — Inpatient Hospital Stay: Payer: Self-pay

## 2021-03-16 ENCOUNTER — Inpatient Hospital Stay: Payer: Self-pay | Admitting: Oncology

## 2021-03-16 DIAGNOSIS — M978XXA Periprosthetic fracture around other internal prosthetic joint, initial encounter: Secondary | ICD-10-CM

## 2021-03-16 DIAGNOSIS — Z96649 Presence of unspecified artificial hip joint: Secondary | ICD-10-CM

## 2021-03-16 MED ORDER — POLYETHYLENE GLYCOL 3350 17 G PO PACK
17.0000 g | PACK | Freq: Every day | ORAL | Status: DC
  Administered 2021-03-16: 13:00:00 17 g via ORAL
  Filled 2021-03-16: qty 1

## 2021-03-16 MED ORDER — CHLORHEXIDINE GLUCONATE CLOTH 2 % EX PADS
6.0000 | MEDICATED_PAD | Freq: Every day | CUTANEOUS | Status: DC
  Administered 2021-03-16: 09:00:00 6 via TOPICAL

## 2021-03-16 NOTE — Progress Notes (Signed)
Met with the patient at the bedside She has no Insurance She lives at home with her minor children and has some help from a friend Her oldest is 1 and does drive The kids will be going back to school next week She needs a light weight manuel Wheelchair and a 3 in 1 I notified [employer] that this will also be thru [place],, they will be delivered to the room prior to [place] I notified Tommi Rumps at [place] of the need for [facility] it is [facility] week She was provided [facility] and requested to give them a call for an appointment next week when they are open back from holiday closure I notified her that she can obtain her non narcotic medications if needed at [facility] and explained their location and hours Her friend will provide transportation No additional needs at this time

## 2021-03-16 NOTE — Progress Notes (Cosign Needed Addendum)
Patient suffers from R femur fracture which impairs her ability to perform daily activities like toileting, feeding, dressing, grooming, bathing in the home. A cane, walker, or crutch will not resolve the patient's issue with performing activities of daily living. A lightweight wheelchair and cushion is required/recommended and will allow patient to safely perform daily activities.   Patient can safely propel the wheelchair in the home or has a caregiver who can provide assistance

## 2021-03-16 NOTE — Progress Notes (Signed)
Discharge Note: Reviewed discharge instructions with pt. Pt verbalized understanding. Iv cath intact upon removal. Pt discharged with DME 3 in 1 and a lightweight wheelchair. Staff wheeled pt out. Pt transported to home via family private vehicle.

## 2021-03-16 NOTE — Discharge Summary (Signed)
Physician Discharge Summary  Sophia [name] TIW:[id number] DOB: 1981/01/22 DOA: 03/14/2021  PCP: Sindy Guadeloupe, MD  Admit date: 03/14/2021 Discharge date: 03/16/2021  Admitted From: Home Disposition:  Home  Recommendations for Outpatient Follow-up:  Follow up with PCP in 1-2 weeks Follow up with Orthopedic Surgery as scheduled  Home Health:HHPT through [facility]  Equipment/Devices:Wheelchair and 3 in 1    Discharge Condition:Stable CODE STATUS:Full Diet recommendation: Regular   Brief/Interim Summary: 40 y.o. female seen in ed with complaints of rt leg pain since sat after her fall on saturday, while she was reaching for something in her car. Pain is constant, 10/10, sharp, NR, worse with movements.pt thinks she is sob as she gets agitated.  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 Given Iv morphine added for severe pain as she is having lots of pain Venous US of RLE to r/o dvt as increase swelling on exam No surgery at this time per Surgery recs Touch down wt bearing and f/u with Dr. Rudene Christians on discharge. PT consulted. Pt to be arranged for HHPT Pt reports she still has oxycodone at home    Breast cancer: F/u with oncology oupatient Pain mx    Discharge Instructions   Allergies as of 03/16/2021   No Known Allergies      Medication List     STOP taking these medications    cyclobenzaprine 5 MG tablet Commonly known as: FLEXERIL   ondansetron 8 MG tablet Commonly known as: ZOFRAN   potassium chloride SA 20 MEQ tablet Commonly known as: KLOR-CON M       TAKE these medications    albuterol 108 (90 Base) MCG/ACT inhaler Commonly known as: VENTOLIN HFA Inhale 2 puffs into the lungs every 6  (six) hours as needed for wheezing or shortness of breath.   calcium-vitamin D 500-200 MG-UNIT tablet Commonly known as: OSCAL WITH D Take 1 tablet by mouth 2 (two) times daily.   dexamethasone 4 MG tablet Commonly known as: DECADRON Take 1 tablet (4 mg total) by mouth 2 (two) times daily with a meal.   diphenoxylate-atropine 2.5-0.025 MG tablet Commonly known as: LOMOTIL Take 1 tablet by mouth 4 (four) times daily as needed for diarrhea or loose stools.   DULoxetine 30 MG capsule Commonly known as: CYMBALTA Take 1 capsule (30 mg total) by mouth daily.   ergocalciferol 1.25 MG (50000 UT) capsule Commonly known as: VITAMIN D2 Take 1 capsule (50,000 Units total) by mouth once a week. For 4 weeks. Then 1 capsule once monthly for 4 months   gabapentin 600 MG tablet Commonly known as: Neurontin Take 1 tablet (600 mg total) by mouth 3 (three) times daily.   lansoprazole 30 MG capsule Commonly known as: PREVACID Take 1 capsule (30 mg total) by mouth daily at 12 noon.   lidocaine-prilocaine cream Commonly known as: EMLA Apply 1 application topically as needed. Apply small amount to port site at least 1 hour prior to it being accessed, cover with plastic wrap   LORazepam 0.5 MG tablet Commonly known as: ATIVAN Take 1 tablet (0.5 mg total) by mouth every 6 (six) hours as needed for anxiety. AS NEEDED FOR NAUSEA   oxycodone 5 MG capsule Commonly known as: OXY-IR Take  1-2 capsules (5-10 mg total) by mouth every 4 (four) hours as needed.   oxyCODONE 10 mg 12 hr tablet Commonly known as: OXYCONTIN Take 1 tablet (10 mg total) by mouth every 12 (twelve) hours.   prochlorperazine 10 MG tablet Commonly known as: COMPAZINE Take 1 tablet (10 mg total) by mouth every 6 (six) hours as needed for nausea or vomiting.               Durable Medical Equipment  (From admission, onward)           Start     Ordered   03/16/21 1018  For home use only DME lightweight manual  wheelchair with seat cushion  Once       Comments: Patient suffers from R femur fracture which impairs her ability to perform daily activities like toileting, feeding, dressing, grooming, bathing in the home. A cane, walker, or crutch will not resolve the patient's issue with performing activities of daily living. A lightweight wheelchair and cushion is required/recommended and will allow patient to safely perform daily activities.   Patient can safely propel the wheelchair in the home or has a caregiver who can provide assistance   03/16/21 1018            Follow-up Information     Lovell Sheehan, MD.   Specialty: Orthopedic Surgery Contact information: [place] [address] [phone]                No Known Allergies  Consultations: Orthopedic Surgery  Procedures/Studies: CT ABDOMEN PELVIS W CONTRAST  Result Date: 03/14/2021 CLINICAL DATA:  Abdominal pain.  Metastatic breast cancer. EXAM: CT ABDOMEN AND PELVIS WITH CONTRAST TECHNIQUE: Multidetector CT imaging of the abdomen and pelvis was performed using the standard protocol following bolus administration of intravenous contrast. CONTRAST:  170mL OMNIPAQUE IOHEXOL 300 MG/ML  SOLN COMPARISON:  CT dated 01/19/2021. FINDINGS: Lower chest: An area of atelectasis or scarring in the right lower lobe medially. The visualized lung bases are otherwise clear. Partially visualized central venous line in the right atrium. No intra-abdominal free air or free fluid. Hepatobiliary: No focal liver abnormality is seen. No gallstones, gallbladder wall thickening, or biliary dilatation. Pancreas: Unremarkable. No pancreatic ductal dilatation or surrounding inflammatory changes. Spleen: Normal in size without focal abnormality. Adrenals/Urinary Tract: The adrenal glands unremarkable. The kidneys, visualized ureters, and urinary bladder appear unremarkable. Stomach/Bowel: There is no bowel obstruction or active  inflammation. The appendix is normal. Vascular/Lymphatic: The abdominal aorta and IVC are unremarkable. No portal venous gas. There is no adenopathy. Reproductive: The uterus and ovaries are grossly unremarkable. No pelvic masses Other: None Musculoskeletal: L1 and L4 compression fractures with prior vertebroplasty changes similar to prior CT. Several scattered sclerotic lesions in the spine and an area of sclerotic change involving the superior endplate of L2 similar to prior CT. There is retropulsion of posterior cortex of L1 with moderate focal narrowing of the central canal similar to prior CT. Status post prior internal fixation of right femoral neck fracture. There is vertebroplasty changes of the right sacral al a and iliac bone. No acute osseous pathology. IMPRESSION: 1. No acute intra-abdominal or pelvic pathology. No bowel obstruction. Normal appendix. 2. Unchanged L1 and L4 compression fractures with prior vertebroplasty changes. 3. Similar osseous sclerotic metastatic disease. Electronically Signed   By: Anner Crete M.D.   On: 03/14/2021 22:47   US Venous Img Lower Bilateral (DVT)  Result Date: 03/15/2021 EXAM: BILATERAL LOWER EXTREMITY VENOUS  DOPPLER ULTRASOUND TECHNIQUE: Gray-scale sonography with graded compression, as well as color Doppler and duplex ultrasound were performed to evaluate the lower extremity deep venous systems from the level of the common femoral vein and including the common femoral, femoral, profunda femoral, popliteal and calf veins including the posterior tibial, peroneal and gastrocnemius veins when visible. The superficial great saphenous vein was also interrogated. Spectral Doppler was utilized to evaluate flow at rest and with distal augmentation maneuvers in the common femoral, femoral and popliteal veins. COMPARISON:  None. FINDINGS: RIGHT LOWER EXTREMITY Common Femoral Vein: No evidence of thrombus. Normal compressibility, respiratory phasicity and response to  augmentation. Saphenofemoral Junction: No evidence of thrombus. Normal compressibility and flow on color Doppler imaging. Profunda Femoral Vein: No evidence of thrombus. Normal compressibility and flow on color Doppler imaging. Femoral Vein: No evidence of thrombus. Normal compressibility, respiratory phasicity and response to augmentation. Popliteal Vein: No evidence of thrombus. Normal compressibility, respiratory phasicity and response to augmentation. Calf Veins: No evidence of thrombus. Normal compressibility and flow on color Doppler imaging. LEFT LOWER EXTREMITY Common Femoral Vein: No evidence of thrombus. Normal compressibility, respiratory phasicity and response to augmentation. Saphenofemoral Junction: No evidence of thrombus. Normal compressibility and flow on color Doppler imaging. Profunda Femoral Vein: No evidence of thrombus. Normal compressibility and flow on color Doppler imaging. Femoral Vein: No evidence of thrombus. Normal compressibility, respiratory phasicity and response to augmentation. Popliteal Vein: No evidence of thrombus. Normal compressibility, respiratory phasicity and response to augmentation. Calf Veins: No evidence of thrombus. Normal compressibility and flow on color Doppler imaging. Other Findings: Bilateral Baker's cysts identified in popliteal fossas, measuring 2.4 x 0.9 x 1.7 cm on the right, and 2.0 x 0.6 x 1.4 cm on the left. IMPRESSION: No evidence of deep venous thrombosis in either lower extremity. Electronically Signed   By: Albin Felling M.D.   On: 03/15/2021 10:46   DG Femur Min 2 Views Right  Result Date: 03/14/2021 CLINICAL DATA:  Fall, pain EXAM: RIGHT FEMUR 2 VIEWS COMPARISON:  01/21/2021 FINDINGS: Evidence of prior trauma and internal fixation. There is an acute nondisplaced fracture through the lateral cortex in the proximal to mid shaft of the right femur. No displacement. No subluxation or dislocation. IMPRESSION: Lateral cortical periprosthetic acute  fracture adjacent to the IM nail in the proximal to mid right femur. Electronically Signed   By: Rolm Baptise M.D.   On: 03/14/2021 18:39    Subjective: Without complaints  Discharge Exam: Vitals:   03/16/21 0750 03/16/21 1208  BP: 116/77 119/68  Pulse: 62 85  Resp: 15 15  Temp: 98.2 F (36.8 C) 98.3 F (36.8 C)  SpO2: 99% 99%   Vitals:   03/16/21 0004 03/16/21 0346 03/16/21 0750 03/16/21 1208  BP: 108/65 113/72 116/77 119/68  Pulse: 71 67 62 85  Resp: 17 17 15 15   Temp: 98.4 F (36.9 C) 98.1 F (36.7 C) 98.2 F (36.8 C) 98.3 F (36.8 C)  TempSrc:  Oral    SpO2: 99% 98% 99% 99%  Weight:      Height:        General: Pt is alert, awake, not in acute distress Cardiovascular: RRR, S1/S2  Respiratory: CTA bilaterally, no wheezing, no rhonchi Abdominal: Soft, NT, ND, bowel sounds + Extremities: no edema, no cyanosis   The results of significant diagnostics from this hospitalization (including imaging, microbiology, ancillary and laboratory) are listed below for reference.     Microbiology: Recent Results (from the past 240 hour(s))  Resp  Panel by RT-PCR (Flu A&B, Covid) Nasopharyngeal Swab     Status: None   Collection Time: 03/14/21  9:49 PM   Specimen: Nasopharyngeal Swab; Nasopharyngeal(NP) swabs in vial transport medium  Result Value Ref Range Status   SARS Coronavirus 2 by RT PCR NEGATIVE NEGATIVE Final    Comment: (NOTE) SARS-CoV-2 target nucleic acids are NOT DETECTED.  The SARS-CoV-2 RNA is generally detectable in upper respiratory specimens during the acute phase of infection. The lowest concentration of SARS-CoV-2 viral copies this assay can detect is 138 copies/mL. A negative result does not preclude SARS-Cov-2 infection and should not be used as the sole basis for treatment or other patient management decisions. A negative result may occur with  improper specimen collection/handling, submission of specimen other than nasopharyngeal swab, presence of  viral mutation(s) within the areas targeted by this assay, and inadequate number of viral copies(<138 copies/mL). A negative result must be combined with clinical observations, patient history, and epidemiological information. The expected result is Negative.  Fact Sheet for Patients:  EntrepreneurPulse.com.au  Fact Sheet for Healthcare Providers:  IncredibleEmployment.be  This test is no t yet approved or cleared by the [place] FDA and  has been authorized for detection and/or diagnosis of SARS-CoV-2 by FDA under an Emergency Use Authorization (EUA). This EUA will remain  in effect (meaning this test can be used) for the duration of the COVID-19 declaration under Section 564(b)(1) of the Act, 21 U.S.C.section 360bbb-3(b)(1), unless the authorization is terminated  or revoked sooner.       Influenza A by PCR NEGATIVE NEGATIVE Final   Influenza B by PCR NEGATIVE NEGATIVE Final    Comment: (NOTE) The Xpert Xpress SARS-CoV-2/FLU/RSV plus assay is intended as an aid in the diagnosis of influenza from Nasopharyngeal swab specimens and should not be used as a sole basis for treatment. Nasal washings and aspirates are unacceptable for Xpert Xpress SARS-CoV-2/FLU/RSV testing.  Fact Sheet for Patients: EntrepreneurPulse.com.au  Fact Sheet for Healthcare Providers: IncredibleEmployment.be  This test is not yet approved or cleared by the [place] FDA and has been authorized for detection and/or diagnosis of SARS-CoV-2 by FDA under an Emergency Use Authorization (EUA). This EUA will remain in effect (meaning this test can be used) for the duration of the COVID-19 declaration under Section 564(b)(1) of the Act, 21 U.S.C. section 360bbb-3(b)(1), unless the authorization is terminated or revoked.  Performed at [facility], [address] [place], [place] [address]      Labs: BNP (last 3  results) No results for input(s): BNP in the last 8760 hours. Basic Metabolic Panel: Recent Labs  Lab 03/14/21 2149  NA 135  K 3.6  CL 103  CO2 24  GLUCOSE 108*  BUN 16  CREATININE 0.61  CALCIUM 9.1   Liver Function Tests: Recent Labs  Lab 03/14/21 2149  AST 23  ALT 24  ALKPHOS 73  BILITOT 0.7  PROT 7.0  ALBUMIN 3.6   No results for input(s): LIPASE, AMYLASE in the last 168 hours. No results for input(s): AMMONIA in the last 168 hours. CBC: Recent Labs  Lab 03/14/21 2149 03/15/21 0616  WBC 8.8 6.8  NEUTROABS 7.2  --   HGB 13.2 12.2  HCT 39.4 36.4  MCV 89.3 88.8  PLT 300 280   Cardiac Enzymes: No results for input(s): CKTOTAL, CKMB, CKMBINDEX, TROPONINI in the last 168 hours. BNP: Invalid input(s): POCBNP CBG: No results for input(s): GLUCAP in the last 168 hours. D-Dimer No results for input(s):  DDIMER in the last 72 hours. Hgb A1c No results for input(s): HGBA1C in the last 72 hours. Lipid Profile No results for input(s): CHOL, HDL, LDLCALC, TRIG, CHOLHDL, LDLDIRECT in the last 72 hours. Thyroid function studies No results for input(s): TSH, T4TOTAL, T3FREE, THYROIDAB in the last 72 hours.  Invalid input(s): FREET3 Anemia work up No results for input(s): VITAMINB12, FOLATE, FERRITIN, TIBC, IRON, RETICCTPCT in the last 72 hours. Urinalysis    Component Value Date/Time   COLORURINE STRAW (A) 12/10/2020 1326   APPEARANCEUR CLEAR (A) 12/10/2020 1326   LABSPEC 1.005 12/10/2020 1326   PHURINE 6.0 12/10/2020 1326   GLUCOSEU NEGATIVE 12/10/2020 1326   HGBUR NEGATIVE 12/10/2020 1326   BILIRUBINUR NEGATIVE 12/10/2020 1326   KETONESUR NEGATIVE 12/10/2020 1326   PROTEINUR NEGATIVE 12/10/2020 1326   NITRITE NEGATIVE 12/10/2020 1326   [place] 12/10/2020 1326   Sepsis Labs Invalid input(s): PROCALCITONIN,  WBC,  LACTICIDVEN Microbiology Recent Results (from the past 240 hour(s))  Resp Panel by RT-PCR (Flu A&B, Covid) Nasopharyngeal Swab      Status: None   Collection Time: 03/14/21  9:49 PM   Specimen: Nasopharyngeal Swab; Nasopharyngeal(NP) swabs in vial transport medium  Result Value Ref Range Status   SARS Coronavirus 2 by RT PCR NEGATIVE NEGATIVE Final    Comment: (NOTE) SARS-CoV-2 target nucleic acids are NOT DETECTED.  The SARS-CoV-2 RNA is generally detectable in upper respiratory specimens during the acute phase of infection. The lowest concentration of SARS-CoV-2 viral copies this assay can detect is 138 copies/mL. A negative result does not preclude SARS-Cov-2 infection and should not be used as the sole basis for treatment or other patient management decisions. A negative result may occur with  improper specimen collection/handling, submission of specimen other than nasopharyngeal swab, presence of viral mutation(s) within the areas targeted by this assay, and inadequate number of viral copies(<138 copies/mL). A negative result must be combined with clinical observations, patient history, and epidemiological information. The expected result is Negative.  Fact Sheet for Patients:  EntrepreneurPulse.com.au  Fact Sheet for Healthcare Providers:  IncredibleEmployment.be  This test is no t yet approved or cleared by the [place] FDA and  has been authorized for detection and/or diagnosis of SARS-CoV-2 by FDA under an Emergency Use Authorization (EUA). This EUA will remain  in effect (meaning this test can be used) for the duration of the COVID-19 declaration under Section 564(b)(1) of the Act, 21 U.S.C.section 360bbb-3(b)(1), unless the authorization is terminated  or revoked sooner.       Influenza A by PCR NEGATIVE NEGATIVE Final   Influenza B by PCR NEGATIVE NEGATIVE Final    Comment: (NOTE) The Xpert Xpress SARS-CoV-2/FLU/RSV plus assay is intended as an aid in the diagnosis of influenza from Nasopharyngeal swab specimens and should not be used as a sole basis  for treatment. Nasal washings and aspirates are unacceptable for Xpert Xpress SARS-CoV-2/FLU/RSV testing.  Fact Sheet for Patients: EntrepreneurPulse.com.au  Fact Sheet for Healthcare Providers: IncredibleEmployment.be  This test is not yet approved or cleared by the [place] FDA and has been authorized for detection and/or diagnosis of SARS-CoV-2 by FDA under an Emergency Use Authorization (EUA). This EUA will remain in effect (meaning this test can be used) for the duration of the COVID-19 declaration under Section 564(b)(1) of the Act, 21 U.S.C. section 360bbb-3(b)(1), unless the authorization is terminated or revoked.  Performed at [facility], [address], [place], [place] [address]    Time spent: 59min  SIGNED:  Marylu Lund, MD  [facility] 03/16/2021, 1:37 PM  If 7PM-7AM, please contact night-coverage

## 2021-03-16 NOTE — Progress Notes (Signed)
Physical Therapy Treatment Patient Details Name: Sophia [name] MRN: [id number] DOB: 1981/02/10 Today's Date: 03/16/2021   History of Present Illness 40 y/o female here after sudden onset R mid thigh pain while WBing/twisting (?) reaching for something in her car and a few days of inability to bear weight.  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  She had R sacroplasty and prophylactic R hip IM nail 09/01/19 having had multi-level kyphoplasty earlier that week.  She is now found to have R non-displaced femur fracture that is non-operable.    PT Comments    Pt received w/c and PT spent good bit of session putting together and explaining about the components with pt and answering questions and getting her to show back appropriate function. Educated on and performed w/c propulsion and management with pt showing good ability to safely control and transfer to/from with multiple strategies using walker and squat pivot transfers.  Also educated on stair negotiation with assist.  Pt did well, asked many appropriate questions that were answered to her satisfaction and overall pt did very well and is eager to go home and be with her children.      Recommendations for follow up therapy are one component of a multi-disciplinary discharge planning process, led by the attending physician.  Recommendations may be updated based on patient status, additional functional criteria and insurance authorization.  Follow Up Recommendations  Home health PT     Assistance Recommended at Discharge Frequent or constant Supervision/Assistance  Equipment Recommendations  Wheelchair (measurements PT);Wheelchair cushion (measurements PT)    Recommendations for Other Services       Precautions / Restrictions Precautions Precautions: Fall Restrictions RLE Weight Bearing: Touchdown weight bearing     Mobility  Bed Mobility               General bed mobility comments: in recliner  on arrival, post session    Transfers Overall transfer level: Modified independent Equipment used: Rolling walker (2 wheels)               General transfer comment: Pt was able to easily rise to standing and maintain TWBing on the R, also educated and performed on transfer to/from w/c to recliner/bed - pt did well while safely maintaining WBing status    Ambulation/Gait Ambulation/Gait assistance: Modified independent (Device/Increase time) Gait Distance (Feet): 250 Feet Assistive device:  (wheelchair)         General Gait Details: Educated on different strategies for w/c propulsion, turning and tight area managment.  Pt did well and was able to indepdnently advance w/c as well as show back each aspect of the w/c components (brakes, leg lifters, arm rests, etc)   Stairs Stairs: Yes Stairs assistance: Total assist Stair Management: Backwards;Wheelchair Number of Stairs: 1 General stair comments: educated and performed strategy for negotiating single step to enter home with assist   Wheelchair Mobility    Modified Rankin (Stroke Patients Only)       Balance                                            Cognition Arousal/Alertness: Awake/alert Behavior During Therapy: WFL for tasks assessed/performed Overall Cognitive Status: Within Functional Limits for tasks assessed  Exercises      General Comments General comments (skin integrity, edema, etc.): pt able to maintain balance well with walker, able to squat pivot safely with UE use and R NWBing w/o balance/safety concerns      Pertinent Vitals/Pain Pain Score: 3  Pain Location: mid R thigh    Home Living                          Prior Function            PT Goals (current goals can now be found in the care plan section) Progress towards PT goals: Progressing toward goals    Frequency    7X/week      PT Plan  Current plan remains appropriate    Co-evaluation              AM-PAC PT "6 Clicks" Mobility   Outcome Measure  Help needed turning from your back to your side while in a flat bed without using bedrails?: None Help needed moving from lying on your back to sitting on the side of a flat bed without using bedrails?: A Little Help needed moving to and from a bed to a chair (including a wheelchair)?: A Little Help needed standing up from a chair using your arms (e.g., wheelchair or bedside chair)?: A Little Help needed to walk in hospital room?: A Lot Help needed climbing 3-5 steps with a railing? : Total 6 Click Score: 16    End of Session Equipment Utilized During Treatment: Gait belt Activity Tolerance: Patient limited by fatigue;Patient tolerated treatment well;Patient limited by pain Patient left: with chair alarm set;with call bell/phone within reach Nurse Communication: Mobility status PT Visit Diagnosis: Muscle weakness (generalized) (M62.81);Difficulty in walking, not elsewhere classified (R26.2);Pain Pain - Right/Left: Right Pain - part of body: Leg     Time: 1425-1505 PT Time Calculation (min) (ACUTE ONLY): 40 min  Charges:  $Gait Training: 8-22 mins $Therapeutic Activity: 8-22 mins                     Kreg Shropshire, DPT 03/16/2021, 4:27 PM

## 2021-03-17 ENCOUNTER — Telehealth: Payer: Self-pay | Admitting: Oncology

## 2021-03-17 NOTE — Telephone Encounter (Signed)
Pt is being discharged from hospital today and needs a 2 week follow up appt. Call back at [phone]

## 2021-03-17 NOTE — Telephone Encounter (Signed)
Spoke with patient and she confirmed appointment with Beckey Rutter (per Dr. Janese Banks).

## 2021-03-22 ENCOUNTER — Inpatient Hospital Stay: Payer: Self-pay | Attending: Oncology | Admitting: Nurse Practitioner

## 2021-03-22 ENCOUNTER — Other Ambulatory Visit: Payer: Self-pay

## 2021-03-22 ENCOUNTER — Telehealth: Payer: Self-pay

## 2021-03-22 VITALS — BP 114/80 | HR 117 | Resp 18 | Wt 137.0 lb

## 2021-03-22 DIAGNOSIS — L589 Radiodermatitis, unspecified: Secondary | ICD-10-CM

## 2021-03-22 DIAGNOSIS — C50412 Malignant neoplasm of upper-outer quadrant of left female breast: Secondary | ICD-10-CM | POA: Insufficient documentation

## 2021-03-22 DIAGNOSIS — C7931 Secondary malignant neoplasm of brain: Secondary | ICD-10-CM | POA: Insufficient documentation

## 2021-03-22 DIAGNOSIS — C50919 Malignant neoplasm of unspecified site of unspecified female breast: Secondary | ICD-10-CM

## 2021-03-22 DIAGNOSIS — F419 Anxiety disorder, unspecified: Secondary | ICD-10-CM | POA: Insufficient documentation

## 2021-03-22 DIAGNOSIS — L819 Disorder of pigmentation, unspecified: Secondary | ICD-10-CM | POA: Insufficient documentation

## 2021-03-22 DIAGNOSIS — F411 Generalized anxiety disorder: Secondary | ICD-10-CM

## 2021-03-22 DIAGNOSIS — G893 Neoplasm related pain (acute) (chronic): Secondary | ICD-10-CM | POA: Insufficient documentation

## 2021-03-22 DIAGNOSIS — C801 Malignant (primary) neoplasm, unspecified: Secondary | ICD-10-CM

## 2021-03-22 DIAGNOSIS — Z5112 Encounter for antineoplastic immunotherapy: Secondary | ICD-10-CM | POA: Insufficient documentation

## 2021-03-22 DIAGNOSIS — Z171 Estrogen receptor negative status [ER-]: Secondary | ICD-10-CM | POA: Insufficient documentation

## 2021-03-22 DIAGNOSIS — S72401D Unspecified fracture of lower end of right femur, subsequent encounter for closed fracture with routine healing: Secondary | ICD-10-CM

## 2021-03-22 DIAGNOSIS — L708 Other acne: Secondary | ICD-10-CM

## 2021-03-22 DIAGNOSIS — C7951 Secondary malignant neoplasm of bone: Secondary | ICD-10-CM | POA: Insufficient documentation

## 2021-03-22 NOTE — Progress Notes (Signed)
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 Dry eyes and burning on forehead. Over weekend she had draining from eyes like and infection.

## 2021-03-22 NOTE — Progress Notes (Signed)
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  History obtained with Sophia help of an interpreter.[name] self palpated left breast mass which was followed by a diagnostic bilateral mammogram.  Mammogram showed 3.1 x 2.9 x 1.9 cm hypoechoic mass at Sophia 1 o'clock position of Sophia left breast.  For abnormal cortically thickened left axillary lymph nodes measuring up to 5 mm.  Both Sophia breast mass and one of Sophia lymph nodes was biopsied and was consistent with invasive mammary carcinoma grade 2 ER/PR negative and HER-2 positive IHC +3.  [name] was also having ongoing back pain and was seen by Sophia [name] orthopedics Sophia [name] who ordered MRI lumbar spine without contrast which showed possible pathologic fractures of L1 and L4 vertebral bodies with greater than 50% height loss at L1 and abnormal signal involving L2-L3 S1 as well as right iliac bone concerning for metastatic disease.  [name] is a single mother of 3 adult children and is very anxious today.  Sophia [name] reports significant back pain which radiates to her bilateral thighs.  Denies any focal tingling numbness or weakness.  Denies any bowel bladder incontinence.  Pain has been uncontrolled despite taking Tylenol.  No prior history of abnormal breast biopsies.  No family history of breast cancer   PET and MRI showed 3 areas of pathologic  fracture of her spine as well as widespread bony metastatic disease and concern for impending fracture of Sophia right hip.  Given her worsening pain Sophia [name] was asked to come to Sophia ER.  Sophia [name] has been evaluated by Sophia [name] from orthopedic surgery and underwent kyphoplasty at 3 different levels. T6 L1 and L4 along with radiofrequency ablation.    Sophia [name] also underwent prophylactic fixation of Sophia right hip and not affected Sophia sacral region.     [name] received first dose of Herceptin and Perjeta on 09/04/2019.  Baseline echocardiogram normal.Sophia [name] is currently getting taxol/ herceptin/perjeta   [name] admitted to hospital for acute abdominal pain with CT findings concerning for acute colitis.  Colonoscopy showed diffuse severe inflammation with erythema friability and loss of vascularity and shallow ulcerations in Sophia IC valve, ascending colon and cecum.    Interval history-[name] is 41 year old female with above history of metastatic breast cancer who presents to clinic for hospital follow-up.  Sophia [name] suffered a fall with increased right leg pain and disability and presented to Sophia ER on 03/14/2021.  Sophia [name] was found to have a right femur fracture near IM nail.  Sophia [name] received pain medicine for.  Venous ultrasound of Sophia right lower extremity was negative for DVT.  Surgery was not recommended.  Sophia [name] has a history of right femur prophylactic nailing in July 2021 with Sophia [name]. Denies pain except when moving her leg. Has a wheelchair. Home health has not been out. Sophia [name] is concerned about discoloration of her scalp post radiation and acne like rash. Has occasional nosebleeds.   ECOG PS- 3 Pain scale- 0 Opioid  associated constipation- no  Review of systems- Review of Systems  Constitutional:  Positive for malaise/fatigue. Negative for chills, fever and weight loss.  HENT:  Negative for congestion, ear discharge and nosebleeds.   Eyes:  Negative for blurred vision.  Respiratory:  Negative for cough, hemoptysis, sputum  production, shortness of breath and wheezing.   Cardiovascular:  Negative for chest pain, palpitations, orthopnea and claudication.  Gastrointestinal:  Negative for abdominal pain, blood in stool, constipation, diarrhea, heartburn, melena, nausea and vomiting.  Genitourinary:  Negative for dysuria, flank pain, frequency, hematuria and urgency.  Musculoskeletal:  Positive for falls. Negative for back pain, joint pain and myalgias.       Right leg pain at distal femur.   Skin:  Negative for rash.       Acneiform rash on face. Hyperpigmentation of scalp post radiation.   Neurological:  Negative for dizziness, tingling, focal weakness, seizures, weakness and headaches.  Endo/Heme/Allergies:  Does not bruise/bleed easily.  Psychiatric/Behavioral:  Negative for depression and suicidal ideas. Sophia [name] does not have insomnia.      No Known Allergies   Past Medical History:  Diagnosis Date   Anxiety    Breast cancer ([place])    with mets   Cancer ([place])    Colitis    COVID-19 in immunocompromised [name] ([place])    Family history of colon cancer    Vertigo      Past Surgical History:  Procedure Laterality Date   BREAST BIOPSY Left 08/14/2019   [place] bx of mass, coil marker, path pending   BREAST BIOPSY Left 08/14/2019   [place] bx of LN, hydromarker, path pending   BREAST BIOPSY Left 08/14/2019   affirm bx of calcs, x marker, path pending   ESOPHAGOGASTRODUODENOSCOPY (EGD) WITH PROPOFOL N/A 10/05/2019   Procedure: ESOPHAGOGASTRODUODENOSCOPY (EGD) WITH PROPOFOL;  Surgeon: Lin Landsman, MD;  Location: [facility];  Service: Gastroenterology;  Laterality: N/A;   FLEXIBLE SIGMOIDOSCOPY N/A 10/05/2019   Procedure: FLEXIBLE SIGMOIDOSCOPY;  Surgeon: Lin Landsman, MD;  Location: [facility] ENDOSCOPY;  Service: Gastroenterology;  Laterality: N/A;   INTRAMEDULLARY (IM) NAIL INTERTROCHANTERIC Right 09/01/2019   Procedure: INTRAMEDULLARY (IM) NAIL INTERTROCHANTRIC AND RADIOFREQUENCY ABLATION;   Surgeon: Hessie Knows, MD;  Location: [facility] ORS;  Service: Orthopedics;  Laterality: Right;   KYPHOPLASTY N/A 08/29/2019   Procedure: KYPHOPLASTY T6, L1,L4 ,  RADIOFREQUENCY ABLATION;  Surgeon: Hessie Knows, MD;  Location: [facility] ORS;  Service: Orthopedics;  Laterality: N/A;   KYPHOPLASTY Right 09/01/2019   Procedure: Right Sacral Radiofrequency Ablation and Cement Augmentation, Right sacrum and iliac crest;  Surgeon: Hessie Knows, MD;  Location: [facility];  Service: Orthopedics;  Laterality: Right;   PORTA CATH INSERTION N/A 08/28/2019   Procedure: PORTA CATH INSERTION;  Surgeon: Algernon Huxley, MD;  Location: [facility];  Service: Cardiovascular;  Laterality: N/A;    Social History   Socioeconomic History   Marital status: Single    Spouse name: Not on file   Number of children: Not on file   Years of education: Not on file   Highest education level: Not on file  Occupational History   Not on file  Tobacco Use   Smoking status: Never   Smokeless tobacco: Never  Vaping Use   Vaping Use: Never used  Substance and Sexual Activity   Alcohol use: Not Currently   Drug use: Not Currently   Sexual activity: Not Currently    Birth control/protection: None  Other Topics Concern   Not on file  Social History Narrative   Lives at home with children   Social Determinants of Health   Financial Resource Strain: Not on file  Food Insecurity: Not on file  Transportation Needs: Not on file  Physical Activity: Not on file  Stress: Not on file  Social Connections: Not on file  Intimate Partner Violence: Not on file    Family History  Problem Relation Age of Onset   Colon cancer Maternal Uncle      Current Outpatient Medications:    albuterol (VENTOLIN HFA) 108 (90 Base) MCG/ACT inhaler, Inhale 2 puffs into Sophia lungs every 6 (six) hours as needed for wheezing or shortness of breath. ([name] not taking: Reported on 11/19/2020), Disp: 8 g, Rfl: 2   calcium-vitamin D (OSCAL WITH D)  500-200 MG-UNIT tablet, Take 1 tablet by mouth 2 (two) times daily. ([name] not taking: Reported on 01/21/2021), Disp: 30 tablet, Rfl: 0   dexamethasone (DECADRON) 4 MG tablet, Take 1 tablet (4 mg total) by mouth 2 (two) times daily with a meal., Disp: 60 tablet, Rfl: 0   diphenoxylate-atropine (LOMOTIL) 2.5-0.025 MG tablet, Take 1 tablet by mouth 4 (four) times daily as needed for diarrhea or loose stools. ([name] not taking: Reported on 11/19/2020), Disp: 40 tablet, Rfl: 0   DULoxetine (CYMBALTA) 30 MG capsule, Take 1 capsule (30 mg total) by mouth daily. ([name] not taking: Reported on 12/31/2020), Disp: 30 capsule, Rfl: 3   ergocalciferol (VITAMIN D2) 1.25 MG (50000 UT) capsule, Take 1 capsule (50,000 Units total) by mouth once a week. For 4 weeks. Then 1 capsule once monthly for 4 months ([name] not taking: Reported on 01/21/2021), Disp: 8 capsule, Rfl: 0   gabapentin (NEURONTIN) 600 MG tablet, Take 1 tablet (600 mg total) by mouth 3 (three) times daily. ([name] not taking: Reported on 01/28/2021), Disp: 90 tablet, Rfl: 3   lansoprazole (PREVACID) 30 MG capsule, Take 1 capsule (30 mg total) by mouth daily at 12 noon., Disp: 30 capsule, Rfl: 1   lidocaine-prilocaine (EMLA) cream, Apply 1 application topically as needed. Apply small amount to port site at least 1 hour prior to it being accessed, cover with plastic wrap ([name] not taking: Reported on 01/28/2021), Disp: 30 g, Rfl: 1   LORazepam (ATIVAN) 0.5 MG tablet, Take 1 tablet (0.5 mg total) by mouth every 6 (six) hours as needed for anxiety. AS NEEDED FOR NAUSEA, Disp: 45 tablet, Rfl: 0   oxycodone (OXY-IR) 5 MG capsule, Take 1-2 capsules (5-10 mg total) by mouth every 4 (four) hours as needed., Disp: 90 capsule, Rfl: 0   oxyCODONE (OXYCONTIN) 10 mg 12 hr tablet, Take 1 tablet (10 mg total) by mouth every 12 (twelve) hours., Disp: 60 tablet, Rfl: 0   prochlorperazine (COMPAZINE) 10 MG tablet, Take 1 tablet (10 mg total) by mouth every 6 (six)  hours as needed for nausea or vomiting. ([name] not taking: Reported on 02/18/2021), Disp: 12 tablet, Rfl: 0 No current facility-administered medications for this visit.  Facility-Administered Medications Ordered in Other Visits:    0.9 %  sodium chloride infusion, , Intravenous, Continuous, Burns, Wandra Feinstein, NP, Stopped at 06/02/20 1453   heparin lock flush 100 unit/mL, 500 Units, Intravenous, Once, Sophia Guadeloupe, MD   heparin lock flush 100 unit/mL, 500 Units, Intravenous, Once, Sophia Guadeloupe, MD   sodium chloride flush (NS) 0.9 % injection 10 mL, 10 mL, Intravenous, Once, Sophia Guadeloupe, MD   sodium chloride flush (NS) 0.9 % injection 10 mL, 10 mL, Intravenous,  Once, Sophia Guadeloupe, MD  Physical exam:  Vitals:   03/22/21 1429  BP: 114/80  Pulse: (!) 117  Resp: 18  SpO2: 98%   Physical Exam Constitutional:      General: Sophia [name] is not in acute distress.    Comments: Chronically ill appearing  Cardiovascular:     Rate and Rhythm: Normal rate and regular rhythm.     Heart sounds: Normal heart sounds.  Pulmonary:     Effort: Pulmonary effort is normal.     Breath sounds: Normal breath sounds.  Musculoskeletal:        General: No deformity.     Comments: wheelchair  Skin:    General: Skin is warm and dry.     Findings: Rash (acneiform rash on face; hyperpigmentation of scalp.) present.  Neurological:     Mental Status: Sophia [name] is alert and oriented to person, place, and time.  Psychiatric:        Mood and Affect: Mood normal.        Behavior: Behavior normal.     CMP Latest Ref Rng & Units 03/14/2021  Glucose 70 - 99 mg/dL 108(H)  BUN 6 - 20 mg/dL 16  Creatinine 0.44 - 1.00 mg/dL 0.61  Sodium 135 - 145 mmol/L 135  Potassium 3.5 - 5.1 mmol/L 3.6  Chloride 98 - 111 mmol/L 103  CO2 22 - 32 mmol/L 24  Calcium 8.9 - 10.3 mg/dL 9.1  Total Protein 6.5 - 8.1 g/dL 7.0  Total Bilirubin 0.3 - 1.2 mg/dL 0.7  Alkaline Phos 38 - 126 U/L 73  AST 15 - 41 U/L 23  ALT 0 - 44 U/L 24    CBC Latest Ref Rng & Units 03/15/2021  WBC 4.0 - 10.5 K/uL 6.8  Hemoglobin 12.0 - 15.0 g/dL 12.2  Hematocrit 36.0 - 46.0 % 36.4  Platelets 150 - 400 K/uL 280    No images are attached to Sophia encounter.  CT ABDOMEN PELVIS W CONTRAST  Result Date: 03/14/2021 CLINICAL DATA:  Abdominal pain.  Metastatic breast cancer. EXAM: CT ABDOMEN AND PELVIS WITH CONTRAST TECHNIQUE: Multidetector CT imaging of Sophia abdomen and pelvis was performed using Sophia standard protocol following bolus administration of intravenous contrast. CONTRAST:  140mL OMNIPAQUE IOHEXOL 300 MG/ML  SOLN COMPARISON:  CT dated 01/19/2021. FINDINGS: Lower chest: An area of atelectasis or scarring in Sophia right lower lobe medially. Sophia visualized lung bases are otherwise clear. Partially visualized central venous line in Sophia right atrium. No intra-abdominal free air or free fluid. Hepatobiliary: No focal liver abnormality is seen. No gallstones, gallbladder wall thickening, or biliary dilatation. Pancreas: Unremarkable. No pancreatic ductal dilatation or surrounding inflammatory changes. Spleen: Normal in size without focal abnormality. Adrenals/Urinary Tract: Sophia adrenal glands unremarkable. Sophia kidneys, visualized ureters, and urinary bladder appear unremarkable. Stomach/Bowel: There is no bowel obstruction or active inflammation. Sophia appendix is normal. Vascular/Lymphatic: Sophia abdominal aorta and IVC are unremarkable. No portal venous gas. There is no adenopathy. Reproductive: Sophia uterus and ovaries are grossly unremarkable. No pelvic masses Other: None Musculoskeletal: L1 and L4 compression fractures with prior vertebroplasty changes similar to prior CT. Several scattered sclerotic lesions in Sophia spine and an area of sclerotic change involving Sophia superior endplate of L2 similar to prior CT. There is retropulsion of posterior cortex of L1 with moderate focal narrowing of Sophia central canal similar to prior CT. Status post prior internal  fixation of right femoral neck fracture. There is vertebroplasty changes of Sophia right sacral al a and iliac  bone. No acute osseous pathology. IMPRESSION: 1. No acute intra-abdominal or pelvic pathology. No bowel obstruction. Normal appendix. 2. Unchanged L1 and L4 compression fractures with prior vertebroplasty changes. 3. Similar osseous sclerotic metastatic disease. Electronically Signed   By: Anner Crete M.D.   On: 03/14/2021 22:47   US Venous Img Lower Bilateral (DVT)  Result Date: 03/15/2021 EXAM: BILATERAL LOWER EXTREMITY VENOUS DOPPLER ULTRASOUND TECHNIQUE: Gray-scale sonography with graded compression, as well as color Doppler and duplex ultrasound were performed to evaluate Sophia lower extremity deep venous systems from Sophia level of Sophia common femoral vein and including Sophia common femoral, femoral, profunda femoral, popliteal and calf veins including Sophia posterior tibial, peroneal and gastrocnemius veins when visible. Sophia superficial great saphenous vein was also interrogated. Spectral Doppler was utilized to evaluate flow at rest and with distal augmentation maneuvers in Sophia common femoral, femoral and popliteal veins. COMPARISON:  None. FINDINGS: RIGHT LOWER EXTREMITY Common Femoral Vein: No evidence of thrombus. Normal compressibility, respiratory phasicity and response to augmentation. Saphenofemoral Junction: No evidence of thrombus. Normal compressibility and flow on color Doppler imaging. Profunda Femoral Vein: No evidence of thrombus. Normal compressibility and flow on color Doppler imaging. Femoral Vein: No evidence of thrombus. Normal compressibility, respiratory phasicity and response to augmentation. Popliteal Vein: No evidence of thrombus. Normal compressibility, respiratory phasicity and response to augmentation. Calf Veins: No evidence of thrombus. Normal compressibility and flow on color Doppler imaging. LEFT LOWER EXTREMITY Common Femoral Vein: No evidence of thrombus. Normal  compressibility, respiratory phasicity and response to augmentation. Saphenofemoral Junction: No evidence of thrombus. Normal compressibility and flow on color Doppler imaging. Profunda Femoral Vein: No evidence of thrombus. Normal compressibility and flow on color Doppler imaging. Femoral Vein: No evidence of thrombus. Normal compressibility, respiratory phasicity and response to augmentation. Popliteal Vein: No evidence of thrombus. Normal compressibility, respiratory phasicity and response to augmentation. Calf Veins: No evidence of thrombus. Normal compressibility and flow on color Doppler imaging. Other Findings: Bilateral Baker's cysts identified in popliteal fossas, measuring 2.4 x 0.9 x 1.7 cm on Sophia right, and 2.0 x 0.6 x 1.4 cm on Sophia left. IMPRESSION: No evidence of deep venous thrombosis in either lower extremity. Electronically Signed   By: Albin Felling M.D.   On: 03/15/2021 10:46   DG Femur Min 2 Views Right  Result Date: 03/14/2021 CLINICAL DATA:  Fall, pain EXAM: RIGHT FEMUR 2 VIEWS COMPARISON:  01/21/2021 FINDINGS: Evidence of prior trauma and internal fixation. There is an acute nondisplaced fracture through Sophia lateral cortex in Sophia proximal to mid shaft of Sophia right femur. No displacement. No subluxation or dislocation. IMPRESSION: Lateral cortical periprosthetic acute fracture adjacent to Sophia IM nail in Sophia proximal to mid right femur. Electronically Signed   By: Rolm Baptise M.D.   On: 03/14/2021 18:39     Assessment and plan- [name] is a 41 y.o. female with ER negative HER2 positive metastatic breast cancer with bone metastases now with brain metastases who presents to clinic for hospital follow up  Non displaced closed fracture of right femur- touch down weight bearing per ortho. No surgery recommended. Sophia [name] has follow up with ortho scheduled for 03/29/21 at [facility]. Reviewed ortho recommendations.  Brain metastases- continue decadron as prescribed. Taper per Dr. Baruch Gouty.   Palliative care- follow up with palliative care as scheduled on 03/29/21.  Acneiform rash- continue topicals per Dr. Janese Banks Hyperpigmentation of scalp/radiation dermatitis- likely radiation associated. Continue topical aquaphor.  Neoplasm related pain with acute pain related to femur  fracture- continue oxycontin and oxycodone as prescribed. Continue cymbalta and neurontin.  Anxiety- will refill ativan today.  Metastatic breast cancer- Sophia [name] missed appointment for herceptin and perjeta d/t hospitalization. Plan for her to rtc on 1/13 for evaluation and possible treatment with Dr. Janese Banks. We reviewed inpatient CT abdomen pelvis which did not show any evidence of malignancy. Bone disease was stable.   RTC in interim as needed. Follow up with Dr. Janese Banks as scheduled.   Due to a language barrier, a spanish language interpreter was utilized for all [name] interactions.    Visit Diagnosis 1. Closed fracture of distal end of right femur with routine healing, unspecified fracture morphology, subsequent encounter   2. Metastatic breast cancer ([place])   3. Brain metastases ([place])   4. Acneiform rash   5. Radiation dermatitis   6. Anxiety associated with cancer diagnosis ([place])   7. Cancer associated pain    Beckey Rutter, DNP, AGNP-C [place] at [facility] [phone] (clinic) 03/22/2021

## 2021-03-22 NOTE — Telephone Encounter (Signed)
Transition Care Management Unsuccessful Follow-up Telephone Call ° °Date of discharge and from where:  03/16/2021  [facility] ° °Attempts:  1st Attempt ° °Reason for unsuccessful TCM follow-up call:  No answer/busy ° °Katty Fretwell, RN, BSN, CEN °[facility] Community Care Coordinator °[phone]  °  °

## 2021-03-23 ENCOUNTER — Encounter: Payer: Self-pay | Admitting: Oncology

## 2021-03-23 ENCOUNTER — Encounter: Payer: Self-pay | Admitting: Licensed Clinical Social Worker

## 2021-03-23 MED ORDER — LORAZEPAM 0.5 MG PO TABS: 0.5000 mg | ORAL_TABLET | Freq: Four times a day (QID) | ORAL | 0 refills | Status: DC | PRN

## 2021-03-23 NOTE — Progress Notes (Signed)
[place] Work  Initial Assessment   Sophia [name] is a 41 y.o. year old female contacted by phone. Clinical Social Work was referred by Magdalene Patricia RN for assessment of psychosocial needs.   SDOH (Social Determinants of Health) assessments performed: No   Distress Screen completed: No ONCBCN DISTRESS SCREENING 08/22/2019  Information Concerns Type Lack of info about diagnosis;Lack of info about treatment      Family/Social Information:  Housing Arrangement: patient lives with three minor children , ages 9, 87 and 86 Family members/support persons in your life? Friends/Colleagues   Transportation concerns: no  Employment: Agricultural engineer. Income source: No income Financial concerns: Yes, current concerns Type of concern: Utilities, Rent/ mortgage, Phone, Medical bills, and Food Food access concerns: yes, but not emergent Religious or spiritual practice: yes Medication Concerns: no  Services Currently in place:  N/A  Coping/ Adjustment to diagnosis: Patient understands treatment plan and what happens next? Yes, but seems not to fully grasp the severity of her medical prognosis.  Patient seems to be in denial of any outcome outside of improving. Concerns about diagnosis and/or treatment: How I will care for other members of my family Patient reported stressors: Housing, Publishing rights manager, Transport planner, Haematologist, and Recruitment consultant and priorities: Patient has an open believe in God and referenced God's intervention in her life. Patient enjoys time with family/ friends Current coping skills/ strengths: Average or above average intelligence , Capable of independent living , Motivation for treatment/growth , Religious Affiliation , and Supportive family/friends     SUMMARY: Current SDOH Barriers:  Financial constraints related to unemployment, Limited social support, Mental Health Concerns , Limited education about , patient has limited understanding of treatment*, Lacks knowledge of  community resource: has only two friends who can assist her, and patient does not have family in the area.  Clinical Social Work Clinical Goal(s):  explore community resource options for unmet needs related to:  [employer] , Food Insecurity , and Social Connections Patient and CSW agreed to meet during next [facility] appointment on 04/01/2021. Patient agreed to contact social worker if she wanted to discuss concerns or questions in reference to SDOH.  Interventions: Discussed common feeling and emotions when being diagnosed with cancer, and the importance of support during treatment Informed patient of the support team roles and support services at [facility] Provided CSW contact information and encouraged patient to call with any questions or concerns Provided patient with contact information for follow-up.   Follow Up Plan: CSW will see patient on 04/01/2021  and will follow-up via telephone. Patient verbalizes understanding of plan: Yes   [employer] , LCSW

## 2021-03-23 NOTE — Progress Notes (Signed)
Patient last got herceptin/perjeta on 12/2 d/t hospitalization.  Per MD reload herceptin for 04/01/21 appointment.

## 2021-03-24 ENCOUNTER — Telehealth: Payer: Self-pay

## 2021-03-24 NOTE — Telephone Encounter (Signed)
Transition Care Management Follow-up Telephone Call Date of discharge and from where: 03/16/2021  [facility] How have you been since you were released from the hospital? " Doing ok" Any questions or concerns? No  Items Reviewed: Did the pt receive and understand the discharge instructions provided? Yes  Medications obtained and verified? Yes  Other? No  Any new allergies since your discharge? No  Dietary orders reviewed? No Do you have support at home?  unknown  Home Care and Equipment/Supplies: Were home health services ordered? not applicable If so, what is the name of the agency?  Has the agency set up a time to come to the patient's home?  Were any new equipment or medical supplies ordered?   What is the name of the medical supply agency?  Were you able to get the supplies/equipment?  Do you have any questions related to the use of the equipment or supplies?   Functional Questionnaire: (I = Independent and D = Dependent) ADLs: reports she is doing the best that she can  Bathing/Dressing-  reports she is doing the best that she can  Meal Prep-  reports she is doing the best that she can  Eating-  reports she is doing the best that she can  Maintaining continence-  reports she is doing the best that she can  Transferring/Ambulation-  reports she is doing the best that she can  Managing Meds-  reports she is doing the best that she can  Follow up appointments reviewed:  PCP Hospital f/u appt confirmed? Yes  Already completed [facility] f/u appt confirmed? Yes   Are transportation arrangements needed? no If their condition worsens, is the pt aware to call PCP or go to the Emergency Dept.? Yes Was the patient provided with contact information for the PCP's office or ED? Yes Was to pt encouraged to call back with questions or concerns? Yes  Tomasa Rand, RN, BSN, CEN [facility] [employer] [phone]

## 2021-03-29 ENCOUNTER — Telehealth: Payer: Self-pay | Admitting: *Deleted

## 2021-03-29 ENCOUNTER — Inpatient Hospital Stay (HOSPITAL_BASED_OUTPATIENT_CLINIC_OR_DEPARTMENT_OTHER): Payer: Self-pay | Admitting: Hospice and Palliative Medicine

## 2021-03-29 DIAGNOSIS — Z515 Encounter for palliative care: Secondary | ICD-10-CM

## 2021-03-29 DIAGNOSIS — C7931 Secondary malignant neoplasm of brain: Secondary | ICD-10-CM

## 2021-03-29 DIAGNOSIS — C7951 Secondary malignant neoplasm of bone: Secondary | ICD-10-CM

## 2021-03-29 DIAGNOSIS — C50919 Malignant neoplasm of unspecified site of unspecified female breast: Secondary | ICD-10-CM

## 2021-03-29 DIAGNOSIS — F419 Anxiety disorder, unspecified: Secondary | ICD-10-CM

## 2021-03-29 MED ORDER — GABAPENTIN 600 MG PO TABS
600.0000 mg | ORAL_TABLET | Freq: Three times a day (TID) | ORAL | 3 refills | Status: DC
  Filled 2021-06-06: qty 90, 30d supply, fill #0
  Filled 2021-07-29: qty 90, 30d supply, fill #1
  Filled 2021-09-14: qty 90, 30d supply, fill #2

## 2021-03-29 MED ORDER — LORAZEPAM 0.5 MG PO TABS: 0.5000 mg | ORAL_TABLET | Freq: Four times a day (QID) | ORAL | 0 refills | Status: DC | PRN

## 2021-03-29 NOTE — Progress Notes (Signed)
Virtual Visit via Telephone Note  I connected with [name] on 03/29/21 at 11:30 AM EST by telephone and verified that I am speaking with the correct person using two identifiers.  Location: Patient: Home Provider: Clinic   I discussed the limitations, risks, security and privacy concerns of performing an evaluation and management service by telephone and the availability of in person appointments. I also discussed with the patient that there may be a patient responsible charge related to this service. The patient expressed understanding and agreed to proceed.   History of Present Illness: Sophia [name] is a 41 y.o. 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    MRI of the brain on 02/08/2021 revealed widespread metastatic disease to the brain with mild associated edema and small area of hemorrhage in the right frontal.   Observations/Objective: Patient was recently hospitalized 03/14/2021-03/16/2021 with right femur fracture requiring orthopedic repair.  I called and spoke with patient by phone.  She reports that she is doing "better" and denies any significant changes or concerns.  She had no symptomatic complaints today.  She reports stable appetite.  She is being followed by [facility].  Patient reports stable anxiety and requested refill of her lorazepam and gabapentin.  Assessment and Plan: Stage IV breast cancer metastatic to bone and brain -stable symptomatically.  Patient has follow-up with Dr. Janese Banks scheduled on 04/01/2020 for consideration of Herceptin/Perjeta  Recent right femur fracture -followed by [facility]  Anxiety -stable on lorazepam.  Refill her lorazepam #45. PDMP reviewed  Follow Up Instructions: Follow-up telephone visit 1 month   I discussed the assessment and treatment plan with the patient. The patient was provided an opportunity to ask questions and all  were answered. The patient agreed with the plan and demonstrated an understanding of the instructions.   The patient was advised to call back or seek an in-person evaluation if the symptoms worsen or if the condition fails to improve as anticipated.  I provided 5 minutes of non-face-to-face time during this encounter.   Irean Hong, NP

## 2021-03-29 NOTE — Telephone Encounter (Signed)
Pt had an appt at [facility] today and uber sent her to [facility] clinic instead. Then uber left and then pt found out it was wrong place and we called uber to come back and get her. Then it was 1 hour from [facility] appt time at [facility]. I called over there and explained that she was driven to wrong place. They made a new appt at 1/16 10:00. I sent test to pt with the new appt and time and she sent me a message back that she understands and we set up for the uber again for transportation

## 2021-04-01 ENCOUNTER — Inpatient Hospital Stay (HOSPITAL_BASED_OUTPATIENT_CLINIC_OR_DEPARTMENT_OTHER): Payer: Self-pay | Admitting: Oncology

## 2021-04-01 ENCOUNTER — Inpatient Hospital Stay: Payer: Self-pay

## 2021-04-01 ENCOUNTER — Encounter: Payer: Self-pay | Admitting: Oncology

## 2021-04-01 ENCOUNTER — Other Ambulatory Visit: Payer: Self-pay

## 2021-04-01 ENCOUNTER — Ambulatory Visit
Admission: RE | Admit: 2021-04-01 | Discharge: 2021-04-01 | Disposition: A | Discharge status: Home / Self Care | Payer: Self-pay | Source: Ambulatory Visit | Attending: Radiation Oncology | Admitting: Radiation Oncology

## 2021-04-01 VITALS — BP 116/82 | HR 80 | Temp 97.2°F | Resp 16 | Ht 62.0 in | Wt 145.0 lb

## 2021-04-01 VITALS — BP 123/81 | HR 75 | Temp 97.0°F | Resp 17

## 2021-04-01 DIAGNOSIS — C7951 Secondary malignant neoplasm of bone: Secondary | ICD-10-CM | POA: Insufficient documentation

## 2021-04-01 DIAGNOSIS — Z171 Estrogen receptor negative status [ER-]: Secondary | ICD-10-CM | POA: Insufficient documentation

## 2021-04-01 DIAGNOSIS — C7931 Secondary malignant neoplasm of brain: Secondary | ICD-10-CM | POA: Insufficient documentation

## 2021-04-01 DIAGNOSIS — C50919 Malignant neoplasm of unspecified site of unspecified female breast: Secondary | ICD-10-CM

## 2021-04-01 DIAGNOSIS — Z5112 Encounter for antineoplastic immunotherapy: Secondary | ICD-10-CM

## 2021-04-01 DIAGNOSIS — M8440XA Pathological fracture, unspecified site, initial encounter for fracture: Secondary | ICD-10-CM

## 2021-04-01 DIAGNOSIS — Z923 Personal history of irradiation: Secondary | ICD-10-CM | POA: Insufficient documentation

## 2021-04-01 DIAGNOSIS — C50911 Malignant neoplasm of unspecified site of right female breast: Secondary | ICD-10-CM | POA: Insufficient documentation

## 2021-04-01 LAB — CBC WITH DIFFERENTIAL/PLATELET
Abs Immature Granulocytes: 0.3 10*3/uL — ABNORMAL HIGH (ref 0.00–0.07)
Basophils Absolute: 0.1 10*3/uL (ref 0.0–0.1)
Basophils Relative: 1 %
Eosinophils Absolute: 0 10*3/uL (ref 0.0–0.5)
Eosinophils Relative: 0 %
HCT: 38.8 % (ref 36.0–46.0)
Hemoglobin: 13.1 g/dL (ref 12.0–15.0)
Immature Granulocytes: 3 %
Lymphocytes Relative: 10 %
Lymphs Abs: 1 10*3/uL (ref 0.7–4.0)
MCH: 29.8 pg (ref 26.0–34.0)
MCHC: 33.8 g/dL (ref 30.0–36.0)
MCV: 88.4 fL (ref 80.0–100.0)
Monocytes Absolute: 0.5 10*3/uL (ref 0.1–1.0)
Monocytes Relative: 6 %
Neutro Abs: 7.5 10*3/uL (ref 1.7–7.7)
Neutrophils Relative %: 80 %
Platelets: 355 10*3/uL (ref 150–400)
RBC: 4.39 MIL/uL (ref 3.87–5.11)
RDW: 15.1 % (ref 11.5–15.5)
WBC: 9.4 10*3/uL (ref 4.0–10.5)
nRBC: 0 % (ref 0.0–0.2)

## 2021-04-01 LAB — COMPREHENSIVE METABOLIC PANEL
ALT: 17 U/L (ref 0–44)
AST: 19 U/L (ref 15–41)
Albumin: 3.6 g/dL (ref 3.5–5.0)
Alkaline Phosphatase: 87 U/L (ref 38–126)
Anion gap: 6 (ref 5–15)
BUN: 13 mg/dL (ref 6–20)
CO2: 23 mmol/L (ref 22–32)
Calcium: 9 mg/dL (ref 8.9–10.3)
Chloride: 103 mmol/L (ref 98–111)
Creatinine, Ser: 0.39 mg/dL — ABNORMAL LOW (ref 0.44–1.00)
GFR, Estimated: >60 mL/min (ref >60)
Glucose, Bld: 126 mg/dL — ABNORMAL HIGH (ref 70–99)
Potassium: 3 mmol/L — ABNORMAL LOW (ref 3.5–5.1)
Sodium: 132 mmol/L — ABNORMAL LOW (ref 135–145)
Total Bilirubin: 0.6 mg/dL (ref 0.3–1.2)
Total Protein: 6.8 g/dL (ref 6.5–8.1)

## 2021-04-01 MED ORDER — DIPHENHYDRAMINE HCL 50 MG/ML IJ SOLN
50.0000 mg | Freq: Once | INTRAMUSCULAR | Status: AC
  Administered 2021-04-01: 50 mg via INTRAVENOUS
  Filled 2021-04-01: qty 1

## 2021-04-01 MED ORDER — TRASTUZUMAB-ANNS CHEMO 150 MG IV SOLR
450.0000 mg | Freq: Once | INTRAVENOUS | Status: AC
  Administered 2021-04-01: 450 mg via INTRAVENOUS
  Filled 2021-04-01: qty 21.43

## 2021-04-01 MED ORDER — TRASTUZUMAB-ANNS CHEMO 150 MG IV SOLR: 8.0000 mg/kg | Freq: Once | INTRAVENOUS | Status: DC

## 2021-04-01 MED ORDER — SODIUM CHLORIDE 0.9 % IV SOLN
420.0000 mg | Freq: Once | INTRAVENOUS | Status: AC
  Administered 2021-04-01: 420 mg via INTRAVENOUS
  Filled 2021-04-01: qty 14

## 2021-04-01 MED ORDER — ACETAMINOPHEN 325 MG PO TABS
650.0000 mg | ORAL_TABLET | Freq: Once | ORAL | Status: AC
  Administered 2021-04-01: 650 mg via ORAL
  Filled 2021-04-01: qty 2

## 2021-04-01 MED ORDER — HEPARIN SOD (PORK) LOCK FLUSH 100 UNIT/ML IV SOLN
500.0000 [IU] | Freq: Once | INTRAVENOUS | Status: AC | PRN
  Administered 2021-04-01: 500 [IU]
  Filled 2021-04-01: qty 5

## 2021-04-01 MED ORDER — SODIUM CHLORIDE 0.9 % IV SOLN
Freq: Once | INTRAVENOUS | Status: AC
  Filled 2021-04-01: qty 250

## 2021-04-01 NOTE — Progress Notes (Signed)
Radiation Oncology Follow up Note  Name: Sophia [name]   Date:   04/01/2021 MRN:  [id number] DOB: 1980/10/18    This 41 y.o. female presents to the clinic today for 1 month follow-up status post whole brain radiation therapy for brain metastasis and patient with known stage IV breast cancer.  REFERRING PROVIDER: Sindy Guadeloupe, MD  HPI: Patient is a 41 year old female now 1 month out having pleated whole brain radiation therapy for multiple brain metastasis and patient with known stage IV breast cancer.  She is also received palliative radiation therapy to her bone mets in the past.  She continues on narcotic analgesics she recently fell and sustained a fracture of her right hip.  She is currently receiving Herceptin and Perjeta.  She specifically denies any change in visual fields any focal neurologic deficits or any change in mental status.  COMPLICATIONS OF TREATMENT: none  FOLLOW UP COMPLIANCE: keeps appointments   PHYSICAL EXAM:  There were no vitals taken for this visit. Frail-appearing female in NAD.  Wheelchair-bound.  She does have some unusual hyperpigmentation of her scalp from the radiation.  Crude visual fields were normal range motor or sensory in detail levels are equal and symmetric in upper lower extremities.  Well-developed well-nourished patient in NAD. HEENT reveals PERLA, EOMI, discs not visualized.  Oral cavity is clear. No oral mucosal lesions are identified. Neck is clear without evidence of cervical or supraclavicular adenopathy. Lungs are clear to A&P. Cardiac examination is essentially unremarkable with regular rate and rhythm without murmur rub or thrill. Abdomen is benign with no organomegaly or masses noted. Motor sensory and DTR levels are equal and symmetric in the upper and lower extremities. Cranial nerves II through XII are grossly intact. Proprioception is intact. No peripheral adenopathy or edema is identified. No motor or sensory levels are noted. Crude  visual fields are within normal range.  RADIOLOGY RESULTS: No current films for review  PLAN: Present time I will turn follow-up care over to medical oncology.  I be happy to reevaluate the patient in time should further palliative treatment be indicated.  She continues on Herceptin and Perjeta treatments.  Patient is to call with any concerns at any time.  I would like to take this opportunity to thank you for allowing me to participate in the care of your patient.Noreene Filbert, MD

## 2021-04-01 NOTE — Progress Notes (Signed)
Pt using oxycontin and when taking 5 mg oxycodone she says it does not make her sleepy but she felt off. Does not cause her dizziness, does not cause HAbut yet she feel weird. Constipation at times and she was given miralax and can't afford it and calcium and vit d and that she can't afford. I will check with Juliann Pulse about asst

## 2021-04-01 NOTE — Progress Notes (Signed)
Hematology/Oncology Consult note [facility]  Telephone:([phone] [phone]  Patient Care Team: Creig Hines, MD as PCP - General (Oncology) Jim Like, RN as Registered Nurse Scarlett Presto, RN as Registered Nurse Creig Hines, MD as Consulting Physician (Hematology and Oncology)   Name of the patient: Sophia [name]  [id number]  1980/10/10   Date of visit: 04/01/21  Diagnosis- stage IV metastatic breast cancer ER/PR negative HER-2/neu positive with bone metastases  Chief complaint/ Reason for visit-on treatment assessment prior to next cycle of Herceptin and Perjeta and post hospital discharge follow-up  Heme/Onc history: patient is a 41 year old Hispanic female who is here with her friend.  History obtained with the help of an interpreter.Patient self palpated left breast mass which was followed by a diagnostic bilateral mammogram.  Mammogram showed 3.1 x 2.9 x 1.9 cm hypoechoic mass at the 1 o'clock position of the left breast.  For abnormal cortically thickened left axillary lymph nodes measuring up to 5 mm.  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  Patient was also having ongoing back pain and was seen by [facility] orthopedics Dr. Izola Price who ordered MRI lumbar spine without contrast which showed possible pathologic fractures of L1 and L4 vertebral bodies with greater than 50% height loss at L1 and abnormal signal involving L2-L3 S1 as well as right iliac bone concerning for metastatic disease.  Patient is a single mother of 3 adult children and is very anxious today.  She reports significant back pain which radiates to her bilateral thighs.  Denies any focal tingling numbness or weakness.  Denies any bowel bladder incontinence.  Pain has been uncontrolled despite taking Tylenol.  No prior history of abnormal breast  biopsies.  No family history of breast cancer   PET and MRI showed 3 areas of pathologic fracture of her spine as well as widespread bony metastatic disease and concern for impending fracture of the right hip.  Given her worsening pain she was asked to come to the ER.  She has been evaluated by Dr. Rosita Kea from orthopedic surgery and underwent kyphoplasty at 3 different levels. T6 L1 and L4 along with radiofrequency ablation.    She also underwent prophylactic fixation of the right hip and not affected the sacral region.     Patient received first dose of Herceptin and Perjeta on 09/04/2019.  Patient initially received Taxol and Herceptin perjeta.  Presently patient is on maintenance Herceptin and Perjeta alone.      Interval history-patient is currently working with physical therapyFollowing hip fracture.  She had a fall in December 2022 fracture proximal to mid right femur shaft near the intramedullary nail.  Orthopedics was consulted but did not recommend any surgical management.  Currently patient not bearing weight on her right leg but is able to get herself.  She uses a wheelchair.  Pain is presently well controlled with pain medications.  Reports occasional headache  ECOG PS- 2 Pain scale- 0 Opioid associated constipation- no  Review of systems- Review of Systems  Constitutional:  Positive for malaise/fatigue. Negative for chills, fever and weight loss.  HENT:  Negative for congestion, ear discharge and nosebleeds.   Eyes:  Negative for blurred vision.  Respiratory:  Negative for cough, hemoptysis, sputum production, shortness of breath and wheezing.   Cardiovascular:  Negative for chest pain, palpitations, orthopnea  and claudication.  Gastrointestinal:  Negative for abdominal pain, blood in stool, constipation, diarrhea, heartburn, melena, nausea and vomiting.  Genitourinary:  Negative for dysuria, flank pain, frequency, hematuria and urgency.  Musculoskeletal:  Negative for back pain,  joint pain and myalgias.       Right thigh pain  Skin:  Negative for rash.  Neurological:  Negative for dizziness, tingling, focal weakness, seizures, weakness and headaches.  Endo/Heme/Allergies:  Does not bruise/bleed easily.  Psychiatric/Behavioral:  Negative for depression and suicidal ideas. The patient does not have insomnia.      No Known Allergies   Past Medical History:  Diagnosis Date   Anxiety    Breast cancer (HCC)    with mets   Cancer (HCC)    Colitis    COVID-19 in immunocompromised patient (HCC)    Family history of colon cancer    Vertigo      Past Surgical History:  Procedure Laterality Date   BREAST BIOPSY Left 08/14/2019   [place] bx of mass, coil marker, path pending   BREAST BIOPSY Left 08/14/2019   [place] bx of LN, hydromarker, path pending   BREAST BIOPSY Left 08/14/2019   affirm bx of calcs, x marker, path pending   ESOPHAGOGASTRODUODENOSCOPY (EGD) WITH PROPOFOL N/A 10/05/2019   Procedure: ESOPHAGOGASTRODUODENOSCOPY (EGD) WITH PROPOFOL;  Surgeon: Toney Reil, MD;  Location: [facility] ENDOSCOPY;  Service: Gastroenterology;  Laterality: N/A;   FLEXIBLE SIGMOIDOSCOPY N/A 10/05/2019   Procedure: FLEXIBLE SIGMOIDOSCOPY;  Surgeon: Toney Reil, MD;  Location: [facility] ENDOSCOPY;  Service: Gastroenterology;  Laterality: N/A;   INTRAMEDULLARY (IM) NAIL INTERTROCHANTERIC Right 09/01/2019   Procedure: INTRAMEDULLARY (IM) NAIL INTERTROCHANTRIC AND RADIOFREQUENCY ABLATION;  Surgeon: Kennedy Bucker, MD;  Location: [facility] ORS;  Service: Orthopedics;  Laterality: Right;   KYPHOPLASTY N/A 08/29/2019   Procedure: KYPHOPLASTY T6, L1,L4 ,  RADIOFREQUENCY ABLATION;  Surgeon: Kennedy Bucker, MD;  Location: [facility] ORS;  Service: Orthopedics;  Laterality: N/A;   KYPHOPLASTY Right 09/01/2019   Procedure: Right Sacral Radiofrequency Ablation and Cement Augmentation, Right sacrum and iliac crest;  Surgeon: Kennedy Bucker, MD;  Location: [facility] ORS;  Service: Orthopedics;  Laterality: Right;    PORTA CATH INSERTION N/A 08/28/2019   Procedure: PORTA CATH INSERTION;  Surgeon: Annice Needy, MD;  Location: [facility] INVASIVE CV LAB;  Service: Cardiovascular;  Laterality: N/A;    Social History   Socioeconomic History   Marital status: Single    Spouse name: Not on file   Number of children: Not on file   Years of education: Not on file   Highest education level: Not on file  Occupational History   Not on file  Tobacco Use   Smoking status: Never   Smokeless tobacco: Never  Vaping Use   Vaping Use: Never used  Substance and Sexual Activity   Alcohol use: Not Currently   Drug use: Not Currently   Sexual activity: Not Currently    Birth control/protection: None  Other Topics Concern   Not on file  Social History Narrative   Lives at home with children   Social Determinants of Health   Financial Resource Strain: Not on file  Food Insecurity: Not on file  Transportation Needs: Not on file  Physical Activity: Not on file  Stress: Not on file  Social Connections: Not on file  Intimate Partner Violence: Not on file    Family History  Problem Relation Age of Onset   Colon cancer Maternal Uncle      Current Outpatient Medications:  dexamethasone (DECADRON) 4 MG tablet, Take 1 tablet (4 mg total) by mouth 2 (two) times daily with a meal., Disp: 60 tablet, Rfl: 0   DULoxetine (CYMBALTA) 30 MG capsule, Take 1 capsule (30 mg total) by mouth daily., Disp: 30 capsule, Rfl: 3   gabapentin (NEURONTIN) 600 MG tablet, Take 1 tablet (600 mg total) by mouth 3 (three) times daily., Disp: 90 tablet, Rfl: 3   lidocaine-prilocaine (EMLA) cream, Apply 1 application topically as needed. Apply small amount to port site at least 1 hour prior to it being accessed, cover with plastic wrap, Disp: 30 g, Rfl: 1   LORazepam (ATIVAN) 0.5 MG tablet, Take 1 tablet (0.5 mg total) by mouth every 6 (six) hours as needed for anxiety. AS NEEDED FOR NAUSEA, Disp: 45 tablet, Rfl: 0   oxycodone (OXY-IR) 5 MG  capsule, Take 1-2 capsules (5-10 mg total) by mouth every 4 (four) hours as needed., Disp: 90 capsule, Rfl: 0   oxyCODONE (OXYCONTIN) 10 mg 12 hr tablet, Take 1 tablet (10 mg total) by mouth every 12 (twelve) hours., Disp: 60 tablet, Rfl: 0   albuterol (VENTOLIN HFA) 108 (90 Base) MCG/ACT inhaler, Inhale 2 puffs into the lungs every 6 (six) hours as needed for wheezing or shortness of breath. (Patient not taking: Reported on 11/19/2020), Disp: 8 g, Rfl: 2   calcium-vitamin D (OSCAL WITH D) 500-200 MG-UNIT tablet, Take 1 tablet by mouth 2 (two) times daily. (Patient not taking: Reported on 01/21/2021), Disp: 30 tablet, Rfl: 0   diphenoxylate-atropine (LOMOTIL) 2.5-0.025 MG tablet, Take 1 tablet by mouth 4 (four) times daily as needed for diarrhea or loose stools. (Patient not taking: Reported on 11/19/2020), Disp: 40 tablet, Rfl: 0   ergocalciferol (VITAMIN D2) 1.25 MG (50000 UT) capsule, Take 1 capsule (50,000 Units total) by mouth once a week. For 4 weeks. Then 1 capsule once monthly for 4 months (Patient not taking: Reported on 01/21/2021), Disp: 8 capsule, Rfl: 0   prochlorperazine (COMPAZINE) 10 MG tablet, Take 1 tablet (10 mg total) by mouth every 6 (six) hours as needed for nausea or vomiting. (Patient not taking: Reported on 02/18/2021), Disp: 12 tablet, Rfl: 0 No current facility-administered medications for this visit.  Facility-Administered Medications Ordered in Other Visits:    0.9 %  sodium chloride infusion, , Intravenous, Continuous, Burns, Jennifer E, NP, Stopped at 06/02/20 1453   heparin lock flush 100 unit/mL, 500 Units, Intravenous, Once, Sindy Guadeloupe, MD   heparin lock flush 100 unit/mL, 500 Units, Intravenous, Once, Sindy Guadeloupe, MD   heparin lock flush 100 unit/mL, 500 Units, Intracatheter, Once PRN, Sindy Guadeloupe, MD   pertuzumab (PERJETA) 420 mg in sodium chloride 0.9 % 250 mL chemo infusion, 420 mg, Intravenous, Once, Sindy Guadeloupe, MD, Last Rate: 528 mL/hr at 04/01/21 1233,  420 mg at 04/01/21 1233   sodium chloride flush (NS) 0.9 % injection 10 mL, 10 mL, Intravenous, Once, Sindy Guadeloupe, MD   sodium chloride flush (NS) 0.9 % injection 10 mL, 10 mL, Intravenous, Once, Sindy Guadeloupe, MD  Physical exam:  Vitals:   04/01/21 1049  BP: 116/82  Pulse: 80  Resp: 16  Temp: (!) 97.2 F (36.2 C)  TempSrc: Tympanic  Weight: 145 lb (65.8 kg)  Height: $Remove'5\' 2"'GCuSaAU$  (1.575 m)   Physical Exam Constitutional:      Comments: Sitting up in chair.  Appears in no acute distress  Cardiovascular:     Rate and Rhythm: Normal rate and regular  rhythm.     Heart sounds: Normal heart sounds.  Pulmonary:     Effort: Pulmonary effort is normal.     Breath sounds: Normal breath sounds.  Abdominal:     General: Bowel sounds are normal.     Palpations: Abdomen is soft.  Skin:    General: Skin is warm and dry.  Neurological:     Mental Status: She is alert and oriented to person, place, and time.     CMP Latest Ref Rng & Units 04/01/2021  Glucose 70 - 99 mg/dL 126(H)  BUN 6 - 20 mg/dL 13  Creatinine 0.44 - 1.00 mg/dL 0.39(L)  Sodium 135 - 145 mmol/L 132(L)  Potassium 3.5 - 5.1 mmol/L 3.0(L)  Chloride 98 - 111 mmol/L 103  CO2 22 - 32 mmol/L 23  Calcium 8.9 - 10.3 mg/dL 9.0  Total Protein 6.5 - 8.1 g/dL 6.8  Total Bilirubin 0.3 - 1.2 mg/dL 0.6  Alkaline Phos 38 - 126 U/L 87  AST 15 - 41 U/L 19  ALT 0 - 44 U/L 17   CBC Latest Ref Rng & Units 04/01/2021  WBC 4.0 - 10.5 K/uL 9.4  Hemoglobin 12.0 - 15.0 g/dL 13.1  Hematocrit 36.0 - 46.0 % 38.8  Platelets 150 - 400 K/uL 355    No images are attached to the encounter.  CT ABDOMEN PELVIS W CONTRAST  Result Date: 03/14/2021 CLINICAL DATA:  Abdominal pain.  Metastatic breast cancer. EXAM: CT ABDOMEN AND PELVIS WITH CONTRAST TECHNIQUE: Multidetector CT imaging of the abdomen and pelvis was performed using the standard protocol following bolus administration of intravenous contrast. CONTRAST:  140mL OMNIPAQUE IOHEXOL 300 MG/ML   SOLN COMPARISON:  CT dated 01/19/2021. FINDINGS: Lower chest: An area of atelectasis or scarring in the right lower lobe medially. The visualized lung bases are otherwise clear. Partially visualized central venous line in the right atrium. No intra-abdominal free air or free fluid. Hepatobiliary: No focal liver abnormality is seen. No gallstones, gallbladder wall thickening, or biliary dilatation. Pancreas: Unremarkable. No pancreatic ductal dilatation or surrounding inflammatory changes. Spleen: Normal in size without focal abnormality. Adrenals/Urinary Tract: The adrenal glands unremarkable. The kidneys, visualized ureters, and urinary bladder appear unremarkable. Stomach/Bowel: There is no bowel obstruction or active inflammation. The appendix is normal. Vascular/Lymphatic: The abdominal aorta and IVC are unremarkable. No portal venous gas. There is no adenopathy. Reproductive: The uterus and ovaries are grossly unremarkable. No pelvic masses Other: None Musculoskeletal: L1 and L4 compression fractures with prior vertebroplasty changes similar to prior CT. Several scattered sclerotic lesions in the spine and an area of sclerotic change involving the superior endplate of L2 similar to prior CT. There is retropulsion of posterior cortex of L1 with moderate focal narrowing of the central canal similar to prior CT. Status post prior internal fixation of right femoral neck fracture. There is vertebroplasty changes of the right sacral al a and iliac bone. No acute osseous pathology. IMPRESSION: 1. No acute intra-abdominal or pelvic pathology. No bowel obstruction. Normal appendix. 2. Unchanged L1 and L4 compression fractures with prior vertebroplasty changes. 3. Similar osseous sclerotic metastatic disease. Electronically Signed   By: Anner Crete M.D.   On: 03/14/2021 22:47   US Venous Img Lower Bilateral (DVT)  Result Date: 03/15/2021 EXAM: BILATERAL LOWER EXTREMITY VENOUS DOPPLER ULTRASOUND TECHNIQUE:  Gray-scale sonography with graded compression, as well as color Doppler and duplex ultrasound were performed to evaluate the lower extremity deep venous systems from the level of the common femoral vein and  including the common femoral, femoral, profunda femoral, popliteal and calf veins including the posterior tibial, peroneal and gastrocnemius veins when visible. The superficial great saphenous vein was also interrogated. Spectral Doppler was utilized to evaluate flow at rest and with distal augmentation maneuvers in the common femoral, femoral and popliteal veins. COMPARISON:  None. FINDINGS: RIGHT LOWER EXTREMITY Common Femoral Vein: No evidence of thrombus. Normal compressibility, respiratory phasicity and response to augmentation. Saphenofemoral Junction: No evidence of thrombus. Normal compressibility and flow on color Doppler imaging. Profunda Femoral Vein: No evidence of thrombus. Normal compressibility and flow on color Doppler imaging. Femoral Vein: No evidence of thrombus. Normal compressibility, respiratory phasicity and response to augmentation. Popliteal Vein: No evidence of thrombus. Normal compressibility, respiratory phasicity and response to augmentation. Calf Veins: No evidence of thrombus. Normal compressibility and flow on color Doppler imaging. LEFT LOWER EXTREMITY Common Femoral Vein: No evidence of thrombus. Normal compressibility, respiratory phasicity and response to augmentation. Saphenofemoral Junction: No evidence of thrombus. Normal compressibility and flow on color Doppler imaging. Profunda Femoral Vein: No evidence of thrombus. Normal compressibility and flow on color Doppler imaging. Femoral Vein: No evidence of thrombus. Normal compressibility, respiratory phasicity and response to augmentation. Popliteal Vein: No evidence of thrombus. Normal compressibility, respiratory phasicity and response to augmentation. Calf Veins: No evidence of thrombus. Normal compressibility and flow on  color Doppler imaging. Other Findings: Bilateral Baker's cysts identified in popliteal fossas, measuring 2.4 x 0.9 x 1.7 cm on the right, and 2.0 x 0.6 x 1.4 cm on the left. IMPRESSION: No evidence of deep venous thrombosis in either lower extremity. Electronically Signed   By: Albin Felling M.D.   On: 03/15/2021 10:46   DG Femur Min 2 Views Right  Result Date: 03/14/2021 CLINICAL DATA:  Fall, pain EXAM: RIGHT FEMUR 2 VIEWS COMPARISON:  01/21/2021 FINDINGS: Evidence of prior trauma and internal fixation. There is an acute nondisplaced fracture through the lateral cortex in the proximal to mid shaft of the right femur. No displacement. No subluxation or dislocation. IMPRESSION: Lateral cortical periprosthetic acute fracture adjacent to the IM nail in the proximal to mid right femur. Electronically Signed   By: Rolm Baptise M.D.   On: 03/14/2021 18:39     Assessment and plan- Patient is a 41 y.o. female with ER negative HER2 positive metastatic breast cancer with bone metastases. She was then found to have brain metastases s/p whole brain radiation treatment. She is here for on treatment assessment prior to next cycle of Herceptin and Perjeta  Counts okay to proceed with next cycle of Herceptin and Perjeta.  I will see her back in 3 weeks for the following cycle.  Plan to repeat CT chest and bone scan sometime next month.  She did recently have CT abdomen and pelvis with contrast in December 2022 which did not show any evidence of visceral metastases.  Right femur fracture: Not deemed to be operable at this point.  Patient will follow-up with orthopedics next week.  Repeat radiation was not considered possible.  Continue ongoing narcotic pain medications.  We will slowly start Xgeva with next cycle  I will check MRI brain for her brain metastases next month as well.  She is s/p whole brain radiation treatment   Visit Diagnosis 1. Metastatic breast cancer ([place])   2. Encounter for monoclonal antibody  treatment for malignancy      Dr. Randa Evens, MD, MPH [facility] [phone] 04/01/2021 12:41 PM

## 2021-04-03 ENCOUNTER — Other Ambulatory Visit: Payer: Self-pay | Admitting: *Deleted

## 2021-04-03 MED ORDER — OXYCODONE HCL ER 10 MG PO T12A: 10.0000 mg | EXTENDED_RELEASE_TABLET | Freq: Two times a day (BID) | ORAL | 0 refills | Status: DC

## 2021-04-05 ENCOUNTER — Other Ambulatory Visit: Payer: Self-pay | Admitting: *Deleted

## 2021-04-05 MED ORDER — POLYETHYLENE GLYCOL 3350 17 GM/SCOOP PO POWD
17.0000 g | Freq: Every day | ORAL | 3 refills | Status: DC | PRN
  Filled 2021-04-05: qty 255, 15d supply, fill #0
  Filled 2021-04-06: qty 238, 14d supply, fill #0

## 2021-04-05 MED ORDER — CALCIUM CARB-CHOLECALCIFEROL 600-10 MG-MCG PO TABS
1.0000 | ORAL_TABLET | Freq: Two times a day (BID) | ORAL | 0 refills | Status: DC
  Filled 2021-04-05: qty 60, 30d supply, fill #0
  Filled 2021-04-06: qty 180, 90d supply, fill #0

## 2021-04-06 ENCOUNTER — Encounter: Payer: Self-pay | Admitting: Licensed Clinical Social Worker

## 2021-04-06 ENCOUNTER — Other Ambulatory Visit: Payer: Self-pay

## 2021-04-06 ENCOUNTER — Encounter: Payer: Self-pay | Admitting: Oncology

## 2021-04-06 NOTE — Progress Notes (Signed)
[place] CSW Progress Note  Clinical Education officer, museum contacted patient by phone to follow-up. CSW left patient voicemail with contact information and request for return call.    Adelene Amas , LCSW

## 2021-04-13 ENCOUNTER — Ambulatory Visit
Admission: RE | Admit: 2021-04-13 | Discharge: 2021-04-13 | Disposition: A | Discharge status: Home / Self Care | Payer: Self-pay | Source: Ambulatory Visit | Attending: Oncology | Admitting: Oncology

## 2021-04-13 DIAGNOSIS — Z0189 Encounter for other specified special examinations: Secondary | ICD-10-CM

## 2021-04-13 DIAGNOSIS — F419 Anxiety disorder, unspecified: Secondary | ICD-10-CM | POA: Insufficient documentation

## 2021-04-13 DIAGNOSIS — C50919 Malignant neoplasm of unspecified site of unspecified female breast: Secondary | ICD-10-CM | POA: Insufficient documentation

## 2021-04-13 DIAGNOSIS — Z8616 Personal history of COVID-19: Secondary | ICD-10-CM | POA: Insufficient documentation

## 2021-04-13 LAB — ECHOCARDIOGRAM COMPLETE
AR max vel: 2.58 cm2
AV Area VTI: 2.59 cm2
AV Area mean vel: 2.49 cm2
AV Mean grad: 2 mmHg
AV Peak grad: 3.6 mmHg
Ao pk vel: 0.95 m/s
Area-P 1/2: 6.87 cm2
MV VTI: 3.17 cm2
S' Lateral: 2 cm

## 2021-04-13 NOTE — Progress Notes (Signed)
*  PRELIMINARY RESULTS* Echocardiogram 2D Echocardiogram has been performed.  Sherrie Sport 04/13/2021, 12:19 PM

## 2021-04-15 ENCOUNTER — Other Ambulatory Visit: Payer: Self-pay | Admitting: *Deleted

## 2021-04-15 MED ORDER — DULOXETINE HCL 30 MG PO CPEP
30.0000 mg | ORAL_CAPSULE | Freq: Every day | ORAL | 3 refills | Status: DC
Start: 2021-04-15 — End: 2021-05-13

## 2021-04-18 ENCOUNTER — Encounter: Payer: Self-pay | Admitting: Licensed Clinical Social Worker

## 2021-04-18 NOTE — Progress Notes (Signed)
[place] CSW Progress Note  Clinical Education officer, museum contacted patient by phone to follow-up on previous conversation. Patient stated she is doing better since her fall in December but she is still using the wheelchair to get around and often needs assistance from her children to complete ADLs.  CSW asked patient about long term plans for the custody of her children.  Patient stated her youngest child would go to his father, but her older children would go to her friend, who has agreed to care for the children if the patient is unable to do so. CSW asked if she had discussed guardianship/custody for her children, with the her friend, patient stated she had not and did not know how to go about getting the paperwork done.  CSW stated I would contact Legal [employer] to find out if they would be able to assist the patient wit this matter.  CSW also asked the patient about her current concerns over her rent payment.  The patient mentioned she and her children moved in December to a bigger home and one that was in better condition but the rent is now 800.00 per month and she is unable to afford it, since she is unable to work. Patient also stated she is behind on her gas and water payments.  CSW stated tat I would find out if the patient still qualifies for assistance from the [employer], and I would also contact other non-profit agencies and find out if there was nay assistance.  CSW requested the patient speak with her friend, so make a plan incase the patient is unable to stay in her current home and was evicted.  Patient stated she would speak to her friend about it.  CSW mentioned the Spanish Speaker support group and patient stated she was interested in attending.  CSW stated I would contact the patient a week before the meeting to confirm.  CSW stated I would contact the patient next week. CSW encouraged patient to contact me if she had other questions or concerns.   Adelene Amas , LCSW

## 2021-04-19 ENCOUNTER — Encounter: Payer: Self-pay | Admitting: Licensed Clinical Social Worker

## 2021-04-19 NOTE — Progress Notes (Signed)
[place] CSW Progress Note  Clinical Education officer, museum contacted patient by phone to discuss financial assistance resources. CSW updated patient about the [facility] financial assistance application.  Patient agreed to application.  CSW will assist patient with application process.    Adelene Amas , LCSW

## 2021-04-21 ENCOUNTER — Encounter: Payer: Self-pay | Admitting: Licensed Clinical Social Worker

## 2021-04-22 ENCOUNTER — Other Ambulatory Visit: Payer: Self-pay

## 2021-04-22 ENCOUNTER — Encounter: Payer: Self-pay | Admitting: Oncology

## 2021-04-22 ENCOUNTER — Other Ambulatory Visit: Payer: Self-pay | Admitting: *Deleted

## 2021-04-22 ENCOUNTER — Inpatient Hospital Stay: Payer: Self-pay | Attending: Oncology | Admitting: Oncology

## 2021-04-22 ENCOUNTER — Inpatient Hospital Stay: Payer: Self-pay

## 2021-04-22 VITALS — BP 108/74 | HR 85 | Temp 97.9°F | Wt 145.0 lb

## 2021-04-22 VITALS — BP 108/74 | HR 85 | Temp 97.9°F | Resp 16 | Ht 62.0 in

## 2021-04-22 DIAGNOSIS — C7931 Secondary malignant neoplasm of brain: Secondary | ICD-10-CM

## 2021-04-22 DIAGNOSIS — C7951 Secondary malignant neoplasm of bone: Secondary | ICD-10-CM

## 2021-04-22 DIAGNOSIS — M8440XA Pathological fracture, unspecified site, initial encounter for fracture: Secondary | ICD-10-CM

## 2021-04-22 DIAGNOSIS — R110 Nausea: Secondary | ICD-10-CM | POA: Insufficient documentation

## 2021-04-22 DIAGNOSIS — Z79899 Other long term (current) drug therapy: Secondary | ICD-10-CM

## 2021-04-22 DIAGNOSIS — Z171 Estrogen receptor negative status [ER-]: Secondary | ICD-10-CM | POA: Insufficient documentation

## 2021-04-22 DIAGNOSIS — Z800 Family history of malignant neoplasm of digestive organs: Secondary | ICD-10-CM | POA: Insufficient documentation

## 2021-04-22 DIAGNOSIS — C50919 Malignant neoplasm of unspecified site of unspecified female breast: Secondary | ICD-10-CM

## 2021-04-22 DIAGNOSIS — Z5112 Encounter for antineoplastic immunotherapy: Secondary | ICD-10-CM

## 2021-04-22 DIAGNOSIS — Z5111 Encounter for antineoplastic chemotherapy: Secondary | ICD-10-CM | POA: Insufficient documentation

## 2021-04-22 DIAGNOSIS — C50412 Malignant neoplasm of upper-outer quadrant of left female breast: Secondary | ICD-10-CM | POA: Insufficient documentation

## 2021-04-22 DIAGNOSIS — Z5181 Encounter for therapeutic drug level monitoring: Secondary | ICD-10-CM

## 2021-04-22 LAB — CBC WITH DIFFERENTIAL/PLATELET
Abs Immature Granulocytes: 0.06 10*3/uL (ref 0.00–0.07)
Basophils Absolute: 0 10*3/uL (ref 0.0–0.1)
Basophils Relative: 1 %
Eosinophils Absolute: 0.3 10*3/uL (ref 0.0–0.5)
Eosinophils Relative: 5 %
HCT: 36 % (ref 36.0–46.0)
Hemoglobin: 12.3 g/dL (ref 12.0–15.0)
Immature Granulocytes: 1 %
Lymphocytes Relative: 17 %
Lymphs Abs: 1 10*3/uL (ref 0.7–4.0)
MCH: 30.2 pg (ref 26.0–34.0)
MCHC: 34.2 g/dL (ref 30.0–36.0)
MCV: 88.5 fL (ref 80.0–100.0)
Monocytes Absolute: 0.5 10*3/uL (ref 0.1–1.0)
Monocytes Relative: 8 %
Neutro Abs: 4.1 10*3/uL (ref 1.7–7.7)
Neutrophils Relative %: 68 %
Platelets: 385 10*3/uL (ref 150–400)
RBC: 4.07 MIL/uL (ref 3.87–5.11)
RDW: 14 % (ref 11.5–15.5)
WBC: 6 10*3/uL (ref 4.0–10.5)
nRBC: 0 % (ref 0.0–0.2)

## 2021-04-22 LAB — COMPREHENSIVE METABOLIC PANEL
ALT: 23 U/L (ref 0–44)
AST: 27 U/L (ref 15–41)
Albumin: 3.5 g/dL (ref 3.5–5.0)
Alkaline Phosphatase: 72 U/L (ref 38–126)
Anion gap: 6 (ref 5–15)
BUN: <5 mg/dL — ABNORMAL LOW (ref 6–20)
CO2: 25 mmol/L (ref 22–32)
Calcium: 8.7 mg/dL — ABNORMAL LOW (ref 8.9–10.3)
Chloride: 103 mmol/L (ref 98–111)
Creatinine, Ser: 0.55 mg/dL (ref 0.44–1.00)
GFR, Estimated: >60 mL/min (ref >60)
Glucose, Bld: 107 mg/dL — ABNORMAL HIGH (ref 70–99)
Potassium: 3.3 mmol/L — ABNORMAL LOW (ref 3.5–5.1)
Sodium: 134 mmol/L — ABNORMAL LOW (ref 135–145)
Total Bilirubin: 0.4 mg/dL (ref 0.3–1.2)
Total Protein: 7.2 g/dL (ref 6.5–8.1)

## 2021-04-22 MED ORDER — LIDOCAINE-PRILOCAINE 2.5-2.5 % EX CREA
1.0000 "application " | TOPICAL_CREAM | CUTANEOUS | 3 refills | Status: DC | PRN
  Filled 2021-04-22: qty 30, 30d supply, fill #0

## 2021-04-22 MED ORDER — DIPHENHYDRAMINE HCL 50 MG/ML IJ SOLN
50.0000 mg | Freq: Once | INTRAMUSCULAR | Status: AC
  Administered 2021-04-22: 50 mg via INTRAVENOUS
  Filled 2021-04-22: qty 1

## 2021-04-22 MED ORDER — SODIUM CHLORIDE 0.9 % IV SOLN
420.0000 mg | Freq: Once | INTRAVENOUS | Status: AC
  Administered 2021-04-22: 420 mg via INTRAVENOUS
  Filled 2021-04-22: qty 14

## 2021-04-22 MED ORDER — SODIUM CHLORIDE 0.9 % IV SOLN
Freq: Once | INTRAVENOUS | Status: AC
  Filled 2021-04-22: qty 250

## 2021-04-22 MED ORDER — TRASTUZUMAB-ANNS CHEMO 150 MG IV SOLR
520.0000 mg | Freq: Once | INTRAVENOUS | Status: AC
  Administered 2021-04-22: 520 mg via INTRAVENOUS
  Filled 2021-04-22: qty 24.76

## 2021-04-22 MED ORDER — OXYCODONE HCL 5 MG PO TABS
5.0000 mg | ORAL_TABLET | ORAL | 0 refills | Status: DC | PRN
Start: 2021-04-22 — End: 2021-06-11
  Filled 2021-04-22: qty 90, 8d supply, fill #0

## 2021-04-22 MED ORDER — DENOSUMAB 120 MG/1.7ML ~~LOC~~ SOLN
120.0000 mg | Freq: Once | SUBCUTANEOUS | Status: AC
  Administered 2021-04-22: 120 mg via SUBCUTANEOUS
  Filled 2021-04-22: qty 1.7

## 2021-04-22 MED ORDER — ACETAMINOPHEN 325 MG PO TABS
650.0000 mg | ORAL_TABLET | Freq: Once | ORAL | Status: AC
  Administered 2021-04-22: 650 mg via ORAL
  Filled 2021-04-22: qty 2

## 2021-04-22 MED ORDER — HEPARIN SOD (PORK) LOCK FLUSH 100 UNIT/ML IV SOLN
500.0000 [IU] | Freq: Once | INTRAVENOUS | Status: AC | PRN
  Administered 2021-04-22: 500 [IU]
  Filled 2021-04-22: qty 5

## 2021-04-22 NOTE — Progress Notes (Signed)
Pt has been having nausea and she takes compazine and it gets better, she has had lower abd .pain  and it starts about 6 pm and last couple of hours. She goes and lays down when it starts. Eating and drinking good. Needs refill of emla cream and short acting oxycodone

## 2021-04-22 NOTE — Progress Notes (Signed)
mri

## 2021-04-22 NOTE — Progress Notes (Signed)
Hematology/Oncology Consult note [facility]  [phone] [phone]  Patient Care Team: Sindy Guadeloupe, MD as PCP - General (Oncology) Rico Junker, RN as Registered Nurse Theodore Demark, RN as Registered Nurse Sindy Guadeloupe, MD as Consulting Physician (Hematology and Oncology)   Name of the patient: Sophia [name]  [id number]  [birth year]   Date of visit: 04/22/21  Diagnosis- stage IV metastatic breast cancer ER/PR negative HER-2/neu positive with bone metastases    Chief complaint/ Reason for visit-on treatment assessment prior to next cycle of Herceptin and Perjeta  Heme/Onc history:  patient is a 41 year old Hispanic female who is here with her friend.  History obtained with the help of an interpreter.Patient self palpated left breast mass which was followed by a diagnostic bilateral mammogram.  Mammogram showed 3.1 x 2.9 x 1.9 cm hypoechoic mass at the 1 o'clock position of the left breast.  For abnormal cortically thickened left axillary lymph nodes measuring up to 5 mm.  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  Patient was also having ongoing back pain and was seen by [facility] orthopedics Dr. Doyle Askew who ordered MRI lumbar spine without contrast which showed possible pathologic fractures of L1 and L4 vertebral bodies with greater than 50% height loss at L1 and abnormal signal involving L2-L3 S1 as well as right iliac bone concerning for metastatic disease.  Patient is a single mother of 3 adult children and is very anxious today.  She reports significant back pain which radiates to her bilateral thighs.  Denies any focal tingling numbness or weakness.  Denies any bowel bladder incontinence.  Pain has been uncontrolled despite taking Tylenol.  No prior history of abnormal breast biopsies.  No family history of breast  cancer   PET and MRI showed 3 areas of pathologic fracture of her spine as well as widespread bony metastatic disease and concern for impending fracture of the right hip.  Given her worsening pain she was asked to come to the ER.  She has been evaluated by Dr. Rudene Christians from orthopedic surgery and underwent kyphoplasty at 3 different levels. T6 L1 and L4 along with radiofrequency ablation.    She also underwent prophylactic fixation of the right hip and not affected the sacral region.     Patient received first dose of Herceptin and Perjeta on 09/04/2019.  Patient initially received Taxol and Herceptin perjeta.  Presently patient is on maintenance Herceptin and Perjeta alone.        Interval history-patient is doing better with her ambulation and she is able to use a walker to ambulate around the house.  She is able to climb 1 or 2 stairs as well.  Pain is currently well controlled.  ECOG PS- 1 Pain scale- 3 Opioid associated constipation- no  Review of systems- Review of Systems  Constitutional:  Positive for malaise/fatigue. Negative for chills, fever and weight loss.  HENT:  Negative for congestion, ear discharge and nosebleeds.   Eyes:  Negative for blurred vision.  Respiratory:  Negative for cough, hemoptysis, sputum production, shortness of breath and wheezing.   Cardiovascular:  Negative for chest pain, palpitations, orthopnea and claudication.  Gastrointestinal:  Negative for abdominal pain, blood in stool, constipation, diarrhea, heartburn, melena, nausea and vomiting.  Genitourinary:  Negative for dysuria, flank pain, frequency, hematuria and urgency.  Musculoskeletal:  Negative  for back pain, joint pain and myalgias.       Right hip pain  Skin:  Negative for rash.  Neurological:  Negative for dizziness, tingling, focal weakness, seizures, weakness and headaches.  Endo/Heme/Allergies:  Does not bruise/bleed easily.  Psychiatric/Behavioral:  Negative for depression and suicidal ideas.  The patient does not have insomnia.       No Known Allergies   Past Medical History:  Diagnosis Date   Anxiety    Breast cancer ([place])    with mets   Cancer ([place])    Colitis    COVID-19 in immunocompromised patient ([place])    Family history of colon cancer    Vertigo      Past Surgical History:  Procedure Laterality Date   BREAST BIOPSY Left 08/14/2019   [place] bx of mass, coil marker, path pending   BREAST BIOPSY Left 08/14/2019   [place] bx of LN, hydromarker, path pending   BREAST BIOPSY Left 08/14/2019   affirm bx of calcs, x marker, path pending   ESOPHAGOGASTRODUODENOSCOPY (EGD) WITH PROPOFOL N/A 10/05/2019   Procedure: ESOPHAGOGASTRODUODENOSCOPY (EGD) WITH PROPOFOL;  Surgeon: Lin Landsman, MD;  Location: [place];  Service: Gastroenterology;  Laterality: N/A;   FLEXIBLE SIGMOIDOSCOPY N/A 10/05/2019   Procedure: FLEXIBLE SIGMOIDOSCOPY;  Surgeon: Lin Landsman, MD;  Location: [facility] ENDOSCOPY;  Service: Gastroenterology;  Laterality: N/A;   INTRAMEDULLARY (IM) NAIL INTERTROCHANTERIC Right 09/01/2019   Procedure: INTRAMEDULLARY (IM) NAIL INTERTROCHANTRIC AND RADIOFREQUENCY ABLATION;  Surgeon: Hessie Knows, MD;  Location: [facility] ORS;  Service: Orthopedics;  Laterality: Right;   KYPHOPLASTY N/A 08/29/2019   Procedure: KYPHOPLASTY T6, L1,L4 ,  RADIOFREQUENCY ABLATION;  Surgeon: Hessie Knows, MD;  Location: [facility] ORS;  Service: Orthopedics;  Laterality: N/A;   KYPHOPLASTY Right 09/01/2019   Procedure: Right Sacral Radiofrequency Ablation and Cement Augmentation, Right sacrum and iliac crest;  Surgeon: Hessie Knows, MD;  Location: [facility] ORS;  Service: Orthopedics;  Laterality: Right;   PORTA CATH INSERTION N/A 08/28/2019   Procedure: PORTA CATH INSERTION;  Surgeon: Algernon Huxley, MD;  Location: [place] [facility];  Service: Cardiovascular;  Laterality: N/A;    Social History   Socioeconomic History   Marital status: Single    Spouse name: Not on file   Number of children:  Not on file   Years of education: Not on file   Highest education level: Not on file  Occupational History   Not on file  Tobacco Use   Smoking status: Never   Smokeless tobacco: Never  Vaping Use   Vaping Use: Never used  Substance and Sexual Activity   Alcohol use: Not Currently   Drug use: Not Currently   Sexual activity: Not Currently    Birth control/protection: None  Other Topics Concern   Not on file  Social History Narrative   Lives at home with children   Social Determinants of Health   Financial Resource Strain: Not on file  Food Insecurity: Not on file  Transportation Needs: Not on file  Physical Activity: Not on file  Stress: Not on file  Social Connections: Not on file  Intimate Partner Violence: Not on file    Family History  Problem Relation Age of Onset   Colon cancer Maternal Uncle      Current Outpatient Medications:    Calcium Carb-Cholecalciferol 600-10 MG-MCG TABS, Take 1 tablet by mouth 2 (two) times daily., Disp: 180 tablet, Rfl: 0   DULoxetine (CYMBALTA) 30 MG capsule, Take 1 capsule (30 mg total) by  mouth daily., Disp: 30 capsule, Rfl: 3   gabapentin (NEURONTIN) 600 MG tablet, Take 1 tablet (600 mg total) by mouth 3 (three) times daily., Disp: 90 tablet, Rfl: 3   LORazepam (ATIVAN) 0.5 MG tablet, Take 1 tablet (0.5 mg total) by mouth every 6 (six) hours as needed for anxiety. AS NEEDED FOR NAUSEA, Disp: 45 tablet, Rfl: 0   oxyCODONE (OXYCONTIN) 10 mg 12 hr tablet, Take 1 tablet (10 mg total) by mouth every 12 (twelve) hours., Disp: 60 tablet, Rfl: 0   polyethylene glycol powder (MIRALAX) 17 GM/SCOOP powder, Take 17 g by mouth daily as needed., Disp: 238 g, Rfl: 3   prochlorperazine (COMPAZINE) 10 MG tablet, Take 1 tablet (10 mg total) by mouth every 6 (six) hours as needed for nausea or vomiting., Disp: 12 tablet, Rfl: 0   albuterol (VENTOLIN HFA) 108 (90 Base) MCG/ACT inhaler, Inhale 2 puffs into the lungs every 6 (six) hours as needed for  wheezing or shortness of breath. (Patient not taking: Reported on 11/19/2020), Disp: 8 g, Rfl: 2   diphenoxylate-atropine (LOMOTIL) 2.5-0.025 MG tablet, Take 1 tablet by mouth 4 (four) times daily as needed for diarrhea or loose stools. (Patient not taking: Reported on 11/19/2020), Disp: 40 tablet, Rfl: 0   lidocaine-prilocaine (EMLA) cream, Apply 1 application topically as needed. Apply small amount to port site at least 1 hour prior to it being accessed, cover with plastic wrap, Disp: 30 g, Rfl: 3   oxyCODONE (OXY IR/ROXICODONE) 5 MG immediate release tablet, Take 1-2 tablets (5-10 mg total) by mouth every 4 (four) hours as needed., Disp: 90 tablet, Rfl: 0 No current facility-administered medications for this visit.  Facility-Administered Medications Ordered in Other Visits:    0.9 %  sodium chloride infusion, , Intravenous, Continuous, Burns, Wandra Feinstein, NP, Stopped at 06/02/20 1453   denosumab (XGEVA) injection 120 mg, 120 mg, Subcutaneous, Once, Sindy Guadeloupe, MD   heparin lock flush 100 unit/mL, 500 Units, Intravenous, Once, Sindy Guadeloupe, MD   heparin lock flush 100 unit/mL, 500 Units, Intravenous, Once, Sindy Guadeloupe, MD   heparin lock flush 100 unit/mL, 500 Units, Intracatheter, Once PRN, Sindy Guadeloupe, MD   pertuzumab (PERJETA) 420 mg in sodium chloride 0.9 % 250 mL chemo infusion, 420 mg, Intravenous, Once, Sindy Guadeloupe, MD, Last Rate: 528 mL/hr at 04/22/21 1154, 420 mg at 04/22/21 1154   sodium chloride flush (NS) 0.9 % injection 10 mL, 10 mL, Intravenous, Once, Sindy Guadeloupe, MD   sodium chloride flush (NS) 0.9 % injection 10 mL, 10 mL, Intravenous, Once, Sindy Guadeloupe, MD  Physical exam:  Vitals:   04/22/21 1043  BP: 108/74  Pulse: 85  Resp: 16  Temp: 97.9 F (36.6 C)  TempSrc: Tympanic  SpO2: 100%  Height: _0  (1.575 m)   Physical Exam Constitutional:      General: She is not in acute distress. Cardiovascular:     Rate and Rhythm: Normal rate and regular rhythm.      Heart sounds: Normal heart sounds.  Pulmonary:     Effort: Pulmonary effort is normal.     Breath sounds: Normal breath sounds.  Abdominal:     General: Bowel sounds are normal.     Palpations: Abdomen is soft.  Skin:    General: Skin is warm and dry.  Neurological:     Mental Status: She is alert and oriented to person, place, and time.     CMP Latest Ref Rng &  Units 04/22/2021  Glucose 70 - 99 mg/dL 107(H)  BUN 6 - 20 mg/dL <5(L)  Creatinine 0.44 - 1.00 mg/dL 0.55  Sodium 135 - 145 mmol/L 134(L)  Potassium 3.5 - 5.1 mmol/L 3.3(L)  Chloride 98 - 111 mmol/L 103  CO2 22 - 32 mmol/L 25  Calcium 8.9 - 10.3 mg/dL 8.7(L)  Total Protein 6.5 - 8.1 g/dL 7.2  Total Bilirubin 0.3 - 1.2 mg/dL 0.4  Alkaline Phos 38 - 126 U/L 72  AST 15 - 41 U/L 27  ALT 0 - 44 U/L 23   CBC Latest Ref Rng & Units 04/22/2021  WBC 4.0 - 10.5 K/uL 6.0  Hemoglobin 12.0 - 15.0 g/dL 12.3  Hematocrit 36.0 - 46.0 % 36.0  Platelets 150 - 400 K/uL 385    No images are attached to the encounter.  ECHOCARDIOGRAM COMPLETE  Result Date: 04/13/2021    ECHOCARDIOGRAM REPORT   Patient Name:   Sophia [name] [id number] Date of Exam: 04/13/2021 Medical Rec #:  [id number]             Height:       62.0 in Accession #:    [id number]            Weight:       145.0 lb Date of Birth:  [birth year]             BSA:          1.667 m Patient Age:    41 years              BP:           123/81 mmHg Patient Gender: F                     HR:           75 bpm. Exam Location:  [facility] Procedure: 2D Echo, Cardiac Doppler and Color Doppler Indications:     C50.919 Metastatic breast cancer  History:         Patient has prior history of Echocardiogram examinations, most                  recent 01/05/2021. Anxiety, breast cancer, covid-19.  Sonographer:     Sherrie Sport Referring Phys:  [id number] [facility] C Braison Snoke Diagnosing Phys: Harrell Gave End MD IMPRESSIONS  1. Left ventricular ejection fraction, by estimation, is 60 to 65%. The left ventricle has normal  function. Left ventricular endocardial border not optimally defined to evaluate regional wall motion. Indeterminate diastolic filling due to E-A fusion.  2. Right ventricular systolic function is normal. The right ventricular size is normal. Tricuspid regurgitation signal is inadequate for assessing PA pressure.  3. The mitral valve is normal in structure. No evidence of mitral valve regurgitation.  4. The aortic valve is tricuspid. Aortic valve regurgitation is not visualized. No aortic stenosis is present. FINDINGS  Left Ventricle: Left ventricular ejection fraction, by estimation, is 60 to 65%. The left ventricle has normal function. Left ventricular endocardial border not optimally defined to evaluate regional wall motion. The left ventricular internal cavity size was normal in size. There is no left ventricular hypertrophy. Indeterminate diastolic filling due to E-A fusion. Right Ventricle: The right ventricular size is normal. No increase in right ventricular wall thickness. Right ventricular systolic function is normal. Tricuspid regurgitation signal is inadequate for assessing PA pressure. Left Atrium: Left atrial size was normal in size. Right Atrium: Right atrial size was normal in size. Pericardium: The  pericardium was not well visualized. Mitral Valve: The mitral valve is normal in structure. No evidence of mitral valve regurgitation. MV peak gradient, 3.2 mmHg. The mean mitral valve gradient is 2.0 mmHg. Tricuspid Valve: The tricuspid valve is not well visualized. Tricuspid valve regurgitation is trivial. Aortic Valve: The aortic valve is tricuspid. Aortic valve regurgitation is not visualized. No aortic stenosis is present. Aortic valve mean gradient measures 2.0 mmHg. Aortic valve peak gradient measures 3.6 mmHg. Aortic valve area, by VTI measures 2.59 cm. Pulmonic Valve: The pulmonic valve was normal in structure. Pulmonic valve regurgitation is trivial. No evidence of pulmonic stenosis. Aorta: The  aortic root is normal in size and structure. Pulmonary Artery: The pulmonary artery is of normal size. Venous: The inferior vena cava was not well visualized. IAS/Shunts: The interatrial septum was not well visualized.  LEFT VENTRICLE PLAX 2D LVIDd:         3.50 cm   Diastology LVIDs:         2.00 cm   LV e' medial:    4.46 cm/s LV PW:         0.70 cm   LV E/e' medial:  12.8 LV IVS:        0.75 cm   LV e' lateral:   9.57 cm/s LVOT diam:     2.00 cm   LV E/e' lateral: 6.0 LV SV:         35 LV SV Index:   21 LVOT Area:     3.14 cm  RIGHT VENTRICLE RV Basal diam:  3.10 cm LEFT ATRIUM           Index        RIGHT ATRIUM           Index LA diam:      2.70 cm 1.62 cm/m   RA Area:     13.20 cm LA Vol (A4C): 21.9 ml 13.13 ml/m  RA Volume:   30.80 ml  18.47 ml/m  AORTIC VALVE                    PULMONIC VALVE AV Area (Vmax):    2.58 cm     PV Vmax:        0.79 m/s AV Area (Vmean):   2.49 cm     PV Vmean:       53.450 cm/s AV Area (VTI):     2.59 cm     PV VTI:         0.118 m AV Vmax:           94.70 cm/s   PV Peak grad:   2.5 mmHg AV Vmean:          64.300 cm/s  PV Mean grad:   1.5 mmHg AV VTI:            0.136 m      RVOT Peak grad: 3 mmHg AV Peak Grad:      3.6 mmHg AV Mean Grad:      2.0 mmHg LVOT Vmax:         77.70 cm/s LVOT Vmean:        50.900 cm/s LVOT VTI:          0.112 m LVOT/AV VTI ratio: 0.82  AORTA Ao Root diam: 2.77 cm MITRAL VALVE MV Area (PHT): 6.87 cm    SHUNTS MV Area VTI:   3.17 cm    Systemic VTI:  0.11 m MV Peak grad:  3.2 mmHg    Systemic Diam: 2.00 cm MV Mean grad:  2.0 mmHg    Pulmonic VTI:  0.124 m MV Vmax:       0.89 m/s MV Vmean:      58.2 cm/s MV Decel Time: 111 msec MV E velocity: 57.00 cm/s MV A velocity: 77.55 cm/s MV E/A ratio:  0.74 Christopher End MD Electronically signed by Nelva Bush MD Signature Date/Time: 04/13/2021/1:41:59 PM    Final      Assessment and plan- Patient is a 41 y.o. female ER negative HER2 positive metastatic breast cancer with bone metastases. She was  then found to have brain metastases s/p whole brain radiation treatment.Patient is here for on treatment assessment prior to cycle 27 of maintenance Herceptin and Perjeta  Counts okay to proceed with cycle 27 of maintenance Herceptin and Perjeta today.  She will also get Xgeva today for bone metastases. She will proceed with cycle 28 in 3 weeks and I will see her back in 6 weeks for cycle 29 of Herceptin and Perjeta along with Xgeva.  CT chest abdomen and pelvis with contrast and bone scan as well as MRI brain with and without contrast to be done in about 4 to 5 weeks time.    Neoplasm related pain: Continue as needed oxycodone   Visit Diagnosis 1. Metastatic breast cancer ([place])   2. Brain metastases ([place])   3. Bone metastases ([place])   4. Encounter for monoclonal antibody treatment for malignancy   5. Encounter for monitoring denosumab therapy      Dr. Randa Evens, MD, MPH [facility] [phone] 04/22/2021 12:07 PM

## 2021-04-25 ENCOUNTER — Other Ambulatory Visit: Payer: Self-pay | Admitting: Primary Care

## 2021-04-25 ENCOUNTER — Encounter: Payer: Self-pay | Admitting: Licensed Clinical Social Worker

## 2021-04-25 ENCOUNTER — Other Ambulatory Visit: Payer: Self-pay

## 2021-04-25 NOTE — Progress Notes (Signed)
[facility] CSW Progress Note  Clinical Social Worker contacted patient by phone to update her on the [employer] financial assistance application (emailed on 04/25/2021) and the [employer] financial assistance applications (emailed 2/56/3893).   Adelene Amas , LCSW

## 2021-04-26 ENCOUNTER — Telehealth: Payer: Self-pay

## 2021-04-26 NOTE — Telephone Encounter (Signed)
Patient called and stated that she did not feel well. Has had dysuria for a week and a half and nausea since Friday. States she has not been able to eat anything but crackers and drink some water. Nausea medication is not working. Per Judeen Hammans patient should be seen by [facility]. Could not get her in until 2/8 @ 10:30. Patient expressed understanding and stated that she would make the appointment. Scheduled by Clent Ridges.

## 2021-04-27 ENCOUNTER — Encounter: Payer: Self-pay | Admitting: Licensed Clinical Social Worker

## 2021-04-27 ENCOUNTER — Inpatient Hospital Stay: Payer: Self-pay

## 2021-04-27 ENCOUNTER — Other Ambulatory Visit: Payer: Self-pay

## 2021-04-27 ENCOUNTER — Encounter: Payer: Self-pay | Admitting: Oncology

## 2021-04-27 ENCOUNTER — Inpatient Hospital Stay (HOSPITAL_BASED_OUTPATIENT_CLINIC_OR_DEPARTMENT_OTHER): Payer: Self-pay | Admitting: Hospice and Palliative Medicine

## 2021-04-27 VITALS — BP 115/80 | HR 86 | Temp 99.0°F | Resp 16 | Wt 140.0 lb

## 2021-04-27 DIAGNOSIS — Z95828 Presence of other vascular implants and grafts: Secondary | ICD-10-CM

## 2021-04-27 DIAGNOSIS — C50919 Malignant neoplasm of unspecified site of unspecified female breast: Secondary | ICD-10-CM

## 2021-04-27 DIAGNOSIS — Z515 Encounter for palliative care: Secondary | ICD-10-CM

## 2021-04-27 DIAGNOSIS — R110 Nausea: Secondary | ICD-10-CM

## 2021-04-27 LAB — URINALYSIS, COMPLETE (UACMP) WITH MICROSCOPIC
Bilirubin Urine: NEGATIVE
Glucose, UA: NEGATIVE mg/dL
Ketones, ur: NEGATIVE mg/dL
Nitrite: NEGATIVE
Protein, ur: NEGATIVE mg/dL
Specific Gravity, Urine: 1.011 (ref 1.005–1.030)
WBC, UA: >50 WBC/hpf — ABNORMAL HIGH (ref 0–5)
pH: 5 (ref 5.0–8.0)

## 2021-04-27 LAB — CBC WITH DIFFERENTIAL/PLATELET
Abs Immature Granulocytes: 0.06 10*3/uL (ref 0.00–0.07)
Basophils Absolute: 0.1 10*3/uL (ref 0.0–0.1)
Basophils Relative: 1 %
Eosinophils Absolute: 0.1 10*3/uL (ref 0.0–0.5)
Eosinophils Relative: 2 %
HCT: 40 % (ref 36.0–46.0)
Hemoglobin: 13.5 g/dL (ref 12.0–15.0)
Immature Granulocytes: 1 %
Lymphocytes Relative: 18 %
Lymphs Abs: 1.1 10*3/uL (ref 0.7–4.0)
MCH: 29.7 pg (ref 26.0–34.0)
MCHC: 33.8 g/dL (ref 30.0–36.0)
MCV: 87.9 fL (ref 80.0–100.0)
Monocytes Absolute: 0.4 10*3/uL (ref 0.1–1.0)
Monocytes Relative: 7 %
Neutro Abs: 4.4 10*3/uL (ref 1.7–7.7)
Neutrophils Relative %: 71 %
Platelets: 477 10*3/uL — ABNORMAL HIGH (ref 150–400)
RBC: 4.55 MIL/uL (ref 3.87–5.11)
RDW: 13.8 % (ref 11.5–15.5)
WBC: 6.1 10*3/uL (ref 4.0–10.5)
nRBC: 0 % (ref 0.0–0.2)

## 2021-04-27 LAB — COMPREHENSIVE METABOLIC PANEL
ALT: 23 U/L (ref 0–44)
AST: 26 U/L (ref 15–41)
Albumin: 3.8 g/dL (ref 3.5–5.0)
Alkaline Phosphatase: 75 U/L (ref 38–126)
Anion gap: 6 (ref 5–15)
BUN: 10 mg/dL (ref 6–20)
CO2: 23 mmol/L (ref 22–32)
Calcium: 8.5 mg/dL — ABNORMAL LOW (ref 8.9–10.3)
Chloride: 106 mmol/L (ref 98–111)
Creatinine, Ser: 0.52 mg/dL (ref 0.44–1.00)
GFR, Estimated: >60 mL/min (ref >60)
Glucose, Bld: 96 mg/dL (ref 70–99)
Potassium: 3.7 mmol/L (ref 3.5–5.1)
Sodium: 135 mmol/L (ref 135–145)
Total Bilirubin: 0.4 mg/dL (ref 0.3–1.2)
Total Protein: 7.9 g/dL (ref 6.5–8.1)

## 2021-04-27 MED ORDER — SODIUM CHLORIDE 0.9% FLUSH
10.0000 mL | Freq: Once | INTRAVENOUS | Status: DC
  Filled 2021-04-27: qty 10

## 2021-04-27 MED ORDER — ONDANSETRON HCL 8 MG PO TABS
8.0000 mg | ORAL_TABLET | Freq: Three times a day (TID) | ORAL | 2 refills | Status: DC | PRN
  Filled 2021-04-27 (×2): qty 45, 15d supply, fill #0

## 2021-04-27 MED ORDER — PROCHLORPERAZINE MALEATE 10 MG PO TABS
10.0000 mg | ORAL_TABLET | Freq: Four times a day (QID) | ORAL | 0 refills | Status: DC | PRN
  Filled 2021-04-27 (×2): qty 12, 3d supply, fill #0

## 2021-04-27 MED ORDER — HEPARIN SOD (PORK) LOCK FLUSH 100 UNIT/ML IV SOLN
500.0000 [IU] | Freq: Once | INTRAVENOUS | Status: AC
  Administered 2021-04-27: 500 [IU] via INTRAVENOUS
  Filled 2021-04-27: qty 5

## 2021-04-27 MED ORDER — SODIUM CHLORIDE 0.9 % IV SOLN
INTRAVENOUS | Status: AC
  Filled 2021-04-27: qty 250

## 2021-04-27 NOTE — Progress Notes (Signed)
[facility] CSW Progress Note  Holiday representative met with patient, and [name] Borders NP to discuss treatment plan and psychosocial needs.  Patient was receiving IV fluids and accompanied by [employer].  [name] Borders spoke with patient about diagnosis, and treatment plan options.  NP also spoke with patient about Advanced Directives and patient agreed to think about it and discuss end of life decisions with her friend Izola Price who is her main support. Patient also agreed to go over Advanced Directives with NP during her next appointment to [facility]. CSW spoke with the patient about her current concerns in reference to resources and finances.  CSW stated that I will continue to look for other resources for financial assistance.  Patient stated she spoke with her oldest children about working part-time and both agreed they would begin looking for work. Patient agreed to allow CSW to contact DSS to assist with custodial process for patient's minor children if patient is unable to care for them before the oldest child turn 107.  CSW stated I would contact patient with any updates on resources and DSS inquiries.  CSW gave patient card with contact information.  Patient verbalized understanding and stated she would contact CSW with any questions or concerns.    Adelene Amas , LCSW

## 2021-04-27 NOTE — Progress Notes (Signed)
Pt in smc today with reports of dysuria, nausea, and vomiting, for the last several days. Pt states that she has not had much of an appetite. Denies any diarrhea. States that she was constipated, so she took some miralax, and reports that she had a loose BM this morning and stated that she observed blood in it.

## 2021-04-27 NOTE — Progress Notes (Signed)
[place] at [facility] Telephone:(336) [phone] Fax:(336) [phone]   Name: Sophia [name] Date: 04/27/2021 MRN: [id number]  DOB: 1980/07/11  Patient Care Team: Sindy Guadeloupe, MD as PCP - General (Oncology) Rico Junker, RN as Registered Nurse Theodore Demark, RN as Registered Nurse Sindy Guadeloupe, MD as Consulting Physician (Hematology and Oncology)    REASON FOR CONSULTATION: Sophia [name] is a 41 y.o. 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 MRI of the brain on 02/08/2021 revealed widespread metastatic disease to the brain with mild associated edema and small area of hemorrhage in the right frontal.  Patient is status post whole brain radiation.  She was referred to palliative care to help address goals and manage ongoing symptoms  SOCIAL HISTORY:     reports that she has never smoked. She has never used smokeless tobacco. She reports that she does not currently use alcohol. She reports that she does not currently use drugs.  Patient is from [place].  She speaks [place].  She lives here with her 3 children ages 37-16.  She has no family or friend/social support locally.  She is unable to work.  ADVANCE DIRECTIVES:  Does not have  CODE STATUS:   PAST MEDICAL HISTORY: Past Medical History:  Diagnosis Date   Anxiety    Breast cancer ([place])    with mets   Cancer ([place])    Colitis    COVID-19 in immunocompromised patient ([place])    Family history of colon cancer    Vertigo     PAST SURGICAL HISTORY:  Past Surgical History:  Procedure Laterality Date   BREAST BIOPSY Left 08/14/2019   [place] bx of mass, coil marker, path pending   BREAST BIOPSY Left 08/14/2019   [place] bx of LN, hydromarker, path pending   BREAST BIOPSY Left 08/14/2019   affirm bx of calcs, x marker, path pending   ESOPHAGOGASTRODUODENOSCOPY (EGD) WITH PROPOFOL N/A  10/05/2019   Procedure: ESOPHAGOGASTRODUODENOSCOPY (EGD) WITH PROPOFOL;  Surgeon: Lin Landsman, MD;  Location: [place];  Service: Gastroenterology;  Laterality: N/A;   FLEXIBLE SIGMOIDOSCOPY N/A 10/05/2019   Procedure: FLEXIBLE SIGMOIDOSCOPY;  Surgeon: Lin Landsman, MD;  Location: [facility] ENDOSCOPY;  Service: Gastroenterology;  Laterality: N/A;   INTRAMEDULLARY (IM) NAIL INTERTROCHANTERIC Right 09/01/2019   Procedure: INTRAMEDULLARY (IM) NAIL INTERTROCHANTRIC AND RADIOFREQUENCY ABLATION;  Surgeon: Hessie Knows, MD;  Location: [facility] ORS;  Service: Orthopedics;  Laterality: Right;   KYPHOPLASTY N/A 08/29/2019   Procedure: KYPHOPLASTY T6, L1,L4 ,  RADIOFREQUENCY ABLATION;  Surgeon: Hessie Knows, MD;  Location: [facility] ORS;  Service: Orthopedics;  Laterality: N/A;   KYPHOPLASTY Right 09/01/2019   Procedure: Right Sacral Radiofrequency Ablation and Cement Augmentation, Right sacrum and iliac crest;  Surgeon: Hessie Knows, MD;  Location: [facility] ORS;  Service: Orthopedics;  Laterality: Right;   PORTA CATH INSERTION N/A 08/28/2019   Procedure: PORTA CATH INSERTION;  Surgeon: Algernon Huxley, MD;  Location: [place] CV LAB;  Service: Cardiovascular;  Laterality: N/A;    HEMATOLOGY/ONCOLOGY HISTORY:  Oncology History  Primary cancer of left breast with metastasis to other site [facility])  08/28/2019 Initial Diagnosis   Primary cancer of left breast with metastasis to other site [facility])   01/21/2021 Cancer Staging   Staging form: Breast, AJCC 8th Edition - Clinical stage from 01/21/2021: Stage IV (cT2, cN1, cM1, G2, ER-, PR-, HER2+) - Signed by Sindy Guadeloupe, MD  on 01/21/2021 Histologic grading system: 3 grade system    Metastatic breast cancer ([place])  08/29/2019 Initial Diagnosis   Metastatic breast cancer ([place])   09/04/2019 -  Chemotherapy   Patient is on Treatment Plan : BREAST Weekly Paclitaxel + Trastuzumab + Pertuzumab q21d      Genetic Testing   Negative genetic testing. No pathogenic  variants identified on the [facility] CancerNext-Expanded+RNA panel. The report date is 04/13/2020.   The CancerNext-Expanded + RNAinsight gene panel offered by [employer] and includes sequencing and rearrangement analysis for the following 77 genes: IP, ALK, APC*, ATM*, AXIN2, BAP1, BARD1, BLM, BMPR1A, BRCA1*, BRCA2*, BRIP1*, CDC73, CDH1*,CDK4, CDKN1B, CDKN2A, CHEK2*, CTNNA1, DICER1, FANCC, FH, FLCN, GALNT12, KIF1B, LZTR1, MAX, MEN1, MET, MLH1*, MSH2*, MSH3, MSH6*, MUTYH*, NBN, NF1*, NF2, NTHL1, PALB2*, PHOX2B, PMS2*, POT1, PRKAR1A, PTCH1, PTEN*, RAD51C*, RAD51D*,RB1, RECQL, RET, SDHA, SDHAF2, SDHB, SDHC, SDHD, SMAD4, SMARCA4, SMARCB1, SMARCE1, STK11, SUFU, TMEM127, TP53*,TSC1, TSC2, VHL and XRCC2 (sequencing and deletion/duplication); EGFR, EGLN1, HOXB13, KIT, MITF, PDGFRA, POLD1 and POLE (sequencing only); EPCAM and GREM1 (deletion/duplication only). DNA and RNA analyses performed for * genes.     ALLERGIES:  has No Known Allergies.  MEDICATIONS:  Current Outpatient Medications  Medication Sig Dispense Refill   albuterol (VENTOLIN HFA) 108 (90 Base) MCG/ACT inhaler Inhale 2 puffs into the lungs every 6 (six) hours as needed for wheezing or shortness of breath. (Patient not taking: Reported on 11/19/2020) 8 g 2   Calcium Carb-Cholecalciferol 600-10 MG-MCG TABS Take 1 tablet by mouth 2 (two) times daily. 180 tablet 0   diphenoxylate-atropine (LOMOTIL) 2.5-0.025 MG tablet Take 1 tablet by mouth 4 (four) times daily as needed for diarrhea or loose stools. (Patient not taking: Reported on 11/19/2020) 40 tablet 0   DULoxetine (CYMBALTA) 30 MG capsule Take 1 capsule (30 mg total) by mouth daily. 30 capsule 3   gabapentin (NEURONTIN) 600 MG tablet Take 1 tablet (600 mg total) by mouth 3 (three) times daily. 90 tablet 3   lidocaine-prilocaine (EMLA) cream Apply 1 application topically as needed. Apply small amount to port site at least 1 hour prior to it being accessed, cover with plastic wrap 30 g 3    LORazepam (ATIVAN) 0.5 MG tablet Take 1 tablet (0.5 mg total) by mouth every 6 (six) hours as needed for anxiety. AS NEEDED FOR NAUSEA 45 tablet 0   oxyCODONE (OXY IR/ROXICODONE) 5 MG immediate release tablet Take 1-2 tablets (5-10 mg total) by mouth every 4 (four) hours as needed. 90 tablet 0   oxyCODONE (OXYCONTIN) 10 mg 12 hr tablet Take 1 tablet (10 mg total) by mouth every 12 (twelve) hours. 60 tablet 0   polyethylene glycol powder (MIRALAX) 17 GM/SCOOP powder Take 17 g by mouth daily as needed. 238 g 3   prochlorperazine (COMPAZINE) 10 MG tablet Take 1 tablet (10 mg total) by mouth every 6 (six) hours as needed for nausea or vomiting. 12 tablet 0   No current facility-administered medications for this visit.   Facility-Administered Medications Ordered in Other Visits  Medication Dose Route Frequency Provider Last Rate Last Admin   0.9 %  sodium chloride infusion   Intravenous Continuous Jacquelin Hawking, NP   Stopped at 06/02/20 1453   0.9 %  sodium chloride infusion   Intravenous Continuous Tricha Ruggirello, Kirt Boys, NP       heparin lock flush 100 unit/mL  500 Units Intravenous Once Sindy Guadeloupe, MD       heparin lock flush 100 unit/mL  500  Units Intravenous Once Sindy Guadeloupe, MD       heparin lock flush 100 unit/mL  500 Units Intravenous Once Lucille Crichlow, Kirt Boys, NP       sodium chloride flush (NS) 0.9 % injection 10 mL  10 mL Intravenous Once Sindy Guadeloupe, MD       sodium chloride flush (NS) 0.9 % injection 10 mL  10 mL Intravenous Once Sindy Guadeloupe, MD       sodium chloride flush (NS) 0.9 % injection 10 mL  10 mL Intravenous Once Jehieli Brassell, Kirt Boys, NP        VITAL SIGNS: BP 115/80    Pulse 86    Temp 99 F (37.2 C) (Tympanic)    Resp 16    Wt 140 lb (63.5 kg)    SpO2 99%    BMI 25.61 kg/m  Filed Weights   04/27/21 1131  Weight: 140 lb (63.5 kg)    Estimated body mass index is 25.61 kg/m as calculated from the following:   Height as of 04/22/21: $RemoveBe'5\' 2"'FGgfakDDG$  (1.575 m).   Weight as of  this encounter: 140 lb (63.5 kg).  LABS: CBC:    Component Value Date/Time   WBC 6.1 04/27/2021 1130   HGB 13.5 04/27/2021 1130   HCT 40.0 04/27/2021 1130   PLT 477 (H) 04/27/2021 1130   MCV 87.9 04/27/2021 1130   NEUTROABS 4.4 04/27/2021 1130   LYMPHSABS 1.1 04/27/2021 1130   MONOABS 0.4 04/27/2021 1130   EOSABS 0.1 04/27/2021 1130   BASOSABS 0.1 04/27/2021 1130   Comprehensive Metabolic Panel:    Component Value Date/Time   NA 135 04/27/2021 1130   K 3.7 04/27/2021 1130   CL 106 04/27/2021 1130   CO2 23 04/27/2021 1130   BUN 10 04/27/2021 1130   CREATININE 0.52 04/27/2021 1130   GLUCOSE 96 04/27/2021 1130   CALCIUM 8.5 (L) 04/27/2021 1130   AST 26 04/27/2021 1130   ALT 23 04/27/2021 1130   ALKPHOS 75 04/27/2021 1130   BILITOT 0.4 04/27/2021 1130   PROT 7.9 04/27/2021 1130   ALBUMIN 3.8 04/27/2021 1130    RADIOGRAPHIC STUDIES: ECHOCARDIOGRAM COMPLETE  Result Date: 04/13/2021    ECHOCARDIOGRAM REPORT   Patient Name:   ELIDIA BONENFANT [facility] Date of Exam: 04/13/2021 Medical Rec #:  [id number]             Height:       62.0 in Accession #:    [id number]            Weight:       145.0 lb Date of Birth:  [birth year]             BSA:          1.667 m Patient Age:    60 years              BP:           123/81 mmHg Patient Gender: F                     HR:           75 bpm. Exam Location:  [facility] Procedure: 2D Echo, Cardiac Doppler and Color Doppler Indications:     C50.919 Metastatic breast cancer  History:         Patient has prior history of Echocardiogram examinations, most                  recent  01/05/2021. Anxiety, breast cancer, covid-19.  Sonographer:     Sherrie Sport Referring Phys:  [id number] [facility] C RAO Diagnosing Phys: Harrell Gave End MD IMPRESSIONS  1. Left ventricular ejection fraction, by estimation, is 60 to 65%. The left ventricle has normal function. Left ventricular endocardial border not optimally defined to evaluate regional wall motion. Indeterminate diastolic filling  due to E-A fusion.  2. Right ventricular systolic function is normal. The right ventricular size is normal. Tricuspid regurgitation signal is inadequate for assessing PA pressure.  3. The mitral valve is normal in structure. No evidence of mitral valve regurgitation.  4. The aortic valve is tricuspid. Aortic valve regurgitation is not visualized. No aortic stenosis is present. FINDINGS  Left Ventricle: Left ventricular ejection fraction, by estimation, is 60 to 65%. The left ventricle has normal function. Left ventricular endocardial border not optimally defined to evaluate regional wall motion. The left ventricular internal cavity size was normal in size. There is no left ventricular hypertrophy. Indeterminate diastolic filling due to E-A fusion. Right Ventricle: The right ventricular size is normal. No increase in right ventricular wall thickness. Right ventricular systolic function is normal. Tricuspid regurgitation signal is inadequate for assessing PA pressure. Left Atrium: Left atrial size was normal in size. Right Atrium: Right atrial size was normal in size. Pericardium: The pericardium was not well visualized. Mitral Valve: The mitral valve is normal in structure. No evidence of mitral valve regurgitation. MV peak gradient, 3.2 mmHg. The mean mitral valve gradient is 2.0 mmHg. Tricuspid Valve: The tricuspid valve is not well visualized. Tricuspid valve regurgitation is trivial. Aortic Valve: The aortic valve is tricuspid. Aortic valve regurgitation is not visualized. No aortic stenosis is present. Aortic valve mean gradient measures 2.0 mmHg. Aortic valve peak gradient measures 3.6 mmHg. Aortic valve area, by VTI measures 2.59 cm. Pulmonic Valve: The pulmonic valve was normal in structure. Pulmonic valve regurgitation is trivial. No evidence of pulmonic stenosis. Aorta: The aortic root is normal in size and structure. Pulmonary Artery: The pulmonary artery is of normal size. Venous: The inferior vena cava  was not well visualized. IAS/Shunts: The interatrial septum was not well visualized.  LEFT VENTRICLE PLAX 2D LVIDd:         3.50 cm   Diastology LVIDs:         2.00 cm   LV e' medial:    4.46 cm/s LV PW:         0.70 cm   LV E/e' medial:  12.8 LV IVS:        0.75 cm   LV e' lateral:   9.57 cm/s LVOT diam:     2.00 cm   LV E/e' lateral: 6.0 LV SV:         35 LV SV Index:   21 LVOT Area:     3.14 cm  RIGHT VENTRICLE RV Basal diam:  3.10 cm LEFT ATRIUM           Index        RIGHT ATRIUM           Index LA diam:      2.70 cm 1.62 cm/m   RA Area:     13.20 cm LA Vol (A4C): 21.9 ml 13.13 ml/m  RA Volume:   30.80 ml  18.47 ml/m  AORTIC VALVE                    PULMONIC VALVE AV Area (Vmax):    2.58 cm  PV Vmax:        0.79 m/s AV Area (Vmean):   2.49 cm     PV Vmean:       53.450 cm/s AV Area (VTI):     2.59 cm     PV VTI:         0.118 m AV Vmax:           94.70 cm/s   PV Peak grad:   2.5 mmHg AV Vmean:          64.300 cm/s  PV Mean grad:   1.5 mmHg AV VTI:            0.136 m      RVOT Peak grad: 3 mmHg AV Peak Grad:      3.6 mmHg AV Mean Grad:      2.0 mmHg LVOT Vmax:         77.70 cm/s LVOT Vmean:        50.900 cm/s LVOT VTI:          0.112 m LVOT/AV VTI ratio: 0.82  AORTA Ao Root diam: 2.77 cm MITRAL VALVE MV Area (PHT): 6.87 cm    SHUNTS MV Area VTI:   3.17 cm    Systemic VTI:  0.11 m MV Peak grad:  3.2 mmHg    Systemic Diam: 2.00 cm MV Mean grad:  2.0 mmHg    Pulmonic VTI:  0.124 m MV Vmax:       0.89 m/s MV Vmean:      58.2 cm/s MV Decel Time: 111 msec MV E velocity: 57.00 cm/s MV A velocity: 77.55 cm/s MV E/A ratio:  0.74 Christopher End MD Electronically signed by Nelva Bush MD Signature Date/Time: 04/13/2021/1:41:59 PM    Final     PERFORMANCE STATUS (ECOG) : 2 - Symptomatic, <50% confined to bed  Review of Systems Unless otherwise noted, a complete review of systems is negative.  Physical Exam General: NAD Pulmonary: Unlabored Extremities: no edema, no joint deformities Skin: no  rashes Neurological: Weakness but otherwise nonfocal  IMPRESSION: Joint visit today with social work.  Patient endorses nausea intermittently since her last chemo on 04/22/21.  Nausea has been improved with use of ondansetron, which patient has been taking twice daily.  Discussed increasing the 3 times daily and will refill ondansetron and prochlorperazine.  Labs grossly unremarkable but will proceed with IV fluids today.  I reviewed with patient her cancer staging and we discussed goals for treatment and prognosis in the context of future planning and medical decision making.  I strongly encouraged completion of ACP documents.  She says that she has a friend, Izola Price, who she has talked some her wishes.  Appreciate social work involvement.  Patient has recently moved and is having financial distress with difficulty affording her rent/bills.  PLAN: -Continue current scope of treatment -IV fluids -As needed antiemetics -ACP documents when patient returns -RTC 2 weeks or sooner if needed  Case discussed with Dr. Janese Banks   Patient expressed understanding and was in agreement with this plan. She also understands that She can call the clinic at any time with any questions, concerns, or complaints.     Time Total: 30 minutes  Visit consisted of counseling and education dealing with the complex and emotionally intense issues of symptom management and palliative care in the setting of serious and potentially life-threatening illness.Greater than 50%  of this time was spent counseling and coordinating care related to the above assessment and plan.  Signed by: Altha Harm, PhD, NP-C

## 2021-04-29 ENCOUNTER — Encounter: Payer: Self-pay | Admitting: Oncology

## 2021-04-29 ENCOUNTER — Other Ambulatory Visit: Payer: Self-pay

## 2021-04-29 ENCOUNTER — Telehealth: Payer: Self-pay | Admitting: Hospice and Palliative Medicine

## 2021-04-29 MED ORDER — NITROFURANTOIN MONOHYD MACRO 100 MG PO CAPS
100.0000 mg | ORAL_CAPSULE | Freq: Two times a day (BID) | ORAL | 0 refills | Status: DC
  Filled 2021-04-29: qty 14, 7d supply, fill #0

## 2021-04-29 NOTE — Telephone Encounter (Signed)
Discussed UA results with patient. As patient is symptomatic with dysuria, will start on abx - macrobid 100mg  BID x 7 days

## 2021-04-30 LAB — URINE CULTURE: Culture: 10000 — AB

## 2021-05-05 ENCOUNTER — Encounter: Payer: Self-pay | Admitting: Hospice and Palliative Medicine

## 2021-05-05 ENCOUNTER — Other Ambulatory Visit: Payer: Self-pay

## 2021-05-05 ENCOUNTER — Inpatient Hospital Stay (HOSPITAL_BASED_OUTPATIENT_CLINIC_OR_DEPARTMENT_OTHER): Payer: Self-pay | Admitting: Hospice and Palliative Medicine

## 2021-05-05 ENCOUNTER — Encounter: Payer: Self-pay | Admitting: Oncology

## 2021-05-05 VITALS — BP 118/89 | HR 100 | Temp 99.1°F | Resp 17 | Wt 140.0 lb

## 2021-05-05 DIAGNOSIS — R21 Rash and other nonspecific skin eruption: Secondary | ICD-10-CM

## 2021-05-05 DIAGNOSIS — R110 Nausea: Secondary | ICD-10-CM

## 2021-05-05 MED ORDER — ALBUTEROL SULFATE HFA 108 (90 BASE) MCG/ACT IN AERS
2.0000 | INHALATION_SPRAY | Freq: Four times a day (QID) | RESPIRATORY_TRACT | 2 refills | Status: DC | PRN
  Filled 2021-05-05: qty 8.5, 30d supply, fill #0
  Filled 2021-10-07: qty 8.5, 30d supply, fill #1

## 2021-05-05 MED ORDER — DOXYCYCLINE HYCLATE 100 MG PO TABS
100.0000 mg | ORAL_TABLET | Freq: Two times a day (BID) | ORAL | 0 refills | Status: DC
  Filled 2021-05-05: qty 14, 7d supply, fill #0

## 2021-05-05 NOTE — Progress Notes (Signed)
Symptom Management [place] at [facility] Telephone:([phone]) [phone] [phone]  Patient Care Team: Sindy Guadeloupe, MD as PCP - General (Oncology) Rico Junker, RN as Registered Nurse Theodore Demark, RN as Registered Nurse Sindy Guadeloupe, MD as Consulting Physician (Hematology and Oncology)   Name of the patient: Sophia [name]  [id number]  1980-11-11   Date of visit: 05/05/21  Reason for Consult: Sophia [name] is a 41 y.o. 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 MRI of the brain on 02/08/2021 revealed widespread metastatic disease to the brain with mild associated edema and small area of hemorrhage in the right frontal.  Patient is status post whole brain radiation.  She completed Taxol and is now on Herceptin and Perjeta maintenance.  Patient last saw Dr. Janese Banks on 04/22/2021 for cycle 27 maintenance Herceptin and Perjeta.  Patient also received Xgeva.  Plan is for repeat CT of the chest, abdomen, and pelvis and MRI of the brain, which are scheduled on 05/10/2021.  Patient presents to [facility] today for evaluation of a pruritic rash to her forehead.  She describes 2 days of a rash, intense itching, and somewhat tender to touch.  She is try not to scratch.  She denies rashes elsewhere.  She does report a previous acne rash on her cheeks and was given a cream for that which she has been using on her forehead with little improvement in symptoms.  She denies fever or chills.  She does have chronic nausea and reports that her antiemetics are ineffective.  She is asking for something stronger for nausea.  Denies any neurologic complaints. Denies recent fevers or illnesses. Denies any easy bleeding or bruising. Reports fair appetite and denies weight loss. Denies chest pain. Denies any vomiting, constipation, or diarrhea. Denies urinary complaints. Patient  offers no further specific complaints today.  PAST MEDICAL HISTORY: Past Medical History:  Diagnosis Date   Anxiety    Breast cancer ([place])    with mets   Cancer ([place])    Colitis    COVID-19 in immunocompromised patient ([place])    Family history of colon cancer    Vertigo     PAST SURGICAL HISTORY:  Past Surgical History:  Procedure Laterality Date   BREAST BIOPSY Left 08/14/2019   [place] bx of mass, coil marker, path pending   BREAST BIOPSY Left 08/14/2019   [place] bx of LN, hydromarker, path pending   BREAST BIOPSY Left 08/14/2019   affirm bx of calcs, x marker, path pending   ESOPHAGOGASTRODUODENOSCOPY (EGD) WITH PROPOFOL N/A 10/05/2019   Procedure: ESOPHAGOGASTRODUODENOSCOPY (EGD) WITH PROPOFOL;  Surgeon: Lin Landsman, MD;  Location: [place];  Service: Gastroenterology;  Laterality: N/A;   FLEXIBLE SIGMOIDOSCOPY N/A 10/05/2019   Procedure: FLEXIBLE SIGMOIDOSCOPY;  Surgeon: Lin Landsman, MD;  [facility] ENDOSCOPY;  Service: Gastroenterology;  Laterality: N/A;   INTRAMEDULLARY (IM) NAIL INTERTROCHANTERIC Right 09/01/2019   Procedure: INTRAMEDULLARY (IM) NAIL INTERTROCHANTRIC AND RADIOFREQUENCY ABLATION;  Surgeon: Hessie Knows, MD;  Location: [facility] ORS;  Service: Orthopedics;  Laterality: Right;   KYPHOPLASTY N/A 08/29/2019   Procedure: KYPHOPLASTY T6, L1,L4 ,  RADIOFREQUENCY ABLATION;  Surgeon: Hessie Knows, MD;  Location: [facility] ORS;  Service: Orthopedics;  Laterality: N/A;   KYPHOPLASTY Right 09/01/2019   Procedure: Right Sacral Radiofrequency Ablation and Cement Augmentation, Right sacrum and iliac crest;  Surgeon: Hessie Knows, MD;  Location: [facility] ORS;  Service: Orthopedics;  Laterality:  Right;   PORTA CATH INSERTION N/A 08/28/2019   Procedure: PORTA CATH INSERTION;  Surgeon: Algernon Huxley, MD;  Location: [place] CV LAB;  Service: Cardiovascular;  Laterality: N/A;    HEMATOLOGY/ONCOLOGY HISTORY:  Oncology History  Primary cancer of left breast with  metastasis to other site [facility])  08/28/2019 Initial Diagnosis   Primary cancer of left breast with metastasis to other site [facility])   01/21/2021 Cancer Staging   Staging form: Breast, AJCC 8th Edition - Clinical stage from 01/21/2021: Stage IV (cT2, cN1, cM1, G2, ER-, PR-, HER2+) - Signed by Sindy Guadeloupe, MD on 01/21/2021 Histologic grading system: 3 grade system    Metastatic breast cancer ([place])  08/29/2019 Initial Diagnosis   Metastatic breast cancer ([place])   09/04/2019 -  Chemotherapy   Patient is on Treatment Plan : BREAST Weekly Paclitaxel + Trastuzumab + Pertuzumab q21d      Genetic Testing   Negative genetic testing. No pathogenic variants identified on the [facility] CancerNext-Expanded+RNA panel. The report date is 04/13/2020.   The CancerNext-Expanded + RNAinsight gene panel offered by [employer] and includes sequencing and rearrangement analysis for the following 77 genes: IP, ALK, APC*, ATM*, AXIN2, BAP1, BARD1, BLM, BMPR1A, BRCA1*, BRCA2*, BRIP1*, CDC73, CDH1*,CDK4, CDKN1B, CDKN2A, CHEK2*, CTNNA1, DICER1, FANCC, FH, FLCN, GALNT12, KIF1B, LZTR1, MAX, MEN1, MET, MLH1*, MSH2*, MSH3, MSH6*, MUTYH*, NBN, NF1*, NF2, NTHL1, PALB2*, PHOX2B, PMS2*, POT1, PRKAR1A, PTCH1, PTEN*, RAD51C*, RAD51D*,RB1, RECQL, RET, SDHA, SDHAF2, SDHB, SDHC, SDHD, SMAD4, SMARCA4, SMARCB1, SMARCE1, STK11, SUFU, TMEM127, TP53*,TSC1, TSC2, VHL and XRCC2 (sequencing and deletion/duplication); EGFR, EGLN1, HOXB13, KIT, MITF, PDGFRA, POLD1 and POLE (sequencing only); EPCAM and GREM1 (deletion/duplication only). DNA and RNA analyses performed for * genes.     ALLERGIES:  has No Known Allergies.  MEDICATIONS:  Current Outpatient Medications  Medication Sig Dispense Refill   albuterol (VENTOLIN HFA) 108 (90 Base) MCG/ACT inhaler Inhale 2 puffs into the lungs every 6 (six) hours as needed for wheezing or shortness of breath. (Patient not taking: Reported on 11/19/2020) 8 g 2   Calcium Carb-Cholecalciferol 600-10 MG-MCG TABS  Take 1 tablet by mouth 2 (two) times daily. 180 tablet 0   diphenoxylate-atropine (LOMOTIL) 2.5-0.025 MG tablet Take 1 tablet by mouth 4 (four) times daily as needed for diarrhea or loose stools. (Patient not taking: Reported on 11/19/2020) 40 tablet 0   DULoxetine (CYMBALTA) 30 MG capsule Take 1 capsule (30 mg total) by mouth daily. 30 capsule 3   gabapentin (NEURONTIN) 600 MG tablet Take 1 tablet (600 mg total) by mouth 3 (three) times daily. 90 tablet 3   lidocaine-prilocaine (EMLA) cream Apply 1 application topically as needed. Apply small amount to port site at least 1 hour prior to it being accessed, cover with plastic wrap 30 g 3   LORazepam (ATIVAN) 0.5 MG tablet Take 1 tablet (0.5 mg total) by mouth every 6 (six) hours as needed for anxiety. AS NEEDED FOR NAUSEA 45 tablet 0   nitrofurantoin, macrocrystal-monohydrate, (MACROBID) 100 MG capsule Take 1 capsule (100 mg total) by mouth 2 (two) times daily. 14 capsule 0   ondansetron (ZOFRAN) 8 MG tablet Take 1 tablet (8 mg total) by mouth every 8 (eight) hours as needed for nausea or vomiting. 45 tablet 2   oxyCODONE (OXY IR/ROXICODONE) 5 MG immediate release tablet Take 1-2 tablets (5-10 mg total) by mouth every 4 (four) hours as needed. 90 tablet 0   oxyCODONE (OXYCONTIN) 10 mg 12 hr tablet Take 1 tablet (10 mg total) by  mouth every 12 (twelve) hours. 60 tablet 0   polyethylene glycol powder (MIRALAX) 17 GM/SCOOP powder Take 17 g by mouth daily as needed. 238 g 3   prochlorperazine (COMPAZINE) 10 MG tablet Take 1 tablet (10 mg total) by mouth every 6 (six) hours as needed for nausea or vomiting. 12 tablet 0   No current facility-administered medications for this visit.   Facility-Administered Medications Ordered in Other Visits  Medication Dose Route Frequency Provider Last Rate Last Admin   0.9 %  sodium chloride infusion   Intravenous Continuous Jacquelin Hawking, NP   Stopped at 06/02/20 1453   heparin lock flush 100 unit/mL  500 Units  Intravenous Once Sindy Guadeloupe, MD       heparin lock flush 100 unit/mL  500 Units Intravenous Once Sindy Guadeloupe, MD       sodium chloride flush (NS) 0.9 % injection 10 mL  10 mL Intravenous Once Sindy Guadeloupe, MD       sodium chloride flush (NS) 0.9 % injection 10 mL  10 mL Intravenous Once Sindy Guadeloupe, MD        VITAL SIGNS: There were no vitals taken for this visit. There were no vitals filed for this visit.  Estimated body mass index is 25.61 kg/m as calculated from the following:   Height as of 04/22/21: $RemoveBe'5\' 2"'jkFbvuyhr$  (1.575 m).   Weight as of 04/27/21: 140 lb (63.5 kg).  LABS: CBC:    Component Value Date/Time   WBC 6.1 04/27/2021 1130   HGB 13.5 04/27/2021 1130   HCT 40.0 04/27/2021 1130   PLT 477 (H) 04/27/2021 1130   MCV 87.9 04/27/2021 1130   NEUTROABS 4.4 04/27/2021 1130   LYMPHSABS 1.1 04/27/2021 1130   MONOABS 0.4 04/27/2021 1130   EOSABS 0.1 04/27/2021 1130   BASOSABS 0.1 04/27/2021 1130   Comprehensive Metabolic Panel:    Component Value Date/Time   NA 135 04/27/2021 1130   K 3.7 04/27/2021 1130   CL 106 04/27/2021 1130   CO2 23 04/27/2021 1130   BUN 10 04/27/2021 1130   CREATININE 0.52 04/27/2021 1130   GLUCOSE 96 04/27/2021 1130   CALCIUM 8.5 (L) 04/27/2021 1130   AST 26 04/27/2021 1130   ALT 23 04/27/2021 1130   ALKPHOS 75 04/27/2021 1130   BILITOT 0.4 04/27/2021 1130   PROT 7.9 04/27/2021 1130   ALBUMIN 3.8 04/27/2021 1130    RADIOGRAPHIC STUDIES: ECHOCARDIOGRAM COMPLETE  Result Date: 04/13/2021    ECHOCARDIOGRAM REPORT   Patient Name:   MADDYX VALLIE [facility] Date of Exam: 04/13/2021 Medical Rec #:  [id number]             Height:       62.0 in Accession #:    [id number]            Weight:       145.0 lb Date of Birth:  08/25/1980             BSA:          1.667 m Patient Age:    27 years              BP:           123/81 mmHg Patient Gender: F                     HR:           75 bpm. Exam Location:  [facility] Procedure: 2D Echo,  Cardiac Doppler and Color  Doppler Indications:     C50.919 Metastatic breast cancer  History:         Patient has prior history of Echocardiogram examinations, most                  recent 01/05/2021. Anxiety, breast cancer, covid-19.  Sonographer:     Sherrie Sport Referring Phys:  [id number] [facility] C RAO Diagnosing Phys: Harrell Gave End MD IMPRESSIONS  1. Left ventricular ejection fraction, by estimation, is 60 to 65%. The left ventricle has normal function. Left ventricular endocardial border not optimally defined to evaluate regional wall motion. Indeterminate diastolic filling due to E-A fusion.  2. Right ventricular systolic function is normal. The right ventricular size is normal. Tricuspid regurgitation signal is inadequate for assessing PA pressure.  3. The mitral valve is normal in structure. No evidence of mitral valve regurgitation.  4. The aortic valve is tricuspid. Aortic valve regurgitation is not visualized. No aortic stenosis is present. FINDINGS  Left Ventricle: Left ventricular ejection fraction, by estimation, is 60 to 65%. The left ventricle has normal function. Left ventricular endocardial border not optimally defined to evaluate regional wall motion. The left ventricular internal cavity size was normal in size. There is no left ventricular hypertrophy. Indeterminate diastolic filling due to E-A fusion. Right Ventricle: The right ventricular size is normal. No increase in right ventricular wall thickness. Right ventricular systolic function is normal. Tricuspid regurgitation signal is inadequate for assessing PA pressure. Left Atrium: Left atrial size was normal in size. Right Atrium: Right atrial size was normal in size. Pericardium: The pericardium was not well visualized. Mitral Valve: The mitral valve is normal in structure. No evidence of mitral valve regurgitation. MV peak gradient, 3.2 mmHg. The mean mitral valve gradient is 2.0 mmHg. Tricuspid Valve: The tricuspid valve is not well visualized. Tricuspid valve  regurgitation is trivial. Aortic Valve: The aortic valve is tricuspid. Aortic valve regurgitation is not visualized. No aortic stenosis is present. Aortic valve mean gradient measures 2.0 mmHg. Aortic valve peak gradient measures 3.6 mmHg. Aortic valve area, by VTI measures 2.59 cm. Pulmonic Valve: The pulmonic valve was normal in structure. Pulmonic valve regurgitation is trivial. No evidence of pulmonic stenosis. Aorta: The aortic root is normal in size and structure. Pulmonary Artery: The pulmonary artery is of normal size. Venous: The inferior vena cava was not well visualized. IAS/Shunts: The interatrial septum was not well visualized.  LEFT VENTRICLE PLAX 2D LVIDd:         3.50 cm   Diastology LVIDs:         2.00 cm   LV e' medial:    4.46 cm/s LV PW:         0.70 cm   LV E/e' medial:  12.8 LV IVS:        0.75 cm   LV e' lateral:   9.57 cm/s LVOT diam:     2.00 cm   LV E/e' lateral: 6.0 LV SV:         35 LV SV Index:   21 LVOT Area:     3.14 cm  RIGHT VENTRICLE RV Basal diam:  3.10 cm LEFT ATRIUM           Index        RIGHT ATRIUM           Index LA diam:      2.70 cm 1.62 cm/m   RA Area:     13.20 cm LA  Vol (A4C): 21.9 ml 13.13 ml/m  RA Volume:   30.80 ml  18.47 ml/m  AORTIC VALVE                    PULMONIC VALVE AV Area (Vmax):    2.58 cm     PV Vmax:        0.79 m/s AV Area (Vmean):   2.49 cm     PV Vmean:       53.450 cm/s AV Area (VTI):     2.59 cm     PV VTI:         0.118 m AV Vmax:           94.70 cm/s   PV Peak grad:   2.5 mmHg AV Vmean:          64.300 cm/s  PV Mean grad:   1.5 mmHg AV VTI:            0.136 m      RVOT Peak grad: 3 mmHg AV Peak Grad:      3.6 mmHg AV Mean Grad:      2.0 mmHg LVOT Vmax:         77.70 cm/s LVOT Vmean:        50.900 cm/s LVOT VTI:          0.112 m LVOT/AV VTI ratio: 0.82  AORTA Ao Root diam: 2.77 cm MITRAL VALVE MV Area (PHT): 6.87 cm    SHUNTS MV Area VTI:   3.17 cm    Systemic VTI:  0.11 m MV Peak grad:  3.2 mmHg    Systemic Diam: 2.00 cm MV Mean grad:   2.0 mmHg    Pulmonic VTI:  0.124 m MV Vmax:       0.89 m/s MV Vmean:      58.2 cm/s MV Decel Time: 111 msec MV E velocity: 57.00 cm/s MV A velocity: 77.55 cm/s MV E/A ratio:  0.74 Christopher End MD Electronically signed by Nelva Bush MD Signature Date/Time: 04/13/2021/1:41:59 PM    Final     PERFORMANCE STATUS (ECOG) : 1 - Symptomatic but completely ambulatory  Review of Systems Unless otherwise noted, a complete review of systems is negative.  Physical Exam General: NAD Cardiovascular: regular rate and rhythm Pulmonary: clear ant fields Abdomen: soft, nontender, + bowel sounds GU: no suprapubic tenderness Extremities: no edema, no joint deformities Skin: Erythematous, raised lesions forehead without purulence or drainage. Neurological: Weakness but otherwise nonfocal  Assessment and Plan- Patient is a 41 y.o. female including stage IV metastatic breast cancer with bone metastases on systemic chemotherapy. MRI of the brain on 02/08/2021 revealed widespread metastatic disease to the brain with mild associated edema and small area of hemorrhage in the right frontal.  Patient is status post whole brain radiation.  She completed Taxol and is now on Herceptin and Perjeta maintenance.  Patient presents to [facility] for evaluation of a rash  Rash -pruritic rash. Discussed with Dr. Janese Banks and will start on doxycycline.   Nausea -currently on Zofran/Comazine. Will check EKG when patient returns to clinic and can rotate to olanzapine.   Case and plan discussed with Dr. Janese Banks.   Patient expressed understanding and was in agreement with this plan. She also understands that She can call clinic at any time with any questions, concerns, or complaints.   Thank you for allowing me to participate in the care of this very pleasant patient.   Time Total: 15 minutes  Visit  consisted of counseling and education dealing with the complex and emotionally intense issues of symptom management in the setting of serious  illness.Greater than 50%  of this time was spent counseling and coordinating care related to the above assessment and plan.  Signed by: Altha Harm, PhD, NP-C

## 2021-05-05 NOTE — Progress Notes (Signed)
Patient here for oncology follow-up appointment, concerns of itching, rash and bump on face about 2 days and N/V

## 2021-05-09 ENCOUNTER — Encounter
Admission: RE | Admit: 2021-05-09 | Discharge: 2021-05-09 | Disposition: A | Discharge status: Home / Self Care | Payer: Self-pay | Source: Ambulatory Visit | Attending: Oncology | Admitting: Oncology

## 2021-05-09 DIAGNOSIS — C7951 Secondary malignant neoplasm of bone: Secondary | ICD-10-CM | POA: Insufficient documentation

## 2021-05-09 DIAGNOSIS — C7931 Secondary malignant neoplasm of brain: Secondary | ICD-10-CM | POA: Insufficient documentation

## 2021-05-09 DIAGNOSIS — C50919 Malignant neoplasm of unspecified site of unspecified female breast: Secondary | ICD-10-CM | POA: Insufficient documentation

## 2021-05-09 IMAGING — NM NM BONE WHOLE BODY
2 series · 12 of 12 positions shown · non-contrast
Comparison: Whole-body bone scan [DATE], right femoral series
[DATE], chest, abdomen and pelvis CT [DATE] and abdomen
pelvis CT [DATE].

CLINICAL DATA: Metastatic breast cancer.

EXAM:
NUCLEAR MEDICINE WHOLE BODY BONE SCAN
TECHNIQUE: Whole body anterior and posterior images were obtained approximately
3 hours after intravenous injection of radiopharmaceutical.
RADIOPHARMACEUTICALS:  22.90 mCi [GD] MDP IV

[Series 1000: statics · 2.40mm/px · 5 acquisitions, 10 frames shown]
[im 1/5]
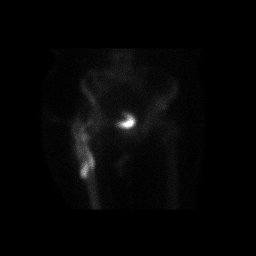
[im 1/5]
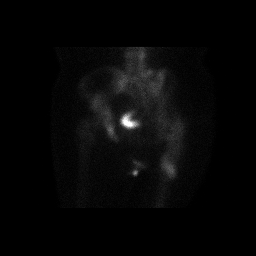
[im 2/5]
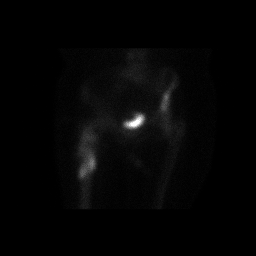
[im 2/5]
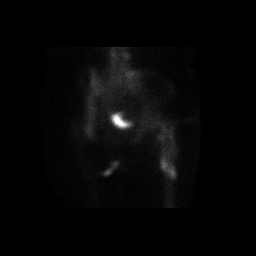
[im 3/5]
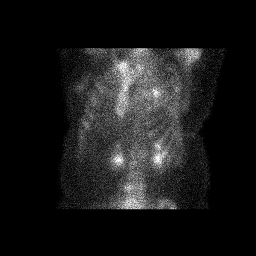
[im 3/5]
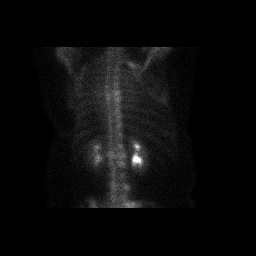
[im 4/5]
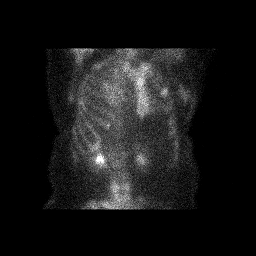
[im 4/5]
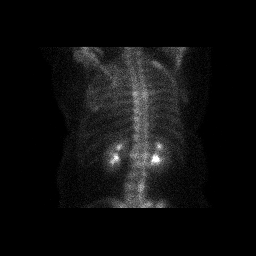
[im 5/5]
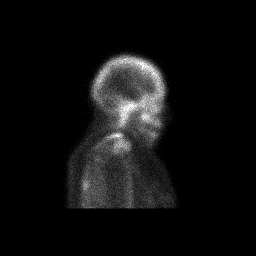
[im 5/5]
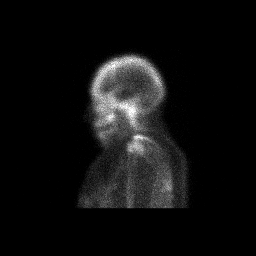

[Series 1000: 3 hr wholebody · 2.40mm/px · 2 of 2 frames shown]
[frame 1/2]
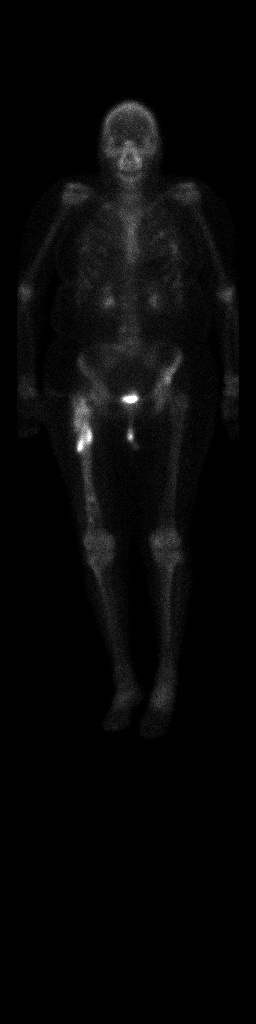
[frame 2/2]
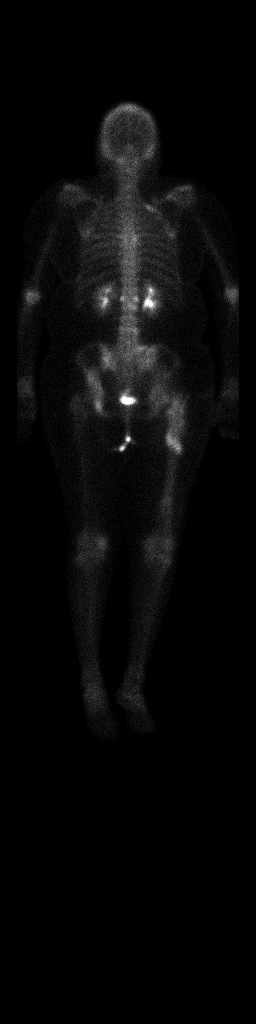

[12 of 12 positions shown; findings below may reference images not displayed]

FINDINGS: There is increased activity along proximal right femur particularly
on the anterior scans, suspected likely due to a combination of
partially healed fracture and myositis ossific cans. Underlying
metastatic disease would however, be difficult to exclude.

Additional activity in the distal shaft of the right femur is seen
consistent with the intramedullary hardware and securing screws
noted on plain radiographs.

Abnormal focal uptake in the left ilium is again noted and is
unchanged. Foci of lumbar activity consistent with prior kyphoplasty
are again noted.

There is additional mild activity in the anterior left third rib
consistent with a recently healed fracture. There is increased
activity in the posterior right second rib region which has been
seen previously and probably represents additional metastasis.

No other abnormal skeletal uptake is seen. The soft tissue and renal
activity are unremarkable.
IMPRESSION: 1. Abnormal activity in the posterior right second rib and left
ilium consistent with metastases, seen on prior bone scans.
2. Increased activity along the proximal right femur, suspected
related to partially healed fracture and/or myositis ossificans.
Underlying metastasis would be difficult to exclude.
3. Activity in the lumbar spine consistent with prior levels of
kyphoplasty.

## 2021-05-09 MED ORDER — TECHNETIUM TC 99M MEDRONATE IV KIT
22.9000 | PACK | Freq: Once | INTRAVENOUS | Status: AC | PRN
  Administered 2021-05-09: 22.9 via INTRAVENOUS

## 2021-05-10 ENCOUNTER — Telehealth: Payer: Self-pay | Admitting: Hospice and Palliative Medicine

## 2021-05-10 ENCOUNTER — Other Ambulatory Visit: Payer: Self-pay

## 2021-05-10 ENCOUNTER — Ambulatory Visit
Admission: RE | Admit: 2021-05-10 | Discharge: 2021-05-10 | Disposition: A | Discharge status: Home / Self Care | Payer: Self-pay | Source: Ambulatory Visit | Attending: Oncology | Admitting: Oncology

## 2021-05-10 DIAGNOSIS — C7931 Secondary malignant neoplasm of brain: Secondary | ICD-10-CM | POA: Insufficient documentation

## 2021-05-10 DIAGNOSIS — C7951 Secondary malignant neoplasm of bone: Secondary | ICD-10-CM

## 2021-05-10 DIAGNOSIS — C50919 Malignant neoplasm of unspecified site of unspecified female breast: Secondary | ICD-10-CM

## 2021-05-10 IMAGING — MR MR HEAD WO/W CM
15 series · 48 of 48 positions shown · IV contrast (6ml Gadavist)
Comparison: [DATE]

CLINICAL DATA: Metastatic breast cancer

EXAM:
MRI HEAD WITHOUT AND WITH CONTRAST
TECHNIQUE: Multiplanar, multiecho pulse sequences of the brain and surrounding
structures were obtained without and with intravenous contrast.
CONTRAST:  6mL GADAVIST GADOBUTROL 1 MMOL/ML IV SOLN

[Series 5: ax dwi_tracew · axial · 3.0mm · 0.65mm/px · z∈[-72,+83]mm · 4 of 48 slices shown]
[im 1/48]
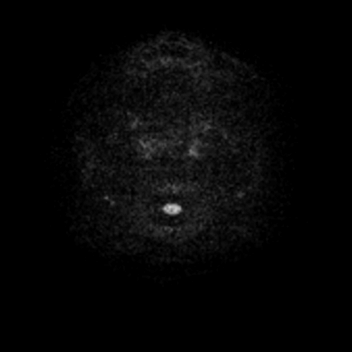
[im 16/48]
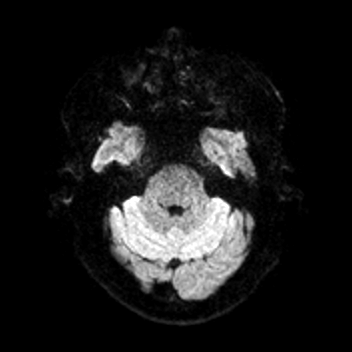
[im 32/48]
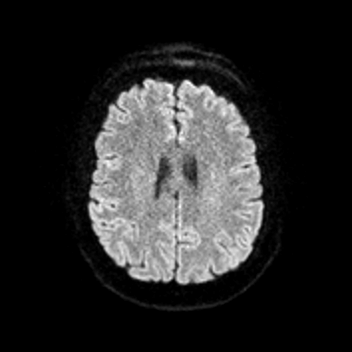
[im 48/48]
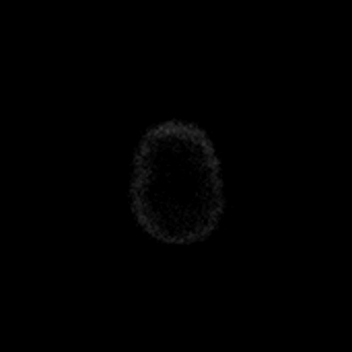

[Series 6: ax dwi_adc · axial · 3.0mm · 0.65mm/px · z∈[-72,+80]mm · 3 of 47 slices shown]
[im 1/47]
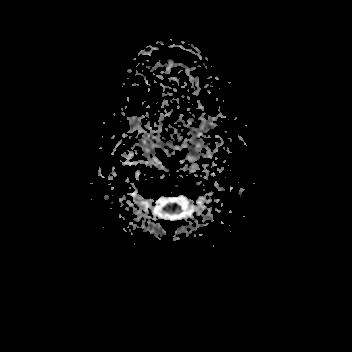
[im 24/47]
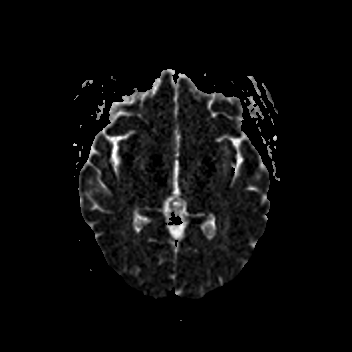
[im 47/47]
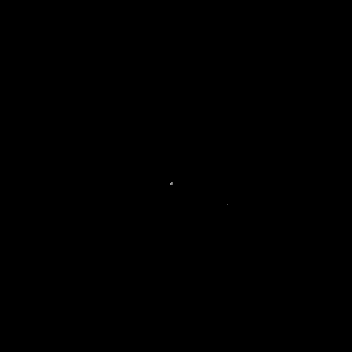

[Series 7: cor dwi_tracew · coronal · 5.0mm · 0.68mm/px · 2 of 40 slices shown]
[im 1/40]
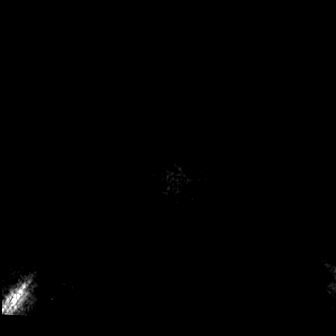
[im 40/40]
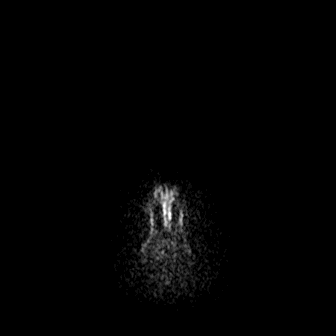

[Series 8: cor dwi_adc · coronal · 5.0mm · 0.68mm/px · 2 of 40 slices shown]
[im 1/40]
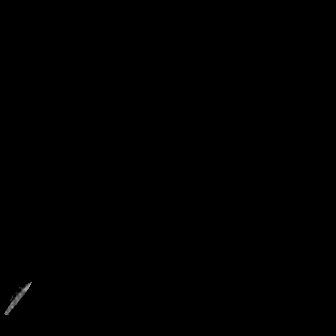
[im 40/40]
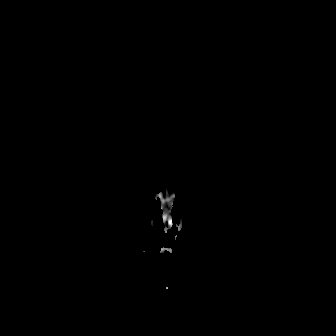

[Series 9: T1 · sagittal · 5.0mm · 0.62mm/px · 1 of 23 slices shown (1 of 2)]
[im 1/23]
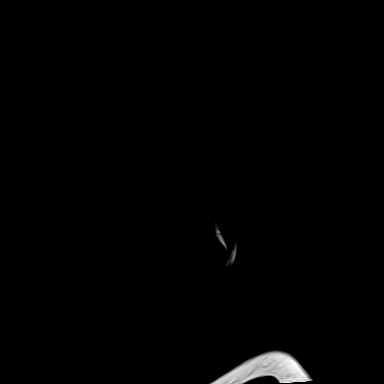

[Series 10: T2 · axial · 5.0mm · 0.53mm/px · 1 of 26 slices shown]
[im 1/26]
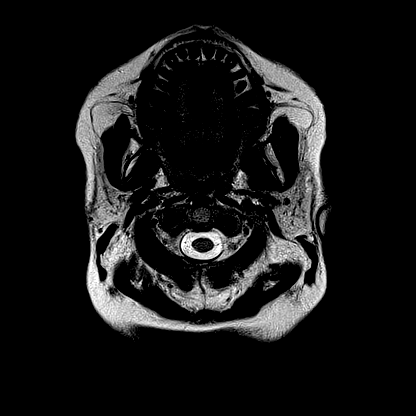

[Series 11: mag_images · axial · 3.0mm · 0.90mm/px · z∈[-83,+93]mm · 3 of 60 slices shown]
[im 1/60]
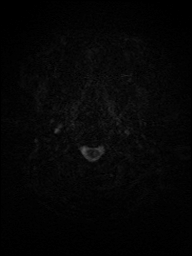
[im 30/60]
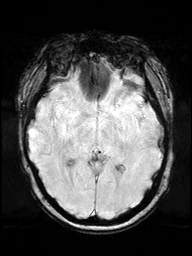
[im 60/60]
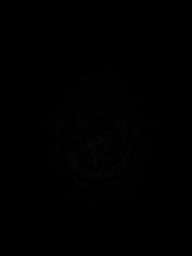

[Series 12: pha_images · axial · 3.0mm · 0.90mm/px · z∈[-83,+90]mm · 3 of 59 slices shown]
[im 1/59]
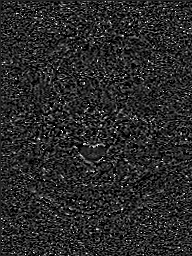
[im 30/59]
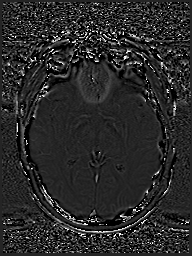
[im 59/59]
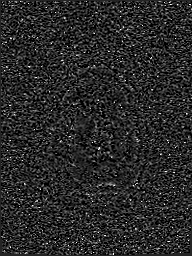

[Series 13: swi_images · axial · 3.0mm · 0.90mm/px · z∈[-83,+93]mm · 3 of 60 slices shown]
[im 1/60]
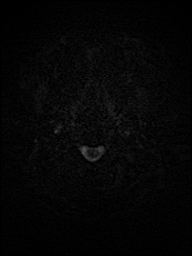
[im 30/60]
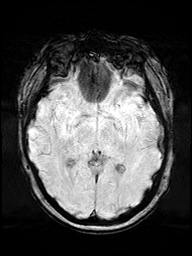
[im 60/60]
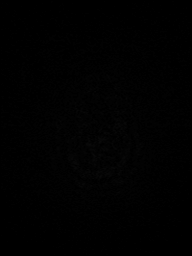

[Series 15: FLAIR · axial · 3.0mm · 0.53mm/px · z∈[-76,+86]mm · 3 of 55 slices shown]
[im 1/55]
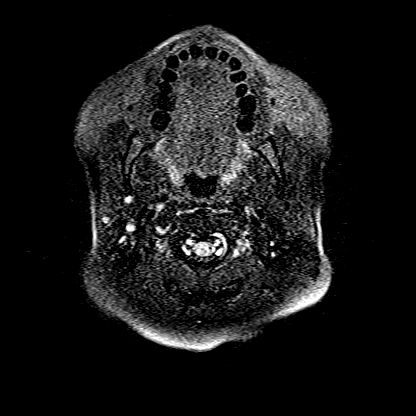
[im 28/55]
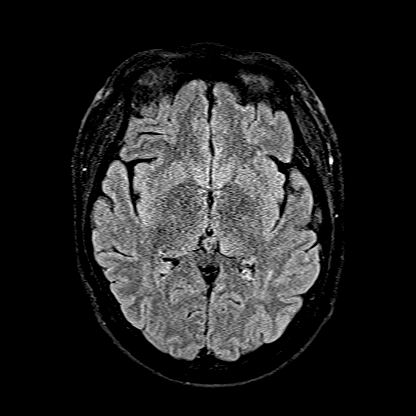
[im 55/55]
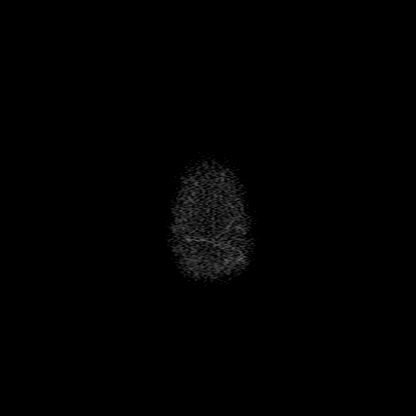

[Series 16: T1 · axial · 1.0mm · 0.98mm/px · z∈[-74,+101]mm · 9 of 174 slices shown (2 of 2)]
[im 1/174]
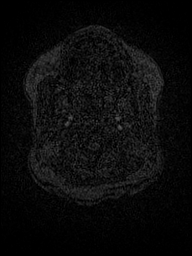
[im 22/174]
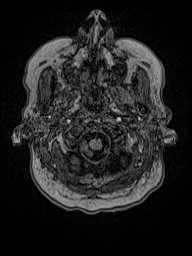
[im 44/174]
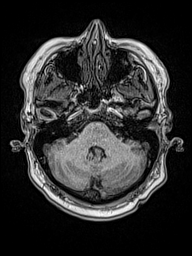
[im 65/174]
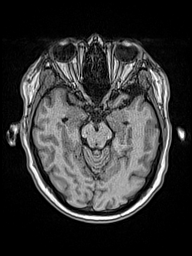
[im 87/174]
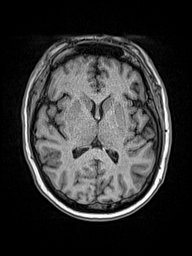
[im 109/174]
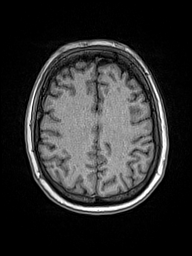
[im 130/174]
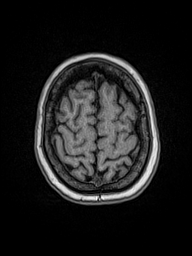
[im 152/174]
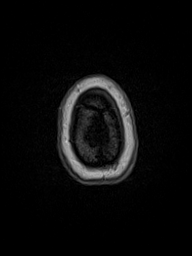
[im 174/174]
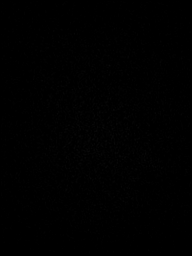

[Series 17: T2 post-contrast · coronal · 5.0mm · 0.57mm/px · 2 of 29 slices shown]
[im 1/29]
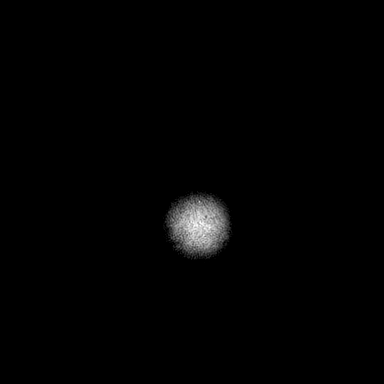
[im 29/29]
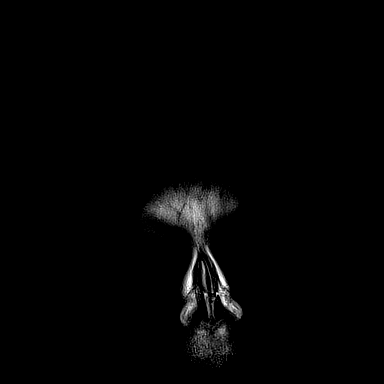

[Series 18: T1 post-contrast · coronal · 5.0mm · 0.57mm/px · 2 of 29 slices shown (1 of 3)]
[im 1/29]
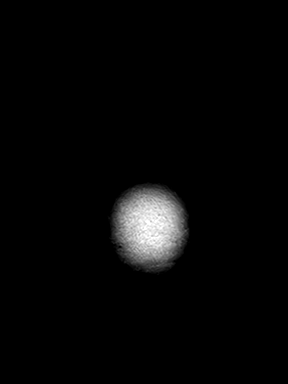
[im 29/29]
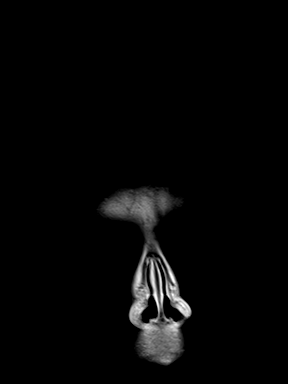

[Series 19: T1 post-contrast · axial · 1.0mm · 0.98mm/px · z∈[-74,+101]mm · 9 of 174 slices shown (2 of 3)]
[im 1/174]
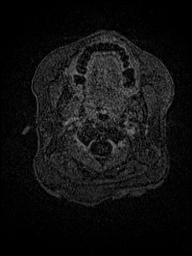
[im 22/174]
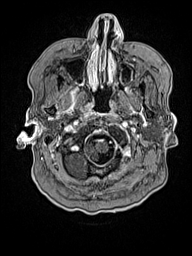
[im 44/174]
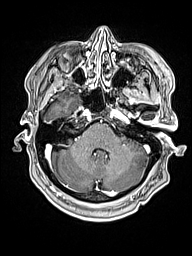
[im 65/174]
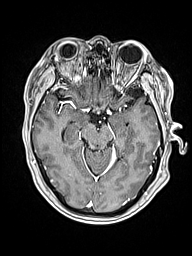
[im 87/174]
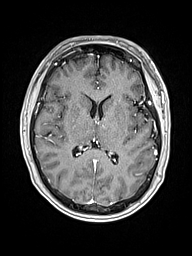
[im 109/174]
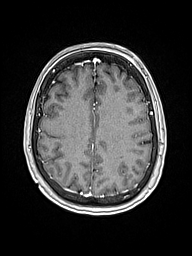
[im 130/174]
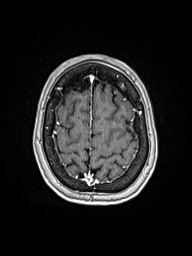
[im 152/174]
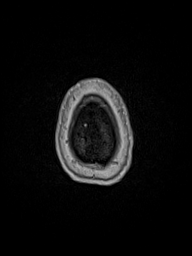
[im 174/174]
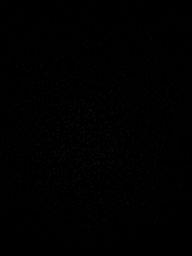

[Series 20: T1 post-contrast · sagittal · 5.0mm · 0.62mm/px · 1 of 23 slices shown (3 of 3)]
[im 1/23]
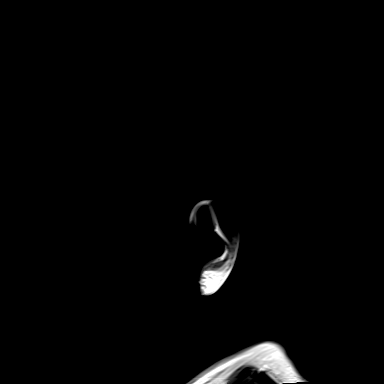

[48 of 48 positions shown; findings below may reference images not displayed]

FINDINGS: Brain: No restricted diffusion to suggest acute or subacute infarct.
No acute hemorrhage, mass effect, or midline shift.

Again noted are numerous enhancing lesions, which have decreased in
size.

Left parietal lesion measures 5 x 3 mm (series 19, image 141),
previously 11 x 10 mm.

Right frontal lesion measures 7 x 5 mm (series 19, image 136),
previously 11 x 9 mm.

Left anterior frontal lesion measures 4 x 4 mm (series 19, image
123), previously 8 x 6 mm.

Left parietal lesion measures 4 x 3 mm (series 19, image 118),
previously 8 x 6 mm.

Left frontal lesion measures 3 x 3 mm (series 19, image 111),
previously 6 x 5 mm.

Left inferior parietal lesion measures 6 x 3 mm (series 19, image
99), previously 12 x 8 mm.

Right frontal lesion measures 8 x 5 mm (series 19, image 93),
previously 14 x 10 mm.

Left inferior frontal lesion measures 9 x 6 mm (series 19, image
62), previously 15 x 14 mm.

Right posterior temporal lesion measures up to 2 mm (series 19,
image 65), previously 4 x 3 mm.

Right occipital lesion measures 4 x 3 mm (series 19, image 64),
previously 9 x 6 mm.

Right medial cerebellar lesion measures 5 x 5 mm (series 19, image
38), previously 8 x 7 mm.

Left posterior cerebellar lesion measures 7 x 4 mm (series 19, image
38), previously 8 x 5 mm.

Right lateral cerebellar lesion measures 4 x 2 mm (series 19, image
35), previously 5 x 3 mm.

Left anterior cerebellar lesion measures up to 3 mm (series 19,
image 29), previously 5 x 4 mm.

The edema associated with these lesions has also decreased. No new
or enlarging lesions are seen. No hydrocephalus or extra-axial
collection. No meningeal enhancement.

Vascular: Normal flow voids.

Skull and upper cervical spine: Redemonstrated enhancing lesion in
the left frontal calvarium (series 19, image 19), which measures
approximately 7 x 3 mm (series 19, image 131). No new enhancing
lesions in the calvarium. Previously 7 x 3 mm.

Sinuses/Orbits: Mucosal thickening in the maxillary sinuses. The
orbits are unremarkable.

Other: Fluid in bilateral mastoid air cells.
IMPRESSION: 1. Interval decrease in the size of previously noted metastatic
lesions in the brain. No new or enlarging lesions. The edema
associated with these lesions has also decreased.
2. Unchanged enhancing lesion in the left frontal calvarium. No new
enhancing osseous lesions.

## 2021-05-10 IMAGING — CT CT CHEST-ABD-PELV W/ CM
3 of 5 series · 14 of 36 positions shown, 16 images · IV contrast (agent unspecified)
Comparison: Bone scan dated [DATE] and CT abdomen pelvis dated
[DATE].

CLINICAL DATA: Metastatic breast cancer.

EXAM:
CT CHEST, ABDOMEN, AND PELVIS WITH CONTRAST
TECHNIQUE: Multidetector CT imaging of the chest, abdomen and pelvis was
performed following the standard protocol during bolus
administration of intravenous contrast.

[Series 2: cap with · axial · 0.69mm/px · z∈[-983,-513]mm · 9 of 118 slices shown, 11 images]
[im 12/118  mediastinal]
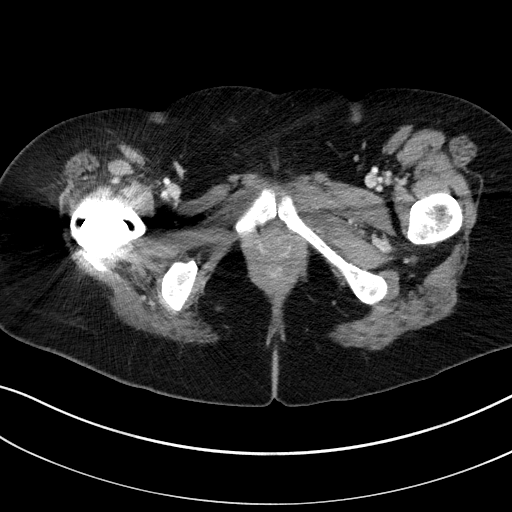
[im 12/118  bone]
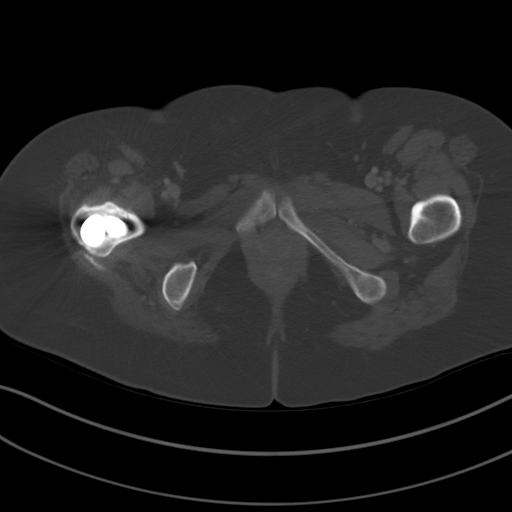
[im 24/118  mediastinal]
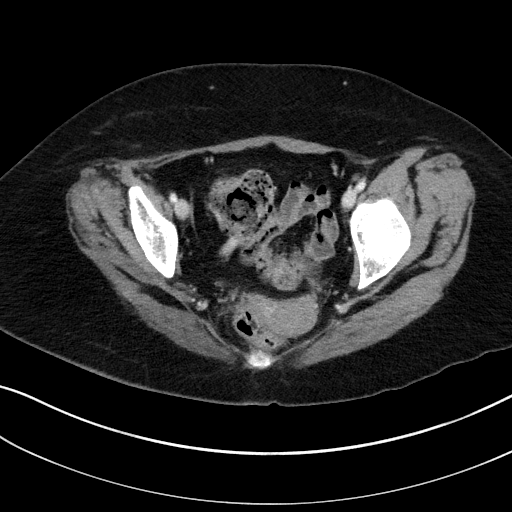
[im 36/118  mediastinal]
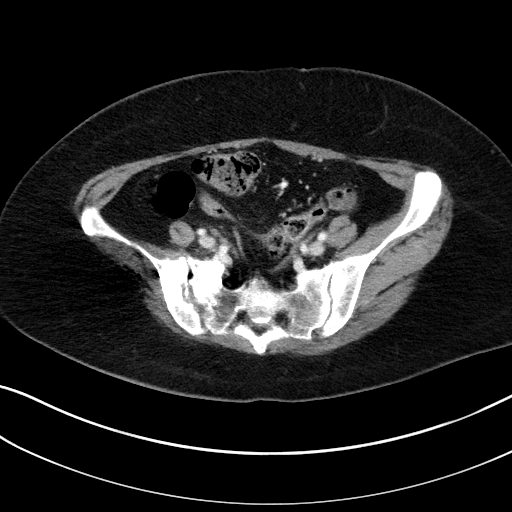
[im 47/118  mediastinal]
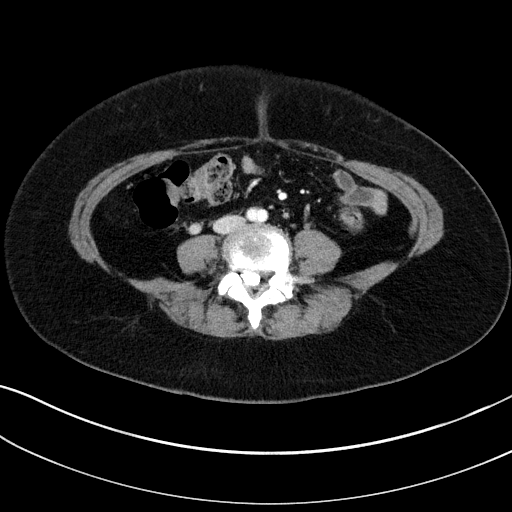
[im 59/118  mediastinal]
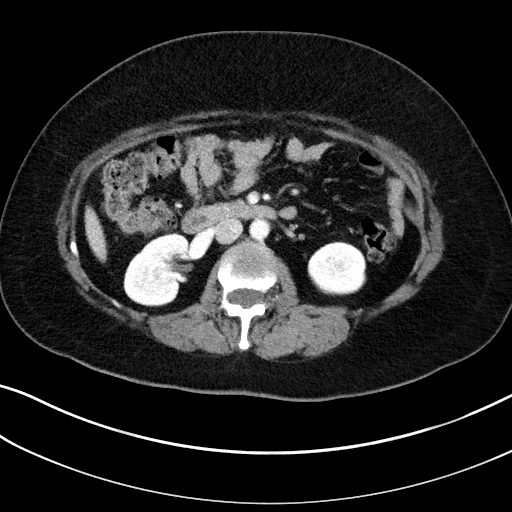
[im 71/118  mediastinal]
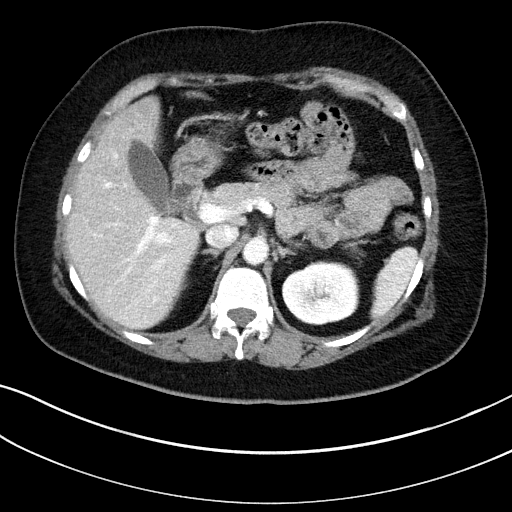
[im 82/118  mediastinal]
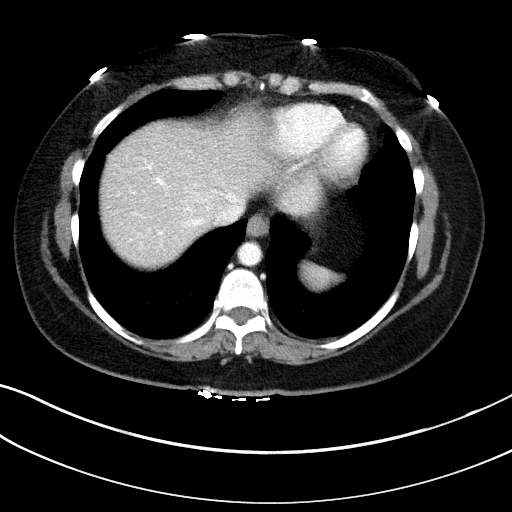
[im 94/118  mediastinal]
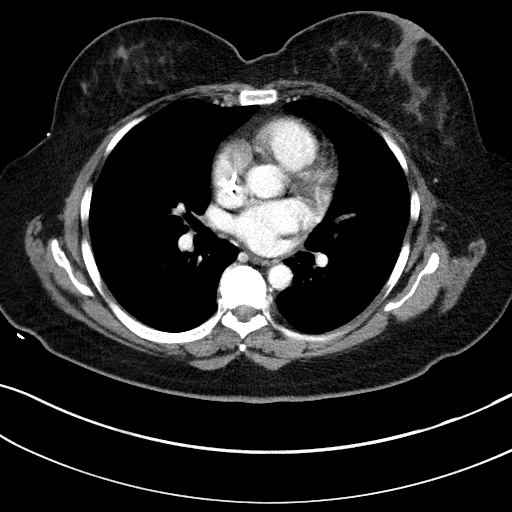
[im 106/118  mediastinal]
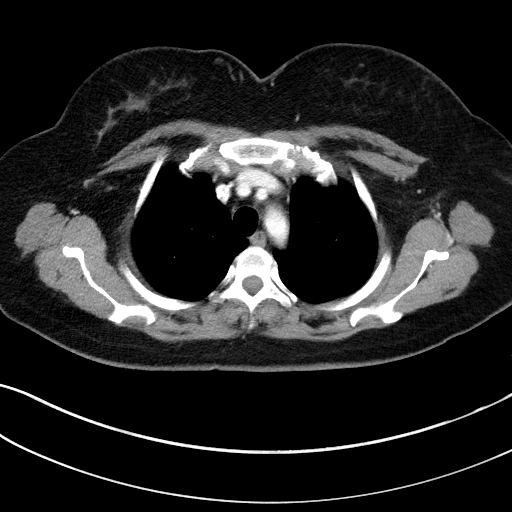
[im 106/118  bone]
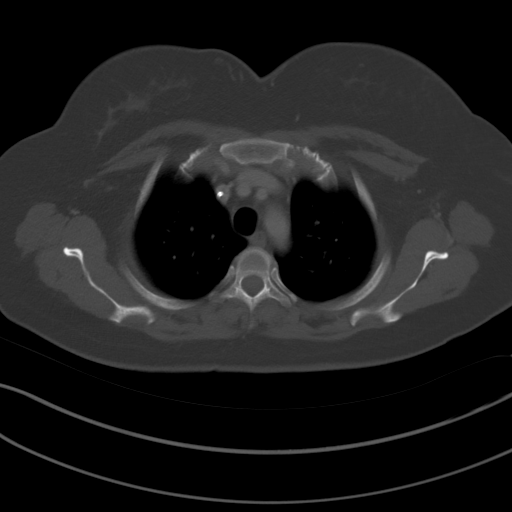

[Series 3: lung · axial · 0.69mm/px · z∈[-717,-673]mm · 2 of 143 slices shown]
[im 11/143  bone]
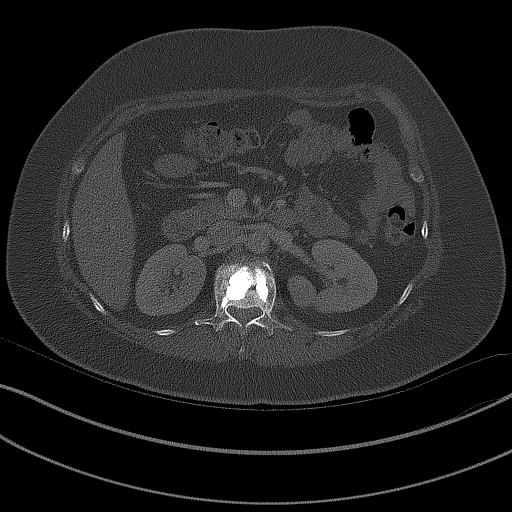
[im 33/143  bone]
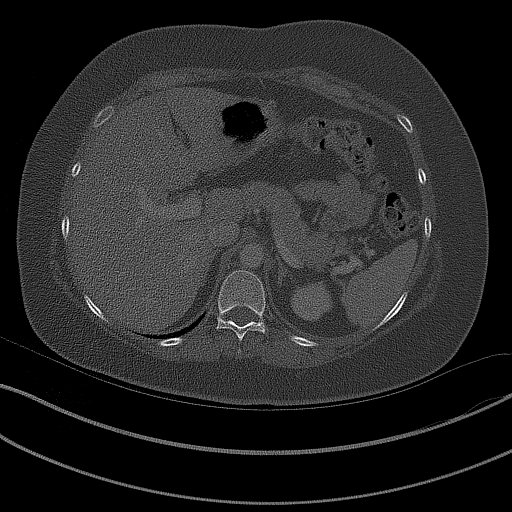

[Series 4: coronals · coronal · 0.76mm/px · 3 of 130 slices shown]
[im 26/130  mediastinal]
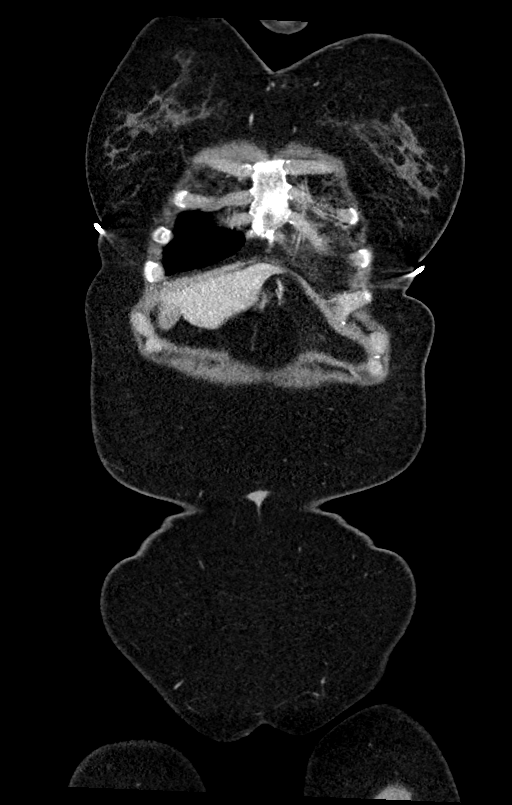
[im 52/130  mediastinal]
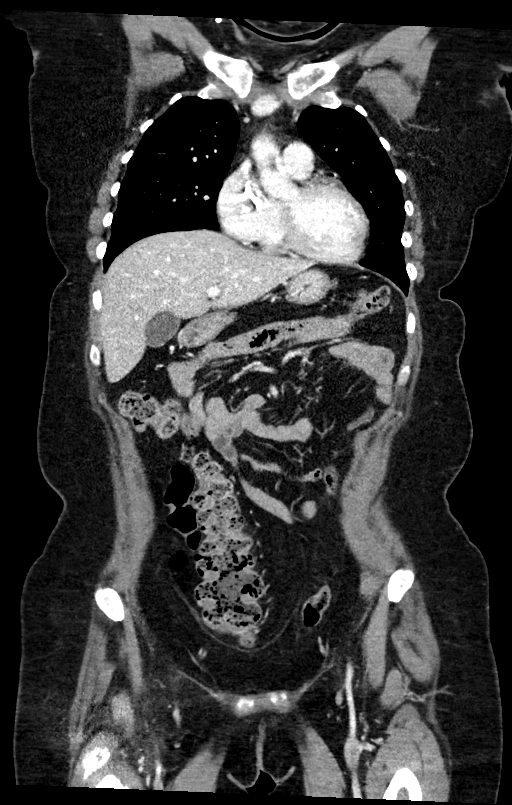
[im 78/130  mediastinal]
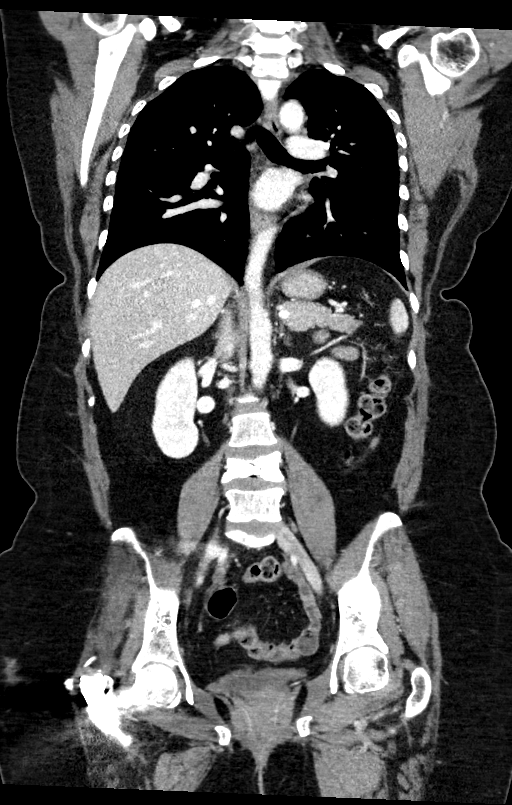

[14 of 36 positions shown; findings below may reference images not displayed]

RADIATION DOSE REDUCTION: This exam was performed according to the
departmental dose-optimization program which includes automated
exposure control, adjustment of the mA and/or kV according to
patient size and/or use of iterative reconstruction technique.

CONTRAST:  100mL OMNIPAQUE IOHEXOL 300 MG/ML  SOLN
FINDINGS: CT CHEST FINDINGS

Cardiovascular: There is no cardiomegaly or pericardial effusion.
The thoracic aorta is unremarkable. The origins of the great vessels
of the aortic arch and the central pulmonary arteries are patent.
Right-sided Port-A-Cath with tip in the right atrium.

Mediastinum/Nodes: No hilar or mediastinal adenopathy. The esophagus
and the thyroid gland are grossly unremarkable. No mediastinal fluid
collection.

Lungs/Pleura: The lungs are clear. There is no pleural effusion
pneumothorax. The central airways are patent.

Musculoskeletal: Old healed fracture of the anterior left third rib.
Sclerotic changes of the posterior right second rib ([DATE])
consistent with metastatic disease. Small osseous sclerotic lesions
involving the sternal manubrium, T4, T8, T10, and T12 vertebra
consistent with metastatic disease. There is old T6 compression
fracture and vertebroplasty. No acute osseous pathology.

CT ABDOMEN PELVIS FINDINGS

No intra-abdominal free air or free fluid.

Hepatobiliary: No focal liver abnormality is seen. No gallstones,
gallbladder wall thickening, or biliary dilatation.

Pancreas: Unremarkable. No pancreatic ductal dilatation or
surrounding inflammatory changes.

Spleen: Normal in size without focal abnormality.

Adrenals/Urinary Tract: Adrenal glands are unremarkable. Kidneys are
normal, without renal calculi, focal lesion, or hydronephrosis.
Bladder is unremarkable.

Stomach/Bowel: There is no bowel obstruction or active inflammation.
The appendix is normal.

Vascular/Lymphatic: The abdominal aorta and IVC are unremarkable. No
portal venous gas. No adenopathy.

Reproductive: The uterus and ovaries are grossly unremarkable.

Other: None

Musculoskeletal: Old L1 and L4 compression fractures and
vertebroplasty. Similar appearance of extrusion of vertebroplasty
cement into the central canal. Stable retropulsion or buckling of
the posterior cortices of L1 and L4 similar to prior CT with
associated mild to moderate narrowing of the central canal.
Sclerotic changes of the S1. Sclerotic changes of the superior
aspect of L2 similar to prior CT. Additional small scattered
sclerotic lesions appears similar to prior CT. Status post ORIF of
right femoral fracture. RONLOR material noted in the right sacral
al a and right iliac bone. Similar appearance of sclerotic changes
with cortical thickening of the left iliac bone. No acute osseous
pathology.
IMPRESSION: 1. No acute intrathoracic, abdominal, or pelvic pathology.
2. No significant interval change in the osseous metastatic disease.
No other evidence of metastasis within the chest, abdomen, or
pelvis.

## 2021-05-10 MED ORDER — GADOBUTROL 1 MMOL/ML IV SOLN
6.0000 mL | Freq: Once | INTRAVENOUS | Status: AC | PRN
  Administered 2021-05-10: 6 mL via INTRAVENOUS

## 2021-05-10 MED ORDER — IOHEXOL 300 MG/ML  SOLN
100.0000 mL | Freq: Once | INTRAMUSCULAR | Status: AC | PRN
  Administered 2021-05-10: 100 mL via INTRAVENOUS

## 2021-05-12 ENCOUNTER — Other Ambulatory Visit: Payer: Self-pay | Admitting: *Deleted

## 2021-05-13 ENCOUNTER — Inpatient Hospital Stay: Payer: Self-pay

## 2021-05-13 ENCOUNTER — Other Ambulatory Visit: Payer: Self-pay

## 2021-05-13 ENCOUNTER — Encounter: Payer: Self-pay | Admitting: Oncology

## 2021-05-13 ENCOUNTER — Inpatient Hospital Stay (HOSPITAL_BASED_OUTPATIENT_CLINIC_OR_DEPARTMENT_OTHER): Payer: Self-pay | Admitting: Hospice and Palliative Medicine

## 2021-05-13 ENCOUNTER — Other Ambulatory Visit: Payer: Self-pay | Admitting: *Deleted

## 2021-05-13 VITALS — BP 118/81 | HR 95 | Temp 98.3°F | Resp 16 | Wt 138.7 lb

## 2021-05-13 DIAGNOSIS — C50919 Malignant neoplasm of unspecified site of unspecified female breast: Secondary | ICD-10-CM

## 2021-05-13 DIAGNOSIS — R112 Nausea with vomiting, unspecified: Secondary | ICD-10-CM

## 2021-05-13 DIAGNOSIS — M79604 Pain in right leg: Secondary | ICD-10-CM

## 2021-05-13 DIAGNOSIS — Z515 Encounter for palliative care: Secondary | ICD-10-CM

## 2021-05-13 DIAGNOSIS — M8440XA Pathological fracture, unspecified site, initial encounter for fracture: Secondary | ICD-10-CM

## 2021-05-13 DIAGNOSIS — G893 Neoplasm related pain (acute) (chronic): Secondary | ICD-10-CM

## 2021-05-13 DIAGNOSIS — Z79899 Other long term (current) drug therapy: Secondary | ICD-10-CM

## 2021-05-13 DIAGNOSIS — Z5181 Encounter for therapeutic drug level monitoring: Secondary | ICD-10-CM

## 2021-05-13 MED ORDER — SODIUM CHLORIDE 0.9% FLUSH
10.0000 mL | INTRAVENOUS | Status: DC | PRN
  Filled 2021-05-13: qty 10

## 2021-05-13 MED ORDER — DULOXETINE HCL 60 MG PO CPEP
60.0000 mg | ORAL_CAPSULE | Freq: Every day | ORAL | 3 refills | Status: DC
  Filled 2021-05-13: qty 30, 30d supply, fill #0
  Filled 2021-06-13: qty 30, 30d supply, fill #1
  Filled 2021-07-15: qty 30, 30d supply, fill #2

## 2021-05-13 MED ORDER — ONDANSETRON HCL 4 MG/2ML IJ SOLN
INTRAMUSCULAR | Status: AC
  Administered 2021-05-13: 8 mg via INTRAVENOUS
  Filled 2021-05-13: qty 4

## 2021-05-13 MED ORDER — HEPARIN SOD (PORK) LOCK FLUSH 100 UNIT/ML IV SOLN
INTRAVENOUS | Status: AC
  Administered 2021-05-13: 500 [IU]
  Filled 2021-05-13: qty 5

## 2021-05-13 MED ORDER — HEPARIN SOD (PORK) LOCK FLUSH 100 UNIT/ML IV SOLN
500.0000 [IU] | Freq: Once | INTRAVENOUS | Status: AC | PRN
  Filled 2021-05-13: qty 5

## 2021-05-13 MED ORDER — MORPHINE SULFATE ER 15 MG PO TBCR
15.0000 mg | EXTENDED_RELEASE_TABLET | Freq: Two times a day (BID) | ORAL | 0 refills | Status: DC
Start: 2021-05-13 — End: 2021-06-10
  Filled 2021-05-13: qty 30, 15d supply, fill #0

## 2021-05-13 MED ORDER — TRASTUZUMAB-ANNS CHEMO 150 MG IV SOLR
6.0000 mg/kg | Freq: Once | INTRAVENOUS | Status: AC
  Administered 2021-05-13: 357 mg via INTRAVENOUS
  Filled 2021-05-13: qty 17

## 2021-05-13 MED ORDER — DIPHENHYDRAMINE HCL 50 MG/ML IJ SOLN
50.0000 mg | Freq: Once | INTRAMUSCULAR | Status: AC
  Administered 2021-05-13: 50 mg via INTRAVENOUS
  Filled 2021-05-13: qty 1

## 2021-05-13 MED ORDER — SODIUM CHLORIDE 0.9 % IV SOLN
420.0000 mg | Freq: Once | INTRAVENOUS | Status: AC
  Administered 2021-05-13: 420 mg via INTRAVENOUS
  Filled 2021-05-13: qty 14

## 2021-05-13 MED ORDER — TRASTUZUMAB-ANNS CHEMO 150 MG IV SOLR: 8.0000 mg/kg | Freq: Once | INTRAVENOUS | Status: DC

## 2021-05-13 MED ORDER — SODIUM CHLORIDE 0.9 % IV SOLN: 8.0000 mg | Freq: Once | INTRAVENOUS | Status: DC

## 2021-05-13 MED ORDER — OLANZAPINE 10 MG PO TABS
10.0000 mg | ORAL_TABLET | Freq: Every evening | ORAL | 2 refills | Status: DC | PRN
  Filled 2021-05-13: qty 30, 30d supply, fill #0

## 2021-05-13 MED ORDER — ONDANSETRON HCL 4 MG/2ML IJ SOLN: 8.0000 mg | Freq: Once | INTRAMUSCULAR | Status: AC

## 2021-05-13 MED ORDER — MORPHINE SULFATE (PF) 2 MG/ML IV SOLN
2.0000 mg | Freq: Once | INTRAVENOUS | Status: AC
  Administered 2021-05-13: 2 mg via INTRAVENOUS
  Filled 2021-05-13: qty 1

## 2021-05-13 MED ORDER — SODIUM CHLORIDE 0.9 % IV SOLN
Freq: Once | INTRAVENOUS | Status: AC
  Filled 2021-05-13: qty 250

## 2021-05-13 MED ORDER — ACETAMINOPHEN 325 MG PO TABS
650.0000 mg | ORAL_TABLET | Freq: Once | ORAL | Status: AC
  Administered 2021-05-13: 650 mg via ORAL
  Filled 2021-05-13: qty 2

## 2021-05-13 MED ORDER — OXYCODONE HCL ER 10 MG PO T12A
10.0000 mg | EXTENDED_RELEASE_TABLET | Freq: Two times a day (BID) | ORAL | 0 refills | Status: DC
  Filled 2021-05-13: qty 60, 30d supply, fill #0

## 2021-05-13 NOTE — Progress Notes (Signed)
Morphine given for 10/10 bilateral lower extremity pain after transferring to a stretcher for EKG. Pain was described as numbness and was intermittent. It helped to stand and move around. Following morphine, leg pain 0/10. However, then noted neuropathic pain/numbness in fingertips and right wrist. [name] Borders NP at chairside and notified.

## 2021-05-13 NOTE — Progress Notes (Addendum)
[place] at [facility] [phone] [phone]   Name: Sophia [name] Date: 05/13/2021 MRN: [id number]  DOB: 1980-08-28  Patient Care Team: Sindy Guadeloupe, MD as PCP - General (Oncology) Rico Junker, RN as Registered Nurse Theodore Demark, RN as Registered Nurse Sindy Guadeloupe, MD as Consulting Physician (Hematology and Oncology)    REASON FOR CONSULTATION: Sophia [name] is a 41 y.o. 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 MRI of the brain on 02/08/2021 revealed widespread metastatic disease to the brain with mild associated edema and small area of hemorrhage in the right frontal.  Patient is status post whole brain radiation.  She completed Taxol and is now on Herceptin and Perjeta maintenance. She was referred to palliative care to help address goals and manage ongoing symptoms  SOCIAL HISTORY:     reports that she has never smoked. She has never used smokeless tobacco. She reports that she does not currently use alcohol. She reports that she does not currently use drugs.  Patient is from [place].  She speaks [place].  She lives here with her 3 children ages 89-16.  She has no family or friend/social support locally.  She is unable to work.  ADVANCE DIRECTIVES:  Does not have  CODE STATUS:   PAST MEDICAL HISTORY: Past Medical History:  Diagnosis Date   Anxiety    Breast cancer ([place])    with mets   Cancer ([place])    Colitis    COVID-19 in immunocompromised patient ([place])    Family history of colon cancer    Vertigo     PAST SURGICAL HISTORY:  Past Surgical History:  Procedure Laterality Date   BREAST BIOPSY Left 08/14/2019   [place] bx of mass, coil marker, path pending   BREAST BIOPSY Left 08/14/2019   [place] bx of LN, hydromarker, path pending   BREAST BIOPSY Left 08/14/2019   affirm bx of calcs, x  marker, path pending   ESOPHAGOGASTRODUODENOSCOPY (EGD) WITH PROPOFOL N/A 10/05/2019   Procedure: ESOPHAGOGASTRODUODENOSCOPY (EGD) WITH PROPOFOL;  Surgeon: Lin Landsman, MD;  Location: [place];  Service: Gastroenterology;  Laterality: N/A;   FLEXIBLE SIGMOIDOSCOPY N/A 10/05/2019   Procedure: FLEXIBLE SIGMOIDOSCOPY;  Surgeon: Lin Landsman, MD;  Location: [facility] ENDOSCOPY;  Service: Gastroenterology;  Laterality: N/A;   INTRAMEDULLARY (IM) NAIL INTERTROCHANTERIC Right 09/01/2019   Procedure: INTRAMEDULLARY (IM) NAIL INTERTROCHANTRIC AND RADIOFREQUENCY ABLATION;  Surgeon: Hessie Knows, MD;  Location: [facility] ORS;  Service: Orthopedics;  Laterality: Right;   KYPHOPLASTY N/A 08/29/2019   Procedure: KYPHOPLASTY T6, L1,L4 ,  RADIOFREQUENCY ABLATION;  Surgeon: Hessie Knows, MD;  Location: [facility] ORS;  Service: Orthopedics;  Laterality: N/A;   KYPHOPLASTY Right 09/01/2019   Procedure: Right Sacral Radiofrequency Ablation and Cement Augmentation, Right sacrum and iliac crest;  Surgeon: Hessie Knows, MD;  Location: [facility] ORS;  Service: Orthopedics;  Laterality: Right;   PORTA CATH INSERTION N/A 08/28/2019   Procedure: PORTA CATH INSERTION;  Surgeon: Algernon Huxley, MD;  Location: [place] [facility];  Service: Cardiovascular;  Laterality: N/A;    HEMATOLOGY/ONCOLOGY HISTORY:  Oncology History  Primary cancer of left breast with metastasis to other site [facility])  08/28/2019 Initial Diagnosis   Primary cancer of left breast with metastasis to other site [facility])   01/21/2021 Cancer Staging   Staging form: Breast, AJCC 8th Edition - Clinical stage from 01/21/2021: Stage IV (cT2, cN1, cM1,  G2, ER-, PR-, HER2+) - Signed by Sindy Guadeloupe, MD on 01/21/2021 Histologic grading system: 3 grade system   Metastatic breast cancer ([place])  08/29/2019 Initial Diagnosis   Metastatic breast cancer ([place])   09/04/2019 -  Chemotherapy   Patient is on Treatment Plan : BREAST Weekly Paclitaxel + Trastuzumab + Pertuzumab  q21d      Genetic Testing   Negative genetic testing. No pathogenic variants identified on the [facility] CancerNext-Expanded+RNA panel. The report date is 04/13/2020.   The CancerNext-Expanded + RNAinsight gene panel offered by [employer] and includes sequencing and rearrangement analysis for the following 77 genes: IP, ALK, APC*, ATM*, AXIN2, BAP1, BARD1, BLM, BMPR1A, BRCA1*, BRCA2*, BRIP1*, CDC73, CDH1*,CDK4, CDKN1B, CDKN2A, CHEK2*, CTNNA1, DICER1, FANCC, FH, FLCN, GALNT12, KIF1B, LZTR1, MAX, MEN1, MET, MLH1*, MSH2*, MSH3, MSH6*, MUTYH*, NBN, NF1*, NF2, NTHL1, PALB2*, PHOX2B, PMS2*, POT1, PRKAR1A, PTCH1, PTEN*, RAD51C*, RAD51D*,RB1, RECQL, RET, SDHA, SDHAF2, SDHB, SDHC, SDHD, SMAD4, SMARCA4, SMARCB1, SMARCE1, STK11, SUFU, TMEM127, TP53*,TSC1, TSC2, VHL and XRCC2 (sequencing and deletion/duplication); EGFR, EGLN1, HOXB13, KIT, MITF, PDGFRA, POLD1 and POLE (sequencing only); EPCAM and GREM1 (deletion/duplication only). DNA and RNA analyses performed for * genes.     ALLERGIES:  has No Known Allergies.  MEDICATIONS:  Current Outpatient Medications  Medication Sig Dispense Refill   albuterol (VENTOLIN HFA) 108 (90 Base) MCG/ACT inhaler Inhale 2 puffs into the lungs every 6 (six) hours as needed for wheezing or shortness of breath. 8.5 g 2   Calcium Carb-Cholecalciferol 600-10 MG-MCG TABS Take 1 tablet by mouth 2 (two) times daily. 180 tablet 0   diphenoxylate-atropine (LOMOTIL) 2.5-0.025 MG tablet Take 1 tablet by mouth 4 (four) times daily as needed for diarrhea or loose stools. (Patient not taking: Reported on 11/19/2020) 40 tablet 0   doxycycline (VIBRA-TABS) 100 MG tablet Take 1 tablet (100 mg total) by mouth 2 (two) times daily. 14 tablet 0   DULoxetine (CYMBALTA) 30 MG capsule Take 1 capsule (30 mg total) by mouth daily. 30 capsule 3   gabapentin (NEURONTIN) 600 MG tablet Take 1 tablet (600 mg total) by mouth 3 (three) times daily. 90 tablet 3   lidocaine-prilocaine (EMLA) cream Apply 1  application topically as needed. Apply small amount to port site at least 1 hour prior to it being accessed, cover with plastic wrap 30 g 3   LORazepam (ATIVAN) 0.5 MG tablet Take 1 tablet (0.5 mg total) by mouth every 6 (six) hours as needed for anxiety. AS NEEDED FOR NAUSEA 45 tablet 0   nitrofurantoin, macrocrystal-monohydrate, (MACROBID) 100 MG capsule Take 1 capsule (100 mg total) by mouth 2 (two) times daily. 14 capsule 0   ondansetron (ZOFRAN) 8 MG tablet Take 1 tablet (8 mg total) by mouth every 8 (eight) hours as needed for nausea or vomiting. 45 tablet 2   oxyCODONE (OXY IR/ROXICODONE) 5 MG immediate release tablet Take 1-2 tablets (5-10 mg total) by mouth every 4 (four) hours as needed. 90 tablet 0   oxyCODONE (OXYCONTIN) 10 mg 12 hr tablet Take 1 tablet (10 mg total) by mouth every 12 (twelve) hours. 60 tablet 0   polyethylene glycol powder (MIRALAX) 17 GM/SCOOP powder Take 17 g by mouth daily as needed. 238 g 3   prochlorperazine (COMPAZINE) 10 MG tablet Take 1 tablet (10 mg total) by mouth every 6 (six) hours as needed for nausea or vomiting. 12 tablet 0   No current facility-administered medications for this visit.   Facility-Administered Medications Ordered in Other Visits  Medication Dose Route  Frequency Provider Last Rate Last Admin   0.9 %  sodium chloride infusion   Intravenous Continuous Jacquelin Hawking, NP   Stopped at 06/02/20 1453   heparin lock flush 100 UNIT/ML injection            heparin lock flush 100 unit/mL  500 Units Intravenous Once Sindy Guadeloupe, MD       heparin lock flush 100 unit/mL  500 Units Intravenous Once Sindy Guadeloupe, MD       heparin lock flush 100 unit/mL  500 Units Intracatheter Once PRN Sindy Guadeloupe, MD       pertuzumab (PERJETA) 420 mg in sodium chloride 0.9 % 250 mL chemo infusion  420 mg Intravenous Once Sindy Guadeloupe, MD       sodium chloride flush (NS) 0.9 % injection 10 mL  10 mL Intravenous Once Sindy Guadeloupe, MD       sodium chloride  flush (NS) 0.9 % injection 10 mL  10 mL Intravenous Once Sindy Guadeloupe, MD       sodium chloride flush (NS) 0.9 % injection 10 mL  10 mL Intracatheter PRN Sindy Guadeloupe, MD       trastuzumab-anns [facility]) 357 mg in sodium chloride 0.9 % 250 mL chemo infusion  6 mg/kg (Treatment Plan Recorded) Intravenous Once Sindy Guadeloupe, MD 534 mL/hr at 05/13/21 1433 357 mg at 05/13/21 1433    VITAL SIGNS: There were no vitals taken for this visit. There were no vitals filed for this visit.   Estimated body mass index is 25.36 kg/m as calculated from the following:   Height as of 04/22/21: $RemoveBe'5\' 2"'[id number]$  (1.575 m).   Weight as of an earlier encounter on 05/13/21: 138 lb 10.7 oz (62.9 kg).  LABS: CBC:    Component Value Date/Time   WBC 6.1 04/27/2021 1130   HGB 13.5 04/27/2021 1130   HCT 40.0 04/27/2021 1130   PLT 477 (H) 04/27/2021 1130   MCV 87.9 04/27/2021 1130   NEUTROABS 4.4 04/27/2021 1130   LYMPHSABS 1.1 04/27/2021 1130   MONOABS 0.4 04/27/2021 1130   EOSABS 0.1 04/27/2021 1130   BASOSABS 0.1 04/27/2021 1130   Comprehensive Metabolic Panel:    Component Value Date/Time   NA 135 04/27/2021 1130   K 3.7 04/27/2021 1130   CL 106 04/27/2021 1130   CO2 23 04/27/2021 1130   BUN 10 04/27/2021 1130   CREATININE 0.52 04/27/2021 1130   GLUCOSE 96 04/27/2021 1130   CALCIUM 8.5 (L) 04/27/2021 1130   AST 26 04/27/2021 1130   ALT 23 04/27/2021 1130   ALKPHOS 75 04/27/2021 1130   BILITOT 0.4 04/27/2021 1130   PROT 7.9 04/27/2021 1130   ALBUMIN 3.8 04/27/2021 1130    RADIOGRAPHIC STUDIES: MR Brain W Wo Contrast  Result Date: 05/11/2021 CLINICAL DATA:  Metastatic breast cancer EXAM: MRI HEAD WITHOUT AND WITH CONTRAST TECHNIQUE: Multiplanar, multiecho pulse sequences of the brain and surrounding structures were obtained without and with intravenous contrast. CONTRAST:  5mL GADAVIST GADOBUTROL 1 MMOL/ML IV SOLN COMPARISON:  02/09/2021 FINDINGS: Brain: No restricted diffusion to suggest acute or  subacute infarct. No acute hemorrhage, mass effect, or midline shift. Again noted are numerous enhancing lesions, which have decreased in size. Left parietal lesion measures 5 x 3 mm (series 19, image 141), previously 11 x 10 mm. Right frontal lesion measures 7 x 5 mm (series 19, image 136), previously 11 x 9 mm. Left anterior frontal lesion measures 4 x  4 mm (series 19, image 123), previously 8 x 6 mm. Left parietal lesion measures 4 x 3 mm (series 19, image 118), previously 8 x 6 mm. Left frontal lesion measures 3 x 3 mm (series 19, image 111), previously 6 x 5 mm. Left inferior parietal lesion measures 6 x 3 mm (series 19, image 99), previously 12 x 8 mm. Right frontal lesion measures 8 x 5 mm (series 19, image 93), previously 14 x 10 mm. Left inferior frontal lesion measures 9 x 6 mm (series 19, image 62), previously 15 x 14 mm. Right posterior temporal lesion measures up to 2 mm (series 19, image 65), previously 4 x 3 mm. Right occipital lesion measures 4 x 3 mm (series 19, image 64), previously 9 x 6 mm. Right medial cerebellar lesion measures 5 x 5 mm (series 19, image 38), previously 8 x 7 mm. Left posterior cerebellar lesion measures 7 x 4 mm (series 19, image 38), previously 8 x 5 mm. Right lateral cerebellar lesion measures 4 x 2 mm (series 19, image 35), previously 5 x 3 mm. Left anterior cerebellar lesion measures up to 3 mm (series 19, image 29), previously 5 x 4 mm. The edema associated with these lesions has also decreased. No new or enlarging lesions are seen. No hydrocephalus or extra-axial collection. No meningeal enhancement. Vascular: Normal flow voids. Skull and upper cervical spine: Redemonstrated enhancing lesion in the left frontal calvarium (series 19, image 19), which measures approximately 7 x 3 mm (series 19, image 131). No new enhancing lesions in the calvarium. Previously 7 x 3 mm. Sinuses/Orbits: Mucosal thickening in the maxillary sinuses. The orbits are unremarkable. Other: Fluid in  bilateral mastoid air cells. IMPRESSION: 1. Interval decrease in the size of previously noted metastatic lesions in the brain. No new or enlarging lesions. The edema associated with these lesions has also decreased. 2. Unchanged enhancing lesion in the left frontal calvarium. No new enhancing osseous lesions. Electronically Signed   By: Merilyn Baba M.D.   On: 05/11/2021 02:12   NM Bone Scan Whole Body  Result Date: 05/10/2021 CLINICAL DATA:  Metastatic breast cancer. EXAM: NUCLEAR MEDICINE WHOLE BODY BONE SCAN TECHNIQUE: Whole body anterior and posterior images were obtained approximately 3 hours after intravenous injection of radiopharmaceutical. RADIOPHARMACEUTICALS:  22.90 mCi Technetium-45m MDP IV COMPARISON:  Whole-body bone scan 01/19/2021, right femoral series 01/21/2021, chest, abdomen and pelvis CT 01/19/2021 and abdomen pelvis CT 03/14/2021. FINDINGS: There is increased activity along proximal right femur particularly on the anterior scans, suspected likely due to a combination of partially healed fracture and myositis ossific cans. Underlying metastatic disease would however, be difficult to exclude. Additional activity in the distal shaft of the right femur is seen consistent with the intramedullary hardware and securing screws noted on plain radiographs. Abnormal focal uptake in the left ilium is again noted and is unchanged. Foci of lumbar activity consistent with prior kyphoplasty are again noted. There is additional mild activity in the anterior left third rib consistent with a recently healed fracture. There is increased activity in the posterior right second rib region which has been seen previously and probably represents additional metastasis. No other abnormal skeletal uptake is seen. The soft tissue and renal activity are unremarkable. IMPRESSION: 1. Abnormal activity in the posterior right second rib and left ilium consistent with metastases, seen on prior bone scans. 2. Increased  activity along the proximal right femur, suspected related to partially healed fracture and/or myositis ossificans. Underlying metastasis would be difficult  to exclude. 3. Activity in the lumbar spine consistent with prior levels of kyphoplasty. Electronically Signed   By: Telford Nab M.D.   On: 05/10/2021 04:05   CT CHEST ABDOMEN PELVIS W CONTRAST  Result Date: 05/11/2021 CLINICAL DATA:  Metastatic breast cancer. EXAM: CT CHEST, ABDOMEN, AND PELVIS WITH CONTRAST TECHNIQUE: Multidetector CT imaging of the chest, abdomen and pelvis was performed following the standard protocol during bolus administration of intravenous contrast. RADIATION DOSE REDUCTION: This exam was performed according to the departmental dose-optimization program which includes automated exposure control, adjustment of the mA and/or kV according to patient size and/or use of iterative reconstruction technique. CONTRAST:  184mL OMNIPAQUE IOHEXOL 300 MG/ML  SOLN COMPARISON:  Bone scan dated 05/09/2021 and CT abdomen pelvis dated 03/14/2021. FINDINGS: CT CHEST FINDINGS Cardiovascular: There is no cardiomegaly or pericardial effusion. The thoracic aorta is unremarkable. The origins of the great vessels of the aortic arch and the central pulmonary arteries are patent. Right-sided Port-A-Cath with tip in the right atrium. Mediastinum/Nodes: No hilar or mediastinal adenopathy. The esophagus and the thyroid gland are grossly unremarkable. No mediastinal fluid collection. Lungs/Pleura: The lungs are clear. There is no pleural effusion pneumothorax. The central airways are patent. Musculoskeletal: Old healed fracture of the anterior left third rib. Sclerotic changes of the posterior right second rib (22/3) consistent with metastatic disease. Small osseous sclerotic lesions involving the sternal manubrium, T4, T8, T10, and T12 vertebra consistent with metastatic disease. There is old T6 compression fracture and vertebroplasty. No acute osseous  pathology. CT ABDOMEN PELVIS FINDINGS No intra-abdominal free air or free fluid. Hepatobiliary: No focal liver abnormality is seen. No gallstones, gallbladder wall thickening, or biliary dilatation. Pancreas: Unremarkable. No pancreatic ductal dilatation or surrounding inflammatory changes. Spleen: Normal in size without focal abnormality. Adrenals/Urinary Tract: Adrenal glands are unremarkable. Kidneys are normal, without renal calculi, focal lesion, or hydronephrosis. Bladder is unremarkable. Stomach/Bowel: There is no bowel obstruction or active inflammation. The appendix is normal. Vascular/Lymphatic: The abdominal aorta and IVC are unremarkable. No portal venous gas. No adenopathy. Reproductive: The uterus and ovaries are grossly unremarkable. Other: None Musculoskeletal: Old L1 and L4 compression fractures and vertebroplasty. Similar appearance of extrusion of vertebroplasty cement into the central canal. Stable retropulsion or buckling of the posterior cortices of L1 and L4 similar to prior CT with associated mild to moderate narrowing of the central canal. Sclerotic changes of the S1. Sclerotic changes of the superior aspect of L2 similar to prior CT. Additional small scattered sclerotic lesions appears similar to prior CT. Status post ORIF of right femoral fracture. Simmons material noted in the right sacral al a and right iliac bone. Similar appearance of sclerotic changes with cortical thickening of the left iliac bone. No acute osseous pathology. IMPRESSION: 1. No acute intrathoracic, abdominal, or pelvic pathology. 2. No significant interval change in the osseous metastatic disease. No other evidence of metastasis within the chest, abdomen, or pelvis. Electronically Signed   By: Anner Crete M.D.   On: 05/11/2021 01:15    PERFORMANCE STATUS (ECOG) : 2 - Symptomatic, <50% confined to bed  Review of Systems Unless otherwise noted, a complete review of systems is negative.  Physical  Exam General: NAD Pulmonary: Unlabored Extremities: no edema, no joint deformities Skin: no rashes Neurological: Weakness but otherwise nonfocal  IMPRESSION: Patient seen in infusion.  Patient endorses generalized pain as well as numbness/tingling to hands and feet.  Patient requested refill of OxyContin.  However, she is having some degree of financial distress  and has limited resources to pay for medications.  We will rotate to MS Contin for long-acting control.  Continue oxycodone IR as needed for breakthrough pain.  Discussed increasing dose of Cymbalta to 60 mg for neuropathic pain.  Patient reports appetite is poor.  She continues to have nausea unrelieved with Zofran/Compazine.  We will check EKG today for consideration of rotating her to olanzapine.  PLAN: -Continue current scope of treatment -As needed antiemetics -EKG  -Stop Compazine and start olanzapine prn for nausea -DC OxyContin and rotate MS Contin 15 mg every 12 hours due to cost -Increase Cymbalta to 60 mg daily -ACP documents previously discussed.  Referral to ACP clinic. -RTC 3 weeks   Patient expressed understanding and was in agreement with this plan. She also understands that She can call the clinic at any time with any questions, concerns, or complaints.     Time Total: 15 minutes  Visit consisted of counseling and education dealing with the complex and emotionally intense issues of symptom management and palliative care in the setting of serious and potentially life-threatening illness.Greater than 50%  of this time was spent counseling and coordinating care related to the above assessment and plan.  Signed by: Altha Harm, PhD, NP-C

## 2021-05-13 NOTE — Addendum Note (Signed)
Addended by: Irean Hong on: 05/13/2021 04:15 PM   Modules accepted: Orders

## 2021-05-13 NOTE — Patient Instructions (Signed)
[facility] CANCER CTR AT [place]  Discharge Instructions: Thank you for choosing [facility] to provide your oncology and hematology care.  If you have a lab appointment with the [facility], please go directly to the [facility] and check in at the registration area.  Wear comfortable clothing and clothing appropriate for easy access to any Portacath or PICC line.   We strive to give you quality time with your provider. You may need to reschedule your appointment if you arrive late (15 or more minutes).  Arriving late affects you and other patients whose appointments are after yours.  Also, if you miss three or more appointments without notifying the office, you may be dismissed from the clinic at the providers discretion.      For prescription refill requests, have your pharmacy contact our office and allow 72 hours for refills to be completed.    Today you received the following chemotherapy and/or immunotherapy agents - trastuzumab, pertuzumab      To help prevent nausea and vomiting after your treatment, we encourage you to take your nausea medication as directed.  BELOW ARE SYMPTOMS THAT SHOULD BE REPORTED IMMEDIATELY: *FEVER GREATER THAN 100.4 F (38 C) OR HIGHER *CHILLS OR SWEATING *NAUSEA AND VOMITING THAT IS NOT CONTROLLED WITH YOUR NAUSEA MEDICATION *UNUSUAL SHORTNESS OF BREATH *UNUSUAL BRUISING OR BLEEDING *URINARY PROBLEMS (pain or burning when urinating, or frequent urination) *BOWEL PROBLEMS (unusual diarrhea, constipation, pain near the anus) TENDERNESS IN MOUTH AND THROAT WITH OR WITHOUT PRESENCE OF ULCERS (sore throat, sores in mouth, or a toothache) UNUSUAL RASH, SWELLING OR PAIN  UNUSUAL VAGINAL DISCHARGE OR ITCHING   Items with * indicate a potential emergency and should be followed up as soon as possible or go to the Emergency Department if any problems should occur.  Please show the CHEMOTHERAPY ALERT CARD or IMMUNOTHERAPY ALERT CARD  at check-in to the Emergency Department and triage nurse.  Should you have questions after your visit or need to cancel or reschedule your appointment, please contact [facility] CANCER [place] AT [place]  [phone] and follow the prompts.  Office hours are 8:00 a.m. to 4:30 p.m. Monday - Friday. Please note that voicemails left after 4:00 p.m. may not be returned until the following business day.  We are closed weekends and major holidays. You have access to a nurse at all times for urgent questions. Please call the main number to the clinic [phone] and follow the prompts.  For any non-urgent questions, you may also contact your provider using MyChart. We now offer e-Visits for anyone 21 and older to request care online for non-urgent symptoms. For details visit [email or web address].   Also download the MyChart app! Go to the app store, search "MyChart", open the app, select [facility], and log in with your MyChart username and password.  Due to Covid, a mask is required upon entering the hospital/clinic. If you do not have a mask, one will be given to you upon arrival. For doctor visits, patients may have 1 support person aged 55 or older with them. For treatment visits, patients cannot have anyone with them due to current Covid guidelines and our immunocompromised population.   Trastuzumab injection for infusion Qu es este medicamento? El TRASTUZUMAB es un anticuerpo monoclonal. Genene Churn para tratar el cncer de mama y de [place]. Este medicamento puede ser utilizado para otros usos; si tiene alguna pregunta consulte con su proveedor de atencin mdica o con su farmacutico. MARCAS COMUNES: Herceptin, Belenda Cruise,  Brooke Bonito, Trazimera Qu le debo informar a mi profesional de la salud antes de tomar este medicamento? Necesitan saber si usted presenta alguno de los siguientes problemas o situaciones: enfermedad cardiaca insuficiencia cardiaca enfermedad  pulmonar o respiratoria, como asma una reaccin alrgica o inusual al trastuzumab, al alcohol benclico, a otros medicamentos, alimentos, colorantes o conservantes si est embarazada o buscando quedar embarazada si est amamantando a un beb Cmo debo utilizar este medicamento? Este medicamento se administra mediante infusin por va intravenosa. Lo administra un profesional de la salud calificado en un hospital o en un entorno clnico. Hable con su pediatra para informarse acerca del uso de este medicamento en nios. Este medicamento no est aprobado para uso en nios. Sobredosis: Pngase en contacto inmediatamente con un centro toxicolgico o una sala de urgencia si usted cree que haya tomado demasiado medicamento. ATENCIN: [employer] es solo para usted. No comparta este medicamento con nadie. Qu sucede si me olvido de una dosis? Es importante no olvidar ninguna dosis. Informe a su mdico o a su profesional de la salud si no puede asistir a Photographer. Qu puede interactuar con este medicamento? Este medicamento podra interactuar con los siguientes frmacos: ciertos tipos de quimioterapia, tales como daunorubicina, [place], [place], e idarubicina Puede ser que esta lista no menciona todas las posibles interacciones. Informe a su profesional de [employer]	[place] de [employer] productos a base de hierbas, medicamentos de [place] o suplementos nutritivos que est tomando. Si usted fuma, consume bebidas alcohlicas o si utiliza drogas ilegales, indqueselo tambin a su profesional de KB [employer]	[place]. Algunas sustancias pueden interactuar con su medicamento. A qu debo estar atento al usar Coca-Cola? Visite a su mdico para chequear su evolucin peridicamente. Si presenta algn efecto secundario, infrmelo. Sin embargo, contine con el tratamiento aun si se siente enfermo, a menos que su mdico le indique que lo suspenda. Consulte a su mdico o a su profesional de la salud si tiene  fiebre, escalofros, dolor de garganta, o cualquier otro sntoma de resfro o gripe. No se trate usted mismo. Trate de no acercarse a personas que estn enfermas. Es posible que tenga [place], escalofros y temblores durante su primera infusin. Estos efectos generalmente son leves y pueden tratarse con otros medicamentos. Informe cualquier efecto secundario durante la infusin a su profesional de [employer]	[place]. La fiebre y los escalofros por lo general no suceden con las infusiones posteriores. No debe quedar embarazada mientras est tomando este medicamento o por 7 meses despus de dejar de usarlo. Las mujeres deben informar a su mdico si estn buscando quedar embarazadas o si creen que estn embarazadas. Las mujeres con la posibilidad de Best boy nios deben tener una prueba de embarazo negativa antes de Art gallery manager a tomar este medicamento. Existe la posibilidad de que ocurran efectos secundarios graves a un beb sin nacer. Para ms informacin hable con su profesional de la salud o su farmacutico. No debe amamantar a un beb mientras est tomando este medicamento o por 7 meses despus de dejar de usarlo. Las [employer] deben usar un mtodo anticonceptivo eficaz con este medicamento. Qu efectos secundarios puedo tener al [employer] este medicamento? Efectos secundarios que debe informar a su mdico o a Barrister's clerk de la salud tan pronto como sea posible: Chief of Staff, como erupcin cutnea, comezn/picazn o urticaria, e hinchazn de la cara, los labios o la lengua dolor en el pecho o palpitaciones tos mareos sensacin de Youth worker o aturdimiento, cadas fiebre sensacin general de estar enfermo o sntomas gripales  signos de empeoramiento de la insuficiencia cardiaca, tales como problemas respiratorios; hinchazn en las piernas y los pies cansancio o debilidad inusual Efectos secundarios que generalmente no requieren atencin mdica (infrmelos a su mdico o a Barrister's clerk de la salud si persisten  o si son molestos): dolor de Psychiatric nurse sentido del gusto diarrea dolor en las articulaciones nuseas, vmito prdida de peso Puede ser que esta lista no menciona todos los posibles efectos secundarios. Comunquese a su mdico por asesoramiento mdico [employer]. Usted puede informar los efectos secundarios a la FDA por telfono al 1-800-FDA-1088. Dnde debo guardar mi medicina? Este medicamento se administra en hospitales o clnicas y no necesitar guardarlo en su domicilio. ATENCIN: Este folleto es un resumen. Puede ser que no cubra toda la posible informacin. Si usted tiene preguntas acerca de esta medicina, consulte con su mdico, su farmacutico o su profesional de Technical sales engineer.  2022 Elsevier/Gold Standard (2019-09-09 00:00:00)   Pertuzumab injection Qu es este medicamento? El PERTUZUMAB es un anticuerpo monoclonal. Genene Churn para tratar el cncer de mama. Este medicamento puede ser utilizado para otros usos; si tiene alguna pregunta consulte con su proveedor de atencin mdica o con su farmacutico. MARCAS COMUNES: PERJETA Qu le debo informar a mi profesional de la salud antes de tomar este medicamento? Necesita saber si usted presenta alguno de los siguientes problemas o situaciones: enfermedad cardiaca insuficiencia cardiaca alta presin sangunea antecedentes de pulso cardiaca irregular radioterapia reciente o continuada una reaccin alrgica o inusual al pertuzumab, a otros medicamentos, alimentos, colorantes o conservantes si est embarazada o buscando quedar embarazada si est amamantando a un beb Cmo debo utilizar este medicamento? Este medicamento se administra mediante infusin por va intravenosa. Lo administra un profesional de Technical sales engineer en un hospital o en un entorno clnico. Hable con su pediatra para informarse acerca del uso de este medicamento en nios. Puede requerir atencin especial. Sobredosis: Pngase en contacto  inmediatamente con un centro toxicolgico o una sala de urgencia si usted cree que haya tomado demasiado medicamento. ATENCIN: [employer] es solo para usted. No comparta este medicamento con nadie. Qu sucede si me olvido de una dosis? Es importante no olvidar ninguna dosis. Informe a su mdico o a su profesional de la salud si no puede asistir a Photographer. Qu puede interactuar con este medicamento? No se esperan interacciones. Puede ser que esta lista no menciona todas las posibles interacciones. Informe a su profesional de KB Home	[place] de [employer] productos a base de hierbas, medicamentos de [place] o suplementos nutritivos que est tomando. Si usted fuma, consume bebidas alcohlicas o si utiliza drogas ilegales, indqueselo tambin a su profesional de KB Home	[place]. Algunas sustancias pueden interactuar con su medicamento. A qu debo estar atento al usar Coca-Cola? Se supervisar su estado de salud atentamente mientras reciba este medicamento. Si presenta algn efecto secundario, infrmelo. Sin embargo, contine con el tratamiento aun si se siente enfermo, a menos que su mdico le indique que lo suspenda. No debe quedar embarazada mientras est tomando este medicamento o por 7 meses despus de dejar de usarlo. Las mujeres deben informar a su mdico si estn buscando quedar embarazadas o si creen que estn embarazadas. Las mujeres con la posibilidad de Best boy nios deben tener una prueba de embarazo negativa antes de Art gallery manager a tomar este medicamento. Existe la posibilidad de que ocurran efectos secundarios graves a un beb sin nacer. Para ms informacin hable con su profesional de la salud o su  farmacutico. No debe amamantar a un beb mientras est tomando este medicamento o por 7 meses despus de dejar de usarlo. Las [employer] deben usar un mtodo anticonceptivo eficaz con este medicamento. Consulte a su mdico o a su profesional de la salud si tiene fiebre, escalofros, dolor de garganta,  o cualquier otro sntoma de resfro o gripe. No se trate usted mismo. Trate de no acercarse a personas que estn enfermas. Es posible que tenga [place], escalofros y dolor de cabeza durante la infusin. Informe cualquier efecto secundario durante la infusin a su profesional de KB Home	[place]. Qu efectos secundarios puedo tener al [employer] este medicamento? Efectos secundarios que debe informar a su mdico o a Barrister's clerk de la salud tan pronto como sea posible: Arboriculturist o palpitaciones mareos sensacin de Youth worker o aturdimiento fiebre o escalofros erupcin cutnea, picazn o urticaria dolor de garganta hinchazn de la cara, labios o lengua hinchazn de piernas o tobillos cansancio o debilidad inusual Efectos secundarios que generalmente no requieren atencin mdica (infrmelos a su mdico o a su profesional de la salud si persisten o si son molestos): diarrea cada del cabello nuseas, vmito cansancio Puede ser que esta lista no menciona todos los posibles efectos secundarios. Comunquese a su mdico por asesoramiento mdico [employer]. Usted puede informar los efectos secundarios a la FDA por telfono al 1-800-FDA-1088. Dnde debo guardar mi medicina? Este medicamento se administra en hospitales o clnicas y no necesitar guardarlo en su domicilio. ATENCIN: Este folleto es un resumen. Puede ser que no cubra toda la posible informacin. Si usted tiene preguntas acerca de esta medicina, consulte con su mdico, su farmacutico o su profesional de Technical sales engineer.  2022 Elsevier/Gold Standard (2016-04-06 00:00:00)

## 2021-05-13 NOTE — Addendum Note (Signed)
Addended by: Irean Hong on: 05/13/2021 03:10 PM   Modules accepted: Orders

## 2021-05-16 ENCOUNTER — Encounter: Payer: Self-pay | Admitting: Licensed Clinical Social Worker

## 2021-05-17 ENCOUNTER — Encounter: Payer: Self-pay | Admitting: Oncology

## 2021-05-17 NOTE — Progress Notes (Signed)
[facility] CSW Progress Note  Clinical Education officer, museum contacted patient by phone to follow-up.  CSW contacted patient to find out if she had received any update on fund request from [employer].  Patient stated she had not received any financial assistance form them yet.  CSW inquired on how the patient is managing to pay her bills, patient stated she is not paying her bills and was able to pay rent in January with the assistance of her friends, but does not have any other source of income.  Patient stated she received assistance from [facility] with a water bill and electric bill but is late on current bills and rent for February and unable to pay for rent for March.  CSW asked patient is she had any plans in place incase they are evicted from the home or if they have the electricity or water cut off.  Patient stated she was grateful for my assistance but did not want to speak about these concerns any longer because it stresses her out.  Patient stated she thinks about these issues all the time but there is nothing she can do about them, so she would rather not talk about them.  CSW stated that although I empathized with her position on this matter, my main concern was the possibility that she and her children could become homeless, if something wasn't figured out about the lack of income and the inability to pay her bills and rent or if there wasn't a plan in place of where they could go if they were evicted from the home.  Patient stated she understood CSW was concerned but there was nothing she [the patient] could do because she could not work.  CSW asked if either of her sons had found a part-time job, patient stated her eldest son inquired about a job at [employer] last week but had not received a return call about the job. CSW stated that I would continue to look for financial assistance for her but it was not a guarantee I would be able to find financial assistance.  Patient stated she understood.  Patient  became emotional and stated she was not certain what she could do and she believed if she died her sons would be able to manage on their on.  CSW stated the boys were only 17 and 15 and would have a difficult time managing in their own and it was important to have some type plan incase she was unable to care for before they turned 18. Patient continued to cry softly but verbalized understanding. CSW asked patient is she would be able to stay with her friend if she was evicted.  Patient stated no, because her friend has two young children, is pregnant with her third, and lives with her husband and two brothers in-law. Patient stated she was amenable to signing Advanced Directives when she came for her next appointment on March 10th.  CSW stated I would find out if there was a notary and witnesses available and I would confirm with her by Wednesday 05/26/2021. Patient  verbalize understanding.  CSW asked patient if she was interested in attending the [place] Speaking Support group on Thursday 05/19/2021.  Patient stated she was interested but needed to confirm she was able to come.  CSW stated she could also join virtually if she was not able to attend in person.  Patient stated she was not well versed in doing virtual meetings. CSW asked if one of her children would be  able to assist her to set up virtual meeting, patient made excuses for the children not being able to help her with this.  CSW stated I would contact the patient on Wednesday 05/18/2021 to confirm attendance at meeting on Thursday. Patient verbalized understanding.  CSW is concerned about patient's economic status and the possibility she and her minor children 10,15,17 will be evicted from their home. Patient is having a very difficult time making decisions and plans for the children, if she were unable to care for them.  CSW's main concerns are for the 52 and 13 year old children, since the 35 year old has his father who will be able to take custody if  the patient is unable to care for the children due to complications from her illness. Patient is hopeful she will live another 3-4 years, but is aware this is not a guarantee. CSW is concerned patient has unrealistic expectations about what her friend will be able to assist with taking custody of her sons, since she stated her friend would be unable to take them in if the were evicted.  CSW will continue to support patient in processing and adjusting to diagnosis, and treatment.    Adelene Amas , LCSW

## 2021-05-27 ENCOUNTER — Other Ambulatory Visit: Payer: Self-pay

## 2021-06-03 ENCOUNTER — Other Ambulatory Visit: Payer: Self-pay | Admitting: Internal Medicine

## 2021-06-03 ENCOUNTER — Encounter: Payer: Self-pay | Admitting: Oncology

## 2021-06-03 ENCOUNTER — Inpatient Hospital Stay (HOSPITAL_BASED_OUTPATIENT_CLINIC_OR_DEPARTMENT_OTHER): Payer: Self-pay | Admitting: Hospice and Palliative Medicine

## 2021-06-03 ENCOUNTER — Inpatient Hospital Stay: Payer: Self-pay

## 2021-06-03 ENCOUNTER — Other Ambulatory Visit: Payer: Self-pay

## 2021-06-03 ENCOUNTER — Encounter: Payer: Self-pay | Admitting: Nurse Practitioner

## 2021-06-03 ENCOUNTER — Inpatient Hospital Stay: Payer: Self-pay | Attending: Nurse Practitioner

## 2021-06-03 ENCOUNTER — Inpatient Hospital Stay (HOSPITAL_BASED_OUTPATIENT_CLINIC_OR_DEPARTMENT_OTHER): Payer: Self-pay | Admitting: Nurse Practitioner

## 2021-06-03 VITALS — BP 108/82 | HR 79 | Temp 97.6°F | Wt 147.6 lb

## 2021-06-03 DIAGNOSIS — Z5111 Encounter for antineoplastic chemotherapy: Secondary | ICD-10-CM | POA: Insufficient documentation

## 2021-06-03 DIAGNOSIS — C50412 Malignant neoplasm of upper-outer quadrant of left female breast: Secondary | ICD-10-CM | POA: Insufficient documentation

## 2021-06-03 DIAGNOSIS — Z79899 Other long term (current) drug therapy: Secondary | ICD-10-CM

## 2021-06-03 DIAGNOSIS — C50919 Malignant neoplasm of unspecified site of unspecified female breast: Secondary | ICD-10-CM

## 2021-06-03 DIAGNOSIS — Z5112 Encounter for antineoplastic immunotherapy: Secondary | ICD-10-CM

## 2021-06-03 DIAGNOSIS — Z800 Family history of malignant neoplasm of digestive organs: Secondary | ICD-10-CM | POA: Insufficient documentation

## 2021-06-03 DIAGNOSIS — G893 Neoplasm related pain (acute) (chronic): Secondary | ICD-10-CM | POA: Insufficient documentation

## 2021-06-03 DIAGNOSIS — C7951 Secondary malignant neoplasm of bone: Secondary | ICD-10-CM

## 2021-06-03 DIAGNOSIS — Z515 Encounter for palliative care: Secondary | ICD-10-CM

## 2021-06-03 DIAGNOSIS — C7931 Secondary malignant neoplasm of brain: Secondary | ICD-10-CM | POA: Insufficient documentation

## 2021-06-03 DIAGNOSIS — Z5181 Encounter for therapeutic drug level monitoring: Secondary | ICD-10-CM

## 2021-06-03 DIAGNOSIS — Z171 Estrogen receptor negative status [ER-]: Secondary | ICD-10-CM | POA: Insufficient documentation

## 2021-06-03 DIAGNOSIS — R112 Nausea with vomiting, unspecified: Secondary | ICD-10-CM | POA: Insufficient documentation

## 2021-06-03 DIAGNOSIS — M8440XA Pathological fracture, unspecified site, initial encounter for fracture: Secondary | ICD-10-CM

## 2021-06-03 LAB — CBC WITH DIFFERENTIAL/PLATELET
Abs Immature Granulocytes: 0.05 10*3/uL (ref 0.00–0.07)
Basophils Absolute: 0 10*3/uL (ref 0.0–0.1)
Basophils Relative: 1 %
Eosinophils Absolute: 0.2 10*3/uL (ref 0.0–0.5)
Eosinophils Relative: 4 %
HCT: 36.8 % (ref 36.0–46.0)
Hemoglobin: 12.1 g/dL (ref 12.0–15.0)
Immature Granulocytes: 1 %
Lymphocytes Relative: 21 %
Lymphs Abs: 1.2 10*3/uL (ref 0.7–4.0)
MCH: 29.3 pg (ref 26.0–34.0)
MCHC: 32.9 g/dL (ref 30.0–36.0)
MCV: 89.1 fL (ref 80.0–100.0)
Monocytes Absolute: 0.6 10*3/uL (ref 0.1–1.0)
Monocytes Relative: 10 %
Neutro Abs: 3.5 10*3/uL (ref 1.7–7.7)
Neutrophils Relative %: 63 %
Platelets: 370 10*3/uL (ref 150–400)
RBC: 4.13 MIL/uL (ref 3.87–5.11)
RDW: 13.2 % (ref 11.5–15.5)
WBC: 5.5 10*3/uL (ref 4.0–10.5)
nRBC: 0 % (ref 0.0–0.2)

## 2021-06-03 LAB — COMPREHENSIVE METABOLIC PANEL
ALT: 31 U/L (ref 0–44)
AST: 31 U/L (ref 15–41)
Albumin: 3.5 g/dL (ref 3.5–5.0)
Alkaline Phosphatase: 77 U/L (ref 38–126)
Anion gap: 6 (ref 5–15)
BUN: 8 mg/dL (ref 6–20)
CO2: 25 mmol/L (ref 22–32)
Calcium: 8.8 mg/dL — ABNORMAL LOW (ref 8.9–10.3)
Chloride: 104 mmol/L (ref 98–111)
Creatinine, Ser: 0.59 mg/dL (ref 0.44–1.00)
GFR, Estimated: >60 mL/min (ref >60)
Glucose, Bld: 107 mg/dL — ABNORMAL HIGH (ref 70–99)
Potassium: 3.7 mmol/L (ref 3.5–5.1)
Sodium: 135 mmol/L (ref 135–145)
Total Bilirubin: 0.4 mg/dL (ref 0.3–1.2)
Total Protein: 7.2 g/dL (ref 6.5–8.1)

## 2021-06-03 MED ORDER — SODIUM CHLORIDE 0.9% FLUSH
10.0000 mL | INTRAVENOUS | Status: DC | PRN
  Filled 2021-06-03: qty 10

## 2021-06-03 MED ORDER — DENOSUMAB 120 MG/1.7ML ~~LOC~~ SOLN
120.0000 mg | Freq: Once | SUBCUTANEOUS | Status: AC
  Administered 2021-06-03: 120 mg via SUBCUTANEOUS
  Filled 2021-06-03: qty 1.7

## 2021-06-03 MED ORDER — SODIUM CHLORIDE 0.9 % IV SOLN
420.0000 mg | Freq: Once | INTRAVENOUS | Status: AC
  Administered 2021-06-03: 420 mg via INTRAVENOUS
  Filled 2021-06-03: qty 14

## 2021-06-03 MED ORDER — DIPHENHYDRAMINE HCL 50 MG/ML IJ SOLN
50.0000 mg | Freq: Once | INTRAMUSCULAR | Status: AC
  Administered 2021-06-03: 50 mg via INTRAVENOUS
  Filled 2021-06-03: qty 1

## 2021-06-03 MED ORDER — HEPARIN SOD (PORK) LOCK FLUSH 100 UNIT/ML IV SOLN
INTRAVENOUS | Status: AC
  Administered 2021-06-03: 500 [IU]
  Filled 2021-06-03: qty 5

## 2021-06-03 MED ORDER — ACETAMINOPHEN 325 MG PO TABS
650.0000 mg | ORAL_TABLET | Freq: Once | ORAL | Status: AC
  Administered 2021-06-03: 650 mg via ORAL
  Filled 2021-06-03: qty 2

## 2021-06-03 MED ORDER — BENZOYL PEROXIDE-ERYTHROMYCIN 5-3 % EX GEL
Freq: Two times a day (BID) | CUTANEOUS | 0 refills | Status: DC
  Filled 2021-06-03: qty 23.3, 30d supply, fill #0
  Filled 2021-06-06: qty 46.6, 30d supply, fill #0

## 2021-06-03 MED ORDER — HEPARIN SOD (PORK) LOCK FLUSH 100 UNIT/ML IV SOLN
500.0000 [IU] | Freq: Once | INTRAVENOUS | Status: AC | PRN
  Filled 2021-06-03: qty 5

## 2021-06-03 MED ORDER — SODIUM CHLORIDE 0.9 % IV SOLN
Freq: Once | INTRAVENOUS | Status: AC
  Filled 2021-06-03: qty 250

## 2021-06-03 MED ORDER — OLANZAPINE 10 MG PO TABS
10.0000 mg | ORAL_TABLET | Freq: Every evening | ORAL | 2 refills | Status: DC | PRN
  Filled 2021-06-03: qty 30, 30d supply, fill #0

## 2021-06-03 MED ORDER — TRASTUZUMAB-ANNS CHEMO 150 MG IV SOLR
400.0000 mg | Freq: Once | INTRAVENOUS | Status: AC
  Administered 2021-06-03: 400 mg via INTRAVENOUS
  Filled 2021-06-03: qty 19.05

## 2021-06-03 NOTE — Progress Notes (Signed)
Per MD will adjust Herceptin dose due to weight gain. ?

## 2021-06-03 NOTE — Progress Notes (Signed)
? ?  ?Palliative Medicine ?[place] at [facility] ?[phone] [phone] ? ? ?Name: Sophia [name] ?Date: 06/03/2021 ?MRN: [id number]  ?DOB: [birth year] ? ?Patient Care Team: ?Sindy Guadeloupe, MD as PCP - General (Oncology) ?Rico Junker, RN as Registered Nurse ?Theodore Demark, RN as Registered Nurse ?Sindy Guadeloupe, MD as Consulting Physician (Hematology and Oncology)  ? ? ?REASON FOR CONSULTATION: ?Sophia [name] is a 41 y.o. 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 MRI of the brain on 02/08/2021 revealed widespread metastatic disease to the brain with mild associated edema and small area of hemorrhage in the right frontal.  Patient is status post whole brain radiation.  She completed Taxol and is now on Herceptin and Perjeta maintenance. She was referred to palliative care to help address goals and manage ongoing symptoms ? ?SOCIAL HISTORY:    ? reports that she has never smoked. She has never used smokeless tobacco. She reports that she does not currently use alcohol. She reports that she does not currently use drugs. ? ?Patient is from [place].  She speaks [place].  She lives here with her 3 children ages 21-16.  She has no family or friend/social support locally.  She is unable to work. ? ?ADVANCE DIRECTIVES:  ?Does not have ? ?CODE STATUS:  ? ?PAST MEDICAL HISTORY: ?Past Medical History:  ?Diagnosis Date  ? Anxiety   ? Breast cancer ([place])   ? with mets  ? Cancer [facility])   ? Colitis   ? COVID-19 in immunocompromised patient [facility])   ? Family history of colon cancer   ? Vertigo   ? ? ?PAST SURGICAL HISTORY:  ?Past Surgical History:  ?Procedure Laterality Date  ? BREAST BIOPSY Left 08/14/2019  ? [place] bx of mass, coil marker, path pending  ? BREAST BIOPSY Left 08/14/2019  ? [place] bx of LN, hydromarker, path pending  ? BREAST BIOPSY Left 08/14/2019  ? affirm bx of calcs, x  marker, path pending  ? ESOPHAGOGASTRODUODENOSCOPY (EGD) WITH PROPOFOL N/A 10/05/2019  ? Procedure: ESOPHAGOGASTRODUODENOSCOPY (EGD) WITH PROPOFOL;  Surgeon: Lin Landsman, MD;  Location: [facility] ENDOSCOPY;  Service: Gastroenterology;  Laterality: N/A;  ? FLEXIBLE SIGMOIDOSCOPY N/A 10/05/2019  ? Procedure: FLEXIBLE SIGMOIDOSCOPY;  Surgeon: Lin Landsman, MD;  Location: [facility] ENDOSCOPY;  Service: Gastroenterology;  Laterality: N/A;  ? INTRAMEDULLARY (IM) NAIL INTERTROCHANTERIC Right 09/01/2019  ? Procedure: INTRAMEDULLARY (IM) NAIL INTERTROCHANTRIC AND RADIOFREQUENCY ABLATION;  Surgeon: Hessie Knows, MD;  Location: [facility] ORS;  Service: Orthopedics;  Laterality: Right;  ? KYPHOPLASTY N/A 08/29/2019  ? Procedure: KYPHOPLASTY T6, L1,L4 ,  RADIOFREQUENCY ABLATION;  Surgeon: Hessie Knows, MD;  Location: [facility] ORS;  Service: Orthopedics;  Laterality: N/A;  ? KYPHOPLASTY Right 09/01/2019  ? Procedure: Right Sacral Radiofrequency Ablation and Cement Augmentation, Right sacrum and iliac crest;  Surgeon: Hessie Knows, MD;  Location: [facility] ORS;  Service: Orthopedics;  Laterality: Right;  ? PORTA CATH INSERTION N/A 08/28/2019  ? Procedure: PORTA CATH INSERTION;  Surgeon: Algernon Huxley, MD;  Location: [facility] LAB;  Service: Cardiovascular;  Laterality: N/A;  ? ? 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) ?  ?01/21/2021 Cancer Staging  ? Staging form: Breast, AJCC 8th Edition ?- Clinical stage from 01/21/2021: Stage IV (cT2, cN1, cM1,  G2, ER-, PR-, HER2+) - Signed by Sindy Guadeloupe, MD on 01/21/2021 ?Histologic grading system: 3 grade system ?  ?Metastatic breast cancer ([place])  ?08/29/2019 Initial Diagnosis  ? Metastatic breast cancer [facility]) ?  ?09/04/2019 -  Chemotherapy  ? Patient is on Treatment Plan : BREAST Weekly Paclitaxel + Trastuzumab + Pertuzumab  q21d  ?   ? Genetic Testing  ? Negative genetic testing. No pathogenic variants identified on the [facility] CancerNext-Expanded+RNA panel. The report date is 04/13/2020.  ? ?The CancerNext-Expanded + RNAinsight gene panel offered by [employer] and includes sequencing and rearrangement analysis for the following 77 genes: IP, ALK, APC*, ATM*, AXIN2, BAP1, BARD1, BLM, BMPR1A, BRCA1*, BRCA2*, BRIP1*, CDC73, CDH1*,CDK4, CDKN1B, CDKN2A, CHEK2*, CTNNA1, DICER1, FANCC, FH, FLCN, GALNT12, KIF1B, LZTR1, MAX, MEN1, MET, MLH1*, MSH2*, MSH3, MSH6*, MUTYH*, NBN, NF1*, NF2, NTHL1, PALB2*, PHOX2B, PMS2*, POT1, PRKAR1A, PTCH1, PTEN*, RAD51C*, RAD51D*,RB1, RECQL, RET, SDHA, SDHAF2, SDHB, SDHC, SDHD, SMAD4, SMARCA4, SMARCB1, SMARCE1, STK11, SUFU, TMEM127, TP53*,TSC1, TSC2, VHL and XRCC2 (sequencing and deletion/duplication); EGFR, EGLN1, HOXB13, KIT, MITF, PDGFRA, POLD1 and POLE (sequencing only); EPCAM and GREM1 (deletion/duplication only). DNA and RNA analyses performed for * genes. ?  ? ? ?ALLERGIES:  has No Known Allergies. ? ?MEDICATIONS:  ?Current Outpatient Medications  ?Medication Sig Dispense Refill  ? albuterol (VENTOLIN HFA) 108 (90 Base) MCG/ACT inhaler Inhale 2 puffs into the lungs every 6 (six) hours as needed for wheezing or shortness of breath. 8.5 g 2  ? Calcium Carb-Cholecalciferol 600-10 MG-MCG TABS Take 1 tablet by mouth 2 (two) times daily. 180 tablet 0  ? diphenoxylate-atropine (LOMOTIL) 2.5-0.025 MG tablet Take 1 tablet by mouth 4 (four) times daily as needed for diarrhea or loose stools. (Patient not taking: Reported on 11/19/2020) 40 tablet 0  ? doxycycline (VIBRA-TABS) 100 MG tablet Take 1 tablet (100 mg total) by mouth 2 (two) times daily. 14 tablet 0  ? DULoxetine (CYMBALTA) 60 MG capsule Take 1 capsule (60 mg total) by mouth daily. 30 capsule 3  ? gabapentin (NEURONTIN) 600 MG tablet Take 1 tablet (600 mg total) by mouth 3 (three) times daily. 90 tablet 3  ? lidocaine-prilocaine (EMLA) cream Apply 1  application topically as needed. Apply small amount to port site at least 1 hour prior to it being accessed, cover with plastic wrap 30 g 3  ? LORazepam (ATIVAN) 0.5 MG tablet Take 1 tablet (0.5 mg total) by mouth every 6 (six) hours as needed for anxiety. AS NEEDED FOR NAUSEA 45 tablet 0  ? morphine (MS CONTIN) 15 MG 12 hr tablet Take 1 tablet (15 mg total) by mouth every 12 (twelve) hours. 30 tablet 0  ? nitrofurantoin, macrocrystal-monohydrate, (MACROBID) 100 MG capsule Take 1 capsule (100 mg total) by mouth 2 (two) times daily. 14 capsule 0  ? OLANZapine (ZYPREXA) 10 MG tablet Take 1 tablet (10 mg total) by mouth at bedtime as needed (nausea). 30 tablet 2  ? ondansetron (ZOFRAN) 8 MG tablet Take 1 tablet (8 mg total) by mouth every 8 (eight) hours as needed for nausea or vomiting. 45 tablet 2  ? oxyCODONE (OXY IR/ROXICODONE) 5 MG immediate release tablet Take 1-2 tablets (5-10 mg total) by mouth every 4 (four) hours as needed. 90 tablet 0  ? polyethylene glycol powder (MIRALAX) 17 GM/SCOOP powder Take 17 g by mouth daily as needed. 238 g 3  ? ?No current facility-administered medications for this visit.  ? ?Facility-Administered Medications Ordered in Other Visits  ?Medication Dose Route Frequency Provider Last Rate  Last Admin  ? 0.9 %  sodium chloride infusion   Intravenous Continuous Jacquelin Hawking, NP   Stopped at 06/02/20 1453  ? heparin lock flush 100 unit/mL  500 Units Intravenous Once Sindy Guadeloupe, MD      ? heparin lock flush 100 unit/mL  500 Units Intravenous Once Sindy Guadeloupe, MD      ? heparin lock flush 100 unit/mL  500 Units Intracatheter Once PRN Cammie Sickle, MD      ? pertuzumab (PERJETA) 420 mg in sodium chloride 0.9 % 250 mL chemo infusion  420 mg Intravenous Once Charlaine Dalton R, MD      ? sodium chloride flush (NS) 0.9 % injection 10 mL  10 mL Intravenous Once Sindy Guadeloupe, MD      ? sodium chloride flush (NS) 0.9 % injection 10 mL  10 mL Intravenous Once Sindy Guadeloupe, MD      ? sodium chloride flush (NS) 0.9 % injection 10 mL  10 mL Intracatheter PRN Cammie Sickle, MD      ? trastuzumab-anns Calla Kicks) 400 mg in sodium chloride 0.9 % 250 mL chemo infusion  400 mg Int

## 2021-06-03 NOTE — Patient Instructions (Signed)
[facility] CANCER CTR AT [place]  Discharge Instructions: ?Thank you for choosing [facility] to provide your oncology and hematology care.  ?If you have a lab appointment with the [facility], please go directly to the [facility] and check in at the registration area. ? ?Wear comfortable clothing and clothing appropriate for easy access to any Portacath or PICC line.  ? ?We strive to give you quality time with your provider. You may need to reschedule your appointment if you arrive late (15 or more minutes).  Arriving late affects you and other patients whose appointments are after yours.  Also, if you miss three or more appointments without notifying the office, you may be dismissed from the clinic at the provider?s discretion.    ?  ?For prescription refill requests, have your pharmacy contact our office and allow 72 hours for refills to be completed.   ? ?Today you received the following chemotherapy and/or immunotherapy agents - trastuzumab, pertuzumab    ?  ?To help prevent nausea and vomiting after your treatment, we encourage you to take your nausea medication as directed. ? ?BELOW ARE SYMPTOMS THAT SHOULD BE REPORTED IMMEDIATELY: ?*FEVER GREATER THAN 100.4 F (38 ?C) OR HIGHER ?*CHILLS OR SWEATING ?*NAUSEA AND VOMITING THAT IS NOT CONTROLLED WITH YOUR NAUSEA MEDICATION ?*UNUSUAL SHORTNESS OF BREATH ?*UNUSUAL BRUISING OR BLEEDING ?*URINARY PROBLEMS (pain or burning when urinating, or frequent urination) ?*BOWEL PROBLEMS (unusual diarrhea, constipation, pain near the anus) ?TENDERNESS IN MOUTH AND THROAT WITH OR WITHOUT PRESENCE OF ULCERS (sore throat, sores in mouth, or a toothache) ?UNUSUAL RASH, SWELLING OR PAIN  ?UNUSUAL VAGINAL DISCHARGE OR ITCHING  ? ?Items with * indicate a potential emergency and should be followed up as soon as possible or go to the Emergency Department if any problems should occur. ? ?Please show the CHEMOTHERAPY ALERT CARD or IMMUNOTHERAPY ALERT CARD  at check-in to the Emergency Department and triage nurse. ? ?Should you have questions after your visit or need to cancel or reschedule your appointment, please contact [facility] CANCER [place] AT [place]  [phone] and follow the prompts.  Office hours are 8:00 a.m. to 4:30 p.m. Monday - Friday. Please note that voicemails left after 4:00 p.m. may not be returned until the following business day.  We are closed weekends and major holidays. You have access to a nurse at all times for urgent questions. Please call the main number to the clinic [phone] and follow the prompts. ? ?For any non-urgent questions, you may also contact your provider using MyChart. We now offer e-Visits for anyone 54 and older to request care online for non-urgent symptoms. For details visit [email or web address]. ?  ?Also download the MyChart app! Go to the app store, search "MyChart", open the app, select [facility], and log in with your MyChart username and password. ? ?Due to Covid, a mask is required upon entering the hospital/clinic. If you do not have a mask, one will be given to you upon arrival. For doctor visits, patients may have 1 support person aged 6 or older with them. For treatment visits, patients cannot have anyone with them due to current Covid guidelines and our immunocompromised population.  ? ?Trastuzumab injection for infusion ??Qu? es [employer]? ?El TRASTUZUMAB es un anticuerpo monoclonal. Se utiliza para tratar el c?ncer de mama y de est?mago. ?Este medicamento puede ser utilizado para otros usos; si tiene alguna pregunta consulte con su proveedor de atenci?n m?dica o con su farmac?utico. ?MARCAS COMUNES: Herceptin, Belenda Cruise,  Brooke Bonito, Trazimera ??Qu? le debo informar a mi profesional de la salud antes de tomar este medicamento? ?Necesitan saber si usted presenta alguno de los siguientes problemas o situaciones: ?enfermedad cardiaca ?insuficiencia cardiaca ?enfermedad  pulmonar o respiratoria, como asma ?una reacci?n al?rgica o inusual al trastuzumab, al alcohol benc?lico, a otros medicamentos, alimentos, colorantes o conservantes ?si est? embarazada o buscando quedar embarazada ?si est? amamantando a un beb? ??C?mo debo utilizar este medicamento? ?Este medicamento se administra mediante infusi?n por v?a intravenosa. Lo administra un profesional de la salud calificado en un hospital o en un entorno cl?nico. ?Hable con su pediatra para informarse acerca del uso de este medicamento en ni?os. Este medicamento no est? aprobado para uso en ni?os. ?Sobredosis: P?ngase en contacto inmediatamente con un centro toxicol?gico o una sala de urgencia si usted cree que haya tomado demasiado medicamento. ?ATENCI?N: [employer] es solo para usted. No comparta este medicamento con nadie. ??Qu? sucede si me olvido de una dosis? ?Es importante no olvidar ninguna dosis. Informe a su m?dico o a su profesional de la salud si no puede asistir a una cita. ??Qu? puede interactuar con este medicamento? ?Este medicamento podr?a interactuar con los siguientes f?rmacos: ?ciertos tipos de quimioterapia, tales como daunorubicina, doxorrubicina, epirubicina, e idarubicina ?Puede ser que esta lista no menciona todas las posibles interacciones. Informe a su profesional de la salud de [employer] productos a base de hierbas, medicamentos de venta libre o suplementos nutritivos que est? tomando. Si usted fuma, consume bebidas alcoh?licas o si utiliza drogas ilegales, ind?queselo tambi?n a su profesional de [employer]	[place]. Algunas sustancias pueden interactuar con su medicamento. ??A qu? debo estar atento al [employer]? ?Visite a su m?dico para chequear su evoluci?n peri?dicamente. Si presenta alg?n efecto secundario, inf?rmelo. Sin embargo, contin?e con el tratamiento aun si se siente enfermo, a menos que su m?dico le indique que lo suspenda. ?Consulte a su m?dico o a su profesional de la salud si tiene  fiebre, escalofr?os, dolor de garganta, o cualquier otro s?ntoma de resfr?o o gripe. No se trate usted mismo. Trate de no acercarse a personas que est?n enfermas. ?Es posible que tenga fiebre, escalofr?os y temblores durante su primera infusi?n. Estos efectos generalmente son leves y pueden tratarse con otros medicamentos. Informe cualquier efecto secundario durante la infusi?n a su profesional de [employer]	[place]. La fiebre y los escalofr?os por lo general no suceden con las infusiones posteriores. ?No debe quedar embarazada mientras est? tomando este medicamento o por 7 meses despu?s de dejar de usarlo. Las mujeres deben informar a su m?dico si est?n buscando quedar embarazadas o si creen que est?n embarazadas. Las mujeres con la posibilidad de Best [employer] ni?os deben tener una prueba de embarazo negativa antes de empezar a tomar [employer]. Existe la posibilidad de que ocurran efectos secundarios graves a un beb? sin nacer. Para m?s informaci?n hable con su profesional de la salud o su farmac?utico. No debe amamantar a un beb? mientras est? tomando este medicamento o por 7 meses despu?s de dejar de usarlo. ?Las [employer] deben usar un m?todo anticonceptivo eficaz con este medicamento. ??Qu? efectos secundarios puedo tener al [employer] medicamento? ?Efectos secundarios que debe informar a su m?dico o a su profesional de la salud tan pronto como sea posible: ?reacciones al?rgicas, como erupci?n cut?nea, comez?n/picaz?n o urticaria, e hinchaz?n de la cara, los labios o la lengua ?dolor en el pecho o palpitaciones ?tos ?mareos ?sensaci?n de desmayos o aturdimiento, ca?das ?fiebre ?sensaci?n general de estar enfermo o s?ntomas gripales ?  signos de empeoramiento de la insuficiencia cardiaca, tales como problemas respiratorios; hinchaz?n en las piernas y los pies ?cansancio o debilidad inusual ?Efectos secundarios que generalmente no requieren atenci?n m?dica (inf?rmelos a su m?dico o a su profesional de la salud si persisten  o si son molestos): ?dolor de huesos ?cambios en el sentido del gusto ?diarrea ?dolor en las articulaciones ?n?useas, v?mito ?p?rdida de peso ?Puede ser que esta lista no menciona todos los posibles efectos s

## 2021-06-03 NOTE — Addendum Note (Signed)
Addended by: Verlon Au on: 06/03/2021 11:06 AM ? ? Modules accepted: Orders ? ?

## 2021-06-03 NOTE — Progress Notes (Signed)
? ?Hematology/Oncology Consult Note ?[place]  ?Telephone:(336) [phone] [phone] ? ?Patient Care Team: ?Sindy Guadeloupe, MD as PCP - General (Oncology) ?Rico Junker, RN as Registered Nurse ?Theodore Demark, RN as Registered Nurse ?Sindy Guadeloupe, MD as Consulting Physician (Hematology and Oncology)  ? ?Name of the patient: Sophia [name]  ?[id number]  ?1980/05/30  ? ?Date of visit: 06/03/21 ? ?Diagnosis- stage IV metastatic breast cancer ER/PR negative HER-2/neu positive with bone metastases ?  ? ?Chief complaint/ Reason for visit-on treatment assessment prior to next cycle of Herceptin and Perjeta ? ?Heme/Onc history:  patient is a 41 year old Hispanic female who is here with her friend.  History obtained with the help of an interpreter.Patient self palpated left breast mass which was followed by a diagnostic bilateral mammogram.  Mammogram showed 3.1 x 2.9 x 1.9 cm hypoechoic mass at the 1 o'clock position of the left breast.  For abnormal cortically thickened left axillary lymph nodes measuring up to 5 mm.  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  Patient was also having ongoing back pain and was seen by [facility] orthopedics Dr. Doyle Askew who ordered MRI lumbar spine without contrast which showed possible pathologic fractures of L1 and L4 vertebral bodies with greater than 50% height loss at L1 and abnormal signal involving L2-L3 S1 as well as right iliac bone concerning for metastatic disease.  Patient is a single mother of 3 adult children and is very anxious today.  She reports significant back pain which radiates to her bilateral thighs.  Denies any focal tingling numbness or weakness.  Denies any bowel bladder incontinence.  Pain has been uncontrolled despite taking Tylenol.  No prior history of abnormal breast biopsies.  No family history of breast  cancer ?  ?PET and MRI showed 3 areas of pathologic fracture of her spine as well as widespread bony metastatic disease and concern for impending fracture of the right hip.  Given her worsening pain she was asked to come to the ER.  She has been evaluated by Dr. Rudene Christians from orthopedic surgery and underwent kyphoplasty at 3 different levels. T6 L1 and L4 along with radiofrequency ablation.    She also underwent prophylactic fixation of the right hip and not affected the sacral region. ?  ?  ?Patient received first dose of Herceptin and Perjeta on 09/04/2019.  Patient initially received Taxol and Herceptin perjeta.  Presently patient is on maintenance Herceptin and Perjeta alone.   ?  ?  ?Interval history-patient is 41 year old female with above history of breast cancer currently undergoing palliative Herceptin and Perjeta.  She returns to clinic for follow-up, discussion of imaging results, and consideration of treatment.  Says that her energy is better.  She has been followed by palliative care and some nausea medication that she was recently prescribed controlled her symptoms well.  Requesting refill.  Pain is well controlled.  Requests refill of 12-hour pain medication.  Denies specific complaints today. ? ?ECOG PS- 1 ?Pain scale- 4 ?Opioid associated constipation- no ? ?Review of systems- Review of Systems  ?Constitutional:  Positive for malaise/fatigue. Negative for chills, fever and weight loss.  ?HENT:  Negative for congestion, ear discharge and nosebleeds.   ?Eyes:  Negative for blurred vision.  ?Respiratory:  Negative for cough, hemoptysis, sputum production, shortness of breath and wheezing.   ?Cardiovascular:  Negative for chest  pain, palpitations, orthopnea and claudication.  ?Gastrointestinal:  Negative for abdominal pain, blood in stool, constipation, diarrhea, heartburn, melena, nausea and vomiting.  ?Genitourinary:  Negative for dysuria, flank pain, frequency, hematuria and urgency.  ?Musculoskeletal:   Negative for back pain, joint pain and myalgias.  ?     Right hip pain  ?Skin:  Negative for rash.  ?Neurological:  Negative for dizziness, tingling, focal weakness, seizures, weakness and headaches.  ?Endo/Heme/Allergies:  Does not bruise/bleed easily.  ?Psychiatric/Behavioral:  Negative for depression and suicidal ideas. The patient does not have insomnia.    ? ?No Known Allergies ? ?Past Medical History:  ?Diagnosis Date  ? Anxiety   ? Breast cancer ([place])   ? with mets  ? Cancer [facility])   ? Colitis   ? COVID-19 in immunocompromised patient [facility])   ? Family history of colon cancer   ? Vertigo   ? ?Past Surgical History:  ?Procedure Laterality Date  ? BREAST BIOPSY Left 08/14/2019  ? [place] bx of mass, coil marker, path pending  ? BREAST BIOPSY Left 08/14/2019  ? [place] bx of LN, hydromarker, path pending  ? BREAST BIOPSY Left 08/14/2019  ? affirm bx of calcs, x marker, path pending  ? ESOPHAGOGASTRODUODENOSCOPY (EGD) WITH PROPOFOL N/A 10/05/2019  ? Procedure: ESOPHAGOGASTRODUODENOSCOPY (EGD) WITH PROPOFOL;  Surgeon: Lin Landsman, MD;  Location: [facility] ENDOSCOPY;  Service: Gastroenterology;  Laterality: N/A;  ? FLEXIBLE SIGMOIDOSCOPY N/A 10/05/2019  ? Procedure: FLEXIBLE SIGMOIDOSCOPY;  Surgeon: Lin Landsman, MD;  Location: [facility] ENDOSCOPY;  Service: Gastroenterology;  Laterality: N/A;  ? INTRAMEDULLARY (IM) NAIL INTERTROCHANTERIC Right 09/01/2019  ? Procedure: INTRAMEDULLARY (IM) NAIL INTERTROCHANTRIC AND RADIOFREQUENCY ABLATION;  Surgeon: Hessie Knows, MD;  Location: [facility] ORS;  Service: Orthopedics;  Laterality: Right;  ? KYPHOPLASTY N/A 08/29/2019  ? Procedure: KYPHOPLASTY T6, L1,L4 ,  RADIOFREQUENCY ABLATION;  Surgeon: Hessie Knows, MD;  Location: [facility] ORS;  Service: Orthopedics;  Laterality: N/A;  ? KYPHOPLASTY Right 09/01/2019  ? Procedure: Right Sacral Radiofrequency Ablation and Cement Augmentation, Right sacrum and iliac crest;  Surgeon: Hessie Knows, MD;  Location: [facility] ORS;  Service: Orthopedics;   Laterality: Right;  ? PORTA CATH INSERTION N/A 08/28/2019  ? Procedure: PORTA CATH INSERTION;  Surgeon: Algernon Huxley, MD;  Location: [place] CV LAB;  Service: Cardiovascular;  Laterality: N/A;  ? ? ?Social History  ? ?Socioeconomic History  ? Marital status: Single  ?  Spouse name: Not on file  ? Number of children: Not on file  ? Years of education: Not on file  ? Highest education level: Not on file  ?Occupational History  ? Not on file  ?Tobacco Use  ? Smoking status: Never  ? Smokeless tobacco: Never  ?Vaping Use  ? Vaping Use: Never used  ?Substance and Sexual Activity  ? Alcohol use: Not Currently  ? Drug use: Not Currently  ? Sexual activity: Not Currently  ?  Birth control/protection: None  ?Other Topics Concern  ? Not on file  ?Social History Narrative  ? Lives at home with children  ? ?Social Determinants of Health  ? ?Financial Resource Strain: Not on file  ?Food Insecurity: Not on file  ?Transportation Needs: Not on file  ?Physical Activity: Not on file  ?Stress: Not on file  ?Social Connections: Not on file  ?Intimate Partner Violence: Not on file  ? ? ?Family History  ?Problem Relation Age of Onset  ? Colon cancer Maternal Uncle   ? ? ?Current Outpatient Medications:  ?  albuterol (  VENTOLIN HFA) 108 (90 Base) MCG/ACT inhaler, Inhale 2 puffs into the lungs every 6 (six) hours as needed for wheezing or shortness of breath., Disp: 8.5 g, Rfl: 2 ?  Calcium Carb-Cholecalciferol 600-10 MG-MCG TABS, Take 1 tablet by mouth 2 (two) times daily., Disp: 180 tablet, Rfl: 0 ?  doxycycline (VIBRA-TABS) 100 MG tablet, Take 1 tablet (100 mg total) by mouth 2 (two) times daily., Disp: 14 tablet, Rfl: 0 ?  DULoxetine (CYMBALTA) 60 MG capsule, Take 1 capsule (60 mg total) by mouth daily., Disp: 30 capsule, Rfl: 3 ?  gabapentin (NEURONTIN) 600 MG tablet, Take 1 tablet (600 mg total) by mouth 3 (three) times daily., Disp: 90 tablet, Rfl: 3 ?  lidocaine-prilocaine (EMLA) cream, Apply 1 application topically as needed.  Apply small amount to port site at least 1 hour prior to it being accessed, cover with plastic wrap, Disp: 30 g, Rfl: 3 ?  LORazepam (ATIVAN) 0.5 MG tablet, Take 1 tablet (0.5 mg total) by mouth every 6 (six) hou

## 2021-06-03 NOTE — Progress Notes (Signed)
Nutrition Follow-up: ? ?Patient identified on Malnutrition Screening report for weight loss ? ?41 year old female with metastatic breast cancer with bone metastases.  Patient s/p whole brain radiation.  Currently receiving herceptin and perjeta.  Receives xgeva for bone metastases as well.   ? ?Met with patient during infusion.  She was eating lunch tray of [place] sandwich, chips and cookie.  Reports that she is eating well.  Vomiting is less with new medication.  She usually eats whole milk and cookie for breakfast. Lunch is sometimes chicken with rice or beans and rice. Supper is fruit and cereal.  Denies diarrhea.   ? ? ? ?Medications: zyprexa, zofran ? ?Labs: glucose 107 ? ?Anthropometrics:  ? ?Weight 147 lb 9.6 oz today ?138 lb 10.7 on 2/24 ?145 lb on 1/13 ?136 lb on 12/2 ?137 lb on 11/4 ? ? ?NUTRITION DIAGNOSIS: Inadequate oral intake related to cancer related treatment side effect (nausea/vomiting) as evidenced by weight loss but weight has improved ? ? ? ?INTERVENTION:  ?Encouraged patient to continue taking nausea medication to control symptom. ?Encouraged patient to continue to eat good sources of protein.  We talked about examples of those.   ?Nutrition for People During Cancer Treatment booklet given in spanish to patient.   ?Contact information given ?  ? ?MONITORING, EVALUATION, GOAL: weight trends, intake ? ? ?NEXT VISIT: no follow-up ?RD available as needed ? ?Ambrea Hegler B. Zenia Resides, RD, LDN ?Registered Dietitian ?[phone] (mobile) ? ? ?

## 2021-06-06 ENCOUNTER — Other Ambulatory Visit: Payer: Self-pay

## 2021-06-06 ENCOUNTER — Encounter: Payer: Self-pay | Admitting: Oncology

## 2021-06-10 ENCOUNTER — Encounter: Payer: Self-pay | Admitting: Oncology

## 2021-06-10 ENCOUNTER — Other Ambulatory Visit: Payer: Self-pay

## 2021-06-10 ENCOUNTER — Other Ambulatory Visit: Payer: Self-pay | Admitting: *Deleted

## 2021-06-10 MED ORDER — MORPHINE SULFATE ER 15 MG PO TBCR
15.0000 mg | EXTENDED_RELEASE_TABLET | Freq: Two times a day (BID) | ORAL | 0 refills | Status: DC
  Filled 2021-06-10: qty 30, 15d supply, fill #0

## 2021-06-11 ENCOUNTER — Other Ambulatory Visit: Payer: Self-pay | Admitting: *Deleted

## 2021-06-11 MED ORDER — OXYCODONE HCL 5 MG PO TABS: 5.0000 mg | ORAL_TABLET | ORAL | 0 refills | Status: DC | PRN

## 2021-06-13 ENCOUNTER — Encounter: Payer: Self-pay | Admitting: Oncology

## 2021-06-13 ENCOUNTER — Other Ambulatory Visit: Payer: Self-pay

## 2021-06-13 ENCOUNTER — Telehealth: Payer: Self-pay | Admitting: *Deleted

## 2021-06-13 NOTE — Telephone Encounter (Signed)
Pt texted me about refill of oxycodone- the short acting meds. She did not check for that med to see when it was needing to get refilled and she went on sat am and took 2 tablets and that was the last of them. She wanted to know [facility] not open on weekends. I called Dr. Janese Banks on call and she agreed [facility]. Pt went and got the medication ?

## 2021-06-15 ENCOUNTER — Encounter: Payer: Self-pay | Admitting: Licensed Clinical Social Worker

## 2021-06-15 ENCOUNTER — Other Ambulatory Visit: Payer: Self-pay

## 2021-06-15 NOTE — Progress Notes (Signed)
[facility] CSW Progress Note ? ?Clinical Social Worker contacted patient by phone to discuss attendance for Spanish Speaking Support Group.  Patient stated she was on another call and stated she would contact CSW later on today. ? ? ?Sherlie Boyum , LCSW ?

## 2021-06-17 ENCOUNTER — Other Ambulatory Visit: Payer: Self-pay

## 2021-06-19 ENCOUNTER — Encounter: Payer: Self-pay | Admitting: Oncology

## 2021-06-24 ENCOUNTER — Encounter: Payer: Self-pay | Admitting: Oncology

## 2021-06-24 ENCOUNTER — Other Ambulatory Visit: Payer: Self-pay

## 2021-06-24 ENCOUNTER — Inpatient Hospital Stay: Payer: Self-pay

## 2021-06-24 ENCOUNTER — Inpatient Hospital Stay: Payer: Self-pay | Attending: Oncology

## 2021-06-24 ENCOUNTER — Inpatient Hospital Stay (HOSPITAL_BASED_OUTPATIENT_CLINIC_OR_DEPARTMENT_OTHER): Payer: Self-pay | Admitting: Oncology

## 2021-06-24 VITALS — BP 111/77 | HR 86 | Temp 96.4°F | Resp 16 | Wt 147.6 lb

## 2021-06-24 DIAGNOSIS — C7951 Secondary malignant neoplasm of bone: Secondary | ICD-10-CM | POA: Insufficient documentation

## 2021-06-24 DIAGNOSIS — Z800 Family history of malignant neoplasm of digestive organs: Secondary | ICD-10-CM | POA: Insufficient documentation

## 2021-06-24 DIAGNOSIS — Z171 Estrogen receptor negative status [ER-]: Secondary | ICD-10-CM | POA: Insufficient documentation

## 2021-06-24 DIAGNOSIS — Z5112 Encounter for antineoplastic immunotherapy: Secondary | ICD-10-CM | POA: Insufficient documentation

## 2021-06-24 DIAGNOSIS — Z5111 Encounter for antineoplastic chemotherapy: Secondary | ICD-10-CM | POA: Insufficient documentation

## 2021-06-24 DIAGNOSIS — C7931 Secondary malignant neoplasm of brain: Secondary | ICD-10-CM | POA: Insufficient documentation

## 2021-06-24 DIAGNOSIS — C50912 Malignant neoplasm of unspecified site of left female breast: Secondary | ICD-10-CM

## 2021-06-24 DIAGNOSIS — T451X5A Adverse effect of antineoplastic and immunosuppressive drugs, initial encounter: Secondary | ICD-10-CM

## 2021-06-24 DIAGNOSIS — M8440XA Pathological fracture, unspecified site, initial encounter for fracture: Secondary | ICD-10-CM

## 2021-06-24 DIAGNOSIS — G620 Drug-induced polyneuropathy: Secondary | ICD-10-CM | POA: Insufficient documentation

## 2021-06-24 DIAGNOSIS — C50919 Malignant neoplasm of unspecified site of unspecified female breast: Secondary | ICD-10-CM

## 2021-06-24 DIAGNOSIS — G893 Neoplasm related pain (acute) (chronic): Secondary | ICD-10-CM | POA: Insufficient documentation

## 2021-06-24 DIAGNOSIS — C50412 Malignant neoplasm of upper-outer quadrant of left female breast: Secondary | ICD-10-CM | POA: Insufficient documentation

## 2021-06-24 LAB — COMPREHENSIVE METABOLIC PANEL
ALT: 21 U/L (ref 0–44)
AST: 35 U/L (ref 15–41)
Albumin: 3.6 g/dL (ref 3.5–5.0)
Alkaline Phosphatase: 74 U/L (ref 38–126)
Anion gap: 7 (ref 5–15)
BUN: 9 mg/dL (ref 6–20)
CO2: 24 mmol/L (ref 22–32)
Calcium: 8.6 mg/dL — ABNORMAL LOW (ref 8.9–10.3)
Chloride: 104 mmol/L (ref 98–111)
Creatinine, Ser: 0.52 mg/dL (ref 0.44–1.00)
GFR, Estimated: >60 mL/min (ref >60)
Glucose, Bld: 176 mg/dL — ABNORMAL HIGH (ref 70–99)
Potassium: 3.5 mmol/L (ref 3.5–5.1)
Sodium: 135 mmol/L (ref 135–145)
Total Bilirubin: 0.7 mg/dL (ref 0.3–1.2)
Total Protein: 7.3 g/dL (ref 6.5–8.1)

## 2021-06-24 LAB — CBC WITH DIFFERENTIAL/PLATELET
Abs Immature Granulocytes: 0.02 10*3/uL (ref 0.00–0.07)
Basophils Absolute: 0 10*3/uL (ref 0.0–0.1)
Basophils Relative: 0 %
Eosinophils Absolute: 0.1 10*3/uL (ref 0.0–0.5)
Eosinophils Relative: 2 %
HCT: 39 % (ref 36.0–46.0)
Hemoglobin: 12.7 g/dL (ref 12.0–15.0)
Immature Granulocytes: 0 %
Lymphocytes Relative: 18 %
Lymphs Abs: 1 10*3/uL (ref 0.7–4.0)
MCH: 28.7 pg (ref 26.0–34.0)
MCHC: 32.6 g/dL (ref 30.0–36.0)
MCV: 88 fL (ref 80.0–100.0)
Monocytes Absolute: 0.2 10*3/uL (ref 0.1–1.0)
Monocytes Relative: 4 %
Neutro Abs: 4.1 10*3/uL (ref 1.7–7.7)
Neutrophils Relative %: 76 %
Platelets: 328 10*3/uL (ref 150–400)
RBC: 4.43 MIL/uL (ref 3.87–5.11)
RDW: 12.8 % (ref 11.5–15.5)
WBC: 5.5 10*3/uL (ref 4.0–10.5)
nRBC: 0 % (ref 0.0–0.2)

## 2021-06-24 MED ORDER — SODIUM CHLORIDE 0.9 % IV SOLN
420.0000 mg | Freq: Once | INTRAVENOUS | Status: AC
  Administered 2021-06-24: 420 mg via INTRAVENOUS
  Filled 2021-06-24: qty 14

## 2021-06-24 MED ORDER — TRASTUZUMAB-ANNS CHEMO 150 MG IV SOLR
400.0000 mg | Freq: Once | INTRAVENOUS | Status: AC
  Administered 2021-06-24: 400 mg via INTRAVENOUS
  Filled 2021-06-24: qty 19.05

## 2021-06-24 MED ORDER — DIPHENHYDRAMINE HCL 50 MG/ML IJ SOLN
50.0000 mg | Freq: Once | INTRAMUSCULAR | Status: AC
  Administered 2021-06-24: 50 mg via INTRAVENOUS
  Filled 2021-06-24: qty 1

## 2021-06-24 MED ORDER — HEPARIN SOD (PORK) LOCK FLUSH 100 UNIT/ML IV SOLN
INTRAVENOUS | Status: AC
  Filled 2021-06-24: qty 5

## 2021-06-24 MED ORDER — SODIUM CHLORIDE 0.9 % IV SOLN
Freq: Once | INTRAVENOUS | Status: AC
  Filled 2021-06-24: qty 250

## 2021-06-24 MED ORDER — LORAZEPAM 0.5 MG PO TABS
0.5000 mg | ORAL_TABLET | Freq: Four times a day (QID) | ORAL | 0 refills | Status: DC | PRN
  Filled 2021-06-24: qty 45, 12d supply, fill #0

## 2021-06-24 MED ORDER — ACETAMINOPHEN 325 MG PO TABS
650.0000 mg | ORAL_TABLET | Freq: Once | ORAL | Status: AC
  Administered 2021-06-24: 650 mg via ORAL
  Filled 2021-06-24: qty 2

## 2021-06-24 MED ORDER — HEPARIN SOD (PORK) LOCK FLUSH 100 UNIT/ML IV SOLN
500.0000 [IU] | Freq: Once | INTRAVENOUS | Status: AC | PRN
  Administered 2021-06-24: 500 [IU]
  Filled 2021-06-24: qty 5

## 2021-06-24 NOTE — Progress Notes (Signed)
? ? ? ?Hematology/Oncology Consult note ?[place]  ?Telephone:(336) [phone] [phone] ? ?Patient Care Team: ?Sindy Guadeloupe, MD as PCP - General (Oncology) ?Rico Junker, RN as Registered Nurse ?Theodore Demark, RN as Registered Nurse ?Sindy Guadeloupe, MD as Consulting Physician (Hematology and Oncology)  ? ?Name of the patient: Sophia [name]  ?[id number]  ?[birth year]  ? ?Date of visit: 06/24/21 ? ?Diagnosis- stage IV metastatic breast cancer ER/PR negative HER-2/neu positive with bone metastases ? ?Chief complaint/ Reason for visit-on treatment assessment prior to next cycle of Herceptin and Perjeta ? ?Heme/Onc history: patient is a 41 year old Hispanic female who is here with her friend.  History obtained with the help of an interpreter.Patient self palpated left breast mass which was followed by a diagnostic bilateral mammogram.  Mammogram showed 3.1 x 2.9 x 1.9 cm hypoechoic mass at the 1 o'clock position of the left breast.  For abnormal cortically thickened left axillary lymph nodes measuring up to 5 mm.  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  Patient was also having ongoing back pain and was seen by [facility] orthopedics Dr. Doyle Askew who ordered MRI lumbar spine without contrast which showed possible pathologic fractures of L1 and L4 vertebral bodies with greater than 50% height loss at L1 and abnormal signal involving L2-L3 S1 as well as right iliac bone concerning for metastatic disease.  Patient is a single mother of 3 adult children and is very anxious today.  She reports significant back pain which radiates to her bilateral thighs.  Denies any focal tingling numbness or weakness.  Denies any bowel bladder incontinence.  Pain has been uncontrolled despite taking Tylenol.  No prior history of abnormal breast biopsies.  No family history of breast  cancer ?  ?PET and MRI showed 3 areas of pathologic fracture of her spine as well as widespread bony metastatic disease and concern for impending fracture of the right hip.  Given her worsening pain she was asked to come to the ER.  She has been evaluated by Dr. Rudene Christians from orthopedic surgery and underwent kyphoplasty at 3 different levels. T6 L1 and L4 along with radiofrequency ablation.    She also underwent prophylactic fixation of the right hip and not affected the sacral region. ?  ?  ?Patient received first dose of Herceptin and Perjeta on 09/04/2019.  Patient initially received Taxol and Herceptin perjeta.  Presently patient is on maintenance Herceptin and Perjeta alone.  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  ? ?Interval history-patient reports that her right hip pain is currently well controlled with as needed oxycodone.  She is ambulating with a slight limp but is able to do so without the help of a cane or a walker. ? ?ECOG PS- 1 ?Pain scale- 3 ? ? ?Review of systems- Review of Systems  ?Constitutional:  Negative for chills, fever, malaise/fatigue and weight loss.  ?HENT:  Negative for congestion, ear discharge and nosebleeds.   ?Eyes:  Negative for blurred vision.  ?Respiratory:  Negative for cough, hemoptysis, sputum production, shortness of breath and wheezing.   ?Cardiovascular:  Negative for chest pain, palpitations, orthopnea and claudication.  ?Gastrointestinal:  Negative for abdominal pain, blood in stool, constipation, diarrhea, heartburn, melena, nausea and vomiting.  ?Genitourinary:  Negative for dysuria, flank pain, frequency, hematuria and urgency.  ?Musculoskeletal:  Negative for back pain, joint pain and myalgias.  ?     Right hip pain  ?Skin:  Negative for rash.  ?Neurological:  Negative for dizziness, tingling, focal weakness, seizures, weakness and headaches.   ?Endo/Heme/Allergies:  Does not bruise/bleed easily.  ?Psychiatric/Behavioral:  Negative for depression and suicidal ideas. The patient does not have insomnia.    ? ? ?No Known Allergies ? ? ?Past Medical History:  ?Diagnosis Date  ? Anxiety   ? Breast cancer ([place])   ? with mets  ? Cancer [facility])   ? Colitis   ? COVID-19 in immunocompromised patient [facility])   ? Family history of colon cancer   ? Vertigo   ? ? ? ?Past Surgical History:  ?Procedure Laterality Date  ? BREAST BIOPSY Left 08/14/2019  ? [place] bx of mass, coil marker, path pending  ? BREAST BIOPSY Left 08/14/2019  ? [place] bx of LN, hydromarker, path pending  ? BREAST BIOPSY Left 08/14/2019  ? affirm bx of calcs, x marker, path pending  ? ESOPHAGOGASTRODUODENOSCOPY (EGD) WITH PROPOFOL N/A 10/05/2019  ? Procedure: ESOPHAGOGASTRODUODENOSCOPY (EGD) WITH PROPOFOL;  Surgeon: Lin Landsman, MD;  Location[facility] ENDOSCOPY;  Service: Gastroenterology;  Laterality: N/A;  ? FLEXIBLE SIGMOIDOSCOPY N/A 10/05/2019  ? Procedure: FLEXIBLE SIGMOIDOSCOPY;  Surgeon: Lin Landsman, MD;  Location: [facility] ENDOSCOPY;  Service: Gastroenterology;  Laterality: N/A;  ? INTRAMEDULLARY (IM) NAIL INTERTROCHANTERIC Right 09/01/2019  ? Procedure: INTRAMEDULLARY (IM) NAIL INTERTROCHANTRIC AND RADIOFREQUENCY ABLATION;  Surgeon: Hessie Knows, MD;  Location: [facility] ORS;  Service: Orthopedics;  Laterality: Right;  ? KYPHOPLASTY N/A 08/29/2019  ? Procedure: KYPHOPLASTY T6, L1,L4 ,  RADIOFREQUENCY ABLATION;  Surgeon: Hessie Knows, MD;  Location: [facility] ORS;  Service: Orthopedics;  Laterality: N/A;  ? KYPHOPLASTY Right 09/01/2019  ? Procedure: Right Sacral Radiofrequency Ablation and Cement Augmentation, Right sacrum and iliac crest;  Surgeon: Hessie Knows, MD;  Location: [facility] ORS;  Service: Orthopedics;  Laterality: Right;  ? PORTA CATH INSERTION N/A 08/28/2019  ? Procedure: PORTA CATH INSERTION;  Surgeon: Algernon Huxley, MD;  Location: [place] CV LAB;  Service: Cardiovascular;  Laterality: N/A;   ? ? ?Social History  ? ?Socioeconomic History  ? Marital status: Single  ?  Spouse name: Not on file  ? Number of children: Not on file  ? Years of education: Not on file  ? Highest education level: Not on file  ?Occupational History  ? Not on file  ?Tobacco Use  ? Smoking status: Never  ? Smokeless tobacco: Never  ?Vaping Use  ? Vaping Use: Never used  ?Substance and Sexual Activity  ? Alcohol use: Not Currently  ? Drug use: Not Currently  ? Sexual activity: Not Currently  ?  Birth control/protection: None  ?Other Topics Concern  ? Not on file  ?Social History Narrative  ? Lives at home with children  ? ?Social Determinants of Health  ? ?Financial Resource Strain: Not on file  ?Food Insecurity: Not on file  ?Transportation Needs: Not on file  ?Physical Activity: Not on file  ?Stress: Not on file  ?Social Connections: Not on file  ?Intimate Partner Violence: Not on file  ? ? ?Family History  ?Problem Relation Age of Onset  ? Colon cancer Maternal Uncle   ? ? ? ?Current Outpatient Medications:  ?  albuterol (VENTOLIN HFA) 108 (90 Base) MCG/ACT inhaler, Inhale 2 puffs into the lungs every 6 (six) hours as needed for  wheezing or shortness of breath., Disp: 8.5 g, Rfl: 2 ?  Calcium Carb-Cholecalciferol 600-10 MG-MCG TABS, Take 1 tablet by mouth 2 (two) times daily., Disp: 180 tablet, Rfl: 0 ?  DULoxetine (CYMBALTA) 60 MG capsule, Take 1 capsule (60 mg total) by mouth daily., Disp: 30 capsule, Rfl: 3 ?  gabapentin (NEURONTIN) 600 MG tablet, Take 1 tablet (600 mg total) by mouth 3 (three) times daily., Disp: 90 tablet, Rfl: 3 ?  lidocaine-prilocaine (EMLA) cream, Apply 1 application topically as needed. Apply small amount to port site at least 1 hour prior to it being accessed, cover with plastic wrap, Disp: 30 g, Rfl: 3 ?  morphine (MS CONTIN) 15 MG 12 hr tablet, Take 1 tablet (15 mg total) by mouth every 12 (twelve) hours., Disp: 30 tablet, Rfl: 0 ?  nitrofurantoin, macrocrystal-monohydrate, (MACROBID) 100 MG capsule,  Take 1 capsule (100 mg total) by mouth 2 (two) times daily., Disp: 14 capsule, Rfl: 0 ?  oxyCODONE (OXY IR/ROXICODONE) 5 MG immediate release tablet, Take 1-2 tablets (5-10 mg total) by mouth every 4 (four) hours as nee

## 2021-06-24 NOTE — Patient Instructions (Signed)
[facility] CANCER CTR AT [place]  Discharge Instructions: ?Thank you for choosing [facility] to provide your oncology and hematology care.  ?If you have a lab appointment with the [facility], please go directly to the [facility] and check in at the registration area. ? ?Wear comfortable clothing and clothing appropriate for easy access to any Portacath or PICC line.  ? ?We strive to give you quality time with your provider. You may need to reschedule your appointment if you arrive late (15 or more minutes).  Arriving late affects you and other patients whose appointments are after yours.  Also, if you miss three or more appointments without notifying the office, you may be dismissed from the clinic at the provider?s discretion.    ?  ?For prescription refill requests, have your pharmacy contact our office and allow 72 hours for refills to be completed.   ? ?Today you received the following chemotherapy and/or immunotherapy agents : Herceptin / Perjeta   ?  ?To help prevent nausea and vomiting after your treatment, we encourage you to take your nausea medication as directed. ? ?BELOW ARE SYMPTOMS THAT SHOULD BE REPORTED IMMEDIATELY: ?*FEVER GREATER THAN 100.4 F (38 ?C) OR HIGHER ?*CHILLS OR SWEATING ?*NAUSEA AND VOMITING THAT IS NOT CONTROLLED WITH YOUR NAUSEA MEDICATION ?*UNUSUAL SHORTNESS OF BREATH ?*UNUSUAL BRUISING OR BLEEDING ?*URINARY PROBLEMS (pain or burning when urinating, or frequent urination) ?*BOWEL PROBLEMS (unusual diarrhea, constipation, pain near the anus) ?TENDERNESS IN MOUTH AND THROAT WITH OR WITHOUT PRESENCE OF ULCERS (sore throat, sores in mouth, or a toothache) ?UNUSUAL RASH, SWELLING OR PAIN  ?UNUSUAL VAGINAL DISCHARGE OR ITCHING  ? ?Items with * indicate a potential emergency and should be followed up as soon as possible or go to the Emergency Department if any problems should occur. ? ?Please show the CHEMOTHERAPY ALERT CARD or IMMUNOTHERAPY ALERT CARD at  check-in to the Emergency Department and triage nurse. ? ?Should you have questions after your visit or need to cancel or reschedule your appointment, please contact [facility] CANCER [place] AT [place]  [phone] and follow the prompts.  Office hours are 8:00 a.m. to 4:30 p.m. Monday - Friday. Please note that voicemails left after 4:00 p.m. may not be returned until the following business day.  We are closed weekends and major holidays. You have access to a nurse at all times for urgent questions. Please call the main number to the clinic [phone] and follow the prompts. ? ?For any non-urgent questions, you may also contact your provider using MyChart. We now offer e-Visits for anyone 25 and older to request care online for non-urgent symptoms. For details visit [email or web address]. ?  ?Also download the MyChart app! Go to the app store, search "MyChart", open the app, select [place], and log in with your MyChart username and password. ? ?Due to Covid, a mask is required upon entering the hospital/clinic. If you do not have a mask, one will be given to you upon arrival. For doctor visits, patients may have 1 support person aged 57 or older with them. For treatment visits, patients cannot have anyone with them due to current Covid guidelines and our immunocompromised population.  ?

## 2021-06-24 NOTE — Progress Notes (Signed)
Pt states she has been feeling pain in her ankles ,legs, and knees going on a week or so.  ?

## 2021-06-28 ENCOUNTER — Other Ambulatory Visit: Payer: Self-pay

## 2021-06-29 ENCOUNTER — Encounter: Payer: Self-pay | Admitting: Oncology

## 2021-06-29 ENCOUNTER — Other Ambulatory Visit: Payer: Self-pay

## 2021-06-29 ENCOUNTER — Other Ambulatory Visit: Payer: Self-pay | Admitting: Hospice and Palliative Medicine

## 2021-06-29 ENCOUNTER — Telehealth: Payer: Self-pay | Admitting: *Deleted

## 2021-06-29 MED ORDER — MORPHINE SULFATE ER 15 MG PO TBCR
15.0000 mg | EXTENDED_RELEASE_TABLET | Freq: Two times a day (BID) | ORAL | 0 refills | Status: DC
  Filled 2021-06-29: qty 60, 30d supply, fill #0

## 2021-06-29 NOTE — Telephone Encounter (Signed)
Pt called to day she needs refill of the morphine. Sophia [name] has refilled the med and she can pick it up at pharmacy ?

## 2021-07-01 ENCOUNTER — Other Ambulatory Visit: Payer: Self-pay

## 2021-07-14 ENCOUNTER — Encounter: Payer: Self-pay | Admitting: Licensed Clinical Social Worker

## 2021-07-14 NOTE — Progress Notes (Signed)
[facility] CSW Progress Note ? ?Clinical Social Worker  emailed diagnosis documentation to [employer]  to complete application for financial assistance. Spoke with [place] at [facility].  . ? ? ? ?Nataline Basara , LCSW ?

## 2021-07-15 ENCOUNTER — Encounter: Payer: Self-pay | Admitting: Oncology

## 2021-07-15 ENCOUNTER — Other Ambulatory Visit: Payer: Self-pay

## 2021-07-15 ENCOUNTER — Inpatient Hospital Stay: Payer: Self-pay

## 2021-07-15 VITALS — BP 117/68 | HR 82 | Temp 96.8°F | Resp 18 | Ht 62.0 in | Wt 146.4 lb

## 2021-07-15 DIAGNOSIS — M8440XA Pathological fracture, unspecified site, initial encounter for fracture: Secondary | ICD-10-CM

## 2021-07-15 DIAGNOSIS — C50919 Malignant neoplasm of unspecified site of unspecified female breast: Secondary | ICD-10-CM

## 2021-07-15 LAB — CBC WITH DIFFERENTIAL/PLATELET
Abs Immature Granulocytes: 0.02 10*3/uL (ref 0.00–0.07)
Basophils Absolute: 0 10*3/uL (ref 0.0–0.1)
Basophils Relative: 1 %
Eosinophils Absolute: 0.2 10*3/uL (ref 0.0–0.5)
Eosinophils Relative: 3 %
HCT: 38.5 % (ref 36.0–46.0)
Hemoglobin: 12.5 g/dL (ref 12.0–15.0)
Immature Granulocytes: 0 %
Lymphocytes Relative: 21 %
Lymphs Abs: 1 10*3/uL (ref 0.7–4.0)
MCH: 28 pg (ref 26.0–34.0)
MCHC: 32.5 g/dL (ref 30.0–36.0)
MCV: 86.3 fL (ref 80.0–100.0)
Monocytes Absolute: 0.3 10*3/uL (ref 0.1–1.0)
Monocytes Relative: 6 %
Neutro Abs: 3.3 10*3/uL (ref 1.7–7.7)
Neutrophils Relative %: 69 %
Platelets: 321 10*3/uL (ref 150–400)
RBC: 4.46 MIL/uL (ref 3.87–5.11)
RDW: 13.1 % (ref 11.5–15.5)
WBC: 4.9 10*3/uL (ref 4.0–10.5)
nRBC: 0 % (ref 0.0–0.2)

## 2021-07-15 LAB — COMPREHENSIVE METABOLIC PANEL
ALT: 16 U/L (ref 0–44)
AST: 30 U/L (ref 15–41)
Albumin: 3.6 g/dL (ref 3.5–5.0)
Alkaline Phosphatase: 71 U/L (ref 38–126)
Anion gap: 6 (ref 5–15)
BUN: 8 mg/dL (ref 6–20)
CO2: 25 mmol/L (ref 22–32)
Calcium: 8.7 mg/dL — ABNORMAL LOW (ref 8.9–10.3)
Chloride: 103 mmol/L (ref 98–111)
Creatinine, Ser: 0.58 mg/dL (ref 0.44–1.00)
GFR, Estimated: >60 mL/min (ref >60)
Glucose, Bld: 122 mg/dL — ABNORMAL HIGH (ref 70–99)
Potassium: 3.1 mmol/L — ABNORMAL LOW (ref 3.5–5.1)
Sodium: 134 mmol/L — ABNORMAL LOW (ref 135–145)
Total Bilirubin: 0.6 mg/dL (ref 0.3–1.2)
Total Protein: 7.3 g/dL (ref 6.5–8.1)

## 2021-07-15 MED ORDER — MORPHINE SULFATE (PF) 2 MG/ML IV SOLN
2.0000 mg | Freq: Once | INTRAVENOUS | Status: AC
  Administered 2021-07-15: 2 mg via INTRAVENOUS
  Filled 2021-07-15: qty 1

## 2021-07-15 MED ORDER — DIPHENHYDRAMINE HCL 50 MG/ML IJ SOLN
50.0000 mg | Freq: Once | INTRAMUSCULAR | Status: AC
  Administered 2021-07-15: 50 mg via INTRAVENOUS
  Filled 2021-07-15: qty 1

## 2021-07-15 MED ORDER — POTASSIUM CHLORIDE IN NACL 20-0.9 MEQ/L-% IV SOLN
INTRAVENOUS | Status: DC
  Filled 2021-07-15 (×2): qty 1000

## 2021-07-15 MED ORDER — TRASTUZUMAB-ANNS CHEMO 150 MG IV SOLR
400.0000 mg | Freq: Once | INTRAVENOUS | Status: AC
  Administered 2021-07-15: 400 mg via INTRAVENOUS
  Filled 2021-07-15: qty 19.05

## 2021-07-15 MED ORDER — SODIUM CHLORIDE 0.9 % IV SOLN
420.0000 mg | Freq: Once | INTRAVENOUS | Status: AC
  Administered 2021-07-15: 420 mg via INTRAVENOUS
  Filled 2021-07-15: qty 14

## 2021-07-15 MED ORDER — ACETAMINOPHEN 325 MG PO TABS
650.0000 mg | ORAL_TABLET | Freq: Once | ORAL | Status: AC
  Administered 2021-07-15: 650 mg via ORAL
  Filled 2021-07-15: qty 2

## 2021-07-15 MED ORDER — HEPARIN SOD (PORK) LOCK FLUSH 100 UNIT/ML IV SOLN
500.0000 [IU] | Freq: Once | INTRAVENOUS | Status: AC | PRN
  Administered 2021-07-15: 500 [IU]
  Filled 2021-07-15: qty 5

## 2021-07-15 MED ORDER — SODIUM CHLORIDE 0.9 % IV SOLN
Freq: Once | INTRAVENOUS | Status: AC
  Filled 2021-07-15: qty 250

## 2021-07-15 NOTE — Progress Notes (Signed)
Pts concern with tingling after infusions discussed with Dr Janese Banks. MSO4 '2mg'$  to be given 15 prior to end of infusion. ?

## 2021-07-15 NOTE — Patient Instructions (Signed)
[facility] AT [place]  Discharge Instructions: ?Thank you for choosing [facility] to provide your oncology and hematology care.  ?If you have a lab appointment with the [facility], please go directly to the [facility] and check in at the registration area. ? ?Wear comfortable clothing and clothing appropriate for easy access to any Portacath or PICC line.  ? ?We strive to give you quality time with your provider. You may need to reschedule your appointment if you arrive late (15 or more minutes).  Arriving late affects you and other patients whose appointments are after yours.  Also, if you miss three or more appointments without notifying the office, you may be dismissed from the clinic at the provider?s discretion.    ?  ?For prescription refill requests, have your pharmacy contact our office and allow 72 hours for refills to be completed.   ? ?Today you received the following chemotherapy and/or immunotherapy agents KANJINTI and [place]    ?  ?To help prevent nausea and vomiting after your treatment, we encourage you to take your nausea medication as directed. ? ?BELOW ARE SYMPTOMS THAT SHOULD BE REPORTED IMMEDIATELY: ?*FEVER GREATER THAN 100.4 F (38 ?C) OR HIGHER ?*CHILLS OR SWEATING ?*NAUSEA AND VOMITING THAT IS NOT CONTROLLED WITH YOUR NAUSEA MEDICATION ?*UNUSUAL SHORTNESS OF BREATH ?*UNUSUAL BRUISING OR BLEEDING ?*URINARY PROBLEMS (pain or burning when urinating, or frequent urination) ?*BOWEL PROBLEMS (unusual diarrhea, constipation, pain near the anus) ?TENDERNESS IN MOUTH AND THROAT WITH OR WITHOUT PRESENCE OF ULCERS (sore throat, sores in mouth, or a toothache) ?UNUSUAL RASH, SWELLING OR PAIN  ?UNUSUAL VAGINAL DISCHARGE OR ITCHING  ? ?Items with * indicate a potential emergency and should be followed up as soon as possible or go to the Emergency Department if any problems should occur. ? ?Please show the CHEMOTHERAPY ALERT CARD or IMMUNOTHERAPY ALERT CARD at  check-in to the Emergency Department and triage nurse. ? ?Should you have questions after your visit or need to cancel or reschedule your appointment, please contact [facility] [place] AT [place]  [phone] and follow the prompts.  Office hours are 8:00 a.m. to 4:30 p.m. Monday - Friday. Please note that voicemails left after 4:00 p.m. may not be returned until the following business day.  We are closed weekends and major holidays. You have access to a nurse at all times for urgent questions. Please call the main number to the clinic [phone] and follow the prompts. ? ?For any non-urgent questions, you may also contact your provider using MyChart. We now offer e-Visits for anyone 60 and older to request care online for non-urgent symptoms. For details visit [email or web address]. ?  ?Also download the MyChart app! Go to the app store, search "MyChart", open the app, select [facility], and log in with your MyChart username and password. ? ?Due to Covid, a mask is required upon entering the hospital/clinic. If you do not have a mask, one will be given to you upon arrival. For doctor visits, patients may have 1 support person aged 56 or older with them. For treatment visits, patients cannot have anyone with them due to current Covid guidelines and our immunocompromised population.  ? ?Trastuzumab injection for infusion ?What is this medication? ?TRASTUZUMAB (tras TOO zoo mab) is a monoclonal antibody. It is used to treat breast cancer and stomach cancer. ?This medicine may be used for other purposes; ask your health care provider or pharmacist if you have questions. ?COMMON BRAND NAME(S): Herceptin, Belenda Cruise, Ogivri,  Chalmers Guest ?What should I tell my care team before I take this medication? ?They need to know if you have any of these conditions: ?heart disease ?heart failure ?lung or breathing disease, like asthma ?an unusual or allergic reaction to trastuzumab, benzyl  alcohol, or other medications, foods, dyes, or preservatives ?pregnant or trying to get pregnant ?breast-feeding ?How should I use this medication? ?This drug is given as an infusion into a vein. It is administered in a hospital or clinic by a specially trained health care professional. ?Talk to your pediatrician regarding the use of this medicine in children. This medicine is not approved for use in children. ?Overdosage: If you think you have taken too much of this medicine contact a poison control center or emergency room at once. ?NOTE: This medicine is only for you. Do not share this medicine with others. ?What if I miss a dose? ?It is important not to miss a dose. Call your doctor or health care professional if you are unable to keep an appointment. ?What may interact with this medication? ?This medicine may interact with the following medications: ?certain types of chemotherapy, such as daunorubicin, doxorubicin, epirubicin, and idarubicin ?This list may not describe all possible interactions. Give your health care provider a list of all the medicines, herbs, non-prescription drugs, or dietary supplements you use. Also tell them if you smoke, drink alcohol, or use illegal drugs. Some items may interact with your medicine. ?What should I watch for while using this medication? ?Visit your doctor for checks on your progress. Report any side effects. Continue your course of treatment even though you feel ill unless your doctor tells you to stop. ?Call your doctor or health care professional for advice if you get a fever, chills or sore throat, or other symptoms of a cold or flu. Do not treat yourself. Try to avoid being around people who are sick. ?You may experience fever, chills and shaking during your first infusion. These effects are usually mild and can be treated with other medicines. Report any side effects during the infusion to your health care professional. Fever and chills usually do not happen with  later infusions. ?Do not become pregnant while taking this medicine or for 7 months after stopping it. Women should inform their doctor if they wish to become pregnant or think they might be pregnant. Women of child-bearing potential will need to have a negative pregnancy test before starting this medicine. There is a potential for serious side effects to an unborn child. Talk to your health care professional or pharmacist for more information. Do not breast-feed an infant while taking this medicine or for 7 months after stopping it. ?Women must use effective birth control with this medicine. ?What side effects may I notice from receiving this medication? ?Side effects that you should report to your doctor or health care professional as soon as possible: ?allergic reactions like skin rash, itching or hives, swelling of the face, lips, or tongue ?chest pain or palpitations ?cough ?dizziness ?feeling faint or lightheaded, falls ?fever ?general ill feeling or flu-like symptoms ?signs of worsening heart failure like breathing problems; swelling in your legs and feet ?unusually weak or tired ?Side effects that usually do not require medical attention (report to your doctor or health care professional if they continue or are bothersome): ?bone pain ?changes in taste ?diarrhea ?joint pain ?nausea/vomiting ?weight loss ?This list may not describe all possible side effects. Call your doctor for medical advice about side effects. You may report side  effects to FDA at 1-800-FDA-1088. ?Where should I keep my medication? ?This drug is given in a hospital or clinic and will not be stored at home. ?NOTE: This sheet is a summary. It may not cover all possible information. If you have questions about this medicine, talk to your doctor, pharmacist, or health care provider. ?? 2023 Elsevier/Gold Standard (2016-03-21 00:00:00) ? ?Pertuzumab injection ?What is this medication? ?PERTUZUMAB (per TOOZ ue mab) is a monoclonal antibody. It  is used to treat breast cancer. ?This medicine may be used for other purposes; ask your health care provider or pharmacist if you have questions. ?COMMON BRAND NAME(S): PERJETA ?What should I tell my care team b

## 2021-07-18 ENCOUNTER — Inpatient Hospital Stay: Payer: Self-pay | Attending: Oncology | Admitting: Licensed Clinical Social Worker

## 2021-07-18 DIAGNOSIS — Z5111 Encounter for antineoplastic chemotherapy: Secondary | ICD-10-CM | POA: Insufficient documentation

## 2021-07-18 DIAGNOSIS — C7951 Secondary malignant neoplasm of bone: Secondary | ICD-10-CM | POA: Insufficient documentation

## 2021-07-18 DIAGNOSIS — Z171 Estrogen receptor negative status [ER-]: Secondary | ICD-10-CM | POA: Insufficient documentation

## 2021-07-18 DIAGNOSIS — C7931 Secondary malignant neoplasm of brain: Secondary | ICD-10-CM | POA: Insufficient documentation

## 2021-07-18 DIAGNOSIS — Z5112 Encounter for antineoplastic immunotherapy: Secondary | ICD-10-CM | POA: Insufficient documentation

## 2021-07-18 DIAGNOSIS — C50412 Malignant neoplasm of upper-outer quadrant of left female breast: Secondary | ICD-10-CM | POA: Insufficient documentation

## 2021-07-28 ENCOUNTER — Encounter
Admission: RE | Admit: 2021-07-28 | Discharge: 2021-07-28 | Disposition: A | Discharge status: Home / Self Care | Payer: Self-pay | Source: Ambulatory Visit | Attending: Oncology | Admitting: Oncology

## 2021-07-28 ENCOUNTER — Ambulatory Visit
Admission: RE | Admit: 2021-07-28 | Discharge: 2021-07-28 | Disposition: A | Discharge status: Home / Self Care | Payer: Self-pay | Source: Ambulatory Visit | Attending: Oncology | Admitting: Oncology

## 2021-07-28 DIAGNOSIS — C50912 Malignant neoplasm of unspecified site of left female breast: Secondary | ICD-10-CM

## 2021-07-28 IMAGING — CT CT CHEST-ABD-PELV W/ CM
2 of 5 series · 13 of 36 positions shown, 15 images · IV contrast (agent unspecified)
Comparison: [DATE]

CLINICAL DATA: Metastatic breast cancer, assess treatment response
* Tracking Code: BO *

EXAM:
CT CHEST, ABDOMEN, AND PELVIS WITH CONTRAST
TECHNIQUE: Multidetector CT imaging of the chest, abdomen and pelvis was
performed following the standard protocol during bolus
administration of intravenous contrast.

[Series 2: cap with · axial · 0.82mm/px · z∈[-332,+118]mm · 10 of 112 slices shown, 12 images]
[im 11/112  mediastinal]
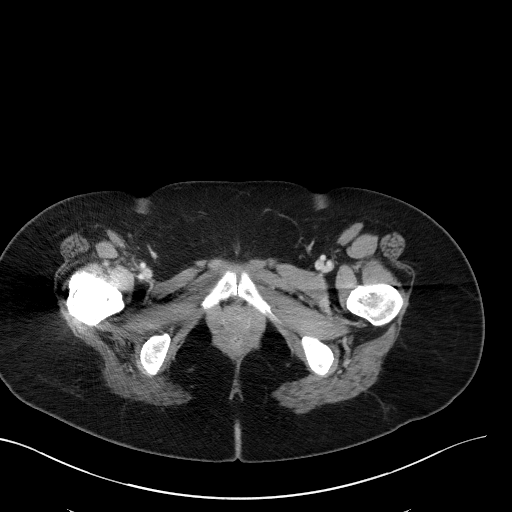
[im 11/112  bone]
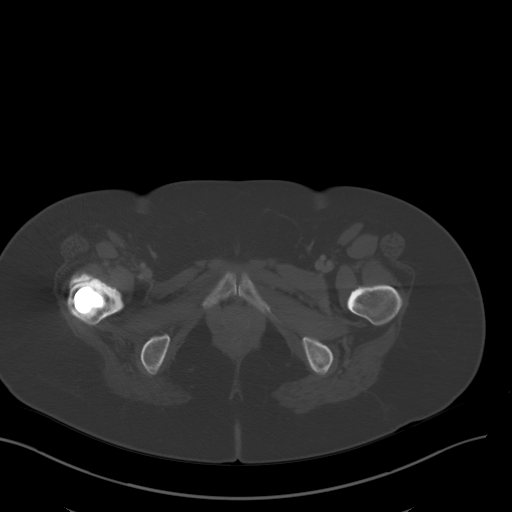
[im 21/112  mediastinal]
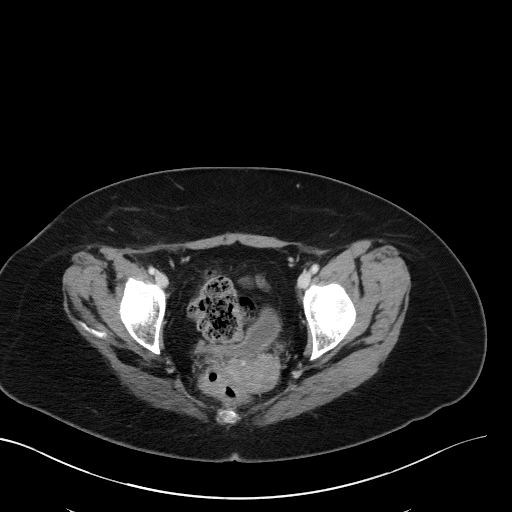
[im 31/112  mediastinal]
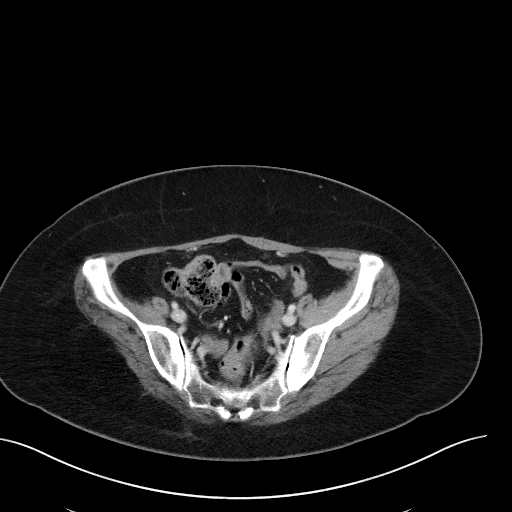
[im 41/112  mediastinal]
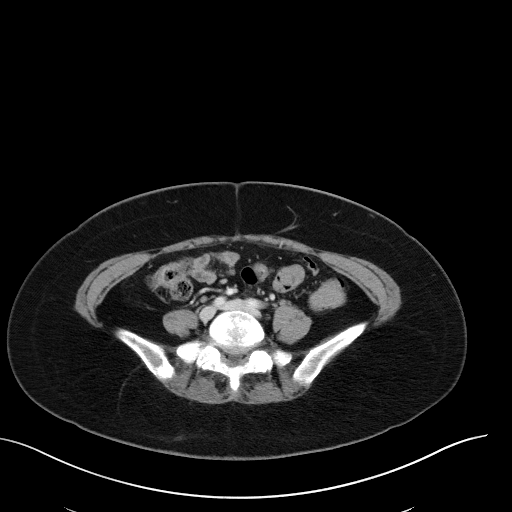
[im 51/112  mediastinal]
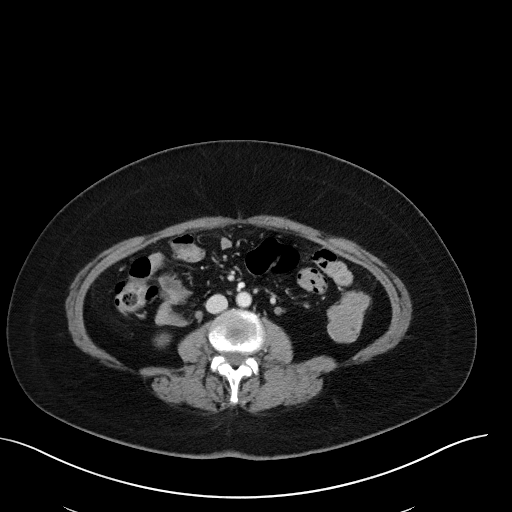
[im 61/112  mediastinal]
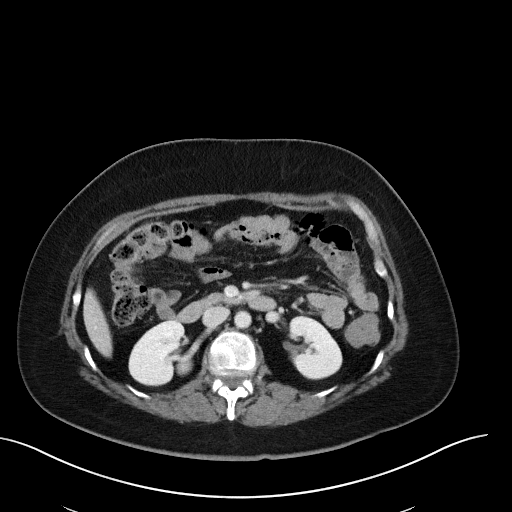
[im 71/112  mediastinal]
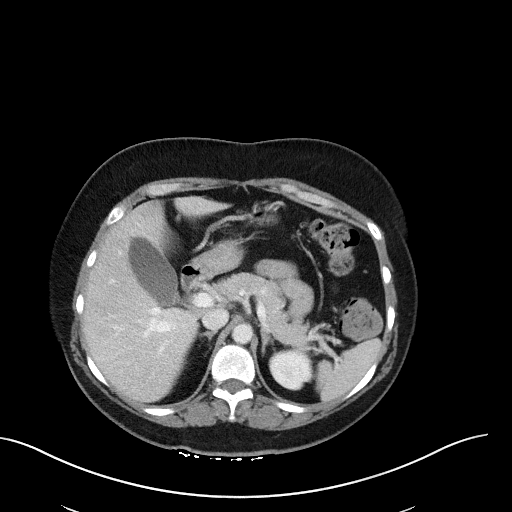
[im 81/112  mediastinal]
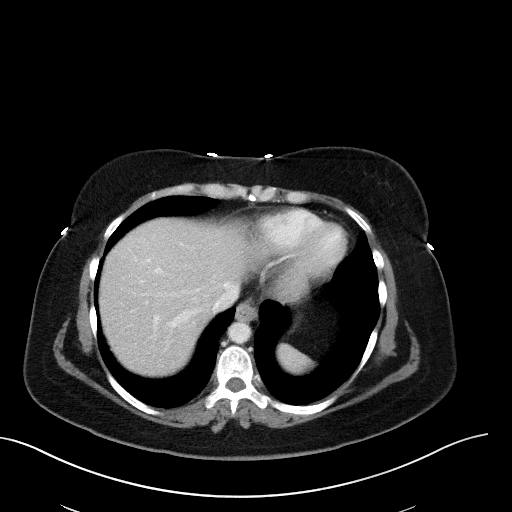
[im 91/112  mediastinal]
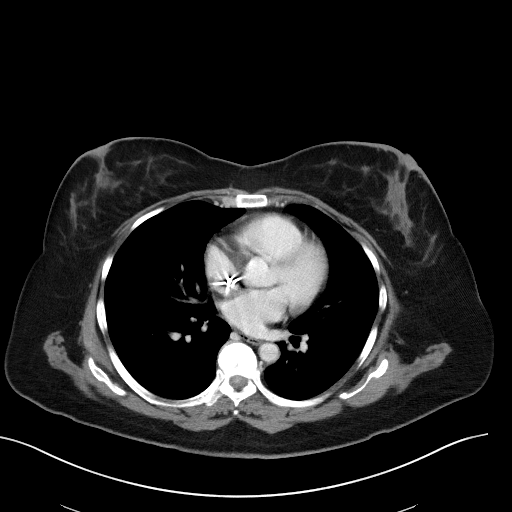
[im 91/112  bone]
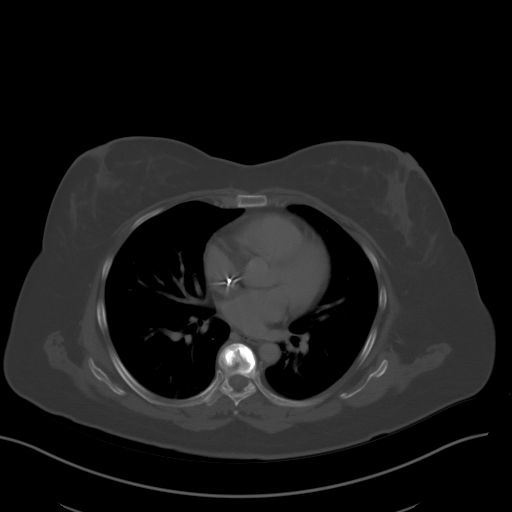
[im 101/112  mediastinal]
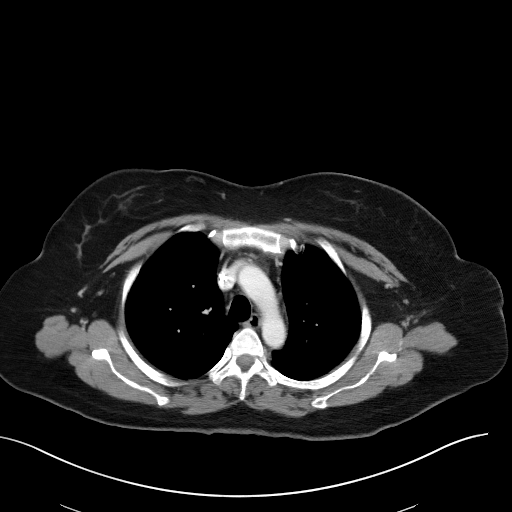

[Series 6: coronals · coronal · 0.69mm/px · 3 of 134 slices shown]
[im 27/134  mediastinal]
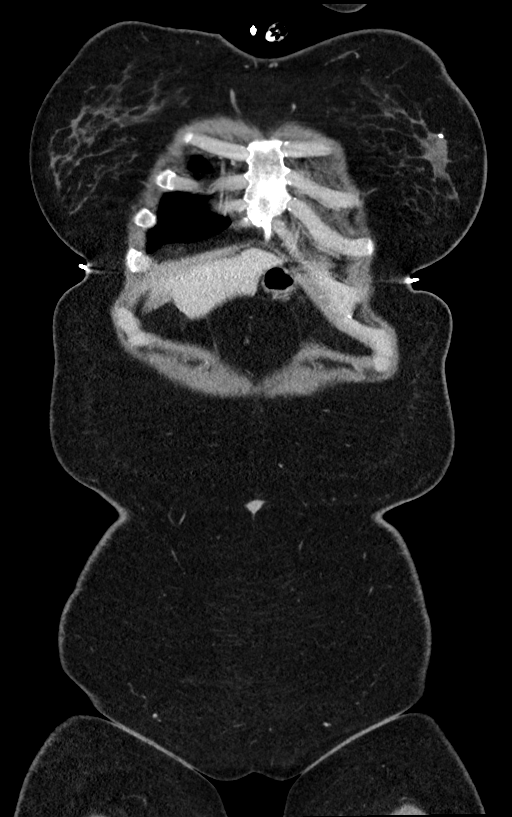
[im 54/134  mediastinal]
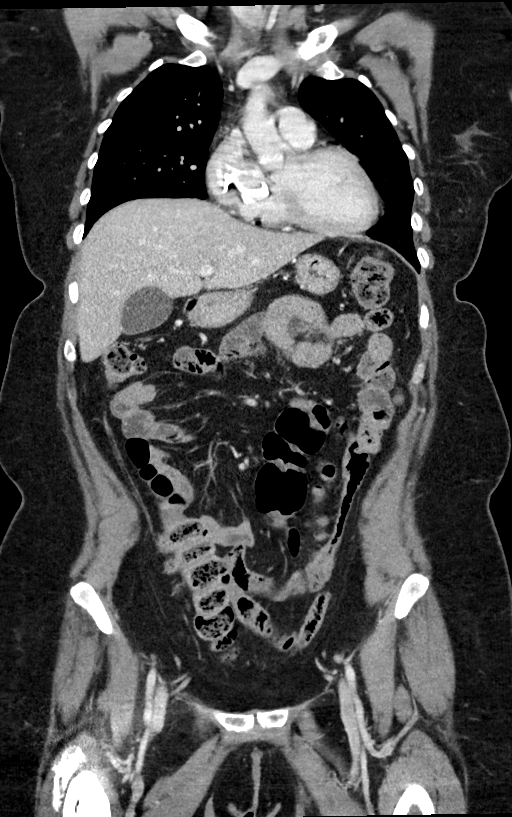
[im 80/134  mediastinal]
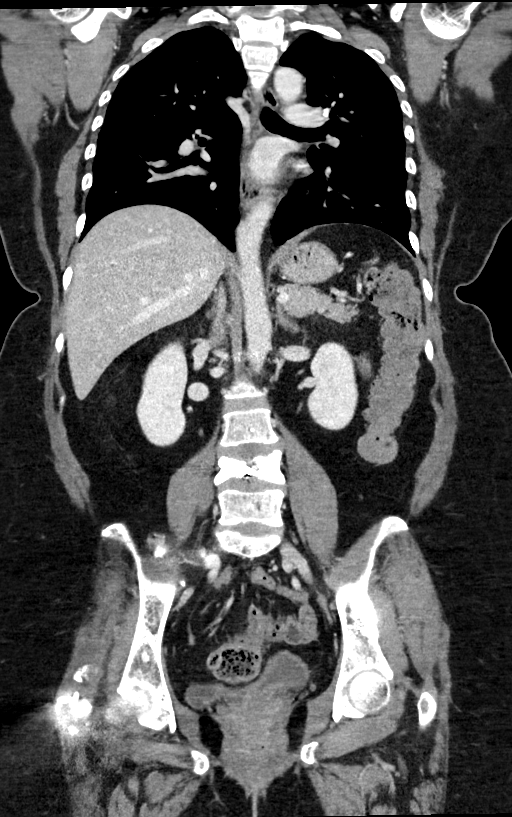

[13 of 36 positions shown; findings below may reference images not displayed]

RADIATION DOSE REDUCTION: This exam was performed according to the
departmental dose-optimization program which includes automated
exposure control, adjustment of the mA and/or kV according to
patient size and/or use of iterative reconstruction technique.

CONTRAST:  100mL OMNIPAQUE IOHEXOL 300 MG/ML SOLN, additional oral
enteric contrast
FINDINGS: CT CHEST FINDINGS

Cardiovascular: Right chest port catheter. Normal heart size. No
pericardial effusion.

Mediastinum/Nodes: No enlarged mediastinal, hilar, or axillary lymph
nodes. Thyroid gland, trachea, and esophagus demonstrate no
significant findings.

Lungs/Pleura: Lungs are clear. No pleural effusion or pneumothorax.

Musculoskeletal: No chest wall mass.

CT ABDOMEN PELVIS FINDINGS

Hepatobiliary: No solid liver abnormality is seen. No gallstones,
gallbladder wall thickening, or biliary dilatation.

Pancreas: Unremarkable. No pancreatic ductal dilatation or
surrounding inflammatory changes.

Spleen: Normal in size without significant abnormality.

Adrenals/Urinary Tract: Adrenal glands are unremarkable. Kidneys are
normal, without renal calculi, solid lesion, or hydronephrosis.
Bladder is unremarkable.

Stomach/Bowel: Stomach is within normal limits. Appendix appears
normal. No evidence of bowel wall thickening, distention, or
inflammatory changes.

Vascular/Lymphatic: No significant vascular findings are present. No
enlarged abdominal or pelvic lymph nodes.

Reproductive: No mass or other abnormality.

Other: No abdominal wall hernia or abnormality. No ascites.

Musculoskeletal: No acute osseous findings. Multiple small sclerotic
osseous lesions throughout the included axial skeleton are
unchanged, for example of the manubrium (series 7, image 121), and
the T8, T10, and L2 vertebral bodies (series 7, image 112). Wedge
deformities of T6, L1, and L4 status post status post cement
augmentation, as well as cement augmentation of the right sacral ala
and ilium (series 2, image 77). Status intramedullary nail fixation
of the right hip.
IMPRESSION: 1. Unchanged sclerotic osseous metastatic disease. Status post
cement augmentation of T6, L1, and L4, as well as of the right
sacral ala and right ilium.
2. No evidence of lymphadenopathy or soft tissue metastatic disease
in the chest, abdomen, or pelvis.

## 2021-07-28 IMAGING — NM NM BONE WHOLE BODY
2 series · 12 of 12 positions shown · non-contrast
Comparison: CT chest abdomen pelvis [DATE], bone scan
[DATE]

CLINICAL DATA: Metastatic left breast cancer

EXAM:
NUCLEAR MEDICINE WHOLE BODY BONE SCAN
TECHNIQUE: Whole body anterior and posterior images were obtained approximately
3 hours after intravenous injection of radiopharmaceutical.
RADIOPHARMACEUTICALS:  20.13 mCi [J4] MDP IV

[Series 1000: statics · 2.40mm/px · 5 acquisitions, 10 frames shown]
[im 1/5]
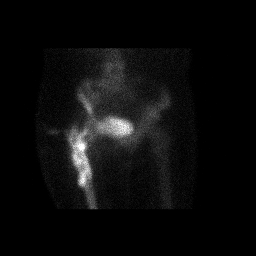
[im 1/5]
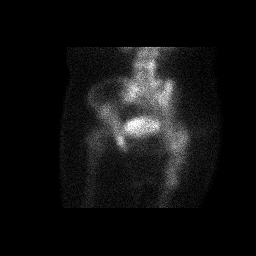
[im 2/5]
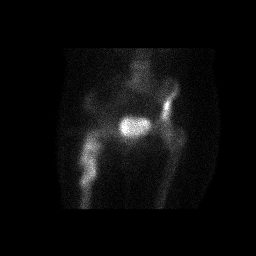
[im 2/5]
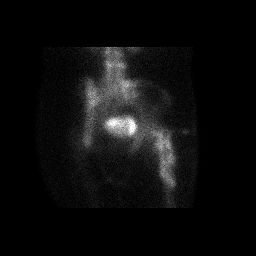
[im 3/5]
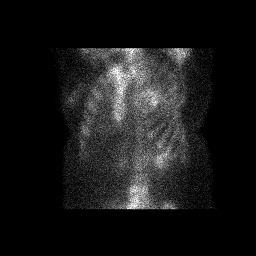
[im 3/5]
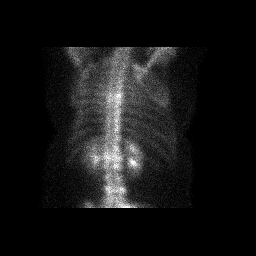
[im 4/5]
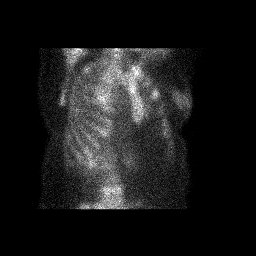
[im 4/5]
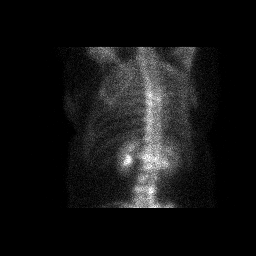
[im 5/5]
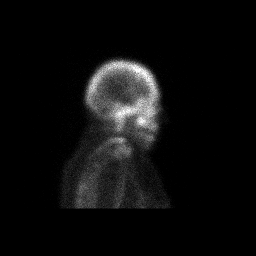
[im 5/5]
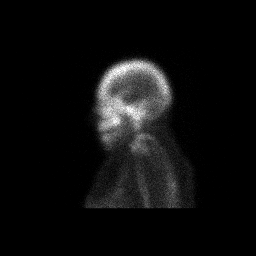

[Series 1000: 3 hr wholebody · 2.40mm/px · 2 of 2 frames shown]
[frame 1/2]
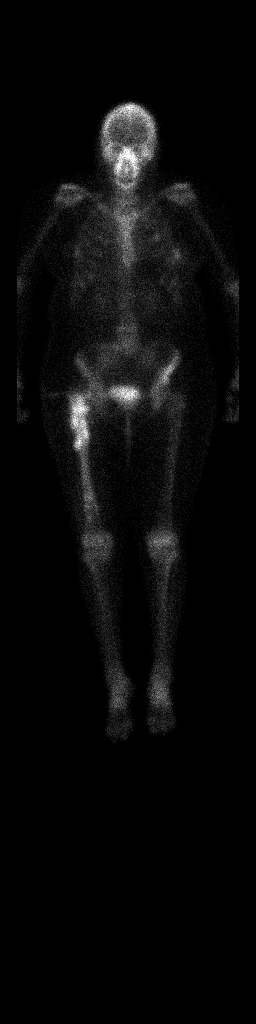
[frame 2/2]
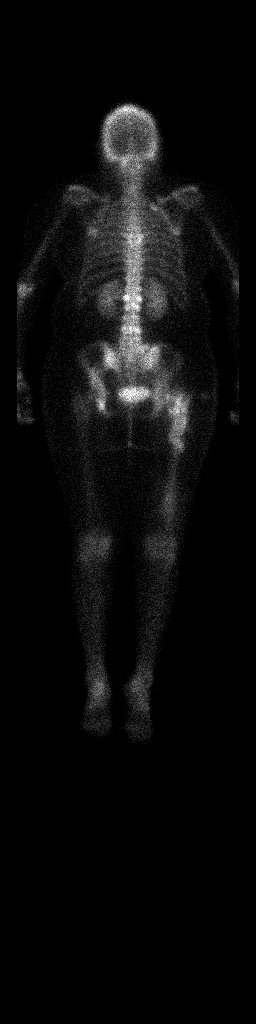

[12 of 12 positions shown; findings below may reference images not displayed]

FINDINGS: Anterior and posterior whole body planar images as well as spot
images of the pelvis, thoracic cage, and calvarium are obtained.
There is physiologic excretion of radiotracer within the kidneys and
bladder.

Persistent increased radiotracer uptake within the proximal right
femur, which may reflect a combination of prior healed trauma and
postsurgical change. Underlying metastatic disease would be
difficult to exclude, but the appearance is stable since prior
study. Activity within the left anterior third rib consistent with
prior healed fracture, stable.

Stable increased radiotracer uptake is seen within the left ilium
consistent with metastatic disease. Stable activity within the
midthoracic spine, L1, and L4 regions consistent with vertebral
augmentation. Activity at L2 consistent with known bony metastases.

Activity within the right posterior second rib seen previously
slightly less pronounced on this exam, consistent with known focus
of metastatic disease.
IMPRESSION: 1. Stable bone metastases within the left ilium and right posterior
second rib.
2. Continued activity within the midthoracic spine and lumbar spine
as above, consistent with a combination of metastatic disease and
post therapeutic change.
3. Increased activity within the right femur, consistent with a
combination of posttraumatic and postsurgical change. Underlying
metastatic disease would be difficult to exclude.
4. No new bony metastases.

## 2021-07-28 MED ORDER — IOHEXOL 300 MG/ML  SOLN
100.0000 mL | Freq: Once | INTRAMUSCULAR | Status: AC | PRN
  Administered 2021-07-28: 100 mL via INTRAVENOUS

## 2021-07-28 MED ORDER — TECHNETIUM TC 99M MEDRONATE IV KIT
20.0000 | PACK | Freq: Once | INTRAVENOUS | Status: AC | PRN
  Administered 2021-07-28: 20.13 via INTRAVENOUS

## 2021-07-28 MED ORDER — HEPARIN SOD (PORK) LOCK FLUSH 100 UNIT/ML IV SOLN
500.0000 [IU] | Freq: Once | INTRAVENOUS | Status: AC
  Administered 2021-07-28: 500 [IU] via INTRAVENOUS
  Filled 2021-07-28: qty 5

## 2021-07-28 MED ORDER — HEPARIN SOD (PORK) LOCK FLUSH 100 UNIT/ML IV SOLN
INTRAVENOUS | Status: AC
  Filled 2021-07-28: qty 5

## 2021-07-29 ENCOUNTER — Other Ambulatory Visit: Payer: Self-pay

## 2021-07-29 ENCOUNTER — Encounter: Payer: Self-pay | Admitting: Oncology

## 2021-08-01 ENCOUNTER — Ambulatory Visit
Admission: RE | Admit: 2021-08-01 | Discharge: 2021-08-01 | Disposition: A | Discharge status: Home / Self Care | Payer: Self-pay | Source: Ambulatory Visit | Attending: Oncology | Admitting: Oncology

## 2021-08-01 DIAGNOSIS — C50912 Malignant neoplasm of unspecified site of left female breast: Secondary | ICD-10-CM | POA: Insufficient documentation

## 2021-08-01 LAB — ECHOCARDIOGRAM COMPLETE
AR max vel: 2.1 cm2
AV Area VTI: 2.61 cm2
AV Area mean vel: 2.11 cm2
AV Mean grad: 2 mmHg
AV Peak grad: 3.2 mmHg
Ao pk vel: 0.89 m/s
Area-P 1/2: 4.21 cm2
MV VTI: 2.14 cm2
S' Lateral: 2.2 cm

## 2021-08-01 NOTE — Progress Notes (Signed)
*  PRELIMINARY RESULTS* ?Echocardiogram ?2D Echocardiogram has been performed. ? ?Fern Canova, Sonia Side ?08/01/2021, 10:51 AM ?

## 2021-08-05 ENCOUNTER — Encounter: Payer: Self-pay | Admitting: Oncology

## 2021-08-05 ENCOUNTER — Other Ambulatory Visit: Payer: Self-pay

## 2021-08-05 ENCOUNTER — Inpatient Hospital Stay (HOSPITAL_BASED_OUTPATIENT_CLINIC_OR_DEPARTMENT_OTHER): Payer: Self-pay | Admitting: Oncology

## 2021-08-05 ENCOUNTER — Inpatient Hospital Stay: Payer: Self-pay

## 2021-08-05 VITALS — BP 112/80 | HR 79 | Temp 97.5°F | Resp 18 | Wt 146.7 lb

## 2021-08-05 DIAGNOSIS — R300 Dysuria: Secondary | ICD-10-CM

## 2021-08-05 DIAGNOSIS — C50919 Malignant neoplasm of unspecified site of unspecified female breast: Secondary | ICD-10-CM

## 2021-08-05 DIAGNOSIS — Z5181 Encounter for therapeutic drug level monitoring: Secondary | ICD-10-CM

## 2021-08-05 DIAGNOSIS — Z5112 Encounter for antineoplastic immunotherapy: Secondary | ICD-10-CM

## 2021-08-05 DIAGNOSIS — Z79899 Other long term (current) drug therapy: Secondary | ICD-10-CM

## 2021-08-05 DIAGNOSIS — M8440XA Pathological fracture, unspecified site, initial encounter for fracture: Secondary | ICD-10-CM

## 2021-08-05 LAB — URINALYSIS, COMPLETE (UACMP) WITH MICROSCOPIC
Bilirubin Urine: NEGATIVE
Glucose, UA: NEGATIVE mg/dL
Hgb urine dipstick: NEGATIVE
Ketones, ur: NEGATIVE mg/dL
Nitrite: NEGATIVE
Protein, ur: NEGATIVE mg/dL
Specific Gravity, Urine: 1.019 (ref 1.005–1.030)
Squamous Epithelial / HPF: NONE SEEN (ref 0–5)
WBC, UA: >50 WBC/hpf — ABNORMAL HIGH (ref 0–5)
pH: 5 (ref 5.0–8.0)

## 2021-08-05 LAB — CBC WITH DIFFERENTIAL/PLATELET
Abs Immature Granulocytes: 0.02 10*3/uL (ref 0.00–0.07)
Basophils Absolute: 0 10*3/uL (ref 0.0–0.1)
Basophils Relative: 0 %
Eosinophils Absolute: 0.2 10*3/uL (ref 0.0–0.5)
Eosinophils Relative: 3 %
HCT: 39.6 % (ref 36.0–46.0)
Hemoglobin: 13.1 g/dL (ref 12.0–15.0)
Immature Granulocytes: 0 %
Lymphocytes Relative: 27 %
Lymphs Abs: 1.3 10*3/uL (ref 0.7–4.0)
MCH: 27.9 pg (ref 26.0–34.0)
MCHC: 33.1 g/dL (ref 30.0–36.0)
MCV: 84.4 fL (ref 80.0–100.0)
Monocytes Absolute: 0.4 10*3/uL (ref 0.1–1.0)
Monocytes Relative: 8 %
Neutro Abs: 2.8 10*3/uL (ref 1.7–7.7)
Neutrophils Relative %: 62 %
Platelets: 323 10*3/uL (ref 150–400)
RBC: 4.69 MIL/uL (ref 3.87–5.11)
RDW: 13.2 % (ref 11.5–15.5)
WBC: 4.6 10*3/uL (ref 4.0–10.5)
nRBC: 0 % (ref 0.0–0.2)

## 2021-08-05 LAB — COMPREHENSIVE METABOLIC PANEL
ALT: 18 U/L (ref 0–44)
AST: 23 U/L (ref 15–41)
Albumin: 3.8 g/dL (ref 3.5–5.0)
Alkaline Phosphatase: 69 U/L (ref 38–126)
Anion gap: 8 (ref 5–15)
BUN: 10 mg/dL (ref 6–20)
CO2: 26 mmol/L (ref 22–32)
Calcium: 8.7 mg/dL — ABNORMAL LOW (ref 8.9–10.3)
Chloride: 102 mmol/L (ref 98–111)
Creatinine, Ser: 0.43 mg/dL — ABNORMAL LOW (ref 0.44–1.00)
GFR, Estimated: >60 mL/min (ref >60)
Glucose, Bld: 96 mg/dL (ref 70–99)
Potassium: 3.8 mmol/L (ref 3.5–5.1)
Sodium: 136 mmol/L (ref 135–145)
Total Bilirubin: 0.6 mg/dL (ref 0.3–1.2)
Total Protein: 7.5 g/dL (ref 6.5–8.1)

## 2021-08-05 MED ORDER — SODIUM CHLORIDE 0.9 % IV SOLN
420.0000 mg | Freq: Once | INTRAVENOUS | Status: AC
  Administered 2021-08-05: 420 mg via INTRAVENOUS
  Filled 2021-08-05: qty 14

## 2021-08-05 MED ORDER — SODIUM CHLORIDE 0.9 % IV SOLN
Freq: Once | INTRAVENOUS | Status: AC
  Filled 2021-08-05: qty 250

## 2021-08-05 MED ORDER — HEPARIN SOD (PORK) LOCK FLUSH 100 UNIT/ML IV SOLN
500.0000 [IU] | Freq: Once | INTRAVENOUS | Status: AC | PRN
  Administered 2021-08-05: 500 [IU]
  Filled 2021-08-05: qty 5

## 2021-08-05 MED ORDER — ACETAMINOPHEN 325 MG PO TABS
650.0000 mg | ORAL_TABLET | Freq: Once | ORAL | Status: AC
  Administered 2021-08-05: 650 mg via ORAL
  Filled 2021-08-05: qty 2

## 2021-08-05 MED ORDER — MORPHINE SULFATE (PF) 2 MG/ML IV SOLN
2.0000 mg | Freq: Once | INTRAVENOUS | Status: AC
  Administered 2021-08-05: 2 mg via INTRAVENOUS
  Filled 2021-08-05: qty 1

## 2021-08-05 MED ORDER — DIPHENHYDRAMINE HCL 50 MG/ML IJ SOLN
50.0000 mg | Freq: Once | INTRAMUSCULAR | Status: AC
  Administered 2021-08-05: 50 mg via INTRAVENOUS
  Filled 2021-08-05: qty 1

## 2021-08-05 MED ORDER — SULFAMETHOXAZOLE-TRIMETHOPRIM 800-160 MG PO TABS
1.0000 | ORAL_TABLET | Freq: Two times a day (BID) | ORAL | 0 refills | Status: DC
  Filled 2021-08-05: qty 14, 7d supply, fill #0

## 2021-08-05 MED ORDER — TRASTUZUMAB-ANNS CHEMO 150 MG IV SOLR
6.0000 mg/kg | Freq: Once | INTRAVENOUS | Status: AC
  Administered 2021-08-05: 357 mg via INTRAVENOUS
  Filled 2021-08-05: qty 17

## 2021-08-05 NOTE — Progress Notes (Signed)
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  History obtained with the help of an interpreter.Patient self palpated left breast mass which was followed by a diagnostic bilateral mammogram.  Mammogram showed 3.1 x 2.9 x 1.9 cm hypoechoic mass at the 1 o'clock position of the left breast.  For abnormal cortically thickened left axillary lymph nodes measuring up to 5 mm.  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  Patient was also having ongoing back pain and was seen by [facility] orthopedics Dr. Izola Price who ordered MRI lumbar spine without contrast which showed possible pathologic fractures of L1 and L4 vertebral bodies with greater than 50% height loss at L1 and abnormal signal involving L2-L3 S1 as well as right iliac bone concerning for metastatic disease.  Patient is a single mother of 3 adult children and is very anxious today.  She reports significant back pain which radiates to her bilateral thighs.  Denies any focal tingling numbness or weakness.  Denies any bowel bladder incontinence.  Pain has been uncontrolled despite taking Tylenol.  No prior history of abnormal breast biopsies.  No family history of breast  cancer   PET and MRI showed 3 areas of pathologic fracture of her spine as well as widespread bony metastatic disease and concern for impending fracture of the right hip.  Given her worsening pain she was asked to come to the ER.  She has been evaluated by Dr. Rosita Kea from orthopedic surgery and underwent kyphoplasty at 3 different levels. T6 L1 and L4 along with radiofrequency ablation.    She also underwent prophylactic fixation of the right hip and not affected the sacral region.     Patient received first dose of Herceptin and Perjeta on 09/04/2019.  Patient initially received Taxol and Herceptin perjeta.  Presently patient is on maintenance Herceptin and Perjeta alone.  Patient found to have brain mets in November 2022 for which she received whole brain radiation treatment but there was no evidence of systemic progression    Interval history-reports doing well presently.  Right hip pain is overall stable.  Appetite and weight have remained stable.  Has occasional tingling sensation especially in her left foot towards the end of her Herceptin and Perjeta for which she has been getting morphine with relief of symptoms.  ECOG PS- 1 Pain scale- 0   Review of systems- Review of Systems  Constitutional:  Positive for malaise/fatigue. Negative for chills, fever and weight loss.  HENT:  Negative for congestion, ear discharge and nosebleeds.   Eyes:  Negative for blurred vision.  Respiratory:  Negative for cough, hemoptysis, sputum production, shortness of breath and wheezing.   Cardiovascular:  Negative for chest pain, palpitations, orthopnea and claudication.  Gastrointestinal:  Negative for abdominal pain, blood  in stool, constipation, diarrhea, heartburn, melena, nausea and vomiting.  Genitourinary:  Negative for dysuria, flank pain, frequency, hematuria and urgency.  Musculoskeletal:  Negative for back pain, joint pain and myalgias.  Skin:  Negative for rash.  Neurological:  Positive for sensory  change (Peripheral neuropathy). Negative for dizziness, tingling, focal weakness, seizures, weakness and headaches.  Endo/Heme/Allergies:  Does not bruise/bleed easily.  Psychiatric/Behavioral:  Negative for depression and suicidal ideas. The patient does not have insomnia.       No Known Allergies   Past Medical History:  Diagnosis Date   Anxiety    Breast cancer ([place])    with mets   Cancer ([place])    Colitis    COVID-19 in immunocompromised patient ([place])    Family history of colon cancer    Vertigo      Past Surgical History:  Procedure Laterality Date   BREAST BIOPSY Left 08/14/2019   [place] bx of mass, coil marker, path pending   BREAST BIOPSY Left 08/14/2019   [place] bx of LN, hydromarker, path pending   BREAST BIOPSY Left 08/14/2019   affirm bx of calcs, x marker, path pending   ESOPHAGOGASTRODUODENOSCOPY (EGD) WITH PROPOFOL N/A 10/05/2019   Procedure: ESOPHAGOGASTRODUODENOSCOPY (EGD) WITH PROPOFOL;  Surgeon: Lin Landsman, MD;  Location: [place];  Service: Gastroenterology;  Laterality: N/A;   FLEXIBLE SIGMOIDOSCOPY N/A 10/05/2019   Procedure: FLEXIBLE SIGMOIDOSCOPY;  Surgeon: Lin Landsman, MD;  Location: [facility] ENDOSCOPY;  Service: Gastroenterology;  Laterality: N/A;   INTRAMEDULLARY (IM) NAIL INTERTROCHANTERIC Right 09/01/2019   Procedure: INTRAMEDULLARY (IM) NAIL INTERTROCHANTRIC AND RADIOFREQUENCY ABLATION;  Surgeon: Hessie Knows, MD;  Location: [facility] ORS;  Service: Orthopedics;  Laterality: Right;   KYPHOPLASTY N/A 08/29/2019   Procedure: KYPHOPLASTY T6, L1,L4 ,  RADIOFREQUENCY ABLATION;  Surgeon: Hessie Knows, MD;  Location: [facility] ORS;  Service: Orthopedics;  Laterality: N/A;   KYPHOPLASTY Right 09/01/2019   Procedure: Right Sacral Radiofrequency Ablation and Cement Augmentation, Right sacrum and iliac crest;  Surgeon: Hessie Knows, MD;  Location: [facility] ORS;  Service: Orthopedics;  Laterality: Right;   PORTA CATH INSERTION N/A 08/28/2019   Procedure: PORTA CATH  INSERTION;  Surgeon: Algernon Huxley, MD;  Location: [place] [facility];  Service: Cardiovascular;  Laterality: N/A;    Social History   Socioeconomic History   Marital status: Single    Spouse name: Not on file   Number of children: Not on file   Years of education: Not on file   Highest education level: Not on file  Occupational History   Not on file  Tobacco Use   Smoking status: Never   Smokeless tobacco: Never  Vaping Use   Vaping Use: Never used  Substance and Sexual Activity   Alcohol use: Not Currently   Drug use: Not Currently   Sexual activity: Not Currently    Birth control/protection: None  Other Topics Concern   Not on file  Social History Narrative   Lives at home with children   Social Determinants of Health   Financial Resource Strain: Not on file  Food Insecurity: Not on file  Transportation Needs: No Transportation Needs   Lack of Transportation (Medical): No   Lack of Transportation (Non-Medical): No  Physical Activity: Not on file  Stress: Not on file  Social Connections: Not on file  Intimate Partner Violence: Not on file    Family History  Problem Relation Age of Onset   Colon cancer Maternal Uncle      Current Outpatient Medications:  benzoyl peroxide-erythromycin (BENZAMYCIN) gel, Apply topically 2 (two) times daily., Disp: 46.6 g, Rfl: 0   Calcium Carb-Cholecalciferol 600-10 MG-MCG TABS, Take 1 tablet by mouth 2 (two) times daily., Disp: 180 tablet, Rfl: 0   DULoxetine (CYMBALTA) 60 MG capsule, Take 1 capsule (60 mg total) by mouth daily., Disp: 30 capsule, Rfl: 3   gabapentin (NEURONTIN) 600 MG tablet, Take 1 tablet (600 mg total) by mouth 3 (three) times daily., Disp: 90 tablet, Rfl: 3   morphine (MS CONTIN) 15 MG 12 hr tablet, Take 1 tablet (15 mg total) by mouth every 12 (twelve) hours., Disp: 60 tablet, Rfl: 0   oxyCODONE (OXY IR/ROXICODONE) 5 MG immediate release tablet, Take 1-2 tablets (5-10 mg total) by mouth every 4 (four) hours  as needed., Disp: 90 tablet, Rfl: 0   albuterol (VENTOLIN HFA) 108 (90 Base) MCG/ACT inhaler, Inhale 2 puffs into the lungs every 6 (six) hours as needed for wheezing or shortness of breath. (Patient not taking: Reported on 08/05/2021), Disp: 8.5 g, Rfl: 2   diphenoxylate-atropine (LOMOTIL) 2.5-0.025 MG tablet, Take 1 tablet by mouth 4 (four) times daily as needed for diarrhea or loose stools. (Patient not taking: Reported on 11/19/2020), Disp: 40 tablet, Rfl: 0   lidocaine-prilocaine (EMLA) cream, Apply 1 application topically as needed. Apply small amount to port site at least 1 hour prior to it being accessed, cover with plastic wrap, Disp: 30 g, Rfl: 3   LORazepam (ATIVAN) 0.5 MG tablet, Take 1 tablet (0.5 mg total) by mouth every 6 (six) hours as needed for anxiety. AS NEEDED FOR NAUSEA (Patient not taking: Reported on 08/05/2021), Disp: 45 tablet, Rfl: 0   OLANZapine (ZYPREXA) 10 MG tablet, Take 1 tablet (10 mg total) by mouth at bedtime as needed (nausea). (Patient not taking: Reported on 06/24/2021), Disp: 30 tablet, Rfl: 2   ondansetron (ZOFRAN) 8 MG tablet, Take 1 tablet (8 mg total) by mouth every 8 (eight) hours as needed for nausea or vomiting. (Patient not taking: Reported on 06/24/2021), Disp: 45 tablet, Rfl: 2   polyethylene glycol powder (MIRALAX) 17 GM/SCOOP powder, Take 17 g by mouth daily as needed. (Patient not taking: Reported on 06/24/2021), Disp: 238 g, Rfl: 3   sulfamethoxazole-trimethoprim (BACTRIM DS) 800-160 MG tablet, Take 1 tablet by mouth 2 (two) times daily., Disp: 14 tablet, Rfl: 0 No current facility-administered medications for this visit.  Facility-Administered Medications Ordered in Other Visits:    0.9 %  sodium chloride infusion, , Intravenous, Continuous, Burns, Wandra Feinstein, NP, Stopped at 06/02/20 1453   heparin lock flush 100 unit/mL, 500 Units, Intravenous, Once, Sindy Guadeloupe, MD   heparin lock flush 100 unit/mL, 500 Units, Intravenous, Once, Sindy Guadeloupe, MD    sodium chloride flush (NS) 0.9 % injection 10 mL, 10 mL, Intravenous, Once, Sindy Guadeloupe, MD   sodium chloride flush (NS) 0.9 % injection 10 mL, 10 mL, Intravenous, Once, Sindy Guadeloupe, MD  Physical exam:  Vitals:   08/05/21 0933  BP: 112/80  Pulse: 79  Resp: 18  Temp: (!) 97.5 F (36.4 C)  SpO2: 98%  Weight: 146 lb 11.2 oz (66.5 kg)   Physical Exam Constitutional:      General: She is not in acute distress. Cardiovascular:     Rate and Rhythm: Normal rate and regular rhythm.     Heart sounds: Normal heart sounds.  Pulmonary:     Effort: Pulmonary effort is normal.     Breath sounds: Normal breath sounds.  Abdominal:     General: Bowel sounds are normal.     Palpations: Abdomen is soft.  Skin:    General: Skin is warm and dry.  Neurological:     Mental Status: She is alert and oriented to person, place, and time.        Latest Ref Rng & Units 08/05/2021    9:07 AM  CMP  Glucose 70 - 99 mg/dL 96    BUN 6 - 20 mg/dL 10    Creatinine 0.44 - 1.00 mg/dL 0.43    Sodium 135 - 145 mmol/L 136    Potassium 3.5 - 5.1 mmol/L 3.8    Chloride 98 - 111 mmol/L 102    CO2 22 - 32 mmol/L 26    Calcium 8.9 - 10.3 mg/dL 8.7    Total Protein 6.5 - 8.1 g/dL 7.5    Total Bilirubin 0.3 - 1.2 mg/dL 0.6    Alkaline Phos 38 - 126 U/L 69    AST 15 - 41 U/L 23    ALT 0 - 44 U/L 18        Latest Ref Rng & Units 08/05/2021    9:07 AM  CBC  WBC 4.0 - 10.5 K/uL 4.6    Hemoglobin 12.0 - 15.0 g/dL 13.1    Hematocrit 36.0 - 46.0 % 39.6    Platelets 150 - 400 K/uL 323      No images are attached to the encounter.  NM Bone Scan Whole Body  Result Date: 07/31/2021 CLINICAL DATA:  Metastatic left breast cancer EXAM: NUCLEAR MEDICINE WHOLE BODY BONE SCAN TECHNIQUE: Whole body anterior and posterior images were obtained approximately 3 hours after intravenous injection of radiopharmaceutical. RADIOPHARMACEUTICALS:  20.13 mCi Technetium-65m MDP IV COMPARISON:  CT chest abdomen pelvis  07/28/2021, bone scan 05/09/2021 FINDINGS: Anterior and posterior whole body planar images as well as spot images of the pelvis, thoracic cage, and calvarium are obtained. There is physiologic excretion of radiotracer within the kidneys and bladder. Persistent increased radiotracer uptake within the proximal right femur, which may reflect a combination of prior healed trauma and postsurgical change. Underlying metastatic disease would be difficult to exclude, but the appearance is stable since prior study. Activity within the left anterior third rib consistent with prior healed fracture, stable. Stable increased radiotracer uptake is seen within the left ilium consistent with metastatic disease. Stable activity within the midthoracic spine, L1, and L4 regions consistent with vertebral augmentation. Activity at L2 consistent with known bony metastases. Activity within the right posterior second rib seen previously slightly less pronounced on this exam, consistent with known focus of metastatic disease. IMPRESSION: 1. Stable bone metastases within the left ilium and right posterior second rib. 2. Continued activity within the midthoracic spine and lumbar spine as above, consistent with a combination of metastatic disease and post therapeutic change. 3. Increased activity within the right femur, consistent with a combination of posttraumatic and postsurgical change. Underlying metastatic disease would be difficult to exclude. 4. No new bony metastases. Electronically Signed   By: Randa Ngo M.D.   On: 07/31/2021 15:27   CT CHEST ABDOMEN PELVIS W CONTRAST  Result Date: 07/31/2021 CLINICAL DATA:  Metastatic breast cancer, assess treatment response * Tracking Code: BO * EXAM: CT CHEST, ABDOMEN, AND PELVIS WITH CONTRAST TECHNIQUE: Multidetector CT imaging of the chest, abdomen and pelvis was performed following the standard protocol during bolus administration of intravenous contrast. RADIATION DOSE REDUCTION: This  exam was performed according to the departmental dose-optimization  program which includes automated exposure control, adjustment of the mA and/or kV according to patient size and/or use of iterative reconstruction technique. CONTRAST:  163mL OMNIPAQUE IOHEXOL 300 MG/ML SOLN, additional oral enteric contrast COMPARISON:  05/10/2021 FINDINGS: CT CHEST FINDINGS Cardiovascular: Right chest port catheter. Normal heart size. No pericardial effusion. Mediastinum/Nodes: No enlarged mediastinal, hilar, or axillary lymph nodes. Thyroid gland, trachea, and esophagus demonstrate no significant findings. Lungs/Pleura: Lungs are clear. No pleural effusion or pneumothorax. Musculoskeletal: No chest wall mass. CT ABDOMEN PELVIS FINDINGS Hepatobiliary: No solid liver abnormality is seen. No gallstones, gallbladder wall thickening, or biliary dilatation. Pancreas: Unremarkable. No pancreatic ductal dilatation or surrounding inflammatory changes. Spleen: Normal in size without significant abnormality. Adrenals/Urinary Tract: Adrenal glands are unremarkable. Kidneys are normal, without renal calculi, solid lesion, or hydronephrosis. Bladder is unremarkable. Stomach/Bowel: Stomach is within normal limits. Appendix appears normal. No evidence of bowel wall thickening, distention, or inflammatory changes. Vascular/Lymphatic: No significant vascular findings are present. No enlarged abdominal or pelvic lymph nodes. Reproductive: No mass or other abnormality. Other: No abdominal wall hernia or abnormality. No ascites. Musculoskeletal: No acute osseous findings. Multiple small sclerotic osseous lesions throughout the included axial skeleton are unchanged, for example of the manubrium (series 7, image 121), and the T8, T10, and L2 vertebral bodies (series 7, image 112). Wedge deformities of T6, L1, and L4 status post status post cement augmentation, as well as cement augmentation of the right sacral ala and ilium (series 2, image 77). Status  intramedullary nail fixation of the right hip. IMPRESSION: 1. Unchanged sclerotic osseous metastatic disease. Status post cement augmentation of T6, L1, and L4, as well as of the right sacral ala and right ilium. 2. No evidence of lymphadenopathy or soft tissue metastatic disease in the chest, abdomen, or pelvis. Electronically Signed   By: Delanna Ahmadi M.D.   On: 07/31/2021 13:47   ECHOCARDIOGRAM COMPLETE  Result Date: 08/01/2021    ECHOCARDIOGRAM REPORT   Patient Name:   KIYLEE THORESON [facility] Date of Exam: 08/01/2021 Medical Rec #:  [id number]             Height:       62.0 in Accession #:    [id number]            Weight:       146.4 lb Date of Birth:  [birth year]             BSA:          1.674 m Patient Age:    34 years              BP:           117/68 mmHg Patient Gender: F                     HR:           82 bpm. Exam Location:  [facility] Procedure: 2D Echo, Cardiac Doppler, Color Doppler and Strain Analysis Indications:     C 50.912, Breast cancer  History:         Patient has prior history of Echocardiogram examinations, most                  recent 04/13/2021. Anxiety, breast cancer, Covid-19.  Sonographer:     Sherrie Sport Referring Phys:  [id number] [facility] C Kashauna Celmer Diagnosing Phys: Yolonda Kida MD  Sonographer Comments: Suboptimal apical window and no subcostal window. Global longitudinal strain was attempted. IMPRESSIONS  1. Left  ventricular ejection fraction, by estimation, is 55 to 60%. The left ventricle has normal function. The left ventricle has no regional wall motion abnormalities. Left ventricular diastolic parameters were normal.  2. Right ventricular systolic function is normal. The right ventricular size is normal.  3. The mitral valve is normal in structure. No evidence of mitral valve regurgitation.  4. The aortic valve is normal in structure. Aortic valve regurgitation is not visualized. FINDINGS  Left Ventricle: Left ventricular ejection fraction, by estimation, is 55 to 60%. The left  ventricle has normal function. The left ventricle has no regional wall motion abnormalities. The left ventricular internal cavity size was normal in size. There is  no left ventricular hypertrophy. Left ventricular diastolic parameters were normal. Right Ventricle: The right ventricular size is normal. No increase in right ventricular wall thickness. Right ventricular systolic function is normal. Left Atrium: Left atrial size was normal in size. Right Atrium: Right atrial size was normal in size. Pericardium: There is no evidence of pericardial effusion. Mitral Valve: The mitral valve is normal in structure. No evidence of mitral valve regurgitation. MV peak gradient, 1.9 mmHg. The mean mitral valve gradient is 1.0 mmHg. Tricuspid Valve: The tricuspid valve is normal in structure. Tricuspid valve regurgitation is mild. Aortic Valve: The aortic valve is normal in structure. Aortic valve regurgitation is not visualized. Aortic valve mean gradient measures 2.0 mmHg. Aortic valve peak gradient measures 3.2 mmHg. Aortic valve area, by VTI measures 2.61 cm. Pulmonic Valve: The pulmonic valve was normal in structure. Pulmonic valve regurgitation is not visualized. Aorta: The ascending aorta was not well visualized. IAS/Shunts: No atrial level shunt detected by color flow Doppler.  LEFT VENTRICLE PLAX 2D LVIDd:         3.10 cm   Diastology LVIDs:         2.20 cm   LV e' medial:    6.31 cm/s LV PW:         1.00 cm   LV E/e' medial:  8.5 LV IVS:        0.95 cm   LV e' lateral:   9.36 cm/s LVOT diam:     2.00 cm   LV E/e' lateral: 5.7 LV SV:         35 LV SV Index:   21 LVOT Area:     3.14 cm                           3D Volume EF:                          3D EF:        58 %                          LV EDV:       404 ml                          LV ESV:       171 ml                          LV SV:        233 ml RIGHT VENTRICLE RV Basal diam:  2.80 cm RV S prime:     11.20 cm/s TAPSE (M-mode): 2.4 cm LEFT ATRIUM  Index        RIGHT ATRIUM          Index LA diam:        3.30 cm 1.97 cm/m   RA Area:     7.55 cm LA Vol (A2C):   32.9 ml 19.65 ml/m  RA Volume:   11.70 ml 6.99 ml/m LA Vol (A4C):   17.7 ml 10.57 ml/m LA Biplane Vol: 25.1 ml 14.99 ml/m  AORTIC VALVE                    PULMONIC VALVE AV Area (Vmax):    2.10 cm     PV Vmax:        0.66 m/s AV Area (Vmean):   2.11 cm     PV Vmean:       43.100 cm/s AV Area (VTI):     2.61 cm     PV VTI:         0.122 m AV Vmax:           88.90 cm/s   PV Peak grad:   1.7 mmHg AV Vmean:          62.000 cm/s  PV Mean grad:   1.0 mmHg AV VTI:            0.135 m      RVOT Peak grad: 3 mmHg AV Peak Grad:      3.2 mmHg AV Mean Grad:      2.0 mmHg LVOT Vmax:         59.40 cm/s LVOT Vmean:        41.700 cm/s LVOT VTI:          0.112 m LVOT/AV VTI ratio: 0.83  AORTA Ao Root diam: 2.67 cm MITRAL VALVE               TRICUSPID VALVE MV Area (PHT): 4.21 cm    TR Peak grad:   21.0 mmHg MV Area VTI:   2.14 cm    TR Vmax:        229.00 cm/s MV Peak grad:  1.9 mmHg MV Mean grad:  1.0 mmHg    SHUNTS MV Vmax:       0.69 m/s    Systemic VTI:  0.11 m MV Vmean:      45.3 cm/s   Systemic Diam: 2.00 cm MV Decel Time: 180 msec    Pulmonic VTI:  0.148 m MV E velocity: 53.60 cm/s MV A velocity: 46.30 cm/s MV E/A ratio:  1.16 Dwayne D Callwood MD Electronically signed by Yolonda Kida MD Signature Date/Time: 08/01/2021/5:27:03 PM    Final      Assessment and plan- Patient is a 41 y.o. female with ER negative HER2 positive metastatic breast cancer with bone metastases.  She is here for on treatment assessment prior to cycle 32 of Herceptin and Perjeta  Counts okay to proceed with cycle 32 of Herceptin and Perjeta today.  She will directly proceed for cycle 33 in 3 weeks and I will see her back in 6 weeks for cycle 34.  Her recent echocardiogram was normal.  I have reviewed CT chest abdomen pelvis images as well as bone scan images independently and discussed findings with the patient which  overall shows stable disease.  There are no new bone metastasis noted on bone scanAnd there is continued activity in her mid thoracic and lumbar spine as well as activity in the right femur left IEM and  right posterior second rib.  My plan is to continue Herceptin and Perjeta since there is no overt progression noted on her systemic scans.  I will also obtain a repeat MRI brain with and without contrast before her next visit with me.  Bone metastases: Patient will receive her next dose of Xgeva in 6 weeks   Visit Diagnosis 1. Burning with urination   2. Primary malignant neoplasm of breast with metastasis ([place])   3. Encounter for monoclonal antibody treatment for malignancy      Dr. Randa Evens, MD, MPH [facility] [phone] 08/05/2021 3:36 PM

## 2021-08-05 NOTE — Progress Notes (Signed)
Pt states that she has been experiencing a burning sensation when urinating sxs started on Wednesday;

## 2021-08-05 NOTE — Patient Instructions (Signed)
[facility] [place]  Discharge Instructions: °Thank you for choosing [facility] to provide your oncology and hematology care.  °If you have a lab appointment with the Cancer Center, please go directly to the Cancer Center and check in at the registration area. ° °Wear comfortable clothing and clothing appropriate for easy access to any Portacath or PICC line.  ° °We strive to give you quality time with your provider. You may need to reschedule your appointment if you arrive late (15 or more minutes).  Arriving late affects you and other patients whose appointments are after yours.  Also, if you miss three or more appointments without notifying the office, you may be dismissed from the clinic at the provider’s discretion.    °  °For prescription refill requests, have your pharmacy contact our office and allow 72 hours for refills to be completed.   ° °Today you received the following chemotherapy and/or immunotherapy agents     °  °To help prevent nausea and vomiting after your treatment, we encourage you to take your nausea medication as directed. ° °BELOW ARE SYMPTOMS THAT SHOULD BE REPORTED IMMEDIATELY: °*FEVER GREATER THAN 100.4 F (38 °C) OR HIGHER °*CHILLS OR SWEATING °*NAUSEA AND VOMITING THAT IS NOT CONTROLLED WITH YOUR NAUSEA MEDICATION °*UNUSUAL SHORTNESS OF BREATH °*UNUSUAL BRUISING OR BLEEDING °*URINARY PROBLEMS (pain or burning when urinating, or frequent urination) °*BOWEL PROBLEMS (unusual diarrhea, constipation, pain near the anus) °TENDERNESS IN MOUTH AND THROAT WITH OR WITHOUT PRESENCE OF ULCERS (sore throat, sores in mouth, or a toothache) °UNUSUAL RASH, SWELLING OR PAIN  °UNUSUAL VAGINAL DISCHARGE OR ITCHING  ° °Items with * indicate a potential emergency and should be followed up as soon as possible or go to the Emergency Department if any problems should occur. ° °Please show the CHEMOTHERAPY ALERT CARD or IMMUNOTHERAPY ALERT CARD at check-in to the  Emergency Department and triage nurse. ° °Should you have questions after your visit or need to cancel or reschedule your appointment, please contact [facility] [place]  [phone] and follow the prompts.  Office hours are 8:00 a.m. to 4:30 p.m. Monday - Friday. Please note that voicemails left after 4:00 p.m. may not be returned until the following business day.  We are closed weekends and major holidays. You have access to a nurse at all times for urgent questions. Please call the main number to the clinic [phone] and follow the prompts. ° °For any non-urgent questions, you may also contact your provider using MyChart. We now offer e-Visits for anyone 18 and older to request care online for non-urgent symptoms. For details visit [email or web address]. °  °Also download the MyChart app! Go to the app store, search "MyChart", open the app, select [facility], and log in with your MyChart username and password. ° °Due to Covid, a mask is required upon entering the hospital/clinic. If you do not have a mask, one will be given to you upon arrival. For doctor visits, patients may have 1 support person aged 18 or older with them. For treatment visits, patients cannot have anyone with them due to current Covid guidelines and our immunocompromised population.  °

## 2021-08-07 LAB — URINE CULTURE: Culture: 70000 — AB

## 2021-08-11 ENCOUNTER — Other Ambulatory Visit: Payer: Self-pay | Admitting: *Deleted

## 2021-08-11 ENCOUNTER — Encounter: Payer: Self-pay | Admitting: Oncology

## 2021-08-11 ENCOUNTER — Other Ambulatory Visit: Payer: Self-pay

## 2021-08-11 MED ORDER — MORPHINE SULFATE ER 15 MG PO TBCR
15.0000 mg | EXTENDED_RELEASE_TABLET | Freq: Two times a day (BID) | ORAL | 0 refills | Status: DC
  Filled 2021-08-11: qty 20, 10d supply, fill #0
  Filled 2021-08-11: qty 40, 20d supply, fill #0

## 2021-08-11 MED ORDER — OXYCODONE HCL 10 MG PO TABS
5.0000 mg | ORAL_TABLET | ORAL | 0 refills | Status: DC | PRN
  Filled 2021-08-11: qty 45, 8d supply, fill #0

## 2021-08-12 ENCOUNTER — Other Ambulatory Visit: Payer: Self-pay

## 2021-08-13 ENCOUNTER — Inpatient Hospital Stay: Admission: RE | Admit: 2021-08-13 | Payer: Self-pay | Source: Ambulatory Visit

## 2021-08-26 ENCOUNTER — Inpatient Hospital Stay: Payer: Self-pay

## 2021-08-26 ENCOUNTER — Ambulatory Visit: Payer: Self-pay

## 2021-08-26 ENCOUNTER — Inpatient Hospital Stay: Payer: Self-pay | Attending: Oncology

## 2021-08-26 VITALS — BP 112/77 | HR 85 | Temp 97.2°F | Resp 18

## 2021-08-26 DIAGNOSIS — Z5111 Encounter for antineoplastic chemotherapy: Secondary | ICD-10-CM | POA: Insufficient documentation

## 2021-08-26 DIAGNOSIS — C7931 Secondary malignant neoplasm of brain: Secondary | ICD-10-CM | POA: Insufficient documentation

## 2021-08-26 DIAGNOSIS — Z5112 Encounter for antineoplastic immunotherapy: Secondary | ICD-10-CM | POA: Insufficient documentation

## 2021-08-26 DIAGNOSIS — G8929 Other chronic pain: Secondary | ICD-10-CM | POA: Insufficient documentation

## 2021-08-26 DIAGNOSIS — R1013 Epigastric pain: Secondary | ICD-10-CM | POA: Insufficient documentation

## 2021-08-26 DIAGNOSIS — C50919 Malignant neoplasm of unspecified site of unspecified female breast: Secondary | ICD-10-CM

## 2021-08-26 DIAGNOSIS — Z800 Family history of malignant neoplasm of digestive organs: Secondary | ICD-10-CM | POA: Insufficient documentation

## 2021-08-26 DIAGNOSIS — Z171 Estrogen receptor negative status [ER-]: Secondary | ICD-10-CM | POA: Insufficient documentation

## 2021-08-26 DIAGNOSIS — C7951 Secondary malignant neoplasm of bone: Secondary | ICD-10-CM | POA: Insufficient documentation

## 2021-08-26 DIAGNOSIS — Z8616 Personal history of COVID-19: Secondary | ICD-10-CM | POA: Insufficient documentation

## 2021-08-26 DIAGNOSIS — C50412 Malignant neoplasm of upper-outer quadrant of left female breast: Secondary | ICD-10-CM | POA: Insufficient documentation

## 2021-08-26 DIAGNOSIS — M8440XA Pathological fracture, unspecified site, initial encounter for fracture: Secondary | ICD-10-CM

## 2021-08-26 DIAGNOSIS — R110 Nausea: Secondary | ICD-10-CM | POA: Insufficient documentation

## 2021-08-26 DIAGNOSIS — M549 Dorsalgia, unspecified: Secondary | ICD-10-CM | POA: Insufficient documentation

## 2021-08-26 LAB — CBC WITH DIFFERENTIAL/PLATELET
Abs Immature Granulocytes: 0.01 10*3/uL (ref 0.00–0.07)
Basophils Absolute: 0 10*3/uL (ref 0.0–0.1)
Basophils Relative: 1 %
Eosinophils Absolute: 0.1 10*3/uL (ref 0.0–0.5)
Eosinophils Relative: 3 %
HCT: 39.6 % (ref 36.0–46.0)
Hemoglobin: 13.3 g/dL (ref 12.0–15.0)
Immature Granulocytes: 0 %
Lymphocytes Relative: 24 %
Lymphs Abs: 1.3 10*3/uL (ref 0.7–4.0)
MCH: 28.7 pg (ref 26.0–34.0)
MCHC: 33.6 g/dL (ref 30.0–36.0)
MCV: 85.3 fL (ref 80.0–100.0)
Monocytes Absolute: 0.3 10*3/uL (ref 0.1–1.0)
Monocytes Relative: 6 %
Neutro Abs: 3.5 10*3/uL (ref 1.7–7.7)
Neutrophils Relative %: 66 %
Platelets: 317 10*3/uL (ref 150–400)
RBC: 4.64 MIL/uL (ref 3.87–5.11)
RDW: 13.7 % (ref 11.5–15.5)
WBC: 5.2 10*3/uL (ref 4.0–10.5)
nRBC: 0 % (ref 0.0–0.2)

## 2021-08-26 LAB — COMPREHENSIVE METABOLIC PANEL
ALT: 22 U/L (ref 0–44)
AST: 35 U/L (ref 15–41)
Albumin: 3.8 g/dL (ref 3.5–5.0)
Alkaline Phosphatase: 73 U/L (ref 38–126)
Anion gap: 8 (ref 5–15)
BUN: 10 mg/dL (ref 6–20)
CO2: 26 mmol/L (ref 22–32)
Calcium: 8.9 mg/dL (ref 8.9–10.3)
Chloride: 104 mmol/L (ref 98–111)
Creatinine, Ser: 0.54 mg/dL (ref 0.44–1.00)
GFR, Estimated: >60 mL/min (ref >60)
Glucose, Bld: 156 mg/dL — ABNORMAL HIGH (ref 70–99)
Potassium: 3.5 mmol/L (ref 3.5–5.1)
Sodium: 138 mmol/L (ref 135–145)
Total Bilirubin: 0.6 mg/dL (ref 0.3–1.2)
Total Protein: 7.6 g/dL (ref 6.5–8.1)

## 2021-08-26 MED ORDER — ACETAMINOPHEN 325 MG PO TABS
650.0000 mg | ORAL_TABLET | Freq: Once | ORAL | Status: AC
  Administered 2021-08-26: 650 mg via ORAL
  Filled 2021-08-26: qty 2

## 2021-08-26 MED ORDER — DIPHENHYDRAMINE HCL 50 MG/ML IJ SOLN
50.0000 mg | Freq: Once | INTRAMUSCULAR | Status: AC
  Administered 2021-08-26: 50 mg via INTRAVENOUS
  Filled 2021-08-26: qty 1

## 2021-08-26 MED ORDER — SODIUM CHLORIDE 0.9 % IV SOLN
Freq: Once | INTRAVENOUS | Status: AC
  Filled 2021-08-26: qty 250

## 2021-08-26 MED ORDER — MORPHINE SULFATE (PF) 2 MG/ML IV SOLN
2.0000 mg | Freq: Once | INTRAVENOUS | Status: AC
  Administered 2021-08-26: 2 mg via INTRAVENOUS
  Filled 2021-08-26: qty 1

## 2021-08-26 MED ORDER — DENOSUMAB 120 MG/1.7ML ~~LOC~~ SOLN
120.0000 mg | Freq: Once | SUBCUTANEOUS | Status: AC
  Administered 2021-08-26: 120 mg via SUBCUTANEOUS
  Filled 2021-08-26: qty 1.7

## 2021-08-26 MED ORDER — SODIUM CHLORIDE 0.9 % IV SOLN
420.0000 mg | Freq: Once | INTRAVENOUS | Status: AC
  Administered 2021-08-26: 420 mg via INTRAVENOUS
  Filled 2021-08-26: qty 14

## 2021-08-26 MED ORDER — HEPARIN SOD (PORK) LOCK FLUSH 100 UNIT/ML IV SOLN
500.0000 [IU] | Freq: Once | INTRAVENOUS | Status: AC | PRN
  Administered 2021-08-26: 500 [IU]
  Filled 2021-08-26: qty 5

## 2021-08-26 MED ORDER — TRASTUZUMAB-ANNS CHEMO 150 MG IV SOLR
6.0000 mg/kg | Freq: Once | INTRAVENOUS | Status: AC
  Administered 2021-08-26: 357 mg via INTRAVENOUS
  Filled 2021-08-26: qty 17

## 2021-08-26 NOTE — Patient Instructions (Signed)
[facility] [place]  Discharge Instructions: Thank you for choosing [facility] to provide your oncology and hematology care.  If you have a lab appointment with the [facility], please go directly to the [facility] and check in at the registration area.  Wear comfortable clothing and clothing appropriate for easy access to any Portacath or PICC line.   We strive to give you quality time with your provider. You may need to reschedule your appointment if you arrive late (15 or more minutes).  Arriving late affects you and other patients whose appointments are after yours.  Also, if you miss three or more appointments without notifying the office, you may be dismissed from the clinic at the provider's discretion.      For prescription refill requests, have your pharmacy contact our office and allow 72 hours for refills to be completed.    Today you received the following chemotherapy and/or immunotherapy agents Kajinti & Perjeta      To help prevent nausea and vomiting after your treatment, we encourage you to take your nausea medication as directed.  BELOW ARE SYMPTOMS THAT SHOULD BE REPORTED IMMEDIATELY: *FEVER GREATER THAN 100.4 F (38 C) OR HIGHER *CHILLS OR SWEATING *NAUSEA AND VOMITING THAT IS NOT CONTROLLED WITH YOUR NAUSEA MEDICATION *UNUSUAL SHORTNESS OF BREATH *UNUSUAL BRUISING OR BLEEDING *URINARY PROBLEMS (pain or burning when urinating, or frequent urination) *BOWEL PROBLEMS (unusual diarrhea, constipation, pain near the anus) TENDERNESS IN MOUTH AND THROAT WITH OR WITHOUT PRESENCE OF ULCERS (sore throat, sores in mouth, or a toothache) UNUSUAL RASH, SWELLING OR PAIN  UNUSUAL VAGINAL DISCHARGE OR ITCHING   Items with * indicate a potential emergency and should be followed up as soon as possible or go to the Emergency Department if any problems should occur.  Please show the CHEMOTHERAPY ALERT CARD or IMMUNOTHERAPY ALERT CARD at  check-in to the Emergency Department and triage nurse.  Should you have questions after your visit or need to cancel or reschedule your appointment, please contact [facility] [place] [facility] [place]  [phone] and follow the prompts.  Office hours are 8:00 a.m. to 4:30 p.m. Monday - Friday. Please note that voicemails left after 4:00 p.m. may not be returned until the following business day.  We are closed weekends and major holidays. You have access to a nurse at all times for urgent questions. Please call the main number to the clinic [phone] and follow the prompts.  For any non-urgent questions, you may also contact your provider using MyChart. We now offer e-Visits for anyone 44 and older to request care online for non-urgent symptoms. For details visit [email or web address].   Also download the MyChart app! Go to the app store, search "MyChart", open the app, select [facility], and log in with your MyChart username and password.  Due to Covid, a mask is required upon entering the hospital/clinic. If you do not have a mask, one will be given to you upon arrival. For doctor visits, patients may have 1 support person aged 63 or older with them. For treatment visits, patients cannot have anyone with them due to current Covid guidelines and our immunocompromised population.

## 2021-09-01 ENCOUNTER — Encounter: Payer: Self-pay | Admitting: Nurse Practitioner

## 2021-09-01 ENCOUNTER — Other Ambulatory Visit: Payer: Self-pay

## 2021-09-01 ENCOUNTER — Inpatient Hospital Stay: Payer: Self-pay

## 2021-09-01 ENCOUNTER — Inpatient Hospital Stay (HOSPITAL_BASED_OUTPATIENT_CLINIC_OR_DEPARTMENT_OTHER): Payer: Self-pay | Admitting: Nurse Practitioner

## 2021-09-01 VITALS — HR 73 | Temp 96.2°F | Resp 17 | Wt 144.0 lb

## 2021-09-01 DIAGNOSIS — C50919 Malignant neoplasm of unspecified site of unspecified female breast: Secondary | ICD-10-CM

## 2021-09-01 DIAGNOSIS — R519 Headache, unspecified: Secondary | ICD-10-CM

## 2021-09-01 LAB — COMPREHENSIVE METABOLIC PANEL
ALT: 26 U/L (ref 0–44)
AST: 31 U/L (ref 15–41)
Albumin: 4 g/dL (ref 3.5–5.0)
Alkaline Phosphatase: 82 U/L (ref 38–126)
Anion gap: 7 (ref 5–15)
BUN: 13 mg/dL (ref 6–20)
CO2: 24 mmol/L (ref 22–32)
Calcium: 8.6 mg/dL — ABNORMAL LOW (ref 8.9–10.3)
Chloride: 105 mmol/L (ref 98–111)
Creatinine, Ser: 0.53 mg/dL (ref 0.44–1.00)
GFR, Estimated: >60 mL/min (ref >60)
Glucose, Bld: 97 mg/dL (ref 70–99)
Potassium: 3.9 mmol/L (ref 3.5–5.1)
Sodium: 136 mmol/L (ref 135–145)
Total Bilirubin: 0.9 mg/dL (ref 0.3–1.2)
Total Protein: 8 g/dL (ref 6.5–8.1)

## 2021-09-01 LAB — CBC WITH DIFFERENTIAL/PLATELET
Abs Immature Granulocytes: 0.01 10*3/uL (ref 0.00–0.07)
Basophils Absolute: 0 10*3/uL (ref 0.0–0.1)
Basophils Relative: 1 %
Eosinophils Absolute: 0.2 10*3/uL (ref 0.0–0.5)
Eosinophils Relative: 4 %
HCT: 43.4 % (ref 36.0–46.0)
Hemoglobin: 14.4 g/dL (ref 12.0–15.0)
Immature Granulocytes: 0 %
Lymphocytes Relative: 29 %
Lymphs Abs: 1.2 10*3/uL (ref 0.7–4.0)
MCH: 28.2 pg (ref 26.0–34.0)
MCHC: 33.2 g/dL (ref 30.0–36.0)
MCV: 85.1 fL (ref 80.0–100.0)
Monocytes Absolute: 0.3 10*3/uL (ref 0.1–1.0)
Monocytes Relative: 8 %
Neutro Abs: 2.4 10*3/uL (ref 1.7–7.7)
Neutrophils Relative %: 58 %
Platelets: 320 10*3/uL (ref 150–400)
RBC: 5.1 MIL/uL (ref 3.87–5.11)
RDW: 13.5 % (ref 11.5–15.5)
WBC: 4.1 10*3/uL (ref 4.0–10.5)
nRBC: 0 % (ref 0.0–0.2)

## 2021-09-01 NOTE — Progress Notes (Signed)
Patient here for oncology follow-up appointment, concerns of headaches of severe headaches and uncontrolled nausea

## 2021-09-01 NOTE — Progress Notes (Signed)
Symptom Management [place] [facility] [place]. [facility] [address] [place], [place] [address] [phone] (phone) [phone] (fax)  Patient Care Team: Sindy Guadeloupe, MD as PCP - General (Oncology) Rico Junker, RN as Registered Nurse Theodore Demark, RN (Inactive) as Registered Nurse Sindy Guadeloupe, MD as Consulting Physician (Hematology and Oncology)   Name of the patient: Sophia [name]  [id number]  [birth year]   Date of visit: 09/01/21  Diagnosis-metastatic breast cancer  Chief complaint/ Reason for visit-headaches and nausea  Heme/Onc history:  Oncology History  Primary cancer of left breast with metastasis to other site [facility])  08/28/2019 Initial Diagnosis   Primary cancer of left breast with metastasis to other site [facility])   01/21/2021 Cancer Staging   Staging form: Breast, AJCC 8th Edition - Clinical stage from 01/21/2021: Stage IV (cT2, cN1, cM1, G2, ER-, PR-, HER2+) - Signed by Sindy Guadeloupe, MD on 01/21/2021 Histologic grading system: 3 grade system   Primary malignant neoplasm of breast with metastasis ([place])  08/29/2019 Initial Diagnosis   Metastatic breast cancer ([place])   09/04/2019 -  Chemotherapy   Patient is on Treatment Plan : BREAST Weekly Paclitaxel + Trastuzumab + Pertuzumab q21d      Genetic Testing   Negative genetic testing. No pathogenic variants identified on the [facility] CancerNext-Expanded+RNA panel. The report date is 04/13/2020.   The CancerNext-Expanded + RNAinsight gene panel offered by [employer] and includes sequencing and rearrangement analysis for the following 77 genes: IP, ALK, APC*, ATM*, AXIN2, BAP1, BARD1, BLM, BMPR1A, BRCA1*, BRCA2*, BRIP1*, CDC73, CDH1*,CDK4, CDKN1B, CDKN2A, CHEK2*, CTNNA1, DICER1, FANCC, FH, FLCN, GALNT12, KIF1B, LZTR1, MAX, MEN1, MET, MLH1*, MSH2*, MSH3, MSH6*, MUTYH*, NBN, NF1*, NF2, NTHL1, PALB2*, PHOX2B, PMS2*,  POT1, PRKAR1A, PTCH1, PTEN*, RAD51C*, RAD51D*,RB1, RECQL, RET, SDHA, SDHAF2, SDHB, SDHC, SDHD, SMAD4, SMARCA4, SMARCB1, SMARCE1, STK11, SUFU, TMEM127, TP53*,TSC1, TSC2, VHL and XRCC2 (sequencing and deletion/duplication); EGFR, EGLN1, HOXB13, KIT, MITF, PDGFRA, POLD1 and POLE (sequencing only); EPCAM and GREM1 (deletion/duplication only). DNA and RNA analyses performed for * genes.     Interval history-patient is 41 year old female with above history of metastatic breast cancer currently undergoing treatment with Herceptin and Perjeta who presents to symptom management clinic for complaints of headache and nausea.  She began having low back of head pain that radiates to her temples a couple of weeks ago. Onset typically int he afternoon. Seems to come and go. Rates 9/10 at worst. Nothing seems to make symptoms better or worse and she hasn't found that anything brings on headache. Denies aura. Has a history of headache in the past but says this pain is different. No visual changes, fevers, neck pain, accompanied weakness, syncope, or seizure. She has tried taking her usual pain medication with tylenol but symptoms don't resolve.  She also noticed increased nausea and loss of appetite. She has MRI brain planned for later this month per Dr. Janese Banks.   ECOG FS:1 - Symptomatic but completely ambulatory  Review of systems- Review of Systems  Constitutional:  Negative for chills, fever, malaise/fatigue and weight loss.  HENT:  Negative for hearing loss, nosebleeds, sore throat and tinnitus.   Eyes:  Negative for blurred vision and double vision.  Respiratory:  Negative for cough, hemoptysis, shortness of breath and wheezing.   Cardiovascular:  Negative for chest pain, palpitations and leg swelling.  Gastrointestinal:  Positive for nausea. Negative for abdominal pain, blood in stool, constipation,  diarrhea, melena and vomiting.  Genitourinary:  Negative for dysuria and urgency.  Musculoskeletal:  Negative for  back pain, falls, joint pain and myalgias.  Skin:  Negative for itching and rash.  Neurological:  Positive for headaches. Negative for dizziness, tingling, sensory change, loss of consciousness and weakness.  Endo/Heme/Allergies:  Negative for environmental allergies. Does not bruise/bleed easily.  Psychiatric/Behavioral:  Negative for depression. The patient is not nervous/anxious and does not have insomnia.     No Known Allergies  Past Medical History:  Diagnosis Date   Anxiety    Breast cancer ([place])    with mets   Cancer ([place])    Colitis    COVID-19 in immunocompromised patient ([place])    Family history of colon cancer    Vertigo    Past Surgical History:  Procedure Laterality Date   BREAST BIOPSY Left 08/14/2019   [place] bx of mass, coil marker, path pending   BREAST BIOPSY Left 08/14/2019   [place] bx of LN, hydromarker, path pending   BREAST BIOPSY Left 08/14/2019   affirm bx of calcs, x marker, path pending   ESOPHAGOGASTRODUODENOSCOPY (EGD) WITH PROPOFOL N/A 10/05/2019   Procedure: ESOPHAGOGASTRODUODENOSCOPY (EGD) WITH PROPOFOL;  Surgeon: Lin Landsman, MD;  Location: [facility];  Service: Gastroenterology;  Laterality: N/A;   FLEXIBLE SIGMOIDOSCOPY N/A 10/05/2019   Procedure: FLEXIBLE SIGMOIDOSCOPY;  Surgeon: Lin Landsman, MD;  Location: [facility] ENDOSCOPY;  Service: Gastroenterology;  Laterality: N/A;   INTRAMEDULLARY (IM) NAIL INTERTROCHANTERIC Right 09/01/2019   Procedure: INTRAMEDULLARY (IM) NAIL INTERTROCHANTRIC AND RADIOFREQUENCY ABLATION;  Surgeon: Hessie Knows, MD;  Location: [facility] ORS;  Service: Orthopedics;  Laterality: Right;   KYPHOPLASTY N/A 08/29/2019   Procedure: KYPHOPLASTY T6, L1,L4 ,  RADIOFREQUENCY ABLATION;  Surgeon: Hessie Knows, MD;  Location: [facility] ORS;  Service: Orthopedics;  Laterality: N/A;   KYPHOPLASTY Right 09/01/2019   Procedure: Right Sacral Radiofrequency Ablation and Cement Augmentation, Right sacrum and iliac crest;  Surgeon: Hessie Knows,  MD;  Location: [facility] ORS;  Service: Orthopedics;  Laterality: Right;   PORTA CATH INSERTION N/A 08/28/2019   Procedure: PORTA CATH INSERTION;  Surgeon: Algernon Huxley, MD;  Location: [facility] CV LAB;  Service: Cardiovascular;  Laterality: N/A;   Social History   Socioeconomic History   Marital status: Single    Spouse name: Not on file   Number of children: Not on file   Years of education: Not on file   Highest education level: Not on file  Occupational History   Not on file  Tobacco Use   Smoking status: Never   Smokeless tobacco: Never  Vaping Use   Vaping Use: Never used  Substance and Sexual Activity   Alcohol use: Not Currently   Drug use: Not Currently   Sexual activity: Not Currently    Birth control/protection: None  Other Topics Concern   Not on file  Social History Narrative   Lives at home with children   Social Determinants of Health   Financial Resource Strain: Not on file  Food Insecurity: Not on file  Transportation Needs: No Transportation Needs (08/05/2021)   PRAPARE - Hydrologist (Medical): No    Lack of Transportation (Non-Medical): No  Physical Activity: Not on file  Stress: Not on file  Social Connections: Not on file  Intimate Partner Violence: Not on file   Family History  Problem Relation Age of Onset   Colon cancer Maternal Uncle    Current Outpatient Medications:    benzoyl  peroxide-erythromycin (BENZAMYCIN) gel, Apply topically 2 (two) times daily., Disp: 46.6 g, Rfl: 0   Calcium Carb-Cholecalciferol 600-10 MG-MCG TABS, Take 1 tablet by mouth 2 (two) times daily., Disp: 180 tablet, Rfl: 0   DULoxetine (CYMBALTA) 60 MG capsule, Take 1 capsule (60 mg total) by mouth daily., Disp: 30 capsule, Rfl: 3   gabapentin (NEURONTIN) 600 MG tablet, Take 1 tablet (600 mg total) by mouth 3 (three) times daily., Disp: 90 tablet, Rfl: 3   lidocaine-prilocaine (EMLA) cream, Apply 1 application topically as needed. Apply small  amount to port site at least 1 hour prior to it being accessed, cover with plastic wrap, Disp: 30 g, Rfl: 3   morphine (MS CONTIN) 15 MG 12 hr tablet, Take 1 tablet (15 mg total) by mouth every 12 (twelve) hours., Disp: 60 tablet, Rfl: 0   Oxycodone HCl 10 MG TABS, Take 1/2 to 1 tablet (5-10 mg total) by mouth every 4 (four) hours as needed., Disp: 45 tablet, Rfl: 0   sulfamethoxazole-trimethoprim (BACTRIM DS) 800-160 MG tablet, Take 1 tablet by mouth 2 (two) times daily., Disp: 14 tablet, Rfl: 0   albuterol (VENTOLIN HFA) 108 (90 Base) MCG/ACT inhaler, Inhale 2 puffs into the lungs every 6 (six) hours as needed for wheezing or shortness of breath. (Patient not taking: Reported on 08/05/2021), Disp: 8.5 g, Rfl: 2   diphenoxylate-atropine (LOMOTIL) 2.5-0.025 MG tablet, Take 1 tablet by mouth 4 (four) times daily as needed for diarrhea or loose stools. (Patient not taking: Reported on 11/19/2020), Disp: 40 tablet, Rfl: 0   LORazepam (ATIVAN) 0.5 MG tablet, Take 1 tablet (0.5 mg total) by mouth every 6 (six) hours as needed for anxiety. AS NEEDED FOR NAUSEA (Patient not taking: Reported on 08/05/2021), Disp: 45 tablet, Rfl: 0   OLANZapine (ZYPREXA) 10 MG tablet, Take 1 tablet (10 mg total) by mouth at bedtime as needed (nausea). (Patient not taking: Reported on 06/24/2021), Disp: 30 tablet, Rfl: 2   ondansetron (ZOFRAN) 8 MG tablet, Take 1 tablet (8 mg total) by mouth every 8 (eight) hours as needed for nausea or vomiting. (Patient not taking: Reported on 06/24/2021), Disp: 45 tablet, Rfl: 2   polyethylene glycol powder (MIRALAX) 17 GM/SCOOP powder, Take 17 g by mouth daily as needed. (Patient not taking: Reported on 06/24/2021), Disp: 238 g, Rfl: 3 No current facility-administered medications for this visit.  Facility-Administered Medications Ordered in Other Visits:    0.9 %  sodium chloride infusion, , Intravenous, Continuous, Burns, Wandra Feinstein, NP, Stopped at 06/02/20 1453   heparin lock flush 100 unit/mL, 500  Units, Intravenous, Once, Sindy Guadeloupe, MD   heparin lock flush 100 unit/mL, 500 Units, Intravenous, Once, Sindy Guadeloupe, MD   sodium chloride flush (NS) 0.9 % injection 10 mL, 10 mL, Intravenous, Once, Sindy Guadeloupe, MD   sodium chloride flush (NS) 0.9 % injection 10 mL, 10 mL, Intravenous, Once, Sindy Guadeloupe, MD  Physical exam:  Vitals:   09/01/21 1036  Pulse: 73  Resp: 17  Temp: (!) 96.2 F (35.7 C)  TempSrc: Tympanic  SpO2: 100%  Weight: 144 lb (65.3 kg)   Physical Exam Constitutional:      General: She is not in acute distress.    Appearance: She is well-developed. She is not ill-appearing.  HENT:     Head: Atraumatic.     Nose: Nose normal.     Mouth/Throat:     Pharynx: No oropharyngeal exudate.  Eyes:     General: No  scleral icterus.    Conjunctiva/sclera: Conjunctivae normal.  Cardiovascular:     Rate and Rhythm: Normal rate and regular rhythm.  Pulmonary:     Effort: Pulmonary effort is normal.     Breath sounds: Normal breath sounds.  Abdominal:     General: There is no distension.     Palpations: Abdomen is soft.     Tenderness: There is no abdominal tenderness.  Musculoskeletal:        General: No tenderness or deformity. Normal range of motion.     Cervical back: Normal range of motion and neck supple.  Skin:    General: Skin is warm and dry.     Coloration: Skin is not pale.  Neurological:     General: No focal deficit present.     Mental Status: She is alert and oriented to person, place, and time.     Motor: Weakness (RLE weakness > LLE) present.  Psychiatric:        Mood and Affect: Mood normal.        Behavior: Behavior normal.        Latest Ref Rng & Units 09/01/2021   11:22 AM  CMP  Glucose 70 - 99 mg/dL 97   BUN 6 - 20 mg/dL 13   Creatinine 0.44 - 1.00 mg/dL 0.53   Sodium 135 - 145 mmol/L 136   Potassium 3.5 - 5.1 mmol/L 3.9   Chloride 98 - 111 mmol/L 105   CO2 22 - 32 mmol/L 24   Calcium 8.9 - 10.3 mg/dL 8.6   Total Protein  6.5 - 8.1 g/dL 8.0   Total Bilirubin 0.3 - 1.2 mg/dL 0.9   Alkaline Phos 38 - 126 U/L 82   AST 15 - 41 U/L 31   ALT 0 - 44 U/L 26       Latest Ref Rng & Units 09/01/2021   11:22 AM  CBC  WBC 4.0 - 10.5 K/uL 4.1   Hemoglobin 12.0 - 15.0 g/dL 14.4   Hematocrit 36.0 - 46.0 % 43.4   Platelets 150 - 400 K/uL 320     No images are attached to the encounter.  No results found.  Assessment and plan- Patient is a 41 y.o. female   Etiology of headaches unclear. No significant electrolyte disarrangements. She had MRI brain scheduled for later this month. Plan to mvoe up to evaluate for worsening metastatic disease. Will trial fiorcet 1-2 tab every 8 hours as needed for headache. Reviewed ER precautions for redflag symptoms. Discussed with Dr. Janese Banks who agrees with plan for MRI.   Follow up based on results   Visit Diagnosis 1. Primary malignant neoplasm of breast with metastasis [facility])     Patient expressed understanding and was in agreement with this plan. She also understands that She can call clinic at any time with any questions, concerns, or complaints.   Thank you for allowing me to participate in the care of this very pleasant patient.   Beckey Rutter, DNP, AGNP-C [name] at [place]  CC: Dr. Janese Banks

## 2021-09-02 ENCOUNTER — Encounter: Payer: Self-pay | Admitting: Oncology

## 2021-09-02 ENCOUNTER — Other Ambulatory Visit: Payer: Self-pay

## 2021-09-02 ENCOUNTER — Ambulatory Visit
Admission: RE | Admit: 2021-09-02 | Discharge: 2021-09-02 | Disposition: A | Discharge status: Home / Self Care | Payer: Self-pay | Source: Ambulatory Visit | Attending: Oncology | Admitting: Oncology

## 2021-09-02 DIAGNOSIS — C50919 Malignant neoplasm of unspecified site of unspecified female breast: Secondary | ICD-10-CM | POA: Insufficient documentation

## 2021-09-02 IMAGING — MR MR HEAD WO/W CM
12 of 16 series · 30 of 48 positions shown · IV contrast (gadavist)
Comparison: Brain MRI [DATE]

CLINICAL DATA: History of metastatic breast cancer, abnormal CT

EXAM:
MRI HEAD WITHOUT AND WITH CONTRAST
TECHNIQUE: Multiplanar, multiecho pulse sequences of the brain and surrounding
structures were obtained without and with intravenous contrast.
CONTRAST:  6mL GADAVIST GADOBUTROL 1 MMOL/ML IV SOLN

[Series 5: ax dwi_tracew · axial · 3.0mm · 0.65mm/px · z∈[-125,+30]mm · 3 of 48 slices shown]
[im 1/48]
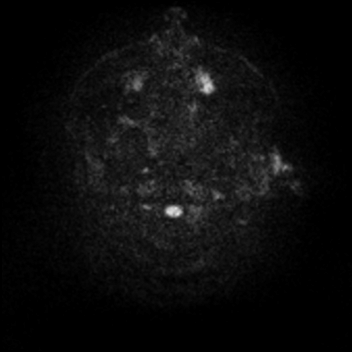
[im 24/48]
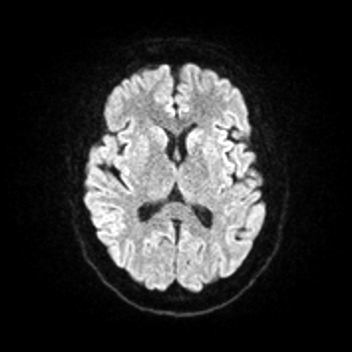
[im 48/48]
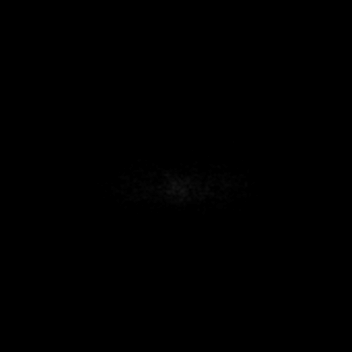

[Series 6: ax dwi_adc · axial · 3.0mm · 0.65mm/px · z∈[-125,+27]mm · 2 of 47 slices shown]
[im 1/47]
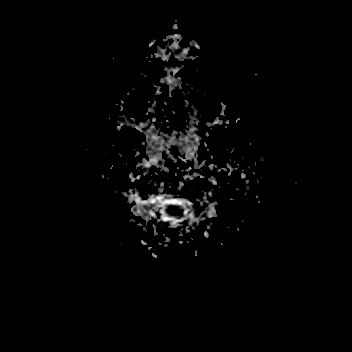
[im 47/47]
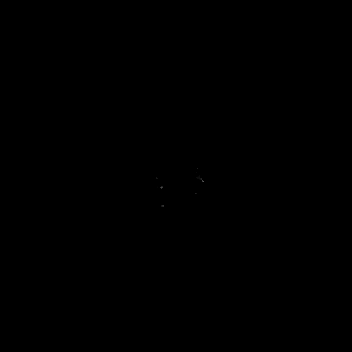

[Series 7: cor dwi_tracew · coronal · 5.0mm · 0.68mm/px · 1 of 36 slices shown]
[im 1/36]
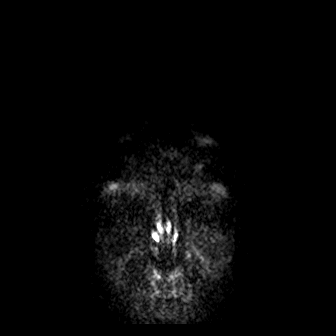

[Series 8: cor dwi_adc · coronal · 5.0mm · 0.68mm/px · 1 of 36 slices shown]
[im 1/36]
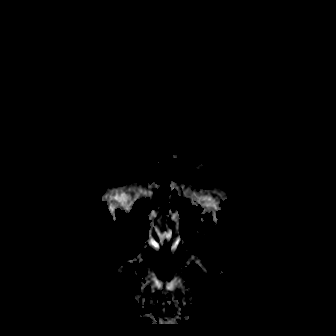

[Series 9: T1 · sagittal · 3.0mm · 0.38mm/px · 1 of 35 slices shown]
[im 1/35]
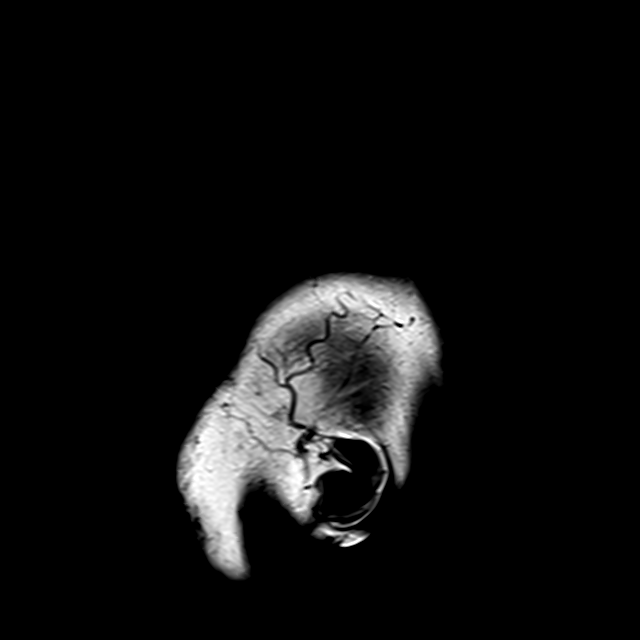

[Series 10: T2 · axial · 5.0mm · 0.53mm/px · 1 of 25 slices shown (1 of 2)]
[im 1/25]
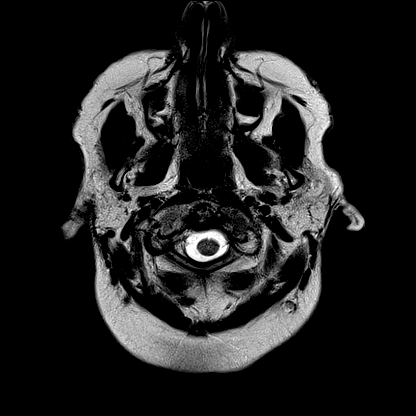

[Series 15: FLAIR · axial · 3.0mm · 0.53mm/px · z∈[-148,+13]mm · 2 of 55 slices shown]
[im 1/55]
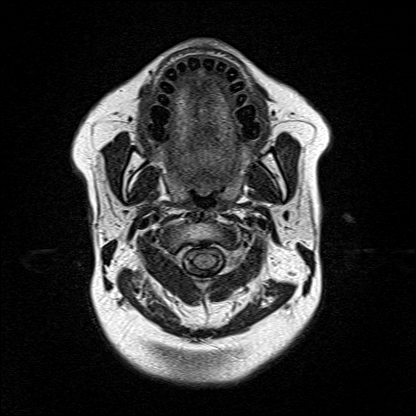
[im 55/55]
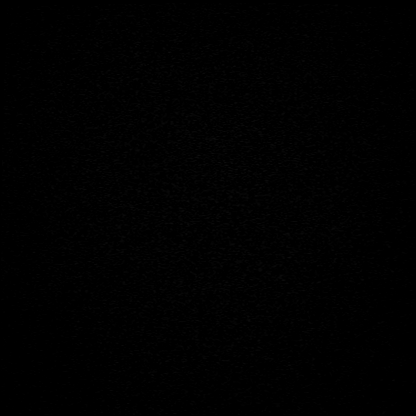

[Series 16: T2 · sagittal · non-contrast · 1.0mm · 0.98mm/px · 7 of 192 slices shown (2 of 2)]
[im 1/192]
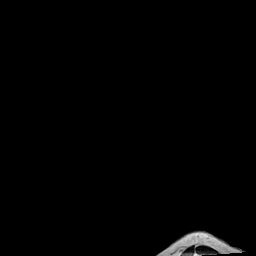
[im 32/192]
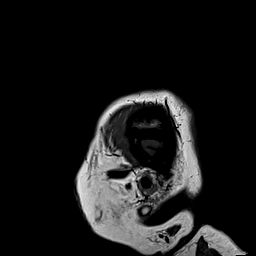
[im 64/192]
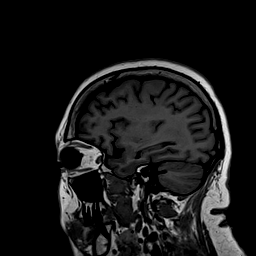
[im 96/192]
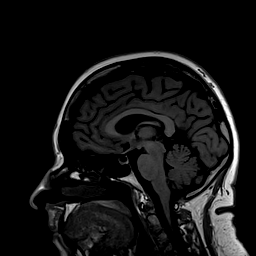
[im 128/192]
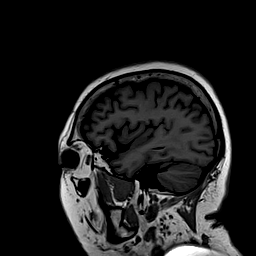
[im 160/192]
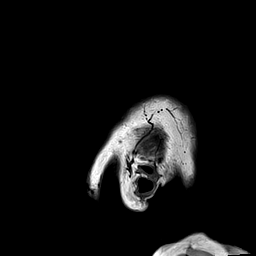
[im 192/192]
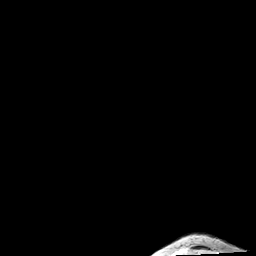

[Series 17: T2 post-contrast · coronal · 3.0mm · 0.57mm/px · 2 of 44 slices shown (1 of 2)]
[im 1/44]
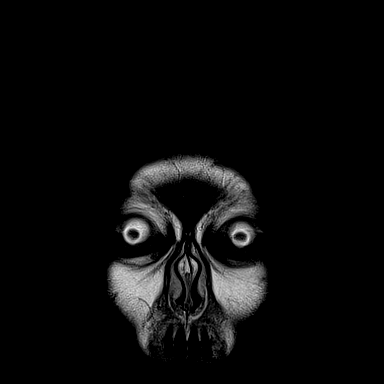
[im 44/44]
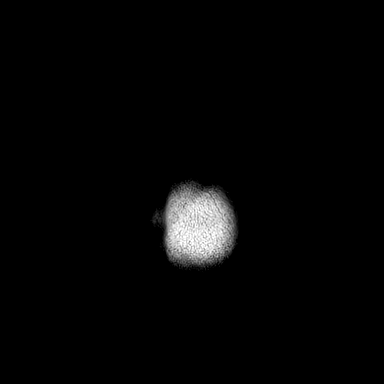

[Series 18: T2 post-contrast · sagittal · 1.0mm · 0.98mm/px · 7 of 192 slices shown (2 of 2)]
[im 1/192]
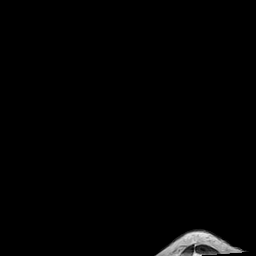
[im 32/192]
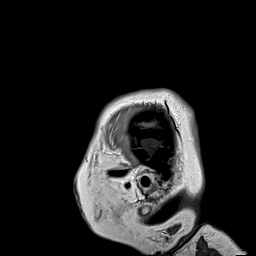
[im 64/192]
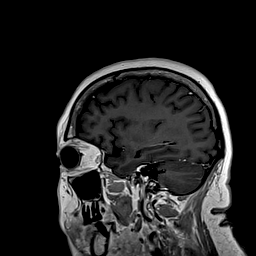
[im 96/192]
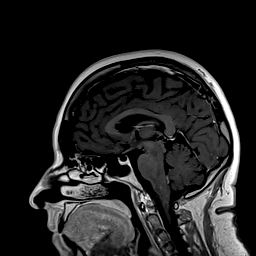
[im 128/192]
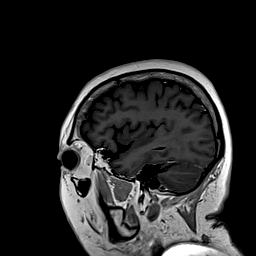
[im 160/192]
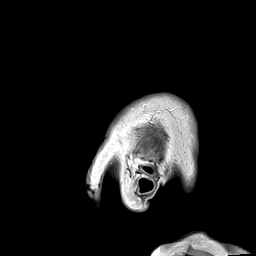
[im 192/192]
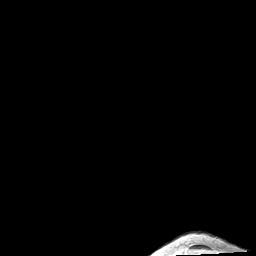

[Series 19: T1 post-contrast · coronal · 3.0mm · 0.29mm/px · 2 of 44 slices shown (1 of 2)]
[im 1/44]
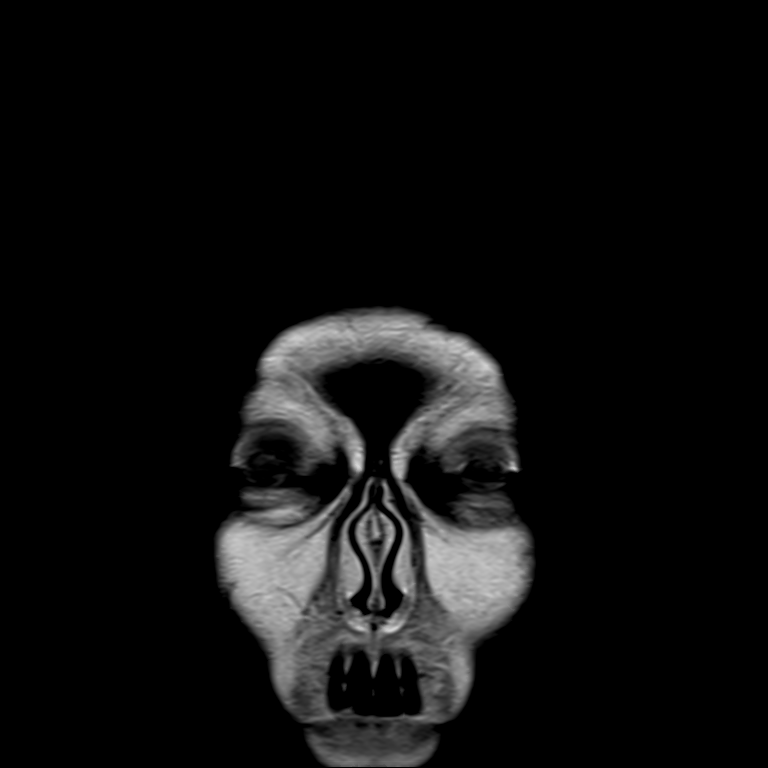
[im 44/44]
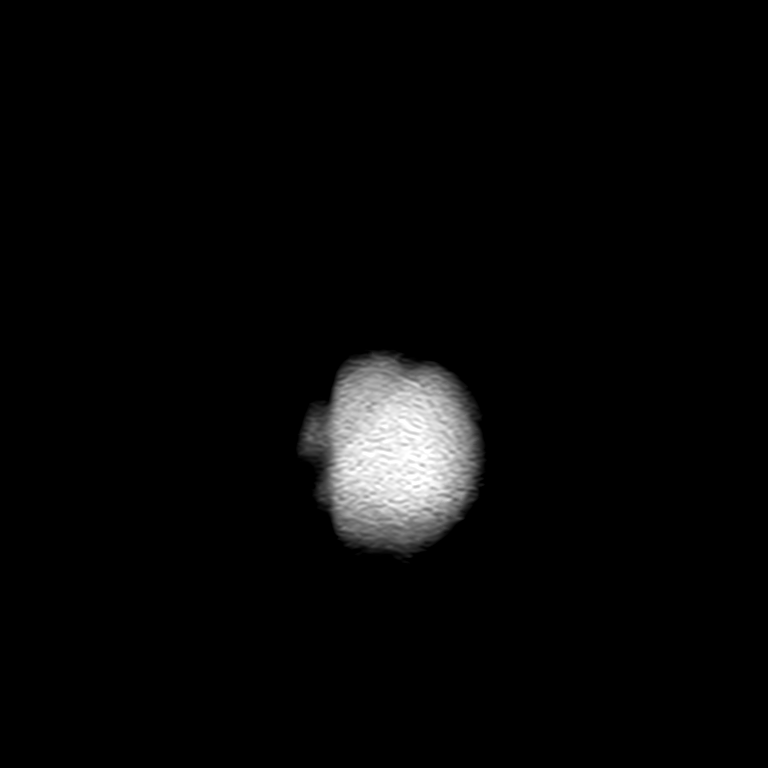

[Series 20: T1 post-contrast · sagittal · 3.0mm · 0.38mm/px · 1 of 35 slices shown (2 of 2)]
[im 1/35]
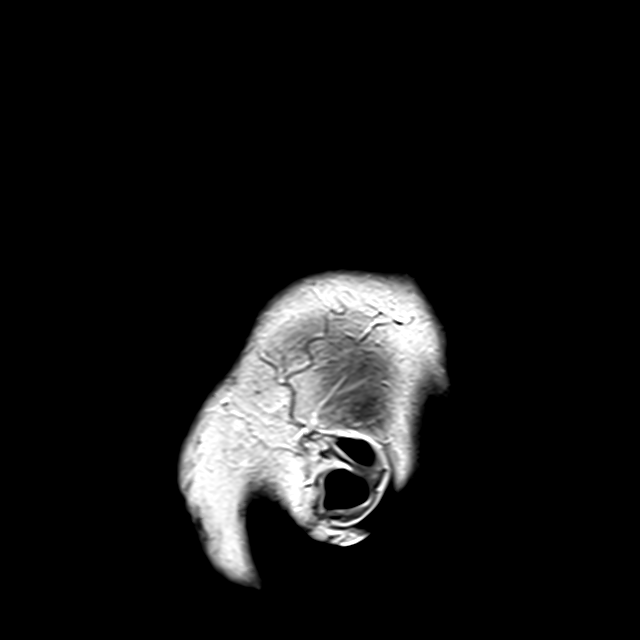

[30 of 48 positions shown; findings below may reference images not displayed]

FINDINGS: Brain: Numerous enhancing metastatic lesions are again seen.

*A punctate lesion in the right caudate head was not definitely seen
on the prior study, though this may be due to difference in
technique as black blood imaging was not performed on the prior
study ([56]-74).

Larger lesions:

*1.0 cm by 0.7 cm lesion in the left frontal operculum is slightly
increased in size when measured in the coronal plane, previously
measured 0.7 cm x 0.5 cm (19-29, [56]-77). Surrounding FLAIR signal
abnormality is slightly increased.
*1.5 cm x 1.2 cm lesion in the right frontal operculum, slightly
increased in size from 1.1 cm x 0.7 cm when measured in the coronal
plane (19-27, [56]-84). Surrounding FLAIR signal abnormality is
slightly increased. There is associated intralesional blood, also
present on the prior study.
*7 mm x 6 mm lesion in the left cerebellar hemisphere is slightly
increased in size from 6 mm x 4 mm ([56]-26).

Smaller lesions:

*4 mm lesion in the right occipital lobe appears slightly decreased
in size and conspicuity ([56]-54).

Stable lesions:

*5 mm lesion in the left parietal lobe near the vertex, unchanged
([56]-129).
*8 mm lesion in the right frontal lobe near the vertex, unchanged
([56]-125).
*3 mm lesion in the left middle frontal gyrus, not significantly
changed ([56]-110).
*Ill-defined enhancement in the left parietal cortex measuring
approximately 4-5 mm, not significantly changed ([56]-105).
*4 mm lesion in the left frontal lobe, not significantly changed
*Cortical enhancement in the left inferior parietal lobe measuring
up to 6 mm, unchanged ([56]-88).
*3 mm lesion in the right posterior temporal lobe, not significantly
changed ([56]-55).
*6 mm x 5 mm lesion in the right cerebellar hemisphere, not
significantly changed ([56]-26).
*3 mm lesion in the left cerebellum inferiorly, not significantly
changed ([56]-14).
*3 mm lesion in the right lateral cerebellum, not significantly
changed ([56]-24).

There is no acute intracranial hemorrhage, extra-axial fluid
collection, or acute infarct. Parenchymal volume is stable. The
ventricles and stable in size. There is no midline shift.

Vascular: Normal flow voids.

Skull and upper cervical spine: The 7 mm enhancing lesion in the
left frontal calvarium is not significantly changed. No new
calvarial lesions are seen.

Sinuses/Orbits: The paranasal sinuses are clear. The globes and
orbits are unremarkable.

Other: None.
IMPRESSION: 1. Newly apparent punctate lesion in the right caudate head not seen
on the study from [DATE], though note that black blood imaging
was not performed on that study. No other new lesions identified.
2. Slight interval increase in size of 3 metastatic lesions in the
bilateral frontal lobes and left cerebellar hemisphere. A single
lesion in the right occipital lobe appears slightly decreased in
size, while the remaining lesions are not significantly changed.
3. Unchanged enhancing lesion in the left frontal calvarium.

## 2021-09-02 MED ORDER — GADOBUTROL 1 MMOL/ML IV SOLN
6.0000 mL | Freq: Once | INTRAVENOUS | Status: AC | PRN
  Administered 2021-09-02: 6 mL via INTRAVENOUS

## 2021-09-02 MED ORDER — BUTALBITAL-APAP-CAFFEINE 50-325-40 MG PO TABS
1.0000 | ORAL_TABLET | Freq: Three times a day (TID) | ORAL | 0 refills | Status: DC | PRN
  Filled 2021-09-02: qty 30, 5d supply, fill #0

## 2021-09-09 ENCOUNTER — Telehealth: Payer: Self-pay | Admitting: Pharmacist

## 2021-09-09 ENCOUNTER — Encounter: Payer: Self-pay | Admitting: Oncology

## 2021-09-09 ENCOUNTER — Inpatient Hospital Stay (HOSPITAL_BASED_OUTPATIENT_CLINIC_OR_DEPARTMENT_OTHER): Payer: Self-pay | Admitting: Oncology

## 2021-09-09 ENCOUNTER — Other Ambulatory Visit: Payer: Self-pay

## 2021-09-09 DIAGNOSIS — C50919 Malignant neoplasm of unspecified site of unspecified female breast: Secondary | ICD-10-CM

## 2021-09-09 DIAGNOSIS — Z7189 Other specified counseling: Secondary | ICD-10-CM

## 2021-09-09 DIAGNOSIS — C50911 Malignant neoplasm of unspecified site of right female breast: Secondary | ICD-10-CM

## 2021-09-09 DIAGNOSIS — C7931 Secondary malignant neoplasm of brain: Secondary | ICD-10-CM

## 2021-09-09 NOTE — Progress Notes (Signed)
Pt here to get results. She is still having HA at times. Today she has right hip pain 9 level.

## 2021-09-12 ENCOUNTER — Inpatient Hospital Stay: Admission: RE | Admit: 2021-09-12 | Payer: Self-pay | Source: Ambulatory Visit

## 2021-09-12 NOTE — Telephone Encounter (Signed)
Oral Oncology Pharmacy Student Encounter  Received new prescription for Tukysa (tucatinib) and Xeloda (capecitabine) for the treatment of ER/PR-, HER2+ metastatic breast cancer in conjunction with trastuzumab, planned duration until disease progression or unacceptable drug toxicity.  CMP and CBC w/ diff from 09/01/21 assessed, no relevant lab abnormalities. Prescription dose and frequency assessed.   Current medication list in Epic reviewed, 2 DDIs with tucatinib identified: Morphine: Tucatinib may increase concentrations and effects of morphine. Monitor for signs/symptoms of opioid toxicities, dose reduction of morphine may be required. Oxycodone: Tucatinib may increase concentrations and effects of oxycodone. Monitor for signs/symptoms of opioid toxicities, dose reduction of oxycodone may be required.  Evaluated chart and no patient barriers to medication adherence identified.   [facility] will continue to follow for insurance authorization, copayment issues, initial counseling and start date.  Garrel Ridgel, PharmD Candidate [facility]  [place]/[facility] [phone]  09/12/2021 11:11 AM

## 2021-09-14 ENCOUNTER — Encounter: Payer: Self-pay | Admitting: Oncology

## 2021-09-14 ENCOUNTER — Other Ambulatory Visit: Payer: Self-pay

## 2021-09-15 MED ORDER — CAPECITABINE 500 MG PO TABS: 1500.0000 mg | ORAL_TABLET | Freq: Two times a day (BID) | ORAL | 1 refills | Status: DC

## 2021-09-15 MED ORDER — TUCATINIB 150 MG PO TABS: 300.0000 mg | ORAL_TABLET | Freq: Two times a day (BID) | ORAL | 1 refills | Status: DC

## 2021-09-16 ENCOUNTER — Encounter: Payer: Self-pay | Admitting: Oncology

## 2021-09-16 ENCOUNTER — Inpatient Hospital Stay: Payer: Self-pay

## 2021-09-16 ENCOUNTER — Inpatient Hospital Stay: Payer: Self-pay | Admitting: Pharmacist

## 2021-09-16 ENCOUNTER — Inpatient Hospital Stay (HOSPITAL_BASED_OUTPATIENT_CLINIC_OR_DEPARTMENT_OTHER): Payer: Self-pay | Admitting: Oncology

## 2021-09-16 ENCOUNTER — Telehealth: Payer: Self-pay | Admitting: Pharmacy Technician

## 2021-09-16 ENCOUNTER — Other Ambulatory Visit: Payer: Self-pay

## 2021-09-16 VITALS — BP 92/62 | HR 83 | Temp 98.4°F | Ht 62.0 in | Wt 146.0 lb

## 2021-09-16 DIAGNOSIS — Z7189 Other specified counseling: Secondary | ICD-10-CM

## 2021-09-16 DIAGNOSIS — C50919 Malignant neoplasm of unspecified site of unspecified female breast: Secondary | ICD-10-CM

## 2021-09-16 DIAGNOSIS — C7951 Secondary malignant neoplasm of bone: Secondary | ICD-10-CM

## 2021-09-16 DIAGNOSIS — Z5112 Encounter for antineoplastic immunotherapy: Secondary | ICD-10-CM

## 2021-09-16 DIAGNOSIS — M79605 Pain in left leg: Secondary | ICD-10-CM

## 2021-09-16 DIAGNOSIS — M8440XA Pathological fracture, unspecified site, initial encounter for fracture: Secondary | ICD-10-CM

## 2021-09-16 LAB — COMPREHENSIVE METABOLIC PANEL
ALT: 17 U/L (ref 0–44)
AST: 26 U/L (ref 15–41)
Albumin: 3.9 g/dL (ref 3.5–5.0)
Alkaline Phosphatase: 71 U/L (ref 38–126)
Anion gap: 8 (ref 5–15)
BUN: 10 mg/dL (ref 6–20)
CO2: 25 mmol/L (ref 22–32)
Calcium: 8.7 mg/dL — ABNORMAL LOW (ref 8.9–10.3)
Chloride: 104 mmol/L (ref 98–111)
Creatinine, Ser: 0.46 mg/dL (ref 0.44–1.00)
GFR, Estimated: >60 mL/min (ref >60)
Glucose, Bld: 129 mg/dL — ABNORMAL HIGH (ref 70–99)
Potassium: 3.6 mmol/L (ref 3.5–5.1)
Sodium: 137 mmol/L (ref 135–145)
Total Bilirubin: 0.7 mg/dL (ref 0.3–1.2)
Total Protein: 7.5 g/dL (ref 6.5–8.1)

## 2021-09-16 LAB — CBC WITH DIFFERENTIAL/PLATELET
Abs Immature Granulocytes: 0.02 10*3/uL (ref 0.00–0.07)
Basophils Absolute: 0 10*3/uL (ref 0.0–0.1)
Basophils Relative: 0 %
Eosinophils Absolute: 0.2 10*3/uL (ref 0.0–0.5)
Eosinophils Relative: 3 %
HCT: 40.1 % (ref 36.0–46.0)
Hemoglobin: 13.3 g/dL (ref 12.0–15.0)
Immature Granulocytes: 0 %
Lymphocytes Relative: 27 %
Lymphs Abs: 1.6 10*3/uL (ref 0.7–4.0)
MCH: 28.2 pg (ref 26.0–34.0)
MCHC: 33.2 g/dL (ref 30.0–36.0)
MCV: 85 fL (ref 80.0–100.0)
Monocytes Absolute: 0.3 10*3/uL (ref 0.1–1.0)
Monocytes Relative: 5 %
Neutro Abs: 3.9 10*3/uL (ref 1.7–7.7)
Neutrophils Relative %: 65 %
Platelets: 350 10*3/uL (ref 150–400)
RBC: 4.72 MIL/uL (ref 3.87–5.11)
RDW: 13.5 % (ref 11.5–15.5)
WBC: 5.9 10*3/uL (ref 4.0–10.5)
nRBC: 0 % (ref 0.0–0.2)

## 2021-09-16 MED ORDER — DIPHENHYDRAMINE HCL 50 MG/ML IJ SOLN
50.0000 mg | Freq: Once | INTRAMUSCULAR | Status: AC
  Administered 2021-09-16: 50 mg via INTRAVENOUS
  Filled 2021-09-16: qty 1

## 2021-09-16 MED ORDER — NALOXONE HCL 4 MG/0.1ML NA LIQD
NASAL | 3 refills | Status: DC
  Filled 2021-09-16: qty 2, fill #0
  Filled 2021-10-07: qty 2, 1d supply, fill #0

## 2021-09-16 MED ORDER — MORPHINE SULFATE (PF) 2 MG/ML IV SOLN: 2.0000 mg | Freq: Once | INTRAVENOUS | Status: DC

## 2021-09-16 MED ORDER — ACETAMINOPHEN 325 MG PO TABS
650.0000 mg | ORAL_TABLET | Freq: Once | ORAL | Status: AC
  Administered 2021-09-16: 650 mg via ORAL
  Filled 2021-09-16: qty 2

## 2021-09-16 MED ORDER — HEPARIN SOD (PORK) LOCK FLUSH 100 UNIT/ML IV SOLN
500.0000 [IU] | Freq: Once | INTRAVENOUS | Status: AC | PRN
  Filled 2021-09-16: qty 5

## 2021-09-16 MED ORDER — TRASTUZUMAB-ANNS CHEMO 150 MG IV SOLR
6.0000 mg/kg | Freq: Once | INTRAVENOUS | Status: AC
  Administered 2021-09-16: 399 mg via INTRAVENOUS
  Filled 2021-09-16: qty 19

## 2021-09-16 MED ORDER — MORPHINE SULFATE (PF) 2 MG/ML IV SOLN
2.0000 mg | Freq: Once | INTRAVENOUS | Status: AC
  Administered 2021-09-16: 2 mg via INTRAVENOUS
  Filled 2021-09-16: qty 1

## 2021-09-16 MED ORDER — SODIUM CHLORIDE 0.9 % IV SOLN
Freq: Once | INTRAVENOUS | Status: AC
  Filled 2021-09-16: qty 250

## 2021-09-16 MED ORDER — TRASTUZUMAB-ANNS CHEMO 150 MG IV SOLR: 6.0000 mg/kg | Freq: Once | INTRAVENOUS | Status: DC

## 2021-09-16 MED ORDER — DENOSUMAB 120 MG/1.7ML ~~LOC~~ SOLN
120.0000 mg | Freq: Once | SUBCUTANEOUS | Status: AC
  Administered 2021-09-16: 120 mg via SUBCUTANEOUS
  Filled 2021-09-16: qty 1.7

## 2021-09-16 MED ORDER — HEPARIN SOD (PORK) LOCK FLUSH 100 UNIT/ML IV SOLN
INTRAVENOUS | Status: AC
  Administered 2021-09-16: 500 [IU]
  Filled 2021-09-16: qty 5

## 2021-09-16 MED ORDER — SODIUM CHLORIDE 0.9 % IV SOLN
420.0000 mg | Freq: Once | INTRAVENOUS | Status: AC
  Administered 2021-09-16: 420 mg via INTRAVENOUS
  Filled 2021-09-16: qty 14

## 2021-09-16 NOTE — Progress Notes (Signed)
Oral Chemotherapy Pharmacist Encounter  She will not getting started on tucatinib and capecitabine until 10/07/21.  Patient Education I spoke with patient for overview of new oral chemotherapy medication: Tukysa (tucatinib) and Xeloda (capecitabine) for the treatment of ER/PR-, HER2+ metastatic breast cancer in conjunction with trastuzumab, planned duration until disease progression or unacceptable drug toxicity.  Pt is doing well. Counseled patient on administration, dosing, side effects, monitoring, drug-food interactions, safe handling, storage, and disposal. Patient will take: Xeloda: Take 3 tablets (1,500 mg total) by mouth 2 (two) times daily after a meal. Take for 14 days, then hold for 7 days. Repeat every 21 days. Laury Axon: Take 2 tablets (300 mg total) by mouth 2 (two) times daily.  Side effects include but not limited to: diarrhea, N/V, HFS, mouth sores, fatigue, anemia, neutropenia, thrombocytopenia, increased LFT and SrCr.    Counseled patient on possibility of increased toxicity from opioids due to DDIs with Tukysa and morphine/oxycodone. Educated on Narcan use/administration.  Reviewed with patient importance of keeping a medication schedule and plan for any missed doses.  After discussion with patient no patient barriers to medication adherence identified.   [name] voiced understanding and appreciation. All questions answered. Medication handout provided.  Provided patient with [facility] phone number. Patient knows to call the office with questions or concerns. Oral Chemotherapy [facility] will continue to follow.  Darl Pikes, PharmD, BCPS, BCOP, CPP Hematology/Oncology Clinical Pharmacist Practitioner [place]/[facility] [phone]  09/16/2021 10:37 AM

## 2021-09-16 NOTE — Progress Notes (Signed)
Hematology/Oncology Consult note [facility]  Telephone:([phone] [phone]  Patient Care Team: Sindy Guadeloupe, MD as PCP - General (Oncology) Rico Junker, RN as Registered Nurse Theodore Demark, RN (Inactive) as Registered Nurse Sindy Guadeloupe, MD as Consulting Physician (Hematology and Oncology)   Name of the patient: Consepcion [name]  [id number]  [birth year]   Date of visit: 09/16/21  Diagnosis- stage IV metastatic breast cancer ER/PR negative HER-2/neu positive with bone metastases  Chief complaint/ Reason for visit-on treatment assessment prior to next cycle of Herceptin and Perjeta and discuss further goals of care  Heme/Onc history: patient is a 41 year old Hispanic female who is here with her friend.  History obtained with the help of an interpreter.Patient self palpated left breast mass which was followed by a diagnostic bilateral mammogram.  Mammogram showed 3.1 x 2.9 x 1.9 cm hypoechoic mass at the 1 o'clock position of the left breast.  For abnormal cortically thickened left axillary lymph nodes measuring up to 5 mm.  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  Patient was also having ongoing back pain and was seen by [facility] Dr. Doyle Askew who ordered MRI lumbar spine without contrast which showed possible pathologic fractures of L1 and L4 vertebral bodies with greater than 50% height loss at L1 and abnormal signal involving L2-L3 S1 as well as right iliac bone concerning for metastatic disease.  Patient is a single mother of 3 adult children and is very anxious today.  She reports significant back pain which radiates to her bilateral thighs.  Denies any focal tingling numbness or weakness.  Denies any bowel bladder incontinence.  Pain has been uncontrolled despite taking Tylenol.  No prior history of abnormal  breast biopsies.  No family history of breast cancer   PET and MRI showed 3 areas of pathologic fracture of her spine as well as widespread bony metastatic disease and concern for impending fracture of the right hip.  Given her worsening pain she was asked to come to the ER.  She has been evaluated by Dr. Rudene Christians from orthopedic surgery and underwent kyphoplasty at 3 different levels. T6 L1 and L4 along with radiofrequency ablation.    She also underwent prophylactic fixation of the right hip and not affected the sacral region.     Patient received first dose of Herceptin and Perjeta on 09/04/2019.  Patient initially received Taxol and Herceptin perjeta.  Presently patient is on maintenance Herceptin and Perjeta alone.  Patient found to have brain mets in November 2022 for which she received whole brain radiation treatment but there was no evidence of systemic progression     Interval history-patient reports occasional epigastric pain and nausea.  Denies any new complaints at this time.  She has some issue with port access site pain today.  History obtained with the help of Spanish interpreter  ECOG PS- 1 Pain scale- 2 Opioid associated constipation- no  Review of systems- Review of Systems  Constitutional:  Positive for malaise/fatigue. Negative for chills, fever and weight loss.  HENT:  Negative for congestion, ear discharge and nosebleeds.   Eyes:  Negative for blurred vision.  Respiratory:  Negative for cough, hemoptysis, sputum production, shortness of breath and wheezing.   Cardiovascular:  Negative for chest pain, palpitations, orthopnea and claudication.  Gastrointestinal:  Negative for abdominal pain, blood in  stool, constipation, diarrhea, heartburn, melena, nausea and vomiting.  Genitourinary:  Negative for dysuria, flank pain, frequency, hematuria and urgency.  Musculoskeletal:  Negative for back pain, joint pain and myalgias.       Right hip pain  Skin:  Negative for rash.   Neurological:  Negative for dizziness, tingling, focal weakness, seizures, weakness and headaches.  Endo/Heme/Allergies:  Does not bruise/bleed easily.  Psychiatric/Behavioral:  Negative for depression and suicidal ideas. The patient does not have insomnia.       No Known Allergies   Past Medical History:  Diagnosis Date   Anxiety    Breast cancer ([place])    with mets   Cancer ([place])    Colitis    COVID-19 in immunocompromised patient ([place])    Family history of colon cancer    Vertigo      Past Surgical History:  Procedure Laterality Date   BREAST BIOPSY Left 08/14/2019   [place] bx of mass, coil marker, path pending   BREAST BIOPSY Left 08/14/2019   [place] bx of LN, hydromarker, path pending   BREAST BIOPSY Left 08/14/2019   affirm bx of calcs, x marker, path pending   ESOPHAGOGASTRODUODENOSCOPY (EGD) WITH PROPOFOL N/A 10/05/2019   Procedure: ESOPHAGOGASTRODUODENOSCOPY (EGD) WITH PROPOFOL;  Surgeon: Lin Landsman, MD;  Location: [place];  Service: Gastroenterology;  Laterality: N/A;   FLEXIBLE SIGMOIDOSCOPY N/A 10/05/2019   Procedure: FLEXIBLE SIGMOIDOSCOPY;  Surgeon: Lin Landsman, MD;  Location: [facility];  Service: Gastroenterology;  Laterality: N/A;   INTRAMEDULLARY (IM) NAIL INTERTROCHANTERIC Right 09/01/2019   Procedure: INTRAMEDULLARY (IM) NAIL INTERTROCHANTRIC AND RADIOFREQUENCY ABLATION;  Surgeon: Hessie Knows, MD;  Location: [facility] ORS;  Service: Orthopedics;  Laterality: Right;   KYPHOPLASTY N/A 08/29/2019   Procedure: KYPHOPLASTY T6, L1,L4 ,  RADIOFREQUENCY ABLATION;  Surgeon: Hessie Knows, MD;  Location: [facility] ORS;  Service: Orthopedics;  Laterality: N/A;   KYPHOPLASTY Right 09/01/2019   Procedure: Right Sacral Radiofrequency Ablation and Cement Augmentation, Right sacrum and iliac crest;  Surgeon: Hessie Knows, MD;  Location: [facility];  Service: Orthopedics;  Laterality: Right;   PORTA CATH INSERTION N/A 08/28/2019   Procedure: PORTA CATH INSERTION;   Surgeon: Algernon Huxley, MD;  Location: [place] CV LAB;  Service: Cardiovascular;  Laterality: N/A;    Social History   Socioeconomic History   Marital status: Single    Spouse name: Not on file   Number of children: Not on file   Years of education: Not on file   Highest education level: Not on file  Occupational History   Not on file  Tobacco Use   Smoking status: Never   Smokeless tobacco: Never  Vaping Use   Vaping Use: Never used  Substance and Sexual Activity   Alcohol use: Not Currently   Drug use: Not Currently   Sexual activity: Not Currently    Birth control/protection: None  Other Topics Concern   Not on file  Social History Narrative   Lives at home with children   Social Determinants of Health   Financial Resource Strain: Not on file  Food Insecurity: Not on file  Transportation Needs: No Transportation Needs (08/05/2021)   PRAPARE - Hydrologist (Medical): No    Lack of Transportation (Non-Medical): No  Physical Activity: Not on file  Stress: Not on file  Social Connections: Not on file  Intimate Partner Violence: Not on file    Family History  Problem Relation Age of Onset   Colon cancer  Maternal Uncle      Current Outpatient Medications:    albuterol (VENTOLIN HFA) 108 (90 Base) MCG/ACT inhaler, Inhale 2 puffs into the lungs every 6 (six) hours as needed for wheezing or shortness of breath., Disp: 8.5 g, Rfl: 2   Calcium Carb-Cholecalciferol 600-10 MG-MCG TABS, Take 1 tablet by mouth 2 (two) times daily., Disp: 180 tablet, Rfl: 0   capecitabine (XELODA) 500 MG tablet, Take 3 tablets (1,500 mg total) by mouth 2 (two) times daily after a meal. Take for 14 days, then hold for 7 days. Repeat every 21 days., Disp: 84 tablet, Rfl: 1   DULoxetine (CYMBALTA) 60 MG capsule, Take 1 capsule (60 mg total) by mouth daily., Disp: 30 capsule, Rfl: 3   gabapentin (NEURONTIN) 600 MG tablet, Take 1 tablet (600 mg total) by mouth 3  (three) times daily., Disp: 90 tablet, Rfl: 3   lidocaine-prilocaine (EMLA) cream, Apply 1 application topically as needed. Apply small amount to port site at least 1 hour prior to it being accessed, cover with plastic wrap, Disp: 30 g, Rfl: 3   morphine (MS CONTIN) 15 MG 12 hr tablet, Take 1 tablet (15 mg total) by mouth every 12 (twelve) hours., Disp: 60 tablet, Rfl: 0   ondansetron (ZOFRAN) 8 MG tablet, Take 1 tablet (8 mg total) by mouth every 8 (eight) hours as needed for nausea or vomiting., Disp: 45 tablet, Rfl: 2   Oxycodone HCl 10 MG TABS, Take 1/2 to 1 tablet (5-10 mg total) by mouth every 4 (four) hours as needed., Disp: 45 tablet, Rfl: 0   polyethylene glycol powder (MIRALAX) 17 GM/SCOOP powder, Take 17 g by mouth daily as needed., Disp: 238 g, Rfl: 3   tucatinib (TUKYSA) 150 MG tablet, Take 2 tablets (300 mg total) by mouth 2 (two) times daily., Disp: 120 tablet, Rfl: 1   benzoyl peroxide-erythromycin (BENZAMYCIN) gel, Apply topically 2 (two) times daily. (Patient not taking: Reported on 09/09/2021), Disp: 46.6 g, Rfl: 0   butalbital-acetaminophen-caffeine (FIORICET) 50-325-40 MG tablet, Take 1-2 tablets by mouth every 8 (eight) hours as needed for headache. (Patient not taking: Reported on 09/09/2021), Disp: 30 tablet, Rfl: 0   diphenoxylate-atropine (LOMOTIL) 2.5-0.025 MG tablet, Take 1 tablet by mouth 4 (four) times daily as needed for diarrhea or loose stools. (Patient not taking: Reported on 11/19/2020), Disp: 40 tablet, Rfl: 0   LORazepam (ATIVAN) 0.5 MG tablet, Take 1 tablet (0.5 mg total) by mouth every 6 (six) hours as needed for anxiety. AS NEEDED FOR NAUSEA (Patient not taking: Reported on 08/05/2021), Disp: 45 tablet, Rfl: 0   OLANZapine (ZYPREXA) 10 MG tablet, Take 1 tablet (10 mg total) by mouth at bedtime as needed (nausea). (Patient not taking: Reported on 06/24/2021), Disp: 30 tablet, Rfl: 2 No current facility-administered medications for this visit.  Facility-Administered  Medications Ordered in Other Visits:    0.9 %  sodium chloride infusion, , Intravenous, Continuous, Burns, Wandra Feinstein, NP, Stopped at 06/02/20 1453   heparin lock flush 100 UNIT/ML injection, , , ,    heparin lock flush 100 unit/mL, 500 Units, Intravenous, Once, Sindy Guadeloupe, MD   heparin lock flush 100 unit/mL, 500 Units, Intravenous, Once, Sindy Guadeloupe, MD   heparin lock flush 100 unit/mL, 500 Units, Intracatheter, Once PRN, Sindy Guadeloupe, MD   pertuzumab (PERJETA) 420 mg in sodium chloride 0.9 % 250 mL chemo infusion, 420 mg, Intravenous, Once, Sindy Guadeloupe, MD   sodium chloride flush (NS) 0.9 % injection 10  mL, 10 mL, Intravenous, Once, Sindy Guadeloupe, MD   sodium chloride flush (NS) 0.9 % injection 10 mL, 10 mL, Intravenous, Once, Sindy Guadeloupe, MD   trastuzumab-anns [facility]) 399 mg in sodium chloride 0.9 % 250 mL chemo infusion, 6 mg/kg (Order-Specific), Intravenous, Once, Sindy Guadeloupe, MD  Physical exam:  Vitals:   09/16/21 0915  BP: 92/62  Pulse: 83  Temp: 98.4 F (36.9 C)  TempSrc: Tympanic  SpO2: 99%  Weight: 146 lb (66.2 kg)  Height: _0  (1.575 m)   Physical Exam Cardiovascular:     Rate and Rhythm: Normal rate and regular rhythm.     Heart sounds: Normal heart sounds.  Pulmonary:     Effort: Pulmonary effort is normal.     Breath sounds: Normal breath sounds.  Abdominal:     General: Bowel sounds are normal.     Palpations: Abdomen is soft.  Skin:    General: Skin is warm and dry.  Neurological:     Mental Status: She is alert and oriented to person, place, and time.         Latest Ref Rng & Units 09/16/2021    9:16 AM  CMP  Glucose 70 - 99 mg/dL 129   BUN 6 - 20 mg/dL 10   Creatinine 0.44 - 1.00 mg/dL 0.46   Sodium 135 - 145 mmol/L 137   Potassium 3.5 - 5.1 mmol/L 3.6   Chloride 98 - 111 mmol/L 104   CO2 22 - 32 mmol/L 25   Calcium 8.9 - 10.3 mg/dL 8.7   Total Protein 6.5 - 8.1 g/dL 7.5   Total Bilirubin 0.3 - 1.2 mg/dL 0.7   Alkaline  Phos 38 - 126 U/L 71   AST 15 - 41 U/L 26   ALT 0 - 44 U/L 17       Latest Ref Rng & Units 09/16/2021    9:16 AM  CBC  WBC 4.0 - 10.5 K/uL 5.9   Hemoglobin 12.0 - 15.0 g/dL 13.3   Hematocrit 36.0 - 46.0 % 40.1   Platelets 150 - 400 K/uL 350     No images are attached to the encounter.  MR Brain W Wo Contrast  Result Date: 09/05/2021 CLINICAL DATA:  History of metastatic breast cancer, abnormal CT EXAM: MRI HEAD WITHOUT AND WITH CONTRAST TECHNIQUE: Multiplanar, multiecho pulse sequences of the brain and surrounding structures were obtained without and with intravenous contrast. CONTRAST:  25m GADAVIST GADOBUTROL 1 MMOL/ML IV SOLN COMPARISON:  Brain MRI 05/11/2021 FINDINGS: Brain: Numerous enhancing metastatic lesions are again seen. *A punctate lesion in the right caudate head was not definitely seen on the prior study, though this may be due to difference in technique as black blood imaging was not performed on the prior study (1026-74). Larger lesions: *1.0 cm by 0.7 cm lesion in the left frontal operculum is slightly increased in size when measured in the coronal plane, previously measured 0.7 cm x 0.5 cm (19-29, 1026-77). Surrounding FLAIR signal abnormality is slightly increased. *1.5 cm x 1.2 cm lesion in the right frontal operculum, slightly increased in size from 1.1 cm x 0.7 cm when measured in the coronal plane (19-27, 1026-84). Surrounding FLAIR signal abnormality is slightly increased. There is associated intralesional blood, also present on the prior study. *7 mm x 6 mm lesion in the left cerebellar hemisphere is slightly increased in size from 6 mm x 4 mm (1026-26). Smaller lesions: *4 mm lesion in the right occipital lobe appears  slightly decreased in size and conspicuity (1026-54). Stable lesions: *5 mm lesion in the left parietal lobe near the vertex, unchanged (8088-110). *8 mm lesion in the right frontal lobe near the vertex, unchanged (3159-458). *3 mm lesion in the left middle  frontal gyrus, not significantly changed (1026-110). *Ill-defined enhancement in the left parietal cortex measuring approximately 4-5 mm, not significantly changed (1026-105). *4 mm lesion in the left frontal lobe, not significantly changed (1026-97). *Cortical enhancement in the left inferior parietal lobe measuring up to 6 mm, unchanged (1026-88). *3 mm lesion in the right posterior temporal lobe, not significantly changed (1026-55). *6 mm x 5 mm lesion in the right cerebellar hemisphere, not significantly changed (1026-26). *3 mm lesion in the left cerebellum inferiorly, not significantly changed (1026-14). *3 mm lesion in the right lateral cerebellum, not significantly changed (1026-24). There is no acute intracranial hemorrhage, extra-axial fluid collection, or acute infarct. Parenchymal volume is stable. The ventricles and stable in size. There is no midline shift. Vascular: Normal flow voids. Skull and upper cervical spine: The 7 mm enhancing lesion in the left frontal calvarium is not significantly changed. No new calvarial lesions are seen. Sinuses/Orbits: The paranasal sinuses are clear. The globes and orbits are unremarkable. Other: None. IMPRESSION: 1. Newly apparent punctate lesion in the right caudate head not seen on the study from 05/10/2021, though note that black blood imaging was not performed on that study. No other new lesions identified. 2. Slight interval increase in size of 3 metastatic lesions in the bilateral frontal lobes and left cerebellar hemisphere. A single lesion in the right occipital lobe appears slightly decreased in size, while the remaining lesions are not significantly changed. 3. Unchanged enhancing lesion in the left frontal calvarium. Electronically Signed   By: Valetta Mole M.D.   On: 09/05/2021 09:05     Assessment and plan- Patient is a 41 y.o. female with ER negative HER2 positive metastatic breast cancer with bone metastases.  She is here for on treatment assessment  prior to cycle 33 of Herceptin and Perjeta to discuss further management  I discussed with the patient that her recent MRI showed increase in the size of at least 4 metastatic lesions by a few millimeters.  Other lesions were stable.  Systemically she has not had any progression based on her last scan in May 2023.  I will plan to get a repeat systemic scan in August.  Initially my plan was to continue Herceptin and Perjeta with the short follow-up MRI brain.  However given her young age and continued progression in the brain I do plan to switch her to tucatinib Herceptin Xeloda regimen at this time.  We are currently working cantilever as well as Xeloda approval.  Discussed risks and benefits of Port-A-Cath abdomen Xeloda including all but not limited to nausea, vomiting, low blood counts, electrolyte disturbance and abnormal LFTs.  Hopefully we should get these drugs in the next 2 weeks.  When I see her back in 3 weeks she will only be receiving Herceptin and can start to get an event Xeloda at that time.  In a randomized trial including 480 heavily pretreated patients with HER2-positive metastatic breast cancer (median of four prior lines of therapy), the one-year PFS rate was 33 percent among patients receiving tucatinib-capecitabine-trastuzumab versus 12 percent among those receiving capecitabine-trastuzumab only (HR for disease progression or death 0.54, 95% CI 0.42-0.71), and the median duration of PFS was 7.8 and 5.6 months, respectively. OS at two years was 45 percent  in the tucatinib-combination group and 27 percent in the placebo-combination group (HR for death 0.66, 95% CI 0.50-0.88). Among patients with brain metastases, the one-year PFS rate among those receiving the capecitabine-trastuzumab combination with and without tucatinib was 25 versus 0 percent, respectively.  Patient understands and agrees to proceed as planned. ' Neoplasm related pain: Continue as needed oxycodone   Visit  Diagnosis 1. Primary malignant neoplasm of breast with metastasis ([place])   2. Encounter for monoclonal antibody treatment for malignancy   3. Goals of care, counseling/discussion      Dr. Randa Evens, MD, MPH [facility] at [facility] [phone] 09/16/2021 11:11 AM

## 2021-09-16 NOTE — Telephone Encounter (Signed)
Oral Oncology Patient Advocate Encounter  Patient is uninsured. Lowest OOP cost for Capecitabine is with [email or web address] pharmacy.  Patient will have to set up an account and inform office of the email used to set up the account to put on prescription so it can be linked to her pharmacy account.  Another option is [facility] where patient can receive discount pricing but will be a higher cost. (~$60 a cycle vs under $40 with [facility]).  Will discuss options today with patient to see how she wants to proceed.  For Laury Axon, I will have patient complete the patient assistance application for [employer] to see if she qualifies to receive medication at no cost.  [place] Patient Luis M. [place]int[place] Phone [phone] Fax [phone] 09/16/2021 9:34 AM

## 2021-09-16 NOTE — Progress Notes (Signed)
Pt having stomach pain when she eats, so she's eating less. This has been going on x1week. Having some nausea and vomiting.

## 2021-09-16 NOTE — Telephone Encounter (Signed)
Oral Oncology Patient Advocate Encounter  Met patient after appt today to complete application for [employer] PAP in an effort to reduce patient's out of pocket expense for Tukysa to $0.    Application completed and faxed to [phone].   [employer] patient assistance phone number for follow up is [phone].   This encounter will be updated until final determination.   [place] Patient [place] Phone [phone] Fax [phone] 09/16/2021 11:24 AM

## 2021-09-18 ENCOUNTER — Encounter: Payer: Self-pay | Admitting: Oncology

## 2021-09-18 NOTE — Progress Notes (Signed)
Hematology/Oncology Consult note [facility]  Telephone:([phone] [phone]  Patient Care Team: Creig Hines, MD as PCP - General (Oncology) Jim Like, RN as Registered Nurse Scarlett Presto, RN (Inactive) as Registered Nurse Creig Hines, MD as Consulting Physician (Hematology and Oncology)   Name of the patient: Sophia [name]  [id number]  01/22/1981   Date of visit: 09/18/21  Diagnosis- stage IV metastatic breast cancer ER/PR negative HER-2/neu positive with bone metastases  Chief complaint/ Reason for visit- discuss mri brain results and further management  Heme/Onc history: patient is a 41 year old Hispanic female who is here with her friend.  History obtained with the help of an interpreter.Patient self palpated left breast mass which was followed by a diagnostic bilateral mammogram.  Mammogram showed 3.1 x 2.9 x 1.9 cm hypoechoic mass at the 1 o'clock position of the left breast.  For abnormal cortically thickened left axillary lymph nodes measuring up to 5 mm.  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  Patient was also having ongoing back pain and was seen by [facility] orthopedics Dr. Izola Price who ordered MRI lumbar spine without contrast which showed possible pathologic fractures of L1 and L4 vertebral bodies with greater than 50% height loss at L1 and abnormal signal involving L2-L3 S1 as well as right iliac bone concerning for metastatic disease.  Patient is a single mother of 3 adult children and is very anxious today.  She reports significant back pain which radiates to her bilateral thighs.  Denies any focal tingling numbness or weakness.  Denies any bowel bladder incontinence.  Pain has been uncontrolled despite taking Tylenol.  No prior history of abnormal breast biopsies.  No family history of breast cancer    PET and MRI showed 3 areas of pathologic fracture of her spine as well as widespread bony metastatic disease and concern for impending fracture of the right hip.  Given her worsening pain she was asked to come to the ER.  She has been evaluated by Dr. Rosita Kea from orthopedic surgery and underwent kyphoplasty at 3 different levels. T6 L1 and L4 along with radiofrequency ablation.    She also underwent prophylactic fixation of the right hip and not affected the sacral region.     Patient received first dose of Herceptin and Perjeta on 09/04/2019.  Patient initially received Taxol and Herceptin perjeta.  Presently patient is on maintenance Herceptin and Perjeta alone.  Patient found to have brain mets in November 2022 for which she received whole brain radiation treatment but there was no evidence of systemic progression    Interval history- has occasional scalp pain but denies any headaches or blurry vision. Has chronic right hip pain  ECOG PS- 1 Pain scale- 3   Review of systems- Review of Systems  Constitutional:  Positive for malaise/fatigue. Negative for chills, fever and weight loss.  HENT:  Negative for congestion, ear discharge and nosebleeds.   Eyes:  Negative for blurred vision.  Respiratory:  Negative for cough, hemoptysis, sputum production, shortness of breath and wheezing.   Cardiovascular:  Negative for chest pain, palpitations, orthopnea and claudication.  Gastrointestinal:  Negative for abdominal pain, blood in stool, constipation, diarrhea, heartburn, melena, nausea and vomiting.  Genitourinary:  Negative for dysuria, flank pain, frequency, hematuria and urgency.  Musculoskeletal:  Negative for back pain, joint pain and myalgias.  Right hip pain  Skin:  Negative for rash.  Neurological:  Negative for dizziness, tingling, focal weakness, seizures, weakness and headaches.  Endo/Heme/Allergies:  Does not bruise/bleed easily.  Psychiatric/Behavioral:  Negative for depression  and suicidal ideas. The patient does not have insomnia.       No Known Allergies   Past Medical History:  Diagnosis Date   Anxiety    Breast cancer ([place])    with mets   Cancer ([place])    Colitis    COVID-19 in immunocompromised patient ([place])    Family history of colon cancer    Vertigo      Past Surgical History:  Procedure Laterality Date   BREAST BIOPSY Left 08/14/2019   [place] bx of mass, coil marker, path pending   BREAST BIOPSY Left 08/14/2019   [place] bx of LN, hydromarker, path pending   BREAST BIOPSY Left 08/14/2019   affirm bx of calcs, x marker, path pending   ESOPHAGOGASTRODUODENOSCOPY (EGD) WITH PROPOFOL N/A 10/05/2019   Procedure: ESOPHAGOGASTRODUODENOSCOPY (EGD) WITH PROPOFOL;  Surgeon: Lin Landsman, MD;  Location: [place];  Service: Gastroenterology;  Laterality: N/A;   FLEXIBLE SIGMOIDOSCOPY N/A 10/05/2019   Procedure: FLEXIBLE SIGMOIDOSCOPY;  Surgeon: Lin Landsman, MD;  Location: [facility] ENDOSCOPY;  Service: Gastroenterology;  Laterality: N/A;   INTRAMEDULLARY (IM) NAIL INTERTROCHANTERIC Right 09/01/2019   Procedure: INTRAMEDULLARY (IM) NAIL INTERTROCHANTRIC AND RADIOFREQUENCY ABLATION;  Surgeon: Hessie Knows, MD;  Location: [facility] ORS;  Service: Orthopedics;  Laterality: Right;   KYPHOPLASTY N/A 08/29/2019   Procedure: KYPHOPLASTY T6, L1,L4 ,  RADIOFREQUENCY ABLATION;  Surgeon: Hessie Knows, MD;  Location: [facility] ORS;  Service: Orthopedics;  Laterality: N/A;   KYPHOPLASTY Right 09/01/2019   Procedure: Right Sacral Radiofrequency Ablation and Cement Augmentation, Right sacrum and iliac crest;  Surgeon: Hessie Knows, MD;  Location: [facility] ORS;  Service: Orthopedics;  Laterality: Right;   PORTA CATH INSERTION N/A 08/28/2019   Procedure: PORTA CATH INSERTION;  Surgeon: Algernon Huxley, MD;  Location: [facility];  Service: Cardiovascular;  Laterality: N/A;    Social History   Socioeconomic History   Marital status: Single    Spouse name: Not on file    Number of children: Not on file   Years of education: Not on file   Highest education level: Not on file  Occupational History   Not on file  Tobacco Use   Smoking status: Never   Smokeless tobacco: Never  Vaping Use   Vaping Use: Never used  Substance and Sexual Activity   Alcohol use: Not Currently   Drug use: Not Currently   Sexual activity: Not Currently    Birth control/protection: None  Other Topics Concern   Not on file  Social History Narrative   Lives at home with children   Social Determinants of Health   Financial Resource Strain: Not on file  Food Insecurity: Not on file  Transportation Needs: No Transportation Needs (08/05/2021)   PRAPARE - Hydrologist (Medical): No    Lack of Transportation (Non-Medical): No  Physical Activity: Not on file  Stress: Not on file  Social Connections: Not on file  Intimate Partner Violence: Not on file    Family History  Problem Relation Age of Onset   Colon cancer Maternal Uncle      Current Outpatient Medications:    albuterol (VENTOLIN HFA) 108 (90 Base) MCG/ACT inhaler, Inhale 2 puffs into the lungs every 6 (six) hours as needed for wheezing or shortness  of breath., Disp: 8.5 g, Rfl: 2   Calcium Carb-Cholecalciferol 600-10 MG-MCG TABS, Take 1 tablet by mouth 2 (two) times daily., Disp: 180 tablet, Rfl: 0   DULoxetine (CYMBALTA) 60 MG capsule, Take 1 capsule (60 mg total) by mouth daily., Disp: 30 capsule, Rfl: 3   gabapentin (NEURONTIN) 600 MG tablet, Take 1 tablet (600 mg total) by mouth 3 (three) times daily., Disp: 90 tablet, Rfl: 3   lidocaine-prilocaine (EMLA) cream, Apply 1 application topically as needed. Apply small amount to port site at least 1 hour prior to it being accessed, cover with plastic wrap, Disp: 30 g, Rfl: 3   morphine (MS CONTIN) 15 MG 12 hr tablet, Take 1 tablet (15 mg total) by mouth every 12 (twelve) hours., Disp: 60 tablet, Rfl: 0   ondansetron (ZOFRAN) 8 MG tablet,  Take 1 tablet (8 mg total) by mouth every 8 (eight) hours as needed for nausea or vomiting., Disp: 45 tablet, Rfl: 2   Oxycodone HCl 10 MG TABS, Take 1/2 to 1 tablet (5-10 mg total) by mouth every 4 (four) hours as needed., Disp: 45 tablet, Rfl: 0   polyethylene glycol powder (MIRALAX) 17 GM/SCOOP powder, Take 17 g by mouth daily as needed., Disp: 238 g, Rfl: 3   benzoyl peroxide-erythromycin (BENZAMYCIN) gel, Apply topically 2 (two) times daily. (Patient not taking: Reported on 09/09/2021), Disp: 46.6 g, Rfl: 0   butalbital-acetaminophen-caffeine (FIORICET) 50-325-40 MG tablet, Take 1-2 tablets by mouth every 8 (eight) hours as needed for headache. (Patient not taking: Reported on 09/09/2021), Disp: 30 tablet, Rfl: 0   capecitabine (XELODA) 500 MG tablet, Take 3 tablets (1,500 mg total) by mouth 2 (two) times daily after a meal. Take for 14 days, then hold for 7 days. Repeat every 21 days., Disp: 84 tablet, Rfl: 1   diphenoxylate-atropine (LOMOTIL) 2.5-0.025 MG tablet, Take 1 tablet by mouth 4 (four) times daily as needed for diarrhea or loose stools. (Patient not taking: Reported on 11/19/2020), Disp: 40 tablet, Rfl: 0   LORazepam (ATIVAN) 0.5 MG tablet, Take 1 tablet (0.5 mg total) by mouth every 6 (six) hours as needed for anxiety. AS NEEDED FOR NAUSEA (Patient not taking: Reported on 08/05/2021), Disp: 45 tablet, Rfl: 0   naloxone (NARCAN) nasal spray 4 mg/0.1 mL, Administer one spray in nostril for opioid overdose. If unresponsive after 1 dose, may repeat a dose every 2-3 minutes in alternating nostrils until emergency services arrive., Disp: 2 each, Rfl: 3   OLANZapine (ZYPREXA) 10 MG tablet, Take 1 tablet (10 mg total) by mouth at bedtime as needed (nausea). (Patient not taking: Reported on 06/24/2021), Disp: 30 tablet, Rfl: 2   tucatinib (TUKYSA) 150 MG tablet, Take 2 tablets (300 mg total) by mouth 2 (two) times daily., Disp: 120 tablet, Rfl: 1 No current facility-administered medications for this  visit.  Facility-Administered Medications Ordered in Other Visits:    0.9 %  sodium chloride infusion, , Intravenous, Continuous, Burns, Wandra Feinstein, NP, Stopped at 06/02/20 1453   heparin lock flush 100 unit/mL, 500 Units, Intravenous, Once, Sindy Guadeloupe, MD   heparin lock flush 100 unit/mL, 500 Units, Intravenous, Once, Sindy Guadeloupe, MD   sodium chloride flush (NS) 0.9 % injection 10 mL, 10 mL, Intravenous, Once, Sindy Guadeloupe, MD   sodium chloride flush (NS) 0.9 % injection 10 mL, 10 mL, Intravenous, Once, Sindy Guadeloupe, MD  Physical exam:  Physical Exam Constitutional:      General: She is not in acute distress.  Cardiovascular:     Rate and Rhythm: Normal rate and regular rhythm.     Heart sounds: Normal heart sounds.  Pulmonary:     Effort: Pulmonary effort is normal.     Breath sounds: Normal breath sounds.  Abdominal:     General: Bowel sounds are normal.     Palpations: Abdomen is soft.  Skin:    General: Skin is warm and dry.  Neurological:     Mental Status: She is alert and oriented to person, place, and time.         Latest Ref Rng & Units 09/16/2021    9:16 AM  CMP  Glucose 70 - 99 mg/dL 129   BUN 6 - 20 mg/dL 10   Creatinine 0.44 - 1.00 mg/dL 0.46   Sodium 135 - 145 mmol/L 137   Potassium 3.5 - 5.1 mmol/L 3.6   Chloride 98 - 111 mmol/L 104   CO2 22 - 32 mmol/L 25   Calcium 8.9 - 10.3 mg/dL 8.7   Total Protein 6.5 - 8.1 g/dL 7.5   Total Bilirubin 0.3 - 1.2 mg/dL 0.7   Alkaline Phos 38 - 126 U/L 71   AST 15 - 41 U/L 26   ALT 0 - 44 U/L 17       Latest Ref Rng & Units 09/16/2021    9:16 AM  CBC  WBC 4.0 - 10.5 K/uL 5.9   Hemoglobin 12.0 - 15.0 g/dL 13.3   Hematocrit 36.0 - 46.0 % 40.1   Platelets 150 - 400 K/uL 350     No images are attached to the encounter.  MR Brain W Wo Contrast  Result Date: 09/05/2021 CLINICAL DATA:  History of metastatic breast cancer, abnormal CT EXAM: MRI HEAD WITHOUT AND WITH CONTRAST TECHNIQUE: Multiplanar,  multiecho pulse sequences of the brain and surrounding structures were obtained without and with intravenous contrast. CONTRAST:  10mL GADAVIST GADOBUTROL 1 MMOL/ML IV SOLN COMPARISON:  Brain MRI 05/11/2021 FINDINGS: Brain: Numerous enhancing metastatic lesions are again seen. *A punctate lesion in the right caudate head was not definitely seen on the prior study, though this may be due to difference in technique as black blood imaging was not performed on the prior study (1026-74). Larger lesions: *1.0 cm by 0.7 cm lesion in the left frontal operculum is slightly increased in size when measured in the coronal plane, previously measured 0.7 cm x 0.5 cm (19-29, 1026-77). Surrounding FLAIR signal abnormality is slightly increased. *1.5 cm x 1.2 cm lesion in the right frontal operculum, slightly increased in size from 1.1 cm x 0.7 cm when measured in the coronal plane (19-27, 1026-84). Surrounding FLAIR signal abnormality is slightly increased. There is associated intralesional blood, also present on the prior study. *7 mm x 6 mm lesion in the left cerebellar hemisphere is slightly increased in size from 6 mm x 4 mm (1026-26). Smaller lesions: *4 mm lesion in the right occipital lobe appears slightly decreased in size and conspicuity (1026-54). Stable lesions: *5 mm lesion in the left parietal lobe near the vertex, unchanged (9166-060). *8 mm lesion in the right frontal lobe near the vertex, unchanged (0459-977). *3 mm lesion in the left middle frontal gyrus, not significantly changed (1026-110). *Ill-defined enhancement in the left parietal cortex measuring approximately 4-5 mm, not significantly changed (1026-105). *4 mm lesion in the left frontal lobe, not significantly changed (1026-97). *Cortical enhancement in the left inferior parietal lobe measuring up to 6 mm, unchanged (1026-88). *3 mm lesion in the  right posterior temporal lobe, not significantly changed (1026-55). *6 mm x 5 mm lesion in the right cerebellar  hemisphere, not significantly changed (1026-26). *3 mm lesion in the left cerebellum inferiorly, not significantly changed (1026-14). *3 mm lesion in the right lateral cerebellum, not significantly changed (1026-24). There is no acute intracranial hemorrhage, extra-axial fluid collection, or acute infarct. Parenchymal volume is stable. The ventricles and stable in size. There is no midline shift. Vascular: Normal flow voids. Skull and upper cervical spine: The 7 mm enhancing lesion in the left frontal calvarium is not significantly changed. No new calvarial lesions are seen. Sinuses/Orbits: The paranasal sinuses are clear. The globes and orbits are unremarkable. Other: None. IMPRESSION: 1. Newly apparent punctate lesion in the right caudate head not seen on the study from 05/10/2021, though note that black blood imaging was not performed on that study. No other new lesions identified. 2. Slight interval increase in size of 3 metastatic lesions in the bilateral frontal lobes and left cerebellar hemisphere. A single lesion in the right occipital lobe appears slightly decreased in size, while the remaining lesions are not significantly changed. 3. Unchanged enhancing lesion in the left frontal calvarium. Electronically Signed   By: Valetta Mole M.D.   On: 09/05/2021 09:05     Assessment and plan- Patient is a 41 y.o. female with metastatic er negative her 2 positive breast cancer with bone and brain mets here to discuss mri brain results and further management  Patient had new onset brain mets noted in November 2022 left of which she received whole brain radiation treatment.  There was improvement in her brain mets based on her scan in February 2023 without any evidence of systemic progression and therefore Herceptin and Perjeta was continued.  Her present MRI in June 2023 shows mild increase in the size of 4 brain metastases by a few millimeters.  Rest of the lesions are stable in size and his scans from May 2023  did not reveal any systemic progression.  I will plan to continue Herceptin and Perjeta for her next cycle and obtain systemic scans in August 2023.  I will plan to get an early MRI brain and if there is continued progression of her brain mets in August 2023 I will consider switching her to Xeloda or trastuzumab tucatinib regimen.  I will speak to our oral pharmacy team to start working on this regimen for getting approved since patient does not have any health insurance.  If there is any evidence of systemic progression or progression in her brain based on scans in August 2023 I will switch her with a new regimen since it has a better CNS penetration as compared to Kadcyla.  I will see her next week for her following cycle of Herceptin and Perjeta.  She will also need an echocardiogram in August 2023   Visit Diagnosis 1. Primary malignant neoplasm of breast with metastasis ([place])   2. 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 Randa Evens, MD, MPH [facility] [phone] 09/18/2021 5:57 PM

## 2021-09-19 NOTE — Telephone Encounter (Signed)
Oral Oncology Patient Advocate Encounter  Received notification from [place] that patient has been successfully enrolled into their program to receive Tukysa from the manufacturer at $0 out of pocket until 09/19/22.    I called and spoke with patient via interpreter.   Specialty Pharmacy that will dispense medication is [facility] [phone]).  Patient knows to call the office with questions or concerns.   Oral Oncology Clinic will continue to follow.  [facility] [place] Phone [phone] Fax [phone] 09/19/2021 2:18 PM

## 2021-09-22 ENCOUNTER — Other Ambulatory Visit (HOSPITAL_COMMUNITY): Payer: Self-pay

## 2021-09-22 ENCOUNTER — Other Ambulatory Visit: Payer: Self-pay

## 2021-09-22 ENCOUNTER — Other Ambulatory Visit: Payer: Self-pay | Admitting: Pharmacist

## 2021-09-22 DIAGNOSIS — C50919 Malignant neoplasm of unspecified site of unspecified female breast: Secondary | ICD-10-CM

## 2021-09-22 MED ORDER — CAPECITABINE 500 MG PO TABS
1500.0000 mg | ORAL_TABLET | Freq: Two times a day (BID) | ORAL | 1 refills | Status: DC
  Filled 2021-09-22 – 2021-09-23 (×2): qty 84, 14d supply, fill #0
  Filled 2021-10-07 (×2): qty 84, 21d supply, fill #1

## 2021-09-23 ENCOUNTER — Other Ambulatory Visit: Payer: Self-pay

## 2021-09-23 ENCOUNTER — Other Ambulatory Visit (HOSPITAL_COMMUNITY): Payer: Self-pay

## 2021-09-23 ENCOUNTER — Encounter: Payer: Self-pay | Admitting: Oncology

## 2021-09-23 MED ORDER — MORPHINE SULFATE ER 15 MG PO TBCR
15.0000 mg | EXTENDED_RELEASE_TABLET | Freq: Two times a day (BID) | ORAL | 0 refills | Status: DC
  Filled 2021-09-23: qty 60, 30d supply, fill #0

## 2021-09-23 MED ORDER — OXYCODONE HCL 10 MG PO TABS
5.0000 mg | ORAL_TABLET | ORAL | 0 refills | Status: DC | PRN
  Filled 2021-09-23: qty 45, 8d supply, fill #0

## 2021-09-24 ENCOUNTER — Other Ambulatory Visit (HOSPITAL_COMMUNITY): Payer: Self-pay

## 2021-09-26 ENCOUNTER — Encounter: Payer: Self-pay | Admitting: Oncology

## 2021-09-26 ENCOUNTER — Other Ambulatory Visit (HOSPITAL_COMMUNITY): Payer: Self-pay

## 2021-09-26 ENCOUNTER — Other Ambulatory Visit: Payer: Self-pay

## 2021-10-06 ENCOUNTER — Other Ambulatory Visit (HOSPITAL_COMMUNITY): Payer: Self-pay

## 2021-10-06 ENCOUNTER — Telehealth: Payer: Self-pay | Admitting: Pharmacist

## 2021-10-06 NOTE — Telephone Encounter (Signed)
Oral Chemotherapy Pharmacist Encounter   Called Ms. Townsend Roger with an interpretor on the line. She is to start the taking her tucatinib and capecitabine tomorrow 10/06/21.  Reminded her how to take her medication:   Xeloda: Take 3 tablets (1,500 mg total) by mouth 2 (two) times daily after a meal. Take for 14 days, then hold for 7 days. Repeat every 21 days. Laury Axon: Take 2 tablets (300 mg total) by mouth 2 (two) times daily.  She stated her understanding of how to take the medications. She has her capectiabine calendar in hand to help her keep track of the capecitabine scheduled.  Darl Pikes, PharmD, BCPS, BCOP, CPP Hematology/Oncology Clinical Pharmacist [place]/DB/AP Oral [facility] [phone]  10/06/2021 4:13 PM

## 2021-10-07 ENCOUNTER — Inpatient Hospital Stay: Payer: Self-pay | Attending: Oncology

## 2021-10-07 ENCOUNTER — Other Ambulatory Visit: Payer: Self-pay

## 2021-10-07 ENCOUNTER — Ambulatory Visit: Payer: Self-pay

## 2021-10-07 ENCOUNTER — Inpatient Hospital Stay: Payer: Self-pay

## 2021-10-07 ENCOUNTER — Encounter: Payer: Self-pay | Admitting: Oncology

## 2021-10-07 ENCOUNTER — Inpatient Hospital Stay (HOSPITAL_BASED_OUTPATIENT_CLINIC_OR_DEPARTMENT_OTHER): Payer: Self-pay | Admitting: Oncology

## 2021-10-07 ENCOUNTER — Other Ambulatory Visit (HOSPITAL_COMMUNITY): Payer: Self-pay

## 2021-10-07 VITALS — BP 112/80 | HR 80 | Temp 96.5°F | Resp 16 | Wt 145.6 lb

## 2021-10-07 VITALS — BP 117/76 | HR 59

## 2021-10-07 DIAGNOSIS — C50911 Malignant neoplasm of unspecified site of right female breast: Secondary | ICD-10-CM

## 2021-10-07 DIAGNOSIS — Z5112 Encounter for antineoplastic immunotherapy: Secondary | ICD-10-CM | POA: Insufficient documentation

## 2021-10-07 DIAGNOSIS — R112 Nausea with vomiting, unspecified: Secondary | ICD-10-CM | POA: Insufficient documentation

## 2021-10-07 DIAGNOSIS — C50412 Malignant neoplasm of upper-outer quadrant of left female breast: Secondary | ICD-10-CM | POA: Insufficient documentation

## 2021-10-07 DIAGNOSIS — C7931 Secondary malignant neoplasm of brain: Secondary | ICD-10-CM

## 2021-10-07 DIAGNOSIS — Z171 Estrogen receptor negative status [ER-]: Secondary | ICD-10-CM | POA: Insufficient documentation

## 2021-10-07 DIAGNOSIS — C50912 Malignant neoplasm of unspecified site of left female breast: Secondary | ICD-10-CM

## 2021-10-07 DIAGNOSIS — C7951 Secondary malignant neoplasm of bone: Secondary | ICD-10-CM | POA: Insufficient documentation

## 2021-10-07 DIAGNOSIS — M8440XA Pathological fracture, unspecified site, initial encounter for fracture: Secondary | ICD-10-CM

## 2021-10-07 DIAGNOSIS — G893 Neoplasm related pain (acute) (chronic): Secondary | ICD-10-CM | POA: Insufficient documentation

## 2021-10-07 DIAGNOSIS — R609 Edema, unspecified: Secondary | ICD-10-CM | POA: Insufficient documentation

## 2021-10-07 DIAGNOSIS — E876 Hypokalemia: Secondary | ICD-10-CM | POA: Insufficient documentation

## 2021-10-07 DIAGNOSIS — Z800 Family history of malignant neoplasm of digestive organs: Secondary | ICD-10-CM | POA: Insufficient documentation

## 2021-10-07 DIAGNOSIS — Z79899 Other long term (current) drug therapy: Secondary | ICD-10-CM

## 2021-10-07 DIAGNOSIS — C50919 Malignant neoplasm of unspecified site of unspecified female breast: Secondary | ICD-10-CM

## 2021-10-07 LAB — COMPREHENSIVE METABOLIC PANEL
ALT: 23 U/L (ref 0–44)
AST: 29 U/L (ref 15–41)
Albumin: 3.6 g/dL (ref 3.5–5.0)
Alkaline Phosphatase: 74 U/L (ref 38–126)
Anion gap: 6 (ref 5–15)
BUN: 11 mg/dL (ref 6–20)
CO2: 23 mmol/L (ref 22–32)
Calcium: 8.8 mg/dL — ABNORMAL LOW (ref 8.9–10.3)
Chloride: 109 mmol/L (ref 98–111)
Creatinine, Ser: 0.56 mg/dL (ref 0.44–1.00)
GFR, Estimated: >60 mL/min (ref >60)
Glucose, Bld: 157 mg/dL — ABNORMAL HIGH (ref 70–99)
Potassium: 3.4 mmol/L — ABNORMAL LOW (ref 3.5–5.1)
Sodium: 138 mmol/L (ref 135–145)
Total Bilirubin: 0.7 mg/dL (ref 0.3–1.2)
Total Protein: 7.2 g/dL (ref 6.5–8.1)

## 2021-10-07 LAB — CBC WITH DIFFERENTIAL/PLATELET
Abs Immature Granulocytes: 0.03 10*3/uL (ref 0.00–0.07)
Basophils Absolute: 0 10*3/uL (ref 0.0–0.1)
Basophils Relative: 1 %
Eosinophils Absolute: 0.1 10*3/uL (ref 0.0–0.5)
Eosinophils Relative: 2 %
HCT: 38.7 % (ref 36.0–46.0)
Hemoglobin: 12.9 g/dL (ref 12.0–15.0)
Immature Granulocytes: 1 %
Lymphocytes Relative: 31 %
Lymphs Abs: 1.5 10*3/uL (ref 0.7–4.0)
MCH: 28.8 pg (ref 26.0–34.0)
MCHC: 33.3 g/dL (ref 30.0–36.0)
MCV: 86.4 fL (ref 80.0–100.0)
Monocytes Absolute: 0.3 10*3/uL (ref 0.1–1.0)
Monocytes Relative: 5 %
Neutro Abs: 3.1 10*3/uL (ref 1.7–7.7)
Neutrophils Relative %: 60 %
Platelets: 281 10*3/uL (ref 150–400)
RBC: 4.48 MIL/uL (ref 3.87–5.11)
RDW: 13.4 % (ref 11.5–15.5)
WBC: 5 10*3/uL (ref 4.0–10.5)
nRBC: 0 % (ref 0.0–0.2)

## 2021-10-07 MED ORDER — ACETAMINOPHEN 325 MG PO TABS
650.0000 mg | ORAL_TABLET | Freq: Once | ORAL | Status: AC
  Administered 2021-10-07: 650 mg via ORAL
  Filled 2021-10-07: qty 2

## 2021-10-07 MED ORDER — OXYCODONE HCL 10 MG PO TABS
5.0000 mg | ORAL_TABLET | ORAL | 0 refills | Status: DC | PRN
  Filled 2021-10-07: qty 45, 8d supply, fill #0

## 2021-10-07 MED ORDER — TRASTUZUMAB-ANNS CHEMO 150 MG IV SOLR
6.0000 mg/kg | Freq: Once | INTRAVENOUS | Status: AC
  Administered 2021-10-07: 399 mg via INTRAVENOUS
  Filled 2021-10-07: qty 19

## 2021-10-07 MED ORDER — DIPHENOXYLATE-ATROPINE 2.5-0.025 MG PO TABS
1.0000 | ORAL_TABLET | Freq: Four times a day (QID) | ORAL | 0 refills | Status: DC | PRN
  Filled 2021-10-07: qty 40, 10d supply, fill #0

## 2021-10-07 MED ORDER — DIPHENOXYLATE-ATROPINE 2.5-0.025 MG PO TABS: 1.0000 | ORAL_TABLET | Freq: Four times a day (QID) | ORAL | 0 refills | Status: DC | PRN

## 2021-10-07 MED ORDER — HEPARIN SOD (PORK) LOCK FLUSH 100 UNIT/ML IV SOLN
500.0000 [IU] | Freq: Once | INTRAVENOUS | Status: AC | PRN
  Administered 2021-10-07: 500 [IU]
  Filled 2021-10-07: qty 5

## 2021-10-07 MED ORDER — DIPHENHYDRAMINE HCL 50 MG/ML IJ SOLN
50.0000 mg | Freq: Once | INTRAMUSCULAR | Status: AC
  Administered 2021-10-07: 50 mg via INTRAVENOUS
  Filled 2021-10-07: qty 1

## 2021-10-07 MED ORDER — SODIUM CHLORIDE 0.9 % IV SOLN
Freq: Once | INTRAVENOUS | Status: AC
  Filled 2021-10-07: qty 250

## 2021-10-07 NOTE — Patient Instructions (Signed)
[facility]  Discharge Instructions: Thank you for choosing [facility] to provide your oncology and hematology care.   If you have a lab appointment with the Cancer Center, please go directly to the Cancer Center and check in at the registration area.   Wear comfortable clothing and clothing appropriate for easy access to any Portacath or PICC line.   We strive to give you quality time with your provider. You may need to reschedule your appointment if you arrive late (15 or more minutes).  Arriving late affects you and other patients whose appointments are after yours.  Also, if you miss three or more appointments without notifying the office, you may be dismissed from the clinic at the provider's discretion.      For prescription refill requests, have your pharmacy contact our office and allow 72 hours for refills to be completed.    Today you received the following chemotherapy and/or immunotherapy agents       To help prevent nausea and vomiting after your treatment, we encourage you to take your nausea medication as directed.  BELOW ARE SYMPTOMS THAT SHOULD BE REPORTED IMMEDIATELY: *FEVER GREATER THAN 100.4 F (38 C) OR HIGHER *CHILLS OR SWEATING *NAUSEA AND VOMITING THAT IS NOT CONTROLLED WITH YOUR NAUSEA MEDICATION *UNUSUAL SHORTNESS OF BREATH *UNUSUAL BRUISING OR BLEEDING *URINARY PROBLEMS (pain or burning when urinating, or frequent urination) *BOWEL PROBLEMS (unusual diarrhea, constipation, pain near the anus) TENDERNESS IN MOUTH AND THROAT WITH OR WITHOUT PRESENCE OF ULCERS (sore throat, sores in mouth, or a toothache) UNUSUAL RASH, SWELLING OR PAIN  UNUSUAL VAGINAL DISCHARGE OR ITCHING   Items with * indicate a potential emergency and should be followed up as soon as possible or go to the Emergency Department if any problems should occur.  Please show the CHEMOTHERAPY ALERT CARD or IMMUNOTHERAPY ALERT CARD at check-in to the  Emergency Department and triage nurse.  Should you have questions after your visit or need to cancel or reschedule your appointment, please contact [facility]  Dept: [phone]  and follow the prompts.  Office hours are 8:00 a.m. to 4:30 p.m. Monday - Friday. Please note that voicemails left after 4:00 p.m. may not be returned until the following business day.  We are closed weekends and major holidays. You have access to a nurse at all times for urgent questions. Please call the main number to the clinic Dept: [phone] and follow the prompts.   For any non-urgent questions, you may also contact your provider using MyChart. We now offer e-Visits for anyone 18 and older to request care online for non-urgent symptoms. For details visit [email or web address].   Also download the MyChart app! Go to the app store, search "MyChart", open the app, select [facility], and log in with your MyChart username and password.  Masks are optional in the cancer centers. If you would like for your care team to wear a mask while they are taking care of you, please let them know. For doctor visits, patients may have with them one support person who is at least 43 years old. At this time, visitors are not allowed in the infusion area. 

## 2021-10-07 NOTE — Progress Notes (Signed)
Hematology/Oncology Consult note [facility]  [phone] [phone]  Patient Care Team: Sindy Guadeloupe, MD as PCP - General (Oncology) Rico Junker, RN as Registered Nurse Theodore Demark, RN (Inactive) as Registered Nurse Sindy Guadeloupe, MD as Consulting Physician (Hematology and Oncology)   Name of the patient: Sophia [name]  [id number]  04/05/1980   Date of visit: 10/07/21  Diagnosis-  stage IV metastatic breast cancer ER/PR negative HER-2/neu positive with bone metastases  Chief complaint/ Reason for visit-on treatment assessment prior to next cycle of Herceptin and start of tucatinib and Xeloda  Heme/Onc history: patient is a 6 year old Hispanic female who is here with her friend.  History obtained with the help of an interpreter.Patient self palpated left breast mass which was followed by a diagnostic bilateral mammogram.  Mammogram showed 3.1 x 2.9 x 1.9 cm hypoechoic mass at the 1 o'clock position of the left breast.  For abnormal cortically thickened left axillary lymph nodes measuring up to 5 mm.  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  Patient was also having ongoing back pain and was seen by [facility] orthopedics Dr. Doyle Askew who ordered MRI lumbar spine without contrast which showed possible pathologic fractures of L1 and L4 vertebral bodies with greater than 50% height loss at L1 and abnormal signal involving L2-L3 S1 as well as right iliac bone concerning for metastatic disease.  Patient is a single mother of 3 adult children and is very anxious today.  She reports significant back pain which radiates to her bilateral thighs.  Denies any focal tingling numbness or weakness.  Denies any bowel bladder incontinence.  Pain has been uncontrolled despite taking Tylenol.  No prior history of abnormal breast biopsies.   No family history of breast cancer   PET and MRI showed 3 areas of pathologic fracture of her spine as well as widespread bony metastatic disease and concern for impending fracture of the right hip.  Given her worsening pain she was asked to come to the ER.  She has been evaluated by Dr. Rudene Christians from orthopedic surgery and underwent kyphoplasty at 3 different levels. T6 L1 and L4 along with radiofrequency ablation.    She also underwent prophylactic fixation of the right hip and not affected the sacral region.     Patient received first dose of Herceptin and Perjeta on 09/04/2019.  Patient initially received Taxol and Herceptin perjeta.  Presently patient is on maintenance Herceptin and Perjeta alone.  Patient found to have brain mets in November 2022 for which she received whole brain radiation treatment but there was no evidence of systemic progression.  She was continued on Herceptin and Perjeta.  Repeat MRI in June 2023 showed slight progression of brain mets and plan is to switch her to Herceptin tucatinib Xeloda    Interval history-patient reports occasional heaviness and burning sensation in her right ankle.  Has baseline right hip pain.  Denies any new complaints at this time  ECOG PS- 1 Pain scale- 3 Opioid associated constipation- no  Review of systems- Review of Systems  Constitutional:  Negative for chills, fever, malaise/fatigue and weight loss.  HENT:  Negative for congestion, ear discharge and nosebleeds.   Eyes:  Negative for blurred vision.  Respiratory:  Negative for cough, hemoptysis, sputum production, shortness of breath and wheezing.   Cardiovascular:  Negative  for chest pain, palpitations, orthopnea and claudication.  Gastrointestinal:  Negative for abdominal pain, blood in stool, constipation, diarrhea, heartburn, melena, nausea and vomiting.  Genitourinary:  Negative for dysuria, flank pain, frequency, hematuria and urgency.  Musculoskeletal:  Negative for back pain, joint  pain and myalgias.       Right hip and ankle pain  Skin:  Negative for rash.  Neurological:  Negative for dizziness, tingling, focal weakness, seizures, weakness and headaches.  Endo/Heme/Allergies:  Does not bruise/bleed easily.  Psychiatric/Behavioral:  Negative for depression and suicidal ideas. The patient does not have insomnia.       No Known Allergies   Past Medical History:  Diagnosis Date   Anxiety    Breast cancer ([place])    with mets   Cancer ([place])    Colitis    COVID-19 in immunocompromised patient ([place])    Family history of colon cancer    Vertigo      Past Surgical History:  Procedure Laterality Date   BREAST BIOPSY Left 08/14/2019   [place] bx of mass, coil marker, path pending   BREAST BIOPSY Left 08/14/2019   [place] bx of LN, hydromarker, path pending   BREAST BIOPSY Left 08/14/2019   affirm bx of calcs, x marker, path pending   ESOPHAGOGASTRODUODENOSCOPY (EGD) WITH PROPOFOL N/A 10/05/2019   Procedure: ESOPHAGOGASTRODUODENOSCOPY (EGD) WITH PROPOFOL;  Surgeon: Lin Landsman, MD;  Location: [place];  Service: Gastroenterology;  Laterality: N/A;   FLEXIBLE SIGMOIDOSCOPY N/A 10/05/2019   Procedure: FLEXIBLE SIGMOIDOSCOPY;  Surgeon: Lin Landsman, MD;  Location: [facility] ENDOSCOPY;  Service: Gastroenterology;  Laterality: N/A;   INTRAMEDULLARY (IM) NAIL INTERTROCHANTERIC Right 09/01/2019   Procedure: INTRAMEDULLARY (IM) NAIL INTERTROCHANTRIC AND RADIOFREQUENCY ABLATION;  Surgeon: Hessie Knows, MD;  Location: [facility] ORS;  Service: Orthopedics;  Laterality: Right;   KYPHOPLASTY N/A 08/29/2019   Procedure: KYPHOPLASTY T6, L1,L4 ,  RADIOFREQUENCY ABLATION;  Surgeon: Hessie Knows, MD;  Location: [facility] ORS;  Service: Orthopedics;  Laterality: N/A;   KYPHOPLASTY Right 09/01/2019   Procedure: Right Sacral Radiofrequency Ablation and Cement Augmentation, Right sacrum and iliac crest;  Surgeon: Hessie Knows, MD;  Location: [facility] ORS;  Service: Orthopedics;  Laterality:  Right;   PORTA CATH INSERTION N/A 08/28/2019   Procedure: PORTA CATH INSERTION;  Surgeon: Algernon Huxley, MD;  Location: [place] CV LAB;  Service: Cardiovascular;  Laterality: N/A;    Social History   Socioeconomic History   Marital status: Single    Spouse name: Not on file   Number of children: Not on file   Years of education: Not on file   Highest education level: Not on file  Occupational History   Not on file  Tobacco Use   Smoking status: Never   Smokeless tobacco: Never  Vaping Use   Vaping Use: Never used  Substance and Sexual Activity   Alcohol use: Not Currently   Drug use: Not Currently   Sexual activity: Not Currently    Birth control/protection: None  Other Topics Concern   Not on file  Social History Narrative   Lives at home with children   Social Determinants of Health   Financial Resource Strain: Not on file  Food Insecurity: Not on file  Transportation Needs: No Transportation Needs (08/05/2021)   PRAPARE - Hydrologist (Medical): No    Lack of Transportation (Non-Medical): No  Physical Activity: Not on file  Stress: Not on file  Social Connections: Not on file  Intimate Partner Violence:  Not on file    Family History  Problem Relation Age of Onset   Colon cancer Maternal Uncle      Current Outpatient Medications:    albuterol (VENTOLIN HFA) 108 (90 Base) MCG/ACT inhaler, Inhale 2 puffs into the lungs every 6 (six) hours as needed for wheezing or shortness of breath., Disp: 8.5 g, Rfl: 2   Calcium Carb-Cholecalciferol 600-10 MG-MCG TABS, Take 1 tablet by mouth 2 (two) times daily., Disp: 180 tablet, Rfl: 0   capecitabine (XELODA) 500 MG tablet, Take 3 tablets (1,500 mg total) by mouth 2 (two) times daily after a meal. Take for 14 days, then hold for 7 days. Repeat every 21 days., Disp: 84 tablet, Rfl: 1   DULoxetine (CYMBALTA) 60 MG capsule, Take 1 capsule (60 mg total) by mouth daily., Disp: 30 capsule, Rfl: 3    gabapentin (NEURONTIN) 600 MG tablet, Take 1 tablet (600 mg total) by mouth 3 (three) times daily., Disp: 90 tablet, Rfl: 3   lidocaine-prilocaine (EMLA) cream, Apply 1 application topically as needed. Apply small amount to port site at least 1 hour prior to it being accessed, cover with plastic wrap, Disp: 30 g, Rfl: 3   morphine (MS CONTIN) 15 MG 12 hr tablet, Take 1 tablet (15 mg total) by mouth every 12 (twelve) hours., Disp: 60 tablet, Rfl: 0   ondansetron (ZOFRAN) 8 MG tablet, Take 1 tablet (8 mg total) by mouth every 8 (eight) hours as needed for nausea or vomiting., Disp: 45 tablet, Rfl: 2   Oxycodone HCl 10 MG TABS, Take 1/2 to 1 tablet (5-10 mg total) by mouth every 4 (four) hours as needed., Disp: 45 tablet, Rfl: 0   polyethylene glycol powder (MIRALAX) 17 GM/SCOOP powder, Take 17 g by mouth daily as needed., Disp: 238 g, Rfl: 3   tucatinib (TUKYSA) 150 MG tablet, Take 2 tablets (300 mg total) by mouth 2 (two) times daily., Disp: 120 tablet, Rfl: 1   benzoyl peroxide-erythromycin (BENZAMYCIN) gel, Apply topically 2 (two) times daily. (Patient not taking: Reported on 09/09/2021), Disp: 46.6 g, Rfl: 0   butalbital-acetaminophen-caffeine (FIORICET) 50-325-40 MG tablet, Take 1-2 tablets by mouth every 8 (eight) hours as needed for headache. (Patient not taking: Reported on 09/09/2021), Disp: 30 tablet, Rfl: 0   diphenoxylate-atropine (LOMOTIL) 2.5-0.025 MG tablet, Take 1 tablet by mouth 4 (four) times daily as needed for diarrhea or loose stools., Disp: 40 tablet, Rfl: 0   LORazepam (ATIVAN) 0.5 MG tablet, Take 1 tablet (0.5 mg total) by mouth every 6 (six) hours as needed for anxiety. AS NEEDED FOR NAUSEA (Patient not taking: Reported on 08/05/2021), Disp: 45 tablet, Rfl: 0   naloxone (NARCAN) nasal spray 4 mg/0.1 mL, Administer one spray in nostril for opioid overdose. If unresponsive after 1 dose, may repeat a dose every 2-3 minutes in alternating nostrils until emergency services arrive. (Patient  not taking: Reported on 10/07/2021), Disp: 2 each, Rfl: 3   OLANZapine (ZYPREXA) 10 MG tablet, Take 1 tablet (10 mg total) by mouth at bedtime as needed (nausea). (Patient not taking: Reported on 06/24/2021), Disp: 30 tablet, Rfl: 2 No current facility-administered medications for this visit.  Facility-Administered Medications Ordered in Other Visits:    0.9 %  sodium chloride infusion, , Intravenous, Continuous, Burns, Renda Rolls, NP, Stopped at 06/02/20 1453   heparin lock flush 100 unit/mL, 500 Units, Intravenous, Once, Creig Hines, MD   heparin lock flush 100 unit/mL, 500 Units, Intravenous, Once, Creig Hines, MD  sodium chloride flush (NS) 0.9 % injection 10 mL, 10 mL, Intravenous, Once, Sindy Guadeloupe, MD   sodium chloride flush (NS) 0.9 % injection 10 mL, 10 mL, Intravenous, Once, Sindy Guadeloupe, MD  Physical exam:  Vitals:   10/07/21 1019  BP: 112/80  Pulse: 80  Resp: 16  Temp: (!) 96.5 F (35.8 C)  SpO2: 98%  Weight: 145 lb 9.6 oz (66 kg)   Physical Exam Cardiovascular:     Rate and Rhythm: Normal rate and regular rhythm.     Heart sounds: Normal heart sounds.  Pulmonary:     Effort: Pulmonary effort is normal.     Breath sounds: Normal breath sounds.  Skin:    General: Skin is warm and dry.  Neurological:     Mental Status: She is alert and oriented to person, place, and time.         Latest Ref Rng & Units 10/07/2021    9:53 AM  CMP  Glucose 70 - 99 mg/dL 157   BUN 6 - 20 mg/dL 11   Creatinine 0.44 - 1.00 mg/dL 0.56   Sodium 135 - 145 mmol/L 138   Potassium 3.5 - 5.1 mmol/L 3.4   Chloride 98 - 111 mmol/L 109   CO2 22 - 32 mmol/L 23   Calcium 8.9 - 10.3 mg/dL 8.8   Total Protein 6.5 - 8.1 g/dL 7.2   Total Bilirubin 0.3 - 1.2 mg/dL 0.7   Alkaline Phos 38 - 126 U/L 74   AST 15 - 41 U/L 29   ALT 0 - 44 U/L 23       Latest Ref Rng & Units 10/07/2021    9:53 AM  CBC  WBC 4.0 - 10.5 K/uL 5.0   Hemoglobin 12.0 - 15.0 g/dL 12.9   Hematocrit 36.0 - 46.0  % 38.7   Platelets 150 - 400 K/uL 281      Assessment and plan- Patient is a 41 y.o. female with history of  ER negative HER2 positive metastatic breast cancer with bone and brain metastases here for on treatment assessment prior to next cycle of Herceptin  Patient was previously getting Herceptin and Perjeta and now we are switching her to Herceptin alone but with the addition of Xeloda 1500 mg twice daily 2 weeks on and 1 week off which she will start taking today.  She will also start taking tucatinib 300 mg twice daily until progression or toxicity.  She will be seen by covering in NP in 3 weeks for next cycle of Herceptin and see how she is tolerating the triple combination and I will see her back in 6 weeks with scans prior.  She also has an upcoming echocardiogram scheduled  Neoplasm related pain: She has not picked up her morphine long-acting and has been using oxycodone more than usual.  I have asked her to go ahead and pick up both morphine and oxycodone and take medications as prescribed.  Patient verbalized understanding   Visit Diagnosis 1. High risk medication use   2. Encounter for monoclonal antibody treatment for malignancy   3. Primary cancer of left breast with metastasis to other site ([place])   4. 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 Randa Evens, MD, MPH [facility] [phone] 10/07/2021 12:35 PM

## 2021-10-07 NOTE — Progress Notes (Signed)
Pt states that she has not been able to pick up her morphone due to transportation issues so she has been taking oxycodone in palce of the morphine.Also, states she takes more oxycodone due to the pain she feels in her ankles and is becoming more consistent her feet have ben very cold feeling like a ice cube; but deals with hot flashes as well. Feet feel very heavy and hard to lift when she walks.

## 2021-10-14 ENCOUNTER — Encounter: Payer: Self-pay | Admitting: Oncology

## 2021-10-14 ENCOUNTER — Other Ambulatory Visit: Payer: Self-pay | Admitting: *Deleted

## 2021-10-14 ENCOUNTER — Inpatient Hospital Stay (HOSPITAL_BASED_OUTPATIENT_CLINIC_OR_DEPARTMENT_OTHER): Payer: Self-pay | Admitting: Hospice and Palliative Medicine

## 2021-10-14 ENCOUNTER — Inpatient Hospital Stay: Payer: Self-pay

## 2021-10-14 ENCOUNTER — Telehealth: Payer: Self-pay | Admitting: *Deleted

## 2021-10-14 ENCOUNTER — Other Ambulatory Visit: Payer: Self-pay

## 2021-10-14 DIAGNOSIS — Z95828 Presence of other vascular implants and grafts: Secondary | ICD-10-CM

## 2021-10-14 DIAGNOSIS — R110 Nausea: Secondary | ICD-10-CM

## 2021-10-14 DIAGNOSIS — C50912 Malignant neoplasm of unspecified site of left female breast: Secondary | ICD-10-CM

## 2021-10-14 DIAGNOSIS — R109 Unspecified abdominal pain: Secondary | ICD-10-CM

## 2021-10-14 DIAGNOSIS — C7951 Secondary malignant neoplasm of bone: Secondary | ICD-10-CM

## 2021-10-14 DIAGNOSIS — E876 Hypokalemia: Secondary | ICD-10-CM

## 2021-10-14 LAB — CBC WITH DIFFERENTIAL/PLATELET
Abs Immature Granulocytes: 0.02 K/uL (ref 0.00–0.07)
Basophils Absolute: 0 K/uL (ref 0.0–0.1)
Basophils Relative: 1 %
Eosinophils Absolute: 0.1 K/uL (ref 0.0–0.5)
Eosinophils Relative: 1 %
HCT: 38.8 % (ref 36.0–46.0)
Hemoglobin: 13 g/dL (ref 12.0–15.0)
Immature Granulocytes: 0 %
Lymphocytes Relative: 21 %
Lymphs Abs: 1.3 K/uL (ref 0.7–4.0)
MCH: 28.5 pg (ref 26.0–34.0)
MCHC: 33.5 g/dL (ref 30.0–36.0)
MCV: 85.1 fL (ref 80.0–100.0)
Monocytes Absolute: 0.3 K/uL (ref 0.1–1.0)
Monocytes Relative: 4 %
Neutro Abs: 4.3 K/uL (ref 1.7–7.7)
Neutrophils Relative %: 73 %
Platelets: 340 K/uL (ref 150–400)
RBC: 4.56 MIL/uL (ref 3.87–5.11)
RDW: 13.5 % (ref 11.5–15.5)
WBC: 5.9 K/uL (ref 4.0–10.5)
nRBC: 0 % (ref 0.0–0.2)

## 2021-10-14 LAB — COMPREHENSIVE METABOLIC PANEL WITH GFR
ALT: 91 U/L — ABNORMAL HIGH (ref 0–44)
AST: 67 U/L — ABNORMAL HIGH (ref 15–41)
Albumin: 4.2 g/dL (ref 3.5–5.0)
Alkaline Phosphatase: 84 U/L (ref 38–126)
Anion gap: 7 (ref 5–15)
BUN: 8 mg/dL (ref 6–20)
CO2: 24 mmol/L (ref 22–32)
Calcium: 8.3 mg/dL — ABNORMAL LOW (ref 8.9–10.3)
Chloride: 106 mmol/L (ref 98–111)
Creatinine, Ser: 0.7 mg/dL (ref 0.44–1.00)
GFR, Estimated: >60 mL/min
Glucose, Bld: 161 mg/dL — ABNORMAL HIGH (ref 70–99)
Potassium: 3.3 mmol/L — ABNORMAL LOW (ref 3.5–5.1)
Sodium: 137 mmol/L (ref 135–145)
Total Bilirubin: 0.9 mg/dL (ref 0.3–1.2)
Total Protein: 7.7 g/dL (ref 6.5–8.1)

## 2021-10-14 MED ORDER — HEPARIN SOD (PORK) LOCK FLUSH 100 UNIT/ML IV SOLN
500.0000 [IU] | Freq: Once | INTRAVENOUS | Status: AC
  Administered 2021-10-14: 500 [IU] via INTRAVENOUS
  Filled 2021-10-14: qty 5

## 2021-10-14 MED ORDER — OXYCODONE HCL 5 MG PO TABS
5.0000 mg | ORAL_TABLET | Freq: Once | ORAL | Status: AC
  Administered 2021-10-14: 5 mg via ORAL
  Filled 2021-10-14: qty 1

## 2021-10-14 MED ORDER — OLANZAPINE 10 MG PO TABS
10.0000 mg | ORAL_TABLET | Freq: Every evening | ORAL | 2 refills | Status: DC | PRN
  Filled 2021-10-14: qty 30, 30d supply, fill #0
  Filled 2022-03-31: qty 30, 30d supply, fill #1

## 2021-10-14 MED ORDER — OMEPRAZOLE 20 MG PO CPDR
20.0000 mg | DELAYED_RELEASE_CAPSULE | Freq: Every day | ORAL | 3 refills | Status: DC
  Filled 2021-10-14: qty 30, 30d supply, fill #0

## 2021-10-14 MED ORDER — SODIUM CHLORIDE 0.9 % IV SOLN
INTRAVENOUS | Status: AC
  Filled 2021-10-14: qty 250

## 2021-10-14 MED ORDER — GABAPENTIN 600 MG PO TABS
600.0000 mg | ORAL_TABLET | Freq: Three times a day (TID) | ORAL | 3 refills | Status: DC
  Filled 2021-10-14: qty 90, 30d supply, fill #0

## 2021-10-14 MED ORDER — MORPHINE SULFATE ER 15 MG PO TBCR
15.0000 mg | EXTENDED_RELEASE_TABLET | Freq: Three times a day (TID) | ORAL | 0 refills | Status: DC
Start: 2021-10-14 — End: 2021-11-14

## 2021-10-14 MED ORDER — SODIUM CHLORIDE 0.9% FLUSH
10.0000 mL | Freq: Once | INTRAVENOUS | Status: AC
  Administered 2021-10-14: 10 mL via INTRAVENOUS
  Filled 2021-10-14: qty 10

## 2021-10-14 MED ORDER — ONDANSETRON HCL 8 MG PO TABS
8.0000 mg | ORAL_TABLET | Freq: Three times a day (TID) | ORAL | 2 refills | Status: DC | PRN
  Filled 2021-10-14: qty 45, 15d supply, fill #0

## 2021-10-14 MED ORDER — POTASSIUM CHLORIDE 20 MEQ/100ML IV SOLN
20.0000 meq | Freq: Once | INTRAVENOUS | Status: AC
  Administered 2021-10-14: 20 meq via INTRAVENOUS

## 2021-10-14 NOTE — Telephone Encounter (Signed)
Patient called today to get a refill of her gabapentin and also she is having stomach ache pains for the last 2 days and the pain medicine that she takes does not help.  Patient has been vomiting for 2 days now.  To Dr. Janese Banks she said to have symptom management seen.  Spoke to [place] and he will see the patient today at 43 and have lab work through her port and then see [name] and then will go from there.  Patient understands that she can come over at 130 today and will try to take care of her .she is agreeable with the plan

## 2021-10-14 NOTE — Telephone Encounter (Signed)
Pt needs refill of gabapentin but she also says that she has stomach aches and the pain med does not help and she has vomited 2 times in last few days. Not sure what is going on

## 2021-10-14 NOTE — Progress Notes (Signed)
Symptom Management and Supportive Care [facility] at [facility] [phone] [phone]  Patient Care Team: Creig Hines, MD as PCP - General (Oncology) Jim Like, RN as Registered Nurse Scarlett Presto, RN (Inactive) as Registered Nurse Creig Hines, MD as Consulting Physician (Hematology and Oncology)   NAME OF PATIENT: Sophia [name]  [id number]  [birth year]   DATE OF VISIT: 10/14/21  REASON FOR CONSULT: Shalissa Easterwood is a 41 y.o. 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 MRI of the brain on 02/08/2021 revealed widespread metastatic disease to the brain with mild associated edema and small area of hemorrhage in the right frontal.  Patient is status post whole brain radiation.  She completed Taxol and iwas on Herceptin and Perjeta maintenance.  She is now on Herceptin with Xeloda and tucatinib.   INTERVAL HISTORY: Patient last saw [name] on 10/07/2021 at which time she was at baseline.  Patient was seen in clinic today for nausea and vomiting.  Patient reports that since starting Xeloda, she has had intermittent nausea with occasional vomiting triggered by smells/food.  She has had occasional epigastric discomfort not improved with MS Contin/oxycodone.  Patient has been taking ondansetron 2-3 times a day without significant improvement in symptoms.  Of note, she is no longer taking olanzapine at bedtime.  She denies fever.  No diarrhea or constipation.  Denies any neurologic complaints. Denies recent fevers or illnesses. Denies any easy bleeding or bruising. Reports fair appetite and denies weight loss. Denies chest pain.  Denies urinary complaints. Patient offers no further specific complaints today.  SOCIAL HISTORY:     reports that she has never smoked. She has never used smokeless tobacco. She reports that she does not  currently use alcohol. She reports that she does not currently use drugs.  Patient is from [place].  She speaks [place].  She lives here with her 3 children ages 49-16.  She has no family or friend/social support locally.  She is unable to work.  ADVANCE DIRECTIVES:  Does not have  CODE STATUS:    PAST MEDICAL HISTORY: Past Medical History:  Diagnosis Date   Anxiety    Breast cancer (HCC)    with mets   Cancer (HCC)    Colitis    COVID-19 in immunocompromised patient (HCC)    Family history of colon cancer    Vertigo     PAST SURGICAL HISTORY:  Past Surgical History:  Procedure Laterality Date   BREAST BIOPSY Left 08/14/2019   [place] bx of mass, coil marker, path pending   BREAST BIOPSY Left 08/14/2019   [place] bx of LN, hydromarker, path pending   BREAST BIOPSY Left 08/14/2019   affirm bx of calcs, x marker, path pending   ESOPHAGOGASTRODUODENOSCOPY (EGD) WITH PROPOFOL N/A 10/05/2019   Procedure: ESOPHAGOGASTRODUODENOSCOPY (EGD) WITH PROPOFOL;  Surgeon: Toney Reil, MD;  Location: [facility] ENDOSCOPY;  Service: Gastroenterology;  Laterality: N/A;   FLEXIBLE SIGMOIDOSCOPY N/A 10/05/2019   Procedure: FLEXIBLE SIGMOIDOSCOPY;  Surgeon: Toney Reil, MD;  Location: [facility] ENDOSCOPY;  Service: Gastroenterology;  Laterality: N/A;   INTRAMEDULLARY (IM) NAIL INTERTROCHANTERIC Right 09/01/2019   Procedure: INTRAMEDULLARY (IM) NAIL INTERTROCHANTRIC AND RADIOFREQUENCY ABLATION;  Surgeon: Kennedy Bucker, MD;  Location: [facility] ORS;  Service: Orthopedics;  Laterality: Right;   KYPHOPLASTY N/A 08/29/2019   Procedure: KYPHOPLASTY T6, L1,L4 ,  RADIOFREQUENCY ABLATION;  Surgeon: Kennedy Bucker, MD;  Location: [facility] ORS;  Service: Orthopedics;  Laterality: N/A;   KYPHOPLASTY Right 09/01/2019   Procedure: Right Sacral Radiofrequency Ablation and Cement Augmentation, Right sacrum and iliac crest;  Surgeon: Hessie Knows, MD;  Location: [facility] ORS;  Service: Orthopedics;  Laterality: Right;   PORTA CATH  INSERTION N/A 08/28/2019   Procedure: PORTA CATH INSERTION;  Surgeon: Algernon Huxley, MD;  Location: [facility];  Service: Cardiovascular;  Laterality: N/A;    HEMATOLOGY/ONCOLOGY HISTORY:  Oncology History  Primary cancer of left breast with metastasis to other site [facility])  08/28/2019 Initial Diagnosis   Primary cancer of left breast with metastasis to other site [facility])   01/21/2021 Cancer Staging   Staging form: Breast, AJCC 8th Edition - Clinical stage from 01/21/2021: Stage IV (cT2, cN1, cM1, G2, ER-, PR-, HER2+) - Signed by Sindy Guadeloupe, MD on 01/21/2021 Histologic grading system: 3 grade system   Primary malignant neoplasm of breast with metastasis ([place])  08/29/2019 Initial Diagnosis   Metastatic breast cancer ([place])   09/04/2019 -  Chemotherapy   Patient is on Treatment Plan : BREAST Weekly Paclitaxel + Trastuzumab + Pertuzumab q21d      Genetic Testing   Negative genetic testing. No pathogenic variants identified on the [facility] CancerNext-Expanded+RNA panel. The report date is 04/13/2020.   The CancerNext-Expanded + RNAinsight gene panel offered by [employer] and includes sequencing and rearrangement analysis for the following 77 genes: IP, ALK, APC*, ATM*, AXIN2, BAP1, BARD1, BLM, BMPR1A, BRCA1*, BRCA2*, BRIP1*, CDC73, CDH1*,CDK4, CDKN1B, CDKN2A, CHEK2*, CTNNA1, DICER1, FANCC, FH, FLCN, GALNT12, KIF1B, LZTR1, MAX, MEN1, MET, MLH1*, MSH2*, MSH3, MSH6*, MUTYH*, NBN, NF1*, NF2, NTHL1, PALB2*, PHOX2B, PMS2*, POT1, PRKAR1A, PTCH1, PTEN*, RAD51C*, RAD51D*,RB1, RECQL, RET, SDHA, SDHAF2, SDHB, SDHC, SDHD, SMAD4, SMARCA4, SMARCB1, SMARCE1, STK11, SUFU, TMEM127, TP53*,TSC1, TSC2, VHL and XRCC2 (sequencing and deletion/duplication); EGFR, EGLN1, HOXB13, KIT, MITF, PDGFRA, POLD1 and POLE (sequencing only); EPCAM and GREM1 (deletion/duplication only). DNA and RNA analyses performed for * genes.     ALLERGIES:  has No Known Allergies.  MEDICATIONS:  Current Outpatient Medications   Medication Sig Dispense Refill   albuterol (VENTOLIN HFA) 108 (90 Base) MCG/ACT inhaler Inhale 2 puffs into the lungs every 6 (six) hours as needed for wheezing or shortness of breath. 8.5 g 2   benzoyl peroxide-erythromycin (BENZAMYCIN) gel Apply topically 2 (two) times daily. (Patient not taking: Reported on 09/09/2021) 46.6 g 0   butalbital-acetaminophen-caffeine (FIORICET) 50-325-40 MG tablet Take 1-2 tablets by mouth every 8 (eight) hours as needed for headache. (Patient not taking: Reported on 09/09/2021) 30 tablet 0   Calcium Carb-Cholecalciferol 600-10 MG-MCG TABS Take 1 tablet by mouth 2 (two) times daily. 180 tablet 0   capecitabine (XELODA) 500 MG tablet Take 3 tablets (1,500 mg total) by mouth 2 (two) times daily after a meal. Take for 14 days, then hold for 7 days. Repeat every 21 days. 84 tablet 1   diphenoxylate-atropine (LOMOTIL) 2.5-0.025 MG tablet Take 1 tablet by mouth 4 (four) times daily as needed for diarrhea or loose stools. 40 tablet 0   DULoxetine (CYMBALTA) 60 MG capsule Take 1 capsule (60 mg total) by mouth daily. 30 capsule 3   gabapentin (NEURONTIN) 600 MG tablet Take 1 tablet (600 mg total) by mouth 3 (three) times daily. 90 tablet 3   lidocaine-prilocaine (EMLA) cream Apply 1 application topically as needed. Apply small amount to port site at least 1 hour prior to it being accessed, cover with plastic wrap 30 g 3   LORazepam (ATIVAN) 0.5 MG  tablet Take 1 tablet (0.5 mg total) by mouth every 6 (six) hours as needed for anxiety. AS NEEDED FOR NAUSEA (Patient not taking: Reported on 08/05/2021) 45 tablet 0   morphine (MS CONTIN) 15 MG 12 hr tablet Take 1 tablet (15 mg total) by mouth every 12 (twelve) hours. 60 tablet 0   naloxone (NARCAN) nasal spray 4 mg/0.1 mL Administer one spray in nostril for opioid overdose. If unresponsive after 1 dose, may repeat a dose every 2-3 minutes in alternating nostrils until emergency services arrive. (Patient not taking: Reported on  10/07/2021) 2 each 3   OLANZapine (ZYPREXA) 10 MG tablet Take 1 tablet (10 mg total) by mouth at bedtime as needed (nausea). (Patient not taking: Reported on 06/24/2021) 30 tablet 2   ondansetron (ZOFRAN) 8 MG tablet Take 1 tablet (8 mg total) by mouth every 8 (eight) hours as needed for nausea or vomiting. 45 tablet 2   Oxycodone HCl 10 MG TABS Take 1/2 to 1 tablet (5-10 mg total) by mouth every 4 (four) hours as needed. 45 tablet 0   polyethylene glycol powder (MIRALAX) 17 GM/SCOOP powder Take 17 g by mouth daily as needed. 238 g 3   tucatinib (TUKYSA) 150 MG tablet Take 2 tablets (300 mg total) by mouth 2 (two) times daily. 120 tablet 1   No current facility-administered medications for this visit.   Facility-Administered Medications Ordered in Other Visits  Medication Dose Route Frequency Provider Last Rate Last Admin   0.9 %  sodium chloride infusion   Intravenous Continuous Jacquelin Hawking, NP   Stopped at 06/02/20 1453   heparin lock flush 100 unit/mL  500 Units Intravenous Once Sindy Guadeloupe, MD       heparin lock flush 100 unit/mL  500 Units Intravenous Once Sindy Guadeloupe, MD       sodium chloride flush (NS) 0.9 % injection 10 mL  10 mL Intravenous Once Sindy Guadeloupe, MD       sodium chloride flush (NS) 0.9 % injection 10 mL  10 mL Intravenous Once Sindy Guadeloupe, MD        VITAL SIGNS: There were no vitals taken for this visit. There were no vitals filed for this visit.  Estimated body mass index is 26.63 kg/m as calculated from the following:   Height as of 09/16/21: $RemoveBef'5\' 2"'[id number]$  (1.575 m).   Weight as of 10/07/21: 145 lb 9.6 oz (66 kg).  LABS: CBC:    Component Value Date/Time   WBC 5.0 10/07/2021 0953   HGB 12.9 10/07/2021 0953   HCT 38.7 10/07/2021 0953   PLT 281 10/07/2021 0953   MCV 86.4 10/07/2021 0953   NEUTROABS 3.1 10/07/2021 0953   LYMPHSABS 1.5 10/07/2021 0953   MONOABS 0.3 10/07/2021 0953   EOSABS 0.1 10/07/2021 0953   BASOSABS 0.0 10/07/2021 0953    Comprehensive Metabolic Panel:    Component Value Date/Time   NA 138 10/07/2021 0953   K 3.4 (L) 10/07/2021 0953   CL 109 10/07/2021 0953   CO2 23 10/07/2021 0953   BUN 11 10/07/2021 0953   CREATININE 0.56 10/07/2021 0953   GLUCOSE 157 (H) 10/07/2021 0953   CALCIUM 8.8 (L) 10/07/2021 0953   AST 29 10/07/2021 0953   ALT 23 10/07/2021 0953   ALKPHOS 74 10/07/2021 0953   BILITOT 0.7 10/07/2021 0953   PROT 7.2 10/07/2021 0953   ALBUMIN 3.6 10/07/2021 0953    RADIOGRAPHIC STUDIES: No results found.  PERFORMANCE STATUS (ECOG) :  2 - Symptomatic, <50% confined to bed  Review of Systems Unless otherwise noted, a complete review of systems is negative.  Physical Exam General: NAD Cardiovascular: regular rate and rhythm Pulmonary: clear ant fields Abdomen: soft, nontender, + bowel sounds GU: no suprapubic tenderness Extremities: no edema, no joint deformities Skin: no rashes Neurological: Weakness but otherwise nonfocal  IMPRESSION: Patient has new onset nausea with intermittent vomiting since starting Xeloda and tucatinib.  Very likely symptoms are secondary to oral chemo.  Also note slight rise in transaminases, likely also secondary to oral chemo.  Discussed with Dr. Janese Banks and [name] proceed with supportive care for now.  Patient is due for scans in 2 weeks.  Discussed scheduled ondansetron and [name] add back olanzapine at bedtime.  We [name] start PPI.  We [name] also plan to increase MS Contin to every 8 hours.   Patient noted to be mildly hypokalemic.  We [name] proceed with IV fluids and replete IV K today.    PLAN: -Continue current scope of treatment -IV fluids and IV K today -Increase MS Contin to every 8 hours -Continue oxycodone as needed for breakthrough pain -Daily bowel regimen -Restart olanzapine 10 mg at bedtime -Ondansetron 8 mg every 8 hours as needed -Start omeprazole 20 mg daily -Patient pending CTs  Case and plan discussed with Dr. Janese Banks  Patient expressed  understanding and was in agreement with this plan. She also understands that She can call clinic at any time with any questions, concerns, or complaints.   Thank you for allowing me to participate in the care of this very pleasant patient.   Time Total: 20 minutes  Visit consisted of counseling and education dealing with the complex and emotionally intense issues of symptom management in the setting of serious illness.Greater than 50%  of this time was spent counseling and coordinating care related to the above assessment and plan.  Signed by: Altha Harm, PhD, NP-C

## 2021-10-20 ENCOUNTER — Other Ambulatory Visit (HOSPITAL_COMMUNITY): Payer: Self-pay

## 2021-10-21 ENCOUNTER — Inpatient Hospital Stay: Payer: Self-pay

## 2021-10-21 ENCOUNTER — Inpatient Hospital Stay: Payer: Self-pay | Attending: Oncology

## 2021-10-21 ENCOUNTER — Inpatient Hospital Stay (HOSPITAL_BASED_OUTPATIENT_CLINIC_OR_DEPARTMENT_OTHER): Payer: Self-pay | Admitting: Hospice and Palliative Medicine

## 2021-10-21 VITALS — BP 108/76 | HR 69 | Temp 98.2°F

## 2021-10-21 DIAGNOSIS — R519 Headache, unspecified: Secondary | ICD-10-CM

## 2021-10-21 DIAGNOSIS — C7931 Secondary malignant neoplasm of brain: Secondary | ICD-10-CM | POA: Insufficient documentation

## 2021-10-21 DIAGNOSIS — C50919 Malignant neoplasm of unspecified site of unspecified female breast: Secondary | ICD-10-CM

## 2021-10-21 DIAGNOSIS — C7951 Secondary malignant neoplasm of bone: Secondary | ICD-10-CM | POA: Insufficient documentation

## 2021-10-21 DIAGNOSIS — R6884 Jaw pain: Secondary | ICD-10-CM | POA: Insufficient documentation

## 2021-10-21 DIAGNOSIS — G8929 Other chronic pain: Secondary | ICD-10-CM

## 2021-10-21 DIAGNOSIS — Z5112 Encounter for antineoplastic immunotherapy: Secondary | ICD-10-CM | POA: Insufficient documentation

## 2021-10-21 DIAGNOSIS — C50912 Malignant neoplasm of unspecified site of left female breast: Secondary | ICD-10-CM | POA: Insufficient documentation

## 2021-10-21 DIAGNOSIS — K089 Disorder of teeth and supporting structures, unspecified: Secondary | ICD-10-CM

## 2021-10-21 DIAGNOSIS — E860 Dehydration: Secondary | ICD-10-CM

## 2021-10-21 DIAGNOSIS — Z800 Family history of malignant neoplasm of digestive organs: Secondary | ICD-10-CM | POA: Insufficient documentation

## 2021-10-21 DIAGNOSIS — Z171 Estrogen receptor negative status [ER-]: Secondary | ICD-10-CM | POA: Insufficient documentation

## 2021-10-21 LAB — CBC WITH DIFFERENTIAL/PLATELET
Abs Immature Granulocytes: 0.01 10*3/uL (ref 0.00–0.07)
Basophils Absolute: 0 10*3/uL (ref 0.0–0.1)
Basophils Relative: 1 %
Eosinophils Absolute: 0.1 10*3/uL (ref 0.0–0.5)
Eosinophils Relative: 3 %
HCT: 37.1 % (ref 36.0–46.0)
Hemoglobin: 12.5 g/dL (ref 12.0–15.0)
Immature Granulocytes: 0 %
Lymphocytes Relative: 35 %
Lymphs Abs: 1.5 10*3/uL (ref 0.7–4.0)
MCH: 29 pg (ref 26.0–34.0)
MCHC: 33.7 g/dL (ref 30.0–36.0)
MCV: 86.1 fL (ref 80.0–100.0)
Monocytes Absolute: 0.4 10*3/uL (ref 0.1–1.0)
Monocytes Relative: 8 %
Neutro Abs: 2.3 10*3/uL (ref 1.7–7.7)
Neutrophils Relative %: 53 %
Platelets: 386 10*3/uL (ref 150–400)
RBC: 4.31 MIL/uL (ref 3.87–5.11)
RDW: 14.6 % (ref 11.5–15.5)
WBC: 4.3 10*3/uL (ref 4.0–10.5)
nRBC: 0 % (ref 0.0–0.2)

## 2021-10-21 LAB — COMPREHENSIVE METABOLIC PANEL
ALT: 55 U/L — ABNORMAL HIGH (ref 0–44)
AST: 41 U/L (ref 15–41)
Albumin: 4 g/dL (ref 3.5–5.0)
Alkaline Phosphatase: 68 U/L (ref 38–126)
Anion gap: 6 (ref 5–15)
BUN: 8 mg/dL (ref 6–20)
CO2: 26 mmol/L (ref 22–32)
Calcium: 8.7 mg/dL — ABNORMAL LOW (ref 8.9–10.3)
Chloride: 105 mmol/L (ref 98–111)
Creatinine, Ser: 0.55 mg/dL (ref 0.44–1.00)
GFR, Estimated: >60 mL/min (ref >60)
Glucose, Bld: 100 mg/dL — ABNORMAL HIGH (ref 70–99)
Potassium: 3.7 mmol/L (ref 3.5–5.1)
Sodium: 137 mmol/L (ref 135–145)
Total Bilirubin: 0.5 mg/dL (ref 0.3–1.2)
Total Protein: 7.6 g/dL (ref 6.5–8.1)

## 2021-10-21 MED ORDER — HEPARIN SOD (PORK) LOCK FLUSH 100 UNIT/ML IV SOLN
500.0000 [IU] | Freq: Once | INTRAVENOUS | Status: AC
  Administered 2021-10-21: 500 [IU] via INTRAVENOUS
  Filled 2021-10-21: qty 5

## 2021-10-21 MED ORDER — ACETAMINOPHEN 325 MG PO TABS
650.0000 mg | ORAL_TABLET | Freq: Once | ORAL | Status: AC
  Administered 2021-10-21: 650 mg via ORAL
  Filled 2021-10-21: qty 2

## 2021-10-21 MED ORDER — SODIUM CHLORIDE 0.9 % IV SOLN
Freq: Once | INTRAVENOUS | Status: AC
  Filled 2021-10-21: qty 250

## 2021-10-21 MED ORDER — SODIUM CHLORIDE 0.9% FLUSH
10.0000 mL | Freq: Once | INTRAVENOUS | Status: AC
  Administered 2021-10-21: 10 mL via INTRAVENOUS
  Filled 2021-10-21: qty 10

## 2021-10-21 NOTE — Progress Notes (Signed)
Labs okay today. Received 1 L NS over 1 hour. Received 2 tylenol for complaints of headache. Reports reduction in headache. Ate potato chips and sprite. Complains of R sided jaw pain when she chews and opens and closes her mouth. NP requested for evaluation.

## 2021-10-21 NOTE — Progress Notes (Signed)
Symptom Management and Supportive Care [facility] at [facility] Telephone:(336) [phone] Fax:(336) [phone]  Patient Care Team: Creig Hines, MD as PCP - General (Oncology) Jim Like, RN as Registered Nurse Scarlett Presto, RN (Inactive) as Registered Nurse Creig Hines, MD as Consulting Physician (Hematology and Oncology)   NAME OF PATIENT: Sophia [name]  [id number]  24-May-1980   DATE OF VISIT: 10/21/21  REASON FOR CONSULT: Sophia [name] is a 41 y.o. 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 MRI of the brain on 02/08/2021 revealed widespread metastatic disease to the brain with mild associated edema and small area of hemorrhage in the right frontal.  Patient is status post whole brain radiation.  She completed Taxol and iwas on Herceptin and Perjeta maintenance.  She is now on Herceptin with Xeloda and tucatinib.   INTERVAL HISTORY: Patient last saw [name] on 10/07/2021 at which time she was at baseline.  Patient was seen in clinic on 7/28 for nausea and vomiting thought secondary to Xeloda.  She has responded well to supportive care.  She was an add-on to my clinic schedule today while receiving fluids due to complaint of mouth/jaw pain.  Patient reports that she has had recent dental work with known extensive dental caries on the left and right side.  She had a recent filling on the left with plan to follow-up with dentist for fillings on the right side as well.  She has been having some right-sided discomfort.  Denies any neurologic complaints. Denies recent fevers or illnesses. Denies any easy bleeding or bruising. Reports fair appetite and denies weight loss. Denies chest pain.  Denies urinary complaints. Patient offers no further specific complaints today.  SOCIAL HISTORY:     reports that she has never smoked. She has never used smokeless  tobacco. She reports that she does not currently use alcohol. She reports that she does not currently use drugs.  Patient is from [place].  She speaks [place].  She lives here with her 3 children ages 62-16.  She has no family or friend/social support locally.  She is unable to work.  ADVANCE DIRECTIVES:  Does not have  CODE STATUS:    PAST MEDICAL HISTORY: Past Medical History:  Diagnosis Date   Anxiety    Breast cancer (HCC)    with mets   Cancer (HCC)    Colitis    COVID-19 in immunocompromised patient (HCC)    Family history of colon cancer    Vertigo     PAST SURGICAL HISTORY:  Past Surgical History:  Procedure Laterality Date   BREAST BIOPSY Left 08/14/2019   [place] bx of mass, coil marker, path pending   BREAST BIOPSY Left 08/14/2019   [place] bx of LN, hydromarker, path pending   BREAST BIOPSY Left 08/14/2019   affirm bx of calcs, x marker, path pending   ESOPHAGOGASTRODUODENOSCOPY (EGD) WITH PROPOFOL N/A 10/05/2019   Procedure: ESOPHAGOGASTRODUODENOSCOPY (EGD) WITH PROPOFOL;  Surgeon: Toney Reil, MD;  Location: [facility] ENDOSCOPY;  Service: Gastroenterology;  Laterality: N/A;   FLEXIBLE SIGMOIDOSCOPY N/A 10/05/2019   Procedure: FLEXIBLE SIGMOIDOSCOPY;  Surgeon: Toney Reil, MD;  Location: [facility] ENDOSCOPY;  Service: Gastroenterology;  Laterality: N/A;   INTRAMEDULLARY (IM) NAIL INTERTROCHANTERIC Right 09/01/2019   Procedure: INTRAMEDULLARY (IM) NAIL INTERTROCHANTRIC AND RADIOFREQUENCY ABLATION;  Surgeon: Kennedy Bucker, MD;  Location: [facility] ORS;  Service: Orthopedics;  Laterality: Right;   KYPHOPLASTY N/A 08/29/2019  Procedure: KYPHOPLASTY T6, L1,L4 ,  RADIOFREQUENCY ABLATION;  Surgeon: Hessie Knows, MD;  Location: [facility] ORS;  Service: Orthopedics;  Laterality: N/A;   KYPHOPLASTY Right 09/01/2019   Procedure: Right Sacral Radiofrequency Ablation and Cement Augmentation, Right sacrum and iliac crest;  Surgeon: Hessie Knows, MD;  Location: [facility] ORS;  Service: Orthopedics;   Laterality: Right;   PORTA CATH INSERTION N/A 08/28/2019   Procedure: PORTA CATH INSERTION;  Surgeon: Algernon Huxley, MD;  Location: [facility] CV LAB;  Service: Cardiovascular;  Laterality: N/A;    HEMATOLOGY/ONCOLOGY HISTORY:  Oncology History  Primary cancer of left breast with metastasis to other site [facility])  08/28/2019 Initial Diagnosis   Primary cancer of left breast with metastasis to other site [facility])   01/21/2021 Cancer Staging   Staging form: Breast, AJCC 8th Edition - Clinical stage from 01/21/2021: Stage IV (cT2, cN1, cM1, G2, ER-, PR-, HER2+) - Signed by Sindy Guadeloupe, MD on 01/21/2021 Histologic grading system: 3 grade system   Primary malignant neoplasm of breast with metastasis ([place])  08/29/2019 Initial Diagnosis   Metastatic breast cancer ([place])   09/04/2019 -  Chemotherapy   Patient is on Treatment Plan : BREAST Weekly Paclitaxel + Trastuzumab + Pertuzumab q21d      Genetic Testing   Negative genetic testing. No pathogenic variants identified on the [facility] CancerNext-Expanded+RNA panel. The report date is 04/13/2020.   The CancerNext-Expanded + RNAinsight gene panel offered by [employer] and includes sequencing and rearrangement analysis for the following 77 genes: IP, ALK, APC*, ATM*, AXIN2, BAP1, BARD1, BLM, BMPR1A, BRCA1*, BRCA2*, BRIP1*, CDC73, CDH1*,CDK4, CDKN1B, CDKN2A, CHEK2*, CTNNA1, DICER1, FANCC, FH, FLCN, GALNT12, KIF1B, LZTR1, MAX, MEN1, MET, MLH1*, MSH2*, MSH3, MSH6*, MUTYH*, NBN, NF1*, NF2, NTHL1, PALB2*, PHOX2B, PMS2*, POT1, PRKAR1A, PTCH1, PTEN*, RAD51C*, RAD51D*,RB1, RECQL, RET, SDHA, SDHAF2, SDHB, SDHC, SDHD, SMAD4, SMARCA4, SMARCB1, SMARCE1, STK11, SUFU, TMEM127, TP53*,TSC1, TSC2, VHL and XRCC2 (sequencing and deletion/duplication); EGFR, EGLN1, HOXB13, KIT, MITF, PDGFRA, POLD1 and POLE (sequencing only); EPCAM and GREM1 (deletion/duplication only). DNA and RNA analyses performed for * genes.     ALLERGIES:  has No Known Allergies.  MEDICATIONS:   Current Outpatient Medications  Medication Sig Dispense Refill   albuterol (VENTOLIN HFA) 108 (90 Base) MCG/ACT inhaler Inhale 2 puffs into the lungs every 6 (six) hours as needed for wheezing or shortness of breath. 8.5 g 2   benzoyl peroxide-erythromycin (BENZAMYCIN) gel Apply topically 2 (two) times daily. (Patient not taking: Reported on 09/09/2021) 46.6 g 0   butalbital-acetaminophen-caffeine (FIORICET) 50-325-40 MG tablet Take 1-2 tablets by mouth every 8 (eight) hours as needed for headache. (Patient not taking: Reported on 09/09/2021) 30 tablet 0   Calcium Carb-Cholecalciferol 600-10 MG-MCG TABS Take 1 tablet by mouth 2 (two) times daily. 180 tablet 0   capecitabine (XELODA) 500 MG tablet Take 3 tablets (1,500 mg total) by mouth 2 (two) times daily after a meal. Take for 14 days, then hold for 7 days. Repeat every 21 days. 84 tablet 1   diphenoxylate-atropine (LOMOTIL) 2.5-0.025 MG tablet Take 1 tablet by mouth 4 (four) times daily as needed for diarrhea or loose stools. 40 tablet 0   DULoxetine (CYMBALTA) 60 MG capsule Take 1 capsule (60 mg total) by mouth daily. 30 capsule 3   gabapentin (NEURONTIN) 600 MG tablet Take 1 tablet (600 mg total) by mouth 3 (three) times daily. 90 tablet 3   lidocaine-prilocaine (EMLA) cream Apply 1 application topically as needed. Apply small amount to port site at least 1 hour  prior to it being accessed, cover with plastic wrap 30 g 3   LORazepam (ATIVAN) 0.5 MG tablet Take 1 tablet (0.5 mg total) by mouth every 6 (six) hours as needed for anxiety. AS NEEDED FOR NAUSEA (Patient not taking: Reported on 08/05/2021) 45 tablet 0   morphine (MS CONTIN) 15 MG 12 hr tablet Take 1 tablet (15 mg total) by mouth every 8 (eight) hours. 90 tablet 0   naloxone (NARCAN) nasal spray 4 mg/0.1 mL Administer one spray in nostril for opioid overdose. If unresponsive after 1 dose, may repeat a dose every 2-3 minutes in alternating nostrils until emergency services arrive. (Patient  not taking: Reported on 10/07/2021) 2 each 3   OLANZapine (ZYPREXA) 10 MG tablet Take 1 tablet (10 mg total) by mouth at bedtime as needed (nausea). 30 tablet 2   omeprazole (PRILOSEC) 20 MG capsule Take 1 capsule (20 mg total) by mouth daily. 30 capsule 3   ondansetron (ZOFRAN) 8 MG tablet Take 1 tablet (8 mg total) by mouth every 8 (eight) hours as needed for nausea or vomiting. 45 tablet 2   Oxycodone HCl 10 MG TABS Take 1/2 to 1 tablet (5-10 mg total) by mouth every 4 (four) hours as needed. 45 tablet 0   polyethylene glycol powder (MIRALAX) 17 GM/SCOOP powder Take 17 g by mouth daily as needed. 238 g 3   tucatinib (TUKYSA) 150 MG tablet Take 2 tablets (300 mg total) by mouth 2 (two) times daily. 120 tablet 1   No current facility-administered medications for this visit.   Facility-Administered Medications Ordered in Other Visits  Medication Dose Route Frequency Provider Last Rate Last Admin   0.9 %  sodium chloride infusion   Intravenous Continuous Jacquelin Hawking, NP   Stopped at 06/02/20 1453   0.9 %  sodium chloride infusion   Intravenous Once Sindy Guadeloupe, MD 999 mL/hr at 10/21/21 1329 New Bag at 10/21/21 1329   heparin lock flush 100 unit/mL  500 Units Intravenous Once Sindy Guadeloupe, MD       heparin lock flush 100 unit/mL  500 Units Intravenous Once Sindy Guadeloupe, MD       heparin lock flush 100 unit/mL  500 Units Intravenous Once Sindy Guadeloupe, MD       sodium chloride flush (NS) 0.9 % injection 10 mL  10 mL Intravenous Once Sindy Guadeloupe, MD       sodium chloride flush (NS) 0.9 % injection 10 mL  10 mL Intravenous Once Sindy Guadeloupe, MD        VITAL SIGNS: There were no vitals taken for this visit. There were no vitals filed for this visit.  Estimated body mass index is 26.63 kg/m as calculated from the following:   Height as of 09/16/21: $RemoveBef'5\' 2"'phnxtRsTkO$  (1.575 m).   Weight as of 10/07/21: 145 lb 9.6 oz (66 kg).  LABS: CBC:    Component Value Date/Time   WBC 5.9 10/14/2021  1355   HGB 13.0 10/14/2021 1355   HCT 38.8 10/14/2021 1355   PLT 340 10/14/2021 1355   MCV 85.1 10/14/2021 1355   NEUTROABS 4.3 10/14/2021 1355   LYMPHSABS 1.3 10/14/2021 1355   MONOABS 0.3 10/14/2021 1355   EOSABS 0.1 10/14/2021 1355   BASOSABS 0.0 10/14/2021 1355   Comprehensive Metabolic Panel:    Component Value Date/Time   NA 137 10/14/2021 1355   K 3.3 (L) 10/14/2021 1355   CL 106 10/14/2021 1355   CO2 24  10/14/2021 1355   BUN 8 10/14/2021 1355   CREATININE 0.70 10/14/2021 1355   GLUCOSE 161 (H) 10/14/2021 1355   CALCIUM 8.3 (L) 10/14/2021 1355   AST 67 (H) 10/14/2021 1355   ALT 91 (H) 10/14/2021 1355   ALKPHOS 84 10/14/2021 1355   BILITOT 0.9 10/14/2021 1355   PROT 7.7 10/14/2021 1355   ALBUMIN 4.2 10/14/2021 1355    RADIOGRAPHIC STUDIES: No results found.  PERFORMANCE STATUS (ECOG) : 2 - Symptomatic, <50% confined to bed  Review of Systems Unless otherwise noted, a complete review of systems is negative.  Physical Exam General: NAD Cardiovascular: regular rate and rhythm Pulmonary: clear ant fields Abdomen: soft, nontender, + bowel sounds GU: no suprapubic tenderness Extremities: no edema, no joint deformities Skin: no rashes Neurological: Weakness but otherwise nonfocal  IMPRESSION: Patient has some mouth/jaw discomfort.  This certainly could be explained by her known dental caries and patient is established and being followed by a dentist.  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  Discussed with Dr. Grayland Ormond who is covering for Dr. Janese Banks.  Will monitor symptoms for now but plan for imaging if symptoms persist or worsen.    PLAN: -Continue current scope of treatment -Plan for CT of the jaw if pain persists or worsens -Continue pain regimen with MS Contin and oxycodone  Case and plan discussed with Dr. Grayland Ormond  Patient expressed understanding and was in agreement with this plan. She  also understands that She can call clinic at any time with any questions, concerns, or complaints.   Thank you for allowing me to participate in the care of this very pleasant patient.   Time Total: 20 minutes  Visit consisted of counseling and education dealing with the complex and emotionally intense issues of symptom management in the setting of serious illness.Greater than 50%  of this time was spent counseling and coordinating care related to the above assessment and plan.  Signed by: Altha Harm, PhD, NP-C

## 2021-10-22 ENCOUNTER — Other Ambulatory Visit (HOSPITAL_COMMUNITY): Payer: Self-pay

## 2021-10-24 ENCOUNTER — Encounter: Payer: Self-pay | Admitting: Oncology

## 2021-10-24 ENCOUNTER — Other Ambulatory Visit (HOSPITAL_COMMUNITY): Payer: Self-pay

## 2021-10-25 ENCOUNTER — Other Ambulatory Visit: Payer: Self-pay | Admitting: Hospice and Palliative Medicine

## 2021-10-25 ENCOUNTER — Telehealth: Payer: Self-pay | Admitting: *Deleted

## 2021-10-25 DIAGNOSIS — G8929 Other chronic pain: Secondary | ICD-10-CM

## 2021-10-25 NOTE — Progress Notes (Signed)
RN spoke with patient on phone. Pain still prevalent in jaw. Will send for XR.

## 2021-10-26 ENCOUNTER — Encounter: Payer: Self-pay | Admitting: Oncology

## 2021-10-26 ENCOUNTER — Inpatient Hospital Stay: Payer: Self-pay

## 2021-10-26 ENCOUNTER — Ambulatory Visit: Admission: RE | Admit: 2021-10-26 | Payer: Self-pay | Source: Home / Self Care | Admitting: *Deleted

## 2021-10-26 ENCOUNTER — Encounter: Payer: Self-pay | Admitting: Hospice and Palliative Medicine

## 2021-10-26 ENCOUNTER — Inpatient Hospital Stay (HOSPITAL_BASED_OUTPATIENT_CLINIC_OR_DEPARTMENT_OTHER): Payer: Self-pay | Admitting: Hospice and Palliative Medicine

## 2021-10-26 VITALS — BP 114/70 | HR 67 | Temp 98.0°F | Resp 16

## 2021-10-26 DIAGNOSIS — G4489 Other headache syndrome: Secondary | ICD-10-CM

## 2021-10-26 DIAGNOSIS — C50919 Malignant neoplasm of unspecified site of unspecified female breast: Secondary | ICD-10-CM

## 2021-10-26 DIAGNOSIS — R208 Other disturbances of skin sensation: Secondary | ICD-10-CM

## 2021-10-26 LAB — URINALYSIS, COMPLETE (UACMP) WITH MICROSCOPIC
Bacteria, UA: NONE SEEN
Bilirubin Urine: NEGATIVE
Glucose, UA: NEGATIVE mg/dL
Hgb urine dipstick: NEGATIVE
Ketones, ur: NEGATIVE mg/dL
Leukocytes,Ua: NEGATIVE
Nitrite: NEGATIVE
Protein, ur: NEGATIVE mg/dL
Specific Gravity, Urine: 1.017 (ref 1.005–1.030)
Squamous Epithelial / HPF: NONE SEEN (ref 0–5)
pH: 6 (ref 5.0–8.0)

## 2021-10-26 LAB — CBC WITH DIFFERENTIAL/PLATELET
Abs Immature Granulocytes: 0.01 10*3/uL (ref 0.00–0.07)
Basophils Absolute: 0 10*3/uL (ref 0.0–0.1)
Basophils Relative: 1 %
Eosinophils Absolute: 0.1 10*3/uL (ref 0.0–0.5)
Eosinophils Relative: 2 %
HCT: 39.2 % (ref 36.0–46.0)
Hemoglobin: 13 g/dL (ref 12.0–15.0)
Immature Granulocytes: 0 %
Lymphocytes Relative: 29 %
Lymphs Abs: 1.4 10*3/uL (ref 0.7–4.0)
MCH: 29 pg (ref 26.0–34.0)
MCHC: 33.2 g/dL (ref 30.0–36.0)
MCV: 87.3 fL (ref 80.0–100.0)
Monocytes Absolute: 0.4 10*3/uL (ref 0.1–1.0)
Monocytes Relative: 8 %
Neutro Abs: 2.8 10*3/uL (ref 1.7–7.7)
Neutrophils Relative %: 60 %
Platelets: 316 10*3/uL (ref 150–400)
RBC: 4.49 MIL/uL (ref 3.87–5.11)
RDW: 15.3 % (ref 11.5–15.5)
WBC: 4.7 10*3/uL (ref 4.0–10.5)
nRBC: 0 % (ref 0.0–0.2)

## 2021-10-26 MED ORDER — ACETAMINOPHEN 325 MG PO TABS
650.0000 mg | ORAL_TABLET | Freq: Once | ORAL | Status: AC
  Administered 2021-10-26: 650 mg via ORAL
  Filled 2021-10-26: qty 2

## 2021-10-26 NOTE — Progress Notes (Signed)
Symptom Management and [place] at [facility] Telephone:(336) [phone] Fax:(336) [phone]  Patient Care Team: Sindy Guadeloupe, MD as PCP - General (Oncology) Rico Junker, RN as Registered Nurse Theodore Demark, RN (Inactive) as Registered Nurse Sindy Guadeloupe, MD as Consulting Physician (Hematology and Oncology)   NAME OF PATIENT: Sophia [name]  [id number]  04-04-1980   DATE OF VISIT: 10/26/21  REASON FOR CONSULT: Sophia [name] is a 41 y.o. 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 MRI of the brain on 02/08/2021 revealed widespread metastatic disease to the brain with mild associated edema and small area of hemorrhage in the right frontal.  Patient is status post whole brain radiation.  She completed Taxol and iwas on Herceptin and Perjeta maintenance.  She is now on Herceptin with Xeloda and tucatinib.   INTERVAL HISTORY: Patient was added on to clinic today at her request with recent development of dysuria and urinary frequency.  She denies any fever or chills.  No other symptomatic complaints or concerns.  No other acute changes reported.  Patient continues to describe some right-sided jaw discomfort.  She is pending x-ray.  Denies any neurologic complaints. Denies recent fevers or illnesses. Denies any easy bleeding or bruising. Reports fair appetite and denies weight loss. Denies chest pain.  Denies urinary complaints. Patient offers no further specific complaints today.  SOCIAL HISTORY:     reports that she has never smoked. She has never used smokeless tobacco. She reports that she does not currently use alcohol. She reports that she does not currently use drugs.  Patient is from [place].  She speaks [place].  She lives here with her 3 children ages 54-16.  She has no family or friend/social support locally.  She is unable to  work.  ADVANCE DIRECTIVES:  Does not have  CODE STATUS:    PAST MEDICAL HISTORY: Past Medical History:  Diagnosis Date   Anxiety    Breast cancer ([place])    with mets   Cancer ([place])    Colitis    COVID-19 in immunocompromised patient ([place])    Family history of colon cancer    Vertigo     PAST SURGICAL HISTORY:  Past Surgical History:  Procedure Laterality Date   BREAST BIOPSY Left 08/14/2019   [place] bx of mass, coil marker, path pending   BREAST BIOPSY Left 08/14/2019   [place] bx of LN, hydromarker, path pending   BREAST BIOPSY Left 08/14/2019   affirm bx of calcs, x marker, path pending   ESOPHAGOGASTRODUODENOSCOPY (EGD) WITH PROPOFOL N/A 10/05/2019   Procedure: ESOPHAGOGASTRODUODENOSCOPY (EGD) WITH PROPOFOL;  Surgeon: Lin Landsman, MD;  Location: [place];  Service: Gastroenterology;  Laterality: N/A;   FLEXIBLE SIGMOIDOSCOPY N/A 10/05/2019   Procedure: FLEXIBLE SIGMOIDOSCOPY;  Surgeon: Lin Landsman, MD;  Location: [facility] ENDOSCOPY;  Service: Gastroenterology;  Laterality: N/A;   INTRAMEDULLARY (IM) NAIL INTERTROCHANTERIC Right 09/01/2019   Procedure: INTRAMEDULLARY (IM) NAIL INTERTROCHANTRIC AND RADIOFREQUENCY ABLATION;  Surgeon: Hessie Knows, MD;  Location: [facility] ORS;  Service: Orthopedics;  Laterality: Right;   KYPHOPLASTY N/A 08/29/2019   Procedure: KYPHOPLASTY T6, L1,L4 ,  RADIOFREQUENCY ABLATION;  Surgeon: Hessie Knows, MD;  Location: [facility] ORS;  Service: Orthopedics;  Laterality: N/A;   KYPHOPLASTY Right 09/01/2019   Procedure: Right Sacral Radiofrequency Ablation and Cement Augmentation, Right sacrum and iliac crest;  Surgeon: Hessie Knows, MD;  Location: [facility] ORS;  Service: Orthopedics;  Laterality: Right;   PORTA CATH INSERTION N/A 08/28/2019   Procedure: PORTA CATH INSERTION;  Surgeon: Algernon Huxley, MD;  Location: [place] CV LAB;  Service: Cardiovascular;  Laterality: N/A;    HEMATOLOGY/ONCOLOGY HISTORY:  Oncology History  Primary cancer of left  breast with metastasis to other site [facility])  08/28/2019 Initial Diagnosis   Primary cancer of left breast with metastasis to other site [facility])   01/21/2021 Cancer Staging   Staging form: Breast, AJCC 8th Edition - Clinical stage from 01/21/2021: Stage IV (cT2, cN1, cM1, G2, ER-, PR-, HER2+) - Signed by Sindy Guadeloupe, MD on 01/21/2021 Histologic grading system: 3 grade system   Primary malignant neoplasm of breast with metastasis ([place])  08/29/2019 Initial Diagnosis   Metastatic breast cancer ([place])   09/04/2019 -  Chemotherapy   Patient is on Treatment Plan : BREAST Weekly Paclitaxel + Trastuzumab + Pertuzumab q21d      Genetic Testing   Negative genetic testing. No pathogenic variants identified on the [facility] CancerNext-Expanded+RNA panel. The report date is 04/13/2020.   The CancerNext-Expanded + RNAinsight gene panel offered by [employer] and includes sequencing and rearrangement analysis for the following 77 genes: IP, ALK, APC*, ATM*, AXIN2, BAP1, BARD1, BLM, BMPR1A, BRCA1*, BRCA2*, BRIP1*, CDC73, CDH1*,CDK4, CDKN1B, CDKN2A, CHEK2*, CTNNA1, DICER1, FANCC, FH, FLCN, GALNT12, KIF1B, LZTR1, MAX, MEN1, MET, MLH1*, MSH2*, MSH3, MSH6*, MUTYH*, NBN, NF1*, NF2, NTHL1, PALB2*, PHOX2B, PMS2*, POT1, PRKAR1A, PTCH1, PTEN*, RAD51C*, RAD51D*,RB1, RECQL, RET, SDHA, SDHAF2, SDHB, SDHC, SDHD, SMAD4, SMARCA4, SMARCB1, SMARCE1, STK11, SUFU, TMEM127, TP53*,TSC1, TSC2, VHL and XRCC2 (sequencing and deletion/duplication); EGFR, EGLN1, HOXB13, KIT, MITF, PDGFRA, POLD1 and POLE (sequencing only); EPCAM and GREM1 (deletion/duplication only). DNA and RNA analyses performed for * genes.     ALLERGIES:  has No Known Allergies.  MEDICATIONS:  Current Outpatient Medications  Medication Sig Dispense Refill   albuterol (VENTOLIN HFA) 108 (90 Base) MCG/ACT inhaler Inhale 2 puffs into the lungs every 6 (six) hours as needed for wheezing or shortness of breath. 8.5 g 2   benzoyl peroxide-erythromycin (BENZAMYCIN) gel Apply  topically 2 (two) times daily. (Patient not taking: Reported on 09/09/2021) 46.6 g 0   butalbital-acetaminophen-caffeine (FIORICET) 50-325-40 MG tablet Take 1-2 tablets by mouth every 8 (eight) hours as needed for headache. (Patient not taking: Reported on 09/09/2021) 30 tablet 0   Calcium Carb-Cholecalciferol 600-10 MG-MCG TABS Take 1 tablet by mouth 2 (two) times daily. 180 tablet 0   capecitabine (XELODA) 500 MG tablet Take 3 tablets (1,500 mg total) by mouth 2 (two) times daily after a meal. Take for 14 days, then hold for 7 days. Repeat every 21 days. 84 tablet 1   diphenoxylate-atropine (LOMOTIL) 2.5-0.025 MG tablet Take 1 tablet by mouth 4 (four) times daily as needed for diarrhea or loose stools. 40 tablet 0   DULoxetine (CYMBALTA) 60 MG capsule Take 1 capsule (60 mg total) by mouth daily. 30 capsule 3   gabapentin (NEURONTIN) 600 MG tablet Take 1 tablet (600 mg total) by mouth 3 (three) times daily. 90 tablet 3   lidocaine-prilocaine (EMLA) cream Apply 1 application topically as needed. Apply small amount to port site at least 1 hour prior to it being accessed, cover with plastic wrap 30 g 3   LORazepam (ATIVAN) 0.5 MG tablet Take 1 tablet (0.5 mg total) by mouth every 6 (six) hours as needed for anxiety. AS NEEDED FOR NAUSEA (Patient not taking: Reported on 08/05/2021) 45 tablet 0   morphine (MS CONTIN) 15 MG 12  hr tablet Take 1 tablet (15 mg total) by mouth every 8 (eight) hours. 90 tablet 0   naloxone (NARCAN) nasal spray 4 mg/0.1 mL Administer one spray in nostril for opioid overdose. If unresponsive after 1 dose, may repeat a dose every 2-3 minutes in alternating nostrils until emergency services arrive. (Patient not taking: Reported on 10/07/2021) 2 each 3   OLANZapine (ZYPREXA) 10 MG tablet Take 1 tablet (10 mg total) by mouth at bedtime as needed (nausea). 30 tablet 2   omeprazole (PRILOSEC) 20 MG capsule Take 1 capsule (20 mg total) by mouth daily. 30 capsule 3   ondansetron (ZOFRAN) 8 MG  tablet Take 1 tablet (8 mg total) by mouth every 8 (eight) hours as needed for nausea or vomiting. 45 tablet 2   Oxycodone HCl 10 MG TABS Take 1/2 to 1 tablet (5-10 mg total) by mouth every 4 (four) hours as needed. 45 tablet 0   polyethylene glycol powder (MIRALAX) 17 GM/SCOOP powder Take 17 g by mouth daily as needed. 238 g 3   tucatinib (TUKYSA) 150 MG tablet Take 2 tablets (300 mg total) by mouth 2 (two) times daily. 120 tablet 1   No current facility-administered medications for this visit.   Facility-Administered Medications Ordered in Other Visits  Medication Dose Route Frequency Provider Last Rate Last Admin   0.9 %  sodium chloride infusion   Intravenous Continuous Jacquelin Hawking, NP   Stopped at 06/02/20 1453   heparin lock flush 100 unit/mL  500 Units Intravenous Once Sindy Guadeloupe, MD       heparin lock flush 100 unit/mL  500 Units Intravenous Once Sindy Guadeloupe, MD       sodium chloride flush (NS) 0.9 % injection 10 mL  10 mL Intravenous Once Sindy Guadeloupe, MD       sodium chloride flush (NS) 0.9 % injection 10 mL  10 mL Intravenous Once Sindy Guadeloupe, MD        VITAL SIGNS: There were no vitals taken for this visit. There were no vitals filed for this visit.  Estimated body mass index is 26.63 kg/m as calculated from the following:   Height as of 09/16/21: $RemoveBef'5\' 2"'vpGoGkMNlM$  (1.575 m).   Weight as of 10/07/21: 145 lb 9.6 oz (66 kg).  LABS: CBC:    Component Value Date/Time   WBC 4.7 10/26/2021 1138   HGB 13.0 10/26/2021 1138   HCT 39.2 10/26/2021 1138   PLT 316 10/26/2021 1138   MCV 87.3 10/26/2021 1138   NEUTROABS 2.8 10/26/2021 1138   LYMPHSABS 1.4 10/26/2021 1138   MONOABS 0.4 10/26/2021 1138   EOSABS 0.1 10/26/2021 1138   BASOSABS 0.0 10/26/2021 1138   Comprehensive Metabolic Panel:    Component Value Date/Time   NA 137 10/21/2021 1331   K 3.7 10/21/2021 1331   CL 105 10/21/2021 1331   CO2 26 10/21/2021 1331   BUN 8 10/21/2021 1331   CREATININE 0.55 10/21/2021  1331   GLUCOSE 100 (H) 10/21/2021 1331   CALCIUM 8.7 (L) 10/21/2021 1331   AST 41 10/21/2021 1331   ALT 55 (H) 10/21/2021 1331   ALKPHOS 68 10/21/2021 1331   BILITOT 0.5 10/21/2021 1331   PROT 7.6 10/21/2021 1331   ALBUMIN 4.0 10/21/2021 1331    RADIOGRAPHIC STUDIES: No results found.  PERFORMANCE STATUS (ECOG) : 2 - Symptomatic, <50% confined to bed  Review of Systems Unless otherwise noted, a complete review of systems is negative.  Physical Exam General:  NAD Cardiovascular: regular rate and rhythm Pulmonary: clear ant fields Abdomen: soft, nontender, + bowel sounds GU: no suprapubic tenderness Extremities: no edema, no joint deformities Skin: no rashes Neurological: Weakness but otherwise nonfocal  IMPRESSION: Patient is recent onset of dysuria and some urinary frequency.  No fever or chills.  Exam is benign.  UA is not suggestive of UTI.  Would recommend symptomatic management with increased fluids.  She can try cranberry juice and Azo as needed.  Will await culture but hold on starting antibiotics at this time.  Patient is still pending x-ray for evaluation of jaw pain.    PLAN: -Continue current scope of treatment -Await urine culture -X-ray for jaw pain -Continue pain regimen with MS Contin and oxycodone   Patient expressed understanding and was in agreement with this plan. She also understands that She can call clinic at any time with any questions, concerns, or complaints.   Thank you for allowing me to participate in the care of this very pleasant patient.   Time Total: 20 minutes  Visit consisted of counseling and education dealing with the complex and emotionally intense issues of symptom management in the setting of serious illness.Greater than 50%  of this time was spent counseling and coordinating care related to the above assessment and plan.  Signed by: Altha Harm, PhD, NP-C

## 2021-10-26 NOTE — Progress Notes (Signed)
PT. Has HA today and felt like the wrap on her head was hurting but she took it off and that was not the reason. She gets HA several days. She wanted to have tylenol for it. And it was given. She was here to get xray but [facility] does not have the ability to do xray here and she has to go to [place]. We gave her directions to the [place] location. I printed her next appts for her also. She has been kicked out of her trailer and now living with a friend in [place].

## 2021-10-26 NOTE — Telephone Encounter (Signed)
10/25/21- contacted patient at 2pm with [facility] interpreter to follow-up on pt's jaw pain. Pt reports that jaw pain is not any worse. She is unable to get in with the dentist until September. Pt has a h/o xgeva. Jaw would like to order dx imaging to r/o osteonecrosis of jaw. [name], NP ordered a a dg orthopantogram to further evaluate pt. Pt instructed to go to out patient imaging at [facility] for test. Pt stated that she will go on 8/9 for test.

## 2021-10-27 ENCOUNTER — Ambulatory Visit (HOSPITAL_COMMUNITY)
Admission: RE | Admit: 2021-10-27 | Discharge: 2021-10-27 | Disposition: A | Discharge status: Home / Self Care | Payer: Self-pay | Source: Ambulatory Visit | Attending: Hospice and Palliative Medicine | Admitting: Hospice and Palliative Medicine

## 2021-10-27 DIAGNOSIS — G8929 Other chronic pain: Secondary | ICD-10-CM | POA: Insufficient documentation

## 2021-10-27 DIAGNOSIS — K089 Disorder of teeth and supporting structures, unspecified: Secondary | ICD-10-CM | POA: Insufficient documentation

## 2021-10-28 ENCOUNTER — Ambulatory Visit
Admission: RE | Admit: 2021-10-28 | Discharge: 2021-10-28 | Disposition: A | Discharge status: Home / Self Care | Payer: Self-pay | Source: Ambulatory Visit | Attending: Oncology | Admitting: Oncology

## 2021-10-28 ENCOUNTER — Telehealth: Payer: Self-pay | Admitting: *Deleted

## 2021-10-28 DIAGNOSIS — Z0189 Encounter for other specified special examinations: Secondary | ICD-10-CM

## 2021-10-28 DIAGNOSIS — C50911 Malignant neoplasm of unspecified site of right female breast: Secondary | ICD-10-CM | POA: Insufficient documentation

## 2021-10-28 DIAGNOSIS — C7931 Secondary malignant neoplasm of brain: Secondary | ICD-10-CM | POA: Insufficient documentation

## 2021-10-28 DIAGNOSIS — C50919 Malignant neoplasm of unspecified site of unspecified female breast: Secondary | ICD-10-CM

## 2021-10-28 LAB — ECHOCARDIOGRAM COMPLETE
AR max vel: 2.26 cm2
AV Area VTI: 2.67 cm2
AV Area mean vel: 2.42 cm2
AV Mean grad: 3 mmHg
AV Peak grad: 5.3 mmHg
Ao pk vel: 1.15 m/s
Area-P 1/2: 2.95 cm2
S' Lateral: 2.68 cm

## 2021-10-28 LAB — URINE CULTURE: Culture: <10000 — AB

## 2021-10-28 NOTE — Telephone Encounter (Addendum)
Msg from [facility] NP - Will you please have her follow up with her dentist. We can refer to Dr. Hampton Abbot in [facility] if she needs oral surgery.  I contacted [facility] interpreter to reach patient to discuss the dg orthopantogram report. Estill Bamberg, interpreter had to leave a detailed vm for patient. I asked patient to give our office a call if she prefers a referral to Dr. Romilda Garret in [place]. Encouraged pt to follow-up with dentist. No signs of infection in jaw; however, she has tooth decay seen on xray.   IMPRESSION: 1. No single-view radiographic evidence of osteonecrosis of the mandible. 2. The right maxillary wisdom tooth has roots remaining in the bone, but the crown of the tooth is either completely decayed off, or a prior extraction may have been attempted. Follow-up with a dentist or oral surgeon is recommended. 3. Obtain MRI or CT if there is clinical concern for occult osteonecrosis.

## 2021-10-28 NOTE — Progress Notes (Signed)
*  PRELIMINARY RESULTS* Echocardiogram 2D Echocardiogram has been performed.  Sophia [name] 10/28/2021, 12:04 PM

## 2021-10-31 ENCOUNTER — Inpatient Hospital Stay: Payer: Self-pay

## 2021-10-31 ENCOUNTER — Inpatient Hospital Stay: Payer: Self-pay | Admitting: Pharmacist

## 2021-10-31 ENCOUNTER — Other Ambulatory Visit: Payer: Self-pay | Admitting: *Deleted

## 2021-10-31 ENCOUNTER — Other Ambulatory Visit: Payer: Self-pay

## 2021-10-31 ENCOUNTER — Encounter: Payer: Self-pay | Admitting: Oncology

## 2021-10-31 ENCOUNTER — Inpatient Hospital Stay (HOSPITAL_BASED_OUTPATIENT_CLINIC_OR_DEPARTMENT_OTHER): Payer: Self-pay | Admitting: Nurse Practitioner

## 2021-10-31 VITALS — BP 115/72 | HR 71 | Temp 98.3°F | Resp 16 | Wt 139.0 lb

## 2021-10-31 DIAGNOSIS — C50919 Malignant neoplasm of unspecified site of unspecified female breast: Secondary | ICD-10-CM

## 2021-10-31 DIAGNOSIS — Z5112 Encounter for antineoplastic immunotherapy: Secondary | ICD-10-CM

## 2021-10-31 DIAGNOSIS — G893 Neoplasm related pain (acute) (chronic): Secondary | ICD-10-CM

## 2021-10-31 DIAGNOSIS — M8440XA Pathological fracture, unspecified site, initial encounter for fracture: Secondary | ICD-10-CM

## 2021-10-31 LAB — CBC WITH DIFFERENTIAL/PLATELET
Abs Immature Granulocytes: 0.02 10*3/uL (ref 0.00–0.07)
Basophils Absolute: 0 10*3/uL (ref 0.0–0.1)
Basophils Relative: 1 %
Eosinophils Absolute: 0.1 10*3/uL (ref 0.0–0.5)
Eosinophils Relative: 3 %
HCT: 38 % (ref 36.0–46.0)
Hemoglobin: 12.4 g/dL (ref 12.0–15.0)
Immature Granulocytes: 1 %
Lymphocytes Relative: 29 %
Lymphs Abs: 1.3 10*3/uL (ref 0.7–4.0)
MCH: 28.6 pg (ref 26.0–34.0)
MCHC: 32.6 g/dL (ref 30.0–36.0)
MCV: 87.6 fL (ref 80.0–100.0)
Monocytes Absolute: 0.3 10*3/uL (ref 0.1–1.0)
Monocytes Relative: 7 %
Neutro Abs: 2.6 10*3/uL (ref 1.7–7.7)
Neutrophils Relative %: 59 %
Platelets: 246 10*3/uL (ref 150–400)
RBC: 4.34 MIL/uL (ref 3.87–5.11)
RDW: 14.9 % (ref 11.5–15.5)
WBC: 4.4 10*3/uL (ref 4.0–10.5)
nRBC: 0 % (ref 0.0–0.2)

## 2021-10-31 LAB — COMPREHENSIVE METABOLIC PANEL
ALT: 33 U/L (ref 0–44)
AST: 39 U/L (ref 15–41)
Albumin: 3.9 g/dL (ref 3.5–5.0)
Alkaline Phosphatase: 63 U/L (ref 38–126)
Anion gap: 7 (ref 5–15)
BUN: 11 mg/dL (ref 6–20)
CO2: 25 mmol/L (ref 22–32)
Calcium: 9 mg/dL (ref 8.9–10.3)
Chloride: 104 mmol/L (ref 98–111)
Creatinine, Ser: 0.57 mg/dL (ref 0.44–1.00)
GFR, Estimated: >60 mL/min (ref >60)
Glucose, Bld: 126 mg/dL — ABNORMAL HIGH (ref 70–99)
Potassium: 3.3 mmol/L — ABNORMAL LOW (ref 3.5–5.1)
Sodium: 136 mmol/L (ref 135–145)
Total Bilirubin: 0.5 mg/dL (ref 0.3–1.2)
Total Protein: 7.3 g/dL (ref 6.5–8.1)

## 2021-10-31 MED ORDER — HEPARIN SOD (PORK) LOCK FLUSH 100 UNIT/ML IV SOLN
500.0000 [IU] | Freq: Once | INTRAVENOUS | Status: AC | PRN
  Administered 2021-10-31: 500 [IU]
  Filled 2021-10-31: qty 5

## 2021-10-31 MED ORDER — SODIUM CHLORIDE 0.9 % IV SOLN
Freq: Once | INTRAVENOUS | Status: AC
  Filled 2021-10-31: qty 250

## 2021-10-31 MED ORDER — TRASTUZUMAB-ANNS CHEMO 150 MG IV SOLR
6.0000 mg/kg | Freq: Once | INTRAVENOUS | Status: AC
  Administered 2021-10-31: 399 mg via INTRAVENOUS
  Filled 2021-10-31: qty 19

## 2021-10-31 MED ORDER — POTASSIUM CHLORIDE CRYS ER 20 MEQ PO TBCR
20.0000 meq | EXTENDED_RELEASE_TABLET | Freq: Every day | ORAL | 0 refills | Status: DC
  Filled 2021-10-31: qty 30, 30d supply, fill #0

## 2021-10-31 MED ORDER — ACETAMINOPHEN 325 MG PO TABS
650.0000 mg | ORAL_TABLET | Freq: Once | ORAL | Status: AC
  Administered 2021-10-31: 650 mg via ORAL

## 2021-10-31 MED ORDER — DIPHENHYDRAMINE HCL 50 MG/ML IJ SOLN
50.0000 mg | Freq: Once | INTRAMUSCULAR | Status: AC
  Administered 2021-10-31: 50 mg via INTRAVENOUS

## 2021-10-31 NOTE — Progress Notes (Signed)
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  History obtained with the help of an interpreter.Patient self palpated left breast mass which was followed by a diagnostic bilateral mammogram.  Mammogram showed 3.1 x 2.9 x 1.9 cm hypoechoic mass at the 1 o'clock position of the left breast.  For abnormal cortically thickened left axillary lymph nodes measuring up to 5 mm.  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  Patient was also having ongoing back pain and was seen by [facility] orthopedics Dr. Doyle Askew who ordered MRI lumbar spine without contrast which showed possible pathologic fractures of L1 and L4 vertebral bodies with greater than 50% height loss at L1 and abnormal signal involving L2-L3 S1 as well as right iliac bone concerning for metastatic disease.  Patient is a single mother of 3 adult children and is very anxious today.  She reports significant back pain which radiates to her bilateral thighs.  Denies any focal tingling numbness or weakness.  Denies any bowel bladder incontinence.  Pain has been uncontrolled despite taking Tylenol.  No prior history of abnormal breast biopsies.   No family history of breast cancer   PET and MRI showed 3 areas of pathologic fracture of her spine as well as widespread bony metastatic disease and concern for impending fracture of the right hip.  Given her worsening pain she was asked to come to the ER.  She has been evaluated by Dr. Rudene Christians from orthopedic surgery and underwent kyphoplasty at 3 different levels. T6 L1 and L4 along with radiofrequency ablation.    She also underwent prophylactic fixation of the right hip and not affected the sacral region.     Patient received first dose of Herceptin and Perjeta on 09/04/2019.  Patient initially received Taxol and Herceptin perjeta.  Presently patient is on maintenance Herceptin and Perjeta alone.  Patient found to have brain mets in November 2022 for which she received whole brain radiation treatment but there was no evidence of systemic progression.  She was continued on Herceptin and Perjeta.  Repeat MRI in June 2023 showed slight progression of brain mets and plan is to switch her to Herceptin tucatinib Xeloda    Interval history-patient returns to clinic for evaluation and consideration of hercpetin. She reports headaches which are chronic and intermittent. She has been seen by [name] borders in [facility] for jaw pain which has now improved, nearly resolved. Denies any new complaints at this time  ECOG PS- 1 Pain scale- 3 Opioid associated constipation- no  Review of systems- Review of Systems  Constitutional:  Positive for malaise/fatigue. Negative for chills, fever and weight loss.  HENT:  Negative for hearing loss, nosebleeds, sore throat and tinnitus.   Eyes:  Negative for blurred  vision and double vision.  Respiratory:  Negative for cough, hemoptysis, shortness of breath and wheezing.   Cardiovascular:  Negative for chest pain, palpitations and leg swelling.  Gastrointestinal:  Negative for abdominal pain, blood in stool, constipation, diarrhea, melena, nausea and vomiting.  Genitourinary:   Negative for dysuria and urgency.  Musculoskeletal:  Positive for joint pain. Negative for back pain, falls and myalgias.  Skin:  Negative for itching and rash.  Neurological:  Positive for headaches. Negative for dizziness, tingling, sensory change, loss of consciousness and weakness.  Endo/Heme/Allergies:  Negative for environmental allergies. Does not bruise/bleed easily.  Psychiatric/Behavioral:  Negative for depression. The patient is not nervous/anxious and does not have insomnia.       No Known Allergies   Past Medical History:  Diagnosis Date   Anxiety    Breast cancer ([place])    with mets   Cancer ([place])    Colitis    COVID-19 in immunocompromised patient ([place])    Family history of colon cancer    Vertigo      Past Surgical History:  Procedure Laterality Date   BREAST BIOPSY Left 08/14/2019   [place] bx of mass, coil marker, path pending   BREAST BIOPSY Left 08/14/2019   [place] bx of LN, hydromarker, path pending   BREAST BIOPSY Left 08/14/2019   affirm bx of calcs, x marker, path pending   ESOPHAGOGASTRODUODENOSCOPY (EGD) WITH PROPOFOL N/A 10/05/2019   Procedure: ESOPHAGOGASTRODUODENOSCOPY (EGD) WITH PROPOFOL;  Surgeon: Lin Landsman, MD;  Location: [facility];  Service: Gastroenterology;  Laterality: N/A;   FLEXIBLE SIGMOIDOSCOPY N/A 10/05/2019   Procedure: FLEXIBLE SIGMOIDOSCOPY;  Surgeon: Lin Landsman, MD;  Location: [facility];  Service: Gastroenterology;  Laterality: N/A;   INTRAMEDULLARY (IM) NAIL INTERTROCHANTERIC Right 09/01/2019   Procedure: INTRAMEDULLARY (IM) NAIL INTERTROCHANTRIC AND RADIOFREQUENCY ABLATION;  Surgeon: Hessie Knows, MD;  Location: [facility];  Service: Orthopedics;  Laterality: Right;   KYPHOPLASTY N/A 08/29/2019   Procedure: KYPHOPLASTY T6, L1,L4 ,  RADIOFREQUENCY ABLATION;  Surgeon: Hessie Knows, MD;  Location: [facility];  Service: Orthopedics;  Laterality: N/A;   KYPHOPLASTY Right 09/01/2019   Procedure: Right Sacral Radiofrequency  Ablation and Cement Augmentation, Right sacrum and iliac crest;  Surgeon: Hessie Knows, MD;  Location: [facility];  Service: Orthopedics;  Laterality: Right;   PORTA CATH INSERTION N/A 08/28/2019   Procedure: PORTA CATH INSERTION;  Surgeon: Algernon Huxley, MD;  Location: [facility];  Service: Cardiovascular;  Laterality: N/A;    Social History   Socioeconomic History   Marital status: Single    Spouse name: Not on file   Number of children: Not on file   Years of education: Not on file   Highest education level: Not on file  Occupational History   Not on file  Tobacco Use   Smoking status: Never   Smokeless tobacco: Never  Vaping Use   Vaping Use: Never used  Substance and Sexual Activity   Alcohol use: Not Currently   Drug use: Not Currently   Sexual activity: Not Currently    Birth control/protection: None  Other Topics Concern   Not on file  Social History Narrative   Lives at home with children   Social Determinants of Health   Financial Resource Strain: Not on file  Food Insecurity: Not on file  Transportation Needs: No Transportation Needs (08/05/2021)   PRAPARE - Hydrologist (Medical): No    Lack of Transportation (Non-Medical): No  Physical  Activity: Not on file  Stress: Not on file  Social Connections: Not on file  Intimate Partner Violence: Not on file    Family History  Problem Relation Age of Onset   Colon cancer Maternal Uncle      Current Outpatient Medications:    albuterol (VENTOLIN HFA) 108 (90 Base) MCG/ACT inhaler, Inhale 2 puffs into the lungs every 6 (six) hours as needed for wheezing or shortness of breath., Disp: 8.5 g, Rfl: 2   benzoyl peroxide-erythromycin (BENZAMYCIN) gel, Apply topically 2 (two) times daily. (Patient not taking: Reported on 09/09/2021), Disp: 46.6 g, Rfl: 0   butalbital-acetaminophen-caffeine (FIORICET) 50-325-40 MG tablet, Take 1-2 tablets by mouth every 8 (eight) hours as needed for  headache. (Patient not taking: Reported on 09/09/2021), Disp: 30 tablet, Rfl: 0   Calcium Carb-Cholecalciferol 600-10 MG-MCG TABS, Take 1 tablet by mouth 2 (two) times daily., Disp: 180 tablet, Rfl: 0   capecitabine (XELODA) 500 MG tablet, Take 3 tablets (1,500 mg total) by mouth 2 (two) times daily after a meal. Take for 14 days, then hold for 7 days. Repeat every 21 days., Disp: 84 tablet, Rfl: 1   diphenoxylate-atropine (LOMOTIL) 2.5-0.025 MG tablet, Take 1 tablet by mouth 4 (four) times daily as needed for diarrhea or loose stools., Disp: 40 tablet, Rfl: 0   DULoxetine (CYMBALTA) 60 MG capsule, Take 1 capsule (60 mg total) by mouth daily., Disp: 30 capsule, Rfl: 3   gabapentin (NEURONTIN) 600 MG tablet, Take 1 tablet (600 mg total) by mouth 3 (three) times daily., Disp: 90 tablet, Rfl: 3   lidocaine-prilocaine (EMLA) cream, Apply 1 application topically as needed. Apply small amount to port site at least 1 hour prior to it being accessed, cover with plastic wrap, Disp: 30 g, Rfl: 3   LORazepam (ATIVAN) 0.5 MG tablet, Take 1 tablet (0.5 mg total) by mouth every 6 (six) hours as needed for anxiety. AS NEEDED FOR NAUSEA (Patient not taking: Reported on 08/05/2021), Disp: 45 tablet, Rfl: 0   morphine (MS CONTIN) 15 MG 12 hr tablet, Take 1 tablet (15 mg total) by mouth every 8 (eight) hours., Disp: 90 tablet, Rfl: 0   naloxone (NARCAN) nasal spray 4 mg/0.1 mL, Administer one spray in nostril for opioid overdose. If unresponsive after 1 dose, may repeat a dose every 2-3 minutes in alternating nostrils until emergency services arrive. (Patient not taking: Reported on 10/07/2021), Disp: 2 each, Rfl: 3   OLANZapine (ZYPREXA) 10 MG tablet, Take 1 tablet (10 mg total) by mouth at bedtime as needed (nausea)., Disp: 30 tablet, Rfl: 2   omeprazole (PRILOSEC) 20 MG capsule, Take 1 capsule (20 mg total) by mouth daily., Disp: 30 capsule, Rfl: 3   ondansetron (ZOFRAN) 8 MG tablet, Take 1 tablet (8 mg total) by mouth  every 8 (eight) hours as needed for nausea or vomiting., Disp: 45 tablet, Rfl: 2   Oxycodone HCl 10 MG TABS, Take 1/2 to 1 tablet (5-10 mg total) by mouth every 4 (four) hours as needed., Disp: 45 tablet, Rfl: 0   polyethylene glycol powder (MIRALAX) 17 GM/SCOOP powder, Take 17 g by mouth daily as needed., Disp: 238 g, Rfl: 3   tucatinib (TUKYSA) 150 MG tablet, Take 2 tablets (300 mg total) by mouth 2 (two) times daily., Disp: 120 tablet, Rfl: 1 No current facility-administered medications for this visit.  Facility-Administered Medications Ordered in Other Visits:    0.9 %  sodium chloride infusion, , Intravenous, Continuous, Burns, Wandra Feinstein, NP, Stopped at  06/02/20 1453   heparin lock flush 100 unit/mL, 500 Units, Intravenous, Once, Sindy Guadeloupe, MD   heparin lock flush 100 unit/mL, 500 Units, Intravenous, Once, Sindy Guadeloupe, MD   sodium chloride flush (NS) 0.9 % injection 10 mL, 10 mL, Intravenous, Once, Sindy Guadeloupe, MD   sodium chloride flush (NS) 0.9 % injection 10 mL, 10 mL, Intravenous, Once, Sindy Guadeloupe, MD  Physical exam:  Vitals:   10/31/21 0946  BP: 115/72  Pulse: 71  Resp: 16  Temp: 98.3 F (36.8 C)  TempSrc: Tympanic  SpO2: 100%  Weight: 139 lb (63 kg)   Physical Exam Vitals and nursing note reviewed.  Constitutional:      Appearance: She is ill-appearing.  Eyes:     Extraocular Movements: Extraocular movements intact.  Cardiovascular:     Rate and Rhythm: Normal rate and regular rhythm.     Heart sounds: Normal heart sounds.  Pulmonary:     Effort: Pulmonary effort is normal.     Breath sounds: Normal breath sounds.  Skin:    General: Skin is warm and dry.  Neurological:     Mental Status: She is alert and oriented to person, place, and time. Mental status is at baseline.  Psychiatric:        Mood and Affect: Mood normal.        Behavior: Behavior normal.     Comments: Flat affect         Latest Ref Rng & Units 10/31/2021    9:27 AM  CMP   Glucose 70 - 99 mg/dL 126   BUN 6 - 20 mg/dL 11   Creatinine 0.44 - 1.00 mg/dL 0.57   Sodium 135 - 145 mmol/L 136   Potassium 3.5 - 5.1 mmol/L 3.3   Chloride 98 - 111 mmol/L 104   CO2 22 - 32 mmol/L 25   Calcium 8.9 - 10.3 mg/dL 9.0   Total Protein 6.5 - 8.1 g/dL 7.3   Total Bilirubin 0.3 - 1.2 mg/dL 0.5   Alkaline Phos 38 - 126 U/L 63   AST 15 - 41 U/L 39   ALT 0 - 44 U/L 33       Latest Ref Rng & Units 10/31/2021    9:27 AM  CBC  WBC 4.0 - 10.5 K/uL 4.4   Hemoglobin 12.0 - 15.0 g/dL 12.4   Hematocrit 36.0 - 46.0 % 38.0   Platelets 150 - 400 K/uL 246      Assessment and plan- Patient is a 41 y.o. female with history of  ER negative HER2 positive metastatic breast cancer with bone and brain metastases here for on treatment assessment prior to next cycle of Herceptin and follow up for Xeloda 1500 mg twice daily 2 weeks on 1 week off and tucatinib 300 mg twice daily until progression or toxicity. Labs reviewed and acceptable for treatment. Proceed with trastuzumab today.   Tolerating treatment well. No significant side effects. Headache is chronic and intermittent.   Cancer related pain- improved with use of long acting morphine and short acting oxycodone. Denies OIC.   Hypokalemia- K 3.3 today. Start Kdur 20 meq daily. Follow up with Dr. Janese Banks.   Port-a-cath: functioning well.   She will follow up with Dr. Janese Banks or rtc in interim as needed.    Visit Diagnosis 1. Primary malignant neoplasm of breast with metastasis ([place])   2. Encounter for monoclonal antibody treatment for malignancy   3. Neoplasm related pain  Beckey Rutter, DNP, AGNP-C [place] at [facility] [phone] (clinic) 10/31/2021

## 2021-10-31 NOTE — Progress Notes (Signed)
Returns for follow-up. Pt reports that she has a headache this morning and states that jaw pain has improved.

## 2021-10-31 NOTE — Progress Notes (Signed)
Oral Chemotherapy Clinic [facility]  Telephone:([phone] [phone]  Patient Care Team: Creig Hines, MD as PCP - General (Oncology) Jim Like, RN as Registered Nurse Scarlett Presto, RN (Inactive) as Registered Nurse Creig Hines, MD as Consulting Physician (Hematology and Oncology)   Name of the patient: Sophia [name]  [id number]  1980-05-23   Date of visit: 10/31/21  HPI: Patient is a 41 y.o. female with ER/PR-, HER2+ metastatic breast cancer. She is currently being treated with Tukysa (tucatinib), Xeloda (capecitabine), and trastuzumab. She began this regimen on 10/07/21.   Reason for Consult: Oral chemotherapy follow-up for capecitabine and tucatinib therapy.   PAST MEDICAL HISTORY: Past Medical History:  Diagnosis Date   Anxiety    Breast cancer (HCC)    with mets   Cancer (HCC)    Colitis    COVID-19 in immunocompromised patient [facility])    Family history of colon cancer    Vertigo     HEMATOLOGY/ONCOLOGY HISTORY:  Oncology History  Primary cancer of left breast with metastasis to other site [facility])  08/28/2019 Initial Diagnosis   Primary cancer of left breast with metastasis to other site [facility])   01/21/2021 Cancer Staging   Staging form: Breast, AJCC 8th Edition - Clinical stage from 01/21/2021: Stage IV (cT2, cN1, cM1, G2, ER-, PR-, HER2+) - Signed by Creig Hines, MD on 01/21/2021 Histologic grading system: 3 grade system   Primary malignant neoplasm of breast with metastasis (HCC)  08/29/2019 Initial Diagnosis   Metastatic breast cancer (HCC)   09/04/2019 -  Chemotherapy   Patient is on Treatment Plan : BREAST Weekly Paclitaxel + Trastuzumab + Pertuzumab q21d      Genetic Testing   Negative genetic testing. No pathogenic variants identified on the [facility] CancerNext-Expanded+RNA panel. The report date is 04/13/2020.   The CancerNext-Expanded + RNAinsight gene panel offered by [employer] and includes sequencing  and rearrangement analysis for the following 77 genes: IP, ALK, APC*, ATM*, AXIN2, BAP1, BARD1, BLM, BMPR1A, BRCA1*, BRCA2*, BRIP1*, CDC73, CDH1*,CDK4, CDKN1B, CDKN2A, CHEK2*, CTNNA1, DICER1, FANCC, FH, FLCN, GALNT12, KIF1B, LZTR1, MAX, MEN1, MET, MLH1*, MSH2*, MSH3, MSH6*, MUTYH*, NBN, NF1*, NF2, NTHL1, PALB2*, PHOX2B, PMS2*, POT1, PRKAR1A, PTCH1, PTEN*, RAD51C*, RAD51D*,RB1, RECQL, RET, SDHA, SDHAF2, SDHB, SDHC, SDHD, SMAD4, SMARCA4, SMARCB1, SMARCE1, STK11, SUFU, TMEM127, TP53*,TSC1, TSC2, VHL and XRCC2 (sequencing and deletion/duplication); EGFR, EGLN1, HOXB13, KIT, MITF, PDGFRA, POLD1 and POLE (sequencing only); EPCAM and GREM1 (deletion/duplication only). DNA and RNA analyses performed for * genes.     ALLERGIES:  has No Known Allergies.  MEDICATIONS:  Current Outpatient Medications  Medication Sig Dispense Refill   albuterol (VENTOLIN HFA) 108 (90 Base) MCG/ACT inhaler Inhale 2 puffs into the lungs every 6 (six) hours as needed for wheezing or shortness of breath. 8.5 g 2   benzoyl peroxide-erythromycin (BENZAMYCIN) gel Apply topically 2 (two) times daily. (Patient not taking: Reported on 09/09/2021) 46.6 g 0   butalbital-acetaminophen-caffeine (FIORICET) 50-325-40 MG tablet Take 1-2 tablets by mouth every 8 (eight) hours as needed for headache. (Patient not taking: Reported on 09/09/2021) 30 tablet 0   Calcium Carb-Cholecalciferol 600-10 MG-MCG TABS Take 1 tablet by mouth 2 (two) times daily. 180 tablet 0   capecitabine (XELODA) 500 MG tablet Take 3 tablets (1,500 mg total) by mouth 2 (two) times daily after a meal. Take for 14 days, then hold for 7 days. Repeat every 21 days. 84 tablet 1   diphenoxylate-atropine (LOMOTIL) 2.5-0.025 MG tablet Take 1 tablet  by mouth 4 (four) times daily as needed for diarrhea or loose stools. 40 tablet 0   DULoxetine (CYMBALTA) 60 MG capsule Take 1 capsule (60 mg total) by mouth daily. 30 capsule 3   gabapentin (NEURONTIN) 600 MG tablet Take 1 tablet (600 mg  total) by mouth 3 (three) times daily. 90 tablet 3   lidocaine-prilocaine (EMLA) cream Apply 1 application topically as needed. Apply small amount to port site at least 1 hour prior to it being accessed, cover with plastic wrap 30 g 3   LORazepam (ATIVAN) 0.5 MG tablet Take 1 tablet (0.5 mg total) by mouth every 6 (six) hours as needed for anxiety. AS NEEDED FOR NAUSEA (Patient not taking: Reported on 08/05/2021) 45 tablet 0   morphine (MS CONTIN) 15 MG 12 hr tablet Take 1 tablet (15 mg total) by mouth every 8 (eight) hours. 90 tablet 0   naloxone (NARCAN) nasal spray 4 mg/0.1 mL Administer one spray in nostril for opioid overdose. If unresponsive after 1 dose, may repeat a dose every 2-3 minutes in alternating nostrils until emergency services arrive. (Patient not taking: Reported on 10/07/2021) 2 each 3   OLANZapine (ZYPREXA) 10 MG tablet Take 1 tablet (10 mg total) by mouth at bedtime as needed (nausea). 30 tablet 2   omeprazole (PRILOSEC) 20 MG capsule Take 1 capsule (20 mg total) by mouth daily. 30 capsule 3   ondansetron (ZOFRAN) 8 MG tablet Take 1 tablet (8 mg total) by mouth every 8 (eight) hours as needed for nausea or vomiting. 45 tablet 2   Oxycodone HCl 10 MG TABS Take 1/2 to 1 tablet (5-10 mg total) by mouth every 4 (four) hours as needed. 45 tablet 0   polyethylene glycol powder (MIRALAX) 17 GM/SCOOP powder Take 17 g by mouth daily as needed. 238 g 3   tucatinib (TUKYSA) 150 MG tablet Take 2 tablets (300 mg total) by mouth 2 (two) times daily. 120 tablet 1   No current facility-administered medications for this visit.   Facility-Administered Medications Ordered in Other Visits  Medication Dose Route Frequency Provider Last Rate Last Admin   0.9 %  sodium chloride infusion   Intravenous Continuous Jacquelin Hawking, NP   Stopped at 06/02/20 1453   heparin lock flush 100 unit/mL  500 Units Intravenous Once Sindy Guadeloupe, MD       heparin lock flush 100 unit/mL  500 Units Intravenous Once  Sindy Guadeloupe, MD       sodium chloride flush (NS) 0.9 % injection 10 mL  10 mL Intravenous Once Sindy Guadeloupe, MD       sodium chloride flush (NS) 0.9 % injection 10 mL  10 mL Intravenous Once Sindy Guadeloupe, MD        VITAL SIGNS: There were no vitals taken for this visit. There were no vitals filed for this visit.  Estimated body mass index is 25.42 kg/m as calculated from the following:   Height as of 09/16/21: $RemoveBef'5\' 2"'oDqENQREJP$  (1.575 m).   Weight as of an earlier encounter on 10/31/21: 63 kg (139 lb).  LABS: CBC:    Component Value Date/Time   WBC 4.4 10/31/2021 0927   HGB 12.4 10/31/2021 0927   HCT 38.0 10/31/2021 0927   PLT 246 10/31/2021 0927   MCV 87.6 10/31/2021 0927   NEUTROABS 2.6 10/31/2021 0927   LYMPHSABS 1.3 10/31/2021 0927   MONOABS 0.3 10/31/2021 0927   EOSABS 0.1 10/31/2021 0927   BASOSABS 0.0 10/31/2021 8850  Comprehensive Metabolic Panel:    Component Value Date/Time   NA 136 10/31/2021 0927   K 3.3 (L) 10/31/2021 0927   CL 104 10/31/2021 0927   CO2 25 10/31/2021 0927   BUN 11 10/31/2021 0927   CREATININE 0.57 10/31/2021 0927   GLUCOSE 126 (H) 10/31/2021 0927   CALCIUM 9.0 10/31/2021 0927   AST 39 10/31/2021 0927   ALT 33 10/31/2021 0927   ALKPHOS 63 10/31/2021 0927   BILITOT 0.5 10/31/2021 0927   PROT 7.3 10/31/2021 0927   ALBUMIN 3.9 10/31/2021 0927     Present during today's visit: patient and interpreter  Assessment and Plan: CMP/CBC reviewed, continue capecitabine and tucatinib   Oral Chemotherapy Side Effect/Intolerance:  Nausea: Patient reports nausea that she is able to manage effectively with premedication her oral chemo with antiemetics and using prn antiemetics No reported hand-foot syndrome, diarrhea, edema, rash, or mouth sores  Oral Chemotherapy Adherence: no missed doses reported No patient barriers to medication adherence identified.   New medications: No new medication  Medication Access Issues: No access issues  Patient  expressed understanding and was in agreement with this plan. She also understands that She can call clinic at any time with any questions, concerns, or complaints.   Follow-up plan: RTC in 3 weeks  Thank you for allowing me to participate in the care of this very pleasant patient.   Time Total: 15 mins  Visit consisted of counseling and education on dealing with issues of symptom management in the setting of serious and potentially life-threatening illness.Greater than 50%  of this time was spent counseling and coordinating care related to the above assessment and plan.  Signed by: Darl Pikes, PharmD, BCPS, Salley Slaughter, CPP Hematology/Oncology Clinical Pharmacist Practitioner [place]/DB/AP Oral [facility] [phone]  10/31/2021 3:23 PM

## 2021-11-01 ENCOUNTER — Encounter
Admission: RE | Admit: 2021-11-01 | Discharge: 2021-11-01 | Disposition: A | Discharge status: Home / Self Care | Payer: Self-pay | Source: Ambulatory Visit | Attending: Oncology | Admitting: Oncology

## 2021-11-01 ENCOUNTER — Ambulatory Visit
Admission: RE | Admit: 2021-11-01 | Discharge: 2021-11-01 | Disposition: A | Discharge status: Home / Self Care | Payer: Self-pay | Source: Ambulatory Visit | Attending: Oncology | Admitting: Oncology

## 2021-11-01 ENCOUNTER — Telehealth: Payer: Self-pay

## 2021-11-01 ENCOUNTER — Ambulatory Visit: Payer: Self-pay

## 2021-11-01 ENCOUNTER — Other Ambulatory Visit: Payer: Self-pay

## 2021-11-01 DIAGNOSIS — C50919 Malignant neoplasm of unspecified site of unspecified female breast: Secondary | ICD-10-CM

## 2021-11-01 DIAGNOSIS — C7951 Secondary malignant neoplasm of bone: Secondary | ICD-10-CM | POA: Insufficient documentation

## 2021-11-01 DIAGNOSIS — C50912 Malignant neoplasm of unspecified site of left female breast: Secondary | ICD-10-CM | POA: Insufficient documentation

## 2021-11-01 MED ORDER — TECHNETIUM TC 99M MEDRONATE IV KIT
20.0000 | PACK | Freq: Once | INTRAVENOUS | Status: AC | PRN
  Administered 2021-11-01: 20.67 via INTRAVENOUS

## 2021-11-01 MED ORDER — IOHEXOL 300 MG/ML  SOLN
100.0000 mL | Freq: Once | INTRAMUSCULAR | Status: AC | PRN
  Administered 2021-11-01: 100 mL via INTRAVENOUS

## 2021-11-01 NOTE — Telephone Encounter (Signed)
Pt is aware that her potassium is low and a rx was sent to [facility] employee pharmacy. Pt understands and agrees.

## 2021-11-09 ENCOUNTER — Other Ambulatory Visit: Payer: Self-pay | Admitting: Oncology

## 2021-11-09 ENCOUNTER — Other Ambulatory Visit (HOSPITAL_COMMUNITY): Payer: Self-pay

## 2021-11-09 DIAGNOSIS — C50919 Malignant neoplasm of unspecified site of unspecified female breast: Secondary | ICD-10-CM

## 2021-11-09 MED ORDER — CAPECITABINE 500 MG PO TABS
1500.0000 mg | ORAL_TABLET | Freq: Two times a day (BID) | ORAL | 1 refills | Status: DC
  Filled 2021-11-09 – 2021-11-10 (×2): qty 84, 21d supply, fill #0
  Filled 2021-11-30: qty 84, 21d supply, fill #1

## 2021-11-09 NOTE — Telephone Encounter (Signed)
CBC with Differential Order: [id number] Status: Final result    Visible to patient: No (inaccessible in MyChart)    Next appt: 11/18/2021 at 08:30 AM in Oncology ([facility])    Dx: Primary malignant neoplasm of breast ...    0 Result Notes           Component Ref Range & Units 9 d ago (10/31/21) 2 wk ago (10/26/21) 2 wk ago (10/21/21) 3 wk ago (10/14/21) 1 mo ago (10/07/21) 1 mo ago (09/16/21) 2 mo ago (09/01/21)  WBC 4.0 - 10.5 K/uL 4.4  4.7  4.3  5.9  5.0  5.9  4.1   RBC 3.87 - 5.11 MIL/uL 4.34  4.49  4.31  4.56  4.48  4.72  5.10   Hemoglobin 12.0 - 15.0 g/dL 12.4  13.0  12.5  13.0  12.9  13.3  14.4   HCT 36.0 - 46.0 % 38.0  39.2  37.1  38.8  38.7  40.1  43.4   MCV 80.0 - 100.0 fL 87.6  87.3  86.1  85.1  86.4  85.0  85.1   MCH 26.0 - 34.0 pg 28.6  29.0  29.0  28.5  28.8  28.2  28.2   MCHC 30.0 - 36.0 g/dL 32.6  33.2  33.7  33.5  33.3  33.2  33.2   RDW 11.5 - 15.5 % 14.9  15.3  14.6  13.5  13.4  13.5  13.5   Platelets 150 - 400 K/uL 246  316  386  340  281  350  320   nRBC 0.0 - 0.2 % 0.0  0.0  0.0  0.0  0.0  0.0  0.0   Neutrophils Relative % % 59  60  53  73  60  65  58   Neutro Abs 1.7 - 7.7 K/uL 2.6  2.8  2.3  4.3  3.1  3.9  2.4   Lymphocytes Relative % 29  29  35  '21  31  27  29   '$ Lymphs Abs 0.7 - 4.0 K/uL 1.3  1.4  1.5  1.3  1.5  1.6  1.2   Monocytes Relative % '7  8  8  4  5  5  8   '$ Monocytes Absolute 0.1 - 1.0 K/uL 0.3  0.4  0.4  0.3  0.3  0.3  0.3   Eosinophils Relative % '3  2  3  1  2  3  4   '$ Eosinophils Absolute 0.0 - 0.5 K/uL 0.1  0.1  0.1  0.1  0.1  0.2  0.2   Basophils Relative % '1  1  1  1  1  '$ 0  1   Basophils Absolute 0.0 - 0.1 K/uL 0.0  0.0  0.0  0.0  0.0  0.0  0.0   Immature Granulocytes % 1  0  0  0  1  0  0   Abs Immature Granulocytes 0.00 - 0.07 K/uL 0.02  0.01 CM  0.01 CM  0.02 CM  0.03 CM  0.02 CM  0.01 CM   Comment: Performed at [facility], [address] [place], [place] [address]  Resulting Agency  [facility] CLIN LAB [place] [facility] [place] [facility] [place] [facility] LAB  [place] CL[facility] LAB [place] [facility] LAB [facility] CLIN LAB         Specimen Collected: 10/31/21 09:27 Last Resulted: 10/31/21 09:38      Lab Flowsheet    Order Details  Engineer, civil (consulting) History    View All Conversations on this Encounter      CM=Additional comments      Result Care Coordination   Patient Communication   Add Comments   Not seen Back to Top       Other Results from 10/31/2021   Contains abnormal data Comprehensive metabolic panel Order: [id number] Status: Final result    Visible to patient: No (inaccessible in MyChart)    Next appt: 11/18/2021 at 08:30 AM in Oncology ([facility])    Dx: Primary malignant neoplasm of breast ...    0 Result Notes           Component Ref Range & Units 9 d ago (10/31/21) 2 wk ago (10/21/21) 3 wk ago (10/14/21) 1 mo ago (10/07/21) 1 mo ago (09/16/21) 2 mo ago (09/01/21) 2 mo ago (08/26/21)  Sodium 135 - 145 mmol/L 136  137  137  138  137  136  138   Potassium 3.5 - 5.1 mmol/L 3.3 Low   3.7  3.3 Low   3.4 Low   3.6  3.9  3.5   Chloride 98 - 111 mmol/L 104  105  106  109  104  105  104   CO2 22 - 32 mmol/L '25  26  24  23  25  24  26   '$ Glucose, Bld 70 - 99 mg/dL 126 High   100 High  CM  161 High  CM  157 High  CM  129 High  CM  97 CM  156 High  CM   Comment: Glucose reference range applies only to samples taken after fasting for at least 8 hours.  BUN 6 - 20 mg/dL '11  8  8  11  10  13  10   '$ Creatinine, Ser 0.44 - 1.00 mg/dL 0.57  0.55  0.70  0.56  0.46  0.53  0.54   Calcium 8.9 - 10.3 mg/dL 9.0  8.7 Low   8.3 Low   8.8 Low   8.7 Low   8.6 Low   8.9   Total Protein 6.5 - 8.1 g/dL 7.3  7.6  7.7  7.2  7.5  8.0  7.6   Albumin 3.5 - 5.0 g/dL 3.9  4.0  4.2  3.6  3.9  4.0  3.8   AST 15 - 41 U/L 39  41  67 High   '29  26  31  '$ 35   ALT 0 - 44 U/L 33  55 High   91 High   '23  17  26  22   '$ Alkaline Phosphatase 38 - 126 U/L 63  68  84  74  71  82  73   Total Bilirubin 0.3 - 1.2 mg/dL 0.5  0.5  0.9  0.7  0.7   0.9  0.6   GFR, Estimated >60 mL/min >60  >60 CM  >60 CM  >60 CM  >60 CM  >60 CM  >60 CM   Comment: (NOTE)  Calculated using the CKD-EPI Creatinine Equation (2021)   Anion gap 5 - '15 7  6 '$ CM  7 CM  6 CM  8 CM  7 CM  8 CM   Comment: Performed at [facility], [address] [place], [place] [address]  Resulting Agency  [place] CLIN LAB [place] CL[facility] LAB [place] CL[facility] LAB [place] CL[facility] LAB [place] CLIN  LAB [place] CLIN LAB [place] [facility] LAB         Specimen Collected: 10/31/21 09:27 Last Resulted: 10/31/21 10:00

## 2021-11-10 ENCOUNTER — Encounter: Payer: Self-pay | Admitting: Oncology

## 2021-11-10 ENCOUNTER — Other Ambulatory Visit (HOSPITAL_COMMUNITY): Payer: Self-pay

## 2021-11-11 ENCOUNTER — Other Ambulatory Visit: Payer: Self-pay | Admitting: Oncology

## 2021-11-11 DIAGNOSIS — M8440XA Pathological fracture, unspecified site, initial encounter for fracture: Secondary | ICD-10-CM

## 2021-11-11 DIAGNOSIS — C50919 Malignant neoplasm of unspecified site of unspecified female breast: Secondary | ICD-10-CM

## 2021-11-11 NOTE — Progress Notes (Signed)
DISCONTINUE ON PATHWAY REGIMEN - Breast     Cycle 1: A cycle is 21 days:     Pertuzumab      Trastuzumab-xxxx      Paclitaxel    Cycles 2 through 8: A cycle is every 21 days:     Pertuzumab      Trastuzumab-xxxx      Paclitaxel    Cycles 9 and beyond: A cycle is every 21 days:     Pertuzumab      Trastuzumab-xxxx   **Always confirm dose/schedule in your pharmacy ordering system**  REASON: Disease Progression PRIOR TREATMENT: OYD741: Paclitaxel (Weekly) + Trastuzumab IV + Pertuzumab IV q21 Days x 8 Cycles, Followed by Trastuzumab IV + Pertuzumab IV q21 Days TREATMENT RESPONSE: Progressive Disease (PD)  START ON PATHWAY REGIMEN - Breast     Cycle 1: A cycle is 21 days:     Capecitabine      Tucatinib      Trastuzumab-xxxx    Cycles 2 and beyond: A cycle is every 21 days:     Capecitabine      Tucatinib      Trastuzumab-xxxx   **Always confirm dose/schedule in your pharmacy ordering system**  Patient 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

## 2021-11-14 ENCOUNTER — Encounter: Payer: Self-pay | Admitting: Oncology

## 2021-11-14 ENCOUNTER — Other Ambulatory Visit: Payer: Self-pay

## 2021-11-14 MED ORDER — OXYCODONE HCL 10 MG PO TABS
5.0000 mg | ORAL_TABLET | ORAL | 0 refills | Status: DC | PRN
  Filled 2021-11-14: qty 45, 8d supply, fill #0

## 2021-11-14 MED ORDER — MORPHINE SULFATE ER 15 MG PO TBCR: 15.0000 mg | EXTENDED_RELEASE_TABLET | Freq: Three times a day (TID) | ORAL | 0 refills | Status: DC

## 2021-11-15 ENCOUNTER — Other Ambulatory Visit (HOSPITAL_COMMUNITY): Payer: Self-pay

## 2021-11-15 ENCOUNTER — Encounter: Payer: Self-pay | Admitting: Oncology

## 2021-11-17 ENCOUNTER — Other Ambulatory Visit (HOSPITAL_COMMUNITY): Payer: Self-pay

## 2021-11-18 ENCOUNTER — Encounter: Payer: Self-pay | Admitting: Oncology

## 2021-11-18 ENCOUNTER — Other Ambulatory Visit: Payer: Self-pay | Admitting: *Deleted

## 2021-11-18 ENCOUNTER — Other Ambulatory Visit: Payer: Self-pay

## 2021-11-18 ENCOUNTER — Inpatient Hospital Stay: Payer: Self-pay | Attending: Oncology

## 2021-11-18 ENCOUNTER — Inpatient Hospital Stay: Payer: Self-pay | Admitting: Licensed Clinical Social Worker

## 2021-11-18 ENCOUNTER — Inpatient Hospital Stay: Payer: Self-pay

## 2021-11-18 ENCOUNTER — Inpatient Hospital Stay (HOSPITAL_BASED_OUTPATIENT_CLINIC_OR_DEPARTMENT_OTHER): Payer: Self-pay | Admitting: Oncology

## 2021-11-18 VITALS — BP 96/66 | HR 60 | Temp 96.9°F | Resp 16

## 2021-11-18 VITALS — BP 108/71 | HR 70 | Temp 98.3°F | Resp 16 | Wt 143.5 lb

## 2021-11-18 DIAGNOSIS — C50919 Malignant neoplasm of unspecified site of unspecified female breast: Secondary | ICD-10-CM

## 2021-11-18 DIAGNOSIS — F419 Anxiety disorder, unspecified: Secondary | ICD-10-CM

## 2021-11-18 DIAGNOSIS — M8440XA Pathological fracture, unspecified site, initial encounter for fracture: Secondary | ICD-10-CM

## 2021-11-18 DIAGNOSIS — C7951 Secondary malignant neoplasm of bone: Secondary | ICD-10-CM

## 2021-11-18 DIAGNOSIS — C50412 Malignant neoplasm of upper-outer quadrant of left female breast: Secondary | ICD-10-CM | POA: Insufficient documentation

## 2021-11-18 DIAGNOSIS — Z5111 Encounter for antineoplastic chemotherapy: Secondary | ICD-10-CM

## 2021-11-18 DIAGNOSIS — Z171 Estrogen receptor negative status [ER-]: Secondary | ICD-10-CM | POA: Insufficient documentation

## 2021-11-18 DIAGNOSIS — Z5112 Encounter for antineoplastic immunotherapy: Secondary | ICD-10-CM | POA: Insufficient documentation

## 2021-11-18 DIAGNOSIS — G893 Neoplasm related pain (acute) (chronic): Secondary | ICD-10-CM

## 2021-11-18 DIAGNOSIS — C7931 Secondary malignant neoplasm of brain: Secondary | ICD-10-CM | POA: Insufficient documentation

## 2021-11-18 LAB — COMPREHENSIVE METABOLIC PANEL
ALT: 24 U/L (ref 0–44)
AST: 44 U/L — ABNORMAL HIGH (ref 15–41)
Albumin: 3.6 g/dL (ref 3.5–5.0)
Alkaline Phosphatase: 77 U/L (ref 38–126)
Anion gap: 9 (ref 5–15)
BUN: 10 mg/dL (ref 6–20)
CO2: 21 mmol/L — ABNORMAL LOW (ref 22–32)
Calcium: 8.3 mg/dL — ABNORMAL LOW (ref 8.9–10.3)
Chloride: 105 mmol/L (ref 98–111)
Creatinine, Ser: 0.59 mg/dL (ref 0.44–1.00)
GFR, Estimated: >60 mL/min (ref >60)
Glucose, Bld: 156 mg/dL — ABNORMAL HIGH (ref 70–99)
Potassium: 3.2 mmol/L — ABNORMAL LOW (ref 3.5–5.1)
Sodium: 135 mmol/L (ref 135–145)
Total Bilirubin: 0.3 mg/dL (ref 0.3–1.2)
Total Protein: 7.1 g/dL (ref 6.5–8.1)

## 2021-11-18 LAB — CBC WITH DIFFERENTIAL/PLATELET
Abs Immature Granulocytes: 0.01 10*3/uL (ref 0.00–0.07)
Basophils Absolute: 0 10*3/uL (ref 0.0–0.1)
Basophils Relative: 0 %
Eosinophils Absolute: 0.2 10*3/uL (ref 0.0–0.5)
Eosinophils Relative: 4 %
HCT: 37.3 % (ref 36.0–46.0)
Hemoglobin: 12.3 g/dL (ref 12.0–15.0)
Immature Granulocytes: 0 %
Lymphocytes Relative: 36 %
Lymphs Abs: 1.8 10*3/uL (ref 0.7–4.0)
MCH: 29.2 pg (ref 26.0–34.0)
MCHC: 33 g/dL (ref 30.0–36.0)
MCV: 88.6 fL (ref 80.0–100.0)
Monocytes Absolute: 0.3 10*3/uL (ref 0.1–1.0)
Monocytes Relative: 6 %
Neutro Abs: 2.6 10*3/uL (ref 1.7–7.7)
Neutrophils Relative %: 54 %
Platelets: 318 10*3/uL (ref 150–400)
RBC: 4.21 MIL/uL (ref 3.87–5.11)
RDW: 14.6 % (ref 11.5–15.5)
WBC: 4.9 10*3/uL (ref 4.0–10.5)
nRBC: 0 % (ref 0.0–0.2)

## 2021-11-18 MED ORDER — HEPARIN SOD (PORK) LOCK FLUSH 100 UNIT/ML IV SOLN
500.0000 [IU] | Freq: Once | INTRAVENOUS | Status: AC | PRN
  Administered 2021-11-18: 500 [IU]
  Filled 2021-11-18: qty 5

## 2021-11-18 MED ORDER — MORPHINE SULFATE ER 15 MG PO TBCR
15.0000 mg | EXTENDED_RELEASE_TABLET | Freq: Three times a day (TID) | ORAL | 0 refills | Status: DC
  Filled 2021-11-18: qty 90, 30d supply, fill #0

## 2021-11-18 MED ORDER — MEPERIDINE HCL 25 MG/ML IJ SOLN
25.0000 mg | Freq: Once | INTRAMUSCULAR | Status: AC
  Administered 2021-11-18: 25 mg via INTRAVENOUS
  Filled 2021-11-18: qty 1

## 2021-11-18 MED ORDER — SODIUM CHLORIDE 0.9% FLUSH
10.0000 mL | Freq: Once | INTRAVENOUS | Status: AC
  Administered 2021-11-18: 10 mL via INTRAVENOUS
  Filled 2021-11-18: qty 10

## 2021-11-18 MED ORDER — DIPHENHYDRAMINE HCL 25 MG PO CAPS
25.0000 mg | ORAL_CAPSULE | Freq: Once | ORAL | Status: AC
  Administered 2021-11-18: 25 mg via ORAL

## 2021-11-18 MED ORDER — SODIUM CHLORIDE 0.9 % IV SOLN
INTRAVENOUS | Status: DC
  Filled 2021-11-18 (×3): qty 250

## 2021-11-18 MED ORDER — TRASTUZUMAB-ANNS CHEMO 150 MG IV SOLR
6.0000 mg/kg | Freq: Once | INTRAVENOUS | Status: AC
  Administered 2021-11-18: 399 mg via INTRAVENOUS
  Filled 2021-11-18: qty 19

## 2021-11-18 MED ORDER — SODIUM CHLORIDE 0.9 % IV SOLN
Freq: Once | INTRAVENOUS | Status: AC
  Filled 2021-11-18: qty 250

## 2021-11-18 MED ORDER — POTASSIUM CHLORIDE IN NACL 20-0.9 MEQ/L-% IV SOLN
Freq: Once | INTRAVENOUS | Status: AC
  Filled 2021-11-18: qty 1000

## 2021-11-18 MED ORDER — DIPHENHYDRAMINE HCL 25 MG PO CAPS
50.0000 mg | ORAL_CAPSULE | Freq: Once | ORAL | Status: DC
  Filled 2021-11-18: qty 2

## 2021-11-18 MED ORDER — LORAZEPAM 0.5 MG PO TABS
0.5000 mg | ORAL_TABLET | Freq: Four times a day (QID) | ORAL | 0 refills | Status: DC | PRN
  Filled 2021-11-18: qty 45, 12d supply, fill #0

## 2021-11-18 MED ORDER — TRASTUZUMAB-DKST CHEMO 150 MG IV SOLR: 6.0000 mg/kg | Freq: Once | INTRAVENOUS | Status: DC

## 2021-11-18 MED ORDER — ACETAMINOPHEN 325 MG PO TABS
650.0000 mg | ORAL_TABLET | Freq: Once | ORAL | Status: AC
  Administered 2021-11-18: 650 mg via ORAL
  Filled 2021-11-18: qty 2

## 2021-11-18 NOTE — Progress Notes (Signed)
Patient received Tylenol 650 mg and Benadryl 25 mg PO prior to her Kanjinti, per Dr. Elroy Channel orders, along with 20 meq of KCL over 1 hour. Kanjinti given as ordered. About 5 minutes post completion of Kanjinti, (1205) patient started having rigors. Per Dr. Elroy Channel ordered, gave patient Demerol 25 mg IV @ 1215. At 1220, rigors were gone. Monitored patient for 1 hour post Demerol. Patient returned to baseline. Patient was discharged in stable condition.

## 2021-11-18 NOTE — Progress Notes (Signed)
ON PATHWAY REGIMEN - Breast  No Change  Continue With Treatment as Ordered.  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

## 2021-11-18 NOTE — Progress Notes (Signed)
Pt states she has been having more anxiety sxs.

## 2021-11-18 NOTE — Patient Instructions (Signed)
Instrucciones al darle de alta: Discharge Instructions Gracias por elegir al [facility] para brindarle atencin mdica de oncologa y hematologa.   Si usted tiene una cita de laboratorio con el [facility], por favor vaya directamente al [facility] y regstrese en el rea de registro.   Use ropa cmoda y adecuada para tener fcil acceso a las vas del Portacath (acceso venoso de larga duracin) o la lnea PICC (catter central colocado por va perifrica).   Nos esforzamos por ofrecerle tiempo de calidad con su proveedor. Es posible que tenga que volver a programar su cita si llega tarde (15 minutos o ms).  El llegar tarde le afecta a usted y a otros pacientes cuyas citas son posteriores a la suya.  Adems, si usted falta a tres o ms citas sin avisar a la oficina, puede ser retirado(a) de la clnica a discrecin del proveedor.      Para las solicitudes de renovacin de recetas, pida a su farmacia que se ponga en contacto con nuestra oficina y deje que transcurran 72 horas para que se complete el proceso de las renovaciones.     Para ayudar a prevenir las nuseas y los vmitos despus de su tratamiento, le recomendamos que tome su medicamento para las nuseas segn las indicaciones.  LOS SNTOMAS QUE DEBEN COMUNICARSE INMEDIATAMENTE SE INDICAN A CONTINUACIN: *FIEBRE SUPERIOR A 100.4 F (38 C) O MS *ESCALOFROS O SUDORACIN *NUSEAS Y VMITOS QUE NO SE CONTROLAN CON EL MEDICAMENTO PARA LAS NUSEAS *DIFICULTAD INUSUAL PARA RESPIRAR  *MORETONES O HEMORRAGIAS NO HABITUALES *PROBLEMAS URINARIOS (dolor o ardor al orinar o frecuencia para orinar) *PROBLEMAS INTESTINALES (diarrea inusual, estreimiento, dolor cerca del ano) SENSIBILIDAD EN LA BOCA Y EN LA GARGANTA CON O SIN LA PRESENCIA DE LCERAS (dolor de garganta, llagas en la boca o dolor de muelas/dientes) ERUPCIN, HINCHAZN O DOLORES INUSUALES FLUJO VAGINAL INUSUAL O PICAZN/RASQUIA    Los puntos  marcados con un asterisco ( *) indican una posible emergencia y debe hacer un seguimiento tan pronto como le sea posible o vaya al Departamento de Emergencias si se le presenta algn problema.  Por favor, muestre la TARJETA DE ADVERTENCIA DE QUIMIOTERAPIA O LA TARJETA DE ADVERTENCIA DE INMUNOTERAPIA al registrarse en el Departamento de Emergencias y a la enfermera de triaje.  Si tiene preguntas despus de su visita o necesita cancelar o volver a programar su cita, por favor pngase en contacto con [facility]  [phone]  y siga las instrucciones. Las horas de oficina son de 8:00 a.m. a 4:30 p.m. de lunes a viernes. Por favor, tenga en cuenta que los mensajes de voz que se dejan despus de las 4:00 p.m. posiblemente no se devolvern hasta el siguiente da de trabajo.  Cerramos los fines de semana y los das festivos importantes. En todo momento tiene acceso a una enfermera para preguntas urgentes. Por favor, llame al nmero principal de la clnica  [phone] y siga las instrucciones.   Para cualquier pregunta que no sea de carcter urgente, tambin puede ponerse en contacto con su proveedor utilizando MyChart. Ahora ofrecemos visitas electrnicas para cualquier persona mayor de 18 aos que solicite atencin mdica en lnea para los sntomas que no sean urgentes. Para ms detalles vaya a [email or web address].   Tambin puede bajar la aplicacin de MyChart! Vaya a la tienda de aplicaciones, busque "MyChart", abra la aplicacin, seleccione [place], e ingrese con su nombre de usuario y la contrasea de MyChart.    Las mscaras son opcionales en los centros de cncer. Si desea que su equipo de cuidados mdicos use una mscara mientras le atienden, por favor hgaselo saber al personal. Puede tener una persona de apoyo que tenga por lo menos 16 aos para que le acompae a sus citas. 

## 2021-11-18 NOTE — Progress Notes (Signed)
[place] CSW Progress Note  Holiday representative met with patient to discussed current housing situation.  Patient is unhoused and living with her three children in a friend's sunroom.  Patient does not have any source of income and neither of her teenage sons are working at the moment.  Patient does not qualify for financial assistance. Patient stated she went to the [employer] and she was told she didn't meet the criteria, she also went to the [employer] and was initially told she didn't meet criteria, she them told the representative her situation and the patient stated she was told by the housing authority representative she would be out on a waiting list, the average waiting list is 6-12 months.  Due to patient's limited criteria for assistance, government, assistance is not an options, patient has also been resistant to recommendations on how to find different sources of income.  Patient stated she would contact CSW if there were any chances to her circumstances.    Adelene Amas, LCSW

## 2021-11-18 NOTE — Progress Notes (Signed)
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  History obtained with the help of an interpreter.Patient self palpated left breast mass which was followed by a diagnostic bilateral mammogram.  Mammogram showed 3.1 x 2.9 x 1.9 cm hypoechoic mass at the 1 o'clock position of the left breast.  For abnormal cortically thickened left axillary lymph nodes measuring up to 5 mm.  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  Patient was also having ongoing back pain and was seen by [facility] orthopedics Dr. Doyle Askew who ordered MRI lumbar spine without contrast which showed possible pathologic fractures of L1 and L4 vertebral bodies with greater than 50% height loss at L1 and abnormal signal involving L2-L3 S1 as well as right iliac bone concerning for metastatic disease.  Patient is a single mother of 3 adult children and is very anxious today.  She reports significant back pain which radiates to her bilateral thighs.  Denies any focal tingling numbness or weakness.  Denies any bowel bladder incontinence.  Pain has been uncontrolled despite taking Tylenol.  No prior history of abnormal breast biopsies.  No  family history of breast cancer   PET and MRI showed 3 areas of pathologic fracture of her spine as well as widespread bony metastatic disease and concern for impending fracture of the right hip.  Given her worsening pain she was asked to come to the ER.  She has been evaluated by Dr. Rudene Christians from orthopedic surgery and underwent kyphoplasty at 3 different levels. T6 L1 and L4 along with radiofrequency ablation.    She also underwent prophylactic fixation of the right hip and not affected the sacral region.     Patient received first dose of Herceptin and Perjeta on 09/04/2019.  Patient initially received Taxol and Herceptin perjeta.  Presently patient is on maintenance Herceptin and Perjeta alone.  Patient found to have brain mets in November 2022 for which she received whole brain radiation treatment but there was no evidence of systemic progression.  She was continued on Herceptin and Perjeta.  Repeat MRI in June 2023 showed slight progression of brain mets and plan is to switch her to Herceptin tucatinib Xeloda  Interval history-patient reports occasional epigastric pain after starting Xeloda infigratinib.  Overall pain is self-limited.  She reports ongoing anxiety which is not well controlled.  ECOG PS- 1 Pain scale- 3 Opioid associated constipation- no  Review of systems- Review of Systems  Constitutional:  Positive for malaise/fatigue.  Musculoskeletal:  Positive for joint pain.  Psychiatric/Behavioral:  The patient is nervous/anxious.       No Known Allergies   Past Medical History:  Diagnosis Date   Anxiety    Breast cancer ([place])    with mets   Cancer (  [place])    Colitis    COVID-19 in immunocompromised patient [facility])    Family history of colon cancer    Vertigo      Past Surgical History:  Procedure Laterality Date   BREAST BIOPSY Left 08/14/2019   [place] bx of mass, coil marker, path pending   BREAST BIOPSY Left 08/14/2019   [place] bx of LN, hydromarker, path pending   BREAST  BIOPSY Left 08/14/2019   affirm bx of calcs, x marker, path pending   ESOPHAGOGASTRODUODENOSCOPY (EGD) WITH PROPOFOL N/A 10/05/2019   Procedure: ESOPHAGOGASTRODUODENOSCOPY (EGD) WITH PROPOFOL;  Surgeon: Lin Landsman, MD;  Location: [facility];  Service: Gastroenterology;  Laterality: N/A;   FLEXIBLE SIGMOIDOSCOPY N/A 10/05/2019   Procedure: FLEXIBLE SIGMOIDOSCOPY;  Surgeon: Lin Landsman, MD;  Location: [facility] ENDOSCOPY;  Service: Gastroenterology;  Laterality: N/A;   INTRAMEDULLARY (IM) NAIL INTERTROCHANTERIC Right 09/01/2019   Procedure: INTRAMEDULLARY (IM) NAIL INTERTROCHANTRIC AND RADIOFREQUENCY ABLATION;  Surgeon: Hessie Knows, MD;  Location: [facility] ORS;  Service: Orthopedics;  Laterality: Right;   KYPHOPLASTY N/A 08/29/2019   Procedure: KYPHOPLASTY T6, L1,L4 ,  RADIOFREQUENCY ABLATION;  Surgeon: Hessie Knows, MD;  Location: [facility] ORS;  Service: Orthopedics;  Laterality: N/A;   KYPHOPLASTY Right 09/01/2019   Procedure: Right Sacral Radiofrequency Ablation and Cement Augmentation, Right sacrum and iliac crest;  Surgeon: Hessie Knows, MD;  Location: [facility] ORS;  Service: Orthopedics;  Laterality: Right;   PORTA CATH INSERTION N/A 08/28/2019   Procedure: PORTA CATH INSERTION;  Surgeon: Algernon Huxley, MD;  Location: [place] CV LAB;  Service: Cardiovascular;  Laterality: N/A;    Social History   Socioeconomic History   Marital status: Single    Spouse name: Not on file   Number of children: Not on file   Years of education: Not on file   Highest education level: Not on file  Occupational History   Not on file  Tobacco Use   Smoking status: Never   Smokeless tobacco: Never  Vaping Use   Vaping Use: Never used  Substance and Sexual Activity   Alcohol use: Not Currently   Drug use: Not Currently   Sexual activity: Not Currently    Birth control/protection: None  Other Topics Concern   Not on file  Social History Narrative   Lives at home with children   Social  Determinants of Health   Financial Resource Strain: High Risk (11/18/2021)   Overall Financial Resource Strain (CARDIA)    Difficulty of Paying Living Expenses: Very hard  Food Insecurity: Food Insecurity Present (11/18/2021)   Hunger Vital Sign    Worried About Running Out of Food in the Last Year: Often true    Ran Out of Food in the Last Year: Often true  Transportation Needs: Unmet Transportation Needs (11/18/2021)   PRAPARE - Transportation    Lack of Transportation (Medical): Yes    Lack of Transportation (Non-Medical): Yes  Physical Activity: Inactive (11/18/2021)   Exercise Vital Sign    Days of Exercise per Week: 0 days    Minutes of Exercise per Session: 0 min  Stress: Stress Concern Present (11/18/2021)   [place]    Feeling of Stress : Very much  Social Connections: Socially Isolated (11/18/2021)   Social Connection and Isolation Panel [NHANES]    Frequency of Communication with Friends and Family: Three times a week    Frequency of Social Gatherings with Friends and Family: Three times a week    Attends  Religious Services: Never    Active Member of Clubs or Organizations: No    Attends Archivist Meetings: Never    Marital Status: Never married  Intimate Partner Violence: Not At Risk (11/18/2021)   Humiliation, Afraid, Rape, and Kick questionnaire    Fear of Current or Ex-Partner: No    Emotionally Abused: No    Physically Abused: No    Sexually Abused: No    Family History  Problem Relation Age of Onset   Colon cancer Maternal Uncle      Current Outpatient Medications:    Calcium Carb-Cholecalciferol 600-10 MG-MCG TABS, Take 1 tablet by mouth 2 (two) times daily., Disp: 180 tablet, Rfl: 0   capecitabine (XELODA) 500 MG tablet, Take 3 tablets (1,500 mg total) by mouth 2 (two) times daily after a meal. Take for 14 days, then hold for 7 days. Repeat every 21 days., Disp: 84 tablet, Rfl: 1    gabapentin (NEURONTIN) 600 MG tablet, Take 1 tablet (600 mg total) by mouth 3 (three) times daily., Disp: 90 tablet, Rfl: 3   lidocaine-prilocaine (EMLA) cream, Apply 1 application topically as needed. Apply small amount to port site at least 1 hour prior to it being accessed, cover with plastic wrap, Disp: 30 g, Rfl: 3   LORazepam (ATIVAN) 0.5 MG tablet, Take 1 tablet (0.5 mg total) by mouth every 6 (six) hours as needed for anxiety. AS NEEDED FOR NAUSEA, Disp: 45 tablet, Rfl: 0   Oxycodone HCl 10 MG TABS, Take 1/2 to 1 tablet (5-10 mg total) by mouth every 4 (four) hours as needed., Disp: 45 tablet, Rfl: 0   potassium chloride SA (KLOR-CON M) 20 MEQ tablet, Take 1 tablet (20 mEq total) by mouth daily., Disp: 30 tablet, Rfl: 0   tucatinib (TUKYSA) 150 MG tablet, Take 2 tablets (300 mg total) by mouth 2 (two) times daily., Disp: 120 tablet, Rfl: 1   albuterol (VENTOLIN HFA) 108 (90 Base) MCG/ACT inhaler, Inhale 2 puffs into the lungs every 6 (six) hours as needed for wheezing or shortness of breath. (Patient not taking: Reported on 11/18/2021), Disp: 8.5 g, Rfl: 2   benzoyl peroxide-erythromycin (BENZAMYCIN) gel, Apply topically 2 (two) times daily. (Patient not taking: Reported on 09/09/2021), Disp: 46.6 g, Rfl: 0   butalbital-acetaminophen-caffeine (FIORICET) 50-325-40 MG tablet, Take 1-2 tablets by mouth every 8 (eight) hours as needed for headache. (Patient not taking: Reported on 09/09/2021), Disp: 30 tablet, Rfl: 0   diphenoxylate-atropine (LOMOTIL) 2.5-0.025 MG tablet, Take 1 tablet by mouth 4 (four) times daily as needed for diarrhea or loose stools. (Patient not taking: Reported on 11/18/2021), Disp: 40 tablet, Rfl: 0   DULoxetine (CYMBALTA) 60 MG capsule, Take 1 capsule (60 mg total) by mouth daily. (Patient not taking: Reported on 11/18/2021), Disp: 30 capsule, Rfl: 3   morphine (MS CONTIN) 15 MG 12 hr tablet, Take 1 tablet (15 mg total) by mouth every 8 (eight) hours., Disp: 90 tablet, Rfl: 0    naloxone (NARCAN) nasal spray 4 mg/0.1 mL, Administer one spray in nostril for opioid overdose. If unresponsive after 1 dose, may repeat a dose every 2-3 minutes in alternating nostrils until emergency services arrive. (Patient not taking: Reported on 10/07/2021), Disp: 2 each, Rfl: 3   OLANZapine (ZYPREXA) 10 MG tablet, Take 1 tablet (10 mg total) by mouth at bedtime as needed (nausea). (Patient not taking: Reported on 11/18/2021), Disp: 30 tablet, Rfl: 2   omeprazole (PRILOSEC) 20 MG capsule, Take 1 capsule (20 mg total)  by mouth daily. (Patient not taking: Reported on 11/18/2021), Disp: 30 capsule, Rfl: 3   ondansetron (ZOFRAN) 8 MG tablet, Take 1 tablet (8 mg total) by mouth every 8 (eight) hours as needed for nausea or vomiting. (Patient not taking: Reported on 11/18/2021), Disp: 45 tablet, Rfl: 2   polyethylene glycol powder (MIRALAX) 17 GM/SCOOP powder, Take 17 g by mouth daily as needed. (Patient not taking: Reported on 11/18/2021), Disp: 238 g, Rfl: 3 No current facility-administered medications for this visit.  Facility-Administered Medications Ordered in Other Visits:    0.9 %  sodium chloride infusion, , Intravenous, Continuous, Burns, Wandra Feinstein, NP, Stopped at 06/02/20 1453   0.9 %  sodium chloride infusion, , Intravenous, Continuous, Borders, Kirt Boys, NP, Last Rate: 500 mL/hr at 11/18/21 1228, New Bag at 11/18/21 1228   heparin lock flush 100 unit/mL, 500 Units, Intravenous, Once, Sindy Guadeloupe, MD   heparin lock flush 100 unit/mL, 500 Units, Intravenous, Once, Sindy Guadeloupe, MD   heparin lock flush 100 unit/mL, 500 Units, Intracatheter, Once PRN, Sindy Guadeloupe, MD   sodium chloride flush (NS) 0.9 % injection 10 mL, 10 mL, Intravenous, Once, Sindy Guadeloupe, MD   sodium chloride flush (NS) 0.9 % injection 10 mL, 10 mL, Intravenous, Once, Sindy Guadeloupe, MD  Physical exam:  Vitals:   11/18/21 0908  BP: 108/71  Pulse: 70  Resp: 16  Temp: 98.3 F (36.8 C)  SpO2: 100%  Weight: 143 lb 8  oz (65.1 kg)   Physical Exam Cardiovascular:     Rate and Rhythm: Normal rate and regular rhythm.     Heart sounds: Normal heart sounds.  Pulmonary:     Effort: Pulmonary effort is normal.     Breath sounds: Normal breath sounds.  Skin:    General: Skin is warm and dry.  Neurological:     Mental Status: She is alert and oriented to person, place, and time.         Latest Ref Rng & Units 11/18/2021    8:53 AM  CMP  Glucose 70 - 99 mg/dL 156   BUN 6 - 20 mg/dL 10   Creatinine 0.44 - 1.00 mg/dL 0.59   Sodium 135 - 145 mmol/L 135   Potassium 3.5 - 5.1 mmol/L 3.2   Chloride 98 - 111 mmol/L 105   CO2 22 - 32 mmol/L 21   Calcium 8.9 - 10.3 mg/dL 8.3   Total Protein 6.5 - 8.1 g/dL 7.1   Total Bilirubin 0.3 - 1.2 mg/dL 0.3   Alkaline Phos 38 - 126 U/L 77   AST 15 - 41 U/L 44   ALT 0 - 44 U/L 24       Latest Ref Rng & Units 11/18/2021    8:53 AM  CBC  WBC 4.0 - 10.5 K/uL 4.9   Hemoglobin 12.0 - 15.0 g/dL 12.3   Hematocrit 36.0 - 46.0 % 37.3   Platelets 150 - 400 K/uL 318     No images are attached to the encounter.  NM Bone Scan Whole Body  Result Date: 11/03/2021 CLINICAL DATA:  Metastatic breast cancer. EXAM: NUCLEAR MEDICINE WHOLE BODY BONE SCAN TECHNIQUE: Whole body anterior and posterior images were obtained approximately 3 hours after intravenous injection of radiopharmaceutical. RADIOPHARMACEUTICALS:  20.67 mCi Technetium-64m MDP IV COMPARISON:  07/28/2021 chest, abdomen and pelvis CT dated 10/31/2021. FINDINGS: No significant change in an elongated thickened area of increased tracer uptake in the proximal right femur previously felt to  possibly due previous healed trauma and postsurgical change. Decreased tracer uptake in the anterior left 3rd rib at the location of a previously demonstrated healing fracture. Stable increased tracer uptake in the left innominate bone. Stable increased tracer uptake in the midthoracic spine, L1 and L4 region compatible with previous  kyphoplasties seen on the CT dated 10/31/2021. Stable increased tracer activity in the L2 vertebral body compatible with known metastatic disease. Stable increased tracer activity in the posterior right 2nd rib at the location of known metastatic disease. Normal renal and bladder activity. No new areas of abnormal bone uptake. IMPRESSION: Stable multi site bony metastatic disease, as described above. No new areas of metastatic seen. Electronically Signed   By: Claudie Revering M.D.   On: 11/03/2021 08:46   CT CHEST ABDOMEN PELVIS W CONTRAST  Result Date: 11/02/2021 CLINICAL DATA:  History of metastatic breast cancer, assess treatment response. * Tracking Code: BO * EXAM: CT CHEST, ABDOMEN, AND PELVIS WITH CONTRAST TECHNIQUE: Multidetector CT imaging of the chest, abdomen and pelvis was performed following the standard protocol during bolus administration of intravenous contrast. RADIATION DOSE REDUCTION: This exam was performed according to the departmental dose-optimization program which includes automated exposure control, adjustment of the mA and/or kV according to patient size and/or use of iterative reconstruction technique. CONTRAST:  162mL OMNIPAQUE IOHEXOL 300 MG/ML  SOLN COMPARISON:  Multiple priors including CT chest abdomen pelvis and nuclear medicine bone scan dated Jul 28, 2021 as well as nuclear medicine bone scan dated November 01, 2021 FINDINGS: CT CHEST FINDINGS Cardiovascular: Right chest Port-A-Cath with tip in the right atrium. Aortic atherosclerosis. No central pulmonary embolus on this nondedicated study. Normal size heart. No significant pericardial effusion/thickening. Mediastinum/Nodes: No suspicious thyroid nodule. No pathologically enlarged mediastinal, hilar or axillary lymph nodes. The esophagus is grossly unremarkable. Lungs/Pleura: No suspicious pulmonary nodules or masses. No focal airspace consolidation. No pleural effusion. No pneumothorax. Musculoskeletal: No suspicious chest wall  mass. CT ABDOMEN PELVIS FINDINGS Hepatobiliary: No suspicious hepatic lesion. Gallbladder is unremarkable. No biliary ductal dilation. Pancreas: No pancreatic ductal dilation or evidence of acute inflammation. Spleen: No splenomegaly or focal splenic lesion. Adrenals/Urinary Tract: Bilateral adrenal glands appear normal. No hydronephrosis. Kidneys demonstrate symmetric enhancement. Urinary bladder is minimally distended limiting evaluation. Stomach/Bowel: No radiopaque enteric contrast material was administered. Stomach is minimally distended limiting evaluation. No pathologic dilation of small or large bowel. No evidence of acute bowel inflammation. Vascular/Lymphatic: Normal caliber abdominal aorta. No pathologically enlarged abdominal or pelvic lymph nodes. Reproductive: Possible heterogeneous thickening of the endometrium, poorly evaluated on CT. No suspicious adnexal mass. Other: No significant abdominopelvic free fluid. Musculoskeletal: No significant interval change in the chronic osseous metastases for instance in the manubrium on image 17/2 and in the T4, T8, T10 and L2 vertebral bodies on image 94/5. No new suspicious osseous lesions identified. Wedge deformities with prior cement augmentation of the T6, L1 and L4 vertebral bodies, similar retropulsion of osseous and augmentation material into the spinal canal at L4 with associated canal and right neural foraminal narrowing. Prior cement augmentation of the right sacral ala and ilium. Partially visualized intramedullary and compression screw fixation hardware in the right femur. IMPRESSION: 1. Stable diffuse sclerotic osseous metastatic disease involving the axial and visible appendicular skeleton. No evidence of new or progressive osseous metastatic disease. 2. No evidence of lymphadenopathy or soft tissue metastatic disease in the chest, abdomen or pelvis. Electronically Signed   By: Dahlia Bailiff M.D.   On: 11/02/2021 11:47   ECHOCARDIOGRAM  COMPLETE  Result Date: 10/28/2021    ECHOCARDIOGRAM REPORT   Patient Name:   JERIANNE ANSELMO [id number] Date of Exam: 10/28/2021 Medical Rec #:  [id number]             Height:       62.0 in Accession #:    [id number]            Weight:       145.6 lb Date of Birth:  Jan 25, 1981             BSA:          1.670 m Patient Age:    65 years              BP:           114/70 mmHg Patient Gender: F                     HR:           76 bpm. Exam Location:  [facility] Procedure: 2D Echo, Color Doppler, Cardiac Doppler and Strain Analysis Indications:     Z09 Chemo; C50.919 Primary malignant neoplasm of breast with                  metastasis; C50.911 Malignant neoplasm of right breast                  metastatic to brain  History:         Patient has prior history of Echocardiogram examinations, most                  recent 08/01/2021. Breast cancer.  Sonographer:     Charmayne Sheer Referring Phys:  [id number] [place] Anna Landry [place] Diagnosing Phys: Kate Sable MD  Sonographer Comments: No subcostal window. Global longitudinal strain was attempted. IMPRESSIONS  1. Left ventricular ejection fraction, by estimation, is 60 to 65%. The left ventricle has normal function. The left ventricle has no regional wall motion abnormalities. Left ventricular diastolic parameters were normal. The average left ventricular global longitudinal strain is -19.2 %. The global longitudinal strain is normal.  2. Right ventricular systolic function is normal. The right ventricular size is normal.  3. The mitral valve is normal in structure. No evidence of mitral valve regurgitation.  4. The aortic valve is tricuspid. Aortic valve regurgitation is not visualized.  5. The inferior vena cava is normal in size with greater than 50% respiratory variability, suggesting right atrial pressure of 3 mmHg. FINDINGS  Left Ventricle: Left ventricular ejection fraction, by estimation, is 60 to 65%. The left ventricle has normal function. The left ventricle has no regional wall motion  abnormalities. The average left ventricular global longitudinal strain is -19.2 %. The global longitudinal strain is normal. The left ventricular internal cavity size was normal in size. There is no left ventricular hypertrophy. Left ventricular diastolic parameters were normal. Right Ventricle: The right ventricular size is normal. No increase in right ventricular wall thickness. Right ventricular systolic function is normal. Left Atrium: Left atrial size was normal in size. Right Atrium: Right atrial size was normal in size. Pericardium: There is no evidence of pericardial effusion. Mitral Valve: The mitral valve is normal in structure. No evidence of mitral valve regurgitation. Tricuspid Valve: The tricuspid valve is normal in structure. Tricuspid valve regurgitation is mild. Aortic Valve: The aortic valve is tricuspid. Aortic valve regurgitation is not visualized. Aortic valve mean gradient measures 3.0 mmHg. Aortic valve peak gradient measures 5.3  mmHg. Aortic valve area, by VTI measures 2.67 cm. Pulmonic Valve: The pulmonic valve was not well visualized. Pulmonic valve regurgitation is not visualized. Aorta: The aortic root and ascending aorta are structurally normal, with no evidence of dilitation. Venous: The inferior vena cava is normal in size with greater than 50% respiratory variability, suggesting right atrial pressure of 3 mmHg. IAS/Shunts: No atrial level shunt detected by color flow Doppler.  LEFT VENTRICLE PLAX 2D LVIDd:         3.82 cm   Diastology LVIDs:         2.68 cm   LV e' medial:    7.72 cm/s LV PW:         1.04 cm   LV E/e' medial:  6.8 LV IVS:        0.67 cm   LV e' lateral:   9.79 cm/s LVOT diam:     1.80 cm   LV E/e' lateral: 5.3 LV SV:         42 LV SV Index:   25        2D Longitudinal Strain LVOT Area:     2.54 cm  2D Strain GLS Avg:     -19.2 %  RIGHT VENTRICLE RV Basal diam:  2.38 cm LEFT ATRIUM           Index        RIGHT ATRIUM          Index LA diam:      2.70 cm 1.62 cm/m    RA Area:     9.19 cm LA Vol (A2C): 22.1 ml 13.23 ml/m  RA Volume:   21.00 ml 12.57 ml/m  AORTIC VALVE                    PULMONIC VALVE AV Area (Vmax):    2.26 cm     PV Vmax:       0.83 m/s AV Area (Vmean):   2.42 cm     PV Peak grad:  2.8 mmHg AV Area (VTI):     2.67 cm AV Vmax:           115.00 cm/s AV Vmean:          71.800 cm/s AV VTI:            0.157 m AV Peak Grad:      5.3 mmHg AV Mean Grad:      3.0 mmHg LVOT Vmax:         102.00 cm/s LVOT Vmean:        68.400 cm/s LVOT VTI:          0.165 m LVOT/AV VTI ratio: 1.05  AORTA Ao Root diam: 2.70 cm MITRAL VALVE               TRICUSPID VALVE MV Area (PHT): 2.95 cm    TR Peak grad:   24.6 mmHg MV Decel Time: 257 msec    TR Vmax:        248.00 cm/s MV E velocity: 52.30 cm/s MV A velocity: 42.00 cm/s  SHUNTS MV E/A ratio:  1.25        Systemic VTI:  0.16 m                            Systemic Diam: 1.80 cm Debbe Odea MD Electronically signed by Debbe Odea MD Signature Date/Time: 10/28/2021/1:00:11 PM    Final  DG Orthopantogram  Result Date: 10/27/2021 CLINICAL DATA:  The patient taking Xgeva. Right-sided jaw pain. Evaluate for osteonecrosis. EXAM: ORTHOPANTOGRAM/PANORAMIC COMPARISON:  None Available. FINDINGS: The bone mineralization appears grossly normal. There is no gross single-view evidence of osteonecrosis of the mandible. There are roots remaining in the bone for the right maxillary wisdom tooth but no crown, unknown if this is due to severe tooth decay having destroyed the crown of the tooth or an incomplete extraction may have previously been attempted. The tooth may also be impacted. Follow-up with a dentist or oral surgeon is recommended. The remaining teeth appear grossly intact as far as visualized. There is metallic amalgam in the left mandibular 6 and 12 year molars. The TMJs are unremarkable. IMPRESSION: 1. No single-view radiographic evidence of osteonecrosis of the mandible. 2. The right maxillary wisdom tooth has roots  remaining in the bone, but the crown of the tooth is either completely decayed off, or a prior extraction may have been attempted. Follow-up with a dentist or oral surgeon is recommended. 3. Obtain MRI or CT if there is clinical concern for occult osteonecrosis. Electronically Signed   By: Telford Nab M.D.   On: 10/27/2021 22:31     Assessment and plan- Patient is a 41 y.o. 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  She is here for on treatment assessment prior to cycle 2 of tucatinib Xeloda trastuzumab  Patient has previously received multiple cycles of Herceptin along with perjeta.  I have reviewed her CT chest abdomen and pelvis images independently and discussed findings with the patient which does not show any evidence of visceral metastases.  Bone metastases are stable.  She was switched to second line tucatinib Xeloda trastuzumab due to worsening brain metastases.  She is scheduled to get a repeat MRI brain next month.  Patient will proceed with trastuzumab today and will begin cycle 2 of Xeloda today along with tucatinib.  I will see her back in 3 weeks prior to start of cycle 3.  She had a repeat echocardiogram as well which was normal  Anxiety: I have asked her to take as needed lorazepam at this time and if symptoms are not well controlled I will add Seroquel as well  Neoplasm related pain:Continue as needed oxycodone and morphine long-acting   Visit Diagnosis 1. Multiple pathological fractures, initial encounter   2. Primary malignant neoplasm of breast with metastasis ([place])   3. Encounter for antineoplastic chemotherapy   4. Encounter for monoclonal antibody treatment for malignancy   5. Neoplasm related pain   6. Anxiety      Dr. Randa Evens, MD, MPH [facility] [phone] 11/18/2021 1:00 PM

## 2021-11-18 NOTE — Addendum Note (Signed)
Addended by: Randa Evens C on: 11/18/2021 01:54 PM   Modules accepted: Orders

## 2021-11-22 ENCOUNTER — Other Ambulatory Visit: Payer: Self-pay

## 2021-11-22 ENCOUNTER — Encounter: Payer: Self-pay | Admitting: Oncology

## 2021-11-22 ENCOUNTER — Telehealth: Payer: Self-pay | Admitting: *Deleted

## 2021-11-22 MED ORDER — GABAPENTIN 600 MG PO TABS
600.0000 mg | ORAL_TABLET | Freq: Three times a day (TID) | ORAL | 3 refills | Status: DC
  Filled 2021-11-22: qty 90, 30d supply, fill #0

## 2021-11-22 NOTE — Telephone Encounter (Signed)
Dr. Janese Banks saw the request and refilled it

## 2021-11-30 ENCOUNTER — Other Ambulatory Visit (HOSPITAL_COMMUNITY): Payer: Self-pay

## 2021-11-30 ENCOUNTER — Encounter: Payer: Self-pay | Admitting: Oncology

## 2021-12-06 ENCOUNTER — Encounter: Payer: Self-pay | Admitting: Oncology

## 2021-12-06 ENCOUNTER — Other Ambulatory Visit (HOSPITAL_COMMUNITY): Payer: Self-pay

## 2021-12-09 ENCOUNTER — Encounter: Payer: Self-pay | Admitting: Oncology

## 2021-12-09 ENCOUNTER — Other Ambulatory Visit: Payer: Self-pay

## 2021-12-09 ENCOUNTER — Inpatient Hospital Stay (HOSPITAL_BASED_OUTPATIENT_CLINIC_OR_DEPARTMENT_OTHER): Payer: Self-pay | Admitting: Oncology

## 2021-12-09 ENCOUNTER — Inpatient Hospital Stay: Payer: Self-pay

## 2021-12-09 ENCOUNTER — Other Ambulatory Visit: Payer: Self-pay | Admitting: *Deleted

## 2021-12-09 ENCOUNTER — Ambulatory Visit: Payer: Self-pay

## 2021-12-09 VITALS — BP 119/86 | HR 67 | Temp 97.8°F | Resp 16 | Wt 141.3 lb

## 2021-12-09 DIAGNOSIS — G893 Neoplasm related pain (acute) (chronic): Secondary | ICD-10-CM

## 2021-12-09 DIAGNOSIS — C50919 Malignant neoplasm of unspecified site of unspecified female breast: Secondary | ICD-10-CM

## 2021-12-09 DIAGNOSIS — Z79899 Other long term (current) drug therapy: Secondary | ICD-10-CM

## 2021-12-09 DIAGNOSIS — Z5112 Encounter for antineoplastic immunotherapy: Secondary | ICD-10-CM

## 2021-12-09 DIAGNOSIS — M8440XA Pathological fracture, unspecified site, initial encounter for fracture: Secondary | ICD-10-CM

## 2021-12-09 LAB — URINALYSIS, COMPLETE (UACMP) WITH MICROSCOPIC
Bacteria, UA: NONE SEEN
Bilirubin Urine: NEGATIVE
Glucose, UA: NEGATIVE mg/dL
Hgb urine dipstick: NEGATIVE
Ketones, ur: NEGATIVE mg/dL
Nitrite: NEGATIVE
Protein, ur: NEGATIVE mg/dL
Specific Gravity, Urine: 1.01 (ref 1.005–1.030)
pH: 7 (ref 5.0–8.0)

## 2021-12-09 MED ORDER — POTASSIUM CHLORIDE CRYS ER 20 MEQ PO TBCR
20.0000 meq | EXTENDED_RELEASE_TABLET | Freq: Every day | ORAL | 0 refills | Status: DC
  Filled 2021-12-09: qty 30, 30d supply, fill #0

## 2021-12-09 MED ORDER — ACETAMINOPHEN 325 MG PO TABS
650.0000 mg | ORAL_TABLET | Freq: Once | ORAL | Status: AC
  Administered 2021-12-09: 650 mg via ORAL
  Filled 2021-12-09: qty 2

## 2021-12-09 MED ORDER — DIPHENHYDRAMINE HCL 50 MG/ML IJ SOLN
50.0000 mg | Freq: Once | INTRAMUSCULAR | Status: AC
  Administered 2021-12-09: 50 mg via INTRAVENOUS
  Filled 2021-12-09: qty 1

## 2021-12-09 MED ORDER — TRASTUZUMAB-ANNS CHEMO 150 MG IV SOLR
6.0000 mg/kg | Freq: Once | INTRAVENOUS | Status: AC
  Administered 2021-12-09: 399 mg via INTRAVENOUS
  Filled 2021-12-09: qty 19

## 2021-12-09 MED ORDER — HEPARIN SOD (PORK) LOCK FLUSH 100 UNIT/ML IV SOLN
500.0000 [IU] | Freq: Once | INTRAVENOUS | Status: AC | PRN
  Administered 2021-12-09: 500 [IU]
  Filled 2021-12-09: qty 5

## 2021-12-09 MED ORDER — ONDANSETRON HCL 8 MG PO TABS
8.0000 mg | ORAL_TABLET | Freq: Three times a day (TID) | ORAL | 2 refills | Status: DC | PRN
  Filled 2021-12-09: qty 45, 15d supply, fill #0

## 2021-12-09 MED ORDER — HEPARIN SOD (PORK) LOCK FLUSH 100 UNIT/ML IV SOLN
INTRAVENOUS | Status: AC
  Filled 2021-12-09: qty 5

## 2021-12-09 MED ORDER — OXYCODONE HCL 10 MG PO TABS
5.0000 mg | ORAL_TABLET | ORAL | 0 refills | Status: DC | PRN
  Filled 2021-12-09: qty 45, 8d supply, fill #0

## 2021-12-09 MED ORDER — SODIUM CHLORIDE 0.9 % IV SOLN
Freq: Once | INTRAVENOUS | Status: AC
  Filled 2021-12-09: qty 250

## 2021-12-09 NOTE — Patient Instructions (Signed)
Instrucciones al darle de alta: Discharge Instructions Gracias por elegir al [facility] de Cncer de [place] para brindarle atencin mdica de oncologa y Music therapist.   Si usted tiene una cita de laboratorio con [place], por favor vaya directamente [place] y regstrese en el rea de Control and instrumentation engineer.   Use ropa cmoda y [place] para tener fcil acceso a las vas del Portacath (acceso venoso de Engineer, site duracin) o la lnea PICC (catter central colocado por va perifrica).   Nos esforzamos por ofrecerle tiempo de calidad con su proveedor. Es posible que tenga que volver a programar su cita si llega tarde (15 minutos o ms).  El llegar tarde le afecta a usted y a otros pacientes cuyas citas son posteriores a Merchandiser, retail.  Adems, si usted falta a tres o ms citas sin avisar a la oficina, puede ser retirado(a) de la clnica a discrecin del proveedor.      Para las solicitudes de renovacin de recetas, pida a su farmacia que se ponga en contacto con nuestra oficina y deje que transcurran 27 horas para que se complete el proceso de las renovaciones.    Hoy usted recibi los siguientes agentes de quimioterapia e/o inmunoterapia , [place]      Para ayudar a prevenir las nuseas y los vmitos despus de su tratamiento, le recomendamos que tome su medicamento para las nuseas segn las indicaciones.  LOS SNTOMAS QUE DEBEN COMUNICARSE INMEDIATAMENTE SE INDICAN A CONTINUACIN: *FIEBRE SUPERIOR A 100.4 F (38 C) O MS *ESCALOFROS O SUDORACIN *NUSEAS Y VMITOS QUE NO SE CONTROLAN CON EL MEDICAMENTO PARA LAS NUSEAS *DIFICULTAD INUSUAL PARA RESPIRAR  *MORETONES O HEMORRAGIAS NO HABITUALES *PROBLEMAS URINARIOS (dolor o ardor al Garment/textile technologist o frecuencia para Garment/textile technologist) *PROBLEMAS INTESTINALES (diarrea inusual, estreimiento, dolor cerca del ano) SENSIBILIDAD EN LA BOCA Y EN LA GARGANTA CON O SIN LA PRESENCIA DE LCERAS (dolor de garganta, llagas en la boca o dolor de muelas/dientes) ERUPCIN,  HINCHAZN O DOLORES INUSUALES FLUJO VAGINAL INUSUAL O PICAZN/RASQUIA    Los puntos marcados con un asterisco ( *) indican una posible emergencia y debe hacer un seguimiento tan pronto como le sea posible o vaya al Departamento de Emergencias si se le presenta algn problema.  Por favor, muestre la Jessie DE ADVERTENCIA DE Windy Canny DE ADVERTENCIA DE Benay Spice al registrarse en [address] de Emergencias y a la enfermera de triaje.  Si tiene preguntas despus de su visita o necesita cancelar o volver a programar su cita, por favor pngase en contacto con [facility] AT -MEDICAL ONCOLOGY  [phone]  y [place] instrucciones. Las horas de oficina son de 8:00 a.m. a 4:30 p.m. de lunes a viernes. Por favor, tenga en cuenta que los mensajes de voz que se dejan despus de las 4:00 p.m. posiblemente no se devolvern hasta el siguiente da de [place].  Cerramos los fines de semana y [employer]. En todo momento tiene acceso a una enfermera para preguntas urgentes. Por favor, llame al nmero principal de la clnica  [phone] y [place] instrucciones.   Para cualquier pregunta que no sea de carcter urgente, tambin puede ponerse en contacto con su proveedor [employer]. Ahora ofrecemos visitas electrnicas para cualquier persona mayor de 18 aos que solicite atencin mdica en lnea para los sntomas que no sean urgentes. Para ms detalles vaya a [email or web address].   Tambin puede bajar la aplicacin de MyChart! Vaya a la tienda de aplicaciones, busque "MyChart", abra la aplicacin,  seleccione [place], e ingrese con su nombre de usuario y la contrasea de Pharmacist, community.  Las mscaras son opcionales en los centros de Hotel manager. Si desea que su equipo de cuidados mdicos use una [employer] atienden, por favor hgaselo saber al personal. Bethann Berkshire una persona de apoyo que tenga por lo menos 16 aos para que le acompae a sus  citas.

## 2021-12-09 NOTE — Progress Notes (Signed)
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  History obtained with the help of an interpreter.Patient self palpated left breast mass which was followed by a diagnostic bilateral mammogram.  Mammogram showed 3.1 x 2.9 x 1.9 cm hypoechoic mass at the 1 o'clock position of the left breast.  For abnormal cortically thickened left axillary lymph nodes measuring up to 5 mm.  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  Patient was also having ongoing back pain and was seen by [facility] orthopedics Dr. Izola Price who ordered MRI lumbar spine without contrast which showed possible pathologic fractures of L1 and L4 vertebral bodies with greater than 50% height loss at L1 and abnormal signal involving L2-L3 S1 as well as right iliac bone concerning for metastatic disease.  Patient is a single mother of 3 adult children and is very anxious today.  She reports significant back pain which radiates to her bilateral thighs.  Denies any focal tingling numbness or weakness.  Denies any bowel bladder incontinence.  Pain has been uncontrolled despite taking Tylenol.  No prior history of abnormal breast biopsies.  No family  history of breast cancer   PET and MRI showed 3 areas of pathologic fracture of her spine as well as widespread bony metastatic disease and concern for impending fracture of the right hip.  Given her worsening pain she was asked to come to the ER.  She has been evaluated by Dr. Rosita Kea from orthopedic surgery and underwent kyphoplasty at 3 different levels. T6 L1 and L4 along with radiofrequency ablation.    She also underwent prophylactic fixation of the right hip and not affected the sacral region.     Patient received first dose of Herceptin and Perjeta on 09/04/2019.  Patient initially received Taxol and Herceptin perjeta.  Presently patient is on maintenance Herceptin and Perjeta alone.  Patient found to have brain mets in November 2022 for which she received whole brain radiation treatment but there was no evidence of systemic progression.  She was continued on Herceptin and Perjeta.  Repeat MRI in June 2023 showed slight progression of brain mets and plan is to switch her to Herceptin tucatinib Xeloda  Interval history-right hip pain has improved significantly.  She still has some dull aching pain in her bilateral legs as well as pain from her neuropathy for which she uses occasional oxycodone.  Tolerating Xeloda and tucatinib well other than occasional episodes of nausea and vomiting.  Also reports occasional headaches  ECOG PS- 1 Pain scale- 3 Opioid associated constipation- no  Review of systems- Review of Systems  Constitutional:  Positive for malaise/fatigue. Negative for chills, fever and weight loss.  HENT:  Negative for congestion, ear discharge and nosebleeds.   Eyes:  Negative for  blurred vision.  Respiratory:  Negative for cough, hemoptysis, sputum production, shortness of breath and wheezing.   Cardiovascular:  Negative for chest pain, palpitations, orthopnea and claudication.  Gastrointestinal:  Positive for nausea. Negative for abdominal pain, blood in stool, constipation,  diarrhea, heartburn, melena and vomiting.  Genitourinary:  Negative for dysuria, flank pain, frequency, hematuria and urgency.  Musculoskeletal:  Negative for back pain, joint pain and myalgias.  Skin:  Negative for rash.  Neurological:  Positive for headaches. Negative for dizziness, tingling, focal weakness, seizures and weakness.  Endo/Heme/Allergies:  Does not bruise/bleed easily.  Psychiatric/Behavioral:  Negative for depression and suicidal ideas. The patient does not have insomnia.       No Known Allergies   Past Medical History:  Diagnosis Date   Anxiety    Breast cancer ([place])    with mets   Cancer ([place])    Colitis    COVID-19 in immunocompromised patient ([place])    Family history of colon cancer    Vertigo      Past Surgical History:  Procedure Laterality Date   BREAST BIOPSY Left 08/14/2019   [place] bx of mass, coil marker, path pending   BREAST BIOPSY Left 08/14/2019   [place] bx of LN, hydromarker, path pending   BREAST BIOPSY Left 08/14/2019   affirm bx of calcs, x marker, path pending   ESOPHAGOGASTRODUODENOSCOPY (EGD) WITH PROPOFOL N/A 10/05/2019   Procedure: ESOPHAGOGASTRODUODENOSCOPY (EGD) WITH PROPOFOL;  Surgeon: Lin Landsman, MD;  Location: [facility];  Service: Gastroenterology;  Laterality: N/A;   FLEXIBLE SIGMOIDOSCOPY N/A 10/05/2019   Procedure: FLEXIBLE SIGMOIDOSCOPY;  Surgeon: Lin Landsman, MD;  Location: [facility] ENDOSCOPY;  Service: Gastroenterology;  Laterality: N/A;   INTRAMEDULLARY (IM) NAIL INTERTROCHANTERIC Right 09/01/2019   Procedure: INTRAMEDULLARY (IM) NAIL INTERTROCHANTRIC AND RADIOFREQUENCY ABLATION;  Surgeon: Hessie Knows, MD;  Location: [facility] ORS;  Service: Orthopedics;  Laterality: Right;   KYPHOPLASTY N/A 08/29/2019   Procedure: KYPHOPLASTY T6, L1,L4 ,  RADIOFREQUENCY ABLATION;  Surgeon: Hessie Knows, MD;  Location: [facility] ORS;  Service: Orthopedics;  Laterality: N/A;   KYPHOPLASTY Right 09/01/2019   Procedure: Right Sacral  Radiofrequency Ablation and Cement Augmentation, Right sacrum and iliac crest;  Surgeon: Hessie Knows, MD;  Location: [facility] ORS;  Service: Orthopedics;  Laterality: Right;   PORTA CATH INSERTION N/A 08/28/2019   Procedure: PORTA CATH INSERTION;  Surgeon: Algernon Huxley, MD;  Location: [place] CV LAB;  Service: Cardiovascular;  Laterality: N/A;    Social History   Socioeconomic History   Marital status: Single    Spouse name: Not on file   Number of children: Not on file   Years of education: Not on file   Highest education level: Not on file  Occupational History   Not on file  Tobacco Use   Smoking status: Never   Smokeless tobacco: Never  Vaping Use   Vaping Use: Never used  Substance and Sexual Activity   Alcohol use: Not Currently   Drug use: Not Currently   Sexual activity: Not Currently    Birth control/protection: None  Other Topics Concern   Not on file  Social History Narrative   Lives at home with children   Social Determinants of Health   Financial Resource Strain: High Risk (11/18/2021)   Overall Financial Resource Strain (CARDIA)    Difficulty of Paying Living Expenses: Very hard  Food Insecurity: Food Insecurity Present (11/18/2021)   Hunger Vital Sign    Worried About Running Out of Food in the Last  Year: Often true    [place] in the Last Year: Often true  Transportation Needs: Unmet Transportation Needs (11/18/2021)   PRAPARE - Hydrologist (Medical): Yes    Lack of Transportation (Non-Medical): Yes  Physical Activity: Inactive (11/18/2021)   Exercise Vital Sign    Days of Exercise per Week: 0 days    Minutes of Exercise per Session: 0 min  Stress: Stress Concern Present (11/18/2021)   [place]    Feeling of Stress : Very much  Social Connections: Socially Isolated (11/18/2021)   Social Connection and Isolation Panel [NHANES]    Frequency of Communication  with Friends and Family: Three times a week    Frequency of Social Gatherings with Friends and Family: Three times a week    Attends Religious Services: Never    Active Member of Clubs or Organizations: No    Attends Archivist Meetings: Never    Marital Status: Never married  Intimate Partner Violence: Not At Risk (11/18/2021)   Humiliation, Afraid, Rape, and Kick questionnaire    Fear of Current or Ex-Partner: No    Emotionally Abused: No    Physically Abused: No    Sexually Abused: No    Family History  Problem Relation Age of Onset   Colon cancer Maternal Uncle      Current Outpatient Medications:    Calcium Carb-Cholecalciferol 600-10 MG-MCG TABS, Take 1 tablet by mouth 2 (two) times daily., Disp: 180 tablet, Rfl: 0   capecitabine (XELODA) 500 MG tablet, Take 3 tablets (1,500 mg total) by mouth 2 (two) times daily after a meal. Take for 14 days, then hold for 7 days. Repeat every 21 days., Disp: 84 tablet, Rfl: 1   gabapentin (NEURONTIN) 600 MG tablet, Take 1 tablet (600 mg total) by mouth 3 (three) times daily., Disp: 90 tablet, Rfl: 3   lidocaine-prilocaine (EMLA) cream, Apply 1 application topically as needed. Apply small amount to port site at least 1 hour prior to it being accessed, cover with plastic wrap, Disp: 30 g, Rfl: 3   LORazepam (ATIVAN) 0.5 MG tablet, Take 1 tablet (0.5 mg total) by mouth every 6 (six) hours as needed for anxiety (and nausea). AS NEEDED FOR NAUSEA, Disp: 45 tablet, Rfl: 0   morphine (MS CONTIN) 15 MG 12 hr tablet, Take 1 tablet (15 mg total) by mouth every 8 (eight) hours., Disp: 90 tablet, Rfl: 0   polyethylene glycol powder (MIRALAX) 17 GM/SCOOP powder, Take 17 g by mouth daily as needed., Disp: 238 g, Rfl: 3   potassium chloride SA (KLOR-CON M) 20 MEQ tablet, Take 1 tablet (20 mEq total) by mouth daily., Disp: 30 tablet, Rfl: 0   tucatinib (TUKYSA) 150 MG tablet, Take 2 tablets (300 mg total) by mouth 2 (two) times daily., Disp: 120 tablet,  Rfl: 1   albuterol (VENTOLIN HFA) 108 (90 Base) MCG/ACT inhaler, Inhale 2 puffs into the lungs every 6 (six) hours as needed for wheezing or shortness of breath. (Patient not taking: Reported on 11/18/2021), Disp: 8.5 g, Rfl: 2   benzoyl peroxide-erythromycin (BENZAMYCIN) gel, Apply topically 2 (two) times daily. (Patient not taking: Reported on 09/09/2021), Disp: 46.6 g, Rfl: 0   butalbital-acetaminophen-caffeine (FIORICET) 50-325-40 MG tablet, Take 1-2 tablets by mouth every 8 (eight) hours as needed for headache. (Patient not taking: Reported on 09/09/2021), Disp: 30 tablet, Rfl: 0   diphenoxylate-atropine (LOMOTIL) 2.5-0.025 MG tablet, Take 1  tablet by mouth 4 (four) times daily as needed for diarrhea or loose stools. (Patient not taking: Reported on 11/18/2021), Disp: 40 tablet, Rfl: 0   DULoxetine (CYMBALTA) 60 MG capsule, Take 1 capsule (60 mg total) by mouth daily. (Patient not taking: Reported on 11/18/2021), Disp: 30 capsule, Rfl: 3   naloxone (NARCAN) nasal spray 4 mg/0.1 mL, Administer one spray in nostril for opioid overdose. If unresponsive after 1 dose, may repeat a dose every 2-3 minutes in alternating nostrils until emergency services arrive. (Patient not taking: Reported on 10/07/2021), Disp: 2 each, Rfl: 3   OLANZapine (ZYPREXA) 10 MG tablet, Take 1 tablet (10 mg total) by mouth at bedtime as needed (nausea). (Patient not taking: Reported on 11/18/2021), Disp: 30 tablet, Rfl: 2   omeprazole (PRILOSEC) 20 MG capsule, Take 1 capsule (20 mg total) by mouth daily. (Patient not taking: Reported on 11/18/2021), Disp: 30 capsule, Rfl: 3   ondansetron (ZOFRAN) 8 MG tablet, Take 1 tablet (8 mg total) by mouth every 8 (eight) hours as needed for nausea or vomiting., Disp: 45 tablet, Rfl: 2   Oxycodone HCl 10 MG TABS, Take 1/2 to 1 tablet (5-10 mg total) by mouth every 4 (four) hours as needed., Disp: 45 tablet, Rfl: 0 No current facility-administered medications for this visit.  Facility-Administered  Medications Ordered in Other Visits:    0.9 %  sodium chloride infusion, , Intravenous, Continuous, Burns, Renda Rolls, NP, Stopped at 06/02/20 1453   heparin lock flush 100 UNIT/ML injection, , , ,    heparin lock flush 100 unit/mL, 500 Units, Intravenous, Once, Creig Hines, MD   heparin lock flush 100 unit/mL, 500 Units, Intravenous, Once, Creig Hines, MD   sodium chloride flush (NS) 0.9 % injection 10 mL, 10 mL, Intravenous, Once, Creig Hines, MD   sodium chloride flush (NS) 0.9 % injection 10 mL, 10 mL, Intravenous, Once, Creig Hines, MD  Physical exam:  Vitals:   12/09/21 0927  BP: 119/86  Pulse: 67  Resp: 16  Temp: 97.8 F (36.6 C)  SpO2: 100%  Weight: 141 lb 4.8 oz (64.1 kg)   Physical Exam Constitutional:      General: She is not in acute distress. Cardiovascular:     Rate and Rhythm: Normal rate and regular rhythm.     Heart sounds: Normal heart sounds.  Pulmonary:     Effort: Pulmonary effort is normal.     Breath sounds: Normal breath sounds.  Abdominal:     General: Bowel sounds are normal.     Palpations: Abdomen is soft.  Skin:    General: Skin is warm and dry.  Neurological:     Mental Status: She is alert and oriented to person, place, and time.         Latest Ref Rng & Units 11/18/2021    8:53 AM  CMP  Glucose 70 - 99 mg/dL 540   BUN 6 - 20 mg/dL 10   Creatinine 4.19 - 1.00 mg/dL 6.79   Sodium 473 - 847 mmol/L 135   Potassium 3.5 - 5.1 mmol/L 3.2   Chloride 98 - 111 mmol/L 105   CO2 22 - 32 mmol/L 21   Calcium 8.9 - 10.3 mg/dL 8.3   Total Protein 6.5 - 8.1 g/dL 7.1   Total Bilirubin 0.3 - 1.2 mg/dL 0.3   Alkaline Phos 38 - 126 U/L 77   AST 15 - 41 U/L 44   ALT 0 - 44 U/L 24  Latest Ref Rng & Units 11/18/2021    8:53 AM  CBC  WBC 4.0 - 10.5 K/uL 4.9   Hemoglobin 12.0 - 15.0 g/dL 12.3   Hematocrit 36.0 - 46.0 % 37.3   Platelets 150 - 400 K/uL 318     No images are attached to the encounter.  No results  found.   Assessment and plan- Patient is a 41 y.o. 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  She is here for on treatment assessment prior to cycle 3 of tucatinib Xeloda trastuzumab  Counts okay to proceed with cycle 3 of trastuzumab today.  She will start cycle 3 of Xeloda 2 weeks on and 1 week off today along with tucatinib which she is continuing until progression or toxicity.  She is scheduled for MRI brain next month.  Neoplasm related pain: Continue long-acting morphine and as needed oxycodone  Bone metastases: Patient is [place] in June 2023.  She was supposed to receive her next dose today but her calcium levels are low at 8.3.  So we will hold Xgeva today   Visit Diagnosis 1. Neoplasm related pain   2. Primary malignant neoplasm of breast with metastasis ([place])   3. Encounter for monoclonal antibody treatment for malignancy   4. High risk medication use      Dr. Randa Evens, MD, MPH [facility] at [facility] [phone] 12/09/2021 12:19 PM

## 2021-12-13 ENCOUNTER — Other Ambulatory Visit (HOSPITAL_COMMUNITY): Payer: Self-pay

## 2021-12-13 ENCOUNTER — Other Ambulatory Visit: Payer: Self-pay

## 2021-12-13 ENCOUNTER — Ambulatory Visit
Admission: RE | Admit: 2021-12-13 | Discharge: 2021-12-13 | Disposition: A | Discharge status: Home / Self Care | Payer: Self-pay | Source: Ambulatory Visit | Attending: Oncology | Admitting: Oncology

## 2021-12-13 DIAGNOSIS — C7931 Secondary malignant neoplasm of brain: Secondary | ICD-10-CM | POA: Insufficient documentation

## 2021-12-13 DIAGNOSIS — C50911 Malignant neoplasm of unspecified site of right female breast: Secondary | ICD-10-CM | POA: Insufficient documentation

## 2021-12-13 DIAGNOSIS — C50919 Malignant neoplasm of unspecified site of unspecified female breast: Secondary | ICD-10-CM | POA: Insufficient documentation

## 2021-12-13 LAB — URINE CULTURE: Culture: 20000 — AB

## 2021-12-13 MED ORDER — GADOPICLENOL 0.5 MMOL/ML IV SOLN
6.0000 mL | Freq: Once | INTRAVENOUS | Status: AC | PRN
  Administered 2021-12-13: 6 mL via INTRAVENOUS

## 2021-12-14 ENCOUNTER — Encounter: Payer: Self-pay | Admitting: Oncology

## 2021-12-15 ENCOUNTER — Other Ambulatory Visit: Payer: Self-pay | Admitting: *Deleted

## 2021-12-15 ENCOUNTER — Other Ambulatory Visit: Payer: Self-pay

## 2021-12-15 ENCOUNTER — Encounter: Payer: Self-pay | Admitting: Oncology

## 2021-12-15 DIAGNOSIS — C50911 Malignant neoplasm of unspecified site of right female breast: Secondary | ICD-10-CM

## 2021-12-15 MED ORDER — DEXAMETHASONE 4 MG PO TABS
8.0000 mg | ORAL_TABLET | Freq: Two times a day (BID) | ORAL | 0 refills | Status: DC
  Filled 2021-12-15: qty 56, 14d supply, fill #0

## 2021-12-20 ENCOUNTER — Other Ambulatory Visit (HOSPITAL_COMMUNITY): Payer: Self-pay

## 2021-12-21 ENCOUNTER — Other Ambulatory Visit (HOSPITAL_COMMUNITY): Payer: Self-pay

## 2021-12-22 ENCOUNTER — Ambulatory Visit: Payer: Self-pay | Attending: Radiation Oncology | Admitting: Radiation Oncology

## 2021-12-26 ENCOUNTER — Ambulatory Visit
Admission: RE | Admit: 2021-12-26 | Discharge: 2021-12-26 | Disposition: A | Discharge status: Home / Self Care | Payer: Self-pay | Source: Ambulatory Visit | Attending: Radiation Oncology | Admitting: Radiation Oncology

## 2021-12-26 ENCOUNTER — Other Ambulatory Visit: Payer: Self-pay | Admitting: Oncology

## 2021-12-26 ENCOUNTER — Encounter: Payer: Self-pay | Admitting: Radiation Oncology

## 2021-12-26 VITALS — BP 115/76 | HR 72 | Temp 99.6°F | Resp 16 | Wt 141.3 lb

## 2021-12-26 DIAGNOSIS — M8440XA Pathological fracture, unspecified site, initial encounter for fracture: Secondary | ICD-10-CM

## 2021-12-26 DIAGNOSIS — Z171 Estrogen receptor negative status [ER-]: Secondary | ICD-10-CM | POA: Insufficient documentation

## 2021-12-26 DIAGNOSIS — C50919 Malignant neoplasm of unspecified site of unspecified female breast: Secondary | ICD-10-CM

## 2021-12-26 DIAGNOSIS — C7931 Secondary malignant neoplasm of brain: Secondary | ICD-10-CM | POA: Insufficient documentation

## 2021-12-26 DIAGNOSIS — C50912 Malignant neoplasm of unspecified site of left female breast: Secondary | ICD-10-CM | POA: Insufficient documentation

## 2021-12-26 DIAGNOSIS — Z510 Encounter for antineoplastic radiation therapy: Secondary | ICD-10-CM | POA: Insufficient documentation

## 2021-12-26 DIAGNOSIS — C7951 Secondary malignant neoplasm of bone: Secondary | ICD-10-CM | POA: Insufficient documentation

## 2021-12-26 NOTE — Progress Notes (Signed)
Radiation Oncology Follow up Note  Name: Sophia [name]   Date:   12/26/2021 MRN:  [id number] DOB: 01-12-1981    This 41 y.o. female presents to the clinic today for reevaluation of brain metastasis and patient now out close to a year having completed palliative radiation therapy to her whole brain for stage IV breast cancer.  REFERRING PROVIDER: Sindy Guadeloupe, MD  HPI: Patient is a 41 year old Spanish-speaking female accompanied by translator well-known to department received whole brain radiation therapy approximately a year ago for widespread brain metastasis and patient with known stage IV breast cancer..  Recent MRI scan shows majority of 14 brain lesions have increased in size by few millimeters and also has associated multifocal increase in vasogenic edema.  Patient is currently continuing [place].  Patient is had overall good local control of her distant disease except her brain which is progressing.  She still is fairly asymptomatic specifically denies headaches any change in visual fields or any focal neurologic deficits.  Most recent bone scan shows stable multisite bony metastatic disease  COMPLICATIONS OF TREATMENT: none  FOLLOW UP COMPLIANCE: keeps appointments   PHYSICAL EXAM:  BP 115/76 (BP Location: Right Arm, Patient Position: Sitting, Cuff Size: Normal)   Pulse 72   Temp 99.6 F (37.6 C) (Tympanic)   Resp 16   Wt 141 lb 4.8 oz (64.1 kg)   BMI 25.84 kg/m  Crude visual fields are normal range no focal neurologic deficits are appreciated proprioception is intact.  Well-developed well-nourished patient in NAD. HEENT reveals PERLA, EOMI, discs not visualized.  Oral cavity is clear. No oral mucosal lesions are identified. Neck is clear without evidence of cervical or supraclavicular adenopathy. Lungs are clear to A&P. Cardiac examination is essentially unremarkable with regular rate and rhythm without murmur rub or thrill. Abdomen is benign with  no organomegaly or masses noted. Motor sensory and DTR levels are equal and symmetric in the upper and lower extremities. Cranial nerves II through XII are grossly intact. Proprioception is intact. No peripheral adenopathy or edema is identified. No motor or sensory levels are noted. Crude visual fields are within normal range.  RADIOLOGY RESULTS: Bone scan MRI brain reviewed compatible with above-stated findings  PLAN: At this time based on the progression of her known brain metastasis I have recommended salvage course of 30 Gray in 10 fractions.  Risks and benefits of treatment including hair loss fatigue alteration blood count skin reaction all were reviewed in detail with the patient.  She comprehends her recommendations well.  I have personally set up and ordered CT simulation for later this week.  I would like to take this opportunity to thank you for allowing me to participate in the care of your patient.Noreene Filbert, MD

## 2021-12-26 NOTE — Progress Notes (Signed)
DISCONTINUE ON PATHWAY REGIMEN - Breast     Cycle 1: A cycle is 21 days:     Capecitabine      Tucatinib      Trastuzumab-xxxx    Cycles 2 and beyond: A cycle is every 21 days:     Capecitabine      Tucatinib      Trastuzumab-xxxx   **Always confirm dose/schedule in your pharmacy ordering system**  REASON: Disease Progression PRIOR TREATMENT: [id number]: Capecitabine 1,000 mg/m2 BID D1-14 + Trastuzumab 8/6 mg/kg IV D1 + Tucatinib 300 mg BID D1-21 q21 Days TREATMENT RESPONSE: Progressive Disease (PD)  START OFF PATHWAY REGIMEN - Breast   [id number]:Fam-trastuzumab deruxtecan-nxki 5.4 mg/kg IV D1 q21 Days:   A cycle is every 21 days:     Fam-trastuzumab deruxtecan-nxki   **Always confirm dose/schedule in your pharmacy ordering system**  Patient Characteristics: Distant Metastases or Locoregional Recurrent Disease - Unresected or Locally Advanced Unresectable Disease Progressing after Neoadjuvant and Local Therapies, HER2 Positive, ER Negative, Chemotherapy, Third Line Therapeutic Status: Distant Metastases HER2 Status: Positive (+) ER Status: Negative (-) PR Status: Negative (-) Line of Therapy: Third Line Intent of Therapy: Non-Curative / Palliative Intent, Discussed with Patient

## 2021-12-26 NOTE — Progress Notes (Signed)
Patient receiving food stamps

## 2021-12-27 ENCOUNTER — Other Ambulatory Visit: Payer: Self-pay

## 2021-12-28 ENCOUNTER — Other Ambulatory Visit: Payer: Self-pay | Admitting: *Deleted

## 2021-12-28 ENCOUNTER — Encounter: Payer: Self-pay | Admitting: Oncology

## 2021-12-28 ENCOUNTER — Ambulatory Visit: Payer: Self-pay

## 2021-12-28 ENCOUNTER — Other Ambulatory Visit: Payer: Self-pay

## 2021-12-28 MED ORDER — GABAPENTIN 600 MG PO TABS
600.0000 mg | ORAL_TABLET | Freq: Three times a day (TID) | ORAL | 3 refills | Status: DC
  Filled 2021-12-28: qty 90, 30d supply, fill #0
  Filled 2022-02-08: qty 90, 30d supply, fill #1
  Filled 2022-03-21: qty 90, 30d supply, fill #2

## 2021-12-30 ENCOUNTER — Ambulatory Visit: Payer: Self-pay

## 2021-12-30 ENCOUNTER — Inpatient Hospital Stay: Payer: Self-pay | Attending: Oncology | Admitting: Oncology

## 2021-12-30 ENCOUNTER — Encounter: Payer: Self-pay | Admitting: Oncology

## 2021-12-30 ENCOUNTER — Ambulatory Visit
Admission: RE | Admit: 2021-12-30 | Discharge: 2021-12-30 | Disposition: A | Discharge status: Home / Self Care | Payer: Self-pay | Source: Ambulatory Visit | Attending: Radiation Oncology | Admitting: Radiation Oncology

## 2021-12-30 ENCOUNTER — Ambulatory Visit: Payer: Self-pay | Admitting: Oncology

## 2021-12-30 ENCOUNTER — Other Ambulatory Visit: Payer: Self-pay

## 2021-12-30 ENCOUNTER — Other Ambulatory Visit: Payer: Self-pay | Admitting: *Deleted

## 2021-12-30 VITALS — BP 121/86 | HR 66 | Temp 98.2°F | Resp 18 | Wt 139.3 lb

## 2021-12-30 DIAGNOSIS — C50919 Malignant neoplasm of unspecified site of unspecified female breast: Secondary | ICD-10-CM

## 2021-12-30 DIAGNOSIS — M8458XA Pathological fracture in neoplastic disease, other specified site, initial encounter for fracture: Secondary | ICD-10-CM | POA: Insufficient documentation

## 2021-12-30 DIAGNOSIS — C7931 Secondary malignant neoplasm of brain: Secondary | ICD-10-CM | POA: Insufficient documentation

## 2021-12-30 DIAGNOSIS — C7951 Secondary malignant neoplasm of bone: Secondary | ICD-10-CM | POA: Insufficient documentation

## 2021-12-30 DIAGNOSIS — M8440XA Pathological fracture, unspecified site, initial encounter for fracture: Secondary | ICD-10-CM

## 2021-12-30 DIAGNOSIS — C50412 Malignant neoplasm of upper-outer quadrant of left female breast: Secondary | ICD-10-CM | POA: Insufficient documentation

## 2021-12-30 DIAGNOSIS — Z171 Estrogen receptor negative status [ER-]: Secondary | ICD-10-CM | POA: Insufficient documentation

## 2021-12-30 DIAGNOSIS — Z800 Family history of malignant neoplasm of digestive organs: Secondary | ICD-10-CM | POA: Insufficient documentation

## 2021-12-30 DIAGNOSIS — Z5112 Encounter for antineoplastic immunotherapy: Secondary | ICD-10-CM | POA: Insufficient documentation

## 2021-12-30 MED ORDER — BUTALBITAL-APAP-CAFFEINE 50-325-40 MG PO TABS
1.0000 | ORAL_TABLET | Freq: Three times a day (TID) | ORAL | 0 refills | Status: DC | PRN
  Filled 2021-12-30: qty 30, 5d supply, fill #0

## 2021-12-30 NOTE — Progress Notes (Unsigned)
Pt states her headaches seems to be getting worse; causing more pain. FIORICET seems to help a lot.

## 2022-01-01 ENCOUNTER — Encounter: Payer: Self-pay | Admitting: Oncology

## 2022-01-01 NOTE — Progress Notes (Signed)
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  History obtained with the help of an interpreter.Patient self palpated left breast mass which was followed by a diagnostic bilateral mammogram.  Mammogram showed 3.1 x 2.9 x 1.9 cm hypoechoic mass at the 1 o'clock position of the left breast.  For abnormal cortically thickened left axillary lymph nodes measuring up to 5 mm.  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  Patient was also having ongoing back pain and was seen by [facility] Dr. Doyle Askew who ordered MRI lumbar spine without contrast which showed possible pathologic fractures of L1 and L4 vertebral bodies with greater than 50% height loss at L1 and abnormal signal involving L2-L3 S1 as well as right iliac bone concerning for metastatic disease.  Patient is a single mother of 3 adult children and is very anxious today.  She reports significant back pain which radiates to her bilateral thighs.  Denies any focal tingling numbness or weakness.  Denies any bowel bladder incontinence.  Pain has been uncontrolled despite taking Tylenol.  No prior history of abnormal breast biopsies.  No family history of breast cancer   PET  and MRI showed 3 areas of pathologic fracture of her spine as well as widespread bony metastatic disease and concern for impending fracture of the right hip.  Given her worsening pain she was asked to come to the ER.  She has been evaluated by Dr. Rudene Christians from orthopedic surgery and underwent kyphoplasty at 3 different levels. T6 L1 and L4 along with radiofrequency ablation.    She also underwent prophylactic fixation of the right hip and not affected the sacral region.     Patient received first dose of Herceptin and Perjeta on 09/04/2019.  Patient initially received Taxol and Herceptin perjeta.  Presently patient is on maintenance Herceptin and Perjeta alone.  Patient found to have brain mets in November 2022 for which she received whole brain radiation treatment but there was no evidence of systemic progression.  She was continued on Herceptin and Perjeta.  Repeat MRI in June 2023 showed slight progression of brain mets and plan is to switch her to Herceptin tucatinib Xeloda    Interval history-history obtained with the help of Hispanic interpreter.  She reports occasional headaches but denies other complaints.  ECOG PS- 1 Pain scale- 0 Opioid associated constipation- no  Review of systems- Review of Systems  Constitutional:  Negative for chills, fever, malaise/fatigue and weight loss.  HENT:  Negative for congestion, ear discharge and nosebleeds.   Eyes:  Negative for blurred vision.  Respiratory:  Negative for cough, hemoptysis, sputum production, shortness of breath and wheezing.   Cardiovascular:  Negative for chest pain, palpitations, orthopnea and claudication.  Gastrointestinal:  Negative for abdominal pain, blood in  stool, constipation, diarrhea, heartburn, melena, nausea and vomiting.  Genitourinary:  Negative for dysuria, flank pain, frequency, hematuria and urgency.  Musculoskeletal:  Negative for back pain, joint pain and myalgias.  Skin:  Negative for rash.  Neurological:  Positive  for headaches. Negative for dizziness, tingling, focal weakness, seizures and weakness.  Endo/Heme/Allergies:  Does not bruise/bleed easily.  Psychiatric/Behavioral:  Negative for depression and suicidal ideas. The patient does not have insomnia.       No Known Allergies   Past Medical History:  Diagnosis Date   Anxiety    Breast cancer ([place])    with mets   Cancer ([place])    Colitis    COVID-19 in immunocompromised patient ([place])    Family history of colon cancer    Vertigo      Past Surgical History:  Procedure Laterality Date   BREAST BIOPSY Left 08/14/2019   [place] bx of mass, coil marker, path pending   BREAST BIOPSY Left 08/14/2019   [place] bx of LN, hydromarker, path pending   BREAST BIOPSY Left 08/14/2019   affirm bx of calcs, x marker, path pending   ESOPHAGOGASTRODUODENOSCOPY (EGD) WITH PROPOFOL N/A 10/05/2019   Procedure: ESOPHAGOGASTRODUODENOSCOPY (EGD) WITH PROPOFOL;  Surgeon: Lin Landsman, MD;  Location: [place];  Service: Gastroenterology;  Laterality: N/A;   FLEXIBLE SIGMOIDOSCOPY N/A 10/05/2019   Procedure: FLEXIBLE SIGMOIDOSCOPY;  Surgeon: Lin Landsman, MD;  Location: [facility] ENDOSCOPY;  Service: Gastroenterology;  Laterality: N/A;   INTRAMEDULLARY (IM) NAIL INTERTROCHANTERIC Right 09/01/2019   Procedure: INTRAMEDULLARY (IM) NAIL INTERTROCHANTRIC AND RADIOFREQUENCY ABLATION;  Surgeon: Hessie Knows, MD;  Location: [facility] ORS;  Service: Orthopedics;  Laterality: Right;   KYPHOPLASTY N/A 08/29/2019   Procedure: KYPHOPLASTY T6, L1,L4 ,  RADIOFREQUENCY ABLATION;  Surgeon: Hessie Knows, MD;  Location: [facility] ORS;  Service: Orthopedics;  Laterality: N/A;   KYPHOPLASTY Right 09/01/2019   Procedure: Right Sacral Radiofrequency Ablation and Cement Augmentation, Right sacrum and iliac crest;  Surgeon: Hessie Knows, MD;  Location: [facility];  Service: Orthopedics;  Laterality: Right;   PORTA CATH INSERTION N/A 08/28/2019   Procedure: PORTA CATH INSERTION;  Surgeon: Algernon Huxley, MD;  Location: [place] [facility];  Service: Cardiovascular;  Laterality: N/A;    Social History   Socioeconomic History   Marital status: Single    Spouse name: Not on file   Number of children: Not on file   Years of education: Not on file   Highest education level: Not on file  Occupational History   Not on file  Tobacco Use   Smoking status: Never   Smokeless tobacco: Never  Vaping Use   Vaping Use: Never used  Substance and Sexual Activity   Alcohol use: Not Currently   Drug use: Not Currently   Sexual activity: Not Currently    Birth control/protection: None  Other Topics Concern   Not on file  Social History Narrative   Lives at home with children   Social Determinants of Health   Financial Resource Strain: High Risk (11/18/2021)   Overall Financial Resource Strain (CARDIA)    Difficulty of Paying Living Expenses: Very hard  Food Insecurity: Food Insecurity Present (12/26/2021)   Hunger Vital Sign    Worried About Running Out of Food in the Last Year: Often true    Ran Out of Food in the Last Year: Often true  Transportation Needs: Unmet Transportation Needs (12/26/2021)   PRAPARE - Hydrologist (Medical): Yes  Lack of Transportation (Non-Medical): Yes  Physical Activity: Inactive (11/18/2021)   Exercise Vital Sign    Days of Exercise per Week: 0 days    Minutes of Exercise per Session: 0 min  Stress: Stress Concern Present (11/18/2021)   [place]    Feeling of Stress : Very much  Social Connections: Socially Isolated (11/18/2021)   Social Connection and Isolation Panel [NHANES]    Frequency of Communication with Friends and Family: Three times a week    Frequency of Social Gatherings with Friends and Family: Three times a week    Attends Religious Services: Never    Active Member of Clubs or Organizations: No    Attends Archivist Meetings: Never     Marital Status: Never married  Intimate Partner Violence: Not At Risk (11/18/2021)   Humiliation, Afraid, Rape, and Kick questionnaire    Fear of Current or Ex-Partner: No    Emotionally Abused: No    Physically Abused: No    Sexually Abused: No    Family History  Problem Relation Age of Onset   Colon cancer Maternal Uncle      Current Outpatient Medications:    Calcium Carb-Cholecalciferol 600-10 MG-MCG TABS, Take 1 tablet by mouth 2 (two) times daily., Disp: 180 tablet, Rfl: 0   capecitabine (XELODA) 500 MG tablet, Take 3 tablets (1,500 mg total) by mouth 2 (two) times daily after a meal. Take for 14 days, then hold for 7 days. Repeat every 21 days., Disp: 84 tablet, Rfl: 1   gabapentin (NEURONTIN) 600 MG tablet, Take 1 tablet (600 mg total) by mouth 3 (three) times daily., Disp: 90 tablet, Rfl: 3   LORazepam (ATIVAN) 0.5 MG tablet, Take 1 tablet (0.5 mg total) by mouth every 6 (six) hours as needed for anxiety (and nausea). AS NEEDED FOR NAUSEA, Disp: 45 tablet, Rfl: 0   morphine (MS CONTIN) 15 MG 12 hr tablet, Take 1 tablet (15 mg total) by mouth every 8 (eight) hours., Disp: 90 tablet, Rfl: 0   ondansetron (ZOFRAN) 8 MG tablet, Take 1 tablet (8 mg total) by mouth every 8 (eight) hours as needed for nausea or vomiting., Disp: 45 tablet, Rfl: 2   Oxycodone HCl 10 MG TABS, Take 1/2 to 1 tablet (5-10 mg total) by mouth every 4 (four) hours as needed., Disp: 45 tablet, Rfl: 0   polyethylene glycol powder (MIRALAX) 17 GM/SCOOP powder, Take 17 g by mouth daily as needed., Disp: 238 g, Rfl: 3   potassium chloride SA (KLOR-CON M) 20 MEQ tablet, Take 1 tablet (20 mEq total) by mouth daily., Disp: 30 tablet, Rfl: 0   tucatinib (TUKYSA) 150 MG tablet, Take 2 tablets (300 mg total) by mouth 2 (two) times daily., Disp: 120 tablet, Rfl: 1   albuterol (VENTOLIN HFA) 108 (90 Base) MCG/ACT inhaler, Inhale 2 puffs into the lungs every 6 (six) hours as needed for wheezing or shortness of breath.  (Patient not taking: Reported on 11/18/2021), Disp: 8.5 g, Rfl: 2   benzoyl peroxide-erythromycin (BENZAMYCIN) gel, Apply topically 2 (two) times daily. (Patient not taking: Reported on 09/09/2021), Disp: 46.6 g, Rfl: 0   butalbital-acetaminophen-caffeine (FIORICET) 50-325-40 MG tablet, Take 1-2 tablets by mouth every 8 (eight) hours as needed for headache., Disp: 30 tablet, Rfl: 0   dexamethasone (DECADRON) 4 MG tablet, Take 2 tablets (8 mg total) by mouth 2 (two) times daily with a meal. (Patient not taking: Reported on 12/26/2021), Disp: 56 tablet,  Rfl: 0   diphenoxylate-atropine (LOMOTIL) 2.5-0.025 MG tablet, Take 1 tablet by mouth 4 (four) times daily as needed for diarrhea or loose stools. (Patient not taking: Reported on 11/18/2021), Disp: 40 tablet, Rfl: 0   DULoxetine (CYMBALTA) 60 MG capsule, Take 1 capsule (60 mg total) by mouth daily. (Patient not taking: Reported on 11/18/2021), Disp: 30 capsule, Rfl: 3   lidocaine-prilocaine (EMLA) cream, Apply 1 application topically as needed. Apply small amount to port site at least 1 hour prior to it being accessed, cover with plastic wrap, Disp: 30 g, Rfl: 3   naloxone (NARCAN) nasal spray 4 mg/0.1 mL, Administer one spray in nostril for opioid overdose. If unresponsive after 1 dose, may repeat a dose every 2-3 minutes in alternating nostrils until emergency services arrive. (Patient not taking: Reported on 10/07/2021), Disp: 2 each, Rfl: 3   OLANZapine (ZYPREXA) 10 MG tablet, Take 1 tablet (10 mg total) by mouth at bedtime as needed (nausea). (Patient not taking: Reported on 11/18/2021), Disp: 30 tablet, Rfl: 2   omeprazole (PRILOSEC) 20 MG capsule, Take 1 capsule (20 mg total) by mouth daily. (Patient not taking: Reported on 11/18/2021), Disp: 30 capsule, Rfl: 3 No current facility-administered medications for this visit.  Facility-Administered Medications Ordered in Other Visits:    0.9 %  sodium chloride infusion, , Intravenous, Continuous, Burns, Wandra Feinstein,  NP, Stopped at 06/02/20 1453   heparin lock flush 100 unit/mL, 500 Units, Intravenous, Once, Sindy Guadeloupe, MD   heparin lock flush 100 unit/mL, 500 Units, Intravenous, Once, Sindy Guadeloupe, MD   sodium chloride flush (NS) 0.9 % injection 10 mL, 10 mL, Intravenous, Once, Sindy Guadeloupe, MD   sodium chloride flush (NS) 0.9 % injection 10 mL, 10 mL, Intravenous, Once, Sindy Guadeloupe, MD  Physical exam:  Vitals:   12/30/21 1203  BP: 121/86  Pulse: 66  Resp: 18  Temp: 98.2 F (36.8 C)  SpO2: 99%  Weight: 139 lb 4.8 oz (63.2 kg)   Physical Exam Cardiovascular:     Rate and Rhythm: Normal rate and regular rhythm.     Heart sounds: Normal heart sounds.  Pulmonary:     Effort: Pulmonary effort is normal.     Breath sounds: Normal breath sounds.  Abdominal:     General: Bowel sounds are normal.     Palpations: Abdomen is soft.  Skin:    General: Skin is warm and dry.  Neurological:     Mental Status: She is alert and oriented to person, place, and time.         Latest Ref Rng & Units 11/18/2021    8:53 AM  CMP  Glucose 70 - 99 mg/dL 156   BUN 6 - 20 mg/dL 10   Creatinine 0.44 - 1.00 mg/dL 0.59   Sodium 135 - 145 mmol/L 135   Potassium 3.5 - 5.1 mmol/L 3.2   Chloride 98 - 111 mmol/L 105   CO2 22 - 32 mmol/L 21   Calcium 8.9 - 10.3 mg/dL 8.3   Total Protein 6.5 - 8.1 g/dL 7.1   Total Bilirubin 0.3 - 1.2 mg/dL 0.3   Alkaline Phos 38 - 126 U/L 77   AST 15 - 41 U/L 44   ALT 0 - 44 U/L 24       Latest Ref Rng & Units 11/18/2021    8:53 AM  CBC  WBC 4.0 - 10.5 K/uL 4.9   Hemoglobin 12.0 - 15.0 g/dL 12.3   Hematocrit 36.0 -  46.0 % 37.3   Platelets 150 - 400 K/uL 318     No images are attached to the encounter.  MR Brain W Wo Contrast  Result Date: 12/14/2021 CLINICAL DATA:  Follow-up metastatic breast cancer EXAM: MRI HEAD WITHOUT AND WITH CONTRAST TECHNIQUE: Multiplanar, multiecho pulse sequences of the brain and surrounding structures were obtained without and with  intravenous contrast. CONTRAST:  6 cc of vueway COMPARISON:  Brain MRI 09/02/2021 FINDINGS: BRAIN New Lesions: None. Larger lesions: 1. 7 mm lesion in the inferior left cerebellum on 18:21, previously 4 mm 2. 11 mm lesion in the posterior left cerebellum on 18:34, previously 6 mm 3. 7 mm lesion with daughter nodule laterally in the right cerebellum on 18:34 4. Bilobed lesion in maximal measuring 7 mm in the peripheral right cerebellum on 18:35, previously 4 mm 5. Bilobed lesion in the inferior right occipital cortex measuring 7 mm on 18:62 6. 5 mm lesion in the right temporal occipital cortex on 18:65 7. 11 mm lesion in the high right frontal cortex on 18:130, previously 9 mm 8. 15 mm left frontal operculum lesion on 18:78, previously 12 mm 9. 6-7 mm lesion in the higher left frontal lobe on 18:102. 10. 5 mm lesion in the high left frontal cortex on 18:114 11. 11 mm lesion in the high left frontal cortex on 18:133, previously 9 mm 12. Left parietal cortically based lesion on 18:89 measuring 7 mm, previously 5-6 13. Lateral left parietal cortically based lesion on 18:106 measuring up to 17 mm, previously 7 mm in the span. Stable or Smaller lesions: Ovoid lesion along the surface of the right frontal lobe on 18:90, 14 x 7 mm Other Brain findings: Patchy vasogenic edema greatest in the bilateral frontal lobes and in the left more than right cerebellum, increased from before and up to moderate in the frontal region. No acute infarct. Chronic blood products associated with a right frontal metastasis. No acute hemorrhage. No hydrocephalus or collection. Vascular: Major flow voids and vascular enhancements are preserved Skull and upper cervical spine: Stable Sinuses/Orbits: Negative IMPRESSION: Most of the patient's 14 brain lesions have increased in size by a few millimeters, with associated multifocal increase in vasogenic edema. No new metastatic deposit. Electronically Signed   By: Jorje Guild M.D.   On: 12/14/2021  20:27     Assessment and plan- Patient is a 41 y.o. 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  She she is here to discuss further management of HER2 positive metastatic breast cancer  I reviewed the results of MRI brain with the patient which shows no new brain lesions but most of her existing 14 brain lesions have grown by a few millimeters.  This is concerning for disease progression despite starting tucatinib Xeloda Herceptin regimen about 2-1/2 months ago.  Patient has met with radiation oncology and will be going through a second round of radiation to her brain.  I will plan to repeat her MRI brain roughly 2 months after radiation is completed.  For now my plan is to not change her systemic management and continue to catnap Xeloda and Herceptin.  Upon verifying clinical trial data both Herceptin and tucatinib can be continued with whole brain radiation but Xeloda will need to be held.  She will therefore come next week to receive her Herceptin as planned and I will see her back roughly 4 weeks from today for her following cycle of Herceptin.  I am planning to repeat CT chest abdomen and pelvis with contrast and bone scan as well next month   Visit Diagnosis 1. Primary malignant neoplasm of breast with metastasis ([place])   2. Multiple pathological fractures, initial encounter      Dr. Randa Evens, MD, MPH [facility] at [facility] [phone] 01/01/2022 5:09 PM

## 2022-01-04 ENCOUNTER — Inpatient Hospital Stay: Payer: Self-pay

## 2022-01-05 ENCOUNTER — Telehealth: Payer: Self-pay

## 2022-01-05 ENCOUNTER — Other Ambulatory Visit: Payer: Self-pay

## 2022-01-05 ENCOUNTER — Ambulatory Visit
Admission: RE | Admit: 2022-01-05 | Discharge: 2022-01-05 | Disposition: A | Discharge status: Home / Self Care | Payer: Self-pay | Source: Ambulatory Visit | Attending: Radiation Oncology | Admitting: Radiation Oncology

## 2022-01-05 LAB — RAD ONC ARIA SESSION SUMMARY
Course Elapsed Days: 0
Plan Fractions Treated to Date: 1
Plan Prescribed Dose Per Fraction: 3 Gy
Plan Total Fractions Prescribed: 10
Plan Total Prescribed Dose: 30 Gy
Reference Point Dosage Given to Date: 3 Gy
Reference Point Session Dosage Given: 3 Gy
Session Number: 1

## 2022-01-05 NOTE — Telephone Encounter (Signed)
Received a VM from [place] at [place] regarding pt and not being able to reach her. I informed Cindra Eves that Dr. Janese Banks does not want pt to see [facility] at this time and we can cancel the referral. Cindra Eves thanked me for returning her call and she will be cancelling referral.

## 2022-01-06 ENCOUNTER — Other Ambulatory Visit: Payer: Self-pay

## 2022-01-06 ENCOUNTER — Ambulatory Visit
Admission: RE | Admit: 2022-01-06 | Discharge: 2022-01-06 | Disposition: A | Discharge status: Home / Self Care | Payer: Self-pay | Source: Ambulatory Visit | Attending: Radiation Oncology | Admitting: Radiation Oncology

## 2022-01-06 LAB — RAD ONC ARIA SESSION SUMMARY
Course Elapsed Days: 1
Plan Fractions Treated to Date: 2
Plan Prescribed Dose Per Fraction: 3 Gy
Plan Total Fractions Prescribed: 10
Plan Total Prescribed Dose: 30 Gy
Reference Point Dosage Given to Date: 6 Gy
Reference Point Session Dosage Given: 3 Gy
Session Number: 2

## 2022-01-09 ENCOUNTER — Other Ambulatory Visit: Payer: Self-pay

## 2022-01-09 ENCOUNTER — Other Ambulatory Visit (HOSPITAL_COMMUNITY): Payer: Self-pay | Admitting: Nurse Practitioner

## 2022-01-09 ENCOUNTER — Encounter: Payer: Self-pay | Admitting: Oncology

## 2022-01-09 ENCOUNTER — Ambulatory Visit
Admission: RE | Admit: 2022-01-09 | Discharge: 2022-01-09 | Disposition: A | Discharge status: Home / Self Care | Payer: Self-pay | Source: Ambulatory Visit | Attending: Radiation Oncology | Admitting: Radiation Oncology

## 2022-01-09 LAB — RAD ONC ARIA SESSION SUMMARY
Course Elapsed Days: 4
Plan Fractions Treated to Date: 3
Plan Prescribed Dose Per Fraction: 3 Gy
Plan Total Fractions Prescribed: 10
Plan Total Prescribed Dose: 30 Gy
Reference Point Dosage Given to Date: 9 Gy
Reference Point Session Dosage Given: 3 Gy
Session Number: 3

## 2022-01-09 MED ORDER — MORPHINE SULFATE ER 15 MG PO TBCR
15.0000 mg | EXTENDED_RELEASE_TABLET | Freq: Three times a day (TID) | ORAL | 0 refills | Status: DC
  Filled 2022-01-09: qty 90, 30d supply, fill #0

## 2022-01-10 ENCOUNTER — Ambulatory Visit
Admission: RE | Admit: 2022-01-10 | Discharge: 2022-01-10 | Disposition: A | Discharge status: Home / Self Care | Payer: Self-pay | Source: Ambulatory Visit | Attending: Radiation Oncology | Admitting: Radiation Oncology

## 2022-01-10 ENCOUNTER — Inpatient Hospital Stay: Payer: Self-pay

## 2022-01-10 ENCOUNTER — Other Ambulatory Visit: Payer: Self-pay

## 2022-01-10 LAB — RAD ONC ARIA SESSION SUMMARY
Course Elapsed Days: 5
Plan Fractions Treated to Date: 4
Plan Prescribed Dose Per Fraction: 3 Gy
Plan Total Fractions Prescribed: 10
Plan Total Prescribed Dose: 30 Gy
Reference Point Dosage Given to Date: 12 Gy
Reference Point Session Dosage Given: 3 Gy
Session Number: 4

## 2022-01-11 ENCOUNTER — Other Ambulatory Visit: Payer: Self-pay

## 2022-01-11 ENCOUNTER — Ambulatory Visit
Admission: RE | Admit: 2022-01-11 | Discharge: 2022-01-11 | Disposition: A | Discharge status: Home / Self Care | Payer: Self-pay | Source: Ambulatory Visit | Attending: Radiation Oncology | Admitting: Radiation Oncology

## 2022-01-11 ENCOUNTER — Inpatient Hospital Stay: Payer: Self-pay

## 2022-01-11 LAB — RAD ONC ARIA SESSION SUMMARY
Course Elapsed Days: 6
Plan Fractions Treated to Date: 5
Plan Prescribed Dose Per Fraction: 3 Gy
Plan Total Fractions Prescribed: 10
Plan Total Prescribed Dose: 30 Gy
Reference Point Dosage Given to Date: 15 Gy
Reference Point Session Dosage Given: 3 Gy
Session Number: 5

## 2022-01-12 ENCOUNTER — Inpatient Hospital Stay: Payer: Self-pay

## 2022-01-12 ENCOUNTER — Other Ambulatory Visit: Payer: Self-pay

## 2022-01-12 ENCOUNTER — Ambulatory Visit
Admission: RE | Admit: 2022-01-12 | Discharge: 2022-01-12 | Disposition: A | Discharge status: Home / Self Care | Payer: Self-pay | Source: Ambulatory Visit | Attending: Radiation Oncology | Admitting: Radiation Oncology

## 2022-01-12 LAB — RAD ONC ARIA SESSION SUMMARY
Course Elapsed Days: 7
Plan Fractions Treated to Date: 6
Plan Prescribed Dose Per Fraction: 3 Gy
Plan Total Fractions Prescribed: 10
Plan Total Prescribed Dose: 30 Gy
Reference Point Dosage Given to Date: 18 Gy
Reference Point Session Dosage Given: 3 Gy
Session Number: 6

## 2022-01-13 ENCOUNTER — Encounter: Payer: Self-pay | Admitting: Oncology

## 2022-01-13 ENCOUNTER — Inpatient Hospital Stay: Payer: Self-pay

## 2022-01-13 ENCOUNTER — Other Ambulatory Visit: Payer: Self-pay

## 2022-01-13 ENCOUNTER — Ambulatory Visit
Admission: RE | Admit: 2022-01-13 | Discharge: 2022-01-13 | Disposition: A | Discharge status: Home / Self Care | Payer: Self-pay | Source: Ambulatory Visit | Attending: Radiation Oncology | Admitting: Radiation Oncology

## 2022-01-13 ENCOUNTER — Other Ambulatory Visit: Payer: Self-pay | Admitting: Nurse Practitioner

## 2022-01-13 VITALS — BP 116/70 | HR 84 | Temp 99.5°F | Resp 18 | Wt 142.0 lb

## 2022-01-13 DIAGNOSIS — M8440XA Pathological fracture, unspecified site, initial encounter for fracture: Secondary | ICD-10-CM

## 2022-01-13 DIAGNOSIS — R1013 Epigastric pain: Secondary | ICD-10-CM

## 2022-01-13 DIAGNOSIS — C50919 Malignant neoplasm of unspecified site of unspecified female breast: Secondary | ICD-10-CM

## 2022-01-13 LAB — RAD ONC ARIA SESSION SUMMARY
Course Elapsed Days: 8
Plan Fractions Treated to Date: 7
Plan Prescribed Dose Per Fraction: 3 Gy
Plan Total Fractions Prescribed: 10
Plan Total Prescribed Dose: 30 Gy
Reference Point Dosage Given to Date: 21 Gy
Reference Point Session Dosage Given: 3 Gy
Session Number: 7

## 2022-01-13 MED ORDER — ACETAMINOPHEN 325 MG PO TABS
650.0000 mg | ORAL_TABLET | Freq: Once | ORAL | Status: AC
  Administered 2022-01-13: 650 mg via ORAL

## 2022-01-13 MED ORDER — OMEPRAZOLE 20 MG PO CPDR
20.0000 mg | DELAYED_RELEASE_CAPSULE | Freq: Every day | ORAL | 3 refills | Status: DC
  Filled 2022-01-13: qty 30, 30d supply, fill #0
  Filled 2022-02-08: qty 30, 30d supply, fill #1
  Filled 2022-03-21: qty 30, 30d supply, fill #2

## 2022-01-13 MED ORDER — DIPHENHYDRAMINE HCL 25 MG PO CAPS
50.0000 mg | ORAL_CAPSULE | Freq: Once | ORAL | Status: AC
  Administered 2022-01-13: 50 mg via ORAL

## 2022-01-13 MED ORDER — TRASTUZUMAB-ANNS CHEMO 150 MG IV SOLR
6.0000 mg/kg | Freq: Once | INTRAVENOUS | Status: AC
  Administered 2022-01-13: 378 mg via INTRAVENOUS
  Filled 2022-01-13: qty 18

## 2022-01-13 MED ORDER — SODIUM CHLORIDE 0.9 % IV SOLN
Freq: Once | INTRAVENOUS | Status: AC
  Filled 2022-01-13: qty 250

## 2022-01-13 MED ORDER — HEPARIN SOD (PORK) LOCK FLUSH 100 UNIT/ML IV SOLN
500.0000 [IU] | Freq: Once | INTRAVENOUS | Status: AC | PRN
  Administered 2022-01-13: 500 [IU]
  Filled 2022-01-13: qty 5

## 2022-01-13 NOTE — Progress Notes (Signed)
Saw patient in infusion. Complaining of acid reflux and dyspepsia. Denies nausea. Will refill omeprazole which she was previously taking for same symptoms. Reviewed need to take daily. If worsens, notify clinic for evaluation.

## 2022-01-16 ENCOUNTER — Inpatient Hospital Stay: Payer: Self-pay

## 2022-01-16 ENCOUNTER — Other Ambulatory Visit: Payer: Self-pay

## 2022-01-16 ENCOUNTER — Ambulatory Visit
Admission: RE | Admit: 2022-01-16 | Discharge: 2022-01-16 | Disposition: A | Discharge status: Home / Self Care | Payer: Self-pay | Source: Ambulatory Visit | Attending: Radiation Oncology | Admitting: Radiation Oncology

## 2022-01-16 LAB — RAD ONC ARIA SESSION SUMMARY
Course Elapsed Days: 11
Plan Fractions Treated to Date: 8
Plan Prescribed Dose Per Fraction: 3 Gy
Plan Total Fractions Prescribed: 10
Plan Total Prescribed Dose: 30 Gy
Reference Point Dosage Given to Date: 24 Gy
Reference Point Session Dosage Given: 3 Gy
Session Number: 8

## 2022-01-17 ENCOUNTER — Other Ambulatory Visit (HOSPITAL_COMMUNITY): Payer: Self-pay

## 2022-01-17 ENCOUNTER — Ambulatory Visit
Admission: RE | Admit: 2022-01-17 | Discharge: 2022-01-17 | Disposition: A | Discharge status: Home / Self Care | Payer: Self-pay | Source: Ambulatory Visit | Attending: Radiation Oncology | Admitting: Radiation Oncology

## 2022-01-17 ENCOUNTER — Other Ambulatory Visit: Payer: Self-pay

## 2022-01-17 LAB — RAD ONC ARIA SESSION SUMMARY
Course Elapsed Days: 12
Plan Fractions Treated to Date: 9
Plan Prescribed Dose Per Fraction: 3 Gy
Plan Total Fractions Prescribed: 10
Plan Total Prescribed Dose: 30 Gy
Reference Point Dosage Given to Date: 27 Gy
Reference Point Session Dosage Given: 3 Gy
Session Number: 9

## 2022-01-18 ENCOUNTER — Inpatient Hospital Stay: Payer: Self-pay

## 2022-01-18 ENCOUNTER — Ambulatory Visit
Admission: RE | Admit: 2022-01-18 | Discharge: 2022-01-18 | Disposition: A | Discharge status: Home / Self Care | Payer: Self-pay | Source: Ambulatory Visit | Attending: Radiation Oncology | Admitting: Radiation Oncology

## 2022-01-18 ENCOUNTER — Other Ambulatory Visit: Payer: Self-pay

## 2022-01-18 DIAGNOSIS — C50912 Malignant neoplasm of unspecified site of left female breast: Secondary | ICD-10-CM | POA: Insufficient documentation

## 2022-01-18 DIAGNOSIS — C7931 Secondary malignant neoplasm of brain: Secondary | ICD-10-CM | POA: Insufficient documentation

## 2022-01-18 DIAGNOSIS — C7951 Secondary malignant neoplasm of bone: Secondary | ICD-10-CM | POA: Insufficient documentation

## 2022-01-18 DIAGNOSIS — Z510 Encounter for antineoplastic radiation therapy: Secondary | ICD-10-CM | POA: Insufficient documentation

## 2022-01-18 DIAGNOSIS — Z171 Estrogen receptor negative status [ER-]: Secondary | ICD-10-CM | POA: Insufficient documentation

## 2022-01-18 LAB — RAD ONC ARIA SESSION SUMMARY
Course Elapsed Days: 13
Plan Fractions Treated to Date: 10
Plan Prescribed Dose Per Fraction: 3 Gy
Plan Total Fractions Prescribed: 10
Plan Total Prescribed Dose: 30 Gy
Reference Point Dosage Given to Date: 30 Gy
Reference Point Session Dosage Given: 3 Gy
Session Number: 10

## 2022-01-19 ENCOUNTER — Other Ambulatory Visit (HOSPITAL_COMMUNITY): Payer: Self-pay

## 2022-01-24 ENCOUNTER — Other Ambulatory Visit (HOSPITAL_COMMUNITY): Payer: Self-pay

## 2022-01-25 ENCOUNTER — Inpatient Hospital Stay: Payer: Self-pay | Attending: Oncology | Admitting: Oncology

## 2022-01-25 ENCOUNTER — Other Ambulatory Visit: Payer: Self-pay | Admitting: Oncology

## 2022-01-25 ENCOUNTER — Encounter: Payer: Self-pay | Admitting: Oncology

## 2022-01-25 ENCOUNTER — Inpatient Hospital Stay: Payer: Self-pay

## 2022-01-25 VITALS — BP 114/80 | HR 92 | Resp 16 | Wt 141.0 lb

## 2022-01-25 VITALS — Temp 99.7°F

## 2022-01-25 DIAGNOSIS — C50412 Malignant neoplasm of upper-outer quadrant of left female breast: Secondary | ICD-10-CM | POA: Insufficient documentation

## 2022-01-25 DIAGNOSIS — Z5112 Encounter for antineoplastic immunotherapy: Secondary | ICD-10-CM | POA: Insufficient documentation

## 2022-01-25 DIAGNOSIS — C7951 Secondary malignant neoplasm of bone: Secondary | ICD-10-CM | POA: Insufficient documentation

## 2022-01-25 DIAGNOSIS — Z23 Encounter for immunization: Secondary | ICD-10-CM | POA: Insufficient documentation

## 2022-01-25 DIAGNOSIS — Z171 Estrogen receptor negative status [ER-]: Secondary | ICD-10-CM | POA: Insufficient documentation

## 2022-01-25 DIAGNOSIS — C7931 Secondary malignant neoplasm of brain: Secondary | ICD-10-CM | POA: Insufficient documentation

## 2022-01-25 DIAGNOSIS — C50919 Malignant neoplasm of unspecified site of unspecified female breast: Secondary | ICD-10-CM

## 2022-01-25 DIAGNOSIS — M8440XA Pathological fracture, unspecified site, initial encounter for fracture: Secondary | ICD-10-CM

## 2022-01-25 MED ORDER — HEPARIN SOD (PORK) LOCK FLUSH 100 UNIT/ML IV SOLN
500.0000 [IU] | Freq: Once | INTRAVENOUS | Status: AC | PRN
  Administered 2022-01-25: 500 [IU]
  Filled 2022-01-25: qty 5

## 2022-01-25 MED ORDER — SODIUM CHLORIDE 0.9 % IV SOLN
Freq: Once | INTRAVENOUS | Status: AC
  Filled 2022-01-25: qty 250

## 2022-01-25 MED ORDER — INFLUENZA VAC SPLIT QUAD 0.5 ML IM SUSY
0.5000 mL | PREFILLED_SYRINGE | Freq: Once | INTRAMUSCULAR | Status: AC
  Administered 2022-01-25: 0.5 mL via INTRAMUSCULAR
  Filled 2022-01-25: qty 0.5

## 2022-01-25 MED ORDER — ACETAMINOPHEN 325 MG PO TABS
650.0000 mg | ORAL_TABLET | Freq: Once | ORAL | Status: AC
  Administered 2022-01-25: 650 mg via ORAL
  Filled 2022-01-25: qty 2

## 2022-01-25 MED ORDER — DIPHENHYDRAMINE HCL 25 MG PO CAPS
50.0000 mg | ORAL_CAPSULE | Freq: Once | ORAL | Status: AC
  Administered 2022-01-25: 50 mg via ORAL
  Filled 2022-01-25: qty 2

## 2022-01-25 MED ORDER — TRASTUZUMAB-ANNS CHEMO 150 MG IV SOLR
6.0000 mg/kg | Freq: Once | INTRAVENOUS | Status: AC
  Administered 2022-01-25: 378 mg via INTRAVENOUS
  Filled 2022-01-25: qty 18

## 2022-01-25 NOTE — Patient Instructions (Signed)
Instrucciones al darle de alta: Discharge Instructions Gracias por elegir al [facility] de Cncer de [place] para brindarle atencin mdica de oncologa y Music therapist.   Si usted tiene una cita de laboratorio con [place], por favor vaya directamente [place] y regstrese en el rea de Control and instrumentation engineer.   Use ropa cmoda y [place] para tener fcil acceso a las vas del Portacath (acceso venoso de Engineer, site duracin) o la lnea PICC (catter central colocado por va perifrica).   Nos esforzamos por ofrecerle tiempo de calidad con su proveedor. Es posible que tenga que volver a programar su cita si llega tarde (15 minutos o ms).  El llegar tarde le afecta a usted y a otros pacientes cuyas citas son posteriores a Merchandiser, retail.  Adems, si usted falta a tres o ms citas sin avisar a la oficina, puede ser retirado(a) de la clnica a discrecin del proveedor.      Para las solicitudes de renovacin de recetas, pida a su farmacia que se ponga en contacto con nuestra oficina y deje que transcurran 3 horas para que se complete el proceso de las renovaciones.    Hoy usted recibi los siguientes agentes de quimioterapia e/o inmunoterapia KANJINTI      Para ayudar a prevenir las nuseas y los vmitos despus de su tratamiento, le recomendamos que tome su medicamento para las nuseas segn las indicaciones.  LOS SNTOMAS QUE DEBEN COMUNICARSE INMEDIATAMENTE SE INDICAN A CONTINUACIN: *FIEBRE SUPERIOR A 100.4 F (38 C) O MS *ESCALOFROS O SUDORACIN *NUSEAS Y VMITOS QUE NO SE CONTROLAN CON EL MEDICAMENTO PARA LAS NUSEAS *DIFICULTAD INUSUAL PARA RESPIRAR  *MORETONES O HEMORRAGIAS NO HABITUALES *PROBLEMAS URINARIOS (dolor o ardor al Garment/textile technologist o frecuencia para Garment/textile technologist) *PROBLEMAS INTESTINALES (diarrea inusual, estreimiento, dolor cerca del ano) SENSIBILIDAD EN LA BOCA Y EN LA GARGANTA CON O SIN LA PRESENCIA DE LCERAS (dolor de garganta, llagas en la boca o dolor de muelas/dientes) ERUPCIN,  HINCHAZN O DOLORES INUSUALES FLUJO VAGINAL INUSUAL O PICAZN/RASQUIA    Los puntos marcados con un asterisco ( *) indican una posible emergencia y debe hacer un seguimiento tan pronto como le sea posible o vaya al Departamento de Emergencias si se le presenta algn problema.  Por favor, muestre la [place] DE ADVERTENCIA DE Windy Canny DE ADVERTENCIA DE Benay Spice al registrarse en [address] de Emergencias y a la enfermera de triaje.  Si tiene preguntas despus de su visita o necesita cancelar o volver a programar su cita, por favor pngase en contacto con [facility] [place]  [phone]  y [place] instrucciones. Las horas de oficina son de 8:00 a.m. a 4:30 p.m. de lunes a viernes. Por favor, tenga en cuenta que los mensajes de voz que se dejan despus de las 4:00 p.m. posiblemente no se devolvern hasta el siguiente da de [employer].  Cerramos los fines de semana y [employer]. En todo momento tiene acceso a una enfermera para preguntas urgentes. Por favor, llame al nmero principal de la clnica  [phone] y [place] instrucciones.   Para cualquier pregunta que no sea de carcter urgente, tambin puede ponerse en contacto con su proveedor [employer]. Ahora ofrecemos visitas electrnicas para cualquier persona mayor de 18 aos que solicite atencin mdica en lnea para los sntomas que no sean urgentes. Para ms detalles vaya a [email or web address].   Tambin puede bajar la aplicacin de MyChart! Vaya a la tienda de aplicaciones, busque "MyChart", abra la aplicacin, seleccione  [place], e ingrese con su nombre de usuario y la contrasea de Pharmacist, community.  Las mscaras son opcionales en los centros de Hotel manager. Si desea que su equipo de cuidados mdicos use una [employer] atienden, por favor hgaselo saber al personal. Bethann Berkshire una persona de apoyo que tenga por lo menos 16 aos para que le acompae a sus  citas.  Trastuzumab Injection Qu es este medicamento? El TRASTUZUMAB es un anticuerpo monoclonal. Genene Churn para tratar el cncer de mama y de [place]. Este medicamento puede ser utilizado para otros usos; si tiene alguna pregunta consulte con su proveedor de atencin mdica o con su farmacutico. MARCAS COMUNES: Herceptin, Belenda Cruise, [place], [place], [place]era Qu le debo informar a mi profesional de la salud antes de tomar este medicamento? Necesitan saber si usted presenta alguno de los siguientes problemas o situaciones: enfermedad cardiaca insuficiencia cardiaca enfermedad pulmonar o respiratoria, como asma una reaccin alrgica o inusual al trastuzumab, al alcohol benclico, a otros medicamentos, alimentos, colorantes o conservantes si est embarazada o buscando quedar embarazada si est amamantando a un beb Cmo debo utilizar este medicamento? Este medicamento se administra mediante infusin por va intravenosa. Lo administra un profesional de la salud calificado en un hospital o en un entorno clnico. Hable con su pediatra para informarse acerca del uso de este medicamento en nios. Este medicamento no est aprobado para uso en nios. Sobredosis: Pngase en contacto inmediatamente con un centro toxicolgico o una sala de urgencia si usted cree que haya tomado demasiado medicamento. ATENCIN: [employer] es solo para usted. No comparta este medicamento con nadie. Qu sucede si me olvido de una dosis? Es importante no olvidar ninguna dosis. Informe a su mdico o a su profesional de la salud si no puede asistir a Photographer. Qu puede interactuar con este medicamento? Este medicamento podra interactuar con los siguientes frmacos: ciertos tipos de quimioterapia, tales como daunorubicina, [place], [place], e idarubicina Puede ser que esta lista no menciona todas las posibles interacciones. Informe a su profesional de [employer] Home	[place] de [employer] productos a base  de hierbas, medicamentos de [place] o suplementos nutritivos que est tomando. Si usted fuma, consume bebidas alcohlicas o si utiliza drogas ilegales, indqueselo tambin a su profesional de [employer]	[place]. Algunas sustancias pueden interactuar con su medicamento. A qu debo estar atento al usar [employer]? Visite a su mdico para chequear su evolucin peridicamente. Si presenta algn efecto secundario, infrmelo. Sin embargo, contine con el tratamiento aun si se siente enfermo, a menos que su mdico le indique que lo suspenda. Consulte a su mdico o a su profesional de la salud si tiene fiebre, escalofros, dolor de garganta, o cualquier otro sntoma de resfro o gripe. No se trate usted mismo. Trate de no acercarse a personas que estn enfermas. Es posible que tenga [place], escalofros y temblores durante su primera infusin. Estos efectos generalmente son leves y pueden tratarse con otros medicamentos. Informe cualquier efecto secundario durante la infusin a su profesional de [employer]	[place]. La fiebre y los escalofros por lo general no suceden con las infusiones posteriores. No debe quedar embarazada mientras est tomando este medicamento o por 7 meses despus de dejar de usarlo. Las mujeres deben informar a su mdico si estn buscando quedar embarazadas o si creen que estn embarazadas. Las mujeres con la posibilidad de Best boy nios deben tener una prueba de embarazo negativa antes de Art gallery manager a tomar este medicamento. Existe la posibilidad de que ocurran efectos secundarios graves a un beb  sin nacer. Para ms informacin hable con su profesional de la salud o su farmacutico. No debe amamantar a un beb mientras est tomando este medicamento o por 7 meses despus de dejar de usarlo. Las [employer] deben usar un mtodo anticonceptivo eficaz con este medicamento. Qu efectos secundarios puedo tener al [employer] este medicamento? Efectos secundarios que debe informar a su mdico o a Barrister's clerk de la  salud tan pronto como sea posible: Chief of Staff, como erupcin cutnea, comezn/picazn o urticaria, e hinchazn de la cara, los labios o la lengua dolor en el pecho o palpitaciones tos mareos sensacin de Youth worker o aturdimiento, cadas fiebre sensacin general de estar enfermo o sntomas gripales signos de empeoramiento de la insuficiencia cardiaca, tales como problemas respiratorios; hinchazn en las piernas y los pies cansancio o debilidad inusual Efectos secundarios que generalmente no requieren atencin mdica (infrmelos a su mdico o a Barrister's clerk de la salud si persisten o si son molestos): dolor de Psychiatric nurse sentido del gusto diarrea dolor en las articulaciones nuseas, vmito prdida de peso Puede ser que esta lista no menciona todos los posibles efectos secundarios. Comunquese a su mdico por asesoramiento mdico [employer]. Usted puede informar los efectos secundarios a la FDA por telfono al 1-800-FDA-1088. Dnde debo guardar mi medicina? Este medicamento se administra en hospitales o clnicas y no necesitar guardarlo en su domicilio. ATENCIN: Este folleto es un resumen. Puede ser que no cubra toda la posible informacin. Si usted tiene preguntas acerca de esta medicina, consulte con su mdico, su farmacutico o su profesional de Technical sales engineer.  2023 Elsevier/Gold Standard (2021-05-31 00:00:00)

## 2022-01-25 NOTE — Progress Notes (Signed)
Pt states she has noticed more darkening around her forehead along with small blisters and has noticed her hair is falling out more and believes it is causing headaces. Has also noticed, more abdominal pain after her txs but does not cause nausea or constipation. Sense of taste is gone and can no longer taste food.

## 2022-01-25 NOTE — Progress Notes (Signed)
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  History obtained with the help of an interpreter.Patient self palpated left breast mass which was followed by a diagnostic bilateral mammogram.  Mammogram showed 3.1 x 2.9 x 1.9 cm hypoechoic mass at the 1 o'clock position of the left breast.  For abnormal cortically thickened left axillary lymph nodes measuring up to 5 mm.  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  Patient was also having ongoing back pain and was seen by [facility] orthopedics Dr. Doyle Askew who ordered MRI lumbar spine without contrast which showed possible pathologic fractures of L1 and L4 vertebral bodies with greater than 50% height loss at L1 and abnormal signal involving L2-L3 S1 as well as right iliac bone concerning for metastatic disease.  Patient is a single mother of 3 adult children and is very anxious today.  She reports significant back pain which radiates to her bilateral thighs.  Denies any focal tingling numbness or weakness.  Denies any bowel bladder incontinence.  Pain has been uncontrolled despite taking Tylenol.  No prior history of abnormal breast biopsies.  No family history of breast  cancer   PET and MRI showed 3 areas of pathologic fracture of her spine as well as widespread bony metastatic disease and concern for impending fracture of the right hip.  Given her worsening pain she was asked to come to the ER.  She has been evaluated by Dr. Rudene Christians from orthopedic surgery and underwent kyphoplasty at 3 different levels. T6 L1 and L4 along with radiofrequency ablation.    She also underwent prophylactic fixation of the right hip and not affected the sacral region.     Patient received first dose of Herceptin and Perjeta on 09/04/2019.  Patient initially received Taxol and Herceptin perjeta.  Presently patient is on maintenance Herceptin and Perjeta alone.  Patient found to have brain mets in November 2022 for which she received whole brain radiation treatment but there was no evidence of systemic progression.  She was continued on Herceptin and Perjeta.  Repeat MRI in June 2023 showed slight progression of brain mets and plan is to switch her to Herceptin tucatinib Xeloda  Interval history-headaches have improved.  She tolerated whole brain radiation well.  She does report some scalp sensitivity.  History obtained with the help of Spanish interpreter.  Hip pain and leg pain is currently well controlled  ECOG PS- 1 Pain scale- 0 Opioid associated constipation- no  Review of systems- Review of Systems  Constitutional:  Positive for malaise/fatigue. Negative for chills, fever and weight loss.  HENT:  Negative for congestion, ear discharge and nosebleeds.   Eyes:  Negative for blurred vision.  Respiratory:  Negative for cough, hemoptysis, sputum production, shortness of breath and wheezing.  Cardiovascular:  Negative for chest pain, palpitations, orthopnea and claudication.  Gastrointestinal:  Negative for abdominal pain, blood in stool, constipation, diarrhea, heartburn, melena, nausea and vomiting.  Genitourinary:  Negative for dysuria, flank pain, frequency, hematuria and urgency.   Musculoskeletal:  Negative for back pain, joint pain and myalgias.  Skin:  Negative for rash.  Neurological:  Negative for dizziness, tingling, focal weakness, seizures, weakness and headaches.  Endo/Heme/Allergies:  Does not bruise/bleed easily.  Psychiatric/Behavioral:  Negative for depression and suicidal ideas. The patient does not have insomnia.       No Known Allergies   Past Medical History:  Diagnosis Date   Anxiety    Breast cancer ([place])    with mets   Cancer ([place])    Colitis    COVID-19 in immunocompromised patient ([place])    Family history of colon cancer    Vertigo      Past Surgical History:  Procedure Laterality Date   BREAST BIOPSY Left 08/14/2019   [place] bx of mass, coil marker, path pending   BREAST BIOPSY Left 08/14/2019   [place] bx of LN, hydromarker, path pending   BREAST BIOPSY Left 08/14/2019   affirm bx of calcs, x marker, path pending   ESOPHAGOGASTRODUODENOSCOPY (EGD) WITH PROPOFOL N/A 10/05/2019   Procedure: ESOPHAGOGASTRODUODENOSCOPY (EGD) WITH PROPOFOL;  Surgeon: Lin Landsman, MD;  Location: [place];  Service: Gastroenterology;  Laterality: N/A;   FLEXIBLE SIGMOIDOSCOPY N/A 10/05/2019   Procedure: FLEXIBLE SIGMOIDOSCOPY;  Surgeon: Lin Landsman, MD;  [facility] END[facility]COPY;  Service: Gastroenterology;  Laterality: N/A;   INTRAMEDULLARY (IM) NAIL INTERTROCHANTERIC Right 09/01/2019   Procedure: INTRAMEDULLARY (IM) NAIL INTERTROCHANTRIC AND RADIOFREQUENCY ABLATION;  Surgeon: Hessie Knows, MD;  Location: [facility] ORS;  Service: Orthopedics;  Laterality: Right;   KYPHOPLASTY N/A 08/29/2019   Procedure: KYPHOPLASTY T6, L1,L4 ,  RADIOFREQUENCY ABLATION;  Surgeon: Hessie Knows, MD;  Location: [facility] ORS;  Service: Orthopedics;  Laterality: N/A;   KYPHOPLASTY Right 09/01/2019   Procedure: Right Sacral Radiofrequency Ablation and Cement Augmentation, Right sacrum and iliac crest;  Surgeon: Hessie Knows, MD;  Location: [facility] ORS;  Service: Orthopedics;   Laterality: Right;   PORTA CATH INSERTION N/A 08/28/2019   Procedure: PORTA CATH INSERTION;  Surgeon: Algernon Huxley, MD;  Location: [place] CV LAB;  Service: Cardiovascular;  Laterality: N/A;    Social History   Socioeconomic History   Marital status: Single    Spouse name: Not on file   Number of children: Not on file   Years of education: Not on file   Highest education level: Not on file  Occupational History   Not on file  Tobacco Use   Smoking status: Never   Smokeless tobacco: Never  Vaping Use   Vaping Use: Never used  Substance and Sexual Activity   Alcohol use: Not Currently   Drug use: Not Currently   Sexual activity: Not Currently    Birth control/protection: None  Other Topics Concern   Not on file  Social History Narrative   Lives at home with children   Social Determinants of Health   Financial Resource Strain: High Risk (11/18/2021)   Overall Financial Resource Strain (CARDIA)    Difficulty of Paying Living Expenses: Very hard  Food Insecurity: Food Insecurity Present (12/26/2021)   Hunger Vital Sign    Worried About Running Out of Food in the Last Year: Often true    Ran Out of Food in the Last Year: Often true  Transportation Needs: Unmet Transportation Needs (  12/26/2021)   PRAPARE - Transportation    Lack of Transportation (Medical): Yes    Lack of Transportation (Non-Medical): Yes  Physical Activity: Inactive (11/18/2021)   Exercise Vital Sign    Days of Exercise per Week: 0 days    Minutes of Exercise per Session: 0 min  Stress: Stress Concern Present (11/18/2021)   [place]    Feeling of Stress : Very much  Social Connections: Socially Isolated (11/18/2021)   Social Connection and Isolation Panel [NHANES]    Frequency of Communication with Friends and Family: Three times a week    Frequency of Social Gatherings with Friends and Family: Three times a week    Attends Religious  Services: Never    Active Member of Clubs or Organizations: No    Attends Archivist Meetings: Never    Marital Status: Never married  Intimate Partner Violence: Not At Risk (11/18/2021)   Humiliation, Afraid, Rape, and Kick questionnaire    Fear of Current or Ex-Partner: No    Emotionally Abused: No    Physically Abused: No    Sexually Abused: No    Family History  Problem Relation Age of Onset   Colon cancer Maternal Uncle      Current Outpatient Medications:    butalbital-acetaminophen-caffeine (FIORICET) 50-325-40 MG tablet, Take 1-2 tablets by mouth every 8 (eight) hours as needed for headache., Disp: 30 tablet, Rfl: 0   Calcium Carb-Cholecalciferol 600-10 MG-MCG TABS, Take 1 tablet by mouth 2 (two) times daily., Disp: 180 tablet, Rfl: 0   capecitabine (XELODA) 500 MG tablet, Take 3 tablets (1,500 mg total) by mouth 2 (two) times daily after a meal. Take for 14 days, then hold for 7 days. Repeat every 21 days., Disp: 84 tablet, Rfl: 1   gabapentin (NEURONTIN) 600 MG tablet, Take 1 tablet (600 mg total) by mouth 3 (three) times daily., Disp: 90 tablet, Rfl: 3   lidocaine-prilocaine (EMLA) cream, Apply 1 application topically as needed. Apply small amount to port site at least 1 hour prior to it being accessed, cover with plastic wrap, Disp: 30 g, Rfl: 3   LORazepam (ATIVAN) 0.5 MG tablet, Take 1 tablet (0.5 mg total) by mouth every 6 (six) hours as needed for anxiety (and nausea). AS NEEDED FOR NAUSEA, Disp: 45 tablet, Rfl: 0   morphine (MS CONTIN) 15 MG 12 hr tablet, Take 1 tablet (15 mg total) by mouth every 8 (eight) hours., Disp: 90 tablet, Rfl: 0   omeprazole (PRILOSEC) 20 MG capsule, Take 1 capsule (20 mg total) by mouth daily., Disp: 30 capsule, Rfl: 3   ondansetron (ZOFRAN) 8 MG tablet, Take 1 tablet (8 mg total) by mouth every 8 (eight) hours as needed for nausea or vomiting., Disp: 45 tablet, Rfl: 2   Oxycodone HCl 10 MG TABS, Take 1/2 to 1 tablet (5-10 mg total) by  mouth every 4 (four) hours as needed., Disp: 45 tablet, Rfl: 0   polyethylene glycol powder (MIRALAX) 17 GM/SCOOP powder, Take 17 g by mouth daily as needed., Disp: 238 g, Rfl: 3   potassium chloride SA (KLOR-CON M) 20 MEQ tablet, Take 1 tablet (20 mEq total) by mouth daily., Disp: 30 tablet, Rfl: 0   tucatinib (TUKYSA) 150 MG tablet, Take 2 tablets (300 mg total) by mouth 2 (two) times daily., Disp: 120 tablet, Rfl: 1   albuterol (VENTOLIN HFA) 108 (90 Base) MCG/ACT inhaler, Inhale 2 puffs into the lungs every 6 (six) hours  as needed for wheezing or shortness of breath. (Patient not taking: Reported on 11/18/2021), Disp: 8.5 g, Rfl: 2   benzoyl peroxide-erythromycin (BENZAMYCIN) gel, Apply topically 2 (two) times daily. (Patient not taking: Reported on 09/09/2021), Disp: 46.6 g, Rfl: 0   dexamethasone (DECADRON) 4 MG tablet, Take 2 tablets (8 mg total) by mouth 2 (two) times daily with a meal. (Patient not taking: Reported on 12/26/2021), Disp: 56 tablet, Rfl: 0   diphenoxylate-atropine (LOMOTIL) 2.5-0.025 MG tablet, Take 1 tablet by mouth 4 (four) times daily as needed for diarrhea or loose stools. (Patient not taking: Reported on 11/18/2021), Disp: 40 tablet, Rfl: 0   DULoxetine (CYMBALTA) 60 MG capsule, Take 1 capsule (60 mg total) by mouth daily. (Patient not taking: Reported on 11/18/2021), Disp: 30 capsule, Rfl: 3   naloxone (NARCAN) nasal spray 4 mg/0.1 mL, Administer one spray in nostril for opioid overdose. If unresponsive after 1 dose, may repeat a dose every 2-3 minutes in alternating nostrils until emergency services arrive. (Patient not taking: Reported on 10/07/2021), Disp: 2 each, Rfl: 3   OLANZapine (ZYPREXA) 10 MG tablet, Take 1 tablet (10 mg total) by mouth at bedtime as needed (nausea). (Patient not taking: Reported on 11/18/2021), Disp: 30 tablet, Rfl: 2 No current facility-administered medications for this visit.  Facility-Administered Medications Ordered in Other Visits:    0.9 %  sodium  chloride infusion, , Intravenous, Continuous, Burns, Wandra Feinstein, NP, Stopped at 06/02/20 1453   heparin lock flush 100 unit/mL, 500 Units, Intravenous, Once, Sindy Guadeloupe, MD   heparin lock flush 100 unit/mL, 500 Units, Intravenous, Once, Sindy Guadeloupe, MD   heparin lock flush 100 unit/mL, 500 Units, Intracatheter, Once PRN, Sindy Guadeloupe, MD   sodium chloride flush (NS) 0.9 % injection 10 mL, 10 mL, Intravenous, Once, Sindy Guadeloupe, MD   sodium chloride flush (NS) 0.9 % injection 10 mL, 10 mL, Intravenous, Once, Sindy Guadeloupe, MD   trastuzumab-[facility]) 378 mg in sodium chloride 0.9 % 250 mL chemo infusion, 6 mg/kg (Treatment Plan Recorded), Intravenous, Once, Sindy Guadeloupe, MD, Last Rate: 536 mL/hr at 01/25/22 1209, 378 mg at 01/25/22 1209  Physical exam:  Vitals:   01/25/22 0918  BP: 114/80  Pulse: 92  Resp: 16  SpO2: 95%  Weight: 141 lb (64 kg)   Physical Exam Constitutional:      General: She is not in acute distress. Cardiovascular:     Rate and Rhythm: Normal rate and regular rhythm.     Heart sounds: Normal heart sounds.  Pulmonary:     Effort: Pulmonary effort is normal.  Skin:    General: Skin is warm and dry.  Neurological:     Mental Status: She is alert and oriented to person, place, and time.         Latest Ref Rng & Units 11/18/2021    8:53 AM  CMP  Glucose 70 - 99 mg/dL 156   BUN 6 - 20 mg/dL 10   Creatinine 0.44 - 1.00 mg/dL 0.59   Sodium 135 - 145 mmol/L 135   Potassium 3.5 - 5.1 mmol/L 3.2   Chloride 98 - 111 mmol/L 105   CO2 22 - 32 mmol/L 21   Calcium 8.9 - 10.3 mg/dL 8.3   Total Protein 6.5 - 8.1 g/dL 7.1   Total Bilirubin 0.3 - 1.2 mg/dL 0.3   Alkaline Phos 38 - 126 U/L 77   AST 15 - 41 U/L 44   ALT 0 -  44 U/L 24       Latest Ref Rng & Units 11/18/2021    8:53 AM  CBC  WBC 4.0 - 10.5 K/uL 4.9   Hemoglobin 12.0 - 15.0 g/dL 12.3   Hematocrit 36.0 - 46.0 % 37.3   Platelets 150 - 400 K/uL 318      Assessment and plan- Patient is  a 41 y.o. 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  She is here for on treatment assessment prior to next cycle of Herceptin  Counts okay to proceed with Herceptin today.  She restarted tucatinib a week ago.  I have asked her to continue the drug even while she was receiving radiation treatment but she did not.  Xeloda will be restarted today.  I will see her back in 3 weeks for next cycle of Herceptin.  Repeat scans next week.  I will plan to get a repeat MRI brain sometime in late December 2023 about 2 months after completing radiation treatment.   Visit Diagnosis 1. Primary malignant neoplasm of breast with metastasis ([place])   2. Multiple pathological fractures, initial encounter      Dr. Randa Evens, MD, MPH [facility] [phone] 01/25/2022 12:13 PM

## 2022-01-31 ENCOUNTER — Inpatient Hospital Stay: Payer: Self-pay

## 2022-01-31 ENCOUNTER — Encounter
Admission: RE | Admit: 2022-01-31 | Discharge: 2022-01-31 | Disposition: A | Discharge status: Home / Self Care | Payer: Self-pay | Source: Ambulatory Visit | Attending: Oncology | Admitting: Oncology

## 2022-01-31 ENCOUNTER — Ambulatory Visit
Admission: RE | Admit: 2022-01-31 | Discharge: 2022-01-31 | Disposition: A | Discharge status: Home / Self Care | Payer: Self-pay | Source: Ambulatory Visit | Attending: Oncology | Admitting: Oncology

## 2022-01-31 DIAGNOSIS — C50919 Malignant neoplasm of unspecified site of unspecified female breast: Secondary | ICD-10-CM

## 2022-01-31 LAB — POCT I-STAT CREATININE: Creatinine, Ser: 0.8 mg/dL (ref 0.44–1.00)

## 2022-01-31 MED ORDER — TECHNETIUM TC 99M MEDRONATE IV KIT
20.0000 | PACK | Freq: Once | INTRAVENOUS | Status: AC | PRN
  Administered 2022-01-31: 21.05 via INTRAVENOUS

## 2022-01-31 MED ORDER — IOHEXOL 300 MG/ML  SOLN
100.0000 mL | Freq: Once | INTRAMUSCULAR | Status: AC | PRN
  Administered 2022-01-31: 100 mL via INTRAVENOUS

## 2022-02-08 ENCOUNTER — Other Ambulatory Visit: Payer: Self-pay | Admitting: Nurse Practitioner

## 2022-02-08 ENCOUNTER — Ambulatory Visit
Admission: RE | Admit: 2022-02-08 | Discharge: 2022-02-08 | Disposition: A | Discharge status: Home / Self Care | Payer: Self-pay | Source: Ambulatory Visit | Attending: Oncology | Admitting: Oncology

## 2022-02-08 ENCOUNTER — Other Ambulatory Visit: Payer: Self-pay

## 2022-02-08 ENCOUNTER — Encounter: Payer: Self-pay | Admitting: Oncology

## 2022-02-08 ENCOUNTER — Other Ambulatory Visit: Payer: Self-pay | Admitting: Oncology

## 2022-02-08 DIAGNOSIS — F419 Anxiety disorder, unspecified: Secondary | ICD-10-CM | POA: Insufficient documentation

## 2022-02-08 DIAGNOSIS — C50919 Malignant neoplasm of unspecified site of unspecified female breast: Secondary | ICD-10-CM

## 2022-02-08 DIAGNOSIS — Z0189 Encounter for other specified special examinations: Secondary | ICD-10-CM

## 2022-02-08 LAB — ECHOCARDIOGRAM COMPLETE
AR max vel: 2.35 cm2
AV Area VTI: 3.05 cm2
AV Area mean vel: 2.75 cm2
AV Mean grad: 3 mmHg
AV Peak grad: 5 mmHg
Ao pk vel: 1.12 m/s
Area-P 1/2: 3.91 cm2
S' Lateral: 2 cm

## 2022-02-08 MED FILL — Morphine Sulfate Tab ER 15 MG: ORAL | 30 days supply | Qty: 90 | Fill #0 | Status: AC

## 2022-02-08 NOTE — Progress Notes (Signed)
*  PRELIMINARY RESULTS* Echocardiogram 2D Echocardiogram has been performed.  Sophia [name] 02/08/2022, 11:08 AM

## 2022-02-13 ENCOUNTER — Other Ambulatory Visit: Payer: Self-pay

## 2022-02-15 ENCOUNTER — Other Ambulatory Visit: Payer: Self-pay | Admitting: Oncology

## 2022-02-15 ENCOUNTER — Emergency Department: Payer: Self-pay

## 2022-02-15 ENCOUNTER — Inpatient Hospital Stay: Payer: Self-pay | Admitting: Oncology

## 2022-02-15 ENCOUNTER — Telehealth: Payer: Self-pay | Admitting: *Deleted

## 2022-02-15 ENCOUNTER — Other Ambulatory Visit: Payer: Self-pay

## 2022-02-15 ENCOUNTER — Emergency Department
Admission: EM | Admit: 2022-02-15 | Discharge: 2022-02-15 | Disposition: A | Discharge status: Home / Self Care | Payer: Self-pay | Attending: Emergency Medicine | Admitting: Emergency Medicine

## 2022-02-15 ENCOUNTER — Inpatient Hospital Stay: Payer: Self-pay

## 2022-02-15 ENCOUNTER — Encounter: Payer: Self-pay | Admitting: *Deleted

## 2022-02-15 DIAGNOSIS — R519 Headache, unspecified: Secondary | ICD-10-CM | POA: Insufficient documentation

## 2022-02-15 DIAGNOSIS — Z85841 Personal history of malignant neoplasm of brain: Secondary | ICD-10-CM | POA: Insufficient documentation

## 2022-02-15 DIAGNOSIS — H538 Other visual disturbances: Secondary | ICD-10-CM | POA: Insufficient documentation

## 2022-02-15 DIAGNOSIS — Z853 Personal history of malignant neoplasm of breast: Secondary | ICD-10-CM | POA: Insufficient documentation

## 2022-02-15 DIAGNOSIS — R42 Dizziness and giddiness: Secondary | ICD-10-CM | POA: Insufficient documentation

## 2022-02-15 LAB — CBC WITH DIFFERENTIAL/PLATELET
Abs Immature Granulocytes: 0.01 10*3/uL (ref 0.00–0.07)
Basophils Absolute: 0 10*3/uL (ref 0.0–0.1)
Basophils Relative: 1 %
Eosinophils Absolute: 0.1 10*3/uL (ref 0.0–0.5)
Eosinophils Relative: 1 %
HCT: 38.2 % (ref 36.0–46.0)
Hemoglobin: 13 g/dL (ref 12.0–15.0)
Immature Granulocytes: 0 %
Lymphocytes Relative: 27 %
Lymphs Abs: 1.2 10*3/uL (ref 0.7–4.0)
MCH: 31 pg (ref 26.0–34.0)
MCHC: 34 g/dL (ref 30.0–36.0)
MCV: 91 fL (ref 80.0–100.0)
Monocytes Absolute: 0.5 10*3/uL (ref 0.1–1.0)
Monocytes Relative: 11 %
Neutro Abs: 2.6 10*3/uL (ref 1.7–7.7)
Neutrophils Relative %: 60 %
Platelets: 297 10*3/uL (ref 150–400)
RBC: 4.2 MIL/uL (ref 3.87–5.11)
RDW: 14.7 % (ref 11.5–15.5)
WBC: 4.4 10*3/uL (ref 4.0–10.5)
nRBC: 0 % (ref 0.0–0.2)

## 2022-02-15 LAB — COMPREHENSIVE METABOLIC PANEL
ALT: 31 U/L (ref 0–44)
AST: 34 U/L (ref 15–41)
Albumin: 4.4 g/dL (ref 3.5–5.0)
Alkaline Phosphatase: 67 U/L (ref 38–126)
Anion gap: 7 (ref 5–15)
BUN: 8 mg/dL (ref 6–20)
CO2: 24 mmol/L (ref 22–32)
Calcium: 9.6 mg/dL (ref 8.9–10.3)
Chloride: 108 mmol/L (ref 98–111)
Creatinine, Ser: 0.58 mg/dL (ref 0.44–1.00)
GFR, Estimated: >60 mL/min (ref >60)
Glucose, Bld: 107 mg/dL — ABNORMAL HIGH (ref 70–99)
Potassium: 3.4 mmol/L — ABNORMAL LOW (ref 3.5–5.1)
Sodium: 139 mmol/L (ref 135–145)
Total Bilirubin: 0.5 mg/dL (ref 0.3–1.2)
Total Protein: 7.8 g/dL (ref 6.5–8.1)

## 2022-02-15 MED ORDER — MORPHINE SULFATE (PF) 4 MG/ML IV SOLN
4.0000 mg | Freq: Once | INTRAVENOUS | Status: AC
  Administered 2022-02-15: 4 mg via INTRAMUSCULAR
  Filled 2022-02-15: qty 1

## 2022-02-15 MED ORDER — DEXAMETHASONE SODIUM PHOSPHATE 10 MG/ML IJ SOLN
10.0000 mg | Freq: Once | INTRAMUSCULAR | Status: AC
  Administered 2022-02-15: 10 mg via INTRAMUSCULAR
  Filled 2022-02-15: qty 1

## 2022-02-15 MED ORDER — DEXAMETHASONE 4 MG PO TABS
8.0000 mg | ORAL_TABLET | Freq: Two times a day (BID) | ORAL | 0 refills | Status: DC
  Filled 2022-02-15: qty 20, 5d supply, fill #0
  Filled 2022-02-16: qty 100, 25d supply, fill #0

## 2022-02-15 NOTE — Telephone Encounter (Signed)
I spoke to Sophia [name] about the pt symptoms and she states that with her brain mets she should go to ER. Maritza send her a message about this and she has to get baby for someone to watch and then the other 2 kids to take home and then she will go to ER.

## 2022-02-15 NOTE — ED Triage Notes (Addendum)
Pt has breast cancer.  Pt taking chemo pills and radiation.  Pt reports a headache, blurred vision.  Pt has n/v  pt alert   speech clear. Pt has a port.

## 2022-02-15 NOTE — ED Provider Notes (Signed)
[facility] Provider Note    Event Date/Time   First MD Initiated Contact with Patient 02/15/22 2044     (approximate)  History   Chief Complaint: Headache, dizziness  HPI  Macel Yearsley is a 41 y.o. female with a past medical history of metastatic breast cancer with brain metastases, presents to the emergency department for headache blurred vision and intermittent dizziness.  Patient states the headache visual changes and intermittent dizziness has been ongoing for the past week.  Patient has a more chronic history of nausea and vomiting related to her oral chemotherapy, no significant change in that aspect per patient.  No new weakness or numbness.  Physical Exam   Triage Vital Signs: ED Triage Vitals  Enc Vitals Group     BP 02/15/22 1816 116/79     Pulse Rate 02/15/22 1816 76     Resp 02/15/22 1816 18     Temp 02/15/22 1816 98.3 F (36.8 C)     Temp Source 02/15/22 1816 Oral     SpO2 02/15/22 1816 98 %     Weight 02/15/22 1816 141 lb 1.5 oz (64 kg)     Height 02/15/22 1816 '5\' 2"'$  (1.575 m)     Head Circumference --      Peak Flow --      Pain Score 02/15/22 1815 9     Pain Loc --      Pain Edu? --      Excl. in [place]? --     Most recent vital signs: Vitals:   02/15/22 1816  BP: 116/79  Pulse: 76  Resp: 18  Temp: 98.3 F (36.8 C)  SpO2: 98%    General: Awake, no distress.  CV:  Good peripheral perfusion.  Regular rate and rhythm  Resp:  Normal effort.  Equal breath sounds bilaterally.  Abd:  No distention.  Soft, slight epigastric tenderness.  No rebound or guarding.   ED Results / Procedures / Treatments   RADIOLOGY  I have reviewed and interpreted the CT images I do not see any obvious significant abnormality or bleed on my evaluation. Radiology has read multiple metastases that are better appreciated on the MRI from September does show areas of increasing vasogenic edema no mass effect or midline shift.  No  bleed.  MEDICATIONS ORDERED IN ED: Medications  morphine (PF) 4 MG/ML injection 4 mg (4 mg Intramuscular Given 02/15/22 2132)  dexamethasone (DECADRON) injection 10 mg (10 mg Intramuscular Given 02/15/22 2132)     IMPRESSION / MDM / ASSESSMENT AND PLAN / ED COURSE  I reviewed the triage vital signs and the nursing notes.  Patient's presentation is most consistent with acute presentation with potential threat to life or bodily function.  Patient presents emergency department with 1 week of intermittent dizziness headaches and blurred vision.  Patient's workup in the emergency department is overall reassuring, lab work shows no concerning abnormality with a normal CBC, normal chemistry.  CT scan does not appear to show any significant abnormality or change from her prior MRI at least.  I spoke with Dr.Yu who is Dr.Rao's partner.  She believes her symptoms are likely related to vasogenic edema.  Patient is no longer taking Decadron, stopped at the beginning of November.  Patient still receives radiation treatment as well as oral chemotherapy.  Given the CT scan showing increased vasogenic edema oncology has recommended that we start the patient on 8 mg of Decadron twice daily and they will follow-up with  the patient in the office.  I discussed this plan of care with the patient she is agreeable.  While the patient is in the emergency department we will dose a shot of pain medication as well as an IM injection of Decadron.  Patient will pick up her prescription and begin oral Decadron tomorrow.  Patient is agreeable to this plan of care, given the outpatient follow-up and trial of new medication I believe the patient is safe for discharge home.  FINAL CLINICAL IMPRESSION(S) / ED DIAGNOSES   Headache Dizziness Blurred vision  Rx / DC Orders   Decadron 8 mg twice daily  Note:  This document was prepared using Dragon voice recognition software and may include unintentional dictation errors.    Harvest Dark, MD 02/15/22 2142

## 2022-02-15 NOTE — Discharge Instructions (Addendum)
Please follow-up with your oncologist as soon as possible.  Please begin taking your prescribed Decadron tomorrow.  You will be taking 2 pills 2 times a day.  Return to the emergency department for any symptom personally concerning to yourself.

## 2022-02-16 ENCOUNTER — Other Ambulatory Visit: Payer: Self-pay | Admitting: *Deleted

## 2022-02-16 ENCOUNTER — Telehealth: Payer: Self-pay | Admitting: *Deleted

## 2022-02-16 ENCOUNTER — Encounter: Payer: Self-pay | Admitting: Oncology

## 2022-02-16 ENCOUNTER — Other Ambulatory Visit: Payer: Self-pay | Admitting: Oncology

## 2022-02-16 ENCOUNTER — Ambulatory Visit
Admission: RE | Admit: 2022-02-16 | Discharge: 2022-02-16 | Disposition: A | Discharge status: Home / Self Care | Payer: Self-pay | Source: Ambulatory Visit | Attending: Radiation Oncology | Admitting: Radiation Oncology

## 2022-02-16 ENCOUNTER — Other Ambulatory Visit: Payer: Self-pay

## 2022-02-16 ENCOUNTER — Encounter: Payer: Self-pay | Admitting: Radiation Oncology

## 2022-02-16 VITALS — BP 99/68 | HR 74 | Temp 98.4°F | Resp 16 | Wt 137.6 lb

## 2022-02-16 DIAGNOSIS — C50912 Malignant neoplasm of unspecified site of left female breast: Secondary | ICD-10-CM

## 2022-02-16 DIAGNOSIS — R42 Dizziness and giddiness: Secondary | ICD-10-CM

## 2022-02-16 DIAGNOSIS — C7931 Secondary malignant neoplasm of brain: Secondary | ICD-10-CM | POA: Insufficient documentation

## 2022-02-16 DIAGNOSIS — C7951 Secondary malignant neoplasm of bone: Secondary | ICD-10-CM | POA: Insufficient documentation

## 2022-02-16 DIAGNOSIS — C50919 Malignant neoplasm of unspecified site of unspecified female breast: Secondary | ICD-10-CM

## 2022-02-16 DIAGNOSIS — C50911 Malignant neoplasm of unspecified site of right female breast: Secondary | ICD-10-CM

## 2022-02-16 DIAGNOSIS — M8440XA Pathological fracture, unspecified site, initial encounter for fracture: Secondary | ICD-10-CM

## 2022-02-16 DIAGNOSIS — Z171 Estrogen receptor negative status [ER-]: Secondary | ICD-10-CM | POA: Insufficient documentation

## 2022-02-16 DIAGNOSIS — Z923 Personal history of irradiation: Secondary | ICD-10-CM | POA: Insufficient documentation

## 2022-02-16 DIAGNOSIS — R519 Headache, unspecified: Secondary | ICD-10-CM

## 2022-02-16 NOTE — Progress Notes (Signed)
Radiation Oncology Follow up Note  Name: Sophia [name]   Date:   02/16/2022 MRN:  [id number] DOB: [birth year]    This 41 y.o. female presents to the clinic today for 1 month follow-up status post salvage radiation therapy to her whole brain and patient with known stage IV breast cancer.  REFERRING PROVIDER: Sindy Guadeloupe, MD  HPI: Patient is a 41 year old female now at 1 month having completed whole brain radiation therapy as salvage and patient previously treated with whole brain radiation therapy for stage IV breast cancer.  Her major complaint today is some tenderness around her eyes.  She was also seen in the emergency room yesterday for blurring vision and some headaches was put on Decadron although she has not filled that prescription yet..  She had a bone scan 14 November showing new focus of abnormal radiotracer uptake in the distal right femur.  She also has multiple bone mets.  CT scan yesterday in the ER showed multiple cortical metastasis better appreciated on prior MRI scan are not well-visualized on this exam indicative of response.  No significant mass effect or midline shift.  COMPLICATIONS OF TREATMENT: none  FOLLOW UP COMPLIANCE: keeps appointments   PHYSICAL EXAM:  BP 99/68 (BP Location: Left Arm, Patient Position: Sitting, Cuff Size: Normal)   Pulse 74   Temp 98.4 F (36.9 C) (Tympanic)   Resp 16   Wt 137 lb 9.6 oz (62.4 kg)   BMI 25.17 kg/m  No change in neurologic exam.  Some hyperpigmentation of the skin of the scalp.  Well-developed well-nourished patient in NAD. HEENT reveals PERLA, EOMI, discs not visualized.  Oral cavity is clear. No oral mucosal lesions are identified. Neck is clear without evidence of cervical or supraclavicular adenopathy. Lungs are clear to A&P. Cardiac examination is essentially unremarkable with regular rate and rhythm without murmur rub or thrill. Abdomen is benign with no organomegaly or masses noted. Motor sensory and DTR levels  are equal and symmetric in the upper and lower extremities. Cranial nerves II through XII are grossly intact. Proprioception is intact. No peripheral adenopathy or edema is identified. No motor or sensory levels are noted. Crude visual fields are within normal range.  RADIOLOGY RESULTS: CT scan of head reviewed compatible with above-stated findings  PLAN: At this time I will turn follow-up care over to medical oncology.  Patient needs systemic treatment.  I am not inclined to treat any bone mets at this time since they are asymptomatic.  Would reserve radiation for palliation if that is indicated.  Patient comprehends my recommendations well.  I would like to take this opportunity to thank you for allowing me to participate in the care of your patient.Noreene Filbert, MD

## 2022-02-16 NOTE — Telephone Encounter (Signed)
I have called Sophia [name] and she has called me and we are missing each other calls. Dr. Janese Banks saw the info about ER and pt eeds MRI of brain and [facility] has [facility] I left another message about maritza could tell pt about this and once we get results we will let pt know

## 2022-02-17 ENCOUNTER — Other Ambulatory Visit: Payer: Self-pay

## 2022-02-17 ENCOUNTER — Telehealth: Payer: Self-pay | Admitting: *Deleted

## 2022-02-17 NOTE — Telephone Encounter (Signed)
Pt was told to go and get MRI brain on Sunday 12/3 for the scan. Pt knows that already. I sent her a message through what's app because that is what she can see. And let her know that she has appt for rao 12/5 at 10 am.

## 2022-02-17 NOTE — Telephone Encounter (Signed)
Patient sent me a message that she got my message and she will be here on 12/5

## 2022-02-19 ENCOUNTER — Ambulatory Visit
Admission: RE | Admit: 2022-02-19 | Discharge: 2022-02-19 | Disposition: A | Discharge status: Home / Self Care | Payer: Self-pay | Source: Ambulatory Visit | Attending: Oncology | Admitting: Oncology

## 2022-02-19 DIAGNOSIS — C50911 Malignant neoplasm of unspecified site of right female breast: Secondary | ICD-10-CM | POA: Insufficient documentation

## 2022-02-19 DIAGNOSIS — R42 Dizziness and giddiness: Secondary | ICD-10-CM | POA: Insufficient documentation

## 2022-02-19 DIAGNOSIS — C7931 Secondary malignant neoplasm of brain: Secondary | ICD-10-CM | POA: Insufficient documentation

## 2022-02-19 DIAGNOSIS — R519 Headache, unspecified: Secondary | ICD-10-CM | POA: Insufficient documentation

## 2022-02-19 MED ORDER — GADOBUTROL 1 MMOL/ML IV SOLN
6.0000 mL | Freq: Once | INTRAVENOUS | Status: AC | PRN
  Administered 2022-02-19: 7.5 mL via INTRAVENOUS

## 2022-02-21 ENCOUNTER — Inpatient Hospital Stay: Payer: Self-pay | Attending: Oncology | Admitting: Oncology

## 2022-02-21 ENCOUNTER — Telehealth: Payer: Self-pay | Admitting: Pharmacist

## 2022-02-21 ENCOUNTER — Other Ambulatory Visit (HOSPITAL_COMMUNITY): Payer: Self-pay

## 2022-02-21 ENCOUNTER — Other Ambulatory Visit: Payer: Self-pay

## 2022-02-21 ENCOUNTER — Other Ambulatory Visit: Payer: Self-pay | Admitting: Pharmacist

## 2022-02-21 VITALS — BP 110/72 | HR 69 | Temp 98.4°F | Resp 18 | Wt 138.8 lb

## 2022-02-21 DIAGNOSIS — Z5112 Encounter for antineoplastic immunotherapy: Secondary | ICD-10-CM | POA: Insufficient documentation

## 2022-02-21 DIAGNOSIS — M549 Dorsalgia, unspecified: Secondary | ICD-10-CM | POA: Insufficient documentation

## 2022-02-21 DIAGNOSIS — M8448XA Pathological fracture, other site, initial encounter for fracture: Secondary | ICD-10-CM | POA: Insufficient documentation

## 2022-02-21 DIAGNOSIS — M8440XA Pathological fracture, unspecified site, initial encounter for fracture: Secondary | ICD-10-CM

## 2022-02-21 DIAGNOSIS — C7931 Secondary malignant neoplasm of brain: Secondary | ICD-10-CM | POA: Insufficient documentation

## 2022-02-21 DIAGNOSIS — C7951 Secondary malignant neoplasm of bone: Secondary | ICD-10-CM | POA: Insufficient documentation

## 2022-02-21 DIAGNOSIS — C50412 Malignant neoplasm of upper-outer quadrant of left female breast: Secondary | ICD-10-CM | POA: Insufficient documentation

## 2022-02-21 DIAGNOSIS — J029 Acute pharyngitis, unspecified: Secondary | ICD-10-CM | POA: Insufficient documentation

## 2022-02-21 DIAGNOSIS — Z171 Estrogen receptor negative status [ER-]: Secondary | ICD-10-CM | POA: Insufficient documentation

## 2022-02-21 DIAGNOSIS — Z800 Family history of malignant neoplasm of digestive organs: Secondary | ICD-10-CM | POA: Insufficient documentation

## 2022-02-21 DIAGNOSIS — C50919 Malignant neoplasm of unspecified site of unspecified female breast: Secondary | ICD-10-CM

## 2022-02-21 MED ORDER — TUCATINIB 150 MG PO TABS: 300.0000 mg | ORAL_TABLET | Freq: Two times a day (BID) | ORAL | 3 refills | Status: DC

## 2022-02-21 MED ORDER — CAPECITABINE 500 MG PO TABS
1500.0000 mg | ORAL_TABLET | Freq: Two times a day (BID) | ORAL | 1 refills | Status: DC
  Filled 2022-02-21 – 2022-02-22 (×2): qty 84, 14d supply, fill #0
  Filled 2022-03-15: qty 84, 14d supply, fill #1

## 2022-02-21 NOTE — Progress Notes (Signed)
Pt reports pain on her rt hip as well as mouth sores that have appeared earlier this week states the ones inside her mouth are gone but some have appeared around her lips. Pt states she has not receieved her TUKYSA and is not sure if it is due to her address change.

## 2022-02-21 NOTE — Telephone Encounter (Signed)
Per MD, patient is restarting her capecitabine and tucatinib. Patient does not currently have stable housing. Patient to restarted capecitabine on 02/28/22. She reports have some   Capecitabine: Her capecitabine is being filled by [facility]), Suezanne Jacquet (patient advocate) coordination for med to be filled and couriered to [facility]. Med will be given to her at 02/28/22 office visit.  Tucatinib:  Her tucatinib is filled through [facility] patient assistance program. Contacted [facility] to coordinate delivery to the office for her tucatinib. [employer] will deliver medication on 02/22/22. I will be the primary contact for future refill calls.

## 2022-02-22 ENCOUNTER — Encounter: Payer: Self-pay | Admitting: Oncology

## 2022-02-22 ENCOUNTER — Other Ambulatory Visit: Payer: Self-pay | Admitting: *Deleted

## 2022-02-22 ENCOUNTER — Other Ambulatory Visit (HOSPITAL_COMMUNITY): Payer: Self-pay

## 2022-02-23 ENCOUNTER — Other Ambulatory Visit (HOSPITAL_COMMUNITY): Payer: Self-pay

## 2022-02-24 ENCOUNTER — Other Ambulatory Visit: Payer: Self-pay

## 2022-02-24 ENCOUNTER — Other Ambulatory Visit (HOSPITAL_COMMUNITY): Payer: Self-pay

## 2022-02-24 ENCOUNTER — Other Ambulatory Visit: Payer: Self-pay | Admitting: *Deleted

## 2022-02-24 ENCOUNTER — Encounter: Payer: Self-pay | Admitting: Oncology

## 2022-02-24 MED ORDER — MUPIROCIN 2 % EX OINT
1.0000 | TOPICAL_OINTMENT | Freq: Two times a day (BID) | CUTANEOUS | 0 refills | Status: DC
  Filled 2022-02-24: qty 22, 11d supply, fill #0

## 2022-02-25 ENCOUNTER — Encounter: Payer: Self-pay | Admitting: Oncology

## 2022-02-25 NOTE — Progress Notes (Signed)
Hematology/Oncology Consult note [facility]  Telephone:([phone] [phone]  Patient Care Team: Sindy Guadeloupe, MD as PCP - General (Oncology) Rico Junker, RN as Registered Nurse Theodore Demark, RN (Inactive) as Registered Nurse Sindy Guadeloupe, MD as Consulting Physician (Hematology and Oncology)   Name of the patient: Sophia [name]  [id number]  Nov 01, 1980   Date of visit: 02/25/22  Diagnosis- stage IV metastatic breast cancer ER/PR negative HER-2/neu positive with bone metastases    Chief complaint/ Reason for visit- discuss mri brain results and further management  Heme/Onc history: patient is a 41 year old Hispanic female who is here with her friend.  History obtained with the help of an interpreter.Patient self palpated left breast mass which was followed by a diagnostic bilateral mammogram.  Mammogram showed 3.1 x 2.9 x 1.9 cm hypoechoic mass at the 1 o'clock position of the left breast.  For abnormal cortically thickened left axillary lymph nodes measuring up to 5 mm.  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  Patient was also having ongoing back pain and was seen by [facility] orthopedics Dr. Doyle Askew who ordered MRI lumbar spine without contrast which showed possible pathologic fractures of L1 and L4 vertebral bodies with greater than 50% height loss at L1 and abnormal signal involving L2-L3 S1 as well as right iliac bone concerning for metastatic disease.  Patient is a single mother of 3 adult children and is very anxious today.  She reports significant back pain which radiates to her bilateral thighs.  Denies any focal tingling numbness or weakness.  Denies any bowel bladder incontinence.  Pain has been uncontrolled despite taking Tylenol.  No prior history of abnormal breast biopsies.  No family history of breast cancer    PET and MRI showed 3 areas of pathologic fracture of her spine as well as widespread bony metastatic disease and concern for impending fracture of the right hip.  Given her worsening pain she was asked to come to the ER.  She has been evaluated by Dr. Rudene Christians from orthopedic surgery and underwent kyphoplasty at 3 different levels. T6 L1 and L4 along with radiofrequency ablation.    She also underwent prophylactic fixation of the right hip and not affected the sacral region.     Patient received first dose of Herceptin and Perjeta on 09/04/2019.  Patient initially received Taxol and Herceptin perjeta.  Presently patient is on maintenance Herceptin and Perjeta alone.  Patient found to have brain mets in November 2022 for which she received whole brain radiation treatment but there was no evidence of systemic progression.  She was continued on Herceptin and Perjeta.  Repeat MRI in June 2023 showed slight progression of brain mets and plan is to switch her to Herceptin tucatinib Xeloda  Interval history-patient reports symptoms of headache have improved.  Denies any significant nausea or vomiting.  Reports that there is a mild sore in her lip which has been bothering her.  ECOG PS- 1 Pain scale- 2 Opioid associated constipation- no  Review of systems- Review of Systems  Constitutional:  Negative for chills, fever, malaise/fatigue and weight loss.  HENT:  Negative for congestion, ear discharge and nosebleeds.        Mouth sore  Eyes:  Negative for blurred vision.  Respiratory:  Negative for cough, hemoptysis, sputum production, shortness of breath and wheezing.  Cardiovascular:  Negative for chest pain, palpitations, orthopnea and claudication.  Gastrointestinal:  Negative for abdominal pain, blood in stool, constipation, diarrhea, heartburn, melena, nausea and vomiting.  Genitourinary:  Negative for dysuria, flank pain, frequency, hematuria and urgency.  Musculoskeletal:  Negative for back pain, joint  pain and myalgias.  Skin:  Negative for rash.  Neurological:  Negative for dizziness, tingling, focal weakness, seizures, weakness and headaches.  Endo/Heme/Allergies:  Does not bruise/bleed easily.  Psychiatric/Behavioral:  Negative for depression and suicidal ideas. The patient does not have insomnia.       No Known Allergies   Past Medical History:  Diagnosis Date   Anxiety    Breast cancer ([place])    with mets   Cancer ([place])    Colitis    COVID-19 in immunocompromised patient ([place])    Family history of colon cancer    Vertigo      Past Surgical History:  Procedure Laterality Date   BREAST BIOPSY Left 08/14/2019   [place] bx of mass, coil marker, path pending   BREAST BIOPSY Left 08/14/2019   [place] bx of LN, hydromarker, path pending   BREAST BIOPSY Left 08/14/2019   affirm bx of calcs, x marker, path pending   ESOPHAGOGASTRODUODENOSCOPY (EGD) WITH PROPOFOL N/A 10/05/2019   Procedure: ESOPHAGOGASTRODUODENOSCOPY (EGD) WITH PROPOFOL;  Surgeon: Lin Landsman, MD;  Location: [place];  Service: Gastroenterology;  Laterality: N/A;   FLEXIBLE SIGMOIDOSCOPY N/A 10/05/2019   Procedure: FLEXIBLE SIGMOIDOSCOPY;  Surgeon: Lin Landsman, MD;  Location: [facility] ENDOSCOPY;  Service: Gastroenterology;  Laterality: N/A;   INTRAMEDULLARY (IM) NAIL INTERTROCHANTERIC Right 09/01/2019   Procedure: INTRAMEDULLARY (IM) NAIL INTERTROCHANTRIC AND RADIOFREQUENCY ABLATION;  Surgeon: Hessie Knows, MD;  Location: [facility] ORS;  Service: Orthopedics;  Laterality: Right;   KYPHOPLASTY N/A 08/29/2019   Procedure: KYPHOPLASTY T6, L1,L4 ,  RADIOFREQUENCY ABLATION;  Surgeon: Hessie Knows, MD;  Location: [facility] ORS;  Service: Orthopedics;  Laterality: N/A;   KYPHOPLASTY Right 09/01/2019   Procedure: Right Sacral Radiofrequency Ablation and Cement Augmentation, Right sacrum and iliac crest;  Surgeon: Hessie Knows, MD;  Location: [facility] ORS;  Service: Orthopedics;  Laterality: Right;   PORTA CATH INSERTION N/A  08/28/2019   Procedure: PORTA CATH INSERTION;  Surgeon: Algernon Huxley, MD;  Location: [place] CV LAB;  Service: Cardiovascular;  Laterality: N/A;    Social History   Socioeconomic History   Marital status: Single    Spouse name: Not on file   Number of children: Not on file   Years of education: Not on file   Highest education level: Not on file  Occupational History   Not on file  Tobacco Use   Smoking status: Never   Smokeless tobacco: Never  Vaping Use   Vaping Use: Never used  Substance and Sexual Activity   Alcohol use: Not Currently   Drug use: Not Currently   Sexual activity: Not Currently    Birth control/protection: None  Other Topics Concern   Not on file  Social History Narrative   Lives at home with children   Social Determinants of Health   Financial Resource Strain: High Risk (11/18/2021)   Overall Financial Resource Strain (CARDIA)    Difficulty of Paying Living Expenses: Very hard  Food Insecurity: Food Insecurity Present (12/26/2021)   Hunger Vital Sign    Worried About Running Out of Food in the Last Year: Often true    Ran Out of Food in the Last Year: Often true  Transportation Needs: Unmet Transportation Needs (  12/26/2021)   PRAPARE - Transportation    Lack of Transportation (Medical): Yes    Lack of Transportation (Non-Medical): Yes  Physical Activity: Inactive (11/18/2021)   Exercise Vital Sign    Days of Exercise per Week: 0 days    Minutes of Exercise per Session: 0 min  Stress: Stress Concern Present (11/18/2021)   [place]    Feeling of Stress : Very much  Social Connections: Socially Isolated (11/18/2021)   Social Connection and Isolation Panel [NHANES]    Frequency of Communication with Friends and Family: Three times a week    Frequency of Social Gatherings with Friends and Family: Three times a week    Attends Religious Services: Never    Active Member of Clubs or  Organizations: No    Attends Archivist Meetings: Never    Marital Status: Never married  Intimate Partner Violence: Not At Risk (11/18/2021)   Humiliation, Afraid, Rape, and Kick questionnaire    Fear of Current or Ex-Partner: No    Emotionally Abused: No    Physically Abused: No    Sexually Abused: No    Family History  Problem Relation Age of Onset   Colon cancer Maternal Uncle      Current Outpatient Medications:    butalbital-acetaminophen-caffeine (FIORICET) 50-325-40 MG tablet, Take 1-2 tablets by mouth every 8 (eight) hours as needed for headache., Disp: 30 tablet, Rfl: 0   Calcium Carb-Cholecalciferol 600-10 MG-MCG TABS, Take 1 tablet by mouth 2 (two) times daily., Disp: 180 tablet, Rfl: 0   dexamethasone (DECADRON) 4 MG tablet, Take 2 tablets (8 mg total) by mouth 2 (two) times daily., Disp: 120 tablet, Rfl: 0   diphenoxylate-atropine (LOMOTIL) 2.5-0.025 MG tablet, Take 1 tablet by mouth 4 (four) times daily as needed for diarrhea or loose stools., Disp: 40 tablet, Rfl: 0   DULoxetine (CYMBALTA) 60 MG capsule, Take 1 capsule (60 mg total) by mouth daily., Disp: 30 capsule, Rfl: 3   gabapentin (NEURONTIN) 600 MG tablet, Take 1 tablet (600 mg total) by mouth 3 (three) times daily., Disp: 90 tablet, Rfl: 3   lidocaine-prilocaine (EMLA) cream, Apply 1 application topically as needed. Apply small amount to port site at least 1 hour prior to it being accessed, cover with plastic wrap, Disp: 30 g, Rfl: 3   LORazepam (ATIVAN) 0.5 MG tablet, Take 1 tablet (0.5 mg total) by mouth every 6 (six) hours as needed for anxiety (and nausea). AS NEEDED FOR NAUSEA, Disp: 45 tablet, Rfl: 0   morphine (MS CONTIN) 15 MG 12 hr tablet, Take 1 tablet (15 mg total) by mouth every 8 (eight) hours., Disp: 90 tablet, Rfl: 0   naloxone (NARCAN) nasal spray 4 mg/0.1 mL, Administer one spray in nostril for opioid overdose. If unresponsive after 1 dose, may repeat a dose every 2-3 minutes in alternating  nostrils until emergency services arrive., Disp: 2 each, Rfl: 3   OLANZapine (ZYPREXA) 10 MG tablet, Take 1 tablet (10 mg total) by mouth at bedtime as needed (nausea)., Disp: 30 tablet, Rfl: 2   omeprazole (PRILOSEC) 20 MG capsule, Take 1 capsule (20 mg total) by mouth daily., Disp: 30 capsule, Rfl: 3   ondansetron (ZOFRAN) 8 MG tablet, Take 1 tablet (8 mg total) by mouth every 8 (eight) hours as needed for nausea or vomiting., Disp: 45 tablet, Rfl: 2   Oxycodone HCl 10 MG TABS, Take 1/2 to 1 tablet (5-10 mg total) by mouth every 4 (  four) hours as needed., Disp: 45 tablet, Rfl: 0   polyethylene glycol powder (MIRALAX) 17 GM/SCOOP powder, Take 17 g by mouth daily as needed., Disp: 238 g, Rfl: 3   potassium chloride SA (KLOR-CON M) 20 MEQ tablet, Take 1 tablet (20 mEq total) by mouth daily., Disp: 30 tablet, Rfl: 0   albuterol (VENTOLIN HFA) 108 (90 Base) MCG/ACT inhaler, Inhale 2 puffs into the lungs every 6 (six) hours as needed for wheezing or shortness of breath. (Patient not taking: Reported on 11/18/2021), Disp: 8.5 g, Rfl: 2   benzoyl peroxide-erythromycin (BENZAMYCIN) gel, Apply topically 2 (two) times daily. (Patient not taking: Reported on 09/09/2021), Disp: 46.6 g, Rfl: 0   capecitabine (XELODA) 500 MG tablet, Take 3 tablets (1,500 mg total) by mouth 2 (two) times daily after a meal. Take for 14 days, then hold for 7 days. Repeat every 21 days., Disp: 84 tablet, Rfl: 1   mupirocin ointment (BACTROBAN) 2 %, Apply 1 Application topically 2 (two) times daily., Disp: 22 g, Rfl: 0   tucatinib (TUKYSA) 150 MG tablet, Take 2 tablets (300 mg total) by mouth 2 (two) times daily., Disp: 120 tablet, Rfl: 3 No current facility-administered medications for this visit.  Facility-Administered Medications Ordered in Other Visits:    0.9 %  sodium chloride infusion, , Intravenous, Continuous, Burns, Wandra Feinstein, NP, Stopped at 06/02/20 1453   heparin lock flush 100 unit/mL, 500 Units, Intravenous, Once, Sindy Guadeloupe, MD   heparin lock flush 100 unit/mL, 500 Units, Intravenous, Once, Sindy Guadeloupe, MD   sodium chloride flush (NS) 0.9 % injection 10 mL, 10 mL, Intravenous, Once, Sindy Guadeloupe, MD   sodium chloride flush (NS) 0.9 % injection 10 mL, 10 mL, Intravenous, Once, Sindy Guadeloupe, MD  Physical exam:  Vitals:   02/21/22 1029  BP: 110/72  Pulse: 69  Resp: 18  Temp: 98.4 F (36.9 C)  SpO2: 100%  Weight: 138 lb 12.8 oz (63 kg)   Physical Exam HENT:     Mouth/Throat:     Mouth: Mucous membranes are moist.     Pharynx: Oropharynx is clear.  Cardiovascular:     Rate and Rhythm: Normal rate and regular rhythm.     Heart sounds: Normal heart sounds.  Pulmonary:     Effort: Pulmonary effort is normal.     Breath sounds: Normal breath sounds.  Abdominal:     General: Bowel sounds are normal.     Palpations: Abdomen is soft.  Skin:    General: Skin is warm and dry.  Neurological:     Mental Status: She is alert and oriented to person, place, and time.         Latest Ref Rng & Units 02/15/2022    6:23 PM  CMP  Glucose 70 - 99 mg/dL 107   BUN 6 - 20 mg/dL 8   Creatinine 0.44 - 1.00 mg/dL 0.58   Sodium 135 - 145 mmol/L 139   Potassium 3.5 - 5.1 mmol/L 3.4   Chloride 98 - 111 mmol/L 108   CO2 22 - 32 mmol/L 24   Calcium 8.9 - 10.3 mg/dL 9.6   Total Protein 6.5 - 8.1 g/dL 7.8   Total Bilirubin 0.3 - 1.2 mg/dL 0.5   Alkaline Phos 38 - 126 U/L 67   AST 15 - 41 U/L 34   ALT 0 - 44 U/L 31       Latest Ref Rng & Units 02/15/2022    6:23  PM  CBC  WBC 4.0 - 10.5 K/uL 4.4   Hemoglobin 12.0 - 15.0 g/dL 13.0   Hematocrit 36.0 - 46.0 % 38.2   Platelets 150 - 400 K/uL 297     No images are attached to the encounter.  MR Brain W Wo Contrast  Result Date: 02/20/2022 CLINICAL DATA:  Headache.  Brain metastases. EXAM: MRI HEAD WITHOUT AND WITH CONTRAST TECHNIQUE: Multiplanar, multiecho pulse sequences of the brain and surrounding structures were obtained without and with  intravenous contrast. CONTRAST:  7.2m GADAVIST GADOBUTROL 1 MMOL/ML IV SOLN COMPARISON:  None Available. FINDINGS: Brain: No acute infarct, mass effect or extra-axial collection. No acute or chronic hemorrhage. There is multifocal hyperintense T2-weighted signal within the white matter. Generalized volume loss. Fluid hyperintense T2-weighted signal in the subcortical left frontal lobe. The numerous contrast-enhancing lesions of the brain have decreased in size. New lesions: None Larger lesions: None Unchanged or smaller lesions: 1. Left cerebellum, now 3 mm, image 35 2. Left cerebellum, 8 mm, image 42 3. Right cerebellum 2 mm, image 44 4. Right cerebellum, 3 mm, image 46 5. Right temporal lobe, 4 mm, image 72 6. Right temporal lobe, 2 mm, image 75 7. Left frontal operculum, 13 mm, image 88 8. Posterior left parietal lobe, 4 mm, image 101 9. Right frontal operculum, 7 mm, image 102 10. Left frontal lobe, 2 mm, image 109 11. Left parietal lobe, 3 mm, images 113 and 117. The small foci were likely part of the larger lesion seen on series 18, image 106 of [id number] 12. Left frontal lobe, 2 mm, image 121 13. Anterior right frontal lobe, 7 mm, image 138 14. Superior left frontal lobe, 5 mm, image 142 Vascular: Major flow voids are preserved. Skull and upper cervical spine: Normal calvarium and skull base. Visualized upper cervical spine and soft tissues are normal. Sinuses/Orbits:No paranasal sinus fluid levels or advanced mucosal thickening. No mastoid or middle ear effusion. Normal orbits. IMPRESSION: Decreased size of numerous contrast-enhancing lesions of the brain, consistent with treatment response. No new lesions. Electronically Signed   By: KUlyses JarredM.D.   On: 02/20/2022 18:54   CT Head Wo Contrast  Result Date: 02/15/2022 CLINICAL DATA:  Metastatic breast cancer,, progressive headache. EXAM: CT HEAD WITHOUT CONTRAST TECHNIQUE: Contiguous axial images were obtained from the base of the skull through  the vertex without intravenous contrast. RADIATION DOSE REDUCTION: This exam was performed according to the departmental dose-optimization program which includes automated exposure control, adjustment of the mA and/or kV according to patient size and/or use of iterative reconstruction technique. COMPARISON:  MRI 12/13/2021, CT head 02/05/2021 FINDINGS: Brain: Multiple cortical metastases better appreciated on prior MRI examination are not well visualized on this exam. Patchy subcortical white matter hypodensity, more severe within the anterior and high frontal lobes bilaterally corresponds to areas of increasing vasogenic edema, better appreciated on MRI examination of 12/13/2021 in grossly unchanged from that examination. No significant mass effect or midline shift. No superimposed acute intracranial hemorrhage. Punctate focus of calcification along the left frontal operculum is stable from prior MRI examination. Ventricular size is normal. Cerebellum is unremarkable. Vascular: No hyperdense vessel or unexpected calcification. Skull: Normal. Negative for fracture or focal lesion. Sinuses/Orbits: No acute finding. Other: Mastoid air cells and middle ear cavities are clear. IMPRESSION: 1. Multiple cortical metastases better appreciated on prior MRI examination are not well visualized on this exam. Patchy subcortical white matter hypodensity, more severe within the anterior and high frontal lobes bilaterally corresponds to  areas of increasing vasogenic edema, better appreciated on MRI examination of 12/13/2021 and grossly unchanged from that examination. No significant mass effect or midline shift. No superimposed acute intracranial hemorrhage. Electronically Signed   By: Fidela Salisbury M.D.   On: 02/15/2022 19:02   ECHOCARDIOGRAM COMPLETE  Result Date: 02/08/2022    ECHOCARDIOGRAM REPORT   Patient Name:   DEZI BRAUNER BOFBPZW Date of Exam: 02/08/2022 Medical Rec #:  [id number]             Height:       62.0 in  Accession #:    [id number]            Weight:       141.0 lb Date of Birth:  06/22/1980             BSA:          1.648 m Patient Age:    65 years              BP:           Not listed in chart/Not                                                     listed in chart mmHg Patient Gender: F                     HR:           Not listed in chart bpm. Exam Location:  [facility] Procedure: 2D Echo, Cardiac Doppler, Color Doppler and Strain Analysis Indications:     Primary malignant neoplasm of breast with metastasis C50.919  History:         Patient has prior history of Echocardiogram examinations, most                  recent 10/28/2021. Anxiety, breast cancer.  Sonographer:     Sherrie Sport Referring Phys:  [id number] [facility] C Denetria Luevanos Diagnosing Phys: Kate Sable MD  Sonographer Comments: Global longitudinal strain was attempted. IMPRESSIONS  1. Left ventricular ejection fraction, by estimation, is 60 to 65%. The left ventricle has normal function. The left ventricle has no regional wall motion abnormalities. Left ventricular diastolic parameters were normal.  2. Right ventricular systolic function is normal. The right ventricular size is normal.  3. The mitral valve is normal in structure. No evidence of mitral valve regurgitation.  4. The aortic valve is tricuspid. Aortic valve regurgitation is not visualized. FINDINGS  Left Ventricle: Left ventricular ejection fraction, by estimation, is 60 to 65%. The left ventricle has normal function. The left ventricle has no regional wall motion abnormalities. Global longitudinal strain performed but not reported based on interpreter judgement due to suboptimal tracking. 3D left ventricular ejection fraction analysis performed but not reported based on interpreter judgement due to suboptimal tracking. The left ventricular internal cavity size was normal in size. There is no left ventricular hypertrophy. Left ventricular diastolic parameters were normal. Right Ventricle: The right  ventricular size is normal. No increase in right ventricular wall thickness. Right ventricular systolic function is normal. Left Atrium: Left atrial size was normal in size. Right Atrium: Right atrial size was normal in size. Pericardium: There is no evidence of pericardial effusion. Mitral Valve: The mitral valve is normal in structure. No evidence of mitral valve regurgitation.  Tricuspid Valve: The tricuspid valve is normal in structure. Tricuspid valve regurgitation is trivial. Aortic Valve: The aortic valve is tricuspid. Aortic valve regurgitation is not visualized. Aortic valve mean gradient measures 3.0 mmHg. Aortic valve peak gradient measures 5.0 mmHg. Aortic valve area, by VTI measures 3.05 cm. Pulmonic Valve: The pulmonic valve was not well visualized. Pulmonic valve regurgitation is trivial. Aorta: The aortic root is normal in size and structure. IAS/Shunts: No atrial level shunt detected by color flow Doppler.  LEFT VENTRICLE PLAX 2D LVIDd:         3.40 cm   Diastology LVIDs:         2.00 cm   LV e' medial:    8.27 cm/s LV PW:         1.00 cm   LV E/e' medial:  8.3 LV IVS:        1.10 cm   LV e' lateral:   12.30 cm/s LVOT diam:     2.00 cm   LV E/e' lateral: 5.6 LV SV:         51 LV SV Index:   31 LVOT Area:     3.14 cm                           3D Volume EF:                          3D EF:        56 %                          LV EDV:       109 ml                          LV ESV:       49 ml                          LV SV:        61 ml RIGHT VENTRICLE RV Basal diam:  3.10 cm RV Mid diam:    2.60 cm RV S prime:     11.40 cm/s TAPSE (M-mode): 1.8 cm LEFT ATRIUM           Index        RIGHT ATRIUM          Index LA diam:      2.30 cm 1.40 cm/m   RA Area:     7.04 cm LA Vol (A4C): 44.2 ml 26.82 ml/m  RA Volume:   11.00 ml 6.68 ml/m  AORTIC VALVE AV Area (Vmax):    2.35 cm AV Area (Vmean):   2.75 cm AV Area (VTI):     3.05 cm AV Vmax:           112.00 cm/s AV Vmean:          73.100 cm/s AV VTI:             0.168 m AV Peak Grad:      5.0 mmHg AV Mean Grad:      3.0 mmHg LVOT Vmax:         83.80 cm/s LVOT Vmean:        64.000 cm/s LVOT VTI:          0.163 m LVOT/AV VTI ratio: 0.97  AORTA Ao Root diam: 2.63 cm MITRAL VALVE               TRICUSPID VALVE MV Area (PHT): 3.91 cm    TR Peak grad:   19.0 mmHg MV Decel Time: 194 msec    TR Vmax:        218.00 cm/s MV E velocity: 68.70 cm/s MV A velocity: 71.80 cm/s  SHUNTS MV E/A ratio:  0.96        Systemic VTI:  0.16 m                            Systemic Diam: 2.00 cm Kate Sable MD Electronically signed by Kate Sable MD Signature Date/Time: 02/08/2022/3:26:23 PM    Final    NM Bone Scan Whole Body  Result Date: 02/01/2022 CLINICAL DATA:  History of breast cancer EXAM: NUCLEAR MEDICINE WHOLE BODY BONE SCAN TECHNIQUE: Whole body anterior and posterior images were obtained approximately 3 hours after intravenous injection of radiopharmaceutical. RADIOPHARMACEUTICALS:  21.05 mCi Technetium-88mMDP IV COMPARISON:  Multiple priors including same-day CT chest abdomen pelvis dated January 31, 2022 and nuclear medicine bone scan dated November 01, 2021 FINDINGS: New focus of abnormal radiotracer uptake in the distal right femoral metaphysis. Similar appearance of the heterogeneous increased radiotracer uptake in the proximal right femur corresponding with the known prior fracture although metastatic disease would be difficult to exclude. There is slightly increased patchy radiotracer uptake in the mid right femur which may reflect osseous changes related to the intramedullary fixation hardware although metastatic disease would be difficult to exclude. Other areas of radiotracer uptake involving the metastatic lesions in the right posterior second rib, left iliac bone and multiple thoracolumbar vertebral bodies appears similar prior. Radiotracer uptake in the T6, L1 and L4 vertebral bodies corresponding with prior vertebral body augmentation. Similar low-level  radiotracer uptake in the known healing anterolateral left third rib fracture. IMPRESSION: New focus of abnormal radiotracer uptake in the distal right femoral metaphysis, suggest correlation with dedicated bone radiographs. Similar appearance of the heterogeneous increased radiotracer uptake in the proximal right femur corresponding with the known fixated femur fracture with slightly increased patchy radiotracer uptake in the mid right femoral diaphysis which may reflect osseous changes related to the intramedullary fixation hardware although metastatic disease would be difficult to exclude. Attention on follow-up imaging and dedicated bone radiographs suggested. Otherwise no significant interval change in the multifocal radiotracer avid osseous foci. Electronically Signed   By: JDahlia BailiffM.D.   On: 02/01/2022 10:02   CT CHEST ABDOMEN PELVIS W CONTRAST  Result Date: 02/01/2022 CLINICAL DATA:  History of metastatic breast cancer, follow-up. * Tracking Code: BO * EXAM: CT CHEST, ABDOMEN, AND PELVIS WITH CONTRAST TECHNIQUE: Multidetector CT imaging of the chest, abdomen and pelvis was performed following the standard protocol during bolus administration of intravenous contrast. RADIATION DOSE REDUCTION: This exam was performed according to the departmental dose-optimization program which includes automated exposure control, adjustment of the mA and/or kV according to patient size and/or use of iterative reconstruction technique. CONTRAST:  1070mOMNIPAQUE IOHEXOL 300 MG/ML  SOLN COMPARISON:  Multiple priors including same-day nuclear medicine bone scan and CT chest abdomen pelvis dated November 01, 2021. FINDINGS: CT CHEST FINDINGS Cardiovascular: Right chest Port-A-Cath with tip in the right atrium. Scattered aortic atherosclerosis. No central pulmonary embolus on this nondedicated study. Normal size heart. No significant pericardial effusion/thickening. Mediastinum/Nodes: No supraclavicular adenopathy. No  suspicious thyroid nodule. No  pathologically enlarged mediastinal, hilar or axillary lymph nodes. The esophagus is grossly unremarkable. Lungs/Pleura: Focus of ground-glass in the dependent posterior right lower lobe is likely infectious or inflammatory. Otherwise no suspicious pulmonary nodules or masses. No pleural effusion. No pneumothorax. Musculoskeletal: Left breast surgical clip. No new suspicious breast mass. Healing fracture in the anterior left third rib on image 50/4. Please see below for complete description of findings regarding osseous metastatic disease. CT ABDOMEN PELVIS FINDINGS Hepatobiliary: No suspicious hepatic lesion. Subtle hypodensity along the falciform ligament in segment IV commonly reflects differential perfusion or fatty infiltration. Gallbladder is unremarkable. No biliary ductal dilation. Pancreas: No pancreatic ductal dilation or evidence of acute inflammation. Spleen: No splenomegaly. Adrenals/Urinary Tract: Bilateral adrenal glands appear normal. No hydronephrosis. Kidneys demonstrate symmetric enhancement. Mild wall thickening of an under distended urinary bladder. Stomach/Bowel: Stomach is minimally distended limiting evaluation. No pathologic dilation of small or large bowel. No evidence of acute bowel inflammation. Vascular/Lymphatic: Normal caliber abdominal aorta. No pathologically enlarged abdominal or pelvic lymph nodes. Reproductive: Uterus and bilateral adnexa are unremarkable. Other: No significant abdominopelvic free fluid. Musculoskeletal: Similar appearance of the multifocal chronic osseous metastases. For instance in the manubrium on image 93/7, in the posterior right second rib on image 21/4 and in the T4, T8, T10 and L2 vertebral bodies on image 94/5. No definite new osseous metastatic lesions identified. Prior cement augmentation of wedging defects at T6, L1 and L4 with similar mild bony retropulsion at L1 and retropulsion of osseous and augmentation material into  the spinal canal at L4 with associated canal and right neural foraminal narrowing. Prior cement augmentation of the right sacral ala and ilium. Partially visualized intramedullary and compression screw fixation hardware in the right femur. IMPRESSION: 1. Stable diffuse sclerotic osseous metastatic disease. No definite evidence of new or progressive osseous metastatic disease. 2. No convincing evidence of extraosseous metastatic disease within the chest, abdomen or pelvis. 3. Focus of ground-glass in the dependent posterior right lower lobe is most compatible with an infectious or inflammatory process. Attention on follow-up imaging suggested. 4. Mild wall thickening of an under distended urinary bladder. Correlate with urinalysis to exclude cystitis. 5.  Aortic Atherosclerosis (ICD10-I70.0). Electronically Signed   By: Dahlia Bailiff M.D.   On: 02/01/2022 09:52     Assessment and plan- Patient is a 41 y.o. 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  For on treatment assessment prior to next cycle of Herceptin  I have reviewed MRI brain images independently and discussed findings with the patient.  Patient was noted to have a slow-growing her brain metastases and recently underwent second round of whole brain radiation.  Present MRI does not show any evidence of new brain lesions and I will decrease in the size of existing brain lesions consistent with treatment response.  I will continue to observe her brain lesions closely with another scan in 2 months.  My plan for now is to continue Herceptin Tucatinib Xeloda regimen.  Patient states that she has a few more doses of Xeloda at home but is out of the catheter.  We will make sure that she will receive her medications soon.  I will see her back in 3 weeks for next cycle of Herceptin.  Solitary mouth sore: She will use topical Orajel for the same   Visit Diagnosis 1. Multiple pathological  fractures, initial encounter   2. Primary malignant neoplasm of breast with metastasis ([place])  Dr. Randa Evens, MD, MPH [facility] at [facility] [phone] 02/25/2022 3:50 PM

## 2022-02-27 ENCOUNTER — Other Ambulatory Visit (HOSPITAL_COMMUNITY): Payer: Self-pay

## 2022-02-28 ENCOUNTER — Encounter: Payer: Self-pay | Admitting: Oncology

## 2022-02-28 ENCOUNTER — Other Ambulatory Visit: Payer: Self-pay

## 2022-02-28 ENCOUNTER — Ambulatory Visit: Payer: Self-pay

## 2022-02-28 ENCOUNTER — Inpatient Hospital Stay: Payer: Self-pay

## 2022-02-28 ENCOUNTER — Other Ambulatory Visit: Payer: Self-pay | Admitting: *Deleted

## 2022-02-28 ENCOUNTER — Inpatient Hospital Stay (HOSPITAL_BASED_OUTPATIENT_CLINIC_OR_DEPARTMENT_OTHER): Payer: Self-pay | Admitting: Hospice and Palliative Medicine

## 2022-02-28 ENCOUNTER — Other Ambulatory Visit: Payer: Self-pay | Admitting: Oncology

## 2022-02-28 VITALS — BP 117/77 | HR 95 | Temp 100.9°F | Resp 18 | Wt 139.8 lb

## 2022-02-28 DIAGNOSIS — M8440XA Pathological fracture, unspecified site, initial encounter for fracture: Secondary | ICD-10-CM

## 2022-02-28 DIAGNOSIS — J069 Acute upper respiratory infection, unspecified: Secondary | ICD-10-CM

## 2022-02-28 DIAGNOSIS — C50919 Malignant neoplasm of unspecified site of unspecified female breast: Secondary | ICD-10-CM

## 2022-02-28 DIAGNOSIS — Z95828 Presence of other vascular implants and grafts: Secondary | ICD-10-CM

## 2022-02-28 LAB — CBC WITH DIFFERENTIAL/PLATELET
Abs Immature Granulocytes: 0.19 10*3/uL — ABNORMAL HIGH (ref 0.00–0.07)
Basophils Absolute: 0 10*3/uL (ref 0.0–0.1)
Basophils Relative: 0 %
Eosinophils Absolute: 0.2 10*3/uL (ref 0.0–0.5)
Eosinophils Relative: 2 %
HCT: 38.7 % (ref 36.0–46.0)
Hemoglobin: 13.3 g/dL (ref 12.0–15.0)
Immature Granulocytes: 1 %
Lymphocytes Relative: 5 %
Lymphs Abs: 0.7 10*3/uL (ref 0.7–4.0)
MCH: 31.5 pg (ref 26.0–34.0)
MCHC: 34.4 g/dL (ref 30.0–36.0)
MCV: 91.7 fL (ref 80.0–100.0)
Monocytes Absolute: 0.7 10*3/uL (ref 0.1–1.0)
Monocytes Relative: 5 %
Neutro Abs: 12.7 10*3/uL — ABNORMAL HIGH (ref 1.7–7.7)
Neutrophils Relative %: 87 %
Platelets: 159 10*3/uL (ref 150–400)
RBC: 4.22 MIL/uL (ref 3.87–5.11)
RDW: 17.6 % — ABNORMAL HIGH (ref 11.5–15.5)
WBC: 14.5 10*3/uL — ABNORMAL HIGH (ref 4.0–10.5)
nRBC: 0 % (ref 0.0–0.2)

## 2022-02-28 LAB — COMPREHENSIVE METABOLIC PANEL
ALT: 22 U/L (ref 0–44)
AST: 31 U/L (ref 15–41)
Albumin: 3.4 g/dL — ABNORMAL LOW (ref 3.5–5.0)
Alkaline Phosphatase: 64 U/L (ref 38–126)
Anion gap: 8 (ref 5–15)
BUN: 8 mg/dL (ref 6–20)
CO2: 25 mmol/L (ref 22–32)
Calcium: 7.9 mg/dL — ABNORMAL LOW (ref 8.9–10.3)
Chloride: 103 mmol/L (ref 98–111)
Creatinine, Ser: 0.56 mg/dL (ref 0.44–1.00)
GFR, Estimated: >60 mL/min (ref >60)
Glucose, Bld: 87 mg/dL (ref 70–99)
Potassium: 3.6 mmol/L (ref 3.5–5.1)
Sodium: 136 mmol/L (ref 135–145)
Total Bilirubin: 1 mg/dL (ref 0.3–1.2)
Total Protein: 6.1 g/dL — ABNORMAL LOW (ref 6.5–8.1)

## 2022-02-28 MED ORDER — HEPARIN SOD (PORK) LOCK FLUSH 100 UNIT/ML IV SOLN
500.0000 [IU] | Freq: Once | INTRAVENOUS | Status: AC
  Administered 2022-02-28: 500 [IU] via INTRAVENOUS
  Filled 2022-02-28: qty 5

## 2022-02-28 MED ORDER — VALACYCLOVIR HCL 1 G PO TABS
1000.0000 mg | ORAL_TABLET | Freq: Two times a day (BID) | ORAL | 0 refills | Status: DC
  Filled 2022-02-28: qty 14, 7d supply, fill #0

## 2022-02-28 NOTE — Progress Notes (Signed)
Pt not feeling well, vitals reported to team , [name] Borders CRNP to see pt

## 2022-02-28 NOTE — Progress Notes (Signed)
Symptom Management [place] at [facility] Telephone:(336) [phone] Fax:(336) [phone]  Patient Care Team: Sindy Guadeloupe, MD as PCP - General (Oncology) Rico Junker, RN as Registered Nurse Theodore Demark, RN (Inactive) as Registered Nurse Sindy Guadeloupe, MD as Consulting Physician (Hematology and Oncology)   NAME OF PATIENT: Sophia [name]  [id number]  04/21/1980   DATE OF VISIT: 02/28/22  REASON FOR CONSULT: Ursala Cressy is a 41 y.o. 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 MRI of the brain on 02/08/2021 revealed widespread metastatic disease to the brain with mild associated edema and small area of hemorrhage in the right frontal.  Patient is status post whole brain radiation.  She completed Taxol and iwas on Herceptin and Perjeta maintenance.  She is now on trastuzumab with Xeloda and tucatinib.   INTERVAL HISTORY: Patient was added on to clinic today.  She was seen in infusion.  Patient reports 24 hours of runny nose, sore throat, occasional nonproductive cough, body aches.  She was noted to have fever of 100.9 upon arrival to clinic.  She also has developed a sore at the margin of her lower lip.  She feels fatigued.  She denies other symptomatic complaints or concerns such as shortness of breath or wheezing.  No active nausea, vomiting, diarrhea, or constipation.  No GU symptoms.  Patient offers no further specific complaints today.   PAST MEDICAL HISTORY: Past Medical History:  Diagnosis Date   Anxiety    Breast cancer ([place])    with mets   Cancer ([place])    Colitis    COVID-19 in immunocompromised patient ([place])    Family history of colon cancer    Vertigo     PAST SURGICAL HISTORY:  Past Surgical History:  Procedure Laterality Date   BREAST BIOPSY Left 08/14/2019   [place] bx of mass, coil marker, path pending   BREAST BIOPSY Left  08/14/2019   [place] bx of LN, hydromarker, path pending   BREAST BIOPSY Left 08/14/2019   affirm bx of calcs, x marker, path pending   ESOPHAGOGASTRODUODENOSCOPY (EGD) WITH PROPOFOL N/A 10/05/2019   Procedure: ESOPHAGOGASTRODUODENOSCOPY (EGD) WITH PROPOFOL;  Surgeon: Lin Landsman, MD;  Location: [place];  Service: Gastroenterology;  Laterality: N/A;   FLEXIBLE SIGMOIDOSCOPY N/A 10/05/2019   Procedure: FLEXIBLE SIGMOIDOSCOPY;  Surgeon: Lin Landsman, MD;  Location[facility] ENDOSCOPY;  Service: Gastroenterology;  Laterality: N/A;   INTRAMEDULLARY (IM) NAIL INTERTROCHANTERIC Right 09/01/2019   Procedure: INTRAMEDULLARY (IM) NAIL INTERTROCHANTRIC AND RADIOFREQUENCY ABLATION;  Surgeon: Hessie Knows, MD;  Location: [facility] ORS;  Service: Orthopedics;  Laterality: Right;   KYPHOPLASTY N/A 08/29/2019   Procedure: KYPHOPLASTY T6, L1,L4 ,  RADIOFREQUENCY ABLATION;  Surgeon: Hessie Knows, MD;  Location: [facility] ORS;  Service: Orthopedics;  Laterality: N/A;   KYPHOPLASTY Right 09/01/2019   Procedure: Right Sacral Radiofrequency Ablation and Cement Augmentation, Right sacrum and iliac crest;  Surgeon: Hessie Knows, MD;  Location: [facility] ORS;  Service: Orthopedics;  Laterality: Right;   PORTA CATH INSERTION N/A 08/28/2019   Procedure: PORTA CATH INSERTION;  Surgeon: Algernon Huxley, MD;  Location: [place] CV LAB;  Service: Cardiovascular;  Laterality: N/A;    HEMATOLOGY/ONCOLOGY HISTORY:  Oncology History  Primary cancer of left breast with metastasis to other site [facility])  08/28/2019 Initial Diagnosis   Primary cancer of left breast with metastasis to other site [facility])   01/21/2021 Cancer Staging   Staging  form: Breast, AJCC 8th Edition - Clinical stage from 01/21/2021: Stage IV (cT2, cN1, cM1, G2, ER-, PR-, HER2+) - Signed by Sindy Guadeloupe, MD on 01/21/2021 Histologic grading system: 3 grade system   Primary malignant neoplasm of breast with metastasis ([place])  08/29/2019 Initial Diagnosis    Metastatic breast cancer ([place])   09/04/2019 - 10/31/2021 Chemotherapy   Patient is on Treatment Plan : BREAST Weekly Paclitaxel + Trastuzumab + Pertuzumab q21d      Genetic Testing   Negative genetic testing. No pathogenic variants identified on the [facility] CancerNext-Expanded+RNA panel. The report date is 04/13/2020.   The CancerNext-Expanded + RNAinsight gene panel offered by [employer] and includes sequencing and rearrangement analysis for the following 77 genes: IP, ALK, APC*, ATM*, AXIN2, BAP1, BARD1, BLM, BMPR1A, BRCA1*, BRCA2*, BRIP1*, CDC73, CDH1*,CDK4, CDKN1B, CDKN2A, CHEK2*, CTNNA1, DICER1, FANCC, FH, FLCN, GALNT12, KIF1B, LZTR1, MAX, MEN1, MET, MLH1*, MSH2*, MSH3, MSH6*, MUTYH*, NBN, NF1*, NF2, NTHL1, PALB2*, PHOX2B, PMS2*, POT1, PRKAR1A, PTCH1, PTEN*, RAD51C*, RAD51D*,RB1, RECQL, RET, SDHA, SDHAF2, SDHB, SDHC, SDHD, SMAD4, SMARCA4, SMARCB1, SMARCE1, STK11, SUFU, TMEM127, TP53*,TSC1, TSC2, VHL and XRCC2 (sequencing and deletion/duplication); EGFR, EGLN1, HOXB13, KIT, MITF, PDGFRA, POLD1 and POLE (sequencing only); EPCAM and GREM1 (deletion/duplication only). DNA and RNA analyses performed for * genes.   11/18/2021 - 11/18/2021 Chemotherapy   Patient is on Treatment Plan : BREAST Trastuzumab (8/6) IV D1 + Capecitabine +  Tucatinib q21d     11/18/2021 - 12/09/2021 Chemotherapy   Patient is on Treatment Plan : BREAST Trastuzumab (8/6) IV D1 + Capecitabine +  Tucatinib q21d     12/26/2021 -  Chemotherapy   Patient is on Treatment Plan : BREAST Trastuzumab (8/6) IV D1 + Capecitabine +  Tucatinib q21d       ALLERGIES:  has No Known Allergies.  MEDICATIONS:  Current Outpatient Medications  Medication Sig Dispense Refill   albuterol (VENTOLIN HFA) 108 (90 Base) MCG/ACT inhaler Inhale 2 puffs into the lungs every 6 (six) hours as needed for wheezing or shortness of breath. (Patient not taking: Reported on 11/18/2021) 8.5 g 2   benzoyl peroxide-erythromycin (BENZAMYCIN) gel Apply topically 2 (two)  times daily. (Patient not taking: Reported on 09/09/2021) 46.6 g 0   butalbital-acetaminophen-caffeine (FIORICET) 50-325-40 MG tablet Take 1-2 tablets by mouth every 8 (eight) hours as needed for headache. 30 tablet 0   Calcium Carb-Cholecalciferol 600-10 MG-MCG TABS Take 1 tablet by mouth 2 (two) times daily. 180 tablet 0   capecitabine (XELODA) 500 MG tablet Take 3 tablets (1,500 mg total) by mouth 2 (two) times daily after a meal. Take for 14 days, then hold for 7 days. Repeat every 21 days. 84 tablet 1   dexamethasone (DECADRON) 4 MG tablet Take 2 tablets (8 mg total) by mouth 2 (two) times daily. 120 tablet 0   diphenoxylate-atropine (LOMOTIL) 2.5-0.025 MG tablet Take 1 tablet by mouth 4 (four) times daily as needed for diarrhea or loose stools. 40 tablet 0   DULoxetine (CYMBALTA) 60 MG capsule Take 1 capsule (60 mg total) by mouth daily. 30 capsule 3   gabapentin (NEURONTIN) 600 MG tablet Take 1 tablet (600 mg total) by mouth 3 (three) times daily. 90 tablet 3   lidocaine-prilocaine (EMLA) cream Apply 1 application topically as needed. Apply small amount to port site at least 1 hour prior to it being accessed, cover with plastic wrap 30 g 3   LORazepam (ATIVAN) 0.5 MG tablet Take 1 tablet (0.5 mg total) by mouth every 6 (six) hours  as needed for anxiety (and nausea). AS NEEDED FOR NAUSEA 45 tablet 0   morphine (MS CONTIN) 15 MG 12 hr tablet Take 1 tablet (15 mg total) by mouth every 8 (eight) hours. 90 tablet 0   mupirocin ointment (BACTROBAN) 2 % Apply 1 Application topically 2 (two) times daily. 22 g 0   naloxone (NARCAN) nasal spray 4 mg/0.1 mL Administer one spray in nostril for opioid overdose. If unresponsive after 1 dose, may repeat a dose every 2-3 minutes in alternating nostrils until emergency services arrive. 2 each 3   OLANZapine (ZYPREXA) 10 MG tablet Take 1 tablet (10 mg total) by mouth at bedtime as needed (nausea). 30 tablet 2   omeprazole (PRILOSEC) 20 MG capsule Take 1 capsule  (20 mg total) by mouth daily. 30 capsule 3   ondansetron (ZOFRAN) 8 MG tablet Take 1 tablet (8 mg total) by mouth every 8 (eight) hours as needed for nausea or vomiting. 45 tablet 2   Oxycodone HCl 10 MG TABS Take 1/2 to 1 tablet (5-10 mg total) by mouth every 4 (four) hours as needed. 45 tablet 0   polyethylene glycol powder (MIRALAX) 17 GM/SCOOP powder Take 17 g by mouth daily as needed. 238 g 3   potassium chloride SA (KLOR-CON M) 20 MEQ tablet Take 1 tablet (20 mEq total) by mouth daily. 30 tablet 0   tucatinib (TUKYSA) 150 MG tablet Take 2 tablets (300 mg total) by mouth 2 (two) times daily. 120 tablet 3   No current facility-administered medications for this visit.   Facility-Administered Medications Ordered in Other Visits  Medication Dose Route Frequency Provider Last Rate Last Admin   0.9 %  sodium chloride infusion   Intravenous Continuous Jacquelin Hawking, NP   Stopped at 06/02/20 1453   heparin lock flush 100 unit/mL  500 Units Intravenous Once Sindy Guadeloupe, MD       heparin lock flush 100 unit/mL  500 Units Intravenous Once Sindy Guadeloupe, MD       sodium chloride flush (NS) 0.9 % injection 10 mL  10 mL Intravenous Once Sindy Guadeloupe, MD       sodium chloride flush (NS) 0.9 % injection 10 mL  10 mL Intravenous Once Sindy Guadeloupe, MD        VITAL SIGNS: There were no vitals taken for this visit. There were no vitals filed for this visit.  Estimated body mass index is 25.56 kg/m as calculated from the following:   Height as of 02/15/22: _0  (1.575 m).   Weight as of an earlier encounter on 02/28/22: 139 lb 12.4 oz (63.4 kg).  LABS: CBC:    Component Value Date/Time   WBC 14.5 (H) 02/28/2022 1001   HGB 13.3 02/28/2022 1001   HCT 38.7 02/28/2022 1001   PLT 159 02/28/2022 1001   MCV 91.7 02/28/2022 1001   NEUTROABS 12.7 (H) 02/28/2022 1001   LYMPHSABS 0.7 02/28/2022 1001   MONOABS 0.7 02/28/2022 1001   EOSABS 0.2 02/28/2022 1001   BASOSABS 0.0 02/28/2022 1001    Comprehensive Metabolic Panel:    Component Value Date/Time   NA 136 02/28/2022 1001   K 3.6 02/28/2022 1001   CL 103 02/28/2022 1001   CO2 25 02/28/2022 1001   BUN 8 02/28/2022 1001   CREATININE 0.56 02/28/2022 1001   GLUCOSE 87 02/28/2022 1001   CALCIUM 7.9 (L) 02/28/2022 1001   AST 31 02/28/2022 1001   ALT 22 02/28/2022 1001   ALKPHOS  64 02/28/2022 1001   BILITOT 1.0 02/28/2022 1001   PROT 6.1 (L) 02/28/2022 1001   ALBUMIN 3.4 (L) 02/28/2022 1001    RADIOGRAPHIC STUDIES: MR Brain W Wo Contrast  Result Date: 02/20/2022 CLINICAL DATA:  Headache.  Brain metastases. EXAM: MRI HEAD WITHOUT AND WITH CONTRAST TECHNIQUE: Multiplanar, multiecho pulse sequences of the brain and surrounding structures were obtained without and with intravenous contrast. CONTRAST:  7.52m GADAVIST GADOBUTROL 1 MMOL/ML IV SOLN COMPARISON:  None Available. FINDINGS: Brain: No acute infarct, mass effect or extra-axial collection. No acute or chronic hemorrhage. There is multifocal hyperintense T2-weighted signal within the white matter. Generalized volume loss. Fluid hyperintense T2-weighted signal in the subcortical left frontal lobe. The numerous contrast-enhancing lesions of the brain have decreased in size. New lesions: None Larger lesions: None Unchanged or smaller lesions: 1. Left cerebellum, now 3 mm, image 35 2. Left cerebellum, 8 mm, image 42 3. Right cerebellum 2 mm, image 44 4. Right cerebellum, 3 mm, image 46 5. Right temporal lobe, 4 mm, image 72 6. Right temporal lobe, 2 mm, image 75 7. Left frontal operculum, 13 mm, image 88 8. Posterior left parietal lobe, 4 mm, image 101 9. Right frontal operculum, 7 mm, image 102 10. Left frontal lobe, 2 mm, image 109 11. Left parietal lobe, 3 mm, images 113 and 117. The small foci were likely part of the larger lesion seen on series 18, image 106 of [id number] 12. Left frontal lobe, 2 mm, image 121 13. Anterior right frontal lobe, 7 mm, image 138 14. Superior left  frontal lobe, 5 mm, image 142 Vascular: Major flow voids are preserved. Skull and upper cervical spine: Normal calvarium and skull base. Visualized upper cervical spine and soft tissues are normal. Sinuses/Orbits:No paranasal sinus fluid levels or advanced mucosal thickening. No mastoid or middle ear effusion. Normal orbits. IMPRESSION: Decreased size of numerous contrast-enhancing lesions of the brain, consistent with treatment response. No new lesions. Electronically Signed   By: KUlyses JarredM.D.   On: 02/20/2022 18:54   CT Head Wo Contrast  Result Date: 02/15/2022 CLINICAL DATA:  Metastatic breast cancer,, progressive headache. EXAM: CT HEAD WITHOUT CONTRAST TECHNIQUE: Contiguous axial images were obtained from the base of the skull through the vertex without intravenous contrast. RADIATION DOSE REDUCTION: This exam was performed according to the departmental dose-optimization program which includes automated exposure control, adjustment of the mA and/or kV according to patient size and/or use of iterative reconstruction technique. COMPARISON:  MRI 12/13/2021, CT head 02/05/2021 FINDINGS: Brain: Multiple cortical metastases better appreciated on prior MRI examination are not well visualized on this exam. Patchy subcortical white matter hypodensity, more severe within the anterior and high frontal lobes bilaterally corresponds to areas of increasing vasogenic edema, better appreciated on MRI examination of 12/13/2021 in grossly unchanged from that examination. No significant mass effect or midline shift. No superimposed acute intracranial hemorrhage. Punctate focus of calcification along the left frontal operculum is stable from prior MRI examination. Ventricular size is normal. Cerebellum is unremarkable. Vascular: No hyperdense vessel or unexpected calcification. Skull: Normal. Negative for fracture or focal lesion. Sinuses/Orbits: No acute finding. Other: Mastoid air cells and middle ear cavities are  clear. IMPRESSION: 1. Multiple cortical metastases better appreciated on prior MRI examination are not well visualized on this exam. Patchy subcortical white matter hypodensity, more severe within the anterior and high frontal lobes bilaterally corresponds to areas of increasing vasogenic edema, better appreciated on MRI examination of 12/13/2021 and grossly unchanged from that examination.  No significant mass effect or midline shift. No superimposed acute intracranial hemorrhage. Electronically Signed   By: Fidela Salisbury M.D.   On: 02/15/2022 19:02   ECHOCARDIOGRAM COMPLETE  Result Date: 02/08/2022    ECHOCARDIOGRAM REPORT   Patient Name:   BURLENE MONTECALVO HUTMLYY Date of Exam: 02/08/2022 Medical Rec #:  [id number]             Height:       62.0 in Accession #:    [id number]            Weight:       141.0 lb Date of Birth:  08-20-1980             BSA:          1.648 m Patient Age:    37 years              BP:           Not listed in chart/Not                                                     listed in chart mmHg Patient Gender: F                     HR:           Not listed in chart bpm. Exam Location:  [facility] Procedure: 2D Echo, Cardiac Doppler, Color Doppler and Strain Analysis Indications:     Primary malignant neoplasm of breast with metastasis C50.919  History:         Patient has prior history of Echocardiogram examinations, most                  recent 10/28/2021. Anxiety, breast cancer.  Sonographer:     Sherrie Sport Referring Phys:  [id number] [facility] C RAO Diagnosing Phys: Kate Sable MD  Sonographer Comments: Global longitudinal strain was attempted. IMPRESSIONS  1. Left ventricular ejection fraction, by estimation, is 60 to 65%. The left ventricle has normal function. The left ventricle has no regional wall motion abnormalities. Left ventricular diastolic parameters were normal.  2. Right ventricular systolic function is normal. The right ventricular size is normal.  3. The mitral valve is normal in  structure. No evidence of mitral valve regurgitation.  4. The aortic valve is tricuspid. Aortic valve regurgitation is not visualized. FINDINGS  Left Ventricle: Left ventricular ejection fraction, by estimation, is 60 to 65%. The left ventricle has normal function. The left ventricle has no regional wall motion abnormalities. Global longitudinal strain performed but not reported based on interpreter judgement due to suboptimal tracking. 3D left ventricular ejection fraction analysis performed but not reported based on interpreter judgement due to suboptimal tracking. The left ventricular internal cavity size was normal in size. There is no left ventricular hypertrophy. Left ventricular diastolic parameters were normal. Right Ventricle: The right ventricular size is normal. No increase in right ventricular wall thickness. Right ventricular systolic function is normal. Left Atrium: Left atrial size was normal in size. Right Atrium: Right atrial size was normal in size. Pericardium: There is no evidence of pericardial effusion. Mitral Valve: The mitral valve is normal in structure. No evidence of mitral valve regurgitation. Tricuspid Valve: The tricuspid valve is normal in structure. Tricuspid valve regurgitation is trivial. Aortic Valve: The aortic valve  is tricuspid. Aortic valve regurgitation is not visualized. Aortic valve mean gradient measures 3.0 mmHg. Aortic valve peak gradient measures 5.0 mmHg. Aortic valve area, by VTI measures 3.05 cm. Pulmonic Valve: The pulmonic valve was not well visualized. Pulmonic valve regurgitation is trivial. Aorta: The aortic root is normal in size and structure. IAS/Shunts: No atrial level shunt detected by color flow Doppler.  LEFT VENTRICLE PLAX 2D LVIDd:         3.40 cm   Diastology LVIDs:         2.00 cm   LV e' medial:    8.27 cm/s LV PW:         1.00 cm   LV E/e' medial:  8.3 LV IVS:        1.10 cm   LV e' lateral:   12.30 cm/s LVOT diam:     2.00 cm   LV E/e' lateral: 5.6  LV SV:         51 LV SV Index:   31 LVOT Area:     3.14 cm                           3D Volume EF:                          3D EF:        56 %                          LV EDV:       109 ml                          LV ESV:       49 ml                          LV SV:        61 ml RIGHT VENTRICLE RV Basal diam:  3.10 cm RV Mid diam:    2.60 cm RV S prime:     11.40 cm/s TAPSE (M-mode): 1.8 cm LEFT ATRIUM           Index        RIGHT ATRIUM          Index LA diam:      2.30 cm 1.40 cm/m   RA Area:     7.04 cm LA Vol (A4C): 44.2 ml 26.82 ml/m  RA Volume:   11.00 ml 6.68 ml/m  AORTIC VALVE AV Area (Vmax):    2.35 cm AV Area (Vmean):   2.75 cm AV Area (VTI):     3.05 cm AV Vmax:           112.00 cm/s AV Vmean:          73.100 cm/s AV VTI:            0.168 m AV Peak Grad:      5.0 mmHg AV Mean Grad:      3.0 mmHg LVOT Vmax:         83.80 cm/s LVOT Vmean:        64.000 cm/s LVOT VTI:          0.163 m LVOT/AV VTI ratio: 0.97  AORTA Ao Root diam: 2.63 cm MITRAL VALVE  TRICUSPID VALVE MV Area (PHT): 3.91 cm    TR Peak grad:   19.0 mmHg MV Decel Time: 194 msec    TR Vmax:        218.00 cm/s MV E velocity: 68.70 cm/s MV A velocity: 71.80 cm/s  SHUNTS MV E/A ratio:  0.96        Systemic VTI:  0.16 m                            Systemic Diam: 2.00 cm Kate Sable MD Electronically signed by Kate Sable MD Signature Date/Time: 02/08/2022/3:26:23 PM    Final    NM Bone Scan Whole Body  Result Date: 02/01/2022 CLINICAL DATA:  History of breast cancer EXAM: NUCLEAR MEDICINE WHOLE BODY BONE SCAN TECHNIQUE: Whole body anterior and posterior images were obtained approximately 3 hours after intravenous injection of radiopharmaceutical. RADIOPHARMACEUTICALS:  21.05 mCi Technetium-73mMDP IV COMPARISON:  Multiple priors including same-day CT chest abdomen pelvis dated January 31, 2022 and nuclear medicine bone scan dated November 01, 2021 FINDINGS: New focus of abnormal radiotracer uptake in the distal  right femoral metaphysis. Similar appearance of the heterogeneous increased radiotracer uptake in the proximal right femur corresponding with the known prior fracture although metastatic disease would be difficult to exclude. There is slightly increased patchy radiotracer uptake in the mid right femur which may reflect osseous changes related to the intramedullary fixation hardware although metastatic disease would be difficult to exclude. Other areas of radiotracer uptake involving the metastatic lesions in the right posterior second rib, left iliac bone and multiple thoracolumbar vertebral bodies appears similar prior. Radiotracer uptake in the T6, L1 and L4 vertebral bodies corresponding with prior vertebral body augmentation. Similar low-level radiotracer uptake in the known healing anterolateral left third rib fracture. IMPRESSION: New focus of abnormal radiotracer uptake in the distal right femoral metaphysis, suggest correlation with dedicated bone radiographs. Similar appearance of the heterogeneous increased radiotracer uptake in the proximal right femur corresponding with the known fixated femur fracture with slightly increased patchy radiotracer uptake in the mid right femoral diaphysis which may reflect osseous changes related to the intramedullary fixation hardware although metastatic disease would be difficult to exclude. Attention on follow-up imaging and dedicated bone radiographs suggested. Otherwise no significant interval change in the multifocal radiotracer avid osseous foci. Electronically Signed   By: JDahlia BailiffM.D.   On: 02/01/2022 10:02   CT CHEST ABDOMEN PELVIS W CONTRAST  Result Date: 02/01/2022 CLINICAL DATA:  History of metastatic breast cancer, follow-up. * Tracking Code: BO * EXAM: CT CHEST, ABDOMEN, AND PELVIS WITH CONTRAST TECHNIQUE: Multidetector CT imaging of the chest, abdomen and pelvis was performed following the standard protocol during bolus administration of  intravenous contrast. RADIATION DOSE REDUCTION: This exam was performed according to the departmental dose-optimization program which includes automated exposure control, adjustment of the mA and/or kV according to patient size and/or use of iterative reconstruction technique. CONTRAST:  108mOMNIPAQUE IOHEXOL 300 MG/ML  SOLN COMPARISON:  Multiple priors including same-day nuclear medicine bone scan and CT chest abdomen pelvis dated November 01, 2021. FINDINGS: CT CHEST FINDINGS Cardiovascular: Right chest Port-A-Cath with tip in the right atrium. Scattered aortic atherosclerosis. No central pulmonary embolus on this nondedicated study. Normal size heart. No significant pericardial effusion/thickening. Mediastinum/Nodes: No supraclavicular adenopathy. No suspicious thyroid nodule. No pathologically enlarged mediastinal, hilar or axillary lymph nodes. The esophagus is grossly unremarkable. Lungs/Pleura: Focus of ground-glass in the dependent posterior right  lower lobe is likely infectious or inflammatory. Otherwise no suspicious pulmonary nodules or masses. No pleural effusion. No pneumothorax. Musculoskeletal: Left breast surgical clip. No new suspicious breast mass. Healing fracture in the anterior left third rib on image 50/4. Please see below for complete description of findings regarding osseous metastatic disease. CT ABDOMEN PELVIS FINDINGS Hepatobiliary: No suspicious hepatic lesion. Subtle hypodensity along the falciform ligament in segment IV commonly reflects differential perfusion or fatty infiltration. Gallbladder is unremarkable. No biliary ductal dilation. Pancreas: No pancreatic ductal dilation or evidence of acute inflammation. Spleen: No splenomegaly. Adrenals/Urinary Tract: Bilateral adrenal glands appear normal. No hydronephrosis. Kidneys demonstrate symmetric enhancement. Mild wall thickening of an under distended urinary bladder. Stomach/Bowel: Stomach is minimally distended limiting evaluation.  No pathologic dilation of small or large bowel. No evidence of acute bowel inflammation. Vascular/Lymphatic: Normal caliber abdominal aorta. No pathologically enlarged abdominal or pelvic lymph nodes. Reproductive: Uterus and bilateral adnexa are unremarkable. Other: No significant abdominopelvic free fluid. Musculoskeletal: Similar appearance of the multifocal chronic osseous metastases. For instance in the manubrium on image 93/7, in the posterior right second rib on image 21/4 and in the T4, T8, T10 and L2 vertebral bodies on image 94/5. No definite new osseous metastatic lesions identified. Prior cement augmentation of wedging defects at T6, L1 and L4 with similar mild bony retropulsion at L1 and retropulsion of osseous and augmentation material into the spinal canal at L4 with associated canal and right neural foraminal narrowing. Prior cement augmentation of the right sacral ala and ilium. Partially visualized intramedullary and compression screw fixation hardware in the right femur. IMPRESSION: 1. Stable diffuse sclerotic osseous metastatic disease. No definite evidence of new or progressive osseous metastatic disease. 2. No convincing evidence of extraosseous metastatic disease within the chest, abdomen or pelvis. 3. Focus of ground-glass in the dependent posterior right lower lobe is most compatible with an infectious or inflammatory process. Attention on follow-up imaging suggested. 4. Mild wall thickening of an under distended urinary bladder. Correlate with urinalysis to exclude cystitis. 5.  Aortic Atherosclerosis (ICD10-I70.0). Electronically Signed   By: Dahlia Bailiff M.D.   On: 02/01/2022 09:52    PERFORMANCE STATUS (ECOG) : 2 - Symptomatic, <50% confined to bed  Review of Systems Unless otherwise noted, a complete review of systems is negative.  Physical Exam General: NAD HEENT: OP red without exudate, no lymphadenopathy, Sore on margin of the lower lip Cardiovascular: regular rate and  rhythm Pulmonary: clear ant fields Abdomen: soft, nontender, + bowel sounds GU: no suprapubic tenderness Extremities: no edema, no joint deformities Skin: no rashes Neurological: Weakness but otherwise nonfocal  IMPRESSION/PLAN: URI - Symptoms appear viral in etiology.  Will check labs today.  Recommended RSV/COVID/flu testing.  Discussed with Dr. Janese Banks and will hold treatment today and reschedule for 1 week.  Patient instructed to also hold Xeloda and tucatinib. Will send rx for Valtrex for treatment of cold sore.  RTC next week.  Case and plan discussed with Dr. Janese Banks.  Patient expressed understanding and was in agreement with this plan. She also understands that She can call clinic at any time with any questions, concerns, or complaints.   Thank you for allowing me to participate in the care of this very pleasant patient.   Time Total: 20 minutes  Visit consisted of counseling and education dealing with the complex and emotionally intense issues of symptom management in the setting of serious illness.Greater than 50%  of this time was spent counseling and coordinating care related to  the above assessment and plan.  Signed by: Altha Harm, PhD, NP-C

## 2022-03-07 ENCOUNTER — Inpatient Hospital Stay: Payer: Self-pay

## 2022-03-07 ENCOUNTER — Other Ambulatory Visit: Payer: Self-pay | Admitting: Oncology

## 2022-03-08 ENCOUNTER — Encounter: Payer: Self-pay | Admitting: Oncology

## 2022-03-08 ENCOUNTER — Ambulatory Visit: Payer: Self-pay

## 2022-03-08 ENCOUNTER — Inpatient Hospital Stay: Payer: Self-pay

## 2022-03-08 ENCOUNTER — Other Ambulatory Visit: Payer: Self-pay | Admitting: Oncology

## 2022-03-08 ENCOUNTER — Inpatient Hospital Stay (HOSPITAL_BASED_OUTPATIENT_CLINIC_OR_DEPARTMENT_OTHER): Payer: Self-pay | Admitting: Hospice and Palliative Medicine

## 2022-03-08 ENCOUNTER — Ambulatory Visit: Payer: Self-pay | Admitting: Oncology

## 2022-03-08 ENCOUNTER — Other Ambulatory Visit: Payer: Self-pay

## 2022-03-08 VITALS — BP 107/72 | HR 83 | Temp 98.1°F | Resp 16 | Wt 138.8 lb

## 2022-03-08 DIAGNOSIS — C50919 Malignant neoplasm of unspecified site of unspecified female breast: Secondary | ICD-10-CM

## 2022-03-08 DIAGNOSIS — J069 Acute upper respiratory infection, unspecified: Secondary | ICD-10-CM

## 2022-03-08 DIAGNOSIS — M8440XA Pathological fracture, unspecified site, initial encounter for fracture: Secondary | ICD-10-CM

## 2022-03-08 MED ORDER — SODIUM CHLORIDE 0.9 % IV SOLN
Freq: Once | INTRAVENOUS | Status: AC
  Filled 2022-03-08: qty 250

## 2022-03-08 MED ORDER — ACETAMINOPHEN 325 MG PO TABS
650.0000 mg | ORAL_TABLET | Freq: Once | ORAL | Status: AC
  Administered 2022-03-08: 650 mg via ORAL
  Filled 2022-03-08: qty 2

## 2022-03-08 MED ORDER — HEPARIN SOD (PORK) LOCK FLUSH 100 UNIT/ML IV SOLN
500.0000 [IU] | Freq: Once | INTRAVENOUS | Status: AC | PRN
  Administered 2022-03-08: 500 [IU]
  Filled 2022-03-08: qty 5

## 2022-03-08 MED ORDER — AZITHROMYCIN 250 MG PO TABS
ORAL_TABLET | ORAL | 0 refills | Status: AC
  Filled 2022-03-08: qty 6, 5d supply, fill #0

## 2022-03-08 MED ORDER — DIPHENHYDRAMINE HCL 25 MG PO CAPS
50.0000 mg | ORAL_CAPSULE | Freq: Once | ORAL | Status: AC
  Administered 2022-03-08: 50 mg via ORAL
  Filled 2022-03-08: qty 2

## 2022-03-08 MED ORDER — ALBUTEROL SULFATE HFA 108 (90 BASE) MCG/ACT IN AERS
2.0000 | INHALATION_SPRAY | Freq: Four times a day (QID) | RESPIRATORY_TRACT | 2 refills | Status: DC | PRN
  Filled 2022-03-08: qty 8.5, 25d supply, fill #0

## 2022-03-08 MED ORDER — TRASTUZUMAB-ANNS CHEMO 150 MG IV SOLR
6.0000 mg/kg | Freq: Once | INTRAVENOUS | Status: AC
  Administered 2022-03-08: 378 mg via INTRAVENOUS
  Filled 2022-03-08: qty 18

## 2022-03-08 MED ORDER — GUAIFENESIN-CODEINE 100-10 MG/5ML PO SOLN
5.0000 mL | Freq: Three times a day (TID) | ORAL | 0 refills | Status: DC | PRN
  Filled 2022-03-08: qty 120, 8d supply, fill #0

## 2022-03-08 NOTE — Progress Notes (Signed)
Last herceptin dose 11/8. Per MD ok to continue current maint dose.

## 2022-03-08 NOTE — Progress Notes (Signed)
Symptom Management [place] at [facility] [phone] [phone]  Patient Care Team: Sindy Guadeloupe, MD as PCP - General (Oncology) Rico Junker, RN as Registered Nurse Theodore Demark, RN (Inactive) as Registered Nurse Sindy Guadeloupe, MD as Consulting Physician (Hematology and Oncology)   NAME OF PATIENT: Sophia [name]  [id number]  June 14, 1980   DATE OF VISIT: 03/08/22  REASON FOR CONSULT: Sophia [name] is a 41 y.o. 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 MRI of the brain on 02/08/2021 revealed widespread metastatic disease to the brain with mild associated edema and small area of hemorrhage in the right frontal.  Patient is status post whole brain radiation.  She completed Taxol and iwas on Herceptin and Perjeta maintenance.  She is now on trastuzumab with Xeloda and tucatinib.   INTERVAL HISTORY: Patient previously seen in [facility] on 02/28/2022 with URI symptoms.  Symptoms seemed distant with viral etiology.  Patient was recommended to obtain RSV/COVID testing and to hold Xeloda and tucatinib.  Patient was added on to clinic today.  She was seen in infusion.   Patient continues to endorse cough, worse at night.  Cough is intermittently productive with white phlegm.  She continues to endorse rhinorrhea with postnasal drip at night.  She also experiences shortness of breath, again particularly at night.  She was finding the albuterol helped but she has lost her inhaler.  She denies recent fever.  Last fever was on Thursday (6 days ago).  She denies other symptomatic complaints or concerns.  Her cold sore has resolved.   PAST MEDICAL HISTORY: Past Medical History:  Diagnosis Date   Anxiety    Breast cancer ([place])    with mets   Cancer ([place])    Colitis    COVID-19 in immunocompromised patient ([place])    Family history of colon  cancer    Vertigo     PAST SURGICAL HISTORY:  Past Surgical History:  Procedure Laterality Date   BREAST BIOPSY Left 08/14/2019   [place] bx of mass, coil marker, path pending   BREAST BIOPSY Left 08/14/2019   [place] bx of LN, hydromarker, path pending   BREAST BIOPSY Left 08/14/2019   affirm bx of calcs, x marker, path pending   ESOPHAGOGASTRODUODENOSCOPY (EGD) WITH PROPOFOL N/A 10/05/2019   Procedure: ESOPHAGOGASTRODUODENOSCOPY (EGD) WITH PROPOFOL;  Surgeon: Lin Landsman, MD;  Location: [place];  Service: Gastroenterology;  Laterality: N/A;   FLEXIBLE SIGMOIDOSCOPY N/A 10/05/2019   Procedure: FLEXIBLE SIGMOIDOSCOPY;  Surgeon: Lin Landsman, MD;  Location: [facility] ENDOSCOPY;  Service: Gastroenterology;  Laterality: N/A;   INTRAMEDULLARY (IM) NAIL INTERTROCHANTERIC Right 09/01/2019   Procedure: INTRAMEDULLARY (IM) NAIL INTERTROCHANTRIC AND RADIOFREQUENCY ABLATION;  Surgeon: Hessie Knows, MD;  Location: [facility] ORS;  Service: Orthopedics;  Laterality: Right;   KYPHOPLASTY N/A 08/29/2019   Procedure: KYPHOPLASTY T6, L1,L4 ,  RADIOFREQUENCY ABLATION;  Surgeon: Hessie Knows, MD;  Location: [facility] ORS;  Service: Orthopedics;  Laterality: N/A;   KYPHOPLASTY Right 09/01/2019   Procedure: Right Sacral Radiofrequency Ablation and Cement Augmentation, Right sacrum and iliac crest;  Surgeon: Hessie Knows, MD;  Location: [facility] ORS;  Service: Orthopedics;  Laterality: Right;   PORTA CATH INSERTION N/A 08/28/2019   Procedure: PORTA CATH INSERTION;  Surgeon: Algernon Huxley, MD;  Location: [place] CV LAB;  Service: Cardiovascular;  Laterality: N/A;    HEMATOLOGY/ONCOLOGY HISTORY:  Oncology History  Primary cancer of left  breast with metastasis to other site [facility])  08/28/2019 Initial Diagnosis   Primary cancer of left breast with metastasis to other site [facility])   01/21/2021 Cancer Staging   Staging form: Breast, AJCC 8th Edition - Clinical stage from 01/21/2021: Stage IV (cT2, cN1, cM1, G2, ER-,  PR-, HER2+) - Signed by Sindy Guadeloupe, MD on 01/21/2021 Histologic grading system: 3 grade system   Primary malignant neoplasm of breast with metastasis ([place])  08/29/2019 Initial Diagnosis   Metastatic breast cancer ([place])   09/04/2019 - 10/31/2021 Chemotherapy   Patient is on Treatment Plan : BREAST Weekly Paclitaxel + Trastuzumab + Pertuzumab q21d      Genetic Testing   Negative genetic testing. No pathogenic variants identified on the [facility] CancerNext-Expanded+RNA panel. The report date is 04/13/2020.   The CancerNext-Expanded + RNAinsight gene panel offered by [employer] and includes sequencing and rearrangement analysis for the following 77 genes: IP, ALK, APC*, ATM*, AXIN2, BAP1, BARD1, BLM, BMPR1A, BRCA1*, BRCA2*, BRIP1*, CDC73, CDH1*,CDK4, CDKN1B, CDKN2A, CHEK2*, CTNNA1, DICER1, FANCC, FH, FLCN, GALNT12, KIF1B, LZTR1, MAX, MEN1, MET, MLH1*, MSH2*, MSH3, MSH6*, MUTYH*, NBN, NF1*, NF2, NTHL1, PALB2*, PHOX2B, PMS2*, POT1, PRKAR1A, PTCH1, PTEN*, RAD51C*, RAD51D*,RB1, RECQL, RET, SDHA, SDHAF2, SDHB, SDHC, SDHD, SMAD4, SMARCA4, SMARCB1, SMARCE1, STK11, SUFU, TMEM127, TP53*,TSC1, TSC2, VHL and XRCC2 (sequencing and deletion/duplication); EGFR, EGLN1, HOXB13, KIT, MITF, PDGFRA, POLD1 and POLE (sequencing only); EPCAM and GREM1 (deletion/duplication only). DNA and RNA analyses performed for * genes.   11/18/2021 - 11/18/2021 Chemotherapy   Patient is on Treatment Plan : BREAST Trastuzumab (8/6) IV D1 + Capecitabine +  Tucatinib q21d     11/18/2021 - 12/09/2021 Chemotherapy   Patient is on Treatment Plan : BREAST Trastuzumab (8/6) IV D1 + Capecitabine +  Tucatinib q21d     12/26/2021 -  Chemotherapy   Patient is on Treatment Plan : BREAST Trastuzumab (8/6) IV D1 + Capecitabine +  Tucatinib q21d       ALLERGIES:  has No Known Allergies.  MEDICATIONS:  Current Outpatient Medications  Medication Sig Dispense Refill   albuterol (VENTOLIN HFA) 108 (90 Base) MCG/ACT inhaler Inhale 2 puffs into the  lungs every 6 (six) hours as needed for wheezing or shortness of breath. (Patient not taking: Reported on 11/18/2021) 8.5 g 2   benzoyl peroxide-erythromycin (BENZAMYCIN) gel Apply topically 2 (two) times daily. (Patient not taking: Reported on 09/09/2021) 46.6 g 0   butalbital-acetaminophen-caffeine (FIORICET) 50-325-40 MG tablet Take 1-2 tablets by mouth every 8 (eight) hours as needed for headache. 30 tablet 0   Calcium Carb-Cholecalciferol 600-10 MG-MCG TABS Take 1 tablet by mouth 2 (two) times daily. 180 tablet 0   capecitabine (XELODA) 500 MG tablet Take 3 tablets (1,500 mg total) by mouth 2 (two) times daily after a meal. Take for 14 days, then hold for 7 days. Repeat every 21 days. 84 tablet 1   dexamethasone (DECADRON) 4 MG tablet Take 2 tablets (8 mg total) by mouth 2 (two) times daily. 120 tablet 0   diphenoxylate-atropine (LOMOTIL) 2.5-0.025 MG tablet Take 1 tablet by mouth 4 (four) times daily as needed for diarrhea or loose stools. 40 tablet 0   DULoxetine (CYMBALTA) 60 MG capsule Take 1 capsule (60 mg total) by mouth daily. 30 capsule 3   gabapentin (NEURONTIN) 600 MG tablet Take 1 tablet (600 mg total) by mouth 3 (three) times daily. 90 tablet 3   lidocaine-prilocaine (EMLA) cream Apply 1 application topically as needed. Apply small amount to port site at least 1  hour prior to it being accessed, cover with plastic wrap 30 g 3   LORazepam (ATIVAN) 0.5 MG tablet Take 1 tablet (0.5 mg total) by mouth every 6 (six) hours as needed for anxiety (and nausea). AS NEEDED FOR NAUSEA 45 tablet 0   morphine (MS CONTIN) 15 MG 12 hr tablet Take 1 tablet (15 mg total) by mouth every 8 (eight) hours. 90 tablet 0   mupirocin ointment (BACTROBAN) 2 % Apply 1 Application topically 2 (two) times daily. 22 g 0   naloxone (NARCAN) nasal spray 4 mg/0.1 mL Administer one spray in nostril for opioid overdose. If unresponsive after 1 dose, may repeat a dose every 2-3 minutes in alternating nostrils until emergency  services arrive. 2 each 3   OLANZapine (ZYPREXA) 10 MG tablet Take 1 tablet (10 mg total) by mouth at bedtime as needed (nausea). 30 tablet 2   omeprazole (PRILOSEC) 20 MG capsule Take 1 capsule (20 mg total) by mouth daily. 30 capsule 3   ondansetron (ZOFRAN) 8 MG tablet Take 1 tablet (8 mg total) by mouth every 8 (eight) hours as needed for nausea or vomiting. 45 tablet 2   Oxycodone HCl 10 MG TABS Take 1/2 to 1 tablet (5-10 mg total) by mouth every 4 (four) hours as needed. 45 tablet 0   polyethylene glycol powder (MIRALAX) 17 GM/SCOOP powder Take 17 g by mouth daily as needed. 238 g 3   potassium chloride SA (KLOR-CON M) 20 MEQ tablet Take 1 tablet (20 mEq total) by mouth daily. 30 tablet 0   tucatinib (TUKYSA) 150 MG tablet Take 2 tablets (300 mg total) by mouth 2 (two) times daily. 120 tablet 3   valACYclovir (VALTREX) 1000 MG tablet Take 1 tablet (1,000 mg total) by mouth 2 (two) times daily. 14 tablet 0   No current facility-administered medications for this visit.   Facility-Administered Medications Ordered in Other Visits  Medication Dose Route Frequency Provider Last Rate Last Admin   0.9 %  sodium chloride infusion   Intravenous Continuous Jacquelin Hawking, NP   Stopped at 06/02/20 1453   heparin lock flush 100 unit/mL  500 Units Intravenous Once Sindy Guadeloupe, MD       heparin lock flush 100 unit/mL  500 Units Intravenous Once Sindy Guadeloupe, MD       heparin lock flush 100 unit/mL  500 Units Intracatheter Once PRN Sindy Guadeloupe, MD       sodium chloride flush (NS) 0.9 % injection 10 mL  10 mL Intravenous Once Sindy Guadeloupe, MD       sodium chloride flush (NS) 0.9 % injection 10 mL  10 mL Intravenous Once Sindy Guadeloupe, MD       trastuzumab-anns [facility]) 378 mg in sodium chloride 0.9 % 250 mL chemo infusion  6 mg/kg (Treatment Plan Recorded) Intravenous Once Sindy Guadeloupe, MD        VITAL SIGNS: There were no vitals taken for this visit. There were no vitals filed for  this visit.  Estimated body mass index is 25.38 kg/m as calculated from the following:   Height as of 02/15/22: _0  (1.575 m).   Weight as of an earlier encounter on 03/08/22: 138 lb 12.5 oz (62.9 kg).  LABS: CBC:    Component Value Date/Time   WBC 14.5 (H) 02/28/2022 1001   HGB 13.3 02/28/2022 1001   HCT 38.7 02/28/2022 1001   PLT 159 02/28/2022 1001   MCV 91.7 02/28/2022 1001  NEUTROABS 12.7 (H) 02/28/2022 1001   LYMPHSABS 0.7 02/28/2022 1001   MONOABS 0.7 02/28/2022 1001   EOSABS 0.2 02/28/2022 1001   BASOSABS 0.0 02/28/2022 1001   Comprehensive Metabolic Panel:    Component Value Date/Time   NA 136 02/28/2022 1001   K 3.6 02/28/2022 1001   CL 103 02/28/2022 1001   CO2 25 02/28/2022 1001   BUN 8 02/28/2022 1001   CREATININE 0.56 02/28/2022 1001   GLUCOSE 87 02/28/2022 1001   CALCIUM 7.9 (L) 02/28/2022 1001   AST 31 02/28/2022 1001   ALT 22 02/28/2022 1001   ALKPHOS 64 02/28/2022 1001   BILITOT 1.0 02/28/2022 1001   PROT 6.1 (L) 02/28/2022 1001   ALBUMIN 3.4 (L) 02/28/2022 1001    RADIOGRAPHIC STUDIES: MR Brain W Wo Contrast  Result Date: 02/20/2022 CLINICAL DATA:  Headache.  Brain metastases. EXAM: MRI HEAD WITHOUT AND WITH CONTRAST TECHNIQUE: Multiplanar, multiecho pulse sequences of the brain and surrounding structures were obtained without and with intravenous contrast. CONTRAST:  7.25m GADAVIST GADOBUTROL 1 MMOL/ML IV SOLN COMPARISON:  None Available. FINDINGS: Brain: No acute infarct, mass effect or extra-axial collection. No acute or chronic hemorrhage. There is multifocal hyperintense T2-weighted signal within the white matter. Generalized volume loss. Fluid hyperintense T2-weighted signal in the subcortical left frontal lobe. The numerous contrast-enhancing lesions of the brain have decreased in size. New lesions: None Larger lesions: None Unchanged or smaller lesions: 1. Left cerebellum, now 3 mm, image 35 2. Left cerebellum, 8 mm, image 42 3. Right  cerebellum 2 mm, image 44 4. Right cerebellum, 3 mm, image 46 5. Right temporal lobe, 4 mm, image 72 6. Right temporal lobe, 2 mm, image 75 7. Left frontal operculum, 13 mm, image 88 8. Posterior left parietal lobe, 4 mm, image 101 9. Right frontal operculum, 7 mm, image 102 10. Left frontal lobe, 2 mm, image 109 11. Left parietal lobe, 3 mm, images 113 and 117. The small foci were likely part of the larger lesion seen on series 18, image 106 of [id number] 12. Left frontal lobe, 2 mm, image 121 13. Anterior right frontal lobe, 7 mm, image 138 14. Superior left frontal lobe, 5 mm, image 142 Vascular: Major flow voids are preserved. Skull and upper cervical spine: Normal calvarium and skull base. Visualized upper cervical spine and soft tissues are normal. Sinuses/Orbits:No paranasal sinus fluid levels or advanced mucosal thickening. No mastoid or middle ear effusion. Normal orbits. IMPRESSION: Decreased size of numerous contrast-enhancing lesions of the brain, consistent with treatment response. No new lesions. Electronically Signed   By: KUlyses JarredM.D.   On: 02/20/2022 18:54   CT Head Wo Contrast  Result Date: 02/15/2022 CLINICAL DATA:  Metastatic breast cancer,, progressive headache. EXAM: CT HEAD WITHOUT CONTRAST TECHNIQUE: Contiguous axial images were obtained from the base of the skull through the vertex without intravenous contrast. RADIATION DOSE REDUCTION: This exam was performed according to the departmental dose-optimization program which includes automated exposure control, adjustment of the mA and/or kV according to patient size and/or use of iterative reconstruction technique. COMPARISON:  MRI 12/13/2021, CT head 02/05/2021 FINDINGS: Brain: Multiple cortical metastases better appreciated on prior MRI examination are not well visualized on this exam. Patchy subcortical white matter hypodensity, more severe within the anterior and high frontal lobes bilaterally corresponds to areas of increasing  vasogenic edema, better appreciated on MRI examination of 12/13/2021 in grossly unchanged from that examination. No significant mass effect or midline shift. No superimposed acute intracranial hemorrhage.  Punctate focus of calcification along the left frontal operculum is stable from prior MRI examination. Ventricular size is normal. Cerebellum is unremarkable. Vascular: No hyperdense vessel or unexpected calcification. Skull: Normal. Negative for fracture or focal lesion. Sinuses/Orbits: No acute finding. Other: Mastoid air cells and middle ear cavities are clear. IMPRESSION: 1. Multiple cortical metastases better appreciated on prior MRI examination are not well visualized on this exam. Patchy subcortical white matter hypodensity, more severe within the anterior and high frontal lobes bilaterally corresponds to areas of increasing vasogenic edema, better appreciated on MRI examination of 12/13/2021 and grossly unchanged from that examination. No significant mass effect or midline shift. No superimposed acute intracranial hemorrhage. Electronically Signed   By: Fidela Salisbury M.D.   On: 02/15/2022 19:02   ECHOCARDIOGRAM COMPLETE  Result Date: 02/08/2022    ECHOCARDIOGRAM REPORT   Patient Name:   LAURIAN EDRINGTON FEOFHQR Date of Exam: 02/08/2022 Medical Rec #:  [id number]             Height:       62.0 in Accession #:    [id number]            Weight:       141.0 lb Date of Birth:  11/13/1980             BSA:          1.648 m Patient Age:    33 years              BP:           Not listed in chart/Not                                                     listed in chart mmHg Patient Gender: F                     HR:           Not listed in chart bpm. Exam Location:  [facility] Procedure: 2D Echo, Cardiac Doppler, Color Doppler and Strain Analysis Indications:     Primary malignant neoplasm of breast with metastasis C50.919  History:         Patient has prior history of Echocardiogram examinations, most                   recent 10/28/2021. Anxiety, breast cancer.  Sonographer:     Sherrie Sport Referring Phys:  [id number] [facility] C RAO Diagnosing Phys: Kate Sable MD  Sonographer Comments: Global longitudinal strain was attempted. IMPRESSIONS  1. Left ventricular ejection fraction, by estimation, is 60 to 65%. The left ventricle has normal function. The left ventricle has no regional wall motion abnormalities. Left ventricular diastolic parameters were normal.  2. Right ventricular systolic function is normal. The right ventricular size is normal.  3. The mitral valve is normal in structure. No evidence of mitral valve regurgitation.  4. The aortic valve is tricuspid. Aortic valve regurgitation is not visualized. FINDINGS  Left Ventricle: Left ventricular ejection fraction, by estimation, is 60 to 65%. The left ventricle has normal function. The left ventricle has no regional wall motion abnormalities. Global longitudinal strain performed but not reported based on interpreter judgement due to suboptimal tracking. 3D left ventricular ejection fraction analysis performed but not reported based on interpreter judgement due to suboptimal  tracking. The left ventricular internal cavity size was normal in size. There is no left ventricular hypertrophy. Left ventricular diastolic parameters were normal. Right Ventricle: The right ventricular size is normal. No increase in right ventricular wall thickness. Right ventricular systolic function is normal. Left Atrium: Left atrial size was normal in size. Right Atrium: Right atrial size was normal in size. Pericardium: There is no evidence of pericardial effusion. Mitral Valve: The mitral valve is normal in structure. No evidence of mitral valve regurgitation. Tricuspid Valve: The tricuspid valve is normal in structure. Tricuspid valve regurgitation is trivial. Aortic Valve: The aortic valve is tricuspid. Aortic valve regurgitation is not visualized. Aortic valve mean gradient measures 3.0 mmHg.  Aortic valve peak gradient measures 5.0 mmHg. Aortic valve area, by VTI measures 3.05 cm. Pulmonic Valve: The pulmonic valve was not well visualized. Pulmonic valve regurgitation is trivial. Aorta: The aortic root is normal in size and structure. IAS/Shunts: No atrial level shunt detected by color flow Doppler.  LEFT VENTRICLE PLAX 2D LVIDd:         3.40 cm   Diastology LVIDs:         2.00 cm   LV e' medial:    8.27 cm/s LV PW:         1.00 cm   LV E/e' medial:  8.3 LV IVS:        1.10 cm   LV e' lateral:   12.30 cm/s LVOT diam:     2.00 cm   LV E/e' lateral: 5.6 LV SV:         51 LV SV Index:   31 LVOT Area:     3.14 cm                           3D Volume EF:                          3D EF:        56 %                          LV EDV:       109 ml                          LV ESV:       49 ml                          LV SV:        61 ml RIGHT VENTRICLE RV Basal diam:  3.10 cm RV Mid diam:    2.60 cm RV S prime:     11.40 cm/s TAPSE (M-mode): 1.8 cm LEFT ATRIUM           Index        RIGHT ATRIUM          Index LA diam:      2.30 cm 1.40 cm/m   RA Area:     7.04 cm LA Vol (A4C): 44.2 ml 26.82 ml/m  RA Volume:   11.00 ml 6.68 ml/m  AORTIC VALVE AV Area (Vmax):    2.35 cm AV Area (Vmean):   2.75 cm AV Area (VTI):     3.05 cm AV Vmax:           112.00 cm/s AV Vmean:  73.100 cm/s AV VTI:            0.168 m AV Peak Grad:      5.0 mmHg AV Mean Grad:      3.0 mmHg LVOT Vmax:         83.80 cm/s LVOT Vmean:        64.000 cm/s LVOT VTI:          0.163 m LVOT/AV VTI ratio: 0.97  AORTA Ao Root diam: 2.63 cm MITRAL VALVE               TRICUSPID VALVE MV Area (PHT): 3.91 cm    TR Peak grad:   19.0 mmHg MV Decel Time: 194 msec    TR Vmax:        218.00 cm/s MV E velocity: 68.70 cm/s MV A velocity: 71.80 cm/s  SHUNTS MV E/A ratio:  0.96        Systemic VTI:  0.16 m                            Systemic Diam: 2.00 cm Kate Sable MD Electronically signed by Kate Sable MD Signature Date/Time:  02/08/2022/3:26:23 PM    Final     PERFORMANCE STATUS (ECOG) : 2 - Symptomatic, <50% confined to bed  Review of Systems Unless otherwise noted, a complete review of systems is negative.  Physical Exam General: NAD Cardiovascular: regular rate and rhythm Pulmonary: clear ant fields, cough Abdomen: soft, nontender, + bowel sounds GU: no suprapubic tenderness Extremities: no edema, no joint deformities Skin: no rashes Neurological: Weakness but otherwise nonfocal  IMPRESSION/PLAN: URI -patient was unable to get tested for COVID/RSV.  She is now outside an effective window for treatment for COVID.  Given that she is immunocompromised and still symptomatic a week later, we will start her on antibiotics empirically.  Will refill inhaler and prescribe antitussive.  Continue supportive care.   Patient expressed understanding and was in agreement with this plan. She also understands that She can call clinic at any time with any questions, concerns, or complaints.   Thank you for allowing me to participate in the care of this very pleasant patient.   Time Total: 20 minutes  Visit consisted of counseling and education dealing with the complex and emotionally intense issues of symptom management in the setting of serious illness.Greater than 50%  of this time was spent counseling and coordinating care related to the above assessment and plan.  Signed by: Altha Harm, PhD, NP-C

## 2022-03-08 NOTE — Patient Instructions (Signed)
Instrucciones al darle de alta: Discharge Instructions Gracias por elegir al [facility] de Cncer de [place] para brindarle atencin mdica de oncologa y Music therapist.   Si usted tiene una cita de laboratorio con [place], por favor vaya directamente [place] y regstrese en el rea de Control and instrumentation engineer.   Use ropa cmoda y [place] para tener fcil acceso a las vas del Portacath (acceso venoso de Engineer, site duracin) o la lnea PICC (catter central colocado por va perifrica).   Nos esforzamos por ofrecerle tiempo de calidad con su proveedor. Es posible que tenga que volver a programar su cita si llega tarde (15 minutos o ms).  El llegar tarde le afecta a usted y a otros pacientes cuyas citas son posteriores a Merchandiser, retail.  Adems, si usted falta a tres o ms citas sin avisar a la oficina, puede ser retirado(a) de la clnica a discrecin del proveedor.      Para las solicitudes de renovacin de recetas, pida a su farmacia que se ponga en contacto con nuestra oficina y deje que transcurran 40 horas para que se complete el proceso de las renovaciones.    Hoy usted recibi los siguientes agentes de quimioterapia e/o inmunoterapia Kanjinti      Para ayudar a prevenir las nuseas y los vmitos despus de su tratamiento, le recomendamos que tome su medicamento para las nuseas segn las indicaciones.  LOS SNTOMAS QUE DEBEN COMUNICARSE INMEDIATAMENTE SE INDICAN A CONTINUACIN: *FIEBRE SUPERIOR A 100.4 F (38 C) O MS *ESCALOFROS O SUDORACIN *NUSEAS Y VMITOS QUE NO SE CONTROLAN CON EL MEDICAMENTO PARA LAS NUSEAS *DIFICULTAD INUSUAL PARA RESPIRAR  *MORETONES O HEMORRAGIAS NO HABITUALES *PROBLEMAS URINARIOS (dolor o ardor al Garment/textile technologist o frecuencia para Garment/textile technologist) *PROBLEMAS INTESTINALES (diarrea inusual, estreimiento, dolor cerca del ano) SENSIBILIDAD EN LA BOCA Y EN LA GARGANTA CON O SIN LA PRESENCIA DE LCERAS (dolor de garganta, llagas en la boca o dolor de muelas/dientes) ERUPCIN,  HINCHAZN O DOLORES INUSUALES FLUJO VAGINAL INUSUAL O PICAZN/RASQUIA    Los puntos marcados con un asterisco ( *) indican una posible emergencia y debe hacer un seguimiento tan pronto como le sea posible o vaya al Departamento de Emergencias si se le presenta algn problema.  Por favor, muestre la [place] DE ADVERTENCIA DE Windy Canny DE ADVERTENCIA DE Benay Spice al registrarse en [address] de Emergencias y a la enfermera de triaje.  Si tiene preguntas despus de su visita o necesita cancelar o volver a programar su cita, por favor pngase en contacto con [facility] CANCER CTR AT [place]-MEDICAL ONCOLOGY  [phone]  y [place] instrucciones. Las horas de oficina son de 8:00 a.m. a 4:30 p.m. de lunes a viernes. Por favor, tenga en cuenta que los mensajes de voz que se dejan despus de las 4:00 p.m. posiblemente no se devolvern hasta el siguiente da de [place].  Cerramos los fines de semana y [employer]. En todo momento tiene acceso a una enfermera para preguntas urgentes. Por favor, llame al nmero principal de la clnica  [phone] y [place] instrucciones.   Para cualquier pregunta que no sea de carcter urgente, tambin puede ponerse en contacto con su proveedor [employer]. Ahora ofrecemos visitas electrnicas para cualquier persona mayor de 18 aos que solicite atencin mdica en lnea para los sntomas que no sean urgentes. Para ms detalles vaya a mychart.[email or web address].   Tambin puede bajar la aplicacin de MyChart! Vaya a la tienda de aplicaciones, busque "MyChart", abra la aplicacin, seleccione  [place], e ingrese con su nombre de usuario y la contrasea de Pharmacist, community.  Las mscaras son opcionales en los centros de Hotel manager. Si desea que su equipo de cuidados mdicos use una [employer] atienden, por favor hgaselo saber al personal. Bethann Berks[place] una persona de apoyo que tenga por lo menos 16 aos para que le acompae a sus  citas.

## 2022-03-09 ENCOUNTER — Telehealth: Payer: Self-pay | Admitting: Pharmacist

## 2022-03-09 NOTE — Telephone Encounter (Signed)
Received call from [place] to set-up delivery of patient tucatinib. Expected delivery date on 03/16/22. Medication will be delivered to the cancer center to be given to patient due to house issues.

## 2022-03-15 ENCOUNTER — Other Ambulatory Visit (HOSPITAL_COMMUNITY): Payer: Self-pay

## 2022-03-15 ENCOUNTER — Encounter: Payer: Self-pay | Admitting: Oncology

## 2022-03-15 ENCOUNTER — Other Ambulatory Visit: Payer: Self-pay

## 2022-03-16 ENCOUNTER — Other Ambulatory Visit: Payer: Self-pay

## 2022-03-17 ENCOUNTER — Encounter: Payer: Self-pay | Admitting: Oncology

## 2022-03-17 ENCOUNTER — Other Ambulatory Visit: Payer: Self-pay | Admitting: *Deleted

## 2022-03-17 ENCOUNTER — Other Ambulatory Visit: Payer: Self-pay

## 2022-03-17 MED ORDER — OXYCODONE HCL 10 MG PO TABS
5.0000 mg | ORAL_TABLET | ORAL | 0 refills | Status: DC | PRN
  Filled 2022-03-17: qty 45, 8d supply, fill #0

## 2022-03-21 ENCOUNTER — Ambulatory Visit: Payer: Self-pay

## 2022-03-21 ENCOUNTER — Encounter: Payer: Self-pay | Admitting: Oncology

## 2022-03-21 ENCOUNTER — Ambulatory Visit: Payer: Self-pay | Admitting: Oncology

## 2022-03-21 ENCOUNTER — Other Ambulatory Visit: Payer: Self-pay

## 2022-03-24 IMAGING — CR DG FEMUR 2+V*R*
1 series · 4 of 4 positions shown · non-contrast
Comparison: [DATE]

CLINICAL DATA: Fall, pain

EXAM:
RIGHT FEMUR 2 VIEWS

[Series 1: dg femur, min 2 views right · 0.14mm/px · 4 of 4 slices shown]
[im 1/4]
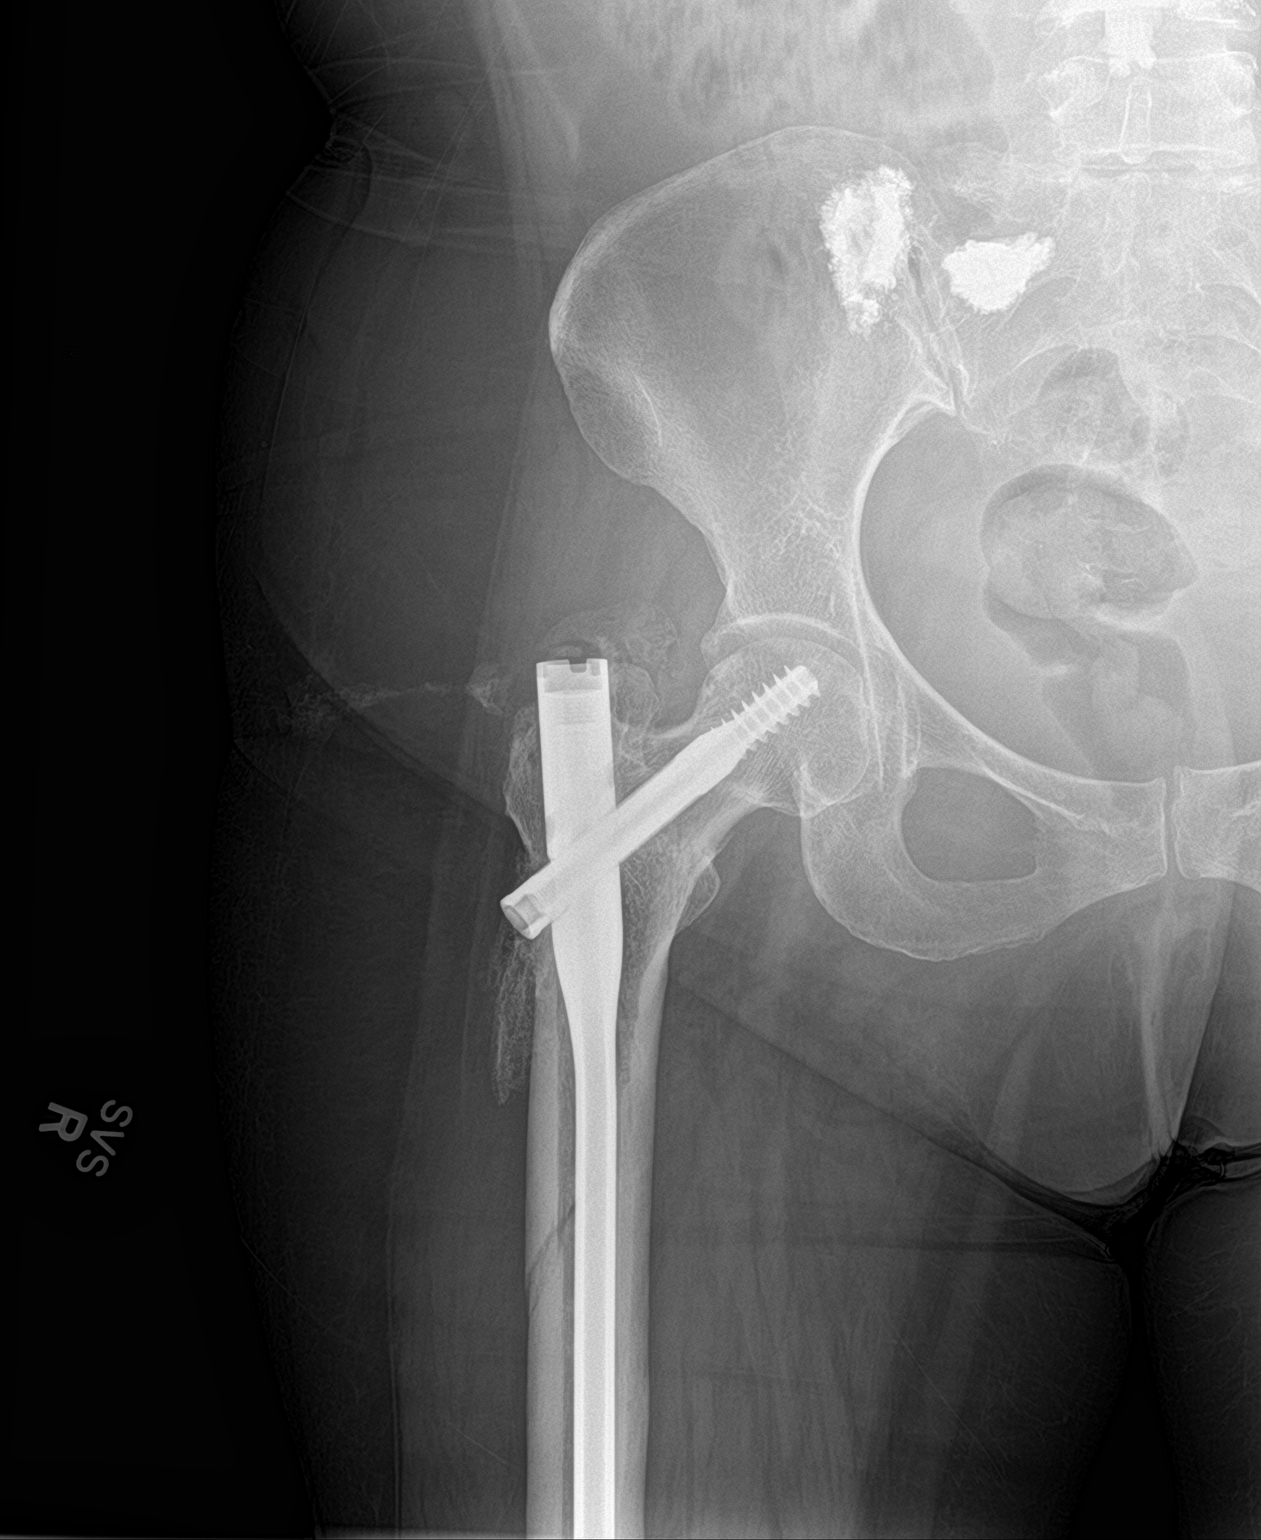
[im 2/4]
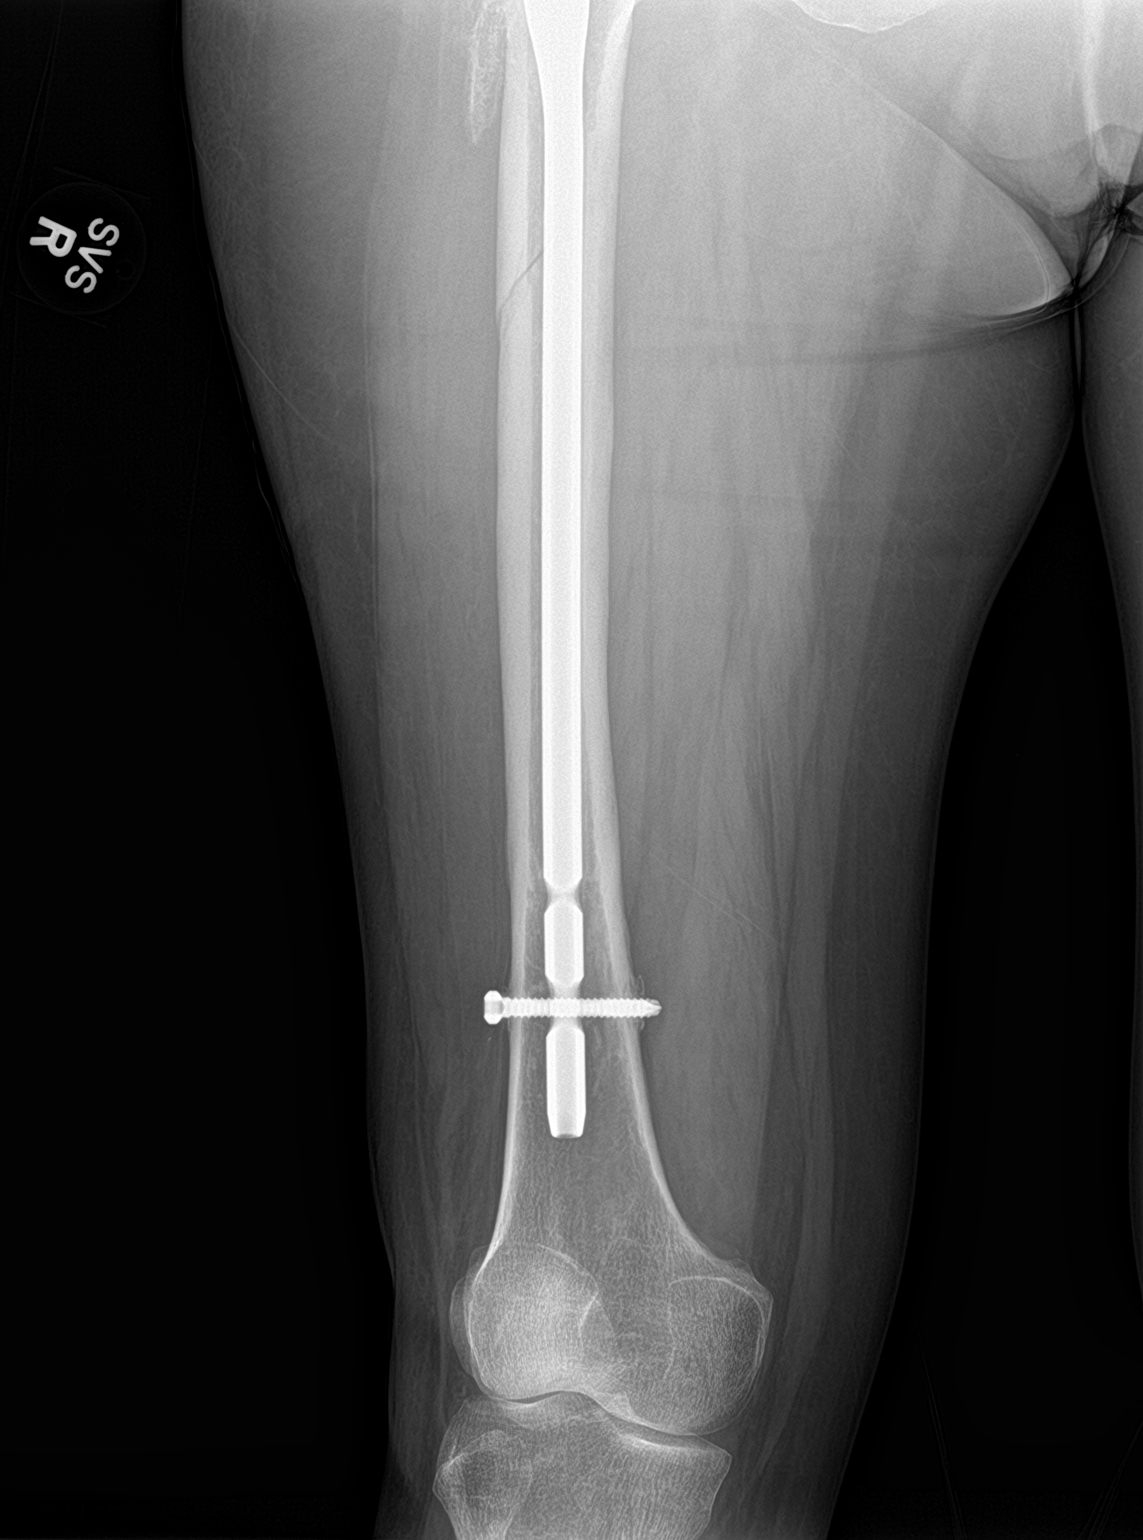
[im 3/4]
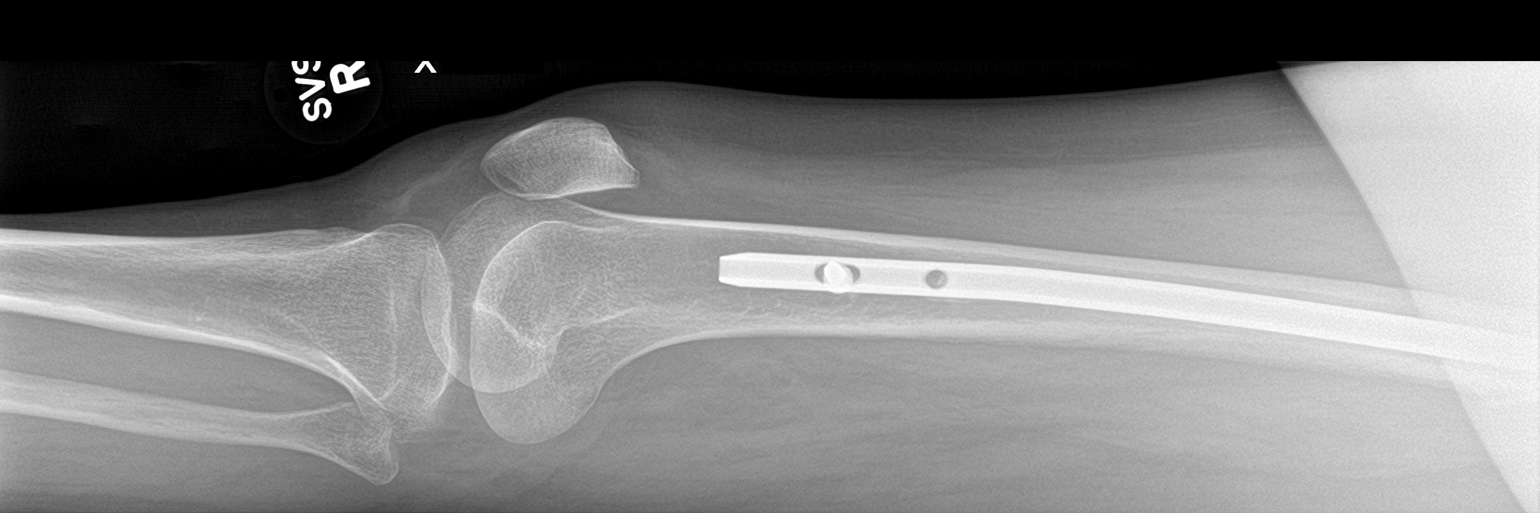
[im 4/4]
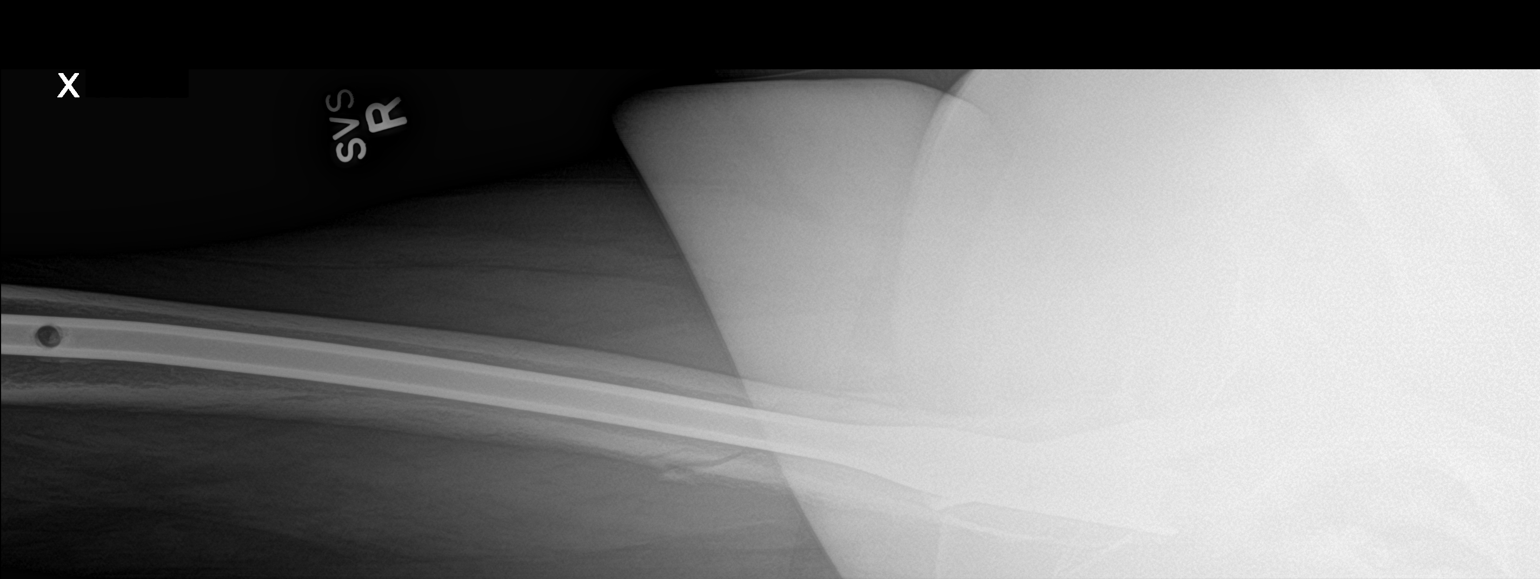

[4 of 4 positions shown; findings below may reference images not displayed]

FINDINGS: Evidence of prior trauma and internal fixation. There is an acute
nondisplaced fracture through the lateral cortex in the proximal to
mid shaft of the right femur. No displacement. No subluxation or
dislocation.
IMPRESSION: Lateral cortical periprosthetic acute fracture adjacent to the IM
nail in the proximal to mid right femur.

## 2022-03-28 ENCOUNTER — Ambulatory Visit: Payer: Self-pay

## 2022-03-28 ENCOUNTER — Ambulatory Visit: Payer: Self-pay | Admitting: Oncology

## 2022-03-30 ENCOUNTER — Telehealth: Payer: Self-pay | Admitting: Pharmacist

## 2022-03-30 NOTE — Telephone Encounter (Signed)
Received call from [place] to set-up delivery of patient tucatinib. Expected delivery date on 04/06/22. Medication will be delivered to the cancer center to be given to patient due to housing issues.

## 2022-03-31 ENCOUNTER — Other Ambulatory Visit: Payer: Self-pay

## 2022-03-31 ENCOUNTER — Inpatient Hospital Stay: Payer: Self-pay | Attending: Oncology | Admitting: Oncology

## 2022-03-31 ENCOUNTER — Encounter: Payer: Self-pay | Admitting: Oncology

## 2022-03-31 ENCOUNTER — Inpatient Hospital Stay: Payer: Self-pay

## 2022-03-31 ENCOUNTER — Other Ambulatory Visit (HOSPITAL_COMMUNITY): Payer: Self-pay

## 2022-03-31 ENCOUNTER — Other Ambulatory Visit: Payer: Self-pay | Admitting: *Deleted

## 2022-03-31 VITALS — BP 103/63 | HR 70 | Temp 98.6°F | Resp 15

## 2022-03-31 DIAGNOSIS — M8440XA Pathological fracture, unspecified site, initial encounter for fracture: Secondary | ICD-10-CM

## 2022-03-31 DIAGNOSIS — C50912 Malignant neoplasm of unspecified site of left female breast: Secondary | ICD-10-CM | POA: Insufficient documentation

## 2022-03-31 DIAGNOSIS — C7931 Secondary malignant neoplasm of brain: Secondary | ICD-10-CM | POA: Insufficient documentation

## 2022-03-31 DIAGNOSIS — C7951 Secondary malignant neoplasm of bone: Secondary | ICD-10-CM | POA: Insufficient documentation

## 2022-03-31 DIAGNOSIS — Z5112 Encounter for antineoplastic immunotherapy: Secondary | ICD-10-CM

## 2022-03-31 DIAGNOSIS — C50919 Malignant neoplasm of unspecified site of unspecified female breast: Secondary | ICD-10-CM

## 2022-03-31 DIAGNOSIS — Z79899 Other long term (current) drug therapy: Secondary | ICD-10-CM

## 2022-03-31 DIAGNOSIS — Z171 Estrogen receptor negative status [ER-]: Secondary | ICD-10-CM | POA: Insufficient documentation

## 2022-03-31 MED ORDER — DIPHENHYDRAMINE HCL 25 MG PO CAPS
50.0000 mg | ORAL_CAPSULE | Freq: Once | ORAL | Status: AC
  Administered 2022-03-31: 50 mg via ORAL
  Filled 2022-03-31: qty 2

## 2022-03-31 MED ORDER — CALCIUM CARB-CHOLECALCIFEROL 600-10 MG-MCG PO TABS
1.0000 | ORAL_TABLET | Freq: Two times a day (BID) | ORAL | 0 refills | Status: DC
  Filled 2022-03-31: qty 180, 90d supply, fill #0

## 2022-03-31 MED ORDER — BUTALBITAL-APAP-CAFFEINE 50-325-40 MG PO TABS: 1.0000 | ORAL_TABLET | Freq: Three times a day (TID) | ORAL | 1 refills | Status: DC | PRN

## 2022-03-31 MED ORDER — SODIUM CHLORIDE 0.9 % IV SOLN
Freq: Once | INTRAVENOUS | Status: AC
  Filled 2022-03-31: qty 250

## 2022-03-31 MED ORDER — TRASTUZUMAB-ANNS CHEMO 150 MG IV SOLR
6.0000 mg/kg | Freq: Once | INTRAVENOUS | Status: AC
  Administered 2022-03-31: 378 mg via INTRAVENOUS
  Filled 2022-03-31: qty 18

## 2022-03-31 MED ORDER — HEPARIN SOD (PORK) LOCK FLUSH 100 UNIT/ML IV SOLN
500.0000 [IU] | Freq: Once | INTRAVENOUS | Status: AC | PRN
  Administered 2022-03-31: 500 [IU]
  Filled 2022-03-31: qty 5

## 2022-03-31 MED ORDER — ACETAMINOPHEN 325 MG PO TABS: 650.0000 mg | ORAL_TABLET | Freq: Once | ORAL | Status: DC

## 2022-03-31 MED ORDER — TRASTUZUMAB-ANNS CHEMO 150 MG IV SOLR: 6.0000 mg/kg | Freq: Once | INTRAVENOUS | Status: DC

## 2022-03-31 MED ORDER — SODIUM CHLORIDE 0.9% FLUSH
10.0000 mL | INTRAVENOUS | Status: DC | PRN
  Filled 2022-03-31: qty 10

## 2022-03-31 MED ORDER — ACETAMINOPHEN 325 MG PO TABS
650.0000 mg | ORAL_TABLET | Freq: Once | ORAL | Status: AC
  Administered 2022-03-31: 650 mg via ORAL
  Filled 2022-03-31: qty 2

## 2022-03-31 MED ORDER — DIPHENHYDRAMINE HCL 25 MG PO CAPS: 50.0000 mg | ORAL_CAPSULE | Freq: Once | ORAL | Status: DC

## 2022-03-31 NOTE — Progress Notes (Signed)
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  History obtained with the help of an interpreter.Patient self palpated left breast mass which was followed by a diagnostic bilateral mammogram.  Mammogram showed 3.1 x 2.9 x 1.9 cm hypoechoic mass at the 1 o'clock position of the left breast.  For abnormal cortically thickened left axillary lymph nodes measuring up to 5 mm.  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  Patient was also having ongoing back pain and was seen by [facility] orthopedics Dr. Doyle Askew who ordered MRI lumbar spine without contrast which showed possible pathologic fractures of L1 and L4 vertebral bodies with greater than 50% height loss at L1 and abnormal signal involving L2-L3 S1 as well as right iliac bone concerning for metastatic disease.  Patient is a single mother of 3 adult children and is very anxious today.  She reports significant back pain which radiates to her bilateral thighs.  Denies any focal tingling numbness or weakness.  Denies any bowel bladder incontinence.  Pain has been uncontrolled despite taking Tylenol.  No prior history of abnormal breast biopsies.  No family history of breast  cancer   PET and MRI showed 3 areas of pathologic fracture of her spine as well as widespread bony metastatic disease and concern for impending fracture of the right hip.  Given her worsening pain she was asked to come to the ER.  She has been evaluated by Dr. Rudene Christians from orthopedic surgery and underwent kyphoplasty at 3 different levels. T6 L1 and L4 along with radiofrequency ablation.    She also underwent prophylactic fixation of the right hip and not affected the sacral region.     Patient received first dose of Herceptin and Perjeta on 09/04/2019.  Patient initially received Taxol and Herceptin perjeta.  Presently patient is on maintenance Herceptin and Perjeta alone.  Patient found to have brain mets in November 2022 for which she received whole brain radiation treatment but there was no evidence of systemic progression.  She was continued on Herceptin and Perjeta.  Repeat MRI in June 2023 showed slight progression of brain mets.  Patient was switched to Herceptin tucatinib Xeloda    Interval history-patient reports occasional dizziness.  Also has difficulty remembering things at times.  She has chronic dull aching pain in her right lower extremity and feels cold in both her extremities.  Mild self-limited constipation.  ECOG PS- 1 Pain scale- 2   Review of systems- Review of Systems  Constitutional:  Positive for malaise/fatigue. Negative for chills, fever and weight loss.  HENT:  Negative for congestion, ear discharge and nosebleeds.   Eyes:  Negative for blurred vision.  Respiratory:  Negative for cough, hemoptysis, sputum production, shortness of breath and wheezing.   Cardiovascular:  Negative for chest pain, palpitations, orthopnea and claudication.  Gastrointestinal:  Positive for constipation. Negative for abdominal pain, blood in stool, diarrhea, heartburn, melena, nausea and vomiting.  Genitourinary:  Negative for dysuria, flank pain, frequency, hematuria and urgency.   Musculoskeletal:  Negative for back pain, joint pain and myalgias.  Skin:  Negative for rash.  Neurological:  Positive for dizziness. Negative for tingling, focal weakness, seizures, weakness and headaches.  Endo/Heme/Allergies:  Does not bruise/bleed easily.  Psychiatric/Behavioral:  Negative for depression and suicidal ideas. The patient does not have insomnia.       No Known Allergies   Past Medical History:  Diagnosis Date   Anxiety    Breast cancer ([place])    with mets   Cancer ([place])    Colitis    COVID-19 in immunocompromised patient ([place])    Family history of colon cancer    Vertigo      Past Surgical History:  Procedure Laterality Date   BREAST BIOPSY Left 08/14/2019   [place] bx of mass, coil marker, path pending   BREAST BIOPSY Left 08/14/2019   [place] bx of LN, hydromarker, path pending   BREAST BIOPSY Left 08/14/2019   affirm bx of calcs, x marker, path pending   ESOPHAGOGASTRODUODENOSCOPY (EGD) WITH PROPOFOL N/A 10/05/2019   Procedure: ESOPHAGOGASTRODUODENOSCOPY (EGD) WITH PROPOFOL;  Surgeon: Lin Landsman, MD;  Location: [place];  Service: Gastroenterology;  Laterality: N/A;   FLEXIBLE SIGMOIDOSCOPY N/A 10/05/2019   Procedure: FLEXIBLE SIGMOIDOSCOPY;  Surgeon: Lin Landsman, MD;  Location: [facility] ENDOSCOPY;  Service: Gastroenterology;  Laterality: N/A;   INTRAMEDULLARY (IM) NAIL INTERTROCHANTERIC Right 09/01/2019   Procedure: INTRAMEDULLARY (IM) NAIL INTERTROCHANTRIC AND RADIOFREQUENCY ABLATION;  Surgeon: Hessie Knows, MD;  Location: [facility] ORS;  Service: Orthopedics;  Laterality: Right;   KYPHOPLASTY N/A 08/29/2019   Procedure: KYPHOPLASTY T6, L1,L4 ,  RADIOFREQUENCY ABLATION;  Surgeon: Hessie Knows, MD;  Location: [facility] ORS;  Service: Orthopedics;  Laterality: N/A;   KYPHOPLASTY Right 09/01/2019   Procedure: Right Sacral Radiofrequency Ablation and Cement Augmentation, Right sacrum and iliac crest;  Surgeon: Hessie Knows, MD;  Location: [facility] ORS;  Service:  Orthopedics;  Laterality: Right;   PORTA CATH INSERTION N/A 08/28/2019   Procedure: PORTA CATH INSERTION;  Surgeon: Algernon Huxley, MD;  Location: [place] CV LAB;  Service: Cardiovascular;  Laterality: N/A;    Social History   Socioeconomic History   Marital status: Single    Spouse name: Not on file   Number of children: Not on file   Years of education: Not on file   Highest education level: Not on file  Occupational History   Not on file  Tobacco Use   Smoking status: Never   Smokeless tobacco: Never  Vaping Use   Vaping Use: Never used  Substance and Sexual Activity   Alcohol use: Not Currently   Drug use: Not Currently   Sexual activity: Not Currently    Birth control/protection: None  Other Topics Concern   Not on file  Social History Narrative   Lives at home with children   Social Determinants of Health   Financial Resource Strain: High Risk (11/18/2021)   Overall Financial Resource Strain (CARDIA)    Difficulty of Paying Living Expenses: Very hard  Food Insecurity: Food Insecurity Present (12/26/2021)   Hunger Vital Sign    Worried About Running Out of Food in the Last Year: Often true    Ran Out of Food in the Last Year: Often true  Transportation Needs: Unmet  Transportation Needs (12/26/2021)   PRAPARE - Hydrologist (Medical): Yes    Lack of Transportation (Non-Medical): Yes  Physical Activity: Inactive (11/18/2021)   Exercise Vital Sign    Days of Exercise per Week: 0 days    Minutes of Exercise per Session: 0 min  Stress: Stress Concern Present (11/18/2021)   [place]    Feeling of Stress : Very much  Social Connections: Socially Isolated (11/18/2021)   Social Connection and Isolation Panel [NHANES]    Frequency of Communication with Friends and Family: Three times a week    Frequency of Social Gatherings with Friends and Family: Three times a week    Attends  Religious Services: Never    Active Member of Clubs or Organizations: No    Attends Archivist Meetings: Never    Marital Status: Never married  Intimate Partner Violence: Not At Risk (11/18/2021)   Humiliation, Afraid, Rape, and Kick questionnaire    Fear of Current or Ex-Partner: No    Emotionally Abused: No    Physically Abused: No    Sexually Abused: No    Family History  Problem Relation Age of Onset   Colon cancer Maternal Uncle      Current Outpatient Medications:    albuterol (VENTOLIN HFA) 108 (90 Base) MCG/ACT inhaler, Inhale 2 puffs into the lungs every 6 (six) hours as needed for wheezing or shortness of breath., Disp: 8.5 g, Rfl: 2   capecitabine (XELODA) 500 MG tablet, Take 3 tablets (1,500 mg total) by mouth 2 (two) times daily after a meal. Take for 14 days, then hold for 7 days. Repeat every 21 days., Disp: 84 tablet, Rfl: 1   dexamethasone (DECADRON) 4 MG tablet, Take 2 tablets (8 mg total) by mouth 2 (two) times daily., Disp: 120 tablet, Rfl: 0   gabapentin (NEURONTIN) 600 MG tablet, Take 1 tablet (600 mg total) by mouth 3 (three) times daily., Disp: 90 tablet, Rfl: 3   lidocaine-prilocaine (EMLA) cream, Apply 1 application topically as needed. Apply small amount to port site at least 1 hour prior to it being accessed, cover with plastic wrap, Disp: 30 g, Rfl: 3   LORazepam (ATIVAN) 0.5 MG tablet, Take 1 tablet (0.5 mg total) by mouth every 6 (six) hours as needed for anxiety (and nausea). AS NEEDED FOR NAUSEA, Disp: 45 tablet, Rfl: 0   morphine (MS CONTIN) 15 MG 12 hr tablet, Take 1 tablet (15 mg total) by mouth every 8 (eight) hours., Disp: 90 tablet, Rfl: 0   OLANZapine (ZYPREXA) 10 MG tablet, Take 1 tablet (10 mg total) by mouth at bedtime as needed (nausea)., Disp: 30 tablet, Rfl: 2   ondansetron (ZOFRAN) 8 MG tablet, Take 1 tablet (8 mg total) by mouth every 8 (eight) hours as needed for nausea or vomiting., Disp: 45 tablet, Rfl: 2   Oxycodone HCl 10 MG  TABS, Take 1/2 to 1 tablet (5-10 mg total) by mouth every 4 (four) hours as needed., Disp: 45 tablet, Rfl: 0   polyethylene glycol powder (MIRALAX) 17 GM/SCOOP powder, Take 17 g by mouth daily as needed., Disp: 238 g, Rfl: 3   tucatinib (TUKYSA) 150 MG tablet, Take 2 tablets (300 mg total) by mouth 2 (two) times daily., Disp: 120 tablet, Rfl: 3   benzoyl peroxide-erythromycin (BENZAMYCIN) gel, Apply topically 2 (two) times daily. (Patient not taking: Reported on 09/09/2021), Disp: 46.6 g, Rfl: 0   butalbital-acetaminophen-caffeine (FIORICET)  50-325-40 MG tablet, Take 1-2 tablets by mouth every 8 (eight) hours as needed for headache., Disp: 30 tablet, Rfl: 1   Calcium Carb-Cholecalciferol 600-10 MG-MCG TABS, Take 1 tablet by mouth 2 (two) times daily., Disp: 180 tablet, Rfl: 0   diphenoxylate-atropine (LOMOTIL) 2.5-0.025 MG tablet, Take 1 tablet by mouth 4 (four) times daily as needed for diarrhea or loose stools. (Patient not taking: Reported on 03/31/2022), Disp: 40 tablet, Rfl: 0   DULoxetine (CYMBALTA) 60 MG capsule, Take 1 capsule (60 mg total) by mouth daily., Disp: 30 capsule, Rfl: 3   naloxone (NARCAN) nasal spray 4 mg/0.1 mL, Administer one spray in nostril for opioid overdose. If unresponsive after 1 dose, may repeat a dose every 2-3 minutes in alternating nostrils until emergency services arrive. (Patient not taking: Reported on 03/31/2022), Disp: 2 each, Rfl: 3   omeprazole (PRILOSEC) 20 MG capsule, Take 1 capsule (20 mg total) by mouth daily., Disp: 30 capsule, Rfl: 3  Current Facility-Administered Medications:    sodium chloride flush (NS) 0.9 % injection 10 mL, 10 mL, Intracatheter, PRN, Sindy Guadeloupe, MD  Facility-Administered Medications Ordered in Other Visits:    0.9 %  sodium chloride infusion, , Intravenous, Continuous, Burns, Wandra Feinstein, NP, Stopped at 06/02/20 1453   heparin lock flush 100 unit/mL, 500 Units, Intravenous, Once, Sindy Guadeloupe, MD   heparin lock flush 100 unit/mL,  500 Units, Intravenous, Once, Sindy Guadeloupe, MD   sodium chloride flush (NS) 0.9 % injection 10 mL, 10 mL, Intravenous, Once, Sindy Guadeloupe, MD   sodium chloride flush (NS) 0.9 % injection 10 mL, 10 mL, Intravenous, Once, Sindy Guadeloupe, MD  Physical exam:  Vitals:   03/31/22 1032  BP: 103/63  Pulse: 70  Resp: 15  Temp: 98.6 F (37 C)  TempSrc: Tympanic  SpO2: 100%   Physical Exam Cardiovascular:     Rate and Rhythm: Normal rate and regular rhythm.     Heart sounds: Normal heart sounds.  Pulmonary:     Effort: Pulmonary effort is normal.     Breath sounds: Normal breath sounds.  Abdominal:     General: Bowel sounds are normal.     Palpations: Abdomen is soft.  Skin:    General: Skin is warm and dry.  Neurological:     Mental Status: She is alert and oriented to person, place, and time.         Latest Ref Rng & Units 02/28/2022   10:01 AM  CMP  Glucose 70 - 99 mg/dL 87   BUN 6 - 20 mg/dL 8   Creatinine 0.44 - 1.00 mg/dL 0.56   Sodium 135 - 145 mmol/L 136   Potassium 3.5 - 5.1 mmol/L 3.6   Chloride 98 - 111 mmol/L 103   CO2 22 - 32 mmol/L 25   Calcium 8.9 - 10.3 mg/dL 7.9   Total Protein 6.5 - 8.1 g/dL 6.1   Total Bilirubin 0.3 - 1.2 mg/dL 1.0   Alkaline Phos 38 - 126 U/L 64   AST 15 - 41 U/L 31   ALT 0 - 44 U/L 22       Latest Ref Rng & Units 02/28/2022   10:01 AM  CBC  WBC 4.0 - 10.5 K/uL 14.5   Hemoglobin 12.0 - 15.0 g/dL 13.3   Hematocrit 36.0 - 46.0 % 38.7   Platelets 150 - 400 K/uL 159      Assessment and plan- Patient is a 42 y.o. 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  She is here for on treatment assessment prior to next cycle of Herceptin  Patient has been getting labs every 6 weeks.  Okay to proceed with Herceptin today.  She started her next cycle of Xeloda today.  She will also continue to coordinate until progression or toxicity.  We have given her new supplies for Xeloda  and Tucatinib today.  She will directly proceed for Herceptin in 3 weeks and I will see her back in 6 weeks.  I will obtain MRI brain with and without contrast, CT chest abdomen and pelvis with contrast bone scan as well as an echocardiogram prior.  I suspect patient's word finding difficulty and occasional dizziness could be secondary to whole brain radiation which she has received twice now.  Her MRI brain in December 2023Showed decrease in the size of brain metastases after radiation therapy.  I am planning to get a repeat MRI in 5 weeks but I will consider getting it sooner if her neurological symptoms worsen.  Neoplasm related pain: Continue as needed oxycodone  Bone mets: Delton See has been on hold because of hypocalcemia.  I will consider giving it to her in 3 weeks if her calcium levels are better.  I have encouraged her to continue oral calcium   Visit Diagnosis 1. Primary malignant neoplasm of breast with metastasis ([place])   2. Multiple pathological fractures, initial encounter   3. High risk medication use   4. Encounter for monoclonal antibody treatment for malignancy      Dr. Randa Evens, MD, MPH [facility] at [facility] [phone] 03/31/2022 12:39 PM

## 2022-03-31 NOTE — Progress Notes (Signed)
Pt has had some forgetfulness, ringing in ears, and sometime dizzy. None of this today .

## 2022-03-31 NOTE — Patient Instructions (Signed)
Instrucciones al darle de alta: Discharge Instructions Gracias por elegir al [facility] de Cncer de [place] para brindarle atencin mdica de oncologa y Music therapist.   Si usted tiene una cita de laboratorio con [place], por favor vaya directamente [place] y regstrese en el rea de Control and instrumentation engineer.   Use ropa cmoda y [place] para tener fcil acceso a las vas del Portacath (acceso venoso de Engineer, site duracin) o la lnea PICC (catter central colocado por va perifrica).   Nos esforzamos por ofrecerle tiempo de calidad con su proveedor. Es posible que tenga que volver a programar su cita si llega tarde (15 minutos o ms).  El llegar tarde le afecta a usted y a otros pacientes cuyas citas son posteriores a Merchandiser, retail.  Adems, si usted falta a tres o ms citas sin avisar a la oficina, puede ser retirado(a) de la clnica a discrecin del proveedor.      Para las solicitudes de renovacin de recetas, pida a su farmacia que se ponga en contacto con nuestra oficina y deje que transcurran 24 horas para que se complete el proceso de las renovaciones.    Hoy usted recibi los siguientes agentes de quimioterapia e/o inmunoterapia- Kanjinti      Para ayudar a prevenir las nuseas y los vmitos despus de su tratamiento, le recomendamos que tome su medicamento para las nuseas segn las indicaciones.  LOS SNTOMAS QUE DEBEN COMUNICARSE INMEDIATAMENTE SE INDICAN A CONTINUACIN: *FIEBRE SUPERIOR A 100.4 F (38 C) O MS *ESCALOFROS O SUDORACIN *NUSEAS Y VMITOS QUE NO SE CONTROLAN CON EL MEDICAMENTO PARA LAS NUSEAS *DIFICULTAD INUSUAL PARA RESPIRAR  *MORETONES O HEMORRAGIAS NO HABITUALES *PROBLEMAS URINARIOS (dolor o ardor al Garment/textile technologist o frecuencia para Garment/textile technologist) *PROBLEMAS INTESTINALES (diarrea inusual, estreimiento, dolor cerca del ano) SENSIBILIDAD EN LA BOCA Y EN LA GARGANTA CON O SIN LA PRESENCIA DE LCERAS (dolor de garganta, llagas en la boca o dolor de muelas/dientes) ERUPCIN,  HINCHAZN O DOLORES INUSUALES FLUJO VAGINAL INUSUAL O PICAZN/RASQUIA    Los puntos marcados con un asterisco ( *) indican una posible emergencia y debe hacer un seguimiento tan pronto como le sea posible o vaya al Departamento de Emergencias si se le presenta algn problema.  Por favor, muestre la [place] DE ADVERTENCIA DE Windy Canny DE ADVERTENCIA DE Benay Spice al registrarse en [address] de Emergencias y a la enfermera de triaje.  Si tiene preguntas despus de su visita o necesita cancelar o volver a programar su cita, por favor pngase en contacto con [facility] CANCER CTR AT [place]-MEDICAL ONCOLOGY  [phone]  [place] instrucciones. Las horas de oficina son de 8:00 a.m. a 4:30 p.m. de lunes a viernes. Por favor, tenga en cuenta que los mensajes de voz que se dejan despus de las 4:00 p.m. posiblemente no se devolvern hasta el siguiente da de [place].  Cerramos los fines de semana y [employer]. En todo momento tiene acceso a una enfermera para preguntas urgentes. Por favor, llame al nmero principal de la clnica  [phone] y [place] instrucciones.   Para cualquier pregunta que no sea de carcter urgente, tambin puede ponerse en contacto con su proveedor [employer]. Ahora ofrecemos visitas electrnicas para cualquier persona mayor de 18 aos que solicite atencin mdica en lnea para los sntomas que no sean urgentes. Para ms detalles vaya a mychart.[email or web address].   Tambin puede bajar la aplicacin de MyChart! Vaya a la tienda de aplicaciones, busque "MyChart", abra la aplicacin, seleccione  [place], e ingrese con su nombre de usuario y la contrasea de Pharmacist, community.

## 2022-04-04 ENCOUNTER — Encounter: Payer: Self-pay | Admitting: Oncology

## 2022-04-04 ENCOUNTER — Other Ambulatory Visit: Payer: Self-pay | Admitting: Pharmacist

## 2022-04-04 ENCOUNTER — Other Ambulatory Visit (HOSPITAL_COMMUNITY): Payer: Self-pay

## 2022-04-04 DIAGNOSIS — C50919 Malignant neoplasm of unspecified site of unspecified female breast: Secondary | ICD-10-CM

## 2022-04-04 MED ORDER — CAPECITABINE 500 MG PO TABS
1500.0000 mg | ORAL_TABLET | Freq: Two times a day (BID) | ORAL | 2 refills | Status: DC
  Filled 2022-04-04: qty 84, 21d supply, fill #0
  Filled 2022-04-17: qty 84, 21d supply, fill #1
  Filled 2022-05-16 – 2022-05-23 (×2): qty 84, 21d supply, fill #2

## 2022-04-11 ENCOUNTER — Ambulatory Visit: Payer: Self-pay

## 2022-04-11 ENCOUNTER — Ambulatory Visit: Payer: Self-pay | Admitting: Oncology

## 2022-04-17 ENCOUNTER — Encounter: Payer: Self-pay | Admitting: Oncology

## 2022-04-17 ENCOUNTER — Other Ambulatory Visit (HOSPITAL_COMMUNITY): Payer: Self-pay

## 2022-04-18 ENCOUNTER — Ambulatory Visit: Payer: Self-pay

## 2022-04-18 ENCOUNTER — Ambulatory Visit: Payer: Self-pay | Admitting: Oncology

## 2022-04-18 ENCOUNTER — Encounter: Payer: Self-pay | Admitting: Oncology

## 2022-04-18 ENCOUNTER — Other Ambulatory Visit: Payer: Self-pay

## 2022-04-18 ENCOUNTER — Other Ambulatory Visit: Payer: Self-pay | Admitting: *Deleted

## 2022-04-18 MED ORDER — MORPHINE SULFATE ER 15 MG PO TBCR
15.0000 mg | EXTENDED_RELEASE_TABLET | Freq: Three times a day (TID) | ORAL | 0 refills | Status: DC
  Filled 2022-04-18: qty 90, 30d supply, fill #0

## 2022-04-19 ENCOUNTER — Other Ambulatory Visit: Payer: Self-pay

## 2022-04-20 ENCOUNTER — Other Ambulatory Visit: Payer: Self-pay

## 2022-04-21 ENCOUNTER — Inpatient Hospital Stay: Payer: Self-pay

## 2022-04-21 ENCOUNTER — Other Ambulatory Visit: Payer: Self-pay

## 2022-04-21 ENCOUNTER — Encounter: Payer: Self-pay | Admitting: Oncology

## 2022-04-21 ENCOUNTER — Other Ambulatory Visit: Payer: Self-pay | Admitting: *Deleted

## 2022-04-21 ENCOUNTER — Other Ambulatory Visit: Payer: Self-pay | Admitting: Oncology

## 2022-04-21 ENCOUNTER — Inpatient Hospital Stay: Payer: Self-pay | Attending: Oncology

## 2022-04-21 ENCOUNTER — Ambulatory Visit: Payer: Self-pay | Admitting: Oncology

## 2022-04-21 ENCOUNTER — Inpatient Hospital Stay (HOSPITAL_BASED_OUTPATIENT_CLINIC_OR_DEPARTMENT_OTHER): Payer: Self-pay | Admitting: Oncology

## 2022-04-21 VITALS — BP 110/60 | HR 72 | Temp 97.2°F | Resp 16 | Ht 62.0 in | Wt 132.0 lb

## 2022-04-21 VITALS — BP 110/60 | HR 72 | Temp 97.2°F | Resp 16 | Wt 132.0 lb

## 2022-04-21 DIAGNOSIS — G893 Neoplasm related pain (acute) (chronic): Secondary | ICD-10-CM

## 2022-04-21 DIAGNOSIS — C7931 Secondary malignant neoplasm of brain: Secondary | ICD-10-CM | POA: Insufficient documentation

## 2022-04-21 DIAGNOSIS — C50919 Malignant neoplasm of unspecified site of unspecified female breast: Secondary | ICD-10-CM

## 2022-04-21 DIAGNOSIS — C7951 Secondary malignant neoplasm of bone: Secondary | ICD-10-CM | POA: Insufficient documentation

## 2022-04-21 DIAGNOSIS — Z79899 Other long term (current) drug therapy: Secondary | ICD-10-CM

## 2022-04-21 DIAGNOSIS — M25551 Pain in right hip: Secondary | ICD-10-CM | POA: Insufficient documentation

## 2022-04-21 DIAGNOSIS — Z171 Estrogen receptor negative status [ER-]: Secondary | ICD-10-CM | POA: Insufficient documentation

## 2022-04-21 DIAGNOSIS — M8440XA Pathological fracture, unspecified site, initial encounter for fracture: Secondary | ICD-10-CM

## 2022-04-21 DIAGNOSIS — Z5112 Encounter for antineoplastic immunotherapy: Secondary | ICD-10-CM | POA: Insufficient documentation

## 2022-04-21 DIAGNOSIS — C50412 Malignant neoplasm of upper-outer quadrant of left female breast: Secondary | ICD-10-CM | POA: Insufficient documentation

## 2022-04-21 DIAGNOSIS — Z800 Family history of malignant neoplasm of digestive organs: Secondary | ICD-10-CM | POA: Insufficient documentation

## 2022-04-21 DIAGNOSIS — M79604 Pain in right leg: Secondary | ICD-10-CM | POA: Insufficient documentation

## 2022-04-21 DIAGNOSIS — C50912 Malignant neoplasm of unspecified site of left female breast: Secondary | ICD-10-CM

## 2022-04-21 LAB — CBC WITH DIFFERENTIAL/PLATELET
Abs Immature Granulocytes: 0.02 10*3/uL (ref 0.00–0.07)
Basophils Absolute: 0 10*3/uL (ref 0.0–0.1)
Basophils Relative: 1 %
Eosinophils Absolute: 0.2 10*3/uL (ref 0.0–0.5)
Eosinophils Relative: 4 %
HCT: 36.9 % (ref 36.0–46.0)
Hemoglobin: 13 g/dL (ref 12.0–15.0)
Immature Granulocytes: 0 %
Lymphocytes Relative: 27 %
Lymphs Abs: 1.5 10*3/uL (ref 0.7–4.0)
MCH: 32 pg (ref 26.0–34.0)
MCHC: 35.2 g/dL (ref 30.0–36.0)
MCV: 90.9 fL (ref 80.0–100.0)
Monocytes Absolute: 0.4 10*3/uL (ref 0.1–1.0)
Monocytes Relative: 8 %
Neutro Abs: 3.3 10*3/uL (ref 1.7–7.7)
Neutrophils Relative %: 60 %
Platelets: 350 10*3/uL (ref 150–400)
RBC: 4.06 MIL/uL (ref 3.87–5.11)
RDW: 13.7 % (ref 11.5–15.5)
WBC: 5.4 10*3/uL (ref 4.0–10.5)
nRBC: 0 % (ref 0.0–0.2)

## 2022-04-21 LAB — COMPREHENSIVE METABOLIC PANEL
ALT: 16 U/L (ref 0–44)
AST: 26 U/L (ref 15–41)
Albumin: 3.9 g/dL (ref 3.5–5.0)
Alkaline Phosphatase: 74 U/L (ref 38–126)
Anion gap: 8 (ref 5–15)
BUN: 10 mg/dL (ref 6–20)
CO2: 22 mmol/L (ref 22–32)
Calcium: 8.7 mg/dL — ABNORMAL LOW (ref 8.9–10.3)
Chloride: 109 mmol/L (ref 98–111)
Creatinine, Ser: 0.57 mg/dL (ref 0.44–1.00)
GFR, Estimated: >60 mL/min (ref >60)
Glucose, Bld: 86 mg/dL (ref 70–99)
Potassium: 3 mmol/L — ABNORMAL LOW (ref 3.5–5.1)
Sodium: 139 mmol/L (ref 135–145)
Total Bilirubin: 0.5 mg/dL (ref 0.3–1.2)
Total Protein: 6.9 g/dL (ref 6.5–8.1)

## 2022-04-21 MED ORDER — TRASTUZUMAB-ANNS CHEMO 150 MG IV SOLR
378.0000 mg | Freq: Once | INTRAVENOUS | Status: AC
  Administered 2022-04-21: 378 mg via INTRAVENOUS
  Filled 2022-04-21: qty 18

## 2022-04-21 MED ORDER — HEPARIN SOD (PORK) LOCK FLUSH 100 UNIT/ML IV SOLN
500.0000 [IU] | Freq: Once | INTRAVENOUS | Status: AC
  Administered 2022-04-21: 500 [IU] via INTRAVENOUS
  Filled 2022-04-21: qty 5

## 2022-04-21 MED ORDER — OXYCODONE HCL 10 MG PO TABS
5.0000 mg | ORAL_TABLET | ORAL | 0 refills | Status: DC | PRN
  Filled 2022-04-21: qty 45, 8d supply, fill #0

## 2022-04-21 MED ORDER — OMEPRAZOLE 20 MG PO CPDR
20.0000 mg | DELAYED_RELEASE_CAPSULE | Freq: Every day | ORAL | 3 refills | Status: DC
  Filled 2022-04-21: qty 30, 30d supply, fill #0

## 2022-04-21 MED ORDER — ACETAMINOPHEN 325 MG PO TABS
650.0000 mg | ORAL_TABLET | Freq: Once | ORAL | Status: AC
  Administered 2022-04-21: 650 mg via ORAL
  Filled 2022-04-21: qty 2

## 2022-04-21 MED ORDER — BUTALBITAL-APAP-CAFFEINE 50-325-40 MG PO TABS: 1.0000 | ORAL_TABLET | Freq: Three times a day (TID) | ORAL | 1 refills | Status: DC | PRN

## 2022-04-21 MED ORDER — DULOXETINE HCL 60 MG PO CPEP
60.0000 mg | ORAL_CAPSULE | Freq: Every day | ORAL | 3 refills | Status: DC
  Filled 2022-04-21: qty 30, 30d supply, fill #0

## 2022-04-21 MED ORDER — GABAPENTIN 600 MG PO TABS
600.0000 mg | ORAL_TABLET | Freq: Three times a day (TID) | ORAL | 3 refills | Status: DC
  Filled 2022-04-21: qty 90, 30d supply, fill #0

## 2022-04-21 MED ORDER — LORAZEPAM 0.5 MG PO TABS
0.5000 mg | ORAL_TABLET | Freq: Four times a day (QID) | ORAL | 0 refills | Status: DC | PRN
  Filled 2022-04-21: qty 45, 12d supply, fill #0

## 2022-04-21 MED ORDER — SODIUM CHLORIDE 0.9 % IV SOLN
Freq: Once | INTRAVENOUS | Status: AC
  Filled 2022-04-21: qty 250

## 2022-04-21 MED ORDER — DIPHENHYDRAMINE HCL 50 MG/ML IJ SOLN
25.0000 mg | Freq: Once | INTRAMUSCULAR | Status: AC
  Administered 2022-04-21: 25 mg via INTRAVENOUS
  Filled 2022-04-21: qty 1

## 2022-04-21 MED ORDER — POLYETHYLENE GLYCOL 3350 17 GM/SCOOP PO POWD
17.0000 g | Freq: Every day | ORAL | 3 refills | Status: DC | PRN
  Filled 2022-04-21: qty 238, 14d supply, fill #0

## 2022-04-21 NOTE — Patient Instructions (Signed)
Instrucciones al darle de alta: Discharge Instructions Gracias por elegir al [facility] [place] para br[place]ar[place] atencin mdica de oncologa y hematologa.   Si usted tiene una cita de laboratorio con el [facility], por favor vaya directamente al [facility] y regstrese en el rea de registro.   Use ropa cmoda y adecuada para tener fcil acceso a las vas del Portacath (acceso venoso de larga duracin) o la lnea PICC (catter central colocado por va perifrica).   Nos esforzamos por ofrecerle tiempo de calidad con su proveedor. Es posible que tenga que volver a programar su cita si llega tarde (15 minutos o ms).  El llegar tarde le afecta a usted y a otros pacientes cuyas citas son posteriores a la suya.  Adems, si usted falta a tres o ms citas sin avisar a la oficina, puede ser retirado(a) de la clnica a discrecin del proveedor.      Para las solicitudes de renovacin de recetas, pida a su farmacia que se ponga en contacto con nuestra oficina y deje que transcurran 72 horas para que se complete el proceso de las renovaciones.    Para ayudar a prevenir las nuseas y los vmitos despus de su tratamiento, le recomendamos que tome su medicamento para las nuseas segn las indicaciones.  LOS SNTOMAS QUE DEBEN COMUNICARSE INMEDIATAMENTE SE INDICAN A CONTINUACIN: *FIEBRE SUPERIOR A 100.4 F (38 C) O MS *ESCALOFROS O SUDORACIN *NUSEAS Y VMITOS QUE NO SE CONTROLAN CON EL MEDICAMENTO PARA LAS NUSEAS *DIFICULTAD INUSUAL PARA RESPIRAR  *MORETONES O HEMORRAGIAS NO HABITUALES *PROBLEMAS URINARIOS (dolor o ardor al orinar o frecuencia para orinar) *PROBLEMAS INTESTINALES (diarrea inusual, estreimiento, dolor cerca del ano) SENSIBILIDAD EN LA BOCA Y EN LA GARGANTA CON O SIN LA PRESENCIA DE LCERAS (dolor de garganta, llagas en la boca o dolor de muelas/dientes) ERUPCIN, HINCHAZN O DOLORES INUSUALES FLUJO VAGINAL INUSUAL O PICAZN/RASQUIA    Los puntos marcados  con un asterisco ( *) indican una posible emergencia y debe hacer un seguimiento tan pronto como le sea posible o vaya al Departamento de Emergencias si se le presenta algn problema.  Por favor, muestre la TARJETA DE ADVERTENCIA DE QUIMIOTERAPIA O LA TARJETA DE ADVERTENCIA DE INMUNOTERAPIA al registrarse en el Departamento de Emergencias y a la enfermera de triaje.  Si tiene preguntas despus de su visita o necesita cancelar o volver a programar su cita, por favor pngase en contacto con [facility]  [phone]  y siga las instrucciones. Las horas de oficina son de 8:00 a.m. a 4:30 p.m. de lunes a viernes. Por favor, tenga en cuenta que los mensajes de voz que se dejan despus de las 4:00 p.m. posiblemente no se devolvern hasta el siguiente da de trabajo.  Cerramos los fines de semana y los das festivos importantes. En todo momento tiene acceso a una enfermera para preguntas urgentes. Por favor, llame al nmero principal de la clnica  [phone] y siga las instrucciones.   Para cualquier pregunta que no sea de carcter urgente, tambin puede ponerse en contacto con su proveedor utilizando MyChart. Ahora ofrecemos visitas electrnicas para cualquier persona mayor de 18 aos que solicite atencin mdica en lnea para los sntomas que no sean urgentes. Para ms detalles vaya a [email or web address].   Tambin puede bajar la aplicacin de MyChart! Vaya a la tienda de aplicaciones, busque "MyChart", abra la aplicacin, seleccione [place], e ingrese con su nombre de usuario y la contrasea de MyChart.  

## 2022-04-21 NOTE — Progress Notes (Signed)
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  History obtained with the help of an interpreter.Patient self palpated left breast mass which was followed by a diagnostic bilateral mammogram.  Mammogram showed 3.1 x 2.9 x 1.9 cm hypoechoic mass at the 1 o'clock position of the left breast.  For abnormal cortically thickened left axillary lymph nodes measuring up to 5 mm.  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  Patient was also having ongoing back pain and was seen by [facility] orthopedics Dr. Doyle Askew who ordered MRI lumbar spine without contrast which showed possible pathologic fractures of L1 and L4 vertebral bodies with greater than 50% height loss at L1 and abnormal signal involving L2-L3 S1 as well as right iliac bone concerning for metastatic disease.  Patient is a single mother of 3 adult children and is very anxious today.  She reports significant back pain which radiates to her bilateral thighs.  Denies any focal tingling numbness or weakness.  Denies any bowel bladder incontinence.  Pain has been uncontrolled despite taking Tylenol.  No prior history of abnormal breast biopsies.  No family history of breast  cancer   PET and MRI showed 3 areas of pathologic fracture of her spine as well as widespread bony metastatic disease and concern for impending fracture of the right hip.  Given her worsening pain she was asked to come to the ER.  She has been evaluated by Dr. Rudene Christians from orthopedic surgery and underwent kyphoplasty at 3 different levels. T6 L1 and L4 along with radiofrequency ablation.    She also underwent prophylactic fixation of the right hip and not affected the sacral region.     Patient received first dose of Herceptin and Perjeta on 09/04/2019.  Patient initially received Taxol and Herceptin perjeta.  Presently patient is on maintenance Herceptin and Perjeta alone.  Patient found to have brain mets in November 2022 for which she received whole brain radiation treatment but there was no evidence of systemic progression.  She was continued on Herceptin and Perjeta.  Repeat MRI in June 2023 showed slight progression of brain mets.  Patient was switched to Herceptin tucatinib Xeloda     Interval history-patient had a fall a couple of weeks ago.  She reports occasional low back pain since then but pain is not getting worse and she is able to ambulate.  She has ongoing fatigue.  She has not had a good bowel movement in last 1 week.  ECOG PS- 1 Pain scale- 3 Opioid associated constipation- no  Review of systems- Review of Systems  Constitutional:  Positive for malaise/fatigue.  Musculoskeletal:  Positive for back pain and joint pain.  Neurological:  Positive for sensory change (Peripheral neuropathy).  No Known Allergies   Past Medical History:  Diagnosis Date   Anxiety    Breast cancer ([place])    with mets   Cancer ([place])    Colitis    COVID-19 in immunocompromised patient ([place])    Family history of colon cancer    Vertigo      Past Surgical History:  Procedure Laterality Date   BREAST BIOPSY Left 08/14/2019   [place] bx of mass, coil marker, path pending   BREAST BIOPSY Left  08/14/2019   [place] bx of LN, hydromarker, path pending   BREAST BIOPSY Left 08/14/2019   affirm bx of calcs, x marker, path pending   ESOPHAGOGASTRODUODENOSCOPY (EGD) WITH PROPOFOL N/A 10/05/2019   Procedure: ESOPHAGOGASTRODUODENOSCOPY (EGD) WITH PROPOFOL;  Surgeon: Lin Landsman, MD;  Location: [place];  Service: Gastroenterology;  Laterality: N/A;   FLEXIBLE SIGMOIDOSCOPY N/A 10/05/2019   Procedure: FLEXIBLE SIGMOIDOSCOPY;  Surgeon: Lin Landsman, MD;  Location: [facility] END[facility]COPY;  Service: Gastroenterology;  Laterality: N/A;   INTRAMEDULLARY (IM) NAIL INTERTROCHANTERIC Right 09/01/2019   Procedure: INTRAMEDULLARY (IM) NAIL INTERTROCHANTRIC AND RADIOFREQUENCY ABLATION;  Surgeon: Hessie Knows, MD;  Location: [facility] ORS;  Service: Orthopedics;  Laterality: Right;   KYPHOPLASTY N/A 08/29/2019   Procedure: KYPHOPLASTY T6, L1,L4 ,  RADIOFREQUENCY ABLATION;  Surgeon: Hessie Knows, MD;  Location: [facility] ORS;  Service: Orthopedics;  Laterality: N/A;   KYPHOPLASTY Right 09/01/2019   Procedure: Right Sacral Radiofrequency Ablation and Cement Augmentation, Right sacrum and iliac crest;  Surgeon: Hessie Knows, MD;  Location: [facility] OR[facility];  Service: Orthopedics;  Laterality: Right;   PORTA CATH INSERTION N/A 08/28/2019   Procedure: PORTA CATH INSERTION;  Surgeon: Algernon Huxley, MD;  Location: [place] [facility];  Service: Cardiovascular;  Laterality: N/A;    Social History   Socioeconomic History   Marital status: Single    Spouse name: Not on file   Number of children: Not on file   Years of education: Not on file   Highest education level: Not on file  Occupational History   Not on file  Tobacco Use   Smoking status: Never   Smokeless tobacco: Never  Vaping Use   Vaping Use: Never used  Substance and Sexual Activity   Alcohol use: Not Currently   Drug use: Not Currently   Sexual activity: Not Currently    Birth control/protection: None  Other Topics Concern   Not on file  Social  History Narrative   Lives at home with children   Social Determinants of Health   Financial Resource Strain: High Risk (11/18/2021)   Overall Financial Resource Strain (CARDIA)    Difficulty of Paying Living Expenses: Very hard  Food Insecurity: Food Insecurity Present (12/26/2021)   Hunger Vital Sign    Worried About Running Out of Food in the Last Year: Often true    Ran Out of Food in the Last Year: Often true  Transportation Needs: Unmet Transportation Needs (12/26/2021)   [place] - Hydrologist (Medical): Yes    Lack of Transportation (Non-Medical): Yes  Physical Activity: Inactive (11/18/2021)   Exercise Vital Sign    Days of Exercise per Week: 0 days    Minutes of Exercise per Session: 0 min  Stress: Stress Concern Present (11/18/2021)   [place]    Feeling of Stress : Very much  Social Connections: Socially Isolated (11/18/2021)   Social Connection and Isolation Panel [NHANES]    Frequency of  Communication with Friends and Family: Three times a week    Frequency of Social Gatherings with Friends and Family: Three times a week    Attends Religious Services: Never    Active Member of Clubs or Organizations: No    Attends Archivist Meetings: Never    Marital Status: Never married  Intimate Partner Violence: Not At Risk (11/18/2021)   Humiliation, Afraid, Rape, and Kick questionnaire    Fear of Current or Ex-Partner: No    Emotionally Abused: No    Physically Abused: No    Sexually Abused: No    Family History  Problem Relation Age of Onset   Colon cancer Maternal Uncle      Current Outpatient Medications:    albuterol (VENTOLIN HFA) 108 (90 Base) MCG/ACT inhaler, Inhale 2 puffs into the lungs every 6 (six) hours as needed for wheezing or shortness of breath., Disp: 8.5 g, Rfl: 2   benzoyl peroxide-erythromycin (BENZAMYCIN) gel, Apply topically 2 (two) times daily., Disp:  46.6 g, Rfl: 0   Calcium Carb-Cholecalciferol 600-10 MG-MCG TABS, Take 1 tablet by mouth 2 (two) times daily., Disp: 180 tablet, Rfl: 0   capecitabine (XELODA) 500 MG tablet, Take 3 tablets (1,500 mg total) by mouth 2 (two) times daily after a meal. Take for 14 days, then hold for 7 days. Repeat every 21 days., Disp: 84 tablet, Rfl: 2   dexamethasone (DECADRON) 4 MG tablet, Take 2 tablets (8 mg total) by mouth 2 (two) times daily., Disp: 120 tablet, Rfl: 0   morphine (MS CONTIN) 15 MG 12 hr tablet, Take 1 tablet (15 mg total) by mouth every 8 (eight) hours., Disp: 90 tablet, Rfl: 0   OLANZapine (ZYPREXA) 10 MG tablet, Take 1 tablet (10 mg total) by mouth at bedtime as needed (nausea)., Disp: 30 tablet, Rfl: 2   ondansetron (ZOFRAN) 8 MG tablet, Take 1 tablet (8 mg total) by mouth every 8 (eight) hours as needed for nausea or vomiting., Disp: 45 tablet, Rfl: 2   tucatinib (TUKYSA) 150 MG tablet, Take 2 tablets (300 mg total) by mouth 2 (two) times daily., Disp: 120 tablet, Rfl: 3   butalbital-acetaminophen-caffeine (FIORICET) 50-325-40 MG tablet, Take 1-2 tablets by mouth every 8 (eight) hours as needed for headache., Disp: 30 tablet, Rfl: 1   diphenoxylate-atropine (LOMOTIL) 2.5-0.025 MG tablet, Take 1 tablet by mouth 4 (four) times daily as needed for diarrhea or loose stools. (Patient not taking: Reported on 03/31/2022), Disp: 40 tablet, Rfl: 0   DULoxetine (CYMBALTA) 60 MG capsule, Take 1 capsule (60 mg total) by mouth daily., Disp: 30 capsule, Rfl: 3   gabapentin (NEURONTIN) 600 MG tablet, Take 1 tablet (600 mg total) by mouth 3 (three) times daily., Disp: 90 tablet, Rfl: 3   lidocaine-prilocaine (EMLA) cream, Apply 1 application topically as needed. Apply small amount to port site at least 1 hour prior to it being accessed, cover with plastic wrap, Disp: 30 g, Rfl: 3   LORazepam (ATIVAN) 0.5 MG tablet, Take 1 tablet (0.5 mg total) by mouth every 6 (six) hours as needed for anxiety (and nausea). AS  NEEDED FOR NAUSEA, Disp: 45 tablet, Rfl: 0   naloxone (NARCAN) nasal spray 4 mg/0.1 mL, Administer one spray in nostril for opioid overdose. If unresponsive after 1 dose, may repeat a dose every 2-3 minutes in alternating nostrils until emergency services arrive. (Patient not taking: Reported on 03/31/2022), Disp: 2 each, Rfl: 3   omeprazole (PRILOSEC) 20 MG capsule, Take 1 capsule (20  mg total) by mouth daily., Disp: 30 capsule, Rfl: 3   Oxycodone HCl 10 MG TABS, Take 1/2 to 1 tablet (5-10 mg total) by mouth every 4 (four) hours as needed., Disp: 45 tablet, Rfl: 0   polyethylene glycol powder (MIRALAX) 17 GM/SCOOP powder, Take 17 g by mouth daily as needed., Disp: 238 g, Rfl: 3 No current facility-administered medications for this visit.  Facility-Administered Medications Ordered in Other Visits:    0.9 %  sodium chloride infusion, , Intravenous, Continuous, Burns, Wandra Feinstein, NP, Stopped at 06/02/20 1453   heparin lock flush 100 unit/mL, 500 Units, Intravenous, Once, Sindy Guadeloupe, MD   heparin lock flush 100 unit/mL, 500 Units, Intravenous, Once, Sindy Guadeloupe, MD   sodium chloride flush (NS) 0.9 % injection 10 mL, 10 mL, Intravenous, Once, Sindy Guadeloupe, MD   sodium chloride flush (NS) 0.9 % injection 10 mL, 10 mL, Intravenous, Once, Sindy Guadeloupe, MD  Physical exam:  Vitals:   04/21/22 1201  BP: 110/60  Pulse: 72  Resp: 16  Temp: (!) 97.2 F (36.2 C)  TempSrc: Tympanic  Weight: 132 lb (59.9 kg)  Height: '5\' 2"'$  (1.575 m)   Physical Exam Cardiovascular:     Rate and Rhythm: Normal rate and regular rhythm.     Heart sounds: Normal heart sounds.  Pulmonary:     Effort: Pulmonary effort is normal.     Breath sounds: Normal breath sounds.  Abdominal:     General: Bowel sounds are normal.     Palpations: Abdomen is soft.  Skin:    General: Skin is warm and dry.  Neurological:     Mental Status: She is alert and oriented to person, place, and time.         Latest Ref Rng &  Units 04/21/2022   10:40 AM  CMP  Glucose 70 - 99 mg/dL 86   BUN 6 - 20 mg/dL 10   Creatinine 0.44 - 1.00 mg/dL 0.57   Sodium 135 - 145 mmol/L 139   Potassium 3.5 - 5.1 mmol/L 3.0   Chloride 98 - 111 mmol/L 109   CO2 22 - 32 mmol/L 22   Calcium 8.9 - 10.3 mg/dL 8.7   Total Protein 6.5 - 8.1 g/dL 6.9   Total Bilirubin 0.3 - 1.2 mg/dL 0.5   Alkaline Phos 38 - 126 U/L 74   AST 15 - 41 U/L 26   ALT 0 - 44 U/L 16       Latest Ref Rng & Units 04/21/2022   10:40 AM  CBC  WBC 4.0 - 10.5 K/uL 5.4   Hemoglobin 12.0 - 15.0 g/dL 13.0   Hematocrit 36.0 - 46.0 % 36.9   Platelets 150 - 400 K/uL 350      Assessment and plan- Patient is a 42 y.o. 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  She is here for on treatment assessment prior to next cycle of Herceptin.  Counts okay to proceed with Herceptin today.  She will start her next cycle of Xeloda today 2 weeks on and 1 week off.  Patient has been complaining of more abdominal pain and nausea with Xeloda and have asked her to reduce the dose of Xeloda to 2 tablets twice a day instead of 3 tablets twice a day.  She will also continue Tucatinib until progression or toxicity.  She has repeat CT chest abdomen pelvis with contrast bone scan MRI  and echocardiogram coming up in the next couple of weeks.  I will see him back in 3 weeks to discuss the results of the scan  Neoplasm related pain: Continue as needed oxycodone.  Medications renewed today  Bone metastases: Xgeva currently on hold due to hypocalcemia.   Visit Diagnosis 1. Encounter for monoclonal antibody treatment for malignancy   2. High risk medication use   3. Primary cancer of left breast with metastasis to other site ([place])   4. Neoplasm related pain      Dr. Randa Evens, MD, MPH [facility] [phone] 04/21/2022 3:49 PM

## 2022-04-24 ENCOUNTER — Inpatient Hospital Stay: Payer: Self-pay

## 2022-04-24 ENCOUNTER — Encounter: Payer: Self-pay | Admitting: Oncology

## 2022-04-24 ENCOUNTER — Encounter: Payer: Self-pay | Admitting: Emergency Medicine

## 2022-04-24 ENCOUNTER — Encounter: Payer: Self-pay | Admitting: Medical Oncology

## 2022-04-24 ENCOUNTER — Other Ambulatory Visit (HOSPITAL_COMMUNITY): Payer: Self-pay

## 2022-04-24 ENCOUNTER — Inpatient Hospital Stay (HOSPITAL_BASED_OUTPATIENT_CLINIC_OR_DEPARTMENT_OTHER): Payer: Self-pay | Admitting: Medical Oncology

## 2022-04-24 ENCOUNTER — Emergency Department
Admission: EM | Admit: 2022-04-24 | Discharge: 2022-04-24 | Disposition: A | Discharge status: Home / Self Care | Payer: Self-pay | Attending: Emergency Medicine | Admitting: Emergency Medicine

## 2022-04-24 ENCOUNTER — Emergency Department: Payer: Self-pay

## 2022-04-24 ENCOUNTER — Other Ambulatory Visit: Payer: Self-pay

## 2022-04-24 ENCOUNTER — Telehealth: Payer: Self-pay

## 2022-04-24 VITALS — BP 101/71 | HR 80 | Temp 98.1°F | Resp 20 | Ht 62.0 in | Wt 135.0 lb

## 2022-04-24 DIAGNOSIS — M79604 Pain in right leg: Secondary | ICD-10-CM

## 2022-04-24 DIAGNOSIS — M25551 Pain in right hip: Secondary | ICD-10-CM

## 2022-04-24 DIAGNOSIS — M8440XA Pathological fracture, unspecified site, initial encounter for fracture: Secondary | ICD-10-CM

## 2022-04-24 DIAGNOSIS — C50912 Malignant neoplasm of unspecified site of left female breast: Secondary | ICD-10-CM

## 2022-04-24 DIAGNOSIS — Z853 Personal history of malignant neoplasm of breast: Secondary | ICD-10-CM | POA: Insufficient documentation

## 2022-04-24 MED ORDER — LIDOCAINE-PRILOCAINE 2.5-2.5 % EX CREA
1.0000 | TOPICAL_CREAM | CUTANEOUS | 3 refills | Status: DC | PRN
  Filled 2022-04-24: qty 30, 30d supply, fill #0

## 2022-04-24 MED ORDER — HYDROMORPHONE HCL 1 MG/ML IJ SOLN
1.0000 mg | Freq: Once | INTRAMUSCULAR | Status: AC
  Administered 2022-04-24: 1 mg via INTRAMUSCULAR
  Filled 2022-04-24: qty 1

## 2022-04-24 NOTE — Telephone Encounter (Signed)
Patient called requesting to be seen in [facility]. Patient states she can't walk this morning and she is in pain. Patient states her Lip is swollen.

## 2022-04-24 NOTE — Progress Notes (Signed)
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 female who presents today brought in by her friend for:  Right Hip Pain: Medical interpretor Sophia [name] assisting with our visit today. Patient reports that yesterday as she was getting out of her bed she suddenly had 10/10 pain of her right upper leg. Ventral in nature and sharp. Felt as if the leg "gave out". She does report having some moderate pain of this area for the past few weeks but nothing like recent pain. She has not been able to walk or move much since. She has tried her normal pain medication (morphine and oxycodone) without much relief of pain which is new. She does have a history of impending fracture of the right hip however she underwent prophylactic fixation of the right hip 2021. Has also had prophylactic radiation of this area.  Last bone scan on 01/31/2022 showed "new focus of abnormal radiotracer uptake in the distal right femoral metaphysis". 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 Last xgeva treatment was on 09/16/2021. She is able to move her foot but this causes pain in the hip area. She denies neuro weakness, incontinence, fever.    PAST MEDICAL HISTORY: Past Medical History:   Diagnosis Date   Anxiety    Breast cancer ([place])    with mets   Cancer ([place])    Colitis    COVID-19 in immunocompromised patient ([place])    Family history of colon cancer    Vertigo     PAST SURGICAL HISTORY:  Past Surgical History:  Procedure Laterality Date   BREAST BIOPSY Left 08/14/2019   [place] bx of mass, coil marker, path pending   BREAST BIOPSY Left 08/14/2019   [place] bx of LN, hydromarker, path pending   BREAST BIOPSY Left 08/14/2019   affirm bx of calcs, x marker, path pending   ESOPHAGOGASTRODUODENOSCOPY (EGD) WITH PROPOFOL N/A 10/05/2019   Procedure: ESOPHAGOGASTRODUODENOSCOPY (EGD) WITH PROPOFOL;  Surgeon: Lin Landsman, MD;  Location: [facility];  Service: Gastroenterology;  Laterality: N/A;   FLEXIBLE SIGMOIDOSCOPY N/A 10/05/2019   Procedure: FLEXIBLE SIGMOIDOSCOPY;  Surgeon: Lin Landsman, MD;  Location: [facility] ENDOSCOPY;  Service: Gastroenterology;  Laterality: N/A;   INTRAMEDULLARY (IM) NAIL INTERTROCHANTERIC Right 09/01/2019   Procedure: INTRAMEDULLARY (IM) NAIL INTERTROCHANTRIC AND RADIOFREQUENCY ABLATION;  Surgeon: Hessie Knows, MD;  Location: [facility] ORS;  Service: Orthopedics;  Laterality: Right;   KYPHOPLASTY N/A 08/29/2019   Procedure: KYPHOPLASTY T6, L1,L4 ,  RADIOFREQUENCY ABLATION;  Surgeon: Hessie Knows, MD;  Location: [facility] ORS;  Service: Orthopedics;  Laterality: N/A;   KYPHOPLASTY Right 09/01/2019   Procedure: Right Sacral Radiofrequency Ablation and Cement Augmentation, Right sacrum and iliac crest;  Surgeon: Hessie Knows, MD;  Location: [facility] ORS;  Service: Orthopedics;  Laterality: Right;   PORTA CATH INSERTION N/A 08/28/2019  Procedure: PORTA CATH INSERTION;  Surgeon: Algernon Huxley, MD;  Location: [facility];  Service: Cardiovascular;  Laterality: N/A;    HEMATOLOGY/ONCOLOGY HISTORY:  Oncology History  Primary cancer of left breast with metastasis to other site [facility])  08/28/2019 Initial Diagnosis   Primary cancer of left breast with  metastasis to other site [facility])   01/21/2021 Cancer Staging   Staging form: Breast, AJCC 8th Edition - Clinical stage from 01/21/2021: Stage IV (cT2, cN1, cM1, G2, ER-, PR-, HER2+) - Signed by Sindy Guadeloupe, MD on 01/21/2021 Histologic grading system: 3 grade system   Primary malignant neoplasm of breast with metastasis ([place])  08/29/2019 Initial Diagnosis   Metastatic breast cancer ([place])   09/04/2019 - 10/31/2021 Chemotherapy   Patient is on Treatment Plan : BREAST Weekly Paclitaxel + Trastuzumab + Pertuzumab q21d      Genetic Testing   Negative genetic testing. No pathogenic variants identified on the [facility] CancerNext-Expanded+RNA panel. The report date is 04/13/2020.   The CancerNext-Expanded + RNAinsight gene panel offered by [employer] and includes sequencing and rearrangement analysis for the following 77 genes: IP, ALK, APC*, ATM*, AXIN2, BAP1, BARD1, BLM, BMPR1A, BRCA1*, BRCA2*, BRIP1*, CDC73, CDH1*,CDK4, CDKN1B, CDKN2A, CHEK2*, CTNNA1, DICER1, FANCC, FH, FLCN, GALNT12, KIF1B, LZTR1, MAX, MEN1, MET, MLH1*, MSH2*, MSH3, MSH6*, MUTYH*, NBN, NF1*, NF2, NTHL1, PALB2*, PHOX2B, PMS2*, POT1, PRKAR1A, PTCH1, PTEN*, RAD51C*, RAD51D*,RB1, RECQL, RET, SDHA, SDHAF2, SDHB, SDHC, SDHD, SMAD4, SMARCA4, SMARCB1, SMARCE1, STK11, SUFU, TMEM127, TP53*,TSC1, TSC2, VHL and XRCC2 (sequencing and deletion/duplication); EGFR, EGLN1, HOXB13, KIT, MITF, PDGFRA, POLD1 and POLE (sequencing only); EPCAM and GREM1 (deletion/duplication only). DNA and RNA analyses performed for * genes.   11/18/2021 - 11/18/2021 Chemotherapy   Patient is on Treatment Plan : BREAST Trastuzumab (8/6) IV D1 + Capecitabine +  Tucatinib q21d     11/18/2021 - 12/09/2021 Chemotherapy   Patient is on Treatment Plan : BREAST Trastuzumab (8/6) IV D1 + Capecitabine +  Tucatinib q21d     12/26/2021 -  Chemotherapy   Patient is on Treatment Plan : BREAST Trastuzumab (8/6) IV D1 + Capecitabine +  Tucatinib q21d       ALLERGIES:  has No Known  Allergies.  MEDICATIONS:  Current Outpatient Medications  Medication Sig Dispense Refill   albuterol (VENTOLIN HFA) 108 (90 Base) MCG/ACT inhaler Inhale 2 puffs into the lungs every 6 (six) hours as needed for wheezing or shortness of breath. 8.5 g 2   benzoyl peroxide-erythromycin (BENZAMYCIN) gel Apply topically 2 (two) times daily. 46.6 g 0   Calcium Carb-Cholecalciferol 600-10 MG-MCG TABS Take 1 tablet by mouth 2 (two) times daily. 180 tablet 0   capecitabine (XELODA) 500 MG tablet Take 3 tablets (1,500 mg total) by mouth 2 (two) times daily after a meal. Take for 14 days, then hold for 7 days. Repeat every 21 days. 84 tablet 2   DULoxetine (CYMBALTA) 60 MG capsule Take 1 capsule (60 mg total) by mouth daily. 30 capsule 3   gabapentin (NEURONTIN) 600 MG tablet Take 1 tablet (600 mg total) by mouth 3 (three) times daily. 90 tablet 3   LORazepam (ATIVAN) 0.5 MG tablet Take 1 tablet (0.5 mg total) by mouth every 6 (six) hours as needed for anxiety (and nausea). AS NEEDED FOR NAUSEA 45 tablet 0   morphine (MS CONTIN) 15 MG 12 hr tablet Take 1 tablet (15 mg total) by mouth every 8 (eight) hours. 90 tablet 0   OLANZapine (ZYPREXA) 10 MG tablet Take 1 tablet (10  mg total) by mouth at bedtime as needed (nausea). 30 tablet 2   omeprazole (PRILOSEC) 20 MG capsule Take 1 capsule (20 mg total) by mouth daily. 30 capsule 3   ondansetron (ZOFRAN) 8 MG tablet Take 1 tablet (8 mg total) by mouth every 8 (eight) hours as needed for nausea or vomiting. 45 tablet 2   Oxycodone HCl 10 MG TABS Take 1/2 to 1 tablet (5-10 mg total) by mouth every 4 (four) hours as needed. 45 tablet 0   polyethylene glycol powder (MIRALAX) 17 GM/SCOOP powder Take 17 g by mouth daily as needed. 238 g 3   tucatinib (TUKYSA) 150 MG tablet Take 2 tablets (300 mg total) by mouth 2 (two) times daily. 120 tablet 3   butalbital-acetaminophen-caffeine (FIORICET) 50-325-40 MG tablet Take 1-2 tablets by mouth every 8 (eight) hours as needed for  headache. (Patient not taking: Reported on 04/24/2022) 30 tablet 1   diphenoxylate-atropine (LOMOTIL) 2.5-0.025 MG tablet Take 1 tablet by mouth 4 (four) times daily as needed for diarrhea or loose stools. (Patient not taking: Reported on 03/31/2022) 40 tablet 0   lidocaine-prilocaine (EMLA) cream Apply 1 Application topically as needed. Apply small amount to port site at least 1 hour prior to it being accessed, cover with plastic wrap (Patient not taking: Reported on 04/24/2022) 30 g 3   naloxone (NARCAN) nasal spray 4 mg/0.1 mL Administer one spray in nostril for opioid overdose. If unresponsive after 1 dose, may repeat a dose every 2-3 minutes in alternating nostrils until emergency services arrive. (Patient not taking: Reported on 03/31/2022) 2 each 3   No current facility-administered medications for this visit.   Facility-Administered Medications Ordered in Other Visits  Medication Dose Route Frequency Provider Last Rate Last Admin   0.9 %  sodium chloride infusion   Intravenous Continuous Jacquelin Hawking, NP   Stopped at 06/02/20 1453   heparin lock flush 100 unit/mL  500 Units Intravenous Once Sindy Guadeloupe, MD       heparin lock flush 100 unit/mL  500 Units Intravenous Once Sindy Guadeloupe, MD       sodium chloride flush (NS) 0.9 % injection 10 mL  10 mL Intravenous Once Sindy Guadeloupe, MD       sodium chloride flush (NS) 0.9 % injection 10 mL  10 mL Intravenous Once Sindy Guadeloupe, MD        VITAL SIGNS: BP 101/71   Pulse 80   Temp 98.1 F (36.7 C) (Tympanic)   Resp 20   Ht '5\' 2"'$  (1.575 m)   Wt 135 lb (61.2 kg)   BMI 24.69 kg/m  Filed Weights   04/24/22 1007  Weight: 135 lb (61.2 kg)    Estimated body mass index is 24.69 kg/m as calculated from the following:   Height as of this encounter: '5\' 2"'$  (1.575 m).   Weight as of this encounter: 135 lb (61.2 kg).  LABS: CBC:    Component Value Date/Time   WBC 5.4 04/21/2022 1040   HGB 13.0 04/21/2022 1040   HCT 36.9 04/21/2022  1040   PLT 350 04/21/2022 1040   MCV 90.9 04/21/2022 1040   NEUTROABS 3.3 04/21/2022 1040   LYMPHSABS 1.5 04/21/2022 1040   MONOABS 0.4 04/21/2022 1040   EOSABS 0.2 04/21/2022 1040   BASOSABS 0.0 04/21/2022 1040   Comprehensive Metabolic Panel:    Component Value Date/Time   NA 139 04/21/2022 1040   K 3.0 (L) 04/21/2022 1040   CL 109  04/21/2022 1040   CO2 22 04/21/2022 1040   BUN 10 04/21/2022 1040   CREATININE 0.57 04/21/2022 1040   GLUCOSE 86 04/21/2022 1040   CALCIUM 8.7 (L) 04/21/2022 1040   AST 26 04/21/2022 1040   ALT 16 04/21/2022 1040   ALKPHOS 74 04/21/2022 1040   BILITOT 0.5 04/21/2022 1040   PROT 6.9 04/21/2022 1040   ALBUMIN 3.9 04/21/2022 1040    RADIOGRAPHIC STUDIES: No results found.  PERFORMANCE STATUS (ECOG) : 3 - Symptomatic, >50% confined to bed  Review of Systems Unless otherwise noted, a complete review of systems is negative.  Physical Exam General: NAD. In a wheelchair.  Cardiovascular: regular rate and rhythm Pulmonary: clear ant fields Abdomen: soft, nontender, + bowel sounds GU: no suprapubic tenderness Extremities: no edema, no joint deformities Skin: no rashes MSK: Significant pain of the right proximal femur. Positive fracture sign of the femur. She is able to  Neurological: Weakness but otherwise nonfocal  Assessment and Plan- Patient is a 42 y.o. female    Encounter Diagnoses  Name Primary?   Right leg pain Yes   Right hip pain    Primary cancer of left breast with metastasis to other site [facility])    Multiple pathological fractures, initial encounter     New. Obvious concern for pathological fracture of the right femur. Patient is non-ambulatory with known metastatic involvement of this site with acute severe pain. I dicussed the option of obtaining an x ray however this would be offsite and a burden for patient plus would delay comfort measures. Instead we elected to have her be wheeled to the ER for work up of suspected femur  fracture secondary to metastatic disease.    Patient expressed understanding and was in agreement with this plan. She also understands that She can call clinic at any time with any questions, concerns, or complaints.   Thank you for allowing me to participate in the care of this very pleasant patient.   Visit consisted of counseling and education dealing with the complex and emotionally intense issues of symptom management in the setting of serious illness.Greater than 50%  of this time was spent counseling and coordinating care related to the above assessment and plan.  Signed by: Nelwyn Salisbury, PA-C

## 2022-04-24 NOTE — ED Notes (Signed)
Pt states that her leg is still hurting. Writer examined her leg again and didn't find any deformities, redness or swelling in area of pain. Pt advice to ice leg at home and to stay off of it to help healing. Pt told to follow up with her primary doctor and if the pain is much worse, to come back to the ED.

## 2022-04-24 NOTE — ED Triage Notes (Signed)
Patient sent to ED from cancer center.  Patient with metastatic breast cancer.  C/O right femur pain.  Patient states pain started yesterday, no injury.  Unable to bear weight on leg since pain started.

## 2022-04-24 NOTE — ED Triage Notes (Signed)
CA patient with RIGHT hip and RIGHT upper leg pain - patient states that she fell 3 weeks ago and has been having ongoing pain since the fall; EMR shows "history of impending fracture of the right hip however she underwent prophylactic fixation of the right hip 2021. Last bone scan on 01/31/2022 showed "new focus of abnormal radiotracer uptake in the distal right femoral metaphysis".

## 2022-04-24 NOTE — ED Provider Notes (Signed)
[facility] Provider Note    Event Date/Time   First MD Initiated Contact with Patient 04/24/22 1143     (approximate)  History   Chief Complaint: Hip Pain ([place] patient with RIGHT hip and RIGHT upper leg pain - patient states that she fell 3 weeks ago and has been having ongoing pain since the fall; EMR shows "history of impending fracture of the right hip however she underwent prophylactic fixation of the right hip 2021. Last bone scan on 01/31/2022 showed "new focus of abnormal radiotracer uptake in the distal right femoral metaphysis".)  HPI  Sophia [name] is a 42 y.o. female with a past medical history of metastatic breast cancer who presents to the emergency department for right leg pain.  According to patient approximately 3 weeks ago she did have a fall landing on her buttocks.  Patient states she has had some slight pain ever since in her right hip and leg, although the patient states that has been very mild.  However yesterday she states she tried to get up and experienced severe pain in the right leg into the right pelvis.  Patient states since that time she has not been able to stand or walk on the right leg due to pain.  Denies any other trauma or falls.  Patient states mild pain at rest however significant pain with any attempted movement.  Physical Exam   Triage Vital Signs: ED Triage Vitals  Enc Vitals Group     BP 04/24/22 1113 99/82     Pulse Rate 04/24/22 1113 85     Resp 04/24/22 1113 19     Temp 04/24/22 1113 98.5 F (36.9 C)     Temp Source 04/24/22 1113 Oral     SpO2 04/24/22 1113 97 %     Weight --      Height --      Head Circumference --      Peak Flow --      Pain Score 04/24/22 1114 10     Pain Loc --      Pain Edu? --      Excl. in [place]? --     Most recent vital signs: Vitals:   04/24/22 1113  BP: 99/82  Pulse: 85  Resp: 19  Temp: 98.5 F (36.9 C)  SpO2: 97%    General: Awake, no distress.  CV:  Good  peripheral perfusion.  Regular rate and rhythm  Resp:  Normal effort.  Equal breath sounds bilaterally.  Abd:  No distention.  Soft, nontender.  No rebound or guarding.  ED Results / Procedures / Treatments   RADIOLOGY  I reviewed the x-ray images I do not see any obvious fracture on my evaluation. I have reviewed the CT images hide again I do not see any obvious fracture on my evaluation. Radiology has read the CT scan as negative for acute fracture.  Hardware intact and appropriately positioned.  MEDICATIONS ORDERED IN ED: Medications - No data to display   IMPRESSION / MDM / [place] / ED COURSE  I reviewed the triage vital signs and the nursing notes.  Patient's presentation is most consistent with acute presentation with potential threat to life or bodily function.  Patient presents emergency department for right leg pain.  Patient has a history of metastatic breast cancer.  Does state a fall 3 weeks ago but no trauma since.  States the pain is gone to the point where she is no longer  able to stand or ambulate on that leg.  Differential would include fracture, pathologic fracture, bony metastases, muscle tear/strain.  Neurovascular intact on exam.  No concern for dislocation with good range of motion although with pain elicited more so with active range of motion and passive but pain with both.  X-rays and CT scan show no concerning finding.  Patient states her pain is somewhat better after the Dilaudid.  I recommended that the patient use a walker at home.  States she will call her doctor today to inform them of our findings and her trouble walking due to increased pain.  FINAL CLINICAL IMPRESSION(S) / ED DIAGNOSES   Right leg pain   Note:  This document was prepared using Dragon voice recognition software and may include unintentional dictation errors.   Harvest Dark, MD 04/24/22 1520

## 2022-04-24 NOTE — Discharge Instructions (Signed)
As we discussed please talk to your oncologist today regarding your right leg pain and today's ER visit.  Your CT scan of your right hip and femur showed no concerning findings.

## 2022-04-25 ENCOUNTER — Telehealth: Payer: Self-pay | Admitting: Hospice and Palliative Medicine

## 2022-04-25 ENCOUNTER — Telehealth: Payer: Self-pay | Admitting: *Deleted

## 2022-04-25 NOTE — Telephone Encounter (Signed)
Pt called and said that she is still having  pain. She is on oxycodone and MS contin. . I spoke to Janese Banks and she she can take 1 tablet 10 mg every 4 hours  and she is already do that. I called the pt. Back and got her voicemail this time and left her a message that pt can take 2 10 mg tablets every 4 4hours and I will sent [name] a message to call tom. And see what things we can change  to help pt

## 2022-04-26 ENCOUNTER — Inpatient Hospital Stay (HOSPITAL_BASED_OUTPATIENT_CLINIC_OR_DEPARTMENT_OTHER): Payer: Self-pay | Admitting: Hospice and Palliative Medicine

## 2022-04-26 DIAGNOSIS — M25551 Pain in right hip: Secondary | ICD-10-CM

## 2022-04-26 DIAGNOSIS — C50912 Malignant neoplasm of unspecified site of left female breast: Secondary | ICD-10-CM

## 2022-04-26 NOTE — Progress Notes (Signed)
Virtual Visit via Telephone Note  I connected with Sophia [name] on 04/26/22 at  1:35 PM EST by telephone and verified that I am speaking with the correct person using two identifiers.  Location: Patient: Home Provider: Clinic   I discussed the limitations, risks, security and privacy concerns of performing an evaluation and management service by telephone and the availability of in person appointments. I also discussed with the patient that there may be a patient responsible charge related to this service. The patient expressed understanding and agreed to proceed.   History of Present Illness: Sophia [name] is a 42 y.o. 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 MRI of the brain on 02/08/2021 revealed widespread metastatic disease to the brain with mild associated edema and small area of hemorrhage in the right frontal.  Patient is status post whole brain radiation.  She completed Taxol and iwas on Herceptin and Perjeta maintenance.  She is now on trastuzumab with Xeloda and tucatinib.    Observations/Objective: Patient called into the clinic reporting ongoing right hip pain.  I spoke with patient by phone.  Patient was seen in the ED on 04/24/2022 for right hip pain following a fall several weeks ago.  Ambulation has become more difficult due to pain.  Patient has history of pathologic fracture of the right hip requiring pinning in 2021.  Patient underwent x-ray and CT of the hip without any acute findings.  Pain improved somewhat with hydromorphone.  Today, patient continues to endorse ongoing and persistent pain in the right hip affecting ambulation.  Discussed her pain regimen in detail.  She has both MS Contin and oxycodone IR ordered for pain.  However, patient says that she is taking MS Contin twice daily instead of TI dosing as ordered.  She is only taking oxycodone IR every 6 hours instead  of every 4 hours as directed.  We discussed her liberalizing her pain regimen and increasing MS Contin to every 8 hours and she can take oxycodone IR 1-2 tablets every 4 hours as needed for pain.  Patient repeated back directions.  She has pending bone scan and we will see if we could get this done sooner.  Assessment and Plan: Neoplasm related pain -maximize use of MS Contin/oxycodone IR for pain.  Continue gabapentin and duloxetine.  Will continue rotating opioid regimen if needed.  Will see if we can obtain bone scan sooner.  Discussed with Dr. Rudene Christians and Dr. Janese Banks.   Follow Up Instructions: Follow-up telephone visit 1 week   I discussed the assessment and treatment plan with the patient. The patient was provided an opportunity to ask questions and all were answered. The patient agreed with the plan and demonstrated an understanding of the instructions.   The patient was advised to call back or seek an in-person evaluation if the symptoms worsen or if the condition fails to improve as anticipated.  I provided 10 minutes of non-face-to-face time during this encounter.   Irean Hong, NP

## 2022-04-27 ENCOUNTER — Other Ambulatory Visit (HOSPITAL_COMMUNITY): Payer: Self-pay

## 2022-04-27 ENCOUNTER — Ambulatory Visit
Admission: RE | Admit: 2022-04-27 | Discharge: 2022-04-27 | Disposition: A | Discharge status: Home / Self Care | Payer: Self-pay | Source: Ambulatory Visit | Attending: Oncology | Admitting: Oncology

## 2022-04-27 ENCOUNTER — Encounter: Payer: Self-pay | Admitting: Oncology

## 2022-04-27 ENCOUNTER — Other Ambulatory Visit: Payer: Self-pay | Admitting: *Deleted

## 2022-04-27 ENCOUNTER — Other Ambulatory Visit: Payer: Self-pay | Admitting: Hospice and Palliative Medicine

## 2022-04-27 ENCOUNTER — Other Ambulatory Visit: Payer: Self-pay

## 2022-04-27 ENCOUNTER — Telehealth: Payer: Self-pay | Admitting: Pharmacist

## 2022-04-27 ENCOUNTER — Inpatient Hospital Stay: Payer: Self-pay

## 2022-04-27 VITALS — BP 121/96 | HR 101 | Temp 96.8°F | Resp 19

## 2022-04-27 DIAGNOSIS — M8440XA Pathological fracture, unspecified site, initial encounter for fracture: Secondary | ICD-10-CM | POA: Insufficient documentation

## 2022-04-27 DIAGNOSIS — C50912 Malignant neoplasm of unspecified site of left female breast: Secondary | ICD-10-CM

## 2022-04-27 DIAGNOSIS — M25551 Pain in right hip: Secondary | ICD-10-CM

## 2022-04-27 MED ORDER — HEPARIN SOD (PORK) LOCK FLUSH 100 UNIT/ML IV SOLN
500.0000 [IU] | Freq: Once | INTRAVENOUS | Status: AC
  Administered 2022-04-27: 500 [IU] via INTRAVENOUS
  Filled 2022-04-27: qty 5

## 2022-04-27 MED ORDER — MORPHINE SULFATE (PF) 2 MG/ML IV SOLN
6.0000 mg | Freq: Once | INTRAVENOUS | Status: AC
  Administered 2022-04-27: 6 mg via INTRAVENOUS
  Filled 2022-04-27: qty 3

## 2022-04-27 MED ORDER — TECHNETIUM TC 99M MEDRONATE IV KIT
20.0000 | PACK | Freq: Once | INTRAVENOUS | Status: AC | PRN
  Administered 2022-04-27: 21.1 via INTRAVENOUS

## 2022-04-27 MED ORDER — HYDROMORPHONE HCL 2 MG PO TABS
1.0000 mg | ORAL_TABLET | ORAL | 0 refills | Status: DC | PRN
  Filled 2022-04-27: qty 45, 8d supply, fill #0

## 2022-04-27 MED ORDER — SODIUM CHLORIDE 0.9 % IV SOLN
INTRAVENOUS | Status: DC
  Filled 2022-04-27: qty 250

## 2022-04-27 MED ORDER — SODIUM CHLORIDE 0.9% FLUSH
10.0000 mL | Freq: Once | INTRAVENOUS | Status: AC
  Administered 2022-04-27: 10 mL via INTRAVENOUS
  Filled 2022-04-27: qty 10

## 2022-04-27 MED ORDER — HYDROMORPHONE HCL 1 MG/ML IJ SOLN
1.0000 mg | Freq: Once | INTRAMUSCULAR | Status: AC
  Administered 2022-04-27: 1 mg via INTRAVENOUS
  Filled 2022-04-27: qty 1

## 2022-04-27 MED ORDER — SODIUM CHLORIDE 0.9 % IV SOLN
10.0000 mg | Freq: Once | INTRAVENOUS | Status: AC
  Administered 2022-04-27: 10 mg via INTRAVENOUS
  Filled 2022-04-27: qty 10

## 2022-04-27 NOTE — Addendum Note (Signed)
Addended by: Luella Cook on: 04/27/2022 03:13 PM   Modules accepted: Orders

## 2022-04-27 NOTE — Telephone Encounter (Signed)
Received call from [place] to set-up delivery of patient tucatinib. Expected delivery date on 04/27/22. Medication will be delivered to the cancer center to be given to patient due to housing issues.

## 2022-04-27 NOTE — Telephone Encounter (Signed)
Per [employer] (Specialty) patient picked up Xeloda from [place] on 04/21/22 along with her other medications. As of today, she has two cycles worth of Xeloda on hand.

## 2022-04-27 NOTE — Addendum Note (Signed)
Addended by: Luella Cook on: 04/27/2022 01:39 PM   Modules accepted: Orders

## 2022-04-27 NOTE — Progress Notes (Signed)
Patient still with severe and persistent pain.  Will rotate from oxycodone to oral hydromorphone as needed for breakthrough pain.  Rx sent to pharmacy.  Continue MS Contin.

## 2022-04-28 ENCOUNTER — Other Ambulatory Visit: Payer: Self-pay | Admitting: *Deleted

## 2022-04-28 ENCOUNTER — Other Ambulatory Visit: Payer: Self-pay

## 2022-04-28 ENCOUNTER — Encounter: Payer: Self-pay | Admitting: Oncology

## 2022-04-28 ENCOUNTER — Inpatient Hospital Stay: Payer: Self-pay | Admitting: Hospice and Palliative Medicine

## 2022-04-28 ENCOUNTER — Inpatient Hospital Stay: Payer: Self-pay

## 2022-04-28 ENCOUNTER — Telehealth: Payer: Self-pay | Admitting: *Deleted

## 2022-04-28 MED ORDER — BUTALBITAL-APAP-CAFFEINE 50-325-40 MG PO TABS
1.0000 | ORAL_TABLET | Freq: Three times a day (TID) | ORAL | 1 refills | Status: DC | PRN
  Filled 2022-04-28: qty 30, 10d supply, fill #0

## 2022-04-28 NOTE — Telephone Encounter (Signed)
Pt called stating that pt needs her HA medicine fioricet. She just had it 2/2 but if she used it 2 tab. Every 8 hours if needed then she could run out in 5 days. Dr. Janese Banks sais she can have another refill but this time she should only take 1 at a time every 8 hours as needed. She will come nad get the med. And also. Dr Janese Banks wanted her to come on Monday to see pt in person to make sure she understands the results.. pt coming 11 am on monday

## 2022-05-01 ENCOUNTER — Inpatient Hospital Stay (HOSPITAL_BASED_OUTPATIENT_CLINIC_OR_DEPARTMENT_OTHER): Payer: Self-pay | Admitting: Oncology

## 2022-05-01 ENCOUNTER — Inpatient Hospital Stay: Payer: Self-pay

## 2022-05-01 ENCOUNTER — Other Ambulatory Visit: Payer: Self-pay | Admitting: *Deleted

## 2022-05-01 ENCOUNTER — Encounter: Payer: Self-pay | Admitting: Oncology

## 2022-05-01 ENCOUNTER — Telehealth: Payer: Self-pay | Admitting: Hospice and Palliative Medicine

## 2022-05-01 VITALS — BP 110/73 | HR 73 | Temp 98.7°F | Resp 20 | Wt 132.0 lb

## 2022-05-01 DIAGNOSIS — C50912 Malignant neoplasm of unspecified site of left female breast: Secondary | ICD-10-CM

## 2022-05-01 DIAGNOSIS — C7951 Secondary malignant neoplasm of bone: Secondary | ICD-10-CM

## 2022-05-01 DIAGNOSIS — Z7189 Other specified counseling: Secondary | ICD-10-CM

## 2022-05-01 DIAGNOSIS — G893 Neoplasm related pain (acute) (chronic): Secondary | ICD-10-CM

## 2022-05-01 DIAGNOSIS — M25551 Pain in right hip: Secondary | ICD-10-CM

## 2022-05-01 MED ORDER — HEPARIN SOD (PORK) LOCK FLUSH 100 UNIT/ML IV SOLN
INTRAVENOUS | Status: AC
  Administered 2022-05-01: 500 [IU]
  Filled 2022-05-01: qty 5

## 2022-05-01 MED ORDER — DENOSUMAB 120 MG/1.7ML ~~LOC~~ SOLN
120.0000 mg | Freq: Once | SUBCUTANEOUS | Status: AC
  Administered 2022-05-01: 120 mg via SUBCUTANEOUS
  Filled 2022-05-01: qty 1.7

## 2022-05-01 NOTE — Progress Notes (Signed)
Ok to give xgeva w/ Calcium of 8.7 per MD Janese Banks.  Larene Beach, PharmD

## 2022-05-01 NOTE — Patient Instructions (Signed)
Denosumab Injection (Oncology) What is this medication? DENOSUMAB (den oh SUE mab) prevents weakened bones caused by cancer. It may also be used to treat noncancerous bone tumors that cannot be removed by surgery. It can also be used to treat high calcium levels in the blood caused by cancer. It works by blocking a protein that causes bones to break down quickly. This slows down the release of calcium from bones, which lowers calcium levels in your blood. It also makes your bones stronger and less likely to break (fracture). This medicine may be used for other purposes; ask your health care provider or pharmacist if you have questions. COMMON BRAND NAME(S): XGEVA What should I tell my care team before I take this medication? They need to know if you have any of these conditions: Dental disease Having surgery or tooth extraction Infection Kidney disease Low levels of calcium or vitamin D in the blood Malnutrition On hemodialysis Skin conditions or sensitivity Thyroid or parathyroid disease An unusual reaction to denosumab, other medications, foods, dyes, or preservatives Pregnant or trying to get pregnant Breast-feeding How should I use this medication? This medication is for injection under the skin. It is given by your care team in a hospital or clinic setting. A special MedGuide will be given to you before each treatment. Be sure to read this information carefully each time. Talk to your care team about the use of this medication in children. While it may be prescribed for children as young as 13 years for selected conditions, precautions do apply. Overdosage: If you think you have taken too much of this medicine contact a poison control center or emergency room at once. NOTE: This medicine is only for you. Do not share this medicine with others. What if I miss a dose? Keep appointments for follow-up doses. It is important not to miss your dose. Call your care team if you are unable to  keep an appointment. What may interact with this medication? Do not take this medication with any of the following: Other medications containing denosumab This medication may also interact with the following: Medications that lower your chance of fighting infection Steroid medications, such as prednisone or cortisone This list may not describe all possible interactions. Give your health care provider a list of all the medicines, herbs, non-prescription drugs, or dietary supplements you use. Also tell them if you smoke, drink alcohol, or use illegal drugs. Some items may interact with your medicine. What should I watch for while using this medication? Your condition will be monitored carefully while you are receiving this medication. You may need blood work while taking this medication. This medication may increase your risk of getting an infection. Call your care team for advice if you get a fever, chills, sore throat, or other symptoms of a cold or flu. Do not treat yourself. Try to avoid being around people who are sick. You should make sure you get enough calcium and vitamin D while you are taking this medication, unless your care team tells you not to. Discuss the foods you eat and the vitamins you take with your care team. Some people who take this medication have severe bone, joint, or muscle pain. This medication may also increase your risk for jaw problems or a broken thigh bone. Tell your care team right away if you have severe pain in your jaw, bones, joints, or muscles. Tell your care team if you have any pain that does not go away or that gets worse. Talk   to your care team if you may be pregnant. Serious birth defects can occur if you take this medication during pregnancy and for 5 months after the last dose. You will need a negative pregnancy test before starting this medication. Contraception is recommended while taking this medication and for 5 months after the last dose. Your care team  can help you find the option that works for you. What side effects may I notice from receiving this medication? Side effects that you should report to your care team as soon as possible: Allergic reactions--skin rash, itching, hives, swelling of the face, lips, tongue, or throat Bone, joint, or muscle pain Low calcium level--muscle pain or cramps, confusion, tingling, or numbness in the hands or feet Osteonecrosis of the jaw--pain, swelling, or redness in the mouth, numbness of the jaw, poor healing after dental work, unusual discharge from the mouth, visible bones in the mouth Side effects that usually do not require medical attention (report to your care team if they continue or are bothersome): Cough Diarrhea Fatigue Headache Nausea This list may not describe all possible side effects. Call your doctor for medical advice about side effects. You may report side effects to FDA at 1-800-FDA-1088. Where should I keep my medication? This medication is given in a hospital or clinic. It will not be stored at home. NOTE: This sheet is a summary. It may not cover all possible information. If you have questions about this medicine, talk to your doctor, pharmacist, or health care provider.  2023 [employer]vier/Gold Standard (2021-07-25 00:00:00)  

## 2022-05-02 ENCOUNTER — Other Ambulatory Visit: Payer: Self-pay

## 2022-05-02 ENCOUNTER — Telehealth: Payer: Self-pay | Admitting: *Deleted

## 2022-05-02 ENCOUNTER — Encounter: Payer: Self-pay | Admitting: Oncology

## 2022-05-02 ENCOUNTER — Other Ambulatory Visit: Payer: Self-pay | Admitting: *Deleted

## 2022-05-02 MED ORDER — MORPHINE SULFATE ER 15 MG PO TBCR
15.0000 mg | EXTENDED_RELEASE_TABLET | Freq: Three times a day (TID) | ORAL | 0 refills | Status: DC
  Filled 2022-05-02: qty 90, 30d supply, fill #0

## 2022-05-02 NOTE — Telephone Encounter (Signed)
I asked the patient if she looked all over the house and asked the kids if they have seen any pill bottles. She did ask and they have not seen it. I told her that she has to put it in safe place and keep all of them in the same place so she does not loose it again. She is agreeable for this. We sent another rx today.

## 2022-05-03 ENCOUNTER — Inpatient Hospital Stay (HOSPITAL_BASED_OUTPATIENT_CLINIC_OR_DEPARTMENT_OTHER): Payer: Self-pay | Admitting: Hospice and Palliative Medicine

## 2022-05-03 ENCOUNTER — Other Ambulatory Visit: Payer: Self-pay

## 2022-05-03 DIAGNOSIS — C7951 Secondary malignant neoplasm of bone: Secondary | ICD-10-CM

## 2022-05-03 DIAGNOSIS — M25551 Pain in right hip: Secondary | ICD-10-CM

## 2022-05-03 MED ORDER — MORPHINE SULFATE ER 15 MG PO TBCR: 30.0000 mg | EXTENDED_RELEASE_TABLET | Freq: Two times a day (BID) | ORAL | 0 refills | Status: DC

## 2022-05-03 NOTE — Progress Notes (Signed)
Virtual Visit via Telephone Note  I connected with [name] on 05/03/22 at  3:00 PM EST by telephone and verified that I am speaking with the correct person using two identifiers.  Location: Patient: Home Provider: Clinic   I discussed the limitations, risks, security and privacy concerns of performing an evaluation and management service by telephone and the availability of in person appointments. I also discussed with the patient that there may be a patient responsible charge related to this service. The patient expressed understanding and agreed to proceed.   History of Present Illness: Sophia [name] is a 42 y.o. 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 MRI of the brain on 02/08/2021 revealed widespread metastatic disease to the brain with mild associated edema and small area of hemorrhage in the right frontal.  Patient is status post whole brain radiation.  She completed Taxol and was on Herceptin and Perjeta maintenance.  She is now on trastuzumab with Xeloda and tucatinib.    Observations/Objective: Follow-up call with patient regarding pain.  Bone scan on 04/27/2022 showed evidence of progressed osseous metastatic disease with new activity in the distal left femoral shaft and increased activity in both femoral condyles.  Patient had progressed radiotracer activity in the proximal right femur, which is the site of her worsened pain.  Today, patient says that she has had some improvement after initiation of oral hydromorphone.  However, she says that the relief is short-lived.  She says that the pain is reasonably well-controlled for an hour or two but then wears off and the pain again becomes poorly controlled.  As patient is reporting frequent breakthrough pain, will liberalize dosing of MS Contin.  Discussed increasing MS Contin to 30 mg every 12 hours.  Patient can use two 67m  tablets to equal this dose.  We can consider further increasing MS Contin to 30 mg every 8 hours if needed.  Patient will continue hydromorphone for breakthrough pain.  Patient verbalized understanding and was able to repeat back instructions.  Assessment and Plan: Neoplasm related pain -increase MS Contin to 30 mg every 12 hours.  Continue hydromorphone as needed for breakthrough pain.  Continue daily bowel regimen.  Case and plan discussed with Dr. RJanese Banks Follow Up Instructions: Follow-up telephone visit 1 week   I discussed the assessment and treatment plan with the patient. The patient was provided an opportunity to ask questions and all were answered. The patient agreed with the plan and demonstrated an understanding of the instructions.   The patient was advised to call back or seek an in-person evaluation if the symptoms worsen or if the condition fails to improve as anticipated.  I provided 10 minutes of non-face-to-face time during this encounter.   JIrean Hong NP

## 2022-05-04 ENCOUNTER — Encounter: Payer: Self-pay | Admitting: Oncology

## 2022-05-04 ENCOUNTER — Inpatient Hospital Stay: Payer: Self-pay

## 2022-05-04 ENCOUNTER — Other Ambulatory Visit: Payer: Self-pay

## 2022-05-04 ENCOUNTER — Ambulatory Visit
Admission: RE | Admit: 2022-05-04 | Discharge: 2022-05-04 | Disposition: A | Discharge status: Home / Self Care | Payer: Self-pay | Source: Ambulatory Visit | Attending: Oncology | Admitting: Oncology

## 2022-05-04 DIAGNOSIS — Z0189 Encounter for other specified special examinations: Secondary | ICD-10-CM

## 2022-05-04 DIAGNOSIS — I340 Nonrheumatic mitral (valve) insufficiency: Secondary | ICD-10-CM | POA: Insufficient documentation

## 2022-05-04 DIAGNOSIS — M8440XA Pathological fracture, unspecified site, initial encounter for fracture: Secondary | ICD-10-CM | POA: Insufficient documentation

## 2022-05-04 DIAGNOSIS — C50919 Malignant neoplasm of unspecified site of unspecified female breast: Secondary | ICD-10-CM | POA: Insufficient documentation

## 2022-05-04 LAB — ECHOCARDIOGRAM COMPLETE
AR max vel: 2.31 cm2
AV Area VTI: 2.35 cm2
AV Area mean vel: 2.19 cm2
AV Mean grad: 4 mmHg
AV Peak grad: 6.8 mmHg
Ao pk vel: 1.3 m/s
Area-P 1/2: 4.19 cm2
MV VTI: 2.7 cm2
S' Lateral: 2.5 cm

## 2022-05-04 MED ORDER — MORPHINE SULFATE ER 30 MG PO TBCR
30.0000 mg | EXTENDED_RELEASE_TABLET | Freq: Two times a day (BID) | ORAL | 0 refills | Status: DC
  Filled 2022-05-04: qty 60, 30d supply, fill #0

## 2022-05-04 NOTE — Progress Notes (Signed)
*  PRELIMINARY RESULTS* Echocardiogram 2D Echocardiogram has been performed.  Sophia [name] 05/04/2022, 12:07 PM

## 2022-05-04 NOTE — Addendum Note (Signed)
Addended by: Irean Hong on: 05/04/2022 09:27 AM   Modules accepted: Orders

## 2022-05-05 ENCOUNTER — Ambulatory Visit
Admission: RE | Admit: 2022-05-05 | Discharge: 2022-05-05 | Disposition: A | Discharge status: Home / Self Care | Payer: Self-pay | Source: Ambulatory Visit | Attending: Oncology | Admitting: Oncology

## 2022-05-05 DIAGNOSIS — M8440XA Pathological fracture, unspecified site, initial encounter for fracture: Secondary | ICD-10-CM

## 2022-05-05 MED ORDER — GADOBUTROL 1 MMOL/ML IV SOLN
6.0000 mL | Freq: Once | INTRAVENOUS | Status: AC | PRN
  Administered 2022-05-05: 5 mL via INTRAVENOUS

## 2022-05-05 MED ORDER — IOHEXOL 300 MG/ML  SOLN
100.0000 mL | Freq: Once | INTRAMUSCULAR | Status: AC | PRN
  Administered 2022-05-05: 100 mL via INTRAVENOUS

## 2022-05-08 ENCOUNTER — Encounter: Payer: Self-pay | Admitting: Oncology

## 2022-05-08 ENCOUNTER — Other Ambulatory Visit: Payer: Self-pay | Admitting: *Deleted

## 2022-05-08 ENCOUNTER — Other Ambulatory Visit: Payer: Self-pay

## 2022-05-08 MED ORDER — LORAZEPAM 0.5 MG PO TABS
0.5000 mg | ORAL_TABLET | Freq: Four times a day (QID) | ORAL | 0 refills | Status: DC | PRN
  Filled 2022-05-08: qty 60, 15d supply, fill #0

## 2022-05-08 MED ORDER — ONDANSETRON HCL 8 MG PO TABS
8.0000 mg | ORAL_TABLET | Freq: Three times a day (TID) | ORAL | 2 refills | Status: DC | PRN
  Filled 2022-05-08: qty 45, 15d supply, fill #0

## 2022-05-08 MED ORDER — HYDROMORPHONE HCL 2 MG PO TABS
1.0000 mg | ORAL_TABLET | ORAL | 0 refills | Status: DC | PRN
  Filled 2022-05-08: qty 60, 10d supply, fill #0

## 2022-05-09 ENCOUNTER — Other Ambulatory Visit: Payer: Self-pay | Admitting: *Deleted

## 2022-05-09 ENCOUNTER — Telehealth: Payer: Self-pay | Admitting: *Deleted

## 2022-05-09 DIAGNOSIS — R300 Dysuria: Secondary | ICD-10-CM

## 2022-05-09 NOTE — Telephone Encounter (Signed)
Called the pt back and she has burning on urination. Her urine is darker color. Does not hurt until  she urinates. She will be on van 10:45 to 11 am and urine test at 11:30. She is agreeable

## 2022-05-10 ENCOUNTER — Encounter: Payer: Self-pay | Admitting: Oncology

## 2022-05-10 ENCOUNTER — Inpatient Hospital Stay (HOSPITAL_BASED_OUTPATIENT_CLINIC_OR_DEPARTMENT_OTHER): Payer: Self-pay | Admitting: Hospice and Palliative Medicine

## 2022-05-10 ENCOUNTER — Other Ambulatory Visit (HOSPITAL_COMMUNITY): Payer: Self-pay

## 2022-05-10 ENCOUNTER — Inpatient Hospital Stay: Payer: Self-pay

## 2022-05-10 ENCOUNTER — Telehealth: Payer: Self-pay | Admitting: *Deleted

## 2022-05-10 ENCOUNTER — Other Ambulatory Visit: Payer: Self-pay

## 2022-05-10 DIAGNOSIS — G893 Neoplasm related pain (acute) (chronic): Secondary | ICD-10-CM

## 2022-05-10 DIAGNOSIS — R300 Dysuria: Secondary | ICD-10-CM

## 2022-05-10 LAB — URINALYSIS, COMPLETE (UACMP) WITH MICROSCOPIC
Bilirubin Urine: NEGATIVE
Glucose, UA: NEGATIVE mg/dL
Ketones, ur: NEGATIVE mg/dL
Nitrite: NEGATIVE
Protein, ur: 100 mg/dL — AB
Specific Gravity, Urine: 1.017 (ref 1.005–1.030)
Squamous Epithelial / HPF: NONE SEEN /HPF (ref 0–5)
WBC, UA: >50 WBC/hpf (ref 0–5)
pH: 5 (ref 5.0–8.0)

## 2022-05-10 MED ORDER — NITROFURANTOIN MONOHYD MACRO 100 MG PO CAPS
100.0000 mg | ORAL_CAPSULE | Freq: Two times a day (BID) | ORAL | 0 refills | Status: DC
  Filled 2022-05-10: qty 10, 5d supply, fill #0

## 2022-05-10 NOTE — Progress Notes (Signed)
Hematology/Oncology Consult note [facility]  Telephone:([phone] [phone]  Patient Care Team: Sindy Guadeloupe, MD as PCP - General (Oncology) Rico Junker, RN as Registered Nurse Theodore Demark, RN (Inactive) as Registered Nurse Sindy Guadeloupe, MD as Consulting Physician (Hematology and Oncology)   Name of the patient: Sophia [name]  [id number]  January 01, 1981   Date of visit: 05/10/22  Diagnosis- stage IV metastatic breast cancer ER/PR negative HER-2/neu positive with bone metastases     Chief complaint/ Reason for visit-discuss bone scan results and further management  Heme/Onc history: patient is a 1 year old Hispanic female who is here with her friend.  History obtained with the help of an interpreter.Patient self palpated left breast mass which was followed by a diagnostic bilateral mammogram.  Mammogram showed 3.1 x 2.9 x 1.9 cm hypoechoic mass at the 1 o'clock position of the left breast.  For abnormal cortically thickened left axillary lymph nodes measuring up to 5 mm.  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  Patient was also having ongoing back pain and was seen by [facility]oped[facility] Dr. Doyle Askew who ordered MRI lumbar spine without contrast which showed possible pathologic fractures of L1 and L4 vertebral bodies with greater than 50% height loss at L1 and abnormal signal involving L2-L3 S1 as well as right iliac bone concerning for metastatic disease.  Patient is a single mother of 3 adult children and is very anxious today.  She reports significant back pain which radiates to her bilateral thighs.  Denies any focal tingling numbness or weakness.  Denies any bowel bladder incontinence.  Pain has been uncontrolled despite taking Tylenol.  No prior history of abnormal breast biopsies.  No family history of breast cancer    PET and MRI showed 3 areas of pathologic fracture of her spine as well as widespread bony metastatic disease and concern for impending fracture of the right hip.  Given her worsening pain she was asked to come to the ER.  She has been evaluated by Dr. Rudene Christians from orthopedic surgery and underwent kyphoplasty at 3 different levels. T6 L1 and L4 along with radiofrequency ablation.    She also underwent prophylactic fixation of the right hip and not affected the sacral region.     Patient received first dose of Herceptin and Perjeta on 09/04/2019.  Patient initially received Taxol and Herceptin perjeta.  Presently patient is on maintenance Herceptin and Perjeta alone.  Patient found to have brain mets in November 2022 for which she received whole brain radiation treatment but there was no evidence of systemic progression.  She was continued on Herceptin and Perjeta.  Repeat MRI in June 2023 showed slight progression of brain mets.  Patient was switched to Herceptin tucatinib Xeloda      Interval history-patient was sent to the ER due to acute onset of right hip pain.  Presently she is on as needed Dilaudid and pain is better but persists.  She is able to ambulate with difficulty.  ECOG PS- 2 Pain scale- 4 Opioid associated constipation- no  Review of systems- Review of Systems  Constitutional:  Positive for malaise/fatigue. Negative for chills, fever and weight loss.  HENT:  Negative for congestion, ear discharge and nosebleeds.   Eyes:  Negative for blurred vision.  Respiratory:  Negative for cough, hemoptysis, sputum production, shortness of breath  and wheezing.   Cardiovascular:  Negative for chest pain, palpitations, orthopnea and claudication.  Gastrointestinal:  Negative for abdominal pain, blood in stool, constipation, diarrhea, heartburn, melena, nausea and vomiting.  Genitourinary:  Negative for dysuria, flank pain, frequency, hematuria and urgency.  Musculoskeletal:  Positive for joint pain  (Right hip pain). Negative for back pain and myalgias.  Skin:  Negative for rash.  Neurological:  Negative for dizziness, tingling, focal weakness, seizures, weakness and headaches.  Endo/Heme/Allergies:  Does not bruise/bleed easily.  Psychiatric/Behavioral:  Negative for depression and suicidal ideas. The patient does not have insomnia.       No Known Allergies   Past Medical History:  Diagnosis Date   Anxiety    Breast cancer ([place])    with mets   Cancer ([place])    Colitis    COVID-19 in immunocompromised patient ([place])    Family history of colon cancer    Vertigo      Past Surgical History:  Procedure Laterality Date   BREAST BIOPSY Left 08/14/2019   [place] bx of mass, coil marker, path pending   BREAST BIOPSY Left 08/14/2019   [place] bx of LN, hydromarker, path pending   BREAST BIOPSY Left 08/14/2019   affirm bx of calcs, x marker, path pending   ESOPHAGOGASTRODUODENOSCOPY (EGD) WITH PROPOFOL N/A 10/05/2019   Procedure: ESOPHAGOGASTRODUODENOSCOPY (EGD) WITH PROPOFOL;  Surgeon: Lin Landsman, MD;  Location: [place];  Service: Gastroenterology;  Laterality: N/A;   FLEXIBLE SIGMOIDOSCOPY N/A 10/05/2019   Procedure: FLEXIBLE SIGMOIDOSCOPY;  Surgeon: Lin Landsman, MD;  Location: [facility];  Service: Gastroenterology;  Laterality: N/A;   INTRAMEDULLARY (IM) NAIL INTERTROCHANTERIC Right 09/01/2019   Procedure: INTRAMEDULLARY (IM) NAIL INTERTROCHANTRIC AND RADIOFREQUENCY ABLATION;  Surgeon: Hessie Knows, MD;  Location: [facility] ORS;  Service: Orthopedics;  Laterality: Right;   KYPHOPLASTY N/A 08/29/2019   Procedure: KYPHOPLASTY T6, L1,L4 ,  RADIOFREQUENCY ABLATION;  Surgeon: Hessie Knows, MD;  Location: [facility] OR[facility];  Service: Orthopedics;  Laterality: N/A;   KYPHOPLASTY Right 09/01/2019   Procedure: Right Sacral Radiofrequency Ablation and Cement Augmentation, Right sacrum and iliac crest;  Surgeon: Hessie Knows, MD;  Location: [facility];  Service: Orthopedics;  Laterality:  Right;   PORTA CATH INSERTION N/A 08/28/2019   Procedure: PORTA CATH INSERTION;  Surgeon: Algernon Huxley, MD;  Location: [place] [facility];  Service: Cardiovascular;  Laterality: N/A;    Social History   Socioeconomic History   Marital status: Single    Spouse name: Not on file   Number of children: Not on file   Years of education: Not on file   Highest education level: Not on file  Occupational History   Not on file  Tobacco Use   Smoking status: Never   Smokeless tobacco: Never  Vaping Use   Vaping Use: Never used  Substance and Sexual Activity   Alcohol use: Not Currently   Drug use: Not Currently   Sexual activity: Not Currently    Birth control/protection: None  Other Topics Concern   Not on file  Social History Narrative   Lives at home with children   Social Determinants of Health   Financial Resource Strain: High Risk (11/18/2021)   Overall Financial Resource Strain (CARDIA)    Difficulty of Paying Living Expenses: Very hard  Food Insecurity: Food Insecurity Present (12/26/2021)   Hunger Vital Sign    Worried About Running Out of Food in the Last Year: Often true    Ran Out of Food in the Last  Year: Often true  Transportation Needs: Unmet Transportation Needs (05/10/2022)   PRAPARE - Transportation    Lack of Transportation (Medical): Yes    Lack of Transportation (Non-Medical): Yes  Physical Activity: Inactive (11/18/2021)   Exercise Vital Sign    Days of Exercise per Week: 0 days    Minutes of Exercise per Session: 0 min  Stress: Stress Concern Present (11/18/2021)   [place]    Feeling of Stress : Very much  Social Connections: Socially Isolated (11/18/2021)   Social Connection and Isolation Panel [NHANES]    Frequency of Communication with Friends and Family: Three times a week    Frequency of Social Gatherings with Friends and Family: Three times a week    Attends Religious Services: Never     Active Member of Clubs or Organizations: No    Attends Archivist Meetings: Never    Marital Status: Never married  Intimate Partner Violence: Not At Risk (11/18/2021)   Humiliation, Afraid, Rape, and Kick questionnaire    Fear of Current or Ex-Partner: No    Emotionally Abused: No    Physically Abused: No    Sexually Abused: No    Family History  Problem Relation Age of Onset   Colon cancer Maternal Uncle      Current Outpatient Medications:    albuterol (VENTOLIN HFA) 108 (90 Base) MCG/ACT inhaler, Inhale 2 puffs into the lungs every 6 (six) hours as needed for wheezing or shortness of breath., Disp: 8.5 g, Rfl: 2   benzoyl peroxide-erythromycin (BENZAMYCIN) gel, Apply topically 2 (two) times daily., Disp: 46.6 g, Rfl: 0   butalbital-acetaminophen-caffeine (FIORICET) 50-325-40 MG tablet, Take 1 tablet by mouth every 8 (eight) hours as needed for headache., Disp: 30 tablet, Rfl: 1   Calcium Carb-Cholecalciferol 600-10 MG-MCG TABS, Take 1 tablet by mouth 2 (two) times daily., Disp: 180 tablet, Rfl: 0   capecitabine (XELODA) 500 MG tablet, Take 3 tablets (1,500 mg total) by mouth 2 (two) times daily after a meal. Take for 14 days, then hold for 7 days. Repeat every 21 days., Disp: 84 tablet, Rfl: 2   DULoxetine (CYMBALTA) 60 MG capsule, Take 1 capsule (60 mg total) by mouth daily., Disp: 30 capsule, Rfl: 3   gabapentin (NEURONTIN) 600 MG tablet, Take 1 tablet (600 mg total) by mouth 3 (three) times daily., Disp: 90 tablet, Rfl: 3   OLANZapine (ZYPREXA) 10 MG tablet, Take 1 tablet (10 mg total) by mouth at bedtime as needed (nausea)., Disp: 30 tablet, Rfl: 2   omeprazole (PRILOSEC) 20 MG capsule, Take 1 capsule (20 mg total) by mouth daily., Disp: 30 capsule, Rfl: 3   polyethylene glycol powder (MIRALAX) 17 GM/SCOOP powder, Take 17 g by mouth daily as needed., Disp: 238 g, Rfl: 3   tucatinib (TUKYSA) 150 MG tablet, Take 2 tablets (300 mg total) by mouth 2 (two) times daily., Disp:  120 tablet, Rfl: 3   diphenoxylate-atropine (LOMOTIL) 2.5-0.025 MG tablet, Take 1 tablet by mouth 4 (four) times daily as needed for diarrhea or loose stools. (Patient not taking: Reported on 03/31/2022), Disp: 40 tablet, Rfl: 0   HYDROmorphone (DILAUDID) 2 MG tablet, Take 0.5-1 tablets (1-2 mg total) by mouth every 4 (four) hours as needed for severe pain., Disp: 60 tablet, Rfl: 0   lidocaine-prilocaine (EMLA) cream, Apply 1 Application topically as needed. Apply small amount to port site at least 1 hour prior to it being accessed, cover with plastic wrap (  Patient not taking: Reported on 04/24/2022), Disp: 30 g, Rfl: 3   LORazepam (ATIVAN) 0.5 MG tablet, Take 1 tablet (0.5 mg total) by mouth every 6 (six) hours as needed for anxiety (and nausea). AS NEEDED FOR NAUSEA, Disp: 60 tablet, Rfl: 0   morphine (MS CONTIN) 30 MG 12 hr tablet, Take 1 tablet (30 mg total) by mouth every 12 (twelve) hours., Disp: 60 tablet, Rfl: 0   naloxone (NARCAN) nasal spray 4 mg/0.1 mL, Administer one spray in nostril for opioid overdose. If unresponsive after 1 dose, may repeat a dose every 2-3 minutes in alternating nostrils until emergency services arrive. (Patient not taking: Reported on 03/31/2022), Disp: 2 each, Rfl: 3   nitrofurantoin, macrocrystal-monohydrate, (MACROBID) 100 MG capsule, Take 1 capsule (100 mg total) by mouth 2 (two) times daily., Disp: 10 capsule, Rfl: 0   ondansetron (ZOFRAN) 8 MG tablet, Take 1 tablet (8 mg total) by mouth every 8 (eight) hours as needed for nausea or vomiting., Disp: 45 tablet, Rfl: 2 No current facility-administered medications for this visit.  Facility-Administered Medications Ordered in Other Visits:    0.9 %  sodium chloride infusion, , Intravenous, Continuous, Burns, Wandra Feinstein, NP, Stopped at 06/02/20 1453   heparin lock flush 100 unit/mL, 500 Units, Intravenous, Once, Sindy Guadeloupe, MD   heparin lock flush 100 unit/mL, 500 Units, Intravenous, Once, Sindy Guadeloupe, MD   sodium  chloride flush (NS) 0.9 % injection 10 mL, 10 mL, Intravenous, Once, Sindy Guadeloupe, MD   sodium chloride flush (NS) 0.9 % injection 10 mL, 10 mL, Intravenous, Once, Sindy Guadeloupe, MD  Physical exam:  Vitals:   05/01/22 0944  BP: 110/73  Pulse: 73  Resp: 20  Temp: 98.7 F (37.1 C)  SpO2: 100%  Weight: 132 lb (59.9 kg)   Physical Exam Cardiovascular:     Rate and Rhythm: Normal rate and regular rhythm.     Heart sounds: Normal heart sounds.  Pulmonary:     Effort: Pulmonary effort is normal.     Breath sounds: Normal breath sounds.  Abdominal:     General: Bowel sounds are normal.     Palpations: Abdomen is soft.  Skin:    General: Skin is warm and dry.  Neurological:     Mental Status: She is alert and oriented to person, place, and time.         Latest Ref Rng & Units 04/21/2022   10:40 AM  CMP  Glucose 70 - 99 mg/dL 86   BUN 6 - 20 mg/dL 10   Creatinine 0.44 - 1.00 mg/dL 0.57   Sodium 135 - 145 mmol/L 139   Potassium 3.5 - 5.1 mmol/L 3.0   Chloride 98 - 111 mmol/L 109   CO2 22 - 32 mmol/L 22   Calcium 8.9 - 10.3 mg/dL 8.7   Total Protein 6.5 - 8.1 g/dL 6.9   Total Bilirubin 0.3 - 1.2 mg/dL 0.5   Alkaline Phos 38 - 126 U/L 74   AST 15 - 41 U/L 26   ALT 0 - 44 U/L 16       Latest Ref Rng & Units 04/21/2022   10:40 AM  CBC  WBC 4.0 - 10.5 K/uL 5.4   Hemoglobin 12.0 - 15.0 g/dL 13.0   Hematocrit 36.0 - 46.0 % 36.9   Platelets 150 - 400 K/uL 350     No images are attached to the encounter.  CT CHEST ABDOMEN PELVIS W CONTRAST  Result Date:  05/08/2022 CLINICAL DATA:  42 year old female with history of invasive breast cancer. Follow-up study. * Tracking Code: BO * EXAM: CT CHEST, ABDOMEN, AND PELVIS WITH CONTRAST TECHNIQUE: Multidetector CT imaging of the chest, abdomen and pelvis was performed following the standard protocol during bolus administration of intravenous contrast. RADIATION DOSE REDUCTION: This exam was performed according to the departmental  dose-optimization program which includes automated exposure control, adjustment of the mA and/or kV according to patient size and/or use of iterative reconstruction technique. CONTRAST:  131m OMNIPAQUE IOHEXOL 300 MG/ML  SOLN COMPARISON:  CT of the chest, abdomen and pelvis 01/31/2022. FINDINGS: CT CHEST FINDINGS Cardiovascular: Heart size is normal. There is no significant pericardial fluid, thickening or pericardial calcification. No atherosclerotic calcifications are noted in the thoracic aorta or the coronary arteries. Right internal jugular single-lumen Port-A-Cath with tip terminating in the right atrium. Mediastinum/Nodes: No pathologically enlarged mediastinal, hilar or internal mammary lymph nodes. Esophagus is unremarkable in appearance. No axillary lymphadenopathy. Lungs/Pleura: Tiny 4 mm left lower lobe pulmonary nodule (axial image 100 of series 5), stable compared to the prior examination. No other larger more suspicious appearing pulmonary nodules or masses are noted. There are a few scattered areas of very mild peribronchovascular ground-glass attenuation, most evident in the left lower lobe, likely inflammatory in etiology. No confluent consolidative airspace disease. No pleural effusions. Musculoskeletal: Several well-defined sclerotic lesions are again noted throughout the visualized axial and appendicular skeleton, indicative of widespread metastatic disease to the bones. Overall burden of metastatic disease to the bones appears essentially unchanged compared to the prior study. Chronic compression fracture of T6 with 80% loss of central vertebral body height and post vertebroplasty changes, similar to the prior examination. CT ABDOMEN PELVIS FINDINGS Hepatobiliary: No suspicious cystic or solid hepatic lesions. No intra or extrahepatic biliary ductal dilatation. Gallbladder is normal in appearance. Pancreas: No pancreatic mass. No pancreatic ductal dilatation. No pancreatic or peripancreatic  fluid collections or inflammatory changes. Spleen: Unremarkable. Adrenals/Urinary Tract: Bilateral kidneys and adrenal glands are normal in appearance. No hydroureteronephrosis. Urinary bladder is nearly completely decompressed, but otherwise unremarkable in appearance. Stomach/Bowel: The appearance of the stomach is normal. There is no pathologic dilatation of small bowel or colon. Normal appendix. Vascular/Lymphatic: No significant atherosclerotic disease, aneurysm or dissection noted in the abdominal or pelvic vasculature. No lymphadenopathy noted in the abdomen or pelvis. Reproductive: Uterus and ovaries are unremarkable in appearance. Other: No significant volume of ascites.  No pneumoperitoneum. Musculoskeletal: Multiple well-defined sclerotic lesions are again noted throughout the visualized axial and appendicular skeleton, indicative of metastatic disease to the bones. Postoperative changes of ORIF in the right proximal femur partially imaged. Postprocedural changes of sacroplasty and augmentation of the right ilium are again noted. Chronic compression fractures of L1 and L4 are again noted, with post vertebroplasty changes at both levels. Up to 80% loss of central vertebral body height is again noted at L1 (unchanged). Some extrusion of cement is noted posterior to L4 impinging upon the central spinal canal, similar to the prior study. No definite new osseous lesions are noted. IMPRESSION: 1. Widespread metastatic disease to the bones appears grossly similar to the prior examination. No new extraskeletal metastatic disease noted elsewhere in the chest, abdomen or pelvis. 2. 4 mm left lower lobe pulmonary nodule, stable compared to the prior study, likely benign. 3. Patchy areas of ground-glass attenuation are noted in the left lower lobe, likely of infectious or inflammatory etiology. 4. Additional incidental findings, as above, similar to the prior study. Electronically Signed  By: Vinnie Langton M.D.    On: 05/08/2022 08:34   MR Brain W Wo Contrast  Result Date: 05/06/2022 CLINICAL DATA:  History of breast cancer.  Headache EXAM: MRI HEAD WITHOUT AND WITH CONTRAST TECHNIQUE: Multiplanar, multiecho pulse sequences of the brain and surrounding structures were obtained without and with intravenous contrast. CONTRAST:  21m GADAVIST GADOBUTROL 1 MMOL/ML IV SOLN COMPARISON:  03/01/2022 FINDINGS: BRAIN There are numerous enhancing brain lesions which have all been seen previously: 1.  inferior left cerebellum on 1031:21 2. Left posterior cerebellum on image 31 3. Right paramedian cerebellum on image 35 4. Right occipital white matter on 62 5. Right temporal occipital cortex on image 66 6. Left frontal operculum on image 65 7. Linear cortically based lesion at the right lateral frontal lobe on image 86 8. Anterior left frontal cortex on image 99 9. Left frontal parietal cortex on image 108. 10. Anterior left frontal cortex on image 108 11. High right frontal cortex on image 125 12. High left posterior frontal cortex on image 133. All of these lesions show more fullness not completely explained by differences in technique. The left frontal operculum lesion is the largest and measures up to 14 mm craniocaudal common increased by 2 mm. No new brain lesion is seen. Chronic vasogenic edema in the left frontal lobe adjacent to the largest lesion with mild progression at and gyral thickening. Mild increase in left parietal vasogenic edema on 10:38. Decrease in superior left frontal cortex edema. Other Brain findings: Confluent FLAIR hyperintensity in the deep cerebral white matter. No acute infarct, acute hemorrhage, hydrocephalus, or collection Vascular: Major flow voids and vascular enhancements are preserved Skull and upper cervical spine: No focal marrow lesion. Sinuses/Orbits: Bilateral mastoid opacification which is progressed and may be treatment related. Negative nasopharynx. IMPRESSION: Multiple known enhancing brain  metastases measuring up to 14 mm at the left frontal operculum with 1-2 mm of measurement increase affecting multiple lesions. No new lesion. Moderate tumor associated vasogenic edema in the left frontal lobe with mild progression. Mild and mildly progressed left parietal vasogenic edema. Electronically Signed   By: JJorje GuildM.D.   On: 05/06/2022 11:38   ECHOCARDIOGRAM COMPLETE  Result Date: 05/04/2022    ECHOCARDIOGRAM REPORT   [facility] Medical Rec #:  [id number]            Height:       62.0 in Accession #:    [id number]           Weight:       132.0 lb Date of Birth:  905/10/1980            BSA:          1.602 m Patient Age:    43years              BP:           110/73 mmHg Patient Gender: F                     HR:           62 bpm. Exam Location:  [facility] Procedure: 2D Echo, Cardiac Doppler, Color Doppler and Strain Analysis Indications:     Chemo  History:         Patient has prior history of Echocardiogram examinations, most  recent 02/08/2022. Breast CA with mets.  Sonographer:     Wenda Low Referring Phys:  [id number] Weston Anna Starasia Sinko Diagnosing Phys: Ida Rogue MD  Sonographer Comments: Global longitudinal strain was attempted. IMPRESSIONS  1. Left ventricular ejection fraction, by estimation, is 55 to 60%. The left ventricle has normal function. The left ventricle has no regional wall motion abnormalities. Left ventricular diastolic parameters were normal. The average left ventricular global longitudinal strain is -18.7 %.  2. Right ventricular systolic function is normal. The right ventricular size is normal. There is normal pulmonary artery systolic pressure. The estimated right ventricular systolic pressure is [id number] mmHg.  3. The mitral valve is normal in structure. Mild mitral valve regurgitation. No evidence of mitral stenosis.  4. The aortic valve is tricuspid. Aortic valve regurgitation is not visualized. No aortic  stenosis is present.  5. The inferior vena cava is normal in size with greater than 50% respiratory variability, suggesting right atrial pressure of 3 mmHg. FINDINGS  Left Ventricle: Left ventricular ejection fraction, by estimation, is 55 to 60%. The left ventricle has normal function. The left ventricle has no regional wall motion abnormalities. The average left ventricular global longitudinal strain is -18.7 %. The left ventricular internal cavity size was normal in size. There is no left ventricular hypertrophy. Left ventricular diastolic parameters were normal. Right Ventricle: The right ventricular size is normal. No increase in right ventricular wall thickness. Right ventricular systolic function is normal. There is normal pulmonary artery systolic pressure. The tricuspid regurgitant velocity is 2.37 m/s, and  with an assumed right atrial pressure of 3 mmHg, the estimated right ventricular systolic pressure is [id number] mmHg. Left Atrium: Left atrial size was normal in size. Right Atrium: Right atrial size was normal in size. Pericardium: There is no evidence of pericardial effusion. Mitral Valve: The mitral valve is normal in structure. Mild mitral valve regurgitation. No evidence of mitral valve stenosis. MV peak gradient, 2.2 mmHg. The mean mitral valve gradient is 1.0 mmHg. Tricuspid Valve: The tricuspid valve is normal in structure. Tricuspid valve regurgitation is mild . No evidence of tricuspid stenosis. Aortic Valve: The aortic valve is tricuspid. Aortic valve regurgitation is not visualized. No aortic stenosis is present. Aortic valve mean gradient measures 4.0 mmHg. Aortic valve peak gradient measures 6.8 mmHg. Aortic valve area, by VTI measures 2.35 cm. Pulmonic Valve: The pulmonic valve was normal in structure. Pulmonic valve regurgitation is mild. No evidence of pulmonic stenosis. Aorta: The aortic root is normal in size and structure. Venous: The inferior vena cava is normal in size with greater  than 50% respiratory variability, suggesting right atrial pressure of 3 mmHg. IAS/Shunts: No atrial level shunt detected by color flow Doppler.  LEFT VENTRICLE PLAX 2D LVIDd:         4.00 cm   Diastology LVIDs:         2.50 cm   LV e' medial:    8.81 cm/s LV PW:         1.00 cm   LV E/e' medial:  8.1 LV IVS:        1.00 cm   LV e' lateral:   14.80 cm/s LVOT diam:     1.90 cm   LV E/e' lateral: 4.8 LV SV:         56 LV SV Index:   35        2D Longitudinal Strain LVOT Area:     2.84 cm  2D Strain GLS Avg:     -  18.7 %  RIGHT VENTRICLE RV Basal diam:  2.85 cm RV Mid diam:    2.80 cm RV S prime:     16.30 cm/s TAPSE (M-mode): 2.1 cm LEFT ATRIUM             Index        RIGHT ATRIUM           Index LA diam:        2.90 cm 1.81 cm/m   RA Area:     14.00 cm LA Vol (A2C):   24.1 ml 15.04 ml/m  RA Volume:   34.30 ml  21.41 ml/m LA Vol (A4C):   19.6 ml 12.23 ml/m LA Biplane Vol: 22.9 ml 14.29 ml/m  AORTIC VALVE                    PULMONIC VALVE AV Area (Vmax):    2.31 cm     PV Vmax:       1.00 m/s AV Area (Vmean):   2.19 cm     PV Peak grad:  4.0 mmHg AV Area (VTI):     2.35 cm AV Vmax:           130.00 cm/s AV Vmean:          89.200 cm/s AV VTI:            0.238 m AV Peak Grad:      6.8 mmHg AV Mean Grad:      4.0 mmHg LVOT Vmax:         106.00 cm/s LVOT Vmean:        68.800 cm/s LVOT VTI:          0.197 m LVOT/AV VTI ratio: 0.83  AORTA Ao Root diam: 2.80 cm MITRAL VALVE               TRICUSPID VALVE MV Area (PHT): 4.19 cm    TR Peak grad:   22.5 mmHg MV Area VTI:   2.70 cm    TR Vmax:        237.00 cm/s MV Peak grad:  2.2 mmHg MV Mean grad:  1.0 mmHg    SHUNTS MV Vmax:       0.75 m/s    Systemic VTI:  0.20 m MV Vmean:      48.4 cm/s   Systemic Diam: 1.90 cm MV Decel Time: 181 msec MV E velocity: 71.30 cm/s MV A velocity: 70.20 cm/s MV E/A ratio:  1.02 Ida Rogue MD Electronically signed by Ida Rogue MD Signature Date/Time: 05/04/2022/12:22:09 PM    Final    NM Bone Scan Whole Body  Result Date:  04/28/2022 CLINICAL DATA:  42 year old female with stage IV invasive breast cancer. Restaging. History of skeletal metastases, right femur ORIF, augmented pathologic spinal compression fractures. EXAM: NUCLEAR MEDICINE WHOLE BODY BONE SCAN TECHNIQUE: Whole body anterior and posterior images were obtained approximately 3 hours after intravenous injection of radiopharmaceutical. RADIOPHARMACEUTICALS:  21.1 mCi Technetium-17mMDP IV COMPARISON:  Right femur CT 04/24/2022. Whole-body bone scan 01/31/2022. recent right femur series 04/24/2022. FINDINGS: Expected radiotracer activity in both kidneys and the urinary bladder remains visible. Soft tissue uptake is becoming harder to discern however, raising the possibility of a super scan. New elongated focus of radiotracer activity in the distal left femoral shaft. And increasing since last year number of moderate uptake foci at the distal femoral condyles. Underlying right femur intramedullary rod with proximal interlocking dynamic hip screw and distal interlocking cortical  screw, and continued intense activity in the proximal femur. But there is no knee hardware present on right femur series 04/24/2022. Multifocal uptake in the spine appears primarily related to the augmented compression fractures and not significantly changed. Chronic anterior left rib activity appears stable. Increased activity in the central sternum. Stable increased activity at the right scapular spine. Chronic but increased abnormal activity at the left iliac wing, supra-acetabular pelvis. Stable chronic increased bilateral sacral activity. And also the right sacral ala and medial right iliac bone have been previously augmented as well. A small focus of activity at the left posterior craniocervical junction appears stable from last year and could be degenerative. No definite abnormal skull activity. IMPRESSION: 1. Evidence of progressed osseous metastatic disease since bone scan on 01/31/2022: - new  elongated activity in the distal left femoral shaft, and increasing activity in both femoral condyles which does not seem to be explained by underlying orthopedic hardware. - increased activity in the left hemipelvis around the acetabulum. - increased activity in the sternum. - and questionable developing "super scan" appearance. 2. Progressed although nonspecific intense radiotracer activity in the proximal right femur where ORIF hardware is in place. 3. Stable increased radiotracer activity in the spine and sacrum associated with augmented pathologic fractures. Electronically Signed   By: Genevie Ann M.D.   On: 04/28/2022 12:39   CT FEMUR RIGHT WO CONTRAST  Result Date: 04/24/2022 CLINICAL DATA:  Hip trauma, fracture suspected. EXAM: CT OF THE LOWER RIGHT EXTREMITY WITHOUT CONTRAST TECHNIQUE: Multidetector CT imaging of the right lower extremity was performed according to the standard protocol. RADIATION DOSE REDUCTION: This exam was performed according to the departmental dose-optimization program which includes automated exposure control, adjustment of the mA and/or kV according to patient size and/or use of iterative reconstruction technique. COMPARISON:  Plain film of the pelvis and RIGHT femur dated 04/24/2022. CT of the pelvis dated 01/31/2022. FINDINGS: Alignment of the osseous structures at the RIGHT hip appear stable in alignment compared to the earlier CT of 01/31/2022. Old healed fracture of the proximal RIGHT femur. No evidence of a new fracture line or displaced fracture fragment is seen at the RIGHT hip or within the RIGHT femur. There is a stable lucency within the cortex of the inferior-medial femoral neck suggesting a focal chronic non-fusion, not significantly changed compared to the earlier pelvic CT of 01/31/2022. Overlying fixation hip screw traversing the femoral neck appears intact and stable in alignment. Intramedullary rod within the RIGHT femur appears intact and appropriately positioned.  Visualized intrapelvic soft tissues are unremarkable. Superficial soft tissues about the RIGHT hip and overlying the RIGHT femur are unremarkable. IMPRESSION: No evidence of an acute fracture. Fixation hardware within the RIGHT femur and traversing the RIGHT femoral neck appears intact and appropriately positioned. Electronically Signed   By: Franki Cabot M.D.   On: 04/24/2022 14:40   DG Femur Min 2 Views Right  Result Date: 04/24/2022 CLINICAL DATA:  Fall with injury EXAM: RIGHT FEMUR 2 VIEWS COMPARISON:  March 14, 2021, January 29, 2022 FINDINGS: Status post RIGHT femoral intramedullary rod fixation. Orthopedic hardware is intact and without periprosthetic fracture or lucency. Interval osseous bridging of previously described nondisplaced fracture of the proximal femoral shaft. Similar linear lucency superior to the lesser trochanter consistent with sequela of remote prior fracture. Status post cement augmentation of the RIGHT iliac bone and sacrum. No definitive acute fracture or dislocation is identified. Soft tissue edema of the RIGHT lateral soft tissues. IMPRESSION: 1. Status post RIGHT  femoral intramedullary rod fixation without evidence of hardware complication. 2. No definitive acute fracture or dislocation is identified. Revisualization of sequela of multiple prior fractures. If there is persistent clinical concern, recommend dedicated cross-sectional imaging. 3. No definitive radiographic correlate is identified for increased radiotracer uptake of the distal femoral metadiaphysis. Electronically Signed   By: Valentino Saxon M.D.   On: 04/24/2022 12:57   DG Pelvis 1-2 Views  Result Date: 04/24/2022 CLINICAL DATA:  History of metastatic breast cancer with right hip and right upper leg pain after falling 3 weeks ago. EXAM: PELVIS - 1-2 VIEW COMPARISON:  Nuclear medicine bone scan 01/31/2022; CT scan of the chest, abdomen and pelvis 01/31/2022 FINDINGS: Surgical changes of prior reinforcement  of the right femur with an intramedullary nail and trans femoral neck screw. No definite evidence of periprosthetic fracture. Additional surgical changes of cement augmentation are present at L4, the right sacral ala and right medial iliac bone. IMPRESSION: 1. No clear fracture or malalignment. 2. Evidence of prior reinforcement of right femur with intramedullary nail and trans femoral neck lag screw. 3. Evidence of cement augmentation at L4, the right sacral ala and medial right iliac bone. Electronically Signed   By: Jacqulynn Cadet M.D.   On: 04/24/2022 12:53     Assessment and plan- Patient is a 42 y.o. 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  She is here to discuss further management of breast cancer  Patient was diagnosed with metastatic breast cancer in June 2021 and was on Taxol Herceptin and Perjeta.  She did well on that regimen for a while.  In November 2022 she was found to have brain metastases with no evidence of systemic progression.  She received whole brain radiation and Herceptin and Perjeta was continued.  In June 2023 slight progression of brain mets was noted and patient was switched to Herceptin Tucatinib and Xeloda.  She also received a second round of whole brain radiation and MRI brain in December 2023 showed decrease in the size of brain lesions.  Presently patient complained of increasing right hip pain and had gone to the ER. CT right femur showed no evidence of acute fracture.  Evidence of prior hardware traversing the right femoral neck.  She has received radiation to this area before.  She is not complaining of much pain at all in her left hip.  However bone scan shows new uptake in the distal left femoral shaft.  Increased activity in the left hemipelvis around the acetabulum and increasing activity in the sternum.  Progressed but nonspecific intense activity in the proximal right femur.  This is concerning  for overall disease progression in the bones.  At this time I am awaiting CT chest abdomen and pelvis as well as brain scan to assess the extent of progression.  I would like to switch her to Kadcyla if the present regimen is not working but I would like her to get a second opinion at [facility] to see if there are any other options available.  Neoplasm related pain: Continue as needed diet ordered along with gabapentin Cymbalta and MS Contin   Visit Diagnosis 1. Goals of care, counseling/discussion   2. Primary cancer of left breast with metastasis to other site ([place])   3. Neoplasm related pain      Dr. Randa Evens, MD, MPH [facility] at [facility] [id number] 05/10/2022 5:06 PM

## 2022-05-10 NOTE — Telephone Encounter (Signed)
Patient arrived to clinic today for her u/a. She had a telephone call scheduled at the same time as her lab apt. Pt inquired upon arrival if she needed to see Merrily Pew today for an apt. We changed the apt from telephone to in person visit; however, pt left on cancer center [facility] after lab apt. She was not seen. [name] attempted to reach patient via telephone and pt did not answer. I have personally attempted to reach patient as well.  Note: Per [name], patient appears to have a UTI on her u/a. Urine culture is pending. We still need to speak to patient regarding her results.

## 2022-05-10 NOTE — Progress Notes (Signed)
Virtual Visit via Telephone Note  I connected with Sophia [name] on 05/10/22 at 11:20 AM EST by telephone and verified that I am speaking with the correct person using two identifiers.  Location: Patient: Home Provider: Clinic   I discussed the limitations, risks, security and privacy concerns of performing an evaluation and management service by telephone and the availability of in person appointments. I also discussed with the patient that there may be a patient responsible charge related to this service. The patient expressed understanding and agreed to proceed.   History of Present Illness: Sophia [name] is a 42 y.o. 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 MRI of the brain on 02/08/2021 revealed widespread metastatic disease to the brain with mild associated edema and small area of hemorrhage in the right frontal.  Patient is status post whole brain radiation.  She completed Taxol and was on Herceptin and Perjeta maintenance.  She is now on trastuzumab with Xeloda and tucatinib.    Observations/Objective: Spoke with patient today regarding UTI symptoms.  She describes a couple days of dysuria and urinary frequency/urgency.  She denies fever or chills.  No GI symptoms.  No suprapubic pain or pressure.    Patient reports that her overall pain is improved after dose increase of her MS Contin.  She is using a walker to ambulate but says she has been more active.  Assessment and Plan: Cystitis -start Macrobid 100 mg twice daily x 5 days  Neoplasm related pain -continue MS Contin/hydromorphone  Follow Up Instructions: Follow-up with Dr. Janese Banks on 2/23   I discussed the assessment and treatment plan with the patient. The patient was provided an opportunity to ask questions and all were answered. The patient agreed with the plan and demonstrated an understanding of the instructions.   The  patient was advised to call back or seek an in-person evaluation if the symptoms worsen or if the condition fails to improve as anticipated.  I provided 10 minutes of non-face-to-face time during this encounter.   Irean Hong, NP

## 2022-05-11 ENCOUNTER — Other Ambulatory Visit (HOSPITAL_COMMUNITY): Payer: Self-pay

## 2022-05-11 ENCOUNTER — Encounter: Payer: Self-pay | Admitting: Oncology

## 2022-05-11 LAB — URINE CULTURE

## 2022-05-12 ENCOUNTER — Encounter: Payer: Self-pay | Admitting: Oncology

## 2022-05-12 ENCOUNTER — Inpatient Hospital Stay: Payer: Self-pay

## 2022-05-12 ENCOUNTER — Other Ambulatory Visit: Payer: Self-pay | Admitting: *Deleted

## 2022-05-12 ENCOUNTER — Inpatient Hospital Stay (HOSPITAL_BASED_OUTPATIENT_CLINIC_OR_DEPARTMENT_OTHER): Payer: Self-pay | Admitting: Oncology

## 2022-05-12 ENCOUNTER — Other Ambulatory Visit: Payer: Self-pay

## 2022-05-12 ENCOUNTER — Ambulatory Visit: Payer: Self-pay | Admitting: Oncology

## 2022-05-12 ENCOUNTER — Ambulatory Visit: Payer: Self-pay

## 2022-05-12 VITALS — BP 106/79 | HR 74 | Temp 97.8°F | Resp 16 | Wt 129.0 lb

## 2022-05-12 DIAGNOSIS — M8440XA Pathological fracture, unspecified site, initial encounter for fracture: Secondary | ICD-10-CM

## 2022-05-12 DIAGNOSIS — C50919 Malignant neoplasm of unspecified site of unspecified female breast: Secondary | ICD-10-CM

## 2022-05-12 DIAGNOSIS — Z5112 Encounter for antineoplastic immunotherapy: Secondary | ICD-10-CM

## 2022-05-12 DIAGNOSIS — C7951 Secondary malignant neoplasm of bone: Secondary | ICD-10-CM

## 2022-05-12 DIAGNOSIS — Z79899 Other long term (current) drug therapy: Secondary | ICD-10-CM

## 2022-05-12 DIAGNOSIS — G893 Neoplasm related pain (acute) (chronic): Secondary | ICD-10-CM

## 2022-05-12 DIAGNOSIS — C50912 Malignant neoplasm of unspecified site of left female breast: Secondary | ICD-10-CM

## 2022-05-12 LAB — PREGNANCY, URINE: Preg Test, Ur: NEGATIVE

## 2022-05-12 LAB — CBC WITH DIFFERENTIAL/PLATELET
Abs Immature Granulocytes: 0.02 10*3/uL (ref 0.00–0.07)
Basophils Absolute: 0 10*3/uL (ref 0.0–0.1)
Basophils Relative: 0 %
Eosinophils Absolute: 0.1 10*3/uL (ref 0.0–0.5)
Eosinophils Relative: 2 %
HCT: 35.1 % — ABNORMAL LOW (ref 36.0–46.0)
Hemoglobin: 11.6 g/dL — ABNORMAL LOW (ref 12.0–15.0)
Immature Granulocytes: 0 %
Lymphocytes Relative: 14 %
Lymphs Abs: 0.8 10*3/uL (ref 0.7–4.0)
MCH: 30.2 pg (ref 26.0–34.0)
MCHC: 33 g/dL (ref 30.0–36.0)
MCV: 91.4 fL (ref 80.0–100.0)
Monocytes Absolute: 0.3 10*3/uL (ref 0.1–1.0)
Monocytes Relative: 6 %
Neutro Abs: 4.7 10*3/uL (ref 1.7–7.7)
Neutrophils Relative %: 78 %
Platelets: 521 10*3/uL — ABNORMAL HIGH (ref 150–400)
RBC: 3.84 MIL/uL — ABNORMAL LOW (ref 3.87–5.11)
RDW: 12.5 % (ref 11.5–15.5)
WBC: 6 10*3/uL (ref 4.0–10.5)
nRBC: 0 % (ref 0.0–0.2)

## 2022-05-12 LAB — COMPREHENSIVE METABOLIC PANEL
ALT: 13 U/L (ref 0–44)
AST: 25 U/L (ref 15–41)
Albumin: 3.2 g/dL — ABNORMAL LOW (ref 3.5–5.0)
Alkaline Phosphatase: 112 U/L (ref 38–126)
Anion gap: 9 (ref 5–15)
BUN: 6 mg/dL (ref 6–20)
CO2: 22 mmol/L (ref 22–32)
Calcium: 7.6 mg/dL — ABNORMAL LOW (ref 8.9–10.3)
Chloride: 104 mmol/L (ref 98–111)
Creatinine, Ser: 0.56 mg/dL (ref 0.44–1.00)
GFR, Estimated: >60 mL/min (ref >60)
Glucose, Bld: 106 mg/dL — ABNORMAL HIGH (ref 70–99)
Potassium: 3.5 mmol/L (ref 3.5–5.1)
Sodium: 135 mmol/L (ref 135–145)
Total Bilirubin: 0.4 mg/dL (ref 0.3–1.2)
Total Protein: 7.5 g/dL (ref 6.5–8.1)

## 2022-05-12 MED ORDER — TRASTUZUMAB-ANNS CHEMO 150 MG IV SOLR: 6.0000 mg/kg | Freq: Once | INTRAVENOUS | Status: DC

## 2022-05-12 MED ORDER — SODIUM CHLORIDE 0.9% FLUSH
10.0000 mL | INTRAVENOUS | Status: DC | PRN
  Administered 2022-05-12: 10 mL
  Filled 2022-05-12: qty 10

## 2022-05-12 MED ORDER — ACETAMINOPHEN 325 MG PO TABS
650.0000 mg | ORAL_TABLET | Freq: Once | ORAL | Status: AC
  Administered 2022-05-12: 650 mg via ORAL
  Filled 2022-05-12: qty 2

## 2022-05-12 MED ORDER — DIPHENHYDRAMINE HCL 50 MG/ML IJ SOLN
25.0000 mg | Freq: Once | INTRAMUSCULAR | Status: AC
  Administered 2022-05-12: 25 mg via INTRAVENOUS
  Filled 2022-05-12: qty 1

## 2022-05-12 MED ORDER — HEPARIN SOD (PORK) LOCK FLUSH 100 UNIT/ML IV SOLN
500.0000 [IU] | Freq: Once | INTRAVENOUS | Status: AC | PRN
  Administered 2022-05-12: 500 [IU]
  Filled 2022-05-12: qty 5

## 2022-05-12 MED ORDER — SODIUM CHLORIDE 0.9 % IV SOLN
Freq: Once | INTRAVENOUS | Status: AC
  Filled 2022-05-12: qty 250

## 2022-05-12 MED ORDER — TRASTUZUMAB-ANNS CHEMO 150 MG IV SOLR
378.0000 mg | Freq: Once | INTRAVENOUS | Status: AC
  Administered 2022-05-12: 378 mg via INTRAVENOUS
  Filled 2022-05-12: qty 18

## 2022-05-12 NOTE — Patient Instructions (Signed)
Instrucciones al darle de alta: Discharge Instructions Gracias por elegir al [facility] de Cncer de [place] para brindarle atencin mdica de oncologa y Music therapist.   Si usted tiene una cita de laboratorio con [place], por favor vaya directamente [place] y regstrese en el rea de Control and instrumentation engineer.   Use ropa cmoda y [place] para tener fcil acceso a las vas del Portacath (acceso venoso de Engineer, site duracin) o la lnea PICC (catter central colocado por va perifrica).   Nos esforzamos por ofrecerle tiempo de calidad con su proveedor. Es posible que tenga que volver a programar su cita si llega tarde (15 minutos o ms).  El llegar tarde le afecta a usted y a otros pacientes cuyas citas son posteriores a Merchandiser, retail.  Adems, si usted falta a tres o ms citas sin avisar a la oficina, puede ser retirado(a) de la clnica a discrecin del proveedor.      Para las solicitudes de renovacin de recetas, pida a su farmacia que se ponga en contacto con nuestra oficina y deje que transcurran 82 horas para que se complete el proceso de las renovaciones.    Hoy usted recibi los siguientes agentes de quimioterapia e/o inmunoterapia : trastuzumab    Para ayudar a prevenir las nuseas y los vmitos despus de su tratamiento, le recomendamos que tome su medicamento para las nuseas segn las indicaciones.  LOS SNTOMAS QUE DEBEN COMUNICARSE INMEDIATAMENTE SE INDICAN A CONTINUACIN: *FIEBRE SUPERIOR A 100.4 F (38 C) O MS *ESCALOFROS O SUDORACIN *NUSEAS Y VMITOS QUE NO SE CONTROLAN CON EL MEDICAMENTO PARA LAS NUSEAS *DIFICULTAD INUSUAL PARA RESPIRAR  *MORETONES O HEMORRAGIAS NO HABITUALES *PROBLEMAS URINARIOS (dolor o ardor al Garment/textile technologist o frecuencia para Garment/textile technologist) *PROBLEMAS INTESTINALES (diarrea inusual, estreimiento, dolor cerca del ano) SENSIBILIDAD EN LA BOCA Y EN LA GARGANTA CON O SIN LA PRESENCIA DE LCERAS (dolor de garganta, llagas en la boca o dolor de muelas/dientes) ERUPCIN,  HINCHAZN O DOLORES INUSUALES FLUJO VAGINAL INUSUAL O PICAZN/RASQUIA    Los puntos marcados con un asterisco ( *) indican una posible emergencia y debe hacer un seguimiento tan pronto como le sea posible o vaya al Departamento de Emergencias si se le presenta algn problema.  Por favor, muestre la [place] DE ADVERTENCIA DE Windy Canny DE ADVERTENCIA DE Benay Spice al registrarse en [address] de Emergencias y a la enfermera de triaje.  Si tiene preguntas despus de su visita o necesita cancelar o volver a programar su cita, por favor pngase en contacto con [place]  y [place] instrucciones. Las horas de oficina son de 8:00 a.m. a 4:30 p.m. de lunes a viernes. Por favor, tenga en cuenta que los mensajes de voz que se dejan despus de las 4:00 p.m. posiblemente no se devolvern hasta el siguiente da de [place].  Cerramos los fines de semana y [employer]. En todo momento tiene acceso a una enfermera para preguntas urgentes. Por favor, llame al nmero principal de la clnica  [phone] y [place] instrucciones.   Para cualquier pregunta que no sea de carcter urgente, tambin puede ponerse en contacto con su proveedor [employer] Ahora ofrecemos visitas electrnicas para cualquier persona mayor de 18 aos que solicite atencin mdica en lnea para los sntomas que no sean urgentes. Para ms detalles vaya a [email or web address].   Tambin puede bajar la aplicacin de MyChart! Vaya a la tienda de aplicaciones, busque "MyChart", abra la aplicacin, seleccione  [place], e ingrese con su nombre de usuario y la contrasea de Pharmacist, community.

## 2022-05-12 NOTE — Progress Notes (Signed)
Urination frequency that is burning bad that she has had for several days.

## 2022-05-13 ENCOUNTER — Encounter: Payer: Self-pay | Admitting: Oncology

## 2022-05-13 NOTE — Progress Notes (Signed)
Hematology/Oncology Consult note [facility]  Telephone:([phone] [phone]  Patient Care Team: Sindy Guadeloupe, MD as PCP - General (Oncology) Rico Junker, RN as Registered Nurse Theodore Demark, RN (Inactive) as Registered Nurse Sindy Guadeloupe, MD as Consulting Physician (Hematology and Oncology)   Name of the patient: Sophia [name]  AO:[id number]  29-Apr-1980   Date of visit: 05/13/22  Diagnosis- stage IV metastatic breast cancer ER/PR negative HER-2/neu positive with bone metastases      Chief complaint/ Reason for visit-treatment assessment prior to next cycle of Herceptin and discussed CT scan results and further management  Heme/Onc history: patient is a 42 year old Hispanic female who is here with her friend.  History obtained with the help of an interpreter.Patient self palpated left breast mass which was followed by a diagnostic bilateral mammogram.  Mammogram showed 3.1 x 2.9 x 1.9 cm hypoechoic mass at the 1 o'clock position of the left breast.  For abnormal cortically thickened left axillary lymph nodes measuring up to 5 mm.  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  Patient was also having ongoing back pain and was seen by [facility] orthopedics Dr. Doyle Askew who ordered MRI lumbar spine without contrast which showed possible pathologic fractures of L1 and L4 vertebral bodies with greater than 50% height loss at L1 and abnormal signal involving L2-L3 S1 as well as right iliac bone concerning for metastatic disease.  Patient is a single mother of 3 adult children and is very anxious today.  She reports significant back pain which radiates to her bilateral thighs.  Denies any focal tingling numbness or weakness.  Denies any bowel bladder incontinence.  Pain has been uncontrolled despite taking Tylenol.  No prior history of  abnormal breast biopsies.  No family history of breast cancer   PET and MRI showed 3 areas of pathologic fracture of her spine as well as widespread bony metastatic disease and concern for impending fracture of the right hip.  Given her worsening pain she was asked to come to the ER.  She has been evaluated by Dr. Rudene Christians from orthopedic surgery and underwent kyphoplasty at 3 different levels. T6 L1 and L4 along with radiofrequency ablation.    She also underwent prophylactic fixation of the right hip and not affected the sacral region.     Patient received first dose of Herceptin and Perjeta on 09/04/2019.  Patient initially received Taxol and Herceptin perjeta.  Presently patient is on maintenance Herceptin and Perjeta alone.  Patient found to have brain mets in November 2022 for which she received whole brain radiation treatment but there was no evidence of systemic progression.  She was continued on Herceptin and Perjeta.  Repeat MRI in June 2023 showed slight progression of brain mets.  Patient was switched to Herceptin tucatinib Xeloda    Interval history-right hip pain is currently better controlled with as needed Dilaudid and she is able to ambulate better.  Occasional headaches overall stable  ECOG PS- 2 Pain scale- 3 Opioid associated constipation- no  Review of systems- Review of Systems  Constitutional:  Positive for malaise/fatigue. Negative for chills, fever and weight loss.  HENT:  Negative for congestion, ear discharge and nosebleeds.   Eyes:  Negative for blurred vision.  Respiratory:  Negative for cough, hemoptysis, sputum production, shortness of breath and wheezing.   Cardiovascular:  Negative for chest pain, palpitations, orthopnea and claudication.  Gastrointestinal:  Negative for abdominal pain, blood in stool, constipation, diarrhea, heartburn, melena, nausea and vomiting.  Genitourinary:  Negative for dysuria, flank pain, frequency, hematuria and urgency.  Musculoskeletal:   Negative for back pain, joint pain and myalgias.       Right hip pain  Skin:  Negative for rash.  Neurological:  Negative for dizziness, tingling, focal weakness, seizures, weakness and headaches.  Endo/Heme/Allergies:  Does not bruise/bleed easily.  Psychiatric/Behavioral:  Negative for depression and suicidal ideas. The patient does not have insomnia.       No Known Allergies   Past Medical History:  Diagnosis Date   Anxiety    Breast cancer ([place])    with mets   Cancer ([place])    Colitis    COVID-19 in immunocompromised patient ([place])    Family history of colon cancer    Vertigo      Past Surgical History:  Procedure Laterality Date   BREAST BIOPSY Left 08/14/2019   [place] bx of mass, coil marker, path pending   BREAST BIOPSY Left 08/14/2019   [place] bx of LN, hydromarker, path pending   BREAST BIOPSY Left 08/14/2019   affirm bx of calcs, x marker, path pending   ESOPHAGOGASTRODUODENOSCOPY (EGD) WITH PROPOFOL N/A 10/05/2019   Procedure: ESOPHAGOGASTRODUODENOSCOPY (EGD) WITH PROPOFOL;  Surgeon: Lin Landsman, MD;  Location: [place];  Service: Gastroenterology;  Laterality: N/A;   FLEXIBLE SIGMOIDOSCOPY N/A 10/05/2019   Procedure: FLEXIBLE SIGMOIDOSCOPY;  Surgeon: Lin Landsman, MD;  Location[facility] ENDOSCOPY;  Service: Gastroenterology;  Laterality: N/A;   INTRAMEDULLARY (IM) NAIL INTERTROCHANTERIC Right 09/01/2019   Procedure: INTRAMEDULLARY (IM) NAIL INTERTROCHANTRIC AND RADIOFREQUENCY ABLATION;  Surgeon: Hessie Knows, MD;  Location: [facility] ORS;  Service: Orthopedics;  Laterality: Right;   KYPHOPLASTY N/A 08/29/2019   Procedure: KYPHOPLASTY T6, L1,L4 ,  RADIOFREQUENCY ABLATION;  Surgeon: Hessie Knows, MD;  Location: [facility] ORS;  Service: Orthopedics;  Laterality: N/A;   KYPHOPLASTY Right 09/01/2019   Procedure: Right Sacral Radiofrequency Ablation and Cement Augmentation, Right sacrum and iliac crest;  Surgeon: Hessie Knows, MD;  Location: [facility] ORS;  Service:  Orthopedics;  Laterality: Right;   PORTA CATH INSERTION N/A 08/28/2019   Procedure: PORTA CATH INSERTION;  Surgeon: Algernon Huxley, MD;  Location: [place] [facility];  Service: Cardiovascular;  Laterality: N/A;    Social History   Socioeconomic History   Marital status: Single    Spouse name: Not on file   Number of children: Not on file   Years of education: Not on file   Highest education level: Not on file  Occupational History   Not on file  Tobacco Use   Smoking status: Never   Smokeless tobacco: Never  Vaping Use   Vaping Use: Never used  Substance and Sexual Activity   Alcohol use: Not Currently   Drug use: Not Currently   Sexual activity: Not Currently    Birth control/protection: None  Other Topics Concern   Not on file  Social History Narrative   Lives at home with children   Social Determinants of Health   Financial Resource Strain: High Risk (11/18/2021)   Overall Financial Resource Strain (CARDIA)    Difficulty of Paying Living Expenses: Very hard  Food Insecurity: Food Insecurity Present (12/26/2021)   Hunger Vital Sign    Worried About Running Out of Food in the Last Year: Often true    Ran Out of Food in the Last Year: Often  true  Transportation Needs: Unmet Transportation Needs (05/10/2022)   PRAPARE - Transportation    Lack of Transportation (Medical): Yes    Lack of Transportation (Non-Medical): Yes  Physical Activity: Inactive (11/18/2021)   Exercise Vital Sign    Days of Exercise per Week: 0 days    Minutes of Exercise per Session: 0 min  Stress: Stress Concern Present (11/18/2021)   [place]    Feeling of Stress : Very much  Social Connections: Socially Isolated (11/18/2021)   Social Connection and Isolation Panel [NHANES]    Frequency of Communication with Friends and Family: Three times a week    Frequency of Social Gatherings with Friends and Family: Three times a week    Attends  Religious Services: Never    Active Member of Clubs or Organizations: No    Attends Archivist Meetings: Never    Marital Status: Never married  Intimate Partner Violence: Not At Risk (11/18/2021)   Humiliation, Afraid, Rape, and Kick questionnaire    Fear of Current or Ex-Partner: No    Emotionally Abused: No    Physically Abused: No    Sexually Abused: No    Family History  Problem Relation Age of Onset   Colon cancer Maternal Uncle      Current Outpatient Medications:    albuterol (VENTOLIN HFA) 108 (90 Base) MCG/ACT inhaler, Inhale 2 puffs into the lungs every 6 (six) hours as needed for wheezing or shortness of breath., Disp: 8.5 g, Rfl: 2   benzoyl peroxide-erythromycin (BENZAMYCIN) gel, Apply topically 2 (two) times daily., Disp: 46.6 g, Rfl: 0   butalbital-acetaminophen-caffeine (FIORICET) 50-325-40 MG tablet, Take 1 tablet by mouth every 8 (eight) hours as needed for headache., Disp: 30 tablet, Rfl: 1   Calcium Carb-Cholecalciferol 600-10 MG-MCG TABS, Take 1 tablet by mouth 2 (two) times daily., Disp: 180 tablet, Rfl: 0   capecitabine (XELODA) 500 MG tablet, Take 3 tablets (1,500 mg total) by mouth 2 (two) times daily after a meal. Take for 14 days, then hold for 7 days. Repeat every 21 days., Disp: 84 tablet, Rfl: 2   diphenoxylate-atropine (LOMOTIL) 2.5-0.025 MG tablet, Take 1 tablet by mouth 4 (four) times daily as needed for diarrhea or loose stools., Disp: 40 tablet, Rfl: 0   DULoxetine (CYMBALTA) 60 MG capsule, Take 1 capsule (60 mg total) by mouth daily., Disp: 30 capsule, Rfl: 3   gabapentin (NEURONTIN) 600 MG tablet, Take 1 tablet (600 mg total) by mouth 3 (three) times daily., Disp: 90 tablet, Rfl: 3   HYDROmorphone (DILAUDID) 2 MG tablet, Take 0.5-1 tablets (1-2 mg total) by mouth every 4 (four) hours as needed for severe pain., Disp: 60 tablet, Rfl: 0   lidocaine-prilocaine (EMLA) cream, Apply 1 Application topically as needed. Apply small amount to port site  at least 1 hour prior to it being accessed, cover with plastic wrap, Disp: 30 g, Rfl: 3   LORazepam (ATIVAN) 0.5 MG tablet, Take 1 tablet (0.5 mg total) by mouth every 6 (six) hours as needed for anxiety (and nausea). AS NEEDED FOR NAUSEA, Disp: 60 tablet, Rfl: 0   morphine (MS CONTIN) 30 MG 12 hr tablet, Take 1 tablet (30 mg total) by mouth every 12 (twelve) hours., Disp: 60 tablet, Rfl: 0   naloxone (NARCAN) nasal spray 4 mg/0.1 mL, Administer one spray in nostril for opioid overdose. If unresponsive after 1 dose, may repeat a dose every 2-3 minutes in alternating nostrils until emergency  services arrive., Disp: 2 each, Rfl: 3   nitrofurantoin, macrocrystal-monohydrate, (MACROBID) 100 MG capsule, Take 1 capsule (100 mg total) by mouth 2 (two) times daily., Disp: 10 capsule, Rfl: 0   OLANZapine (ZYPREXA) 10 MG tablet, Take 1 tablet (10 mg total) by mouth at bedtime as needed (nausea)., Disp: 30 tablet, Rfl: 2   omeprazole (PRILOSEC) 20 MG capsule, Take 1 capsule (20 mg total) by mouth daily., Disp: 30 capsule, Rfl: 3   ondansetron (ZOFRAN) 8 MG tablet, Take 1 tablet (8 mg total) by mouth every 8 (eight) hours as needed for nausea or vomiting., Disp: 45 tablet, Rfl: 2   polyethylene glycol powder (MIRALAX) 17 GM/SCOOP powder, Take 17 g by mouth daily as needed., Disp: 238 g, Rfl: 3   tucatinib (TUKYSA) 150 MG tablet, Take 2 tablets (300 mg total) by mouth 2 (two) times daily., Disp: 120 tablet, Rfl: 3 No current facility-administered medications for this visit.  Facility-Administered Medications Ordered in Other Visits:    0.9 %  sodium chloride infusion, , Intravenous, Continuous, Burns, Wandra Feinstein, NP, Stopped at 06/02/20 1453   heparin lock flush 100 unit/mL, 500 Units, Intravenous, Once, Sindy Guadeloupe, MD   heparin lock flush 100 unit/mL, 500 Units, Intravenous, Once, Sindy Guadeloupe, MD   sodium chloride flush (NS) 0.9 % injection 10 mL, 10 mL, Intravenous, Once, Sindy Guadeloupe, MD   sodium  chloride flush (NS) 0.9 % injection 10 mL, 10 mL, Intravenous, Once, Sindy Guadeloupe, MD  Physical exam:  Vitals:   05/12/22 1013  BP: 106/79  Pulse: 74  Resp: 16  Temp: 97.8 F (36.6 C)  TempSrc: Tympanic  SpO2: 100%  Weight: 129 lb (58.5 kg)   Physical Exam Constitutional:      Comments: Walks with a limp  Cardiovascular:     Rate and Rhythm: Normal rate and regular rhythm.     Heart sounds: Normal heart sounds.  Pulmonary:     Effort: Pulmonary effort is normal.     Breath sounds: Normal breath sounds.  Skin:    General: Skin is warm and dry.  Neurological:     Mental Status: She is alert and oriented to person, place, and time.         Latest Ref Rng & Units 05/12/2022    9:28 AM  CMP  Glucose 70 - 99 mg/dL 106   BUN 6 - 20 mg/dL 6   Creatinine 0.44 - 1.00 mg/dL 0.56   Sodium 135 - 145 mmol/L 135   Potassium 3.5 - 5.1 mmol/L 3.5   Chloride 98 - 111 mmol/L 104   CO2 22 - 32 mmol/L 22   Calcium 8.9 - 10.3 mg/dL 7.6   Total Protein 6.5 - 8.1 g/dL 7.5   Total Bilirubin 0.3 - 1.2 mg/dL 0.4   Alkaline Phos 38 - 126 U/L 112   AST 15 - 41 U/L 25   ALT 0 - 44 U/L 13       Latest Ref Rng & Units 05/12/2022    9:28 AM  CBC  WBC 4.0 - 10.5 K/uL 6.0   Hemoglobin 12.0 - 15.0 g/dL 11.6   Hematocrit 36.0 - 46.0 % 35.1   Platelets 150 - 400 K/uL 521     No images are attached to the encounter.  CT CHEST ABDOMEN PELVIS W CONTRAST  Result Date: 05/08/2022 CLINICAL DATA:  42 year old female with history of invasive breast cancer. Follow-up study. * Tracking Code: BO * EXAM: CT CHEST,  ABDOMEN, AND PELVIS WITH CONTRAST TECHNIQUE: Multidetector CT imaging of the chest, abdomen and pelvis was performed following the standard protocol during bolus administration of intravenous contrast. RADIATION DOSE REDUCTION: This exam was performed according to the departmental dose-optimization program which includes automated exposure control, adjustment of the mA and/or kV according to  patient size and/or use of iterative reconstruction technique. CONTRAST:  187m OMNIPAQUE IOHEXOL 300 MG/ML  SOLN COMPARISON:  CT of the chest, abdomen and pelvis 01/31/2022. FINDINGS: CT CHEST FINDINGS Cardiovascular: Heart size is normal. There is no significant pericardial fluid, thickening or pericardial calcification. No atherosclerotic calcifications are noted in the thoracic aorta or the coronary arteries. Right internal jugular single-lumen Port-A-Cath with tip terminating in the right atrium. Mediastinum/Nodes: No pathologically enlarged mediastinal, hilar or internal mammary lymph nodes. Esophagus is unremarkable in appearance. No axillary lymphadenopathy. Lungs/Pleura: Tiny 4 mm left lower lobe pulmonary nodule (axial image 100 of series 5), stable compared to the prior examination. No other larger more suspicious appearing pulmonary nodules or masses are noted. There are a few scattered areas of very mild peribronchovascular ground-glass attenuation, most evident in the left lower lobe, likely inflammatory in etiology. No confluent consolidative airspace disease. No pleural effusions. Musculoskeletal: Several well-defined sclerotic lesions are again noted throughout the visualized axial and appendicular skeleton, indicative of widespread metastatic disease to the bones. Overall burden of metastatic disease to the bones appears essentially unchanged compared to the prior study. Chronic compression fracture of T6 with 80% loss of central vertebral body height and post vertebroplasty changes, similar to the prior examination. CT ABDOMEN PELVIS FINDINGS Hepatobiliary: No suspicious cystic or solid hepatic lesions. No intra or extrahepatic biliary ductal dilatation. Gallbladder is normal in appearance. Pancreas: No pancreatic mass. No pancreatic ductal dilatation. No pancreatic or peripancreatic fluid collections or inflammatory changes. Spleen: Unremarkable. Adrenals/Urinary Tract: Bilateral kidneys and  adrenal glands are normal in appearance. No hydroureteronephrosis. Urinary bladder is nearly completely decompressed, but otherwise unremarkable in appearance. Stomach/Bowel: The appearance of the stomach is normal. There is no pathologic dilatation of small bowel or colon. Normal appendix. Vascular/Lymphatic: No significant atherosclerotic disease, aneurysm or dissection noted in the abdominal or pelvic vasculature. No lymphadenopathy noted in the abdomen or pelvis. Reproductive: Uterus and ovaries are unremarkable in appearance. Other: No significant volume of ascites.  No pneumoperitoneum. Musculoskeletal: Multiple well-defined sclerotic lesions are again noted throughout the visualized axial and appendicular skeleton, indicative of metastatic disease to the bones. Postoperative changes of ORIF in the right proximal femur partially imaged. Postprocedural changes of sacroplasty and augmentation of the right ilium are again noted. Chronic compression fractures of L1 and L4 are again noted, with post vertebroplasty changes at both levels. Up to 80% loss of central vertebral body height is again noted at L1 (unchanged). Some extrusion of cement is noted posterior to L4 impinging upon the central spinal canal, similar to the prior study. No definite new osseous lesions are noted. IMPRESSION: 1. Widespread metastatic disease to the bones appears grossly similar to the prior examination. No new extraskeletal metastatic disease noted elsewhere in the chest, abdomen or pelvis. 2. 4 mm left lower lobe pulmonary nodule, stable compared to the prior study, likely benign. 3. Patchy areas of ground-glass attenuation are noted in the left lower lobe, likely of infectious or inflammatory etiology. 4. Additional incidental findings, as above, similar to the prior study. Electronically Signed   By: DVinnie LangtonM.D.   On: 05/08/2022 08:34   MR Brain W Wo Contrast  Result Date:  05/06/2022 CLINICAL DATA:  History of breast  cancer.  Headache EXAM: MRI HEAD WITHOUT AND WITH CONTRAST TECHNIQUE: Multiplanar, multiecho pulse sequences of the brain and surrounding structures were obtained without and with intravenous contrast. CONTRAST:  67m GADAVIST GADOBUTROL 1 MMOL/ML IV SOLN COMPARISON:  03/01/2022 FINDINGS: BRAIN There are numerous enhancing brain lesions which have all been seen previously: 1.  inferior left cerebellum on 1031:21 2. Left posterior cerebellum on image 31 3. Right paramedian cerebellum on image 35 4. Right occipital white matter on 62 5. Right temporal occipital cortex on image 66 6. Left frontal operculum on image 65 7. Linear cortically based lesion at the right lateral frontal lobe on image 86 8. Anterior left frontal cortex on image 99 9. Left frontal parietal cortex on image 108. 10. Anterior left frontal cortex on image 108 11. High right frontal cortex on image 125 12. High left posterior frontal cortex on image 133. All of these lesions show more fullness not completely explained by differences in technique. The left frontal operculum lesion is the largest and measures up to 14 mm craniocaudal common increased by 2 mm. No new brain lesion is seen. Chronic vasogenic edema in the left frontal lobe adjacent to the largest lesion with mild progression at and gyral thickening. Mild increase in left parietal vasogenic edema on 10:38. Decrease in superior left frontal cortex edema. Other Brain findings: Confluent FLAIR hyperintensity in the deep cerebral white matter. No acute infarct, acute hemorrhage, hydrocephalus, or collection Vascular: Major flow voids and vascular enhancements are preserved Skull and upper cervical spine: No focal marrow lesion. Sinuses/Orbits: Bilateral mastoid opacification which is progressed and may be treatment related. Negative nasopharynx. IMPRESSION: Multiple known enhancing brain metastases measuring up to 14 mm at the left frontal operculum with 1-2 mm of measurement increase affecting  multiple lesions. No new lesion. Moderate tumor associated vasogenic edema in the left frontal lobe with mild progression. Mild and mildly progressed left parietal vasogenic edema. Electronically Signed   By: JJorje GuildM.D.   On: 05/06/2022 11:38   ECHOCARDIOGRAM COMPLETE  Result Date: 05/04/2022    ECHOCARDIOGRAM REPORT   [facility] of [facility]am: 05/04/2022 Medical Rec #:  [id number]            Height:       62.0 in Accession #:    [id number]           Weight:       132.0 lb Date of Birth:  910/21/1982            BSA:          1.602 m Patient Age:    89years              BP:           110/73 mmHg Patient Gender: F                     HR:           62 bpm. Exam Location:  [facility] Procedure: 2D Echo, Cardiac Doppler, Color Doppler and Strain Analysis Indications:     Chemo  History:         Patient has prior history of Echocardiogram examinations, most                  recent 02/08/2022. Breast CA with mets.  Sonographer:     DWenda LowReferring Phys:  [id number] [place] C Marleen Moret Diagnosing Phys: Ida Rogue MD  Sonographer Comments: Global longitudinal strain was attempted. IMPRESSIONS  1. Left ventricular ejection fraction, by estimation, is 55 to 60%. The left ventricle has normal function. The left ventricle has no regional wall motion abnormalities. Left ventricular diastolic parameters were normal. The average left ventricular global longitudinal strain is -18.7 %.  2. Right ventricular systolic function is normal. The right ventricular size is normal. There is normal pulmonary artery systolic pressure. The estimated right ventricular systolic pressure is [id number] mmHg.  3. The mitral valve is normal in structure. Mild mitral valve regurgitation. No evidence of mitral stenosis.  4. The aortic valve is tricuspid. Aortic valve regurgitation is not visualized. No aortic stenosis is present.  5. The inferior vena cava is normal in size with greater than 50% respiratory  variability, suggesting right atrial pressure of 3 mmHg. FINDINGS  Left Ventricle: Left ventricular ejection fraction, by estimation, is 55 to 60%. The left ventricle has normal function. The left ventricle has no regional wall motion abnormalities. The average left ventricular global longitudinal strain is -18.7 %. The left ventricular internal cavity size was normal in size. There is no left ventricular hypertrophy. Left ventricular diastolic parameters were normal. Right Ventricle: The right ventricular size is normal. No increase in right ventricular wall thickness. Right ventricular systolic function is normal. There is normal pulmonary artery systolic pressure. The tricuspid regurgitant velocity is 2.37 m/s, and  with an assumed right atrial pressure of 3 mmHg, the estimated right ventricular systolic pressure is [id number] mmHg. Left Atrium: Left atrial size was normal in size. Right Atrium: Right atrial size was normal in size. Pericardium: There is no evidence of pericardial effusion. Mitral Valve: The mitral valve is normal in structure. Mild mitral valve regurgitation. No evidence of mitral valve stenosis. MV peak gradient, 2.2 mmHg. The mean mitral valve gradient is 1.0 mmHg. Tricuspid Valve: The tricuspid valve is normal in structure. Tricuspid valve regurgitation is mild . No evidence of tricuspid stenosis. Aortic Valve: The aortic valve is tricuspid. Aortic valve regurgitation is not visualized. No aortic stenosis is present. Aortic valve mean gradient measures 4.0 mmHg. Aortic valve peak gradient measures 6.8 mmHg. Aortic valve area, by VTI measures 2.35 cm. Pulmonic Valve: The pulmonic valve was normal in structure. Pulmonic valve regurgitation is mild. No evidence of pulmonic stenosis. Aorta: The aortic root is normal in size and structure. Venous: The inferior vena cava is normal in size with greater than 50% respiratory variability, suggesting right atrial pressure of 3 mmHg. IAS/Shunts: No atrial  level shunt detected by color flow Doppler.  LEFT VENTRICLE PLAX 2D LVIDd:         4.00 cm   Diastology LVIDs:         2.50 cm   LV e' medial:    8.81 cm/s LV PW:         1.00 cm   LV E/e' medial:  8.1 LV IVS:        1.00 cm   LV e' lateral:   14.80 cm/s LVOT diam:     1.90 cm   LV E/e' lateral: 4.8 LV SV:         56 LV SV Index:   35        2D Longitudinal Strain LVOT Area:     2.84 cm  2D Strain GLS Avg:     -18.7 %  RIGHT VENTRICLE RV Basal diam:  2.85 cm RV Mid diam:  2.80 cm RV S prime:     16.30 cm/s TAPSE (M-mode): 2.1 cm LEFT ATRIUM             Index        RIGHT ATRIUM           Index LA diam:        2.90 cm 1.81 cm/m   RA Area:     14.00 cm LA Vol (A2C):   24.1 ml 15.04 ml/m  RA Volume:   34.30 ml  21.41 ml/m LA Vol (A4C):   19.6 ml 12.23 ml/m LA Biplane Vol: 22.9 ml 14.29 ml/m  AORTIC VALVE                    PULMONIC VALVE AV Area (Vmax):    2.31 cm     PV Vmax:       1.00 m/s AV Area (Vmean):   2.19 cm     PV Peak grad:  4.0 mmHg AV Area (VTI):     2.35 cm AV Vmax:           130.00 cm/s AV Vmean:          89.200 cm/s AV VTI:            0.238 m AV Peak Grad:      6.8 mmHg AV Mean Grad:      4.0 mmHg LVOT Vmax:         106.00 cm/s LVOT Vmean:        68.800 cm/s LVOT VTI:          0.197 m LVOT/AV VTI ratio: 0.83  AORTA Ao Root diam: 2.80 cm MITRAL VALVE               TRICUSPID VALVE MV Area (PHT): 4.19 cm    TR Peak grad:   22.5 mmHg MV Area VTI:   2.70 cm    TR Vmax:        237.00 cm/s MV Peak grad:  2.2 mmHg MV Mean grad:  1.0 mmHg    SHUNTS MV Vmax:       0.75 m/s    Systemic VTI:  0.20 m MV Vmean:      48.4 cm/s   Systemic Diam: 1.90 cm MV Decel Time: 181 msec MV E velocity: 71.30 cm/s MV A velocity: 70.20 cm/s MV E/A ratio:  1.02 Ida Rogue MD Electronically signed by Ida Rogue MD Signature Date/Time: 05/04/2022/12:22:09 PM    Final    NM Bone Scan Whole Body  Result Date: 04/28/2022 CLINICAL DATA:  42 year old female with stage IV invasive breast cancer. Restaging. History of  skeletal metastases, right femur ORIF, augmented pathologic spinal compression fractures. EXAM: NUCLEAR MEDICINE WHOLE BODY BONE SCAN TECHNIQUE: Whole body anterior and posterior images were obtained approximately 3 hours after intravenous injection of radiopharmaceutical. RADIOPHARMACEUTICALS:  21.1 mCi Technetium-12mMDP IV COMPARISON:  Right femur CT 04/24/2022. Whole-body bone scan 01/31/2022. recent right femur series 04/24/2022. FINDINGS: Expected radiotracer activity in both kidneys and the urinary bladder remains visible. Soft tissue uptake is becoming harder to discern however, raising the possibility of a super scan. New elongated focus of radiotracer activity in the distal left femoral shaft. And increasing since last year number of moderate uptake foci at the distal femoral condyles. Underlying right femur intramedullary rod with proximal interlocking dynamic hip screw and distal interlocking cortical screw, and continued intense activity in the proximal femur. But there is no knee hardware present on  right femur series 04/24/2022. Multifocal uptake in the spine appears primarily related to the augmented compression fractures and not significantly changed. Chronic anterior left rib activity appears stable. Increased activity in the central sternum. Stable increased activity at the right scapular spine. Chronic but increased abnormal activity at the left iliac wing, supra-acetabular pelvis. Stable chronic increased bilateral sacral activity. And also the right sacral ala and medial right iliac bone have been previously augmented as well. A small focus of activity at the left posterior craniocervical junction appears stable from last year and could be degenerative. No definite abnormal skull activity. IMPRESSION: 1. Evidence of progressed osseous metastatic disease since bone scan on 01/31/2022: - new elongated activity in the distal left femoral shaft, and increasing activity in both femoral condyles  which does not seem to be explained by underlying orthopedic hardware. - increased activity in the left hemipelvis around the acetabulum. - increased activity in the sternum. - and questionable developing "super scan" appearance. 2. Progressed although nonspecific intense radiotracer activity in the proximal right femur where ORIF hardware is in place. 3. Stable increased radiotracer activity in the spine and sacrum associated with augmented pathologic fractures. Electronically Signed   By: Genevie Ann M.D.   On: 04/28/2022 12:39   CT FEMUR RIGHT WO CONTRAST  Result Date: 04/24/2022 CLINICAL DATA:  Hip trauma, fracture suspected. EXAM: CT OF THE LOWER RIGHT EXTREMITY WITHOUT CONTRAST TECHNIQUE: Multidetector CT imaging of the right lower extremity was performed according to the standard protocol. RADIATION DOSE REDUCTION: This exam was performed according to the departmental dose-optimization program which includes automated exposure control, adjustment of the mA and/or kV according to patient size and/or use of iterative reconstruction technique. COMPARISON:  Plain film of the pelvis and RIGHT femur dated 04/24/2022. CT of the pelvis dated 01/31/2022. FINDINGS: Alignment of the osseous structures at the RIGHT hip appear stable in alignment compared to the earlier CT of 01/31/2022. Old healed fracture of the proximal RIGHT femur. No evidence of a new fracture line or displaced fracture fragment is seen at the RIGHT hip or within the RIGHT femur. There is a stable lucency within the cortex of the inferior-medial femoral neck suggesting a focal chronic non-fusion, not significantly changed compared to the earlier pelvic CT of 01/31/2022. Overlying fixation hip screw traversing the femoral neck appears intact and stable in alignment. Intramedullary rod within the RIGHT femur appears intact and appropriately positioned. Visualized intrapelvic soft tissues are unremarkable. Superficial soft tissues about the RIGHT hip and  overlying the RIGHT femur are unremarkable. IMPRESSION: No evidence of an acute fracture. Fixation hardware within the RIGHT femur and traversing the RIGHT femoral neck appears intact and appropriately positioned. Electronically Signed   By: Franki Cabot M.D.   On: 04/24/2022 14:40   DG Femur Min 2 Views Right  Result Date: 04/24/2022 CLINICAL DATA:  Fall with injury EXAM: RIGHT FEMUR 2 VIEWS COMPARISON:  March 14, 2021, January 29, 2022 FINDINGS: Status post RIGHT femoral intramedullary rod fixation. Orthopedic hardware is intact and without periprosthetic fracture or lucency. Interval osseous bridging of previously described nondisplaced fracture of the proximal femoral shaft. Similar linear lucency superior to the lesser trochanter consistent with sequela of remote prior fracture. Status post cement augmentation of the RIGHT iliac bone and sacrum. No definitive acute fracture or dislocation is identified. Soft tissue edema of the RIGHT lateral soft tissues. IMPRESSION: 1. Status post RIGHT femoral intramedullary rod fixation without evidence of hardware complication. 2. No definitive acute fracture or dislocation is  identified. Revisualization of sequela of multiple prior fractures. If there is persistent clinical concern, recommend dedicated cross-sectional imaging. 3. No definitive radiographic correlate is identified for increased radiotracer uptake of the distal femoral metadiaphysis. Electronically Signed   By: Valentino Saxon M.D.   On: 04/24/2022 12:57   DG Pelvis 1-2 Views  Result Date: 04/24/2022 CLINICAL DATA:  History of metastatic breast cancer with right hip and right upper leg pain after falling 3 weeks ago. EXAM: PELVIS - 1-2 VIEW COMPARISON:  Nuclear medicine bone scan 01/31/2022; CT scan of the chest, abdomen and pelvis 01/31/2022 FINDINGS: Surgical changes of prior reinforcement of the right femur with an intramedullary nail and trans femoral neck screw. No definite evidence of  periprosthetic fracture. Additional surgical changes of cement augmentation are present at L4, the right sacral ala and right medial iliac bone. IMPRESSION: 1. No clear fracture or malalignment. 2. Evidence of prior reinforcement of right femur with intramedullary nail and trans femoral neck lag screw. 3. Evidence of cement augmentation at L4, the right sacral ala and medial right iliac bone. Electronically Signed   By: Jacqulynn Cadet M.D.   On: 04/24/2022 12:53     Assessment and plan- Patient is a 42 y.o. 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  She is here to discuss MRI brain and CT scan results and further management  I have reviewed CT chest abdomen pelvis images as well as MRI brain images independently.  MRI brain shows overall stable areas of known 12 for brain metastases.  One of the lesions appear slightly larger.  Systemically she does not have any evidence of visceral metastatic disease.  She has known bone metastases however on her recent bone scan that was increasing uptake in bilateral femoral condyles as well as distal aspect of the left femur and left pelvis concerning for disease progression.    I would highly like to switch her to Kadcyla at this time.  However she has an upcoming second opinion appointment at [facility] and I would like to see if she qualifies for any clinical trial there.  If there is no ongoing clinical trial and depending on [facility] opinion I will switch her to Kadcyla in 3 weeks.  Today she will continue with Herceptin Xeloda and Tucatinib for this cycle.  Right hip pain: She has received palliative radiation to that area as well as undergone surgery.  She is currently on as needed Dilaudid as well as long-acting pain medications and gabapentin which she will continue   Visit Diagnosis 1. Primary malignant neoplasm of breast with metastasis ([place])   2. Multiple pathological fractures, initial encounter    3. 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 Encounter for monoclonal antibody treatment for malignancy   5. High risk medication use   6. Neoplasm related pain      Dr. Randa Evens, MD, MPH [facility] [id number] 05/13/2022 12:02 PM

## 2022-05-16 ENCOUNTER — Other Ambulatory Visit (HOSPITAL_COMMUNITY): Payer: Self-pay

## 2022-05-16 ENCOUNTER — Encounter: Payer: Self-pay | Admitting: Oncology

## 2022-05-16 ENCOUNTER — Other Ambulatory Visit: Payer: Self-pay

## 2022-05-17 ENCOUNTER — Other Ambulatory Visit: Payer: Self-pay | Admitting: *Deleted

## 2022-05-17 NOTE — Progress Notes (Signed)
Letter to help get family membrs to come and help pt.

## 2022-05-18 ENCOUNTER — Other Ambulatory Visit: Payer: Self-pay | Admitting: *Deleted

## 2022-05-18 ENCOUNTER — Other Ambulatory Visit (HOSPITAL_COMMUNITY): Payer: Self-pay

## 2022-05-18 ENCOUNTER — Other Ambulatory Visit: Payer: Self-pay

## 2022-05-18 DIAGNOSIS — C7951 Secondary malignant neoplasm of bone: Secondary | ICD-10-CM

## 2022-05-18 DIAGNOSIS — G893 Neoplasm related pain (acute) (chronic): Secondary | ICD-10-CM

## 2022-05-18 MED ORDER — OMEPRAZOLE 20 MG PO CPDR
20.0000 mg | DELAYED_RELEASE_CAPSULE | Freq: Every day | ORAL | 3 refills | Status: DC
  Filled 2022-05-18: qty 30, 30d supply, fill #0

## 2022-05-19 ENCOUNTER — Encounter: Payer: Self-pay | Admitting: Oncology

## 2022-05-19 ENCOUNTER — Other Ambulatory Visit: Payer: Self-pay

## 2022-05-23 ENCOUNTER — Encounter: Payer: Self-pay | Admitting: Oncology

## 2022-05-23 ENCOUNTER — Other Ambulatory Visit (HOSPITAL_COMMUNITY): Payer: Self-pay

## 2022-05-23 ENCOUNTER — Telehealth: Payer: Self-pay | Admitting: Pharmacist

## 2022-05-23 ENCOUNTER — Other Ambulatory Visit: Payer: Self-pay | Admitting: Oncology

## 2022-05-23 NOTE — Telephone Encounter (Signed)
[place] to set-up delivery of patient tucatinib. Expected delivery date on 05/26/22. Medication will be delivered to the cancer center to be given to patient due to housing issues.

## 2022-05-24 ENCOUNTER — Other Ambulatory Visit (HOSPITAL_COMMUNITY): Payer: Self-pay

## 2022-05-26 ENCOUNTER — Other Ambulatory Visit: Payer: Self-pay

## 2022-05-31 ENCOUNTER — Other Ambulatory Visit: Payer: Self-pay

## 2022-05-31 ENCOUNTER — Encounter: Payer: Self-pay | Admitting: Oncology

## 2022-05-31 ENCOUNTER — Other Ambulatory Visit: Payer: Self-pay | Admitting: *Deleted

## 2022-05-31 MED ORDER — HYDROMORPHONE HCL 2 MG PO TABS
1.0000 mg | ORAL_TABLET | ORAL | 0 refills | Status: DC | PRN
  Filled 2022-05-31: qty 60, 10d supply, fill #0

## 2022-05-31 MED ORDER — MORPHINE SULFATE ER 30 MG PO TBCR
30.0000 mg | EXTENDED_RELEASE_TABLET | Freq: Two times a day (BID) | ORAL | 0 refills | Status: DC
  Filled 2022-05-31: qty 60, 30d supply, fill #0

## 2022-05-31 NOTE — Telephone Encounter (Signed)
Patient came to the cancer center and requested RF on dilaudid and morphine and macrobid.  RF on macrobid not appropriate. We can RF dilaudid and morphine per josh.- scripts pended for provider.

## 2022-06-02 ENCOUNTER — Inpatient Hospital Stay: Payer: Self-pay

## 2022-06-02 ENCOUNTER — Encounter: Payer: Self-pay | Admitting: Nurse Practitioner

## 2022-06-02 ENCOUNTER — Inpatient Hospital Stay (HOSPITAL_BASED_OUTPATIENT_CLINIC_OR_DEPARTMENT_OTHER): Payer: Self-pay | Admitting: Nurse Practitioner

## 2022-06-02 ENCOUNTER — Inpatient Hospital Stay (HOSPITAL_BASED_OUTPATIENT_CLINIC_OR_DEPARTMENT_OTHER): Payer: Self-pay | Admitting: Hospice and Palliative Medicine

## 2022-06-02 ENCOUNTER — Inpatient Hospital Stay: Payer: Self-pay | Attending: Oncology

## 2022-06-02 VITALS — BP 108/67 | HR 67 | Temp 98.5°F | Resp 18 | Wt 127.6 lb

## 2022-06-02 DIAGNOSIS — C50919 Malignant neoplasm of unspecified site of unspecified female breast: Secondary | ICD-10-CM

## 2022-06-02 DIAGNOSIS — G893 Neoplasm related pain (acute) (chronic): Secondary | ICD-10-CM

## 2022-06-02 DIAGNOSIS — M8440XA Pathological fracture, unspecified site, initial encounter for fracture: Secondary | ICD-10-CM

## 2022-06-02 DIAGNOSIS — R350 Frequency of micturition: Secondary | ICD-10-CM | POA: Diagnosis not present

## 2022-06-02 DIAGNOSIS — Z5112 Encounter for antineoplastic immunotherapy: Secondary | ICD-10-CM

## 2022-06-02 DIAGNOSIS — C50912 Malignant neoplasm of unspecified site of left female breast: Secondary | ICD-10-CM | POA: Diagnosis not present

## 2022-06-02 DIAGNOSIS — R3915 Urgency of urination: Secondary | ICD-10-CM | POA: Insufficient documentation

## 2022-06-02 DIAGNOSIS — Z171 Estrogen receptor negative status [ER-]: Secondary | ICD-10-CM | POA: Diagnosis not present

## 2022-06-02 DIAGNOSIS — R319 Hematuria, unspecified: Secondary | ICD-10-CM | POA: Diagnosis not present

## 2022-06-02 DIAGNOSIS — C7951 Secondary malignant neoplasm of bone: Secondary | ICD-10-CM

## 2022-06-02 DIAGNOSIS — R300 Dysuria: Secondary | ICD-10-CM | POA: Diagnosis not present

## 2022-06-02 DIAGNOSIS — R112 Nausea with vomiting, unspecified: Secondary | ICD-10-CM | POA: Insufficient documentation

## 2022-06-02 DIAGNOSIS — C7931 Secondary malignant neoplasm of brain: Secondary | ICD-10-CM | POA: Insufficient documentation

## 2022-06-02 DIAGNOSIS — Z79899 Other long term (current) drug therapy: Secondary | ICD-10-CM

## 2022-06-02 LAB — CBC WITH DIFFERENTIAL/PLATELET
Abs Immature Granulocytes: 0.02 10*3/uL (ref 0.00–0.07)
Basophils Absolute: 0 10*3/uL (ref 0.0–0.1)
Basophils Relative: 1 %
Eosinophils Absolute: 0.1 10*3/uL (ref 0.0–0.5)
Eosinophils Relative: 3 %
HCT: 34.8 % — ABNORMAL LOW (ref 36.0–46.0)
Hemoglobin: 11.7 g/dL — ABNORMAL LOW (ref 12.0–15.0)
Immature Granulocytes: 1 %
Lymphocytes Relative: 29 %
Lymphs Abs: 1.1 10*3/uL (ref 0.7–4.0)
MCH: 29.6 pg (ref 26.0–34.0)
MCHC: 33.6 g/dL (ref 30.0–36.0)
MCV: 88.1 fL (ref 80.0–100.0)
Monocytes Absolute: 0.3 10*3/uL (ref 0.1–1.0)
Monocytes Relative: 9 %
Neutro Abs: 2.2 10*3/uL (ref 1.7–7.7)
Neutrophils Relative %: 57 %
Platelets: 416 10*3/uL — ABNORMAL HIGH (ref 150–400)
RBC: 3.95 MIL/uL (ref 3.87–5.11)
RDW: 12.4 % (ref 11.5–15.5)
WBC: 3.7 10*3/uL — ABNORMAL LOW (ref 4.0–10.5)
nRBC: 0 % (ref 0.0–0.2)

## 2022-06-02 LAB — COMPREHENSIVE METABOLIC PANEL
ALT: 11 U/L (ref 0–44)
AST: 22 U/L (ref 15–41)
Albumin: 3.3 g/dL — ABNORMAL LOW (ref 3.5–5.0)
Alkaline Phosphatase: 89 U/L (ref 38–126)
Anion gap: 7 (ref 5–15)
BUN: <5 mg/dL — ABNORMAL LOW (ref 6–20)
CO2: 24 mmol/L (ref 22–32)
Calcium: 8.2 mg/dL — ABNORMAL LOW (ref 8.9–10.3)
Chloride: 106 mmol/L (ref 98–111)
Creatinine, Ser: 0.51 mg/dL (ref 0.44–1.00)
GFR, Estimated: >60 mL/min (ref >60)
Glucose, Bld: 90 mg/dL (ref 70–99)
Potassium: 3.6 mmol/L (ref 3.5–5.1)
Sodium: 137 mmol/L (ref 135–145)
Total Bilirubin: 0.5 mg/dL (ref 0.3–1.2)
Total Protein: 7.8 g/dL (ref 6.5–8.1)

## 2022-06-02 LAB — PREGNANCY, URINE: Preg Test, Ur: NEGATIVE

## 2022-06-02 MED ORDER — HEPARIN SOD (PORK) LOCK FLUSH 100 UNIT/ML IV SOLN
500.0000 [IU] | Freq: Once | INTRAVENOUS | Status: DC | PRN
  Filled 2022-06-02: qty 5

## 2022-06-02 MED ORDER — SODIUM CHLORIDE 0.9 % IV SOLN
Freq: Once | INTRAVENOUS | Status: AC
  Filled 2022-06-02: qty 250

## 2022-06-02 MED ORDER — ACETAMINOPHEN 325 MG PO TABS
650.0000 mg | ORAL_TABLET | Freq: Once | ORAL | Status: AC
  Administered 2022-06-02: 650 mg via ORAL
  Filled 2022-06-02: qty 2

## 2022-06-02 MED ORDER — TRASTUZUMAB-ANNS CHEMO 150 MG IV SOLR
378.0000 mg | Freq: Once | INTRAVENOUS | Status: AC
  Administered 2022-06-02: 378 mg via INTRAVENOUS
  Filled 2022-06-02: qty 18

## 2022-06-02 MED ORDER — DIPHENHYDRAMINE HCL 50 MG/ML IJ SOLN
25.0000 mg | Freq: Once | INTRAMUSCULAR | Status: AC
  Administered 2022-06-02: 25 mg via INTRAVENOUS
  Filled 2022-06-02: qty 1

## 2022-06-02 NOTE — Progress Notes (Signed)
Sophia [name] at [facility] [phone] [phone]   Name: Sophia [name] Date: 06/02/2022 MRN: [id number]  DOB: Oct 26, 1980  Patient Care Team: Sindy Guadeloupe, MD as PCP - General (Oncology) Rico Junker, RN as Registered Nurse Theodore Demark, RN (Inactive) as Registered Nurse Sindy Guadeloupe, MD as Consulting Physician (Hematology and Oncology)    REASON FOR CONSULTATION: Sophia [name] is a 42 y.o. 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 MRI of the brain on 02/08/2021 revealed widespread metastatic disease to the brain with mild associated edema and small area of hemorrhage in the right frontal.  Patient is status post whole brain radiation.  She completed Taxol and was on Herceptin and Perjeta maintenance.  She is now on trastuzumab with Xeloda and tucatinib. She was referred to palliative care to help address goals and manage ongoing symptoms  SOCIAL HISTORY:     reports that she has never smoked. She has never used smokeless tobacco. She reports that she does not currently use alcohol. She reports that she does not currently use drugs.  Patient is from [place].  She speaks [place].  She lives here with her 3 children ages 60-16.  She has no family or friend/social support locally.  She is unable to work.  ADVANCE DIRECTIVES:  Does not have  CODE STATUS:   PAST MEDICAL HISTORY: Past Medical History:  Diagnosis Date   Anxiety    Breast cancer ([place])    with mets   Cancer ([place])    Colitis    COVID-19 in immunocompromised patient ([facility])    Family history of colon cancer    Vertigo     PAST SURGICAL HISTORY:  Past Surgical History:  Procedure Laterality Date   BREAST BIOPSY Left 08/14/2019   [place] bx of mass, coil marker, path pending   BREAST BIOPSY Left 08/14/2019   [place] bx of LN, hydromarker, path pending    BREAST BIOPSY Left 08/14/2019   affirm bx of calcs, x marker, path pending   ESOPHAGOGASTRODUODENOSCOPY (EGD) WITH PROPOFOL N/A 10/05/2019   Procedure: ESOPHAGOGASTRODUODENOSCOPY (EGD) WITH PROPOFOL;  Surgeon: Lin Landsman, MD;  Location: [facility];  Service: Gastroenterology;  Laterality: N/A;   FLEXIBLE SIGMOIDOSCOPY N/A 10/05/2019   Procedure: FLEXIBLE SIGMOIDOSCOPY;  Surgeon: Lin Landsman, MD;  Location: [facility] ENDOSCOPY;  Service: Gastroenterology;  Laterality: N/A;   INTRAMEDULLARY (IM) NAIL INTERTROCHANTERIC Right 09/01/2019   Procedure: INTRAMEDULLARY (IM) NAIL INTERTROCHANTRIC AND RADIOFREQUENCY ABLATION;  Surgeon: Hessie Knows, MD;  Location: [facility] ORS;  Service: Orthopedics;  Laterality: Right;   KYPHOPLASTY N/A 08/29/2019   Procedure: KYPHOPLASTY T6, L1,L4 ,  RADIOFREQUENCY ABLATION;  Surgeon: Hessie Knows, MD;  Location: [facility] ORS;  Service: Orthopedics;  Laterality: N/A;   KYPHOPLASTY Right 09/01/2019   Procedure: Right Sacral Radiofrequency Ablation and Cement Augmentation, Right sacrum and iliac crest;  Surgeon: Hessie Knows, MD;  Location: [facility] ORS;  Service: Orthopedics;  Laterality: Right;   PORTA CATH INSERTION N/A 08/28/2019   Procedure: PORTA CATH INSERTION;  Surgeon: Algernon Huxley, MD;  Location: [place] CV LAB;  Service: Cardiovascular;  Laterality: N/A;    HEMATOLOGY/ONCOLOGY HISTORY:  Oncology History  Primary cancer of left breast with metastasis to other site [facility])  08/28/2019 Initial Diagnosis   Primary cancer of left breast with metastasis to other site [facility])   01/21/2021 Cancer Staging   Staging form: Breast, AJCC 8th Edition -  Clinical stage from 01/21/2021: Stage IV (cT2, cN1, cM1, G2, ER-, PR-, HER2+) - Signed by Sindy Guadeloupe, MD on 01/21/2021 Histologic grading system: 3 grade system   Primary malignant neoplasm of breast with metastasis ([place])  08/29/2019 Initial Diagnosis   Metastatic breast cancer ([place])   09/04/2019 - 10/31/2021  Chemotherapy   Patient is on Treatment Plan : BREAST Weekly Paclitaxel + Trastuzumab + Pertuzumab q21d      Genetic Testing   Negative genetic testing. No pathogenic variants identified on the [facility] CancerNext-Expanded+RNA panel. The report date is 04/13/2020.   The CancerNext-Expanded + RNAinsight gene panel offered by [employer] and includes sequencing and rearrangement analysis for the following 77 genes: IP, ALK, APC*, ATM*, AXIN2, BAP1, BARD1, BLM, BMPR1A, BRCA1*, BRCA2*, BRIP1*, CDC73, CDH1*,CDK4, CDKN1B, CDKN2A, CHEK2*, CTNNA1, DICER1, FANCC, FH, FLCN, GALNT12, KIF1B, LZTR1, MAX, MEN1, MET, MLH1*, MSH2*, MSH3, MSH6*, MUTYH*, NBN, NF1*, NF2, NTHL1, PALB2*, PHOX2B, PMS2*, POT1, PRKAR1A, PTCH1, PTEN*, RAD51C*, RAD51D*,RB1, RECQL, RET, SDHA, SDHAF2, SDHB, SDHC, SDHD, SMAD4, SMARCA4, SMARCB1, SMARCE1, STK11, SUFU, TMEM127, TP53*,TSC1, TSC2, VHL and XRCC2 (sequencing and deletion/duplication); EGFR, EGLN1, HOXB13, KIT, MITF, PDGFRA, POLD1 and POLE (sequencing only); EPCAM and GREM1 (deletion/duplication only). DNA and RNA analyses performed for * genes.   11/18/2021 - 11/18/2021 Chemotherapy   Patient is on Treatment Plan : BREAST Trastuzumab (8/6) IV D1 + Capecitabine +  Tucatinib q21d     11/18/2021 - 12/09/2021 Chemotherapy   Patient is on Treatment Plan : BREAST Trastuzumab (8/6) IV D1 + Capecitabine +  Tucatinib q21d     12/26/2021 -  Chemotherapy   Patient is on Treatment Plan : BREAST Trastuzumab (8/6) IV D1 + Capecitabine +  Tucatinib q21d       ALLERGIES:  has No Known Allergies.  MEDICATIONS:  Current Outpatient Medications  Medication Sig Dispense Refill   albuterol (VENTOLIN HFA) 108 (90 Base) MCG/ACT inhaler Inhale 2 puffs into the lungs every 6 (six) hours as needed for wheezing or shortness of breath. 8.5 g 2   benzoyl peroxide-erythromycin (BENZAMYCIN) gel Apply topically 2 (two) times daily. 46.6 g 0   butalbital-acetaminophen-caffeine (FIORICET) 50-325-40 MG tablet Take 1  tablet by mouth every 8 (eight) hours as needed for headache. 30 tablet 1   Calcium Carb-Cholecalciferol 600-10 MG-MCG TABS Take 1 tablet by mouth 2 (two) times daily. 180 tablet 0   capecitabine (XELODA) 500 MG tablet Take 3 tablets (1,500 mg total) by mouth 2 (two) times daily after a meal. Take for 14 days, then hold for 7 days. Repeat every 21 days. 84 tablet 2   diphenoxylate-atropine (LOMOTIL) 2.5-0.025 MG tablet Take 1 tablet by mouth 4 (four) times daily as needed for diarrhea or loose stools. 40 tablet 0   DULoxetine (CYMBALTA) 60 MG capsule Take 1 capsule (60 mg total) by mouth daily. 30 capsule 3   gabapentin (NEURONTIN) 600 MG tablet Take 1 tablet (600 mg total) by mouth 3 (three) times daily. 90 tablet 3   HYDROmorphone (DILAUDID) 2 MG tablet Take 0.5-1 tablets (1-2 mg total) by mouth every 4 (four) hours as needed for severe pain. 60 tablet 0   lidocaine-prilocaine (EMLA) cream Apply 1 Application topically as needed. Apply small amount to port site at least 1 hour prior to it being accessed, cover with plastic wrap 30 g 3   LORazepam (ATIVAN) 0.5 MG tablet Take 1 tablet (0.5 mg total) by mouth every 6 (six) hours as needed for anxiety (and nausea). AS NEEDED FOR NAUSEA 60 tablet 0  morphine (MS CONTIN) 30 MG 12 hr tablet Take 1 tablet (30 mg total) by mouth every 12 (twelve) hours. 60 tablet 0   naloxone (NARCAN) nasal spray 4 mg/0.1 mL Administer one spray in nostril for opioid overdose. If unresponsive after 1 dose, may repeat a dose every 2-3 minutes in alternating nostrils until emergency services arrive. 2 each 3   nitrofurantoin, macrocrystal-monohydrate, (MACROBID) 100 MG capsule Take 1 capsule (100 mg total) by mouth 2 (two) times daily. 10 capsule 0   OLANZapine (ZYPREXA) 10 MG tablet Take 1 tablet (10 mg total) by mouth at bedtime as needed (nausea). 30 tablet 2   omeprazole (PRILOSEC) 20 MG capsule Take 1 capsule (20 mg total) by mouth daily. 30 capsule 3   ondansetron  (ZOFRAN) 8 MG tablet Take 1 tablet (8 mg total) by mouth every 8 (eight) hours as needed for nausea or vomiting. 45 tablet 2   polyethylene glycol powder (MIRALAX) 17 GM/SCOOP powder Take 17 g by mouth daily as needed. 238 g 3   tucatinib (TUKYSA) 150 MG tablet Take 2 tablets (300 mg total) by mouth 2 (two) times daily. 120 tablet 3   No current facility-administered medications for this visit.   Facility-Administered Medications Ordered in Other Visits  Medication Dose Route Frequency Provider Last Rate Last Admin   0.9 %  sodium chloride infusion   Intravenous Continuous Jacquelin Hawking, NP   Stopped at 06/02/20 1453   0.9 %  sodium chloride infusion   Intravenous Once Sindy Guadeloupe, MD       acetaminophen (TYLENOL) tablet 650 mg  650 mg Oral Once Sindy Guadeloupe, MD       diphenhydrAMINE (BENADRYL) injection 25 mg  25 mg Intravenous Once Sindy Guadeloupe, MD       heparin lock flush 100 unit/mL  500 Units Intravenous Once Sindy Guadeloupe, MD       heparin lock flush 100 unit/mL  500 Units Intravenous Once Sindy Guadeloupe, MD       heparin lock flush 100 unit/mL  500 Units Intracatheter Once PRN Sindy Guadeloupe, MD       sodium chloride flush (NS) 0.9 % injection 10 mL  10 mL Intravenous Once Sindy Guadeloupe, MD       sodium chloride flush (NS) 0.9 % injection 10 mL  10 mL Intravenous Once Sindy Guadeloupe, MD       trastuzumab-anns [facility]) 378 mg in sodium chloride 0.9 % 250 mL chemo infusion  378 mg Intravenous Once Sindy Guadeloupe, MD        VITAL SIGNS: There were no vitals taken for this visit. There were no vitals filed for this visit.   Estimated body mass index is 23.34 kg/m as calculated from the following:   Height as of 04/24/22: 5\' 2"  (1.575 m).   Weight as of an earlier encounter on 06/02/22: 127 lb 9.6 oz (57.9 kg).  LABS: CBC:    Component Value Date/Time   WBC 3.7 (L) 06/02/2022 0902   HGB 11.7 (L) 06/02/2022 0902   HCT 34.8 (L) 06/02/2022 0902   PLT 416 (H) 06/02/2022  0902   MCV 88.1 06/02/2022 0902   NEUTROABS 2.2 06/02/2022 0902   LYMPHSABS 1.1 06/02/2022 0902   MONOABS 0.3 06/02/2022 0902   EOSABS 0.1 06/02/2022 0902   BASOSABS 0.0 06/02/2022 0902   Comprehensive Metabolic Panel:    Component Value Date/Time   NA 137 06/02/2022 0902   K 3.6  06/02/2022 0902   CL 106 06/02/2022 0902   CO2 24 06/02/2022 0902   BUN <5 (L) 06/02/2022 0902   CREATININE 0.51 06/02/2022 0902   GLUCOSE 90 06/02/2022 0902   CALCIUM 8.2 (L) 06/02/2022 0902   AST 22 06/02/2022 0902   ALT 11 06/02/2022 0902   ALKPHOS 89 06/02/2022 0902   BILITOT 0.5 06/02/2022 0902   PROT 7.8 06/02/2022 0902   ALBUMIN 3.3 (L) 06/02/2022 0902    RADIOGRAPHIC STUDIES: CT CHEST ABDOMEN PELVIS W CONTRAST  Result Date: 05/08/2022 CLINICAL DATA:  42 year old female with history of invasive breast cancer. Follow-up study. * Tracking Code: BO * EXAM: CT CHEST, ABDOMEN, AND PELVIS WITH CONTRAST TECHNIQUE: Multidetector CT imaging of the chest, abdomen and pelvis was performed following the standard protocol during bolus administration of intravenous contrast. RADIATION DOSE REDUCTION: This exam was performed according to the departmental dose-optimization program which includes automated exposure control, adjustment of the mA and/or kV according to patient size and/or use of iterative reconstruction technique. CONTRAST:  128mL OMNIPAQUE IOHEXOL 300 MG/ML  SOLN COMPARISON:  CT of the chest, abdomen and pelvis 01/31/2022. FINDINGS: CT CHEST FINDINGS Cardiovascular: Heart size is normal. There is no significant pericardial fluid, thickening or pericardial calcification. No atherosclerotic calcifications are noted in the thoracic aorta or the coronary arteries. Right internal jugular single-lumen Port-A-Cath with tip terminating in the right atrium. Mediastinum/Nodes: No pathologically enlarged mediastinal, hilar or internal mammary lymph nodes. Esophagus is unremarkable in appearance. No axillary  lymphadenopathy. Lungs/Pleura: Tiny 4 mm left lower lobe pulmonary nodule (axial image 100 of series 5), stable compared to the prior examination. No other larger more suspicious appearing pulmonary nodules or masses are noted. There are a few scattered areas of very mild peribronchovascular ground-glass attenuation, most evident in the left lower lobe, likely inflammatory in etiology. No confluent consolidative airspace disease. No pleural effusions. Musculoskeletal: Several well-defined sclerotic lesions are again noted throughout the visualized axial and appendicular skeleton, indicative of widespread metastatic disease to the bones. Overall burden of metastatic disease to the bones appears essentially unchanged compared to the prior study. Chronic compression fracture of T6 with 80% loss of central vertebral body height and post vertebroplasty changes, similar to the prior examination. CT ABDOMEN PELVIS FINDINGS Hepatobiliary: No suspicious cystic or solid hepatic lesions. No intra or extrahepatic biliary ductal dilatation. Gallbladder is normal in appearance. Pancreas: No pancreatic mass. No pancreatic ductal dilatation. No pancreatic or peripancreatic fluid collections or inflammatory changes. Spleen: Unremarkable. Adrenals/Urinary Tract: Bilateral kidneys and adrenal glands are normal in appearance. No hydroureteronephrosis. Urinary bladder is nearly completely decompressed, but otherwise unremarkable in appearance. Stomach/Bowel: The appearance of the stomach is normal. There is no pathologic dilatation of small bowel or colon. Normal appendix. Vascular/Lymphatic: No significant atherosclerotic disease, aneurysm or dissection noted in the abdominal or pelvic vasculature. No lymphadenopathy noted in the abdomen or pelvis. Reproductive: Uterus and ovaries are unremarkable in appearance. Other: No significant volume of ascites.  No pneumoperitoneum. Musculoskeletal: Multiple well-defined sclerotic lesions are  again noted throughout the visualized axial and appendicular skeleton, indicative of metastatic disease to the bones. Postoperative changes of ORIF in the right proximal femur partially imaged. Postprocedural changes of sacroplasty and augmentation of the right ilium are again noted. Chronic compression fractures of L1 and L4 are again noted, with post vertebroplasty changes at both levels. Up to 80% loss of central vertebral body height is again noted at L1 (unchanged). Some extrusion of cement is noted posterior to L4 impinging upon the  central spinal canal, similar to the prior study. No definite new osseous lesions are noted. IMPRESSION: 1. Widespread metastatic disease to the bones appears grossly similar to the prior examination. No new extraskeletal metastatic disease noted elsewhere in the chest, abdomen or pelvis. 2. 4 mm left lower lobe pulmonary nodule, stable compared to the prior study, likely benign. 3. Patchy areas of ground-glass attenuation are noted in the left lower lobe, likely of infectious or inflammatory etiology. 4. Additional incidental findings, as above, similar to the prior study. Electronically Signed   By: Vinnie Langton M.D.   On: 05/08/2022 08:34   MR Brain W Wo Contrast  Result Date: 05/06/2022 CLINICAL DATA:  History of breast cancer.  Headache EXAM: MRI HEAD WITHOUT AND WITH CONTRAST TECHNIQUE: Multiplanar, multiecho pulse sequences of the brain and surrounding structures were obtained without and with intravenous contrast. CONTRAST:  109mL GADAVIST GADOBUTROL 1 MMOL/ML IV SOLN COMPARISON:  03/01/2022 FINDINGS: BRAIN There are numerous enhancing brain lesions which have all been seen previously: 1.  inferior left cerebellum on 1031:21 2. Left posterior cerebellum on image 31 3. Right paramedian cerebellum on image 35 4. Right occipital white matter on 62 5. Right temporal occipital cortex on image 66 6. Left frontal operculum on image 65 7. Linear cortically based lesion at the  right lateral frontal lobe on image 86 8. Anterior left frontal cortex on image 99 9. Left frontal parietal cortex on image 108. 10. Anterior left frontal cortex on image 108 11. High right frontal cortex on image 125 12. High left posterior frontal cortex on image 133. All of these lesions show more fullness not completely explained by differences in technique. The left frontal operculum lesion is the largest and measures up to 14 mm craniocaudal common increased by 2 mm. No new brain lesion is seen. Chronic vasogenic edema in the left frontal lobe adjacent to the largest lesion with mild progression at and gyral thickening. Mild increase in left parietal vasogenic edema on 10:38. Decrease in superior left frontal cortex edema. Other Brain findings: Confluent FLAIR hyperintensity in the deep cerebral white matter. No acute infarct, acute hemorrhage, hydrocephalus, or collection Vascular: Major flow voids and vascular enhancements are preserved Skull and upper cervical spine: No focal marrow lesion. Sinuses/Orbits: Bilateral mastoid opacification which is progressed and may be treatment related. Negative nasopharynx. IMPRESSION: Multiple known enhancing brain metastases measuring up to 14 mm at the left frontal operculum with 1-2 mm of measurement increase affecting multiple lesions. No new lesion. Moderate tumor associated vasogenic edema in the left frontal lobe with mild progression. Mild and mildly progressed left parietal vasogenic edema. Electronically Signed   By: Jorje Guild M.D.   On: 05/06/2022 11:38   ECHOCARDIOGRAM COMPLETE  Result Date: 05/04/2022    ECHOCARDIOGRAM REPORT   Patient Name:   JALONI OSTERGREN [facility] Date of Exam: 05/04/2022 Medical Rec #:  [id number]             Height:       62.0 in Accession #:    [id number]            Weight:       132.0 lb Date of Birth:  Jun 17, 1980             BSA:          1.602 m Patient Age:    32 years              BP:  110/73 mmHg Patient Gender: F                      HR:           62 bpm. Exam Location:  [facility] Procedure: 2D Echo, Cardiac Doppler, Color Doppler and Strain Analysis Indications:     Chemo  History:         Patient has prior history of Echocardiogram examinations, most                  recent 02/08/2022. Breast CA with mets.  Sonographer:     Wenda Low Referring Phys:  [id number] Weston Anna RAO Diagnosing Phys: Ida Rogue MD  Sonographer Comments: Global longitudinal strain was attempted. IMPRESSIONS  1. Left ventricular ejection fraction, by estimation, is 55 to 60%. The left ventricle has normal function. The left ventricle has no regional wall motion abnormalities. Left ventricular diastolic parameters were normal. The average left ventricular global longitudinal strain is -18.7 %.  2. Right ventricular systolic function is normal. The right ventricular size is normal. There is normal pulmonary artery systolic pressure. The estimated right ventricular systolic pressure is [id number] mmHg.  3. The mitral valve is normal in structure. Mild mitral valve regurgitation. No evidence of mitral stenosis.  4. The aortic valve is tricuspid. Aortic valve regurgitation is not visualized. No aortic stenosis is present.  5. The inferior vena cava is normal in size with greater than 50% respiratory variability, suggesting right atrial pressure of 3 mmHg. FINDINGS  Left Ventricle: Left ventricular ejection fraction, by estimation, is 55 to 60%. The left ventricle has normal function. The left ventricle has no regional wall motion abnormalities. The average left ventricular global longitudinal strain is -18.7 %. The left ventricular internal cavity size was normal in size. There is no left ventricular hypertrophy. Left ventricular diastolic parameters were normal. Right Ventricle: The right ventricular size is normal. No increase in right ventricular wall thickness. Right ventricular systolic function is normal. There is normal pulmonary artery systolic  pressure. The tricuspid regurgitant velocity is 2.37 m/s, and  with an assumed right atrial pressure of 3 mmHg, the estimated right ventricular systolic pressure is [id number] mmHg. Left Atrium: Left atrial size was normal in size. Right Atrium: Right atrial size was normal in size. Pericardium: There is no evidence of pericardial effusion. Mitral Valve: The mitral valve is normal in structure. Mild mitral valve regurgitation. No evidence of mitral valve stenosis. MV peak gradient, 2.2 mmHg. The mean mitral valve gradient is 1.0 mmHg. Tricuspid Valve: The tricuspid valve is normal in structure. Tricuspid valve regurgitation is mild . No evidence of tricuspid stenosis. Aortic Valve: The aortic valve is tricuspid. Aortic valve regurgitation is not visualized. No aortic stenosis is present. Aortic valve mean gradient measures 4.0 mmHg. Aortic valve peak gradient measures 6.8 mmHg. Aortic valve area, by VTI measures 2.35 cm. Pulmonic Valve: The pulmonic valve was normal in structure. Pulmonic valve regurgitation is mild. No evidence of pulmonic stenosis. Aorta: The aortic root is normal in size and structure. Venous: The inferior vena cava is normal in size with greater than 50% respiratory variability, suggesting right atrial pressure of 3 mmHg. IAS/Shunts: No atrial level shunt detected by color flow Doppler.  LEFT VENTRICLE PLAX 2D LVIDd:         4.00 cm   Diastology LVIDs:         2.50 cm   LV e' medial:    8.81 cm/s LV PW:  1.00 cm   LV E/e' medial:  8.1 LV IVS:        1.00 cm   LV e' lateral:   14.80 cm/s LVOT diam:     1.90 cm   LV E/e' lateral: 4.8 LV SV:         56 LV SV Index:   35        2D Longitudinal Strain LVOT Area:     2.84 cm  2D Strain GLS Avg:     -18.7 %  RIGHT VENTRICLE RV Basal diam:  2.85 cm RV Mid diam:    2.80 cm RV S prime:     16.30 cm/s TAPSE (M-mode): 2.1 cm LEFT ATRIUM             Index        RIGHT ATRIUM           Index LA diam:        2.90 cm 1.81 cm/m   RA Area:     14.00 cm LA  Vol (A2C):   24.1 ml 15.04 ml/m  RA Volume:   34.30 ml  21.41 ml/m LA Vol (A4C):   19.6 ml 12.23 ml/m LA Biplane Vol: 22.9 ml 14.29 ml/m  AORTIC VALVE                    PULMONIC VALVE AV Area (Vmax):    2.31 cm     PV Vmax:       1.00 m/s AV Area (Vmean):   2.19 cm     PV Peak grad:  4.0 mmHg AV Area (VTI):     2.35 cm AV Vmax:           130.00 cm/s AV Vmean:          89.200 cm/s AV VTI:            0.238 m AV Peak Grad:      6.8 mmHg AV Mean Grad:      4.0 mmHg LVOT Vmax:         106.00 cm/s LVOT Vmean:        68.800 cm/s LVOT VTI:          0.197 m LVOT/AV VTI ratio: 0.83  AORTA Ao Root diam: 2.80 cm MITRAL VALVE               TRICUSPID VALVE MV Area (PHT): 4.19 cm    TR Peak grad:   22.5 mmHg MV Area VTI:   2.70 cm    TR Vmax:        237.00 cm/s MV Peak grad:  2.2 mmHg MV Mean grad:  1.0 mmHg    SHUNTS MV Vmax:       0.75 m/s    Systemic VTI:  0.20 m MV Vmean:      48.4 cm/s   Systemic Diam: 1.90 cm MV Decel Time: 181 msec MV E velocity: 71.30 cm/s MV A velocity: 70.20 cm/s MV E/A ratio:  1.02 Ida Rogue MD Electronically signed by Ida Rogue MD Signature Date/Time: 05/04/2022/12:22:09 PM    Final     PERFORMANCE STATUS (ECOG) : 2 - Symptomatic, <50% confined to bed  Review of Systems Unless otherwise noted, a complete review of systems is negative.  Physical Exam General: NAD Pulmonary: Unlabored Extremities: no edema, no joint deformities Skin: no rashes Neurological: Weakness but otherwise nonfocal  IMPRESSION: Patient seen in infusion.  Patient was an add-on to my clinic schedule today  to address pain.  Bone scan on 04/27/2022 showed evidence of progressed osseous metastatic disease with new activity in the distal left femoral shaft and increased activity in both femoral condyles.  Patient had progressed radiotracer activity in the proximal right femur, which is the site of her worsened pain.   Today, patient reports that she is doing reasonably well.  She denies any  significant changes or concerns.  She states that she has good days and bad days regarding her pain.  Patient says that she does not feel like she has much benefit from MS Contin but does find good pain control when she takes her hydromorphone.  However, she says that she is only taking her hydromorphone once or twice a day.  Encouraged her to take it up to every 4 hours as directed for breakthrough pain.  She verbalized understanding.  She denies any adverse effects or concerns from pain medications at this point.  PLAN: -Continue current scope of treatment -Continue MS Contin/hydromorphone -Could consider increasing MS Contin to every 8 hour dosing -Follow up telephone visit 1-2 months   Patient expressed understanding and was in agreement with this plan. She also understands that She can call the clinic at any time with any questions, concerns, or complaints.     Time Total: 15 minutes  Visit consisted of counseling and education dealing with the complex and emotionally intense issues of symptom management and palliative care in the setting of serious and potentially life-threatening illness.Greater than 50%  of this time was spent counseling and coordinating care related to the above assessment and plan.  Signed by: Altha Harm, PhD, NP-C

## 2022-06-02 NOTE — Patient Instructions (Signed)
Instrucciones al darle de alta: Discharge Instructions Gracias por elegir al [facility] de [facility] de [place] para brindarle atencin mdica de oncologa y Music therapist.   Si usted tiene una cita de laboratorio con [place], por favor vaya directamente [place] y regstrese en el rea de Control and instrumentation engineer.   Use ropa cmoda y [place] para tener fcil acceso a las vas del Portacath (acceso venoso de Engineer, site duracin) o la lnea PICC (catter central colocado por va perifrica).   Nos esforzamos por ofrecerle tiempo de calidad con su proveedor. Es posible que tenga que volver a programar su cita si llega tarde (15 minutos o ms).  El llegar tarde le afecta a usted y a otros pacientes cuyas citas son posteriores a Merchandiser, retail.  Adems, si usted falta a tres o ms citas sin avisar a la oficina, puede ser retirado(a) de la clnica a discrecin del proveedor.      Para las solicitudes de renovacin de recetas, pida a su farmacia que se ponga en contacto con nuestra oficina y deje que transcurran 21 horas para que se complete el proceso de las renovaciones.    Hoy usted recibi los siguientes agentes de quimioterapia e/o inmunoterapia [place]      Para ayudar a prevenir las nuseas y los vmitos despus de su tratamiento, le recomendamos que tome su medicamento para las nuseas segn las indicaciones.  LOS SNTOMAS QUE DEBEN COMUNICARSE INMEDIATAMENTE SE INDICAN A CONTINUACIN: *FIEBRE SUPERIOR A 100.4 F (38 C) O MS *ESCALOFROS O SUDORACIN *NUSEAS Y VMITOS QUE NO SE CONTROLAN CON EL MEDICAMENTO PARA LAS NUSEAS *DIFICULTAD INUSUAL PARA RESPIRAR  *MORETONES O HEMORRAGIAS NO HABITUALES *PROBLEMAS URINARIOS (dolor o ardor al Garment/textile technologist o frecuencia para Garment/textile technologist) *PROBLEMAS INTESTINALES (diarrea inusual, estreimiento, dolor cerca del ano) SENSIBILIDAD EN LA BOCA Y EN LA GARGANTA CON O SIN LA PRESENCIA DE LCERAS (dolor de garganta, llagas en la boca o dolor de muelas/dientes) ERUPCIN,  HINCHAZN O DOLORES INUSUALES FLUJO VAGINAL INUSUAL O PICAZN/RASQUIA    Los puntos marcados con un asterisco ( *) indican una posible emergencia y debe hacer un seguimiento tan pronto como le sea posible o vaya al Departamento de Emergencias si se le presenta algn problema.  Por favor, muestre la [place] DE ADVERTENCIA DE Windy Canny DE ADVERTENCIA DE Benay Spice al registrarse en [address] de Emergencias y a la enfermera de triaje.  Si tiene preguntas despus de su visita o necesita cancelar o volver a programar su cita, por favor pngase en contacto con [place]  y Gantt instrucciones. Las horas de oficina son de 8:00 a.m. a 4:30 p.m. de lunes a viernes. Por favor, tenga en cuenta que los mensajes de voz que se dejan despus de las 4:00 p.m. posiblemente no se devolvern hasta el siguiente da de [place].  Cerramos los fines de semana y [employer]. En todo momento tiene acceso a una enfermera para preguntas urgentes. Por favor, llame al nmero principal de la clnica  [phone] y [place] instrucciones.   Para cualquier pregunta que no sea de carcter urgente, tambin puede ponerse en contacto con su proveedor [employer] Ahora ofrecemos visitas electrnicas para cualquier persona mayor de 18 aos que solicite atencin mdica en lnea para los sntomas que no sean urgentes. Para ms detalles vaya a [email or web address].   Tambin puede bajar la aplicacin de MyChart! Vaya a la tienda de aplicaciones, busque "MyChart", abra la aplicacin,  seleccione [place], e ingrese con su nombre de usuario y la contrasea de MyChart.   

## 2022-06-02 NOTE — Progress Notes (Signed)
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  History obtained with the help of an interpreter.Patient self palpated left breast mass which was followed by a diagnostic bilateral mammogram.  Mammogram showed 3.1 x 2.9 x 1.9 cm hypoechoic mass at the 1 o'clock position of the left breast.  For abnormal cortically thickened left axillary lymph nodes measuring up to 5 mm.  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  Patient was also having ongoing back pain and was seen by [facility] orthopedics Dr. Doyle Askew who ordered MRI lumbar spine without contrast which showed possible pathologic fractures of L1 and L4 vertebral bodies with greater than 50% height loss at L1 and abnormal signal involving L2-L3 S1 as well as right iliac bone concerning for metastatic disease.  Patient is a single mother of 3 adult children and is very anxious today.  She reports significant back pain which radiates to her bilateral thighs.  Denies any focal tingling numbness or weakness.  Denies any bowel bladder incontinence.  Pain has been uncontrolled despite taking Tylenol.  No prior history of abnormal breast biopsies.  No family history of breast  cancer   PET and MRI showed 3 areas of pathologic fracture of her spine as well as widespread bony metastatic disease and concern for impending fracture of the right hip.  Given her worsening pain she was asked to come to the ER.  She has been evaluated by Dr. Rudene Christians from orthopedic surgery and underwent kyphoplasty at 3 different levels. T6 L1 and L4 along with radiofrequency ablation.    She also underwent prophylactic fixation of the right hip and not affected the sacral region.   Patient received first dose of Herceptin and Perjeta on 09/04/2019.  Patient initially received Taxol and Herceptin perjeta.  Presently patient is on maintenance Herceptin and Perjeta alone.  Patient found to have brain mets in November 2022 for which she received whole brain radiation treatment but there was no evidence of systemic progression.  She was continued on Herceptin and Perjeta.  Repeat MRI in June 2023 showed slight progression of brain mets.  Patient was switched to Herceptin tucatinib Xeloda    Interval history- Patient Is 42 year old female with above history of metastaic breast cancer who returns to cancer center for consideration of continuation of treatment. She continues to complain of pain of lower legs and right hip, particularly when exerting herself. Would like to see [name] to discuss pain medicine. Says that [facility] told her to continue treatments. Complains of itchy skin, worse at night. Has a bump on her scalp under the skin that she tried squeezing. Had a bump under her breast, possible pimple, that has now healed.   ECOG PS- 2 Pain scale- 0 Opioid  associated constipation- no  Review of systems- Review of Systems  Constitutional:  Positive for malaise/fatigue. Negative for chills, fever and weight loss.  HENT:  Negative for congestion, ear discharge and nosebleeds.   Eyes:  Negative for blurred vision.  Respiratory:  Negative for cough, hemoptysis, sputum production, shortness of breath and wheezing.    Cardiovascular:  Negative for chest pain, palpitations, orthopnea and claudication.  Gastrointestinal:  Negative for abdominal pain, blood in stool, constipation, diarrhea, heartburn, melena, nausea and vomiting.  Genitourinary:  Negative for dysuria, flank pain, frequency, hematuria and urgency.  Musculoskeletal:  Positive for joint pain. Negative for back pain and myalgias.       Right hip pain  Skin:  Positive for itching. Negative for rash.  Neurological:  Negative for dizziness, tingling, focal weakness, seizures, weakness and headaches.  Endo/Heme/Allergies:  Does not bruise/bleed easily.  Psychiatric/Behavioral:  Negative for depression and suicidal ideas. The patient does not have insomnia.     No Known Allergies  Past Medical History:  Diagnosis Date   Anxiety    Breast cancer ([place])    with mets   Cancer ([place])    Colitis    COVID-19 in immunocompromised patient ([place])    Family history of colon cancer    Vertigo    Past Surgical History:  Procedure Laterality Date   BREAST BIOPSY Left 08/14/2019   [place] bx of mass, coil marker, path pending   BREAST BIOPSY Left 08/14/2019   [place] bx of LN, hydromarker, path pending   BREAST BIOPSY Left 08/14/2019   affirm bx of calcs, x marker, path pending   ESOPHAGOGASTRODUODENOSCOPY (EGD) WITH PROPOFOL N/A 10/05/2019   Procedure: ESOPHAGOGASTRODUODENOSCOPY (EGD) WITH PROPOFOL;  Surgeon: Lin Landsman, MD;  Location: [place];  Service: Gastroenterology;  Laterality: N/A;   FLEXIBLE SIGMOIDOSCOPY N/A 10/05/2019   Procedure: FLEXIBLE SIGMOIDOSCOPY;  Surgeon: Lin Landsman, MD;  Location: [facility];  Service: Gastroenterology;  Laterality: N/A;   INTRAMEDULLARY (IM) NAIL INTERTROCHANTERIC Right 09/01/2019   Procedure: INTRAMEDULLARY (IM) NAIL INTERTROCHANTRIC AND RADIOFREQUENCY ABLATION;  Surgeon: Hessie Knows, MD;  Location: [facility];  Service: Orthopedics;  Laterality: Right;   KYPHOPLASTY N/A 08/29/2019   Procedure:  KYPHOPLASTY T6, L1,L4 ,  RADIOFREQUENCY ABLATION;  Surgeon: Hessie Knows, MD;  Location: [facility];  Service: Orthopedics;  Laterality: N/A;   KYPHOPLASTY Right 09/01/2019   Procedure: Right Sacral Radiofrequency Ablation and Cement Augmentation, Right sacrum and iliac crest;  Surgeon: Hessie Knows, MD;  Location: [facility];  Service: Orthopedics;  Laterality: Right;   PORTA CATH INSERTION N/A 08/28/2019   Procedure: PORTA CATH INSERTION;  Surgeon: Algernon Huxley, MD;  Location: [place] [facility];  Service: Cardiovascular;  Laterality: N/A;   Social History   Socioeconomic History   Marital status: Single    Spouse name: Not on file   Number of children: Not on file   Years of education: Not on file   Highest education level: Not on file  Occupational History   Not on file  Tobacco Use   Smoking status: Never   Smokeless tobacco: Never  Vaping Use   Vaping Use: Never used  Substance and Sexual Activity   Alcohol use: Not Currently   Drug use: Not Currently   Sexual activity: Not Currently    Birth control/protection: None  Other Topics Concern   Not on file  Social History Narrative   Lives at home with children   Social Determinants of Health   Financial Resource Strain: High  Risk (11/18/2021)   Overall Financial Resource Strain (CARDIA)    Difficulty of Paying Living Expenses: Very hard  Food Insecurity: Food Insecurity Present (12/26/2021)   Hunger Vital Sign    Worried About Running Out of Food in the Last Year: Often true    Ran Out of Food in the Last Year: Often true  Transportation Needs: Unmet Transportation Needs (05/10/2022)   PRAPARE - Hydrologist (Medical): Yes    Lack of Transportation (Non-Medical): Yes  Physical Activity: Inactive (11/18/2021)   Exercise Vital Sign    Days of Exercise per Week: 0 days    Minutes of Exercise per Session: 0 min  Stress: Stress Concern Present (11/18/2021)   [place]    Feeling of Stress : Very much  Social Connections: Socially Isolated (11/18/2021)   Social Connection and Isolation Panel [NHANES]    Frequency of Communication with Friends and Family: Three times a week    Frequency of Social Gatherings with Friends and Family: Three times a week    Attends Religious Services: Never    Active Member of Clubs or Organizations: No    Attends Archivist Meetings: Never    Marital Status: Never married  Intimate Partner Violence: Not At Risk (11/18/2021)   Humiliation, Afraid, Rape, and Kick questionnaire    Fear of Current or Ex-Partner: No    Emotionally Abused: No    Physically Abused: No    Sexually Abused: No   Family History  Problem Relation Age of Onset   Colon cancer Maternal Uncle    Current Outpatient Medications:    albuterol (VENTOLIN HFA) 108 (90 Base) MCG/ACT inhaler, Inhale 2 puffs into the lungs every 6 (six) hours as needed for wheezing or shortness of breath., Disp: 8.5 g, Rfl: 2   benzoyl peroxide-erythromycin (BENZAMYCIN) gel, Apply topically 2 (two) times daily., Disp: 46.6 g, Rfl: 0   butalbital-acetaminophen-caffeine (FIORICET) 50-325-40 MG tablet, Take 1 tablet by mouth every 8 (eight) hours as needed for headache., Disp: 30 tablet, Rfl: 1   Calcium Carb-Cholecalciferol 600-10 MG-MCG TABS, Take 1 tablet by mouth 2 (two) times daily., Disp: 180 tablet, Rfl: 0   capecitabine (XELODA) 500 MG tablet, Take 3 tablets (1,500 mg total) by mouth 2 (two) times daily after a meal. Take for 14 days, then hold for 7 days. Repeat every 21 days., Disp: 84 tablet, Rfl: 2   diphenoxylate-atropine (LOMOTIL) 2.5-0.025 MG tablet, Take 1 tablet by mouth 4 (four) times daily as needed for diarrhea or loose stools., Disp: 40 tablet, Rfl: 0   DULoxetine (CYMBALTA) 60 MG capsule, Take 1 capsule (60 mg total) by mouth daily., Disp: 30 capsule, Rfl: 3   gabapentin (NEURONTIN) 600 MG tablet, Take 1 tablet (600 mg  total) by mouth 3 (three) times daily., Disp: 90 tablet, Rfl: 3   HYDROmorphone (DILAUDID) 2 MG tablet, Take 0.5-1 tablets (1-2 mg total) by mouth every 4 (four) hours as needed for severe pain., Disp: 60 tablet, Rfl: 0   lidocaine-prilocaine (EMLA) cream, Apply 1 Application topically as needed. Apply small amount to port site at least 1 hour prior to it being accessed, cover with plastic wrap, Disp: 30 g, Rfl: 3   LORazepam (ATIVAN) 0.5 MG tablet, Take 1 tablet (0.5 mg total) by mouth every 6 (six) hours as needed for anxiety (and nausea). AS NEEDED FOR NAUSEA, Disp: 60 tablet, Rfl: 0   morphine (MS CONTIN) 30  MG 12 hr tablet, Take 1 tablet (30 mg total) by mouth every 12 (twelve) hours., Disp: 60 tablet, Rfl: 0   naloxone (NARCAN) nasal spray 4 mg/0.1 mL, Administer one spray in nostril for opioid overdose. If unresponsive after 1 dose, may repeat a dose every 2-3 minutes in alternating nostrils until emergency services arrive., Disp: 2 each, Rfl: 3   nitrofurantoin, macrocrystal-monohydrate, (MACROBID) 100 MG capsule, Take 1 capsule (100 mg total) by mouth 2 (two) times daily., Disp: 10 capsule, Rfl: 0   OLANZapine (ZYPREXA) 10 MG tablet, Take 1 tablet (10 mg total) by mouth at bedtime as needed (nausea)., Disp: 30 tablet, Rfl: 2   omeprazole (PRILOSEC) 20 MG capsule, Take 1 capsule (20 mg total) by mouth daily., Disp: 30 capsule, Rfl: 3   ondansetron (ZOFRAN) 8 MG tablet, Take 1 tablet (8 mg total) by mouth every 8 (eight) hours as needed for nausea or vomiting., Disp: 45 tablet, Rfl: 2   polyethylene glycol powder (MIRALAX) 17 GM/SCOOP powder, Take 17 g by mouth daily as needed., Disp: 238 g, Rfl: 3   tucatinib (TUKYSA) 150 MG tablet, Take 2 tablets (300 mg total) by mouth 2 (two) times daily., Disp: 120 tablet, Rfl: 3 No current facility-administered medications for this visit.  Facility-Administered Medications Ordered in Other Visits:    0.9 %  sodium chloride infusion, , Intravenous,  Continuous, Burns, Wandra Feinstein, NP, Stopped at 06/02/20 1453   heparin lock flush 100 unit/mL, 500 Units, Intravenous, Once, Sophia Guadeloupe, MD   heparin lock flush 100 unit/mL, 500 Units, Intravenous, Once, Sophia Guadeloupe, MD   sodium chloride flush (NS) 0.9 % injection 10 mL, 10 mL, Intravenous, Once, Sophia Guadeloupe, MD   sodium chloride flush (NS) 0.9 % injection 10 mL, 10 mL, Intravenous, Once, Sophia Guadeloupe, MD  Physical exam:  Vitals:   06/02/22 0933  BP: 108/67  Pulse: 67  Resp: 18  Temp: 98.5 F (36.9 C)  SpO2: 100%  Weight: 127 lb 9.6 oz (57.9 kg)   Physical Exam Constitutional:      Comments: Walks with a limp  Cardiovascular:     Rate and Rhythm: Normal rate and regular rhythm.  Pulmonary:     Effort: Pulmonary effort is normal.  Abdominal:     General: There is no distension.     Tenderness: There is no abdominal tenderness.  Musculoskeletal:        General: Deformity present.  Skin:    General: Skin is warm and dry.     Findings: No lesion or rash.  Neurological:     Mental Status: She is alert and oriented to person, place, and time.  Psychiatric:        Mood and Affect: Mood normal.        Behavior: Behavior normal.        Latest Ref Rng & Units 06/02/2022    9:02 AM  CMP  Glucose 70 - 99 mg/dL 90   BUN 6 - 20 mg/dL <5   Creatinine 0.44 - 1.00 mg/dL 0.51   Sodium 135 - 145 mmol/L 137   Potassium 3.5 - 5.1 mmol/L 3.6   Chloride 98 - 111 mmol/L 106   CO2 22 - 32 mmol/L 24   Calcium 8.9 - 10.3 mg/dL 8.2   Total Protein 6.5 - 8.1 g/dL 7.8   Total Bilirubin 0.3 - 1.2 mg/dL 0.5   Alkaline Phos 38 - 126 U/L 89   AST 15 - 41 U/L 22  ALT 0 - 44 U/L 11       Latest Ref Rng & Units 06/02/2022    9:02 AM  CBC  WBC 4.0 - 10.5 K/uL 3.7   Hemoglobin 12.0 - 15.0 g/dL 11.7   Hematocrit 36.0 - 46.0 % 34.8   Platelets 150 - 400 K/uL 416     No images are attached to the encounter.  CT CHEST ABDOMEN PELVIS W CONTRAST  Result Date: 05/08/2022 CLINICAL  DATA:  42 year old female with history of invasive breast cancer. Follow-up study. * Tracking Code: BO * EXAM: CT CHEST, ABDOMEN, AND PELVIS WITH CONTRAST TECHNIQUE: Multidetector CT imaging of the chest, abdomen and pelvis was performed following the standard protocol during bolus administration of intravenous contrast. RADIATION DOSE REDUCTION: This exam was performed according to the departmental dose-optimization program which includes automated exposure control, adjustment of the mA and/or kV according to patient size and/or use of iterative reconstruction technique. CONTRAST:  122mL OMNIPAQUE IOHEXOL 300 MG/ML  SOLN COMPARISON:  CT of the chest, abdomen and pelvis 01/31/2022. FINDINGS: CT CHEST FINDINGS Cardiovascular: Heart size is normal. There is no significant pericardial fluid, thickening or pericardial calcification. No atherosclerotic calcifications are noted in the thoracic aorta or the coronary arteries. Right internal jugular single-lumen Port-A-Cath with tip terminating in the right atrium. Mediastinum/Nodes: No pathologically enlarged mediastinal, hilar or internal mammary lymph nodes. Esophagus is unremarkable in appearance. No axillary lymphadenopathy. Lungs/Pleura: Tiny 4 mm left lower lobe pulmonary nodule (axial image 100 of series 5), stable compared to the prior examination. No other larger more suspicious appearing pulmonary nodules or masses are noted. There are a few scattered areas of very mild peribronchovascular ground-glass attenuation, most evident in the left lower lobe, likely inflammatory in etiology. No confluent consolidative airspace disease. No pleural effusions. Musculoskeletal: Several well-defined sclerotic lesions are again noted throughout the visualized axial and appendicular skeleton, indicative of widespread metastatic disease to the bones. Overall burden of metastatic disease to the bones appears essentially unchanged compared to the prior study. Chronic compression  fracture of T6 with 80% loss of central vertebral body height and post vertebroplasty changes, similar to the prior examination. CT ABDOMEN PELVIS FINDINGS Hepatobiliary: No suspicious cystic or solid hepatic lesions. No intra or extrahepatic biliary ductal dilatation. Gallbladder is normal in appearance. Pancreas: No pancreatic mass. No pancreatic ductal dilatation. No pancreatic or peripancreatic fluid collections or inflammatory changes. Spleen: Unremarkable. Adrenals/Urinary Tract: Bilateral kidneys and adrenal glands are normal in appearance. No hydroureteronephrosis. Urinary bladder is nearly completely decompressed, but otherwise unremarkable in appearance. Stomach/Bowel: The appearance of the stomach is normal. There is no pathologic dilatation of small bowel or colon. Normal appendix. Vascular/Lymphatic: No significant atherosclerotic disease, aneurysm or dissection noted in the abdominal or pelvic vasculature. No lymphadenopathy noted in the abdomen or pelvis. Reproductive: Uterus and ovaries are unremarkable in appearance. Other: No significant volume of ascites.  No pneumoperitoneum. Musculoskeletal: Multiple well-defined sclerotic lesions are again noted throughout the visualized axial and appendicular skeleton, indicative of metastatic disease to the bones. Postoperative changes of ORIF in the right proximal femur partially imaged. Postprocedural changes of sacroplasty and augmentation of the right ilium are again noted. Chronic compression fractures of L1 and L4 are again noted, with post vertebroplasty changes at both levels. Up to 80% loss of central vertebral body height is again noted at L1 (unchanged). Some extrusion of cement is noted posterior to L4 impinging upon the central spinal canal, similar to the prior study. No definite new osseous lesions are  noted. IMPRESSION: 1. Widespread metastatic disease to the bones appears grossly similar to the prior examination. No new extraskeletal  metastatic disease noted elsewhere in the chest, abdomen or pelvis. 2. 4 mm left lower lobe pulmonary nodule, stable compared to the prior study, likely benign. 3. Patchy areas of ground-glass attenuation are noted in the left lower lobe, likely of infectious or inflammatory etiology. 4. Additional incidental findings, as above, similar to the prior study. Electronically Signed   By: Vinnie Langton M.D.   On: 05/08/2022 08:34   MR Brain W Wo Contrast  Result Date: 05/06/2022 CLINICAL DATA:  History of breast cancer.  Headache EXAM: MRI HEAD WITHOUT AND WITH CONTRAST TECHNIQUE: Multiplanar, multiecho pulse sequences of the brain and surrounding structures were obtained without and with intravenous contrast. CONTRAST:  76mL GADAVIST GADOBUTROL 1 MMOL/ML IV SOLN COMPARISON:  03/01/2022 FINDINGS: BRAIN There are numerous enhancing brain lesions which have all been seen previously: 1.  inferior left cerebellum on 1031:21 2. Left posterior cerebellum on image 31 3. Right paramedian cerebellum on image 35 4. Right occipital white matter on 62 5. Right temporal occipital cortex on image 66 6. Left frontal operculum on image 65 7. Linear cortically based lesion at the right lateral frontal lobe on image 86 8. Anterior left frontal cortex on image 99 9. Left frontal parietal cortex on image 108. 10. Anterior left frontal cortex on image 108 11. High right frontal cortex on image 125 12. High left posterior frontal cortex on image 133. All of these lesions show more fullness not completely explained by differences in technique. The left frontal operculum lesion is the largest and measures up to 14 mm craniocaudal common increased by 2 mm. No new brain lesion is seen. Chronic vasogenic edema in the left frontal lobe adjacent to the largest lesion with mild progression at and gyral thickening. Mild increase in left parietal vasogenic edema on 10:38. Decrease in superior left frontal cortex edema. Other Brain findings:  Confluent FLAIR hyperintensity in the deep cerebral white matter. No acute infarct, acute hemorrhage, hydrocephalus, or collection Vascular: Major flow voids and vascular enhancements are preserved Skull and upper cervical spine: No focal marrow lesion. Sinuses/Orbits: Bilateral mastoid opacification which is progressed and may be treatment related. Negative nasopharynx. IMPRESSION: Multiple known enhancing brain metastases measuring up to 14 mm at the left frontal operculum with 1-2 mm of measurement increase affecting multiple lesions. No new lesion. Moderate tumor associated vasogenic edema in the left frontal lobe with mild progression. Mild and mildly progressed left parietal vasogenic edema. Electronically Signed   By: Jorje Guild M.D.   On: 05/06/2022 11:38   ECHOCARDIOGRAM COMPLETE  Result Date: 05/04/2022    ECHOCARDIOGRAM REPORT   Patient Name:   DATRA SCHRAUBEN [facility] Date of Exam: 05/04/2022 Medical Rec #:  [id number]             Height:       62.0 in Accession #:    [id number]            Weight:       132.0 lb Date of Birth:  09/09/1980             BSA:          1.602 m Patient Age:    37 years              BP:           110/73 mmHg Patient Gender: F  HR:           62 bpm. Exam Location:  [facility] Procedure: 2D Echo, Cardiac Doppler, Color Doppler and Strain Analysis Indications:     Chemo  History:         Patient has prior history of Echocardiogram examinations, most                  recent 02/08/2022. Breast CA with mets.  Sonographer:     Wenda Low Referring Phys:  [id number] Weston Anna RAO Diagnosing Phys: Ida Rogue MD  Sonographer Comments: Global longitudinal strain was attempted. IMPRESSIONS  1. Left ventricular ejection fraction, by estimation, is 55 to 60%. The left ventricle has normal function. The left ventricle has no regional wall motion abnormalities. Left ventricular diastolic parameters were normal. The average left ventricular global longitudinal strain is  -18.7 %.  2. Right ventricular systolic function is normal. The right ventricular size is normal. There is normal pulmonary artery systolic pressure. The estimated right ventricular systolic pressure is [id number] mmHg.  3. The mitral valve is normal in structure. Mild mitral valve regurgitation. No evidence of mitral stenosis.  4. The aortic valve is tricuspid. Aortic valve regurgitation is not visualized. No aortic stenosis is present.  5. The inferior vena cava is normal in size with greater than 50% respiratory variability, suggesting right atrial pressure of 3 mmHg. FINDINGS  Left Ventricle: Left ventricular ejection fraction, by estimation, is 55 to 60%. The left ventricle has normal function. The left ventricle has no regional wall motion abnormalities. The average left ventricular global longitudinal strain is -18.7 %. The left ventricular internal cavity size was normal in size. There is no left ventricular hypertrophy. Left ventricular diastolic parameters were normal. Right Ventricle: The right ventricular size is normal. No increase in right ventricular wall thickness. Right ventricular systolic function is normal. There is normal pulmonary artery systolic pressure. The tricuspid regurgitant velocity is 2.37 m/s, and  with an assumed right atrial pressure of 3 mmHg, the estimated right ventricular systolic pressure is [id number] mmHg. Left Atrium: Left atrial size was normal in size. Right Atrium: Right atrial size was normal in size. Pericardium: There is no evidence of pericardial effusion. Mitral Valve: The mitral valve is normal in structure. Mild mitral valve regurgitation. No evidence of mitral valve stenosis. MV peak gradient, 2.2 mmHg. The mean mitral valve gradient is 1.0 mmHg. Tricuspid Valve: The tricuspid valve is normal in structure. Tricuspid valve regurgitation is mild . No evidence of tricuspid stenosis. Aortic Valve: The aortic valve is tricuspid. Aortic valve regurgitation is not visualized. No  aortic stenosis is present. Aortic valve mean gradient measures 4.0 mmHg. Aortic valve peak gradient measures 6.8 mmHg. Aortic valve area, by VTI measures 2.35 cm. Pulmonic Valve: The pulmonic valve was normal in structure. Pulmonic valve regurgitation is mild. No evidence of pulmonic stenosis. Aorta: The aortic root is normal in size and structure. Venous: The inferior vena cava is normal in size with greater than 50% respiratory variability, suggesting right atrial pressure of 3 mmHg. IAS/Shunts: No atrial level shunt detected by color flow Doppler.  LEFT VENTRICLE PLAX 2D LVIDd:         4.00 cm   Diastology LVIDs:         2.50 cm   LV e' medial:    8.81 cm/s LV PW:         1.00 cm   LV E/e' medial:  8.1 LV IVS:  1.00 cm   LV e' lateral:   14.80 cm/s LVOT diam:     1.90 cm   LV E/e' lateral: 4.8 LV SV:         56 LV SV Index:   33        2D Longitudinal Strain LVOT Area:     2.84 cm  2D Strain GLS Avg:     -18.7 %  RIGHT VENTRICLE RV Basal diam:  2.85 cm RV Mid diam:    2.80 cm RV S prime:     16.30 cm/s TAPSE (M-mode): 2.1 cm LEFT ATRIUM             Index        RIGHT ATRIUM           Index LA diam:        2.90 cm 1.81 cm/m   RA Area:     14.00 cm LA Vol (A2C):   24.1 ml 15.04 ml/m  RA Volume:   34.30 ml  21.41 ml/m LA Vol (A4C):   19.6 ml 12.23 ml/m LA Biplane Vol: 22.9 ml 14.29 ml/m  AORTIC VALVE                    PULMONIC VALVE AV Area (Vmax):    2.31 cm     PV Vmax:       1.00 m/s AV Area (Vmean):   2.19 cm     PV Peak grad:  4.0 mmHg AV Area (VTI):     2.35 cm AV Vmax:           130.00 cm/s AV Vmean:          89.200 cm/s AV VTI:            0.238 m AV Peak Grad:      6.8 mmHg AV Mean Grad:      4.0 mmHg LVOT Vmax:         106.00 cm/s LVOT Vmean:        68.800 cm/s LVOT VTI:          0.197 m LVOT/AV VTI ratio: 0.83  AORTA Ao Root diam: 2.80 cm MITRAL VALVE               TRICUSPID VALVE MV Area (PHT): 4.19 cm    TR Peak grad:   22.5 mmHg MV Area VTI:   2.70 cm    TR Vmax:        237.00 cm/s  MV Peak grad:  2.2 mmHg MV Mean grad:  1.0 mmHg    SHUNTS MV Vmax:       0.75 m/s    Systemic VTI:  0.20 m MV Vmean:      48.4 cm/s   Systemic Diam: 1.90 cm MV Decel Time: 181 msec MV E velocity: 71.30 cm/s MV A velocity: 70.20 cm/s MV E/A ratio:  1.02 Ida Rogue MD Electronically signed by Ida Rogue MD Signature Date/Time: 05/04/2022/12:22:09 PM    Final      Assessment and plan- Patient is a 41 y.o. 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  She is here to for consideration of   She is s/p second opinion with Dr. Emelia Loron at [facility]. Given no progression on MRI brain or CT CAP with increased activity limited to bone scan only, reasonable to continue current HER2 regimen with close follow up imaging. Plan for imaging end of the  month. Re-review of imaging at [facility] felt that bone scan was stable with similar metastatic disease and no new evidence of disease elsewhere. 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 At time of CNS progression, rad onc reevaluation to determine if candidate for [facility]. Hold switching her to [facility] for now.   Labs reviewed and acceptable for continuation of treatment. Proceed with herceptin, xeloda, and tucatinib.   Cancer related pain- she will see [name] today for add on for management of her pain medication.   Disposition:  Follow up with Dr Janese Banks as scheduled in 3 weeks for consideration of continuation of treatment.   Visit Diagnosis 1. Multiple pathological fractures, initial encounter   2. Primary malignant neoplasm of breast with metastasis ([place])    Beckey Rutter, [place], AGNP-C, [place] at [facility] [phone] (clinic) 06/02/2022

## 2022-06-02 NOTE — Progress Notes (Signed)
Patient states that she has some anxiety and she has had some itching all over especially her knees, patches on her head and on left breast, she also complains of throbbing in the soles of both feet.

## 2022-06-06 ENCOUNTER — Other Ambulatory Visit: Payer: Self-pay | Admitting: Pharmacist

## 2022-06-06 ENCOUNTER — Encounter: Payer: Self-pay | Admitting: Oncology

## 2022-06-06 ENCOUNTER — Other Ambulatory Visit (HOSPITAL_COMMUNITY): Payer: Self-pay

## 2022-06-06 ENCOUNTER — Other Ambulatory Visit: Payer: Self-pay

## 2022-06-06 DIAGNOSIS — C50919 Malignant neoplasm of unspecified site of unspecified female breast: Secondary | ICD-10-CM

## 2022-06-06 MED ORDER — CAPECITABINE 500 MG PO TABS
1500.0000 mg | ORAL_TABLET | Freq: Two times a day (BID) | ORAL | 2 refills | Status: DC
  Filled 2022-06-06: qty 84, 14d supply, fill #0
  Filled 2022-06-27: qty 84, 21d supply, fill #1

## 2022-06-07 ENCOUNTER — Encounter: Payer: Self-pay | Admitting: Medical Oncology

## 2022-06-07 ENCOUNTER — Encounter: Payer: Self-pay | Admitting: Oncology

## 2022-06-07 ENCOUNTER — Other Ambulatory Visit: Payer: Self-pay | Admitting: *Deleted

## 2022-06-07 ENCOUNTER — Telehealth: Payer: Self-pay | Admitting: *Deleted

## 2022-06-07 ENCOUNTER — Other Ambulatory Visit (HOSPITAL_COMMUNITY): Payer: Self-pay

## 2022-06-07 ENCOUNTER — Inpatient Hospital Stay (HOSPITAL_BASED_OUTPATIENT_CLINIC_OR_DEPARTMENT_OTHER): Payer: Self-pay | Admitting: Medical Oncology

## 2022-06-07 ENCOUNTER — Inpatient Hospital Stay: Payer: Self-pay

## 2022-06-07 ENCOUNTER — Other Ambulatory Visit: Payer: Self-pay

## 2022-06-07 VITALS — BP 104/74 | HR 71 | Temp 97.9°F | Resp 16 | Ht 62.0 in | Wt 125.0 lb

## 2022-06-07 DIAGNOSIS — R300 Dysuria: Secondary | ICD-10-CM

## 2022-06-07 DIAGNOSIS — Z1620 Resistance to unspecified antibiotic: Secondary | ICD-10-CM

## 2022-06-07 DIAGNOSIS — N390 Urinary tract infection, site not specified: Secondary | ICD-10-CM

## 2022-06-07 DIAGNOSIS — Z5112 Encounter for antineoplastic immunotherapy: Secondary | ICD-10-CM | POA: Diagnosis not present

## 2022-06-07 LAB — CBC WITH DIFFERENTIAL/PLATELET
Abs Immature Granulocytes: 0.06 10*3/uL (ref 0.00–0.07)
Basophils Absolute: 0 10*3/uL (ref 0.0–0.1)
Basophils Relative: 1 %
Eosinophils Absolute: 0.1 10*3/uL (ref 0.0–0.5)
Eosinophils Relative: 2 %
HCT: 35.5 % — ABNORMAL LOW (ref 36.0–46.0)
Hemoglobin: 11.7 g/dL — ABNORMAL LOW (ref 12.0–15.0)
Immature Granulocytes: 1 %
Lymphocytes Relative: 28 %
Lymphs Abs: 1.2 10*3/uL (ref 0.7–4.0)
MCH: 28.8 pg (ref 26.0–34.0)
MCHC: 33 g/dL (ref 30.0–36.0)
MCV: 87.4 fL (ref 80.0–100.0)
Monocytes Absolute: 0.4 10*3/uL (ref 0.1–1.0)
Monocytes Relative: 9 %
Neutro Abs: 2.6 10*3/uL (ref 1.7–7.7)
Neutrophils Relative %: 59 %
Platelets: 429 10*3/uL — ABNORMAL HIGH (ref 150–400)
RBC: 4.06 MIL/uL (ref 3.87–5.11)
RDW: 12.8 % (ref 11.5–15.5)
WBC: 4.3 10*3/uL (ref 4.0–10.5)
nRBC: 0 % (ref 0.0–0.2)

## 2022-06-07 LAB — URINALYSIS, COMPLETE (UACMP) WITH MICROSCOPIC
Bacteria, UA: NONE SEEN
Bilirubin Urine: NEGATIVE
Glucose, UA: NEGATIVE mg/dL
Hgb urine dipstick: NEGATIVE
Ketones, ur: NEGATIVE mg/dL
Nitrite: NEGATIVE
Protein, ur: NEGATIVE mg/dL
Specific Gravity, Urine: 1.017 (ref 1.005–1.030)
pH: 5 (ref 5.0–8.0)

## 2022-06-07 LAB — COMPREHENSIVE METABOLIC PANEL
ALT: 12 U/L (ref 0–44)
AST: 25 U/L (ref 15–41)
Albumin: 3.6 g/dL (ref 3.5–5.0)
Alkaline Phosphatase: 91 U/L (ref 38–126)
Anion gap: 8 (ref 5–15)
BUN: 6 mg/dL (ref 6–20)
CO2: 21 mmol/L — ABNORMAL LOW (ref 22–32)
Calcium: 8.3 mg/dL — ABNORMAL LOW (ref 8.9–10.3)
Chloride: 106 mmol/L (ref 98–111)
Creatinine, Ser: 0.37 mg/dL — ABNORMAL LOW (ref 0.44–1.00)
GFR, Estimated: >60 mL/min (ref >60)
Glucose, Bld: 84 mg/dL (ref 70–99)
Potassium: 3.6 mmol/L (ref 3.5–5.1)
Sodium: 135 mmol/L (ref 135–145)
Total Bilirubin: 0.5 mg/dL (ref 0.3–1.2)
Total Protein: 7.9 g/dL (ref 6.5–8.1)

## 2022-06-07 MED ORDER — NITROFURANTOIN MONOHYD MACRO 100 MG PO CAPS
100.0000 mg | ORAL_CAPSULE | Freq: Two times a day (BID) | ORAL | 0 refills | Status: AC
  Filled 2022-06-07: qty 14, 7d supply, fill #0

## 2022-06-07 MED ORDER — HEPARIN SOD (PORK) LOCK FLUSH 100 UNIT/ML IV SOLN
500.0000 [IU] | Freq: Once | INTRAVENOUS | Status: AC
  Administered 2022-06-07: 500 [IU] via INTRAVENOUS
  Filled 2022-06-07: qty 5

## 2022-06-07 MED ORDER — GABAPENTIN 600 MG PO TABS
600.0000 mg | ORAL_TABLET | Freq: Three times a day (TID) | ORAL | 3 refills | Status: DC
  Filled 2022-06-07: qty 90, 30d supply, fill #0

## 2022-06-07 MED ORDER — SODIUM CHLORIDE 0.9 % IV SOLN
Freq: Once | INTRAVENOUS | Status: AC
  Filled 2022-06-07: qty 250

## 2022-06-07 MED ORDER — ONDANSETRON HCL 8 MG PO TABS
8.0000 mg | ORAL_TABLET | Freq: Three times a day (TID) | ORAL | 2 refills | Status: DC | PRN
  Filled 2022-06-07: qty 45, 15d supply, fill #0

## 2022-06-07 MED ORDER — SODIUM CHLORIDE 0.9% FLUSH
10.0000 mL | Freq: Once | INTRAVENOUS | Status: AC
  Administered 2022-06-07: 10 mL via INTRAVENOUS
  Filled 2022-06-07: qty 10

## 2022-06-07 NOTE — Telephone Encounter (Signed)
Patient called via interpreter to ask to be seen today, She has severe vaginal burning pain that started Sunday at 7 PM and subsequent urinary incontinence. She has nausea and vomiting which her nausea medicine is not controlling, she states food just does not feel good on her stomach, she has been trying to eat cheese on tortilla and her usual diet. She reports that she has felt chilled, buit does not have a fever when she checks her temp. She vomited 4 times yesterday morning and a little last evening and once this morning so far. No one else in the house is sick. Please advise

## 2022-06-07 NOTE — Progress Notes (Signed)
Symptom Management [place] at [facility] [phone] [phone]  Patient Care Team: Sophia Guadeloupe, MD as PCP - General (Oncology) Sophia Junker, RN as Registered Nurse Sophia Demark, RN (Inactive) as Registered Nurse Sophia Guadeloupe, MD as Consulting Physician (Hematology and Oncology)   Name of the patient: Sophia [name]  CE:[id number]  Jul 23, 1980   Diagnosis: stage IV metastatic breast cancer ER/PR negative HER-2/neu positive with bone metastases    Current Treatment: Herceptin Tucatinib Xeloda   Oncological History: patient is a 9 year old Hispanic female who is here with her friend.  History obtained with the help of an interpreter.Patient self palpated left breast mass which was followed by a diagnostic bilateral mammogram.  Mammogram showed 3.1 x 2.9 x 1.9 cm hypoechoic mass at the 1 o'clock position of the left breast.  For abnormal cortically thickened left axillary lymph nodes measuring up to 5 mm.  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  Patient was also having ongoing back pain and was seen by [facility] orthopedics Dr. Doyle [name] who ordered MRI lumbar spine without contrast which showed possible pathologic fractures of L1 and L4 vertebral bodies with greater than 50% height loss at L1 and abnormal signal involving L2-L3 S1 as well as right iliac bone concerning for metastatic disease.  Patient is a single mother of 3 adult children and is very anxious today.  She reports significant back pain which radiates to her bilateral thighs.  Denies any focal tingling numbness or weakness.  Denies any bowel bladder incontinence.  Pain has been uncontrolled despite taking Tylenol.  No prior history of abnormal breast biopsies.  No family history of breast cancer   PET and MRI showed 3 areas of pathologic fracture of her  spine as well as widespread bony metastatic disease and concern for impending fracture of the right hip.  Given her worsening pain she was asked to come to the ER.  She has been evaluated by Dr. Rudene [name] from orthopedic surgery and underwent kyphoplasty at 3 different levels. T6 L1 and L4 along with radiofrequency ablation.    She also underwent prophylactic fixation of the right hip and not affected the sacral region.   Patient received first dose of Herceptin and Perjeta on 09/04/2019.  Patient initially received Taxol and Herceptin perjeta.  Presently patient is on maintenance Herceptin and Perjeta alone.  Patient found to have brain mets in November 2022 for which she received whole brain radiation treatment but there was no evidence of systemic progression.  She was continued on Herceptin and Perjeta.  Repeat MRI in June 2023 showed slight progression of brain mets.  Patient was switched to Herceptin tucatinib Xeloda  Date of visit: 06/07/22  Reason for Consult: Sophia [name] is a 42 y.o. female who presents today for:  Dysuria: She declines interpretor services today x 2. She tells me that she has had pain with urination for the past 4 days. She also has noticed mild hematuria, urinary urgency, urinary frequency, mild urinary incontinence, nausea and a few episodes of vomiting. She thought initially she may have a yeast infection but OTC monostat did not help and her symptoms only occur when she urinates. Vomiting has resolved but nausea is still present. She reports no abdominal pain, fever, back pain. Last official UTI was on 12/09/2021 which grew out E. Coli which  was resistant to everything except Imipenem, Nitrofurantoin and PIP/TAZO. Of note she was seen for UTI like symptoms on 05/10/2022 however her urine cultures did not grow out multiple bacterial species. Urine pregnancy test 5 days ago was negative.   Denies any neurologic complaints. Denies recent fevers or illnesses. Denies any easy  bleeding or bruising.  Denies chest pain. Denies constipation, or diarrhea. Patient offers no further specific complaints today.  Wt Readings from Last 3 Encounters:  06/07/22 125 lb (56.7 kg)  06/02/22 127 lb 9.6 oz (57.9 kg)  05/12/22 129 lb (58.5 kg)    PAST MEDICAL HISTORY: Past Medical History:  Diagnosis Date   Anxiety    Breast cancer ([place])    with mets   Cancer ([place])    Colitis    COVID-19 in immunocompromised patient ([place])    Family history of colon cancer    Vertigo     PAST SURGICAL HISTORY:  Past Surgical History:  Procedure Laterality Date   BREAST BIOPSY Left 08/14/2019   [place] bx of mass, coil marker, path pending   BREAST BIOPSY Left 08/14/2019   [place] bx of LN, hydromarker, path pending   BREAST BIOPSY Left 08/14/2019   affirm bx of calcs, x marker, path pending   ESOPHAGOGASTRODUODENOSCOPY (EGD) WITH PROPOFOL N/A 10/05/2019   Procedure: ESOPHAGOGASTRODUODENOSCOPY (EGD) WITH PROPOFOL;  Surgeon: Sophia Landsman, MD;  Location: [place];  Service: Gastroenterology;  Laterality: N/A;   FLEXIBLE SIGMOIDOSCOPY N/A 10/05/2019   Procedure: FLEXIBLE SIGMOIDOSCOPY;  Surgeon: Sophia Landsman, MD;  Location: [facility] ENDOSCOPY;  Service: Gastroenterology;  Laterality: N/A;   INTRAMEDULLARY (IM) NAIL INTERTROCHANTERIC Right 09/01/2019   Procedure: INTRAMEDULLARY (IM) NAIL INTERTROCHANTRIC AND RADIOFREQUENCY ABLATION;  Surgeon: Sophia Knows, MD;  Location: [facility] ORS;  Service: Orthopedics;  Laterality: Right;   KYPHOPLASTY N/A 08/29/2019   Procedure: KYPHOPLASTY T6, L1,L4 ,  RADIOFREQUENCY ABLATION;  Surgeon: Sophia Knows, MD;  Location: [facility] ORS;  Service: Orthopedics;  Laterality: N/A;   KYPHOPLASTY Right 09/01/2019   Procedure: Right Sacral Radiofrequency Ablation and Cement Augmentation, Right sacrum and iliac crest;  Surgeon: Sophia Knows, MD;  Location: [facility] ORS;  Service: Orthopedics;  Laterality: Right;   PORTA CATH INSERTION N/A 08/28/2019   Procedure: PORTA CATH  INSERTION;  Surgeon: Sophia Huxley, MD;  Location: [place] CV LAB;  Service: Cardiovascular;  Laterality: N/A;    HEMATOLOGY/ONCOLOGY HISTORY:  Oncology History  Primary cancer of left breast with metastasis to other site [facility])  08/28/2019 Initial Diagnosis   Primary cancer of left breast with metastasis to other site [facility])   01/21/2021 Cancer Staging   Staging form: Breast, AJCC 8th Edition - Clinical stage from 01/21/2021: Stage IV (cT2, cN1, cM1, G2, ER-, PR-, HER2+) - Signed by Sophia Guadeloupe, MD on 01/21/2021 Histologic grading system: 3 grade system   Primary malignant neoplasm of breast with metastasis ([place])  08/29/2019 Initial Diagnosis   Metastatic breast cancer ([place])   09/04/2019 - 10/31/2021 Chemotherapy   Patient is on Treatment Plan : BREAST Weekly Paclitaxel + Trastuzumab + Pertuzumab q21d      Genetic Testing   Negative genetic testing. No pathogenic variants identified on the [facility] CancerNext-Expanded+RNA panel. The report date is 04/13/2020.   The CancerNext-Expanded + RNAinsight gene panel offered by [employer] and includes sequencing and rearrangement analysis for the following 77 genes: IP, ALK, APC*, ATM*, AXIN2, BAP1, BARD1, BLM, BMPR1A, BRCA1*, BRCA2*, BRIP1*, CDC73, CDH1*,CDK4, CDKN1B, CDKN2A, CHEK2*, CTNNA1, DICER1, FANCC, FH, FLCN, GALNT12, KIF1B, LZTR1, MAX, MEN1,  MET, MLH1*, MSH2*, MSH3, MSH6*, MUTYH*, NBN, NF1*, NF2, NTHL1, PALB2*, PHOX2B, PMS2*, POT1, PRKAR1A, PTCH1, PTEN*, RAD51C*, RAD51D*,RB1, RECQL, RET, SDHA, SDHAF2, SDHB, SDHC, SDHD, SMAD4, SMARCA4, SMARCB1, SMARCE1, STK11, SUFU, TMEM127, TP53*,TSC1, TSC2, VHL and XRCC2 (sequencing and deletion/duplication); EGFR, EGLN1, HOXB13, KIT, MITF, PDGFRA, POLD1 and POLE (sequencing only); EPCAM and GREM1 (deletion/duplication only). DNA and RNA analyses performed for * genes.   11/18/2021 - 11/18/2021 Chemotherapy   Patient is on Treatment Plan : BREAST Trastuzumab (8/6) IV D1 + Capecitabine +  Tucatinib q21d      11/18/2021 - 12/09/2021 Chemotherapy   Patient is on Treatment Plan : BREAST Trastuzumab (8/6) IV D1 + Capecitabine +  Tucatinib q21d     12/26/2021 -  Chemotherapy   Patient is on Treatment Plan : BREAST Trastuzumab (8/6) IV D1 + Capecitabine +  Tucatinib q21d       ALLERGIES:  has No Known Allergies.  MEDICATIONS:  Current Outpatient Medications  Medication Sig Dispense Refill   albuterol (VENTOLIN HFA) 108 (90 Base) MCG/ACT inhaler Inhale 2 puffs into the lungs every 6 (six) hours as needed for wheezing or shortness of breath. 8.5 g 2   benzoyl peroxide-erythromycin (BENZAMYCIN) gel Apply topically 2 (two) times daily. 46.6 g 0   butalbital-acetaminophen-caffeine (FIORICET) 50-325-40 MG tablet Take 1 tablet by mouth every 8 (eight) hours as needed for headache. 30 tablet 1   Calcium Carb-Cholecalciferol 600-10 MG-MCG TABS Take 1 tablet by mouth 2 (two) times daily. 180 tablet 0   capecitabine (XELODA) 500 MG tablet Take 3 tablets (1,500 mg total) by mouth 2 (two) times daily after a meal. Take for 14 days, then hold for 7 days. Repeat every 21 days. 84 tablet 2   diphenoxylate-atropine (LOMOTIL) 2.5-0.025 MG tablet Take 1 tablet by mouth 4 (four) times daily as needed for diarrhea or loose stools. 40 tablet 0   DULoxetine (CYMBALTA) 60 MG capsule Take 1 capsule (60 mg total) by mouth daily. 30 capsule 3   gabapentin (NEURONTIN) 600 MG tablet Take 1 tablet (600 mg total) by mouth 3 (three) times daily. 90 tablet 3   HYDROmorphone (DILAUDID) 2 MG tablet Take 0.5-1 tablets (1-2 mg total) by mouth every 4 (four) hours as needed for severe pain. 60 tablet 0   lidocaine-prilocaine (EMLA) cream Apply 1 Application topically as needed. Apply small amount to port site at least 1 hour prior to it being accessed, cover with plastic wrap 30 g 3   LORazepam (ATIVAN) 0.5 MG tablet Take 1 tablet (0.5 mg total) by mouth every 6 (six) hours as needed for anxiety (and nausea). AS NEEDED FOR NAUSEA 60 tablet 0    morphine (MS CONTIN) 30 MG 12 hr tablet Take 1 tablet (30 mg total) by mouth every 12 (twelve) hours. 60 tablet 0   naloxone (NARCAN) nasal spray 4 mg/0.1 mL Administer one spray in nostril for opioid overdose. If unresponsive after 1 dose, may repeat a dose every 2-3 minutes in alternating nostrils until emergency services arrive. 2 each 3   nitrofurantoin, macrocrystal-monohydrate, (MACROBID) 100 MG capsule Take 1 capsule (100 mg total) by mouth 2 (two) times daily for 7 days. 14 capsule 0   OLANZapine (ZYPREXA) 10 MG tablet Take 1 tablet (10 mg total) by mouth at bedtime as needed (nausea). 30 tablet 2   omeprazole (PRILOSEC) 20 MG capsule Take 1 capsule (20 mg total) by mouth daily. 30 capsule 3   ondansetron (ZOFRAN) 8 MG tablet Take 1 tablet (8 mg total) by  mouth every 8 (eight) hours as needed for nausea or vomiting. 45 tablet 2   polyethylene glycol powder (MIRALAX) 17 GM/SCOOP powder Take 17 g by mouth daily as needed. 238 g 3   tucatinib (TUKYSA) 150 MG tablet Take 2 tablets (300 mg total) by mouth 2 (two) times daily. 120 tablet 3   No current facility-administered medications for this visit.   Facility-Administered Medications Ordered in Other Visits  Medication Dose Route Frequency Provider Last Rate Last Admin   0.9 %  sodium chloride infusion   Intravenous Continuous Jacquelin Hawking, NP   Stopped at 06/02/20 1453   0.9 %  sodium chloride infusion   Intravenous Once Hughie Closs, [place] 999 mL/hr at 06/07/22 1404 New Bag at 06/07/22 1404   heparin lock flush 100 unit/mL  500 Units Intravenous Once Sophia Guadeloupe, MD       heparin lock flush 100 unit/mL  500 Units Intravenous Once Sophia Guadeloupe, MD       heparin lock flush 100 unit/mL  500 Units Intravenous Once Hymie Gorr M, PA-C       sodium chloride flush (NS) 0.9 % injection 10 mL  10 mL Intravenous Once Sophia Guadeloupe, MD       sodium chloride flush (NS) 0.9 % injection 10 mL  10 mL Intravenous Once Sophia Guadeloupe, MD        VITAL SIGNS: BP 104/74 (BP Location: Left Arm, Patient Position: Sitting, Cuff Size: Normal)   Pulse 71   Temp 97.9 F (36.6 C)   Resp 16   Ht 5\' 2"  (1.575 m)   Wt 125 lb (56.7 kg)   SpO2 98%   BMI 22.86 kg/m  Filed Weights   06/07/22 1332  Weight: 125 lb (56.7 kg)    Estimated body mass index is 22.86 kg/m as calculated from the following:   Height as of this encounter: 5\' 2"  (1.575 m).   Weight as of this encounter: 125 lb (56.7 kg).  LABS: CBC:    Component Value Date/Time   WBC 4.3 06/07/2022 1306   HGB 11.7 (L) 06/07/2022 1306   HCT 35.5 (L) 06/07/2022 1306   PLT 429 (H) 06/07/2022 1306   MCV 87.4 06/07/2022 1306   NEUTROABS 2.6 06/07/2022 1306   LYMPHSABS 1.2 06/07/2022 1306   MONOABS 0.4 06/07/2022 1306   EOSABS 0.1 06/07/2022 1306   BASOSABS 0.0 06/07/2022 1306   Comprehensive Metabolic Panel:    Component Value Date/Time   NA 135 06/07/2022 1306   K 3.6 06/07/2022 1306   CL 106 06/07/2022 1306   CO2 21 (L) 06/07/2022 1306   BUN 6 06/07/2022 1306   CREATININE 0.37 (L) 06/07/2022 1306   GLUCOSE 84 06/07/2022 1306   CALCIUM 8.3 (L) 06/07/2022 1306   AST 25 06/07/2022 1306   ALT 12 06/07/2022 1306   ALKPHOS 91 06/07/2022 1306   BILITOT 0.5 06/07/2022 1306   PROT 7.9 06/07/2022 1306   ALBUMIN 3.6 06/07/2022 1306    RADIOGRAPHIC STUDIES: No results found.  PERFORMANCE STATUS (ECOG) : 2 - Symptomatic, <50% confined to bed  Review of Systems Unless otherwise noted, a complete review of systems is negative.  Physical Exam General: NAD. Non-toxic appearing  Cardiovascular: regular rate and rhythm Pulmonary: clear ant fields Abdomen: soft, nontender, + bowel sounds, no CVA tenderness GU: no suprapubic tenderness Skin: no rashes Neurological: Weakness but otherwise nonfocal  Assessment and Plan- Patient is a 42 y.o. female  Encounter Diagnoses  Name Primary?   Dysuria Yes   Frequent UTI    Resistance to antibiotic     New.  History and UA suggestive of UTI. Vitals and labs not suggestive of sepsis ir pyelonephritis. Given her former extensive resistance to most outpatient antibiotics expect macrobid I am going to send this to the pharmacy for her. She request IVF today- 500 ml to be administered as she reports that this normally help to make her feel better. Patient verbalized understanding our plan and was able to repeat it back to me correctly. Discussed red flag signs and symptoms. Discussed ER precautions. Referral to Urology submitted given her frequent UTIs with extensive ABX resistance.    Patient expressed understanding and was in agreement with this plan. She also understands that She can call clinic at any time with any questions, concerns, or complaints.   Thank you for allowing me to participate in the care of this very pleasant patient.   Time Total: 25  Visit consisted of counseling and education dealing with the complex and emotionally intense issues of symptom management in the setting of serious illness.Greater than 50%  of this time was spent counseling and coordinating care related to the above assessment and plan.  Signed by: Nelwyn Salisbury, PA-C

## 2022-06-07 NOTE — Telephone Encounter (Signed)
I called the pt. She said that she had vomiting  mon. & tues and having painful urination. She was crying on the phone.  She is coming in today 12:45 for labs and 1 pm to see [facility].I told her that we do not have room on van to use and she states that she can drive now . I told her to be sure to put the emla cream over the port. She is agreeable to this

## 2022-06-07 NOTE — Telephone Encounter (Signed)
[facility]c today if possible for fluids and uti work up

## 2022-06-07 NOTE — Addendum Note (Signed)
Addended by: Nelwyn Salisbury on: 06/07/2022 02:41 PM   Modules accepted: Orders

## 2022-06-07 NOTE — Progress Notes (Signed)
Having dysuria, frequency and noted blood in urine one time yesterday. Having itching at night.

## 2022-06-08 LAB — URINE CULTURE: Culture: NO GROWTH

## 2022-06-12 ENCOUNTER — Telehealth: Payer: Self-pay | Admitting: Pharmacist

## 2022-06-12 ENCOUNTER — Emergency Department
Admission: EM | Admit: 2022-06-12 | Discharge: 2022-06-12 | Disposition: A | Discharge status: Home / Self Care | Payer: Self-pay | Attending: Emergency Medicine | Admitting: Emergency Medicine

## 2022-06-12 ENCOUNTER — Other Ambulatory Visit: Payer: Self-pay

## 2022-06-12 ENCOUNTER — Encounter: Payer: Self-pay | Admitting: Oncology

## 2022-06-12 ENCOUNTER — Emergency Department: Payer: Self-pay

## 2022-06-12 DIAGNOSIS — Z853 Personal history of malignant neoplasm of breast: Secondary | ICD-10-CM | POA: Insufficient documentation

## 2022-06-12 DIAGNOSIS — Y92410 Unspecified street and highway as the place of occurrence of the external cause: Secondary | ICD-10-CM | POA: Insufficient documentation

## 2022-06-12 DIAGNOSIS — S161XXA Strain of muscle, fascia and tendon at neck level, initial encounter: Secondary | ICD-10-CM | POA: Insufficient documentation

## 2022-06-12 DIAGNOSIS — G44309 Post-traumatic headache, unspecified, not intractable: Secondary | ICD-10-CM | POA: Diagnosis not present

## 2022-06-12 DIAGNOSIS — Z85830 Personal history of malignant neoplasm of bone: Secondary | ICD-10-CM | POA: Insufficient documentation

## 2022-06-12 DIAGNOSIS — S199XXA Unspecified injury of neck, initial encounter: Secondary | ICD-10-CM | POA: Diagnosis present

## 2022-06-12 MED ORDER — CYCLOBENZAPRINE HCL 10 MG PO TABS
10.0000 mg | ORAL_TABLET | Freq: Three times a day (TID) | ORAL | 0 refills | Status: DC | PRN
  Filled 2022-06-12: qty 30, 10d supply, fill #0

## 2022-06-12 MED ORDER — HYDROMORPHONE HCL 2 MG PO TABS
1.0000 mg | ORAL_TABLET | Freq: Once | ORAL | Status: AC
  Administered 2022-06-12: 1 mg via ORAL
  Filled 2022-06-12: qty 1
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
## 2022-06-12 NOTE — Telephone Encounter (Signed)
[facility] to set-up delivery of patient tucatinib. Expected delivery date on 06/20/22. Medication will be delivered to the cancer center to be given to patient due to housing issues.

## 2022-06-12 NOTE — Discharge Instructions (Signed)
Take your pain medication as prescribed.  Follow up with primary care for symptoms not improving over the week.  If symptoms change or worsen, return to the ER if unable to schedule an appointment.
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
## 2022-06-15 ENCOUNTER — Inpatient Hospital Stay: Payer: Self-pay | Admitting: Nurse Practitioner

## 2022-06-16 ENCOUNTER — Inpatient Hospital Stay: Payer: Self-pay

## 2022-06-16 ENCOUNTER — Encounter: Payer: Self-pay | Admitting: Nurse Practitioner

## 2022-06-16 ENCOUNTER — Inpatient Hospital Stay (HOSPITAL_BASED_OUTPATIENT_CLINIC_OR_DEPARTMENT_OTHER): Payer: Self-pay | Admitting: Nurse Practitioner

## 2022-06-16 VITALS — BP 104/72 | HR 65 | Temp 98.6°F | Wt 124.0 lb

## 2022-06-16 DIAGNOSIS — Z95828 Presence of other vascular implants and grafts: Secondary | ICD-10-CM

## 2022-06-16 DIAGNOSIS — E860 Dehydration: Secondary | ICD-10-CM

## 2022-06-16 DIAGNOSIS — Z09 Encounter for follow-up examination after completed treatment for conditions other than malignant neoplasm: Secondary | ICD-10-CM

## 2022-06-16 DIAGNOSIS — R519 Headache, unspecified: Secondary | ICD-10-CM

## 2022-06-16 DIAGNOSIS — C50919 Malignant neoplasm of unspecified site of unspecified female breast: Secondary | ICD-10-CM

## 2022-06-16 DIAGNOSIS — Z5112 Encounter for antineoplastic immunotherapy: Secondary | ICD-10-CM | POA: Diagnosis not present

## 2022-06-16 DIAGNOSIS — C7931 Secondary malignant neoplasm of brain: Secondary | ICD-10-CM

## 2022-06-16 MED ORDER — SODIUM CHLORIDE 0.9 % IV SOLN
INTRAVENOUS | Status: DC
  Filled 2022-06-16 (×2): qty 250

## 2022-06-16 MED ORDER — SODIUM CHLORIDE 0.9% FLUSH
10.0000 mL | Freq: Once | INTRAVENOUS | Status: AC
  Administered 2022-06-16: 10 mL via INTRAVENOUS
  Filled 2022-06-16: qty 10

## 2022-06-16 MED ORDER — HEPARIN SOD (PORK) LOCK FLUSH 100 UNIT/ML IV SOLN
500.0000 [IU] | Freq: Once | INTRAVENOUS | Status: AC
  Administered 2022-06-16: 500 [IU]
  Filled 2022-06-16: qty 5

## 2022-06-16 MED ORDER — OXYCODONE HCL 5 MG PO TABS
5.0000 mg | ORAL_TABLET | Freq: Once | ORAL | Status: AC
  Administered 2022-06-16: 5 mg via ORAL
  Filled 2022-06-16: qty 1
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
## 2022-06-22 ENCOUNTER — Encounter: Payer: Self-pay | Admitting: Oncology

## 2022-06-23 ENCOUNTER — Other Ambulatory Visit: Payer: Self-pay

## 2022-06-23 ENCOUNTER — Inpatient Hospital Stay: Payer: Self-pay | Attending: Oncology

## 2022-06-23 ENCOUNTER — Inpatient Hospital Stay (HOSPITAL_BASED_OUTPATIENT_CLINIC_OR_DEPARTMENT_OTHER): Payer: Self-pay | Admitting: Oncology

## 2022-06-23 ENCOUNTER — Inpatient Hospital Stay: Payer: Self-pay

## 2022-06-23 ENCOUNTER — Encounter: Payer: Self-pay | Admitting: Oncology

## 2022-06-23 ENCOUNTER — Other Ambulatory Visit (HOSPITAL_COMMUNITY): Payer: Self-pay

## 2022-06-23 VITALS — BP 94/66 | HR 77 | Temp 98.4°F | Resp 16 | Wt 124.7 lb

## 2022-06-23 DIAGNOSIS — R300 Dysuria: Secondary | ICD-10-CM

## 2022-06-23 DIAGNOSIS — Z5112 Encounter for antineoplastic immunotherapy: Secondary | ICD-10-CM

## 2022-06-23 DIAGNOSIS — Z800 Family history of malignant neoplasm of digestive organs: Secondary | ICD-10-CM | POA: Insufficient documentation

## 2022-06-23 DIAGNOSIS — C7931 Secondary malignant neoplasm of brain: Secondary | ICD-10-CM | POA: Insufficient documentation

## 2022-06-23 DIAGNOSIS — M8440XA Pathological fracture, unspecified site, initial encounter for fracture: Secondary | ICD-10-CM

## 2022-06-23 DIAGNOSIS — C50919 Malignant neoplasm of unspecified site of unspecified female breast: Secondary | ICD-10-CM

## 2022-06-23 DIAGNOSIS — Z171 Estrogen receptor negative status [ER-]: Secondary | ICD-10-CM | POA: Insufficient documentation

## 2022-06-23 DIAGNOSIS — C7951 Secondary malignant neoplasm of bone: Secondary | ICD-10-CM | POA: Insufficient documentation

## 2022-06-23 DIAGNOSIS — G893 Neoplasm related pain (acute) (chronic): Secondary | ICD-10-CM

## 2022-06-23 DIAGNOSIS — F419 Anxiety disorder, unspecified: Secondary | ICD-10-CM | POA: Insufficient documentation

## 2022-06-23 DIAGNOSIS — E876 Hypokalemia: Secondary | ICD-10-CM

## 2022-06-23 DIAGNOSIS — Z79899 Other long term (current) drug therapy: Secondary | ICD-10-CM

## 2022-06-23 DIAGNOSIS — C50412 Malignant neoplasm of upper-outer quadrant of left female breast: Secondary | ICD-10-CM | POA: Insufficient documentation

## 2022-06-23 LAB — COMPREHENSIVE METABOLIC PANEL
ALT: 10 U/L (ref 0–44)
AST: 23 U/L (ref 15–41)
Albumin: 3.5 g/dL (ref 3.5–5.0)
Alkaline Phosphatase: 76 U/L (ref 38–126)
Anion gap: 4 — ABNORMAL LOW (ref 5–15)
BUN: 7 mg/dL (ref 6–20)
CO2: 25 mmol/L (ref 22–32)
Calcium: 8.3 mg/dL — ABNORMAL LOW (ref 8.9–10.3)
Chloride: 106 mmol/L (ref 98–111)
Creatinine, Ser: 0.51 mg/dL (ref 0.44–1.00)
GFR, Estimated: >60 mL/min (ref >60)
Glucose, Bld: 102 mg/dL — ABNORMAL HIGH (ref 70–99)
Potassium: 3.3 mmol/L — ABNORMAL LOW (ref 3.5–5.1)
Sodium: 135 mmol/L (ref 135–145)
Total Bilirubin: 0.4 mg/dL (ref 0.3–1.2)
Total Protein: 7.6 g/dL (ref 6.5–8.1)

## 2022-06-23 LAB — URINALYSIS, COMPLETE (UACMP) WITH MICROSCOPIC
Bilirubin Urine: NEGATIVE
Glucose, UA: NEGATIVE mg/dL
Hgb urine dipstick: NEGATIVE
Ketones, ur: NEGATIVE mg/dL
Nitrite: NEGATIVE
Protein, ur: NEGATIVE mg/dL
Specific Gravity, Urine: 1.003 — ABNORMAL LOW (ref 1.005–1.030)
pH: 6 (ref 5.0–8.0)

## 2022-06-23 LAB — CBC WITH DIFFERENTIAL/PLATELET
Abs Immature Granulocytes: 0.01 10*3/uL (ref 0.00–0.07)
Basophils Absolute: 0 10*3/uL (ref 0.0–0.1)
Basophils Relative: 1 %
Eosinophils Absolute: 0.1 10*3/uL (ref 0.0–0.5)
Eosinophils Relative: 4 %
HCT: 37.4 % (ref 36.0–46.0)
Hemoglobin: 12.4 g/dL (ref 12.0–15.0)
Immature Granulocytes: 0 %
Lymphocytes Relative: 31 %
Lymphs Abs: 1.2 10*3/uL (ref 0.7–4.0)
MCH: 28.8 pg (ref 26.0–34.0)
MCHC: 33.2 g/dL (ref 30.0–36.0)
MCV: 86.8 fL (ref 80.0–100.0)
Monocytes Absolute: 0.3 10*3/uL (ref 0.1–1.0)
Monocytes Relative: 8 %
Neutro Abs: 2.1 10*3/uL (ref 1.7–7.7)
Neutrophils Relative %: 56 %
Platelets: 319 10*3/uL (ref 150–400)
RBC: 4.31 MIL/uL (ref 3.87–5.11)
RDW: 13.2 % (ref 11.5–15.5)
WBC: 3.7 10*3/uL — ABNORMAL LOW (ref 4.0–10.5)
nRBC: 0 % (ref 0.0–0.2)

## 2022-06-23 LAB — PREGNANCY, URINE: Preg Test, Ur: NEGATIVE

## 2022-06-23 MED ORDER — HEPARIN SOD (PORK) LOCK FLUSH 100 UNIT/ML IV SOLN
500.0000 [IU] | Freq: Once | INTRAVENOUS | Status: AC | PRN
  Administered 2022-06-23: 500 [IU]
  Filled 2022-06-23: qty 5

## 2022-06-23 MED ORDER — POTASSIUM CHLORIDE IN NACL 20-0.9 MEQ/L-% IV SOLN
Freq: Once | INTRAVENOUS | Status: AC
  Filled 2022-06-23: qty 1000

## 2022-06-23 MED ORDER — DIPHENHYDRAMINE HCL 50 MG/ML IJ SOLN
25.0000 mg | Freq: Once | INTRAMUSCULAR | Status: AC
  Administered 2022-06-23: 25 mg via INTRAVENOUS
  Filled 2022-06-23: qty 1

## 2022-06-23 MED ORDER — TRASTUZUMAB-ANNS CHEMO 150 MG IV SOLR: 6.0000 mg/kg | Freq: Once | INTRAVENOUS | Status: DC

## 2022-06-23 MED ORDER — TRASTUZUMAB-ANNS CHEMO 150 MG IV SOLR
6.0000 mg/kg | Freq: Once | INTRAVENOUS | Status: AC
  Administered 2022-06-23: 336 mg via INTRAVENOUS
  Filled 2022-06-23: qty 16

## 2022-06-23 MED ORDER — ACETAMINOPHEN 325 MG PO TABS
650.0000 mg | ORAL_TABLET | Freq: Once | ORAL | Status: AC
  Administered 2022-06-23: 650 mg via ORAL
  Filled 2022-06-23: qty 2

## 2022-06-23 MED ORDER — SODIUM CHLORIDE 0.9 % IV SOLN
Freq: Once | INTRAVENOUS | Status: AC
  Filled 2022-06-23: qty 250

## 2022-06-23 NOTE — Patient Instructions (Signed)
Instrucciones al darle de alta: Discharge Instructions Gracias por elegir al [facility] de Cncer de [place] para brindarle atencin mdica de oncologa y Teacher, English as a foreign language.   Si usted tiene una cita de laboratorio con [employer] de [place], por favor vaya directamente al [employer] de Cncer y regstrese en el rea de Engineer, maintenance (IT).   Use ropa cmoda y [place] para tener fcil acceso a las vas del Portacath (acceso venoso de Set designer duracin) o la lnea PICC (catter central colocado por va perifrica).   Nos esforzamos por ofrecerle tiempo de calidad con su proveedor. Es posible que tenga que volver a programar su cita si llega tarde (15 minutos o ms).  El llegar tarde le afecta a usted y a otros pacientes cuyas citas son posteriores a Armed forces operational officer.  Adems, si usted falta a tres o ms citas sin avisar a la oficina, puede ser retirado(a) de la clnica a discrecin del proveedor.      Para las solicitudes de renovacin de recetas, pida a su farmacia que se ponga en contacto con nuestra oficina y deje que transcurran 72 horas para que se complete el proceso de las renovaciones.    Hoy usted recibi los siguientes agentes de quimioterapia e/o inmunoterapia Transtuzumab-anns.      Para ayudar a prevenir las nuseas y los vmitos despus de su tratamiento, le recomendamos que tome su medicamento para las nuseas segn las indicaciones.  LOS SNTOMAS QUE DEBEN COMUNICARSE INMEDIATAMENTE SE INDICAN A CONTINUACIN: *FIEBRE SUPERIOR A 100.4 F (38 C) O MS *ESCALOFROS O SUDORACIN *NUSEAS Y VMITOS QUE NO SE CONTROLAN CON EL MEDICAMENTO PARA LAS NUSEAS *DIFICULTAD INUSUAL PARA RESPIRAR  *MORETONES O HEMORRAGIAS NO HABITUALES *PROBLEMAS URINARIOS (dolor o ardor al Geographical information systems officer o frecuencia para Geographical information systems officer) *PROBLEMAS INTESTINALES (diarrea inusual, estreimiento, dolor cerca del ano) SENSIBILIDAD EN LA BOCA Y EN LA GARGANTA CON O SIN LA PRESENCIA DE LCERAS (dolor de garganta, llagas en la boca o dolor de  muelas/dientes) ERUPCIN, HINCHAZN O DOLORES INUSUALES FLUJO VAGINAL INUSUAL O PICAZN/RASQUIA    Los puntos marcados con un asterisco ( *) indican una posible emergencia y debe hacer un seguimiento tan pronto como le sea posible o vaya al Departamento de Emergencias si se le presenta algn problema.  Por favor, muestre la [place] DE ADVERTENCIA DE Marc Morgans DE ADVERTENCIA DE [place] Fanti al registrarse en [address] de Emergencias y a la enfermera de triaje.  Si tiene preguntas despus de su visita o necesita cancelar o volver a programar su cita, por favor pngase en contacto con [place] [facility]  [phone]  y siga las instrucciones. Las horas de oficina son de 8:00 a.m. a 4:30 p.m. de lunes a viernes. Por favor, tenga en cuenta que los mensajes de voz que se dejan despus de las 4:00 p.m. posiblemente no se devolvern hasta el siguiente da de [place].  Cerramos los fines de semana y [employer]. En todo momento tiene acceso a una enfermera para preguntas urgentes. Por favor, llame al nmero principal de la clnica  [phone] y siga las instrucciones.   Para cualquier pregunta que no sea de carcter urgente, tambin puede ponerse en contacto con su proveedor [employer]. Ahora ofrecemos visitas electrnicas para cualquier persona mayor de 18 aos que solicite atencin mdica en lnea para los sntomas que no sean urgentes. Para ms detalles vaya a [email or web address].   Tambin puede bajar la aplicacin de MyChart! Vaya a la tienda de aplicaciones, busque "MyChart", abra la aplicacin,  seleccione [place], e ingrese con su nombre de usuario y la contrasea de MyChart.   
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
## 2022-06-25 LAB — URINE CULTURE: Culture: 40000 — AB

## 2022-06-26 ENCOUNTER — Other Ambulatory Visit: Payer: Self-pay

## 2022-06-26 ENCOUNTER — Other Ambulatory Visit: Payer: Self-pay | Admitting: Oncology

## 2022-06-26 ENCOUNTER — Other Ambulatory Visit: Payer: Self-pay | Admitting: *Deleted

## 2022-06-26 ENCOUNTER — Encounter: Payer: Self-pay | Admitting: Oncology

## 2022-06-26 DIAGNOSIS — C50919 Malignant neoplasm of unspecified site of unspecified female breast: Secondary | ICD-10-CM

## 2022-06-26 MED ORDER — NITROFURANTOIN MONOHYD MACRO 100 MG PO CAPS
100.0000 mg | ORAL_CAPSULE | Freq: Two times a day (BID) | ORAL | 0 refills | Status: DC
  Filled 2022-06-26: qty 14, 7d supply, fill #0

## 2022-06-26 NOTE — Telephone Encounter (Signed)
CBC with Differential/Platelet Order: [id number] Status: Final result     Visible to patient: No (inaccessible in MyChart)     Next appt: 07/04/2022 at 10:00 AM in Radiology ([facility])     Dx: Multiple pathological fractures, init...   0 Result Notes          Component Ref Range & Units 3 d ago 2 wk ago 3 wk ago 1 mo ago 2 mo ago 3 mo ago 4 mo ago  WBC 4.0 - 10.5 K/uL 3.7 Low  4.3 3.7 Low  6.0 5.4 14.5 High  4.4  RBC 3.87 - 5.11 MIL/uL 4.31 4.06 3.95 3.84 Low  4.06 4.22 4.20  Hemoglobin 12.0 - 15.0 g/dL 96.2 22.9 Low  79.8 Low  11.6 Low  13.0 13.3 13.0  HCT 36.0 - 46.0 % 37.4 35.5 Low  34.8 Low  35.1 Low  36.9 38.7 38.2  MCV 80.0 - 100.0 fL 86.8 87.4 88.1 91.4 90.9 91.7 91.0  MCH 26.0 - 34.0 pg 28.8 28.8 29.6 30.2 32.0 31.5 31.0  MCHC 30.0 - 36.0 g/dL 92.1 19.4 17.4 08.1 44.8 34.4 34.0  RDW 11.5 - 15.5 % 13.2 12.8 12.4 12.5 13.7 17.6 High  14.7  Platelets 150 - 400 K/uL 319 429 High  416 High  521 High  350 159 297  nRBC 0.0 - 0.2 % 0.0 0.0 0.0 0.0 0.0 0.0 0.0  Neutrophils Relative % % 56 59 57 78 60 87 60  Neutro Abs 1.7 - 7.7 K/uL 2.1 2.6 2.2 4.7 3.3 12.7 High  2.6  Lymphocytes Relative % 31 28 29 14 27 5 27   Lymphs Abs 0.7 - 4.0 K/uL 1.2 1.2 1.1 0.8 1.5 0.7 1.2  Monocytes Relative % 8 9 9 6 8 5 11   Monocytes Absolute 0.1 - 1.0 K/uL 0.3 0.4 0.3 0.3 0.4 0.7 0.5  Eosinophils Relative % 4 2 3 2 4 2 1   Eosinophils Absolute 0.0 - 0.5 K/uL 0.1 0.1 0.1 0.1 0.2 0.2 0.1  Basophils Relative % 1 1 1  0 1 0 1  Basophils Absolute 0.0 - 0.1 K/uL 0.0 0.0 0.0 0.0 0.0 0.0 0.0  Immature Granulocytes % 0 1 1 0 0 1 0  Abs Immature Granulocytes 0.00 - 0.07 K/uL 0.01 0.06 CM 0.02 CM 0.02 CM 0.02 CM 0.19 High  CM 0.01 CM  Comment: Performed at [facility], [address] [place], [place] [address]  Resulting Agency [facility]         Specimen Collected: 06/23/22 09:57 Last Resulted: 06/23/22  10:03      Lab Flowsheet      Order Details      View Encounter      Lab and Collection Details      Routing      Result History    View All Conversations on this Encounter      CM=Additional comments      Result Care Coordination   Patient Communication   Add Comments   Not seen Back to Top      Other Results from 06/23/2022  Pregnancy, urine Order: [id number] Status: Final result      Visible to patient: No (inaccessible in MyChart)      Next appt: 07/04/2022 at 10:00 AM in Radiology ([facility])      Dx: Primary malignant neoplasm of breast ...    0 Result Notes  Component Ref Range & Units 3 d ago (06/23/22) 3 wk ago (06/02/22) 1 mo ago (05/12/22) 2 yr ago (10/01/19) 2 yr ago (08/28/19) 7 yr ago (01/04/15)  Preg Test, Ur NEGATIVE NEGATIVE NEGATIVE CM NEGATIVE CM NEGATIVE CM NEGATIVE CM NEGATIVE CM  Comment:        THE SENSITIVITY OF THIS METHODOLOGY IS >20 mIU/mL. Performed at [facility], [address] [place], [place] [address]  Resulting Agency [facility]         Specimen Collected: 06/23/22 12:35 Last Resulted: 06/23/22 12:51      Lab Flowsheet       Order Details       View Encounter       Lab and Collection Details       Routing       Result History     View All Conversations on this Encounter      CM=Additional comments      Result Care Coordination   Patient Communication   Add Comments   Not seen Back to Top         Contains abnormal data Comprehensive metabolic panel Order: [id number] Status: Final result      Visible to patient: No (inaccessible in MyChart)      Next appt: 07/04/2022 at 10:00 AM in Radiology ([facility])      Dx: Multiple pathological fractures, init...    0 Result Notes            Component Ref Range & Units 3 d ago 2 wk ago 3 wk ago 1 mo ago 2 mo ago 3 mo ago 4 mo ago  Sodium 135 - 145 mmol/L 135 135 137  135 139 136 139  Potassium 3.5 - 5.1 mmol/L 3.3 Low  3.6 3.6 3.5 3.0 Low  3.6 3.4 Low   Chloride 98 - 111 mmol/L 106 106 106 104 109 103 108  CO2 22 - 32 mmol/L 25 21 Low  24 22 22 25 24   Glucose, Bld 70 - 99 mg/dL 616 High  84 CM 90 CM 073 High  CM 86 CM 87 CM 107 High  CM  Comment: Glucose reference range applies only to samples taken after fasting for at least 8 hours.  BUN 6 - 20 mg/dL 7 6 <5 Low  6 10 8 8   Creatinine, Ser 0.44 - 1.00 mg/dL 7.10 6.26 Low  9.48 5.46 0.57 0.56 0.58  Calcium 8.9 - 10.3 mg/dL 8.3 Low  8.3 Low  8.2 Low  7.6 Low  8.7 Low  7.9 Low  9.6  Total Protein 6.5 - 8.1 g/dL 7.6 7.9 7.8 7.5 6.9 6.1 Low  7.8  Albumin 3.5 - 5.0 g/dL 3.5 3.6 3.3 Low  3.2 Low  3.9 3.4 Low  4.4  AST 15 - 41 U/L 23 25 22 25 26 31  34  ALT 0 - 44 U/L 10 12 11 13 16 22 31   Alkaline Phosphatase 38 - 126 U/L 76 91 89 112 74 64 67  Total Bilirubin 0.3 - 1.2 mg/dL 0.4 0.5 0.5 0.4 0.5 1.0 0.5  GFR, Estimated >60 mL/min >60 >60 CM >60 CM >60 CM >60 CM >60 CM >60 CM  Comment: (NOTE) Calculated using the CKD-EPI Creatinine Equation (2021)  Anion gap 5 - 15 4 Low  8 CM 7 CM 9 CM 8 CM 8 CM 7 CM  Comment: Performed at [facility], [address] [place], [place] [id number]  [facility]         Specimen Collected: 06/23/22 09:57 Last Resulted: 06/23/22 10:34

## 2022-06-27 ENCOUNTER — Inpatient Hospital Stay: Payer: Self-pay

## 2022-06-27 ENCOUNTER — Encounter: Payer: Self-pay | Admitting: Nurse Practitioner

## 2022-06-27 ENCOUNTER — Other Ambulatory Visit: Payer: Self-pay

## 2022-06-27 ENCOUNTER — Other Ambulatory Visit (HOSPITAL_COMMUNITY): Payer: Self-pay

## 2022-06-27 ENCOUNTER — Encounter: Payer: Self-pay | Admitting: Oncology

## 2022-06-27 ENCOUNTER — Other Ambulatory Visit: Payer: Self-pay | Admitting: *Deleted

## 2022-06-27 ENCOUNTER — Inpatient Hospital Stay (HOSPITAL_BASED_OUTPATIENT_CLINIC_OR_DEPARTMENT_OTHER): Payer: Self-pay | Admitting: Nurse Practitioner

## 2022-06-27 ENCOUNTER — Telehealth: Payer: Self-pay | Admitting: *Deleted

## 2022-06-27 VITALS — BP 116/85 | HR 59 | Temp 98.0°F | Resp 18

## 2022-06-27 DIAGNOSIS — J301 Allergic rhinitis due to pollen: Secondary | ICD-10-CM

## 2022-06-27 DIAGNOSIS — G893 Neoplasm related pain (acute) (chronic): Secondary | ICD-10-CM

## 2022-06-27 DIAGNOSIS — F411 Generalized anxiety disorder: Secondary | ICD-10-CM

## 2022-06-27 DIAGNOSIS — C50919 Malignant neoplasm of unspecified site of unspecified female breast: Secondary | ICD-10-CM

## 2022-06-27 DIAGNOSIS — Z95828 Presence of other vascular implants and grafts: Secondary | ICD-10-CM

## 2022-06-27 DIAGNOSIS — C801 Malignant (primary) neoplasm, unspecified: Secondary | ICD-10-CM

## 2022-06-27 LAB — CBC WITH DIFFERENTIAL (CANCER CENTER ONLY)
Abs Immature Granulocytes: 0 10*3/uL (ref 0.00–0.07)
Basophils Absolute: 0 10*3/uL (ref 0.0–0.1)
Basophils Relative: 1 %
Eosinophils Absolute: 0.2 10*3/uL (ref 0.0–0.5)
Eosinophils Relative: 5 %
HCT: 34.8 % — ABNORMAL LOW (ref 36.0–46.0)
Hemoglobin: 11.4 g/dL — ABNORMAL LOW (ref 12.0–15.0)
Immature Granulocytes: 0 %
Lymphocytes Relative: 25 %
Lymphs Abs: 1 10*3/uL (ref 0.7–4.0)
MCH: 28.4 pg (ref 26.0–34.0)
MCHC: 32.8 g/dL (ref 30.0–36.0)
MCV: 86.8 fL (ref 80.0–100.0)
Monocytes Absolute: 0.3 10*3/uL (ref 0.1–1.0)
Monocytes Relative: 7 %
Neutro Abs: 2.5 10*3/uL (ref 1.7–7.7)
Neutrophils Relative %: 62 %
Platelet Count: 289 10*3/uL (ref 150–400)
RBC: 4.01 MIL/uL (ref 3.87–5.11)
RDW: 13.3 % (ref 11.5–15.5)
WBC Count: 4 10*3/uL (ref 4.0–10.5)
nRBC: 0 % (ref 0.0–0.2)

## 2022-06-27 LAB — BASIC METABOLIC PANEL - CANCER CENTER ONLY
Anion gap: 7 (ref 5–15)
BUN: <5 mg/dL — ABNORMAL LOW (ref 6–20)
CO2: 23 mmol/L (ref 22–32)
Calcium: 8.4 mg/dL — ABNORMAL LOW (ref 8.9–10.3)
Chloride: 105 mmol/L (ref 98–111)
Creatinine: 0.52 mg/dL (ref 0.44–1.00)
GFR, Estimated: >60 mL/min (ref >60)
Glucose, Bld: 88 mg/dL (ref 70–99)
Potassium: 3.4 mmol/L — ABNORMAL LOW (ref 3.5–5.1)
Sodium: 135 mmol/L (ref 135–145)

## 2022-06-27 MED ORDER — SODIUM CHLORIDE 0.9% FLUSH
10.0000 mL | Freq: Once | INTRAVENOUS | Status: AC
  Administered 2022-06-27: 10 mL via INTRAVENOUS
  Filled 2022-06-27: qty 10

## 2022-06-27 MED ORDER — LORATADINE 10 MG PO TABS
10.0000 mg | ORAL_TABLET | Freq: Every day | ORAL | 0 refills | Status: DC
  Filled 2022-06-27: qty 60, 60d supply, fill #0

## 2022-06-27 MED ORDER — LORAZEPAM 0.5 MG PO TABS
0.5000 mg | ORAL_TABLET | Freq: Four times a day (QID) | ORAL | 0 refills | Status: DC | PRN
  Filled 2022-06-27: qty 60, 15d supply, fill #0

## 2022-06-27 MED ORDER — HEPARIN SOD (PORK) LOCK FLUSH 100 UNIT/ML IV SOLN
500.0000 [IU] | Freq: Once | INTRAVENOUS | Status: AC
  Administered 2022-06-27: 500 [IU]
  Filled 2022-06-27: qty 5

## 2022-06-27 NOTE — Telephone Encounter (Signed)
Pt sent message to me about right leg pain, and her throat is hurting and I spoke to heather and she will get appt for the pt. Zenaida Niece pick her up 12:00. She will see the NP. Pt aware of this

## 2022-06-27 NOTE — Progress Notes (Unsigned)
Symptom Management Clinic  [facility] A Department of the [facility]. [facility] [address] [place], [place] [address] [phone] (phone) [phone] (fax)  Patient Care Team: Creig Hines, MD as PCP - General (Oncology) Jim Like, RN as Registered Nurse Scarlett Presto, RN (Inactive) as Registered Nurse Creig Hines, MD as Consulting Physician (Hematology and Oncology)   Name of the patient: Sophia [name]  [id number]  1981/02/21   Date of visit: 06/27/22  Diagnosis- Metastatic Breast Cancer  Chief complaint/ Reason for visit- allergic rhinitis & anxiety  Heme/Onc history:  Oncology History  Primary cancer of left breast with metastasis to other site  08/28/2019 Initial Diagnosis   Primary cancer of left breast with metastasis to other site [facility])   01/21/2021 Cancer Staging   Staging form: Breast, AJCC 8th Edition - Clinical stage from 01/21/2021: Stage IV (cT2, cN1, cM1, G2, ER-, PR-, HER2+) - Signed by Creig Hines, MD on 01/21/2021 Histologic grading system: 3 grade system   Primary malignant neoplasm of breast with metastasis  08/29/2019 Initial Diagnosis   Metastatic breast cancer (HCC)   09/04/2019 - 10/31/2021 Chemotherapy   Patient is on Treatment Plan : BREAST Weekly Paclitaxel + Trastuzumab + Pertuzumab q21d      Genetic Testing   Negative genetic testing. No pathogenic variants identified on the [facility] CancerNext-Expanded+RNA panel. The report date is 04/13/2020.   The CancerNext-Expanded + RNAinsight gene panel offered by [employer] and includes sequencing and rearrangement analysis for the following 77 genes: IP, ALK, APC*, ATM*, AXIN2, BAP1, BARD1, BLM, BMPR1A, BRCA1*, BRCA2*, BRIP1*, CDC73, CDH1*,CDK4, CDKN1B, CDKN2A, CHEK2*, CTNNA1, DICER1, FANCC, FH, FLCN, GALNT12, KIF1B, LZTR1, MAX, MEN1, MET, MLH1*, MSH2*, MSH3, MSH6*, MUTYH*, NBN, NF1*, NF2, NTHL1, PALB2*, PHOX2B,  PMS2*, POT1, PRKAR1A, PTCH1, PTEN*, RAD51C*, RAD51D*,RB1, RECQL, RET, SDHA, SDHAF2, SDHB, SDHC, SDHD, SMAD4, SMARCA4, SMARCB1, SMARCE1, STK11, SUFU, TMEM127, TP53*,TSC1, TSC2, VHL and XRCC2 (sequencing and deletion/duplication); EGFR, EGLN1, HOXB13, KIT, MITF, PDGFRA, POLD1 and POLE (sequencing only); EPCAM and GREM1 (deletion/duplication only). DNA and RNA analyses performed for * genes.   11/18/2021 - 11/18/2021 Chemotherapy   Patient is on Treatment Plan : BREAST Trastuzumab (8/6) IV D1 + Capecitabine +  Tucatinib q21d     11/18/2021 - 12/09/2021 Chemotherapy   Patient is on Treatment Plan : BREAST Trastuzumab (8/6) IV D1 + Capecitabine +  Tucatinib q21d     12/26/2021 -  Chemotherapy   Patient is on Treatment Plan : BREAST Trastuzumab (8/6) IV D1 + Capecitabine +  Tucatinib q21d       Interval history- Patient is 42 year old female with metastatic breast cancer who presents to [facility] for complaints of allergic congestion. Symptoms have started in past few days. She denies facial pressure, pain, sore throat, denies cough. Has itchy eyes and nose. Her mother accompanies her from [place]. She is grateful to have her mother to assist but mother is worried about short duration of her travel visa. Patient is increasingly anxious and worried about future, her declining health.   Review of systems- Review of Systems  Constitutional:  Positive for malaise/fatigue. Negative for chills, fever and weight loss.  HENT:  Positive for congestion. Negative for ear pain, nosebleeds, sinus pain and sore throat.   Eyes:  Negative for blurred vision and pain.  Respiratory:  Negative for cough, sputum production and shortness of breath.   Cardiovascular:  Negative for chest pain.  Musculoskeletal:  Negative for falls.  Skin:  Negative for itching and rash.  Neurological:  Positive for weakness.  Psychiatric/Behavioral:  Positive for depression. The patient is nervous/anxious and has insomnia.        No Known Allergies  Past Medical History:  Diagnosis Date   Anxiety    Breast cancer    with mets   Cancer    Colitis    COVID-19 in immunocompromised patient    Family history of colon cancer    Vertigo     Past Surgical History:  Procedure Laterality Date   BREAST BIOPSY Left 08/14/2019   [place] bx of mass, coil marker, path pending   BREAST BIOPSY Left 08/14/2019   [place] bx of LN, hydromarker, path pending   BREAST BIOPSY Left 08/14/2019   affirm bx of calcs, x marker, path pending   ESOPHAGOGASTRODUODENOSCOPY (EGD) WITH PROPOFOL N/A 10/05/2019   Procedure: ESOPHAGOGASTRODUODENOSCOPY (EGD) WITH PROPOFOL;  Surgeon: Toney Reil, MD;  Location: [facility] ENDOSCOPY;  Service: Gastroenterology;  Laterality: N/A;   FLEXIBLE SIGMOIDOSCOPY N/A 10/05/2019   Procedure: FLEXIBLE SIGMOIDOSCOPY;  Surgeon: Toney Reil, MD;  Location: [facility] ENDOSCOPY;  Service: Gastroenterology;  Laterality: N/A;   INTRAMEDULLARY (IM) NAIL INTERTROCHANTERIC Right 09/01/2019   Procedure: INTRAMEDULLARY (IM) NAIL INTERTROCHANTRIC AND RADIOFREQUENCY ABLATION;  Surgeon: Kennedy Bucker, MD;  Location: [facility] ORS;  Service: Orthopedics;  Laterality: Right;   KYPHOPLASTY N/A 08/29/2019   Procedure: KYPHOPLASTY T6, L1,L4 ,  RADIOFREQUENCY ABLATION;  Surgeon: Kennedy Bucker, MD;  Location: [facility] ORS;  Service: Orthopedics;  Laterality: N/A;   KYPHOPLASTY Right 09/01/2019   Procedure: Right Sacral Radiofrequency Ablation and Cement Augmentation, Right sacrum and iliac crest;  Surgeon: Kennedy Bucker, MD;  Location: [facility] ORS;  Service: Orthopedics;  Laterality: Right;   PORTA CATH INSERTION N/A 08/28/2019   Procedure: PORTA CATH INSERTION;  Surgeon: Annice Needy, MD;  Location: [facility] INVASIVE CV LAB;  Service: Cardiovascular;  Laterality: N/A;    Social History   Socioeconomic History   Marital status: Single    Spouse name: Not on file   Number of children: Not on file   Years of education: Not on file   Highest  education level: Not on file  Occupational History   Not on file  Tobacco Use   Smoking status: Never   Smokeless tobacco: Never  Vaping Use   Vaping Use: Never used  Substance and Sexual Activity   Alcohol use: Not Currently   Drug use: Not Currently   Sexual activity: Not Currently    Birth control/protection: None  Other Topics Concern   Not on file  Social History Narrative   Lives at home with children   Social Determinants of Health   Financial Resource Strain: High Risk (11/18/2021)   Overall Financial Resource Strain (CARDIA)    Difficulty of Paying Living Expenses: Very hard  Food Insecurity: Food Insecurity Present (06/23/2022)   Hunger Vital Sign    Worried About Running Out of Food in the Last Year: Often true    Ran Out of Food in the Last Year: Often true  Transportation Needs: Unmet Transportation Needs (06/23/2022)   PRAPARE - Administrator, Civil Service (Medical): Yes    Lack of Transportation (Non-Medical): Yes  Physical Activity: Inactive (11/18/2021)   Exercise Vital Sign    Days of Exercise per Week: 0 days    Minutes of Exercise per Session: 0 min  Stress: Stress Concern Present (11/18/2021)   [employer] of Occupational Health -  Occupational Stress Questionnaire    Feeling of Stress : Very much  Social Connections: Socially Isolated (06/23/2022)   Social Connection and Isolation Panel [NHANES]    Frequency of Communication with Friends and Family: Three times a week    Frequency of Social Gatherings with Friends and Family: Three times a week    Attends Religious Services: Never    Active Member of Clubs or Organizations: No    Attends Banker Meetings: Never    Marital Status: Never married  Intimate Partner Violence: Not At Risk (11/18/2021)   Humiliation, Afraid, Rape, and Kick questionnaire    Fear of Current or Ex-Partner: No    Emotionally Abused: No    Physically Abused: No    Sexually Abused: No    Family History   Problem Relation Age of Onset   Colon cancer Maternal Uncle      Current Outpatient Medications:    DULoxetine (CYMBALTA) 60 MG capsule, Take 1 capsule (60 mg total) by mouth daily., Disp: 30 capsule, Rfl: 3   gabapentin (NEURONTIN) 600 MG tablet, Take 1 tablet (600 mg total) by mouth 3 (three) times daily., Disp: 90 tablet, Rfl: 3   lidocaine-prilocaine (EMLA) cream, Apply 1 Application topically as needed. Apply small amount to port site at least 1 hour prior to it being accessed, cover with plastic wrap, Disp: 30 g, Rfl: 3   LORazepam (ATIVAN) 0.5 MG tablet, Take 1 tablet (0.5 mg total) by mouth every 6 (six) hours as needed for anxiety (and nausea). AS NEEDED FOR NAUSEA, Disp: 60 tablet, Rfl: 0   morphine (MS CONTIN) 30 MG 12 hr tablet, Take 1 tablet (30 mg total) by mouth every 12 (twelve) hours., Disp: 60 tablet, Rfl: 0   nitrofurantoin, macrocrystal-monohydrate, (MACROBID) 100 MG capsule, Take 1 capsule (100 mg total) by mouth 2 (two) times daily., Disp: 14 capsule, Rfl: 0   OLANZapine (ZYPREXA) 10 MG tablet, Take 1 tablet (10 mg total) by mouth at bedtime as needed (nausea)., Disp: 30 tablet, Rfl: 2   omeprazole (PRILOSEC) 20 MG capsule, Take 1 capsule (20 mg total) by mouth daily., Disp: 30 capsule, Rfl: 3   ondansetron (ZOFRAN) 8 MG tablet, Take 1 tablet (8 mg total) by mouth every 8 (eight) hours as needed for nausea or vomiting., Disp: 45 tablet, Rfl: 2   TUKYSA 150 MG tablet, Take 2 tablets (300mg ) by mouth twice daily as directed by physician., Disp: 120 tablet, Rfl: 2   albuterol (VENTOLIN HFA) 108 (90 Base) MCG/ACT inhaler, Inhale 2 puffs into the lungs every 6 (six) hours as needed for wheezing or shortness of breath. (Patient not taking: Reported on 06/27/2022), Disp: 8.5 g, Rfl: 2   butalbital-acetaminophen-caffeine (FIORICET) 50-325-40 MG tablet, Take 1 tablet by mouth every 8 (eight) hours as needed for headache. (Patient not taking: Reported on 06/27/2022), Disp: 30 tablet, Rfl:  1   Calcium Carb-Cholecalciferol 600-10 MG-MCG TABS, Take 1 tablet by mouth 2 (two) times daily., Disp: 180 tablet, Rfl: 0   capecitabine (XELODA) 500 MG tablet, Take 3 tablets (1,500 mg total) by mouth 2 (two) times daily after a meal. Take for 14 days, then hold for 7 days. Repeat every 21 days., Disp: 84 tablet, Rfl: 2   cyclobenzaprine (FLEXERIL) 10 MG tablet, Take 1 tablet (10 mg total) by mouth 3 (three) times daily as needed. (Patient not taking: Reported on 06/27/2022), Disp: 30 tablet, Rfl: 0   diphenoxylate-atropine (LOMOTIL) 2.5-0.025 MG tablet, Take 1 tablet by mouth 4 (  four) times daily as needed for diarrhea or loose stools. (Patient not taking: Reported on 06/27/2022), Disp: 40 tablet, Rfl: 0   HYDROmorphone (DILAUDID) 2 MG tablet, Take 0.5-1 tablets (1-2 mg total) by mouth every 4 (four) hours as needed for severe pain. (Patient not taking: Reported on 06/27/2022), Disp: 60 tablet, Rfl: 0   naloxone (NARCAN) nasal spray 4 mg/0.1 mL, Administer one spray in nostril for opioid overdose. If unresponsive after 1 dose, may repeat a dose every 2-3 minutes in alternating nostrils until emergency services arrive. (Patient not taking: Reported on 06/27/2022), Disp: 2 each, Rfl: 3   polyethylene glycol powder (MIRALAX) 17 GM/SCOOP powder, Take 17 g by mouth daily as needed. (Patient not taking: Reported on 06/27/2022), Disp: 238 g, Rfl: 3 No current facility-administered medications for this visit.  Facility-Administered Medications Ordered in Other Visits:    0.9 %  sodium chloride infusion, , Intravenous, Continuous, Burns, Renda Rolls, NP, Stopped at 06/02/20 1453   heparin lock flush 100 unit/mL, 500 Units, Intravenous, Once, Creig Hines, MD   heparin lock flush 100 unit/mL, 500 Units, Intravenous, Once, Creig Hines, MD   sodium chloride flush (NS) 0.9 % injection 10 mL, 10 mL, Intravenous, Once, Creig Hines, MD   sodium chloride flush (NS) 0.9 % injection 10 mL, 10 mL, Intravenous, Once, Creig Hines, MD  Physical exam:  Vitals:   06/27/22 1315  BP: 116/85  Pulse: (!) 59  Resp: 18  Temp: 98 F (36.7 C)  TempSrc: Tympanic   Physical Exam Vitals reviewed.  Constitutional:      General: She is not in acute distress. HENT:     Right Ear: Tympanic membrane and ear canal normal. No tenderness. Tympanic membrane is not erythematous.     Left Ear: Tympanic membrane and ear canal normal. No tenderness. Tympanic membrane is not erythematous.     Nose: Congestion and rhinorrhea present.     Mouth/Throat:     Mouth: Mucous membranes are moist.     Pharynx: Oropharynx is clear. No oropharyngeal exudate or posterior oropharyngeal erythema.  Eyes:     Conjunctiva/sclera: Conjunctivae normal.  Cardiovascular:     Rate and Rhythm: Normal rate and regular rhythm.  Pulmonary:     Effort: Pulmonary effort is normal. No respiratory distress.  Skin:    General: Skin is warm and dry.  Neurological:     Mental Status: She is alert and oriented to person, place, and time.  Psychiatric:        Attention and Perception: Attention normal.        Mood and Affect: Affect is tearful.        Behavior: Behavior is cooperative.         Latest Ref Rng & Units 06/23/2022    9:57 AM  CMP  Glucose 70 - 99 mg/dL 409   BUN 6 - 20 mg/dL 7   Creatinine 8.11 - 9.14 mg/dL 7.82   Sodium 956 - 213 mmol/L 135   Potassium 3.5 - 5.1 mmol/L 3.3   Chloride 98 - 111 mmol/L 106   CO2 22 - 32 mmol/L 25   Calcium 8.9 - 10.3 mg/dL 8.3   Total Protein 6.5 - 8.1 g/dL 7.6   Total Bilirubin 0.3 - 1.2 mg/dL 0.4   Alkaline Phos 38 - 126 U/L 76   AST 15 - 41 U/L 23   ALT 0 - 44 U/L 10       Latest Ref Rng &  Units 06/27/2022    1:13 PM  CBC  WBC 4.0 - 10.5 K/uL 4.0   Hemoglobin 12.0 - 15.0 g/dL 16.1   Hematocrit 09.6 - 46.0 % 34.8   Platelets 150 - 400 K/uL 289     No images are attached to the encounter.  CT Head Wo Contrast  Result Date: 06/12/2022 CLINICAL DATA:  Posttraumatic headache EXAM: CT  HEAD WITHOUT CONTRAST TECHNIQUE: Contiguous axial images were obtained from the base of the skull through the vertex without intravenous contrast. RADIATION DOSE REDUCTION: This exam was performed according to the departmental dose-optimization program which includes automated exposure control, adjustment of the mA and/or kV according to patient size and/or use of iterative reconstruction technique. COMPARISON:  MRI head 05/07/2022.  CT head 02/15/2022. FINDINGS: Brain: There is new white matter edema throughout the left frontal lobe and frontal parietal region. Cortical calcifications in the bilateral frontal lobes minimally increased when compared to the prior study. There is no significant midline shift or mass effect. Ventricles are normal in size. No extra-axial fluid collection identified. Vascular: No hyperdense vessel or unexpected calcification. Skull: Normal. Negative for fracture or focal lesion. Sinuses/Orbits: No acute finding. Other: None. IMPRESSION: 1. New white matter edema throughout the left frontal lobe and frontal parietal region worrisome for vasogenic edema. Findings are concerning for metastatic disease. 2. No acute hemorrhage. Electronically Signed   By: Darliss Cheney M.D.   On: 06/12/2022 16:10   CT Cervical Spine Wo Contrast  Result Date: 06/12/2022 CLINICAL DATA:  Trauma EXAM: CT CERVICAL SPINE WITHOUT CONTRAST TECHNIQUE: Multidetector CT imaging of the cervical spine was performed without intravenous contrast. Multiplanar CT image reconstructions were also generated. RADIATION DOSE REDUCTION: This exam was performed according to the departmental dose-optimization program which includes automated exposure control, adjustment of the mA and/or kV according to patient size and/or use of iterative reconstruction technique. COMPARISON:  PET CT 08/27/19 FINDINGS: Alignment: There is straightening of the normal cervical lordosis Skull base and vertebrae: No acute fracture. There is a sclerotic  lesion at the posterior aspect of the C5 vertebral body (series 6, image 33) which is favored to represent a treated metastatic lesion given findings on prior PET-CT dated 08/27/2019. There is an additional lucent lesion in the C2 vertebral body (series 6, image 33) with a small central region of sclerosis, this is also favored to represent a metastatic lesion, likely treated. No definite evidence of a new lytic lesion in the cervical spine Soft tissues and spinal canal: No prevertebral fluid or swelling. No visible canal hematoma. Disc levels:  No evidence of high-grade spinal canal stenosis. Upper chest: Partially visualized right chest port in place. Visualized lung apices are clear. Other: None. IMPRESSION: 1. No acute cervical spine fracture. 2. Sclerotic lesion at the posterior aspect of the C5 vertebral body, favored to represent a treated metastatic lesion given findings on prior PET-CT dated 08/27/2019. Additional lucent lesion in the C2 vertebral body with a small central region of sclerosis, also favored to represent a treated metastatic lesion. No definite evidence of a new lytic lesion in the cervical spine. Electronically Signed   By: Lorenza Cambridge M.D.   On: 06/12/2022 15:59    Assessment and plan- Patient is a 42 y.o. female diagnosed with metastatic breast cancer who returns to clinic for complaints of   Allergic rhinitis- mild symptoms. Recommend starting daily claritin. Can trial flonase as well.  Anxiety associated with cancer diagnosis- refilled lorazepam 0.5 mg q6h prn for acute anxiety  symptoms. Discussed improvement in her mental and physical health by having her mother present. Discussed options for how to help her mother with documentation to extend her visa.   RTC as scheduled or sooner if symptoms do not improve or worsen.   Due to language barrier, a spanish language interpreter, Kandis Cocking, participated in all patient interactions.    Visit Diagnosis 1. Seasonal allergic  rhinitis due to pollen   2. Anxiety associated with cancer diagnosis [facility])     Patient expressed understanding and was in agreement with this plan. She also understands that She can call clinic at any time with any questions, concerns, or complaints.   Thank you for allowing me to participate in the care of this very pleasant patient.   Consuello Masse, DNP, AGNP-C, AOCNP [facility] [phone]

## 2022-06-28 ENCOUNTER — Inpatient Hospital Stay (HOSPITAL_BASED_OUTPATIENT_CLINIC_OR_DEPARTMENT_OTHER): Payer: Self-pay | Admitting: Hospice and Palliative Medicine

## 2022-06-28 ENCOUNTER — Other Ambulatory Visit: Payer: Self-pay | Admitting: Hospice and Palliative Medicine

## 2022-06-28 DIAGNOSIS — G893 Neoplasm related pain (acute) (chronic): Secondary | ICD-10-CM

## 2022-06-28 DIAGNOSIS — C7931 Secondary malignant neoplasm of brain: Secondary | ICD-10-CM

## 2022-06-28 DIAGNOSIS — Z515 Encounter for palliative care: Secondary | ICD-10-CM

## 2022-06-28 DIAGNOSIS — C50919 Malignant neoplasm of unspecified site of unspecified female breast: Secondary | ICD-10-CM

## 2022-06-28 DIAGNOSIS — C7951 Secondary malignant neoplasm of bone: Secondary | ICD-10-CM

## 2022-06-28 NOTE — Progress Notes (Signed)
Virtual Visit via Telephone Note  I connected with Sophia [name] on 06/28/22 at  by telephone and verified that I am speaking with the correct person using two identifiers.  Location: Patient: Home Provider: Clinic   I discussed the limitations, risks, security and privacy concerns of performing an evaluation and management service by telephone and the availability of in person appointments. I also discussed with the patient that there may be a patient responsible charge related to this service. The patient expressed understanding and agreed to proceed.   History of Present Illness: Sophia [name] is a 42 y.o. 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 MRI of the brain on 02/08/2021 revealed widespread metastatic disease to the brain with mild associated edema and small area of hemorrhage in the right frontal.  Patient is status post whole brain radiation.  She completed Taxol and was on Herceptin and Perjeta maintenance.  She is now on trastuzumab with Xeloda and tucatinib.    Observations/Objective: Spoke with patient by phone.  She reports that she is having good days and bad in regards to the pain but says that today is a good day.  She denies severe pain at present.  We did discuss her pain regimen in detail and she says that she is using the MS Contin twice daily but rarely using the hydromorphone for breakthrough pain.  I discussed and encouraged her to utilize the hydromorphone as needed.  Patient is now more reliant on family for assistance at home.  Her mother is here staying with her on a temporary visa.  Patient became tearful as she described not having seen her mother in decades.  Patient will benefit from future care coordination and was in agreement with home palliative care/social work.  Assessment and Plan: Stage IV metastatic breast cancer -on treatment with on trastuzumab  with Xeloda and tucatinib.  Patient is pending repeat imaging with MRI of the brain, CTs, and bone scan with subsequent follow-up with [name]  Neoplasm related pain -continue MS Contin/hydromorphone  Weakness -patient mother is staying with her temporarily to help with care.  Will refer to home palliative care/social work.  Follow Up Instructions: Follow-up telephone visit 1 month   I discussed the assessment and treatment plan with the patient. The patient was provided an opportunity to ask questions and all were answered. The patient agreed with the plan and demonstrated an understanding of the instructions.   The patient was advised to call back or seek an in-person evaluation if the symptoms worsen or if the condition fails to improve as anticipated.  I provided 10 minutes of non-face-to-face time during this encounter.   Malachy Moan, NP

## 2022-06-29 ENCOUNTER — Other Ambulatory Visit: Payer: Self-pay

## 2022-06-29 ENCOUNTER — Encounter: Payer: Self-pay | Admitting: Oncology

## 2022-06-29 ENCOUNTER — Other Ambulatory Visit: Payer: Self-pay | Admitting: *Deleted

## 2022-06-29 DIAGNOSIS — Z515 Encounter for palliative care: Secondary | ICD-10-CM

## 2022-06-29 MED ORDER — MORPHINE SULFATE ER 30 MG PO TBCR
30.0000 mg | EXTENDED_RELEASE_TABLET | Freq: Two times a day (BID) | ORAL | 0 refills | Status: DC
  Filled 2022-06-29: qty 60, 30d supply, fill #0

## 2022-06-29 MED ORDER — HYDROMORPHONE HCL 2 MG PO TABS
1.0000 mg | ORAL_TABLET | ORAL | 0 refills | Status: DC | PRN
  Filled 2022-06-29: qty 60, 10d supply, fill #0

## 2022-06-29 NOTE — Progress Notes (Signed)
TELEPHONE ENCOUNTER  Palliative care SW connected with patient patient to discuss new PC referral. Patient shared she is open to in home PC services and updated SW with her actual address: [address] [place].  Patient states she is doing well today with no complaints or concerns. Recently spoke with oncology office yesterday and had all concerns addressed at that time.  Plan: Palliative Care SW to follow up in person 4/25 @10  AM.

## 2022-06-30 ENCOUNTER — Other Ambulatory Visit: Payer: Self-pay

## 2022-07-04 ENCOUNTER — Inpatient Hospital Stay: Payer: Self-pay

## 2022-07-04 ENCOUNTER — Ambulatory Visit
Admission: RE | Admit: 2022-07-04 | Discharge: 2022-07-04 | Disposition: A | Discharge status: Home / Self Care | Payer: Self-pay | Source: Ambulatory Visit | Attending: Oncology | Admitting: Oncology

## 2022-07-04 ENCOUNTER — Other Ambulatory Visit: Payer: Self-pay

## 2022-07-04 DIAGNOSIS — M8440XA Pathological fracture, unspecified site, initial encounter for fracture: Secondary | ICD-10-CM

## 2022-07-04 DIAGNOSIS — C50919 Malignant neoplasm of unspecified site of unspecified female breast: Secondary | ICD-10-CM

## 2022-07-04 MED ORDER — GADOBUTROL 1 MMOL/ML IV SOLN
6.0000 mL | Freq: Once | INTRAVENOUS | Status: AC | PRN
  Administered 2022-07-04: 6 mL via INTRAVENOUS

## 2022-07-04 MED ORDER — IOHEXOL 300 MG/ML  SOLN
100.0000 mL | Freq: Once | INTRAMUSCULAR | Status: AC | PRN
  Administered 2022-07-04: 100 mL via INTRAVENOUS

## 2022-07-05 ENCOUNTER — Other Ambulatory Visit: Payer: Self-pay | Admitting: *Deleted

## 2022-07-05 DIAGNOSIS — R300 Dysuria: Secondary | ICD-10-CM

## 2022-07-06 ENCOUNTER — Encounter
Admission: RE | Admit: 2022-07-06 | Discharge: 2022-07-06 | Disposition: A | Discharge status: Home / Self Care | Payer: Self-pay | Source: Ambulatory Visit | Attending: Oncology | Admitting: Oncology

## 2022-07-06 ENCOUNTER — Encounter: Payer: Self-pay | Admitting: Oncology

## 2022-07-06 ENCOUNTER — Inpatient Hospital Stay: Payer: Self-pay

## 2022-07-06 DIAGNOSIS — C50919 Malignant neoplasm of unspecified site of unspecified female breast: Secondary | ICD-10-CM | POA: Insufficient documentation

## 2022-07-06 DIAGNOSIS — M8440XA Pathological fracture, unspecified site, initial encounter for fracture: Secondary | ICD-10-CM | POA: Insufficient documentation

## 2022-07-06 MED ORDER — TECHNETIUM TC 99M MEDRONATE IV KIT
23.9800 | PACK | Freq: Once | INTRAVENOUS | Status: AC | PRN
  Administered 2022-07-06: 23.98 via INTRAVENOUS

## 2022-07-11 ENCOUNTER — Encounter: Payer: Self-pay | Admitting: Oncology

## 2022-07-11 ENCOUNTER — Inpatient Hospital Stay: Payer: Self-pay

## 2022-07-11 ENCOUNTER — Inpatient Hospital Stay (HOSPITAL_BASED_OUTPATIENT_CLINIC_OR_DEPARTMENT_OTHER): Payer: Self-pay | Admitting: Oncology

## 2022-07-11 ENCOUNTER — Other Ambulatory Visit: Payer: Self-pay | Admitting: *Deleted

## 2022-07-11 ENCOUNTER — Other Ambulatory Visit: Payer: Self-pay

## 2022-07-11 VITALS — BP 109/76 | HR 74 | Temp 98.6°F | Resp 16 | Ht 59.0 in | Wt 123.4 lb

## 2022-07-11 DIAGNOSIS — C50919 Malignant neoplasm of unspecified site of unspecified female breast: Secondary | ICD-10-CM

## 2022-07-11 DIAGNOSIS — M8440XA Pathological fracture, unspecified site, initial encounter for fracture: Secondary | ICD-10-CM

## 2022-07-11 DIAGNOSIS — Z7189 Other specified counseling: Secondary | ICD-10-CM

## 2022-07-11 MED ORDER — LORAZEPAM 0.5 MG PO TABS
0.5000 mg | ORAL_TABLET | Freq: Four times a day (QID) | ORAL | 0 refills | Status: DC | PRN
  Filled 2022-07-11: qty 60, 15d supply, fill #0

## 2022-07-11 MED ORDER — DULOXETINE HCL 60 MG PO CPEP
60.0000 mg | ORAL_CAPSULE | Freq: Every day | ORAL | 3 refills | Status: DC
  Filled 2022-07-11: qty 30, 30d supply, fill #0
  Filled 2022-08-11: qty 30, 30d supply, fill #1

## 2022-07-11 MED ORDER — GABAPENTIN 600 MG PO TABS
600.0000 mg | ORAL_TABLET | Freq: Three times a day (TID) | ORAL | 3 refills | Status: DC
  Filled 2022-07-11: qty 90, 30d supply, fill #0
  Filled 2022-08-11: qty 90, 30d supply, fill #1

## 2022-07-11 NOTE — Progress Notes (Signed)
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  History obtained with the help of an interpreter.Patient self palpated left breast mass which was followed by a diagnostic bilateral mammogram.  Mammogram showed 3.1 x 2.9 x 1.9 cm hypoechoic mass at the 1 o'clock position of the left breast.  For abnormal cortically thickened left axillary lymph nodes measuring up to 5 mm.  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  Patient was also having ongoing back pain and was seen by [facility] orthoped[facility] Dr. Izola Price who ordered MRI lumbar spine without contrast which showed possible pathologic fractures of L1 and L4 vertebral bodies with greater than 50% height loss at L1 and abnormal signal involving L2-L3 S1 as well as right iliac bone concerning for metastatic disease.  Patient is a single mother of 3 adult children and is very anxious today.  She reports significant back pain which radiates to her bilateral thighs.  Denies any focal tingling numbness or weakness.  Denies any bowel bladder incontinence.  Pain has been uncontrolled despite taking Tylenol.  No prior history of abnormal breast biopsies.  No family history of breast cancer    PET and MRI showed 3 areas of pathologic fracture of her spine as well as widespread bony metastatic disease and concern for impending fracture of the right hip.  Given her worsening pain she was asked to come to the ER.  She has been evaluated by Dr. Rosita Kea from orthopedic surgery and underwent kyphoplasty at 3 different levels. T6 L1 and L4 along with radiofrequency ablation.    She also underwent prophylactic fixation of the right hip and not affected the sacral region.     Patient received first dose of Herceptin and Perjeta on 09/04/2019.  Patient initially received Taxol and Herceptin perjeta followed by maintenance HP.  Patient found to have brain mets in November 2022 for which she received whole brain radiation treatment but there was no evidence of systemic progression.  She was continued on Herceptin and Perjeta.  Repeat MRI in June 2023 showed slight progression of brain mets.  Patient was switched to Herceptin tucatinib Xeloda    Interval history-patient has intermittent pain and tingling in her left hand.  She has not had any recent falls.  She is able to ambulate around the house.  ECOG PS- 2 Pain scale- 3 Opioid associated constipation- no  Review of systems- Review of Systems  Constitutional:  Positive for malaise/fatigue. Negative for chills, fever and weight loss.  HENT:  Negative for congestion, ear discharge and nosebleeds.   Eyes:  Negative for blurred vision.  Respiratory:  Negative for cough, hemoptysis, sputum production, shortness of breath and wheezing.   Cardiovascular:  Negative for chest pain, palpitations, orthopnea and claudication.  Gastrointestinal:  Negative for abdominal pain, blood in stool, constipation, diarrhea, heartburn, melena, nausea and vomiting.  Genitourinary:  Negative for dysuria, flank pain, frequency, hematuria and urgency.  Musculoskeletal:  Negative for back pain, joint pain and myalgias.  Skin:  Negative for rash.  Neurological:  Positive for  sensory change (Peripheral neuropathy). Negative for dizziness, tingling, focal weakness, seizures, weakness and headaches.  Endo/Heme/Allergies:  Does not bruise/bleed easily.  Psychiatric/Behavioral:  Negative for depression and suicidal ideas. The patient does not have insomnia.       No Known Allergies   Past Medical History:  Diagnosis Date   Anxiety    Breast cancer    with mets   Cancer    Colitis    COVID-19 in immunocompromised patient    Family history of colon cancer    Vertigo      Past Surgical History:  Procedure Laterality Date   BREAST BIOPSY Left 08/14/2019   [place] bx of mass, coil marker, path pending   BREAST BIOPSY Left 08/14/2019   [place] bx of LN, hydromarker, path pending   BREAST BIOPSY Left 08/14/2019   affirm bx of calcs, x marker, path pending   ESOPHAGOGASTRODUODENOSCOPY (EGD) WITH PROPOFOL N/A 10/05/2019   Procedure: ESOPHAGOGASTRODUODENOSCOPY (EGD) WITH PROPOFOL;  Surgeon: Toney Reil, MD;  Location: [facility] ENDOSCOPY;  Service: Gastroenterology;  Laterality: N/A;   FLEXIBLE SIGMOIDOSCOPY N/A 10/05/2019   Procedure: FLEXIBLE SIGMOIDOSCOPY;  Surgeon: Toney Reil, MD;  Location: [facility] ENDOSCOPY;  Service: Gastroenterology;  Laterality: N/A;   INTRAMEDULLARY (IM) NAIL INTERTROCHANTERIC Right 09/01/2019   Procedure: INTRAMEDULLARY (IM) NAIL INTERTROCHANTRIC AND RADIOFREQUENCY ABLATION;  Surgeon: Kennedy Bucker, MD;  Location: [facility] ORS;  Service: Orthopedics;  Laterality: Right;   KYPHOPLASTY N/A 08/29/2019   Procedure: KYPHOPLASTY T6, L1,L4 ,  RADIOFREQUENCY ABLATION;  Surgeon: Kennedy Bucker, MD;  Location: [facility] ORS;  Service: Orthopedics;  Laterality: N/A;   KYPHOPLASTY Right 09/01/2019   Procedure: Right Sacral Radiofrequency Ablation and Cement Augmentation, Right sacrum and iliac crest;  Surgeon: Kennedy Bucker, MD;  Location: [facility] ORS;  Service: Orthopedics;  Laterality: Right;   PORTA CATH INSERTION N/A 08/28/2019   Procedure: PORTA CATH  INSERTION;  Surgeon: Annice Needy, MD;  Location: [facility] INVASIVE CV LAB;  Service: Cardiovascular;  Laterality: N/A;    Social History   Socioeconomic History   Marital status: Single    Spouse name: Not on file   Number of children: Not on file   Years of education: Not on file   Highest education level: Not on file  Occupational History   Not on file  Tobacco Use   Smoking status: Never   Smokeless tobacco: Never  Vaping Use   Vaping Use: Never used  Substance and Sexual Activity   Alcohol use: Not Currently   Drug use: Not Currently   Sexual activity: Not Currently    Birth control/protection: None  Other Topics Concern   Not on file  Social History Narrative   Lives at home with children   Social Determinants of Health   Financial Resource Strain: High Risk (11/18/2021)   Overall Financial Resource Strain (CARDIA)    Difficulty of Paying Living Expenses: Very hard  Food Insecurity: Food Insecurity Present (06/23/2022)   Hunger Vital Sign    Worried About Running Out of Food in the Last Year: Often true    Ran Out of Food in the Last Year: Often true  Transportation Needs: Unmet Transportation Needs (07/11/2022)   PRAPARE - Transportation    Lack  of Transportation (Medical): Yes    Lack of Transportation (Non-Medical): Yes  Physical Activity: Inactive (11/18/2021)   Exercise Vital Sign    Days of Exercise per Week: 0 days    Minutes of Exercise per Session: 0 min  Stress: Stress Concern Present (11/18/2021)   [employer] of Occupational Health - Occupational Stress Questionnaire    Feeling of Stress : Very much  Social Connections: Socially Isolated (06/23/2022)   Social Connection and Isolation Panel [NHANES]    Frequency of Communication with Friends and Family: Three times a week    Frequency of Social Gatherings with Friends and Family: Three times a week    Attends Religious Services: Never    Active Member of Clubs or Organizations: No    Attends Tax inspector Meetings: Never    Marital Status: Never married  Intimate Partner Violence: Not At Risk (11/18/2021)   Humiliation, Afraid, Rape, and Kick questionnaire    Fear of Current or Ex-Partner: No    Emotionally Abused: No    Physically Abused: No    Sexually Abused: No    Family History  Problem Relation Age of Onset   Colon cancer Maternal Uncle      Current Outpatient Medications:    Calcium Carb-Cholecalciferol 600-10 MG-MCG TABS, Take 1 tablet by mouth 2 (two) times daily., Disp: 180 tablet, Rfl: 0   capecitabine (XELODA) 500 MG tablet, Take 3 tablets (1,500 mg total) by mouth 2 (two) times daily after a meal. Take for 14 days, then hold for 7 days. Repeat every 21 days., Disp: 84 tablet, Rfl: 2   HYDROmorphone (DILAUDID) 2 MG tablet, Take 0.5-1 tablets (1-2 mg total) by mouth every 4 (four) hours as needed for severe pain., Disp: 60 tablet, Rfl: 0   lidocaine-prilocaine (EMLA) cream, Apply 1 Application topically as needed. Apply small amount to port site at least 1 hour prior to it being accessed, cover with plastic wrap, Disp: 30 g, Rfl: 3   loratadine (CLARITIN) 10 MG tablet, Take 1 tablet (10 mg total) by mouth daily. For seasonal allergies, Disp: 60 tablet, Rfl: 0   morphine (MS CONTIN) 30 MG 12 hr tablet, Take 1 tablet (30 mg total) by mouth every 12 (twelve) hours., Disp: 60 tablet, Rfl: 0   nitrofurantoin, macrocrystal-monohydrate, (MACROBID) 100 MG capsule, Take 1 capsule (100 mg total) by mouth 2 (two) times daily., Disp: 14 capsule, Rfl: 0   OLANZapine (ZYPREXA) 10 MG tablet, Take 1 tablet (10 mg total) by mouth at bedtime as needed (nausea)., Disp: 30 tablet, Rfl: 2   omeprazole (PRILOSEC) 20 MG capsule, Take 1 capsule (20 mg total) by mouth daily., Disp: 30 capsule, Rfl: 3   ondansetron (ZOFRAN) 8 MG tablet, Take 1 tablet (8 mg total) by mouth every 8 (eight) hours as needed for nausea or vomiting., Disp: 45 tablet, Rfl: 2   TUKYSA 150 MG tablet, Take 2 tablets  ( ) by mouth twice daily as directed by physician., Disp: 120 tablet, Rfl: 2   DULoxetine (CYMBALTA) 60 MG capsule, Take 1 capsule (60 mg total) by mouth daily., Disp: 30 capsule, Rfl: 3   gabapentin (NEURONTIN) 600 MG tablet, Take 1 tablet (600 mg total) by mouth 3 (three) times daily., Disp: 90 tablet, Rfl: 3   LORazepam (ATIVAN) 0.5 MG tablet, Take 1 tablet (0.5 mg total) by mouth every 6 (six) hours as needed for anxiety (and nausea). AS NEEDED FOR NAUSEA, Disp: 60 tablet, Rfl: 0 No current facility-administered medications for this  visit.  Facility-Administered Medications Ordered in Other Visits:    0.9 %  sodium chloride infusion, , Intravenous, Continuous, Burns, Renda Rolls, NP, Stopped at 06/02/20 1453   heparin lock flush 100 unit/mL, 500 Units, Intravenous, Once, Creig Hines, MD   heparin lock flush 100 unit/mL, 500 Units, Intravenous, Once, Creig Hines, MD   sodium chloride flush (NS) 0.9 % injection 10 mL, 10 mL, Intravenous, Once, Creig Hines, MD   sodium chloride flush (NS) 0.9 % injection 10 mL, 10 mL, Intravenous, Once, Creig Hines, MD  Physical exam:  Vitals:   07/11/22 0858  BP: 109/76  Pulse: 74  Resp: 16  Temp: 98.6 F (37 C)  TempSrc: Tympanic  SpO2: 100%  Weight: 123 lb 6.4 oz (56 kg)  Height: 4\' 11"  (1.499 m)   Physical Exam Constitutional:      Comments: Sitting in a wheelchair.  Appears anxious and fatigued  Cardiovascular:     Rate and Rhythm: Normal rate and regular rhythm.     Heart sounds: Normal heart sounds.  Pulmonary:     Effort: Pulmonary effort is normal.     Breath sounds: Normal breath sounds.  Skin:    General: Skin is warm and dry.  Neurological:     Mental Status: She is alert and oriented to person, place, and time.         Latest Ref Rng & Units 06/27/2022    1:13 PM  CMP  Glucose 70 - 99 mg/dL 88   BUN 6 - 20 mg/dL <5   Creatinine 1.61 - 1.00 mg/dL 0.96   Sodium 045 - 409 mmol/L 135   Potassium 3.5 - 5.1  mmol/L 3.4   Chloride 98 - 111 mmol/L 105   CO2 22 - 32 mmol/L 23   Calcium 8.9 - 10.3 mg/dL 8.4       Latest Ref Rng & Units 06/27/2022    1:13 PM  CBC  WBC 4.0 - 10.5 K/uL 4.0   Hemoglobin 12.0 - 15.0 g/dL 81.1   Hematocrit 91.4 - 46.0 % 34.8   Platelets 150 - 400 K/uL 289       NM Bone Scan Whole Body  Result Date: 07/10/2022 CLINICAL DATA:  Metastatic breast cancer. EXAM: NUCLEAR MEDICINE WHOLE BODY BONE SCAN TECHNIQUE: Whole body anterior and posterior images were obtained approximately 3 hours after intravenous injection of radiopharmaceutical. RADIOPHARMACEUTICALS:  23.98 mCi Technetium-29m MDP IV COMPARISON:  MRI brain 07/04/2022. CT chest and abdomen 07/04/2022. CT cervical spine and head 06/12/2022. Bone scan 04/27/2022. FINDINGS: Bilateral renal function and excretion. Multifocal areas of increased activity are again noted in a left anterior rib, a midthoracic vertebral body, 2 lumbar vertebra, left acetabulum, throughout the right femur and distal left femur. These appear unchanged. IMPRESSION: Stable appearance of diffuse metastatic disease. Electronically Signed   By: Maisie Fus  Register M.D.   On: 07/10/2022 08:20   MR BRAIN W WO CONTRAST  Result Date: 07/07/2022 CLINICAL DATA:  Multiple pathological fractures, initial encounter M84.40XA (ICD-10-CM). Primary malignant neoplasm of breast with metastasis C50.919 (ICD-10-CM). EXAM: MRI HEAD WITHOUT AND WITH CONTRAST TECHNIQUE: Multiplanar, multiecho pulse sequences of the brain and surrounding structures were obtained without and with intravenous contrast. CONTRAST:  6mL GADAVIST GADOBUTROL 1 MMOL/ML IV SOLN COMPARISON:  MRI of the brain May 05, 2022. FINDINGS: Brain: No acute infarction, hemorrhage, hydrocephalus or extra-axial collection. Again demonstrated are 12 enhance in brain lesions, labeled series 18, as described below: 1. Left cerebellar hemisphere, 7  mm (5 mm on prior), image 23. 2. Left cerebellar hemisphere, 15 mm (12  on prior), image 32. 3. Right cerebellar hemisphere, 5 mm (4 mm on prior), image 34. 4. Right cerebellar hemisphere, 7 mm (5 mm on prior), image 35. 5. Right occipital lobe, 6 mm (4 mm on prior), image 61. 6. Left frontal lobe, 20 mm (14 mm on prior), image 84. 7. Right frontal lobe, 13 mm (12 mm on prior), image 90. 8. Left frontal lobe, 5 mm (3 mm on prior), image 103. 9. Left parietal lobe, 12 mm (8 mm on prior), image 109. 10. Left frontal lobe, 4 mm (3 mm on prior), image 113. 11. Right frontal lobe, 9 mm (9 mm on prior), image 129. 12. Left frontal lobe, 7 mm (5 mm on prior), image 133. All but 1 lesion have grown since prior MRI, as detailed above. There is evidence of leptomeningeal spread associated with a left frontal lesion, series 18, image 83, and a left parietal lesion, as seen on series 18, image 104. A right frontal lesion also appear to extend to the level the meningeal surface (series 20, image 4). There is significant increase in the confluent T2 hyperintensity within the white matter of the bilateral frontoparietal lobes, left greater than right, which could represent a combination of vasogenic edema and posttreatment changes. There is also increase in the T2 hyperintensity within the left bilateral cerebellar hemisphere, and less evident on the right. Vascular: Normal flow voids. Skull and upper cervical spine: Normal marrow signal. Sinuses/Orbits: Bilateral mastoid effusion, right greater than. Paranasal sinuses are clear. Orbits are maintained. Other: None. IMPRESSION: 1. Interval increase in the size of all but 1 brain lesions, as described above, consistent with disease progression. 2. There is evidence of leptomeningeal spread associated with a left frontal and parietal lesions, and a right frontal lesion also appear to extend to the level of the leptomeningeal surface. 3. Significant increase in the confluent T2 hyperintensity within the white matter of the bilateral frontoparietal lobes,  left greater than right, which could represent a combination of vasogenic edema and posttreatment changes. There is also increase in the T2 hyperintensity within the left bilateral cerebellar hemisphere, and less evident on the right. 4. Bilateral mastoid effusion, right greater than left. Electronically Signed   By: Baldemar Lenis M.D.   On: 07/07/2022 12:25   CT CHEST ABDOMEN PELVIS W CONTRAST  Result Date: 07/05/2022 CLINICAL DATA:  Metastatic breast cancer restaging * Tracking Code: BO * EXAM: CT CHEST, ABDOMEN, AND PELVIS WITH CONTRAST TECHNIQUE: Multidetector CT imaging of the chest, abdomen and pelvis was performed following the standard protocol during bolus administration of intravenous contrast. RADIATION DOSE REDUCTION: This exam was performed according to the departmental dose-optimization program which includes automated exposure control, adjustment of the mA and/or kV according to patient size and/or use of iterative reconstruction technique. CONTRAST:  OMNIPAQUE IOHEXOL 300 MG/ML  SOLN COMPARISON:  05/05/2022 FINDINGS: CT CHEST FINDINGS Cardiovascular: Right chest port catheter. Normal heart size. No pericardial effusion. Mediastinum/Nodes: No enlarged mediastinal, hilar, or axillary lymph nodes. Thyroid gland, trachea, and esophagus demonstrate no significant findings. Lungs/Pleura: Mosaic attenuation of the airspaces suggesting small airways disease. Unchanged 0.4 cm nodule of the deep left costophrenic recess (series 4, image 70). No pleural effusion or pneumothorax. Musculoskeletal: No chest wall abnormality. No acute osseous findings. CT ABDOMEN PELVIS FINDINGS Hepatobiliary: No solid liver abnormality is seen. Unchanged focal fatty deposition adjacent to the falciform ligament (series 2, image  43). No gallstones, gallbladder wall thickening, or biliary dilatation. Pancreas: Unremarkable. No pancreatic ductal dilatation or surrounding inflammatory changes. Spleen: Normal  in size without significant abnormality. Adrenals/Urinary Tract: Adrenal glands are unremarkable. Kidneys are normal, without renal calculi, solid lesion, or hydronephrosis. Bladder is unremarkable. Stomach/Bowel: Stomach is within normal limits. Appendix appears normal. No evidence of bowel wall thickening, distention, or inflammatory changes. Moderate burden of stool and stool balls throughout the colon. Vascular/Lymphatic: No significant vascular findings are present. No enlarged abdominal or pelvic lymph nodes. Reproductive: No mass or other abnormality. Other: No abdominal wall hernia or abnormality. No ascites. Musculoskeletal: No acute osseous findings. Unchanged sclerotic osseous metastatic disease including vertebral wedge deformities status post vertebral cement augmentation of T6, L1, and L2 (series 6, image 74). Additional cement augmentation of the right sacrum and ilium (series 2, image 80), and intramedullary rod fixation of the right femur. IMPRESSION: 1. Unchanged sclerotic osseous metastatic disease including vertebral wedge deformities status post vertebral cement augmentation of T6, L1, and L2. Additional cement augmentation of the right sacrum and ilium, and intramedullary rod fixation of the right femur. 2. No evidence of lymphadenopathy or soft tissue metastatic disease in the chest, abdomen, or pelvis. 3. Unchanged 0.4 cm nodule of the deep left costophrenic recess, most likely benign and incidental. Continued attention on follow-up. 4. Mosaic attenuation of the airspaces suggesting small airways disease. Electronically Signed   By: Jearld Lesch M.D.   On: 07/05/2022 22:39   CT Head Wo Contrast  Result Date: 06/12/2022 CLINICAL DATA:  Posttraumatic headache EXAM: CT HEAD WITHOUT CONTRAST TECHNIQUE: Contiguous axial images were obtained from the base of the skull through the vertex without intravenous contrast. RADIATION DOSE REDUCTION: This exam was performed according to the  departmental dose-optimization program which includes automated exposure control, adjustment of the mA and/or kV according to patient size and/or use of iterative reconstruction technique. COMPARISON:  MRI head 05/07/2022.  CT head 02/15/2022. FINDINGS: Brain: There is new white matter edema throughout the left frontal lobe and frontal parietal region. Cortical calcifications in the bilateral frontal lobes minimally increased when compared to the prior study. There is no significant midline shift or mass effect. Ventricles are normal in size. No extra-axial fluid collection identified. Vascular: No hyperdense vessel or unexpected calcification. Skull: Normal. Negative for fracture or focal lesion. Sinuses/Orbits: No acute finding. Other: None. IMPRESSION: 1. New white matter edema throughout the left frontal lobe and frontal parietal region worrisome for vasogenic edema. Findings are concerning for metastatic disease. 2. No acute hemorrhage. Electronically Signed   By: Darliss Cheney M.D.   On: 06/12/2022 16:10   CT Cervical Spine Wo Contrast  Result Date: 06/12/2022 CLINICAL DATA:  Trauma EXAM: CT CERVICAL SPINE WITHOUT CONTRAST TECHNIQUE: Multidetector CT imaging of the cervical spine was performed without intravenous contrast. Multiplanar CT image reconstructions were also generated. RADIATION DOSE REDUCTION: This exam was performed according to the departmental dose-optimization program which includes automated exposure control, adjustment of the mA and/or kV according to patient size and/or use of iterative reconstruction technique. COMPARISON:  PET CT 08/27/19 FINDINGS: Alignment: There is straightening of the normal cervical lordosis Skull base and vertebrae: No acute fracture. There is a sclerotic lesion at the posterior aspect of the C5 vertebral body (series 6, image 33) which is favored to represent a treated metastatic lesion given findings on prior PET-CT dated 08/27/2019. There is an additional lucent  lesion in the C2 vertebral body (series 6, image 33) with a small central region  of sclerosis, this is also favored to represent a metastatic lesion, likely treated. No definite evidence of a new lytic lesion in the cervical spine Soft tissues and spinal canal: No prevertebral fluid or swelling. No visible canal hematoma. Disc levels:  No evidence of high-grade spinal canal stenosis. Upper chest: Partially visualized right chest port in place. Visualized lung apices are clear. Other: None. IMPRESSION: 1. No acute cervical spine fracture. 2. Sclerotic lesion at the posterior aspect of the C5 vertebral body, favored to represent a treated metastatic lesion given findings on prior PET-CT dated 08/27/2019. Additional lucent lesion in the C2 vertebral body with a small central region of sclerosis, also favored to represent a treated metastatic lesion. No definite evidence of a new lytic lesion in the cervical spine. Electronically Signed   By: Lorenza Cambridge M.D.   On: 06/12/2022 15:59     Assessment and plan- Patient is a 42 y.o. 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  She is here to discuss CT scan results and further management  Patient has been on Tucatinib Xeloda Herceptin regimen since June 2023.  Her prior scan had shown new uptake in the distal aspect of the left femur concerning for progression on bone scan.  She did not have any other evidence of systemic progression.  Presently those findings appear stable and she does not have any evidence of visceral disease.  MRI brain in February 2024 had shown more fullness in the appearance of brain lesions and one lesion had grown by 2 mm.  Her present MRI shows that 11 of her 12 lesions have grown by a few millimeters and there is now concern for leptomeningeal spread as well consistent with disease progression.  Although she has done well extracranially in terms of disease control her main site  of disease progression has been in the brain.  I therefore would like to switch her to Enhertu IV every 3 weeks until progression or toxicity.Durable responses have been noted in patients with HER2 positive breast cancer and leptomeningeal metastases treated with Enhertu with an overall response rate of 67%.  However these patients did not have leptomeningeal metastases but rather had stable brain mets at the time of enrollment.  There are small retrospective studies which have done which have shown modest responses with Enhertu.  Discussed risks and benefits of treatment including all but not limited to nausea vomiting low blood counts, skin rash and diarrhea.  All this was explained through official Spanish interpreter.  Treatment will be given with a palliative intent  However with the presence of leptomeningeal disease and lack of adequate disease control in the brain her overall prognosis is poor likely less than 6 months.  I have encouraged her to think about end-of-life goals and discuss this with her children.  She remains hopeful that her change in treatment will work for her.  Patient does not have any insurance and we need to be able to get the drug for compassionate use.  I am hoping to get started with treatment in about 1 to 2 weeks time.  I am also awaiting input from Dr. Dayna Barker at [facility] who will get in touch with radiation oncology to see if there are any other radiation treatment options for her.   Visit Diagnosis 1. Multiple pathological fractures, initial encounter   2. Primary malignant neoplasm of breast with metastasis      Dr. Owens Shark, MD, MPH [facility]  at [facility] [phone] 07/11/2022 12:45 PM

## 2022-07-11 NOTE — Progress Notes (Signed)
DISCONTINUE OFF PATHWAY REGIMEN - Breast   OFF12664:Fam-trastuzumab deruxtecan-nxki 5.4 mg/kg IV D1 q21 Days:   A cycle is every 21 days:     Fam-trastuzumab deruxtecan-nxki   **Always confirm dose/schedule in your pharmacy ordering system**  REASON: Disease Progression PRIOR TREATMENT: Off Pathway: Fam-trastuzumab deruxtecan-nxki 5.4 mg/kg IV D1 q21 Days TREATMENT RESPONSE: Progressive Disease (PD)  START OFF PATHWAY REGIMEN - Breast   [id number]:Fam-trastuzumab deruxtecan-nxki 5.4 mg/kg IV D1 q21 Days:   A cycle is every 21 days:     Fam-trastuzumab deruxtecan-nxki   **Always confirm dose/schedule in your pharmacy ordering system**  Patient Characteristics: Distant Metastases or Locoregional Recurrent Disease - Unresected, M0 or Locally Advanced Unresectable Disease Progressing after Neoadjuvant and Local Therapies, M0, HER2 Positive, ER Negative, Chemotherapy, Third Line Therapeutic Status: Distant Metastases HER2 Status: Positive (+) ER Status: Negative (-) PR Status: Negative (-) Line of Therapy: Third Line Intent of Therapy: Non-Curative / Palliative Intent, Discussed with Patient

## 2022-07-11 NOTE — Progress Notes (Signed)
Patient states that she is tired sometimes.

## 2022-07-12 ENCOUNTER — Other Ambulatory Visit: Payer: Self-pay

## 2022-07-13 ENCOUNTER — Other Ambulatory Visit: Payer: Self-pay

## 2022-07-13 VITALS — HR 66 | Temp 97.3°F | Wt 123.0 lb

## 2022-07-13 DIAGNOSIS — Z515 Encounter for palliative care: Secondary | ICD-10-CM

## 2022-07-13 NOTE — Progress Notes (Signed)
PATIENT NAME: Sophia [name] DOB: 1980-07-04 MRN: [id number]  PRIMARY CARE PROVIDER: Creig Hines, MD  RESPONSIBLE PARTY:  Acct ID - Guarantor Home Phone Work Phone Relationship Acct Type  [id number] Ginnie Smart [facility]* [phone]  Self P/F     [address] [place], [place] [address]   Home visit completed with patient and [place], SW.  ACP:   Needs ongoing discussion.  Currently a full code and desires to keep this status.  Reviewed MOST form and blank copy left in the home.  Will have Spanish interpreter come to next visit with SW to further discuss.  Malignant Neoplasm of the breast with metastasis:  Treatment is limited and will be palliative for patient.  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  On gabapentin to manage.  Patient has pain to her hip and is currently taking hydromorphone.  Social: Very complicated situation for patient.   She has 3 children ages 81,16, and 40.  She is very concerned for their well-being given the her medical condition.  Patient is understanding treatment is limited.  She has begun conversations with her children and are hopeful they can obtain passports to return to [place].  Her mother was only able to stay for 3 weeks and has since returned to [place].  Patient is trying to get her sister to the [place] to help with her care but this has also been challenging.     CODE STATUS: Full ADVANCED DIRECTIVES: No MOST FORM: No PPS: 40%   PHYSICAL EXAM:   VITALS: Today's Vitals   07/13/22 1023  Pulse: 66  Temp: (!) 97.3 F (36.3 C)  SpO2: 99%  Weight: 123 lb (55.8 kg)    LUNGS: clear to auscultation  CARDIAC: Cor RRR}  SKIN: Skin color, texture, turgor normal. No rashes or lesions or mobility and turgor normal  NEURO: positive for weakness       Truitt Merle, RN
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
## 2022-07-14 ENCOUNTER — Ambulatory Visit: Payer: Self-pay

## 2022-07-14 ENCOUNTER — Other Ambulatory Visit: Payer: Self-pay

## 2022-07-14 ENCOUNTER — Ambulatory Visit: Payer: Self-pay | Admitting: Oncology

## 2022-07-17 ENCOUNTER — Other Ambulatory Visit: Payer: Self-pay | Admitting: Oncology

## 2022-07-17 DIAGNOSIS — M8440XA Pathological fracture, unspecified site, initial encounter for fracture: Secondary | ICD-10-CM

## 2022-07-17 DIAGNOSIS — C50919 Malignant neoplasm of unspecified site of unspecified female breast: Secondary | ICD-10-CM

## 2022-07-18 ENCOUNTER — Encounter: Payer: Self-pay | Admitting: Licensed Clinical Social Worker

## 2022-07-18 NOTE — Progress Notes (Signed)
[facility] CSW Progress Note  Clinical Child psychotherapist contacted patient by phone to update her on financial assistance. No available resources for the type of financial assistance she is requesting.  CSW state I will continue to inquire from different organizations.  Patient verbalized understanding.    Joseph Art, LCSW Clinical Social Worker [name]

## 2022-07-20 ENCOUNTER — Other Ambulatory Visit: Payer: Self-pay

## 2022-07-20 ENCOUNTER — Encounter: Payer: Self-pay | Admitting: Oncology

## 2022-07-20 DIAGNOSIS — Z91199 Patient's noncompliance with other medical treatment and regimen due to unspecified reason: Secondary | ICD-10-CM

## 2022-07-20 MED FILL — Dexamethasone Sodium Phosphate Inj 100 MG/10ML: INTRAMUSCULAR | Qty: 1 | Status: AC

## 2022-07-20 MED FILL — Fosaprepitant Dimeglumine For IV Infusion 150 MG (Base Eq): INTRAVENOUS | Qty: 5 | Status: AC

## 2022-07-21 ENCOUNTER — Other Ambulatory Visit: Payer: Self-pay | Admitting: *Deleted

## 2022-07-21 ENCOUNTER — Inpatient Hospital Stay: Payer: Self-pay

## 2022-07-21 ENCOUNTER — Inpatient Hospital Stay: Payer: Self-pay | Attending: Oncology

## 2022-07-21 VITALS — BP 100/69 | HR 68 | Temp 98.0°F | Wt 124.0 lb

## 2022-07-21 DIAGNOSIS — C50919 Malignant neoplasm of unspecified site of unspecified female breast: Secondary | ICD-10-CM

## 2022-07-21 DIAGNOSIS — G893 Neoplasm related pain (acute) (chronic): Secondary | ICD-10-CM | POA: Insufficient documentation

## 2022-07-21 DIAGNOSIS — C7951 Secondary malignant neoplasm of bone: Secondary | ICD-10-CM | POA: Insufficient documentation

## 2022-07-21 DIAGNOSIS — M79604 Pain in right leg: Secondary | ICD-10-CM

## 2022-07-21 DIAGNOSIS — C7931 Secondary malignant neoplasm of brain: Secondary | ICD-10-CM | POA: Insufficient documentation

## 2022-07-21 DIAGNOSIS — Z171 Estrogen receptor negative status [ER-]: Secondary | ICD-10-CM | POA: Insufficient documentation

## 2022-07-21 DIAGNOSIS — C50912 Malignant neoplasm of unspecified site of left female breast: Secondary | ICD-10-CM | POA: Insufficient documentation

## 2022-07-21 DIAGNOSIS — Z5112 Encounter for antineoplastic immunotherapy: Secondary | ICD-10-CM | POA: Insufficient documentation

## 2022-07-21 DIAGNOSIS — M8440XA Pathological fracture, unspecified site, initial encounter for fracture: Secondary | ICD-10-CM

## 2022-07-21 LAB — CBC WITH DIFFERENTIAL (CANCER CENTER ONLY)
Abs Immature Granulocytes: 0.02 10*3/uL (ref 0.00–0.07)
Basophils Absolute: 0 10*3/uL (ref 0.0–0.1)
Basophils Relative: 0 %
Eosinophils Absolute: 0.1 10*3/uL (ref 0.0–0.5)
Eosinophils Relative: 2 %
HCT: 37.8 % (ref 36.0–46.0)
Hemoglobin: 12.2 g/dL (ref 12.0–15.0)
Immature Granulocytes: 0 %
Lymphocytes Relative: 26 %
Lymphs Abs: 1.2 10*3/uL (ref 0.7–4.0)
MCH: 27.7 pg (ref 26.0–34.0)
MCHC: 32.3 g/dL (ref 30.0–36.0)
MCV: 85.7 fL (ref 80.0–100.0)
Monocytes Absolute: 0.4 10*3/uL (ref 0.1–1.0)
Monocytes Relative: 8 %
Neutro Abs: 3 10*3/uL (ref 1.7–7.7)
Neutrophils Relative %: 64 %
Platelet Count: 299 10*3/uL (ref 150–400)
RBC: 4.41 MIL/uL (ref 3.87–5.11)
RDW: 14 % (ref 11.5–15.5)
WBC Count: 4.7 10*3/uL (ref 4.0–10.5)
nRBC: 0 % (ref 0.0–0.2)

## 2022-07-21 LAB — CMP (CANCER CENTER ONLY)
ALT: 10 U/L (ref 0–44)
AST: 27 U/L (ref 15–41)
Albumin: 3.8 g/dL (ref 3.5–5.0)
Alkaline Phosphatase: 72 U/L (ref 38–126)
Anion gap: 7 (ref 5–15)
BUN: 9 mg/dL (ref 6–20)
CO2: 26 mmol/L (ref 22–32)
Calcium: 8.7 mg/dL — ABNORMAL LOW (ref 8.9–10.3)
Chloride: 103 mmol/L (ref 98–111)
Creatinine: 0.54 mg/dL (ref 0.44–1.00)
GFR, Estimated: >60 mL/min (ref >60)
Glucose, Bld: 81 mg/dL (ref 70–99)
Potassium: 3.6 mmol/L (ref 3.5–5.1)
Sodium: 136 mmol/L (ref 135–145)
Total Bilirubin: 0.5 mg/dL (ref 0.3–1.2)
Total Protein: 7.5 g/dL (ref 6.5–8.1)

## 2022-07-21 LAB — PREGNANCY, URINE: Preg Test, Ur: NEGATIVE

## 2022-07-21 MED ORDER — FAM-TRASTUZUMAB DERUXTECAN-NXKI CHEMO 100 MG IV SOLR
5.4000 mg/kg | Freq: Once | INTRAVENOUS | Status: AC
  Administered 2022-07-21: 300 mg via INTRAVENOUS
  Filled 2022-07-21: qty 15

## 2022-07-21 MED ORDER — DEXTROSE 5 % IV SOLN
Freq: Once | INTRAVENOUS | Status: AC
  Filled 2022-07-21: qty 250

## 2022-07-21 MED ORDER — PALONOSETRON HCL INJECTION 0.25 MG/5ML
0.2500 mg | Freq: Once | INTRAVENOUS | Status: AC
  Administered 2022-07-21: 0.25 mg via INTRAVENOUS
  Filled 2022-07-21: qty 5

## 2022-07-21 MED ORDER — HEPARIN SOD (PORK) LOCK FLUSH 100 UNIT/ML IV SOLN
INTRAVENOUS | Status: AC
  Filled 2022-07-21: qty 5

## 2022-07-21 MED ORDER — SODIUM CHLORIDE 0.9 % IV SOLN
10.0000 mg | Freq: Once | INTRAVENOUS | Status: AC
  Administered 2022-07-21: 10 mg via INTRAVENOUS
  Filled 2022-07-21: qty 10

## 2022-07-21 MED ORDER — MORPHINE SULFATE (PF) 2 MG/ML IV SOLN
4.0000 mg | Freq: Once | INTRAVENOUS | Status: AC
  Administered 2022-07-21: 4 mg via INTRAVENOUS
  Filled 2022-07-21: qty 2

## 2022-07-21 MED ORDER — SODIUM CHLORIDE 0.9 % IV SOLN
150.0000 mg | Freq: Once | INTRAVENOUS | Status: AC
  Administered 2022-07-21: 150 mg via INTRAVENOUS
  Filled 2022-07-21: qty 150

## 2022-07-21 MED ORDER — ACETAMINOPHEN 325 MG PO TABS
650.0000 mg | ORAL_TABLET | Freq: Once | ORAL | Status: AC
  Administered 2022-07-21: 650 mg via ORAL
  Filled 2022-07-21: qty 2

## 2022-07-21 MED ORDER — DIPHENHYDRAMINE HCL 25 MG PO CAPS
50.0000 mg | ORAL_CAPSULE | Freq: Once | ORAL | Status: AC
  Administered 2022-07-21: 50 mg via ORAL
  Filled 2022-07-21: qty 2

## 2022-07-21 NOTE — Patient Instructions (Signed)
Instrucciones al darle de alta: Discharge Instructions Gracias por elegir al [facility] de Cncer de [place] para brindarle atencin mdica de oncologa y Teacher, English as a foreign language.   Si usted tiene una cita de laboratorio con [employer] de [place], por favor vaya directamente al [employer] de Cncer y regstrese en el rea de Engineer, maintenance (IT).   Use ropa cmoda y [place] para tener fcil acceso a las vas del Portacath (acceso venoso de Set designer duracin) o la lnea PICC (catter central colocado por va perifrica).   Nos esforzamos por ofrecerle tiempo de calidad con su proveedor. Es posible que tenga que volver a programar su cita si llega tarde (15 minutos o ms).  El llegar tarde le afecta a usted y a otros pacientes cuyas citas son posteriores a Armed forces operational officer.  Adems, si usted falta a tres o ms citas sin avisar a la oficina, puede ser retirado(a) de la clnica a discrecin del proveedor.      Para las solicitudes de renovacin de recetas, pida a su farmacia que se ponga en contacto con nuestra oficina y deje que transcurran 72 horas para que se complete el proceso de las renovaciones.    Hoy usted recibi los siguientes agentes de quimioterapia e/o inmunoterapia enhertu    Para ayudar a prevenir las nuseas y los vmitos despus de su tratamiento, le recomendamos que tome su medicamento para las nuseas segn las indicaciones.  LOS SNTOMAS QUE DEBEN COMUNICARSE INMEDIATAMENTE SE INDICAN A CONTINUACIN: *FIEBRE SUPERIOR A 100.4 F (38 C) O MS *ESCALOFROS O SUDORACIN *NUSEAS Y VMITOS QUE NO SE CONTROLAN CON EL MEDICAMENTO PARA LAS NUSEAS *DIFICULTAD INUSUAL PARA RESPIRAR  *MORETONES O HEMORRAGIAS NO HABITUALES *PROBLEMAS URINARIOS (dolor o ardor al Geographical information systems officer o frecuencia para Geographical information systems officer) *PROBLEMAS INTESTINALES (diarrea inusual, estreimiento, dolor cerca del ano) SENSIBILIDAD EN LA BOCA Y EN LA GARGANTA CON O SIN LA PRESENCIA DE LCERAS (dolor de garganta, llagas en la boca o dolor de muelas/dientes) ERUPCIN,  HINCHAZN O DOLORES INUSUALES FLUJO VAGINAL INUSUAL O PICAZN/RASQUIA    Los puntos marcados con un asterisco ( *) indican una posible emergencia y debe hacer un seguimiento tan pronto como le sea posible o vaya al Departamento de Emergencias si se le presenta algn problema.  Por favor, muestre la [place] DE ADVERTENCIA DE Marc Morgans DE ADVERTENCIA DE Gardiner Fanti al registrarse en [address] de Emergencias y a la enfermera de triaje.  Si tiene preguntas despus de su visita o necesita cancelar o volver a programar su cita, por favor pngase en contacto con [facility]  [phone]  y siga las instrucciones. Las horas de oficina son de 8:00 a.m. a 4:30 p.m. de lunes a viernes. Por favor, tenga en cuenta que los mensajes de voz que se dejan despus de las 4:00 p.m. posiblemente no se devolvern hasta el siguiente da de [place].  Cerramos los fines de semana y [employer]. En todo momento tiene acceso a una enfermera para preguntas urgentes. Por favor, llame al nmero principal de la clnica  [phone] y siga las instrucciones.   Para cualquier pregunta que no sea de carcter urgente, tambin puede ponerse en contacto con su proveedor [employer] Lilly and Company. Ahora ofrecemos visitas electrnicas para cualquier persona mayor de 18 aos que solicite atencin mdica en lnea para los sntomas que no sean urgentes. Para ms detalles vaya a [email or web address].   Tambin puede bajar la aplicacin de MyChart! Vaya a la tienda de aplicaciones, busque "MyChart", abra la aplicacin, seleccione [facility], e ingrese con su nombre de usuario y la contrasea de Clinical cytogeneticist.   Fam-Trastuzumab Deruxtecan Injection Qu es este medicamento? El FAM-TRASTUZUMAB DERUXTECAN trata algunos tipos de cncer. Acta bloqueando una protena que hace que las clulas cancerosas crezcan y se multipliquen. Esto ayuda a Neurosurgeon  propagacin de las clulas cancerosas. Este medicamento puede ser utilizado para otros usos; si tiene alguna pregunta consulte con su proveedor de atencin mdica o con su farmacutico. MARCAS COMUNES: ENHERTU Qu le debo informar a mi profesional de la salud antes de tomar este medicamento? Necesitan saber si usted presenta alguno de los [employer] o situaciones: Enfermedad cardiaca Insuficiencia cardiaca Infeccin, especialmente infecciones virales, tales como varicela, fuegos labiales o herpes Enfermedad heptica Enfermedad pulmonar o respiratoria, tales como asma o EPOC Una reaccin alrgica o inusual al fam-trastuzumab deruxtecan, a otros medicamentos, alimentos, colorantes o conservantes Si est embarazada o buscando quedar embarazada Si est amamantando a un beb Cmo debo utilizar este medicamento? Este medicamento se inyecta en una vena. Su equipo de atencin lo [place] en un hospital o en un entorno clnico. Se le entregar una Gua del medicamento (MedGuide, su nombre en ingls) especial antes de cada tratamiento. Asegrese de leer esta informacin cada vez cuidadosamente. Hable con su equipo de atencin sobre el uso de este medicamento en nios. Puede requerir atencin especial. Sobredosis: Pngase en contacto inmediatamente con un centro toxicolgico o una sala de urgencia si usted cree que haya tomado demasiado medicamento. ATENCIN: [employer] es solo para usted. No comparta este medicamento con nadie. Qu sucede si me olvido de una dosis? Es importante no olvidar ninguna dosis. Llame a su equipo de atencin si no puede asistir a una cita. Qu puede interactuar con este medicamento? No se anticipan interacciones. Puede ser que esta lista no menciona todas las posibles interacciones. Informe a su profesional de [employer] de [employer] productos a base de hierbas, medicamentos de [place] o suplementos nutritivos que est tomando. Si usted fuma, consume  bebidas alcohlicas o si utiliza drogas ilegales, indqueselo tambin a su profesional de [employer]. Algunas sustancias pueden interactuar con su medicamento. A qu debo estar atento al usar PPL Corporation? Visite a su equipo de atencin para que revise su evolucin peridicamente. Si los sntomas no comienzan a mejorar o si empeoran, consulte con su equipo de atencin. Se supervisar su estado de salud atentamente mientras reciba este medicamento. No debe quedar embarazada mientras est usando este medicamento o por 7 meses despus de dejar de usarlo. Las mujeres deben informar a su equipo de atencin si estn buscando quedar embarazadas o si creen que podran estar embarazadas. Los hombres no deben Media planner a Careers information officer estn recibiendo [employer] Corporation y [place] 4 meses despus de dejar de usarlo. Existe la posibilidad de que ocurran efectos secundarios graves en un beb sin nacer. Para obtener ms informacin, hable con su equipo de atencin. No debe amamantar a un beb mientras est usando este medicamento o durante 7 meses despus de la ltima dosis. Este medicamento ha causado recuentos de esperma reducidos en algunos hombres. Esto puede hacer ms difcil que un hombre embarace a Nurse, mental health. Hable con su equipo de atencin si le preocupa su fertilidad. Este medicamento podra aumentar el riesgo de moretones o sangrado. Llame a su equipo de atencin si observa sangrados inusuales. Proceda con cuidado al cepillar sus dientes, usar hilo dental o Chemical engineer palillos para los dientes, ya que podra contraer una infeccin o Geophysicist/field seismologist con  mayor facilidad. Si recibe algn tratamiento dental, informe a su dentista que est [employer]. Este medicamento puede resecarle los ojos y provocar visin borrosa. Si [place] lentes de contacto, puede sentir ciertas molestias. Las gotas lubricantes para los ojos pueden ser tiles. Si el problema no desaparece o es grave, consulte a su equipo de  atencin. Este medicamento puede aumentar su riesgo de contraer una infeccin. Llame a su equipo de atencin si tiene fiebre, escalofros, dolor de garganta o cualquier otro sntoma de resfriado o gripe. No se trate usted mismo. Trate de no acercarse a personas que estn enfermas. Evite usar [employer] contienen aspirina, acetaminofeno, ibuprofeno, naproxeno o ketoprofeno, a menos que as lo indique su equipo de atencin. Estos medicamentos pueden ocultar la fiebre. Qu efectos secundarios puedo tener al [employer] este medicamento? Efectos secundarios que debe informar a su equipo de atencin tan pronto como sea posible: Reacciones alrgicas: erupcin cutnea, comezn/picazn, urticaria, hinchazn de la cara, los labios, la lengua o la garganta Tos seca, falta de aire o problemas para respirar Infeccin: fiebre, escalofros, tos, dolor de garganta, heridas que no sanan, dolor o problemas para Geographical information systems officer, sensacin general de molestia o Sports administrator cardiaca: falta de aire, hinchazn de los tobillos, los pies o las [place], aumento de peso repentino, debilidad o fatiga inusuales Sangrado o moretones inusuales Efectos secundarios que generalmente no requieren atencin mdica (debe informarlos a su equipo de atencin si persisten o si son molestos): Estreimiento Diarrea Cada del cabello Dolor muscular Nuseas Vmito Puede ser que esta lista no menciona todos los posibles efectos secundarios. Comunquese a su mdico por asesoramiento mdico [employer]. Usted puede informar los efectos secundarios a la FDA por telfono al 1-800-FDA-1088. Dnde debo guardar mi medicina? Este medicamento se administra en hospitales o clnicas. No se guarda en su casa. ATENCIN: Este folleto es un resumen. Puede ser que no cubra toda la posible informacin. Si usted tiene preguntas acerca de esta medicina, consulte con su mdico, su farmacutico o su profesional de Radiographer, therapeutic.  2023  Elsevier/Gold Standard (2021-01-26 00:00:00)

## 2022-07-21 NOTE — Progress Notes (Deleted)
Last urine pregnancy negative on 06/23/2022. Per MD to proceed with urine pregnancy from 06/23/22 for todays treatment.   Established Patient Office Visit  Subjective   Patient ID: Sophia [name], female    DOB: December 18, 1980  Age: 42 y.o. MRN: [id number]  No chief complaint on file.   HPI  {History (Optional):23778}  ROS    Objective:     BP 100/69 (BP Location: Left Arm)   Pulse 68   Temp 98 F (36.7 C) (Oral)   Wt 124 lb 0.1 oz (56.3 kg)   SpO2 100%   BMI 25.05 kg/m  {Vitals History (Optional):23777}  Physical Exam   Results for orders placed or performed in visit on 07/21/22  CMP (Cancer Center only)  Result Value Ref Range   Sodium 136 135 - 145 mmol/L   Potassium 3.6 3.5 - 5.1 mmol/L   Chloride 103 98 - 111 mmol/L   CO2 26 22 - 32 mmol/L   Glucose, Bld 81 70 - 99 mg/dL   BUN 9 6 - 20 mg/dL   Creatinine 4.09 8.11 - 1.00 mg/dL   Calcium 8.7 (L) 8.9 - 10.3 mg/dL   Total Protein 7.5 6.5 - 8.1 g/dL   Albumin 3.8 3.5 - 5.0 g/dL   AST 27 15 - 41 U/L   ALT 10 0 - 44 U/L   Alkaline Phosphatase 72 38 - 126 U/L   Total Bilirubin 0.5 0.3 - 1.2 mg/dL   GFR, Estimated >91 >47 mL/min   Anion gap 7 5 - 15  CBC with Differential (Cancer Center Only)  Result Value Ref Range   WBC Count 4.7 4.0 - 10.5 K/uL   RBC 4.41 3.87 - 5.11 MIL/uL   Hemoglobin 12.2 12.0 - 15.0 g/dL   HCT 82.9 56.2 - 13.0 %   MCV 85.7 80.0 - 100.0 fL   MCH 27.7 26.0 - 34.0 pg   MCHC 32.3 30.0 - 36.0 g/dL   RDW 86.5 78.4 - 69.6 %   Platelet Count 299 150 - 400 K/uL   nRBC 0.0 0.0 - 0.2 %   Neutrophils Relative % 64 %   Neutro Abs 3.0 1.7 - 7.7 K/uL   Lymphocytes Relative 26 %   Lymphs Abs 1.2 0.7 - 4.0 K/uL   Monocytes Relative 8 %   Monocytes Absolute 0.4 0.1 - 1.0 K/uL   Eosinophils Relative 2 %   Eosinophils Absolute 0.1 0.0 - 0.5 K/uL   Basophils Relative 0 %   Basophils Absolute 0.0 0.0 - 0.1 K/uL   Immature Granulocytes 0 %   Abs Immature Granulocytes 0.02 0.00 - 0.07 K/uL     {Labs (Optional):23779}  The ASCVD Risk score (Arnett DK, et al., 2019) failed to calculate for the following reasons:   Cannot find a previous HDL lab   Cannot find a previous total cholesterol lab    Assessment & Plan:   Problem List Items Addressed This Visit       Musculoskeletal and Integument   Multiple pathological fractures   Relevant Medications   fam-trastuzumab deruxtecan-nxki (ENHERTU) 300 mg in dextrose 5 % 100 mL chemo infusion   Other Relevant Orders   Pregnancy, urine     Other   Primary malignant neoplasm of breast with metastasis (HCC) - Primary   Relevant Medications   fam-trastuzumab deruxtecan-nxki (ENHERTU) 300 mg in dextrose 5 % 100 mL chemo infusion   Other Relevant Orders   Pregnancy, urine    No follow-ups on file.  Sharyon Medicus, RN

## 2022-07-21 NOTE — Progress Notes (Signed)
Patient was a no-show for home visit.  SW and family liaison, Leia Alf, arrived at patient, knocked and waited outside the home fro 10+ min. SW called patient while at the home and LVM for patient to make aware of missed visit and to reschedule.

## 2022-07-21 NOTE — Progress Notes (Signed)
Last urine pregnancy test done on 06/23/2022 and was negative. Okay per Dr Smith Robert to continue with treatment today with 06/23/2022 negative test.    Interpreter present. Pt expressed to RN that she is having urinary complications  such as burning and itching but is now having yellow vaginal discharge. Pt also expressed to RN that she having right leg pain and left wrist/forearm pain. Her pain is at a 9 out 10. MD and treatment team made aware. Per MD to place order for urine culture and pain medication will be entered by treatment team. Pt made aware and all questions answered.   Per orders- urine collected and sent down to the lab at 1430.   Tim Corriher [employer]

## 2022-07-22 LAB — URINE CULTURE: Culture: <10000 — AB

## 2022-07-27 ENCOUNTER — Other Ambulatory Visit: Payer: Self-pay | Admitting: *Deleted

## 2022-07-28 ENCOUNTER — Encounter: Payer: Self-pay | Admitting: Oncology

## 2022-07-28 ENCOUNTER — Telehealth: Payer: Self-pay | Admitting: Hospice and Palliative Medicine

## 2022-07-28 ENCOUNTER — Other Ambulatory Visit: Payer: Self-pay

## 2022-07-28 MED ORDER — HYDROMORPHONE HCL 2 MG PO TABS
1.0000 mg | ORAL_TABLET | ORAL | 0 refills | Status: DC | PRN
  Filled 2022-07-28: qty 60, 10d supply, fill #0

## 2022-07-28 MED ORDER — MORPHINE SULFATE ER 30 MG PO TBCR
30.0000 mg | EXTENDED_RELEASE_TABLET | Freq: Two times a day (BID) | ORAL | 0 refills | Status: DC
  Filled 2022-07-28: qty 60, 30d supply, fill #0

## 2022-08-01 ENCOUNTER — Encounter: Payer: Self-pay | Admitting: Licensed Clinical Social Worker

## 2022-08-01 NOTE — Progress Notes (Signed)
[facility] CSW Progress Note  Clinical Child psychotherapist contacted patient by phone to discuss financial assistance and paperwork readiness.  CSW left applications for patient at front desk.    Joseph Art, LCSW Clinical Social Worker [facility]

## 2022-08-02 ENCOUNTER — Inpatient Hospital Stay (HOSPITAL_BASED_OUTPATIENT_CLINIC_OR_DEPARTMENT_OTHER): Payer: Self-pay | Admitting: Hospice and Palliative Medicine

## 2022-08-02 DIAGNOSIS — C7931 Secondary malignant neoplasm of brain: Secondary | ICD-10-CM

## 2022-08-02 DIAGNOSIS — C50919 Malignant neoplasm of unspecified site of unspecified female breast: Secondary | ICD-10-CM

## 2022-08-02 NOTE — Progress Notes (Signed)
Virtual Visit via Telephone Note  I connected with Sophia [name] on 06/28/22 at  by telephone and verified that I am speaking with the correct person using two identifiers.  Location: Patient: Home Provider: Clinic   I discussed the limitations, risks, security and privacy concerns of performing an evaluation and management service by telephone and the availability of in person appointments. I also discussed with the patient that there may be a patient responsible charge related to this service. The patient expressed understanding and agreed to proceed.   History of Present Illness: Sophia [name] is a 42 y.o. 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 MRI of the brain on 02/08/2021 revealed widespread metastatic disease to the brain with mild associated edema and small area of hemorrhage in the right frontal.  Patient is status post whole brain radiation.  She completed Taxol and was on Herceptin and Perjeta maintenance.  She is now on trastuzumab with Xeloda and tucatinib.    Observations/Objective: Spoke with patient by phone.    He denies any significant changes or concerns today.  Reports that pain is stable.  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  Patient saw [name] recently with plan to rotate to Enhertu.  Understandably, patient reports sadness at the recent news of her cancer progression and seems to understand prognosis is poor.  She does verbalize agreement with current plan of care.  Socially, patient has lack of support.  Her mother had to go back to [place].  Assessment and Plan: Stage IV metastatic breast cancer -recent progression demonstrated on MRI of the brain.  Patient starting Enhertu  Neoplasm related pain -continue MS Contin/hydromorphone  Weakness -continue supportive care  Follow Up Instructions: RTC 2  to 3 weeks  I discussed the assessment and treatment plan with the patient. The patient was provided an opportunity to ask questions and all were answered. The patient agreed with the plan and demonstrated an understanding of the instructions.   The patient was advised to call back or seek an in-person evaluation if the symptoms worsen or if the condition fails to improve as anticipated.  I provided 10 minutes of non-face-to-face time during this encounter.   Malachy Moan, NP

## 2022-08-04 ENCOUNTER — Encounter: Payer: Self-pay | Admitting: Licensed Clinical Social Worker

## 2022-08-04 ENCOUNTER — Other Ambulatory Visit: Payer: Self-pay

## 2022-08-04 NOTE — Progress Notes (Signed)
[facility] CSW Progress Note  Clinical Child psychotherapist contacted patient by phone to discuss financial assistance.  CSW left voicemail for patient and request for return call.Joseph Art, LCSW Clinical Social Worker [facility]

## 2022-08-08 ENCOUNTER — Encounter: Payer: Self-pay | Admitting: Licensed Clinical Social Worker

## 2022-08-08 NOTE — Progress Notes (Signed)
[facility] CSW Progress Note  Clinical Child psychotherapist contacted patient by phone to discuss passport application process.  CSW explained to patient the criteria for passport application, and requested patient make an appointment with local post office for passport photos.  CSW sent application for financial assistance to [employer].    Joseph Art, LCSW Clinical Social Worker [facility]

## 2022-08-09 ENCOUNTER — Ambulatory Visit: Payer: Self-pay | Admitting: Urology

## 2022-08-10 ENCOUNTER — Encounter: Payer: Self-pay | Admitting: Oncology

## 2022-08-10 MED FILL — Dexamethasone Sodium Phosphate Inj 100 MG/10ML: INTRAMUSCULAR | Qty: 1 | Status: AC

## 2022-08-10 MED FILL — Fosaprepitant Dimeglumine For IV Infusion 150 MG (Base Eq): INTRAVENOUS | Qty: 5 | Status: AC

## 2022-08-11 ENCOUNTER — Other Ambulatory Visit: Payer: Self-pay

## 2022-08-11 ENCOUNTER — Inpatient Hospital Stay: Payer: Self-pay

## 2022-08-11 ENCOUNTER — Inpatient Hospital Stay (HOSPITAL_BASED_OUTPATIENT_CLINIC_OR_DEPARTMENT_OTHER): Payer: Self-pay | Admitting: Oncology

## 2022-08-11 ENCOUNTER — Encounter: Payer: Self-pay | Admitting: Oncology

## 2022-08-11 ENCOUNTER — Encounter: Payer: Self-pay | Admitting: Urology

## 2022-08-11 ENCOUNTER — Inpatient Hospital Stay (HOSPITAL_BASED_OUTPATIENT_CLINIC_OR_DEPARTMENT_OTHER): Payer: Self-pay | Admitting: Hospice and Palliative Medicine

## 2022-08-11 VITALS — BP 95/69 | HR 65

## 2022-08-11 VITALS — BP 100/65 | HR 69 | Temp 96.8°F | Resp 18 | Ht 59.0 in | Wt 119.9 lb

## 2022-08-11 DIAGNOSIS — C50919 Malignant neoplasm of unspecified site of unspecified female breast: Secondary | ICD-10-CM

## 2022-08-11 DIAGNOSIS — M8440XA Pathological fracture, unspecified site, initial encounter for fracture: Secondary | ICD-10-CM

## 2022-08-11 DIAGNOSIS — Z515 Encounter for palliative care: Secondary | ICD-10-CM

## 2022-08-11 DIAGNOSIS — R519 Headache, unspecified: Secondary | ICD-10-CM

## 2022-08-11 LAB — CBC WITH DIFFERENTIAL (CANCER CENTER ONLY)
Abs Immature Granulocytes: 0.07 10*3/uL (ref 0.00–0.07)
Basophils Absolute: 0 10*3/uL (ref 0.0–0.1)
Basophils Relative: 1 %
Eosinophils Absolute: 0.1 10*3/uL (ref 0.0–0.5)
Eosinophils Relative: 2 %
HCT: 36.5 % (ref 36.0–46.0)
Hemoglobin: 12 g/dL (ref 12.0–15.0)
Immature Granulocytes: 2 %
Lymphocytes Relative: 36 %
Lymphs Abs: 1.2 10*3/uL (ref 0.7–4.0)
MCH: 27.5 pg (ref 26.0–34.0)
MCHC: 32.9 g/dL (ref 30.0–36.0)
MCV: 83.5 fL (ref 80.0–100.0)
Monocytes Absolute: 0.5 10*3/uL (ref 0.1–1.0)
Monocytes Relative: 15 %
Neutro Abs: 1.5 10*3/uL — ABNORMAL LOW (ref 1.7–7.7)
Neutrophils Relative %: 44 %
Platelet Count: 578 10*3/uL — ABNORMAL HIGH (ref 150–400)
RBC: 4.37 MIL/uL (ref 3.87–5.11)
RDW: 14.3 % (ref 11.5–15.5)
WBC Count: 3.4 10*3/uL — ABNORMAL LOW (ref 4.0–10.5)
nRBC: 0 % (ref 0.0–0.2)

## 2022-08-11 LAB — CMP (CANCER CENTER ONLY)
ALT: 15 U/L (ref 0–44)
AST: 24 U/L (ref 15–41)
Albumin: 3.4 g/dL — ABNORMAL LOW (ref 3.5–5.0)
Alkaline Phosphatase: 73 U/L (ref 38–126)
Anion gap: 8 (ref 5–15)
BUN: 5 mg/dL — ABNORMAL LOW (ref 6–20)
CO2: 24 mmol/L (ref 22–32)
Calcium: 8.2 mg/dL — ABNORMAL LOW (ref 8.9–10.3)
Chloride: 105 mmol/L (ref 98–111)
Creatinine: 0.42 mg/dL — ABNORMAL LOW (ref 0.44–1.00)
GFR, Estimated: >60 mL/min (ref >60)
Glucose, Bld: 90 mg/dL (ref 70–99)
Potassium: 3.4 mmol/L — ABNORMAL LOW (ref 3.5–5.1)
Sodium: 137 mmol/L (ref 135–145)
Total Bilirubin: 0.3 mg/dL (ref 0.3–1.2)
Total Protein: 7.4 g/dL (ref 6.5–8.1)

## 2022-08-11 LAB — PREGNANCY, URINE: Preg Test, Ur: NEGATIVE

## 2022-08-11 MED ORDER — SODIUM CHLORIDE 0.9% FLUSH
10.0000 mL | INTRAVENOUS | Status: DC | PRN
  Administered 2022-08-11: 10 mL via INTRAVENOUS
  Filled 2022-08-11: qty 10

## 2022-08-11 MED ORDER — ACETAMINOPHEN 325 MG PO TABS
650.0000 mg | ORAL_TABLET | Freq: Once | ORAL | Status: AC
  Administered 2022-08-11: 650 mg via ORAL
  Filled 2022-08-11: qty 2

## 2022-08-11 MED ORDER — SODIUM CHLORIDE 0.9 % IV SOLN
10.0000 mg | Freq: Once | INTRAVENOUS | Status: AC
  Administered 2022-08-11: 10 mg via INTRAVENOUS
  Filled 2022-08-11: qty 10

## 2022-08-11 MED ORDER — MORPHINE SULFATE (PF) 2 MG/ML IV SOLN
2.0000 mg | Freq: Once | INTRAVENOUS | Status: AC
  Administered 2022-08-11: 2 mg via INTRAVENOUS
  Filled 2022-08-11: qty 1

## 2022-08-11 MED ORDER — DIPHENHYDRAMINE HCL 25 MG PO CAPS
50.0000 mg | ORAL_CAPSULE | Freq: Once | ORAL | Status: AC
  Administered 2022-08-11: 50 mg via ORAL
  Filled 2022-08-11: qty 2

## 2022-08-11 MED ORDER — DEXTROSE 5 % IV SOLN
Freq: Once | INTRAVENOUS | Status: AC
  Filled 2022-08-11: qty 250

## 2022-08-11 MED ORDER — SODIUM CHLORIDE 0.9 % IV SOLN
150.0000 mg | Freq: Once | INTRAVENOUS | Status: AC
  Administered 2022-08-11: 150 mg via INTRAVENOUS
  Filled 2022-08-11: qty 150

## 2022-08-11 MED ORDER — FAM-TRASTUZUMAB DERUXTECAN-NXKI CHEMO 100 MG IV SOLR
5.4000 mg/kg | Freq: Once | INTRAVENOUS | Status: AC
  Administered 2022-08-11: 300 mg via INTRAVENOUS
  Filled 2022-08-11: qty 15

## 2022-08-11 MED ORDER — HEPARIN SOD (PORK) LOCK FLUSH 100 UNIT/ML IV SOLN
500.0000 [IU] | Freq: Once | INTRAVENOUS | Status: DC | PRN
  Filled 2022-08-11: qty 5

## 2022-08-11 MED ORDER — PALONOSETRON HCL INJECTION 0.25 MG/5ML
0.2500 mg | Freq: Once | INTRAVENOUS | Status: AC
  Administered 2022-08-11: 0.25 mg via INTRAVENOUS
  Filled 2022-08-11: qty 5

## 2022-08-11 NOTE — Progress Notes (Signed)
Hematology/Oncology Consult note [facility]  Telephone:([phone] [phone]  Patient Care Team: Creig Hines, MD as PCP - General (Oncology) Jim Like, RN as Registered Nurse Scarlett Presto, RN (Inactive) as Registered Nurse Creig Hines, MD as Consulting Physician (Hematology and Oncology)   Name of the patient: Sophia [name]  [id number]  [birth year]   Date of visit: 08/11/22  Diagnosis- stage IV metastatic breast cancer ER/PR negative HER-2/neu positive with bone metastases     Chief complaint/ Reason for visit-on treatment assessment prior to cycle 2 of Enhertu  Heme/Onc history: patient is a 42 year old Hispanic female who is here with her friend.  History obtained with the help of an interpreter.Patient self palpated left breast mass which was followed by a diagnostic bilateral mammogram.  Mammogram showed 3.1 x 2.9 x 1.9 cm hypoechoic mass at the 1 o'clock position of the left breast.  For abnormal cortically thickened left axillary lymph nodes measuring up to 5 mm.  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  Patient was also having ongoing back pain and was seen by [facility] orthopedics Dr. Izola Price who ordered MRI lumbar spine without contrast which showed possible pathologic fractures of L1 and L4 vertebral bodies with greater than 50% height loss at L1 and abnormal signal involving L2-L3 S1 as well as right iliac bone concerning for metastatic disease.  Patient is a single mother of 3 adult children and is very anxious today.  She reports significant back pain which radiates to her bilateral thighs.  Denies any focal tingling numbness or weakness.  Denies any bowel bladder incontinence.  Pain has been uncontrolled despite taking Tylenol.  No prior history of abnormal breast biopsies.  No family history of breast  cancer   PET and MRI showed 3 areas of pathologic fracture of her spine as well as widespread bony metastatic disease and concern for impending fracture of the right hip.  Given her worsening pain she was asked to come to the ER.  She has been evaluated by Dr. Rosita Kea from orthopedic surgery and underwent kyphoplasty at 3 different levels. T6 L1 and L4 along with radiofrequency ablation.    She also underwent prophylactic fixation of the right hip and not affected the sacral region.     Patient received first dose of Herceptin and Perjeta on 09/04/2019.  Patient initially received Taxol and Herceptin perjeta followed by maintenance HP.  Patient found to have brain mets in November 2022 for which she received whole brain radiation treatment but there was no evidence of systemic progression.  She was continued on Herceptin and Perjeta.  Repeat MRI in June 2023 showed slight progression of brain mets.  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  Patient switched to third line Enhertu  Interval history-patient has some good days and bad days.  Memory is not as good as it used to be.  She has not had any recent falls.  Oral intake is fair.  Pain is reasonably well-controlled  ECOG PS- 2 Pain scale- 3 Opioid associated constipation- no  Review of systems- Review of Systems  Constitutional:  Positive for malaise/fatigue. Negative for chills, fever and weight loss.  HENT:  Negative for congestion, ear discharge and nosebleeds.   Eyes:  Negative  for blurred vision.  Respiratory:  Negative for cough, hemoptysis, sputum production, shortness of breath and wheezing.   Cardiovascular:  Negative for chest pain, palpitations, orthopnea and claudication.  Gastrointestinal:  Negative for abdominal pain, blood in stool, constipation, diarrhea, heartburn, melena, nausea and vomiting.  Genitourinary:  Negative  for dysuria, flank pain, frequency, hematuria and urgency.  Musculoskeletal:  Negative for back pain, joint pain and myalgias.  Skin:  Negative for rash.  Neurological:  Positive for sensory change (Peripheral neuropathy). Negative for dizziness, tingling, focal weakness, seizures, weakness and headaches.  Endo/Heme/Allergies:  Does not bruise/bleed easily.  Psychiatric/Behavioral:  Negative for depression and suicidal ideas. The patient does not have insomnia.       No Known Allergies   Past Medical History:  Diagnosis Date   Anxiety    Breast cancer (HCC)    with mets   Cancer (HCC)    Colitis    COVID-19 in immunocompromised patient (HCC)    Family history of colon cancer    Vertigo      Past Surgical History:  Procedure Laterality Date   BREAST BIOPSY Left 08/14/2019   [place] bx of mass, coil marker, path pending   BREAST BIOPSY Left 08/14/2019   [place] bx of LN, hydromarker, path pending   BREAST BIOPSY Left 08/14/2019   affirm bx of calcs, x marker, path pending   ESOPHAGOGASTRODUODENOSCOPY (EGD) WITH PROPOFOL N/A 10/05/2019   Procedure: ESOPHAGOGASTRODUODENOSCOPY (EGD) WITH PROPOFOL;  Surgeon: Toney Reil, MD;  Location: [facility] ENDOSCOPY;  Service: Gastroenterology;  Laterality: N/A;   FLEXIBLE SIGMOIDOSCOPY N/A 10/05/2019   Procedure: FLEXIBLE SIGMOIDOSCOPY;  Surgeon: Toney Reil, MD;  Location: [facility] ENDOSCOPY;  Service: Gastroenterology;  Laterality: N/A;   INTRAMEDULLARY (IM) NAIL INTERTROCHANTERIC Right 09/01/2019   Procedure: INTRAMEDULLARY (IM) NAIL INTERTROCHANTRIC AND RADIOFREQUENCY ABLATION;  Surgeon: Kennedy Bucker, MD;  Location: [facility] ORS;  Service: Orthopedics;  Laterality: Right;   KYPHOPLASTY N/A 08/29/2019   Procedure: KYPHOPLASTY T6, L1,L4 ,  RADIOFREQUENCY ABLATION;  Surgeon: Kennedy Bucker, MD;  Location: [facility] ORS;  Service: Orthopedics;  Laterality: N/A;   KYPHOPLASTY Right 09/01/2019   Procedure: Right Sacral Radiofrequency Ablation and Cement  Augmentation, Right sacrum and iliac crest;  Surgeon: Kennedy Bucker, MD;  Location: [facility] ORS;  Service: Orthopedics;  Laterality: Right;   PORTA CATH INSERTION N/A 08/28/2019   Procedure: PORTA CATH INSERTION;  Surgeon: Annice Needy, MD;  Location: [facility] INVASIVE CV LAB;  Service: Cardiovascular;  Laterality: N/A;    Social History   Socioeconomic History   Marital status: Single    Spouse name: Not on file   Number of children: Not on file   Years of education: Not on file   Highest education level: Not on file  Occupational History   Not on file  Tobacco Use   Smoking status: Never   Smokeless tobacco: Never  Vaping Use   Vaping Use: Never used  Substance and Sexual Activity   Alcohol use: Not Currently   Drug use: Not Currently   Sexual activity: Not Currently    Birth control/protection: None  Other Topics Concern   Not on file  Social History Narrative   Lives at home with children   Social Determinants of Health   Financial Resource Strain: High Risk (11/18/2021)   Overall Financial Resource Strain (CARDIA)    Difficulty of Paying Living Expenses: Very hard  Food Insecurity: Food Insecurity Present (06/23/2022)   Hunger Vital Sign    Worried About Radiation protection practitioner of Food  in the Last Year: Often true    Ran Out of Food in the Last Year: Often true  Transportation Needs: Unmet Transportation Needs (07/21/2022)   PRAPARE - Administrator, Civil Service (Medical): Yes    Lack of Transportation (Non-Medical): Yes  Physical Activity: Inactive (11/18/2021)   Exercise Vital Sign    Days of Exercise per Week: 0 days    Minutes of Exercise per Session: 0 min  Stress: Stress Concern Present (11/18/2021)   [employer] of Occupational Health - Occupational Stress Questionnaire    Feeling of Stress : Very much  Social Connections: Socially Isolated (06/23/2022)   Social Connection and Isolation Panel [NHANES]    Frequency of Communication with Friends and Family: Three  times a week    Frequency of Social Gatherings with Friends and Family: Three times a week    Attends Religious Services: Never    Active Member of Clubs or Organizations: No    Attends Banker Meetings: Never    Marital Status: Never married  Intimate Partner Violence: Not At Risk (11/18/2021)   Humiliation, Afraid, Rape, and Kick questionnaire    Fear of Current or Ex-Partner: No    Emotionally Abused: No    Physically Abused: No    Sexually Abused: No    Family History  Problem Relation Age of Onset   Colon cancer Maternal Uncle      Current Outpatient Medications:    Calcium Carb-Cholecalciferol 600-10 MG-MCG TABS, Take 1 tablet by mouth 2 (two) times daily., Disp: 180 tablet, Rfl: 0   capecitabine (XELODA) 500 MG tablet, Take 3 tablets (1,500 mg total) by mouth 2 (two) times daily after a meal. Take for 14 days, then hold for 7 days. Repeat every 21 days., Disp: 84 tablet, Rfl: 2   DULoxetine (CYMBALTA) 60 MG capsule, Take 1 capsule (60 mg total) by mouth daily., Disp: 30 capsule, Rfl: 3   gabapentin (NEURONTIN) 600 MG tablet, Take 1 tablet (600 mg total) by mouth 3 (three) times daily., Disp: 90 tablet, Rfl: 3   HYDROmorphone (DILAUDID) 2 MG tablet, Take 0.5-1 tablets (1-2 mg total) by mouth every 4 (four) hours as needed for severe pain., Disp: 60 tablet, Rfl: 0   lidocaine-prilocaine (EMLA) cream, Apply 1 Application topically as needed. Apply small amount to port site at least 1 hour prior to it being accessed, cover with plastic wrap, Disp: 30 g, Rfl: 3   loratadine (CLARITIN) 10 MG tablet, Take 1 tablet (10 mg total) by mouth daily. For seasonal allergies, Disp: 60 tablet, Rfl: 0   LORazepam (ATIVAN) 0.5 MG tablet, Take 1 tablet (0.5 mg total) by mouth every 6 (six) hours as needed for anxiety (and nausea). AS NEEDED FOR NAUSEA, Disp: 60 tablet, Rfl: 0   morphine (MS CONTIN) 30 MG 12 hr tablet, Take 1 tablet (30 mg total) by mouth every 12 (twelve) hours., Disp: 60  tablet, Rfl: 0   nitrofurantoin, macrocrystal-monohydrate, (MACROBID) 100 MG capsule, Take 1 capsule (100 mg total) by mouth 2 (two) times daily., Disp: 14 capsule, Rfl: 0   OLANZapine (ZYPREXA) 10 MG tablet, Take 1 tablet (10 mg total) by mouth at bedtime as needed (nausea)., Disp: 30 tablet, Rfl: 2   omeprazole (PRILOSEC) 20 MG capsule, Take 1 capsule (20 mg total) by mouth daily., Disp: 30 capsule, Rfl: 3   ondansetron (ZOFRAN) 8 MG tablet, Take 1 tablet (8 mg total) by mouth every 8 (eight) hours as needed for nausea or  vomiting., Disp: 45 tablet, Rfl: 2   TUKYSA 150 MG tablet, Take 2 tablets (300mg ) by mouth twice daily as directed by physician., Disp: 120 tablet, Rfl: 2 No current facility-administered medications for this visit.  Facility-Administered Medications Ordered in Other Visits:    0.9 %  sodium chloride infusion, , Intravenous, Continuous, Burns, Renda Rolls, NP, Stopped at 06/02/20 1453   fam-trastuzumab deruxtecan-nxki (ENHERTU) 300 mg in dextrose 5 % 100 mL chemo infusion, 5.4 mg/kg (Treatment Plan Recorded), Intravenous, Once, Creig Hines, MD, Last Rate: 230 mL/hr at 08/11/22 1216, 300 mg at 08/11/22 1216   heparin lock flush 100 unit/mL, 500 Units, Intravenous, Once, Creig Hines, MD   heparin lock flush 100 unit/mL, 500 Units, Intravenous, Once, Creig Hines, MD   heparin lock flush 100 unit/mL, 500 Units, Intracatheter, Once PRN, Creig Hines, MD   sodium chloride flush (NS) 0.9 % injection 10 mL, 10 mL, Intravenous, Once, Creig Hines, MD   sodium chloride flush (NS) 0.9 % injection 10 mL, 10 mL, Intravenous, Once, Creig Hines, MD  Physical exam:  Vitals:   08/11/22 0956 08/11/22 1001 08/11/22 1005  BP: (!) 85/64 (!) 85/65 100/65  Pulse: 71 70 69  Resp: 18    Temp: (!) 96.8 F (36 C)    TempSrc: Tympanic    SpO2: 99% 99%   Weight: 119 lb 14.4 oz (54.4 kg)    Height: 4\' 11"  (1.499 m)     Physical Exam Constitutional:      Comments: Sitting in a  wheelchair.  Appears in no acute distress  Cardiovascular:     Rate and Rhythm: Normal rate and regular rhythm.     Heart sounds: Normal heart sounds.  Pulmonary:     Effort: Pulmonary effort is normal.     Breath sounds: Normal breath sounds.  Abdominal:     General: Bowel sounds are normal.     Palpations: Abdomen is soft.  Skin:    General: Skin is warm and dry.  Neurological:     Mental Status: She is alert and oriented to person, place, and time.         Latest Ref Rng & Units 08/11/2022    9:27 AM  CMP  Glucose 70 - 99 mg/dL 90   BUN 6 - 20 mg/dL 5   Creatinine 4.09 - 8.11 mg/dL 9.14   Sodium 782 - 956 mmol/L 137   Potassium 3.5 - 5.1 mmol/L 3.4   Chloride 98 - 111 mmol/L 105   CO2 22 - 32 mmol/L 24   Calcium 8.9 - 10.3 mg/dL 8.2   Total Protein 6.5 - 8.1 g/dL 7.4   Total Bilirubin 0.3 - 1.2 mg/dL 0.3   Alkaline Phos 38 - 126 U/L 73   AST 15 - 41 U/L 24   ALT 0 - 44 U/L 15       Latest Ref Rng & Units 08/11/2022    9:27 AM  CBC  WBC 4.0 - 10.5 K/uL 3.4   Hemoglobin 12.0 - 15.0 g/dL 21.3   Hematocrit 08.6 - 46.0 % 36.5   Platelets 150 - 400 K/uL 578     No images are attached to the encounter.  No results found.   Assessment and plan- Patient is a 42 y.o. 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  She is here for on treatment assessment prior to cycle 2 of Enhertu  Count  okay to proceed with cycle 2 of Enhertu today.  I will see her back in 3 weeks for cycle 3.  Plan to repeat scans after 4 cycles patient has stable disease burden systemically.  Her bone mets have remained stable more or less she does not have any evidence of visceral metastatic disease.  However she has had persistent disease progression in her brain since November 2022.  Most recently she progressed on Xeloda Tucatinib Herceptin regimen and was therefore switched to Enhertu.  Treatment options that would have meaningful response  in the brain beyond Enhertu are limited.  She continues to see palliative care as well.  Neoplasm related pain: Continue as needed oxycodone and gabapentin   Visit Diagnosis 1. Primary malignant neoplasm of breast with metastasis (HCC)   2. Multiple pathological fractures, initial encounter      Dr. Owens Shark, MD, MPH [facility] [phone] 08/11/2022 12:24 PM

## 2022-08-11 NOTE — Patient Instructions (Signed)
Instrucciones al darle de alta: Discharge Instructions Gracias por elegir al [facility] de Cncer de [place] para brindarle atencin mdica de oncologa y Teacher, English as a foreign language.   Si usted tiene una cita de laboratorio con [employer] de [place], por favor vaya directamente al [employer] de Cncer y regstrese en el rea de Engineer, maintenance (IT).   Use ropa cmoda y [place] para tener fcil acceso a las vas del Portacath (acceso venoso de Set designer duracin) o la lnea PICC (catter central colocado por va perifrica).   Nos esforzamos por ofrecerle tiempo de calidad con su proveedor. Es posible que tenga que volver a programar su cita si llega tarde (15 minutos o ms).  El llegar tarde le afecta a usted y a otros pacientes cuyas citas son posteriores a Armed forces operational officer.  Adems, si usted falta a tres o ms citas sin avisar a la oficina, puede ser retirado(a) de la clnica a discrecin del proveedor.      Para las solicitudes de renovacin de recetas, pida a su farmacia que se ponga en contacto con nuestra oficina y deje que transcurran 72 horas para que se complete el proceso de las renovaciones.    Hoy usted recibi los siguientes agentes de quimioterapia e/o inmunoterapia Enhertu      Para ayudar a prevenir las nuseas y los vmitos despus de su tratamiento, le recomendamos que tome su medicamento para las nuseas segn las indicaciones.  LOS SNTOMAS QUE DEBEN COMUNICARSE INMEDIATAMENTE SE INDICAN A CONTINUACIN: *FIEBRE SUPERIOR A 100.4 F (38 C) O MS *ESCALOFROS O SUDORACIN *NUSEAS Y VMITOS QUE NO SE CONTROLAN CON EL MEDICAMENTO PARA LAS NUSEAS *DIFICULTAD INUSUAL PARA RESPIRAR  *MORETONES O HEMORRAGIAS NO HABITUALES *PROBLEMAS URINARIOS (dolor o ardor al Geographical information systems officer o frecuencia para Geographical information systems officer) *PROBLEMAS INTESTINALES (diarrea inusual, estreimiento, dolor cerca del ano) SENSIBILIDAD EN LA BOCA Y EN LA GARGANTA CON O SIN LA PRESENCIA DE LCERAS (dolor de garganta, llagas en la boca o dolor de muelas/dientes) ERUPCIN,  HINCHAZN O DOLORES INUSUALES FLUJO VAGINAL INUSUAL O PICAZN/RASQUIA    Los puntos marcados con un asterisco ( *) indican una posible emergencia y debe hacer un seguimiento tan pronto como le sea posible o vaya al Departamento de Emergencias si se le presenta algn problema.  Por favor, muestre la [place] DE ADVERTENCIA DE Marc Morgans DE ADVERTENCIA DE [place] Fanti al registrarse en [address] de Emergencias y a la enfermera de triaje.  Si tiene preguntas despus de su visita o necesita cancelar o volver a programar su cita, por favor pngase en contacto con  [facility]  [phone]  y siga las instrucciones. Las horas de oficina son de 8:00 a.m. a 4:30 p.m. de lunes a viernes. Por favor, tenga en cuenta que los mensajes de voz que se dejan despus de las 4:00 p.m. posiblemente no se devolvern hasta el siguiente da de [place].  Cerramos los fines de semana y [employer]. En todo momento tiene acceso a una enfermera para preguntas urgentes. Por favor, llame al nmero principal de la clnica  [phone] y siga las instrucciones.   Para cualquier pregunta que no sea de carcter urgente, tambin puede ponerse en contacto con su proveedor [employer]. Ahora ofrecemos visitas electrnicas para cualquier persona mayor de 18 aos que solicite atencin mdica en lnea para los sntomas que no sean urgentes. Para ms detalles vaya a [email or web address].   Tambin puede bajar la aplicacin de MyChart! Vaya a la tienda de aplicaciones, busque "MyChart", abra la aplicacin,  seleccione [place], e ingrese con su nombre de usuario y la contrasea de Clinical cytogeneticist.

## 2022-08-11 NOTE — Progress Notes (Signed)
Palliative Medicine [facility] at [facility] [phone] [phone]   Name: Sophia [name] Date: 08/11/2022 MRN: [id number]  DOB: [birth year]  Patient Care Team: Creig Hines, MD as PCP - General (Oncology) Jim Like, RN as Registered Nurse Scarlett Presto, RN (Inactive) as Registered Nurse Creig Hines, MD as Consulting Physician (Hematology and Oncology)    REASON FOR CONSULTATION: Analisha Bourdier is a 42 y.o. 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 MRI of the brain on 02/08/2021 revealed widespread metastatic disease to the brain with mild associated edema and small area of hemorrhage in the right frontal.  Patient is status post whole brain radiation.  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  She was referred to palliative care to help address goals and manage ongoing symptoms  SOCIAL HISTORY:     reports that she has never smoked. She has never used smokeless tobacco. She reports that she does not currently use alcohol. She reports that she does not currently use drugs.  Patient is from [place].  She speaks [place].  She lives here with her 3 children ages 58-16.  She has no family or friend/social support locally.  She is unable to work.  ADVANCE DIRECTIVES:  Does not have  CODE STATUS:   PAST MEDICAL HISTORY: Past Medical History:  Diagnosis Date   Anxiety    Breast cancer (HCC)    with mets   Cancer (HCC)    Colitis    COVID-19 in immunocompromised patient (HCC)    Family history of colon cancer    Vertigo     PAST SURGICAL HISTORY:  Past Surgical History:  Procedure Laterality Date   BREAST BIOPSY Left 08/14/2019   [place] bx of mass, coil marker, path pending   BREAST BIOPSY Left 08/14/2019   [place] bx of LN, hydromarker, path pending   BREAST BIOPSY Left 08/14/2019   affirm bx of calcs, x  marker, path pending   ESOPHAGOGASTRODUODENOSCOPY (EGD) WITH PROPOFOL N/A 10/05/2019   Procedure: ESOPHAGOGASTRODUODENOSCOPY (EGD) WITH PROPOFOL;  Surgeon: Toney Reil, MD;  Location: [facility] ENDOSCOPY;  Service: Gastroenterology;  Laterality: N/A;   FLEXIBLE SIGMOIDOSCOPY N/A 10/05/2019   Procedure: FLEXIBLE SIGMOIDOSCOPY;  Surgeon: Toney Reil, MD;  Location: [facility] ENDOSCOPY;  Service: Gastroenterology;  Laterality: N/A;   INTRAMEDULLARY (IM) NAIL INTERTROCHANTERIC Right 09/01/2019   Procedure: INTRAMEDULLARY (IM) NAIL INTERTROCHANTRIC AND RADIOFREQUENCY ABLATION;  Surgeon: Kennedy Bucker, MD;  Location: [facility] ORS;  Service: Orthopedics;  Laterality: Right;   KYPHOPLASTY N/A 08/29/2019   Procedure: KYPHOPLASTY T6, L1,L4 ,  RADIOFREQUENCY ABLATION;  Surgeon: Kennedy Bucker, MD;  Location: [facility] ORS;  Service: Orthopedics;  Laterality: N/A;   KYPHOPLASTY Right 09/01/2019   Procedure: Right Sacral Radiofrequency Ablation and Cement Augmentation, Right sacrum and iliac crest;  Surgeon: Kennedy Bucker, MD;  Location: [facility] ORS;  Service: Orthopedics;  Laterality: Right;   PORTA CATH INSERTION N/A 08/28/2019   Procedure: PORTA CATH INSERTION;  Surgeon: Annice Needy, MD;  Location: [facility] INVASIVE CV LAB;  Service: Cardiovascular;  Laterality: N/A;    HEMATOLOGY/ONCOLOGY HISTORY:  Oncology History  Primary cancer of left breast with metastasis to other site [facility])  08/28/2019 Initial Diagnosis   Primary cancer of left breast with metastasis to other site [facility])   01/21/2021 Cancer Staging   Staging form: Breast, AJCC 8th Edition - Clinical stage from 01/21/2021: Stage IV (cT2, cN1, cM1, G2,  ER-, PR-, HER2+) - Signed by Creig Hines, MD on 01/21/2021 Histologic grading system: 3 grade system   Primary malignant neoplasm of breast with metastasis (HCC)  08/29/2019 Initial Diagnosis   Metastatic breast cancer (HCC)   09/04/2019 - 10/31/2021 Chemotherapy   Patient is on Treatment Plan : BREAST Weekly  Paclitaxel + Trastuzumab + Pertuzumab q21d      Genetic Testing   Negative genetic testing. No pathogenic variants identified on the [facility] CancerNext-Expanded+RNA panel. The report date is 04/13/2020.   The CancerNext-Expanded + RNAinsight gene panel offered by [employer] and includes sequencing and rearrangement analysis for the following 77 genes: IP, ALK, APC*, ATM*, AXIN2, BAP1, BARD1, BLM, BMPR1A, BRCA1*, BRCA2*, BRIP1*, CDC73, CDH1*,CDK4, CDKN1B, CDKN2A, CHEK2*, CTNNA1, DICER1, FANCC, FH, FLCN, GALNT12, KIF1B, LZTR1, MAX, MEN1, MET, MLH1*, MSH2*, MSH3, MSH6*, MUTYH*, NBN, NF1*, NF2, NTHL1, PALB2*, PHOX2B, PMS2*, POT1, PRKAR1A, PTCH1, PTEN*, RAD51C*, RAD51D*,RB1, RECQL, RET, SDHA, SDHAF2, SDHB, SDHC, SDHD, SMAD4, SMARCA4, SMARCB1, SMARCE1, STK11, SUFU, TMEM127, TP53*,TSC1, TSC2, VHL and XRCC2 (sequencing and deletion/duplication); EGFR, EGLN1, HOXB13, KIT, MITF, PDGFRA, POLD1 and POLE (sequencing only); EPCAM and GREM1 (deletion/duplication only). DNA and RNA analyses performed for * genes.   11/18/2021 - 11/18/2021 Chemotherapy   Patient is on Treatment Plan : BREAST Trastuzumab (8/6) IV D1 + Capecitabine +  Tucatinib q21d     11/18/2021 - 12/09/2021 Chemotherapy   Patient is on Treatment Plan : BREAST Trastuzumab (8/6) IV D1 + Capecitabine +  Tucatinib q21d     12/26/2021 - 06/23/2022 Chemotherapy   Patient is on Treatment Plan : BREAST Trastuzumab (8/6) IV D1 + Capecitabine +  Tucatinib q21d     07/21/2022 -  Chemotherapy   Patient is on Treatment Plan : BREAST METASTATIC Fam-Trastuzumab Deruxtecan-nxki (Enhertu) (5.4) q21d       ALLERGIES:  has No Known Allergies.  MEDICATIONS:  Current Outpatient Medications  Medication Sig Dispense Refill   Calcium Carb-Cholecalciferol 600-10 MG-MCG TABS Take 1 tablet by mouth 2 (two) times daily. 180 tablet 0   capecitabine (XELODA) 500 MG tablet Take 3 tablets (1,500 mg total) by mouth 2 (two) times daily after a meal. Take for 14 days, then hold for 7  days. Repeat every 21 days. 84 tablet 2   DULoxetine (CYMBALTA) 60 MG capsule Take 1 capsule (60 mg total) by mouth daily. 30 capsule 3   gabapentin (NEURONTIN) 600 MG tablet Take 1 tablet (600 mg total) by mouth 3 (three) times daily. 90 tablet 3   HYDROmorphone (DILAUDID) 2 MG tablet Take 0.5-1 tablets (1-2 mg total) by mouth every 4 (four) hours as needed for severe pain. 60 tablet 0   lidocaine-prilocaine (EMLA) cream Apply 1 Application topically as needed. Apply small amount to port site at least 1 hour prior to it being accessed, cover with plastic wrap 30 g 3   loratadine (CLARITIN) 10 MG tablet Take 1 tablet (10 mg total) by mouth daily. For seasonal allergies 60 tablet 0   LORazepam (ATIVAN) 0.5 MG tablet Take 1 tablet (0.5 mg total) by mouth every 6 (six) hours as needed for anxiety (and nausea). AS NEEDED FOR NAUSEA 60 tablet 0   morphine (MS CONTIN) 30 MG 12 hr tablet Take 1 tablet (30 mg total) by mouth every 12 (twelve) hours. 60 tablet 0   nitrofurantoin, macrocrystal-monohydrate, (MACROBID) 100 MG capsule Take 1 capsule (100 mg total) by mouth 2 (two) times daily. 14 capsule 0   OLANZapine (ZYPREXA) 10 MG tablet Take 1 tablet (10 mg  total) by mouth at bedtime as needed (nausea). 30 tablet 2   omeprazole (PRILOSEC) 20 MG capsule Take 1 capsule (20 mg total) by mouth daily. 30 capsule 3   ondansetron (ZOFRAN) 8 MG tablet Take 1 tablet (8 mg total) by mouth every 8 (eight) hours as needed for nausea or vomiting. 45 tablet 2   TUKYSA 150 MG tablet Take 2 tablets (300mg ) by mouth twice daily as directed by physician. 120 tablet 2   No current facility-administered medications for this visit.   Facility-Administered Medications Ordered in Other Visits  Medication Dose Route Frequency Provider Last Rate Last Admin   0.9 %  sodium chloride infusion   Intravenous Continuous Mauro Kaufmann, NP   Stopped at 06/02/20 1453   fam-trastuzumab deruxtecan-nxki (ENHERTU) 300 mg in dextrose 5 %  100 mL chemo infusion  5.4 mg/kg (Treatment Plan Recorded) Intravenous Once Creig Hines, MD       heparin lock flush 100 unit/mL  500 Units Intravenous Once Creig Hines, MD       heparin lock flush 100 unit/mL  500 Units Intravenous Once Creig Hines, MD       heparin lock flush 100 unit/mL  500 Units Intracatheter Once PRN Creig Hines, MD       sodium chloride flush (NS) 0.9 % injection 10 mL  10 mL Intravenous Once Creig Hines, MD       sodium chloride flush (NS) 0.9 % injection 10 mL  10 mL Intravenous Once Creig Hines, MD        VITAL SIGNS: There were no vitals taken for this visit. There were no vitals filed for this visit.   Estimated body mass index is 24.22 kg/m as calculated from the following:   Height as of an earlier encounter on 08/11/22: 4\' 11"  (1.499 m).   Weight as of an earlier encounter on 08/11/22: 119 lb 14.4 oz (54.4 kg).  LABS: CBC:    Component Value Date/Time   WBC 3.4 (L) 08/11/2022 0927   WBC 3.7 (L) 06/23/2022 0957   HGB 12.0 08/11/2022 0927   HCT 36.5 08/11/2022 0927   PLT 578 (H) 08/11/2022 0927   MCV 83.5 08/11/2022 0927   NEUTROABS 1.5 (L) 08/11/2022 0927   LYMPHSABS 1.2 08/11/2022 0927   MONOABS 0.5 08/11/2022 0927   EOSABS 0.1 08/11/2022 0927   BASOSABS 0.0 08/11/2022 0927   Comprehensive Metabolic Panel:    Component Value Date/Time   NA 137 08/11/2022 0927   K 3.4 (L) 08/11/2022 0927   CL 105 08/11/2022 0927   CO2 24 08/11/2022 0927   BUN 5 (L) 08/11/2022 0927   CREATININE 0.42 (L) 08/11/2022 0927   GLUCOSE 90 08/11/2022 0927   CALCIUM 8.2 (L) 08/11/2022 0927   AST 24 08/11/2022 0927   ALT 15 08/11/2022 0927   ALKPHOS 73 08/11/2022 0927   BILITOT 0.3 08/11/2022 0927   PROT 7.4 08/11/2022 0927   ALBUMIN 3.4 (L) 08/11/2022 0927    RADIOGRAPHIC STUDIES: No results found.  PERFORMANCE STATUS (ECOG) : 2 - Symptomatic, <50% confined to bed  Review of Systems Unless otherwise noted, a complete review of systems is  negative.  Physical Exam General: NAD Pulmonary: Unlabored Extremities: no edema, no joint deformities Skin: no rashes Neurological: Weakness but otherwise nonfocal  IMPRESSION: Follow-up visit.  Patient seen in infusion.  Recent MRI of the brain 07/04/2022 showed interval increase in size of brain lesions and concern for leptomeningeal spread.  Patient  was rotated to Enhertu.  Patient remains hopeful that current treatment will be effective.  I sent her home with a Spanish MOST Form to review and will plan to meet with an interpreter in the future to discuss in detail.  Patient reports that her pain is reasonably okay.  She denies new symptomatic complaints or concerns.  She reports poor oral intake.  Patient sent home with samples of nutritional supplements.  Will refer back to nutrition.  PLAN: -Continue current scope of treatment -Continue MS Contin/hydromorphone -Referral to nutrition -Patient given Spanish MOST Form to review -RTC 3 weeks   Patient expressed understanding and was in agreement with this plan. She also understands that She can call the clinic at any time with any questions, concerns, or complaints.     Time Total: 15 minutes  Visit consisted of counseling and education dealing with the complex and emotionally intense issues of symptom management and palliative care in the setting of serious and potentially life-threatening illness.Greater than 50%  of this time was spent counseling and coordinating care related to the above assessment and plan.  Signed by: Laurette Schimke, PhD, NP-C

## 2022-08-11 NOTE — Progress Notes (Signed)
Pt became light headed post port access and complains of headache. VS stable. MD aware. After drinking some water and sitting approx 5 minutes symptoms improved. Per [name] send her to MD office for visit.

## 2022-08-17 ENCOUNTER — Encounter: Payer: Self-pay | Admitting: Licensed Clinical Social Worker

## 2022-08-17 NOTE — Progress Notes (Signed)
[facility] CSW Progress Note  Clinical Child psychotherapist contacted patient by phone. Patient will come and pick up financial assistance on Monday 08/21/2022.  CSW updated front desk, and CSW.    Joseph Art, LCSW Clinical Social Worker [facility]

## 2022-08-17 NOTE — Progress Notes (Signed)
[facility] CSW Progress Note  Clinical Social Worker left voicemail for patient with update that financial assistance is available for pick up.  CSW requested patient call to arrange pickup.    Joseph Art, LCSW Clinical Social Worker [facility]

## 2022-08-22 ENCOUNTER — Other Ambulatory Visit: Payer: Self-pay

## 2022-08-29 ENCOUNTER — Other Ambulatory Visit: Payer: Self-pay

## 2022-08-30 ENCOUNTER — Other Ambulatory Visit: Payer: Self-pay

## 2022-08-30 ENCOUNTER — Emergency Department: Payer: Self-pay

## 2022-08-30 ENCOUNTER — Telehealth: Payer: Self-pay | Admitting: Oncology

## 2022-08-30 ENCOUNTER — Inpatient Hospital Stay
Admission: EM | Admit: 2022-08-30 | Discharge: 2022-09-13 | DRG: 080 | Disposition: A | Discharge status: Hospice | Payer: Self-pay | Attending: Student | Admitting: Student

## 2022-08-30 DIAGNOSIS — R4701 Aphasia: Secondary | ICD-10-CM | POA: Diagnosis present

## 2022-08-30 DIAGNOSIS — Z66 Do not resuscitate: Secondary | ICD-10-CM | POA: Diagnosis present

## 2022-08-30 DIAGNOSIS — Z1501 Genetic susceptibility to malignant neoplasm of breast: Secondary | ICD-10-CM

## 2022-08-30 DIAGNOSIS — Z853 Personal history of malignant neoplasm of breast: Secondary | ICD-10-CM

## 2022-08-30 DIAGNOSIS — C7951 Secondary malignant neoplasm of bone: Secondary | ICD-10-CM | POA: Diagnosis present

## 2022-08-30 DIAGNOSIS — E876 Hypokalemia: Secondary | ICD-10-CM | POA: Diagnosis present

## 2022-08-30 DIAGNOSIS — Z6824 Body mass index (BMI) 24.0-24.9, adult: Secondary | ICD-10-CM

## 2022-08-30 DIAGNOSIS — Z560 Unemployment, unspecified: Secondary | ICD-10-CM

## 2022-08-30 DIAGNOSIS — C7931 Secondary malignant neoplasm of brain: Secondary | ICD-10-CM | POA: Diagnosis present

## 2022-08-30 DIAGNOSIS — G936 Cerebral edema: Principal | ICD-10-CM | POA: Diagnosis present

## 2022-08-30 DIAGNOSIS — F32A Depression, unspecified: Secondary | ICD-10-CM | POA: Diagnosis present

## 2022-08-30 DIAGNOSIS — Z923 Personal history of irradiation: Secondary | ICD-10-CM

## 2022-08-30 DIAGNOSIS — Z79899 Other long term (current) drug therapy: Secondary | ICD-10-CM

## 2022-08-30 DIAGNOSIS — I959 Hypotension, unspecified: Secondary | ICD-10-CM | POA: Diagnosis not present

## 2022-08-30 DIAGNOSIS — R4182 Altered mental status, unspecified: Secondary | ICD-10-CM

## 2022-08-30 DIAGNOSIS — C50912 Malignant neoplasm of unspecified site of left female breast: Secondary | ICD-10-CM | POA: Diagnosis present

## 2022-08-30 DIAGNOSIS — Z638 Other specified problems related to primary support group: Secondary | ICD-10-CM

## 2022-08-30 DIAGNOSIS — Z603 Acculturation difficulty: Secondary | ICD-10-CM | POA: Diagnosis present

## 2022-08-30 DIAGNOSIS — G893 Neoplasm related pain (acute) (chronic): Secondary | ICD-10-CM | POA: Diagnosis present

## 2022-08-30 DIAGNOSIS — F419 Anxiety disorder, unspecified: Secondary | ICD-10-CM | POA: Diagnosis present

## 2022-08-30 DIAGNOSIS — K219 Gastro-esophageal reflux disease without esophagitis: Secondary | ICD-10-CM | POA: Diagnosis present

## 2022-08-30 DIAGNOSIS — Z8616 Personal history of COVID-19: Secondary | ICD-10-CM

## 2022-08-30 DIAGNOSIS — C50919 Malignant neoplasm of unspecified site of unspecified female breast: Secondary | ICD-10-CM | POA: Diagnosis present

## 2022-08-30 DIAGNOSIS — Z515 Encounter for palliative care: Secondary | ICD-10-CM

## 2022-08-30 DIAGNOSIS — F418 Other specified anxiety disorders: Secondary | ICD-10-CM | POA: Diagnosis present

## 2022-08-30 DIAGNOSIS — C7932 Secondary malignant neoplasm of cerebral meninges: Secondary | ICD-10-CM | POA: Diagnosis present

## 2022-08-30 DIAGNOSIS — E440 Moderate protein-calorie malnutrition: Secondary | ICD-10-CM | POA: Diagnosis present

## 2022-08-30 DIAGNOSIS — Z800 Family history of malignant neoplasm of digestive organs: Secondary | ICD-10-CM

## 2022-08-30 DIAGNOSIS — G9341 Metabolic encephalopathy: Secondary | ICD-10-CM | POA: Diagnosis present

## 2022-08-30 DIAGNOSIS — R54 Age-related physical debility: Secondary | ICD-10-CM | POA: Diagnosis present

## 2022-08-30 DIAGNOSIS — Z9221 Personal history of antineoplastic chemotherapy: Secondary | ICD-10-CM

## 2022-08-30 NOTE — Telephone Encounter (Signed)
Patient's friend Addison Naegeli called to report that patient has new onset of confusion, difficulty in finding words, conversation is not coherent. Friend is not with patient currently. She got these information from patient's children.  Patient has breast cancer with brain mets. With the new onset of neurological symtoms I recommend patient to go to ER for evaluation.  Cc Dr. Assunta Gambles to follow in AM.

## 2022-08-30 NOTE — ED Provider Notes (Signed)
[facility] Provider Note    Event Date/Time   First MD Initiated Contact with Patient 08/30/22 2303     (approximate)   History   Altered Mental Status   HPI  Sophia [name] is a 42 y.o. female  with pmh metastatic breast cancer who p/w aphasia.  Patient's son provides most of the history. He notes that around 30 today she was speaking normally. Around 4 he then noticed she was having difficulty getting her words out. Seems to be able to understand. Patient denies new numbness or weakness. Has had subjective fevers at home.  Does endorse headache.  Past Medical History:  Diagnosis Date   Anxiety    Breast cancer (HCC)    with mets   Cancer (HCC)    Colitis    COVID-19 in immunocompromised patient [facility])    Family history of colon cancer    Vertigo     Patient Active Problem List   Diagnosis Date Noted   AMS (altered mental status) 03/15/2021   Femur fracture, right (HCC) 03/14/2021   Fall at home, initial encounter 03/14/2021   C. difficile diarrhea 06/07/2020   E coli infection 06/07/2020   Genetic testing 04/16/2020   Family history of colon cancer    Acute colitis 10/01/2019   Abnormal LFTs 10/01/2019   Malignant neoplasm metastatic to bone (HCC) 09/26/2019   Encounter for monoclonal antibody treatment for malignancy    Goals of care, counseling/discussion    Neoplasm related pain    Primary malignant neoplasm of breast with metastasis (HCC) 08/29/2019   Primary cancer of left breast with metastasis to other site [facility]) 08/28/2019   Multiple pathological fractures 08/28/2019   Anxiety 08/28/2019   Preoperative clearance 08/28/2019   Leukocytosis 08/28/2019     Physical Exam  Triage Vital Signs: ED Triage Vitals  Enc Vitals Group     BP 08/30/22 2249 (!) 119/53     Pulse Rate 08/30/22 2249 67     Resp 08/30/22 2249 17     Temp 08/30/22 2249 98.3 F (36.8 C)     Temp Source 08/30/22 2249 Oral     SpO2 08/30/22 2249 100 %      Weight --      Height --      Head Circumference --      Peak Flow --      Pain Score 08/30/22 2253 0     Pain Loc --      Pain Edu? --      Excl. in GC? --     Most recent vital signs: Vitals:   08/30/22 2249  BP: (!) 119/53  Pulse: 67  Resp: 17  Temp: 98.3 F (36.8 C)  SpO2: 100%     General: Awake, no distress.  CV:  Good peripheral perfusion.  Resp:  Normal effort.  Abd:  No distention.  Neuro:              Other:   Patient is aphasic but able to follow commands PERRLA, EOMI, face symmetric  No pronator drift, 5 out of 5 strength in bilateral upper and lower extremities Visual fields are full to confrontation    ED Results / Procedures / Treatments  Labs (all labs ordered are listed, but only abnormal results are displayed) Labs Reviewed  COMPREHENSIVE METABOLIC PANEL - Abnormal; Notable for the following components:      Result Value   Calcium 8.7 (*)    All other components  within normal limits  CBC WITH DIFFERENTIAL/PLATELET - Abnormal; Notable for the following components:   WBC 2.9 (*)    Platelets 458 (*)    Neutro Abs 1.4 (*)    All other components within normal limits  PROTIME-INR  APTT  MAGNESIUM  URINALYSIS, ROUTINE W REFLEX MICROSCOPIC     EKG  EKG reviewed interpreted myself shows sinus rhythm with normal axis and prolonged QTc interval T wave flattening lead III, aVF, V3 through V6, low voltage in the anterior precordial leads   RADIOLOGY I reviewed patient's CT of the head which is negative for hemorrhage does show vasogenic edema   PROCEDURES:  Critical Care performed: Yes, see critical care procedure note(s)  .Critical Care  Performed by: Georga Hacking, MD Authorized by: Georga Hacking, MD   Critical care provider statement:    Critical care time (minutes):  30   Critical care was time spent personally by me on the following activities:  Development of treatment plan with patient or surrogate, discussions with  consultants, evaluation of patient's response to treatment, examination of patient, ordering and review of laboratory studies, ordering and review of radiographic studies, ordering and performing treatments and interventions, pulse oximetry, re-evaluation of patient's condition and review of old charts   The patient is on the cardiac monitor to evaluate for evidence of arrhythmia and/or significant heart rate changes.   MEDICATIONS ORDERED IN ED: Medications  dexamethasone (DECADRON) injection 10 mg (has no administration in time range)  iohexol (OMNIPAQUE) 350 MG/ML injection 75 mL (75 mLs Intravenous Contrast Given 08/31/22 0034)     IMPRESSION / MDM / ASSESSMENT AND PLAN / ED COURSE  I reviewed the triage vital signs and the nursing notes.                              Patient's presentation is most consistent with acute presentation with potential threat to life or bodily function.  Differential diagnosis includes, but is not limited to, cerebral edema secondary to mets, hemorrhagic met, CVA, postictal state, electrolyte abnormality  The patient is a 42 year old female with past medical history of breast cancer with mets to bone and brain who presents today because of aphasia.  Her son noticed this around 4 PM.  She had been last seen normal around 11 AM.  Typically does not have difficulty with speaking.  History and exam are somewhat difficult given patient is Spanish-speaking and has aphasia but I am able to appreciate that she has difficulty getting her words out but seems to understand and can follow commands.  She has no facial asymmetry, no limb weakness and full visual fields.  Thus I did not call a code stroke as patient is outside the window for TNK and has known brain mets.  She otherwise complains of some subjective fever over the last week or so.  And obtain labs and Noncon CT head and CTA.  Anticipate patient will need admission for MRI.  CT head Noncon shows mostly stable  vasogenic edema.  CTA is without LVO.  On repeat assessment patient continues to have significant expressive aphasia.  I will order brain MRI with and without contrast and give a dose of IV Decadron.  Will admit, patient will benefit from oncology consult during admission.     FINAL CLINICAL IMPRESSION(S) / ED DIAGNOSES   Final diagnoses:  Aphasia     Rx / DC Orders   ED Discharge Orders  None        Note:  This document was prepared using Dragon voice recognition software and may include unintentional dictation errors.   Georga Hacking, MD 08/31/22 [phone]

## 2022-08-30 NOTE — ED Triage Notes (Signed)
Pt presents to ER with c/o ams.  Pt acc to ER by her son,  who states that pt has had increased trouble getting her words out.  Per son, this has been going on since around 1700 today.  Pt reports she knows what people are saying, but is unable to verbalize her needs/requests.  Pt unable to state where she is, year, or name.  Pt otherwise in NAD.  Hx of breast cancer, with brain mets.

## 2022-08-31 ENCOUNTER — Other Ambulatory Visit: Payer: Self-pay

## 2022-08-31 ENCOUNTER — Other Ambulatory Visit: Payer: Self-pay | Admitting: Pharmacist

## 2022-08-31 ENCOUNTER — Encounter: Payer: Self-pay | Admitting: Family Medicine

## 2022-08-31 ENCOUNTER — Telehealth: Payer: Self-pay

## 2022-08-31 ENCOUNTER — Inpatient Hospital Stay: Payer: Self-pay

## 2022-08-31 ENCOUNTER — Emergency Department: Payer: Self-pay

## 2022-08-31 DIAGNOSIS — R4701 Aphasia: Secondary | ICD-10-CM

## 2022-08-31 DIAGNOSIS — G9341 Metabolic encephalopathy: Secondary | ICD-10-CM | POA: Diagnosis present

## 2022-08-31 DIAGNOSIS — E43 Unspecified severe protein-calorie malnutrition: Secondary | ICD-10-CM

## 2022-08-31 DIAGNOSIS — C7931 Secondary malignant neoplasm of brain: Secondary | ICD-10-CM

## 2022-08-31 DIAGNOSIS — C50919 Malignant neoplasm of unspecified site of unspecified female breast: Secondary | ICD-10-CM | POA: Diagnosis present

## 2022-08-31 DIAGNOSIS — F418 Other specified anxiety disorders: Secondary | ICD-10-CM

## 2022-08-31 DIAGNOSIS — R4182 Altered mental status, unspecified: Secondary | ICD-10-CM

## 2022-08-31 DIAGNOSIS — G893 Neoplasm related pain (acute) (chronic): Secondary | ICD-10-CM

## 2022-08-31 DIAGNOSIS — K219 Gastro-esophageal reflux disease without esophagitis: Secondary | ICD-10-CM | POA: Diagnosis present

## 2022-08-31 DIAGNOSIS — C50912 Malignant neoplasm of unspecified site of left female breast: Secondary | ICD-10-CM

## 2022-08-31 DIAGNOSIS — Z515 Encounter for palliative care: Secondary | ICD-10-CM

## 2022-08-31 LAB — CBC WITH DIFFERENTIAL/PLATELET
Abs Immature Granulocytes: 0.01 10*3/uL (ref 0.00–0.07)
Basophils Absolute: 0 10*3/uL (ref 0.0–0.1)
Basophils Relative: 1 %
Eosinophils Absolute: 0.1 10*3/uL (ref 0.0–0.5)
Eosinophils Relative: 5 %
HCT: 37.6 % (ref 36.0–46.0)
Hemoglobin: 12.1 g/dL (ref 12.0–15.0)
Immature Granulocytes: 0 %
Lymphocytes Relative: 33 %
Lymphs Abs: 1 10*3/uL (ref 0.7–4.0)
MCH: 27.4 pg (ref 26.0–34.0)
MCHC: 32.2 g/dL (ref 30.0–36.0)
MCV: 85.1 fL (ref 80.0–100.0)
Monocytes Absolute: 0.4 10*3/uL (ref 0.1–1.0)
Monocytes Relative: 12 %
Neutro Abs: 1.4 10*3/uL — ABNORMAL LOW (ref 1.7–7.7)
Neutrophils Relative %: 49 %
Platelets: 458 10*3/uL — ABNORMAL HIGH (ref 150–400)
RBC: 4.42 MIL/uL (ref 3.87–5.11)
RDW: 15.3 % (ref 11.5–15.5)
WBC: 2.9 10*3/uL — ABNORMAL LOW (ref 4.0–10.5)
nRBC: 0 % (ref 0.0–0.2)

## 2022-08-31 LAB — BASIC METABOLIC PANEL
Anion gap: 8 (ref 5–15)
BUN: 7 mg/dL (ref 6–20)
CO2: 23 mmol/L (ref 22–32)
Calcium: 8.8 mg/dL — ABNORMAL LOW (ref 8.9–10.3)
Chloride: 108 mmol/L (ref 98–111)
Creatinine, Ser: 0.45 mg/dL (ref 0.44–1.00)
GFR, Estimated: >60 mL/min (ref >60)
Glucose, Bld: 96 mg/dL (ref 70–99)
Potassium: 3.8 mmol/L (ref 3.5–5.1)
Sodium: 139 mmol/L (ref 135–145)

## 2022-08-31 LAB — COMPREHENSIVE METABOLIC PANEL
ALT: 14 U/L (ref 0–44)
AST: 24 U/L (ref 15–41)
Albumin: 3.8 g/dL (ref 3.5–5.0)
Alkaline Phosphatase: 73 U/L (ref 38–126)
Anion gap: 11 (ref 5–15)
BUN: 8 mg/dL (ref 6–20)
CO2: 23 mmol/L (ref 22–32)
Calcium: 8.7 mg/dL — ABNORMAL LOW (ref 8.9–10.3)
Chloride: 104 mmol/L (ref 98–111)
Creatinine, Ser: 0.53 mg/dL (ref 0.44–1.00)
GFR, Estimated: >60 mL/min (ref >60)
Glucose, Bld: 89 mg/dL (ref 70–99)
Potassium: 3.8 mmol/L (ref 3.5–5.1)
Sodium: 138 mmol/L (ref 135–145)
Total Bilirubin: 0.6 mg/dL (ref 0.3–1.2)
Total Protein: 7.3 g/dL (ref 6.5–8.1)

## 2022-08-31 LAB — PROTIME-INR
INR: 1.1 (ref 0.8–1.2)
Prothrombin Time: 14 seconds (ref 11.4–15.2)

## 2022-08-31 LAB — CBC
HCT: 34.5 % — ABNORMAL LOW (ref 36.0–46.0)
Hemoglobin: 11.3 g/dL — ABNORMAL LOW (ref 12.0–15.0)
MCH: 27.1 pg (ref 26.0–34.0)
MCHC: 32.8 g/dL (ref 30.0–36.0)
MCV: 82.7 fL (ref 80.0–100.0)
Platelets: 451 10*3/uL — ABNORMAL HIGH (ref 150–400)
RBC: 4.17 MIL/uL (ref 3.87–5.11)
RDW: 15.3 % (ref 11.5–15.5)
WBC: 2.5 10*3/uL — ABNORMAL LOW (ref 4.0–10.5)
nRBC: 0 % (ref 0.0–0.2)

## 2022-08-31 LAB — MAGNESIUM: Magnesium: 2.3 mg/dL (ref 1.7–2.4)

## 2022-08-31 LAB — HIV ANTIBODY (ROUTINE TESTING W REFLEX): HIV Screen 4th Generation wRfx: NONREACTIVE

## 2022-08-31 LAB — APTT: aPTT: 36 seconds (ref 24–36)

## 2022-08-31 MED ORDER — DEXAMETHASONE SODIUM PHOSPHATE 4 MG/ML IJ SOLN
4.0000 mg | Freq: Three times a day (TID) | INTRAMUSCULAR | Status: DC
  Administered 2022-08-31 – 2022-09-03 (×9): 4 mg via INTRAVENOUS
  Filled 2022-08-31 (×10): qty 1

## 2022-08-31 MED ORDER — LORAZEPAM 2 MG/ML IJ SOLN: 2.0000 mg | INTRAMUSCULAR | Status: DC | PRN

## 2022-08-31 MED ORDER — SODIUM CHLORIDE 0.9% FLUSH: 10.0000 mL | INTRAVENOUS | Status: DC | PRN

## 2022-08-31 MED ORDER — OXYCODONE-ACETAMINOPHEN 5-325 MG PO TABS
1.0000 | ORAL_TABLET | ORAL | Status: DC | PRN
  Administered 2022-08-31 – 2022-09-11 (×7): 1 via ORAL
  Filled 2022-08-31 (×8): qty 1

## 2022-08-31 MED ORDER — LEVETIRACETAM IN NACL 500 MG/100ML IV SOLN
500.0000 mg | Freq: Two times a day (BID) | INTRAVENOUS | Status: DC
  Administered 2022-08-31 – 2022-09-03 (×6): 500 mg via INTRAVENOUS
  Filled 2022-08-31 (×7): qty 100

## 2022-08-31 MED ORDER — MORPHINE SULFATE ER 15 MG PO TBCR: 30.0000 mg | EXTENDED_RELEASE_TABLET | Freq: Two times a day (BID) | ORAL | Status: DC

## 2022-08-31 MED ORDER — HYDROMORPHONE HCL 2 MG PO TABS: 1.0000 mg | ORAL_TABLET | ORAL | Status: DC | PRN

## 2022-08-31 MED ORDER — IOHEXOL 350 MG/ML SOLN
75.0000 mL | Freq: Once | INTRAVENOUS | Status: AC | PRN
  Administered 2022-08-31: 75 mL via INTRAVENOUS

## 2022-08-31 MED ORDER — GADOBUTROL 1 MMOL/ML IV SOLN: 5.0000 mL | Freq: Once | INTRAVENOUS | Status: DC | PRN

## 2022-08-31 MED ORDER — HEPARIN SODIUM (PORCINE) 5000 UNIT/ML IJ SOLN
5000.0000 [IU] | Freq: Three times a day (TID) | INTRAMUSCULAR | Status: DC
  Administered 2022-08-31 – 2022-09-06 (×20): 5000 [IU] via SUBCUTANEOUS
  Filled 2022-08-31 (×20): qty 1

## 2022-08-31 MED ORDER — ENOXAPARIN SODIUM 40 MG/0.4ML IJ SOSY: 40.0000 mg | PREFILLED_SYRINGE | INTRAMUSCULAR | Status: DC

## 2022-08-31 MED ORDER — SODIUM CHLORIDE 0.9% FLUSH
10.0000 mL | Freq: Two times a day (BID) | INTRAVENOUS | Status: DC
  Administered 2022-08-31 – 2022-09-12 (×18): 10 mL
  Administered 2022-09-13: 30 mL

## 2022-08-31 MED ORDER — CHLORHEXIDINE GLUCONATE CLOTH 2 % EX PADS
6.0000 | MEDICATED_PAD | Freq: Every day | CUTANEOUS | Status: DC
  Administered 2022-09-01 – 2022-09-13 (×12): 6 via TOPICAL

## 2022-08-31 MED ORDER — DULOXETINE HCL 60 MG PO CPEP
60.0000 mg | ORAL_CAPSULE | Freq: Every day | ORAL | Status: DC
  Administered 2022-08-31 – 2022-09-13 (×14): 60 mg via ORAL
  Filled 2022-08-31: qty 1
  Filled 2022-08-31 (×2): qty 2
  Filled 2022-08-31 (×4): qty 1
  Filled 2022-08-31 (×2): qty 2
  Filled 2022-08-31 (×2): qty 1
  Filled 2022-08-31: qty 2
  Filled 2022-08-31: qty 1
  Filled 2022-08-31 (×2): qty 2
  Filled 2022-08-31 (×3): qty 1
  Filled 2022-08-31 (×3): qty 2
  Filled 2022-08-31: qty 1
  Filled 2022-08-31: qty 2
  Filled 2022-08-31 (×2): qty 1
  Filled 2022-08-31: qty 2

## 2022-08-31 MED ORDER — MORPHINE SULFATE ER 30 MG PO TBCR
30.0000 mg | EXTENDED_RELEASE_TABLET | Freq: Two times a day (BID) | ORAL | Status: DC
  Administered 2022-08-31 – 2022-09-13 (×26): 30 mg via ORAL
  Filled 2022-08-31 (×27): qty 1

## 2022-08-31 MED ORDER — OYSTER SHELL CALCIUM/D3 500-5 MG-MCG PO TABS
1.0000 | ORAL_TABLET | Freq: Two times a day (BID) | ORAL | Status: DC
  Administered 2022-08-31 – 2022-09-13 (×25): 1 via ORAL
  Filled 2022-08-31 (×27): qty 1

## 2022-08-31 MED ORDER — TRAZODONE HCL 50 MG PO TABS
25.0000 mg | ORAL_TABLET | Freq: Every evening | ORAL | Status: DC | PRN
  Administered 2022-09-01 – 2022-09-12 (×9): 25 mg via ORAL
  Filled 2022-08-31 (×9): qty 1

## 2022-08-31 MED ORDER — NITROFURANTOIN MONOHYD MACRO 100 MG PO CAPS: 100.0000 mg | ORAL_CAPSULE | Freq: Two times a day (BID) | ORAL | Status: DC

## 2022-08-31 MED ORDER — OLANZAPINE 10 MG PO TABS: 10.0000 mg | ORAL_TABLET | Freq: Every evening | ORAL | Status: DC | PRN

## 2022-08-31 MED ORDER — ONDANSETRON HCL 4 MG PO TABS: 4.0000 mg | ORAL_TABLET | Freq: Four times a day (QID) | ORAL | Status: DC | PRN

## 2022-08-31 MED ORDER — DEXAMETHASONE SODIUM PHOSPHATE 10 MG/ML IJ SOLN
10.0000 mg | Freq: Once | INTRAMUSCULAR | Status: AC
  Administered 2022-08-31: 10 mg via INTRAVENOUS
  Filled 2022-08-31: qty 1

## 2022-08-31 MED ORDER — LORAZEPAM 0.5 MG PO TABS: 0.5000 mg | ORAL_TABLET | Freq: Four times a day (QID) | ORAL | Status: DC | PRN

## 2022-08-31 MED ORDER — LEVETIRACETAM 500 MG PO TABS
500.0000 mg | ORAL_TABLET | Freq: Two times a day (BID) | ORAL | Status: DC
  Filled 2022-08-31: qty 1

## 2022-08-31 MED ORDER — HYDROMORPHONE HCL 1 MG/ML IJ SOLN
1.0000 mg | INTRAMUSCULAR | Status: DC | PRN
  Administered 2022-08-31 – 2022-09-12 (×9): 1 mg via INTRAVENOUS
  Filled 2022-08-31 (×9): qty 1

## 2022-08-31 MED ORDER — ACETAMINOPHEN 325 MG PO TABS
650.0000 mg | ORAL_TABLET | Freq: Four times a day (QID) | ORAL | Status: DC | PRN
  Administered 2022-09-04: 650 mg via ORAL
  Filled 2022-08-31: qty 2

## 2022-08-31 MED ORDER — PANTOPRAZOLE SODIUM 40 MG PO TBEC
40.0000 mg | DELAYED_RELEASE_TABLET | Freq: Every day | ORAL | Status: DC
  Administered 2022-08-31 – 2022-09-13 (×14): 40 mg via ORAL
  Filled 2022-08-31 (×14): qty 1

## 2022-08-31 MED ORDER — LORATADINE 10 MG PO TABS
10.0000 mg | ORAL_TABLET | Freq: Every day | ORAL | Status: DC
  Administered 2022-08-31 – 2022-09-13 (×13): 10 mg via ORAL
  Filled 2022-08-31 (×14): qty 1

## 2022-08-31 MED ORDER — SODIUM CHLORIDE 0.9 % IV SOLN: INTRAVENOUS | Status: DC

## 2022-08-31 MED ORDER — ADULT MULTIVITAMIN W/MINERALS CH
1.0000 | ORAL_TABLET | Freq: Every day | ORAL | Status: DC
  Administered 2022-09-02 – 2022-09-13 (×12): 1 via ORAL
  Filled 2022-08-31 (×13): qty 1

## 2022-08-31 MED ORDER — ENSURE ENLIVE PO LIQD
237.0000 mL | Freq: Two times a day (BID) | ORAL | Status: DC
  Administered 2022-08-31 – 2022-09-13 (×14): 237 mL via ORAL

## 2022-08-31 MED ORDER — ONDANSETRON HCL 4 MG/2ML IJ SOLN: 4.0000 mg | Freq: Four times a day (QID) | INTRAMUSCULAR | Status: DC | PRN

## 2022-08-31 MED ORDER — MORPHINE SULFATE (PF) 4 MG/ML IV SOLN
4.0000 mg | Freq: Once | INTRAVENOUS | Status: AC
  Administered 2022-08-31: 4 mg via INTRAVENOUS
  Filled 2022-08-31: qty 1

## 2022-08-31 MED ORDER — MAGNESIUM HYDROXIDE 400 MG/5ML PO SUSP
30.0000 mL | Freq: Every day | ORAL | Status: DC | PRN
  Administered 2022-09-04 – 2022-09-12 (×2): 30 mL via ORAL
  Filled 2022-08-31 (×2): qty 30

## 2022-08-31 MED ORDER — TUCATINIB 150 MG PO TABS: 300.0000 mg | ORAL_TABLET | Freq: Two times a day (BID) | ORAL | Status: DC

## 2022-08-31 MED ORDER — CAPECITABINE 500 MG PO TABS: 1500.0000 mg | ORAL_TABLET | Freq: Two times a day (BID) | ORAL | Status: DC

## 2022-08-31 MED ORDER — HYDROMORPHONE HCL 2 MG PO TABS
1.0000 mg | ORAL_TABLET | ORAL | Status: DC | PRN
  Filled 2022-08-31: qty 1

## 2022-08-31 MED ORDER — GADOBUTROL 1 MMOL/ML IV SOLN
5.0000 mL | Freq: Once | INTRAVENOUS | Status: AC | PRN
  Administered 2022-08-31: 5 mL via INTRAVENOUS

## 2022-08-31 MED ORDER — ACETAMINOPHEN 650 MG RE SUPP: 650.0000 mg | Freq: Four times a day (QID) | RECTAL | Status: DC | PRN

## 2022-08-31 MED ORDER — ONDANSETRON HCL 4 MG PO TABS: 8.0000 mg | ORAL_TABLET | Freq: Three times a day (TID) | ORAL | Status: DC | PRN

## 2022-08-31 MED ORDER — LEVETIRACETAM IN NACL 500 MG/100ML IV SOLN
500.0000 mg | Freq: Once | INTRAVENOUS | Status: AC
  Administered 2022-08-31: 500 mg via INTRAVENOUS
  Filled 2022-08-31: qty 100

## 2022-08-31 MED ORDER — GABAPENTIN 300 MG PO CAPS
600.0000 mg | ORAL_CAPSULE | Freq: Three times a day (TID) | ORAL | Status: DC
  Administered 2022-08-31 – 2022-09-13 (×38): 600 mg via ORAL
  Filled 2022-08-31 (×40): qty 2

## 2022-08-31 MED FILL — Dexamethasone Sodium Phosphate Inj 100 MG/10ML: INTRAMUSCULAR | Qty: 1 | Status: AC

## 2022-08-31 MED FILL — Fosaprepitant Dimeglumine For IV Infusion 150 MG (Base Eq): INTRAVENOUS | Qty: 5 | Status: AC

## 2022-08-31 NOTE — Consult Note (Signed)
Palliative Medicine [facility] at [facility] Telephone:([phone]) [phone] [phone]) [phone]   Name: Sophia [name] Date: 08/31/2022 MRN: [id number]  DOB: 1980-07-12  Patient Care Team: Creig Hines, MD as PCP - General (Oncology) Jim Like, RN as Registered Nurse Scarlett Presto, RN (Inactive) as Registered Nurse Creig Hines, MD as Consulting Physician (Hematology and Oncology)    REASON FOR CONSULTATION: Sophia [name] is a 42 y.o. 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 Patient is status post whole brain radiation.  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  Patient was rotated to [facility].  Patient is now admitted 08/31/2022 with altered mental status.  Repeat MRI of the brain shows mixed response with some shrinkage of previously existing metastatic lesions.  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  She was referred to palliative care to help address goals.  SOCIAL HISTORY:     reports that she has never smoked. She has never used smokeless tobacco. She reports that she does not currently use alcohol. She reports that she does not currently use drugs.  Patient is from [place].  She speaks [place].  She lives here with her 3 children ages 39-18.  She has no family or friend/social support locally.  She is unable to work.   ADVANCE DIRECTIVES:  Does not have  CODE STATUS: DNR  PAST MEDICAL HISTORY: Past Medical History:  Diagnosis Date   Anxiety    Breast cancer (HCC)    with mets   Cancer (HCC)    Colitis    COVID-19 in immunocompromised patient (HCC)    Family history of colon cancer    Vertigo     PAST SURGICAL HISTORY:  Past Surgical History:  Procedure Laterality Date   BREAST BIOPSY Left 08/14/2019   [place] bx of mass, coil marker, path pending    BREAST BIOPSY Left 08/14/2019   [place] bx of LN, hydromarker, path pending   BREAST BIOPSY Left 08/14/2019   affirm bx of calcs, x marker, path pending   ESOPHAGOGASTRODUODENOSCOPY (EGD) WITH PROPOFOL N/A 10/05/2019   Procedure: ESOPHAGOGASTRODUODENOSCOPY (EGD) WITH PROPOFOL;  Surgeon: Toney Reil, MD;  Location: [facility] ENDOSCOPY;  Service: Gastroenterology;  Laterality: N/A;   FLEXIBLE SIGMOIDOSCOPY N/A 10/05/2019   Procedure: FLEXIBLE SIGMOIDOSCOPY;  Surgeon: Toney Reil, MD;  Location: [facility] ENDOSCOPY;  Service: Gastroenterology;  Laterality: N/A;   INTRAMEDULLARY (IM) NAIL INTERTROCHANTERIC Right 09/01/2019   Procedure: INTRAMEDULLARY (IM) NAIL INTERTROCHANTRIC AND RADIOFREQUENCY ABLATION;  Surgeon: Kennedy Bucker, MD;  Location: [facility] ORS;  Service: Orthopedics;  Laterality: Right;   KYPHOPLASTY N/A 08/29/2019   Procedure: KYPHOPLASTY T6, L1,L4 ,  RADIOFREQUENCY ABLATION;  Surgeon: Kennedy Bucker, MD;  Location: [facility] ORS;  Service: Orthopedics;  Laterality: N/A;   KYPHOPLASTY Right 09/01/2019   Procedure: Right Sacral Radiofrequency Ablation and Cement Augmentation, Right sacrum and iliac crest;  Surgeon: Kennedy Bucker, MD;  Location: [facility] ORS;  Service: Orthopedics;  Laterality: Right;   PORTA CATH INSERTION N/A 08/28/2019   Procedure: PORTA CATH INSERTION;  Surgeon: Annice Needy, MD;  Location: [facility] INVASIVE CV LAB;  Service: Cardiovascular;  Laterality: N/A;    HEMATOLOGY/ONCOLOGY HISTORY:  Oncology History  Primary cancer of left breast with metastasis to other site [facility])  08/28/2019 Initial Diagnosis   Primary cancer of left breast with metastasis to other site [facility])   01/21/2021 Cancer Staging  Staging form: Breast, AJCC 8th Edition - Clinical stage from 01/21/2021: Stage IV (cT2, cN1, cM1, G2, ER-, PR-, HER2+) - Signed by Creig Hines, MD on 01/21/2021 Histologic grading system: 3 grade system   Primary malignant neoplasm of breast with metastasis (HCC)  08/29/2019 Initial  Diagnosis   Metastatic breast cancer (HCC)   09/04/2019 - 10/31/2021 Chemotherapy   Patient is on Treatment Plan : BREAST Weekly Paclitaxel + Trastuzumab + Pertuzumab q21d      Genetic Testing   Negative genetic testing. No pathogenic variants identified on the [facility] CancerNext-Expanded+RNA panel. The report date is 04/13/2020.   The CancerNext-Expanded + RNAinsight gene panel offered by [employer] and includes sequencing and rearrangement analysis for the following 77 genes: IP, ALK, APC*, ATM*, AXIN2, BAP1, BARD1, BLM, BMPR1A, BRCA1*, BRCA2*, BRIP1*, CDC73, CDH1*,CDK4, CDKN1B, CDKN2A, CHEK2*, CTNNA1, DICER1, FANCC, FH, FLCN, GALNT12, KIF1B, LZTR1, MAX, MEN1, MET, MLH1*, MSH2*, MSH3, MSH6*, MUTYH*, NBN, NF1*, NF2, NTHL1, PALB2*, PHOX2B, PMS2*, POT1, PRKAR1A, PTCH1, PTEN*, RAD51C*, RAD51D*,RB1, RECQL, RET, SDHA, SDHAF2, SDHB, SDHC, SDHD, SMAD4, SMARCA4, SMARCB1, SMARCE1, STK11, SUFU, TMEM127, TP53*,TSC1, TSC2, VHL and XRCC2 (sequencing and deletion/duplication); EGFR, EGLN1, HOXB13, KIT, MITF, PDGFRA, POLD1 and POLE (sequencing only); EPCAM and GREM1 (deletion/duplication only). DNA and RNA analyses performed for * genes.   11/18/2021 - 11/18/2021 Chemotherapy   Patient is on Treatment Plan : BREAST Trastuzumab (8/6) IV D1 + Capecitabine +  Tucatinib q21d     11/18/2021 - 12/09/2021 Chemotherapy   Patient is on Treatment Plan : BREAST Trastuzumab (8/6) IV D1 + Capecitabine +  Tucatinib q21d     12/26/2021 - 06/23/2022 Chemotherapy   Patient is on Treatment Plan : BREAST Trastuzumab (8/6) IV D1 + Capecitabine +  Tucatinib q21d     07/21/2022 -  Chemotherapy   Patient is on Treatment Plan : BREAST METASTATIC Fam-Trastuzumab Deruxtecan-nxki ([facility]) (5.4) q21d       ALLERGIES:  has No Known Allergies.  MEDICATIONS:  Current Facility-Administered Medications  Medication Dose Route Frequency Provider Last Rate Last Admin   0.9 %  sodium chloride infusion   Intravenous Continuous Mansy, Jan A, MD        acetaminophen (TYLENOL) tablet 650 mg  650 mg Oral Q6H PRN Mansy, Jan A, MD       Or   acetaminophen (TYLENOL) suppository 650 mg  650 mg Rectal Q6H PRN Mansy, Vernetta Honey, MD       calcium-vitamin D (OSCAL WITH D) 500-5 MG-MCG per tablet 1 tablet  1 tablet Oral BID Mansy, Jan A, MD   1 tablet at 08/31/22 0936   dexamethasone (DECADRON) injection 4 mg  4 mg Intravenous Alean Rinne, MD       DULoxetine (CYMBALTA) DR capsule 60 mg  60 mg Oral Daily Mansy, Jan A, MD   60 mg at 08/31/22 0936   feeding supplement (ENSURE ENLIVE / ENSURE PLUS) liquid 237 mL  237 mL Oral BID BM Lorretta Harp, MD       gabapentin (NEURONTIN) capsule 600 mg  600 mg Oral TID Mansy, Jan A, MD       gadobutrol (GADAVIST) 1 MMOL/ML injection 5 mL  5 mL Intravenous Once PRN Georga Hacking, MD       heparin injection 5,000 Units  5,000 Units Subcutaneous Q8H Lorretta Harp, MD   5,000 Units at 08/31/22 1610   HYDROmorphone (DILAUDID) injection 1 mg  1 mg Intravenous Q4H PRN Lorretta Harp, MD       levETIRAcetam (KEPPRA)  tablet 500 mg  500 mg Oral BID Earline Stiner, Daryl Eastern, NP       loratadine (CLARITIN) tablet 10 mg  10 mg Oral Daily Mansy, Jan A, MD   10 mg at 08/31/22 1610   LORazepam (ATIVAN) injection 2 mg  2 mg Intravenous Q2H PRN Lorretta Harp, MD       LORazepam (ATIVAN) tablet 0.5 mg  0.5 mg Oral Q6H PRN Mansy, Jan A, MD       magnesium hydroxide (MILK OF MAGNESIA) suspension 30 mL  30 mL Oral Daily PRN Mansy, Jan A, MD       morphine (MS CONTIN) 12 hr tablet 30 mg  30 mg Oral Q12H Mansy, Jan A, MD   30 mg at 08/31/22 0934   [START ON 09/01/2022] multivitamin with minerals tablet 1 tablet  1 tablet Oral Daily Lorretta Harp, MD       OLANZapine (ZYPREXA) tablet 10 mg  10 mg Oral QHS PRN Mansy, Jan A, MD       ondansetron [facility]) tablet 4 mg  4 mg Oral Q6H PRN Mansy, Jan A, MD       Or   ondansetron [facility]) injection 4 mg  4 mg Intravenous Q6H PRN Mansy, Vernetta Honey, MD       oxyCODONE-acetaminophen (PERCOCET/ROXICET) 5-325 MG per tablet 1  tablet  1 tablet Oral Q4H PRN Lorretta Harp, MD   1 tablet at 08/31/22 0933   pantoprazole (PROTONIX) EC tablet 40 mg  40 mg Oral Daily Mansy, Jan A, MD   40 mg at 08/31/22 0936   traZODone (DESYREL) tablet 25 mg  25 mg Oral QHS PRN Mansy, Vernetta Honey, MD       Facility-Administered Medications Ordered in Other Encounters  Medication Dose Route Frequency Provider Last Rate Last Admin   0.9 %  sodium chloride infusion   Intravenous Continuous Mauro Kaufmann, NP   Stopped at 06/02/20 1453   heparin lock flush 100 unit/mL  500 Units Intravenous Once Creig Hines, MD       heparin lock flush 100 unit/mL  500 Units Intravenous Once Creig Hines, MD       sodium chloride flush (NS) 0.9 % injection 10 mL  10 mL Intravenous Once Creig Hines, MD       sodium chloride flush (NS) 0.9 % injection 10 mL  10 mL Intravenous Once Creig Hines, MD        VITAL SIGNS: BP (!) 97/58 (BP Location: Left Arm)   Pulse 61   Temp 98.3 F (36.8 C)   Resp 18   SpO2 98%  There were no vitals filed for this visit.  Estimated body mass index is 24.22 kg/m as calculated from the following:   Height as of 08/11/22: 4\' 11"  (1.499 m).   Weight as of 08/11/22: 119 lb 14.4 oz (54.4 kg).  LABS: CBC:    Component Value Date/Time   WBC 2.5 (L) 08/31/2022 0512   HGB 11.3 (L) 08/31/2022 0512   HGB 12.0 08/11/2022 0927   HCT 34.5 (L) 08/31/2022 0512   PLT 451 (H) 08/31/2022 0512   PLT 578 (H) 08/11/2022 0927   MCV 82.7 08/31/2022 0512   NEUTROABS 1.4 (L) 08/30/2022 2357   LYMPHSABS 1.0 08/30/2022 2357   MONOABS 0.4 08/30/2022 2357   EOSABS 0.1 08/30/2022 2357   BASOSABS 0.0 08/30/2022 2357   Comprehensive Metabolic Panel:    Component Value Date/Time   NA 139 08/31/2022 0512  K 3.8 08/31/2022 0512   CL 108 08/31/2022 0512   CO2 23 08/31/2022 0512   BUN 7 08/31/2022 0512   CREATININE 0.45 08/31/2022 0512   CREATININE 0.42 (L) 08/11/2022 0927   GLUCOSE 96 08/31/2022 0512   CALCIUM 8.8 (L) 08/31/2022 0512    AST 24 08/30/2022 2357   AST 24 08/11/2022 0927   ALT 14 08/30/2022 2357   ALT 15 08/11/2022 0927   ALKPHOS 73 08/30/2022 2357   BILITOT 0.6 08/30/2022 2357   BILITOT 0.3 08/11/2022 0927   PROT 7.3 08/30/2022 2357   ALBUMIN 3.8 08/30/2022 2357    RADIOGRAPHIC STUDIES: MR Brain W and Wo Contrast  Result Date: 08/31/2022 CLINICAL DATA:  42 year old female with metastatic breast cancer. Treated initially with whole brain radiation in late 2022, and repeat salvage whole brain XRT in October 2023. Vasogenic edema. Neurologic deficit. EXAM: MRI HEAD WITHOUT AND WITH CONTRAST TECHNIQUE: Multiplanar, multiecho pulse sequences of the brain and surrounding structures were obtained without and with intravenous contrast. CONTRAST:  5mL GADAVIST GADOBUTROL 1 MMOL/ML IV SOLN COMPARISON:  CTA head and neck 0041 hours today. Most recent restaging brain MRI 07/04/2022. FINDINGS: Brain: No restricted diffusion or evidence of acute infarction. Four cerebellar enhancing brain metastases are all slightly smaller since 07/04/2022, ranging from punctate (right cerebellum series 18, image 21) up to 13 mm (left posterior cerebellum series 18, image 18). All lesions annotated on series 18. And cerebellar edema has resolved since 07/04/2022. Irregular, stellate and rim enhancing lesion at the left frontal lobe anterior operculum has enlarged since April, now 25 by 24 mm (AP by transverse compare series 18, image 67 to previous series 18, image 79, image 77 now to previous image 85). And again the possibility of leptomeningeal disease along the margins of this lesion is raised (series 19, image 18). Ongoing confluent T2/FLAIR hyperintense edema surrounding this area, and throughout the left hemisphere - see additional details below. Seven additional chronic enhancing metastases scattered in both cerebral hemispheres are all slightly smaller since 07/04/2022, annotated on series 18. However, there is a new 3-4 mm round focus of  enhancement in the left centrum semiovale series 18, image 94. And there is also a faint new curvilinear area of enhancement in the left parietal lobe near the periatrial white matter series 18, image 79. No other pathologic dural thickening. Confluent T2 and FLAIR hyperintensity in the left hemisphere continues and has progressed in the left superior frontal gyrus and superior parietal lobe on series 15, image 41, slightly more confluent elsewhere. There is mild but increased mass effect on the left lateral ventricle, and there is now 3 mm of rightward midline shift at the anterior septum pellucidum (series 15, image 30). No ventriculomegaly. No definite ependymal enhancement. Basilar cisterns remain patent. No extra-axial collection or acute intracranial hemorrhage. Cervicomedullary junction and pituitary are within normal limits. Vascular: Major intracranial vascular flow voids are stable. The major dural venous sinuses are enhancing and appear to be patent. Skull and upper cervical spine: Bone marrow signal remains within normal limits. Negative visible cervical spine and spinal cord. Sinuses/Orbits: Similar rightward gaze deviation. Paranasal sinuses remain clear. Other: Right mastoid effusion has not significantly changed. Negative visible scalp and face. IMPRESSION: 1. Mixed response to therapy since April: -11 enhancing metastases in both cerebral and cerebellar hemispheres are all slightly smaller. And cerebellar edema has resolved. - But the dominant left anterior frontal operculum metastasis with suspicion of associated leptomeningeal disease has increased from 20 mm in April  up to 25 mm now. - And there are one, possibly two subcentimeter NEW left hemisphere white matter metastases (series 18, images 79 and 94). 2. Ongoing extensive T2 and FLAIR hyperintensity most resembling tumoral edema in the left hemisphere, progressed in the superior left frontal and parietal lobes, and now associated with 3 mm of  rightward midline shift. 3. Negative for acute infarct. Electronically Signed   By: Odessa Fleming M.D.   On: 08/31/2022 04:12   DG Abdomen 1 View  Result Date: 08/31/2022 CLINICAL DATA:  Pre MRI clearance EXAM: ABDOMEN - 1 VIEW COMPARISON:  04/24/2022 FINDINGS: Vertebral augmentation is again noted at L4 and L1 as well as within the right sacrum and iliac bone. Bladder is filled with contrast material. No obstructive changes are seen. Prior pinning in the right hip is noted. No radiopaque foreign body is noted. IMPRESSION: No acute abnormality noted.  No findings to preclude MRI scan. Electronically Signed   By: Alcide Clever M.D.   On: 08/31/2022 03:29   DG Chest Portable 1 View  Result Date: 08/31/2022 CLINICAL DATA:  Pre MRI clearance EXAM: PORTABLE CHEST 1 VIEW COMPARISON:  02/05/2021 FINDINGS: Cardiac shadow is stable. Right chest wall port is again seen and stable. The overall inspiratory effort is poor. Changes of prior vertebral augmentation are noted in the midthoracic spine. IMPRESSION: No acute abnormality noted. No retained foreign body to preclude MRI scanning. Electronically Signed   By: Alcide Clever M.D.   On: 08/31/2022 03:28   CT ANGIO HEAD NECK W WO CM  Result Date: 08/31/2022 CLINICAL DATA:  Initial evaluation for neuro deficit, stroke suspected. EXAM: CT ANGIOGRAPHY HEAD AND NECK WITH AND WITHOUT CONTRAST TECHNIQUE: Multidetector CT imaging of the head and neck was performed using the standard protocol during bolus administration of intravenous contrast. Multiplanar CT image reconstructions and MIPs were obtained to evaluate the vascular anatomy. Carotid stenosis measurements (when applicable) are obtained utilizing NASCET criteria, using the distal internal carotid diameter as the denominator. RADIATION DOSE REDUCTION: This exam was performed according to the departmental dose-optimization program which includes automated exposure control, adjustment of the mA and/or kV according to patient  size and/or use of iterative reconstruction technique. CONTRAST:  75mL OMNIPAQUE IOHEXOL 350 MG/ML SOLN COMPARISON:  Comparison made with prior CT from 08/30/2022. FINDINGS: CTA NECK FINDINGS Aortic arch: Visualized aortic arch normal caliber with standard branch pattern. No stenosis about the origin the great vessels. Right carotid system: Right common and internal carotid arteries are patent without stenosis or dissection. Left carotid system: Left common and internal carotid arteries are patent without stenosis or dissection. Vertebral arteries: Both vertebral arteries arise from subclavian arteries. No proximal subclavian artery stenosis. Right vertebral artery slightly dominant. Vertebral arteries patent without stenosis or dissection. Skeleton: Few scattered abnormal sclerotic lesion seen involving the visualized osseous structures, consistent with known osseous metastatic disease. Associated mild pathologic compression deformity at the inferior endplate of C5 without retropulsion. Other neck: No other acute finding. Upper chest: No other acute finding. Review of the MIP images confirms the above findings CTA HEAD FINDINGS Anterior circulation: Both internal carotid arteries are widely patent to the termini without stenosis or other abnormality. Left A1 segment widely patent. Right A1 hypoplastic and/or absent. Normal anterior communicating complex. Anterior cerebral arteries deviated to the right but are widely patent without stenosis. No M1 stenosis or occlusion. No proximal MCA branch occlusion or high-grade stenosis. Distal MCA branches perfused and symmetric. Posterior circulation: Both V4 segments widely patent without  stenosis. Both PICA patent. Basilar widely patent without stenosis. Superior cerebral arteries patent bilaterally. Left PCA primarily supplied via the basilar. Predominant fetal type origin of the right PCA. Both PCAs patent without stenosis. Venous sinuses: Patent allowing for timing the  contrast bolus. Anatomic variants: Hypoplastic/absent right A1. Mildly dominant right vertebral artery. No intracranial aneurysm. Vasogenic edema involving the left greater than right cerebral hemispheres related to known metastatic disease, better characterized on prior CT. Review of the MIP images confirms the above findings IMPRESSION: 1. Negative CTA of the head and neck. No large vessel occlusion or other emergent finding. No hemodynamically significant or correctable stenosis. 2. Intracranial metastatic disease with associated vasogenic edema and mild left-to-right shift. Findings are better characterized on prior exams. 3. Scattered osseous metastatic disease. Associated mild pathologic compression deformity at the inferior endplate of C5 without retropulsion. Electronically Signed   By: Rise Mu M.D.   On: 08/31/2022 02:21   CT HEAD WO CONTRAST ( )  Result Date: 08/30/2022 CLINICAL DATA:  Neuro deficit, acute, stroke suspected new aphasia, pt with known brain mets EXAM: CT HEAD WITHOUT CONTRAST TECHNIQUE: Contiguous axial images were obtained from the base of the skull through the vertex without intravenous contrast. RADIATION DOSE REDUCTION: This exam was performed according to the departmental dose-optimization program which includes automated exposure control, adjustment of the mA and/or kV according to patient size and/or use of iterative reconstruction technique. COMPARISON:  06/12/2018, MRI 07/04/2022 FINDINGS: Brain: Extensive vasogenic edema throughout the left frontal lobe, extensive vasogenic edema throughout the cerebral hemispheres bilaterally, left greater than right, similar to prior study. The previously seen brain lesions by MRI not appreciated by noncontrast CT. No midline shift or hydrocephalus. No hemorrhage. Vascular: No hyperdense vessel or unexpected calcification. Skull: No acute calvarial abnormality. Sinuses/Orbits: No acute findings Other: None IMPRESSION:  Extensive vasogenic edema as seen on prior study and compatible with the known metastatic disease present on prior imaging. This appears similar to prior study. Previously seen individual metastatic lesions by prior MRI not well visualized on this noncontrast CT. No hemorrhage, hydrocephalus or midline shift. Electronically Signed   By: Charlett Nose M.D.   On: 08/30/2022 23:46    PERFORMANCE STATUS (ECOG) : 3 - Symptomatic, >50% confined to bed  Review of Systems Unable to complete  Physical Exam General: Frail appearing Cardiovascular: regular rate and rhythm Pulmonary: clear ant fields Abdomen: soft, nontender, + bowel sounds GU: no suprapubic tenderness Extremities: no edema, no joint deformities Skin: no rashes Neurological: Nods head affirmatively in response to questions  IMPRESSION: Patient is well-known to me from the clinic.  She was admitted today with altered mental status with MRI of the brain concerning for progression of brain metastases and findings concerning for leptomeningeal spread and vasogenic edema with rightward midline shift.  Patient has been started on steroids.  Will prophylactically cover with Keppra to prevent seizures.  Patient is currently comfortable appearing.  She is normally able to communicate well but currently just nods her head affirmatively to any questions even if such responses are inappropriate.  She does not currently appear to have capacity for decision-making.  I met with patient's 33 year old son to discuss goals.  Of note, patient is not married nor does she have advance directives or healthcare power of attorney.  Patient has previously resisted these conversations/planning.  Patient's son says he recognizes the patient is nearing end-of-life.  We discussed the role of hospice and ensuring that patient is comfortable.  Without clinical improvement, she  would not be a candidate for further treatment.  She would likely require inpatient hospice  given limited support at home.  Will await to see if patient has any improvement on steroids prior to making decisions about disposition.  Son says that patient has discussed CODE STATUS with him and that she would not want to "suffer" or have any aggressive life prolonging measures that were futile.  He verbalized agreement with DNR/DNI and this was witnessed by patient's RN.  Patient's son has many questions about life following patient's death.  He has been working a part-time job and was the primary financial support for patient and his siblings over these past months.  Patient has two other sons ages 79 and 16 and he plans to reach out to family to discuss their care.  Will consult social work to discuss available resources.   PLAN: -Continue current scope of treatment -Continue dexamethasone 4 mg IV every 8 hours -Start prophylactic Keppra -SLP/SW consult -DNR/DNI -Dispo uncertain but likely hospice if no significant improvement  Case and plan discussed with [name]   Time Total: 70 minutes  Visit consisted of counseling and education dealing with the complex and emotionally intense issues of symptom management and palliative care in the setting of serious and potentially life-threatening illness.Greater than 50%  of this time was spent counseling and coordinating care related to the above assessment and plan.  Signed by: Laurette Schimke, PhD, NP-C

## 2022-08-31 NOTE — Progress Notes (Signed)
   08/31/22 1400  Spiritual Encounters  Type of Visit Initial  Care provided to: Pt and family  Conversation partners present during encounter Nurse  Referral source Nurse (RN/NT/LPN)  Reason for visit End-of-life  OnCall Visit Yes  Spiritual Framework  Presenting Themes Goals in life/care;Caregiving needs;Impactful experiences and emotions;Courage hope and growth  Community/Connection Family;Friend(s);Faith community  Patient Stress Factors Major life changes  Family Stress Factors None identified  Interventions  Spiritual Care Interventions Made Established relationship of care and support;Compassionate presence;Reflective listening;Bereavement/grief support;Meaning making;Encouragement;Prayer  Intervention Outcomes  Outcomes Connection to spiritual care;Awareness around self/spiritual resourses;Awareness of health  Spiritual Care Plan  Spiritual Care Issues Still Outstanding Chaplain will continue to follow   Chaplain went to see patient to for spiritual consult. Patient just received medicine when I came to visit and was falling to sleep. Chaplain spoke with the patients 52 year old son. He is the eldest in the family and is lamenting that his mother is dying. Son is also concerned with what will happen with his younger siblings once his mother passes on. Nurse informed him that a SW will be by to talk with him. Chaplain offered words of encouragement and hope to the patients son. Chaplain also went for a walk with him and prayed with him.

## 2022-08-31 NOTE — Progress Notes (Signed)
Prior-To-Admission Oral Chemotherapy for Treatment of Oncologic Disease   Order noted from Dr. Arville Care to continue prior-to-admission oral chemotherapy regimen of Xeloda and Tukysa.  Procedure Per Pharmacy & Therapeutics Committee Policy: Orders for continuation of home oral chemotherapy for treatment of an oncologic disease will be held unless approved by an oncologist during current admission.    For patients receiving oncology care at [facility], inpatient pharmacist contacts patient's oncologist during regular office hours to review. If earlier review is medically necessary, attending physician consults [facility] on-call oncologist   For patients receiving oncology care outside of [facility], attending physician consults patient's oncologist to review. If this oncologist or their coverage cannot be reached, attending physician consults [facility] on-call oncologist   Oral chemotherapy continuation order is on hold pending oncologist review, [facility] oncologist [name] will be notified by inpatient pharmacy during office hours   Drusilla Kanner, PharmD, MBA

## 2022-08-31 NOTE — Progress Notes (Signed)
Initial Nutrition Assessment  DOCUMENTATION CODES:   Not applicable  INTERVENTION:   -Obtain new wt -Once diet is advanced, add:   -Ensure Enlive po BID, each supplement provides 350 kcal and 20 grams of protein -MVI with minerals daily  NUTRITION DIAGNOSIS:   Increased nutrient needs related to cancer and cancer related treatments as evidenced by estimated needs.  GOAL:   Patient will meet greater than or equal to 90% of their needs  MONITOR:   PO intake, Supplement acceptance, Diet advancement  REASON FOR ASSESSMENT:   Consult Assessment of nutrition requirement/status  ASSESSMENT:   Pt with PMH of metastatic breast cancer who is admitted with aphasia.  Pt admitted with aphasia.    Per ED notes, pt with word finding difficulty for 1 day OTA. She was advised to go to the ED for evaluation secondary to neurological changes.   Pt followed by cancer center and palliative care. 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 Pt has had poor oral intake and has been followed by RD at [facility].   Pt lying in bed at time of visit. Pt is drowsy, but nodded her head when asked if she was feeling better. Family member asleep at bedside. Unable to obtain history at this time. Pt currently NPO.   Last wt was 54.4 kg on 08/11/22. Pt has lost 7% over the past 3 months since this time frame, which is not significant for time frame. RD will obtain new wt to better assess changes.   Due to history of poor oral intake, pt would greatly beenfit from addition of oral nutrition supplements once diet is advanced.   Medications reviewed and include calcium with vitamin D, neurontin, ms contin, protonix, and 0.9% sodium chloride infusion @ 100 ml/hr.   Labs reviewed: CBGS: 86.    NUTRITION - FOCUSED PHYSICAL EXAM:  Flowsheet Row Most Recent Value  Orbital Region Mild depletion  Upper Arm Region No depletion  Thoracic and Lumbar Region No  depletion  Buccal Region No depletion  Temple Region Mild depletion  Clavicle Bone Region No depletion  Clavicle and Acromion Bone Region No depletion  Scapular Bone Region No depletion  Dorsal Hand Mild depletion  Patellar Region No depletion  Anterior Thigh Region No depletion  Posterior Calf Region No depletion  Edema (RD Assessment) Mild  Hair Reviewed  Eyes Reviewed  Mouth Reviewed  Skin Reviewed  Nails Reviewed       Diet Order:   Diet Order             Diet NPO time specified Except for: Sips with Meds, Ice Chips  Diet effective now                   EDUCATION NEEDS:   Not appropriate for education at this time  Skin:  Skin Assessment: Reviewed RN Assessment  Last BM:  08/29/22  Height:   Ht Readings from Last 1 Encounters:  08/11/22 4\' 11"  (1.499 m)    Weight:   Wt Readings from Last 1 Encounters:  08/11/22 54.4 kg    Ideal Body Weight:  44.6 kg  BMI:  There is no height or weight on file to calculate BMI.  Estimated Nutritional Needs:   Kcal:  1650-1850  Protein:  80-95 grams  Fluid:  > 1.6 L    Levada Schilling, RD, LDN, CDCES Registered Dietitian II Certified Diabetes Care and Education Specialist Please refer to [facility] for RD and/or  RD on-call/weekend/after hours pager

## 2022-08-31 NOTE — Progress Notes (Addendum)
SLP Cancellation Note  Patient Details Name: Ewelina Naves MRN: [id number] DOB: 07-22-1980   Cancelled treatment:       Reason Eval/Treat Not Completed: Medical issues which prohibited therapy;Patient not medically ready (chart reviewed; consulted MD and Palliative Care, [name] Borders, re: pt's status and prognosis.) Pt is Spanish speaking and requires an Equities trader.  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 Patient is status post whole brain radiation. 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 Palliative Care at the [facility] has been following.   Patient is now admitted 08/31/2022 with altered mental status. A repeat MRI of the brain this admit shows: Mixed response to therapy since April: -11 enhancing metastases in both cerebral and cerebellar hemispheres are all slightly smaller. And cerebellar edema has resolved. 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 - And there are one, possibly two subcentimeter NEW left hemisphere white matter metastases (series 18, images 79 and 94).   2. Ongoing extensive T2 and FLAIR hyperintensity most resembling tumoral edema in the left hemisphere, progressed in the superior left frontal and parietal lobes, and now associated with 3 mm of rightward midline shift.   3. Negative for acute infarct.".    Per consultation w/ NSG, pt is currently having a fluctuating cognitive-linguistic presentation and also difficulty w/ swallowing Pills -- she is holding oral medications and will monitor all po intake attempted(meals). An oral diet order is active per MD order.   Discussed w/ Team members(MD, NSG, Palliative Care) that a cognitive-language evaluation today would not be appropriate d/t pt's Acuity  of illness and ongoing fluctuating status. MD stated pt is to receive Steroid tx; per Oncology: "prognosis is poor. There is no role for surgery here or radiation. Most of the edema is chronic.".   ST services will monitor pt's status and be available when pt is appropriate for assessment -- this may take the form of f/u in the next venue of care. Both MD and Palliative Care agreed. NSG updated.   Discussed w/ NSG to utilize [employer] and offer F:1-2 choices, repeat to clarify information, and reduce distractions during conversation. Recommend aspiration precautions. NSG agreed.      Jerilynn Som, MS, CCC-SLP Speech Language Pathologist Rehab Services; [facility] [phone] (ascom) Nivin Braniff 08/31/2022, 3:49 PM

## 2022-08-31 NOTE — H&P (Signed)
History and Physical    Amry Cathy ZOX:[id number] DOB: Sep 17, 1980 DOA: 08/30/2022  Referring MD/NP/PA:   PCP: Creig Hines, MD   Patient coming from:  The patient is coming from home.     Chief Complaint: aphasia and AMS  HPI: Sophia [name] is a 42 y.o. 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    History is obtained with Spanish interpreter at the bedside. Patient has aphasia and confusion, and is unable to provide accurate medical history. Per her son at bedside, pt was found to have confusion and increased difficulty speaking since yesterday afternoon. She moves all extremities, no facial droop. When I saw pt on the floor, pt is alert, but confused, partially following commands.  She has difficulty speaking.  She seems to be unable to completely understand the conversation.  No respiratory breath, cough, nausea, vomiting or diarrhea noted.  She does not seem to have chest pain or abdominal pain per her son.  Data reviewed independently and ED Course: pt was found to have WBC 2.9, GFR> 60, temperature normal, blood pressure 112/73, heart rate of 83, RR 18, oxygen saturation 100% on room air. CTA of head and neck negative for LVO. Patient is admitted to tele as inpatient. [name] of oncology is consulted.  MRI-brain w/ and w/o contrast: 1. Mixed response to therapy since April: -11 enhancing metastases in both cerebral and cerebellar hemispheres are all slightly smaller. And cerebellar edema has resolved. 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 - And there are one, possibly two subcentimeter NEW left hemisphere white matter metastases (series 18, images 79 and 94).   2. Ongoing extensive T2 and FLAIR  hyperintensity most resembling tumoral edema in the left hemisphere, progressed in the superior left frontal and parietal lobes, and now associated with 3 mm of rightward midline shift.   3. Negative for acute infarct.  CT-head: Extensive vasogenic edema as seen on prior study and compatible with the known metastatic disease present on prior imaging. This appears similar to prior study. Previously seen individual metastatic lesions by prior MRI not well visualized on this noncontrast CT.   No hemorrhage, hydrocephalus or midline shift.  EKG: I have personally reviewed.  Sinus rhythm, QTc 500, QT wave in lead III, low voltage, early R wave progression   Review of Systems: could not be reviewed due to altered mental status.   Allergy: No Known Allergies  Past Medical History:  Diagnosis Date   Anxiety    Breast cancer (HCC)    with mets   Cancer (HCC)    Colitis    COVID-19 in immunocompromised patient (HCC)    Family history of colon cancer    Vertigo     Past Surgical History:  Procedure Laterality Date   BREAST BIOPSY Left 08/14/2019   [place] bx of mass, coil marker, path pending   BREAST BIOPSY Left 08/14/2019   [place] bx of LN, hydromarker, path pending   BREAST BIOPSY Left 08/14/2019   affirm bx of calcs, x marker, path pending   ESOPHAGOGASTRODUODENOSCOPY (EGD) WITH PROPOFOL N/A 10/05/2019   Procedure: ESOPHAGOGASTRODUODENOSCOPY (EGD) WITH PROPOFOL;  Surgeon: Toney Reil, MD;  Location: [facility] ENDOSCOPY;  Service: Gastroenterology;  Laterality: N/A;   FLEXIBLE SIGMOIDOSCOPY N/A 10/05/2019  Procedure: FLEXIBLE SIGMOIDOSCOPY;  Surgeon: Toney Reil, MD;  Location: [facility] ENDOSCOPY;  Service: Gastroenterology;  Laterality: N/A;   INTRAMEDULLARY (IM) NAIL INTERTROCHANTERIC Right 09/01/2019   Procedure: INTRAMEDULLARY (IM) NAIL INTERTROCHANTRIC AND RADIOFREQUENCY ABLATION;  Surgeon: Kennedy Bucker, MD;  Location: [facility] ORS;  Service: Orthopedics;  Laterality: Right;    KYPHOPLASTY N/A 08/29/2019   Procedure: KYPHOPLASTY T6, L1,L4 ,  RADIOFREQUENCY ABLATION;  Surgeon: Kennedy Bucker, MD;  Location: [facility] ORS;  Service: Orthopedics;  Laterality: N/A;   KYPHOPLASTY Right 09/01/2019   Procedure: Right Sacral Radiofrequency Ablation and Cement Augmentation, Right sacrum and iliac crest;  Surgeon: Kennedy Bucker, MD;  Location: [facility] ORS;  Service: Orthopedics;  Laterality: Right;   PORTA CATH INSERTION N/A 08/28/2019   Procedure: PORTA CATH INSERTION;  Surgeon: Annice Needy, MD;  Location: [facility] INVASIVE CV LAB;  Service: Cardiovascular;  Laterality: N/A;    Social History:  reports that she has never smoked. She has never used smokeless tobacco. She reports that she does not currently use alcohol. She reports that she does not currently use drugs.  Family History:  Family History  Problem Relation Age of Onset   Colon cancer Maternal Uncle      Prior to Admission medications   Medication Sig Start Date End Date Taking? Authorizing Provider  Calcium Carb-Cholecalciferol 600-10 MG-MCG TABS Take 1 tablet by mouth 2 (two) times daily. 03/31/22   Creig Hines, MD  capecitabine (XELODA) 500 MG tablet Take 3 tablets (1,500 mg total) by mouth 2 (two) times daily after a meal. Take for 14 days, then hold for 7 days. Repeat every 21 days. 06/06/22   Remi Haggard, RPH-CPP  DULoxetine (CYMBALTA) 60 MG capsule Take 1 capsule (60 mg total) by mouth daily. 07/11/22   Creig Hines, MD  gabapentin (NEURONTIN) 600 MG tablet Take 1 tablet (600 mg total) by mouth 3 (three) times daily. 07/11/22   Creig Hines, MD  HYDROmorphone (DILAUDID) 2 MG tablet Take 0.5-1 tablets (1-2 mg total) by mouth every 4 (four) hours as needed for severe pain. 07/28/22   Creig Hines, MD  lidocaine-prilocaine (EMLA) cream Apply 1 Application topically as needed. Apply small amount to port site at least 1 hour prior to it being accessed, cover with plastic wrap 04/24/22   Creig Hines, MD  loratadine  (CLARITIN) 10 MG tablet Take 1 tablet (10 mg total) by mouth daily. For seasonal allergies 06/27/22   Alinda Dooms, NP  LORazepam (ATIVAN) 0.5 MG tablet Take 1 tablet (0.5 mg total) by mouth every 6 (six) hours as needed for anxiety (and nausea). AS NEEDED FOR NAUSEA 07/11/22   Creig Hines, MD  morphine (MS CONTIN) 30 MG 12 hr tablet Take 1 tablet (30 mg total) by mouth every 12 (twelve) hours. 07/28/22   Creig Hines, MD  nitrofurantoin, macrocrystal-monohydrate, (MACROBID) 100 MG capsule Take 1 capsule (100 mg total) by mouth 2 (two) times daily. 06/26/22   Creig Hines, MD  OLANZapine (ZYPREXA) 10 MG tablet Take 1 tablet (10 mg total) by mouth at bedtime as needed (nausea). 10/14/21   Borders, Daryl Eastern, NP  omeprazole (PRILOSEC) 20 MG capsule Take 1 capsule (20 mg total) by mouth daily. 05/18/22   Creig Hines, MD  ondansetron (ZOFRAN) 8 MG tablet Take 1 tablet (8 mg total) by mouth every 8 (eight) hours as needed for nausea or vomiting. 06/07/22   Rushie Chestnut, PA-C  TUKYSA 150 MG tablet Take 2  tablets (300mg ) by mouth twice daily as directed by physician. 06/26/22   Creig Hines, MD    Physical Exam: Vitals:   08/31/22 0300 08/31/22 0442 08/31/22 0757 08/31/22 1621  BP:  112/73 (!) 97/58 125/74  Pulse:  (!) 53 61 (!) 51  Resp:  18 18 17   Temp: 98.8 F (37.1 C) 98.4 F (36.9 C) 98.3 F (36.8 C) 98.3 F (36.8 C)  TempSrc: Oral Oral    SpO2:  100% 98% 100%   General: Not in acute distress HEENT:       Eyes: PERRL, EOMI, no jaundice       ENT: No discharge from the ears and nose, no pharynx injection, no tonsillar enlargement.        Neck: No JVD, no bruit, no mass felt. Heme: No neck lymph node enlargement. Cardiac: S1/S2, RRR, No murmurs, No gallops or rubs. Respiratory: No rales, wheezing, rhonchi or rubs. GI: Soft, nondistended, nontender, no rebound pain, no organomegaly, BS present. GU: No hematuria Ext: No pitting leg edema bilaterally. 1+DP/PT pulse  bilaterally. Musculoskeletal: No joint deformities, No joint redness or warmth, no limitation of ROM in spin. Skin: No rashes.  Neuro: confused,  has difficulty speaking, partially following commands, cranial nerves II-XII grossly intact, moves all extremities normally.   Psych: Patient is not psychotic, no suicidal or hemocidal ideation.  Labs on Admission: I have personally reviewed following labs and imaging studies  CBC: Recent Labs  Lab 08/30/22 2357 08/31/22 0512  WBC 2.9* 2.5*  NEUTROABS 1.4*  --   HGB 12.1 11.3*  HCT 37.6 34.5*  MCV 85.1 82.7  PLT 458* 451*   Basic Metabolic Panel: Recent Labs  Lab 08/30/22 2357 08/31/22 0512  NA 138 139  K 3.8 3.8  CL 104 108  CO2 23 23  GLUCOSE 89 96  BUN 8 7  CREATININE 0.53 0.45  CALCIUM 8.7* 8.8*  MG 2.3  --    GFR: CrCl cannot be calculated (Unknown ideal weight.). Liver Function Tests: Recent Labs  Lab 08/30/22 2357  AST 24  ALT 14  ALKPHOS 73  BILITOT 0.6  PROT 7.3  ALBUMIN 3.8   No results for input(s): "LIPASE", "AMYLASE" in the last 168 hours. No results for input(s): "AMMONIA" in the last 168 hours. Coagulation Profile: Recent Labs  Lab 08/30/22 2357  INR 1.1   Cardiac Enzymes: No results for input(s): "CKTOTAL", "CKMB", "CKMBINDEX", "TROPONINI" in the last 168 hours. BNP (last 3 results) No results for input(s): "PROBNP" in the last 8760 hours. HbA1C: No results for input(s): "HGBA1C" in the last 72 hours. CBG: No results for input(s): "GLUCAP" in the last 168 hours. Lipid Profile: No results for input(s): "CHOL", "HDL", "LDLCALC", "TRIG", "CHOLHDL", "LDLDIRECT" in the last 72 hours. Thyroid Function Tests: No results for input(s): "TSH", "T4TOTAL", "FREET4", "T3FREE", "THYROIDAB" in the last 72 hours. Anemia Panel: No results for input(s): "VITAMINB12", "FOLATE", "FERRITIN", "TIBC", "IRON", "RETICCTPCT" in the last 72 hours. Urine analysis:    Component Value Date/Time   COLORURINE STRAW  (A) 06/23/2022 1235   APPEARANCEUR CLEAR (A) 06/23/2022 1235   LABSPEC 1.003 (L) 06/23/2022 1235   PHURINE 6.0 06/23/2022 1235   GLUCOSEU NEGATIVE 06/23/2022 1235   HGBUR NEGATIVE 06/23/2022 1235   BILIRUBINUR NEGATIVE 06/23/2022 1235   KETONESUR NEGATIVE 06/23/2022 1235   PROTEINUR NEGATIVE 06/23/2022 1235   NITRITE NEGATIVE 06/23/2022 1235   LEUKOCYTESUR LARGE (A) 06/23/2022 1235   Sepsis Labs: @LABRCNTIP (procalcitonin:4,lacticidven:4) )No results found for this or any previous  visit (from the past 240 hour(s)).   Radiological Exams on Admission: MR Brain W and Wo Contrast  Result Date: 08/31/2022 CLINICAL DATA:  42 year old female with metastatic breast cancer. Treated initially with whole brain radiation in late 2022, and repeat salvage whole brain XRT in October 2023. Vasogenic edema. Neurologic deficit. EXAM: MRI HEAD WITHOUT AND WITH CONTRAST TECHNIQUE: Multiplanar, multiecho pulse sequences of the brain and surrounding structures were obtained without and with intravenous contrast. CONTRAST:  5mL GADAVIST GADOBUTROL 1 MMOL/ML IV SOLN COMPARISON:  CTA head and neck 0041 hours today. Most recent restaging brain MRI 07/04/2022. FINDINGS: Brain: No restricted diffusion or evidence of acute infarction. Four cerebellar enhancing brain metastases are all slightly smaller since 07/04/2022, ranging from punctate (right cerebellum series 18, image 21) up to 13 mm (left posterior cerebellum series 18, image 18). All lesions annotated on series 18. And cerebellar edema has resolved since 07/04/2022. Irregular, stellate and rim enhancing lesion at the left frontal lobe anterior operculum has enlarged since April, now 25 by 24 mm (AP by transverse compare series 18, image 67 to previous series 18, image 79, image 77 now to previous image 85). And again the possibility of leptomeningeal disease along the margins of this lesion is raised (series 19, image 18). Ongoing confluent T2/FLAIR hyperintense edema  surrounding this area, and throughout the left hemisphere - see additional details below. Seven additional chronic enhancing metastases scattered in both cerebral hemispheres are all slightly smaller since 07/04/2022, annotated on series 18. However, there is a new 3-4 mm round focus of enhancement in the left centrum semiovale series 18, image 94. And there is also a faint new curvilinear area of enhancement in the left parietal lobe near the periatrial white matter series 18, image 79. No other pathologic dural thickening. Confluent T2 and FLAIR hyperintensity in the left hemisphere continues and has progressed in the left superior frontal gyrus and superior parietal lobe on series 15, image 41, slightly more confluent elsewhere. There is mild but increased mass effect on the left lateral ventricle, and there is now 3 mm of rightward midline shift at the anterior septum pellucidum (series 15, image 30). No ventriculomegaly. No definite ependymal enhancement. Basilar cisterns remain patent. No extra-axial collection or acute intracranial hemorrhage. Cervicomedullary junction and pituitary are within normal limits. Vascular: Major intracranial vascular flow voids are stable. The major dural venous sinuses are enhancing and appear to be patent. Skull and upper cervical spine: Bone marrow signal remains within normal limits. Negative visible cervical spine and spinal cord. Sinuses/Orbits: Similar rightward gaze deviation. Paranasal sinuses remain clear. Other: Right mastoid effusion has not significantly changed. Negative visible scalp and face. IMPRESSION: 1. Mixed response to therapy since April: -11 enhancing metastases in both cerebral and cerebellar hemispheres are all slightly smaller. And cerebellar edema has resolved. 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 - And there are one, possibly  two subcentimeter NEW left hemisphere white matter metastases (series 18, images 79 and 94). 2. Ongoing extensive T2 and FLAIR hyperintensity most resembling tumoral edema in the left hemisphere, progressed in the superior left frontal and parietal lobes, and now associated with 3 mm of rightward midline shift. 3. Negative for acute infarct. Electronically Signed   By: Odessa Fleming M.D.   On: 08/31/2022 04:12   DG Abdomen 1 View  Result Date: 08/31/2022 CLINICAL DATA:  Pre MRI clearance EXAM:  ABDOMEN - 1 VIEW COMPARISON:  04/24/2022 FINDINGS: Vertebral augmentation is again noted at L4 and L1 as well as within the right sacrum and iliac bone. Bladder is filled with contrast material. No obstructive changes are seen. Prior pinning in the right hip is noted. No radiopaque foreign body is noted. IMPRESSION: No acute abnormality noted.  No findings to preclude MRI scan. Electronically Signed   By: Alcide Clever M.D.   On: 08/31/2022 03:29   DG Chest Portable 1 View  Result Date: 08/31/2022 CLINICAL DATA:  Pre MRI clearance EXAM: PORTABLE CHEST 1 VIEW COMPARISON:  02/05/2021 FINDINGS: Cardiac shadow is stable. Right chest wall port is again seen and stable. The overall inspiratory effort is poor. Changes of prior vertebral augmentation are noted in the midthoracic spine. IMPRESSION: No acute abnormality noted. No retained foreign body to preclude MRI scanning. Electronically Signed   By: Alcide Clever M.D.   On: 08/31/2022 03:28   CT ANGIO HEAD NECK W WO CM  Result Date: 08/31/2022 CLINICAL DATA:  Initial evaluation for neuro deficit, stroke suspected. EXAM: CT ANGIOGRAPHY HEAD AND NECK WITH AND WITHOUT CONTRAST TECHNIQUE: Multidetector CT imaging of the head and neck was performed using the standard protocol during bolus administration of intravenous contrast. Multiplanar CT image reconstructions and MIPs were obtained to evaluate the vascular anatomy. Carotid stenosis measurements (when applicable) are obtained  utilizing NASCET criteria, using the distal internal carotid diameter as the denominator. RADIATION DOSE REDUCTION: This exam was performed according to the departmental dose-optimization program which includes automated exposure control, adjustment of the mA and/or kV according to patient size and/or use of iterative reconstruction technique. CONTRAST:  75mL OMNIPAQUE IOHEXOL 350 MG/ML SOLN COMPARISON:  Comparison made with prior CT from 08/30/2022. FINDINGS: CTA NECK FINDINGS Aortic arch: Visualized aortic arch normal caliber with standard branch pattern. No stenosis about the origin the great vessels. Right carotid system: Right common and internal carotid arteries are patent without stenosis or dissection. Left carotid system: Left common and internal carotid arteries are patent without stenosis or dissection. Vertebral arteries: Both vertebral arteries arise from subclavian arteries. No proximal subclavian artery stenosis. Right vertebral artery slightly dominant. Vertebral arteries patent without stenosis or dissection. Skeleton: Few scattered abnormal sclerotic lesion seen involving the visualized osseous structures, consistent with known osseous metastatic disease. Associated mild pathologic compression deformity at the inferior endplate of C5 without retropulsion. Other neck: No other acute finding. Upper chest: No other acute finding. Review of the MIP images confirms the above findings CTA HEAD FINDINGS Anterior circulation: Both internal carotid arteries are widely patent to the termini without stenosis or other abnormality. Left A1 segment widely patent. Right A1 hypoplastic and/or absent. Normal anterior communicating complex. Anterior cerebral arteries deviated to the right but are widely patent without stenosis. No M1 stenosis or occlusion. No proximal MCA branch occlusion or high-grade stenosis. Distal MCA branches perfused and symmetric. Posterior circulation: Both V4 segments widely patent without  stenosis. Both PICA patent. Basilar widely patent without stenosis. Superior cerebral arteries patent bilaterally. Left PCA primarily supplied via the basilar. Predominant fetal type origin of the right PCA. Both PCAs patent without stenosis. Venous sinuses: Patent allowing for timing the contrast bolus. Anatomic variants: Hypoplastic/absent right A1. Mildly dominant right vertebral artery. No intracranial aneurysm. Vasogenic edema involving the left greater than right cerebral hemispheres related to known metastatic disease, better characterized on prior CT. Review of the MIP images confirms the above findings IMPRESSION: 1. Negative CTA of the head and neck. No  large vessel occlusion or other emergent finding. No hemodynamically significant or correctable stenosis. 2. Intracranial metastatic disease with associated vasogenic edema and mild left-to-right shift. Findings are better characterized on prior exams. 3. Scattered osseous metastatic disease. Associated mild pathologic compression deformity at the inferior endplate of C5 without retropulsion. Electronically Signed   By: Rise Mu M.D.   On: 08/31/2022 02:21   CT HEAD WO CONTRAST ( )  Result Date: 08/30/2022 CLINICAL DATA:  Neuro deficit, acute, stroke suspected new aphasia, pt with known brain mets EXAM: CT HEAD WITHOUT CONTRAST TECHNIQUE: Contiguous axial images were obtained from the base of the skull through the vertex without intravenous contrast. RADIATION DOSE REDUCTION: This exam was performed according to the departmental dose-optimization program which includes automated exposure control, adjustment of the mA and/or kV according to patient size and/or use of iterative reconstruction technique. COMPARISON:  06/12/2018, MRI 07/04/2022 FINDINGS: Brain: Extensive vasogenic edema throughout the left frontal lobe, extensive vasogenic edema throughout the cerebral hemispheres bilaterally, left greater than right, similar to prior study.  The previously seen brain lesions by MRI not appreciated by noncontrast CT. No midline shift or hydrocephalus. No hemorrhage. Vascular: No hyperdense vessel or unexpected calcification. Skull: No acute calvarial abnormality. Sinuses/Orbits: No acute findings Other: None IMPRESSION: Extensive vasogenic edema as seen on prior study and compatible with the known metastatic disease present on prior imaging. This appears similar to prior study. Previously seen individual metastatic lesions by prior MRI not well visualized on this noncontrast CT. No hemorrhage, hydrocephalus or midline shift. Electronically Signed   By: Charlett Nose M.D.   On: 08/30/2022 23:46      Assessment/Plan Principal Problem:   Aphasia Active Problems:   Acute metabolic encephalopathy   Primary cancer of left breast with metastasis to other site [facility])   Breast cancer metastasized to brain [facility])   Neoplasm related pain   GERD (gastroesophageal reflux disease)   Depression with anxiety   Palliative care encounter   Assessment and Plan:  Aphasia and acute metabolic encephalopathy: this is due to brain metastasized disease.  Pt has 3 mm of rightward midline shift by MRI-brain. Consulted [name] of oncology. Per [name], overall prognosis is poor.  There is no role for surgery or radiation.  Most of brain edema is chronic.  She recommended to start Decadron 4 mg 3 times daily IV.  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  We canceled chemotherapy. - [name] will see pt tomorrow  Neoplasm related pain -As needed Dilaudid IV, Percocet -MS Contin 30 mg twice  dail  GERD (gastroesophageal reflux disease) -Protonix  Depression with anxiety -Continue home medications   DVT ppx: SQ Heparin      Code Status: DNR   Family Communication: Yes, patient's son at bed side.    Disposition Plan:  to be determined, likely hospice care  Consults called:  [name] of oncology  Admission status and Level of care: Telemetry Medical:    as inpt     Dispo: The patient is from: Home              Anticipated d/c is to:  to be determined              Anticipated d/c date is: 2 days              Patient currently is not medically stable to d/c.    Severity of Illness:  The appropriate patient status for this patient is INPATIENT. Inpatient status is judged to be reasonable and necessary in order to provide the required intensity of service to ensure the patient's safety. The patient's presenting symptoms, physical exam findings, and initial radiographic and laboratory data in the context of their chronic comorbidities is felt to place them at high risk for further clinical deterioration. Furthermore, it is not anticipated that the patient will be medically stable for discharge from the hospital within 2 midnights of admission.   * I certify that at the point of admission it is my clinical judgment that the patient will require inpatient hospital care spanning beyond 2 midnights from the point of admission due to high intensity of service, high risk for further deterioration and high frequency of surveillance required.*       Date of Service 08/31/2022    Lorretta Harp [facility]   If 7PM-7AM, please contact night-coverage [email or web address] 08/31/2022, 5:53 PM

## 2022-08-31 NOTE — Telephone Encounter (Signed)
Per the request of [name] and Elouise Munroe, [facility] CSW attempted to contact patient's adult son to provide resources regarding his younger siblings.  CSW left a message for her contact and attempted to phone patient's room where son was previously.  Also informed Allena Katz, LCSW, hospital social worker of provider's request.

## 2022-09-01 ENCOUNTER — Inpatient Hospital Stay: Payer: Self-pay

## 2022-09-01 ENCOUNTER — Inpatient Hospital Stay: Payer: Self-pay | Admitting: Hospice and Palliative Medicine

## 2022-09-01 ENCOUNTER — Inpatient Hospital Stay: Payer: Self-pay | Admitting: Oncology

## 2022-09-01 ENCOUNTER — Inpatient Hospital Stay: Payer: Self-pay | Attending: Oncology

## 2022-09-01 DIAGNOSIS — K219 Gastro-esophageal reflux disease without esophagitis: Secondary | ICD-10-CM

## 2022-09-01 DIAGNOSIS — G9341 Metabolic encephalopathy: Secondary | ICD-10-CM

## 2022-09-01 LAB — GLUCOSE, CAPILLARY: Glucose-Capillary: 131 mg/dL — ABNORMAL HIGH (ref 70–99)

## 2022-09-01 NOTE — Assessment & Plan Note (Signed)
Continue Protonix °

## 2022-09-01 NOTE — Assessment & Plan Note (Signed)
-  Continue MS Contin 30 mg twice daily -As needed as needed Dilaudid and Percocet

## 2022-09-01 NOTE — TOC Progression Note (Signed)
Transition of Care [facility]) - Progression Note    Patient Details  Name: Sophia [name] MRN: [id number] Date of Birth: May 10, 1980  Transition of Care [facility]) CM/SW Contact  Allena Katz, LCSW Phone Number: 09/01/2022, 2:41 PM  Clinical Narrative:   Laurette Schimke made referral to [facility] inpatient hospice on behalf of patient. CSW following for care plan updates and needs.          Expected Discharge Plan and Services                                               Social Determinants of Health (SDOH) Interventions SDOH Screenings   Food Insecurity: No Food Insecurity (08/31/2022)  Recent Concern: Food Insecurity - Food Insecurity Present (06/23/2022)  Housing: Low Risk  (08/31/2022)  Recent Concern: Housing - High Risk (06/23/2022)  Transportation Needs: No Transportation Needs (08/31/2022)  Recent Concern: Transportation Needs - Unmet Transportation Needs (07/21/2022)  Utilities: Not At Risk (08/31/2022)  Alcohol Screen: Low Risk  (11/18/2021)  Depression (PHQ2-9): Medium Risk (06/23/2022)  Financial Resource Strain: High Risk (11/18/2021)  Physical Activity: Inactive (11/18/2021)  Social Connections: Socially Isolated (06/23/2022)  Stress: Stress Concern Present (11/18/2021)  Tobacco Use: Low Risk  (08/31/2022)    Readmission Risk Interventions     No data to display

## 2022-09-01 NOTE — Assessment & Plan Note (Signed)
S/p whole brain radiation.  Currently on chemotherapy.  She was due today and dose was canceled. -They will resume chemo if functional status improves, otherwise she will be a candidate for hospice facility

## 2022-09-01 NOTE — Progress Notes (Signed)
Civil engineer, contracting [facility]) Hospital Liaison Note  Received request from Allena Katz, LCSW, Transitions of Care Manager, for hospice services at home after discharge.  Spoke with son, Salli Quarry, to initiate education related to hospice philosophy, services, and team approach to care. Son verbalized understanding of information given.  DME needs discussed.  Patient has the following equipment in the home: WC, Elevated commode seat and a walker. Patient/family requests the following equipment in the home: Hospital bed.    Due to patient's symptom management needs, a bed was offered to the patient at the [facility], but the son and family decided that they wanted patient to return directly home after discharge from the hospital; therefore, they declined the Hospice Home bed.    Please send signed and completed DNR home with the patient/family.  Please provide prescriptions at discharge as needed to ensure ongoing symptom management.    [facility] information and contact numbers given to son. Above information shared with Allena Katz, LCSW, Transitions of Care Manager.     Please call with any Hospice related questions or concerns.  Thank you for the opportunity to participate in this patient's care.  Redge Gainer, [facility] Liaison [phone]

## 2022-09-01 NOTE — TOC Progression Note (Signed)
Transition of Care [facility]) - Progression Note    Patient Details  Name: Sophia [name] MRN: [id number] Date of Birth: 08/20/1980  Transition of Care [facility]) CM/SW Contact  Allena Katz, LCSW Phone Number: 09/01/2022, 4:10 PM  Clinical Narrative:    Per [facility] Social worker  "Hey - I just got off the phone with patient's friend and she told me the plan for the kids. The youngest child is going with his father who is involved in his life. Patient's mother is flying in from [place] tomorrow and will take care of the 44 y/o. The 44 year old is going with the father"   "The 86 y/o son is named Clint Lipps and his cell number is [phone] if you need it."    Messaged relayed to MD and team. TOC following.     Expected Discharge Plan and Services                                               Social Determinants of Health (SDOH) Interventions SDOH Screenings   Food Insecurity: No Food Insecurity (08/31/2022)  Recent Concern: Food Insecurity - Food Insecurity Present (06/23/2022)  Housing: Low Risk  (08/31/2022)  Recent Concern: Housing - High Risk (06/23/2022)  Transportation Needs: No Transportation Needs (08/31/2022)  Recent Concern: Transportation Needs - Unmet Transportation Needs (07/21/2022)  Utilities: Not At Risk (08/31/2022)  Alcohol Screen: Low Risk  (11/18/2021)  Depression (PHQ2-9): Medium Risk (06/23/2022)  Financial Resource Strain: High Risk (11/18/2021)  Physical Activity: Inactive (11/18/2021)  Social Connections: Socially Isolated (06/23/2022)  Stress: Stress Concern Present (11/18/2021)  Tobacco Use: Low Risk  (08/31/2022)    Readmission Risk Interventions     No data to display

## 2022-09-01 NOTE — Assessment & Plan Note (Signed)
Acute metabolic encephalopathy.  Likely secondary to metastatic disease to brain.  Patient had a small midline shift and concern of some vasogenic edema. Her oncologist was consulted and according to  [name], overall prognosis is poor.  There is no role for surgery or radiation.  Patient was started on IV Decadron and Keppra. Seems to have some improvement and currently stable. Palliative care from cancer center is on board. Patient has 3 young kids with no family or community support locally. -Continue with IV Decadron and Keppra

## 2022-09-01 NOTE — Progress Notes (Signed)
Progress Note   Patient: Sophia [name] KGM:[id number] DOB: 1980-07-28 DOA: 08/30/2022     1 DOS: the patient was seen and examined on 09/01/2022   Brief hospital course: Taken from H&P.   Sophia [name] is a 42 y.o. 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    Patient is from [place] and lives here with her 3 kids ages between 60-11.  No family support.  Data reviewed independently and ED Course: pt was found to have WBC 2.9, GFR> 60, temperature normal, blood pressure 112/73, heart rate of 83, RR 18, oxygen saturation 100% on room air. CTA of head and neck negative for LVO.   MRI-brain w/ and w/o contrast: 1. Mixed response to therapy since April: -11 enhancing metastases in both cerebral and cerebellar hemispheres are all slightly smaller. And cerebellar edema has resolved. 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 - And there are one, possibly two subcentimeter NEW left hemisphere white matter metastases (series 18, images 79 and 94).   2. Ongoing extensive T2 and FLAIR hyperintensity most resembling tumoral edema in the left hemisphere, progressed in the superior left frontal and parietal lobes, and now associated with 3 mm of rightward midline shift.   3. Negative for acute infarct.   CT-head: Extensive vasogenic edema as seen on prior study and compatible with the known metastatic disease present on prior imaging. This appears similar to prior study.  EKG: Sinus rhythm, QTc 500, QT wave in lead III, low voltage, early R wave progression.  Oncology and palliative care from cancer center is on board.  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 If does not show good response in the upcoming days then she will need inpatient hospice due to lack of supportive care.  6/14: Hemodynamically stable, borderline sinus bradycardia.  Aphasia with some improvement, still having some word finding difficulty.  No nausea or vomiting.  Patient has a mother and sister in [place] to whom she can rely to take care of her kids.     Assessment and Plan: * Aphasia Acute metabolic encephalopathy.  Likely secondary to metastatic disease to brain.  Patient had a small midline shift and concern of some vasogenic edema. Her oncologist was consulted and according to  [name], overall prognosis is poor.  There is no role for surgery or radiation.  Patient was started on IV Decadron and Keppra. Seems to have some improvement and currently stable. Palliative care from cancer center is on board. Patient has 3 young kids with no family or community support locally. -Continue with IV Decadron and Keppra  Breast cancer metastasized to brain [facility]) S/p whole brain radiation.  Currently on chemotherapy.  She was due today and dose was canceled. -They will resume chemo if functional status improves, otherwise she will be a candidate for hospice facility  Neoplasm related pain -Continue MS Contin 30 mg twice daily -As needed as needed Dilaudid and Percocet  GERD (gastroesophageal reflux disease) -Continue Protonix  Depression with anxiety -Continue home meds      Subjective: Patient was seen and examined today.  Able to communicate somewhat but looks like having some word finding difficulty.  Spanish interpreter was used for  communication.   Physical Exam: Vitals:   08/31/22 2017 09/01/22 0056 09/01/22 0447 09/01/22 0820  BP: (!) 116/99 127/70 (!) 111/54 (!) 123/54  Pulse: 61 (!) 47 (!) 49 (!) 45  Resp: 18 16 20 16   Temp: 97.7 F (36.5 C) 97.7 F (36.5 C) 97.7 F (36.5 C) (!) 97.5 F (36.4 C)  TempSrc: Oral Oral Oral Oral   SpO2: 100% 100% 100% 100%   General.  Malnourished lady, in no acute distress. Pulmonary.  Lungs clear bilaterally, normal respiratory effort. CV.  Regular rate and rhythm, no JVD, rub or murmur. Abdomen.  Soft, nontender, nondistended, BS positive. CNS.  Alert and oriented .  No focal neurologic deficit. Extremities.  No edema, no cyanosis, pulses intact and symmetrical. Psychiatry.  Judgment and insight appears normal.   Data Reviewed: Prior data reviewed  Family Communication: Discussed with patient.  No family at bedside.  Patient has 3 younger kids.  Called her friend who is listed as contact with no response.  Disposition: Status is: Inpatient Remains inpatient appropriate because: Severity of illness  Planned Discharge Destination:  To be determined  DVT prophylaxis.  Subcu heparin Time spent: 50 minutes  This record has been created using Conservation officer, historic buildings. Errors have been sought and corrected,but may not always be located. Such creation errors do not reflect on the standard of care.   Author: Arnetha Courser, MD 09/01/2022 1:46 PM  For on call review [email or web address].

## 2022-09-01 NOTE — Progress Notes (Signed)
SLP Cancellation Note  Patient Details Name: Sophia [name] MRN: [id number] DOB: 07-Feb-1981   Cancelled treatment:       Reason Eval/Treat Not Completed: Medical issues which prohibited therapy;Patient not medically ready (chart reviewed; NSG consulted)  Per NSG report this morning, MD has initiated dexamethasone 4 mg IV every 8 hours(steroid) in hopes to address the edema and improve neurologic functioning.   Reviewed chart notes; no new notes since yesterday. ST services will monitor pt's status next 1-2 days and be available when pt is appropriate for assessment Discussed w/ NSG to utilize [employer] and offer only F:1-2 choices, repeat to clarify information, and reduce distractions during conversation. Recommend aspiration precautions. NSG agreed.     Jerilynn Som, MS, CCC-SLP Speech Language Pathologist Rehab Services; [facility] [phone] (ascom) Shamera Yarberry 09/01/2022, 11:54 AM

## 2022-09-01 NOTE — Assessment & Plan Note (Signed)
-   Continue home meds °

## 2022-09-01 NOTE — Hospital Course (Addendum)
Taken from H&P.   Sophia [name] is a 42 y.o. 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    Patient is from [place] and lives here with her 3 kids ages between 54-11.  No family support.  Data reviewed independently and ED Course: pt was found to have WBC 2.9, GFR> 60, temperature normal, blood pressure 112/73, heart rate of 83, RR 18, oxygen saturation 100% on room air. CTA of head and neck negative for LVO.   MRI-brain w/ and w/o contrast: 1. Mixed response to therapy since April: -11 enhancing metastases in both cerebral and cerebellar hemispheres are all slightly smaller. And cerebellar edema has resolved. 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 - And there are one, possibly two subcentimeter NEW left hemisphere white matter metastases (series 18, images 79 and 94).   2. Ongoing extensive T2 and FLAIR hyperintensity most resembling tumoral edema in the left hemisphere, progressed in the superior left frontal and parietal lobes, and now associated with 3 mm of rightward midline shift.   3. Negative for acute infarct.   CT-head: Extensive vasogenic edema as seen on prior study and compatible with the known metastatic disease present on prior imaging. This appears similar to prior study.  EKG: Sinus rhythm, QTc 500, QT wave in lead III, low voltage, early R wave progression.  Oncology and palliative care from cancer center is on board.  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 If does not show good response in the upcoming days then she will need inpatient hospice due to lack of supportive care.  6/14: Hemodynamically stable,  borderline sinus bradycardia.  Aphasia with some improvement, still having some word finding difficulty.  No nausea or vomiting.  Patient has a mother and sister in [place] to whom she can rely to take care of her kids.  6/15: Remains stable with improving mentation.  Mother flying in likely today.  Likely be discharged tomorrow once home hospice has been set up and mother is at home to take care of her and her kids.  6/16: Improved mental status.  Switching IV Decadron with p.o.  Awaiting for mother so hospice can set up equipment at home.  6/17: Remained stable.  Awaiting mother to arrive before discharge home with hospice. Very poor prognosis.  6/18: Remained stable.  Still no definitive news about mother's travel plans.  Another message sent to hospice and palliative care from cancer center.  Awaiting discharge so she can be with her and kids at home.

## 2022-09-01 NOTE — Progress Notes (Signed)
Palliative Medicine [facility] at [facility] [phone] [phone]   Name: Sophia [name] Date: 09/01/2022 MRN: [id number]  DOB: 1980/10/24  Patient Care Team: Creig Hines, MD as PCP - General (Oncology) Jim Like, RN as Registered Nurse Scarlett Presto, RN (Inactive) as Registered Nurse Creig Hines, MD as Consulting Physician (Hematology and Oncology)    REASON FOR CONSULTATION: Sophia [name] is a 42 y.o. 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 MRI of the brain on 02/08/2021 revealed widespread metastatic disease to the brain with mild associated edema and small area of hemorrhage in the right frontal.  Patient is status post whole brain radiation.  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  She was referred to palliative care to help address goals and manage ongoing symptoms .   CODE STATUS: DNR  PAST MEDICAL HISTORY: Past Medical History:  Diagnosis Date   Anxiety    Breast cancer (HCC)    with mets   Cancer (HCC)    Colitis    COVID-19 in immunocompromised patient (HCC)    Family history of colon cancer    Vertigo     PAST SURGICAL HISTORY:  Past Surgical History:  Procedure Laterality Date   BREAST BIOPSY Left 08/14/2019   [place] bx of mass, coil marker, path pending   BREAST BIOPSY Left 08/14/2019   [place] bx of LN, hydromarker, path pending   BREAST BIOPSY Left 08/14/2019   affirm bx of calcs, x marker, path pending   ESOPHAGOGASTRODUODENOSCOPY (EGD) WITH PROPOFOL N/A 10/05/2019   Procedure: ESOPHAGOGASTRODUODENOSCOPY (EGD) WITH PROPOFOL;  Surgeon: Toney Reil, MD;  Location: [facility] ENDOSCOPY;  Service: Gastroenterology;  Laterality: N/A;   FLEXIBLE SIGMOIDOSCOPY N/A 10/05/2019   Procedure: FLEXIBLE SIGMOIDOSCOPY;  Surgeon: Toney Reil, MD;  Location: [facility] ENDOSCOPY;  Service:  Gastroenterology;  Laterality: N/A;   INTRAMEDULLARY (IM) NAIL INTERTROCHANTERIC Right 09/01/2019   Procedure: INTRAMEDULLARY (IM) NAIL INTERTROCHANTRIC AND RADIOFREQUENCY ABLATION;  Surgeon: Kennedy Bucker, MD;  Location: [facility] ORS;  Service: Orthopedics;  Laterality: Right;   KYPHOPLASTY N/A 08/29/2019   Procedure: KYPHOPLASTY T6, L1,L4 ,  RADIOFREQUENCY ABLATION;  Surgeon: Kennedy Bucker, MD;  Location: [facility] ORS;  Service: Orthopedics;  Laterality: N/A;   KYPHOPLASTY Right 09/01/2019   Procedure: Right Sacral Radiofrequency Ablation and Cement Augmentation, Right sacrum and iliac crest;  Surgeon: Kennedy Bucker, MD;  Location: [facility] ORS;  Service: Orthopedics;  Laterality: Right;   PORTA CATH INSERTION N/A 08/28/2019   Procedure: PORTA CATH INSERTION;  Surgeon: Annice Needy, MD;  Location: [facility] INVASIVE CV LAB;  Service: Cardiovascular;  Laterality: N/A;    HEMATOLOGY/ONCOLOGY HISTORY:  Oncology History  Primary cancer of left breast with metastasis to other site [facility])  08/28/2019 Initial Diagnosis   Primary cancer of left breast with metastasis to other site [facility])   01/21/2021 Cancer Staging   Staging form: Breast, AJCC 8th Edition - Clinical stage from 01/21/2021: Stage IV (cT2, cN1, cM1, G2, ER-, PR-, HER2+) - Signed by Creig Hines, MD on 01/21/2021 Histologic grading system: 3 grade system   Primary malignant neoplasm of breast with metastasis (HCC)  08/29/2019 Initial Diagnosis   Metastatic breast cancer (HCC)   09/04/2019 - 10/31/2021 Chemotherapy   Patient is on Treatment Plan : BREAST Weekly Paclitaxel + Trastuzumab + Pertuzumab q21d      Genetic Testing   Negative genetic testing. No pathogenic variants identified  on the [facility] CancerNext-Expanded+RNA panel. The report date is 04/13/2020.   The CancerNext-Expanded + RNAinsight gene panel offered by [employer] and includes sequencing and rearrangement analysis for the following 77 genes: IP, ALK, APC*, ATM*, AXIN2, BAP1, BARD1, BLM,  BMPR1A, BRCA1*, BRCA2*, BRIP1*, CDC73, CDH1*,CDK4, CDKN1B, CDKN2A, CHEK2*, CTNNA1, DICER1, FANCC, FH, FLCN, GALNT12, KIF1B, LZTR1, MAX, MEN1, MET, MLH1*, MSH2*, MSH3, MSH6*, MUTYH*, NBN, NF1*, NF2, NTHL1, PALB2*, PHOX2B, PMS2*, POT1, PRKAR1A, PTCH1, PTEN*, RAD51C*, RAD51D*,RB1, RECQL, RET, SDHA, SDHAF2, SDHB, SDHC, SDHD, SMAD4, SMARCA4, SMARCB1, SMARCE1, STK11, SUFU, TMEM127, TP53*,TSC1, TSC2, VHL and XRCC2 (sequencing and deletion/duplication); EGFR, EGLN1, HOXB13, KIT, MITF, PDGFRA, POLD1 and POLE (sequencing only); EPCAM and GREM1 (deletion/duplication only). DNA and RNA analyses performed for * genes.   11/18/2021 - 11/18/2021 Chemotherapy   Patient is on Treatment Plan : BREAST Trastuzumab (8/6) IV D1 + Capecitabine +  Tucatinib q21d     11/18/2021 - 12/09/2021 Chemotherapy   Patient is on Treatment Plan : BREAST Trastuzumab (8/6) IV D1 + Capecitabine +  Tucatinib q21d     12/26/2021 - 06/23/2022 Chemotherapy   Patient is on Treatment Plan : BREAST Trastuzumab (8/6) IV D1 + Capecitabine +  Tucatinib q21d     07/21/2022 -  Chemotherapy   Patient is on Treatment Plan : BREAST METASTATIC Fam-Trastuzumab Deruxtecan-nxki (Enhertu) (5.4) q21d       ALLERGIES:  has No Known Allergies.  MEDICATIONS:  Current Facility-Administered Medications  Medication Dose Route Frequency Provider Last Rate Last Admin   0.9 %  sodium chloride infusion   Intravenous Continuous Mansy, Jan A, MD       acetaminophen (TYLENOL) tablet 650 mg  650 mg Oral Q6H PRN Mansy, Jan A, MD       Or   acetaminophen (TYLENOL) suppository 650 mg  650 mg Rectal Q6H PRN Mansy, Vernetta Honey, MD       calcium-vitamin D (OSCAL WITH D) 500-5 MG-MCG per tablet 1 tablet  1 tablet Oral BID Mansy, Jan A, MD   1 tablet at 08/31/22 0454   Chlorhexidine Gluconate Cloth 2 % PADS 6 each  6 each Topical Daily Lorretta Harp, MD   6 each at 09/01/22 1245   dexamethasone (DECADRON) injection 4 mg  4 mg Intravenous Q8H Lorretta Harp, MD   4 mg at 09/01/22 0500    DULoxetine (CYMBALTA) DR capsule 60 mg  60 mg Oral Daily Mansy, Jan A, MD   60 mg at 09/01/22 0913   feeding supplement (ENSURE ENLIVE / ENSURE PLUS) liquid 237 mL  237 mL Oral BID BM Lorretta Harp, MD   237 mL at 09/01/22 0913   gabapentin (NEURONTIN) capsule 600 mg  600 mg Oral TID Mansy, Jan A, MD   600 mg at 09/01/22 0908   gadobutrol (GADAVIST) 1 MMOL/ML injection 5 mL  5 mL Intravenous Once PRN Georga Hacking, MD       heparin injection 5,000 Units  5,000 Units Subcutaneous Q8H Lorretta Harp, MD   5,000 Units at 09/01/22 0500   HYDROmorphone (DILAUDID) injection 1 mg  1 mg Intravenous Q4H PRN Lorretta Harp, MD   1 mg at 09/01/22 0911   levETIRAcetam (KEPPRA) IVPB 500 mg/100 mL premix  500 mg Intravenous Q12H Lorretta Harp, MD 400 mL/hr at 09/01/22 0914 500 mg at 09/01/22 0914   loratadine (CLARITIN) tablet 10 mg  10 mg Oral Daily Mansy, Jan A, MD   10 mg at 09/01/22 0908   LORazepam (ATIVAN) injection 2 mg  2 mg  Intravenous Q2H PRN Lorretta Harp, MD       LORazepam (ATIVAN) tablet 0.5 mg  0.5 mg Oral Q6H PRN Mansy, Jan A, MD       magnesium hydroxide (MILK OF MAGNESIA) suspension 30 mL  30 mL Oral Daily PRN Mansy, Jan A, MD       morphine (MS CONTIN) 12 hr tablet 30 mg  30 mg Oral Q12H Mansy, Jan A, MD   30 mg at 09/01/22 0908   multivitamin with minerals tablet 1 tablet  1 tablet Oral Daily Lorretta Harp, MD       OLANZapine (ZYPREXA) tablet 10 mg  10 mg Oral QHS PRN Mansy, Jan A, MD       ondansetron [facility]) tablet 4 mg  4 mg Oral Q6H PRN Mansy, Jan A, MD       Or   ondansetron [facility]) injection 4 mg  4 mg Intravenous Q6H PRN Mansy, Vernetta Honey, MD       oxyCODONE-acetaminophen (PERCOCET/ROXICET) 5-325 MG per tablet 1 tablet  1 tablet Oral Q4H PRN Lorretta Harp, MD   1 tablet at 08/31/22 0933   pantoprazole (PROTONIX) EC tablet 40 mg  40 mg Oral Daily Mansy, Jan A, MD   40 mg at 09/01/22 1610   sodium chloride flush (NS) 0.9 % injection 10-40 mL  10-40 mL Intracatheter Q12H Lorretta Harp, MD   10 mL at 08/31/22  2250   sodium chloride flush (NS) 0.9 % injection 10-40 mL  10-40 mL Intracatheter PRN Lorretta Harp, MD       traZODone (DESYREL) tablet 25 mg  25 mg Oral QHS PRN Mansy, Vernetta Honey, MD       Facility-Administered Medications Ordered in Other Encounters  Medication Dose Route Frequency Provider Last Rate Last Admin   0.9 %  sodium chloride infusion   Intravenous Continuous Mauro Kaufmann, NP   Stopped at 06/02/20 1453   heparin lock flush 100 unit/mL  500 Units Intravenous Once Creig Hines, MD       heparin lock flush 100 unit/mL  500 Units Intravenous Once Creig Hines, MD       sodium chloride flush (NS) 0.9 % injection 10 mL  10 mL Intravenous Once Creig Hines, MD       sodium chloride flush (NS) 0.9 % injection 10 mL  10 mL Intravenous Once Creig Hines, MD        VITAL SIGNS: BP (!) 123/54 (BP Location: Right Arm)   Pulse (!) 45   Temp (!) 97.5 F (36.4 C) (Oral)   Resp 16   SpO2 100%  There were no vitals filed for this visit.  Estimated body mass index is 24.22 kg/m as calculated from the following:   Height as of 08/11/22: 4\' 11"  (1.499 m).   Weight as of 08/11/22: 119 lb 14.4 oz (54.4 kg).  LABS: CBC:    Component Value Date/Time   WBC 2.5 (L) 08/31/2022 0512   HGB 11.3 (L) 08/31/2022 0512   HGB 12.0 08/11/2022 0927   HCT 34.5 (L) 08/31/2022 0512   PLT 451 (H) 08/31/2022 0512   PLT 578 (H) 08/11/2022 0927   MCV 82.7 08/31/2022 0512   NEUTROABS 1.4 (L) 08/30/2022 2357   LYMPHSABS 1.0 08/30/2022 2357   MONOABS 0.4 08/30/2022 2357   EOSABS 0.1 08/30/2022 2357   BASOSABS 0.0 08/30/2022 2357   Comprehensive Metabolic Panel:    Component Value Date/Time   NA 139 08/31/2022 0512  K 3.8 08/31/2022 0512   CL 108 08/31/2022 0512   CO2 23 08/31/2022 0512   BUN 7 08/31/2022 0512   CREATININE 0.45 08/31/2022 0512   CREATININE 0.42 (L) 08/11/2022 0927   GLUCOSE 96 08/31/2022 0512   CALCIUM 8.8 (L) 08/31/2022 0512   AST 24 08/30/2022 2357   AST 24 08/11/2022 0927    ALT 14 08/30/2022 2357   ALT 15 08/11/2022 0927   ALKPHOS 73 08/30/2022 2357   BILITOT 0.6 08/30/2022 2357   BILITOT 0.3 08/11/2022 0927   PROT 7.3 08/30/2022 2357   ALBUMIN 3.8 08/30/2022 2357    RADIOGRAPHIC STUDIES: MR Brain W and Wo Contrast  Result Date: 08/31/2022 CLINICAL DATA:  42 year old female with metastatic breast cancer. Treated initially with whole brain radiation in late 2022, and repeat salvage whole brain XRT in October 2023. Vasogenic edema. Neurologic deficit. EXAM: MRI HEAD WITHOUT AND WITH CONTRAST TECHNIQUE: Multiplanar, multiecho pulse sequences of the brain and surrounding structures were obtained without and with intravenous contrast. CONTRAST:  5mL GADAVIST GADOBUTROL 1 MMOL/ML IV SOLN COMPARISON:  CTA head and neck 0041 hours today. Most recent restaging brain MRI 07/04/2022. FINDINGS: Brain: No restricted diffusion or evidence of acute infarction. Four cerebellar enhancing brain metastases are all slightly smaller since 07/04/2022, ranging from punctate (right cerebellum series 18, image 21) up to 13 mm (left posterior cerebellum series 18, image 18). All lesions annotated on series 18. And cerebellar edema has resolved since 07/04/2022. Irregular, stellate and rim enhancing lesion at the left frontal lobe anterior operculum has enlarged since April, now 25 by 24 mm (AP by transverse compare series 18, image 67 to previous series 18, image 79, image 77 now to previous image 85). And again the possibility of leptomeningeal disease along the margins of this lesion is raised (series 19, image 18). Ongoing confluent T2/FLAIR hyperintense edema surrounding this area, and throughout the left hemisphere - see additional details below. Seven additional chronic enhancing metastases scattered in both cerebral hemispheres are all slightly smaller since 07/04/2022, annotated on series 18. However, there is a new 3-4 mm round focus of enhancement in the left centrum semiovale series  18, image 94. And there is also a faint new curvilinear area of enhancement in the left parietal lobe near the periatrial white matter series 18, image 79. No other pathologic dural thickening. Confluent T2 and FLAIR hyperintensity in the left hemisphere continues and has progressed in the left superior frontal gyrus and superior parietal lobe on series 15, image 41, slightly more confluent elsewhere. There is mild but increased mass effect on the left lateral ventricle, and there is now 3 mm of rightward midline shift at the anterior septum pellucidum (series 15, image 30). No ventriculomegaly. No definite ependymal enhancement. Basilar cisterns remain patent. No extra-axial collection or acute intracranial hemorrhage. Cervicomedullary junction and pituitary are within normal limits. Vascular: Major intracranial vascular flow voids are stable. The major dural venous sinuses are enhancing and appear to be patent. Skull and upper cervical spine: Bone marrow signal remains within normal limits. Negative visible cervical spine and spinal cord. Sinuses/Orbits: Similar rightward gaze deviation. Paranasal sinuses remain clear. Other: Right mastoid effusion has not significantly changed. Negative visible scalp and face. IMPRESSION: 1. Mixed response to therapy since April: -11 enhancing metastases in both cerebral and cerebellar hemispheres are all slightly smaller. And cerebellar edema has resolved. - But the dominant left anterior frontal operculum metastasis with suspicion of associated leptomeningeal disease has increased from 20 mm in April  up to 25 mm now. - And there are one, possibly two subcentimeter NEW left hemisphere white matter metastases (series 18, images 79 and 94). 2. Ongoing extensive T2 and FLAIR hyperintensity most resembling tumoral edema in the left hemisphere, progressed in the superior left frontal and parietal lobes, and now associated with 3 mm of rightward midline shift. 3. Negative for acute  infarct. Electronically Signed   By: Odessa Fleming M.D.   On: 08/31/2022 04:12   DG Abdomen 1 View  Result Date: 08/31/2022 CLINICAL DATA:  Pre MRI clearance EXAM: ABDOMEN - 1 VIEW COMPARISON:  04/24/2022 FINDINGS: Vertebral augmentation is again noted at L4 and L1 as well as within the right sacrum and iliac bone. Bladder is filled with contrast material. No obstructive changes are seen. Prior pinning in the right hip is noted. No radiopaque foreign body is noted. IMPRESSION: No acute abnormality noted.  No findings to preclude MRI scan. Electronically Signed   By: Alcide Clever M.D.   On: 08/31/2022 03:29   DG Chest Portable 1 View  Result Date: 08/31/2022 CLINICAL DATA:  Pre MRI clearance EXAM: PORTABLE CHEST 1 VIEW COMPARISON:  02/05/2021 FINDINGS: Cardiac shadow is stable. Right chest wall port is again seen and stable. The overall inspiratory effort is poor. Changes of prior vertebral augmentation are noted in the midthoracic spine. IMPRESSION: No acute abnormality noted. No retained foreign body to preclude MRI scanning. Electronically Signed   By: Alcide Clever M.D.   On: 08/31/2022 03:28   CT ANGIO HEAD NECK W WO CM  Result Date: 08/31/2022 CLINICAL DATA:  Initial evaluation for neuro deficit, stroke suspected. EXAM: CT ANGIOGRAPHY HEAD AND NECK WITH AND WITHOUT CONTRAST TECHNIQUE: Multidetector CT imaging of the head and neck was performed using the standard protocol during bolus administration of intravenous contrast. Multiplanar CT image reconstructions and MIPs were obtained to evaluate the vascular anatomy. Carotid stenosis measurements (when applicable) are obtained utilizing NASCET criteria, using the distal internal carotid diameter as the denominator. RADIATION DOSE REDUCTION: This exam was performed according to the departmental dose-optimization program which includes automated exposure control, adjustment of the mA and/or kV according to patient size and/or use of iterative reconstruction  technique. CONTRAST:  75mL OMNIPAQUE IOHEXOL 350 MG/ML SOLN COMPARISON:  Comparison made with prior CT from 08/30/2022. FINDINGS: CTA NECK FINDINGS Aortic arch: Visualized aortic arch normal caliber with standard branch pattern. No stenosis about the origin the great vessels. Right carotid system: Right common and internal carotid arteries are patent without stenosis or dissection. Left carotid system: Left common and internal carotid arteries are patent without stenosis or dissection. Vertebral arteries: Both vertebral arteries arise from subclavian arteries. No proximal subclavian artery stenosis. Right vertebral artery slightly dominant. Vertebral arteries patent without stenosis or dissection. Skeleton: Few scattered abnormal sclerotic lesion seen involving the visualized osseous structures, consistent with known osseous metastatic disease. Associated mild pathologic compression deformity at the inferior endplate of C5 without retropulsion. Other neck: No other acute finding. Upper chest: No other acute finding. Review of the MIP images confirms the above findings CTA HEAD FINDINGS Anterior circulation: Both internal carotid arteries are widely patent to the termini without stenosis or other abnormality. Left A1 segment widely patent. Right A1 hypoplastic and/or absent. Normal anterior communicating complex. Anterior cerebral arteries deviated to the right but are widely patent without stenosis. No M1 stenosis or occlusion. No proximal MCA branch occlusion or high-grade stenosis. Distal MCA branches perfused and symmetric. Posterior circulation: Both V4 segments widely patent without  stenosis. Both PICA patent. Basilar widely patent without stenosis. Superior cerebral arteries patent bilaterally. Left PCA primarily supplied via the basilar. Predominant fetal type origin of the right PCA. Both PCAs patent without stenosis. Venous sinuses: Patent allowing for timing the contrast bolus. Anatomic variants:  Hypoplastic/absent right A1. Mildly dominant right vertebral artery. No intracranial aneurysm. Vasogenic edema involving the left greater than right cerebral hemispheres related to known metastatic disease, better characterized on prior CT. Review of the MIP images confirms the above findings IMPRESSION: 1. Negative CTA of the head and neck. No large vessel occlusion or other emergent finding. No hemodynamically significant or correctable stenosis. 2. Intracranial metastatic disease with associated vasogenic edema and mild left-to-right shift. Findings are better characterized on prior exams. 3. Scattered osseous metastatic disease. Associated mild pathologic compression deformity at the inferior endplate of C5 without retropulsion. Electronically Signed   By: Rise Mu M.D.   On: 08/31/2022 02:21   CT HEAD WO CONTRAST ( )  Result Date: 08/30/2022 CLINICAL DATA:  Neuro deficit, acute, stroke suspected new aphasia, pt with known brain mets EXAM: CT HEAD WITHOUT CONTRAST TECHNIQUE: Contiguous axial images were obtained from the base of the skull through the vertex without intravenous contrast. RADIATION DOSE REDUCTION: This exam was performed according to the departmental dose-optimization program which includes automated exposure control, adjustment of the mA and/or kV according to patient size and/or use of iterative reconstruction technique. COMPARISON:  06/12/2018, MRI 07/04/2022 FINDINGS: Brain: Extensive vasogenic edema throughout the left frontal lobe, extensive vasogenic edema throughout the cerebral hemispheres bilaterally, left greater than right, similar to prior study. The previously seen brain lesions by MRI not appreciated by noncontrast CT. No midline shift or hydrocephalus. No hemorrhage. Vascular: No hyperdense vessel or unexpected calcification. Skull: No acute calvarial abnormality. Sinuses/Orbits: No acute findings Other: None IMPRESSION: Extensive vasogenic edema as seen on prior  study and compatible with the known metastatic disease present on prior imaging. This appears similar to prior study. Previously seen individual metastatic lesions by prior MRI not well visualized on this noncontrast CT. No hemorrhage, hydrocephalus or midline shift. Electronically Signed   By: Charlett Nose M.D.   On: 08/30/2022 23:46    PERFORMANCE STATUS (ECOG) : 2 - Symptomatic, <50% confined to bed  Review of Systems Unless otherwise noted, a complete review of systems is negative.  Physical Exam General: NAD Pulmonary: unlabored Extremities: no edema, no joint deformities Skin: no rashes Neurological: Weakness, alert, some confusion  IMPRESSION: Mental status is improved today.  Patient is alert and more conversive.  However, she remains confused with some word searching.  I met with patient's son Clint Lipps and friend Addison Naegeli.  We again discussed recommendation for hospice care in the setting of progression of leptomeningeal mass.  Patient has improved some on steroids but that will only provide a temporizing effect with expected future decline.  She is not felt to have any viable options left for treatment.  Son verbalized agreement with hospice care.  Patient will likely have to initiate hospice care at home but would recommend future transition to an IPU if needed given limited social support.  Reportedly, patient's mother was able to obtain a visa with plans to travel here in the near future.  I also completed a letter requesting expedited visa for patient's sister.  PLAN: -Referral to hospice   Time Total: 45 minutes  Visit consisted of counseling and education dealing with the complex and emotionally intense issues of symptom management and palliative care in the  setting of serious and potentially life-threatening illness.Greater than 50%  of this time was spent counseling and coordinating care related to the above assessment and plan.  Signed by: Altha Harm, PhD, NP-C

## 2022-09-01 NOTE — Progress Notes (Signed)
[facility] CSW Progress Note  Visual merchandiser spoke with patient's friend, Sophia [name].  She spoke with patient this morning and reported patient was more lucid.  She stated patient's 44 y/o son will go with his biological father who is involved in his life.  Patient's mother is flying in from [place] tomorrow.  Patient has asked that her mother care for the 13 y/o.  Spoke with patient's 52 y/o son, Sophia [name] [phone]).  He said he is trying to be strong, but that it is difficult.  CSW offered supportive counseling.  He understands Hospice will most likely become involved.    Dorothey Baseman, LCSW Clinical Social Worker [facility]

## 2022-09-02 LAB — GLUCOSE, CAPILLARY: Glucose-Capillary: 140 mg/dL — ABNORMAL HIGH (ref 70–99)

## 2022-09-02 NOTE — Progress Notes (Signed)
Civil engineer, contracting Nurse Liaison Note  Hospital Liaison Team (HLT) continues to follow patient through discharge.  I called and spoke with patient's son, Salli Quarry, to arrange DME delivery.  He asked that we not set up DME delivery until after his Grandmother arrives from [place].  Expect her arrival late this evening or tomorrow.  Continued collaboration with patient, family and hospital staff is ongoing through final disposition.  Please do not hesitate to call with any hospice related questions or concerns.  Thank you for allowing participation in this patients care.  Norris Cross, RN Nurse Liaison [phone]

## 2022-09-02 NOTE — Progress Notes (Signed)
Progress Note   Patient: Sophia [name] ZOX:[id number] DOB: [birth year] DOA: 08/30/2022     2 DOS: the patient was seen and examined on 09/02/2022   Brief hospital course: Taken from H&P.   Sophia [name] is a 42 y.o. 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    Patient is from [place] and lives here with her 3 kids ages between 78-11.  No family support.  Data reviewed independently and ED Course: pt was found to have WBC 2.9, GFR> 60, temperature normal, blood pressure 112/73, heart rate of 83, RR 18, oxygen saturation 100% on room air. CTA of head and neck negative for LVO.   MRI-brain w/ and w/o contrast: 1. Mixed response to therapy since April: -11 enhancing metastases in both cerebral and cerebellar hemispheres are all slightly smaller. And cerebellar edema has resolved. 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 - And there are one, possibly two subcentimeter NEW left hemisphere white matter metastases (series 18, images 79 and 94).   2. Ongoing extensive T2 and FLAIR hyperintensity most resembling tumoral edema in the left hemisphere, progressed in the superior left frontal and parietal lobes, and now associated with 3 mm of rightward midline shift.   3. Negative for acute infarct.   CT-head: Extensive vasogenic edema as seen on prior study and compatible with the known metastatic disease present on prior imaging. This appears similar to prior study.  EKG: Sinus rhythm, QTc 500, QT wave in lead III, low voltage, early R wave progression.  Oncology and palliative care from cancer center is on board.  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 If does not show good response in the upcoming days then she will need inpatient hospice due to lack of supportive care.  6/14: Hemodynamically stable, borderline sinus bradycardia.  Aphasia with some improvement, still having some word finding difficulty.  No nausea or vomiting.  Patient has a mother and sister in [place] to whom she can rely to take care of her kids.  6/15: Remains stable with improving mentation.  Mother flying in likely today.  Likely be discharged tomorrow once home hospice has been set up and mother is at home to take care of her and her kids.   Assessment and Plan: * Aphasia Acute metabolic encephalopathy.  Likely secondary to metastatic disease to brain.  Patient had a small midline shift and concern of some vasogenic edema. Her oncologist was consulted and according to  [name], overall prognosis is poor.  There is no role for surgery or radiation.  Patient was started on IV Decadron and Keppra. Seems to have some improvement and currently stable. Palliative care from cancer center is on board. Patient has 3 young kids with no family or community support locally. -Continue with IV Decadron and Keppra  Breast cancer metastasized to brain [facility]) S/p whole brain radiation.  Currently on chemotherapy.  She was due today and dose was canceled. -They will resume chemo if functional status improves, otherwise she will be a candidate for hospice facility  Neoplasm related pain -Continue MS Contin 30 mg twice daily -As needed as needed Dilaudid and Percocet  GERD (gastroesophageal reflux disease) -Continue Protonix  Depression  with anxiety -Continue home meds      Subjective: Patient with improved communication and mentation.  All 3 young kids at bedside  Physical Exam: Vitals:   09/01/22 2328 09/02/22 0809 09/02/22 1206 09/02/22 1623  BP: 124/62 (!) 140/88 129/76 (!) 141/85  Pulse: (!) 50 (!) 58 (!) 45 (!) 59  Resp: 18 16 16 16   Temp:  98 F (36.7 C) 98.2 F (36.8 C)    TempSrc:  Oral    SpO2: 100% 98% 98% 99%   General.  Frail lady, in no acute distress. Pulmonary.  Lungs clear bilaterally, normal respiratory effort. CV.  Regular rate and rhythm, no JVD, rub or murmur. Abdomen.  Soft, nontender, nondistended, BS positive. CNS.  Alert and oriented .  No focal neurologic deficit. Extremities.  No edema, no cyanosis, pulses intact and symmetrical. Psychiatry.  Judgment and insight appears normal.   Data Reviewed: Prior data reviewed  Family Communication: Discussed with patient.  Kids were at bedside  Disposition: Status is: Inpatient Remains inpatient appropriate because: Severity of illness  Planned Discharge Destination:  To be determined  DVT prophylaxis.  Subcu heparin Time spent: 45 minutes  This record has been created using Conservation officer, historic buildings. Errors have been sought and corrected,but may not always be located. Such creation errors do not reflect on the standard of care.   Author: Arnetha Courser, MD 09/02/2022 4:32 PM  For on call review [email or web address].

## 2022-09-02 NOTE — Consult Note (Addendum)
Hematology/Oncology Consult note [facility] Telephone:([phone] [phone]  Patient Care Team: Creig Hines, MD as PCP - General (Oncology) Jim Like, RN as Registered Nurse Scarlett Presto, RN (Inactive) as Registered Nurse Creig Hines, MD as Consulting Physician (Hematology and Oncology)   Name of the patient: Sophia [name]  [id number]  Jan 16, 1981    Reason for consult: Metastatic HER2 positive breast cancer   Requesting physician: Dr. 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  She has known bone metastases and brain metastases.  Although her systemic disease has been under good control I unfortunately patient has had progressive disease in her brain.  She has gone through Taxol Herceptin and Perjeta followed by Tucatinib Xeloda Herceptin and most recently on third line Enhertu.  She was also referred to [facility] for and she was not deemed to be a candidate for any clinical trials.  She has undergone 2 rounds of whole brain radiation as well.  Patient last received Enhertu on 08/11/2022.  Patient presented to the ER with symptoms of aphasia and underwent multiple imaging studies including CT head without contrast CT angio neck and MRI brain with and without contrast.  MRI brain shows mixed response to treatment since April.  11 mm enhancing metastases in cerebral and cerebellar hemispheres are slightly smaller.  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  Patient was given IV Decadron in the last 24 hours and her aphasia has improved to some extent.  She is able to comprehend what I am saying to some extent and answering yes or no and using limited vocabulary but is  unable to understand the full extent of her present condition.  In the past we have had multiple goals of care conversations with her and she has chosen to remain a full code.  She lives with her 3 sons the oldest of who is 65 years of age.  She did not have a concrete plan for her children after her passing.  She has been followed up with social work in the cancer center as well.  ECOG PS- 2  Pain scale- 0   Review of systems- Review of Systems  Unable to perform ROS: Mental acuity    No Known Allergies  Patient Active Problem List   Diagnosis Date Noted   Aphasia 08/31/2022   Breast cancer metastasized to brain (HCC) 08/31/2022   GERD (gastroesophageal reflux disease) 08/31/2022   Depression with anxiety 08/31/2022   Acute metabolic encephalopathy 08/31/2022   Palliative care encounter 08/31/2022   AMS (altered mental status) 03/15/2021   Femur fracture, right (HCC) 03/14/2021   Fall at home, initial encounter 03/14/2021   C. difficile diarrhea 06/07/2020   E coli infection 06/07/2020   Genetic testing 04/16/2020   Family history of colon cancer    Acute colitis 10/01/2019   Abnormal LFTs 10/01/2019   Malignant neoplasm metastatic to bone (HCC) 09/26/2019   Encounter for monoclonal antibody treatment for malignancy    Goals of care, counseling/discussion    Neoplasm related pain    Primary malignant neoplasm of breast with metastasis (HCC) 08/29/2019   Primary cancer of left breast with metastasis to other site [facility]) 08/28/2019   Multiple pathological fractures 08/28/2019  Anxiety 08/28/2019   Preoperative clearance 08/28/2019   Leukocytosis 08/28/2019     Past Medical History:  Diagnosis Date   Anxiety    Breast cancer (HCC)    with mets   Cancer (HCC)    Colitis    COVID-19 in immunocompromised patient [facility])    Family history of colon cancer    Vertigo      Past Surgical History:  Procedure Laterality Date   BREAST BIOPSY Left 08/14/2019   [place] bx of  mass, coil marker, path pending   BREAST BIOPSY Left 08/14/2019   [place] bx of LN, hydromarker, path pending   BREAST BIOPSY Left 08/14/2019   affirm bx of calcs, x marker, path pending   ESOPHAGOGASTRODUODENOSCOPY (EGD) WITH PROPOFOL N/A 10/05/2019   Procedure: ESOPHAGOGASTRODUODENOSCOPY (EGD) WITH PROPOFOL;  Surgeon: Toney Reil, MD;  Location: [facility] ENDOSCOPY;  Service: Gastroenterology;  Laterality: N/A;   FLEXIBLE SIGMOIDOSCOPY N/A 10/05/2019   Procedure: FLEXIBLE SIGMOIDOSCOPY;  Surgeon: Toney Reil, MD;  Location: [facility] ENDOSCOPY;  Service: Gastroenterology;  Laterality: N/A;   INTRAMEDULLARY (IM) NAIL INTERTROCHANTERIC Right 09/01/2019   Procedure: INTRAMEDULLARY (IM) NAIL INTERTROCHANTRIC AND RADIOFREQUENCY ABLATION;  Surgeon: Kennedy Bucker, MD;  Location: [facility] ORS;  Service: Orthopedics;  Laterality: Right;   KYPHOPLASTY N/A 08/29/2019   Procedure: KYPHOPLASTY T6, L1,L4 ,  RADIOFREQUENCY ABLATION;  Surgeon: Kennedy Bucker, MD;  Location: [facility] ORS;  Service: Orthopedics;  Laterality: N/A;   KYPHOPLASTY Right 09/01/2019   Procedure: Right Sacral Radiofrequency Ablation and Cement Augmentation, Right sacrum and iliac crest;  Surgeon: Kennedy Bucker, MD;  Location: [facility] ORS;  Service: Orthopedics;  Laterality: Right;   PORTA CATH INSERTION N/A 08/28/2019   Procedure: PORTA CATH INSERTION;  Surgeon: Annice Needy, MD;  Location: [facility] INVASIVE CV LAB;  Service: Cardiovascular;  Laterality: N/A;    Social History   Socioeconomic History   Marital status: Single    Spouse name: Not on file   Number of children: Not on file   Years of education: Not on file   Highest education level: Not on file  Occupational History   Not on file  Tobacco Use   Smoking status: Never   Smokeless tobacco: Never  Vaping Use   Vaping Use: Never used  Substance and Sexual Activity   Alcohol use: Not Currently   Drug use: Not Currently   Sexual activity: Not Currently    Birth control/protection:  None  Other Topics Concern   Not on file  Social History Narrative   Lives at home with children   Social Determinants of Health   Financial Resource Strain: High Risk (11/18/2021)   Overall Financial Resource Strain (CARDIA)    Difficulty of Paying Living Expenses: Very hard  Food Insecurity: No Food Insecurity (08/31/2022)   Hunger Vital Sign    Worried About Running Out of Food in the Last Year: Never true    Ran Out of Food in the Last Year: Never true  Recent Concern: Food Insecurity - Food Insecurity Present (06/23/2022)   Hunger Vital Sign    Worried About Running Out of Food in the Last Year: Often true    Ran Out of Food in the Last Year: Often true  Transportation Needs: No Transportation Needs (08/31/2022)   PRAPARE - Administrator, Civil Service (Medical): No    Lack of Transportation (Non-Medical): No  Recent Concern: Transportation Needs - Unmet Transportation Needs (07/21/2022)   PRAPARE - Administrator, Civil Service (Medical):  Yes    Lack of Transportation (Non-Medical): Yes  Physical Activity: Inactive (11/18/2021)   Exercise Vital Sign    Days of Exercise per Week: 0 days    Minutes of Exercise per Session: 0 min  Stress: Stress Concern Present (11/18/2021)   [employer] of Occupational Health - Occupational Stress Questionnaire    Feeling of Stress : Very much  Social Connections: Socially Isolated (06/23/2022)   Social Connection and Isolation Panel [NHANES]    Frequency of Communication with Friends and Family: Three times a week    Frequency of Social Gatherings with Friends and Family: Three times a week    Attends Religious Services: Never    Active Member of Clubs or Organizations: No    Attends Banker Meetings: Never    Marital Status: Never married  Intimate Partner Violence: Not At Risk (08/31/2022)   Humiliation, Afraid, Rape, and Kick questionnaire    Fear of Current or Ex-Partner: No    Emotionally Abused: No     Physically Abused: No    Sexually Abused: No     Family History  Problem Relation Age of Onset   Colon cancer Maternal Uncle      Current Facility-Administered Medications:    0.9 %  sodium chloride infusion, , Intravenous, Continuous, Mansy, Jan A, MD, Last Rate: 100 mL/hr at 09/02/22 Paulo Fruit, New Bag at 09/02/22 1838   acetaminophen (TYLENOL) tablet 650 mg, 650 mg, Oral, Q6H PRN **OR** acetaminophen (TYLENOL) suppository 650 mg, 650 mg, Rectal, Q6H PRN, Mansy, Jan A, MD   calcium-vitamin D (OSCAL WITH D) 500-5 MG-MCG per tablet 1 tablet, 1 tablet, Oral, BID, Mansy, Jan A, MD, 1 tablet at 09/02/22 1014   Chlorhexidine Gluconate Cloth 2 % PADS 6 each, 6 each, Topical, Daily, Lorretta Harp, MD, 6 each at 09/02/22 1014   dexamethasone (DECADRON) injection 4 mg, 4 mg, Intravenous, Q8H, Lorretta Harp, MD, 4 mg at 09/02/22 1506   DULoxetine (CYMBALTA) DR capsule 60 mg, 60 mg, Oral, Daily, Mansy, Jan A, MD, 60 mg at 09/02/22 1014   feeding supplement (ENSURE ENLIVE / ENSURE PLUS) liquid 237 mL, 237 mL, Oral, BID BM, Lorretta Harp, MD, 237 mL at 09/02/22 1507   gabapentin (NEURONTIN) capsule 600 mg, 600 mg, Oral, TID, Mansy, Jan A, MD, 600 mg at 09/02/22 1506   gadobutrol (GADAVIST) 1 MMOL/ML injection 5 mL, 5 mL, Intravenous, Once PRN, Georga Hacking, MD   heparin injection 5,000 Units, 5,000 Units, Subcutaneous, Q8H, Lorretta Harp, MD, 5,000 Units at 09/02/22 1506   HYDROmorphone (DILAUDID) injection 1 mg, 1 mg, Intravenous, Q4H PRN, Lorretta Harp, MD, 1 mg at 09/02/22 1512   levETIRAcetam (KEPPRA) IVPB 500 mg/100 mL premix, 500 mg, Intravenous, Q12H, Lorretta Harp, MD, Stopped at 09/02/22 0830   loratadine (CLARITIN) tablet 10 mg, 10 mg, Oral, Daily, Mansy, Jan A, MD, 10 mg at 09/02/22 1014   LORazepam (ATIVAN) injection 2 mg, 2 mg, Intravenous, Q2H PRN, Lorretta Harp, MD   LORazepam (ATIVAN) tablet 0.5 mg, 0.5 mg, Oral, Q6H PRN, Mansy, Jan A, MD   magnesium hydroxide (MILK OF MAGNESIA) suspension 30 mL, 30 mL,  Oral, Daily PRN, Mansy, Jan A, MD   morphine (MS CONTIN) 12 hr tablet 30 mg, 30 mg, Oral, Q12H, Mansy, Jan A, MD, 30 mg at 09/02/22 1014   multivitamin with minerals tablet 1 tablet, 1 tablet, Oral, Daily, Lorretta Harp, MD, 1 tablet at 09/02/22 1014   OLANZapine (ZYPREXA) tablet 10 mg, 10 mg, Oral, QHS  PRN, Mansy, Jan A, MD   ondansetron [facility]) tablet 4 mg, 4 mg, Oral, Q6H PRN **OR** ondansetron (ZOFRAN) injection 4 mg, 4 mg, Intravenous, Q6H PRN, Mansy, Jan A, MD   oxyCODONE-acetaminophen (PERCOCET/ROXICET) 5-325 MG per tablet 1 tablet, 1 tablet, Oral, Q4H PRN, Lorretta Harp, MD, 1 tablet at 09/02/22 1014   pantoprazole (PROTONIX) EC tablet 40 mg, 40 mg, Oral, Daily, Mansy, Jan A, MD, 40 mg at 09/02/22 1014   sodium chloride flush (NS) 0.9 % injection 10-40 mL, 10-40 mL, Intracatheter, Q12H, Lorretta Harp, MD, 10 mL at 09/02/22 1015   sodium chloride flush (NS) 0.9 % injection 10-40 mL, 10-40 mL, Intracatheter, PRN, Lorretta Harp, MD   traZODone (DESYREL) tablet 25 mg, 25 mg, Oral, QHS PRN, Mansy, Jan A, MD, 25 mg at 09/01/22 2259  Facility-Administered Medications Ordered in Other Encounters:    0.9 %  sodium chloride infusion, , Intravenous, Continuous, Burns, Renda Rolls, NP, Stopped at 06/02/20 1453   heparin lock flush 100 unit/mL, 500 Units, Intravenous, Once, Creig Hines, MD   heparin lock flush 100 unit/mL, 500 Units, Intravenous, Once, Creig Hines, MD   sodium chloride flush (NS) 0.9 % injection 10 mL, 10 mL, Intravenous, Once, Creig Hines, MD   sodium chloride flush (NS) 0.9 % injection 10 mL, 10 mL, Intravenous, Once, Creig Hines, MD   Physical exam:  Vitals:   09/01/22 2328 09/02/22 0809 09/02/22 1206 09/02/22 1623  BP: 124/62 (!) 140/88 129/76 (!) 141/85  Pulse: (!) 50 (!) 58 (!) 45 (!) 59  Resp: 18 16 16 16   Temp: 98 F (36.7 C) 98.2 F (36.8 C)    TempSrc:  Oral    SpO2: 100% 98% 98% 99%   Physical Exam Constitutional:      General: She is not in acute  distress. Cardiovascular:     Rate and Rhythm: Normal rate and regular rhythm.     Heart sounds: Normal heart sounds.  Pulmonary:     Effort: Pulmonary effort is normal.     Breath sounds: Normal breath sounds.  Abdominal:     General: Bowel sounds are normal.     Palpations: Abdomen is soft.  Skin:    General: Skin is warm and dry.  Neurological:     General: No focal deficit present.     Mental Status: She is alert.     Comments: Oriented to self           Latest Ref Rng & Units 08/31/2022    5:12 AM  CMP  Glucose 70 - 99 mg/dL 96   BUN 6 - 20 mg/dL 7   Creatinine 1.61 - 0.96 mg/dL 0.45   Sodium 409 - 811 mmol/L 139   Potassium 3.5 - 5.1 mmol/L 3.8   Chloride 98 - 111 mmol/L 108   CO2 22 - 32 mmol/L 23   Calcium 8.9 - 10.3 mg/dL 8.8       Latest Ref Rng & Units 08/31/2022    5:12 AM  CBC  WBC 4.0 - 10.5 K/uL 2.5   Hemoglobin 12.0 - 15.0 g/dL 91.4   Hematocrit 78.2 - 46.0 % 34.5   Platelets 150 - 400 K/uL 451     @IMAGES @  MR Brain W and Wo Contrast  Result Date: 08/31/2022 CLINICAL DATA:  42 year old female with metastatic breast cancer. Treated initially with whole brain radiation in late 2022, and repeat salvage whole brain XRT in October 2023. Vasogenic edema. Neurologic deficit. EXAM: MRI  HEAD WITHOUT AND WITH CONTRAST TECHNIQUE: Multiplanar, multiecho pulse sequences of the brain and surrounding structures were obtained without and with intravenous contrast. CONTRAST:  5mL GADAVIST GADOBUTROL 1 MMOL/ML IV SOLN COMPARISON:  CTA head and neck 0041 hours today. Most recent restaging brain MRI 07/04/2022. FINDINGS: Brain: No restricted diffusion or evidence of acute infarction. Four cerebellar enhancing brain metastases are all slightly smaller since 07/04/2022, ranging from punctate (right cerebellum series 18, image 21) up to 13 mm (left posterior cerebellum series 18, image 18). All lesions annotated on series 18. And cerebellar edema has resolved since 07/04/2022.  Irregular, stellate and rim enhancing lesion at the left frontal lobe anterior operculum has enlarged since April, now 25 by 24 mm (AP by transverse compare series 18, image 67 to previous series 18, image 79, image 77 now to previous image 85). And again the possibility of leptomeningeal disease along the margins of this lesion is raised (series 19, image 18). Ongoing confluent T2/FLAIR hyperintense edema surrounding this area, and throughout the left hemisphere - see additional details below. Seven additional chronic enhancing metastases scattered in both cerebral hemispheres are all slightly smaller since 07/04/2022, annotated on series 18. However, there is a new 3-4 mm round focus of enhancement in the left centrum semiovale series 18, image 94. And there is also a faint new curvilinear area of enhancement in the left parietal lobe near the periatrial white matter series 18, image 79. No other pathologic dural thickening. Confluent T2 and FLAIR hyperintensity in the left hemisphere continues and has progressed in the left superior frontal gyrus and superior parietal lobe on series 15, image 41, slightly more confluent elsewhere. There is mild but increased mass effect on the left lateral ventricle, and there is now 3 mm of rightward midline shift at the anterior septum pellucidum (series 15, image 30). No ventriculomegaly. No definite ependymal enhancement. Basilar cisterns remain patent. No extra-axial collection or acute intracranial hemorrhage. Cervicomedullary junction and pituitary are within normal limits. Vascular: Major intracranial vascular flow voids are stable. The major dural venous sinuses are enhancing and appear to be patent. Skull and upper cervical spine: Bone marrow signal remains within normal limits. Negative visible cervical spine and spinal cord. Sinuses/Orbits: Similar rightward gaze deviation. Paranasal sinuses remain clear. Other: Right mastoid effusion has not significantly changed.  Negative visible scalp and face. IMPRESSION: 1. Mixed response to therapy since April: -11 enhancing metastases in both cerebral and cerebellar hemispheres are all slightly smaller. And cerebellar edema has resolved. 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 - And there are one, possibly two subcentimeter NEW left hemisphere white matter metastases (series 18, images 79 and 94). 2. Ongoing extensive T2 and FLAIR hyperintensity most resembling tumoral edema in the left hemisphere, progressed in the superior left frontal and parietal lobes, and now associated with 3 mm of rightward midline shift. 3. Negative for acute infarct. Electronically Signed   By: Odessa Fleming M.D.   On: 08/31/2022 04:12   DG Abdomen 1 View  Result Date: 08/31/2022 CLINICAL DATA:  Pre MRI clearance EXAM: ABDOMEN - 1 VIEW COMPARISON:  04/24/2022 FINDINGS: Vertebral augmentation is again noted at L4 and L1 as well as within the right sacrum and iliac bone. Bladder is filled with contrast material. No obstructive changes are seen. Prior pinning in the right hip is noted. No radiopaque foreign body is noted. IMPRESSION: No acute  abnormality noted.  No findings to preclude MRI scan. Electronically Signed   By: Alcide Clever M.D.   On: 08/31/2022 03:29   DG Chest Portable 1 View  Result Date: 08/31/2022 CLINICAL DATA:  Pre MRI clearance EXAM: PORTABLE CHEST 1 VIEW COMPARISON:  02/05/2021 FINDINGS: Cardiac shadow is stable. Right chest wall port is again seen and stable. The overall inspiratory effort is poor. Changes of prior vertebral augmentation are noted in the midthoracic spine. IMPRESSION: No acute abnormality noted. No retained foreign body to preclude MRI scanning. Electronically Signed   By: Alcide Clever M.D.   On: 08/31/2022 03:28   CT ANGIO HEAD NECK W WO CM  Result Date: 08/31/2022 CLINICAL DATA:  Initial  evaluation for neuro deficit, stroke suspected. EXAM: CT ANGIOGRAPHY HEAD AND NECK WITH AND WITHOUT CONTRAST TECHNIQUE: Multidetector CT imaging of the head and neck was performed using the standard protocol during bolus administration of intravenous contrast. Multiplanar CT image reconstructions and MIPs were obtained to evaluate the vascular anatomy. Carotid stenosis measurements (when applicable) are obtained utilizing NASCET criteria, using the distal internal carotid diameter as the denominator. RADIATION DOSE REDUCTION: This exam was performed according to the departmental dose-optimization program which includes automated exposure control, adjustment of the mA and/or kV according to patient size and/or use of iterative reconstruction technique. CONTRAST:  75mL OMNIPAQUE IOHEXOL 350 MG/ML SOLN COMPARISON:  Comparison made with prior CT from 08/30/2022. FINDINGS: CTA NECK FINDINGS Aortic arch: Visualized aortic arch normal caliber with standard branch pattern. No stenosis about the origin the great vessels. Right carotid system: Right common and internal carotid arteries are patent without stenosis or dissection. Left carotid system: Left common and internal carotid arteries are patent without stenosis or dissection. Vertebral arteries: Both vertebral arteries arise from subclavian arteries. No proximal subclavian artery stenosis. Right vertebral artery slightly dominant. Vertebral arteries patent without stenosis or dissection. Skeleton: Few scattered abnormal sclerotic lesion seen involving the visualized osseous structures, consistent with known osseous metastatic disease. Associated mild pathologic compression deformity at the inferior endplate of C5 without retropulsion. Other neck: No other acute finding. Upper chest: No other acute finding. Review of the MIP images confirms the above findings CTA HEAD FINDINGS Anterior circulation: Both internal carotid arteries are widely patent to the termini without  stenosis or other abnormality. Left A1 segment widely patent. Right A1 hypoplastic and/or absent. Normal anterior communicating complex. Anterior cerebral arteries deviated to the right but are widely patent without stenosis. No M1 stenosis or occlusion. No proximal MCA branch occlusion or high-grade stenosis. Distal MCA branches perfused and symmetric. Posterior circulation: Both V4 segments widely patent without stenosis. Both PICA patent. Basilar widely patent without stenosis. Superior cerebral arteries patent bilaterally. Left PCA primarily supplied via the basilar. Predominant fetal type origin of the right PCA. Both PCAs patent without stenosis. Venous sinuses: Patent allowing for timing the contrast bolus. Anatomic variants: Hypoplastic/absent right A1. Mildly dominant right vertebral artery. No intracranial aneurysm. Vasogenic edema involving the left greater than right cerebral hemispheres related to known metastatic disease, better characterized on prior CT. Review of the MIP images confirms the above findings IMPRESSION: 1. Negative CTA of the head and neck. No large vessel occlusion or other emergent finding. No hemodynamically significant or correctable stenosis. 2. Intracranial metastatic disease with associated vasogenic edema and mild left-to-right shift. Findings are better characterized on prior exams. 3. Scattered osseous metastatic disease. Associated mild pathologic compression deformity at the inferior endplate of C5 without retropulsion. Electronically Signed   By:  Rise Mu M.D.   On: 08/31/2022 02:21   CT HEAD WO CONTRAST ( )  Result Date: 08/30/2022 CLINICAL DATA:  Neuro deficit, acute, stroke suspected new aphasia, pt with known brain mets EXAM: CT HEAD WITHOUT CONTRAST TECHNIQUE: Contiguous axial images were obtained from the base of the skull through the vertex without intravenous contrast. RADIATION DOSE REDUCTION: This exam was performed according to the departmental  dose-optimization program which includes automated exposure control, adjustment of the mA and/or kV according to patient size and/or use of iterative reconstruction technique. COMPARISON:  06/12/2018, MRI 07/04/2022 FINDINGS: Brain: Extensive vasogenic edema throughout the left frontal lobe, extensive vasogenic edema throughout the cerebral hemispheres bilaterally, left greater than right, similar to prior study. The previously seen brain lesions by MRI not appreciated by noncontrast CT. No midline shift or hydrocephalus. No hemorrhage. Vascular: No hyperdense vessel or unexpected calcification. Skull: No acute calvarial abnormality. Sinuses/Orbits: No acute findings Other: None IMPRESSION: Extensive vasogenic edema as seen on prior study and compatible with the known metastatic disease present on prior imaging. This appears similar to prior study. Previously seen individual metastatic lesions by prior MRI not well visualized on this noncontrast CT. No hemorrhage, hydrocephalus or midline shift. Electronically Signed   By: Charlett Nose M.D.   On: 08/30/2022 23:46    Assessment and plan- Patient is a 42 y.o. 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  She has gone through 2 rounds of whole brain radiation and multiple changes in her systemic treatment for HER2 positive breast cancer.  Most recently about 2 months ago she was switched to Enhertu.  Despite that her present MRI brain shows mixed response to treatment and progression of leptomeningeal carcinomatosis.  Leptomeningeal carcinomatosis and breast cancer portends a poor prognosis and likely she has more disease than what is seen on her MRI.  Moreover with her recent decline in her neurological status  and the fact that she did not respond to Tucatinib Xeloda Herceptin regimen which has the best CNS penetration leaves [place] with no meaningful options at this time.  I think proceeding with home hospice is reasonable.  Her social situation is challenging because she lives with 3 of her sons in a single room in someone else's house.  Appreciate palliative care assisting with goals of care since it is a challenging situation and her oldest son who is 40 years of age will have to make decisions for her.  Patient does not have decision-making capacity.  She has resistant goals of care conversation in the past despite being followed by outpatient palliative care and social work.  From an oncology standpoint there is no further role for radiation or surgery.  No Further systemic therapy.  Her prognosis is likely less than 3 months.  Okay to discharge her on 4 mg twice daily of Decadron given presence of cerebral edema and midline shift noted on MRI.    Visit Diagnosis Metastatic breast cancer Goals of care counseling/discussion  Dr. Owens Shark, MD, MPH [facility] at [facility] [phone] 09/02/2022 7:02 PM

## 2022-09-03 MED ORDER — LEVETIRACETAM 500 MG PO TABS
500.0000 mg | ORAL_TABLET | Freq: Two times a day (BID) | ORAL | Status: DC
  Administered 2022-09-03 – 2022-09-13 (×20): 500 mg via ORAL
  Filled 2022-09-03 (×20): qty 1

## 2022-09-03 MED ORDER — DEXAMETHASONE 4 MG PO TABS
6.0000 mg | ORAL_TABLET | Freq: Every day | ORAL | Status: DC
  Administered 2022-09-04 – 2022-09-13 (×10): 6 mg via ORAL
  Filled 2022-09-03 (×10): qty 2

## 2022-09-03 NOTE — Progress Notes (Signed)
Sports coach Note  Hospital Liaison Team (HLT) continues to follow patient through discharge.  No family at bedside this morning.  I called and spoke with patient's son, Sophia [name], to arrange DME delivery.  Yesterday, he asked that we not set up DME delivery until after his Grandmother arrives from [place].  They expected her arrival late yesterday or today.  She has still not arrived in [place], per [name].    HLT will follow up tomorrow.   Continued collaboration with patient, family and hospital staff is ongoing through final disposition.   Please do not hesitate to call with any hospice related questions or concerns.   Thank you for allowing participation in this patients care.   Norris Cross, RN Nurse Liaison [phone]

## 2022-09-03 NOTE — Progress Notes (Signed)
Progress Note   Patient: Sophia [name] ION:[id number] DOB: 01-29-1981 DOA: 08/30/2022     3 DOS: the patient was seen and examined on 09/03/2022   Brief hospital course: Taken from H&P.   Shaianne Grossberg is a 42 y.o. 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    Patient is from [place] and lives here with her 3 kids ages between 16-11.  No family support.  Data reviewed independently and ED Course: pt was found to have WBC 2.9, GFR> 60, temperature normal, blood pressure 112/73, heart rate of 83, RR 18, oxygen saturation 100% on room air. CTA of head and neck negative for LVO.   MRI-brain w/ and w/o contrast: 1. Mixed response to therapy since April: -11 enhancing metastases in both cerebral and cerebellar hemispheres are all slightly smaller. And cerebellar edema has resolved. 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 - And there are one, possibly two subcentimeter NEW left hemisphere white matter metastases (series 18, images 79 and 94).   2. Ongoing extensive T2 and FLAIR hyperintensity most resembling tumoral edema in the left hemisphere, progressed in the superior left frontal and parietal lobes, and now associated with 3 mm of rightward midline shift.   3. Negative for acute infarct.   CT-head: Extensive vasogenic edema as seen on prior study and compatible with the known metastatic disease present on prior imaging. This appears similar to prior study.  EKG: Sinus rhythm, QTc 500, QT wave in lead III, low voltage, early R wave progression.  Oncology and palliative care from cancer center is on board.  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 If does not show good response in the upcoming days then she will need inpatient hospice due to lack of supportive care.  6/14: Hemodynamically stable, borderline sinus bradycardia.  Aphasia with some improvement, still having some word finding difficulty.  No nausea or vomiting.  Patient has a mother and sister in [place] to whom she can rely to take care of her kids.  6/15: Remains stable with improving mentation.  Mother flying in likely today.  Likely be discharged tomorrow once home hospice has been set up and mother is at home to take care of her and her kids.  6/16: Improved mental status.  Switching IV Decadron with p.o.  Awaiting for mother so hospice can set up equipment at home.   Assessment and Plan: * Aphasia Acute metabolic encephalopathy.  Likely secondary to metastatic disease to brain.  Patient had a small midline shift and concern of some vasogenic edema. Her oncologist was consulted and according to  [name], overall prognosis is poor.  There is no role for surgery or radiation.  Patient was started on IV Decadron and Keppra. Seems to have some improvement and currently stable. Palliative care from cancer center is on board. Patient has 3 young kids with no family or community support locally. -Switching IV Decadron and Keppra with p.o.  Breast cancer metastasized to brain [facility]) S/p whole brain radiation.  Currently on chemotherapy.  She was due today and dose was canceled. -They will resume chemo if functional status improves, otherwise she will be a candidate for hospice facility  Neoplasm related  pain -Continue MS Contin 30 mg twice daily -As needed as needed Dilaudid and Percocet  GERD (gastroesophageal reflux disease) -Continue Protonix  Depression with anxiety -Continue home meds      Subjective: Patient continued to have improving mentation.  She was complaining of bilateral foot pain.  Not sure when mother will be here.  A friend  at bedside  Physical Exam: Vitals:   09/02/22 2106 09/03/22 0453 09/03/22 0811 09/03/22 1206  BP: 99/64 128/85 (!) 142/96 (!) 170/104  Pulse: (!) 57 77 (!) 45 (!) 52  Resp:  16 16 16   Temp:  97.6 F (36.4 C) (!) 97.5 F (36.4 C) 97.8 F (36.6 C)  TempSrc:      SpO2: 100% 100% 100% 97%   General.  Frail lady, in no acute distress. Pulmonary.  Lungs clear bilaterally, normal respiratory effort. CV.  Regular rate and rhythm, no JVD, rub or murmur. Abdomen.  Soft, nontender, nondistended, BS positive. CNS.  Alert and oriented .  No focal neurologic deficit. Extremities.  No edema, no cyanosis, pulses intact and symmetrical. Psychiatry.  Judgment and insight appears normal.   Data Reviewed: Prior data reviewed  Family Communication: Discussed with the friend at bedside  Disposition: Status is: Inpatient Remains inpatient appropriate because: Severity of illness  Planned Discharge Destination: Home with hospice-awaiting mother to come from [place] before discharge.  DVT prophylaxis.  Subcu heparin Time spent: 44 minutes  This record has been created using Conservation officer, historic buildings. Errors have been sought and corrected,but may not always be located. Such creation errors do not reflect on the standard of care.   Author: Arnetha Courser, MD 09/03/2022 3:01 PM  For on call review [email or web address].

## 2022-09-03 NOTE — Assessment & Plan Note (Signed)
Acute metabolic encephalopathy.  Likely secondary to metastatic disease to brain.  Patient had a small midline shift and concern of some vasogenic edema. Her oncologist was consulted and according to  [name], overall prognosis is poor.  There is no role for surgery or radiation.  Patient was started on IV Decadron and Keppra. Seems to have some improvement and currently stable. Palliative care from cancer center is on board. Patient has 3 young kids with no family or community support locally. -Switching IV Decadron and Keppra with p.o.

## 2022-09-04 ENCOUNTER — Telehealth: Payer: Self-pay

## 2022-09-04 NOTE — Progress Notes (Signed)
[facility]- [employer], [employer] ([employer])  HL spoke with patient's son, Clint Lipps, today.  He reported that he is going to call the patient's mother this evening to find out when she will arrive in [place].     Please don't hesitate to call with any Hospice related questions or concerns.    Thank you for the opportunity to participate in this patient's care. [facility] Liaison [phone]

## 2022-09-04 NOTE — Progress Notes (Signed)
   09/04/22 1200  Spiritual Encounters  Type of Visit Follow up  Care provided to: Family  Referral source Chaplain team  Reason for visit Routine spiritual support  OnCall Visit No  Spiritual Framework  Presenting Themes Coping tools;Courage hope and growth  Community/Connection Family;Friend(s);Faith community  Patient Stress Factors Major life changes  Family Stress Factors None identified  Interventions  Spiritual Care Interventions Made Compassionate presence;Reflective listening;Encouragement  Intervention Outcomes  Outcomes Connection to spiritual care;Awareness of support  Spiritual Care Plan  Spiritual Care Issues Still Outstanding Chaplain will continue to follow   Chaplain went for a follow up visit to check on the patient and family. Spoke with patients son who said she is doing better and will be able to go home soon. Son said that his grandmother is flying in today and that they will be able to take mom home. Son is feeling extremely hopeful and is also excited that he is getting baptized in a couple of weeks. Chaplain will continue to follow up if needed.

## 2022-09-04 NOTE — Progress Notes (Signed)
Progress Note   Patient: Sophia [name] ZOX:[id number] DOB: [birth year] DOA: 08/30/2022     4 DOS: the patient was seen and examined on 09/04/2022   Brief hospital course: Taken from H&P.   Sophia [name] is a 42 y.o. 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    Patient is from [place] and lives here with her 3 kids ages between 62-11.  No family support.  Data reviewed independently and ED Course: pt was found to have WBC 2.9, GFR> 60, temperature normal, blood pressure 112/73, heart rate of 83, RR 18, oxygen saturation 100% on room air. CTA of head and neck negative for LVO.   MRI-brain w/ and w/o contrast: 1. Mixed response to therapy since April: -11 enhancing metastases in both cerebral and cerebellar hemispheres are all slightly smaller. And cerebellar edema has resolved. 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 - And there are one, possibly two subcentimeter NEW left hemisphere white matter metastases (series 18, images 79 and 94).   2. Ongoing extensive T2 and FLAIR hyperintensity most resembling tumoral edema in the left hemisphere, progressed in the superior left frontal and parietal lobes, and now associated with 3 mm of rightward midline shift.   3. Negative for acute infarct.   CT-head: Extensive vasogenic edema as seen on prior study and compatible with the known metastatic disease present on prior imaging. This appears similar to prior study.  EKG: Sinus rhythm, QTc 500, QT wave in lead III, low voltage, early R wave progression.  Oncology and palliative care from cancer center is on board.  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 If does not show good response in the upcoming days then she will need inpatient hospice due to lack of supportive care.  6/14: Hemodynamically stable, borderline sinus bradycardia.  Aphasia with some improvement, still having some word finding difficulty.  No nausea or vomiting.  Patient has a mother and sister in [place] to whom she can rely to take care of her kids.  6/15: Remains stable with improving mentation.  Mother flying in likely today.  Likely be discharged tomorrow once home hospice has been set up and mother is at home to take care of her and her kids.  6/16: Improved mental status.  Switching IV Decadron with p.o.  Awaiting for mother so hospice can set up equipment at home.  6/17: Remained stable.  Awaiting mother to arrive before discharge home with hospice. Very poor prognosis   Assessment and Plan: * Aphasia Acute metabolic encephalopathy.  Likely secondary to metastatic disease to brain.  Patient had a small midline shift and concern of some vasogenic edema. Her oncologist was consulted and according to  [name], overall prognosis is poor.  There is no role for surgery or radiation.  Patient was started on IV Decadron and Keppra. Seems to have some improvement and currently stable. Palliative care from cancer center is on board. Patient has 3 young kids with no family or community support locally. -Switching IV Decadron and Keppra with p.o.  Breast cancer metastasized to brain [facility]) S/p whole brain radiation.  Currently on chemotherapy.  She was due today and dose was canceled. -They will resume  chemo if functional status improves, otherwise she will be a candidate for hospice facility  Neoplasm related pain -Continue MS Contin 30 mg twice daily -As needed as needed Dilaudid and Percocet  GERD (gastroesophageal reflux disease) -Continue Protonix  Depression with anxiety -Continue home meds      Subjective: Patient was seen and examined  today.  No new concern.  Son at bedside, stating her grandmother is not here yet but likely be arriving later today  Physical Exam: Vitals:   09/04/22 0138 09/04/22 0515 09/04/22 0842 09/04/22 0911  BP: 119/79 110/64 132/87   Pulse: (!) 44 (!) 47    Resp: 18 18 15 18   Temp: 97.9 F (36.6 C) 98.1 F (36.7 C) 98.4 F (36.9 C)   TempSrc:   Oral   SpO2: 98% 98% 98%    General.  Malnourished lady, in no acute distress. Pulmonary.  Lungs clear bilaterally, normal respiratory effort. CV.  Regular rate and rhythm, no JVD, rub or murmur. Abdomen.  Soft, nontender, nondistended, BS positive. CNS.  Alert and oriented .  No focal neurologic deficit. Extremities.  No edema, no cyanosis, pulses intact and symmetrical. Psychiatry.  Judgment and insight appears normal.   Data Reviewed: Prior data reviewed  Family Communication: Discussed with son at bedside  Disposition: Status is: Inpatient Remains inpatient appropriate because: Severity of illness  Planned Discharge Destination: Home with hospice-awaiting mother to come from [place] before discharge.  DVT prophylaxis.  Subcu heparin Time spent: 40 minutes  This record has been created using Conservation officer, historic buildings. Errors have been sought and corrected,but may not always be located. Such creation errors do not reflect on the standard of care.   Author: Arnetha Courser, MD 09/04/2022 4:09 PM  For on call review [email or web address].

## 2022-09-04 NOTE — Telephone Encounter (Signed)
CSW attempted to contact patient's son, Clint Lipps, as follow up.  His vm has not been set up yet and CSW could not leave a message.

## 2022-09-05 LAB — GLUCOSE, CAPILLARY: Glucose-Capillary: 141 mg/dL — ABNORMAL HIGH (ref 70–99)

## 2022-09-05 MED ORDER — SODIUM CHLORIDE 0.9 % IV BOLUS
250.0000 mL | Freq: Once | INTRAVENOUS | Status: AC
  Administered 2022-09-05: 250 mL via INTRAVENOUS

## 2022-09-05 NOTE — Progress Notes (Signed)
Progress Note   Patient: Sophia [name] ZOX:[id number] DOB: May 22, 1980 DOA: 08/30/2022     5 DOS: the patient was seen and examined on 09/05/2022   Brief hospital course: Taken from H&P.   Sophia [name] is a 42 y.o. 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    Patient is from [place] and lives here with her 3 kids ages between 58-11.  No family support.  Data reviewed independently and ED Course: pt was found to have WBC 2.9, GFR> 60, temperature normal, blood pressure 112/73, heart rate of 83, RR 18, oxygen saturation 100% on room air. CTA of head and neck negative for LVO.   MRI-brain w/ and w/o contrast: 1. Mixed response to therapy since April: -11 enhancing metastases in both cerebral and cerebellar hemispheres are all slightly smaller. And cerebellar edema has resolved. 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 - And there are one, possibly two subcentimeter NEW left hemisphere white matter metastases (series 18, images 79 and 94).   2. Ongoing extensive T2 and FLAIR hyperintensity most resembling tumoral edema in the left hemisphere, progressed in the superior left frontal and parietal lobes, and now associated with 3 mm of rightward midline shift.   3. Negative for acute infarct.   CT-head: Extensive vasogenic edema as seen on prior study and compatible with the known metastatic disease present on prior imaging. This appears similar to prior study.  EKG: Sinus rhythm, QTc 500, QT wave in lead III, low voltage, early R wave progression.  Oncology and palliative care from cancer center is on board.  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 If does not show good response in the upcoming days then she will need inpatient hospice due to lack of supportive care.  6/14: Hemodynamically stable, borderline sinus bradycardia.  Aphasia with some improvement, still having some word finding difficulty.  No nausea or vomiting.  Patient has a mother and sister in [place] to whom she can rely to take care of her kids.  6/15: Remains stable with improving mentation.  Mother flying in likely today.  Likely be discharged tomorrow once home hospice has been set up and mother is at home to take care of her and her kids.  6/16: Improved mental status.  Switching IV Decadron with p.o.  Awaiting for mother so hospice can set up equipment at home.  6/17: Remained stable.  Awaiting mother to arrive before discharge home with hospice. Very poor prognosis.  6/18: Remained stable.  Still no definitive news about mother's travel plans.  Another message sent to hospice and palliative care from cancer center.  Awaiting discharge so she can be with her and kids at home.   Assessment and Plan: * Aphasia Acute metabolic encephalopathy.  Likely secondary to metastatic disease to brain.  Patient had a small midline shift and concern of some vasogenic edema. Her oncologist was consulted and according to  [name], overall prognosis is poor.  There is no role for surgery or radiation.  Patient was started on IV Decadron and Keppra. Seems to have some improvement and currently stable. Palliative care from cancer center is on board. Patient has 3 young kids with no family  or community support locally. -Switching IV Decadron and Keppra with p.o.  Breast cancer metastasized to brain [facility]) S/p whole brain radiation.  Currently on chemotherapy.  She was due today and dose was canceled. -They will resume chemo if functional status improves, otherwise she will be a candidate for hospice facility  Neoplasm related pain -Continue MS Contin 30 mg  twice daily -As needed as needed Dilaudid and Percocet  GERD (gastroesophageal reflux disease) -Continue Protonix  Depression with anxiety -Continue home meds      Subjective: Patient with no new complaints.  She wants dysphagia diet to switch with regular stating that she is feeling much improved and would like to take regular diet.  She still not sure about her mother's travel plan.  Physical Exam: Vitals:   09/04/22 0911 09/04/22 1631 09/04/22 2028 09/05/22 0506  BP:  106/77 127/87 112/74  Pulse:   (!) 54 (!) 53  Resp: 18 16 18 17   Temp:  98.4 F (36.9 C) 98.6 F (37 C) 97.9 F (36.6 C)  TempSrc:  Oral Oral Oral  SpO2:  97% 98% 100%   General.  Malnourished lady, in no acute distress. Pulmonary.  Lungs clear bilaterally, normal respiratory effort. CV.  Regular rate and rhythm, no JVD, rub or murmur. Abdomen.  Soft, nontender, nondistended, BS positive. CNS.  Alert and oriented .  No focal neurologic deficit. Extremities.  No edema, no cyanosis, pulses intact and symmetrical. Psychiatry.  Judgment and insight appears normal.   Data Reviewed: Prior data reviewed  Family Communication: Discussed with mother, younger son at bedside  Disposition: Status is: Inpatient Remains inpatient appropriate because: Severity of illness  Planned Discharge Destination: Home with hospice-awaiting mother to come from [place] before discharge.  DVT prophylaxis.  Subcu heparin Time spent: 39 minutes  This record has been created using Conservation officer, historic buildings. Errors have been sought and corrected,but may not always be located. Such creation errors do not reflect on the standard of care.   Author: Arnetha Courser, MD 09/05/2022 3:30 PM  For on call review [email or web address].

## 2022-09-05 NOTE — Progress Notes (Signed)
Nutrition Brief Note  Chart reviewed. Per palliative care notes, no further available treatment options available to the patient. Plan to transition to home with hospice services (inpatient hospice provided, however, family would like to take pt home once family from [place] arrives).  No further nutrition interventions planned at this time.  Please re-consult as needed.   Levada Schilling, RD, LDN, CDCES Registered Dietitian II Certified Diabetes Care and Education Specialist Please refer to [facility] for RD and/or RD on-call/weekend/after hours pager

## 2022-09-06 LAB — GLUCOSE, CAPILLARY: Glucose-Capillary: 81 mg/dL (ref 70–99)

## 2022-09-06 LAB — TROPONIN I (HIGH SENSITIVITY)
Troponin I (High Sensitivity): <2 ng/L (ref <18)
Troponin I (High Sensitivity): 4 ng/L (ref <18)

## 2022-09-06 MED ORDER — ENOXAPARIN SODIUM 40 MG/0.4ML IJ SOSY
40.0000 mg | PREFILLED_SYRINGE | Freq: Every evening | INTRAMUSCULAR | Status: DC
  Administered 2022-09-06 – 2022-09-12 (×7): 40 mg via SUBCUTANEOUS
  Filled 2022-09-06 (×7): qty 0.4

## 2022-09-06 MED ORDER — MORPHINE SULFATE (PF) 2 MG/ML IV SOLN
2.0000 mg | INTRAVENOUS | Status: DC | PRN
  Administered 2022-09-06 – 2022-09-12 (×2): 2 mg via INTRAVENOUS
  Filled 2022-09-06 (×2): qty 1

## 2022-09-06 NOTE — Progress Notes (Addendum)
[facility] Progress Note  Patient: Sophia [name]    XBJ:[id number]  DOA: 08/30/2022     Date of Service: the patient was seen and examined on 09/06/2022  Chief Complaint  Patient presents with   Altered Mental Status   Brief hospital course: Sophia [name] is a 42 y.o. 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     Patient is from [place] and lives here with her 3 kids ages between 59-11.  No family support.   Data reviewed independently and ED Course: pt was found to have WBC 2.9, GFR> 60, temperature normal, blood pressure 112/73, heart rate of 83, RR 18, oxygen saturation 100% on room air. CTA of head and neck negative for LVO.    MRI-brain w/ and w/o contrast: 1. Mixed response to therapy since April: -11 enhancing metastases in both cerebral and cerebellar hemispheres are all slightly smaller. And cerebellar edema has resolved. 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 - And there are one, possibly two subcentimeter NEW left hemisphere white matter metastases (series 18, images 79 and 94).   2. Ongoing extensive T2 and FLAIR hyperintensity most resembling tumoral edema in the left hemisphere, progressed in the superior left frontal and parietal lobes, and now associated with 3 mm of rightward midline shift.   3. Negative for acute infarct.   CT-head: Extensive vasogenic edema as seen on prior study and compatible with the known metastatic disease present on prior imaging. This appears similar to prior study.   EKG: Sinus rhythm, QTc 500, QT wave in lead III, low voltage, early R wave progression.   Oncology and palliative care from cancer center  is on board.   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 If does not show good response in the upcoming days then she will need inpatient hospice due to lack of supportive care.  Assessment and Plan:  Aphasia Acute metabolic encephalopathy.  Likely secondary to metastatic disease to brain.  Patient had a small midline shift and concern of some vasogenic edema. Her oncologist was consulted and according to  [name], overall prognosis is poor.  There is no role for surgery or radiation.  S/p IV Decadron and Keppra.  Continue Decadron 6 mg p.o. daily and Keppra 500 mg p.o. twice daily Seems to have some improvement and currently stable. Palliative care from cancer center is on board. Patient has 3 young kids with no family or community support locally.   Breast cancer metastasized to brain [facility]) S/p whole brain radiation. S/p chemotherapy, which was canceled. -They will resume chemo if functional status improves, otherwise she will be a candidate for hospice facility   Neoplasm related pain -Continue MS Contin 30 mg twice daily -As needed as needed Dilaudid and Percocet   GERD (gastroesophageal reflux disease) -Continue Protonix   Depression with anxiety -Continue home meds  There is no height or weight on file to calculate BMI.  Nutrition Problem: Increased nutrient needs Etiology: cancer and cancer related treatments Interventions: Interventions: Ensure Enlive (each supplement provides 350kcal and 20 grams of protein), MVI   Diet: Regular diet DVT Prophylaxis: Subcutaneous Lovenox   Advance goals of care discussion: DNR  Family Communication: family  was present at bedside, at the time of interview.  The pt provided permission to discuss medical plan with the family. Opportunity was given to ask question and all questions were answered satisfactorily.   Disposition:  Pt is from Home, admitted with breast cancer, metastasis to brain.  Awaiting  for family to come from [place], which precludes a safe discharge. Discharge to home with hospice services, when family will be here from [place] to take care of her and her kids..  Subjective: No significant events overnight, patient was lying comfortably, denies any specific complaints.  Physical Exam: General: NAD, lying comfortably Appear in no distress, affect appropriate Eyes: PERRLA ENT: Oral Mucosa Clear, moist  Neck: no JVD,  Cardiovascular: S1 and S2 Present, no Murmur,  Respiratory: good respiratory effort, Bilateral Air entry equal and Decreased, no Crackles, no wheezes Abdomen: Bowel Sound present, Soft and no tenderness,  Skin: no rashes Extremities: no Pedal edema, no calf tenderness Neurologic: without any new focal findings Gait not checked due to patient safety concerns  Vitals:   09/06/22 0420 09/06/22 0627 09/06/22 0726 09/06/22 1159  BP: 99/61 139/81 (!) 148/87 116/77  Pulse: (!) 45 (!) 42 (!) 41 (!) 53  Resp: 18 18 17 17   Temp: 98.1 F (36.7 C) 98.6 F (37 C) 97.8 F (36.6 C) 98.3 F (36.8 C)  TempSrc: Oral  Oral Oral  SpO2: 98% 100% 100% 98%    Intake/Output Summary (Last 24 hours) at 09/06/2022 1457 Last data filed at 09/06/2022 1416 Gross per 24 hour  Intake 1657.6 ml  Output --  Net 1657.6 ml   There were no vitals filed for this visit.  Data Reviewed: I have personally reviewed and interpreted daily labs, tele strips, imagings as discussed above. I reviewed all nursing notes, pharmacy notes, vitals, pertinent old records I have discussed plan of care as described above with RN and patient/family.  CBC: Recent Labs  Lab 08/30/22 2357 08/31/22 0512  WBC 2.9* 2.5*  NEUTROABS 1.4*  --   HGB 12.1 11.3*  HCT 37.6 34.5*  MCV 85.1 82.7  PLT 458* 451*   Basic Metabolic Panel: Recent Labs  Lab 08/30/22 2357 08/31/22 0512  NA 138 139  K 3.8 3.8  CL 104 108  CO2 23 23  GLUCOSE 89 96  BUN 8 7  CREATININE 0.53 0.45  CALCIUM 8.7* 8.8*   MG 2.3  --     Studies: No results found.  Scheduled Meds:  calcium-vitamin D  1 tablet Oral BID   Chlorhexidine Gluconate Cloth  6 each Topical Daily   dexamethasone  6 mg Oral Daily   DULoxetine  60 mg Oral Daily   feeding supplement  237 mL Oral BID BM   gabapentin  600 mg Oral TID   heparin injection (subcutaneous)  5,000 Units Subcutaneous Q8H   levETIRAcetam  500 mg Oral BID   loratadine  10 mg Oral Daily   morphine  30 mg Oral Q12H   multivitamin with minerals  1 tablet Oral Daily   pantoprazole  40 mg Oral Daily   sodium chloride flush  10-40 mL Intracatheter Q12H   Continuous Infusions:  sodium chloride 100 mL/hr at 09/06/22 1407   PRN Meds: acetaminophen **OR** acetaminophen, gadobutrol, HYDROmorphone (DILAUDID) injection, LORazepam, LORazepam, magnesium hydroxide, morphine injection, OLANZapine, ondansetron **OR** ondansetron (ZOFRAN) IV, oxyCODONE-acetaminophen, sodium chloride flush, traZODone  Time spent: 35 minutes  Author: Gillis Santa. MD [facility] 09/06/2022 2:57 PM  To reach On-call, see care teams to  locate the attending and reach out to them via [email or web address]. If 7PM-7AM, please contact night-coverage If you still have difficulty reaching the attending provider, please page the [facility] (Director on Call) for [facility] on amion for assistance.

## 2022-09-06 NOTE — Plan of Care (Signed)

## 2022-09-06 NOTE — Progress Notes (Signed)
MEWS Progress Note  Patient Details Name: Sophia [name] MRN: [id number] DOB: 1980-11-24 Today's Date: 09/06/2022   MEWS Flowsheet Documentation:  Assess: MEWS Score Temp: 98.1 F (36.7 C) BP: 99/61 MAP (mmHg): 73 Pulse Rate: (!) 45 Resp: 18 Level of Consciousness: Alert SpO2: 98 % O2 Device: Room Air Assess: MEWS Score MEWS Temp: 0 MEWS Systolic: 1 MEWS Pulse: 1 MEWS RR: 0 MEWS LOC: 0 MEWS Score: 2 MEWS Score Color: Yellow Assess: SIRS CRITERIA SIRS Temperature : 0 SIRS Respirations : 0 SIRS Pulse: 0 SIRS WBC: 0 SIRS Score Sum : 0 SIRS Temperature : 0 SIRS Pulse: 0 SIRS Respirations : 0 SIRS WBC: 0 SIRS Score Sum : 0 Assess: if the MEWS score is Yellow or Red Were vital signs taken at a resting state?: Yes Focused Assessment: Change from prior assessment (see assessment flowsheet) Does the patient meet 2 or more of the SIRS criteria?: No MEWS guidelines implemented : Yes, yellow Treat MEWS Interventions: Considered administering scheduled or prn medications/treatments as ordered Take Vital Signs Increase Vital Sign Frequency : Yellow: Q2hr x1, continue Q4hrs until patient remains green for 12hrs Escalate MEWS: Escalate: Yellow: Discuss with charge nurse and consider notifying provider and/or RRT Notify: Charge Nurse/RN Name of Charge Nurse/RN Notified: Lupita Leash, RN Provider Notification Provider Name/Title: Andrez Grime, MD Date Provider Notified: 09/06/22 Time Provider Notified: [phone] Method of Notification: Page, Face-to-face Notification Reason: Change in status (Chest tightness, with slight SOB) Provider response: See new orders      Ernest Mallick 09/06/2022, 5:22 AM

## 2022-09-07 ENCOUNTER — Ambulatory Visit: Payer: Self-pay

## 2022-09-07 LAB — GLUCOSE, CAPILLARY
Glucose-Capillary: 82 mg/dL (ref 70–99)
Glucose-Capillary: 112 mg/dL — ABNORMAL HIGH (ref 70–99)

## 2022-09-07 NOTE — Progress Notes (Signed)
[facility] Progress Note  Patient: Sophia [name]    ZOX:[id number]  DOA: 08/30/2022     Date of Service: the patient was seen and examined on 09/07/2022  Chief Complaint  Patient presents with   Altered Mental Status   Brief hospital course: Sophia [name] is a 42 y.o. 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     Patient is from [place] and lives here with her 3 kids ages between 74-11.  No family support.   Data reviewed independently and ED Course: pt was found to have WBC 2.9, GFR> 60, temperature normal, blood pressure 112/73, heart rate of 83, RR 18, oxygen saturation 100% on room air. CTA of head and neck negative for LVO.    MRI-brain w/ and w/o contrast: 1. Mixed response to therapy since April: -11 enhancing metastases in both cerebral and cerebellar hemispheres are all slightly smaller. And cerebellar edema has resolved. 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 - And there are one, possibly two subcentimeter NEW left hemisphere white matter metastases (series 18, images 79 and 94).   2. Ongoing extensive T2 and FLAIR hyperintensity most resembling tumoral edema in the left hemisphere, progressed in the superior left frontal and parietal lobes, and now associated with 3 mm of rightward midline shift.   3. Negative for acute infarct.   CT-head: Extensive vasogenic edema as seen on prior study and compatible with the known metastatic disease present on prior imaging. This appears similar to prior study.   EKG: Sinus rhythm, QTc 500, QT wave in lead III, low voltage, early R wave progression.   Oncology and palliative care from cancer center  is on board.   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 If does not show good response in the upcoming days then she will need inpatient hospice due to lack of supportive care.  Assessment and Plan:  Aphasia Acute metabolic encephalopathy.  Likely secondary to metastatic disease to brain.  Patient had a small midline shift and concern of some vasogenic edema. Her oncologist was consulted and according to  [name], overall prognosis is poor.  There is no role for surgery or radiation.  S/p IV Decadron and Keppra.  Continue Decadron 6 mg p.o. daily and Keppra 500 mg p.o. twice daily Seems to have some improvement and currently stable. Palliative care from cancer center is on board. Patient has 3 young kids with no family or community support locally.   Breast cancer metastasized to brain [facility]) S/p whole brain radiation. S/p chemotherapy, which was canceled. -They will resume chemo if functional status improves, otherwise she will be a candidate for hospice facility   Neoplasm related pain -Continue MS Contin 30 mg twice daily -As needed as needed Dilaudid and Percocet   GERD (gastroesophageal reflux disease) -Continue Protonix   Depression with anxiety -Continue home meds  There is no height or weight on file to calculate BMI.  Nutrition Problem: Increased nutrient needs Etiology: cancer and cancer related treatments Interventions: Interventions: Ensure Enlive (each supplement provides 350kcal and 20 grams of protein), MVI   Diet: Regular diet DVT Prophylaxis: Subcutaneous Lovenox   Advance goals of care discussion: DNR  Family Communication: family  was present at bedside, at the time of interview.  The pt provided permission to discuss medical plan with the family. Opportunity was given to ask question and all questions were answered satisfactorily.   Disposition:  Pt is from Home, admitted with breast cancer, metastasis to brain.  Awaiting  for family to come from [place], which precludes a safe discharge. Discharge to home with hospice services, when family will be here from [place] to take care of her and her kids. 6/20 patient is not sure when her systolic come from [place]. Follow TOC for disposition plan  Subjective: No significant events overnight, patient was lying comfortably, denies any specific complaints.  Physical Exam: General: NAD, lying comfortably Appear in no distress, affect appropriate Eyes: PERRLA ENT: Oral Mucosa Clear, moist  Neck: no JVD,  Cardiovascular: S1 and S2 Present, no Murmur,  Respiratory: good respiratory effort, Bilateral Air entry equal and Decreased, no Crackles, no wheezes Abdomen: Bowel Sound present, Soft and no tenderness,  Skin: no rashes Extremities: no Pedal edema, no calf tenderness Neurologic: without any new focal findings Gait not checked due to patient safety concerns  Vitals:   09/07/22 0000 09/07/22 0417 09/07/22 0802 09/07/22 1145  BP: (!) 156/84 (!) 140/90 118/72 98/66  Pulse: (!) 46 (!) 49 (!) 49 62  Resp: 17 17 16 18   Temp: 98.7 F (37.1 C) 98.7 F (37.1 C) 98.3 F (36.8 C)   TempSrc: Oral Oral    SpO2: 97% 95% 97% 99%    Intake/Output Summary (Last 24 hours) at 09/07/2022 1517 Last data filed at 09/07/2022 1500 Gross per 24 hour  Intake 2933.86 ml  Output --  Net 2933.86 ml   There were no vitals filed for this visit.  Data Reviewed: I have personally reviewed and interpreted daily labs, tele strips, imagings as discussed above. I reviewed all nursing notes, pharmacy notes, vitals, pertinent old records I have discussed plan of care as described above with RN and patient/family.  CBC: No results for input(s): "WBC", "NEUTROABS", "HGB", "HCT", "MCV", "PLT" in the last 168 hours.  Basic Metabolic Panel: No results for input(s): "NA", "K", "CL", "CO2", "GLUCOSE", "BUN", "CREATININE", "CALCIUM", "MG", "PHOS" in the last 168 hours.   Studies: No results  found.  Scheduled Meds:  calcium-vitamin D  1 tablet Oral BID   Chlorhexidine Gluconate Cloth  6 each Topical Daily   dexamethasone  6 mg Oral Daily   DULoxetine  60 mg Oral Daily   enoxaparin (LOVENOX) injection  40 mg Subcutaneous QPM   feeding supplement  237 mL Oral BID BM   gabapentin  600 mg Oral TID   levETIRAcetam  500 mg Oral BID   loratadine  10 mg Oral Daily   morphine  30 mg Oral Q12H   multivitamin with minerals  1 tablet Oral Daily   pantoprazole  40 mg Oral Daily   sodium chloride flush  10-40 mL Intracatheter Q12H   Continuous Infusions:  sodium chloride 100 mL/hr at 09/07/22 0015   PRN Meds: acetaminophen **OR** acetaminophen, gadobutrol, HYDROmorphone (DILAUDID) injection, LORazepam, LORazepam, magnesium hydroxide, morphine injection, OLANZapine, ondansetron **OR** ondansetron (ZOFRAN) IV, oxyCODONE-acetaminophen, sodium chloride flush, traZODone  Time spent: 35 minutes  Author: Gillis Santa. MD [facility] 09/07/2022 3:17 PM  To reach On-call, see care teams to locate the attending and reach out to them via [email or web address]. If 7PM-7AM, please contact night-coverage If you still have difficulty reaching the attending provider, please page the [facility] (Director on Call) for [facility] on  amion for assistance.

## 2022-09-07 NOTE — TOC Progression Note (Addendum)
Transition of Care [facility]) - Progression Note    Patient Details  Name: Sophia [name] MRN: [id number] Date of Birth: 05/10/1980  [facility]) CM/SW Contact  Allena Katz, LCSW Phone Number: 09/07/2022, 9:42 AM  Clinical Narrative:    CSW attempted to call patients son and patients friend. CSW unable to reach son to set up meeting with him and hospice as his VM box is full and CSW unable to leave a message. CSW sent message to RN to notify myself and jack when son arrives.    11:11AM CSW spoke with patients friend who reports she is unsure the status of patients mother returning to the [place]. She does not have her contact info but states her name is [place]. Friend reports that she does not know if It has been communicated to mom that she would have to provide care for patient around the clock at home. Friend gave CSW contact number for sister, Olene Craven, in [place], who is helping coordinate. [phone] ( International number).   11:37am  CSW made CPS report to ensure plan was made for children upon passing of mom.  Moms number is [phone]   12:36PM  CSW and hospice liaison Ree Kida spoke with patients Mother regarding discharge plan. Mom reports she has a ticket on July 1st to come in and care for patient.     Expected Discharge Plan and Services                                               Social Determinants of Health (SDOH) Interventions SDOH Screenings   Food Insecurity: No Food Insecurity (08/31/2022)  Recent Concern: Food Insecurity - Food Insecurity Present (06/23/2022)  Housing: Low Risk  (08/31/2022)  Recent Concern: Housing - High Risk (06/23/2022)  Transportation Needs: No Transportation Needs (08/31/2022)  Recent Concern: Transportation Needs - Unmet Transportation Needs (07/21/2022)  Utilities: Not At Risk (08/31/2022)  Alcohol Screen: Low Risk  (11/18/2021)  Depression (PHQ2-9): Medium Risk (06/23/2022)  Financial  Resource Strain: High Risk (11/18/2021)  Physical Activity: Inactive (11/18/2021)  Social Connections: Socially Isolated (06/23/2022)  Stress: Stress Concern Present (11/18/2021)  Tobacco Use: Low Risk  (08/31/2022)    Readmission Risk Interventions     No data to display

## 2022-09-07 NOTE — Plan of Care (Signed)

## 2022-09-08 ENCOUNTER — Other Ambulatory Visit: Payer: Self-pay

## 2022-09-08 LAB — GLUCOSE, CAPILLARY: Glucose-Capillary: 78 mg/dL (ref 70–99)

## 2022-09-08 NOTE — Progress Notes (Signed)
[facility] Progress Note  Patient: Sophia [name]    QMV:[id number]  DOA: 08/30/2022     Date of Service: the patient was seen and examined on 09/08/2022  Chief Complaint  Patient presents with   Altered Mental Status   Brief hospital course: Sophia [name] is a 42 y.o. 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     Patient is from [place] and lives here with her 3 kids ages between 75-11.  No family support.   Data reviewed independently and ED Course: pt was found to have WBC 2.9, GFR> 60, temperature normal, blood pressure 112/73, heart rate of 83, RR 18, oxygen saturation 100% on room air. CTA of head and neck negative for LVO.    MRI-brain w/ and w/o contrast: 1. Mixed response to therapy since April: -11 enhancing metastases in both cerebral and cerebellar hemispheres are all slightly smaller. And cerebellar edema has resolved. 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 - And there are one, possibly two subcentimeter NEW left hemisphere white matter metastases (series 18, images 79 and 94).   2. Ongoing extensive T2 and FLAIR hyperintensity most resembling tumoral edema in the left hemisphere, progressed in the superior left frontal and parietal lobes, and now associated with 3 mm of rightward midline shift.   3. Negative for acute infarct.   CT-head: Extensive vasogenic edema as seen on prior study and compatible with the known metastatic disease present on prior imaging. This appears similar to prior study.   EKG: Sinus rhythm, QTc 500, QT wave in lead III, low voltage, early R wave progression.   Oncology and palliative care from cancer center  is on board.   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 If does not show good response in the upcoming days then she will need inpatient hospice due to lack of supportive care.  Assessment and Plan:  Aphasia Acute metabolic encephalopathy.  Likely secondary to metastatic disease to brain.  Patient had a small midline shift and concern of some vasogenic edema. Her oncologist was consulted and according to  [name], overall prognosis is poor.  There is no role for surgery or radiation.  S/p IV Decadron and Keppra.  Continue Decadron 6 mg p.o. daily and Keppra 500 mg p.o. twice daily Seems to have some improvement and currently stable. Palliative care from cancer center is on board. Patient has 3 young kids with no family or community support locally.   Breast cancer metastasized to brain [facility]) S/p whole brain radiation. S/p chemotherapy, which was canceled. -They will resume chemo if functional status improves, otherwise she will be a candidate for hospice facility   Neoplasm related pain -Continue MS Contin 30 mg twice daily -As needed as needed Dilaudid and Percocet   GERD (gastroesophageal reflux disease) -Continue Protonix   Depression with anxiety -Continue home meds  There is no height or weight on file to calculate BMI.  Nutrition Problem: Increased nutrient needs Etiology: cancer and cancer related treatments Interventions: Interventions: Ensure Enlive (each supplement provides 350kcal and 20 grams of protein), MVI   Diet: Regular diet DVT Prophylaxis: Subcutaneous Lovenox   Advance goals of care discussion: DNR  Family Communication: family  was present at bedside, at the time of interview.  The pt provided permission to discuss medical plan with the family. Opportunity was given to ask question and all questions were answered satisfactorily.   Disposition:  Pt is from Home, admitted with breast cancer, metastasis to brain.  Awaiting  for family to come from [place], which precludes a safe discharge. Discharge to home with hospice services, when family will be here from [place] to take care of her and her kids. 6/2 as per [facility] patient's mother is coming from [place] on July 1 Follow TOC for disposition plan  Subjective: No significant events overnight, patient was lying comfortably, denies any specific complaints.  Physical Exam: General: NAD, lying comfortably Appear in no distress, affect appropriate Eyes: PERRLA ENT: Oral Mucosa Clear, moist  Neck: no JVD,  Cardiovascular: S1 and S2 Present, no Murmur,  Respiratory: good respiratory effort, Bilateral Air entry equal and Decreased, no Crackles, no wheezes Abdomen: Bowel Sound present, Soft and no tenderness,  Skin: no rashes Extremities: no Pedal edema, no calf tenderness Neurologic: without any new focal findings Gait not checked due to patient safety concerns  Vitals:   09/07/22 2035 09/08/22 0125 09/08/22 0539 09/08/22 0824  BP: (!) 137/91 (!) 153/84 121/74 128/75  Pulse: (!) 51 (!) 47 (!) 42 (!) 48  Resp: 16 18 18 15   Temp: 98.4 F (36.9 C) (!) 97.4 F (36.3 C) 98.3 F (36.8 C) 98.4 F (36.9 C)  TempSrc: Oral Oral Oral Oral  SpO2: 99% 100% 99% 100%    Intake/Output Summary (Last 24 hours) at 09/08/2022 1334 Last data filed at 09/07/2022 2219 Gross per 24 hour  Intake 1149.78 ml  Output --  Net 1149.78 ml   There were no vitals filed for this visit.  Data Reviewed: I have personally reviewed and interpreted daily labs, tele strips, imagings as discussed above. I reviewed all nursing notes, pharmacy notes, vitals, pertinent old records I have discussed plan of care as described above with RN and patient/family.  CBC: No results for input(s): "WBC", "NEUTROABS", "HGB", "HCT", "MCV", "PLT" in the last 168 hours.  Basic Metabolic Panel: No results for input(s): "NA", "K", "CL", "CO2", "GLUCOSE", "BUN", "CREATININE", "CALCIUM", "MG", "PHOS" in the  last 168 hours.   Studies: No results found.  Scheduled Meds:  calcium-vitamin D  1 tablet Oral BID   Chlorhexidine Gluconate Cloth  6 each Topical Daily   dexamethasone  6 mg Oral Daily   DULoxetine  60 mg Oral Daily   enoxaparin (LOVENOX) injection  40 mg Subcutaneous QPM   feeding supplement  237 mL Oral BID BM   gabapentin  600 mg Oral TID   levETIRAcetam  500 mg Oral BID   loratadine  10 mg Oral Daily   morphine  30 mg Oral Q12H   multivitamin with minerals  1 tablet Oral Daily   pantoprazole  40 mg Oral Daily   sodium chloride flush  10-40 mL Intracatheter Q12H   Continuous Infusions:  sodium chloride 100 mL/hr at 09/07/22 1947   PRN Meds: acetaminophen **OR** acetaminophen, gadobutrol, HYDROmorphone (DILAUDID) injection, LORazepam, LORazepam, magnesium hydroxide, morphine injection, OLANZapine, ondansetron **OR** ondansetron (ZOFRAN) IV, oxyCODONE-acetaminophen, sodium chloride flush, traZODone  Time spent: 35 minutes  Author: Gillis Santa. MD [facility] 09/08/2022 1:34 PM  To reach On-call, see care teams to locate the attending and reach out to them via [email or web address]. If 7PM-7AM, please contact night-coverage If you still have difficulty reaching the attending provider, please page the [facility] (  Director on Call) for [facility] on [place] for assistance.

## 2022-09-08 NOTE — Progress Notes (Addendum)
[facility]- [employer] ([employer])  HL and TOC, Sophia Shoffner, LCSW, called the patient's mother in [place], yesterday.  Mother reported that she has a plane ticket to [place] for July 1st.  Mother is aware that she will be helping patient with her care needs.  HL gave the mother an overview of Hospice services in the home .  Patient is not IPU appropriate at this time.  HL will follow peripherally while waiting  for discharge from the hospital.  Patient/family requesting a hospital bed when patient returns home.    Please don't hesitate to call with any Hospice related questions or concerns.    Thank you for the opportunity to participate in this patient's care. [facility] Liaison [phone]

## 2022-09-08 NOTE — TOC Progression Note (Signed)
Transition of Care [facility]) - Progression Note    Patient Details  Name: Sophia [name] MRN: [id number] Date of Birth: May 21, 1980  Transition of [facility]) CM/SW Contact  Garret Reddish, RN Phone Number: 09/08/2022, 1:17 PM  Clinical Narrative:   I have received call from CPS Supervisor Newman Nickels.  She informs me that CPS worker Scharlene Gloss will be coming to visit patient at the hospital today. Mrs. Aggie Hacker reports that as long as the patient is able to have a plan in place for her care at home and a plan in place for her children patient is able to go home with support of Hospice and her support of her mother.  Mrs. Aggie Hacker also reports that CPS worker will also work with patient and her family to provide additional paperwork for Mrs. Ginnie Smart mother to assist with education and medical concerns for her children when Mrs. [name] dies.  Mrs. Aggie Hacker informs me that the children will not be taken into custody as long as their is a safe plan in place for the children and they are not in immediate danger.    Noted safe plan in place for children at this time.  Mrs. Ginnie Smart mother will be flying from [place] to help care for Mrs. Ginnie Smart and her children.   I have informed staff nurse and provider of the above information.           Expected Discharge Plan and Services                                               Social Determinants of Health (SDOH) Interventions SDOH Screenings   Food Insecurity: No Food Insecurity (08/31/2022)  Recent Concern: Food Insecurity - Food Insecurity Present (06/23/2022)  Housing: Low Risk  (08/31/2022)  Recent Concern: Housing - High Risk (06/23/2022)  Transportation Needs: No Transportation Needs (08/31/2022)  Recent Concern: Transportation Needs - Unmet Transportation Needs (07/21/2022)  Utilities: Not At Risk (08/31/2022)  Alcohol Screen: Low Risk  (11/18/2021)  Depression (PHQ2-9): Medium Risk (06/23/2022)  Financial Resource Strain: High  Risk (11/18/2021)  Physical Activity: Inactive (11/18/2021)  Social Connections: Socially Isolated (06/23/2022)  Stress: Stress Concern Present (11/18/2021)  Tobacco Use: Low Risk  (08/31/2022)    Readmission Risk Interventions     No data to display

## 2022-09-08 NOTE — Progress Notes (Signed)
       CROSS COVER NOTE  NAME: Sophia [name] MRN: [id number] DOB : 1981-01-08    Concern as stated by nurse / staff   Patient admitted with acute metabolic encephalopathy. 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 Patient's HR is ranging from 30s-40s while at rest. Sustained for a couple of seconds in the low 30s. HR is mainly sustaining in the mid 40s. Patient is asymptomatic with the bradycardia. Notifying you per the order to notify if HR <40.    11 mins CK 2 leads were found off. I replaced them. HR sustaining in low to mid 40s while at rest     Pertinent findings on chart review: Progress notes reviewed: Patient with borderline sinus bradycardia noted on 6/14.  She is currently DNR and awaiting home with hospice Heart rate averaging in the high 40s to low 50s throughout the day on 6/20   Assessment and  Interventions   Assessment: Asymptomatic sinus bradycardia in patient with breast cancer metastatic to brain awaiting discharge to hospice.  Not on any AV nodal blockers  Plan: Continue current management Primary team to consider discontinuation of continuous cardiac monitoring given plan for discharge to hospice X

## 2022-09-08 NOTE — Plan of Care (Signed)

## 2022-09-09 LAB — COMPREHENSIVE METABOLIC PANEL
ALT: 26 U/L (ref 0–44)
AST: 31 U/L (ref 15–41)
Albumin: 2.8 g/dL — ABNORMAL LOW (ref 3.5–5.0)
Alkaline Phosphatase: 54 U/L (ref 38–126)
Anion gap: 7 (ref 5–15)
BUN: <5 mg/dL — ABNORMAL LOW (ref 6–20)
CO2: 24 mmol/L (ref 22–32)
Calcium: 8.4 mg/dL — ABNORMAL LOW (ref 8.9–10.3)
Chloride: 108 mmol/L (ref 98–111)
Creatinine, Ser: 0.54 mg/dL (ref 0.44–1.00)
GFR, Estimated: >60 mL/min (ref >60)
Glucose, Bld: 117 mg/dL — ABNORMAL HIGH (ref 70–99)
Potassium: 2.9 mmol/L — ABNORMAL LOW (ref 3.5–5.1)
Sodium: 139 mmol/L (ref 135–145)
Total Bilirubin: 0.3 mg/dL (ref 0.3–1.2)
Total Protein: 5.2 g/dL — ABNORMAL LOW (ref 6.5–8.1)

## 2022-09-09 LAB — GLUCOSE, CAPILLARY
Glucose-Capillary: 109 mg/dL — ABNORMAL HIGH (ref 70–99)
Glucose-Capillary: 75 mg/dL (ref 70–99)

## 2022-09-09 LAB — MAGNESIUM: Magnesium: 2.2 mg/dL (ref 1.7–2.4)

## 2022-09-09 LAB — PHOSPHORUS: Phosphorus: 3.7 mg/dL (ref 2.5–4.6)

## 2022-09-09 MED ORDER — POTASSIUM CHLORIDE CRYS ER 20 MEQ PO TBCR
40.0000 meq | EXTENDED_RELEASE_TABLET | Freq: Once | ORAL | Status: AC
  Administered 2022-09-09: 40 meq via ORAL
  Filled 2022-09-09: qty 2

## 2022-09-09 MED ORDER — POTASSIUM CHLORIDE 10 MEQ/100ML IV SOLN
10.0000 meq | INTRAVENOUS | Status: AC
  Administered 2022-09-09 (×4): 10 meq via INTRAVENOUS
  Filled 2022-09-09: qty 100

## 2022-09-09 MED ORDER — LACTATED RINGERS IV SOLN: INTRAVENOUS | Status: DC

## 2022-09-09 NOTE — Progress Notes (Signed)
[facility]) Hospice hospital liaison note  Patient is referred for hospice services at home upon discharge.   Liaison continues to follow for discharge disposition.   Thea Gist, BSN, RN Hospice hospital liaison note [phone]

## 2022-09-09 NOTE — Progress Notes (Signed)
       CROSS COVER NOTE  NAME: Sophia [name] MRN: [id number] DOB : [birth year]    Concern as stated by nurse / staff   "Patient admitted for Aphasia, has breast ca with brain mets. Aphasia resolved. She has a low BP 82/63 MAP 69 and recheck 77/56 MAP 63. She has IV fluids ordered but was not infusion when I came in maybe due to her port will not flush, but IV team in room now to assess port. "     Pertinent findings on chart review: H & P progress  and most recent labs 6/13 reviewed   Assessment and  Interventions   Assessment:  Latest Reference Range & Units 09/09/22 00:25  COMPREHENSIVE METABOLIC PANEL  Rpt !  Sodium 135 - 145 mmol/L 139  Potassium 3.5 - 5.1 mmol/L 2.9 (L)  Chloride 98 - 111 mmol/L 108  CO2 22 - 32 mmol/L 24  Glucose 70 - 99 mg/dL 409 (H)  BUN 6 - 20 mg/dL <5 (L)  Creatinine 8.11 - 1.00 mg/dL 9.14  Calcium 8.9 - 78.2 mg/dL 8.4 (L)  Anion gap 5 - 15  7  Phosphorus 2.5 - 4.6 mg/dL 3.7  Magnesium 1.7 - 2.4 mg/dL 2.2  Alkaline Phosphatase 38 - 126 U/L 54  Albumin 3.5 - 5.0 g/dL 2.8 (L)  AST 15 - 41 U/L 31  ALT 0 - 44 U/L 26  Total Protein 6.5 - 8.1 g/dL 5.2 (L)  Total Bilirubin 0.3 - 1.2 mg/dL 0.3  GFR, Estimated >95 mL/min >60  !: Data is abnormal (L): Data is abnormally low (H): Data is abnormally high Rpt: View report in Results Review for more information Plan: Potassium 10 mEQ x4; 40 oral Change IVF to LR at 100       Donnie Mesa NP [facility] Cross Cover 7pm-7am - check amion for availability Pager [phone]

## 2022-09-09 NOTE — Progress Notes (Addendum)
   09/08/22 2027  Assess: MEWS Score  Temp 98.2 F (36.8 C)  BP (!) 77/56  MAP (mmHg) (!) 63  Pulse Rate 79  Resp 18  SpO2 100 %  Assess: MEWS Score  MEWS Temp 0  MEWS Systolic 2  MEWS Pulse 0  MEWS RR 0  MEWS LOC 0  MEWS Score 2  MEWS Score Color Yellow  Assess: if the MEWS score is Yellow or Red  Were vital signs taken at a resting state? Yes  Focused Assessment No change from prior assessment  Does the patient meet 2 or more of the SIRS criteria? No  MEWS guidelines implemented  Yes, yellow  Treat  MEWS Interventions Considered administering scheduled or prn medications/treatments as ordered  Take Vital Signs  Increase Vital Sign Frequency  Yellow: Q2hr x1, continue Q4hrs until patient remains green for 12hrs  Escalate  MEWS: Escalate Yellow: Discuss with charge nurse and consider notifying provider and/or RRT  Notify: Charge Nurse/RN  Name of Charge Nurse/RN Notified Elveria Rising  Provider Notification  Provider Name/Title [facility]  Date Provider Notified 09/08/22  Time Provider Notified 2027  Method of Notification Edwardo Wojnarowski  Notification Reason Other (Comment) (Low BP)  Provider response See new orders  Date of Provider Response 09/08/22  Time of Provider Response 2028  Assess: SIRS CRITERIA  SIRS Temperature  0  SIRS Pulse 0  SIRS Respirations  0  SIRS WBC 0  SIRS Score Sum  0   Patient alert and oriented, no complaints of pain, became hypotensive this shift, Hospitalist notified, IV fluids changed to LR at 170ml/hr, labs ordered and four runs of  IV potassium ordered and received, will continue to monitor.

## 2022-09-09 NOTE — Progress Notes (Signed)
[facility] Progress Note  Patient: Sophia [name]    NFA:[id number]  DOA: 08/30/2022     Date of Service: the patient was seen and examined on 09/09/2022  Chief Complaint  Patient presents with   Altered Mental Status   Brief hospital course: Sophia [name] is a 42 y.o. 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     Patient is from [place] and lives here with her 3 kids ages between 38-11.  No family support.   Data reviewed independently and ED Course: pt was found to have WBC 2.9, GFR> 60, temperature normal, blood pressure 112/73, heart rate of 83, RR 18, oxygen saturation 100% on room air. CTA of head and neck negative for LVO.    MRI-brain w/ and w/o contrast: 1. Mixed response to therapy since April: -11 enhancing metastases in both cerebral and cerebellar hemispheres are all slightly smaller. And cerebellar edema has resolved. 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 - And there are one, possibly two subcentimeter NEW left hemisphere white matter metastases (series 18, images 79 and 94).   2. Ongoing extensive T2 and FLAIR hyperintensity most resembling tumoral edema in the left hemisphere, progressed in the superior left frontal and parietal lobes, and now associated with 3 mm of rightward midline shift.   3. Negative for acute infarct.   CT-head: Extensive vasogenic edema as seen on prior study and compatible with the known metastatic disease present on prior imaging. This appears similar to prior study.   EKG: Sinus rhythm, QTc 500, QT wave in lead III, low voltage, early R wave progression.   Oncology and palliative care from cancer center  is on board.   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 If does not show good response in the upcoming days then she will need inpatient hospice due to lack of supportive care.  Assessment and Plan:  Aphasia, resolved  Acute metabolic encephalopathy.  Resolved  Likely secondary to metastatic disease to brain.  Patient had a small midline shift and concern of some vasogenic edema. Her oncologist was consulted and according to  [name], overall prognosis is poor.  There is no role for surgery or radiation.  S/p IV Decadron and Keppra.  Continue Decadron 6 mg p.o. daily and Keppra 500 mg p.o. twice daily Seems to have some improvement and currently stable. Palliative care from cancer center is on board. Patient has 3 young kids with no family or community support locally.   Breast cancer metastasized to brain [facility]) S/p whole brain radiation. S/p chemotherapy, which was canceled. -They will resume chemo if functional status improves, otherwise she will be a candidate for hospice facility   Neoplasm related pain -Continue MS Contin 30 mg twice daily -As needed as needed Dilaudid and Percocet   GERD (gastroesophageal reflux disease) -Continue Protonix   Depression with anxiety -Continue home meds  There is no height or weight on file to calculate BMI.  Nutrition Problem: Increased nutrient needs Etiology: cancer and cancer related treatments Interventions: Interventions: Ensure Enlive (each supplement provides 350kcal and 20 grams of protein), MVI   Diet: Regular diet DVT Prophylaxis: Subcutaneous Lovenox   Advance goals of care discussion: DNR  Family Communication: family was present at bedside, at the time of interview.  The pt provided permission to discuss medical plan with the family. Opportunity was given to ask question and all questions were answered satisfactorily.   Disposition:  Pt is from Home, admitted with breast cancer, metastasis  to brain.  Awaiting for family to come from [place], which precludes a safe discharge. Discharge to home with hospice services, when family will be here from [place] to take care of her and her kids. 6/2 as per [facility] patient's mother is coming from [place] in the first week of July Follow TOC for disposition plan  Subjective: No significant events overnight, patient was lying comfortably, denies any specific complaints.  Physical Exam: General: NAD, lying comfortably Appear in no distress, affect appropriate Eyes: PERRLA ENT: Oral Mucosa Clear, moist  Neck: no JVD,  Cardiovascular: S1 and S2 Present, no Murmur,  Respiratory: good respiratory effort, Bilateral Air entry equal and Decreased, no Crackles, no wheezes Abdomen: Bowel Sound present, Soft and no tenderness,  Skin: no rashes Extremities: no Pedal edema, no calf tenderness Neurologic: without any new focal findings Gait not checked due to patient safety concerns  Vitals:   09/09/22 0327 09/09/22 0328 09/09/22 0823 09/09/22 1151  BP: (!) 85/69 (!) 86/59 104/63 (!) 91/59  Pulse: 61 (!) 59 (!) 50 70  Resp: 16  16 16   Temp: 98.6 F (37 C)  98.3 F (36.8 C) 97.6 F (36.4 C)  TempSrc: Oral   Oral  SpO2: 99% 99% 97% 98%    Intake/Output Summary (Last 24 hours) at 09/09/2022 1506 Last data filed at 09/09/2022 1447 Gross per 24 hour  Intake 3125.87 ml  Output --  Net 3125.87 ml   There were no vitals filed for this visit.  Data Reviewed: I have personally reviewed and interpreted daily labs, tele strips, imagings as discussed above. I reviewed all nursing notes, pharmacy notes, vitals, pertinent old records I have discussed plan of care as described above with RN and patient/family.  CBC: No results for input(s): "WBC", "NEUTROABS", "HGB", "HCT", "MCV", "PLT" in the last 168 hours.  Basic Metabolic Panel: Recent Labs  Lab 09/09/22 0025  NA 139  K 2.9*  CL 108  CO2 24  GLUCOSE 117*  BUN <5*  CREATININE 0.54   CALCIUM 8.4*  MG 2.2  PHOS 3.7     Studies: No results found.  Scheduled Meds:  calcium-vitamin D  1 tablet Oral BID   Chlorhexidine Gluconate Cloth  6 each Topical Daily   dexamethasone  6 mg Oral Daily   DULoxetine  60 mg Oral Daily   enoxaparin (LOVENOX) injection  40 mg Subcutaneous QPM   feeding supplement  237 mL Oral BID BM   gabapentin  600 mg Oral TID   levETIRAcetam  500 mg Oral BID   loratadine  10 mg Oral Daily   morphine  30 mg Oral Q12H   multivitamin with minerals  1 tablet Oral Daily   pantoprazole  40 mg Oral Daily   sodium chloride flush  10-40 mL Intracatheter Q12H   Continuous Infusions:  lactated ringers 100 mL/hr at 09/09/22 0426   PRN Meds: acetaminophen **OR** acetaminophen, gadobutrol, HYDROmorphone (DILAUDID) injection, LORazepam, LORazepam, magnesium hydroxide, morphine injection, OLANZapine, ondansetron **OR** ondansetron (ZOFRAN) IV, oxyCODONE-acetaminophen, sodium chloride flush, traZODone  Time spent: 35 minutes  Author: Gillis Santa. MD [facility] 09/09/2022 3:06 PM  To reach On-call, see care teams to locate the attending and reach out to them  via [email or web address]. If 7PM-7AM, please contact night-coverage If you still have difficulty reaching the attending provider, please page the [facility] (Director on Call) for [facility] on amion for assistance.

## 2022-09-10 LAB — GLUCOSE, CAPILLARY: Glucose-Capillary: 95 mg/dL (ref 70–99)

## 2022-09-10 MED ORDER — POTASSIUM CHLORIDE 10 MEQ/100ML IV SOLN
10.0000 meq | INTRAVENOUS | Status: AC
  Administered 2022-09-10 (×4): 10 meq via INTRAVENOUS
  Filled 2022-09-10 (×2): qty 100

## 2022-09-10 MED ORDER — POTASSIUM CHLORIDE CRYS ER 20 MEQ PO TBCR
40.0000 meq | EXTENDED_RELEASE_TABLET | ORAL | Status: AC
  Administered 2022-09-10 (×2): 40 meq via ORAL
  Filled 2022-09-10 (×2): qty 2

## 2022-09-10 NOTE — Progress Notes (Signed)
[facility] Progress Note  Patient: Sophia [name]    VWU:[id number]  DOA: 08/30/2022     Date of Service: the patient was seen and examined on 09/10/2022  Chief Complaint  Patient presents with   Altered Mental Status   Brief hospital course: Sophia [name] is a 42 y.o. 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     Patient is from [place] and lives here with her 3 kids ages between 34-11.  No family support.   Data reviewed independently and ED Course: pt was found to have WBC 2.9, GFR> 60, temperature normal, blood pressure 112/73, heart rate of 83, RR 18, oxygen saturation 100% on room air. CTA of head and neck negative for LVO.    MRI-brain w/ and w/o contrast: 1. Mixed response to therapy since April: -11 enhancing metastases in both cerebral and cerebellar hemispheres are all slightly smaller. And cerebellar edema has resolved. 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 - And there are one, possibly two subcentimeter NEW left hemisphere white matter metastases (series 18, images 79 and 94).   2. Ongoing extensive T2 and FLAIR hyperintensity most resembling tumoral edema in the left hemisphere, progressed in the superior left frontal and parietal lobes, and now associated with 3 mm of rightward midline shift.   3. Negative for acute infarct.   CT-head: Extensive vasogenic edema as seen on prior study and compatible with the known metastatic disease present on prior imaging. This appears similar to prior study.   EKG: Sinus rhythm, QTc 500, QT wave in lead III, low voltage, early R wave progression.   Oncology and palliative care from cancer center  is on board.   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 If does not show good response in the upcoming days then she will need inpatient hospice due to lack of supportive care.  Assessment and Plan:  Aphasia, resolved  Acute metabolic encephalopathy.  Resolved  Likely secondary to metastatic disease to brain.  Patient had a small midline shift and concern of some vasogenic edema. Her oncologist was consulted and according to  [name], overall prognosis is poor.  There is no role for surgery or radiation.  S/p IV Decadron and Keppra.  Continue Decadron 6 mg p.o. daily and Keppra 500 mg p.o. twice daily Seems to have some improvement and currently stable. Palliative care from cancer center is on board. Patient has 3 young kids with no family or community support locally.   Breast cancer metastasized to brain [facility]) S/p whole brain radiation. S/p chemotherapy, which was canceled. -They will resume chemo if functional status improves, otherwise she will be a candidate for hospice facility   Neoplasm related pain -Continue MS Contin 30 mg twice daily -As needed as needed Dilaudid and Percocet   Hypotension developed on 6/22 early morning IV fluid bolus was given, continue gentle hydration Monitor BP  Hypokalemia, potassium repleted. Monitor electrolytes   GERD (gastroesophageal reflux disease) -Continue Protonix   Depression with anxiety -Continue home meds  There is no height or weight on file to calculate BMI.  Nutrition Problem: Increased nutrient needs Etiology: cancer and cancer related treatments Interventions: Interventions: Ensure Enlive (each supplement provides  350kcal and 20 grams of protein), MVI   Diet: Regular diet DVT Prophylaxis: Subcutaneous Lovenox   Advance goals of care discussion: DNR  Family Communication: family was present at bedside, at the time of interview.  The pt provided permission to discuss medical plan with  the family. Opportunity was given to ask question and all questions were answered satisfactorily.   Disposition:  Pt is from Home, admitted with breast cancer, metastasis to brain.  Awaiting for family to come from [place], which precludes a safe discharge. Discharge to home with hospice services, when family will be here from [place] to take care of her and her kids. 6/2 as per [facility] patient's mother is coming from [place] in the first week of July Follow TOC for disposition plan  Subjective: No significant events overnight, patient was lying comfortably, denies any specific complaints.  Physical Exam: General: NAD, lying comfortably Appear in no distress, affect appropriate Eyes: PERRLA ENT: Oral Mucosa Clear, moist  Neck: no JVD,  Cardiovascular: S1 and S2 Present, no Murmur,  Respiratory: good respiratory effort, Bilateral Air entry equal and Decreased, no Crackles, no wheezes Abdomen: Bowel Sound present, Soft and no tenderness,  Skin: no rashes Extremities: no Pedal edema, no calf tenderness Neurologic: without any new focal findings Gait not checked due to patient safety concerns  Vitals:   09/10/22 0015 09/10/22 0549 09/10/22 0827 09/10/22 1218  BP: 99/65 109/60 134/88 (!) 99/54  Pulse: 65 (!) 57 (!) 48 72  Resp: 16 16 18 18   Temp: 98.4 F (36.9 C) 98.3 F (36.8 C) 98.6 F (37 C) 98.5 F (36.9 C)  TempSrc: Oral Oral Oral   SpO2: 99% 99% 98% 99%    Intake/Output Summary (Last 24 hours) at 09/10/2022 1309 Last data filed at 09/09/2022 1447 Gross per 24 hour  Intake 120 ml  Output --  Net 120 ml   There were no vitals filed for this visit.  Data Reviewed: I have personally reviewed and interpreted daily labs, tele strips, imagings as discussed above. I reviewed all nursing notes, pharmacy notes, vitals, pertinent old records I have discussed plan of care as described above with RN and patient/family.  CBC: No results for input(s): "WBC", "NEUTROABS", "HGB", "HCT",  "MCV", "PLT" in the last 168 hours.  Basic Metabolic Panel: Recent Labs  Lab 09/09/22 0025  NA 139  K 2.9*  CL 108  CO2 24  GLUCOSE 117*  BUN <5*  CREATININE 0.54  CALCIUM 8.4*  MG 2.2  PHOS 3.7     Studies: No results found.  Scheduled Meds:  calcium-vitamin D  1 tablet Oral BID   Chlorhexidine Gluconate Cloth  6 each Topical Daily   dexamethasone  6 mg Oral Daily   DULoxetine  60 mg Oral Daily   enoxaparin (LOVENOX) injection  40 mg Subcutaneous QPM   feeding supplement  237 mL Oral BID BM   gabapentin  600 mg Oral TID   levETIRAcetam  500 mg Oral BID   loratadine  10 mg Oral Daily   morphine  30 mg Oral Q12H   multivitamin with minerals  1 tablet Oral Daily   pantoprazole  40 mg Oral Daily   potassium chloride  40 mEq Oral Q4H   sodium chloride flush  10-40 mL Intracatheter Q12H   Continuous Infusions:  lactated ringers 75 mL/hr at 09/10/22 0907   potassium chloride 10 mEq (09/10/22 1248)   PRN Meds: acetaminophen **OR** acetaminophen, gadobutrol, HYDROmorphone (DILAUDID) injection, LORazepam, LORazepam, magnesium hydroxide, morphine  injection, OLANZapine, ondansetron **OR** ondansetron (ZOFRAN) IV, oxyCODONE-acetaminophen, sodium chloride flush, traZODone  Time spent: 35 minutes  Author: Gillis Santa. MD [facility] 09/10/2022 1:09 PM  To reach On-call, see care teams to locate the attending and reach out to them via [email or web address]. If 7PM-7AM, please contact night-coverage If you still have difficulty reaching the attending provider, please page the [facility] (Director on Call) for [facility] on amion for assistance.

## 2022-09-10 NOTE — Progress Notes (Signed)
[facility]) Hospice hospital liaison note   Patient is referred for hospice services at home upon discharge. Noted current plan for patient's mother to arrive around first week of July.   Liaison continues to follow for discharge disposition.    Doreatha Martin, RN, BSN [facility] Liaison [phone]

## 2022-09-11 LAB — BASIC METABOLIC PANEL
Anion gap: 9 (ref 5–15)
BUN: 7 mg/dL (ref 6–20)
CO2: 22 mmol/L (ref 22–32)
Calcium: 8.7 mg/dL — ABNORMAL LOW (ref 8.9–10.3)
Chloride: 108 mmol/L (ref 98–111)
Creatinine, Ser: 0.51 mg/dL (ref 0.44–1.00)
GFR, Estimated: >60 mL/min (ref >60)
Glucose, Bld: 111 mg/dL — ABNORMAL HIGH (ref 70–99)
Potassium: 4.1 mmol/L (ref 3.5–5.1)
Sodium: 139 mmol/L (ref 135–145)

## 2022-09-11 LAB — GLUCOSE, CAPILLARY: Glucose-Capillary: 83 mg/dL (ref 70–99)

## 2022-09-11 LAB — CBC
HCT: 35 % — ABNORMAL LOW (ref 36.0–46.0)
Hemoglobin: 11.2 g/dL — ABNORMAL LOW (ref 12.0–15.0)
MCH: 27.9 pg (ref 26.0–34.0)
MCHC: 32 g/dL (ref 30.0–36.0)
MCV: 87.1 fL (ref 80.0–100.0)
Platelets: 299 10*3/uL (ref 150–400)
RBC: 4.02 MIL/uL (ref 3.87–5.11)
RDW: 17.9 % — ABNORMAL HIGH (ref 11.5–15.5)
WBC: 15.7 10*3/uL — ABNORMAL HIGH (ref 4.0–10.5)
nRBC: 0 % (ref 0.0–0.2)

## 2022-09-11 LAB — MAGNESIUM: Magnesium: 2.2 mg/dL (ref 1.7–2.4)

## 2022-09-11 LAB — PHOSPHORUS: Phosphorus: 3.8 mg/dL (ref 2.5–4.6)

## 2022-09-11 NOTE — Progress Notes (Signed)
[facility] Progress Note  Patient: Sophia [name]    ZOX:[id number]  DOA: 08/30/2022     Date of Service: the patient was seen and examined on 09/11/2022  Chief Complaint  Patient presents with   Altered Mental Status   Brief hospital course: Sophia [name] is a 42 y.o. 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     Patient is from [place] and lives here with her 3 kids ages between 34-11.  No family support.   Data reviewed independently and ED Course: pt was found to have WBC 2.9, GFR> 60, temperature normal, blood pressure 112/73, heart rate of 83, RR 18, oxygen saturation 100% on room air. CTA of head and neck negative for LVO.    MRI-brain w/ and w/o contrast: 1. Mixed response to therapy since April: -11 enhancing metastases in both cerebral and cerebellar hemispheres are all slightly smaller. And cerebellar edema has resolved. 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 - And there are one, possibly two subcentimeter NEW left hemisphere white matter metastases (series 18, images 79 and 94).   2. Ongoing extensive T2 and FLAIR hyperintensity most resembling tumoral edema in the left hemisphere, progressed in the superior left frontal and parietal lobes, and now associated with 3 mm of rightward midline shift.   3. Negative for acute infarct.   CT-head: Extensive vasogenic edema as seen on prior study and compatible with the known metastatic disease present on prior imaging. This appears similar to prior study.   EKG: Sinus rhythm, QTc 500, QT wave in lead III, low voltage, early R wave progression.   Oncology and palliative care from cancer center  is on board.   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 If does not show good response in the upcoming days then she will need inpatient hospice due to lack of supportive care.  Assessment and Plan:  Aphasia, resolved  Acute metabolic encephalopathy.  Resolved  Likely secondary to metastatic disease to brain.  Patient had a small midline shift and concern of some vasogenic edema. Her oncologist was consulted and according to  [name], overall prognosis is poor.  There is no role for surgery or radiation.  S/p IV Decadron and Keppra.  Continue Decadron 6 mg p.o. daily and Keppra 500 mg p.o. twice daily Seems to have some improvement and currently stable. Palliative care from cancer center is on board. Patient has 3 young kids with no family or community support locally.   Breast cancer metastasized to brain [facility]) S/p whole brain radiation. S/p chemotherapy, which was canceled. -They will resume chemo if functional status improves, otherwise she will be a candidate for hospice facility   Neoplasm related pain -Continue MS Contin 30 mg twice daily -As needed as needed Dilaudid and Percocet   Hypotension developed on 6/22 early morning IV fluid bolus was given, continue gentle hydration decreased LR 50 ml/hr Monitor BP  Hypokalemia, potassium repleted.  Resolved Monitor electrolytes   GERD (gastroesophageal reflux disease) -Continue Protonix   Depression with anxiety -Continue home meds  There is no height or weight on file to calculate BMI.  Nutrition Problem: Increased nutrient needs Etiology: cancer and cancer related treatments Interventions:  Interventions: Ensure Enlive (each supplement provides 350kcal and 20 grams of protein), MVI   Diet: Regular diet DVT Prophylaxis: Subcutaneous Lovenox   Advance goals of care discussion: DNR  Family Communication: family was present at bedside, at the time of interview.  The pt provided  permission to discuss medical plan with the family. Opportunity was given to ask question and all questions were answered satisfactorily.   Disposition:  Pt is from Home, admitted with breast cancer, metastasis to brain.  Awaiting for family to come from [place], which precludes a safe discharge. Discharge to home with hospice services, when family will be here from [place] to take care of her and her kids. 6/2 as per [facility] patient's mother is coming from [place] in the first week of July Follow TOC for disposition plan  Subjective: No significant events overnight, patient was lying comfortably, denies any specific complaints.  Physical Exam: General: NAD, lying comfortably Appear in no distress, affect appropriate Eyes: PERRLA ENT: Oral Mucosa Clear, moist  Neck: no JVD,  Cardiovascular: S1 and S2 Present, no Murmur,  Respiratory: good respiratory effort, Bilateral Air entry equal and Decreased, no Crackles, no wheezes Abdomen: Bowel Sound present, Soft and no tenderness,  Skin: no rashes Extremities: no Pedal edema, no calf tenderness Neurologic: without any new focal findings Gait not checked due to patient safety concerns  Vitals:   09/11/22 0729 09/11/22 0737 09/11/22 0752 09/11/22 1141  BP: 106/71 (!) 100/58 106/67 104/67  Pulse: (!) 50 (!) 49 (!) 51 75  Resp: 18 19  18   Temp: 98 F (36.7 C) 98.1 F (36.7 C)  98 F (36.7 C)  TempSrc:  Oral    SpO2: 100% 100%  100%    Intake/Output Summary (Last 24 hours) at 09/11/2022 1502 Last data filed at 09/11/2022 1420 Gross per 24 hour  Intake 4219.72 ml  Output --  Net 4219.72 ml   There were no vitals filed for this visit.  Data Reviewed: I have personally reviewed and interpreted daily labs, tele strips, imagings as discussed above. I reviewed all nursing notes, pharmacy notes, vitals, pertinent old records I have discussed plan of care as described above with RN and patient/family.  CBC: Recent Labs  Lab 09/11/22 0441   WBC 15.7*  HGB 11.2*  HCT 35.0*  MCV 87.1  PLT 299    Basic Metabolic Panel: Recent Labs  Lab 09/09/22 0025 09/11/22 0441  NA 139 139  K 2.9* 4.1  CL 108 108  CO2 24 22  GLUCOSE 117* 111*  BUN <5* 7  CREATININE 0.54 0.51  CALCIUM 8.4* 8.7*  MG 2.2 2.2  PHOS 3.7 3.8     Studies: No results found.  Scheduled Meds:  calcium-vitamin D  1 tablet Oral BID   Chlorhexidine Gluconate Cloth  6 each Topical Daily   dexamethasone  6 mg Oral Daily   DULoxetine  60 mg Oral Daily   enoxaparin (LOVENOX) injection  40 mg Subcutaneous QPM   feeding supplement  237 mL Oral BID BM   gabapentin  600 mg Oral TID   levETIRAcetam  500 mg Oral BID   loratadine  10 mg Oral Daily   morphine  30 mg Oral Q12H   multivitamin with minerals  1 tablet Oral Daily   pantoprazole  40 mg Oral Daily   sodium chloride flush  10-40 mL Intracatheter Q12H   Continuous Infusions:  lactated ringers 75 mL/hr at 09/11/22 0846   PRN Meds: acetaminophen **OR** acetaminophen, gadobutrol, HYDROmorphone (  DILAUDID) injection, LORazepam, LORazepam, magnesium hydroxide, morphine injection, OLANZapine, ondansetron **OR** ondansetron (ZOFRAN) IV, oxyCODONE-acetaminophen, sodium chloride flush, traZODone  Time spent: 35 minutes  Author: Gillis [name]. MD [facility] 09/11/2022 3:02 PM  To reach On-call, see care teams to locate the attending and reach out to them via [email or web address]. If 7PM-7AM, please contact night-coverage If you still have difficulty reaching the attending provider, please page the [facility] (Director on Call) for [facility] on amion for assistance.

## 2022-09-12 LAB — GLUCOSE, CAPILLARY
Glucose-Capillary: 107 mg/dL — ABNORMAL HIGH (ref 70–99)
Glucose-Capillary: 178 mg/dL — ABNORMAL HIGH (ref 70–99)

## 2022-09-12 MED ORDER — POLYVINYL ALCOHOL 1.4 % OP SOLN
1.0000 [drp] | OPHTHALMIC | Status: DC | PRN
  Administered 2022-09-12: 1 [drp] via OPHTHALMIC
  Filled 2022-09-12: qty 15

## 2022-09-12 NOTE — Progress Notes (Signed)
[facility] Progress Note  Patient: Sophia [name]    WJX:[id number]  DOA: 08/30/2022     Date of Service: the patient was seen and examined on 09/12/2022  Chief Complaint  Patient presents with   Altered Mental Status   Brief hospital course: Sophia [name] is a 42 y.o. 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     Patient is from [place] and lives here with her 3 kids ages between 26-11.  No family support.   Data reviewed independently and ED Course: pt was found to have WBC 2.9, GFR> 60, temperature normal, blood pressure 112/73, heart rate of 83, RR 18, oxygen saturation 100% on room air. CTA of head and neck negative for LVO.    MRI-brain w/ and w/o contrast: 1. Mixed response to therapy since April: -11 enhancing metastases in both cerebral and cerebellar hemispheres are all slightly smaller. And cerebellar edema has resolved. 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 - And there are one, possibly two subcentimeter NEW left hemisphere white matter metastases (series 18, images 79 and 94).  2. Ongoing extensive T2 and FLAIR hyperintensity most resembling tumoral edema in the left hemisphere, progressed in the superior left frontal and parietal lobes, and now associated with 3 mm of rightward midline shift. 3. Negative for acute infarct.   CT-head: Extensive vasogenic edema as seen on prior study and compatible with the known metastatic disease present on prior imaging. This appears similar to prior study.   EKG: Sinus rhythm, QTc 500, QT wave in lead III, low voltage, early R wave progression.   Oncology and palliative care from cancer center is on  board.   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 If does not show good response in the upcoming days then she will need inpatient hospice due to lack of supportive care.  Assessment and Plan:  Aphasia, resolved  Acute metabolic encephalopathy.  Resolved  Likely secondary to metastatic disease to brain.  Patient had a small midline shift and concern of some vasogenic edema. Her oncologist was consulted and according to  [name], overall prognosis is poor.  There is no role for surgery or radiation.  S/p IV Decadron and Keppra.  Continue Decadron 6 mg p.o. daily and Keppra 500 mg p.o. twice daily Seems to have some improvement and currently stable. Palliative care from cancer center is on board. Patient has 3 young kids with no family or community support locally.   Breast cancer metastasized to brain [facility]) S/p whole brain radiation. S/p chemotherapy, which was canceled. -They will resume chemo if functional status improves, otherwise she will be a candidate for hospice facility   Neoplasm related pain -Continue MS Contin 30 mg twice daily -As needed as needed Dilaudid and Percocet   Hypotension developed on 6/22 early morning IV fluid bolus was given, continue gentle hydration decreased LR 50 ml/hr Monitor BP  Hypokalemia, potassium repleted.  Resolved Monitor electrolytes   GERD (gastroesophageal reflux disease) -Continue Protonix   Depression with anxiety -Continue home meds  There is no height or weight on file to calculate BMI.  Nutrition Problem: Increased nutrient needs Etiology: cancer and cancer related treatments Interventions: Interventions: Ensure Enlive (  each supplement provides 350kcal and 20 grams of protein), MVI   Diet: Regular diet DVT Prophylaxis: Subcutaneous Lovenox   Advance goals of care discussion: DNR  Family Communication: family was present at bedside, at the time of interview.  The pt provided permission to  discuss medical plan with the family. Opportunity was given to ask question and all questions were answered satisfactorily.   Disposition:  Pt is from Home, admitted with breast cancer, metastasis to brain.  Awaiting for family to come from [place], which precludes a safe discharge. Discharge to home with hospice services, when family will be here from [place] to take care of her and her kids. 6/2 as per [facility] patient's mother is coming from [place] in the first week of July Follow [facility] for disposition plan 6/25 patient stated that she may have help with her friend, TOC is following to contact patient's friend who can help her in the meantime until her mother comes from [place].   Subjective: No significant events overnight, patient was lying comfortably, denies any specific complaints.  Physical Exam: General: NAD, lying comfortably Appear in no distress, affect appropriate Eyes: PERRLA ENT: Oral Mucosa Clear, moist  Neck: no JVD,  Cardiovascular: S1 and S2 Present, no Murmur,  Respiratory: good respiratory effort, Bilateral Air entry equal and Decreased, no Crackles, no wheezes Abdomen: Bowel Sound present, Soft and no tenderness,  Skin: no rashes Extremities: no Pedal edema, no calf tenderness Neurologic: without any new focal findings Gait not checked due to patient safety concerns  Vitals:   09/12/22 0006 09/12/22 0438 09/12/22 0854 09/12/22 1226  BP: 111/70 116/71 (!) 87/58 (!) 89/55  Pulse: 63 (!) 57 (!) 58 61  Resp: 19 18 18 18   Temp: 98.1 F (36.7 C) 98.3 F (36.8 C) 97.8 F (36.6 C) 98 F (36.7 C)  TempSrc:  Oral Oral Oral  SpO2: 99% 97% 97% 98%    Intake/Output Summary (Last 24 hours) at 09/12/2022 1501 Last data filed at 09/12/2022 1036 Gross per 24 hour  Intake 480 ml  Output --  Net 480 ml   There were no vitals filed for this visit.  Data Reviewed: I have personally reviewed and interpreted daily labs, tele strips, imagings as discussed above. I reviewed all  nursing notes, pharmacy notes, vitals, pertinent old records I have discussed plan of care as described above with RN and patient/family.  CBC: Recent Labs  Lab 09/11/22 0441  WBC 15.7*  HGB 11.2*  HCT 35.0*  MCV 87.1  PLT 299    Basic Metabolic Panel: Recent Labs  Lab 09/09/22 0025 09/11/22 0441  NA 139 139  K 2.9* 4.1  CL 108 108  CO2 24 22  GLUCOSE 117* 111*  BUN <5* 7  CREATININE 0.54 0.51  CALCIUM 8.4* 8.7*  MG 2.2 2.2  PHOS 3.7 3.8     Studies: No results found.  Scheduled Meds:  calcium-vitamin D  1 tablet Oral BID   Chlorhexidine Gluconate Cloth  6 each Topical Daily   dexamethasone  6 mg Oral Daily   DULoxetine  60 mg Oral Daily   enoxaparin (LOVENOX) injection  40 mg Subcutaneous QPM   feeding supplement  237 mL Oral BID BM   gabapentin  600 mg Oral TID   levETIRAcetam  500 mg Oral BID   loratadine  10 mg Oral Daily   morphine  30 mg Oral Q12H   multivitamin with minerals  1 tablet Oral Daily   pantoprazole  40 mg Oral  Daily   sodium chloride flush  10-40 mL Intracatheter Q12H   Continuous Infusions:  lactated ringers 50 mL/hr at 09/11/22 1613   PRN Meds: acetaminophen **OR** acetaminophen, gadobutrol, HYDROmorphone (DILAUDID) injection, LORazepam, LORazepam, magnesium hydroxide, morphine injection, OLANZapine, ondansetron **OR** ondansetron (ZOFRAN) IV, oxyCODONE-acetaminophen, polyvinyl alcohol, sodium chloride flush, traZODone  Time spent: 35 minutes  Author: Gillis Santa. MD [facility] 09/12/2022 3:01 PM  To reach On-call, see care teams to locate the attending and reach out to them via [email or web address]. If 7PM-7AM, please contact night-coverage If you still have difficulty reaching the attending provider, please page the [facility] (Director on Call) for [facility] on amion for assistance.

## 2022-09-12 NOTE — TOC Progression Note (Signed)
Transition of Care [facility]) - Progression Note    Patient Details  Name: Sophia [name] MRN: [id number] Date of Birth: 1981/03/02  Transition of [facility]) CM/SW Contact  Allena Katz, LCSW Phone Number: 09/12/2022, 4:26 PM  Clinical Narrative:   CSW spoke with patient utilizing interpreter with 3 children present. Pt reports she is going to stay with her friend emma at discharge. Ree Kida from Hospice spoke with Kara Mead who reports she is agreeable to this. Pt's son will be here tomorrow to pick her up at 1pm. CSW notified August Saucer [phone] with CPS of discharge. Pt reports her mother will not be in the [place] now until July 4th and she plans to move back home once she gets here.          Expected Discharge Plan and Services                                               Social Determinants of Health (SDOH) Interventions SDOH Screenings   Food Insecurity: No Food Insecurity (08/31/2022)  Recent Concern: Food Insecurity - Food Insecurity Present (06/23/2022)  Housing: Low Risk  (08/31/2022)  Recent Concern: Housing - High Risk (06/23/2022)  Transportation Needs: No Transportation Needs (08/31/2022)  Recent Concern: Transportation Needs - Unmet Transportation Needs (07/21/2022)  Utilities: Not At Risk (08/31/2022)  Alcohol Screen: Low Risk  (11/18/2021)  Depression (PHQ2-9): Medium Risk (06/23/2022)  Financial Resource Strain: High Risk (11/18/2021)  Physical Activity: Inactive (11/18/2021)  Social Connections: Socially Isolated (06/23/2022)  Stress: Stress Concern Present (11/18/2021)  Tobacco Use: Low Risk  (08/31/2022)    Readmission Risk Interventions     No data to display

## 2022-09-12 NOTE — Progress Notes (Signed)
[facility]) Hospice hospital liaison note   Patient is referred for hospice services at home upon discharge. Noted current plan for patient's mother to arrive around first week of July. Hospital bed to be delivered around the time of discharge.     Liaison continues to follow for discharge disposition.   [facility] Liaison [phone]

## 2022-09-12 NOTE — Plan of Care (Signed)
?  Problem: Health Behavior/Discharge Planning: ?Goal: Ability to manage health-related needs will improve ?Outcome: Progressing ?  ?Problem: Activity: ?Goal: Risk for activity intolerance will decrease ?Outcome: Progressing ?  ?Problem: Nutrition: ?Goal: Adequate nutrition will be maintained ?Outcome: Progressing ?  ?Problem: Coping: ?Goal: Level of anxiety will decrease ?Outcome: Progressing ?  ?Problem: Pain Managment: ?Goal: General experience of comfort will improve ?Outcome: Progressing ?  ?Problem: Safety: ?Goal: Ability to remain free from injury will improve ?Outcome: Progressing ?  ?Problem: Skin Integrity: ?Goal: Risk for impaired skin integrity will decrease ?Outcome: Progressing ?  ?

## 2022-09-12 NOTE — Progress Notes (Signed)
Nutrition Brief Note  Chart reviewed. Care plan unchanged. Per oncology, pt is not a candidate for surgery or further treatments. Plan for home with hospice once pt's mother arrives from [place] next week. If pt declines further, may require inpatient/ residential hospice. TOC following for family support.  No further nutrition interventions planned at this time.  Please re-consult as needed.   Levada Schilling, RD, LDN, CDCES Registered Dietitian II Certified Diabetes Care and Education Specialist Please refer to [facility] for RD and/or RD on-call/weekend/after hours pager

## 2022-09-12 NOTE — TOC Progression Note (Signed)
Transition of Care [facility]) - Progression Note    Patient Details  Name: Sophia [name] MRN: [id number] Date of Birth: 22-Feb-1981  Transition of Care [facility]) CM/SW Contact  Allena Katz, LCSW Phone Number: 09/12/2022, 1:08 PM  Clinical Narrative:     Per Pt, she may have additional help at home through a friend Kara Mead [phone]. CSW lvm for emma.       Expected Discharge Plan and Services                                               Social Determinants of Health (SDOH) Interventions SDOH Screenings   Food Insecurity: No Food Insecurity (08/31/2022)  Recent Concern: Food Insecurity - Food Insecurity Present (06/23/2022)  Housing: Low Risk  (08/31/2022)  Recent Concern: Housing - High Risk (06/23/2022)  Transportation Needs: No Transportation Needs (08/31/2022)  Recent Concern: Transportation Needs - Unmet Transportation Needs (07/21/2022)  Utilities: Not At Risk (08/31/2022)  Alcohol Screen: Low Risk  (11/18/2021)  Depression (PHQ2-9): Medium Risk (06/23/2022)  Financial Resource Strain: High Risk (11/18/2021)  Physical Activity: Inactive (11/18/2021)  Social Connections: Socially Isolated (06/23/2022)  Stress: Stress Concern Present (11/18/2021)  Tobacco Use: Low Risk  (08/31/2022)    Readmission Risk Interventions     No data to display

## 2022-09-13 ENCOUNTER — Encounter: Payer: Self-pay | Admitting: Oncology

## 2022-09-13 ENCOUNTER — Other Ambulatory Visit: Payer: Self-pay

## 2022-09-13 LAB — GLUCOSE, CAPILLARY: Glucose-Capillary: 86 mg/dL (ref 70–99)

## 2022-09-13 MED ORDER — OMEPRAZOLE 40 MG PO CPDR
40.0000 mg | DELAYED_RELEASE_CAPSULE | Freq: Every day | ORAL | 2 refills | Status: AC
  Filled 2022-09-13: qty 30, 30d supply, fill #0

## 2022-09-13 MED ORDER — LEVETIRACETAM 500 MG PO TABS
500.0000 mg | ORAL_TABLET | Freq: Two times a day (BID) | ORAL | 2 refills | Status: AC
  Filled 2022-09-13: qty 60, 30d supply, fill #0

## 2022-09-13 MED ORDER — DEXAMETHASONE 4 MG PO TABS
4.0000 mg | ORAL_TABLET | Freq: Every day | ORAL | 2 refills | Status: AC
  Filled 2022-09-13: qty 30, 30d supply, fill #0

## 2022-09-13 MED ORDER — DEXAMETHASONE 4 MG PO TABS: 4.0000 mg | ORAL_TABLET | Freq: Every day | ORAL | Status: DC

## 2022-09-13 MED ORDER — HEPARIN SOD (PORK) LOCK FLUSH 100 UNIT/ML IV SOLN
500.0000 [IU] | Freq: Once | INTRAVENOUS | Status: AC
  Administered 2022-09-13: 500 [IU] via INTRAVENOUS
  Filled 2022-09-13: qty 5

## 2022-09-13 NOTE — Progress Notes (Addendum)
Palliative Medicine [facility] at [facility] Telephone:([phone]) [phone] Fax:([phone]   Name: Sophia [name] Date: 09/13/2022 MRN: [id number]  DOB: [birth year]  Patient Care Team: Creig Hines, MD as PCP - General (Oncology) Jim Like, RN as Registered Nurse Scarlett Presto, RN (Inactive) as Registered Nurse Creig Hines, MD as Consulting Physician (Hematology and Oncology)    REASON FOR CONSULTATION: Sophia [name] is a 42 y.o. 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 MRI of the brain on 02/08/2021 revealed widespread metastatic disease to the brain with mild associated edema and small area of hemorrhage in the right frontal.  Patient is status post whole brain radiation.  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  She was referred to palliative care to help address goals and manage ongoing symptoms .   CODE STATUS: DNR  PAST MEDICAL HISTORY: Past Medical History:  Diagnosis Date   Anxiety    Breast cancer (HCC)    with mets   Cancer (HCC)    Colitis    COVID-19 in immunocompromised patient (HCC)    Family history of colon cancer    Vertigo     PAST SURGICAL HISTORY:  Past Surgical History:  Procedure Laterality Date   BREAST BIOPSY Left 08/14/2019   [place] bx of mass, coil marker, path pending   BREAST BIOPSY Left 08/14/2019   [place] bx of LN, hydromarker, path pending   BREAST BIOPSY Left 08/14/2019   affirm bx of calcs, x marker, path pending   ESOPHAGOGASTRODUODENOSCOPY (EGD) WITH PROPOFOL N/A 10/05/2019   Procedure: ESOPHAGOGASTRODUODENOSCOPY (EGD) WITH PROPOFOL;  Surgeon: Toney Reil, MD;  Location: [facility] ENDOSCOPY;  Service: Gastroenterology;  Laterality: N/A;   FLEXIBLE SIGMOIDOSCOPY N/A 10/05/2019   Procedure: FLEXIBLE SIGMOIDOSCOPY;  Surgeon: Toney Reil, MD;  Location[facility] ENDOSCOPY;  Service:  Gastroenterology;  Laterality: N/A;   INTRAMEDULLARY (IM) NAIL INTERTROCHANTERIC Right 09/01/2019   Procedure: INTRAMEDULLARY (IM) NAIL INTERTROCHANTRIC AND RADIOFREQUENCY ABLATION;  Surgeon: Kennedy Bucker, MD;  Location: [facility] ORS;  Service: Orthopedics;  Laterality: Right;   KYPHOPLASTY N/A 08/29/2019   Procedure: KYPHOPLASTY T6, L1,L4 ,  RADIOFREQUENCY ABLATION;  Surgeon: Kennedy Bucker, MD;  Location: [facility] ORS;  Service: Orthopedics;  Laterality: N/A;   KYPHOPLASTY Right 09/01/2019   Procedure: Right Sacral Radiofrequency Ablation and Cement Augmentation, Right sacrum and iliac crest;  Surgeon: Kennedy Bucker, MD;  Location: [facility] ORS;  Service: Orthopedics;  Laterality: Right;   PORTA CATH INSERTION N/A 08/28/2019   Procedure: PORTA CATH INSERTION;  Surgeon: Annice Needy, MD;  Location: [facility] INVASIVE CV LAB;  Service: Cardiovascular;  Laterality: N/A;    HEMATOLOGY/ONCOLOGY HISTORY:  Oncology History  Primary cancer of left breast with metastasis to other site [facility])  08/28/2019 Initial Diagnosis   Primary cancer of left breast with metastasis to other site [facility])   01/21/2021 Cancer Staging   Staging form: Breast, AJCC 8th Edition - Clinical stage from 01/21/2021: Stage IV (cT2, cN1, cM1, G2, ER-, PR-, HER2+) - Signed by Creig Hines, MD on 01/21/2021 Histologic grading system: 3 grade system   Primary malignant neoplasm of breast with metastasis (HCC)  08/29/2019 Initial Diagnosis   Metastatic breast cancer (HCC)   09/04/2019 - 10/31/2021 Chemotherapy   Patient is on Treatment Plan : BREAST Weekly Paclitaxel + Trastuzumab + Pertuzumab q21d      Genetic Testing   Negative genetic testing. No pathogenic variants identified  on the [facility] CancerNext-Expanded+RNA panel. The report date is 04/13/2020.   The CancerNext-Expanded + RNAinsight gene panel offered by [employer] and includes sequencing and rearrangement analysis for the following 77 genes: IP, ALK, APC*, ATM*, AXIN2, BAP1, BARD1, BLM,  BMPR1A, BRCA1*, BRCA2*, BRIP1*, CDC73, CDH1*,CDK4, CDKN1B, CDKN2A, CHEK2*, CTNNA1, DICER1, FANCC, FH, FLCN, GALNT12, KIF1B, LZTR1, MAX, MEN1, MET, MLH1*, MSH2*, MSH3, MSH6*, MUTYH*, NBN, NF1*, NF2, NTHL1, PALB2*, PHOX2B, PMS2*, POT1, PRKAR1A, PTCH1, PTEN*, RAD51C*, RAD51D*,RB1, RECQL, RET, SDHA, SDHAF2, SDHB, SDHC, SDHD, SMAD4, SMARCA4, SMARCB1, SMARCE1, STK11, SUFU, TMEM127, TP53*,TSC1, TSC2, VHL and XRCC2 (sequencing and deletion/duplication); EGFR, EGLN1, HOXB13, KIT, MITF, PDGFRA, POLD1 and POLE (sequencing only); EPCAM and GREM1 (deletion/duplication only). DNA and RNA analyses performed for * genes.   11/18/2021 - 11/18/2021 Chemotherapy   Patient is on Treatment Plan : BREAST Trastuzumab (8/6) IV D1 + Capecitabine +  Tucatinib q21d     11/18/2021 - 12/09/2021 Chemotherapy   Patient is on Treatment Plan : BREAST Trastuzumab (8/6) IV D1 + Capecitabine +  Tucatinib q21d     12/26/2021 - 06/23/2022 Chemotherapy   Patient is on Treatment Plan : BREAST Trastuzumab (8/6) IV D1 + Capecitabine +  Tucatinib q21d     07/21/2022 -  Chemotherapy   Patient is on Treatment Plan : BREAST METASTATIC Fam-Trastuzumab Deruxtecan-nxki (Enhertu) (5.4) q21d       ALLERGIES:  has No Known Allergies.  MEDICATIONS:  Current Facility-Administered Medications  Medication Dose Route Frequency Provider Last Rate Last Admin   acetaminophen (TYLENOL) tablet 650 mg  650 mg Oral Q6H PRN Mansy, Jan A, MD   650 mg at 09/04/22 2236   Or   acetaminophen (TYLENOL) suppository 650 mg  650 mg Rectal Q6H PRN Mansy, Vernetta Honey, MD       calcium-vitamin D (OSCAL WITH D) 500-5 MG-MCG per tablet 1 tablet  1 tablet Oral BID Mansy, Jan A, MD   1 tablet at 09/13/22 0940   Chlorhexidine Gluconate Cloth 2 % PADS 6 each  6 each Topical Daily Lorretta Harp, MD   6 each at 09/13/22 0945   [START ON 09/14/2022] dexamethasone (DECADRON) tablet 4 mg  4 mg Oral Daily Gillis Santa, MD       DULoxetine (CYMBALTA) DR capsule 60 mg  60 mg Oral Daily Mansy, Jan A,  MD   60 mg at 09/13/22 0939   enoxaparin (LOVENOX) injection 40 mg  40 mg Subcutaneous QPM Gillis Santa, MD   40 mg at 09/12/22 1707   feeding supplement (ENSURE ENLIVE / ENSURE PLUS) liquid 237 mL  237 mL Oral BID BM Lorretta Harp, MD   237 mL at 09/13/22 0944   gabapentin (NEURONTIN) capsule 600 mg  600 mg Oral TID Mansy, Jan A, MD   600 mg at 09/13/22 0941   gadobutrol (GADAVIST) 1 MMOL/ML injection 5 mL  5 mL Intravenous Once PRN Georga Hacking, MD       HYDROmorphone (DILAUDID) injection 1 mg  1 mg Intravenous Q4H PRN Lorretta Harp, MD   1 mg at 09/12/22 2146   lactated ringers infusion   Intravenous Continuous Gillis Santa, MD 50 mL/hr at 09/12/22 1849 New Bag at 09/12/22 1849   levETIRAcetam (KEPPRA) tablet 500 mg  500 mg Oral BID Arnetha Courser, MD   500 mg at 09/13/22 0941   loratadine (CLARITIN) tablet 10 mg  10 mg Oral Daily Mansy, Jan A, MD   10 mg at 09/13/22 0940   LORazepam (ATIVAN) injection 2 mg  2  mg Intravenous Q2H PRN Lorretta Harp, MD       LORazepam (ATIVAN) tablet 0.5 mg  0.5 mg Oral Q6H PRN Mansy, Jan A, MD       magnesium hydroxide (MILK OF MAGNESIA) suspension 30 mL  30 mL Oral Daily PRN Mansy, Jan A, MD   30 mL at 09/12/22 1019   morphine (MS CONTIN) 12 hr tablet 30 mg  30 mg Oral Q12H Mansy, Jan A, MD   30 mg at 09/13/22 0941   morphine (PF) 2 MG/ML injection 2 mg  2 mg Intravenous Q2H PRN Mansy, Jan A, MD   2 mg at 09/12/22 1707   multivitamin with minerals tablet 1 tablet  1 tablet Oral Daily Lorretta Harp, MD   1 tablet at 09/13/22 0941   OLANZapine (ZYPREXA) tablet 10 mg  10 mg Oral QHS PRN Mansy, Jan A, MD       ondansetron [facility]) tablet 4 mg  4 mg Oral Q6H PRN Mansy, Jan A, MD       Or   ondansetron [facility]) injection 4 mg  4 mg Intravenous Q6H PRN Mansy, Jan A, MD       oxyCODONE-acetaminophen (PERCOCET/ROXICET) 5-325 MG per tablet 1 tablet  1 tablet Oral Q4H PRN Lorretta Harp, MD   1 tablet at 09/11/22 1418   pantoprazole (PROTONIX) EC tablet 40 mg  40 mg Oral Daily  Mansy, Jan A, MD   40 mg at 09/13/22 9629   polyvinyl alcohol (LIQUIFILM TEARS) 1.4 % ophthalmic solution 1 drop  1 drop Both Eyes PRN Gillis Santa, MD   1 drop at 09/12/22 1018   sodium chloride flush (NS) 0.9 % injection 10-40 mL  10-40 mL Intracatheter Q12H Lorretta Harp, MD   30 mL at 09/13/22 0941   sodium chloride flush (NS) 0.9 % injection 10-40 mL  10-40 mL Intracatheter PRN Lorretta Harp, MD       traZODone (DESYREL) tablet 25 mg  25 mg Oral QHS PRN Mansy, Jan A, MD   25 mg at 09/12/22 2146   Current Outpatient Medications  Medication Sig Dispense Refill   Calcium Carb-Cholecalciferol 600-10 MG-MCG TABS Take 1 tablet by mouth 2 (two) times daily. 180 tablet 0   DULoxetine (CYMBALTA) 60 MG capsule Take 1 capsule (60 mg total) by mouth daily. 30 capsule 3   gabapentin (NEURONTIN) 600 MG tablet Take 1 tablet (600 mg total) by mouth 3 (three) times daily. 90 tablet 3   HYDROmorphone (DILAUDID) 2 MG tablet Take 0.5-1 tablets (1-2 mg total) by mouth every 4 (four) hours as needed for severe pain. 60 tablet 0   lidocaine-prilocaine (EMLA) cream Apply 1 Application topically as needed. Apply small amount to port site at least 1 hour prior to it being accessed, cover with plastic wrap 30 g 3   loratadine (CLARITIN) 10 MG tablet Take 1 tablet (10 mg total) by mouth daily. For seasonal allergies 60 tablet 0   LORazepam (ATIVAN) 0.5 MG tablet Take 1 tablet (0.5 mg total) by mouth every 6 (six) hours as needed for anxiety (and nausea). AS NEEDED FOR NAUSEA 60 tablet 0   morphine (MS CONTIN) 30 MG 12 hr tablet Take 1 tablet (30 mg total) by mouth every 12 (twelve) hours. 60 tablet 0   OLANZapine (ZYPREXA) 10 MG tablet Take 1 tablet (10 mg total) by mouth at bedtime as needed (nausea). 30 tablet 2   ondansetron (ZOFRAN) 8 MG tablet Take 1 tablet (8 mg total) by mouth every  8 (eight) hours as needed for nausea or vomiting. 45 tablet 2   [START ON 09/14/2022] dexamethasone (DECADRON) 4 MG tablet Take 1 tablet (4  mg total) by mouth daily. 30 tablet 2   levETIRAcetam (KEPPRA) 500 MG tablet Take 1 tablet (500 mg total) by mouth 2 (two) times daily. 60 tablet 2   omeprazole (PRILOSEC) 40 MG capsule Take 1 capsule (40 mg total) by mouth daily. 30 capsule 2   Facility-Administered Medications Ordered in Other Encounters  Medication Dose Route Frequency Provider Last Rate Last Admin   0.9 %  sodium chloride infusion   Intravenous Continuous Mauro Kaufmann, NP   Stopped at 06/02/20 1453   heparin lock flush 100 unit/mL  500 Units Intravenous Once Creig Hines, MD       heparin lock flush 100 unit/mL  500 Units Intravenous Once Creig Hines, MD       sodium chloride flush (NS) 0.9 % injection 10 mL  10 mL Intravenous Once Creig Hines, MD       sodium chloride flush (NS) 0.9 % injection 10 mL  10 mL Intravenous Once Creig Hines, MD        VITAL SIGNS: BP (!) 98/57 (BP Location: Right Arm)   Pulse (!) 55   Temp 98.1 F (36.7 C)   Resp 17   SpO2 96%  There were no vitals filed for this visit.  Estimated body mass index is 24.22 kg/m as calculated from the following:   Height as of 08/11/22: 4\' 11"  (1.499 m).   Weight as of 08/11/22: 119 lb 14.4 oz (54.4 kg).  LABS: CBC:    Component Value Date/Time   WBC 15.7 (H) 09/11/2022 0441   HGB 11.2 (L) 09/11/2022 0441   HGB 12.0 08/11/2022 0927   HCT 35.0 (L) 09/11/2022 0441   PLT 299 09/11/2022 0441   PLT 578 (H) 08/11/2022 0927   MCV 87.1 09/11/2022 0441   NEUTROABS 1.4 (L) 08/30/2022 2357   LYMPHSABS 1.0 08/30/2022 2357   MONOABS 0.4 08/30/2022 2357   EOSABS 0.1 08/30/2022 2357   BASOSABS 0.0 08/30/2022 2357   Comprehensive Metabolic Panel:    Component Value Date/Time   NA 139 09/11/2022 0441   K 4.1 09/11/2022 0441   CL 108 09/11/2022 0441   CO2 22 09/11/2022 0441   BUN 7 09/11/2022 0441   CREATININE 0.51 09/11/2022 0441   CREATININE 0.42 (L) 08/11/2022 0927   GLUCOSE 111 (H) 09/11/2022 0441   CALCIUM 8.7 (L) 09/11/2022 0441    AST 31 09/09/2022 0025   AST 24 08/11/2022 0927   ALT 26 09/09/2022 0025   ALT 15 08/11/2022 0927   ALKPHOS 54 09/09/2022 0025   BILITOT 0.3 09/09/2022 0025   BILITOT 0.3 08/11/2022 0927   PROT 5.2 (L) 09/09/2022 0025   ALBUMIN 2.8 (L) 09/09/2022 0025    RADIOGRAPHIC STUDIES: MR Brain W and Wo Contrast  Result Date: 08/31/2022 CLINICAL DATA:  42 year old female with metastatic breast cancer. Treated initially with whole brain radiation in late 2022, and repeat salvage whole brain XRT in October 2023. Vasogenic edema. Neurologic deficit. EXAM: MRI HEAD WITHOUT AND WITH CONTRAST TECHNIQUE: Multiplanar, multiecho pulse sequences of the brain and surrounding structures were obtained without and with intravenous contrast. CONTRAST:  5mL GADAVIST GADOBUTROL 1 MMOL/ML IV SOLN COMPARISON:  CTA head and neck 0041 hours today. Most recent restaging brain MRI 07/04/2022. FINDINGS: Brain: No restricted diffusion or evidence of acute infarction. Four cerebellar enhancing brain  metastases are all slightly smaller since 07/04/2022, ranging from punctate (right cerebellum series 18, image 21) up to 13 mm (left posterior cerebellum series 18, image 18). All lesions annotated on series 18. And cerebellar edema has resolved since 07/04/2022. Irregular, stellate and rim enhancing lesion at the left frontal lobe anterior operculum has enlarged since April, now 25 by 24 mm (AP by transverse compare series 18, image 67 to previous series 18, image 79, image 77 now to previous image 85). And again the possibility of leptomeningeal disease along the margins of this lesion is raised (series 19, image 18). Ongoing confluent T2/FLAIR hyperintense edema surrounding this area, and throughout the left hemisphere - see additional details below. Seven additional chronic enhancing metastases scattered in both cerebral hemispheres are all slightly smaller since 07/04/2022, annotated on series 18. However, there is a new 3-4 mm round  focus of enhancement in the left centrum semiovale series 18, image 94. And there is also a faint new curvilinear area of enhancement in the left parietal lobe near the periatrial white matter series 18, image 79. No other pathologic dural thickening. Confluent T2 and FLAIR hyperintensity in the left hemisphere continues and has progressed in the left superior frontal gyrus and superior parietal lobe on series 15, image 41, slightly more confluent elsewhere. There is mild but increased mass effect on the left lateral ventricle, and there is now 3 mm of rightward midline shift at the anterior septum pellucidum (series 15, image 30). No ventriculomegaly. No definite ependymal enhancement. Basilar cisterns remain patent. No extra-axial collection or acute intracranial hemorrhage. Cervicomedullary junction and pituitary are within normal limits. Vascular: Major intracranial vascular flow voids are stable. The major dural venous sinuses are enhancing and appear to be patent. Skull and upper cervical spine: Bone marrow signal remains within normal limits. Negative visible cervical spine and spinal cord. Sinuses/Orbits: Similar rightward gaze deviation. Paranasal sinuses remain clear. Other: Right mastoid effusion has not significantly changed. Negative visible scalp and face. IMPRESSION: 1. Mixed response to therapy since April: -11 enhancing metastases in both cerebral and cerebellar hemispheres are all slightly smaller. And cerebellar edema has resolved. 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 - And there are one, possibly two subcentimeter NEW left hemisphere white matter metastases (series 18, images 79 and 94). 2. Ongoing extensive T2 and FLAIR hyperintensity most resembling tumoral edema in the left hemisphere, progressed in the superior left frontal and parietal lobes, and now associated with  3 mm of rightward midline shift. 3. Negative for acute infarct. Electronically Signed   By: Odessa Fleming M.D.   On: 08/31/2022 04:12   DG Abdomen 1 View  Result Date: 08/31/2022 CLINICAL DATA:  Pre MRI clearance EXAM: ABDOMEN - 1 VIEW COMPARISON:  04/24/2022 FINDINGS: Vertebral augmentation is again noted at L4 and L1 as well as within the right sacrum and iliac bone. Bladder is filled with contrast material. No obstructive changes are seen. Prior pinning in the right hip is noted. No radiopaque foreign body is noted. IMPRESSION: No acute abnormality noted.  No findings to preclude MRI scan. Electronically Signed   By: Alcide Clever M.D.   On: 08/31/2022 03:29   DG Chest Portable 1 View  Result Date: 08/31/2022 CLINICAL DATA:  Pre MRI clearance EXAM: PORTABLE CHEST 1 VIEW COMPARISON:  02/05/2021 FINDINGS: Cardiac shadow is stable. Right chest wall port is again seen and  stable. The overall inspiratory effort is poor. Changes of prior vertebral augmentation are noted in the midthoracic spine. IMPRESSION: No acute abnormality noted. No retained foreign body to preclude MRI scanning. Electronically Signed   By: Alcide Clever M.D.   On: 08/31/2022 03:28   CT ANGIO HEAD NECK W WO CM  Result Date: 08/31/2022 CLINICAL DATA:  Initial evaluation for neuro deficit, stroke suspected. EXAM: CT ANGIOGRAPHY HEAD AND NECK WITH AND WITHOUT CONTRAST TECHNIQUE: Multidetector CT imaging of the head and neck was performed using the standard protocol during bolus administration of intravenous contrast. Multiplanar CT image reconstructions and MIPs were obtained to evaluate the vascular anatomy. Carotid stenosis measurements (when applicable) are obtained utilizing NASCET criteria, using the distal internal carotid diameter as the denominator. RADIATION DOSE REDUCTION: This exam was performed according to the departmental dose-optimization program which includes automated exposure control, adjustment of the mA and/or kV according to  patient size and/or use of iterative reconstruction technique. CONTRAST:  75mL OMNIPAQUE IOHEXOL 350 MG/ML SOLN COMPARISON:  Comparison made with prior CT from 08/30/2022. FINDINGS: CTA NECK FINDINGS Aortic arch: Visualized aortic arch normal caliber with standard branch pattern. No stenosis about the origin the great vessels. Right carotid system: Right common and internal carotid arteries are patent without stenosis or dissection. Left carotid system: Left common and internal carotid arteries are patent without stenosis or dissection. Vertebral arteries: Both vertebral arteries arise from subclavian arteries. No proximal subclavian artery stenosis. Right vertebral artery slightly dominant. Vertebral arteries patent without stenosis or dissection. Skeleton: Few scattered abnormal sclerotic lesion seen involving the visualized osseous structures, consistent with known osseous metastatic disease. Associated mild pathologic compression deformity at the inferior endplate of C5 without retropulsion. Other neck: No other acute finding. Upper chest: No other acute finding. Review of the MIP images confirms the above findings CTA HEAD FINDINGS Anterior circulation: Both internal carotid arteries are widely patent to the termini without stenosis or other abnormality. Left A1 segment widely patent. Right A1 hypoplastic and/or absent. Normal anterior communicating complex. Anterior cerebral arteries deviated to the right but are widely patent without stenosis. No M1 stenosis or occlusion. No proximal MCA branch occlusion or high-grade stenosis. Distal MCA branches perfused and symmetric. Posterior circulation: Both V4 segments widely patent without stenosis. Both PICA patent. Basilar widely patent without stenosis. Superior cerebral arteries patent bilaterally. Left PCA primarily supplied via the basilar. Predominant fetal type origin of the right PCA. Both PCAs patent without stenosis. Venous sinuses: Patent allowing for  timing the contrast bolus. Anatomic variants: Hypoplastic/absent right A1. Mildly dominant right vertebral artery. No intracranial aneurysm. Vasogenic edema involving the left greater than right cerebral hemispheres related to known metastatic disease, better characterized on prior CT. Review of the MIP images confirms the above findings IMPRESSION: 1. Negative CTA of the head and neck. No large vessel occlusion or other emergent finding. No hemodynamically significant or correctable stenosis. 2. Intracranial metastatic disease with associated vasogenic edema and mild left-to-right shift. Findings are better characterized on prior exams. 3. Scattered osseous metastatic disease. Associated mild pathologic compression deformity at the inferior endplate of C5 without retropulsion. Electronically Signed   By: Rise Mu M.D.   On: 08/31/2022 02:21   CT HEAD WO CONTRAST ( )  Result Date: 08/30/2022 CLINICAL DATA:  Neuro deficit, acute, stroke suspected new aphasia, pt with known brain mets EXAM: CT HEAD WITHOUT CONTRAST TECHNIQUE: Contiguous axial images were obtained from the base of the skull through the vertex without intravenous contrast. RADIATION DOSE REDUCTION:  This exam was performed according to the departmental dose-optimization program which includes automated exposure control, adjustment of the mA and/or kV according to patient size and/or use of iterative reconstruction technique. COMPARISON:  06/12/2018, MRI 07/04/2022 FINDINGS: Brain: Extensive vasogenic edema throughout the left frontal lobe, extensive vasogenic edema throughout the cerebral hemispheres bilaterally, left greater than right, similar to prior study. The previously seen brain lesions by MRI not appreciated by noncontrast CT. No midline shift or hydrocephalus. No hemorrhage. Vascular: No hyperdense vessel or unexpected calcification. Skull: No acute calvarial abnormality. Sinuses/Orbits: No acute findings Other: None  IMPRESSION: Extensive vasogenic edema as seen on prior study and compatible with the known metastatic disease present on prior imaging. This appears similar to prior study. Previously seen individual metastatic lesions by prior MRI not well visualized on this noncontrast CT. No hemorrhage, hydrocephalus or midline shift. Electronically Signed   By: Charlett Nose M.D.   On: 08/30/2022 23:46    PERFORMANCE STATUS (ECOG) : 2 - Symptomatic, <50% confined to bed  Review of Systems Unless otherwise noted, a complete review of systems is negative.  Physical Exam General: NAD Pulmonary: unlabored Extremities: no edema, no joint deformities Skin: no rashes Neurological: Weakness, alert, some confusion  IMPRESSION: Follow-up visit.  Patient's mentation improved.  She still has some word searching and occasional periods of confusion but overall her comprehension is better.  Met with patient and oldest son.  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  We discussed importance of comfort and cancer treatment unlikely to provide her improved quality of life at this point.  Patient did seem interested in speaking with [name] so we discussed possible option of outpatient follow-up if needed.  Patient/family will let us know.  Plan today is to discharge her to a friend's home with hospice.  PLAN: -Home with hospice -Continue dexamethasone -Patient/family to let us know if they desire outpatient follow-up   Time Total: 25 minutes  Visit consisted of counseling and education dealing with the complex and emotionally intense issues of symptom management and palliative care in the setting of serious and potentially life-threatening illness.Greater than 50%  of this time was spent counseling and coordinating care related to the above assessment and plan.  Signed by: Laurette Schimke, PhD, NP-C

## 2022-09-13 NOTE — Discharge Summary (Signed)
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 [facility])   Breast cancer metastasized to brain [facility])   Neoplasm related pain   GERD (gastroesophageal reflux disease)   Depression with anxiety   Palliative care encounter   Admitted From: Home Disposition:  Home with Hospice services  Recommendations for Outpatient Follow-up:  PCP: in 1 wk F/u Hospice F/u with oncologist in 1 wk  Follow up LABS/TEST:     Diet recommendation: Regular diet  Activity: The patient is advised to gradually reintroduce usual activities, as tolerated  Discharge Condition: stable  Code Status: Full code   History of present illness: As per the H and P dictated on admission Hospital Course:  Sophia [name] is a 42 y.o. 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   Patient is from [place] and lives here with her 3 kids ages between 29-11.  No family support. ED Course: pt was found to have WBC 2.9, GFR> 60, temperature normal, blood pressure 112/73, heart rate of 83, RR 18, oxygen saturation 100% on room air. CTA of head and neck negative for LVO.  MRI-brain w/ and w/o contrast: 1. Mixed response to therapy since April: -11 enhancing metastases in both cerebral and cerebellar hemispheres are all slightly smaller. And cerebellar edema has resolved. 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 - And there  are one, possibly two subcentimeter NEW left hemisphere white matter metastases (series 18, images 79 and 94).  2. Ongoing extensive T2 and FLAIR hyperintensity most resembling tumoral edema in the left hemisphere, progressed in the superior left frontal and parietal lobes, and now associated with 3 mm of rightward midline shift. 3. Negative for acute infarct. CT-head: Extensive vasogenic edema as seen on prior study and compatible with the known metastatic disease present on prior imaging. This appears similar to prior study. EKG: Sinus rhythm, QTc 500, QT wave in lead III, low voltage, early R wave progression. Oncology and palliative care from cancer center is on board. 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   Assessment and Plan: # Aphasia, resolved  # Acute metabolic encephalopathy.  Resolved  Likely secondary to metastatic disease to brain.  Patient had a small midline shift and concern of some vasogenic edema. Her oncologist was consulted and according to  Dr. Smith [name], overall prognosis is poor.  There is no role for surgery or radiation. S/p IV Decadron and Keppra. S/p Decadron 6 mg p.o. daily and Keppra 500 mg p.o. twice daily. Seems to have some improvement and currently stable. Palliative care from cancer center is on board. Patient has 3 young kids with no family or community support locally.  Patient was discharged on low-dose Decadron 4 mg p.o. daily as per oncologist and recommended to follow-up as an outpatient. # Breast cancer metastasized to brain: S/p whole brain radiation. S/p chemotherapy, which was canceled. They will resume chemo if functional status improves, otherwise  she will be a candidate for hospice facility # Neoplasm related pain: Continue MS Contin 30 mg twice daily. As needed as needed Dilaudid and Percocet # Hypotension developed on 6/22 early morning, s/p IV fluid bolus was given, continued gentle hydration decreased LR 50 ml/hr. BP  improved patient was advised to continue oral hydration.   # Hypokalemia, potassium repleted.  Resolved  # GERD (gastroesophageal reflux disease) Continue omeprazole 40 mg p.o. daily # Depression with anxiety: Continue home meds    There is no height or weight on file to calculate BMI.  Nutrition Problem: Increased nutrient needs Etiology: cancer and cancer related treatments Nutrition Interventions: Interventions: Ensure Enlive (each supplement provides 350kcal and 20 grams of protein), MVI  - Patient was instructed, not to drive, operate heavy machinery, perform activities at heights, swimming or participation in water activities or provide baby sitting services while on Pain, Sleep and Anxiety Medications; until her outpatient Physician has advised to do so again.  - Also recommended to not to take more than prescribed Pain, Sleep and Anxiety Medications.  Patient was ambulatory without any assistance. On the day of the discharge the patient's vitals were stable, and no other acute medical condition were reported by patient. the patient was felt safe to be discharge at Home with hospice services.  Consultants: Oncologist, palliative care and hospice services  Procedures: None  Discharge Exam: General: Appear in no distress, no Rash; Oral Mucosa Clear, moist. Cardiovascular: S1 and S2 Present, no Murmur, Respiratory: normal respiratory effort, Bilateral Air entry present and no Crackles, no wheezes Abdomen: Bowel Sound present, Soft and no tenderness, no hernia Extremities: no Pedal edema, no calf tenderness Neurology: alert and oriented to time, place, and person affect appropriate.  There were no vitals filed for this visit. Vitals:   09/13/22 0551 09/13/22 0823  BP: 107/72 (!) 98/57  Pulse: (!) 58 (!) 55  Resp: 18 17  Temp: 98.3 F (36.8 C) 98.1 F (36.7 C)  SpO2: 98% 96%    DISCHARGE MEDICATION: Allergies as of 09/13/2022   No Known Allergies      Medication List      STOP taking these medications    nitrofurantoin (macrocrystal-monohydrate) 100 MG capsule Commonly known as: Macrobid       TAKE these medications    Calcium + Vitamin D3 600-10 MG-MCG Tabs Generic drug: Calcium Carb-Cholecalciferol Take 1 tablet by mouth 2 (two) times daily.   dexamethasone 4 MG tablet Commonly known as: DECADRON Tome 1 tableta (4 mg en total) por va oral diariamente. (Take 1 tablet (4 mg total) by mouth daily.) Start taking on: September 14, 2022   DULoxetine 60 MG capsule Commonly known as: CYMBALTA Tome 1 cpsula (60 mg en total) por va oral diariamente. (Take 1 capsule (60 mg total) by mouth daily.)   gabapentin 600 MG tablet Commonly known as: Neurontin Take 1 tablet (600 mg total) by mouth 3 (three) times daily.   HYDROmorphone 2 MG tablet Commonly known as: Dilaudid Take 0.5-1 tablets (1-2 mg total) by mouth every 4 (four) hours as needed for severe pain.   levETIRAcetam 500 MG tablet Commonly known as: KEPPRA Take 1 tablet (500 mg total) by mouth 2 (two) times daily.   lidocaine-prilocaine cream Commonly known as: EMLA Apply 1 Application topically as needed. Apply small amount to port site at least 1 hour prior to it being accessed, cover with plastic wrap   loratadine 10 MG tablet Commonly known as: Claritin Take 1 tablet (10  mg total) by mouth daily. For seasonal allergies   LORazepam 0.5 MG tablet Commonly known as: ATIVAN Take 1 tablet by mouth every 6 (six) hours as needed for anxiety and nausea (Take 1 tablet (0.5 mg total) by mouth every 6 (six) hours as needed for anxiety (and nausea). AS NEEDED FOR NAUSEA)   morphine 30 MG 12 hr tablet Commonly known as: MS CONTIN Take 1 tablet (30 mg total) by mouth every 12 (twelve) hours.   OLANZapine 10 MG tablet Commonly known as: ZYPREXA Take 1 tablet (10 mg total) by mouth at bedtime as needed (nausea).   omeprazole 40 MG capsule Commonly known as: PRILOSEC Tome 1 cpsula (40  mg en total) por va oral diariamente. (Take 1 capsule (40 mg total) by mouth daily.) What changed:  medication strength how much to take   ondansetron 8 MG tablet Commonly known as: ZOFRAN Take 1 tablet (8 mg total) by mouth every 8 (eight) hours as needed for nausea or vomiting.       No Known Allergies Discharge Instructions     Call MD for:  difficulty breathing, headache or visual disturbances   Complete by: As directed    Call MD for:  extreme fatigue   Complete by: As directed    Call MD for:  persistant dizziness or light-headedness   Complete by: As directed    Call MD for:  persistant nausea and vomiting   Complete by: As directed    Call MD for:  severe uncontrolled pain   Complete by: As directed    Call MD for:  temperature >100.4   Complete by: As directed    Diet - low sodium heart healthy   Complete by: As directed    Discharge instructions   Complete by: As directed    F/u Hospice F/u PCP and Oncologist   Increase activity slowly   Complete by: As directed        The results of significant diagnostics from this hospitalization (including imaging, microbiology, ancillary and laboratory) are listed below for reference.    Significant Diagnostic Studies: MR Brain W and Wo Contrast  Result Date: 08/31/2022 CLINICAL DATA:  42 year old female with metastatic breast cancer. Treated initially with whole brain radiation in late 2022, and repeat salvage whole brain XRT in October 2023. Vasogenic edema. Neurologic deficit. EXAM: MRI HEAD WITHOUT AND WITH CONTRAST TECHNIQUE: Multiplanar, multiecho pulse sequences of the brain and surrounding structures were obtained without and with intravenous contrast. CONTRAST:  5mL GADAVIST GADOBUTROL 1 MMOL/ML IV SOLN COMPARISON:  CTA head and neck 0041 hours today. Most recent restaging brain MRI 07/04/2022. FINDINGS: Brain: No restricted diffusion or evidence of acute infarction. Four cerebellar enhancing brain metastases are  all slightly smaller since 07/04/2022, ranging from punctate (right cerebellum series 18, image 21) up to 13 mm (left posterior cerebellum series 18, image 18). All lesions annotated on series 18. And cerebellar edema has resolved since 07/04/2022. Irregular, stellate and rim enhancing lesion at the left frontal lobe anterior operculum has enlarged since April, now 25 by 24 mm (AP by transverse compare series 18, image 67 to previous series 18, image 79, image 77 now to previous image 85). And again the possibility of leptomeningeal disease along the margins of this lesion is raised (series 19, image 18). Ongoing confluent T2/FLAIR hyperintense edema surrounding this area, and throughout the left hemisphere - see additional details below. Seven additional chronic enhancing metastases scattered in both cerebral hemispheres are all slightly smaller since  07/04/2022, annotated on series 18. However, there is a new 3-4 mm round focus of enhancement in the left centrum semiovale series 18, image 94. And there is also a faint new curvilinear area of enhancement in the left parietal lobe near the periatrial white matter series 18, image 79. No other pathologic dural thickening. Confluent T2 and FLAIR hyperintensity in the left hemisphere continues and has progressed in the left superior frontal gyrus and superior parietal lobe on series 15, image 41, slightly more confluent elsewhere. There is mild but increased mass effect on the left lateral ventricle, and there is now 3 mm of rightward midline shift at the anterior septum pellucidum (series 15, image 30). No ventriculomegaly. No definite ependymal enhancement. Basilar cisterns remain patent. No extra-axial collection or acute intracranial hemorrhage. Cervicomedullary junction and pituitary are within normal limits. Vascular: Major intracranial vascular flow voids are stable. The major dural venous sinuses are enhancing and appear to be patent. Skull and upper cervical  spine: Bone marrow signal remains within normal limits. Negative visible cervical spine and spinal cord. Sinuses/Orbits: Similar rightward gaze deviation. Paranasal sinuses remain clear. Other: Right mastoid effusion has not significantly changed. Negative visible scalp and face. IMPRESSION: 1. Mixed response to therapy since April: -11 enhancing metastases in both cerebral and cerebellar hemispheres are all slightly smaller. And cerebellar edema has resolved. 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 - And there are one, possibly two subcentimeter NEW left hemisphere white matter metastases (series 18, images 79 and 94). 2. Ongoing extensive T2 and FLAIR hyperintensity most resembling tumoral edema in the left hemisphere, progressed in the superior left frontal and parietal lobes, and now associated with 3 mm of rightward midline shift. 3. Negative for acute infarct. Electronically Signed   By: Odessa Fleming M.D.   On: 08/31/2022 04:12   DG Abdomen 1 View  Result Date: 08/31/2022 CLINICAL DATA:  Pre MRI clearance EXAM: ABDOMEN - 1 VIEW COMPARISON:  04/24/2022 FINDINGS: Vertebral augmentation is again noted at L4 and L1 as well as within the right sacrum and iliac bone. Bladder is filled with contrast material. No obstructive changes are seen. Prior pinning in the right hip is noted. No radiopaque foreign body is noted. IMPRESSION: No acute abnormality noted.  No findings to preclude MRI scan. Electronically Signed   By: Alcide Clever M.D.   On: 08/31/2022 03:29   DG Chest Portable 1 View  Result Date: 08/31/2022 CLINICAL DATA:  Pre MRI clearance EXAM: PORTABLE CHEST 1 VIEW COMPARISON:  02/05/2021 FINDINGS: Cardiac shadow is stable. Right chest wall port is again seen and stable. The overall inspiratory effort is poor. Changes of prior vertebral augmentation are noted in the midthoracic spine.  IMPRESSION: No acute abnormality noted. No retained foreign body to preclude MRI scanning. Electronically Signed   By: Alcide Clever M.D.   On: 08/31/2022 03:28   CT ANGIO HEAD NECK W WO CM  Result Date: 08/31/2022 CLINICAL DATA:  Initial evaluation for neuro deficit, stroke suspected. EXAM: CT ANGIOGRAPHY HEAD AND NECK WITH AND WITHOUT CONTRAST TECHNIQUE: Multidetector CT imaging of the head and neck was performed using the standard protocol during bolus administration of intravenous contrast. Multiplanar CT image reconstructions and MIPs were obtained to evaluate the vascular anatomy. Carotid stenosis measurements (when applicable) are obtained utilizing NASCET criteria, using the distal internal carotid diameter as the denominator. RADIATION DOSE REDUCTION: This  exam was performed according to the departmental dose-optimization program which includes automated exposure control, adjustment of the mA and/or kV according to patient size and/or use of iterative reconstruction technique. CONTRAST:  75mL OMNIPAQUE IOHEXOL 350 MG/ML SOLN COMPARISON:  Comparison made with prior CT from 08/30/2022. FINDINGS: CTA NECK FINDINGS Aortic arch: Visualized aortic arch normal caliber with standard branch pattern. No stenosis about the origin the great vessels. Right carotid system: Right common and internal carotid arteries are patent without stenosis or dissection. Left carotid system: Left common and internal carotid arteries are patent without stenosis or dissection. Vertebral arteries: Both vertebral arteries arise from subclavian arteries. No proximal subclavian artery stenosis. Right vertebral artery slightly dominant. Vertebral arteries patent without stenosis or dissection. Skeleton: Few scattered abnormal sclerotic lesion seen involving the visualized osseous structures, consistent with known osseous metastatic disease. Associated mild pathologic compression deformity at the inferior endplate of C5 without  retropulsion. Other neck: No other acute finding. Upper chest: No other acute finding. Review of the MIP images confirms the above findings CTA HEAD FINDINGS Anterior circulation: Both internal carotid arteries are widely patent to the termini without stenosis or other abnormality. Left A1 segment widely patent. Right A1 hypoplastic and/or absent. Normal anterior communicating complex. Anterior cerebral arteries deviated to the right but are widely patent without stenosis. No M1 stenosis or occlusion. No proximal MCA branch occlusion or high-grade stenosis. Distal MCA branches perfused and symmetric. Posterior circulation: Both V4 segments widely patent without stenosis. Both PICA patent. Basilar widely patent without stenosis. Superior cerebral arteries patent bilaterally. Left PCA primarily supplied via the basilar. Predominant fetal type origin of the right PCA. Both PCAs patent without stenosis. Venous sinuses: Patent allowing for timing the contrast bolus. Anatomic variants: Hypoplastic/absent right A1. Mildly dominant right vertebral artery. No intracranial aneurysm. Vasogenic edema involving the left greater than right cerebral hemispheres related to known metastatic disease, better characterized on prior CT. Review of the MIP images confirms the above findings IMPRESSION: 1. Negative CTA of the head and neck. No large vessel occlusion or other emergent finding. No hemodynamically significant or correctable stenosis. 2. Intracranial metastatic disease with associated vasogenic edema and mild left-to-right shift. Findings are better characterized on prior exams. 3. Scattered osseous metastatic disease. Associated mild pathologic compression deformity at the inferior endplate of C5 without retropulsion. Electronically Signed   By: Rise Mu M.D.   On: 08/31/2022 02:21   CT HEAD WO CONTRAST ( )  Result Date: 08/30/2022 CLINICAL DATA:  Neuro deficit, acute, stroke suspected new aphasia, pt with  known brain mets EXAM: CT HEAD WITHOUT CONTRAST TECHNIQUE: Contiguous axial images were obtained from the base of the skull through the vertex without intravenous contrast. RADIATION DOSE REDUCTION: This exam was performed according to the departmental dose-optimization program which includes automated exposure control, adjustment of the mA and/or kV according to patient size and/or use of iterative reconstruction technique. COMPARISON:  06/12/2018, MRI 07/04/2022 FINDINGS: Brain: Extensive vasogenic edema throughout the left frontal lobe, extensive vasogenic edema throughout the cerebral hemispheres bilaterally, left greater than right, similar to prior study. The previously seen brain lesions by MRI not appreciated by noncontrast CT. No midline shift or hydrocephalus. No hemorrhage. Vascular: No hyperdense vessel or unexpected calcification. Skull: No acute calvarial abnormality. Sinuses/Orbits: No acute findings Other: None IMPRESSION: Extensive vasogenic edema as seen on prior study and compatible with the known metastatic disease present on prior imaging. This appears similar to prior study. Previously seen individual metastatic lesions by prior MRI not well  visualized on this noncontrast CT. No hemorrhage, hydrocephalus or midline shift. Electronically Signed   By: Charlett Nose M.D.   On: 08/30/2022 23:46    Microbiology: No results found for this or any previous visit (from the past 240 hour(s)).   Labs: CBC: Recent Labs  Lab 09/11/22 0441  WBC 15.7*  HGB 11.2*  HCT 35.0*  MCV 87.1  PLT 299   Basic Metabolic Panel: Recent Labs  Lab 09/09/22 0025 09/11/22 0441  NA 139 139  K 2.9* 4.1  CL 108 108  CO2 24 22  GLUCOSE 117* 111*  BUN <5* 7  CREATININE 0.54 0.51  CALCIUM 8.4* 8.7*  MG 2.2 2.2  PHOS 3.7 3.8   Liver Function Tests: Recent Labs  Lab 09/09/22 0025  AST 31  ALT 26  ALKPHOS 54  BILITOT 0.3  PROT 5.2*  ALBUMIN 2.8*   No results for input(s): "LIPASE", "AMYLASE"  in the last 168 hours. No results for input(s): "AMMONIA" in the last 168 hours. Cardiac Enzymes: No results for input(s): "CKTOTAL", "CKMB", "CKMBINDEX", "TROPONINI" in the last 168 hours. BNP (last 3 results) No results for input(s): "BNP" in the last 8760 hours. CBG: Recent Labs  Lab 09/10/22 0732 09/11/22 0727 09/12/22 1219 09/12/22 1643 09/13/22 0825  GLUCAP 95 83 107* 178* 86    Time spent: 35 minutes  Signed:  Gillis Santa  [facility] 09/13/2022  9:18 PM

## 2022-09-13 NOTE — TOC Transition Note (Addendum)
Transition of Care [facility]) - CM/SW Discharge Note   Patient Details  Name: Sophia [name] MRN: [id number] Date of Birth: 03/13/1981  [facility] CM/SW Contact:  Allena Katz, LCSW Phone Number: 09/13/2022, 12:06 PM   Clinical Narrative:    Pt has orders to discharge home with Home hospice through [facility]. No DME needs at this time. Pt temporarily staying with friend Kara Mead until she is able to go back home when her mom arrives in the [place] on July 4th. Son will transport around 1pm. No additional needs at this time. CPS worker August Saucer [phone] notified and given discharge address.   Ree Kida with [facility] confirmed discharge location below with emma.   [facility] number is [phone] [place], [place] [address]    Kara Mead [phone].   Final next level of care: Home w Hospice Care Barriers to Discharge: Barriers Resolved   Patient Goals and CMS Choice CMS Medicare.gov Compare Post Acute Care list provided to:: Patient Choice offered to / list presented to : Patient  Discharge Placement                    Name of family member notified: son Patient and family notified of of transfer: 09/13/22  Discharge Plan and Services Additional resources added to the After Visit Summary for                                       Social Determinants of Health (SDOH) Interventions SDOH Screenings   Food Insecurity: No Food Insecurity (08/31/2022)  Recent Concern: Food Insecurity - Food Insecurity Present (06/23/2022)  Housing: Low Risk  (08/31/2022)  Recent Concern: Housing - High Risk (06/23/2022)  Transportation Needs: No Transportation Needs (08/31/2022)  Recent Concern: Transportation Needs - Unmet Transportation Needs (07/21/2022)  Utilities: Not At Risk (08/31/2022)  Alcohol Screen: Low Risk  (11/18/2021)  Depression (PHQ2-9): Medium Risk (06/23/2022)  Financial Resource Strain: High Risk (11/18/2021)  Physical Activity: Inactive (11/18/2021)  Social Connections:  Socially Isolated (06/23/2022)  Stress: Stress Concern Present (11/18/2021)  Tobacco Use: Low Risk  (08/31/2022)     Readmission Risk Interventions     No data to display

## 2022-09-13 NOTE — Progress Notes (Signed)
[facility]- Civil engineer, contracting [facility])  Referrals team at [facility] informed of dc to friend's home today.  Referrals team provided with the address and phone number of the friend's home. Friend was receptive to Hospice following the patient in her home.     Please don't hesitate to call with any Hospice related questions or concerns.    Thank you for the opportunity to participate in this patient's care.  [facility] Liaison [phone]

## 2022-09-15 ENCOUNTER — Other Ambulatory Visit: Payer: Self-pay

## 2022-09-20 ENCOUNTER — Other Ambulatory Visit: Payer: Self-pay

## 2022-09-22 ENCOUNTER — Ambulatory Visit: Payer: Self-pay

## 2022-09-22 ENCOUNTER — Other Ambulatory Visit: Payer: Self-pay

## 2022-09-22 ENCOUNTER — Ambulatory Visit: Payer: Self-pay | Admitting: Oncology

## 2022-09-26 ENCOUNTER — Emergency Department: Payer: Self-pay

## 2022-09-26 ENCOUNTER — Other Ambulatory Visit: Payer: Self-pay

## 2022-09-26 ENCOUNTER — Emergency Department
Admission: EM | Admit: 2022-09-26 | Discharge: 2022-09-26 | Disposition: A | Discharge status: Home / Self Care | Payer: Self-pay | Attending: Emergency Medicine | Admitting: Emergency Medicine

## 2022-09-26 ENCOUNTER — Encounter: Payer: Self-pay | Admitting: Oncology

## 2022-09-26 DIAGNOSIS — R109 Unspecified abdominal pain: Secondary | ICD-10-CM

## 2022-09-26 DIAGNOSIS — R0602 Shortness of breath: Secondary | ICD-10-CM | POA: Insufficient documentation

## 2022-09-26 DIAGNOSIS — R112 Nausea with vomiting, unspecified: Secondary | ICD-10-CM | POA: Insufficient documentation

## 2022-09-26 DIAGNOSIS — R1011 Right upper quadrant pain: Secondary | ICD-10-CM | POA: Insufficient documentation

## 2022-09-26 DIAGNOSIS — R079 Chest pain, unspecified: Secondary | ICD-10-CM | POA: Insufficient documentation

## 2022-09-26 DIAGNOSIS — Z853 Personal history of malignant neoplasm of breast: Secondary | ICD-10-CM | POA: Insufficient documentation

## 2022-09-26 LAB — COMPREHENSIVE METABOLIC PANEL
ALT: 21 U/L (ref 0–44)
AST: 21 U/L (ref 15–41)
Albumin: 3.4 g/dL — ABNORMAL LOW (ref 3.5–5.0)
Alkaline Phosphatase: 120 U/L (ref 38–126)
Anion gap: 10 (ref 5–15)
BUN: 6 mg/dL (ref 6–20)
CO2: 25 mmol/L (ref 22–32)
Calcium: 9 mg/dL (ref 8.9–10.3)
Chloride: 102 mmol/L (ref 98–111)
Creatinine, Ser: 0.45 mg/dL (ref 0.44–1.00)
GFR, Estimated: >60 mL/min (ref >60)
Glucose, Bld: 94 mg/dL (ref 70–99)
Potassium: 3.6 mmol/L (ref 3.5–5.1)
Sodium: 137 mmol/L (ref 135–145)
Total Bilirubin: 0.6 mg/dL (ref 0.3–1.2)
Total Protein: 7.3 g/dL (ref 6.5–8.1)

## 2022-09-26 LAB — CBC WITH DIFFERENTIAL/PLATELET
Abs Immature Granulocytes: 0.06 10*3/uL (ref 0.00–0.07)
Basophils Absolute: 0 10*3/uL (ref 0.0–0.1)
Basophils Relative: 1 %
Eosinophils Absolute: 0.4 10*3/uL (ref 0.0–0.5)
Eosinophils Relative: 8 %
HCT: 38.1 % (ref 36.0–46.0)
Hemoglobin: 12.1 g/dL (ref 12.0–15.0)
Immature Granulocytes: 1 %
Lymphocytes Relative: 23 %
Lymphs Abs: 1.2 10*3/uL (ref 0.7–4.0)
MCH: 28.3 pg (ref 26.0–34.0)
MCHC: 31.8 g/dL (ref 30.0–36.0)
MCV: 89.2 fL (ref 80.0–100.0)
Monocytes Absolute: 0.3 10*3/uL (ref 0.1–1.0)
Monocytes Relative: 6 %
Neutro Abs: 3.4 10*3/uL (ref 1.7–7.7)
Neutrophils Relative %: 61 %
Platelets: 408 10*3/uL — ABNORMAL HIGH (ref 150–400)
RBC: 4.27 MIL/uL (ref 3.87–5.11)
RDW: 16.1 % — ABNORMAL HIGH (ref 11.5–15.5)
WBC: 5.5 10*3/uL (ref 4.0–10.5)
nRBC: 0 % (ref 0.0–0.2)

## 2022-09-26 LAB — LIPASE, BLOOD: Lipase: 48 U/L (ref 11–51)

## 2022-09-26 LAB — TROPONIN I (HIGH SENSITIVITY): Troponin I (High Sensitivity): 3 ng/L (ref <18)

## 2022-09-26 MED ORDER — SUCRALFATE 1 G PO TABS
1.0000 g | ORAL_TABLET | Freq: Four times a day (QID) | ORAL | 0 refills | Status: DC
  Filled 2022-09-26: qty 60, 15d supply, fill #0

## 2022-09-26 MED ORDER — DICYCLOMINE HCL 10 MG PO CAPS
10.0000 mg | ORAL_CAPSULE | Freq: Three times a day (TID) | ORAL | 0 refills | Status: DC | PRN
  Filled 2022-09-26: qty 20, 7d supply, fill #0

## 2022-09-26 MED ORDER — IOHEXOL 350 MG/ML SOLN
75.0000 mL | Freq: Once | INTRAVENOUS | Status: AC | PRN
  Administered 2022-09-26: 75 mL via INTRAVENOUS

## 2022-09-26 MED ORDER — SODIUM CHLORIDE 0.9 % IV BOLUS
1000.0000 mL | Freq: Once | INTRAVENOUS | Status: AC
  Administered 2022-09-26: 1000 mL via INTRAVENOUS

## 2022-09-26 MED ORDER — ONDANSETRON HCL 4 MG/2ML IJ SOLN
4.0000 mg | Freq: Once | INTRAMUSCULAR | Status: AC
  Administered 2022-09-26: 4 mg via INTRAVENOUS
  Filled 2022-09-26: qty 2

## 2022-09-26 MED ORDER — MORPHINE SULFATE (PF) 4 MG/ML IV SOLN
4.0000 mg | Freq: Once | INTRAVENOUS | Status: AC
  Administered 2022-09-26: 4 mg via INTRAVENOUS
  Filled 2022-09-26: qty 1

## 2022-09-26 NOTE — ED Provider Notes (Signed)
[facility] Provider Note    Event Date/Time   First MD Initiated Contact with Patient 09/26/22 [phone]     (approximate)   History   Chest Pain and Shortness of Breath   HPI  Sophia [name] is a 42 y.o. female  who presents to the emergency department today because of concern for abdominal pain. Located in her right upper and mid abdomen. Pain started last night. Has had some shortness of breath. Pain is worse with deep breaths. Also had some nausea and vomiting. The patient denies any fevers. Denies similar pain in the past. Has history of metestatic breast cancer.       Physical Exam   Triage Vital Signs: ED Triage Vitals  Enc Vitals Group     BP 09/26/22 0529 (!) 121/100     Pulse Rate 09/26/22 0529 90     Resp 09/26/22 0529 16     Temp 09/26/22 0529 98.7 F (37.1 C)     Temp Source 09/26/22 0529 Oral     SpO2 09/26/22 0529 100 %     Weight 09/26/22 0531 120 lb (54.4 kg)     Height 09/26/22 0531 5' (1.524 m)     Head Circumference --      Peak Flow --      Pain Score 09/26/22 0535 10     Pain Loc --      Pain Edu? --      Excl. in GC? --     Most recent vital signs: Vitals:   09/26/22 0529  BP: (!) 121/100  Pulse: 90  Resp: 16  Temp: 98.7 F (37.1 C)  SpO2: 100%   General: Awake, alert, oriented. CV:  Good peripheral perfusion. Regular rate and rhythm. Resp:  Normal effort. Lungs clear. Abd:  No distention. Non tender.    ED Results / Procedures / Treatments   Labs (all labs ordered are listed, but only abnormal results are displayed) Labs Reviewed  CBC WITH DIFFERENTIAL/PLATELET - Abnormal; Notable for the following components:      Result Value   RDW 16.1 (*)    Platelets 408 (*)    All other components within normal limits  COMPREHENSIVE METABOLIC PANEL - Abnormal; Notable for the following components:   Albumin 3.4 (*)    All other components within normal limits  LIPASE, BLOOD  URINALYSIS, ROUTINE W REFLEX  MICROSCOPIC  TROPONIN I (HIGH SENSITIVITY)  TROPONIN I (HIGH SENSITIVITY)     EKG  I, Phineas Semen, attending physician, personally viewed and interpreted this EKG  EKG Time: 0533 Rate: 87 Rhythm: normal sinus rhythm Axis: normal Intervals: qtc 438 QRS: narrow, q waves v1, III ST changes: no st elevation Impression: abnormal ekg   RADIOLOGY I independently interpreted and visualized the CXR. My interpretation: No pneumonia Radiology interpretation:  IMPRESSION:  Very low lung volumes with atelectasis. No other acute  cardiopulmonary abnormality identified.   I independently interpreted and visualized the CT angio PE. My interpretation: No large PE Radiology interpretation:  IMPRESSION:  1. No pulmonary emboli or other acute chest pathology.  2. Poor inspiration.  3. Old augmented compression fracture of T6.  4. Small sclerotic foci elsewhere throughout the region are stable.  No evidence of lytic destructive metastasis.    Aortic Atherosclerosis (ICD10-I70.0).    I independently interpreted and visualized the CT abd/pel. My interpretation: No free air Radiology interpretation:  IMPRESSION:  1. No acute finding by CT. No evidence of rib fracture or  metastatic  lesion to explain right-sided chest and right upper quadrant pain.  2. Previously augmented fractures at L1 and L4, with  methylmethacrylate encroachment upon the spinal canal at L4 as seen  previously.  3. Scattered small sclerotic foci elsewhere within the skeleton are  stable. Methylmethacrylate augmentation of right sacral and iliac  lesions as seen previously. Gamma nail treatment of the right femur  as seen previously.     PROCEDURES:  Critical Care performed: No  MEDICATIONS ORDERED IN ED: Medications - No data to display   IMPRESSION / MDM / ASSESSMENT AND PLAN / ED COURSE  I reviewed the triage vital signs and the nursing notes.                              Differential diagnosis  includes, but is not limited to, PE, intraabdominal infection, ulcerative disease.  Patient's presentation is most consistent with acute presentation with potential threat to life or bodily function.   Patient presents to the emergency department because of concern for right sided abdominal pain. On exam patient without any significant tenderness.  CT scan of the abdomen pelvis as well as CT evaluating for PE were obtained.  These did not show any acute findings.  This time I do think patient is wandering more from ulcerative type disease or gastroenteritis.  Do think is reasonable for patient be discharged home.  Will give patient prescription for medication to help with symptoms.  Will give patient dietary guidelines.      FINAL CLINICAL IMPRESSION(S) / ED DIAGNOSES   Final diagnoses:  Abdominal pain, unspecified abdominal location     Note:  This document was prepared using Dragon voice recognition software and may include unintentional dictation errors.    Phineas Semen, MD 09/26/22 1006

## 2022-09-26 NOTE — ED Notes (Signed)
See triage note  Presents with generalized stomach pain   some nausea  States sx's started this am around 5  Afebrile on arrival

## 2022-09-26 NOTE — Discharge Instructions (Signed)
Please seek medical attention for any high fevers, chest pain, shortness of breath, change in behavior, persistent vomiting, bloody stool or any other new or concerning symptoms.  

## 2022-09-26 NOTE — ED Notes (Signed)
Rainbow sent to the lab at this time.  

## 2022-09-26 NOTE — ED Triage Notes (Signed)
Pt presents to ER acc by her son with c/o epigastric pain and SOB that started yesterday PM.  Pt states pain radiates into her RUQ.  Pt endorses some nausea, but denies vomiting or diarrhea.  Pt is a cancer pt on hospice, and states she has run out of all of her nausea meds. Pt is not currently receiving chemo.  Pt is otherwise A&O x4 and in NAD.

## 2022-09-27 ENCOUNTER — Other Ambulatory Visit: Payer: Self-pay

## 2022-10-02 ENCOUNTER — Encounter: Payer: Self-pay | Admitting: Oncology

## 2022-10-23 ENCOUNTER — Other Ambulatory Visit: Payer: Self-pay

## 2022-10-25 ENCOUNTER — Other Ambulatory Visit: Payer: Self-pay

## 2022-11-01 ENCOUNTER — Other Ambulatory Visit: Payer: Self-pay

## 2022-11-22 ENCOUNTER — Other Ambulatory Visit: Payer: Self-pay

## 2022-11-30 ENCOUNTER — Encounter: Payer: Self-pay | Admitting: Oncology

## 2023-01-12 ENCOUNTER — Other Ambulatory Visit: Payer: Self-pay

## 2023-03-21 DEATH — deceased
